# Patient Record
Sex: Male | Born: 1975 | Race: White | Hispanic: No | Marital: Married | Smoking: Former smoker
Health system: Southern US, Community
[De-identification: ages and names within clinical notes are randomized; demographics above are authoritative.]

## PROBLEM LIST (undated history)

## (undated) DIAGNOSIS — I10 Essential (primary) hypertension: Secondary | ICD-10-CM

## (undated) DIAGNOSIS — D649 Anemia, unspecified: Secondary | ICD-10-CM

## (undated) DIAGNOSIS — F411 Generalized anxiety disorder: Secondary | ICD-10-CM

## (undated) DIAGNOSIS — IMO0002 Reserved for concepts with insufficient information to code with codable children: Secondary | ICD-10-CM

## (undated) DIAGNOSIS — H3500 Unspecified background retinopathy: Secondary | ICD-10-CM

## (undated) DIAGNOSIS — E1165 Type 2 diabetes mellitus with hyperglycemia: Secondary | ICD-10-CM

## (undated) DIAGNOSIS — Z8489 Family history of other specified conditions: Secondary | ICD-10-CM

## (undated) DIAGNOSIS — E119 Type 2 diabetes mellitus without complications: Secondary | ICD-10-CM

## (undated) DIAGNOSIS — R519 Headache, unspecified: Secondary | ICD-10-CM

## (undated) DIAGNOSIS — Z992 Dependence on renal dialysis: Secondary | ICD-10-CM

## (undated) DIAGNOSIS — N179 Acute kidney failure, unspecified: Secondary | ICD-10-CM

## (undated) DIAGNOSIS — E785 Hyperlipidemia, unspecified: Secondary | ICD-10-CM

## (undated) DIAGNOSIS — H3530 Unspecified macular degeneration: Secondary | ICD-10-CM

## (undated) DIAGNOSIS — J189 Pneumonia, unspecified organism: Secondary | ICD-10-CM

## (undated) DIAGNOSIS — N186 End stage renal disease: Secondary | ICD-10-CM

## (undated) DIAGNOSIS — K3184 Gastroparesis: Secondary | ICD-10-CM

## (undated) HISTORY — DX: Type 2 diabetes mellitus without complications: E11.9

## (undated) HISTORY — DX: Type 2 diabetes mellitus with hyperglycemia: E11.65

## (undated) HISTORY — DX: Generalized anxiety disorder: F41.1

## (undated) HISTORY — PX: APPENDECTOMY: SHX54

## (undated) HISTORY — DX: Reserved for concepts with insufficient information to code with codable children: IMO0002

## (undated) HISTORY — PX: HERNIA REPAIR: SHX51

## (undated) HISTORY — DX: Hyperlipidemia, unspecified: E78.5

---

## 2018-04-18 ENCOUNTER — Encounter: Payer: Self-pay | Admitting: Physician Assistant

## 2018-04-18 ENCOUNTER — Ambulatory Visit (INDEPENDENT_AMBULATORY_CARE_PROVIDER_SITE_OTHER): Payer: 59 | Admitting: Physician Assistant

## 2018-04-18 ENCOUNTER — Other Ambulatory Visit: Payer: Self-pay

## 2018-04-18 VITALS — BP 138/82 | HR 101 | Temp 98.8°F | Resp 16 | Ht 70.08 in | Wt 180.0 lb

## 2018-04-18 DIAGNOSIS — J069 Acute upper respiratory infection, unspecified: Secondary | ICD-10-CM

## 2018-04-18 MED ORDER — BENZONATATE 100 MG PO CAPS: 100.0000 mg | ORAL_CAPSULE | Freq: Three times a day (TID) | ORAL | 0 refills | Status: DC | PRN

## 2018-04-18 MED ORDER — AZELASTINE HCL 0.1 % NA SOLN: 2.0000 | Freq: Two times a day (BID) | NASAL | 0 refills | Status: DC

## 2018-04-18 MED ORDER — DOXYCYCLINE HYCLATE 100 MG PO CAPS: 100.0000 mg | ORAL_CAPSULE | Freq: Two times a day (BID) | ORAL | 0 refills | Status: DC

## 2018-04-18 MED ORDER — HYDROCODONE-HOMATROPINE 5-1.5 MG/5ML PO SYRP: 5.0000 mL | ORAL_SOLUTION | Freq: Three times a day (TID) | ORAL | 0 refills | Status: DC | PRN

## 2018-04-18 NOTE — Patient Instructions (Addendum)
- We will treat this as a respiratory viral infection.  - I recommend you rest, drink plenty of fluids, eat light meals including soups.  - You may use cough syrup at night for your cough and sore throat, Tessalon pearls during the day. Be aware that cough syrup can definitely make you drowsy and sleepy so do not drive or operate any heavy machinery if it is affecting you during the day.  -You may use nasal spray for congestion, start daily nasal saline rinses (can get this over the counter), and may use sudafed 60mg  twice daily (can get this over the counter) - You may also use Tylenol or ibuprofen over-the-counter for your sore throat. Tea recipe for sore throat: boil water, add 2 inches shaved ginger root, steep 15 minutes, add juice from 2 full lemons, and 2 tbsp honey. -Due to duration, if you are not seeing improvement in 2-3 days, I have given you an antibiotic to start.  While taking Doxycycline:  -Do not drink milk or take iron supplements, multivitamins, calcium supplements, antacids, laxatives within 2 hours before or after taking doxycycline. -Avoid direct exposure to sunlight or tanning beds. Doxycycline can make you sunburn more easily. Wear protective clothing and use sunscreen (SPF 30 or higher) when you are outdoors. -Antibiotic medicines can cause diarrhea, which may be a sign of a new infection. If you have diarrhea that is watery or bloody, stop taking this medicine and seek medical care.  - Please let me know if you are not seeing any improvement or get worse.   Return in 2 weeks for complete physical exam, we will obtain fasting lab work at that visit.   Sinusitis, Adult Sinusitis is soreness and inflammation of your sinuses. Sinuses are hollow spaces in the bones around your face. They are located:  Around your eyes.  In the middle of your forehead.  Behind your nose.  In your cheekbones.  Your sinuses and nasal passages are lined with a stringy fluid (mucus). Mucus  normally drains out of your sinuses. When your nasal tissues get inflamed or swollen, the mucus can get trapped or blocked so air cannot flow through your sinuses. This lets bacteria, viruses, and funguses grow, and that leads to infection. Follow these instructions at home: Medicines  Take, use, or apply over-the-counter and prescription medicines only as told by your doctor. These may include nasal sprays.  If you were prescribed an antibiotic medicine, take it as told by your doctor. Do not stop taking the antibiotic even if you start to feel better. Hydrate and Humidify  Drink enough water to keep your pee (urine) clear or pale yellow.  Use a cool mist humidifier to keep the humidity level in your home above 50%.  Breathe in steam for 10-15 minutes, 3-4 times a day or as told by your doctor. You can do this in the bathroom while a hot shower is running.  Try not to spend time in cool or dry air. Rest  Rest as much as possible.  Sleep with your head raised (elevated).  Make sure to get enough sleep each night. General instructions  Put a warm, moist washcloth on your face 3-4 times a day or as told by your doctor. This will help with discomfort.  Wash your hands often with soap and water. If there is no soap and water, use hand sanitizer.  Do not smoke. Avoid being around people who are smoking (secondhand smoke).  Keep all follow-up visits as told  by your doctor. This is important. Contact a doctor if:  You have a fever.  Your symptoms get worse.  Your symptoms do not get better within 10 days. Get help right away if:  You have a very bad headache.  You cannot stop throwing up (vomiting).  You have pain or swelling around your face or eyes.  You have trouble seeing.  You feel confused.  Your neck is stiff.  You have trouble breathing. This information is not intended to replace advice given to you by your health care provider. Make sure you discuss any  questions you have with your health care provider. Document Released: 03/29/2008 Document Revised: 06/06/2016 Document Reviewed: 08/06/2015 Elsevier Interactive Patient Education  2018 [employer].  Bupropion tablets (Depression/Mood/Smoking Disorders) What is this medicine? BUPROPION (byoo PROE pee on) is used to treat depression. This medicine may be used for other purposes; ask your health care provider or pharmacist if you have questions. COMMON BRAND NAME(S): Wellbutrin What should I tell my health care provider before I take this medicine? They need to know if you have any of these conditions: -an eating disorder, such as anorexia or bulimia -bipolar disorder or psychosis -diabetes or high blood sugar, treated with medication -glaucoma -heart disease, previous heart attack, or irregular heart beat -head injury or brain tumor -high blood pressure -kidney or liver disease -seizures -suicidal thoughts or a previous suicide attempt -Tourette's syndrome -weight loss -an unusual or allergic reaction to bupropion, other medicines, foods, dyes, or preservatives -breast-feeding -pregnant or trying to become pregnant How should I use this medicine? Take this medicine by mouth with a glass of water. Follow the directions on the prescription label. You can take it with or without food. If it upsets your stomach, take it with food. Take your medicine at regular intervals. Do not take your medicine more often than directed. Do not stop taking this medicine suddenly except upon the advice of your doctor. Stopping this medicine too quickly may cause serious side effects or your condition may worsen. A special MedGuide will be given to you by the pharmacist with each prescription and refill. Be sure to read this information carefully each time. Talk to your pediatrician regarding the use of this medicine in children. Special care may be needed. Overdosage: If you think you have taken too much of  this medicine contact a poison control center or emergency room at once. NOTE: This medicine is only for you. Do not share this medicine with others. What if I miss a dose? If you miss a dose, take it as soon as you can. If it is less than four hours to your next dose, take only that dose and skip the missed dose. Do not take double or extra doses. What may interact with this medicine? Do not take this medicine with any of the following medications: -linezolid -MAOIs like Azilect, Carbex, Eldepryl, Marplan, Nardil, and Parnate -methylene blue (injected into a vein) -other medicines that contain bupropion like Zyban This medicine may also interact with the following medications: -alcohol -certain medicines for anxiety or sleep -certain medicines for blood pressure like metoprolol, propranolol -certain medicines for depression or psychotic disturbances -certain medicines for HIV or AIDS like efavirenz, lopinavir, nelfinavir, ritonavir -certain medicines for irregular heart beat like propafenone, flecainide -certain medicines for Parkinson's disease like amantadine, levodopa -certain medicines for seizures like carbamazepine, phenytoin, phenobarbital -cimetidine -clopidogrel -cyclophosphamide -digoxin -furazolidone -isoniazid -nicotine -orphenadrine -procarbazine -steroid medicines like prednisone or cortisone -stimulant medicines for attention  disorders, weight loss, or to stay awake -tamoxifen -theophylline -thiotepa -ticlopidine -tramadol -warfarin This list may not describe all possible interactions. Give your health care provider a list of all the medicines, herbs, non-prescription drugs, or dietary supplements you use. Also tell them if you smoke, drink alcohol, or use illegal drugs. Some items may interact with your medicine. What should I watch for while using this medicine? Tell your doctor if your symptoms do not get better or if they get worse. Visit your doctor or  health care professional for regular checks on your progress. Because it may take several weeks to see the full effects of this medicine, it is important to continue your treatment as prescribed by your doctor. Patients and their families should watch out for new or worsening thoughts of suicide or depression. Also watch out for sudden changes in feelings such as feeling anxious, agitated, panicky, irritable, hostile, aggressive, impulsive, severely restless, overly excited and hyperactive, or not being able to sleep. If this happens, especially at the beginning of treatment or after a change in dose, call your health care professional. Avoid alcoholic drinks while taking this medicine. Drinking excessive alcoholic beverages, using sleeping or anxiety medicines, or quickly stopping the use of these agents while taking this medicine may increase your risk for a seizure. Do not drive or use heavy machinery until you know how this medicine affects you. This medicine can impair your ability to perform these tasks. Do not take this medicine close to bedtime. It may prevent you from sleeping. Your mouth may get dry. Chewing sugarless gum or sucking hard candy, and drinking plenty of water may help. Contact your doctor if the problem does not go away or is severe. What side effects may I notice from receiving this medicine? Side effects that you should report to your doctor or health care professional as soon as possible: -allergic reactions like skin rash, itching or hives, swelling of the face, lips, or tongue -breathing problems -changes in vision -confusion -elevated mood, decreased need for sleep, racing thoughts, impulsive behavior -fast or irregular heartbeat -hallucinations, loss of contact with reality -increased blood pressure -redness, blistering, peeling or loosening of the skin, including inside the mouth -seizures -suicidal thoughts or other mood changes -unusually weak or  tired -vomiting Side effects that usually do not require medical attention (report to your doctor or health care professional if they continue or are bothersome): -constipation -headache -loss of appetite -nausea -tremors -weight loss This list may not describe all possible side effects. Call your doctor for medical advice about side effects. You may report side effects to FDA at 1-800-FDA-1088. Where should I keep my medicine? Keep out of the reach of children. Store at room temperature between 20 and 25 degrees C (68 and 77 degrees F), away from direct sunlight and moisture. Keep tightly closed. Throw away any unused medicine after the expiration date. NOTE: This sheet is a summary. It may not cover all possible information. If you have questions about this medicine, talk to your doctor, pharmacist, or health care provider.  2018 Elsevier/Gold Standard (2016-04-02 13:44:21)  IF you received an x-ray today, you will receive an invoice from [facility]. Please contact [facility] at [phone] with questions or concerns regarding your invoice.   IF you received labwork today, you will receive an invoice from [facility]. Please contact [facility] at [phone] with questions or concerns regarding your invoice.   Our billing staff will not be able to assist you with questions regarding  bills from these companies.  You will be contacted with the lab results as soon as they are available. The fastest way to get your results is to activate your My Chart account. Instructions are located on the last page of this paperwork. If you have not heard from [place] regarding the results in 2 weeks, please contact this office.

## 2018-04-18 NOTE — Progress Notes (Signed)
MRN: [id number] DOB: Jan 06, 1976  Subjective:   Jose [name] is a 42 y.o. male presenting for chief complaint of Establish Care and Sinus Problem (pt states he has been having some nasal and chest congestion x 1 week. ) .  Reports 7 day history of illness. Started out with head cold sx like nasal congestion, sinus congestion, ear fullness, scratchy throat. Then developed chest congestion with cough. Less drainage today compared to yesterday. Had fever, which resolved. Cough is keeping him up at night, cannot get any rest.  Has tried mucinex and dayquil with no full relief. Denies wheezing, shortness of breath, chest pain and myalgia, chills, nausea, vomiting, abdominal pain and diarrhea. Has had sick contact with children. No history of seasonal allergies, no history of asthma or COPD.  Has PMH of bronchitis and sinusitis. Smokes 1/3 ppd x 20 years.  Jose [name] currently has no medications in their medication list. Also has no allergies on file.  Jose [name]  has a past medical history of Diabetes mellitus without complication ([place]). Also  has a past surgical history that includes Appendectomy and Hernia repair.   Objective:   Vitals: BP 138/82   Pulse (!) 101   Temp 98.8 F (37.1 C) (Oral)   Resp 16   Ht 5' 10.08" (1.78 m)   Wt 180 lb (81.6 kg)   SpO2 96%   BMI 25.77 kg/m   Physical Exam  Constitutional: He is oriented to person, place, and time. He appears well-developed and well-nourished. No distress.  HENT:  Head: Normocephalic and atraumatic.  Right Ear: Tympanic membrane, external ear and ear canal normal.  Left Ear: Tympanic membrane, external ear and ear canal normal.  Nose: Mucosal edema (moderate b/l) present. Right sinus exhibits maxillary sinus tenderness (mild). Right sinus exhibits no frontal sinus tenderness. Left sinus exhibits maxillary sinus tenderness (mild). Left sinus exhibits no frontal sinus tenderness.  Mouth/Throat: Uvula is midline and mucous membranes are  normal. Posterior oropharyngeal erythema present. No posterior oropharyngeal edema or tonsillar abscesses. No tonsillar exudate.  Eyes: Conjunctivae are normal.  Neck: Normal range of motion.  Cardiovascular: Normal rate, regular rhythm, normal heart sounds and intact distal pulses.  Pulmonary/Chest: Effort normal and breath sounds normal. No accessory muscle usage. No respiratory distress. He has no decreased breath sounds. He has no wheezes. He has no rhonchi. He has no rales.  Lymphadenopathy:       Head (right side): No submental, no submandibular, no tonsillar, no preauricular, no posterior auricular and no occipital adenopathy present.       Head (left side): No submental, no submandibular, no tonsillar, no preauricular, no posterior auricular and no occipital adenopathy present.    He has no cervical adenopathy.       Right: No supraclavicular adenopathy present.       Left: No supraclavicular adenopathy present.  Neurological: He is alert and oriented to person, place, and time.  Skin: Skin is warm and dry.  Psychiatric: He has a normal mood and affect.  Vitals reviewed.   No results found for this or any previous visit (from the past 24 hour(s)).  Assessment and Plan :  1. Acute upper respiratory infection - Likely viral in etiology d/t reassuring physical exam findings and vitals. Lungs CTAB. Mild sinus pressure noted on exam.  - Advised supportive care, offered symptomatic relief. - due to hx and duration, given abx to cover for both sinus and lung etiology if pt does not have any relief in  sx or they worsen in 3-5 days. Return to clinic as needed. -Recommend f/u when better for CPE.  - benzonatate (TESSALON) 100 MG capsule; Take 1-2 capsules (100-200 mg total) by mouth 3 (three) times daily as needed for cough.  Dispense: 40 capsule; Refill: 0 - HYDROcodone-homatropine (HYCODAN) 5-1.5 MG/5ML syrup; Take 5 mLs by mouth every 8 (eight) hours as needed for cough.  Dispense: 75 mL;  Refill: 0 - azelastine (ASTELIN) 0.1 % nasal spray; Place 2 sprays into both nostrils 2 (two) times daily. Use in each nostril as directed  Dispense: 30 mL; Refill: 0 - doxycycline (VIBRAMYCIN) 100 MG capsule; Take 1 capsule (100 mg total) by mouth 2 (two) times daily.  Dispense: 20 capsule; Refill: 0  Tenna Delaine, PA-C  [facility] 04/18/2018 3:12 PM

## 2018-05-05 ENCOUNTER — Encounter: Payer: Self-pay | Admitting: Physician Assistant

## 2018-05-05 ENCOUNTER — Other Ambulatory Visit: Payer: Self-pay

## 2018-05-05 ENCOUNTER — Ambulatory Visit (INDEPENDENT_AMBULATORY_CARE_PROVIDER_SITE_OTHER): Payer: 59 | Admitting: Physician Assistant

## 2018-05-05 VITALS — BP 120/78 | HR 100 | Temp 98.0°F | Resp 20 | Ht 70.55 in | Wt 179.2 lb

## 2018-05-05 DIAGNOSIS — E119 Type 2 diabetes mellitus without complications: Secondary | ICD-10-CM | POA: Diagnosis not present

## 2018-05-05 DIAGNOSIS — F17210 Nicotine dependence, cigarettes, uncomplicated: Secondary | ICD-10-CM | POA: Diagnosis not present

## 2018-05-05 DIAGNOSIS — Z0001 Encounter for general adult medical examination with abnormal findings: Secondary | ICD-10-CM | POA: Diagnosis not present

## 2018-05-05 DIAGNOSIS — N5314 Retrograde ejaculation: Secondary | ICD-10-CM

## 2018-05-05 DIAGNOSIS — F17200 Nicotine dependence, unspecified, uncomplicated: Secondary | ICD-10-CM

## 2018-05-05 DIAGNOSIS — N529 Male erectile dysfunction, unspecified: Secondary | ICD-10-CM | POA: Diagnosis not present

## 2018-05-05 DIAGNOSIS — Z113 Encounter for screening for infections with a predominantly sexual mode of transmission: Secondary | ICD-10-CM

## 2018-05-05 DIAGNOSIS — Z0000 Encounter for general adult medical examination without abnormal findings: Secondary | ICD-10-CM

## 2018-05-05 NOTE — Patient Instructions (Addendum)
We will contact you with your lab results and discuss further treatment plans. Thank you for letting me participate in your health and well being.   Health Maintenance, Male A healthy lifestyle and preventive care is important for your health and wellness. Ask your health care provider about what schedule of regular examinations is right for you. What should I know about weight and diet? Eat a Healthy Diet  Eat plenty of vegetables, fruits, whole grains, low-fat dairy products, and lean protein.  Do not eat a lot of foods high in solid fats, added sugars, or salt.  Maintain a Healthy Weight Regular exercise can help you achieve or maintain a healthy weight. You should:  Do at least 150 minutes of exercise each week. The exercise should increase your heart rate and make you sweat (moderate-intensity exercise).  Do strength-training exercises at least twice a week.  Watch Your Levels of Cholesterol and Blood Lipids  Have your blood tested for lipids and cholesterol every 5 years starting at 44 years of age. If you are at high risk for heart disease, you should start having your blood tested when you are 44 years old. You may need to have your cholesterol levels checked more often if: ? Your lipid or cholesterol levels are high. ? You are older than 44 years of age. ? You are at high risk for heart disease.  What should I know about cancer screening? Many types of cancers can be detected early and may often be prevented. Lung Cancer  You should be screened every year for lung cancer if: ? You are a current smoker who has smoked for at least 30 years. ? You are a former smoker who has quit within the past 15 years.  Talk to your health care provider about your screening options, when you should start screening, and how often you should be screened.  Colorectal Cancer  Routine colorectal cancer screening usually begins at 44 years of age and should be repeated every 5-10 years until  you are 44 years old. You may need to be screened more often if early forms of precancerous polyps or small growths are found. Your health care provider may recommend screening at an earlier age if you have risk factors for colon cancer.  Your health care provider may recommend using home test kits to check for hidden blood in the stool.  A small camera at the end of a tube can be used to examine your colon (sigmoidoscopy or colonoscopy). This checks for the earliest forms of colorectal cancer.  Prostate and Testicular Cancer  Depending on your age and overall health, your health care provider may do certain tests to screen for prostate and testicular cancer.  Talk to your health care provider about any symptoms or concerns you have about testicular or prostate cancer.  Skin Cancer  Check your skin from head to toe regularly.  Tell your health care provider about any new moles or changes in moles, especially if: ? There is a change in a mole's size, shape, or color. ? You have a mole that is larger than a pencil eraser.  Always use sunscreen. Apply sunscreen liberally and repeat throughout the day.  Protect yourself by wearing long sleeves, pants, a wide-brimmed hat, and sunglasses when outside.  What should I know about heart disease, diabetes, and high blood pressure?  If you are 75-3 years of age, have your blood pressure checked every 3-5 years. If you are 62 years of age or  older, have your blood pressure checked every year. You should have your blood pressure measured twice-once when you are at a hospital or clinic, and once when you are not at a hospital or clinic. Record the average of the two measurements. To check your blood pressure when you are not at a hospital or clinic, you can use: ? An automated blood pressure machine at a pharmacy. ? A home blood pressure monitor.  Talk to your health care provider about your target blood pressure.  If you are between 46-79 years  old, ask your health care provider if you should take aspirin to prevent heart disease.  Have regular diabetes screenings by checking your fasting blood sugar level. ? If you are at a normal weight and have a low risk for diabetes, have this test once every three years after the age of 30. ? If you are overweight and have a high risk for diabetes, consider being tested at a younger age or more often.  A one-time screening for abdominal aortic aneurysm (AAA) by ultrasound is recommended for men aged 89-75 years who are current or former smokers. What should I know about preventing infection? Hepatitis B If you have a higher risk for hepatitis B, you should be screened for this virus. Talk with your health care provider to find out if you are at risk for hepatitis B infection. Hepatitis C Blood testing is recommended for:  Everyone born from [birth year] through 1967.  Anyone with known risk factors for hepatitis C.  Sexually Transmitted Diseases (STDs)  You should be screened each year for STDs including gonorrhea and chlamydia if: ? You are sexually active and are younger than 44 years of age. ? You are older than 44 years of age and your health care provider tells you that you are at risk for this type of infection. ? Your sexual activity has changed since you were last screened and you are at an increased risk for chlamydia or gonorrhea. Ask your health care provider if you are at risk.  Talk with your health care provider about whether you are at high risk of being infected with HIV. Your health care provider may recommend a prescription medicine to help prevent HIV infection.  What else can I do?  Schedule regular health, dental, and eye exams.  Stay current with your vaccines (immunizations).  Do not use any tobacco products, such as cigarettes, chewing tobacco, and e-cigarettes. If you need help quitting, ask your health care provider.  Limit alcohol intake to no more than 2 drinks  per day. One drink equals 12 ounces of beer, 5 ounces of wine, or 1 ounces of hard liquor.  Do not use street drugs.  Do not share needles.  Ask your health care provider for help if you need support or information about quitting drugs.  Tell your health care provider if you often feel depressed.  Tell your health care provider if you have ever been abused or do not feel safe at home. This information is not intended to replace advice given to you by your health care provider. Make sure you discuss any questions you have with your health care provider. Document Released: 04/08/2008 Document Revised: 06/09/2016 Document Reviewed: 07/15/2015 Elsevier Interactive Patient Education  [employer].       What are the benefits of quitting smoking? - Quitting smoking can lower your chances of getting or dying from heart disease, lung disease, kidney failure, infection, or cancer. It can also lower  your chances of getting osteoporosis, a condition that makes your bones weak. Plus, quitting smoking can help your skin look younger and reduce the chances that you will have problems with sex.  Quitting smoking will improve your health no matter how old you are, and no matter how long or how much you have smoked.  What should I do if I want to quit smoking? - The letters in the word "START" can help you remember the steps to take:  S = Set a quit date.  T = Tell family, friends, and the people around you that you plan to quit.  A = Anticipate or plan ahead for the tough times you'll face while quitting.  R = Remove cigarettes and other tobacco products from your home, car, and work.  T = Talk to your doctor about getting help to quit.  How can my doctor or nurse help? - Your doctor or nurse can give you advice on the best way to quit. He or she can also put you in touch with counselors or other people you can call for support. Plus, your doctor or nurse can give you medicines to:  -  Reduce your craving for cigarettes - Reduce the unpleasant symptoms that happen when you stop smoking (called "withdrawal symptoms").  You can also get help from a free phone line (1-800-QUIT-NOW) or go online to [employer].  What are the symptoms of withdrawal? - The symptoms include:  - Trouble sleeping - Being irritable, anxious or restless - Getting frustrated or angry - Having trouble thinking clearly Some people who stop smoking become temporarily depressed. Some of them need treatment for depression, such as counseling or antidepressant medicines. If you get depressed when you quit smoking, tell your doctor or nurse about it.  How do medicines help? - Different medicines work in different ways:  - Nicotine replacement therapy eases withdrawal and reduces your body's craving for nicotine, the main drug found in cigarettes. Non-prescription forms of nicotine replacement include skin patches, lozenges, and gum. Prescription forms include nasal sprays and "puffers" or inhalers. - Bupropion is a prescription medicine that reduces your desire to smoke. This medicine is sold under the brand names Zyban and Wellbutrin. It is also available in a generic version, which is cheaper than brand-name medicines.  - Varenicline (brand name: Chantix) is a prescription medicine that reduces withdrawal symptoms and cigarette cravings. If you think you'd like to take varenicline and you have a history of depression, anxiety, or heart disease, discuss this with your doctor or nurse before taking the medicine. Varenicline can also increase the effects of alcohol in some people. It's a good idea to limit drinking while you're taking it, at least until you know how it affects you. If you take bupropion or varenicline and you have any of the following symptoms, stop taking the medicine and call your doctor or nurse:  ? Become very nervous ? Become depressed ? Start to do strange things ? Think about  killing yourself  How does counseling work? - Counseling can happen during formal office visits or just over the phone. A counselor can help you:  ? Figure out what triggers your smoking and what to do instead ? Overcome cravings ? Figure out what went wrong when you tried to quit before  What works best? - Studies show that people have the best luck at quitting if they take medicines to help them quit and work with a Social worker. It might also be helpful  to combine nicotine replacement with one of the prescription medicines that help people quit. In some cases, it might even make sense to take bupropion and varenicline together.    Will I gain weight if I quit? - Yes, you might gain a few pounds. But quitting smoking will have a much more positive effect on your health than weighing a few pounds more. Plus, you can help prevent some weight gain by being more active and eating less. Taking the medicine bupropion might help control weight gain.  What else can I do to improve my chances of quitting? - You can:  ? Start exercising. ? Stay away from smokers and places that you associate with smoking. If people close to you smoke, ask them to quit with you. ? Keep gum, hard candy, or something to put in your mouth handy. If you get a craving for a cigarette, try one of these instead. ? Don't give up, even if you start smoking again. It takes most people a few tries before they succeed.   Bupropion tablets (Depression/Mood Disorders) What is this medicine? BUPROPION (byoo PROE pee on) is used to treat depression. This medicine may be used for other purposes; ask your health care provider or pharmacist if you have questions. COMMON BRAND NAME(S): Wellbutrin What should I tell my health care provider before I take this medicine? They need to know if you have any of these conditions: -an eating disorder, such as anorexia or bulimia -bipolar disorder or psychosis -diabetes or high blood sugar,  treated with medication -glaucoma -heart disease, previous heart attack, or irregular heart beat -head injury or brain tumor -high blood pressure -kidney or liver disease -seizures -suicidal thoughts or a previous suicide attempt -Tourette's syndrome -weight loss -an unusual or allergic reaction to bupropion, other medicines, foods, dyes, or preservatives -breast-feeding -pregnant or trying to become pregnant How should I use this medicine? Take this medicine by mouth with a glass of water. Follow the directions on the prescription label. You can take it with or without food. If it upsets your stomach, take it with food. Take your medicine at regular intervals. Do not take your medicine more often than directed. Do not stop taking this medicine suddenly except upon the advice of your doctor. Stopping this medicine too quickly may cause serious side effects or your condition may worsen. A special MedGuide will be given to you by the pharmacist with each prescription and refill. Be sure to read this information carefully each time. Talk to your pediatrician regarding the use of this medicine in children. Special care may be needed. Overdosage: If you think you have taken too much of this medicine contact a poison control center or emergency room at once. NOTE: This medicine is only for you. Do not share this medicine with others. What if I miss a dose? If you miss a dose, take it as soon as you can. If it is less than four hours to your next dose, take only that dose and skip the missed dose. Do not take double or extra doses. What may interact with this medicine? Do not take this medicine with any of the following medications: -linezolid -MAOIs like Azilect, Carbex, Eldepryl, Marplan, Nardil, and Parnate -methylene blue (injected into a vein) -other medicines that contain bupropion like Zyban This medicine may also interact with the following medications: -alcohol -certain medicines for  anxiety or sleep -certain medicines for blood pressure like metoprolol, propranolol -certain medicines for depression or psychotic disturbances -certain  medicines for HIV or AIDS like efavirenz, lopinavir, nelfinavir, ritonavir -certain medicines for irregular heart beat like propafenone, flecainide -certain medicines for Parkinson's disease like amantadine, levodopa -certain medicines for seizures like carbamazepine, phenytoin, phenobarbital -cimetidine -clopidogrel -cyclophosphamide -digoxin -furazolidone -isoniazid -nicotine -orphenadrine -procarbazine -steroid medicines like prednisone or cortisone -stimulant medicines for attention disorders, weight loss, or to stay awake -tamoxifen -theophylline -thiotepa -ticlopidine -tramadol -warfarin This list may not describe all possible interactions. Give your health care provider a list of all the medicines, herbs, non-prescription drugs, or dietary supplements you use. Also tell them if you smoke, drink alcohol, or use illegal drugs. Some items may interact with your medicine. What should I watch for while using this medicine? Tell your doctor if your symptoms do not get better or if they get worse. Visit your doctor or health care professional for regular checks on your progress. Because it may take several weeks to see the full effects of this medicine, it is important to continue your treatment as prescribed by your doctor. Patients and their families should watch out for new or worsening thoughts of suicide or depression. Also watch out for sudden changes in feelings such as feeling anxious, agitated, panicky, irritable, hostile, aggressive, impulsive, severely restless, overly excited and hyperactive, or not being able to sleep. If this happens, especially at the beginning of treatment or after a change in dose, call your health care professional. Avoid alcoholic drinks while taking this medicine. Drinking excessive alcoholic  beverages, using sleeping or anxiety medicines, or quickly stopping the use of these agents while taking this medicine may increase your risk for a seizure. Do not drive or use heavy machinery until you know how this medicine affects you. This medicine can impair your ability to perform these tasks. Do not take this medicine close to bedtime. It may prevent you from sleeping. Your mouth may get dry. Chewing sugarless gum or sucking hard candy, and drinking plenty of water may help. Contact your doctor if the problem does not go away or is severe. What side effects may I notice from receiving this medicine? Side effects that you should report to your doctor or health care professional as soon as possible: -allergic reactions like skin rash, itching or hives, swelling of the face, lips, or tongue -breathing problems -changes in vision -confusion -elevated mood, decreased need for sleep, racing thoughts, impulsive behavior -fast or irregular heartbeat -hallucinations, loss of contact with reality -increased blood pressure -redness, blistering, peeling or loosening of the skin, including inside the mouth -seizures -suicidal thoughts or other mood changes -unusually weak or tired -vomiting Side effects that usually do not require medical attention (report to your doctor or health care professional if they continue or are bothersome): -constipation -headache -loss of appetite -nausea -tremors -weight loss This list may not describe all possible side effects. Call your doctor for medical advice about side effects. You may report side effects to FDA at 1-800-FDA-1088. Where should I keep my medicine? Keep out of the reach of children. Store at room temperature between 20 and 25 degrees C (68 and 77 degrees F), away from direct sunlight and moisture. Keep tightly closed. Throw away any unused medicine after the expiration date. NOTE: This sheet is a summary. It may not cover all possible  information. If you have questions about this medicine, talk to your doctor, pharmacist, or health care provider.  2018 Elsevier/Gold Standard (2016-04-02 13:44:21)  Nicotine chewing gum What is this medicine? NICOTINE ([place] teen) helps people stop  smoking. This medicine replaces the nicotine found in cigarettes and helps to decrease withdrawal effects. It is most effective when used in combination with a stop-smoking program. This medicine may be used for other purposes; ask your health care provider or pharmacist if you have questions. COMMON BRAND NAME(S): NICOrelief, Nicorette What should I tell my health care provider before I take this medicine? They need to know if you have any of these conditions: -diabetes -heart disease, angina, irregular heartbeat or previous heart attack -high blood pressure -lung disease, including asthma -overactive thyroid -pheochromocytoma -seizures or history of seizures -stomach problems or ulcers -an unusual or allergic reaction to nicotine, other medicines, foods, dyes, or preservatives -pregnant or trying to get pregnant -breast-feeding How should I use this medicine? Chew but do not swallow the gum. Follow the directions that come with the chewing gum. Use exactly as directed. When you feel an urgent desire for a cigarette, chew one piece of gum slowly. Continue chewing until you taste the gum or feel a slight tingling in your mouth. Then, stop chewing and place the gum between your cheek and gum. Wait until the taste or tingling is almost gone then start chewing again. Continue chewing in this manner for about 30 minutes. Slow chewing helps reduce cravings and also helps reduce the chance for heartburn or other gastrointestinal side effects. Talk to your pediatrician regarding the use of this medicine in children. Special care may be needed. Overdosage: If you think you have taken too much of this medicine contact a poison control center or emergency  room at once. NOTE: This medicine is only for you. Do not share this medicine with others. What if I miss a dose? This does not apply. Only use the chewing gum when you have a strong desire to smoke. Do not use more than one piece of gum at a time. What may interact with this medicine? -medicines for asthma -medicines for blood pressure -medicines for mental depression This list may not describe all possible interactions. Give your health care provider a list of all the medicines, herbs, non-prescription drugs, or dietary supplements you use. Also tell them if you smoke, drink alcohol, or use illegal drugs. Some items may interact with your medicine. What should I watch for while using this medicine? Always carry the nicotine gum with you. Do not use more than 30 pieces of gum a day. Too much gum can increase the risk of an overdose. As the urge to smoke gets less, gradually reduce the number of pieces each day over a period of 2 to 3 months. When you are only using 1 or 2 pieces a day, stop using the nicotine gum. You should begin using the nicotine gum the day you stop smoking. It is okay if you do not succeed with the attempt to quit and have a cigarette. You can still continue your quit attempt and keep using the product as directed. Just throw away your cigarettes and get back to your quit plan. If your mouth gets sore from chewing the gum, suck hard sugarless candy between pieces of gum to help relieve the soreness. Brush your teeth regularly to reduce mouth irritation. If you wear dentures, contact your doctor or health care professional if the gum sticks to your dental work. If you are a diabetic and you quit smoking, the effects of insulin may be increased and you may need to reduce your insulin dose. Check with your doctor or health care professional about how you  should adjust your insulin dose. What side effects may I notice from receiving this medicine? Side effects that you should report  to your doctor or health care professional as soon as possible: -allergic reactions like skin rash, itching or hives, swelling of the face, lips, or tongue -blisters in mouth -breathing problems -changes in hearing -changes in vision -chest pain -cold sweats -confusion -fast, irregular heartbeat -feeling faint or lightheaded, falls -headache -increased saliva -nausea, vomiting -stomach pain -weakness Side effects that usually do not require medical attention (report to your doctor or health care professional if they continue or are bothersome): -diarrhea -dry mouth -hiccups -irritability -nervousness or restlessness -trouble sleeping or vivid dreams This list may not describe all possible side effects. Call your doctor for medical advice about side effects. You may report side effects to FDA at 1-800-FDA-1088. Where should I keep my medicine? Keep out of the reach of children. Store at room temperature between 15 and 30 degrees C (59 and 86 degrees F). Protect from heat and light. Throw away unused medicine after the expiration date. NOTE: This sheet is a summary. It may not cover all possible information. If you have questions about this medicine, talk to your doctor, pharmacist, or health care provider.  2018 Elsevier/Gold Standard (2015-04-07 19:37:14)  Nicotine lozenge What is this medicine? NICOTINE ([place] teen) helps people stop smoking. The lozenges replace the nicotine found in cigarettes and help to decrease withdrawal effects. It is most effective when used in combination with a stop-smoking program. This medicine may be used for other purposes; ask your health care provider or pharmacist if you have questions. COMMON BRAND NAME(S): Commit, NICOrelief, Nicorette What should I tell my health care provider before I take this medicine? They need to know if you have any of these conditions: -diabetes -heart disease, angina, irregular heartbeat or previous heart  attack -high blood pressure -lung disease, including asthma -overactive thyroid -pheochromocytoma -seizures or history of seizures -stomach problems or ulcers -an unusual or allergic reaction to nicotine, other medicines, foods, dyes, or preservatives -pregnant or trying to get pregnant -breast-feeding How should I use this medicine? Place the lozenge in the mouth. Suck on the lozenge until it is completely dissolved. Do not swallow the lozenge. Follow the directions carefully that come with the lozenge. Use exactly as directed. Do not use the lozenges more often than directed. Talk to your pediatrician regarding the use of this medicine in children. Special care may be needed. Overdosage: If you think you have taken too much of this medicine contact a poison control center or emergency room at once. NOTE: This medicine is only for you. Do not share this medicine with others. What if I miss a dose? This does not apply. What may interact with this medicine? -medicines for asthma -medicines for blood pressure -medicines for mental depression This list may not describe all possible interactions. Give your health care provider a list of all the medicines, herbs, non-prescription drugs, or dietary supplements you use. Also tell them if you smoke, drink alcohol, or use illegal drugs. Some items may interact with your medicine. What should I watch for while using this medicine? Always carry the nicotine lozenges with you. You should begin using the nicotine lozenges the day you stop smoking. It is okay if you do not succeed with your attempt to quit and have a cigarette. You can still continue your quit attempt and keep using the product as directed. Just throw away your cigarettes and get back  to your quit plan. If you are a diabetic and you quit smoking, the effects of insulin may be increased and you may need to reduce your insulin dose. Check with your doctor or health care professional about  how you should adjust your insulin dose. Brush your teeth regularly to reduce mouth irritation. What side effects may I notice from receiving this medicine? Side effects that you should report to your doctor or health care professional as soon as possible: -allergic reactions like skin rash, itching or hives, swelling of the face, lips, or tongue -breathing problems -changes in hearing -changes in vision -chest pain -cold sweats -confusion -fast, irregular heartbeat -feeling faint or lightheaded, falls -headache -increased saliva -nausea, vomiting -stomach pain -weakness Side effects that usually do not require medical attention (report to your doctor or health care professional if they continue or are bothersome): -diarrhea -dry mouth -hiccups -irritability -nervousness or restlessness -trouble sleeping or vivid dreams This list may not describe all possible side effects. Call your doctor for medical advice about side effects. You may report side effects to FDA at 1-800-FDA-1088. Where should I keep my medicine? Keep out of the reach of children. Store at room temperature between 15 and 30 degrees C (59 and 86 degrees F). Protect from heat and light. Throw away unused medicine after the expiration date. NOTE: This sheet is a summary. It may not cover all possible information. If you have questions about this medicine, talk to your doctor, pharmacist, or health care provider.  2018 Elsevier/Gold Standard (2015-04-07 19:40:56)  Nicotine inhaler What is this medicine? NICOTINE ([place]) helps people stop smoking. This medicine replaces the nicotine found in cigarettes and helps to decrease withdrawal effects. It is most effective when used in combination with a stop-smoking program. This medicine may be used for other purposes; ask your health care provider or pharmacist if you have questions. COMMON BRAND NAME(S): Nicotrol What should I tell my health care provider before I  take this medicine? They need to know if you have any of these conditions: -diabetes -heart disease, angina, irregular heartbeat or previous heart attack -high blood pressure -lung disease, including asthma -overactive thyroid -pheochromocytoma -seizures or history of seizures -stomach problems or ulcers -an unusual or allergic reaction to nicotine, other medicines, foods, dyes, or preservatives -pregnant or trying to get pregnant -breast-feeding How should I use this medicine? You should stop smoking completely before using the inhaler. Follow the directions carefully. Use exactly as directed. Do not use the inhaler more often than directed. Talk to your pediatrician regarding the use of this medicine in children. Special care may be needed. Overdosage: If you think you have taken too much of this medicine contact a poison control center or emergency room at once. NOTE: This medicine is only for you. Do not share this medicine with others. What if I miss a dose? If you miss a dose, use it as soon as you can. If it is almost time for your next dose, use only that dose. Do not use double or extra doses. What may interact with this medicine? -medicines for asthma -medicines for high blood pressure -medicines for mental depression This list may not describe all possible interactions. Give your health care provider a list of all the medicines, herbs, non-prescription drugs, or dietary supplements you use. Also tell them if you smoke, drink alcohol, or use illegal drugs. Some items may interact with your medicine. What should I watch for while using this medicine? Always carry  the inhaler with you. Do not smoke, chew nicotine gum, or use snuff while you are using this medicine. This reduces the chance of a nicotine overdose. If you are a diabetic and you quit smoking, the effects of insulin may be increased and you may need to reduce your insulin dose. Check with your doctor or health care  professional about how you should adjust your insulin dose. What side effects may I notice from receiving this medicine? Side effects that you should report to your doctor or health care professional as soon as possible: -allergic reactions like skin rash, itching or hives, swelling of the face, lips, or tongue -breathing problems -changes in hearing -changes in vision -chest pain -cold sweats -confusion -fast, irregular heartbeat -feeling faint or lightheaded, falls -headache -increased saliva -nausea, vomiting -stomach pain -weakness Side effects that usually do not require medical attention (report to your doctor or health care professional if they continue or are bothersome): -diarrhea -dry mouth -hiccups -irritability -nervousness or restlessness -trouble sleeping or vivid dreams This list may not describe all possible side effects. Call your doctor for medical advice about side effects. You may report side effects to FDA at 1-800-FDA-1088. Where should I keep my medicine? Keep out of the reach of children. Store at room temperature below 25 degrees C (77 degrees F). Protect from heat and light. Throw away unused medicine after the expiration date. NOTE: This sheet is a summary. It may not cover all possible information. If you have questions about this medicine, talk to your doctor, pharmacist, or health care provider.  2018 Elsevier/Gold Standard (2015-04-07 19:45:36)  IF you received an x-ray today, you will receive an invoice from [facility]. Please contact [facility] at [phone] with questions or concerns regarding your invoice.   IF you received labwork today, you will receive an invoice from [facility]. Please contact [facility] at [phone] with questions or concerns regarding your invoice.   Our billing staff will not be able to assist you with questions regarding bills from these companies.  You will be contacted with the lab results as soon  as they are available. The fastest way to get your results is to activate your My Chart account. Instructions are located on the last page of this paperwork. If you have not heard from [place] regarding the results in 2 weeks, please contact this office.

## 2018-05-05 NOTE — Progress Notes (Signed)
Jose [name]  MRN: [id number] DOB: August 25, 1976  Subjective:  Pt is a 42 y.o. male who presents for annual physical exam. Pt is fasting today.  Diet: power bar, banana, sandwich, burger, meat, vegetable, salads. Drinks mostly water and soda (1 liter of day).  Exercise: walks and some weights  Sleep: ~6 hours  BM: Daily  Last dental exam: 2014, brushes BID Last vision exam: 2017  Vaccinations      Tetanus: 2012      Pneumovax: never  T2DM: Dx at age 7. Diet controlled for most of the years. Started on metformin at age 11. A1C was 11. Got it down to 6ish. Stopped metformin ~4 years ago. Lost ~70lbs. Last had A1C checked in 2015. Denies polyuria, polyphagia, dry mouth, ab pain, blurred vision, N/V/D.   ED: difficulty maintaining and achieving erections since age 49. Has tried cialis in the past which has helped. Also tried androgel in the past with no difference. Since 2013, will ejaculate and no semen production, Cialis did not help with this. Was thought to be due to untreated diabetes.   There are no active problems to display for this patient.   Current Outpatient Medications on File Prior to Visit  Medication Sig Dispense Refill  . doxycycline (VIBRAMYCIN) 100 MG capsule Take 1 capsule (100 mg total) by mouth 2 (two) times daily. 20 capsule 0  . azelastine (ASTELIN) 0.1 % nasal spray Place 2 sprays into both nostrils 2 (two) times daily. Use in each nostril as directed (Patient not taking: Reported on 05/05/2018) 30 mL 0  . benzonatate (TESSALON) 100 MG capsule Take 1-2 capsules (100-200 mg total) by mouth 3 (three) times daily as needed for cough. (Patient not taking: Reported on 05/05/2018) 40 capsule 0  . HYDROcodone-homatropine (HYCODAN) 5-1.5 MG/5ML syrup Take 5 mLs by mouth every 8 (eight) hours as needed for cough. (Patient not taking: Reported on 05/05/2018) 75 mL 0   No current facility-administered medications on file prior to visit.     No Known  Allergies  Social History   Socioeconomic History  . Marital status: Single    Spouse name: Not on file  . Number of children: 2  . Years of education: Not on file  . Highest education level: Not on file  Occupational History  . Not on file  Social Needs  . Financial resource strain: Not on file  . Food insecurity:    Worry: Not on file    Inability: Not on file  . Transportation needs:    Medical: Not on file    Non-medical: Not on file  Tobacco Use  . Smoking status: Current Every Day Smoker    Packs/day: 1.00    Years: 20.00    Pack years: 20.00    Types: Cigarettes  . Smokeless tobacco: Never Used  Substance and Sexual Activity  . Alcohol use: Not Currently  . Drug use: Never  . Sexual activity: Yes  Lifestyle  . Physical activity:    Days per week: Not on file    Minutes per session: Not on file  . Stress: Not on file  Relationships  . Social connections:    Talks on phone: Not on file    Gets together: Not on file    Attends religious service: Not on file    Active member of club or organization: Not on file    Attends meetings of clubs or organizations: Not on file    Relationship status: Not on file  Other Topics Concern  . Not on file  Social History Narrative  . Not on file    Past Surgical History:  Procedure Laterality Date  . APPENDECTOMY    . HERNIA REPAIR      Family History  Problem Relation Age of Onset  . Diabetes Mother   . Hyperlipidemia Mother   . Stroke Mother   . Diabetes Father   . Hyperlipidemia Brother   . Stroke Brother     Review of Systems  Constitutional: Negative for activity change, appetite change, chills, diaphoresis, fatigue, fever and unexpected weight change.  HENT: Negative for congestion, dental problem, drooling, ear discharge, ear pain, facial swelling, hearing loss, mouth sores, nosebleeds, postnasal drip, rhinorrhea, sinus pressure, sinus pain, sneezing, sore throat, tinnitus, trouble swallowing and voice  change.   Eyes: Positive for visual disturbance (decreased vision for a few years). Negative for photophobia, pain, discharge, redness and itching.  Respiratory: Negative for apnea, cough, choking, chest tightness, shortness of breath, wheezing and stridor.   Cardiovascular: Negative for chest pain, palpitations and leg swelling.  Gastrointestinal: Negative for abdominal distention, abdominal pain, anal bleeding, blood in stool, constipation, diarrhea, nausea, rectal pain and vomiting.  Endocrine: Negative for cold intolerance, heat intolerance, polydipsia, polyphagia and polyuria.  Genitourinary: Negative for decreased urine volume, difficulty urinating, discharge, dysuria, enuresis, flank pain, frequency, genital sores, hematuria, penile pain, penile swelling, scrotal swelling, testicular pain and urgency.  Musculoskeletal: Negative for arthralgias, back pain, gait problem, joint swelling, myalgias, neck pain and neck stiffness.  Skin: Negative for color change, pallor, rash and wound.  Allergic/Immunologic: Negative for environmental allergies, food allergies and immunocompromised state.  Neurological: Negative for dizziness, tremors, seizures, syncope, facial asymmetry, speech difficulty, weakness, light-headedness, numbness and headaches.  Hematological: Negative for adenopathy. Does not bruise/bleed easily.  Psychiatric/Behavioral: Positive for decreased concentration. Negative for agitation, behavioral problems, confusion, dysphoric mood, hallucinations, self-injury, sleep disturbance and suicidal ideas. The patient is not nervous/anxious and is not hyperactive.     Objective:  BP 120/78 (BP Location: Left Arm, Patient Position: Sitting, Cuff Size: Normal)   Pulse 100   Temp 98 F (36.7 C) (Oral)   Resp 20   Ht 5' 10.55" (1.792 m)   Wt 179 lb 3.2 oz (81.3 kg)   SpO2 98%   BMI 25.31 kg/m   Physical Exam  Constitutional: He is oriented to person, place, and time. He appears  well-developed and well-nourished. No distress.  HENT:  Head: Normocephalic and atraumatic.  Right Ear: Hearing, tympanic membrane, external ear and ear canal normal.  Left Ear: Hearing, tympanic membrane, external ear and ear canal normal.  Nose: Nose normal.  Mouth/Throat: Uvula is midline, oropharynx is clear and moist and mucous membranes are normal. No oropharyngeal exudate.  Eyes: Pupils are equal, round, and reactive to light. Conjunctivae and EOM are normal.  Neck: Trachea normal and normal range of motion.  Cardiovascular: Normal rate, regular rhythm, normal heart sounds and intact distal pulses.  Pulmonary/Chest: Effort normal and breath sounds normal.  Abdominal: Soft. Normal appearance and bowel sounds are normal.  Musculoskeletal: Normal range of motion.  Lymphadenopathy:       Head (right side): No submental, no submandibular, no tonsillar, no preauricular, no posterior auricular and no occipital adenopathy present.       Head (left side): No submental, no submandibular, no tonsillar, no preauricular, no posterior auricular and no occipital adenopathy present.    He has no cervical adenopathy.       Right:  No supraclavicular adenopathy present.       Left: No supraclavicular adenopathy present.  Neurological: He is alert and oriented to person, place, and time. He has normal strength and normal reflexes.  Skin: Skin is warm and dry.  Vitals reviewed.   Visual Acuity Screening   Right eye Left eye Both eyes  Without correction:     With correction: 20/40 20/40 20/40    EKG shows  with rate of sinus rhythm with rate of 93 bpm. PR and QRS intervals within normal limits. Left atrial enlargement noted. No prior EKG for comparison. Findings presented and discussed with Dr. Tamala Julian.    Assessment and Plan :  Discussed healthy lifestyle, diet, exercise, preventative care, vaccinations, and addressed patient's concerns. Otherwise, plan for specific conditions below.  1. Annual  physical exam Await lab results.  2. Type 2 diabetes mellitus without complication, without long-term current use of insulin ([place]) Will await lab results to initiate medications as pt has not had lab work in years and used to be "diet controlled" diabetic. Labs pending. Will contact pt with lab results and discuss further plan. He is asx at this time.  - CBC with Differential/Platelet - CMP14+EGFR - Lipid panel - TSH - Hemoglobin A1c - Urinalysis, dipstick only - Microalbumin/Creatinine Ratio, Urine - HM Diabetes Foot Exam - Ambulatory referral to Ophthalmology - Microalbumin / creatinine urine ratio  3. Screen for STD (sexually transmitted disease) - GC/Chlamydia Probe Amp - Hepatitis panel, acute - HIV antibody - RPR - Trichomonas vaginalis, RNA  4. Erectile dysfunction, unspecified erectile dysfunction type Due to hx and sx, will refer to urology for further evaluation. Suspect untreated diabetes and smoking may be playing a role, however with retrograde ejaculation, pt warrants further evaluation.  - Testosterone - EKG 12-Lead - Ambulatory referral to Urology  5. Retrograde ejaculation - Ambulatory referral to Urology  6. Tobacco use disorder Discussed biochemical addiction to nicotine Also discussed that there is also a hand-to-mouth component Discussed ways to quit smoking including Zyban, Chantix, and Nicotine replacement options Discuss behavioral modifications and non-pharmacologic options  Discussed smoking cessation hotlines and support groups Pt is in contemplation stage of change Given educational material to read. Pt will let me know which option he thinks is best for him when he is ready.    Tenna Delaine, PA-C  [facility] 05/05/2018 12:14 PM

## 2018-05-06 ENCOUNTER — Encounter: Payer: Self-pay | Admitting: Physician Assistant

## 2018-05-06 LAB — CMP14+EGFR
ALBUMIN: 4.3 g/dL (ref 3.5–5.5)
ALK PHOS: 78 IU/L (ref 39–117)
ALT: 22 IU/L (ref 0–44)
AST: 17 IU/L (ref 0–40)
Albumin/Globulin Ratio: 1.8 (ref 1.2–2.2)
BUN / CREAT RATIO: 21 — AB (ref 9–20)
BUN: 18 mg/dL (ref 6–24)
Bilirubin Total: 0.7 mg/dL (ref 0.0–1.2)
CO2: 23 mmol/L (ref 20–29)
CREATININE: 0.86 mg/dL (ref 0.76–1.27)
Calcium: 9.5 mg/dL (ref 8.7–10.2)
Chloride: 97 mmol/L (ref 96–106)
GFR calc non Af Amer: 105 mL/min/{1.73_m2} (ref >59)
GFR, EST AFRICAN AMERICAN: 122 mL/min/{1.73_m2} (ref >59)
GLUCOSE: 314 mg/dL — AB (ref 65–99)
Globulin, Total: 2.4 g/dL (ref 1.5–4.5)
Potassium: 5.1 mmol/L (ref 3.5–5.2)
Sodium: 135 mmol/L (ref 134–144)
TOTAL PROTEIN: 6.7 g/dL (ref 6.0–8.5)

## 2018-05-06 LAB — CBC WITH DIFFERENTIAL/PLATELET
BASOS ABS: 0 10*3/uL (ref 0.0–0.2)
Basos: 0 %
EOS (ABSOLUTE): 0.2 10*3/uL (ref 0.0–0.4)
Eos: 2 %
HEMOGLOBIN: 13.3 g/dL (ref 13.0–17.7)
Hematocrit: 40.2 % (ref 37.5–51.0)
IMMATURE GRANS (ABS): 0 10*3/uL (ref 0.0–0.1)
Immature Granulocytes: 0 %
LYMPHS: 24 %
Lymphocytes Absolute: 2.4 10*3/uL (ref 0.7–3.1)
MCH: 29 pg (ref 26.6–33.0)
MCHC: 33.1 g/dL (ref 31.5–35.7)
MCV: 88 fL (ref 79–97)
MONOCYTES: 10 %
Monocytes Absolute: 0.9 10*3/uL (ref 0.1–0.9)
Neutrophils Absolute: 6.1 10*3/uL (ref 1.4–7.0)
Neutrophils: 64 %
Platelets: 272 10*3/uL (ref 150–450)
RBC: 4.59 x10E6/uL (ref 4.14–5.80)
RDW: 13.2 % (ref 12.3–15.4)
WBC: 9.7 10*3/uL (ref 3.4–10.8)

## 2018-05-06 LAB — URINALYSIS, DIPSTICK ONLY
Bilirubin, UA: NEGATIVE
KETONES UA: NEGATIVE
LEUKOCYTES UA: NEGATIVE
Nitrite, UA: NEGATIVE
RBC UA: NEGATIVE
SPEC GRAV UA: 1.025 (ref 1.005–1.030)
Urobilinogen, Ur: 0.2 mg/dL (ref 0.2–1.0)
pH, UA: 5.5 (ref 5.0–7.5)

## 2018-05-06 LAB — HEPATITIS PANEL, ACUTE
HEP B C IGM: NEGATIVE
Hep A IgM: NEGATIVE
Hep C Virus Ab: <0.1 s/co ratio (ref 0.0–0.9)
Hepatitis B Surface Ag: NEGATIVE

## 2018-05-06 LAB — LIPID PANEL
CHOLESTEROL TOTAL: 202 mg/dL — AB (ref 100–199)
Chol/HDL Ratio: 4.4 ratio (ref 0.0–5.0)
HDL: 46 mg/dL (ref >39)
LDL CALC: 137 mg/dL — AB (ref 0–99)
Triglycerides: 93 mg/dL (ref 0–149)
VLDL CHOLESTEROL CAL: 19 mg/dL (ref 5–40)

## 2018-05-06 LAB — MICROALBUMIN / CREATININE URINE RATIO
Creatinine, Urine: 97.6 mg/dL
Microalb/Creat Ratio: 501.3 mg/g creat — ABNORMAL HIGH (ref 0.0–30.0)
Microalbumin, Urine: 489.3 ug/mL

## 2018-05-06 LAB — HEMOGLOBIN A1C
Est. average glucose Bld gHb Est-mCnc: 361 mg/dL
HEMOGLOBIN A1C: 14.2 % — AB (ref 4.8–5.6)

## 2018-05-06 LAB — HIV ANTIBODY (ROUTINE TESTING W REFLEX): HIV SCREEN 4TH GENERATION: NONREACTIVE

## 2018-05-06 LAB — RPR: RPR: NONREACTIVE

## 2018-05-06 LAB — TESTOSTERONE: TESTOSTERONE: 402 ng/dL (ref 264–916)

## 2018-05-06 LAB — TSH: TSH: 1.21 u[IU]/mL (ref 0.450–4.500)

## 2018-05-07 LAB — TRICHOMONAS VAGINALIS, PROBE AMP: TRICH VAG BY NAA: NEGATIVE

## 2018-05-07 LAB — GC/CHLAMYDIA PROBE AMP
Chlamydia trachomatis, NAA: NEGATIVE
NEISSERIA GONORRHOEAE BY PCR: NEGATIVE

## 2018-05-10 ENCOUNTER — Telehealth: Payer: Self-pay | Admitting: Physician Assistant

## 2018-05-10 ENCOUNTER — Telehealth: Payer: Self-pay

## 2018-05-10 NOTE — Telephone Encounter (Signed)
Called pt. Per Ms. Wiseman's instructions. Left VM on pt.'s phone number per [facility]

## 2018-05-10 NOTE — Telephone Encounter (Addendum)
-----   Message from Leonie Douglas, PA-C sent at 05/05/2018 12:15 PM EDT ----- Regarding: ekg results Please call pt. No acute findings on EKG. He has mild enlargement of his atria, but we will just keep an eye on this. Repeat EKG in about 6 months. In terms of his erectile issues, we have collected labs today but I am going to go ahead and place a referral to urology for further evaluation. They should contact him in about 2 weeks. Please let me know if he has any other questions.   I called pt and left vm that there was no acute findings on EKG. That he has enlargement of his atria and  we will  keep an eye on this.We would to repeat  EKG in about 6 months. That a referral was placed to see urology for further evaluation. They should contact you in about 2 weeks. Call our office if you have any questions.

## 2018-05-10 NOTE — Telephone Encounter (Signed)
Patient returned call- patient notified of lab results and appointment made to discuss management.

## 2018-05-16 NOTE — Progress Notes (Signed)
MRN: [id number]  Subjective:   Jose [name] is a 42 y.o. male who presents for follow up of Type 2 diabetes mellitus. Dx at age 31. Diet controlled for most of the years. Started on metformin at age 76. A1C was 11. Got it down to 6ish. Stopped metformin ~4 years ago. Came for CPE on 05/05/18 and labs showed A1C of 14. Was encouraged to schedule f/u appointment to discuss medication initiation and diabetes education.  In past was on metformin and glucotrol and tolerated well.   Patient is not checking home blood sugars.Patient denies foot ulcerations, increased appetite, nausea, paresthesia of the feet, polydipsia, polyuria, visual disturbances, vomiting and weight loss. Patient is not checking their feet daily. No foot concerns. Last diabetic eye exam eye exam 2 years ago.    Diet: power bar, banana, sandwich, burger, meat, vegetable, salads. Drinks mostly water and soda (1 liter of day).Exercise: walks and some weights. Known diabetic complications: none  Immunizations: pneumovax: never  Other concerns: Smokes 1 ppd. We have discussed smoking cessation med in the past. He would like to try wellbutrin at this time.   Past Medical History:  Diagnosis Date  . Diabetes mellitus without complication [facility])    Social History   Socioeconomic History  . Marital status: Significant Other    Spouse name: Not on file  . Number of children: 2  . Years of education: Not on file  . Highest education level: Bachelor's degree (e.g., BA, AB, BS)  Occupational History  . Occupation: Artist  Social Needs  . Financial resource strain: Not hard at all  . Food insecurity:    Worry: Never true    Inability: Never true  . Transportation needs:    Medical: No    Non-medical: No  Tobacco Use  . Smoking status: Current Every Day Smoker    Packs/day: 1.00    Years: 20.00    Pack years: 20.00    Types: Cigarettes  . Smokeless tobacco: Never Used  Substance and Sexual Activity   . Alcohol use: Not Currently  . Drug use: Never  . Sexual activity: Yes    Partners: Female    Birth control/protection: None    Comment: with monogamous partner  Lifestyle  . Physical activity:    Days per week: 4 days    Minutes per session: 20 min  . Stress: Not on file  Relationships  . Social connections:    Talks on phone: Once a week    Gets together: Once a week    Attends religious service: Never    Active member of club or organization: No    Attends meetings of clubs or organizations: Never    Relationship status: Living with partner  . Intimate partner violence:    Fear of current or ex partner: No    Emotionally abused: No    Physically abused: No    Forced sexual activity: No  Other Topics Concern  . Not on file  Social History Narrative   Pt has lived majority of life in [place]. Lives at home with partner, 2 kids, 8 cats, and 1 dog.       Objective:   PHYSICAL EXAM BP 131/84 (BP Location: Right Arm, Patient Position: Sitting, Cuff Size: Normal)   Pulse 97   Temp 98.3 F (36.8 C) (Oral)   Resp 20   Ht 5' 10.16" (1.782 m)   Wt 179 lb 6.4 oz (81.4 kg)   SpO2 98%  BMI 25.63 kg/m   Physical Exam  Constitutional: He is oriented to person, place, and time. He appears well-developed and well-nourished. No distress.  HENT:  Head: Normocephalic and atraumatic.  Eyes: Conjunctivae are normal.  Neck: Normal range of motion.  Pulmonary/Chest: Effort normal.  Neurological: He is alert and oriented to person, place, and time.  Skin: Skin is warm and dry.  Psychiatric: He has a normal mood and affect.  Vitals reviewed.   Diabetic Foot Exam - Simple   Simple Foot Form Visual Inspection No deformities, no ulcerations, no other skin breakdown bilaterally:  Yes See comments:  Yes Sensation Testing Intact to touch and monofilament testing bilaterally:  Yes Pulse Check Posterior Tibialis and Dorsalis pulse intact bilaterally:  Yes Comments Right  foot bruise      No results found for this or any previous visit (from the past 24 hour(s)).  Assessment and Plan :  1. Type 2 diabetes mellitus without complication, without long-term current use of insulin ([place]) Discussed complications of uncontrolled T2DM. Rec start dual therapy at this time. Titrate up to 1000mg  BID of metformin and trulicity once weekly. Will also initiate ACE and statin.  Will likely need to titrate trulicity up at follow up visit. Pt encouraged to start checking fasting sugars. Goal is 70-110. He declines diabetic nutrition as he has done this in the past. He is scheduling eye exam. Will hold off on vaccine until f/u appointment in 4 weeks.  - metFORMIN (GLUCOPHAGE) 500 MG tablet; Take 2 tablets (1,000 mg total) by mouth 2 (two) times daily with a meal.  Dispense: 180 tablet; Refill: 3 - lisinopril (PRINIVIL,ZESTRIL) 5 MG tablet; Take 0.5 tablets (2.5 mg total) by mouth daily.  Dispense: 90 tablet; Refill: 1 - Dulaglutide (TRULICITY) 8.84 ZY/6.0YT SOPN; Inject 0.75 mLs into the skin every 7 (seven) days.  Dispense: 4 pen; Refill: 2 - HM Diabetes Foot Exam - Pneumococcal polysaccharide vaccine 23-valent greater than or equal to 2yo subcutaneous/IM; Future  2. Encounter for smoking cessation counseling Will attempt trial of Wellbutrin. Start 150mg  daily x 3 days, then increase to 150mg  BID daily. F/u in 4 weeks for reevaluation.  - buPROPion (WELLBUTRIN SR) 150 MG 12 hr tablet; Take 1 tablet (150 mg total) by mouth 2 (two) times daily.  Dispense: 60 tablet; Refill: 0  3. Tobacco use disorder - buPROPion (WELLBUTRIN SR) 150 MG 12 hr tablet; Take 1 tablet (150 mg total) by mouth 2 (two) times daily.  Dispense: 60 tablet; Refill: 0  4. Hyperlipidemia, unspecified hyperlipidemia type - atorvastatin (LIPITOR) 20 MG tablet; Take 1 tablet (20 mg total) by mouth daily.  Dispense: 90 tablet; Refill: 3  Side effects, risks, benefits, and alternatives of the medications and  treatment plan prescribed today were discussed, and patient expressed understanding of the instructions given. No barriers to understanding were identified. Red flags discussed in detail. Pt expressed understanding regarding what to do in case of emergency/urgent symptoms.  Tenna Delaine, PA-C  [facility] 05/17/2018 11:07 AM

## 2018-05-17 ENCOUNTER — Other Ambulatory Visit: Payer: Self-pay

## 2018-05-17 ENCOUNTER — Encounter: Payer: Self-pay | Admitting: Physician Assistant

## 2018-05-17 ENCOUNTER — Ambulatory Visit (INDEPENDENT_AMBULATORY_CARE_PROVIDER_SITE_OTHER): Payer: 59 | Admitting: Physician Assistant

## 2018-05-17 VITALS — BP 131/84 | HR 97 | Temp 98.3°F | Resp 20 | Ht 70.16 in | Wt 179.4 lb

## 2018-05-17 DIAGNOSIS — E1142 Type 2 diabetes mellitus with diabetic polyneuropathy: Secondary | ICD-10-CM | POA: Insufficient documentation

## 2018-05-17 DIAGNOSIS — E785 Hyperlipidemia, unspecified: Secondary | ICD-10-CM | POA: Diagnosis not present

## 2018-05-17 DIAGNOSIS — Z794 Long term (current) use of insulin: Secondary | ICD-10-CM | POA: Insufficient documentation

## 2018-05-17 DIAGNOSIS — F17200 Nicotine dependence, unspecified, uncomplicated: Secondary | ICD-10-CM | POA: Insufficient documentation

## 2018-05-17 DIAGNOSIS — E1165 Type 2 diabetes mellitus with hyperglycemia: Secondary | ICD-10-CM | POA: Insufficient documentation

## 2018-05-17 DIAGNOSIS — E119 Type 2 diabetes mellitus without complications: Secondary | ICD-10-CM

## 2018-05-17 DIAGNOSIS — Z716 Tobacco abuse counseling: Secondary | ICD-10-CM

## 2018-05-17 DIAGNOSIS — E1139 Type 2 diabetes mellitus with other diabetic ophthalmic complication: Secondary | ICD-10-CM

## 2018-05-17 MED ORDER — LISINOPRIL 5 MG PO TABS: 2.5000 mg | ORAL_TABLET | Freq: Every day | ORAL | 1 refills | Status: DC

## 2018-05-17 MED ORDER — BUPROPION HCL ER (SR) 150 MG PO TB12: 150.0000 mg | ORAL_TABLET | Freq: Two times a day (BID) | ORAL | 0 refills | Status: DC

## 2018-05-17 MED ORDER — METFORMIN HCL 500 MG PO TABS: 1000.0000 mg | ORAL_TABLET | Freq: Two times a day (BID) | ORAL | 3 refills | Status: DC

## 2018-05-17 MED ORDER — DULAGLUTIDE 0.75 MG/0.5ML ~~LOC~~ SOAJ: 0.7500 mL | SUBCUTANEOUS | 2 refills | Status: DC

## 2018-05-17 MED ORDER — ATORVASTATIN CALCIUM 20 MG PO TABS: 20.0000 mg | ORAL_TABLET | Freq: Every day | ORAL | 3 refills | Status: DC

## 2018-05-17 NOTE — Patient Instructions (Addendum)
For diabetes, start metformin daily and trulicity injection once weekly. During week one, you will take 500mg  in the morning with food. During week 2, you will take 500mg  in the morning and 500mg  in the evening with food. During week 3, you will take 1000mg  in the morning and 500mg  in the evening with food. During week 4, you will take 1000mg  in the morning and 1000mg  in the evening with food. At your next visit, we will prescribe 1000mg  tablets and you will take one in the morning and one in the evening with food. Common side effects of metformin include GI upset like nausea and diarrhea, which is why it is important to take with food. Common side effects of trulicty are nausea.   Start checking your blood sugars daily with the glucometer. Check your sugars when you are fasting in the morning before a meal. Your goal is 70-100. Signs of hypoglycemia are lightheadedness, blurred vision, headache, and confusion.   Start lisinopril for kidney protection and lipitor.  For smoking cessation and attention issues, start Wellbutrin 150 mg tablet x 3 days, after 3 days, can increase to 150mg  twice daily. Therapy should begin at least 1 week before target quit date. Target quit dates are generally in the second week of treatment.  Common side effects can include dry mouth, heart racing, sweating, and headache. Please follow up in 1 month for reevaluation.     Diabetes is a very complicated disease...lets simplify it.   An easy way to look at it to understand the complications is if you think of the extra sugar floating in your blood stream as glass shards floating through your blood stream.   Diabetes affects your small vessels first: 1) The glass shards (sugar) scrapes down the tiny blood vessels in your eyes and lead to diabetic retinopathy, the leading cause of blindness in the [place]. Diabetes is the leading cause of newly diagnosed adult (71 to 44 years of age) blindness in the [place].  2) The  glass shards scratches down the tiny vessels of your legs leading to nerve damage called neuropathy and can lead to amputations of your feet. More than 60% of all non-traumatic amputations of lower limbs occur in people with diabetes.  3) Over time the small vessels in your brain are shredded and closed off, individually this does not cause any problems but over a long period of time many of the small vessels being blocked can lead to Vascular Dementia.   4) Your kidney's are a filter system and have a "net" that keeps certain things in the body and lets bad things out. Sugar shreds this net and leads to kidney damage and eventually failure. Decreasing the sugar that is destroying the net and certain blood pressure medications can help stop or decrease progression of kidney disease. Diabetes was the primary cause of kidney failure in 44 percent of all new cases in 2011.  5) Diabetes also destroys the small vessels in your penis that lead to erectile dysfunction. Eventually the vessels are so damaged that you may not be responsive to cialis or viagra.   Diabetes and your large vessels: Your larger vessels consist of your coronary arteries in your heart and the carotid vessels to your brain. Diabetes or even increased sugars put you at 300% increased risk of heart attack and stroke and this is why.. The sugar scrapes down your large blood vessels and your body sees this as an internal injury and tries to repair itself.  Just like you get a scab on your skin, your platelets will stick to the blood vessel wall trying to heal it. This is why we have diabetics on low dose aspirin daily, this prevents the platelets from sticking and can prevent plaque formation. In addition, your body takes cholesterol and tries to shove it into the open wound. This is why we want your LDL, or bad cholesterol, below 70.   The combination of platelets and cholesterol over 5-10 years forms plaque that can break off and cause a  heart attack or stroke.   PLEASE REMEMBER:   Diabetes is preventable! Up to 64 percent of complications and morbidities among individuals with type 2 diabetes can be prevented, delayed, or effectively treated and minimized with regular visits to a health professional, appropriate monitoring and medication, and a healthy diet and lifestyle.  Here is some information to help you keep your heart healthy: Move it! - Aim for 30 mins of activity every day. Take it slowly at first. Talk to [place] before starting any new exercise program.   Lose it.  -Body Mass Index (BMI) can indicate if you need to lose weight. A healthy range is 18.5-24.9. For a BMI calculator, go to [employer]  Waist Management -Excess abdominal fat is a risk factor for heart disease, diabetes, asthma, stroke and more. Ideal waist circumference is less than 35" for women and less than 40" for men.   Eat Right -focus on fruits, vegetables, whole grains, and meals you make yourself. Avoid foods with trans fat and high sugar/sodium content.   Snooze or Snore? - Loud snoring can be a sign of sleep apnea, a significant risk factor for high blood pressure, heart attach, stroke, and heart arrhythmias.  Kick the habit -Quit Smoking! Avoid second hand smoke. A single cigarette raises your blood pressure for 20 mins and increases the risk of heart attack and stroke for the next 24 hours.   Are Aspirin and Supplements right for you? -Add ENTERIC COATED low dose 81 mg Aspirin daily OR can do every other day if you have easy bruising to protect your heart and head. As well as to reduce risk of Colon Cancer by 20 %, Skin Cancer by 26 % , Melanoma by 46% and Pancreatic cancer by 60%  Say "No to Stress -There may be little you can do about problems that cause stress. However, techniques such as long walks, meditation, and exercise can help you manage it.   Start Now! - Make changes one at a time and set reasonable goals to increase your  likelihood of success.       IF you received an x-ray today, you will receive an invoice from [facility]. Please contact [facility] at [phone] with questions or concerns regarding your invoice.   IF you received labwork today, you will receive an invoice from [place]. Please contact [facility] at [phone] with questions or concerns regarding your invoice.   Our billing staff will not be able to assist you with questions regarding bills from these companies.  You will be contacted with the lab results as soon as they are available. The fastest way to get your results is to activate your My Chart account. Instructions are located on the last page of this paperwork. If you have not heard from [place] regarding the results in 2 weeks, please contact this office.

## 2018-06-05 DIAGNOSIS — E113512 Type 2 diabetes mellitus with proliferative diabetic retinopathy with macular edema, left eye: Secondary | ICD-10-CM | POA: Diagnosis not present

## 2018-06-05 LAB — HM DIABETES EYE EXAM

## 2018-07-05 DIAGNOSIS — H3582 Retinal ischemia: Secondary | ICD-10-CM | POA: Diagnosis not present

## 2018-07-05 DIAGNOSIS — E113593 Type 2 diabetes mellitus with proliferative diabetic retinopathy without macular edema, bilateral: Secondary | ICD-10-CM | POA: Diagnosis not present

## 2018-07-05 DIAGNOSIS — H2513 Age-related nuclear cataract, bilateral: Secondary | ICD-10-CM | POA: Diagnosis not present

## 2018-07-05 LAB — HM DIABETES EYE EXAM

## 2018-07-07 ENCOUNTER — Other Ambulatory Visit: Payer: Self-pay | Admitting: Physician Assistant

## 2018-07-07 DIAGNOSIS — E119 Type 2 diabetes mellitus without complications: Secondary | ICD-10-CM

## 2018-07-11 ENCOUNTER — Ambulatory Visit (INDEPENDENT_AMBULATORY_CARE_PROVIDER_SITE_OTHER): Payer: 59 | Admitting: Physician Assistant

## 2018-07-11 ENCOUNTER — Encounter: Payer: Self-pay | Admitting: Physician Assistant

## 2018-07-11 ENCOUNTER — Other Ambulatory Visit: Payer: Self-pay

## 2018-07-11 VITALS — BP 134/70 | HR 111 | Temp 98.0°F | Resp 20 | Ht 70.55 in | Wt 187.0 lb

## 2018-07-11 DIAGNOSIS — E1165 Type 2 diabetes mellitus with hyperglycemia: Secondary | ICD-10-CM | POA: Diagnosis not present

## 2018-07-11 DIAGNOSIS — IMO0002 Reserved for concepts with insufficient information to code with codable children: Secondary | ICD-10-CM

## 2018-07-11 DIAGNOSIS — Z23 Encounter for immunization: Secondary | ICD-10-CM | POA: Diagnosis not present

## 2018-07-11 DIAGNOSIS — E1139 Type 2 diabetes mellitus with other diabetic ophthalmic complication: Secondary | ICD-10-CM | POA: Diagnosis not present

## 2018-07-11 DIAGNOSIS — F17200 Nicotine dependence, unspecified, uncomplicated: Secondary | ICD-10-CM

## 2018-07-11 DIAGNOSIS — E11319 Type 2 diabetes mellitus with unspecified diabetic retinopathy without macular edema: Secondary | ICD-10-CM | POA: Diagnosis not present

## 2018-07-11 LAB — POCT URINALYSIS DIP (MANUAL ENTRY)
BILIRUBIN UA: NEGATIVE
BILIRUBIN UA: NEGATIVE mg/dL
Leukocytes, UA: NEGATIVE
Nitrite, UA: NEGATIVE
Protein Ur, POC: 30 mg/dL — AB
RBC UA: NEGATIVE
SPEC GRAV UA: 1.02 (ref 1.010–1.025)
UROBILINOGEN UA: 0.2 U/dL
pH, UA: 5.5 (ref 5.0–8.0)

## 2018-07-11 MED ORDER — DULAGLUTIDE 1.5 MG/0.5ML ~~LOC~~ SOAJ: 1.5000 mg | SUBCUTANEOUS | 2 refills | Status: DC

## 2018-07-11 MED ORDER — LOSARTAN POTASSIUM 25 MG PO TABS: 25.0000 mg | ORAL_TABLET | Freq: Every day | ORAL | 0 refills | Status: DC

## 2018-07-11 NOTE — Progress Notes (Signed)
MRN: [id number]  Subjective:   Jose [name] is a 42 y.o. male who presents for follow up of Type 2 diabetes mellitus.  Restarted on diabetes medication at last visit on 05/17/2018.  Patient is currently managed with metformin 1000 mg twice daily and Trulicity once weekly.  Has noticed an occasional metallic taste with metformin use.  Otherwise tolerating medications well.  Denies diarrhea, nausea, vomiting.  Does have mild peripheral neuropathy.  Not checking sugars.  No foot concerns.  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  He is starting eye injections next week.Is trying to avoid excess carbs in diet.  Not currently exercising.  Continues to smoke.  Is not interested in quitting at this time due to stress from recent news of his eyes. 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  Denies chest pain, wheezing, shortness of breath, dyspnea on exertion, fever, chills, nausea, vomiting.  Tolerating Lipitor okay.  Social History   Socioeconomic History  . Marital status: Significant Other    Spouse name: Not on file  . Number of children: 2  . Years of education: Not on file  . Highest education level: Bachelor's degree (e.g., BA, AB, BS)  Occupational History  . Occupation: Artist  Social Needs  . Financial resource strain: Not hard at all  . Food insecurity:    Worry: Never true    Inability: Never true  . Transportation needs:    Medical: No    Non-medical: No  Tobacco Use  . Smoking status: Current Every Day Smoker    Packs/day: 1.00    Years: 20.00    Pack years: 20.00    Types: Cigarettes  .  Smokeless tobacco: Never Used  Substance and Sexual Activity  . Alcohol use: Not Currently  . Drug use: Never  . Sexual activity: Yes    Partners: Female    Birth control/protection: None    Comment: with monogamous partner  Lifestyle  . Physical activity:    Days per week: 4 days    Minutes per session: 20 min  . Stress: Not on file  Relationships  . Social connections:    Talks on phone: Once a week    Gets together: Once a week    Attends religious service: Never    Active member of club or organization: No    Attends meetings of clubs or organizations: Never    Relationship status: Living with partner  . Intimate partner violence:    Fear of current or ex partner: No    Emotionally abused: No    Physically abused: No    Forced sexual activity: No  Other Topics Concern  . Not on file  Social History Narrative   Pt has lived majority of life in [place]. Lives at home with partner, 2 kids, 8 cats, and 1 dog.       Objective:   PHYSICAL EXAM BP 134/70   Pulse (!) 111   Temp 98 F (36.7 C) (Oral)   Resp 20   Ht 5' 10.55" (1.792 m)   Wt 187 lb (84.8 kg)   SpO2 98%   BMI 26.41 kg/m   Physical Exam  Constitutional: He is oriented to person, place,  and time. He appears well-developed and well-nourished. No distress.  HENT:  Head: Normocephalic and atraumatic.  Mouth/Throat: Uvula is midline, oropharynx is clear and moist and mucous membranes are normal. No tonsillar exudate.  Eyes: Pupils are equal, round, and reactive to light. Conjunctivae, EOM and lids are normal.  Neck: Normal range of motion.  Cardiovascular: Normal rate, regular rhythm, normal heart sounds and intact distal pulses.  Pulmonary/Chest: Effort normal.  Abdominal: Soft. Normal appearance and bowel sounds are normal. There is no tenderness.  Musculoskeletal:       Right lower leg: He exhibits no edema.       Left lower leg: He exhibits no edema.  Neurological: He is alert and oriented to  person, place, and time.  Skin: Skin is warm and dry.  Psychiatric: He has a normal mood and affect.  Vitals reviewed.    Results for orders placed or performed in visit on 07/11/18 (from the past 24 hour(s))  POCT urinalysis dipstick     Status: Abnormal   Collection Time: 07/11/18  3:18 PM  Result Value Ref Range   Color, UA yellow yellow   Clarity, UA clear clear   Glucose, UA =250 (A) negative mg/dL   Bilirubin, UA negative negative   Ketones, POC UA negative negative mg/dL   Spec Grav, UA 1.020 1.010 - 1.025   Blood, UA negative negative   pH, UA 5.5 5.0 - 8.0   Protein Ur, POC =30 (A) negative mg/dL   Urobilinogen, UA 0.2 0.2 or 1.0 E.U./dL   Nitrite, UA Negative Negative   Leukocytes, UA Negative Negative   Wt Readings from Last 3 Encounters:  07/11/18 187 lb (84.8 kg)  05/17/18 179 lb 6.4 oz (81.4 kg)  05/05/18 179 lb 3.2 oz (81.3 kg)    Assessment and Plan :  1. 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  Recommend continuing with metformin 1000 mg twice daily and increasing Trulicity to 1.5 mg weekly.  Will stop lisinopril due to ACE inhibitor induced cough and start losartan.  Plan to follow-up in 1 month for reevaluation.  Will obtain A1c at this time. - CMP14+EGFR - POCT urinalysis dipstick - Pneumococcal polysaccharide vaccine 23-valent greater than or equal to 2yo subcutaneous/IM - Dulaglutide (TRULICITY) 1.5 HU/8.3FG SOPN; Inject 1.5 mg into the skin once a week.  Dispense: 4 pen; Refill: 2 - losartan (COZAAR) 25 MG tablet; Take 1 tablet (25 mg total) by mouth daily.  Dispense: 90 tablet; Refill: 0  2. Tobacco use disorder Is wanting elevation once daily.  Encouraged smoking cessation.  Patient is in contemplation stage.  Will increase Wellbutrin to twice daily when he is ready to quit.  3. Needs flu shot - Flu Vaccine QUAD 36+ mos IM  Tenna Delaine, PA-C  [facility] at [place] 07/11/2018 3:29 PM

## 2018-07-11 NOTE — Patient Instructions (Addendum)
Stop lisinopril.  Start losartan.  Continue metformin.  New Rx for trulicity is an increased dose.  Follow up in one month for labs.     If you have lab work done today you will be contacted with your lab results within the next 2 weeks.  If you have not heard from [place] then please contact us. The fastest way to get your results is to register for My Chart.   IF you received an x-ray today, you will receive an invoice from [facility]. Please contact [facility] at [phone] with questions or concerns regarding your invoice.   IF you received labwork today, you will receive an invoice from [place]. Please contact [facility] at [phone] with questions or concerns regarding your invoice.   Our billing staff will not be able to assist you with questions regarding bills from these companies.  You will be contacted with the lab results as soon as they are available. The fastest way to get your results is to activate your My Chart account. Instructions are located on the last page of this paperwork. If you have not heard from [place] regarding the results in 2 weeks, please contact this office.

## 2018-07-12 LAB — CMP14+EGFR
A/G RATIO: 1.7 (ref 1.2–2.2)
ALBUMIN: 4.2 g/dL (ref 3.5–5.5)
ALT: 25 IU/L (ref 0–44)
AST: 18 IU/L (ref 0–40)
Alkaline Phosphatase: 66 IU/L (ref 39–117)
BUN / CREAT RATIO: 20 (ref 9–20)
BUN: 22 mg/dL (ref 6–24)
Bilirubin Total: 0.4 mg/dL (ref 0.0–1.2)
CO2: 23 mmol/L (ref 20–29)
Calcium: 9.5 mg/dL (ref 8.7–10.2)
Chloride: 103 mmol/L (ref 96–106)
Creatinine, Ser: 1.1 mg/dL (ref 0.76–1.27)
GFR, EST AFRICAN AMERICAN: 94 mL/min/{1.73_m2} (ref >59)
GFR, EST NON AFRICAN AMERICAN: 81 mL/min/{1.73_m2} (ref >59)
GLOBULIN, TOTAL: 2.5 g/dL (ref 1.5–4.5)
Glucose: 225 mg/dL — ABNORMAL HIGH (ref 65–99)
POTASSIUM: 5.2 mmol/L (ref 3.5–5.2)
SODIUM: 139 mmol/L (ref 134–144)
TOTAL PROTEIN: 6.7 g/dL (ref 6.0–8.5)

## 2018-07-13 DIAGNOSIS — E11319 Type 2 diabetes mellitus with unspecified diabetic retinopathy without macular edema: Secondary | ICD-10-CM | POA: Insufficient documentation

## 2018-07-14 ENCOUNTER — Telehealth: Payer: Self-pay | Admitting: Physician Assistant

## 2018-07-14 NOTE — Telephone Encounter (Signed)
Started PA for the Trulicity - needs additional questions answered about what meds have been tried and failed.  Please advise.  Thank you!   KEY: [id number]

## 2018-07-16 ENCOUNTER — Other Ambulatory Visit: Payer: Self-pay | Admitting: Physician Assistant

## 2018-07-16 DIAGNOSIS — Z716 Tobacco abuse counseling: Secondary | ICD-10-CM

## 2018-07-16 DIAGNOSIS — F17200 Nicotine dependence, unspecified, uncomplicated: Secondary | ICD-10-CM

## 2018-07-17 ENCOUNTER — Encounter: Payer: Self-pay | Admitting: Physician Assistant

## 2018-07-17 MED ORDER — BUPROPION HCL ER (SR) 150 MG PO TB12: 150.0000 mg | ORAL_TABLET | Freq: Two times a day (BID) | ORAL | 0 refills | Status: DC

## 2018-07-18 NOTE — Telephone Encounter (Signed)
Started a new PA as the other had expired due to clinical questions not being answered.   New Key: [id number]  Needs additional questions answered. Meds tried and failed.   Routed to [place] and [employer]

## 2018-07-18 NOTE — Telephone Encounter (Signed)
Resent

## 2018-07-19 NOTE — Telephone Encounter (Signed)
PA is approved. I called pt to advise him to call pharmacy and have it filled.

## 2018-07-26 DIAGNOSIS — E113593 Type 2 diabetes mellitus with proliferative diabetic retinopathy without macular edema, bilateral: Secondary | ICD-10-CM | POA: Diagnosis not present

## 2018-08-03 ENCOUNTER — Telehealth: Payer: Self-pay | Admitting: Physician Assistant

## 2018-08-03 NOTE — Telephone Encounter (Signed)
Copied from [place] [phone]. Topic: General - Other >> Jul 27, 2018  1:12 PM Leward Quan A wrote: Reason for CRM: Patient called to request all Rx sent to [facility] [place], [place] AT [address] [place] [phone] (Phone) [phone] (Fax)  To be written for a 90 day supply due to pricing and insurance preference.

## 2018-08-07 NOTE — Telephone Encounter (Signed)
Pt checking status on all medications being sent to [facility] for a 90 day rx refill. Please advise.

## 2018-08-08 NOTE — Telephone Encounter (Signed)
Called pt and informed that medication has been sent to pharmacy.

## 2018-08-09 ENCOUNTER — Ambulatory Visit: Payer: 59 | Admitting: Physician Assistant

## 2018-08-11 ENCOUNTER — Other Ambulatory Visit: Payer: Self-pay | Admitting: *Deleted

## 2018-08-11 DIAGNOSIS — E119 Type 2 diabetes mellitus without complications: Secondary | ICD-10-CM

## 2018-08-11 MED ORDER — METFORMIN HCL 500 MG PO TABS: 1000.0000 mg | ORAL_TABLET | Freq: Two times a day (BID) | ORAL | 0 refills | Status: DC

## 2018-08-11 NOTE — Telephone Encounter (Signed)
Patient called back he went to the Pharmacy and they stated that they do not have the Rxes sent over also that he is out of metFORMIN (GLUCOPHAGE) 500 MG tablet need Rx today please. Ph# [phone]

## 2018-08-11 NOTE — Telephone Encounter (Signed)
Pt calling to check status. He will be out of medication on Sunday. [facility] is not able to transfer the [facility] from [facility] because it's written for a 45 day supply and pt needs a 90 day supply which is significantly cheaper for him (total of #360 doses for 90 day supply). Called the office and Theadora Rama stated she spoke with Alexander who will address this afternoon. Please call pt to notify.    [facility] [phone] - [place], [place].Marland KitchenMarland Kitchen

## 2018-08-13 NOTE — Telephone Encounter (Signed)
Medication called to [facility]

## 2018-08-23 DIAGNOSIS — E113593 Type 2 diabetes mellitus with proliferative diabetic retinopathy without macular edema, bilateral: Secondary | ICD-10-CM | POA: Diagnosis not present

## 2018-08-23 DIAGNOSIS — H3582 Retinal ischemia: Secondary | ICD-10-CM | POA: Diagnosis not present

## 2018-08-23 DIAGNOSIS — H2513 Age-related nuclear cataract, bilateral: Secondary | ICD-10-CM | POA: Diagnosis not present

## 2018-08-23 LAB — HM DIABETES EYE EXAM

## 2018-08-29 ENCOUNTER — Other Ambulatory Visit: Payer: Self-pay | Admitting: Physician Assistant

## 2018-08-29 DIAGNOSIS — F17200 Nicotine dependence, unspecified, uncomplicated: Secondary | ICD-10-CM

## 2018-08-29 DIAGNOSIS — Z716 Tobacco abuse counseling: Secondary | ICD-10-CM

## 2018-08-29 NOTE — Telephone Encounter (Signed)
Requested Prescriptions  Pending Prescriptions Disp Refills  . 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, without long-term current use of insulin ([place])   [facility] at [place], [place] D, PA-C   3 months ago Type 2 diabetes mellitus without complication, without long-term current use of insulin [facility])   Primary Care at [place], [place] D, PA-C   3 months ago Annual physical exam   Primary Care at [place], [place] D, PA-C   4 months ago Acute upper respiratory infection   Primary Care at [facility], Reather Laurence, PA-C      Future Appointments            In 2 days Leonie Douglas, PA-C Primary Care at [place], [facility]

## 2018-08-31 ENCOUNTER — Ambulatory Visit: Payer: 59 | Admitting: Physician Assistant

## 2018-09-07 ENCOUNTER — Other Ambulatory Visit: Payer: Self-pay

## 2018-09-07 DIAGNOSIS — E1139 Type 2 diabetes mellitus with other diabetic ophthalmic complication: Secondary | ICD-10-CM

## 2018-09-07 DIAGNOSIS — IMO0002 Reserved for concepts with insufficient information to code with codable children: Secondary | ICD-10-CM

## 2018-09-07 DIAGNOSIS — E1165 Type 2 diabetes mellitus with hyperglycemia: Principal | ICD-10-CM

## 2018-09-07 MED ORDER — DULAGLUTIDE 1.5 MG/0.5ML ~~LOC~~ SOAJ: 1.5000 mg | SUBCUTANEOUS | 0 refills | Status: DC

## 2018-09-12 ENCOUNTER — Ambulatory Visit (INDEPENDENT_AMBULATORY_CARE_PROVIDER_SITE_OTHER): Payer: 59 | Admitting: Physician Assistant

## 2018-09-12 ENCOUNTER — Other Ambulatory Visit: Payer: Self-pay

## 2018-09-12 ENCOUNTER — Encounter: Payer: Self-pay | Admitting: Physician Assistant

## 2018-09-12 VITALS — BP 142/82 | HR 109 | Temp 97.6°F | Resp 18 | Ht 70.55 in | Wt 186.0 lb

## 2018-09-12 DIAGNOSIS — E119 Type 2 diabetes mellitus without complications: Secondary | ICD-10-CM | POA: Diagnosis not present

## 2018-09-12 DIAGNOSIS — R030 Elevated blood-pressure reading, without diagnosis of hypertension: Secondary | ICD-10-CM

## 2018-09-12 DIAGNOSIS — R809 Proteinuria, unspecified: Secondary | ICD-10-CM | POA: Diagnosis not present

## 2018-09-12 DIAGNOSIS — E1139 Type 2 diabetes mellitus with other diabetic ophthalmic complication: Secondary | ICD-10-CM | POA: Diagnosis not present

## 2018-09-12 DIAGNOSIS — E1165 Type 2 diabetes mellitus with hyperglycemia: Secondary | ICD-10-CM

## 2018-09-12 DIAGNOSIS — J309 Allergic rhinitis, unspecified: Secondary | ICD-10-CM

## 2018-09-12 LAB — POCT GLYCOSYLATED HEMOGLOBIN (HGB A1C): Hemoglobin A1C: 7.1 % — AB (ref 4.0–5.6)

## 2018-09-12 LAB — POCT URINALYSIS DIP (MANUAL ENTRY)
Bilirubin, UA: NEGATIVE
Blood, UA: NEGATIVE
Glucose, UA: NEGATIVE mg/dL
Ketones, POC UA: NEGATIVE mg/dL
LEUKOCYTES UA: NEGATIVE
NITRITE UA: NEGATIVE
PH UA: 7 (ref 5.0–8.0)
Spec Grav, UA: 1.02 (ref 1.010–1.025)
Urobilinogen, UA: 1 E.U./dL

## 2018-09-12 MED ORDER — HYDROXYZINE HCL 25 MG PO TABS: 12.5000 mg | ORAL_TABLET | Freq: Three times a day (TID) | ORAL | 1 refills | Status: DC | PRN

## 2018-09-12 MED ORDER — LOSARTAN POTASSIUM 25 MG PO TABS: 25.0000 mg | ORAL_TABLET | Freq: Every day | ORAL | 0 refills | Status: DC

## 2018-09-12 MED ORDER — METFORMIN HCL ER 500 MG PO TB24: 2000.0000 mg | ORAL_TABLET | Freq: Every day | ORAL | 1 refills | Status: DC

## 2018-09-12 MED ORDER — DULAGLUTIDE 1.5 MG/0.5ML ~~LOC~~ SOAJ: 1.5000 mg | SUBCUTANEOUS | 0 refills | Status: DC

## 2018-09-12 NOTE — Progress Notes (Signed)
MRN: [id number]  Subjective:   Jose [name] is a 42 y.o. male who presents for follow up of Type 2 diabetes mellitus.  Restarted on diabetes medications in 04/2018.  A1c was 14.2.  Last seen on 06/2018.  Trulicity was increased.  Currently managed with metformin 1000 mg twice daily and Trulicity 1.5 mg injection once weekly.  Will occasionally notice heartburn with metformin.  Pepcid helps.  Has never tried extended release version.  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  Currently receiving eye injections monthly. Following up closely with them. Continues smoking, but decreasing amount daily.  Known diabetic complications: retinopathy and peripheral neuropathy  Patient is checking home blood sugars. Home blood sugar records: BGs range between 130 and 140. Current symptoms include paresthesia of the feet and visual disturbances. Patient denies lower leg swelling, foot ulcerations, increased appetite, nausea, polydipsia, polyuria, vomiting and weight loss. Patient is checking their feet daily. No foot concerns. Diet includes meat, vegetables, occasional fast food. Trying to avoid excess carbs.     Immunizations: Flu vaccine 06/2018, pneumococal vaccine 06/2018  Other concerns: Not checking blood pressure at home.  Continues to take losartan 25 mg daily.  Continues Lipitor 20 mg daily.  Would like Rx for hydroxyzine for nasal congestion.  Has been trying Benadryl with some relief but tried his wife's hydroxyzine worked really well.  No other questions or concerns today.  Patient Active Problem List   Diagnosis Date Noted  . Diabetic retinopathy ([place]) 07/13/2018  . 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  Tobacco use disorder 05/17/2018  . Hyperlipidemia 05/17/2018   Social History   Socioeconomic History  . Marital status: Significant Other    Spouse name: Not on file  . Number of children: 2  . Years of education: Not on file  . Highest education level: Bachelor's degree (e.g., BA, AB, BS)  Occupational History  . Occupation: Artist  Social Needs  . Financial resource strain: Not hard at all  . Food insecurity:    Worry: Never true    Inability: Never true  . Transportation needs:    Medical: No    Non-medical: No  Tobacco Use  . Smoking status: Current Every Day Smoker    Packs/day: 1.00    Years: 20.00    Pack years: 20.00    Types: Cigarettes  . Smokeless tobacco: Never Used  Substance and Sexual Activity  . Alcohol use: Not Currently  . Drug use: Never  . Sexual activity: Yes    Partners: Female    Birth control/protection: None    Comment: with monogamous partner  Lifestyle  . Physical activity:    Days per week: 4 days    Minutes per session: 20 min  . Stress: Not on file  Relationships  . Social connections:    Talks on phone: Once a week    Gets together: Once a week    Attends religious service: Never    Active member of club or organization: No    Attends meetings of clubs or organizations: Never    Relationship status: Living with partner  . Intimate partner violence:    Fear of current or ex partner: No    Emotionally abused: No    Physically abused: No  Forced sexual activity: No  Other Topics Concern  . Not on file  Social History Narrative   Pt has lived majority of life in [place]. Lives at home with partner, 2 kids, 8 cats, and 1 dog.       Objective:   PHYSICAL EXAM BP (!) 143/78   Pulse (!) 109   Temp 97.6 F (36.4 C) (Oral)   Resp 18   Ht 5' 10.55" (1.792 m)   Wt 186 lb (84.4 kg)   SpO2 95%   BMI 26.27 kg/m   Physical Exam  Constitutional: He is oriented to person, place, and time. He appears  well-developed and well-nourished. No distress.  HENT:  Head: Normocephalic and atraumatic.  Mouth/Throat: Uvula is midline, oropharynx is clear and moist and mucous membranes are normal. No tonsillar exudate.  Eyes: Pupils are equal, round, and reactive to light. Conjunctivae, EOM and lids are normal.  Neck: Normal range of motion.  Cardiovascular: Normal rate, regular rhythm, normal heart sounds and intact distal pulses.  Pulmonary/Chest: Effort normal.  Abdominal: Soft. Normal appearance and bowel sounds are normal. There is no tenderness.  Musculoskeletal:       Right lower leg: He exhibits no edema.       Left lower leg: He exhibits no edema.  Neurological: He is alert and oriented to person, place, and time.  Skin: Skin is warm and dry.  Psychiatric: He has a normal mood and affect.  Vitals reviewed.    Results for orders placed or performed in visit on 09/12/18 (from the past 24 hour(s))  POCT glycosylated hemoglobin (Hb A1C)     Status: Abnormal   Collection Time: 09/12/18  8:48 AM  Result Value Ref Range   Hemoglobin A1C 7.1 (A) 4.0 - 5.6 %   HbA1c POC (<> result, manual entry)     HbA1c, POC (prediabetic range)     HbA1c, POC (controlled diabetic range)    POCT urinalysis dipstick     Status: Abnormal   Collection Time: 09/12/18  9:19 AM  Result Value Ref Range   Color, UA yellow yellow   Clarity, UA clear clear   Glucose, UA negative negative mg/dL   Bilirubin, UA negative negative   Ketones, POC UA negative negative mg/dL   Spec Grav, UA 1.020 1.010 - 1.025   Blood, UA negative negative   pH, UA 7.0 5.0 - 8.0   Protein Ur, POC =100 (A) negative mg/dL   Urobilinogen, UA 1.0 0.2 or 1.0 E.U./dL   Nitrite, UA Negative Negative   Leukocytes, UA Negative Negative   BP Readings from Last 3 Encounters:  09/12/18 (!) 143/78  07/11/18 134/70  05/17/18 131/84    Assessment and Plan :  1. Type 2 diabetes mellitus without complication, without long-term current use of  insulin (HCC) Much improved.  A1c has decreased from 14.2 to 7.1 today.  Patient congratulated on lifestyle modifications.  Recommended continue with metformin and Trulicity at this time.  Will change IR to XR version to see if this helps with heartburn.  If not, patient can continue with Pepcid.  Follow-up in 3 months for reevaluation with Dr. Cassell Smiles. - POCT glycosylated hemoglobin (Hb A1C) - POCT urinalysis dipstick - metFORMIN (GLUCOPHAGE XR) 500 MG 24 hr tablet; Take 4 tablets (2,000 mg total) by mouth daily with breakfast.  Dispense: 360 tablet; Refill: 1 - Dulaglutide (TRULICITY) 1.5 EP/3.2RJ SOPN; Inject 1.5 mg into the skin once a week.  Dispense: 12 pen; Refill: 0 -  losartan (COZAAR) 25 MG tablet; Take 1 tablet (25 mg total) by mouth daily.  Dispense: 90 tablet; Refill: 0  2. Proteinuria, unspecified type UA positive for protein,microalbumin/creatinine ratio in July 2019 was 501.  Likely due to sig uncontrolled diabetes that time.  He currently remains asymptomatic.  Last creatinine was stable.  Will recheck today.  Plan to follow-up in 3 months we will repeat UA at that time.  3. Elevated blood-pressure reading without diagnosis of hypertension BP is typically controlled in office.  2 elevated readings in office today.  Recommend checking blood pressure outside office, if consistently elevated greater than 140/90, can increase losartan to 50 mg daily.  Did not increase beyond this dose without being evaluated in office.  Follow-up in 3 months for reevaluation.  4. Allergic rhinitis, unspecified seasonality, unspecified trigger - hydrOXYzine (ATARAX/VISTARIL) 25 MG tablet; Take 0.5-1 tablets (12.5-25 mg total) by mouth every 8 (eight) hours as needed.  Dispense: 30 tablet; Refill: [place], PA-C  Primary [facility] at [place] 09/12/2018 9:24 AM

## 2018-09-12 NOTE — Patient Instructions (Addendum)
  In terms of elevated blood pressure, I would like you to check your blood pressure at least a couple times over the next week outside of the office and document these values. It is best if you check the blood pressure at different times in the day. Your goal is <140/90.  If over this, take losartan 50mg  daily as opposed to 25mg  daily. Otherwise, if normal, continue current dose.    For diabetes, try metformin XR to see if this helps with heartburn. If not, can use pepcid. Continue trulicity.    Follow up in 3 months with Dr. Pamella Pert.   If you have lab work done today you will be contacted with your lab results within the next 2 weeks.  If you have not heard from [place] then please contact us. The fastest way to get your results is to register for My Chart.   IF you received an x-ray today, you will receive an invoice from [facility]. Please contact [facility] at [phone] with questions or concerns regarding your invoice.   IF you received labwork today, you will receive an invoice from [place]. Please contact [facility] at [phone] with questions or concerns regarding your invoice.   Our billing staff will not be able to assist you with questions regarding bills from these companies.  You will be contacted with the lab results as soon as they are available. The fastest way to get your results is to activate your My Chart account. Instructions are located on the last page of this paperwork. If you have not heard from [place] regarding the results in 2 weeks, please contact this office.

## 2018-10-10 ENCOUNTER — Other Ambulatory Visit: Payer: Self-pay | Admitting: Physician Assistant

## 2018-10-10 DIAGNOSIS — E119 Type 2 diabetes mellitus without complications: Secondary | ICD-10-CM

## 2018-10-10 NOTE — Telephone Encounter (Signed)
Called patient regarding the need for refill for his losartan 25 mg. Per pharmacy he never picked up his prescription from Nov 19 th. This was for 90 tabs.  Left message for him to call back and clarify what he needs.

## 2018-10-27 DIAGNOSIS — E113593 Type 2 diabetes mellitus with proliferative diabetic retinopathy without macular edema, bilateral: Secondary | ICD-10-CM | POA: Diagnosis not present

## 2018-10-27 DIAGNOSIS — H2513 Age-related nuclear cataract, bilateral: Secondary | ICD-10-CM | POA: Diagnosis not present

## 2018-10-27 DIAGNOSIS — H3582 Retinal ischemia: Secondary | ICD-10-CM | POA: Diagnosis not present

## 2018-10-27 LAB — HM DIABETES EYE EXAM

## 2018-11-24 DIAGNOSIS — E113591 Type 2 diabetes mellitus with proliferative diabetic retinopathy without macular edema, right eye: Secondary | ICD-10-CM | POA: Diagnosis not present

## 2018-11-27 ENCOUNTER — Other Ambulatory Visit: Payer: Self-pay | Admitting: Physician Assistant

## 2018-11-27 DIAGNOSIS — F17200 Nicotine dependence, unspecified, uncomplicated: Secondary | ICD-10-CM

## 2018-11-27 DIAGNOSIS — Z716 Tobacco abuse counseling: Secondary | ICD-10-CM

## 2018-11-27 NOTE — Telephone Encounter (Signed)
Requested Prescriptions  Pending Prescriptions Disp Refills  . 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, without long-term current use of insulin ([place])   Primary Care at [place], [place] D, PA-C   6 months ago Type 2 diabetes mellitus without complication, without long-term current use of insulin [facility])   Primary Care at [place], [place] D, PA-C   6 months ago Annual physical exam   Primary Care at [place], [place] D, PA-C   7 months ago Acute upper respiratory infection   Primary Care at [facility], Reather Laurence, PA-C      Future Appointments            In 2 weeks Rutherford Guys, MD Primary Care at [place], [facility]

## 2018-12-12 ENCOUNTER — Encounter: Payer: Self-pay | Admitting: Family Medicine

## 2018-12-12 ENCOUNTER — Ambulatory Visit (INDEPENDENT_AMBULATORY_CARE_PROVIDER_SITE_OTHER): Payer: 59 | Admitting: Family Medicine

## 2018-12-12 ENCOUNTER — Other Ambulatory Visit: Payer: Self-pay

## 2018-12-12 VITALS — BP 132/80 | HR 100 | Temp 97.8°F | Ht 70.55 in | Wt 191.8 lb

## 2018-12-12 DIAGNOSIS — G629 Polyneuropathy, unspecified: Secondary | ICD-10-CM

## 2018-12-12 DIAGNOSIS — E118 Type 2 diabetes mellitus with unspecified complications: Secondary | ICD-10-CM

## 2018-12-12 DIAGNOSIS — J309 Allergic rhinitis, unspecified: Secondary | ICD-10-CM | POA: Diagnosis not present

## 2018-12-12 DIAGNOSIS — E785 Hyperlipidemia, unspecified: Secondary | ICD-10-CM

## 2018-12-12 DIAGNOSIS — R030 Elevated blood-pressure reading, without diagnosis of hypertension: Secondary | ICD-10-CM

## 2018-12-12 DIAGNOSIS — E119 Type 2 diabetes mellitus without complications: Secondary | ICD-10-CM | POA: Diagnosis not present

## 2018-12-12 LAB — POCT GLYCOSYLATED HEMOGLOBIN (HGB A1C): Hemoglobin A1C: 7.4 % — AB (ref 4.0–5.6)

## 2018-12-12 MED ORDER — ATORVASTATIN CALCIUM 40 MG PO TABS: 40.0000 mg | ORAL_TABLET | Freq: Every day | ORAL | 3 refills | Status: DC

## 2018-12-12 MED ORDER — GABAPENTIN 300 MG PO CAPS: 300.0000 mg | ORAL_CAPSULE | Freq: Every day | ORAL | 1 refills | Status: DC

## 2018-12-12 MED ORDER — CANAGLIFLOZIN 300 MG PO TABS: 300.0000 mg | ORAL_TABLET | Freq: Every day | ORAL | 3 refills | Status: DC

## 2018-12-12 MED ORDER — HYDROXYZINE HCL 25 MG PO TABS: 12.5000 mg | ORAL_TABLET | Freq: Three times a day (TID) | ORAL | 1 refills | Status: DC | PRN

## 2018-12-12 MED ORDER — ATORVASTATIN CALCIUM 40 MG PO TABS: 20.0000 mg | ORAL_TABLET | Freq: Every day | ORAL | 3 refills | Status: DC

## 2018-12-12 NOTE — Patient Instructions (Signed)
° ° ° °  If you have lab work done today you will be contacted with your lab results within the next 2 weeks.  If you have not heard from us then please contact us. The fastest way to get your results is to register for My Chart. ° ° °IF you received an x-ray today, you will receive an invoice from [facility]. Please contact [facility] at [phone] with questions or concerns regarding your invoice.  ° °IF you received labwork today, you will receive an invoice from [facility]. Please contact [facility] at [phone] with questions or concerns regarding your invoice.  ° °Our billing staff will not be able to assist you with questions regarding bills from these companies. ° °You will be contacted with the lab results as soon as they are available. The fastest way to get your results is to activate your My Chart account. Instructions are located on the last page of this paperwork. If you have not heard from us regarding the results in 2 weeks, please contact this office. °  ° ° ° °
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
## 2018-12-13 LAB — CMP14+EGFR
ALT: 56 IU/L — ABNORMAL HIGH (ref 0–44)
AST: 34 IU/L (ref 0–40)
Albumin/Globulin Ratio: 2.2 (ref 1.2–2.2)
Albumin: 4.4 g/dL (ref 4.0–5.0)
Alkaline Phosphatase: 80 IU/L (ref 39–117)
BUN/Creatinine Ratio: 22 — ABNORMAL HIGH (ref 9–20)
BUN: 22 mg/dL (ref 6–24)
Bilirubin Total: 0.4 mg/dL (ref 0.0–1.2)
CO2: 22 mmol/L (ref 20–29)
Calcium: 9.2 mg/dL (ref 8.7–10.2)
Chloride: 103 mmol/L (ref 96–106)
Creatinine, Ser: 0.98 mg/dL (ref 0.76–1.27)
GFR calc Af Amer: 108 mL/min/{1.73_m2} (ref >59)
GFR calc non Af Amer: 93 mL/min/{1.73_m2} (ref >59)
Globulin, Total: 2 g/dL (ref 1.5–4.5)
Glucose: 148 mg/dL — ABNORMAL HIGH (ref 65–99)
Potassium: 5.4 mmol/L — ABNORMAL HIGH (ref 3.5–5.2)
Sodium: 136 mmol/L (ref 134–144)
Total Protein: 6.4 g/dL (ref 6.0–8.5)

## 2018-12-13 LAB — LIPID PANEL
Chol/HDL Ratio: 2.1 ratio (ref 0.0–5.0)
Cholesterol, Total: 101 mg/dL (ref 100–199)
HDL: 47 mg/dL (ref >39)
LDL Calculated: 40 mg/dL (ref 0–99)
Triglycerides: 72 mg/dL (ref 0–149)
VLDL Cholesterol Cal: 14 mg/dL (ref 5–40)

## 2018-12-13 LAB — TSH: TSH: 1.99 u[IU]/mL (ref 0.450–4.500)

## 2018-12-15 ENCOUNTER — Ambulatory Visit: Payer: Self-pay | Admitting: *Deleted

## 2018-12-15 DIAGNOSIS — E113592 Type 2 diabetes mellitus with proliferative diabetic retinopathy without macular edema, left eye: Secondary | ICD-10-CM | POA: Diagnosis not present

## 2018-12-15 NOTE — Telephone Encounter (Signed)
  Reason for Disposition . Caller has medication question only, adult not sick, and triager answers question  Answer Assessment - Initial Assessment Questions 1. SYMPTOMS: "Do you have any symptoms?"     No 2. SEVERITY: If symptoms are present, ask "Are they mild, moderate or severe?"     no  Protocols used: MEDICATION QUESTION CALL-A-AH

## 2018-12-15 NOTE — Telephone Encounter (Signed)
Contact pt; he would like to know with his new meds prescribed on 12/12/2018 is there some he was to d/c?; reviewed medications and instructions on AVS dated 12/12/2018:  Medication List  As of 12/12/2018 10:37 AM    Atorvastatin Calcium 40 mg Oral Daily    buPROPion HCl 150 MG TAKE 1 TABLET(150 MG) BY MOUTH TWICE DAILY    Canagliflozin 300 mg Oral Daily before breakfast    Dulaglutide 1.5 mg Subcutaneous Weekly    Gabapentin 300-600 mg Oral Daily at bedtime    hydrOXYzine HCl 12.5-25 mg Oral Every 8 hours PRN    Losartan Potassium 25 MG TAKE 1 TABLET(25 MG) BY MOUTH DAILY    metFORMIN HCl 2,000 mg Oral Daily with breakfast     Ranibizumab 0.3 MG/0.05ML Intravitreal     Tadalafil 20 MG TK 1 T PO PO QD PRN FOR ERECTILE DYSFUNCTION     Tobramycin 0.3 % INSTILL 1 DROP IN BOTH EYES 4 TIMES A DAY BEGIN 1 DAY PRIOR TO TREATMENT CONTINUE THE DAY OF TREATMENT AND ONE FULL DAY AFTER TREATMENT   The pt verbalized understanding; will route to office for notification of this encounter; pt seen by Dr Pamella Pert. [place] 12/12/2018

## 2018-12-19 NOTE — Telephone Encounter (Signed)
Looking at last visit I see where you stated  Adding Sglt2 inhibitor. Patient was informed this medication was in addition to but I wanted to touch base with you to be sure this is correct. Patient is at the max dose of metformin and trulicity

## 2018-12-19 NOTE — Telephone Encounter (Signed)
Yes this is true. thanks

## 2018-12-22 DIAGNOSIS — E113593 Type 2 diabetes mellitus with proliferative diabetic retinopathy without macular edema, bilateral: Secondary | ICD-10-CM | POA: Diagnosis not present

## 2018-12-22 DIAGNOSIS — H2513 Age-related nuclear cataract, bilateral: Secondary | ICD-10-CM | POA: Diagnosis not present

## 2018-12-22 DIAGNOSIS — H3582 Retinal ischemia: Secondary | ICD-10-CM | POA: Diagnosis not present

## 2019-02-16 ENCOUNTER — Other Ambulatory Visit: Payer: Self-pay | Admitting: Physician Assistant

## 2019-02-16 DIAGNOSIS — E119 Type 2 diabetes mellitus without complications: Secondary | ICD-10-CM

## 2019-02-16 NOTE — Telephone Encounter (Signed)
Requested Prescriptions  Pending Prescriptions Disp Refills  . TRULICITY 1.5 EC/9.5QH SOPN [Pharmacy Med Name: TRULICITY 1.5MG /0.5ML SDP 4X0.5ML] 12 pen 0    Sig: INJECT 1 DOSE(1.5 MG) INTO THE SKIN ONCE A WEEK     Endocrinology:  Diabetes - GLP-1 Receptor Agonists Passed - 02/16/2019 10:28 AM      Passed - HBA1C is between 0 and 7.9 and within 180 days    Hemoglobin A1C  Date Value Ref Range Status  12/12/2018 7.4 (A) 4.0 - 5.6 % Final   Hgb A1c MFr Bld  Date Value Ref Range Status  05/05/2018 14.2 (H) 4.8 - 5.6 % Final    Comment:             Prediabetes: 5.7 - 6.4          Diabetes: >6.4          Glycemic control for adults with diabetes: <7.0          Passed - Valid encounter within last 6 months    Recent Outpatient Visits          2 months ago DM (diabetes mellitus), type 2 with complications [facility])   Primary Care at [place], Lilia Argue, MD   5 months ago Type 2 diabetes mellitus without complication, without long-term current use of insulin [facility])   Primary Care at [place], [place] D, PA-C   7 months ago 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

## 2019-02-21 ENCOUNTER — Other Ambulatory Visit: Payer: Self-pay | Admitting: Family Medicine

## 2019-02-21 DIAGNOSIS — Z716 Tobacco abuse counseling: Secondary | ICD-10-CM

## 2019-02-21 DIAGNOSIS — E119 Type 2 diabetes mellitus without complications: Secondary | ICD-10-CM

## 2019-02-21 DIAGNOSIS — F17200 Nicotine dependence, unspecified, uncomplicated: Secondary | ICD-10-CM

## 2019-02-21 MED ORDER — LOSARTAN POTASSIUM 25 MG PO TABS: 25.0000 mg | ORAL_TABLET | Freq: Every day | ORAL | 0 refills | Status: DC

## 2019-02-22 ENCOUNTER — Other Ambulatory Visit: Payer: Self-pay | Admitting: Family Medicine

## 2019-02-22 ENCOUNTER — Other Ambulatory Visit: Payer: Self-pay | Admitting: Physician Assistant

## 2019-02-22 ENCOUNTER — Telehealth: Payer: Self-pay | Admitting: Family Medicine

## 2019-02-22 DIAGNOSIS — Z716 Tobacco abuse counseling: Secondary | ICD-10-CM

## 2019-02-22 DIAGNOSIS — E119 Type 2 diabetes mellitus without complications: Secondary | ICD-10-CM

## 2019-02-22 DIAGNOSIS — F17200 Nicotine dependence, unspecified, uncomplicated: Secondary | ICD-10-CM

## 2019-02-22 MED ORDER — BUPROPION HCL ER (SR) 150 MG PO TB12: 150.0000 mg | ORAL_TABLET | Freq: Two times a day (BID) | ORAL | 0 refills | Status: DC

## 2019-02-22 NOTE — Telephone Encounter (Signed)
Requested medication (s) are due for refill today -yes  Requested medication (s) are on the active medication list -yes  Future visit scheduled -yes  Last refill: 09/12/18  Notes to clinic: Patient is current with visit and had continued care with new provider- Rx needs to be updated with current PCP. Meets RF criteria.  Requested Prescriptions  Pending Prescriptions Disp Refills   metFORMIN (GLUCOPHAGE-XR) 500 MG 24 hr tablet [Pharmacy Med Name: METFORMIN ER 500MG 24HR TABS] 360 tablet 1    Sig: TAKE 4 TABLETS(2000 MG) BY MOUTH DAILY WITH BREAKFAST     Endocrinology:  Diabetes - Biguanides Passed - 02/22/2019 10:36 AM      Passed - Cr in normal range and within 360 days    Creatinine, Ser  Date Value Ref Range Status  12/12/2018 0.98 0.76 - 1.27 mg/dL Final         Passed - HBA1C is between 0 and 7.9 and within 180 days    Hemoglobin A1C  Date Value Ref Range Status  12/12/2018 7.4 (A) 4.0 - 5.6 % Final   Hgb A1c MFr Bld  Date Value Ref Range Status  05/05/2018 14.2 (H) 4.8 - 5.6 % Final    Comment:             Prediabetes: 5.7 - 6.4          Diabetes: >6.4          Glycemic control for adults with diabetes: <7.0          Passed - eGFR in normal range and within 360 days    GFR calc Af Amer  Date Value Ref Range Status  12/12/2018 108 >59 mL/min/1.73 Final   GFR calc non Af Amer  Date Value Ref Range Status  12/12/2018 93 >59 mL/min/1.73 Final         Passed - Valid encounter within last 6 months    Recent Outpatient Visits          2 months ago DM (diabetes mellitus), type 2 with complications [facility])   [facility] at [facility], Lilia Argue, MD   5 months ago Type 2 diabetes mellitus without complication, without long-term current use of insulin ([place])   Primary Care at [place], [place] D, PA-C   7 months ago 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, without long-term current use of insulin ([place])   Primary Care at [place], [place] D, PA-C   9 months ago Type 2 diabetes mellitus without complication, without long-term current use of insulin [facility])   Primary Care at [place], [place] D, PA-C   9 months ago Annual physical exam   Primary Care at [place], Reather Laurence, PA-C      Future Appointments            In 2 weeks Rutherford Guys, MD Primary Care at [place], [facility]            Requested Prescriptions  Pending Prescriptions Disp Refills   metFORMIN (GLUCOPHAGE-XR) 500 MG 24 hr tablet [Pharmacy Med Name: METFORMIN ER 500MG 24HR TABS] 360 tablet 1    Sig: TAKE 4 TABLETS(2000 MG) BY MOUTH DAILY WITH BREAKFAST     Endocrinology:  Diabetes - Biguanides Passed - 02/22/2019 10:36 AM      Passed - Cr in normal range and within 360 days    Creatinine, Ser  Date Value Ref Range Status  12/12/2018 0.98 0.76 - 1.27 mg/dL Final  Passed - HBA1C is between 0 and 7.9 and within 180 days    Hemoglobin A1C  Date Value Ref Range Status  12/12/2018 7.4 (A) 4.0 - 5.6 % Final   Hgb A1c MFr Bld  Date Value Ref Range Status  05/05/2018 14.2 (H) 4.8 - 5.6 % Final    Comment:             Prediabetes: 5.7 - 6.4          Diabetes: >6.4          Glycemic control for adults with diabetes: <7.0          Passed - eGFR in normal range and within 360 days    GFR calc Af Amer  Date Value Ref Range Status  12/12/2018 108 >59 mL/min/1.73 Final   GFR calc non Af Amer  Date Value Ref Range Status  12/12/2018 93 >59 mL/min/1.73 Final         Passed - Valid encounter within last 6 months    Recent Outpatient Visits          2 months ago DM (diabetes mellitus), type 2 with complications [facility])   Primary Care at [facility], Lilia Argue, MD   5 months ago Type 2 diabetes mellitus without complication, without long-term current use of insulin [facility])   Primary Care at [place], [place] D, PA-C   7 months ago 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, [facility]

## 2019-02-23 ENCOUNTER — Other Ambulatory Visit: Payer: Self-pay | Admitting: Physician Assistant

## 2019-02-23 DIAGNOSIS — E119 Type 2 diabetes mellitus without complications: Secondary | ICD-10-CM

## 2019-02-23 NOTE — Telephone Encounter (Signed)
Requested medication (s) are due for refill today: yes  Requested medication (s) are on the active medication list: yes  Last refill: 02/16/19 and ended same day?  Future visit scheduled: yes  Notes to clinic: . Medication is on med list. Pharmacy does not have most recent order .     Requested Prescriptions  Pending Prescriptions Disp Refills   TRULICITY 1.5 KU/5.7DY SOPN [Pharmacy Med Name: TRULICITY 1.5MG /0.5ML SDP 4X0.5ML] 6 mL     Sig: INJECT 1 DOSE(1.5 MG) INTO THE SKIN ONCE A WEEK     Endocrinology:  Diabetes - GLP-1 Receptor Agonists Passed - 02/23/2019 11:51 AM      Passed - HBA1C is between 0 and 7.9 and within 180 days    Hemoglobin A1C  Date Value Ref Range Status  12/12/2018 7.4 (A) 4.0 - 5.6 % Final   Hgb A1c MFr Bld  Date Value Ref Range Status  05/05/2018 14.2 (H) 4.8 - 5.6 % Final    Comment:             Prediabetes: 5.7 - 6.4          Diabetes: >6.4          Glycemic control for adults with diabetes: <7.0          Passed - Valid encounter within last 6 months    Recent Outpatient Visits          2 months ago DM (diabetes mellitus), type 2 with complications [facility])   Primary Care at [facility], Lilia Argue, MD   5 months ago Type 2 diabetes mellitus without complication, without long-term current use of insulin [facility])   Primary Care at [place], [place] D, PA-C   7 months ago 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)   Primary Care at [place], [place] D, PA-C   9 months ago Type 2 diabetes mellitus without complication, without long-term current use of insulin [facility])   Primary Care at [place], [place] D, PA-C   9 months ago Annual physical exam   Primary Care at [facility], Reather Laurence, PA-C      Future Appointments            In 2 weeks Rutherford Guys, MD Primary Care at [place], [facility]

## 2019-02-28 ENCOUNTER — Other Ambulatory Visit: Payer: Self-pay

## 2019-02-28 DIAGNOSIS — F17200 Nicotine dependence, unspecified, uncomplicated: Secondary | ICD-10-CM

## 2019-02-28 DIAGNOSIS — Z716 Tobacco abuse counseling: Secondary | ICD-10-CM

## 2019-02-28 DIAGNOSIS — E113593 Type 2 diabetes mellitus with proliferative diabetic retinopathy without macular edema, bilateral: Secondary | ICD-10-CM | POA: Diagnosis not present

## 2019-02-28 MED ORDER — BUPROPION HCL ER (SR) 150 MG PO TB12: 150.0000 mg | ORAL_TABLET | Freq: Two times a day (BID) | ORAL | 0 refills | Status: DC

## 2019-02-28 NOTE — Telephone Encounter (Signed)
60 tablets were sent to the pharmacy but the Pt needs a 90 day supply Rx sent to the pharmacy so that his insurance will cover the cost / please advise

## 2019-02-28 NOTE — Telephone Encounter (Signed)
Corrected Rx and resent.

## 2019-03-12 ENCOUNTER — Other Ambulatory Visit: Payer: Self-pay

## 2019-03-12 ENCOUNTER — Telehealth (INDEPENDENT_AMBULATORY_CARE_PROVIDER_SITE_OTHER): Payer: 59 | Admitting: Family Medicine

## 2019-03-12 DIAGNOSIS — E785 Hyperlipidemia, unspecified: Secondary | ICD-10-CM

## 2019-03-12 DIAGNOSIS — G629 Polyneuropathy, unspecified: Secondary | ICD-10-CM

## 2019-03-12 DIAGNOSIS — E11319 Type 2 diabetes mellitus with unspecified diabetic retinopathy without macular edema: Secondary | ICD-10-CM

## 2019-03-12 DIAGNOSIS — F411 Generalized anxiety disorder: Secondary | ICD-10-CM

## 2019-03-12 MED ORDER — DULOXETINE HCL 30 MG PO CPEP: 30.0000 mg | ORAL_CAPSULE | Freq: Every day | ORAL | 3 refills | Status: DC

## 2019-03-12 MED ORDER — ATORVASTATIN CALCIUM 40 MG PO TABS: 40.0000 mg | ORAL_TABLET | Freq: Every day | ORAL | 3 refills | Status: DC

## 2019-03-12 MED ORDER — GABAPENTIN 300 MG PO CAPS: 300.0000 mg | ORAL_CAPSULE | Freq: Every day | ORAL | 0 refills | Status: DC

## 2019-03-12 NOTE — Progress Notes (Signed)
Follow up on chronic conditions, has a concerns about the last medication that was given to him. Read that it has a high potential of amputation. He has not been taking that medication due to cost as well. He is not sure if he is taking the invokana, not sure if that is the medication that has that high rate Also, having anxiety, completed anxiety form scoring 17. Takes Wellbutrin daily.

## 2019-03-12 NOTE — Progress Notes (Signed)
Virtual Visit Note  I connected with patient on 03/12/19 at 851am by phone and verified that I am speaking with the correct person using two identifiers. Jose [name] is currently located at home and patient is currently with them during visit. The provider, Rutherford Guys, MD is located in their office at time of visit.  I discussed the limitations, risks, security and privacy concerns of performing an evaluation and management service by telephone and the availability of in person appointments. I also discussed with the patient that there may be a patient responsible charge related to this service. The patient expressed understanding and agreed to proceed.   CC: routine followup  HPI ? Patient is a 43 y.o. male with past medical history significant for DM2 with retinopathy, microalbuminuria and neuropathy, HLP, HTN, tob use who presents today for routine followup  Last OV Feb 2020 a1c 7.4, previous 7.1 Added invokana - unable to afford, worried about side effects, has not been taking it Increased atorvastatin - has been tolerating well Added gabapentin for neuropathy - tolerating well, much improvement in pain Checking cbgs, fasting and 2PP and bedtime, 100-170, avg 140 Highest cbg 248 (after lunch), in the 200s rarely He reports highest cbgs are between lunch and dinner Reports occ lows Most recent episode was related to increased spacing between meals Used to have recurrent lows on glipizide lucentis now q 10 weeks instead to q 4 weeks  Having increased anxiety given current covid 19 Reports baseline anxiety Takes wellbutrin for depression Would be interested in medication Feels that anxiety is leading to avoidance   No Known Allergies  Prior to Admission medications   Medication Sig Start Date End Date Taking? Authorizing Provider  atorvastatin (LIPITOR) 40 MG tablet Take 1 tablet (40 mg total) by mouth daily. 12/12/18   Rutherford Guys, MD  buPROPion [facility]  SR) 150 MG 12 hr tablet Take 1 tablet (150 mg total) by mouth 2 (two) times daily. 02/28/19   Rutherford Guys, MD  canagliflozin (INVOKANA) 300 MG TABS tablet Take 1 tablet (300 mg total) by mouth daily before breakfast. 12/12/18   Rutherford Guys, MD  gabapentin (NEURONTIN) 300 MG capsule Take 1-2 capsules (300-600 mg total) by mouth at bedtime. 12/12/18   Rutherford Guys, MD  hydrOXYzine (ATARAX/VISTARIL) 25 MG tablet Take 0.5-1 tablets (12.5-25 mg total) by mouth every 8 (eight) hours as needed. 12/12/18   Rutherford Guys, MD  losartan (COZAAR) 25 MG tablet Take 1 tablet (25 mg total) by mouth daily. 02/21/19   Rutherford Guys, MD  metFORMIN (GLUCOPHAGE-XR) 500 MG 24 hr tablet TAKE 4 TABLETS(2000 MG) BY MOUTH DAILY WITH BREAKFAST 02/22/19   Rutherford Guys, MD  Ranibizumab (LUCENTIS) 0.3 MG/0.05ML SOLN by Intravitreal route.    [provider]  tadalafil (ADCIRCA/CIALIS) 20 MG tablet TK 1 T PO  PO QD PRN FOR ERECTILE DYSFUNCTION 07/07/18   [provider]  tobramycin (TOBREX) 0.3 % ophthalmic solution INSTILL 1 DROP IN BOTH EYES 4 TIMES A DAY BEGIN 1 DAY  PRIOR TO TREATMENT CONTINUE THE DAY OF TREATMENT AND ONE FULL DAY AFTER TREATMENT 07/05/18   [provider]  TRULICITY 1.5 JO/8.4ZY SOPN INJECT 1 DOSE(1.5 MG) INTO THE SKIN ONCE A WEEK 02/23/19   Rutherford Guys, MD    Past Medical History:  Diagnosis Date  . Diabetes mellitus without complication [facility])     Past Surgical History:  Procedure Laterality Date  . APPENDECTOMY    .  HERNIA REPAIR      Social History   Tobacco Use  . Smoking status: Current Every Day Smoker    Packs/day: 1.00    Years: 20.00    Pack years: 20.00    Types: Cigarettes  . Smokeless tobacco: Never Used  Substance Use Topics  . Alcohol use: Not Currently    Family History  Problem Relation Age of Onset  . Diabetes Mother   . Hyperlipidemia Mother   . Stroke Mother   . Diabetes Father   . Hyperlipidemia Brother   . Stroke  Brother     ROS Per hpi  Objective  Vitals as reported by the patient: none   ASSESSMENT and PLAN  1. Type 2 diabetes mellitus with retinopathy, without long-term current use of insulin, macular edema presence unspecified, unspecified laterality, unspecified retinopathy severity (HCC) Last a1c above goal. Rechecking again. D/c invokana due cost and se concerns. Consider glimperide if need to add another medication. Continue with LFM.  - Hemoglobin A1c; Future - TSH; Future - CMP14+EGFR; Future  2. Hyperlipidemia, unspecified hyperlipidemia type Tolerating higher dose of atorvastatin. Rechecking labs.  - TSH; Future - CMP14+EGFR; Future - atorvastatin (LIPITOR) 40 MG tablet; Take 1 tablet (40 mg total) by mouth daily.  3. Neuropathy Doing well on gabapentin.  4. GAD (generalized anxiety disorder) New diagnosis. Discussed treatment options, discussed trial of duloxetine, might also allow to d/c gabapentin. Reviewed med r/se/b  Other orders - DULoxetine (CYMBALTA) 30 MG capsule; Take 1 capsule (30 mg total) by mouth daily. - gabapentin (NEURONTIN) 300 MG capsule; Take 1-2 capsules (300-600 mg total) by mouth at bedtime.  FOLLOW-UP: labs within 1 week, followup in 4 weeks for anxiety   The above assessment and management plan was discussed with the patient. The patient verbalized understanding of and has agreed to the management plan. Patient is aware to call the clinic if symptoms persist or worsen. Patient is aware when to return to the clinic for a follow-up visit. Patient educated on when it is appropriate to go to the emergency department.    I provided 24 minutes of non-face-to-face time during this encounter.  Rutherford Guys, MD Primary Care at [place], [place] [address] Ph.  [phone] Fax [phone]

## 2019-03-15 ENCOUNTER — Other Ambulatory Visit: Payer: Self-pay

## 2019-03-15 ENCOUNTER — Ambulatory Visit (INDEPENDENT_AMBULATORY_CARE_PROVIDER_SITE_OTHER): Payer: 59 | Admitting: Family Medicine

## 2019-03-15 DIAGNOSIS — E785 Hyperlipidemia, unspecified: Secondary | ICD-10-CM

## 2019-03-15 DIAGNOSIS — E11319 Type 2 diabetes mellitus with unspecified diabetic retinopathy without macular edema: Secondary | ICD-10-CM

## 2019-03-16 LAB — CMP14+EGFR
ALT: 40 IU/L (ref 0–44)
AST: 22 IU/L (ref 0–40)
Albumin/Globulin Ratio: 2.1 (ref 1.2–2.2)
Albumin: 4.2 g/dL (ref 4.0–5.0)
Alkaline Phosphatase: 89 IU/L (ref 39–117)
BUN/Creatinine Ratio: 19 (ref 9–20)
BUN: 22 mg/dL (ref 6–24)
Bilirubin Total: 0.3 mg/dL (ref 0.0–1.2)
CO2: 24 mmol/L (ref 20–29)
Calcium: 8.8 mg/dL (ref 8.7–10.2)
Chloride: 103 mmol/L (ref 96–106)
Creatinine, Ser: 1.13 mg/dL (ref 0.76–1.27)
GFR calc Af Amer: 90 mL/min/{1.73_m2} (ref >59)
GFR calc non Af Amer: 78 mL/min/{1.73_m2} (ref >59)
Globulin, Total: 2 g/dL (ref 1.5–4.5)
Glucose: 215 mg/dL — ABNORMAL HIGH (ref 65–99)
Potassium: 5 mmol/L (ref 3.5–5.2)
Sodium: 139 mmol/L (ref 134–144)
Total Protein: 6.2 g/dL (ref 6.0–8.5)

## 2019-03-16 LAB — HEMOGLOBIN A1C
Est. average glucose Bld gHb Est-mCnc: 209 mg/dL
Hgb A1c MFr Bld: 8.9 % — ABNORMAL HIGH (ref 4.8–5.6)

## 2019-03-16 LAB — TSH: TSH: 3.07 u[IU]/mL (ref 0.450–4.500)

## 2019-03-21 MED ORDER — GLIMEPIRIDE 4 MG PO TABS: 4.0000 mg | ORAL_TABLET | Freq: Every day | ORAL | 3 refills | Status: DC

## 2019-04-12 ENCOUNTER — Encounter: Payer: Self-pay | Admitting: Family Medicine

## 2019-04-12 ENCOUNTER — Ambulatory Visit (INDEPENDENT_AMBULATORY_CARE_PROVIDER_SITE_OTHER): Payer: 59 | Admitting: Family Medicine

## 2019-04-12 ENCOUNTER — Other Ambulatory Visit: Payer: Self-pay

## 2019-04-12 VITALS — BP 152/91 | HR 97 | Temp 97.8°F | Ht 70.55 in | Wt 198.8 lb

## 2019-04-12 DIAGNOSIS — E1165 Type 2 diabetes mellitus with hyperglycemia: Secondary | ICD-10-CM

## 2019-04-12 DIAGNOSIS — G629 Polyneuropathy, unspecified: Secondary | ICD-10-CM | POA: Diagnosis not present

## 2019-04-12 DIAGNOSIS — F411 Generalized anxiety disorder: Secondary | ICD-10-CM

## 2019-04-12 DIAGNOSIS — E1139 Type 2 diabetes mellitus with other diabetic ophthalmic complication: Secondary | ICD-10-CM | POA: Diagnosis not present

## 2019-04-12 DIAGNOSIS — IMO0002 Reserved for concepts with insufficient information to code with codable children: Secondary | ICD-10-CM

## 2019-04-12 MED ORDER — DULOXETINE HCL 60 MG PO CPEP: 60.0000 mg | ORAL_CAPSULE | Freq: Every day | ORAL | 1 refills | Status: DC

## 2019-04-12 MED ORDER — GABAPENTIN 300 MG PO CAPS: 300.0000 mg | ORAL_CAPSULE | Freq: Every day | ORAL | 0 refills | Status: DC

## 2019-04-12 NOTE — Progress Notes (Signed)
6/18/20209:01 AM  Jose [name] July 04, 1976, 43 y.o., male [id number]  Chief Complaint  Patient presents with  . Anxiety    not sure if he is be takinng wellbutrin, not taking the hydroxyzine, did not take     HPI:   Patient is a 43 y.o. male with past medical history significant for DM2 with retinopathy, microalbuminuria and neuropathy, HLP, HTN, tob use  who presents today for anxiety  Last OV May 2020   Added glimperide 4mg  once a day, tolerating well Checking cbgs randomly during the day, 130-200 Denies any lows Takes metformin XR 1000mg  AM and bedtime  Started cymbalta for anxiety and neuropathic pain - tolerating well Long standing, worse during past months, noticing more now that he has returned to work Starting on wellbutrin for smoking cessation and anxiety Patient has noticed that has had more difficulty with concentration but reflux has resolved  Has been taking gabapetin 300mg  at bedtime  PHQ9 and GAD7 noted  Fall Risk  04/12/2019 12/12/2018 09/12/2018 07/11/2018 05/17/2018  Falls in the past year? 0 0 0 No No  Number falls in past yr: 0 0 - - -  Injury with Fall? 0 0 - - -     Depression screen [facility] 2/9 04/12/2019 09/12/2018 07/11/2018  Decreased Interest 1 0 0  Down, Depressed, Hopeless 1 0 0  PHQ - 2 Score 2 0 0  Altered sleeping 2 - -  Tired, decreased energy 2 - -  Change in appetite 1 - -  Feeling bad or failure about yourself  1 - -  Trouble concentrating 2 - -  Moving slowly or fidgety/restless 1 - -  Suicidal thoughts 0 - -  PHQ-9 Score 11 - -  Difficult doing work/chores Somewhat difficult - -   GAD 7 : Generalized Anxiety Score 04/12/2019  Nervous, Anxious, on Edge 3  Control/stop worrying 2  Worry too much - different things 3  Trouble relaxing 3  Restless 2  Easily annoyed or irritable 3  Afraid - awful might happen 3  Total GAD 7 Score 19     No Known Allergies  Prior to Admission medications   Medication Sig Start Date End Date  Taking? Authorizing Provider  atorvastatin (LIPITOR) 40 MG tablet Take 1 tablet (40 mg total) by mouth daily. 03/12/19  Yes Rutherford Guys, MD  DULoxetine (CYMBALTA) 30 MG capsule Take 1 capsule (30 mg total) by mouth daily. 03/12/19  Yes Rutherford Guys, MD  gabapentin (NEURONTIN) 300 MG capsule Take 1-2 capsules (300-600 mg total) by mouth at bedtime. 03/12/19  Yes Rutherford Guys, MD  glimepiride (AMARYL) 4 MG tablet Take 1 tablet (4 mg total) by mouth daily before breakfast. 03/21/19  Yes Rutherford Guys, MD  losartan (COZAAR) 25 MG tablet Take 1 tablet (25 mg total) by mouth daily. 02/21/19  Yes Rutherford Guys, MD  metFORMIN (GLUCOPHAGE-XR) 500 MG 24 hr tablet TAKE 4 TABLETS(2000 MG) BY MOUTH DAILY WITH BREAKFAST 02/22/19  Yes Rutherford Guys, MD  Ranibizumab (LUCENTIS) 0.3 MG/0.05ML SOLN by Intravitreal route.   Yes [provider]  tadalafil (ADCIRCA/CIALIS) 20 MG tablet TK 1 T PO  PO QD PRN FOR ERECTILE DYSFUNCTION 07/07/18  Yes [provider]  tobramycin (TOBREX) 0.3 % ophthalmic solution INSTILL 1 DROP IN BOTH EYES 4 TIMES A DAY BEGIN 1 DAY  PRIOR TO TREATMENT CONTINUE THE DAY OF TREATMENT AND ONE FULL DAY AFTER TREATMENT 07/05/18  Yes [provider]  TRULICITY  1.5 MG/0.5ML SOPN INJECT 1 DOSE(1.5 MG) INTO THE SKIN ONCE A WEEK 02/23/19  Yes Rutherford Guys, MD  buPROPion [facility] SR) 150 MG 12 hr tablet Take 1 tablet (150 mg total) by mouth 2 (two) times daily. Patient not taking: Reported on 04/12/2019 02/28/19   Rutherford Guys, MD  hydrOXYzine (ATARAX/VISTARIL) 25 MG tablet Take 0.5-1 tablets (12.5-25 mg total) by mouth every 8 (eight) hours as needed. Patient not taking: Reported on 04/12/2019 12/12/18   Rutherford Guys, MD    Past Medical History:  Diagnosis Date  . Diabetes mellitus without complication [facility])     Past Surgical History:  Procedure Laterality Date  . APPENDECTOMY    . HERNIA REPAIR      Social History   Tobacco Use  . Smoking  status: Current Every Day Smoker    Packs/day: 1.00    Years: 20.00    Pack years: 20.00    Types: Cigarettes  . Smokeless tobacco: Never Used  Substance Use Topics  . Alcohol use: Not Currently    Family History  Problem Relation Age of Onset  . Diabetes Mother   . Hyperlipidemia Mother   . Stroke Mother   . Diabetes Father   . Hyperlipidemia Brother   . Stroke Brother     ROS Per hpi  OBJECTIVE:  TLXBW'I OMBTDH   04/12/19 0856  BP: (!) 152/91  Pulse: 97  Temp: 97.8 F (36.6 C)  TempSrc: Oral  SpO2: 100%  Weight: 198 lb 12.8 oz (90.2 kg)  Height: 5' 10.55" (1.792 m)   Body mass index is 28.08 kg/m.  BP Readings from Last 3 Encounters:  04/12/19 (!) 152/91  12/12/18 132/80  09/12/18 (!) 142/82   Has not taken BP meds yet this morning  Physical Exam Vitals signs and nursing note reviewed.  Constitutional:      Appearance: He is well-developed.  HENT:     Head: Normocephalic and atraumatic.  Eyes:     Conjunctiva/sclera: Conjunctivae normal.     Pupils: Pupils are equal, round, and reactive to light.  Neck:     Musculoskeletal: Neck supple.  Pulmonary:     Effort: Pulmonary effort is normal.  Skin:    General: Skin is warm and dry.  Neurological:     Mental Status: He is alert and oriented to person, place, and time.     ASSESSMENT and PLAN  1. GAD (generalized anxiety disorder) Not controlled. Increasing cymbalta.   2. Neuropathy Improved. Discussed trial off gabapentin once on new dose of cymbalta for a month.  3. 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 Tolerating glimperide. Discussed LFM. Consider increasing losartan if + urine micro - Microalbumin / creatinine urine ratio  Other orders - DULoxetine (CYMBALTA) 60 MG capsule; Take 1 capsule (60 mg total) by mouth daily. - gabapentin (NEURONTIN) 300 MG capsule; Take 1  capsule (300 mg total) by mouth at bedtime.  Return in about 4 weeks (around 05/10/2019).    Rutherford Guys, MD Primary Care at [place], [place] [address] Ph.  [phone] Fax [phone]

## 2019-04-12 NOTE — Patient Instructions (Addendum)
    Alternate blood glucose checking: one day fasting (before breakfast) and next day 2 hours after dinner  Take metformin before breakfast and dinner  Increasing cymbalta to 60mg , after a month, stop gabapentin and lets see how neuropatic pain, sleep and anxiety are doing  Lets hold off on wellbutrin for now: consider adding if having issues with concentration, depression or need of smoking cessation  If you have lab work done today you will be contacted with your lab results within the next 2 weeks.  If you have not heard from [place] then please contact us. The fastest way to get your results is to register for My Chart.   IF you received an x-ray today, you will receive an invoice from [facility]. Please contact [facility] at [phone] with questions or concerns regarding your invoice.   IF you received labwork today, you will receive an invoice from [place]. Please contact [facility] at [phone] with questions or concerns regarding your invoice.   Our billing staff will not be able to assist you with questions regarding bills from these companies.  You will be contacted with the lab results as soon as they are available. The fastest way to get your results is to activate your My Chart account. Instructions are located on the last page of this paperwork. If you have not heard from [place] regarding the results in 2 weeks, please contact this office.

## 2019-04-13 ENCOUNTER — Other Ambulatory Visit: Payer: Self-pay | Admitting: Family Medicine

## 2019-04-13 DIAGNOSIS — E119 Type 2 diabetes mellitus without complications: Secondary | ICD-10-CM

## 2019-04-13 LAB — MICROALBUMIN / CREATININE URINE RATIO
Creatinine, Urine: 98.7 mg/dL
Microalb/Creat Ratio: 471 mg/g creat — ABNORMAL HIGH (ref 0–29)
Microalbumin, Urine: 465.3 ug/mL

## 2019-04-13 MED ORDER — LOSARTAN POTASSIUM 50 MG PO TABS: 50.0000 mg | ORAL_TABLET | Freq: Every day | ORAL | 1 refills | Status: DC

## 2019-04-26 NOTE — Progress Notes (Signed)
Labs only visit

## 2019-05-10 ENCOUNTER — Other Ambulatory Visit: Payer: Self-pay

## 2019-05-10 ENCOUNTER — Ambulatory Visit (INDEPENDENT_AMBULATORY_CARE_PROVIDER_SITE_OTHER): Payer: 59 | Admitting: Family Medicine

## 2019-05-10 ENCOUNTER — Encounter: Payer: Self-pay | Admitting: Family Medicine

## 2019-05-10 VITALS — BP 135/83 | HR 97 | Temp 98.5°F | Ht 70.55 in | Wt 203.0 lb

## 2019-05-10 DIAGNOSIS — R41840 Attention and concentration deficit: Secondary | ICD-10-CM

## 2019-05-10 DIAGNOSIS — F411 Generalized anxiety disorder: Secondary | ICD-10-CM | POA: Diagnosis not present

## 2019-05-10 DIAGNOSIS — G629 Polyneuropathy, unspecified: Secondary | ICD-10-CM | POA: Diagnosis not present

## 2019-05-10 DIAGNOSIS — IMO0002 Reserved for concepts with insufficient information to code with codable children: Secondary | ICD-10-CM

## 2019-05-10 DIAGNOSIS — E1139 Type 2 diabetes mellitus with other diabetic ophthalmic complication: Secondary | ICD-10-CM

## 2019-05-10 DIAGNOSIS — E1165 Type 2 diabetes mellitus with hyperglycemia: Secondary | ICD-10-CM

## 2019-05-10 DIAGNOSIS — M65321 Trigger finger, right index finger: Secondary | ICD-10-CM

## 2019-05-10 DIAGNOSIS — Z716 Tobacco abuse counseling: Secondary | ICD-10-CM

## 2019-05-10 NOTE — Progress Notes (Signed)
7/16/20209:54 AM  Jose [name] 26-Oct-1975, 42 y.o., male [id number]  Chief Complaint  Patient presents with  . Hypertension    follow up for losartan increase  . Anxiety    follow up for increase of cymbalta    HPI:   Patient is a 43 y.o. male with past medical history significant for DM2 with neuropathy, retinopathy and microalbuminuria, HLP, anxiety, tobacco use who presents today for routine follouwp  Last OV June 2020 Increased cymbalta Increased losartan due to microalbuminuria  Tolerating higher dose of losartan wo issues Denies any fatigue, headaches, dizziness, cough  Anxiety is doing better on higher dose of cymbalta overall was doing better but this last week had increase in panic attacks, driven by continued issues with focus both at home and work, difficulty completing tasks, prioritizing Has never been diagnosed with ADD  Has not started wellbutrin yet Thinking about quit date  cymbalta has helped with neuropathic pain Still taking gabapentin  Having issues with trigger finger of right hand 2nd finger Right handed  Has gained some weight since he started glimperide needs new glucometer Checks 2hours after eating, avg 170 Denies any lows Just got a stationary bike last week  Wt Readings from Last 3 Encounters:  05/10/19 203 lb (92.1 kg)  04/12/19 198 lb 12.8 oz (90.2 kg)  12/12/18 191 lb 12.8 oz (87 kg)    GAD 7 : Generalized Anxiety Score 05/10/2019 04/12/2019  Nervous, Anxious, on Edge 2 3  Control/stop worrying 1 2  Worry too much - different things 2 3  Trouble relaxing 2 3  Restless 1 2  Easily annoyed or irritable 2 3  Afraid - awful might happen 1 3  Total GAD 7 Score 11 19  Anxiety Difficulty Somewhat difficult -     Depression screen [facility] 2/9 05/10/2019 04/12/2019 09/12/2018  Decreased Interest 0 1 0  Down, Depressed, Hopeless 0 1 0  PHQ - 2 Score 0 2 0  Altered sleeping - 2 -  Tired, decreased energy - 2 -  Change in appetite  - 1 -  Feeling bad or failure about yourself  - 1 -  Trouble concentrating - 2 -  Moving slowly or fidgety/restless - 1 -  Suicidal thoughts - 0 -  PHQ-9 Score - 11 -  Difficult doing work/chores - Somewhat difficult -    Fall Risk  05/10/2019 04/12/2019 12/12/2018 09/12/2018 07/11/2018  Falls in the past year? 0 0 0 0 No  Number falls in past yr: 0 0 0 - -  Injury with Fall? 0 0 0 - -     No Known Allergies  Prior to Admission medications   Medication Sig Start Date End Date Taking? Authorizing Provider  atorvastatin (LIPITOR) 40 MG tablet Take 1 tablet (40 mg total) by mouth daily. 03/12/19  Yes Rutherford Guys, MD  DULoxetine (CYMBALTA) 60 MG capsule Take 1 capsule (60 mg total) by mouth daily. 04/12/19  Yes Rutherford Guys, MD  gabapentin (NEURONTIN) 300 MG capsule Take 1 capsule (300 mg total) by mouth at bedtime. 04/12/19  Yes Rutherford Guys, MD  glimepiride (AMARYL) 4 MG tablet Take 1 tablet (4 mg total) by mouth daily before breakfast. 03/21/19  Yes Rutherford Guys, MD  losartan (COZAAR) 50 MG tablet Take 1 tablet (50 mg total) by mouth daily. 04/13/19  Yes Rutherford Guys, MD  metFORMIN (GLUCOPHAGE-XR) 500 MG 24 hr tablet TAKE 4 TABLETS(2000 MG) BY MOUTH DAILY WITH BREAKFAST 02/22/19  Yes Rutherford Guys, MD  Ranibizumab (LUCENTIS) 0.3 MG/0.05ML SOLN by Intravitreal route.   Yes [provider]  tadalafil (ADCIRCA/CIALIS) 20 MG tablet TK 1 T PO  PO QD PRN FOR ERECTILE DYSFUNCTION 07/07/18  Yes [provider]  tobramycin (TOBREX) 0.3 % ophthalmic solution INSTILL 1 DROP IN BOTH EYES 4 TIMES A DAY BEGIN 1 DAY  PRIOR TO TREATMENT CONTINUE THE DAY OF TREATMENT AND ONE FULL DAY AFTER TREATMENT 07/05/18  Yes [provider]  TRULICITY 1.5 WJ/1.9JY SOPN INJECT 1 DOSE(1.5 MG) INTO THE SKIN ONCE A WEEK 02/23/19  Yes Rutherford Guys, MD    Past Medical History:  Diagnosis Date  . GAD (generalized anxiety disorder)   . Hyperlipidemia   . Type II diabetes  mellitus with complication, uncontrolled (HCC)    retinopathy, neuropathy, microalbuminuria    Past Surgical History:  Procedure Laterality Date  . APPENDECTOMY    . HERNIA REPAIR      Social History   Tobacco Use  . Smoking status: Current Every Day Smoker    Packs/day: 1.00    Years: 20.00    Pack years: 20.00    Types: Cigarettes  . Smokeless tobacco: Never Used  Substance Use Topics  . Alcohol use: Not Currently    Family History  Problem Relation Age of Onset  . Diabetes Mother   . Hyperlipidemia Mother   . Stroke Mother   . Diabetes Father   . Hyperlipidemia Brother   . Stroke Brother     ROS Per hpi  OBJECTIVE:  NWGNF'A OZHYQM   05/10/19 0948  BP: 135/83  Pulse: 97  Temp: 98.5 F (36.9 C)  TempSrc: Oral  SpO2: 96%  Weight: 203 lb (92.1 kg)  Height: 5' 10.55" (1.792 m)   Body mass index is 28.68 kg/m.   Physical Exam Vitals signs and nursing note reviewed.  Constitutional:      Appearance: He is well-developed.  HENT:     Head: Normocephalic and atraumatic.  Eyes:     Conjunctiva/sclera: Conjunctivae normal.     Pupils: Pupils are equal, round, and reactive to light.  Neck:     Musculoskeletal: Neck supple.  Pulmonary:     Effort: Pulmonary effort is normal.  Skin:    General: Skin is warm and dry.  Neurological:     Mental Status: He is alert and oriented to person, place, and time.     ASSESSMENT and PLAN  1. GAD (generalized anxiety disorder) Improved. No changes to medication. Referring to psych as wondering if component of undiagnosed ADD.  - Ambulatory referral to Psychiatry  2. Neuropathy Improved. Cont cymbalta and gabapentin  3. Encounter for smoking cessation counseling Discussed quit date, start wellbutrin, discussed coping strategies.   4. Poor concentration Maybe from uncontrolled anxiety vs undiagnosed ADD. wellbutrin might help is ADD. Referring to psych.  - Ambulatory referral to Psychiatry  5. Trigger  index finger of right hand - Ambulatory referral to Hand Surgery  6. 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 Discussed weight gain and glimepiride, discussed LFM.  Return in about 2 months (around 07/11/2019).    Rutherford Guys, MD [facility], [place] [address] Ph.  [phone] Fax [phone]

## 2019-05-10 NOTE — Patient Instructions (Signed)
° ° ° °  If you have lab work done today you will be contacted with your lab results within the next 2 weeks.  If you have not heard from us then please contact us. The fastest way to get your results is to register for My Chart. ° ° °IF you received an x-ray today, you will receive an invoice from [facility]. Please contact [facility] at [phone] with questions or concerns regarding your invoice.  ° °IF you received labwork today, you will receive an invoice from [facility]. Please contact [facility] at [phone] with questions or concerns regarding your invoice.  ° °Our billing staff will not be able to assist you with questions regarding bills from these companies. ° °You will be contacted with the lab results as soon as they are available. The fastest way to get your results is to activate your My Chart account. Instructions are located on the last page of this paperwork. If you have not heard from us regarding the results in 2 weeks, please contact this office. °  ° ° ° °

## 2019-05-14 ENCOUNTER — Ambulatory Visit (INDEPENDENT_AMBULATORY_CARE_PROVIDER_SITE_OTHER): Payer: 59

## 2019-05-14 ENCOUNTER — Ambulatory Visit (INDEPENDENT_AMBULATORY_CARE_PROVIDER_SITE_OTHER): Payer: 59 | Admitting: Emergency Medicine

## 2019-05-14 ENCOUNTER — Encounter: Payer: Self-pay | Admitting: Emergency Medicine

## 2019-05-14 ENCOUNTER — Other Ambulatory Visit: Payer: Self-pay

## 2019-05-14 VITALS — BP 176/92 | HR 105 | Temp 98.5°F | Resp 18 | Ht 70.0 in | Wt 209.0 lb

## 2019-05-14 DIAGNOSIS — S300XXA Contusion of lower back and pelvis, initial encounter: Secondary | ICD-10-CM

## 2019-05-14 DIAGNOSIS — M62838 Other muscle spasm: Secondary | ICD-10-CM

## 2019-05-14 DIAGNOSIS — M545 Low back pain, unspecified: Secondary | ICD-10-CM

## 2019-05-14 IMAGING — DX LUMBAR SPINE - COMPLETE 4+ VIEW
5 series · 5 of 5 positions shown · non-contrast
Comparison: No prior.

CLINICAL DATA: Trauma.

EXAM:
LUMBAR SPINE - COMPLETE 4+ VIEW

[l-spine ap]
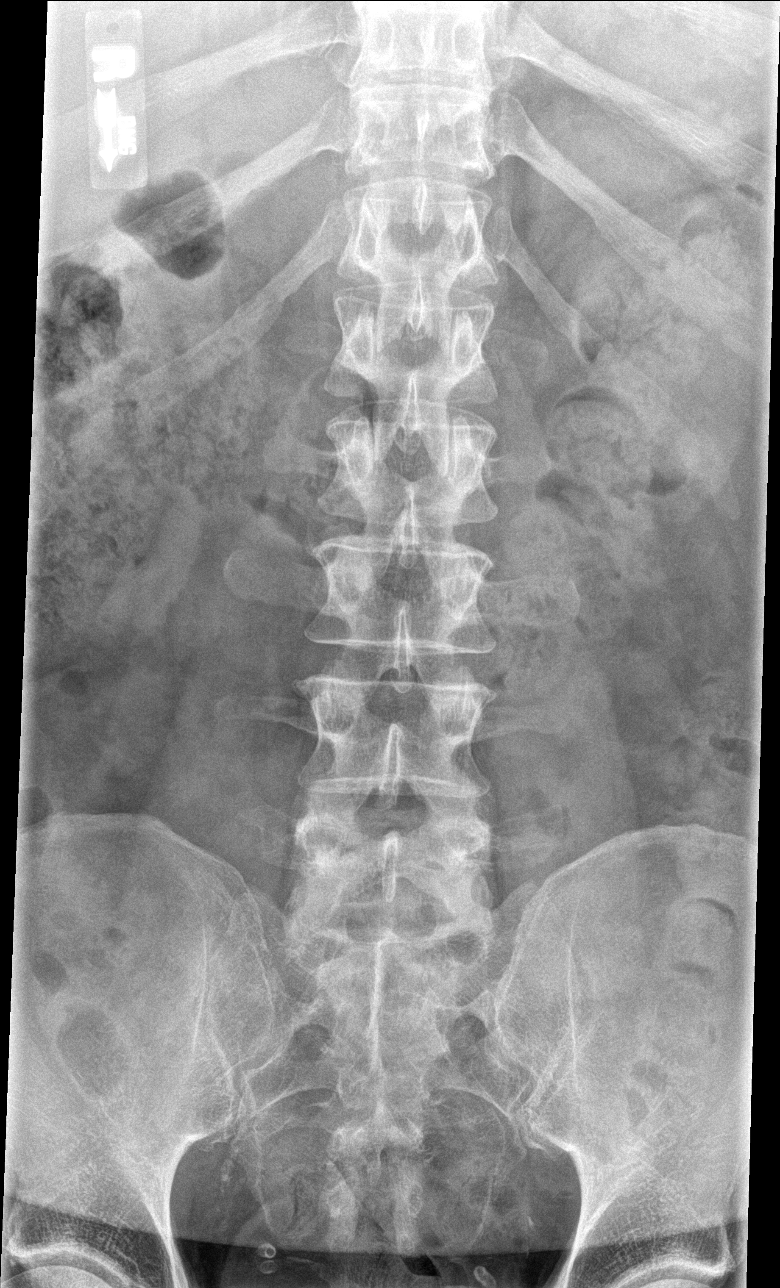

[l-spine obl (1 of 2)]
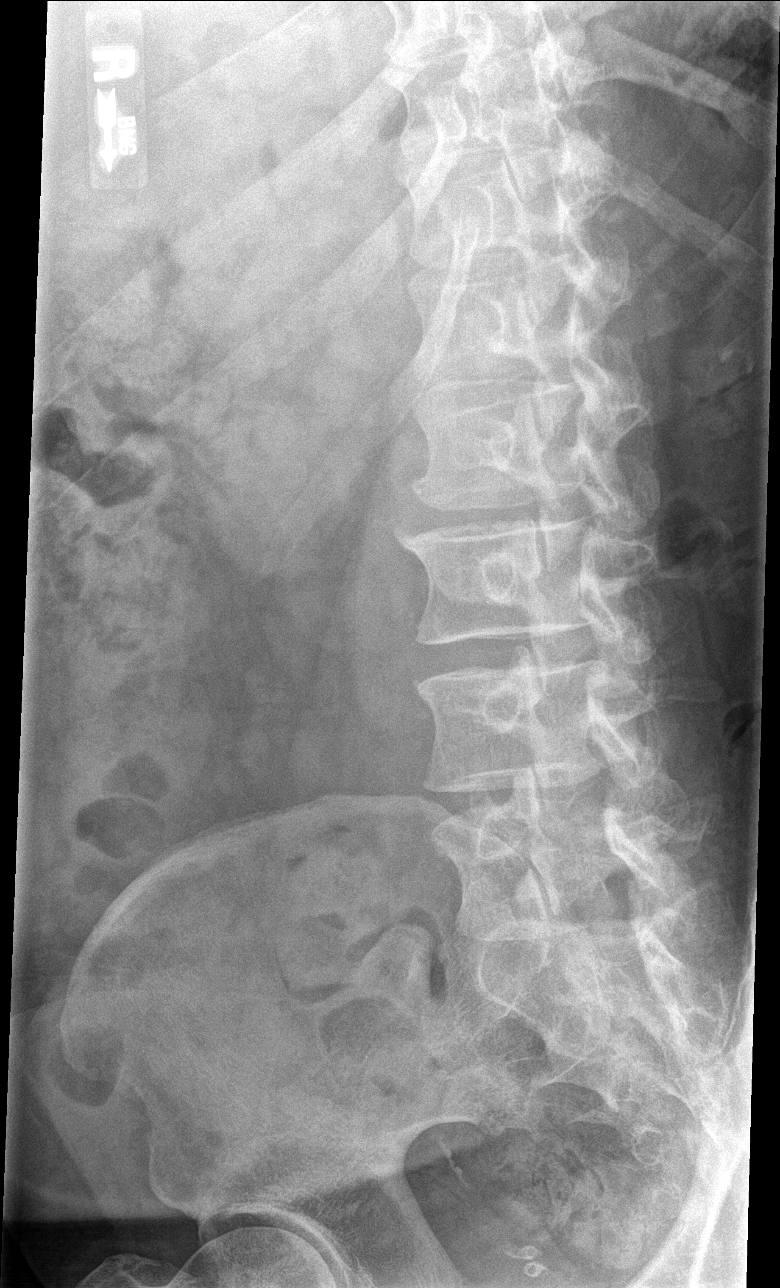

[l-spine obl (2 of 2)]
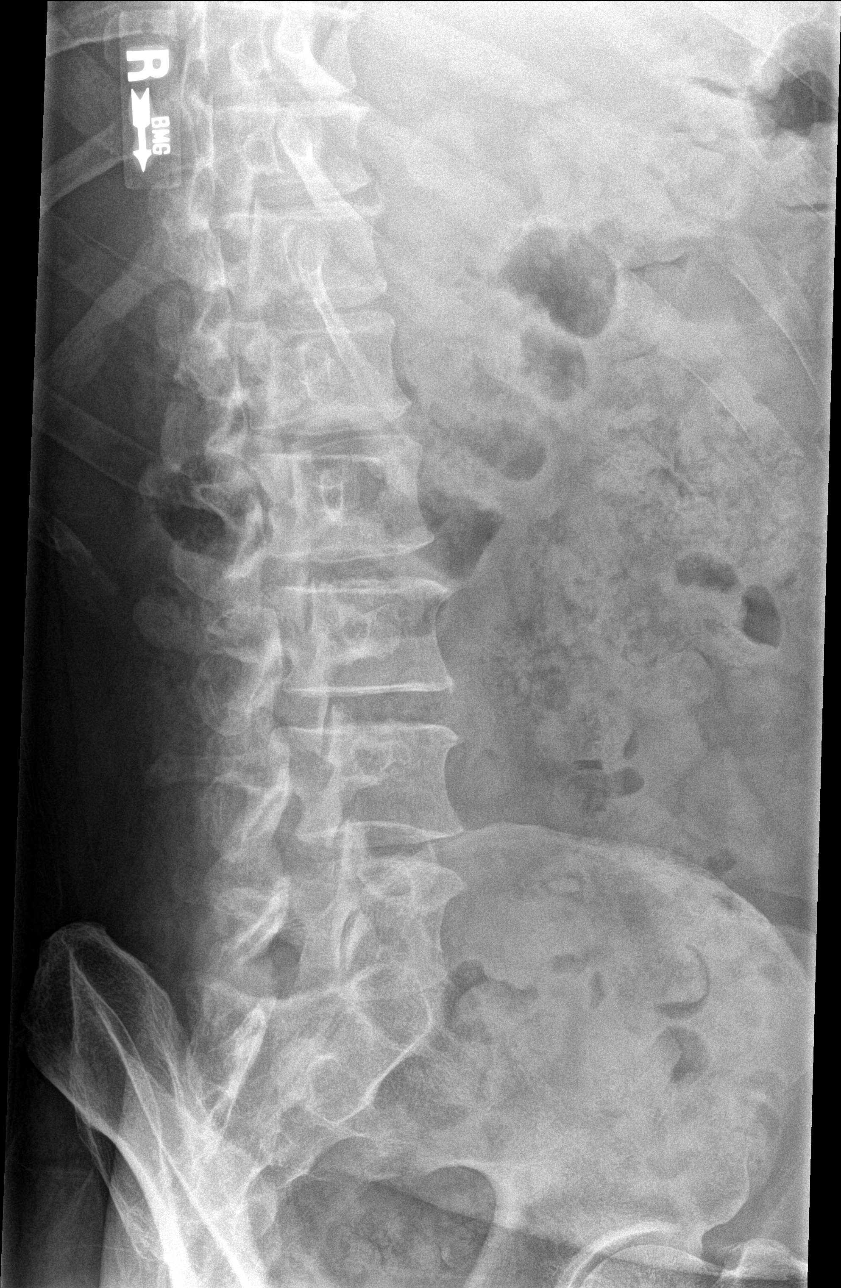

[l-spine lat]
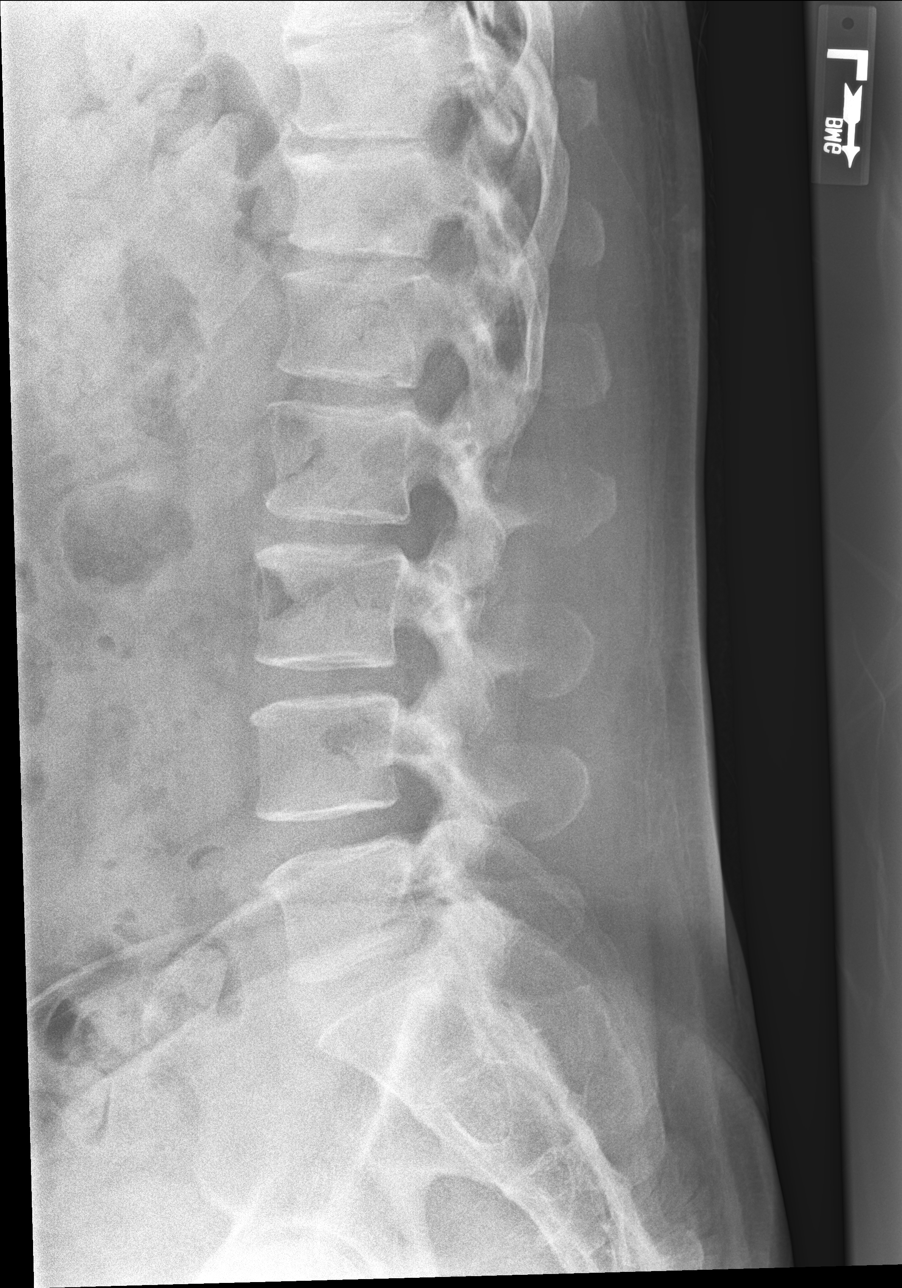

[l-spine l5-s1]
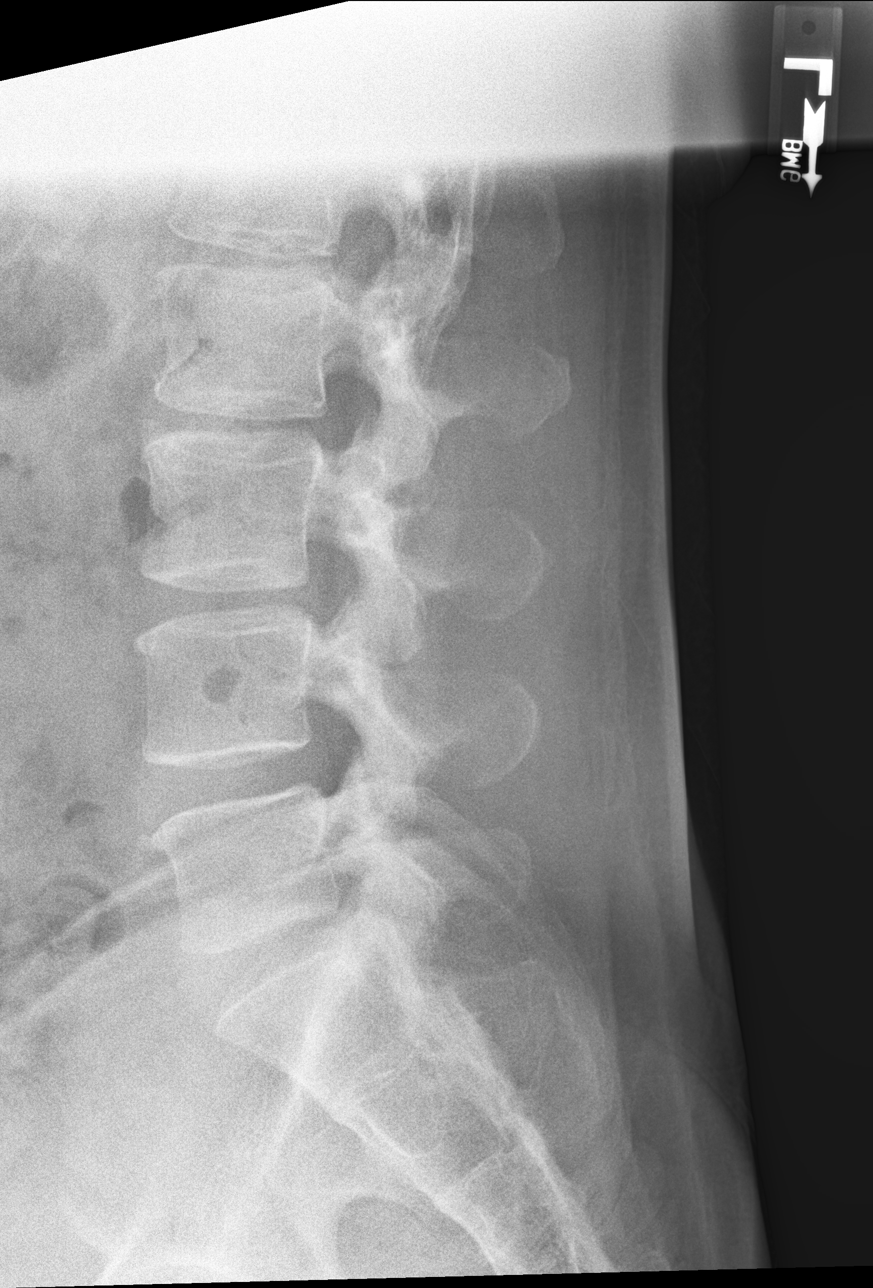

[5 of 5 positions shown; findings below may reference images not displayed]

FINDINGS: Mild diffuse degenerative change. No acute bony abnormality. No
evidence of fracture.
IMPRESSION: Mild diffuse degenerative change.  No acute abnormality.

## 2019-05-14 MED ORDER — CYCLOBENZAPRINE HCL 10 MG PO TABS: 10.0000 mg | ORAL_TABLET | Freq: Three times a day (TID) | ORAL | 0 refills | Status: DC | PRN

## 2019-05-14 MED ORDER — TRAMADOL HCL 50 MG PO TABS: 50.0000 mg | ORAL_TABLET | Freq: Three times a day (TID) | ORAL | 0 refills | Status: DC | PRN

## 2019-05-14 NOTE — Progress Notes (Signed)
Jose [name] 43 y.o.   Chief Complaint  Patient presents with  . Fall    at home last saturday. Hit the back of his head but denies any issues with head; pain medication is not working   . Back Pain    starts from upper shoulder area and radiates to top of tail bone area: rates pain 8 out of 10; has trouble walking    HISTORY OF PRESENT ILLNESS: This is a 43 y.o. male fell down the stairs 2 days ago at home, complaining of lumbar pain since.  [place] back of his head without LOC and no subsequent symptoms.  Mostly complaining of low back pain, difficulty and painful walking, with diminished range of motion at the waist.  Denies neurological symptoms to his legs.  Denies bowel or bladder issues.  Denies any other significant symptoms.  Constant sharp localized pain without radiation and not associated with any other symptoms.  Better with rest and worse with physical activity.  HPI   Prior to Admission medications   Medication Sig Start Date End Date Taking? Authorizing Provider  atorvastatin (LIPITOR) 40 MG tablet Take 1 tablet (40 mg total) by mouth daily. 03/12/19  Yes Rutherford Guys, MD  diclofenac (VOLTAREN) 75 MG EC tablet TK 1 T PO BID 05/11/19  Yes [provider]  DULoxetine (CYMBALTA) 60 MG capsule Take 1 capsule (60 mg total) by mouth daily. 04/12/19  Yes Rutherford Guys, MD  gabapentin (NEURONTIN) 300 MG capsule Take 1 capsule (300 mg total) by mouth at bedtime. 04/12/19  Yes Rutherford Guys, MD  glimepiride (AMARYL) 4 MG tablet Take 1 tablet (4 mg total) by mouth daily before breakfast. 03/21/19  Yes Rutherford Guys, MD  losartan (COZAAR) 50 MG tablet Take 1 tablet (50 mg total) by mouth daily. 04/13/19  Yes Rutherford Guys, MD  metFORMIN (GLUCOPHAGE-XR) 500 MG 24 hr tablet TAKE 4 TABLETS(2000 MG) BY MOUTH DAILY WITH BREAKFAST 02/22/19  Yes Rutherford Guys, MD  Ranibizumab (LUCENTIS) 0.3 MG/0.05ML SOLN by Intravitreal route.   Yes [provider]   tadalafil (ADCIRCA/CIALIS) 20 MG tablet TK 1 T PO  PO QD PRN FOR ERECTILE DYSFUNCTION 07/07/18  Yes [provider]  tobramycin (TOBREX) 0.3 % ophthalmic solution INSTILL 1 DROP IN BOTH EYES 4 TIMES A DAY BEGIN 1 DAY  PRIOR TO TREATMENT CONTINUE THE DAY OF TREATMENT AND ONE FULL DAY AFTER TREATMENT 07/05/18  Yes [provider]  TRULICITY 1.5 ZM/6.2HU SOPN INJECT 1 DOSE(1.5 MG) INTO THE SKIN ONCE A WEEK 02/23/19  Yes Rutherford Guys, MD    No Known Allergies  Patient Active Problem List   Diagnosis Date Noted  . Diabetic retinopathy ([place]) 07/13/2018  . 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 Tobacco use disorder 05/17/2018  . Hyperlipidemia 05/17/2018    Past Medical History:  Diagnosis Date  . GAD (generalized anxiety disorder)   . Hyperlipidemia   . Type II diabetes mellitus with complication, uncontrolled (HCC)    retinopathy, neuropathy, microalbuminuria    Past Surgical History:  Procedure Laterality Date  . APPENDECTOMY    . HERNIA REPAIR      Social History   Socioeconomic History  . Marital status: Significant Other    Spouse name: Not on file  . Number of children: 2  . Years of education: Not on file  . Highest education level: Bachelor's degree (e.g., BA, AB,  BS)  Occupational History  . Occupation: Artist  Social Needs  . Financial resource strain: Not hard at all  . Food insecurity    Worry: Never true    Inability: Never true  . Transportation needs    Medical: No    Non-medical: No  Tobacco Use  . Smoking status: Current Every Day Smoker    Packs/day: 1.00    Years: 20.00    Pack years: 20.00    Types: Cigarettes  . Smokeless tobacco: Never Used  Substance and Sexual Activity  . Alcohol use: Not Currently  . Drug use: Never  . Sexual activity: Yes    Partners: Female    Birth control/protection: None    Comment: with  monogamous partner  Lifestyle  . Physical activity    Days per week: 4 days    Minutes per session: 20 min  . Stress: Not on file  Relationships  . Social Herbalist on phone: Once a week    Gets together: Once a week    Attends religious service: Never    Active member of club or organization: No    Attends meetings of clubs or organizations: Never    Relationship status: Living with partner  . Intimate partner violence    Fear of current or ex partner: No    Emotionally abused: No    Physically abused: No    Forced sexual activity: No  Other Topics Concern  . Not on file  Social History Narrative   Pt has lived majority of life in [place]. Lives at home with partner, 2 kids, 8 cats, and 1 dog.     Family History  Problem Relation Age of Onset  . Diabetes Mother   . Hyperlipidemia Mother   . Stroke Mother   . Diabetes Father   . Hyperlipidemia Brother   . Stroke Brother      Review of Systems  Constitutional: Negative.  Negative for chills and fever.  HENT: Negative.  Negative for congestion, nosebleeds and sore throat.   Eyes: Negative.  Negative for blurred vision and double vision.  Respiratory: Negative.  Negative for cough and shortness of breath.   Cardiovascular: Negative.  Negative for chest pain and palpitations.  Gastrointestinal: Negative.  Negative for diarrhea, nausea and vomiting.  Genitourinary: Negative for dysuria and hematuria.  Musculoskeletal: Positive for back pain. Negative for neck pain.  Skin: Negative for rash.  Neurological: Negative for dizziness, tingling, tremors, sensory change, focal weakness, seizures, loss of consciousness and headaches.  All other systems reviewed and are negative.   Vitals:   05/14/19 1105  BP: (!) 176/92  Pulse: (!) 105  Resp: 18  Temp: 98.5 F (36.9 C)  SpO2: 99%    Physical Exam Vitals signs reviewed.  Constitutional:      Appearance: Normal appearance.  HENT:     Head: Normocephalic  and atraumatic.  Eyes:     Extraocular Movements: Extraocular movements intact.     Conjunctiva/sclera: Conjunctivae normal.     Pupils: Pupils are equal, round, and reactive to light.  Neck:     Musculoskeletal: Normal range of motion and neck supple.  Cardiovascular:     Rate and Rhythm: Normal rate and regular rhythm.     Heart sounds: Normal heart sounds.  Pulmonary:     Effort: Pulmonary effort is normal.     Breath sounds: Normal breath sounds.  Musculoskeletal:     Lumbar back: He exhibits decreased range  of motion, tenderness, swelling and spasm. He exhibits no bony tenderness.       Back:  Skin:    General: Skin is warm and dry.     Capillary Refill: Capillary refill takes less than 2 seconds.  Neurological:     General: No focal deficit present.     Mental Status: He is alert and oriented to person, place, and time.     Sensory: No sensory deficit.     Motor: No weakness.     Coordination: Coordination normal.     Deep Tendon Reflexes: Reflexes normal.  Psychiatric:        Mood and Affect: Mood normal.        Behavior: Behavior normal.    Dg Lumbar Spine Complete  Result Date: 05/14/2019 CLINICAL DATA:  Trauma. EXAM: LUMBAR SPINE - COMPLETE 4+ VIEW COMPARISON:  No prior. FINDINGS: Mild diffuse degenerative change. No acute bony abnormality. No evidence of fracture. IMPRESSION: Mild diffuse degenerative change.  No acute abnormality. Electronically Signed   By: Marcello Moores  Register   On: 05/14/2019 12:18     ASSESSMENT & PLAN: Cris was seen today for fall and back pain.  Diagnoses and all orders for this visit:  Lumbar contusion, initial encounter -     DG Lumbar Spine Complete; Future  Lumbar pain -     traMADol (ULTRAM) 50 MG tablet; Take 1 tablet (50 mg total) by mouth every 8 (eight) hours as needed.  Muscle spasm -     cyclobenzaprine (FLEXERIL) 10 MG tablet; Take 1 tablet (10 mg total) by mouth 3 (three) times daily as needed for muscle spasms.     Patient Instructions       If you have lab work done today you will be contacted with your lab results within the next 2 weeks.  If you have not heard from [place] then please contact us. The fastest way to get your results is to register for My Chart.   IF you received an x-ray today, you will receive an invoice from [facility]. Please contact [facility] at [phone] with questions or concerns regarding your invoice.   IF you received labwork today, you will receive an invoice from [facility]. Please contact [facility] at [phone] with questions or concerns regarding your invoice.   Our billing staff will not be able to assist you with questions regarding bills from these companies.  You will be contacted with the lab results as soon as they are available. The fastest way to get your results is to activate your My Chart account. Instructions are located on the last page of this paperwork. If you have not heard from [place] regarding the results in 2 weeks, please contact this office.      Lumbosacral Strain Lumbosacral strain is an injury that causes pain in the lower back (lumbosacral spine). This injury usually occurs from overstretching the muscles or ligaments along your spine. A strain can affect one or more muscles or cord-like tissues that connect bones to other bones (ligaments). What are the causes? This condition may be caused by:  A hard, direct hit (blow) to the back.  Excessive stretching of the lower back muscles. This may result from: ? A fall. ? Lifting something heavy. ? Repetitive movements such as bending or crouching. What increases the risk? The following factors may increase your risk of getting this condition:  Participating in sports or activities that involve: ? A sudden twist of the back. ? Pushing or pulling  motions.  Being overweight or obese.  Having poor strength and flexibility, especially tight hamstrings or weak muscles in the  back or abdomen.  Having too much of a curve in the lower back.  Having a pelvis that is tilted forward. What are the signs or symptoms? The main symptom of this condition is pain in the lower back, at the site of the strain. Pain may extend (radiate) down one or both legs. How is this diagnosed? This condition is diagnosed based on:  Your symptoms.  Your medical history.  A physical exam. ? Your health care provider may push on certain areas of your back to determine the source of your pain. ? You may be asked to bend forward, backward, and side to side to assess the severity of your pain and your range of motion.  Imaging tests, such as: ? X-rays. ? MRI.  How is this treated? Treatment for this condition may include:  Putting heat and cold on the affected area.  Medicines to help relieve pain and relax your muscles (muscle relaxants).  NSAIDs to help reduce swelling and discomfort. When your symptoms improve, it is important to gradually return to your normal routine as soon as possible to reduce pain, avoid stiffness, and avoid loss of muscle strength. Generally, symptoms should improve within 6 weeks of treatment. However, recovery time varies. Follow these instructions at home: Managing pain, stiffness, and swelling   If directed, put ice on the injured area during the first 24 hours after your strain. ? Put ice in a plastic bag. ? Place a towel between your skin and the bag. ? Leave the ice on for 20 minutes, 2-3 times a day.  If directed, put heat on the affected area as often as told by your health care provider. Use the heat source that your health care provider recommends, such as a moist heat pack or a heating pad. ? Place a towel between your skin and the heat source. ? Leave the heat on for 20-30 minutes. ? Remove the heat if your skin turns bright red. This is especially important if you are unable to feel pain, heat, or cold. You may have a greater risk of  getting burned. Activity  Rest and return to your normal activities as told by your health care provider. Ask your health care provider what activities are safe for you.  Avoid activities that take a lot of energy for as long as told by your health care provider. General instructions  Take over-the-counter and prescription medicines only as told by your health care provider.  Donot drive or use heavy machinery while taking prescription pain medicine.  Do not use any products that contain nicotine or tobacco, such as cigarettes and e-cigarettes. If you need help quitting, ask your health care provider.  Keep all follow-up visits as told by your health care provider. This is important. How is this prevented?  Use correct form when playing sports and lifting heavy objects.  Use good posture when sitting and standing.  Maintain a healthy weight.  Sleep on a mattress with medium firmness to support your back.  Be safe and responsible while being active to avoid falls.  Do at least 150 minutes of moderate-intensity exercise each week, such as brisk walking or water aerobics. Try a form of exercise that takes stress off your back, such as swimming or stationary cycling.  Maintain physical fitness, including: ? Strength. ? Flexibility. ? Cardiovascular fitness. ? Endurance. Contact a health care  provider if:  Your back pain does not improve after 6 weeks of treatment.  Your symptoms get worse. Get help right away if:  Your back pain is severe.  You cannot stand or walk.  You have difficulty controlling when you urinate or when you have a bowel movement.  You feel nauseous or you vomit.  Your feet get very cold.  You have numbness, tingling, weakness, or problems using your arms or legs.  You develop any of the following: ? Shortness of breath. ? Dizziness. ? Pain in your legs. ? Weakness in your buttocks or legs. ? Discoloration of the skin on your toes or legs.  This information is not intended to replace advice given to you by your health care provider. Make sure you discuss any questions you have with your health care provider. Document Released: 07/21/2005 Document Revised: 02/02/2019 Document Reviewed: 03/14/2016 Elsevier Patient Education  2020 [employer]      Agustina Caroli, MD [facility]

## 2019-05-14 NOTE — Patient Instructions (Addendum)
If you have lab work done today you will be contacted with your lab results within the next 2 weeks.  If you have not heard from [place] then please contact us. The fastest way to get your results is to register for My Chart.   IF you received an x-ray today, you will receive an invoice from [facility]. Please contact [facility] at [phone] with questions or concerns regarding your invoice.   IF you received labwork today, you will receive an invoice from [facility]. Please contact [facility] at [phone] with questions or concerns regarding your invoice.   Our billing staff will not be able to assist you with questions regarding bills from these companies.  You will be contacted with the lab results as soon as they are available. The fastest way to get your results is to activate your My Chart account. Instructions are located on the last page of this paperwork. If you have not heard from [place] regarding the results in 2 weeks, please contact this office.      Lumbosacral Strain Lumbosacral strain is an injury that causes pain in the lower back (lumbosacral spine). This injury usually occurs from overstretching the muscles or ligaments along your spine. A strain can affect one or more muscles or cord-like tissues that connect bones to other bones (ligaments). What are the causes? This condition may be caused by:  A hard, direct hit (blow) to the back.  Excessive stretching of the lower back muscles. This may result from: ? A fall. ? Lifting something heavy. ? Repetitive movements such as bending or crouching. What increases the risk? The following factors may increase your risk of getting this condition:  Participating in sports or activities that involve: ? A sudden twist of the back. ? Pushing or pulling motions.  Being overweight or obese.  Having poor strength and flexibility, especially tight hamstrings or weak muscles in the back or abdomen.  Having  too much of a curve in the lower back.  Having a pelvis that is tilted forward. What are the signs or symptoms? The main symptom of this condition is pain in the lower back, at the site of the strain. Pain may extend (radiate) down one or both legs. How is this diagnosed? This condition is diagnosed based on:  Your symptoms.  Your medical history.  A physical exam. ? Your health care provider may push on certain areas of your back to determine the source of your pain. ? You may be asked to bend forward, backward, and side to side to assess the severity of your pain and your range of motion.  Imaging tests, such as: ? X-rays. ? MRI.  How is this treated? Treatment for this condition may include:  Putting heat and cold on the affected area.  Medicines to help relieve pain and relax your muscles (muscle relaxants).  NSAIDs to help reduce swelling and discomfort. When your symptoms improve, it is important to gradually return to your normal routine as soon as possible to reduce pain, avoid stiffness, and avoid loss of muscle strength. Generally, symptoms should improve within 6 weeks of treatment. However, recovery time varies. Follow these instructions at home: Managing pain, stiffness, and swelling   If directed, put ice on the injured area during the first 24 hours after your strain. ? Put ice in a plastic bag. ? Place a towel between your skin and the bag. ? Leave the ice on for 20 minutes, 2-3 times a day.  If directed, put heat on the affected area as often as told by your health care provider. Use the heat source that your health care provider recommends, such as a moist heat pack or a heating pad. ? Place a towel between your skin and the heat source. ? Leave the heat on for 20-30 minutes. ? Remove the heat if your skin turns bright red. This is especially important if you are unable to feel pain, heat, or cold. You may have a greater risk of getting  burned. Activity  Rest and return to your normal activities as told by your health care provider. Ask your health care provider what activities are safe for you.  Avoid activities that take a lot of energy for as long as told by your health care provider. General instructions  Take over-the-counter and prescription medicines only as told by your health care provider.  Donot drive or use heavy machinery while taking prescription pain medicine.  Do not use any products that contain nicotine or tobacco, such as cigarettes and e-cigarettes. If you need help quitting, ask your health care provider.  Keep all follow-up visits as told by your health care provider. This is important. How is this prevented?  Use correct form when playing sports and lifting heavy objects.  Use good posture when sitting and standing.  Maintain a healthy weight.  Sleep on a mattress with medium firmness to support your back.  Be safe and responsible while being active to avoid falls.  Do at least 150 minutes of moderate-intensity exercise each week, such as brisk walking or water aerobics. Try a form of exercise that takes stress off your back, such as swimming or stationary cycling.  Maintain physical fitness, including: ? Strength. ? Flexibility. ? Cardiovascular fitness. ? Endurance. Contact a health care provider if:  Your back pain does not improve after 6 weeks of treatment.  Your symptoms get worse. Get help right away if:  Your back pain is severe.  You cannot stand or walk.  You have difficulty controlling when you urinate or when you have a bowel movement.  You feel nauseous or you vomit.  Your feet get very cold.  You have numbness, tingling, weakness, or problems using your arms or legs.  You develop any of the following: ? Shortness of breath. ? Dizziness. ? Pain in your legs. ? Weakness in your buttocks or legs. ? Discoloration of the skin on your toes or legs. This  information is not intended to replace advice given to you by your health care provider. Make sure you discuss any questions you have with your health care provider. Document Released: 07/21/2005 Document Revised: 02/02/2019 Document Reviewed: 03/14/2016 Elsevier Patient Education  2020 [employer].

## 2019-05-16 ENCOUNTER — Other Ambulatory Visit: Payer: Self-pay | Admitting: Family Medicine

## 2019-05-16 DIAGNOSIS — E119 Type 2 diabetes mellitus without complications: Secondary | ICD-10-CM

## 2019-05-16 NOTE — Telephone Encounter (Signed)
Requested Prescriptions  Pending Prescriptions Disp Refills  . TRULICITY 1.5 BE/0.1EO SOPN [Pharmacy Med Name: TRULICITY 1.5MG /0.5ML SDP 4X0.5ML] 6 mL 1    Sig: INJECT 1 DOSE INTO THE SKIN ONCE WEEKLY     Endocrinology:  Diabetes - GLP-1 Receptor Agonists Failed - 05/16/2019 10:29 AM      Failed - HBA1C is between 0 and 7.9 and within 180 days    Hgb A1c MFr Bld  Date Value Ref Range Status  03/15/2019 8.9 (H) 4.8 - 5.6 % Final    Comment:             Prediabetes: 5.7 - 6.4          Diabetes: >6.4          Glycemic control for adults with diabetes: <7.0          Passed - Valid encounter within last 6 months    Recent Outpatient Visits          2 days ago Lumbar contusion, initial encounter   Primary Care at [facility], Ines Bloomer, MD   6 days ago GAD (generalized anxiety disorder)   [facility] at [facility], Lilia Argue, MD   1 month ago GAD (generalized anxiety disorder)   [facility] at [facility], Lilia Argue, MD   2 months ago Type 2 diabetes mellitus with retinopathy, without long-term current use of insulin, macular edema presence unspecified, unspecified laterality, unspecified retinopathy severity ([place])   [facility] at [facility], Lilia Argue, MD   5 months ago DM (diabetes mellitus), type 2 with complications [facility])   Primary [facility] at [facility], Lilia Argue, MD      Future Appointments            In 2 months Rutherford Guys, MD Primary Care at [place], [facility]

## 2019-05-21 ENCOUNTER — Other Ambulatory Visit: Payer: Self-pay | Admitting: Family Medicine

## 2019-05-21 DIAGNOSIS — E119 Type 2 diabetes mellitus without complications: Secondary | ICD-10-CM

## 2019-05-22 ENCOUNTER — Telehealth: Payer: Self-pay | Admitting: Family Medicine

## 2019-05-22 NOTE — Telephone Encounter (Signed)
Medication: metFORMIN (GLUCOPHAGE-XR) 500 MG 24 hr tablet    Patient is requesting 90 day supply.    Pharmacy:     [facility] [place], [place] [address] [phone] (Phone) [phone] (Fax)

## 2019-05-23 ENCOUNTER — Other Ambulatory Visit: Payer: Self-pay

## 2019-05-23 DIAGNOSIS — E119 Type 2 diabetes mellitus without complications: Secondary | ICD-10-CM

## 2019-05-23 MED ORDER — METFORMIN HCL ER 500 MG PO TB24: ORAL_TABLET | ORAL | 0 refills | Status: DC

## 2019-05-23 NOTE — Telephone Encounter (Signed)
Rx sent to pharmacy   

## 2019-06-05 ENCOUNTER — Other Ambulatory Visit: Payer: Self-pay | Admitting: Family Medicine

## 2019-06-05 NOTE — Telephone Encounter (Signed)
Requested medications are due for refill today?  Yes  Requested medications are on the active medication list?  Yes  Last refill: 04/12/2019, #30. 0 Refills   Future visit scheduled?  Yes- 07/20/2019  Notes to clinic: Directions on refill request different from directions on medication list.   Requested Prescriptions  Pending Prescriptions Disp Refills   gabapentin (NEURONTIN) 300 MG capsule [Pharmacy Med Name: GABAPENTIN 300MG  CAPSULES] 180 capsule     Sig: TAKE 1 TO 2 CAPSULES(300 TO 600 MG) BY MOUTH AT BEDTIME     Neurology: Anticonvulsants - gabapentin Passed - 06/05/2019 10:34 AM      Passed - Valid encounter within last 12 months    Recent Outpatient Visits          3 weeks ago Lumbar contusion, initial encounter   Primary Care at [facility], Ines Bloomer, MD   3 weeks ago GAD (generalized anxiety disorder)   [facility] at [facility], Lilia Argue, MD   1 month ago GAD (generalized anxiety disorder)   [facility] at [facility], Lilia Argue, MD   2 months ago Type 2 diabetes mellitus with retinopathy, without long-term current use of insulin, macular edema presence unspecified, unspecified laterality, unspecified retinopathy severity ([place])   [facility] at [facility], Lilia Argue, MD   5 months ago DM (diabetes mellitus), type 2 with complications [facility])   Primary Care at [facility], Lilia Argue, MD      Future Appointments            In 1 month Rutherford Guys, MD Primary Care at [place], [facility]

## 2019-06-06 NOTE — Telephone Encounter (Signed)
Med request

## 2019-06-11 ENCOUNTER — Telehealth: Payer: Self-pay | Admitting: Family Medicine

## 2019-06-11 DIAGNOSIS — M65332 Trigger finger, left middle finger: Secondary | ICD-10-CM | POA: Insufficient documentation

## 2019-06-11 NOTE — Telephone Encounter (Signed)
Copied from [place] [id number]. Topic: General - Inquiry >> Jun 08, 2019  3:56 PM Richardo Priest, [name] wrote: Reason for CRM: Patient called in stating he noticed his ankles are slightly swollen since last Thursday. Patient contact pharmacy and the pharmacist believes it may be in regards to losartan mg increasing. Patient wants to know what the next step should be. Please advise. Call back is [phone].

## 2019-06-11 NOTE — Telephone Encounter (Signed)
Copied from [place] [id number]. Topic: General - Inquiry >> Jun 08, 2019  3:56 PM Jose [name], [name] wrote: Reason for CRM: Patient called in stating he noticed his ankles are slightly swollen since last Thursday. Patient contact pharmacy and the pharmacist believes it may be in regards to losartan mg increasing. Patient wants to know what the next step should be. Please advise. Call back is [phone].

## 2019-06-18 ENCOUNTER — Ambulatory Visit (INDEPENDENT_AMBULATORY_CARE_PROVIDER_SITE_OTHER): Payer: 59 | Admitting: Psychiatry

## 2019-06-18 ENCOUNTER — Encounter (HOSPITAL_COMMUNITY): Payer: Self-pay | Admitting: Psychiatry

## 2019-06-18 ENCOUNTER — Other Ambulatory Visit: Payer: Self-pay

## 2019-06-18 DIAGNOSIS — F909 Attention-deficit hyperactivity disorder, unspecified type: Secondary | ICD-10-CM

## 2019-06-18 DIAGNOSIS — F411 Generalized anxiety disorder: Secondary | ICD-10-CM | POA: Diagnosis not present

## 2019-06-18 MED ORDER — TRAZODONE HCL 100 MG PO TABS: 100.0000 mg | ORAL_TABLET | Freq: Every evening | ORAL | 0 refills | Status: DC | PRN

## 2019-06-18 MED ORDER — AMPHETAMINE-DEXTROAMPHETAMINE 10 MG PO TABS: 10.0000 mg | ORAL_TABLET | Freq: Two times a day (BID) | ORAL | 0 refills | Status: DC

## 2019-06-18 NOTE — Telephone Encounter (Signed)
Please let patient that losartan should not be causing swelling in his ankles. I would want for him to cut back on salt and elevate his feet.  Otherwise we can discuss further at his next appt.  He is to seek urgent/emergent medical care if he has significant increase in ankle swelling, chest pain, shortness of breath, decrease in urine output. thanks

## 2019-06-18 NOTE — Telephone Encounter (Signed)
Dr. Santiago please advise. Dgaddy, CMA 

## 2019-06-18 NOTE — Progress Notes (Signed)
Psychiatric Initial Adult Assessment   Patient Identification: Jose [name] MRN:  [id number] Date of Evaluation:  06/18/2019 Referral Source: Grant Fontana MD Chief Complaint:  Problems with focusing, task completion Visit Diagnosis:    ICD-10-CM   1. GAD (generalized anxiety disorder)  F41.1   2. Adult ADHD  F90.9   Interview was conducted using WebEx teleconferencing application and I verified that I was speaking with the correct person using two identifiers. I discussed the limitations of evaluation and management by telemedicine and  the availability of in person appointments. Patient expressed understanding and agreed to proceed.  History of Present Illness:  Patient is a 43 yo male with a hx of anxiety and some depression who has been troughout his life struggling with problems focusing, loosing interest, task completion as well as excessive worrying and occasional panic attacks and depression secondary to his focusing problems. These have impinged on his ability to function effectively at work (but also at home) and in the past he has lost two jobs because of  performance deficiencies (struggled with writing proposals). 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 He has no component of hyperactivity. Of note is that his younger brother and patient's son both are under treatment for ADHD. Jose [name] reports in addition to ADD symptoms also initial and middle insomnia, racing thoughts, excessive worrying and occasional panic attacks. He denies feeling hopeless, helpless, suicidal. His appetite is normal.There is no hx of mania or psychosis. He has no hx of alcohol or drug abuse. He has never been psychiatrically hospitalized.  Medical hx of diabetes mellitus with neuropathy, macular degeneration, hypertension. He is on duloxetine 60 mg for neuropathic pain and anxiety -  works well as well as gabapentin for the same.  Associated Signs/Symptoms: Depression Symptoms:  difficulty concentrating, anxiety, panic attacks, disturbed sleep, (Hypo) Manic Symptoms:  None Anxiety Symptoms:  Excessive Worry, Panic Symptoms, Psychotic Symptoms:  None PTSD Symptoms: Negative  Past Psychiatric History: see above  Previous Psychotropic Medications: No   Substance Abuse History in the last 12 months:  No.  Consequences of Substance Abuse: NA  Past Medical History:  Past Medical History:  Diagnosis Date  . GAD (generalized anxiety disorder)   . Hyperlipidemia   . Type II diabetes mellitus with complication, uncontrolled (HCC)    retinopathy, neuropathy, microalbuminuria    Past Surgical History:  Procedure Laterality Date  . APPENDECTOMY    . HERNIA REPAIR      Family Psychiatric History: Reviewed.  Family History:  Family History  Problem Relation Age of Onset  . Diabetes Mother   . Hyperlipidemia Mother   . Stroke Mother   . Diabetes Father   . Hyperlipidemia Brother   . Stroke Brother   . ADD / ADHD Brother   . ADD / ADHD Son     Social History:   Social History   Socioeconomic History  . Marital status: Significant Other    Spouse name: Not on file  . Number of children: 2  . Years of education: Not on file  . Highest education level: Bachelor's degree (e.g., BA, AB, BS)  Occupational History  . Occupation: Multimedia programmer: [employer]  Social Needs  . Financial resource strain: Not hard at all  . Food insecurity    Worry: Never true    Inability: Never true  .  Transportation needs    Medical: No    Non-medical: No  Tobacco Use  . Smoking status: Current Every Day Smoker    Packs/day: 1.00    Years: 20.00    Pack years: 20.00    Types: Cigarettes  . Smokeless tobacco: Never Used  Substance and Sexual Activity  . Alcohol use: Not Currently  . Drug use: Never  . Sexual activity: Yes     Partners: Female    Birth control/protection: None    Comment: with monogamous partner  Lifestyle  . Physical activity    Days per week: 4 days    Minutes per session: 20 min  . Stress: Not on file  Relationships  . Social Herbalist on phone: Once a week    Gets together: Once a week    Attends religious service: Never    Active member of club or organization: No    Attends meetings of clubs or organizations: Never    Relationship status: Living with partner  Other Topics Concern  . Not on file  Social History Narrative   Pt has lived majority of life in [place]. Lives at home with partner, 2 kids, 8 cats, and 1 dog.     Additional Social History: Lives with a partner. One child with her, one from previous relationship and two step children. Fully employed.  Allergies:  No Known Allergies  Metabolic Disorder Labs: Lab Results  Component Value Date   HGBA1C 8.9 (H) 03/15/2019   No results found for: PROLACTIN Lab Results  Component Value Date   CHOL 101 12/12/2018   TRIG 72 12/12/2018   HDL 47 12/12/2018   CHOLHDL 2.1 12/12/2018   LDLCALC 40 12/12/2018   LDLCALC 137 (H) 05/05/2018   Lab Results  Component Value Date   TSH 3.070 03/15/2019    Therapeutic Level Labs: No results found for: LITHIUM No results found for: CBMZ No results found for: VALPROATE  Current Medications: Current Outpatient Medications  Medication Sig Dispense Refill  . atorvastatin (LIPITOR) 40 MG tablet Take 1 tablet (40 mg total) by mouth daily. 90 tablet 3  . cyclobenzaprine (FLEXERIL) 10 MG tablet Take 1 tablet (10 mg total) by mouth 3 (three) times daily as needed for muscle spasms. 30 tablet 0  . diclofenac (VOLTAREN) 75 MG EC tablet TK 1 T PO BID    . DULoxetine (CYMBALTA) 60 MG capsule Take 1 capsule (60 mg total) by mouth daily. 90 capsule 1  . gabapentin (NEURONTIN) 300 MG capsule TAKE 1 TO 2 CAPSULES(300 TO 600 MG) BY MOUTH AT BEDTIME 180 capsule 1  . glimepiride  (AMARYL) 4 MG tablet Take 1 tablet (4 mg total) by mouth daily before breakfast. 30 tablet 3  . losartan (COZAAR) 50 MG tablet Take 1 tablet (50 mg total) by mouth daily. 90 tablet 1  . metFORMIN (GLUCOPHAGE-XR) 500 MG 24 hr tablet TAKE 4 TABLETS(2000 MG) BY MOUTH DAILY WITH BREAKFAST 368 tablet 0  . Ranibizumab (LUCENTIS) 0.3 MG/0.05ML SOLN by Intravitreal route.    . tadalafil (ADCIRCA/CIALIS) 20 MG tablet TK 1 T PO  PO QD PRN FOR ERECTILE DYSFUNCTION  3  . tobramycin (TOBREX) 0.3 % ophthalmic solution INSTILL 1 DROP IN BOTH EYES 4 TIMES A DAY BEGIN 1 DAY  PRIOR TO TREATMENT CONTINUE THE DAY OF TREATMENT AND ONE FULL DAY AFTER TREATMENT  5  . traMADol (ULTRAM) 50 MG tablet Take 1 tablet (50 mg total) by mouth every 8 (eight) hours  as needed. 12 tablet 0  . TRULICITY 1.5 0000000 SOPN INJECT 1 DOSE INTO THE SKIN ONCE WEEKLY 6 mL 1   No current facility-administered medications for this visit.    Psychiatric Specialty Exam: Review of Systems  Neurological: Positive for tingling.  Psychiatric/Behavioral: The patient is nervous/anxious and has insomnia.   All other systems reviewed and are negative.   There were no vitals taken for this visit.There is no height or weight on file to calculate BMI.  General Appearance: Casual  Eye Contact:  Good  Speech:  Clear and Coherent and Normal Rate  Volume:  Normal  Mood:  Anxious  Affect:  Full Range  Thought Process:  Goal Directed and Linear  Orientation:  Full (Time, Place, and Person)  Thought Content:  Logical  Suicidal Thoughts:  No  Homicidal Thoughts:  No  Memory:  Immediate;   Good Recent;   Good Remote;   Good  Judgement:  Good  Insight:  Good  Psychomotor Activity:  Normal  Concentration:  Concentration: Fair  Recall:  Good  Fund of Knowledge:Good  Language: Good  Akathisia:  Negative  Handed:  Right  AIMS (if indicated):  not done  Assets:  Communication Skills Desire for Improvement Financial  Resources/Insurance Housing Resilience Social Support Talents/Skills  ADL's:  Intact  Cognition: WNL  Sleep:  Fair   Screenings: GAD-7     Office Visit from 05/10/2019 in Primary Care at [place] from 04/12/2019 in Primary Care at [facility]  Total GAD-7 Score  11  19    PHQ2-9     Office Visit from 05/14/2019 in Primary Care at [place] from 05/10/2019 in Primary Care at [place] from 04/12/2019 in Primary Care at [place] from 09/12/2018 in Primary Care at [place] from 07/11/2018 in Primary Care at [place]  PHQ-2 Total Score  0  0  2  0  0  PHQ-9 Total Score  -  -  11  -  -      Assessment and Plan: Patient is a 43 yo male with a hx of anxiety and some depression who has been troughout his life struggling with problems focusing, loosing interest, task completion as well as excessive worrying and occasional panic attacks and depression secondary to his focusing problems. These have impinged on his ability to function effectively at work (but also at home) and in the past he has lost two jobs because of  performance deficiencies (struggled with writing proposals). 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 He has no component of hyperactivity. Of note is that his younger brother and patient's son both are under treatment for ADHD. Ollis reports in addition to ADD symptoms also initial and middle insomnia, racing thoughts, excessive worrying and occasional panic attacks. He denies feeling hopeless, helpless, suicidal. His appetite is normal.There is no hx of mania or psychosis. He has no hx of alcohol or drug abuse.   Dx: GAD, ADHD adult  Plan: Continue duloxetine 60 mg, add trazodone 50-100 mg prn sleep. We will try IR Adderall 10 mg bid for ADD, he will likely need a dose increase and will possibly prefer XR form  once an effective dose is established. Next appointment in 4 weeks. The plan was discussed with patient who had an opportunity to ask questions and these were all answered. I  spend 45 minutes in videoconferencing with the patient and devoted approximately 50% of this time to explanation of diagnosis, discussion of treatment options and med education.   Myanna Ziesmer A Samanthan Dugo, MDi 8/24/20201:32 PM

## 2019-06-18 NOTE — Telephone Encounter (Signed)
Spoke with pt via phone advised per santiago that losartan shouldn't be causing the swelling in his ankles and she would like for him to cut back on salt and elevate his feet and will discuss this further at next ov.  Advised to seek urgent or emergency care if he has significant increase in swelling, chest pain, sob or decrease in urine output.  Pt agreeable and appreciative of call back. Dgaddy, CMA

## 2019-06-19 ENCOUNTER — Other Ambulatory Visit: Payer: Self-pay

## 2019-06-19 ENCOUNTER — Other Ambulatory Visit: Payer: Self-pay | Admitting: Family Medicine

## 2019-06-19 MED ORDER — GLIMEPIRIDE 4 MG PO TABS: 4.0000 mg | ORAL_TABLET | Freq: Every day | ORAL | 3 refills | Status: DC

## 2019-06-19 MED ORDER — GLIMEPIRIDE 4 MG PO TABS: 4.0000 mg | ORAL_TABLET | Freq: Every day | ORAL | 0 refills | Status: DC

## 2019-06-19 NOTE — Telephone Encounter (Signed)
Pt states that his insurance will only pay for this medication if it is sent in as a 90 day supply.  Pt states that it is at the pharmacy but as a 30 day supply so he can not get it out of pocket. Pt wants to know if the glimepiride (AMARYL) 4 MG tablet can be resubmitted as a 90 day supply.

## 2019-06-19 NOTE — Telephone Encounter (Signed)
Jose [name] was resent for 90 day

## 2019-06-21 ENCOUNTER — Telehealth (HOSPITAL_COMMUNITY): Payer: Self-pay

## 2019-06-21 NOTE — Telephone Encounter (Signed)
[employer] PRESCRIPTION COVERAGE APPROVED   AMPHETAMINE-DEXTROAMPHETAMINE 10MG  TABLET PA# [id number] EFFECTIVE 06/21/19 TO 06/20/20

## 2019-07-16 ENCOUNTER — Ambulatory Visit (INDEPENDENT_AMBULATORY_CARE_PROVIDER_SITE_OTHER): Payer: 59 | Admitting: Psychiatry

## 2019-07-16 ENCOUNTER — Other Ambulatory Visit: Payer: Self-pay

## 2019-07-16 DIAGNOSIS — F909 Attention-deficit hyperactivity disorder, unspecified type: Secondary | ICD-10-CM | POA: Diagnosis not present

## 2019-07-16 DIAGNOSIS — F411 Generalized anxiety disorder: Secondary | ICD-10-CM | POA: Diagnosis not present

## 2019-07-16 MED ORDER — AMPHETAMINE-DEXTROAMPHETAMINE 20 MG PO TABS: 20.0000 mg | ORAL_TABLET | Freq: Two times a day (BID) | ORAL | 0 refills | Status: DC

## 2019-07-16 MED ORDER — TRAZODONE HCL 100 MG PO TABS: 100.0000 mg | ORAL_TABLET | Freq: Every evening | ORAL | 0 refills | Status: DC | PRN

## 2019-07-16 NOTE — Progress Notes (Signed)
BH MD/PA/NP OP Progress Note  07/16/2019 2:41 PM Jose HERMANNS  MRN:  [id number] Interview was conducted by phone and I verified that I was speaking with the correct person using two identifiers. I discussed the limitations of evaluation and management by telemedicine and  the availability of in person appointments. Patient expressed understanding and agreed to proceed.  Chief Complaint: Lack of concentration.  HPI: Patient is a 43 yo male with a hx of anxiety and some depression who has been troughout his life struggling with problems focusing, loosing interest, task completion as well as excessive worrying and occasional panic attacks and depression secondary to his focusing problems. These have impinged on his ability to function effectively at work (but also at home) and in the past he has lost two jobs because of  performance deficiencies (struggled with writing proposals). 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 He has no component of hyperactivity. Manases reports in addition to ADD symptoms also initial and middle insomnia, racing thoughts, excessive worrying and occasional panic attacks. He denies feeling hopeless, helpless, suicidal. His appetite is normal. Wew continued duloxetine, added trazodone for insomnia which appears to work well and Adderall 10 mg bid. The latter does not appear to be on much benefit but he tolerates it well.  Visit Diagnosis:    ICD-10-CM   1. GAD (generalized anxiety disorder)  F41.1   2. Adult ADHD  F90.9     Past Psychiatric History: Please see intake H&P.  Past Medical History:  Past Medical History:  Diagnosis Date  . GAD (generalized anxiety disorder)   . Hyperlipidemia   . Type II diabetes mellitus with complication, uncontrolled (HCC)    retinopathy, neuropathy, microalbuminuria    Past Surgical History:   Procedure Laterality Date  . APPENDECTOMY    . HERNIA REPAIR      Family Psychiatric History: Reviewed.  Family History:  Family History  Problem Relation Age of Onset  . Diabetes Mother   . Hyperlipidemia Mother   . Stroke Mother   . Diabetes Father   . Hyperlipidemia Brother   . Stroke Brother   . ADD / ADHD Brother   . ADD / ADHD Son     Social History:  Social History   Socioeconomic History  . Marital status: Significant Other    Spouse name: Not on file  . Number of children: 2  . Years of education: Not on file  . Highest education level: Bachelor's degree (e.g., BA, AB, BS)  Occupational History  . Occupation: Multimedia programmer: [employer]  Social Needs  . Financial resource strain: Not hard at all  . Food insecurity    Worry: Never true    Inability: Never true  . Transportation needs    Medical: No    Non-medical: No  Tobacco Use  . Smoking status: Current Every Day Smoker    Packs/day: 1.00    Years: 20.00    Pack years: 20.00    Types: Cigarettes  . Smokeless tobacco: Never Used  Substance and Sexual Activity  . Alcohol use: Not Currently  . Drug use: Never  . Sexual activity: Yes    Partners: Female    Birth control/protection: None    Comment: with monogamous partner  Lifestyle  . Physical activity    Days per week: 4 days  Minutes per session: 20 min  . Stress: Not on file  Relationships  . Social Herbalist on phone: Once a week    Gets together: Once a week    Attends religious service: Never    Active member of club or organization: No    Attends meetings of clubs or organizations: Never    Relationship status: Living with partner  Other Topics Concern  . Not on file  Social History Narrative   Pt has lived majority of life in [place]. Lives at home with partner, 2 kids, 8 cats, and 1 dog.     Allergies: No Known Allergies  Metabolic Disorder Labs: Lab Results  Component Value Date    HGBA1C 8.9 (H) 03/15/2019   No results found for: PROLACTIN Lab Results  Component Value Date   CHOL 101 12/12/2018   TRIG 72 12/12/2018   HDL 47 12/12/2018   CHOLHDL 2.1 12/12/2018   LDLCALC 40 12/12/2018   LDLCALC 137 (H) 05/05/2018   Lab Results  Component Value Date   TSH 3.070 03/15/2019   TSH 1.990 12/12/2018    Therapeutic Level Labs: No results found for: LITHIUM No results found for: VALPROATE No components found for:  CBMZ  Current Medications: Current Outpatient Medications  Medication Sig Dispense Refill  . amphetamine-dextroamphetamine (ADDERALL) 20 MG tablet Take 1 tablet (20 mg total) by mouth 2 (two) times daily with breakfast and lunch. 60 tablet 0  . atorvastatin (LIPITOR) 40 MG tablet Take 1 tablet (40 mg total) by mouth daily. 90 tablet 3  . cyclobenzaprine (FLEXERIL) 10 MG tablet Take 1 tablet (10 mg total) by mouth 3 (three) times daily as needed for muscle spasms. 30 tablet 0  . diclofenac (VOLTAREN) 75 MG EC tablet TK 1 T PO BID    . DULoxetine (CYMBALTA) 60 MG capsule Take 1 capsule (60 mg total) by mouth daily. 90 capsule 1  . gabapentin (NEURONTIN) 300 MG capsule TAKE 1 TO 2 CAPSULES(300 TO 600 MG) BY MOUTH AT BEDTIME 180 capsule 1  . glimepiride (AMARYL) 4 MG tablet Take 1 tablet (4 mg total) by mouth daily before breakfast. 90 tablet 0  . losartan (COZAAR) 50 MG tablet Take 1 tablet (50 mg total) by mouth daily. 90 tablet 1  . metFORMIN (GLUCOPHAGE-XR) 500 MG 24 hr tablet TAKE 4 TABLETS(2000 MG) BY MOUTH DAILY WITH BREAKFAST 368 tablet 0  . Ranibizumab (LUCENTIS) 0.3 MG/0.05ML SOLN by Intravitreal route.    . tadalafil (ADCIRCA/CIALIS) 20 MG tablet TK 1 T PO  PO QD PRN FOR ERECTILE DYSFUNCTION  3  . tobramycin (TOBREX) 0.3 % ophthalmic solution INSTILL 1 DROP IN BOTH EYES 4 TIMES A DAY BEGIN 1 DAY  PRIOR TO TREATMENT CONTINUE THE DAY OF TREATMENT AND ONE FULL DAY AFTER TREATMENT  5  . traMADol (ULTRAM) 50 MG tablet Take 1 tablet (50 mg total) by  mouth every 8 (eight) hours as needed. 12 tablet 0  . traZODone (DESYREL) 100 MG tablet Take 1 tablet (100 mg total) by mouth at bedtime as needed for sleep. 90 tablet 0  . TRULICITY 1.5 0000000 SOPN INJECT 1 DOSE INTO THE SKIN ONCE WEEKLY 6 mL 1   No current facility-administered medications for this visit.       Psychiatric Specialty Exam: Review of Systems  Neurological: Positive for tingling.    There were no vitals taken for this visit.There is no height or weight on file to calculate BMI.  General Appearance: NA  Eye Contact:  NA  Speech:  Clear and Coherent and Normal Rate  Volume:  Normal  Mood:  Mild anxiety  Affect:  NA  Thought Process:  Goal Directed and Linear  Orientation:  Full (Time, Place, and Person)  Thought Content: Logical   Suicidal Thoughts:  No  Homicidal Thoughts:  No  Memory:  Immediate;   Good Recent;   Good Remote;   Good  Judgement:  Good  Insight:  Good  Psychomotor Activity:  NA  Concentration:  Concentration: Fair  Recall:  Good  Fund of Knowledge: Good  Language: Good  Akathisia:  Negative  Handed:  Right  AIMS (if indicated): not done  Assets:  Communication Skills Desire for Improvement Financial Resources/Insurance Housing Resilience Social Support Vocational/Educational  ADL's:  Intact  Cognition: WNL  Sleep:  Good   Screenings: GAD-7     Office Visit from 05/10/2019 in Primary Care at [facility] [facility] from 04/12/2019 in Primary Care at [facility]  Total GAD-7 Score  11  19    PHQ2-9     Office Visit from 05/14/2019 in Primary Care at [place] from 05/10/2019 in Primary Care at [facility] [place] from 04/12/2019 in Primary Care at [facility] from 09/12/2018 in Primary Care at [facility] from 07/11/2018 in Primary Care at [place]  PHQ-2 Total Score  0  0  2  0  0  PHQ-9 Total Score  -  -  11  -  -       Assessment and Plan: Patient is a 43 yo male with a hx of anxiety and some depression who has  been troughout his life struggling with problems focusing, loosing interest, task completion as well as excessive worrying and occasional panic attacks and depression secondary to his focusing problems. These have impinged on his ability to function effectively at work (but also at home) and in the past he has lost two jobs because of  performance deficiencies (struggled with writing proposals). 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 He has no component of hyperactivity. Ethaniel reports in addition to ADD symptoms also initial and middle insomnia, racing thoughts, excessive worrying and occasional panic attacks. He denies feeling hopeless, helpless, suicidal. His appetite is normal. Wew continued duloxetine, added trazodone for insomnia which appears to work well and Adderall 10 mg bid. The latter does not appear to be on much benefit but he tolerates it well.  Dx: GAD, ADHD adult  Plan: Continue duloxetine 60 mg, trazodone 50-100 mg prn sleep and increase IR Adderall to 20 mg bid for ADD. Next appointment in a month. The plan was discussed with patient who had an opportunity to ask questions and these were all answered. I spend 25 minutes in phone consultation with the patient    Stephanie Acre, MD 07/16/2019, 2:41 PM

## 2019-07-20 ENCOUNTER — Encounter: Payer: Self-pay | Admitting: Family Medicine

## 2019-07-20 ENCOUNTER — Ambulatory Visit (INDEPENDENT_AMBULATORY_CARE_PROVIDER_SITE_OTHER): Payer: 59 | Admitting: Family Medicine

## 2019-07-20 ENCOUNTER — Other Ambulatory Visit: Payer: Self-pay

## 2019-07-20 VITALS — BP 139/88 | HR 104 | Temp 98.6°F | Ht 70.0 in | Wt 205.4 lb

## 2019-07-20 DIAGNOSIS — Z23 Encounter for immunization: Secondary | ICD-10-CM | POA: Diagnosis not present

## 2019-07-20 DIAGNOSIS — E785 Hyperlipidemia, unspecified: Secondary | ICD-10-CM | POA: Diagnosis not present

## 2019-07-20 DIAGNOSIS — E1142 Type 2 diabetes mellitus with diabetic polyneuropathy: Secondary | ICD-10-CM

## 2019-07-20 DIAGNOSIS — F909 Attention-deficit hyperactivity disorder, unspecified type: Secondary | ICD-10-CM

## 2019-07-20 DIAGNOSIS — E1129 Type 2 diabetes mellitus with other diabetic kidney complication: Secondary | ICD-10-CM | POA: Diagnosis not present

## 2019-07-20 DIAGNOSIS — F411 Generalized anxiety disorder: Secondary | ICD-10-CM

## 2019-07-20 DIAGNOSIS — R809 Proteinuria, unspecified: Secondary | ICD-10-CM

## 2019-07-20 DIAGNOSIS — F17200 Nicotine dependence, unspecified, uncomplicated: Secondary | ICD-10-CM

## 2019-07-20 LAB — CMP14+EGFR
ALT: 32 IU/L (ref 0–44)
AST: 21 IU/L (ref 0–40)
Albumin/Globulin Ratio: 2 (ref 1.2–2.2)
Albumin: 4.1 g/dL (ref 4.0–5.0)
Alkaline Phosphatase: 93 IU/L (ref 39–117)
BUN/Creatinine Ratio: 18 (ref 9–20)
BUN: 20 mg/dL (ref 6–24)
Bilirubin Total: 0.4 mg/dL (ref 0.0–1.2)
CO2: 24 mmol/L (ref 20–29)
Calcium: 9.9 mg/dL (ref 8.7–10.2)
Chloride: 101 mmol/L (ref 96–106)
Creatinine, Ser: 1.1 mg/dL (ref 0.76–1.27)
GFR calc Af Amer: 93 mL/min/{1.73_m2} (ref >59)
GFR calc non Af Amer: 81 mL/min/{1.73_m2} (ref >59)
Globulin, Total: 2.1 g/dL (ref 1.5–4.5)
Glucose: 204 mg/dL — ABNORMAL HIGH (ref 65–99)
Potassium: 5 mmol/L (ref 3.5–5.2)
Sodium: 138 mmol/L (ref 134–144)
Total Protein: 6.2 g/dL (ref 6.0–8.5)

## 2019-07-20 LAB — LIPID PANEL
Chol/HDL Ratio: 2.9 ratio (ref 0.0–5.0)
Cholesterol, Total: 110 mg/dL (ref 100–199)
HDL: 38 mg/dL — ABNORMAL LOW (ref >39)
LDL Chol Calc (NIH): 46 mg/dL (ref 0–99)
Triglycerides: 155 mg/dL — ABNORMAL HIGH (ref 0–149)
VLDL Cholesterol Cal: 26 mg/dL (ref 5–40)

## 2019-07-20 LAB — HEMOGLOBIN A1C
Est. average glucose Bld gHb Est-mCnc: 217 mg/dL
Hgb A1c MFr Bld: 9.2 % — ABNORMAL HIGH (ref 4.8–5.6)

## 2019-07-20 MED ORDER — TADALAFIL 20 MG PO TABS: ORAL_TABLET | ORAL | 11 refills | Status: DC

## 2019-07-20 NOTE — Patient Instructions (Signed)
° ° ° °  If you have lab work done today you will be contacted with your lab results within the next 2 weeks.  If you have not heard from us then please contact us. The fastest way to get your results is to register for My Chart. ° ° °IF you received an x-ray today, you will receive an invoice from [facility]. Please contact [facility] at [phone] with questions or concerns regarding your invoice.  ° °IF you received labwork today, you will receive an invoice from [facility]. Please contact [facility] at [phone] with questions or concerns regarding your invoice.  ° °Our billing staff will not be able to assist you with questions regarding bills from these companies. ° °You will be contacted with the lab results as soon as they are available. The fastest way to get your results is to activate your My Chart account. Instructions are located on the last page of this paperwork. If you have not heard from us regarding the results in 2 weeks, please contact this office. °  ° ° ° °

## 2019-07-20 NOTE — Progress Notes (Signed)
9/25/20208:20 AM  Jose [name] [birth year], 43 y.o., male [id number]  Chief Complaint  Patient presents with  . Follow-up    anxiety, htn, dm. Says the bp med was making his limbs swell. Lowered the dose to the original dose    HPI:   Patient is a 43 y.o. male with past medical history significant for DM2 with neuropathy, retinopathy and microalbuminuria, HLP, anxiety, ADD, tobacco use who presents today for routine follouwp  Last OV July 2020 - referred to psych due to concerns for ADD  Has seen Dr Montel Culver, psych Added trazadone for sleep and started him in adderrall, last OV 2 weeks ago, increased to 73m BID  He decreased losartan to 221mas he noticed some swelling in his ankles  cbgs fasting ~ 100 cbgs are in the 150-200s, 2PP lunch Trying to work on diet  Denies any lows Metformin [id number] 1.0.1BPglimperide 29m8maily  Requesting refill of cialis, uses prn for ED Denies any side effects  Not ready to quit smoking   Lab Results  Component Value Date   HGBA1C 8.9 (H) 03/15/2019   HGBA1C 7.4 (A) 12/12/2018   HGBA1C 7.1 (A) 09/12/2018   Lab Results  Component Value Date   LDLCALC 40 12/12/2018   CREATININE 1.13 03/15/2019   Wt Readings from Last 3 Encounters:  07/20/19 205 lb 6.4 oz (93.2 kg)  05/14/19 209 lb (94.8 kg)  05/10/19 203 lb (92.1 kg)   BP Readings from Last 3 Encounters:  07/20/19 139/88  05/14/19 (!) 176/92  05/10/19 135/83    Depression screen PHQ 2/9 07/20/2019 05/14/2019 05/10/2019  Decreased Interest 0 0 0  Down, Depressed, Hopeless 0 0 0  PHQ - 2 Score 0 0 0  Altered sleeping - - -  Tired, decreased energy - - -  Change in appetite - - -  Feeling bad or failure about yourself  - - -  Trouble concentrating - - -  Moving slowly or fidgety/restless - - -  Suicidal thoughts - - -  PHQ-9 Score - - -  Difficult doing work/chores - - -    Fall Risk  07/20/2019 05/14/2019 05/10/2019 04/12/2019 12/12/2018  Falls in the past  year? 0 1 0 0 0  Number falls in past yr: 0 0 0 0 0  Injury with Fall? 0 1 0 0 0     No Known Allergies  Prior to Admission medications   Medication Sig Start Date End Date Taking? Authorizing Provider  amphetamine-dextroamphetamine (ADDERALL) 20 MG tablet Take 1 tablet (20 mg total) by mouth 2 (two) times daily with breakfast and lunch. 07/16/19 08/15/19 Yes Pucilowski, Olgierd A, MD  atorvastatin (LIPITOR) 40 MG tablet Take 1 tablet (40 mg total) by mouth daily. 03/12/19  Yes SanRutherford GuysD  cyclobenzaprine (FLEXERIL) 10 MG tablet Take 1 tablet (10 mg total) by mouth 3 (three) times daily as needed for muscle spasms. 05/14/19  Yes Sagardia, MigInes BloomerD  diclofenac (VOLTAREN) 75 MG EC tablet TK 1 T PO BID 05/11/19  Yes [provider]  DULoxetine (CYMBALTA) 60 MG capsule Take 1 capsule (60 mg total) by mouth daily. 04/12/19  Yes SanRutherford GuysD  gabapentin (NEURONTIN) 300 MG capsule TAKE 1 TO 2 CAPSULES(300 TO 600 MG) BY MOUTH AT BEDTIME 06/06/19  Yes SanRutherford GuysD  glimepiride (AMARYL) 4 MG tablet Take 1 tablet (4 mg total) by mouth daily before breakfast. 06/19/19  Yes SanRutherford GuysD  losartan (COZAAR) 50 MG tablet Take 1 tablet (50 mg total) by mouth daily. 04/13/19  Yes Rutherford Guys, MD  metFORMIN (GLUCOPHAGE-XR) 500 MG 24 hr tablet TAKE 4 TABLETS(2000 MG) BY MOUTH DAILY WITH BREAKFAST 05/23/19  Yes Rutherford Guys, MD  Ranibizumab (LUCENTIS) 0.3 MG/0.05ML SOLN by Intravitreal route.   Yes [provider]  tadalafil (ADCIRCA/CIALIS) 20 MG tablet TK 1 T PO  PO QD PRN FOR ERECTILE DYSFUNCTION 07/07/18  Yes [provider]  tobramycin (TOBREX) 0.3 % ophthalmic solution INSTILL 1 DROP IN BOTH EYES 4 TIMES A DAY BEGIN 1 DAY  PRIOR TO TREATMENT CONTINUE THE DAY OF TREATMENT AND ONE FULL DAY AFTER TREATMENT 07/05/18  Yes [provider]  traMADol (ULTRAM) 50 MG tablet Take 1 tablet (50 mg total) by mouth every 8 (eight) hours as needed.  05/14/19  Yes Horald Pollen, MD  traZODone (DESYREL) 100 MG tablet Take 1 tablet (100 mg total) by mouth at bedtime as needed for sleep. 07/16/19 10/14/19 Yes Pucilowski, Marchia Bond, MD  TRULICITY 1.5 LK/4.4WN SOPN INJECT 1 DOSE INTO THE SKIN ONCE WEEKLY 05/16/19  Yes Rutherford Guys, MD    Past Medical History:  Diagnosis Date  . GAD (generalized anxiety disorder)   . Hyperlipidemia   . Type II diabetes mellitus with complication, uncontrolled (HCC)    retinopathy, neuropathy, microalbuminuria    Past Surgical History:  Procedure Laterality Date  . APPENDECTOMY    . HERNIA REPAIR      Social History   Tobacco Use  . Smoking status: Current Every Day Smoker    Packs/day: 1.00    Years: 20.00    Pack years: 20.00    Types: Cigarettes  . Smokeless tobacco: Never Used  Substance Use Topics  . Alcohol use: Not Currently    Family History  Problem Relation Age of Onset  . Diabetes Mother   . Hyperlipidemia Mother   . Stroke Mother   . Diabetes Father   . Hyperlipidemia Brother   . Stroke Brother   . ADD / ADHD Brother   . ADD / ADHD Son     Review of Systems  Constitutional: Negative for chills and fever.  Respiratory: Positive for cough (chronic, stable). Negative for shortness of breath.   Cardiovascular: Negative for chest pain, palpitations and leg swelling.  Gastrointestinal: Negative for abdominal pain, nausea and vomiting.   Per hpi  OBJECTIVE:  Today's Vitals   07/20/19 0816  BP: 139/88  Pulse: (!) 104  Temp: 98.6 F (37 C)  SpO2: 98%  Weight: 205 lb 6.4 oz (93.2 kg)  Height: _0  (1.778 m)   Body mass index is 29.47 kg/m.   Physical Exam Vitals signs and nursing note reviewed.  Constitutional:      Appearance: He is well-developed.  HENT:     Head: Normocephalic and atraumatic.  Eyes:     Conjunctiva/sclera: Conjunctivae normal.     Pupils: Pupils are equal, round, and reactive to light.  Neck:     Musculoskeletal: Neck supple.   Cardiovascular:     Rate and Rhythm: Normal rate and regular rhythm.     Heart sounds: No murmur. No friction rub. No gallop.   Pulmonary:     Effort: Pulmonary effort is normal.     Breath sounds: Normal breath sounds. No wheezing or rales.  Skin:    General: Skin is warm and dry.  Neurological:     Mental Status: He is alert and oriented  to person, place, and time.      Diabetic Foot Form - Detailed   Diabetic Foot Exam - detailed Can the patient see the bottom of their feet?: No Are the shoes appropriate in style and fit?: No Is there swelling or and abnormal foot shape?: Yes Is there a claw toe deformity?: No Is there elevated skin temparature?: No Is there foot or ankle muscle weakness?: No Normal Range of Motion: No Semmes-Weinstein Monofilament Test   Comments: Did not feel much with he filament on both feet     No results found for this or any previous visit (from the past 24 hour(s)).  No results found.   ASSESSMENT and PLAN  1. Type 2 diabetes mellitus with diabetic polyneuropathy, without long-term current use of insulin ([place]) Checking labs today, medications will be adjusted as needed. Increase glimperide, patient not interested in invokana which is what is covered by his insurance - Lipid panel - CMP14+EGFR - Hemoglobin A1c - Ambulatory referral to Podiatry  2. Hyperlipidemia, unspecified hyperlipidemia type Checking labs today, medications will be adjusted as needed.  - Lipid panel - CMP14+EGFR  3. Microalbuminuria due to type 2 diabetes mellitus ([place]) Discussed trial of increasing losartan to 25m again, as peripheral edema usually not associated with this medication  4. GAD (generalized anxiety disorder) 5. Adult ADHD Managed by psych  6. Tobacco use disorder Pre-contemplative  7. Need for prophylactic vaccination and inoculation against influenza - Flu Vaccine QUAD 36+ mos IM  Other orders - tadalafil (CIALIS) 20 MG tablet; TK 1 T PO  PO  QD PRN FOR ERECTILE DYSFUNCTION  Return in about 3 months (around 10/19/2019).    IRutherford Guys MD [facility] [id number].  [phone] [phone]

## 2019-07-31 MED ORDER — CANAGLIFLOZIN 300 MG PO TABS: 300.0000 mg | ORAL_TABLET | Freq: Every day | ORAL | 5 refills | Status: DC

## 2019-07-31 NOTE — Addendum Note (Signed)
Addended by: Rutherford Guys on: 07/31/2019 08:29 AM   Modules accepted: Orders

## 2019-08-09 ENCOUNTER — Telehealth: Payer: Self-pay | Admitting: Family Medicine

## 2019-08-09 NOTE — Telephone Encounter (Signed)
Pt was denied the medication tadalafil by his insurance company. I spoke with [facility] and there are no alternative formularies.

## 2019-08-10 NOTE — Telephone Encounter (Signed)
Nothing further to do. I advise use of good rx.

## 2019-08-16 ENCOUNTER — Other Ambulatory Visit: Payer: Self-pay | Admitting: Family Medicine

## 2019-08-16 DIAGNOSIS — E119 Type 2 diabetes mellitus without complications: Secondary | ICD-10-CM

## 2019-08-17 ENCOUNTER — Other Ambulatory Visit: Payer: Self-pay | Admitting: Family Medicine

## 2019-08-17 DIAGNOSIS — E119 Type 2 diabetes mellitus without complications: Secondary | ICD-10-CM

## 2019-08-20 ENCOUNTER — Telehealth (HOSPITAL_COMMUNITY): Payer: Self-pay

## 2019-08-20 NOTE — Telephone Encounter (Signed)
Patient called requesting a refill on his Adderall 20mg  to be sent to [facility] on the [address] of [address] in [place]. He has a scheduled appointment on 08/29/19. Thank you

## 2019-08-22 ENCOUNTER — Other Ambulatory Visit (HOSPITAL_COMMUNITY): Payer: Self-pay | Admitting: Psychiatry

## 2019-08-22 ENCOUNTER — Telehealth (HOSPITAL_COMMUNITY): Payer: Self-pay

## 2019-08-22 MED ORDER — AMPHETAMINE-DEXTROAMPHETAMINE 20 MG PO TABS: 20.0000 mg | ORAL_TABLET | Freq: Two times a day (BID) | ORAL | 0 refills | Status: DC

## 2019-08-22 NOTE — Telephone Encounter (Signed)
Done

## 2019-08-22 NOTE — Telephone Encounter (Signed)
Patient called requesting a refill on his Adderall 20mg  to be sent to [facility] on [address] in [place]. Thank you.

## 2019-08-22 NOTE — Telephone Encounter (Signed)
Done already

## 2019-08-24 NOTE — Telephone Encounter (Signed)
LVM to notify patient

## 2019-08-29 ENCOUNTER — Ambulatory Visit (INDEPENDENT_AMBULATORY_CARE_PROVIDER_SITE_OTHER): Payer: 59 | Admitting: Psychiatry

## 2019-08-29 ENCOUNTER — Other Ambulatory Visit: Payer: Self-pay

## 2019-08-29 DIAGNOSIS — F909 Attention-deficit hyperactivity disorder, unspecified type: Secondary | ICD-10-CM | POA: Diagnosis not present

## 2019-08-29 DIAGNOSIS — F411 Generalized anxiety disorder: Secondary | ICD-10-CM

## 2019-08-29 MED ORDER — DULOXETINE HCL 60 MG PO CPEP: 60.0000 mg | ORAL_CAPSULE | Freq: Every day | ORAL | 1 refills | Status: DC

## 2019-08-29 MED ORDER — AMPHETAMINE-DEXTROAMPHETAMINE 20 MG PO TABS: 20.0000 mg | ORAL_TABLET | Freq: Two times a day (BID) | ORAL | 0 refills | Status: DC

## 2019-08-29 MED ORDER — TRAZODONE HCL 100 MG PO TABS: 100.0000 mg | ORAL_TABLET | Freq: Every evening | ORAL | 0 refills | Status: DC | PRN

## 2019-08-29 NOTE — Progress Notes (Signed)
BH MD/PA/NP OP Progress Note  08/29/2019 3:46 PM AHMIRE SEEPERSAD  MRN:  [id number] Interview was conducted by phone and I verified that I was speaking with the correct person using two identifiers. I discussed the limitations of evaluation and management by telemedicine and  the availability of in person appointments. Patient expressed understanding and agreed to proceed.  Chief Complaint: Some anxiety.  HPI: 43 yo male with a hx of anxiety and some depression who has been troughout his life struggling with problems focusing, loosing interest, task completion as well as excessive worrying and occasional panic attacks and depression secondary to his focusing problems. These have impinged on his ability to function effectively at work (but also at home) and in the past he has lost two jobs because of performance deficiencies (struggled with writing proposals). 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 He has no component of hyperactivity. Joaquim reports in addition to ADD symptoms also initial and middle insomnia, racing thoughts, excessive worrying and occasional panic attacks. He denies feeling hopeless, helpless, suicidal. His appetite is normal. Wew continued duloxetine, added trazodone for insomnia which appears to work well and Adderall 20 mg bid. He admits to some anxiety driven by general (pandemia) and personal circumstances. He and his spouse are attending couples therapy which is going well. He is also in a process of buying a house.  Visit Diagnosis:    ICD-10-CM   1. GAD (generalized anxiety disorder)  F41.1   2. Adult ADHD  F90.9     Past Psychiatric History: Please see intake H&P.  Past Medical History:  Past Medical History:  Diagnosis Date  . GAD (generalized anxiety disorder)   . Hyperlipidemia   . Type II diabetes mellitus with complication,  uncontrolled (HCC)    retinopathy, neuropathy, microalbuminuria    Past Surgical History:  Procedure Laterality Date  . APPENDECTOMY    . HERNIA REPAIR      Family Psychiatric History: Reviewed.  Family History:  Family History  Problem Relation Age of Onset  . Diabetes Mother   . Hyperlipidemia Mother   . Stroke Mother   . Diabetes Father   . Hyperlipidemia Brother   . Stroke Brother   . ADD / ADHD Brother   . ADD / ADHD Son     Social History:  Social History   Socioeconomic History  . Marital status: Significant Other    Spouse name: Not on file  . Number of children: 2  . Years of education: Not on file  . Highest education level: Bachelor's degree (e.g., BA, AB, BS)  Occupational History  . Occupation: Multimedia programmer: [employer]  Social Needs  . Financial resource strain: Not hard at all  . Food insecurity    Worry: Never true    Inability: Never true  . Transportation needs    Medical: No    Non-medical: No  Tobacco Use  . Smoking status: Current Every Day Smoker    Packs/day: 1.00    Years: 20.00    Pack years: 20.00    Types: Cigarettes  . Smokeless tobacco: Never Used  Substance and Sexual Activity  . Alcohol use: Not Currently  . Drug use: Never  . Sexual activity: Yes    Partners: Female    Birth control/protection: None    Comment: with monogamous partner  Lifestyle  .  Physical activity    Days per week: 4 days    Minutes per session: 20 min  . Stress: Not on file  Relationships  . Social Herbalist on phone: Once a week    Gets together: Once a week    Attends religious service: Never    Active member of club or organization: No    Attends meetings of clubs or organizations: Never    Relationship status: Living with partner  Other Topics Concern  . Not on file  Social History Narrative   Pt has lived majority of life in [place]. Lives at home with partner, 2 kids, 8 cats, and 1 dog.      Allergies: No Known Allergies  Metabolic Disorder Labs: Lab Results  Component Value Date   HGBA1C 9.2 (H) 07/20/2019   No results found for: PROLACTIN Lab Results  Component Value Date   CHOL 110 07/20/2019   TRIG 155 (H) 07/20/2019   HDL 38 (L) 07/20/2019   CHOLHDL 2.9 07/20/2019   LDLCALC 46 07/20/2019   LDLCALC 40 12/12/2018   Lab Results  Component Value Date   TSH 3.070 03/15/2019   TSH 1.990 12/12/2018    Therapeutic Level Labs: No results found for: LITHIUM No results found for: VALPROATE No components found for:  CBMZ  Current Medications: Current Outpatient Medications  Medication Sig Dispense Refill  . [START ON 09/21/2019] amphetamine-dextroamphetamine (ADDERALL) 20 MG tablet Take 1 tablet (20 mg total) by mouth 2 (two) times daily with breakfast and lunch. 60 tablet 0  . atorvastatin (LIPITOR) 40 MG tablet Take 1 tablet (40 mg total) by mouth daily. 90 tablet 3  . canagliflozin (INVOKANA) 300 MG TABS tablet Take 1 tablet (300 mg total) by mouth daily before breakfast. 30 tablet 5  . cyclobenzaprine (FLEXERIL) 10 MG tablet Take 1 tablet (10 mg total) by mouth 3 (three) times daily as needed for muscle spasms. 30 tablet 0  . diclofenac (VOLTAREN) 75 MG EC tablet TK 1 T PO BID    . DULoxetine (CYMBALTA) 60 MG capsule Take 1 capsule (60 mg total) by mouth daily. 90 capsule 1  . gabapentin (NEURONTIN) 300 MG capsule TAKE 1 TO 2 CAPSULES(300 TO 600 MG) BY MOUTH AT BEDTIME 180 capsule 1  . glimepiride (AMARYL) 4 MG tablet Take 1 tablet (4 mg total) by mouth daily before breakfast. 90 tablet 0  . losartan (COZAAR) 50 MG tablet TAKE 1 TABLET(50 MG) BY MOUTH DAILY 90 tablet 1  . metFORMIN (GLUCOPHAGE-XR) 500 MG 24 hr tablet TAKE 4 TABLETS(2000 MG) BY MOUTH DAILY WITH BREAKFAST 368 tablet 0  . Ranibizumab (LUCENTIS) 0.3 MG/0.05ML SOLN by Intravitreal route.    . tadalafil (CIALIS) 20 MG tablet TK 1 T PO  PO QD PRN FOR ERECTILE DYSFUNCTION 10 tablet 11  . tobramycin  (TOBREX) 0.3 % ophthalmic solution INSTILL 1 DROP IN BOTH EYES 4 TIMES A DAY BEGIN 1 DAY  PRIOR TO TREATMENT CONTINUE THE DAY OF TREATMENT AND ONE FULL DAY AFTER TREATMENT  5  . traMADol (ULTRAM) 50 MG tablet Take 1 tablet (50 mg total) by mouth every 8 (eight) hours as needed. 12 tablet 0  . traZODone (DESYREL) 100 MG tablet Take 1 tablet (100 mg total) by mouth at bedtime as needed for sleep. 90 tablet 0  . TRULICITY 1.5 0000000 SOPN INJECT 1 DOSE INTO THE SKIN ONCE WEEKLY 6 mL 1   No current facility-administered medications for this visit.  Psychiatric Specialty Exam: Review of Systems  Psychiatric/Behavioral: The patient is nervous/anxious.   All other systems reviewed and are negative.   There were no vitals taken for this visit.There is no height or weight on file to calculate BMI.  General Appearance: NA  Eye Contact:  NA  Speech:  Clear and Coherent and Normal Rate  Volume:  Normal  Mood:  Anxious  Affect:  NA  Thought Process:  Goal Directed and Linear  Orientation:  Full (Time, Place, and Person)  Thought Content: Logical   Suicidal Thoughts:  No  Homicidal Thoughts:  No  Memory:  Immediate;   Good Recent;   Good Remote;   Good  Judgement:  Good  Insight:  Good  Psychomotor Activity:  NA  Concentration:  Concentration: Good  Recall:  Good  Fund of Knowledge: Good  Language: Good  Akathisia:  Negative  Handed:  Right  AIMS (if indicated): not done  Assets:  Communication Skills Desire for Improvement Financial Resources/Insurance Housing Social Support Vocational/Educational  ADL's:  Intact  Cognition: WNL  Sleep:  Fair   Screenings: GAD-7     Office Visit from 05/10/2019 in Primary Care at [facility] from 04/12/2019 in Primary Care at [facility]  Total GAD-7 Score  11  19    PHQ2-9     Office Visit from 07/20/2019 in Primary Care at [facility] from 05/14/2019 in Primary Care at [facility] from 05/10/2019 in Primary Care at  [facility] from 04/12/2019 in [place] at [facility] from 09/12/2018 in Primary Care at [facility]  PHQ-2 Total Score  0  0  0  2  0  PHQ-9 Total Score  -  -  -  11  -       Assessment and Plan: 43 yo male with a hx of anxiety and some depression who has been troughout his life struggling with problems focusing, loosing interest, task completion as well as excessive worrying and occasional panic attacks and depression secondary to his focusing problems. These have impinged on his ability to function effectively at work (but also at home) and in the past he has lost two jobs because of performance deficiencies (struggled with writing proposals). 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 He has no component of hyperactivity. Navneet reports in addition to ADD symptoms also initial and middle insomnia, racing thoughts, excessive worrying and occasional panic attacks. He denies feeling hopeless, helpless, suicidal. His appetite is normal. Wew continued duloxetine, added trazodone for insomnia which appears to work well and Adderall 20 mg bid. He admits to some anxiety driven by general (pandemia) and personal circumstances. He and his spouse are attending couples therapy which is going well. He is also in a process of buying a house.  Dx: GAD, ADHD adult  Plan: Continue duloxetine 60 mg, trazodone 50-100 mg prn sleep and Adderall 20 mg bid for ADD. Next appointment in mid January.The plan was discussed with patient who had an opportunity to ask questions and these were all answered. I spend 25 minutes inphone consultation with the patient    Stephanie Acre, MD 08/29/2019, 3:46 PM

## 2019-09-07 ENCOUNTER — Other Ambulatory Visit: Payer: Self-pay

## 2019-09-07 ENCOUNTER — Encounter: Payer: Self-pay | Admitting: Podiatry

## 2019-09-07 ENCOUNTER — Ambulatory Visit (INDEPENDENT_AMBULATORY_CARE_PROVIDER_SITE_OTHER): Payer: 59 | Admitting: Podiatry

## 2019-09-07 DIAGNOSIS — E1142 Type 2 diabetes mellitus with diabetic polyneuropathy: Secondary | ICD-10-CM | POA: Diagnosis not present

## 2019-09-07 DIAGNOSIS — M79674 Pain in right toe(s): Secondary | ICD-10-CM

## 2019-09-07 DIAGNOSIS — M79675 Pain in left toe(s): Secondary | ICD-10-CM

## 2019-09-07 DIAGNOSIS — B351 Tinea unguium: Secondary | ICD-10-CM

## 2019-09-07 DIAGNOSIS — E119 Type 2 diabetes mellitus without complications: Secondary | ICD-10-CM | POA: Diagnosis not present

## 2019-09-07 NOTE — Progress Notes (Signed)
Subjective:  Patient ID: Jose [name], male    DOB: [birth year],  MRN: [id number]  Chief Complaint  Patient presents with  . New Patient (Initial Visit)    diabetic foot care, nail trim with 2-3 minor spots on bilateral feet. no pain, no pains or questions.    43 y.o. male returns for the above complaint.  Patient is here for diabetic foot care.  Patient states that his toenails have been bothering him.  He is also here for diabetic foot exam.  He denies any other acute complaints.  He denies any neurologic symptoms.    Subjective: Jose [name] presents today referred by Rutherford Guys, MD for diabetic foot evaluation.  Patient relates 5 year history of diabetes.  Patient denies any history of foot wounds.  Patient denies any history of numbness, tingling, burning, pins/needles sensations.  Past Medical History:  Diagnosis Date  . GAD (generalized anxiety disorder)   . Hyperlipidemia   . Type II diabetes mellitus with complication, uncontrolled (HCC)    retinopathy, neuropathy, microalbuminuria    Patient Active Problem List   Diagnosis Date Noted  . Microalbuminuria due to type 2 diabetes mellitus ([place]) 07/20/2019  . GAD (generalized anxiety disorder) 06/18/2019  . Adult ADHD 06/18/2019  . Diabetic retinopathy ([place]) 07/13/2018  . Type 2 diabetes mellitus with diabetic polyneuropathy, without long-term current use of insulin ([place]) 05/17/2018  . Tobacco use disorder 05/17/2018  . Hyperlipidemia 05/17/2018    Past Surgical History:  Procedure Laterality Date  . APPENDECTOMY    . HERNIA REPAIR      Current Outpatient Medications on File Prior to Visit  Medication Sig Dispense Refill  . [START ON 09/21/2019] amphetamine-dextroamphetamine (ADDERALL) 20 MG tablet Take 1 tablet (20 mg total) by mouth 2 (two) times daily with breakfast and lunch. 60 tablet 0  . atorvastatin (LIPITOR) 40 MG tablet Take 1 tablet (40 mg total) by mouth daily. 90 tablet 3  .  canagliflozin (INVOKANA) 300 MG TABS tablet Take 1 tablet (300 mg total) by mouth daily before breakfast. 30 tablet 5  . cyclobenzaprine (FLEXERIL) 10 MG tablet Take 1 tablet (10 mg total) by mouth 3 (three) times daily as needed for muscle spasms. 30 tablet 0  . diclofenac (VOLTAREN) 75 MG EC tablet TK 1 T PO BID    . DULoxetine (CYMBALTA) 60 MG capsule Take 1 capsule (60 mg total) by mouth daily. 90 capsule 1  . gabapentin (NEURONTIN) 300 MG capsule TAKE 1 TO 2 CAPSULES(300 TO 600 MG) BY MOUTH AT BEDTIME 180 capsule 1  . glimepiride (AMARYL) 4 MG tablet Take 1 tablet (4 mg total) by mouth daily before breakfast. 90 tablet 0  . losartan (COZAAR) 50 MG tablet TAKE 1 TABLET(50 MG) BY MOUTH DAILY 90 tablet 1  . metFORMIN (GLUCOPHAGE-XR) 500 MG 24 hr tablet TAKE 4 TABLETS(2000 MG) BY MOUTH DAILY WITH BREAKFAST 368 tablet 0  . Ranibizumab (LUCENTIS) 0.3 MG/0.05ML SOLN by Intravitreal route.    . tadalafil (CIALIS) 20 MG tablet TK 1 T PO  PO QD PRN FOR ERECTILE DYSFUNCTION 10 tablet 11  . tobramycin (TOBREX) 0.3 % ophthalmic solution INSTILL 1 DROP IN BOTH EYES 4 TIMES A DAY BEGIN 1 DAY  PRIOR TO TREATMENT CONTINUE THE DAY OF TREATMENT AND ONE FULL DAY AFTER TREATMENT  5  . traMADol (ULTRAM) 50 MG tablet Take 1 tablet (50 mg total) by mouth every 8 (eight) hours as needed. 12 tablet 0  . traZODone (DESYREL) 100 MG  tablet Take 1 tablet (100 mg total) by mouth at bedtime as needed for sleep. 90 tablet 0  . TRULICITY 1.5 0000000 SOPN INJECT 1 DOSE INTO THE SKIN ONCE WEEKLY 6 mL 1   No current facility-administered medications on file prior to visit.      No Known Allergies  Social History   Occupational History  . Occupation: Multimedia programmer: [employer]  Tobacco Use  . Smoking status: Current Every Day Smoker    Packs/day: 1.00    Years: 20.00    Pack years: 20.00    Types: Cigarettes  . Smokeless tobacco: Never Used  Substance and Sexual Activity  . Alcohol use:  Not Currently  . Drug use: Never  . Sexual activity: Yes    Partners: Female    Birth control/protection: None    Comment: with monogamous partner    Family History  Problem Relation Age of Onset  . Diabetes Mother   . Hyperlipidemia Mother   . Stroke Mother   . Diabetes Father   . Hyperlipidemia Brother   . Stroke Brother   . ADD / ADHD Brother   . ADD / ADHD Son     Immunization History  Administered Date(s) Administered  . Influenza,inj,Quad PF,6+ Mos 07/11/2018, 07/20/2019  . Influenza-Unspecified 07/11/2018  . Pneumococcal Polysaccharide-23 07/11/2018    Review of systems: Positive Findings in bold print.  Constitutional:  chills, fatigue, fever, sweats, weight change Communication: Optometrist, sign Ecologist, hand writing, iPad/Android device Head: headaches, head injury Eyes: changes in vision, eye pain, glaucoma, cataracts, macular degeneration, diplopia, glare,  light sensitivity, eyeglasses or contacts, blindness Ears nose mouth throat: hearing impaired, hearing aids,  ringing in ears, deaf, sign language,  vertigo, nosebleeds,  rhinitis,  cold sores, snoring, swollen glands Cardiovascular: HTN, edema, arrhythmia, pacemaker in place, defibrillator in place, chest pain/tightness, chronic anticoagulation, blood clot, heart failure, MI Peripheral Vascular: leg cramps, varicose veins, blood clots, lymphedema, varicosities Respiratory:  asthma, difficulty breathing, denies congestion, SOB, wheezing, cough, emphysema Gastrointestinal: change in appetite or weight, abdominal pain, constipation, diarrhea, nausea, vomiting, vomiting blood, change in bowel habits, abdominal pain, jaundice, rectal bleeding, hemorrhoids, GERD Genitourinary:  nocturia,  pain on urination, polyuria,  blood in urine, Foley catheter, urinary urgency, ESRD on hemodialysis Musculoskeletal: amputation, cramping, stiff joints, painful joints, decreased joint motion, fractures, OA, gout,  hemiplegia, paraplegia, uses cane, wheelchair bound, uses walker, uses rollator Skin: +changes in toenails, color change, dryness, itching, mole changes,  rash, wound(s) Neurological: headaches, numbness in feet, paresthesias in feet, burning in feet, fainting,  seizures, change in speech, migraines, memory problems/poor historian, cerebral palsy, weakness, paralysis, CVA, TIA Endocrine: diabetes, hypothyroidism, hyperthyroidism,  goiter, dry mouth, flushing, heat intolerance, cold intolerance,  excessive thirst, denies polyuria,  nocturia Hematological:  easy bleeding, excessive bleeding, easy bruising, enlarged lymph nodes, on long term blood thinner, history of past transusions Allergy/immunological:  hives, eczema, frequent infections, multiple drug allergies, seasonal allergies, transplant recipient, multiple food allergies Psychiatric:  anxiety, depression, mood disorder, suicidal ideations, hallucinations, insomnia  Objective: There were no vitals filed for this visit. Vascular Examination: Capillary refill time < 3 secs x 10 digits.  Dorsalis pedis pulses palapble.  Posterior tibial pulses palapble.  Digital hair not present  x 10 digits.  Skin temperature gradient WNL b/l.  Dermatological Examination: Skin with normal turgor, texture and tone b/l  Toenails 1-5 b/l discolored, thick, dystrophic with subungual debris and pain with palpation to nailbeds due to thickness of  nails.  Musculoskeletal: Muscle strength 5/5 to all LE muscle groups.  Neurological: Sensation intact with 10 gram monofilament.  Vibratory sensation intact.  Assessment: 1. NIDDM 2. Encounter for diabetic foot examination  Plan: 1. Discussed diabetic foot care principles. Literature dispensed on today. 2. Patient to continue soft, supportive shoe gear daily. 3. Patient to report any pedal injuries to medical professional immediately. 4. Follow up one year. 5. Patient/POA to call should there be a  concern in the interim.   Patient was evaluated and treated and all questions answered.  Onychomycosis with pain  -Nails palliatively debrided as below. -Educated on self-care  Procedure: Nail Debridement Rationale: pain  Type of Debridement: manual, sharp debridement. Instrumentation: Nail nipper, rotary burr. Number of Nails: 10  Procedures and Treatment: Consent by patient was obtained for treatment procedures. The patient understood the discussion of treatment and procedures well. All questions were answered thoroughly reviewed. Debridement of mycotic and hypertrophic toenails, 1 through 5 bilateral and clearing of subungual debris. No ulceration, no infection noted.  Return Visit-Office Procedure: Patient instructed to return to the office for a follow up visit 3 months for continued evaluation and treatment.  Boneta Lucks, DPM   No follow-ups on file.

## 2019-10-03 ENCOUNTER — Other Ambulatory Visit: Payer: Self-pay | Admitting: Orthopedic Surgery

## 2019-10-10 ENCOUNTER — Other Ambulatory Visit: Payer: Self-pay | Admitting: Family Medicine

## 2019-10-10 MED ORDER — CANAGLIFLOZIN 300 MG PO TABS: 300.0000 mg | ORAL_TABLET | Freq: Every day | ORAL | 0 refills | Status: DC

## 2019-10-10 NOTE — Telephone Encounter (Signed)
Medication Refill - Medication: canagliflozin (INVOKANA) 300 MG TABS tablet [id number] day supply  Patient has no medication for tomorrow .   Preferred Pharmacy (with phone number or street name):  [facility] [phone] - [address], [place] - [address]  [address] [place] [address]  Phone: [phone] Fax: [phone]     Agent: Please be advised that RX refills may take up to 3 business days. We ask that you follow-up with your pharmacy.

## 2019-10-10 NOTE — Telephone Encounter (Signed)
Phone call to pt. To discuss request for refill on Invokana.  Advised pt. That this medication was reordered on 07/31/19; #30; refills x 5.  The pt. Stated due to insurance requirements, he needs a 90 day supply.  Advised since due for appt. 12/28, will authorize 90 day supply.  Pt. Verb. Understanding, and agrees with plan.

## 2019-10-16 ENCOUNTER — Encounter (HOSPITAL_BASED_OUTPATIENT_CLINIC_OR_DEPARTMENT_OTHER): Payer: Self-pay | Admitting: Orthopedic Surgery

## 2019-10-16 ENCOUNTER — Other Ambulatory Visit: Payer: Self-pay

## 2019-10-22 ENCOUNTER — Other Ambulatory Visit (HOSPITAL_COMMUNITY)
Admission: RE | Admit: 2019-10-22 | Discharge: 2019-10-22 | Disposition: A | Discharge status: Home / Self Care | Payer: 59 | Source: Ambulatory Visit | Attending: Orthopedic Surgery | Admitting: Orthopedic Surgery

## 2019-10-22 ENCOUNTER — Other Ambulatory Visit (HOSPITAL_COMMUNITY): Payer: Self-pay | Admitting: Psychiatry

## 2019-10-22 ENCOUNTER — Telehealth (HOSPITAL_COMMUNITY): Payer: Self-pay | Admitting: *Deleted

## 2019-10-22 ENCOUNTER — Ambulatory Visit: Payer: 59 | Admitting: Family Medicine

## 2019-10-22 DIAGNOSIS — Z20828 Contact with and (suspected) exposure to other viral communicable diseases: Secondary | ICD-10-CM | POA: Diagnosis not present

## 2019-10-22 DIAGNOSIS — Z01812 Encounter for preprocedural laboratory examination: Secondary | ICD-10-CM | POA: Insufficient documentation

## 2019-10-22 MED ORDER — AMPHETAMINE-DEXTROAMPHETAMINE 20 MG PO TABS: 20.0000 mg | ORAL_TABLET | Freq: Two times a day (BID) | ORAL | 0 refills | Status: DC

## 2019-10-22 NOTE — Telephone Encounter (Signed)
Done

## 2019-10-22 NOTE — Telephone Encounter (Signed)
Pt left message requesting refill of his Adderall 20mg .   amphetamine-dextroamphetamine (ADDERALL) 20 MG tablet(Expired) 20 mg, 2 times daily with breakfast and lunch 0 ordered        Summary: Take 1 tablet (20 mg total) by mouth 2 (two) times daily with breakfast and lunch., Starting Fri 09/21/2019, Until Sun 10/21/2019, Normal     Pt has an upcoming appointment on 11/14/19.

## 2019-10-23 ENCOUNTER — Encounter (HOSPITAL_BASED_OUTPATIENT_CLINIC_OR_DEPARTMENT_OTHER)
Admission: RE | Admit: 2019-10-23 | Discharge: 2019-10-23 | Disposition: A | Discharge status: Home / Self Care | Payer: 59 | Source: Ambulatory Visit | Attending: Orthopedic Surgery | Admitting: Orthopedic Surgery

## 2019-10-23 ENCOUNTER — Other Ambulatory Visit: Payer: Self-pay

## 2019-10-23 DIAGNOSIS — M65321 Trigger finger, right index finger: Secondary | ICD-10-CM | POA: Diagnosis present

## 2019-10-23 DIAGNOSIS — E785 Hyperlipidemia, unspecified: Secondary | ICD-10-CM | POA: Diagnosis not present

## 2019-10-23 DIAGNOSIS — F909 Attention-deficit hyperactivity disorder, unspecified type: Secondary | ICD-10-CM | POA: Diagnosis not present

## 2019-10-23 DIAGNOSIS — F411 Generalized anxiety disorder: Secondary | ICD-10-CM | POA: Diagnosis not present

## 2019-10-23 DIAGNOSIS — E1140 Type 2 diabetes mellitus with diabetic neuropathy, unspecified: Secondary | ICD-10-CM | POA: Diagnosis not present

## 2019-10-23 DIAGNOSIS — Z833 Family history of diabetes mellitus: Secondary | ICD-10-CM | POA: Diagnosis not present

## 2019-10-23 DIAGNOSIS — F17210 Nicotine dependence, cigarettes, uncomplicated: Secondary | ICD-10-CM | POA: Diagnosis not present

## 2019-10-23 DIAGNOSIS — I10 Essential (primary) hypertension: Secondary | ICD-10-CM | POA: Diagnosis not present

## 2019-10-23 DIAGNOSIS — E11319 Type 2 diabetes mellitus with unspecified diabetic retinopathy without macular edema: Secondary | ICD-10-CM | POA: Diagnosis not present

## 2019-10-23 LAB — BASIC METABOLIC PANEL
Anion gap: 8 (ref 5–15)
BUN: 20 mg/dL (ref 6–20)
CO2: 26 mmol/L (ref 22–32)
Calcium: 9.6 mg/dL (ref 8.9–10.3)
Chloride: 104 mmol/L (ref 98–111)
Creatinine, Ser: 1.11 mg/dL (ref 0.61–1.24)
GFR calc Af Amer: >60 mL/min (ref >60)
GFR calc non Af Amer: >60 mL/min (ref >60)
Glucose, Bld: 141 mg/dL — ABNORMAL HIGH (ref 70–99)
Potassium: 5.2 mmol/L — ABNORMAL HIGH (ref 3.5–5.1)
Sodium: 138 mmol/L (ref 135–145)

## 2019-10-23 LAB — NOVEL CORONAVIRUS, NAA (HOSP ORDER, SEND-OUT TO REF LAB; TAT 18-24 HRS): SARS-CoV-2, NAA: NOT DETECTED

## 2019-10-23 NOTE — Progress Notes (Signed)
      Enhanced Recovery after Surgery for Orthopedics Enhanced Recovery after Surgery is a protocol used to improve the stress on your body and your recovery after surgery.  Patient Instructions  . The night before surgery:  o No food after midnight. ONLY clear liquids after midnight  . The day of surgery (if you do NOT have diabetes):  o Drink ONE (1) Pre-Surgery Clear Ensure as directed.   o This drink was given to you during your hospital  pre-op appointment visit. o The pre-op nurse will instruct you on the time to drink the  Pre-Surgery Ensure depending on your surgery time. o Finish the drink at the designated time by the pre-op nurse.  o Nothing else to drink after completing the  Pre-Surgery Clear Ensure.  . The day of surgery (if you have diabetes): o Drink ONE (1) Gatorade 2 (G2) as directed. o This drink was given to you during your hospital  pre-op appointment visit.  o The pre-op nurse will instruct you on the time to drink the   Gatorade 2 (G2) depending on your surgery time. o Color of the Gatorade may vary. Red is not allowed. o Nothing else to drink after completing the  Gatorade 2 (G2).         If you have questions, please contact your surgeon's office.  Surgical soap given to patient, instructions given, patient verbalized understanding.   

## 2019-10-24 NOTE — Anesthesia Preprocedure Evaluation (Addendum)
Anesthesia Evaluation  Patient identified by MRN, date of birth, ID band Patient awake    Reviewed: Allergy & Precautions, H&P , NPO status , Patient's Chart, lab work & pertinent test results, reviewed documented beta blocker date and time   Airway Mallampati: I  TM Distance: >3 FB Neck ROM: full    Dental no notable dental hx. (+) Teeth Intact, Dental Advisory Given   Pulmonary neg pulmonary ROS, Current Smoker,    Pulmonary exam normal breath sounds clear to auscultation       Cardiovascular Exercise Tolerance: Good hypertension, Pt. on medications negative cardio ROS   Rhythm:regular Rate:Normal     Neuro/Psych PSYCHIATRIC DISORDERS Anxiety  Neuromuscular disease negative neurological ROS  negative psych ROS   GI/Hepatic negative GI ROS, Neg liver ROS,   Endo/Other  negative endocrine ROSdiabetes, Type 2  Renal/GU Renal diseasenegative Renal ROS  negative genitourinary   Musculoskeletal   Abdominal   Peds  Hematology negative hematology ROS (+)   Anesthesia Other Findings   Reproductive/Obstetrics negative OB ROS                            Anesthesia Physical Anesthesia Plan  ASA: III  Anesthesia Plan: MAC and Bier Block and Bier Block-LIDOCAINE ONLY   Post-op Pain Management:    Induction:   PONV Risk Score and Plan: 1  Airway Management Planned: Mask, Natural Airway and Nasal Cannula  Additional Equipment:   Intra-op Plan:   Post-operative Plan:   Informed Consent: I have reviewed the patients History and Physical, chart, labs and discussed the procedure including the risks, benefits and alternatives for the proposed anesthesia with the patient or authorized representative who has indicated his/her understanding and acceptance.     Dental Advisory Given  Plan Discussed with: Anesthesiologist and CRNA  Anesthesia Plan Comments:        Anesthesia Quick  Evaluation

## 2019-10-25 ENCOUNTER — Ambulatory Visit (HOSPITAL_BASED_OUTPATIENT_CLINIC_OR_DEPARTMENT_OTHER): Payer: 59 | Admitting: Anesthesiology

## 2019-10-25 ENCOUNTER — Other Ambulatory Visit: Payer: Self-pay

## 2019-10-25 ENCOUNTER — Encounter (HOSPITAL_BASED_OUTPATIENT_CLINIC_OR_DEPARTMENT_OTHER): Payer: Self-pay | Admitting: Orthopedic Surgery

## 2019-10-25 ENCOUNTER — Encounter (HOSPITAL_BASED_OUTPATIENT_CLINIC_OR_DEPARTMENT_OTHER)
Admission: RE | Disposition: A | Discharge status: Home / Self Care | Payer: Self-pay | Source: Home / Self Care | Attending: Orthopedic Surgery

## 2019-10-25 ENCOUNTER — Ambulatory Visit (HOSPITAL_BASED_OUTPATIENT_CLINIC_OR_DEPARTMENT_OTHER)
Admission: RE | Admit: 2019-10-25 | Discharge: 2019-10-25 | Disposition: A | Discharge status: Home / Self Care | Payer: 59 | Attending: Orthopedic Surgery | Admitting: Orthopedic Surgery

## 2019-10-25 DIAGNOSIS — E785 Hyperlipidemia, unspecified: Secondary | ICD-10-CM | POA: Insufficient documentation

## 2019-10-25 DIAGNOSIS — F17210 Nicotine dependence, cigarettes, uncomplicated: Secondary | ICD-10-CM | POA: Insufficient documentation

## 2019-10-25 DIAGNOSIS — E1140 Type 2 diabetes mellitus with diabetic neuropathy, unspecified: Secondary | ICD-10-CM | POA: Insufficient documentation

## 2019-10-25 DIAGNOSIS — F909 Attention-deficit hyperactivity disorder, unspecified type: Secondary | ICD-10-CM | POA: Insufficient documentation

## 2019-10-25 DIAGNOSIS — M65321 Trigger finger, right index finger: Secondary | ICD-10-CM | POA: Diagnosis not present

## 2019-10-25 DIAGNOSIS — Z833 Family history of diabetes mellitus: Secondary | ICD-10-CM | POA: Insufficient documentation

## 2019-10-25 DIAGNOSIS — E11319 Type 2 diabetes mellitus with unspecified diabetic retinopathy without macular edema: Secondary | ICD-10-CM | POA: Insufficient documentation

## 2019-10-25 DIAGNOSIS — I10 Essential (primary) hypertension: Secondary | ICD-10-CM | POA: Insufficient documentation

## 2019-10-25 DIAGNOSIS — F411 Generalized anxiety disorder: Secondary | ICD-10-CM | POA: Insufficient documentation

## 2019-10-25 HISTORY — DX: Unspecified macular degeneration: H35.30

## 2019-10-25 HISTORY — DX: Unspecified background retinopathy: H35.00

## 2019-10-25 HISTORY — PX: TRIGGER FINGER RELEASE: SHX641

## 2019-10-25 LAB — GLUCOSE, CAPILLARY
Glucose-Capillary: 160 mg/dL — ABNORMAL HIGH (ref 70–99)
Glucose-Capillary: 153 mg/dL — ABNORMAL HIGH (ref 70–99)

## 2019-10-25 SURGERY — RELEASE, A1 PULLEY, FOR TRIGGER FINGER
Anesthesia: Monitor Anesthesia Care | Site: Hand | Laterality: Right

## 2019-10-25 MED ORDER — ONDANSETRON HCL 4 MG/2ML IJ SOLN: 4.0000 mg | Freq: Once | INTRAMUSCULAR | Status: DC | PRN

## 2019-10-25 MED ORDER — TRAMADOL HCL 50 MG PO TABS: 50.0000 mg | ORAL_TABLET | Freq: Four times a day (QID) | ORAL | 0 refills | Status: DC | PRN

## 2019-10-25 MED ORDER — BUPIVACAINE HCL (PF) 0.25 % IJ SOLN
INTRAMUSCULAR | Status: AC
  Filled 2019-10-25: qty 30

## 2019-10-25 MED ORDER — MIDAZOLAM HCL 5 MG/5ML IJ SOLN
INTRAMUSCULAR | Status: DC | PRN
  Administered 2019-10-25: 2 mg via INTRAVENOUS

## 2019-10-25 MED ORDER — MIDAZOLAM HCL 2 MG/2ML IJ SOLN
INTRAMUSCULAR | Status: AC
  Filled 2019-10-25: qty 2

## 2019-10-25 MED ORDER — OXYCODONE HCL 5 MG/5ML PO SOLN: 5.0000 mg | Freq: Once | ORAL | Status: DC | PRN

## 2019-10-25 MED ORDER — FENTANYL CITRATE (PF) 100 MCG/2ML IJ SOLN: 25.0000 ug | INTRAMUSCULAR | Status: DC | PRN

## 2019-10-25 MED ORDER — OXYCODONE HCL 5 MG PO TABS: 5.0000 mg | ORAL_TABLET | Freq: Once | ORAL | Status: DC | PRN

## 2019-10-25 MED ORDER — ONDANSETRON HCL 4 MG/2ML IJ SOLN
INTRAMUSCULAR | Status: AC
  Filled 2019-10-25: qty 2

## 2019-10-25 MED ORDER — MEPERIDINE HCL 25 MG/ML IJ SOLN: 6.2500 mg | INTRAMUSCULAR | Status: DC | PRN

## 2019-10-25 MED ORDER — LACTATED RINGERS IV SOLN: INTRAVENOUS | Status: DC

## 2019-10-25 MED ORDER — ONDANSETRON HCL 4 MG/2ML IJ SOLN
INTRAMUSCULAR | Status: DC | PRN
  Administered 2019-10-25: 4 mg via INTRAVENOUS

## 2019-10-25 MED ORDER — BUPIVACAINE HCL (PF) 0.5 % IJ SOLN
INTRAMUSCULAR | Status: AC
  Filled 2019-10-25: qty 30

## 2019-10-25 MED ORDER — ACETAMINOPHEN 325 MG PO TABS: 325.0000 mg | ORAL_TABLET | ORAL | Status: DC | PRN

## 2019-10-25 MED ORDER — CEFAZOLIN SODIUM-DEXTROSE 2-4 GM/100ML-% IV SOLN: 2.0000 g | INTRAVENOUS | Status: DC

## 2019-10-25 MED ORDER — CHLORHEXIDINE GLUCONATE 4 % EX LIQD: 60.0000 mL | Freq: Once | CUTANEOUS | Status: DC

## 2019-10-25 MED ORDER — FENTANYL CITRATE (PF) 100 MCG/2ML IJ SOLN
INTRAMUSCULAR | Status: DC | PRN
  Administered 2019-10-25 (×2): 50 ug via INTRAVENOUS

## 2019-10-25 MED ORDER — LIDOCAINE HCL (PF) 0.5 % IJ SOLN
INTRAMUSCULAR | Status: DC | PRN
  Administered 2019-10-25: 40 mg via INTRAVENOUS

## 2019-10-25 MED ORDER — CEFAZOLIN SODIUM-DEXTROSE 2-4 GM/100ML-% IV SOLN
INTRAVENOUS | Status: AC
  Filled 2019-10-25: qty 100

## 2019-10-25 MED ORDER — FENTANYL CITRATE (PF) 100 MCG/2ML IJ SOLN
INTRAMUSCULAR | Status: AC
  Filled 2019-10-25: qty 2

## 2019-10-25 MED ORDER — ACETAMINOPHEN 160 MG/5ML PO SOLN: 325.0000 mg | ORAL | Status: DC | PRN

## 2019-10-25 MED ORDER — PROPOFOL 500 MG/50ML IV EMUL
INTRAVENOUS | Status: DC | PRN
  Administered 2019-10-25: 150 ug/kg/min via INTRAVENOUS

## 2019-10-25 SURGICAL SUPPLY — 33 items
APL PRP STRL LF DISP 70% ISPRP (MISCELLANEOUS) ×1
BLADE SURG 15 STRL LF DISP TIS (BLADE) ×1 IMPLANT
BLADE SURG 15 STRL SS (BLADE) ×2
BNDG CMPR 9X4 STRL LF SNTH (GAUZE/BANDAGES/DRESSINGS)
BNDG COHESIVE 2X5 TAN STRL LF (GAUZE/BANDAGES/DRESSINGS) ×2 IMPLANT
BNDG ESMARK 4X9 LF (GAUZE/BANDAGES/DRESSINGS) IMPLANT
CHLORAPREP W/TINT 26 (MISCELLANEOUS) ×2 IMPLANT
CORD BIPOLAR FORCEPS 12FT (ELECTRODE) IMPLANT
COVER BACK TABLE REUSABLE LG (DRAPES) ×2 IMPLANT
COVER MAYO STAND REUSABLE (DRAPES) ×2 IMPLANT
COVER WAND RF STERILE (DRAPES) IMPLANT
CUFF TOURN SGL QUICK 18X4 (TOURNIQUET CUFF) IMPLANT
DECANTER SPIKE VIAL GLASS SM (MISCELLANEOUS) IMPLANT
DRAPE EXTREMITY T 121X128X90 (DISPOSABLE) ×2 IMPLANT
DRAPE SURG 17X23 STRL (DRAPES) ×2 IMPLANT
GAUZE SPONGE 4X4 12PLY STRL (GAUZE/BANDAGES/DRESSINGS) ×2 IMPLANT
GAUZE XEROFORM 1X8 LF (GAUZE/BANDAGES/DRESSINGS) ×2 IMPLANT
GLOVE BIOGEL PI IND STRL 8.5 (GLOVE) ×1 IMPLANT
GLOVE BIOGEL PI INDICATOR 8.5 (GLOVE) ×1
GLOVE SURG ORTHO 8.0 STRL STRW (GLOVE) ×2 IMPLANT
GOWN STRL REUS W/ TWL LRG LVL3 (GOWN DISPOSABLE) ×1 IMPLANT
GOWN STRL REUS W/TWL LRG LVL3 (GOWN DISPOSABLE) ×2
GOWN STRL REUS W/TWL XL LVL3 (GOWN DISPOSABLE) ×2 IMPLANT
NDL PRECISIONGLIDE 27X1.5 (NEEDLE) ×1 IMPLANT
NEEDLE PRECISIONGLIDE 27X1.5 (NEEDLE) ×2 IMPLANT
NS IRRIG 1000ML POUR BTL (IV SOLUTION) ×2 IMPLANT
PACK BASIN DAY SURGERY FS (CUSTOM PROCEDURE TRAY) ×2 IMPLANT
STOCKINETTE 4X48 STRL (DRAPES) ×2 IMPLANT
SUT ETHILON 4 0 PS 2 18 (SUTURE) ×2 IMPLANT
SYR BULB 3OZ (MISCELLANEOUS) ×2 IMPLANT
SYR CONTROL 10ML LL (SYRINGE) ×2 IMPLANT
TOWEL GREEN STERILE FF (TOWEL DISPOSABLE) ×4 IMPLANT
UNDERPAD 30X36 HEAVY ABSORB (UNDERPADS AND DIAPERS) ×2 IMPLANT

## 2019-10-25 NOTE — Anesthesia Procedure Notes (Signed)
Anesthesia Regional Block: Bier block (IV Regional)   Pre-Anesthetic Checklist: ,, timeout performed, Correct Patient, Correct Site, Correct Laterality, Correct Procedure, Correct Position, site marked, Risks and benefits discussed, Surgical consent,  Pre-op evaluation,  At surgeon's request  Laterality: Right  Prep: alcohol swabs        Procedures:,,,,,,,, #20gu IV placed  Narrative:   Performed by: With CRNAs  CRNA: Verita Lamb, CRNA
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
## 2019-10-25 NOTE — Op Note (Signed)
NAME: Jose [name] MEDICAL RECORD NO: [id number] DATE OF BIRTH: 1975-11-20 FACILITY: [facility] LOCATION: [facility] PHYSICIAN: Wynonia Sours, MD   OPERATIVE REPORT   DATE OF PROCEDURE: 10/25/19    PREOPERATIVE DIAGNOSIS:   Stenosing tenosynovitis right index finger   POSTOPERATIVE DIAGNOSIS:   Same   PROCEDURE:  Release A1 pulley right index finger   SURGEON: Daryll Brod, M.D.   ASSISTANT: none   ANESTHESIA:  Bier block with sedation   INTRAVENOUS FLUIDS:  Per anesthesia flow sheet.   ESTIMATED BLOOD LOSS:  Minimal.   COMPLICATIONS:  None.   SPECIMENS:  none   TOURNIQUET TIME:    Total Tourniquet Time Documented: Forearm (Right) - 19 minutes Total: Forearm (Right) - 19 minutes    DISPOSITION:  Stable to PACU.   INDICATIONS: Patient is a 43 year old male with a history of triggering of his right index finger.  This has not responded to conservative treatment.  Preperi-and postoperative course been discussed along with risks and complications.  He is aware that there is no guarantee to the surgery the possibility of infection recurrence injury to arteries nerves tendons complete relief symptoms dystrophy.  Preoperative area the patient is seen the extremity marked by both patient and surgeon antibiotic given  OPERATIVE COURSE: Patient is brought to the operating room where he placed in the supine position prepped and draped with ChloraPrep after a forearm IV regional anesthetic was performed by the anesthesia department.  A 3-minute dry time was allowed and timeout taken to confirm patient procedure.  An oblique incision was made over the A1 pulley of the right index finger carried down through subcutaneous tissue.  Neurovascular structures were identified protected with retractors.  A markedly thickened A1 pulley was identified this was released on its radial aspect.  A small incision was made centrally and A2.  Tenosynovial tissue proximally was separated  with blunt dissection the 2 tendons were then separated breaking any adhesions between the 2 tendons.  The finger was placed through a full passive range of motion no further trigger was noted.  The wound was copiously irrigated with saline.  The skin was closed with interrupted 4-0 nylon sutures.  A sterile compressive dressing was applied.  Deflation of the tourniquet all fingers immediately pink.  He was taken to the recovery room for observation in satisfactory condition.  He will be discharged home to return [facility] in 1 week on Tylenol ibuprofen for pain with Ultram for breakthrough.   Daryll Brod, MD Electronically signed, 10/25/19

## 2019-10-25 NOTE — Anesthesia Postprocedure Evaluation (Signed)
Anesthesia Post Note  Patient: Jose [name]  Procedure(s) Performed: RIGHT INDEX FINGER RELEASE TRIGGER FINGER/A-1 PULLEY (Right Hand)     Patient location during evaluation: PACU Anesthesia Type: MAC Level of consciousness: awake and alert Pain management: pain level controlled Vital Signs Assessment: post-procedure vital signs reviewed and stable Respiratory status: spontaneous breathing, nonlabored ventilation, respiratory function stable and patient connected to nasal cannula oxygen Cardiovascular status: stable and blood pressure returned to baseline Postop Assessment: no apparent nausea or vomiting Anesthetic complications: no    Last Vitals:  Vitals:   10/25/19 0909 10/25/19 0910  BP: (!) 95/59 (!) 99/59  Pulse:    Resp: (!) 24 (!) 24  Temp:    SpO2:      Last Pain:  Vitals:   10/25/19 0806  TempSrc: Temporal  PainSc: 0-No pain                 Keldrick Pomplun

## 2019-10-25 NOTE — Discharge Instructions (Addendum)

## 2019-10-25 NOTE — Brief Op Note (Signed)
10/25/2019  9:10 AM  PATIENT:  [name]  43 y.o. male  PRE-OPERATIVE DIAGNOSIS:  trigger right index finger  POST-OPERATIVE DIAGNOSIS:  trigger right index finger  PROCEDURE:  Procedure(s) with comments: RIGHT INDEX FINGER RELEASE TRIGGER FINGER/A-1 PULLEY (Right) - IV REGIONAL FOREARM BLOCK  SURGEON:  Surgeon(s) and Role:    * Daryll Brod, MD - Primary  PHYSICIAN ASSISTANT:   ASSISTANTS: none   ANESTHESIA:   regional and IV sedation  EBL:  14ml   BLOOD ADMINISTERED:none  DRAINS: none   LOCAL MEDICATIONS USED:  NONE  SPECIMEN:  No Specimen  DISPOSITION OF SPECIMEN:  N/A  COUNTS:  YES  TOURNIQUET:   Total Tourniquet Time Documented: Forearm (Right) - 19 minutes Total: Forearm (Right) - 19 minutes   DICTATION: .Viviann Spare Dictation  PLAN OF CARE: Discharge to home after PACU  PATIENT DISPOSITION:  PACU - hemodynamically stable.

## 2019-10-25 NOTE — Transfer of Care (Signed)
Immediate Anesthesia Transfer of Care Note  Patient: Jose [name]  Procedure(s) Performed: RIGHT INDEX FINGER RELEASE TRIGGER FINGER/A-1 PULLEY (Right Hand)  Patient Location: PACU  Anesthesia Type: mac, bier block  Level of Consciousness: awake, alert  and oriented  Airway & Oxygen Therapy: Patient Spontanous Breathing and Patient connected to face mask oxygen  Post-op Assessment: Report given to RN and Post -op Vital signs reviewed and stable  Post vital signs: Reviewed and stable  Last Vitals:  Vitals Value Taken Time  BP    Temp    Pulse    Resp 30 10/25/19 0908  SpO2    Vitals shown include unvalidated device data.  Last Pain:  Vitals:   10/25/19 0806  TempSrc: Temporal  PainSc: 0-No pain         Complications: No apparent anesthesia complications

## 2019-10-30 ENCOUNTER — Encounter: Payer: Self-pay | Admitting: *Deleted

## 2019-11-14 ENCOUNTER — Ambulatory Visit (INDEPENDENT_AMBULATORY_CARE_PROVIDER_SITE_OTHER): Payer: 59 | Admitting: Psychiatry

## 2019-11-14 ENCOUNTER — Other Ambulatory Visit: Payer: Self-pay

## 2019-11-14 DIAGNOSIS — F909 Attention-deficit hyperactivity disorder, unspecified type: Secondary | ICD-10-CM | POA: Diagnosis not present

## 2019-11-14 DIAGNOSIS — F411 Generalized anxiety disorder: Secondary | ICD-10-CM

## 2019-11-14 MED ORDER — AMPHETAMINE-DEXTROAMPHETAMINE 20 MG PO TABS: 20.0000 mg | ORAL_TABLET | Freq: Two times a day (BID) | ORAL | 0 refills | Status: DC

## 2019-11-14 MED ORDER — TRAZODONE HCL 100 MG PO TABS: 100.0000 mg | ORAL_TABLET | Freq: Every evening | ORAL | 0 refills | Status: DC | PRN

## 2019-11-14 NOTE — Progress Notes (Signed)
BH MD/PA/NP OP Progress Note  11/14/2019 3:10 PM Jose [name]  MRN:  [id number] Interview was conducted by phone and I verified that I was speaking with the correct person using two identifiers. I discussed the limitations of evaluation and management by telemedicine and  the availability of in person appointments. Patient expressed understanding and agreed to proceed.  Chief Complaint: "All things are well".  HPI: 44 yo male with a hx of anxiety and some depression who has been troughout his life struggling with problems focusing, loosing interest, task completion as well as excessive worrying and occasional panic attacks and depression secondary to his focusing problems. These have impinged on his ability to function effectively at work (but also at home) and in the past he has lost two jobs because of performance deficiencies (struggled with writing proposals). 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 He has no component of hyperactivity. Jose [name] reports in addition to ADD symptoms also initial and middle insomnia, racing thoughts, excessive worrying and occasional panic attacks. He denies feeling hopeless, helpless, suicidal. His appetite is normal.We continued duloxetine, added trazodone for insomnia which appears to work well and Adderall 20 mg bid. He admits to some anxiety driven by general (pandemia) and personal circumstances. He and his significant other moved to a new house in early December and will get married on February 14.    Visit Diagnosis:    ICD-10-CM   1. GAD (generalized anxiety disorder)  F41.1   2. Adult ADHD  F90.9     Past Psychiatric History: Please see intake H&P.  Past Medical History:  Past Medical History:  Diagnosis Date  . GAD (generalized anxiety disorder)   . Hyperlipidemia   . Macular degeneration, bilateral   .  Retinopathy   . Type II diabetes mellitus with complication, uncontrolled (HCC)    retinopathy, neuropathy, microalbuminuria    Past Surgical History:  Procedure Laterality Date  . APPENDECTOMY    . HERNIA REPAIR    . TRIGGER FINGER RELEASE Right 10/25/2019   Procedure: RIGHT INDEX FINGER RELEASE TRIGGER FINGER/A-1 PULLEY;  Surgeon: Daryll Brod, MD;  Location: [facility];  Service: Orthopedics;  Laterality: Right;  IV REGIONAL FOREARM BLOCK    Family Psychiatric History: Reviewed.  Family History:  Family History  Problem Relation Age of Onset  . Diabetes Mother   . Hyperlipidemia Mother   . Stroke Mother   . Diabetes Father   . Hyperlipidemia Brother   . Stroke Brother   . ADD / ADHD Brother   . ADD / ADHD Son     Social History:  Social History   Socioeconomic History  . Marital status: Significant Other    Spouse name: Not on file  . Number of children: 2  . Years of education: Not on file  . Highest education level: Bachelor's degree (e.g., BA, AB, BS)  Occupational History  . Occupation: Multimedia programmer: [employer]  Tobacco Use  . Smoking status: Current Every Day Smoker    Packs/day: 1.00    Years: 20.00    Pack years: 20.00    Types: Cigarettes  . Smokeless tobacco: Never Used  Substance and Sexual Activity  . Alcohol use: Not Currently  . Drug use: Never  . Sexual activity: Yes    Partners: Female    Birth control/protection: None  Comment: with monogamous partner  Other Topics Concern  . Not on file  Social History Narrative   Pt has lived majority of life in [place]. Lives at home with partner, 2 kids, 8 cats, and 1 dog.    Social Determinants of Health   Financial Resource Strain:   . Difficulty of Paying Living Expenses: Not on file  Food Insecurity:   . Worried About Charity fundraiser in the Last Year: Not on file  . Ran Out of Food in the Last Year: Not on file  Transportation Needs:   .  Lack of Transportation (Medical): Not on file  . Lack of Transportation (Non-Medical): Not on file  Physical Activity:   . Days of Exercise per Week: Not on file  . Minutes of Exercise per Session: Not on file  Stress:   . Feeling of Stress : Not on file  Social Connections:   . Frequency of Communication with Friends and Family: Not on file  . Frequency of Social Gatherings with Friends and Family: Not on file  . Attends Religious Services: Not on file  . Active Member of Clubs or Organizations: Not on file  . Attends Archivist Meetings: Not on file  . Marital Status: Not on file    Allergies: No Known Allergies  Metabolic Disorder Labs: Lab Results  Component Value Date   HGBA1C 9.2 (H) 07/20/2019   No results found for: PROLACTIN Lab Results  Component Value Date   CHOL 110 07/20/2019   TRIG 155 (H) 07/20/2019   HDL 38 (L) 07/20/2019   CHOLHDL 2.9 07/20/2019   LDLCALC 46 07/20/2019   LDLCALC 40 12/12/2018   Lab Results  Component Value Date   TSH 3.070 03/15/2019   TSH 1.990 12/12/2018    Therapeutic Level Labs: No results found for: LITHIUM No results found for: VALPROATE No components found for:  CBMZ  Current Medications: Current Outpatient Medications  Medication Sig Dispense Refill  . [START ON 11/21/2019] amphetamine-dextroamphetamine (ADDERALL) 20 MG tablet Take 1 tablet (20 mg total) by mouth 2 (two) times daily with breakfast and lunch. 60 tablet 0  . atorvastatin (LIPITOR) 40 MG tablet Take 1 tablet (40 mg total) by mouth daily. 90 tablet 3  . canagliflozin (INVOKANA) 300 MG TABS tablet Take 1 tablet (300 mg total) by mouth daily before breakfast. 90 tablet 0  . DULoxetine (CYMBALTA) 60 MG capsule Take 1 capsule (60 mg total) by mouth daily. 90 capsule 1  . gabapentin (NEURONTIN) 300 MG capsule TAKE 1 TO 2 CAPSULES(300 TO 600 MG) BY MOUTH AT BEDTIME 180 capsule 1  . glimepiride (AMARYL) 4 MG tablet Take 1 tablet (4 mg total) by mouth daily  before breakfast. 90 tablet 0  . losartan (COZAAR) 50 MG tablet TAKE 1 TABLET(50 MG) BY MOUTH DAILY 90 tablet 1  . metFORMIN (GLUCOPHAGE-XR) 500 MG 24 hr tablet TAKE 4 TABLETS(2000 MG) BY MOUTH DAILY WITH BREAKFAST 368 tablet 0  . Ranibizumab (LUCENTIS) 0.3 MG/0.05ML SOLN by Intravitreal route.     . tadalafil (CIALIS) 20 MG tablet TK 1 T PO  PO QD PRN FOR ERECTILE DYSFUNCTION 10 tablet 11  . tobramycin (TOBREX) 0.3 % ophthalmic solution INSTILL 1 DROP IN BOTH EYES 4 TIMES A DAY BEGIN 1 DAY  PRIOR TO TREATMENT CONTINUE THE DAY OF TREATMENT AND ONE FULL DAY AFTER TREATMENT  5  . traMADol (ULTRAM) 50 MG tablet Take 1 tablet (50 mg total) by mouth every 6 (six) hours as  needed. 20 tablet 0  . traZODone (DESYREL) 100 MG tablet Take 1 tablet (100 mg total) by mouth at bedtime as needed for sleep. 90 tablet 0  . TRULICITY 1.5 0000000 SOPN INJECT 1 DOSE INTO THE SKIN ONCE WEEKLY 6 mL 1   No current facility-administered medications for this visit.     Psychiatric Specialty Exam: Review of Systems  All other systems reviewed and are negative.   There were no vitals taken for this visit.There is no height or weight on file to calculate BMI.  General Appearance: NA  Eye Contact:  NA  Speech:  Clear and Coherent and Normal Rate  Volume:  Normal  Mood:  Euthymic  Affect:  NA  Thought Process:  Goal Directed and Linear  Orientation:  Full (Time, Place, and Person)  Thought Content: Logical   Suicidal Thoughts:  No  Homicidal Thoughts:  No  Memory:  Immediate;   Good Recent;   Good Remote;   Good  Judgement:  Good  Insight:  Good  Psychomotor Activity:  NA  Concentration:  Concentration: Good  Recall:  Good  Fund of Knowledge: Good  Language: Good  Akathisia:  Negative  Handed:  Right  AIMS (if indicated): not done  Assets:  Communication Skills Desire for Improvement Financial Resources/Insurance Housing Intimacy Social Support Talents/Skills  ADL's:  Intact  Cognition: WNL   Sleep:  Fair   Screenings: GAD-7     Office Visit from 05/10/2019 in Primary Care at [facility] from 04/12/2019 in Primary Care at [facility]  Total GAD-7 Score  11  19    PHQ2-9     Office Visit from 07/20/2019 in Primary Care at [facility] from 05/14/2019 in Primary Care at [facility] from 05/10/2019 in Primary Care at [facility] from 04/12/2019 in [facility] from 09/12/2018 in Primary Care at [facility]  PHQ-2 Total Score  0  0  0  2  0  PHQ-9 Total Score  --  --  --  11  --       Assessment and Plan: 44 yo male with a hx of anxiety and some depression who has been troughout his life struggling with problems focusing, loosing interest, task completion as well as excessive worrying and occasional panic attacks and depression secondary to his focusing problems. These have impinged on his ability to function effectively at work (but also at home) and in the past he has lost two jobs because of performance deficiencies (struggled with writing proposals). 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 He has no component of hyperactivity. Jose [name] reports in addition to ADD symptoms also initial and middle insomnia, racing thoughts, excessive worrying and occasional panic attacks. He denies feeling hopeless, helpless, suicidal. His appetite is normal.We continued duloxetine, added trazodone for insomnia which appears to work well and Adderall 20 mg bid. He admits to some anxiety driven by general (pandemia) and personal circumstances. He and his significant other moved to a new house in early December and will get married on February 14.   Dx: GAD, ADHD adult  Plan: Continue duloxetine 60 mg,trazodone 100 mg prn sleepand Adderall20mg  bid for ADD.Next appointment in three months.The plan was discussed with patient who  had an opportunity to ask questions and these were all answered. I spend25 minutes inphone consultationwith the patient.  Stephanie Acre, MD 11/14/2019, 3:10 PM

## 2019-11-22 ENCOUNTER — Encounter: Payer: Self-pay | Admitting: Family Medicine

## 2019-11-22 ENCOUNTER — Other Ambulatory Visit: Payer: Self-pay

## 2019-11-22 ENCOUNTER — Ambulatory Visit (INDEPENDENT_AMBULATORY_CARE_PROVIDER_SITE_OTHER): Payer: 59 | Admitting: Family Medicine

## 2019-11-22 VITALS — BP 118/84 | HR 98 | Temp 98.2°F | Ht 70.0 in | Wt 194.0 lb

## 2019-11-22 DIAGNOSIS — F17200 Nicotine dependence, unspecified, uncomplicated: Secondary | ICD-10-CM | POA: Diagnosis not present

## 2019-11-22 DIAGNOSIS — E875 Hyperkalemia: Secondary | ICD-10-CM

## 2019-11-22 DIAGNOSIS — M5412 Radiculopathy, cervical region: Secondary | ICD-10-CM

## 2019-11-22 DIAGNOSIS — E1142 Type 2 diabetes mellitus with diabetic polyneuropathy: Secondary | ICD-10-CM

## 2019-11-22 DIAGNOSIS — E785 Hyperlipidemia, unspecified: Secondary | ICD-10-CM

## 2019-11-22 MED ORDER — TRULICITY 1.5 MG/0.5ML ~~LOC~~ SOAJ: SUBCUTANEOUS | 1 refills | Status: DC

## 2019-11-22 MED ORDER — VARENICLINE TARTRATE 0.5 MG X 11 & 1 MG X 42 PO MISC: ORAL | 0 refills | Status: DC

## 2019-11-22 MED ORDER — VARENICLINE TARTRATE 1 MG PO TABS: 1.0000 mg | ORAL_TABLET | Freq: Two times a day (BID) | ORAL | 2 refills | Status: DC

## 2019-11-22 NOTE — Patient Instructions (Signed)
° ° ° °  If you have lab work done today you will be contacted with your lab results within the next 2 weeks.  If you have not heard from us then please contact us. The fastest way to get your results is to register for My Chart. ° ° °IF you received an x-ray today, you will receive an invoice from [facility]. Please contact [facility] at [phone] with questions or concerns regarding your invoice.  ° °IF you received labwork today, you will receive an invoice from [facility]. Please contact [facility] at [phone] with questions or concerns regarding your invoice.  ° °Our billing staff will not be able to assist you with questions regarding bills from these companies. ° °You will be contacted with the lab results as soon as they are available. The fastest way to get your results is to activate your My Chart account. Instructions are located on the last page of this paperwork. If you have not heard from us regarding the results in 2 weeks, please contact this office. °  ° ° ° °
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
## 2019-11-23 ENCOUNTER — Telehealth: Payer: Self-pay | Admitting: Family Medicine

## 2019-11-23 LAB — CMP14+EGFR
ALT: 23 IU/L (ref 0–44)
AST: 19 IU/L (ref 0–40)
Albumin/Globulin Ratio: 2.1 (ref 1.2–2.2)
Albumin: 4.4 g/dL (ref 4.0–5.0)
Alkaline Phosphatase: 116 IU/L (ref 39–117)
BUN/Creatinine Ratio: 16 (ref 9–20)
BUN: 20 mg/dL (ref 6–24)
Bilirubin Total: 0.3 mg/dL (ref 0.0–1.2)
CO2: 20 mmol/L (ref 20–29)
Calcium: 9.5 mg/dL (ref 8.7–10.2)
Chloride: 106 mmol/L (ref 96–106)
Creatinine, Ser: 1.22 mg/dL (ref 0.76–1.27)
GFR calc Af Amer: 82 mL/min/{1.73_m2} (ref >59)
GFR calc non Af Amer: 71 mL/min/{1.73_m2} (ref >59)
Globulin, Total: 2.1 g/dL (ref 1.5–4.5)
Glucose: 97 mg/dL (ref 65–99)
Potassium: 5.5 mmol/L — ABNORMAL HIGH (ref 3.5–5.2)
Sodium: 141 mmol/L (ref 134–144)
Total Protein: 6.5 g/dL (ref 6.0–8.5)

## 2019-11-23 LAB — LIPID PANEL
Chol/HDL Ratio: 2.8 ratio (ref 0.0–5.0)
Cholesterol, Total: 116 mg/dL (ref 100–199)
HDL: 42 mg/dL (ref >39)
LDL Chol Calc (NIH): 57 mg/dL (ref 0–99)
Triglycerides: 85 mg/dL (ref 0–149)
VLDL Cholesterol Cal: 17 mg/dL (ref 5–40)

## 2019-11-23 LAB — HEMOGLOBIN A1C
Est. average glucose Bld gHb Est-mCnc: 160 mg/dL
Hgb A1c MFr Bld: 7.2 % — ABNORMAL HIGH (ref 4.8–5.6)

## 2019-11-23 NOTE — Telephone Encounter (Signed)
PA request for trulicity processed KEY [id number]

## 2019-11-23 NOTE — Telephone Encounter (Signed)
PA approved.

## 2019-11-30 NOTE — Telephone Encounter (Signed)
Pt calling to check on this.  States pharmacy is telling him this is going to be over $2,000 per month. Pt can be reached at [phone].

## 2019-12-01 ENCOUNTER — Other Ambulatory Visit: Payer: Self-pay | Admitting: Family Medicine

## 2019-12-03 NOTE — Addendum Note (Signed)
Addended by: [place] Guys on: 12/03/2019 09:31 AM   Modules accepted: Orders

## 2019-12-12 ENCOUNTER — Ambulatory Visit: Payer: 59 | Admitting: Podiatry

## 2019-12-12 DIAGNOSIS — R200 Anesthesia of skin: Secondary | ICD-10-CM | POA: Diagnosis present

## 2019-12-17 ENCOUNTER — Other Ambulatory Visit: Payer: Self-pay | Admitting: Family Medicine

## 2019-12-17 NOTE — Telephone Encounter (Signed)
Requested Prescriptions  Pending Prescriptions Disp Refills  . glimepiride (AMARYL) 4 MG tablet [Pharmacy Med Name: GLIMEPIRIDE 4MG  TABLETS] 90 tablet 0    Sig: TAKE 1 TABLET BY MOUTH DAILY BEFORE BREAKFAST.     Endocrinology:  Diabetes - Sulfonylureas Passed - 12/17/2019  2:55 PM      Passed - HBA1C is between 0 and 7.9 and within 180 days    Hgb A1c MFr Bld  Date Value Ref Range Status  11/22/2019 7.2 (H) 4.8 - 5.6 % Final    Comment:             Prediabetes: 5.7 - 6.4          Diabetes: >6.4          Glycemic control for adults with diabetes: <7.0          Passed - Valid encounter within last 6 months    Recent Outpatient Visits          3 weeks ago Hyperlipidemia, unspecified hyperlipidemia type   Primary Care at [facility], Lilia Argue, MD   5 months ago Type 2 diabetes mellitus with diabetic polyneuropathy, without long-term current use of insulin [facility])   [facility] at [facility], Lilia Argue, MD   7 months ago Lumbar contusion, initial encounter   Primary Care at [facility], Ines Bloomer, MD   7 months ago GAD (generalized anxiety disorder)   [facility] at [facility], Lilia Argue, MD   8 months ago GAD (generalized anxiety disorder)   [facility] at [facility], Lilia Argue, MD      Future Appointments            In 2 months [place] Guys, MD Primary Care at [place], [facility]

## 2019-12-24 ENCOUNTER — Telehealth (HOSPITAL_COMMUNITY): Payer: Self-pay | Admitting: *Deleted

## 2019-12-24 ENCOUNTER — Other Ambulatory Visit (HOSPITAL_COMMUNITY): Payer: Self-pay | Admitting: Psychiatry

## 2019-12-24 MED ORDER — AMPHETAMINE-DEXTROAMPHETAMINE 20 MG PO TABS: 20.0000 mg | ORAL_TABLET | Freq: Two times a day (BID) | ORAL | 0 refills | Status: DC

## 2019-12-24 NOTE — Telephone Encounter (Signed)
Pt called requesting a refill on adhd medication, Adderall 20mg . Pt has an upcoming appointment on 02/22/20. Please review.

## 2019-12-24 NOTE — Telephone Encounter (Signed)
Done

## 2019-12-28 ENCOUNTER — Ambulatory Visit: Payer: 59 | Admitting: Podiatry

## 2020-01-07 ENCOUNTER — Telehealth: Payer: Self-pay | Admitting: Family Medicine

## 2020-01-07 NOTE — Telephone Encounter (Signed)
Pt called regarding the medication for trulicity. Talked to Almyra Free she is talking to insurance about getting a different type the medication for this insurance to cover.Pt has been with out medication for 6 weeks. When it is resolved this is the correct pharmacy to send it too.   [place], [place] AT [place]

## 2020-01-07 NOTE — Telephone Encounter (Signed)
Pt cannot afford medication even after the PA approval. $2000 out of pocket.    Please advise on what the next step is

## 2020-01-08 MED ORDER — BYDUREON BCISE 2 MG/0.85ML ~~LOC~~ AUIJ: 2.0000 mg | AUTO-INJECTOR | SUBCUTANEOUS | 5 refills | Status: DC

## 2020-01-08 NOTE — Addendum Note (Signed)
Addended by: [place] Guys on: 01/08/2020 12:06 PM   Modules accepted: Orders

## 2020-01-08 NOTE — Telephone Encounter (Signed)
Jose [name] has you found out anything. Per formulary on epic bydureon b-cise should be covered. Sent rx.  Please let patient know that I sent rx for a med similar to trulicity. thanks

## 2020-01-10 ENCOUNTER — Encounter: Payer: Self-pay | Admitting: *Deleted

## 2020-01-10 ENCOUNTER — Telehealth: Payer: Self-pay | Admitting: *Deleted

## 2020-01-10 NOTE — Telephone Encounter (Signed)
[id number]  KEY INVOKANA

## 2020-01-18 ENCOUNTER — Other Ambulatory Visit: Payer: Self-pay

## 2020-01-18 DIAGNOSIS — E1142 Type 2 diabetes mellitus with diabetic polyneuropathy: Secondary | ICD-10-CM

## 2020-01-21 ENCOUNTER — Other Ambulatory Visit: Payer: Self-pay

## 2020-01-21 ENCOUNTER — Other Ambulatory Visit (HOSPITAL_COMMUNITY): Payer: Self-pay | Admitting: Psychiatry

## 2020-01-21 ENCOUNTER — Telehealth: Payer: Self-pay | Admitting: Family Medicine

## 2020-01-21 ENCOUNTER — Telehealth (HOSPITAL_COMMUNITY): Payer: Self-pay | Admitting: *Deleted

## 2020-01-21 DIAGNOSIS — E1142 Type 2 diabetes mellitus with diabetic polyneuropathy: Secondary | ICD-10-CM

## 2020-01-21 MED ORDER — FARXIGA 10 MG PO TABS: 10.0000 mg | ORAL_TABLET | Freq: Every day | ORAL | 1 refills | Status: DC

## 2020-01-21 MED ORDER — AMPHETAMINE-DEXTROAMPHETAMINE 20 MG PO TABS: 20.0000 mg | ORAL_TABLET | Freq: Two times a day (BID) | ORAL | 0 refills | Status: DC

## 2020-01-21 NOTE — Telephone Encounter (Signed)
Done

## 2020-01-21 NOTE — Telephone Encounter (Signed)
Pt called requesting refill on the Adderall 20mg  written 12/24/19 thru 01/23/20. Pt has an upcoming appointment on 02/22/20. Pt uses [facility] on [address]. Please review.

## 2020-01-21 NOTE — Telephone Encounter (Signed)
Pharmacy needs a 90 day supply Rx For canagliflozin (INVOKANA) 300 MG TABS tablet  Sent to [place], [place] AT [facility] [place] Phone:  [phone]  Fax:  [phone]      Pt states that the Rx for 30 days for Exenatide ER (BYDUREON BCISE) 2 MG/0.85ML AUIJ  Is $750 and 90 day supply was over $2100 / Pt would like something with better cost options / please advise

## 2020-01-23 DIAGNOSIS — G5603 Carpal tunnel syndrome, bilateral upper limbs: Secondary | ICD-10-CM | POA: Insufficient documentation

## 2020-01-24 ENCOUNTER — Other Ambulatory Visit: Payer: Self-pay | Admitting: Orthopedic Surgery

## 2020-01-30 ENCOUNTER — Other Ambulatory Visit: Payer: Self-pay | Admitting: Family Medicine

## 2020-01-30 DIAGNOSIS — E119 Type 2 diabetes mellitus without complications: Secondary | ICD-10-CM

## 2020-01-30 MED ORDER — METFORMIN HCL ER 500 MG PO TB24: ORAL_TABLET | ORAL | 0 refills | Status: DC

## 2020-01-30 NOTE — Telephone Encounter (Signed)
Copied from [place] [id number]. Topic: Quick Communication - Rx Refill/Question >> Jan 30, 2020 12:57 PM Leward Quan A wrote: Medication: metFORMIN (GLUCOPHAGE-XR) 500 MG 24 hr tablet  Requesting Rx for 90 day supply sent to pharmacy 4th request. Please advise   Has the patient contacted their pharmacy? Yes.   (Agent: If no, request that the patient contact the pharmacy for the refill.) (Agent: If yes, when and what did the pharmacy advise?)  Preferred Pharmacy (with phone number or street name): [facility] [place], [place] - [address]  Phone:  [phone] Fax:  [phone]     Agent: Please be advised that RX refills may take up to 3 business days. We ask that you follow-up with your pharmacy.

## 2020-02-20 ENCOUNTER — Other Ambulatory Visit (HOSPITAL_COMMUNITY): Payer: Self-pay | Admitting: Psychiatry

## 2020-02-20 ENCOUNTER — Telehealth (HOSPITAL_COMMUNITY): Payer: Self-pay | Admitting: *Deleted

## 2020-02-20 MED ORDER — AMPHETAMINE-DEXTROAMPHETAMINE 20 MG PO TABS: 20.0000 mg | ORAL_TABLET | Freq: Two times a day (BID) | ORAL | 0 refills | Status: DC

## 2020-02-20 NOTE — Telephone Encounter (Signed)
Pt called requesting refill of the Adderall 20mg  bid. Last ordered on 01/21/20. Pt has an upcoming appointment on 02/26/20. Please review.

## 2020-02-20 NOTE — Telephone Encounter (Signed)
Done

## 2020-02-21 ENCOUNTER — Ambulatory Visit: Payer: 59 | Admitting: Family Medicine

## 2020-02-22 ENCOUNTER — Ambulatory Visit (HOSPITAL_COMMUNITY): Payer: 59 | Admitting: Psychiatry

## 2020-02-26 ENCOUNTER — Telehealth (INDEPENDENT_AMBULATORY_CARE_PROVIDER_SITE_OTHER): Payer: 59 | Admitting: Psychiatry

## 2020-02-26 ENCOUNTER — Other Ambulatory Visit: Payer: Self-pay

## 2020-02-26 DIAGNOSIS — F909 Attention-deficit hyperactivity disorder, unspecified type: Secondary | ICD-10-CM

## 2020-02-26 DIAGNOSIS — F411 Generalized anxiety disorder: Secondary | ICD-10-CM | POA: Diagnosis not present

## 2020-02-26 MED ORDER — TRAZODONE HCL 100 MG PO TABS: 100.0000 mg | ORAL_TABLET | Freq: Every evening | ORAL | 0 refills | Status: DC | PRN

## 2020-02-26 MED ORDER — AMPHETAMINE-DEXTROAMPHETAMINE 20 MG PO TABS: 20.0000 mg | ORAL_TABLET | Freq: Every day | ORAL | 0 refills | Status: DC

## 2020-02-26 MED ORDER — DULOXETINE HCL 60 MG PO CPEP: 60.0000 mg | ORAL_CAPSULE | Freq: Every day | ORAL | 1 refills | Status: DC

## 2020-02-26 NOTE — Progress Notes (Signed)
[place] MD/PA/NP OP Progress Note  02/26/2020 1:43 PM Jose [name]  MRN:  [id number] Interview was conducted by phone and I verified that I was speaking with the correct person using two identifiers. I discussed the limitations of evaluation and management by telemedicine and  the availability of in person appointments. Patient expressed understanding and agreed to proceed.  Chief Complaint: "I have been doing well".  HPI: 44 yo married male with a hx of anxiety and some depression who has been troughout his life struggling with problems focusing, loosing interest, task completion as well as excessive worrying and occasional panic attacks and depression secondary to his focusing problems. These have impinged on his ability to function effectively at work (but also at home) and in the past he has lost two jobs because of performance deficiencies (struggled with writing proposals). 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 He has no component of hyperactivity. Labaron reports in addition to ADD symptoms also initial and middle insomnia, racing thoughts, excessive worrying and occasional panic attacks. He denies feeling hopeless, helpless, suicidal. His appetite is normal.We continued duloxetine, added trazodone for insomnia which appears to work well and Adderall20 mg bid. It works well and his concentration improved while anxiety decreased.Sleep is better as well and he does not need to use trazodone often. He moved to a new house in early December and got married (via [place]) on February 14.   Visit Diagnosis:    ICD-10-CM   1. GAD (generalized anxiety disorder)  F41.1   2. Adult ADHD  F90.9     Past Psychiatric History: Please see intake H&P.  Past Medical History:  Past Medical History:  Diagnosis Date  . GAD (generalized anxiety disorder)   . Hyperlipidemia   . Macular degeneration, bilateral   . Retinopathy   . Type  II diabetes mellitus with complication, uncontrolled (HCC)    retinopathy, neuropathy, microalbuminuria    Past Surgical History:  Procedure Laterality Date  . APPENDECTOMY    . HERNIA REPAIR    . TRIGGER FINGER RELEASE Right 10/25/2019   Procedure: RIGHT INDEX FINGER RELEASE TRIGGER FINGER/A-1 PULLEY;  Surgeon: Daryll Brod, MD;  Location: [facility];  Service: Orthopedics;  Laterality: Right;  IV REGIONAL FOREARM BLOCK    Family Psychiatric History: Reviewed.  Family History:  Family History  Problem Relation Age of Onset  . Diabetes Mother   . Hyperlipidemia Mother   . Stroke Mother   . Diabetes Father   . Hyperlipidemia Brother   . Stroke Brother   . ADD / ADHD Brother   . ADD / ADHD Son     Social History:  Social History   Socioeconomic History  . Marital status: Significant Other    Spouse name: Not on file  . Number of children: 2  . Years of education: Not on file  . Highest education level: Bachelor's degree (e.g., BA, AB, BS)  Occupational History  . Occupation: Multimedia programmer: [employer]  Tobacco Use  . Smoking status: Current Every Day Smoker    Packs/day: 1.00    Years: 20.00    Pack years: 20.00    Types: Cigarettes  . Smokeless tobacco: Never Used  Substance and Sexual Activity  . Alcohol use: Not Currently  . Drug use: Never  . Sexual activity: Yes    Partners: Female    Birth control/protection: None  Comment: with monogamous partner  Other Topics Concern  . Not on file  Social History Narrative   Pt has lived majority of life in [place]. Lives at home with partner, 2 kids, 8 cats, and 1 dog.    Social Determinants of Health   Financial Resource Strain:   . Difficulty of Paying Living Expenses:   Food Insecurity:   . Worried About Charity fundraiser in the Last Year:   . Arboriculturist in the Last Year:   Transportation Needs:   . Film/video editor (Medical):   Marland Kitchen Lack of  Transportation (Non-Medical):   Physical Activity:   . Days of Exercise per Week:   . Minutes of Exercise per Session:   Stress:   . Feeling of Stress :   Social Connections:   . Frequency of Communication with Friends and Family:   . Frequency of Social Gatherings with Friends and Family:   . Attends Religious Services:   . Active Member of Clubs or Organizations:   . Attends Archivist Meetings:   Marland Kitchen Marital Status:     Allergies: No Known Allergies  Metabolic Disorder Labs: Lab Results  Component Value Date   HGBA1C 7.2 (H) 11/22/2019   No results found for: PROLACTIN Lab Results  Component Value Date   CHOL 116 11/22/2019   TRIG 85 11/22/2019   HDL 42 11/22/2019   CHOLHDL 2.8 11/22/2019   LDLCALC 57 11/22/2019   LDLCALC 46 07/20/2019   Lab Results  Component Value Date   TSH 3.070 03/15/2019   TSH 1.990 12/12/2018    Therapeutic Level Labs: No results found for: LITHIUM No results found for: VALPROATE No components found for:  CBMZ  Current Medications: Current Outpatient Medications  Medication Sig Dispense Refill  . amphetamine-dextroamphetamine (ADDERALL) 20 MG tablet Take 1 tablet (20 mg total) by mouth 2 (two) times daily with breakfast and lunch. 60 tablet 0  . [START ON 03/21/2020] amphetamine-dextroamphetamine (ADDERALL) 20 MG tablet Take 1 tablet (20 mg total) by mouth daily before breakfast. 30 tablet 0  . [START ON 04/21/2020] amphetamine-dextroamphetamine (ADDERALL) 20 MG tablet Take 1 tablet (20 mg total) by mouth daily before breakfast. 30 tablet 0  . [START ON 05/21/2020] amphetamine-dextroamphetamine (ADDERALL) 20 MG tablet Take 1 tablet (20 mg total) by mouth daily before breakfast. 30 tablet 0  . atorvastatin (LIPITOR) 40 MG tablet Take 1 tablet (40 mg total) by mouth daily. 90 tablet 3  . dapagliflozin propanediol (FARXIGA) 10 MG TABS tablet Take 10 mg by mouth daily before breakfast. 30 tablet 1  . DULoxetine (CYMBALTA) 60 MG capsule  Take 1 capsule (60 mg total) by mouth daily. 90 capsule 1  . Exenatide ER (BYDUREON BCISE) 2 MG/0.85ML AUIJ Inject 2 mg into the skin once a week. 4 pen 5  . gabapentin (NEURONTIN) 300 MG capsule TAKE 1 TO 2 CAPSULES(300 TO 600 MG) BY MOUTH AT BEDTIME 180 capsule 1  . glimepiride (AMARYL) 4 MG tablet TAKE 1 TABLET BY MOUTH DAILY BEFORE BREAKFAST. 90 tablet 0  . losartan (COZAAR) 50 MG tablet TAKE 1 TABLET(50 MG) BY MOUTH DAILY 90 tablet 1  . metFORMIN (GLUCOPHAGE-XR) 500 MG 24 hr tablet Take 4 tablets daily before breakfast. 368 tablet 0  . Ranibizumab (LUCENTIS) 0.3 MG/0.05ML SOLN by Intravitreal route.     . tadalafil (CIALIS) 20 MG tablet TK 1 T PO  PO QD PRN FOR ERECTILE DYSFUNCTION 10 tablet 11  . tobramycin (TOBREX) 0.3 %  ophthalmic solution INSTILL 1 DROP IN BOTH EYES 4 TIMES A DAY BEGIN 1 DAY  PRIOR TO TREATMENT CONTINUE THE DAY OF TREATMENT AND ONE FULL DAY AFTER TREATMENT  5  . traMADol (ULTRAM) 50 MG tablet Take 1 tablet (50 mg total) by mouth every 6 (six) hours as needed. 20 tablet 0  . traZODone (DESYREL) 100 MG tablet Take 1 tablet (100 mg total) by mouth at bedtime as needed for sleep. 90 tablet 0  . varenicline (CHANTIX PAK) 0.5 MG X 11 & 1 MG X 42 tablet Take one 0.5 mg tablet by mouth once daily for 3 days, then increase to one 0.5 mg tablet twice daily for 4 days, then increase to one 1 mg tablet twice daily. 53 tablet 0  . varenicline (CHANTIX) 1 MG tablet Take 1 tablet (1 mg total) by mouth 2 (two) times daily. 60 tablet 2   No current facility-administered medications for this visit.     Psychiatric Specialty Exam: Review of Systems  All other systems reviewed and are negative.   There were no vitals taken for this visit.There is no height or weight on file to calculate BMI.  General Appearance: NA  Eye Contact:  NA  Speech:  Clear and Coherent and Normal Rate  Volume:  Normal  Mood:  Less anxious.  Affect:  NA  Thought Process:  Goal Directed and Linear   Orientation:  Full (Time, Place, and Person)  Thought Content: Logical   Suicidal Thoughts:  No  Homicidal Thoughts:  No  Memory:  Immediate;   Good Recent;   Good Remote;   Good  Judgement:  Good  Insight:  Good  Psychomotor Activity:  NA  Concentration:  Concentration: Good  Recall:  Good  Fund of Knowledge: Good  Language: Good  Akathisia:  Negative  Handed:  Right  AIMS (if indicated): not done  Assets:  Communication Skills Desire for Improvement Financial Resources/Insurance Housing Intimacy Social Support Talents/Skills  ADL's:  Intact  Cognition: WNL  Sleep:  Good   Screenings: GAD-7     Office Visit from 05/10/2019 in Primary Care at [place] from 04/12/2019 in Primary Care at [facility]  Total GAD-7 Score  11  19    PHQ2-9     Office Visit from 11/22/2019 in Primary Care at [place] from 07/20/2019 in Primary Care at [place] from 05/14/2019 in Primary Care at [place] from 05/10/2019 in [place] [facility] [place] from 04/12/2019 in Primary Care at [place]  PHQ-2 Total Score  0  0  0  0  2  PHQ-9 Total Score  --  --  --  --  11       Assessment and Plan: 44 yo married male with a hx of anxiety and some depression who has been troughout his life struggling with problems focusing, loosing interest, task completion as well as excessive worrying and occasional panic attacks and depression secondary to his focusing problems. These have impinged on his ability to function effectively at work (but also at home) and in the past he has lost two jobs because of performance deficiencies (struggled with writing proposals). 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 He has no component of hyperactivity. Corin reports in addition to ADD symptoms also initial and middle insomnia, racing thoughts, excessive worrying and occasional panic attacks. He denies feeling  hopeless, helpless, suicidal. His  appetite is normal.We continued duloxetine, added trazodone for insomnia which appears to work well and Adderall20 mg bid. It works well and his concentration improved while anxiety decreased.Sleep is better as well and he does not need to use trazodone often. He moved to a new house in early December and got married (via [place]) on February 14.   Dx: GAD, ADHD adult  Plan: Continue duloxetine 60 mg,trazodone 100 mg prn sleepand Adderall20mg  bid for ADD.Next appointment inthree months.The plan was discussed with patient who had an opportunity to ask questions and these were all answered. I spend20 minutes inphone consultationwith the patient.    Stephanie Acre, MD 02/26/2020, 1:43 PM

## 2020-03-21 ENCOUNTER — Telehealth (HOSPITAL_COMMUNITY): Payer: Self-pay | Admitting: *Deleted

## 2020-03-21 ENCOUNTER — Other Ambulatory Visit: Payer: Self-pay | Admitting: Family Medicine

## 2020-03-21 DIAGNOSIS — E785 Hyperlipidemia, unspecified: Secondary | ICD-10-CM

## 2020-03-21 NOTE — Telephone Encounter (Signed)
Requested medication (s) are due for refill today: Yes  Requested medication (s) are on the active medication list: Yes  Last refill:  03/12/19  Future visit scheduled: No  Notes to clinic:  Prescription has expired.    Requested Prescriptions  Pending Prescriptions Disp Refills   atorvastatin (LIPITOR) 40 MG tablet [Pharmacy Med Name: ATORVASTATIN 40MG  TABLETS] 90 tablet 3    Sig: TAKE 1 TABLET(40 MG) BY MOUTH DAILY      Cardiovascular:  Antilipid - Statins Failed - 03/21/2020  3:47 AM      Failed - LDL in normal range and within 360 days    LDL Chol Calc (NIH)  Date Value Ref Range Status  11/22/2019 57 0 - 99 mg/dL Final          Passed - Total Cholesterol in normal range and within 360 days    Cholesterol, Total  Date Value Ref Range Status  11/22/2019 116 100 - 199 mg/dL Final          Passed - HDL in normal range and within 360 days    HDL  Date Value Ref Range Status  11/22/2019 42 >39 mg/dL Final          Passed - Triglycerides in normal range and within 360 days    Triglycerides  Date Value Ref Range Status  11/22/2019 85 0 - 149 mg/dL Final          Passed - Patient is not pregnant      Passed - Valid encounter within last 12 months    Recent Outpatient Visits           4 months ago Hyperlipidemia, unspecified hyperlipidemia type   [facility], Lilia Argue, MD   8 months ago Type 2 diabetes mellitus with diabetic polyneuropathy, without long-term current use of insulin [facility])   [facility], Lilia Argue, MD   10 months ago Lumbar contusion, initial encounter   Primary Care at [facility], [place], MD   10 months ago GAD (generalized anxiety disorder)   [facility], Lilia Argue, MD   11 months ago GAD (generalized anxiety disorder)   [facility], Lilia Argue, MD               Signed Prescriptions Disp Refills   glimepiride (AMARYL) 4 MG tablet 90 tablet 0    Sig: TAKE 1  TABLET BY MOUTH DAILY BEFORE BREAKFAST.      Endocrinology:  Diabetes - Sulfonylureas Passed - 03/21/2020  3:47 AM      Passed - HBA1C is between 0 and 7.9 and within 180 days    Hgb A1c MFr Bld  Date Value Ref Range Status  11/22/2019 7.2 (H) 4.8 - 5.6 % Final    Comment:             Prediabetes: 5.7 - 6.4          Diabetes: >6.4          Glycemic control for adults with diabetes: <7.0           Passed - Valid encounter within last 6 months    Recent Outpatient Visits           4 months ago Hyperlipidemia, unspecified hyperlipidemia type   [facility], Lilia Argue, MD   8 months ago Type 2 diabetes mellitus with diabetic polyneuropathy, without long-term current use of insulin ([place])  [facility], Lilia Argue, MD   10 months ago Lumbar contusion, initial encounter   Primary Care at [facility], [place], MD   10 months ago GAD (generalized anxiety disorder)   [facility], Lilia Argue, MD   11 months ago GAD (generalized anxiety disorder)   [facility], Lilia Argue, MD

## 2020-03-21 NOTE — Telephone Encounter (Signed)
Pt called stating that the Adderall Rx's that Dr. Montel Culver sent in were not correct. Pt states that he has been taking 20mg  bid, which is reflected in Dr. Note but in orders, or med list. Please review and advise.

## 2020-03-25 ENCOUNTER — Ambulatory Visit (HOSPITAL_BASED_OUTPATIENT_CLINIC_OR_DEPARTMENT_OTHER): Admit: 2020-03-25 | Payer: 59 | Admitting: Orthopedic Surgery

## 2020-03-25 ENCOUNTER — Encounter (HOSPITAL_BASED_OUTPATIENT_CLINIC_OR_DEPARTMENT_OTHER): Payer: Self-pay

## 2020-03-25 SURGERY — CARPAL TUNNEL RELEASE
Anesthesia: Regional | Laterality: Left

## 2020-03-25 MED ORDER — AMPHETAMINE-DEXTROAMPHETAMINE 20 MG PO TABS: 20.0000 mg | ORAL_TABLET | Freq: Two times a day (BID) | ORAL | 0 refills | Status: DC

## 2020-03-25 NOTE — Telephone Encounter (Signed)
Done

## 2020-04-23 ENCOUNTER — Telehealth (HOSPITAL_COMMUNITY): Payer: Self-pay

## 2020-04-23 ENCOUNTER — Other Ambulatory Visit (HOSPITAL_COMMUNITY): Payer: Self-pay | Admitting: Psychiatry

## 2020-04-23 MED ORDER — AMPHETAMINE-DEXTROAMPHETAMINE 20 MG PO TABS: 20.0000 mg | ORAL_TABLET | Freq: Two times a day (BID) | ORAL | 0 refills | Status: DC

## 2020-04-23 NOTE — Telephone Encounter (Signed)
Patient called requesting a refill on his Adderall 20mg  1 tab po BID to be sent to [facility] on [address] in [place]. Followup scheduled for 05/28/20. Thank you

## 2020-04-23 NOTE — Telephone Encounter (Signed)
Done

## 2020-05-07 ENCOUNTER — Other Ambulatory Visit: Payer: Self-pay | Admitting: Family Medicine

## 2020-05-07 DIAGNOSIS — E119 Type 2 diabetes mellitus without complications: Secondary | ICD-10-CM

## 2020-05-07 NOTE — Telephone Encounter (Signed)
Requested Prescriptions  Pending Prescriptions Disp Refills   metFORMIN (GLUCOPHAGE-XR) 500 MG 24 hr tablet [Pharmacy Med Name: METFORMIN ER 500MG 24HR TABS] 368 tablet 0    Sig: TAKE 4 TABLETS BY MOUTH DAILY BEFORE BREAKFAST     Endocrinology:  Diabetes - Biguanides Passed - 05/07/2020  3:47 AM      Passed - Cr in normal range and within 360 days    Creatinine, Ser  Date Value Ref Range Status  11/22/2019 1.22 0.76 - 1.27 mg/dL Final         Passed - HBA1C is between 0 and 7.9 and within 180 days    Hgb A1c MFr Bld  Date Value Ref Range Status  11/22/2019 7.2 (H) 4.8 - 5.6 % Final    Comment:             Prediabetes: 5.7 - 6.4          Diabetes: >6.4          Glycemic control for adults with diabetes: <7.0          Passed - eGFR in normal range and within 360 days    GFR calc Af Amer  Date Value Ref Range Status  11/22/2019 82 >59 mL/min/1.73 Final   GFR calc non Af Amer  Date Value Ref Range Status  11/22/2019 71 >59 mL/min/1.73 Final         Passed - Valid encounter within last 6 months    Recent Outpatient Visits          5 months ago Hyperlipidemia, unspecified hyperlipidemia type   Primary Care at [facility], Lilia Argue, MD   9 months ago Type 2 diabetes mellitus with diabetic polyneuropathy, without long-term current use of insulin [facility])   [facility], Lilia Argue, MD   11 months ago Lumbar contusion, initial encounter   Primary Care at [facility], [place], MD   12 months ago GAD (generalized anxiety disorder)   [facility], Lilia Argue, MD   1 year ago GAD (generalized anxiety disorder)   [facility], Lilia Argue, MD

## 2020-05-28 ENCOUNTER — Telehealth (INDEPENDENT_AMBULATORY_CARE_PROVIDER_SITE_OTHER): Payer: 59 | Admitting: Psychiatry

## 2020-05-28 ENCOUNTER — Other Ambulatory Visit: Payer: Self-pay

## 2020-05-28 DIAGNOSIS — F909 Attention-deficit hyperactivity disorder, unspecified type: Secondary | ICD-10-CM | POA: Diagnosis not present

## 2020-05-28 DIAGNOSIS — F411 Generalized anxiety disorder: Secondary | ICD-10-CM | POA: Diagnosis not present

## 2020-05-28 MED ORDER — TRAZODONE HCL 100 MG PO TABS: 100.0000 mg | ORAL_TABLET | Freq: Every evening | ORAL | 0 refills | Status: DC | PRN

## 2020-05-28 MED ORDER — AMPHETAMINE-DEXTROAMPHETAMINE 20 MG PO TABS
20.0000 mg | ORAL_TABLET | Freq: Two times a day (BID) | ORAL | 0 refills | Status: DC
Start: 2020-05-28 — End: 2020-08-28

## 2020-05-28 MED ORDER — AMPHETAMINE-DEXTROAMPHETAMINE 20 MG PO TABS
20.0000 mg | ORAL_TABLET | Freq: Two times a day (BID) | ORAL | 0 refills | Status: DC
Start: 2020-06-27 — End: 2020-08-28

## 2020-05-28 MED ORDER — AMPHETAMINE-DEXTROAMPHETAMINE 20 MG PO TABS
20.0000 mg | ORAL_TABLET | Freq: Two times a day (BID) | ORAL | 0 refills | Status: DC
Start: 2020-07-27 — End: 2020-08-28

## 2020-05-28 NOTE — Progress Notes (Signed)
[facility] MD/PA/NP OP Progress Note  05/28/2020 1:41 PM Jose [name]  MRN:  [id number] Interview was conducted by phone and I verified that I was speaking with the correct person using two identifiers. I discussed the limitations of evaluation and management by telemedicine and  the availability of in person appointments. Patient expressed understanding and agreed to proceed. Patient location - home; physician - home office.  Chief Complaint: "I am doing well".  HPI: 44 yo married male with a hx of anxiety and some depression who has been troughout his life struggling with problems focusing, loosing interest, task completion as well as excessive worrying and occasional panic attacks and depression secondary to his focusing problems. These have impinged on his ability to function effectively at work (but also at home) and in the past he has lost two jobs because of performance deficiencies (struggled with writing proposals). 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 He has no component of hyperactivity. Kadarrius reports in addition to ADD symptoms also initial and middle insomnia, racing thoughts, excessive worrying and occasional panic attacks. He denies feeling hopeless, helpless, suicidal. His appetite is normal.We continued duloxetine, added trazodone for insomnia which appears to work well and Adderall20 mg bid. It works well and his concentration improved while anxiety decreased.Sleep is better as well and he does not need to use trazodone often.   Visit Diagnosis:    ICD-10-CM   1. Adult ADHD  F90.9   2. GAD (generalized anxiety disorder)  F41.1     Past Psychiatric History: Please see intake H&P.  Past Medical History:  Past Medical History:  Diagnosis Date  . GAD (generalized anxiety disorder)   . Hyperlipidemia   . Macular degeneration, bilateral   . Retinopathy   . Type II diabetes mellitus with complication,  uncontrolled (HCC)    retinopathy, neuropathy, microalbuminuria    Past Surgical History:  Procedure Laterality Date  . APPENDECTOMY    . HERNIA REPAIR    . TRIGGER FINGER RELEASE Right 10/25/2019   Procedure: RIGHT INDEX FINGER RELEASE TRIGGER FINGER/A-1 PULLEY;  Surgeon: Daryll Brod, MD;  Location: [facility];  Service: Orthopedics;  Laterality: Right;  IV REGIONAL FOREARM BLOCK    Family Psychiatric History: Reviewed.  Family History:  Family History  Problem Relation Age of Onset  . Diabetes Mother   . Hyperlipidemia Mother   . Stroke Mother   . Diabetes Father   . Hyperlipidemia Brother   . Stroke Brother   . ADD / ADHD Brother   . ADD / ADHD Son     Social History:  Social History   Socioeconomic History  . Marital status: Significant Other    Spouse name: Not on file  . Number of children: 2  . Years of education: Not on file  . Highest education level: Bachelor's degree (e.g., BA, AB, BS)  Occupational History  . Occupation: Multimedia programmer: [employer]  Tobacco Use  . Smoking status: Current Every Day Smoker    Packs/day: 1.00    Years: 20.00    Pack years: 20.00    Types: Cigarettes  . Smokeless tobacco: Never Used  Vaping Use  . Vaping Use: Never used  Substance and Sexual Activity  . Alcohol use: Not Currently  . Drug use: Never  . Sexual activity: Yes    Partners: Female    Birth control/protection: None    Comment: with  monogamous partner  Other Topics Concern  . Not on file  Social History Narrative   Pt has lived majority of life in [place]. Lives at home with partner, 2 kids, 8 cats, and 1 dog.    Social Determinants of Health   Financial Resource Strain:   . Difficulty of Paying Living Expenses:   Food Insecurity:   . Worried About Charity fundraiser in the Last Year:   . Arboriculturist in the Last Year:   Transportation Needs:   . Film/video editor (Medical):   Marland Kitchen Lack of  Transportation (Non-Medical):   Physical Activity:   . Days of Exercise per Week:   . Minutes of Exercise per Session:   Stress:   . Feeling of Stress :   Social Connections:   . Frequency of Communication with Friends and Family:   . Frequency of Social Gatherings with Friends and Family:   . Attends Religious Services:   . Active Member of Clubs or Organizations:   . Attends Archivist Meetings:   Marland Kitchen Marital Status:     Allergies: No Known Allergies  Metabolic Disorder Labs: Lab Results  Component Value Date   HGBA1C 7.2 (H) 11/22/2019   No results found for: PROLACTIN Lab Results  Component Value Date   CHOL 116 11/22/2019   TRIG 85 11/22/2019   HDL 42 11/22/2019   CHOLHDL 2.8 11/22/2019   LDLCALC 57 11/22/2019   LDLCALC 46 07/20/2019   Lab Results  Component Value Date   TSH 3.070 03/15/2019   TSH 1.990 12/12/2018    Therapeutic Level Labs: No results found for: LITHIUM No results found for: VALPROATE No components found for:  CBMZ  Current Medications: Current Outpatient Medications  Medication Sig Dispense Refill  . amphetamine-dextroamphetamine (ADDERALL) 20 MG tablet Take 1 tablet (20 mg total) by mouth 2 (two) times daily. 60 tablet 0  . [START ON 06/27/2020] amphetamine-dextroamphetamine (ADDERALL) 20 MG tablet Take 1 tablet (20 mg total) by mouth 2 (two) times daily. 60 tablet 0  . [START ON 07/27/2020] amphetamine-dextroamphetamine (ADDERALL) 20 MG tablet Take 1 tablet (20 mg total) by mouth 2 (two) times daily. 60 tablet 0  . atorvastatin (LIPITOR) 40 MG tablet Take 1 tablet (40 mg total) by mouth daily. 90 tablet 3  . dapagliflozin propanediol (FARXIGA) 10 MG TABS tablet Take 10 mg by mouth daily before breakfast. 30 tablet 1  . DULoxetine (CYMBALTA) 60 MG capsule Take 1 capsule (60 mg total) by mouth daily. 90 capsule 1  . Exenatide ER (BYDUREON BCISE) 2 MG/0.85ML AUIJ Inject 2 mg into the skin once a week. 4 pen 5  . gabapentin (NEURONTIN)  300 MG capsule TAKE 1 TO 2 CAPSULES(300 TO 600 MG) BY MOUTH AT BEDTIME 180 capsule 1  . glimepiride (AMARYL) 4 MG tablet TAKE 1 TABLET BY MOUTH DAILY BEFORE BREAKFAST. 90 tablet 0  . losartan (COZAAR) 50 MG tablet TAKE 1 TABLET(50 MG) BY MOUTH DAILY 90 tablet 1  . metFORMIN (GLUCOPHAGE-XR) 500 MG 24 hr tablet TAKE 4 TABLETS BY MOUTH DAILY BEFORE BREAKFAST 368 tablet 0  . Ranibizumab (LUCENTIS) 0.3 MG/0.05ML SOLN by Intravitreal route.     . tadalafil (CIALIS) 20 MG tablet TK 1 T PO  PO QD PRN FOR ERECTILE DYSFUNCTION 10 tablet 11  . tobramycin (TOBREX) 0.3 % ophthalmic solution INSTILL 1 DROP IN BOTH EYES 4 TIMES A DAY BEGIN 1 DAY  PRIOR TO TREATMENT CONTINUE THE DAY OF TREATMENT AND ONE  FULL DAY AFTER TREATMENT  5  . traMADol (ULTRAM) 50 MG tablet Take 1 tablet (50 mg total) by mouth every 6 (six) hours as needed. 20 tablet 0  . traZODone (DESYREL) 100 MG tablet Take 1 tablet (100 mg total) by mouth at bedtime as needed for sleep. 90 tablet 0  . varenicline (CHANTIX PAK) 0.5 MG X 11 & 1 MG X 42 tablet Take one 0.5 mg tablet by mouth once daily for 3 days, then increase to one 0.5 mg tablet twice daily for 4 days, then increase to one 1 mg tablet twice daily. 53 tablet 0  . varenicline (CHANTIX) 1 MG tablet Take 1 tablet (1 mg total) by mouth 2 (two) times daily. 60 tablet 2   No current facility-administered medications for this visit.      Psychiatric Specialty Exam: Review of Systems  All other systems reviewed and are negative.   There were no vitals taken for this visit.There is no height or weight on file to calculate BMI.  General Appearance: NA  Eye Contact:  NA  Speech:  Clear and Coherent and Normal Rate  Volume:  Normal  Mood:  Euthymic  Affect:  NA  Thought Process:  Goal Directed and Linear  Orientation:  Full (Time, Place, and Person)  Thought Content: Logical   Suicidal Thoughts:  No  Homicidal Thoughts:  No  Memory:  Immediate;   Good Recent;   Good Remote;   Good   Judgement:  Good  Insight:  Good  Psychomotor Activity:  NA  Concentration:  Concentration: Good  Recall:  Good  Fund of Knowledge: Good  Language: Good  Akathisia:  Negative  Handed:  Right  AIMS (if indicated): not done  Assets:  Communication Skills Desire for Improvement Financial Resources/Insurance Housing Intimacy Social Support Vocational/Educational  ADL's:  Intact  Cognition: WNL  Sleep:  Good   Screenings: GAD-7     Office Visit from 05/10/2019 in Primary Care at [place] from 04/12/2019 in Primary Care at [facility]  Total GAD-7 Score 11 19    PHQ2-9     Office Visit from 11/22/2019 in Primary Care at [place] from 07/20/2019 in Primary Care at [place] from 05/14/2019 in Primary Care at [facility] from 05/10/2019 in [place] at [place] from 04/12/2019 in Primary Care at [facility]  PHQ-2 Total Score 0 0 0 0 2  PHQ-9 Total Score - - - - 11       Assessment and Plan: 44 yo married male with a hx of anxiety and some depression who has been troughout his life struggling with problems focusing, loosing interest, task completion as well as excessive worrying and occasional panic attacks and depression secondary to his focusing problems. These have impinged on his ability to function effectively at work (but also at home) and in the past he has lost two jobs because of performance deficiencies (struggled with writing proposals). 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 He has no component of hyperactivity. Damon reports in addition to ADD symptoms also initial and middle insomnia, racing thoughts, excessive worrying and occasional panic attacks. He denies feeling hopeless, helpless, suicidal. His appetite is normal.We continued duloxetine, added trazodone for insomnia which appears to work well and Adderall20 mg bid. It works well and his concentration improved  while anxiety decreased.Sleep is better as well and he does not need to use trazodone  often.   Dx: GAD, ADHD adult  Plan: Continue duloxetine 60 mg,trazodone 100 mg prn sleepand Adderall20mg  bid for ADD.Next appointment inthree months.The plan was discussed with patient who had an opportunity to ask questions and these were all answered. I spend15 minutes inphone consultationwith the patient.    Stephanie Acre, MD 05/28/2020, 1:41 PM

## 2020-06-20 ENCOUNTER — Other Ambulatory Visit: Payer: Self-pay | Admitting: Family Medicine

## 2020-06-20 NOTE — Telephone Encounter (Signed)
Requested medication (s) are due for refill today:  yes  Requested medication (s) are on the active medication list: yes  Last refill:  03/21/2020  Future visit scheduled: no  Notes to clinic:  overdue for follow up and labs    Requested Prescriptions  Pending Prescriptions Disp Refills   glimepiride (AMARYL) 4 MG tablet [Pharmacy Med Name: GLIMEPIRIDE 4MG  TABLETS] 90 tablet 0    Sig: TAKE 1 TABLET BY MOUTH DAILY BEFORE BREAKFAST.      Endocrinology:  Diabetes - Sulfonylureas Failed - 06/20/2020  3:49 AM      Failed - HBA1C is between 0 and 7.9 and within 180 days    Hgb A1c MFr Bld  Date Value Ref Range Status  11/22/2019 7.2 (H) 4.8 - 5.6 % Final    Comment:             Prediabetes: 5.7 - 6.4          Diabetes: >6.4          Glycemic control for adults with diabetes: <7.0           Failed - Valid encounter within last 6 months    Recent Outpatient Visits           7 months ago Hyperlipidemia, unspecified hyperlipidemia type   Primary Care at [facility], Lilia Argue, MD   11 months ago Type 2 diabetes mellitus with diabetic polyneuropathy, without long-term current use of insulin [facility])   [facility], Lilia Argue, MD   1 year ago Lumbar contusion, initial encounter   Primary Care at [facility], Ines [place], MD   1 year ago GAD (generalized anxiety disorder)   [facility], Lilia Argue, MD   1 year ago GAD (generalized anxiety disorder)   [facility] at [facility], Lilia Argue, MD

## 2020-06-24 ENCOUNTER — Telehealth (HOSPITAL_COMMUNITY): Payer: Self-pay | Admitting: *Deleted

## 2020-06-24 NOTE — Telephone Encounter (Signed)
PA for Adderall tablet 20mg  has been approved via [employer]. Medication approved through 06/23/21. Case # [id number].

## 2020-06-27 ENCOUNTER — Other Ambulatory Visit: Payer: Self-pay | Admitting: Family Medicine

## 2020-07-20 ENCOUNTER — Other Ambulatory Visit: Payer: Self-pay | Admitting: Family Medicine

## 2020-07-20 DIAGNOSIS — E119 Type 2 diabetes mellitus without complications: Secondary | ICD-10-CM

## 2020-07-20 NOTE — Telephone Encounter (Signed)
Requested medications are due for refill today?  Yes  Requested medications are on active medication list?  Yes  Last Refill:  08/16/2019  # 90 with one refill.    Future visit scheduled?  No   Notes to Clinic:  Medication failed Rx refill protocol due to no valid encounter in the past 6 months and no labs within the past 180 days.  Last labs were performed in January 2021.

## 2020-08-10 ENCOUNTER — Other Ambulatory Visit (HOSPITAL_COMMUNITY): Payer: Self-pay | Admitting: Psychiatry

## 2020-08-25 ENCOUNTER — Other Ambulatory Visit (HOSPITAL_COMMUNITY): Payer: Self-pay | Admitting: Psychiatry

## 2020-08-28 ENCOUNTER — Other Ambulatory Visit: Payer: Self-pay

## 2020-08-28 ENCOUNTER — Telehealth (INDEPENDENT_AMBULATORY_CARE_PROVIDER_SITE_OTHER): Payer: 59 | Admitting: Psychiatry

## 2020-08-28 DIAGNOSIS — F909 Attention-deficit hyperactivity disorder, unspecified type: Secondary | ICD-10-CM

## 2020-08-28 DIAGNOSIS — F411 Generalized anxiety disorder: Secondary | ICD-10-CM | POA: Diagnosis not present

## 2020-08-28 MED ORDER — AMPHETAMINE-DEXTROAMPHETAMINE 20 MG PO TABS: 20.0000 mg | ORAL_TABLET | Freq: Two times a day (BID) | ORAL | 0 refills | Status: DC

## 2020-08-28 MED ORDER — ALPRAZOLAM 0.25 MG PO TABS
0.2500 mg | ORAL_TABLET | Freq: Two times a day (BID) | ORAL | 0 refills | Status: DC | PRN
Start: 2020-08-28 — End: 2020-11-13

## 2020-08-28 NOTE — Progress Notes (Signed)
[facility] MD/PA/NP OP Progress Note  08/28/2020 1:50 PM Jose [name]  MRN:  [id number] Interview was conducted by phone and I verified that I was speaking with the correct person using two identifiers. I discussed the limitations of evaluation and management by telemedicine and  the availability of in person appointments. Patient expressed understanding and agreed to proceed. Patient location - home; physician - home office.  Chief Complaint: More anxious.  HPI: 59 yomarriedmale with a hx of anxiety and some depression who has been troughout his life struggling with problems focusing, loosing interest, task completion as well as excessive worrying and occasional panic attacks and depression secondary to his focusing problems. These have impinged on his ability to function effectively at work (but also at home) and in the past he has lost two jobs because of performance deficiencies (struggled with writing proposals). 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 He has no component of hyperactivity. Marquavis reports in addition to ADD symptoms also initial and middle insomnia, racing thoughts, excessive worrying and occasional panic attacks. He denies feeling hopeless, helpless, suicidal. His appetite is normal.We continued duloxetine, added trazodone for insomnia and Adderall20 mg bid.It works well and his concentration improved.Sleep is better as well and he does not need to use trazodone often.Recent increase in work load/responsibility on the job breeds some anxiety. He was prescribed low dose of alprazolam by his PCP and found it beneficial - asking if he can continue taking it prn .   Visit Diagnosis:    ICD-10-CM   1. GAD (generalized anxiety disorder)  F41.1   2. Adult ADHD  F90.9     Past Psychiatric History: Please see intake H&P.  Past Medical History:  Past Medical History:  Diagnosis Date  . GAD (generalized anxiety  disorder)   . Hyperlipidemia   . Macular degeneration, bilateral   . Retinopathy   . Type II diabetes mellitus with complication, uncontrolled (HCC)    retinopathy, neuropathy, microalbuminuria    Past Surgical History:  Procedure Laterality Date  . APPENDECTOMY    . HERNIA REPAIR    . TRIGGER FINGER RELEASE Right 10/25/2019   Procedure: RIGHT INDEX FINGER RELEASE TRIGGER FINGER/A-1 PULLEY;  Surgeon: Daryll Brod, MD;  Location: [facility];  Service: Orthopedics;  Laterality: Right;  IV REGIONAL FOREARM BLOCK    Family Psychiatric History: Reviewed.  Family History:  Family History  Problem Relation Age of Onset  . Diabetes Mother   . Hyperlipidemia Mother   . Stroke Mother   . Diabetes Father   . Hyperlipidemia Brother   . Stroke Brother   . ADD / ADHD Brother   . ADD / ADHD Son     Social History:  Social History   Socioeconomic History  . Marital status: Significant Other    Spouse name: Not on file  . Number of children: 2  . Years of education: Not on file  . Highest education level: Bachelor's degree (e.g., BA, AB, BS)  Occupational History  . Occupation: Multimedia programmer: [employer]  Tobacco Use  . Smoking status: Current Every Day Smoker    Packs/day: 1.00    Years: 20.00    Pack years: 20.00    Types: Cigarettes  . Smokeless tobacco: Never Used  Vaping Use  . Vaping Use: Never used  Substance and Sexual Activity  . Alcohol use: Not Currently  . Drug use: Never  .  Sexual activity: Yes    Partners: Female    Birth control/protection: None    Comment: with monogamous partner  Other Topics Concern  . Not on file  Social History Narrative   Pt has lived majority of life in [place]. Lives at home with partner, 2 kids, 8 cats, and 1 dog.    Social Determinants of Health   Financial Resource Strain:   . Difficulty of Paying Living Expenses: Not on file  Food Insecurity:   . Worried About Charity fundraiser  in the Last Year: Not on file  . Ran Out of Food in the Last Year: Not on file  Transportation Needs:   . Lack of Transportation (Medical): Not on file  . Lack of Transportation (Non-Medical): Not on file  Physical Activity:   . Days of Exercise per Week: Not on file  . Minutes of Exercise per Session: Not on file  Stress:   . Feeling of Stress : Not on file  Social Connections:   . Frequency of Communication with Friends and Family: Not on file  . Frequency of Social Gatherings with Friends and Family: Not on file  . Attends Religious Services: Not on file  . Active Member of Clubs or Organizations: Not on file  . Attends Archivist Meetings: Not on file  . Marital Status: Not on file    Allergies: No Known Allergies  Metabolic Disorder Labs: Lab Results  Component Value Date   HGBA1C 7.2 (H) 11/22/2019   No results found for: PROLACTIN Lab Results  Component Value Date   CHOL 116 11/22/2019   TRIG 85 11/22/2019   HDL 42 11/22/2019   CHOLHDL 2.8 11/22/2019   LDLCALC 57 11/22/2019   LDLCALC 46 07/20/2019   Lab Results  Component Value Date   TSH 3.070 03/15/2019   TSH 1.990 12/12/2018    Therapeutic Level Labs: No results found for: LITHIUM No results found for: VALPROATE No components found for:  CBMZ  Current Medications: Current Outpatient Medications  Medication Sig Dispense Refill  . ALPRAZolam (XANAX) 0.25 MG tablet Take 1 tablet (0.25 mg total) by mouth 2 (two) times daily as needed for anxiety. 90 tablet 0  . [START ON 10/28/2020] amphetamine-dextroamphetamine (ADDERALL) 20 MG tablet Take 1 tablet (20 mg total) by mouth 2 (two) times daily. 60 tablet 0  . [START ON 09/27/2020] amphetamine-dextroamphetamine (ADDERALL) 20 MG tablet Take 1 tablet (20 mg total) by mouth 2 (two) times daily. 60 tablet 0  . amphetamine-dextroamphetamine (ADDERALL) 20 MG tablet Take 1 tablet (20 mg total) by mouth 2 (two) times daily. 60 tablet 0  . atorvastatin (LIPITOR)  40 MG tablet Take 1 tablet (40 mg total) by mouth daily. 90 tablet 3  . dapagliflozin propanediol (FARXIGA) 10 MG TABS tablet Take 10 mg by mouth daily before breakfast. 30 tablet 1  . DULoxetine (CYMBALTA) 60 MG capsule TAKE 1 CAPSULE(60 MG) BY MOUTH DAILY 90 capsule 1  . Exenatide ER (BYDUREON BCISE) 2 MG/0.85ML AUIJ Inject 2 mg into the skin once a week. 4 pen 5  . gabapentin (NEURONTIN) 300 MG capsule TAKE 1 TO 2 CAPSULES(300 TO 600 MG) BY MOUTH AT BEDTIME 180 capsule 1  . glimepiride (AMARYL) 4 MG tablet TAKE 1 TABLET BY MOUTH DAILY BEFORE BREAKFAST. 90 tablet 0  . losartan (COZAAR) 50 MG tablet TAKE 1 TABLET(50 MG) BY MOUTH DAILY 30 tablet 0  . metFORMIN (GLUCOPHAGE-XR) 500 MG 24 hr tablet TAKE 4 TABLETS BY MOUTH DAILY  BEFORE BREAKFAST 368 tablet 0  . Ranibizumab (LUCENTIS) 0.3 MG/0.05ML SOLN by Intravitreal route.     . tadalafil (CIALIS) 20 MG tablet TK 1 T PO  PO QD PRN FOR ERECTILE DYSFUNCTION 10 tablet 11  . tobramycin (TOBREX) 0.3 % ophthalmic solution INSTILL 1 DROP IN BOTH EYES 4 TIMES A DAY BEGIN 1 DAY  PRIOR TO TREATMENT CONTINUE THE DAY OF TREATMENT AND ONE FULL DAY AFTER TREATMENT  5  . traMADol (ULTRAM) 50 MG tablet Take 1 tablet (50 mg total) by mouth every 6 (six) hours as needed. 20 tablet 0  . traZODone (DESYREL) 100 MG tablet TAKE 1 TABLET(100 MG) BY MOUTH AT BEDTIME AS NEEDED FOR SLEEP 90 tablet 0   No current facility-administered medications for this visit.      Psychiatric Specialty Exam: Review of Systems  Psychiatric/Behavioral: The patient is nervous/anxious.   All other systems reviewed and are negative.   There were no vitals taken for this visit.There is no height or weight on file to calculate BMI.  General Appearance: NA  Eye Contact:  NA  Speech:  Clear and Coherent and Normal Rate  Volume:  Normal  Mood:  Anxious  Affect:  NA  Thought Process:  Goal Directed and Linear  Orientation:  Full (Time, Place, and Person)  Thought Content: Logical    Suicidal Thoughts:  No  Homicidal Thoughts:  No  Memory:  Immediate;   Good Recent;   Good Remote;   Good  Judgement:  Good  Insight:  Good  Psychomotor Activity:  NA  Concentration:  Concentration: Good  Recall:  Good  Fund of Knowledge: Good  Language: Good  Akathisia:  Negative  Handed:  Right  AIMS (if indicated): not done  Assets:  Communication Skills Desire for Improvement Financial Resources/Insurance Housing Intimacy Social Support Vocational/Educational  ADL's:  Intact  Cognition: WNL  Sleep:  Good   Screenings: GAD-7     Office Visit from 05/10/2019 in Primary Care at [facility] from 04/12/2019 in Primary Care at [facility]  Total GAD-7 Score 11 19    PHQ2-9     Office Visit from 11/22/2019 in Primary Care at [facility] from 07/20/2019 in Primary Care at [facility] from 05/14/2019 in Primary Care at [facility] from 05/10/2019 in [facility] from 04/12/2019 in Primary Care at [facility]  PHQ-2 Total Score 0 0 0 0 2  PHQ-9 Total Score -- -- -- -- 11       Assessment and Plan: 84 yomarriedmale with a hx of anxiety and some depression who has been troughout his life struggling with problems focusing, loosing interest, task completion as well as excessive worrying and occasional panic attacks and depression secondary to his focusing problems. These have impinged on his ability to function effectively at work (but also at home) and in the past he has lost two jobs because of performance deficiencies (struggled with writing proposals). 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 He has no component of hyperactivity. Cesare reports in addition to ADD symptoms also initial and middle insomnia, racing thoughts, excessive worrying and occasional panic attacks. He denies feeling hopeless, helpless, suicidal. His appetite is normal.We continued duloxetine, added  trazodone for insomnia and Adderall20 mg bid.It works well and his concentration improved.Sleep is better as well and he does not need to use trazodone often.Recent increase in work load/responsibility on the job breeds  some anxiety. He was prescribed low dose of alprazolam by his PCP and found it beneficial - asking if he can continue taking it prn .  Dx: GAD, ADHD adult  Plan: Continue duloxetine 60 mg,trazodone 100 mg prn sleep, Adderall20mg  bid for ADD and alprazolam 0.25 mg prn anxiety.Next appointment inthree months.The plan was discussed with patient who had an opportunity to ask questions and these were all answered. I spend73minutes inphone consultationwith the patient.   Stephanie Acre, MD 08/28/2020, 1:50 PM

## 2020-10-30 ENCOUNTER — Telehealth (HOSPITAL_COMMUNITY): Payer: Self-pay | Admitting: *Deleted

## 2020-10-30 ENCOUNTER — Other Ambulatory Visit (HOSPITAL_COMMUNITY): Payer: Self-pay | Admitting: Psychiatry

## 2020-10-30 MED ORDER — AMPHETAMINE-DEXTROAMPHETAMINE 20 MG PO TABS: 20.0000 mg | ORAL_TABLET | Freq: Two times a day (BID) | ORAL | 0 refills | Status: DC

## 2020-10-30 NOTE — Telephone Encounter (Signed)
Patients pharmacy called and stated that they could not fill Adderall script for 10/28/20 because they were having IT problems and their system would not let it go through.  They requested that it be resent.

## 2020-10-30 NOTE — Telephone Encounter (Signed)
Done

## 2020-11-13 ENCOUNTER — Telehealth (HOSPITAL_COMMUNITY): Payer: Self-pay | Admitting: *Deleted

## 2020-11-13 ENCOUNTER — Other Ambulatory Visit (HOSPITAL_COMMUNITY): Payer: Self-pay | Admitting: Psychiatry

## 2020-11-13 MED ORDER — ALPRAZOLAM 0.25 MG PO TABS
0.2500 mg | ORAL_TABLET | Freq: Two times a day (BID) | ORAL | 0 refills | Status: DC | PRN
Start: 2020-11-13 — End: 2020-11-27

## 2020-11-13 NOTE — Telephone Encounter (Signed)
Done

## 2020-11-13 NOTE — Telephone Encounter (Signed)
Patient is taking Xanax two times a day and requests a refill. Please review.

## 2020-11-27 ENCOUNTER — Other Ambulatory Visit: Payer: Self-pay

## 2020-11-27 ENCOUNTER — Telehealth (INDEPENDENT_AMBULATORY_CARE_PROVIDER_SITE_OTHER): Payer: 59 | Admitting: Psychiatry

## 2020-11-27 DIAGNOSIS — F411 Generalized anxiety disorder: Secondary | ICD-10-CM

## 2020-11-27 DIAGNOSIS — F909 Attention-deficit hyperactivity disorder, unspecified type: Secondary | ICD-10-CM

## 2020-11-27 MED ORDER — AMPHETAMINE-DEXTROAMPHETAMINE 20 MG PO TABS: 20.0000 mg | ORAL_TABLET | Freq: Two times a day (BID) | ORAL | 0 refills | Status: DC

## 2020-11-27 MED ORDER — ALPRAZOLAM 0.25 MG PO TABS: 0.2500 mg | ORAL_TABLET | Freq: Two times a day (BID) | ORAL | 2 refills | Status: DC | PRN

## 2020-11-27 MED ORDER — TRAZODONE HCL 100 MG PO TABS: ORAL_TABLET | ORAL | 0 refills | Status: DC

## 2020-11-27 NOTE — Progress Notes (Signed)
[facility] MD/PA/NP OP Progress Note  11/27/2020 1:44 PM Jose [name]  MRN:  [id number] Interview was conducted by phone and I verified that I was speaking with the correct person using two identifiers. I discussed the limitations of evaluation and management by telemedicine and  the availability of in person appointments. Patient expressed understanding and agreed to proceed. Participants in the visit: patient (location - home); physician (location - home office).  Chief Complaint: Some anxiety.  HPI: 44yomarriedmale with a hx of anxiety and some depression who has been troughout his life struggling with problems focusing, loosing interest, task completion as well as excessive worrying and occasional panic attacks and depression secondary to his focusing problems. These have impinged on his ability to function effectively at work (but also at home) and in the past he has lost two jobs because of performance deficiencies (struggled with writing proposals). 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 He has no component of hyperactivity. Jose [name] reports in addition to ADD symptoms also initial and middle insomnia, racing thoughts, excessive worrying and occasional panic attacks. He denies feeling hopeless, helpless, suicidal. His appetite is normal.We continued duloxetine, added trazodone for insomnia and Adderall20 mg bid.It works well and his concentration improved.Sleep is better as well and he does not need to use trazodone often.Recent increase in work load/responsibility on the job breeds some anxiety. He is prescribed low dose of alprazolam and finds it helpful for anxiety prn . He has macular degeneration and has just undergone surgery on one eye (next month on the other eye).   Visit Diagnosis:    ICD-10-CM   1. Adult ADHD  F90.9   2. GAD (generalized anxiety disorder)  F41.1     Past Psychiatric History: Please see intake  H&P.  Past Medical History:  Past Medical History:  Diagnosis Date  . GAD (generalized anxiety disorder)   . Hyperlipidemia   . Macular degeneration, bilateral   . Retinopathy   . Type II diabetes mellitus with complication, uncontrolled (HCC)    retinopathy, neuropathy, microalbuminuria    Past Surgical History:  Procedure Laterality Date  . APPENDECTOMY    . HERNIA REPAIR    . TRIGGER FINGER RELEASE Right 10/25/2019   Procedure: RIGHT INDEX FINGER RELEASE TRIGGER FINGER/A-1 PULLEY;  Surgeon: Daryll Brod, MD;  Location: [facility];  Service: Orthopedics;  Laterality: Right;  IV REGIONAL FOREARM BLOCK    Family Psychiatric History: Reviewed.  Family History:  Family History  Problem Relation Age of Onset  . Diabetes Mother   . Hyperlipidemia Mother   . Stroke Mother   . Diabetes Father   . Hyperlipidemia Brother   . Stroke Brother   . ADD / ADHD Brother   . ADD / ADHD Son     Social History:  Social History   Socioeconomic History  . Marital status: Significant Other    Spouse name: Not on file  . Number of children: 2  . Years of education: Not on file  . Highest education level: Bachelor's degree (e.g., BA, AB, BS)  Occupational History  . Occupation: Multimedia programmer: [employer]  Tobacco Use  . Smoking status: Current Every Day Smoker    Packs/day: 1.00    Years: 20.00    Pack years: 20.00    Types: Cigarettes  . Smokeless tobacco: Never Used  Vaping Use  . Vaping Use: Never used  Substance and Sexual Activity  .  Alcohol use: Not Currently  . Drug use: Never  . Sexual activity: Yes    Partners: Female    Birth control/protection: None    Comment: with monogamous partner  Other Topics Concern  . Not on file  Social History Narrative   Pt has lived majority of life in [place]. Lives at home with partner, 2 kids, 8 cats, and 1 dog.    Social Determinants of Health   Financial Resource Strain: Not on file   Food Insecurity: Not on file  Transportation Needs: Not on file  Physical Activity: Not on file  Stress: Not on file  Social Connections: Not on file    Allergies: No Known Allergies  Metabolic Disorder Labs: Lab Results  Component Value Date   HGBA1C 7.2 (H) 11/22/2019   No results found for: PROLACTIN Lab Results  Component Value Date   CHOL 116 11/22/2019   TRIG 85 11/22/2019   HDL 42 11/22/2019   CHOLHDL 2.8 11/22/2019   LDLCALC 57 11/22/2019   LDLCALC 46 07/20/2019   Lab Results  Component Value Date   TSH 3.070 03/15/2019   TSH 1.990 12/12/2018    Therapeutic Level Labs: No results found for: LITHIUM No results found for: VALPROATE No components found for:  CBMZ  Current Medications: Current Outpatient Medications  Medication Sig Dispense Refill  . [START ON 11/28/2020] amphetamine-dextroamphetamine (ADDERALL) 20 MG tablet Take 1 tablet (20 mg total) by mouth 2 (two) times daily. 60 tablet 0  . [START ON 12/28/2020] amphetamine-dextroamphetamine (ADDERALL) 20 MG tablet Take 1 tablet (20 mg total) by mouth 2 (two) times daily. 60 tablet 0  . [START ON 01/27/2021] amphetamine-dextroamphetamine (ADDERALL) 20 MG tablet Take 1 tablet (20 mg total) by mouth 2 (two) times daily. 60 tablet 0  . [START ON 12/14/2020] ALPRAZolam (XANAX) 0.25 MG tablet Take 1 tablet (0.25 mg total) by mouth 2 (two) times daily as needed for anxiety. 60 tablet 2  . atorvastatin (LIPITOR) 40 MG tablet Take 1 tablet (40 mg total) by mouth daily. 90 tablet 3  . dapagliflozin propanediol (FARXIGA) 10 MG TABS tablet Take 10 mg by mouth daily before breakfast. 30 tablet 1  . DULoxetine (CYMBALTA) 60 MG capsule TAKE 1 CAPSULE(60 MG) BY MOUTH DAILY 90 capsule 1  . Exenatide ER (BYDUREON BCISE) 2 MG/0.85ML AUIJ Inject 2 mg into the skin once a week. 4 pen 5  . gabapentin (NEURONTIN) 300 MG capsule TAKE 1 TO 2 CAPSULES(300 TO 600 MG) BY MOUTH AT BEDTIME 180 capsule 1  . glimepiride (AMARYL) 4 MG tablet TAKE  1 TABLET BY MOUTH DAILY BEFORE BREAKFAST. 90 tablet 0  . losartan (COZAAR) 50 MG tablet TAKE 1 TABLET(50 MG) BY MOUTH DAILY 30 tablet 0  . metFORMIN (GLUCOPHAGE-XR) 500 MG 24 hr tablet TAKE 4 TABLETS BY MOUTH DAILY BEFORE BREAKFAST 368 tablet 0  . Ranibizumab (LUCENTIS) 0.3 MG/0.05ML SOLN by Intravitreal route.     . tadalafil (CIALIS) 20 MG tablet TK 1 T PO  PO QD PRN FOR ERECTILE DYSFUNCTION 10 tablet 11  . tobramycin (TOBREX) 0.3 % ophthalmic solution INSTILL 1 DROP IN BOTH EYES 4 TIMES A DAY BEGIN 1 DAY  PRIOR TO TREATMENT CONTINUE THE DAY OF TREATMENT AND ONE FULL DAY AFTER TREATMENT  5  . traMADol (ULTRAM) 50 MG tablet Take 1 tablet (50 mg total) by mouth every 6 (six) hours as needed. 20 tablet 0  . [START ON 02/23/2021] traZODone (DESYREL) 100 MG tablet TAKE 1 TABLET(100 MG) BY MOUTH  AT BEDTIME AS NEEDED FOR SLEEP 90 tablet 0   No current facility-administered medications for this visit.      Psychiatric Specialty Exam: Review of Systems  Psychiatric/Behavioral: The patient is nervous/anxious.   All other systems reviewed and are negative.   There were no vitals taken for this visit.There is no height or weight on file to calculate BMI.  General Appearance: NA  Eye Contact:  NA  Speech:  Clear and Coherent and Normal Rate  Volume:  Normal  Mood:  Anxious  Affect:  NA  Thought Process:  Goal Directed and Linear  Orientation:  Full (Time, Place, and Person)  Thought Content: Logical   Suicidal Thoughts:  No  Homicidal Thoughts:  No  Memory:  Immediate;   Good Recent;   Good Remote;   Good  Judgement:  Good  Insight:  Good  Psychomotor Activity:  NA  Concentration:  Concentration: Good  Recall:  Good  Fund of Knowledge: Good  Language: Good  Akathisia:  Negative  Handed:  Right  AIMS (if indicated): not done  Assets:  Communication Skills Desire for Improvement Financial Resources/Insurance Housing Social Support Vocational/Educational  ADL's:  Intact  Cognition:  WNL  Sleep:  Fair   Screenings: GAD-7   [place] Office Visit from 05/10/2019 in Primary Care at [facility] from 04/12/2019 in Primary Care at [facility]  Total GAD-7 Score 11 19    PHQ2-9   [place] Office Visit from 11/22/2019 in Primary Care at [facility] from 07/20/2019 in Primary Care at [facility] from 05/14/2019 in Primary Care at [facility] from 05/10/2019 in [facility] at [facility] from 04/12/2019 in [facility] at [facility]  PHQ-2 Total Score 0 0 0 0 2  PHQ-9 Total Score - - - - 11       Assessment and Plan: 44yomarriedmale with a hx of anxiety and some depression who has been troughout his life struggling with problems focusing, loosing interest, task completion as well as excessive worrying and occasional panic attacks and depression secondary to his focusing problems. These have impinged on his ability to function effectively at work (but also at home) and in the past he has lost two jobs because of performance deficiencies (struggled with writing proposals). 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 He has no component of hyperactivity. Jose [name] reports in addition to ADD symptoms also initial and middle insomnia, racing thoughts, excessive worrying and occasional panic attacks. He denies feeling hopeless, helpless, suicidal. His appetite is normal.We continued duloxetine, added trazodone for insomnia and Adderall20 mg bid.It works well and his concentration improved.Sleep is better as well and he does not need to use trazodone often.Recent increase in work load/responsibility on the job breeds some anxiety. He is prescribed low dose of alprazolam and finds it helpful for anxiety prn . He has macular degeneration and has just undergone surgery on one eye (next month on the other eye).  Dx: GAD, ADHD adult  Plan: Continue duloxetine 60 mg,trazodone 100 mg prn sleep,  Adderall'20mg'$  bid for ADD and alprazolam 0.25 mg prn anxiety.Next appointment inthree months with a new provider.The plan was discussed with patient who had an opportunity to ask questions and these were all answered. I spend37mnutes inphone consultationwith the patient.   OStephanie Acre MD 11/27/2020, 1:44 PM

## 2021-01-05 ENCOUNTER — Other Ambulatory Visit: Payer: Self-pay | Admitting: Nephrology

## 2021-01-05 DIAGNOSIS — N1831 Chronic kidney disease, stage 3a: Secondary | ICD-10-CM

## 2021-01-06 ENCOUNTER — Other Ambulatory Visit: Payer: Self-pay

## 2021-01-08 ENCOUNTER — Ambulatory Visit (INDEPENDENT_AMBULATORY_CARE_PROVIDER_SITE_OTHER): Payer: 59 | Admitting: Endocrinology

## 2021-01-08 ENCOUNTER — Other Ambulatory Visit: Payer: Self-pay

## 2021-01-08 VITALS — BP 160/86 | HR 92 | Ht 71.0 in | Wt 209.2 lb

## 2021-01-08 DIAGNOSIS — E1142 Type 2 diabetes mellitus with diabetic polyneuropathy: Secondary | ICD-10-CM | POA: Diagnosis not present

## 2021-01-08 LAB — POCT GLYCOSYLATED HEMOGLOBIN (HGB A1C): Hemoglobin A1C: 7.3 % — AB (ref 4.0–5.6)

## 2021-01-08 MED ORDER — GLIMEPIRIDE 1 MG PO TABS: 1.0000 mg | ORAL_TABLET | Freq: Every day | ORAL | 3 refills | Status: DC

## 2021-01-08 MED ORDER — TRULICITY 3 MG/0.5ML ~~LOC~~ SOAJ
3.0000 mg | SUBCUTANEOUS | 3 refills | Status: DC
Start: 2021-01-08 — End: 2021-06-11

## 2021-01-08 NOTE — Progress Notes (Signed)
Subjective:    Patient ID: Jose [name], male    DOB: 08-14-1976, 45 y.o.   MRN: [id number]  HPI pt is referred by Dr Radene Ou, for diabetes.  Pt states DM was dx'ed in 0000000; it is complicated by DR, stage 3 CRI, and PN; he has never been on insulin; pt says his diet and exercise are fair; he has never had pancreatitis, pancreatic surgery, severe hypoglycemia or DKA.  He takes Trulicity and 2 oral meds.  Pt says he does not take [place].  He has hypoglycemia approx twice per month.   Past Medical History:  Diagnosis Date  . GAD (generalized anxiety disorder)   . Hyperlipidemia   . Macular degeneration, bilateral   . Retinopathy   . Type II diabetes mellitus with complication, uncontrolled (HCC)    retinopathy, neuropathy, microalbuminuria    Past Surgical History:  Procedure Laterality Date  . APPENDECTOMY    . HERNIA REPAIR    . TRIGGER FINGER RELEASE Right 10/25/2019   Procedure: RIGHT INDEX FINGER RELEASE TRIGGER FINGER/A-1 PULLEY;  Surgeon: Daryll Brod, MD;  Location: [facility];  Service: Orthopedics;  Laterality: Right;  IV REGIONAL FOREARM BLOCK    Social History   Socioeconomic History  . Marital status: Significant Other    Spouse name: Not on file  . Number of children: 2  . Years of education: Not on file  . Highest education level: Bachelor's degree (e.g., BA, AB, BS)  Occupational History  . Occupation: Multimedia programmer: [employer]  Tobacco Use  . Smoking status: Current Every Day Smoker    Packs/day: 1.00    Years: 20.00    Pack years: 20.00    Types: Cigarettes  . Smokeless tobacco: Never Used  Vaping Use  . Vaping Use: Never used  Substance and Sexual Activity  . Alcohol use: Not Currently  . Drug use: Never  . Sexual activity: Yes    Partners: Female    Birth control/protection: None    Comment: with monogamous partner  Other Topics Concern  . Not on file  Social History Narrative   Pt has lived  majority of life in [place]. Lives at home with partner, 2 kids, 8 cats, and 1 dog.    Social Determinants of Health   Financial Resource Strain: Not on file  Food Insecurity: Not on file  Transportation Needs: Not on file  Physical Activity: Not on file  Stress: Not on file  Social Connections: Not on file  Intimate Partner Violence: Not on file    Current Outpatient Medications on File Prior to Visit  Medication Sig Dispense Refill  . ALPRAZolam (XANAX) 0.25 MG tablet Take 1 tablet (0.25 mg total) by mouth 2 (two) times daily as needed for anxiety. 60 tablet 2  . amphetamine-dextroamphetamine (ADDERALL) 20 MG tablet Take 1 tablet (20 mg total) by mouth 2 (two) times daily. 60 tablet 0  . [START ON 01/27/2021] amphetamine-dextroamphetamine (ADDERALL) 20 MG tablet Take 1 tablet (20 mg total) by mouth 2 (two) times daily. 60 tablet 0  . atorvastatin (LIPITOR) 40 MG tablet Take 1 tablet (40 mg total) by mouth daily. 90 tablet 3  . DULoxetine (CYMBALTA) 60 MG capsule TAKE 1 CAPSULE(60 MG) BY MOUTH DAILY 90 capsule 1  . Exenatide ER (BYDUREON BCISE) 2 MG/0.85ML AUIJ Inject 2 mg into the skin once a week. 4 pen 5  . gabapentin (NEURONTIN) 300 MG capsule TAKE 1 TO 2 CAPSULES(300 TO 600  MG) BY MOUTH AT BEDTIME 180 capsule 1  . losartan (COZAAR) 50 MG tablet TAKE 1 TABLET(50 MG) BY MOUTH DAILY 30 tablet 0  . metFORMIN (GLUCOPHAGE-XR) 500 MG 24 hr tablet TAKE 4 TABLETS BY MOUTH DAILY BEFORE BREAKFAST 368 tablet 0  . Ranibizumab (LUCENTIS) 0.3 MG/0.05ML SOLN by Intravitreal route.     . tadalafil (CIALIS) 20 MG tablet TK 1 T PO  PO QD PRN FOR ERECTILE DYSFUNCTION 10 tablet 11  . tobramycin (TOBREX) 0.3 % ophthalmic solution INSTILL 1 DROP IN BOTH EYES 4 TIMES A DAY BEGIN 1 DAY  PRIOR TO TREATMENT CONTINUE THE DAY OF TREATMENT AND ONE FULL DAY AFTER TREATMENT  5  . traMADol (ULTRAM) 50 MG tablet Take 1 tablet (50 mg total) by mouth every 6 (six) hours as needed. 20 tablet 0  . [START ON 02/23/2021]  traZODone (DESYREL) 100 MG tablet TAKE 1 TABLET(100 MG) BY MOUTH AT BEDTIME AS NEEDED FOR SLEEP 90 tablet 0  . amphetamine-dextroamphetamine (ADDERALL) 20 MG tablet Take 1 tablet (20 mg total) by mouth 2 (two) times daily. 60 tablet 0   No current facility-administered medications on file prior to visit.    No Known Allergies  Family History  Problem Relation Age of Onset  . Diabetes Mother   . Hyperlipidemia Mother   . Stroke Mother   . Diabetes Father   . Hyperlipidemia Brother   . Stroke Brother   . ADD / ADHD Brother   . ADD / ADHD Son     BP (!) 160/86 (BP Location: Right Arm, Patient Position: Sitting, Cuff Size: Normal)   Pulse 92   Ht '5\' 11"'$  (1.803 m)   Wt 209 lb 3.2 oz (94.9 kg)   SpO2 97%   BMI 29.18 kg/m     Review of Systems denies weight loss, sob, n/v, and depression.     Objective:   Physical Exam VITAL SIGNS:  See vs page GENERAL: no distress Pulses: dorsalis pedis intact bilat.   MSK: no deformity of the feet CV: no leg edema Skin:  no ulcer on the feet.  normal color and temp on the feet. Neuro: sensation is intact to touch on the feet, but decreased from normal Ext: there is onychomycosis of the left great toenail    Lab Results  Component Value Date   CREATININE 1.22 11/22/2019   BUN 20 11/22/2019   NA 141 11/22/2019   K 5.5 (H) 11/22/2019   CL 106 11/22/2019   CO2 20 11/22/2019    Lab Results  Component Value Date   HGBA1C 7.3 (A) 01/08/2021   I have reviewed outside records, and summarized: Pt was noted to have elevated A1c, and referred here.  Stage 3 CRI was noted, and pt was advised to avoid NSAID      Assessment & Plan:  HTN: is noted today Type 2 DM, with PN Stage 3 CRI: he should phase out glimepiride and metformin, as glycemic control allows  Patient Instructions  Your blood pressure is high today.  Please see your primary care provider soon, to have it rechecked good diet and exercise significantly improve the  control of your diabetes.  please let me know if you wish to be referred to a dietician.  high blood sugar is very risky to your health.  you should see an eye doctor and dentist every year.  It is very important to get all recommended vaccinations.  Controlling your blood pressure and cholesterol drastically reduces the damage diabetes  does to your body.  Those who smoke should quit.  Please discuss these with your doctor.  check your blood sugar once a day.  vary the time of day when you check, between before the 3 meals, and at bedtime.  also check if you have symptoms of your blood sugar being too high or too low.  please keep a record of the readings and bring it to your next appointment here (or you can bring the meter itself).  You can write it on any piece of paper.  please call us sooner if your blood sugar goes below 70, or if most of your readings are over 200. We will need to take this complex situation in stages For now, please: Reduce the glimepiride to 1 mg each morning, and:  Please continue the same metformin, and: Increase the Trulicity to 3 mg weekly. Please come back for a follow-up appointment in 2 months.

## 2021-01-08 NOTE — Patient Instructions (Addendum)
Your blood pressure is high today.  Please see your primary care provider soon, to have it rechecked good diet and exercise significantly improve the control of your diabetes.  please let me know if you wish to be referred to a dietician.  high blood sugar is very risky to your health.  you should see an eye doctor and dentist every year.  It is very important to get all recommended vaccinations.  Controlling your blood pressure and cholesterol drastically reduces the damage diabetes does to your body.  Those who smoke should quit.  Please discuss these with your doctor.  check your blood sugar once a day.  vary the time of day when you check, between before the 3 meals, and at bedtime.  also check if you have symptoms of your blood sugar being too high or too low.  please keep a record of the readings and bring it to your next appointment here (or you can bring the meter itself).  You can write it on any piece of paper.  please call us sooner if your blood sugar goes below 70, or if most of your readings are over 200. We will need to take this complex situation in stages For now, please: Reduce the glimepiride to 1 mg each morning, and:  Please continue the same metformin, and: Increase the Trulicity to 3 mg weekly. Please come back for a follow-up appointment in 2 months.

## 2021-01-12 ENCOUNTER — Ambulatory Visit
Admission: RE | Admit: 2021-01-12 | Discharge: 2021-01-12 | Disposition: A | Discharge status: Home / Self Care | Payer: 59 | Source: Ambulatory Visit | Attending: Nephrology | Admitting: Nephrology

## 2021-01-12 DIAGNOSIS — N1831 Chronic kidney disease, stage 3a: Secondary | ICD-10-CM

## 2021-01-12 IMAGING — US US RENAL
1 series · 14 of 25 positions shown · non-contrast
Comparison: None.

CLINICAL DATA: CKD stage 3.

EXAM:
RENAL / URINARY TRACT ULTRASOUND COMPLETE

[Series 1: us renal · 0.25mm/px · 14 of 41 slices shown]
[im 1/41]
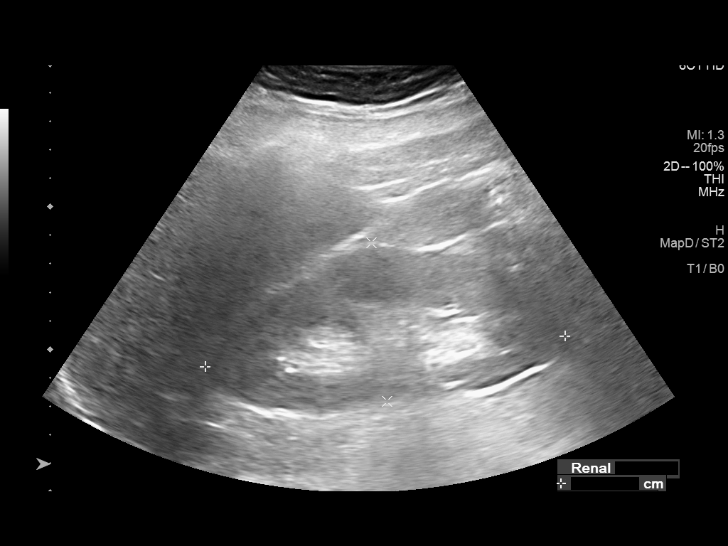
[im 4/41]
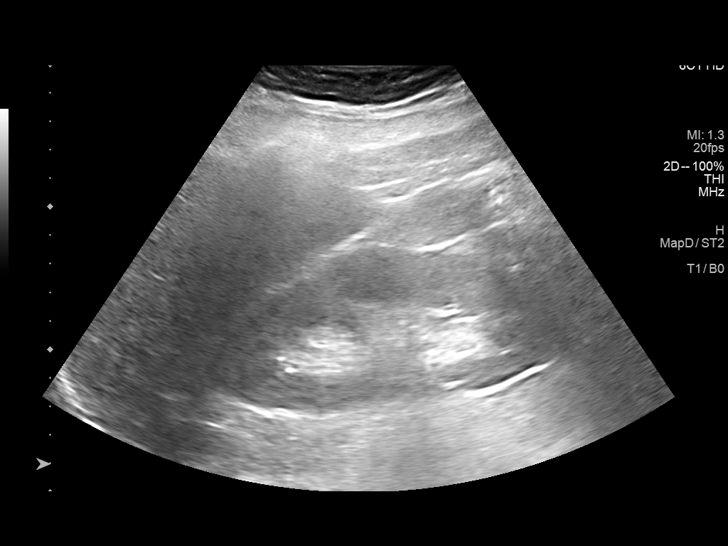
[im 7/41]
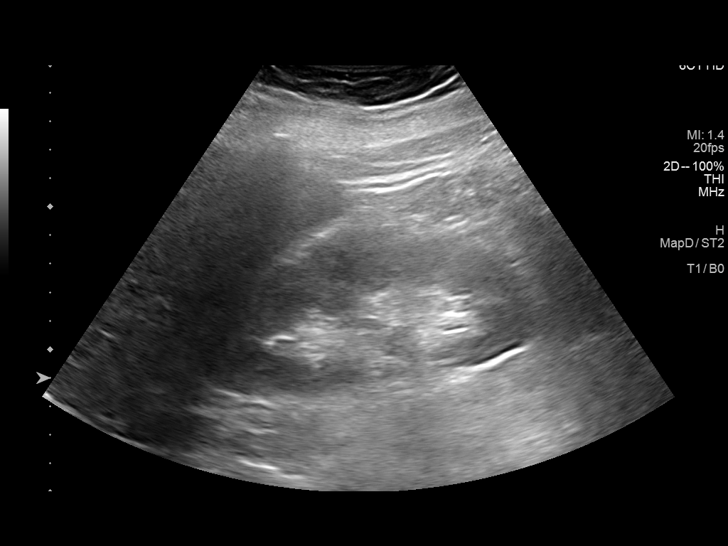
[im 11/41]
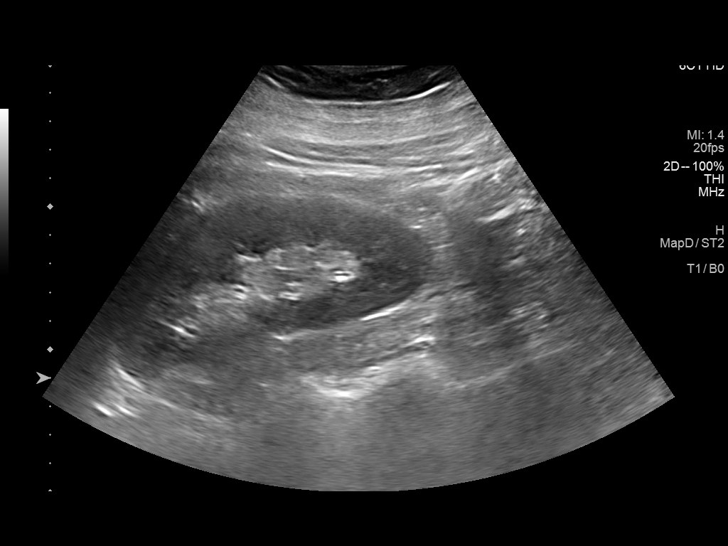
[im 14/41]
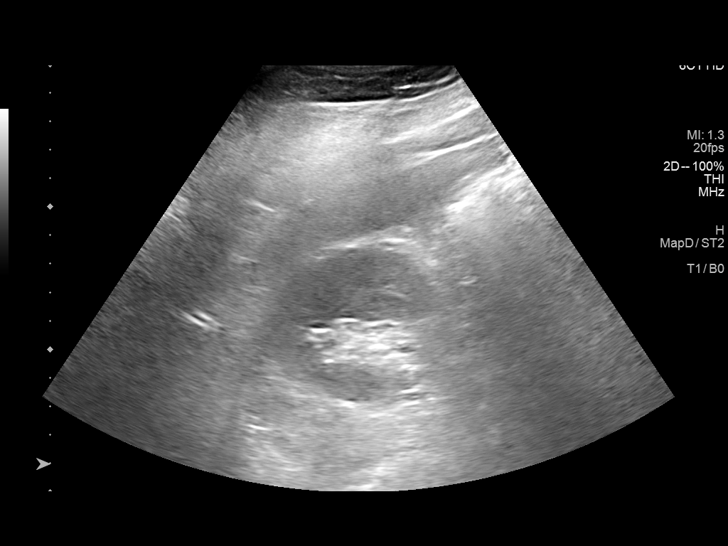
[im 16/41]
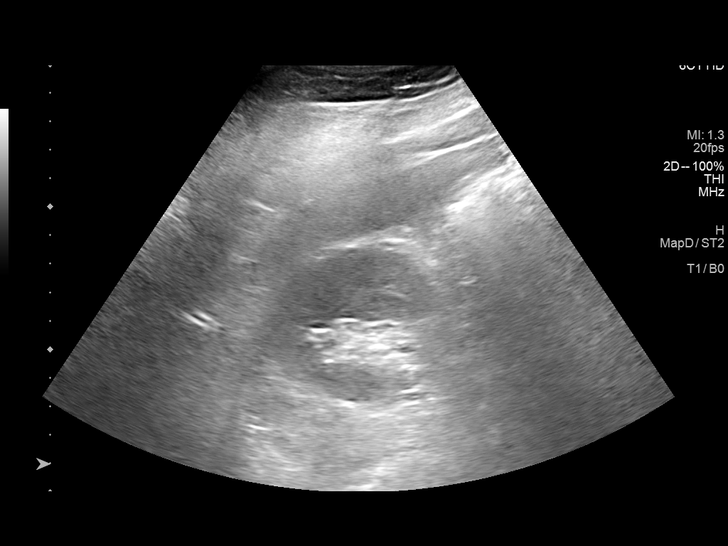
[im 19/41]
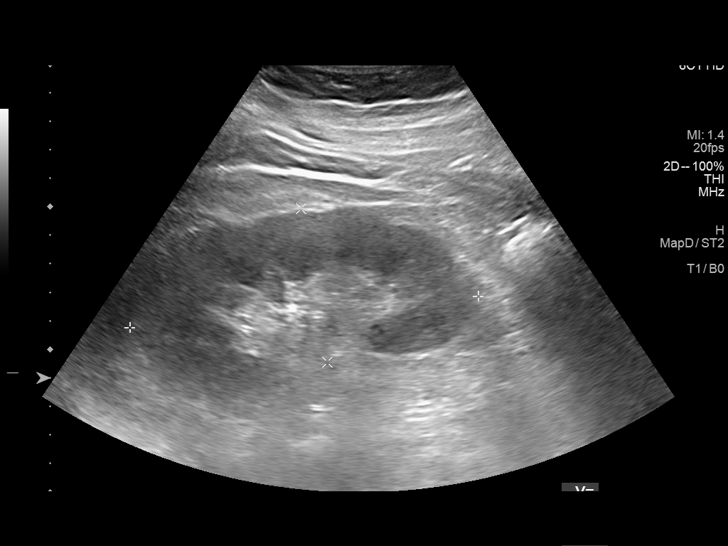
[im 22/41]
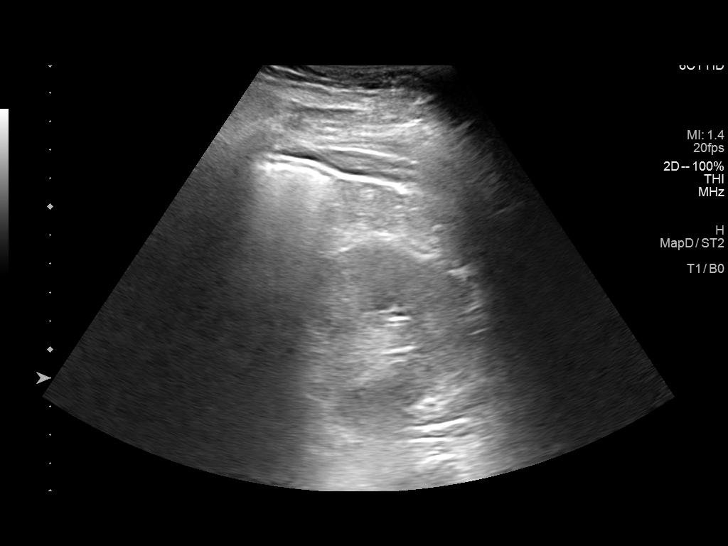
[im 26/41]
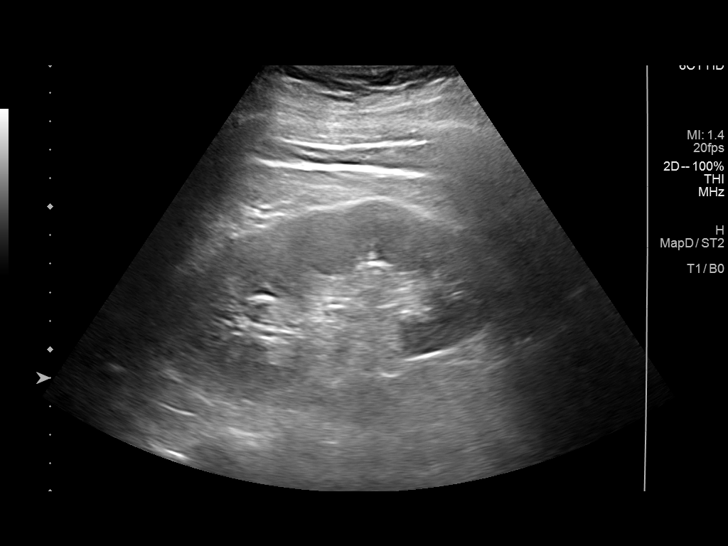
[im 27/41]
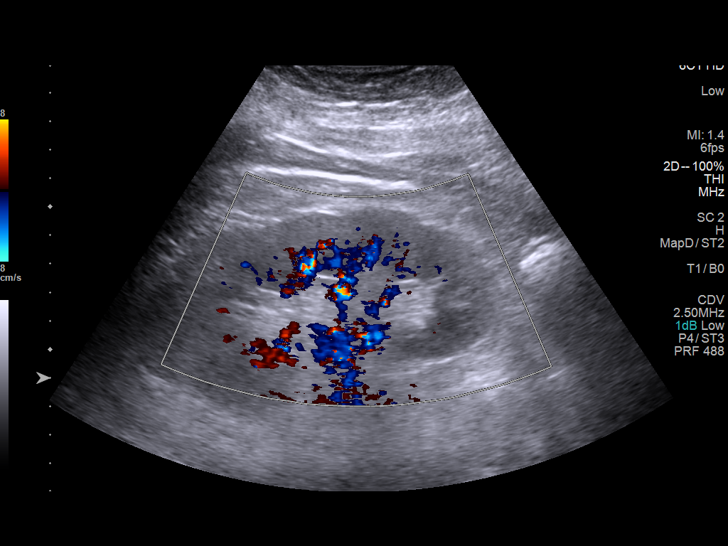
[im 31/41]
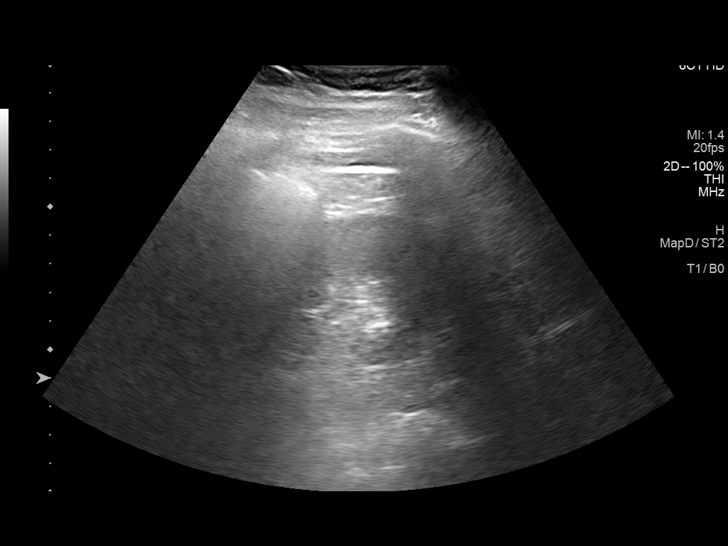
[im 34/41]
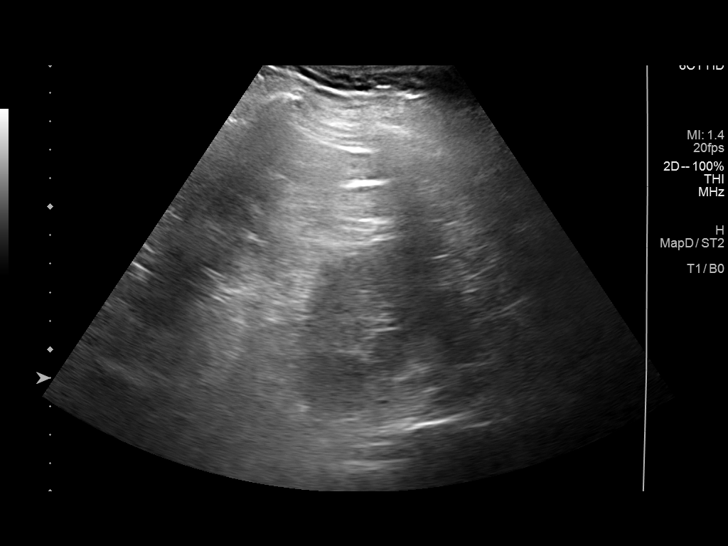
[im 37/41]
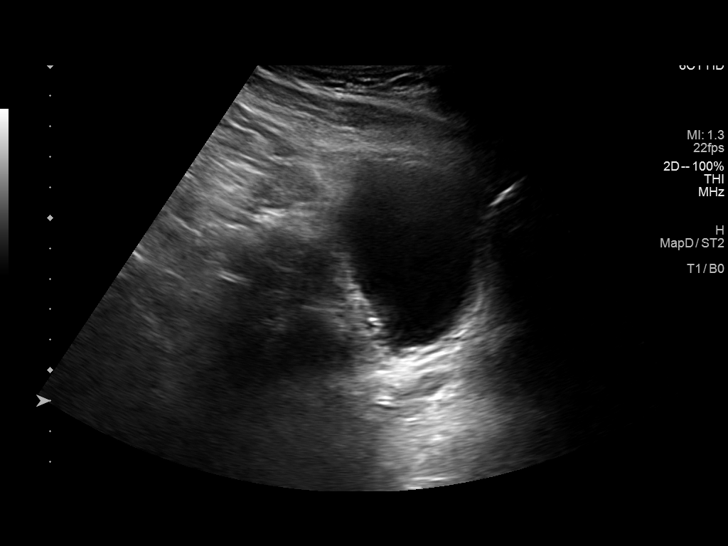
[im 41/41]
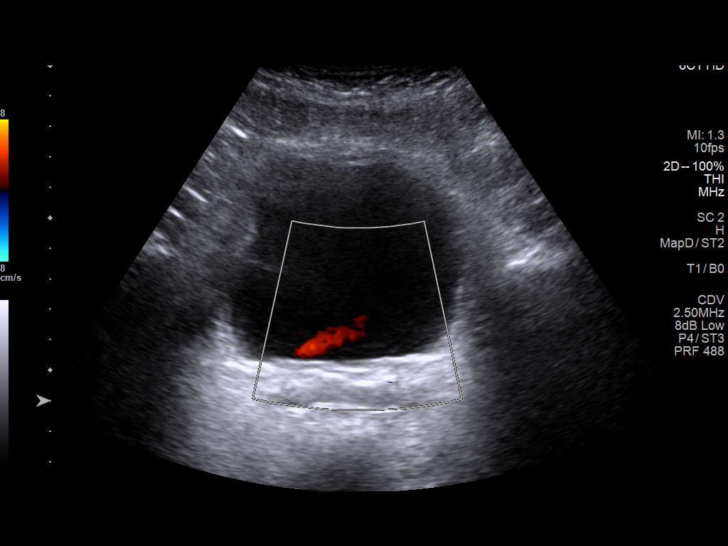

[14 of 25 positions shown; findings below may reference images not displayed]

FINDINGS: Right Kidney:

Renal measurements: 12.5 x 5.6 x 5.8 cm = volume: 209 mL.
Echogenicity within normal limits. No mass or hydronephrosis
visualized.

Left Kidney:

Renal measurements: 12.3 x 5.5 x 6.1 cm = volume: 212 mL.
Echogenicity within normal limits. No mass or hydronephrosis
visualized.

Bladder:

Appears normal for degree of bladder distention.

Other:

None.
IMPRESSION: No significant sonographic abnormality.

## 2021-01-21 ENCOUNTER — Ambulatory Visit: Payer: 59 | Admitting: Endocrinology

## 2021-01-29 ENCOUNTER — Telehealth: Payer: Self-pay

## 2021-01-29 NOTE — Telephone Encounter (Signed)
[facility] calling for OV notes from 01/08/2021 visit    Fax to [phone]

## 2021-01-31 NOTE — Telephone Encounter (Signed)
OV notes from 01/08/2021 Internal fax sent to [facility] @ [phone]

## 2021-03-05 ENCOUNTER — Telehealth (INDEPENDENT_AMBULATORY_CARE_PROVIDER_SITE_OTHER): Payer: 59 | Admitting: Psychiatry

## 2021-03-05 ENCOUNTER — Other Ambulatory Visit: Payer: Self-pay

## 2021-03-05 ENCOUNTER — Encounter (HOSPITAL_COMMUNITY): Payer: Self-pay | Admitting: Psychiatry

## 2021-03-05 DIAGNOSIS — F909 Attention-deficit hyperactivity disorder, unspecified type: Secondary | ICD-10-CM | POA: Diagnosis not present

## 2021-03-05 DIAGNOSIS — F411 Generalized anxiety disorder: Secondary | ICD-10-CM

## 2021-03-05 MED ORDER — DULOXETINE HCL 60 MG PO CPEP: ORAL_CAPSULE | ORAL | 2 refills | Status: DC

## 2021-03-05 MED ORDER — TRAZODONE HCL 100 MG PO TABS: ORAL_TABLET | ORAL | 0 refills | Status: DC

## 2021-03-05 MED ORDER — AMPHETAMINE-DEXTROAMPHETAMINE 20 MG PO TABS: 20.0000 mg | ORAL_TABLET | Freq: Two times a day (BID) | ORAL | 0 refills | Status: DC

## 2021-03-05 MED ORDER — ALPRAZOLAM 0.25 MG PO TABS: 0.2500 mg | ORAL_TABLET | Freq: Two times a day (BID) | ORAL | 2 refills | Status: AC | PRN

## 2021-03-05 NOTE — Progress Notes (Signed)
BH MD/PA/NP OP Progress Note  03/05/2021 2:13 PM Jose [name]  MRN:  [id number] Interview was conducted by phone and I verified that I was speaking with the correct person using two identifiers. I discussed the limitations of evaluation and management by telemedicine and  the availability of in person appointments. Patient expressed understanding and agreed to proceed. Participants in the visit: patient (location - home); physician (location - home office).  Chief Complaint:  anxiety around his health  HPI: 44yomarriedmale with a hx of anxiety, depression and adhd. He reports doing ok but was recently diagnosed with stage 3 kidney disease which has been stressful. 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 He reports taking the alprazolam twice daily in scheduled manner. Sleeping with help of trazodone and takes adderall to help focus. Denies suicidal thoughts.   Visit Diagnosis:    ICD-10-CM   1. GAD (generalized anxiety disorder)  F41.1   2. Adult ADHD  F90.9     Past Psychiatric History: Please see intake H&P.  Past Medical History:  Past Medical History:  Diagnosis Date  . GAD (generalized anxiety disorder)   . Hyperlipidemia   . Macular degeneration, bilateral   . Retinopathy   . Type II diabetes mellitus with complication, uncontrolled (HCC)    retinopathy, neuropathy, microalbuminuria    Past Surgical History:  Procedure Laterality Date  . APPENDECTOMY    . HERNIA REPAIR    . TRIGGER FINGER RELEASE Right 10/25/2019   Procedure: RIGHT INDEX FINGER RELEASE TRIGGER FINGER/A-1 PULLEY;  Surgeon: Daryll Brod, MD;  Location: [facility];  Service: Orthopedics;  Laterality: Right;  IV REGIONAL FOREARM BLOCK    Family Psychiatric History: Reviewed.  Family History:  Family History  Problem Relation Age of Onset  . Diabetes Mother   . Hyperlipidemia Mother   . Stroke Mother   . Diabetes Father   .  Hyperlipidemia Brother   . Stroke Brother   . ADD / ADHD Brother   . ADD / ADHD Son     Social History:  Social History   Socioeconomic History  . Marital status: Significant Other    Spouse name: Not on file  . Number of children: 2  . Years of education: Not on file  . Highest education level: Bachelor's degree (e.g., BA, AB, BS)  Occupational History  . 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 Smoking status: Current Every Day Smoker    Packs/day: 1.00    Years: 20.00    Pack years: 20.00    Types: Cigarettes  . Smokeless tobacco: Never Used  Vaping Use  . Vaping Use: Never used  Substance and Sexual Activity  . Alcohol use: Not Currently  . Drug use: Never  . Sexual activity: Yes    Partners: Female    Birth control/protection: None    Comment: with monogamous partner  Other Topics Concern  . Not on file  Social History Narrative   Pt has lived majority of life in [place]. Lives at home with partner, 2 kids, 8 cats, and 1 dog.    Social Determinants of Health   Financial Resource Strain: Not on file  Food Insecurity: Not on file  Transportation Needs: Not on file  Physical Activity: Not on file  Stress: Not on file  Social Connections: Not on file    Allergies: No Known Allergies  Metabolic Disorder Labs: Lab Results  Component Value Date   HGBA1C 7.3 (A) 01/08/2021   No results found for: PROLACTIN Lab Results  Component Value Date   CHOL 116 11/22/2019   TRIG 85 11/22/2019   HDL 42 11/22/2019   CHOLHDL 2.8 11/22/2019   LDLCALC 57 11/22/2019   LDLCALC 46 07/20/2019   Lab Results  Component Value Date   TSH 3.070 03/15/2019   TSH 1.990 12/12/2018    Therapeutic Level Labs: No results found for: LITHIUM No results found for: VALPROATE No components found for:  CBMZ  Current Medications: Current Outpatient Medications  Medication Sig Dispense Refill  . ALPRAZolam (XANAX) 0.25 MG tablet Take 1  tablet (0.25 mg total) by mouth 2 (two) times daily as needed for anxiety. 60 tablet 2  . amphetamine-dextroamphetamine (ADDERALL) 20 MG tablet Take 1 tablet (20 mg total) by mouth 2 (two) times daily. 60 tablet 0  . amphetamine-dextroamphetamine (ADDERALL) 20 MG tablet Take 1 tablet (20 mg total) by mouth 2 (two) times daily. 60 tablet 0  . amphetamine-dextroamphetamine (ADDERALL) 20 MG tablet Take 1 tablet (20 mg total) by mouth 2 (two) times daily. 60 tablet 0  . atorvastatin (LIPITOR) 40 MG tablet Take 1 tablet (40 mg total) by mouth daily. 90 tablet 3  . Dulaglutide (TRULICITY) 3 0000000 SOPN Inject 3 mg as directed once a week. 6 mL 3  . DULoxetine (CYMBALTA) 60 MG capsule TAKE 1 CAPSULE(60 MG) BY MOUTH DAILY 90 capsule 1  . Exenatide ER (BYDUREON BCISE) 2 MG/0.85ML AUIJ Inject 2 mg into the skin once a week. 4 pen 5  . gabapentin (NEURONTIN) 300 MG capsule TAKE 1 TO 2 CAPSULES(300 TO 600 MG) BY MOUTH AT BEDTIME 180 capsule 1  . glimepiride (AMARYL) 1 MG tablet Take 1 tablet (1 mg total) by mouth daily with breakfast. 90 tablet 3  . losartan (COZAAR) 50 MG tablet TAKE 1 TABLET(50 MG) BY MOUTH DAILY 30 tablet 0  . metFORMIN (GLUCOPHAGE-XR) 500 MG 24 hr tablet TAKE 4 TABLETS BY MOUTH DAILY BEFORE BREAKFAST 368 tablet 0  . Ranibizumab (LUCENTIS) 0.3 MG/0.05ML SOLN by Intravitreal route.     . tadalafil (CIALIS) 20 MG tablet TK 1 T PO  PO QD PRN FOR ERECTILE DYSFUNCTION 10 tablet 11  . tobramycin (TOBREX) 0.3 % ophthalmic solution INSTILL 1 DROP IN BOTH EYES 4 TIMES A DAY BEGIN 1 DAY  PRIOR TO TREATMENT CONTINUE THE DAY OF TREATMENT AND ONE FULL DAY AFTER TREATMENT  5  . traMADol (ULTRAM) 50 MG tablet Take 1 tablet (50 mg total) by mouth every 6 (six) hours as needed. 20 tablet 0  . traZODone (DESYREL) 100 MG tablet TAKE 1 TABLET(100 MG) BY MOUTH AT BEDTIME AS NEEDED FOR SLEEP 90 tablet 0   No current facility-administered medications for this visit.      Psychiatric Specialty Exam: Review  of Systems  Psychiatric/Behavioral: The patient is nervous/anxious.   All other systems reviewed and are negative.   There were no vitals taken for this visit.There is no height or weight on file to calculate BMI.  General Appearance: NA  Eye Contact:  NA  Speech:  Clear and Coherent and Normal Rate  Volume:  Normal  Mood:  Anxious  Affect:  NA  Thought Process:  Goal Directed and Linear  Orientation:  Full (Time, Place, and Person)  Thought Content: Logical   Suicidal Thoughts:  No  Homicidal Thoughts:  No  Memory:  Immediate;   Good Recent;   Good Remote;  Good  Judgement:  Good  Insight:  Good  Psychomotor Activity:  NA  Concentration:  Concentration: Good  Recall:  Good  Fund of Knowledge: Good  Language: Good  Akathisia:  Negative  Handed:  Right  AIMS (if indicated): not done  Assets:  Communication Skills Desire for Improvement Financial Resources/Insurance Housing Social Support Vocational/Educational  ADL's:  Intact  Cognition: WNL  Sleep:  Fair   Screenings: GAD-7   Flowsheet Row Office Visit from 05/10/2019 in Primary Care at [facility] from 04/12/2019 in Primary Care at [facility]  Total GAD-7 Score 11 19    PHQ2-9   [place] Visit from 11/22/2019 in Primary Care at [facility] from 07/20/2019 in [place] at [facility] from 05/14/2019 in [place] at [facility] from 05/10/2019 in [place] at [facility] from 04/12/2019 in [place] at [facility]  PHQ-2 Total Score 0 0 0 0 2  PHQ-9 Total Score -- -- -- -- 11       Assessment and Plan: 44yomarriedmale with a hx of anxiety and some depression who has been troughout his life struggling with problems focusing, loosing interest, task completion as well as excessive worrying and occasional panic attacks and depression secondary to his focusing problems. These have impinged on his ability to function effectively at work (but also at home) and in the past  he has lost two jobs because of performance deficiencies (struggled with writing proposals). 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 He has no component of hyperactivity. Stefanie reports in addition to ADD symptoms also initial and middle insomnia, racing thoughts, excessive worrying and occasional panic attacks. He denies feeling hopeless, helpless, suicidal. His appetite is normal.We continued duloxetine, added trazodone for insomnia and Adderall20 mg bid.It works well and his concentration improved.Sleep is better as well and he does not need to use trazodone often.Recent increase in work load/responsibility on the job breeds some anxiety. He is prescribed low dose of alprazolam and finds it helpful for anxiety prn . He has macular degeneration and has just undergone surgery on one eye (next month on the other eye).  Dx: GAD, ADHD adult  Plan: Continue duloxetine 60 mg,trazodone 100 mg prn sleep, Adderall'20mg'$  bid for ADD and alprazolam 0.25 mg prn anxiety.We discussed the addiction potential of benzodiazepines when taken daily for more than 3 weeks, patient not willing to taper at this time. Consider taper in future when his health is stable and start other evidence based treatment options. Next appointment inthree months.The plan was discussed with patient who had an opportunity to ask questions and these were all answered. I spend47mnutes inphone consultationwith the patient.   HElvin So MD 03/05/2021, 2:13 PM

## 2021-03-10 ENCOUNTER — Ambulatory Visit (INDEPENDENT_AMBULATORY_CARE_PROVIDER_SITE_OTHER): Payer: 59 | Admitting: Endocrinology

## 2021-03-10 ENCOUNTER — Other Ambulatory Visit: Payer: Self-pay

## 2021-03-10 VITALS — BP 172/94 | HR 92 | Ht 71.0 in | Wt 201.2 lb

## 2021-03-10 DIAGNOSIS — E1142 Type 2 diabetes mellitus with diabetic polyneuropathy: Secondary | ICD-10-CM | POA: Diagnosis not present

## 2021-03-10 DIAGNOSIS — E119 Type 2 diabetes mellitus without complications: Secondary | ICD-10-CM

## 2021-03-10 LAB — POCT GLYCOSYLATED HEMOGLOBIN (HGB A1C): Hemoglobin A1C: 7 % — AB (ref 4.0–5.6)

## 2021-03-10 MED ORDER — METFORMIN HCL ER 500 MG PO TB24: 1000.0000 mg | ORAL_TABLET | Freq: Every day | ORAL | 3 refills | Status: DC

## 2021-03-10 NOTE — Progress Notes (Signed)
Subjective:    Patient ID: Jose [name], male    DOB: 06/23/1976, 45 y.o.   MRN: [id number]  HPI Pt returns for f/u of diabetes mellitus: DM type: 2 Dx'ed: Trulicity and 2 oral meds Complications: stage 3 CRI and PN Therapy: insulin since DKA: never Severe hypoglycemia: never Pancreatitis: never Pancreatic imaging: never SDOH:  Other: he has never been on insulin Interval history: pt states he feels well in general.  no cbg record, but states cbg's vary from 80-175.   Past Medical History:  Diagnosis Date  . GAD (generalized anxiety disorder)   . Hyperlipidemia   . Macular degeneration, bilateral   . Retinopathy   . Type II diabetes mellitus with complication, uncontrolled (HCC)    retinopathy, neuropathy, microalbuminuria    Past Surgical History:  Procedure Laterality Date  . APPENDECTOMY    . HERNIA REPAIR    . TRIGGER FINGER RELEASE Right 10/25/2019   Procedure: RIGHT INDEX FINGER RELEASE TRIGGER FINGER/A-1 PULLEY;  Surgeon: Daryll Brod, MD;  Location: [facility];  Service: Orthopedics;  Laterality: Right;  IV REGIONAL FOREARM BLOCK    Social History   Socioeconomic History  . Marital status: Significant Other    Spouse name: Not on file  . Number of children: 2  . Years of education: Not on file  . Highest education level: Bachelor's degree (e.g., BA, AB, BS)  Occupational History  . Occupation: Multimedia programmer: [employer]  Tobacco Use  . Smoking status: Current Every Day Smoker    Packs/day: 1.00    Years: 20.00    Pack years: 20.00    Types: Cigarettes  . Smokeless tobacco: Never Used  Vaping Use  . Vaping Use: Never used  Substance and Sexual Activity  . Alcohol use: Not Currently  . Drug use: Never  . Sexual activity: Yes    Partners: Female    Birth control/protection: None    Comment: with monogamous partner  Other Topics Concern  . Not on file  Social History Narrative   Pt has lived majority of  life in [place]. Lives at home with partner, 2 kids, 8 cats, and 1 dog.    Social Determinants of Health   Financial Resource Strain: Not on file  Food Insecurity: Not on file  Transportation Needs: Not on file  Physical Activity: Not on file  Stress: Not on file  Social Connections: Not on file  Intimate Partner Violence: Not on file    Current Outpatient Medications on File Prior to Visit  Medication Sig Dispense Refill  . ALPRAZolam (XANAX) 0.25 MG tablet Take 1 tablet (0.25 mg total) by mouth 2 (two) times daily as needed for anxiety. 60 tablet 2  . amphetamine-dextroamphetamine (ADDERALL) 20 MG tablet Take 1 tablet (20 mg total) by mouth 2 (two) times daily. 60 tablet 0  . amphetamine-dextroamphetamine (ADDERALL) 20 MG tablet Take 1 tablet (20 mg total) by mouth 2 (two) times daily. 60 tablet 0  . amphetamine-dextroamphetamine (ADDERALL) 20 MG tablet Take 1 tablet (20 mg total) by mouth 2 (two) times daily. 60 tablet 0  . atorvastatin (LIPITOR) 40 MG tablet Take 1 tablet (40 mg total) by mouth daily. 90 tablet 3  . Dulaglutide (TRULICITY) 3 0000000 SOPN Inject 3 mg as directed once a week. 6 mL 3  . DULoxetine (CYMBALTA) 60 MG capsule TAKE 1 CAPSULE(60 MG) BY MOUTH DAILY 30 capsule 2  . gabapentin (NEURONTIN) 300 MG capsule TAKE 1 TO  2 CAPSULES(300 TO 600 MG) BY MOUTH AT BEDTIME 180 capsule 1  . glimepiride (AMARYL) 1 MG tablet Take 1 tablet (1 mg total) by mouth daily with breakfast. 90 tablet 3  . losartan (COZAAR) 50 MG tablet TAKE 1 TABLET(50 MG) BY MOUTH DAILY 30 tablet 0  . Ranibizumab (LUCENTIS) 0.3 MG/0.05ML SOLN by Intravitreal route.     . tadalafil (CIALIS) 20 MG tablet TK 1 T PO  PO QD PRN FOR ERECTILE DYSFUNCTION 10 tablet 11  . tobramycin (TOBREX) 0.3 % ophthalmic solution INSTILL 1 DROP IN BOTH EYES 4 TIMES A DAY BEGIN 1 DAY  PRIOR TO TREATMENT CONTINUE THE DAY OF TREATMENT AND ONE FULL DAY AFTER TREATMENT  5  . traMADol (ULTRAM) 50 MG tablet Take 1 tablet (50 mg  total) by mouth every 6 (six) hours as needed. 20 tablet 0  . traZODone (DESYREL) 100 MG tablet TAKE 1 TABLET(100 MG) BY MOUTH AT BEDTIME AS NEEDED FOR SLEEP 90 tablet 0   No current facility-administered medications on file prior to visit.    No Known Allergies  Family History  Problem Relation Age of Onset  . Diabetes Mother   . Hyperlipidemia Mother   . Stroke Mother   . Diabetes Father   . Hyperlipidemia Brother   . Stroke Brother   . ADD / ADHD Brother   . ADD / ADHD Son     BP (!) 172/94 (BP Location: Right Arm, Patient Position: Sitting, Cuff Size: Normal)   Pulse 92   Ht '5\' 11"'$  (1.803 m)   Wt 201 lb 3.2 oz (91.3 kg)   SpO2 97%   BMI 28.06 kg/m   Review of Systems He has intermitt nausea.      Objective:   Physical Exam VITAL SIGNS:  See vs page GENERAL: no distress Pulses: dorsalis pedis intact bilat.   MSK: no deformity of the feet CV: no leg edema Skin:  no ulcer on the feet.  normal color and temp on the feet.  Neuro: sensation is intact to touch on the feet.  Ext: left 1-2 toenails are absent (recent contusion).  Also abrasion on the plantar aspect of the left great toe.     Lab Results  Component Value Date   HGBA1C 7.0 (A) 03/10/2021   Lab Results  Component Value Date   CREATININE 1.22 11/22/2019   BUN 20 11/22/2019   NA 141 11/22/2019   K 5.5 (H) 11/22/2019   CL 106 11/22/2019   CO2 20 11/22/2019       Assessment & Plan:  Type 2 DM: well-controlled.   Foot abrasion, new. Nausea, due to medication.   Patient Instructions  Your blood pressure is high today.  Please see your primary care provider soon, to have it rechecked  check your blood sugar once a day.  vary the time of day when you check, between before the 3 meals, and at bedtime.  also check if you have symptoms of your blood sugar being too high or too low.  please keep a record of the readings and bring it to your next appointment here (or you can bring the meter itself).  You  can write it on any piece of paper.  please call us sooner if your blood sugar goes below 70, or if most of your readings are over 200. We will need to take this complex situation in stages.  Please reduce the metformin to 2 pills per day.  Keep the scrape covered with antibiotic  ointment and large bandaids, until it heals.  Please call next week if it is not much better by then. Please come back for a follow-up appointment in 2 months.

## 2021-03-10 NOTE — Patient Instructions (Addendum)
Your blood pressure is high today.  Please see your primary care provider soon, to have it rechecked  check your blood sugar once a day.  vary the time of day when you check, between before the 3 meals, and at bedtime.  also check if you have symptoms of your blood sugar being too high or too low.  please keep a record of the readings and bring it to your next appointment here (or you can bring the meter itself).  You can write it on any piece of paper.  please call us sooner if your blood sugar goes below 70, or if most of your readings are over 200. We will need to take this complex situation in stages.  Please reduce the metformin to 2 pills per day.  Keep the scrape covered with antibiotic ointment and large bandaids, until it heals.  Please call next week if it is not much better by then. Please come back for a follow-up appointment in 2 months.

## 2021-03-25 ENCOUNTER — Telehealth (HOSPITAL_COMMUNITY): Payer: Self-pay | Admitting: *Deleted

## 2021-03-25 NOTE — Telephone Encounter (Signed)
Placed call to patient to inform of cancelled appointment.  Discussed need for patient to secure another provider and gave information regarding the resource letter that has been mailed.  Patient does not need refills. Informed patient about [facility] in case of emergency.

## 2021-03-27 ENCOUNTER — Other Ambulatory Visit: Payer: Self-pay

## 2021-03-27 ENCOUNTER — Ambulatory Visit (INDEPENDENT_AMBULATORY_CARE_PROVIDER_SITE_OTHER): Payer: 59 | Admitting: Podiatry

## 2021-03-27 DIAGNOSIS — L97511 Non-pressure chronic ulcer of other part of right foot limited to breakdown of skin: Secondary | ICD-10-CM

## 2021-03-27 DIAGNOSIS — E11621 Type 2 diabetes mellitus with foot ulcer: Secondary | ICD-10-CM

## 2021-03-27 DIAGNOSIS — L97522 Non-pressure chronic ulcer of other part of left foot with fat layer exposed: Secondary | ICD-10-CM | POA: Diagnosis not present

## 2021-03-31 ENCOUNTER — Encounter: Payer: Self-pay | Admitting: Podiatry

## 2021-03-31 NOTE — Progress Notes (Signed)
Subjective:  Patient ID: Jose [name], male    DOB: Jun 22, 1976,  MRN: [id number]  Chief Complaint  Patient presents with  . Wound Check    Bilateral hallux wounds  Right hallux has bleeding     45 y.o. male presents for wound care.  Patient presents with complaint bilateral hallux wound.  Patient is a diabetic with last A1c of 7.0.  Patient states is painful somewhat.  He is mostly neuropathy to both lower extremity.  He states his wound opened up out of nowhere.  He states that one of them was bleeding.  He would like to get it evaluated.  He was sent here as an urgent referral from [facility].He denies any nausea fever chills vomiting.  He denies doing anything for the wound.  He denies any other acute complaints   Review of Systems: Negative except as noted in the HPI. Denies N/V/F/Ch.  Past Medical History:  Diagnosis Date  . GAD (generalized anxiety disorder)   . Hyperlipidemia   . Macular degeneration, bilateral   . Retinopathy   . Type II diabetes mellitus with complication, uncontrolled (HCC)    retinopathy, neuropathy, microalbuminuria    Current Outpatient Medications:  .  ALPRAZolam (XANAX) 0.25 MG tablet, Take 1 tablet (0.25 mg total) by mouth 2 (two) times daily as needed for anxiety., Disp: 60 tablet, Rfl: 2 .  amphetamine-dextroamphetamine (ADDERALL) 20 MG tablet, Take 1 tablet (20 mg total) by mouth 2 (two) times daily., Disp: 60 tablet, Rfl: 0 .  amphetamine-dextroamphetamine (ADDERALL) 20 MG tablet, Take 1 tablet (20 mg total) by mouth 2 (two) times daily., Disp: 60 tablet, Rfl: 0 .  amphetamine-dextroamphetamine (ADDERALL) 20 MG tablet, Take 1 tablet (20 mg total) by mouth 2 (two) times daily., Disp: 60 tablet, Rfl: 0 .  atorvastatin (LIPITOR) 40 MG tablet, Take 1 tablet (40 mg total) by mouth daily., Disp: 90 tablet, Rfl: 3 .  Dulaglutide (TRULICITY) 3 0000000 SOPN, Inject 3 mg as directed once a week., Disp: 6 mL, Rfl: 3 .  DULoxetine (CYMBALTA) 60 MG  capsule, TAKE 1 CAPSULE(60 MG) BY MOUTH DAILY, Disp: 30 capsule, Rfl: 2 .  gabapentin (NEURONTIN) 300 MG capsule, TAKE 1 TO 2 CAPSULES(300 TO 600 MG) BY MOUTH AT BEDTIME, Disp: 180 capsule, Rfl: 1 .  glimepiride (AMARYL) 1 MG tablet, Take 1 tablet (1 mg total) by mouth daily with breakfast., Disp: 90 tablet, Rfl: 3 .  losartan (COZAAR) 50 MG tablet, TAKE 1 TABLET(50 MG) BY MOUTH DAILY, Disp: 30 tablet, Rfl: 0 .  metFORMIN (GLUCOPHAGE-XR) 500 MG 24 hr tablet, Take 2 tablets (1,000 mg total) by mouth daily., Disp: 180 tablet, Rfl: 3 .  Ranibizumab (LUCENTIS) 0.3 MG/0.05ML SOLN, by Intravitreal route. , Disp: , Rfl:  .  tadalafil (CIALIS) 20 MG tablet, TK 1 T PO  PO QD PRN FOR ERECTILE DYSFUNCTION, Disp: 10 tablet, Rfl: 11 .  tobramycin (TOBREX) 0.3 % ophthalmic solution, INSTILL 1 DROP IN BOTH EYES 4 TIMES A DAY BEGIN 1 DAY  PRIOR TO TREATMENT CONTINUE THE DAY OF TREATMENT AND ONE FULL DAY AFTER TREATMENT, Disp: , Rfl: 5 .  traMADol (ULTRAM) 50 MG tablet, Take 1 tablet (50 mg total) by mouth every 6 (six) hours as needed., Disp: 20 tablet, Rfl: 0 .  traZODone (DESYREL) 100 MG tablet, TAKE 1 TABLET(100 MG) BY MOUTH AT BEDTIME AS NEEDED FOR SLEEP, Disp: 90 tablet, Rfl: 0  Social History   Tobacco Use  Smoking Status Current Every Day Smoker  .  Packs/day: 1.00  . Years: 20.00  . Pack years: 20.00  . Types: Cigarettes  Smokeless Tobacco Never Used    No Known Allergies Objective:  There were no vitals filed for this visit. There is no height or weight on file to calculate BMI. Constitutional Well developed. Well nourished.  Vascular Dorsalis pedis pulses failty palpable bilaterally. Posterior tibial pulses faintly palpable bilaterally. Capillary refill normal to all digits.  No cyanosis or clubbing noted. Pedal hair growth normal.  Neurologic Normal speech. Oriented to person, place, and time. Protective sensation absent  Dermatologic Wound Location:  bilateral hallux wound wit limited  to the breakdown of the skin.  No clinical signs of infection noted.  No malodor present.  No purulent drainage noted. Wound Base: Mixed Granular/Fibrotic Peri-wound: Calloused Exudate: Scant/small amount Serosanguinous exudate Wound Measurements: -See below  Orthopedic: No pain to palpation either foot.   Radiographs: None Assessment:   1. Diabetic ulcer of toe of right foot associated with type 2 diabetes mellitus, limited to breakdown of skin ([place])   2. Diabetic ulcer of toe of left foot associated with type 2 diabetes mellitus, with fat layer exposed ([place])    Plan:  Patient was evaluated and treated and all questions answered.  Ulcer bilateral hallux with limited to the breakdown of the skin -Debridement as below. -Dressed with Betadine wet-to-dry, DSD. -Continue off-loading with surgical shoe.  Bilateral -Given the chronic nature of this wound and since has been present for quite some time.  I believe patient will benefit from graft application.  I will work on obtaining graft approval.  Procedure: Excisional Debridement of Wound right hallux Tool: Sharp chisel blade/tissue nipper Rationale: Removal of non-viable soft tissue from the wound to promote healing.  Anesthesia: none Pre-Debridement Wound Measurements: 1 cm x 1.5 cm x 0.2 cm  Post-Debridement Wound Measurements: 1.1 cm x 1.7 cm x 0.2 cm  Type of Debridement: Sharp Excisional Tissue Removed: Non-viable soft tissue Blood loss: Minimal (<50cc) Depth of Debridement: subcutaneous tissue. Technique: Sharp excisional debridement to bleeding, viable wound base.  Wound Progress: This is my initial evaluation of continue monitor the progression of the wound Site healing conversation 7 Dressing: Dry, sterile, compression dressing. Disposition: Patient tolerated procedure well. Patient to return in 1 week for follow-up.  Procedure: Excisional Debridement of Wound left hallux Tool: Sharp chisel blade/tissue  nipper Rationale: Removal of non-viable soft tissue from the wound to promote healing.  Anesthesia: none Pre-Debridement Wound Measurements: 1 cm x 1.6 cm x 0.2 cm  Post-Debridement Wound Measurements: 1.1 cm x 1.8 cm x 0.2 cm  Type of Debridement: Sharp Excisional Tissue Removed: Non-viable soft tissue Blood loss: Minimal (<50cc) Depth of Debridement: subcutaneous tissue. Technique: Sharp excisional debridement to bleeding, viable wound base.  Wound Progress: This is my initial evaluation I will continue monitor the progression of the wound. Site healing conversation 7 Dressing: Dry, sterile, compression dressing. Disposition: Patient tolerated procedure well. Patient to return in 1 week for follow-up.  No follow-ups on file.  No follow-ups on file.

## 2021-04-10 ENCOUNTER — Ambulatory Visit (INDEPENDENT_AMBULATORY_CARE_PROVIDER_SITE_OTHER): Payer: 59 | Admitting: Podiatry

## 2021-04-10 ENCOUNTER — Other Ambulatory Visit: Payer: Self-pay

## 2021-04-10 DIAGNOSIS — E11621 Type 2 diabetes mellitus with foot ulcer: Secondary | ICD-10-CM

## 2021-04-10 DIAGNOSIS — L97512 Non-pressure chronic ulcer of other part of right foot with fat layer exposed: Secondary | ICD-10-CM | POA: Diagnosis not present

## 2021-04-10 DIAGNOSIS — L97522 Non-pressure chronic ulcer of other part of left foot with fat layer exposed: Secondary | ICD-10-CM

## 2021-04-15 ENCOUNTER — Ambulatory Visit (INDEPENDENT_AMBULATORY_CARE_PROVIDER_SITE_OTHER): Payer: 59 | Admitting: Podiatry

## 2021-04-15 ENCOUNTER — Other Ambulatory Visit: Payer: Self-pay

## 2021-04-15 ENCOUNTER — Encounter: Payer: Self-pay | Admitting: Podiatry

## 2021-04-15 DIAGNOSIS — E11621 Type 2 diabetes mellitus with foot ulcer: Secondary | ICD-10-CM | POA: Diagnosis not present

## 2021-04-15 DIAGNOSIS — L97512 Non-pressure chronic ulcer of other part of right foot with fat layer exposed: Secondary | ICD-10-CM

## 2021-04-15 DIAGNOSIS — L97522 Non-pressure chronic ulcer of other part of left foot with fat layer exposed: Secondary | ICD-10-CM | POA: Diagnosis not present

## 2021-04-15 NOTE — Progress Notes (Signed)
Subjective:  Patient ID: Jose [name], male    DOB: 1976-08-12,  MRN: [id number]  Chief Complaint  Patient presents with   Wound Check    Wound check     45 y.o. male presents for wound care.  Patient presents with follow-up of bilateral hallux wound.  Patient is here to be have graft application done to help close the wound as this has been greater than 4 weeks of dealing with the wound.  Patient is a diabetic.   Review of Systems: Negative except as noted in the HPI. Denies N/V/F/Ch.  Past Medical History:  Diagnosis Date   GAD (generalized anxiety disorder)    Hyperlipidemia    Macular degeneration, bilateral    Retinopathy    Type II diabetes mellitus with complication, uncontrolled (HCC)    retinopathy, neuropathy, microalbuminuria    Current Outpatient Medications:    ALPRAZolam (XANAX) 0.25 MG tablet, Take 1 tablet (0.25 mg total) by mouth 2 (two) times daily as needed for anxiety., Disp: 60 tablet, Rfl: 2   amphetamine-dextroamphetamine (ADDERALL) 20 MG tablet, Take 1 tablet (20 mg total) by mouth 2 (two) times daily., Disp: 60 tablet, Rfl: 0   amphetamine-dextroamphetamine (ADDERALL) 20 MG tablet, Take 1 tablet (20 mg total) by mouth 2 (two) times daily., Disp: 60 tablet, Rfl: 0   amphetamine-dextroamphetamine (ADDERALL) 20 MG tablet, Take 1 tablet (20 mg total) by mouth 2 (two) times daily., Disp: 60 tablet, Rfl: 0   atorvastatin (LIPITOR) 40 MG tablet, Take 1 tablet (40 mg total) by mouth daily., Disp: 90 tablet, Rfl: 3   Dulaglutide (TRULICITY) 3 0000000 SOPN, Inject 3 mg as directed once a week., Disp: 6 mL, Rfl: 3   DULoxetine (CYMBALTA) 60 MG capsule, TAKE 1 CAPSULE(60 MG) BY MOUTH DAILY, Disp: 30 capsule, Rfl: 2   gabapentin (NEURONTIN) 300 MG capsule, TAKE 1 TO 2 CAPSULES(300 TO 600 MG) BY MOUTH AT BEDTIME, Disp: 180 capsule, Rfl: 1   glimepiride (AMARYL) 1 MG tablet, Take 1 tablet (1 mg total) by mouth daily with breakfast., Disp: 90 tablet, Rfl: 3   losartan  (COZAAR) 50 MG tablet, TAKE 1 TABLET(50 MG) BY MOUTH DAILY, Disp: 30 tablet, Rfl: 0   metFORMIN (GLUCOPHAGE-XR) 500 MG 24 hr tablet, Take 2 tablets (1,000 mg total) by mouth daily., Disp: 180 tablet, Rfl: 3   Ranibizumab (LUCENTIS) 0.3 MG/0.05ML SOLN, by Intravitreal route. , Disp: , Rfl:    tadalafil (CIALIS) 20 MG tablet, TK 1 T PO  PO QD PRN FOR ERECTILE DYSFUNCTION, Disp: 10 tablet, Rfl: 11   tobramycin (TOBREX) 0.3 % ophthalmic solution, INSTILL 1 DROP IN BOTH EYES 4 TIMES A DAY BEGIN 1 DAY  PRIOR TO TREATMENT CONTINUE THE DAY OF TREATMENT AND ONE FULL DAY AFTER TREATMENT, Disp: , Rfl: 5   traMADol (ULTRAM) 50 MG tablet, Take 1 tablet (50 mg total) by mouth every 6 (six) hours as needed., Disp: 20 tablet, Rfl: 0   traZODone (DESYREL) 100 MG tablet, TAKE 1 TABLET(100 MG) BY MOUTH AT BEDTIME AS NEEDED FOR SLEEP, Disp: 90 tablet, Rfl: 0  Social History   Tobacco Use  Smoking Status Every Day   Packs/day: 1.00   Years: 20.00   Pack years: 20.00   Types: Cigarettes  Smokeless Tobacco Never    No Known Allergies Objective:  There were no vitals filed for this visit. There is no height or weight on file to calculate BMI. Constitutional Well developed. Well nourished.  Vascular Dorsalis pedis pulses failty palpable  bilaterally. Posterior tibial pulses faintly palpable bilaterally. Capillary refill normal to all digits.  No cyanosis or clubbing noted. Pedal hair growth normal.  Neurologic Normal speech. Oriented to person, place, and time. Protective sensation absent  Dermatologic Wound Location:  bilateral hallux wound wit limited to the breakdown of the skin.  No clinical signs of infection noted.  No malodor present.  No purulent drainage noted. Wound Base: Mixed Granular/Fibrotic Peri-wound: Calloused Exudate: Scant/small amount Serosanguinous exudate Wound Measurements: -See below  Orthopedic: No pain to palpation either foot.   Radiographs: None Assessment:   1. Diabetic  ulcer of toe of right foot associated with type 2 diabetes mellitus, with fat layer exposed ([place])   2. Diabetic ulcer of toe of left foot associated with type 2 diabetes mellitus, with fat layer exposed ([place])     Plan:  Patient was evaluated and treated and all questions answered.  Ulcer bilateral hallux with limited to the breakdown of the skin -Debridement as below. -Dressed with Betadine wet-to-dry, DSD. -Continue off-loading with surgical shoe.  Bilateral -Given the chronic nature of this wound and since has been present for quite some time.  We will continue graft application and to resolve meant of the wound.  Application of synthetic skin graft/substitute  Name: Organ[employer] Apligraf Lot #[id number] expiration date 2021-04-14 Usage: Apiligraft was applied directly to the listed above wound site and secured with Adaptic, kerlix, Ace bandage. Waste: No graft material was wasted and was applied in its entirety.   Procedure: Excisional Debridement of Wound right hallux Tool: Sharp chisel blade/tissue nipper Rationale: Removal of non-viable soft tissue from the wound to promote healing.  Anesthesia: none Pre-Debridement Wound Measurements: 1.5 cm x 0.5 cm x 0.3 cm Post-Debridement Wound Measurements: 1.5 cm x 0.5 cm x 0.3 cm Type of Debridement: Sharp Excisional Tissue Removed: Non-viable soft tissue Blood loss: Minimal (<50cc) Depth of Debridement: subcutaneous tissue. Technique: Sharp excisional debridement to bleeding, viable wound base.  Wound Progress: This is my initial evaluation of continue monitor the progression of the wound Site healing conversation 7 Dressing: Dry, sterile, compression dressing. Disposition: Patient tolerated procedure well. Patient to return in 1 week for follow-up.  Procedure: Excisional Debridement of Wound left hallux Tool: Sharp chisel blade/tissue nipper Rationale: Removal of non-viable soft tissue from the wound to promote healing.   Anesthesia: none Pre-Debridement Wound Measurements: 1 cm x 1.0 cm x 0.2 cm  Post-Debridement Wound Measurements: 1 cm x 1.0 cm x 0.2 cm  Type of Debridement: Sharp Excisional Tissue Removed: Non-viable soft tissue Blood loss: Minimal (<50cc) Depth of Debridement: subcutaneous tissue. Technique: Sharp excisional debridement to bleeding, viable wound base.  Wound Progress: This is my initial evaluation I will continue monitor the progression of the wound. Site healing conversation 7 Dressing: Dry, sterile, compression dressing. Disposition: Patient tolerated procedure well. Patient to return in 1 week for follow-up.  No follow-ups on file.  No follow-ups on file.

## 2021-04-21 ENCOUNTER — Encounter: Payer: Self-pay | Admitting: Podiatry

## 2021-04-21 NOTE — Progress Notes (Signed)
Subjective:  Patient ID: Jose [name], male    DOB: [birth year],  MRN: [id number]  Chief Complaint  Patient presents with   wound care     Bil wound care. Pt states he has not been taking over the counter meds especially when he is feeling discomfort     45 y.o. male presents for wound care.  Patient presents with follow-up of bilateral hallux wound.  Patient is here to be have graft application done to help close the wound as this has been greater than 4 weeks of dealing with the wound.  Patient is a diabetic.   Review of Systems: Negative except as noted in the HPI. Denies N/V/F/Ch.  Past Medical History:  Diagnosis Date   GAD (generalized anxiety disorder)    Hyperlipidemia    Macular degeneration, bilateral    Retinopathy    Type II diabetes mellitus with complication, uncontrolled (HCC)    retinopathy, neuropathy, microalbuminuria    Current Outpatient Medications:    ALPRAZolam (XANAX) 0.25 MG tablet, Take 1 tablet (0.25 mg total) by mouth 2 (two) times daily as needed for anxiety., Disp: 60 tablet, Rfl: 2   amphetamine-dextroamphetamine (ADDERALL) 20 MG tablet, Take 1 tablet (20 mg total) by mouth 2 (two) times daily., Disp: 60 tablet, Rfl: 0   amphetamine-dextroamphetamine (ADDERALL) 20 MG tablet, Take 1 tablet (20 mg total) by mouth 2 (two) times daily., Disp: 60 tablet, Rfl: 0   amphetamine-dextroamphetamine (ADDERALL) 20 MG tablet, Take 1 tablet (20 mg total) by mouth 2 (two) times daily., Disp: 60 tablet, Rfl: 0   atorvastatin (LIPITOR) 40 MG tablet, Take 1 tablet (40 mg total) by mouth daily., Disp: 90 tablet, Rfl: 3   Dulaglutide (TRULICITY) 3 0000000 SOPN, Inject 3 mg as directed once a week., Disp: 6 mL, Rfl: 3   DULoxetine (CYMBALTA) 60 MG capsule, TAKE 1 CAPSULE(60 MG) BY MOUTH DAILY, Disp: 30 capsule, Rfl: 2   gabapentin (NEURONTIN) 300 MG capsule, TAKE 1 TO 2 CAPSULES(300 TO 600 MG) BY MOUTH AT BEDTIME, Disp: 180 capsule, Rfl: 1   glimepiride (AMARYL) 1 MG  tablet, Take 1 tablet (1 mg total) by mouth daily with breakfast., Disp: 90 tablet, Rfl: 3   losartan (COZAAR) 50 MG tablet, TAKE 1 TABLET(50 MG) BY MOUTH DAILY, Disp: 30 tablet, Rfl: 0   metFORMIN (GLUCOPHAGE-XR) 500 MG 24 hr tablet, Take 2 tablets (1,000 mg total) by mouth daily., Disp: 180 tablet, Rfl: 3   Ranibizumab (LUCENTIS) 0.3 MG/0.05ML SOLN, by Intravitreal route. , Disp: , Rfl:    tadalafil (CIALIS) 20 MG tablet, TK 1 T PO  PO QD PRN FOR ERECTILE DYSFUNCTION, Disp: 10 tablet, Rfl: 11   tobramycin (TOBREX) 0.3 % ophthalmic solution, INSTILL 1 DROP IN BOTH EYES 4 TIMES A DAY BEGIN 1 DAY  PRIOR TO TREATMENT CONTINUE THE DAY OF TREATMENT AND ONE FULL DAY AFTER TREATMENT, Disp: , Rfl: 5   traMADol (ULTRAM) 50 MG tablet, Take 1 tablet (50 mg total) by mouth every 6 (six) hours as needed., Disp: 20 tablet, Rfl: 0   traZODone (DESYREL) 100 MG tablet, TAKE 1 TABLET(100 MG) BY MOUTH AT BEDTIME AS NEEDED FOR SLEEP, Disp: 90 tablet, Rfl: 0  Social History   Tobacco Use  Smoking Status Every Day   Packs/day: 1.00   Years: 20.00   Pack years: 20.00   Types: Cigarettes  Smokeless Tobacco Never    No Known Allergies Objective:  There were no vitals filed for this visit. There is no height  or weight on file to calculate BMI. Constitutional Well developed. Well nourished.  Vascular Dorsalis pedis pulses failty palpable bilaterally. Posterior tibial pulses faintly palpable bilaterally. Capillary refill normal to all digits.  No cyanosis or clubbing noted. Pedal hair growth normal.  Neurologic Normal speech. Oriented to person, place, and time. Protective sensation absent  Dermatologic Wound Location:  bilateral hallux wound wit limited to the breakdown of the skin.  No clinical signs of infection noted.  No malodor present.  No purulent drainage noted. Wound Base: Mixed Granular/Fibrotic Peri-wound: Calloused Exudate: Scant/small amount Serosanguinous exudate Wound Measurements: -See  below  Orthopedic: No pain to palpation either foot.   Radiographs: None Assessment:   1. Diabetic ulcer of toe of right foot associated with type 2 diabetes mellitus, with fat layer exposed ([place])   2. Diabetic ulcer of toe of left foot associated with type 2 diabetes mellitus, with fat layer exposed ([place])      Plan:  Patient was evaluated and treated and all questions answered.  Ulcer bilateral hallux with limited to the breakdown of the skin -Debridement as below. -Dressed with Betadine wet-to-dry, DSD. -Continue off-loading with surgical shoe.  Bilateral -Given the chronic nature of this wound and since has been present for quite some time.  We will continue graft application and to resolve meant of the wound.  Application of synthetic skin graft/substitute  Name: Organogenesis Apligraf Lot #[id number] expiration date 2021-04-24 Usage: Apiligraft was applied directly to the listed above wound site and secured with Adaptic, kerlix, Ace bandage. Waste: No graft material was wasted and was applied in its entirety.   Procedure: Excisional Debridement of Wound right hallux Tool: Sharp chisel blade/tissue nipper Rationale: Removal of non-viable soft tissue from the wound to promote healing.  Anesthesia: none Pre-Debridement Wound Measurements: 1.0 cm x 0.5 cm x 0.3 cm Post-Debridement Wound Measurements: 1.0 cm x 0.5 cm x 0.3 cm Type of Debridement: Sharp Excisional Tissue Removed: Non-viable soft tissue Blood loss: Minimal (<50cc) Depth of Debridement: subcutaneous tissue. Technique: Sharp excisional debridement to bleeding, viable wound base.  Wound Progress: The wound is decreasing with continued application of the graft Dressing: Dry, sterile, compression dressing. Disposition: Patient tolerated procedure well. Patient to return in 1 week for follow-up.  Procedure: Excisional Debridement of Wound left hallux Tool: Sharp chisel blade/tissue nipper Rationale: Removal of  non-viable soft tissue from the wound to promote healing.  Anesthesia: none Pre-Debridement Wound Measurements: 0.7 cm x 0.7 x 0.2 Post-Debridement Wound Measurements: 0.7 x 0.7 x 0.2 Type of Debridement: Sharp Excisional Tissue Removed: Non-viable soft tissue Blood loss: Minimal (<50cc) Depth of Debridement: subcutaneous tissue. Technique: Sharp excisional debridement to bleeding, viable wound base.  Wound Progress: The wound is decreasing with continued application of graft Dressing: Dry, sterile, compression dressing. Disposition: Patient tolerated procedure well. Patient to return in 1 week for follow-up.  No follow-ups on file.  No follow-ups on file.

## 2021-04-22 ENCOUNTER — Ambulatory Visit (INDEPENDENT_AMBULATORY_CARE_PROVIDER_SITE_OTHER): Payer: 59 | Admitting: Podiatry

## 2021-04-22 ENCOUNTER — Other Ambulatory Visit: Payer: Self-pay

## 2021-04-22 DIAGNOSIS — L97522 Non-pressure chronic ulcer of other part of left foot with fat layer exposed: Secondary | ICD-10-CM

## 2021-04-22 DIAGNOSIS — L97512 Non-pressure chronic ulcer of other part of right foot with fat layer exposed: Secondary | ICD-10-CM | POA: Diagnosis not present

## 2021-04-22 DIAGNOSIS — E11621 Type 2 diabetes mellitus with foot ulcer: Secondary | ICD-10-CM

## 2021-04-27 ENCOUNTER — Encounter: Payer: Self-pay | Admitting: Podiatry

## 2021-04-27 NOTE — Progress Notes (Signed)
Subjective:  Patient ID: Jose [name], male    DOB: July 14, 1976,  MRN: [id number]  Chief Complaint  Patient presents with   Wound Check    Bilateral wound care    45 y.o. male presents for wound care.  Patient presents with follow-up of bilateral hallux wound.  We will continue on graft holiday today.  Will resume application during next week.  The left side has clinically healed.   Review of Systems: Negative except as noted in the HPI. Denies N/V/F/Ch.  Past Medical History:  Diagnosis Date   GAD (generalized anxiety disorder)    Hyperlipidemia    Macular degeneration, bilateral    Retinopathy    Type II diabetes mellitus with complication, uncontrolled (HCC)    retinopathy, neuropathy, microalbuminuria    Current Outpatient Medications:    ALPRAZolam (XANAX) 0.25 MG tablet, Take 1 tablet (0.25 mg total) by mouth 2 (two) times daily as needed for anxiety., Disp: 60 tablet, Rfl: 2   amphetamine-dextroamphetamine (ADDERALL) 20 MG tablet, Take 1 tablet (20 mg total) by mouth 2 (two) times daily., Disp: 60 tablet, Rfl: 0   amphetamine-dextroamphetamine (ADDERALL) 20 MG tablet, Take 1 tablet (20 mg total) by mouth 2 (two) times daily., Disp: 60 tablet, Rfl: 0   amphetamine-dextroamphetamine (ADDERALL) 20 MG tablet, Take 1 tablet (20 mg total) by mouth 2 (two) times daily., Disp: 60 tablet, Rfl: 0   atorvastatin (LIPITOR) 40 MG tablet, Take 1 tablet (40 mg total) by mouth daily., Disp: 90 tablet, Rfl: 3   Dulaglutide (TRULICITY) 3 0000000 SOPN, Inject 3 mg as directed once a week., Disp: 6 mL, Rfl: 3   DULoxetine (CYMBALTA) 60 MG capsule, TAKE 1 CAPSULE(60 MG) BY MOUTH DAILY, Disp: 30 capsule, Rfl: 2   gabapentin (NEURONTIN) 300 MG capsule, TAKE 1 TO 2 CAPSULES(300 TO 600 MG) BY MOUTH AT BEDTIME, Disp: 180 capsule, Rfl: 1   glimepiride (AMARYL) 1 MG tablet, Take 1 tablet (1 mg total) by mouth daily with breakfast., Disp: 90 tablet, Rfl: 3   losartan (COZAAR) 50 MG tablet, TAKE 1  TABLET(50 MG) BY MOUTH DAILY, Disp: 30 tablet, Rfl: 0   metFORMIN (GLUCOPHAGE-XR) 500 MG 24 hr tablet, Take 2 tablets (1,000 mg total) by mouth daily., Disp: 180 tablet, Rfl: 3   Ranibizumab (LUCENTIS) 0.3 MG/0.05ML SOLN, by Intravitreal route. , Disp: , Rfl:    tadalafil (CIALIS) 20 MG tablet, TK 1 T PO  PO QD PRN FOR ERECTILE DYSFUNCTION, Disp: 10 tablet, Rfl: 11   tobramycin (TOBREX) 0.3 % ophthalmic solution, INSTILL 1 DROP IN BOTH EYES 4 TIMES A DAY BEGIN 1 DAY  PRIOR TO TREATMENT CONTINUE THE DAY OF TREATMENT AND ONE FULL DAY AFTER TREATMENT, Disp: , Rfl: 5   traMADol (ULTRAM) 50 MG tablet, Take 1 tablet (50 mg total) by mouth every 6 (six) hours as needed., Disp: 20 tablet, Rfl: 0   traZODone (DESYREL) 100 MG tablet, TAKE 1 TABLET(100 MG) BY MOUTH AT BEDTIME AS NEEDED FOR SLEEP, Disp: 90 tablet, Rfl: 0  Social History   Tobacco Use  Smoking Status Every Day   Packs/day: 1.00   Years: 20.00   Pack years: 20.00   Types: Cigarettes  Smokeless Tobacco Never    No Known Allergies Objective:  There were no vitals filed for this visit. There is no height or weight on file to calculate BMI. Constitutional Well developed. Well nourished.  Vascular Dorsalis pedis pulses failty palpable bilaterally. Posterior tibial pulses faintly palpable bilaterally. Capillary refill normal to  all digits.  No cyanosis or clubbing noted. Pedal hair growth normal.  Neurologic Normal speech. Oriented to person, place, and time. Protective sensation absent  Dermatologic Wound Location:  bilateral hallux wound wit limited to the breakdown of the skin.  No clinical signs of infection noted.  No malodor present.  No purulent drainage noted. Wound Base: Mixed Granular/Fibrotic Peri-wound: Calloused Exudate: Scant/small amount Serosanguinous exudate Wound Measurements: -See below  Orthopedic: No pain to palpation either foot.   Radiographs: None Assessment:   1. Diabetic ulcer of toe of right foot  associated with type 2 diabetes mellitus, with fat layer exposed ([place])   2. Diabetic ulcer of toe of left foot associated with type 2 diabetes mellitus, with fat layer exposed ([place])       Plan:  Patient was evaluated and treated and all questions answered.  Ulcer bilateral hallux with limited to the breakdown of the skin -Debridement as below. -Dressed with Betadine wet-to-dry, DSD. -Continue off-loading with surgical shoe.  Bilateral -Given the chronic nature of this wound and since has been present for quite some time.  We will continue graft application and to resolve meant of the wound.  Application of synthetic skin graft/substitute  -We will do a graft holiday for this week.  We will resume application during next week if the wounds have not resolved by themselves.   Procedure: Excisional Debridement of Wound right hallux Tool: Sharp chisel blade/tissue nipper Rationale: Removal of non-viable soft tissue from the wound to promote healing.  Anesthesia: none Pre-Debridement Wound Measurements: 0.5cm x 0.3 cm x 0. 2 cm Post-Debridement Wound Measurements: 0.5 cm x 0.3 cm x 0.2 cm Type of Debridement: Sharp Excisional Tissue Removed: Non-viable soft tissue Blood loss: Minimal (<50cc) Depth of Debridement: subcutaneous tissue. Technique: Sharp excisional debridement to bleeding, viable wound base.  Wound Progress: The wound is decreasing with continued application of the graft Dressing: Dry, sterile, compression dressing. Disposition: Patient tolerated procedure well. Patient to return in 1 week for follow-up.  Left hallux wound -Clinically healed with graft application.  No further grafts are warranted at this time.  No follow-ups on file.  No follow-ups on file.

## 2021-05-01 ENCOUNTER — Other Ambulatory Visit: Payer: Self-pay

## 2021-05-01 ENCOUNTER — Ambulatory Visit (INDEPENDENT_AMBULATORY_CARE_PROVIDER_SITE_OTHER): Payer: 59 | Admitting: Podiatry

## 2021-05-01 DIAGNOSIS — L97512 Non-pressure chronic ulcer of other part of right foot with fat layer exposed: Secondary | ICD-10-CM

## 2021-05-01 DIAGNOSIS — E11621 Type 2 diabetes mellitus with foot ulcer: Secondary | ICD-10-CM | POA: Diagnosis not present

## 2021-05-06 ENCOUNTER — Ambulatory Visit: Payer: 59 | Admitting: Podiatry

## 2021-05-06 ENCOUNTER — Encounter: Payer: Self-pay | Admitting: Podiatry

## 2021-05-06 NOTE — Progress Notes (Signed)
Subjective:  Patient ID: Jose [name], male    DOB: 10-21-1976,  MRN: [id number]  Chief Complaint  Patient presents with   Wound Check    Wound check bilateral     45 y.o. male presents for wound care.  Patient presents for follow-up of bilateral hallux wound.  The left side is still healed.  The right side is doing a little bit better.  He denies any other acute complaints.   Review of Systems: Negative except as noted in the HPI. Denies N/V/F/Ch.  Past Medical History:  Diagnosis Date   GAD (generalized anxiety disorder)    Hyperlipidemia    Macular degeneration, bilateral    Retinopathy    Type II diabetes mellitus with complication, uncontrolled (HCC)    retinopathy, neuropathy, microalbuminuria    Current Outpatient Medications:    ALPRAZolam (XANAX) 0.25 MG tablet, Take 1 tablet (0.25 mg total) by mouth 2 (two) times daily as needed for anxiety., Disp: 60 tablet, Rfl: 2   amphetamine-dextroamphetamine (ADDERALL) 20 MG tablet, Take 1 tablet (20 mg total) by mouth 2 (two) times daily., Disp: 60 tablet, Rfl: 0   amphetamine-dextroamphetamine (ADDERALL) 20 MG tablet, Take 1 tablet (20 mg total) by mouth 2 (two) times daily., Disp: 60 tablet, Rfl: 0   amphetamine-dextroamphetamine (ADDERALL) 20 MG tablet, Take 1 tablet (20 mg total) by mouth 2 (two) times daily., Disp: 60 tablet, Rfl: 0   atorvastatin (LIPITOR) 40 MG tablet, Take 1 tablet (40 mg total) by mouth daily., Disp: 90 tablet, Rfl: 3   Dulaglutide (TRULICITY) 3 0000000 SOPN, Inject 3 mg as directed once a week., Disp: 6 mL, Rfl: 3   DULoxetine (CYMBALTA) 60 MG capsule, TAKE 1 CAPSULE(60 MG) BY MOUTH DAILY, Disp: 30 capsule, Rfl: 2   gabapentin (NEURONTIN) 300 MG capsule, TAKE 1 TO 2 CAPSULES(300 TO 600 MG) BY MOUTH AT BEDTIME, Disp: 180 capsule, Rfl: 1   glimepiride (AMARYL) 1 MG tablet, Take 1 tablet (1 mg total) by mouth daily with breakfast., Disp: 90 tablet, Rfl: 3   losartan (COZAAR) 50 MG tablet, TAKE 1  TABLET(50 MG) BY MOUTH DAILY, Disp: 30 tablet, Rfl: 0   metFORMIN (GLUCOPHAGE-XR) 500 MG 24 hr tablet, Take 2 tablets (1,000 mg total) by mouth daily., Disp: 180 tablet, Rfl: 3   Ranibizumab (LUCENTIS) 0.3 MG/0.05ML SOLN, by Intravitreal route. , Disp: , Rfl:    tadalafil (CIALIS) 20 MG tablet, TK 1 T PO  PO QD PRN FOR ERECTILE DYSFUNCTION, Disp: 10 tablet, Rfl: 11   tobramycin (TOBREX) 0.3 % ophthalmic solution, INSTILL 1 DROP IN BOTH EYES 4 TIMES A DAY BEGIN 1 DAY  PRIOR TO TREATMENT CONTINUE THE DAY OF TREATMENT AND ONE FULL DAY AFTER TREATMENT, Disp: , Rfl: 5   traMADol (ULTRAM) 50 MG tablet, Take 1 tablet (50 mg total) by mouth every 6 (six) hours as needed., Disp: 20 tablet, Rfl: 0   traZODone (DESYREL) 100 MG tablet, TAKE 1 TABLET(100 MG) BY MOUTH AT BEDTIME AS NEEDED FOR SLEEP, Disp: 90 tablet, Rfl: 0  Social History   Tobacco Use  Smoking Status Every Day   Packs/day: 1.00   Years: 20.00   Pack years: 20.00   Types: Cigarettes  Smokeless Tobacco Never    No Known Allergies Objective:  There were no vitals filed for this visit. There is no height or weight on file to calculate BMI. Constitutional Well developed. Well nourished.  Vascular Dorsalis pedis pulses failty palpable bilaterally. Posterior tibial pulses faintly palpable bilaterally. Capillary  refill normal to all digits.  No cyanosis or clubbing noted. Pedal hair growth normal.  Neurologic Normal speech. Oriented to person, place, and time. Protective sensation absent  Dermatologic Wound Location: Right hallux wound wit limited to the breakdown of the skin.  No clinical signs of infection noted.  No malodor present.  No purulent drainage noted. Wound Base: Mixed Granular/Fibrotic Peri-wound: Calloused Exudate: Scant/small amount Serosanguinous exudate Wound Measurements: -See below  Left hallux wound completely epithelialized no further infection noted or ulceration  Orthopedic: No pain to palpation either  foot.   Radiographs: None Assessment:   1. Diabetic ulcer of toe of right foot associated with type 2 diabetes mellitus, with fat layer exposed ([place])       Plan:  Patient was evaluated and treated and all questions answered.  Left hallux wound -Completely epithelialized.  No further ulceration noted  Ulcer bilateral hallux with limited to the breakdown of the skin -Debridement as below. -Dressed with Betadine wet-to-dry, DSD. -Continue off-loading with surgical shoe.  Bilateral -Given the chronic nature of this wound and since has been present for quite some time.  We will continue graft application and to resolve meant of the wound.  Application of synthetic skin graft/substitute  Name: Organogenesis Apligraf Lot #[id number] expiration date 2021-05-08 Usage: Apiligraft was applied directly to the listed above wound site and secured with Adaptic, kerlix, Ace bandage. Waste: No graft material was wasted and was applied in its entirety.   Procedure: Excisional Debridement of Wound right hallux Tool: Sharp chisel blade/tissue nipper Rationale: Removal of non-viable soft tissue from the wound to promote healing.  Anesthesia: none Pre-Debridement Wound Measurements: 0.4cm x 0.3 cm x 0. 2 cm Post-Debridement Wound Measurements: 0.4cm x 0.3 cm x 0. 2 cm Type of Debridement: Sharp Excisional Tissue Removed: Non-viable soft tissue Blood loss: Minimal (<50cc) Depth of Debridement: subcutaneous tissue. Technique: Sharp excisional debridement to bleeding, viable wound base.  Wound Progress: The wound is decreasing with continued application of the graft Dressing: Dry, sterile, compression dressing. Disposition: Patient tolerated procedure well. Patient to return in 1 week for follow-up.   No follow-ups on file.  No follow-ups on file.

## 2021-05-18 ENCOUNTER — Inpatient Hospital Stay (HOSPITAL_BASED_OUTPATIENT_CLINIC_OR_DEPARTMENT_OTHER)
Admission: EM | Admit: 2021-05-18 | Discharge: 2021-05-21 | DRG: 178 | Disposition: A | Discharge status: Home / Self Care | Payer: 59 | Attending: Internal Medicine | Admitting: Internal Medicine

## 2021-05-18 ENCOUNTER — Emergency Department (HOSPITAL_BASED_OUTPATIENT_CLINIC_OR_DEPARTMENT_OTHER): Payer: 59

## 2021-05-18 ENCOUNTER — Encounter (HOSPITAL_BASED_OUTPATIENT_CLINIC_OR_DEPARTMENT_OTHER): Payer: Self-pay | Admitting: Obstetrics and Gynecology

## 2021-05-18 ENCOUNTER — Other Ambulatory Visit: Payer: Self-pay

## 2021-05-18 DIAGNOSIS — Z833 Family history of diabetes mellitus: Secondary | ICD-10-CM

## 2021-05-18 DIAGNOSIS — Z83438 Family history of other disorder of lipoprotein metabolism and other lipidemia: Secondary | ICD-10-CM

## 2021-05-18 DIAGNOSIS — E11319 Type 2 diabetes mellitus with unspecified diabetic retinopathy without macular edema: Secondary | ICD-10-CM | POA: Diagnosis present

## 2021-05-18 DIAGNOSIS — N179 Acute kidney failure, unspecified: Secondary | ICD-10-CM | POA: Diagnosis present

## 2021-05-18 DIAGNOSIS — F909 Attention-deficit hyperactivity disorder, unspecified type: Secondary | ICD-10-CM | POA: Diagnosis present

## 2021-05-18 DIAGNOSIS — Z7984 Long term (current) use of oral hypoglycemic drugs: Secondary | ICD-10-CM | POA: Diagnosis not present

## 2021-05-18 DIAGNOSIS — Z87891 Personal history of nicotine dependence: Secondary | ICD-10-CM

## 2021-05-18 DIAGNOSIS — U071 COVID-19: Principal | ICD-10-CM | POA: Diagnosis present

## 2021-05-18 DIAGNOSIS — N1831 Chronic kidney disease, stage 3a: Secondary | ICD-10-CM | POA: Diagnosis not present

## 2021-05-18 DIAGNOSIS — I129 Hypertensive chronic kidney disease with stage 1 through stage 4 chronic kidney disease, or unspecified chronic kidney disease: Secondary | ICD-10-CM | POA: Diagnosis present

## 2021-05-18 DIAGNOSIS — Z818 Family history of other mental and behavioral disorders: Secondary | ICD-10-CM

## 2021-05-18 DIAGNOSIS — R200 Anesthesia of skin: Secondary | ICD-10-CM | POA: Diagnosis present

## 2021-05-18 DIAGNOSIS — F17210 Nicotine dependence, cigarettes, uncomplicated: Secondary | ICD-10-CM | POA: Diagnosis not present

## 2021-05-18 DIAGNOSIS — E1122 Type 2 diabetes mellitus with diabetic chronic kidney disease: Secondary | ICD-10-CM | POA: Diagnosis present

## 2021-05-18 DIAGNOSIS — E1142 Type 2 diabetes mellitus with diabetic polyneuropathy: Secondary | ICD-10-CM | POA: Diagnosis not present

## 2021-05-18 DIAGNOSIS — R030 Elevated blood-pressure reading, without diagnosis of hypertension: Secondary | ICD-10-CM | POA: Diagnosis present

## 2021-05-18 DIAGNOSIS — R0602 Shortness of breath: Secondary | ICD-10-CM | POA: Diagnosis present

## 2021-05-18 DIAGNOSIS — D631 Anemia in chronic kidney disease: Secondary | ICD-10-CM | POA: Diagnosis present

## 2021-05-18 DIAGNOSIS — E785 Hyperlipidemia, unspecified: Secondary | ICD-10-CM | POA: Diagnosis not present

## 2021-05-18 DIAGNOSIS — E1165 Type 2 diabetes mellitus with hyperglycemia: Secondary | ICD-10-CM | POA: Diagnosis present

## 2021-05-18 DIAGNOSIS — E860 Dehydration: Secondary | ICD-10-CM | POA: Diagnosis not present

## 2021-05-18 DIAGNOSIS — Z823 Family history of stroke: Secondary | ICD-10-CM

## 2021-05-18 DIAGNOSIS — R112 Nausea with vomiting, unspecified: Secondary | ICD-10-CM | POA: Diagnosis not present

## 2021-05-18 DIAGNOSIS — N170 Acute kidney failure with tubular necrosis: Secondary | ICD-10-CM

## 2021-05-18 DIAGNOSIS — F411 Generalized anxiety disorder: Secondary | ICD-10-CM | POA: Diagnosis present

## 2021-05-18 DIAGNOSIS — Z794 Long term (current) use of insulin: Secondary | ICD-10-CM | POA: Diagnosis present

## 2021-05-18 DIAGNOSIS — H3530 Unspecified macular degeneration: Secondary | ICD-10-CM | POA: Diagnosis not present

## 2021-05-18 DIAGNOSIS — Z79899 Other long term (current) drug therapy: Secondary | ICD-10-CM | POA: Diagnosis not present

## 2021-05-18 DIAGNOSIS — D638 Anemia in other chronic diseases classified elsewhere: Secondary | ICD-10-CM | POA: Diagnosis present

## 2021-05-18 DIAGNOSIS — D649 Anemia, unspecified: Secondary | ICD-10-CM | POA: Diagnosis present

## 2021-05-18 LAB — CBC
HCT: 32.5 % — ABNORMAL LOW (ref 39.0–52.0)
Hemoglobin: 11.2 g/dL — ABNORMAL LOW (ref 13.0–17.0)
MCH: 29.2 pg (ref 26.0–34.0)
MCHC: 34.5 g/dL (ref 30.0–36.0)
MCV: 84.9 fL (ref 80.0–100.0)
Platelets: 214 10*3/uL (ref 150–400)
RBC: 3.83 MIL/uL — ABNORMAL LOW (ref 4.22–5.81)
RDW: 11.9 % (ref 11.5–15.5)
WBC: 6.9 10*3/uL (ref 4.0–10.5)
nRBC: 0 % (ref 0.0–0.2)

## 2021-05-18 LAB — COMPREHENSIVE METABOLIC PANEL
ALT: 26 U/L (ref 0–44)
AST: 24 U/L (ref 15–41)
Albumin: 4.4 g/dL (ref 3.5–5.0)
Alkaline Phosphatase: 90 U/L (ref 38–126)
Anion gap: 15 (ref 5–15)
BUN: 29 mg/dL — ABNORMAL HIGH (ref 6–20)
CO2: 22 mmol/L (ref 22–32)
Calcium: 8.8 mg/dL — ABNORMAL LOW (ref 8.9–10.3)
Chloride: 100 mmol/L (ref 98–111)
Creatinine, Ser: 2.35 mg/dL — ABNORMAL HIGH (ref 0.61–1.24)
GFR, Estimated: 34 mL/min — ABNORMAL LOW (ref >60)
Glucose, Bld: 190 mg/dL — ABNORMAL HIGH (ref 70–99)
Potassium: 4.3 mmol/L (ref 3.5–5.1)
Sodium: 137 mmol/L (ref 135–145)
Total Bilirubin: 0.7 mg/dL (ref 0.3–1.2)
Total Protein: 8.1 g/dL (ref 6.5–8.1)

## 2021-05-18 LAB — TROPONIN I (HIGH SENSITIVITY)
Troponin I (High Sensitivity): 7 ng/L (ref <18)
Troponin I (High Sensitivity): 7 ng/L (ref <18)

## 2021-05-18 LAB — TRIGLYCERIDES: Triglycerides: 140 mg/dL (ref <150)

## 2021-05-18 LAB — RESP PANEL BY RT-PCR (FLU A&B, COVID) ARPGX2
Influenza A by PCR: NEGATIVE
Influenza B by PCR: NEGATIVE
SARS Coronavirus 2 by RT PCR: POSITIVE — AB

## 2021-05-18 LAB — FERRITIN: Ferritin: 167 ng/mL (ref 24–336)

## 2021-05-18 LAB — LACTATE DEHYDROGENASE: LDH: 117 U/L (ref 98–192)

## 2021-05-18 LAB — CBG MONITORING, ED: Glucose-Capillary: 192 mg/dL — ABNORMAL HIGH (ref 70–99)

## 2021-05-18 LAB — D-DIMER, QUANTITATIVE: D-Dimer, Quant: <0.27 ug/mL-FEU (ref 0.00–0.50)

## 2021-05-18 LAB — FIBRINOGEN: Fibrinogen: 481 mg/dL — ABNORMAL HIGH (ref 210–475)

## 2021-05-18 LAB — LACTIC ACID, PLASMA: Lactic Acid, Venous: 1.5 mmol/L (ref 0.5–1.9)

## 2021-05-18 LAB — C-REACTIVE PROTEIN: CRP: 3 mg/dL — ABNORMAL HIGH (ref <1.0)

## 2021-05-18 LAB — PROCALCITONIN: Procalcitonin: <0.1 ng/mL

## 2021-05-18 IMAGING — DX DG CHEST 1V PORT
1 series · 1 of 1 positions shown · non-contrast
Comparison: None.

CLINICAL DATA: COVID positive

EXAM:
PORTABLE CHEST 1 VIEW

[chest]
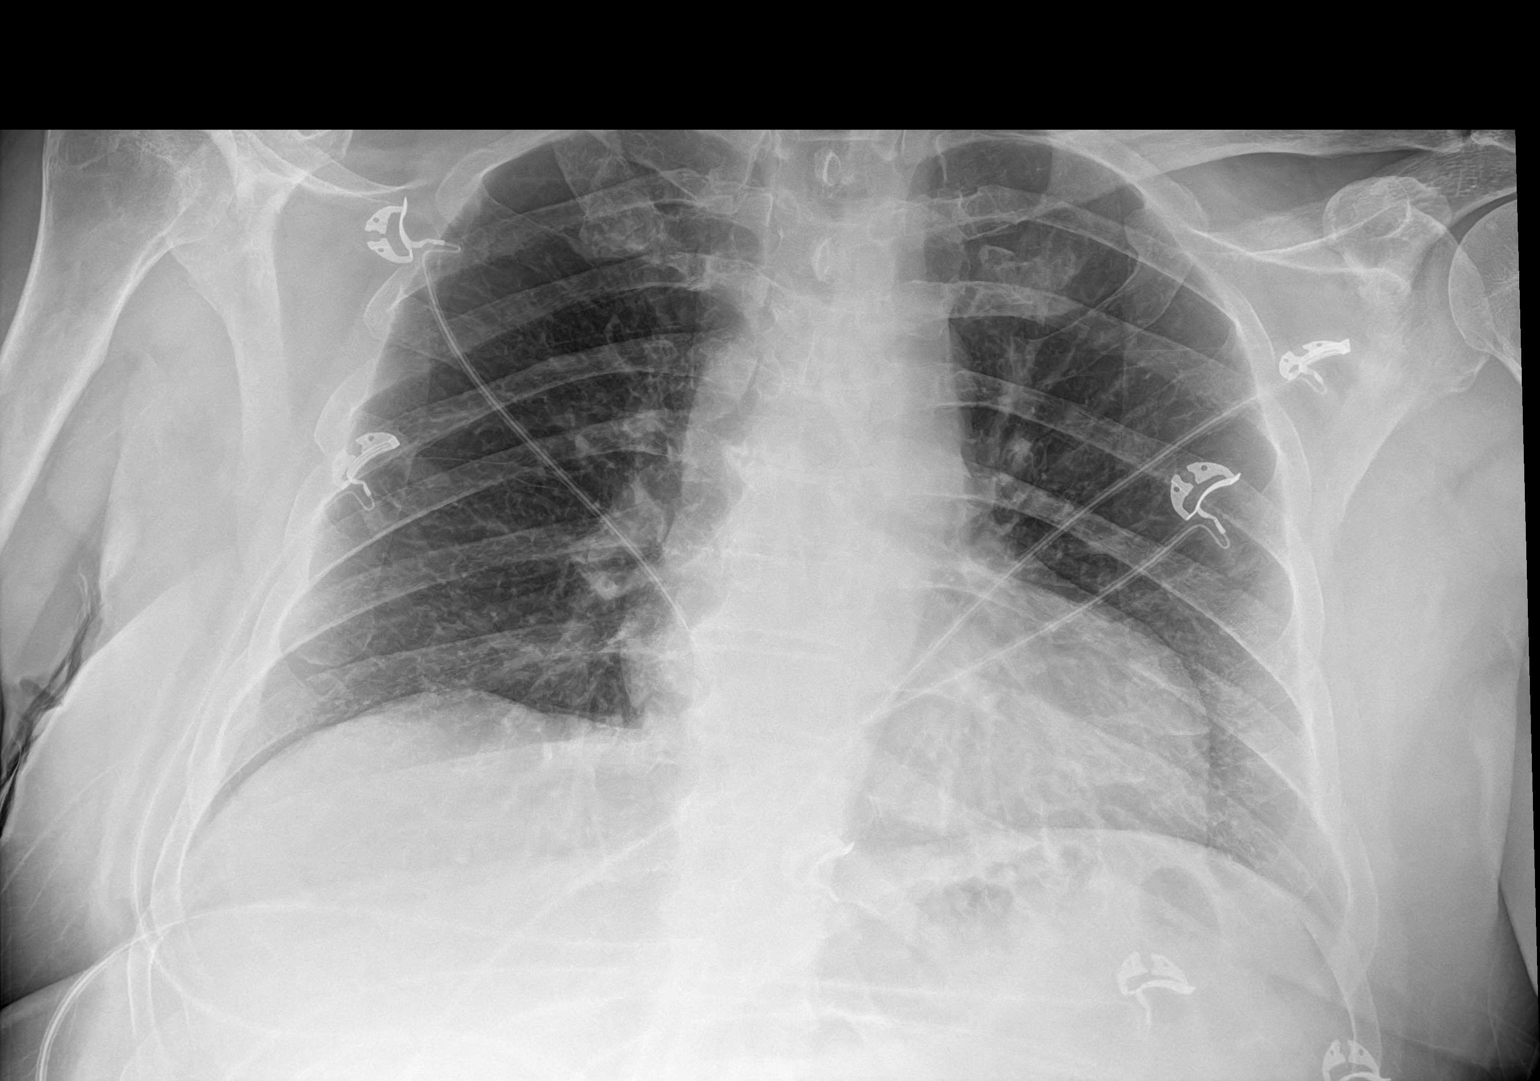

[1 of 1 positions shown; findings below may reference images not displayed]

FINDINGS: The heart size and mediastinal contours are within normal limits. No
focal airspace consolidation. The visualized skeletal structures are
unremarkable.
IMPRESSION: No evidence of acute cardiopulmonary disease.

## 2021-05-18 MED ORDER — LACTATED RINGERS IV BOLUS: 1000.0000 mL | Freq: Once | INTRAVENOUS | Status: DC

## 2021-05-18 MED ORDER — SODIUM CHLORIDE 0.9 % IV SOLN
100.0000 mg | Freq: Every day | INTRAVENOUS | Status: DC
  Administered 2021-05-19 – 2021-05-21 (×3): 100 mg via INTRAVENOUS
  Filled 2021-05-18 (×3): qty 20

## 2021-05-18 MED ORDER — LACTATED RINGERS IV SOLN: INTRAVENOUS | Status: DC

## 2021-05-18 MED ORDER — ONDANSETRON HCL 4 MG/2ML IJ SOLN
4.0000 mg | Freq: Once | INTRAMUSCULAR | Status: AC
  Administered 2021-05-18: 4 mg via INTRAVENOUS
  Filled 2021-05-18: qty 2

## 2021-05-18 MED ORDER — SODIUM CHLORIDE 0.9 % IV SOLN
100.0000 mg | Freq: Once | INTRAVENOUS | Status: AC
  Administered 2021-05-18: 100 mg via INTRAVENOUS

## 2021-05-18 MED ORDER — LACTATED RINGERS IV BOLUS
1000.0000 mL | Freq: Once | INTRAVENOUS | Status: AC
  Administered 2021-05-18: 1000 mL via INTRAVENOUS

## 2021-05-18 NOTE — ED Notes (Signed)
EDP at bedside  

## 2021-05-18 NOTE — ED Triage Notes (Signed)
Covid + this past Friday, coughing and  weakness.

## 2021-05-18 NOTE — ED Notes (Signed)
Pt has been transferred to  [facility] via [facility] - pt is stable

## 2021-05-18 NOTE — ED Notes (Signed)
When attempting to stand patient he becomes diaphoretic and dizzy. Patient has frequent coughing spells with emesis following hard coughing.

## 2021-05-18 NOTE — Plan of Care (Signed)
[facility] will assume care on arrival to accepting facility. Until arrival, care as per EDP. However, [facility] available 24/7 for questions and assistance.  

## 2021-05-18 NOTE — ED Notes (Signed)
RN updated patient's emergency contact at his request

## 2021-05-18 NOTE — ED Notes (Signed)
Per patient's wife:  Patient has not been taking his medication for the last few days. Patient reportedly has not had his Cymbalta either. Patient reports his wife and his daughter also have COVID.

## 2021-05-18 NOTE — ED Provider Notes (Signed)
[facility] Provider Note  CSN: [id number] Arrival date & time: 05/18/21 1612    History Chief Complaint  Patient presents with   Shortness of Breath     Jose [name] is a 45 y.o. male with DM, CKD and anxiety reports he began having fever, myalgias, general malaise and cough 5 days ago, tested positive for Covid 4 days ago. Now feeling more SOB, generally weak. Sugars have been elevated. He reports not being able to keep anything down at home. He has not had a fever in a couple of days.    Past Medical History:  Diagnosis Date   GAD (generalized anxiety disorder)    Hyperlipidemia    Macular degeneration, bilateral    Retinopathy    Type II diabetes mellitus with complication, uncontrolled ([place])    retinopathy, neuropathy, microalbuminuria    Past Surgical History:  Procedure Laterality Date   APPENDECTOMY     HERNIA REPAIR     TRIGGER FINGER RELEASE Right 10/25/2019   Procedure: RIGHT INDEX FINGER RELEASE TRIGGER FINGER/A-1 PULLEY;  Surgeon: Daryll Brod, MD;  Location: [facility];  Service: Orthopedics;  Laterality: Right;  IV REGIONAL FOREARM BLOCK    Family History  Problem Relation Age of Onset   Diabetes Mother    Hyperlipidemia Mother    Stroke Mother    Diabetes Father    Hyperlipidemia Brother    Stroke Brother    ADD / ADHD Brother    ADD / ADHD Son     Social History   Tobacco Use   Smoking status: Every Day    Packs/day: 1.00    Years: 20.00    Pack years: 20.00    Types: Cigarettes   Smokeless tobacco: Never  Vaping Use   Vaping Use: Never used  Substance Use Topics   Alcohol use: Not Currently   Drug use: Never     Home Medications Prior to Admission medications   Medication Sig Start Date End Date Taking? Authorizing Provider  ALPRAZolam (XANAX) 0.25 MG tablet Take 1 tablet (0.25 mg total) by mouth 2 (two) times daily as needed for anxiety. 03/05/21 06/03/21  Elvin So, MD   amphetamine-dextroamphetamine (ADDERALL) 20 MG tablet Take 1 tablet (20 mg total) by mouth 2 (two) times daily. 03/05/21 04/04/21  Elvin So, MD  amphetamine-dextroamphetamine (ADDERALL) 20 MG tablet Take 1 tablet (20 mg total) by mouth 2 (two) times daily. 03/05/21 04/04/21  Elvin So, MD  amphetamine-dextroamphetamine (ADDERALL) 20 MG tablet Take 1 tablet (20 mg total) by mouth 2 (two) times daily. 03/05/21 04/04/21  Elvin So, MD  atorvastatin (LIPITOR) 40 MG tablet Take 1 tablet (40 mg total) by mouth daily. 03/12/19   Jacelyn Pi, Lilia Argue, MD  Dulaglutide (TRULICITY) 3 0000000 SOPN Inject 3 mg as directed once a week. 01/08/21   Renato Shin, MD  DULoxetine (CYMBALTA) 60 MG capsule TAKE 1 CAPSULE(60 MG) BY MOUTH DAILY 03/05/21   Elvin So, MD  gabapentin (NEURONTIN) 300 MG capsule TAKE 1 TO 2 CAPSULES(300 TO 600 MG) BY MOUTH AT BEDTIME 12/01/19   Jacelyn Pi, Lilia Argue, MD  glimepiride (AMARYL) 1 MG tablet Take 1 tablet (1 mg total) by mouth daily with breakfast. 01/08/21   Renato Shin, MD  losartan (COZAAR) 50 MG tablet TAKE 1 TABLET(50 MG) BY MOUTH DAILY 07/21/20   Jacelyn Pi, Lilia Argue, MD  metFORMIN (GLUCOPHAGE-XR) 500 MG 24 hr tablet Take 2 tablets (1,000 mg total) by mouth daily. 03/10/21  Renato Shin, MD  Ranibizumab (LUCENTIS) 0.3 MG/0.05ML SOLN by Intravitreal route.     [provider]  tadalafil (CIALIS) 20 MG tablet TK 1 T PO  PO QD PRN FOR ERECTILE DYSFUNCTION 07/20/19   Jacelyn Pi, Lilia Argue, MD  tobramycin (TOBREX) 0.3 % ophthalmic solution INSTILL 1 DROP IN BOTH EYES 4 TIMES A DAY BEGIN 1 DAY  PRIOR TO TREATMENT CONTINUE THE DAY OF TREATMENT AND ONE FULL DAY AFTER TREATMENT 07/05/18   [provider]  traMADol (ULTRAM) 50 MG tablet Take 1 tablet (50 mg total) by mouth every 6 (six) hours as needed. 10/25/19   Daryll Brod, MD  traZODone (DESYREL) 100 MG tablet TAKE 1 TABLET(100 MG) BY MOUTH AT BEDTIME AS NEEDED FOR SLEEP 03/05/21   Elvin So,  MD     Allergies    Patient has no known allergies.   Review of Systems   Review of Systems A comprehensive review of systems was completed and negative except as noted in HPI.    Physical Exam BP (!) 168/90   Pulse 95   Temp 98.6 F (37 C)   Resp 19   SpO2 99%   Physical Exam Vitals and nursing note reviewed.  Constitutional:      Appearance: Normal appearance. He is ill-appearing and diaphoretic.  HENT:     Head: Normocephalic and atraumatic.     Nose: Nose normal.     Mouth/Throat:     Mouth: Mucous membranes are moist.  Eyes:     Extraocular Movements: Extraocular movements intact.     Conjunctiva/sclera: Conjunctivae normal.  Cardiovascular:     Rate and Rhythm: Tachycardia present.  Pulmonary:     Effort: Pulmonary effort is normal.     Breath sounds: Normal breath sounds. No wheezing or rales.     Comments: tachypneic Abdominal:     General: Abdomen is flat.     Palpations: Abdomen is soft.     Tenderness: There is no abdominal tenderness.  Musculoskeletal:        General: No swelling. Normal range of motion.     Cervical back: Neck supple.  Skin:    General: Skin is warm.  Neurological:     General: No focal deficit present.     Mental Status: He is alert.  Psychiatric:        Mood and Affect: Mood normal.     ED Results / Procedures / Treatments   Labs (all labs ordered are listed, but only abnormal results are displayed) Labs Reviewed  RESP PANEL BY RT-PCR (FLU A&B, COVID) ARPGX2 - Abnormal; Notable for the following components:      Result Value   SARS Coronavirus 2 by RT PCR POSITIVE (*)    All other components within normal limits  CBC - Abnormal; Notable for the following components:   RBC 3.83 (*)    Hemoglobin 11.2 (*)    HCT 32.5 (*)    All other components within normal limits  COMPREHENSIVE METABOLIC PANEL - Abnormal; Notable for the following components:   Glucose, Bld 190 (*)    BUN 29 (*)    Creatinine, Ser 2.35 (*)     Calcium 8.8 (*)    GFR, Estimated 34 (*)    All other components within normal limits  FIBRINOGEN - Abnormal; Notable for the following components:   Fibrinogen 481 (*)    All other components within normal limits  CBG MONITORING, ED - Abnormal; Notable for the following components:  Glucose-Capillary 192 (*)    All other components within normal limits  CULTURE, BLOOD (ROUTINE X 2)  CULTURE, BLOOD (ROUTINE X 2)  LACTIC ACID, PLASMA  D-DIMER, QUANTITATIVE  LACTATE DEHYDROGENASE  TRIGLYCERIDES  BASIC METABOLIC PANEL  LACTIC ACID, PLASMA  PROCALCITONIN  FERRITIN  C-REACTIVE PROTEIN  TROPONIN I (HIGH SENSITIVITY)  TROPONIN I (HIGH SENSITIVITY)    EKG EKG Interpretation  Date/Time:  Monday May 18 2021 17:04:50 EDT Ventricular Rate:  103 PR Interval:  153 QRS Duration: 92 QT Interval:  326 QTC Calculation: 427 R Axis:   64 Text Interpretation: Duplicate Confirmed by Calvert Cantor [phone]) on 05/18/2021 5:19:00 PM   Radiology DG Chest Port 1 View  Result Date: 05/18/2021 CLINICAL DATA:  COVID positive EXAM: PORTABLE CHEST 1 VIEW COMPARISON:  None. FINDINGS: The heart size and mediastinal contours are within normal limits. No focal airspace consolidation. The visualized skeletal structures are unremarkable. IMPRESSION: No evidence of acute cardiopulmonary disease. Electronically Signed   By: Maurine Simmering   On: 05/18/2021 18:01    Procedures Procedures  Medications Ordered in the ED Medications  lactated ringers bolus 1,000 mL ( Intravenous Canceled Entry 05/18/21 1935)  lactated ringers infusion ( Intravenous New Bag/Given 05/18/21 1934)  lactated ringers bolus 1,000 mL (0 mLs Intravenous Stopped 05/18/21 1917)  ondansetron (ZOFRAN) injection 4 mg (4 mg Intravenous Given 05/18/21 1753)     MDM Rules/Calculators/A&P MDM Patient with reported positive covid test 4 days ago with 5 days of symptoms. Appears weak, diaphoretic, and tachypneic, but not hypoxic on room air.  Will check labs, CXR, begin IVF for reported decreased PO intake.   ED Course  I have reviewed the triage vital signs and the nursing notes.  Pertinent labs & imaging results that were available during my care of the patient were reviewed by me and considered in my medical decision making (see chart for details).  Clinical Course as of 05/18/21 2051  Mon May 18, 2021  1750 CMP with worsening Cr from his baseline. He is now vomiting. Will give Zofran.  [CS]  1806 CXR is clear [CS]  [id number] Covid is confirmed positive. Dimer is neg.  [CS]  1922 Patient reports he is feeling a little bit better. Still weak and shaky but no longer diaphoretic. SpO2 still remains normal on RA. Will continue with LR and discuss admission with the hospitalist.  [CS]  1951 Spoke with Dr. Marlowe Sax, Hospitalist, who will accept for admission. [CS]    Clinical Course User Index [CS] Truddie Hidden, MD    Final Clinical Impression(s) / ED Diagnoses Final diagnoses:  COVID-19  AKI (acute kidney injury) [facility])    Rx / [facility] Orders ED Discharge Orders     None        Truddie Hidden, MD 05/18/21 2051

## 2021-05-19 ENCOUNTER — Observation Stay (HOSPITAL_COMMUNITY): Payer: 59

## 2021-05-19 ENCOUNTER — Encounter (HOSPITAL_COMMUNITY): Payer: Self-pay | Admitting: Internal Medicine

## 2021-05-19 ENCOUNTER — Ambulatory Visit: Payer: 59 | Admitting: Endocrinology

## 2021-05-19 DIAGNOSIS — Z83438 Family history of other disorder of lipoprotein metabolism and other lipidemia: Secondary | ICD-10-CM | POA: Diagnosis not present

## 2021-05-19 DIAGNOSIS — E1142 Type 2 diabetes mellitus with diabetic polyneuropathy: Secondary | ICD-10-CM | POA: Diagnosis present

## 2021-05-19 DIAGNOSIS — Z87891 Personal history of nicotine dependence: Secondary | ICD-10-CM | POA: Diagnosis not present

## 2021-05-19 DIAGNOSIS — E11319 Type 2 diabetes mellitus with unspecified diabetic retinopathy without macular edema: Secondary | ICD-10-CM | POA: Diagnosis present

## 2021-05-19 DIAGNOSIS — E1122 Type 2 diabetes mellitus with diabetic chronic kidney disease: Secondary | ICD-10-CM | POA: Diagnosis present

## 2021-05-19 DIAGNOSIS — Z818 Family history of other mental and behavioral disorders: Secondary | ICD-10-CM | POA: Diagnosis not present

## 2021-05-19 DIAGNOSIS — R112 Nausea with vomiting, unspecified: Secondary | ICD-10-CM | POA: Diagnosis present

## 2021-05-19 DIAGNOSIS — D631 Anemia in chronic kidney disease: Secondary | ICD-10-CM | POA: Diagnosis present

## 2021-05-19 DIAGNOSIS — I129 Hypertensive chronic kidney disease with stage 1 through stage 4 chronic kidney disease, or unspecified chronic kidney disease: Secondary | ICD-10-CM | POA: Diagnosis present

## 2021-05-19 DIAGNOSIS — U071 COVID-19: Secondary | ICD-10-CM | POA: Diagnosis present

## 2021-05-19 DIAGNOSIS — E860 Dehydration: Secondary | ICD-10-CM | POA: Diagnosis present

## 2021-05-19 DIAGNOSIS — Z79899 Other long term (current) drug therapy: Secondary | ICD-10-CM | POA: Diagnosis not present

## 2021-05-19 DIAGNOSIS — H3530 Unspecified macular degeneration: Secondary | ICD-10-CM | POA: Diagnosis present

## 2021-05-19 DIAGNOSIS — Z833 Family history of diabetes mellitus: Secondary | ICD-10-CM | POA: Diagnosis not present

## 2021-05-19 DIAGNOSIS — E785 Hyperlipidemia, unspecified: Secondary | ICD-10-CM | POA: Diagnosis present

## 2021-05-19 DIAGNOSIS — Z823 Family history of stroke: Secondary | ICD-10-CM | POA: Diagnosis not present

## 2021-05-19 DIAGNOSIS — R0602 Shortness of breath: Secondary | ICD-10-CM | POA: Diagnosis present

## 2021-05-19 DIAGNOSIS — D649 Anemia, unspecified: Secondary | ICD-10-CM

## 2021-05-19 DIAGNOSIS — R030 Elevated blood-pressure reading, without diagnosis of hypertension: Secondary | ICD-10-CM

## 2021-05-19 DIAGNOSIS — F411 Generalized anxiety disorder: Secondary | ICD-10-CM | POA: Diagnosis present

## 2021-05-19 DIAGNOSIS — D638 Anemia in other chronic diseases classified elsewhere: Secondary | ICD-10-CM | POA: Diagnosis present

## 2021-05-19 DIAGNOSIS — N179 Acute kidney failure, unspecified: Secondary | ICD-10-CM

## 2021-05-19 DIAGNOSIS — Z7984 Long term (current) use of oral hypoglycemic drugs: Secondary | ICD-10-CM | POA: Diagnosis not present

## 2021-05-19 DIAGNOSIS — F909 Attention-deficit hyperactivity disorder, unspecified type: Secondary | ICD-10-CM | POA: Diagnosis present

## 2021-05-19 DIAGNOSIS — N1831 Chronic kidney disease, stage 3a: Secondary | ICD-10-CM | POA: Diagnosis present

## 2021-05-19 DIAGNOSIS — F17210 Nicotine dependence, cigarettes, uncomplicated: Secondary | ICD-10-CM | POA: Diagnosis present

## 2021-05-19 LAB — CREATININE, URINE, RANDOM: Creatinine, Urine: 157.73 mg/dL

## 2021-05-19 LAB — RAPID URINE DRUG SCREEN, HOSP PERFORMED
Amphetamines: NOT DETECTED
Barbiturates: NOT DETECTED
Benzodiazepines: NOT DETECTED
Cocaine: NOT DETECTED
Opiates: POSITIVE — AB
Tetrahydrocannabinol: POSITIVE — AB

## 2021-05-19 LAB — BASIC METABOLIC PANEL
Anion gap: 12 (ref 5–15)
BUN: 37 mg/dL — ABNORMAL HIGH (ref 6–20)
CO2: 25 mmol/L (ref 22–32)
Calcium: 8.4 mg/dL — ABNORMAL LOW (ref 8.9–10.3)
Chloride: 104 mmol/L (ref 98–111)
Creatinine, Ser: 2.07 mg/dL — ABNORMAL HIGH (ref 0.61–1.24)
GFR, Estimated: 39 mL/min — ABNORMAL LOW (ref >60)
Glucose, Bld: 185 mg/dL — ABNORMAL HIGH (ref 70–99)
Potassium: 4.2 mmol/L (ref 3.5–5.1)
Sodium: 141 mmol/L (ref 135–145)

## 2021-05-19 LAB — LACTIC ACID, PLASMA: Lactic Acid, Venous: 1.1 mmol/L (ref 0.5–1.9)

## 2021-05-19 LAB — URINALYSIS, ROUTINE W REFLEX MICROSCOPIC
Bacteria, UA: NONE SEEN
Bilirubin Urine: NEGATIVE
Glucose, UA: 50 mg/dL — AB
Hgb urine dipstick: NEGATIVE
Ketones, ur: 5 mg/dL — AB
Leukocytes,Ua: NEGATIVE
Nitrite: NEGATIVE
Protein, ur: >=300 mg/dL — AB
Specific Gravity, Urine: 1.014 (ref 1.005–1.030)
pH: 5 (ref 5.0–8.0)

## 2021-05-19 LAB — HIV ANTIBODY (ROUTINE TESTING W REFLEX): HIV Screen 4th Generation wRfx: NONREACTIVE

## 2021-05-19 LAB — CBC
HCT: 28.1 % — ABNORMAL LOW (ref 39.0–52.0)
Hemoglobin: 9.5 g/dL — ABNORMAL LOW (ref 13.0–17.0)
MCH: 29.6 pg (ref 26.0–34.0)
MCHC: 33.8 g/dL (ref 30.0–36.0)
MCV: 87.5 fL (ref 80.0–100.0)
Platelets: 183 10*3/uL (ref 150–400)
RBC: 3.21 MIL/uL — ABNORMAL LOW (ref 4.22–5.81)
RDW: 11.9 % (ref 11.5–15.5)
WBC: 6.9 10*3/uL (ref 4.0–10.5)
nRBC: 0 % (ref 0.0–0.2)

## 2021-05-19 LAB — GLUCOSE, CAPILLARY
Glucose-Capillary: 185 mg/dL — ABNORMAL HIGH (ref 70–99)
Glucose-Capillary: 142 mg/dL — ABNORMAL HIGH (ref 70–99)
Glucose-Capillary: 142 mg/dL — ABNORMAL HIGH (ref 70–99)
Glucose-Capillary: 145 mg/dL — ABNORMAL HIGH (ref 70–99)
Glucose-Capillary: 246 mg/dL — ABNORMAL HIGH (ref 70–99)
Glucose-Capillary: 184 mg/dL — ABNORMAL HIGH (ref 70–99)

## 2021-05-19 LAB — SODIUM, URINE, RANDOM: Sodium, Ur: 34 mmol/L

## 2021-05-19 IMAGING — US US RENAL
1 series · 15 of 25 positions shown · non-contrast
Comparison: Ultrasound [DATE].

CLINICAL DATA: Acute renal injury.

EXAM:
RENAL / URINARY TRACT ULTRASOUND COMPLETE

[Series 1: us renal mc & wl · 15 of 32 slices shown]
[im 1/32]
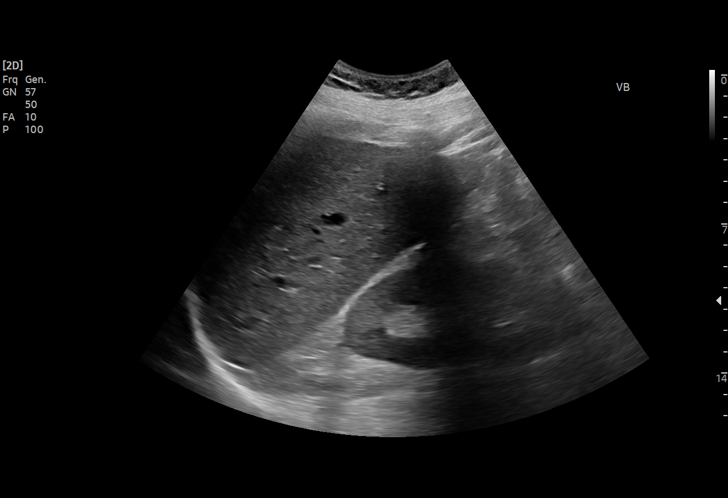
[im 3/32]
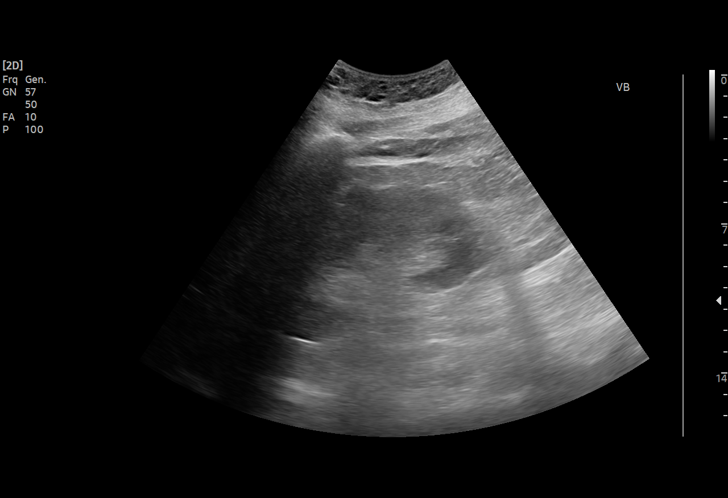
[im 6/32]
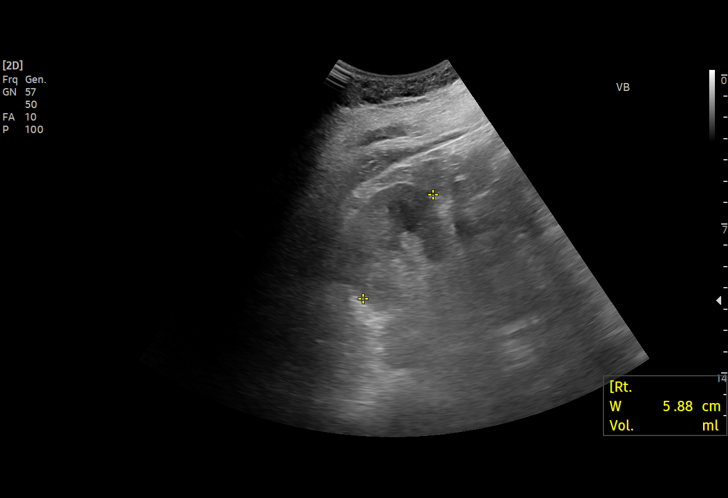
[im 7/32]
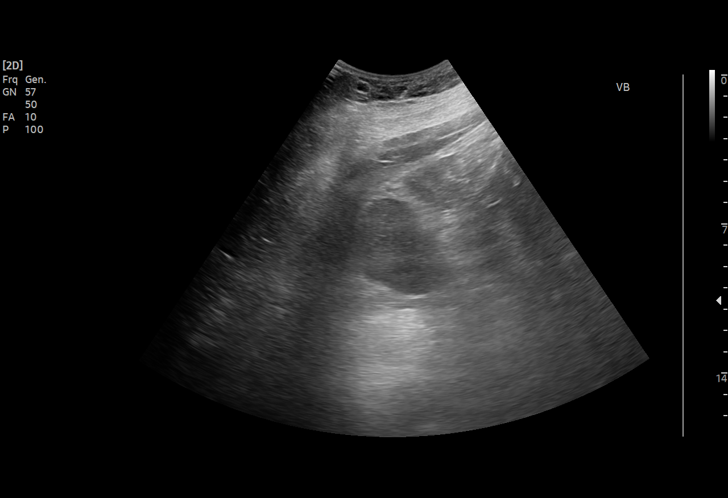
[im 10/32]
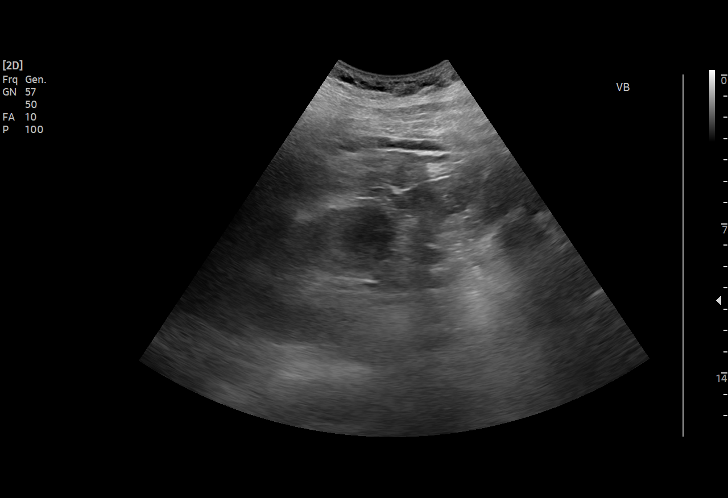
[im 12/32]
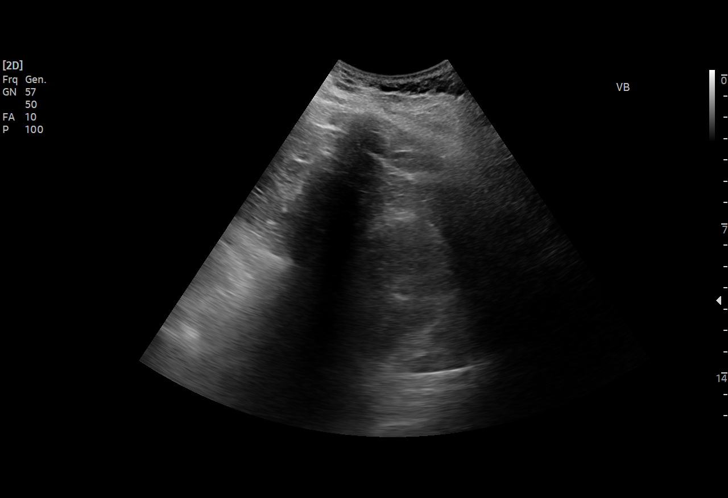
[im 13/32]
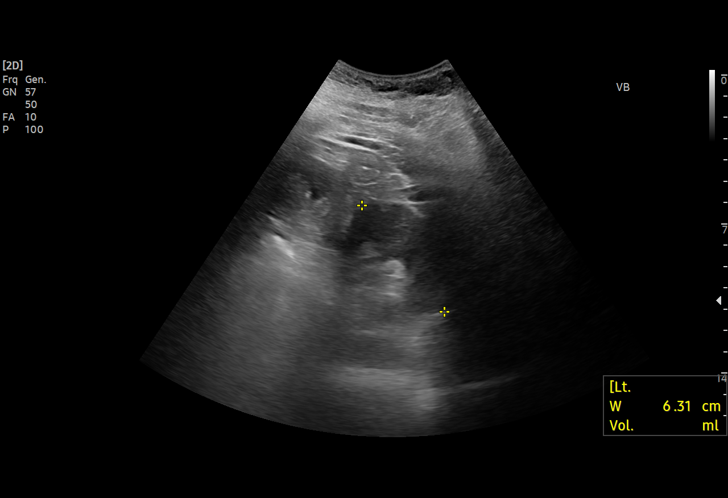
[im 16/32]
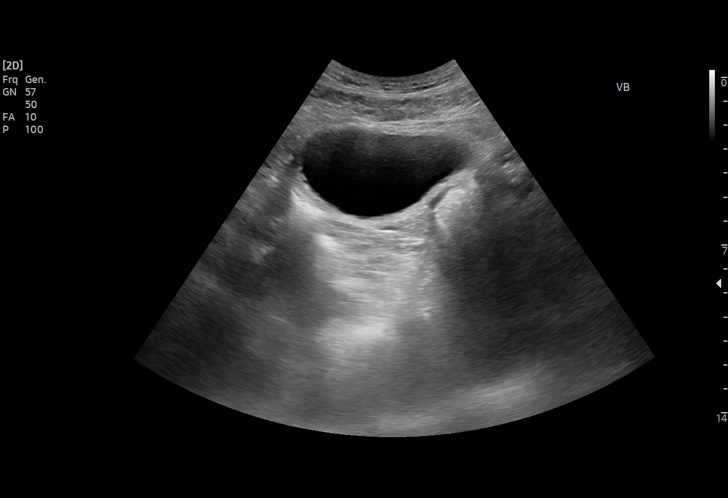
[im 19/32]
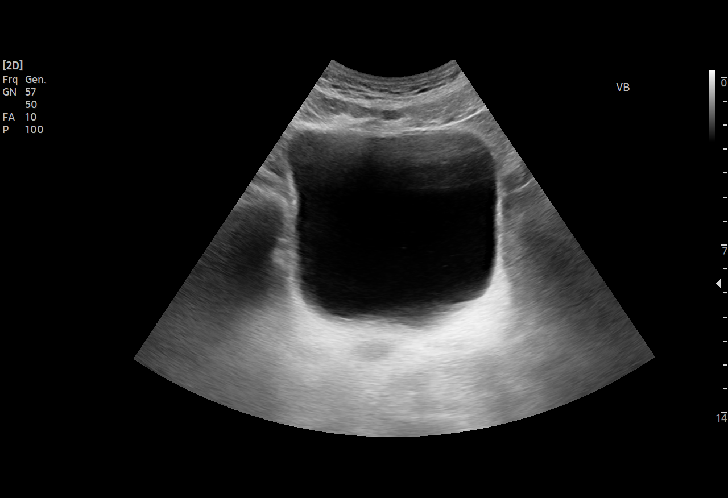
[im 20/32]
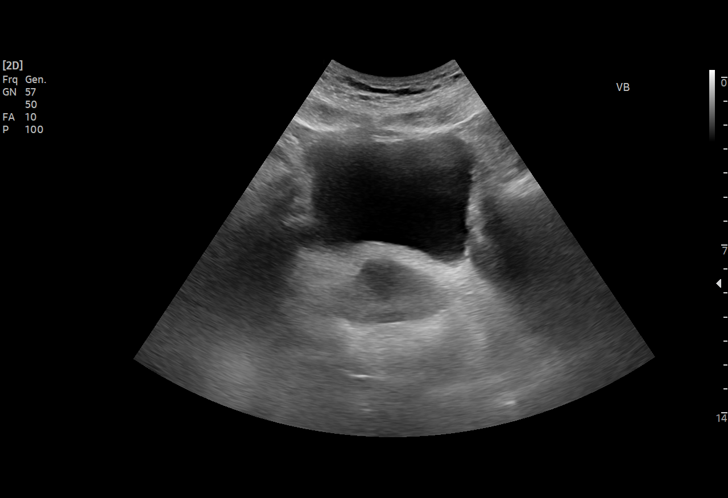
[im 22/32]
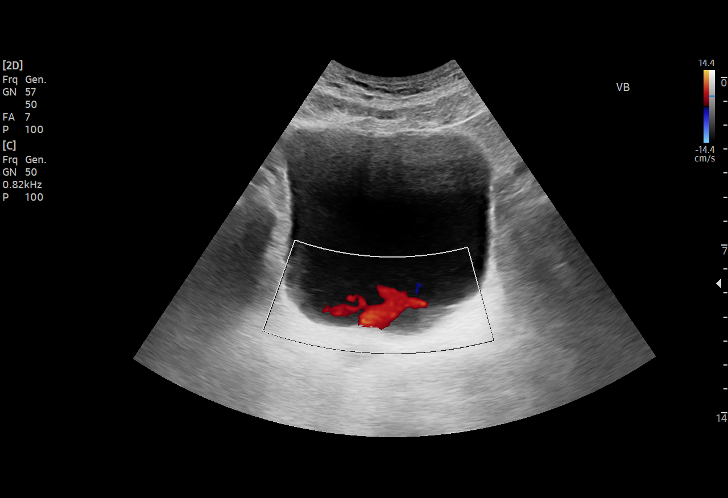
[im 25/32]
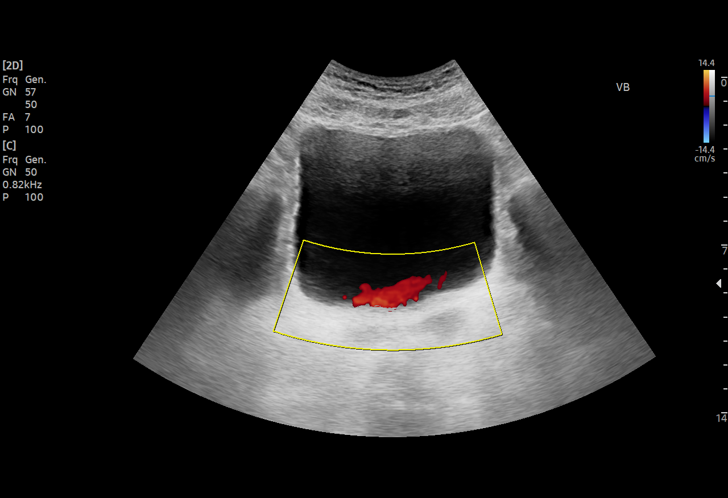
[im 26/32]
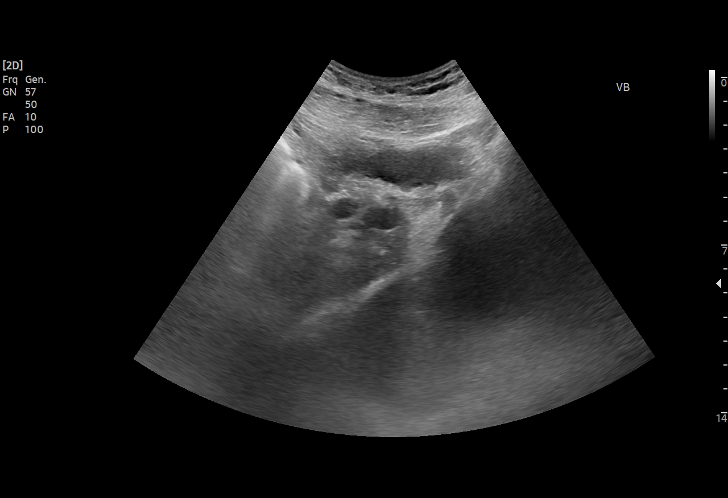
[im 29/32]
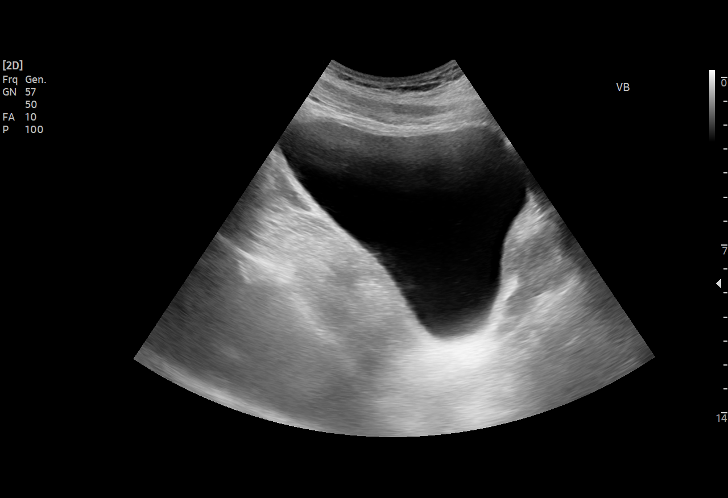
[im 32/32]
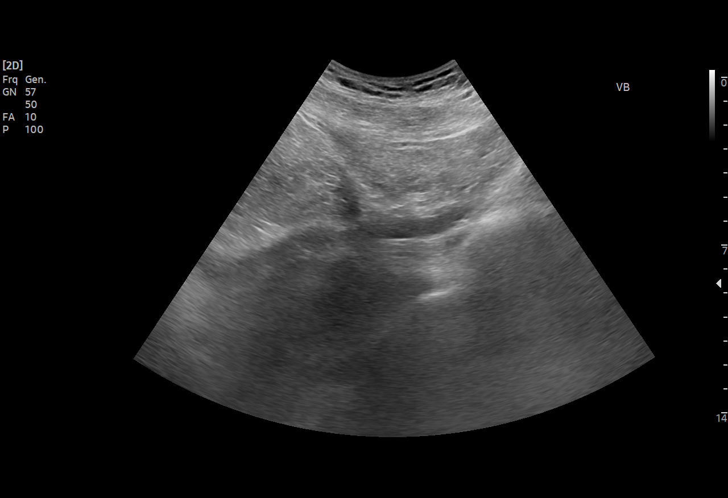

[15 of 25 positions shown; findings below may reference images not displayed]

FINDINGS: Right Kidney:

Renal measurements: 11.6 x 5.8 x 5.9 cm = volume: 2 in 6.8 mL.
Echogenicity within normal limits. No mass or hydronephrosis
visualized.

Left Kidney:

Renal measurements: 12.5 x 5.3 x 6.3 cm = volume: 220.0 mL.
Echogenicity within normal limits. No mass or hydronephrosis
visualized.

Bladder:

Appears normal for degree of bladder distention. Both ureteral jets
noted.

Other:

Exam limited by patient's mobility.
IMPRESSION: Negative exam. No acute abnormality. No hydronephrosis or bladder
distention.

## 2021-05-19 MED ORDER — LINAGLIPTIN 5 MG PO TABS
5.0000 mg | ORAL_TABLET | Freq: Every day | ORAL | Status: DC
  Administered 2021-05-19 – 2021-05-21 (×3): 5 mg via ORAL
  Filled 2021-05-19 (×3): qty 1

## 2021-05-19 MED ORDER — PROCHLORPERAZINE EDISYLATE 10 MG/2ML IJ SOLN
10.0000 mg | Freq: Once | INTRAMUSCULAR | Status: AC
  Administered 2021-05-19: 10 mg via INTRAVENOUS
  Filled 2021-05-19: qty 2

## 2021-05-19 MED ORDER — INSULIN ASPART 100 UNIT/ML IJ SOLN
0.0000 [IU] | Freq: Three times a day (TID) | INTRAMUSCULAR | Status: DC
  Administered 2021-05-19: 3 [IU] via SUBCUTANEOUS
  Administered 2021-05-19: 1 [IU] via SUBCUTANEOUS

## 2021-05-19 MED ORDER — PROSOURCE PLUS PO LIQD
30.0000 mL | Freq: Two times a day (BID) | ORAL | Status: DC
  Administered 2021-05-19 – 2021-05-20 (×3): 30 mL via ORAL
  Filled 2021-05-19 (×3): qty 30

## 2021-05-19 MED ORDER — METOPROLOL TARTRATE 5 MG/5ML IV SOLN
5.0000 mg | Freq: Three times a day (TID) | INTRAVENOUS | Status: DC
  Administered 2021-05-19 – 2021-05-21 (×7): 5 mg via INTRAVENOUS
  Filled 2021-05-19 (×7): qty 5

## 2021-05-19 MED ORDER — METHYLPREDNISOLONE SODIUM SUCC 125 MG IJ SOLR
60.0000 mg | Freq: Two times a day (BID) | INTRAMUSCULAR | Status: DC
  Administered 2021-05-19 – 2021-05-20 (×3): 60 mg via INTRAVENOUS
  Filled 2021-05-19 (×3): qty 2

## 2021-05-19 MED ORDER — ONDANSETRON HCL 4 MG/2ML IJ SOLN: 4.0000 mg | Freq: Four times a day (QID) | INTRAMUSCULAR | Status: DC | PRN

## 2021-05-19 MED ORDER — SENNOSIDES-DOCUSATE SODIUM 8.6-50 MG PO TABS: 1.0000 | ORAL_TABLET | Freq: Every evening | ORAL | Status: DC | PRN

## 2021-05-19 MED ORDER — BISACODYL 5 MG PO TBEC: 5.0000 mg | DELAYED_RELEASE_TABLET | Freq: Every day | ORAL | Status: DC | PRN

## 2021-05-19 MED ORDER — ASCORBIC ACID 500 MG PO TABS
500.0000 mg | ORAL_TABLET | Freq: Every day | ORAL | Status: DC
  Administered 2021-05-19 – 2021-05-21 (×3): 500 mg via ORAL
  Filled 2021-05-19 (×3): qty 1

## 2021-05-19 MED ORDER — MELATONIN 3 MG PO TABS
3.0000 mg | ORAL_TABLET | Freq: Every day | ORAL | Status: DC
  Administered 2021-05-19 – 2021-05-20 (×2): 3 mg via ORAL
  Filled 2021-05-19 (×3): qty 1

## 2021-05-19 MED ORDER — ALPRAZOLAM 0.25 MG PO TABS
0.2500 mg | ORAL_TABLET | Freq: Two times a day (BID) | ORAL | Status: DC | PRN
  Administered 2021-05-19 – 2021-05-20 (×2): 0.25 mg via ORAL
  Filled 2021-05-19 (×2): qty 1

## 2021-05-19 MED ORDER — FOLIC ACID 1 MG PO TABS
1.0000 mg | ORAL_TABLET | Freq: Every day | ORAL | Status: DC
  Administered 2021-05-19 – 2021-05-21 (×3): 1 mg via ORAL
  Filled 2021-05-19 (×3): qty 1

## 2021-05-19 MED ORDER — DULOXETINE HCL 60 MG PO CPEP
60.0000 mg | ORAL_CAPSULE | Freq: Every day | ORAL | Status: DC
  Administered 2021-05-19 – 2021-05-21 (×3): 60 mg via ORAL
  Filled 2021-05-19 (×4): qty 1

## 2021-05-19 MED ORDER — ENSURE ENLIVE PO LIQD
237.0000 mL | ORAL | Status: DC
  Administered 2021-05-20: 237 mL via ORAL

## 2021-05-19 MED ORDER — ONDANSETRON HCL 4 MG PO TABS: 4.0000 mg | ORAL_TABLET | Freq: Four times a day (QID) | ORAL | Status: DC | PRN

## 2021-05-19 MED ORDER — GUAIFENESIN-DM 100-10 MG/5ML PO SYRP: 10.0000 mL | ORAL_SOLUTION | ORAL | Status: DC | PRN

## 2021-05-19 MED ORDER — IPRATROPIUM-ALBUTEROL 20-100 MCG/ACT IN AERS
1.0000 | INHALATION_SPRAY | Freq: Four times a day (QID) | RESPIRATORY_TRACT | Status: DC
  Administered 2021-05-19 – 2021-05-20 (×4): 1 via RESPIRATORY_TRACT
  Filled 2021-05-19 (×2): qty 4

## 2021-05-19 MED ORDER — BOOST / RESOURCE BREEZE PO LIQD CUSTOM
1.0000 | Freq: Two times a day (BID) | ORAL | Status: DC
  Administered 2021-05-20 – 2021-05-21 (×2): 1 via ORAL

## 2021-05-19 MED ORDER — ENOXAPARIN SODIUM 40 MG/0.4ML IJ SOSY
40.0000 mg | PREFILLED_SYRINGE | INTRAMUSCULAR | Status: DC
  Administered 2021-05-19 – 2021-05-20 (×2): 40 mg via SUBCUTANEOUS
  Filled 2021-05-19 (×3): qty 0.4

## 2021-05-19 MED ORDER — ATORVASTATIN CALCIUM 40 MG PO TABS
40.0000 mg | ORAL_TABLET | Freq: Every day | ORAL | Status: DC
  Administered 2021-05-19 – 2021-05-21 (×3): 40 mg via ORAL
  Filled 2021-05-19 (×3): qty 1

## 2021-05-19 MED ORDER — ASCORBIC ACID 500 MG PO TABS: 500.0000 mg | ORAL_TABLET | Freq: Every day | ORAL | Status: DC

## 2021-05-19 MED ORDER — ALBUTEROL SULFATE HFA 108 (90 BASE) MCG/ACT IN AERS: 2.0000 | INHALATION_SPRAY | Freq: Four times a day (QID) | RESPIRATORY_TRACT | Status: DC

## 2021-05-19 MED ORDER — METOPROLOL TARTRATE 5 MG/5ML IV SOLN: 5.0000 mg | Freq: Four times a day (QID) | INTRAVENOUS | Status: DC | PRN

## 2021-05-19 MED ORDER — ONDANSETRON HCL 4 MG/2ML IJ SOLN
4.0000 mg | Freq: Once | INTRAMUSCULAR | Status: AC
  Administered 2021-05-19: 4 mg via INTRAVENOUS
  Filled 2021-05-19: qty 2

## 2021-05-19 MED ORDER — INSULIN ASPART 100 UNIT/ML IJ SOLN
0.0000 [IU] | Freq: Four times a day (QID) | INTRAMUSCULAR | Status: DC
  Administered 2021-05-19: 2 [IU] via SUBCUTANEOUS
  Administered 2021-05-20: 9 [IU] via SUBCUTANEOUS
  Administered 2021-05-20 (×2): 2 [IU] via SUBCUTANEOUS
  Administered 2021-05-21: 1 [IU] via SUBCUTANEOUS
  Administered 2021-05-21: 2 [IU] via SUBCUTANEOUS

## 2021-05-19 MED ORDER — TRAZODONE HCL 100 MG PO TABS
100.0000 mg | ORAL_TABLET | Freq: Every evening | ORAL | Status: DC | PRN
  Administered 2021-05-19 – 2021-05-20 (×2): 100 mg via ORAL
  Filled 2021-05-19 (×2): qty 1

## 2021-05-19 MED ORDER — HYDROCOD POLST-CPM POLST ER 10-8 MG/5ML PO SUER
5.0000 mL | Freq: Two times a day (BID) | ORAL | Status: DC | PRN
Start: 2021-05-19 — End: 2021-05-21

## 2021-05-19 MED ORDER — ZINC SULFATE 220 (50 ZN) MG PO CAPS
220.0000 mg | ORAL_CAPSULE | Freq: Every day | ORAL | Status: DC
  Administered 2021-05-19 – 2021-05-21 (×3): 220 mg via ORAL
  Filled 2021-05-19 (×4): qty 1

## 2021-05-19 MED ORDER — KETOROLAC TROMETHAMINE 30 MG/ML IJ SOLN
30.0000 mg | Freq: Once | INTRAMUSCULAR | Status: AC
  Administered 2021-05-19: 30 mg via INTRAVENOUS
  Filled 2021-05-19: qty 1

## 2021-05-19 MED ORDER — ADULT MULTIVITAMIN W/MINERALS CH
1.0000 | ORAL_TABLET | Freq: Every day | ORAL | Status: DC
  Administered 2021-05-19 – 2021-05-21 (×3): 1 via ORAL
  Filled 2021-05-19 (×3): qty 1

## 2021-05-19 MED ORDER — PANTOPRAZOLE SODIUM 40 MG PO TBEC
40.0000 mg | DELAYED_RELEASE_TABLET | Freq: Every day | ORAL | Status: DC
  Administered 2021-05-19 – 2021-05-21 (×3): 40 mg via ORAL
  Filled 2021-05-19 (×3): qty 1

## 2021-05-19 MED ORDER — GABAPENTIN 100 MG PO CAPS
100.0000 mg | ORAL_CAPSULE | Freq: Every day | ORAL | Status: DC
  Administered 2021-05-19 – 2021-05-20 (×3): 100 mg via ORAL
  Filled 2021-05-19 (×3): qty 1

## 2021-05-19 MED ORDER — ACETAMINOPHEN 650 MG RE SUPP: 650.0000 mg | Freq: Four times a day (QID) | RECTAL | Status: DC | PRN

## 2021-05-19 MED ORDER — ACETAMINOPHEN 325 MG PO TABS
650.0000 mg | ORAL_TABLET | Freq: Four times a day (QID) | ORAL | Status: DC | PRN
  Administered 2021-05-19: 650 mg via ORAL
  Filled 2021-05-19: qty 2

## 2021-05-19 MED ORDER — HYDROCODONE-ACETAMINOPHEN 5-325 MG PO TABS: 1.0000 | ORAL_TABLET | ORAL | Status: DC | PRN

## 2021-05-19 MED ORDER — THIAMINE HCL 100 MG PO TABS
100.0000 mg | ORAL_TABLET | Freq: Every day | ORAL | Status: DC
  Administered 2021-05-19 – 2021-05-21 (×3): 100 mg via ORAL
  Filled 2021-05-19 (×3): qty 1

## 2021-05-19 MED ORDER — HYDRALAZINE HCL 20 MG/ML IJ SOLN: 5.0000 mg | Freq: Four times a day (QID) | INTRAMUSCULAR | Status: DC | PRN

## 2021-05-19 MED ORDER — MORPHINE SULFATE (PF) 2 MG/ML IV SOLN
2.0000 mg | INTRAVENOUS | Status: DC | PRN
  Administered 2021-05-19: 2 mg via INTRAVENOUS
  Filled 2021-05-19: qty 1

## 2021-05-19 NOTE — Plan of Care (Signed)
The patient is admitted to 1435 with the diagnosis of Covid 93. A & O x 4. Denied any acute pain at this moment. Full assessment to epic completed. Will continue to monitor.

## 2021-05-19 NOTE — Progress Notes (Signed)
I have seen and assessed patient and I agree with Dr. Dena Billet assessment and plan.  Patient is a 45 year old gentleman history of diabetes presented to the ED on 05/18/2021 with shortness of breath, difficulty keeping anything down, shaking chills/rigors, fever, decreased oral intake, decreased urine output and noted to have a positive COVID-19 PCR test.  Patient noted to have had a positive COVID home test on 05/15/2021.  Patient had a televisit with PCP on 05/18/2021 and advised to present to the ED.  On presentation to the ED patient noted to be in acute renal failure with a creatinine of 2.35, tachycardic tachypneic but not hypoxic.  Check a urine sodium, urine creatinine.  Patient pancultured results pending.  Continue IV fluids, IV antiemetics, IV remdesivir.  Placed on IV Solu-Medrol 60 mg every 12 hours.  Resume home regimen Cymbalta.  Supportive care.  No charge.

## 2021-05-19 NOTE — H&P (Signed)
History and Physical    Jose [name] [id number] DOB: [birth year] DOA: 05/18/2021  PCP: Aretta Nip, MD  Patient coming from: Home  I have personally briefly reviewed patient's old medical records in [facility].  Chief Complaint: Covid positive, unable to keep anything down, and shortness of bfreath  HPI: Jose [name] is a 45 y.o. male with medical history significant for diabetes who presents to the emergency department on 05/19/2019 with positive home COVID test, shortness of breath, difficulty keeping anything down. Onset of patient's symptoms was 05/14/21 in the evening and duration is intermittent.He had a home covid test that was positive on 05/15/21. He had a televisit with his doctor on 05/18/21 and doctor advised that he go to the emergency department. He has had shaking chills/rigors and fever. Shortness of breath, with exertion and now even at rest. Vomiting 5 times per day, is not green or bloody.He had abdominal pain in the right upper and right lower quadrants, radiated to the back, was up to 4/10 at times and is characterized as crampy.  He did have an episode of chest heaviness that resolved. Symptoms are alleviated by nothing and exacerbated by nothing. Associated symptoms: Vomiting. Nausea. No diarrhea or bloody stool. No constipation. Coughing sometimes productive. If he starts to move then he starts coughing. Has had diaphotresis, fever, shaking chills that won't stop. Dercreased oral intake. Decreased urination, urinated only once in the last 24 hours. No hematuria. Headaches. Left leg numbness from knee to toes.  No focal weakness, slurred speech, or visual changes.  Muscle aches, joint pains.  Vaccination history: He has had 2 Covid vaccinatiions plus a booster.  ED Course: Patient was tachycardic and tachypneic but not hypoxic.  Creatinine is 2.35 with baseline 1.1-1.2.  Review of Systems: As per HPI otherwise all other systems reviewed and are  unremarable. INTEGUMENT: no rashes, itching, or new lesions.  GENITOURINARY: No dysuria or hematuria.    Past Medical History:  Diagnosis Date   GAD (generalized anxiety disorder)    Hyperlipidemia    Macular degeneration, bilateral    Retinopathy    Type II diabetes mellitus with complication, uncontrolled ([place])    retinopathy, neuropathy, microalbuminuria    Past Surgical History:  Procedure Laterality Date   APPENDECTOMY     HERNIA REPAIR     TRIGGER FINGER RELEASE Right 10/25/2019   Procedure: RIGHT INDEX FINGER RELEASE TRIGGER FINGER/A-1 PULLEY;  Surgeon: Daryll Brod, MD;  Location: [facility];  Service: Orthopedics;  Laterality: Right;  IV REGIONAL FOREARM BLOCK    Social History  reports that he quit smoking about 9 months ago. His smoking use included cigarettes. He has a 20.00 pack-year smoking history. He has never used smokeless tobacco. He reports previous alcohol use. He reports that he does not use drugs.  No Known Allergies  Family History  Problem Relation Age of Onset   Diabetes Mother    Hyperlipidemia Mother    Stroke Mother    Diabetes Father    Hyperlipidemia Brother    Stroke Brother    ADD / ADHD Brother    ADD / ADHD Son      Home Medications  Prior to Admission medications   Medication Sig Start Date End Date Taking? Authorizing Provider  ALPRAZolam (XANAX) 0.25 MG tablet Take 1 tablet (0.25 mg total) by mouth 2 (two) times daily as needed for anxiety. 03/05/21 06/03/21  Elvin So, MD  amphetamine-dextroamphetamine (ADDERALL) 20 MG tablet Take 1 tablet (  20 mg total) by mouth 2 (two) times daily. 03/05/21 04/04/21  Elvin So, MD  amphetamine-dextroamphetamine (ADDERALL) 20 MG tablet Take 1 tablet (20 mg total) by mouth 2 (two) times daily. 03/05/21 04/04/21  Elvin So, MD  amphetamine-dextroamphetamine (ADDERALL) 20 MG tablet Take 1 tablet (20 mg total) by mouth 2 (two) times daily. 03/05/21 04/04/21  Elvin So, MD   atorvastatin (LIPITOR) 40 MG tablet Take 1 tablet (40 mg total) by mouth daily. 03/12/19   Jacelyn Pi, Lilia Argue, MD  Dulaglutide (TRULICITY) 3 0000000 SOPN Inject 3 mg as directed once a week. 01/08/21   Renato Shin, MD  DULoxetine (CYMBALTA) 60 MG capsule TAKE 1 CAPSULE(60 MG) BY MOUTH DAILY 03/05/21   Elvin So, MD  gabapentin (NEURONTIN) 300 MG capsule TAKE 1 TO 2 CAPSULES(300 TO 600 MG) BY MOUTH AT BEDTIME 12/01/19   Jacelyn Pi, Lilia Argue, MD  glimepiride (AMARYL) 1 MG tablet Take 1 tablet (1 mg total) by mouth daily with breakfast. 01/08/21   Renato Shin, MD  losartan (COZAAR) 50 MG tablet TAKE 1 TABLET(50 MG) BY MOUTH DAILY 07/21/20   Jacelyn Pi, Lilia Argue, MD  metFORMIN (GLUCOPHAGE-XR) 500 MG 24 hr tablet Take 2 tablets (1,000 mg total) by mouth daily. 03/10/21   Renato Shin, MD  Ranibizumab (LUCENTIS) 0.3 MG/0.05ML SOLN by Intravitreal route.     [provider]  tadalafil (CIALIS) 20 MG tablet TK 1 T PO  PO QD PRN FOR ERECTILE DYSFUNCTION 07/20/19   Jacelyn Pi, Lilia Argue, MD  tobramycin (TOBREX) 0.3 % ophthalmic solution INSTILL 1 DROP IN BOTH EYES 4 TIMES A DAY BEGIN 1 DAY  PRIOR TO TREATMENT CONTINUE THE DAY OF TREATMENT AND ONE FULL DAY AFTER TREATMENT 07/05/18   [provider]  traMADol (ULTRAM) 50 MG tablet Take 1 tablet (50 mg total) by mouth every 6 (six) hours as needed. 10/25/19   Daryll Brod, MD  traZODone (DESYREL) 100 MG tablet TAKE 1 TABLET(100 MG) BY MOUTH AT BEDTIME AS NEEDED FOR SLEEP 03/05/21   Elvin So, MD    Physical Exam: Vitals:   05/18/21 1800 05/18/21 1900 05/18/21 2300 05/18/21 2350  BP: (!) 188/98 (!) 168/90 (!) 160/85 (!) 180/90  Pulse: 96 95 93 97  Resp: (!) '24 19  18  '$ Temp:    98.2 F (36.8 C)  TempSrc:    Oral  SpO2: 99% 99% 98% 99%    Constitutional: NAD, calm, ill-appearing, rigors noted. Vitals:   05/18/21 1800 05/18/21 1900 05/18/21 2300 05/18/21 2350  BP: (!) 188/98 (!) 168/90 (!) 160/85 (!) 180/90  Pulse: 96 95  93 97  Resp: (!) '24 19  18  '$ Temp:    98.2 F (36.8 C)  TempSrc:    Oral  SpO2: 99% 99% 98% 99%   Eyes: EOMI. pupils equal and round, lids and conjunctivae without icterus or erythema. ENMT: Mucous membranes are dry. Posterior pharynx clear of any exudate or lesions. Nares patent without discharge or bleeding.  Normocephalic, atraumatic.  Normal dentition.  Neck: normal, supple, no masses, trachea midline.  Thyroid nontender, no masses appreciated, no thyromegaly.  Full range of motion.  No nuchal rigidity.  Mild generalized tenderness bilaterally in the posterior neck. Respiratory: clear to auscultation bilaterally. Chest wall movements are symmetric. No wheezing, no crackles.  No rhonchi.  No accessory muscle use.  Intermittent tachypnea. Cardiovascular: Regular rhythm, no murmurs / rubs / gallops.  Mild tachycardia.  Pulses: DP pulses 2+ bilaterally. No carotid bruits.  Capillary refill less  than 3 seconds. Edema: None bilaterally. GI: soft, non-distended, normal active bowel sounds. No hepatosplenomegaly. No rigidity, rebound, or guarding. Non-tender. No masses palpated.  Mild right-sided tenderness.  Mild right CVA tenderness.  No left CVA tenderness. Musculoskeletal: no clubbing / cyanosis. No joint deformity upper and lower extremities. Good ROM, no contractures. Normal muscle tone.  No deformity in the back bilaterally.  Mild tenderness in the lower back right greater than left.  No tenderness in the lower extremities bilaterally. Integument: no rashes, lesions, ulcers. No induration. Clean, dry, intact. Neurologic: CN 2-12 grossly intact. Sensation grossly intact to light touch, except decreased in the right lower extremity from knee to toes. DTR 2+ bilaterally.  Babinski: Toes downgoing bilaterally.  Strength 5/5 in all 4.  Intact rapid alternating movements bilaterally.  No pronator drift.  Intact finger-to-nose and heel-to-shin bilaterally.  No slurred speech. Psychiatric: Normal judgment  and insight. Alert and oriented x 3. Normal mood.  Normal and appropriate affect. Lymphatic: No cervical lymphadenopathy. No supraclavicular lymphadenopathy.   Labs on Admission: I have personally reviewed the following labs and imaging studies.  CBC: Recent Labs  Lab 05/18/21 1703  WBC 6.9  HGB 11.2*  HCT 32.5*  MCV 84.9  PLT [id number]    Basic Metabolic Panel: Recent Labs  Lab 05/18/21 1703  NA 137  K 4.3  CL 100  CO2 22  GLUCOSE 190*  BUN 29*  CREATININE 2.35*  CALCIUM 8.8*    GFR: CrCl cannot be calculated (Unknown ideal weight.).  Liver Function Tests: Recent Labs  Lab 05/18/21 1703  AST 24  ALT 26  ALKPHOS 90  BILITOT 0.7  PROT 8.1  ALBUMIN 4.4    Urine analysis:    Component Value Date/Time   APPEARANCEUR Clear 05/05/2018 1211   GLUCOSEU 3+ (A) 05/05/2018 1211   BILIRUBINUR negative 09/12/2018 0919   BILIRUBINUR Negative 05/05/2018 1211   KETONESUR negative 09/12/2018 0919   PROTEINUR =100 (A) 09/12/2018 0919   PROTEINUR 2+ (A) 05/05/2018 1211   UROBILINOGEN 1.0 09/12/2018 0919   NITRITE Negative 09/12/2018 0919   NITRITE Negative 05/05/2018 1211   LEUKOCYTESUR Negative 09/12/2018 0919   LEUKOCYTESUR Negative 05/05/2018 1211    Radiological Exams on Admission: DG Chest Port 1 View  Result Date: 05/18/2021 CLINICAL DATA:  COVID positive EXAM: PORTABLE CHEST 1 VIEW COMPARISON:  None. FINDINGS: The heart size and mediastinal contours are within normal limits. No focal airspace consolidation. The visualized skeletal structures are unremarkable. IMPRESSION: No evidence of acute cardiopulmonary disease. Electronically Signed   By: Maurine Simmering   On: 05/18/2021 18:01    EKG: Independently reviewed.  107 bpm.  Sinus tachycardia.  Assessment/Plan Principal Problem:   COVID-19 Active Problems:   Type 2 diabetes mellitus with diabetic polyneuropathy, without long-term current use of insulin (HCC)   AKI (acute kidney injury) ([place])   Anemia   Nausea and  vomiting   GAD (generalized anxiety disorder)   Numbness   Elevated blood pressure reading    Principal Problem:    COVID-19 Plan: COVID order set. Remdesivir. If patient becomes hypoxic, then add oxygen by nasal cannula and consider steroids.   Active Problems:    AKI (acute kidney injury) [facility]) Plan: Check labs.  Avoid nephrotoxins.  Give IV fluids.    Type 2 diabetes mellitus with diabetic polyneuropathy, without long-term current use of insulin (HCC) Plan: Hold oral diabetes medications.  Check fingerstick blood sugars q ac and hs.  Sliding scale insulin.  Anemia Plan: Monitor for any bleeding.  Recheck in the a.m.    Nausea and vomiting Plan: As needed IV antiemetics.    Numbness Patient has baseline peripheral neuropathy from his diabetes but currently his right lower extremity numbness has increased to the point of his knee.  May be related to acute illness no other focal neurologic findings. Plan: Continue home gabapentin but reduce dose due to kidney function. Monitor.    Elevated blood pressure reading Plan: Hold home losartan due to acute kidney injury.  Give as needed IV metoprolol.    GAD (generalized anxiety disorder) Plan: Continue home medication.   DVT prophylaxis: Lovenox.  Code Status:   Full Code   Disposition Plan:   Patient is from:  Home  Anticipated DC to:  Home  Anticipated DC date:  05/19/2021  Anticipated DC barriers: Continued acute kidney injury  Consults called:  None  Admission status:  Observation   Severity of Illness: The appropriate patient status for this patient is OBSERVATION. Observation status is judged to be reasonable and necessary in order to provide the required intensity of service to ensure the patient's safety. The patient's presenting symptoms, physical exam findings, and initial radiographic and laboratory data in the context of their medical condition is felt to place them at decreased risk for further  clinical deterioration. Furthermore, it is anticipated that the patient will be medically stable for discharge from the hospital within 2 midnights of admission. The following factors support the patient status of observation.   " The patient's presenting symptoms include shortness of breath, shaking chills, vomiting, decreased oral intake. " The physical exam findings include tachypnea, tachycardia, rigors. " The initial radiographic and laboratory data are notable for elevated creatinine.    Tacey Ruiz MD [facility]  How to contact the [facility] Attending or Consulting provider [place] or covering provider during after hours [place], for this patient?   Check the care team in [facility] and look for a) attending/consulting [facility] provider listed and b) the [facility] team listed Log into [email or web address] and use [place]'s universal password to access. If you do not have the password, please contact the hospital operator. Locate the [facility] provider you are looking for under [facility] and page to a number that you can be directly reached. If you still have difficulty reaching the provider, please page the [facility] (Director on Call) for the Hospitalists listed on amion for assistance.  05/19/2021, 3:23 AM

## 2021-05-19 NOTE — Progress Notes (Signed)
Initial Nutrition Assessment  DOCUMENTATION CODES:   Not applicable  INTERVENTION:  - will order Boost Breeze BID, each supplement provides 250 kcal and 9 grams of protein. - will order 30 ml Prosource Plus BID, each supplement provides 100 kcal and 15 grams protein.  - will order Ensure Enlive once/day for once diet advanced to at least FLD, each supplement provides 350 kcal and 20 grams of protein. - weigh patient today. - complete NFPE when feasible.   NUTRITION DIAGNOSIS:   Increased nutrient needs related to acute illness, catabolic illness ([id number] infection) as evidenced by estimated needs.  GOAL:   Patient will meet greater than or equal to 90% of their needs  MONITOR:   PO intake, Supplement acceptance, Labs, Weight trends  REASON FOR ASSESSMENT:   Malnutrition Screening Tool  ASSESSMENT:   45 y.o. male with medical history of type 2 DM, HLD, generalized anxiety disorder, retinopathy, and bilateral macular degeneration. He presented to the ED on 7/25 after testing positive for COVID at home on 7/22. He was experiencing shortness of breath and difficulty keeping anything down since 7/21. He was also experiencing fevers, chills/rigors, shaking, and RUQ and RLQ abdominal pain.  Patient unavailable d/t out for ultrasound. He is noted to be Observation status. Diet advanced from NPO to Regular today at 0420 and then downgraded to CLD at 1011. No intakes documented.   He has not been weighed since 03/10/21 when he weighed 201 lb. Weight on 01/08/21 was 209 lb. This indicates 8 lb weight loss (3.8% body weight) which is not significant for that 2 month time frame.   No information documented in the edema section of flow sheet.  H&P note  outlines that patient reported vomiting up to 5 times/day from 7/21-7/25. He reported that R-sided abdominal pain radiated to his back and was crampy in nature. He indicated that nothing relieved or worsened his symptoms.  Per notes: -  COVID-19 infection - AKI - N/V PTA - numbness to RLE--thought to be related to peripheral neuropathy hx d/t DM   Labs reviewed; CBGs: 185, 142, 142, 145 mg/dl, BUN: 37 mg/dl, creatinine: 2.07 mg/dl, Ca: 8.4 mg/dl, GFR: 39 ml/min. Medications reviewed; 500 mg ascorbic acid/day, 1 mg folvite/day, sliding scale novolog, 3 mg melatonin/day, 60 mg solu-medrol BID, 1 tablet multivitamin with minerals/day, 40 mg protonix/day, 100 mg IV remdesivir x1 dose/day 7/26-7/29, 100 mg oral thiamine/day, 220 mg zinc sulfate/day.     NUTRITION - FOCUSED PHYSICAL EXAM:  Unable to complete at this time.  Diet Order:   Diet Order             Diet clear liquid Room service appropriate? Yes; Fluid consistency: Thin  Diet effective now                   EDUCATION NEEDS:   No education needs have been identified at this time  Skin:  Skin Assessment: Reviewed RN Assessment  Last BM:  7/25 (per patient, PTA)  Height:   Ht Readings from Last 1 Encounters:  03/10/21 '5\' 11"'$  (1.803 m)    Weight:   Wt Readings from Last 1 Encounters:  03/10/21 91.3 kg     Estimated Nutritional Needs:  Kcal:  2200-2400 kcal Protein:  110-130 grams Fluid:  >/= 2.7 L/day       Jarome Matin, MS, RD, LDN, CNSC Inpatient Clinical Dietitian RD pager # available in [facility]  After hours/weekend pager # available in [facility]

## 2021-05-20 ENCOUNTER — Ambulatory Visit: Payer: 59 | Admitting: Podiatry

## 2021-05-20 DIAGNOSIS — U071 COVID-19: Secondary | ICD-10-CM | POA: Diagnosis not present

## 2021-05-20 LAB — GLUCOSE, CAPILLARY
Glucose-Capillary: 195 mg/dL — ABNORMAL HIGH (ref 70–99)
Glucose-Capillary: 187 mg/dL — ABNORMAL HIGH (ref 70–99)
Glucose-Capillary: 192 mg/dL — ABNORMAL HIGH (ref 70–99)
Glucose-Capillary: 370 mg/dL — ABNORMAL HIGH (ref 70–99)

## 2021-05-20 LAB — IRON AND TIBC
Iron: 59 ug/dL (ref 45–182)
Saturation Ratios: 24 % (ref 17.9–39.5)
TIBC: 250 ug/dL (ref 250–450)
UIBC: 191 ug/dL

## 2021-05-20 LAB — HEMOGLOBIN A1C
Hgb A1c MFr Bld: 7.7 % — ABNORMAL HIGH (ref 4.8–5.6)
Mean Plasma Glucose: 174 mg/dL

## 2021-05-20 LAB — CBC WITH DIFFERENTIAL/PLATELET
Abs Immature Granulocytes: 0.03 10*3/uL (ref 0.00–0.07)
Basophils Absolute: 0 10*3/uL (ref 0.0–0.1)
Basophils Relative: 0 %
Eosinophils Absolute: 0 10*3/uL (ref 0.0–0.5)
Eosinophils Relative: 0 %
HCT: 26.7 % — ABNORMAL LOW (ref 39.0–52.0)
Hemoglobin: 9 g/dL — ABNORMAL LOW (ref 13.0–17.0)
Immature Granulocytes: 0 %
Lymphocytes Relative: 17 %
Lymphs Abs: 1.2 10*3/uL (ref 0.7–4.0)
MCH: 29.4 pg (ref 26.0–34.0)
MCHC: 33.7 g/dL (ref 30.0–36.0)
MCV: 87.3 fL (ref 80.0–100.0)
Monocytes Absolute: 0.3 10*3/uL (ref 0.1–1.0)
Monocytes Relative: 5 %
Neutro Abs: 5.5 10*3/uL (ref 1.7–7.7)
Neutrophils Relative %: 78 %
Platelets: 167 10*3/uL (ref 150–400)
RBC: 3.06 MIL/uL — ABNORMAL LOW (ref 4.22–5.81)
RDW: 11.6 % (ref 11.5–15.5)
WBC: 7.1 10*3/uL (ref 4.0–10.5)
nRBC: 0 % (ref 0.0–0.2)

## 2021-05-20 LAB — COMPREHENSIVE METABOLIC PANEL
ALT: 19 U/L (ref 0–44)
AST: 17 U/L (ref 15–41)
Albumin: 3.4 g/dL — ABNORMAL LOW (ref 3.5–5.0)
Alkaline Phosphatase: 69 U/L (ref 38–126)
Anion gap: 11 (ref 5–15)
BUN: 32 mg/dL — ABNORMAL HIGH (ref 6–20)
CO2: 23 mmol/L (ref 22–32)
Calcium: 8.4 mg/dL — ABNORMAL LOW (ref 8.9–10.3)
Chloride: 104 mmol/L (ref 98–111)
Creatinine, Ser: 1.34 mg/dL — ABNORMAL HIGH (ref 0.61–1.24)
GFR, Estimated: >60 mL/min (ref >60)
Glucose, Bld: 186 mg/dL — ABNORMAL HIGH (ref 70–99)
Potassium: 4.5 mmol/L (ref 3.5–5.1)
Sodium: 138 mmol/L (ref 135–145)
Total Bilirubin: 0.7 mg/dL (ref 0.3–1.2)
Total Protein: 6.6 g/dL (ref 6.5–8.1)

## 2021-05-20 LAB — D-DIMER, QUANTITATIVE: D-Dimer, Quant: 0.33 ug/mL-FEU (ref 0.00–0.50)

## 2021-05-20 LAB — FOLATE: Folate: 43.8 ng/mL (ref >5.9)

## 2021-05-20 LAB — PROTEIN / CREATININE RATIO, URINE
Creatinine, Urine: 161.47 mg/dL
Protein Creatinine Ratio: 0.97 mg/mg{Cre} — ABNORMAL HIGH (ref 0.00–0.15)
Total Protein, Urine: 107 mg/dL

## 2021-05-20 LAB — PHOSPHORUS: Phosphorus: 3.7 mg/dL (ref 2.5–4.6)

## 2021-05-20 LAB — C-REACTIVE PROTEIN: CRP: 0.9 mg/dL (ref <1.0)

## 2021-05-20 LAB — VITAMIN B12: Vitamin B-12: 1734 pg/mL — ABNORMAL HIGH (ref 180–914)

## 2021-05-20 LAB — FERRITIN: Ferritin: 126 ng/mL (ref 24–336)

## 2021-05-20 LAB — MAGNESIUM: Magnesium: 2.4 mg/dL (ref 1.7–2.4)

## 2021-05-20 MED ORDER — METHYLPREDNISOLONE SODIUM SUCC 40 MG IJ SOLR
40.0000 mg | Freq: Every day | INTRAMUSCULAR | Status: DC
  Administered 2021-05-21: 40 mg via INTRAVENOUS
  Filled 2021-05-20 (×2): qty 1

## 2021-05-20 MED ORDER — IPRATROPIUM-ALBUTEROL 20-100 MCG/ACT IN AERS: 1.0000 | INHALATION_SPRAY | Freq: Four times a day (QID) | RESPIRATORY_TRACT | Status: DC | PRN

## 2021-05-20 NOTE — TOC Progression Note (Signed)
Transition of Care [facility]) - Progression Note    Patient Details  Name: Jose [name] MRN: [id number] Date of Birth: 1975/12/12  [facility] of [facility]) CM/SW Contact  Purcell Mouton, RN Phone Number: 05/20/2021, 2:56 PM  Clinical Narrative:     Pt from home with husband and plan to return home with no needs at present time.  Expected Discharge Plan: Home/Self Care Barriers to Discharge: No Barriers Identified  Expected Discharge Plan and Services Expected Discharge Plan: Home/Self Care     Post Acute Care Choice: Nursing Home Living arrangements for the past 2 months: Single Family Home                                       Social Determinants of Health (SDOH) Interventions    Readmission Risk Interventions No flowsheet data found.

## 2021-05-20 NOTE — Progress Notes (Signed)
PROGRESS NOTE    Jose [name]  [id number] DOB: 1976/01/07 DOA: 05/18/2021 PCP: Aretta Nip, MD    Brief Narrative:  45 year old gentleman with history of type 2 diabetes on injectables, hypertension, generalized anxiety disorder, presents to the emergency room with shortness of breath, ongoing nausea and vomiting after diagnosed with COVID-19 at home on 7/22.  Also complains of shaking chills and rigors.  Multiple times vomiting. In the emergency room he was tachycardic and tachypneic but not hypoxic.  Creatinine was 2.35.  Chest x-ray was normal.  Assessment & Plan:   Principal Problem:   COVID-19 Active Problems:   Type 2 diabetes mellitus with diabetic polyneuropathy, without long-term current use of insulin (HCC)   GAD (generalized anxiety disorder)   AKI (acute kidney injury) (HCC)   Numbness   Anemia   Nausea and vomiting   Elevated blood pressure reading  COVID-19 viral infection with GI symptoms: Patient with no obvious pulmonary symptoms.  Normal inflammatory markers.  With obvious nausea vomiting and poor food tolerance causing renal failure. Treated with IV fluids with clinical and biochemical improvement.  Tolerating clears. Discontinue IV fluids today.  Challenge with regular diet and monitor intake. Patient was started on high-dose IV steroids, will taper off quickly. No evidence of bacterial infection, will discontinue antibiotics. Reasonable to continue remdesivir for 3 days given hospitalization with symptoms.  Acute kidney injury with history of chronic kidney disease stage IIIa: Baseline creatinine 1.1-1.2.  Probably due to combination of dehydration, poor oral intake, ongoing use of losartan and metformin.  Already improving.  Recheck tomorrow morning.  Encourage oral hydration.  Stop IV fluids and monitor.  Type 2 diabetes with neuropathy: On gabapentin.  Continue.  Essential hypertension: Losartan on hold due to acute kidney injury.  On as  needed medications that is stable.  Generalized anxiety disorder and ADHD: On Xanax and Adderall that he will continue.  Discontinue telemetry.  Challenge with regular diet.  Mobilize.  Incentive spirometry and decreased exercise.  DVT prophylaxis: enoxaparin (LOVENOX) injection 40 mg Start: 05/19/21 1000 SCDs Start: 05/19/21 0419   Code Status: Full code Family Communication: None at the bedside Disposition Plan: Status is: Inpatient  Remains inpatient appropriate because:Inpatient level of care appropriate due to severity of illness  Dispo: The patient is from: Home              Anticipated d/c is to: Home              Patient currently is not medically stable to d/c.   Difficult to place patient No         Consultants:  None  Procedures:  None  Antimicrobials:  Remdesivir 7/26---   Subjective: Patient seen and examined.  Denies any nausea vomiting.  He wants to try eating a meal.  Passive congestion cardiology shortness of breath.  Afebrile.  Objective: Vitals:   05/19/21 2330 05/20/21 0634 05/20/21 1120 05/20/21 1200  BP: (!) 144/79 (!) 159/89 (!) 170/85 (!) 166/87  Pulse: 75 81 88 87  Resp:  16 18   Temp:  97.7 F (36.5 C) 97.8 F (36.6 C)   TempSrc:  Oral Oral   SpO2:  96% 99%   Weight:  89.1 kg      Intake/Output Summary (Last 24 hours) at 05/20/2021 1431 Last data filed at 05/20/2021 1300 Gross per 24 hour  Intake 700 ml  Output 1975 ml  Net -1275 ml   Filed Weights   05/20/21 0634  Weight:  89.1 kg    Examination:  General exam: Appears calm and comfortable, mildly anxious. Respiratory system: Clear to auscultation. Respiratory effort normal. Cardiovascular system: S1 & S2 heard, RRR. No JVD, murmurs, rubs, gallops or clicks. No pedal edema. Gastrointestinal system: Soft.  Nontender. Central nervous system: Alert and oriented. No focal neurological deficits. Extremities: Symmetric 5 x 5 power. Skin: No rashes, lesions or  ulcers Psychiatry: Judgement and insight appear normal.  Mildly anxious.    Data Reviewed: I have personally reviewed following labs and imaging studies  CBC: Recent Labs  Lab 05/18/21 1703 05/19/21 0431 05/20/21 0359  WBC 6.9 6.9 7.1  NEUTROABS  --   --  5.5  HGB 11.2* 9.5* 9.0*  HCT 32.5* 28.1* 26.7*  MCV 84.9 87.5 87.3  PLT 214 183 [id number]   Basic Metabolic Panel: Recent Labs  Lab 05/18/21 1703 05/19/21 0431 05/20/21 0359  NA 137 141 138  K 4.3 4.2 4.5  CL 100 104 104  CO2 '22 25 23  '$ GLUCOSE 190* 185* 186*  BUN 29* 37* 32*  CREATININE 2.35* 2.07* 1.34*  CALCIUM 8.8* 8.4* 8.4*  MG  --   --  2.4  PHOS  --   --  3.7   GFR: Estimated Creatinine Clearance: 72.6 mL/min (A) (by C-G formula based on SCr of 1.34 mg/dL (H)). Liver Function Tests: Recent Labs  Lab 05/18/21 1703 05/20/21 0359  AST 24 17  ALT 26 19  ALKPHOS 90 69  BILITOT 0.7 0.7  PROT 8.1 6.6  ALBUMIN 4.4 3.4*   No results for input(s): LIPASE, AMYLASE in the last 168 hours. No results for input(s): AMMONIA in the last 168 hours. Coagulation Profile: No results for input(s): INR, PROTIME in the last 168 hours. Cardiac Enzymes: No results for input(s): CKTOTAL, CKMB, CKMBINDEX, TROPONINI in the last 168 hours. BNP (last 3 results) No results for input(s): PROBNP in the last 8760 hours. HbA1C: Recent Labs    05/19/21 0431  HGBA1C 7.7*   CBG: Recent Labs  Lab 05/19/21 1752 05/19/21 2329 05/20/21 0638 05/20/21 0748 05/20/21 1118  GLUCAP 246* 184* 195* 187* 192*   Lipid Profile: Recent Labs    05/18/21 1703  TRIG 140   Thyroid Function Tests: No results for input(s): TSH, T4TOTAL, FREET4, T3FREE, THYROIDAB in the last 72 hours. Anemia Panel: Recent Labs    05/18/21 1703 05/20/21 0359  VITAMINB12  --  1,734*  FOLATE  --  43.8  FERRITIN 167 126  TIBC  --  250  IRON  --  59   Sepsis Labs: Recent Labs  Lab 05/18/21 1703 05/19/21 0032  PROCALCITON <0.10  --   LATICACIDVEN  1.5 1.1    Recent Results (from the past 240 hour(s))  Resp Panel by RT-PCR (Flu A&B, Covid) Nasopharyngeal Swab     Status: Abnormal   Collection Time: 05/18/21  5:03 PM   Specimen: Nasopharyngeal Swab; Nasopharyngeal(NP) swabs in vial transport medium  Result Value Ref Range Status   SARS Coronavirus 2 by RT PCR POSITIVE (A) NEGATIVE Final    Comment: RESULT CALLED TO, READ BACK BY AND VERIFIED WITH: CALLED TO P DOWD,RN 1807 05/18/21 DBRADLEY (NOTE) SARS-CoV-2 target nucleic acids are DETECTED.  The SARS-CoV-2 RNA is generally detectable in upper respiratory specimens during the acute phase of infection. Positive results are indicative of the presence of the identified virus, but do not rule out bacterial infection or co-infection with other pathogens not detected by the test. Clinical correlation with patient history and  other diagnostic information is necessary to determine patient infection status. The expected result is Negative.  Fact Sheet for Patients: EntrepreneurPulse.com.au  Fact Sheet for Healthcare Providers: IncredibleEmployment.be  This test is not yet approved or cleared by the [place] FDA and  has been authorized for detection and/or diagnosis of SARS-CoV-2 by FDA under an Emergency Use Authorization (EUA).  This EUA will remain in effect (meaning this tes t can be used) for the duration of  the COVID-19 declaration under Section 564(b)(1) of the Act, 21 U.S.C. section 360bbb-3(b)(1), unless the authorization is terminated or revoked sooner.     Influenza A by PCR NEGATIVE NEGATIVE Final   Influenza B by PCR NEGATIVE NEGATIVE Final    Comment: (NOTE) The Xpert Xpress SARS-CoV-2/FLU/RSV plus assay is intended as an aid in the diagnosis of influenza from Nasopharyngeal swab specimens and should not be used as a sole basis for treatment. Nasal washings and aspirates are unacceptable for Xpert Xpress  SARS-CoV-2/FLU/RSV testing.  Fact Sheet for Patients: EntrepreneurPulse.com.au  Fact Sheet for Healthcare Providers: IncredibleEmployment.be  This test is not yet approved or cleared by the [place] FDA and has been authorized for detection and/or diagnosis of SARS-CoV-2 by FDA under an Emergency Use Authorization (EUA). This EUA will remain in effect (meaning this test can be used) for the duration of the COVID-19 declaration under Section 564(b)(1) of the Act, 21 U.S.C. section 360bbb-3(b)(1), unless the authorization is terminated or revoked.  Performed at [facility], [address], [place], [place] [address]   Blood Culture (routine x 2)     Status: None (Preliminary result)   Collection Time: 05/18/21  5:03 PM   Specimen: BLOOD  Result Value Ref Range Status   Specimen Description   Final    BLOOD RIGHT ANTECUBITAL Performed at [facility], [address] [place], [place] [address]    Special Requests   Final    Blood Culture adequate volume BOTTLES DRAWN AEROBIC AND ANAEROBIC Performed at [facility], [address], [place], [place] [address]    Culture   Final    NO GROWTH 2 DAYS Performed at [facility], [place] [address], [place], [place] [address]    Report Status PENDING  Incomplete  Blood Culture (routine x 2)     Status: None (Preliminary result)   Collection Time: 05/18/21  5:08 PM   Specimen: BLOOD  Result Value Ref Range Status   Specimen Description   Final    BLOOD BLOOD LEFT WRIST Performed at [facility], [address] [place], [place] [address]    Special Requests   Final    Blood Culture adequate volume BOTTLES DRAWN AEROBIC ONLY Performed at [facility], [address] [place] [address] [place], [place] [address]    Culture   Final    NO GROWTH 2 DAYS Performed at [facility], [place] [address]  [place],  [address]    Report Status PENDING  Incomplete         Radiology Studies: US RENAL  Result Date: 05/19/2021 CLINICAL DATA:  Acute renal injury. EXAM: RENAL / URINARY TRACT ULTRASOUND COMPLETE COMPARISON:  Ultrasound 01/12/2021. FINDINGS: Right Kidney: Renal measurements: 11.6 x 5.8 x 5.9 cm = volume: 2 in 6.8 mL. Echogenicity within normal limits. No mass or hydronephrosis visualized. Left Kidney: Renal measurements: 12.5 x 5.3 x 6.3 cm = volume: 220.0 mL. Echogenicity within normal limits. No mass or hydronephrosis visualized. Bladder: Appears normal for degree  of bladder distention. Both ureteral jets noted. Other: Exam limited by patient's mobility. IMPRESSION: Negative exam. No acute abnormality. No hydronephrosis or bladder distention. Electronically Signed   By: Marcello Moores  Register   On: 05/19/2021 09:24   DG Chest Port 1 View  Result Date: 05/18/2021 CLINICAL DATA:  COVID positive EXAM: PORTABLE CHEST 1 VIEW COMPARISON:  None. FINDINGS: The heart size and mediastinal contours are within normal limits. No focal airspace consolidation. The visualized skeletal structures are unremarkable. IMPRESSION: No evidence of acute cardiopulmonary disease. Electronically Signed   By: Maurine Simmering   On: 05/18/2021 18:01        Scheduled Meds:  (feeding supplement) PROSource Plus  30 mL Oral BID BM   vitamin C  500 mg Oral Daily   atorvastatin  40 mg Oral Daily   DULoxetine  60 mg Oral Daily   enoxaparin (LOVENOX) injection  40 mg Subcutaneous Q24H   feeding supplement  1 Container Oral BID BM   feeding supplement  237 mL Oral [id number]   folic acid  1 mg Oral Daily   gabapentin  100 mg Oral QHS   insulin aspart  0-9 Units Subcutaneous Q6H   Ipratropium-Albuterol  1 puff Inhalation Q6H   linagliptin  5 mg Oral Daily   melatonin  3 mg Oral QHS   [START ON 05/21/2021] methylPREDNISolone (SOLU-MEDROL) injection  40 mg Intravenous Daily   metoprolol tartrate  5 mg Intravenous Q8H    multivitamin with minerals  1 tablet Oral Daily   pantoprazole  40 mg Oral Daily   thiamine  100 mg Oral Daily   zinc sulfate  220 mg Oral Daily   Continuous Infusions:  lactated ringers     remdesivir 100 mg in NS 100 mL 100 mg (05/20/21 0919)     LOS: 1 day    Time spent: 25 minutes    Barb Merino, MD [facility] Pager [phone]

## 2021-05-20 NOTE — Plan of Care (Signed)
  Problem: Education: Goal: Knowledge of General Education information will improve Description: Including pain rating scale, medication(s)/side effects and non-pharmacologic comfort measures Outcome: Progressing   Problem: Health Behavior/Discharge Planning: Goal: Ability to manage health-related needs will improve Outcome: Progressing   Problem: Clinical Measurements: Goal: Ability to maintain clinical measurements within normal limits will improve Outcome: Progressing Goal: Will remain free from infection Outcome: Progressing Goal: Diagnostic test results will improve Outcome: Progressing Goal: Respiratory complications will improve Outcome: Progressing Goal: Cardiovascular complication will be avoided Outcome: Progressing   Problem: Activity: Goal: Risk for activity intolerance will decrease Outcome: Progressing   Problem: Nutrition: Goal: Adequate nutrition will be maintained Outcome: Progressing   Problem: Coping: Goal: Level of anxiety will decrease Outcome: Progressing   Problem: Pain Managment: Goal: General experience of comfort will improve Outcome: Progressing   Problem: Safety: Goal: Ability to remain free from injury will improve Outcome: Progressing   Problem: Skin Integrity: Goal: Risk for impaired skin integrity will decrease Outcome: Progressing   Problem: Education: Goal: Knowledge of risk factors and measures for prevention of condition will improve Outcome: Progressing   Problem: Coping: Goal: Psychosocial and spiritual needs will be supported Outcome: Progressing   Problem: Respiratory: Goal: Will maintain a patent airway Outcome: Progressing Goal: Complications related to the disease process, condition or treatment will be avoided or minimized Outcome: Progressing   

## 2021-05-21 DIAGNOSIS — U071 COVID-19: Secondary | ICD-10-CM | POA: Diagnosis not present

## 2021-05-21 LAB — URINE CULTURE: Culture: <10000 — AB

## 2021-05-21 LAB — GLUCOSE, CAPILLARY
Glucose-Capillary: 141 mg/dL — ABNORMAL HIGH (ref 70–99)
Glucose-Capillary: 182 mg/dL — ABNORMAL HIGH (ref 70–99)
Glucose-Capillary: 219 mg/dL — ABNORMAL HIGH (ref 70–99)

## 2021-05-21 LAB — BASIC METABOLIC PANEL
Anion gap: 5 (ref 5–15)
BUN: 35 mg/dL — ABNORMAL HIGH (ref 6–20)
CO2: 27 mmol/L (ref 22–32)
Calcium: 7.8 mg/dL — ABNORMAL LOW (ref 8.9–10.3)
Chloride: 104 mmol/L (ref 98–111)
Creatinine, Ser: 1.55 mg/dL — ABNORMAL HIGH (ref 0.61–1.24)
GFR, Estimated: 55 mL/min — ABNORMAL LOW (ref >60)
Glucose, Bld: 141 mg/dL — ABNORMAL HIGH (ref 70–99)
Potassium: 3.9 mmol/L (ref 3.5–5.1)
Sodium: 136 mmol/L (ref 135–145)

## 2021-05-21 MED ORDER — DEXAMETHASONE 4 MG PO TABS: 4.0000 mg | ORAL_TABLET | Freq: Every day | ORAL | 0 refills | Status: AC

## 2021-05-21 NOTE — Discharge Summary (Signed)
Physician Discharge Summary  AADAN ZEMPEL [id number] DOB: [birth year] DOA: 05/18/2021  PCP: Aretta Nip, MD  Admit date: 05/18/2021 Discharge date: 05/21/2021  Admitted From: Home Disposition: Home  Recommendations for Outpatient Follow-up:  Follow up with PCP in 1-2 weeks Please obtain BMP/CBC in one week You will hold off on taking Lasix, losartan and metformin for about a week.  Home Health: Not needed Equipment/Devices: None needed  Discharge Condition: Stable CODE STATUS: Full code Diet recommendation: Low-salt and low-carb diet.  Adequate hydration.  Discharge summary: 45 year old gentleman with history of type 2 diabetes on injectables and metformin, hypertension, generalized anxiety disorder, presented to the emergency room with shortness of breath, ongoing nausea and vomiting after diagnosed with COVID-19 at home on 7/22.  Also complains of shaking chills and rigors.  Multiple times vomiting. In the emergency room he was tachycardic and tachypneic but not hypoxic.  Creatinine was 2.35.  Chest x-ray was normal.  # COVID-19 viral infection with GI symptoms: Patient with no obvious pulmonary symptoms.  Normal inflammatory markers.  With obvious nausea vomiting and poor food tolerance causing renal failure. Treated with IV fluids with clinical and biochemical improvement.   Symptomatically treated.  Now with normal bowel function and eating regular diet and adequate liquids.  Initially on high-dose IV steroids, there is no indication to continue high-dose steroids, will gradually taper off and stop. Patient did receive remdesivir in the hospital, day 3 out of 3 today. Home with over-the-counter supportive treatment.  # Acute kidney injury with history of chronic kidney disease stage IIIa: Baseline creatinine 1.1-1.2.  Probably due to combination of dehydration, poor oral intake, ongoing use of losartan and metformin.  Already improving.   Encourage oral hydration.    Creatinine is 1.5 today.  Adequate oral intake.  Continue to hold losartan, Lasix and metformin for about a week. Please recheck in 1 week to ensure stabilization.  # Type 2 diabetes with neuropathy: On gabapentin.  Continue.Patient is on Trulicity and glipizide that he will continue.  He is on metformin, advised to hold for about a week given dehydration.  # Generalized anxiety disorder and ADHD: On Xanax and Adderall that he will continue.  Patient is medically stabilized.  Most of the symptoms improved today.  Able to go home with outpatient follow-up.  He has bilateral toes with skin grafts that he will follow-up with his podiatrist.   Discharge Diagnoses:  Principal Problem:   COVID-19 Active Problems:   Type 2 diabetes mellitus with diabetic polyneuropathy, without long-term current use of insulin (HCC)   GAD (generalized anxiety disorder)   AKI (acute kidney injury) (HCC)   Numbness   Anemia   Nausea and vomiting   Elevated blood pressure reading    Discharge Instructions  Discharge Instructions     Call MD for:  difficulty breathing, headache or visual disturbances   Complete by: As directed    Call MD for:  temperature >100.4   Complete by: As directed    Diet - low sodium heart healthy   Complete by: As directed    Diet Carb Modified   Complete by: As directed    Discharge instructions   Complete by: As directed    Hold taking Lasix, Losartan and metformin for one week.  Isolation precautions for total 10 days from your diagnosis.   Increase activity slowly   Complete by: As directed       Allergies as of 05/21/2021   No Known Allergies  Medication List     STOP taking these medications    Lagevrio 200 MG Caps Generic drug: Molnupiravir   torsemide 20 MG tablet Commonly known as: DEMADEX       TAKE these medications    acetaminophen 500 MG tablet Commonly known as: TYLENOL Take 1,000 mg by mouth every 6 (six) hours as needed for mild  pain, fever or headache.   ALPRAZolam 0.25 MG tablet Commonly known as: XANAX Take 1 tablet (0.25 mg total) by mouth 2 (two) times daily as needed for anxiety.   amLODipine 5 MG tablet Commonly known as: NORVASC Take 5 mg by mouth daily.   amphetamine-dextroamphetamine 20 MG tablet Commonly known as: Adderall Take 1 tablet (20 mg total) by mouth 2 (two) times daily.   atorvastatin 40 MG tablet Commonly known as: LIPITOR Take 1 tablet (40 mg total) by mouth daily.   benzonatate 100 MG capsule Commonly known as: TESSALON Take 100-200 mg by mouth 3 (three) times daily as needed for cough.   dexamethasone 4 MG tablet Commonly known as: Decadron Take 1 tablet (4 mg total) by mouth daily for 5 days.   DULoxetine 60 MG capsule Commonly known as: CYMBALTA TAKE 1 CAPSULE(60 MG) BY MOUTH DAILY What changed:  how much to take how to take this when to take this additional instructions   furosemide 20 MG tablet Commonly known as: LASIX Take 20 mg by mouth daily.   gabapentin 300 MG capsule Commonly known as: NEURONTIN TAKE 1 TO 2 CAPSULES(300 TO 600 MG) BY MOUTH AT BEDTIME What changed: See the new instructions.   glimepiride 1 MG tablet Commonly known as: AMARYL Take 1 tablet (1 mg total) by mouth daily with breakfast.   losartan 50 MG tablet Commonly known as: COZAAR TAKE 1 TABLET(50 MG) BY MOUTH DAILY What changed: See the new instructions.   Lucentis 0.3 MG/0.05ML Soln Generic drug: Ranibizumab by Intravitreal route.   metFORMIN 500 MG 24 hr tablet Commonly known as: GLUCOPHAGE-XR Take 2 tablets (1,000 mg total) by mouth daily.   MULTIVITAMIN ADULT PO Take 1 tablet by mouth daily.   Promethazine HCl 6.25 MG/5ML Soln Take 10 mLs by mouth every 6 (six) hours as needed (nausea).   tadalafil 20 MG tablet Commonly known as: CIALIS TK 1 T PO  PO QD PRN FOR ERECTILE DYSFUNCTION What changed:  how much to take how to take this when to take this reasons to take  this additional instructions   tobramycin 0.3 % ophthalmic solution Commonly known as: TOBREX Place 1 drop into both eyes in the morning, at noon, in the evening, and at bedtime. Begin 1 day prior to treatment and continue the day of treatment and for one full day after treatment.   traZODone 100 MG tablet Commonly known as: DESYREL TAKE 1 TABLET(100 MG) BY MOUTH AT BEDTIME AS NEEDED FOR SLEEP What changed:  how much to take how to take this when to take this additional instructions   Trulicity 3 0000000 Sopn Generic drug: Dulaglutide Inject 3 mg as directed once a week. What changed: additional instructions   verapamil 120 MG CR tablet Commonly known as: CALAN-SR Take 120 mg by mouth daily.   VITAMIN C PO Take 1 tablet by mouth daily.   VITAMIN D PO Take 1 tablet by mouth daily.        Follow-up Information     Rankins, Bill Salinas, MD Follow up.   Specialty: Family Medicine Contact information: [place]  [place] [address] [phone]                No Known Allergies  Consultations: None   Procedures/Studies: US RENAL  Result Date: 05/19/2021 CLINICAL DATA:  Acute renal injury. EXAM: RENAL / URINARY TRACT ULTRASOUND COMPLETE COMPARISON:  Ultrasound 01/12/2021. FINDINGS: Right Kidney: Renal measurements: 11.6 x 5.8 x 5.9 cm = volume: 2 in 6.8 mL. Echogenicity within normal limits. No mass or hydronephrosis visualized. Left Kidney: Renal measurements: 12.5 x 5.3 x 6.3 cm = volume: 220.0 mL. Echogenicity within normal limits. No mass or hydronephrosis visualized. Bladder: Appears normal for degree of bladder distention. Both ureteral jets noted. Other: Exam limited by patient's mobility. IMPRESSION: Negative exam. No acute abnormality. No hydronephrosis or bladder distention. Electronically Signed   By: Marcello Moores  Register   On: 05/19/2021 09:24   DG Chest Port 1 View  Result Date: 05/18/2021 CLINICAL DATA:  COVID positive EXAM: PORTABLE  CHEST 1 VIEW COMPARISON:  None. FINDINGS: The heart size and mediastinal contours are within normal limits. No focal airspace consolidation. The visualized skeletal structures are unremarkable. IMPRESSION: No evidence of acute cardiopulmonary disease. Electronically Signed   By: Maurine Simmering   On: 05/18/2021 18:01   (Echo, Carotid, EGD, Colonoscopy, ERCP)    Subjective: Patient seen and examined.  No overnight events.  Has some dry cough but afebrile.  Denies any nausea vomiting abdominal pain.  Able to eat and drink normally.   Discharge Exam: Vitals:   05/20/21 2202 05/21/21 0618  BP: (!) 166/82 (!) 149/80  Pulse: 90 78  Resp: 18 20  Temp: 98.2 F (36.8 C) 97.7 F (36.5 C)  SpO2: 99% 96%   Vitals:   05/20/21 1200 05/20/21 2202 05/21/21 0600 05/21/21 0618  BP: (!) 166/87 (!) 166/82  (!) 149/80  Pulse: 87 90  78  Resp:  18  20  Temp:  98.2 F (36.8 C)  97.7 F (36.5 C)  TempSrc:  Oral  Oral  SpO2:  99%  96%  Weight:   88.2 kg     General: Pt is alert, awake, not in acute distress On room air. Cardiovascular: RRR, S1/S2 +, no rubs, no gallops Respiratory: CTA bilaterally, no wheezing, no rhonchi Abdominal: Soft, NT, ND, bowel sounds + Extremities: no edema, no cyanosis Postop surgical dressing on both toes intact and dry.    The results of significant diagnostics from this hospitalization (including imaging, microbiology, ancillary and laboratory) are listed below for reference.     Microbiology: Recent Results (from the past 240 hour(s))  Resp Panel by RT-PCR (Flu A&B, Covid) Nasopharyngeal Swab     Status: Abnormal   Collection Time: 05/18/21  5:03 PM   Specimen: Nasopharyngeal Swab; Nasopharyngeal(NP) swabs in vial transport medium  Result Value Ref Range Status   SARS Coronavirus 2 by RT PCR POSITIVE (A) NEGATIVE Final    Comment: RESULT CALLED TO, READ BACK BY AND VERIFIED WITH: CALLED TO P DOWD,RN 1807 05/18/21 DBRADLEY (NOTE) SARS-CoV-2 target nucleic acids  are DETECTED.  The SARS-CoV-2 RNA is generally detectable in upper respiratory specimens during the acute phase of infection. Positive results are indicative of the presence of the identified virus, but do not rule out bacterial infection or co-infection with other pathogens not detected by the test. Clinical correlation with patient history and other diagnostic information is necessary to determine patient infection status. The expected result is Negative.  Fact Sheet for Patients: EntrepreneurPulse.com.au  Fact Sheet for Healthcare Providers: IncredibleEmployment.be  This test  is not yet approved or cleared by the [place] and  has been authorized for detection and/or diagnosis of SARS-CoV-2 by FDA under an Emergency Use Authorization (EUA).  This EUA will remain in effect (meaning this tes t can be used) for the duration of  the COVID-19 declaration under Section 564(b)(1) of the Act, 21 U.S.C. section 360bbb-3(b)(1), unless the authorization is terminated or revoked sooner.     Influenza A by PCR NEGATIVE NEGATIVE Final   Influenza B by PCR NEGATIVE NEGATIVE Final    Comment: (NOTE) The Xpert Xpress SARS-CoV-2/FLU/RSV plus assay is intended as an aid in the diagnosis of influenza from Nasopharyngeal swab specimens and should not be used as a sole basis for treatment. Nasal washings and aspirates are unacceptable for Xpert Xpress SARS-CoV-2/FLU/RSV testing.  Fact Sheet for Patients: EntrepreneurPulse.com.au  Fact Sheet for Healthcare Providers: IncredibleEmployment.be  This test is not yet approved or cleared by the [place] FDA and has been authorized for detection and/or diagnosis of SARS-CoV-2 by FDA under an Emergency Use Authorization (EUA). This EUA will remain in effect (meaning this test can be used) for the duration of the COVID-19 declaration under Section 564(b)(1) of the Act,  21 U.S.C. section 360bbb-3(b)(1), unless the authorization is terminated or revoked.  Performed at [facility], [address], [place], [place] [address]   Blood Culture (routine x 2)     Status: None (Preliminary result)   Collection Time: 05/18/21  5:03 PM   Specimen: BLOOD  Result Value Ref Range Status   Specimen Description   Final    BLOOD RIGHT ANTECUBITAL Performed at [facility], [address], [place], [place] [address]    Special Requests   Final    Blood Culture adequate volume BOTTLES DRAWN AEROBIC AND ANAEROBIC Performed at [facility], [address] [place], [place] [address]    Culture   Final    NO GROWTH 2 DAYS Performed at [facility], [place] [address] [place], [place] [address]    Report Status PENDING  Incomplete  Blood Culture (routine x 2)     Status: None (Preliminary result)   Collection Time: 05/18/21  5:08 PM   Specimen: BLOOD  Result Value Ref Range Status   Specimen Description   Final    BLOOD BLOOD LEFT WRIST Performed at [facility], [address], [place], [place] [address]    Special Requests   Final    Blood Culture adequate volume BOTTLES DRAWN AEROBIC ONLY Performed at [facility], [address] [place], [place] [address]    Culture   Final    NO GROWTH 2 DAYS Performed at [facility], [place] [address] [place], [place] [address]    Report Status PENDING  Incomplete  Urine Culture     Status: Abnormal   Collection Time: 05/19/21  2:47 PM   Specimen: Urine, Clean Catch  Result Value Ref Range Status   Specimen Description   Final    URINE, CLEAN CATCH Performed at [facility], [place] [address], [place], [place] [address]    Special Requests   Final    NONE Performed at [facility], [place] [address], [place], [place] [address]    Culture (A)  Final    <10,000 COLONIES/mL  INSIGNIFICANT GROWTH Performed at [facility] [address] [place],  [address]    Report Status 05/21/2021 FINAL  Final  Labs: BNP (last 3 results) No results for input(s): BNP in the last 8760 hours. Basic Metabolic Panel: Recent Labs  Lab 05/18/21 1703 05/19/21 0431 05/20/21 0359 05/21/21 0406  NA 137 141 138 136  K 4.3 4.2 4.5 3.9  CL 100 104 104 104  CO2 '22 25 23 27  '$ GLUCOSE 190* 185* 186* 141*  BUN 29* 37* 32* 35*  CREATININE 2.35* 2.07* 1.34* 1.55*  CALCIUM 8.8* 8.4* 8.4* 7.8*  MG  --   --  2.4  --   PHOS  --   --  3.7  --    Liver Function Tests: Recent Labs  Lab 05/18/21 1703 05/20/21 0359  AST 24 17  ALT 26 19  ALKPHOS 90 69  BILITOT 0.7 0.7  PROT 8.1 6.6  ALBUMIN 4.4 3.4*   No results for input(s): LIPASE, AMYLASE in the last 168 hours. No results for input(s): AMMONIA in the last 168 hours. CBC: Recent Labs  Lab 05/18/21 1703 05/19/21 0431 05/20/21 0359  WBC 6.9 6.9 7.1  NEUTROABS  --   --  5.5  HGB 11.2* 9.5* 9.0*  HCT 32.5* 28.1* 26.7*  MCV 84.9 87.5 87.3  PLT 214 183 167   Cardiac Enzymes: No results for input(s): CKTOTAL, CKMB, CKMBINDEX, TROPONINI in the last 168 hours. BNP: Invalid input(s): POCBNP CBG: Recent Labs  Lab 05/20/21 0748 05/20/21 1118 05/20/21 1616 05/21/21 0006 05/21/21 0616  GLUCAP 187* 192* 370* 182* 141*   D-Dimer Recent Labs    05/18/21 1703 05/20/21 0359  DDIMER <0.27 0.33   Hgb A1c Recent Labs    05/19/21 0431  HGBA1C 7.7*   Lipid Profile Recent Labs    05/18/21 1703  TRIG 140   Thyroid function studies No results for input(s): TSH, T4TOTAL, T3FREE, THYROIDAB in the last 72 hours.  Invalid input(s): FREET3 Anemia work up Recent Labs    05/18/21 1703 05/20/21 0359  VITAMINB12  --  1,734*  FOLATE  --  43.8  FERRITIN 167 126  TIBC  --  250  IRON  --  59   Urinalysis    Component Value Date/Time   COLORURINE YELLOW 05/19/2021 1447   APPEARANCEUR CLEAR  05/19/2021 1447   APPEARANCEUR Clear 05/05/2018 1211   LABSPEC 1.014 05/19/2021 1447   PHURINE 5.0 05/19/2021 1447   GLUCOSEU 50 (A) 05/19/2021 1447   HGBUR NEGATIVE 05/19/2021 1447   [place] 05/19/2021 1447   BILIRUBINUR negative 09/12/2018 0919   BILIRUBINUR Negative 05/05/2018 1211   KETONESUR 5 (A) 05/19/2021 1447   PROTEINUR >=300 (A) 05/19/2021 1447   UROBILINOGEN 1.0 09/12/2018 0919   NITRITE NEGATIVE 05/19/2021 1447   LEUKOCYTESUR NEGATIVE 05/19/2021 1447   Sepsis Labs Invalid input(s): PROCALCITONIN,  WBC,  LACTICIDVEN Microbiology Recent Results (from the past 240 hour(s))  Resp Panel by RT-PCR (Flu A&B, Covid) Nasopharyngeal Swab     Status: Abnormal   Collection Time: 05/18/21  5:03 PM   Specimen: Nasopharyngeal Swab; Nasopharyngeal(NP) swabs in vial transport medium  Result Value Ref Range Status   SARS Coronavirus 2 by RT PCR POSITIVE (A) NEGATIVE Final    Comment: RESULT CALLED TO, READ BACK BY AND VERIFIED WITH: CALLED TO P DOWD,RN 1807 05/18/21 DBRADLEY (NOTE) SARS-CoV-2 target nucleic acids are DETECTED.  The SARS-CoV-2 RNA is generally detectable in upper respiratory specimens during the acute phase of infection. Positive results are indicative of the presence of the identified virus, but do not rule out bacterial infection or co-infection with other pathogens not detected  by the test. Clinical correlation with patient history and other diagnostic information is necessary to determine patient infection status. The expected result is Negative.  Fact Sheet for Patients: EntrepreneurPulse.com.au  Fact Sheet for Healthcare Providers: IncredibleEmployment.be  This test is not yet approved or cleared by the [place] FDA and  has been authorized for detection and/or diagnosis of SARS-CoV-2 by FDA under an Emergency Use Authorization (EUA).  This EUA will remain in effect (meaning this tes t can be used) for  the duration of  the COVID-19 declaration under Section 564(b)(1) of the Act, 21 U.S.C. section 360bbb-3(b)(1), unless the authorization is terminated or revoked sooner.     Influenza A by PCR NEGATIVE NEGATIVE Final   Influenza B by PCR NEGATIVE NEGATIVE Final    Comment: (NOTE) The Xpert Xpress SARS-CoV-2/FLU/RSV plus assay is intended as an aid in the diagnosis of influenza from Nasopharyngeal swab specimens and should not be used as a sole basis for treatment. Nasal washings and aspirates are unacceptable for Xpert Xpress SARS-CoV-2/FLU/RSV testing.  Fact Sheet for Patients: EntrepreneurPulse.com.au  Fact Sheet for Healthcare Providers: IncredibleEmployment.be  This test is not yet approved or cleared by the [place] FDA and has been authorized for detection and/or diagnosis of SARS-CoV-2 by FDA under an Emergency Use Authorization (EUA). This EUA will remain in effect (meaning this test can be used) for the duration of the COVID-19 declaration under Section 564(b)(1) of the Act, 21 U.S.C. section 360bbb-3(b)(1), unless the authorization is terminated or revoked.  Performed at [facility], [address] [place], [place] [address]   Blood Culture (routine x 2)     Status: None (Preliminary result)   Collection Time: 05/18/21  5:03 PM   Specimen: BLOOD  Result Value Ref Range Status   Specimen Description   Final    BLOOD RIGHT ANTECUBITAL Performed at [facility], [address] [place], [place] [address]    Special Requests   Final    Blood Culture adequate volume BOTTLES DRAWN AEROBIC AND ANAEROBIC Performed at [facility], [address] [place], [place] [address]    Culture   Final    NO GROWTH 2 DAYS Performed at [facility], [place] [address] [place], [place] [address]    Report Status PENDING  Incomplete  Blood Culture (routine x 2)     Status:  None (Preliminary result)   Collection Time: 05/18/21  5:08 PM   Specimen: BLOOD  Result Value Ref Range Status   Specimen Description   Final    BLOOD BLOOD LEFT WRIST Performed at [facility], [address] [place], [place] [address]    Special Requests   Final    Blood Culture adequate volume BOTTLES DRAWN AEROBIC ONLY Performed at [facility], [address] [place], [place] [address]    Culture   Final    NO GROWTH 2 DAYS Performed at [facility], [place] [address], [place], [place] [address]    Report Status PENDING  Incomplete  Urine Culture     Status: Abnormal   Collection Time: 05/19/21  2:47 PM   Specimen: Urine, Clean Catch  Result Value Ref Range Status   Specimen Description   Final    URINE, CLEAN CATCH Performed at [facility], [place] [address] [place], [place] [address]    Special Requests   Final    NONE Performed at [facility], [address] [place],  [place] [address]    Culture (A)  Final    <10,000 COLONIES/mL INSIGNIFICANT GROWTH Performed at [facility], [place] [address], [place], [place] [address]    Report Status 05/21/2021 FINAL  Final     Time coordinating discharge:  35 minutes  SIGNED:   Barb Merino, MD  [facility] 05/21/2021, 11:23 AM

## 2021-05-21 NOTE — Plan of Care (Signed)
  Problem: Education: Goal: Knowledge of General Education information will improve Description: Including pain rating scale, medication(s)/side effects and non-pharmacologic comfort measures Outcome: Progressing   Problem: Health Behavior/Discharge Planning: Goal: Ability to manage health-related needs will improve Outcome: Progressing   Problem: Clinical Measurements: Goal: Ability to maintain clinical measurements within normal limits will improve Outcome: Progressing Goal: Will remain free from infection Outcome: Progressing Goal: Diagnostic test results will improve Outcome: Progressing Goal: Respiratory complications will improve Outcome: Progressing Goal: Cardiovascular complication will be avoided Outcome: Progressing   Problem: Activity: Goal: Risk for activity intolerance will decrease Outcome: Progressing   Problem: Nutrition: Goal: Adequate nutrition will be maintained Outcome: Progressing   Problem: Coping: Goal: Level of anxiety will decrease Outcome: Progressing   Problem: Pain Managment: Goal: General experience of comfort will improve Outcome: Progressing   Problem: Safety: Goal: Ability to remain free from injury will improve Outcome: Progressing   Problem: Skin Integrity: Goal: Risk for impaired skin integrity will decrease Outcome: Progressing   Problem: Education: Goal: Knowledge of risk factors and measures for prevention of condition will improve Outcome: Progressing   Problem: Coping: Goal: Psychosocial and spiritual needs will be supported Outcome: Progressing   Problem: Respiratory: Goal: Will maintain a patent airway Outcome: Progressing Goal: Complications related to the disease process, condition or treatment will be avoided or minimized Outcome: Progressing   

## 2021-05-23 LAB — CULTURE, BLOOD (ROUTINE X 2)
Culture: NO GROWTH
Culture: NO GROWTH
Special Requests: ADEQUATE
Special Requests: ADEQUATE

## 2021-05-29 ENCOUNTER — Encounter: Payer: Self-pay | Admitting: Podiatry

## 2021-05-29 ENCOUNTER — Other Ambulatory Visit: Payer: Self-pay

## 2021-05-29 ENCOUNTER — Ambulatory Visit (INDEPENDENT_AMBULATORY_CARE_PROVIDER_SITE_OTHER): Payer: 59 | Admitting: Podiatry

## 2021-05-29 DIAGNOSIS — L97522 Non-pressure chronic ulcer of other part of left foot with fat layer exposed: Secondary | ICD-10-CM

## 2021-05-29 DIAGNOSIS — L97512 Non-pressure chronic ulcer of other part of right foot with fat layer exposed: Secondary | ICD-10-CM

## 2021-05-29 DIAGNOSIS — E11621 Type 2 diabetes mellitus with foot ulcer: Secondary | ICD-10-CM

## 2021-05-29 NOTE — Progress Notes (Signed)
Subjective:  Patient ID: Jose [name], male    DOB: 24-Sep-1976,  MRN: [id number]  Chief Complaint  Patient presents with   Wound Check    Bilateral wound check     45 y.o. male presents for wound care.  Patient presents for follow-up of bilateral hallux wound.  Patient states that he was [facility] hospitalized for COVID and was unable to do dressing changes daily.  He is here to get it reevaluated.  The right side has now completely healed and the left side has an open partial-thickness ulcer.   Review of Systems: Negative except as noted in the HPI. Denies N/V/F/Ch.  Past Medical History:  Diagnosis Date   GAD (generalized anxiety disorder)    Hyperlipidemia    Macular degeneration, bilateral    Retinopathy    Type II diabetes mellitus with complication, uncontrolled (HCC)    retinopathy, neuropathy, microalbuminuria    Current Outpatient Medications:    acetaminophen (TYLENOL) 500 MG tablet, Take 1,000 mg by mouth every 6 (six) hours as needed for mild pain, fever or headache., Disp: , Rfl:    ALPRAZolam (XANAX) 0.25 MG tablet, Take 1 tablet (0.25 mg total) by mouth 2 (two) times daily as needed for anxiety., Disp: 60 tablet, Rfl: 2   amLODipine (NORVASC) 5 MG tablet, Take 5 mg by mouth daily., Disp: , Rfl:    amphetamine-dextroamphetamine (ADDERALL) 20 MG tablet, Take 1 tablet (20 mg total) by mouth 2 (two) times daily., Disp: 60 tablet, Rfl: 0   Ascorbic Acid (VITAMIN C PO), Take 1 tablet by mouth daily., Disp: , Rfl:    atorvastatin (LIPITOR) 40 MG tablet, Take 1 tablet (40 mg total) by mouth daily., Disp: 90 tablet, Rfl: 3   benzonatate (TESSALON) 100 MG capsule, Take 100-200 mg by mouth 3 (three) times daily as needed for cough., Disp: , Rfl:    Dulaglutide (TRULICITY) 3 0000000 SOPN, Inject 3 mg as directed once a week., Disp: 6 mL, Rfl: 3   DULoxetine (CYMBALTA) 60 MG capsule, TAKE 1 CAPSULE(60 MG) BY MOUTH DAILY, Disp: 30 capsule, Rfl: 2   furosemide (LASIX) 20 MG tablet,  Take 20 mg by mouth daily., Disp: , Rfl:    gabapentin (NEURONTIN) 300 MG capsule, TAKE 1 TO 2 CAPSULES(300 TO 600 MG) BY MOUTH AT BEDTIME, Disp: 180 capsule, Rfl: 1   glimepiride (AMARYL) 1 MG tablet, Take 1 tablet (1 mg total) by mouth daily with breakfast., Disp: 90 tablet, Rfl: 3   losartan (COZAAR) 50 MG tablet, TAKE 1 TABLET(50 MG) BY MOUTH DAILY, Disp: 30 tablet, Rfl: 0   metFORMIN (GLUCOPHAGE-XR) 500 MG 24 hr tablet, Take 2 tablets (1,000 mg total) by mouth daily., Disp: 180 tablet, Rfl: 3   Multiple Vitamin (MULTIVITAMIN ADULT PO), Take 1 tablet by mouth daily., Disp: , Rfl:    Promethazine HCl 6.25 MG/5ML SOLN, Take 10 mLs by mouth every 6 (six) hours as needed (nausea)., Disp: , Rfl:    Ranibizumab (LUCENTIS) 0.3 MG/0.05ML SOLN, by Intravitreal route. , Disp: , Rfl:    tadalafil (CIALIS) 20 MG tablet, TK 1 T PO  PO QD PRN FOR ERECTILE DYSFUNCTION, Disp: 10 tablet, Rfl: 11   tobramycin (TOBREX) 0.3 % ophthalmic solution, Place 1 drop into both eyes in the morning, at noon, in the evening, and at bedtime. Begin 1 day prior to treatment and continue the day of treatment and for one full day after treatment., Disp: , Rfl: 5   traZODone (DESYREL) 100 MG tablet, TAKE  1 TABLET(100 MG) BY MOUTH AT BEDTIME AS NEEDED FOR SLEEP, Disp: 90 tablet, Rfl: 0   verapamil (CALAN-SR) 120 MG CR tablet, Take 120 mg by mouth daily., Disp: , Rfl:    VITAMIN D PO, Take 1 tablet by mouth daily., Disp: , Rfl:   Social History   Tobacco Use  Smoking Status Former   Packs/day: 1.00   Years: 20.00   Pack years: 20.00   Types: Cigarettes   Quit date: 07/2020   Years since quitting: 0.8  Smokeless Tobacco Never    No Known Allergies Objective:  There were no vitals filed for this visit. There is no height or weight on file to calculate BMI. Constitutional Well developed. Well nourished.  Vascular Dorsalis pedis pulses failty palpable bilaterally. Posterior tibial pulses faintly palpable  bilaterally. Capillary refill normal to all digits.  No cyanosis or clubbing noted. Pedal hair growth normal.  Neurologic Normal speech. Oriented to person, place, and time. Protective sensation absent  Dermatologic Wound Location: Left hallux wound wit limited to the breakdown of the skin.  No clinical signs of infection noted.  No malodor present.  No purulent drainage noted. Wound Base: Mixed Granular/Fibrotic Peri-wound: Calloused Exudate: Scant/small amount Serosanguinous exudate Wound Measurements: -See below  Right hallux wound completely epithelialized no further infection noted or ulceration  Orthopedic: No pain to palpation either foot.   Radiographs: None Assessment:   1. 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 Diabetic ulcer of toe of right foot associated with type 2 diabetes mellitus, with fat layer exposed ([place])        Plan:  Patient was evaluated and treated and all questions answered. Right hallux wound -Completely epithelialized.  No further ulceration noted  Ulcer bilateral hallux with limited to the breakdown of the skin -Debridement as below. -Dressed with Betadine wet-to-dry, DSD. -Continue off-loading with surgical shoe.  Bilateral -I will hold off on any current graft treatment at this time.   Procedure: Excisional Debridement of Wound left hallux Tool: Sharp chisel blade/tissue nipper Rationale: Removal of non-viable soft tissue from the wound to promote healing.  Anesthesia: none Pre-Debridement Wound Measurements: 0.4cm x 0.3 cm x 0. 2 cm Post-Debridement Wound Measurements: 0.4cm x 0.3 cm x 0. 2 cm Type of Debridement: Sharp Excisional Tissue Removed: Non-viable soft tissue Blood loss: Minimal (<50cc) Depth of Debridement: subcutaneous tissue. Technique: Sharp excisional debridement to bleeding, viable wound base.  Wound Progress: This is my initial evaluation.  Given  the patient was hospitalized they reopen this wound. Dressing: Dry, sterile, compression dressing. Disposition: Patient tolerated procedure well. Patient to return in 1 week for follow-up.   No follow-ups on file.  No follow-ups on file.

## 2021-06-01 ENCOUNTER — Telehealth (HOSPITAL_COMMUNITY): Payer: Self-pay

## 2021-06-01 NOTE — Telephone Encounter (Signed)
THIS IS A FORMER PT OF DR. PUCILOWSKI'S. WE RECEIVED A FAX FROM [facility] REGARDING THIS PATIENT'S AMPHETAMINE-DEXTROAMPHETAMINE INFORMING us THAT THE EXISTING PRIOR AUTHORIZATION WAS EXPIRING SOON. WRITER CALLED THEM AND SPOKE TO JENNIFER. WRITER INFORMED HER THAT A CERTIFIED LETTER WITH LIST OF REFERRALS HAS BEEN SENT TO PATIENT AND THAT WE CAN NOT DO THE PA DUE TO PROVIDER RETIRING AND NO LONGER WITH OUR CLINIC. [place] INFORMED ME THAT WE'LL KEEP GETTING THE NOTICE FAXED TO [place] UNTIL THE PT TRIES TO FILL IT AND THEN THE NOTICES WILL GO TO THE NEW PROVIDER  REFERENCE # [id number]

## 2021-06-09 ENCOUNTER — Telehealth (HOSPITAL_COMMUNITY): Payer: 59 | Admitting: Psychiatry

## 2021-06-11 ENCOUNTER — Other Ambulatory Visit: Payer: Self-pay

## 2021-06-11 ENCOUNTER — Ambulatory Visit (INDEPENDENT_AMBULATORY_CARE_PROVIDER_SITE_OTHER): Payer: 59 | Admitting: Endocrinology

## 2021-06-11 VITALS — BP 128/84 | HR 96 | Ht 71.0 in | Wt 192.2 lb

## 2021-06-11 DIAGNOSIS — E1122 Type 2 diabetes mellitus with diabetic chronic kidney disease: Secondary | ICD-10-CM | POA: Diagnosis not present

## 2021-06-11 DIAGNOSIS — N183 Chronic kidney disease, stage 3 unspecified: Secondary | ICD-10-CM

## 2021-06-11 DIAGNOSIS — E1142 Type 2 diabetes mellitus with diabetic polyneuropathy: Secondary | ICD-10-CM

## 2021-06-11 DIAGNOSIS — E119 Type 2 diabetes mellitus without complications: Secondary | ICD-10-CM

## 2021-06-11 LAB — POCT GLYCOSYLATED HEMOGLOBIN (HGB A1C): Hemoglobin A1C: 7.5 % — AB (ref 4.0–5.6)

## 2021-06-11 MED ORDER — TRULICITY 3 MG/0.5ML ~~LOC~~ SOAJ: 3.0000 mg | SUBCUTANEOUS | 3 refills | Status: DC

## 2021-06-11 MED ORDER — METFORMIN HCL ER 500 MG PO TB24: 500.0000 mg | ORAL_TABLET | Freq: Every day | ORAL | 3 refills | Status: DC

## 2021-06-11 NOTE — Progress Notes (Signed)
Subjective:    Patient ID: Jose [name], male    DOB: 1976-01-06, 45 y.o.   MRN: [id number]  HPI Pt returns for f/u of diabetes mellitus: DM type: 2 Dx'ed: Trulicity and 2 oral meds Complications: stage 3 CRI and PN Therapy: Trulicity and 2 oral meds DKA: never Severe hypoglycemia: never Pancreatitis: never Pancreatic imaging: never SDOH: none Other: he has never been on insulin Interval history: He was given steroids in the hosp last month, for COVID infection.  no cbg record, but states cbg's vary from 115-194 Past Medical History:  Diagnosis Date   GAD (generalized anxiety disorder)    Hyperlipidemia    Macular degeneration, bilateral    Retinopathy    Type II diabetes mellitus with complication, uncontrolled ([place])    retinopathy, neuropathy, microalbuminuria    Past Surgical History:  Procedure Laterality Date   APPENDECTOMY     HERNIA REPAIR     TRIGGER FINGER RELEASE Right 10/25/2019   Procedure: RIGHT INDEX FINGER RELEASE TRIGGER FINGER/A-1 PULLEY;  Surgeon: Daryll Brod, MD;  Location: [facility];  Service: Orthopedics;  Laterality: Right;  IV REGIONAL FOREARM BLOCK    Social History   Socioeconomic History   Marital status: Significant Other    Spouse name: Not on file   Number of children: 2   Years of education: Not on file   Highest education level: Bachelor's degree (e.g., BA, AB, BS)  Occupational History   Occupation: Multimedia programmer: [employer]  Tobacco Use   Smoking status: Former    Packs/day: 1.00    Years: 20.00    Pack years: 20.00    Types: Cigarettes    Quit date: 07/2020    Years since quitting: 0.8   Smokeless tobacco: Never  Vaping Use   Vaping Use: Never used  Substance and Sexual Activity   Alcohol use: Not Currently   Drug use: Never   Sexual activity: Yes    Partners: Female    Birth control/protection: None    Comment: with monogamous partner  Other Topics Concern   Not on file   Social History Narrative   Pt has lived 8 of life in [place]. Lives at home with partner, 2 kids, 8 cats, and 1 dog.    Social Determinants of Health   Financial Resource Strain: Not on file  Food Insecurity: Not on file  Transportation Needs: Not on file  Physical Activity: Not on file  Stress: Not on file  Social Connections: Not on file  Intimate Partner Violence: Not on file    Current Outpatient Medications on File Prior to Visit  Medication Sig Dispense Refill   acetaminophen (TYLENOL) 500 MG tablet Take 1,000 mg by mouth every 6 (six) hours as needed for mild pain, fever or headache.     amLODipine (NORVASC) 5 MG tablet Take 5 mg by mouth daily.     amphetamine-dextroamphetamine (ADDERALL) 20 MG tablet Take 1 tablet (20 mg total) by mouth 2 (two) times daily. 60 tablet 0   Ascorbic Acid (VITAMIN C PO) Take 1 tablet by mouth daily.     atorvastatin (LIPITOR) 40 MG tablet Take 1 tablet (40 mg total) by mouth daily. 90 tablet 3   benzonatate (TESSALON) 100 MG capsule Take 100-200 mg by mouth 3 (three) times daily as needed for cough.     DULoxetine (CYMBALTA) 60 MG capsule TAKE 1 CAPSULE(60 MG) BY MOUTH DAILY 30 capsule 2   furosemide (LASIX) 20  MG tablet Take 20 mg by mouth daily.     gabapentin (NEURONTIN) 300 MG capsule TAKE 1 TO 2 CAPSULES(300 TO 600 MG) BY MOUTH AT BEDTIME 180 capsule 1   glimepiride (AMARYL) 1 MG tablet Take 1 tablet (1 mg total) by mouth daily with breakfast. 90 tablet 3   losartan (COZAAR) 50 MG tablet TAKE 1 TABLET(50 MG) BY MOUTH DAILY 30 tablet 0   Multiple Vitamin (MULTIVITAMIN ADULT PO) Take 1 tablet by mouth daily.     Promethazine HCl 6.25 MG/5ML SOLN Take 10 mLs by mouth every 6 (six) hours as needed (nausea).     Ranibizumab (LUCENTIS) 0.3 MG/0.05ML SOLN by Intravitreal route.      tadalafil (CIALIS) 20 MG tablet TK 1 T PO  PO QD PRN FOR ERECTILE DYSFUNCTION 10 tablet 11   tobramycin (TOBREX) 0.3 % ophthalmic solution Place 1 drop into  both eyes in the morning, at noon, in the evening, and at bedtime. Begin 1 day prior to treatment and continue the day of treatment and for one full day after treatment.  5   traZODone (DESYREL) 100 MG tablet TAKE 1 TABLET(100 MG) BY MOUTH AT BEDTIME AS NEEDED FOR SLEEP 90 tablet 0   verapamil (CALAN-SR) 120 MG CR tablet Take 120 mg by mouth daily.     VITAMIN D PO Take 1 tablet by mouth daily.     No current facility-administered medications on file prior to visit.    No Known Allergies  Family History  Problem Relation Age of Onset   Diabetes Mother    Hyperlipidemia Mother    Stroke Mother    Diabetes Father    Hyperlipidemia Brother    Stroke Brother    ADD / ADHD Brother    ADD / ADHD Son     BP 128/84 (BP Location: Right Arm, Patient Position: Sitting, Cuff Size: Normal)   Pulse 96   Ht '5\' 11"'$  (1.803 m)   Wt 192 lb 3.2 oz (87.2 kg)   SpO2 97%   BMI 26.81 kg/m    Review of Systems Heartburn is mild now.     Objective:   Physical Exam Pulses: dorsalis pedis intact bilat.   MSK: no deformity of the feet CV: no leg edema Skin:  no ulcer on the feet.  normal color and temp on the feet.  Neuro: sensation is intact to touch on the feet.  Ext: left great toe is bandaged (sees podiatry)  A1c=7.5%  Lab Results  Component Value Date   CREATININE 1.55 (H) 05/21/2021   BUN 35 (H) 05/21/2021   NA 136 05/21/2021   K 3.9 05/21/2021   CL 104 05/21/2021   CO2 27 05/21/2021      Assessment & Plan:  Type 2 DM: uncontrolled, poss due to steroids.  CRI: she should reduce metformin.    Patient Instructions  Please reduce the metformin to 1 pill per day.   Please continue the same other medications.  check your blood sugar once a day.  vary the time of day when you check, between before the 3 meals, and at bedtime.  also check if you have symptoms of your blood sugar being too high or too low.  please keep a record of the readings and bring it to your next appointment here  (or you can bring the meter itself).  You can write it on any piece of paper.  please call us sooner if your blood sugar goes below 70, or if most  of your readings are over 200.   Please come back for a follow-up appointment in 2 months.

## 2021-06-11 NOTE — Patient Instructions (Addendum)
Please reduce the metformin to 1 pill per day.   Please continue the same other medications.  check your blood sugar once a day.  vary the time of day when you check, between before the 3 meals, and at bedtime.  also check if you have symptoms of your blood sugar being too high or too low.  please keep a record of the readings and bring it to your next appointment here (or you can bring the meter itself).  You can write it on any piece of paper.  please call us sooner if your blood sugar goes below 70, or if most of your readings are over 200.   Please come back for a follow-up appointment in 2 months.

## 2021-06-12 ENCOUNTER — Ambulatory Visit (INDEPENDENT_AMBULATORY_CARE_PROVIDER_SITE_OTHER): Payer: 59 | Admitting: Podiatry

## 2021-06-12 DIAGNOSIS — E11621 Type 2 diabetes mellitus with foot ulcer: Secondary | ICD-10-CM

## 2021-06-12 DIAGNOSIS — L97522 Non-pressure chronic ulcer of other part of left foot with fat layer exposed: Secondary | ICD-10-CM | POA: Diagnosis not present

## 2021-06-17 NOTE — Progress Notes (Signed)
Subjective:  Patient ID: Jose [name], male    DOB: 21-Sep-1976,  MRN: [id number]  Chief Complaint  Patient presents with   Diabetic Ulcer    Left hallux medial aspect wound. Dressing clean dry and intact. Patient denies nausea, vomiting, fever and chills at this time.     45 y.o. male presents for wound care.  Patient presents for follow-up of bilateral hallux wound.  Patient states is doing well.  He has been keeping it covered.  He has been applying triple antibiotic and a Band-Aid   Review of Systems: Negative except as noted in the HPI. Denies N/V/F/Ch.  Past Medical History:  Diagnosis Date   GAD (generalized anxiety disorder)    Hyperlipidemia    Macular degeneration, bilateral    Retinopathy    Type II diabetes mellitus with complication, uncontrolled (HCC)    retinopathy, neuropathy, microalbuminuria    Current Outpatient Medications:    acetaminophen (TYLENOL) 500 MG tablet, Take 1,000 mg by mouth every 6 (six) hours as needed for mild pain, fever or headache., Disp: , Rfl:    amLODipine (NORVASC) 5 MG tablet, Take 5 mg by mouth daily., Disp: , Rfl:    amphetamine-dextroamphetamine (ADDERALL) 20 MG tablet, Take 1 tablet (20 mg total) by mouth 2 (two) times daily., Disp: 60 tablet, Rfl: 0   Ascorbic Acid (VITAMIN C PO), Take 1 tablet by mouth daily., Disp: , Rfl:    atorvastatin (LIPITOR) 40 MG tablet, Take 1 tablet (40 mg total) by mouth daily., Disp: 90 tablet, Rfl: 3   benzonatate (TESSALON) 100 MG capsule, Take 100-200 mg by mouth 3 (three) times daily as needed for cough., Disp: , Rfl:    Dulaglutide (TRULICITY) 3 0000000 SOPN, Inject 3 mg as directed once a week., Disp: 6 mL, Rfl: 3   DULoxetine (CYMBALTA) 60 MG capsule, TAKE 1 CAPSULE(60 MG) BY MOUTH DAILY, Disp: 30 capsule, Rfl: 2   furosemide (LASIX) 20 MG tablet, Take 20 mg by mouth daily., Disp: , Rfl:    gabapentin (NEURONTIN) 300 MG capsule, TAKE 1 TO 2 CAPSULES(300 TO 600 MG) BY MOUTH AT BEDTIME, Disp: 180  capsule, Rfl: 1   glimepiride (AMARYL) 1 MG tablet, Take 1 tablet (1 mg total) by mouth daily with breakfast., Disp: 90 tablet, Rfl: 3   losartan (COZAAR) 50 MG tablet, TAKE 1 TABLET(50 MG) BY MOUTH DAILY, Disp: 30 tablet, Rfl: 0   metFORMIN (GLUCOPHAGE-XR) 500 MG 24 hr tablet, Take 1 tablet (500 mg total) by mouth daily., Disp: 90 tablet, Rfl: 3   Multiple Vitamin (MULTIVITAMIN ADULT PO), Take 1 tablet by mouth daily., Disp: , Rfl:    Promethazine HCl 6.25 MG/5ML SOLN, Take 10 mLs by mouth every 6 (six) hours as needed (nausea)., Disp: , Rfl:    Ranibizumab (LUCENTIS) 0.3 MG/0.05ML SOLN, by Intravitreal route. , Disp: , Rfl:    tadalafil (CIALIS) 20 MG tablet, TK 1 T PO  PO QD PRN FOR ERECTILE DYSFUNCTION, Disp: 10 tablet, Rfl: 11   tobramycin (TOBREX) 0.3 % ophthalmic solution, Place 1 drop into both eyes in the morning, at noon, in the evening, and at bedtime. Begin 1 day prior to treatment and continue the day of treatment and for one full day after treatment., Disp: , Rfl: 5   traZODone (DESYREL) 100 MG tablet, TAKE 1 TABLET(100 MG) BY MOUTH AT BEDTIME AS NEEDED FOR SLEEP, Disp: 90 tablet, Rfl: 0   verapamil (CALAN-SR) 120 MG CR tablet, Take 120 mg by mouth daily., Disp: ,  Rfl:    VITAMIN D PO, Take 1 tablet by mouth daily., Disp: , Rfl:   Social History   Tobacco Use  Smoking Status Former   Packs/day: 1.00   Years: 20.00   Pack years: 20.00   Types: Cigarettes   Quit date: 07/2020   Years since quitting: 0.8  Smokeless Tobacco Never    No Known Allergies Objective:  There were no vitals filed for this visit. There is no height or weight on file to calculate BMI. Constitutional Well developed. Well nourished.  Vascular Dorsalis pedis pulses failty palpable bilaterally. Posterior tibial pulses faintly palpable bilaterally. Capillary refill normal to all digits.  No cyanosis or clubbing noted. Pedal hair growth normal.  Neurologic Normal speech. Oriented to person, place,  and time. Protective sensation absent  Dermatologic Wound Location: Left hallux wound wit limited to the breakdown of the skin.  No clinical signs of infection noted.  No malodor present.  No purulent drainage noted. Wound Base: Mixed Granular/Fibrotic Peri-wound: Calloused Exudate: Scant/small amount Serosanguinous exudate Wound Measurements: -See below  Right hallux wound completely epithelialized no further infection noted or ulceration  Orthopedic: No pain to palpation either foot.   Radiographs: None Assessment:   1. 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 Right hallux wound -Completely epithelialized.  No further ulceration noted  Ulcer bilateral hallux with limited to the breakdown of the skin -Debridement as below. -Dressed with Betadine wet-to-dry, DSD. -Continue off-loading with surgical shoe.  Bilateral -I will hold off on any current graft treatment at this time.   Procedure: Excisional Debridement of Wound left hallux Tool: Sharp chisel blade/tissue nipper Rationale: Removal of non-viable soft tissue from the wound to promote healing.  Anesthesia: none Pre-Debridement Wound Measurements: 0.3cm x 0.3 cm x 0. 2 cm Post-Debridement Wound Measurements: 0.3cm x 0.3 cm x 0. 2 cm Type of Debridement: Sharp Excisional Tissue Removed: Non-viable soft tissue Blood loss: Minimal (<50cc) Depth of Debridement: subcutaneous tissue. Technique: Sharp excisional debridement to bleeding, viable wound base.  Wound Progress: This is my initial evaluation.  Given the patient was hospitalized they reopen this wound. Dressing: Dry, sterile, compression dressing. Disposition: Patient tolerated procedure well. Patient to return in 1 week for follow-up.   No follow-ups on file.  No follow-ups on file.

## 2021-06-26 ENCOUNTER — Other Ambulatory Visit: Payer: Self-pay | Admitting: Family Medicine

## 2021-06-26 ENCOUNTER — Other Ambulatory Visit: Payer: Self-pay

## 2021-06-26 ENCOUNTER — Ambulatory Visit
Admission: RE | Admit: 2021-06-26 | Discharge: 2021-06-26 | Disposition: A | Discharge status: Home / Self Care | Payer: 59 | Source: Ambulatory Visit | Attending: Family Medicine | Admitting: Family Medicine

## 2021-06-26 DIAGNOSIS — R0789 Other chest pain: Secondary | ICD-10-CM

## 2021-06-26 IMAGING — CR DG CHEST 2V
2 series · 2 of 2 positions shown · non-contrast
Comparison: [DATE]

CLINICAL DATA: Right-sided chest pain and right arm pain.

EXAM:
CHEST - 2 VIEW

[w chest pa (1 of 2)]
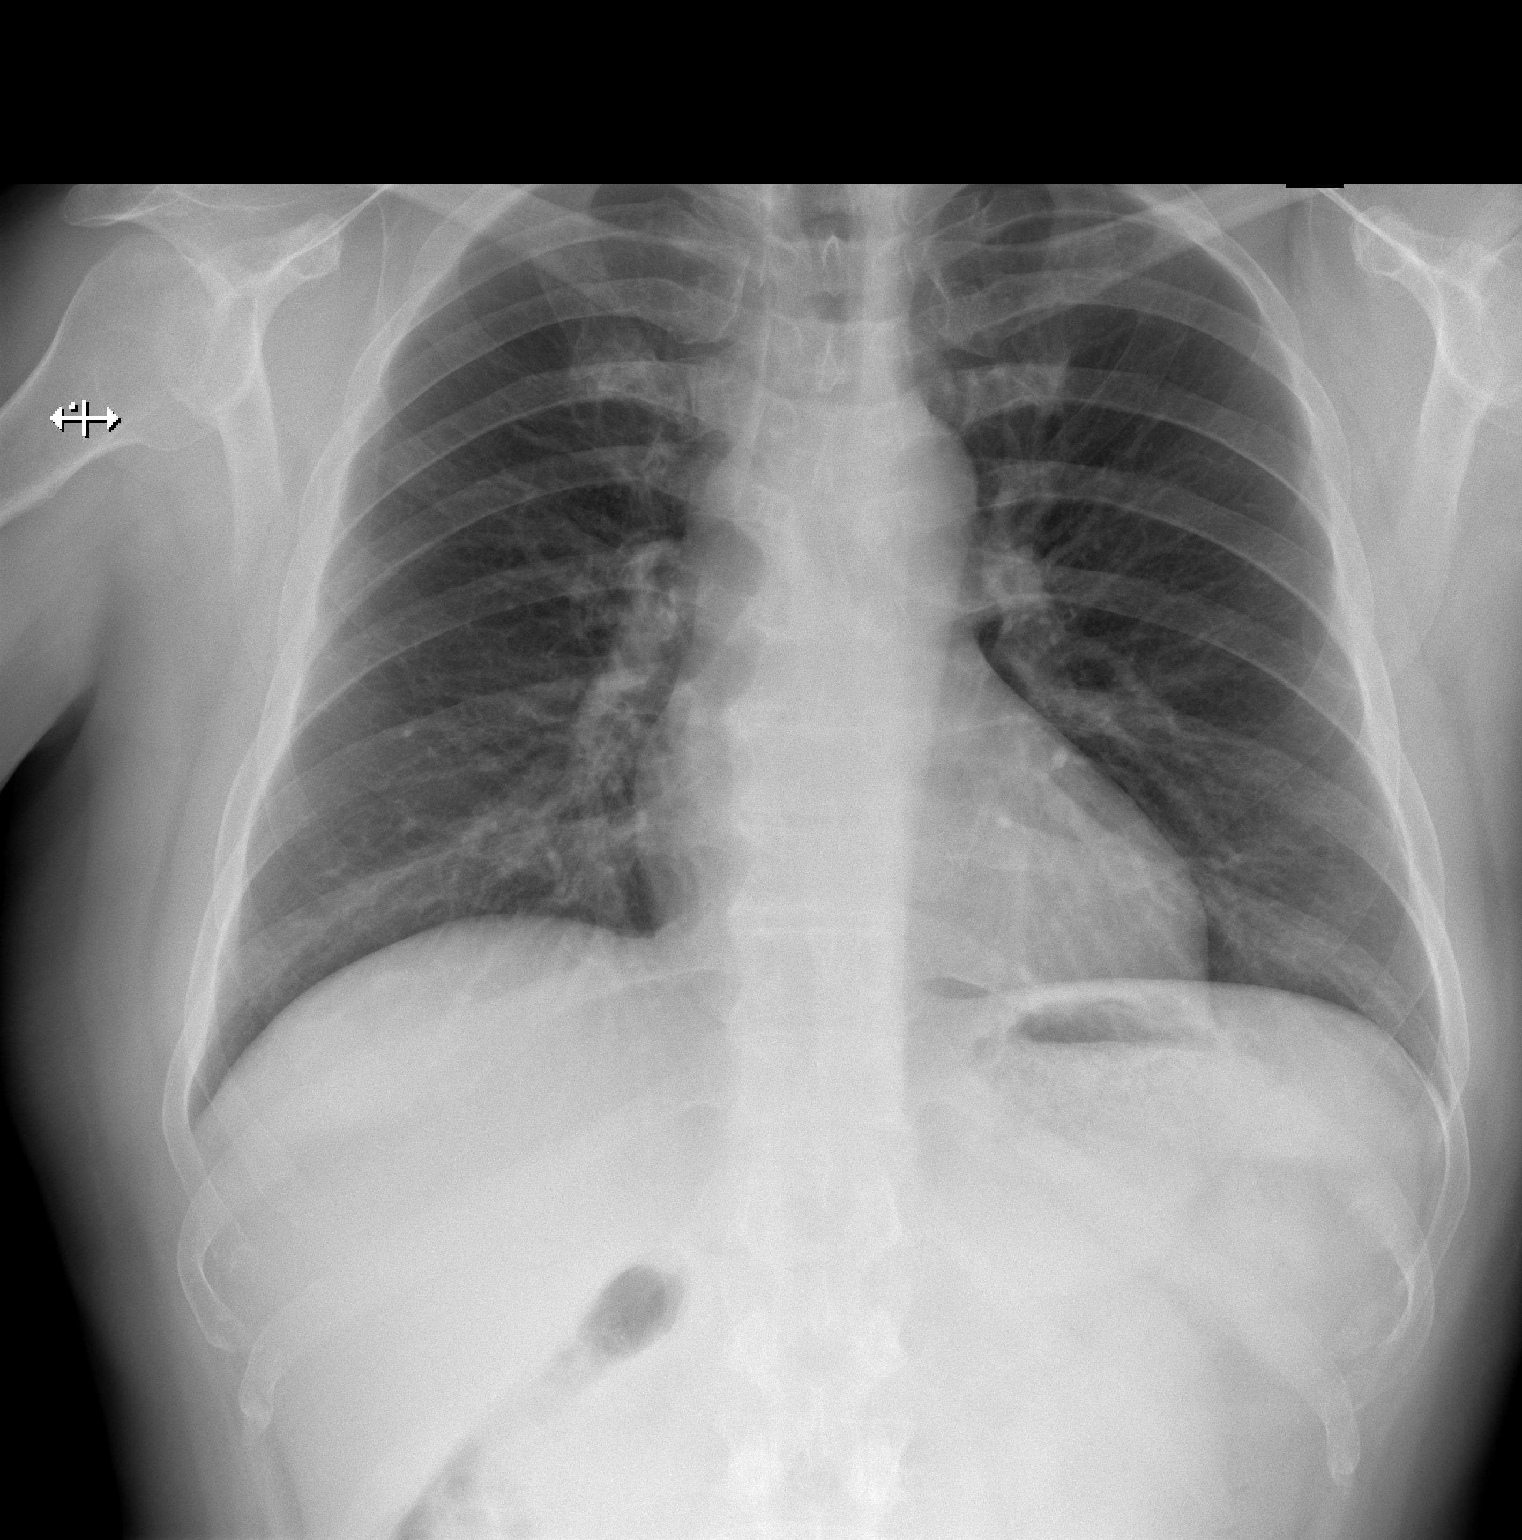

[w chest pa (2 of 2)]
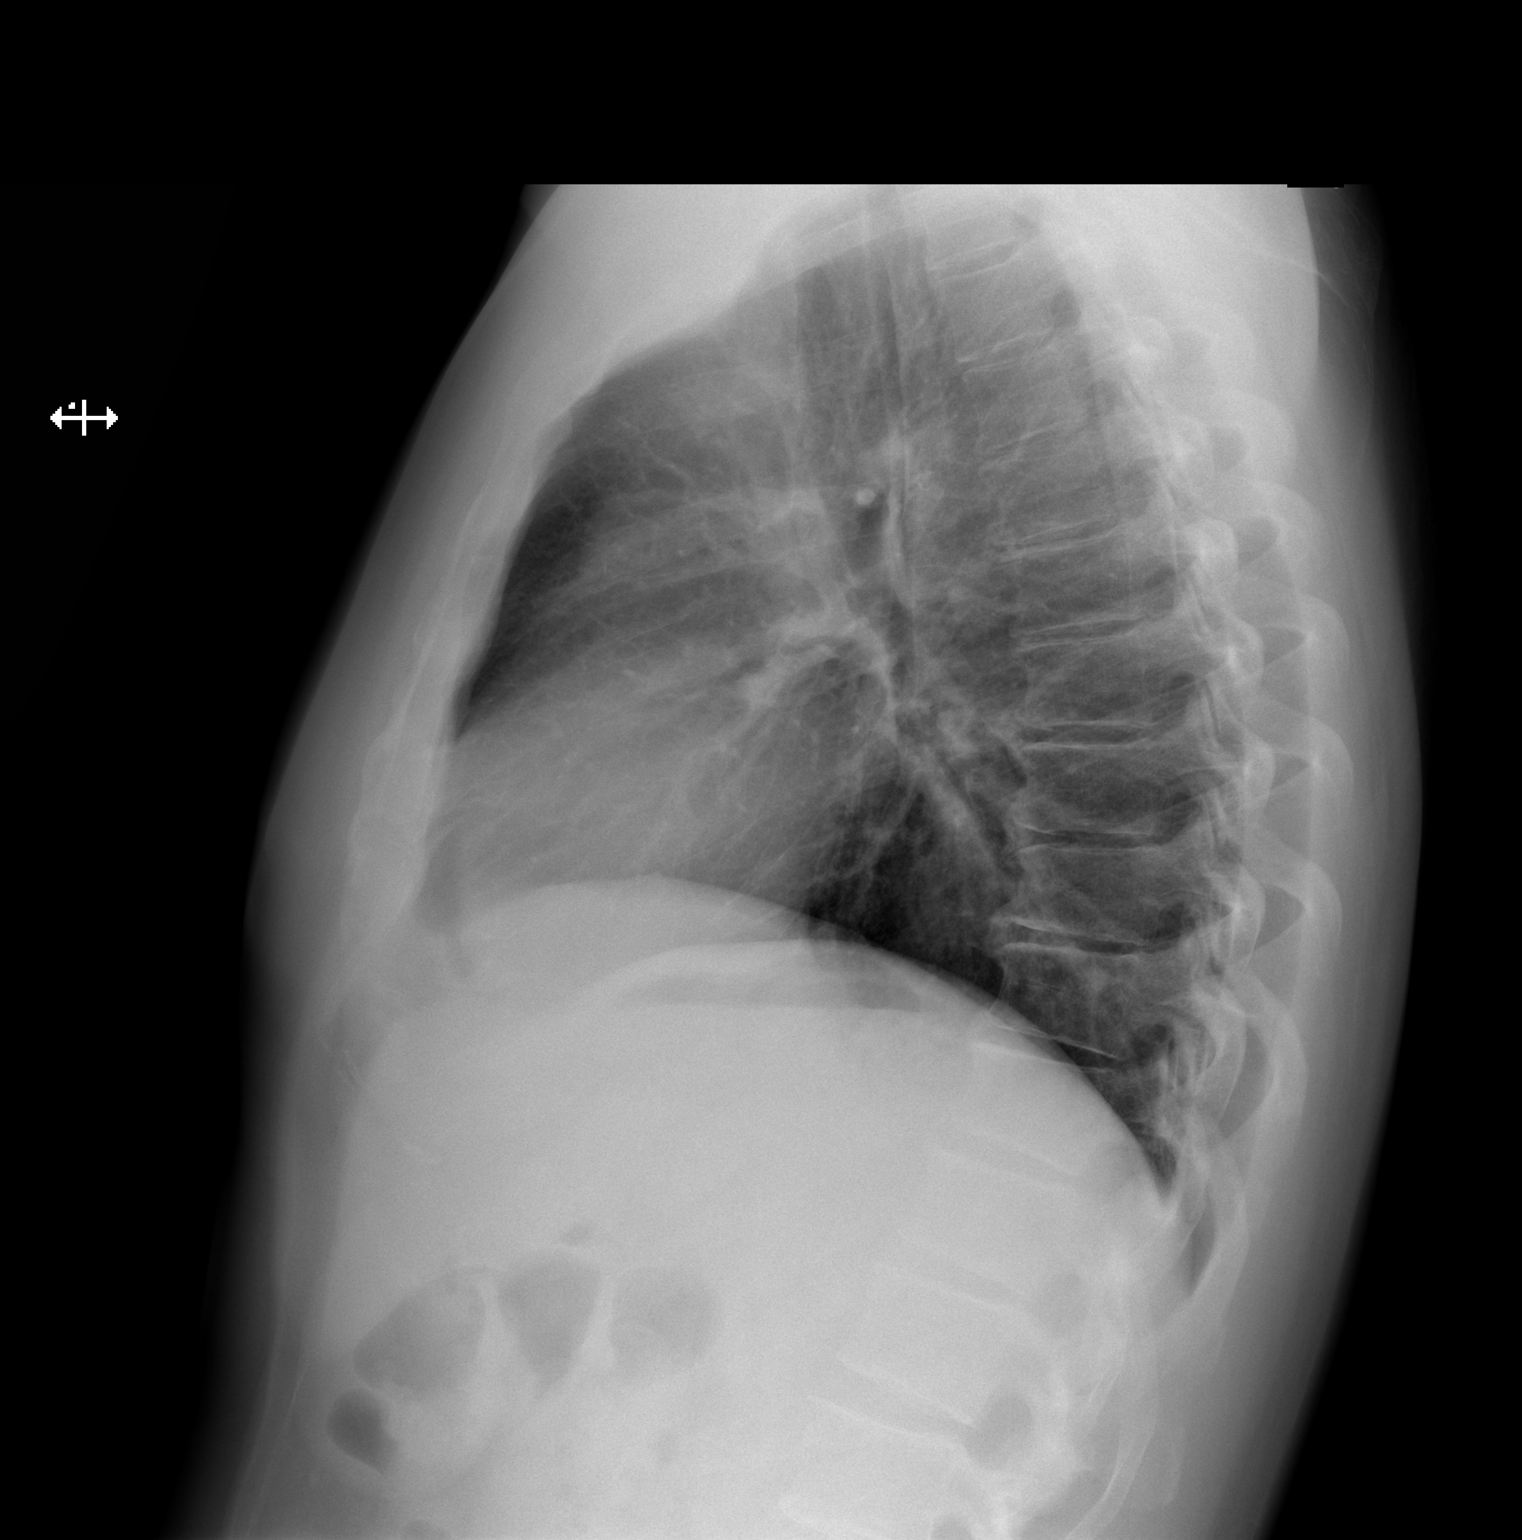

[2 of 2 positions shown; findings below may reference images not displayed]

FINDINGS: The heart size and mediastinal contours are within normal limits.
Both lungs are clear. The visualized skeletal structures are
unremarkable.
IMPRESSION: No active cardiopulmonary disease.

## 2021-07-03 ENCOUNTER — Other Ambulatory Visit: Payer: Self-pay

## 2021-07-03 ENCOUNTER — Ambulatory Visit (INDEPENDENT_AMBULATORY_CARE_PROVIDER_SITE_OTHER): Payer: 59 | Admitting: Podiatry

## 2021-07-03 DIAGNOSIS — E11621 Type 2 diabetes mellitus with foot ulcer: Secondary | ICD-10-CM | POA: Diagnosis not present

## 2021-07-03 DIAGNOSIS — L97522 Non-pressure chronic ulcer of other part of left foot with fat layer exposed: Secondary | ICD-10-CM | POA: Diagnosis not present

## 2021-07-08 NOTE — Progress Notes (Signed)
Subjective:  Patient ID: Jose [name], male    DOB: 05/22/1976,  MRN: [id number]  Chief Complaint  Patient presents with   Follow-up    Diabetic ulcer of toe of left foot associated with type 2 diabetes mellitus    45 y.o. male presents for wound care.  Patient presents with now follow-up of left hallux wound.  The right side still staying healed.  He has been doing Betadine wet-to-dry dressing changes.   Review of Systems: Negative except as noted in the HPI. Denies N/V/F/Ch.  Past Medical History:  Diagnosis Date   GAD (generalized anxiety disorder)    Hyperlipidemia    Macular degeneration, bilateral    Retinopathy    Type II diabetes mellitus with complication, uncontrolled (HCC)    retinopathy, neuropathy, microalbuminuria    Current Outpatient Medications:    acetaminophen (TYLENOL) 500 MG tablet, Take 1,000 mg by mouth every 6 (six) hours as needed for mild pain, fever or headache., Disp: , Rfl:    amLODipine (NORVASC) 5 MG tablet, Take 5 mg by mouth daily., Disp: , Rfl:    amphetamine-dextroamphetamine (ADDERALL) 20 MG tablet, Take 1 tablet (20 mg total) by mouth 2 (two) times daily., Disp: 60 tablet, Rfl: 0   Ascorbic Acid (VITAMIN C PO), Take 1 tablet by mouth daily., Disp: , Rfl:    atorvastatin (LIPITOR) 40 MG tablet, Take 1 tablet (40 mg total) by mouth daily., Disp: 90 tablet, Rfl: 3   benzonatate (TESSALON) 100 MG capsule, Take 100-200 mg by mouth 3 (three) times daily as needed for cough., Disp: , Rfl:    Dulaglutide (TRULICITY) 3 0000000 SOPN, Inject 3 mg as directed once a week., Disp: 6 mL, Rfl: 3   DULoxetine (CYMBALTA) 60 MG capsule, TAKE 1 CAPSULE(60 MG) BY MOUTH DAILY, Disp: 30 capsule, Rfl: 2   furosemide (LASIX) 20 MG tablet, Take 20 mg by mouth daily., Disp: , Rfl:    gabapentin (NEURONTIN) 300 MG capsule, TAKE 1 TO 2 CAPSULES(300 TO 600 MG) BY MOUTH AT BEDTIME, Disp: 180 capsule, Rfl: 1   glimepiride (AMARYL) 1 MG tablet, Take 1 tablet (1 mg total)  by mouth daily with breakfast., Disp: 90 tablet, Rfl: 3   losartan (COZAAR) 50 MG tablet, TAKE 1 TABLET(50 MG) BY MOUTH DAILY, Disp: 30 tablet, Rfl: 0   metFORMIN (GLUCOPHAGE-XR) 500 MG 24 hr tablet, Take 1 tablet (500 mg total) by mouth daily., Disp: 90 tablet, Rfl: 3   Multiple Vitamin (MULTIVITAMIN ADULT PO), Take 1 tablet by mouth daily., Disp: , Rfl:    Promethazine HCl 6.25 MG/5ML SOLN, Take 10 mLs by mouth every 6 (six) hours as needed (nausea)., Disp: , Rfl:    Ranibizumab (LUCENTIS) 0.3 MG/0.05ML SOLN, by Intravitreal route. , Disp: , Rfl:    tadalafil (CIALIS) 20 MG tablet, TK 1 T PO  PO QD PRN FOR ERECTILE DYSFUNCTION, Disp: 10 tablet, Rfl: 11   tobramycin (TOBREX) 0.3 % ophthalmic solution, Place 1 drop into both eyes in the morning, at noon, in the evening, and at bedtime. Begin 1 day prior to treatment and continue the day of treatment and for one full day after treatment., Disp: , Rfl: 5   traZODone (DESYREL) 100 MG tablet, TAKE 1 TABLET(100 MG) BY MOUTH AT BEDTIME AS NEEDED FOR SLEEP, Disp: 90 tablet, Rfl: 0   verapamil (CALAN-SR) 120 MG CR tablet, Take 120 mg by mouth daily., Disp: , Rfl:    VITAMIN D PO, Take 1 tablet by mouth daily., Disp: ,  Rfl:   Social History   Tobacco Use  Smoking Status Former   Packs/day: 1.00   Years: 20.00   Pack years: 20.00   Types: Cigarettes   Quit date: 07/2020   Years since quitting: 0.9  Smokeless Tobacco Never    No Known Allergies Objective:  There were no vitals filed for this visit. There is no height or weight on file to calculate BMI. Constitutional Well developed. Well nourished.  Vascular Dorsalis pedis pulses failty palpable bilaterally. Posterior tibial pulses faintly palpable bilaterally. Capillary refill normal to all digits.  No cyanosis or clubbing noted. Pedal hair growth normal.  Neurologic Normal speech. Oriented to person, place, and time. Protective sensation absent  Dermatologic Wound Location: Left hallux  wound wit limited to the breakdown of the skin.  No clinical signs of infection noted.  No malodor present.  No purulent drainage noted. Wound Base: Mixed Granular/Fibrotic Peri-wound: Calloused Exudate: Scant/small amount Serosanguinous exudate Wound Measurements: -See below  Right hallux wound completely epithelialized no further infection noted or ulceration  Orthopedic: No pain to palpation either foot.   Radiographs: None Assessment:   1. 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 Right hallux wound -Completely epithelialized.  No further ulceration noted  Ulcer left hallux with limited to the breakdown of the skin -Debridement as below. -Dressed with Betadine wet-to-dry, DSD. -Continue off-loading with surgical shoe.  Bilateral -I will hold off on any current graft treatment at this time.   Procedure: Excisional Debridement of Wound left hallux Tool: Sharp chisel blade/tissue nipper Rationale: Removal of non-viable soft tissue from the wound to promote healing.  Anesthesia: none Pre-Debridement Wound Measurements: 0.3cm x 0.3 cm x 0. 2 cm Post-Debridement Wound Measurements: 0.3cm x 0.3 cm x 0. 2 cm Type of Debridement: Sharp Excisional Tissue Removed: Non-viable soft tissue Blood loss: Minimal (<50cc) Depth of Debridement: subcutaneous tissue. Technique: Sharp excisional debridement to bleeding, viable wound base.  Wound Progress: The measurements are stagnant.  Improving very slowly Dressing: Dry, sterile, compression dressing. Disposition: Patient tolerated procedure well. Patient to return in 1 week for follow-up.   No follow-ups on file.  No follow-ups on file.

## 2021-07-15 ENCOUNTER — Ambulatory Visit (INDEPENDENT_AMBULATORY_CARE_PROVIDER_SITE_OTHER): Payer: 59 | Admitting: Podiatry

## 2021-07-15 ENCOUNTER — Other Ambulatory Visit: Payer: Self-pay

## 2021-07-15 DIAGNOSIS — E11621 Type 2 diabetes mellitus with foot ulcer: Secondary | ICD-10-CM

## 2021-07-15 DIAGNOSIS — L97522 Non-pressure chronic ulcer of other part of left foot with fat layer exposed: Secondary | ICD-10-CM | POA: Diagnosis not present

## 2021-07-16 ENCOUNTER — Encounter: Payer: Self-pay | Admitting: Podiatry

## 2021-07-16 NOTE — Progress Notes (Signed)
Subjective:  Patient ID: Jose [name], male    DOB: 1976/03/21,  MRN: [id number]  Chief Complaint  Patient presents with   Foot Ulcer    Left foot     45 y.o. male presents for wound care.  Patient presents with now follow-up of left hallux wound.  The right side still staying healed.  He has been doing Betadine wet-to-dry dressing changes.  He has been on his feet a lot more.   Review of Systems: Negative except as noted in the HPI. Denies N/V/F/Ch.  Past Medical History:  Diagnosis Date   GAD (generalized anxiety disorder)    Hyperlipidemia    Macular degeneration, bilateral    Retinopathy    Type II diabetes mellitus with complication, uncontrolled (HCC)    retinopathy, neuropathy, microalbuminuria    Current Outpatient Medications:    acetaminophen (TYLENOL) 500 MG tablet, Take 1,000 mg by mouth every 6 (six) hours as needed for mild pain, fever or headache., Disp: , Rfl:    amLODipine (NORVASC) 5 MG tablet, Take 5 mg by mouth daily., Disp: , Rfl:    amphetamine-dextroamphetamine (ADDERALL) 20 MG tablet, Take 1 tablet (20 mg total) by mouth 2 (two) times daily., Disp: 60 tablet, Rfl: 0   Ascorbic Acid (VITAMIN C PO), Take 1 tablet by mouth daily., Disp: , Rfl:    atorvastatin (LIPITOR) 40 MG tablet, Take 1 tablet (40 mg total) by mouth daily., Disp: 90 tablet, Rfl: 3   benzonatate (TESSALON) 100 MG capsule, Take 100-200 mg by mouth 3 (three) times daily as needed for cough., Disp: , Rfl:    Dulaglutide (TRULICITY) 3 0000000 SOPN, Inject 3 mg as directed once a week., Disp: 6 mL, Rfl: 3   DULoxetine (CYMBALTA) 60 MG capsule, TAKE 1 CAPSULE(60 MG) BY MOUTH DAILY, Disp: 30 capsule, Rfl: 2   furosemide (LASIX) 20 MG tablet, Take 20 mg by mouth daily., Disp: , Rfl:    gabapentin (NEURONTIN) 300 MG capsule, TAKE 1 TO 2 CAPSULES(300 TO 600 MG) BY MOUTH AT BEDTIME, Disp: 180 capsule, Rfl: 1   glimepiride (AMARYL) 1 MG tablet, Take 1 tablet (1 mg total) by mouth daily with  breakfast., Disp: 90 tablet, Rfl: 3   losartan (COZAAR) 50 MG tablet, TAKE 1 TABLET(50 MG) BY MOUTH DAILY, Disp: 30 tablet, Rfl: 0   metFORMIN (GLUCOPHAGE-XR) 500 MG 24 hr tablet, Take 1 tablet (500 mg total) by mouth daily., Disp: 90 tablet, Rfl: 3   Multiple Vitamin (MULTIVITAMIN ADULT PO), Take 1 tablet by mouth daily., Disp: , Rfl:    Promethazine HCl 6.25 MG/5ML SOLN, Take 10 mLs by mouth every 6 (six) hours as needed (nausea)., Disp: , Rfl:    Ranibizumab (LUCENTIS) 0.3 MG/0.05ML SOLN, by Intravitreal route. , Disp: , Rfl:    tadalafil (CIALIS) 20 MG tablet, TK 1 T PO  PO QD PRN FOR ERECTILE DYSFUNCTION, Disp: 10 tablet, Rfl: 11   tobramycin (TOBREX) 0.3 % ophthalmic solution, Place 1 drop into both eyes in the morning, at noon, in the evening, and at bedtime. Begin 1 day prior to treatment and continue the day of treatment and for one full day after treatment., Disp: , Rfl: 5   traZODone (DESYREL) 100 MG tablet, TAKE 1 TABLET(100 MG) BY MOUTH AT BEDTIME AS NEEDED FOR SLEEP, Disp: 90 tablet, Rfl: 0   verapamil (CALAN-SR) 120 MG CR tablet, Take 120 mg by mouth daily., Disp: , Rfl:    VITAMIN D PO, Take 1 tablet by mouth daily.,  Disp: , Rfl:   Social History   Tobacco Use  Smoking Status Former   Packs/day: 1.00   Years: 20.00   Pack years: 20.00   Types: Cigarettes   Quit date: 07/2020   Years since quitting: 0.9  Smokeless Tobacco Never    No Known Allergies Objective:  There were no vitals filed for this visit. There is no height or weight on file to calculate BMI. Constitutional Well developed. Well nourished.  Vascular Dorsalis pedis pulses failty palpable bilaterally. Posterior tibial pulses faintly palpable bilaterally. Capillary refill normal to all digits.  No cyanosis or clubbing noted. Pedal hair growth normal.  Neurologic Normal speech. Oriented to person, place, and time. Protective sensation absent  Dermatologic Wound Location: Left hallux wound wit limited to  the breakdown of the skin.  No clinical signs of infection noted.  No malodor present.  No purulent drainage noted. Wound Base: Mixed Granular/Fibrotic Peri-wound: Calloused Exudate: Scant/small amount Serosanguinous exudate Wound Measurements: -See below  Right hallux wound completely epithelialized no further infection noted or ulceration  Orthopedic: No pain to palpation either foot.   Radiographs: None Assessment:   1. Diabetic ulcer of toe of left foot associated with type 2 diabetes mellitus, with fat layer exposed ([place])           Plan:  Patient was evaluated and treated and all questions answered. Right hallux wound -Completely epithelialized.  No further ulceration noted  Ulcer left hallux with limited to the breakdown of the skin -Debridement as below. -Dressed with Betadine wet-to-dry, DSD. -Continue off-loading with surgical shoe.  Bilateral -I will hold off on any current graft treatment at this time.   Procedure: Excisional Debridement of Wound left hallux Tool: Sharp chisel blade/tissue nipper Rationale: Removal of non-viable soft tissue from the wound to promote healing.  Anesthesia: none Pre-Debridement Wound Measurements: 0.3cm x 0.3 cm x 0. 2 cm Post-Debridement Wound Measurements: 0.3cm x 0.3 cm x 0. 2 cm Type of Debridement: Sharp Excisional Tissue Removed: Non-viable soft tissue Blood loss: Minimal (<50cc) Depth of Debridement: subcutaneous tissue. Technique: Sharp excisional debridement to bleeding, viable wound base.  Wound Progress: The measurements are stagnant.   Dressing: Dry, sterile, compression dressing. Disposition: Patient tolerated procedure well. Patient to return in 1 week for follow-up.   No follow-ups on file.  No follow-ups on file.

## 2021-07-22 ENCOUNTER — Emergency Department (HOSPITAL_COMMUNITY): Payer: 59

## 2021-07-22 ENCOUNTER — Encounter (HOSPITAL_COMMUNITY): Payer: Self-pay | Admitting: *Deleted

## 2021-07-22 ENCOUNTER — Inpatient Hospital Stay (HOSPITAL_COMMUNITY)
Admission: EM | Admit: 2021-07-22 | Discharge: 2021-08-03 | DRG: 478 | Disposition: A | Discharge status: Home Health | Payer: 59 | Attending: Family Medicine | Admitting: Family Medicine

## 2021-07-22 ENCOUNTER — Other Ambulatory Visit: Payer: Self-pay

## 2021-07-22 DIAGNOSIS — L97529 Non-pressure chronic ulcer of other part of left foot with unspecified severity: Secondary | ICD-10-CM | POA: Diagnosis not present

## 2021-07-22 DIAGNOSIS — H3530 Unspecified macular degeneration: Secondary | ICD-10-CM | POA: Diagnosis present

## 2021-07-22 DIAGNOSIS — E11319 Type 2 diabetes mellitus with unspecified diabetic retinopathy without macular edema: Secondary | ICD-10-CM | POA: Diagnosis present

## 2021-07-22 DIAGNOSIS — M8448XA Pathological fracture, other site, initial encounter for fracture: Secondary | ICD-10-CM | POA: Diagnosis present

## 2021-07-22 DIAGNOSIS — Q2112 Patent foramen ovale: Secondary | ICD-10-CM

## 2021-07-22 DIAGNOSIS — R0602 Shortness of breath: Secondary | ICD-10-CM

## 2021-07-22 DIAGNOSIS — E1142 Type 2 diabetes mellitus with diabetic polyneuropathy: Secondary | ICD-10-CM | POA: Diagnosis present

## 2021-07-22 DIAGNOSIS — D638 Anemia in other chronic diseases classified elsewhere: Secondary | ICD-10-CM | POA: Diagnosis present

## 2021-07-22 DIAGNOSIS — E11621 Type 2 diabetes mellitus with foot ulcer: Secondary | ICD-10-CM | POA: Diagnosis not present

## 2021-07-22 DIAGNOSIS — E785 Hyperlipidemia, unspecified: Secondary | ICD-10-CM | POA: Diagnosis present

## 2021-07-22 DIAGNOSIS — Z515 Encounter for palliative care: Secondary | ICD-10-CM | POA: Diagnosis not present

## 2021-07-22 DIAGNOSIS — L02232 Carbuncle of back [any part, except buttock]: Secondary | ICD-10-CM | POA: Diagnosis present

## 2021-07-22 DIAGNOSIS — N1831 Chronic kidney disease, stage 3a: Secondary | ICD-10-CM | POA: Diagnosis present

## 2021-07-22 DIAGNOSIS — D649 Anemia, unspecified: Secondary | ICD-10-CM | POA: Diagnosis not present

## 2021-07-22 DIAGNOSIS — Z79899 Other long term (current) drug therapy: Secondary | ICD-10-CM

## 2021-07-22 DIAGNOSIS — M869 Osteomyelitis, unspecified: Principal | ICD-10-CM | POA: Diagnosis present

## 2021-07-22 DIAGNOSIS — K5900 Constipation, unspecified: Secondary | ICD-10-CM | POA: Diagnosis present

## 2021-07-22 DIAGNOSIS — M899 Disorder of bone, unspecified: Secondary | ICD-10-CM

## 2021-07-22 DIAGNOSIS — Z8616 Personal history of COVID-19: Secondary | ICD-10-CM

## 2021-07-22 DIAGNOSIS — J189 Pneumonia, unspecified organism: Secondary | ICD-10-CM | POA: Diagnosis present

## 2021-07-22 DIAGNOSIS — Z20822 Contact with and (suspected) exposure to covid-19: Secondary | ICD-10-CM | POA: Diagnosis present

## 2021-07-22 DIAGNOSIS — N179 Acute kidney failure, unspecified: Secondary | ICD-10-CM | POA: Diagnosis present

## 2021-07-22 DIAGNOSIS — L0591 Pilonidal cyst without abscess: Secondary | ICD-10-CM | POA: Diagnosis present

## 2021-07-22 DIAGNOSIS — F411 Generalized anxiety disorder: Secondary | ICD-10-CM | POA: Diagnosis not present

## 2021-07-22 DIAGNOSIS — E8770 Fluid overload, unspecified: Secondary | ICD-10-CM | POA: Diagnosis present

## 2021-07-22 DIAGNOSIS — R531 Weakness: Secondary | ICD-10-CM | POA: Diagnosis not present

## 2021-07-22 DIAGNOSIS — R52 Pain, unspecified: Secondary | ICD-10-CM | POA: Diagnosis not present

## 2021-07-22 DIAGNOSIS — E1122 Type 2 diabetes mellitus with diabetic chronic kidney disease: Secondary | ICD-10-CM | POA: Diagnosis present

## 2021-07-22 DIAGNOSIS — Z7189 Other specified counseling: Secondary | ICD-10-CM | POA: Diagnosis not present

## 2021-07-22 DIAGNOSIS — Z9049 Acquired absence of other specified parts of digestive tract: Secondary | ICD-10-CM

## 2021-07-22 DIAGNOSIS — I129 Hypertensive chronic kidney disease with stage 1 through stage 4 chronic kidney disease, or unspecified chronic kidney disease: Secondary | ICD-10-CM | POA: Diagnosis present

## 2021-07-22 DIAGNOSIS — Z818 Family history of other mental and behavioral disorders: Secondary | ICD-10-CM

## 2021-07-22 DIAGNOSIS — M546 Pain in thoracic spine: Secondary | ICD-10-CM | POA: Diagnosis not present

## 2021-07-22 DIAGNOSIS — Z87891 Personal history of nicotine dependence: Secondary | ICD-10-CM

## 2021-07-22 DIAGNOSIS — B9561 Methicillin susceptible Staphylococcus aureus infection as the cause of diseases classified elsewhere: Secondary | ICD-10-CM | POA: Diagnosis present

## 2021-07-22 DIAGNOSIS — Z83438 Family history of other disorder of lipoprotein metabolism and other lipidemia: Secondary | ICD-10-CM

## 2021-07-22 DIAGNOSIS — F909 Attention-deficit hyperactivity disorder, unspecified type: Secondary | ICD-10-CM | POA: Diagnosis present

## 2021-07-22 DIAGNOSIS — E1165 Type 2 diabetes mellitus with hyperglycemia: Secondary | ICD-10-CM | POA: Diagnosis present

## 2021-07-22 DIAGNOSIS — Z794 Long term (current) use of insulin: Secondary | ICD-10-CM | POA: Diagnosis present

## 2021-07-22 DIAGNOSIS — M62838 Other muscle spasm: Secondary | ICD-10-CM | POA: Diagnosis present

## 2021-07-22 DIAGNOSIS — M898X9 Other specified disorders of bone, unspecified site: Secondary | ICD-10-CM

## 2021-07-22 DIAGNOSIS — R7881 Bacteremia: Secondary | ICD-10-CM | POA: Diagnosis present

## 2021-07-22 DIAGNOSIS — E871 Hypo-osmolality and hyponatremia: Secondary | ICD-10-CM | POA: Diagnosis not present

## 2021-07-22 DIAGNOSIS — Z7984 Long term (current) use of oral hypoglycemic drugs: Secondary | ICD-10-CM

## 2021-07-22 DIAGNOSIS — Z833 Family history of diabetes mellitus: Secondary | ICD-10-CM

## 2021-07-22 LAB — ABO/RH: ABO/RH(D): A POS

## 2021-07-22 LAB — URINALYSIS, ROUTINE W REFLEX MICROSCOPIC
Bacteria, UA: NONE SEEN
Bilirubin Urine: NEGATIVE
Glucose, UA: NEGATIVE mg/dL
Ketones, ur: NEGATIVE mg/dL
Leukocytes,Ua: NEGATIVE
Nitrite: NEGATIVE
Protein, ur: 100 mg/dL — AB
Specific Gravity, Urine: 1.015 (ref 1.005–1.030)
pH: 6 (ref 5.0–8.0)

## 2021-07-22 LAB — BASIC METABOLIC PANEL
Anion gap: 9 (ref 5–15)
BUN: 25 mg/dL — ABNORMAL HIGH (ref 6–20)
CO2: 27 mmol/L (ref 22–32)
Calcium: 9.4 mg/dL (ref 8.9–10.3)
Chloride: 98 mmol/L (ref 98–111)
Creatinine, Ser: 1.99 mg/dL — ABNORMAL HIGH (ref 0.61–1.24)
GFR, Estimated: 41 mL/min — ABNORMAL LOW (ref >60)
Glucose, Bld: 184 mg/dL — ABNORMAL HIGH (ref 70–99)
Potassium: 4.8 mmol/L (ref 3.5–5.1)
Sodium: 134 mmol/L — ABNORMAL LOW (ref 135–145)

## 2021-07-22 LAB — CBC
HCT: 23 % — ABNORMAL LOW (ref 39.0–52.0)
Hemoglobin: 7.5 g/dL — ABNORMAL LOW (ref 13.0–17.0)
MCH: 28.1 pg (ref 26.0–34.0)
MCHC: 32.6 g/dL (ref 30.0–36.0)
MCV: 86.1 fL (ref 80.0–100.0)
Platelets: 405 10*3/uL — ABNORMAL HIGH (ref 150–400)
RBC: 2.67 MIL/uL — ABNORMAL LOW (ref 4.22–5.81)
RDW: 12.5 % (ref 11.5–15.5)
WBC: 14.3 10*3/uL — ABNORMAL HIGH (ref 4.0–10.5)
nRBC: 0 % (ref 0.0–0.2)

## 2021-07-22 LAB — TROPONIN I (HIGH SENSITIVITY)
Troponin I (High Sensitivity): 5 ng/L (ref <18)
Troponin I (High Sensitivity): 5 ng/L (ref <18)

## 2021-07-22 LAB — D-DIMER, QUANTITATIVE: D-Dimer, Quant: 1.1 ug/mL-FEU — ABNORMAL HIGH (ref 0.00–0.50)

## 2021-07-22 LAB — POC OCCULT BLOOD, ED: Fecal Occult Bld: NEGATIVE

## 2021-07-22 LAB — PREPARE RBC (CROSSMATCH)

## 2021-07-22 LAB — GLUCOSE, CAPILLARY: Glucose-Capillary: 155 mg/dL — ABNORMAL HIGH (ref 70–99)

## 2021-07-22 LAB — LACTIC ACID, PLASMA: Lactic Acid, Venous: 0.8 mmol/L (ref 0.5–1.9)

## 2021-07-22 LAB — CBG MONITORING, ED: Glucose-Capillary: 130 mg/dL — ABNORMAL HIGH (ref 70–99)

## 2021-07-22 IMAGING — CT CT CHEST W/O CM
2 of 4 series · 15 of 36 positions shown, 18 images · non-contrast
Comparison: None.

CLINICAL DATA: Chest pain

EXAM:
CT CHEST WITHOUT CONTRAST
TECHNIQUE: Multidetector CT imaging of the chest was performed following the
standard protocol without IV contrast.

[Series 2: thorax · axial · 0.78mm/px · z∈[-288,-20]mm · 12 of 160 slices shown, 15 images]
[im 13/160  mediastinal]
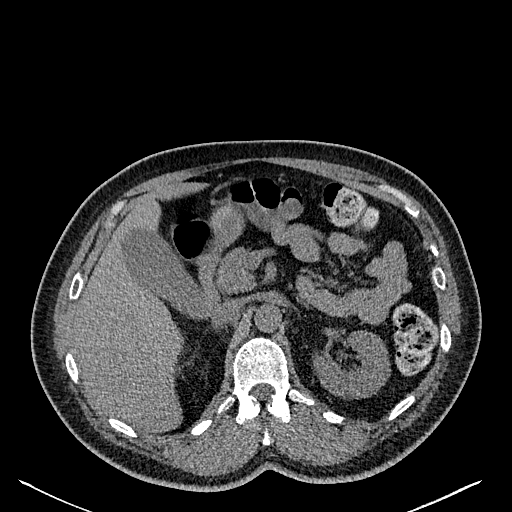
[im 13/160  lung]
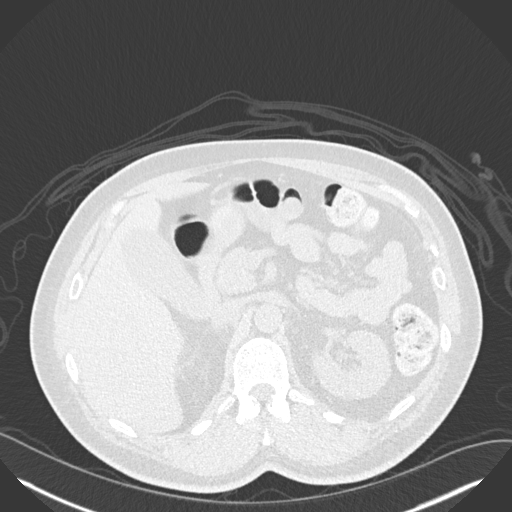
[im 25/160  lung]
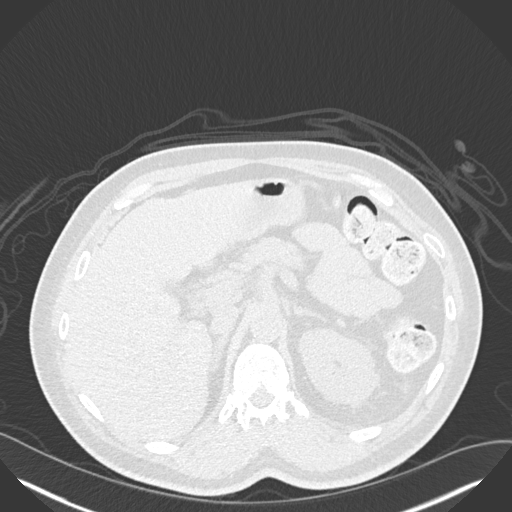
[im 37/160  lung]
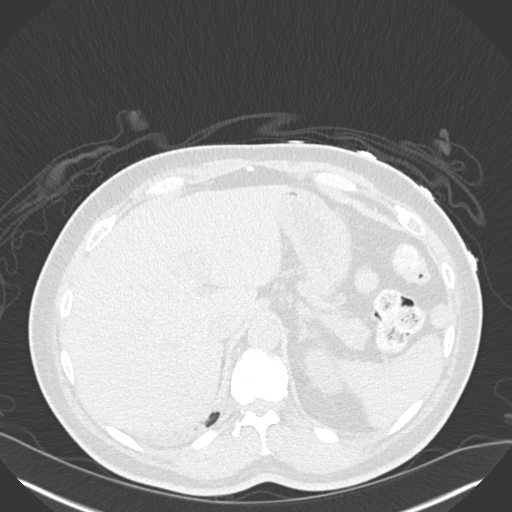
[im 49/160  lung]
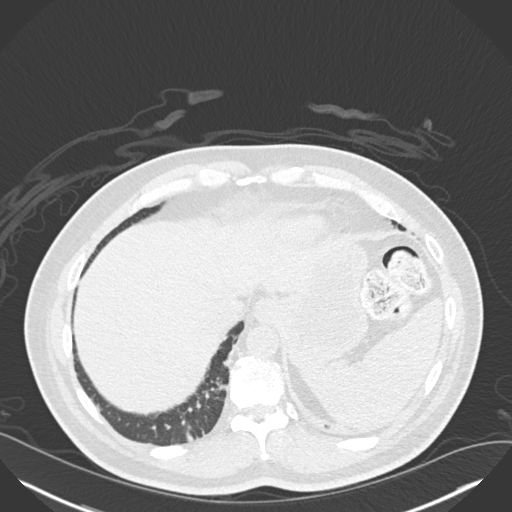
[im 62/160  mediastinal]
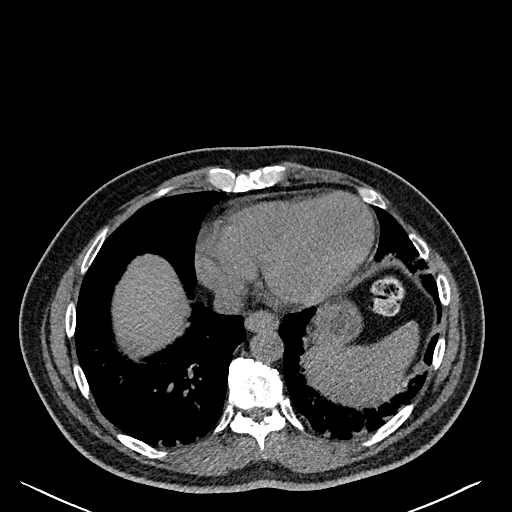
[im 62/160  lung]
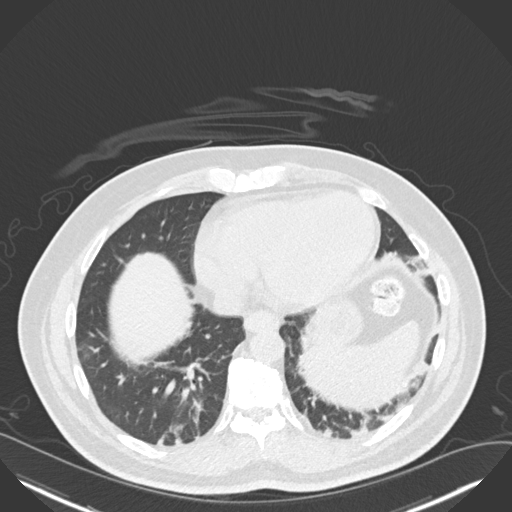
[im 74/160  lung]
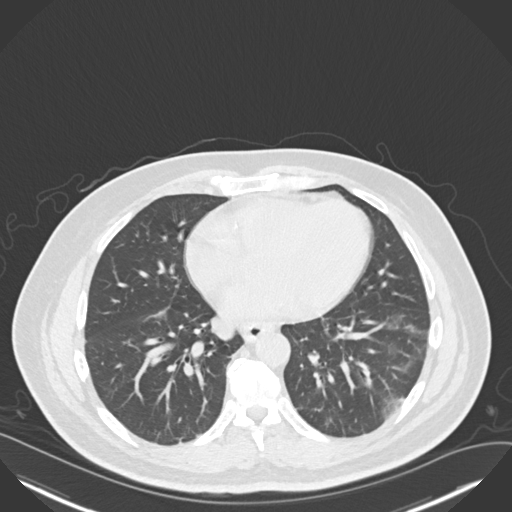
[im 86/160  lung]
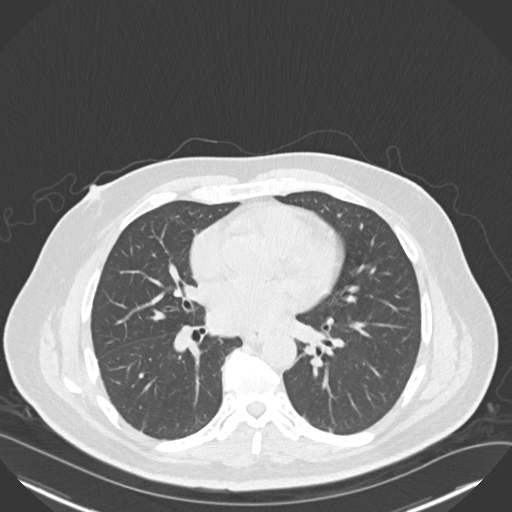
[im 98/160  lung]
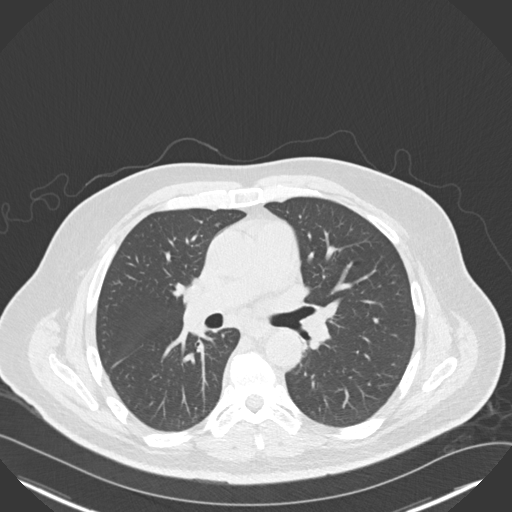
[im 111/160  mediastinal]
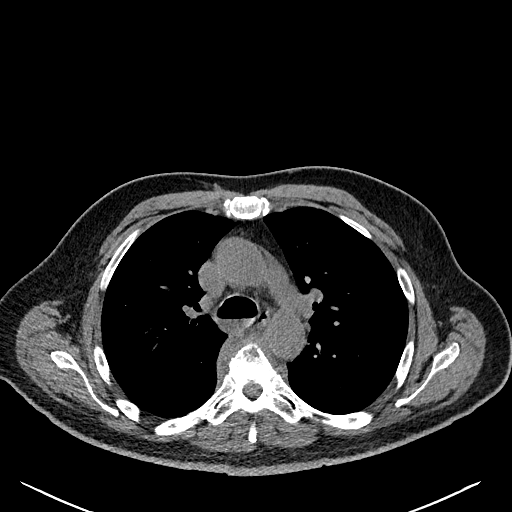
[im 111/160  lung]
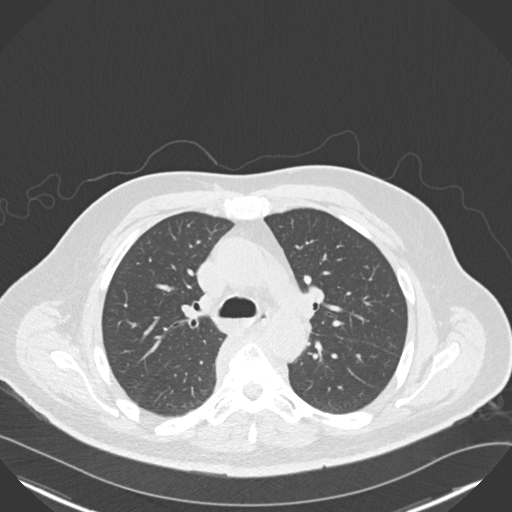
[im 123/160  lung]
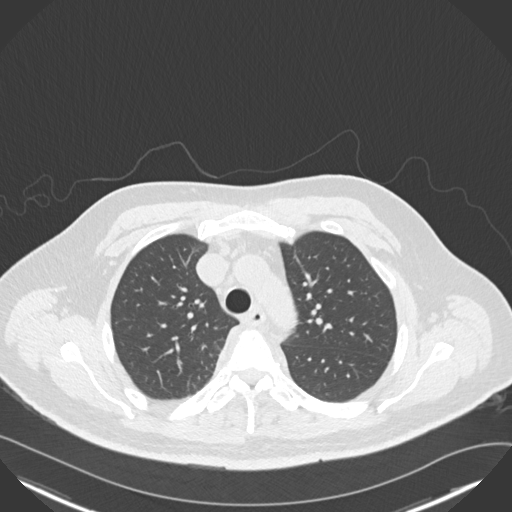
[im 135/160  lung]
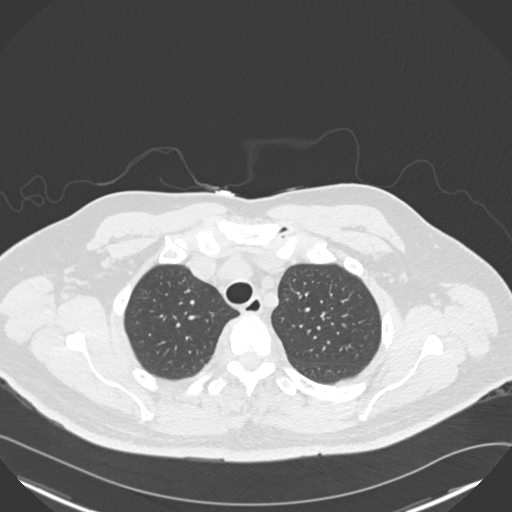
[im 147/160  lung]
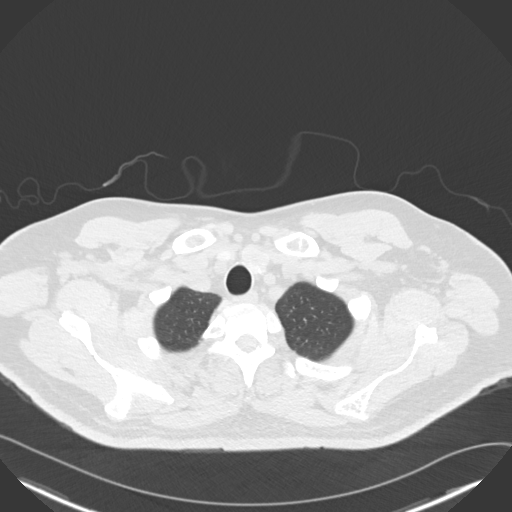

[Series 5: coronal · coronal · 0.67mm/px · 3 of 147 slices shown]
[im 30/147  lung]
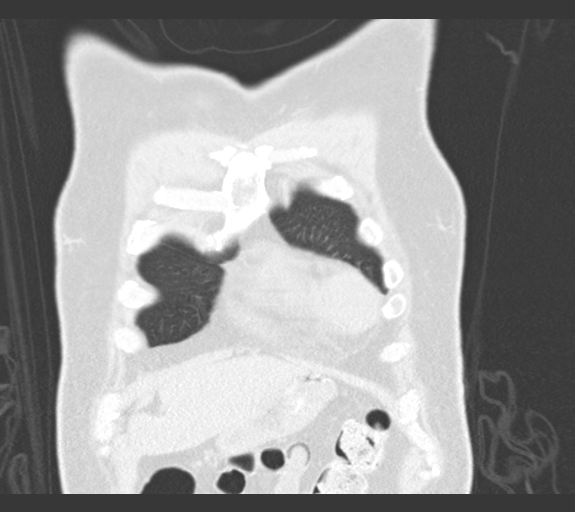
[im 59/147  lung]
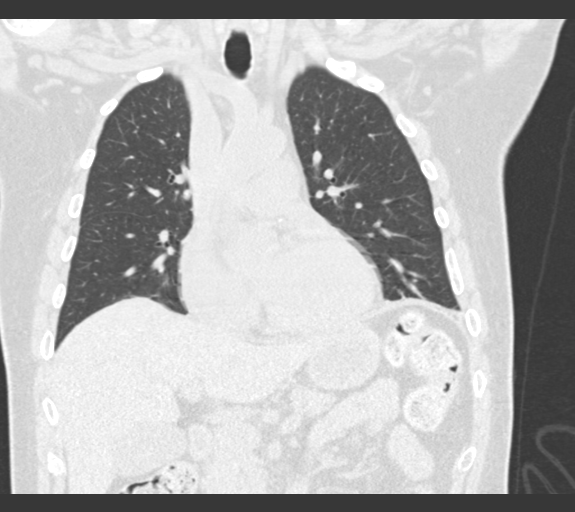
[im 88/147  lung]
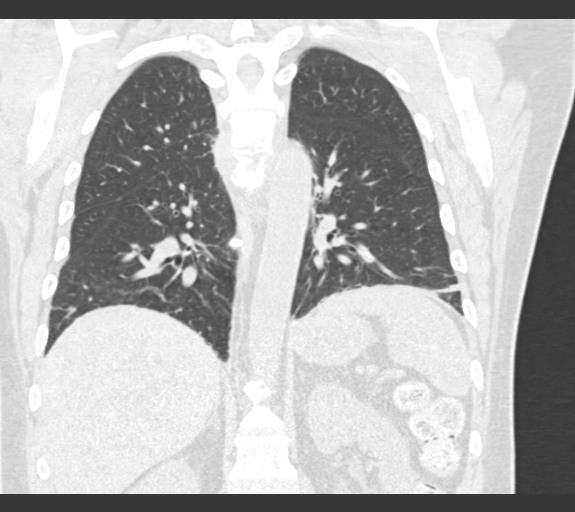

[15 of 36 positions shown; findings below may reference images not displayed]

FINDINGS: Cardiovascular: The heart is within normal limits in size. No
pericardial effusion the aorta is normal in caliber. Minimal
scattered atherosclerotic calcifications. Age advanced coronary
artery calcifications.

Mediastinum/Nodes: Small scattered mediastinal and hilar lymph nodes
but no mass or overt adenopathy. Borderline enlarged subcarinal node
measuring 12 mm could be related to the thoracic spine process. The
esophagus is grossly normal.

Lungs/Pleura: Areas of streaky bibasilar atelectasis. No focal
airspace consolidation/pneumonia. No worrisome pulmonary lesions. No
pleural effusions or pleural lesions.

Upper Abdomen: No significant upper abdominal findings.

Musculoskeletal: No chest wall mass, supraclavicular or axillary
adenopathy. There are several small bilateral thyroid nodules. None
of these measures greater than 12 mm. Not clinically significant; no
follow-up imaging recommended (ref: [HOSPITAL]. [DATE]): 143-50). There are lytic destructive bone lesions
involving the T5 and T6 vertebral bodies with a mild pathologic
compression fracture of T6. Paraspinal hematoma or tumor noted at
both these levels. No definite findings for spinal canal compromise.

Mild lucency involving the sternum could also reflect lytic bone
disease. I do not see any obvious rib lesions or pathologic rib
fractures.
IMPRESSION: 1. Lytic bone disease involving T5 and T6 and possibly the sternum.
There is a pathologic fracture of T6 with paraspinal hematoma or
tumor. This could represent metastatic bone disease or possibly
myeloma.
2. No obvious primary neoplasm involving the chest or upper abdomen.
PET-CT may be helpful for further evaluation and extent of the bone
disease and possibly detecting a primary neoplasm.
3. Thoracic spine MRI without and with contrast may be helpful for
further evaluation of the thoracic disease. I do not see any any
obvious spinal canal compromise.

Aortic Atherosclerosis ([PO]-[PO]).

## 2021-07-22 IMAGING — CR DG CHEST 2V
2 series · 2 of 2 positions shown · non-contrast
Comparison: None.

CLINICAL DATA: chest pain

EXAM:
CHEST - 2 VIEW

[w chest lat]
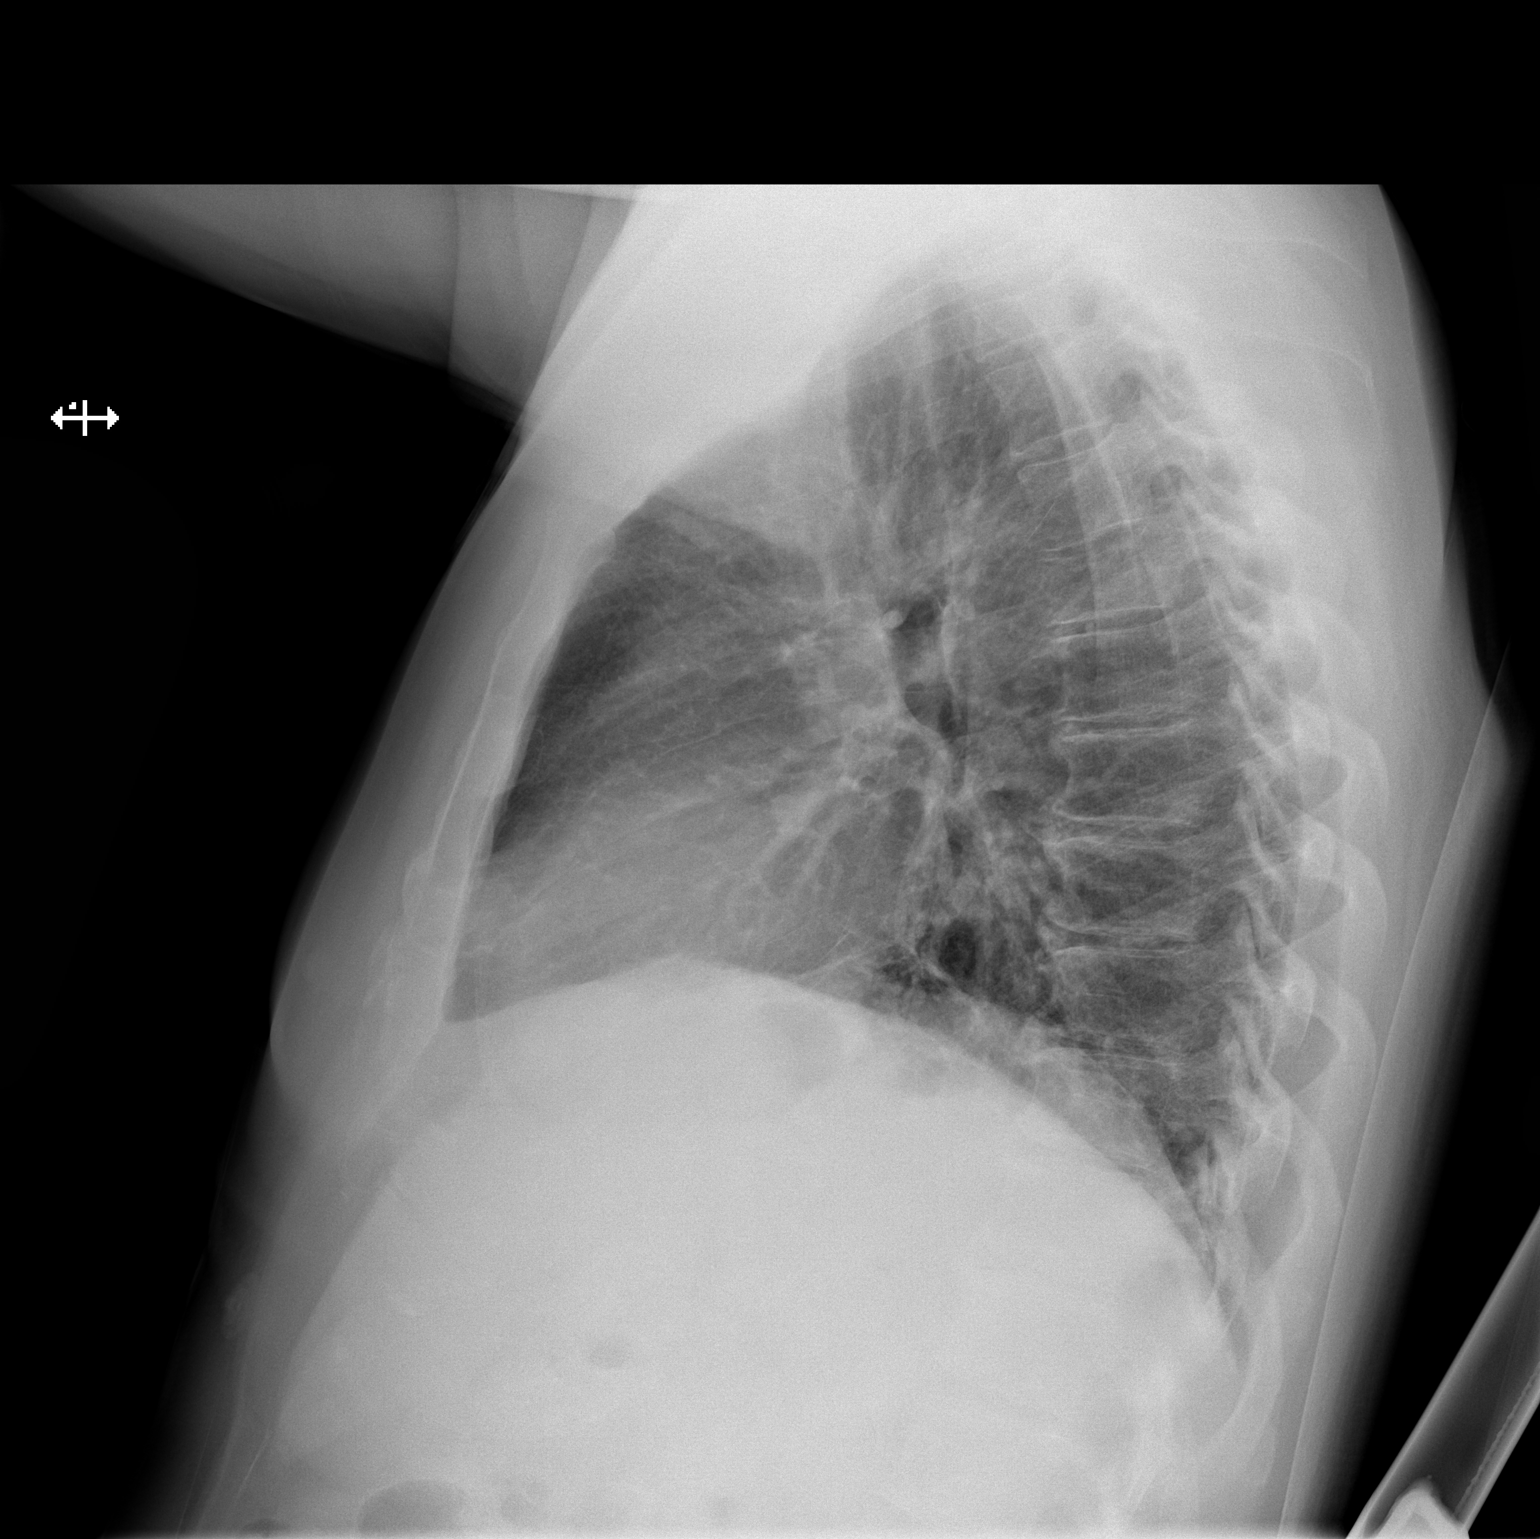

[x chest ap]
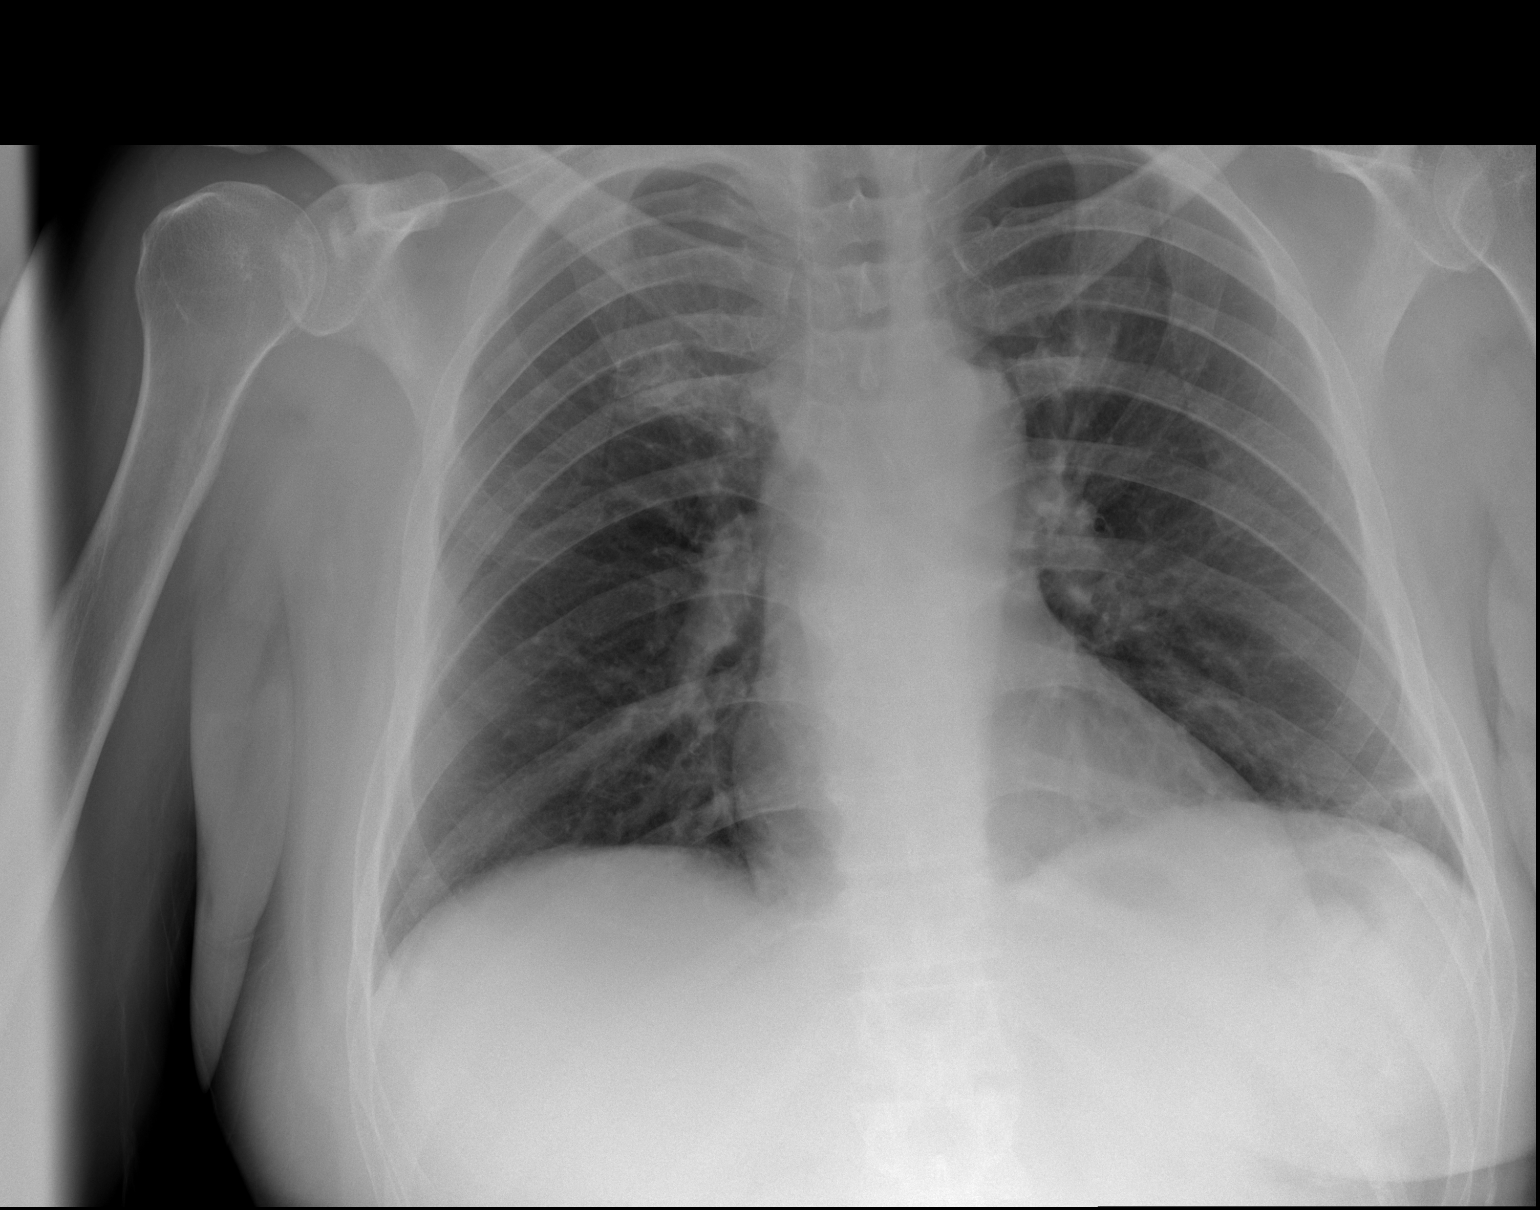

[2 of 2 positions shown; findings below may reference images not displayed]

FINDINGS: The cardiomediastinal silhouette is within normal limits. No pleural
effusion. No pneumothorax. No lobar consolidation. Minimal left base
subsegmental atelectasis. No acute osseous abnormality.
IMPRESSION: Minimal left lung base subsegmental atelectasis. No other acute
abnormality in the chest.

## 2021-07-22 MED ORDER — HYDROMORPHONE HCL 1 MG/ML IJ SOLN
0.5000 mg | INTRAMUSCULAR | Status: DC | PRN
Start: 2021-07-22 — End: 2021-07-23
  Administered 2021-07-22 – 2021-07-23 (×4): 0.5 mg via INTRAVENOUS
  Filled 2021-07-22 (×4): qty 0.5

## 2021-07-22 MED ORDER — HYDRALAZINE HCL 20 MG/ML IJ SOLN
10.0000 mg | Freq: Four times a day (QID) | INTRAMUSCULAR | Status: DC | PRN
  Administered 2021-07-22 – 2021-07-27 (×3): 10 mg via INTRAVENOUS
  Filled 2021-07-22 (×3): qty 1

## 2021-07-22 MED ORDER — INSULIN ASPART 100 UNIT/ML IJ SOLN: 0.0000 [IU] | Freq: Every day | INTRAMUSCULAR | Status: DC

## 2021-07-22 MED ORDER — ALPRAZOLAM 0.25 MG PO TABS
0.2500 mg | ORAL_TABLET | Freq: Two times a day (BID) | ORAL | Status: DC | PRN
  Administered 2021-07-23 – 2021-07-25 (×2): 0.25 mg via ORAL
  Filled 2021-07-22 (×2): qty 1

## 2021-07-22 MED ORDER — ATORVASTATIN CALCIUM 40 MG PO TABS
40.0000 mg | ORAL_TABLET | Freq: Every day | ORAL | Status: DC
  Administered 2021-07-22 – 2021-08-03 (×13): 40 mg via ORAL
  Filled 2021-07-22 (×13): qty 1

## 2021-07-22 MED ORDER — ALBUTEROL SULFATE (2.5 MG/3ML) 0.083% IN NEBU: 2.5000 mg | INHALATION_SOLUTION | Freq: Four times a day (QID) | RESPIRATORY_TRACT | Status: DC | PRN

## 2021-07-22 MED ORDER — ACETAMINOPHEN 325 MG PO TABS: 650.0000 mg | ORAL_TABLET | Freq: Four times a day (QID) | ORAL | Status: DC | PRN

## 2021-07-22 MED ORDER — ACETAMINOPHEN 650 MG RE SUPP: 650.0000 mg | Freq: Four times a day (QID) | RECTAL | Status: DC | PRN

## 2021-07-22 MED ORDER — INSULIN ASPART 100 UNIT/ML IJ SOLN
0.0000 [IU] | Freq: Three times a day (TID) | INTRAMUSCULAR | Status: DC
  Administered 2021-07-23 (×2): 1 [IU] via SUBCUTANEOUS
  Administered 2021-07-23: 5 [IU] via SUBCUTANEOUS
  Administered 2021-07-24: 1 [IU] via SUBCUTANEOUS
  Administered 2021-07-24: 3 [IU] via SUBCUTANEOUS
  Administered 2021-07-24: 1 [IU] via SUBCUTANEOUS
  Administered 2021-07-25: 2 [IU] via SUBCUTANEOUS
  Administered 2021-07-25: 1 [IU] via SUBCUTANEOUS
  Administered 2021-07-25 – 2021-07-26 (×4): 2 [IU] via SUBCUTANEOUS
  Administered 2021-07-27: 1 [IU] via SUBCUTANEOUS

## 2021-07-22 MED ORDER — OXYCODONE HCL 5 MG PO TABS
5.0000 mg | ORAL_TABLET | ORAL | Status: DC | PRN
  Administered 2021-07-23: 5 mg via ORAL
  Filled 2021-07-22: qty 1

## 2021-07-22 MED ORDER — TECHNETIUM TO 99M ALBUMIN AGGREGATED
4.4000 | Freq: Once | INTRAVENOUS | Status: AC
  Administered 2021-07-22: 4.4 via INTRAVENOUS

## 2021-07-22 MED ORDER — CYCLOBENZAPRINE HCL 10 MG PO TABS
10.0000 mg | ORAL_TABLET | Freq: Two times a day (BID) | ORAL | Status: DC
  Administered 2021-07-22 – 2021-08-03 (×24): 10 mg via ORAL
  Filled 2021-07-22 (×24): qty 1

## 2021-07-22 MED ORDER — GABAPENTIN 300 MG PO CAPS
300.0000 mg | ORAL_CAPSULE | Freq: Every day | ORAL | Status: DC
  Administered 2021-07-22: 300 mg via ORAL
  Filled 2021-07-22: qty 1

## 2021-07-22 MED ORDER — VERAPAMIL HCL ER 120 MG PO TBCR
120.0000 mg | EXTENDED_RELEASE_TABLET | Freq: Every day | ORAL | Status: DC
  Administered 2021-07-22 – 2021-08-03 (×13): 120 mg via ORAL
  Filled 2021-07-22 (×13): qty 1

## 2021-07-22 MED ORDER — SODIUM CHLORIDE 0.9 % IV SOLN
10.0000 mL/h | Freq: Once | INTRAVENOUS | Status: DC
Start: 2021-07-22 — End: 2021-07-23

## 2021-07-22 MED ORDER — SODIUM CHLORIDE 0.9 % IV BOLUS
1000.0000 mL | Freq: Once | INTRAVENOUS | Status: AC
  Administered 2021-07-22: 1000 mL via INTRAVENOUS

## 2021-07-22 MED ORDER — SODIUM CHLORIDE 0.9 % IV SOLN: INTRAVENOUS | Status: DC

## 2021-07-22 MED ORDER — LIDOCAINE 5 % EX PTCH: 1.0000 | MEDICATED_PATCH | CUTANEOUS | Status: DC

## 2021-07-22 MED ORDER — ALBUTEROL SULFATE (2.5 MG/3ML) 0.083% IN NEBU: 2.5000 mg | INHALATION_SOLUTION | Freq: Four times a day (QID) | RESPIRATORY_TRACT | Status: DC

## 2021-07-22 MED ORDER — HEPARIN SODIUM (PORCINE) 5000 UNIT/ML IJ SOLN
5000.0000 [IU] | Freq: Three times a day (TID) | INTRAMUSCULAR | Status: DC
  Administered 2021-07-22 – 2021-07-27 (×15): 5000 [IU] via SUBCUTANEOUS
  Filled 2021-07-22 (×15): qty 1

## 2021-07-22 NOTE — ED Notes (Signed)
Occult blood negative

## 2021-07-22 NOTE — H&P (Signed)
[facility] History and Physical  Jose [name] KKX:[id number] DOB: 1976-02-03 DOA: 07/22/2021 PCP: [name], NP  Admitted from: Home Chief Complaint: Chest pain, back pain  History of Present Illness: Jose [name] is a 45 y.o. male with PMH significant for DM2 with complications including retinopathy, neuropathy, microalbuminuria; HTN, HLD, CKD 3  (baseline creatinine 1.1-1.2), COVID-19 infection in July 2022. Patient presented to the ED today 9/28 with complaint of chest pain, radiating to both shoulder blades for 6 weeks.  He went to his PCP and chiropractor, had x-rays that showed abnormal curvature in the spine.  He was ordered for an MRI which is scheduled for October.  With worsening symptoms, he decided to come to ED today.  In the ED, patient was afebrile, heart rate in 90s and 100s, blood pressure elevated up to 160s, breathing on room air Labs with WBC count elevated to 14.3, hemoglobin low at 7.5 with MCV 86, platelets 405, sodium 134, BUN/creatinine 25/1.99, troponin normal x 2, lactic acid level normal at 0.8 D-dimer is elevated slightly to 1.1 Chest x-ray showed minimal left lung base atelectasis. EKG with normal sinus rhythm in 90s.  No significant ST-T wave changes CT chest without contrast showed significant findings as below: 1. 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 There is a pathologic fracture of T6 with paraspinal hematoma or tumor. This could represent metastatic bone disease or possibly myeloma. 2. No obvious primary neoplasm involving the chest or upper abdomen. PET-CT may be helpful for further evaluation and extent of the bone disease and possibly detecting a primary neoplasm. 3. Thoracic spine MRI without and with contrast may be helpful for further evaluation of the thoracic disease.  No obvious neurological sign of spinal canal compromise.  With the above findings, hospitalist service was consulted for further  evaluation management.  At the time of my evaluation, patient was lying on bed.  Pain was partially controlled.  He was anxious with the new overwhelming information he just received about the potential malignancy.  Family was not at bedside.   Review of Systems:  All systems were reviewed and were negative unless otherwise mentioned in the HPI   Past medical history: Past Medical History:  Diagnosis Date   GAD (generalized anxiety disorder)    Hyperlipidemia    Macular degeneration, bilateral    Retinopathy    Type II diabetes mellitus with complication, uncontrolled ([place])    retinopathy, neuropathy, microalbuminuria    Past surgical history: Past Surgical History:  Procedure Laterality Date   APPENDECTOMY     HERNIA REPAIR     TRIGGER FINGER RELEASE Right 10/25/2019   Procedure: RIGHT INDEX FINGER RELEASE TRIGGER FINGER/A-1 PULLEY;  Surgeon: Daryll Brod, MD;  Location: [place];  Service: Orthopedics;  Laterality: Right;  IV [facility] FOREARM BLOCK    Social History:  reports that he quit smoking about a year ago. His smoking use included cigarettes. He has a 20.00 pack-year smoking history. He has never used smokeless tobacco. He reports that he does not currently use alcohol. He reports that he does not use drugs.  Allergies:  No Known Allergies  Family history:  Family History  Problem Relation Age of Onset   Diabetes Mother    Hyperlipidemia Mother    Stroke Mother    Diabetes Father    Hyperlipidemia Brother    Stroke Brother    ADD / ADHD Brother    ADD / ADHD Son  Home Meds: Prior to Admission medications   Medication Sig Start Date End Date Taking? Authorizing Provider  acetaminophen (TYLENOL) 500 MG tablet Take 1,000 mg by mouth every 6 (six) hours as needed for mild pain, fever or headache.    [provider]  albuterol (VENTOLIN HFA) 108 (90 Base) MCG/ACT inhaler Inhale 1 puff into the lungs every 4 (four) hours as needed  for wheezing or shortness of breath. 05/27/21   [provider]  ALPRAZolam Duanne Moron) 0.25 MG tablet Take 0.25 mg by mouth 2 (two) times daily as needed for anxiety. 06/12/21   [provider]  amLODipine (NORVASC) 5 MG tablet Take 5 mg by mouth daily. 03/18/21   [provider]  amphetamine-dextroamphetamine (ADDERALL) 20 MG tablet Take 1 tablet (20 mg total) by mouth 2 (two) times daily. 03/05/21 07/04/21  Elvin So, MD  Ascorbic Acid (VITAMIN C PO) Take 1 tablet by mouth daily.    [provider]  atorvastatin (LIPITOR) 40 MG tablet Take 1 tablet (40 mg total) by mouth daily. 03/12/19   Daleen Squibb, MD  benzonatate (TESSALON) 100 MG capsule Take 100-200 mg by mouth 3 (three) times daily as needed for cough. 05/15/21   [provider]  cyclobenzaprine (FLEXERIL) 10 MG tablet Take 10 mg by mouth 2 (two) times daily. 07/09/21   [provider]  Dulaglutide (TRULICITY) 3 BW/3.8LH SOPN Inject 3 mg as directed once a week. 06/11/21   Renato Shin, MD  DULoxetine (CYMBALTA) 60 MG capsule TAKE 1 CAPSULE(60 MG) BY MOUTH DAILY 03/05/21   Elvin So, MD  furosemide (LASIX) 20 MG tablet Take 20 mg by mouth daily. 04/30/21   [provider]  gabapentin (NEURONTIN) 300 MG capsule TAKE 1 TO 2 CAPSULES(300 TO 600 MG) BY MOUTH AT BEDTIME 12/01/19   Jacelyn Pi, Lilia Argue, MD  glimepiride (AMARYL) 1 MG tablet Take 1 tablet (1 mg total) by mouth daily with breakfast. 01/08/21   Renato Shin, MD  HYDROcodone-acetaminophen (NORCO/VICODIN) 5-325 MG tablet Take 1 tablet by mouth 2 (two) times daily as needed for pain. 07/07/21   [provider]  losartan (COZAAR) 50 MG tablet TAKE 1 TABLET(50 MG) BY MOUTH DAILY 07/21/20   Jacelyn Pi, Lilia Argue, MD  metFORMIN (GLUCOPHAGE-XR) 500 MG 24 hr tablet Take 1 tablet (500 mg total) by mouth daily. 06/11/21   Renato Shin, MD  Multiple Vitamin (MULTIVITAMIN ADULT PO) Take 1 tablet by mouth daily.     [provider]  oxyCODONE (OXY IR/ROXICODONE) 5 MG immediate release tablet Take 5 mg by mouth 2 (two) times daily as needed for pain. 07/21/21   [provider]  Promethazine HCl 6.25 MG/5ML SOLN Take 10 mLs by mouth every 6 (six) hours as needed (nausea). 05/18/21   [provider]  Ranibizumab (LUCENTIS) 0.3 MG/0.05ML SOLN by Intravitreal route.     [provider]  tadalafil (CIALIS) 20 MG tablet TK 1 T PO  PO QD PRN FOR ERECTILE DYSFUNCTION 07/20/19   Jacelyn Pi, Lilia Argue, MD  tobramycin (TOBREX) 0.3 % ophthalmic solution Place 1 drop into both eyes in the morning, at noon, in the evening, and at bedtime. Begin 1 day prior to treatment and continue the day of treatment and for one full day after treatment. 07/05/18   [provider]  traZODone (DESYREL) 100 MG tablet TAKE 1 TABLET(100 MG) BY MOUTH AT BEDTIME AS NEEDED FOR SLEEP 03/05/21   Ravi, Himabindu, MD  verapamil (CALAN-SR) 120 MG CR  tablet Take 120 mg by mouth daily. 04/17/21   [provider]  VITAMIN D PO Take 1 tablet by mouth daily.    [provider]    Physical Exam: Vitals:   07/22/21 1430 07/22/21 1445 07/22/21 1500 07/22/21 1700  BP: (!) 168/94 (!) 162/97 (!) 163/94 (!) 195/100  Pulse: (!) 104 (!) 105 (!) 102 (!) 107  Resp:   18 20  Temp:      TempSrc:      SpO2: 100% 98% 99%    Wt Readings from Last 3 Encounters:  06/11/21 87.2 kg  05/21/21 88.2 kg  03/10/21 91.3 kg   There is no height or weight on file to calculate BMI.  General exam: Pleasant middle-aged Caucasian male.  In mild to moderate distress because of chest pain and back pain Skin: No rashes, lesions or ulcers. HEENT: Atraumatic, normocephalic, no obvious bleeding Lungs: Slow respiratory effort, clear to auscultation bilaterally Chest wall: Tenderness over the sternum. CVS: Mild tachycardia, no murmur GI/Abd soft, nontender, nondistended, bowel sound present CNS: Alert, awake, oriented  x3 Psychiatry: Anxious look Extremities: No pedal edema, no calf tenderness     Consult Orders  (From admission, onward)           Start     Ordered   07/22/21 1635  Consult to hospitalist  Once       Provider:  (Not yet assigned)  Question Answer Comment  Place call to: [facility]   Reason for Consult Admit      07/22/21 1634            Labs on Admission:   CBC: Recent Labs  Lab 07/22/21 1051  WBC 14.3*  HGB 7.5*  HCT 23.0*  MCV 86.1  PLT 405*    Basic Metabolic Panel: Recent Labs  Lab 07/22/21 1051  NA 134*  K 4.8  CL 98  CO2 27  GLUCOSE 184*  BUN 25*  CREATININE 1.99*  CALCIUM 9.4    Liver Function Tests: No results for input(s): AST, ALT, ALKPHOS, BILITOT, PROT, ALBUMIN in the last 168 hours. No results for input(s): LIPASE, AMYLASE in the last 168 hours. No results for input(s): AMMONIA in the last 168 hours.  Cardiac Enzymes: No results for input(s): CKTOTAL, CKMB, CKMBINDEX, TROPONINI in the last 168 hours.  BNP (last 3 results) No results for input(s): BNP in the last 8760 hours.  ProBNP (last 3 results) No results for input(s): PROBNP in the last 8760 hours.  CBG: Recent Labs  Lab 07/22/21 1407  GLUCAP 130*    Lipase  No results found for: LIPASE   Urinalysis    Component Value Date/Time   COLORURINE YELLOW 05/19/2021 1447   APPEARANCEUR CLEAR 05/19/2021 1447   APPEARANCEUR Clear 05/05/2018 1211   LABSPEC 1.014 05/19/2021 1447   PHURINE 5.0 05/19/2021 1447   GLUCOSEU 50 (A) 05/19/2021 1447   HGBUR NEGATIVE 05/19/2021 1447   BILIRUBINUR NEGATIVE 05/19/2021 1447   BILIRUBINUR negative 09/12/2018 0919   BILIRUBINUR Negative 05/05/2018 1211   KETONESUR 5 (A) 05/19/2021 1447   PROTEINUR >=300 (A) 05/19/2021 1447   UROBILINOGEN 1.0 09/12/2018 0919   NITRITE NEGATIVE 05/19/2021 1447   LEUKOCYTESUR NEGATIVE 05/19/2021 1447     Drugs of Abuse     Component Value Date/Time   LABOPIA POSITIVE (A) 05/19/2021  1447   COCAINSCRNUR NONE DETECTED 05/19/2021 1447   LABBENZ NONE DETECTED 05/19/2021 1447   AMPHETMU NONE DETECTED 05/19/2021 1447   THCU POSITIVE (A) 05/19/2021  [place] 05/19/2021 1447      Radiological Exams on Admission: DG Chest 2 View  Result Date: 07/22/2021 CLINICAL DATA:  chest pain EXAM: CHEST - 2 VIEW COMPARISON:  None. FINDINGS: The cardiomediastinal silhouette is within normal limits. No pleural effusion. No pneumothorax. No lobar consolidation. Minimal left base subsegmental atelectasis. No acute osseous abnormality. IMPRESSION: Minimal left lung base subsegmental atelectasis. No other acute abnormality in the chest. Electronically Signed   By: Albin Felling M.D.   On: 07/22/2021 11:05   CT Chest Wo Contrast  Result Date: 07/22/2021 CLINICAL DATA:  Chest pain EXAM: CT CHEST WITHOUT CONTRAST TECHNIQUE: Multidetector CT imaging of the chest was performed following the standard protocol without IV contrast. COMPARISON:  None. FINDINGS: Cardiovascular: The heart is within normal limits in size. No pericardial effusion the aorta is normal in caliber. Minimal scattered atherosclerotic calcifications. Age advanced coronary artery calcifications. Mediastinum/Nodes: Small scattered mediastinal and hilar lymph nodes but no mass or overt adenopathy. Borderline enlarged subcarinal node measuring 12 mm could be related to the thoracic spine process. The esophagus is grossly normal. Lungs/Pleura: Areas of streaky bibasilar atelectasis. No focal airspace consolidation/pneumonia. No worrisome pulmonary lesions. No pleural effusions or pleural lesions. Upper Abdomen: No significant upper abdominal findings. Musculoskeletal: No chest wall mass, supraclavicular or axillary adenopathy. There are several small bilateral thyroid nodules. None of these measures greater than 12 mm. Not clinically significant; no follow-up imaging recommended (ref: J Am Coll Radiol. 2015 Feb;12(2): 143-50).  There are lytic destructive bone lesions involving the T5 and T6 vertebral bodies with a mild pathologic compression fracture of T6. Paraspinal hematoma or tumor noted at both these levels. No definite findings for spinal canal compromise. Mild lucency involving the sternum could also reflect lytic bone disease. I do not see any obvious rib lesions or pathologic rib fractures. IMPRESSION: 1. 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 There is a pathologic fracture of T6 with paraspinal hematoma or tumor. This could represent metastatic bone disease or possibly myeloma. 2. No obvious primary neoplasm involving the chest or upper abdomen. PET-CT may be helpful for further evaluation and extent of the bone disease and possibly detecting a primary neoplasm. 3. Thoracic spine MRI without and with contrast may be helpful for further evaluation of the thoracic disease. I do not see any any obvious spinal canal compromise. Aortic Atherosclerosis (ICD10-I70.0). Electronically Signed   By: Marijo Sanes M.D.   On: 07/22/2021 16:51   NM Pulmonary Perfusion  Result Date: 07/22/2021 CLINICAL DATA:  PE suspected, low/intermediate probability positive D-dimer. EXAM: NUCLEAR MEDICINE PERFUSION LUNG SCAN TECHNIQUE: Perfusion images were obtained in multiple projections after intravenous injection of radiopharmaceutical. Ventilation scans intentionally deferred if perfusion scan and chest x-ray adequate for interpretation during COVID 19 epidemic. RADIOPHARMACEUTICALS:  4.4 mCi Tc-31mMAA IV COMPARISON:  None. FINDINGS: Normal perfusion.  No focal defects. IMPRESSION: No evidence for PE. Electronically Signed   By: CSan MorelleM.D.   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 -CT scan chest showed lytic bone disease involving T5 and T6, possibly  sternum.  There is pathological fracture of T6 with paraspinal hematoma or tumor.  Findings suggestive of metastatic bone disease or possible myeloma. -Radiology recommended thoracic spine MRI with and without contrast.  Unable to use contrast at this time because of AKI. -I discussed the case with oncologist Dr. Burr Medico.  Recommended multiple myeloma panel, urine light chains and LDH level.  I ordered those for a.m. labs. -We will hold off on noncontrast MRI at this time.  If creatinine improves to normal, can consider contrast MRI.  AKI on CKD 3a -Baseline creatinine 1.3 from July 2022.  Patient with a creatinine elevated 1.99. -Start on normal saline at 75 mill per hour. -Continue to monitor. Recent Labs    05/18/21 1703 05/19/21 0431 05/20/21 0359 05/21/21 0406 07/22/21 1051  BUN 29* 37* 32* 35* 25*  CREATININE 2.35* 2.07* 1.34* 1.55* 1.99*   Type 2 diabetes mellitus -A1c 7.5 on August 2022 -Has long-term complications including retinopathy, neuropathy, microalbuminuria. -Home med list not updated by pharmacy yet.  Seems to include glimepiride, metformin, Trulicity. -Keep home meds on hold. -Start on sliding scale insulin with Accu-Cheks. -Continue Neurontin for neuropathy. Recent Labs  Lab 07/22/21 1407  GLUCAP 130*   Essential hypertension -Home meds seems to include amlodipine, losartan, verapamil, Lasix. -Resume verapamil.  Not sure if patient is taking both verapamil and amlodipine.  Keep losartan and Lasix on hold. -Continue to monitor blood pressure.  IV hydralazine as needed.  Hyperlipidemia -Lipitor 40 mg daily  Acute on  chronic anemia -Hemoglobin at baseline was more than 9 and July.  Presents today with hemoglobin low at 7.5.  Probably related to CKD and underlying malignancy. -No active blood loss.  Continue to monitor.  Transfuse for hemoglobin less than 7. Recent Labs    05/18/21 1703 05/19/21 0431 05/20/21 0359 07/22/21 1051  HGB 11.2* 9.5* 9.0* 7.5*  MCV 84.9 87.5 87.3 86.1  VITAMINB12  --   --  1,734*  --   FOLATE  --   --  43.8  --   FERRITIN 167  --  126  --   TIBC  --   --  250  --   IRON  --   --  59  --    Mobility: Encourage ambulation Code Status:   Code Status: Prior full code DVT prophylaxis: Heparin subcu Antimicrobials: None Fluid: Normal saline 75 mill per hour  Diet: Carb controlled diet  Consultants: Oncology Dr. Burr Medico Family Communication: Patient wife was not at bedside at the time of my evaluation.  Dispo: The patient is from: Home              Anticipated d/c is to: Home, pending clinical course  ------------------------------------------------------------------------------------- Severity of Illness: The appropriate patient status for this patient is INPATIENT. Inpatient status is judged to be reasonable and necessary in order to provide the required intensity of service to ensure the patient's safety. The patient's presenting symptoms, physical exam findings, and initial radiographic and laboratory data in the context of their chronic comorbidities is felt to place them at high risk for further clinical deterioration. Furthermore, it is not anticipated that the patient will be medically stable for discharge from the hospital within 2 midnights of admission. The following factors support the patient status of inpatient.   " The patient's presenting symptoms include chest pain, thoracic pain. " The worrisome physical exam findings include chest wall tenderness. " The initial radiographic and laboratory data are worrisome because of suspicion of malignancy. "  The chronic  co-morbidities include CKD, DM2, HTN.   * I certify that at the point of admission it is my clinical judgment that the patient will require inpatient hospital care spanning beyond 2 midnights from the point of admission due to high intensity of service, high risk for further deterioration and high frequency of surveillance required.*  Signed, Terrilee Croak, MD [facility] 07/22/2021

## 2021-07-22 NOTE — ED Notes (Signed)
Pt being transported to CT. Will be back in 15-20 mins.

## 2021-07-22 NOTE — ED Triage Notes (Signed)
Pt complains of chest pain radiating to lats and bilateral shoulder blades x 6 weeks. He went to PCP and chiropractor, had x-rays that shoed abnormal curve in spine. He has MRI scheduled in October.

## 2021-07-22 NOTE — ED Notes (Signed)
Attempted to stick patient with no success in the right AC. Look for vein with ultrasound. Pt did not have many. Attempted IV in left AC with success with blood return. Flushed with no pain.

## 2021-07-22 NOTE — ED Provider Notes (Signed)
[facility] DEPT Provider Note   CSN: [id number] Arrival date & time: 07/22/21  6203     History Chief Complaint  Patient presents with   Chest Pain    Jose [name] is a 45 y.o. male.  Patient presents with mid chest pain radiating to his upper back and bilateral posterior sides.  Symptoms been ongoing for 1 or 2 months per patient.  Describes them as persistent daily, worse when he bends over or reaches a certain way.  Describes no fevers no cough no vomiting or diarrhea no shortness of breath.  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      Past Medical History:  Diagnosis Date   GAD (generalized anxiety disorder)    Hyperlipidemia    Macular degeneration, bilateral    Retinopathy    Type II diabetes mellitus with complication, uncontrolled    retinopathy, neuropathy, microalbuminuria    Patient Active Problem List   Diagnosis Date Noted   Diabetic ulcer of left great toe ([place])    MSSA bacteremia 07/23/2021   Constipation 07/23/2021   Lytic bone lesions on xray 07/22/2021   AKI (acute kidney injury) ([place]) 05/19/2021   Anemia 05/19/2021   Nausea and vomiting 05/19/2021   Elevated blood pressure reading 05/19/2021   COVID-19 05/18/2021   Bilateral carpal tunnel syndrome 01/23/2020   Numbness 12/12/2019   Microalbuminuria due to type 2 diabetes mellitus ([place]) 07/20/2019   GAD (generalized anxiety disorder) 06/18/2019   Adult ADHD 06/18/2019   Trigger middle finger of left hand 06/11/2019   Diabetic retinopathy ([place]) 07/13/2018   Type 2 diabetes mellitus with diabetic polyneuropathy, without long-term current use of insulin ([place]) 05/17/2018   Tobacco use disorder 05/17/2018   Hyperlipidemia 05/17/2018    Past Surgical History:  Procedure Laterality Date   APPENDECTOMY     HERNIA REPAIR     IR FLUORO GUIDED  [place] ASPIRATION/INJECTION LOC  07/28/2021   TRIGGER FINGER RELEASE Right 10/25/2019   Procedure: RIGHT INDEX FINGER RELEASE TRIGGER FINGER/A-1 PULLEY;  Surgeon: Daryll Brod, MD;  Location: [facility];  Service: Orthopedics;  Laterality: Right;  IV REGIONAL FOREARM BLOCK       Family History  Problem Relation Age of Onset   Diabetes Mother    Hyperlipidemia Mother    Stroke Mother    Diabetes Father    Hyperlipidemia Brother    Stroke Brother    ADD / ADHD Brother    ADD / ADHD Son     Social History   Tobacco Use   Smoking status: Former    Packs/day: 1.00    Years: 20.00    Pack years: 20.00    Types: Cigarettes    Quit date: 07/2020    Years since quitting: 1.0   Smokeless tobacco: Never  Vaping Use   Vaping Use: Never used  Substance Use Topics   Alcohol use: Not Currently   Drug use: Never    Home Medications Prior to Admission medications   Medication Sig Start Date End Date Taking? Authorizing Provider  acetaminophen (TYLENOL) 500 MG tablet Take 1,000 mg by mouth every 6 (six) hours as needed for mild pain, fever or headache.   Yes [provider]  albuterol (VENTOLIN HFA) 108 (90 Base) MCG/ACT inhaler Inhale 1 puff into the lungs every 4 (four) hours as needed  for wheezing or shortness of breath. 05/27/21  Yes [provider]  ALPRAZolam (XANAX) 0.25 MG tablet Take 0.25 mg by mouth 2 (two) times daily as needed for anxiety. 06/12/21  Yes [provider]  Ascorbic Acid (VITAMIN C PO) Take 1 tablet by mouth daily.   Yes [provider]  atorvastatin (LIPITOR) 40 MG tablet Take 1 tablet (40 mg total) by mouth daily. 03/12/19  Yes Jacelyn Pi, Lilia Argue, MD  cyclobenzaprine (FLEXERIL) 10 MG tablet Take 10 mg by mouth 2 (two) times daily. 07/09/21  Yes [provider]  Dulaglutide (TRULICITY) 3 XB/2.6OM SOPN Inject 3 mg as directed once a week. 06/11/21  Yes Renato Shin, MD  gabapentin (NEURONTIN) 300 MG  capsule TAKE 1 TO 2 CAPSULES(300 TO 600 MG) BY MOUTH AT BEDTIME Patient taking differently: Take 600 mg by mouth at bedtime. 12/01/19  Yes Jacelyn Pi, Lilia Argue, MD  glimepiride (AMARYL) 1 MG tablet Take 1 tablet (1 mg total) by mouth daily with breakfast. 01/08/21  Yes Renato Shin, MD  HYDROcodone-acetaminophen (NORCO/VICODIN) 5-325 MG tablet Take 1 tablet by mouth 2 (two) times daily as needed for severe pain. 07/07/21  Yes [provider]  losartan (COZAAR) 50 MG tablet TAKE 1 TABLET(50 MG) BY MOUTH DAILY Patient taking differently: Take 50 mg by mouth 2 (two) times daily. 07/21/20  Yes Jacelyn Pi, Lilia Argue, MD  metFORMIN (GLUCOPHAGE-XR) 500 MG 24 hr tablet Take 1 tablet (500 mg total) by mouth daily. 06/11/21  Yes Renato Shin, MD  Multiple Vitamin (MULTIVITAMIN ADULT PO) Take 1 tablet by mouth daily.   Yes [provider]  oxyCODONE (OXY IR/ROXICODONE) 5 MG immediate release tablet Take 5 mg by mouth 2 (two) times daily as needed for pain or severe pain. 07/21/21  Yes [provider]  Ranibizumab (LUCENTIS) 0.3 MG/0.05ML SOLN 1 Dose by Intravitreal route every 3 (three) months.   Yes [provider]  tadalafil (CIALIS) 20 MG tablet TK 1 T PO  PO QD PRN FOR ERECTILE DYSFUNCTION Patient taking differently: Take 20 mg by mouth daily as needed for erectile dysfunction. 07/20/19  Yes Jacelyn Pi, Irma M, MD  tobramycin (TOBREX) 0.3 % ophthalmic solution Place 1 drop into both eyes See admin instructions. Begin 1 day prior to treatment and continue the day of treatment and for one full day after treatment. 07/05/18  Yes [provider]  traZODone (DESYREL) 100 MG tablet TAKE 1 TABLET(100 MG) BY MOUTH AT BEDTIME AS NEEDED FOR SLEEP Patient taking differently: Take 100 mg by mouth at bedtime. 03/05/21  Yes Ravi, Himabindu, MD  VITAMIN D PO Take 1 tablet by mouth daily.   Yes [provider]  amLODipine (NORVASC) 5 MG tablet Take 5 mg by mouth daily.  03/18/21   [provider]  amphetamine-dextroamphetamine (ADDERALL) 20 MG tablet Take 1 tablet (20 mg total) by mouth 2 (two) times daily. 03/05/21 07/04/21  Elvin So, MD  DULoxetine (CYMBALTA) 60 MG capsule TAKE 1 CAPSULE(60 MG) BY MOUTH DAILY Patient not taking: Reported on 07/22/2021 03/05/21   Elvin So, MD  furosemide (LASIX) 20 MG tablet Take 20 mg by mouth daily. 04/30/21   [provider]  verapamil (CALAN-SR) 120 MG CR tablet Take 120 mg by mouth daily. 04/17/21   [provider]    Allergies    Patient has no known allergies.  Review of Systems   Review of Systems  Constitutional:  Negative for fever.  HENT:  Negative for ear pain  and sore throat.   Eyes:  Negative for pain.  Respiratory:  Negative for cough.   Cardiovascular:  Positive for chest pain.  Gastrointestinal:  Negative for abdominal pain.  Genitourinary:  Negative for flank pain.  Musculoskeletal:  Negative for back pain.  Skin:  Negative for color change and rash.  Neurological:  Negative for syncope.  All other systems reviewed and are negative.  Physical Exam Updated Vital Signs BP (!) 152/81 (BP Location: Right Arm)   Pulse (!) 110   Temp 98.2 F (36.8 C) (Oral)   Resp 16   Ht 5' 11"  (1.803 m)   Wt 86.2 kg   SpO2 93%   BMI 26.50 kg/m   Physical Exam Constitutional:      Appearance: He is well-developed.  HENT:     Head: Normocephalic.     Nose: Nose normal.  Eyes:     Extraocular Movements: Extraocular movements intact.  Cardiovascular:     Rate and Rhythm: Normal rate.  Pulmonary:     Effort: Pulmonary effort is normal.  Skin:    Coloration: Skin is not jaundiced.  Neurological:     General: No focal deficit present.     Mental Status: He is alert and oriented to person, place, and time. Mental status is at baseline.     Cranial Nerves: No cranial nerve deficit.     Motor: No weakness.    ED Results / Procedures / Treatments   Labs (all labs  ordered are listed, but only abnormal results are displayed) Labs Reviewed  CULTURE, BLOOD (ROUTINE X 2) - Abnormal; Notable for the following components:      Result Value   Culture STAPHYLOCOCCUS AUREUS (*)    All other components within normal limits  BLOOD CULTURE ID PANEL (REFLEXED) - BCID2 - Abnormal; Notable for the following components:   Staphylococcus species DETECTED (*)    Staphylococcus aureus (BCID) DETECTED (*)    All other components within normal limits  BASIC METABOLIC PANEL - Abnormal; Notable for the following components:   Sodium 134 (*)    Glucose, Bld 184 (*)    BUN 25 (*)    Creatinine, Ser 1.99 (*)    GFR, Estimated 41 (*)    All other components within normal limits  CBC - Abnormal; Notable for the following components:   WBC 14.3 (*)    RBC 2.67 (*)    Hemoglobin 7.5 (*)    HCT 23.0 (*)    Platelets 405 (*)    All other components within normal limits  D-DIMER, QUANTITATIVE - Abnormal; Notable for the following components:   D-Dimer, Quant 1.10 (*)    All other components within normal limits  URINALYSIS, ROUTINE W REFLEX MICROSCOPIC - Abnormal; Notable for the following components:   Color, Urine STRAW (*)    Hgb urine dipstick TRACE (*)    Protein, ur 100 (*)    All other components within normal limits  MULTIPLE MYELOMA PANEL, SERUM - Abnormal; Notable for the following components:   Albumin SerPl Elph-Mcnc 2.7 (*)    Alpha2 Glob SerPl Elph-Mcnc 1.1 (*)    All other components within normal limits  UPEP/UIFE/LIGHT CHAINS/TP, 24-HR UR - Abnormal; Notable for the following components:   Total Protein, Urine-Ur/day 2,286 (*)    Free Kappa Lt Chains,Ur 278.99 (*)    Free Lambda Lt Chains,Ur 59.16 (*)    All other components within normal limits  LACTATE DEHYDROGENASE - Abnormal; Notable for the following components:  LDH 77 (*)    All other components within normal limits  BASIC METABOLIC PANEL - Abnormal; Notable for the following components:    Glucose, Bld 144 (*)    BUN 22 (*)    Creatinine, Ser 1.38 (*)    All other components within normal limits  CBC - Abnormal; Notable for the following components:   WBC 14.4 (*)    RBC 3.20 (*)    Hemoglobin 8.6 (*)    HCT 26.2 (*)    All other components within normal limits  KAPPA/LAMBDA LIGHT CHAINS - Abnormal; Notable for the following components:   Kappa free light chain 71.2 (*)    Lambda free light chains 34.0 (*)    Kappa, lambda light chain ratio 2.09 (*)    All other components within normal limits  RETICULOCYTES - Abnormal; Notable for the following components:   RBC. 3.15 (*)    All other components within normal limits  GLUCOSE, CAPILLARY - Abnormal; Notable for the following components:   Glucose-Capillary 155 (*)    All other components within normal limits  GLUCOSE, CAPILLARY - Abnormal; Notable for the following components:   Glucose-Capillary 124 (*)    All other components within normal limits  IRON AND TIBC - Abnormal; Notable for the following components:   Iron 21 (*)    TIBC 223 (*)    Saturation Ratios 9 (*)    All other components within normal limits  GLUCOSE, CAPILLARY - Abnormal; Notable for the following components:   Glucose-Capillary 258 (*)    All other components within normal limits  GLUCOSE, CAPILLARY - Abnormal; Notable for the following components:   Glucose-Capillary 148 (*)    All other components within normal limits  BASIC METABOLIC PANEL - Abnormal; Notable for the following components:   Sodium 131 (*)    Glucose, Bld 133 (*)    BUN 26 (*)    Creatinine, Ser 2.15 (*)    Calcium 8.4 (*)    GFR, Estimated 37 (*)    All other components within normal limits  CBC WITH DIFFERENTIAL/PLATELET - Abnormal; Notable for the following components:   WBC 13.0 (*)    RBC 2.62 (*)    Hemoglobin 7.2 (*)    HCT 22.1 (*)    Neutro Abs 9.2 (*)    Monocytes Absolute 1.8 (*)    All other components within normal limits  GLUCOSE, CAPILLARY -  Abnormal; Notable for the following components:   Glucose-Capillary 149 (*)    All other components within normal limits  HEMOGLOBIN AND HEMATOCRIT, BLOOD - Abnormal; Notable for the following components:   Hemoglobin 7.7 (*)    HCT 23.0 (*)    All other components within normal limits  GLUCOSE, CAPILLARY - Abnormal; Notable for the following components:   Glucose-Capillary 133 (*)    All other components within normal limits  GLUCOSE, CAPILLARY - Abnormal; Notable for the following components:   Glucose-Capillary 123 (*)    All other components within normal limits  BASIC METABOLIC PANEL - Abnormal; Notable for the following components:   Glucose, Bld 165 (*)    BUN 26 (*)    Creatinine, Ser 2.21 (*)    GFR, Estimated 36 (*)    All other components within normal limits  CBC WITH DIFFERENTIAL/PLATELET - Abnormal; Notable for the following components:   WBC 12.9 (*)    RBC 2.53 (*)    Hemoglobin 7.1 (*)    HCT 21.5 (*)  Neutro Abs 9.4 (*)    Monocytes Absolute 1.8 (*)    All other components within normal limits  GLUCOSE, CAPILLARY - Abnormal; Notable for the following components:   Glucose-Capillary 208 (*)    All other components within normal limits  GLUCOSE, CAPILLARY - Abnormal; Notable for the following components:   Glucose-Capillary 189 (*)    All other components within normal limits  GLUCOSE, CAPILLARY - Abnormal; Notable for the following components:   Glucose-Capillary 147 (*)    All other components within normal limits  GLUCOSE, CAPILLARY - Abnormal; Notable for the following components:   Glucose-Capillary 168 (*)    All other components within normal limits  RENAL FUNCTION PANEL - Abnormal; Notable for the following components:   Glucose, Bld 222 (*)    BUN 25 (*)    Creatinine, Ser 2.02 (*)    Albumin 2.4 (*)    GFR, Estimated 40 (*)    All other components within normal limits  CBC WITH DIFFERENTIAL/PLATELET - Abnormal; Notable for the following  components:   WBC 13.7 (*)    RBC 2.84 (*)    Hemoglobin 8.1 (*)    HCT 24.1 (*)    Neutro Abs 10.4 (*)    Monocytes Absolute 2.0 (*)    Abs Immature Granulocytes 0.08 (*)    All other components within normal limits  GLUCOSE, CAPILLARY - Abnormal; Notable for the following components:   Glucose-Capillary 161 (*)    All other components within normal limits  HEMOGLOBIN AND HEMATOCRIT, BLOOD - Abnormal; Notable for the following components:   Hemoglobin 8.6 (*)    HCT 26.0 (*)    All other components within normal limits  GLUCOSE, CAPILLARY - Abnormal; Notable for the following components:   Glucose-Capillary 156 (*)    All other components within normal limits  GLUCOSE, CAPILLARY - Abnormal; Notable for the following components:   Glucose-Capillary 207 (*)    All other components within normal limits  GLUCOSE, CAPILLARY - Abnormal; Notable for the following components:   Glucose-Capillary 169 (*)    All other components within normal limits  CBC WITH DIFFERENTIAL/PLATELET - Abnormal; Notable for the following components:   WBC 12.8 (*)    RBC 2.77 (*)    Hemoglobin 7.8 (*)    HCT 23.5 (*)    Neutro Abs 9.6 (*)    Monocytes Absolute 1.5 (*)    Abs Immature Granulocytes 0.08 (*)    All other components within normal limits  BASIC METABOLIC PANEL - Abnormal; Notable for the following components:   CO2 20 (*)    Glucose, Bld 158 (*)    BUN 27 (*)    Creatinine, Ser 2.09 (*)    GFR, Estimated 39 (*)    All other components within normal limits  GLUCOSE, CAPILLARY - Abnormal; Notable for the following components:   Glucose-Capillary 136 (*)    All other components within normal limits  GLUCOSE, CAPILLARY - Abnormal; Notable for the following components:   Glucose-Capillary 137 (*)    All other components within normal limits  GLUCOSE, CAPILLARY - Abnormal; Notable for the following components:   Glucose-Capillary 154 (*)    All other components within normal limits   GLUCOSE, CAPILLARY - Abnormal; Notable for the following components:   Glucose-Capillary 200 (*)    All other components within normal limits  PROTIME-INR - Abnormal; Notable for the following components:   Prothrombin Time 15.6 (*)    All other components within normal limits  GLUCOSE, CAPILLARY - Abnormal; Notable for the following components:   Glucose-Capillary 157 (*)    All other components within normal limits  CBC WITH DIFFERENTIAL/PLATELET - Abnormal; Notable for the following components:   WBC 12.5 (*)    RBC 3.00 (*)    Hemoglobin 8.4 (*)    HCT 25.2 (*)    Neutro Abs 9.3 (*)    Monocytes Absolute 1.5 (*)    All other components within normal limits  BASIC METABOLIC PANEL - Abnormal; Notable for the following components:   Glucose, Bld 134 (*)    BUN 26 (*)    Creatinine, Ser 1.93 (*)    GFR, Estimated 42 (*)    All other components within normal limits  GLUCOSE, CAPILLARY - Abnormal; Notable for the following components:   Glucose-Capillary 135 (*)    All other components within normal limits  GLUCOSE, CAPILLARY - Abnormal; Notable for the following components:   Glucose-Capillary 165 (*)    All other components within normal limits  GLUCOSE, CAPILLARY - Abnormal; Notable for the following components:   Glucose-Capillary 156 (*)    All other components within normal limits  RENAL FUNCTION PANEL - Abnormal; Notable for the following components:   Glucose, Bld 138 (*)    BUN 28 (*)    Creatinine, Ser 1.92 (*)    Phosphorus 5.4 (*)    Albumin 2.3 (*)    GFR, Estimated 43 (*)    All other components within normal limits  GLUCOSE, CAPILLARY - Abnormal; Notable for the following components:   Glucose-Capillary 155 (*)    All other components within normal limits  GLUCOSE, CAPILLARY - Abnormal; Notable for the following components:   Glucose-Capillary 180 (*)    All other components within normal limits  GLUCOSE, CAPILLARY - Abnormal; Notable for the following  components:   Glucose-Capillary 166 (*)    All other components within normal limits  GLUCOSE, CAPILLARY - Abnormal; Notable for the following components:   Glucose-Capillary 130 (*)    All other components within normal limits  GLUCOSE, CAPILLARY - Abnormal; Notable for the following components:   Glucose-Capillary 122 (*)    All other components within normal limits  CBG MONITORING, ED - Abnormal; Notable for the following components:   Glucose-Capillary 130 (*)    All other components within normal limits  CULTURE, BLOOD (ROUTINE X 2)  SARS CORONAVIRUS 2 (TAT 6-24 HRS)  CULTURE, BLOOD (ROUTINE X 2)  CULTURE, BLOOD (ROUTINE X 2)  LACTIC ACID, PLASMA  PSA  VITAMIN B12  FOLATE  FERRITIN  MAGNESIUM  OCCULT BLOOD X 1 CARD TO LAB, STOOL  CBC WITH DIFFERENTIAL/PLATELET  POC OCCULT BLOOD, ED  TYPE AND SCREEN  ABO/RH  PREPARE RBC (CROSSMATCH)  PREPARE RBC (CROSSMATCH)  TROPONIN I (HIGH SENSITIVITY)  TROPONIN I (HIGH SENSITIVITY)    EKG EKG Interpretation  Date/Time:  Wednesday July 22 2021 10:17:30 EDT Ventricular Rate:  97 PR Interval:  163 QRS Duration: 97 QT Interval:  334 QTC Calculation: 425 R Axis:   67 Text Interpretation: Sinus rhythm Probable left atrial enlargement Confirmed by Thamas Jaegers ([id number]) on 07/22/2021 11:17:13 AM  Radiology CT HEAD WO CONTRAST (5MM)  Result Date: 07/27/2021 CLINICAL DATA:  Dizziness EXAM: CT HEAD WITHOUT CONTRAST TECHNIQUE: Contiguous axial images were obtained from the base of the skull through the vertex without intravenous contrast. COMPARISON:  None. FINDINGS: Brain: No evidence of acute infarction, hemorrhage, hydrocephalus, extra-axial collection or mass lesion/mass effect. Vascular: No hyperdense vessel or  unexpected calcification. Skull: Normal. Negative for fracture or focal lesion. Sinuses/Orbits: No acute finding. Other: None. IMPRESSION: No acute intracranial abnormality noted. Electronically Signed   By: Inez Catalina M.D.    On: 07/27/2021 20:55   IR Fluoro Guide Ndl Plmt / BX  Result Date: 07/28/2021 INDICATION: Jose [name] is a 45 y.o. male with PMH of DM2, retinopathy, neuropathy, microalbuminuria, HTN, HLD, CKD 3, COVID-19 infection July 2022. Patient presented to ED 07/22/2021 complaining of chest pain that radiated to both shoulder blades for approximately 6 weeks. Chest CT with contrast was significant for lytic bone disease involving T4 and T5 and possibly the sternum with a superior endplate fracture at T5 concerning for malignancy versus osteomyelitis in the setting of MSSA bacteremia. She comes to our service today for a T5 fluoroscopy guided or bone biopsy. EXAM: FLUOROSCOPY GUIDED T8 VERTEBRAL BODY CORE BONE BIOPSY MEDICATIONS: None. ANESTHESIA/SEDATION: Moderate (conscious) sedation was employed during this procedure. A total of Versed 1.5 mg and Fentanyl 100 mcg was administered intravenously. Moderate Sedation Time: 12 minutes. The patient's level of consciousness and vital signs were monitored continuously by radiology nursing throughout the procedure under my direct supervision. FLUOROSCOPY TIME:  Fluoroscopy Time: 7 minutes 18 seconds; DAP (430 mGy). COMPLICATIONS: None immediate. PROCEDURE: Informed written consent was obtained from the patient after a thorough discussion of the procedural risks, benefits and alternatives. All questions were addressed. Maximal Sterile Barrier Technique was utilized including caps, mask, sterile gowns, sterile gloves, sterile drape, hand hygiene and skin antiseptic. A timeout was performed prior to the initiation of the procedure. The patient was placed in prone position on the angiography table. The thoracic spine region was prepped and draped in a sterile fashion. Under fluoroscopy, the T5 vertebral body was delineated and the skin area was marked. The skin was infiltrated with a 1% Lidocaine approximately 3 cm lateral to the spinous process projection on the left. Using a  22-gauge spinal needle, the soft issue and the peripedicular space and periosteum were infiltrated with Bupivacaine 0.5%. A skin incision was made at the access site. Subsequently, an 11-gauge Kyphon trocar was inserted under fluoroscopic guidance until contact with the pedicle was obtained. The trocar was inserted under light hammer tapping into the pedicle until the posterior boundaries of the vertebral body was reached. The diamond mandrill was removed and 2 core biopsies wereobtained. The trocar was later removed. IMPRESSION: Successful fluoroscopy-guided left transpedicular approach for T5 vertebral body core bone biopsy . Bone samples obtained were sent for tissue exam. Electronically Signed   By: Pedro Earls M.D.   On: 07/28/2021 12:13   ECHO TEE  Result Date: 07/29/2021    TRANSESOPHOGEAL ECHO REPORT   Patient Name:   Jose [name] Date of Exam: 07/29/2021 Medical Rec #:  [id number]       Height:       71.0 in Accession #:    [id number]      Weight:       190.0 lb Date of Birth:  04/16/1976       BSA:          2.063 m Patient Age:    67 years        BP:           148/71 mmHg Patient Gender: M               HR:           103 bpm. Exam Location:  Inpatient  Procedure: Transesophageal Echo, Color Doppler and Saline Contrast Bubble Study Indications:    Bacteremia  History:        Patient has prior history of Echocardiogram examinations, most                 recent 07/24/2021.  Sonographer:    Philipp Deputy RDCS Referring Phys: [id number] Margie Billet PROCEDURE: After discussion of the risks and benefits of a TEE, an informed consent was obtained from the patient. The transesophogeal probe was passed without difficulty through the esophogus of the patient. Imaged were obtained with the patient in a left lateral decubitus position. Sedation performed by different physician. The patient was monitored while under deep sedation. Anesthestetic sedation was provided intravenously by Anesthesiology:  234m of Propofol. Image quality was adequate. The patient's vital signs; including heart rate, blood pressure, and oxygen saturation; remained stable throughout the procedure. The patient developed no complications during the procedure. IMPRESSIONS  1. Left ventricular ejection fraction, by estimation, is 60 to 65%. The left ventricle has normal function. The left ventricle has no regional wall motion abnormalities.  2. Right ventricular systolic function is normal. The right ventricular size is normal.  3. No left atrial/left atrial appendage thrombus was detected.  4. The mitral valve is normal in structure. Trivial mitral valve regurgitation. No evidence of mitral stenosis.  5. The aortic valve is normal in structure. Aortic valve regurgitation is not visualized. No aortic stenosis is present.  6. The inferior vena cava is normal in size with greater than 50% respiratory variability, suggesting right atrial pressure of 3 mmHg.  7. Evidence of atrial level shunting detected by color flow Doppler. Agitated saline contrast bubble study was positive with shunting observed within 3-6 cardiac cycles suggestive of interatrial shunt. There is a small patent foramen ovale with bidirectional shunting across atrial septum. 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 FINDINGS  Left Ventricle: Left ventricular ejection fraction, by estimation, is 60 to 65%. The left ventricle has normal function. The left ventricle has no regional wall motion abnormalities. The left ventricular internal cavity size was normal in size. There is  no left ventricular hypertrophy. Right Ventricle: The right ventricular size is normal. No increase in right ventricular wall thickness. Right ventricular systolic function is normal. Left Atrium: Left atrial size was normal in size. No left atrial/left atrial appendage thrombus was detected. Right Atrium: Right atrial size  was normal in size. Pericardium: There is no evidence of pericardial effusion. Mitral Valve: The mitral valve is normal in structure. Trivial mitral valve regurgitation. No evidence of mitral valve stenosis. Tricuspid Valve: The tricuspid valve is normal in structure. Tricuspid valve regurgitation is not demonstrated. No evidence of tricuspid stenosis. Aortic Valve: The aortic valve is normal in structure. Aortic valve regurgitation is not visualized. No aortic stenosis is present. Pulmonic Valve: The pulmonic valve was normal in structure. Pulmonic valve regurgitation is not visualized. No evidence of pulmonic stenosis. Aorta: The aortic root is normal in size and structure. Venous: The inferior vena cava is normal in size with greater than 50% respiratory variability, suggesting right atrial pressure of 3 mmHg. IAS/Shunts: Evidence of atrial level shunting detected by color flow Doppler. Agitated saline contrast was given intravenously to evaluate for intracardiac shunting. Agitated saline contrast bubble study was positive with shunting observed within 3-6 cardiac cycles suggestive of interatrial shunt. A small patent foramen ovale is detected with bidirectional shunting across atrial septum. MCandee FurbishMD Electronically signed by MCandee Furbish  MD Signature Date/Time: 07/29/2021/2:53:55 PM    Final     Procedures Procedures   Medications Ordered in ED Medications  atorvastatin (LIPITOR) tablet 40 mg ( Oral MAR [facility] 07/29/21 1355)  verapamil (CALAN-SR) CR tablet 120 mg ( Oral MAR [facility] 07/29/21 1355)  ALPRAZolam (XANAX) tablet 0.25 mg ( Oral MAR [facility] 07/29/21 1355)  cyclobenzaprine (FLEXERIL) tablet 10 mg ( Oral MAR [facility] 07/29/21 1355)  hydrALAZINE (APRESOLINE) injection 10 mg ( Intravenous MAR [facility] 07/29/21 1355)  acetaminophen (TYLENOL) tablet 1,000 mg ( Oral MAR [facility] 07/29/21 1355)  albuterol (PROVENTIL) (2.5 MG/3ML) 0.083% nebulizer solution 3 mL ( Inhalation MAR [facility] 07/29/21 1355)   traZODone (DESYREL) tablet 100 mg ( Oral MAR [facility] 07/29/21 1355)  senna-docusate (Senokot-S) tablet 1 tablet ( Oral MAR [facility]hold 07/29/21 1355)  oxyCODONE (Oxy IR/ROXICODONE) immediate release tablet 5 mg (5 mg Oral Given 07/29/21 1702)  feeding supplement (ENSURE ENLIVE / ENSURE PLUS) liquid 237 mL (237 mLs Oral Not Given 07/29/21 1702)  multivitamin with minerals tablet 1 tablet ( Oral MAR [facility] 07/29/21 1355)  ceFAZolin (ANCEF) IVPB 2g/100 mL premix (2 g Intravenous New Bag/Given 07/29/21 1512)  gabapentin (NEURONTIN) capsule 200 mg (200 mg Oral Given 07/29/21 1702)  bisacodyl (DULCOLAX) suppository 10 mg (10 mg Rectal Given 07/27/21 2302)  lactulose (CHRONULAC) 10 GM/15ML solution 20 g ( Oral MAR [facility] 07/29/21 1355)  mupirocin ointment (BACTROBAN) 2 % 1 application ( Topical MAR [facility] 07/29/21 1355)  hydrALAZINE (APRESOLINE) tablet 50 mg (50 mg Oral Given 07/29/21 1513)  chlorproMAZINE (THORAZINE) 25 mg in sodium chloride 0.9 % 25 mL IVPB ( Intravenous MAR [facility] 07/29/21 1355)  HYDROmorphone (DILAUDID) injection 1 mg (1 mg Intravenous Given 07/29/21 1505)  insulin aspart (novoLOG) injection 0-9 Units (1 Units Subcutaneous Given 07/29/21 1514)  collagenase (SANTYL) ointment ( Topical MAR [facility] 07/29/21 1355)  heparin injection 5,000 Units (5,000 Units Subcutaneous Given 07/29/21 1514)  amLODipine (NORVASC) tablet 10 mg ( Oral MAR [facility] 07/29/21 1355)  fentaNYL (DURAGESIC) 25 MCG/HR 1 patch ( Transdermal MAR [facility] 07/29/21 1355)  sodium chloride 0.9 % bolus 1,000 mL (0 mLs Intravenous Stopped 07/22/21 1730)  technetium albumin aggregated (MAA) injection solution 4.4 millicurie (4.4 millicuries Intravenous Contrast Given 07/22/21 1543)  cyanocobalamin ((VITAMIN B-12)) injection 1,000 mcg (1,000 mcg Subcutaneous Given 07/29/21 0939)  iohexol (OMNIPAQUE) 9 MG/ML oral solution 500 mL (500 mLs Oral Contrast Given 07/23/21 1917)  gadobutrol (GADAVIST) 1 MMOL/ML injection 10 mL (10 mLs Intravenous Contrast  Given 07/23/21 1819)  0.9 %  sodium chloride infusion (Manually program via Guardrails IV Fluids) (0 mLs Intravenous Stopped 07/25/21 1434)  furosemide (LASIX) injection 20 mg (20 mg Intravenous Given 07/25/21 1612)  acetaminophen (TYLENOL) tablet 650 mg (650 mg Oral Given 07/25/21 1153)  diphenhydrAMINE (BENADRYL) capsule 25 mg (25 mg Oral Given 07/25/21 1154)  docusate sodium (ENEMEEZ) enema 283 mg (283 mg Rectal Given 07/26/21 2229)  furosemide (LASIX) injection 40 mg (40 mg Intravenous Given 07/27/21 1155)  furosemide (LASIX) injection 40 mg (40 mg Intravenous Given 07/27/21 1815)  furosemide (LASIX) injection 40 mg (40 mg Intravenous Given 07/28/21 2148)    ED Course  I have reviewed the triage vital signs and the nursing notes.  Pertinent labs & imaging results that were available during my care of the patient were reviewed by me and considered in my medical decision making (see chart for details).    MDM Rules/Calculators/A&P  Abnormal bony lesion seen on imaging.  Lytic lesion seen on the thoracic spine on CT.  Concerning for malignancy.  Admitted to the hospitalist team.  Final Clinical Impression(s) / ED Diagnoses Final diagnoses:  Lytic bone lesions on xray  SOB (shortness of breath)    Rx / DC Orders ED Discharge Orders     None        Luna Fuse, MD 07/29/21 1710

## 2021-07-22 NOTE — ED Notes (Signed)
Pt is aware that urine sample is needed, urinal given.

## 2021-07-22 NOTE — ED Notes (Signed)
Returned from [employer] No signs of distress. Provider in to speak to patient.

## 2021-07-23 ENCOUNTER — Encounter (HOSPITAL_COMMUNITY): Payer: Self-pay | Admitting: Internal Medicine

## 2021-07-23 ENCOUNTER — Inpatient Hospital Stay (HOSPITAL_COMMUNITY): Payer: 59

## 2021-07-23 DIAGNOSIS — E785 Hyperlipidemia, unspecified: Secondary | ICD-10-CM

## 2021-07-23 DIAGNOSIS — R7881 Bacteremia: Secondary | ICD-10-CM | POA: Diagnosis present

## 2021-07-23 DIAGNOSIS — K5900 Constipation, unspecified: Secondary | ICD-10-CM | POA: Diagnosis not present

## 2021-07-23 DIAGNOSIS — B9561 Methicillin susceptible Staphylococcus aureus infection as the cause of diseases classified elsewhere: Secondary | ICD-10-CM | POA: Diagnosis present

## 2021-07-23 DIAGNOSIS — N179 Acute kidney failure, unspecified: Secondary | ICD-10-CM

## 2021-07-23 DIAGNOSIS — D649 Anemia, unspecified: Secondary | ICD-10-CM

## 2021-07-23 DIAGNOSIS — M899 Disorder of bone, unspecified: Secondary | ICD-10-CM | POA: Diagnosis not present

## 2021-07-23 LAB — BLOOD CULTURE ID PANEL (REFLEXED) - BCID2

## 2021-07-23 LAB — GLUCOSE, CAPILLARY
Glucose-Capillary: 124 mg/dL — ABNORMAL HIGH (ref 70–99)
Glucose-Capillary: 258 mg/dL — ABNORMAL HIGH (ref 70–99)
Glucose-Capillary: 148 mg/dL — ABNORMAL HIGH (ref 70–99)
Glucose-Capillary: 149 mg/dL — ABNORMAL HIGH (ref 70–99)

## 2021-07-23 LAB — CBC
HCT: 26.2 % — ABNORMAL LOW (ref 39.0–52.0)
Hemoglobin: 8.6 g/dL — ABNORMAL LOW (ref 13.0–17.0)
MCH: 26.9 pg (ref 26.0–34.0)
MCHC: 32.8 g/dL (ref 30.0–36.0)
MCV: 81.9 fL (ref 80.0–100.0)
Platelets: 371 10*3/uL (ref 150–400)
RBC: 3.2 MIL/uL — ABNORMAL LOW (ref 4.22–5.81)
RDW: 12.9 % (ref 11.5–15.5)
WBC: 14.4 10*3/uL — ABNORMAL HIGH (ref 4.0–10.5)
nRBC: 0 % (ref 0.0–0.2)

## 2021-07-23 LAB — IRON AND TIBC
Iron: 21 ug/dL — ABNORMAL LOW (ref 45–182)
Saturation Ratios: 9 % — ABNORMAL LOW (ref 17.9–39.5)
TIBC: 223 ug/dL — ABNORMAL LOW (ref 250–450)
UIBC: 202 ug/dL

## 2021-07-23 LAB — BASIC METABOLIC PANEL
Anion gap: 7 (ref 5–15)
BUN: 22 mg/dL — ABNORMAL HIGH (ref 6–20)
CO2: 25 mmol/L (ref 22–32)
Calcium: 9.2 mg/dL (ref 8.9–10.3)
Chloride: 103 mmol/L (ref 98–111)
Creatinine, Ser: 1.38 mg/dL — ABNORMAL HIGH (ref 0.61–1.24)
GFR, Estimated: >60 mL/min (ref >60)
Glucose, Bld: 144 mg/dL — ABNORMAL HIGH (ref 70–99)
Potassium: 4.3 mmol/L (ref 3.5–5.1)
Sodium: 135 mmol/L (ref 135–145)

## 2021-07-23 LAB — SARS CORONAVIRUS 2 (TAT 6-24 HRS): SARS Coronavirus 2: NEGATIVE

## 2021-07-23 LAB — FOLATE: Folate: 13.3 ng/mL (ref >5.9)

## 2021-07-23 LAB — LACTATE DEHYDROGENASE: LDH: 77 U/L — ABNORMAL LOW (ref 98–192)

## 2021-07-23 LAB — VITAMIN B12: Vitamin B-12: 252 pg/mL (ref 180–914)

## 2021-07-23 LAB — RETICULOCYTES
Immature Retic Fract: 10.5 % (ref 2.3–15.9)
RBC.: 3.15 MIL/uL — ABNORMAL LOW (ref 4.22–5.81)
Retic Count, Absolute: 35.3 10*3/uL (ref 19.0–186.0)
Retic Ct Pct: 1.1 % (ref 0.4–3.1)

## 2021-07-23 LAB — PSA: Prostatic Specific Antigen: 0.35 ng/mL (ref 0.00–4.00)

## 2021-07-23 LAB — FERRITIN: Ferritin: 193 ng/mL (ref 24–336)

## 2021-07-23 IMAGING — MR MR LUMBAR SPINE WO/W CM
6 of 7 series · 31 of 48 positions shown · IV contrast (GADAVIST)
Comparison: None.

CLINICAL DATA: Bone lesion, T-spine, malignancy suspected; Bone
lesion, L/S-spine, malignancy suspected

EXAM:
MRI THORACIC AND LUMBAR SPINE WITHOUT AND WITH CONTRAST
TECHNIQUE: Multiplanar and multiecho pulse sequences of the thoracic and lumbar
spine were obtained without and with intravenous contrast.
CONTRAST:  10mL GADAVIST GADOBUTROL 1 MMOL/ML IV SOLN

[Series 1: T1 · sagittal · 4.0mm · 0.81mm/px · 5 of 17 slices shown (1 of 2)]
[im 1/17]
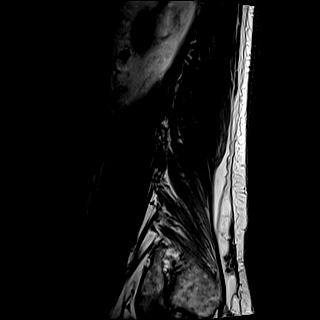
[im 5/17]
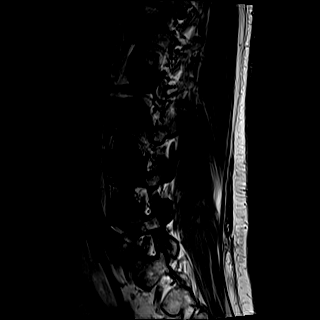
[im 9/17]
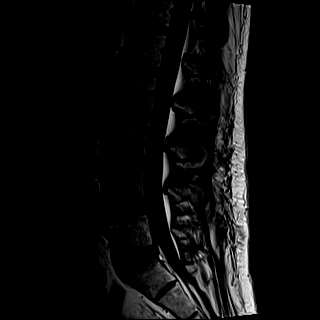
[im 13/17]
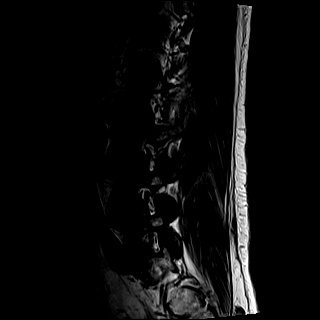
[im 17/17]
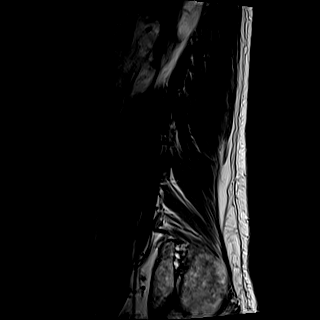

[Series 2: STIR · sagittal · 4.0mm · 0.51mm/px · 2 of 17 slices shown]
[im 1/17]
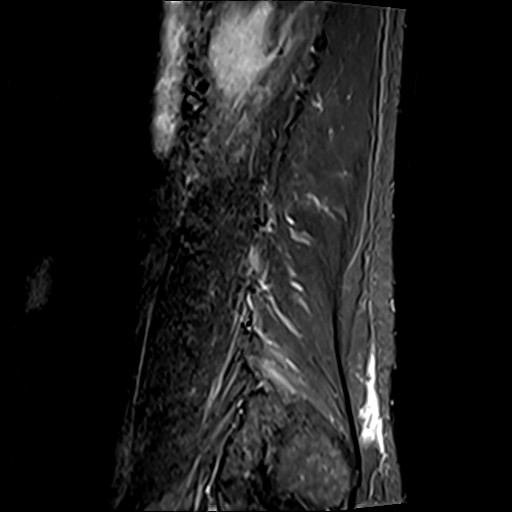
[im 5/17]
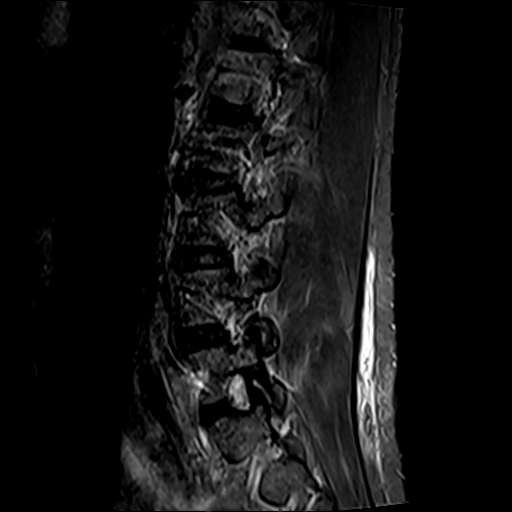

[Series 3: T2 · sagittal · 4.0mm · 0.81mm/px · 4 of 17 slices shown (1 of 2)]
[im 1/17]
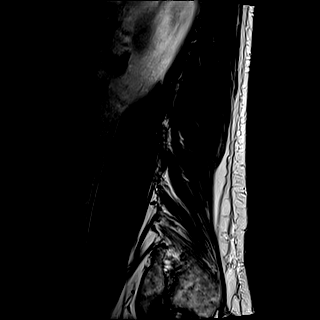
[im 6/17]
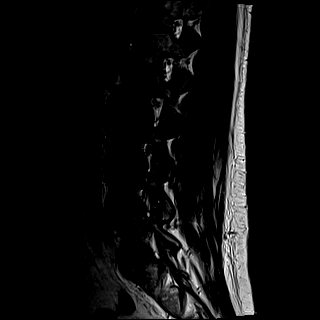
[im 11/17]
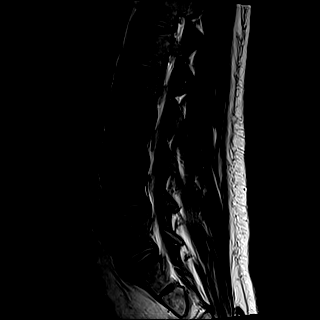
[im 17/17]
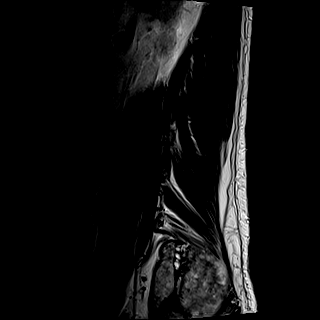

[Series 4: T2 · axial · 4.0mm · 0.62mm/px · z∈[-518,-285]mm · 8 of 43 slices shown (2 of 2)]
[im 1/43]
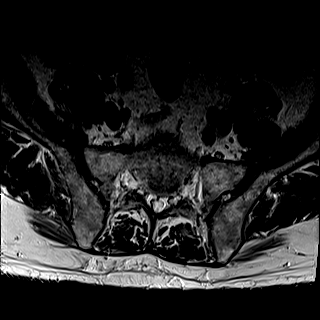
[im 5/43]
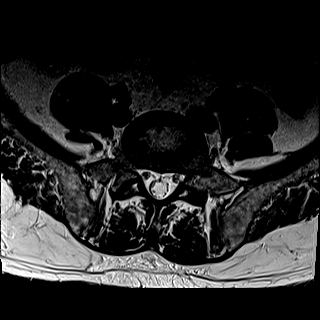
[im 15/43]
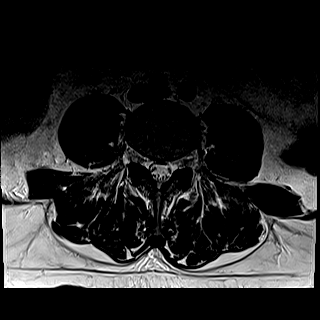
[im 19/43]
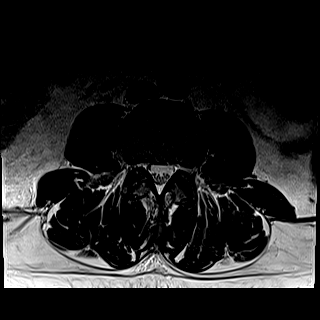
[im 24/43]
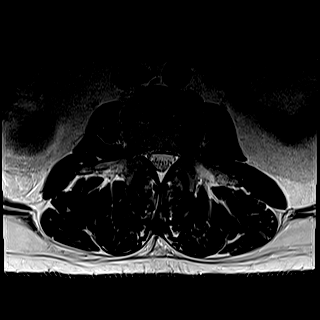
[im 29/43]
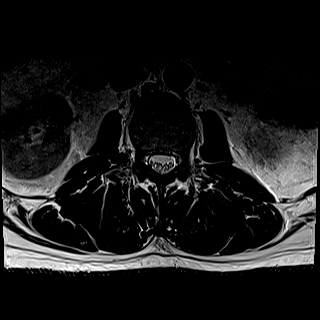
[im 38/43]
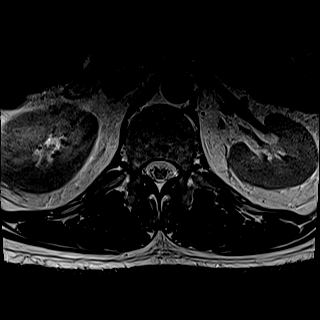
[im 43/43]
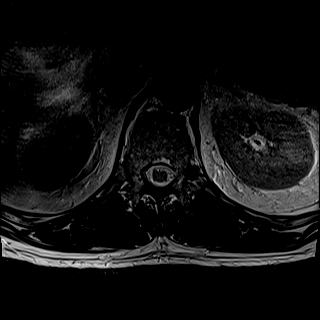

[Series 5: T1 · axial · 4.0mm · 0.39mm/px · z∈[-518,-285]mm · 8 of 43 slices shown (2 of 2)]
[im 1/43]
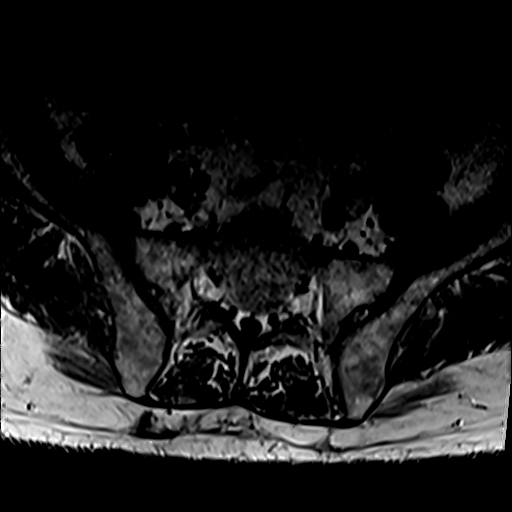
[im 5/43]
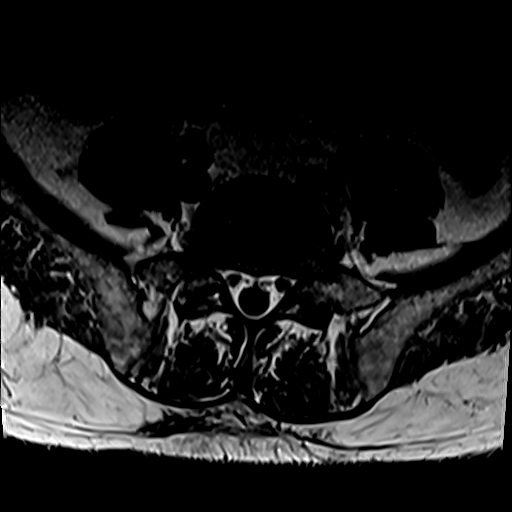
[im 15/43]
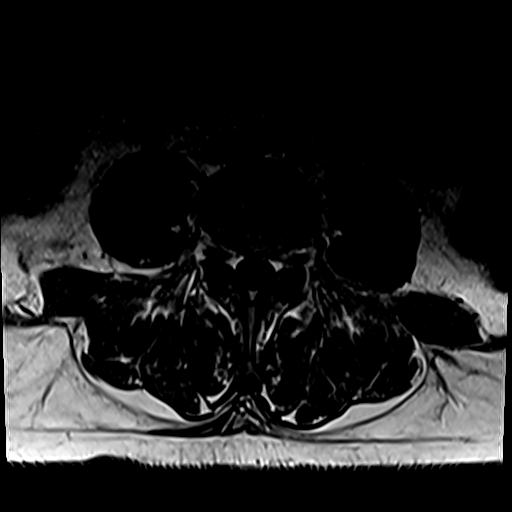
[im 19/43]
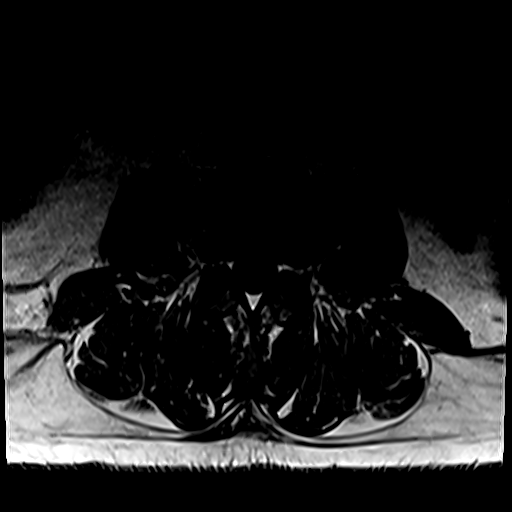
[im 24/43]
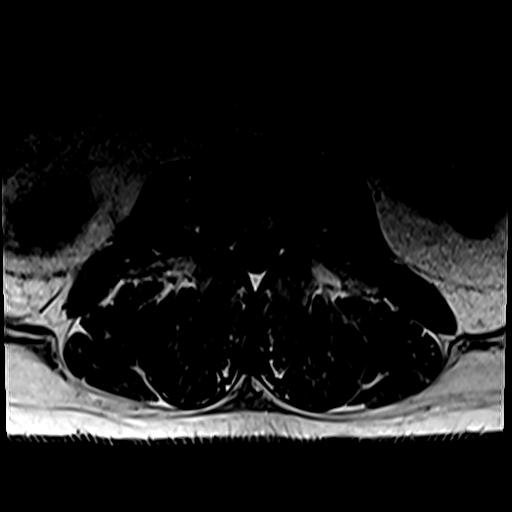
[im 29/43]
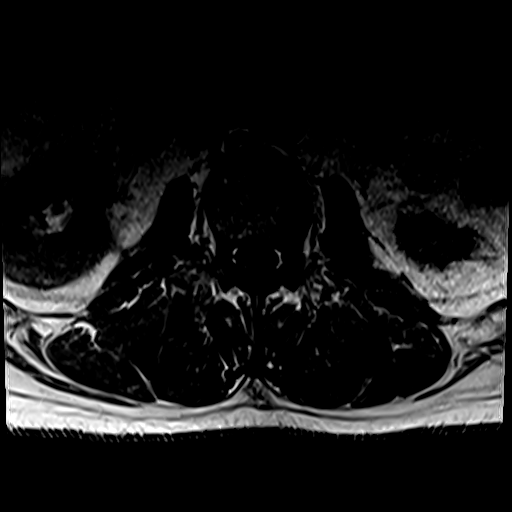
[im 38/43]
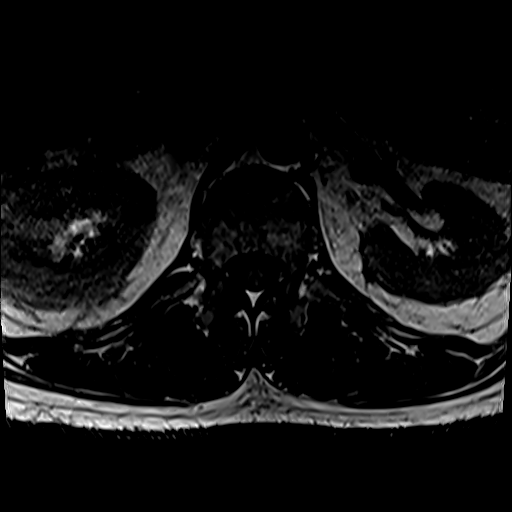
[im 43/43]
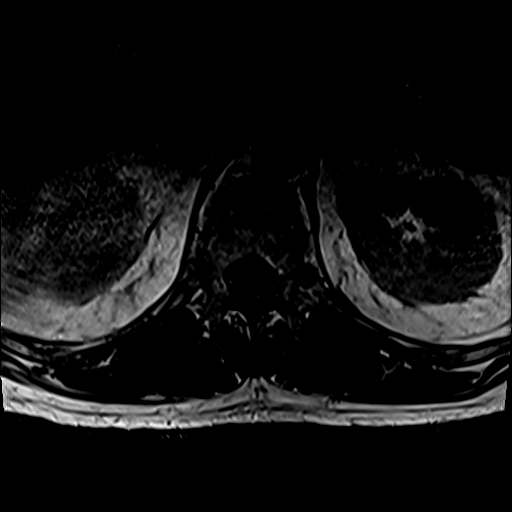

[Series 6: T1 fat-sat post-contrast · sagittal · 4.0mm · 0.81mm/px · 4 of 17 slices shown]
[im 1/17]
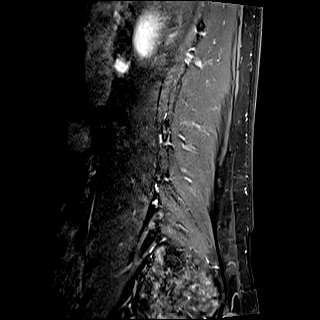
[im 6/17]
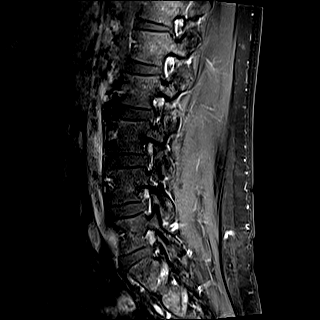
[im 11/17]
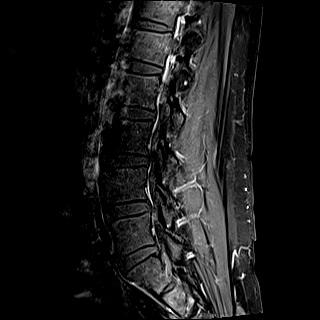
[im 17/17]
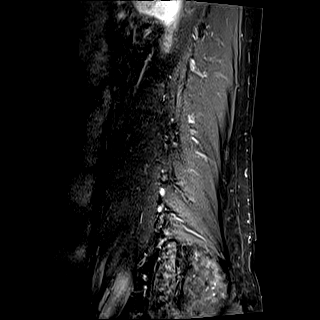

[31 of 48 positions shown; findings below may reference images not displayed]

FINDINGS: MRI THORACIC SPINE FINDINGS

Alignment:  Physiologic.

Vertebrae: There is abnormal bone marrow signal and contrast
enhancement at T4 and T5. There is mild edema of the disc space.
There is a minimally depressed fracture of the superior T5 endplate.

Cord:  Normal signal and morphology.

Paraspinal and other soft tissues: There is phlegmon like contrast
enhancement of the prevertebral soft tissues at the T4 and T5
levels. No epidural abscess.

Disc levels:

There is no spinal canal or neural foraminal stenosis.

MRI LUMBAR SPINE FINDINGS

Segmentation:  Standard.

Alignment:  Physiologic.

Vertebrae:  No fracture, evidence of discitis, or bone lesion.

Conus medullaris: Extends to the L1 level and appears normal.

Paraspinal and other soft tissues: Negative.

Disc levels:

No spinal canal or neural foraminal stenosis.
IMPRESSION: 1. Abnormal bone marrow signal and contrast enhancement at the T4
and T5 levels with mild edema of the disc space and prevertebral
phlegmon/hematoma. The findings could indicate osseous metastatic
disease, multiple myeloma or discitis-osteomyelitis.
2. Minimally depressed compression fracture of T5 without
retropulsion.
3. No epidural abscess.
4. No spinal canal or neural foraminal stenosis.

## 2021-07-23 IMAGING — MR MR THORACIC SPINE WO/W CM
11 of 15 series · 29 of 48 positions shown · IV contrast (GADAVIST)
Comparison: None.

CLINICAL DATA: Bone lesion, T-spine, malignancy suspected; Bone
lesion, L/S-spine, malignancy suspected

EXAM:
MRI THORACIC AND LUMBAR SPINE WITHOUT AND WITH CONTRAST
TECHNIQUE: Multiplanar and multiecho pulse sequences of the thoracic and lumbar
spine were obtained without and with intravenous contrast.
CONTRAST:  10mL GADAVIST GADOBUTROL 1 MMOL/ML IV SOLN

[Series 16: T1 · sagittal · 4.0mm · 1.72mm/px · 1 of 5 slices shown (1 of 3)]
[im 1/5]
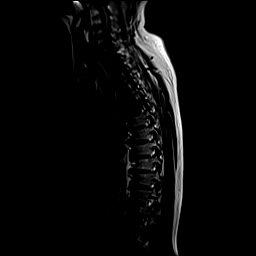

[Series 17: STIR · sagittal · 3.5mm · 1.03mm/px · 2 of 15 slices shown]
[im 1/15]
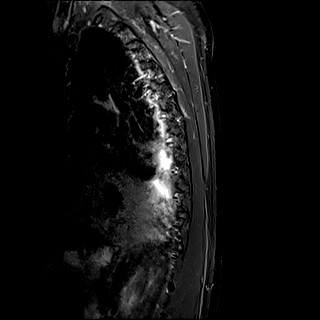
[im 15/15]
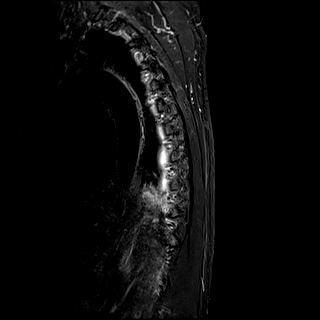

[Series 18: T1 · sagittal · 3.0mm · 1.03mm/px · 2 of 15 slices shown (2 of 3)]
[im 1/15]
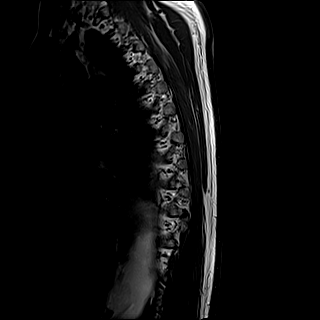
[im 15/15]
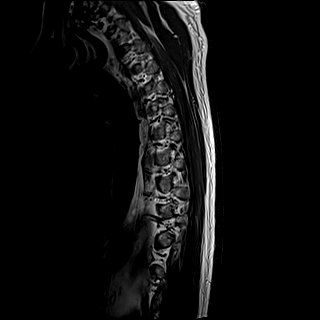

[Series 19: T2 · sagittal · 3.5mm · 0.86mm/px · 2 of 15 slices shown (1 of 2)]
[im 1/15]
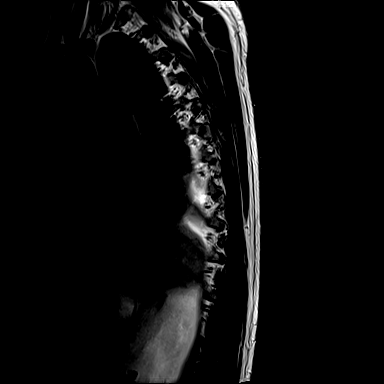
[im 15/15]
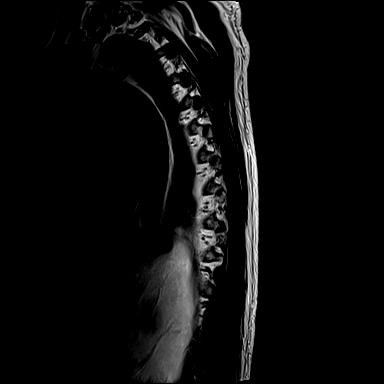

[Series 20: T2 · axial · 5.0mm · 0.78mm/px · z∈[-189,-51]mm · 3 of 26 slices shown (2 of 2)]
[im 1/26]
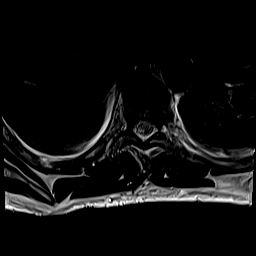
[im 13/26]
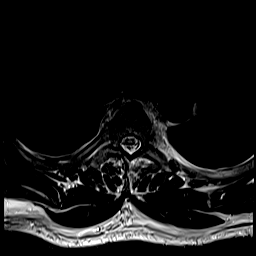
[im 26/26]
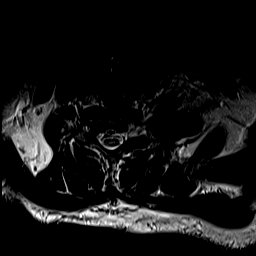

[Series 21: ax t2_lower · axial · 5.0mm · 0.78mm/px · z∈[-317,-161]mm · 3 of 26 slices shown]
[im 1/26]
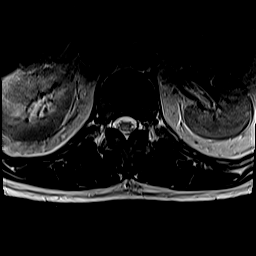
[im 13/26]
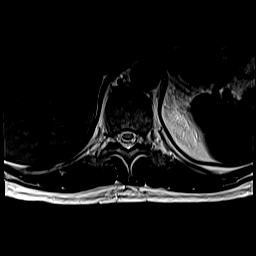
[im 26/26]
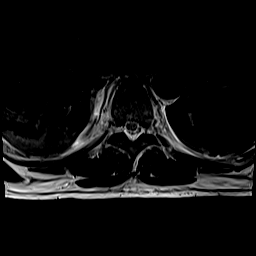

[Series 22: t2_me2d_tra · axial · 5.5mm · 0.39mm/px · z∈[-336,-35]mm · 6 of 46 slices shown]
[im 1/46]
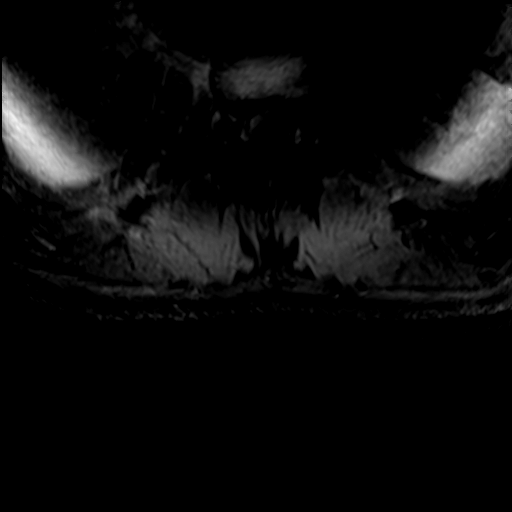
[im 10/46]
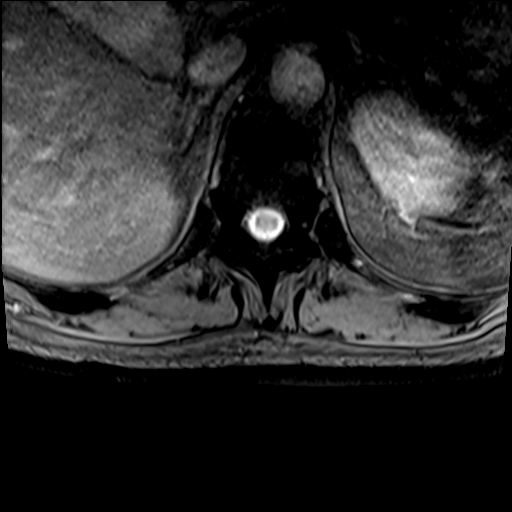
[im 19/46]
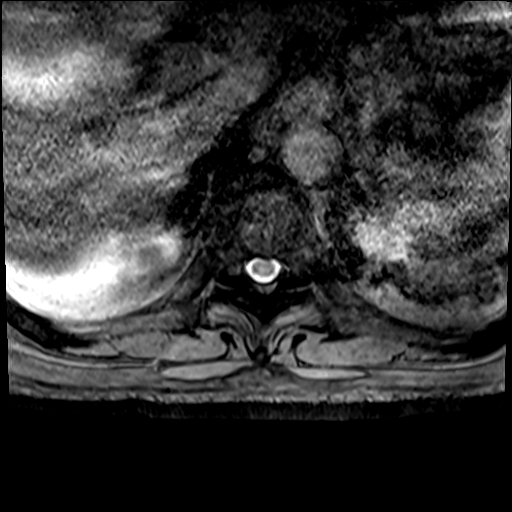
[im 28/46]
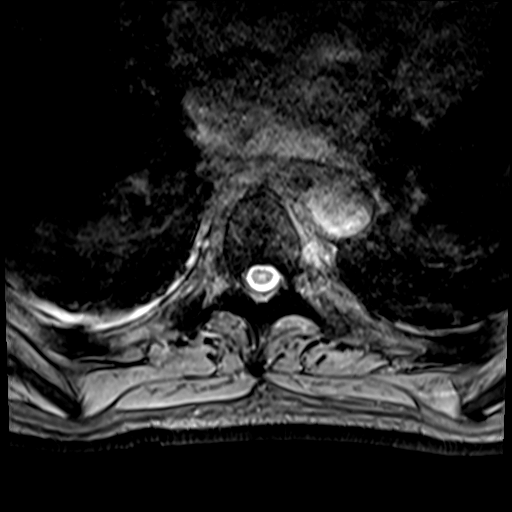
[im 37/46]
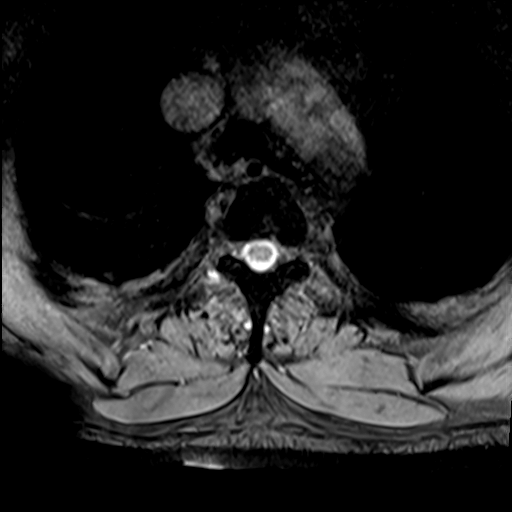
[im 46/46]
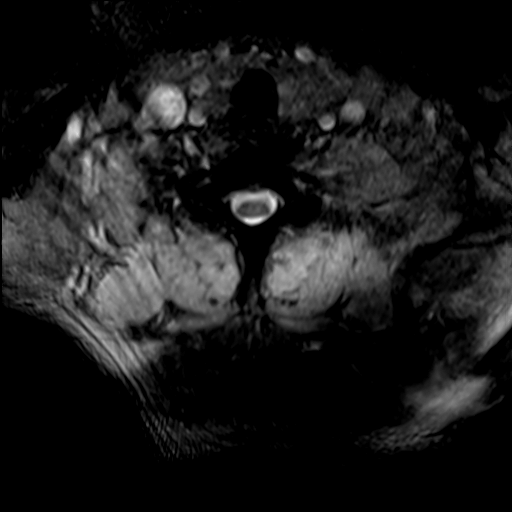

[Series 23: T1 · axial · 5.0mm · 0.39mm/px · z∈[-189,-51]mm · 3 of 26 slices shown (3 of 3)]
[im 1/26]
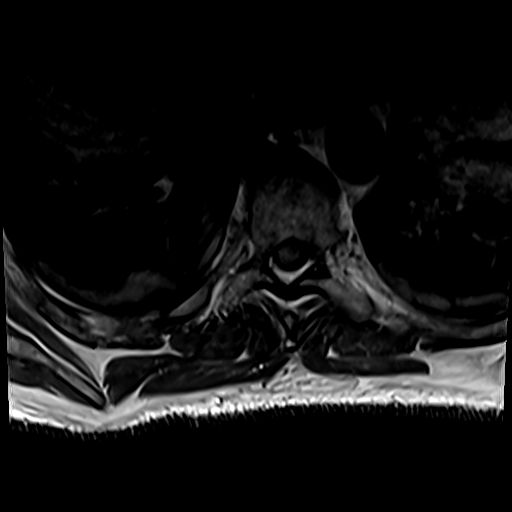
[im 13/26]
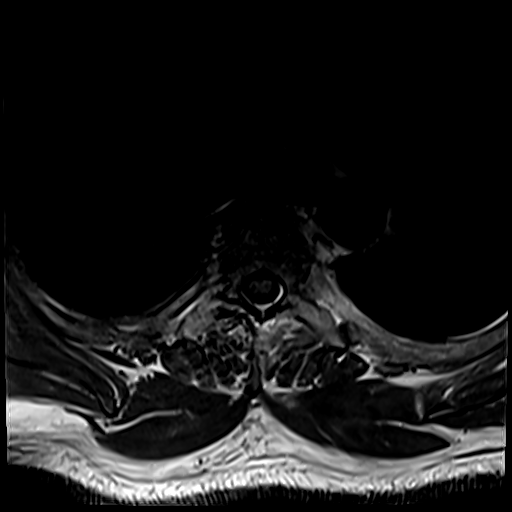
[im 26/26]
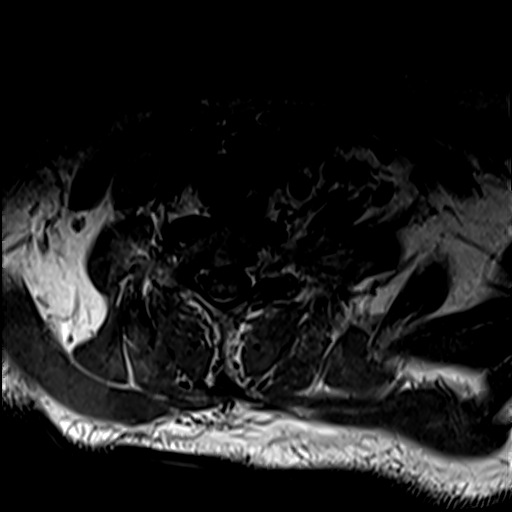

[Series 24: ax t1_lower · axial · 5.0mm · 0.39mm/px · z∈[-317,-161]mm · 3 of 26 slices shown]
[im 1/26]
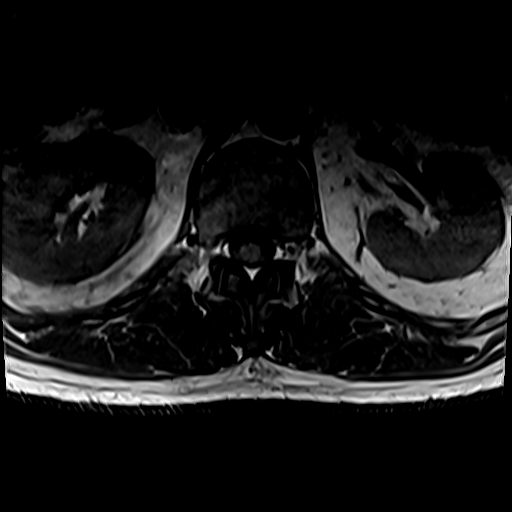
[im 13/26]
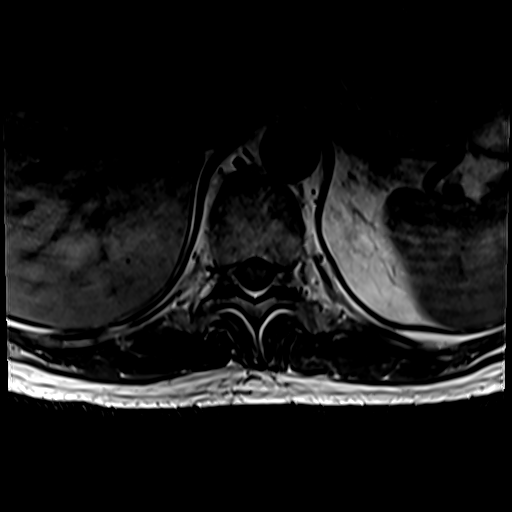
[im 26/26]
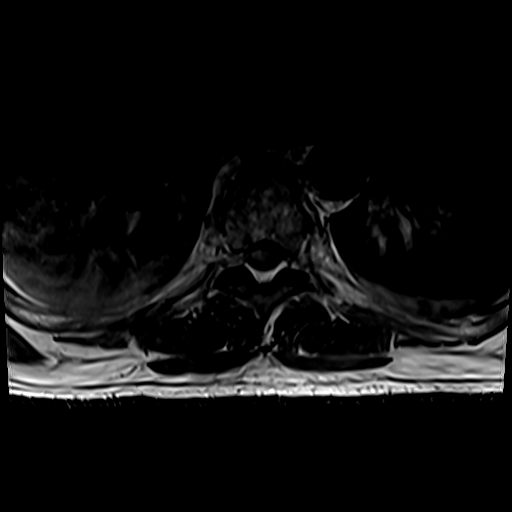

[Series 32: T1 fat-sat post-contrast · sagittal · 3.0mm · 1.03mm/px · 2 of 15 slices shown (1 of 2)]
[im 1/15]
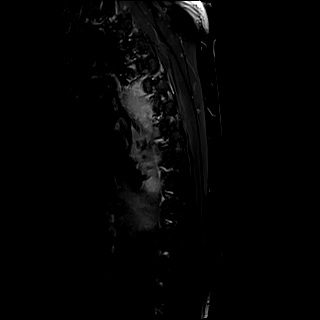
[im 15/15]
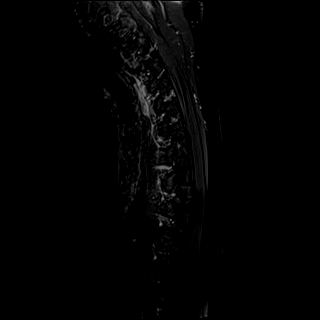

[Series 35: T1 fat-sat post-contrast · sagittal · 3.0mm · 1.03mm/px · 2 of 15 slices shown (2 of 2)]
[im 1/15]
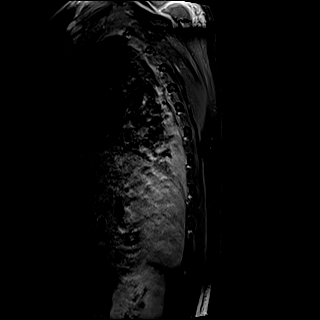
[im 15/15]
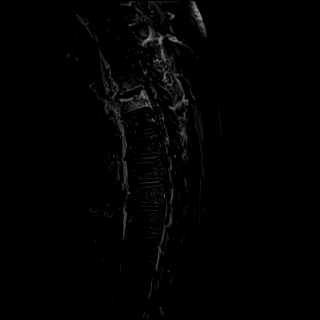

[29 of 48 positions shown; findings below may reference images not displayed]

FINDINGS: MRI THORACIC SPINE FINDINGS

Alignment:  Physiologic.

Vertebrae: There is abnormal bone marrow signal and contrast
enhancement at T4 and T5. There is mild edema of the disc space.
There is a minimally depressed fracture of the superior T5 endplate.

Cord:  Normal signal and morphology.

Paraspinal and other soft tissues: There is phlegmon like contrast
enhancement of the prevertebral soft tissues at the T4 and T5
levels. No epidural abscess.

Disc levels:

There is no spinal canal or neural foraminal stenosis.

MRI LUMBAR SPINE FINDINGS

Segmentation:  Standard.

Alignment:  Physiologic.

Vertebrae:  No fracture, evidence of discitis, or bone lesion.

Conus medullaris: Extends to the L1 level and appears normal.

Paraspinal and other soft tissues: Negative.

Disc levels:

No spinal canal or neural foraminal stenosis.
IMPRESSION: 1. Abnormal bone marrow signal and contrast enhancement at the T4
and T5 levels with mild edema of the disc space and prevertebral
phlegmon/hematoma. The findings could indicate osseous metastatic
disease, multiple myeloma or discitis-osteomyelitis.
2. Minimally depressed compression fracture of T5 without
retropulsion.
3. No epidural abscess.
4. No spinal canal or neural foraminal stenosis.

## 2021-07-23 IMAGING — CT CT ABD-PELV W/O CM
2 of 4 series · 16 of 46 positions shown, 18 images · non-contrast
Comparison: [DATE]

CLINICAL DATA: Cancer of unknown primary, lytic thoracic spine
lesions

EXAM:
CT ABDOMEN AND PELVIS WITHOUT CONTRAST
TECHNIQUE: Multidetector CT imaging of the abdomen and pelvis was performed
following the standard protocol without IV contrast. Unenhanced CT
was performed per clinician order. Lack of IV contrast limits
sensitivity and specificity, especially for evaluation of
abdominal/pelvic solid viscera.

[Series 2: axial st · axial · 0.85mm/px · z∈[-432,+13]mm · 13 of 101 slices shown, 15 images]
[im 6/101  soft-tissue]
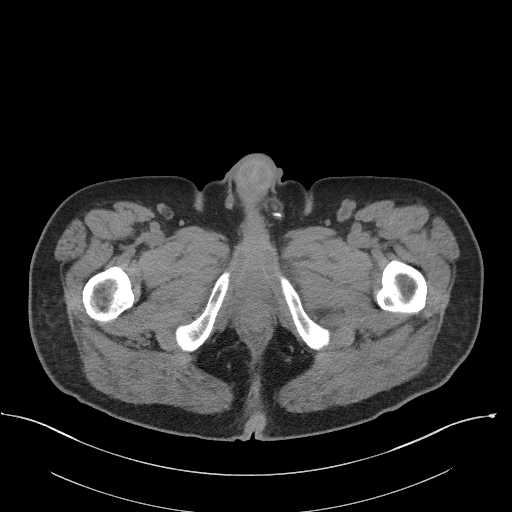
[im 6/101  bone]
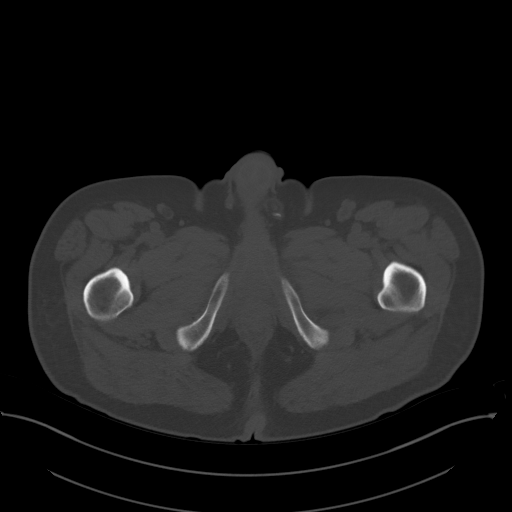
[im 12/101  soft-tissue]
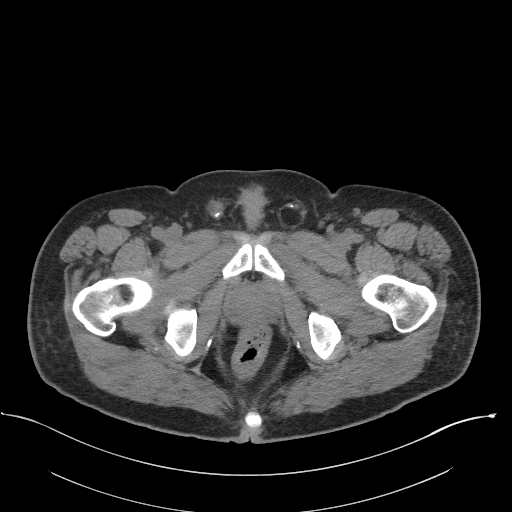
[im 23/101  soft-tissue]
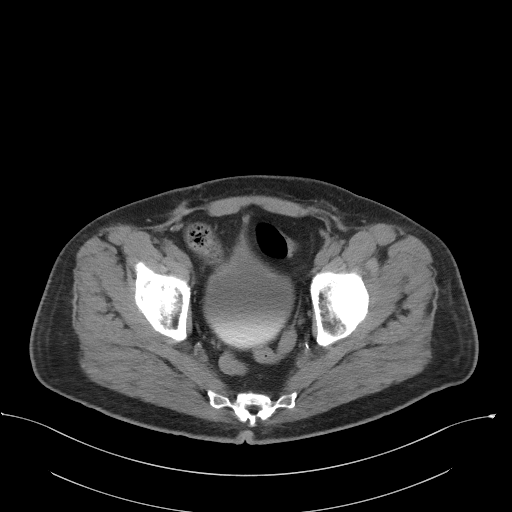
[im 28/101  soft-tissue]
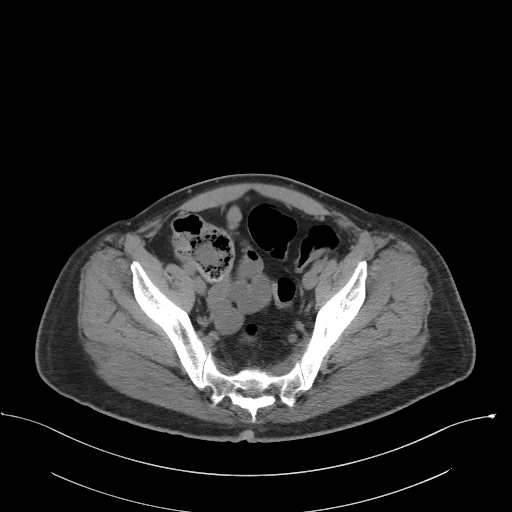
[im 34/101  soft-tissue]
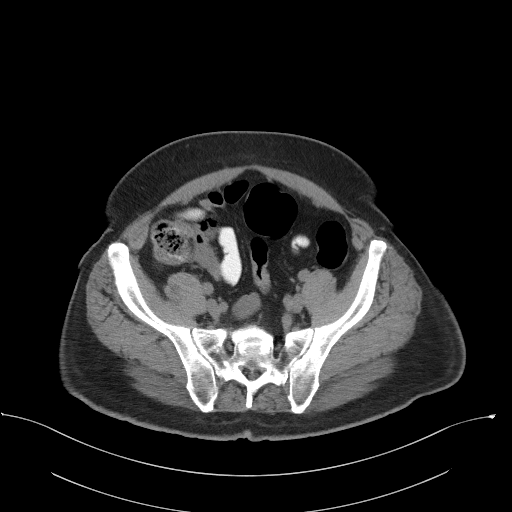
[im 45/101  soft-tissue]
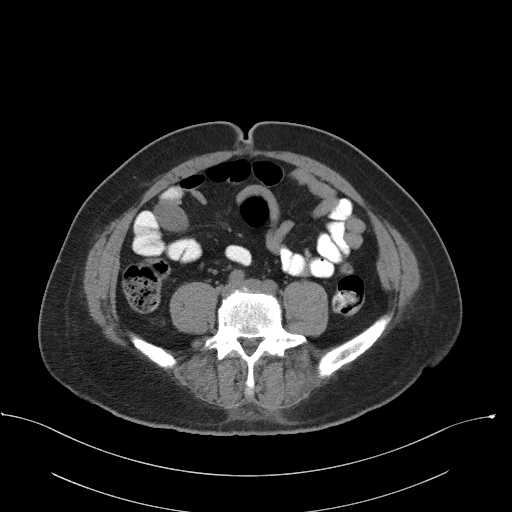
[im 51/101  soft-tissue]
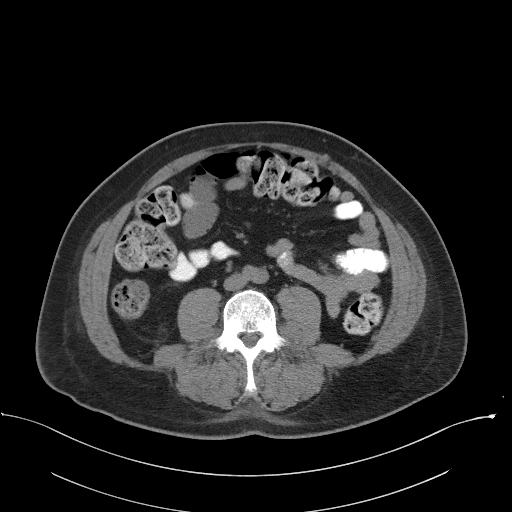
[im 56/101  soft-tissue]
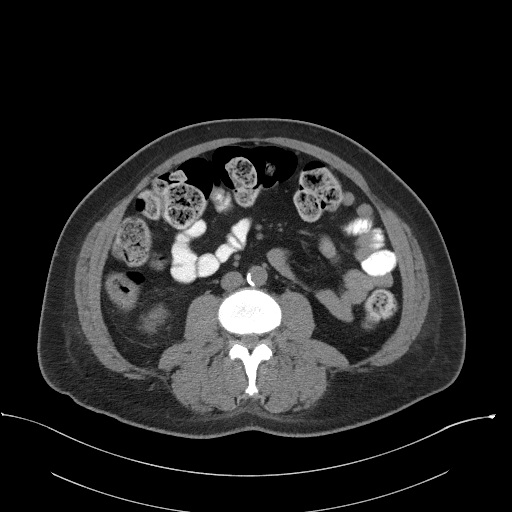
[im 67/101  soft-tissue]
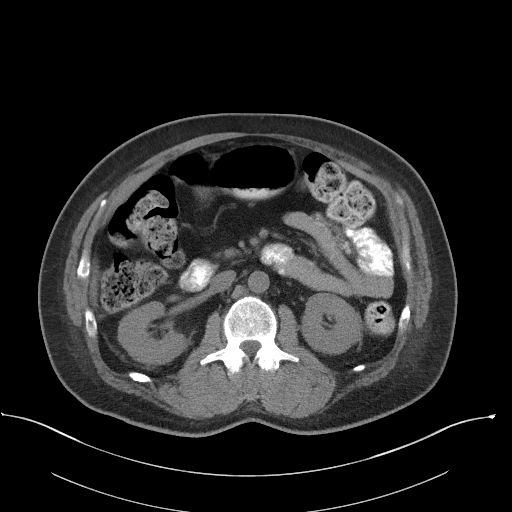
[im 67/101  bone]
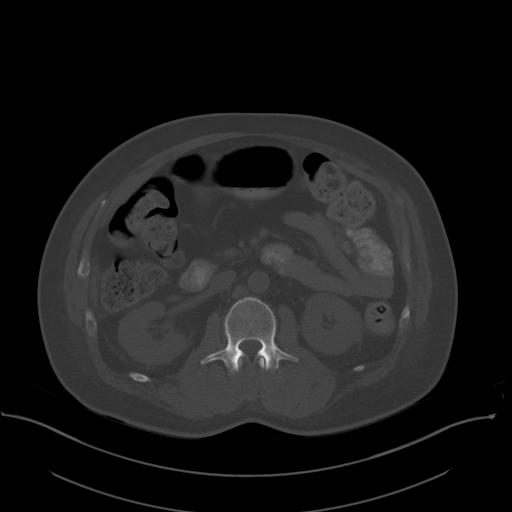
[im 73/101  soft-tissue]
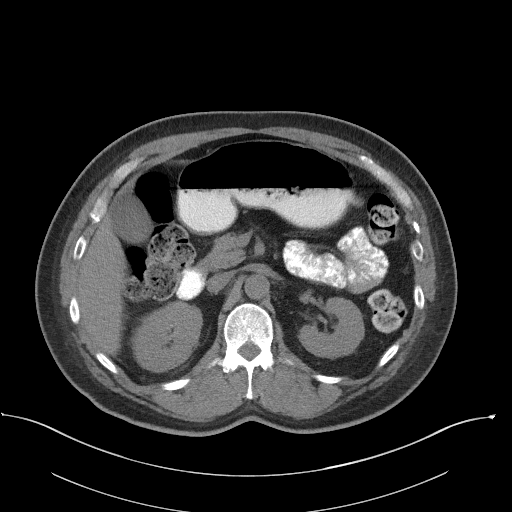
[im 78/101  soft-tissue]
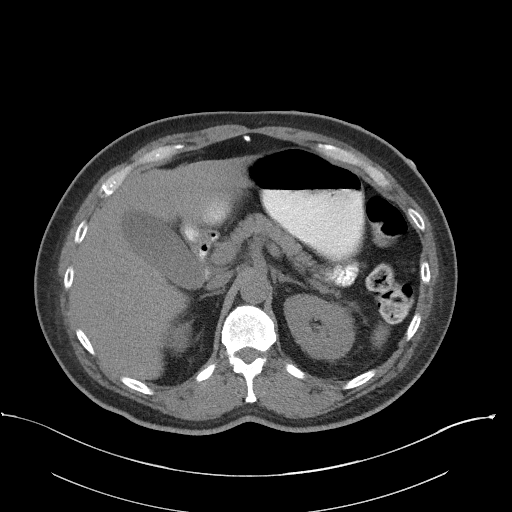
[im 89/101  soft-tissue]
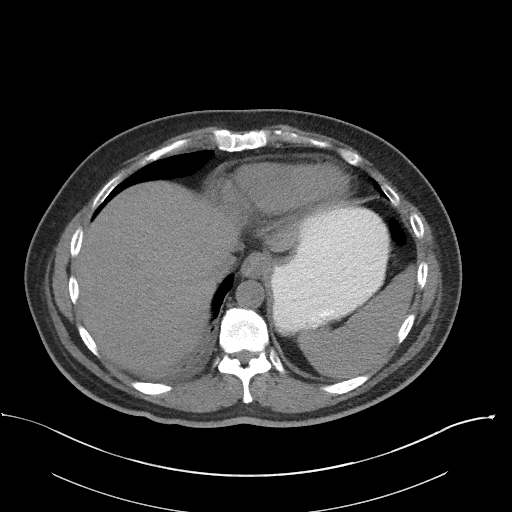
[im 95/101  soft-tissue]
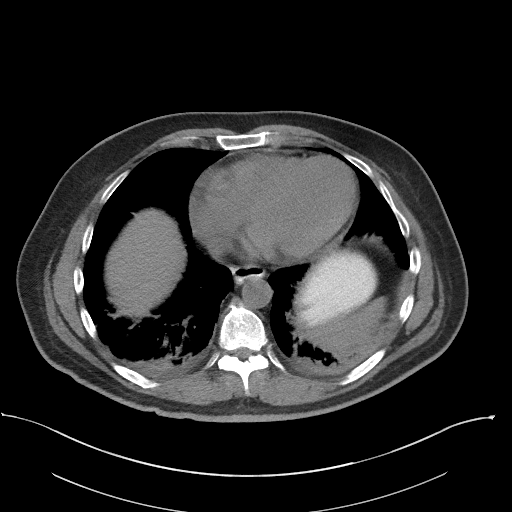

[Series 5: coronal st · coronal · 0.79mm/px · 3 of 101 slices shown]
[im 34/101  soft-tissue]
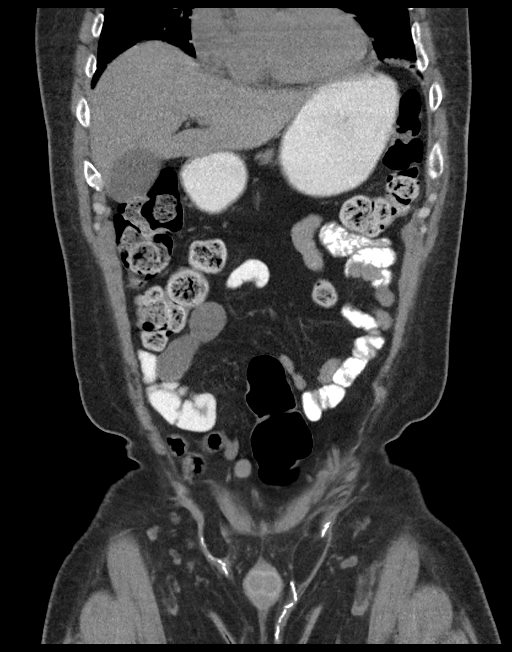
[im 45/101  soft-tissue]
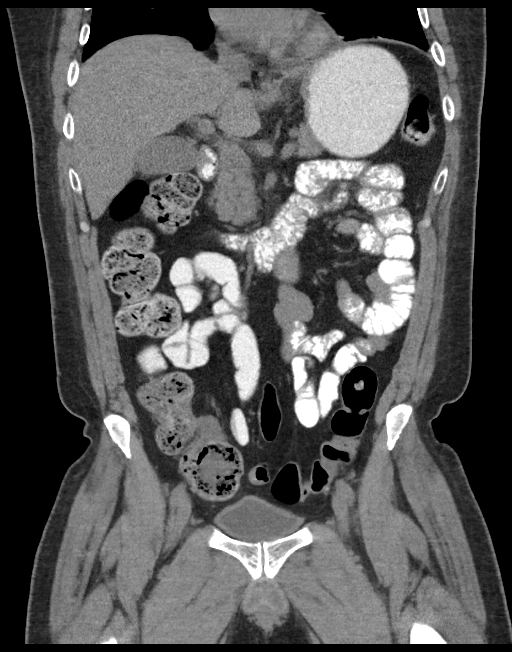
[im 56/101  soft-tissue]
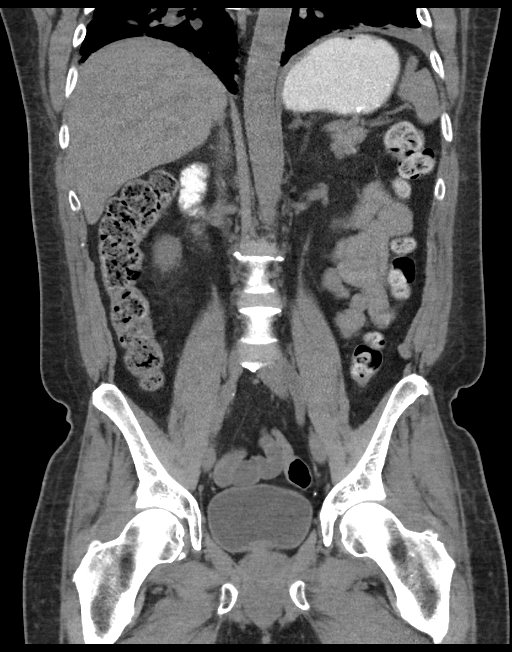

[16 of 46 positions shown; findings below may reference images not displayed]

FINDINGS: Lower chest: Hypoventilatory changes at the dependent lower lobes.
No acute pleural or parenchymal lung disease.

Hepatobiliary: No gross liver abnormalities on this unenhanced exam.
Gallbladder is moderately distended without cholelithiasis or
cholecystitis.

Pancreas: Unremarkable. No pancreatic ductal dilatation or
surrounding inflammatory changes.

Spleen: Normal in size without focal abnormality.

Adrenals/Urinary Tract: No urinary tract calculi or obstructive
uropathy. Excreted contrast within the bladder consistent with
preceding MRI. The adrenals are unremarkable.

Stomach/Bowel: No bowel obstruction or ileus. Moderate retained
stool throughout the colon. The appendix is surgically absent. No
bowel wall thickening or inflammatory change.

Vascular/Lymphatic: Aortic atherosclerosis. No enlarged abdominal or
pelvic lymph nodes.

Reproductive: Prostate is unremarkable.

Other: No free fluid or free gas. Small fat containing bilateral
inguinal hernias. No bowel herniation.

Musculoskeletal: There are no acute or destructive bony lesions.
Reconstructed images demonstrate no additional findings.
IMPRESSION: 1. No acute intra-abdominal or intrapelvic process.
2. Moderate fecal retention consistent with constipation.
3.  Aortic Atherosclerosis ([G5]-[G5]).

## 2021-07-23 MED ORDER — GABAPENTIN 100 MG PO CAPS
200.0000 mg | ORAL_CAPSULE | Freq: Three times a day (TID) | ORAL | Status: DC
  Administered 2021-07-23 – 2021-08-03 (×32): 200 mg via ORAL
  Filled 2021-07-23 (×31): qty 2

## 2021-07-23 MED ORDER — HYDRALAZINE HCL 25 MG PO TABS
25.0000 mg | ORAL_TABLET | Freq: Three times a day (TID) | ORAL | Status: DC
  Administered 2021-07-23 – 2021-07-25 (×7): 25 mg via ORAL
  Filled 2021-07-23 (×8): qty 1

## 2021-07-23 MED ORDER — GADOBUTROL 1 MMOL/ML IV SOLN
10.0000 mL | Freq: Once | INTRAVENOUS | Status: AC | PRN
  Administered 2021-07-23: 10 mL via INTRAVENOUS

## 2021-07-23 MED ORDER — IOHEXOL 9 MG/ML PO SOLN
ORAL | Status: AC
  Administered 2021-07-23: 500 mL
  Filled 2021-07-23: qty 1000

## 2021-07-23 MED ORDER — IOHEXOL 9 MG/ML PO SOLN
500.0000 mL | ORAL | Status: AC
  Administered 2021-07-23: 500 mL via ORAL

## 2021-07-23 MED ORDER — OXYCODONE HCL ER 15 MG PO T12A
15.0000 mg | EXTENDED_RELEASE_TABLET | Freq: Two times a day (BID) | ORAL | Status: DC
  Administered 2021-07-23 – 2021-07-25 (×5): 15 mg via ORAL
  Filled 2021-07-23 (×6): qty 1

## 2021-07-23 MED ORDER — ADULT MULTIVITAMIN W/MINERALS CH
1.0000 | ORAL_TABLET | Freq: Every day | ORAL | Status: DC
  Administered 2021-07-23 – 2021-08-03 (×12): 1 via ORAL
  Filled 2021-07-23 (×13): qty 1

## 2021-07-23 MED ORDER — TRAZODONE HCL 50 MG PO TABS
100.0000 mg | ORAL_TABLET | Freq: Every day | ORAL | Status: DC
  Administered 2021-07-23 – 2021-08-02 (×11): 100 mg via ORAL
  Filled 2021-07-23 (×11): qty 2

## 2021-07-23 MED ORDER — OXYCODONE HCL 5 MG PO TABS
5.0000 mg | ORAL_TABLET | ORAL | Status: DC | PRN
  Administered 2021-07-23 – 2021-07-30 (×13): 5 mg via ORAL
  Filled 2021-07-23 (×13): qty 1

## 2021-07-23 MED ORDER — ACETAMINOPHEN 500 MG PO TABS
1000.0000 mg | ORAL_TABLET | Freq: Four times a day (QID) | ORAL | Status: DC | PRN
  Administered 2021-07-23: 1000 mg via ORAL
  Filled 2021-07-23: qty 2

## 2021-07-23 MED ORDER — ALBUTEROL SULFATE (2.5 MG/3ML) 0.083% IN NEBU: 3.0000 mL | INHALATION_SOLUTION | RESPIRATORY_TRACT | Status: DC | PRN

## 2021-07-23 MED ORDER — POLYETHYLENE GLYCOL 3350 17 G PO PACK
17.0000 g | PACK | Freq: Two times a day (BID) | ORAL | Status: DC
  Administered 2021-07-23 – 2021-07-24 (×2): 17 g via ORAL
  Filled 2021-07-23 (×3): qty 1

## 2021-07-23 MED ORDER — CYANOCOBALAMIN 1000 MCG/ML IJ SOLN
1000.0000 ug | Freq: Every day | INTRAMUSCULAR | Status: AC
  Administered 2021-07-23 – 2021-07-29 (×7): 1000 ug via SUBCUTANEOUS
  Filled 2021-07-23 (×7): qty 1

## 2021-07-23 MED ORDER — SENNOSIDES-DOCUSATE SODIUM 8.6-50 MG PO TABS
1.0000 | ORAL_TABLET | Freq: Two times a day (BID) | ORAL | Status: DC
  Administered 2021-07-23 – 2021-08-03 (×21): 1 via ORAL
  Filled 2021-07-23 (×23): qty 1

## 2021-07-23 MED ORDER — CEFAZOLIN SODIUM-DEXTROSE 2-4 GM/100ML-% IV SOLN
2.0000 g | Freq: Three times a day (TID) | INTRAVENOUS | Status: DC
  Administered 2021-07-23 – 2021-08-03 (×33): 2 g via INTRAVENOUS
  Filled 2021-07-23 (×34): qty 100

## 2021-07-23 MED ORDER — HYDROMORPHONE HCL 1 MG/ML IJ SOLN
1.0000 mg | INTRAMUSCULAR | Status: DC | PRN
  Administered 2021-07-23 – 2021-07-25 (×7): 1 mg via INTRAVENOUS
  Filled 2021-07-23 (×7): qty 1

## 2021-07-23 MED ORDER — OXYCODONE HCL 5 MG PO TABS: 5.0000 mg | ORAL_TABLET | Freq: Two times a day (BID) | ORAL | Status: DC | PRN

## 2021-07-23 MED ORDER — ENSURE ENLIVE PO LIQD
237.0000 mL | Freq: Two times a day (BID) | ORAL | Status: DC
  Administered 2021-07-23 – 2021-07-27 (×4): 237 mL via ORAL

## 2021-07-23 NOTE — Progress Notes (Signed)
Initial Nutrition Assessment  INTERVENTION:   -Ensure Plus PO BID, each provides 350 kcals and 13g protein  -Multivitamin with minerals daily  NUTRITION DIAGNOSIS:   Increased nutrient needs related to chronic illness as evidenced by estimated needs.  GOAL:   Patient will meet greater than or equal to 90% of their needs  MONITOR:   PO intake, Supplement acceptance, Labs, Weight trends, I & O's  REASON FOR ASSESSMENT:   Malnutrition Screening Tool    ASSESSMENT:   45 y.o. male with PMH significant for DM2 with complications including retinopathy, neuropathy, microalbuminuria; HTN, HLD, CKD 3  (baseline creatinine 1.1-1.2), COVID-19 infection in July 2022.  Unable to speak with pt at bedside during visit to floor. Will attempt to gather history at a later time. Pt currently consuming 25-100% of meals at this time. Pt with history of COVID-19 infection in July/August 2022 and appetite has decreased since then. Now with new possible malignancy. Will order Ensure supplements and daily MVI.  Pt needs weight for admission, last recorded weight is 191 lbs on 06/11/21.  Medications: Vitamin B-12, Senokot  Labs reviewed:  CBGs: 124-258  NUTRITION - FOCUSED PHYSICAL EXAM:  Unable to complete at this time.  Diet Order:   Diet Order             Diet Carb Modified Fluid consistency: Thin; Room service appropriate? Yes  Diet effective now                   EDUCATION NEEDS:   Not appropriate for education at this time  Skin:  Skin Assessment: Reviewed RN Assessment  Last BM:  9/27  Height:   Ht Readings from Last 1 Encounters:  06/11/21 '5\' 11"'$  (1.803 m)    Weight:   Wt Readings from Last 1 Encounters:  06/11/21 87.2 kg    BMI:  There is no height or weight on file to calculate BMI.  Estimated Nutritional Needs:   Kcal:  2000-2200 -using IBW  Protein:  105-120g -using IBW  Fluid:  2L/day   Clayton Bibles, MS, RD, LDN Inpatient Clinical  Dietitian Contact information available via Amion

## 2021-07-23 NOTE — Progress Notes (Signed)
PROGRESS NOTE    Jose [name]  CMK:[id number] DOB: 25-Dec-1975 DOA: 07/22/2021 PCP: [name], NP    Chief Complaint  Patient presents with   Chest Pain    Brief Narrative:  Patient 45 year old gentleman history of type 2 diabetes with retinopathy, neuropathy, microalbuminemia, hypertension, hyperlipidemia, chronic kidney disease stage III (baseline 1.1-1.2), COVID-19 infection July 2022 presented to the ED with complaints of chest pain radiating to bilateral shoulder blades x6 weeks.  Seen by PCP x-ray done showed abnormal curvature of the spine MRI ordered which was scheduled for October however due to worsening symptoms presented to the ED.  Patient noted to be afebrile.  Systolic blood pressures in the 160s.  Patient with a leukocytosis, hemoglobin at 7.5, D-dimer slightly elevated with VQ scan negative for PE.  CT chest without contrast with lytic bone lesions involving T5 and T6 and the sternum, concern for pathologic fracture of T6 with paraspinal hematoma or tumor could represent metastatic bone disease or possibly myeloma.  Patient pancultured with blood cultures 3/4 bottles positive for MSSA.  Case discussed with oncology who will formally consulted and recommended a multiple myeloma panel and urine light chains and LDH level which were ordered.  Patient placed on gentle hydration.  Patient placed on pain management.  Patient admitted for further evaluation and work-up.   Assessment & Plan:   Principal Problem:   Lytic bone lesions on xray Active Problems:   Type 2 diabetes mellitus with diabetic polyneuropathy, without long-term current use of insulin (HCC)   Hyperlipidemia   GAD (generalized anxiety disorder)   AKI (acute kidney injury) ([place])   Anemia   MSSA bacteremia   Constipation  #1 lytic bone lesion/disease-highly suspicious for malignancy versus myeloma -Patient presented with 6-week history of chest pain.  Patient with recently diagnosed chronic kidney  disease in March 2022 per patient. -CT chest with lytic bone disease involving T5 and T6 and possibly the sternum, pathologic fracture of T6 with paraspinal hematoma or tumor, findings suggestive of metastatic bone disease or possibly myeloma. -Radiology recommended MRI of the T and L-spine with and without contrast which was ordered after discussions with Dr. Annamaria Boots, oncology. -CT abdomen and pelvis also ordered to follow-up on any further lesions. -Multiple myeloma panel, urine light chains and LDH level ordered. -Oncology has assessed patient, following and patient will likely need bone marrow biopsy to be done on Monday July 27, 2021. -IR consultation and evaluation for possible kyphoplasty. -Oncology following and appreciate input and recommendations. -Placed on OxyContin 15 mg twice daily, oxycodone 5 mg every 4 hours as needed breakthrough pain, IV Dilaudid for severe breakthrough pain. -Increase Neurontin to 200 mg 3 times daily. -Consulted palliative care for symptom management.  2.  MSSA bacteremia -Was notified by pharmacy that 3/4 bottles positive for MSSA bacteremia. -Patient started on IV Ancef. -Check a 2D echo to rule out endocarditis. -We will consult with ID tomorrow for further evaluation and management.  3.  AKI on chronic kidney disease stage IIIa -Baseline creatinine 1.3 from July 2022. -Creatinine elevated on admission at 1.99. -Continue to hold ARB, Lasix. -Gentle hydration with IV fluids. -Renal function trending down, follow.  4.  Diabetes mellitus type 2 -Hemoglobin A1c of 7.29 May 2021. -Patient with long-term complication of retinopathy, neuropathy, microalbuminemia. -Hold home oral hypoglycemic agents. -CBG 124 this morning. -Change Neurontin to 200 mg 3 times daily. -SSI.  5.  Hypertension Patient with significantly elevated blood pressure likely pain component. -Continue to hold ARB and  Lasix due to AKI. -Continue verapamil. -Start hydralazine 25  mg every 8 hours. -IV hydralazine as needed systolic blood pressure > 180  6.  Hyperlipidemia -Continue statin.  7.  Acute on chronic anemia/low vitamin B12 levels -Hemoglobin noted at baseline to be approximately 9 in July. -Patient presented she had a hemoglobin as low as 7.5 likely secondary to chronic kidney disease versus malignancy. -Patient with no overt bleeding. -Hemoglobin currently at 8.6. -May need IV iron during this hospitalization however will defer to oncology. -Vitamin B12 levels at 252 and as such we will place on vitamin B12 1000 MCG's subcutaneously daily. -Could likely transition to oral vitamin B12 on discharge.  8.  Constipation -Place on MiraLAX twice daily, Senokot-S twice daily.   DVT prophylaxis: Heparin Code Status: Full Family Communication: Updated patient and wife at bedside. Disposition:   Status is: Inpatient  Remains inpatient appropriate because:Inpatient level of care appropriate due to severity of illness  Dispo: The patient is from: Home              Anticipated d/c is to: Home              Patient currently is not medically stable to d/c.   Difficult to place patient No       Consultants:  Oncology: Dr. Burr Medico 07/23/2021  Procedures:  CT chest 07/22/2021 Chest x-ray 07/22/2021 VQ scan 07/22/2021   Antimicrobials: IV Ancef 07/23/2021>>>>>>   Subjective: Patient laying in bed trying to eat lunch.  Complain of significant chest and bilateral back pain.  Some pain with deep inspiration.  No nausea or vomiting.  No abdominal pain.  Wife at bedside and very tearful.  Objective: Vitals:   07/23/21 0802 07/23/21 1130 07/23/21 1358 07/23/21 1641  BP: (!) 202/98 (!) 165/88 91/62 99/66   Pulse: (!) 105 (!) 109 90   Resp:   18   Temp:   98.5 F (36.9 C)   TempSrc:   Oral   SpO2:   93%     Intake/Output Summary (Last 24 hours) at 07/23/2021 1802 Last data filed at 07/23/2021 1306 Gross per 24 hour  Intake 1257.85 ml  Output --  Net  1257.85 ml   There were no vitals filed for this visit.  Examination:  General exam: Appears calm and comfortable  Respiratory system: Clear to auscultation anterior lung fields.  No wheezes, no crackles, no rhonchi.Marland Kitchen Respiratory effort normal. Cardiovascular system: S1 & S2 heard, RRR. No JVD, murmurs, rubs, gallops or clicks. No pedal edema. Gastrointestinal system: Abdomen is nondistended, soft and nontender. No organomegaly or masses felt. Normal bowel sounds heard. Central nervous system: Alert and oriented. No focal neurological deficits. Extremities: Symmetric 5 x 5 power. Skin: No rashes, lesions or ulcers Psychiatry: Judgement and insight appear normal. Mood & affect appropriate.     Data Reviewed: I have personally reviewed following labs and imaging studies  CBC: Recent Labs  Lab 07/22/21 1051 07/23/21 0508  WBC 14.3* 14.4*  HGB 7.5* 8.6*  HCT 23.0* 26.2*  MCV 86.1 81.9  PLT 405* 425    Basic Metabolic Panel: Recent Labs  Lab 07/22/21 1051 07/23/21 0508  NA 134* 135  K 4.8 4.3  CL 98 103  CO2 27 25  GLUCOSE 184* 144*  BUN 25* 22*  CREATININE 1.99* 1.38*  CALCIUM 9.4 9.2    GFR: CrCl cannot be calculated (Unknown ideal weight.).  Liver Function Tests: No results for input(s): AST, ALT, ALKPHOS, BILITOT, PROT, ALBUMIN in the last  168 hours.  CBG: Recent Labs  Lab 07/22/21 1407 07/22/21 2259 07/23/21 0754 07/23/21 1246 07/23/21 1626  GLUCAP 130* 155* 124* 258* 148*     Recent Results (from the past 240 hour(s))  Culture, blood (routine x 2)     Status: None (Preliminary result)   Collection Time: 07/22/21  2:11 PM   Specimen: BLOOD  Result Value Ref Range Status   Specimen Description   Final    BLOOD RIGHT ANTECUBITAL Performed at [facility], [place] [address], [place], [place] [address]    Special Requests   Final    BOTTLES DRAWN AEROBIC AND ANAEROBIC Blood Culture results may not be optimal due to an excessive  volume of blood received in culture bottles Performed at [facility] [address], [place], [place] [address]    Culture  Setup Time   Final    GRAM POSITIVE COCCI IN CLUSTERS IN BOTH AEROBIC AND ANAEROBIC BOTTLES CRITICAL RESULT CALLED TO, READ BACK BY AND VERIFIED WITH: PHARMD M.SWAYME AT 6283 ON 07/23/2021 BY T.SAAD. Performed at [facility], [place] [address], [place], [place] [address]    Culture GRAM POSITIVE COCCI IN CLUSTERS  Final   Report Status PENDING  Incomplete  Blood Culture ID Panel (Reflexed)     Status: Abnormal   Collection Time: 07/22/21  2:11 PM  Result Value Ref Range Status   Enterococcus faecalis NOT DETECTED NOT DETECTED Final   Enterococcus Faecium NOT DETECTED NOT DETECTED Final   Listeria monocytogenes NOT DETECTED NOT DETECTED Final   Staphylococcus species DETECTED (A) NOT DETECTED Final    Comment: CRITICAL RESULT CALLED TO, READ BACK BY AND VERIFIED WITH: PHARMD M.SWAYME AT 1547 ON 07/23/2021 BY T.SAAD.    Staphylococcus aureus (BCID) DETECTED (A) NOT DETECTED Final    Comment: CRITICAL RESULT CALLED TO, READ BACK BY AND VERIFIED WITH: PHARMD M.SWAYME AT 1547 ON 07/23/2021 BY T.SAAD.    Staphylococcus epidermidis NOT DETECTED NOT DETECTED Final   Staphylococcus lugdunensis NOT DETECTED NOT DETECTED Final   Streptococcus species NOT DETECTED NOT DETECTED Final   Streptococcus agalactiae NOT DETECTED NOT DETECTED Final   Streptococcus pneumoniae NOT DETECTED NOT DETECTED Final   Streptococcus pyogenes NOT DETECTED NOT DETECTED Final   A.calcoaceticus-baumannii NOT DETECTED NOT DETECTED Final   Bacteroides fragilis NOT DETECTED NOT DETECTED Final   Enterobacterales NOT DETECTED NOT DETECTED Final   Enterobacter cloacae complex NOT DETECTED NOT DETECTED Final   Escherichia coli NOT DETECTED NOT DETECTED Final   Klebsiella aerogenes NOT DETECTED NOT DETECTED Final   Klebsiella oxytoca NOT DETECTED NOT DETECTED Final    Klebsiella pneumoniae NOT DETECTED NOT DETECTED Final   Proteus species NOT DETECTED NOT DETECTED Final   Salmonella species NOT DETECTED NOT DETECTED Final   Serratia marcescens NOT DETECTED NOT DETECTED Final   Haemophilus influenzae NOT DETECTED NOT DETECTED Final   Neisseria meningitidis NOT DETECTED NOT DETECTED Final   Pseudomonas aeruginosa NOT DETECTED NOT DETECTED Final   Stenotrophomonas maltophilia NOT DETECTED NOT DETECTED Final   Candida albicans NOT DETECTED NOT DETECTED Final   Candida auris NOT DETECTED NOT DETECTED Final   Candida glabrata NOT DETECTED NOT DETECTED Final   Candida krusei NOT DETECTED NOT DETECTED Final   Candida parapsilosis NOT DETECTED NOT DETECTED Final   Candida tropicalis NOT DETECTED NOT DETECTED Final   Cryptococcus neoformans/gattii NOT DETECTED NOT DETECTED Final   Meth resistant mecA/C and MREJ NOT DETECTED NOT DETECTED Final  Comment: Performed at [facility], [place] [address], [place], [place] [address]  Culture, blood (routine x 2)     Status: None (Preliminary result)   Collection Time: 07/22/21  3:13 PM   Specimen: BLOOD  Result Value Ref Range Status   Specimen Description   Final    BLOOD LEFT ANTECUBITAL Performed at [facility] [address], [place], [place] [address]    Special Requests   Final    BOTTLES DRAWN AEROBIC ONLY Blood Culture results may not be optimal due to an inadequate volume of blood received in culture bottles Performed at [facility] [address] [place], [place] [address]    Culture   Final    NO GROWTH < 24 HOURS Performed at [facility] [address] [place], [place] [address]    Report Status PENDING  Incomplete  SARS CORONAVIRUS 2 (TAT 6-24 HRS) Nasopharyngeal     Status: None   Collection Time: 07/22/21  6:07 PM   Specimen: Nasopharyngeal  Result Value Ref Range Status   SARS Coronavirus 2 NEGATIVE NEGATIVE Final    Comment:  (NOTE) SARS-CoV-2 target nucleic acids are NOT DETECTED.  The SARS-CoV-2 RNA is generally detectable in upper and lower respiratory specimens during the acute phase of infection. Negative results do not preclude SARS-CoV-2 infection, do not rule out co-infections with other pathogens, and should not be used as the sole basis for treatment or other patient management decisions. Negative results must be combined with clinical observations, patient history, and epidemiological information. The expected result is Negative.  Fact Sheet for Patients: SugarRoll.be  Fact Sheet for Healthcare Providers: https://www.woods-mathews.com/  This test is not yet approved or cleared by the [place] FDA and  has been authorized for detection and/or diagnosis of SARS-CoV-2 by FDA under an Emergency Use Authorization (EUA). This EUA will remain  in effect (meaning this test can be used) for the duration of the COVID-19 declaration under Se ction 564(b)(1) of the Act, 21 U.S.C. section 360bbb-3(b)(1), unless the authorization is terminated or revoked sooner.  Performed at [facility], [place] [address], [place], [place] [address]          Radiology Studies: DG Chest 2 View  Result Date: 07/22/2021 CLINICAL DATA:  chest pain EXAM: CHEST - 2 VIEW COMPARISON:  None. FINDINGS: The cardiomediastinal silhouette is within normal limits. No pleural effusion. No pneumothorax. No lobar consolidation. Minimal left base subsegmental atelectasis. No acute osseous abnormality. IMPRESSION: Minimal left lung base subsegmental atelectasis. No other acute abnormality in the chest. Electronically Signed   By: Albin Felling M.D.   On: 07/22/2021 11:05   CT Chest Wo Contrast  Result Date: 07/22/2021 CLINICAL DATA:  Chest pain EXAM: CT CHEST WITHOUT CONTRAST TECHNIQUE: Multidetector CT imaging of the chest was performed following the standard protocol without IV  contrast. COMPARISON:  None. FINDINGS: Cardiovascular: The heart is within normal limits in size. No pericardial effusion the aorta is normal in caliber. Minimal scattered atherosclerotic calcifications. Age advanced coronary artery calcifications. Mediastinum/Nodes: Small scattered mediastinal and hilar lymph nodes but no mass or overt adenopathy. Borderline enlarged subcarinal node measuring 12 mm could be related to the thoracic spine process. The esophagus is grossly normal. Lungs/Pleura: Areas of streaky bibasilar atelectasis. No focal airspace consolidation/pneumonia. No worrisome pulmonary lesions. No pleural effusions or pleural lesions. Upper Abdomen: No significant upper abdominal findings. Musculoskeletal: No chest wall mass, supraclavicular or axillary adenopathy. There are several  small bilateral thyroid nodules. None of these measures greater than 12 mm. Not clinically significant; no follow-up imaging recommended (ref: J Am Coll Radiol. 2015 Feb;12(2): 143-50). There are lytic destructive bone lesions involving the T5 and T6 vertebral bodies with a mild pathologic compression fracture of T6. Paraspinal hematoma or tumor noted at both these levels. No definite findings for spinal canal compromise. Mild lucency involving the sternum could also reflect lytic bone disease. I do not see any obvious rib lesions or pathologic rib fractures. IMPRESSION: 1. 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 There is a pathologic fracture of T6 with paraspinal hematoma or tumor. This could represent metastatic bone disease or possibly myeloma. 2. No obvious primary neoplasm involving the chest or upper abdomen. PET-CT may be helpful for further evaluation and extent of the bone disease and possibly detecting a primary neoplasm. 3. Thoracic spine MRI without and with contrast may be helpful for further evaluation of the thoracic disease. I do not see any any obvious spinal canal compromise. Aortic  Atherosclerosis (ICD10-I70.0). Electronically Signed   By: Marijo Sanes M.D.   On: 07/22/2021 16:51   NM Pulmonary Perfusion  Result Date: 07/22/2021 CLINICAL DATA:  PE suspected, low/intermediate probability positive D-dimer. EXAM: NUCLEAR MEDICINE PERFUSION LUNG SCAN TECHNIQUE: Perfusion images were obtained in multiple projections after intravenous injection of radiopharmaceutical. Ventilation scans intentionally deferred if perfusion scan and chest x-ray adequate for interpretation during COVID 19 epidemic. RADIOPHARMACEUTICALS:  4.4 mCi Tc-22mMAA IV COMPARISON:  None. FINDINGS: Normal perfusion.  No focal defects. IMPRESSION: No evidence for PE. Electronically Signed   By: CSan MorelleM.D.   On: 07/22/2021 16:26        Scheduled Meds:  atorvastatin  40 mg Oral Daily   cyanocobalamin  1,000 mcg Subcutaneous Daily   cyclobenzaprine  10 mg Oral BID   feeding supplement  237 mL Oral BID BM   gabapentin  200 mg Oral TID   heparin  5,000 Units Subcutaneous Q8H   hydrALAZINE  25 mg Oral Q8H   insulin aspart  0-5 Units Subcutaneous QHS   insulin aspart  0-9 Units Subcutaneous TID WC   multivitamin with minerals  1 tablet Oral Daily   oxyCODONE  15 mg Oral Q12H   polyethylene glycol  17 g Oral BID   senna-docusate  1 tablet Oral BID   traZODone  100 mg Oral QHS   verapamil  120 mg Oral Daily   Continuous Infusions:  sodium chloride     sodium chloride 75 mL/hr at 07/23/21 0316    ceFAZolin (ANCEF) IV 2 g (07/23/21 1633)     LOS: 1 day    Time spent: 40 minutes    DIrine Seal MD [facility]   To contact the attending provider between 7A-7P or the covering provider during after hours 7P-7A, please log into the web site [email or web address] and access using universal [place] password for that web site. If you do not have the password, please call the hospital operator.  07/23/2021, 6:02 PM

## 2021-07-23 NOTE — Progress Notes (Signed)
PHARMACY - PHYSICIAN COMMUNICATION CRITICAL VALUE ALERT - BLOOD CULTURE IDENTIFICATION (BCID)  Jose [name] is an 45 y.o. male who presented to [facility] on 07/22/2021 with a chief complaint of chest pain, back pain. PMH significant for T2DM.   Assessment:  BCID + 2/3 MSSA  Name of physician (or Provider) Contacted: Dr. Grandville Silos (text paged)  Current antibiotics: None  Changes to prescribed antibiotics recommended: Initiate cefazolin 2 g IV q8h per protocol  Results for orders placed or performed during the hospital encounter of 07/22/21  Blood Culture ID Panel (Reflexed) (Collected: 07/22/2021  2:11 PM)  Result Value Ref Range   Enterococcus faecalis NOT DETECTED NOT DETECTED   Enterococcus Faecium NOT DETECTED NOT DETECTED   Listeria monocytogenes NOT DETECTED NOT DETECTED   Staphylococcus species DETECTED (A) NOT DETECTED   Staphylococcus aureus (BCID) DETECTED (A) NOT DETECTED   Staphylococcus epidermidis NOT DETECTED NOT DETECTED   Staphylococcus lugdunensis NOT DETECTED NOT DETECTED   Streptococcus species NOT DETECTED NOT DETECTED   Streptococcus agalactiae NOT DETECTED NOT DETECTED   Streptococcus pneumoniae NOT DETECTED NOT DETECTED   Streptococcus pyogenes NOT DETECTED NOT DETECTED   A.calcoaceticus-baumannii NOT DETECTED NOT DETECTED   Bacteroides fragilis NOT DETECTED NOT DETECTED   Enterobacterales NOT DETECTED NOT DETECTED   Enterobacter cloacae complex NOT DETECTED NOT DETECTED   Escherichia coli NOT DETECTED NOT DETECTED   Klebsiella aerogenes NOT DETECTED NOT DETECTED   Klebsiella oxytoca NOT DETECTED NOT DETECTED   Klebsiella pneumoniae NOT DETECTED NOT DETECTED   Proteus species NOT DETECTED NOT DETECTED   Salmonella species NOT DETECTED NOT DETECTED   Serratia marcescens NOT DETECTED NOT DETECTED   Haemophilus influenzae NOT DETECTED NOT DETECTED   Neisseria meningitidis NOT DETECTED NOT DETECTED   Pseudomonas aeruginosa NOT DETECTED NOT DETECTED    Stenotrophomonas maltophilia NOT DETECTED NOT DETECTED   Candida albicans NOT DETECTED NOT DETECTED   Candida auris NOT DETECTED NOT DETECTED   Candida glabrata NOT DETECTED NOT DETECTED   Candida krusei NOT DETECTED NOT DETECTED   Candida parapsilosis NOT DETECTED NOT DETECTED   Candida tropicalis NOT DETECTED NOT DETECTED   Cryptococcus neoformans/gattii NOT DETECTED NOT DETECTED   Meth resistant mecA/C and MREJ NOT DETECTED NOT [place], PharmD 07/23/2021  3:51 PM

## 2021-07-23 NOTE — Consult Note (Signed)
[place]  [phone] [phone]   MEDICAL ONCOLOGY - INITIAL CONSULTATION  Referral MD: Dr. Irine Seal  Reason for Referral: Lytic bone lesion, anemia, renal insufficiency  HPI: Mr. Luu is a 45 year old male with a past medical history significant for diabetes mellitus with complications including retinopathy, neuropathy, and microalbuminuria, hypertension, hyperlipidemia, CKD, COVID-19 in July 2022.  The patient presented to the emergency department with complaints of chest pain which radiated to both shoulders x6 weeks.  He went to his PCP and chiropractor and had x-rays which showed abnormal curvature in the spine.  He was ordered an MRI which was scheduled for October.  Due to worsening of his symptoms, he came to the emergency department.  On admission, his WBC was 14.3, hemoglobin 7.5, platelets 405,000, sodium 134, BUN 25, creatinine 1.99.  D-dimer was elevated at 1.10.  Stool for occult blood was negative.  He received 1 unit PRBCs. VQ scan showed normal perfusion no evidence for PE.  CT chest without contrast showed lytic bone disease involving T5 and T6 and possibly the sternum, pathologic fracture of T6 with paraspinal hematoma or tumor which could represent metastatic bone disease or possibly myeloma.  There was no obvious primary neoplasm involving the chest or upper abdomen.  Additional work-up was ordered including a PSA which is normal at 0.35.  Serum light chains and myeloma panel are currently pending.  UPEP has been ordered and still needs to be sent to the lab.  The patient reports that he has had a lack of appetite since having COVID earlier this summer.  He thinks that he may have lost weight due to this lack of appetite.  He has not had any recent fevers or chills.  Other than chest pain that radiates to his shoulders, he denies any other pain.  He is not having any headaches, dizziness, shortness of breath, cough, abdominal pain,  nausea, vomiting.  No bleeding.  No lower extremity edema.  The patient is married and has 2 biologic children.  He also has 2 stepchildren.  He denies alcohol use.  He previously smoked a pack of cigarettes for 20 years but quit in October 2021.  Family history significant for a younger brother with a pituitary tumor which was surgically removed.  Medical oncology was asked to see the patient for recommendations regarding his lytic bone lesion.  Past Medical History:  Diagnosis Date   GAD (generalized anxiety disorder)    Hyperlipidemia    Macular degeneration, bilateral    Retinopathy    Type II diabetes mellitus with complication, uncontrolled (Waucoma)    retinopathy, neuropathy, microalbuminuria  :   Past Surgical History:  Procedure Laterality Date   APPENDECTOMY     HERNIA REPAIR     TRIGGER FINGER RELEASE Right 10/25/2019   Procedure: RIGHT INDEX FINGER RELEASE TRIGGER FINGER/A-1 PULLEY;  Surgeon: Daryll Brod, MD;  Location: [facility];  Service: Orthopedics;  Laterality: Right;  IV REGIONAL FOREARM BLOCK  :   Current Facility-Administered Medications  Medication Dose Route Frequency Provider Last Rate Last Admin   0.9 %  sodium chloride infusion  10 mL/hr Intravenous Once [place], Greggory Brandy, MD       0.9 %  sodium chloride infusion   Intravenous Continuous Dahal, Marlowe Aschoff, MD 75 mL/hr at 07/23/21 0316 Infusion Verify at 07/23/21 0316   acetaminophen (TYLENOL) tablet 1,000 mg  1,000 mg Oral Q6H PRN Eugenie Filler, MD   1,000 mg at 07/23/21  1120   albuterol (PROVENTIL) (2.5 MG/3ML) 0.083% nebulizer solution 3 mL  3 mL Inhalation Q4H PRN Eugenie Filler, MD       ALPRAZolam Duanne Moron) tablet 0.25 mg  0.25 mg Oral BID PRN Dahal, Marlowe Aschoff, MD       atorvastatin (LIPITOR) tablet 40 mg  40 mg Oral Daily Dahal, Binaya, MD   40 mg at 07/23/21 1002   ceFAZolin (ANCEF) IVPB 2g/100 mL premix  2 g Intravenous Q8H Lenis Noon, RPH 200 mL/hr at 07/23/21 1633 2 g at 07/23/21 1633    cyanocobalamin ((VITAMIN B-12)) injection 1,000 mcg  1,000 mcg Subcutaneous Daily Eugenie Filler, MD   1,000 mcg at 07/23/21 1428   cyclobenzaprine (FLEXERIL) tablet 10 mg  10 mg Oral BID Terrilee Croak, MD   10 mg at 07/23/21 1002   feeding supplement (ENSURE ENLIVE / ENSURE PLUS) liquid 237 mL  237 mL Oral BID BM Eugenie Filler, MD   237 mL at 07/23/21 1419   gabapentin (NEURONTIN) capsule 200 mg  200 mg Oral TID Eugenie Filler, MD       heparin injection 5,000 Units  5,000 Units Subcutaneous Q8H Terrilee Croak, MD   5,000 Units at 07/23/21 1428   hydrALAZINE (APRESOLINE) injection 10 mg  10 mg Intravenous Q6H PRN Terrilee Croak, MD   10 mg at 07/23/21 0786   hydrALAZINE (APRESOLINE) tablet 25 mg  25 mg Oral Q8H Eugenie Filler, MD   25 mg at 07/23/21 1006   HYDROmorphone (DILAUDID) injection 1 mg  1 mg Intravenous Q4H PRN Eugenie Filler, MD   1 mg at 07/23/21 1635   insulin aspart (novoLOG) injection 0-5 Units  0-5 Units Subcutaneous QHS Dahal, Binaya, MD       insulin aspart (novoLOG) injection 0-9 Units  0-9 Units Subcutaneous TID WC Dahal, Marlowe Aschoff, MD   1 Units at 07/23/21 1729   iohexol (OMNIPAQUE) 9 MG/ML oral solution 500 mL  500 mL Oral Q1H Eugenie Filler, MD       iohexol (OMNIPAQUE) 9 MG/ML oral solution            multivitamin with minerals tablet 1 tablet  1 tablet Oral Daily Eugenie Filler, MD   1 tablet at 07/23/21 1400   oxyCODONE (Oxy IR/ROXICODONE) immediate release tablet 5 mg  5 mg Oral Q4H PRN Eugenie Filler, MD       oxyCODONE (OXYCONTIN) 12 hr tablet 15 mg  15 mg Oral Q12H Eugenie Filler, MD   15 mg at 07/23/21 1428   polyethylene glycol (MIRALAX / GLYCOLAX) packet 17 g  17 g Oral BID Eugenie Filler, MD   17 g at 07/23/21 1729   senna-docusate (Senokot-S) tablet 1 tablet  1 tablet Oral BID Eugenie Filler, MD   1 tablet at 07/23/21 1003   traZODone (DESYREL) tablet 100 mg  100 mg Oral QHS Eugenie Filler, MD       verapamil (CALAN-SR)  CR tablet 120 mg  120 mg Oral Daily Dahal, Marlowe Aschoff, MD   120 mg at 07/23/21 1004     No Known Allergies:   Family History  Problem Relation Age of Onset   Diabetes Mother    Hyperlipidemia Mother    Stroke Mother    Diabetes Father    Hyperlipidemia Brother    Stroke Brother    ADD / ADHD Brother    ADD / ADHD Son   :   Social History  Socioeconomic History   Marital status: Significant Other    Spouse name: Not on file   Number of children: 2   Years of education: Not on file   Highest education level: Bachelor's degree (e.g., BA, AB, BS)  Occupational History   Occupation: Multimedia programmer: [employer]  Tobacco Use   Smoking status: Former    Packs/day: 1.00    Years: 20.00    Pack years: 20.00    Types: Cigarettes    Quit date: 07/2020    Years since quitting: 0.9   Smokeless tobacco: Never  Vaping Use   Vaping Use: Never used  Substance and Sexual Activity   Alcohol use: Not Currently   Drug use: Never   Sexual activity: Yes    Partners: Female    Birth control/protection: None    Comment: with monogamous partner  Other Topics Concern   Not on file  Social History Narrative   Pt has lived 23 of life in [place]. Lives at home with partner, 2 kids, 8 cats, and 1 dog.    Social Determinants of Health   Financial Resource Strain: Not on file  Food Insecurity: Not on file  Transportation Needs: Not on file  Physical Activity: Not on file  Stress: Not on file  Social Connections: Not on file  Intimate Partner Violence: Not on file  :  Review of Systems: A comprehensive 14 point review of systems was negative except as noted in the HPI.  Exam: Patient Vitals for the past 24 hrs:  BP Temp Temp src Pulse Resp SpO2  07/23/21 1641 99/66 -- -- -- -- --  07/23/21 1358 91/62 98.5 F (36.9 C) Oral 90 18 93 %  07/23/21 1130 (!) 165/88 -- -- (!) 109 -- --  07/23/21 0802 (!) 202/98 -- -- (!) 105 -- --  07/23/21 0626 (!)  159/86 -- -- 92 -- --  07/23/21 0512 (!) 185/97 98.4 F (36.9 C) Oral (!) 101 20 96 %  07/23/21 0235 (!) 163/90 98.5 F (36.9 C) Oral 98 18 97 %  07/23/21 0048 (!) 179/82 -- -- (!) 105 -- --  07/22/21 2340 (!) 167/90 98.5 F (36.9 C) Oral (!) 105 16 97 %  07/22/21 2311 (!) 171/95 98.4 F (36.9 C) Oral (!) 107 15 97 %  07/22/21 2258 (!) 171/95 98.4 F (36.9 C) Oral (!) 107 15 97 %  07/22/21 2004 (!) 167/100 -- -- -- -- --  07/22/21 1858 (!) 150/88 98.6 F (37 C) Oral (!) 101 18 98 %     General:  well-nourished in no acute distress.   Eyes:  no scleral icterus.   ENT:  There were no oropharyngeal lesions.   Lymphatics:  Negative cervical, supraclavicular or axillary adenopathy.   Respiratory: lungs were clear bilaterally without wheezing or crackles.   Cardiovascular:  Regular rate and rhythm, S1/S2, without murmur, rub or gallop.  There was no pedal edema.   GI:  abdomen was soft, flat, nontender, nondistended, without organomegaly.   Musculoskeletal:  no spinal tenderness of palpation of vertebral spine.   Skin exam was without echymosis, petichae.   Neuro exam was nonfocal. Patient was alert and oriented.  Attention was good.   Language was appropriate.  Mood was normal without depression.  Speech was not pressured.  Thought content was not tangential.     Lab Results  Component Value Date   WBC 14.4 (H) 07/23/2021   HGB 8.6 (L) 07/23/2021  HCT 26.2 (L) 07/23/2021   PLT 371 07/23/2021   GLUCOSE 144 (H) 07/23/2021   CHOL 116 11/22/2019   TRIG 140 05/18/2021   HDL 42 11/22/2019   LDLCALC 57 11/22/2019   ALT 19 05/20/2021   AST 17 05/20/2021   NA 135 07/23/2021   K 4.3 07/23/2021   CL 103 07/23/2021   CREATININE 1.38 (H) 07/23/2021   BUN 22 (H) 07/23/2021   CO2 25 07/23/2021    DG Chest 2 View  Result Date: 07/22/2021 CLINICAL DATA:  chest pain EXAM: CHEST - 2 VIEW COMPARISON:  None. FINDINGS: The cardiomediastinal silhouette is within normal limits. No pleural  effusion. No pneumothorax. No lobar consolidation. Minimal left base subsegmental atelectasis. No acute osseous abnormality. IMPRESSION: Minimal left lung base subsegmental atelectasis. No other acute abnormality in the chest. Electronically Signed   By: Albin Felling M.D.   On: 07/22/2021 11:05   DG Chest 2 View  Result Date: 06/28/2021 CLINICAL DATA:  Right-sided chest pain and right arm pain. EXAM: CHEST - 2 VIEW COMPARISON:  05/18/2021 FINDINGS: The heart size and mediastinal contours are within normal limits. Both lungs are clear. The visualized skeletal structures are unremarkable. IMPRESSION: No active cardiopulmonary disease. Electronically Signed   By: Franchot Gallo M.D.   On: 06/28/2021 09:41   CT Chest Wo Contrast  Result Date: 07/22/2021 CLINICAL DATA:  Chest pain EXAM: CT CHEST WITHOUT CONTRAST TECHNIQUE: Multidetector CT imaging of the chest was performed following the standard protocol without IV contrast. COMPARISON:  None. FINDINGS: Cardiovascular: The heart is within normal limits in size. No pericardial effusion the aorta is normal in caliber. Minimal scattered atherosclerotic calcifications. Age advanced coronary artery calcifications. Mediastinum/Nodes: Small scattered mediastinal and hilar lymph nodes but no mass or overt adenopathy. Borderline enlarged subcarinal node measuring 12 mm could be related to the thoracic spine process. The esophagus is grossly normal. Lungs/Pleura: Areas of streaky bibasilar atelectasis. No focal airspace consolidation/pneumonia. No worrisome pulmonary lesions. No pleural effusions or pleural lesions. Upper Abdomen: No significant upper abdominal findings. Musculoskeletal: No chest wall mass, supraclavicular or axillary adenopathy. There are several small bilateral thyroid nodules. None of these measures greater than 12 mm. Not clinically significant; no follow-up imaging recommended (ref: J Am Coll Radiol. 2015 Feb;12(2): 143-50). There are lytic  destructive bone lesions involving the T5 and T6 vertebral bodies with a mild pathologic compression fracture of T6. Paraspinal hematoma or tumor noted at both these levels. No definite findings for spinal canal compromise. Mild lucency involving the sternum could also reflect lytic bone disease. I do not see any obvious rib lesions or pathologic rib fractures. IMPRESSION: 1. 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 There is a pathologic fracture of T6 with paraspinal hematoma or tumor. This could represent metastatic bone disease or possibly myeloma. 2. No obvious primary neoplasm involving the chest or upper abdomen. PET-CT may be helpful for further evaluation and extent of the bone disease and possibly detecting a primary neoplasm. 3. Thoracic spine MRI without and with contrast may be helpful for further evaluation of the thoracic disease. I do not see any any obvious spinal canal compromise. Aortic Atherosclerosis (ICD10-I70.0). Electronically Signed   By: Marijo Sanes M.D.   On: 07/22/2021 16:51   NM Pulmonary Perfusion  Result Date: 07/22/2021 CLINICAL DATA:  PE suspected, low/intermediate probability positive D-dimer. EXAM: NUCLEAR MEDICINE PERFUSION LUNG SCAN TECHNIQUE: Perfusion images were obtained in multiple projections after intravenous injection of radiopharmaceutical. Ventilation scans intentionally  deferred if perfusion scan and chest x-ray adequate for interpretation during COVID 19 epidemic. RADIOPHARMACEUTICALS:  4.4 mCi Tc-39mMAA IV COMPARISON:  None. FINDINGS: Normal perfusion.  No focal defects. IMPRESSION: No evidence for PE. Electronically Signed   By: CSan MorelleM.D.   On: 07/22/2021 16:26     DG Chest 2 View  Result Date: 07/22/2021 CLINICAL DATA:  chest pain EXAM: CHEST - 2 VIEW COMPARISON:  None. FINDINGS: The cardiomediastinal silhouette is within normal limits. No pleural effusion. No pneumothorax. No lobar consolidation. Minimal left base  subsegmental atelectasis. No acute osseous abnormality. IMPRESSION: Minimal left lung base subsegmental atelectasis. No other acute abnormality in the chest. Electronically Signed   By: YAlbin FellingM.D.   On: 07/22/2021 11:05   DG Chest 2 View  Result Date: 06/28/2021 CLINICAL DATA:  Right-sided chest pain and right arm pain. EXAM: CHEST - 2 VIEW COMPARISON:  05/18/2021 FINDINGS: The heart size and mediastinal contours are within normal limits. Both lungs are clear. The visualized skeletal structures are unremarkable. IMPRESSION: No active cardiopulmonary disease. Electronically Signed   By: CFranchot GalloM.D.   On: 06/28/2021 09:41   CT Chest Wo Contrast  Result Date: 07/22/2021 CLINICAL DATA:  Chest pain EXAM: CT CHEST WITHOUT CONTRAST TECHNIQUE: Multidetector CT imaging of the chest was performed following the standard protocol without IV contrast. COMPARISON:  None. FINDINGS: Cardiovascular: The heart is within normal limits in size. No pericardial effusion the aorta is normal in caliber. Minimal scattered atherosclerotic calcifications. Age advanced coronary artery calcifications. Mediastinum/Nodes: Small scattered mediastinal and hilar lymph nodes but no mass or overt adenopathy. Borderline enlarged subcarinal node measuring 12 mm could be related to the thoracic spine process. The esophagus is grossly normal. Lungs/Pleura: Areas of streaky bibasilar atelectasis. No focal airspace consolidation/pneumonia. No worrisome pulmonary lesions. No pleural effusions or pleural lesions. Upper Abdomen: No significant upper abdominal findings. Musculoskeletal: No chest wall mass, supraclavicular or axillary adenopathy. There are several small bilateral thyroid nodules. None of these measures greater than 12 mm. Not clinically significant; no follow-up imaging recommended (ref: J Am Coll Radiol. 2015 Feb;12(2): 143-50). There are lytic destructive bone lesions involving the T5 and T6 vertebral bodies with a mild  pathologic compression fracture of T6. Paraspinal hematoma or tumor noted at both these levels. No definite findings for spinal canal compromise. Mild lucency involving the sternum could also reflect lytic bone disease. I do not see any obvious rib lesions or pathologic rib fractures. IMPRESSION: 1. 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 There is a pathologic fracture of T6 with paraspinal hematoma or tumor. This could represent metastatic bone disease or possibly myeloma. 2. No obvious primary neoplasm involving the chest or upper abdomen. PET-CT may be helpful for further evaluation and extent of the bone disease and possibly detecting a primary neoplasm. 3. Thoracic spine MRI without and with contrast may be helpful for further evaluation of the thoracic disease. I do not see any any obvious spinal canal compromise. Aortic Atherosclerosis (ICD10-I70.0). Electronically Signed   By: PMarijo SanesM.D.   On: 07/22/2021 16:51   NM Pulmonary Perfusion  Result Date: 07/22/2021 CLINICAL DATA:  PE suspected, low/intermediate probability positive D-dimer. EXAM: NUCLEAR MEDICINE PERFUSION LUNG SCAN TECHNIQUE: Perfusion images were obtained in multiple projections after intravenous injection of radiopharmaceutical. Ventilation scans intentionally deferred if perfusion scan and chest x-ray adequate for interpretation during COVID 19 epidemic. RADIOPHARMACEUTICALS:  4.4 mCi Tc-918mAA IV COMPARISON:  None. FINDINGS: Normal  perfusion.  No focal defects. IMPRESSION: No evidence for PE. Electronically Signed   By: San Morelle M.D.   On: 07/22/2021 16:26    Assessment and Plan:  1.  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  Pathologic fracture of T6 3.  Normocytic anemia 4.  Leukocytosis 5.  AKI on CKD, improved 6.  Diabetes mellitus with retinopathy, neuropathy, and microalbuminuria 7.  Hypertension 8.  Hyperlipidemia  -Discussed CT scan findings and lab results with  the patient today.  Findings concerning for underlying malignancy such as myeloma versus prostate cancer versus other.  His PSA was normal making prostate cancer less likely.  He has no other abnormal findings on CT scan of the chest.  Await light chains, UPEP, myeloma panel.  May also consider additional imaging such as CT of the abdomen/pelvis, MRI, and/or bone survey.  Final recommendations per Dr. Burr Medico. -The patient's hemoglobin improved following transfusion.  His ferritin, folate, and B12 levels are all normal.  Transfuse for hemoglobin less than 7.5.  Thank you for this referral.   Mikey Bussing, DNP, AGPCNP-BC, AOCNP   .Addendum  I have seen the patient, examined him. I agree with the assessment and and plan and have edited the notes.   45 year old gentleman with prior history of diabetes and diabetic complications, presented with upper back pain for 6 weeks, weakness and weight loss.  He had a COVID infection about 2 months ago.  I have reviewed his lab results and CT images and discussed the findings with pt. This is concerning for malignancy. 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  Multiple myeloma panel results is pending.  As solid tumor with bone metastasis is also possibility, CT chest was negative, I would recommend a CT abdomen pelvis to rule out other primary malignancy.  For his significant back pain, and pathological fracture at T6, I would recommend thoracic and lumbar spine MRI with and without contrast, and also IR consulted for kyphoplasty.  We will tentatively plan for bone marrow biopsy on Monday, anticipating a light chain will be back this weekend.  I will check with our nurse practitioner Mendel Ryder to see if she can do it. I will f/u closely. I have communicated with Dr. Grandville Silos about his work up and  management. Will f/u.   Truitt Merle  07/23/2021

## 2021-07-23 NOTE — Consult Note (Signed)
Jose [name]  [phone] [phone]   MEDICAL ONCOLOGY - INITIAL CONSULTATION  Referral MD: Dr. Irine Seal  Reason for Referral: Lytic bone lesion, anemia, renal insufficiency  HPI: Jose [name] is a 45 year old male with a past medical history significant for diabetes mellitus with complications including retinopathy, neuropathy, and microalbuminuria, hypertension, hyperlipidemia, CKD, COVID-19 in July 2022.  The patient presented to the emergency department with complaints of chest pain which radiated to both shoulders x6 weeks.  He went to his PCP and chiropractor and had x-rays which showed abnormal curvature in the spine.  He was ordered an MRI which was scheduled for October.  Due to worsening of his symptoms, he came to the emergency department.  On admission, his WBC was 14.3, hemoglobin 7.5, platelets 405,000, sodium 134, BUN 25, creatinine 1.99.  D-dimer was elevated at 1.10.  Stool for occult blood was negative.  He received 1 unit PRBCs. VQ scan showed normal perfusion no evidence for PE.  CT chest without contrast showed lytic bone disease involving T5 and T6 and possibly the sternum, pathologic fracture of T6 with paraspinal hematoma or tumor which could represent metastatic bone disease or possibly myeloma.  There was no obvious primary neoplasm involving the chest or upper abdomen.  Additional work-up was ordered including a PSA which is normal at 0.35.  Serum light chains and myeloma panel are currently pending.  UPEP has been ordered and still needs to be sent to the lab.  The patient reports that he has had a lack of appetite since having COVID earlier this summer.  He thinks that he may have lost weight due to this lack of appetite.  He has not had any recent fevers or chills.  Other than chest pain that radiates to his shoulders, he denies any other pain.  He is not having any headaches, dizziness, shortness of breath, cough, abdominal pain,  nausea, vomiting.  No bleeding.  No lower extremity edema.  The patient is married and has 2 biologic children.  He also has 2 stepchildren.  He denies alcohol use.  He previously smoked a pack of cigarettes for 20 years but quit in October 2021.  Family history significant for a younger brother with a pituitary tumor which was surgically removed.  Medical oncology was asked to see the patient for recommendations regarding his lytic bone lesion.  Past Medical History:  Diagnosis Date   GAD (generalized anxiety disorder)    Hyperlipidemia    Macular degeneration, bilateral    Retinopathy    Type II diabetes mellitus with complication, uncontrolled ([place])    retinopathy, neuropathy, microalbuminuria  :   Past Surgical History:  Procedure Laterality Date   APPENDECTOMY     HERNIA REPAIR     TRIGGER FINGER RELEASE Right 10/25/2019   Procedure: RIGHT INDEX FINGER RELEASE TRIGGER FINGER/A-1 PULLEY;  Surgeon: Daryll Brod, MD;  Location: [facility];  Service: Orthopedics;  Laterality: Right;  IV REGIONAL FOREARM BLOCK  :   Current Facility-Administered Medications  Medication Dose Route Frequency Provider Last Rate Last Admin   0.9 %  sodium chloride infusion  10 mL/hr Intravenous Once [place], Greggory Brandy, MD       0.9 %  sodium chloride infusion   Intravenous Continuous Dahal, Marlowe Aschoff, MD 75 mL/hr at 07/23/21 0316 Infusion Verify at 07/23/21 0316   acetaminophen (TYLENOL) tablet 650 mg  650 mg Oral Q6H PRN Terrilee Croak, MD       Or  acetaminophen (TYLENOL) suppository 650 mg  650 mg Rectal Q6H PRN Dahal, Binaya, MD       albuterol (PROVENTIL) (2.5 MG/3ML) 0.083% nebulizer solution 2.5 mg  2.5 mg Nebulization Q6H PRN Dahal, Binaya, MD       ALPRAZolam Duanne Moron) tablet 0.25 mg  0.25 mg Oral BID PRN Dahal, Marlowe Aschoff, MD       atorvastatin (LIPITOR) tablet 40 mg  40 mg Oral Daily Dahal, Binaya, MD   40 mg at 07/22/21 2009   cyclobenzaprine (FLEXERIL) tablet 10 mg  10 mg Oral BID Terrilee Croak, MD   10 mg at 07/22/21 2230   gabapentin (NEURONTIN) capsule 300 mg  300 mg Oral QHS Dahal, Marlowe Aschoff, MD   300 mg at 07/22/21 2230   heparin injection 5,000 Units  5,000 Units Subcutaneous Q8H Dahal, Marlowe Aschoff, MD   5,000 Units at 07/23/21 0644   hydrALAZINE (APRESOLINE) injection 10 mg  10 mg Intravenous Q6H PRN Terrilee Croak, MD   10 mg at 07/22/21 2358   HYDROmorphone (DILAUDID) injection 0.5 mg  0.5 mg Intravenous Q4H PRN Dahal, Marlowe Aschoff, MD   0.5 mg at 07/23/21 0554   insulin aspart (novoLOG) injection 0-5 Units  0-5 Units Subcutaneous QHS Dahal, Binaya, MD       insulin aspart (novoLOG) injection 0-9 Units  0-9 Units Subcutaneous TID WC Dahal, Binaya, MD       lidocaine (LIDODERM) 5 % 1 patch  1 patch Transdermal Q24H Dahal, Binaya, MD       oxyCODONE (Oxy IR/ROXICODONE) immediate release tablet 5 mg  5 mg Oral Q4H PRN Dahal, Marlowe Aschoff, MD   5 mg at 07/23/21 0730   verapamil (CALAN-SR) CR tablet 120 mg  120 mg Oral Daily Dahal, Marlowe Aschoff, MD   120 mg at 07/22/21 2004     No Known Allergies:   Family History  Problem Relation Age of Onset   Diabetes Mother    Hyperlipidemia Mother    Stroke Mother    Diabetes Father    Hyperlipidemia Brother    Stroke Brother    ADD / ADHD Brother    ADD / ADHD Son   :   Social History   Socioeconomic History   Marital status: Significant Other    Spouse name: Not on file   Number of children: 2   Years of education: Not on file   Highest education level: Bachelor's degree (e.g., BA, AB, BS)  Occupational History   Occupation: Multimedia programmer: [employer]  Tobacco Use   Smoking status: Former    Packs/day: 1.00    Years: 20.00    Pack years: 20.00    Types: Cigarettes    Quit date: 07/2020    Years since quitting: 0.9   Smokeless tobacco: Never  Vaping Use   Vaping Use: Never used  Substance and Sexual Activity   Alcohol use: Not Currently   Drug use: Never   Sexual activity: Yes    Partners: Female     Birth control/protection: None    Comment: with monogamous partner  Other Topics Concern   Not on file  Social History Narrative   Pt has lived 51 of life in [place]. Lives at home with partner, 2 kids, 8 cats, and 1 dog.    Social Determinants of Health   Financial Resource Strain: Not on file  Food Insecurity: Not on file  Transportation Needs: Not on file  Physical Activity: Not on file  Stress: Not on file  Social Connections: Not on file  Intimate Partner Violence: Not on file  :  Review of Systems: A comprehensive 14 point review of systems was negative except as noted in the HPI.  Exam: Patient Vitals for the past 24 hrs:  BP Temp Temp src Pulse Resp SpO2  07/23/21 0626 (!) 159/86 -- -- 92 -- --  07/23/21 0512 (!) 185/97 98.4 F (36.9 C) Oral (!) 101 20 96 %  07/23/21 0235 (!) 163/90 98.5 F (36.9 C) Oral 98 18 97 %  07/23/21 0048 (!) 179/82 -- -- (!) 105 -- --  07/22/21 2340 (!) 167/90 98.5 F (36.9 C) Oral (!) 105 16 97 %  07/22/21 2311 (!) 171/95 98.4 F (36.9 C) Oral (!) 107 15 97 %  07/22/21 2258 (!) 171/95 98.4 F (36.9 C) Oral (!) 107 15 97 %  07/22/21 2004 (!) 167/100 -- -- -- -- --  07/22/21 1858 (!) 150/88 98.6 F (37 C) Oral (!) 101 18 98 %  07/22/21 1700 (!) 195/100 -- -- (!) 107 20 --  07/22/21 1500 (!) 163/94 -- -- (!) 102 18 99 %  07/22/21 1445 (!) 162/97 -- -- (!) 105 -- 98 %  07/22/21 1430 (!) 168/94 -- -- (!) 104 -- 100 %  07/22/21 1415 (!) 164/98 -- -- (!) 103 -- 100 %  07/22/21 1400 (!) 152/80 -- -- 96 -- 99 %  07/22/21 1356 (!) 153/80 -- -- 98 18 100 %  07/22/21 1345 (!) 153/80 -- -- 94 18 99 %  07/22/21 1330 119/69 -- -- 90 -- 100 %  07/22/21 1315 117/67 -- -- 90 -- 99 %  07/22/21 1300 131/74 -- -- 87 -- 98 %  07/22/21 1245 134/73 -- -- 89 18 99 %  07/22/21 1230 123/74 -- -- 87 -- 100 %  07/22/21 1215 119/75 -- -- 89 -- 100 %  07/22/21 1200 119/70 -- -- 86 -- 98 %  07/22/21 1145 114/70 -- -- 87 -- 98 %  07/22/21 1141 109/68  -- -- 89 18 99 %  07/22/21 1015 124/76 -- -- 99 -- 100 %  07/22/21 1009 (!) 147/78 97.7 F (36.5 C) Oral (!) 103 18 100 %    General:  well-nourished in no acute distress.   Eyes:  no scleral icterus.   ENT:  There were no oropharyngeal lesions.   Lymphatics:  Negative cervical, supraclavicular or axillary adenopathy.   Respiratory: lungs were clear bilaterally without wheezing or crackles.   Cardiovascular:  Regular rate and rhythm, S1/S2, without murmur, rub or gallop.  There was no pedal edema.   GI:  abdomen was soft, flat, nontender, nondistended, without organomegaly.   Musculoskeletal:  no spinal tenderness of palpation of vertebral spine.   Skin exam was without echymosis, petichae.   Neuro exam was nonfocal. Patient was alert and oriented.  Attention was good.   Language was appropriate.  Mood was normal without depression.  Speech was not pressured.  Thought content was not tangential.     Lab Results  Component Value Date   WBC 14.4 (H) 07/23/2021   HGB 8.6 (L) 07/23/2021   HCT 26.2 (L) 07/23/2021   PLT 371 07/23/2021   GLUCOSE 144 (H) 07/23/2021   CHOL 116 11/22/2019   TRIG 140 05/18/2021   HDL 42 11/22/2019   LDLCALC 57 11/22/2019   ALT 19 05/20/2021   AST 17 05/20/2021   NA 135 07/23/2021   K 4.3 07/23/2021   CL 103 07/23/2021  CREATININE 1.38 (H) 07/23/2021   BUN 22 (H) 07/23/2021   CO2 25 07/23/2021    DG Chest 2 View  Result Date: 07/22/2021 CLINICAL DATA:  chest pain EXAM: CHEST - 2 VIEW COMPARISON:  None. FINDINGS: The cardiomediastinal silhouette is within normal limits. No pleural effusion. No pneumothorax. No lobar consolidation. Minimal left base subsegmental atelectasis. No acute osseous abnormality. IMPRESSION: Minimal left lung base subsegmental atelectasis. No other acute abnormality in the chest. Electronically Signed   By: Albin Felling M.D.   On: 07/22/2021 11:05   DG Chest 2 View  Result Date: 06/28/2021 CLINICAL DATA:  Right-sided chest  pain and right arm pain. EXAM: CHEST - 2 VIEW COMPARISON:  05/18/2021 FINDINGS: The heart size and mediastinal contours are within normal limits. Both lungs are clear. The visualized skeletal structures are unremarkable. IMPRESSION: No active cardiopulmonary disease. Electronically Signed   By: Franchot Gallo M.D.   On: 06/28/2021 09:41   CT Chest Wo Contrast  Result Date: 07/22/2021 CLINICAL DATA:  Chest pain EXAM: CT CHEST WITHOUT CONTRAST TECHNIQUE: Multidetector CT imaging of the chest was performed following the standard protocol without IV contrast. COMPARISON:  None. FINDINGS: Cardiovascular: The heart is within normal limits in size. No pericardial effusion the aorta is normal in caliber. Minimal scattered atherosclerotic calcifications. Age advanced coronary artery calcifications. Mediastinum/Nodes: Small scattered mediastinal and hilar lymph nodes but no mass or overt adenopathy. Borderline enlarged subcarinal node measuring 12 mm could be related to the thoracic spine process. The esophagus is grossly normal. Lungs/Pleura: Areas of streaky bibasilar atelectasis. No focal airspace consolidation/pneumonia. No worrisome pulmonary lesions. No pleural effusions or pleural lesions. Upper Abdomen: No significant upper abdominal findings. Musculoskeletal: No chest wall mass, supraclavicular or axillary adenopathy. There are several small bilateral thyroid nodules. None of these measures greater than 12 mm. Not clinically significant; no follow-up imaging recommended (ref: J Am Coll Radiol. 2015 Feb;12(2): 143-50). There are lytic destructive bone lesions involving the T5 and T6 vertebral bodies with a mild pathologic compression fracture of T6. Paraspinal hematoma or tumor noted at both these levels. No definite findings for spinal canal compromise. Mild lucency involving the sternum could also reflect lytic bone disease. I do not see any obvious rib lesions or pathologic rib fractures. IMPRESSION: 1. Lytic  bone disease involving T5 and T6 and possibly the sternum. There is a pathologic fracture of T6 with paraspinal hematoma or tumor. This could represent metastatic bone disease or possibly myeloma. 2. No obvious primary neoplasm involving the chest or upper abdomen. PET-CT may be helpful for further evaluation and extent of the bone disease and possibly detecting a primary neoplasm. 3. Thoracic spine MRI without and with contrast may be helpful for further evaluation of the thoracic disease. I do not see any any obvious spinal canal compromise. Aortic Atherosclerosis (ICD10-I70.0). Electronically Signed   By: Marijo Sanes M.D.   On: 07/22/2021 16:51   NM Pulmonary Perfusion  Result Date: 07/22/2021 CLINICAL DATA:  PE suspected, low/intermediate probability positive D-dimer. EXAM: NUCLEAR MEDICINE PERFUSION LUNG SCAN TECHNIQUE: Perfusion images were obtained in multiple projections after intravenous injection of radiopharmaceutical. Ventilation scans intentionally deferred if perfusion scan and chest x-ray adequate for interpretation during COVID 19 epidemic. RADIOPHARMACEUTICALS:  4.4 mCi Tc-56mMAA IV COMPARISON:  None. FINDINGS: Normal perfusion.  No focal defects. IMPRESSION: No evidence for PE. Electronically Signed   By: CSan MorelleM.D.   On: 07/22/2021 16:26     DG Chest 2 View  Result Date: 07/22/2021  CLINICAL DATA:  chest pain EXAM: CHEST - 2 VIEW COMPARISON:  None. FINDINGS: The cardiomediastinal silhouette is within normal limits. No pleural effusion. No pneumothorax. No lobar consolidation. Minimal left base subsegmental atelectasis. No acute osseous abnormality. IMPRESSION: Minimal left lung base subsegmental atelectasis. No other acute abnormality in the chest. Electronically Signed   By: Albin Felling M.D.   On: 07/22/2021 11:05   DG Chest 2 View  Result Date: 06/28/2021 CLINICAL DATA:  Right-sided chest pain and right arm pain. EXAM: CHEST - 2 VIEW COMPARISON:  05/18/2021  FINDINGS: The heart size and mediastinal contours are within normal limits. Both lungs are clear. The visualized skeletal structures are unremarkable. IMPRESSION: No active cardiopulmonary disease. Electronically Signed   By: Franchot Gallo M.D.   On: 06/28/2021 09:41   CT Chest Wo Contrast  Result Date: 07/22/2021 CLINICAL DATA:  Chest pain EXAM: CT CHEST WITHOUT CONTRAST TECHNIQUE: Multidetector CT imaging of the chest was performed following the standard protocol without IV contrast. COMPARISON:  None. FINDINGS: Cardiovascular: The heart is within normal limits in size. No pericardial effusion the aorta is normal in caliber. Minimal scattered atherosclerotic calcifications. Age advanced coronary artery calcifications. Mediastinum/Nodes: Small scattered mediastinal and hilar lymph nodes but no mass or overt adenopathy. Borderline enlarged subcarinal node measuring 12 mm could be related to the thoracic spine process. The esophagus is grossly normal. Lungs/Pleura: Areas of streaky bibasilar atelectasis. No focal airspace consolidation/pneumonia. No worrisome pulmonary lesions. No pleural effusions or pleural lesions. Upper Abdomen: No significant upper abdominal findings. Musculoskeletal: No chest wall mass, supraclavicular or axillary adenopathy. There are several small bilateral thyroid nodules. None of these measures greater than 12 mm. Not clinically significant; no follow-up imaging recommended (ref: J Am Coll Radiol. 2015 Feb;12(2): 143-50). There are lytic destructive bone lesions involving the T5 and T6 vertebral bodies with a mild pathologic compression fracture of T6. Paraspinal hematoma or tumor noted at both these levels. No definite findings for spinal canal compromise. Mild lucency involving the sternum could also reflect lytic bone disease. I do not see any obvious rib lesions or pathologic rib fractures. IMPRESSION: 1. 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 There is a  pathologic fracture of T6 with paraspinal hematoma or tumor. This could represent metastatic bone disease or possibly myeloma. 2. No obvious primary neoplasm involving the chest or upper abdomen. PET-CT may be helpful for further evaluation and extent of the bone disease and possibly detecting a primary neoplasm. 3. Thoracic spine MRI without and with contrast may be helpful for further evaluation of the thoracic disease. I do not see any any obvious spinal canal compromise. Aortic Atherosclerosis (ICD10-I70.0). Electronically Signed   By: Marijo Sanes M.D.   On: 07/22/2021 16:51   NM Pulmonary Perfusion  Result Date: 07/22/2021 CLINICAL DATA:  PE suspected, low/intermediate probability positive D-dimer. EXAM: NUCLEAR MEDICINE PERFUSION LUNG SCAN TECHNIQUE: Perfusion images were obtained in multiple projections after intravenous injection of radiopharmaceutical. Ventilation scans intentionally deferred if perfusion scan and chest x-ray adequate for interpretation during COVID 19 epidemic. RADIOPHARMACEUTICALS:  4.4 mCi Tc-42mMAA IV COMPARISON:  None. FINDINGS: Normal perfusion.  No focal defects. IMPRESSION: No evidence for PE. Electronically Signed   By: CSan MorelleM.D.   On: 07/22/2021 16:26    Assessment and Plan:  1.  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  Pathologic fracture of T6 3.  Normocytic anemia 4.  Leukocytosis 5.  AKI on CKD, improved 6.  Diabetes mellitus with retinopathy, neuropathy, and microalbuminuria 7.  Hypertension 8.  Hyperlipidemia  -Discussed CT scan findings and lab results with the patient today.  Findings concerning for underlying malignancy such as myeloma versus prostate cancer versus other.  His PSA was normal making prostate cancer less likely.  He has no other abnormal findings on CT scan of the chest.  Await light chains, UPEP, myeloma panel.  May also consider additional imaging such as CT of the abdomen/pelvis, MRI, and/or bone survey.   Final recommendations per Dr. Burr Medico. -The patient's hemoglobin improved following transfusion.  His ferritin, folate, and B12 levels are all normal.  Transfuse for hemoglobin less than 7.5.  Thank you for this referral.   Mikey Bussing, DNP, AGPCNP-BC, AOCNP

## 2021-07-23 NOTE — Progress Notes (Signed)
Spoke with patient's wife on the phone and updated her on his status.

## 2021-07-24 ENCOUNTER — Inpatient Hospital Stay (HOSPITAL_COMMUNITY): Payer: 59

## 2021-07-24 ENCOUNTER — Encounter (HOSPITAL_COMMUNITY): Payer: Self-pay | Admitting: Internal Medicine

## 2021-07-24 DIAGNOSIS — E1142 Type 2 diabetes mellitus with diabetic polyneuropathy: Secondary | ICD-10-CM

## 2021-07-24 DIAGNOSIS — M899 Disorder of bone, unspecified: Secondary | ICD-10-CM | POA: Diagnosis not present

## 2021-07-24 DIAGNOSIS — D649 Anemia, unspecified: Secondary | ICD-10-CM | POA: Diagnosis not present

## 2021-07-24 DIAGNOSIS — L97529 Non-pressure chronic ulcer of other part of left foot with unspecified severity: Secondary | ICD-10-CM | POA: Diagnosis not present

## 2021-07-24 DIAGNOSIS — E11621 Type 2 diabetes mellitus with foot ulcer: Secondary | ICD-10-CM

## 2021-07-24 DIAGNOSIS — K5900 Constipation, unspecified: Secondary | ICD-10-CM | POA: Diagnosis not present

## 2021-07-24 DIAGNOSIS — B9561 Methicillin susceptible Staphylococcus aureus infection as the cause of diseases classified elsewhere: Secondary | ICD-10-CM

## 2021-07-24 DIAGNOSIS — N179 Acute kidney failure, unspecified: Secondary | ICD-10-CM | POA: Diagnosis not present

## 2021-07-24 DIAGNOSIS — R7881 Bacteremia: Secondary | ICD-10-CM | POA: Diagnosis not present

## 2021-07-24 LAB — CBC WITH DIFFERENTIAL/PLATELET
Abs Immature Granulocytes: 0.05 10*3/uL (ref 0.00–0.07)
Basophils Absolute: 0 10*3/uL (ref 0.0–0.1)
Basophils Relative: 0 %
Eosinophils Absolute: 0.2 10*3/uL (ref 0.0–0.5)
Eosinophils Relative: 1 %
HCT: 22.1 % — ABNORMAL LOW (ref 39.0–52.0)
Hemoglobin: 7.2 g/dL — ABNORMAL LOW (ref 13.0–17.0)
Immature Granulocytes: 0 %
Lymphocytes Relative: 13 %
Lymphs Abs: 1.7 10*3/uL (ref 0.7–4.0)
MCH: 27.5 pg (ref 26.0–34.0)
MCHC: 32.6 g/dL (ref 30.0–36.0)
MCV: 84.4 fL (ref 80.0–100.0)
Monocytes Absolute: 1.8 10*3/uL — ABNORMAL HIGH (ref 0.1–1.0)
Monocytes Relative: 14 %
Neutro Abs: 9.2 10*3/uL — ABNORMAL HIGH (ref 1.7–7.7)
Neutrophils Relative %: 72 %
Platelets: 332 10*3/uL (ref 150–400)
RBC: 2.62 MIL/uL — ABNORMAL LOW (ref 4.22–5.81)
RDW: 13.3 % (ref 11.5–15.5)
WBC: 13 10*3/uL — ABNORMAL HIGH (ref 4.0–10.5)
nRBC: 0 % (ref 0.0–0.2)

## 2021-07-24 LAB — ECHOCARDIOGRAM COMPLETE
Height: 71 in
S' Lateral: 2.15 cm
Weight: 3100.8 oz

## 2021-07-24 LAB — HEMOGLOBIN AND HEMATOCRIT, BLOOD
HCT: 23 % — ABNORMAL LOW (ref 39.0–52.0)
Hemoglobin: 7.7 g/dL — ABNORMAL LOW (ref 13.0–17.0)

## 2021-07-24 LAB — BASIC METABOLIC PANEL
Anion gap: 6 (ref 5–15)
BUN: 26 mg/dL — ABNORMAL HIGH (ref 6–20)
CO2: 24 mmol/L (ref 22–32)
Calcium: 8.4 mg/dL — ABNORMAL LOW (ref 8.9–10.3)
Chloride: 101 mmol/L (ref 98–111)
Creatinine, Ser: 2.15 mg/dL — ABNORMAL HIGH (ref 0.61–1.24)
GFR, Estimated: 37 mL/min — ABNORMAL LOW (ref >60)
Glucose, Bld: 133 mg/dL — ABNORMAL HIGH (ref 70–99)
Potassium: 4.3 mmol/L (ref 3.5–5.1)
Sodium: 131 mmol/L — ABNORMAL LOW (ref 135–145)

## 2021-07-24 LAB — KAPPA/LAMBDA LIGHT CHAINS
Kappa free light chain: 71.2 mg/L — ABNORMAL HIGH (ref 3.3–19.4)
Kappa, lambda light chain ratio: 2.09 — ABNORMAL HIGH (ref 0.26–1.65)
Lambda free light chains: 34 mg/L — ABNORMAL HIGH (ref 5.7–26.3)

## 2021-07-24 LAB — GLUCOSE, CAPILLARY
Glucose-Capillary: 133 mg/dL — ABNORMAL HIGH (ref 70–99)
Glucose-Capillary: 123 mg/dL — ABNORMAL HIGH (ref 70–99)
Glucose-Capillary: 208 mg/dL — ABNORMAL HIGH (ref 70–99)
Glucose-Capillary: 189 mg/dL — ABNORMAL HIGH (ref 70–99)

## 2021-07-24 LAB — MAGNESIUM: Magnesium: 1.8 mg/dL (ref 1.7–2.4)

## 2021-07-24 MED ORDER — MUPIROCIN 2 % EX OINT
1.0000 "application " | TOPICAL_OINTMENT | Freq: Two times a day (BID) | CUTANEOUS | Status: DC
  Administered 2021-07-24 – 2021-08-02 (×17): 1 via TOPICAL
  Filled 2021-07-24 (×4): qty 22

## 2021-07-24 MED ORDER — BISACODYL 10 MG RE SUPP
10.0000 mg | Freq: Every day | RECTAL | Status: AC
  Administered 2021-07-25 – 2021-07-27 (×2): 10 mg via RECTAL
  Filled 2021-07-24 (×2): qty 1

## 2021-07-24 MED ORDER — LACTULOSE 10 GM/15ML PO SOLN
20.0000 g | Freq: Two times a day (BID) | ORAL | Status: DC
  Administered 2021-07-24 – 2021-08-03 (×18): 20 g via ORAL
  Filled 2021-07-24 (×20): qty 30

## 2021-07-24 NOTE — Progress Notes (Signed)
PROGRESS NOTE    Jose [name]  BJS:[id number] DOB: 1976/08/26 DOA: 07/22/2021 PCP: [name], NP    Chief Complaint  Patient presents with   Chest Pain    Brief Narrative:  Patient 45 year old gentleman history of type 2 diabetes with retinopathy, neuropathy, microalbuminemia, hypertension, hyperlipidemia, chronic kidney disease stage III (baseline 1.1-1.2), COVID-19 infection July 2022 presented to the ED with complaints of chest pain radiating to bilateral shoulder blades x6 weeks.  Seen by PCP x-ray done showed abnormal curvature of the spine MRI ordered which was scheduled for October however due to worsening symptoms presented to the ED.  Patient noted to be afebrile.  Systolic blood pressures in the 160s.  Patient with a leukocytosis, hemoglobin at 7.5, D-dimer slightly elevated with VQ scan negative for PE.  CT chest without contrast with lytic bone lesions involving T5 and T6 and the sternum, concern for pathologic fracture of T6 with paraspinal hematoma or tumor could represent metastatic bone disease or possibly myeloma.  Patient pancultured with blood cultures 3/4 bottles positive for MSSA.  Case discussed with oncology who will formally consulted and recommended a multiple myeloma panel and urine light chains and LDH level which were ordered.  Patient placed on gentle hydration.  Patient placed on pain management.  Patient admitted for further evaluation and work-up.   Assessment & Plan:   Principal Problem:   Lytic bone lesions on xray Active Problems:   Type 2 diabetes mellitus with diabetic polyneuropathy, without long-term current use of insulin (HCC)   Hyperlipidemia   GAD (generalized anxiety disorder)   AKI (acute kidney injury) ([place])   Anemia   MSSA bacteremia   Constipation  1 lytic bone lesion/disease-highly suspicious for malignancy versus myeloma -Patient presented with 6-week history of chest pain.  Patient with recently diagnosed chronic kidney disease  in March 2022 per patient. -CT chest with lytic bone disease involving T5 and T6 and possibly the sternum, pathologic fracture of T6 with paraspinal hematoma or tumor, findings suggestive of metastatic bone disease or possibly myeloma. -Radiology recommended MRI of the T and L-spine with and without contrast which was ordered after discussions with Dr. Annamaria Boots, oncology. -CT abdomen and pelvis also ordered to follow-up on any further lesions. -Multiple myeloma panel, urine light chains and LDH level ordered. -Oncology has assessed patient, following and patient will likely need bone marrow biopsy to be done on Monday July 27, 2021. -IR consultation and evaluation for possible kyphoplasty. -Will also need a bone marrow biopsy per hematology/oncology recommendations which likely will be done on Monday, 07/27/2021. -Continue current regimen of OxyContin 50 mg twice daily, oxycodone 5 mg every 4 hours as needed breakthrough pain, IV Dilaudid for severe breakthrough pain. -Continue Neurontin. -Oncology following appreciate input and recommendations. -Palliative care consultation pending for symptom management.  2.  MSSA bacteremia -Patient with MSSA bacteremia noted on blood cultures.   -Patient with a left great toe wound with pus noted and necrotic area likely source of bacteremia.   -Check a 2D echo.   -We will need repeat blood cultures.   -Continue IV Ancef.   -ID consultation pending.    3.  AKI on chronic kidney disease stage IIIa -Baseline creatinine 1.3 from July 2022. -Creatinine elevated on admission at 1.99. -Renal function fluctuating creatinine currently at 2.15 today. -Continue to hold diuretics and ARB. -Gentle hydration. -Follow.  4.  Diabetes mellitus type 2 -Hemoglobin A1c of 7.29 May 2021. -Patient with long-term complication of retinopathy, neuropathy, microalbuminemia. -Hold home  oral hypoglycemic agents. -CBG at 133 this morning.   -Continue Neurontin.   -SSI.    5.  Hypertension Patient with significantly elevated blood pressure likely pain component on admission. -Continue to hold ARB and Lasix due to AKI. -Continue verapamil, hydralazine 25 mg p.o. every 8 hours and uptitrate as needed for better blood pressure control. -IV hydralazine as needed systolic blood pressure > 180. -Pain management.  6.  Hyperlipidemia -Statin.  7.  Acute on chronic anemia/low vitamin B12 levels -Hemoglobin noted at baseline to be approximately 9 in July. -Patient presented she had a hemoglobin as low as 7.5 likely secondary to chronic kidney disease versus malignancy. -Patient with no overt bleeding. -Hemoglobin currently at 7.2 this morning -May need IV iron during this hospitalization however will defer to oncology. -Vitamin B12 levels at 252 and as such patient placed on vitamin B12 supplementation of subcutaneous during the hospitalization and transition to oral vitamin B12 on discharge.   8.  Constipation -Patient with no bowel movement. -Continue Senokot-S twice daily, daily Dulcolax suppositories. -Discontinue MiraLAX and placed on lactulose twice daily.  9.  Left foot ulcer/wound, poa -Patient seen by wound care RN who assessed wound and noted pain content pus that pulled out from 2 different areas of the toe with a necrotic area noted. -Likely etiology of patient's MSSA. -Consult with podiatry. -Continue current wound care and dressing changes as recommended per wound care team.    DVT prophylaxis: Continue Heparin Code Status: Full Family Communication: Updated patient and wife at bedside. Disposition:   Status is: Inpatient  Remains inpatient appropriate because:Inpatient level of care appropriate due to severity of illness  Dispo: The patient is from: Home              Anticipated d/c is to: Home              Patient currently is not medically stable to d/c.   Difficult to place patient No       Consultants:  Oncology: Dr. Burr Medico  07/23/2021 ID pending Podiatry pending  Procedures:  CT chest 07/22/2021 Chest x-ray 07/22/2021 VQ scan 07/22/2021   Antimicrobials: IV Ancef 07/23/2021>>>>>>   Subjective: Patient sitting up in bed.  Still with complaints of chest and back pain with some dyspnea.  Patient states overall some improvement with chest pain from admission however still in significant pain.  No abdominal pain.  No bowel movement yet.  Objective: Vitals:   07/23/21 1641 07/23/21 2234 07/24/21 0414 07/24/21 0500  BP: 99/66 (!) 156/94 139/81   Pulse:  (!) 110 (!) 108   Resp:  20 20   Temp:  99.2 F (37.3 C) 98.6 F (37 C)   TempSrc:  Oral Oral   SpO2:  97% 90%   Weight:    87.9 kg  Height:  $Remove'5\' 11"'tqgxUDW$  (1.803 m)      Intake/Output Summary (Last 24 hours) at 07/24/2021 1158 Last data filed at 07/24/2021 0940 Gross per 24 hour  Intake 2612.44 ml  Output --  Net 2612.44 ml    Filed Weights   07/24/21 0500  Weight: 87.9 kg    Examination:  General exam: : NAD Respiratory system: CTA B anterior lung fields.  No wheezes, no rhonchi.  Speaking in full sentences.  Normal respiratory effort. Cardiovascular system: Regular rate and rhythm no murmurs rubs or gallops.  No JVD.  No lower extremity edema.  Gastrointestinal system: Abdomen soft, nontender, nondistended, positive bowel sounds.  No rebound.  No guarding.  Central nervous system: Alert and oriented. No focal neurological deficits. Extremities: Left great toe bandaged.  Skin: No rashes, lesions or ulcers Psychiatry: Judgement and insight appear normal. Mood & affect appropriate.  Data Reviewed: I have personally reviewed following labs and imaging studies  CBC: Recent Labs  Lab 07/22/21 1051 07/23/21 0508 07/24/21 0550  WBC 14.3* 14.4* 13.0*  NEUTROABS  --   --  9.2*  HGB 7.5* 8.6* 7.2*  HCT 23.0* 26.2* 22.1*  MCV 86.1 81.9 84.4  PLT 405* 371 332     Basic Metabolic Panel: Recent Labs  Lab 07/22/21 1051 07/23/21 0508  07/24/21 0550  NA 134* 135 131*  K 4.8 4.3 4.3  CL 98 103 101  CO2 _0 GLUCOSE 184* 144* 133*  BUN 25* 22* 26*  CREATININE 1.99* 1.38* 2.15*  CALCIUM 9.4 9.2 8.4*  MG  --   --  1.8     GFR: Estimated Creatinine Clearance: 45.2 mL/min (A) (by C-G formula based on SCr of 2.15 mg/dL (H)).  Liver Function Tests: No results for input(s): AST, ALT, ALKPHOS, BILITOT, PROT, ALBUMIN in the last 168 hours.  CBG: Recent Labs  Lab 07/23/21 0754 07/23/21 1246 07/23/21 1626 07/23/21 2230 07/24/21 0824  GLUCAP 124* 258* 148* 149* 133*      Recent Results (from the past 240 hour(s))  Culture, blood (routine x 2)     Status: Abnormal (Preliminary result)   Collection Time: 07/22/21  2:11 PM   Specimen: BLOOD  Result Value Ref Range Status   Specimen Description   Final    BLOOD RIGHT ANTECUBITAL Performed at [facility], [place] [address], [place], [place] [address]    Special Requests   Final    BOTTLES DRAWN AEROBIC AND ANAEROBIC Blood Culture results may not be optimal due to an excessive volume of blood received in culture bottles Performed at [facility] [address] [place], [place] [address]    Culture  Setup Time   Final    GRAM POSITIVE COCCI IN CLUSTERS IN BOTH AEROBIC AND ANAEROBIC BOTTLES CRITICAL RESULT CALLED TO, READ BACK BY AND VERIFIED WITH: PHARMD M.SWAYME AT 8657 ON 07/23/2021 BY T.SAAD.    Culture (A)  Final    STAPHYLOCOCCUS AUREUS SUSCEPTIBILITIES TO FOLLOW Performed at [facility], [place] [address] [place], [place] [address]    Report Status PENDING  Incomplete  Blood Culture ID Panel (Reflexed)     Status: Abnormal   Collection Time: 07/22/21  2:11 PM  Result Value Ref Range Status   Enterococcus faecalis NOT DETECTED NOT DETECTED Final   Enterococcus Faecium NOT DETECTED NOT DETECTED Final   Listeria monocytogenes NOT DETECTED NOT DETECTED Final   Staphylococcus species DETECTED (A) NOT  DETECTED Final    Comment: CRITICAL RESULT CALLED TO, READ BACK BY AND VERIFIED WITH: PHARMD M.SWAYME AT 1547 ON 07/23/2021 BY T.SAAD.    Staphylococcus aureus (BCID) DETECTED (A) NOT DETECTED Final    Comment: CRITICAL RESULT CALLED TO, READ BACK BY AND VERIFIED WITH: PHARMD M.SWAYME AT 1547 ON 07/23/2021 BY T.SAAD.    Staphylococcus epidermidis NOT DETECTED NOT DETECTED Final   Staphylococcus lugdunensis NOT DETECTED NOT DETECTED Final   Streptococcus species NOT DETECTED NOT DETECTED Final   Streptococcus agalactiae NOT DETECTED NOT DETECTED Final   Streptococcus pneumoniae NOT DETECTED NOT DETECTED Final   Streptococcus pyogenes NOT DETECTED NOT DETECTED Final   A.calcoaceticus-baumannii NOT DETECTED NOT DETECTED Final   Bacteroides fragilis  NOT DETECTED NOT DETECTED Final   Enterobacterales NOT DETECTED NOT DETECTED Final   Enterobacter cloacae complex NOT DETECTED NOT DETECTED Final   Escherichia coli NOT DETECTED NOT DETECTED Final   Klebsiella aerogenes NOT DETECTED NOT DETECTED Final   Klebsiella oxytoca NOT DETECTED NOT DETECTED Final   Klebsiella pneumoniae NOT DETECTED NOT DETECTED Final   Proteus species NOT DETECTED NOT DETECTED Final   Salmonella species NOT DETECTED NOT DETECTED Final   Serratia marcescens NOT DETECTED NOT DETECTED Final   Haemophilus influenzae NOT DETECTED NOT DETECTED Final   Neisseria meningitidis NOT DETECTED NOT DETECTED Final   Pseudomonas aeruginosa NOT DETECTED NOT DETECTED Final   Stenotrophomonas maltophilia NOT DETECTED NOT DETECTED Final   Candida albicans NOT DETECTED NOT DETECTED Final   Candida auris NOT DETECTED NOT DETECTED Final   Candida glabrata NOT DETECTED NOT DETECTED Final   Candida krusei NOT DETECTED NOT DETECTED Final   Candida parapsilosis NOT DETECTED NOT DETECTED Final   Candida tropicalis NOT DETECTED NOT DETECTED Final   Cryptococcus neoformans/gattii NOT DETECTED NOT DETECTED Final   Meth resistant mecA/C and MREJ  NOT DETECTED NOT DETECTED Final    Comment: Performed at [facility], [address], [place], [place] [address]  Culture, blood (routine x 2)     Status: None (Preliminary result)   Collection Time: 07/22/21  3:13 PM   Specimen: BLOOD  Result Value Ref Range Status   Specimen Description   Final    BLOOD LEFT ANTECUBITAL Performed at [facility] [address] [place], [place] [address]    Special Requests   Final    BOTTLES DRAWN AEROBIC ONLY Blood Culture results may not be optimal due to an inadequate volume of blood received in culture bottles Performed at [facility] [address] [place], [place] [address]    Culture   Final    NO GROWTH 2 DAYS Performed at [facility] [address], [place], [place] [address]    Report Status PENDING  Incomplete  SARS CORONAVIRUS 2 (TAT 6-24 HRS) Nasopharyngeal     Status: None   Collection Time: 07/22/21  6:07 PM   Specimen: Nasopharyngeal  Result Value Ref Range Status   SARS Coronavirus 2 NEGATIVE NEGATIVE Final    Comment: (NOTE) SARS-CoV-2 target nucleic acids are NOT DETECTED.  The SARS-CoV-2 RNA is generally detectable in upper and lower respiratory specimens during the acute phase of infection. Negative results do not preclude SARS-CoV-2 infection, do not rule out co-infections with other pathogens, and should not be used as the sole basis for treatment or other patient management decisions. Negative results must be combined with clinical observations, patient history, and epidemiological information. The expected result is Negative.  Fact Sheet for Patients: SugarRoll.be  Fact Sheet for Healthcare Providers: https://www.woods-mathews.com/  This test is not yet approved or cleared by the [place] FDA and  has been authorized for detection and/or diagnosis of SARS-CoV-2 by FDA under an Emergency Use Authorization (EUA).  This EUA will remain  in effect (meaning this test can be used) for the duration of the COVID-19 declaration under Se ction 564(b)(1) of the Act, 21 U.S.C. section 360bbb-3(b)(1), unless the authorization is terminated or revoked sooner.  Performed at [facility], [place] [address], [place], [place] [address]           Radiology Studies: CT ABDOMEN PELVIS WO CONTRAST  Result Date: 07/23/2021 CLINICAL DATA:  Cancer of  unknown primary, lytic thoracic spine lesions EXAM: CT ABDOMEN AND PELVIS WITHOUT CONTRAST TECHNIQUE: Multidetector CT imaging of the abdomen and pelvis was performed following the standard protocol without IV contrast. Unenhanced CT was performed per clinician order. Lack of IV contrast limits sensitivity and specificity, especially for evaluation of abdominal/pelvic solid viscera. COMPARISON:  07/22/2021 FINDINGS: Lower chest: Hypoventilatory changes at the dependent lower lobes. No acute pleural or parenchymal lung disease. Hepatobiliary: No gross liver abnormalities on this unenhanced exam. Gallbladder is moderately distended without cholelithiasis or cholecystitis. Pancreas: Unremarkable. No pancreatic ductal dilatation or surrounding inflammatory changes. Spleen: Normal in size without focal abnormality. Adrenals/Urinary Tract: No urinary tract calculi or obstructive uropathy. Excreted contrast within the bladder consistent with preceding MRI. The adrenals are unremarkable. Stomach/Bowel: No bowel obstruction or ileus. Moderate retained stool throughout the Jose. The appendix is surgically absent. No bowel wall thickening or inflammatory change. Vascular/Lymphatic: Aortic atherosclerosis. No enlarged abdominal or pelvic lymph nodes. Reproductive: Prostate is unremarkable. Other: No free fluid or free gas. Small fat containing bilateral inguinal hernias. No bowel herniation. Musculoskeletal: There are no acute or destructive bony lesions. Reconstructed images demonstrate no  additional findings. IMPRESSION: 1. No acute intra-abdominal or intrapelvic process. 2. Moderate fecal retention consistent with constipation. 3.  Aortic Atherosclerosis (ICD10-I70.0). Electronically Signed   By: Randa Ngo M.D.   On: 07/23/2021 22:26   CT Chest Wo Contrast  Result Date: 07/22/2021 CLINICAL DATA:  Chest pain EXAM: CT CHEST WITHOUT CONTRAST TECHNIQUE: Multidetector CT imaging of the chest was performed following the standard protocol without IV contrast. COMPARISON:  None. FINDINGS: Cardiovascular: The heart is within normal limits in size. No pericardial effusion the aorta is normal in caliber. Minimal scattered atherosclerotic calcifications. Age advanced coronary artery calcifications. Mediastinum/Nodes: Small scattered mediastinal and hilar lymph nodes but no mass or overt adenopathy. Borderline enlarged subcarinal node measuring 12 mm could be related to the thoracic spine process. The esophagus is grossly normal. Lungs/Pleura: Areas of streaky bibasilar atelectasis. No focal airspace consolidation/pneumonia. No worrisome pulmonary lesions. No pleural effusions or pleural lesions. Upper Abdomen: No significant upper abdominal findings. Musculoskeletal: No chest wall mass, supraclavicular or axillary adenopathy. There are several small bilateral thyroid nodules. None of these measures greater than 12 mm. Not clinically significant; no follow-up imaging recommended (ref: J Am Coll Radiol. 2015 Feb;12(2): 143-50). There are lytic destructive bone lesions involving the T5 and T6 vertebral bodies with a mild pathologic compression fracture of T6. Paraspinal hematoma or tumor noted at both these levels. No definite findings for spinal canal compromise. Mild lucency involving the sternum could also reflect lytic bone disease. I do not see any obvious rib lesions or pathologic rib fractures. IMPRESSION: 1. 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 There is a pathologic  fracture of T6 with paraspinal hematoma or tumor. This could represent metastatic bone disease or possibly myeloma. 2. No obvious primary neoplasm involving the chest or upper abdomen. PET-CT may be helpful for further evaluation and extent of the bone disease and possibly detecting a primary neoplasm. 3. Thoracic spine MRI without and with contrast may be helpful for further evaluation of the thoracic disease. I do not see any any obvious spinal canal compromise. Aortic Atherosclerosis (ICD10-I70.0). Electronically Signed   By: Marijo Sanes M.D.   On: 07/22/2021 16:51   MR THORACIC SPINE W WO CONTRAST  Result Date: 07/23/2021 CLINICAL DATA:  Bone lesion, T-spine, malignancy suspected; Bone lesion, L/S-spine, malignancy suspected EXAM: MRI THORACIC AND LUMBAR  SPINE WITHOUT AND WITH CONTRAST TECHNIQUE: Multiplanar and multiecho pulse sequences of the thoracic and lumbar spine were obtained without and with intravenous contrast. CONTRAST:  74mL GADAVIST GADOBUTROL 1 MMOL/ML IV SOLN COMPARISON:  None. FINDINGS: MRI THORACIC SPINE FINDINGS Alignment:  Physiologic. Vertebrae: There is abnormal bone marrow signal and contrast enhancement at T4 and T5. There is mild edema of the disc space. There is a minimally depressed fracture of the superior T5 endplate. Cord:  Normal signal and morphology. Paraspinal and other soft tissues: There is phlegmon like contrast enhancement of the prevertebral soft tissues at the T4 and T5 levels. No epidural abscess. Disc levels: There is no spinal canal or neural foraminal stenosis. MRI LUMBAR SPINE FINDINGS Segmentation:  Standard. Alignment:  Physiologic. Vertebrae:  No fracture, evidence of discitis, or bone lesion. Conus medullaris: Extends to the L1 level and appears normal. Paraspinal and other soft tissues: Negative. Disc levels: No spinal canal or neural foraminal stenosis. IMPRESSION: 1. Abnormal bone marrow signal and contrast enhancement at the T4 and T5 levels with mild  edema of the disc space and prevertebral phlegmon/hematoma. The findings could indicate osseous metastatic disease, multiple myeloma or discitis-osteomyelitis. 2. Minimally depressed compression fracture of T5 without retropulsion. 3. No epidural abscess. 4. No spinal canal or neural foraminal stenosis. Electronically Signed   By: Ulyses Jarred M.D.   On: 07/23/2021 19:55   MR Lumbar Spine W Wo Contrast  Result Date: 07/23/2021 CLINICAL DATA:  Bone lesion, T-spine, malignancy suspected; Bone lesion, L/S-spine, malignancy suspected EXAM: MRI THORACIC AND LUMBAR SPINE WITHOUT AND WITH CONTRAST TECHNIQUE: Multiplanar and multiecho pulse sequences of the thoracic and lumbar spine were obtained without and with intravenous contrast. CONTRAST:  68mL GADAVIST GADOBUTROL 1 MMOL/ML IV SOLN COMPARISON:  None. FINDINGS: MRI THORACIC SPINE FINDINGS Alignment:  Physiologic. Vertebrae: There is abnormal bone marrow signal and contrast enhancement at T4 and T5. There is mild edema of the disc space. There is a minimally depressed fracture of the superior T5 endplate. Cord:  Normal signal and morphology. Paraspinal and other soft tissues: There is phlegmon like contrast enhancement of the prevertebral soft tissues at the T4 and T5 levels. No epidural abscess. Disc levels: There is no spinal canal or neural foraminal stenosis. MRI LUMBAR SPINE FINDINGS Segmentation:  Standard. Alignment:  Physiologic. Vertebrae:  No fracture, evidence of discitis, or bone lesion. Conus medullaris: Extends to the L1 level and appears normal. Paraspinal and other soft tissues: Negative. Disc levels: No spinal canal or neural foraminal stenosis. IMPRESSION: 1. Abnormal bone marrow signal and contrast enhancement at the T4 and T5 levels with mild edema of the disc space and prevertebral phlegmon/hematoma. The findings could indicate osseous metastatic disease, multiple myeloma or discitis-osteomyelitis. 2. Minimally depressed compression fracture of  T5 without retropulsion. 3. No epidural abscess. 4. No spinal canal or neural foraminal stenosis. Electronically Signed   By: Ulyses Jarred M.D.   On: 07/23/2021 19:55   NM Pulmonary Perfusion  Result Date: 07/22/2021 CLINICAL DATA:  PE suspected, low/intermediate probability positive D-dimer. EXAM: NUCLEAR MEDICINE PERFUSION LUNG SCAN TECHNIQUE: Perfusion images were obtained in multiple projections after intravenous injection of radiopharmaceutical. Ventilation scans intentionally deferred if perfusion scan and chest x-ray adequate for interpretation during COVID 19 epidemic. RADIOPHARMACEUTICALS:  4.4 mCi Tc-83m MAA IV COMPARISON:  None. FINDINGS: Normal perfusion.  No focal defects. IMPRESSION: No evidence for PE. Electronically Signed   By: San Morelle M.D.   On: 07/22/2021 16:26        Scheduled  Meds:  atorvastatin  40 mg Oral Daily   bisacodyl  10 mg Rectal Daily   cyanocobalamin  1,000 mcg Subcutaneous Daily   cyclobenzaprine  10 mg Oral BID   feeding supplement  237 mL Oral BID BM   gabapentin  200 mg Oral TID   heparin  5,000 Units Subcutaneous Q8H   hydrALAZINE  25 mg Oral Q8H   insulin aspart  0-5 Units Subcutaneous QHS   insulin aspart  0-9 Units Subcutaneous TID WC   lactulose  20 g Oral BID   multivitamin with minerals  1 tablet Oral Daily   oxyCODONE  15 mg Oral Q12H   senna-docusate  1 tablet Oral BID   traZODone  100 mg Oral QHS   verapamil  120 mg Oral Daily   Continuous Infusions:  sodium chloride 100 mL/hr at 07/24/21 0756    ceFAZolin (ANCEF) IV 200 mL/hr at 07/24/21 0600     LOS: 2 days    Time spent: 40 minutes    Irine Seal, MD [facility]   To contact the attending provider between 7A-7P or the covering provider during after hours 7P-7A, please log into the web site [email or web address] and access using universal [place] password for that web site. If you do not have the password, please call the hospital operator.  07/24/2021,  11:58 AM

## 2021-07-24 NOTE — Progress Notes (Signed)
Echocardiogram 2D Echocardiogram has been performed.  Oneal Deputy Justin Buechner RDCS 07/24/2021, 11:38 AM

## 2021-07-24 NOTE — Progress Notes (Addendum)
HEMATOLOGY-ONCOLOGY PROGRESS NOTE  SUBJECTIVE: Continues to have back and shoulder pain.  Afebrile.  Noted to have MSSA bacteremia.  24-hour urine collection in process.  REVIEW OF SYSTEMS:   Constitutional: Denies fevers, chills  Eyes: Denies blurriness of vision Ears, nose, mouth, throat, and face: Denies mucositis or sore throat Respiratory: Denies cough, dyspnea or wheezes Cardiovascular: Denies palpitation, chest discomfort Gastrointestinal:  Denies nausea, heartburn or change in bowel habits Skin: Denies abnormal skin rashes Lymphatics: Denies new lymphadenopathy or easy bruising Neurological: Has baseline peripheral neuropathy Behavioral/Psych: Mood is stable, no new changes  Extremities: No lower extremity edema All other systems were reviewed with the patient and are negative.  I have reviewed the past medical history, past surgical history, social history and family history with the patient and they are unchanged from previous note.   PHYSICAL EXAMINATION: ECOG PERFORMANCE STATUS: 1 - Symptomatic but completely ambulatory  Vitals:   07/23/21 2234 07/24/21 0414  BP: (!) 156/94 139/81  Pulse: (!) 110 (!) 108  Resp: 20 20  Temp: 99.2 F (37.3 C) 98.6 F (37 C)  SpO2: 97% 90%   Filed Weights   07/24/21 0500  Weight: 87.9 kg    Intake/Output from previous day: 09/29 0701 - 09/30 0700 In: 2612.4 [P.O.:720; I.V.:1689.7; IV Piggyback:202.8] Out: -   GENERAL:alert, no distress and comfortable SKIN: skin color, texture, turgor are normal, no rashes or significant lesions LUNGS: clear to auscultation and percussion with normal breathing effort HEART: regular rate & rhythm and no murmurs and no lower extremity edema ABDOMEN:abdomen soft, non-tender and normal bowel sounds NEURO: alert & oriented x 3 with fluent speech, no focal motor/sensory deficits  LABORATORY DATA:  I have reviewed the data as listed CMP Latest Ref Rng & Units 07/24/2021 07/23/2021 07/22/2021   Glucose 70 - 99 mg/dL 133(H) 144(H) 184(H)  BUN 6 - 20 mg/dL 26(H) 22(H) 25(H)  Creatinine 0.61 - 1.24 mg/dL 2.15(H) 1.38(H) 1.99(H)  Sodium 135 - 145 mmol/L 131(L) 135 134(L)  Potassium 3.5 - 5.1 mmol/L 4.3 4.3 4.8  Chloride 98 - 111 mmol/L 101 103 98  CO2 22 - 32 mmol/L 24 25 27   Calcium 8.9 - 10.3 mg/dL 8.4(L) 9.2 9.4  Total Protein 6.5 - 8.1 g/dL - - -  Total Bilirubin 0.3 - 1.2 mg/dL - - -  Alkaline Phos 38 - 126 U/L - - -  AST 15 - 41 U/L - - -  ALT 0 - 44 U/L - - -    Lab Results  Component Value Date   WBC 13.0 (H) 07/24/2021   HGB 7.2 (L) 07/24/2021   HCT 22.1 (L) 07/24/2021   MCV 84.4 07/24/2021   PLT 332 07/24/2021   NEUTROABS 9.2 (H) 07/24/2021    CT ABDOMEN PELVIS WO CONTRAST  Result Date: 07/23/2021 CLINICAL DATA:  Cancer of unknown primary, lytic thoracic spine lesions EXAM: CT ABDOMEN AND PELVIS WITHOUT CONTRAST TECHNIQUE: Multidetector CT imaging of the abdomen and pelvis was performed following the standard protocol without IV contrast. Unenhanced CT was performed per clinician order. Lack of IV contrast limits sensitivity and specificity, especially for evaluation of abdominal/pelvic solid viscera. COMPARISON:  07/22/2021 FINDINGS: Lower chest: Hypoventilatory changes at the dependent lower lobes. No acute pleural or parenchymal lung disease. Hepatobiliary: No gross liver abnormalities on this unenhanced exam. Gallbladder is moderately distended without cholelithiasis or cholecystitis. Pancreas: Unremarkable. No pancreatic ductal dilatation or surrounding inflammatory changes. Spleen: Normal in size without focal abnormality. Adrenals/Urinary Tract: No urinary tract calculi or  obstructive uropathy. Excreted contrast within the bladder consistent with preceding MRI. The adrenals are unremarkable. Stomach/Bowel: No bowel obstruction or ileus. Moderate retained stool throughout the colon. The appendix is surgically absent. No bowel wall thickening or inflammatory change.  Vascular/Lymphatic: Aortic atherosclerosis. No enlarged abdominal or pelvic lymph nodes. Reproductive: Prostate is unremarkable. Other: No free fluid or free gas. Small fat containing bilateral inguinal hernias. No bowel herniation. Musculoskeletal: There are no acute or destructive bony lesions. Reconstructed images demonstrate no additional findings. IMPRESSION: 1. No acute intra-abdominal or intrapelvic process. 2. Moderate fecal retention consistent with constipation. 3.  Aortic Atherosclerosis (ICD10-I70.0). Electronically Signed   By: Randa Ngo M.D.   On: 07/23/2021 22:26   DG Chest 2 View  Result Date: 07/22/2021 CLINICAL DATA:  chest pain EXAM: CHEST - 2 VIEW COMPARISON:  None. FINDINGS: The cardiomediastinal silhouette is within normal limits. No pleural effusion. No pneumothorax. No lobar consolidation. Minimal left base subsegmental atelectasis. No acute osseous abnormality. IMPRESSION: Minimal left lung base subsegmental atelectasis. No other acute abnormality in the chest. Electronically Signed   By: Albin Felling M.D.   On: 07/22/2021 11:05   DG Chest 2 View  Result Date: 06/28/2021 CLINICAL DATA:  Right-sided chest pain and right arm pain. EXAM: CHEST - 2 VIEW COMPARISON:  05/18/2021 FINDINGS: The heart size and mediastinal contours are within normal limits. Both lungs are clear. The visualized skeletal structures are unremarkable. IMPRESSION: No active cardiopulmonary disease. Electronically Signed   By: Franchot Gallo M.D.   On: 06/28/2021 09:41   CT Chest Wo Contrast  Result Date: 07/22/2021 CLINICAL DATA:  Chest pain EXAM: CT CHEST WITHOUT CONTRAST TECHNIQUE: Multidetector CT imaging of the chest was performed following the standard protocol without IV contrast. COMPARISON:  None. FINDINGS: Cardiovascular: The heart is within normal limits in size. No pericardial effusion the aorta is normal in caliber. Minimal scattered atherosclerotic calcifications. Age advanced coronary artery  calcifications. Mediastinum/Nodes: Small scattered mediastinal and hilar lymph nodes but no mass or overt adenopathy. Borderline enlarged subcarinal node measuring 12 mm could be related to the thoracic spine process. The esophagus is grossly normal. Lungs/Pleura: Areas of streaky bibasilar atelectasis. No focal airspace consolidation/pneumonia. No worrisome pulmonary lesions. No pleural effusions or pleural lesions. Upper Abdomen: No significant upper abdominal findings. Musculoskeletal: No chest wall mass, supraclavicular or axillary adenopathy. There are several small bilateral thyroid nodules. None of these measures greater than 12 mm. Not clinically significant; no follow-up imaging recommended (ref: J Am Coll Radiol. 2015 Feb;12(2): 143-50). There are lytic destructive bone lesions involving the T5 and T6 vertebral bodies with a mild pathologic compression fracture of T6. Paraspinal hematoma or tumor noted at both these levels. No definite findings for spinal canal compromise. Mild lucency involving the sternum could also reflect lytic bone disease. I do not see any obvious rib lesions or pathologic rib fractures. IMPRESSION: 1. 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 There is a pathologic fracture of T6 with paraspinal hematoma or tumor. This could represent metastatic bone disease or possibly myeloma. 2. No obvious primary neoplasm involving the chest or upper abdomen. PET-CT may be helpful for further evaluation and extent of the bone disease and possibly detecting a primary neoplasm. 3. Thoracic spine MRI without and with contrast may be helpful for further evaluation of the thoracic disease. I do not see any any obvious spinal canal compromise. Aortic Atherosclerosis (ICD10-I70.0). Electronically Signed   By: Marijo Sanes M.D.   On: 07/22/2021 16:51  MR THORACIC SPINE W WO CONTRAST  Result Date: 07/23/2021 CLINICAL DATA:  Bone lesion, T-spine, malignancy suspected; Bone  lesion, L/S-spine, malignancy suspected EXAM: MRI THORACIC AND LUMBAR SPINE WITHOUT AND WITH CONTRAST TECHNIQUE: Multiplanar and multiecho pulse sequences of the thoracic and lumbar spine were obtained without and with intravenous contrast. CONTRAST:  48m GADAVIST GADOBUTROL 1 MMOL/ML IV SOLN COMPARISON:  None. FINDINGS: MRI THORACIC SPINE FINDINGS Alignment:  Physiologic. Vertebrae: There is abnormal bone marrow signal and contrast enhancement at T4 and T5. There is mild edema of the disc space. There is a minimally depressed fracture of the superior T5 endplate. Cord:  Normal signal and morphology. Paraspinal and other soft tissues: There is phlegmon like contrast enhancement of the prevertebral soft tissues at the T4 and T5 levels. No epidural abscess. Disc levels: There is no spinal canal or neural foraminal stenosis. MRI LUMBAR SPINE FINDINGS Segmentation:  Standard. Alignment:  Physiologic. Vertebrae:  No fracture, evidence of discitis, or bone lesion. Conus medullaris: Extends to the L1 level and appears normal. Paraspinal and other soft tissues: Negative. Disc levels: No spinal canal or neural foraminal stenosis. IMPRESSION: 1. Abnormal bone marrow signal and contrast enhancement at the T4 and T5 levels with mild edema of the disc space and prevertebral phlegmon/hematoma. The findings could indicate osseous metastatic disease, multiple myeloma or discitis-osteomyelitis. 2. Minimally depressed compression fracture of T5 without retropulsion. 3. No epidural abscess. 4. No spinal canal or neural foraminal stenosis. Electronically Signed   By: KUlyses JarredM.D.   On: 07/23/2021 19:55   MR Lumbar Spine W Wo Contrast  Result Date: 07/23/2021 CLINICAL DATA:  Bone lesion, T-spine, malignancy suspected; Bone lesion, L/S-spine, malignancy suspected EXAM: MRI THORACIC AND LUMBAR SPINE WITHOUT AND WITH CONTRAST TECHNIQUE: Multiplanar and multiecho pulse sequences of the thoracic and lumbar spine were obtained  without and with intravenous contrast. CONTRAST:  174mGADAVIST GADOBUTROL 1 MMOL/ML IV SOLN COMPARISON:  None. FINDINGS: MRI THORACIC SPINE FINDINGS Alignment:  Physiologic. Vertebrae: There is abnormal bone marrow signal and contrast enhancement at T4 and T5. There is mild edema of the disc space. There is a minimally depressed fracture of the superior T5 endplate. Cord:  Normal signal and morphology. Paraspinal and other soft tissues: There is phlegmon like contrast enhancement of the prevertebral soft tissues at the T4 and T5 levels. No epidural abscess. Disc levels: There is no spinal canal or neural foraminal stenosis. MRI LUMBAR SPINE FINDINGS Segmentation:  Standard. Alignment:  Physiologic. Vertebrae:  No fracture, evidence of discitis, or bone lesion. Conus medullaris: Extends to the L1 level and appears normal. Paraspinal and other soft tissues: Negative. Disc levels: No spinal canal or neural foraminal stenosis. IMPRESSION: 1. Abnormal bone marrow signal and contrast enhancement at the T4 and T5 levels with mild edema of the disc space and prevertebral phlegmon/hematoma. The findings could indicate osseous metastatic disease, multiple myeloma or discitis-osteomyelitis. 2. Minimally depressed compression fracture of T5 without retropulsion. 3. No epidural abscess. 4. No spinal canal or neural foraminal stenosis. Electronically Signed   By: KeUlyses Jarred.D.   On: 07/23/2021 19:55   NM Pulmonary Perfusion  Result Date: 07/22/2021 CLINICAL DATA:  PE suspected, low/intermediate probability positive D-dimer. EXAM: NUCLEAR MEDICINE PERFUSION LUNG SCAN TECHNIQUE: Perfusion images were obtained in multiple projections after intravenous injection of radiopharmaceutical. Ventilation scans intentionally deferred if perfusion scan and chest x-ray adequate for interpretation during COVID 19 epidemic. RADIOPHARMACEUTICALS:  4.4 mCi Tc-996mA IV COMPARISON:  None. FINDINGS: Normal perfusion.  No focal  defects.  IMPRESSION: No evidence for PE. Electronically Signed   By: San Morelle M.D.   On: 07/22/2021 16:26    ASSESSMENT AND PLAN: 1.  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  Pathologic fracture of T6 3.  Normocytic anemia 4.  Leukocytosis 5.  AKI on CKD, improved 6.  Diabetes mellitus with retinopathy, neuropathy, and microalbuminuria 7.  Hypertension 8.  Hyperlipidemia  -MRI and CT of the abdomen/pelvis reviewed.  CT did not show obvious source of primary malignancy.  MRI of thoracic and lumbar spine with findings concerning for osseous metastatic disease, myeloma, or discitis-osteomyelitis. -Await additional work-up including light chains, myeloma panel, and UPEP. -Discussed proceeding with bone marrow biopsy.  He agrees to proceed.  I have reached out to my office to determine if they can perform this procedure on Monday 10/3.  I have not heard back.  Therefore, I have entered an order for IR to perform this procedure on 10/3. -We will continue to follow-up and discuss results as they become available.  Future Appointments  Date Time Provider [place]  07/24/2021 12:00 PM [facility]  08/07/2021  2:45 PM Felipa Furnace, DPM [facility]  08/11/2021  8:45 AM Renato Shin, MD [facility] None      LOS: 2 days   Mikey Bussing, DNP, AGPCNP-BC, AOCNP 07/24/21  Addendum  I have seen the patient, examined him. I agree with the assessment and and plan and have edited the notes.   I reviewed his CT abdomen pelvis, MRI of the thoracic and lumbar spine, and discussed the finding with patient and his wife.  Given the limited bone involvement in T4/T5, without other diffuse bone marrow signal on the scan, multiple myeloma is less likely.  Patient's blood culture came back positive for Staph aureus, patient also has a small wound in the left foot with pus draining, I suspect his thoracic spine bone lesion is likely osteomyelitis. ID is on  board, pt has been treated with iv antibiotics.   His serum light chain came back today slightly elevated in both kappa and lambda, which again does not support multiple myeloma.  I initially plan to do bone marrow biopsy on Monday, will cancel it.  I think his anemia is likely related to the infection, continue supportive care with blood transfusion as needed.   His SPEP and UPEP result are still pending, we will f/u next week.   Truitt Merle  07/24/2021

## 2021-07-24 NOTE — H&P (Signed)
Chief Complaint: Patient was seen in consultation today for bone marrow biopsy and aspiration at the request of Mikey Bussing, NP  Referring Physician(s): Mikey Bussing, NP  Supervising Physician: Mir, F  Patient Status: [facility] - In-pt  History of Present Illness: Jose [name] is a 45 y.o. male with PMH of DM2, retinopathy, neuropathy, microalbuminuria, HTN, HLD, CKD 3, COVID-19 infection July 2022.  Patient presented to ED 07/22/2021 complaining of chest pain that radiated to both shoulder blades for approximately 6 weeks.  Chest x-ray showed left lung base atelectasis.  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    Patient currently admitted with MSSA bacteremia.  ID to see patient for further evaluation and management. Patient has been referred for bone marrow biopsy and aspiration by Altamese Dilling, NP.   Chest CT 07/22/2021: IMPRESSION: 1. 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 There is a pathologic fracture of T6 with paraspinal hematoma or tumor. This could represent metastatic bone disease or possibly myeloma. 2. No obvious primary neoplasm involving the chest or upper abdomen. PET-CT may be helpful for further evaluation and extent of the bone disease and possibly detecting a primary neoplasm. 3. Thoracic spine MRI without and with contrast may be helpful for further evaluation of the thoracic disease. I do not see any any obvious spinal canal compromise.   Past Medical History:  Diagnosis Date   GAD (generalized anxiety disorder)    Hyperlipidemia    Macular degeneration, bilateral    Retinopathy    Type II diabetes mellitus with complication, uncontrolled ([place])    retinopathy, neuropathy, microalbuminuria    Past Surgical History:  Procedure Laterality Date    APPENDECTOMY     HERNIA REPAIR     TRIGGER FINGER RELEASE Right 10/25/2019   Procedure: RIGHT INDEX FINGER RELEASE TRIGGER FINGER/A-1 PULLEY;  Surgeon: Daryll Brod, MD;  Location: [place];  Service: Orthopedics;  Laterality: Right;  IV REGIONAL FOREARM BLOCK    Allergies: Patient has no known allergies.  Medications: Prior to Admission medications   Medication Sig Start Date End Date Taking? Authorizing Provider  acetaminophen (TYLENOL) 500 MG tablet Take 1,000 mg by mouth every 6 (six) hours as needed for mild pain, fever or headache.   Yes [provider]  albuterol (VENTOLIN HFA) 108 (90 Base) MCG/ACT inhaler Inhale 1 puff into the lungs every 4 (four) hours as needed for wheezing or shortness of breath. 05/27/21  Yes [provider]  ALPRAZolam (XANAX) 0.25 MG tablet Take 0.25 mg by mouth 2 (two) times daily as needed for anxiety. 06/12/21  Yes [provider]  Ascorbic Acid (VITAMIN C PO) Take 1 tablet by mouth daily.   Yes [provider]  atorvastatin (LIPITOR) 40 MG tablet Take 1 tablet (40 mg total) by mouth daily. 03/12/19  Yes Jacelyn Pi, Lilia Argue, MD  cyclobenzaprine (FLEXERIL) 10 MG tablet Take 10 mg by mouth 2 (two) times daily. 07/09/21  Yes [provider]  Dulaglutide (TRULICITY) 3 VV/6.1QA SOPN Inject 3 mg as directed once a week. 06/11/21  Yes Renato Shin, MD  gabapentin (NEURONTIN) 300 MG capsule TAKE 1 TO 2 CAPSULES(300 TO 600 MG) BY MOUTH AT BEDTIME Patient taking differently: Take 600 mg by mouth at bedtime. 12/01/19  Yes Jacelyn Pi, Lilia Argue, MD  glimepiride [facility]) 1  MG tablet Take 1 tablet (1 mg total) by mouth daily with breakfast. 01/08/21  Yes Renato Shin, MD  HYDROcodone-acetaminophen (NORCO/VICODIN) 5-325 MG tablet Take 1 tablet by mouth 2 (two) times daily as needed for severe pain. 07/07/21  Yes [provider]  losartan (COZAAR) 50 MG tablet TAKE 1 TABLET(50 MG) BY MOUTH DAILY Patient taking  differently: Take 50 mg by mouth 2 (two) times daily. 07/21/20  Yes Jacelyn Pi, Lilia Argue, MD  metFORMIN (GLUCOPHAGE-XR) 500 MG 24 hr tablet Take 1 tablet (500 mg total) by mouth daily. 06/11/21  Yes Renato Shin, MD  Multiple Vitamin (MULTIVITAMIN ADULT PO) Take 1 tablet by mouth daily.   Yes [provider]  oxyCODONE (OXY IR/ROXICODONE) 5 MG immediate release tablet Take 5 mg by mouth 2 (two) times daily as needed for pain or severe pain. 07/21/21  Yes [provider]  Ranibizumab (LUCENTIS) 0.3 MG/0.05ML SOLN 1 Dose by Intravitreal route every 3 (three) months.   Yes [provider]  tadalafil (CIALIS) 20 MG tablet TK 1 T PO  PO QD PRN FOR ERECTILE DYSFUNCTION Patient taking differently: Take 20 mg by mouth daily as needed for erectile dysfunction. 07/20/19  Yes Jacelyn Pi, Irma M, MD  tobramycin (TOBREX) 0.3 % ophthalmic solution Place 1 drop into both eyes See admin instructions. Begin 1 day prior to treatment and continue the day of treatment and for one full day after treatment. 07/05/18  Yes [provider]  traZODone (DESYREL) 100 MG tablet TAKE 1 TABLET(100 MG) BY MOUTH AT BEDTIME AS NEEDED FOR SLEEP Patient taking differently: Take 100 mg by mouth at bedtime. 03/05/21  Yes Ravi, Himabindu, MD  VITAMIN D PO Take 1 tablet by mouth daily.   Yes [provider]  amLODipine (NORVASC) 5 MG tablet Take 5 mg by mouth daily. 03/18/21   [provider]  amphetamine-dextroamphetamine (ADDERALL) 20 MG tablet Take 1 tablet (20 mg total) by mouth 2 (two) times daily. 03/05/21 07/04/21  Elvin So, MD  DULoxetine (CYMBALTA) 60 MG capsule TAKE 1 CAPSULE(60 MG) BY MOUTH DAILY Patient not taking: Reported on 07/22/2021 03/05/21   Elvin So, MD  furosemide (LASIX) 20 MG tablet Take 20 mg by mouth daily. 04/30/21   [provider]  verapamil (CALAN-SR) 120 MG CR tablet Take 120 mg by mouth daily. 04/17/21   [provider]     Family  History  Problem Relation Age of Onset   Diabetes Mother    Hyperlipidemia Mother    Stroke Mother    Diabetes Father    Hyperlipidemia Brother    Stroke Brother    ADD / ADHD Brother    ADD / ADHD Son     Social History   Socioeconomic History   Marital status: Significant Other    Spouse name: Not on file   Number of children: 2   Years of education: Not on file   Highest education level: Bachelor's degree (e.g., BA, AB, BS)  Occupational History   Occupation: Multimedia programmer: [employer]  Tobacco Use   Smoking status: Former    Packs/day: 1.00    Years: 20.00    Pack years: 20.00    Types: Cigarettes    Quit date: 07/2020    Years since quitting: 0.9   Smokeless tobacco: Never  Vaping Use   Vaping Use: Never used  Substance and Sexual Activity   Alcohol use: Not Currently   Drug use: Never  Sexual activity: Yes    Partners: Female    Birth control/protection: None    Comment: with monogamous partner  Other Topics Concern   Not on file  Social History Narrative   Pt has lived 55 of life in [place]. Lives at home with partner, 2 kids, 8 cats, and 1 dog.    Social Determinants of Health   Financial Resource Strain: Not on file  Food Insecurity: Not on file  Transportation Needs: Not on file  Physical Activity: Not on file  Stress: Not on file  Social Connections: Not on file      Review of Systems: A 12 point ROS discussed and pertinent positives are indicated in the HPI above.  All other systems are negative.  Review of Systems  Constitutional:  Positive for chills and fatigue. Negative for appetite change.  Respiratory:  Positive for shortness of breath. Negative for cough.   Cardiovascular:  Positive for chest pain. Negative for leg swelling.  Gastrointestinal:  Negative for abdominal pain, blood in stool, diarrhea, nausea and vomiting.  Musculoskeletal:  Positive for back pain.   Vital Signs: BP 139/81   Pulse  (!) 108   Temp 98.6 F (37 C) (Oral)   Resp 20   Ht _0  (1.803 m)   Wt 193 lb 12.8 oz (87.9 kg)   SpO2 90%   BMI 27.03 kg/m   Physical Exam Vitals reviewed.  Constitutional:      Appearance: He is ill-appearing.  HENT:     Head: Normocephalic and atraumatic.     Mouth/Throat:     Mouth: Mucous membranes are moist.     Pharynx: Oropharynx is clear.  Cardiovascular:     Rate and Rhythm: Regular rhythm. Tachycardia present.     Pulses: Normal pulses.     Heart sounds: Normal heart sounds. No murmur heard.   No gallop.  Pulmonary:     Effort: Pulmonary effort is normal. No respiratory distress.     Breath sounds: Normal breath sounds. No stridor. No wheezing, rhonchi or rales.  Abdominal:     General: Bowel sounds are normal. There is no distension.     Palpations: Abdomen is soft.     Tenderness: There is no abdominal tenderness. There is no guarding.  Musculoskeletal:     Right lower leg: No edema.     Left lower leg: No edema.  Skin:    General: Skin is warm and dry.     Comments: Patient has wound to left great toe with purulent, malodorous drainage.  Wound care nurse at bedside.  Neurological:     Mental Status: He is alert and oriented to person, place, and time. Mental status is at baseline.  Psychiatric:        Mood and Affect: Mood normal.        Behavior: Behavior normal.        Thought Content: Thought content normal.        Judgment: Judgment normal.    Imaging: CT ABDOMEN PELVIS WO CONTRAST  Result Date: 07/23/2021 CLINICAL DATA:  Cancer of unknown primary, lytic thoracic spine lesions EXAM: CT ABDOMEN AND PELVIS WITHOUT CONTRAST TECHNIQUE: Multidetector CT imaging of the abdomen and pelvis was performed following the standard protocol without IV contrast. Unenhanced CT was performed per clinician order. Lack of IV contrast limits sensitivity and specificity, especially for evaluation of abdominal/pelvic solid viscera. COMPARISON:  07/22/2021 FINDINGS:  Lower chest: Hypoventilatory changes at the dependent lower lobes. No acute pleural or  parenchymal lung disease. Hepatobiliary: No gross liver abnormalities on this unenhanced exam. Gallbladder is moderately distended without cholelithiasis or cholecystitis. Pancreas: Unremarkable. No pancreatic ductal dilatation or surrounding inflammatory changes. Spleen: Normal in size without focal abnormality. Adrenals/Urinary Tract: No urinary tract calculi or obstructive uropathy. Excreted contrast within the bladder consistent with preceding MRI. The adrenals are unremarkable. Stomach/Bowel: No bowel obstruction or ileus. Moderate retained stool throughout the colon. The appendix is surgically absent. No bowel wall thickening or inflammatory change. Vascular/Lymphatic: Aortic atherosclerosis. No enlarged abdominal or pelvic lymph nodes. Reproductive: Prostate is unremarkable. Other: No free fluid or free gas. Small fat containing bilateral inguinal hernias. No bowel herniation. Musculoskeletal: There are no acute or destructive bony lesions. Reconstructed images demonstrate no additional findings. IMPRESSION: 1. No acute intra-abdominal or intrapelvic process. 2. Moderate fecal retention consistent with constipation. 3.  Aortic Atherosclerosis (ICD10-I70.0). Electronically Signed   By: Randa Ngo M.D.   On: 07/23/2021 22:26   DG Chest 2 View  Result Date: 07/22/2021 CLINICAL DATA:  chest pain EXAM: CHEST - 2 VIEW COMPARISON:  None. FINDINGS: The cardiomediastinal silhouette is within normal limits. No pleural effusion. No pneumothorax. No lobar consolidation. Minimal left base subsegmental atelectasis. No acute osseous abnormality. IMPRESSION: Minimal left lung base subsegmental atelectasis. No other acute abnormality in the chest. Electronically Signed   By: Albin Felling M.D.   On: 07/22/2021 11:05   DG Chest 2 View  Result Date: 06/28/2021 CLINICAL DATA:  Right-sided chest pain and right arm pain. EXAM: CHEST  - 2 VIEW COMPARISON:  05/18/2021 FINDINGS: The heart size and mediastinal contours are within normal limits. Both lungs are clear. The visualized skeletal structures are unremarkable. IMPRESSION: No active cardiopulmonary disease. Electronically Signed   By: Franchot Gallo M.D.   On: 06/28/2021 09:41   CT Chest Wo Contrast  Result Date: 07/22/2021 CLINICAL DATA:  Chest pain EXAM: CT CHEST WITHOUT CONTRAST TECHNIQUE: Multidetector CT imaging of the chest was performed following the standard protocol without IV contrast. COMPARISON:  None. FINDINGS: Cardiovascular: The heart is within normal limits in size. No pericardial effusion the aorta is normal in caliber. Minimal scattered atherosclerotic calcifications. Age advanced coronary artery calcifications. Mediastinum/Nodes: Small scattered mediastinal and hilar lymph nodes but no mass or overt adenopathy. Borderline enlarged subcarinal node measuring 12 mm could be related to the thoracic spine process. The esophagus is grossly normal. Lungs/Pleura: Areas of streaky bibasilar atelectasis. No focal airspace consolidation/pneumonia. No worrisome pulmonary lesions. No pleural effusions or pleural lesions. Upper Abdomen: No significant upper abdominal findings. Musculoskeletal: No chest wall mass, supraclavicular or axillary adenopathy. There are several small bilateral thyroid nodules. None of these measures greater than 12 mm. Not clinically significant; no follow-up imaging recommended (ref: J Am Coll Radiol. 2015 Feb;12(2): 143-50). There are lytic destructive bone lesions involving the T5 and T6 vertebral bodies with a mild pathologic compression fracture of T6. Paraspinal hematoma or tumor noted at both these levels. No definite findings for spinal canal compromise. Mild lucency involving the sternum could also reflect lytic bone disease. I do not see any obvious rib lesions or pathologic rib fractures. IMPRESSION: 1. Lytic bone disease involving T5 and T6 and  possibly the sternum. There is a pathologic fracture of T6 with paraspinal hematoma or tumor. This could represent metastatic bone disease or possibly myeloma. 2. No obvious primary neoplasm involving the chest or upper abdomen. PET-CT may be helpful for further evaluation and extent of the bone disease and possibly detecting a primary neoplasm. 3.  Thoracic spine MRI without and with contrast may be helpful for further evaluation of the thoracic disease. I do not see any any obvious spinal canal compromise. Aortic Atherosclerosis (ICD10-I70.0). Electronically Signed   By: Marijo Sanes M.D.   On: 07/22/2021 16:51   MR THORACIC SPINE W WO CONTRAST  Result Date: 07/23/2021 CLINICAL DATA:  Bone lesion, T-spine, malignancy suspected; Bone lesion, L/S-spine, malignancy suspected EXAM: MRI THORACIC AND LUMBAR SPINE WITHOUT AND WITH CONTRAST TECHNIQUE: Multiplanar and multiecho pulse sequences of the thoracic and lumbar spine were obtained without and with intravenous contrast. CONTRAST:  5m GADAVIST GADOBUTROL 1 MMOL/ML IV SOLN COMPARISON:  None. FINDINGS: MRI THORACIC SPINE FINDINGS Alignment:  Physiologic. Vertebrae: There is abnormal bone marrow signal and contrast enhancement at T4 and T5. There is mild edema of the disc space. There is a minimally depressed fracture of the superior T5 endplate. Cord:  Normal signal and morphology. Paraspinal and other soft tissues: There is phlegmon like contrast enhancement of the prevertebral soft tissues at the T4 and T5 levels. No epidural abscess. Disc levels: There is no spinal canal or neural foraminal stenosis. MRI LUMBAR SPINE FINDINGS Segmentation:  Standard. Alignment:  Physiologic. Vertebrae:  No fracture, evidence of discitis, or bone lesion. Conus medullaris: Extends to the L1 level and appears normal. Paraspinal and other soft tissues: Negative. Disc levels: No spinal canal or neural foraminal stenosis. IMPRESSION: 1. Abnormal bone marrow signal and contrast  enhancement at the T4 and T5 levels with mild edema of the disc space and prevertebral phlegmon/hematoma. The findings could indicate osseous metastatic disease, multiple myeloma or discitis-osteomyelitis. 2. Minimally depressed compression fracture of T5 without retropulsion. 3. No epidural abscess. 4. No spinal canal or neural foraminal stenosis. Electronically Signed   By: KUlyses JarredM.D.   On: 07/23/2021 19:55   MR Lumbar Spine W Wo Contrast  Result Date: 07/23/2021 CLINICAL DATA:  Bone lesion, T-spine, malignancy suspected; Bone lesion, L/S-spine, malignancy suspected EXAM: MRI THORACIC AND LUMBAR SPINE WITHOUT AND WITH CONTRAST TECHNIQUE: Multiplanar and multiecho pulse sequences of the thoracic and lumbar spine were obtained without and with intravenous contrast. CONTRAST:  165mGADAVIST GADOBUTROL 1 MMOL/ML IV SOLN COMPARISON:  None. FINDINGS: MRI THORACIC SPINE FINDINGS Alignment:  Physiologic. Vertebrae: There is abnormal bone marrow signal and contrast enhancement at T4 and T5. There is mild edema of the disc space. There is a minimally depressed fracture of the superior T5 endplate. Cord:  Normal signal and morphology. Paraspinal and other soft tissues: There is phlegmon like contrast enhancement of the prevertebral soft tissues at the T4 and T5 levels. No epidural abscess. Disc levels: There is no spinal canal or neural foraminal stenosis. MRI LUMBAR SPINE FINDINGS Segmentation:  Standard. Alignment:  Physiologic. Vertebrae:  No fracture, evidence of discitis, or bone lesion. Conus medullaris: Extends to the L1 level and appears normal. Paraspinal and other soft tissues: Negative. Disc levels: No spinal canal or neural foraminal stenosis. IMPRESSION: 1. Abnormal bone marrow signal and contrast enhancement at the T4 and T5 levels with mild edema of the disc space and prevertebral phlegmon/hematoma. The findings could indicate osseous metastatic disease, multiple myeloma or discitis-osteomyelitis.  2. Minimally depressed compression fracture of T5 without retropulsion. 3. No epidural abscess. 4. No spinal canal or neural foraminal stenosis. Electronically Signed   By: KeUlyses Jarred.D.   On: 07/23/2021 19:55   NM Pulmonary Perfusion  Result Date: 07/22/2021 CLINICAL DATA:  PE suspected, low/intermediate probability positive D-dimer. EXAM: NUCLEAR MEDICINE PERFUSION LUNG SCAN  TECHNIQUE: Perfusion images were obtained in multiple projections after intravenous injection of radiopharmaceutical. Ventilation scans intentionally deferred if perfusion scan and chest x-ray adequate for interpretation during COVID 19 epidemic. RADIOPHARMACEUTICALS:  4.4 mCi Tc-59mMAA IV COMPARISON:  None. FINDINGS: Normal perfusion.  No focal defects. IMPRESSION: No evidence for PE. Electronically Signed   By: CSan MorelleM.D.   On: 07/22/2021 16:26    Labs:  CBC: Recent Labs    05/20/21 0359 07/22/21 1051 07/23/21 0508 07/24/21 0550  WBC 7.1 14.3* 14.4* 13.0*  HGB 9.0* 7.5* 8.6* 7.2*  HCT 26.7* 23.0* 26.2* 22.1*  PLT 167 405* 371 332    COAGS: No results for input(s): INR, APTT in the last 8760 hours.  BMP: Recent Labs    05/21/21 0406 07/22/21 1051 07/23/21 0508 07/24/21 0550  NA 136 134* 135 131*  K 3.9 4.8 4.3 4.3  CL 104 98 103 101  CO2 _0 GLUCOSE 141* 184* 144* 133*  BUN 35* 25* 22* 26*  CALCIUM 7.8* 9.4 9.2 8.4*  CREATININE 1.55* 1.99* 1.38* 2.15*  GFRNONAA 55* 41* >60 37*    LIVER FUNCTION TESTS: Recent Labs    05/18/21 1703 05/20/21 0359  BILITOT 0.7 0.7  AST 24 17  ALT 26 19  ALKPHOS 90 69  PROT 8.1 6.6  ALBUMIN 4.4 3.4*    TUMOR MARKERS: No results for input(s): AFPTM, CEA, CA199, CHROMGRNA in the last 8760 hours.  Assessment and Plan:  History of PMH of DM2, retinopathy, neuropathy, microalbuminuria, HTN, HLD, CKD 3, COVID-19 infection July 2022.  Patient presented to ED 07/22/2021 complaining of chest pain that radiated to both shoulder blades  for approximately 6 weeks.  Chest x-ray showed left lung base atelectasis.  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    Patient currently admitted with MSSA bacteremia.  ID to see patient for further evaluation and management. Patient has been referred for bone marrow biopsy and aspiration by KAltamese Dilling NP.   Patient found to be sitting upright in bed with wife at bedside.  He is ill-appearing and experiences pain upon any upper body movement.  He is alert and oriented, calm and pleasant. VSS Pt and wife verbalized understanding of nothing to eat/drink after midnight 07/26/21.  Labs 07/24/2021: WBC 13.0 Hgb 7.2 Creatinine 2.15  Risks and benefits of bone marrow biopsy and aspiration was discussed with the patient and/or patient's family including, but not limited to bleeding, infection, damage to adjacent structures or low yield requiring additional tests.  All of the questions were answered and there is agreement to proceed.  Consent signed and in chart.   Thank you for this interesting consult.  I greatly enjoyed meeting JKAL CHAITand look forward to participating in their care.  A copy of this report was sent to the requesting provider on this date.  Electronically Signed: STyson Alias NP 07/24/2021, 12:18 PM   I spent a total of 30 minutes in face to face in clinical consultation, greater than 50% of which was counseling/coordinating care for bone marrow biopsy and aspiration.

## 2021-07-24 NOTE — Consult Note (Signed)
Reason for Consult: MSSA bacteremia and ulcer of left foot Referring Physician: Dr. [name] is an 45 y.o. male.  HPI: He is known to my partner Dr. 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  Wound care nurse change the dressing on the left hallux today and noted a small amount of bloody and purulent drainage  Past Medical History:  Diagnosis Date   GAD (generalized anxiety disorder)    Hyperlipidemia    Macular degeneration, bilateral    Retinopathy    Type II diabetes mellitus with complication, uncontrolled (HCC)    retinopathy, neuropathy, microalbuminuria    Past Surgical History:  Procedure Laterality Date   APPENDECTOMY     HERNIA REPAIR     TRIGGER FINGER RELEASE Right 10/25/2019   Procedure: RIGHT INDEX FINGER RELEASE TRIGGER FINGER/A-1 PULLEY;  Surgeon: Daryll Brod, MD;  Location: [place];  Service: Orthopedics;  Laterality: Right;  IV REGIONAL FOREARM BLOCK    Family History  Problem Relation Age of Onset   Diabetes Mother    Hyperlipidemia Mother    Stroke Mother    Diabetes Father    Hyperlipidemia Brother    Stroke Brother    ADD / ADHD Brother    ADD / ADHD Son     Social History:  reports that he quit smoking about a year ago. His smoking use included cigarettes. He has a 20.00 pack-year smoking history. He has never used smokeless tobacco. He reports that he does not currently use alcohol. He reports that he does not use drugs.  Allergies: No Known Allergies  Medications: I have reviewed the patient's current medications.  Results for orders placed or performed during the hospital encounter of 07/22/21 (from the past 48 hour(s))  Culture, blood (routine x 2)     Status: None (Preliminary result)   Collection Time: 07/22/21  3:13 PM   Specimen: BLOOD  Result Value Ref Range   Specimen Description      BLOOD LEFT ANTECUBITAL Performed at [facility], [place] [address], [place], [place] [address]    Special Requests      BOTTLES DRAWN AEROBIC ONLY Blood Culture results may not be optimal due to an inadequate volume of blood received in culture bottles Performed at [facility] [address], [place], [place] [address]    Culture      NO GROWTH 2 DAYS Performed at [facility], [place] [address] [place], [place] [address]    Report Status PENDING   Urinalysis, Routine w reflex microscopic     Status: Abnormal   Collection Time: 07/22/21  6:07 PM  Result Value Ref Range   Color, Urine STRAW (A) YELLOW   APPearance CLEAR CLEAR   Specific Gravity, Urine 1.015 1.005 - 1.030   pH 6.0 5.0 - 8.0   Glucose, UA NEGATIVE NEGATIVE mg/dL   Hgb urine dipstick TRACE (A) NEGATIVE   Bilirubin Urine NEGATIVE NEGATIVE   Ketones, ur NEGATIVE NEGATIVE mg/dL   Protein, ur 100 (A) NEGATIVE mg/dL   Nitrite NEGATIVE NEGATIVE   Leukocytes,Ua NEGATIVE NEGATIVE   RBC / HPF 0-5 0 - 5 RBC/hpf   WBC, UA 0-5 0 - 5 WBC/hpf   Bacteria, UA NONE SEEN NONE SEEN   Mucus PRESENT    Hyaline Casts, UA PRESENT     Comment: Performed at [facility], [place] [address] [place],  [address]  SARS CORONAVIRUS 2 (TAT 6-24 HRS) Nasopharyngeal     Status: None   Collection Time: 07/22/21  6:07 PM   Specimen: Nasopharyngeal  Result Value Ref Range   SARS Coronavirus 2 NEGATIVE NEGATIVE    Comment: (NOTE) SARS-CoV-2 target nucleic acids are NOT DETECTED.  The SARS-CoV-2 RNA is generally detectable in upper and lower respiratory specimens during the acute phase of infection. Negative results do not preclude SARS-CoV-2 infection, do not rule out co-infections with other pathogens, and should not be used as the sole basis for treatment or other patient management decisions. Negative results must be combined with clinical observations, patient history, and epidemiological information. The expected result is  Negative.  Fact Sheet for Patients: SugarRoll.be  Fact Sheet for Healthcare Providers: https://www.woods-mathews.com/  This test is not yet approved or cleared by the [place] FDA and  has been authorized for detection and/or diagnosis of SARS-CoV-2 by FDA under an Emergency Use Authorization (EUA). This EUA will remain  in effect (meaning this test can be used) for the duration of the COVID-19 declaration under Se ction 564(b)(1) of the Act, 21 U.S.C. section 360bbb-3(b)(1), unless the authorization is terminated or revoked sooner.  Performed at [facility], [place] [address], [place], [place] [address]   Prepare RBC (crossmatch)     Status: None   Collection Time: 07/22/21  6:40 PM  Result Value Ref Range   Order Confirmation      ORDER PROCESSED BY BLOOD BANK Performed at [facility], [place] [address] [place], [place] [address]   Glucose, capillary     Status: Abnormal   Collection Time: 07/22/21 10:59 PM  Result Value Ref Range   Glucose-Capillary 155 (H) 70 - 99 mg/dL    Comment: Glucose reference range applies only to samples taken after fasting for at least 8 hours.  Lactate dehydrogenase     Status: Abnormal   Collection Time: 07/23/21  5:08 AM  Result Value Ref Range   LDH 77 (L) 98 - 192 U/L    Comment: Performed at [facility], [place] [address], [place], [place] [address]  Basic metabolic panel     Status: Abnormal   Collection Time: 07/23/21  5:08 AM  Result Value Ref Range   Sodium 135 135 - 145 mmol/L   Potassium 4.3 3.5 - 5.1 mmol/L   Chloride 103 98 - 111 mmol/L   CO2 25 22 - 32 mmol/L   Glucose, Bld 144 (H) 70 - 99 mg/dL    Comment: Glucose reference range applies only to samples taken after fasting for at least 8 hours.   BUN 22 (H) 6 - 20 mg/dL   Creatinine, Ser 1.38 (H) 0.61 - 1.24 mg/dL   Calcium 9.2 8.9 - 10.3 mg/dL   GFR, Estimated >60 >60 mL/min    Comment:  (NOTE) Calculated using the CKD-EPI Creatinine Equation (2021)    Anion gap 7 5 - 15    Comment: Performed at [facility], [place] [address] [place], [place] [address]  CBC     Status: Abnormal   Collection Time: 07/23/21  5:08 AM  Result Value Ref Range   WBC 14.4 (H) 4.0 - 10.5 K/uL   RBC 3.20 (L) 4.22 - 5.81 MIL/uL   Hemoglobin 8.6 (L) 13.0 - 17.0 g/dL   HCT 26.2 (L) 39.0 - 52.0 %   MCV 81.9 80.0 - 100.0 fL   MCH 26.9 26.0 - 34.0 pg   MCHC 32.8  30.0 - 36.0 g/dL   RDW 12.9 11.5 - 15.5 %   Platelets 371 150 - 400 K/uL   nRBC 0.0 0.0 - 0.2 %    Comment: Performed at [facility], [place] [address] [place], [place] [address]  Reticulocytes     Status: Abnormal   Collection Time: 07/23/21  5:08 AM  Result Value Ref Range   Retic Ct Pct 1.1 0.4 - 3.1 %   RBC. 3.15 (L) 4.22 - 5.81 MIL/uL   Retic Count, Absolute 35.3 19.0 - 186.0 K/uL   Immature Retic Fract 10.5 2.3 - 15.9 %    Comment: Performed at [facility], [place] [address], [place], [place] [address]  PSA     Status: None   Collection Time: 07/23/21  5:08 AM  Result Value Ref Range   Prostatic Specific Antigen 0.35 0.00 - 4.00 ng/mL    Comment: (NOTE) While PSA levels of <=4.0 ng/ml are reported as reference range, some men with levels below 4.0 ng/ml can have prostate cancer and many men with PSA above 4.0 ng/ml do not have prostate cancer.  Other tests such as free PSA, age specific reference ranges, PSA velocity and PSA doubling time may be helpful especially in men less than 78 years old. Performed at [facility], [place] [address], [place], [place] [address]   Vitamin B12     Status: None   Collection Time: 07/23/21  5:08 AM  Result Value Ref Range   Vitamin B-12 252 180 - 914 pg/mL    Comment: (NOTE) This assay is not validated for testing neonatal or myeloproliferative syndrome specimens for Vitamin B12 levels. Performed at [facility], [place] [address], [place], [place] [address]   Folate     Status: None   Collection Time: 07/23/21  5:08 AM  Result Value Ref Range   Folate 13.3 >5.9 ng/mL    Comment: Performed at [facility], [place] [address] [place], [place] [address]  Iron and TIBC     Status: Abnormal   Collection Time: 07/23/21  5:08 AM  Result Value Ref Range   Iron 21 (L) 45 - 182 ug/dL   TIBC 223 (L) 250 - 450 ug/dL   Saturation Ratios 9 (L) 17.9 - 39.5 %   UIBC 202 ug/dL    Comment: Performed at [facility], [place] [address] [place], [place] [address]  Ferritin     Status: None   Collection Time: 07/23/21  5:08 AM  Result Value Ref Range   Ferritin 193 24 - 336 ng/mL    Comment: Performed at [facility], [place] [address], [place], [place] [address]  Glucose, capillary     Status: Abnormal   Collection Time: 07/23/21  7:54 AM  Result Value Ref Range   Glucose-Capillary 124 (H) 70 - 99 mg/dL    Comment: Glucose reference range applies only to samples taken after fasting for at least 8 hours.  Glucose, capillary     Status: Abnormal   Collection Time: 07/23/21 12:46 PM  Result Value Ref Range   Glucose-Capillary 258 (H) 70 - 99 mg/dL    Comment: Glucose reference range applies only to samples taken after fasting for at least 8 hours.  Glucose, capillary     Status: Abnormal   Collection Time: 07/23/21  4:26 PM  Result Value Ref Range   Glucose-Capillary 148 (H) 70 - 99 mg/dL    Comment: Glucose  reference range applies only to samples taken after fasting for at least 8 hours.  Glucose, capillary     Status: Abnormal   Collection Time: 07/23/21 10:30 PM  Result Value Ref Range   Glucose-Capillary 149 (H) 70 - 99 mg/dL    Comment: Glucose reference range applies only to samples taken after fasting for at least 8 hours.  Basic metabolic panel     Status: Abnormal   Collection Time: 07/24/21  5:50 AM  Result Value Ref Range    Sodium 131 (L) 135 - 145 mmol/L   Potassium 4.3 3.5 - 5.1 mmol/L   Chloride 101 98 - 111 mmol/L   CO2 24 22 - 32 mmol/L   Glucose, Bld 133 (H) 70 - 99 mg/dL    Comment: Glucose reference range applies only to samples taken after fasting for at least 8 hours.   BUN 26 (H) 6 - 20 mg/dL   Creatinine, Ser 2.15 (H) 0.61 - 1.24 mg/dL   Calcium 8.4 (L) 8.9 - 10.3 mg/dL   GFR, Estimated 37 (L) >60 mL/min    Comment: (NOTE) Calculated using the CKD-EPI Creatinine Equation (2021)    Anion gap 6 5 - 15    Comment: Performed at [facility], [place] [address], [place], [place] [address]  CBC with Differential/Platelet     Status: Abnormal   Collection Time: 07/24/21  5:50 AM  Result Value Ref Range   WBC 13.0 (H) 4.0 - 10.5 K/uL   RBC 2.62 (L) 4.22 - 5.81 MIL/uL   Hemoglobin 7.2 (L) 13.0 - 17.0 g/dL   HCT 22.1 (L) 39.0 - 52.0 %   MCV 84.4 80.0 - 100.0 fL   MCH 27.5 26.0 - 34.0 pg   MCHC 32.6 30.0 - 36.0 g/dL   RDW 13.3 11.5 - 15.5 %   Platelets 332 150 - 400 K/uL   nRBC 0.0 0.0 - 0.2 %   Neutrophils Relative % 72 %   Neutro Abs 9.2 (H) 1.7 - 7.7 K/uL   Lymphocytes Relative 13 %   Lymphs Abs 1.7 0.7 - 4.0 K/uL   Monocytes Relative 14 %   Monocytes Absolute 1.8 (H) 0.1 - 1.0 K/uL   Eosinophils Relative 1 %   Eosinophils Absolute 0.2 0.0 - 0.5 K/uL   Basophils Relative 0 %   Basophils Absolute 0.0 0.0 - 0.1 K/uL   Immature Granulocytes 0 %   Abs Immature Granulocytes 0.05 0.00 - 0.07 K/uL    Comment: Performed at [facility], [place] [address], [place], [place] [address]  Magnesium     Status: None   Collection Time: 07/24/21  5:50 AM  Result Value Ref Range   Magnesium 1.8 1.7 - 2.4 mg/dL    Comment: Performed at [facility], [place] [address], [place], [place] [address]  Glucose, capillary     Status: Abnormal   Collection Time: 07/24/21  8:24 AM  Result Value Ref Range   Glucose-Capillary 133 (H) 70 - 99 mg/dL    Comment: Glucose  reference range applies only to samples taken after fasting for at least 8 hours.  Glucose, capillary     Status: Abnormal   Collection Time: 07/24/21 12:23 PM  Result Value Ref Range   Glucose-Capillary 123 (H) 70 - 99 mg/dL    Comment: Glucose reference range applies only to samples taken after fasting for at least 8 hours.    CT ABDOMEN PELVIS WO CONTRAST  Result Date: 07/23/2021 CLINICAL  DATA:  Cancer of unknown primary, lytic thoracic spine lesions EXAM: CT ABDOMEN AND PELVIS WITHOUT CONTRAST TECHNIQUE: Multidetector CT imaging of the abdomen and pelvis was performed following the standard protocol without IV contrast. Unenhanced CT was performed per clinician order. Lack of IV contrast limits sensitivity and specificity, especially for evaluation of abdominal/pelvic solid viscera. COMPARISON:  07/22/2021 FINDINGS: Lower chest: Hypoventilatory changes at the dependent lower lobes. No acute pleural or parenchymal lung disease. Hepatobiliary: No gross liver abnormalities on this unenhanced exam. Gallbladder is moderately distended without cholelithiasis or cholecystitis. Pancreas: Unremarkable. No pancreatic ductal dilatation or surrounding inflammatory changes. Spleen: Normal in size without focal abnormality. Adrenals/Urinary Tract: No urinary tract calculi or obstructive uropathy. Excreted contrast within the bladder consistent with preceding MRI. The adrenals are unremarkable. Stomach/Bowel: No bowel obstruction or ileus. Moderate retained stool throughout the colon. The appendix is surgically absent. No bowel wall thickening or inflammatory change. Vascular/Lymphatic: Aortic atherosclerosis. No enlarged abdominal or pelvic lymph nodes. Reproductive: Prostate is unremarkable. Other: No free fluid or free gas. Small fat containing bilateral inguinal hernias. No bowel herniation. Musculoskeletal: There are no acute or destructive bony lesions. Reconstructed images demonstrate no additional findings.  IMPRESSION: 1. No acute intra-abdominal or intrapelvic process. 2. Moderate fecal retention consistent with constipation. 3.  Aortic Atherosclerosis (ICD10-I70.0). Electronically Signed   By: Randa Ngo M.D.   On: 07/23/2021 22:26   CT Chest Wo Contrast  Result Date: 07/22/2021 CLINICAL DATA:  Chest pain EXAM: CT CHEST WITHOUT CONTRAST TECHNIQUE: Multidetector CT imaging of the chest was performed following the standard protocol without IV contrast. COMPARISON:  None. FINDINGS: Cardiovascular: The heart is within normal limits in size. No pericardial effusion the aorta is normal in caliber. Minimal scattered atherosclerotic calcifications. Age advanced coronary artery calcifications. Mediastinum/Nodes: Small scattered mediastinal and hilar lymph nodes but no mass or overt adenopathy. Borderline enlarged subcarinal node measuring 12 mm could be related to the thoracic spine process. The esophagus is grossly normal. Lungs/Pleura: Areas of streaky bibasilar atelectasis. No focal airspace consolidation/pneumonia. No worrisome pulmonary lesions. No pleural effusions or pleural lesions. Upper Abdomen: No significant upper abdominal findings. Musculoskeletal: No chest wall mass, supraclavicular or axillary adenopathy. There are several small bilateral thyroid nodules. None of these measures greater than 12 mm. Not clinically significant; no follow-up imaging recommended (ref: J Am Coll Radiol. 2015 Feb;12(2): 143-50). There are lytic destructive bone lesions involving the T5 and T6 vertebral bodies with a mild pathologic compression fracture of T6. Paraspinal hematoma or tumor noted at both these levels. No definite findings for spinal canal compromise. Mild lucency involving the sternum could also reflect lytic bone disease. I do not see any obvious rib lesions or pathologic rib fractures. IMPRESSION: 1. 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 There is a pathologic fracture of T6 with  paraspinal hematoma or tumor. This could represent metastatic bone disease or possibly myeloma. 2. No obvious primary neoplasm involving the chest or upper abdomen. PET-CT may be helpful for further evaluation and extent of the bone disease and possibly detecting a primary neoplasm. 3. Thoracic spine MRI without and with contrast may be helpful for further evaluation of the thoracic disease. I do not see any any obvious spinal canal compromise. Aortic Atherosclerosis (ICD10-I70.0). Electronically Signed   By: Marijo Sanes M.D.   On: 07/22/2021 16:51   MR THORACIC SPINE W WO CONTRAST  Result Date: 07/23/2021 CLINICAL DATA:  Bone lesion, T-spine, malignancy suspected; Bone lesion, L/S-spine, malignancy suspected EXAM:  MRI THORACIC AND LUMBAR SPINE WITHOUT AND WITH CONTRAST TECHNIQUE: Multiplanar and multiecho pulse sequences of the thoracic and lumbar spine were obtained without and with intravenous contrast. CONTRAST:  46m GADAVIST GADOBUTROL 1 MMOL/ML IV SOLN COMPARISON:  None. FINDINGS: MRI THORACIC SPINE FINDINGS Alignment:  Physiologic. Vertebrae: There is abnormal bone marrow signal and contrast enhancement at T4 and T5. There is mild edema of the disc space. There is a minimally depressed fracture of the superior T5 endplate. Cord:  Normal signal and morphology. Paraspinal and other soft tissues: There is phlegmon like contrast enhancement of the prevertebral soft tissues at the T4 and T5 levels. No epidural abscess. Disc levels: There is no spinal canal or neural foraminal stenosis. MRI LUMBAR SPINE FINDINGS Segmentation:  Standard. Alignment:  Physiologic. Vertebrae:  No fracture, evidence of discitis, or bone lesion. Conus medullaris: Extends to the L1 level and appears normal. Paraspinal and other soft tissues: Negative. Disc levels: No spinal canal or neural foraminal stenosis. IMPRESSION: 1. Abnormal bone marrow signal and contrast enhancement at the T4 and T5 levels with mild edema of the disc  space and prevertebral phlegmon/hematoma. The findings could indicate osseous metastatic disease, multiple myeloma or discitis-osteomyelitis. 2. Minimally depressed compression fracture of T5 without retropulsion. 3. No epidural abscess. 4. No spinal canal or neural foraminal stenosis. Electronically Signed   By: KUlyses JarredM.D.   On: 07/23/2021 19:55   MR Lumbar Spine W Wo Contrast  Result Date: 07/23/2021 CLINICAL DATA:  Bone lesion, T-spine, malignancy suspected; Bone lesion, L/S-spine, malignancy suspected EXAM: MRI THORACIC AND LUMBAR SPINE WITHOUT AND WITH CONTRAST TECHNIQUE: Multiplanar and multiecho pulse sequences of the thoracic and lumbar spine were obtained without and with intravenous contrast. CONTRAST:  147mGADAVIST GADOBUTROL 1 MMOL/ML IV SOLN COMPARISON:  None. FINDINGS: MRI THORACIC SPINE FINDINGS Alignment:  Physiologic. Vertebrae: There is abnormal bone marrow signal and contrast enhancement at T4 and T5. There is mild edema of the disc space. There is a minimally depressed fracture of the superior T5 endplate. Cord:  Normal signal and morphology. Paraspinal and other soft tissues: There is phlegmon like contrast enhancement of the prevertebral soft tissues at the T4 and T5 levels. No epidural abscess. Disc levels: There is no spinal canal or neural foraminal stenosis. MRI LUMBAR SPINE FINDINGS Segmentation:  Standard. Alignment:  Physiologic. Vertebrae:  No fracture, evidence of discitis, or bone lesion. Conus medullaris: Extends to the L1 level and appears normal. Paraspinal and other soft tissues: Negative. Disc levels: No spinal canal or neural foraminal stenosis. IMPRESSION: 1. Abnormal bone marrow signal and contrast enhancement at the T4 and T5 levels with mild edema of the disc space and prevertebral phlegmon/hematoma. The findings could indicate osseous metastatic disease, multiple myeloma or discitis-osteomyelitis. 2. Minimally depressed compression fracture of T5 without  retropulsion. 3. No epidural abscess. 4. No spinal canal or neural foraminal stenosis. Electronically Signed   By: KeUlyses Jarred.D.   On: 07/23/2021 19:55   NM Pulmonary Perfusion  Result Date: 07/22/2021 CLINICAL DATA:  PE suspected, low/intermediate probability positive D-dimer. EXAM: NUCLEAR MEDICINE PERFUSION LUNG SCAN TECHNIQUE: Perfusion images were obtained in multiple projections after intravenous injection of radiopharmaceutical. Ventilation scans intentionally deferred if perfusion scan and chest x-ray adequate for interpretation during COVID 19 epidemic. RADIOPHARMACEUTICALS:  4.4 mCi Tc-9963mA IV COMPARISON:  None. FINDINGS: Normal perfusion.  No focal defects. IMPRESSION: No evidence for PE. Electronically Signed   By: ChrSan MorelleD.   On: 07/22/2021 16:26   ECHOCARDIOGRAM COMPLETE  Result Date: 07/24/2021    ECHOCARDIOGRAM REPORT   Patient Name:   Jose [name] Date of Exam: 07/24/2021 Medical Rec #:  [id number]       Height:       71.0 in Accession #:    [id number]      Weight:       193.8 lb Date of Birth:  [birth year]       BSA:          2.081 m Patient Age:    41 years        BP:           139/81 mmHg Patient Gender: M               HR:           111 bpm. Exam Location:  Inpatient Procedure: 2D Echo, Color Doppler and Cardiac Doppler Indications:    Bacteremia  History:        Patient has no prior history of Echocardiogram examinations.                 Risk Factors:Diabetes and Dyslipidemia.  Sonographer:    Raquel Sarna Senior RDCS Referring Phys: [id number] Malachy Moan THOMPSON  Sonographer Comments: Chest wall very tender to the touch, exam performed with minimal probe pressure for patient comfort. Wall motion obtained from short axis. IMPRESSIONS  1. Left ventricular ejection fraction, by estimation, is >75%. The left ventricle has hyperdynamic function. The left ventricle has no regional wall motion abnormalities. Left ventricular diastolic parameters are indeterminate.  2. Right  ventricular systolic function is normal. The right ventricular size is normal. There is normal pulmonary artery systolic pressure. The estimated right ventricular systolic pressure is 33.5 mmHg.  3. The mitral valve is grossly normal. No evidence of mitral valve regurgitation.  4. The aortic valve is tricuspid. Aortic valve regurgitation is not visualized.  5. The inferior vena cava is normal in size with <50% respiratory variability, suggesting right atrial pressure of 8 mmHg. Comparison(s): No prior Echocardiogram. Conclusion(s)/Recommendation(s): No evidence of valvular vegetations on this transthoracic echocardiogram, however the right-sided valves are poorly visualized. Would recommend a transesophageal echocardiogram to exclude infective endocarditis if clinically indicated. FINDINGS  Left Ventricle: Left ventricular ejection fraction, by estimation, is >75%. The left ventricle has hyperdynamic function. The left ventricle has no regional wall motion abnormalities. The left ventricular internal cavity size was small. There is no left  ventricular hypertrophy. Left ventricular diastolic parameters are indeterminate. Right Ventricle: The right ventricular size is normal. No increase in right ventricular wall thickness. Right ventricular systolic function is normal. There is normal pulmonary artery systolic pressure. The tricuspid regurgitant velocity is 2.59 m/s, and  with an assumed right atrial pressure of 8 mmHg, the estimated right ventricular systolic pressure is 45.6 mmHg. Left Atrium: Left atrial size was normal in size. Right Atrium: Right atrial size was normal in size. Pericardium: There is no evidence of pericardial effusion. Mitral Valve: The mitral valve is grossly normal. No evidence of mitral valve regurgitation. Tricuspid Valve: The tricuspid valve is grossly normal. Tricuspid valve regurgitation is trivial. Aortic Valve: The aortic valve is tricuspid. Aortic valve regurgitation is not  visualized. Pulmonic Valve: The pulmonic valve was normal in structure. Pulmonic valve regurgitation is not visualized. Aorta: The aortic root and ascending aorta are structurally normal, with no evidence of dilitation. Venous: The inferior vena cava is normal in size with less than 50% respiratory variability, suggesting right atrial pressure of 8 mmHg.  IAS/Shunts: No atrial level shunt detected by color flow Doppler.  LEFT VENTRICLE PLAX 2D LVIDd:         3.70 cm LVIDs:         2.15 cm LV PW:         1.15 cm LV IVS:        0.90 cm LVOT diam:     1.90 cm LV SV:         56 LV SV Index:   27 LVOT Area:     2.84 cm  RIGHT VENTRICLE RV S prime:     27.80 cm/s TAPSE (M-mode): 3.6 cm LEFT ATRIUM           Index LA diam:      3.25 cm 1.56 cm/m LA Vol (A4C): 28.3 ml 13.60 ml/m  AORTIC VALVE LVOT Vmax:   123.00 cm/s LVOT Vmean:  95.500 cm/s LVOT VTI:    0.197 m  AORTA Ao Root diam: 3.70 cm TRICUSPID VALVE TR Peak grad:   26.8 mmHg TR Vmax:        259.00 cm/s  SHUNTS Systemic VTI:  0.20 m Systemic Diam: 1.90 cm Lyman Bishop MD Electronically signed by Lyman Bishop MD Signature Date/Time: 07/24/2021/1:53:34 PM    Final     Review of Systems  Constitutional:  Negative for chills and fever.  Musculoskeletal:  Positive for joint pain and myalgias.  Skin:        Ulcer left hallux  Blood pressure (!) 175/94, pulse (!) 119, temperature 98.5 F (36.9 C), temperature source Oral, resp. rate 18, height 5' 11"  (1.803 m), weight 87.9 kg, SpO2 91 %.  Vitals:   07/24/21 0414 07/24/21 1351  BP: 139/81 (!) 175/94  Pulse: (!) 108 (!) 119  Resp: 20 18  Temp: 98.6 F (37 C) 98.5 F (36.9 C)  SpO2: 90% 91%    General AA&O x3. Normal mood and affect.  Vascular Dorsalis pedis and posterior tibial pulses  present 2+ bilaterally  Capillary refill normal to all digits. Pedal hair growth diminished.  Neurologic Epicritic sensation grossly absent.  Dermatologic (Wound) Partial thickness ulcer medial hallux, appears to be  a deroofed blister, nothing probes to bone with no exposed subcutaneous tissue tendon or joint, mild erythema extending to the MTPJ  Orthopedic: Motor intact BLE.     Assessment/Plan:  Left hallux ulcer -Overall ulcer appears to be quite benign and partial-thickness in nature.  There is no exposed bone tendon or joint -Would favor other sources as source of MSSA bacteremia I am doubtful that this is from the wound.  If other sources ruled out would evaluate with an MRI -Daily wound care with mupirocin ointment and dry sterile dressing changes. -I will reevaluate him on Sunday -WBAT LLE -Currently on Ancef for MSSA bacteremia  Jose [name] 07/24/2021, 2:35 PM   Best available via secure chat for questions or concerns.

## 2021-07-24 NOTE — Consult Note (Signed)
[facility]    Date of Admission:  07/22/2021     Reason for Consult: mssa bacteremia     Referring Provider: Grandville Silos   Lines:  Peripheral iv's  Abx: 9/29-c cefazolin        Assessment: 45 yo male with dm2, peripheral neuropathy/retinopathy, ckd3 (bl cr 1.1-1.2), hx covid infection 04/2021, admitted 9/28 for chest pain radiating to his shoulders for the past 6 weeks found to have ct imaging lytic lesion t5-6/sternum concerning for MM, and also mssa bacteremia   9/28 bcx mssa Tte no obvious vegetation; normal lvef  Unclear how long patient has had mssa bacteremia. Suspect he might have MM and could be immunosuppressed enough to have it for a while without exhibiting sepsis sx/sign. The source is usually more of a semantic exercise to determine, but could be translocation from the toe or gi/oral source  We have to presume the spine t4-6 area is involved as staph aureus bsi likes to lodge in injured osseous area. No abscess visualized on mri scan  Will also need to r/o endocarditis. Tte negative but will need tee  Anticipate at least 6 weeks abx treatment     Plan: Continue cefazolin Repeat blood cultures sent today [place] Dates is desired but nothing likely to happen this weekend; will arrange next week Agree with podiatry no immediate w/u for left hallux warranted at this time Oncologic workup per med-onc team Discussed with primary team   Will follow up on Monday If any acute ID issue please contact ID team on call for this weekend (Dr Linus Salmons)   I spent 60 minute reviewing data/chart, and coordinating care and >50% direct face to face time providing counseling/discussing diagnostics/treatment plan with patient      ------------------------------------------------ Principal Problem:   Lytic bone lesions on xray Active Problems:   Type 2 diabetes mellitus with diabetic polyneuropathy, without long-term current use of insulin (HCC)    Hyperlipidemia   GAD (generalized anxiety disorder)   AKI (acute kidney injury) ([place])   Anemia   MSSA bacteremia   Constipation    HPI: Jose [name] is a 45 y.o. male hx 23 pyh, dm2 with neuropathy/retinopathy, chronic ulcer left hallux, admitted with 6 weeks of chest pain then involving the back as well, with imaging evidence t4-6 malignancy vs infection, and mssa bacteremia  Patient has progressive pain in the chest "like a tourniquet" 6 weeks ago. Start with pain at sternum then the whole chest then the back No fever/chill No other joint pain  No orthopedic device/vascular catheter presence  He also had bilateral hallux ulcer after wearing some new shoes since 02/2021. Has had skin graft outpatient by podiatry. The right foot had healed but the left foot has not. The ulcer doesn't appear any larger and no pus/swelling/foul odor discharge  On presentation  Afebrile; mild leukocytosis Imaging ct/mri showed sternal and t4-6 lytic lesion Also has elevated creatinine Bcx returns unsuspectingly with mssa Started on cefazolin when bcx resulted hd#1  Oncology team evaluating and plans bone marrow biopsy to r/o mm. Serologic testing for MM sent.  Podiatry also saw patient. There is no surgical plan to debride/investigate the left foot further     Family History  Problem Relation Age of Onset   Diabetes Mother    Hyperlipidemia Mother    Stroke Mother    Diabetes Father    Hyperlipidemia Brother    Stroke Brother    ADD / ADHD  Brother    ADD / ADHD Son     Social History   Tobacco Use   Smoking status: Former    Packs/day: 1.00    Years: 20.00    Pack years: 20.00    Types: Cigarettes    Quit date: 07/2020    Years since quitting: 0.9   Smokeless tobacco: Never  Vaping Use   Vaping Use: Never used  Substance Use Topics   Alcohol use: Not Currently   Drug use: Never    No Known Allergies  Review of Systems: ROS All Other ROS was negative, except mentioned  above   Past Medical History:  Diagnosis Date   GAD (generalized anxiety disorder)    Hyperlipidemia    Macular degeneration, bilateral    Retinopathy    Type II diabetes mellitus with complication, uncontrolled (HCC)    retinopathy, neuropathy, microalbuminuria       Scheduled Meds:  atorvastatin  40 mg Oral Daily   bisacodyl  10 mg Rectal Daily   cyanocobalamin  1,000 mcg Subcutaneous Daily   cyclobenzaprine  10 mg Oral BID   feeding supplement  237 mL Oral BID BM   gabapentin  200 mg Oral TID   heparin  5,000 Units Subcutaneous Q8H   hydrALAZINE  25 mg Oral Q8H   insulin aspart  0-5 Units Subcutaneous QHS   insulin aspart  0-9 Units Subcutaneous TID WC   multivitamin with minerals  1 tablet Oral Daily   oxyCODONE  15 mg Oral Q12H   polyethylene glycol  17 g Oral BID   senna-docusate  1 tablet Oral BID   traZODone  100 mg Oral QHS   verapamil  120 mg Oral Daily   Continuous Infusions:  sodium chloride 100 mL/hr at 07/24/21 0756    ceFAZolin (ANCEF) IV 200 mL/hr at 07/24/21 0600   PRN Meds:.acetaminophen, albuterol, ALPRAZolam, hydrALAZINE, HYDROmorphone (DILAUDID) injection, oxyCODONE   OBJECTIVE: Blood pressure 139/81, pulse (!) 108, temperature 98.6 F (37 C), temperature source Oral, resp. rate 20, height 5' 11"  (1.803 m), weight 87.9 kg, SpO2 90 %.  Physical Exam General/constitutional: no distress, pleasant HEENT: Normocephalic, PER, Conj Clear, EOMI, Oropharynx clear Neck supple CV: rrr no mrg Lungs: clear to auscultation, normal respiratory effort Abd: Soft, Nontender Ext: no edema Skin: No Rash-- multiple tatoos on upper ext Neuro: nonfocal MSK: no peripheral joint swelling/tenderness/warmth; back spines nontender -- see picture of the foot    Central line presence: no   Lab Results Lab Results  Component Value Date   WBC 13.0 (H) 07/24/2021   HGB 7.2 (L) 07/24/2021   HCT 22.1 (L) 07/24/2021   MCV 84.4 07/24/2021   PLT 332 07/24/2021     Lab Results  Component Value Date   CREATININE 2.15 (H) 07/24/2021   BUN 26 (H) 07/24/2021   NA 131 (L) 07/24/2021   K 4.3 07/24/2021   CL 101 07/24/2021   CO2 24 07/24/2021    Lab Results  Component Value Date   ALT 19 05/20/2021   AST 17 05/20/2021   ALKPHOS 69 05/20/2021   BILITOT 0.7 05/20/2021      Microbiology: Recent Results (from the past 240 hour(s))  Culture, blood (routine x 2)     Status: Abnormal (Preliminary result)   Collection Time: 07/22/21  2:11 PM   Specimen: BLOOD  Result Value Ref Range Status   Specimen Description   Final    BLOOD RIGHT ANTECUBITAL Performed at [facility], [place]  [address], [place], [place] [address]    Special Requests   Final    BOTTLES DRAWN AEROBIC AND ANAEROBIC Blood Culture results may not be optimal due to an excessive volume of blood received in culture bottles Performed at [facility] [address], [place], [place] [address]    Culture  Setup Time   Final    GRAM POSITIVE COCCI IN CLUSTERS IN BOTH AEROBIC AND ANAEROBIC BOTTLES CRITICAL RESULT CALLED TO, READ BACK BY AND VERIFIED WITH: PHARMD M.SWAYME AT 3976 ON 07/23/2021 BY T.SAAD.    Culture (A)  Final    STAPHYLOCOCCUS AUREUS SUSCEPTIBILITIES TO FOLLOW Performed at [facility], [place] [address] [place], [place] [address]    Report Status PENDING  Incomplete  Blood Culture ID Panel (Reflexed)     Status: Abnormal   Collection Time: 07/22/21  2:11 PM  Result Value Ref Range Status   Enterococcus faecalis NOT DETECTED NOT DETECTED Final   Enterococcus Faecium NOT DETECTED NOT DETECTED Final   Listeria monocytogenes NOT DETECTED NOT DETECTED Final   Staphylococcus species DETECTED (A) NOT DETECTED Final    Comment: CRITICAL RESULT CALLED TO, READ BACK BY AND VERIFIED WITH: PHARMD M.SWAYME AT 1547 ON 07/23/2021 BY T.SAAD.    Staphylococcus aureus (BCID) DETECTED (A) NOT DETECTED Final    Comment: CRITICAL RESULT  CALLED TO, READ BACK BY AND VERIFIED WITH: PHARMD M.SWAYME AT 1547 ON 07/23/2021 BY T.SAAD.    Staphylococcus epidermidis NOT DETECTED NOT DETECTED Final   Staphylococcus lugdunensis NOT DETECTED NOT DETECTED Final   Streptococcus species NOT DETECTED NOT DETECTED Final   Streptococcus agalactiae NOT DETECTED NOT DETECTED Final   Streptococcus pneumoniae NOT DETECTED NOT DETECTED Final   Streptococcus pyogenes NOT DETECTED NOT DETECTED Final   A.calcoaceticus-baumannii NOT DETECTED NOT DETECTED Final   Bacteroides fragilis NOT DETECTED NOT DETECTED Final   Enterobacterales NOT DETECTED NOT DETECTED Final   Enterobacter cloacae complex NOT DETECTED NOT DETECTED Final   Escherichia coli NOT DETECTED NOT DETECTED Final   Klebsiella aerogenes NOT DETECTED NOT DETECTED Final   Klebsiella oxytoca NOT DETECTED NOT DETECTED Final   Klebsiella pneumoniae NOT DETECTED NOT DETECTED Final   Proteus species NOT DETECTED NOT DETECTED Final   Salmonella species NOT DETECTED NOT DETECTED Final   Serratia marcescens NOT DETECTED NOT DETECTED Final   Haemophilus influenzae NOT DETECTED NOT DETECTED Final   Neisseria meningitidis NOT DETECTED NOT DETECTED Final   Pseudomonas aeruginosa NOT DETECTED NOT DETECTED Final   Stenotrophomonas maltophilia NOT DETECTED NOT DETECTED Final   Candida albicans NOT DETECTED NOT DETECTED Final   Candida auris NOT DETECTED NOT DETECTED Final   Candida glabrata NOT DETECTED NOT DETECTED Final   Candida krusei NOT DETECTED NOT DETECTED Final   Candida parapsilosis NOT DETECTED NOT DETECTED Final   Candida tropicalis NOT DETECTED NOT DETECTED Final   Cryptococcus neoformans/gattii NOT DETECTED NOT DETECTED Final   Meth resistant mecA/C and MREJ NOT DETECTED NOT DETECTED Final    Comment: Performed at [facility], [address] [place], [place] [address]  Culture, blood (routine x 2)     Status: None (Preliminary result)   Collection Time: 07/22/21  3:13 PM    Specimen: BLOOD  Result Value Ref Range Status   Specimen Description   Final    BLOOD LEFT ANTECUBITAL Performed at [facility] [address] [place],  [address]    Special Requests   Final    BOTTLES  DRAWN AEROBIC ONLY Blood Culture results may not be optimal due to an inadequate volume of blood received in culture bottles Performed at [facility], [place] [address], [place], [place] [address]    Culture   Final    NO GROWTH < 24 HOURS Performed at [facility] [address] [place], [place] [address]    Report Status PENDING  Incomplete  SARS CORONAVIRUS 2 (TAT 6-24 HRS) Nasopharyngeal     Status: None   Collection Time: 07/22/21  6:07 PM   Specimen: Nasopharyngeal  Result Value Ref Range Status   SARS Coronavirus 2 NEGATIVE NEGATIVE Final    Comment: (NOTE) SARS-CoV-2 target nucleic acids are NOT DETECTED.  The SARS-CoV-2 RNA is generally detectable in upper and lower respiratory specimens during the acute phase of infection. Negative results do not preclude SARS-CoV-2 infection, do not rule out co-infections with other pathogens, and should not be used as the sole basis for treatment or other patient management decisions. Negative results must be combined with clinical observations, patient history, and epidemiological information. The expected result is Negative.  Fact Sheet for Patients: SugarRoll.be  Fact Sheet for Healthcare Providers: https://www.woods-mathews.com/  This test is not yet approved or cleared by the [place] FDA and  has been authorized for detection and/or diagnosis of SARS-CoV-2 by FDA under an Emergency Use Authorization (EUA). This EUA will remain  in effect (meaning this test can be used) for the duration of the COVID-19 declaration under Se ction 564(b)(1) of the Act, 21 U.S.C. section 360bbb-3(b)(1), unless the authorization is terminated  or revoked sooner.  Performed at [facility], [place] [address] [place], [place] [address]      Serology:    Imaging: If present, new imagings (plain films, ct scans, and mri) have been personally visualized and interpreted; radiology reports have been reviewed. Decision making incorporated into the Impression / Recommendations.  9/30 tte 1. Left ventricular ejection fraction, by estimation, is >75%. The left  ventricle has hyperdynamic function. The left ventricle has no regional  wall motion abnormalities. Left ventricular diastolic parameters are  indeterminate.   2. Right ventricular systolic function is normal. The right ventricular  size is normal. There is normal pulmonary artery systolic pressure. The  estimated right ventricular systolic pressure is 91.6 mmHg.   3. The mitral valve is grossly normal. No evidence of mitral valve  regurgitation.   4. The aortic valve is tricuspid. Aortic valve regurgitation is not  visualized.   5. The inferior vena cava is normal in size with <50% respiratory  variability, suggesting right atrial pressure of 8 mmHg.   9/29 ct abd pelv 1. No acute intra-abdominal or intrapelvic process. 2. Moderate fecal retention consistent with constipation. 3.  Aortic Atherosclerosis   9/29 mri lumbar thoracic spine 1. Abnormal bone marrow signal and contrast enhancement at the T4 and T5 levels with mild edema of the disc space and prevertebral phlegmon/hematoma. The findings could indicate osseous metastatic disease, multiple myeloma or discitis-osteomyelitis. 2. Minimally depressed compression fracture of T5 without retropulsion. 3. No epidural abscess. 4. No spinal canal or neural foraminal stenosis  9/28 vq scan No evidence for PE  9/28 ct chest 1. 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 There is a pathologic fracture of T6 with paraspinal hematoma or tumor. This could represent metastatic bone disease or  possibly myeloma. 2. No obvious primary neoplasm involving the chest or upper abdomen. PET-CT may be helpful  for further evaluation and extent of the bone disease and possibly detecting a primary neoplasm. 3. Thoracic spine MRI without and with contrast may be helpful for further evaluation of the thoracic disease. I do not see any any obvious spinal canal compromise   Jabier Mutton, [facility] for [place] [phone] pager    07/24/2021, 9:11 AM

## 2021-07-24 NOTE — Consult Note (Signed)
[facility] Nurse Consult Note: Patient receiving care in [facility]. Reason for Consult: left great toe wound Wound type: unclear etiology for original wound that the patient states started around [facility] Day of this year. Pressure Injury POA: Yes/No/NA Measurement: The left great toe is without erythema.   When I began removing the dressing that had been on for 3 days, pink and tan pus poured out from two different areas of the toe.  The most pus came from the wound area closest to the head of the first metatarsal, medial aspect of the foot.  A smaller amount of the same type of drainage came from a slit in the thickened skin at the head of the proximal phalanx.  The patient has neuropathy and could not sense pain in the toe.  There is also a nearly blackened area between these two areas that is dry and basically equivalent to eschar. Wound bed: the patient and his spouse both state it "looks worse" than when they last dressed it. Drainage (amount, consistency, odor)  Periwound: Dressing procedure/placement/frequency: Apply iodine from the swabsticks or swab pads from clean utility to the left great toe.  Allow to dry. WRAP IN DRY GAUZE.  I directly communicated my findings to Dr. Irine Seal. He reached out via Secure Chat to the podiatry specialist for their involvement in the patient's care. Thank you for the consult.  Discussed plan of care with the patient and bedside nurse.  [facility] nurse will not follow at this time.  Please re-consult the [facility] team if needed.  Val Riles, RN, MSN, CWOCN, CNS-BC, pager [phone]

## 2021-07-25 DIAGNOSIS — M899 Disorder of bone, unspecified: Secondary | ICD-10-CM | POA: Diagnosis not present

## 2021-07-25 DIAGNOSIS — D649 Anemia, unspecified: Secondary | ICD-10-CM | POA: Diagnosis not present

## 2021-07-25 DIAGNOSIS — R52 Pain, unspecified: Secondary | ICD-10-CM | POA: Diagnosis not present

## 2021-07-25 DIAGNOSIS — Z515 Encounter for palliative care: Secondary | ICD-10-CM

## 2021-07-25 DIAGNOSIS — N179 Acute kidney failure, unspecified: Secondary | ICD-10-CM | POA: Diagnosis not present

## 2021-07-25 DIAGNOSIS — K5900 Constipation, unspecified: Secondary | ICD-10-CM | POA: Diagnosis not present

## 2021-07-25 LAB — CULTURE, BLOOD (ROUTINE X 2)

## 2021-07-25 LAB — CBC WITH DIFFERENTIAL/PLATELET
Abs Immature Granulocytes: 0.07 10*3/uL (ref 0.00–0.07)
Basophils Absolute: 0 10*3/uL (ref 0.0–0.1)
Basophils Relative: 0 %
Eosinophils Absolute: 0.2 10*3/uL (ref 0.0–0.5)
Eosinophils Relative: 1 %
HCT: 21.5 % — ABNORMAL LOW (ref 39.0–52.0)
Hemoglobin: 7.1 g/dL — ABNORMAL LOW (ref 13.0–17.0)
Immature Granulocytes: 1 %
Lymphocytes Relative: 11 %
Lymphs Abs: 1.4 10*3/uL (ref 0.7–4.0)
MCH: 28.1 pg (ref 26.0–34.0)
MCHC: 33 g/dL (ref 30.0–36.0)
MCV: 85 fL (ref 80.0–100.0)
Monocytes Absolute: 1.8 10*3/uL — ABNORMAL HIGH (ref 0.1–1.0)
Monocytes Relative: 14 %
Neutro Abs: 9.4 10*3/uL — ABNORMAL HIGH (ref 1.7–7.7)
Neutrophils Relative %: 73 %
Platelets: 321 10*3/uL (ref 150–400)
RBC: 2.53 MIL/uL — ABNORMAL LOW (ref 4.22–5.81)
RDW: 13.2 % (ref 11.5–15.5)
WBC: 12.9 10*3/uL — ABNORMAL HIGH (ref 4.0–10.5)
nRBC: 0 % (ref 0.0–0.2)

## 2021-07-25 LAB — BASIC METABOLIC PANEL
Anion gap: 10 (ref 5–15)
BUN: 26 mg/dL — ABNORMAL HIGH (ref 6–20)
CO2: 22 mmol/L (ref 22–32)
Calcium: 8.9 mg/dL (ref 8.9–10.3)
Chloride: 103 mmol/L (ref 98–111)
Creatinine, Ser: 2.21 mg/dL — ABNORMAL HIGH (ref 0.61–1.24)
GFR, Estimated: 36 mL/min — ABNORMAL LOW (ref >60)
Glucose, Bld: 165 mg/dL — ABNORMAL HIGH (ref 70–99)
Potassium: 4.5 mmol/L (ref 3.5–5.1)
Sodium: 135 mmol/L (ref 135–145)

## 2021-07-25 LAB — HEMOGLOBIN AND HEMATOCRIT, BLOOD
HCT: 26 % — ABNORMAL LOW (ref 39.0–52.0)
Hemoglobin: 8.6 g/dL — ABNORMAL LOW (ref 13.0–17.0)

## 2021-07-25 LAB — GLUCOSE, CAPILLARY
Glucose-Capillary: 147 mg/dL — ABNORMAL HIGH (ref 70–99)
Glucose-Capillary: 168 mg/dL — ABNORMAL HIGH (ref 70–99)
Glucose-Capillary: 161 mg/dL — ABNORMAL HIGH (ref 70–99)
Glucose-Capillary: 156 mg/dL — ABNORMAL HIGH (ref 70–99)

## 2021-07-25 LAB — PREPARE RBC (CROSSMATCH)

## 2021-07-25 MED ORDER — FUROSEMIDE 10 MG/ML IJ SOLN
20.0000 mg | Freq: Once | INTRAMUSCULAR | Status: AC
  Administered 2021-07-25: 20 mg via INTRAVENOUS
  Filled 2021-07-25: qty 2

## 2021-07-25 MED ORDER — SODIUM CHLORIDE 0.9 % IV SOLN
25.0000 mg | Freq: Four times a day (QID) | INTRAVENOUS | Status: DC | PRN
  Administered 2021-07-26 – 2021-07-31 (×2): 25 mg via INTRAVENOUS
  Filled 2021-07-25 (×2): qty 1

## 2021-07-25 MED ORDER — FENTANYL 12 MCG/HR TD PT72
1.0000 | MEDICATED_PATCH | TRANSDERMAL | Status: DC
  Administered 2021-07-25: 1 via TRANSDERMAL
  Filled 2021-07-25 (×2): qty 1

## 2021-07-25 MED ORDER — DIPHENHYDRAMINE HCL 25 MG PO CAPS
25.0000 mg | ORAL_CAPSULE | Freq: Once | ORAL | Status: AC
  Administered 2021-07-25: 25 mg via ORAL
  Filled 2021-07-25: qty 1

## 2021-07-25 MED ORDER — ACETAMINOPHEN 325 MG PO TABS
650.0000 mg | ORAL_TABLET | Freq: Once | ORAL | Status: AC
  Administered 2021-07-25: 650 mg via ORAL
  Filled 2021-07-25: qty 2

## 2021-07-25 MED ORDER — SODIUM CHLORIDE 0.9% IV SOLUTION: Freq: Once | INTRAVENOUS | Status: AC

## 2021-07-25 MED ORDER — SODIUM CHLORIDE 0.9 % IV SOLN
25.0000 mg | Freq: Once | INTRAVENOUS | Status: DC
  Filled 2021-07-25: qty 1

## 2021-07-25 MED ORDER — HYDRALAZINE HCL 50 MG PO TABS
50.0000 mg | ORAL_TABLET | Freq: Three times a day (TID) | ORAL | Status: DC
  Administered 2021-07-25 – 2021-08-03 (×26): 50 mg via ORAL
  Filled 2021-07-25 (×26): qty 1

## 2021-07-25 MED ORDER — HYDROMORPHONE HCL 1 MG/ML IJ SOLN
1.0000 mg | INTRAMUSCULAR | Status: DC | PRN
  Administered 2021-07-25 – 2021-07-26 (×4): 1 mg via INTRAVENOUS
  Filled 2021-07-25 (×4): qty 1

## 2021-07-25 NOTE — Consult Note (Signed)
Consultation Note Date: 07/25/2021   Patient Name: RHONDA VANGIESON  DOB: [birth year]  MRN: [id number]  Age / Sex: 45 y.o., male  PCP: [name], NP Referring Physician: Eugenie Filler, MD  Reason for Consultation: Non pain symptom management and Pain control  HPI/Patient Profile: 45 y.o. male  with past medical history of type 2 diabetes with retinopathy, neuropathy, microalbuminemia, history of hypertension dyslipidemia stage III chronic kidney disease, COVID-19 infection in July 2022 admitted on 07/22/2021 with chest pain radiating to bilateral shoulder blades for 6 weeks.   Clinical Assessment and Goals of Care: Patient presented to the emergency department and was admitted to hospital medicine service.  Patient was found to have CT chest with lytic bone lesions involving T5 and T6 as well as the sternum.  There was concern for pathologic fracture of T5.  Multiple myeloma is in the differential diagnosis.  Found to have MSSA bacteremia. Patient is being followed by medical oncology and infectious disease.  He was also seen by podiatry because of left hallux ulcer.  Patient was seen by interventional radiology and is to undergo bone marrow biopsy on 07-27-2021.  He continues on antibiotics. Palliative consultation for pain management has been requested. Patient is awake alert resting in bed.  He winces and grimaces episodically.  He has chest pain as well as back pain.  Introduced myself and palliative care as follows: Palliative medicine is specialized medical care for people living with serious illness. It focuses on providing relief from the symptoms and stress of a serious illness. The goal is to improve quality of life for both the patient and the family. Goals of care: Broad aims of medical therapy in relation to the patient's values and preferences. Our aim is to provide medical care aimed at enabling  patients to achieve the goals that matter most to them, given the circumstances of their particular medical situation and their constraints.  Chart reviewed, patient seen and examined.  Discussed extensively with the patient about his current hospitalization as well as options for pain and on pain symptom management. Brief life review performed.  Patient is married lives at home with his wife.  4 children.  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 Patient complains of pain in his sacral area as well, states that he has a history of pilonidal cyst has required to be drained in the past. For active listening and supportive care.  See below.  Thank you for the consult.  NEXT OF KIN Wife  SUMMARY OF RECOMMENDATIONS   Full code/full scope of care. Pain management: Chart reviewed.  Discussed with patient: Patient is on OxyContin 15 mg every 12 hours, oxycodone immediate release 5 mg every 4 hours on an as-needed basis.  Additionally also on IV Dilaudid 1 mg every 4 hours.  Patient states that that the IV Dilaudid lasts only for 2 hours or so and is at the best medication to take the edge off of acute pain that he feels in his chest and back.  Patient is on a good bowel regimen.  He has still not had a bowel movement.  CT scan also showed fecal retention.  Plan: Opioid rotate from OxyContin to transdermal fentanyl because of patient's stage III chronic kidney disease and slowy rising serum creatinine.  Start at 12 mcg, uptitrate as needed. Change IV Dilaudid to 1 mg every 3 hours on an as-needed basis and monitor.  Continue oxycodone immediate release at current dose and monitor. Low dose Xanax is sufficient for his anxiety management at present, continue to monitor.  Medication list reviewed patient is on adjuvants such as Tylenol Flexeril Neurontin. Agree completely with current bowel regimen, discussed with bedside RN, patient to get Dulcolax rectal suppository today. Thank you for the consult.   Code Status/Advance Care Planning: Full code   Symptom Management:    Palliative Prophylaxis:  Frequent Pain Assessment  Additional Recommendations (Limitations, Scope, Preferences): Full Scope Treatment  Psycho-social/Spiritual:  Desire for further Chaplaincy support:yes Additional Recommendations: Caregiving  Support/Resources  Prognosis:  Unable to determine  Discharge Planning: To Be Determined      Primary Diagnoses: Present on Admission:  Anemia  AKI (acute kidney injury) ([place])  Hyperlipidemia  Type 2 diabetes mellitus with diabetic polyneuropathy, without long-term current use of insulin (HCC)  GAD (generalized anxiety disorder)  MSSA bacteremia  Constipation   I have reviewed the medical record, interviewed the patient and family, and examined the patient. The following aspects are pertinent.  Past Medical History:  Diagnosis Date   GAD (generalized anxiety disorder)    Hyperlipidemia    Macular degeneration, bilateral    Retinopathy    Type II diabetes mellitus with complication, uncontrolled (HCC)    retinopathy, neuropathy, microalbuminuria   Social History   Socioeconomic History   Marital status: Significant Other    Spouse name: Not on file   Number of children: 2   Years of education: Not on file   Highest education level: Bachelor's degree (e.g., BA, AB, BS)  Occupational History   Occupation: Multimedia programmer: [employer]  Tobacco Use   Smoking status: Former    Packs/day: 1.00    Years: 20.00    Pack years: 20.00    Types: Cigarettes    Quit date: 07/2020    Years since quitting: 1.0   Smokeless tobacco: Never  Vaping Use   Vaping Use: Never used  Substance and Sexual Activity   Alcohol use: Not Currently   Drug use: Never   Sexual activity: Yes    Partners: Female    Birth control/protection: None    Comment: with monogamous partner  Other Topics Concern   Not on file  Social History Narrative    Pt has lived 58 of life in [place]. Lives at home with partner, 2 kids, 8 cats, and 1 dog.    Social Determinants of Health   Financial Resource Strain: Not on file  Food Insecurity: Not on file  Transportation Needs: Not on file  Physical Activity: Not on file  Stress: Not on file  Social Connections: Not on file   Family History  Problem Relation Age of Onset   Diabetes Mother    Hyperlipidemia Mother    Stroke Mother    Diabetes Father    Hyperlipidemia Brother    Stroke Brother    ADD / ADHD Brother    ADD / ADHD Son    Scheduled Meds:  sodium chloride   Intravenous Once   acetaminophen  650  mg Oral Once   atorvastatin  40 mg Oral Daily   bisacodyl  10 mg Rectal Daily   cyanocobalamin  1,000 mcg Subcutaneous Daily   cyclobenzaprine  10 mg Oral BID   diphenhydrAMINE  25 mg Oral Once   feeding supplement  237 mL Oral BID BM   fentaNYL  1 patch Transdermal Q72H   furosemide  20 mg Intravenous Once   gabapentin  200 mg Oral TID   heparin  5,000 Units Subcutaneous Q8H   hydrALAZINE  25 mg Oral Q8H   insulin aspart  0-5 Units Subcutaneous QHS   insulin aspart  0-9 Units Subcutaneous TID WC   lactulose  20 g Oral BID   multivitamin with minerals  1 tablet Oral Daily   mupirocin ointment  1 application Topical BID   senna-docusate  1 tablet Oral BID   traZODone  100 mg Oral QHS   verapamil  120 mg Oral Daily   Continuous Infusions:   ceFAZolin (ANCEF) IV 2 g (07/25/21 0526)   PRN Meds:.acetaminophen, albuterol, ALPRAZolam, hydrALAZINE, HYDROmorphone (DILAUDID) injection, oxyCODONE Medications Prior to Admission:  Prior to Admission medications   Medication Sig Start Date End Date Taking? Authorizing Provider  acetaminophen (TYLENOL) 500 MG tablet Take 1,000 mg by mouth every 6 (six) hours as needed for mild pain, fever or headache.   Yes [provider]  albuterol (VENTOLIN HFA) 108 (90 Base) MCG/ACT inhaler Inhale 1 puff into the lungs every 4  (four) hours as needed for wheezing or shortness of breath. 05/27/21  Yes [provider]  ALPRAZolam (XANAX) 0.25 MG tablet Take 0.25 mg by mouth 2 (two) times daily as needed for anxiety. 06/12/21  Yes [provider]  Ascorbic Acid (VITAMIN C PO) Take 1 tablet by mouth daily.   Yes [provider]  atorvastatin (LIPITOR) 40 MG tablet Take 1 tablet (40 mg total) by mouth daily. 03/12/19  Yes Jacelyn Pi, Lilia Argue, MD  cyclobenzaprine (FLEXERIL) 10 MG tablet Take 10 mg by mouth 2 (two) times daily. 07/09/21  Yes [provider]  Dulaglutide (TRULICITY) 3 MA/2.6JF SOPN Inject 3 mg as directed once a week. 06/11/21  Yes Renato Shin, MD  gabapentin (NEURONTIN) 300 MG capsule TAKE 1 TO 2 CAPSULES(300 TO 600 MG) BY MOUTH AT BEDTIME Patient taking differently: Take 600 mg by mouth at bedtime. 12/01/19  Yes Jacelyn Pi, Lilia Argue, MD  glimepiride (AMARYL) 1 MG tablet Take 1 tablet (1 mg total) by mouth daily with breakfast. 01/08/21  Yes Renato Shin, MD  HYDROcodone-acetaminophen (NORCO/VICODIN) 5-325 MG tablet Take 1 tablet by mouth 2 (two) times daily as needed for severe pain. 07/07/21  Yes [provider]  losartan (COZAAR) 50 MG tablet TAKE 1 TABLET(50 MG) BY MOUTH DAILY Patient taking differently: Take 50 mg by mouth 2 (two) times daily. 07/21/20  Yes Jacelyn Pi, Lilia Argue, MD  metFORMIN (GLUCOPHAGE-XR) 500 MG 24 hr tablet Take 1 tablet (500 mg total) by mouth daily. 06/11/21  Yes Renato Shin, MD  Multiple Vitamin (MULTIVITAMIN ADULT PO) Take 1 tablet by mouth daily.   Yes [provider]  oxyCODONE (OXY IR/ROXICODONE) 5 MG immediate release tablet Take 5 mg by mouth 2 (two) times daily as needed for pain or severe pain. 07/21/21  Yes [provider]  Ranibizumab (LUCENTIS) 0.3 MG/0.05ML SOLN 1 Dose by Intravitreal route every 3 (three) months.   Yes [provider]  tadalafil (CIALIS) 20 MG tablet TK 1 T PO  PO  QD PRN FOR ERECTILE  DYSFUNCTION Patient taking differently: Take 20 mg by mouth daily as needed for erectile dysfunction. 07/20/19  Yes Jacelyn Pi, Irma M, MD  tobramycin (TOBREX) 0.3 % ophthalmic solution Place 1 drop into both eyes See admin instructions. Begin 1 day prior to treatment and continue the day of treatment and for one full day after treatment. 07/05/18  Yes [provider]  traZODone (DESYREL) 100 MG tablet TAKE 1 TABLET(100 MG) BY MOUTH AT BEDTIME AS NEEDED FOR SLEEP Patient taking differently: Take 100 mg by mouth at bedtime. 03/05/21  Yes Ravi, Himabindu, MD  VITAMIN D PO Take 1 tablet by mouth daily.   Yes [provider]  amLODipine (NORVASC) 5 MG tablet Take 5 mg by mouth daily. 03/18/21   [provider]  amphetamine-dextroamphetamine (ADDERALL) 20 MG tablet Take 1 tablet (20 mg total) by mouth 2 (two) times daily. 03/05/21 07/04/21  Elvin So, MD  DULoxetine (CYMBALTA) 60 MG capsule TAKE 1 CAPSULE(60 MG) BY MOUTH DAILY Patient not taking: Reported on 07/22/2021 03/05/21   Elvin So, MD  furosemide (LASIX) 20 MG tablet Take 20 mg by mouth daily. 04/30/21   [provider]  verapamil (CALAN-SR) 120 MG CR tablet Take 120 mg by mouth daily. 04/17/21   [provider]   No Known Allergies Review of Systems + chest pain + back pain +hiccups  Physical Exam Young gentleman resting in bed Awake alert oriented Mood and affect within normal limits Regular work of breathing Left great toe is bandaged  Abdomen is not distended  Vital Signs: BP 135/90 (BP Location: Right Arm)   Pulse (!) 108   Temp 98.8 F (37.1 C) (Oral)   Resp 16   Ht 5' 11"  (1.803 m)   Wt 87.6 kg   SpO2 92%   BMI 26.94 kg/m  Pain Scale: 0-10   Pain Score: 8    SpO2: SpO2: 92 % O2 Device:SpO2: 92 % O2 Flow Rate: .O2 Flow Rate (L/min): 2 L/min  IO: Intake/output summary:  Intake/Output Summary (Last 24 hours) at 07/25/2021 1048 Last data filed at 07/25/2021  0316 Gross per 24 hour  Intake 1427.91 ml  Output --  Net 1427.91 ml    LBM: Last BM Date: 07/21/21 Baseline Weight: Weight: 87.9 kg Most recent weight: Weight: 87.6 kg     Palliative Assessment/Data:   PPS 60%  Time In: 9.30 Time Out: 10.40 Time Total: 70 Greater than 50%  of this time was spent counseling and coordinating care related to the above assessment and plan.  Signed by: Loistine Chance, MD   Please contact Palliative Medicine Team phone at [phone] for questions and concerns.  For individual provider: See Shea Evans

## 2021-07-25 NOTE — Progress Notes (Signed)
PROGRESS NOTE    [name]  PJK:[id number] DOB: 1976-08-26 DOA: 07/22/2021 PCP: [name], NP    Chief Complaint  Patient presents with   Chest Pain    Brief Narrative:  Patient 45 year old gentleman history of type 2 diabetes with retinopathy, neuropathy, microalbuminemia, hypertension, hyperlipidemia, chronic kidney disease stage III (baseline 1.1-1.2), COVID-19 infection July 2022 presented to the ED with complaints of chest pain radiating to bilateral shoulder blades x6 weeks.  Seen by PCP x-ray done showed abnormal curvature of the spine MRI ordered which was scheduled for October however due to worsening symptoms presented to the ED.  Patient noted to be afebrile.  Systolic blood pressures in the 160s.  Patient with a leukocytosis, hemoglobin at 7.5, D-dimer slightly elevated with VQ scan negative for PE.  CT chest without contrast with lytic bone lesions involving T5 and T6 and the sternum, concern for pathologic fracture of T6 with paraspinal hematoma or tumor could represent metastatic bone disease or possibly myeloma.  Patient pancultured with blood cultures 3/4 bottles positive for MSSA.  Case discussed with oncology who will formally consulted and recommended a multiple myeloma panel and urine light chains and LDH level which were ordered.  Patient placed on gentle hydration.  Patient placed on pain management.  Patient admitted for further evaluation and work-up.   Assessment & Plan:   Principal Problem:   Lytic bone lesions on xray Active Problems:   Type 2 diabetes mellitus with diabetic polyneuropathy, without long-term current use of insulin (HCC)   Hyperlipidemia   GAD (generalized anxiety disorder)   AKI (acute kidney injury) ([place])   Anemia   MSSA bacteremia   Constipation   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)  1 lytic bone lesion/disease-highly suspicious for malignancy versus myeloma versus osteomyelitis -Patient presented with 6-week history of  chest pain.  Patient with recently diagnosed chronic kidney disease in March 2022 per patient. -CT chest with lytic bone disease involving T5 and T6 and possibly the sternum, pathologic fracture of T6 with paraspinal hematoma or tumor, findings suggestive of metastatic bone disease or possibly myeloma. -Radiology recommended MRI of the T and L-spine with and without contrast which was ordered after discussions with Dr. Annamaria Boots, oncology. -CT abdomen and pelvis also ordered to follow-up on any further lesions. -Multiple myeloma panel, urine light chains and LDH level ordered. -Oncology has assessed patient, following and patient possibly may need bone marrow biopsy which could possibly be done on Monday, July 27, 2021.   -IR consultation and evaluation for possible kyphoplasty. -Patient being followed by oncology and per their note states given limited bone involvement and T4/5 without other tissue bone marrow signal on the scan multiple myeloma less likely.  Oncology suspecting thoracic spine bone lesion could likely be to osteomyelitis. -Per oncology slight elevation in both kappa and lambda chains and not supporting diagnosis of multiple myeloma at this time. -SPEP UPEP ordered per hematology/oncology. -Continue empiric IV Ancef as recommended per ID. -Patient seen in consultation per palliative care and patient's long-acting OxyContin has been discontinued and patient placed on transdermal fentanyl patch due to patient's stage III chronic kidney disease and slowly rising serum creatinine.  IV Dilaudid changed to 1 mg every 3 hours as needed. -Palliative care to continue low-dose Xanax, Flexeril, Tylenol, Neurontin. -Oncology and palliative care following and appreciate input and recommendations.  2.  MSSA bacteremia -Patient with MSSA bacteremia noted on blood cultures.   -Patient with a left great toe wound with pus  noted and necrotic area likely source of bacteremia however podiatry doubts this  is the source of bacteremia..   -2D echo done negative for vegetations.   -Patient seen in consultation by ID who are recommending repeat blood cultures, need for TEE (cardiology notified for need for TEE however due to scheduling likely will not be done until Wednesday ).  -Continue antibiotics of IV Ancef.   -ID following and appreciate input and recommendations.   3.  AKI on chronic kidney disease stage IIIa -Baseline creatinine 1.3 from July 2022. -Creatinine elevated on admission at 1.99. -Renal function fluctuating creatinine currently at 2.21 today with no significant change over the past 24 hours.. -Continue to hold diuretics and ARB. -Saline lock IV fluids -Follow.  4.  Diabetes mellitus type 2 -Hemoglobin A1c of 7.29 May 2021. -Patient with long-term complication of retinopathy, neuropathy, microalbuminemia. -Continue to hold home oral hypoglycemic agents.   -SSI.  - -Neurontin.     5.  Hypertension Patient with significantly elevated blood pressure likely pain component on admission. -Continue to hold ARB and Lasix due to AKI. -Continue verapamil, and increase hydralazine to 50 mg every 8 hours. -IV hydralazine as needed systolic blood pressure > 180. -Pain management.  6.  Hyperlipidemia -Statin.  7.  Acute on chronic anemia/low vitamin B12 levels -Hemoglobin noted at baseline to be approximately 9 in July. -Patient presented she had a hemoglobin as low as 7.5 likely secondary to chronic kidney disease versus malignancy. -Patient with no overt bleeding. -Hemoglobin currently at 7.1 this morning -May need IV iron during this hospitalization however will defer to oncology. -Vitamin B12 levels at 252 and as such patient placed on vitamin B12 supplementation of subcutaneous during the hospitalization and transition to oral vitamin B12 on discharge.  -Transfuse 2 units packed red blood cells. - follow H&H.  8.  Constipation -Patient with no bowel  movement. -Continue lactulose twice daily, Senokot-S twice daily, Dulcolax suppositories daily.    9.  Left foot ulcer/wound, poa -Patient seen by wound care RN who assessed wound and noted pain content pus that pulled out from 2 different areas of the toe with a necrotic area noted. -Concern this might be source for patient's MSSA however podiatry doubts if this is the source and podiatry no further immediate work-up needed for the left hallux warranted at this time which ID is in agreement with. -Continue current wound care as recommended by podiatry..     DVT prophylaxis: Continue Heparin Code Status: Full Family Communication: Updated patient and wife at bedside. Disposition:   Status is: Inpatient  Remains inpatient appropriate because:Inpatient level of care appropriate due to severity of illness  Dispo: The patient is from: Home              Anticipated d/c is to: Home              Patient currently is not medically stable to d/c.   Difficult to place patient No       Consultants:  Oncology: Dr. Burr Medico 07/23/2021 ID: Dr. Gale Journey 07/24/2021 Podiatry : Dr. Sherryle Lis 07/24/2021 Palliative care: Dr. Rowe Pavy 07/25/2021  Procedures:  CT chest 07/22/2021 Chest x-ray 07/22/2021 VQ scan 07/22/2021 2D echo 07/24/2021 Transfusion 2 units packed red blood cells   Antimicrobials: IV Ancef 07/23/2021>>>>>>   Subjective: Patient sitting up in bed.  Still with complaints of chest pain.  Some improvement with shortness of breath.  No abdominal pain.  Complaining of hiccups.  Wife and daughter at bedside.  Objective: Vitals:   07/24/21 2238 07/25/21 0004 07/25/21 0528 07/25/21 0636  BP: (!) 156/90 140/81 (!) 173/92 135/90  Pulse: (!) 110 (!) 102 (!) 112 (!) 108  Resp:  18  16  Temp:  99.9 F (37.7 C) 98.8 F (37.1 C)   TempSrc:  Oral Oral   SpO2:  95% 91% 92%  Weight:   87.6 kg   Height:        Intake/Output Summary (Last 24 hours) at 07/25/2021 1124 Last data filed at 07/25/2021  0316 Gross per 24 hour  Intake 1427.91 ml  Output --  Net 1427.91 ml    Filed Weights   07/24/21 0500 07/25/21 0528  Weight: 87.9 kg 87.6 kg    Examination:  General exam: NAD Respiratory system: Lungs clear to auscultation anterior lung fields.  No wheezes, no crackles, no rhonchi.  Normal respiratory effort.   Cardiovascular system: RRR no murmurs rubs or gallops.  No JVD.  No lower extremity edema.  Gastrointestinal system: Abdomen is soft, nontender, nondistended, positive bowel sounds.  No rebound.  No guarding.  Central nervous system: Alert and oriented.  Moving extremities spontaneously.  No focal neurological deficits.  Extremities: Left great toe wound dressing noted.   Skin: No rashes, lesions or ulcers Psychiatry: Judgement and insight appear normal. Mood & affect appropriate.  Data Reviewed: I have personally reviewed following labs and imaging studies  CBC: Recent Labs  Lab 07/22/21 1051 07/23/21 0508 07/24/21 0550 07/24/21 1423 07/25/21 0611  WBC 14.3* 14.4* 13.0*  --  12.9*  NEUTROABS  --   --  9.2*  --  9.4*  HGB 7.5* 8.6* 7.2* 7.7* 7.1*  HCT 23.0* 26.2* 22.1* 23.0* 21.5*  MCV 86.1 81.9 84.4  --  85.0  PLT 405* 371 332  --  321     Basic Metabolic Panel: Recent Labs  Lab 07/22/21 1051 07/23/21 0508 07/24/21 0550 07/25/21 0611  NA 134* 135 131* 135  K 4.8 4.3 4.3 4.5  CL 98 103 101 103  CO2 _0 GLUCOSE 184* 144* 133* 165*  BUN 25* 22* 26* 26*  CREATININE 1.99* 1.38* 2.15* 2.21*  CALCIUM 9.4 9.2 8.4* 8.9  MG  --   --  1.8  --      GFR: Estimated Creatinine Clearance: 44 mL/min (A) (by C-G formula based on SCr of 2.21 mg/dL (H)).  Liver Function Tests: No results for input(s): AST, ALT, ALKPHOS, BILITOT, PROT, ALBUMIN in the last 168 hours.  CBG: Recent Labs  Lab 07/24/21 0824 07/24/21 1223 07/24/21 1712 07/24/21 2101 07/25/21 0740  GLUCAP 133* 123* 208* 189* 147*      Recent Results (from the past 240 hour(s))   Culture, blood (routine x 2)     Status: Abnormal   Collection Time: 07/22/21  2:11 PM   Specimen: BLOOD  Result Value Ref Range Status   Specimen Description   Final    BLOOD RIGHT ANTECUBITAL Performed at [facility] [address] [place], [place] [address]    Special Requests   Final    BOTTLES DRAWN AEROBIC AND ANAEROBIC Blood Culture results may not be optimal due to an excessive volume of blood received in culture bottles Performed at [facility] [address] [place], [place] [address]    Culture  Setup Time   Final    GRAM POSITIVE COCCI IN CLUSTERS IN BOTH AEROBIC AND ANAEROBIC BOTTLES CRITICAL RESULT CALLED TO, READ BACK BY AND  VERIFIED WITH: PHARMD M.SWAYME AT 1547 ON 07/23/2021 BY T.SAAD. Performed at [facility], [place] [address], [place], [place] [address]    Culture STAPHYLOCOCCUS AUREUS (A)  Final   Report Status 07/25/2021 FINAL  Final   Organism ID, Bacteria STAPHYLOCOCCUS AUREUS  Final      Susceptibility   Staphylococcus aureus - MIC*    CIPROFLOXACIN <=0.5 SENSITIVE Sensitive     ERYTHROMYCIN RESISTANT Resistant     GENTAMICIN <=0.5 SENSITIVE Sensitive     OXACILLIN <=0.25 SENSITIVE Sensitive     TETRACYCLINE <=1 SENSITIVE Sensitive     VANCOMYCIN 1 SENSITIVE Sensitive     TRIMETH/SULFA <=10 SENSITIVE Sensitive     CLINDAMYCIN RESISTANT Resistant     RIFAMPIN <=0.5 SENSITIVE Sensitive     Inducible Clindamycin POSITIVE Resistant     * STAPHYLOCOCCUS AUREUS  Blood Culture ID Panel (Reflexed)     Status: Abnormal   Collection Time: 07/22/21  2:11 PM  Result Value Ref Range Status   Enterococcus faecalis NOT DETECTED NOT DETECTED Final   Enterococcus Faecium NOT DETECTED NOT DETECTED Final   Listeria monocytogenes NOT DETECTED NOT DETECTED Final   Staphylococcus species DETECTED (A) NOT DETECTED Final    Comment: CRITICAL RESULT CALLED TO, READ BACK BY AND VERIFIED WITH: PHARMD M.SWAYME AT 1547 ON  07/23/2021 BY T.SAAD.    Staphylococcus aureus (BCID) DETECTED (A) NOT DETECTED Final    Comment: CRITICAL RESULT CALLED TO, READ BACK BY AND VERIFIED WITH: PHARMD M.SWAYME AT 1547 ON 07/23/2021 BY T.SAAD.    Staphylococcus epidermidis NOT DETECTED NOT DETECTED Final   Staphylococcus lugdunensis NOT DETECTED NOT DETECTED Final   Streptococcus species NOT DETECTED NOT DETECTED Final   Streptococcus agalactiae NOT DETECTED NOT DETECTED Final   Streptococcus pneumoniae NOT DETECTED NOT DETECTED Final   Streptococcus pyogenes NOT DETECTED NOT DETECTED Final   A.calcoaceticus-baumannii NOT DETECTED NOT DETECTED Final   Bacteroides fragilis NOT DETECTED NOT DETECTED Final   Enterobacterales NOT DETECTED NOT DETECTED Final   Enterobacter cloacae complex NOT DETECTED NOT DETECTED Final   Escherichia coli NOT DETECTED NOT DETECTED Final   Klebsiella aerogenes NOT DETECTED NOT DETECTED Final   Klebsiella oxytoca NOT DETECTED NOT DETECTED Final   Klebsiella pneumoniae NOT DETECTED NOT DETECTED Final   Proteus species NOT DETECTED NOT DETECTED Final   Salmonella species NOT DETECTED NOT DETECTED Final   Serratia marcescens NOT DETECTED NOT DETECTED Final   Haemophilus influenzae NOT DETECTED NOT DETECTED Final   Neisseria meningitidis NOT DETECTED NOT DETECTED Final   Pseudomonas aeruginosa NOT DETECTED NOT DETECTED Final   Stenotrophomonas maltophilia NOT DETECTED NOT DETECTED Final   Candida albicans NOT DETECTED NOT DETECTED Final   Candida auris NOT DETECTED NOT DETECTED Final   Candida glabrata NOT DETECTED NOT DETECTED Final   Candida krusei NOT DETECTED NOT DETECTED Final   Candida parapsilosis NOT DETECTED NOT DETECTED Final   Candida tropicalis NOT DETECTED NOT DETECTED Final   Cryptococcus neoformans/gattii NOT DETECTED NOT DETECTED Final   Meth resistant mecA/C and MREJ NOT DETECTED NOT DETECTED Final    Comment: Performed at [facility], [address] [place], [place]  [address]  Culture, blood (routine x 2)     Status: None (Preliminary result)   Collection Time: 07/22/21  3:13 PM   Specimen: BLOOD  Result Value Ref Range Status   Specimen Description   Final    BLOOD LEFT ANTECUBITAL Performed at [facility] [address]  [place], [place] [address]    Special Requests   Final    BOTTLES DRAWN AEROBIC ONLY Blood Culture results may not be optimal due to an inadequate volume of blood received in culture bottles Performed at [facility] [address], [place], [place] [address]    Culture   Final    NO GROWTH 3 DAYS Performed at [facility], [place] [address], [place], [place] [address]    Report Status PENDING  Incomplete  SARS CORONAVIRUS 2 (TAT 6-24 HRS) Nasopharyngeal     Status: None   Collection Time: 07/22/21  6:07 PM   Specimen: Nasopharyngeal  Result Value Ref Range Status   SARS Coronavirus 2 NEGATIVE NEGATIVE Final    Comment: (NOTE) SARS-CoV-2 target nucleic acids are NOT DETECTED.  The SARS-CoV-2 RNA is generally detectable in upper and lower respiratory specimens during the acute phase of infection. Negative results do not preclude SARS-CoV-2 infection, do not rule out co-infections with other pathogens, and should not be used as the sole basis for treatment or other patient management decisions. Negative results must be combined with clinical observations, patient history, and epidemiological information. The expected result is Negative.  Fact Sheet for Patients: SugarRoll.be  Fact Sheet for Healthcare Providers: https://www.woods-mathews.com/  This test is not yet approved or cleared by the [place] FDA and  has been authorized for detection and/or diagnosis of SARS-CoV-2 by FDA under an Emergency Use Authorization (EUA). This EUA will remain  in effect (meaning this test can be used) for the duration of the COVID-19 declaration  under Se ction 564(b)(1) of the Act, 21 U.S.C. section 360bbb-3(b)(1), unless the authorization is terminated or revoked sooner.  Performed at [facility], [place] [address] [place], [place] [address]   Culture, blood (routine x 2)     Status: None (Preliminary result)   Collection Time: 07/24/21  5:04 PM   Specimen: BLOOD  Result Value Ref Range Status   Specimen Description   Final    BLOOD RIGHT ANTECUBITAL Performed at [facility] [address] [place], [place] [address]    Special Requests   Final    BOTTLES DRAWN AEROBIC AND ANAEROBIC Blood Culture adequate volume Performed at [facility] [address], [place], [place] [address]    Culture   Final    NO GROWTH < 12 HOURS Performed at [facility] [address], [place], [place] [address]    Report Status PENDING  Incomplete  Culture, blood (routine x 2)     Status: None (Preliminary result)   Collection Time: 07/24/21  5:04 PM   Specimen: BLOOD  Result Value Ref Range Status   Specimen Description   Final    BLOOD LEFT ANTECUBITAL Performed at [facility] [address], [place], [place] [address]    Special Requests   Final    BOTTLES DRAWN AEROBIC AND ANAEROBIC Blood Culture adequate volume Performed at [facility] [address], [place], [place] [address]    Culture   Final    NO GROWTH < 12 HOURS Performed at [facility] [address], [name], [place] [address]    Report Status PENDING  Incomplete          Radiology Studies: CT ABDOMEN PELVIS WO CONTRAST  Result Date: 07/23/2021 CLINICAL DATA:  Cancer of unknown primary, lytic thoracic spine lesions EXAM: CT ABDOMEN AND PELVIS WITHOUT CONTRAST TECHNIQUE: Multidetector CT  imaging of the abdomen and pelvis was performed following the standard protocol without IV contrast. Unenhanced CT was performed per clinician order. Lack of IV contrast limits  sensitivity and specificity, especially for evaluation of abdominal/pelvic solid viscera. COMPARISON:  07/22/2021 FINDINGS: Lower chest: Hypoventilatory changes at the dependent lower lobes. No acute pleural or parenchymal lung disease. Hepatobiliary: No gross liver abnormalities on this unenhanced exam. Gallbladder is moderately distended without cholelithiasis or cholecystitis. Pancreas: Unremarkable. No pancreatic ductal dilatation or surrounding inflammatory changes. Spleen: Normal in size without focal abnormality. Adrenals/Urinary Tract: No urinary tract calculi or obstructive uropathy. Excreted contrast within the bladder consistent with preceding MRI. The adrenals are unremarkable. Stomach/Bowel: No bowel obstruction or ileus. Moderate retained stool throughout the colon. The appendix is surgically absent. No bowel wall thickening or inflammatory change. Vascular/Lymphatic: Aortic atherosclerosis. No enlarged abdominal or pelvic lymph nodes. Reproductive: Prostate is unremarkable. Other: No free fluid or free gas. Small fat containing bilateral inguinal hernias. No bowel herniation. Musculoskeletal: There are no acute or destructive bony lesions. Reconstructed images demonstrate no additional findings. IMPRESSION: 1. No acute intra-abdominal or intrapelvic process. 2. Moderate fecal retention consistent with constipation. 3.  Aortic Atherosclerosis (ICD10-I70.0). Electronically Signed   By: Randa Ngo M.D.   On: 07/23/2021 22:26   MR THORACIC SPINE W WO CONTRAST  Result Date: 07/23/2021 CLINICAL DATA:  Bone lesion, T-spine, malignancy suspected; Bone lesion, L/S-spine, malignancy suspected EXAM: MRI THORACIC AND LUMBAR SPINE WITHOUT AND WITH CONTRAST TECHNIQUE: Multiplanar and multiecho pulse sequences of the thoracic and lumbar spine were obtained without and with intravenous contrast. CONTRAST:  71m GADAVIST GADOBUTROL 1 MMOL/ML IV SOLN COMPARISON:  None. FINDINGS: MRI THORACIC SPINE FINDINGS  Alignment:  Physiologic. Vertebrae: There is abnormal bone marrow signal and contrast enhancement at T4 and T5. There is mild edema of the disc space. There is a minimally depressed fracture of the superior T5 endplate. Cord:  Normal signal and morphology. Paraspinal and other soft tissues: There is phlegmon like contrast enhancement of the prevertebral soft tissues at the T4 and T5 levels. No epidural abscess. Disc levels: There is no spinal canal or neural foraminal stenosis. MRI LUMBAR SPINE FINDINGS Segmentation:  Standard. Alignment:  Physiologic. Vertebrae:  No fracture, evidence of discitis, or bone lesion. Conus medullaris: Extends to the L1 level and appears normal. Paraspinal and other soft tissues: Negative. Disc levels: No spinal canal or neural foraminal stenosis. IMPRESSION: 1. Abnormal bone marrow signal and contrast enhancement at the T4 and T5 levels with mild edema of the disc space and prevertebral phlegmon/hematoma. The findings could indicate osseous metastatic disease, multiple myeloma or discitis-osteomyelitis. 2. Minimally depressed compression fracture of T5 without retropulsion. 3. No epidural abscess. 4. No spinal canal or neural foraminal stenosis. Electronically Signed   By: KUlyses JarredM.D.   On: 07/23/2021 19:55   MR Lumbar Spine W Wo Contrast  Result Date: 07/23/2021 CLINICAL DATA:  Bone lesion, T-spine, malignancy suspected; Bone lesion, L/S-spine, malignancy suspected EXAM: MRI THORACIC AND LUMBAR SPINE WITHOUT AND WITH CONTRAST TECHNIQUE: Multiplanar and multiecho pulse sequences of the thoracic and lumbar spine were obtained without and with intravenous contrast. CONTRAST:  153mGADAVIST GADOBUTROL 1 MMOL/ML IV SOLN COMPARISON:  None. FINDINGS: MRI THORACIC SPINE FINDINGS Alignment:  Physiologic. Vertebrae: There is abnormal bone marrow signal and contrast enhancement at T4 and T5. There is mild edema of the disc space. There is a minimally depressed fracture of the superior  T5 endplate. Cord:  Normal signal and morphology. Paraspinal and other  soft tissues: There is phlegmon like contrast enhancement of the prevertebral soft tissues at the T4 and T5 levels. No epidural abscess. Disc levels: There is no spinal canal or neural foraminal stenosis. MRI LUMBAR SPINE FINDINGS Segmentation:  Standard. Alignment:  Physiologic. Vertebrae:  No fracture, evidence of discitis, or bone lesion. Conus medullaris: Extends to the L1 level and appears normal. Paraspinal and other soft tissues: Negative. Disc levels: No spinal canal or neural foraminal stenosis. IMPRESSION: 1. Abnormal bone marrow signal and contrast enhancement at the T4 and T5 levels with mild edema of the disc space and prevertebral phlegmon/hematoma. The findings could indicate osseous metastatic disease, multiple myeloma or discitis-osteomyelitis. 2. Minimally depressed compression fracture of T5 without retropulsion. 3. No epidural abscess. 4. No spinal canal or neural foraminal stenosis. Electronically Signed   By: Ulyses Jarred M.D.   On: 07/23/2021 19:55   ECHOCARDIOGRAM COMPLETE  Result Date: 07/24/2021    ECHOCARDIOGRAM REPORT   Patient Name:   Jose [name] Date of Exam: 07/24/2021 Medical Rec #:  [id number]       Height:       71.0 in Accession #:    [id number]      Weight:       193.8 lb Date of Birth:  [birth year]       BSA:          2.081 m Patient Age:    53 years        BP:           139/81 mmHg Patient Gender: M               HR:           111 bpm. Exam Location:  Inpatient Procedure: 2D Echo, Color Doppler and Cardiac Doppler Indications:    Bacteremia  History:        Patient has no prior history of Echocardiogram examinations.                 Risk Factors:Diabetes and Dyslipidemia.  Sonographer:    Raquel Sarna Senior RDCS Referring Phys: [id number] Malachy Moan   Sonographer Comments: Chest wall very tender to the touch, exam performed with minimal probe pressure for patient comfort. Wall motion obtained from short axis.  IMPRESSIONS  1. Left ventricular ejection fraction, by estimation, is >75%. The left ventricle has hyperdynamic function. The left ventricle has no regional wall motion abnormalities. Left ventricular diastolic parameters are indeterminate.  2. Right ventricular systolic function is normal. The right ventricular size is normal. There is normal pulmonary artery systolic pressure. The estimated right ventricular systolic pressure is 37.8 mmHg.  3. The mitral valve is grossly normal. No evidence of mitral valve regurgitation.  4. The aortic valve is tricuspid. Aortic valve regurgitation is not visualized.  5. The inferior vena cava is normal in size with <50% respiratory variability, suggesting right atrial pressure of 8 mmHg. Comparison(s): No prior Echocardiogram. Conclusion(s)/Recommendation(s): No evidence of valvular vegetations on this transthoracic echocardiogram, however the right-sided valves are poorly visualized. Would recommend a transesophageal echocardiogram to exclude infective endocarditis if clinically indicated. FINDINGS  Left Ventricle: Left ventricular ejection fraction, by estimation, is >75%. The left ventricle has hyperdynamic function. The left ventricle has no regional wall motion abnormalities. The left ventricular internal cavity size was small. There is no left  ventricular hypertrophy. Left ventricular diastolic parameters are indeterminate. Right Ventricle: The right ventricular size is normal. No increase in right ventricular wall thickness. Right ventricular systolic function is  normal. There is normal pulmonary artery systolic pressure. The tricuspid regurgitant velocity is 2.59 m/s, and  with an assumed right atrial pressure of 8 mmHg, the estimated right ventricular systolic pressure is 97.0 mmHg. Left Atrium: Left atrial size was normal in size. Right Atrium: Right atrial size was normal in size. Pericardium: There is no evidence of pericardial effusion. Mitral Valve: The mitral  valve is grossly normal. No evidence of mitral valve regurgitation. Tricuspid Valve: The tricuspid valve is grossly normal. Tricuspid valve regurgitation is trivial. Aortic Valve: The aortic valve is tricuspid. Aortic valve regurgitation is not visualized. Pulmonic Valve: The pulmonic valve was normal in structure. Pulmonic valve regurgitation is not visualized. Aorta: The aortic root and ascending aorta are structurally normal, with no evidence of dilitation. Venous: The inferior vena cava is normal in size with less than 50% respiratory variability, suggesting right atrial pressure of 8 mmHg. IAS/Shunts: No atrial level shunt detected by color flow Doppler.  LEFT VENTRICLE PLAX 2D LVIDd:         3.70 cm LVIDs:         2.15 cm LV PW:         1.15 cm LV IVS:        0.90 cm LVOT diam:     1.90 cm LV SV:         56 LV SV Index:   27 LVOT Area:     2.84 cm  RIGHT VENTRICLE RV S prime:     27.80 cm/s TAPSE (M-mode): 3.6 cm LEFT ATRIUM           Index LA diam:      3.25 cm 1.56 cm/m LA Vol (A4C): 28.3 ml 13.60 ml/m  AORTIC VALVE LVOT Vmax:   123.00 cm/s LVOT Vmean:  95.500 cm/s LVOT VTI:    0.197 m  AORTA Ao Root diam: 3.70 cm TRICUSPID VALVE TR Peak grad:   26.8 mmHg TR Vmax:        259.00 cm/s  SHUNTS Systemic VTI:  0.20 m Systemic Diam: 1.90 cm Lyman Bishop MD Electronically signed by Lyman Bishop MD Signature Date/Time: 07/24/2021/1:53:34 PM    Final         Scheduled Meds:  acetaminophen  650 mg Oral Once   atorvastatin  40 mg Oral Daily   bisacodyl  10 mg Rectal Daily   cyanocobalamin  1,000 mcg Subcutaneous Daily   cyclobenzaprine  10 mg Oral BID   diphenhydrAMINE  25 mg Oral Once   feeding supplement  237 mL Oral BID BM   fentaNYL  1 patch Transdermal Q72H   furosemide  20 mg Intravenous Once   gabapentin  200 mg Oral TID   heparin  5,000 Units Subcutaneous Q8H   hydrALAZINE  25 mg Oral Q8H   insulin aspart  0-5 Units Subcutaneous QHS   insulin aspart  0-9 Units Subcutaneous TID WC    lactulose  20 g Oral BID   multivitamin with minerals  1 tablet Oral Daily   mupirocin ointment  1 application Topical BID   senna-docusate  1 tablet Oral BID   traZODone  100 mg Oral QHS   verapamil  120 mg Oral Daily   Continuous Infusions:   ceFAZolin (ANCEF) IV 2 g (07/25/21 0526)     LOS: 3 days    Time spent: 40 minutes    Irine Seal, MD [facility]   To contact the attending provider between 7A-7P or the covering provider during after hours 7P-7A,  please log into the web site [email or web address] and access using universal [place] password for that web site. If you do not have the password, please call the hospital operator.  07/25/2021, 11:24 AM

## 2021-07-26 DIAGNOSIS — E11621 Type 2 diabetes mellitus with foot ulcer: Secondary | ICD-10-CM | POA: Diagnosis not present

## 2021-07-26 DIAGNOSIS — K5900 Constipation, unspecified: Secondary | ICD-10-CM | POA: Diagnosis not present

## 2021-07-26 DIAGNOSIS — D649 Anemia, unspecified: Secondary | ICD-10-CM | POA: Diagnosis not present

## 2021-07-26 DIAGNOSIS — M899 Disorder of bone, unspecified: Secondary | ICD-10-CM | POA: Diagnosis not present

## 2021-07-26 DIAGNOSIS — R52 Pain, unspecified: Secondary | ICD-10-CM | POA: Diagnosis not present

## 2021-07-26 DIAGNOSIS — F411 Generalized anxiety disorder: Secondary | ICD-10-CM

## 2021-07-26 DIAGNOSIS — N179 Acute kidney failure, unspecified: Secondary | ICD-10-CM | POA: Diagnosis not present

## 2021-07-26 DIAGNOSIS — L97529 Non-pressure chronic ulcer of other part of left foot with unspecified severity: Secondary | ICD-10-CM | POA: Diagnosis not present

## 2021-07-26 LAB — CBC WITH DIFFERENTIAL/PLATELET
Abs Immature Granulocytes: 0.08 10*3/uL — ABNORMAL HIGH (ref 0.00–0.07)
Basophils Absolute: 0.1 10*3/uL (ref 0.0–0.1)
Basophils Relative: 0 %
Eosinophils Absolute: 0.3 10*3/uL (ref 0.0–0.5)
Eosinophils Relative: 2 %
HCT: 24.1 % — ABNORMAL LOW (ref 39.0–52.0)
Hemoglobin: 8.1 g/dL — ABNORMAL LOW (ref 13.0–17.0)
Immature Granulocytes: 1 %
Lymphocytes Relative: 7 %
Lymphs Abs: 0.9 10*3/uL (ref 0.7–4.0)
MCH: 28.5 pg (ref 26.0–34.0)
MCHC: 33.6 g/dL (ref 30.0–36.0)
MCV: 84.9 fL (ref 80.0–100.0)
Monocytes Absolute: 2 10*3/uL — ABNORMAL HIGH (ref 0.1–1.0)
Monocytes Relative: 15 %
Neutro Abs: 10.4 10*3/uL — ABNORMAL HIGH (ref 1.7–7.7)
Neutrophils Relative %: 75 %
Platelets: 310 10*3/uL (ref 150–400)
RBC: 2.84 MIL/uL — ABNORMAL LOW (ref 4.22–5.81)
RDW: 13 % (ref 11.5–15.5)
WBC: 13.7 10*3/uL — ABNORMAL HIGH (ref 4.0–10.5)
nRBC: 0 % (ref 0.0–0.2)

## 2021-07-26 LAB — RENAL FUNCTION PANEL
Albumin: 2.4 g/dL — ABNORMAL LOW (ref 3.5–5.0)
Anion gap: 10 (ref 5–15)
BUN: 25 mg/dL — ABNORMAL HIGH (ref 6–20)
CO2: 24 mmol/L (ref 22–32)
Calcium: 9.3 mg/dL (ref 8.9–10.3)
Chloride: 103 mmol/L (ref 98–111)
Creatinine, Ser: 2.02 mg/dL — ABNORMAL HIGH (ref 0.61–1.24)
GFR, Estimated: 40 mL/min — ABNORMAL LOW (ref >60)
Glucose, Bld: 222 mg/dL — ABNORMAL HIGH (ref 70–99)
Phosphorus: 4 mg/dL (ref 2.5–4.6)
Potassium: 4.4 mmol/L (ref 3.5–5.1)
Sodium: 137 mmol/L (ref 135–145)

## 2021-07-26 LAB — GLUCOSE, CAPILLARY
Glucose-Capillary: 207 mg/dL — ABNORMAL HIGH (ref 70–99)
Glucose-Capillary: 169 mg/dL — ABNORMAL HIGH (ref 70–99)

## 2021-07-26 MED ORDER — DOCUSATE SODIUM 283 MG RE ENEM
1.0000 | ENEMA | Freq: Once | RECTAL | Status: AC
  Administered 2021-07-26: 283 mg via RECTAL
  Filled 2021-07-26: qty 1

## 2021-07-26 MED ORDER — AMLODIPINE BESYLATE 5 MG PO TABS
5.0000 mg | ORAL_TABLET | Freq: Every day | ORAL | Status: DC
  Administered 2021-07-26 – 2021-07-27 (×2): 5 mg via ORAL
  Filled 2021-07-26 (×2): qty 1

## 2021-07-26 MED ORDER — HYDROMORPHONE HCL 1 MG/ML IJ SOLN
1.0000 mg | INTRAMUSCULAR | Status: DC | PRN
Start: 2021-07-26 — End: 2021-07-31
  Administered 2021-07-27 – 2021-07-31 (×15): 1 mg via INTRAVENOUS
  Filled 2021-07-26 (×15): qty 1

## 2021-07-26 NOTE — Progress Notes (Signed)
Daily Progress Note   Patient Name: Jose [name]       Date: 07/26/2021 DOB: November 12, 1975  Age: 45 y.o. MRN#: [id number] Attending Physician: Eugenie Filler, MD Primary Care Physician: [name], NP Admit Date: 07/22/2021  Reason for Consultation/Follow-up: Non pain symptom management and Pain control  Subjective:  Patient states he did not sleep well last night. He awakens easily and answers questions appropriately. He is not sure when the TEE and bone marrow biopsy are going to be done.  Chart reviewed.  Medication history noted.    Length of Stay: 4  Current Medications: Scheduled Meds:  . amLODipine  5 mg Oral Daily  . atorvastatin  40 mg Oral Daily  . bisacodyl  10 mg Rectal Daily  . cyanocobalamin  1,000 mcg Subcutaneous Daily  . cyclobenzaprine  10 mg Oral BID  . feeding supplement  237 mL Oral BID BM  . fentaNYL  1 patch Transdermal Q72H  . gabapentin  200 mg Oral TID  . heparin  5,000 Units Subcutaneous Q8H  . hydrALAZINE  50 mg Oral Q8H  . insulin aspart  0-5 Units Subcutaneous QHS  . insulin aspart  0-9 Units Subcutaneous TID WC  . lactulose  20 g Oral BID  . multivitamin with minerals  1 tablet Oral Daily  . mupirocin ointment  1 application Topical BID  . senna-docusate  1 tablet Oral BID  . traZODone  100 mg Oral QHS  . verapamil  120 mg Oral Daily    Continuous Infusions: .  ceFAZolin (ANCEF) IV 2 g (07/26/21 0528)  . chlorproMAZINE (THORAZINE) IV 25 mg (07/26/21 0854)    PRN Meds: acetaminophen, albuterol, ALPRAZolam, chlorproMAZINE (THORAZINE) IV, hydrALAZINE, HYDROmorphone (DILAUDID) injection, oxyCODONE  Physical Exam         Resting in bed Regular work of breathing No distress Asleep but is able to awaken easily Answers questions  appropriately Abdomen not distended No edema  Vital Signs: BP (!) 162/96 (BP Location: Right Arm)   Pulse (!) 103   Temp 98.6 F (37 C) (Oral)   Resp 17   Ht 5' 11"  (1.803 m)   Wt 84.8 kg   SpO2 94%   BMI 26.08 kg/m  SpO2: SpO2: 94 % O2 Device: O2 Device: Nasal Cannula O2 Flow Rate: O2 Flow Rate (L/min):  2 L/min  Intake/output summary:  Intake/Output Summary (Last 24 hours) at 07/26/2021 1233 Last data filed at 07/26/2021 3383 Gross per 24 hour  Intake 836 ml  Output --  Net 836 ml   LBM: Last BM Date: 07/21/21 Baseline Weight: Weight: 87.9 kg Most recent weight: Weight: 84.8 kg       Palliative Assessment/Data:      Patient Active Problem List   Diagnosis Date Noted  . Diabetic ulcer of left great toe ([place])   . MSSA bacteremia 07/23/2021  . Constipation 07/23/2021  . Lytic bone lesions on xray 07/22/2021  . AKI (acute kidney injury) ([place]) 05/19/2021  . Anemia 05/19/2021  . Nausea and vomiting 05/19/2021  . Elevated blood pressure reading 05/19/2021  . COVID-19 05/18/2021  . Bilateral carpal tunnel syndrome 01/23/2020  . Numbness 12/12/2019  . Microalbuminuria due to type 2 diabetes mellitus ([place]) 07/20/2019  . GAD (generalized anxiety disorder) 06/18/2019  . Adult ADHD 06/18/2019  . Trigger middle finger of left hand 06/11/2019  . Diabetic retinopathy ([place]) 07/13/2018  . Type 2 diabetes mellitus with diabetic polyneuropathy, without long-term current use of insulin ([place]) 05/17/2018  . Tobacco use disorder 05/17/2018  . Hyperlipidemia 05/17/2018    Palliative Care Assessment & Plan   Patient Profile:    Assessment:  45 y.o. male  with past medical history of type 2 diabetes with retinopathy, neuropathy, microalbuminemia, history of hypertension dyslipidemia stage III chronic kidney disease, COVID-19 infection in July 2022 admitted on 07/22/2021 with chest pain radiating to bilateral shoulder blades for 6 weeks.    Patient presented to the emergency  department and was admitted to hospital medicine service.  Patient was found to have CT chest with lytic bone lesions involving T5 and T6 as well as the sternum.  There was concern for pathologic fracture of T5.  Multiple myeloma is in the differential diagnosis.  Found to have MSSA bacteremia. Patient is being followed by medical oncology and infectious disease.  He was also seen by podiatry because of left hallux ulcer.  Patient was seen by interventional radiology and is to undergo bone marrow biopsy on 07-27-2021.  He continues on antibiotics. Palliative consultation for pain management has been requested.  Recommendations/Plan: Continue fentanyl patch and IV Dilaudid PRN.  Monitor overall condition, hospital course and disease trajectory of illness.  Patient had a bowel movement, continue current bowel regimen.    Goals of Care and Additional Recommendations: Limitations on Scope of Treatment: Full Scope Treatment  Code Status:    Code Status Orders  (From admission, onward)           Start     Ordered   07/22/21 1845  Full code  Continuous        07/22/21 1844           Code Status History     Date Active Date Inactive Code Status Order ID Comments User Context   05/19/2021 0418 05/21/2021 1847 Full Code [id number]  Tacey Ruiz, MD Inpatient       Prognosis:  Unable to determine  Discharge Planning: To Be Determined  Care plan was discussed with patient.   Thank you for allowing the Palliative Medicine Team to assist in the care of this patient.   Time In: 12 Time Out: 12.25 Total Time 25 Prolonged Time Billed  no       Greater than 50%  of this time was spent counseling and coordinating care related to the above assessment and plan.  Loistine Chance, MD  Please contact Palliative Medicine Team phone at [phone] for questions and concerns.

## 2021-07-26 NOTE — Progress Notes (Signed)
  Subjective:  Patient ID: Jose [name], male    DOB: [birth year],  MRN: [id number]  Feeling tired and sleepy today, still having pain, foot feels well  Negative for chest pain and shortness of breath Fever: no Night sweats: no Constitutional signs: Feels tired fatigued Objective:   Vitals:   07/26/21 0518 07/26/21 1413  BP: (!) 162/96 (!) 143/90  Pulse: (!) 103 (!) 103  Resp: 17 16  Temp: 98.6 F (37 C) 98.1 F (36.7 C)  SpO2: 94% 96%   General AA&O x3. Normal mood and affect.  Vascular Dorsalis pedis and posterior tibial pulses 2/4 bilat. Brisk capillary refill to all digits. Pedal hair present.  Neurologic Epicritic sensation grossly absent.  Dermatologic Wound left medial hallux still stable with overlying hyperkeratosis, partial-thickness in nature no exposed subcutaneous tissue bone tendon or joint, no purulence or cellulitis  Orthopedic: MMT 5/5 in dorsiflexion, plantarflexion, inversion, and eversion. Normal joint ROM without pain or crepitus.       Assessment & Plan:  Patient was evaluated and treated and all questions answered.  Left hallux neuropathic ulcer -Wound continues to improve and be quite stable.  It does not penetrate the deep layers of the subcutaneous tissue.  I seriously doubt this is the source of his bacteremia, however if other sources explored in the foot remains a question MRI would be reasonable to rule out osteomyelitis or abscess, he does not have any clinical signs of this currently -Continue daily dressing changes with mupirocin ointment by nursing -We will continue to follow  Criselda Peaches, DPM  Accessible via secure chat for questions or concerns.

## 2021-07-26 NOTE — Progress Notes (Addendum)
TEE requested by Dr. Grandville Silos yesterday. Originally it was felt schedule may be full Mon/Tues but there is slight chance of cancellation opening up a spot. Order placed to keep NPO after midnight in case this can be scheduled tomorrow. Message sent to [employer] inbox to review scheduling plans in AM.

## 2021-07-26 NOTE — Evaluation (Signed)
Physical Therapy Evaluation Patient Details Name: Jose [name] MRN: [id number] DOB: 07/05/1976 Today's Date: 07/26/2021  History of Present Illness  Patient 45 year old gentleman history of type 2 diabetes with retinopathy, neuropathy, microalbuminemia, hypertension, hyperlipidemia, chronic kidney disease stage III (baseline 1.1-1.2), COVID-19 infection July 2022 presented to the ED with complaints of chest pain radiating to bilateral shoulder blades x6 weeks. CT chest without contrast with lytic bone lesions involving T5 and T6 and the sternum, concern for pathologic fracture of T6 with paraspinal hematoma or tumor could represent metastatic bone disease or possibly myeloma.  Clinical Impression  Pt admitted with above diagnosis. Pt currently with functional limitations due to the deficits listed below (see PT Problem List). Pt will benefit from skilled PT to increase their independence and safety with mobility to allow discharge to the venue listed below. Pt with 8/10 pain in thoracic region throughout session.  Will continued to assess his mobility and medical issues are managed.  At this point recommend outpatient PT, but this may change.      Recommendations for follow up therapy are one component of a multi-disciplinary discharge planning process, led by the attending physician.  Recommendations may be updated based on patient status, additional functional criteria and insurance authorization.  Follow Up Recommendations Outpatient PT    Equipment Recommendations  Other (comment) (to be determined)    Recommendations for Other Services       Precautions / Restrictions Precautions Precautions: Other (comment) Precaution Comments: no formal sternal precautions, but would encourage them based on CT Restrictions Weight Bearing Restrictions: No      Mobility  Bed Mobility Overal bed mobility: Needs Assistance Bed Mobility: Rolling;Sidelying to Sit Rolling: Min guard Sidelying to  sit: Min assist       General bed mobility comments: given a pillow for bracing and needed some A to get trunk upright so he could hold the pillow    Transfers Overall transfer level: Needs assistance Equipment used: 2 person hand held assist Transfers: Sit to/from [facility] Sit to Stand: Min guard Stand pivot transfers: Min assist;+2 physical assistance       General transfer comment: HHA of 2 for SPT with MIN A.  Pain continued throughout transfer.  Set up with lunch once sitting.  Ambulation/Gait             General Gait Details: Too fatigued and in pain for gait, although nurse stated he has walked to the bathroom before.  Stairs            Wheelchair Mobility    Modified Rankin (Stroke Patients Only)       Balance Overall balance assessment: Needs assistance   Sitting balance-Leahy Scale: Fair     Standing balance support: Bilateral upper extremity supported Standing balance-Leahy Scale: Fair                               Pertinent Vitals/Pain Pain Assessment: 0-10 Pain Score: 8  Pain Location: chest and shoulder blades Pain Descriptors / Indicators: Grimacing;Guarding;Constant Pain Intervention(s): Limited activity within patient's tolerance;Repositioned;Premedicated before session;Monitored during session    [place] expects to be discharged to:: Private residence Living Arrangements: Spouse/significant other;Children Available Help at Discharge: Family Type of Home: House Home Access: Stairs to enter   Technical brewer of Steps: 2 Home Layout: One level Home Equipment: Other (comment) (walking stick)      Prior Function Level of Independence: Independent  Comments: Works for [employer]        Extremity/Trunk Assessment   Upper Extremity Assessment Upper Extremity Assessment: Defer to OT evaluation    Lower Extremity Assessment Lower  Extremity Assessment: LLE deficits/detail;RLE deficits/detail RLE Deficits / Details: pain in thoracic region with heel slide RLE: Unable to fully assess due to pain LLE Deficits / Details: wound L hallux, pain in thoracic region with heel slide LLE: Unable to fully assess due to pain       Communication   Communication: No difficulties  Cognition Arousal/Alertness: Awake/alert Behavior During Therapy: WFL for tasks assessed/performed Overall Cognitive Status: Within Functional Limits for tasks assessed                                        General Comments General comments (skin integrity, edema, etc.): noted a spot on R upper back with some weeping, nursing informed.  moves very guarded    Exercises     Assessment/Plan    PT Assessment Patient needs continued PT services  PT Problem List Decreased strength;Decreased activity tolerance;Decreased balance;Decreased mobility;Pain       PT Treatment Interventions DME instruction;Gait training;Functional mobility training;Therapeutic activities;Therapeutic exercise;Balance training;Stair training    PT Goals (Current goals can be found in the Care Plan section)  Acute Rehab PT Goals Patient Stated Goal: decrease pain PT Goal Formulation: With patient Time For Goal Achievement: 08/09/21 Potential to Achieve Goals: Good    Frequency Min 3X/week   Barriers to discharge        Co-evaluation PT/OT/SLP Co-Evaluation/Treatment: Yes Reason for Co-Treatment: For patient/therapist safety PT goals addressed during session: Mobility/safety with mobility;Balance         AM-PAC PT "6 Clicks" Mobility  Outcome Measure Help needed turning from your back to your side while in a flat bed without using bedrails?: A Little Help needed moving from lying on your back to sitting on the side of a flat bed without using bedrails?: A Little Help needed moving to and from a bed to a chair (including a wheelchair)?: A  Little Help needed standing up from a chair using your arms (e.g., wheelchair or bedside chair)?: A Little Help needed to walk in hospital room?: A Lot Help needed climbing 3-5 steps with a railing? : A Lot 6 Click Score: 16    End of Session   Activity Tolerance: Patient limited by fatigue;Patient limited by pain Patient left: in chair;with call bell/phone within reach Nurse Communication: Mobility status PT Visit Diagnosis: Pain;Other abnormalities of gait and mobility (R26.89)    Time: [id number] PT Time Calculation (min) (ACUTE ONLY): 20 min   Charges:   PT Evaluation $PT Eval Moderate Complexity: 1 Mod          Adleigh Mcmasters L. Tamala Julian, [place]  07/26/2021   Galen Manila 07/26/2021, 4:14 PM

## 2021-07-26 NOTE — Evaluation (Signed)
Occupational Therapy Evaluation Patient Details Name: Jose [name] MRN: [id number] DOB: [birth year] Today's Date: 07/26/2021   History of Present Illness Patient 45 year old gentleman history of type 2 diabetes with retinopathy, neuropathy, microalbuminemia, hypertension, hyperlipidemia, chronic kidney disease stage III (baseline 1.1-1.2), COVID-19 infection July 2022 presented to the ED with complaints of chest pain radiating to bilateral shoulder blades x6 weeks. CT chest without contrast with lytic bone lesions involving T5 and T6 and the sternum, concern for pathologic fracture of T6 with paraspinal hematoma or tumor could represent metastatic bone disease or possibly myeloma.   Clinical Impression   Patient is a 45 year old male who was admitted for above. Patient was living at home with wife and children and working full time prior level. Currently, patient has increased pain with all movement with need for min A x2 for support for transfers. Patient would continue to benefit from skilled OT services at this time while admitted and after d/c to address noted deficits in order to improve overall safety and independence in ADLs.        Recommendations for follow up therapy are one component of a multi-disciplinary discharge planning process, led by the attending physician.  Recommendations may be updated based on patient status, additional functional criteria and insurance authorization.   Follow Up Recommendations  Home health OT;Supervision/Assistance - 24 hour (pending progress during acute stay)    Equipment Recommendations  Other (comment) (to be determined)    Recommendations for Other Services       Precautions / Restrictions Precautions Precautions: Other (comment) Precaution Comments: no formal sternal precautions, but would encourage them based on CT Restrictions Weight Bearing Restrictions: No      Mobility Bed Mobility Overal bed mobility: Needs Assistance Bed  Mobility: Rolling;Sidelying to Sit Rolling: Min guard Sidelying to sit: Min assist       General bed mobility comments: given a pillow for bracing and needed some A to get trunk upright so he could hold the pillow. patient reported decreased pain with use of pillow.    Transfers Overall transfer level: Needs assistance Equipment used: 2 person hand held assist Transfers: Sit to/from [facility] Sit to Stand: Min guard Stand pivot transfers: Min assist;+2 physical assistance       General transfer comment: patient was noted to have increased pain with transfer.    Balance Overall balance assessment: Needs assistance   Sitting balance-Leahy Scale: Fair     Standing balance support: Bilateral upper extremity supported Standing balance-Leahy Scale: Poor                             ADL either performed or assessed with clinical judgement   ADL Overall ADL's : Needs assistance/impaired Eating/Feeding: Set up;Sitting Eating/Feeding Details (indicate cue type and reason): patient reported increased difficulty with movement of BUE but declined for therapist to assist with meal at this time. patient declined for nursing to assist as well. patietn declined for assist with set up of meal to save energy as well. Grooming: Wash/dry face;Oral care;Moderate assistance;Sitting   Upper Body Bathing: Bed level;Minimal assistance   Lower Body Bathing: Maximal assistance;Bed level   Upper Body Dressing : Sitting;Moderate assistance   Lower Body Dressing: Bed level;Moderate assistance   Toilet Transfer: Minimal assistance;+2 for safety/equipment;+2 for physical assistance Toilet Transfer Details (indicate cue type and reason): with hand hedl assist to transfer to recliner in room Toileting- Clothing Manipulation and Hygiene: Maximal assistance;Sit  to/from stand Toileting - Water quality scientist Details (indicate cue type and reason): patient's pain limiting  standing tolerance and participation in ADL tasks.     Functional mobility during ADLs: Minimal assistance;+2 for safety/equipment;+2 for physical assistance       Vision Baseline Vision/History: 1 Wears glasses Ability to See in Adequate Light: 1 Impaired       Perception     Praxis      Pertinent Vitals/Pain Pain Assessment: 0-10 Pain Score: 8  Pain Location: chest and shoulder blades Pain Descriptors / Indicators: Grimacing;Guarding;Constant Pain Intervention(s): Limited activity within patient's tolerance;Monitored during session;Premedicated before session;Repositioned     Hand Dominance Right   Extremity/Trunk Assessment Upper Extremity Assessment Upper Extremity Assessment: Generalized weakness (ROM not fully tested secondary to increased pain in sternum with movement and encouragemnt of sternal precautions)   Lower Extremity Assessment Lower Extremity Assessment: LLE deficits/detail;RLE deficits/detail RLE Deficits / Details: pain in thoracic region with heel slide RLE: Unable to fully assess due to pain LLE Deficits / Details: wound L hallux, pain in thoracic region with heel slide LLE: Unable to fully assess due to pain   Cervical / Trunk Assessment Cervical / Trunk Assessment: Normal   Communication Communication Communication: No difficulties   Cognition Arousal/Alertness: Awake/alert Behavior During Therapy: WFL for tasks assessed/performed Overall Cognitive Status: Within Functional Limits for tasks assessed                                     General Comments  noted a spot on R upper back with some weeping, nursing informed.  moves very guarded    Exercises     Shoulder Instructions      Home Living Family/patient expects to be discharged to:: Private residence Living Arrangements: Spouse/significant other;Children Available Help at Discharge: Family Type of Home: House Home Access: Stairs to enter Technical brewer of  Steps: 2   Home Layout: One level     Bathroom Shower/Tub: Teacher, early years/pre: Standard     Home Equipment: Other (comment)          Prior Functioning/Environment Level of Independence: Independent        Comments: Works for [employer]        OT Problem List:        OT Treatment/Interventions: Self-care/ADL training;DME and/or AE instruction;Therapeutic activities;Balance training;Patient/family education    OT Goals(Current goals can be found in the care plan section) Acute Rehab OT Goals Patient Stated Goal: decrease pain OT Goal Formulation: With patient Time For Goal Achievement: 08/09/21 Potential to Achieve Goals: Good  OT Frequency: Min 2X/week   Barriers to D/C: Decreased caregiver support          Co-evaluation PT/OT/SLP Co-Evaluation/Treatment: Yes Reason for Co-Treatment: For patient/therapist safety PT goals addressed during session: Mobility/safety with mobility OT goals addressed during session: ADL's and self-care      AM-PAC OT "6 Clicks" Daily Activity     Outcome Measure Help from another person eating meals?: A Little Help from another person taking care of personal grooming?: A Lot Help from another person toileting, which includes using toliet, bedpan, or urinal?: A Lot Help from another person bathing (including washing, rinsing, drying)?: A Lot Help from another person to put on and taking off regular upper body clothing?: A Little Help from another person to put on and taking off regular lower body clothing?: A Lot 6 Click Score:  14   End of Session Nurse Communication: Mobility status  Activity Tolerance: Patient limited by fatigue;Patient limited by pain Patient left: in chair;with call bell/phone within reach  OT Visit Diagnosis: Unsteadiness on feet (R26.81);Pain Pain - part of body:  (sternum)                Time: [id number] OT Time Calculation (min): 20 min Charges:  OT General Charges $OT Visit: 1  Visit OT Evaluation $OT Eval Moderate Complexity: 1 Mod  Jackelyn Poling OTR/L, MS Acute Rehabilitation Department Office# [phone] Pager# [phone]   [place] 07/26/2021, 4:44 PM

## 2021-07-26 NOTE — Progress Notes (Signed)
PROGRESS NOTE    Jose [name]  KPT:[id number] DOB: [birth year] DOA: 07/22/2021 PCP: [name], NP    Chief Complaint  Patient presents with   Chest Pain    Brief Narrative:  Patient 45 year old gentleman history of type 2 diabetes with retinopathy, neuropathy, microalbuminemia, hypertension, hyperlipidemia, chronic kidney disease stage III (baseline 1.1-1.2), COVID-19 infection July 2022 presented to the ED with complaints of chest pain radiating to bilateral shoulder blades x6 weeks.  Seen by PCP x-ray done showed abnormal curvature of the spine MRI ordered which was scheduled for October however due to worsening symptoms presented to the ED.  Patient noted to be afebrile.  Systolic blood pressures in the 160s.  Patient with a leukocytosis, hemoglobin at 7.5, D-dimer slightly elevated with VQ scan negative for PE.  CT chest without contrast with lytic bone lesions involving T5 and T6 and the sternum, concern for pathologic fracture of T6 with paraspinal hematoma or tumor could represent metastatic bone disease or possibly myeloma.  Patient pancultured with blood cultures 3/4 bottles positive for MSSA.  Case discussed with oncology who will formally consulted and recommended a multiple myeloma panel and urine light chains and LDH level which were ordered.  Patient placed on gentle hydration.  Patient placed on pain management.  Patient admitted for further evaluation and work-up.   Assessment & Plan:   Principal Problem:   Lytic bone lesions on xray Active Problems:   Type 2 diabetes mellitus with diabetic polyneuropathy, without long-term current use of insulin (HCC)   Hyperlipidemia   GAD (generalized anxiety disorder)   AKI (acute kidney injury) ([facility])   Anemia   MSSA bacteremia   Constipation   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)  1 lytic bone lesion/disease-highly suspicious for malignancy versus myeloma versus osteomyelitis -Patient presented with 6-week history of  chest pain.  Patient with recently diagnosed chronic kidney disease in March 2022 per patient. -CT chest with lytic bone disease involving T5 and T6 and possibly the sternum, pathologic fracture of T6 with paraspinal hematoma or tumor, findings suggestive of metastatic bone disease or possibly myeloma. -Radiology recommended MRI of the T and L-spine with and without contrast which was ordered after discussions with Dr. Burr Medico, oncology. -CT abdomen and pelvis also ordered to follow-up on any further lesions. -Multiple myeloma panel, urine light chains and LDH level ordered. -Oncology has assessed patient, following and patient possibly may need bone marrow biopsy which could possibly be done on Monday, July 27, 2021.   -IR consultation and evaluation for possible kyphoplasty. -Patient being followed by oncology and per their note states given limited bone involvement and T4/5 without other tissue bone marrow signal on the scan multiple myeloma less likely.  Oncology suspecting thoracic spine bone lesion could likely be to osteomyelitis. -Per oncology slight elevation in both kappa and lambda chains and not supporting diagnosis of multiple myeloma at this time. -SPEP UPEP ordered per hematology/oncology. -Continue empiric IV Ancef as recommended per ID. -Patient seen in consultation per palliative care and patient's long-acting OxyContin has been discontinued and patient placed on transdermal fentanyl patch due to patient's stage III chronic kidney disease and slowly rising serum creatinine.  IV Dilaudid changed to 1 mg every 3 hours as needed. -Palliative care to continue low-dose Xanax, Flexeril, Tylenol, Neurontin. -Oncology and palliative care following and appreciate input and recommendations.  2.  MSSA bacteremia -Patient with MSSA bacteremia noted on blood cultures.   -Patient with a left great toe wound with pus  noted and necrotic area likely source of bacteremia however podiatry doubts this  is the source of bacteremia..   -2D echo done negative for vegetations.   -Patient seen in consultation by ID who are recommending repeat blood cultures, need for TEE (cardiology notified for need for TEE initially felt will not be done on Wednesday however possibility of it being done tomorrow is high and as such patient will be made n.p.o. after midnight in anticipation of possible TEE tomorrow 07/27/2021).  -Continue antibiotics of IV Ancef.   -ID following and appreciate input and recommendations.   3.  AKI on chronic kidney disease stage IIIa -Baseline creatinine 1.3 from July 2022. -Creatinine elevated on admission at 1.99. -Renal function fluctuating creatinine currently at 2.02 today with no significant change over the past 24 hours.. -Continue to hold diuretics and ARB. -Saline lock IV fluids -Follow.  4.  Diabetes mellitus type 2 -Hemoglobin A1c of 7.29 May 2021. -Patient with long-term complication of retinopathy, neuropathy, microalbuminemia. -Continue to hold home oral hypoglycemic agents.   -SSI.  - -Neurontin.     5.  Hypertension Patient with significantly elevated blood pressure likely pain component on admission. -Continue to hold ARB and Lasix due to AKI. -Continue verapamil, and increase hydralazine to 50 mg every 8 hours. -IV hydralazine as needed systolic blood pressure > 180. -Pain management.  6.  Hyperlipidemia -Statin.  7.  Acute on chronic anemia/low vitamin B12 levels -Hemoglobin noted at baseline to be approximately 9 in July. -Patient presented he had a hemoglobin as low as 7.5 likely secondary to chronic kidney disease versus malignancy. -Patient with no overt bleeding. -Hemoglobin went as low as 7.1 on 07/25/2021 patient transfused 1 unit packed red blood cells.  Hemoglobin today at 8.1.  -May need IV iron during this hospitalization however will defer to oncology. -Vitamin B12 levels at 252 and as such patient placed on vitamin B12 supplementation  of subcutaneous during the hospitalization and transition to oral vitamin B12 on discharge.  - follow H&H.  8.  Constipation -Patient with hard stools last night which was small per patient.  -Continue lactulose twice daily, Senokot-S twice daily, Dulcolax suppositories daily.  -We will give an enema today.  9.  Left foot ulcer/wound, poa -Patient seen by wound care RN who assessed wound and noted pain content pus that pulled out from 2 different areas of the toe with a necrotic area noted. -Concern this might be source for patient's MSSA however podiatry doubts if this is the source and podiatry no further immediate work-up needed for the left hallux warranted at this time which ID is in agreement with. -Continue current wound care as recommended by podiatry..     DVT prophylaxis: Continue Heparin Code Status: Full Family Communication: Updated patient and wife at bedside. Disposition:   Status is: Inpatient  Remains inpatient appropriate because:Inpatient level of care appropriate due to severity of illness  Dispo: The patient is from: Home              Anticipated d/c is to: Home              Patient currently is not medically stable to d/c.   Difficult to place patient No       Consultants:  Oncology: Dr. Burr Medico 07/23/2021 ID: Dr. Gale Journey 07/24/2021 Podiatry : Dr. Sherryle Lis 07/24/2021 Palliative care: Dr. Rowe Pavy 07/25/2021  Procedures:  CT chest 07/22/2021 Chest x-ray 07/22/2021 VQ scan 07/22/2021 2D echo 07/24/2021 Transfusion 1 units packed red blood cells  07/25/2021   Antimicrobials: IV Ancef 07/23/2021>>>>>>   Subjective: Sitting up in bed.  Feels pain is a little bit better controlled today.  No significant shortness of breath.  Some intermittent hiccups.  No abdominal pain.   Objective: Vitals:   07/25/21 1434 07/25/21 2136 07/26/21 0518 07/26/21 0526  BP: (!) 164/90 (!) 149/87 (!) 162/96   Pulse: (!) 107 91 (!) 103   Resp: 18 18 17    Temp: 98.6 F (37 C) 98.2 F  (36.8 C) 98.6 F (37 C)   TempSrc: Oral Oral Oral   SpO2: 92% 97% 94%   Weight:    84.8 kg  Height:        Intake/Output Summary (Last 24 hours) at 07/26/2021 1130 Last data filed at 07/26/2021 0528 Gross per 24 hour  Intake 836 ml  Output --  Net 836 ml    Filed Weights   07/24/21 0500 07/25/21 0528 07/26/21 0526  Weight: 87.9 kg 87.6 kg 84.8 kg    Examination:  General exam: : NAD Respiratory system: CTA B.  No wheezes, no rhonchi.  Speaking in full sentences.  Normal respiratory effort. Cardiovascular system: Regular rate and rhythm no murmurs rubs or gallops.  No JVD.  No lower extremity edema.  Gastrointestinal system: Abdomen soft, nontender, nondistended, positive bowel sounds.  No rebound.  No guarding. Central nervous system: Alert and oriented. No focal neurological deficits. Extremities: Symmetric 5 x 5 power. Skin: Left great toe with dressing on.  Psychiatry: Judgement and insight appear normal. Mood & affect appropriate.  Data Reviewed: I have personally reviewed following labs and imaging studies  CBC: Recent Labs  Lab 07/22/21 1051 07/23/21 0508 07/24/21 0550 07/24/21 1423 07/25/21 0611 07/25/21 1824 07/26/21 0735  WBC 14.3* 14.4* 13.0*  --  12.9*  --  13.7*  NEUTROABS  --   --  9.2*  --  9.4*  --  10.4*  HGB 7.5* 8.6* 7.2* 7.7* 7.1* 8.6* 8.1*  HCT 23.0* 26.2* 22.1* 23.0* 21.5* 26.0* 24.1*  MCV 86.1 81.9 84.4  --  85.0  --  84.9  PLT 405* 371 332  --  321  --  310     Basic Metabolic Panel: Recent Labs  Lab 07/22/21 1051 07/23/21 0508 07/24/21 0550 07/25/21 0611 07/26/21 0735  NA 134* 135 131* 135 137  K 4.8 4.3 4.3 4.5 4.4  CL 98 103 101 103 103  CO2 27 25 24 22 24   GLUCOSE 184* 144* 133* 165* 222*  BUN 25* 22* 26* 26* 25*  CREATININE 1.99* 1.38* 2.15* 2.21* 2.02*  CALCIUM 9.4 9.2 8.4* 8.9 9.3  MG  --   --  1.8  --   --   PHOS  --   --   --   --  4.0     GFR: Estimated Creatinine Clearance: 48.1 mL/min (A) (by C-G formula  based on SCr of 2.02 mg/dL (H)).  Liver Function Tests: Recent Labs  Lab 07/26/21 0735  ALBUMIN 2.4*    CBG: Recent Labs  Lab 07/25/21 0740 07/25/21 1139 07/25/21 1720 07/25/21 2137 07/26/21 0740  GLUCAP 147* 168* 161* 156* 207*      Recent Results (from the past 240 hour(s))  Culture, blood (routine x 2)     Status: Abnormal   Collection Time: 07/22/21  2:11 PM   Specimen: BLOOD  Result Value Ref Range Status   Specimen Description   Final    BLOOD RIGHT ANTECUBITAL Performed at [facility], [address] [place], [place] [address]    Special Requests   Final    BOTTLES DRAWN AEROBIC AND ANAEROBIC Blood Culture results may not be optimal due to an excessive volume of blood received in culture bottles Performed at [facility] [address] [place], [place] [address]    Culture  Setup Time   Final    GRAM POSITIVE COCCI IN CLUSTERS IN BOTH AEROBIC AND ANAEROBIC BOTTLES CRITICAL RESULT CALLED TO, READ BACK BY AND VERIFIED WITH: PHARMD M.SWAYME AT 1751 ON 07/23/2021 BY T.SAAD. Performed at [facility], [place] [address] [place], [place] [address]    Culture STAPHYLOCOCCUS AUREUS (A)  Final   Report Status 07/25/2021 FINAL  Final   Organism ID, Bacteria STAPHYLOCOCCUS AUREUS  Final      Susceptibility   Staphylococcus aureus - MIC*    CIPROFLOXACIN <=0.5 SENSITIVE Sensitive     ERYTHROMYCIN RESISTANT Resistant     GENTAMICIN <=0.5 SENSITIVE Sensitive     OXACILLIN <=0.25 SENSITIVE Sensitive     TETRACYCLINE <=1 SENSITIVE Sensitive     VANCOMYCIN 1 SENSITIVE Sensitive     TRIMETH/SULFA <=10 SENSITIVE Sensitive     CLINDAMYCIN RESISTANT Resistant     RIFAMPIN <=0.5 SENSITIVE Sensitive     Inducible Clindamycin POSITIVE Resistant     * STAPHYLOCOCCUS AUREUS  Blood Culture ID Panel (Reflexed)     Status: Abnormal   Collection Time: 07/22/21  2:11 PM  Result Value Ref Range Status   Enterococcus faecalis NOT DETECTED  NOT DETECTED Final   Enterococcus Faecium NOT DETECTED NOT DETECTED Final   Listeria monocytogenes NOT DETECTED NOT DETECTED Final   Staphylococcus species DETECTED (A) NOT DETECTED Final    Comment: CRITICAL RESULT CALLED TO, READ BACK BY AND VERIFIED WITH: PHARMD M.SWAYME AT 1547 ON 07/23/2021 BY T.SAAD.    Staphylococcus aureus (BCID) DETECTED (A) NOT DETECTED Final    Comment: CRITICAL RESULT CALLED TO, READ BACK BY AND VERIFIED WITH: PHARMD M.SWAYME AT 1547 ON 07/23/2021 BY T.SAAD.    Staphylococcus epidermidis NOT DETECTED NOT DETECTED Final   Staphylococcus lugdunensis NOT DETECTED NOT DETECTED Final   Streptococcus species NOT DETECTED NOT DETECTED Final   Streptococcus agalactiae NOT DETECTED NOT DETECTED Final   Streptococcus pneumoniae NOT DETECTED NOT DETECTED Final   Streptococcus pyogenes NOT DETECTED NOT DETECTED Final   A.calcoaceticus-baumannii NOT DETECTED NOT DETECTED Final   Bacteroides fragilis NOT DETECTED NOT DETECTED Final   Enterobacterales NOT DETECTED NOT DETECTED Final   Enterobacter cloacae complex NOT DETECTED NOT DETECTED Final   Escherichia coli NOT DETECTED NOT DETECTED Final   Klebsiella aerogenes NOT DETECTED NOT DETECTED Final   Klebsiella oxytoca NOT DETECTED NOT DETECTED Final   Klebsiella pneumoniae NOT DETECTED NOT DETECTED Final   Proteus species NOT DETECTED NOT DETECTED Final   Salmonella species NOT DETECTED NOT DETECTED Final   Serratia marcescens NOT DETECTED NOT DETECTED Final   Haemophilus influenzae NOT DETECTED NOT DETECTED Final   Neisseria meningitidis NOT DETECTED NOT DETECTED Final   Pseudomonas aeruginosa NOT DETECTED NOT DETECTED Final   Stenotrophomonas maltophilia NOT DETECTED NOT DETECTED Final   Candida albicans NOT DETECTED NOT DETECTED Final   Candida auris NOT DETECTED NOT DETECTED Final   Candida glabrata NOT DETECTED NOT DETECTED Final   Candida krusei NOT DETECTED NOT DETECTED Final   Candida parapsilosis NOT  DETECTED NOT DETECTED Final   Candida tropicalis NOT DETECTED NOT DETECTED Final   Cryptococcus neoformans/gattii NOT DETECTED NOT DETECTED  Final   Meth resistant mecA/C and MREJ NOT DETECTED NOT DETECTED Final    Comment: Performed at [facility], [place] [address], [place], [place] [address]  Culture, blood (routine x 2)     Status: None (Preliminary result)   Collection Time: 07/22/21  3:13 PM   Specimen: BLOOD  Result Value Ref Range Status   Specimen Description   Final    BLOOD LEFT ANTECUBITAL Performed at [facility] [address], [place], [place] [address]    Special Requests   Final    BOTTLES DRAWN AEROBIC ONLY Blood Culture results may not be optimal due to an inadequate volume of blood received in culture bottles Performed at [facility] [address] [place], [place] [address]    Culture   Final    NO GROWTH 4 DAYS Performed at [facility], [place] [address] [place], [place] [address]    Report Status PENDING  Incomplete  SARS CORONAVIRUS 2 (TAT 6-24 HRS) Nasopharyngeal     Status: None   Collection Time: 07/22/21  6:07 PM   Specimen: Nasopharyngeal  Result Value Ref Range Status   SARS Coronavirus 2 NEGATIVE NEGATIVE Final    Comment: (NOTE) SARS-CoV-2 target nucleic acids are NOT DETECTED.  The SARS-CoV-2 RNA is generally detectable in upper and lower respiratory specimens during the acute phase of infection. Negative results do not preclude SARS-CoV-2 infection, do not rule out co-infections with other pathogens, and should not be used as the sole basis for treatment or other patient management decisions. Negative results must be combined with clinical observations, patient history, and epidemiological information. The expected result is Negative.  Fact Sheet for Patients: SugarRoll.be  Fact Sheet for Healthcare Providers: https://www.woods-mathews.com/  This  test is not yet approved or cleared by the [place] FDA and  has been authorized for detection and/or diagnosis of SARS-CoV-2 by FDA under an Emergency Use Authorization (EUA). This EUA will remain  in effect (meaning this test can be used) for the duration of the COVID-19 declaration under Se ction 564(b)(1) of the Act, 21 U.S.C. section 360bbb-3(b)(1), unless the authorization is terminated or revoked sooner.  Performed at [facility], [place] [address], [place], [place] [address]   Culture, blood (routine x 2)     Status: None (Preliminary result)   Collection Time: 07/24/21  5:04 PM   Specimen: BLOOD  Result Value Ref Range Status   Specimen Description   Final    BLOOD RIGHT ANTECUBITAL Performed at [facility] [address] [place], [place] [address]    Special Requests   Final    BOTTLES DRAWN AEROBIC AND ANAEROBIC Blood Culture adequate volume Performed at [facility] [address] [place], [place] [address]    Culture   Final    NO GROWTH 2 DAYS Performed at [facility] [address] [place], [place] [address]    Report Status PENDING  Incomplete  Culture, blood (routine x 2)     Status: None (Preliminary result)   Collection Time: 07/24/21  5:04 PM   Specimen: BLOOD  Result Value Ref Range Status   Specimen Description   Final    BLOOD LEFT ANTECUBITAL Performed at [facility] [address] [place], [place] [address]    Special Requests   Final    BOTTLES DRAWN AEROBIC AND ANAEROBIC Blood Culture adequate volume Performed at [facility],  [place] [address], [place], [place] [address]    Culture   Final    NO GROWTH 2 DAYS Performed at [facility] [address], [place], [place] [address]    Report Status PENDING  Incomplete          Radiology Studies: ECHOCARDIOGRAM COMPLETE  Result Date: 07/24/2021    ECHOCARDIOGRAM REPORT   Patient Name:    DIMETRIUS MONTFORT Date of Exam: 07/24/2021 Medical Rec #:  [id number]       Height:       71.0 in Accession #:    [id number]      Weight:       193.8 lb Date of Birth:  Oct 25, 1976       BSA:          2.081 m Patient Age:    6 years        BP:           139/81 mmHg Patient Gender: M               HR:           111 bpm. Exam Location:  Inpatient Procedure: 2D Echo, Color Doppler and Cardiac Doppler Indications:    Bacteremia  History:        Patient has no prior history of Echocardiogram examinations.                 Risk Factors:Diabetes and Dyslipidemia.  Sonographer:    Raquel Sarna Senior RDCS Referring Phys: [id number] Malachy Moan Jaiya Mooradian  Sonographer Comments: Chest wall very tender to the touch, exam performed with minimal probe pressure for patient comfort. Wall motion obtained from short axis. IMPRESSIONS  1. Left ventricular ejection fraction, by estimation, is >75%. The left ventricle has hyperdynamic function. The left ventricle has no regional wall motion abnormalities. Left ventricular diastolic parameters are indeterminate.  2. Right ventricular systolic function is normal. The right ventricular size is normal. There is normal pulmonary artery systolic pressure. The estimated right ventricular systolic pressure is 37.9 mmHg.  3. The mitral valve is grossly normal. No evidence of mitral valve regurgitation.  4. The aortic valve is tricuspid. Aortic valve regurgitation is not visualized.  5. The inferior vena cava is normal in size with <50% respiratory variability, suggesting right atrial pressure of 8 mmHg. Comparison(s): No prior Echocardiogram. Conclusion(s)/Recommendation(s): No evidence of valvular vegetations on this transthoracic echocardiogram, however the right-sided valves are poorly visualized. Would recommend a transesophageal echocardiogram to exclude infective endocarditis if clinically indicated. FINDINGS  Left Ventricle: Left ventricular ejection fraction, by estimation, is >75%. The left ventricle has  hyperdynamic function. The left ventricle has no regional wall motion abnormalities. The left ventricular internal cavity size was small. There is no left  ventricular hypertrophy. Left ventricular diastolic parameters are indeterminate. Right Ventricle: The right ventricular size is normal. No increase in right ventricular wall thickness. Right ventricular systolic function is normal. There is normal pulmonary artery systolic pressure. The tricuspid regurgitant velocity is 2.59 m/s, and  with an assumed right atrial pressure of 8 mmHg, the estimated right ventricular systolic pressure is 02.4 mmHg. Left Atrium: Left atrial size was normal in size. Right Atrium: Right atrial size was normal in size. Pericardium: There is no evidence of pericardial effusion. Mitral Valve: The mitral valve is grossly normal. No evidence of mitral valve regurgitation. Tricuspid Valve: The tricuspid valve is grossly normal. Tricuspid valve regurgitation is trivial. Aortic Valve: The aortic valve is tricuspid. Aortic valve  regurgitation is not visualized. Pulmonic Valve: The pulmonic valve was normal in structure. Pulmonic valve regurgitation is not visualized. Aorta: The aortic root and ascending aorta are structurally normal, with no evidence of dilitation. Venous: The inferior vena cava is normal in size with less than 50% respiratory variability, suggesting right atrial pressure of 8 mmHg. IAS/Shunts: No atrial level shunt detected by color flow Doppler.  LEFT VENTRICLE PLAX 2D LVIDd:         3.70 cm LVIDs:         2.15 cm LV PW:         1.15 cm LV IVS:        0.90 cm LVOT diam:     1.90 cm LV SV:         56 LV SV Index:   27 LVOT Area:     2.84 cm  RIGHT VENTRICLE RV S prime:     27.80 cm/s TAPSE (M-mode): 3.6 cm LEFT ATRIUM           Index LA diam:      3.25 cm 1.56 cm/m LA Vol (A4C): 28.3 ml 13.60 ml/m  AORTIC VALVE LVOT Vmax:   123.00 cm/s LVOT Vmean:  95.500 cm/s LVOT VTI:    0.197 m  AORTA Ao Root diam: 3.70 cm TRICUSPID  VALVE TR Peak grad:   26.8 mmHg TR Vmax:        259.00 cm/s  SHUNTS Systemic VTI:  0.20 m Systemic Diam: 1.90 cm Lyman Bishop MD Electronically signed by Lyman Bishop MD Signature Date/Time: 07/24/2021/1:53:34 PM    Final         Scheduled Meds:  amLODipine  5 mg Oral Daily   atorvastatin  40 mg Oral Daily   bisacodyl  10 mg Rectal Daily   cyanocobalamin  1,000 mcg Subcutaneous Daily   cyclobenzaprine  10 mg Oral BID   feeding supplement  237 mL Oral BID BM   fentaNYL  1 patch Transdermal Q72H   gabapentin  200 mg Oral TID   heparin  5,000 Units Subcutaneous Q8H   hydrALAZINE  50 mg Oral Q8H   insulin aspart  0-5 Units Subcutaneous QHS   insulin aspart  0-9 Units Subcutaneous TID WC   lactulose  20 g Oral BID   multivitamin with minerals  1 tablet Oral Daily   mupirocin ointment  1 application Topical BID   senna-docusate  1 tablet Oral BID   traZODone  100 mg Oral QHS   verapamil  120 mg Oral Daily   Continuous Infusions:   ceFAZolin (ANCEF) IV 2 g (07/26/21 0528)   chlorproMAZINE (THORAZINE) IV 25 mg (07/26/21 0854)     LOS: 4 days    Time spent: 40 minutes    Irine Seal, MD [facility]   To contact the attending provider between 7A-7P or the covering provider during after hours 7P-7A, please log into the web site [email or web address] and access using universal [place] password for that web site. If you do not have the password, please call the hospital operator.  07/26/2021, 11:30 AM

## 2021-07-27 ENCOUNTER — Inpatient Hospital Stay (HOSPITAL_COMMUNITY): Payer: 59

## 2021-07-27 ENCOUNTER — Encounter (HOSPITAL_COMMUNITY): Payer: Self-pay | Admitting: Internal Medicine

## 2021-07-27 DIAGNOSIS — Z7189 Other specified counseling: Secondary | ICD-10-CM

## 2021-07-27 DIAGNOSIS — M899 Disorder of bone, unspecified: Secondary | ICD-10-CM | POA: Diagnosis not present

## 2021-07-27 DIAGNOSIS — R7881 Bacteremia: Secondary | ICD-10-CM | POA: Diagnosis not present

## 2021-07-27 DIAGNOSIS — E11621 Type 2 diabetes mellitus with foot ulcer: Secondary | ICD-10-CM | POA: Diagnosis not present

## 2021-07-27 DIAGNOSIS — L97529 Non-pressure chronic ulcer of other part of left foot with unspecified severity: Secondary | ICD-10-CM | POA: Diagnosis not present

## 2021-07-27 DIAGNOSIS — D649 Anemia, unspecified: Secondary | ICD-10-CM | POA: Diagnosis not present

## 2021-07-27 DIAGNOSIS — R531 Weakness: Secondary | ICD-10-CM | POA: Diagnosis not present

## 2021-07-27 DIAGNOSIS — N179 Acute kidney failure, unspecified: Secondary | ICD-10-CM | POA: Diagnosis not present

## 2021-07-27 DIAGNOSIS — Z515 Encounter for palliative care: Secondary | ICD-10-CM | POA: Diagnosis not present

## 2021-07-27 DIAGNOSIS — K5900 Constipation, unspecified: Secondary | ICD-10-CM | POA: Diagnosis not present

## 2021-07-27 LAB — CULTURE, BLOOD (ROUTINE X 2): Culture: NO GROWTH

## 2021-07-27 LAB — TYPE AND SCREEN
ABO/RH(D): A POS
Antibody Screen: NEGATIVE
Unit division: 0
Unit division: 0

## 2021-07-27 LAB — BASIC METABOLIC PANEL
Anion gap: 13 (ref 5–15)
BUN: 27 mg/dL — ABNORMAL HIGH (ref 6–20)
CO2: 20 mmol/L — ABNORMAL LOW (ref 22–32)
Calcium: 9.1 mg/dL (ref 8.9–10.3)
Chloride: 104 mmol/L (ref 98–111)
Creatinine, Ser: 2.09 mg/dL — ABNORMAL HIGH (ref 0.61–1.24)
GFR, Estimated: 39 mL/min — ABNORMAL LOW (ref >60)
Glucose, Bld: 158 mg/dL — ABNORMAL HIGH (ref 70–99)
Potassium: 4.7 mmol/L (ref 3.5–5.1)
Sodium: 137 mmol/L (ref 135–145)

## 2021-07-27 LAB — CBC WITH DIFFERENTIAL/PLATELET
Abs Immature Granulocytes: 0.08 10*3/uL — ABNORMAL HIGH (ref 0.00–0.07)
Basophils Absolute: 0 10*3/uL (ref 0.0–0.1)
Basophils Relative: 0 %
Eosinophils Absolute: 0.4 10*3/uL (ref 0.0–0.5)
Eosinophils Relative: 3 %
HCT: 23.5 % — ABNORMAL LOW (ref 39.0–52.0)
Hemoglobin: 7.8 g/dL — ABNORMAL LOW (ref 13.0–17.0)
Immature Granulocytes: 1 %
Lymphocytes Relative: 9 %
Lymphs Abs: 1.2 10*3/uL (ref 0.7–4.0)
MCH: 28.2 pg (ref 26.0–34.0)
MCHC: 33.2 g/dL (ref 30.0–36.0)
MCV: 84.8 fL (ref 80.0–100.0)
Monocytes Absolute: 1.5 10*3/uL — ABNORMAL HIGH (ref 0.1–1.0)
Monocytes Relative: 12 %
Neutro Abs: 9.6 10*3/uL — ABNORMAL HIGH (ref 1.7–7.7)
Neutrophils Relative %: 75 %
Platelets: 291 10*3/uL (ref 150–400)
RBC: 2.77 MIL/uL — ABNORMAL LOW (ref 4.22–5.81)
RDW: 13.2 % (ref 11.5–15.5)
WBC: 12.8 10*3/uL — ABNORMAL HIGH (ref 4.0–10.5)
nRBC: 0 % (ref 0.0–0.2)

## 2021-07-27 LAB — BPAM RBC
Blood Product Expiration Date: 202210152359
Blood Product Expiration Date: 202210162359
ISSUE DATE / TIME: 202209282325
ISSUE DATE / TIME: 202210011200
Unit Type and Rh: 6200
Unit Type and Rh: 6200

## 2021-07-27 LAB — MULTIPLE MYELOMA PANEL, SERUM
Albumin SerPl Elph-Mcnc: 2.7 g/dL — ABNORMAL LOW (ref 2.9–4.4)
Albumin/Glob SerPl: 0.8 (ref 0.7–1.7)
Alpha 1: 0.4 g/dL (ref 0.0–0.4)
Alpha2 Glob SerPl Elph-Mcnc: 1.1 g/dL — ABNORMAL HIGH (ref 0.4–1.0)
B-Globulin SerPl Elph-Mcnc: 1.1 g/dL (ref 0.7–1.3)
Gamma Glob SerPl Elph-Mcnc: 1.2 g/dL (ref 0.4–1.8)
Globulin, Total: 3.7 g/dL (ref 2.2–3.9)
IgA: 270 mg/dL (ref 90–386)
IgG (Immunoglobin G), Serum: 1212 mg/dL (ref 603–1613)
IgM (Immunoglobulin M), Srm: 70 mg/dL (ref 20–172)
Total Protein ELP: 6.4 g/dL (ref 6.0–8.5)

## 2021-07-27 LAB — GLUCOSE, CAPILLARY
Glucose-Capillary: 200 mg/dL — ABNORMAL HIGH (ref 70–99)
Glucose-Capillary: 154 mg/dL — ABNORMAL HIGH (ref 70–99)
Glucose-Capillary: 137 mg/dL — ABNORMAL HIGH (ref 70–99)
Glucose-Capillary: 136 mg/dL — ABNORMAL HIGH (ref 70–99)
Glucose-Capillary: 157 mg/dL — ABNORMAL HIGH (ref 70–99)

## 2021-07-27 LAB — PROTIME-INR
INR: 1.2 (ref 0.8–1.2)
Prothrombin Time: 15.6 seconds — ABNORMAL HIGH (ref 11.4–15.2)

## 2021-07-27 IMAGING — DX DG CHEST 1V PORT
1 series · 1 of 1 positions shown · non-contrast
Comparison: [DATE]

CLINICAL DATA: Chest pain over the last 6 weeks.

EXAM:
PORTABLE CHEST 1 VIEW

[chest ap]
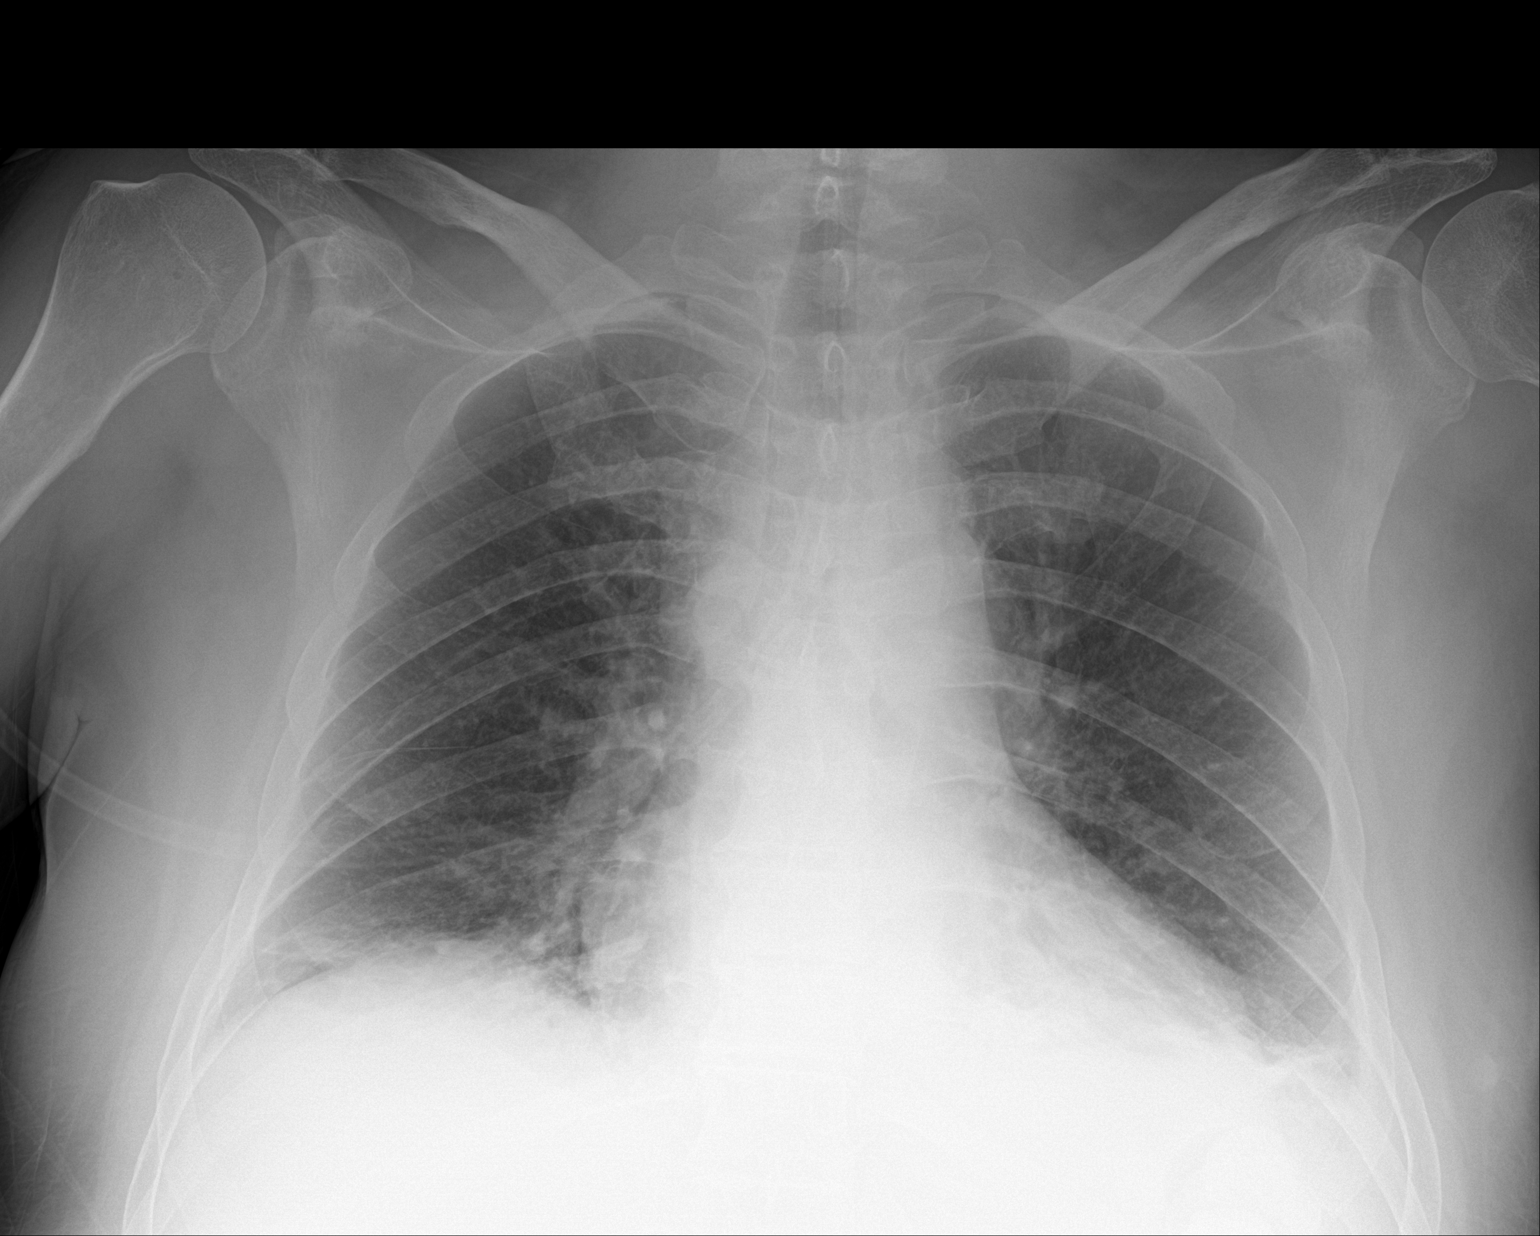

[1 of 1 positions shown; findings below may reference images not displayed]

FINDINGS: Heart size is normal. The patient has developed patchy infiltrates
at both lung bases consistent with bibasilar pneumonia. The upper
lungs are clear. No visible effusion. No abnormal bone finding.
IMPRESSION: Bilateral lower lobe pneumonia.

## 2021-07-27 IMAGING — CT CT HEAD W/O CM
4 series · 17 of 47 positions shown, 19 images · non-contrast
Comparison: None.

CLINICAL DATA: Dizziness

EXAM:
CT HEAD WITHOUT CONTRAST
TECHNIQUE: Contiguous axial images were obtained from the base of the skull
through the vertex without intravenous contrast.

[Series 2: head wo · axial · 0.47mm/px · z∈[+160,+280]mm · 7 of 34 slices shown, 9 images]
[im 5/34  brain]
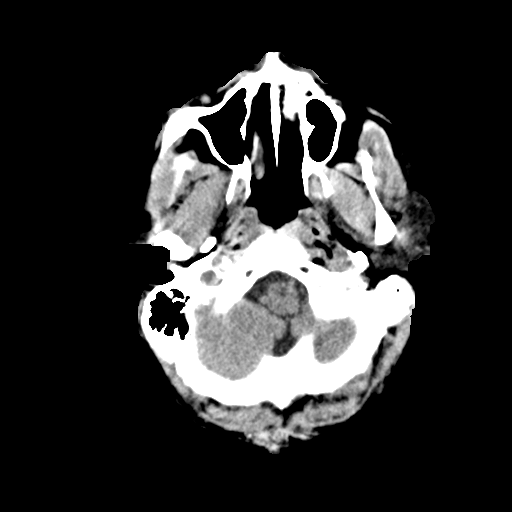
[im 5/34  bone]
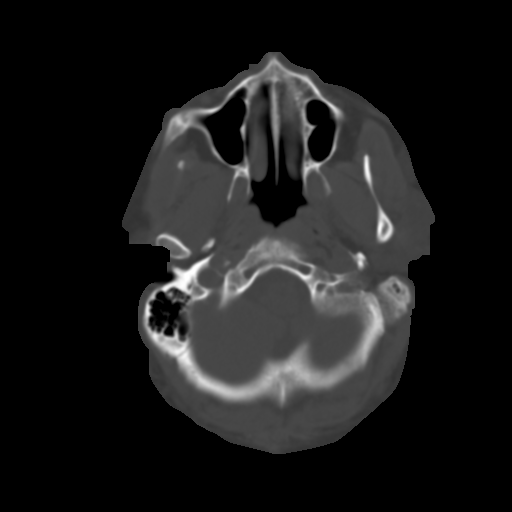
[im 9/34  brain]
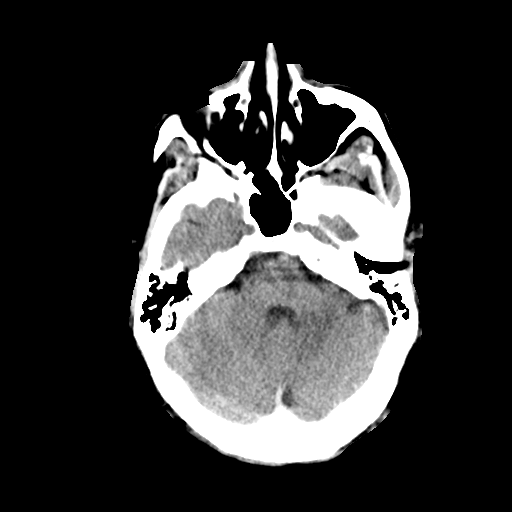
[im 13/34  brain]
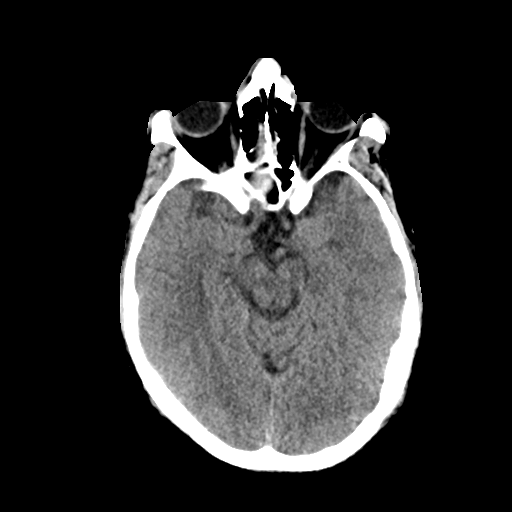
[im 17/34  brain]
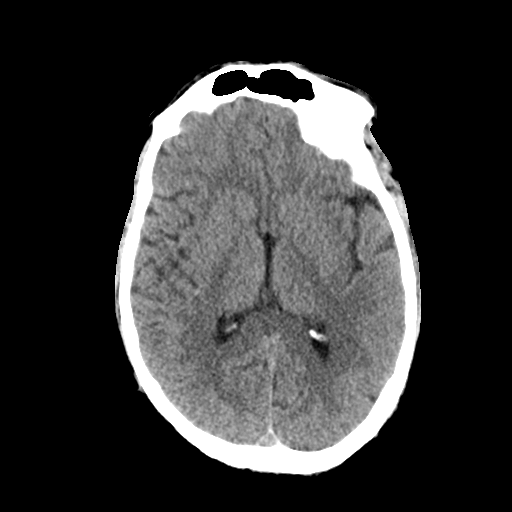
[im 21/34  brain]
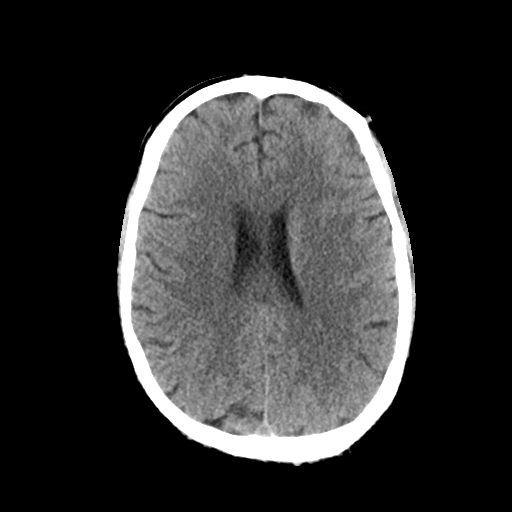
[im 21/34  bone]
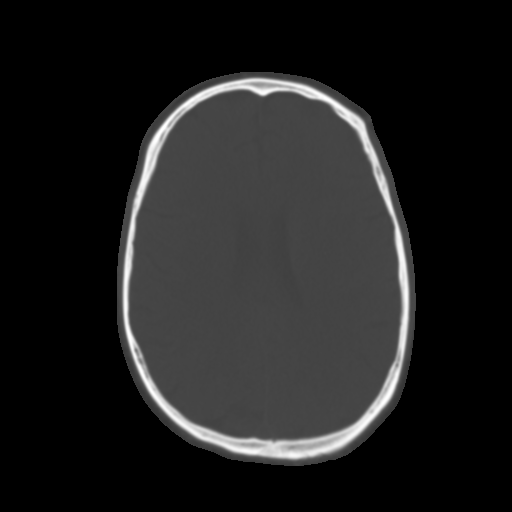
[im 25/34  brain]
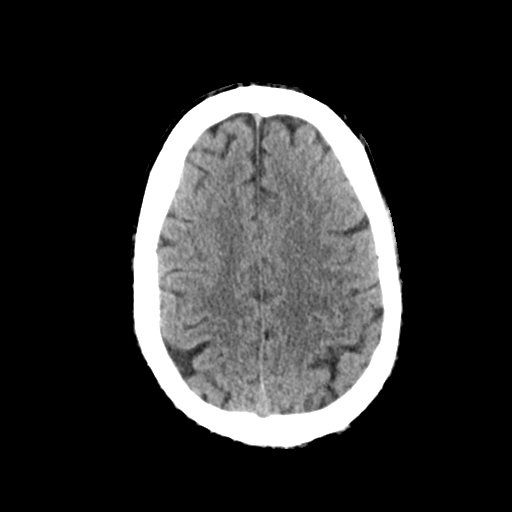
[im 29/34  brain]
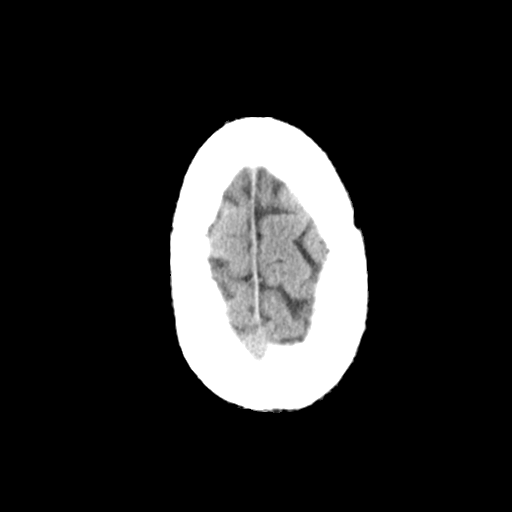

[Series 3: head bone · axial · 0.47mm/px · z∈[+156,+212]mm · 4 of 83 slices shown]
[im 9/83  bone]
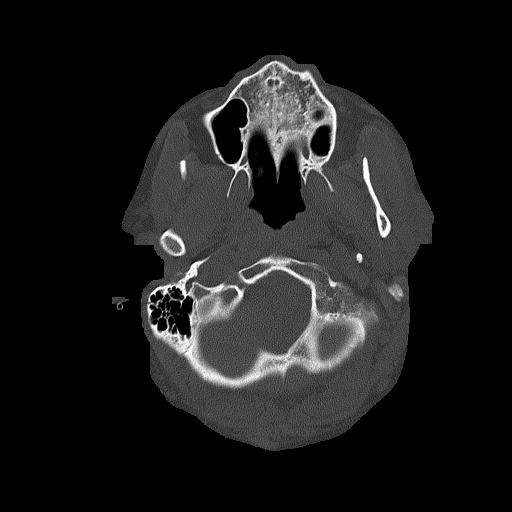
[im 17/83  bone]
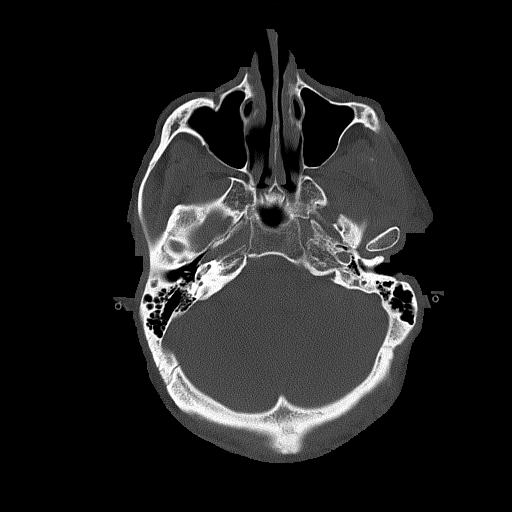
[im 25/83  bone]
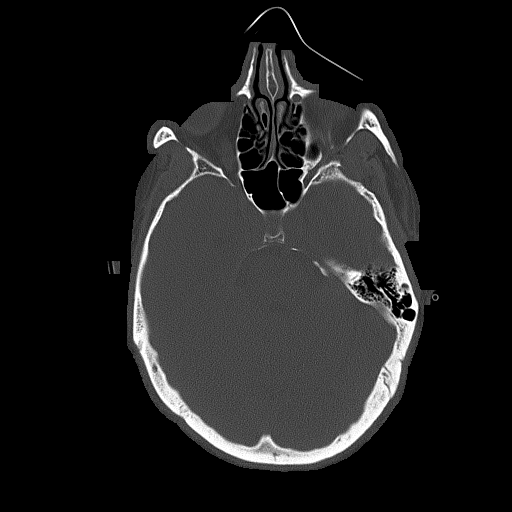
[im 37/83  bone]
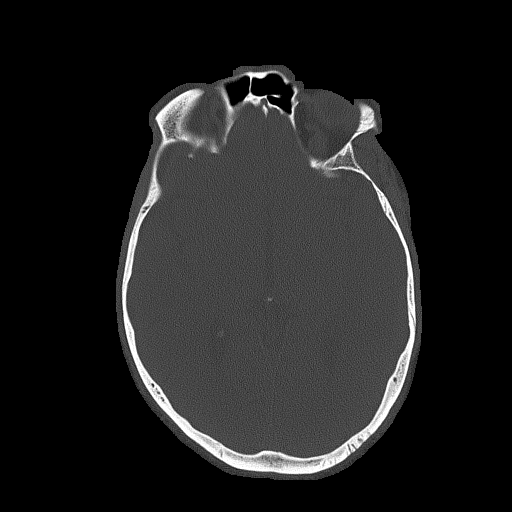

[Series 4: coronal soft tissue · coronal · 0.41mm/px · 3 of 72 slices shown]
[im 24/72  brain]
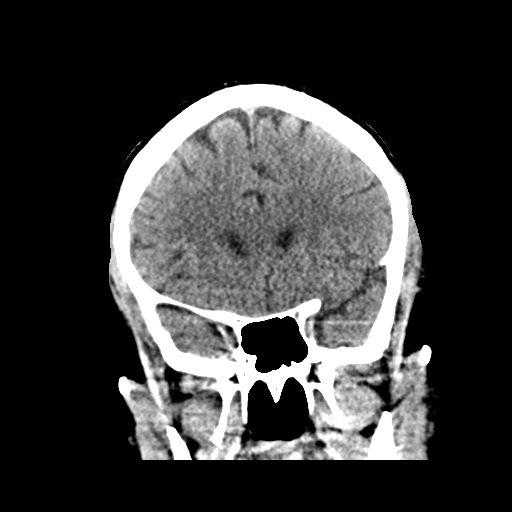
[im 32/72  brain]
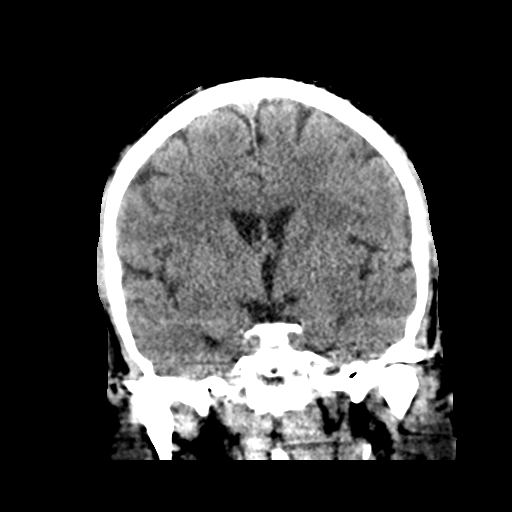
[im 40/72  brain]
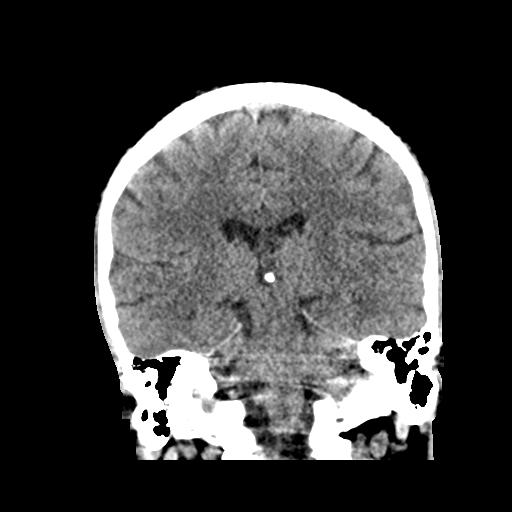

[Series 5: sagittal soft tissue · sagittal · 0.38mm/px · 3 of 52 slices shown]
[im 18/52  brain]
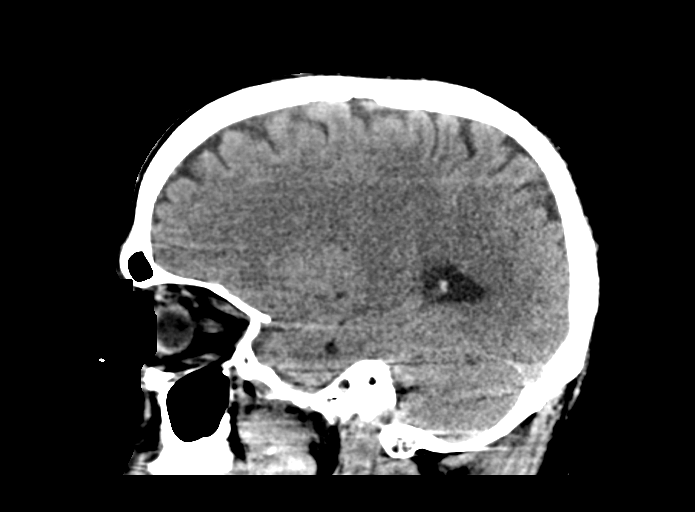
[im 26/52  brain]
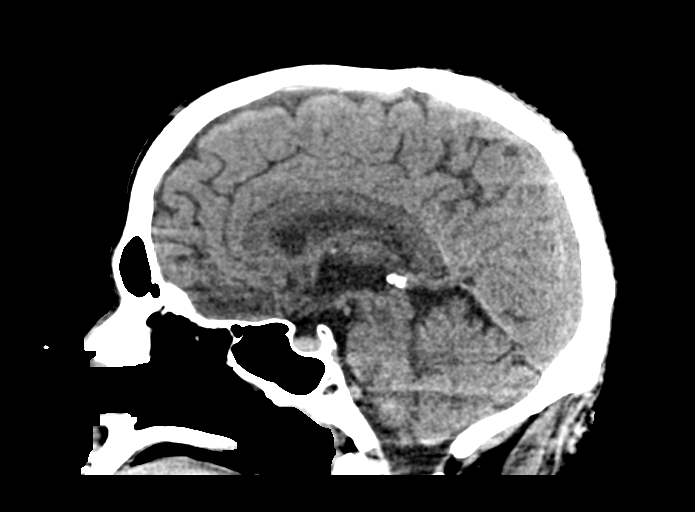
[im 35/52  brain]
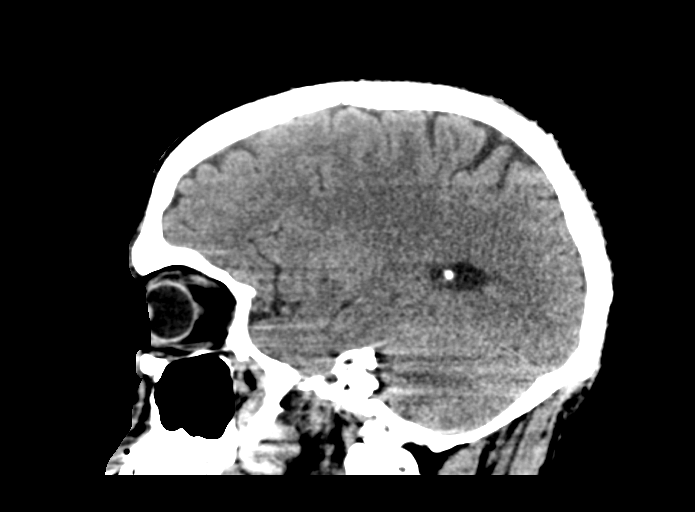

[17 of 47 positions shown; findings below may reference images not displayed]

FINDINGS: Brain: No evidence of acute infarction, hemorrhage, hydrocephalus,
extra-axial collection or mass lesion/mass effect.

Vascular: No hyperdense vessel or unexpected calcification.

Skull: Normal. Negative for fracture or focal lesion.

Sinuses/Orbits: No acute finding.

Other: None.
IMPRESSION: No acute intracranial abnormality noted.

## 2021-07-27 MED ORDER — FUROSEMIDE 10 MG/ML IJ SOLN
40.0000 mg | Freq: Once | INTRAMUSCULAR | Status: AC
  Administered 2021-07-27: 40 mg via INTRAVENOUS
  Filled 2021-07-27: qty 4

## 2021-07-27 MED ORDER — INSULIN ASPART 100 UNIT/ML IJ SOLN
0.0000 [IU] | Freq: Four times a day (QID) | INTRAMUSCULAR | Status: DC
  Administered 2021-07-27: 1 [IU] via SUBCUTANEOUS
  Administered 2021-07-27 – 2021-07-28 (×2): 2 [IU] via SUBCUTANEOUS
  Administered 2021-07-28: 1 [IU] via SUBCUTANEOUS
  Administered 2021-07-28: 2 [IU] via SUBCUTANEOUS
  Administered 2021-07-29: 1 [IU] via SUBCUTANEOUS
  Administered 2021-07-29: 2 [IU] via SUBCUTANEOUS
  Administered 2021-07-29: 1 [IU] via SUBCUTANEOUS
  Administered 2021-07-29: 2 [IU] via SUBCUTANEOUS
  Administered 2021-07-30: 1 [IU] via SUBCUTANEOUS
  Administered 2021-07-30: 3 [IU] via SUBCUTANEOUS
  Administered 2021-07-30: 1 [IU] via SUBCUTANEOUS
  Administered 2021-07-30 – 2021-07-31 (×2): 5 [IU] via SUBCUTANEOUS
  Administered 2021-07-31 (×2): 3 [IU] via SUBCUTANEOUS
  Administered 2021-07-31 – 2021-08-01 (×2): 5 [IU] via SUBCUTANEOUS
  Administered 2021-08-01: 2 [IU] via SUBCUTANEOUS
  Administered 2021-08-01: 5 [IU] via SUBCUTANEOUS
  Administered 2021-08-01: 3 [IU] via SUBCUTANEOUS
  Administered 2021-08-02: 5 [IU] via SUBCUTANEOUS
  Administered 2021-08-02: 3 [IU] via SUBCUTANEOUS
  Administered 2021-08-02: 2 [IU] via SUBCUTANEOUS
  Administered 2021-08-02: 5 [IU] via SUBCUTANEOUS
  Administered 2021-08-03: 2 [IU] via SUBCUTANEOUS

## 2021-07-27 MED ORDER — COLLAGENASE 250 UNIT/GM EX OINT
TOPICAL_OINTMENT | Freq: Every day | CUTANEOUS | Status: DC
  Administered 2021-07-29: 1 via TOPICAL
  Filled 2021-07-27: qty 30

## 2021-07-27 MED ORDER — HEPARIN SODIUM (PORCINE) 5000 UNIT/ML IJ SOLN
5000.0000 [IU] | Freq: Three times a day (TID) | INTRAMUSCULAR | Status: DC
  Administered 2021-07-28 – 2021-08-03 (×17): 5000 [IU] via SUBCUTANEOUS
  Filled 2021-07-27 (×17): qty 1

## 2021-07-27 NOTE — Progress Notes (Addendum)
Patient ID: Jose [name], male   DOB: 1975-12-15, 45 y.o.   MRN: [id number]    Referring Physician(s):Vu,T/Thompson,D   Supervising Physician: Pedro Earls  Patient Status:  [facility] - In-pt  Chief Complaint:  Upper back pain  Subjective: Patient known to IR service from recent work-up for possible bone marrow biopsy on 07/24/2021 which was subsequently canceled by oncology.He is a 45 y.o. male with PMH of DM2, retinopathy, neuropathy, microalbuminuria, HTN, HLD, CKD 3, COVID-19 infection July 2022.  He presented to ED 07/22/2021 complaining of chest pain that radiated to both shoulder blades for approximately 6 weeks.  Chest x-ray showed left lung base atelectasis.  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  MRI of the thoracic/lumbar spine on 9/29 revealed:  1. Abnormal bone marrow signal and contrast enhancement at the T4 and T5 levels with mild edema of the disc space and prevertebral phlegmon/hematoma. The findings could indicate osseous metastatic disease, multiple myeloma or discitis-osteomyelitis. 2. Minimally depressed compression fracture of T5 without retropulsion. 3. No epidural abscess. 4. No spinal canal or neural foraminal stenosis  Patient currently admitted with MSSA bacteremia.  Per oncology lab findings are not suspicious for multiple myeloma.  Patient has a left upper back lesion which appears to be staph associated carbuncle/ruptured/packed.  Also has a chronic left foot ulcer with mild erythema. TEE planned for 10/6.  Request now received from ID team for thoracic vertebral body biopsy(T5) for further evaluation.  Labs include WBC 12.8, hemoglobin 7.8, platelets normal, creatinine 2.09, PT/INR pending.  Patient currently denies fever, nausea, vomiting or bleeding.  Currently has a  headache, chest pain that goes from front to upper back/between shoulder blades, some dyspnea, constipation.  Past Medical History:  Diagnosis Date   GAD (generalized anxiety disorder)    Hyperlipidemia    Macular degeneration, bilateral    Retinopathy    Type II diabetes mellitus with complication, uncontrolled ([place])    retinopathy, neuropathy, microalbuminuria   Past Surgical History:  Procedure Laterality Date   APPENDECTOMY     HERNIA REPAIR     TRIGGER FINGER RELEASE Right 10/25/2019   Procedure: RIGHT INDEX FINGER RELEASE TRIGGER FINGER/A-1 PULLEY;  Surgeon: Daryll Brod, MD;  Location: [place];  Service: Orthopedics;  Laterality: Right;  IV REGIONAL FOREARM BLOCK      Allergies: Patient has no known allergies.  Medications: Prior to Admission medications   Medication Sig Start Date End Date Taking? Authorizing Provider  acetaminophen (TYLENOL) 500 MG tablet Take 1,000 mg by mouth every 6 (six) hours as needed for mild pain, fever or headache.   Yes [provider]  albuterol (VENTOLIN HFA) 108 (90 Base) MCG/ACT inhaler Inhale 1 puff into the lungs every 4 (four) hours as needed for wheezing or shortness of breath. 05/27/21  Yes [provider]  ALPRAZolam (XANAX) 0.25 MG tablet Take 0.25 mg by mouth 2 (two) times daily as needed for anxiety. 06/12/21  Yes [provider]  Ascorbic Acid (VITAMIN C PO) Take 1 tablet by mouth daily.   Yes [provider]  atorvastatin (LIPITOR) 40 MG tablet Take 1 tablet (40 mg total) by mouth daily. 03/12/19  Yes Jacelyn Pi, Lilia Argue, MD  cyclobenzaprine (FLEXERIL) 10 MG tablet Take 10 mg by mouth 2 (two) times daily. 07/09/21  Yes [provider]  Dulaglutide (TRULICITY) 3 CL/2.7NT SOPN Inject 3 mg as directed once a week. 06/11/21  Yes Renato Shin, MD  gabapentin (NEURONTIN) 300 MG capsule TAKE 1 TO 2 CAPSULES(300 TO 600 MG) BY MOUTH AT BEDTIME Patient taking differently: Take 600 mg  by mouth at bedtime. 12/01/19  Yes Jacelyn Pi, Lilia Argue, MD  glimepiride (AMARYL) 1 MG tablet Take 1 tablet (1 mg total) by mouth daily with breakfast. 01/08/21  Yes Renato Shin, MD  HYDROcodone-acetaminophen (NORCO/VICODIN) 5-325 MG tablet Take 1 tablet by mouth 2 (two) times daily as needed for severe pain. 07/07/21  Yes [provider]  losartan (COZAAR) 50 MG tablet TAKE 1 TABLET(50 MG) BY MOUTH DAILY Patient taking differently: Take 50 mg by mouth 2 (two) times daily. 07/21/20  Yes Jacelyn Pi, Lilia Argue, MD  metFORMIN (GLUCOPHAGE-XR) 500 MG 24 hr tablet Take 1 tablet (500 mg total) by mouth daily. 06/11/21  Yes Renato Shin, MD  Multiple Vitamin (MULTIVITAMIN ADULT PO) Take 1 tablet by mouth daily.   Yes [provider]  oxyCODONE (OXY IR/ROXICODONE) 5 MG immediate release tablet Take 5 mg by mouth 2 (two) times daily as needed for pain or severe pain. 07/21/21  Yes [provider]  Ranibizumab (LUCENTIS) 0.3 MG/0.05ML SOLN 1 Dose by Intravitreal route every 3 (three) months.   Yes [provider]  tadalafil (CIALIS) 20 MG tablet TK 1 T PO  PO QD PRN FOR ERECTILE DYSFUNCTION Patient taking differently: Take 20 mg by mouth daily as needed for erectile dysfunction. 07/20/19  Yes Jacelyn Pi, Irma M, MD  tobramycin (TOBREX) 0.3 % ophthalmic solution Place 1 drop into both eyes See admin instructions. Begin 1 day prior to treatment and continue the day of treatment and for one full day after treatment. 07/05/18  Yes [provider]  traZODone (DESYREL) 100 MG tablet TAKE 1 TABLET(100 MG) BY MOUTH AT BEDTIME AS NEEDED FOR SLEEP Patient taking differently: Take 100 mg by mouth at bedtime. 03/05/21  Yes Ravi, Himabindu, MD  VITAMIN D PO Take 1 tablet by mouth daily.   Yes [provider]  amLODipine (NORVASC) 5 MG tablet Take 5 mg by mouth daily. 03/18/21   [provider]  amphetamine-dextroamphetamine (ADDERALL) 20 MG tablet Take 1 tablet (20  mg total) by mouth 2 (two) times daily. 03/05/21 07/04/21  Elvin So, MD  DULoxetine (CYMBALTA) 60 MG capsule TAKE 1 CAPSULE(60 MG) BY MOUTH DAILY Patient not taking: Reported on 07/22/2021 03/05/21   Elvin So, MD  furosemide (LASIX) 20 MG tablet Take 20 mg by mouth daily. 04/30/21   [provider]  verapamil (CALAN-SR) 120 MG CR tablet Take 120 mg by mouth daily. 04/17/21   [provider]     Vital Signs: BP (!) 145/82 (BP Location: Right Arm)   Pulse (!) 106   Temp 97.7 F (36.5 C) (Oral)   Resp 18   Ht 5' 11"  (1.803 m)   Wt 189 lb (85.7 kg)   SpO2 94%   BMI 26.36 kg/m   Physical Exam patient awake, answers questions okay.  A little drowsy.  Complaining of headache. Chest- dim BS bases; heart- sl tachy but reg rhythm, ? soft murmur; abd soft, few BS,NT; no LE edema; small carbuncle upper chest that has ruptured /packing in place.  Also with previous pilonidal cyst sacral region.  Left toe/hallux ulcer  Imaging: CT ABDOMEN PELVIS WO CONTRAST  Result Date: 07/23/2021 CLINICAL DATA:  Cancer of unknown primary, lytic thoracic spine lesions  EXAM: CT ABDOMEN AND PELVIS WITHOUT CONTRAST TECHNIQUE: Multidetector CT imaging of the abdomen and pelvis was performed following the standard protocol without IV contrast. Unenhanced CT was performed per clinician order. Lack of IV contrast limits sensitivity and specificity, especially for evaluation of abdominal/pelvic solid viscera. COMPARISON:  07/22/2021 FINDINGS: Lower chest: Hypoventilatory changes at the dependent lower lobes. No acute pleural or parenchymal lung disease. Hepatobiliary: No gross liver abnormalities on this unenhanced exam. Gallbladder is moderately distended without cholelithiasis or cholecystitis. Pancreas: Unremarkable. No pancreatic ductal dilatation or surrounding inflammatory changes. Spleen: Normal in size without focal abnormality. Adrenals/Urinary Tract: No urinary tract calculi or obstructive  uropathy. Excreted contrast within the bladder consistent with preceding MRI. The adrenals are unremarkable. Stomach/Bowel: No bowel obstruction or ileus. Moderate retained stool throughout the colon. The appendix is surgically absent. No bowel wall thickening or inflammatory change. Vascular/Lymphatic: Aortic atherosclerosis. No enlarged abdominal or pelvic lymph nodes. Reproductive: Prostate is unremarkable. Other: No free fluid or free gas. Small fat containing bilateral inguinal hernias. No bowel herniation. Musculoskeletal: There are no acute or destructive bony lesions. Reconstructed images demonstrate no additional findings. IMPRESSION: 1. No acute intra-abdominal or intrapelvic process. 2. Moderate fecal retention consistent with constipation. 3.  Aortic Atherosclerosis (ICD10-I70.0). Electronically Signed   By: Randa Ngo M.D.   On: 07/23/2021 22:26   MR THORACIC SPINE W WO CONTRAST  Result Date: 07/23/2021 CLINICAL DATA:  Bone lesion, T-spine, malignancy suspected; Bone lesion, L/S-spine, malignancy suspected EXAM: MRI THORACIC AND LUMBAR SPINE WITHOUT AND WITH CONTRAST TECHNIQUE: Multiplanar and multiecho pulse sequences of the thoracic and lumbar spine were obtained without and with intravenous contrast. CONTRAST:  65m GADAVIST GADOBUTROL 1 MMOL/ML IV SOLN COMPARISON:  None. FINDINGS: MRI THORACIC SPINE FINDINGS Alignment:  Physiologic. Vertebrae: There is abnormal bone marrow signal and contrast enhancement at T4 and T5. There is mild edema of the disc space. There is a minimally depressed fracture of the superior T5 endplate. Cord:  Normal signal and morphology. Paraspinal and other soft tissues: There is phlegmon like contrast enhancement of the prevertebral soft tissues at the T4 and T5 levels. No epidural abscess. Disc levels: There is no spinal canal or neural foraminal stenosis. MRI LUMBAR SPINE FINDINGS Segmentation:  Standard. Alignment:  Physiologic. Vertebrae:  No fracture, evidence  of discitis, or bone lesion. Conus medullaris: Extends to the L1 level and appears normal. Paraspinal and other soft tissues: Negative. Disc levels: No spinal canal or neural foraminal stenosis. IMPRESSION: 1. Abnormal bone marrow signal and contrast enhancement at the T4 and T5 levels with mild edema of the disc space and prevertebral phlegmon/hematoma. The findings could indicate osseous metastatic disease, multiple myeloma or discitis-osteomyelitis. 2. Minimally depressed compression fracture of T5 without retropulsion. 3. No epidural abscess. 4. No spinal canal or neural foraminal stenosis. Electronically Signed   By: KUlyses JarredM.D.   On: 07/23/2021 19:55   MR Lumbar Spine W Wo Contrast  Result Date: 07/23/2021 CLINICAL DATA:  Bone lesion, T-spine, malignancy suspected; Bone lesion, L/S-spine, malignancy suspected EXAM: MRI THORACIC AND LUMBAR SPINE WITHOUT AND WITH CONTRAST TECHNIQUE: Multiplanar and multiecho pulse sequences of the thoracic and lumbar spine were obtained without and with intravenous contrast. CONTRAST:  15mGADAVIST GADOBUTROL 1 MMOL/ML IV SOLN COMPARISON:  None. FINDINGS: MRI THORACIC SPINE FINDINGS Alignment:  Physiologic. Vertebrae: There is abnormal bone marrow signal and contrast enhancement at T4 and T5. There is mild edema of the disc space. There is a minimally depressed fracture of the superior T5 endplate.  Cord:  Normal signal and morphology. Paraspinal and other soft tissues: There is phlegmon like contrast enhancement of the prevertebral soft tissues at the T4 and T5 levels. No epidural abscess. Disc levels: There is no spinal canal or neural foraminal stenosis. MRI LUMBAR SPINE FINDINGS Segmentation:  Standard. Alignment:  Physiologic. Vertebrae:  No fracture, evidence of discitis, or bone lesion. Conus medullaris: Extends to the L1 level and appears normal. Paraspinal and other soft tissues: Negative. Disc levels: No spinal canal or neural foraminal stenosis. IMPRESSION:  1. Abnormal bone marrow signal and contrast enhancement at the T4 and T5 levels with mild edema of the disc space and prevertebral phlegmon/hematoma. The findings could indicate osseous metastatic disease, multiple myeloma or discitis-osteomyelitis. 2. Minimally depressed compression fracture of T5 without retropulsion. 3. No epidural abscess. 4. No spinal canal or neural foraminal stenosis. Electronically Signed   By: Ulyses Jarred M.D.   On: 07/23/2021 19:55   DG CHEST PORT 1 VIEW  Result Date: 07/27/2021 CLINICAL DATA:  Chest pain over the last 6 weeks. EXAM: PORTABLE CHEST 1 VIEW COMPARISON:  07/22/2021 FINDINGS: Heart size is normal. The patient has developed patchy infiltrates at both lung bases consistent with bibasilar pneumonia. The upper lungs are clear. No visible effusion. No abnormal bone finding. IMPRESSION: Bilateral lower lobe pneumonia. Electronically Signed   By: Nelson Chimes M.D.   On: 07/27/2021 12:27   ECHOCARDIOGRAM COMPLETE  Result Date: 07/24/2021    ECHOCARDIOGRAM REPORT   Patient Name:   RAIDER VALBUENA Date of Exam: 07/24/2021 Medical Rec #:  [id number]       Height:       71.0 in Accession #:    [id number]      Weight:       193.8 lb Date of Birth:  [birth year]       BSA:          2.081 m Patient Age:    41 years        BP:           139/81 mmHg Patient Gender: M               HR:           111 bpm. Exam Location:  Inpatient Procedure: 2D Echo, Color Doppler and Cardiac Doppler Indications:    Bacteremia  History:        Patient has no prior history of Echocardiogram examinations.                 Risk Factors:Diabetes and Dyslipidemia.  Sonographer:    Raquel Sarna Senior RDCS Referring Phys: [id number] Malachy Moan THOMPSON  Sonographer Comments: Chest wall very tender to the touch, exam performed with minimal probe pressure for patient comfort. Wall motion obtained from short axis. IMPRESSIONS  1. Left ventricular ejection fraction, by estimation, is >75%. The left ventricle has hyperdynamic  function. The left ventricle has no regional wall motion abnormalities. Left ventricular diastolic parameters are indeterminate.  2. Right ventricular systolic function is normal. The right ventricular size is normal. There is normal pulmonary artery systolic pressure. The estimated right ventricular systolic pressure is 62.1 mmHg.  3. The mitral valve is grossly normal. No evidence of mitral valve regurgitation.  4. The aortic valve is tricuspid. Aortic valve regurgitation is not visualized.  5. The inferior vena cava is normal in size with <50% respiratory variability, suggesting right atrial pressure of 8 mmHg. Comparison(s): No prior Echocardiogram. Conclusion(s)/Recommendation(s): No evidence of valvular vegetations on this  transthoracic echocardiogram, however the right-sided valves are poorly visualized. Would recommend a transesophageal echocardiogram to exclude infective endocarditis if clinically indicated. FINDINGS  Left Ventricle: Left ventricular ejection fraction, by estimation, is >75%. The left ventricle has hyperdynamic function. The left ventricle has no regional wall motion abnormalities. The left ventricular internal cavity size was small. There is no left  ventricular hypertrophy. Left ventricular diastolic parameters are indeterminate. Right Ventricle: The right ventricular size is normal. No increase in right ventricular wall thickness. Right ventricular systolic function is normal. There is normal pulmonary artery systolic pressure. The tricuspid regurgitant velocity is 2.59 m/s, and  with an assumed right atrial pressure of 8 mmHg, the estimated right ventricular systolic pressure is 94.5 mmHg. Left Atrium: Left atrial size was normal in size. Right Atrium: Right atrial size was normal in size. Pericardium: There is no evidence of pericardial effusion. Mitral Valve: The mitral valve is grossly normal. No evidence of mitral valve regurgitation. Tricuspid Valve: The tricuspid valve is grossly  normal. Tricuspid valve regurgitation is trivial. Aortic Valve: The aortic valve is tricuspid. Aortic valve regurgitation is not visualized. Pulmonic Valve: The pulmonic valve was normal in structure. Pulmonic valve regurgitation is not visualized. Aorta: The aortic root and ascending aorta are structurally normal, with no evidence of dilitation. Venous: The inferior vena cava is normal in size with less than 50% respiratory variability, suggesting right atrial pressure of 8 mmHg. IAS/Shunts: No atrial level shunt detected by color flow Doppler.  LEFT VENTRICLE PLAX 2D LVIDd:         3.70 cm LVIDs:         2.15 cm LV PW:         1.15 cm LV IVS:        0.90 cm LVOT diam:     1.90 cm LV SV:         56 LV SV Index:   27 LVOT Area:     2.84 cm  RIGHT VENTRICLE RV S prime:     27.80 cm/s TAPSE (M-mode): 3.6 cm LEFT ATRIUM           Index LA diam:      3.25 cm 1.56 cm/m LA Vol (A4C): 28.3 ml 13.60 ml/m  AORTIC VALVE LVOT Vmax:   123.00 cm/s LVOT Vmean:  95.500 cm/s LVOT VTI:    0.197 m  AORTA Ao Root diam: 3.70 cm TRICUSPID VALVE TR Peak grad:   26.8 mmHg TR Vmax:        259.00 cm/s  SHUNTS Systemic VTI:  0.20 m Systemic Diam: 1.90 cm Lyman Bishop MD Electronically signed by Lyman Bishop MD Signature Date/Time: 07/24/2021/1:53:34 PM    Final     Labs:  CBC: Recent Labs    07/24/21 0550 07/24/21 1423 07/25/21 8592 07/25/21 1824 07/26/21 0735 07/27/21 0502  WBC 13.0*  --  12.9*  --  13.7* 12.8*  HGB 7.2*   < > 7.1* 8.6* 8.1* 7.8*  HCT 22.1*   < > 21.5* 26.0* 24.1* 23.5*  PLT 332  --  321  --  310 291   < > = values in this interval not displayed.    COAGS: No results for input(s): INR, APTT in the last 8760 hours.  BMP: Recent Labs    07/24/21 0550 07/25/21 0611 07/26/21 0735 07/27/21 0502  NA 131* 135 137 137  K 4.3 4.5 4.4 4.7  CL 101 103 103 104  CO2 24 22 24  20*  GLUCOSE 133* 165* 222* 158*  BUN 26* 26* 25* 27*  CALCIUM 8.4* 8.9 9.3 9.1  CREATININE 2.15* 2.21* 2.02* 2.09*   GFRNONAA 37* 36* 40* 39*    LIVER FUNCTION TESTS: Recent Labs    05/18/21 1703 05/20/21 0359 07/26/21 0735  BILITOT 0.7 0.7  --   AST 24 17  --   ALT 26 19  --   ALKPHOS 90 69  --   PROT 8.1 6.6  --   ALBUMIN 4.4 3.4* 2.4*    Assessment and Plan: Patient known to IR service from recent work-up for possible bone marrow biopsy on 07/24/2021 which was subsequently canceled by oncology.He is a 45 y.o. male with PMH of DM2, retinopathy, neuropathy, microalbuminuria, HTN, HLD, CKD 3, COVID-19 infection July 2022.  He presented to ED 07/22/2021 complaining of chest pain that radiated to both shoulder blades for approximately 6 weeks.  Chest x-ray showed left lung base atelectasis.  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  MRI of the thoracic/lumbar spine on 9/29 revealed:  1. Abnormal bone marrow signal and contrast enhancement at the T4 and T5 levels with mild edema of the disc space and prevertebral phlegmon/hematoma. The findings could indicate osseous metastatic disease, multiple myeloma or discitis-osteomyelitis. 2. Minimally depressed compression fracture of T5 without retropulsion. 3. No epidural abscess. 4. No spinal canal or neural foraminal stenosis  Patient currently admitted with MSSA bacteremia.  Per oncology lab findings are not suspicious for multiple myeloma.  Patient has a left upper back lesion which appears to be staph associated carbuncle/ruptured/packed.  Also has a chronic left foot ulcer with mild erythema. TEE planned for 10/6.  Request now received from ID team for thoracic vertebral body biopsy (T5) for further evaluation.  Labs include WBC 12.8, hemoglobin 7.8, platelets normal, creatinine 2.09, PT/INR pending.  Imaging studies have been reviewed by Dr. Debbrah Alar. Risks and benefits of procedure  was discussed with the patient including, but not limited to bleeding, infection, damage to adjacent structures or low yield requiring additional tests.  All of the questions were answered and there is agreement to proceed.  Consent signed and in chart.  Procedure to be done at [facility] on 10/4 AM.  [facility] transport to be arranged by nursing.  Patient to return to [facility] after procedure.  Kyphoplasty/RFA will not be done until patient has fully recovered from bacteremia with negative follow-up cultures.    Electronically Signed: D. Rowe Robert, PA-C 07/27/2021, 3:32 PM   I spent a total of 25 minutes at the the patient's bedside AND on the patient's hospital floor or unit, greater than 50% of which was counseling/coordinating care for image guided thoracic vertebral body biopsy(T5)

## 2021-07-27 NOTE — Progress Notes (Signed)
PROGRESS NOTE    Jose [name]  UPJ:[id number] DOB: 10-05-1976 DOA: 07/22/2021 PCP: [name], NP    Chief Complaint  Patient presents with   Chest Pain    Brief Narrative:  Patient 45 year old gentleman history of type 2 diabetes with retinopathy, neuropathy, microalbuminemia, hypertension, hyperlipidemia, chronic kidney disease stage III (baseline 1.1-1.2), COVID-19 infection July 2022 presented to the ED with complaints of chest pain radiating to bilateral shoulder blades x6 weeks.  Seen by PCP x-ray done showed abnormal curvature of the spine MRI ordered which was scheduled for October however due to worsening symptoms presented to the ED.  Patient noted to be afebrile.  Systolic blood pressures in the 160s.  Patient with a leukocytosis, hemoglobin at 7.5, D-dimer slightly elevated with VQ scan negative for PE.  CT chest without contrast with lytic bone lesions involving T5 and T6 and the sternum, concern for pathologic fracture of T6 with paraspinal hematoma or tumor could represent metastatic bone disease or possibly myeloma.  Patient pancultured with blood cultures 3/4 bottles positive for MSSA.  Case discussed with oncology who will formally consulted and recommended a multiple myeloma panel and urine light chains and LDH level which were ordered.  Patient placed on gentle hydration.  Patient placed on pain management.  Patient admitted for further evaluation and work-up.   Assessment & Plan:   Principal Problem:   Lytic bone lesions on xray Active Problems:   Type 2 diabetes mellitus with diabetic polyneuropathy, without long-term current use of insulin (HCC)   Hyperlipidemia   GAD (generalized anxiety disorder)   AKI (acute kidney injury) ([place])   Anemia   MSSA bacteremia   Constipation   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)  1 lytic bone lesion/disease-highly suspicious for malignancy versus myeloma versus osteomyelitis -Patient presented with 6-week history of  chest pain.  Patient with recently diagnosed chronic kidney disease in March 2022 per patient. -CT chest with lytic bone disease involving T5 and T6 and possibly the sternum, pathologic fracture of T6 with paraspinal hematoma or tumor, findings suggestive of metastatic bone disease or possibly myeloma. -Radiology recommended MRI of the T and L-spine with and without contrast which was ordered after discussions with Dr. Burr Medico, oncology. -CT abdomen and pelvis also ordered to follow-up on any further lesions. -Multiple myeloma panel, urine light chains and LDH level ordered. -Oncology has assessed patient, following and patient possibly may need bone marrow biopsy which could possibly be done on Monday, July 27, 2021.   -IR consultation and evaluation for possible kyphoplasty which at this time likely on hold until bacteremia is treated.. -Patient being followed by oncology and per their note states given limited bone involvement and T4/5 without other tissue bone marrow signal on the scan multiple myeloma less likely.  Oncology suspecting thoracic spine bone lesion could likely be to osteomyelitis. -Per oncology slight elevation in both kappa and lambda chains and not supporting diagnosis of multiple myeloma at this time. -SPEP UPEP ordered per hematology/oncology. -Continue empiric IV Ancef as recommended per ID. -Patient seen in consultation per palliative care and patient's long-acting OxyContin has been discontinued and patient placed on transdermal fentanyl patch due to patient's stage III chronic kidney disease and slowly rising serum creatinine.  IV Dilaudid changed to 1 mg every 3 hours as needed. -Palliative care to continue low-dose Xanax, Flexeril, Tylenol, Neurontin. -IR consult placed to ID for vertebral bone biopsy/pathology for further evaluation of lytic bone lesions. -Oncology and palliative care following and appreciate  input and recommendations.  2.  MSSA bacteremia -Patient with  MSSA bacteremia noted on blood cultures.   -Patient with a left great toe wound with pus noted and necrotic area likely source of bacteremia however podiatry doubts this is the source of bacteremia..   -2D echo done negative for vegetations.   -Patient seen in consultation by ID who are recommending repeat blood cultures, need for TEE (cardiology notified for need for TEE initially felt will not be done on Wednesday however possibility of it being done tomorrow is high and as such patient will be made n.p.o. after midnight in anticipation of possible TEE which was to be done today however unable to be done per cardiology and likely will be done on 07/30/2021 and as such patient placed back on a diet.  -Continue antibiotics of IV Ancef.   -ID following and appreciate input and recommendations.   3.  AKI on chronic kidney disease stage IIIa -Baseline creatinine 1.3 from July 2022. -Creatinine elevated on admission at 1.99. -Renal function fluctuating creatinine currently at 2.09.   -Diuretics and ARB on hold.   -IV fluids saline lock.   -Due to concern for volume overload we will give Lasix 40 mg IV x1 and monitor urine output.   -Repeat labs in the morning.    4.  Diabetes mellitus type 2 -Hemoglobin A1c of 7.29 May 2021. -Patient with long-term complication of retinopathy, neuropathy, microalbuminemia. -Continue to hold home oral hypoglycemic agents.   -SSI.  - -Neurontin.     5.  Hypertension Patient with significantly elevated blood pressure likely pain component on admission. -Continue to hold ARB and Lasix due to AKI. -Continue verapamil, and increase hydralazine to 50 mg every 8 hours. -IV hydralazine as needed systolic blood pressure > 180. -We will give a dose of IV Lasix due to concerns for volume overload. -Pain management.  6.  Hyperlipidemia -Statin.  7.  Acute on chronic anemia/low vitamin B12 levels -Hemoglobin noted at baseline to be approximately 9 in  July. -Patient presented he had a hemoglobin as low as 7.5 likely secondary to chronic kidney disease versus malignancy. -Patient with no overt bleeding. -Hemoglobin went as low as 7.1 on 07/25/2021 patient transfused 1 unit packed red blood cells.  Hemoglobin today at 7.8. -May need IV iron during this hospitalization however will defer to oncology. -Vitamin B12 levels at 252 and as such patient placed on vitamin B12 supplementation of subcutaneous during the hospitalization and transition to oral vitamin B12 on discharge.  - follow H&H.  8.  Constipation -Patient with hard stools noted to be small over the past 2 days.   -Continue lactulose twice daily, Senokot-S twice daily, Dulcolax suppositories daily.   -Enema ordered yesterday.   -Continue current bowel regimen.    9.  Left foot ulcer/wound, poa -Patient seen by wound care RN who assessed wound and noted pain content pus that pulled out from 2 different areas of the toe with a necrotic area noted. -Concern this might be source for patient's MSSA however podiatry doubts if this is the source and podiatry no further immediate work-up needed for the left hallux warranted at this time which ID is in agreement with. -Continue current wound care as recommended by podiatry.  10 probable volume overload -Patient noted to have some complaints of dizziness, some shortness of breath. -Bibasilar crackles noted on examination. -Check a chest x-ray. -Lasix 40 mg IV x1. -Strict I's and O's.  Daily weights.    DVT prophylaxis:  Continue Heparin Code Status: Full Family Communication: Updated patient and wife at bedside. Disposition:   Status is: Inpatient  Remains inpatient appropriate because:Inpatient level of care appropriate due to severity of illness  Dispo: The patient is from: Home              Anticipated d/c is to: Home              Patient currently is not medically stable to d/c.   Difficult to place patient No        Consultants:  Oncology: Dr. Burr Medico 07/23/2021 ID: Dr. Gale Journey 07/24/2021 Podiatry : Dr. Sherryle Lis 07/24/2021 Palliative care: Dr. Rowe Pavy 07/25/2021  Procedures:  CT chest 07/22/2021 Chest x-ray 07/22/2021 VQ scan 07/22/2021 2D echo 07/24/2021 Transfusion 1 units packed red blood cells 07/25/2021   Antimicrobials: IV Ancef 07/23/2021>>>>>>   Subjective: Patient laying in bed on his phone.  Feels pain is better controlled on current regimen per palliative care.  Some improvement with shortness of breath.  No abdominal pain.  Still having small hard stools.  Patient noted to be n.p.o. early on today in anticipation of possible TEE however TEE cannot be done until Thursday and patient placed back on a diet. Was called by RN later on in the morning that patient with some complaints of dizziness went and assessed the patient.  Objective: Vitals:   07/26/21 2045 07/27/21 0615 07/27/21 0619 07/27/21 0622  BP: 118/73 (!) 178/99 (!) 178/99   Pulse: 94  (!) 110   Resp: 14  17   Temp: 99 F (37.2 C)  98.2 F (36.8 C)   TempSrc: Oral  Oral   SpO2: 90%  92%   Weight:    85.7 kg  Height:        Intake/Output Summary (Last 24 hours) at 07/27/2021 0932 Last data filed at 07/27/2021 0853 Gross per 24 hour  Intake 565 ml  Output 400 ml  Net 165 ml    Filed Weights   07/25/21 0528 07/26/21 0526 07/27/21 0622  Weight: 87.6 kg 84.8 kg 85.7 kg    Examination:  General exam: : NAD Respiratory system: Some bibasilar crackles.  No wheezing.  Fair air movement.  Speaking in full sentences.  Cardiovascular system: Tachycardia.  No murmurs rubs or gallops.  No JVD.  No lower extremity edema .  Gastrointestinal system: Abdomen is soft, nontender, nondistended, positive bowel sounds.  No rebound.  No guarding. Central nervous system: Alert and oriented. No focal neurological deficits. Extremities: Symmetric 5 x 5 power. Skin: No rashes, lesions or ulcers Psychiatry: Judgement and insight appear normal. Mood  & affect appropriate.  Data Reviewed: I have personally reviewed following labs and imaging studies  CBC: Recent Labs  Lab 07/23/21 0508 07/24/21 0550 07/24/21 1423 07/25/21 0611 07/25/21 1824 07/26/21 0735 07/27/21 0502  WBC 14.4* 13.0*  --  12.9*  --  13.7* 12.8*  NEUTROABS  --  9.2*  --  9.4*  --  10.4* 9.6*  HGB 8.6* 7.2* 7.7* 7.1* 8.6* 8.1* 7.8*  HCT 26.2* 22.1* 23.0* 21.5* 26.0* 24.1* 23.5*  MCV 81.9 84.4  --  85.0  --  84.9 84.8  PLT 371 332  --  321  --  310 291     Basic Metabolic Panel: Recent Labs  Lab 07/23/21 0508 07/24/21 0550 07/25/21 0611 07/26/21 0735 07/27/21 0502  NA 135 131* 135 137 137  K 4.3 4.3 4.5 4.4 4.7  CL 103 101 103 103 104  CO2 25 24  22 24 20*  GLUCOSE 144* 133* 165* 222* 158*  BUN 22* 26* 26* 25* 27*  CREATININE 1.38* 2.15* 2.21* 2.02* 2.09*  CALCIUM 9.2 8.4* 8.9 9.3 9.1  MG  --  1.8  --   --   --   PHOS  --   --   --  4.0  --      GFR: Estimated Creatinine Clearance: 46.5 mL/min (A) (by C-G formula based on SCr of 2.09 mg/dL (H)).  Liver Function Tests: Recent Labs  Lab 07/26/21 0735  ALBUMIN 2.4*     CBG: Recent Labs  Lab 07/25/21 1139 07/25/21 1720 07/25/21 2137 07/26/21 0740 07/26/21 1145  GLUCAP 168* 161* 156* 207* 169*      Recent Results (from the past 240 hour(s))  Culture, blood (routine x 2)     Status: Abnormal   Collection Time: 07/22/21  2:11 PM   Specimen: BLOOD  Result Value Ref Range Status   Specimen Description   Final    BLOOD RIGHT ANTECUBITAL Performed at [facility], [place] [address], [place], [place] [address]    Special Requests   Final    BOTTLES DRAWN AEROBIC AND ANAEROBIC Blood Culture results may not be optimal due to an excessive volume of blood received in culture bottles Performed at [facility] [address], [place], [place] [address]    Culture  Setup Time   Final    GRAM POSITIVE COCCI IN CLUSTERS IN BOTH AEROBIC AND ANAEROBIC  BOTTLES CRITICAL RESULT CALLED TO, READ BACK BY AND VERIFIED WITH: PHARMD M.SWAYME AT 8295 ON 07/23/2021 BY T.SAAD. Performed at [facility], [place] [address], [place], [place] [address]    Culture STAPHYLOCOCCUS AUREUS (A)  Final   Report Status 07/25/2021 FINAL  Final   Organism ID, Bacteria STAPHYLOCOCCUS AUREUS  Final      Susceptibility   Staphylococcus aureus - MIC*    CIPROFLOXACIN <=0.5 SENSITIVE Sensitive     ERYTHROMYCIN RESISTANT Resistant     GENTAMICIN <=0.5 SENSITIVE Sensitive     OXACILLIN <=0.25 SENSITIVE Sensitive     TETRACYCLINE <=1 SENSITIVE Sensitive     VANCOMYCIN 1 SENSITIVE Sensitive     TRIMETH/SULFA <=10 SENSITIVE Sensitive     CLINDAMYCIN RESISTANT Resistant     RIFAMPIN <=0.5 SENSITIVE Sensitive     Inducible Clindamycin POSITIVE Resistant     * STAPHYLOCOCCUS AUREUS  Blood Culture ID Panel (Reflexed)     Status: Abnormal   Collection Time: 07/22/21  2:11 PM  Result Value Ref Range Status   Enterococcus faecalis NOT DETECTED NOT DETECTED Final   Enterococcus Faecium NOT DETECTED NOT DETECTED Final   Listeria monocytogenes NOT DETECTED NOT DETECTED Final   Staphylococcus species DETECTED (A) NOT DETECTED Final    Comment: CRITICAL RESULT CALLED TO, READ BACK BY AND VERIFIED WITH: PHARMD M.SWAYME AT 1547 ON 07/23/2021 BY T.SAAD.    Staphylococcus aureus (BCID) DETECTED (A) NOT DETECTED Final    Comment: CRITICAL RESULT CALLED TO, READ BACK BY AND VERIFIED WITH: PHARMD M.SWAYME AT 1547 ON 07/23/2021 BY T.SAAD.    Staphylococcus epidermidis NOT DETECTED NOT DETECTED Final   Staphylococcus lugdunensis NOT DETECTED NOT DETECTED Final   Streptococcus species NOT DETECTED NOT DETECTED Final   Streptococcus agalactiae NOT DETECTED NOT DETECTED Final   Streptococcus pneumoniae NOT DETECTED NOT DETECTED Final   Streptococcus pyogenes NOT DETECTED NOT DETECTED Final   A.calcoaceticus-baumannii NOT DETECTED NOT DETECTED Final   Bacteroides fragilis NOT  DETECTED NOT DETECTED Final   Enterobacterales NOT DETECTED NOT DETECTED Final   Enterobacter cloacae complex NOT DETECTED NOT DETECTED Final   Escherichia coli NOT DETECTED NOT DETECTED Final   Klebsiella aerogenes NOT DETECTED NOT DETECTED Final   Klebsiella oxytoca NOT DETECTED NOT DETECTED Final   Klebsiella pneumoniae NOT DETECTED NOT DETECTED Final   Proteus species NOT DETECTED NOT DETECTED Final   Salmonella species NOT DETECTED NOT DETECTED Final   Serratia marcescens NOT DETECTED NOT DETECTED Final   Haemophilus influenzae NOT DETECTED NOT DETECTED Final   Neisseria meningitidis NOT DETECTED NOT DETECTED Final   Pseudomonas aeruginosa NOT DETECTED NOT DETECTED Final   Stenotrophomonas maltophilia NOT DETECTED NOT DETECTED Final   Candida albicans NOT DETECTED NOT DETECTED Final   Candida auris NOT DETECTED NOT DETECTED Final   Candida glabrata NOT DETECTED NOT DETECTED Final   Candida krusei NOT DETECTED NOT DETECTED Final   Candida parapsilosis NOT DETECTED NOT DETECTED Final   Candida tropicalis NOT DETECTED NOT DETECTED Final   Cryptococcus neoformans/gattii NOT DETECTED NOT DETECTED Final   Meth resistant mecA/C and MREJ NOT DETECTED NOT DETECTED Final    Comment: Performed at [facility], [address], [place], [place] [address]  Culture, blood (routine x 2)     Status: None   Collection Time: 07/22/21  3:13 PM   Specimen: BLOOD  Result Value Ref Range Status   Specimen Description   Final    BLOOD LEFT ANTECUBITAL Performed at [facility] [address], [place], [place] [address]    Special Requests   Final    BOTTLES DRAWN AEROBIC ONLY Blood Culture results may not be optimal due to an inadequate volume of blood received in culture bottles Performed at [facility] [address] [place], [place] [address]    Culture   Final    NO GROWTH 5 DAYS Performed at [facility], [place] [address], [place],  [place] [address]    Report Status 07/27/2021 FINAL  Final  SARS CORONAVIRUS 2 (TAT 6-24 HRS) Nasopharyngeal     Status: None   Collection Time: 07/22/21  6:07 PM   Specimen: Nasopharyngeal  Result Value Ref Range Status   SARS Coronavirus 2 NEGATIVE NEGATIVE Final    Comment: (NOTE) SARS-CoV-2 target nucleic acids are NOT DETECTED.  The SARS-CoV-2 RNA is generally detectable in upper and lower respiratory specimens during the acute phase of infection. Negative results do not preclude SARS-CoV-2 infection, do not rule out co-infections with other pathogens, and should not be used as the sole basis for treatment or other patient management decisions. Negative results must be combined with clinical observations, patient history, and epidemiological information. The expected result is Negative.  Fact Sheet for Patients: SugarRoll.be  Fact Sheet for Healthcare Providers: https://www.woods-mathews.com/  This test is not yet approved or cleared by the [place] FDA and  has been authorized for detection and/or diagnosis of SARS-CoV-2 by FDA under an Emergency Use Authorization (EUA). This EUA will remain  in effect (meaning this test can be used) for the duration of the COVID-19 declaration under Se ction 564(b)(1) of the Act, 21 U.S.C. section 360bbb-3(b)(1), unless the authorization is terminated or revoked sooner.  Performed at [facility], [place] [address], [place], [place] [address]   Culture, blood (routine x 2)     Status: None (Preliminary result)   Collection Time: 07/24/21  5:04 PM   Specimen: BLOOD  Result  Value Ref Range Status   Specimen Description   Final    BLOOD RIGHT ANTECUBITAL Performed at [place] [address], [place], [place] [address]    Special Requests   Final    BOTTLES DRAWN AEROBIC AND ANAEROBIC Blood Culture adequate volume Performed at [facility]  [address] [place], [place] [address]    Culture   Final    NO GROWTH 3 DAYS Performed at [facility], [place] [address], [place], [place] [address]    Report Status PENDING  Incomplete  Culture, blood (routine x 2)     Status: None (Preliminary result)   Collection Time: 07/24/21  5:04 PM   Specimen: BLOOD  Result Value Ref Range Status   Specimen Description   Final    BLOOD LEFT ANTECUBITAL Performed at [facility] [address], [place], [place] [address]    Special Requests   Final    BOTTLES DRAWN AEROBIC AND ANAEROBIC Blood Culture adequate volume Performed at [facility] [address] [place], [place] [address]    Culture   Final    NO GROWTH 3 DAYS Performed at [facility], [place] [address], [place], [place] [address]    Report Status PENDING  Incomplete          Radiology Studies: No results found.      Scheduled Meds:  amLODipine  5 mg Oral Daily   atorvastatin  40 mg Oral Daily   bisacodyl  10 mg Rectal Daily   cyanocobalamin  1,000 mcg Subcutaneous Daily   cyclobenzaprine  10 mg Oral BID   feeding supplement  237 mL Oral BID BM   fentaNYL  1 patch Transdermal Q72H   gabapentin  200 mg Oral TID   heparin  5,000 Units Subcutaneous Q8H   hydrALAZINE  50 mg Oral Q8H   insulin aspart  0-9 Units Subcutaneous Q6H   lactulose  20 g Oral BID   multivitamin with minerals  1 tablet Oral Daily   mupirocin ointment  1 application Topical BID   senna-docusate  1 tablet Oral BID   traZODone  100 mg Oral QHS   verapamil  120 mg Oral Daily   Continuous Infusions:   ceFAZolin (ANCEF) IV 2 g (07/27/21 0606)   chlorproMAZINE (THORAZINE) IV 25 mg (07/26/21 0854)     LOS: 5 days    Time spent: 40 minutes    Irine Seal, MD [facility]   To contact the attending provider between 7A-7P or the covering provider during after hours 7P-7A, please log into the web site [email or web address] and access  using universal [place] password for that web site. If you do not have the password, please call the hospital operator.  07/27/2021, 9:32 AM

## 2021-07-27 NOTE — Progress Notes (Signed)
PT demonstrated hands on understanding of Flutter device. 

## 2021-07-27 NOTE — Progress Notes (Signed)
Daily Progress Note   Patient Name: Jose [name]       Date: 07/27/2021 DOB: [birth year]  Age: 45 y.o. MRN#: [id number] Attending Physician: Eugenie Filler, MD Primary Care Physician: [name], NP Admit Date: 07/22/2021  Reason for Consultation/Follow-up: Non pain symptom management and Pain control  Subjective:  Patient is awake alert resting in bed, wife at bedside, patient has ongoing pain in mid upper back, wife showed picture of purulent draining lesion on his mid upper back, RN put dressing on the area.  Patient NPO for TEE, however, this will likely be done another day due to scheduling and diet to be resumed.  Pain medication history noted.  Patient's wife is asking for further information from all team members regarding next steps, work up and treatment options for the patient.  I attempted to answer her questions to the best of my ability, offered active listening and supportive presence, acknowledged the serious nature of this patient's hospitalization. Offered her support, various different disciplines are involved, she asks that I touch base with them today, on  her behalf. I have discussed with TRH MD and will try to get in touch with ID and med onc colleagues. She states that she appreciates palliative care being involved,but doesn't want the focus to only be for him to get pain medication and for it to "mask" the real issue. Her real focus is on diagnosis and management of the patient's condition, I stated that I acknowledged her concerns and offered support to her.    Length of Stay: 5  Current Medications: Scheduled Meds:  . amLODipine  5 mg Oral Daily  . atorvastatin  40 mg Oral Daily  . cyanocobalamin  1,000 mcg Subcutaneous Daily  . cyclobenzaprine  10 mg Oral  BID  . feeding supplement  237 mL Oral BID BM  . fentaNYL  1 patch Transdermal Q72H  . gabapentin  200 mg Oral TID  . heparin  5,000 Units Subcutaneous Q8H  . hydrALAZINE  50 mg Oral Q8H  . insulin aspart  0-9 Units Subcutaneous Q6H  . lactulose  20 g Oral BID  . multivitamin with minerals  1 tablet Oral Daily  . mupirocin ointment  1 application Topical BID  . senna-docusate  1 tablet Oral BID  . traZODone  100 mg Oral  QHS  . verapamil  120 mg Oral Daily    Continuous Infusions: .  ceFAZolin (ANCEF) IV 2 g (07/27/21 0606)  . chlorproMAZINE (THORAZINE) IV 25 mg (07/26/21 0854)    PRN Meds: acetaminophen, albuterol, ALPRAZolam, chlorproMAZINE (THORAZINE) IV, hydrALAZINE, HYDROmorphone (DILAUDID) injection, oxyCODONE  Physical Exam         Resting in bed Regular work of breathing No distress Awake alert In pain.   Abdomen not distended No edema Has draining lesion on back, which is currently dressed,  Has pain and swelling, some warmth to R posterior flank area.   Vital Signs: BP (!) 178/99 (BP Location: Right Arm) Comment: Patient is getting oral hydralazine now. If not lowered in one hour day RN can give IV hydralazine.  Pulse (!) 110   Temp 98.2 F (36.8 C) (Oral)   Resp 17   Ht 5' 11"  (1.803 m)   Wt 85.7 kg   SpO2 92%   BMI 26.36 kg/m  SpO2: SpO2: 92 % O2 Device: O2 Device: Nasal Cannula O2 Flow Rate: O2 Flow Rate (L/min): 2 L/min  Intake/output summary:  Intake/Output Summary (Last 24 hours) at 07/27/2021 1102 Last data filed at 07/27/2021 0853 Gross per 24 hour  Intake 565 ml  Output 400 ml  Net 165 ml    LBM: Last BM Date: 07/27/21 Baseline Weight: Weight: 87.9 kg Most recent weight: Weight: 85.7 kg       Palliative Assessment/Data:      Patient Active Problem List   Diagnosis Date Noted  . Diabetic ulcer of left great toe ([place])   . MSSA bacteremia 07/23/2021  . Constipation 07/23/2021  . Lytic bone lesions on xray 07/22/2021  . AKI (acute  kidney injury) ([place]) 05/19/2021  . Anemia 05/19/2021  . Nausea and vomiting 05/19/2021  . Elevated blood pressure reading 05/19/2021  . COVID-19 05/18/2021  . Bilateral carpal tunnel syndrome 01/23/2020  . Numbness 12/12/2019  . Microalbuminuria due to type 2 diabetes mellitus ([place]) 07/20/2019  . GAD (generalized anxiety disorder) 06/18/2019  . Adult ADHD 06/18/2019  . Trigger middle finger of left hand 06/11/2019  . Diabetic retinopathy ([place]) 07/13/2018  . Type 2 diabetes mellitus with diabetic polyneuropathy, without long-term current use of insulin ([place]) 05/17/2018  . Tobacco use disorder 05/17/2018  . Hyperlipidemia 05/17/2018    Palliative Care Assessment & Plan   Patient Profile:    Assessment:  45 y.o. male  with past medical history of type 2 diabetes with retinopathy, neuropathy, microalbuminemia, history of hypertension dyslipidemia stage III chronic kidney disease, COVID-19 infection in July 2022 admitted on 07/22/2021 with chest pain radiating to bilateral shoulder blades for 6 weeks.    Patient presented to the emergency department and was admitted to hospital medicine service.  Patient was found to have CT chest with lytic bone lesions involving T5 and T6 as well as the sternum.  There was concern for pathologic fracture of T5.  Multiple myeloma is in the differential diagnosis.  Found to have MSSA bacteremia. Patient is being followed by medical oncology and infectious disease.  He was also seen by podiatry because of left hallux ulcer.  Patient was seen by interventional radiology and is to undergo bone marrow biopsy on 07-27-2021.  He continues on antibiotics. Palliative consultation for pain management has been requested.  Recommendations/Plan: Continue fentanyl patch and IV Dilaudid PRN. Monitor pain control.  Monitor overall condition, hospital course and disease trajectory of illness.  Appreciate multi disciplinary team approach and support.  Goals of Care and  Additional Recommendations: Limitations on Scope of Treatment: Full Scope Treatment  Code Status:    Code Status Orders  (From admission, onward)           Start     Ordered   07/22/21 1845  Full code  Continuous        07/22/21 1844           Code Status History     Date Active Date Inactive Code Status Order ID Comments User Context   05/19/2021 0418 05/21/2021 1847 Full Code [id number]  Tacey Ruiz, MD Inpatient       Prognosis:  Unable to determine  Discharge Planning: To Be Determined  Care plan was discussed with patient and wife. Also discussing with other team members. Seen with RN in the room.   Thank you for allowing the Palliative Medicine Team to assist in the care of this patient.   Time In: 10 Time Out: 10.35 Total Time 35 Prolonged Time Billed  no       Greater than 50%  of this time was spent counseling and coordinating care related to the above assessment and plan.  Loistine Chance, MD  Please contact Palliative Medicine Team phone at [phone] for questions and concerns.

## 2021-07-27 NOTE — Consult Note (Signed)
[facility] Nurse Consult Note: Reason for Consult: back wound and ? Area of previous pilonidal cyst  Wound type: MARSI; medical adhesive related skin injury: upper back; thoracic area; reported skin injury from pain management patch that was removed; thick yellow slough hanging from wound bed with reepithelializing noted under this.  Partial thickness skin loss; deep into gluteal cleft; fissure appearance. No opening to suggest actively draining pilonidal cyst.  He has not had any surgical intervention for these in the past, reports only spontaneous drainage.   Pressure Injury POA: NA Measurement: Back wound: 0.5cm x 0.5cm x 0 Gluteal cleft: 1cm x 0.2cm x 0.1cm Wound bed:see description of back wound Gluteal cleft; pink, clean, moist  Drainage (amount, consistency, odor) minimal; non purulent Periwound:intact; no surrounding induration or fluctuation noted in the the gluteal cleft  Dressing procedure/placement/frequency: Enzymatic debridement for the upper back wound daily until wound bed clean Silicone foam for the gluteal cleft.  Staff to notified MD if any spontaneous opening or drainage at this site.  Would need surgical consultation to address a pilonidal cyst.   Discussed POC with patient and bedside nurse.  Re consult if needed, will not follow at this time. Thanks  Marlaya Turck [employer], RN,CWOCN, CNS, [place] [phone])

## 2021-07-27 NOTE — Progress Notes (Signed)
[facility] for Infectious Disease  Date of Admission:  07/22/2021     Lines:  Peripheral iv's   Abx: 9/29-c cefazolin                                                                Assessment: 45 yo male with dm2, peripheral neuropathy/retinopathy, ckd3 (bl cr 1.1-1.2), hx covid infection 04/2021, admitted 9/28 for chest pain radiating to his shoulders for the past 6 weeks found to have ct imaging lytic lesion t5-6/sternum concerning for MM, and also mssa bacteremia   9/28 bcx mssa 9/30 bcx negative  Tte no obvious vegetation; normal lvef  Left upper back lesion appears to be staph associated carbuncle -- ruptured and packed  He has chronic ulcer left foot with mild erythema around it could be source for mssa bsi but agreed with podiatry that no further investigation needed at this time or surgical debridement  -------- 10/03 assessment Discussed with oncology/primary team that his presentation is rather atypical (in terms of lack of sepsis and late presentation for such a healthy male with only diabetes that appears well controlled). I am concern still another primary osseous process that is superinfected by mssa  I placed IR evaluation consult for vertebral body (not disc) biopsy today 10/03  I also discussed with his wife ID treatment/diagnostic plan including tee         Plan: Continue cefazolin Await tee IR consult placed for vertebral bone biopsy/pathology Further oncologic w/u per oncology   I spent 60 minute reviewing data/chart, and coordinating care and >50% direct face to face time providing counseling/discussing diagnostics/treatment plan with patient    Principal Problem:   Lytic bone lesions on xray Active Problems:   Type 2 diabetes mellitus with diabetic polyneuropathy, without long-term current use of insulin ([facility])   Hyperlipidemia   GAD (generalized anxiety disorder)   AKI (acute kidney injury) ([place])   Anemia   MSSA bacteremia    Constipation   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)   No Known Allergies  Scheduled Meds:  amLODipine  5 mg Oral Daily   atorvastatin  40 mg Oral Daily   collagenase   Topical Daily   cyanocobalamin  1,000 mcg Subcutaneous Daily   cyclobenzaprine  10 mg Oral BID   feeding supplement  237 mL Oral BID BM   fentaNYL  1 patch Transdermal Q72H   gabapentin  200 mg Oral TID   heparin  5,000 Units Subcutaneous Q8H   hydrALAZINE  50 mg Oral Q8H   insulin aspart  0-9 Units Subcutaneous Q6H   lactulose  20 g Oral BID   multivitamin with minerals  1 tablet Oral Daily   mupirocin ointment  1 application Topical BID   senna-docusate  1 tablet Oral BID   traZODone  100 mg Oral QHS   verapamil  120 mg Oral Daily   Continuous Infusions:   ceFAZolin (ANCEF) IV 2 g (07/27/21 1358)   chlorproMAZINE (THORAZINE) IV 25 mg (07/26/21 0854)   PRN Meds:.acetaminophen, albuterol, ALPRAZolam, chlorproMAZINE (THORAZINE) IV, hydrALAZINE, HYDROmorphone (DILAUDID) injection, oxyCODONE   SUBJECTIVE: Overall hasn't felt much different since admission -- asked more he reports the pain in back/chest  Left upper back carbuncle ruptured  Pilonidal cyst history with some pain locally  No f/c Wbc improving  Tee pending later this week  Bone marrow biopsy no longer planned  No n/v/diarrhea  Review of Systems: ROS All other ROS was negative, except mentioned above     OBJECTIVE: Vitals:   07/27/21 1132 07/27/21 1133 07/27/21 1310 07/27/21 1422  BP: (!) 192/97 (!) 192/97 (!) 148/81 (!) 145/82  Pulse:  (!) 113 (!) 105 (!) 106  Resp:  (!) 28 18 18   Temp:  98.4 F (36.9 C) 98.2 F (36.8 C) 97.7 F (36.5 C)  TempSrc:  Oral Oral Oral  SpO2:  96% 96% 94%  Weight:      Height:       Body mass index is 26.36 kg/m.  Physical Exam  General/constitutional: mild distress trying to turn in bed due to chest/back pain; conversant; cooperative HEENT: Normocephalic, PER, Conj Clear, EOMI,  Oropharynx clear Neck supple CV: rrr no mrg Lungs: clear to auscultation, normal respiratory effort Abd: Soft, Nontender Ext: no edema Skin: small carbuncle upper chest that had ruptured -- packing in place; no induration; midline slight swelling/serous dishcarge at sacral area in setting known previous pilonidal cyst; left toe stable well demarcated erythema and eschar over hallux ulcer Neuro: nonfocal MSK: no peripheral joint swelling/tenderness/warmth; mid thoracic midline and mid sternal tenderness    Lab Results Lab Results  Component Value Date   WBC 12.8 (H) 07/27/2021   HGB 7.8 (L) 07/27/2021   HCT 23.5 (L) 07/27/2021   MCV 84.8 07/27/2021   PLT 291 07/27/2021    Lab Results  Component Value Date   CREATININE 2.09 (H) 07/27/2021   BUN 27 (H) 07/27/2021   NA 137 07/27/2021   K 4.7 07/27/2021   CL 104 07/27/2021   CO2 20 (L) 07/27/2021    Lab Results  Component Value Date   ALT 19 05/20/2021   AST 17 05/20/2021   ALKPHOS 69 05/20/2021   BILITOT 0.7 05/20/2021      Microbiology: Recent Results (from the past 240 hour(s))  Culture, blood (routine x 2)     Status: Abnormal   Collection Time: 07/22/21  2:11 PM   Specimen: BLOOD  Result Value Ref Range Status   Specimen Description   Final    BLOOD RIGHT ANTECUBITAL Performed at [facility], [address], [place], [place] [address]    Special Requests   Final    BOTTLES DRAWN AEROBIC AND ANAEROBIC Blood Culture results may not be optimal due to an excessive volume of blood received in culture bottles Performed at [facility] [address], [place], [place] [address]    Culture  Setup Time   Final    GRAM POSITIVE COCCI IN CLUSTERS IN BOTH AEROBIC AND ANAEROBIC BOTTLES CRITICAL RESULT CALLED TO, READ BACK BY AND VERIFIED WITH: PHARMD M.SWAYME AT 8676 ON 07/23/2021 BY T.SAAD. Performed at [facility], [place] [address] [place], [place] [address]    Culture  STAPHYLOCOCCUS AUREUS (A)  Final   Report Status 07/25/2021 FINAL  Final   Organism ID, Bacteria STAPHYLOCOCCUS AUREUS  Final      Susceptibility   Staphylococcus aureus - MIC*    CIPROFLOXACIN <=0.5 SENSITIVE Sensitive     ERYTHROMYCIN RESISTANT Resistant     GENTAMICIN <=0.5 SENSITIVE Sensitive     OXACILLIN <=0.25 SENSITIVE Sensitive     TETRACYCLINE <=1 SENSITIVE Sensitive     VANCOMYCIN 1 SENSITIVE Sensitive     TRIMETH/SULFA <=10  SENSITIVE Sensitive     CLINDAMYCIN RESISTANT Resistant     RIFAMPIN <=0.5 SENSITIVE Sensitive     Inducible Clindamycin POSITIVE Resistant     * STAPHYLOCOCCUS AUREUS  Blood Culture ID Panel (Reflexed)     Status: Abnormal   Collection Time: 07/22/21  2:11 PM  Result Value Ref Range Status   Enterococcus faecalis NOT DETECTED NOT DETECTED Final   Enterococcus Faecium NOT DETECTED NOT DETECTED Final   Listeria monocytogenes NOT DETECTED NOT DETECTED Final   Staphylococcus species DETECTED (A) NOT DETECTED Final    Comment: CRITICAL RESULT CALLED TO, READ BACK BY AND VERIFIED WITH: PHARMD M.SWAYME AT 1547 ON 07/23/2021 BY T.SAAD.    Staphylococcus aureus (BCID) DETECTED (A) NOT DETECTED Final    Comment: CRITICAL RESULT CALLED TO, READ BACK BY AND VERIFIED WITH: PHARMD M.SWAYME AT 1547 ON 07/23/2021 BY T.SAAD.    Staphylococcus epidermidis NOT DETECTED NOT DETECTED Final   Staphylococcus lugdunensis NOT DETECTED NOT DETECTED Final   Streptococcus species NOT DETECTED NOT DETECTED Final   Streptococcus agalactiae NOT DETECTED NOT DETECTED Final   Streptococcus pneumoniae NOT DETECTED NOT DETECTED Final   Streptococcus pyogenes NOT DETECTED NOT DETECTED Final   A.calcoaceticus-baumannii NOT DETECTED NOT DETECTED Final   Bacteroides fragilis NOT DETECTED NOT DETECTED Final   Enterobacterales NOT DETECTED NOT DETECTED Final   Enterobacter cloacae complex NOT DETECTED NOT DETECTED Final   Escherichia coli NOT DETECTED NOT DETECTED Final   Klebsiella  aerogenes NOT DETECTED NOT DETECTED Final   Klebsiella oxytoca NOT DETECTED NOT DETECTED Final   Klebsiella pneumoniae NOT DETECTED NOT DETECTED Final   Proteus species NOT DETECTED NOT DETECTED Final   Salmonella species NOT DETECTED NOT DETECTED Final   Serratia marcescens NOT DETECTED NOT DETECTED Final   Haemophilus influenzae NOT DETECTED NOT DETECTED Final   Neisseria meningitidis NOT DETECTED NOT DETECTED Final   Pseudomonas aeruginosa NOT DETECTED NOT DETECTED Final   Stenotrophomonas maltophilia NOT DETECTED NOT DETECTED Final   Candida albicans NOT DETECTED NOT DETECTED Final   Candida auris NOT DETECTED NOT DETECTED Final   Candida glabrata NOT DETECTED NOT DETECTED Final   Candida krusei NOT DETECTED NOT DETECTED Final   Candida parapsilosis NOT DETECTED NOT DETECTED Final   Candida tropicalis NOT DETECTED NOT DETECTED Final   Cryptococcus neoformans/gattii NOT DETECTED NOT DETECTED Final   Meth resistant mecA/C and MREJ NOT DETECTED NOT DETECTED Final    Comment: Performed at [facility], [address], [place], [place] [address]  Culture, blood (routine x 2)     Status: None   Collection Time: 07/22/21  3:13 PM   Specimen: BLOOD  Result Value Ref Range Status   Specimen Description   Final    BLOOD LEFT ANTECUBITAL Performed at [facility] [address], [place], [place] [address]    Special Requests   Final    BOTTLES DRAWN AEROBIC ONLY Blood Culture results may not be optimal due to an inadequate volume of blood received in culture bottles Performed at [facility] [address] [place], [place] [address]    Culture   Final    NO GROWTH 5 DAYS Performed at [facility], [place] [address], [place], [place] [address]    Report Status 07/27/2021 FINAL  Final  SARS CORONAVIRUS 2 (TAT 6-24 HRS) Nasopharyngeal     Status: None   Collection Time: 07/22/21  6:07 PM   Specimen: Nasopharyngeal  Result Value Ref Range  Status   SARS Coronavirus 2 NEGATIVE NEGATIVE Final    Comment: (NOTE) SARS-CoV-2 target nucleic acids are NOT DETECTED.  The SARS-CoV-2 RNA is generally detectable in upper and lower respiratory specimens during the acute phase of infection. Negative results do not preclude SARS-CoV-2 infection, do not rule out co-infections with other pathogens, and should not be used as the sole basis for treatment or other patient management decisions. Negative results must be combined with clinical observations, patient history, and epidemiological information. The expected result is Negative.  Fact Sheet for Patients: SugarRoll.be  Fact Sheet for Healthcare Providers: https://www.woods-mathews.com/  This test is not yet approved or cleared by the [place] FDA and  has been authorized for detection and/or diagnosis of SARS-CoV-2 by FDA under an Emergency Use Authorization (EUA). This EUA will remain  in effect (meaning this test can be used) for the duration of the COVID-19 declaration under Se ction 564(b)(1) of the Act, 21 U.S.C. section 360bbb-3(b)(1), unless the authorization is terminated or revoked sooner.  Performed at [facility], [place] [address] [place], [place] [address]   Culture, blood (routine x 2)     Status: None (Preliminary result)   Collection Time: 07/24/21  5:04 PM   Specimen: BLOOD  Result Value Ref Range Status   Specimen Description   Final    BLOOD RIGHT ANTECUBITAL Performed at [facility] [address] [place], [place] [address]    Special Requests   Final    BOTTLES DRAWN AEROBIC AND ANAEROBIC Blood Culture adequate volume Performed at [facility] [address], [place], [place] [address]    Culture   Final    NO GROWTH 3 DAYS Performed at [facility], [place] [address], [place], [place] [address]    Report Status PENDING  Incomplete  Culture, blood  (routine x 2)     Status: None (Preliminary result)   Collection Time: 07/24/21  5:04 PM   Specimen: BLOOD  Result Value Ref Range Status   Specimen Description   Final    BLOOD LEFT ANTECUBITAL Performed at [facility] [address] [place], [place] [address]    Special Requests   Final    BOTTLES DRAWN AEROBIC AND ANAEROBIC Blood Culture adequate volume Performed at [facility] [address], [place], [place] [address]    Culture   Final    NO GROWTH 3 DAYS Performed at [facility], [place] [address] [place], [place] [address]    Report Status PENDING  Incomplete     Serology:   Imaging: If present, new imagings (plain films, ct scans, and mri) have been personally visualized and interpreted; radiology reports have been reviewed. Decision making incorporated into the Impression / Recommendations.  9/30 tte 1. Left ventricular ejection fraction, by estimation, is >75%. The left  ventricle has hyperdynamic function. The left ventricle has no regional  wall motion abnormalities. Left ventricular diastolic parameters are  indeterminate.   2. Right ventricular systolic function is normal. The right ventricular  size is normal. There is normal pulmonary artery systolic pressure. The  estimated right ventricular systolic pressure is 99.8 mmHg.   3. The mitral valve is grossly normal. No evidence of mitral valve  regurgitation.   4. The aortic valve is tricuspid. Aortic valve regurgitation is not  visualized.   5. The inferior vena cava is normal in size with <50% respiratory  variability, suggesting right atrial pressure of  8 mmHg.    9/29 ct abd pelv 1. No acute intra-abdominal or intrapelvic process. 2. Moderate fecal retention consistent with constipation. 3.  Aortic Atherosclerosis    9/29 mri lumbar thoracic spine 1. Abnormal bone marrow signal and contrast enhancement at the T4 and T5 levels with mild edema of the disc  space and prevertebral phlegmon/hematoma. The findings could indicate osseous metastatic disease, multiple myeloma or discitis-osteomyelitis. 2. Minimally depressed compression fracture of T5 without retropulsion. 3. No epidural abscess. 4. No spinal canal or neural foraminal stenosis   9/28 vq scan No evidence for PE   9/28 ct chest 1. 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 There is a pathologic fracture of T6 with paraspinal hematoma or tumor. This could represent metastatic bone disease or possibly myeloma. 2. No obvious primary neoplasm involving the chest or upper abdomen. PET-CT may be helpful for further evaluation and extent of the bone disease and possibly detecting a primary neoplasm. 3. Thoracic spine MRI without and with contrast may be helpful for further evaluation of the thoracic disease. I do not see any any obvious spinal canal compromise   Jabier Mutton, [place] for [place] [phone] pager    07/27/2021, 2:38 PM

## 2021-07-28 ENCOUNTER — Ambulatory Visit (HOSPITAL_COMMUNITY)
Admission: RE | Admit: 2021-07-28 | Discharge: 2021-07-28 | Disposition: A | Discharge status: Home / Self Care | Payer: 59 | Source: Ambulatory Visit | Attending: Internal Medicine | Admitting: Internal Medicine

## 2021-07-28 ENCOUNTER — Inpatient Hospital Stay (HOSPITAL_COMMUNITY): Payer: 59

## 2021-07-28 ENCOUNTER — Ambulatory Visit (HOSPITAL_COMMUNITY): Payer: 59

## 2021-07-28 DIAGNOSIS — M869 Osteomyelitis, unspecified: Secondary | ICD-10-CM | POA: Insufficient documentation

## 2021-07-28 DIAGNOSIS — D649 Anemia, unspecified: Secondary | ICD-10-CM | POA: Diagnosis not present

## 2021-07-28 DIAGNOSIS — K5900 Constipation, unspecified: Secondary | ICD-10-CM | POA: Diagnosis not present

## 2021-07-28 DIAGNOSIS — M899 Disorder of bone, unspecified: Secondary | ICD-10-CM | POA: Diagnosis not present

## 2021-07-28 DIAGNOSIS — N179 Acute kidney failure, unspecified: Secondary | ICD-10-CM | POA: Diagnosis not present

## 2021-07-28 DIAGNOSIS — Z515 Encounter for palliative care: Secondary | ICD-10-CM | POA: Diagnosis not present

## 2021-07-28 DIAGNOSIS — M898X9 Other specified disorders of bone, unspecified site: Secondary | ICD-10-CM

## 2021-07-28 HISTORY — PX: IR FLUORO GUIDED NEEDLE PLC ASPIRATION/INJECTION LOC: IMG2395

## 2021-07-28 LAB — CBC WITH DIFFERENTIAL/PLATELET
Abs Immature Granulocytes: 0.05 10*3/uL (ref 0.00–0.07)
Basophils Absolute: 0 10*3/uL (ref 0.0–0.1)
Basophils Relative: 0 %
Eosinophils Absolute: 0.4 10*3/uL (ref 0.0–0.5)
Eosinophils Relative: 3 %
HCT: 25.2 % — ABNORMAL LOW (ref 39.0–52.0)
Hemoglobin: 8.4 g/dL — ABNORMAL LOW (ref 13.0–17.0)
Immature Granulocytes: 0 %
Lymphocytes Relative: 10 %
Lymphs Abs: 1.2 10*3/uL (ref 0.7–4.0)
MCH: 28 pg (ref 26.0–34.0)
MCHC: 33.3 g/dL (ref 30.0–36.0)
MCV: 84 fL (ref 80.0–100.0)
Monocytes Absolute: 1.5 10*3/uL — ABNORMAL HIGH (ref 0.1–1.0)
Monocytes Relative: 12 %
Neutro Abs: 9.3 10*3/uL — ABNORMAL HIGH (ref 1.7–7.7)
Neutrophils Relative %: 75 %
Platelets: 358 10*3/uL (ref 150–400)
RBC: 3 MIL/uL — ABNORMAL LOW (ref 4.22–5.81)
RDW: 13.2 % (ref 11.5–15.5)
WBC: 12.5 10*3/uL — ABNORMAL HIGH (ref 4.0–10.5)
nRBC: 0 % (ref 0.0–0.2)

## 2021-07-28 LAB — BASIC METABOLIC PANEL
Anion gap: 9 (ref 5–15)
BUN: 26 mg/dL — ABNORMAL HIGH (ref 6–20)
CO2: 23 mmol/L (ref 22–32)
Calcium: 9 mg/dL (ref 8.9–10.3)
Chloride: 103 mmol/L (ref 98–111)
Creatinine, Ser: 1.93 mg/dL — ABNORMAL HIGH (ref 0.61–1.24)
GFR, Estimated: 42 mL/min — ABNORMAL LOW (ref >60)
Glucose, Bld: 134 mg/dL — ABNORMAL HIGH (ref 70–99)
Potassium: 4 mmol/L (ref 3.5–5.1)
Sodium: 135 mmol/L (ref 135–145)

## 2021-07-28 LAB — UPEP/UIFE/LIGHT CHAINS/TP, 24-HR UR
% BETA, Urine: 12 %
ALPHA 1 URINE: 2.2 %
Albumin, U: 51.9 %
Alpha 2, Urine: 16.3 %
Free Kappa Lt Chains,Ur: 278.99 mg/L — ABNORMAL HIGH (ref 1.17–86.46)
Free Kappa/Lambda Ratio: 4.72 (ref 1.83–14.26)
Free Lambda Lt Chains,Ur: 59.16 mg/L — ABNORMAL HIGH (ref 0.27–15.21)
GAMMA GLOBULIN URINE: 17.6 %
Total Protein, Urine-Ur/day: 2286 mg/24 hr — ABNORMAL HIGH (ref 30–150)
Total Protein, Urine: 190.5 mg/dL
Total Volume: 1200

## 2021-07-28 LAB — GLUCOSE, CAPILLARY
Glucose-Capillary: 165 mg/dL — ABNORMAL HIGH (ref 70–99)
Glucose-Capillary: 135 mg/dL — ABNORMAL HIGH (ref 70–99)
Glucose-Capillary: 156 mg/dL — ABNORMAL HIGH (ref 70–99)
Glucose-Capillary: 155 mg/dL — ABNORMAL HIGH (ref 70–99)
Glucose-Capillary: 180 mg/dL — ABNORMAL HIGH (ref 70–99)

## 2021-07-28 MED ORDER — FENTANYL 25 MCG/HR TD PT72
1.0000 | MEDICATED_PATCH | TRANSDERMAL | Status: DC
  Administered 2021-07-28: 1 via TRANSDERMAL
  Filled 2021-07-28: qty 1

## 2021-07-28 MED ORDER — ONDANSETRON HCL 4 MG/2ML IJ SOLN
INTRAMUSCULAR | Status: DC | PRN
  Administered 2021-07-28: 4 mg via INTRAVENOUS

## 2021-07-28 MED ORDER — AMLODIPINE BESYLATE 10 MG PO TABS
10.0000 mg | ORAL_TABLET | Freq: Every day | ORAL | Status: DC
  Administered 2021-07-28 – 2021-08-03 (×7): 10 mg via ORAL
  Filled 2021-07-28 (×7): qty 1

## 2021-07-28 MED ORDER — MIDAZOLAM HCL 2 MG/2ML IJ SOLN
INTRAMUSCULAR | Status: DC | PRN
  Administered 2021-07-28: 1 mg via INTRAVENOUS
  Administered 2021-07-28: .5 mg via INTRAVENOUS

## 2021-07-28 MED ORDER — FUROSEMIDE 10 MG/ML IJ SOLN
40.0000 mg | Freq: Three times a day (TID) | INTRAMUSCULAR | Status: AC
  Administered 2021-07-28 (×2): 40 mg via INTRAVENOUS
  Filled 2021-07-28 (×2): qty 4

## 2021-07-28 MED ORDER — FENTANYL CITRATE (PF) 100 MCG/2ML IJ SOLN
INTRAMUSCULAR | Status: DC | PRN
  Administered 2021-07-28: 50 ug via INTRAVENOUS
  Administered 2021-07-28 (×2): 25 ug via INTRAVENOUS
  Administered 2021-07-29: 50 ug via INTRAVENOUS

## 2021-07-28 NOTE — Sedation Documentation (Signed)
Pt reports pain 10/10

## 2021-07-28 NOTE — Progress Notes (Signed)
Pt scheduled for a TEE tomorrow 07/29/2021 and [facility] hospital. Pt to be transported to [facility] via [facility] for the procedure. Pt's RN, Lenna Sciara, made aware. Jobe Igo, RN

## 2021-07-28 NOTE — Progress Notes (Addendum)
   Scheduled today for Thoracic 5 bone lesion biopsy Risks and benefits of T5 bone lesion biopsy was discussed with the patient and/or patient's family including, but not limited to bleeding, infection, damage to adjacent structures or low yield requiring additional tests.  All of the questions were answered and there is agreement to proceed.  Consent signed and in chart.

## 2021-07-28 NOTE — Progress Notes (Signed)
  Shared Decision Making/Informed Consent  The risks [esophageal damage, perforation (1:10,000 risk), bleeding, pharyngeal hematoma as well as other potential complications associated with conscious sedation including aspiration, arrhythmia, respiratory failure and death, benefits (treatment guidance and diagnostic support) and alternatives of a transesophageal echocardiogram were discussed in detail with Jose [name] and he is willing to proceed.    Phone consent obtained, witnessed by Ms Mancel Bale NP-BC. NPO after midnight, TEE scheduled tomorrow at 130 PM, patient will be transferred from [facility] to [facility].

## 2021-07-28 NOTE — Progress Notes (Signed)
HEMATOLOGY-ONCOLOGY PROGRESS NOTE  SUBJECTIVE: Pt underwent T5 bone biopsy today, he felt the procedure was hard on him. He was little drowsy when I saw him probably due to pain medication.  REVIEW OF SYSTEMS:   Constitutional: Denies fevers, chills  Eyes: Denies blurriness of vision Ears, nose, mouth, throat, and face: Denies mucositis or sore throat Respiratory: Denies cough, dyspnea or wheezes Cardiovascular: Denies palpitation, chest discomfort Gastrointestinal:  Denies nausea, heartburn or change in bowel habits Skin: Denies abnormal skin rashes Lymphatics: Denies new lymphadenopathy or easy bruising Neurological: Has baseline peripheral neuropathy Behavioral/Psych: Mood is stable, no new changes  Extremities: No lower extremity edema, (+) mid back pain  All other systems were reviewed with the patient and are negative.  I have reviewed the past medical history, past surgical history, social history and family history with the patient and they are unchanged from previous note.   PHYSICAL EXAMINATION: ECOG PERFORMANCE STATUS: 1 - Symptomatic but completely ambulatory  Vitals:   07/28/21 1419 07/28/21 2052  BP: (!) 166/92 135/80  Pulse: (!) 112 (!) 105  Resp: 19 19  Temp: 98.2 F (36.8 C) 98.1 F (36.7 C)  SpO2: (!) 88% 93%   Filed Weights   07/25/21 0528 07/26/21 0526 07/27/21 0622  Weight: 193 lb 2 oz (87.6 kg) 187 lb (84.8 kg) 189 lb (85.7 kg)    Intake/Output from previous day: 10/03 0701 - 10/04 0700 In: 660 [P.O.:360; IV Piggyback:300] Out: 750 [Urine:750]  GENERAL:alert, no distress and comfortable SKIN: skin color, texture, turgor are normal, no rashes or significant lesions LUNGS: clear to auscultation and percussion with normal breathing effort HEART: regular rate & rhythm and no murmurs and no lower extremity edema ABDOMEN:abdomen soft, non-tender and normal bowel sounds NEURO: alert & oriented x 3 with fluent speech, no focal motor/sensory  deficits  LABORATORY DATA:  I have reviewed the data as listed CMP Latest Ref Rng & Units 07/28/2021 07/27/2021 07/26/2021  Glucose 70 - 99 mg/dL 134(H) 158(H) 222(H)  BUN 6 - 20 mg/dL 26(H) 27(H) 25(H)  Creatinine 0.61 - 1.24 mg/dL 1.93(H) 2.09(H) 2.02(H)  Sodium 135 - 145 mmol/L 135 137 137  Potassium 3.5 - 5.1 mmol/L 4.0 4.7 4.4  Chloride 98 - 111 mmol/L 103 104 103  CO2 22 - 32 mmol/L 23 20(L) 24  Calcium 8.9 - 10.3 mg/dL 9.0 9.1 9.3  Total Protein 6.5 - 8.1 g/dL - - -  Total Bilirubin 0.3 - 1.2 mg/dL - - -  Alkaline Phos 38 - 126 U/L - - -  AST 15 - 41 U/L - - -  ALT 0 - 44 U/L - - -    Lab Results  Component Value Date   WBC 12.5 (H) 07/28/2021   HGB 8.4 (L) 07/28/2021   HCT 25.2 (L) 07/28/2021   MCV 84.0 07/28/2021   PLT 358 07/28/2021   NEUTROABS 9.3 (H) 07/28/2021    CT ABDOMEN PELVIS WO CONTRAST  Result Date: 07/23/2021 CLINICAL DATA:  Cancer of unknown primary, lytic thoracic spine lesions EXAM: CT ABDOMEN AND PELVIS WITHOUT CONTRAST TECHNIQUE: Multidetector CT imaging of the abdomen and pelvis was performed following the standard protocol without IV contrast. Unenhanced CT was performed per clinician order. Lack of IV contrast limits sensitivity and specificity, especially for evaluation of abdominal/pelvic solid viscera. COMPARISON:  07/22/2021 FINDINGS: Lower chest: Hypoventilatory changes at the dependent lower lobes. No acute pleural or parenchymal lung disease. Hepatobiliary: No gross liver abnormalities on this unenhanced exam. Gallbladder is moderately distended without  cholelithiasis or cholecystitis. Pancreas: Unremarkable. No pancreatic ductal dilatation or surrounding inflammatory changes. Spleen: Normal in size without focal abnormality. Adrenals/Urinary Tract: No urinary tract calculi or obstructive uropathy. Excreted contrast within the bladder consistent with preceding MRI. The adrenals are unremarkable. Stomach/Bowel: No bowel obstruction or ileus. Moderate  retained stool throughout the colon. The appendix is surgically absent. No bowel wall thickening or inflammatory change. Vascular/Lymphatic: Aortic atherosclerosis. No enlarged abdominal or pelvic lymph nodes. Reproductive: Prostate is unremarkable. Other: No free fluid or free gas. Small fat containing bilateral inguinal hernias. No bowel herniation. Musculoskeletal: There are no acute or destructive bony lesions. Reconstructed images demonstrate no additional findings. IMPRESSION: 1. No acute intra-abdominal or intrapelvic process. 2. Moderate fecal retention consistent with constipation. 3.  Aortic Atherosclerosis (ICD10-I70.0). Electronically Signed   By: Randa Ngo M.D.   On: 07/23/2021 22:26   DG Chest 2 View  Result Date: 07/22/2021 CLINICAL DATA:  chest pain EXAM: CHEST - 2 VIEW COMPARISON:  None. FINDINGS: The cardiomediastinal silhouette is within normal limits. No pleural effusion. No pneumothorax. No lobar consolidation. Minimal left base subsegmental atelectasis. No acute osseous abnormality. IMPRESSION: Minimal left lung base subsegmental atelectasis. No other acute abnormality in the chest. Electronically Signed   By: Albin Felling M.D.   On: 07/22/2021 11:05   CT HEAD WO CONTRAST (5MM)  Result Date: 07/27/2021 CLINICAL DATA:  Dizziness EXAM: CT HEAD WITHOUT CONTRAST TECHNIQUE: Contiguous axial images were obtained from the base of the skull through the vertex without intravenous contrast. COMPARISON:  None. FINDINGS: Brain: No evidence of acute infarction, hemorrhage, hydrocephalus, extra-axial collection or mass lesion/mass effect. Vascular: No hyperdense vessel or unexpected calcification. Skull: Normal. Negative for fracture or focal lesion. Sinuses/Orbits: No acute finding. Other: None. IMPRESSION: No acute intracranial abnormality noted. Electronically Signed   By: Inez Catalina M.D.   On: 07/27/2021 20:55   CT Chest Wo Contrast  Result Date: 07/22/2021 CLINICAL DATA:  Chest pain  EXAM: CT CHEST WITHOUT CONTRAST TECHNIQUE: Multidetector CT imaging of the chest was performed following the standard protocol without IV contrast. COMPARISON:  None. FINDINGS: Cardiovascular: The heart is within normal limits in size. No pericardial effusion the aorta is normal in caliber. Minimal scattered atherosclerotic calcifications. Age advanced coronary artery calcifications. Mediastinum/Nodes: Small scattered mediastinal and hilar lymph nodes but no mass or overt adenopathy. Borderline enlarged subcarinal node measuring 12 mm could be related to the thoracic spine process. The esophagus is grossly normal. Lungs/Pleura: Areas of streaky bibasilar atelectasis. No focal airspace consolidation/pneumonia. No worrisome pulmonary lesions. No pleural effusions or pleural lesions. Upper Abdomen: No significant upper abdominal findings. Musculoskeletal: No chest wall mass, supraclavicular or axillary adenopathy. There are several small bilateral thyroid nodules. None of these measures greater than 12 mm. Not clinically significant; no follow-up imaging recommended (ref: J Am Coll Radiol. 2015 Feb;12(2): 143-50). There are lytic destructive bone lesions involving the T5 and T6 vertebral bodies with a mild pathologic compression fracture of T6. Paraspinal hematoma or tumor noted at both these levels. No definite findings for spinal canal compromise. Mild lucency involving the sternum could also reflect lytic bone disease. I do not see any obvious rib lesions or pathologic rib fractures. IMPRESSION: 1. 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 There is a pathologic fracture of T6 with paraspinal hematoma or tumor. This could represent metastatic bone disease or possibly myeloma. 2. No obvious primary neoplasm involving the chest or upper abdomen. PET-CT may be helpful for further evaluation and  extent of the bone disease and possibly detecting a primary neoplasm. 3. Thoracic spine MRI without and  with contrast may be helpful for further evaluation of the thoracic disease. I do not see any any obvious spinal canal compromise. Aortic Atherosclerosis (ICD10-I70.0). Electronically Signed   By: Marijo Sanes M.D.   On: 07/22/2021 16:51   MR THORACIC SPINE W WO CONTRAST  Result Date: 07/23/2021 CLINICAL DATA:  Bone lesion, T-spine, malignancy suspected; Bone lesion, L/S-spine, malignancy suspected EXAM: MRI THORACIC AND LUMBAR SPINE WITHOUT AND WITH CONTRAST TECHNIQUE: Multiplanar and multiecho pulse sequences of the thoracic and lumbar spine were obtained without and with intravenous contrast. CONTRAST:  48m GADAVIST GADOBUTROL 1 MMOL/ML IV SOLN COMPARISON:  None. FINDINGS: MRI THORACIC SPINE FINDINGS Alignment:  Physiologic. Vertebrae: There is abnormal bone marrow signal and contrast enhancement at T4 and T5. There is mild edema of the disc space. There is a minimally depressed fracture of the superior T5 endplate. Cord:  Normal signal and morphology. Paraspinal and other soft tissues: There is phlegmon like contrast enhancement of the prevertebral soft tissues at the T4 and T5 levels. No epidural abscess. Disc levels: There is no spinal canal or neural foraminal stenosis. MRI LUMBAR SPINE FINDINGS Segmentation:  Standard. Alignment:  Physiologic. Vertebrae:  No fracture, evidence of discitis, or bone lesion. Conus medullaris: Extends to the L1 level and appears normal. Paraspinal and other soft tissues: Negative. Disc levels: No spinal canal or neural foraminal stenosis. IMPRESSION: 1. Abnormal bone marrow signal and contrast enhancement at the T4 and T5 levels with mild edema of the disc space and prevertebral phlegmon/hematoma. The findings could indicate osseous metastatic disease, multiple myeloma or discitis-osteomyelitis. 2. Minimally depressed compression fracture of T5 without retropulsion. 3. No epidural abscess. 4. No spinal canal or neural foraminal stenosis. Electronically Signed   By: KUlyses JarredM.D.   On: 07/23/2021 19:55   MR Lumbar Spine W Wo Contrast  Result Date: 07/23/2021 CLINICAL DATA:  Bone lesion, T-spine, malignancy suspected; Bone lesion, L/S-spine, malignancy suspected EXAM: MRI THORACIC AND LUMBAR SPINE WITHOUT AND WITH CONTRAST TECHNIQUE: Multiplanar and multiecho pulse sequences of the thoracic and lumbar spine were obtained without and with intravenous contrast. CONTRAST:  190mGADAVIST GADOBUTROL 1 MMOL/ML IV SOLN COMPARISON:  None. FINDINGS: MRI THORACIC SPINE FINDINGS Alignment:  Physiologic. Vertebrae: There is abnormal bone marrow signal and contrast enhancement at T4 and T5. There is mild edema of the disc space. There is a minimally depressed fracture of the superior T5 endplate. Cord:  Normal signal and morphology. Paraspinal and other soft tissues: There is phlegmon like contrast enhancement of the prevertebral soft tissues at the T4 and T5 levels. No epidural abscess. Disc levels: There is no spinal canal or neural foraminal stenosis. MRI LUMBAR SPINE FINDINGS Segmentation:  Standard. Alignment:  Physiologic. Vertebrae:  No fracture, evidence of discitis, or bone lesion. Conus medullaris: Extends to the L1 level and appears normal. Paraspinal and other soft tissues: Negative. Disc levels: No spinal canal or neural foraminal stenosis. IMPRESSION: 1. Abnormal bone marrow signal and contrast enhancement at the T4 and T5 levels with mild edema of the disc space and prevertebral phlegmon/hematoma. The findings could indicate osseous metastatic disease, multiple myeloma or discitis-osteomyelitis. 2. Minimally depressed compression fracture of T5 without retropulsion. 3. No epidural abscess. 4. No spinal canal or neural foraminal stenosis. Electronically Signed   By: KeUlyses Jarred.D.   On: 07/23/2021 19:55   NM Pulmonary Perfusion  Result Date: 07/22/2021 CLINICAL DATA:  PE suspected, low/intermediate probability positive D-dimer. EXAM: NUCLEAR MEDICINE PERFUSION LUNG  SCAN TECHNIQUE: Perfusion images were obtained in multiple projections after intravenous injection of radiopharmaceutical. Ventilation scans intentionally deferred if perfusion scan and chest x-ray adequate for interpretation during COVID 19 epidemic. RADIOPHARMACEUTICALS:  4.4 mCi Tc-56mMAA IV COMPARISON:  None. FINDINGS: Normal perfusion.  No focal defects. IMPRESSION: No evidence for PE. Electronically Signed   By: CSan MorelleM.D.   On: 07/22/2021 16:26   IR Fluoro Guide Ndl Plmt / BX  Result Date: 07/28/2021 INDICATION: Jose GREULICHis a 45y.o. male with PMH of DM2, retinopathy, neuropathy, microalbuminuria, HTN, HLD, CKD 3, COVID-19 infection July 2022. Patient presented to ED 07/22/2021 complaining of chest pain that radiated to both shoulder blades for approximately 6 weeks. Chest CT with contrast was significant for lytic bone disease involving T4 and T5 and possibly the sternum with a superior endplate fracture at T5 concerning for malignancy versus osteomyelitis in the setting of MSSA bacteremia. She comes to our service today for a T5 fluoroscopy guided or bone biopsy. EXAM: FLUOROSCOPY GUIDED T8 VERTEBRAL BODY CORE BONE BIOPSY MEDICATIONS: None. ANESTHESIA/SEDATION: Moderate (conscious) sedation was employed during this procedure. A total of Versed 1.5 mg and Fentanyl 100 mcg was administered intravenously. Moderate Sedation Time: 12 minutes. The patient's level of consciousness and vital signs were monitored continuously by radiology nursing throughout the procedure under my direct supervision. FLUOROSCOPY TIME:  Fluoroscopy Time: 7 minutes 18 seconds; DAP (430 mGy). COMPLICATIONS: None immediate. PROCEDURE: Informed written consent was obtained from the patient after a thorough discussion of the procedural risks, benefits and alternatives. All questions were addressed. Maximal Sterile Barrier Technique was utilized including caps, mask, sterile gowns, sterile gloves, sterile drape,  hand hygiene and skin antiseptic. A timeout was performed prior to the initiation of the procedure. The patient was placed in prone position on the angiography table. The thoracic spine region was prepped and draped in a sterile fashion. Under fluoroscopy, the T5 vertebral body was delineated and the skin area was marked. The skin was infiltrated with a 1% Lidocaine approximately 3 cm lateral to the spinous process projection on the left. Using a 22-gauge spinal needle, the soft issue and the peripedicular space and periosteum were infiltrated with Bupivacaine 0.5%. A skin incision was made at the access site. Subsequently, an 11-gauge Kyphon trocar was inserted under fluoroscopic guidance until contact with the pedicle was obtained. The trocar was inserted under light hammer tapping into the pedicle until the posterior boundaries of the vertebral body was reached. The diamond mandrill was removed and 2 core biopsies wereobtained. The trocar was later removed. IMPRESSION: Successful fluoroscopy-guided left transpedicular approach for T5 vertebral body core bone biopsy . Bone samples obtained were sent for tissue exam. Electronically Signed   By: KPedro EarlsM.D.   On: 07/28/2021 12:13   DG CHEST PORT 1 VIEW  Result Date: 07/27/2021 CLINICAL DATA:  Chest pain over the last 6 weeks. EXAM: PORTABLE CHEST 1 VIEW COMPARISON:  07/22/2021 FINDINGS: Heart size is normal. The patient has developed patchy infiltrates at both lung bases consistent with bibasilar pneumonia. The upper lungs are clear. No visible effusion. No abnormal bone finding. IMPRESSION: Bilateral lower lobe pneumonia. Electronically Signed   By: MNelson ChimesM.D.   On: 07/27/2021 12:27   ECHOCARDIOGRAM COMPLETE  Result Date: 07/24/2021    ECHOCARDIOGRAM REPORT   Patient Name:   Jose HERMIZDate of Exam: 07/24/2021 Medical Rec #:  [id number]       Height:       71.0 in Accession #:    [id number]      Weight:       193.8 lb Date  of Birth:  23-Dec-1975       BSA:          2.081 m Patient Age:    45 years        BP:           139/81 mmHg Patient Gender: M               HR:           111 bpm. Exam Location:  Inpatient Procedure: 2D Echo, Color Doppler and Cardiac Doppler Indications:    Bacteremia  History:        Patient has no prior history of Echocardiogram examinations.                 Risk Factors:Diabetes and Dyslipidemia.  Sonographer:    Raquel Sarna Senior RDCS Referring Phys: [id number] Malachy Moan THOMPSON  Sonographer Comments: Chest wall very tender to the touch, exam performed with minimal probe pressure for patient comfort. Wall motion obtained from short axis. IMPRESSIONS  1. Left ventricular ejection fraction, by estimation, is >75%. The left ventricle has hyperdynamic function. The left ventricle has no regional wall motion abnormalities. Left ventricular diastolic parameters are indeterminate.  2. Right ventricular systolic function is normal. The right ventricular size is normal. There is normal pulmonary artery systolic pressure. The estimated right ventricular systolic pressure is 88.8 mmHg.  3. The mitral valve is grossly normal. No evidence of mitral valve regurgitation.  4. The aortic valve is tricuspid. Aortic valve regurgitation is not visualized.  5. The inferior vena cava is normal in size with <50% respiratory variability, suggesting right atrial pressure of 8 mmHg. Comparison(s): No prior Echocardiogram. Conclusion(s)/Recommendation(s): No evidence of valvular vegetations on this transthoracic echocardiogram, however the right-sided valves are poorly visualized. Would recommend a transesophageal echocardiogram to exclude infective endocarditis if clinically indicated. FINDINGS  Left Ventricle: Left ventricular ejection fraction, by estimation, is >75%. The left ventricle has hyperdynamic function. The left ventricle has no regional wall motion abnormalities. The left ventricular internal cavity size was small. There is no left   ventricular hypertrophy. Left ventricular diastolic parameters are indeterminate. Right Ventricle: The right ventricular size is normal. No increase in right ventricular wall thickness. Right ventricular systolic function is normal. There is normal pulmonary artery systolic pressure. The tricuspid regurgitant velocity is 2.59 m/s, and  with an assumed right atrial pressure of 8 mmHg, the estimated right ventricular systolic pressure is 91.6 mmHg. Left Atrium: Left atrial size was normal in size. Right Atrium: Right atrial size was normal in size. Pericardium: There is no evidence of pericardial effusion. Mitral Valve: The mitral valve is grossly normal. No evidence of mitral valve regurgitation. Tricuspid Valve: The tricuspid valve is grossly normal. Tricuspid valve regurgitation is trivial. Aortic Valve: The aortic valve is tricuspid. Aortic valve regurgitation is not visualized. Pulmonic Valve: The pulmonic valve was normal in structure. Pulmonic valve regurgitation is not visualized. Aorta: The aortic root and ascending aorta are structurally normal, with no evidence of dilitation. Venous: The inferior vena cava is normal in size with less than 50% respiratory variability, suggesting right atrial pressure of 8 mmHg. IAS/Shunts: No atrial level shunt detected by color flow Doppler.  LEFT VENTRICLE PLAX 2D LVIDd:         3.70  cm LVIDs:         2.15 cm LV PW:         1.15 cm LV IVS:        0.90 cm LVOT diam:     1.90 cm LV SV:         56 LV SV Index:   27 LVOT Area:     2.84 cm  RIGHT VENTRICLE RV S prime:     27.80 cm/s TAPSE (M-mode): 3.6 cm LEFT ATRIUM           Index LA diam:      3.25 cm 1.56 cm/m LA Vol (A4C): 28.3 ml 13.60 ml/m  AORTIC VALVE LVOT Vmax:   123.00 cm/s LVOT Vmean:  95.500 cm/s LVOT VTI:    0.197 m  AORTA Ao Root diam: 3.70 cm TRICUSPID VALVE TR Peak grad:   26.8 mmHg TR Vmax:        259.00 cm/s  SHUNTS Systemic VTI:  0.20 m Systemic Diam: 1.90 cm Lyman Bishop MD Electronically signed by  Lyman Bishop MD Signature Date/Time: 07/24/2021/1:53:34 PM    Final     ASSESSMENT AND PLAN: 1.  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  MSSA bacteremia 3.  Normocytic anemia 4.  Leukocytosis 5.  AKI on CKD, improved 6.  Diabetes mellitus with retinopathy, neuropathy, and microalbuminuria 7.  Hypertension 8.  Hyperlipidemia  -his SPEP came back negative for M protein, immunofixation was unremarkable.  UPEP showed significant proteinuria, elevated kappa and lambda light chain, with normal ratio.  The immunofixation was unremarkable.  The above results do not support multiple myeloma, also nonsecretory plasmacytoma is not completely ruled out. -I discussed with Dr. Grandville Silos and ID Dr. Gale Journey yesterday, and will agree with T5 bone biopsy to determine infection vs malignancy  -I will f/u with the biopsy results. Other management per primary team.   Future Appointments  Date Time Provider [place]  08/07/2021  2:45 PM Felipa Furnace, DPM [facility]  08/11/2021  8:45 AM Renato Shin, MD [facility] None      LOS: 6 days     [place] Merle  07/28/2021

## 2021-07-28 NOTE — H&P (View-Only) (Signed)
  Shared Decision Making/Informed Consent  The risks [esophageal damage, perforation (1:10,000 risk), bleeding, pharyngeal hematoma as well as other potential complications associated with conscious sedation including aspiration, arrhythmia, respiratory failure and death, benefits (treatment guidance and diagnostic support) and alternatives of a transesophageal echocardiogram were discussed in detail with Jose [name] and he is willing to proceed.    Phone consent obtained, witnessed by Ms Mancel Bale NP-BC. NPO after midnight, TEE scheduled tomorrow at 130 PM, patient will be transferred from [facility] to [facility].

## 2021-07-28 NOTE — Procedures (Signed)
INTERVENTIONAL NEURORADIOLOGY BRIEF POSTPROCEDURE NOTE  FLUOROSCOPY GUIDED T5 CORE BIOPSY  Attending: Dr. Pedro Earls  Assistant: None.  Diagnosis: Abnormal signal at the T5 vertebral body, malignancy vs osteomyelitis.  Access site: Percutaneous, left transpedicular.  Anesthesia: Moderate sedation.  Medication used: 2 mg Versed IV; 100 mcg Fentanyl IV.  Complications: None.  Estimated blood loss: Negligible.  Specimen: 2 core biopsy samples sent for tissue exam.  Findings: Mild T5 superior endplate fracture. Core biopsy performed x2.  The patient tolerated the procedure well without incident or complication and is in stable condition.

## 2021-07-28 NOTE — Sedation Documentation (Signed)
[facility] at bedside to transport pt to [facility]. Pt awake alert and oriented. Vital signs stable. No s/s of distress at this time. Handoff completed

## 2021-07-28 NOTE — Progress Notes (Signed)
PT Cancellation Note  Patient Details Name: Jose [name] MRN: [id number] DOB: 11/27/1975   Cancelled Treatment:    Reason Eval/Treat Not Completed: Pain limiting ability to participate. Will check back another day-likely Thursday since pt is schedule to have a TEE on tomorrow at [facility].    [facility]  Office: [phone] Pager: [phone]

## 2021-07-28 NOTE — Progress Notes (Signed)
Daily Progress Note   Patient Name: Jose [name]       Date: 07/28/2021 DOB: 19-May-1976  Age: 45 y.o. MRN#: [id number] Attending Physician: Eugenie Filler, MD Primary Care Physician: [name], NP Admit Date: 07/22/2021  Reason for Consultation/Follow-up: Non pain symptom management and Pain control  Subjective: I saw and examined Jose [name] today after he had gotten back from [facility] where he had thoracic biopsy performed today.  He was lying in bed and sleeping.  He was arousable but seemed a little groggy.  Likely from medications given during procedure or pain medication administered afterward.  He tells me that he is still having constant pain and we discussed increasing his long-acting medication.  Length of Stay: 6  Current Medications: Scheduled Meds:  . amLODipine  10 mg Oral Daily  . atorvastatin  40 mg Oral Daily  . collagenase   Topical Daily  . cyanocobalamin  1,000 mcg Subcutaneous Daily  . cyclobenzaprine  10 mg Oral BID  . feeding supplement  237 mL Oral BID BM  . fentaNYL  1 patch Transdermal Q72H  . furosemide  40 mg Intravenous Q8H  . gabapentin  200 mg Oral TID  . heparin  5,000 Units Subcutaneous Q8H  . hydrALAZINE  50 mg Oral Q8H  . insulin aspart  0-9 Units Subcutaneous Q6H  . lactulose  20 g Oral BID  . multivitamin with minerals  1 tablet Oral Daily  . mupirocin ointment  1 application Topical BID  . senna-docusate  1 tablet Oral BID  . traZODone  100 mg Oral QHS  . verapamil  120 mg Oral Daily    Continuous Infusions: .  ceFAZolin (ANCEF) IV 2 g (07/28/21 1317)  . chlorproMAZINE (THORAZINE) IV 25 mg (07/26/21 0854)    PRN Meds: acetaminophen, albuterol, ALPRAZolam, chlorproMAZINE (THORAZINE) IV, fentaNYL, hydrALAZINE, HYDROmorphone  (DILAUDID) injection, midazolam, ondansetron, oxyCODONE  Physical Exam         Resting in bed Regular work of breathing No distress Sleepy  Abdomen not distended No edema Has pain and swelling, some warmth to R posterior flank area.   Vital Signs: BP (!) 166/92   Pulse (!) 112   Temp 98.2 F (36.8 C) (Oral)   Resp 19   Ht _0  (1.803 m)   Wt 85.7 kg  SpO2 (!) 88%   BMI 26.36 kg/m  SpO2: SpO2: (!) 88 % O2 Device: O2 Device: Nasal Cannula O2 Flow Rate: O2 Flow Rate (L/min): 2 L/min  Intake/output summary:  Intake/Output Summary (Last 24 hours) at 07/28/2021 1832 Last data filed at 07/28/2021 1421 Gross per 24 hour  Intake 660 ml  Output --  Net 660 ml    LBM: Last BM Date: 07/27/21 Baseline Weight: Weight: 87.9 kg Most recent weight: Weight: 85.7 kg       Palliative Assessment/Data:      Patient Active Problem List   Diagnosis Date Noted  . Diabetic ulcer of left great toe ([place])   . MSSA bacteremia 07/23/2021  . Constipation 07/23/2021  . Lytic bone lesions on xray 07/22/2021  . AKI (acute kidney injury) ([place]) 05/19/2021  . Anemia 05/19/2021  . Nausea and vomiting 05/19/2021  . Elevated blood pressure reading 05/19/2021  . COVID-19 05/18/2021  . Bilateral carpal tunnel syndrome 01/23/2020  . Numbness 12/12/2019  . Microalbuminuria due to type 2 diabetes mellitus ([place]) 07/20/2019  . GAD (generalized anxiety disorder) 06/18/2019  . Adult ADHD 06/18/2019  . Trigger middle finger of left hand 06/11/2019  . Diabetic retinopathy ([place]) 07/13/2018  . Type 2 diabetes mellitus with diabetic polyneuropathy, without long-term current use of insulin ([place]) 05/17/2018  . Tobacco use disorder 05/17/2018  . Hyperlipidemia 05/17/2018    Palliative Care Assessment & Plan   Patient Profile:    Assessment:  45 y.o. male  with past medical history of type 2 diabetes with retinopathy, neuropathy, microalbuminemia, history of hypertension dyslipidemia stage III  chronic kidney disease, COVID-19 infection in July 2022 admitted on 07/22/2021 with chest pain radiating to bilateral shoulder blades for 6 weeks.    Patient presented to the emergency department and was admitted to hospital medicine service.  Patient was found to have CT chest with lytic bone lesions involving T5 and T6 as well as the sternum.  There was concern for pathologic fracture of T5.  Multiple myeloma is in the differential diagnosis.  Found to have MSSA bacteremia. Patient is being followed by medical oncology and infectious disease.  He was also seen by podiatry because of left hallux ulcer.  Patient was seen by interventional radiology and is to underwent bone marrow biopsy on 07-28-2021.  He continues on antibiotics. Palliative consultation for pain management has been requested.  Recommendations/Plan: Pain:I reviewed his usage over the last 24 hours and he has had a total of 4 mg of IV Dilaudid as well as 5 mg of oxycodone for a total of 87.5 mg of oral morphine equivalent.  He additionally did have some fentanyl associated with procedure, but I did not include this in calculation.  He is wearing a 12 mcg fentanyl patch.  While I think we may need to increase it to a higher dose, will only plan to double at this point to 25 mcg as he is sleepy at time of my encounter and is my first time meeting him.  Monitor overall condition, hospital course and disease trajectory of illness.  Appreciate multi disciplinary team approach and support.     Goals of Care and Additional Recommendations: Limitations on Scope of Treatment: Full Scope Treatment  Code Status:    Code Status Orders  (From admission, onward)           Start     Ordered   07/22/21 1845  Full code  Continuous        07/22/21 1844  Code Status History     Date Active Date Inactive Code Status Order ID Comments User Context   05/19/2021 0418 05/21/2021 1847 Full Code [id number]  Tacey Ruiz, MD  Inpatient       Prognosis:  Unable to determine  Discharge Planning: To Be Determined  Care plan was discussed with patient and wife. Also discussing with other team members. Seen with RN in the room.   Thank you for allowing the Palliative Medicine Team to assist in the care of this patient.   Total Time 35 Prolonged Time Billed  no   Greater than 50%  of this time was spent counseling and coordinating care related to the above assessment and plan.   Micheline Rough, MD  Please contact Palliative Medicine Team phone at [phone] for questions and concerns.

## 2021-07-28 NOTE — Progress Notes (Signed)
PROGRESS NOTE    Jose [name]  WPV:[id number] DOB: 17-Sep-1976 DOA: 07/22/2021 PCP: [name], NP    Chief Complaint  Patient presents with   Chest Pain    Brief Narrative:  Patient 45 year old gentleman history of type 2 diabetes with retinopathy, neuropathy, microalbuminemia, hypertension, hyperlipidemia, chronic kidney disease stage III (baseline 1.1-1.2), COVID-19 infection July 2022 presented to the ED with complaints of chest pain radiating to bilateral shoulder blades x6 weeks.  Seen by PCP x-ray done showed abnormal curvature of the spine MRI ordered which was scheduled for October however due to worsening symptoms presented to the ED.  Patient noted to be afebrile.  Systolic blood pressures in the 160s.  Patient with a leukocytosis, hemoglobin at 7.5, D-dimer slightly elevated with VQ scan negative for PE.  CT chest without contrast with lytic bone lesions involving T5 and T6 and the sternum, concern for pathologic fracture of T6 with paraspinal hematoma or tumor could represent metastatic bone disease or possibly myeloma.  Patient pancultured with blood cultures 3/4 bottles positive for MSSA.  Case discussed with oncology who will formally consulted and recommended a multiple myeloma panel and urine light chains and LDH level which were ordered.  Patient placed on gentle hydration.  Patient placed on pain management.  Patient admitted for further evaluation and work-up.  Patient being followed by ID due to MSSA bacteremia and hematology/oncology.  Patient for lytic lesion biopsy/pathology 07/28/2021 per IR.   Assessment & Plan:   Principal Problem:   Lytic bone lesions on xray Active Problems:   Type 2 diabetes mellitus with diabetic polyneuropathy, without long-term current use of insulin (HCC)   Hyperlipidemia   GAD (generalized anxiety disorder)   AKI (acute kidney injury) ([place])   Anemia   MSSA bacteremia   Constipation   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)  1  lytic bone lesion/disease-highly suspicious for malignancy versus myeloma versus osteomyelitis -Patient presented with 6-week history of chest pain.  Patient with recently diagnosed chronic kidney disease in March 2022 per patient. -CT chest with lytic bone disease involving T5 and T6 and possibly the sternum, pathologic fracture of T6 with paraspinal hematoma or tumor, findings suggestive of metastatic bone disease or possibly myeloma. -Radiology recommended MRI of the T and L-spine with and without contrast which was ordered after discussions with Dr. Burr Medico, oncology. -CT abdomen and pelvis also ordered to follow-up on any further lesions. -Multiple myeloma panel, urine light chains and LDH level ordered. -Oncology has assessed patient, following and initially felt patient may need bone marrow biopsy however that has been canceled as they feel lesions may be infectious related.  Patient with no hypercalcemia.  -IR consultation and evaluation for possible kyphoplasty which at this time likely on hold until bacteremia is treated.. -Patient being followed by oncology and per their note states given limited bone involvement and T4/5 without other tissue bone marrow signal on the scan multiple myeloma less likely.  Oncology suspecting thoracic spine bone lesion could likely be to osteomyelitis. -Per oncology slight elevation in both kappa and lambda chains and not supporting diagnosis of multiple myeloma at this time. -SPEP UPEP ordered per hematology/oncology. -Continue empiric IV Ancef as recommended per ID. -Patient seen in consultation per palliative care and patient's long-acting OxyContin has been discontinued and patient placed on transdermal fentanyl patch due to patient's stage III chronic kidney disease and slowly rising serum creatinine.  IV Dilaudid changed to 1 mg every 3 hours as needed. -Palliative care to  continue low-dose Xanax, Flexeril, Tylenol, Neurontin. -IR consult placed per ID for  vertebral bone biopsy/pathology for further evaluation of lytic bone lesions which will be done today at [facility] 07/28/2021. -Oncology and palliative care following and appreciate input and recommendations.  2.  MSSA bacteremia -Patient with MSSA bacteremia noted on blood cultures.   -Patient with a left great toe wound with pus noted and necrotic area likely source of bacteremia however podiatry doubts this is the source of bacteremia..   -2D echo done negative for vegetations.   -Patient seen in consultation by ID who are recommending repeat blood cultures, need for TEE (cardiology notified for need for TEE initially felt will not be done on Wednesday however possibility of it being done tomorrow is high and as such patient will be made n.p.o. after midnight in anticipation of possible TEE which was to be done today however unable to be done per cardiology and likely will be done on 07/30/2021 and as such patient placed back on a diet.  -Continue antibiotics of IV Ancef.   -ID following and appreciate input and recommendations.   3.  AKI on chronic kidney disease stage IIIa -Baseline creatinine 1.3 from July 2022. -Creatinine elevated on admission at 1.99. -Renal function fluctuating currently at 1.93 this morning from 2.09 yesterday after receiving IV Lasix.   -Continue to hold ARB.  -Patient to receive a dose of IV Lasix today due to concern for volume overload. -Follow labs in the a.m.  4.  Diabetes mellitus type 2 -Hemoglobin A1c of 7.29 May 2021. -Patient with long-term complication of retinopathy, neuropathy, microalbuminemia. -CBG 137 this morning. -Continue to hold home oral hypoglycemic agents.   -SSI.  - -Neurontin.     5.  Hypertension Patient with significantly elevated blood pressure likely pain component on admission. -Continue to hold ARB.  Lasix initially held on admission due to AKI.  -Continue verapamil, hydralazine to 50 mg every 8 hours. -Increase Norvasc to 10  mg daily. -IV hydralazine as needed systolic blood pressure > 180. -We will give Lasix 40 mg IV every 8 hours x2 doses today.   -Continue pain management.   6.  Hyperlipidemia -Statin.  7.  Acute on chronic anemia/low vitamin B12 levels -Hemoglobin noted at baseline to be approximately 9 in July. -Patient presented he had a hemoglobin as low as 7.5 likely secondary to chronic kidney disease versus malignancy. -Patient with no overt bleeding. -Hemoglobin went as low as 7.1 on 07/25/2021 patient transfused 1 unit packed red blood cells.   -Hemoglobin currently at 8.4 today.  -May need IV iron during this hospitalization however will defer to oncology. -Vitamin B12 levels at 252 and as such patient placed on vitamin B12 supplementation of subcutaneous during the hospitalization and transition to oral vitamin B12 on discharge.  - follow H&H.  8.  Constipation -Patient with hard stools noted to be small over the past 2 days.   -Continue lactulose twice daily, Senokot-S twice daily, Dulcolax suppositories daily.   -Enema ordered on 07/26/2021..   -Continue current bowel regimen.    9.  Left foot ulcer/wound, poa -Patient seen by wound care RN who assessed wound and noted pain content pus that pulled out from 2 different areas of the toe with a necrotic area noted. -Concern this might be source for patient's MSSA however podiatry doubts if this is the source and podiatry no further immediate work-up needed for the left hallux warranted at this time which ID is in agreement with. -  Continue current wound care as recommended by podiatry.  10 probable volume overload -Patient noted to have some complaints of dizziness, some shortness of breath on 07/27/2021.. -Bibasilar crackles noted on examination. -Chest x-ray done with concerns for volume overload.   -Patient received Lasix 40 mg IV x2 doses yesterday with urine output that was recorded of 750 cc.   -Labs pending for this morning.  -We will  give Lasix 40 mg IV every 8 hours x2 doses. -Strict I's and O's.  Daily weights.    DVT prophylaxis: Continue Heparin Code Status: Full Family Communication: Updated patient.  No family at bedside.   Disposition:   Status is: Inpatient  Remains inpatient appropriate because:Inpatient level of care appropriate due to severity of illness  Dispo: The patient is from: Home              Anticipated d/c is to: Home              Patient currently is not medically stable to d/c.   Difficult to place patient No       Consultants:  Oncology: Dr. Burr Medico 07/23/2021 ID: Dr. Gale Journey 07/24/2021 Podiatry : Dr. Sherryle Lis 07/24/2021 Palliative care: Dr. Rowe Pavy 07/25/2021 Interventional radiology  Procedures:  CT chest 07/22/2021 Chest x-ray 07/22/2021 VQ scan 07/22/2021 2D echo 07/24/2021 Transfusion 1 units packed red blood cells 07/25/2021 Thoracic bone lesion biopsy per IR pending 07/28/2021   Antimicrobials: IV Ancef 07/23/2021>>>>>>   Subjective: Laying in bed with some complaints of chest and back pain.  Just received some IV pain medication per patient recently.  No significant shortness of breath.  No abdominal pain.     Objective: Vitals:   07/27/21 1804 07/27/21 2203 07/28/21 0213 07/28/21 0652  BP: (!) 145/87 (!) 145/94 (!) 142/83 (!) 172/90  Pulse: (!) 109 (!) 105 (!) 103 (!) 106  Resp: 19 18 16 16   Temp: 97.7 F (36.5 C) 98.5 F (36.9 C) 98.7 F (37.1 C) 98.6 F (37 C)  TempSrc: Oral Oral Oral Oral  SpO2: 90% 95% 94% 92%  Weight:      Height:        Intake/Output Summary (Last 24 hours) at 07/28/2021 0750 Last data filed at 07/28/2021 0346 Gross per 24 hour  Intake 660 ml  Output 750 ml  Net -90 ml    Filed Weights   07/25/21 0528 07/26/21 0526 07/27/21 0622  Weight: 87.6 kg 84.8 kg 85.7 kg    Examination:  General exam: : NAD. Respiratory system: Bibasilar crackles noted.  No wheezing.  Fair air movement.  Speaking in full sentences. Cardiovascular system:  Tachycardia.  No murmurs rubs or gallops.  No JVD.  No lower extremity edema.   Gastrointestinal system: Abdomen is soft, nontender, nondistended, positive bowel sounds.  No rebound.  No guarding.  Central nervous system: Alert and oriented. No focal neurological deficits. Extremities: Symmetric 5 x 5 power. Skin: No rashes, lesions or ulcers Psychiatry: Judgement and insight appear normal. Mood & affect appropriate.  Data Reviewed: I have personally reviewed following labs and imaging studies  CBC: Recent Labs  Lab 07/24/21 0550 07/24/21 1423 07/25/21 0611 07/25/21 1824 07/26/21 0735 07/27/21 0502 07/28/21 0520  WBC 13.0*  --  12.9*  --  13.7* 12.8* 12.5*  NEUTROABS 9.2*  --  9.4*  --  10.4* 9.6* 9.3*  HGB 7.2*   < > 7.1* 8.6* 8.1* 7.8* 8.4*  HCT 22.1*   < > 21.5* 26.0* 24.1* 23.5* 25.2*  MCV 84.4  --  85.0  --  84.9 84.8 84.0  PLT 332  --  321  --  310 291 358   < > = values in this interval not displayed.     Basic Metabolic Panel: Recent Labs  Lab 07/23/21 0508 07/24/21 0550 07/25/21 0611 07/26/21 0735 07/27/21 0502  NA 135 131* 135 137 137  K 4.3 4.3 4.5 4.4 4.7  CL 103 101 103 103 104  CO2 25 24 22 24  20*  GLUCOSE 144* 133* 165* 222* 158*  BUN 22* 26* 26* 25* 27*  CREATININE 1.38* 2.15* 2.21* 2.02* 2.09*  CALCIUM 9.2 8.4* 8.9 9.3 9.1  MG  --  1.8  --   --   --   PHOS  --   --   --  4.0  --      GFR: Estimated Creatinine Clearance: 46.5 mL/min (A) (by C-G formula based on SCr of 2.09 mg/dL (H)).  Liver Function Tests: Recent Labs  Lab 07/26/21 0735  ALBUMIN 2.4*     CBG: Recent Labs  Lab 07/26/21 1732 07/26/21 2204 07/27/21 0818 07/27/21 1125 07/27/21 1626  GLUCAP 200* 154* 137* 136* 157*      Recent Results (from the past 240 hour(s))  Culture, blood (routine x 2)     Status: Abnormal   Collection Time: 07/22/21  2:11 PM   Specimen: BLOOD  Result Value Ref Range Status   Specimen Description   Final    BLOOD RIGHT  ANTECUBITAL Performed at [facility], [place] [address], [place], [place] [address]    Special Requests   Final    BOTTLES DRAWN AEROBIC AND ANAEROBIC Blood Culture results may not be optimal due to an excessive volume of blood received in culture bottles Performed at [place] [address], [place], [place] [address]    Culture  Setup Time   Final    GRAM POSITIVE COCCI IN CLUSTERS IN BOTH AEROBIC AND ANAEROBIC BOTTLES CRITICAL RESULT CALLED TO, READ BACK BY AND VERIFIED WITH: PHARMD M.SWAYME AT 9179 ON 07/23/2021 BY T.SAAD. Performed at [facility], [place] [address] [place], [place] [address]    Culture STAPHYLOCOCCUS AUREUS (A)  Final   Report Status 07/25/2021 FINAL  Final   Organism ID, Bacteria STAPHYLOCOCCUS AUREUS  Final      Susceptibility   Staphylococcus aureus - MIC*    CIPROFLOXACIN <=0.5 SENSITIVE Sensitive     ERYTHROMYCIN RESISTANT Resistant     GENTAMICIN <=0.5 SENSITIVE Sensitive     OXACILLIN <=0.25 SENSITIVE Sensitive     TETRACYCLINE <=1 SENSITIVE Sensitive     VANCOMYCIN 1 SENSITIVE Sensitive     TRIMETH/SULFA <=10 SENSITIVE Sensitive     CLINDAMYCIN RESISTANT Resistant     RIFAMPIN <=0.5 SENSITIVE Sensitive     Inducible Clindamycin POSITIVE Resistant     * STAPHYLOCOCCUS AUREUS  Blood Culture ID Panel (Reflexed)     Status: Abnormal   Collection Time: 07/22/21  2:11 PM  Result Value Ref Range Status   Enterococcus faecalis NOT DETECTED NOT DETECTED Final   Enterococcus Faecium NOT DETECTED NOT DETECTED Final   Listeria monocytogenes NOT DETECTED NOT DETECTED Final   Staphylococcus species DETECTED (A) NOT DETECTED Final    Comment: CRITICAL RESULT CALLED TO, READ BACK BY AND VERIFIED WITH: PHARMD M.SWAYME AT 1547 ON 07/23/2021 BY T.SAAD.    Staphylococcus aureus (BCID) DETECTED (A) NOT DETECTED Final    Comment: CRITICAL RESULT CALLED TO, READ BACK BY AND VERIFIED WITH:  PHARMD M.SWAYME AT 5681 ON  07/23/2021 BY T.SAAD.    Staphylococcus epidermidis NOT DETECTED NOT DETECTED Final   Staphylococcus lugdunensis NOT DETECTED NOT DETECTED Final   Streptococcus species NOT DETECTED NOT DETECTED Final   Streptococcus agalactiae NOT DETECTED NOT DETECTED Final   Streptococcus pneumoniae NOT DETECTED NOT DETECTED Final   Streptococcus pyogenes NOT DETECTED NOT DETECTED Final   A.calcoaceticus-baumannii NOT DETECTED NOT DETECTED Final   Bacteroides fragilis NOT DETECTED NOT DETECTED Final   Enterobacterales NOT DETECTED NOT DETECTED Final   Enterobacter cloacae complex NOT DETECTED NOT DETECTED Final   Escherichia coli NOT DETECTED NOT DETECTED Final   Klebsiella aerogenes NOT DETECTED NOT DETECTED Final   Klebsiella oxytoca NOT DETECTED NOT DETECTED Final   Klebsiella pneumoniae NOT DETECTED NOT DETECTED Final   Proteus species NOT DETECTED NOT DETECTED Final   Salmonella species NOT DETECTED NOT DETECTED Final   Serratia marcescens NOT DETECTED NOT DETECTED Final   Haemophilus influenzae NOT DETECTED NOT DETECTED Final   Neisseria meningitidis NOT DETECTED NOT DETECTED Final   Pseudomonas aeruginosa NOT DETECTED NOT DETECTED Final   Stenotrophomonas maltophilia NOT DETECTED NOT DETECTED Final   Candida albicans NOT DETECTED NOT DETECTED Final   Candida auris NOT DETECTED NOT DETECTED Final   Candida glabrata NOT DETECTED NOT DETECTED Final   Candida krusei NOT DETECTED NOT DETECTED Final   Candida parapsilosis NOT DETECTED NOT DETECTED Final   Candida tropicalis NOT DETECTED NOT DETECTED Final   Cryptococcus neoformans/gattii NOT DETECTED NOT DETECTED Final   Meth resistant mecA/C and MREJ NOT DETECTED NOT DETECTED Final    Comment: Performed at [facility], [address], [place], [place] [address]  Culture, blood (routine x 2)     Status: None   Collection Time: 07/22/21  3:13 PM   Specimen: BLOOD  Result Value Ref Range Status   Specimen Description   Final    BLOOD  LEFT ANTECUBITAL Performed at [place] [address] [place], [place] [address]    Special Requests   Final    BOTTLES DRAWN AEROBIC ONLY Blood Culture results may not be optimal due to an inadequate volume of blood received in culture bottles Performed at [place] [address], [place], [place] [address]    Culture   Final    NO GROWTH 5 DAYS Performed at [facility], [place] [address] [place], [place] [address]    Report Status 07/27/2021 FINAL  Final  SARS CORONAVIRUS 2 (TAT 6-24 HRS) Nasopharyngeal     Status: None   Collection Time: 07/22/21  6:07 PM   Specimen: Nasopharyngeal  Result Value Ref Range Status   SARS Coronavirus 2 NEGATIVE NEGATIVE Final    Comment: (NOTE) SARS-CoV-2 target nucleic acids are NOT DETECTED.  The SARS-CoV-2 RNA is generally detectable in upper and lower respiratory specimens during the acute phase of infection. Negative results do not preclude SARS-CoV-2 infection, do not rule out co-infections with other pathogens, and should not be used as the sole basis for treatment or other patient management decisions. Negative results must be combined with clinical observations, patient history, and epidemiological information. The expected result is Negative.  Fact Sheet for Patients: SugarRoll.be  Fact Sheet for Healthcare Providers: https://www.woods-mathews.com/  This test is not yet approved or cleared by the [place] FDA and  has been authorized for detection and/or diagnosis of SARS-CoV-2 by FDA under an Emergency Use Authorization (EUA). This EUA will remain  in effect (meaning this test can be used) for the duration of the COVID-19 declaration under Se ction 564(b)(1) of the Act, 21 U.S.C. section 360bbb-3(b)(1), unless the authorization is terminated or revoked sooner.  Performed at [facility], [place] [address] [place],  [place] [address]   Culture, blood (routine x 2)     Status: None (Preliminary result)   Collection Time: 07/24/21  5:04 PM   Specimen: BLOOD  Result Value Ref Range Status   Specimen Description   Final    BLOOD RIGHT ANTECUBITAL Performed at [facility] [place] [address] [place], [place] [address]    Special Requests   Final    BOTTLES DRAWN AEROBIC AND ANAEROBIC Blood Culture adequate volume Performed at [place] [address] [place], [place] [address]    Culture   Final    NO GROWTH 4 DAYS Performed at [facility], [place] [address], [place], [place] [address]    Report Status PENDING  Incomplete  Culture, blood (routine x 2)     Status: None (Preliminary result)   Collection Time: 07/24/21  5:04 PM   Specimen: BLOOD  Result Value Ref Range Status   Specimen Description   Final    BLOOD LEFT ANTECUBITAL Performed at [place] [address], [place], [place] [address]    Special Requests   Final    BOTTLES DRAWN AEROBIC AND ANAEROBIC Blood Culture adequate volume Performed at [place] [address] [place], [place] [address]    Culture   Final    NO GROWTH 4 DAYS Performed at [facility], [place] [address] [place], [place] [address]    Report Status PENDING  Incomplete          Radiology Studies: CT HEAD WO CONTRAST (5MM)  Result Date: 07/27/2021 CLINICAL DATA:  Dizziness EXAM: CT HEAD WITHOUT CONTRAST TECHNIQUE: Contiguous axial images were obtained from the base of the skull through the vertex without intravenous contrast. COMPARISON:  None. FINDINGS: Brain: No evidence of acute infarction, hemorrhage, hydrocephalus, extra-axial collection or mass lesion/mass effect. Vascular: No hyperdense vessel or unexpected calcification. Skull: Normal. Negative for fracture or focal lesion. Sinuses/Orbits: No acute finding. Other: None. IMPRESSION: No acute intracranial abnormality  noted. Electronically Signed   By: Inez Catalina M.D.   On: 07/27/2021 20:55   DG CHEST PORT 1 VIEW  Result Date: 07/27/2021 CLINICAL DATA:  Chest pain over the last 6 weeks. EXAM: PORTABLE CHEST 1 VIEW COMPARISON:  07/22/2021 FINDINGS: Heart size is normal. The patient has developed patchy infiltrates at both lung bases consistent with bibasilar pneumonia. The upper lungs are clear. No visible effusion. No abnormal bone finding. IMPRESSION: Bilateral lower lobe pneumonia. Electronically Signed   By: Nelson Chimes M.D.   On: 07/27/2021 12:27        Scheduled Meds:  amLODipine  5 mg Oral Daily   atorvastatin  40 mg Oral Daily   collagenase   Topical Daily   cyanocobalamin  1,000 mcg Subcutaneous Daily   cyclobenzaprine  10 mg Oral BID   feeding supplement  237 mL Oral BID BM   fentaNYL  1 patch Transdermal Q72H   gabapentin  200 mg Oral TID   heparin  5,000 Units Subcutaneous Q8H   hydrALAZINE  50 mg Oral Q8H   insulin aspart  0-9 Units Subcutaneous Q6H   lactulose  20 g Oral BID   multivitamin with minerals  1 tablet Oral Daily   mupirocin ointment  1 application Topical BID   senna-docusate  1 tablet Oral BID   traZODone  100 mg Oral QHS   verapamil  120 mg Oral Daily   Continuous Infusions:   ceFAZolin (ANCEF) IV 2 g (07/28/21 5830)   chlorproMAZINE (THORAZINE) IV 25 mg (07/26/21 0854)     LOS: 6 days    Time spent: 35 minutes    Irine Seal, MD [facility]   To contact the attending provider between 7A-7P or the covering provider during after hours 7P-7A, please log into the web site [email or web address] and access using universal [place] password for that web site. If you do not have the password, please call the hospital operator.  07/28/2021, 7:50 AM

## 2021-07-29 ENCOUNTER — Encounter (HOSPITAL_COMMUNITY)
Admission: EM | Disposition: A | Discharge status: Home Health | Payer: Self-pay | Source: Home / Self Care | Attending: Internal Medicine

## 2021-07-29 ENCOUNTER — Inpatient Hospital Stay (HOSPITAL_COMMUNITY): Payer: 59

## 2021-07-29 ENCOUNTER — Inpatient Hospital Stay (HOSPITAL_COMMUNITY): Payer: 59 | Admitting: Certified Registered Nurse Anesthetist

## 2021-07-29 ENCOUNTER — Encounter (HOSPITAL_COMMUNITY): Payer: Self-pay | Admitting: Internal Medicine

## 2021-07-29 DIAGNOSIS — K5900 Constipation, unspecified: Secondary | ICD-10-CM | POA: Diagnosis not present

## 2021-07-29 DIAGNOSIS — Q2112 Patent foramen ovale: Secondary | ICD-10-CM

## 2021-07-29 DIAGNOSIS — R7881 Bacteremia: Secondary | ICD-10-CM

## 2021-07-29 DIAGNOSIS — M899 Disorder of bone, unspecified: Secondary | ICD-10-CM | POA: Diagnosis not present

## 2021-07-29 DIAGNOSIS — N179 Acute kidney failure, unspecified: Secondary | ICD-10-CM | POA: Diagnosis not present

## 2021-07-29 DIAGNOSIS — B9561 Methicillin susceptible Staphylococcus aureus infection as the cause of diseases classified elsewhere: Secondary | ICD-10-CM | POA: Diagnosis not present

## 2021-07-29 HISTORY — PX: TEE WITHOUT CARDIOVERSION: SHX5443

## 2021-07-29 HISTORY — PX: BUBBLE STUDY: SHX6837

## 2021-07-29 LAB — CULTURE, BLOOD (ROUTINE X 2)
Culture: NO GROWTH
Culture: NO GROWTH
Special Requests: ADEQUATE
Special Requests: ADEQUATE

## 2021-07-29 LAB — RENAL FUNCTION PANEL
Albumin: 2.3 g/dL — ABNORMAL LOW (ref 3.5–5.0)
Anion gap: 12 (ref 5–15)
BUN: 28 mg/dL — ABNORMAL HIGH (ref 6–20)
CO2: 23 mmol/L (ref 22–32)
Calcium: 9.3 mg/dL (ref 8.9–10.3)
Chloride: 103 mmol/L (ref 98–111)
Creatinine, Ser: 1.92 mg/dL — ABNORMAL HIGH (ref 0.61–1.24)
GFR, Estimated: 43 mL/min — ABNORMAL LOW (ref >60)
Glucose, Bld: 138 mg/dL — ABNORMAL HIGH (ref 70–99)
Phosphorus: 5.4 mg/dL — ABNORMAL HIGH (ref 2.5–4.6)
Potassium: 4.2 mmol/L (ref 3.5–5.1)
Sodium: 138 mmol/L (ref 135–145)

## 2021-07-29 LAB — GLUCOSE, CAPILLARY
Glucose-Capillary: 166 mg/dL — ABNORMAL HIGH (ref 70–99)
Glucose-Capillary: 130 mg/dL — ABNORMAL HIGH (ref 70–99)
Glucose-Capillary: 122 mg/dL — ABNORMAL HIGH (ref 70–99)
Glucose-Capillary: 153 mg/dL — ABNORMAL HIGH (ref 70–99)

## 2021-07-29 SURGERY — ECHOCARDIOGRAM, TRANSESOPHAGEAL
Anesthesia: Monitor Anesthesia Care

## 2021-07-29 MED ORDER — FENTANYL CITRATE (PF) 100 MCG/2ML IJ SOLN
INTRAMUSCULAR | Status: AC
  Filled 2021-07-29: qty 2

## 2021-07-29 MED ORDER — SODIUM CHLORIDE 0.9 % IV SOLN: INTRAVENOUS | Status: DC | PRN

## 2021-07-29 MED ORDER — PROPOFOL 10 MG/ML IV BOLUS
INTRAVENOUS | Status: DC | PRN
Start: 2021-07-29 — End: 2021-07-29
  Administered 2021-07-29: 50 mg via INTRAVENOUS

## 2021-07-29 MED ORDER — LIDOCAINE 2% (20 MG/ML) 5 ML SYRINGE
INTRAMUSCULAR | Status: DC | PRN
  Administered 2021-07-29: 100 mg via INTRAVENOUS

## 2021-07-29 MED ORDER — PROPOFOL 500 MG/50ML IV EMUL
INTRAVENOUS | Status: DC | PRN
  Administered 2021-07-29: 100 ug/kg/min via INTRAVENOUS

## 2021-07-29 NOTE — Progress Notes (Signed)
[facility]  Date of Admission:  07/22/2021     Lines:  Peripheral iv's   Abx: 9/29-c cefazolin                                                                Assessment: 45 yo male with dm2, peripheral neuropathy/retinopathy, ckd3 (bl cr 1.1-1.2), hx covid infection 04/2021, admitted 9/28 for chest pain radiating to his shoulders for the past 6 weeks found to have ct imaging lytic lesion t5-6/sternum concerning for MM, and also mssa bacteremia   9/28 bcx mssa 9/30 bcx negative  Tte no obvious vegetation; normal lvef  Left upper back lesion appears to be staph associated carbuncle -- ruptured and packed -- improving  He has chronic ulcer left foot with mild erythema around it could be source for mssa bsi but agreed with podiatry that no further investigation needed at this time or surgical debridement  -------- 10/05 assessment Patient chronically is on multiple psychotropic medication including opiods. He is on it more here. And has been lethargic appearing. Suspect appetite/malaise at home affected by these meds as well  Serologic MM workup so far negative Biopsy of the vertebral body pathology in process  Awaiting tee today  Anticipate final recs tomorrow    Plan: Continue cefazolin F/u tee result Will place final abx recs/id f/u tomorrow and arrange outpatient id f/u Discussed with primary team  I spent more than 35 minute reviewing data/chart, and coordinating care and >50% direct face to face time providing counseling/discussing diagnostics/treatment plan with patient   I spent 60 minute reviewing data/chart, and coordinating care and >50% direct face to face time providing counseling/discussing diagnostics/treatment plan with patient    Principal Problem:   Lytic bone lesions on xray Active Problems:   Type 2 diabetes mellitus with diabetic polyneuropathy, without long-term current use of insulin (HCC)   Hyperlipidemia    GAD (generalized anxiety disorder)   AKI (acute kidney injury) ([facility])   Anemia   MSSA bacteremia   Constipation   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)   No Known Allergies  Scheduled Meds:  [MAR Hold] amLODipine  10 mg Oral Daily   [MAR Hold] atorvastatin  40 mg Oral Daily   [MAR Hold] collagenase   Topical Daily   [MAR Hold] cyclobenzaprine  10 mg Oral BID   [MAR Hold] feeding supplement  237 mL Oral BID BM   [MAR Hold] fentaNYL  1 patch Transdermal Q72H   [MAR Hold] gabapentin  200 mg Oral TID   [MAR Hold] heparin  5,000 Units Subcutaneous Q8H   [MAR Hold] hydrALAZINE  50 mg Oral Q8H   [MAR Hold] insulin aspart  0-9 Units Subcutaneous Q6H   [MAR Hold] lactulose  20 g Oral BID   [MAR Hold] multivitamin with minerals  1 tablet Oral Daily   [MAR Hold] mupirocin ointment  1 application Topical BID   [MAR Hold] senna-docusate  1 tablet Oral BID   [MAR Hold] traZODone  100 mg Oral QHS   [MAR Hold] verapamil  120 mg Oral Daily   Continuous Infusions:  [MAR Hold]  ceFAZolin (ANCEF) IV 2 g (07/29/21 0551)   [MAR Hold] chlorproMAZINE (THORAZINE) IV 25 mg (07/26/21 0854)  PRN Meds:.[MAR Hold] acetaminophen, [MAR Hold] albuterol, [MAR Hold] ALPRAZolam, [MAR Hold] chlorproMAZINE (THORAZINE) IV, fentaNYL, [MAR Hold] hydrALAZINE, [MAR Hold]  HYDROmorphone (DILAUDID) injection, midazolam, ondansetron, [MAR Hold] oxyCODONE   SUBJECTIVE: Sleepy/lethargic Multiple iv/po opioids and sedative meds No n/v/diarrhea/rash/le strength deficit Pain sternum/mid upper back moderate  No f/c Tee cooking for today  Review of Systems: ROS All other ROS was negative, except mentioned above     OBJECTIVE: Vitals:   07/29/21 1136 07/29/21 1303 07/29/21 1310 07/29/21 1320  BP: (!) 170/91 128/64 (!) 148/68 (!) 148/71  Pulse: (!) 108 97 (!) 101 (!) 103  Resp: _0 Temp: 98.1 F (36.7 C) 97.7 F (36.5 C)    TempSrc: Temporal Temporal    SpO2: 96% 95% 95% 96%  Weight: 86.2 kg      Height: _1  (1.803 m)      Body mass index is 26.5 kg/m.  Physical Exam  General/constitutional: no distress, pleasant HEENT: Normocephalic, PER, Conj Clear, EOMI, Oropharynx clear Neck supple CV: rrr no mrg Lungs: clear to auscultation, normal respiratory effort Abd: Soft, Nontender Ext: no edema Skin: small carbuncle upper back that had ruptured improving.  GU: didn't look at his pilonidal cyst - no complaint and no issue from chart Neuro: nonfocal MSK: no peripheral joint swelling/tenderness/warmth; mid thoracic midline and mid sternal tenderness    Lab Results Lab Results  Component Value Date   WBC 12.5 (H) 07/28/2021   HGB 8.4 (L) 07/28/2021   HCT 25.2 (L) 07/28/2021   MCV 84.0 07/28/2021   PLT 358 07/28/2021    Lab Results  Component Value Date   CREATININE 1.92 (H) 07/29/2021   BUN 28 (H) 07/29/2021   NA 138 07/29/2021   K 4.2 07/29/2021   CL 103 07/29/2021   CO2 23 07/29/2021    Lab Results  Component Value Date   ALT 19 05/20/2021   AST 17 05/20/2021   ALKPHOS 69 05/20/2021   BILITOT 0.7 05/20/2021      Microbiology: Recent Results (from the past 240 hour(s))  Culture, blood (routine x 2)     Status: Abnormal   Collection Time: 07/22/21  2:11 PM   Specimen: BLOOD  Result Value Ref Range Status   Specimen Description   Final    BLOOD RIGHT ANTECUBITAL Performed at [facility], [place] [address], [place], [place] [address]    Special Requests   Final    BOTTLES DRAWN AEROBIC AND ANAEROBIC Blood Culture results may not be optimal due to an excessive volume of blood received in culture bottles Performed at [facility] [address] [place], [place] [address]    Culture  Setup Time   Final    GRAM POSITIVE COCCI IN CLUSTERS IN BOTH AEROBIC AND ANAEROBIC BOTTLES CRITICAL RESULT CALLED TO, READ BACK BY AND VERIFIED WITH: PHARMD M.SWAYME AT 0071 ON 07/23/2021 BY T.SAAD. Performed at [facility], [place] [address], [place], [place] [address]    Culture STAPHYLOCOCCUS AUREUS (A)  Final   Report Status 07/25/2021 FINAL  Final   Organism ID, Bacteria STAPHYLOCOCCUS AUREUS  Final      Susceptibility   Staphylococcus aureus - MIC*    CIPROFLOXACIN <=0.5 SENSITIVE Sensitive     ERYTHROMYCIN RESISTANT Resistant     GENTAMICIN <=0.5 SENSITIVE Sensitive     OXACILLIN <=0.25 SENSITIVE Sensitive     TETRACYCLINE <=1 SENSITIVE Sensitive     VANCOMYCIN 1 SENSITIVE Sensitive  TRIMETH/SULFA <=10 SENSITIVE Sensitive     CLINDAMYCIN RESISTANT Resistant     RIFAMPIN <=0.5 SENSITIVE Sensitive     Inducible Clindamycin POSITIVE Resistant     * STAPHYLOCOCCUS AUREUS  Blood Culture ID Panel (Reflexed)     Status: Abnormal   Collection Time: 07/22/21  2:11 PM  Result Value Ref Range Status   Enterococcus faecalis NOT DETECTED NOT DETECTED Final   Enterococcus Faecium NOT DETECTED NOT DETECTED Final   Listeria monocytogenes NOT DETECTED NOT DETECTED Final   Staphylococcus species DETECTED (A) NOT DETECTED Final    Comment: CRITICAL RESULT CALLED TO, READ BACK BY AND VERIFIED WITH: PHARMD M.SWAYME AT 1547 ON 07/23/2021 BY T.SAAD.    Staphylococcus aureus (BCID) DETECTED (A) NOT DETECTED Final    Comment: CRITICAL RESULT CALLED TO, READ BACK BY AND VERIFIED WITH: PHARMD M.SWAYME AT 1547 ON 07/23/2021 BY T.SAAD.    Staphylococcus epidermidis NOT DETECTED NOT DETECTED Final   Staphylococcus lugdunensis NOT DETECTED NOT DETECTED Final   Streptococcus species NOT DETECTED NOT DETECTED Final   Streptococcus agalactiae NOT DETECTED NOT DETECTED Final   Streptococcus pneumoniae NOT DETECTED NOT DETECTED Final   Streptococcus pyogenes NOT DETECTED NOT DETECTED Final   A.calcoaceticus-baumannii NOT DETECTED NOT DETECTED Final   Bacteroides fragilis NOT DETECTED NOT DETECTED Final   Enterobacterales NOT DETECTED NOT DETECTED Final   Enterobacter cloacae complex NOT DETECTED NOT DETECTED Final    Escherichia coli NOT DETECTED NOT DETECTED Final   Klebsiella aerogenes NOT DETECTED NOT DETECTED Final   Klebsiella oxytoca NOT DETECTED NOT DETECTED Final   Klebsiella pneumoniae NOT DETECTED NOT DETECTED Final   Proteus species NOT DETECTED NOT DETECTED Final   Salmonella species NOT DETECTED NOT DETECTED Final   Serratia marcescens NOT DETECTED NOT DETECTED Final   Haemophilus influenzae NOT DETECTED NOT DETECTED Final   Neisseria meningitidis NOT DETECTED NOT DETECTED Final   Pseudomonas aeruginosa NOT DETECTED NOT DETECTED Final   Stenotrophomonas maltophilia NOT DETECTED NOT DETECTED Final   Candida albicans NOT DETECTED NOT DETECTED Final   Candida auris NOT DETECTED NOT DETECTED Final   Candida glabrata NOT DETECTED NOT DETECTED Final   Candida krusei NOT DETECTED NOT DETECTED Final   Candida parapsilosis NOT DETECTED NOT DETECTED Final   Candida tropicalis NOT DETECTED NOT DETECTED Final   Cryptococcus neoformans/gattii NOT DETECTED NOT DETECTED Final   Meth resistant mecA/C and MREJ NOT DETECTED NOT DETECTED Final    Comment: Performed at [facility], [address] [place], [place] [address]  Culture, blood (routine x 2)     Status: None   Collection Time: 07/22/21  3:13 PM   Specimen: BLOOD  Result Value Ref Range Status   Specimen Description   Final    BLOOD LEFT ANTECUBITAL Performed at [facility] [address], [place], [place] [address]    Special Requests   Final    BOTTLES DRAWN AEROBIC ONLY Blood Culture results may not be optimal due to an inadequate volume of blood received in culture bottles Performed at [facility] [address] [place], [place] [address]    Culture   Final    NO GROWTH 5 DAYS Performed at [facility], [place] [address], [place],  [address]    Report Status 07/27/2021 FINAL  Final  SARS CORONAVIRUS 2 (TAT 6-24 HRS) Nasopharyngeal     Status: None   Collection Time: 07/22/21   6:07 PM   Specimen: Nasopharyngeal  Result  Value Ref Range Status   SARS Coronavirus 2 NEGATIVE NEGATIVE Final    Comment: (NOTE) SARS-CoV-2 target nucleic acids are NOT DETECTED.  The SARS-CoV-2 RNA is generally detectable in upper and lower respiratory specimens during the acute phase of infection. Negative results do not preclude SARS-CoV-2 infection, do not rule out co-infections with other pathogens, and should not be used as the sole basis for treatment or other patient management decisions. Negative results must be combined with clinical observations, patient history, and epidemiological information. The expected result is Negative.  Fact Sheet for Patients: SugarRoll.be  Fact Sheet for Healthcare Providers: https://www.woods-mathews.com/  This test is not yet approved or cleared by the [place] FDA and  has been authorized for detection and/or diagnosis of SARS-CoV-2 by FDA under an Emergency Use Authorization (EUA). This EUA will remain  in effect (meaning this test can be used) for the duration of the COVID-19 declaration under Se ction 564(b)(1) of the Act, 21 U.S.C. section 360bbb-3(b)(1), unless the authorization is terminated or revoked sooner.  Performed at [facility], [place] [address] [place], [place] [address]   Culture, blood (routine x 2)     Status: None   Collection Time: 07/24/21  5:04 PM   Specimen: BLOOD  Result Value Ref Range Status   Specimen Description   Final    BLOOD RIGHT ANTECUBITAL Performed at [facility] [address] [place], [place] [address]    Special Requests   Final    BOTTLES DRAWN AEROBIC AND ANAEROBIC Blood Culture adequate volume Performed at [facility] [address], [place], [place] [address]    Culture   Final    NO GROWTH 5 DAYS Performed at [facility], [place] [address], [place], [place] [address]    Report Status  07/29/2021 FINAL  Final  Culture, blood (routine x 2)     Status: None   Collection Time: 07/24/21  5:04 PM   Specimen: BLOOD  Result Value Ref Range Status   Specimen Description   Final    BLOOD LEFT ANTECUBITAL Performed at [facility] [address], [place], [place] [address]    Special Requests   Final    BOTTLES DRAWN AEROBIC AND ANAEROBIC Blood Culture adequate volume Performed at [facility] [address], [place], [place] [address]    Culture   Final    NO GROWTH 5 DAYS Performed at [facility], [place] [address], [place], [place] [address]    Report Status 07/29/2021 FINAL  Final     Serology:   Imaging: If present, new imagings (plain films, ct scans, and mri) have been personally visualized and interpreted; radiology reports have been reviewed. Decision making incorporated into the Impression / Recommendations.  9/30 tte 1. Left ventricular ejection fraction, by estimation, is >75%. The left  ventricle has hyperdynamic function. The left ventricle has no regional  wall motion abnormalities. Left ventricular diastolic parameters are  indeterminate.   2. Right ventricular systolic function is normal. The right ventricular  size is normal. There is normal pulmonary artery systolic pressure. The  estimated right ventricular systolic pressure is 38.7 mmHg.   3. The mitral valve is grossly normal. No evidence of mitral valve  regurgitation.   4. The aortic valve is tricuspid. Aortic valve regurgitation is not  visualized.   5. The inferior vena cava is normal in size with <50% respiratory  variability, suggesting right atrial pressure  of 8 mmHg.    9/29 ct abd pelv 1. No acute intra-abdominal or intrapelvic process. 2. Moderate fecal retention consistent with constipation. 3.  Aortic Atherosclerosis    9/29 mri lumbar thoracic spine 1. Abnormal bone marrow signal and contrast enhancement at the T4 and T5 levels  with mild edema of the disc space and prevertebral phlegmon/hematoma. The findings could indicate osseous metastatic disease, multiple myeloma or discitis-osteomyelitis. 2. Minimally depressed compression fracture of T5 without retropulsion. 3. No epidural abscess. 4. No spinal canal or neural foraminal stenosis   9/28 vq scan No evidence for PE   9/28 ct chest 1. 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 There is a pathologic fracture of T6 with paraspinal hematoma or tumor. This could represent metastatic bone disease or possibly myeloma. 2. No obvious primary neoplasm involving the chest or upper abdomen. PET-CT may be helpful for further evaluation and extent of the bone disease and possibly detecting a primary neoplasm. 3. Thoracic spine MRI without and with contrast may be helpful for further evaluation of the thoracic disease. I do not see any any obvious spinal canal compromise   Jabier Mutton, [facility] [phone] pager    07/29/2021, 1:25 PM

## 2021-07-29 NOTE — CV Procedure (Signed)
   Transesophageal Echocardiogram  Indications:Bacteremia  Time out performed  During this procedure the patient was administered propofol under anesthesiology supervision to achieve and maintain moderate sedation.  The patient's heart rate, blood pressure, and oxygen saturation are monitored continuously during the procedure.   Findings:  Left Ventricle: Normal EF 55%  Mitral Valve: Mild MR, No veg  Aortic Valve: Normal, no veg  Tricuspid Valve: Normal no veg  Left Atrium: Normal, no left atrial appendage thrombus  Right Atrium: Normal  Intraatrial septum: Positive PFO  Bubble Contrast Study: Positive early bubble cross over, PFO  IMPRESSION: No vegetation. + PFO as incidental finding.   Candee Furbish, MD

## 2021-07-29 NOTE — Anesthesia Postprocedure Evaluation (Signed)
Anesthesia Post Note  Patient: Jose [name]  Procedure(s) Performed: TRANSESOPHAGEAL ECHOCARDIOGRAM (TEE) BUBBLE STUDY     Patient location during evaluation: Endoscopy Anesthesia Type: MAC Level of consciousness: awake and alert Pain management: pain level controlled Vital Signs Assessment: post-procedure vital signs reviewed and stable Respiratory status: spontaneous breathing, nonlabored ventilation, respiratory function stable and patient connected to nasal cannula oxygen Cardiovascular status: stable and blood pressure returned to baseline Postop Assessment: no apparent nausea or vomiting Anesthetic complications: no   No notable events documented.  Last Vitals:  Vitals:   07/29/21 1330 07/29/21 1340  BP: (!) 151/72 (!) 157/75  Pulse: (!) 104 (!) 105  Resp: 16 14  Temp:    SpO2: 94% 94%    Last Pain:  Vitals:   07/29/21 1340  TempSrc:   PainSc: Asleep                 Catalina Gravel

## 2021-07-29 NOTE — Transfer of Care (Signed)
Immediate Anesthesia Transfer of Care Note  Patient: GLENWOOD REVOIR  Procedure(s) Performed: TRANSESOPHAGEAL ECHOCARDIOGRAM (TEE) BUBBLE STUDY  Patient Location: Endoscopy Unit  Anesthesia Type:MAC  Level of Consciousness: drowsy  Airway & Oxygen Therapy: Patient Spontanous Breathing and Patient connected to nasal cannula oxygen  Post-op Assessment: Report given to RN and Post -op Vital signs reviewed and stable  Post vital signs: Reviewed and stable  Last Vitals:  Vitals Value Taken Time  BP 128/64 07/29/21 1303  Temp 36.5 C 07/29/21 1303  Pulse 101 07/29/21 1309  Resp 16 07/29/21 1309  SpO2 95 % 07/29/21 1309  Vitals shown include unvalidated device data.  Last Pain:  Vitals:   07/29/21 1303  TempSrc: Temporal  PainSc: Asleep      Patients Stated Pain Goal: 2 (36/14/43 1540)  Complications: No notable events documented.

## 2021-07-29 NOTE — Interval H&P Note (Signed)
History and Physical Interval Note:  07/29/2021 11:44 AM  Jose [name]  has presented today for surgery, with the diagnosis of BACTEREMIA.  The various methods of treatment have been discussed with the patient and family. After consideration of risks, benefits and other options for treatment, the patient has consented to  Procedure(s): TRANSESOPHAGEAL ECHOCARDIOGRAM (TEE) (N/A) as a surgical intervention.  The patient's history has been reviewed, patient examined, no change in status, stable for surgery.  I have reviewed the patient's chart and labs.  Questions were answered to the patient's satisfaction.     [employer]

## 2021-07-29 NOTE — Progress Notes (Signed)
Palliative care brief note  Mr. Jose [name] was off the floor to [facility] for TEE when I attempted to see him today.  Plan for f/u tomorrow.  Please call if palliative specific needs in the interim.  Micheline Rough, MD [facility] Team [phone]  NO CHARGE NOTE

## 2021-07-29 NOTE — Progress Notes (Signed)
PROGRESS NOTE  Jose [name] VXB:[id number] DOB: July 29, 1976 DOA: 07/22/2021 PCP: [name], NP  HPI/Recap of past 19 hours: 45 year old male with past medical history of poorly controlled type 2 diabetes mellitus with secondary complications including stage IIIa chronic kidney disease, retinopathy and neuropathy as well as recent COVID-19 infection in July who presented to the emergency room on 9/28 with complaints of chest pain radiating to bilateral shoulder blades for the previous 6 weeks.  Patient noted to have leukocytosis but afebrile and hemoglobin at 7.5.  D-dimer slightly elevated but VQ scan negative for PE.  CT of chest done without contrast noted lytic bone lesions concerning for pathologic fracture of T6 possibly representing metastatic bone disease versus myeloma.    Oncology consulted and given limited bone involvement and T4/5 without other bone marrow signals on scan, multiple myeloma felt to be less likely.  In addition, patient's blood cultures were 3 out of 4 positive for MSSA.  It was felt that patient's bone findings may not be related to malignancy and possibly due to osteomyelitis.  Patient underwent T5 bone biopsy on 10/4, results of which are still pending.  Patient seen by cardiology and underwent TEE on 10/5 which noted a positive PFO as an incidental finding, but no evidence of endocarditis.  Post TEE patient complains of some soreness  Assessment/Plan: Principal Problem:   Lytic bone lesions on xray: Concerning for possible malignancy although work-up to date has been unrevealing.  Multiple myeloma ruled out.  Appreciate oncology help.  Awaiting for bone biopsy results.  Possibly may be osteomyelitis.  TEE reassuring no evidence of endocarditis.  Appreciate infectious disease and cardiology help.  Being followed by palliative care for pain control recommendations Active Problems:   Type 2 diabetes mellitus with diabetic polyneuropathy, without long-term current  use of insulin (HCC)   Hyperlipidemia: Continue statin.    GAD (generalized anxiety disorder)    AKI (acute kidney injury) ([place]) in the setting of stage IIIa chronic kidney disease: Holding ARB.  Give 1 dose of IV Lasix due to concern for volume overload.  Creatinine at 1.9.  Continue to follow.    Anemia: Likely of chronic disease.    MSSA bacteremia: As above.  Currently on IV Ancef.  Waiting for follow-up recommendations from infectious disease   Constipation: On large number of pain medications.  On bowel regimen.  Diabetes mellitus type 2 with poor control and multiple complications including retinopathy, nephropathy, neuropathy and diabetic ulcer of left great toe [facility]): Holding oral agents.  Continue sliding scale and Neurontin.  Podiatry recommended wound care but does not feel that this is source of MSSA.  Hypertension: Slightly elevated blood pressure on admission due to pain.  Holding ARB continue other antihypertensives.   Code Status: Full code  Family Communication: Left message for wife  Disposition Plan: Discharge once fully diuresed, formal diagnosis made for lytic lesion because an antibiotic and pain regimen set.   Consultants: Infectious disease Oncology Interventional radiology Cardiology Podiatry Palliative care  Procedures: Status post TEE done 10/5 Status post T5 bone biopsy done 10/4 2D echocardiogram 9/30 Transfusion 1 unit packed red blood cells 10/1  Antimicrobials: IV Ancef 9/29-present  DVT prophylaxis: Heparin drip  Level of care: Med-Surg   Objective: Vitals:   07/29/21 1340 07/29/21 1526  BP: (!) 157/75 (!) 152/81  Pulse: (!) 105 (!) 110  Resp: 14 16  Temp:  98.2 F (36.8 C)  SpO2: 94% 93%    Intake/Output Summary (Last 24  hours) at 07/29/2021 1758 Last data filed at 07/29/2021 1300 Gross per 24 hour  Intake 420 ml  Output --  Net 420 ml   Filed Weights   07/27/21 0622 07/29/21 0500 07/29/21 1136  Weight: 85.7 kg 85.9 kg  86.2 kg   Body mass index is 26.5 kg/m.  Exam:  General: Alert and oriented x3, no acute distress HEENT: Normocephalic and atraumatic, mucous membranes are moist Cardiovascular: Regular rate and rhythm, S1-S2 Respiratory: Good auscultation bilaterally Abdomen: Soft, nontender, nondistended, positive bowel sounds Musculoskeletal: No clubbing or cyanosis or edema Skin: Right great toe ulceration otherwise, no skin breaks, tears or lesions Psychiatry: Appropriate, no evidence of psychoses Neurology: Chronic peripheral neuropathy from diabetes, otherwise no focal deficits   Data Reviewed: CBC: Recent Labs  Lab 07/24/21 0550 07/24/21 1423 07/25/21 0611 07/25/21 1824 07/26/21 0735 07/27/21 0502 07/28/21 0520  WBC 13.0*  --  12.9*  --  13.7* 12.8* 12.5*  NEUTROABS 9.2*  --  9.4*  --  10.4* 9.6* 9.3*  HGB 7.2*   < > 7.1* 8.6* 8.1* 7.8* 8.4*  HCT 22.1*   < > 21.5* 26.0* 24.1* 23.5* 25.2*  MCV 84.4  --  85.0  --  84.9 84.8 84.0  PLT 332  --  321  --  310 291 358   < > = values in this interval not displayed.   Basic Metabolic Panel: Recent Labs  Lab 07/24/21 0550 07/25/21 0611 07/26/21 0735 07/27/21 0502 07/28/21 0520 07/29/21 0517  NA 131* 135 137 137 135 138  K 4.3 4.5 4.4 4.7 4.0 4.2  CL 101 103 103 104 103 103  CO2 _0 20* 23 23  GLUCOSE 133* 165* 222* 158* 134* 138*  BUN 26* 26* 25* 27* 26* 28*  CREATININE 2.15* 2.21* 2.02* 2.09* 1.93* 1.92*  CALCIUM 8.4* 8.9 9.3 9.1 9.0 9.3  MG 1.8  --   --   --   --   --   PHOS  --   --  4.0  --   --  5.4*   GFR: Estimated Creatinine Clearance: 50.7 mL/min (A) (by C-G formula based on SCr of 1.92 mg/dL (H)). Liver Function Tests: Recent Labs  Lab 07/26/21 0735 07/29/21 0517  ALBUMIN 2.4* 2.3*   No results for input(s): LIPASE, AMYLASE in the last 168 hours. No results for input(s): AMMONIA in the last 168 hours. Coagulation Profile: Recent Labs  Lab 07/27/21 1749  INR 1.2   Cardiac Enzymes: No results for  input(s): CKTOTAL, CKMB, CKMBINDEX, TROPONINI in the last 168 hours. BNP (last 3 results) No results for input(s): PROBNP in the last 8760 hours. HbA1C: No results for input(s): HGBA1C in the last 72 hours. CBG: Recent Labs  Lab 07/28/21 1730 07/28/21 2054 07/29/21 0109 07/29/21 0941 07/29/21 1440  GLUCAP 155* 180* 166* 130* 122*   Lipid Profile: No results for input(s): CHOL, HDL, LDLCALC, TRIG, CHOLHDL, LDLDIRECT in the last 72 hours. Thyroid Function Tests: No results for input(s): TSH, T4TOTAL, FREET4, T3FREE, THYROIDAB in the last 72 hours. Anemia Panel: No results for input(s): VITAMINB12, FOLATE, FERRITIN, TIBC, IRON, RETICCTPCT in the last 72 hours. Urine analysis:    Component Value Date/Time   COLORURINE STRAW (A) 07/22/2021 1807   APPEARANCEUR CLEAR 07/22/2021 1807   APPEARANCEUR Clear 05/05/2018 1211   LABSPEC 1.015 07/22/2021 1807   PHURINE 6.0 07/22/2021 1807   GLUCOSEU NEGATIVE 07/22/2021 1807   HGBUR TRACE (A) 07/22/2021 1807   BILIRUBINUR NEGATIVE 07/22/2021 1807  BILIRUBINUR negative 09/12/2018 0919   BILIRUBINUR Negative 05/05/2018 1211   KETONESUR NEGATIVE 07/22/2021 1807   PROTEINUR 100 (A) 07/22/2021 1807   UROBILINOGEN 1.0 09/12/2018 0919   NITRITE NEGATIVE 07/22/2021 1807   LEUKOCYTESUR NEGATIVE 07/22/2021 1807   Sepsis Labs: _0 (procalcitonin:4,lacticidven:4)  ) Recent Results (from the past 240 hour(s))  Culture, blood (routine x 2)     Status: Abnormal   Collection Time: 07/22/21  2:11 PM   Specimen: BLOOD  Result Value Ref Range Status   Specimen Description   Final    BLOOD RIGHT ANTECUBITAL Performed at [facility], [place] [address] [place], [place] [address]    Special Requests   Final    BOTTLES DRAWN AEROBIC AND ANAEROBIC Blood Culture results may not be optimal due to an excessive volume of blood received in culture bottles Performed at [place] [address] [place],  [place] [address]    Culture  Setup Time   Final    GRAM POSITIVE COCCI IN CLUSTERS IN BOTH AEROBIC AND ANAEROBIC BOTTLES CRITICAL RESULT CALLED TO, READ BACK BY AND VERIFIED WITH: PHARMD M.SWAYME AT 5465 ON 07/23/2021 BY T.SAAD. Performed at [facility], [place] [address] [place], [place] [address]    Culture STAPHYLOCOCCUS AUREUS (A)  Final   Report Status 07/25/2021 FINAL  Final   Organism ID, Bacteria STAPHYLOCOCCUS AUREUS  Final      Susceptibility   Staphylococcus aureus - MIC*    CIPROFLOXACIN <=0.5 SENSITIVE Sensitive     ERYTHROMYCIN RESISTANT Resistant     GENTAMICIN <=0.5 SENSITIVE Sensitive     OXACILLIN <=0.25 SENSITIVE Sensitive     TETRACYCLINE <=1 SENSITIVE Sensitive     VANCOMYCIN 1 SENSITIVE Sensitive     TRIMETH/SULFA <=10 SENSITIVE Sensitive     CLINDAMYCIN RESISTANT Resistant     RIFAMPIN <=0.5 SENSITIVE Sensitive     Inducible Clindamycin POSITIVE Resistant     * STAPHYLOCOCCUS AUREUS  Blood Culture ID Panel (Reflexed)     Status: Abnormal   Collection Time: 07/22/21  2:11 PM  Result Value Ref Range Status   Enterococcus faecalis NOT DETECTED NOT DETECTED Final   Enterococcus Faecium NOT DETECTED NOT DETECTED Final   Listeria monocytogenes NOT DETECTED NOT DETECTED Final   Staphylococcus species DETECTED (A) NOT DETECTED Final    Comment: CRITICAL RESULT CALLED TO, READ BACK BY AND VERIFIED WITH: PHARMD M.SWAYME AT 1547 ON 07/23/2021 BY T.SAAD.    Staphylococcus aureus (BCID) DETECTED (A) NOT DETECTED Final    Comment: CRITICAL RESULT CALLED TO, READ BACK BY AND VERIFIED WITH: PHARMD M.SWAYME AT 1547 ON 07/23/2021 BY T.SAAD.    Staphylococcus epidermidis NOT DETECTED NOT DETECTED Final   Staphylococcus lugdunensis NOT DETECTED NOT DETECTED Final   Streptococcus species NOT DETECTED NOT DETECTED Final   Streptococcus agalactiae NOT DETECTED NOT DETECTED Final   Streptococcus pneumoniae NOT DETECTED NOT DETECTED Final   Streptococcus pyogenes NOT DETECTED  NOT DETECTED Final   A.calcoaceticus-baumannii NOT DETECTED NOT DETECTED Final   Bacteroides fragilis NOT DETECTED NOT DETECTED Final   Enterobacterales NOT DETECTED NOT DETECTED Final   Enterobacter cloacae complex NOT DETECTED NOT DETECTED Final   Escherichia coli NOT DETECTED NOT DETECTED Final   Klebsiella aerogenes NOT DETECTED NOT DETECTED Final   Klebsiella oxytoca NOT DETECTED NOT DETECTED Final   Klebsiella pneumoniae NOT DETECTED NOT DETECTED Final   Proteus species NOT DETECTED NOT DETECTED Final   Salmonella species NOT DETECTED NOT DETECTED Final  Serratia marcescens NOT DETECTED NOT DETECTED Final   Haemophilus influenzae NOT DETECTED NOT DETECTED Final   Neisseria meningitidis NOT DETECTED NOT DETECTED Final   Pseudomonas aeruginosa NOT DETECTED NOT DETECTED Final   Stenotrophomonas maltophilia NOT DETECTED NOT DETECTED Final   Candida albicans NOT DETECTED NOT DETECTED Final   Candida auris NOT DETECTED NOT DETECTED Final   Candida glabrata NOT DETECTED NOT DETECTED Final   Candida krusei NOT DETECTED NOT DETECTED Final   Candida parapsilosis NOT DETECTED NOT DETECTED Final   Candida tropicalis NOT DETECTED NOT DETECTED Final   Cryptococcus neoformans/gattii NOT DETECTED NOT DETECTED Final   Meth resistant mecA/C and MREJ NOT DETECTED NOT DETECTED Final    Comment: Performed at [facility], [place] [address] [place], [place] [address]  Culture, blood (routine x 2)     Status: None   Collection Time: 07/22/21  3:13 PM   Specimen: BLOOD  Result Value Ref Range Status   Specimen Description   Final    BLOOD LEFT ANTECUBITAL Performed at [facility] [address], [place], [place] [address]    Special Requests   Final    BOTTLES DRAWN AEROBIC ONLY Blood Culture results may not be optimal due to an inadequate volume of blood received in culture bottles Performed at [place] [address] [place], [place] [address]     Culture   Final    NO GROWTH 5 DAYS Performed at [facility], [place] [address], [place], [place] [address]    Report Status 07/27/2021 FINAL  Final  SARS CORONAVIRUS 2 (TAT 6-24 HRS) Nasopharyngeal     Status: None   Collection Time: 07/22/21  6:07 PM   Specimen: Nasopharyngeal  Result Value Ref Range Status   SARS Coronavirus 2 NEGATIVE NEGATIVE Final    Comment: (NOTE) SARS-CoV-2 target nucleic acids are NOT DETECTED.  The SARS-CoV-2 RNA is generally detectable in upper and lower respiratory specimens during the acute phase of infection. Negative results do not preclude SARS-CoV-2 infection, do not rule out co-infections with other pathogens, and should not be used as the sole basis for treatment or other patient management decisions. Negative results must be combined with clinical observations, patient history, and epidemiological information. The expected result is Negative.  Fact Sheet for Patients: SugarRoll.be  Fact Sheet for Healthcare Providers: https://www.woods-mathews.com/  This test is not yet approved or cleared by the [place] FDA and  has been authorized for detection and/or diagnosis of SARS-CoV-2 by FDA under an Emergency Use Authorization (EUA). This EUA will remain  in effect (meaning this test can be used) for the duration of the COVID-19 declaration under Se ction 564(b)(1) of the Act, 21 U.S.C. section 360bbb-3(b)(1), unless the authorization is terminated or revoked sooner.  Performed at [facility], [place] [address] [place], [place] [address]   Culture, blood (routine x 2)     Status: None   Collection Time: 07/24/21  5:04 PM   Specimen: BLOOD  Result Value Ref Range Status   Specimen Description   Final    BLOOD RIGHT ANTECUBITAL Performed at [facility] [address] [place], [place] [address]    Special Requests   Final    BOTTLES DRAWN AEROBIC AND ANAEROBIC  Blood Culture adequate volume Performed at [place] [address], [place], [place] [address]    Culture   Final    NO GROWTH 5 DAYS Performed at  [facility], [place] [address] [place], [place] [address]    Report Status 07/29/2021 FINAL  Final  Culture, blood (routine x 2)     Status: None   Collection Time: 07/24/21  5:04 PM   Specimen: BLOOD  Result Value Ref Range Status   Specimen Description   Final    BLOOD LEFT ANTECUBITAL Performed at [place] [address] [place], [place] [address]    Special Requests   Final    BOTTLES DRAWN AEROBIC AND ANAEROBIC Blood Culture adequate volume Performed at [facility] [address] [place], [place] [address]    Culture   Final    NO GROWTH 5 DAYS Performed at [facility], [place] [address] [place],  [address]    Report Status 07/29/2021 FINAL  Final      Studies: ECHO TEE  Result Date: 07/29/2021    TRANSESOPHOGEAL ECHO REPORT   Patient Name:   KAIREN HALLINAN Date of Exam: 07/29/2021 Medical Rec #:  [id number]       Height:       71.0 in Accession #:    [id number]      Weight:       190.0 lb Date of Birth:  [birth year]       BSA:          2.063 m Patient Age:    67 years        BP:           148/71 mmHg Patient Gender: M               HR:           103 bpm. Exam Location:  Inpatient Procedure: Transesophageal Echo, Color Doppler and Saline Contrast Bubble Study Indications:    Bacteremia  History:        Patient has prior history of Echocardiogram examinations, most                 recent 07/24/2021.  Sonographer:    Philipp Deputy RDCS Referring Phys: [id number] Margie Billet PROCEDURE: After discussion of the risks and benefits of a TEE, an informed consent was obtained from the patient. The transesophogeal probe was passed without difficulty through the esophogus of the patient. Imaged were obtained with the patient in a left lateral decubitus position.  Sedation performed by different physician. The patient was monitored while under deep sedation. Anesthestetic sedation was provided intravenously by Anesthesiology: 245m of Propofol. Image quality was adequate. The patient's vital signs; including heart rate, blood pressure, and oxygen saturation; remained stable throughout the procedure. The patient developed no complications during the procedure. IMPRESSIONS  1. Left ventricular ejection fraction, by estimation, is 60 to 65%. The left ventricle has normal function. The left ventricle has no regional wall motion abnormalities.  2. Right ventricular systolic function is normal. The right ventricular size is normal.  3. No left atrial/left atrial appendage thrombus was detected.  4. The mitral valve is normal in structure. Trivial mitral valve regurgitation. No evidence of mitral stenosis.  5. The aortic valve is normal in structure. Aortic valve regurgitation is not visualized. No aortic stenosis is present.  6. The inferior vena cava is normal in size with greater than 50% respiratory variability, suggesting right atrial pressure of 3 mmHg.  7. Evidence of atrial level shunting detected by color flow Doppler. Agitated saline contrast bubble study was positive with shunting observed within 3-6 cardiac cycles suggestive  of interatrial shunt. There is a small patent foramen ovale with bidirectional shunting across atrial septum. 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 FINDINGS  Left Ventricle: Left ventricular ejection fraction, by estimation, is 60 to 65%. The left ventricle has normal function. The left ventricle has no regional wall motion abnormalities. The left ventricular internal cavity size was normal in size. There is  no left ventricular hypertrophy. Right Ventricle: The right ventricular size is normal. No increase in right ventricular wall thickness. Right ventricular  systolic function is normal. Left Atrium: Left atrial size was normal in size. No left atrial/left atrial appendage thrombus was detected. Right Atrium: Right atrial size was normal in size. Pericardium: There is no evidence of pericardial effusion. Mitral Valve: The mitral valve is normal in structure. Trivial mitral valve regurgitation. No evidence of mitral valve stenosis. Tricuspid Valve: The tricuspid valve is normal in structure. Tricuspid valve regurgitation is not demonstrated. No evidence of tricuspid stenosis. Aortic Valve: The aortic valve is normal in structure. Aortic valve regurgitation is not visualized. No aortic stenosis is present. Pulmonic Valve: The pulmonic valve was normal in structure. Pulmonic valve regurgitation is not visualized. No evidence of pulmonic stenosis. Aorta: The aortic root is normal in size and structure. Venous: The inferior vena cava is normal in size with greater than 50% respiratory variability, suggesting right atrial pressure of 3 mmHg. IAS/Shunts: Evidence of atrial level shunting detected by color flow Doppler. Agitated saline contrast was given intravenously to evaluate for intracardiac shunting. Agitated saline contrast bubble study was positive with shunting observed within 3-6 cardiac cycles suggestive of interatrial shunt. A small patent foramen ovale is detected with bidirectional shunting across atrial septum. Candee Furbish MD Electronically signed by Candee Furbish MD Signature Date/Time: 07/29/2021/2:53:55 PM    Final     Scheduled Meds:  amLODipine  10 mg Oral Daily   atorvastatin  40 mg Oral Daily   collagenase   Topical Daily   cyclobenzaprine  10 mg Oral BID   feeding supplement  237 mL Oral BID BM   fentaNYL  1 patch Transdermal Q72H   gabapentin  200 mg Oral TID   heparin  5,000 Units Subcutaneous Q8H   hydrALAZINE  50 mg Oral Q8H   insulin aspart  0-9 Units Subcutaneous Q6H   lactulose  20 g Oral BID   multivitamin with minerals  1 tablet Oral  Daily   mupirocin ointment  1 application Topical BID   senna-docusate  1 tablet Oral BID   traZODone  100 mg Oral QHS   verapamil  120 mg Oral Daily    Continuous Infusions:   ceFAZolin (ANCEF) IV 2 g (07/29/21 1512)   chlorproMAZINE (THORAZINE) IV 25 mg (07/26/21 0854)     LOS: 7 days     Annita Brod, MD [facility]   07/29/2021, 5:58 PM

## 2021-07-29 NOTE — Anesthesia Preprocedure Evaluation (Signed)
Anesthesia Evaluation  Patient identified by MRN, date of birth, ID band Patient awake    Reviewed: Allergy & Precautions, NPO status , Patient's Chart, lab work & pertinent test results  Airway Mallampati: II  TM Distance: >3 FB Neck ROM: Full    Dental  (+) Teeth Intact, Dental Advisory Given   Pulmonary neg pulmonary ROS, former smoker,    Pulmonary exam normal breath sounds clear to auscultation       Cardiovascular hypertension, Pt. on medications  Rhythm:Regular Rate:Tachycardia     Neuro/Psych PSYCHIATRIC DISORDERS Anxiety  Neuromuscular disease    GI/Hepatic negative GI ROS, Neg liver ROS,   Endo/Other  diabetes, Type 2, Oral Hypoglycemic Agents  Renal/GU negative Renal ROS     Musculoskeletal negative musculoskeletal ROS (+)   Abdominal   Peds  Hematology  (+) Blood dyscrasia, anemia , Bacteremia   Anesthesia Other Findings Day of surgery medications reviewed with the patient.  Reproductive/Obstetrics                             Anesthesia Physical Anesthesia Plan  ASA: 3  Anesthesia Plan: MAC   Post-op Pain Management:    Induction: Intravenous  PONV Risk Score and Plan: 1 and Propofol infusion and Treatment may vary due to age or medical condition  Airway Management Planned: Nasal Cannula  Additional Equipment:   Intra-op Plan:   Post-operative Plan:   Informed Consent: I have reviewed the patients History and Physical, chart, labs and discussed the procedure including the risks, benefits and alternatives for the proposed anesthesia with the patient or authorized representative who has indicated his/her understanding and acceptance.     Dental advisory given  Plan Discussed with: CRNA and Anesthesiologist  Anesthesia Plan Comments:         Anesthesia Quick Evaluation

## 2021-07-30 ENCOUNTER — Inpatient Hospital Stay: Payer: Self-pay

## 2021-07-30 ENCOUNTER — Encounter (HOSPITAL_COMMUNITY): Payer: Self-pay | Admitting: Cardiology

## 2021-07-30 DIAGNOSIS — Z515 Encounter for palliative care: Secondary | ICD-10-CM | POA: Diagnosis not present

## 2021-07-30 DIAGNOSIS — M899 Disorder of bone, unspecified: Secondary | ICD-10-CM | POA: Diagnosis not present

## 2021-07-30 DIAGNOSIS — M898X9 Other specified disorders of bone, unspecified site: Secondary | ICD-10-CM | POA: Diagnosis not present

## 2021-07-30 LAB — GLUCOSE, CAPILLARY
Glucose-Capillary: 133 mg/dL — ABNORMAL HIGH (ref 70–99)
Glucose-Capillary: 129 mg/dL — ABNORMAL HIGH (ref 70–99)

## 2021-07-30 LAB — SURGICAL PATHOLOGY

## 2021-07-30 MED ORDER — FENTANYL 50 MCG/HR TD PT72
1.0000 | MEDICATED_PATCH | TRANSDERMAL | Status: DC
  Administered 2021-07-30 – 2021-08-02 (×2): 1 via TRANSDERMAL
  Filled 2021-07-30 (×3): qty 1

## 2021-07-30 MED ORDER — OXYCODONE HCL 5 MG PO TABS
10.0000 mg | ORAL_TABLET | ORAL | Status: DC | PRN
  Administered 2021-07-31 – 2021-08-03 (×16): 10 mg via ORAL
  Filled 2021-07-30 (×16): qty 2

## 2021-07-30 MED ORDER — BOOST / RESOURCE BREEZE PO LIQD CUSTOM
1.0000 | Freq: Three times a day (TID) | ORAL | Status: DC
  Administered 2021-07-30 – 2021-07-31 (×3): 1 via ORAL

## 2021-07-30 NOTE — Progress Notes (Signed)
   07/30/21 2015  Assess: MEWS Score  Temp 99.3 F (37.4 C)  BP (!) 164/92  Pulse Rate (!) 111  SpO2 92 %  O2 Device Nasal Cannula  O2 Flow Rate (L/min) 2 L/min  Assess: MEWS Score  MEWS Temp 0  MEWS Systolic 0  MEWS Pulse 2  MEWS RR 0  MEWS LOC 0  MEWS Score 2  MEWS Score Color Yellow  Assess: if the MEWS score is Yellow or Red  Were vital signs taken at a resting state? Yes  Focused Assessment No change from prior assessment  Does the patient meet 2 or more of the SIRS criteria? No  Does the patient have a confirmed or suspected source of infection? Yes  Provider and Rapid Response Notified? No  Early Detection of Sepsis Score *See Row Information* Low  MEWS guidelines implemented *See Row Information* No, previously yellow, continue vital signs every 4 hours (Pt has been in yellow d/t pulse has been on cont q4h VS)  Assess: SIRS CRITERIA  SIRS Temperature  0  SIRS Pulse 1  SIRS Respirations  0  SIRS WBC 0  SIRS Score Sum  1

## 2021-07-30 NOTE — Progress Notes (Signed)
PROGRESS NOTE    Jose [name]  FYT:[id number]  DOB: 1976/06/04  DOA: 07/22/2021 PCP: [name], NP Outpatient Specialists:   Hospital course:  Patient 45 year old gentleman history of type 2 diabetes with retinopathy, neuropathy, microalbuminemia, hypertension, hyperlipidemia, chronic kidney disease stage III (baseline 1.1-1.2), COVID-19 infection July 2022 presented to the ED with complaints of chest pain radiating to bilateral shoulder blades x6 weeks.  Seen by PCP x-ray done showed abnormal curvature of the spine MRI ordered which was scheduled for October however due to worsening symptoms presented to the ED.  Patient noted to be afebrile.  Systolic blood pressures in the 160s.  Patient with a leukocytosis, hemoglobin at 7.5, D-dimer slightly elevated with VQ scan negative for PE.  CT chest without contrast with lytic bone lesions involving T5 and T6 and the sternum, concern for pathologic fracture of T6 with paraspinal hematoma or tumor could represent metastatic bone disease or possibly myeloma.  Patient pancultured with blood cultures 3/4 bottles positive for MSSA.  Case discussed with oncology who will formally consulted and recommended a multiple myeloma panel and urine light chains and LDH level which were ordered.  Patient placed on gentle hydration.  Patient placed on pain management.  Patient admitted for further evaluation and work-up.  Patient being followed by ID due to MSSA bacteremia and hematology/oncology.  Patient for lytic lesion biopsy/pathology 07/28/2021 per IR.   Subjective:  Patient himself complains of pain.  "I feel like I am in a hurt locker".  Patient's wife is very frustrated and unhappy with what she perceives to be lack of communication with her.  She does not understand why there cannot be a definitive diagnosis given.  She feels maybe he needs to be transferred to another hospital where they can give her a definitive diagnosis.  She is also worried  that his pain is not well controlled and notes that she has children at home, she is trying to complete a degree as well as having a full-time job and and inform parent that she is also try to take care of so does not feel like she can manage him at home in the condition that he presently is in.   Objective: Vitals:   07/30/21 0534 07/30/21 0608 07/30/21 0916 07/30/21 1345  BP: (!) 164/92 (!) 163/94 (!) 179/96 (!) 153/88  Pulse: (!) 109 (!) 111 (!) 112 (!) 114  Resp:  14 18 18   Temp: 98.3 F (36.8 C) 98.3 F (36.8 C) 98.8 F (37.1 C) 98.4 F (36.9 C)  TempSrc: Oral Oral Oral Oral  SpO2: 96% 97% 95% 96%  Weight:      Height:        Intake/Output Summary (Last 24 hours) at 07/30/2021 1639 Last data filed at 07/30/2021 1346 Gross per 24 hour  Intake 240 ml  Output 2100 ml  Net -1860 ml   Filed Weights   07/27/21 0622 07/29/21 0500 07/29/21 1136  Weight: 85.7 kg 85.9 kg 86.2 kg     Exam:  General: Patient lying in bed with eyes closed appearing sleepy. Eyes: sclera anicteric, conjuctiva mild injection bilaterally CVS: S1-S2, regular  Respiratory:  decreased air entry bilaterally secondary to decreased inspiratory effort, rales at bases  GI: NABS, soft, NT  LE: No edema.    Assessment & Plan:   Lytic bone lesions in setting of MSSA bacteremia Thought to be likely secondary to osteomyelitis as a diagnosis of exclusion. Patient was ruled out for malignancy and multiple myeloma, bone  biopsy negative Plan is to continue outpatient therapy with Kefzol x6 weeks per ID PICC line will need to be placed and patient's wife will need to be educated on infusion Patient is ready for discharge when pain is controlled  The following history is copied from previous note: -Patient presented with 6-week history of chest pain.  Patient with recently diagnosed chronic kidney disease in March 2022 per patient. -CT chest with lytic bone disease involving T5 and T6 and possibly the sternum,  pathologic fracture of T6 with paraspinal hematoma or tumor, findings suggestive of metastatic bone disease or possibly myeloma. -Radiology recommended MRI of the T and L-spine with and without contrast which was ordered after discussions with Dr. Burr Medico, oncology. -CT abdomen and pelvis also ordered to follow-up on any further lesions. -Multiple myeloma panel, urine light chains and LDH level ordered. -Oncology has assessed patient, following and initially felt patient may need bone marrow biopsy however that has been canceled as they feel lesions may be infectious related.  Patient with no hypercalcemia.  -IR consultation and evaluation for possible kyphoplasty which at this time likely on hold until bacteremia is treated.. -Patient being followed by oncology and per their note states given limited bone involvement and T4/5 without other tissue bone marrow signal on the scan multiple myeloma less likely.  Oncology suspecting thoracic spine bone lesion could likely be to osteomyelitis. -Per oncology slight elevation in both kappa and lambda chains and not supporting diagnosis of multiple myeloma at this time. -Patient with a left great toe wound with pus noted and necrotic area likely source of bacteremia however podiatry doubts this is the source of bacteremia..   -TTE and TEE are both negative for vegetations.    Back pain Appreciate palliative care involvement for pain management. They have recommended initiation of fentanyl patch in anticipation of discharge when pain is controlled Continue Xanax, Flexeril, Tylenol and Neurontin  AKI on CKD 3A Creatinine has been essentially stable at around 2 since admission Will need to follow as we treat infection Continue to hold ARB  Volume overload noted on 10/3 Treated with Lasix with resolution  DM2 Continue present management  HTN BP elevated despite increase of amlodipine to 10 mg Of note patient is also on verapamil, may need to address the  need for two CCBs. Lasix and ARB being held due to worsening of CKD Can consider addition of beta-blocker if ARB cannot be restarted on at discharge  Disposition Patient can be discharged once pain is better controlled Patient's wife is very concerned about taking him home Case management is aware and working with family Charge nurse alerted to patient's wife's deep frustration with communication issues   Copied from previous note Acute on chronic anemia/low vitamin B12 levels -Hemoglobin noted at baseline to be approximately 9 in July. -Patient presented he had a hemoglobin as low as 7.5 likely secondary to chronic kidney disease versus malignancy. -Patient with no overt bleeding. -Hemoglobin went as low as 7.1 on 07/25/2021 patient transfused 1 unit packed red blood cells.   -Hemoglobin currently at 8.4 today.  -May need IV iron during this hospitalization however will defer to oncology. -Vitamin B12 levels at 252 and as such patient placed on vitamin B12 supplementation of subcutaneous during the hospitalization and transition to oral vitamin B12 on discharge.  - follow H&H.  Constipation -Patient with hard stools noted to be small over the past 2 days.   -Continue lactulose twice daily, Senokot-S twice daily, Dulcolax suppositories daily.   -  Enema ordered on 07/26/2021..   -Continue current bowel regimen.    Left foot ulcer/wound, poa -Patient seen by wound care RN who assessed wound and noted pain content pus that pulled out from 2 different areas of the toe with a necrotic area noted. -Concern this might be source for patient's MSSA however podiatry doubts if this is the source and podiatry no further immediate work-up needed for the left hallux warranted at this time which ID is in agreement with. -Continue current wound care as recommended by podiatry.   DVT prophylaxis: Subcu heparin Code Status: Full Family Communication: Spoke at length with patient's wife  Erin Disposition Plan:   Patient is from: Home  Anticipated Discharge Location: Home  Barriers to Discharge: Patient's wife is unhappy with patient's level of functionality and pain control  Is patient medically stable for Discharge: Yes   Scheduled Meds:  amLODipine  10 mg Oral Daily   atorvastatin  40 mg Oral Daily   collagenase   Topical Daily   cyclobenzaprine  10 mg Oral BID   feeding supplement  1 Container Oral TID BM   fentaNYL  1 patch Transdermal Q72H   gabapentin  200 mg Oral TID   heparin  5,000 Units Subcutaneous Q8H   hydrALAZINE  50 mg Oral Q8H   insulin aspart  0-9 Units Subcutaneous Q6H   lactulose  20 g Oral BID   multivitamin with minerals  1 tablet Oral Daily   mupirocin ointment  1 application Topical BID   senna-docusate  1 tablet Oral BID   traZODone  100 mg Oral QHS   verapamil  120 mg Oral Daily   Continuous Infusions:   ceFAZolin (ANCEF) IV 2 g (07/30/21 1417)   chlorproMAZINE (THORAZINE) IV 25 mg (07/26/21 0854)    Data Reviewed:  Basic Metabolic Panel: Recent Labs  Lab 07/24/21 0550 07/25/21 8657 07/26/21 0735 07/27/21 0502 07/28/21 0520 Aug 12, 2021 0517  NA 131* 135 137 137 135 138  K 4.3 4.5 4.4 4.7 4.0 4.2  CL 101 103 103 104 103 103  CO2 24 22 24  20* 23 23  GLUCOSE 133* 165* 222* 158* 134* 138*  BUN 26* 26* 25* 27* 26* 28*  CREATININE 2.15* 2.21* 2.02* 2.09* 1.93* 1.92*  CALCIUM 8.4* 8.9 9.3 9.1 9.0 9.3  MG 1.8  --   --   --   --   --   PHOS  --   --  4.0  --   --  5.4*    CBC: Recent Labs  Lab 07/24/21 0550 07/24/21 1423 07/25/21 0611 07/25/21 1824 07/26/21 0735 07/27/21 0502 07/28/21 0520  WBC 13.0*  --  12.9*  --  13.7* 12.8* 12.5*  NEUTROABS 9.2*  --  9.4*  --  10.4* 9.6* 9.3*  HGB 7.2*   < > 7.1* 8.6* 8.1* 7.8* 8.4*  HCT 22.1*   < > 21.5* 26.0* 24.1* 23.5* 25.2*  MCV 84.4  --  85.0  --  84.9 84.8 84.0  PLT 332  --  321  --  310 291 358   < > = values in this interval not displayed.    Studies: ECHO  TEE  Result Date: 08-12-21    TRANSESOPHOGEAL ECHO REPORT   Patient Name:   Jose [name] Date of Exam: 08/12/2021 Medical Rec #:  [id number]       Height:       71.0 in Accession #:    [id number]      Weight:  190.0 lb Date of Birth:  Mar 28, 1976       BSA:          2.063 m Patient Age:    60 years        BP:           148/71 mmHg Patient Gender: M               HR:           103 bpm. Exam Location:  Inpatient Procedure: Transesophageal Echo, Color Doppler and Saline Contrast Bubble Study Indications:    Bacteremia  History:        Patient has prior history of Echocardiogram examinations, most                 recent 07/24/2021.  Sonographer:    Philipp Deputy RDCS Referring Phys: [id number] Margie Billet PROCEDURE: After discussion of the risks and benefits of a TEE, an informed consent was obtained from the patient. The transesophogeal probe was passed without difficulty through the esophogus of the patient. Imaged were obtained with the patient in a left lateral decubitus position. Sedation performed by different physician. The patient was monitored while under deep sedation. Anesthestetic sedation was provided intravenously by Anesthesiology: 276m of Propofol. Image quality was adequate. The patient's vital signs; including heart rate, blood pressure, and oxygen saturation; remained stable throughout the procedure. The patient developed no complications during the procedure. IMPRESSIONS  1. Left ventricular ejection fraction, by estimation, is 60 to 65%. The left ventricle has normal function. The left ventricle has no regional wall motion abnormalities.  2. Right ventricular systolic function is normal. The right ventricular size is normal.  3. No left atrial/left atrial appendage thrombus was detected.  4. The mitral valve is normal in structure. Trivial mitral valve regurgitation. No evidence of mitral stenosis.  5. The aortic valve is normal in structure. Aortic valve regurgitation is not visualized. No  aortic stenosis is present.  6. The inferior vena cava is normal in size with greater than 50% respiratory variability, suggesting right atrial pressure of 3 mmHg.  7. Evidence of atrial level shunting detected by color flow Doppler. Agitated saline contrast bubble study was positive with shunting observed within 3-6 cardiac cycles suggestive of interatrial shunt. There is a small patent foramen ovale with bidirectional shunting across atrial septum. 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 FINDINGS  Left Ventricle: Left ventricular ejection fraction, by estimation, is 60 to 65%. The left ventricle has normal function. The left ventricle has no regional wall motion abnormalities. The left ventricular internal cavity size was normal in size. There is  no left ventricular hypertrophy. Right Ventricle: The right ventricular size is normal. No increase in right ventricular wall thickness. Right ventricular systolic function is normal. Left Atrium: Left atrial size was normal in size. No left atrial/left atrial appendage thrombus was detected. Right Atrium: Right atrial size was normal in size. Pericardium: There is no evidence of pericardial effusion. Mitral Valve: The mitral valve is normal in structure. Trivial mitral valve regurgitation. No evidence of mitral valve stenosis. Tricuspid Valve: The tricuspid valve is normal in structure. Tricuspid valve regurgitation is not demonstrated. No evidence of tricuspid stenosis. Aortic Valve: The aortic valve is normal in structure. Aortic valve regurgitation is not visualized. No aortic stenosis is present. Pulmonic Valve: The pulmonic valve was normal in structure. Pulmonic valve regurgitation is not visualized. No evidence of pulmonic stenosis. Aorta: The aortic root is normal in size  and structure. Venous: The inferior vena cava is normal in size with greater than 50% respiratory variability,  suggesting right atrial pressure of 3 mmHg. IAS/Shunts: Evidence of atrial level shunting detected by color flow Doppler. Agitated saline contrast was given intravenously to evaluate for intracardiac shunting. Agitated saline contrast bubble study was positive with shunting observed within 3-6 cardiac cycles suggestive of interatrial shunt. A small patent foramen ovale is detected with bidirectional shunting across atrial septum. Candee Furbish MD Electronically signed by Candee Furbish MD Signature Date/Time: 07/29/2021/2:53:55 PM    Final    [place] EKG SITE RITE  Result Date: 07/30/2021 If Site Rite image not attached, placement could not be confirmed due to current cardiac rhythm.   Principal Problem:   Lytic bone lesions on xray Active Problems:   Type 2 diabetes mellitus with diabetic polyneuropathy, without long-term current use of insulin (HCC)   Hyperlipidemia   GAD (generalized anxiety disorder)   AKI (acute kidney injury) ([place])   Anemia   MSSA bacteremia   Constipation   Diabetic ulcer of left great toe ([place])     Letecia Arps Derek Jack, [facility]  If 7PM-7AM, please contact night-coverage [email or web address]   LOS: 8 days

## 2021-07-30 NOTE — Progress Notes (Cosign Needed Addendum)
       Durable Medical Equipment  (From admission, onward)           Start     Ordered   07/30/21 1345  For home use only DME 3 n 1  Once        07/30/21 1344   07/30/21 1345  For home use only DME Walker rolling  Once       Question Answer Comment  Walker: With 5 Inch Wheels   Patient needs a walker to treat with the following condition Weakness      07/30/21 1344

## 2021-07-30 NOTE — Progress Notes (Addendum)
Diagnosis: Mssa bacteremia T5-6 vertebral lytic lesion and sternal lytic lesion presumed OM Polypharmacy   Negative tee 9/28 bcx positive 9/30 bcx negative  Pending t5-6 vertebral body biopsy to r/o malignancy. Negative MM serologic workup  No Known Allergies  OPAT Orders Discharge antibiotics to be given via PICC line Discharge antibiotics: cefazolin  Duration: 6 weeks End Date: 11/11 (corrected date, not 10/11)  PIC Care Per Protocol:  Home health RN for IV administration and teaching; PICC line care and labs.    Labs weekly while on IV antibiotics: _x_ CBC with differential __ BMP _x_ CMP _x_ CRP __ ESR __ Vancomycin trough __ CK  _x_ Please pull PIC at completion of IV antibiotics __ Please leave PIC in place until doctor has seen patient or been notified  Fax weekly labs to [phone]  Clinic Follow Up Appt: 10/24 @ 345 with Dr Gale Journey  @  [facility] [address] [place], [place], [place] [address] Phone: [phone]

## 2021-07-30 NOTE — Progress Notes (Addendum)
PHARMACY CONSULT NOTE FOR:  OUTPATIENT  PARENTERAL ANTIBIOTIC THERAPY (OPAT)  Indication: Osteomyelitis  Regimen: Cefazolin 2g IV Q8h  End date: 09/04/2021   IV antibiotic discharge orders are pended. To discharging provider:  please sign these orders via discharge navigator,  Select New Orders & click on the button choice - Manage This Unsigned Work.     Thank you for allowing pharmacy to be a part of this patient's care.  Adria Dill, PharmD PGY-1 Acute Care Resident  07/30/2021 9:58 AM

## 2021-07-30 NOTE — TOC Initial Note (Signed)
Transition of Care [facility]) - Initial/Assessment Note    Patient Details  Name: Jose [name] MRN: [id number] Date of Birth: 08-04-1976  [facility] CM/SW Contact:    Taite Schoeppner, Marjie Skiff, RN Phone Number: 07/30/2021, 2:47 PM  Clinical Narrative:                 Spoke with [place] liaison who received notification that pt would need to dc on IV abx. Spoke with wife at length for dc planning. Wife requesting hospital bed, RW and 3in1. Unsure if pt has a qualifying diagnosis for the hospital bed at this time. [facility] contacted for RN and 3in1. [place] liaison to come do teaching with pt wife Junie Panning tomorrow in the pt room. TOC will continue to follow and assist as needed.  Expected Discharge Plan: [place] Barriers to Discharge: Continued Medical Work up Expected Discharge Plan and Services Expected Discharge Plan: [place]   Discharge Planning Services: CM Consult   Living arrangements for the past 2 months: Single Family Home                 DME Arranged: Walker rolling, 3-N-1 DME Agency: [facility] Date DME Agency Contacted: 07/30/21 Time DME Agency Contacted: 96 Representative spoke with at DME Agency: Adela Lank HH Arranged: RN, IV Antibiotics HH Agency: [facility] Date HH Agency Contacted: 07/30/21   Representative spoke with at [place]: [place] Arrangements/Services Living arrangements for the past 2 months: [place] with:: Spouse          Need for Family Participation in Patient Care: Yes (Comment) Care giver support system in place?: Yes (comment)   Criminal Activity/Legal Involvement Pertinent to Current Situation/Hospitalization: No - Comment as needed  Activities of Daily Living Home Assistive Devices/Equipment: Eyeglasses, Cane (specify quad or straight) (reading glasses) ADL Screening (condition at time of admission) Patient's cognitive ability adequate to safely complete daily  activities?: Yes Is the patient deaf or have difficulty hearing?: No Does the patient have difficulty seeing, even when wearing glasses/contacts?: No (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) Does the patient have difficulty concentrating, remembering, or making decisions?: No Patient able to express need for assistance with ADLs?: Yes Does the patient have difficulty dressing or bathing?: No Independently performs ADLs?: Yes (appropriate for developmental age) Does the patient have difficulty walking or climbing stairs?: No Weakness of Legs: Both Weakness of Arms/Hands: Both  Permission Sought/Granted                  Emotional Assessment Appearance:: Appears stated age            Admission diagnosis:  Anemia [D64.9] Patient Active Problem List   Diagnosis Date Noted   Diabetic ulcer of left great toe ([place])    MSSA bacteremia 07/23/2021   Constipation 07/23/2021   Lytic bone lesions on xray 07/22/2021   AKI (acute kidney injury) ([place]) 05/19/2021   Anemia 05/19/2021   Nausea and vomiting 05/19/2021   Elevated blood pressure reading 05/19/2021   COVID-19 05/18/2021   Bilateral carpal tunnel syndrome 01/23/2020   Numbness 12/12/2019   Microalbuminuria due to type 2 diabetes mellitus ([place]) 07/20/2019   GAD (generalized anxiety disorder) 06/18/2019   Adult ADHD 06/18/2019   Trigger middle finger of left hand 06/11/2019   Diabetic retinopathy ([place]) 07/13/2018   Type 2 diabetes mellitus with diabetic polyneuropathy, without long-term current use of insulin ([place]) 05/17/2018  Tobacco use disorder 05/17/2018   Hyperlipidemia 05/17/2018   PCP:  [name], NP Pharmacy:   [facility] [place], [place] - [address] AT [facility] [address] [place] [address] Phone: [phone] Fax: [phone]     Social Determinants of Health (SDOH) Interventions    Readmission Risk  Interventions Readmission Risk Prevention Plan 07/30/2021  Transportation Screening Complete  PCP or Specialist Appt within 3-5 Days Complete  HRI or [place] Complete  Social Work Consult for [place] Planning/Counseling Complete  Palliative Care Screening Complete  Medication Review [employer]) Complete  Some recent data might be hidden

## 2021-07-30 NOTE — Progress Notes (Signed)
Nutrition Follow-up  DOCUMENTATION CODES:   Not applicable  INTERVENTION:   Boost Breeze po TID, each supplement provides 250 kcal and 9 grams of protein  [employer] Breakfast mixed with Lactaid milk with meals  Yogurt with meals  Discussed importance of nutrition with wife, encouraged offering ONS and high protein foods first  If PO intake remains poor due to pain and poor appetite recommend short term enteral nutrition support    NUTRITION DIAGNOSIS:   Increased nutrient needs related to chronic illness as evidenced by estimated needs. Ongoing.   GOAL:   Patient will meet greater than or equal to 90% of their needs Not met.   MONITOR:   PO intake, Supplement acceptance, Labs, Weight trends, I & O's  REASON FOR ASSESSMENT:   Malnutrition Screening Tool    ASSESSMENT:   45 y.o. male with PMH significant for DM2 with complications including retinopathy, neuropathy, microalbuminuria; HTN, HLD, CKD 3  (baseline creatinine 1.1-1.2), COVID-19 infection in July 2022.  Pt is very high risk for malnutrition, will monitor.  Per MD pt with lytic bone lesions on xray concerning for malignancy vs osteomyelitis. Awaiting bone biopsy results.   Weight on admission 193 lb, last weight 189 lb Pt awakens to voice but does not answer many questions. He is currently in pain and has recently received pain medication per wife. Wife provide hx and reports that pt got COVID-19 for the first time in 04/2021 and was hospitalized. Pre-COVID she believes pt weighed 180 lb. She feels that he has lost weight. Per chart review pt was 208 lb back in March of 2022. Unable to confirm weight trends.  Pt does have evidence of muscle depletion of BLE due to inactivity and poor intake.   After d/c from hospital she reports that his intake was back to normal. Not long after pt started having progressive pain in his side and increasing pressure in his chest which started ~ 06/09/21. Pt has had to  sleep on the sofa for 3 weeks PTA due to pain and discomfort. During exam pt winces to even light touch and small movements.  Per wife during his 8 day hospitalization pt has had poor intake for at least 5 days. Meal completion records do indicate that intake has been poor during admission. Recorded as 0-25% At time of visit wife is offering pt a [employer] (he has ensure ordered but does not like these). He is unable to sit upright to eat and remains mostly in a reclined position.  He has only managed to take a few sips. Pt is lactose intolerant but is willing to try carnation instant breakfast, yogurt, and Boost Breeze. We discussed that if intake does not improve with current treatments may need to consider alternate means of nutrition to prevent malnutrition.  Wife understanding and agreeable to PO interventions.  Per wife pt suffers from longstanding heart burn and takes multiple Tums per day. She also reports that when pain started his heartburn went away.    Medications reviewed and include: SSI, MVI with minerals, senokot-s Labs reviewed:  CBG's: 122-153   Flowsheet Row Most Recent Value  Orbital Region No depletion  Upper Arm Region No depletion  Thoracic and Lumbar Region No depletion  Buccal Region No depletion  Temple Region No depletion  Clavicle Bone Region No depletion  Clavicle and Acromion Bone Region No depletion  Scapular Bone Region Unable to assess  Dorsal Hand No depletion  Patellar Region Mild depletion  Anterior Thigh Region  Mild depletion  Posterior Calf Region Mild depletion  Edema (RD Assessment) None  Hair Reviewed  Eyes Unable to assess  Mouth Unable to assess  Skin Reviewed  Nails Reviewed        Diet Order:   Diet Order             Diet regular Room service appropriate? Yes with Assist; Fluid consistency: Thin  Diet effective now                   EDUCATION NEEDS:   Not appropriate for education at this time  Skin:  Skin  Assessment: Skin Integrity Issues: Skin Integrity Issues:: Diabetic Ulcer Diabetic Ulcer: L big toe  Last BM:  10/5  Height:   Ht Readings from Last 1 Encounters:  07/29/21 _0  (1.803 m)    Weight:   Wt Readings from Last 1 Encounters:  07/29/21 86.2 kg    BMI:  Body mass index is 26.5 kg/m.  Estimated Nutritional Needs:   Kcal:  2100-2300  Protein:  110-130 grams  Fluid:  >2 L/day  Lockie Pares., RD, LDN, CNSC See AMiON for contact information

## 2021-07-30 NOTE — Progress Notes (Signed)
Primary RN aware PICC will be placed in the AM of 07/31/21 .

## 2021-07-30 NOTE — Progress Notes (Signed)
Daily Progress Note   Patient Name: Jose [name]       Date: 07/30/2021 DOB: Jan 10, 1976  Age: 45 y.o. MRN#: [id number] Attending Physician: Oren Binet* Primary Care Physician: [name], NP Admit Date: 07/22/2021  Reason for Consultation/Follow-up: Non pain symptom management and Pain control  Subjective: Chart reviewed.  MAR reveals that he has had a total roughly equivalent to 152 mg of oral morphine through a combination of 25 mcg/h fentanyl patch, 4 doses of 1 mg of IV Dilaudid, and 3 doses of 15 mg of oxycodone.  I saw and examined Jose [name] today.  He was lying in bed in no acute distress at time of my encounter.  Reports his current pain is 4 out of 10.  He states this is fairly well controlled for him.  We discussed his pain regimen including long and short acting medications with fentanyl patch, IV Dilaudid, and oral oxycodone.  Discussed working toward a regimen that he can utilize when he discharges home.  My recommendation was to work to increase fentanyl patch and also increased dose of short acting oral medication.  He was agreeable to this.  We also discussed bowel regimen.  Last bowel movement was 2 days ago.  Continue same for now, but we discussed likely need to increase bowel regimen if no bowel movement by tomorrow.  We also discussed his understanding of results of biopsy and that this is likely osteomyelitis.  He understands need for IV antibiotics at time of discharge.   Length of Stay: 8  Current Medications: Scheduled Meds:  . amLODipine  10 mg Oral Daily  . atorvastatin  40 mg Oral Daily  . collagenase   Topical Daily  . cyclobenzaprine  10 mg Oral BID  . feeding supplement  1 Container Oral TID BM  . fentaNYL  1 patch Transdermal Q72H   . gabapentin  200 mg Oral TID  . heparin  5,000 Units Subcutaneous Q8H  . hydrALAZINE  50 mg Oral Q8H  . insulin aspart  0-9 Units Subcutaneous Q6H  . lactulose  20 g Oral BID  . multivitamin with minerals  1 tablet Oral Daily  . mupirocin ointment  1 application Topical BID  . senna-docusate  1 tablet Oral BID  . traZODone  100 mg Oral QHS  . verapamil  120 mg Oral Daily    Continuous Infusions: .  ceFAZolin (ANCEF) IV 2 g (07/30/21 1417)  . chlorproMAZINE (THORAZINE) IV 25 mg (07/26/21 0854)    PRN Meds: acetaminophen, albuterol, ALPRAZolam, chlorproMAZINE (THORAZINE) IV, hydrALAZINE, HYDROmorphone (DILAUDID) injection, oxyCODONE  Physical Exam         Resting in bed Regular work of breathing No distress Sleepy  Abdomen not distended No edema Has pain and swelling, some warmth to R posterior flank area.   Vital Signs: BP (!) 153/88 (BP Location: Right Arm)   Pulse (!) 114   Temp 98.4 F (36.9 C) (Oral)   Resp 18   Ht 5' 11"  (1.803 m)   Wt 86.2 kg   SpO2 96%   BMI 26.50 kg/m  SpO2: SpO2: 96 % O2 Device: O2 Device: Nasal Cannula O2 Flow Rate: O2 Flow Rate (L/min): 2 L/min  Intake/output summary:  Intake/Output Summary (Last 24 hours) at 07/30/2021 1555 Last data filed at 07/30/2021 1346 Gross per 24 hour  Intake 240 ml  Output 2100 ml  Net -1860 ml    LBM: Last BM Date: 07/28/21 Baseline Weight: Weight: 87.9 kg Most recent weight: Weight: 86.2 kg       Palliative Assessment/Data:      Patient Active Problem List   Diagnosis Date Noted  . Diabetic ulcer of left great toe ([place])   . MSSA bacteremia 07/23/2021  . Constipation 07/23/2021  . Lytic bone lesions on xray 07/22/2021  . AKI (acute kidney injury) ([place]) 05/19/2021  . Anemia 05/19/2021  . Nausea and vomiting 05/19/2021  . Elevated blood pressure reading 05/19/2021  . COVID-19 05/18/2021  . Bilateral carpal tunnel syndrome 01/23/2020  . Numbness 12/12/2019  . Microalbuminuria due to type 2  diabetes mellitus ([place]) 07/20/2019  . GAD (generalized anxiety disorder) 06/18/2019  . Adult ADHD 06/18/2019  . Trigger middle finger of left hand 06/11/2019  . Diabetic retinopathy ([place]) 07/13/2018  . Type 2 diabetes mellitus with diabetic polyneuropathy, without long-term current use of insulin ([place]) 05/17/2018  . Tobacco use disorder 05/17/2018  . Hyperlipidemia 05/17/2018    Palliative Care Assessment & Plan   Patient Profile:    Assessment:  45 y.o. male  with past medical history of type 2 diabetes with retinopathy, neuropathy, microalbuminemia, history of hypertension dyslipidemia stage III chronic kidney disease, COVID-19 infection in July 2022 admitted on 07/22/2021 with chest pain radiating to bilateral shoulder blades for 6 weeks.    Patient presented to the emergency department and was admitted to hospital medicine service.  Patient was found to have CT chest with lytic bone lesions involving T5 and T6 as well as the sternum.  There was concern for pathologic fracture of T5.  Multiple myeloma is in the differential diagnosis.  Found to have MSSA bacteremia. Patient is being followed by medical oncology and infectious disease.  He was also seen by podiatry because of left hallux ulcer.  Patient was seen by interventional radiology and is to underwent bone marrow biopsy on 07-28-2021.  He continues on antibiotics. Palliative consultation for pain management has been requested.  Recommendations/Plan: Pain: I reviewed his usage over the last 24 hours and he has had a total of 4 mg of IV Dilaudid as well as 15 mg of oxycodone while also utilizing a 25 mcg/h fentanyl patch for a total of 152.5 mg oral morphine equivalent.  Plan to increase fentanyl patch to 50 mcg/h and will also increase oral rescue medication to 10 mg of oxycodone every  3 hours as needed.  This may be a good discharge regimen for him.  We will plan to reassess this again tomorrow morning as he is approaching readiness  for discharge from ID standpoint. Appreciate multi disciplinary team approach and support.     Goals of Care and Additional Recommendations: Limitations on Scope of Treatment: Full Scope Treatment  Code Status:    Code Status Orders  (From admission, onward)           Start     Ordered   07/22/21 1845  Full code  Continuous        07/22/21 1844           Code Status History     Date Active Date Inactive Code Status Order ID Comments User Context   05/19/2021 0418 05/21/2021 1847 Full Code [id number]  Tacey Ruiz, MD Inpatient       Prognosis:  Unable to determine  Discharge Planning: To Be Determined  Care plan was discussed with patient, RN, and pharmacy.  Thank you for allowing the Palliative Medicine Team to assist in the care of this patient.   Total Time 40 Prolonged Time Billed  no   Greater than 50%  of this time was spent counseling and coordinating care related to the above assessment and plan.   Micheline Rough, MD  Please contact Palliative Medicine Team phone at [phone] for questions and concerns.

## 2021-07-30 NOTE — Progress Notes (Signed)
Occupational Therapy Treatment Patient Details Name: Jose [name] MRN: [id number] DOB: 1976-06-16 Today's Date: 07/30/2021   History of present illness Patient 45 year old gentleman presented to the ED with complaints of chest pain radiating to bilateral shoulder blades x6 weeks. CT chest without contrast with lytic bone lesions involving T5 and T6 and the sternum, concern for pathologic fracture of T6 with paraspinal hematoma or tumor could represent metastatic bone disease or possibly myeloma. PMH: type 2 diabetes with retinopathy, neuropathy, microalbuminemia, hypertension, hyperlipidemia, chronic kidney disease stage III, COVID-19 infection July 2022   OT comments  Patient having significant pain in chest and bilateral scapula area. Has lidocaine patches on scapulas and has received other pain medication with little improvement. Patient is very pleasant and agreeable to try sitting up to see if it improves pressure on chest. Patient able to tolerate sitting upright for ~8 minutes while performing oral care and incentive spirometer exercises with min cues for technique before returning back to bed. Patient reports best position for alleviating pain right now is semi-supine in bed.    Recommendations for follow up therapy are one component of a multi-disciplinary discharge planning process, led by the attending physician.  Recommendations may be updated based on patient status, additional functional criteria and insurance authorization.    Follow Up Recommendations  Home health OT;Supervision/Assistance - 24 hour (pending progress)    Equipment Recommendations  Other (comment) (TBD)       Precautions / Restrictions Precautions Precautions: Other (comment) Precaution Comments: no formal sternal precautions, but would encourage them based on CT       Mobility Bed Mobility Overal bed mobility: Needs Assistance Bed Mobility: Rolling;Sidelying to Sit;Sit to Sidelying Rolling:  Supervision Sidelying to sit: Min guard     Sit to sidelying: Supervision General bed mobility comments: Supervision to min G assist for safety    Transfers                 General transfer comment: deferred, patient declined trying to get to chair at this time due to pain and feels resting in bed in semi-supine position provides most comfort    Balance Overall balance assessment: Needs assistance Sitting-balance support: Feet supported Sitting balance-Leahy Scale: Good                                     ADL either performed or assessed with clinical judgement   ADL Overall ADL's : Needs assistance/impaired     Grooming: Oral care;Set up;Sitting                                 General ADL Comments: limited out of bed activity tolerance due to significant pain. Tolerates sitting up at edge of bed for ~8 minutes total      Cognition Arousal/Alertness: Awake/alert Behavior During Therapy: WFL for tasks assessed/performed Overall Cognitive Status: Within Functional Limits for tasks assessed                                 General Comments: patient very pleasant and willing to attempt OT despite significant chest and scapular pain        Exercises Exercises: Other exercises Other Exercises Other Exercises: IS x5 with min cues for technique  Pertinent Vitals/ Pain       Pain Assessment: Faces Faces Pain Scale: Hurts whole lot Pain Location: chest and shoulder blades Pain Descriptors / Indicators: Grimacing;Guarding;Constant Pain Intervention(s): Limited activity within patient's tolerance;Premedicated before session         Frequency  Min 2X/week        Progress Toward Goals  OT Goals(current goals can now be found in the care plan section)  Progress towards OT goals: Not progressing toward goals - comment (significant pain)  Acute Rehab OT Goals Patient Stated Goal: decrease pain OT Goal  Formulation: With patient Time For Goal Achievement: 08/09/21 Potential to Achieve Goals: Fair ADL Goals Pt Will Perform Lower Body Dressing: with modified independence;sit to/from stand Pt Will Transfer to Toilet: with modified independence;ambulating;regular height toilet Pt Will Perform Toileting - Clothing Manipulation and hygiene: with modified independence;sit to/from stand Additional ADL Goal #1: Patient will stand at sink to perform grooming task as evidence of improving activity tolerance  Plan Discharge plan remains appropriate       AM-PAC OT "6 Clicks" Daily Activity     Outcome Measure   Help from another person eating meals?: A Little Help from another person taking care of personal grooming?: A Little Help from another person toileting, which includes using toliet, bedpan, or urinal?: A Lot Help from another person bathing (including washing, rinsing, drying)?: A Lot Help from another person to put on and taking off regular upper body clothing?: A Little Help from another person to put on and taking off regular lower body clothing?: A Lot 6 Click Score: 15    End of Session    OT Visit Diagnosis: Unsteadiness on feet (R26.81);Pain Pain - part of body:  (chest, B scapula)   Activity Tolerance Patient limited by pain   Patient Left in bed;with call bell/phone within reach   Nurse Communication Mobility status        Time: [id number] OT Time Calculation (min): 14 min  Charges: OT General Charges $OT Visit: 1 Visit OT Treatments $Self Care/Home Management : 8-22 mins  Delbert Phenix OT OT pager: [phone]   Rosemary Holms 07/30/2021, 11:56 AM

## 2021-07-31 DIAGNOSIS — M898X9 Other specified disorders of bone, unspecified site: Secondary | ICD-10-CM | POA: Diagnosis not present

## 2021-07-31 DIAGNOSIS — Z515 Encounter for palliative care: Secondary | ICD-10-CM | POA: Diagnosis not present

## 2021-07-31 DIAGNOSIS — M899 Disorder of bone, unspecified: Secondary | ICD-10-CM | POA: Diagnosis not present

## 2021-07-31 LAB — BASIC METABOLIC PANEL
Anion gap: 9 (ref 5–15)
BUN: 32 mg/dL — ABNORMAL HIGH (ref 6–20)
CO2: 24 mmol/L (ref 22–32)
Calcium: 9.2 mg/dL (ref 8.9–10.3)
Chloride: 102 mmol/L (ref 98–111)
Creatinine, Ser: 1.73 mg/dL — ABNORMAL HIGH (ref 0.61–1.24)
GFR, Estimated: 48 mL/min — ABNORMAL LOW (ref >60)
Glucose, Bld: 253 mg/dL — ABNORMAL HIGH (ref 70–99)
Potassium: 4.3 mmol/L (ref 3.5–5.1)
Sodium: 135 mmol/L (ref 135–145)

## 2021-07-31 LAB — GLUCOSE, CAPILLARY
Glucose-Capillary: 221 mg/dL — ABNORMAL HIGH (ref 70–99)
Glucose-Capillary: 267 mg/dL — ABNORMAL HIGH (ref 70–99)
Glucose-Capillary: 295 mg/dL — ABNORMAL HIGH (ref 70–99)
Glucose-Capillary: 229 mg/dL — ABNORMAL HIGH (ref 70–99)
Glucose-Capillary: 244 mg/dL — ABNORMAL HIGH (ref 70–99)

## 2021-07-31 LAB — CBC WITH DIFFERENTIAL/PLATELET
Abs Immature Granulocytes: 0.07 10*3/uL (ref 0.00–0.07)
Basophils Absolute: 0 10*3/uL (ref 0.0–0.1)
Basophils Relative: 0 %
Eosinophils Absolute: 0.4 10*3/uL (ref 0.0–0.5)
Eosinophils Relative: 3 %
HCT: 24.7 % — ABNORMAL LOW (ref 39.0–52.0)
Hemoglobin: 8.1 g/dL — ABNORMAL LOW (ref 13.0–17.0)
Immature Granulocytes: 1 %
Lymphocytes Relative: 11 %
Lymphs Abs: 1.4 10*3/uL (ref 0.7–4.0)
MCH: 27.7 pg (ref 26.0–34.0)
MCHC: 32.8 g/dL (ref 30.0–36.0)
MCV: 84.6 fL (ref 80.0–100.0)
Monocytes Absolute: 2 10*3/uL — ABNORMAL HIGH (ref 0.1–1.0)
Monocytes Relative: 15 %
Neutro Abs: 9.3 10*3/uL — ABNORMAL HIGH (ref 1.7–7.7)
Neutrophils Relative %: 70 %
Platelets: 352 10*3/uL (ref 150–400)
RBC: 2.92 MIL/uL — ABNORMAL LOW (ref 4.22–5.81)
RDW: 13.2 % (ref 11.5–15.5)
WBC: 13.3 10*3/uL — ABNORMAL HIGH (ref 4.0–10.5)
nRBC: 0 % (ref 0.0–0.2)

## 2021-07-31 MED ORDER — GLUCERNA SHAKE PO LIQD
237.0000 mL | Freq: Three times a day (TID) | ORAL | Status: DC
  Administered 2021-07-31 – 2021-08-03 (×8): 237 mL via ORAL
  Filled 2021-07-31 (×10): qty 237

## 2021-07-31 MED ORDER — SODIUM CHLORIDE 0.9% FLUSH: 10.0000 mL | INTRAVENOUS | Status: DC | PRN

## 2021-07-31 MED ORDER — CHLORHEXIDINE GLUCONATE CLOTH 2 % EX PADS
6.0000 | MEDICATED_PAD | Freq: Every day | CUTANEOUS | Status: DC
  Administered 2021-07-31 – 2021-08-03 (×4): 6 via TOPICAL

## 2021-07-31 MED ORDER — SODIUM CHLORIDE 0.9% FLUSH
10.0000 mL | Freq: Two times a day (BID) | INTRAVENOUS | Status: DC
Start: 2021-07-31 — End: 2021-08-03
  Administered 2021-07-31 – 2021-08-02 (×5): 10 mL
  Administered 2021-08-02: 20 mL
  Administered 2021-08-03: 10 mL

## 2021-07-31 MED ORDER — PHENOL 1.4 % MT LIQD
1.0000 | OROMUCOSAL | Status: DC | PRN
  Filled 2021-07-31: qty 177

## 2021-07-31 MED ORDER — HYDROMORPHONE HCL 1 MG/ML IJ SOLN
1.0000 mg | INTRAMUSCULAR | Status: DC | PRN
  Administered 2021-07-31 – 2021-08-03 (×2): 1 mg via INTRAVENOUS
  Filled 2021-07-31 (×2): qty 1

## 2021-07-31 MED ORDER — CEFAZOLIN IV (FOR PTA / DISCHARGE USE ONLY): 2.0000 g | Freq: Three times a day (TID) | INTRAVENOUS | 0 refills | Status: DC

## 2021-07-31 MED ORDER — ACETAMINOPHEN 500 MG PO TABS
1000.0000 mg | ORAL_TABLET | Freq: Three times a day (TID) | ORAL | Status: DC
  Administered 2021-07-31 – 2021-08-03 (×9): 1000 mg via ORAL
  Filled 2021-07-31 (×9): qty 2

## 2021-07-31 NOTE — Progress Notes (Addendum)
HEMATOLOGY-ONCOLOGY PROGRESS NOTE  SUBJECTIVE: Biopsy of T5 returned and is consistent with osteomyelitis.  Additionally, SPEP came back negative for M protein, immunofixation was unremarkable.  UPEP showed significant proteinuria, elevated kappa and lambda light chain, with normal ratio.  Results do not support a diagnosis of multiple myeloma.  Biopsy results were discussed with the patient and his wife this morning.  He continues to have pain.  No fevers.  Patient is still having some shortness of breath and remains on oxygen.  REVIEW OF SYSTEMS:   Constitutional: Denies fevers, chills  Eyes: Denies blurriness of vision Ears, nose, mouth, throat, and face: Denies mucositis or sore throat Respiratory: Denies cough, dyspnea or wheezes Cardiovascular: Denies palpitation, chest discomfort Gastrointestinal:  Denies nausea, heartburn or change in bowel habits Skin: Denies abnormal skin rashes Lymphatics: Denies new lymphadenopathy or easy bruising Neurological: Has baseline peripheral neuropathy Behavioral/Psych: Mood is stable, no new changes  Extremities: No lower extremity edema, (+) mid back pain  All other systems were reviewed with the patient and are negative.  I have reviewed the past medical history, past surgical history, social history and family history with the patient and they are unchanged from previous note.   PHYSICAL EXAMINATION: ECOG PERFORMANCE STATUS: 1 - Symptomatic but completely ambulatory  Vitals:   07/31/21 0151 07/31/21 0409  BP: (!) 155/81 (!) 149/82  Pulse: 94 95  Resp: 17 17  Temp: 98.7 F (37.1 C) 98.6 F (37 C)  SpO2: 94% 95%   Filed Weights   07/27/21 0622 07/29/21 0500 07/29/21 1136  Weight: 85.7 kg 85.9 kg 86.2 kg    Intake/Output from previous day: 10/06 0701 - 10/07 0700 In: 720 [P.O.:720] Out: 800 [Urine:800]  GENERAL:alert, no distress and comfortable NEURO: alert & oriented x 3 with fluent speech, no focal motor/sensory  deficits  LABORATORY DATA:  I have reviewed the data as listed CMP Latest Ref Rng & Units 07/31/2021 07/29/2021 07/28/2021  Glucose 70 - 99 mg/dL 253(H) 138(H) 134(H)  BUN 6 - 20 mg/dL 32(H) 28(H) 26(H)  Creatinine 0.61 - 1.24 mg/dL 1.73(H) 1.92(H) 1.93(H)  Sodium 135 - 145 mmol/L 135 138 135  Potassium 3.5 - 5.1 mmol/L 4.3 4.2 4.0  Chloride 98 - 111 mmol/L 102 103 103  CO2 22 - 32 mmol/L 24 23 23   Calcium 8.9 - 10.3 mg/dL 9.2 9.3 9.0  Total Protein 6.5 - 8.1 g/dL - - -  Total Bilirubin 0.3 - 1.2 mg/dL - - -  Alkaline Phos 38 - 126 U/L - - -  AST 15 - 41 U/L - - -  ALT 0 - 44 U/L - - -    Lab Results  Component Value Date   WBC 13.3 (H) 07/31/2021   HGB 8.1 (L) 07/31/2021   HCT 24.7 (L) 07/31/2021   MCV 84.6 07/31/2021   PLT 352 07/31/2021   NEUTROABS 9.3 (H) 07/31/2021    CT ABDOMEN PELVIS WO CONTRAST  Result Date: 07/23/2021 CLINICAL DATA:  Cancer of unknown primary, lytic thoracic spine lesions EXAM: CT ABDOMEN AND PELVIS WITHOUT CONTRAST TECHNIQUE: Multidetector CT imaging of the abdomen and pelvis was performed following the standard protocol without IV contrast. Unenhanced CT was performed per clinician order. Lack of IV contrast limits sensitivity and specificity, especially for evaluation of abdominal/pelvic solid viscera. COMPARISON:  07/22/2021 FINDINGS: Lower chest: Hypoventilatory changes at the dependent lower lobes. No acute pleural or parenchymal lung disease. Hepatobiliary: No gross liver abnormalities on this unenhanced exam. Gallbladder is moderately distended without cholelithiasis  or cholecystitis. Pancreas: Unremarkable. No pancreatic ductal dilatation or surrounding inflammatory changes. Spleen: Normal in size without focal abnormality. Adrenals/Urinary Tract: No urinary tract calculi or obstructive uropathy. Excreted contrast within the bladder consistent with preceding MRI. The adrenals are unremarkable. Stomach/Bowel: No bowel obstruction or ileus. Moderate  retained stool throughout the colon. The appendix is surgically absent. No bowel wall thickening or inflammatory change. Vascular/Lymphatic: Aortic atherosclerosis. No enlarged abdominal or pelvic lymph nodes. Reproductive: Prostate is unremarkable. Other: No free fluid or free gas. Small fat containing bilateral inguinal hernias. No bowel herniation. Musculoskeletal: There are no acute or destructive bony lesions. Reconstructed images demonstrate no additional findings. IMPRESSION: 1. No acute intra-abdominal or intrapelvic process. 2. Moderate fecal retention consistent with constipation. 3.  Aortic Atherosclerosis (ICD10-I70.0). Electronically Signed   By: Randa Ngo M.D.   On: 07/23/2021 22:26   DG Chest 2 View  Result Date: 07/22/2021 CLINICAL DATA:  chest pain EXAM: CHEST - 2 VIEW COMPARISON:  None. FINDINGS: The cardiomediastinal silhouette is within normal limits. No pleural effusion. No pneumothorax. No lobar consolidation. Minimal left base subsegmental atelectasis. No acute osseous abnormality. IMPRESSION: Minimal left lung base subsegmental atelectasis. No other acute abnormality in the chest. Electronically Signed   By: Albin Felling M.D.   On: 07/22/2021 11:05   CT HEAD WO CONTRAST (5MM)  Result Date: 07/27/2021 CLINICAL DATA:  Dizziness EXAM: CT HEAD WITHOUT CONTRAST TECHNIQUE: Contiguous axial images were obtained from the base of the skull through the vertex without intravenous contrast. COMPARISON:  None. FINDINGS: Brain: No evidence of acute infarction, hemorrhage, hydrocephalus, extra-axial collection or mass lesion/mass effect. Vascular: No hyperdense vessel or unexpected calcification. Skull: Normal. Negative for fracture or focal lesion. Sinuses/Orbits: No acute finding. Other: None. IMPRESSION: No acute intracranial abnormality noted. Electronically Signed   By: Inez Catalina M.D.   On: 07/27/2021 20:55   CT Chest Wo Contrast  Result Date: 07/22/2021 CLINICAL DATA:  Chest pain  EXAM: CT CHEST WITHOUT CONTRAST TECHNIQUE: Multidetector CT imaging of the chest was performed following the standard protocol without IV contrast. COMPARISON:  None. FINDINGS: Cardiovascular: The heart is within normal limits in size. No pericardial effusion the aorta is normal in caliber. Minimal scattered atherosclerotic calcifications. Age advanced coronary artery calcifications. Mediastinum/Nodes: Small scattered mediastinal and hilar lymph nodes but no mass or overt adenopathy. Borderline enlarged subcarinal node measuring 12 mm could be related to the thoracic spine process. The esophagus is grossly normal. Lungs/Pleura: Areas of streaky bibasilar atelectasis. No focal airspace consolidation/pneumonia. No worrisome pulmonary lesions. No pleural effusions or pleural lesions. Upper Abdomen: No significant upper abdominal findings. Musculoskeletal: No chest wall mass, supraclavicular or axillary adenopathy. There are several small bilateral thyroid nodules. None of these measures greater than 12 mm. Not clinically significant; no follow-up imaging recommended (ref: J Am Coll Radiol. 2015 Feb;12(2): 143-50). There are lytic destructive bone lesions involving the T5 and T6 vertebral bodies with a mild pathologic compression fracture of T6. Paraspinal hematoma or tumor noted at both these levels. No definite findings for spinal canal compromise. Mild lucency involving the sternum could also reflect lytic bone disease. I do not see any obvious rib lesions or pathologic rib fractures. IMPRESSION: 1. 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 There is a pathologic fracture of T6 with paraspinal hematoma or tumor. This could represent metastatic bone disease or possibly myeloma. 2. No obvious primary neoplasm involving the chest or upper abdomen. PET-CT may be helpful for further evaluation and extent  of the bone disease and possibly detecting a primary neoplasm. 3. Thoracic spine MRI without and  with contrast may be helpful for further evaluation of the thoracic disease. I do not see any any obvious spinal canal compromise. Aortic Atherosclerosis (ICD10-I70.0). Electronically Signed   By: Marijo Sanes M.D.   On: 07/22/2021 16:51   MR THORACIC SPINE W WO CONTRAST  Result Date: 07/23/2021 CLINICAL DATA:  Bone lesion, T-spine, malignancy suspected; Bone lesion, L/S-spine, malignancy suspected EXAM: MRI THORACIC AND LUMBAR SPINE WITHOUT AND WITH CONTRAST TECHNIQUE: Multiplanar and multiecho pulse sequences of the thoracic and lumbar spine were obtained without and with intravenous contrast. CONTRAST:  53m GADAVIST GADOBUTROL 1 MMOL/ML IV SOLN COMPARISON:  None. FINDINGS: MRI THORACIC SPINE FINDINGS Alignment:  Physiologic. Vertebrae: There is abnormal bone marrow signal and contrast enhancement at T4 and T5. There is mild edema of the disc space. There is a minimally depressed fracture of the superior T5 endplate. Cord:  Normal signal and morphology. Paraspinal and other soft tissues: There is phlegmon like contrast enhancement of the prevertebral soft tissues at the T4 and T5 levels. No epidural abscess. Disc levels: There is no spinal canal or neural foraminal stenosis. MRI LUMBAR SPINE FINDINGS Segmentation:  Standard. Alignment:  Physiologic. Vertebrae:  No fracture, evidence of discitis, or bone lesion. Conus medullaris: Extends to the L1 level and appears normal. Paraspinal and other soft tissues: Negative. Disc levels: No spinal canal or neural foraminal stenosis. IMPRESSION: 1. Abnormal bone marrow signal and contrast enhancement at the T4 and T5 levels with mild edema of the disc space and prevertebral phlegmon/hematoma. The findings could indicate osseous metastatic disease, multiple myeloma or discitis-osteomyelitis. 2. Minimally depressed compression fracture of T5 without retropulsion. 3. No epidural abscess. 4. No spinal canal or neural foraminal stenosis. Electronically Signed   By: KUlyses JarredM.D.   On: 07/23/2021 19:55   MR Lumbar Spine W Wo Contrast  Result Date: 07/23/2021 CLINICAL DATA:  Bone lesion, T-spine, malignancy suspected; Bone lesion, L/S-spine, malignancy suspected EXAM: MRI THORACIC AND LUMBAR SPINE WITHOUT AND WITH CONTRAST TECHNIQUE: Multiplanar and multiecho pulse sequences of the thoracic and lumbar spine were obtained without and with intravenous contrast. CONTRAST:  123mGADAVIST GADOBUTROL 1 MMOL/ML IV SOLN COMPARISON:  None. FINDINGS: MRI THORACIC SPINE FINDINGS Alignment:  Physiologic. Vertebrae: There is abnormal bone marrow signal and contrast enhancement at T4 and T5. There is mild edema of the disc space. There is a minimally depressed fracture of the superior T5 endplate. Cord:  Normal signal and morphology. Paraspinal and other soft tissues: There is phlegmon like contrast enhancement of the prevertebral soft tissues at the T4 and T5 levels. No epidural abscess. Disc levels: There is no spinal canal or neural foraminal stenosis. MRI LUMBAR SPINE FINDINGS Segmentation:  Standard. Alignment:  Physiologic. Vertebrae:  No fracture, evidence of discitis, or bone lesion. Conus medullaris: Extends to the L1 level and appears normal. Paraspinal and other soft tissues: Negative. Disc levels: No spinal canal or neural foraminal stenosis. IMPRESSION: 1. Abnormal bone marrow signal and contrast enhancement at the T4 and T5 levels with mild edema of the disc space and prevertebral phlegmon/hematoma. The findings could indicate osseous metastatic disease, multiple myeloma or discitis-osteomyelitis. 2. Minimally depressed compression fracture of T5 without retropulsion. 3. No epidural abscess. 4. No spinal canal or neural foraminal stenosis. Electronically Signed   By: KeUlyses Jarred.D.   On: 07/23/2021 19:55   NM Pulmonary Perfusion  Result Date: 07/22/2021 CLINICAL DATA:  PE  suspected, low/intermediate probability positive D-dimer. EXAM: NUCLEAR MEDICINE PERFUSION LUNG  SCAN TECHNIQUE: Perfusion images were obtained in multiple projections after intravenous injection of radiopharmaceutical. Ventilation scans intentionally deferred if perfusion scan and chest x-ray adequate for interpretation during COVID 19 epidemic. RADIOPHARMACEUTICALS:  4.4 mCi Tc-59mMAA IV COMPARISON:  None. FINDINGS: Normal perfusion.  No focal defects. IMPRESSION: No evidence for PE. Electronically Signed   By: CSan MorelleM.D.   On: 07/22/2021 16:26   IR Fluoro Guide Ndl Plmt / BX  Result Date: 07/28/2021 INDICATION: JTHOMA PAULSENis a 45y.o. male with PMH of DM2, retinopathy, neuropathy, microalbuminuria, HTN, HLD, CKD 3, COVID-19 infection July 2022. Patient presented to ED 07/22/2021 complaining of chest pain that radiated to both shoulder blades for approximately 6 weeks. Chest CT with contrast was significant for lytic bone disease involving T4 and T5 and possibly the sternum with a superior endplate fracture at T5 concerning for malignancy versus osteomyelitis in the setting of MSSA bacteremia. She comes to our service today for a T5 fluoroscopy guided or bone biopsy. EXAM: FLUOROSCOPY GUIDED T8 VERTEBRAL BODY CORE BONE BIOPSY MEDICATIONS: None. ANESTHESIA/SEDATION: Moderate (conscious) sedation was employed during this procedure. A total of Versed 1.5 mg and Fentanyl 100 mcg was administered intravenously. Moderate Sedation Time: 12 minutes. The patient's level of consciousness and vital signs were monitored continuously by radiology nursing throughout the procedure under my direct supervision. FLUOROSCOPY TIME:  Fluoroscopy Time: 7 minutes 18 seconds; DAP (430 mGy). COMPLICATIONS: None immediate. PROCEDURE: Informed written consent was obtained from the patient after a thorough discussion of the procedural risks, benefits and alternatives. All questions were addressed. Maximal Sterile Barrier Technique was utilized including caps, mask, sterile gowns, sterile gloves, sterile drape,  hand hygiene and skin antiseptic. A timeout was performed prior to the initiation of the procedure. The patient was placed in prone position on the angiography table. The thoracic spine region was prepped and draped in a sterile fashion. Under fluoroscopy, the T5 vertebral body was delineated and the skin area was marked. The skin was infiltrated with a 1% Lidocaine approximately 3 cm lateral to the spinous process projection on the left. Using a 22-gauge spinal needle, the soft issue and the peripedicular space and periosteum were infiltrated with Bupivacaine 0.5%. A skin incision was made at the access site. Subsequently, an 11-gauge Kyphon trocar was inserted under fluoroscopic guidance until contact with the pedicle was obtained. The trocar was inserted under light hammer tapping into the pedicle until the posterior boundaries of the vertebral body was reached. The diamond mandrill was removed and 2 core biopsies wereobtained. The trocar was later removed. IMPRESSION: Successful fluoroscopy-guided left transpedicular approach for T5 vertebral body core bone biopsy . Bone samples obtained were sent for tissue exam. Electronically Signed   By: KPedro EarlsM.D.   On: 07/28/2021 12:13   DG CHEST PORT 1 VIEW  Result Date: 07/27/2021 CLINICAL DATA:  Chest pain over the last 6 weeks. EXAM: PORTABLE CHEST 1 VIEW COMPARISON:  07/22/2021 FINDINGS: Heart size is normal. The patient has developed patchy infiltrates at both lung bases consistent with bibasilar pneumonia. The upper lungs are clear. No visible effusion. No abnormal bone finding. IMPRESSION: Bilateral lower lobe pneumonia. Electronically Signed   By: MNelson ChimesM.D.   On: 07/27/2021 12:27   ECHOCARDIOGRAM COMPLETE  Result Date: 07/24/2021    ECHOCARDIOGRAM REPORT   Patient Name:   JNAVEEN CLARDYDate of Exam: 07/24/2021 Medical Rec #:  [id number]  Height:       71.0 in Accession #:    [id number]      Weight:       193.8 lb Date  of Birth:  03/22/1976       BSA:          2.081 m Patient Age:    45 years        BP:           139/81 mmHg Patient Gender: M               HR:           111 bpm. Exam Location:  Inpatient Procedure: 2D Echo, Color Doppler and Cardiac Doppler Indications:    Bacteremia  History:        Patient has no prior history of Echocardiogram examinations.                 Risk Factors:Diabetes and Dyslipidemia.  Sonographer:    Raquel Sarna Senior RDCS Referring Phys: [id number] Malachy Moan THOMPSON  Sonographer Comments: Chest wall very tender to the touch, exam performed with minimal probe pressure for patient comfort. Wall motion obtained from short axis. IMPRESSIONS  1. Left ventricular ejection fraction, by estimation, is >75%. The left ventricle has hyperdynamic function. The left ventricle has no regional wall motion abnormalities. Left ventricular diastolic parameters are indeterminate.  2. Right ventricular systolic function is normal. The right ventricular size is normal. There is normal pulmonary artery systolic pressure. The estimated right ventricular systolic pressure is 46.9 mmHg.  3. The mitral valve is grossly normal. No evidence of mitral valve regurgitation.  4. The aortic valve is tricuspid. Aortic valve regurgitation is not visualized.  5. The inferior vena cava is normal in size with <50% respiratory variability, suggesting right atrial pressure of 8 mmHg. Comparison(s): No prior Echocardiogram. Conclusion(s)/Recommendation(s): No evidence of valvular vegetations on this transthoracic echocardiogram, however the right-sided valves are poorly visualized. Would recommend a transesophageal echocardiogram to exclude infective endocarditis if clinically indicated. FINDINGS  Left Ventricle: Left ventricular ejection fraction, by estimation, is >75%. The left ventricle has hyperdynamic function. The left ventricle has no regional wall motion abnormalities. The left ventricular internal cavity size was small. There is no left   ventricular hypertrophy. Left ventricular diastolic parameters are indeterminate. Right Ventricle: The right ventricular size is normal. No increase in right ventricular wall thickness. Right ventricular systolic function is normal. There is normal pulmonary artery systolic pressure. The tricuspid regurgitant velocity is 2.59 m/s, and  with an assumed right atrial pressure of 8 mmHg, the estimated right ventricular systolic pressure is 62.9 mmHg. Left Atrium: Left atrial size was normal in size. Right Atrium: Right atrial size was normal in size. Pericardium: There is no evidence of pericardial effusion. Mitral Valve: The mitral valve is grossly normal. No evidence of mitral valve regurgitation. Tricuspid Valve: The tricuspid valve is grossly normal. Tricuspid valve regurgitation is trivial. Aortic Valve: The aortic valve is tricuspid. Aortic valve regurgitation is not visualized. Pulmonic Valve: The pulmonic valve was normal in structure. Pulmonic valve regurgitation is not visualized. Aorta: The aortic root and ascending aorta are structurally normal, with no evidence of dilitation. Venous: The inferior vena cava is normal in size with less than 50% respiratory variability, suggesting right atrial pressure of 8 mmHg. IAS/Shunts: No atrial level shunt detected by color flow Doppler.  LEFT VENTRICLE PLAX 2D LVIDd:         3.70 cm LVIDs:  2.15 cm LV PW:         1.15 cm LV IVS:        0.90 cm LVOT diam:     1.90 cm LV SV:         56 LV SV Index:   27 LVOT Area:     2.84 cm  RIGHT VENTRICLE RV S prime:     27.80 cm/s TAPSE (M-mode): 3.6 cm LEFT ATRIUM           Index LA diam:      3.25 cm 1.56 cm/m LA Vol (A4C): 28.3 ml 13.60 ml/m  AORTIC VALVE LVOT Vmax:   123.00 cm/s LVOT Vmean:  95.500 cm/s LVOT VTI:    0.197 m  AORTA Ao Root diam: 3.70 cm TRICUSPID VALVE TR Peak grad:   26.8 mmHg TR Vmax:        259.00 cm/s  SHUNTS Systemic VTI:  0.20 m Systemic Diam: 1.90 cm Lyman Bishop MD Electronically signed by  Lyman Bishop MD Signature Date/Time: 07/24/2021/1:53:34 PM    Final    ECHO TEE  Result Date: 07/29/2021    TRANSESOPHOGEAL ECHO REPORT   Patient Name:   DARRIEN BELTER Date of Exam: 07/29/2021 Medical Rec #:  [id number]       Height:       71.0 in Accession #:    [id number]      Weight:       190.0 lb Date of Birth:  Feb 14, 1976       BSA:          2.063 m Patient Age:    81 years        BP:           148/71 mmHg Patient Gender: M               HR:           103 bpm. Exam Location:  Inpatient Procedure: Transesophageal Echo, Color Doppler and Saline Contrast Bubble Study Indications:    Bacteremia  History:        Patient has prior history of Echocardiogram examinations, most                 recent 07/24/2021.  Sonographer:    Philipp Deputy RDCS Referring Phys: [id number] Margie Billet PROCEDURE: After discussion of the risks and benefits of a TEE, an informed consent was obtained from the patient. The transesophogeal probe was passed without difficulty through the esophogus of the patient. Imaged were obtained with the patient in a left lateral decubitus position. Sedation performed by different physician. The patient was monitored while under deep sedation. Anesthestetic sedation was provided intravenously by Anesthesiology: 2104m of Propofol. Image quality was adequate. The patient's vital signs; including heart rate, blood pressure, and oxygen saturation; remained stable throughout the procedure. The patient developed no complications during the procedure. IMPRESSIONS  1. Left ventricular ejection fraction, by estimation, is 60 to 65%. The left ventricle has normal function. The left ventricle has no regional wall motion abnormalities.  2. Right ventricular systolic function is normal. The right ventricular size is normal.  3. No left atrial/left atrial appendage thrombus was detected.  4. The mitral valve is normal in structure. Trivial mitral valve regurgitation. No evidence of mitral stenosis.  5. The aortic  valve is normal in structure. Aortic valve regurgitation is not visualized. No aortic stenosis is present.  6. The inferior vena cava is normal in size with greater than 50% respiratory variability,  suggesting right atrial pressure of 3 mmHg.  7. Evidence of atrial level shunting detected by color flow Doppler. Agitated saline contrast bubble study was positive with shunting observed within 3-6 cardiac cycles suggestive of interatrial shunt. There is a small patent foramen ovale with bidirectional shunting across atrial septum. 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 FINDINGS  Left Ventricle: Left ventricular ejection fraction, by estimation, is 60 to 65%. The left ventricle has normal function. The left ventricle has no regional wall motion abnormalities. The left ventricular internal cavity size was normal in size. There is  no left ventricular hypertrophy. Right Ventricle: The right ventricular size is normal. No increase in right ventricular wall thickness. Right ventricular systolic function is normal. Left Atrium: Left atrial size was normal in size. No left atrial/left atrial appendage thrombus was detected. Right Atrium: Right atrial size was normal in size. Pericardium: There is no evidence of pericardial effusion. Mitral Valve: The mitral valve is normal in structure. Trivial mitral valve regurgitation. No evidence of mitral valve stenosis. Tricuspid Valve: The tricuspid valve is normal in structure. Tricuspid valve regurgitation is not demonstrated. No evidence of tricuspid stenosis. Aortic Valve: The aortic valve is normal in structure. Aortic valve regurgitation is not visualized. No aortic stenosis is present. Pulmonic Valve: The pulmonic valve was normal in structure. Pulmonic valve regurgitation is not visualized. No evidence of pulmonic stenosis. Aorta: The aortic root is normal in size and structure. Venous: The  inferior vena cava is normal in size with greater than 50% respiratory variability, suggesting right atrial pressure of 3 mmHg. IAS/Shunts: Evidence of atrial level shunting detected by color flow Doppler. Agitated saline contrast was given intravenously to evaluate for intracardiac shunting. Agitated saline contrast bubble study was positive with shunting observed within 3-6 cardiac cycles suggestive of interatrial shunt. A small patent foramen ovale is detected with bidirectional shunting across atrial septum. Candee Furbish MD Electronically signed by Candee Furbish MD Signature Date/Time: 07/29/2021/2:53:55 PM    Final    [place] EKG SITE RITE  Result Date: 07/30/2021 If Site Rite image not attached, placement could not be confirmed due to current cardiac rhythm.   PATHOLOGY: SURGICAL PATHOLOGY  CASE: [id number]  PATIENT: Jose [name]  Surgical Pathology Report   Clinical History: Abnormal signal at the T5 vertebral body, malignancy  vs osteomyelitis.   FINAL MICROSCOPIC DIAGNOSIS:   A. BONE, T5, BIOPSY:  -  Fragments of bone with acute and chronic inflammation consistent with  osteomyelitis   GROSS DESCRIPTION:   Received in formalin is a 1 x 0.2 cm core of tan bone and fragments of  dark red clotted blood measuring 0.8 x 0.8 x 0.3 cm in aggregate.  The  specimen is submitted in toto following decalcification (Immunocal).  [facility] 07/29/2021)    Final Diagnosis performed by Thressa Sheller, MD.   Electronically signed  07/30/2021   ASSESSMENT AND PLAN: 1.  T5, T6 lytic bone lesions, biopsy consistent with osteomyelitis -SPEP negative for M spike -Immunofixation unremarkable -UPEP showed significant proteinuria -Elevated kappa and lambda light chain with normal ratio -Findings not consistent with myeloma 2.  MSSA bacteremia 3.  Normocytic anemia 4.  Leukocytosis 5.  AKI on CKD, improved 6.  Diabetes mellitus with retinopathy, neuropathy, and microalbuminuria 7.  Hypertension 8.   Hyperlipidemia  -Discussed biopsy results with the patient and his wife.  Biopsy consistent with osteomyelitis.  He is being followed by ID and on IV antibiotics.  Further management of osteomyelitis per ID  and primary team. -his SPEP came back negative for M protein, immunofixation was unremarkable.  UPEP showed significant proteinuria, elevated kappa and lambda light chain, with normal ratio.  The above results do not support multiple myeloma, also nonsecretory plasmacytoma is not completely ruled out. -Continues to have anemia and will defer management of anemia to primary team. -At this time, oncology will sign off.  Please reconsult for questions.  Future Appointments  Date Time Provider [place]  08/07/2021  2:45 PM Felipa Furnace, [place] [facility]  08/11/2021  8:45 AM Renato Shin, MD [facility] None  08/17/2021  3:45 PM Vu, Rockey Situ, MD [facility]      LOS: 9 days   Mikey Bussing  07/31/2021   Addendum  I have seen the patient, examined him. I agree with the assessment and and plan and have edited the notes.   I reviewed his recent lab and biopsy results, and discussed with patient.  Fortunately, biopsy was negative for malignancy.  It is consistent with osteomyelitis.  We also discussed that his anemia is likely related to his infection, this will be managed by supportive care, blood transfusion as needed.  I anticipate this will much improved after his infection clears.  He will follow-up with his PCP for this.  I do not plan to see him back in the office, he knows to call me if he needs to see me in future.   Truitt Merle  07/31/2021

## 2021-07-31 NOTE — Progress Notes (Addendum)
Physical Therapy Treatment Patient Details Name: DELRICO SHENKMAN MRN: [id number] DOB: December 05, 1975 Today's Date: 07/31/2021   History of Present Illness Patient 45 year old gentleman presented to the ED with complaints of chest pain radiating to bilateral shoulder blades x6 weeks. CT chest without contrast with lytic bone lesions involving T5 and T6 and the sternum, concern for pathologic fracture of T6 with paraspinal hematoma or tumor could represent metastatic bone disease or possibly myeloma. PMH: type 2 diabetes with retinopathy, neuropathy, microalbuminemia, hypertension, hyperlipidemia, chronic kidney disease stage III, COVID-19 infection July 2022    PT Comments    Pt in bed on 2 lts sats 97%.  Assisted OOB to amb pt did well.General bed mobility comments: self able with increased time and use of rail.  General transfer comment: self able with increased time and good use of hands to steady self.  General Gait Details: tolerated distance well despite pain "between shoulder blades".  Pt also up and to bathroom tice earlier today (reported RN)  Amb on RA per spouse request to see if he needed to cont wearing oxygen.  Lowest RA sat was 90%.  No dyspnea. SATURATION QUALIFICATIONS: (This note is used to comply with regulatory documentation for home oxygen)  Patient Saturations on Room Air at Rest = 94%  Patient Saturations on Room Air while Ambulating  180 feet = 90% with HR 96   Please briefly explain why patient needs home oxygen:  Pt does NOT require supplemental oxygen during activity.    Pt plans to D/C to home.  Pt would benefit from a hospital bed due to  [place].    Recommendations for follow up therapy are one component of a multi-disciplinary discharge planning process, led by the attending physician.  Recommendations may be updated based on patient status, additional functional criteria and insurance authorization.  Follow Up Recommendations  Outpatient PT (may no longer  need (pending pt decision))     Equipment Recommendations  Rolling walker with 5" wheels;3in1 (PT);Hospital bed    Recommendations for Other Services       Precautions / Restrictions Precautions Precautions: Fall Precaution Comments: no formal sternal precautions, but would encourage them based on CT     Mobility  Bed Mobility Overal bed mobility: Modified Independent             General bed mobility comments: self able with increased time and use of rail    Transfers Overall transfer level: Needs assistance Equipment used: Rolling walker (2 wheeled) Transfers: Sit to/from [facility] Sit to Stand: Modified independent (Device/Increase time) Stand pivot transfers: Modified independent (Device/Increase time)       General transfer comment: self able with increased time and good use of hands to steady self  Ambulation/Gait Ambulation/Gait assistance: Supervision Gait Distance (Feet): 180 Feet Assistive device: Rolling walker (2 wheeled) Gait Pattern/deviations: Step-through pattern Gait velocity: decreased   General Gait Details: tolerated distance well despite pain "between shoulder blades".  Pt also up and to bathroom tice earlier today (reported RN)  Amb on RA per spouse request to see if he needed to cont wearing oxygen.  Lowest RA sat was 90%.  No dyspnea.   Stairs             Wheelchair Mobility    Modified Rankin (Stroke Patients Only)       Balance  Cognition Arousal/Alertness: Awake/alert Behavior During Therapy: WFL for tasks assessed/performed Overall Cognitive Status: Within Functional Limits for tasks assessed                                 General Comments: patient very pleasant and willing to attempt despite significant chest and scapular pain      Exercises      General Comments        Pertinent Vitals/Pain Pain Assessment:  Faces Faces Pain Scale: Hurts even more Pain Location: chest and shoulder blades (lesions) Pain Descriptors / Indicators: Grimacing;Guarding;Constant Pain Intervention(s): Monitored during session;Repositioned;Premedicated before session    Home Living                      Prior Function            PT Goals (current goals can now be found in the care plan section) Progress towards PT goals: Progressing toward goals    Frequency    Min 3X/week      PT Plan Current plan remains appropriate    Co-evaluation              AM-PAC PT "6 Clicks" Mobility   Outcome Measure  Help needed turning from your back to your side while in a flat bed without using bedrails?: None Help needed moving from lying on your back to sitting on the side of a flat bed without using bedrails?: None Help needed moving to and from a bed to a chair (including a wheelchair)?: None Help needed standing up from a chair using your arms (e.g., wheelchair or bedside chair)?: A Little Help needed to walk in hospital room?: A Little Help needed climbing 3-5 steps with a railing? : A Little 6 Click Score: 21    End of Session Equipment Utilized During Treatment: Gait belt Activity Tolerance: Patient tolerated treatment well Patient left: in chair;with call bell/phone within reach;with family/visitor present Nurse Communication: Mobility status PT Visit Diagnosis: Pain;Other abnormalities of gait and mobility (R26.89)     Time: [id number] PT Time Calculation (min) (ACUTE ONLY): 23 min  Charges:  $Gait Training: 23-37 mins                     Rica Koyanagi  PTA Acute  Rehabilitation Services Pager      [phone] Office      [phone]

## 2021-07-31 NOTE — Progress Notes (Signed)
Inpatient Diabetes Program Recommendations  AACE/ADA: New Consensus Statement on Inpatient Glycemic Control (2015)  Target Ranges:  Prepandial:   less than 140 mg/dL      Peak postprandial:   less than 180 mg/dL (1-2 hours)      Critically ill patients:  140 - 180 mg/dL   Lab Results  Component Value Date   GLUCAP 229 (H) 07/31/2021   HGBA1C 7.5 (A) 06/11/2021    Review of Glycemic Control Results for Jose [name], Jose [name] (MRN [id number]) as of 07/31/2021 10:12  Ref. Range 07/30/2021 07:38 07/30/2021 13:42 07/30/2021 20:12 07/31/2021 01:53 07/31/2021 07:27  Glucose-Capillary Latest Ref Range: 70 - 99 mg/dL 129 (H) 221 (H) 267 (H) 295 (H) 229 (H)   Diabetes history: DM 2 Outpatient Diabetes medications: Trulicity 3 mg weekly, Amaryl 1 mg Daily, Metformin 500 mg Daily Current orders for Inpatient glycemic control:  Novolog 0-9 units Q6 hours  A1c 7.5% on 8/18 Renal function improving  PO intake inconsistent however pt on Boost supplementation leading to a jump in glucose trends  Inpatient Diabetes Program Recommendations:    - Consult dietitian for lower carb option of supplements - may need Meal/supplement coverage with Novolog tid  Thanks,  Tama Headings RN, MSN, BC-ADM Inpatient Diabetes Coordinator Team Pager [phone] (8a-5p)

## 2021-07-31 NOTE — Progress Notes (Signed)
Daily Progress Note   Patient Name: Jose [name]       Date: 07/31/2021 DOB: [birth year]  Age: 45 y.o. MRN#: [id number] Attending Physician: Cristal Deer, MD Primary Care Physician: [name], NP Admit Date: 07/22/2021  Reason for Consultation/Follow-up: Non pain symptom management and Pain control  Subjective: Chart reviewed.  MAR reveals that in the last 24 hours he has used 3 doses of 1 mg of IV Dilaudid, and 1 dose of 10 mg of oxycodone.  We did bump his fentanyl patch to 50 mcg/h yesterday.  He last had rescue medication of oxycodone 10 mg a little over an hour ago. Reports his current pain is 4 out of 10 which is good for him.    His wife is also at the bedside today and we discussed continuing to work toward a regimen with which she can discharge home with adequate pain control.  We briefly reviewed his current regimen and discussed addition of scheduled Tylenol.  We also discussed continuing with current regimen today but changing indication of Dilaudid to a second line pain medication to be used 45 minutes after oral medication if oral medication is not sufficient to relieve his pain.  He is going to focus on using only oral rescue medication today to see if he can maintain on this regimen in anticipation of discharge home.  We also discussed bowel regimen.  Last bowel movement was yesterday.  We will continue current regimen but monitor closely and adjust bowel regimen as needed.  Reviewed clinical course and concerns of his wife.  She has multiple issues that she wants to ensure addressed prior to discharging home.  I outlined concerns that she noted below.  Length of Stay: 9  Current Medications: Scheduled Meds:  . amLODipine  10 mg Oral Daily  . atorvastatin  40 mg  Oral Daily  . collagenase   Topical Daily  . cyclobenzaprine  10 mg Oral BID  . feeding supplement  1 Container Oral TID BM  . fentaNYL  1 patch Transdermal Q72H  . gabapentin  200 mg Oral TID  . heparin  5,000 Units Subcutaneous Q8H  . hydrALAZINE  50 mg Oral Q8H  . insulin aspart  0-9 Units Subcutaneous Q6H  . lactulose  20 g Oral BID  . multivitamin with minerals  1  tablet Oral Daily  . mupirocin ointment  1 application Topical BID  . senna-docusate  1 tablet Oral BID  . traZODone  100 mg Oral QHS  . verapamil  120 mg Oral Daily    Continuous Infusions: .  ceFAZolin (ANCEF) IV 2 g (07/31/21 0518)  . chlorproMAZINE (THORAZINE) IV 25 mg (07/31/21 0819)    PRN Meds: acetaminophen, albuterol, ALPRAZolam, chlorproMAZINE (THORAZINE) IV, hydrALAZINE, HYDROmorphone (DILAUDID) injection, oxyCODONE, phenol  Physical Exam         Resting in bed Regular work of breathing No distress More awake and interactive  Abdomen not distended No edema Has pain and swelling, some warmth to R posterior flank area.   Vital Signs: BP (!) 149/82 (BP Location: Left Arm)   Pulse 95   Temp 98.6 F (37 C) (Oral)   Resp 17   Ht _0  (1.803 m)   Wt 86.2 kg   SpO2 95%   BMI 26.50 kg/m  SpO2: SpO2: 95 % O2 Device: O2 Device: Nasal Cannula O2 Flow Rate: O2 Flow Rate (L/min): 2 L/min  Intake/output summary:  Intake/Output Summary (Last 24 hours) at 07/31/2021 0957 Last data filed at 07/30/2021 1919 Gross per 24 hour  Intake 720 ml  Output 800 ml  Net -80 ml    LBM: Last BM Date: 07/28/21 Baseline Weight: Weight: 87.9 kg Most recent weight: Weight: 86.2 kg       Palliative Assessment/Data:      Patient Active Problem List   Diagnosis Date Noted  . Diabetic ulcer of left great toe ([place])   . MSSA bacteremia 07/23/2021  . Constipation 07/23/2021  . Lytic bone lesions on xray 07/22/2021  . AKI (acute kidney injury) ([place]) 05/19/2021  . Anemia 05/19/2021  . Nausea and vomiting  05/19/2021  . Elevated blood pressure reading 05/19/2021  . COVID-19 05/18/2021  . Bilateral carpal tunnel syndrome 01/23/2020  . Numbness 12/12/2019  . Microalbuminuria due to type 2 diabetes mellitus ([place]) 07/20/2019  . GAD (generalized anxiety disorder) 06/18/2019  . Adult ADHD 06/18/2019  . Trigger middle finger of left hand 06/11/2019  . Diabetic retinopathy ([place]) 07/13/2018  . Type 2 diabetes mellitus with diabetic polyneuropathy, without long-term current use of insulin ([place]) 05/17/2018  . Tobacco use disorder 05/17/2018  . Hyperlipidemia 05/17/2018    Palliative Care Assessment & Plan   Patient Profile:    Assessment:  45 y.o. male  with past medical history of type 2 diabetes with retinopathy, neuropathy, microalbuminemia, history of hypertension dyslipidemia stage III chronic kidney disease, COVID-19 infection in July 2022 admitted on 07/22/2021 with chest pain radiating to bilateral shoulder blades for 6 weeks.    Patient presented to the emergency department and was admitted to hospital medicine service.  Patient was found to have CT chest with lytic bone lesions involving T5 and T6 as well as the sternum.  There was concern for pathologic fracture of T5.  Multiple myeloma is in the differential diagnosis.  Found to have MSSA bacteremia. Patient is being followed by medical oncology and infectious disease.  He was also seen by podiatry because of left hallux ulcer.  Patient was seen by interventional radiology and is to underwent bone marrow biopsy on 07-28-2021.  He continues on antibiotics. Palliative consultation for pain management has been requested.  Recommendations/Plan: Pain: Jose [name] is more interactive and appears to be more relaxed and comfortable today.  Continue fentanyl patch at 50 mcg/h.  He had oral oxycodone a little over an hour ago  and reports good relief of breakthrough pain.  We discussed plan to continue utilizing oral medications today to ensure that  this is a regimen that will adequately manage his pain at home.  I did leave a backup dose of IV Dilaudid to be used 45 minutes after oral medication if oral medication is not sufficient to relieve his pain.  We also made addition of scheduled Tylenol today. Constipation, opioid related: Bowel movement yesterday.  Continue bowel regimen. Appreciate multi disciplinary team approach and support.   His wife had multiple concerns that she wants to ensure addressed adequately prior to discharge.  I told her I would outline these in my note to ensure they are shared with the rest of the care team.  These include: Ensuring adequate pain regimen (palliative team will continue working to manage this) Having O2 study completed to see if he requires home oxygen at time of discharge Ensuring that all needed equipment, including bed, is delivered prior to consideration for discharge Ensuring that his buttock area/area of pilonidal cyst is evaluated and treated appropriately Ensuring follow-up is in place for care of foot wound.  He has follow with podiatry as an outpatient. Understanding what plan is regarding long-term care for T5/T6 lesion.  I discussed that my understanding was plan for continued antibiotics and if there remains area of fracture with pain, there may be consideration for kyphoplasty at some point in the future, but this will be several weeks/months from now once infection is cleared.  Goals of Care and Additional Recommendations: Limitations on Scope of Treatment: Full Scope Treatment  Code Status:    Code Status Orders  (From admission, onward)           Start     Ordered   07/22/21 1845  Full code  Continuous        07/22/21 1844           Code Status History     Date Active Date Inactive Code Status Order ID Comments User Context   05/19/2021 0418 05/21/2021 1847 Full Code [id number]  Tacey Ruiz, MD Inpatient       Prognosis:  Unable to determine  Discharge  Planning: To Be Determined  Care plan was discussed with patient, RN, case management and pharmacy.  Thank you for allowing the Palliative Medicine Team to assist in the care of this patient.   Total Time 35 Prolonged Time Billed No   Greater than 50%  of this time was spent counseling and coordinating care related to the above assessment and plan.  Micheline Rough, MD  Please contact Palliative Medicine Team phone at [phone] for questions and concerns.

## 2021-07-31 NOTE — Progress Notes (Addendum)
PROGRESS NOTE  Jose [name] XKG:[id number] DOB: 27-Jun-1976 DOA: 07/22/2021 PCP: [name], NP  HPI/Recap of past 24 hours:  This is a 45 year old gentleman with history of diabetes, with retinopathy, with neuropathy, microalbuminuria, hyper tension, hyperlipidemia, chronic kidney disease stage III, COVID-19 infection in July 2022, patient presented to the emergency department for evaluation of abnormal curvature of the spine with elevated blood pressure he was also found to be anemic with hemoglobin of 7.5 D-dimer was elevated but VQ scan was negative for PE and he was found to be having leukocytosis.  CT scan of the chest without contrast revealed lytic bone lesion involving T5 and T6 On there was concern for pathologic fracture of T6 with paraspinal hematoma or tumor or metastatic bone disease oncology was consulted and he was recommended to work-up for multiple myeloma patient was having lots of pain and pain management was started He was also found to have MSSA bacteremia and infectious disease was consulted.  He underwent bone marrow biopsy of T5 which returned to be consistent with osteomyelitis so the results of his work-up did not support diagnosis of multiple myeloma.  Oncology has signed off.  Subjective: July 31, 2021: Patient seen and examined at bedside.  He stated that he is doing much better  Assessment/Plan: Principal Problem:   Lytic bone lesions on xray Active Problems:   Type 2 diabetes mellitus with diabetic polyneuropathy, without long-term current use of insulin (HCC)   Hyperlipidemia   GAD (generalized anxiety disorder)   AKI (acute kidney injury) ([place])   Anemia   MSSA bacteremia   Constipation   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)  1.-Lytic bone lesion in the setting of MSSA bacteremia CT chest with lytic bone disease involving T5 and T6 and possibly the sternum, pathologic fracture of T6 with paraspinal hematoma or tumor, findings suggestive of  metastatic bone disease or possibly myeloma. 2.  Back pain palliative care involvement for pain management appreciated Continue Xanax Flexeril Tylenol and Neurontin  3.  Acute on chronic kidney disease stage III A Holding ARB, Holding Lasix Creatinine has been stable  4.  Volume overload continue Lasix  5.  Type 2 diabetes mellitus continue current management  6.  Hypertension Uncontrolled amlodipine was increased to 10 mg  Code Status: Full  Severity of Illness: The appropriate patient status for this patient is INPATIENT. Inpatient status is judged to be reasonable and necessary in order to provide the required intensity of service to ensure the patient's safety. The patient's presenting symptoms, physical exam findings, and initial radiographic and laboratory data in the context of their chronic comorbidities is felt to place them at high risk for further clinical deterioration. Furthermore, it is not anticipated that the patient will be medically stable for discharge from the hospital within 2 midnights of admission. The following factors support the patient status of inpatient.   " Not ready for discharge  * I certify that at the point of admission it is my clinical judgment that the patient will require inpatient hospital care spanning beyond 2 midnights from the point of admission due to high intensity of service, high risk for further deterioration and high frequency of surveillance required.*   Family Communication: None at bedside  Disposition Plan: To be determined Status is: Inpatient   Dispo: The patient is from: Home              Anticipated d/c is to:  Anticipated d/c date is:               Patient currently not medically stable for discharge  Consultants: Oncology Palliative care  Procedures: TEE TTE Antimicrobials: Cefazolin  DVT prophylaxis: Subcu heparin   Objective: Vitals:   07/30/21 1737 07/30/21 2015 07/31/21 0151 07/31/21 0409   BP: (!) 158/93 (!) 164/92 (!) 155/81 (!) 149/82  Pulse: (!) 104 (!) 111 94 95  Resp:   17 17  Temp: 98.6 F (37 C) 99.3 F (37.4 C) 98.7 F (37.1 C) 98.6 F (37 C)  TempSrc: Oral Oral Oral Oral  SpO2: 94% 92% 94% 95%  Weight:      Height:        Intake/Output Summary (Last 24 hours) at 07/31/2021 0848 Last data filed at 07/30/2021 1919 Gross per 24 hour  Intake 720 ml  Output 800 ml  Net -80 ml   Filed Weights   07/27/21 0622 07/29/21 0500 07/29/21 1136  Weight: 85.7 kg 85.9 kg 86.2 kg   Body mass index is 26.5 kg/m.  Exam:  General: 45 y.o. year-old male well developed well nourished in no acute distress.  Alert and oriented x3. Cardiovascular: Regular rate and rhythm with no rubs or gallops.  No thyromegaly or JVD noted.   Respiratory: Clear to auscultation with no wheezes or rales. Good inspiratory effort. Abdomen: Soft nontender nondistended with normal bowel sounds x4 quadrants. Musculoskeletal: No lower extremity edema. 2/4 pulses in all 4 extremities. Skin: No ulcerative lesions noted or rashes, Psychiatry: Mood is appropriate for condition and setting Neurology:    Data Reviewed: CBC: Recent Labs  Lab 07/25/21 0611 07/25/21 1824 07/26/21 0735 07/27/21 0502 07/28/21 0520 07/31/21 0531  WBC 12.9*  --  13.7* 12.8* 12.5* 13.3*  NEUTROABS 9.4*  --  10.4* 9.6* 9.3* 9.3*  HGB 7.1* 8.6* 8.1* 7.8* 8.4* 8.1*  HCT 21.5* 26.0* 24.1* 23.5* 25.2* 24.7*  MCV 85.0  --  84.9 84.8 84.0 84.6  PLT 321  --  310 291 358 712   Basic Metabolic Panel: Recent Labs  Lab 07/26/21 0735 07/27/21 0502 07/28/21 0520 07/29/21 0517 07/31/21 0531  NA 137 137 135 138 135  K 4.4 4.7 4.0 4.2 4.3  CL 103 104 103 103 102  CO2 24 20* _0 GLUCOSE 222* 158* 134* 138* 253*  BUN 25* 27* 26* 28* 32*  CREATININE 2.02* 2.09* 1.93* 1.92* 1.73*  CALCIUM 9.3 9.1 9.0 9.3 9.2  PHOS 4.0  --   --  5.4*  --    GFR: Estimated Creatinine Clearance: 56.2 mL/min (A) (by C-G formula  based on SCr of 1.73 mg/dL (H)). Liver Function Tests: Recent Labs  Lab 07/26/21 0735 07/29/21 0517  ALBUMIN 2.4* 2.3*   No results for input(s): LIPASE, AMYLASE in the last 168 hours. No results for input(s): AMMONIA in the last 168 hours. Coagulation Profile: Recent Labs  Lab 07/27/21 1749  INR 1.2   Cardiac Enzymes: No results for input(s): CKTOTAL, CKMB, CKMBINDEX, TROPONINI in the last 168 hours. BNP (last 3 results) No results for input(s): PROBNP in the last 8760 hours. HbA1C: No results for input(s): HGBA1C in the last 72 hours. CBG: Recent Labs  Lab 07/29/21 0941 07/29/21 1440 07/29/21 2046 07/30/21 0159 07/30/21 0738  GLUCAP 130* 122* 153* 133* 129*   Lipid Profile: No results for input(s): CHOL, HDL, LDLCALC, TRIG, CHOLHDL, LDLDIRECT in the last 72 hours. Thyroid Function Tests: No results for input(s): TSH, T4TOTAL, FREET4, T3FREE,  THYROIDAB in the last 72 hours. Anemia Panel: No results for input(s): VITAMINB12, FOLATE, FERRITIN, TIBC, IRON, RETICCTPCT in the last 72 hours. Urine analysis:    Component Value Date/Time   COLORURINE STRAW (A) 07/22/2021 1807   APPEARANCEUR CLEAR 07/22/2021 1807   APPEARANCEUR Clear 05/05/2018 1211   LABSPEC 1.015 07/22/2021 1807   PHURINE 6.0 07/22/2021 1807   GLUCOSEU NEGATIVE 07/22/2021 1807   HGBUR TRACE (A) 07/22/2021 1807   BILIRUBINUR NEGATIVE 07/22/2021 1807   BILIRUBINUR negative 09/12/2018 0919   BILIRUBINUR Negative 05/05/2018 [place] 07/22/2021 1807   PROTEINUR 100 (A) 07/22/2021 1807   UROBILINOGEN 1.0 09/12/2018 0919   NITRITE NEGATIVE 07/22/2021 1807   LEUKOCYTESUR NEGATIVE 07/22/2021 1807   Sepsis Labs: _0 (procalcitonin:4,lacticidven:4)  ) Recent Results (from the past 240 hour(s))  Culture, blood (routine x 2)     Status: Abnormal   Collection Time: 07/22/21  2:11 PM   Specimen: BLOOD  Result Value Ref Range Status   Specimen Description   Final    BLOOD RIGHT  ANTECUBITAL Performed at [facility], [place] [address], [place], [place] [address]    Special Requests   Final    BOTTLES DRAWN AEROBIC AND ANAEROBIC Blood Culture results may not be optimal due to an excessive volume of blood received in culture bottles Performed at [facility] [address] [place], [place] [address]    Culture  Setup Time   Final    GRAM POSITIVE COCCI IN CLUSTERS IN BOTH AEROBIC AND ANAEROBIC BOTTLES CRITICAL RESULT CALLED TO, READ BACK BY AND VERIFIED WITH: PHARMD M.SWAYME AT 4562 ON 07/23/2021 BY T.SAAD. Performed at [facility], [place] [address], [place], [place] [address]    Culture STAPHYLOCOCCUS AUREUS (A)  Final   Report Status 07/25/2021 FINAL  Final   Organism ID, Bacteria STAPHYLOCOCCUS AUREUS  Final      Susceptibility   Staphylococcus aureus - MIC*    CIPROFLOXACIN <=0.5 SENSITIVE Sensitive     ERYTHROMYCIN RESISTANT Resistant     GENTAMICIN <=0.5 SENSITIVE Sensitive     OXACILLIN <=0.25 SENSITIVE Sensitive     TETRACYCLINE <=1 SENSITIVE Sensitive     VANCOMYCIN 1 SENSITIVE Sensitive     TRIMETH/SULFA <=10 SENSITIVE Sensitive     CLINDAMYCIN RESISTANT Resistant     RIFAMPIN <=0.5 SENSITIVE Sensitive     Inducible Clindamycin POSITIVE Resistant     * STAPHYLOCOCCUS AUREUS  Blood Culture ID Panel (Reflexed)     Status: Abnormal   Collection Time: 07/22/21  2:11 PM  Result Value Ref Range Status   Enterococcus faecalis NOT DETECTED NOT DETECTED Final   Enterococcus Faecium NOT DETECTED NOT DETECTED Final   Listeria monocytogenes NOT DETECTED NOT DETECTED Final   Staphylococcus species DETECTED (A) NOT DETECTED Final    Comment: CRITICAL RESULT CALLED TO, READ BACK BY AND VERIFIED WITH: PHARMD M.SWAYME AT 1547 ON 07/23/2021 BY T.SAAD.    Staphylococcus aureus (BCID) DETECTED (A) NOT DETECTED Final    Comment: CRITICAL RESULT CALLED TO, READ BACK BY AND VERIFIED WITH: PHARMD M.SWAYME AT 1547 ON  07/23/2021 BY T.SAAD.    Staphylococcus epidermidis NOT DETECTED NOT DETECTED Final   Staphylococcus lugdunensis NOT DETECTED NOT DETECTED Final   Streptococcus species NOT DETECTED NOT DETECTED Final   Streptococcus agalactiae NOT DETECTED NOT DETECTED Final   Streptococcus pneumoniae NOT DETECTED NOT DETECTED Final   Streptococcus pyogenes NOT DETECTED NOT DETECTED Final   A.calcoaceticus-baumannii NOT DETECTED NOT DETECTED Final  Bacteroides fragilis NOT DETECTED NOT DETECTED Final   Enterobacterales NOT DETECTED NOT DETECTED Final   Enterobacter cloacae complex NOT DETECTED NOT DETECTED Final   Escherichia coli NOT DETECTED NOT DETECTED Final   Klebsiella aerogenes NOT DETECTED NOT DETECTED Final   Klebsiella oxytoca NOT DETECTED NOT DETECTED Final   Klebsiella pneumoniae NOT DETECTED NOT DETECTED Final   Proteus species NOT DETECTED NOT DETECTED Final   Salmonella species NOT DETECTED NOT DETECTED Final   Serratia marcescens NOT DETECTED NOT DETECTED Final   Haemophilus influenzae NOT DETECTED NOT DETECTED Final   Neisseria meningitidis NOT DETECTED NOT DETECTED Final   Pseudomonas aeruginosa NOT DETECTED NOT DETECTED Final   Stenotrophomonas maltophilia NOT DETECTED NOT DETECTED Final   Candida albicans NOT DETECTED NOT DETECTED Final   Candida auris NOT DETECTED NOT DETECTED Final   Candida glabrata NOT DETECTED NOT DETECTED Final   Candida krusei NOT DETECTED NOT DETECTED Final   Candida parapsilosis NOT DETECTED NOT DETECTED Final   Candida tropicalis NOT DETECTED NOT DETECTED Final   Cryptococcus neoformans/gattii NOT DETECTED NOT DETECTED Final   Meth resistant mecA/C and MREJ NOT DETECTED NOT DETECTED Final    Comment: Performed at [facility], [address] [place], [place] [address]  Culture, blood (routine x 2)     Status: None   Collection Time: 07/22/21  3:13 PM   Specimen: BLOOD  Result Value Ref Range Status   Specimen Description   Final    BLOOD  LEFT ANTECUBITAL Performed at [facility] [address], [place], [place] [address]    Special Requests   Final    BOTTLES DRAWN AEROBIC ONLY Blood Culture results may not be optimal due to an inadequate volume of blood received in culture bottles Performed at [facility] [address], [place], [place] [address]    Culture   Final    NO GROWTH 5 DAYS Performed at [facility], [place] [address], [place], [place] [address]    Report Status 07/27/2021 FINAL  Final  SARS CORONAVIRUS 2 (TAT 6-24 HRS) Nasopharyngeal     Status: None   Collection Time: 07/22/21  6:07 PM   Specimen: Nasopharyngeal  Result Value Ref Range Status   SARS Coronavirus 2 NEGATIVE NEGATIVE Final    Comment: (NOTE) SARS-CoV-2 target nucleic acids are NOT DETECTED.  The SARS-CoV-2 RNA is generally detectable in upper and lower respiratory specimens during the acute phase of infection. Negative results do not preclude SARS-CoV-2 infection, do not rule out co-infections with other pathogens, and should not be used as the sole basis for treatment or other patient management decisions. Negative results must be combined with clinical observations, patient history, and epidemiological information. The expected result is Negative.  Fact Sheet for Patients: SugarRoll.be  Fact Sheet for Healthcare Providers: https://www.woods-mathews.com/  This test is not yet approved or cleared by the [place] FDA and  has been authorized for detection and/or diagnosis of SARS-CoV-2 by FDA under an Emergency Use Authorization (EUA). This EUA will remain  in effect (meaning this test can be used) for the duration of the COVID-19 declaration under Se ction 564(b)(1) of the Act, 21 U.S.C. section 360bbb-3(b)(1), unless the authorization is terminated or revoked sooner.  Performed at [facility], [place] [address], [place],  [place] [address]   Culture, blood (routine x 2)     Status: None   Collection Time: 07/24/21  5:04 PM   Specimen: BLOOD  Result Value Ref Range Status   Specimen Description   Final    BLOOD RIGHT ANTECUBITAL Performed at [facility] [address], [place], [place] [address]    Special Requests   Final    BOTTLES DRAWN AEROBIC AND ANAEROBIC Blood Culture adequate volume Performed at [facility] [address], [place], [place] [address]    Culture   Final    NO GROWTH 5 DAYS Performed at [facility], [place] [address], [place], [place] [address]    Report Status 07/29/2021 FINAL  Final  Culture, blood (routine x 2)     Status: None   Collection Time: 07/24/21  5:04 PM   Specimen: BLOOD  Result Value Ref Range Status   Specimen Description   Final    BLOOD LEFT ANTECUBITAL Performed at [facility] [address], [place], [place] [address]    Special Requests   Final    BOTTLES DRAWN AEROBIC AND ANAEROBIC Blood Culture adequate volume Performed at [facility] [address], [place], [place] [address]    Culture   Final    NO GROWTH 5 DAYS Performed at [facility], [place] [address], [place], [place] [address]    Report Status 07/29/2021 FINAL  Final      Studies: [place] EKG SITE RITE  Result Date: 07/30/2021 If Site Rite image not attached, placement could not be confirmed due to current cardiac rhythm.   Scheduled Meds:  amLODipine  10 mg Oral Daily   atorvastatin  40 mg Oral Daily   collagenase   Topical Daily   cyclobenzaprine  10 mg Oral BID   feeding supplement  1 Container Oral TID BM   fentaNYL  1 patch Transdermal Q72H   gabapentin  200 mg Oral TID   heparin  5,000 Units Subcutaneous Q8H   hydrALAZINE  50 mg Oral Q8H   insulin aspart  0-9 Units Subcutaneous Q6H   lactulose  20 g Oral BID   multivitamin with minerals  1 tablet Oral Daily   mupirocin ointment  1 application  Topical BID   senna-docusate  1 tablet Oral BID   traZODone  100 mg Oral QHS   verapamil  120 mg Oral Daily    Continuous Infusions:   ceFAZolin (ANCEF) IV 2 g (07/31/21 0518)   chlorproMAZINE (THORAZINE) IV 25 mg (07/31/21 0819)     LOS: 9 days     Cristal Deer, MD [facility]  To reach me or the doctor on call, go to: [email or web address] Password [facility]  07/31/2021, 8:48 AM

## 2021-07-31 NOTE — Progress Notes (Addendum)
0745 bedside report done with nightshift RN pt reporting throughout the night he would wake up with L sided chest pain, stating "it will come and go"  MD Kyung Bacca made aware via secure chat.  Stated due to disease pt could have this pain- EKG ordered for cardiac workup.  Pt given Thorazine for hiccups and oxycodone for the pain.    1100 spouse Junie Panning spoke with RN over the phone requesting palliative care to call her back for more questions she had. Messaged Dr Domingo Cocking via secure chat to make aware.  Dr Domingo Cocking responded stating he was in meetings and would give pt's wife a call in the afternoon. Wife aware.

## 2021-07-31 NOTE — Progress Notes (Signed)
Brief Nutrition Note  RD consulted for Boost Breeze supplements causing hyperglycemia in patient.  Another RD saw patient yesterday. Patient not eating too well. Pt interested in taking Boost Breeze supplements at that time.  This RD to discontinue Boost Breeze and trial Glucerna shakes TID, a more carb-friendly option.  Continue other interventions per previous RD note from yesterday.  Unit RD will continue to monitor.  Derrel Nip, RD, LDN (she/her/hers) Registered Dietitian I After-Hours/Weekend Pager # in [place]

## 2021-07-31 NOTE — Progress Notes (Signed)
Peripherally Inserted Central Catheter Placement  The IV Nurse has discussed with the patient and/or persons authorized to consent for the patient, the purpose of this procedure and the potential benefits and risks involved with this procedure.  The benefits include less needle sticks, lab draws from the catheter, and the patient may be discharged home with the catheter. Risks include, but not limited to, infection, bleeding, blood clot (thrombus formation), and puncture of an artery; nerve damage and irregular heartbeat and possibility to perform a PICC exchange if needed/ordered by physician.  Alternatives to this procedure were also discussed.  Bard Power PICC patient education guide, fact sheet on infection prevention and patient information card has been provided to patient /or left at bedside.    PICC Placement Documentation  PICC Single Lumen 07/31/21 Right Brachial 40 cm 0 cm (Active)  Indication for Insertion or Continuance of Line Home intravenous therapies (PICC only) 07/31/21 1035  Exposed Catheter (cm) 0 cm 07/31/21 1035  Site Assessment Clean;Dry;Intact 07/31/21 1035  Line Status Flushed;Saline locked;Blood return noted 07/31/21 1035  Dressing Type Transparent;Securing device 07/31/21 1035  Dressing Status Clean;Dry;Intact 07/31/21 1035  Antimicrobial disc in place? Yes 07/31/21 1035  Safety Lock Not Applicable [id number]  Line Care Connections checked and tightened 07/31/21 1035  Dressing Change Due 08/07/21 07/31/21 [place], [place] 07/31/2021, 10:37 AM

## 2021-08-01 DIAGNOSIS — M898X9 Other specified disorders of bone, unspecified site: Secondary | ICD-10-CM | POA: Diagnosis not present

## 2021-08-01 DIAGNOSIS — Z515 Encounter for palliative care: Secondary | ICD-10-CM | POA: Diagnosis not present

## 2021-08-01 DIAGNOSIS — M899 Disorder of bone, unspecified: Secondary | ICD-10-CM | POA: Diagnosis not present

## 2021-08-01 LAB — BASIC METABOLIC PANEL
Anion gap: 10 (ref 5–15)
BUN: 35 mg/dL — ABNORMAL HIGH (ref 6–20)
CO2: 23 mmol/L (ref 22–32)
Calcium: 9 mg/dL (ref 8.9–10.3)
Chloride: 95 mmol/L — ABNORMAL LOW (ref 98–111)
Creatinine, Ser: 2.08 mg/dL — ABNORMAL HIGH (ref 0.61–1.24)
GFR, Estimated: 39 mL/min — ABNORMAL LOW (ref >60)
Glucose, Bld: 218 mg/dL — ABNORMAL HIGH (ref 70–99)
Potassium: 4.3 mmol/L (ref 3.5–5.1)
Sodium: 128 mmol/L — ABNORMAL LOW (ref 135–145)

## 2021-08-01 LAB — CBC
HCT: 26.3 % — ABNORMAL LOW (ref 39.0–52.0)
Hemoglobin: 8.6 g/dL — ABNORMAL LOW (ref 13.0–17.0)
MCH: 27.8 pg (ref 26.0–34.0)
MCHC: 32.7 g/dL (ref 30.0–36.0)
MCV: 85.1 fL (ref 80.0–100.0)
Platelets: 395 10*3/uL (ref 150–400)
RBC: 3.09 MIL/uL — ABNORMAL LOW (ref 4.22–5.81)
RDW: 13.2 % (ref 11.5–15.5)
WBC: 19.5 10*3/uL — ABNORMAL HIGH (ref 4.0–10.5)
nRBC: 0 % (ref 0.0–0.2)

## 2021-08-01 LAB — GLUCOSE, CAPILLARY
Glucose-Capillary: 223 mg/dL — ABNORMAL HIGH (ref 70–99)
Glucose-Capillary: 235 mg/dL — ABNORMAL HIGH (ref 70–99)
Glucose-Capillary: 282 mg/dL — ABNORMAL HIGH (ref 70–99)

## 2021-08-01 MED ORDER — LIP MEDEX EX OINT
TOPICAL_OINTMENT | CUTANEOUS | Status: AC
  Filled 2021-08-01: qty 7

## 2021-08-01 NOTE — Progress Notes (Signed)
Daily Progress Note   Patient Name: Jose [name]       Date: 08/01/2021 DOB: [birth year]  Age: 45 y.o. MRN#: [id number] Attending Physician: Cristal Deer, MD Primary Care Physician: [name], NP Admit Date: 07/22/2021  Reason for Consultation/Follow-up: Non pain symptom management and Pain control  Subjective: Chart reviewed.  MAR reveals that in the last 24 hours he has used 1 dose of 1 mg of IV Dilaudid, and 5 doses of 10 mg of oxycodone in addition to long acting fentanyl 51mg/hr patch.    He reports that overall, pain regimen has been much better.  He is still using rescue medication around the clock, but, as I would anticipate his pain to be improving over the next couple of weeks, we discussed continuing with current regimen of more frequent as needed dosing rather than increasing his long acting agent as I think his need for frequency of breakthrough dosing will naturally decrease as his clinical condition improves.  Much of his pain remains positional and his major concern today is being able to manage ADLs at home.    At time I was seeing him, he was sitting around 30 degrees in bed and finishing breakfast.  He reports that this is position where pain is best controlled and he is best able to function.  He and wife are both concerned about how he will manage at home.  Discussed that I agree with PT assessment that he will benefit from short term use of hospital bed for the next few weeks while pain is still acutely worse as this will allow him the most comfortable position to minimize need for narcotics and allow him to be able to eat and more easily perform his ADLs.  We also discussed use of temporary disability placard.  Length of Stay: 10  Current Medications: Scheduled  Meds:  . acetaminophen  1,000 mg Oral TID  . amLODipine  10 mg Oral Daily  . atorvastatin  40 mg Oral Daily  . Chlorhexidine Gluconate Cloth  6 each Topical Daily  . collagenase   Topical Daily  . cyclobenzaprine  10 mg Oral BID  . feeding supplement (GLUCERNA SHAKE)  237 mL Oral TID BM  . fentaNYL  1 patch Transdermal Q72H  . gabapentin  200 mg Oral TID  . heparin  5,000 Units Subcutaneous Q8H  . hydrALAZINE  50 mg Oral Q8H  . insulin aspart  0-9 Units Subcutaneous Q6H  . lactulose  20 g Oral BID  . multivitamin with minerals  1 tablet Oral Daily  . mupirocin ointment  1 application Topical BID  . senna-docusate  1 tablet Oral BID  . sodium chloride flush  10-40 mL Intracatheter Q12H  . traZODone  100 mg Oral QHS  . verapamil  120 mg Oral Daily    Continuous Infusions: .  ceFAZolin (ANCEF) IV 2 g (08/01/21 0509)  . chlorproMAZINE (THORAZINE) IV 25 mg (07/31/21 0819)    PRN Meds: albuterol, ALPRAZolam, chlorproMAZINE (THORAZINE) IV, hydrALAZINE, HYDROmorphone (DILAUDID) injection, oxyCODONE, phenol, sodium chloride flush  Physical Exam         Resting in bed Regular work of breathing No distress More awake and interactive  Abdomen not distended No edema Has pain and swelling, some warmth to R posterior flank area.   Vital Signs: BP (!) 158/85   Pulse (!) 105   Temp 98.1 F (36.7 C) (Oral)   Resp 19   Ht _0  (1.803 m)   Wt 86.2 kg   SpO2 90%   BMI 26.50 kg/m  SpO2: SpO2: 90 % O2 Device: O2 Device: Nasal Cannula O2 Flow Rate: O2 Flow Rate (L/min): 2 L/min  Intake/output summary:  Intake/Output Summary (Last 24 hours) at 08/01/2021 0951 Last data filed at 07/31/2021 1214 Gross per 24 hour  Intake 250 ml  Output 400 ml  Net -150 ml    LBM: Last BM Date: 08/01/21 Baseline Weight: Weight: 87.9 kg Most recent weight: Weight: 86.2 kg       Palliative Assessment/Data:      Patient Active Problem List   Diagnosis Date Noted  . Diabetic ulcer of left  great toe ([place])   . MSSA bacteremia 07/23/2021  . Constipation 07/23/2021  . Lytic bone lesions on xray 07/22/2021  . AKI (acute kidney injury) ([place]) 05/19/2021  . Anemia 05/19/2021  . Nausea and vomiting 05/19/2021  . Elevated blood pressure reading 05/19/2021  . COVID-19 05/18/2021  . Bilateral carpal tunnel syndrome 01/23/2020  . Numbness 12/12/2019  . Microalbuminuria due to type 2 diabetes mellitus ([place]) 07/20/2019  . GAD (generalized anxiety disorder) 06/18/2019  . Adult ADHD 06/18/2019  . Trigger middle finger of left hand 06/11/2019  . Diabetic retinopathy ([place]) 07/13/2018  . Type 2 diabetes mellitus with diabetic polyneuropathy, without long-term current use of insulin ([place]) 05/17/2018  . Tobacco use disorder 05/17/2018  . Hyperlipidemia 05/17/2018    Palliative Care Assessment & Plan   Patient Profile:    Assessment:  45 y.o. male  with past medical history of type 2 diabetes with retinopathy, neuropathy, microalbuminemia, history of hypertension dyslipidemia stage III chronic kidney disease, COVID-19 infection in July 2022 admitted on 07/22/2021 with chest pain radiating to bilateral shoulder blades for 6 weeks.    Patient presented to the emergency department and was admitted to hospital medicine service.  Patient was found to have CT chest with lytic bone lesions involving T5 and T6 as well as the sternum.  There was concern for pathologic fracture of T5.  Multiple myeloma is in the differential diagnosis.  Found to have MSSA bacteremia. Patient is being followed by medical oncology and infectious disease.  He was also seen by podiatry because of left hallux ulcer.  Patient was seen by interventional radiology and is to underwent bone marrow biopsy on 07-28-2021.  He continues  on antibiotics. Palliative consultation for pain management has been requested.  Recommendations/Plan: Pain: [name] remains more interactive, relaxed, and comfortable today.  Continue fentanyl  patch at 50 mcg/h.  We discussed plan to continue utilizing oral medications today to ensure that this is a regimen that will adequately manage his pain at home.  He does have backup dose of IV Dilaudid to be used 45 minutes after oral medication if oral medication is not sufficient to relieve his pain.  We also made addition of scheduled Tylenol yesterday.  Recommend continuing with current regimen at time of discharge (being more liberal with as needed dosing rather than increasing his long acting agent as I think his need for frequency of breakthrough dosing will naturally decrease as his clinical condition improves).  We also discussed non-pharmacologic factors to control pain, such as positioning as he has positional and incident related pain.  I agree with PT assessment that he will benefit from hospital bed at discharge as he is better able to complete ADLs, such as feeding, with bed at 30 degree position. Constipation, opioid related:  Continue bowel regimen. Appreciate multi disciplinary team approach and support.   Care concerns voiced by Mr. Kozak and his wife include: Ensuring adequate pain regimen (palliative team working to manage this) Obtaining disability placard (I completed medical necessity form today) Ensuring that all needed equipment, including bed, is set up prior to consideration for discharge (Junie Panning is working to secure bed privately and will then seek reimbursement from insurance.  Bed is not yet in the home.) Understanding regimen for diabetes management at discharge as he is currently on insulin in the hospital but does not use it at home.  They questioned if input was needed from his endocrinologist (I told them that this would be determined by primary attending).  Goals of Care and Additional Recommendations: Limitations on Scope of Treatment: Full Scope Treatment  Code Status:    Code Status Orders  (From admission, onward)           Start     Ordered   07/22/21  1845  Full code  Continuous        07/22/21 1844           Code Status History     Date Active Date Inactive Code Status Order ID Comments User Context   05/19/2021 0418 05/21/2021 1847 Full Code [id number]  Tacey Ruiz, MD Inpatient       Discharge Planning: Home with home health  Care plan was discussed with patient and wife.  Thank you for allowing the Palliative Medicine Team to assist in the care of this patient.   Total Time 50 Prolonged Time Billed No   Greater than 50%  of this time was spent counseling and coordinating care related to the above assessment and plan.  Micheline Rough, MD  Please contact Palliative Medicine Team phone at [phone] for questions and concerns.

## 2021-08-01 NOTE — Progress Notes (Addendum)
PROGRESS NOTE  Jose [name] JTT:[id number] DOB: [birth year] DOA: 07/22/2021 PCP: [name], NP  HPI/Recap of past 24 hours:  This is a 45 year old gentleman with history of diabetes, with retinopathy, with neuropathy, microalbuminuria, hyper tension, hyperlipidemia, chronic kidney disease stage III, COVID-19 infection in July 2022, patient presented to the emergency department for evaluation of abnormal curvature of the spine with elevated blood pressure he was also found to be anemic with hemoglobin of 7.5 D-dimer was elevated but VQ scan was negative for PE and he was found to be having leukocytosis.  CT scan of the chest without contrast revealed lytic bone lesion involving T5 and T6 On there was concern for pathologic fracture of T6 with paraspinal hematoma or tumor or metastatic bone disease oncology was consulted and he was recommended to work-up for multiple myeloma patient was having lots of pain and pain management was started He was also found to have MSSA bacteremia and infectious disease was consulted.  He underwent bone marrow biopsy of T5 which returned to be consistent with osteomyelitis so the results of his work-up did not support diagnosis of multiple myeloma.  Oncology has signed off.  Subjective: July 31, 2021: Patient seen and examined at bedside.  He stated that he is doing much better  August 01, 2021: Patient seen and examined at bedside he stated he did not sleep very much.  He was having some pain in the left chest wall which improved by putting a pillow under the left shoulder. He stated that they are waiting for hospital bed and other things to be set up at home and is supposed to deliver the hospital bed tomorrow to his home He was able to get up and walk with walker  Assessment/Plan: Principal Problem:   Lytic bone lesions on xray Active Problems:   Type 2 diabetes mellitus with diabetic polyneuropathy, without long-term current use of insulin (H[facility])    Hyperlipidemia   GAD (generalized anxiety disorder)   AKI (acute kidney injury) ([place])   Anemia   MSSA bacteremia   Constipation   Diabetic ulcer of left great toe ([place])  1.-Lytic bone lesion in the setting of MSSA bacteremia CT chest with lytic bone disease involving T5 and T6 is consistent with osteomyelitis not myeloma. ID is involved and he is getting IV antibiotics Plan is been made for him to continue IV antibiotics at home, did outpatient antibiotic therapy order has been signed.  2.  Back pain palliative care involvement for pain management appreciated Continue Xanax Flexeril Tylenol and Neurontin  3.  Acute on chronic kidney disease stage III A Holding ARB, Holding Lasix Creatinine has been stable  4.  Volume overload  No distress seems to be euvolemic now Still holding Lasix due to AKI  5.  Type 2 diabetes mellitus  continue current management with sliding scale insulin Holding his metformin due to AKI  6.  Hypertension Uncontrolled amlodipine was increased to 10 mg  7.  Hyponatremia.  Is sodium today is 128 Holding IV fluids due to history of volume overload  we will monitor  8.  Decubitus ulcer  continue Santyl dressing  Code Status: Full  Severity of Illness: The appropriate patient status for this patient is INPATIENT. Inpatient status is judged to be reasonable and necessary in order to provide the required intensity of service to ensure the patient's safety. The patient's presenting symptoms, physical exam findings, and initial radiographic and laboratory data in the context of their chronic comorbidities is  felt to place them at high risk for further clinical deterioration. Furthermore, it is not anticipated that the patient will be medically stable for discharge from the hospital within 2 midnights of admission. The following factors support the patient status of inpatient.   " Not ready for discharge  * I certify that at the point of admission it is my  clinical judgment that the patient will require inpatient hospital care spanning beyond 2 midnights from the point of admission due to high intensity of service, high risk for further deterioration and high frequency of surveillance required.*   Family Communication: None at bedside  Disposition Plan: Home Status is: Inpatient   Dispo: The patient is from: Home              Anticipated d/c is to: Home              Anticipated d/c date is: 1 to 2 days              Patient currently not medically stable for discharge  Consultants: Oncology Palliative care  Procedures: TEE TTE Antimicrobials: Cefazolin  DVT prophylaxis: Subcu heparin   Objective: Vitals:   07/31/21 1435 07/31/21 2007 08/01/21 0458 08/01/21 1316  BP: (!) 158/82 136/74 (!) 158/85 (!) 145/87  Pulse: (!) 108 (!) 106 (!) 105 100  Resp: 18 20 19 16   Temp: 98.2 F (36.8 C) 98.4 F (36.9 C) 98.1 F (36.7 C) 98.9 F (37.2 C)  TempSrc: Oral Oral Oral Oral  SpO2: 95% 92% 90% 94%  Weight:      Height:       No intake or output data in the 24 hours ending 08/01/21 1725  Filed Weights   07/27/21 0622 07/29/21 0500 07/29/21 1136  Weight: 85.7 kg 85.9 kg 86.2 kg   Body mass index is 26.5 kg/m.  Exam:  General: 45 y.o. year-old male well developed well nourished in no acute distress.  Alert and oriented x3. Cardiovascular: Regular rate and rhythm with no rubs or gallops.  No thyromegaly or JVD noted.   Respiratory: Clear to auscultation with no wheezes or rales. Good inspiratory effort. Abdomen: Soft nontender nondistended with normal bowel sounds x4 quadrants. Musculoskeletal: No lower extremity edema. 2/4 pulses in all 4 extremities. Skin: Decubitus ulcer on back Psychiatry: Mood is appropriate for condition and setting Neurology:    Data Reviewed: CBC: Recent Labs  Lab 07/26/21 0735 07/27/21 0502 07/28/21 0520 07/31/21 0531 08/01/21 1049  WBC 13.7* 12.8* 12.5* 13.3* 19.5*  NEUTROABS 10.4* 9.6*  9.3* 9.3*  --   HGB 8.1* 7.8* 8.4* 8.1* 8.6*  HCT 24.1* 23.5* 25.2* 24.7* 26.3*  MCV 84.9 84.8 84.0 84.6 85.1  PLT 310 291 358 352 638    Basic Metabolic Panel: Recent Labs  Lab 07/26/21 0735 07/27/21 0502 07/28/21 0520 07/29/21 0517 07/31/21 0531 08/01/21 1049  NA 137 137 135 138 135 128*  K 4.4 4.7 4.0 4.2 4.3 4.3  CL 103 104 103 103 102 95*  CO2 24 20* 23 23 24 23   GLUCOSE 222* 158* 134* 138* 253* 218*  BUN 25* 27* 26* 28* 32* 35*  CREATININE 2.02* 2.09* 1.93* 1.92* 1.73* 2.08*  CALCIUM 9.3 9.1 9.0 9.3 9.2 9.0  PHOS 4.0  --   --  5.4*  --   --     GFR: Estimated Creatinine Clearance: 46.8 mL/min (A) (by C-G formula based on SCr of 2.08 mg/dL (H)). Liver Function Tests: Recent Labs  Lab 07/26/21 0735 07/29/21  4827  ALBUMIN 2.4* 2.3*    No results for input(s): LIPASE, AMYLASE in the last 168 hours. No results for input(s): AMMONIA in the last 168 hours. Coagulation Profile: Recent Labs  Lab 07/27/21 1749  INR 1.2    Cardiac Enzymes: No results for input(s): CKTOTAL, CKMB, CKMBINDEX, TROPONINI in the last 168 hours. BNP (last 3 results) No results for input(s): PROBNP in the last 8760 hours. HbA1C: No results for input(s): HGBA1C in the last 72 hours. CBG: Recent Labs  Lab 07/31/21 0153 07/31/21 0727 07/31/21 1358 08/01/21 0730 08/01/21 1452  GLUCAP 295* 229* 244* 223* 235*    Lipid Profile: No results for input(s): CHOL, HDL, LDLCALC, TRIG, CHOLHDL, LDLDIRECT in the last 72 hours. Thyroid Function Tests: No results for input(s): TSH, T4TOTAL, FREET4, T3FREE, THYROIDAB in the last 72 hours. Anemia Panel: No results for input(s): VITAMINB12, FOLATE, FERRITIN, TIBC, IRON, RETICCTPCT in the last 72 hours. Urine analysis:    Component Value Date/Time   COLORURINE STRAW (A) 07/22/2021 1807   APPEARANCEUR CLEAR 07/22/2021 1807   APPEARANCEUR Clear 05/05/2018 1211   LABSPEC 1.015 07/22/2021 1807   PHURINE 6.0 07/22/2021 1807   GLUCOSEU NEGATIVE  07/22/2021 1807   HGBUR TRACE (A) 07/22/2021 1807   BILIRUBINUR NEGATIVE 07/22/2021 1807   BILIRUBINUR negative 09/12/2018 0919   BILIRUBINUR Negative 05/05/2018 1211   KETONESUR NEGATIVE 07/22/2021 1807   PROTEINUR 100 (A) 07/22/2021 1807   UROBILINOGEN 1.0 09/12/2018 0919   NITRITE NEGATIVE 07/22/2021 1807   LEUKOCYTESUR NEGATIVE 07/22/2021 1807   Sepsis Labs: @LABRCNTIP (procalcitonin:4,lacticidven:4)  ) Recent Results (from the past 240 hour(s))  SARS CORONAVIRUS 2 (TAT 6-24 HRS) Nasopharyngeal     Status: None   Collection Time: 07/22/21  6:07 PM   Specimen: Nasopharyngeal  Result Value Ref Range Status   SARS Coronavirus 2 NEGATIVE NEGATIVE Final    Comment: (NOTE) SARS-CoV-2 target nucleic acids are NOT DETECTED.  The SARS-CoV-2 RNA is generally detectable in upper and lower respiratory specimens during the acute phase of infection. Negative results do not preclude SARS-CoV-2 infection, do not rule out co-infections with other pathogens, and should not be used as the sole basis for treatment or other patient management decisions. Negative results must be combined with clinical observations, patient history, and epidemiological information. The expected result is Negative.  Fact Sheet for Patients: SugarRoll.be  Fact Sheet for Healthcare Providers: https://www.woods-mathews.com/  This test is not yet approved or cleared by the [place] FDA and  has been authorized for detection and/or diagnosis of SARS-CoV-2 by FDA under an Emergency Use Authorization (EUA). This EUA will remain  in effect (meaning this test can be used) for the duration of the COVID-19 declaration under Se ction 564(b)(1) of the Act, 21 U.S.C. section 360bbb-3(b)(1), unless the authorization is terminated or revoked sooner.  Performed at [facility], [place] [address], [place], [place] [address]   Culture, blood (routine x 2)     Status: None    Collection Time: 07/24/21  5:04 PM   Specimen: BLOOD  Result Value Ref Range Status   Specimen Description   Final    BLOOD RIGHT ANTECUBITAL Performed at [facility] [address] [place], [place] [address]    Special Requests   Final    BOTTLES DRAWN AEROBIC AND ANAEROBIC Blood Culture adequate volume Performed at [facility] [address], [place], [place] [address]    Culture   Final    NO GROWTH 5 DAYS Performed  at [facility], [place] [address], [place], [place] [address]    Report Status 07/29/2021 FINAL  Final  Culture, blood (routine x 2)     Status: None   Collection Time: 07/24/21  5:04 PM   Specimen: BLOOD  Result Value Ref Range Status   Specimen Description   Final    BLOOD LEFT ANTECUBITAL Performed at [facility] [address] [place], [place] [address]    Special Requests   Final    BOTTLES DRAWN AEROBIC AND ANAEROBIC Blood Culture adequate volume Performed at [facility] [address], [place], [place] [address]    Culture   Final    NO GROWTH 5 DAYS Performed at [facility], [place] [address], [place], [place] [address]    Report Status 07/29/2021 FINAL  Final      Studies: No results found.  Scheduled Meds:  acetaminophen  1,000 mg Oral TID   amLODipine  10 mg Oral Daily   atorvastatin  40 mg Oral Daily   Chlorhexidine Gluconate Cloth  6 each Topical Daily   collagenase   Topical Daily   cyclobenzaprine  10 mg Oral BID   feeding supplement (GLUCERNA SHAKE)  237 mL Oral TID BM   fentaNYL  1 patch Transdermal Q72H   gabapentin  200 mg Oral TID   heparin  5,000 Units Subcutaneous Q8H   hydrALAZINE  50 mg Oral Q8H   insulin aspart  0-9 Units Subcutaneous Q6H   lactulose  20 g Oral BID   multivitamin with minerals  1 tablet Oral Daily   mupirocin ointment  1 application Topical BID   senna-docusate  1 tablet Oral BID   sodium chloride flush  10-40 mL  Intracatheter Q12H   traZODone  100 mg Oral QHS   verapamil  120 mg Oral Daily    Continuous Infusions:   ceFAZolin (ANCEF) IV 2 g (08/01/21 1508)   chlorproMAZINE (THORAZINE) IV 25 mg (07/31/21 0819)     LOS: 10 days     Cristal Deer, MD [facility]  To reach me or the doctor on call, go to: [email or web address] Password [email or web address]  08/01/2021, 5:25 PM

## 2021-08-02 DIAGNOSIS — M899 Disorder of bone, unspecified: Secondary | ICD-10-CM | POA: Diagnosis not present

## 2021-08-02 DIAGNOSIS — M546 Pain in thoracic spine: Secondary | ICD-10-CM | POA: Diagnosis not present

## 2021-08-02 DIAGNOSIS — R7881 Bacteremia: Secondary | ICD-10-CM | POA: Diagnosis not present

## 2021-08-02 DIAGNOSIS — Z515 Encounter for palliative care: Secondary | ICD-10-CM | POA: Diagnosis not present

## 2021-08-02 LAB — CBC
HCT: 24.1 % — ABNORMAL LOW (ref 39.0–52.0)
Hemoglobin: 7.8 g/dL — ABNORMAL LOW (ref 13.0–17.0)
MCH: 28 pg (ref 26.0–34.0)
MCHC: 32.4 g/dL (ref 30.0–36.0)
MCV: 86.4 fL (ref 80.0–100.0)
Platelets: 385 10*3/uL (ref 150–400)
RBC: 2.79 MIL/uL — ABNORMAL LOW (ref 4.22–5.81)
RDW: 13.2 % (ref 11.5–15.5)
WBC: 16.9 10*3/uL — ABNORMAL HIGH (ref 4.0–10.5)
nRBC: 0 % (ref 0.0–0.2)

## 2021-08-02 LAB — BASIC METABOLIC PANEL
Anion gap: 9 (ref 5–15)
BUN: 39 mg/dL — ABNORMAL HIGH (ref 6–20)
CO2: 25 mmol/L (ref 22–32)
Calcium: 9.2 mg/dL (ref 8.9–10.3)
Chloride: 101 mmol/L (ref 98–111)
Creatinine, Ser: 2.32 mg/dL — ABNORMAL HIGH (ref 0.61–1.24)
GFR, Estimated: 34 mL/min — ABNORMAL LOW (ref >60)
Glucose, Bld: 241 mg/dL — ABNORMAL HIGH (ref 70–99)
Potassium: 4.9 mmol/L (ref 3.5–5.1)
Sodium: 135 mmol/L (ref 135–145)

## 2021-08-02 LAB — GLUCOSE, CAPILLARY: Glucose-Capillary: 248 mg/dL — ABNORMAL HIGH (ref 70–99)

## 2021-08-02 MED ORDER — SODIUM CHLORIDE 0.9 % IV SOLN: INTRAVENOUS | Status: DC

## 2021-08-02 NOTE — TOC Transition Note (Addendum)
Transition of Care [facility]) - CM/SW Discharge Note   Patient Details  Name: Jose [name] MRN: [id number] Date of Birth: 30-Apr-1976  Transition of [facility]) CM/SW Contact:  Illene Regulus, LCSW Phone Number: 08/02/2021, 10:24 AM   Clinical Narrative:    CSW confirmed pt received DME and is set up with [facility] for IV antibiotics.   11:40am CSW spoke with pt's spouse Junie Panning , she reported ordering hospital bed out of pocket and expecting to pick up bed around 1pm today. CSW to follow up with pt's wife to confirmed receipt of hospital bed.  1:30pm CSW received call from pt's wife , she stated hospital bed is set up in the home. CSW informed MD.  3:07pm CSW spoke with Pam with [facility], she stated pt's will not be able to d/c today due to antibiotic being delivered on Monday. Pam stated she will contact pt's wife and answer any questions she has about the infusion.       Final next level of care: [place] Barriers to Discharge: Barriers Resolved   Patient Goals and CMS Choice        Discharge Placement                       Discharge Plan and Services   Discharge Planning Services: CM Consult            DME Arranged: Gilford Rile rolling, 3-N-1 DME Agency: [employer] Date DME Agency Contacted: 07/30/21 Time DME Agency Contacted: 37 Representative spoke with at DME Agency: [place] Arranged: RN, IV Antibiotics HH Agency: [facility] Date [place]: 07/30/21   Representative spoke with at [place]: [place]) Interventions     Readmission Risk Interventions Readmission Risk Prevention Plan 07/30/2021  Transportation Screening Complete  PCP or Specialist Appt within 3-5 Days Complete  HRI or [place] Complete  Social Work Consult for [place] Planning/Counseling Complete  Palliative Care Screening Complete  Medication Review Press photographer) Complete   Some recent data might be hidden

## 2021-08-02 NOTE — Progress Notes (Signed)
Occupational Therapy Treatment Patient Details Name: Jose [name] MRN: [id number] DOB: 05/23/1976 Today's Date: 08/02/2021   History of present illness Patient 45 year old gentleman presented to the ED with complaints of chest pain radiating to bilateral shoulder blades x6 weeks. CT chest without contrast with lytic bone lesions involving T5 and T6 and the sternum, concern for pathologic fracture of T6 with paraspinal hematoma or tumor could represent metastatic bone disease or possibly myeloma. PMH: type 2 diabetes with retinopathy, neuropathy, microalbuminemia, hypertension, hyperlipidemia, chronic kidney disease stage III, COVID-19 infection July 2022   OT comments  Patient reports pain at rest. Reports he has been going to the bathroom independently. Today's treatment focused on recommendations for DME, AE and other miscellaneous information in preparation for patient's return home. Patient verbalized understanding of all education. Will continue to follow acutely.     Recommendations for follow up therapy are one component of a multi-disciplinary discharge planning process, led by the attending physician.  Recommendations may be updated based on patient status, additional functional criteria and insurance authorization.    Follow Up Recommendations  Home health OT;Supervision - Intermittent    Equipment Recommendations  Other (comment) (See note)    Recommendations for Other Services      Precautions / Restrictions Precautions Precautions: Fall Precaution Comments: no formal sternal precautions, but would encourage them based on CT Restrictions Weight Bearing Restrictions: No        ADL either performed or assessed with clinical judgement   ADL Overall ADL's : Needs assistance/impaired                                       General ADL Comments: Treatment focused on education and needed DME for return home soon. Patient getting a hospital bed. Patient  reports concerns for tub transfers. Patient already received 3 in 1. Therapist recommended use of tub bench if patient continues to be fearful. Recommended attempt use of 3 in one in tub and postion elbows on knees for positioning comfort. Reports he has a hand held shower head but reaching causes pain and discomfort. Informed patient of informal use of sternal precautions and limiting specific movements as well as pushing and pulling. Patient reports doing LB dressing and toileting well from knee level - therapist recommended use of LHR and he reports he already has one. Therapist recommended he keep it close. Also recommended LH sponge and slip on shoes. Patient asked if he should wear a brace around hsi sternum - he had been doing so. Therapist responsed he should position himself and wear a brace "For comfort." Patient verbalized understand and grateful for therapist visit.     Vision       Perception     Praxis      Cognition Arousal/Alertness: Awake/alert Behavior During Therapy: WFL for tasks assessed/performed Overall Cognitive Status: Within Functional Limits for tasks assessed                                          Exercises     Shoulder Instructions       General Comments      Pertinent Vitals/ Pain       Pain Assessment: Faces Faces Pain Scale: Hurts whole lot Pain Location: chest and shoulder blades (lesions) Pain Descriptors / Indicators: Grimacing;Restless Pain  Intervention(s): Monitored during session  Home Living                                          Prior Functioning/Environment              Frequency  Min 2X/week        Progress Toward Goals  OT Goals(current goals can now be found in the care plan section)  Progress towards OT goals: Progressing toward goals  Acute Rehab OT Goals Patient Stated Goal: decrease pain OT Goal Formulation: With patient Time For Goal Achievement: 08/09/21 Potential to  Achieve Goals: [place] Discharge plan remains appropriate    Co-evaluation          OT goals addressed during session: ADL's and self-care      AM-PAC OT "6 Clicks" Daily Activity     Outcome Measure   Help from another person eating meals?: A Little Help from another person taking care of personal grooming?: A Little Help from another person toileting, which includes using toliet, bedpan, or urinal?: A Little Help from another person bathing (including washing, rinsing, drying)?: A Lot Help from another person to put on and taking off regular upper body clothing?: A Little Help from another person to put on and taking off regular lower body clothing?: A Lot 6 Click Score: 16    End of Session    OT Visit Diagnosis: Unsteadiness on feet (R26.81);Pain   Activity Tolerance Patient limited by pain   Patient Left in bed;with call bell/phone within reach   Nurse Communication          Time: [phone] OT Time Calculation (min): 22 min  Charges: OT General Charges $OT Visit: 1 Visit OT Treatments $Self Care/Home Management : 8-22 mins  Derl Barrow, OTR/L [place]  Office [phone] Pager: [place] 08/02/2021, 11:59 AM

## 2021-08-02 NOTE — Progress Notes (Signed)
Daily Progress Note   Patient Name: Jose [name]       Date: 08/02/2021 DOB: 11-Jul-1976  Age: 45 y.o. MRN#: [id number] Attending Physician: Cristal Deer, MD Primary Care Physician: [name], NP Admit Date: 07/22/2021  Reason for Consultation/Follow-up: Non pain symptom management and Pain control  Subjective: Chart reviewed.  MAR reveals that in the last 24 hours he has used 6 doses of 10 mg of oxycodone in addition to long acting fentanyl 80mg/hr patch but he has not required IV rescue medication.    Is a little more worn out today than he was yesterday, but he reports pain has been doing okay.  States he does have some spasm in his back this evening and he is just trying to get some rest.  EJunie Panningis home getting things ready as he reports being told plan is for discharge home tomorrow.  Length of Stay: 11  Current Medications: Scheduled Meds:  . acetaminophen  1,000 mg Oral TID  . amLODipine  10 mg Oral Daily  . atorvastatin  40 mg Oral Daily  . Chlorhexidine Gluconate Cloth  6 each Topical Daily  . collagenase   Topical Daily  . cyclobenzaprine  10 mg Oral BID  . feeding supplement (GLUCERNA SHAKE)  237 mL Oral TID BM  . fentaNYL  1 patch Transdermal Q72H  . gabapentin  200 mg Oral TID  . heparin  5,000 Units Subcutaneous Q8H  . hydrALAZINE  50 mg Oral Q8H  . insulin aspart  0-9 Units Subcutaneous Q6H  . lactulose  20 g Oral BID  . multivitamin with minerals  1 tablet Oral Daily  . mupirocin ointment  1 application Topical BID  . senna-docusate  1 tablet Oral BID  . sodium chloride flush  10-40 mL Intracatheter Q12H  . traZODone  100 mg Oral QHS  . verapamil  120 mg Oral Daily    Continuous Infusions: . sodium chloride 50 mL/hr at 08/02/21 1715  .  ceFAZolin  (ANCEF) IV 2 g (08/02/21 1645)  . chlorproMAZINE (THORAZINE) IV 25 mg (07/31/21 0819)    PRN Meds: albuterol, ALPRAZolam, chlorproMAZINE (THORAZINE) IV, hydrALAZINE, HYDROmorphone (DILAUDID) injection, oxyCODONE, phenol, sodium chloride flush  Physical Exam         Resting in bed Regular work of breathing No distress More awake and  interactive  Abdomen not distended No edema Has pain and swelling, some warmth to R posterior flank area.   Vital Signs: BP 136/69 (BP Location: Left Arm)   Pulse (!) 108   Temp 97.8 F (36.6 C) (Oral)   Resp 18   Ht $R'5\' 11"'Wv$  (1.803 m)   Wt 86.2 kg   SpO2 93%   BMI 26.50 kg/m  SpO2: SpO2: 93 % O2 Device: O2 Device: Room Air O2 Flow Rate: O2 Flow Rate (L/min): 2 L/min  Intake/output summary:  Intake/Output Summary (Last 24 hours) at 08/02/2021 1847 Last data filed at 08/02/2021 0175 Gross per 24 hour  Intake --  Output 900 ml  Net -900 ml    LBM: Last BM Date: 07/31/21 Baseline Weight: Weight: 87.9 kg Most recent weight: Weight: 86.2 kg       Palliative Assessment/Data:      Patient Active Problem List   Diagnosis Date Noted  . Diabetic ulcer of left great toe ([place])   . MSSA bacteremia 07/23/2021  . Constipation 07/23/2021  . Lytic bone lesions on xray 07/22/2021  . AKI (acute kidney injury) ([place]) 05/19/2021  . Anemia 05/19/2021  . Nausea and vomiting 05/19/2021  . Elevated blood pressure reading 05/19/2021  . COVID-19 05/18/2021  . Bilateral carpal tunnel syndrome 01/23/2020  . Numbness 12/12/2019  . Microalbuminuria due to type 2 diabetes mellitus ([place]) 07/20/2019  . GAD (generalized anxiety disorder) 06/18/2019  . Adult ADHD 06/18/2019  . Trigger middle finger of left hand 06/11/2019  . Diabetic retinopathy ([place]) 07/13/2018  . Type 2 diabetes mellitus with diabetic polyneuropathy, without long-term current use of insulin ([place]) 05/17/2018  . Tobacco use disorder 05/17/2018  . Hyperlipidemia 05/17/2018    Palliative Care  Assessment & Plan   Patient Profile:    Assessment:  45 y.o. male  with past medical history of type 2 diabetes with retinopathy, neuropathy, microalbuminemia, history of hypertension dyslipidemia stage III chronic kidney disease, COVID-19 infection in July 2022 admitted on 07/22/2021 with chest pain radiating to bilateral shoulder blades for 6 weeks.    Patient presented to the emergency department and was admitted to hospital medicine service.  Patient was found to have CT chest with lytic bone lesions involving T5 and T6 as well as the sternum.  There was concern for pathologic fracture of T5.  Multiple myeloma is in the differential diagnosis.  Found to have MSSA bacteremia. Patient is being followed by medical oncology and infectious disease.  He was also seen by podiatry because of left hallux ulcer.  Patient was seen by interventional radiology and is to underwent bone marrow biopsy on 07-28-2021.  He continues on antibiotics. Palliative consultation for pain management has been requested.  Recommendations/Plan: Pain: While he still has pain, Mr. Boardley reports pain is tolerable on current regimen.  Discussed likely plan for discharge home tomorrow.  For discharge, would recommend continuing current regimen of fentanyl patch 50 mcg/h with breakthrough oxycodone 10 mg every 3 hours as needed.  We discussed pretreating with medication 30 minutes prior to activities that cause him pain.  We also discussed non-pharmacologic factors to control pain, such as positioning as he has positional and incident related pain.  I agree with PT assessment that he will benefit from hospital bed at discharge as he is better able to complete ADLs, such as feeding, with bed at 30 degree position. Constipation, opioid related:  Continue bowel regimen. Appreciate multi disciplinary team approach and support.   Goals of Care and  Additional Recommendations: Limitations on Scope of Treatment: Full Scope  Treatment  Code Status:    Code Status Orders  (From admission, onward)           Start     Ordered   07/22/21 1845  Full code  Continuous        07/22/21 1844           Code Status History     Date Active Date Inactive Code Status Order ID Comments User Context   05/19/2021 0418 05/21/2021 1847 Full Code [id number]  Tacey Ruiz, MD Inpatient       Discharge Planning: Home with home health  Care plan was discussed with patient and bedside RN.  Thank you for allowing the Palliative Medicine Team to assist in the care of this patient.   Total Time 20 Prolonged Time Billed No  Greater than 50%  of this time was spent counseling and coordinating care related to the above assessment and plan.   Micheline Rough, MD  Please contact Palliative Medicine Team phone at [phone] for questions and concerns.

## 2021-08-02 NOTE — Progress Notes (Signed)
PROGRESS NOTE  MONTERIO BOB NID:[id number] DOB: [birth year] DOA: 07/22/2021 PCP: [name], NP  HPI/Recap of past 24 hours:  This is a 45 year old gentleman with history of diabetes, with retinopathy, with neuropathy, microalbuminuria, hyper tension, hyperlipidemia, chronic kidney disease stage III, COVID-19 infection in July 2022, patient presented to the emergency department for evaluation of abnormal curvature of the spine with elevated blood pressure he was also found to be anemic with hemoglobin of 7.5 D-dimer was elevated but VQ scan was negative for PE and he was found to be having leukocytosis.  CT scan of the chest without contrast revealed lytic bone lesion involving T5 and T6 On there was concern for pathologic fracture of T6 with paraspinal hematoma or tumor or metastatic bone disease oncology was consulted and he was recommended to work-up for multiple myeloma patient was having lots of pain and pain management was started He was also found to have MSSA bacteremia and infectious disease was consulted.  He underwent bone marrow biopsy of T5 which returned to be consistent with osteomyelitis so the results of his work-up did not support diagnosis of multiple myeloma.  Oncology has signed off.  Subjective: July 31, 2021: Patient seen and examined at bedside.  He stated that he is doing much better  August 01, 2021: Patient seen and examined at bedside he stated he did not sleep very much.  He was having some pain in the left chest wall which improved by putting a pillow under the left shoulder. He stated that they are waiting for hospital bed and other things to be set up at home and is supposed to deliver the hospital bed tomorrow to his home He was able to get up and walk with walker  August 02, 2021: Patient seen and examined at bedside.  He denies no other problems today He is waiting for his bed to be delivered about 1:00 PM today. Still complaining of some soreness  in his back.  He stated he uses back brace at home but he places it in his thoracic area that gives him some bracing and it helps he wanted to know if he could do that.  Advice if that helps with his pain he could continue to use the back brace higher up his trunk He continues to receive physical and occupational therapy  Assessment/Plan: Principal Problem:   Lytic bone lesions on xray Active Problems:   Type 2 diabetes mellitus with diabetic polyneuropathy, without long-term current use of insulin (H[facility])   Hyperlipidemia   GAD (generalized anxiety disorder)   AKI (acute kidney injury) ([place])   Anemia   MSSA bacteremia   Constipation   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)  1.-Lytic bone lesion in the setting of MSSA bacteremia CT chest with lytic bone disease involving T5 and T6 is consistent with osteomyelitis not myeloma. ID is involved and he is getting IV antibiotics Plan is been made for him to continue IV antibiotics at home, did outpatient antibiotic therapy order has been signed.  2.  Back pain palliative care involvement for pain management appreciated Continue Xanax Flexeril Tylenol and Neurontin  3.  Acute on chronic kidney disease stage III A Holding ARB, Holding Lasix Creatinine has been stable  4.  Volume overload  No distress seems to be euvolemic now Still holding Lasix due to AKI  5.  Type 2 diabetes mellitus  continue current management with sliding scale insulin Holding his metformin due to AKI  6.  Hypertension  Uncontrolled amlodipine was increased to 10 mg  7.  Hyponatremia.  Is sodium today is 128 Holding IV fluids due to history of volume overload  we will monitor  8.  Decubitus ulcer  continue Santyl dressing  Code Status: Full  Severity of Illness: The appropriate patient status for this patient is INPATIENT. Inpatient status is judged to be reasonable and necessary in order to provide the required intensity of service to ensure the patient's  safety. The patient's presenting symptoms, physical exam findings, and initial radiographic and laboratory data in the context of their chronic comorbidities is felt to place them at high risk for further clinical deterioration. Furthermore, it is not anticipated that the patient will be medically stable for discharge from the hospital within 2 midnights of admission. The following factors support the patient status of inpatient.   " Not ready for discharge  * I certify that at the point of admission it is my clinical judgment that the patient will require inpatient hospital care spanning beyond 2 midnights from the point of admission due to high intensity of service, high risk for further deterioration and high frequency of surveillance required.*   Family Communication: Spoke to his wife Junie Panning discuss about discharge planning  Disposition Plan: Home Status is: Inpatient   Dispo: The patient is from: Home              Anticipated d/c is to: Home              Anticipated d/c date is: Sports coach to discharge: His antibiotic for home infusion will not be available until tomorrow              Patient currently not medically stable for discharge  Consultants: Oncology Palliative care  Procedures: TEE TTE Antimicrobials: Cefazolin  DVT prophylaxis: Subcu heparin   Objective: Vitals:   08/01/21 1316 08/01/21 2143 08/02/21 0616 08/02/21 1330  BP: (!) 145/87 (!) 175/85 (!) 146/91 136/69  Pulse: 100 (!) 105 (!) 101 (!) 108  Resp: 16 18 16 18   Temp: 98.9 F (37.2 C) 98.5 F (36.9 C) 98.2 F (36.8 C) 97.8 F (36.6 C)  TempSrc: Oral Oral Oral Oral  SpO2: 94% 93% 92% 93%  Weight:      Height:        Intake/Output Summary (Last 24 hours) at 08/02/2021 1533 Last data filed at 08/02/2021 0649 Gross per 24 hour  Intake --  Output 900 ml  Net -900 ml    Filed Weights   07/27/21 0622 07/29/21 0500 07/29/21 1136  Weight: 85.7 kg 85.9 kg 86.2 kg   Body mass index is 26.5  kg/m.  Exam:  General: 45 y.o. year-old male well developed well nourished in no acute distress.  Alert and oriented x3. Cardiovascular: Regular rate and rhythm with no rubs or gallops.  No thyromegaly or JVD noted.   Respiratory: Clear to auscultation with no wheezes or rales. Good inspiratory effort. Abdomen: Soft nontender nondistended with normal bowel sounds x4 quadrants. Musculoskeletal: No lower extremity edema. 2/4 pulses in all 4 extremities. Skin: Decubitus ulcer on back Psychiatry: Mood is appropriate for condition and setting Neurology: Patient is able to ambulate with walker    Data Reviewed: CBC: Recent Labs  Lab 07/27/21 0502 07/28/21 0520 07/31/21 0531 08/01/21 1049  WBC 12.8* 12.5* 13.3* 19.5*  NEUTROABS 9.6* 9.3* 9.3*  --   HGB 7.8* 8.4* 8.1* 8.6*  HCT 23.5* 25.2* 24.7* 26.3*  MCV 84.8 84.0 84.6 85.1  PLT  291 358 352 650    Basic Metabolic Panel: Recent Labs  Lab 07/27/21 0502 07/28/21 0520 07/29/21 0517 07/31/21 0531 08/01/21 1049  NA 137 135 138 135 128*  K 4.7 4.0 4.2 4.3 4.3  CL 104 103 103 102 95*  CO2 20* 23 23 24 23   GLUCOSE 158* 134* 138* 253* 218*  BUN 27* 26* 28* 32* 35*  CREATININE 2.09* 1.93* 1.92* 1.73* 2.08*  CALCIUM 9.1 9.0 9.3 9.2 9.0  PHOS  --   --  5.4*  --   --     GFR: Estimated Creatinine Clearance: 46.8 mL/min (A) (by C-G formula based on SCr of 2.08 mg/dL (H)). Liver Function Tests: Recent Labs  Lab 07/29/21 0517  ALBUMIN 2.3*    No results for input(s): LIPASE, AMYLASE in the last 168 hours. No results for input(s): AMMONIA in the last 168 hours. Coagulation Profile: Recent Labs  Lab 07/27/21 1749  INR 1.2    Cardiac Enzymes: No results for input(s): CKTOTAL, CKMB, CKMBINDEX, TROPONINI in the last 168 hours. BNP (last 3 results) No results for input(s): PROBNP in the last 8760 hours. HbA1C: No results for input(s): HGBA1C in the last 72 hours. CBG: Recent Labs  Lab 07/31/21 0727 07/31/21 1358  08/01/21 0730 08/01/21 1452 08/01/21 2018  GLUCAP 229* 244* 223* 235* 282*    Lipid Profile: No results for input(s): CHOL, HDL, LDLCALC, TRIG, CHOLHDL, LDLDIRECT in the last 72 hours. Thyroid Function Tests: No results for input(s): TSH, T4TOTAL, FREET4, T3FREE, THYROIDAB in the last 72 hours. Anemia Panel: No results for input(s): VITAMINB12, FOLATE, FERRITIN, TIBC, IRON, RETICCTPCT in the last 72 hours. Urine analysis:    Component Value Date/Time   COLORURINE STRAW (A) 07/22/2021 1807   APPEARANCEUR CLEAR 07/22/2021 1807   APPEARANCEUR Clear 05/05/2018 1211   LABSPEC 1.015 07/22/2021 1807   PHURINE 6.0 07/22/2021 1807   GLUCOSEU NEGATIVE 07/22/2021 1807   HGBUR TRACE (A) 07/22/2021 1807   BILIRUBINUR NEGATIVE 07/22/2021 1807   BILIRUBINUR negative 09/12/2018 0919   BILIRUBINUR Negative 05/05/2018 1211   KETONESUR NEGATIVE 07/22/2021 1807   PROTEINUR 100 (A) 07/22/2021 1807   UROBILINOGEN 1.0 09/12/2018 0919   NITRITE NEGATIVE 07/22/2021 1807   LEUKOCYTESUR NEGATIVE 07/22/2021 1807   Sepsis Labs: @LABRCNTIP (procalcitonin:4,lacticidven:4)  ) Recent Results (from the past 240 hour(s))  Culture, blood (routine x 2)     Status: None   Collection Time: 07/24/21  5:04 PM   Specimen: BLOOD  Result Value Ref Range Status   Specimen Description   Final    BLOOD RIGHT ANTECUBITAL Performed at [facility], [place] [address] [place], [place] [address]    Special Requests   Final    BOTTLES DRAWN AEROBIC AND ANAEROBIC Blood Culture adequate volume Performed at [place] [address] [place], [place] [address]    Culture   Final    NO GROWTH 5 DAYS Performed at [facility], [place] [address], [place], [place] [address]    Report Status 07/29/2021 FINAL  Final  Culture, blood (routine x 2)     Status: None   Collection Time: 07/24/21  5:04 PM   Specimen: BLOOD  Result Value Ref Range Status   Specimen Description   Final     BLOOD LEFT ANTECUBITAL Performed at [facility] [address] [place], [place] [address]    Special Requests   Final    BOTTLES DRAWN AEROBIC AND ANAEROBIC Blood Culture adequate volume Performed  at [facility], [place] [address] [place], [place] [address]    Culture   Final    NO GROWTH 5 DAYS Performed at [facility], [place] [address], [place], [place] [address]    Report Status 07/29/2021 FINAL  Final      Studies: No results found.  Scheduled Meds:  acetaminophen  1,000 mg Oral TID   amLODipine  10 mg Oral Daily   atorvastatin  40 mg Oral Daily   Chlorhexidine Gluconate Cloth  6 each Topical Daily   collagenase   Topical Daily   cyclobenzaprine  10 mg Oral BID   feeding supplement (GLUCERNA SHAKE)  237 mL Oral TID BM   fentaNYL  1 patch Transdermal Q72H   gabapentin  200 mg Oral TID   heparin  5,000 Units Subcutaneous Q8H   hydrALAZINE  50 mg Oral Q8H   insulin aspart  0-9 Units Subcutaneous Q6H   lactulose  20 g Oral BID   multivitamin with minerals  1 tablet Oral Daily   mupirocin ointment  1 application Topical BID   senna-docusate  1 tablet Oral BID   sodium chloride flush  10-40 mL Intracatheter Q12H   traZODone  100 mg Oral QHS   verapamil  120 mg Oral Daily    Continuous Infusions:  sodium chloride      ceFAZolin (ANCEF) IV 2 g (08/02/21 0617)   chlorproMAZINE (THORAZINE) IV 25 mg (07/31/21 0819)     LOS: 11 days     Cristal Deer, MD [facility]  To reach me or the doctor on call, go to: [email or web address] Password [facility]  08/02/2021, 3:33 PM

## 2021-08-02 NOTE — Progress Notes (Signed)
Patient refusing to walk for oxygen saturation check, He states he has muscle spasms and cannot walk , Dr Kyung Bacca notified. Will try again another time.

## 2021-08-03 ENCOUNTER — Other Ambulatory Visit: Payer: Self-pay | Admitting: Hospice and Palliative Medicine

## 2021-08-03 DIAGNOSIS — Z515 Encounter for palliative care: Secondary | ICD-10-CM | POA: Diagnosis not present

## 2021-08-03 DIAGNOSIS — M899 Disorder of bone, unspecified: Secondary | ICD-10-CM | POA: Diagnosis not present

## 2021-08-03 DIAGNOSIS — M546 Pain in thoracic spine: Secondary | ICD-10-CM | POA: Diagnosis not present

## 2021-08-03 DIAGNOSIS — R7881 Bacteremia: Secondary | ICD-10-CM | POA: Diagnosis not present

## 2021-08-03 LAB — GLUCOSE, CAPILLARY
Glucose-Capillary: 276 mg/dL — ABNORMAL HIGH (ref 70–99)
Glucose-Capillary: 275 mg/dL — ABNORMAL HIGH (ref 70–99)
Glucose-Capillary: 261 mg/dL — ABNORMAL HIGH (ref 70–99)
Glucose-Capillary: 201 mg/dL — ABNORMAL HIGH (ref 70–99)
Glucose-Capillary: 172 mg/dL — ABNORMAL HIGH (ref 70–99)
Glucose-Capillary: 196 mg/dL — ABNORMAL HIGH (ref 70–99)
Glucose-Capillary: 197 mg/dL — ABNORMAL HIGH (ref 70–99)

## 2021-08-03 LAB — BASIC METABOLIC PANEL
Anion gap: 6 (ref 5–15)
BUN: 40 mg/dL — ABNORMAL HIGH (ref 6–20)
CO2: 25 mmol/L (ref 22–32)
Calcium: 9.2 mg/dL (ref 8.9–10.3)
Chloride: 103 mmol/L (ref 98–111)
Creatinine, Ser: 2.2 mg/dL — ABNORMAL HIGH (ref 0.61–1.24)
GFR, Estimated: 36 mL/min — ABNORMAL LOW (ref >60)
Glucose, Bld: 212 mg/dL — ABNORMAL HIGH (ref 70–99)
Potassium: 4.8 mmol/L (ref 3.5–5.1)
Sodium: 134 mmol/L — ABNORMAL LOW (ref 135–145)

## 2021-08-03 MED ORDER — GLUCERNA SHAKE PO LIQD: 237.0000 mL | Freq: Three times a day (TID) | ORAL | 0 refills | Status: DC

## 2021-08-03 MED ORDER — LACTULOSE 10 GM/15ML PO SOLN
20.0000 g | Freq: Two times a day (BID) | ORAL | 0 refills | Status: DC
Start: 2021-08-03 — End: 2021-08-30

## 2021-08-03 MED ORDER — HYDRALAZINE HCL 50 MG PO TABS: 50.0000 mg | ORAL_TABLET | Freq: Three times a day (TID) | ORAL | 0 refills | Status: DC

## 2021-08-03 MED ORDER — CYCLOBENZAPRINE HCL 10 MG PO TABS: 10.0000 mg | ORAL_TABLET | Freq: Three times a day (TID) | ORAL | Status: DC

## 2021-08-03 MED ORDER — CYCLOBENZAPRINE HCL 10 MG PO TABS
10.0000 mg | ORAL_TABLET | Freq: Three times a day (TID) | ORAL | 0 refills | Status: DC
Start: 2021-08-03 — End: 2021-08-29

## 2021-08-03 MED ORDER — SENNOSIDES-DOCUSATE SODIUM 8.6-50 MG PO TABS: 1.0000 | ORAL_TABLET | Freq: Two times a day (BID) | ORAL | 0 refills | Status: DC

## 2021-08-03 MED ORDER — AMLODIPINE BESYLATE 10 MG PO TABS: 10.0000 mg | ORAL_TABLET | Freq: Every day | ORAL | 0 refills | Status: DC

## 2021-08-03 MED ORDER — COLLAGENASE 250 UNIT/GM EX OINT: TOPICAL_OINTMENT | Freq: Every day | CUTANEOUS | 0 refills | Status: DC

## 2021-08-03 MED ORDER — CHLORPROMAZINE HCL 25 MG PO TABS: 25.0000 mg | ORAL_TABLET | Freq: Three times a day (TID) | ORAL | 0 refills | Status: DC | PRN

## 2021-08-03 MED ORDER — MUPIROCIN 2 % EX OINT: 1.0000 "application " | TOPICAL_OINTMENT | Freq: Two times a day (BID) | CUTANEOUS | 0 refills | Status: DC

## 2021-08-03 NOTE — Progress Notes (Signed)
Daily Progress Note   Patient Name: Jose [name]       Date: 08/03/2021 DOB: June 04, 1976  Age: 45 y.o. MRN#: [id number] Attending Physician: Cristal Deer, MD Primary Care Physician: [name], NP Admit Date: 07/22/2021  Reason for Consultation/Follow-up: Non pain symptom management and Pain control  Subjective: I met today with Mr. Jose [name].  His wife was at the bedside.  She reports he is still having a lot of muscle spasm that triggers his other pain.  We discussed increasing Flexeril.  We also discussed that while I agree this is likely related to muscle spasm, there is need to keep a close eye on if he is having any worsening of symptoms as jerking could also be sign of medication side effect.  Overall, plan is to discharge home today.  She has arranged for hospital bed to be in the home.  Length of Stay: 12  Current Medications: Scheduled Meds:  . acetaminophen  1,000 mg Oral TID  . amLODipine  10 mg Oral Daily  . atorvastatin  40 mg Oral Daily  . Chlorhexidine Gluconate Cloth  6 each Topical Daily  . collagenase   Topical Daily  . cyclobenzaprine  10 mg Oral TID  . feeding supplement (GLUCERNA SHAKE)  237 mL Oral TID BM  . fentaNYL  1 patch Transdermal Q72H  . gabapentin  200 mg Oral TID  . heparin  5,000 Units Subcutaneous Q8H  . hydrALAZINE  50 mg Oral Q8H  . insulin aspart  0-9 Units Subcutaneous Q6H  . lactulose  20 g Oral BID  . multivitamin with minerals  1 tablet Oral Daily  . mupirocin ointment  1 application Topical BID  . senna-docusate  1 tablet Oral BID  . sodium chloride flush  10-40 mL Intracatheter Q12H  . traZODone  100 mg Oral QHS  . verapamil  120 mg Oral Daily    Continuous Infusions: . sodium chloride 50 mL/hr at 08/02/21 1715  .  ceFAZolin  (ANCEF) IV 2 g (08/03/21 0546)  . chlorproMAZINE (THORAZINE) IV 25 mg (07/31/21 0819)    PRN Meds: albuterol, ALPRAZolam, chlorproMAZINE (THORAZINE) IV, hydrALAZINE, HYDROmorphone (DILAUDID) injection, oxyCODONE, phenol, sodium chloride flush  Physical Exam         Resting in bed Regular work of breathing No distress More awake and interactive  Abdomen not distended No edema Has pain and swelling, some warmth to R posterior flank area.   Vital Signs: BP (!) 180/92 (BP Location: Left Arm)   Pulse (!) 108   Temp 98.3 F (36.8 C) (Oral)   Resp 18   Ht _0  (1.803 m)   Wt 86.2 kg   SpO2 93%   BMI 26.50 kg/m  SpO2: SpO2: 93 % O2 Device: O2 Device: Room Air O2 Flow Rate: O2 Flow Rate (L/min): 2 L/min  Intake/output summary: No intake or output data in the 24 hours ending 08/03/21 1043  LBM: Last BM Date: 08/01/21 Baseline Weight: Weight: 87.9 kg Most recent weight: Weight: 86.2 kg       Palliative Assessment/Data:      Patient Active Problem List   Diagnosis Date Noted  . Diabetic ulcer of left great toe ([place])   . MSSA bacteremia 07/23/2021  . Constipation 07/23/2021  . Lytic bone lesions on xray 07/22/2021  . AKI (acute kidney injury) ([place]) 05/19/2021  . Anemia 05/19/2021  . Nausea and vomiting 05/19/2021  . Elevated blood pressure reading 05/19/2021  . COVID-19 05/18/2021  . Bilateral carpal tunnel syndrome 01/23/2020  . Numbness 12/12/2019  . Microalbuminuria due to type 2 diabetes mellitus ([place]) 07/20/2019  . GAD (generalized anxiety disorder) 06/18/2019  . Adult ADHD 06/18/2019  . Trigger middle finger of left hand 06/11/2019  . Diabetic retinopathy ([place]) 07/13/2018  . Type 2 diabetes mellitus with diabetic polyneuropathy, without long-term current use of insulin ([place]) 05/17/2018  . Tobacco use disorder 05/17/2018  . Hyperlipidemia 05/17/2018    Palliative Care Assessment & Plan   Patient Profile:    Assessment:  45 y.o. male  with past medical  history of type 2 diabetes with retinopathy, neuropathy, microalbuminemia, history of hypertension dyslipidemia stage III chronic kidney disease, COVID-19 infection in July 2022 admitted on 07/22/2021 with chest pain radiating to bilateral shoulder blades for 6 weeks.    Patient presented to the emergency department and was admitted to hospital medicine service.  Patient was found to have CT chest with lytic bone lesions involving T5 and T6 as well as the sternum.  There was concern for pathologic fracture of T5.  Multiple myeloma is in the differential diagnosis.  Found to have MSSA bacteremia. Patient is being followed by medical oncology and infectious disease.  He was also seen by podiatry because of left hallux ulcer.  Patient was seen by interventional radiology and is to underwent bone marrow biopsy on 07-28-2021.  He continues on antibiotics. Palliative consultation for pain management has been requested.  Recommendations/Plan: Pain: While he still has pain, Jose [name] reports pain is tolerable on current regimen.  For discharge, would recommend continuing current regimen of fentanyl patch 50 mcg/h with breakthrough oxycodone 10 mg every 3 hours as needed.  We have also discussed pretreating with medication 30 minutes prior to activities that cause him pain.  We also discussed non-pharmacologic factors to control pain, such as positioning as he has positional and incident related pain.  I agree with PT assessment that he will benefit from hospital bed at discharge as he is better able to complete ADLs, such as feeding, with bed at 30 degree position. Muscle spasm: Plan to increase Flexeril to 3 times daily.  Advised to keep close eye on symptoms as there is also concern if he is having myoclonic jerks versus spasm that this could potentially be side effect of other medications. Constipation, opioid related:  Continue bowel  regimen. Appreciate multi disciplinary team approach and support.   Goals of  Care and Additional Recommendations: Limitations on Scope of Treatment: Full Scope Treatment  Code Status:    Code Status Orders  (From admission, onward)           Start     Ordered   07/22/21 1845  Full code  Continuous        07/22/21 1844           Code Status History     Date Active Date Inactive Code Status Order ID Comments User Context   05/19/2021 0418 05/21/2021 1847 Full Code [id number]  Tacey Ruiz, MD Inpatient       Discharge Planning: Home with home health  Care plan was discussed with patient and bedside RN.  Thank you for allowing the Palliative Medicine Team to assist in the care of this patient.   Total Time 30 Prolonged Time Billed No   Greater than 50%  of this time was spent counseling and coordinating care related to the above assessment and plan.  Micheline Rough, MD  Please contact Palliative Medicine Team phone at [phone] for questions and concerns.

## 2021-08-03 NOTE — Discharge Summary (Signed)
Discharge Summary  Jose [name] ZRA:[id number] DOB: Oct 30, 1975  PCP: [name], NP  Admit date: 07/22/2021 Discharge date: 08/03/2021  Time spent: 39 minutes  Recommendations for Outpatient Follow-up:  Home with  home health  Discharge Diagnoses:  Active Hospital Problems   Diagnosis Date Noted   Lytic bone lesions on xray 07/22/2021   Diabetic ulcer of left great toe ([place])    MSSA bacteremia 07/23/2021   Constipation 07/23/2021   Anemia 05/19/2021   AKI (acute kidney injury) ([place]) 05/19/2021   GAD (generalized anxiety disorder) 06/18/2019   Hyperlipidemia 05/17/2018   Type 2 diabetes mellitus with diabetic polyneuropathy, without long-term current use of insulin ([place]) 05/17/2018    Resolved Hospital Problems  No resolved problems to display.    Discharge Condition: Improved  Diet recommendation: Regular  Vitals:   08/02/21 2315 08/03/21 0619  BP: (!) 155/84 (!) 180/92  Pulse: 99 (!) 108  Resp:    Temp: 99.1 F (37.3 C) 98.3 F (36.8 C)  SpO2: 92% 93%    History of present illness: This is a 45 year old gentleman with history of diabetes, with retinopathy, with neuropathy, microalbuminuria, hyper tension, hyperlipidemia, chronic kidney disease stage III, COVID-19 infection in July 2022, patient presented to the emergency department for evaluation of abnormal curvature of the spine with elevated blood pressure he was also found to be anemic with hemoglobin of 7.5 D-dimer was elevated but VQ scan was negative for PE and he was found to be having leukocytosis.  CT scan of the chest without contrast revealed lytic bone lesion involving T5 and T6 On there was concern for pathologic fracture of T6 with paraspinal hematoma or tumor or metastatic bone disease oncology was consulted and he was recommended to work-up for multiple myeloma patient was having lots of pain and pain management was started He was also found to have MSSA bacteremia and infectious disease was  consulted.  He underwent bone marrow biopsy of T5 which returned to be consistent with osteomyelitis so the results of his work-up did not support diagnosis of multiple myeloma.  Oncology has signed off.  He was discharged home with outpatient antibiotics with end date of September 04, 2021  Hospital Course:  Principal Problem:   Lytic bone lesions on xray Active Problems:   Type 2 diabetes mellitus with diabetic polyneuropathy, without long-term current use of insulin ([facility])   Hyperlipidemia   GAD (generalized anxiety disorder)   AKI (acute kidney injury) ([place])   Anemia   MSSA bacteremia   Constipation   Diabetic ulcer of left great toe [facility])  This is a 45 year old gentleman with history of diabetes, with retinopathy, with neuropathy, microalbuminuria, hyper tension, hyperlipidemia, chronic kidney disease stage III, COVID-19 infection in July 2022, patient presented to the emergency department for evaluation of abnormal curvature of the spine with elevated blood pressure he was also found to be anemic with hemoglobin of 7.5 D-dimer was elevated but VQ scan was negative for PE and he was found to be having leukocytosis.  CT scan of the chest without contrast revealed lytic bone lesion involving T5 and T6 On there was concern for pathologic fracture of T6 with paraspinal hematoma or tumor or metastatic bone disease oncology was consulted and he was recommended to work-up for multiple myeloma patient was having lots of pain and pain management was started He was also found to have MSSA bacteremia and infectious disease was consulted.  He underwent bone marrow biopsy of T5 which returned to be consistent with  osteomyelitis so the results of his work-up did not support diagnosis of multiple myeloma.  Oncology has signed off. His blood pressure was uncontrolled so he is amlodipine was increased to 10 mg Due to AKI his metformin was on hold as well as Lasix and Cozaar which is the ARB. His sodium was as  low as 128 but he received gentle IV hydration with improvement of his sodium he had decubitus ulcer which was being dressed with Santyl   Procedures: TEE TTE  Consultations: Oncology Palliative care  Discharge Exam: BP (!) 180/92 (BP Location: Left Arm)   Pulse (!) 108   Temp 98.3 F (36.8 C) (Oral)   Resp 18   Ht 5' 11"  (1.803 m)   Wt 86.2 kg   SpO2 93%   BMI 26.50 kg/m   General: Alert oriented x3 no distress Cardiovascular: Regular rate and rhythm Respiratory: Clear to to auscultation bilaterally  Discharge Instructions You were cared for by a hospitalist during your hospital stay. If you have any questions about your discharge medications or the care you received while you were in the hospital after you are discharged, you can call the unit and asked to speak with the hospitalist on call if the hospitalist that took care of you is not available. Once you are discharged, your primary care physician will handle any further medical issues. Please note that NO REFILLS for any discharge medications will be authorized once you are discharged, as it is imperative that you return to your primary care physician (or establish a relationship with a primary care physician if you do not have one) for your aftercare needs so that they can reassess your need for medications and monitor your lab values.  Discharge Instructions     [facility] pharmacist to adjust dose for Vancomycin, Aminoglycosides and other anti-infective therapies as requested by physician.   Complete by: As directed    [facility] to provide Cath Flo 60m   Complete by: As directed    Administer for PICC line occlusion and as ordered by physician for other access device issues.   Anaphylaxis Kit: Provided to treat any anaphylactic reaction to the medication being provided to the patient if First Dose or when requested by physician   Complete by: As directed    Epinephrine 172mml vial / amp:  Administer 0.103m37m0.103ml24mubcutaneously once for moderate to severe anaphylaxis, nurse to call physician and pharmacy when reaction occurs and call 911 if needed for immediate care   Diphenhydramine 50mg48mIV vial: Administer 25-50mg 47mM PRN for first dose reaction, rash, itching, mild reaction, nurse to call physician and pharmacy when reaction occurs   Sodium Chloride 0.9% NS 500ml I56mdminister if needed for hypovolemic blood pressure drop or as ordered by physician after call to physician with anaphylactic reaction   Call MD for:  difficulty breathing, headache or visual disturbances   Complete by: As directed    Call MD for:  persistant dizziness or light-headedness   Complete by: As directed    Call MD for:  severe uncontrolled pain   Complete by: As directed    Call MD for:  temperature >100.4   Complete by: As directed    Change dressing on IV access line weekly and PRN   Complete by: As directed    Diet - low sodium heart healthy   Complete by: As directed    Discharge instructions   Complete by: As directed    Follow-up with primary  care doctor in 1 to 2 weeks to adjust his blood pressure medications His Lasix and Cozaar on hold as well as his metformin on hold because of his kidneys so he needs to follow-up with his doctor to recheck his kidney function and know when to restart these medicines if needed   Discharge wound care:   Complete by: As directed    As per hospital order please see wound care orders with Santyl Please follow-up with primary care for further wound care orders   Flush IV access with Sodium Chloride 0.9% and Heparin 10 units/ml or 100 units/ml   Complete by: As directed    Home infusion instructions - [facility]   Complete by: As directed    Instructions: Flush IV access with Sodium Chloride 0.9% and Heparin 10units/ml or 100units/ml   Change dressing on IV access line: Weekly and PRN   Instructions Cath Flo 41m: Administer for PICC Line  occlusion and as ordered by physician for other access device   [facility] pharmacist to adjust dose for: Vancomycin, Aminoglycosides and other anti-infective therapies as requested by physician   Increase activity slowly   Complete by: As directed    Method of administration may be changed at the discretion of home infusion pharmacist based upon assessment of the patient and/or caregiver's ability to self-administer the medication ordered   Complete by: As directed    Outpatient Parenteral Antibiotic Therapy Information Antibiotic: Cefazolin (Ancef) IVPB; Indications for use: +MSSA.OSTEOMYLITIS; End Date: 09/04/2021   Complete by: As directed    Antibiotic: Cefazolin (Ancef) IVPB   Indications for use: +MSSA.OSTEOMYLITIS   End Date: 09/04/2021      Allergies as of 08/03/2021   No Known Allergies      Medication List     STOP taking these medications    amphetamine-dextroamphetamine 20 MG tablet Commonly known as: Adderall   furosemide 20 MG tablet Commonly known as: LASIX   losartan 50 MG tablet Commonly known as: COZAAR   metFORMIN 500 MG 24 hr tablet Commonly known as: GLUCOPHAGE-XR   verapamil 120 MG CR tablet Commonly known as: CALAN-SR       TAKE these medications    acetaminophen 500 MG tablet Commonly known as: TYLENOL Take 1,000 mg by mouth every 6 (six) hours as needed for mild pain, fever or headache.   albuterol 108 (90 Base) MCG/ACT inhaler Commonly known as: VENTOLIN HFA Inhale 1 puff into the lungs every 4 (four) hours as needed for wheezing or shortness of breath.   ALPRAZolam 0.25 MG tablet Commonly known as: XANAX Take 0.25 mg by mouth 2 (two) times daily as needed for anxiety.   amLODipine 10 MG tablet Commonly known as: NORVASC Take 1 tablet (10 mg total) by mouth daily. What changed:  medication strength how much to take   atorvastatin 40 MG tablet Commonly known as: LIPITOR Take 1 tablet (40 mg total) by mouth daily.    ceFAZolin  IVPB Commonly known as: ANCEF Inject 2 g into the vein every 8 (eight) hours. Indication:  Osteomyelitis  First Dose: No Last Day of Therapy:  09/04/2022  Labs - Once weekly:  CBC/D and BMP, Labs - Every other week:  ESR and CRP Method of administration: IV Push Method of administration may be changed at the discretion of home infusion pharmacist based upon assessment of the patient and/or caregiver's ability to self-administer the medication ordered.   chlorproMAZINE 25 MG tablet Commonly known as: THORAZINE Take 1 tablet (  25 mg total) by mouth 3 (three) times daily as needed for hiccoughs.   collagenase ointment Commonly known as: SANTYL Apply topically daily.   cyclobenzaprine 10 MG tablet Commonly known as: FLEXERIL Take 10 mg by mouth 2 (two) times daily.   DULoxetine 60 MG capsule Commonly known as: CYMBALTA TAKE 1 CAPSULE(60 MG) BY MOUTH DAILY   feeding supplement (GLUCERNA SHAKE) Liqd Take 237 mLs by mouth 3 (three) times daily between meals.   gabapentin 300 MG capsule Commonly known as: NEURONTIN TAKE 1 TO 2 CAPSULES(300 TO 600 MG) BY MOUTH AT BEDTIME What changed: See the new instructions.   glimepiride 1 MG tablet Commonly known as: AMARYL Take 1 tablet (1 mg total) by mouth daily with breakfast.   hydrALAZINE 50 MG tablet Commonly known as: APRESOLINE Take 1 tablet (50 mg total) by mouth 3 (three) times daily.   HYDROcodone-acetaminophen 5-325 MG tablet Commonly known as: NORCO/VICODIN Take 1 tablet by mouth 2 (two) times daily as needed for severe pain.   lactulose 10 GM/15ML solution Commonly known as: CHRONULAC Take 30 mLs (20 g total) by mouth 2 (two) times daily.   Lucentis 0.3 MG/0.05ML Soln Generic drug: Ranibizumab 1 Dose by Intravitreal route every 3 (three) months.   MULTIVITAMIN ADULT PO Take 1 tablet by mouth daily.   mupirocin ointment 2 % Commonly known as: BACTROBAN Apply 1 application topically 2 (two) times daily.    oxyCODONE 5 MG immediate release tablet Commonly known as: Oxy IR/ROXICODONE Take 5 mg by mouth 2 (two) times daily as needed for pain or severe pain.   senna-docusate 8.6-50 MG tablet Commonly known as: Senokot-S Take 1 tablet by mouth 2 (two) times daily.   tadalafil 20 MG tablet Commonly known as: CIALIS TK 1 T PO  PO QD PRN FOR ERECTILE DYSFUNCTION What changed:  how much to take how to take this when to take this reasons to take this additional instructions   tobramycin 0.3 % ophthalmic solution Commonly known as: TOBREX Place 1 drop into both eyes See admin instructions. Begin 1 day prior to treatment and continue the day of treatment and for one full day after treatment.   traZODone 100 MG tablet Commonly known as: DESYREL TAKE 1 TABLET(100 MG) BY MOUTH AT BEDTIME AS NEEDED FOR SLEEP What changed:  how much to take how to take this when to take this additional instructions   Trulicity 3 BT/5.9RC Sopn Generic drug: Dulaglutide Inject 3 mg as directed once a week.   VITAMIN C PO Take 1 tablet by mouth daily.   VITAMIN D PO Take 1 tablet by mouth daily.               Durable Medical Equipment  (From admission, onward)           Start     Ordered   07/30/21 1345  For home use only DME 3 n 1  Once        07/30/21 1344   07/30/21 1345  For home use only DME Walker rolling  Once       Question Answer Comment  Walker: With 5 Inch Wheels   Patient needs a walker to treat with the following condition Weakness      07/30/21 1344              Discharge Care Instructions  (From admission, onward)           Start     Ordered   08/03/21 0000  Discharge wound care:       Comments: As per hospital order please see wound care orders with Santyl Please follow-up with primary care for further wound care orders   08/03/21 0942   07/31/21 0000  Change dressing on IV access line weekly and PRN  (Home infusion instructions - [facility]  )        07/31/21 1312           No Known Allergies    The results of significant diagnostics from this hospitalization (including imaging, microbiology, ancillary and laboratory) are listed below for reference.    Significant Diagnostic Studies: CT ABDOMEN PELVIS WO CONTRAST  Result Date: 07/23/2021 CLINICAL DATA:  Cancer of unknown primary, lytic thoracic spine lesions EXAM: CT ABDOMEN AND PELVIS WITHOUT CONTRAST TECHNIQUE: Multidetector CT imaging of the abdomen and pelvis was performed following the standard protocol without IV contrast. Unenhanced CT was performed per clinician order. Lack of IV contrast limits sensitivity and specificity, especially for evaluation of abdominal/pelvic solid viscera. COMPARISON:  07/22/2021 FINDINGS: Lower chest: Hypoventilatory changes at the dependent lower lobes. No acute pleural or parenchymal lung disease. Hepatobiliary: No gross liver abnormalities on this unenhanced exam. Gallbladder is moderately distended without cholelithiasis or cholecystitis. Pancreas: Unremarkable. No pancreatic ductal dilatation or surrounding inflammatory changes. Spleen: Normal in size without focal abnormality. Adrenals/Urinary Tract: No urinary tract calculi or obstructive uropathy. Excreted contrast within the bladder consistent with preceding MRI. The adrenals are unremarkable. Stomach/Bowel: No bowel obstruction or ileus. Moderate retained stool throughout the colon. The appendix is surgically absent. No bowel wall thickening or inflammatory change. Vascular/Lymphatic: Aortic atherosclerosis. No enlarged abdominal or pelvic lymph nodes. Reproductive: Prostate is unremarkable. Other: No free fluid or free gas. Small fat containing bilateral inguinal hernias. No bowel herniation. Musculoskeletal: There are no acute or destructive bony lesions. Reconstructed images demonstrate no additional findings. IMPRESSION: 1. No acute intra-abdominal or intrapelvic process. 2. Moderate  fecal retention consistent with constipation. 3.  Aortic Atherosclerosis (ICD10-I70.0). Electronically Signed   By: Randa Ngo M.D.   On: 07/23/2021 22:26   DG Chest 2 View  Result Date: 07/22/2021 CLINICAL DATA:  chest pain EXAM: CHEST - 2 VIEW COMPARISON:  None. FINDINGS: The cardiomediastinal silhouette is within normal limits. No pleural effusion. No pneumothorax. No lobar consolidation. Minimal left base subsegmental atelectasis. No acute osseous abnormality. IMPRESSION: Minimal left lung base subsegmental atelectasis. No other acute abnormality in the chest. Electronically Signed   By: Albin Felling M.D.   On: 07/22/2021 11:05   CT HEAD WO CONTRAST (5MM)  Result Date: 07/27/2021 CLINICAL DATA:  Dizziness EXAM: CT HEAD WITHOUT CONTRAST TECHNIQUE: Contiguous axial images were obtained from the base of the skull through the vertex without intravenous contrast. COMPARISON:  None. FINDINGS: Brain: No evidence of acute infarction, hemorrhage, hydrocephalus, extra-axial collection or mass lesion/mass effect. Vascular: No hyperdense vessel or unexpected calcification. Skull: Normal. Negative for fracture or focal lesion. Sinuses/Orbits: No acute finding. Other: None. IMPRESSION: No acute intracranial abnormality noted. Electronically Signed   By: Inez Catalina M.D.   On: 07/27/2021 20:55   CT Chest Wo Contrast  Result Date: 07/22/2021 CLINICAL DATA:  Chest pain EXAM: CT CHEST WITHOUT CONTRAST TECHNIQUE: Multidetector CT imaging of the chest was performed following the standard protocol without IV contrast. COMPARISON:  None. FINDINGS: Cardiovascular: The heart is within normal limits in size. No pericardial effusion the aorta is normal in caliber. Minimal scattered atherosclerotic calcifications. Age advanced coronary artery calcifications. Mediastinum/Nodes: Small scattered mediastinal  and hilar lymph nodes but no mass or overt adenopathy. Borderline enlarged subcarinal node measuring 12 mm could be  related to the thoracic spine process. The esophagus is grossly normal. Lungs/Pleura: Areas of streaky bibasilar atelectasis. No focal airspace consolidation/pneumonia. No worrisome pulmonary lesions. No pleural effusions or pleural lesions. Upper Abdomen: No significant upper abdominal findings. Musculoskeletal: No chest wall mass, supraclavicular or axillary adenopathy. There are several small bilateral thyroid nodules. None of these measures greater than 12 mm. Not clinically significant; no follow-up imaging recommended (ref: J Am Coll Radiol. 2015 Feb;12(2): 143-50). There are lytic destructive bone lesions involving the T5 and T6 vertebral bodies with a mild pathologic compression fracture of T6. Paraspinal hematoma or tumor noted at both these levels. No definite findings for spinal canal compromise. Mild lucency involving the sternum could also reflect lytic bone disease. I do not see any obvious rib lesions or pathologic rib fractures. IMPRESSION: 1. 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 There is a pathologic fracture of T6 with paraspinal hematoma or tumor. This could represent metastatic bone disease or possibly myeloma. 2. No obvious primary neoplasm involving the chest or upper abdomen. PET-CT may be helpful for further evaluation and extent of the bone disease and possibly detecting a primary neoplasm. 3. Thoracic spine MRI without and with contrast may be helpful for further evaluation of the thoracic disease. I do not see any any obvious spinal canal compromise. Aortic Atherosclerosis (ICD10-I70.0). Electronically Signed   By: Marijo Sanes M.D.   On: 07/22/2021 16:51   MR THORACIC SPINE W WO CONTRAST  Result Date: 07/23/2021 CLINICAL DATA:  Bone lesion, T-spine, malignancy suspected; Bone lesion, L/S-spine, malignancy suspected EXAM: MRI THORACIC AND LUMBAR SPINE WITHOUT AND WITH CONTRAST TECHNIQUE: Multiplanar and multiecho pulse sequences of the thoracic and lumbar  spine were obtained without and with intravenous contrast. CONTRAST:  33m GADAVIST GADOBUTROL 1 MMOL/ML IV SOLN COMPARISON:  None. FINDINGS: MRI THORACIC SPINE FINDINGS Alignment:  Physiologic. Vertebrae: There is abnormal bone marrow signal and contrast enhancement at T4 and T5. There is mild edema of the disc space. There is a minimally depressed fracture of the superior T5 endplate. Cord:  Normal signal and morphology. Paraspinal and other soft tissues: There is phlegmon like contrast enhancement of the prevertebral soft tissues at the T4 and T5 levels. No epidural abscess. Disc levels: There is no spinal canal or neural foraminal stenosis. MRI LUMBAR SPINE FINDINGS Segmentation:  Standard. Alignment:  Physiologic. Vertebrae:  No fracture, evidence of discitis, or bone lesion. Conus medullaris: Extends to the L1 level and appears normal. Paraspinal and other soft tissues: Negative. Disc levels: No spinal canal or neural foraminal stenosis. IMPRESSION: 1. Abnormal bone marrow signal and contrast enhancement at the T4 and T5 levels with mild edema of the disc space and prevertebral phlegmon/hematoma. The findings could indicate osseous metastatic disease, multiple myeloma or discitis-osteomyelitis. 2. Minimally depressed compression fracture of T5 without retropulsion. 3. No epidural abscess. 4. No spinal canal or neural foraminal stenosis. Electronically Signed   By: KUlyses JarredM.D.   On: 07/23/2021 19:55   MR Lumbar Spine W Wo Contrast  Result Date: 07/23/2021 CLINICAL DATA:  Bone lesion, T-spine, malignancy suspected; Bone lesion, L/S-spine, malignancy suspected EXAM: MRI THORACIC AND LUMBAR SPINE WITHOUT AND WITH CONTRAST TECHNIQUE: Multiplanar and multiecho pulse sequences of the thoracic and lumbar spine were obtained without and with intravenous contrast. CONTRAST:  174mGADAVIST GADOBUTROL 1 MMOL/ML IV SOLN COMPARISON:  None. FINDINGS: MRI  THORACIC SPINE FINDINGS Alignment:  Physiologic. Vertebrae:  There is abnormal bone marrow signal and contrast enhancement at T4 and T5. There is mild edema of the disc space. There is a minimally depressed fracture of the superior T5 endplate. Cord:  Normal signal and morphology. Paraspinal and other soft tissues: There is phlegmon like contrast enhancement of the prevertebral soft tissues at the T4 and T5 levels. No epidural abscess. Disc levels: There is no spinal canal or neural foraminal stenosis. MRI LUMBAR SPINE FINDINGS Segmentation:  Standard. Alignment:  Physiologic. Vertebrae:  No fracture, evidence of discitis, or bone lesion. Conus medullaris: Extends to the L1 level and appears normal. Paraspinal and other soft tissues: Negative. Disc levels: No spinal canal or neural foraminal stenosis. IMPRESSION: 1. Abnormal bone marrow signal and contrast enhancement at the T4 and T5 levels with mild edema of the disc space and prevertebral phlegmon/hematoma. The findings could indicate osseous metastatic disease, multiple myeloma or discitis-osteomyelitis. 2. Minimally depressed compression fracture of T5 without retropulsion. 3. No epidural abscess. 4. No spinal canal or neural foraminal stenosis. Electronically Signed   By: Ulyses Jarred M.D.   On: 07/23/2021 19:55   NM Pulmonary Perfusion  Result Date: 07/22/2021 CLINICAL DATA:  PE suspected, low/intermediate probability positive D-dimer. EXAM: NUCLEAR MEDICINE PERFUSION LUNG SCAN TECHNIQUE: Perfusion images were obtained in multiple projections after intravenous injection of radiopharmaceutical. Ventilation scans intentionally deferred if perfusion scan and chest x-ray adequate for interpretation during COVID 19 epidemic. RADIOPHARMACEUTICALS:  4.4 mCi Tc-35mMAA IV COMPARISON:  None. FINDINGS: Normal perfusion.  No focal defects. IMPRESSION: No evidence for PE. Electronically Signed   By: CSan MorelleM.D.   On: 07/22/2021 16:26   IR Fluoro Guide Ndl Plmt / BX  Result Date: 07/28/2021 INDICATION: JALEXXANDER KURTis a 45y.o. male with PMH of DM2, retinopathy, neuropathy, microalbuminuria, HTN, HLD, CKD 3, COVID-19 infection July 2022. Patient presented to ED 07/22/2021 complaining of chest pain that radiated to both shoulder blades for approximately 6 weeks. Chest CT with contrast was significant for lytic bone disease involving T4 and T5 and possibly the sternum with a superior endplate fracture at T5 concerning for malignancy versus osteomyelitis in the setting of MSSA bacteremia. She comes to our service today for a T5 fluoroscopy guided or bone biopsy. EXAM: FLUOROSCOPY GUIDED T8 VERTEBRAL BODY CORE BONE BIOPSY MEDICATIONS: None. ANESTHESIA/SEDATION: Moderate (conscious) sedation was employed during this procedure. A total of Versed 1.5 mg and Fentanyl 100 mcg was administered intravenously. Moderate Sedation Time: 12 minutes. The patient's level of consciousness and vital signs were monitored continuously by radiology nursing throughout the procedure under my direct supervision. FLUOROSCOPY TIME:  Fluoroscopy Time: 7 minutes 18 seconds; DAP (430 mGy). COMPLICATIONS: None immediate. PROCEDURE: Informed written consent was obtained from the patient after a thorough discussion of the procedural risks, benefits and alternatives. All questions were addressed. Maximal Sterile Barrier Technique was utilized including caps, mask, sterile gowns, sterile gloves, sterile drape, hand hygiene and skin antiseptic. A timeout was performed prior to the initiation of the procedure. The patient was placed in prone position on the angiography table. The thoracic spine region was prepped and draped in a sterile fashion. Under fluoroscopy, the T5 vertebral body was delineated and the skin area was marked. The skin was infiltrated with a 1% Lidocaine approximately 3 cm lateral to the spinous process projection on the left. Using a 22-gauge spinal needle, the soft issue and the peripedicular space and periosteum were infiltrated with  Bupivacaine  0.5%. A skin incision was made at the access site. Subsequently, an 11-gauge Kyphon trocar was inserted under fluoroscopic guidance until contact with the pedicle was obtained. The trocar was inserted under light hammer tapping into the pedicle until the posterior boundaries of the vertebral body was reached. The diamond mandrill was removed and 2 core biopsies wereobtained. The trocar was later removed. IMPRESSION: Successful fluoroscopy-guided left transpedicular approach for T5 vertebral body core bone biopsy . Bone samples obtained were sent for tissue exam. Electronically Signed   By: Pedro Earls M.D.   On: 07/28/2021 12:13   DG CHEST PORT 1 VIEW  Result Date: 07/27/2021 CLINICAL DATA:  Chest pain over the last 6 weeks. EXAM: PORTABLE CHEST 1 VIEW COMPARISON:  07/22/2021 FINDINGS: Heart size is normal. The patient has developed patchy infiltrates at both lung bases consistent with bibasilar pneumonia. The upper lungs are clear. No visible effusion. No abnormal bone finding. IMPRESSION: Bilateral lower lobe pneumonia. Electronically Signed   By: Nelson Chimes M.D.   On: 07/27/2021 12:27   ECHOCARDIOGRAM COMPLETE  Result Date: 07/24/2021    ECHOCARDIOGRAM REPORT   Patient Name:   Jose [name] Date of Exam: 07/24/2021 Medical Rec #:  [id number]       Height:       71.0 in Accession #:    [id number]      Weight:       193.8 lb Date of Birth:  06-Jul-1976       BSA:          2.081 m Patient Age:    1 years        BP:           139/81 mmHg Patient Gender: M               HR:           111 bpm. Exam Location:  Inpatient Procedure: 2D Echo, Color Doppler and Cardiac Doppler Indications:    Bacteremia  History:        Patient has no prior history of Echocardiogram examinations.                 Risk Factors:Diabetes and Dyslipidemia.  Sonographer:    Raquel Sarna Senior RDCS Referring Phys: [id number] Malachy Moan THOMPSON  Sonographer Comments: Chest wall very tender to the touch, exam performed  with minimal probe pressure for patient comfort. Wall motion obtained from short axis. IMPRESSIONS  1. Left ventricular ejection fraction, by estimation, is >75%. The left ventricle has hyperdynamic function. The left ventricle has no regional wall motion abnormalities. Left ventricular diastolic parameters are indeterminate.  2. Right ventricular systolic function is normal. The right ventricular size is normal. There is normal pulmonary artery systolic pressure. The estimated right ventricular systolic pressure is 88.9 mmHg.  3. The mitral valve is grossly normal. No evidence of mitral valve regurgitation.  4. The aortic valve is tricuspid. Aortic valve regurgitation is not visualized.  5. The inferior vena cava is normal in size with <50% respiratory variability, suggesting right atrial pressure of 8 mmHg. Comparison(s): No prior Echocardiogram. Conclusion(s)/Recommendation(s): No evidence of valvular vegetations on this transthoracic echocardiogram, however the right-sided valves are poorly visualized. Would recommend a transesophageal echocardiogram to exclude infective endocarditis if clinically indicated. FINDINGS  Left Ventricle: Left ventricular ejection fraction, by estimation, is >75%. The left ventricle has hyperdynamic function. The left ventricle has no regional wall motion abnormalities. The left ventricular internal cavity size was small. There is  no left  ventricular hypertrophy. Left ventricular diastolic parameters are indeterminate. Right Ventricle: The right ventricular size is normal. No increase in right ventricular wall thickness. Right ventricular systolic function is normal. There is normal pulmonary artery systolic pressure. The tricuspid regurgitant velocity is 2.59 m/s, and  with an assumed right atrial pressure of 8 mmHg, the estimated right ventricular systolic pressure is 46.2 mmHg. Left Atrium: Left atrial size was normal in size. Right Atrium: Right atrial size was normal in size.  Pericardium: There is no evidence of pericardial effusion. Mitral Valve: The mitral valve is grossly normal. No evidence of mitral valve regurgitation. Tricuspid Valve: The tricuspid valve is grossly normal. Tricuspid valve regurgitation is trivial. Aortic Valve: The aortic valve is tricuspid. Aortic valve regurgitation is not visualized. Pulmonic Valve: The pulmonic valve was normal in structure. Pulmonic valve regurgitation is not visualized. Aorta: The aortic root and ascending aorta are structurally normal, with no evidence of dilitation. Venous: The inferior vena cava is normal in size with less than 50% respiratory variability, suggesting right atrial pressure of 8 mmHg. IAS/Shunts: No atrial level shunt detected by color flow Doppler.  LEFT VENTRICLE PLAX 2D LVIDd:         3.70 cm LVIDs:         2.15 cm LV PW:         1.15 cm LV IVS:        0.90 cm LVOT diam:     1.90 cm LV SV:         56 LV SV Index:   27 LVOT Area:     2.84 cm  RIGHT VENTRICLE RV S prime:     27.80 cm/s TAPSE (M-mode): 3.6 cm LEFT ATRIUM           Index LA diam:      3.25 cm 1.56 cm/m LA Vol (A4C): 28.3 ml 13.60 ml/m  AORTIC VALVE LVOT Vmax:   123.00 cm/s LVOT Vmean:  95.500 cm/s LVOT VTI:    0.197 m  AORTA Ao Root diam: 3.70 cm TRICUSPID VALVE TR Peak grad:   26.8 mmHg TR Vmax:        259.00 cm/s  SHUNTS Systemic VTI:  0.20 m Systemic Diam: 1.90 cm Lyman Bishop MD Electronically signed by Lyman Bishop MD Signature Date/Time: 07/24/2021/1:53:34 PM    Final    ECHO TEE  Result Date: 07/29/2021    TRANSESOPHOGEAL ECHO REPORT   Patient Name:   Jose [name] Date of Exam: 07/29/2021 Medical Rec #:  [id number]       Height:       71.0 in Accession #:    [id number]      Weight:       190.0 lb Date of Birth:  1976-07-14       BSA:          2.063 m Patient Age:    26 years        BP:           148/71 mmHg Patient Gender: M               HR:           103 bpm. Exam Location:  Inpatient Procedure: Transesophageal Echo, Color Doppler and  Saline Contrast Bubble Study Indications:    Bacteremia  History:        Patient has prior history of Echocardiogram examinations, most  recent 07/24/2021.  Sonographer:    Philipp Deputy RDCS Referring Phys: [id number] Margie Billet PROCEDURE: After discussion of the risks and benefits of a TEE, an informed consent was obtained from the patient. The transesophogeal probe was passed without difficulty through the esophogus of the patient. Imaged were obtained with the patient in a left lateral decubitus position. Sedation performed by different physician. The patient was monitored while under deep sedation. Anesthestetic sedation was provided intravenously by Anesthesiology: 252m of Propofol. Image quality was adequate. The patient's vital signs; including heart rate, blood pressure, and oxygen saturation; remained stable throughout the procedure. The patient developed no complications during the procedure. IMPRESSIONS  1. Left ventricular ejection fraction, by estimation, is 60 to 65%. The left ventricle has normal function. The left ventricle has no regional wall motion abnormalities.  2. Right ventricular systolic function is normal. The right ventricular size is normal.  3. No left atrial/left atrial appendage thrombus was detected.  4. The mitral valve is normal in structure. Trivial mitral valve regurgitation. No evidence of mitral stenosis.  5. The aortic valve is normal in structure. Aortic valve regurgitation is not visualized. No aortic stenosis is present.  6. The inferior vena cava is normal in size with greater than 50% respiratory variability, suggesting right atrial pressure of 3 mmHg.  7. Evidence of atrial level shunting detected by color flow Doppler. Agitated saline contrast bubble study was positive with shunting observed within 3-6 cardiac cycles suggestive of interatrial shunt. There is a small patent foramen ovale with bidirectional shunting across atrial septum.  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 FINDINGS  Left Ventricle: Left ventricular ejection fraction, by estimation, is 60 to 65%. The left ventricle has normal function. The left ventricle has no regional wall motion abnormalities. The left ventricular internal cavity size was normal in size. There is  no left ventricular hypertrophy. Right Ventricle: The right ventricular size is normal. No increase in right ventricular wall thickness. Right ventricular systolic function is normal. Left Atrium: Left atrial size was normal in size. No left atrial/left atrial appendage thrombus was detected. Right Atrium: Right atrial size was normal in size. Pericardium: There is no evidence of pericardial effusion. Mitral Valve: The mitral valve is normal in structure. Trivial mitral valve regurgitation. No evidence of mitral valve stenosis. Tricuspid Valve: The tricuspid valve is normal in structure. Tricuspid valve regurgitation is not demonstrated. No evidence of tricuspid stenosis. Aortic Valve: The aortic valve is normal in structure. Aortic valve regurgitation is not visualized. No aortic stenosis is present. Pulmonic Valve: The pulmonic valve was normal in structure. Pulmonic valve regurgitation is not visualized. No evidence of pulmonic stenosis. Aorta: The aortic root is normal in size and structure. Venous: The inferior vena cava is normal in size with greater than 50% respiratory variability, suggesting right atrial pressure of 3 mmHg. IAS/Shunts: Evidence of atrial level shunting detected by color flow Doppler. Agitated saline contrast was given intravenously to evaluate for intracardiac shunting. Agitated saline contrast bubble study was positive with shunting observed within 3-6 cardiac cycles suggestive of interatrial shunt. A small patent foramen ovale is detected with bidirectional shunting across atrial septum. MCandee FurbishMD  Electronically signed by MCandee FurbishMD Signature Date/Time: 07/29/2021/2:53:55 PM    Final    [place]KG SITE RITE  Result Date: 07/30/2021 If Site Rite image not attached, placement could not be confirmed due to current cardiac rhythm.   Microbiology: Recent Results (from the past 240 hour(s))  Culture,  blood (routine x 2)     Status: None   Collection Time: 07/24/21  5:04 PM   Specimen: BLOOD  Result Value Ref Range Status   Specimen Description   Final    BLOOD RIGHT ANTECUBITAL Performed at [facility] [address], [place], [place] [address]    Special Requests   Final    BOTTLES DRAWN AEROBIC AND ANAEROBIC Blood Culture adequate volume Performed at [facility] [address] [place], [place] [address]    Culture   Final    NO GROWTH 5 DAYS Performed at [facility], [place] [address], [place], [place] [address]    Report Status 07/29/2021 FINAL  Final  Culture, blood (routine x 2)     Status: None   Collection Time: 07/24/21  5:04 PM   Specimen: BLOOD  Result Value Ref Range Status   Specimen Description   Final    BLOOD LEFT ANTECUBITAL Performed at [facility] [address] [place], [place] [address]    Special Requests   Final    BOTTLES DRAWN AEROBIC AND ANAEROBIC Blood Culture adequate volume Performed at [facility] [address] [place], [place] [address]    Culture   Final    NO GROWTH 5 DAYS Performed at [facility], [place] [address], [place], [place] [address]    Report Status 07/29/2021 FINAL  Final     Labs: Basic Metabolic Panel: Recent Labs  Lab 07/29/21 0517 07/31/21 0531 08/01/21 1049 08/02/21 1421 08/03/21 0541  NA 138 135 128* 135 134*  K 4.2 4.3 4.3 4.9 4.8  CL 103 102 95* 101 103  CO2 23 24 23 25 25   GLUCOSE 138* 253* 218* 241* 212*  BUN 28* 32* 35* 39* 40*  CREATININE 1.92* 1.73* 2.08* 2.32* 2.20*  CALCIUM 9.3 9.2 9.0 9.2 9.2  PHOS 5.4*   --   --   --   --    Liver Function Tests: Recent Labs  Lab 07/29/21 0517  ALBUMIN 2.3*   No results for input(s): LIPASE, AMYLASE in the last 168 hours. No results for input(s): AMMONIA in the last 168 hours. CBC: Recent Labs  Lab 07/28/21 0520 07/31/21 0531 08/01/21 1049 08/02/21 1421  WBC 12.5* 13.3* 19.5* 16.9*  NEUTROABS 9.3* 9.3*  --   --   HGB 8.4* 8.1* 8.6* 7.8*  HCT 25.2* 24.7* 26.3* 24.1*  MCV 84.0 84.6 85.1 86.4  PLT 358 352 395 385   Cardiac Enzymes: No results for input(s): CKTOTAL, CKMB, CKMBINDEX, TROPONINI in the last 168 hours. BNP: BNP (last 3 results) No results for input(s): BNP in the last 8760 hours.  ProBNP (last 3 results) No results for input(s): PROBNP in the last 8760 hours.  CBG: Recent Labs  Lab 08/01/21 1452 08/01/21 2018 08/02/21 2312 08/03/21 0634 08/03/21 0725  GLUCAP 235* 282* 248* 197* 196*       Signed:  Cristal Deer, MD [facility] 08/03/2021, 9:43 AM

## 2021-08-06 ENCOUNTER — Emergency Department (HOSPITAL_COMMUNITY): Payer: 59

## 2021-08-06 ENCOUNTER — Inpatient Hospital Stay (HOSPITAL_COMMUNITY)
Admission: EM | Admit: 2021-08-06 | Discharge: 2021-08-30 | DRG: 853 | Disposition: A | Discharge status: Rehabilitation Facility | Payer: 59 | Attending: Internal Medicine | Admitting: Internal Medicine

## 2021-08-06 ENCOUNTER — Encounter (HOSPITAL_COMMUNITY): Payer: Self-pay

## 2021-08-06 ENCOUNTER — Other Ambulatory Visit: Payer: Self-pay

## 2021-08-06 DIAGNOSIS — E1142 Type 2 diabetes mellitus with diabetic polyneuropathy: Secondary | ICD-10-CM | POA: Diagnosis present

## 2021-08-06 DIAGNOSIS — Z7189 Other specified counseling: Secondary | ICD-10-CM | POA: Diagnosis not present

## 2021-08-06 DIAGNOSIS — K5900 Constipation, unspecified: Secondary | ICD-10-CM | POA: Diagnosis present

## 2021-08-06 DIAGNOSIS — D509 Iron deficiency anemia, unspecified: Secondary | ICD-10-CM | POA: Diagnosis present

## 2021-08-06 DIAGNOSIS — E1122 Type 2 diabetes mellitus with diabetic chronic kidney disease: Secondary | ICD-10-CM | POA: Diagnosis present

## 2021-08-06 DIAGNOSIS — L97529 Non-pressure chronic ulcer of other part of left foot with unspecified severity: Secondary | ICD-10-CM | POA: Diagnosis present

## 2021-08-06 DIAGNOSIS — I1310 Hypertensive heart and chronic kidney disease without heart failure, with stage 1 through stage 4 chronic kidney disease, or unspecified chronic kidney disease: Secondary | ICD-10-CM | POA: Diagnosis present

## 2021-08-06 DIAGNOSIS — M869 Osteomyelitis, unspecified: Secondary | ICD-10-CM

## 2021-08-06 DIAGNOSIS — J9811 Atelectasis: Secondary | ICD-10-CM | POA: Diagnosis not present

## 2021-08-06 DIAGNOSIS — K2211 Ulcer of esophagus with bleeding: Secondary | ICD-10-CM | POA: Diagnosis not present

## 2021-08-06 DIAGNOSIS — K921 Melena: Secondary | ICD-10-CM | POA: Diagnosis not present

## 2021-08-06 DIAGNOSIS — D631 Anemia in chronic kidney disease: Secondary | ICD-10-CM | POA: Diagnosis present

## 2021-08-06 DIAGNOSIS — Z794 Long term (current) use of insulin: Secondary | ICD-10-CM | POA: Diagnosis present

## 2021-08-06 DIAGNOSIS — A4101 Sepsis due to Methicillin susceptible Staphylococcus aureus: Secondary | ICD-10-CM | POA: Diagnosis present

## 2021-08-06 DIAGNOSIS — G061 Intraspinal abscess and granuloma: Secondary | ICD-10-CM | POA: Diagnosis present

## 2021-08-06 DIAGNOSIS — E669 Obesity, unspecified: Secondary | ICD-10-CM | POA: Diagnosis present

## 2021-08-06 DIAGNOSIS — Z01818 Encounter for other preprocedural examination: Secondary | ICD-10-CM

## 2021-08-06 DIAGNOSIS — B3781 Candidal esophagitis: Secondary | ICD-10-CM | POA: Diagnosis present

## 2021-08-06 DIAGNOSIS — E11621 Type 2 diabetes mellitus with foot ulcer: Secondary | ICD-10-CM | POA: Diagnosis present

## 2021-08-06 DIAGNOSIS — R0600 Dyspnea, unspecified: Secondary | ICD-10-CM

## 2021-08-06 DIAGNOSIS — N1832 Chronic kidney disease, stage 3b: Secondary | ICD-10-CM | POA: Diagnosis present

## 2021-08-06 DIAGNOSIS — K7460 Unspecified cirrhosis of liver: Secondary | ICD-10-CM | POA: Diagnosis present

## 2021-08-06 DIAGNOSIS — J90 Pleural effusion, not elsewhere classified: Secondary | ICD-10-CM | POA: Diagnosis present

## 2021-08-06 DIAGNOSIS — R0902 Hypoxemia: Secondary | ICD-10-CM

## 2021-08-06 DIAGNOSIS — L89152 Pressure ulcer of sacral region, stage 2: Secondary | ICD-10-CM | POA: Diagnosis present

## 2021-08-06 DIAGNOSIS — J9621 Acute and chronic respiratory failure with hypoxia: Secondary | ICD-10-CM | POA: Diagnosis not present

## 2021-08-06 DIAGNOSIS — R7309 Other abnormal glucose: Secondary | ICD-10-CM | POA: Diagnosis not present

## 2021-08-06 DIAGNOSIS — E875 Hyperkalemia: Secondary | ICD-10-CM | POA: Diagnosis not present

## 2021-08-06 DIAGNOSIS — I2510 Atherosclerotic heart disease of native coronary artery without angina pectoris: Secondary | ICD-10-CM | POA: Diagnosis present

## 2021-08-06 DIAGNOSIS — E785 Hyperlipidemia, unspecified: Secondary | ICD-10-CM | POA: Diagnosis present

## 2021-08-06 DIAGNOSIS — Z823 Family history of stroke: Secondary | ICD-10-CM

## 2021-08-06 DIAGNOSIS — R609 Edema, unspecified: Secondary | ICD-10-CM | POA: Diagnosis not present

## 2021-08-06 DIAGNOSIS — K7200 Acute and subacute hepatic failure without coma: Secondary | ICD-10-CM | POA: Diagnosis not present

## 2021-08-06 DIAGNOSIS — N529 Male erectile dysfunction, unspecified: Secondary | ICD-10-CM | POA: Diagnosis present

## 2021-08-06 DIAGNOSIS — E871 Hypo-osmolality and hyponatremia: Secondary | ICD-10-CM | POA: Diagnosis not present

## 2021-08-06 DIAGNOSIS — Z83438 Family history of other disorder of lipoprotein metabolism and other lipidemia: Secondary | ICD-10-CM

## 2021-08-06 DIAGNOSIS — N179 Acute kidney failure, unspecified: Secondary | ICD-10-CM | POA: Diagnosis not present

## 2021-08-06 DIAGNOSIS — R0602 Shortness of breath: Secondary | ICD-10-CM

## 2021-08-06 DIAGNOSIS — Z9109 Other allergy status, other than to drugs and biological substances: Secondary | ICD-10-CM

## 2021-08-06 DIAGNOSIS — M65321 Trigger finger, right index finger: Secondary | ICD-10-CM | POA: Diagnosis present

## 2021-08-06 DIAGNOSIS — M4624 Osteomyelitis of vertebra, thoracic region: Secondary | ICD-10-CM | POA: Diagnosis present

## 2021-08-06 DIAGNOSIS — L89326 Pressure-induced deep tissue damage of left buttock: Secondary | ICD-10-CM | POA: Diagnosis present

## 2021-08-06 DIAGNOSIS — Z8616 Personal history of COVID-19: Secondary | ICD-10-CM | POA: Diagnosis not present

## 2021-08-06 DIAGNOSIS — R6520 Severe sepsis without septic shock: Secondary | ICD-10-CM | POA: Diagnosis not present

## 2021-08-06 DIAGNOSIS — E1169 Type 2 diabetes mellitus with other specified complication: Secondary | ICD-10-CM | POA: Diagnosis present

## 2021-08-06 DIAGNOSIS — R5381 Other malaise: Secondary | ICD-10-CM | POA: Diagnosis not present

## 2021-08-06 DIAGNOSIS — Z9289 Personal history of other medical treatment: Secondary | ICD-10-CM

## 2021-08-06 DIAGNOSIS — R339 Retention of urine, unspecified: Secondary | ICD-10-CM | POA: Diagnosis present

## 2021-08-06 DIAGNOSIS — Z7984 Long term (current) use of oral hypoglycemic drugs: Secondary | ICD-10-CM

## 2021-08-06 DIAGNOSIS — R066 Hiccough: Secondary | ICD-10-CM | POA: Diagnosis not present

## 2021-08-06 DIAGNOSIS — D62 Acute posthemorrhagic anemia: Secondary | ICD-10-CM | POA: Diagnosis not present

## 2021-08-06 DIAGNOSIS — A419 Sepsis, unspecified organism: Secondary | ICD-10-CM | POA: Diagnosis not present

## 2021-08-06 DIAGNOSIS — Z4659 Encounter for fitting and adjustment of other gastrointestinal appliance and device: Secondary | ICD-10-CM

## 2021-08-06 DIAGNOSIS — N3289 Other specified disorders of bladder: Secondary | ICD-10-CM | POA: Diagnosis present

## 2021-08-06 DIAGNOSIS — Z79899 Other long term (current) drug therapy: Secondary | ICD-10-CM

## 2021-08-06 DIAGNOSIS — Z9911 Dependence on respirator [ventilator] status: Secondary | ICD-10-CM

## 2021-08-06 DIAGNOSIS — G9520 Unspecified cord compression: Secondary | ICD-10-CM | POA: Diagnosis not present

## 2021-08-06 DIAGNOSIS — K920 Hematemesis: Secondary | ICD-10-CM | POA: Diagnosis not present

## 2021-08-06 DIAGNOSIS — M8628 Subacute osteomyelitis, other site: Secondary | ICD-10-CM | POA: Diagnosis not present

## 2021-08-06 DIAGNOSIS — Z419 Encounter for procedure for purposes other than remedying health state, unspecified: Secondary | ICD-10-CM

## 2021-08-06 DIAGNOSIS — B9561 Methicillin susceptible Staphylococcus aureus infection as the cause of diseases classified elsewhere: Secondary | ICD-10-CM

## 2021-08-06 DIAGNOSIS — G9349 Other encephalopathy: Secondary | ICD-10-CM | POA: Diagnosis not present

## 2021-08-06 DIAGNOSIS — I10 Essential (primary) hypertension: Secondary | ICD-10-CM | POA: Diagnosis present

## 2021-08-06 DIAGNOSIS — J189 Pneumonia, unspecified organism: Secondary | ICD-10-CM

## 2021-08-06 DIAGNOSIS — N170 Acute kidney failure with tubular necrosis: Secondary | ICD-10-CM | POA: Diagnosis not present

## 2021-08-06 DIAGNOSIS — M4804 Spinal stenosis, thoracic region: Secondary | ICD-10-CM | POA: Diagnosis present

## 2021-08-06 DIAGNOSIS — E870 Hyperosmolality and hypernatremia: Secondary | ICD-10-CM | POA: Diagnosis not present

## 2021-08-06 DIAGNOSIS — K264 Chronic or unspecified duodenal ulcer with hemorrhage: Secondary | ICD-10-CM | POA: Diagnosis not present

## 2021-08-06 DIAGNOSIS — J690 Pneumonitis due to inhalation of food and vomit: Secondary | ICD-10-CM | POA: Diagnosis not present

## 2021-08-06 DIAGNOSIS — M4644 Discitis, unspecified, thoracic region: Secondary | ICD-10-CM | POA: Diagnosis present

## 2021-08-06 DIAGNOSIS — Z515 Encounter for palliative care: Secondary | ICD-10-CM | POA: Diagnosis not present

## 2021-08-06 DIAGNOSIS — Z87891 Personal history of nicotine dependence: Secondary | ICD-10-CM

## 2021-08-06 DIAGNOSIS — Z833 Family history of diabetes mellitus: Secondary | ICD-10-CM

## 2021-08-06 DIAGNOSIS — L0591 Pilonidal cyst without abscess: Secondary | ICD-10-CM | POA: Diagnosis present

## 2021-08-06 DIAGNOSIS — J9691 Respiratory failure, unspecified with hypoxia: Secondary | ICD-10-CM | POA: Diagnosis not present

## 2021-08-06 DIAGNOSIS — J9601 Acute respiratory failure with hypoxia: Secondary | ICD-10-CM | POA: Diagnosis not present

## 2021-08-06 DIAGNOSIS — E8770 Fluid overload, unspecified: Secondary | ICD-10-CM | POA: Diagnosis present

## 2021-08-06 DIAGNOSIS — E11649 Type 2 diabetes mellitus with hypoglycemia without coma: Secondary | ICD-10-CM | POA: Diagnosis not present

## 2021-08-06 DIAGNOSIS — F411 Generalized anxiety disorder: Secondary | ICD-10-CM | POA: Diagnosis present

## 2021-08-06 DIAGNOSIS — Z978 Presence of other specified devices: Secondary | ICD-10-CM

## 2021-08-06 DIAGNOSIS — F05 Delirium due to known physiological condition: Secondary | ICD-10-CM | POA: Diagnosis not present

## 2021-08-06 DIAGNOSIS — E11319 Type 2 diabetes mellitus with unspecified diabetic retinopathy without macular edema: Secondary | ICD-10-CM | POA: Diagnosis present

## 2021-08-06 DIAGNOSIS — M8448XA Pathological fracture, other site, initial encounter for fracture: Secondary | ICD-10-CM | POA: Diagnosis present

## 2021-08-06 DIAGNOSIS — Z818 Family history of other mental and behavioral disorders: Secondary | ICD-10-CM

## 2021-08-06 DIAGNOSIS — N319 Neuromuscular dysfunction of bladder, unspecified: Secondary | ICD-10-CM | POA: Diagnosis not present

## 2021-08-06 DIAGNOSIS — M4714 Other spondylosis with myelopathy, thoracic region: Secondary | ICD-10-CM | POA: Diagnosis not present

## 2021-08-06 DIAGNOSIS — M7989 Other specified soft tissue disorders: Secondary | ICD-10-CM | POA: Diagnosis not present

## 2021-08-06 DIAGNOSIS — R140 Abdominal distension (gaseous): Secondary | ICD-10-CM | POA: Diagnosis not present

## 2021-08-06 DIAGNOSIS — R7881 Bacteremia: Secondary | ICD-10-CM

## 2021-08-06 DIAGNOSIS — E1165 Type 2 diabetes mellitus with hyperglycemia: Secondary | ICD-10-CM | POA: Diagnosis present

## 2021-08-06 DIAGNOSIS — R1084 Generalized abdominal pain: Secondary | ICD-10-CM

## 2021-08-06 DIAGNOSIS — N183 Chronic kidney disease, stage 3 unspecified: Secondary | ICD-10-CM | POA: Diagnosis present

## 2021-08-06 DIAGNOSIS — K668 Other specified disorders of peritoneum: Secondary | ICD-10-CM

## 2021-08-06 DIAGNOSIS — R000 Tachycardia, unspecified: Secondary | ICD-10-CM | POA: Diagnosis present

## 2021-08-06 DIAGNOSIS — H3530 Unspecified macular degeneration: Secondary | ICD-10-CM | POA: Diagnosis present

## 2021-08-06 DIAGNOSIS — J9690 Respiratory failure, unspecified, unspecified whether with hypoxia or hypercapnia: Secondary | ICD-10-CM

## 2021-08-06 DIAGNOSIS — L89316 Pressure-induced deep tissue damage of right buttock: Secondary | ICD-10-CM | POA: Diagnosis present

## 2021-08-06 LAB — COMPREHENSIVE METABOLIC PANEL
ALT: <5 U/L (ref 0–44)
AST: 17 U/L (ref 15–41)
Albumin: 2 g/dL — ABNORMAL LOW (ref 3.5–5.0)
Alkaline Phosphatase: 163 U/L — ABNORMAL HIGH (ref 38–126)
Anion gap: 13 (ref 5–15)
BUN: 21 mg/dL — ABNORMAL HIGH (ref 6–20)
CO2: 20 mmol/L — ABNORMAL LOW (ref 22–32)
Calcium: 9.4 mg/dL (ref 8.9–10.3)
Chloride: 101 mmol/L (ref 98–111)
Creatinine, Ser: 1.5 mg/dL — ABNORMAL HIGH (ref 0.61–1.24)
GFR, Estimated: 57 mL/min — ABNORMAL LOW (ref >60)
Glucose, Bld: 167 mg/dL — ABNORMAL HIGH (ref 70–99)
Potassium: 4.6 mmol/L (ref 3.5–5.1)
Sodium: 134 mmol/L — ABNORMAL LOW (ref 135–145)
Total Bilirubin: 0.5 mg/dL (ref 0.3–1.2)
Total Protein: 7 g/dL (ref 6.5–8.1)

## 2021-08-06 LAB — LIPASE, BLOOD: Lipase: 21 U/L (ref 11–51)

## 2021-08-06 LAB — CBC WITH DIFFERENTIAL/PLATELET
Abs Immature Granulocytes: 0.07 10*3/uL (ref 0.00–0.07)
Basophils Absolute: 0.1 10*3/uL (ref 0.0–0.1)
Basophils Relative: 1 %
Eosinophils Absolute: 0.4 10*3/uL (ref 0.0–0.5)
Eosinophils Relative: 3 %
HCT: 26.1 % — ABNORMAL LOW (ref 39.0–52.0)
Hemoglobin: 8.4 g/dL — ABNORMAL LOW (ref 13.0–17.0)
Immature Granulocytes: 1 %
Lymphocytes Relative: 9 %
Lymphs Abs: 1.2 10*3/uL (ref 0.7–4.0)
MCH: 27.6 pg (ref 26.0–34.0)
MCHC: 32.2 g/dL (ref 30.0–36.0)
MCV: 85.9 fL (ref 80.0–100.0)
Monocytes Absolute: 1.4 10*3/uL — ABNORMAL HIGH (ref 0.1–1.0)
Monocytes Relative: 11 %
Neutro Abs: 10 10*3/uL — ABNORMAL HIGH (ref 1.7–7.7)
Neutrophils Relative %: 75 %
Platelets: 518 10*3/uL — ABNORMAL HIGH (ref 150–400)
RBC: 3.04 MIL/uL — ABNORMAL LOW (ref 4.22–5.81)
RDW: 13.2 % (ref 11.5–15.5)
WBC: 13.1 10*3/uL — ABNORMAL HIGH (ref 4.0–10.5)
nRBC: 0 % (ref 0.0–0.2)

## 2021-08-06 LAB — GLUCOSE, CAPILLARY
Glucose-Capillary: 145 mg/dL — ABNORMAL HIGH (ref 70–99)
Glucose-Capillary: 160 mg/dL — ABNORMAL HIGH (ref 70–99)

## 2021-08-06 LAB — TROPONIN I (HIGH SENSITIVITY)
Troponin I (High Sensitivity): 6 ng/L (ref <18)
Troponin I (High Sensitivity): 7 ng/L (ref <18)

## 2021-08-06 LAB — URINALYSIS, ROUTINE W REFLEX MICROSCOPIC
Bacteria, UA: NONE SEEN
Bilirubin Urine: NEGATIVE
Glucose, UA: 50 mg/dL — AB
Ketones, ur: 5 mg/dL — AB
Leukocytes,Ua: NEGATIVE
Nitrite: NEGATIVE
Protein, ur: 100 mg/dL — AB
Specific Gravity, Urine: 1.016 (ref 1.005–1.030)
pH: 5 (ref 5.0–8.0)

## 2021-08-06 LAB — CBG MONITORING, ED: Glucose-Capillary: 138 mg/dL — ABNORMAL HIGH (ref 70–99)

## 2021-08-06 LAB — RESP PANEL BY RT-PCR (FLU A&B, COVID) ARPGX2
Influenza A by PCR: NEGATIVE
Influenza B by PCR: NEGATIVE
SARS Coronavirus 2 by RT PCR: POSITIVE — AB

## 2021-08-06 LAB — LACTIC ACID, PLASMA
Lactic Acid, Venous: 0.6 mmol/L (ref 0.5–1.9)
Lactic Acid, Venous: 0.5 mmol/L (ref 0.5–1.9)

## 2021-08-06 LAB — SURGICAL PCR SCREEN
MRSA, PCR: NEGATIVE
Staphylococcus aureus: NEGATIVE

## 2021-08-06 IMAGING — CT CT ABD-PELV W/ CM
2 of 4 series · 16 of 46 positions shown, 18 images · IV contrast (APPLIED)
Comparison: [DATE]

CLINICAL DATA: Abdominal distension, chills ongoing antibiotic
therapy for osteomyelitis

EXAM:
CT ABDOMEN AND PELVIS WITH CONTRAST
TECHNIQUE: Multidetector CT imaging of the abdomen and pelvis was performed
using the standard protocol following bolus administration of
intravenous contrast.
CONTRAST:  80mL OMNIPAQUE IOHEXOL 350 MG/ML SOLN, additional oral
enteric contrast

[Series 4: abdomen 5.0 · axial · 0.74mm/px · z∈[-404,+56]mm · 13 of 104 slices shown, 15 images]
[im 6/104  soft-tissue]
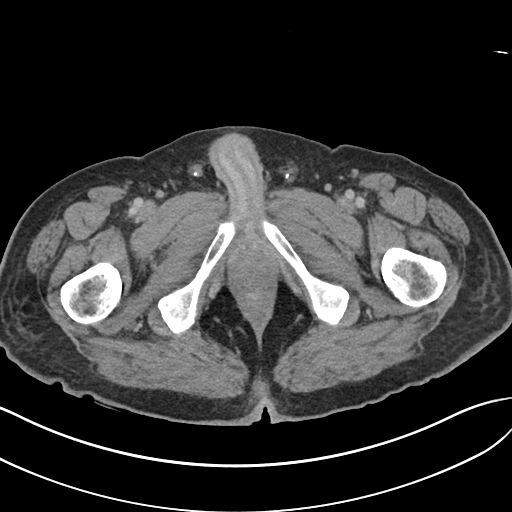
[im 6/104  bone]
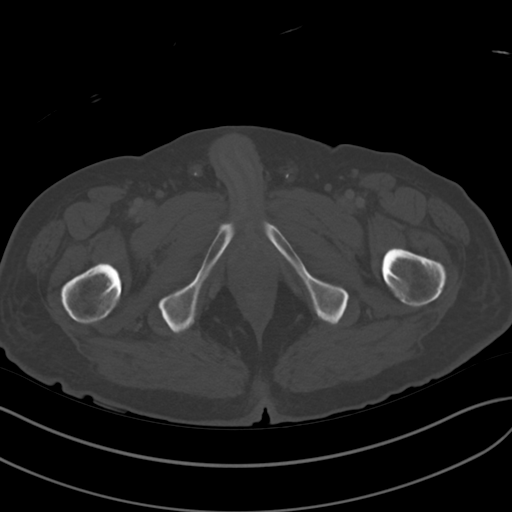
[im 16/104  soft-tissue]
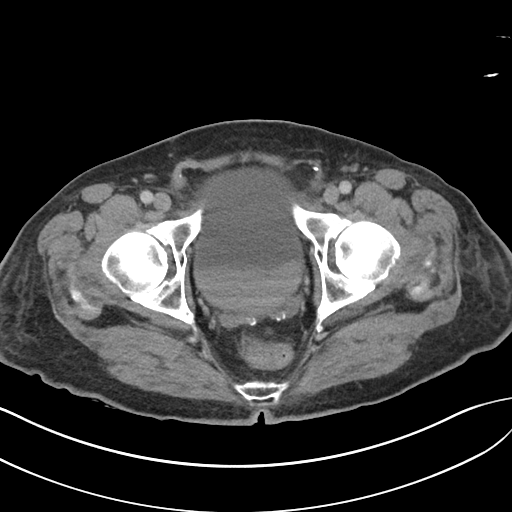
[im 21/104  soft-tissue]
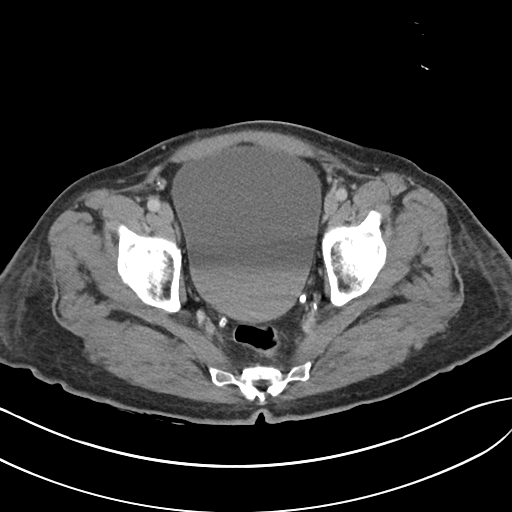
[im 31/104  soft-tissue]
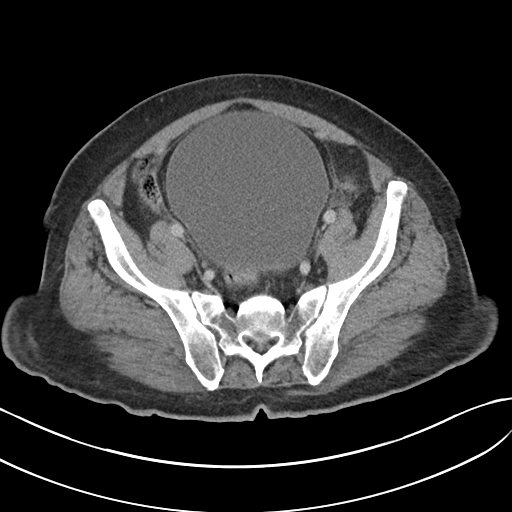
[im 37/104  soft-tissue]
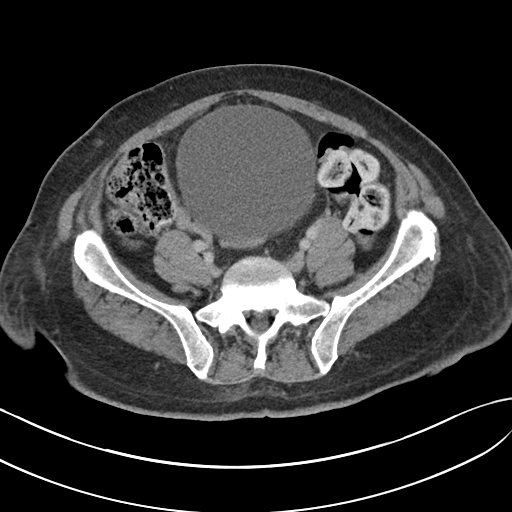
[im 47/104  soft-tissue]
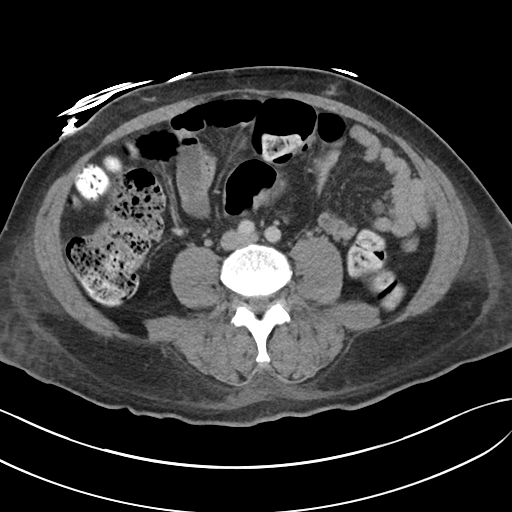
[im 52/104  soft-tissue]
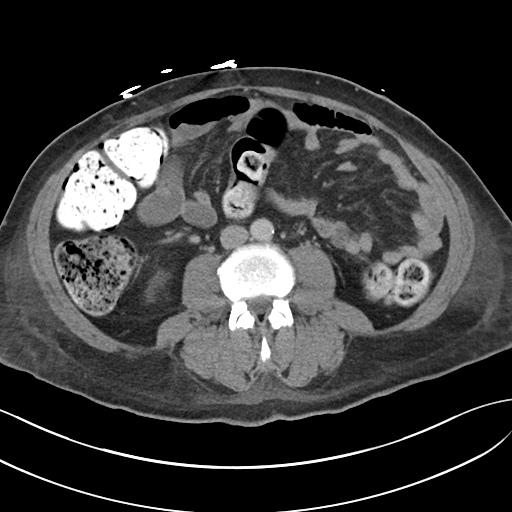
[im 57/104  soft-tissue]
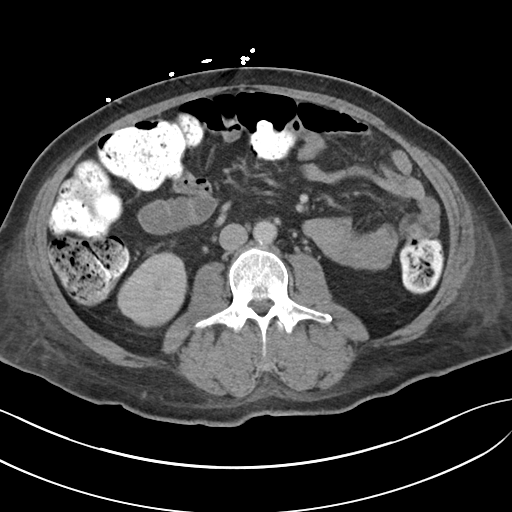
[im 67/104  soft-tissue]
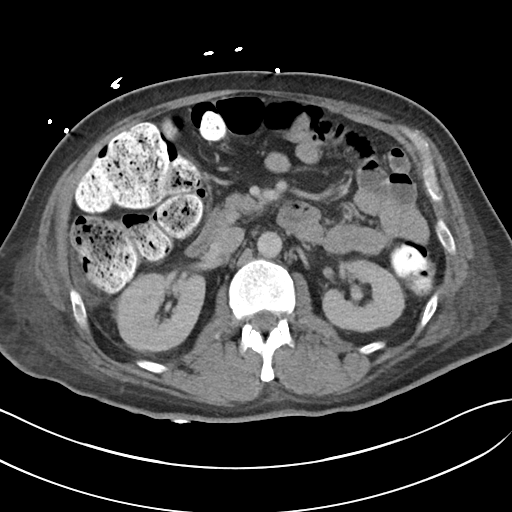
[im 67/104  bone]
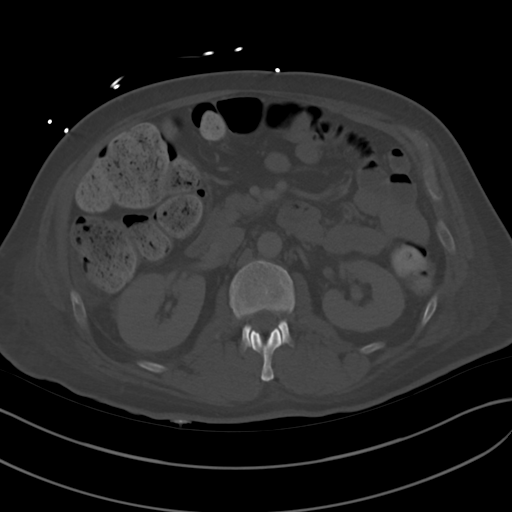
[im 73/104  soft-tissue]
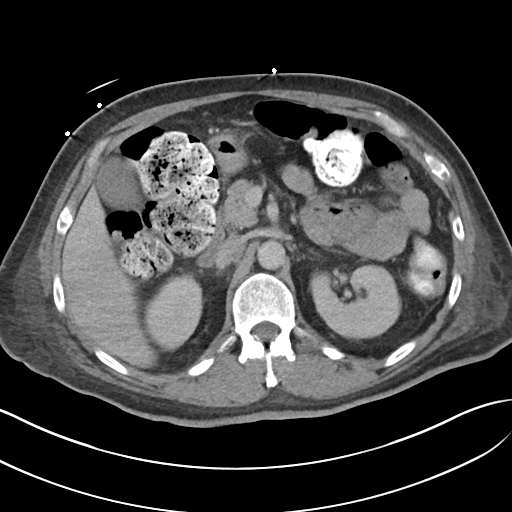
[im 83/104  soft-tissue]
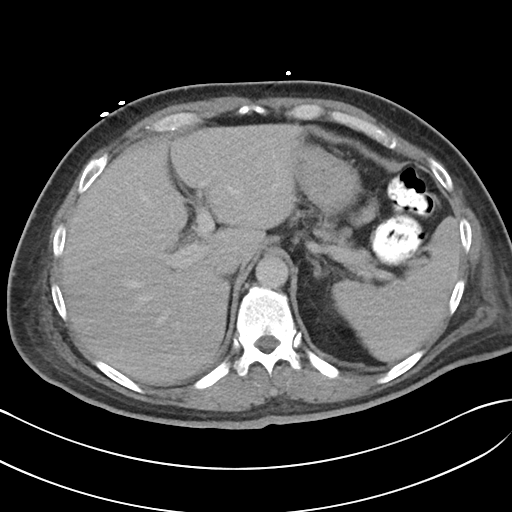
[im 88/104  soft-tissue]
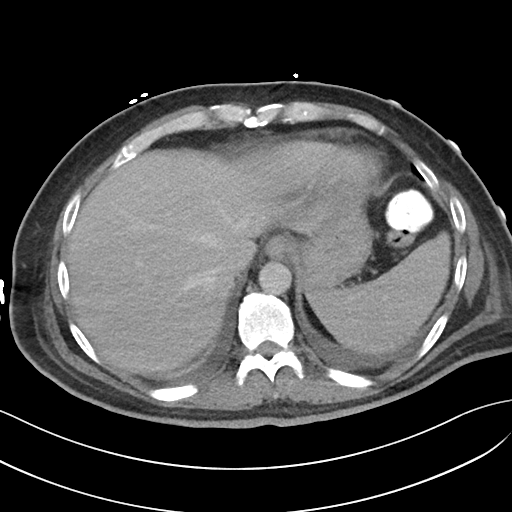
[im 98/104  soft-tissue]
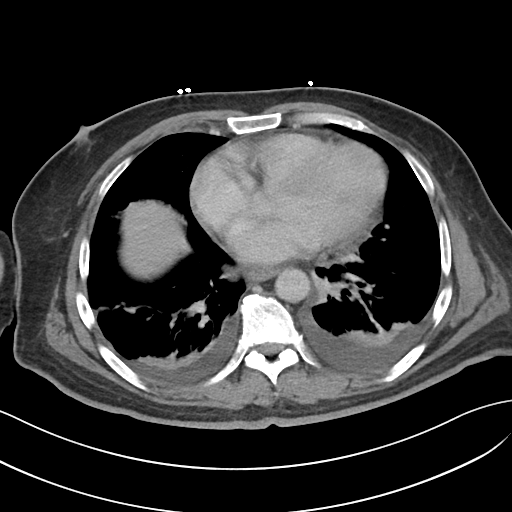

[Series 7: abdomen 3.0 mpr cor · coronal · 0.78mm/px · 3 of 93 slices shown]
[im 31/93  soft-tissue]
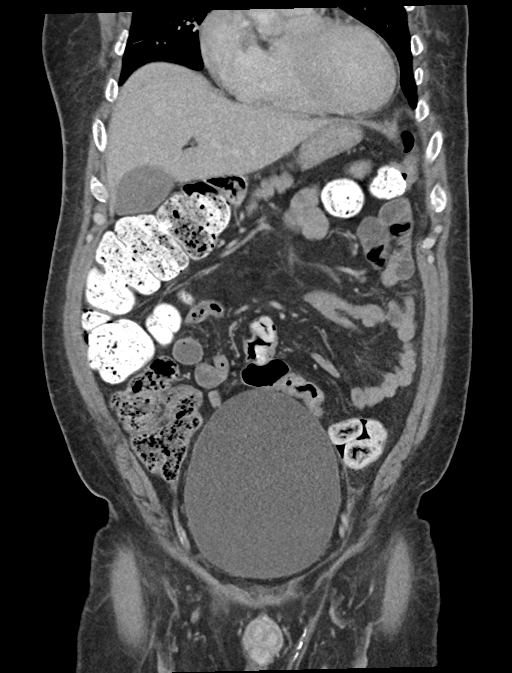
[im 41/93  soft-tissue]
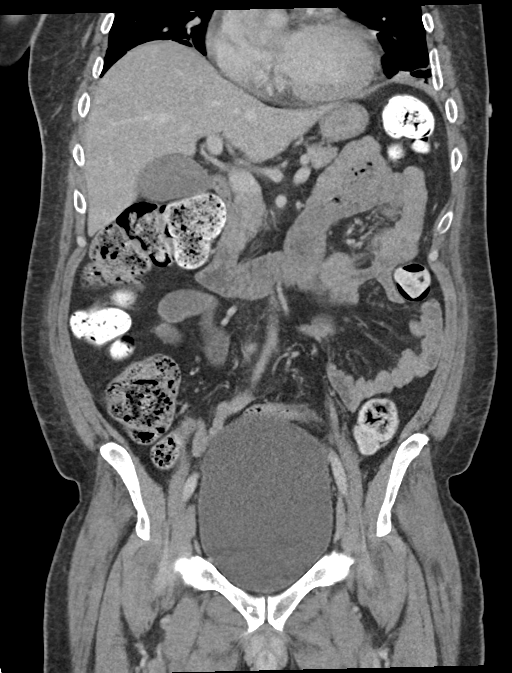
[im 52/93  soft-tissue]
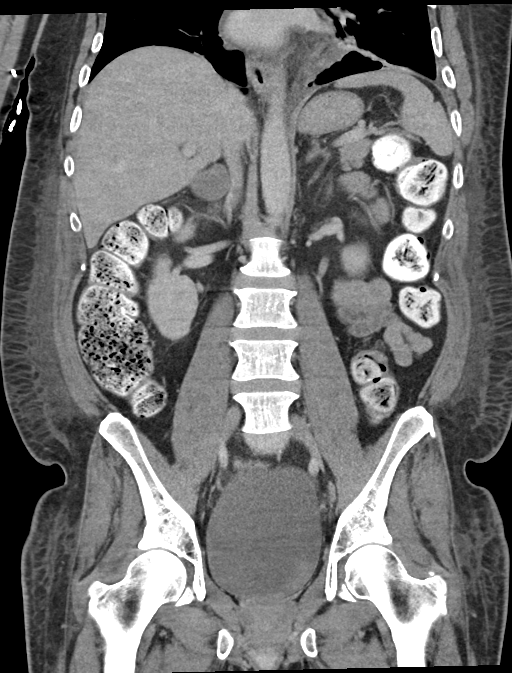

[16 of 46 positions shown; findings below may reference images not displayed]

FINDINGS: Lower chest: Please see separately reported examination of the
chest.

Hepatobiliary: No solid liver abnormality is seen. No gallstones,
gallbladder wall thickening, or biliary dilatation.

Pancreas: Unremarkable. No pancreatic ductal dilatation or
surrounding inflammatory changes.

Spleen: Normal in size without significant abnormality.

Adrenals/Urinary Tract: Adrenal glands are unremarkable. Kidneys are
normal, without renal calculi, solid lesion, or hydronephrosis.
Distended urinary bladder, measuring up to 19.0 cm.

Stomach/Bowel: Stomach is within normal limits. Appendix is not
clearly visualized. No evidence of bowel wall thickening,
distention, or inflammatory changes. Generally large burden of stool
and stool balls throughout the colon.

Vascular/Lymphatic: Aortic atherosclerosis. No enlarged abdominal or
pelvic lymph nodes.

Reproductive: No mass or other significant abnormality.

Other: Anasarca.  No abdominopelvic ascites.

Musculoskeletal: No acute or significant osseous findings.
IMPRESSION: 1. Generally large burden of stool and stool balls throughout the
colon.
2. Distended urinary bladder, measuring up to 19.0 cm. Correlate for
urinary retention.
3. Anasarca.

Aortic Atherosclerosis ([TK]-[TK]).

## 2021-08-06 IMAGING — DX DG CHEST 1V PORT
1 series · 1 of 1 positions shown · non-contrast
Comparison: Ten days ago

CLINICAL DATA: Chest pain

EXAM:
PORTABLE CHEST 1 VIEW

[chest]
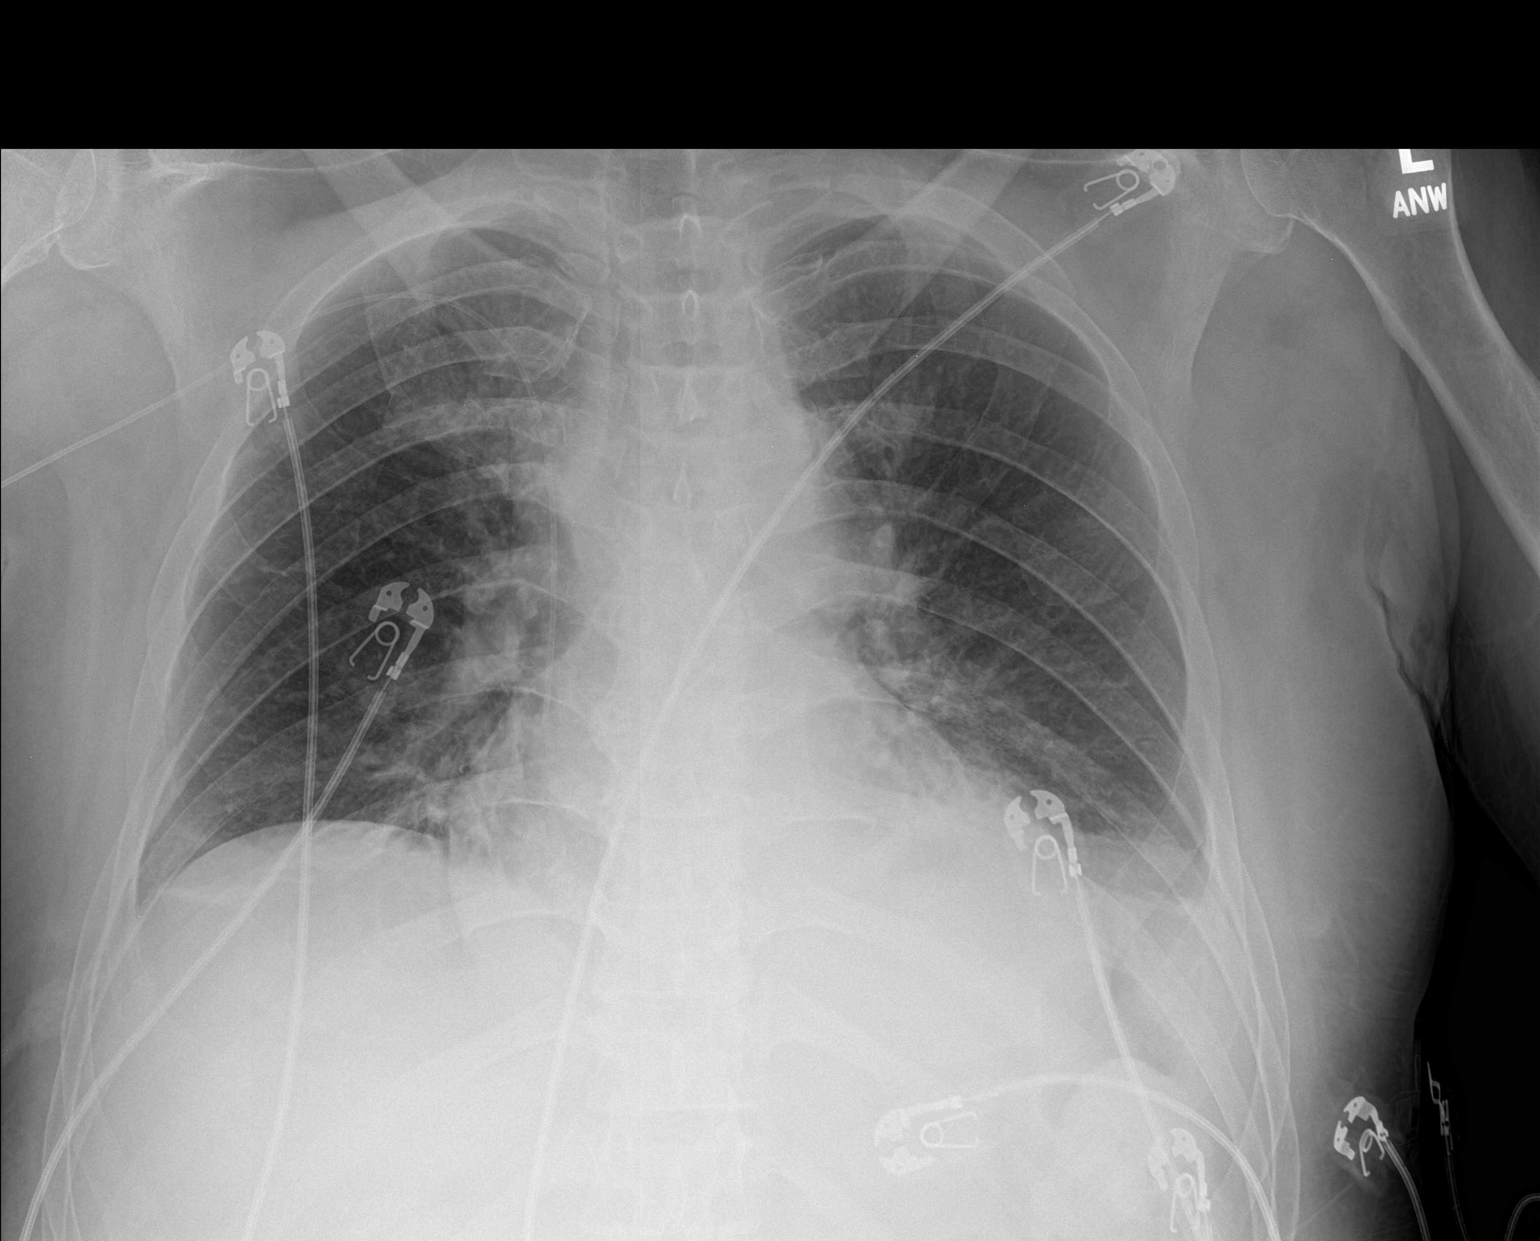

[1 of 1 positions shown; findings below may reference images not displayed]

FINDINGS: Haziness in the bilateral lower chest with indistinct/streaky
density, also seen previously. Normal range heart size for low
volume portable technique. Right PICC with tip at the upper right
atrium. No edema, effusion, or pneumothorax.
IMPRESSION: Stable low volume chest with atelectasis or infiltrates at the
bases.

## 2021-08-06 IMAGING — CT CT ANGIO CHEST
2 of 8 series · 17 of 46 positions shown · IV contrast (APPLIED)
Comparison: CT chest, [DATE] MR thoracic spine, [DATE]

CLINICAL DATA: Chest pain, shortness of breath, rule out PE

EXAM:
CT ANGIOGRAPHY CHEST WITH CONTRAST
TECHNIQUE: Multidetector CT imaging of the chest was performed using the
standard protocol during bolus administration of intravenous
contrast. Multiplanar CT image reconstructions and MIPs were
obtained to evaluate the vascular anatomy.
CONTRAST:  80mL OMNIPAQUE IOHEXOL 350 MG/ML SOLN

[Series 6: thins · axial · 0.71mm/px · z∈[-9,+214]mm · 14 of 363 slices shown]
[im 22/363  lung]
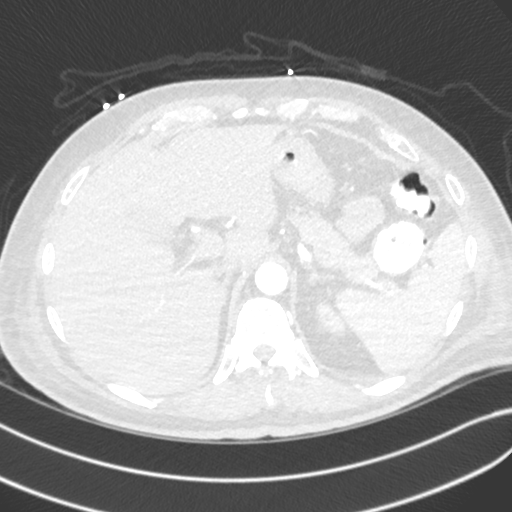
[im 43/363  soft-tissue]
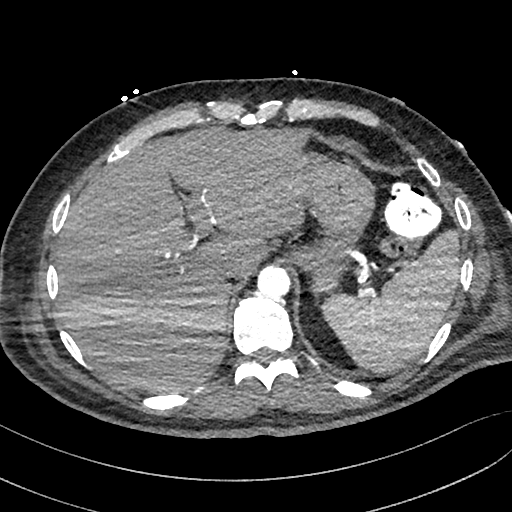
[im 64/363  lung]
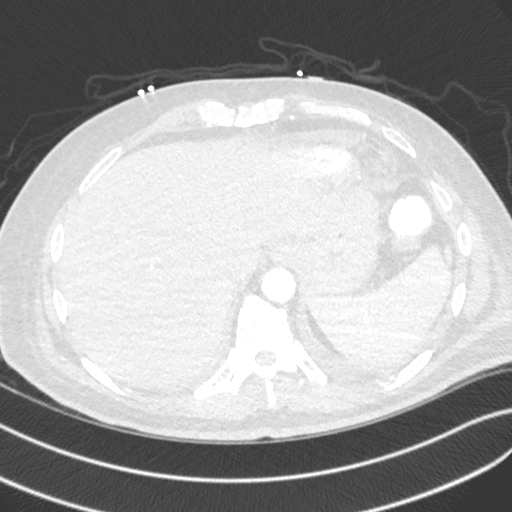
[im 107/363  soft-tissue]
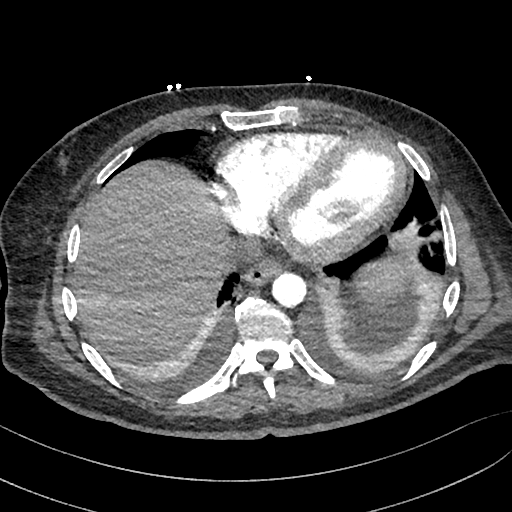
[im 128/363  lung]
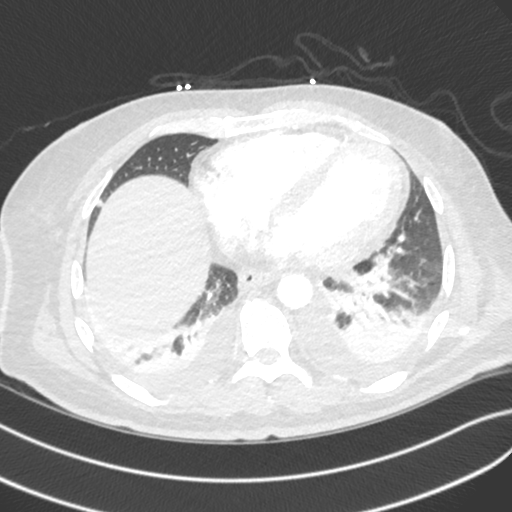
[im 150/363  soft-tissue]
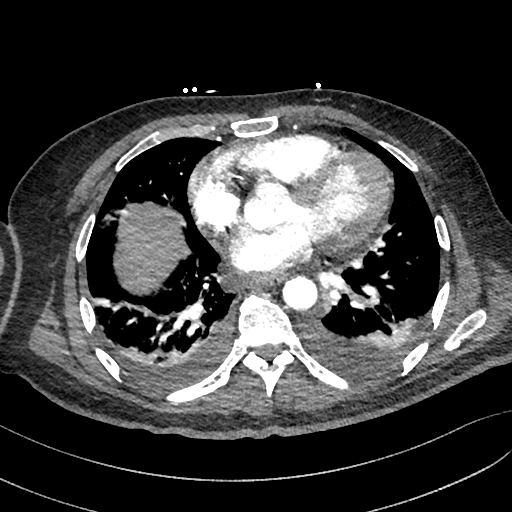
[im 171/363  lung]
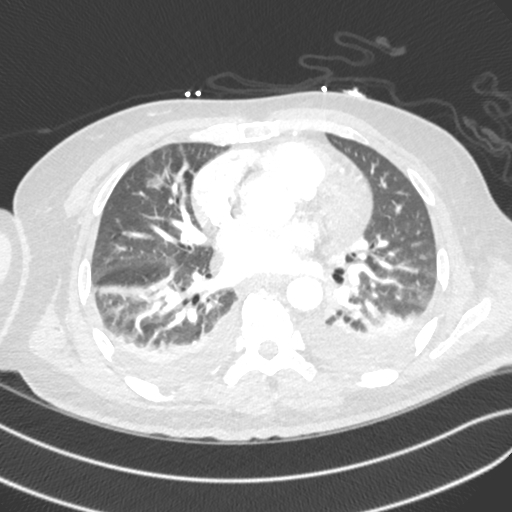
[im 192/363  soft-tissue]
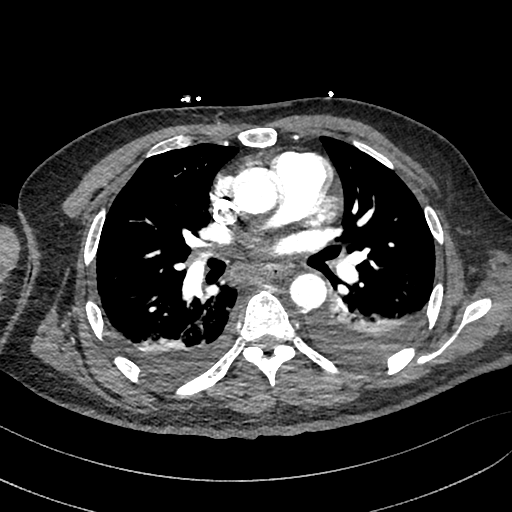
[im 213/363  lung]
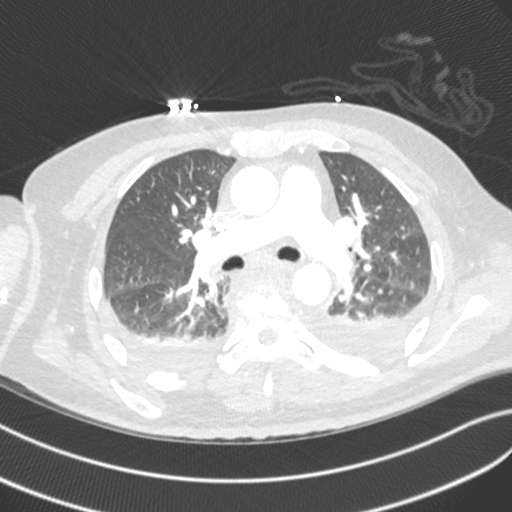
[im 235/363  soft-tissue]
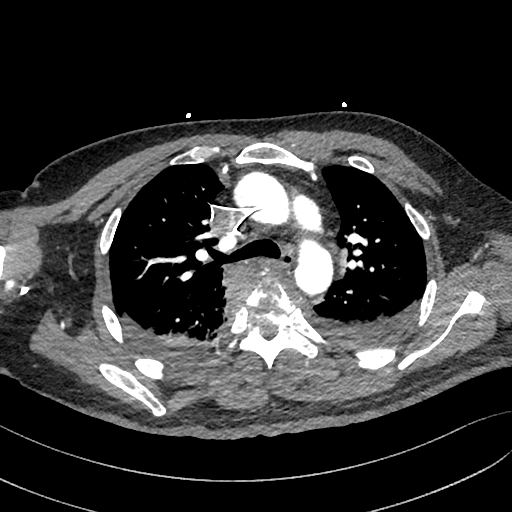
[im 277/363  lung]
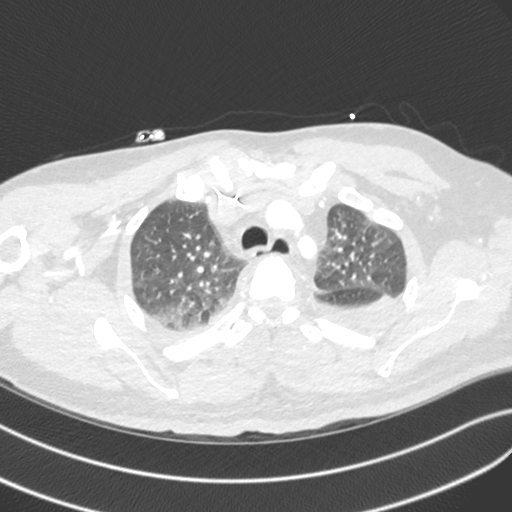
[im 299/363  soft-tissue]
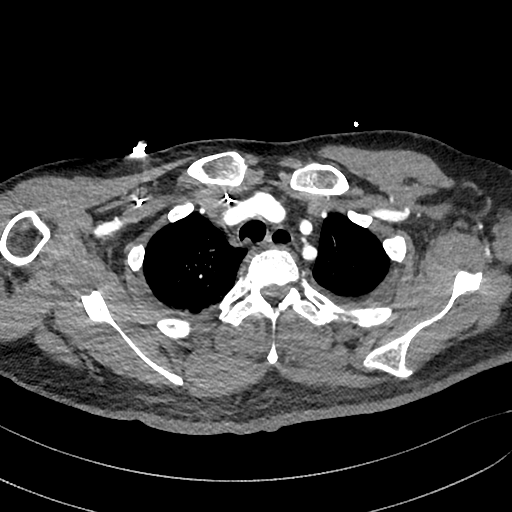
[im 320/363  lung]
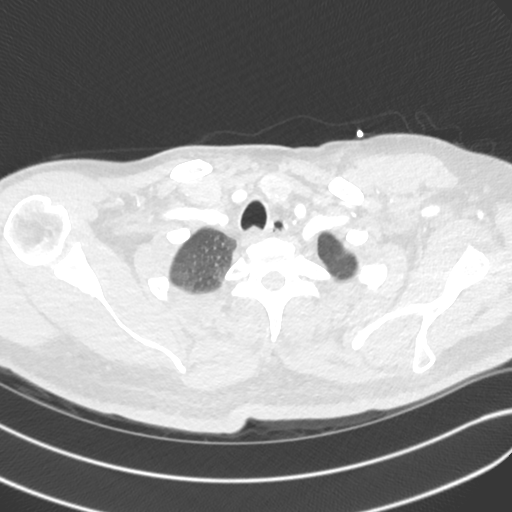
[im 341/363  soft-tissue]
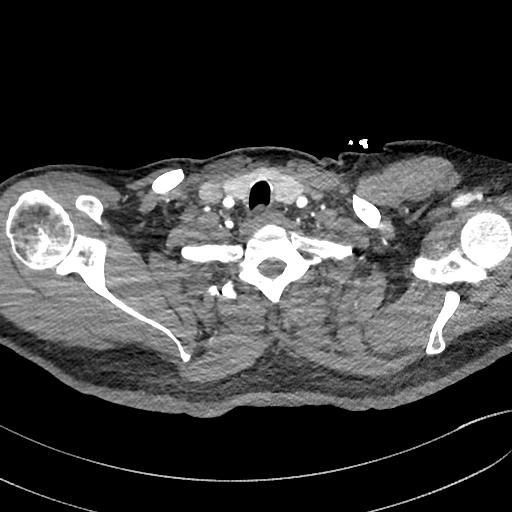

[Series 7: cor · coronal · 0.53mm/px · 3 of 121 slices shown]
[im 31/121  soft-tissue]
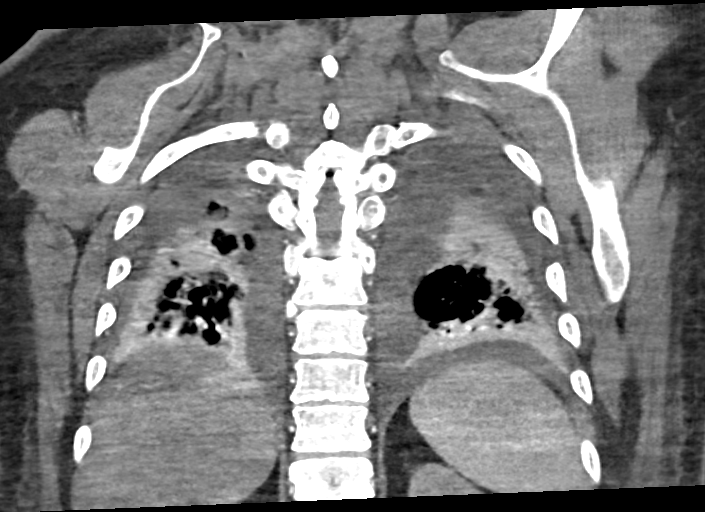
[im 61/121  soft-tissue]
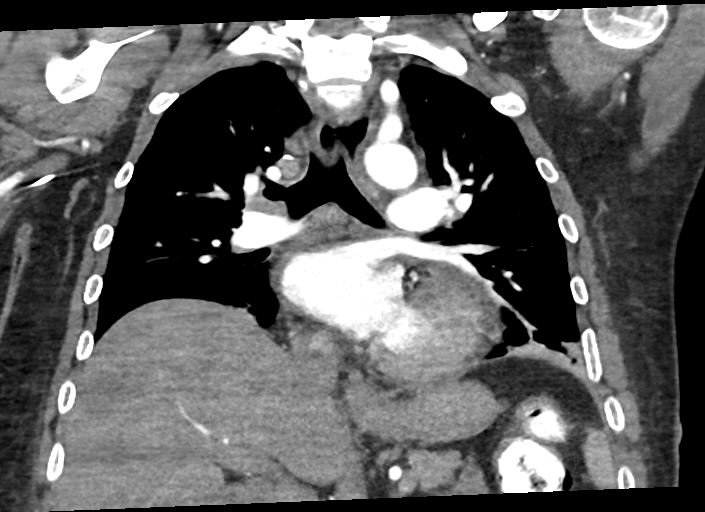
[im 91/121  soft-tissue]
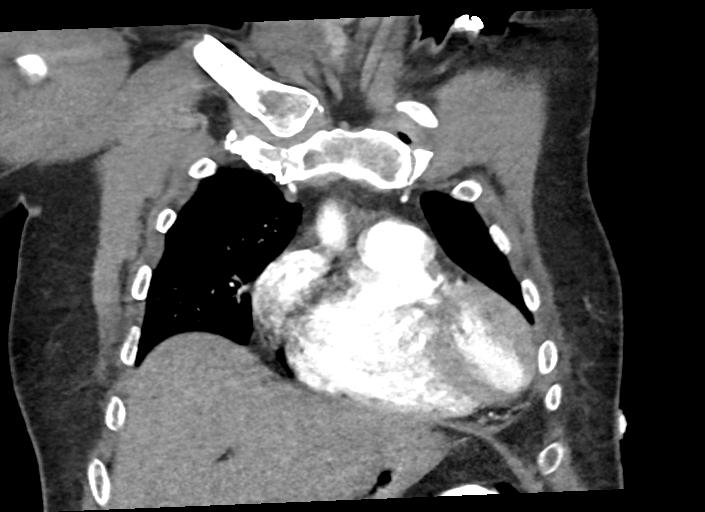

[17 of 46 positions shown; findings below may reference images not displayed]

FINDINGS: Cardiovascular: Satisfactory opacification of the pulmonary arteries
to the segmental level. No evidence of pulmonary embolism.
Cardiomegaly. Left coronary artery calcifications. Trace pericardial
effusion. Right upper extremity PICC, tip position near the superior
cavoatrial junction.

Mediastinum/Nodes: Unchanged enlarged right hilar and subcarinal
lymph nodes, largest right hilar nodes measuring up to 2.3 x 2.1 cm
(series 4, image 50). Thyroid gland, trachea, and esophagus
demonstrate no significant findings.

Lungs/Pleura: Moderate bilateral pleural effusions associated
atelectasis or consolidation, new compared to prior examination.

Upper Abdomen: No acute abnormality.

Musculoskeletal: No chest wall abnormality. There is substantial
interval worsening of destructive lytic change about the T4-T5 disc
space (series 10, image 94). Overlying paravertebral soft tissue
thickening (series 4, image 50).

Review of the MIP images confirms the above findings.
IMPRESSION: 1. Negative examination for pulmonary embolism.
2. Moderate bilateral pleural effusions and associated atelectasis
or consolidation, new compared to prior examination.
3. There is substantial interval worsening of destructive lytic
change about the T4-T5 disc space. Overlying paravertebral soft
tissue thickening. Findings are consistent with worsened discitis
osteomyelitis but in general better assessed by same-day MR.
4. Unchanged enlarged right hilar and subcarinal lymph nodes, likely
reactive.
5. Cardiomegaly and coronary artery disease.

## 2021-08-06 MED ORDER — CYCLOBENZAPRINE HCL 10 MG PO TABS
10.0000 mg | ORAL_TABLET | Freq: Three times a day (TID) | ORAL | Status: DC
  Administered 2021-08-06 – 2021-08-09 (×5): 10 mg via ORAL
  Filled 2021-08-06 (×5): qty 1

## 2021-08-06 MED ORDER — METOPROLOL TARTRATE 5 MG/5ML IV SOLN: 5.0000 mg | Freq: Four times a day (QID) | INTRAVENOUS | Status: DC | PRN

## 2021-08-06 MED ORDER — HYDROMORPHONE HCL 1 MG/ML IJ SOLN
1.0000 mg | Freq: Once | INTRAMUSCULAR | Status: AC | PRN
  Administered 2021-08-06: 1 mg via INTRAVENOUS
  Filled 2021-08-06: qty 1

## 2021-08-06 MED ORDER — ACETAMINOPHEN 650 MG RE SUPP
650.0000 mg | Freq: Four times a day (QID) | RECTAL | Status: DC | PRN
  Filled 2021-08-06: qty 1

## 2021-08-06 MED ORDER — ADULT MULTIVITAMIN W/MINERALS CH
1.0000 | ORAL_TABLET | Freq: Every day | ORAL | Status: DC
Start: 2021-08-06 — End: 2021-08-12
  Administered 2021-08-08: 1 via ORAL
  Filled 2021-08-06 (×3): qty 1

## 2021-08-06 MED ORDER — MUPIROCIN 2 % EX OINT
1.0000 "application " | TOPICAL_OINTMENT | Freq: Two times a day (BID) | CUTANEOUS | Status: DC
  Administered 2021-08-06 – 2021-08-30 (×44): 1 via TOPICAL
  Filled 2021-08-06 (×4): qty 22

## 2021-08-06 MED ORDER — SODIUM CHLORIDE 0.9% FLUSH: 3.0000 mL | INTRAVENOUS | Status: DC | PRN

## 2021-08-06 MED ORDER — CEFAZOLIN SODIUM-DEXTROSE 2-4 GM/100ML-% IV SOLN
2.0000 g | Freq: Three times a day (TID) | INTRAVENOUS | Status: DC
  Administered 2021-08-06 – 2021-08-10 (×11): 2 g via INTRAVENOUS
  Filled 2021-08-06 (×14): qty 100

## 2021-08-06 MED ORDER — ALPRAZOLAM 0.25 MG PO TABS
0.2500 mg | ORAL_TABLET | Freq: Two times a day (BID) | ORAL | Status: DC | PRN
  Administered 2021-08-06 – 2021-08-07 (×2): 0.25 mg via ORAL
  Filled 2021-08-06 (×2): qty 1

## 2021-08-06 MED ORDER — SODIUM CHLORIDE 0.9 % IV SOLN
2.0000 g | Freq: Three times a day (TID) | INTRAVENOUS | Status: DC
  Administered 2021-08-06: 2 g via INTRAVENOUS
  Filled 2021-08-06: qty 2

## 2021-08-06 MED ORDER — TRAZODONE HCL 50 MG PO TABS
100.0000 mg | ORAL_TABLET | Freq: Every day | ORAL | Status: DC
  Administered 2021-08-06 – 2021-08-07 (×2): 100 mg via ORAL
  Filled 2021-08-06 (×2): qty 1

## 2021-08-06 MED ORDER — OXYCODONE HCL 5 MG PO TABS
10.0000 mg | ORAL_TABLET | ORAL | Status: DC | PRN
  Administered 2021-08-06 – 2021-08-09 (×5): 10 mg via ORAL
  Filled 2021-08-06 (×6): qty 2

## 2021-08-06 MED ORDER — ONDANSETRON HCL 4 MG/2ML IJ SOLN
4.0000 mg | Freq: Four times a day (QID) | INTRAMUSCULAR | Status: DC | PRN
  Administered 2021-08-08 (×3): 4 mg via INTRAVENOUS
  Filled 2021-08-06 (×3): qty 2

## 2021-08-06 MED ORDER — GADOBUTROL 1 MMOL/ML IV SOLN
9.0000 mL | Freq: Once | INTRAVENOUS | Status: AC | PRN
  Administered 2021-08-06: 9 mL via INTRAVENOUS

## 2021-08-06 MED ORDER — LACTULOSE 10 GM/15ML PO SOLN
20.0000 g | Freq: Two times a day (BID) | ORAL | Status: DC
  Administered 2021-08-06 – 2021-08-11 (×4): 20 g via ORAL
  Filled 2021-08-06 (×5): qty 30

## 2021-08-06 MED ORDER — INSULIN ASPART 100 UNIT/ML IJ SOLN
0.0000 [IU] | Freq: Three times a day (TID) | INTRAMUSCULAR | Status: DC
  Administered 2021-08-06 – 2021-08-07 (×2): 1 [IU] via SUBCUTANEOUS
  Administered 2021-08-07: 3 [IU] via SUBCUTANEOUS
  Administered 2021-08-08: 2 [IU] via SUBCUTANEOUS
  Administered 2021-08-08: 3 [IU] via SUBCUTANEOUS
  Administered 2021-08-08: 2 [IU] via SUBCUTANEOUS
  Administered 2021-08-09 (×2): 5 [IU] via SUBCUTANEOUS
  Administered 2021-08-09: 3 [IU] via SUBCUTANEOUS
  Administered 2021-08-10: 9 [IU] via SUBCUTANEOUS
  Administered 2021-08-11: 2 [IU] via SUBCUTANEOUS

## 2021-08-06 MED ORDER — VANCOMYCIN HCL 1750 MG/350ML IV SOLN
1750.0000 mg | INTRAVENOUS | Status: DC
  Filled 2021-08-06 (×2): qty 350

## 2021-08-06 MED ORDER — ACETAMINOPHEN 325 MG PO TABS
650.0000 mg | ORAL_TABLET | Freq: Four times a day (QID) | ORAL | Status: DC | PRN
  Administered 2021-08-12: 650 mg via ORAL
  Filled 2021-08-06: qty 2

## 2021-08-06 MED ORDER — HYDROMORPHONE HCL 1 MG/ML IJ SOLN
0.5000 mg | INTRAMUSCULAR | Status: DC | PRN
  Administered 2021-08-06 (×3): 0.5 mg via INTRAVENOUS
  Filled 2021-08-06 (×4): qty 1

## 2021-08-06 MED ORDER — CHLORHEXIDINE GLUCONATE CLOTH 2 % EX PADS
6.0000 | MEDICATED_PAD | Freq: Every day | CUTANEOUS | Status: DC
  Administered 2021-08-08 – 2021-08-30 (×24): 6 via TOPICAL

## 2021-08-06 MED ORDER — SODIUM CHLORIDE 0.9% FLUSH
3.0000 mL | Freq: Two times a day (BID) | INTRAVENOUS | Status: DC
  Administered 2021-08-06 – 2021-08-22 (×18): 3 mL via INTRAVENOUS

## 2021-08-06 MED ORDER — CHLORPROMAZINE HCL 25 MG PO TABS
25.0000 mg | ORAL_TABLET | Freq: Three times a day (TID) | ORAL | Status: DC | PRN
  Administered 2021-08-06 – 2021-08-09 (×2): 25 mg via ORAL
  Filled 2021-08-06 (×3): qty 1

## 2021-08-06 MED ORDER — ALBUTEROL SULFATE HFA 108 (90 BASE) MCG/ACT IN AERS
1.0000 | INHALATION_SPRAY | RESPIRATORY_TRACT | Status: DC | PRN
  Administered 2021-08-25: 1 via RESPIRATORY_TRACT
  Filled 2021-08-06: qty 6.7

## 2021-08-06 MED ORDER — GLUCERNA SHAKE PO LIQD
237.0000 mL | Freq: Three times a day (TID) | ORAL | Status: DC
  Administered 2021-08-06: 237 mL via ORAL

## 2021-08-06 MED ORDER — IOHEXOL 350 MG/ML SOLN
80.0000 mL | Freq: Once | INTRAVENOUS | Status: AC | PRN
  Administered 2021-08-06: 80 mL via INTRAVENOUS

## 2021-08-06 MED ORDER — COLLAGENASE 250 UNIT/GM EX OINT
1.0000 "application " | TOPICAL_OINTMENT | Freq: Every day | CUTANEOUS | Status: DC
  Administered 2021-08-06: 1 via TOPICAL
  Filled 2021-08-06: qty 30

## 2021-08-06 MED ORDER — AMLODIPINE BESYLATE 10 MG PO TABS
10.0000 mg | ORAL_TABLET | Freq: Every day | ORAL | Status: DC
  Administered 2021-08-06 – 2021-08-11 (×5): 10 mg via ORAL
  Filled 2021-08-06 (×3): qty 1
  Filled 2021-08-06: qty 2
  Filled 2021-08-06: qty 1

## 2021-08-06 MED ORDER — SENNOSIDES-DOCUSATE SODIUM 8.6-50 MG PO TABS
1.0000 | ORAL_TABLET | Freq: Two times a day (BID) | ORAL | Status: DC
  Administered 2021-08-06 – 2021-08-11 (×4): 1 via ORAL
  Filled 2021-08-06 (×4): qty 1

## 2021-08-06 MED ORDER — OXYCODONE HCL 5 MG PO TABS: 5.0000 mg | ORAL_TABLET | ORAL | Status: DC | PRN

## 2021-08-06 MED ORDER — ONDANSETRON HCL 4 MG PO TABS: 4.0000 mg | ORAL_TABLET | Freq: Four times a day (QID) | ORAL | Status: DC | PRN

## 2021-08-06 MED ORDER — SODIUM CHLORIDE 0.9 % IV SOLN
250.0000 mL | INTRAVENOUS | Status: DC | PRN
  Administered 2021-08-12 – 2021-08-22 (×4): 250 mL via INTRAVENOUS

## 2021-08-06 MED ORDER — HYDRALAZINE HCL 50 MG PO TABS
50.0000 mg | ORAL_TABLET | Freq: Three times a day (TID) | ORAL | Status: DC
  Administered 2021-08-06 – 2021-08-11 (×11): 50 mg via ORAL
  Filled 2021-08-06 (×12): qty 1

## 2021-08-06 MED ORDER — GABAPENTIN 300 MG PO CAPS
300.0000 mg | ORAL_CAPSULE | Freq: Two times a day (BID) | ORAL | Status: DC
  Administered 2021-08-06 – 2021-08-10 (×6): 300 mg via ORAL
  Filled 2021-08-06 (×8): qty 1

## 2021-08-06 NOTE — Consult Note (Signed)
Chief Complaint  Back pain  History of Present Illness  Jose [name] is a 45 y.o. male seen in consultation in the emergency department after presenting with severe uncontrolled back pain.  Patient has a history of chronic kidney disease, diabetes, and a left great toe infection since April.  He was admitted to the hospital at [facility] for about 2 weeks, discharged a few days ago for the same complaint of back pain.  He was found to have MSSA bacteremia with T4-T5 osteomyelitis discitis.  There was some concern for multiple myeloma at that time and he did undergo serologic testing as well as IR guided biopsy which was negative for malignancy.  Patient was ultimately discharged home with home antibiotics and a PICC line however had continued worsening midthoracic back pain.  This morning he was barely able to get up out of bed with his walker and he was brought back to the emergency department by his wife.  Patient does complain of mid thoracic low back pain with radiation around into the ribs.  He does not complain of any paresthesias in the lower extremities.  No changes in bladder function.  He does not complain of any real weakness in the legs, however his movement is severely limited by pain.  Past Medical History   Past Medical History:  Diagnosis Date   GAD (generalized anxiety disorder)    Hyperlipidemia    Macular degeneration, bilateral    Retinopathy    Type II diabetes mellitus with complication, uncontrolled    retinopathy, neuropathy, microalbuminuria    Past Surgical History   Past Surgical History:  Procedure Laterality Date   APPENDECTOMY     BUBBLE STUDY  07/29/2021   Procedure: BUBBLE STUDY;  Surgeon: Jerline Pain, MD;  Location: [place];  Service: Cardiovascular;;   HERNIA REPAIR     IR FLUORO GUIDED NEEDLE PLC ASPIRATION/INJECTION LOC  07/28/2021   TEE WITHOUT CARDIOVERSION N/A 07/29/2021   Procedure: TRANSESOPHAGEAL ECHOCARDIOGRAM (TEE);  Surgeon:  Jerline Pain, MD;  Location: [facility];  Service: Cardiovascular;  Laterality: N/A;   TRIGGER FINGER RELEASE Right 10/25/2019   Procedure: RIGHT INDEX FINGER RELEASE TRIGGER FINGER/A-1 PULLEY;  Surgeon: Daryll Brod, MD;  [place];  Service: Orthopedics;  Laterality: Right;  IV REGIONAL FOREARM BLOCK    Social History   Social History   Tobacco Use   Smoking status: Former    Packs/day: 1.00    Years: 20.00    Pack years: 20.00    Types: Cigarettes    Quit date: 07/2020    Years since quitting: 1.0   Smokeless tobacco: Never  Vaping Use   Vaping Use: Never used  Substance Use Topics   Alcohol use: Not Currently   Drug use: Never    Medications   Prior to Admission medications   Medication Sig Start Date End Date Taking? Authorizing Provider  acetaminophen (TYLENOL) 500 MG tablet Take 500 mg by mouth every 6 (six) hours as needed for mild pain, fever or headache.   Yes [provider]  albuterol (VENTOLIN HFA) 108 (90 Base) MCG/ACT inhaler Inhale 1 puff into the lungs every 4 (four) hours as needed for wheezing or shortness of breath. 05/27/21  Yes [provider]  ALPRAZolam (XANAX) 0.25 MG tablet Take 0.25 mg by mouth 2 (two) times daily as needed for anxiety. 06/12/21  Yes [provider]  amLODipine (NORVASC) 10 MG tablet Take 1 tablet (10 mg total) by mouth  daily. 08/03/21  Yes Cristal Deer, MD  Ascorbic Acid (VITAMIN C PO) Take 1 tablet by mouth daily.   Yes [provider]  atorvastatin (LIPITOR) 40 MG tablet Take 1 tablet (40 mg total) by mouth daily. 03/12/19  Yes Jacelyn Pi, Lilia Argue, MD  ceFAZolin (ANCEF) IVPB Inject 2 g into the vein every 8 (eight) hours. Indication:  Osteomyelitis  First Dose: No Last Day of Therapy:  09/04/2022  Labs - Once weekly:  CBC/D and BMP, Labs - Every other week:  ESR and CRP Method of administration: IV Push Method of administration may be changed at the discretion of  home infusion pharmacist based upon assessment of the patient and/or caregiver's ability to self-administer the medication ordered. 07/31/21  Yes Cristal Deer, MD  chlorproMAZINE (THORAZINE) 25 MG tablet Take 1 tablet (25 mg total) by mouth 3 (three) times daily as needed for hiccoughs. 08/03/21  Yes Cristal Deer, MD  collagenase (SANTYL) ointment Apply topically daily. 08/03/21  Yes Cristal Deer, MD  cyclobenzaprine (FLEXERIL) 10 MG tablet Take 1 tablet (10 mg total) by mouth 3 (three) times daily. 08/03/21  Yes Cristal Deer, MD  Dulaglutide (TRULICITY) 3 SL/3.7DS SOPN Inject 3 mg as directed once a week. Patient taking differently: Inject 3 mg as directed once a week. thursday 06/11/21  Yes Renato Shin, MD  feeding supplement, GLUCERNA SHAKE, (GLUCERNA SHAKE) LIQD Take 237 mLs by mouth 3 (three) times daily between meals. 08/03/21  Yes Cristal Deer, MD  gabapentin (NEURONTIN) 300 MG capsule TAKE 1 TO 2 CAPSULES(300 TO 600 MG) BY MOUTH AT BEDTIME Patient taking differently: Take 300 mg by mouth 2 (two) times daily. 12/01/19  Yes Jacelyn Pi, Lilia Argue, MD  glimepiride (AMARYL) 1 MG tablet Take 1 tablet (1 mg total) by mouth daily with breakfast. 01/08/21  Yes Renato Shin, MD  hydrALAZINE (APRESOLINE) 50 MG tablet Take 1 tablet (50 mg total) by mouth 3 (three) times daily. 08/03/21  Yes Cristal Deer, MD  lactulose (CHRONULAC) 10 GM/15ML solution Take 30 mLs (20 g total) by mouth 2 (two) times daily. 08/03/21  Yes Cristal Deer, MD  Multiple Vitamin (MULTIVITAMIN ADULT PO) Take 1 tablet by mouth daily.   Yes [provider]  mupirocin ointment (BACTROBAN) 2 % Apply 1 application topically 2 (two) times daily. 08/03/21  Yes Cristal Deer, MD  Oxycodone HCl 10 MG TABS Take 10 mg by mouth every 3 (three) hours as needed for pain. 08/03/21  Yes [provider]  senna-docusate (SENOKOT-S) 8.6-50 MG tablet Take 1 tablet by mouth 2 (two) times daily. 08/03/21  Yes  Cristal Deer, MD  tadalafil (CIALIS) 20 MG tablet TK 1 T PO  PO QD PRN FOR ERECTILE DYSFUNCTION Patient taking differently: Take 20 mg by mouth daily as needed for erectile dysfunction. 07/20/19  Yes Jacelyn Pi, Irma M, MD  tobramycin (TOBREX) 0.3 % ophthalmic solution Place 1 drop into both eyes See admin instructions. Begin 1 day prior to treatment and continue the day of treatment and for one full day after treatment. 07/05/18  Yes [provider]  traZODone (DESYREL) 100 MG tablet TAKE 1 TABLET(100 MG) BY MOUTH AT BEDTIME AS NEEDED FOR SLEEP Patient taking differently: Take 100 mg by mouth at bedtime. 03/05/21  Yes Ravi, Himabindu, MD  VITAMIN D PO Take 1 tablet by mouth daily.   Yes [provider]  DULoxetine (CYMBALTA) 60 MG capsule TAKE 1 CAPSULE(60 MG) BY MOUTH DAILY Patient not taking: No sig reported 03/05/21  Ravi, Himabindu, MD  Ranibizumab (LUCENTIS) 0.3 MG/0.05ML SOLN 1 Dose by Intravitreal route every 3 (three) months.    [provider]    Allergies   Allergies  Allergen Reactions   Cranberry Itching    Review of Systems  ROS  Neurologic Exam  Awake, alert, oriented Memory and concentration grossly intact Speech fluent, appropriate CN grossly intact Motor exam: Upper Extremities Deltoid Bicep Tricep Grip  Right 5/5 5/5 5/5 5/5  Left 5/5 5/5 5/5 5/5   Lower Extremities IP Quad PF DF EHL  Right 5/5 5/5 5/5 5/5 5/5  Left 5/5 5/5 5/5 5/5 5/5   Sensation grossly intact to LT  Imaging  MRI of the thoracic spine with today's date was personally reviewed.  This demonstrates osteomyelitis involving T4 and T5 with discitis involving the intervening disc space.  There is also fracture of the T5 superior endplate with compression and retropulsion of a portion of the T5 body into the canal eccentric to the left with resultant severe stenosis and compression of the left hemicord.  There is also enhancement of the ventral epidural space  suggestive of possible phlegmon in this region as well.  I do not see any clear signal change within the cord.  Impression  - 45 y.o. male with recently diagnosed T4-5 osteomyelitis discitis presenting back with worsening back pain and MRI demonstrating worsening T5 fracture now with significant stenosis and spinal cord compression.  He does remain neurologically intact with pain limited mobility.  Plan  -We will plan on proceeding with T4-5 laminectomy possible transpedicular approach for decompression of spinal cord and debridement of abscess. Surgery is scheduled for Friday, 08/07/21 at 2:30p. - Admit to medical service - Pain control - Cont IV abx - NPO after midnight. Ok for diet today  I have reviewed the imaging findings with the patient and his wife at the bedside.  We have discussed treatment options from a surgical standpoint given the new fracture and spinal stenosis.  I did offer the possibility of continued expectant observation given the fact that he is neurologically intact as well as the option for surgical decompression.  Patient and his wife are both in agreement that they prefer to proceed with surgery.  I did review with him the details of the surgery as well as the expected postoperative course and recovery.  I was very clear in stating that surgery is to relieve pressure on the spinal cord and decrease the risk of neurologic compromise from worsening fracture.  I did tell them that surgery is not intended to improve pain related to the osteomyelitis discitis.  We did also discuss risks of surgery including injury to the nerves or spinal cord, bleeding, and progression of infection.  Patient and his wife appear to understand our discussion and the patient provided consent. All their questions today were answered.   Consuella Lose, MD [facility]

## 2021-08-06 NOTE — ED Triage Notes (Signed)
D/c last Saturday from hospital; diagnosed with osteomyelitis and pneumonia, c/o back and shoulder pain;  pain worsening  since Saturday; intermittent chills; getting ancef thru [facility] at home; 250 fentanyl given pta; 22 ga rh  162/95 94 ra Hr 112

## 2021-08-06 NOTE — ED Notes (Signed)
Per infectious disease it is ok to use PICC unless blood cultures come back positive.

## 2021-08-06 NOTE — ED Notes (Signed)
Rectal temp, 98.4

## 2021-08-06 NOTE — H&P (Signed)
History and Physical    Jose [name] YJE:[id number] DOB: December 02, 1975 DOA: 08/06/2021  PCP: [name], NP Consultants:  endocrinology: Dr. Loanne Drilling, nephrology: Dr. Hollie Salk Patient coming from:  Home - lives with wife and daughter  Chief Complaint: worsening back pain.   HPI: Jose [name] is a 45 y.o. 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 He was discharged home with antibiotics until September 04, 2021. He states since his discharge home on Monday he has continued to get worse. He states the pain in his back has just gotten progressively worse and he was unable to walk or sit up this AM. He states the pain is mainly between his shoulder blades, worse on the right. Movement makes it worse. Pain is rated as an 8/10 and described as sharp and piercing pain. He was given oral pain pills, but these have not touched the pain recently.   He has had no fever, but has had chills at home. He has not had a BM since Saturday and is taking stool softners and lactulose. He has had no problems urinating. He has no loss of sensation around his buttocks and no urinary incontinence.   He has a headache. Denies any chest pain, palpitations, stomach pain, N/V/D, leg swelling. He has no weakness in his legs or arms. He has shortness of breath when pain is severe, but no cough.     ED Course: vitals: afebrile, bp: 185/92, HR: 114, RR: 18, oxygen: 97% on room air.  Pertinent labs: WBC 13.1, hemoglobin 8.4, platelets 518, creatinine 1.50, alkaline phosphatase 163, lactic acid 0.6, CTA negative for PE.  Moderate bilateral pleural effusions and associated atelectasis or consolidation, new compared to prior exam. CT abdo/pelvis: distended bladder up to 19cm, anasarca, large burden of stool  MRI thoracic spine: Progressive  discitis/osteomyelitis.  New moderate to severe left eccentric canal stenosis at T5 with cord deformity.  Multiple small fluid collections in the left posterior paraspinal phlegmon could represent tiny abscesses.  Cultures obtained.  Bladder scan pending Neurosurgery consulted and patient started on cefepime and Vanco per pharmacy consult.  TRH was asked to admit.   Review of Systems: As per HPI; otherwise review of systems reviewed and negative.   Ambulatory Status:  Ambulates with cane     Past Medical History:  Diagnosis Date   GAD (generalized anxiety disorder)    Hyperlipidemia    Macular degeneration, bilateral    Retinopathy    Type II diabetes mellitus with complication, uncontrolled    retinopathy, neuropathy, microalbuminuria    Past Surgical History:  Procedure Laterality Date   APPENDECTOMY     BUBBLE STUDY  07/29/2021   Procedure: BUBBLE STUDY;  Surgeon: Jerline Pain, MD;  Location: [place];  Service: Cardiovascular;;   HERNIA REPAIR     IR FLUORO GUIDED NEEDLE PLC ASPIRATION/INJECTION LOC  07/28/2021   TEE WITHOUT CARDIOVERSION N/A 07/29/2021   Procedure: TRANSESOPHAGEAL ECHOCARDIOGRAM (TEE);  Surgeon: Jerline Pain, MD;  Location[facility] ENDOSCOPY;  Service: Cardiovascular;  Laterality: N/A;   TRIGGER FINGER RELEASE Right 10/25/2019   Procedure: RIGHT INDEX FINGER RELEASE TRIGGER FINGER/A-1 PULLEY;  Surgeon: Daryll Brod, MD;  Location: [place];  Service: Orthopedics;  Laterality: Right;  IV REGIONAL FOREARM BLOCK    Social History   Socioeconomic History   Marital status: Significant  Other    Spouse name: Not on file   Number of children: 2   Years of education: Not on file   Highest education level: Bachelor's degree (e.g., BA, AB, BS)  Occupational History   Occupation: Multimedia programmer: [employer]  Tobacco Use   Smoking status: Former    Packs/day: 1.00    Years: 20.00    Pack years: 20.00    Types:  Cigarettes    Quit date: 07/2020    Years since quitting: 1.0   Smokeless tobacco: Never  Vaping Use   Vaping Use: Never used  Substance and Sexual Activity   Alcohol use: Not Currently   Drug use: Never   Sexual activity: Yes    Partners: Female    Birth control/protection: None    Comment: with monogamous partner  Other Topics Concern   Not on file  Social History Narrative   Pt has lived [name] in [place]. Lives at home with partner, 2 kids, 8 cats, and 1 dog.    Social Determinants of Health   Financial Resource Strain: Not on file  Food Insecurity: Not on file  Transportation Needs: Not on file  Physical Activity: Not on file  Stress: Not on file  Social Connections: Not on file  Intimate Partner Violence: Not on file    Allergies  Allergen Reactions   Cranberry Itching    Family History  Problem Relation Age of Onset   Diabetes Mother    Hyperlipidemia Mother    Stroke Mother    Diabetes Father    Hyperlipidemia Brother    Stroke Brother    ADD / ADHD Brother    ADD / ADHD Son     Prior to Admission medications   Medication Sig Start Date End Date Taking? Authorizing Provider  acetaminophen (TYLENOL) 500 MG tablet Take 500 mg by mouth every 6 (six) hours as needed for mild pain, fever or headache.   Yes [provider]  albuterol (VENTOLIN HFA) 108 (90 Base) MCG/ACT inhaler Inhale 1 puff into the lungs every 4 (four) hours as needed for wheezing or shortness of breath. 05/27/21  Yes [provider]  ALPRAZolam (XANAX) 0.25 MG tablet Take 0.25 mg by mouth 2 (two) times daily as needed for anxiety. 06/12/21  Yes [provider]  amLODipine (NORVASC) 10 MG tablet Take 1 tablet (10 mg total) by mouth daily. 08/03/21  Yes Cristal Deer, MD  Ascorbic Acid (VITAMIN C PO) Take 1 tablet by mouth daily.   Yes [provider]  atorvastatin (LIPITOR) 40 MG tablet Take 1 tablet (40 mg total) by mouth daily. 03/12/19  Yes  Jacelyn Pi, Lilia Argue, MD  ceFAZolin (ANCEF) IVPB Inject 2 g into the vein every 8 (eight) hours. Indication:  Osteomyelitis  First Dose: No Last Day of Therapy:  09/04/2022  Labs - Once weekly:  CBC/D and BMP, Labs - Every other week:  ESR and CRP Method of administration: IV Push Method of administration may be changed at the discretion of home infusion pharmacist based upon assessment of the patient and/or caregiver's ability to self-administer the medication ordered. 07/31/21  Yes Cristal Deer, MD  chlorproMAZINE (THORAZINE) 25 MG tablet Take 1 tablet (25 mg total) by mouth 3 (three) times daily as needed for hiccoughs. 08/03/21  Yes Cristal Deer, MD  collagenase (SANTYL) ointment Apply topically daily. 08/03/21  Yes Cristal Deer, MD  cyclobenzaprine (FLEXERIL) 10 MG tablet Take 1 tablet (10 mg  total) by mouth 3 (three) times daily. 08/03/21  Yes Cristal Deer, MD  Dulaglutide (TRULICITY) 3 FX/9.0WI SOPN Inject 3 mg as directed once a week. Patient taking differently: Inject 3 mg as directed once a week. thursday 06/11/21  Yes Renato Shin, MD  feeding supplement, GLUCERNA SHAKE, (GLUCERNA SHAKE) LIQD Take 237 mLs by mouth 3 (three) times daily between meals. 08/03/21  Yes Cristal Deer, MD  gabapentin (NEURONTIN) 300 MG capsule TAKE 1 TO 2 CAPSULES(300 TO 600 MG) BY MOUTH AT BEDTIME Patient taking differently: Take 300 mg by mouth 2 (two) times daily. 12/01/19  Yes Jacelyn Pi, Lilia Argue, MD  glimepiride (AMARYL) 1 MG tablet Take 1 tablet (1 mg total) by mouth daily with breakfast. 01/08/21  Yes Renato Shin, MD  hydrALAZINE (APRESOLINE) 50 MG tablet Take 1 tablet (50 mg total) by mouth 3 (three) times daily. 08/03/21  Yes Cristal Deer, MD  lactulose (CHRONULAC) 10 GM/15ML solution Take 30 mLs (20 g total) by mouth 2 (two) times daily. 08/03/21  Yes Cristal Deer, MD  Multiple Vitamin (MULTIVITAMIN ADULT PO) Take 1 tablet by mouth daily.   Yes [provider]   mupirocin ointment (BACTROBAN) 2 % Apply 1 application topically 2 (two) times daily. 08/03/21  Yes Cristal Deer, MD  Oxycodone HCl 10 MG TABS Take 10 mg by mouth every 3 (three) hours as needed for pain. 08/03/21  Yes [provider]  senna-docusate (SENOKOT-S) 8.6-50 MG tablet Take 1 tablet by mouth 2 (two) times daily. 08/03/21  Yes Cristal Deer, MD  tadalafil (CIALIS) 20 MG tablet TK 1 T PO  PO QD PRN FOR ERECTILE DYSFUNCTION Patient taking differently: Take 20 mg by mouth daily as needed for erectile dysfunction. 07/20/19  Yes Jacelyn Pi, Irma M, MD  tobramycin (TOBREX) 0.3 % ophthalmic solution Place 1 drop into both eyes See admin instructions. Begin 1 day prior to treatment and continue the day of treatment and for one full day after treatment. 07/05/18  Yes [provider]  traZODone (DESYREL) 100 MG tablet TAKE 1 TABLET(100 MG) BY MOUTH AT BEDTIME AS NEEDED FOR SLEEP Patient taking differently: Take 100 mg by mouth at bedtime. 03/05/21  Yes Ravi, Himabindu, MD  VITAMIN D PO Take 1 tablet by mouth daily.   Yes [provider]  DULoxetine (CYMBALTA) 60 MG capsule TAKE 1 CAPSULE(60 MG) BY MOUTH DAILY Patient not taking: No sig reported 03/05/21   Einar Grad, Himabindu, MD  Ranibizumab (LUCENTIS) 0.3 MG/0.05ML SOLN 1 Dose by Intravitreal route every 3 (three) months.    [provider]    Physical Exam: Vitals:   08/06/21 1300 08/06/21 1315 08/06/21 1340 08/06/21 1400  BP: (!) 178/97 (!) 184/95 (!) 181/95   Pulse: (!) 113 (!) 114 (!) 113 (!) 112  Resp: _0 Temp:      TempSrc:      SpO2: 94% 93% 92% 93%  Weight:      Height:         General:  Appears calm, but in pain. Eyes:  PERRL, EOMI, normal lids, iris ENT:  grossly normal hearing, lips & tongue, mmm; appropriate dentition Neck:  no LAD, masses or thyromegaly; no carotid bruits Cardiovascular:  RRR, no m/r/g. No LE edema.  Respiratory:   CTA bilaterally with no  wheezes/rales/rhonchi.  Normal respiratory effort. Abdomen:  soft, NT, ND, NABS Back:   unable to examine  Skin:  no rash or induration seen on limited exam. Diabetic ulcer on left big  toe. Has pilonidal cyst (reported)  Musculoskeletal:  grossly normal tone BUE/BLE, limited rom due to pain, no bony abnormality Lower extremity:  No LE edema.  Left foot ulcer on big toe no drainge, dry.  2+ distal pulses. Psychiatric:  grossly normal mood and affect, speech fluent and appropriate, AOx3 Neurologic:  CN 2-12 grossly intact, moves all extremities in coordinated fashion, sensation intact    Radiological Exams on Admission: Independently reviewed - see discussion in A/P where applicable  CT Angio Chest PE W and/or Wo Contrast  Result Date: 08/06/2021 CLINICAL DATA:  Chest pain, shortness of breath, rule out PE EXAM: CT ANGIOGRAPHY CHEST WITH CONTRAST TECHNIQUE: Multidetector CT imaging of the chest was performed using the standard protocol during bolus administration of intravenous contrast. Multiplanar CT image reconstructions and MIPs were obtained to evaluate the vascular anatomy. CONTRAST:  40mL OMNIPAQUE IOHEXOL 350 MG/ML SOLN COMPARISON:  CT chest, 07/22/2021 MR thoracic spine, 08/06/2021 FINDINGS: Cardiovascular: Satisfactory opacification of the pulmonary arteries to the segmental level. No evidence of pulmonary embolism. Cardiomegaly. Left coronary artery calcifications. Trace pericardial effusion. Right upper extremity PICC, tip position near the superior cavoatrial junction. Mediastinum/Nodes: Unchanged enlarged right hilar and subcarinal lymph nodes, largest right hilar nodes measuring up to 2.3 x 2.1 cm (series 4, image 50). Thyroid gland, trachea, and esophagus demonstrate no significant findings. Lungs/Pleura: Moderate bilateral pleural effusions associated atelectasis or consolidation, new compared to prior examination. Upper Abdomen: No acute abnormality. Musculoskeletal: No chest wall  abnormality. There is substantial interval worsening of destructive lytic change about the T4-T5 disc space (series 10, image 94). Overlying paravertebral soft tissue thickening (series 4, image 50). Review of the MIP images confirms the above findings. IMPRESSION: 1. Negative examination for pulmonary embolism. 2. Moderate bilateral pleural effusions and associated atelectasis or consolidation, new compared to prior examination. 3. There is substantial interval worsening of destructive lytic change about the T4-T5 disc space. Overlying paravertebral soft tissue thickening. Findings are consistent with worsened discitis osteomyelitis but in general better assessed by same-day MR. 4. Unchanged enlarged right hilar and subcarinal lymph nodes, likely reactive. 5. Cardiomegaly and coronary artery disease. Electronically Signed   By: Delanna Ahmadi M.D.   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 Findings in the report and this addendum discussed with Dr. Sherry Ruffing via telephone at 11:55 AM. Electronically Signed   By: Margaretha Sheffield M.D.   On: 08/06/2021 11:59   Result Date: 08/06/2021 CLINICAL DATA:  Mid-back pain Known osteomyelitis of the T-spine, recent biopsy completed EXAM: MRI THORACIC WITHOUT AND WITH CONTRAST TECHNIQUE: Multiplanar and multiecho pulse sequences of the thoracic spine were obtained without and with intravenous contrast. CONTRAST:  21mL GADAVIST GADOBUTROL 1 MMOL/ML IV SOLN COMPARISON:  MRI thoracic spine 07/23/2021. FINDINGS: Alignment: T5 bony retropulsion is detailed below. Otherwise, normal alignment. Vertebrae: Redemonstrated abnormal bone marrow edema and enhancement involving the entirety of the T4 and T5 vertebral bodies as well as the T5 posterior elements. Progressive T5 height loss (50-60%),  compatible with pathologic fracture. New 4 mm of bony retropulsion along the superior T5 endplate. Cord:  No definite cord signal abnormality. Paraspinal and other soft tissues: There is increased paraspinal enhancement and edema surrounding the T4 and T5 levels. There is new enhancement in the posterior left paraspinal soft tissues with tiny (subcentimeter) fluid collections in  this region (for example series 15, image 13). Disc levels: At T4-T5, there is new/increased epidural enhancement which measures up to 6 mm in thickness ventrally. Abnormal enhancement involves bilateral T4-T5 foramina. Additionally, there is new 4 mm of bony retropulsion along the superior T5 endplate. Resulting new moderate to severe left eccentric canal stenosis at T4 with cord deformity (series 11, image 15). IMPRESSION: 1. Redemonstrated abnormal bone marrow signal and enhancement involving the T4 and T5 levels with increased surrounding paraspinal phlegmon and new epidural phlegmon (6 mm thick ventrally). Interval T5 pathologic fracture (50-60% height loss) with new 4 mm of bony retropulsion. Constellation of findings results in new moderate to severe left eccentric canal stenosis at T4 with cord deformity. 2. Overall, findings are most likely secondary to progressive discitis/osteomyelitis. Superimposed osseous metastatic disease or multiple myeloma is not excluded by imaging. Recommend correlation with the results of reported recent biopsy. 3. Multiple small (subcentimeter) fluid collections in the left posterior paraspinal phlegmon could represent tiny abscesses. Electronically Signed: By: Margaretha Sheffield M.D. On: 08/06/2021 11:49   CT ABDOMEN PELVIS W CONTRAST  Result Date: 08/06/2021 CLINICAL DATA:  Abdominal distension, chills ongoing antibiotic therapy for osteomyelitis EXAM: CT ABDOMEN AND PELVIS WITH CONTRAST TECHNIQUE: Multidetector CT imaging of the abdomen and pelvis was performed using the standard protocol  following bolus administration of intravenous contrast. CONTRAST:  5m OMNIPAQUE IOHEXOL 350 MG/ML SOLN, additional oral enteric contrast COMPARISON:  07/23/2021 FINDINGS: Lower chest: Please see separately reported examination of the chest. Hepatobiliary: No solid liver abnormality is seen. No gallstones, gallbladder wall thickening, or biliary dilatation. Pancreas: Unremarkable. No pancreatic ductal dilatation or surrounding inflammatory changes. Spleen: Normal in size without significant abnormality. Adrenals/Urinary Tract: Adrenal glands are unremarkable. Kidneys are normal, without renal calculi, solid lesion, or hydronephrosis. Distended urinary bladder, measuring up to 19.0 cm. Stomach/Bowel: Stomach is within normal limits. Appendix is not clearly visualized. No evidence of bowel wall thickening, distention, or inflammatory changes. Generally large burden of stool and stool balls throughout the colon. Vascular/Lymphatic: Aortic atherosclerosis. No enlarged abdominal or pelvic lymph nodes. Reproductive: No mass or other significant abnormality. Other: Anasarca.  No abdominopelvic ascites. Musculoskeletal: No acute or significant osseous findings. IMPRESSION: 1. Generally large burden of stool and stool balls throughout the colon. 2. Distended urinary bladder, measuring up to 19.0 cm. Correlate for urinary retention. 3. Anasarca. Aortic Atherosclerosis (ICD10-I70.0). Electronically Signed   By: ADelanna AhmadiM.D.   On: 08/06/2021 13:37   DG Chest Portable 1 View  Result Date: 08/06/2021 CLINICAL DATA:  Chest pain EXAM: PORTABLE CHEST 1 VIEW COMPARISON:  Ten days ago FINDINGS: Haziness in the bilateral lower chest with indistinct/streaky density, also seen previously. Normal range heart size for low volume portable technique. Right PICC with tip at the upper right atrium. No edema, effusion, or pneumothorax. IMPRESSION: Stable low volume chest with atelectasis or infiltrates at the bases. Electronically  Signed   By: JJorje GuildM.D.   On: 08/06/2021 10:39    EKG: Independently reviewed.  Sinus tachycardia with rate 111; nonspecific ST changes with no evidence of acute ischemia   Labs on Admission: I have personally reviewed the available labs and imaging studies at the time of the admission.  Pertinent labs:  WBC 13.1,  hemoglobin 8.4,  platelets 518,  creatinine 1.50, (1.3 in 04/2021, but up to 1.9-2.3 during last hospital stay)  alkaline phosphatase 163,  lactic acid 0.6,    Assessment/Plan Principal Problem:   Osteomyelitis of thoracic spine [place] [age][place]  with history of MSSA bacteremia (07/22/21 admit) and known osteomyelitis (biopsy performed) of his thoracic spine presenting with worsening pain found to have substantial interval worsening of his osteomyelitis as well as T5 pathologic fracture and new moderate to severe left eccentric canal stenosis at T5 with cord deformity. -Admit to MedSurg -Neurosurgery consulted with plans for surgery tomorrow afternoon  -N.p.o. after midnight -Pain control with oxycodone and IV Dilaudid may need to increase if pain not controlled.   -IV antibiotics with Vanco and cefepime.  ID has been consulted follow-up on recommendations -Blood cultures pending.  -TEE performed on 07/29/2021 at previous hospitalization.   Active Problems: Bilateral pleural effusions vs. Consolidation -New finding and moderate in size. Associated atelectasis or consolidation.   Favor atelectasis as he has been in the bed for so long and has no cough, shortness of breath, dyspnea.  -received vanc/cefepime and continued this unless will be changed by ID. Will cover for HCAP.  -tailor antibiotics per day team ? Need for thoracentesis.  -IS if can tolerate   Constipation Has not had BM since Saturday, likely opioid induced.  -possible surgery today vs. Tomorrow  -continue lactulose, senna, escalate as needed     Type 2 diabetes mellitus with diabetic  polyneuropathy, without long-term current use of insulin (HCC) -a1c of 7.5 in 04/2021 -hold oral meds/trulicity  -sensitive SSI and accuchecks per protocol  -will monitor accuchecks more frequently while NPO     Essential hypertension -elevated readings. Has not had home medication -restart home medication with PRN iv parameters -control pain     CKD (chronic kidney disease) stage 3, GFR 30-59 ml/min (HCC) Appears stable and improved from last hospitalization.  Continue to monitor  Intake/output     Hyperlipidemia -continue statin    GAD (generalized anxiety disorder) -xanax BID prn.     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)  Stable, no signs of infection.  Seen By podiatry at hospitalization on 07/24/21.  Recommended daily wound care with mupirocin ointment and dry sterile dressing changes. -Continue Bactroban and dry sterile dressing changes -wound Care consult   Pilonidal cyst Can not examine at this time.   Covid 19 Positive in July, < 8 weeks ago, no longer needs airborne precautions.   anemia Iron studies done in 9/22 Percent saturation of 9%.  Mixed picture, likely IDA with ACD.  Continue to monitor   Body mass index is 26.5 kg/m.    Level of care: Med-Surg DVT prophylaxis:   SCD for possible surgery  Code Status:  full - confirmed with patient Family Communication: None present; I spoke with the patient's wife by telephone at the time of admission. Courtney Paris: [phone] Disposition Plan:  The patient is from: home  Anticipated d/c is to: per day team Requires inpatient hospitalization and is at significant risk of worsening, requires constant monitoring, assessment, IV treatment and intervention and MDM with specialists.  Patient is currently: acutely ill Consults called: neurosurgery and ID  Admission status:  inpatient    Orma Flaming MD [facility]   How to contact the [facility] Attending or Consulting provider [place] or covering provider  during after hours [place], for this patient?  Check the care team in [facility] and look for a) attending/consulting [facility] provider listed and b) the [facility] team listed Log into [email or web address] and use [place]'s universal password to access. If you do not have the password, please contact the hospital operator. Locate the [facility] provider you are looking for  under [facility] and page to a number that you can be directly reached. If you still have difficulty reaching the provider, please page the [facility] (Director on Call) for the Hospitalists listed on amion for assistance.   08/06/2021, 2:54 PM

## 2021-08-06 NOTE — Progress Notes (Addendum)
Pharmacy Antibiotic Note  Jose [name] is a 45 y.o. male admitted on 08/06/2021 with  osteomyelitis .  Patient discharged last Saturday after being admitted for osteomyelitis and MSSA bacteremia. Returns to ED today with worsening back and shoulder pain and chills. PTA Ancef via PICC line.   Pharmacy consulted to stop vancomycin and cefepime and start cefazolin. Neurosurgery plans for T4-5 laminectomy for decompression of spinal cord and debridement of abscess on 10/14.   Plan: Stop vancomycin and cefepime  Start cefazolin 2g q8h  Monitor renal function, cultures, and clinical progression  Height: '5\' 11"'$  (180.3 cm) Weight: 86.2 kg (190 lb) IBW/kg (Calculated) : 75.3  Temp (24hrs), Avg:98.4 F (36.9 C), Min:98.2 F (36.8 C), Max:98.5 F (36.9 C)  Recent Labs  Lab 07/31/21 0531 08/01/21 1049 08/02/21 1421 08/03/21 0541 08/06/21 0937  WBC 13.3* 19.5* 16.9*  --  13.1*  CREATININE 1.73* 2.08* 2.32* 2.20* 1.50*  LATICACIDVEN  --   --   --   --  0.6     Estimated Creatinine Clearance: 64.8 mL/min (A) (by C-G formula based on SCr of 1.5 mg/dL (H)).    Allergies  Allergen Reactions   Cranberry Itching    Antimicrobials this admission: Cefepime 10/13>>10/13 Vancomycin 10/13>>10/13 Cefazolin 10/13 >>    Microbiology results: 10/13 BCx: 10/13 UCx:    Thank you for allowing pharmacy to be a part of this patient's care.  Cristela Felt, PharmD, BCPS Clinical Pharmacist 08/06/2021 6:26 PM

## 2021-08-06 NOTE — Progress Notes (Signed)
NEW ADMISSION NOTE New Admission Note:   Arrival Method: stretcher Mental Orientation: A&OX4 Telemetry: 5M05 Assessment: Completed Skin:intact, scratches on arms and legs, puncture site upper mid back, diabetic ulcer Left great toe IV: R hand, PICC Right upper arm Pain:10/10 Tubes:none Safety Measures: Safety Fall Prevention Plan has been given, discussed and signed Admission: Completed [facility] [place] Orientation: Patient has been orientated to the room, unit and staff.  Family:none at bedside  Orders have been reviewed and implemented. Will continue to monitor the patient. Call light has been placed within reach and bed alarm has been activated.   Jose Merwin S Kenidy Crossland, RN

## 2021-08-06 NOTE — ED Provider Notes (Addendum)
[facility] EMERGENCY DEPARTMENT Provider Note   CSN: [id number] Arrival date & time: 08/06/21  4709     History No chief complaint on file.   Jose [name] is a 45 y.o. male that was recently hospitalized with osteomyelitis and MSSA bacteremia  presents with back and shoulder pain that has been worsening since hospital discharge on 10/10. PMH significant for T2DM with retinopathy/neuropathy/microalbuminuria, CAD, HTN, HLD< CKD stage 3, COVID infection in July 2022.  Patient was recently hospitalized from 9/28-10/10 for concern for pathologic fracture of T6 consistent with osteomyelitis as well as MSSA bacteremia. He was treated with Vancomycin and Cefazolin and was discharged with home IV therapy.   Since his hospitalization he has been having worsening of his back pain as well as right chest pain.  He says the right chest pain has been on and off for the last 6 weeks and so the infection started but has worsened since he left the hospital.  Is also associated with significant shortness of breath that is worsened gradually.  He is having diaphoresis and palpitations.  Denies any headache, vision changes, N/V/D, fever or chills.  He states that he has not had good pain management for his back pain, which has limited his mobilization significantly.  He has significant pain with trying to sit/stand/walk.  He is also not had a bowel movement since his discharge on 10/10, and reports that while in the hospital he required multiple bowel medications as well as an enema.  Patient states that he has a pilonidal cyst that is currently draining.  HPI     Past Medical History:  Diagnosis Date   GAD (generalized anxiety disorder)    Hyperlipidemia    Macular degeneration, bilateral    Retinopathy    Type II diabetes mellitus with complication, uncontrolled    retinopathy, neuropathy, microalbuminuria    Patient Active Problem List   Diagnosis Date Noted   Diabetic ulcer  of left great toe ([place])    MSSA bacteremia 07/23/2021   Constipation 07/23/2021   Lytic bone lesions on xray 07/22/2021   AKI (acute kidney injury) ([place]) 05/19/2021   Anemia 05/19/2021   Nausea and vomiting 05/19/2021   Elevated blood pressure reading 05/19/2021   COVID-19 05/18/2021   Bilateral carpal tunnel syndrome 01/23/2020   Numbness 12/12/2019   Microalbuminuria due to type 2 diabetes mellitus ([place]) 07/20/2019   GAD (generalized anxiety disorder) 06/18/2019   Adult ADHD 06/18/2019   Trigger middle finger of left hand 06/11/2019   Diabetic retinopathy ([place]) 07/13/2018   Type 2 diabetes mellitus with diabetic polyneuropathy, without long-term current use of insulin ([place]) 05/17/2018   Tobacco use disorder 05/17/2018   Hyperlipidemia 05/17/2018    Past Surgical History:  Procedure Laterality Date   APPENDECTOMY     BUBBLE STUDY  07/29/2021   Procedure: BUBBLE STUDY;  Surgeon: Jerline Pain, MD;  Location: [facility];  Service: Cardiovascular;;   HERNIA REPAIR     IR FLUORO GUIDED NEEDLE PLC ASPIRATION/INJECTION LOC  07/28/2021   TEE WITHOUT CARDIOVERSION N/A 07/29/2021   Procedure: TRANSESOPHAGEAL ECHOCARDIOGRAM (TEE);  Surgeon: Jerline Pain, MD;  Location[facility];  Service: Cardiovascular;  Laterality: N/A;   TRIGGER FINGER RELEASE Right 10/25/2019   Procedure: RIGHT INDEX FINGER RELEASE TRIGGER FINGER/A-1 PULLEY;  Surgeon: Daryll Brod, MD;  Location: [facility];  Service: Orthopedics;  Laterality: Right;  IV REGIONAL FOREARM BLOCK       Family History  Problem Relation Age  of Onset   Diabetes Mother    Hyperlipidemia Mother    Stroke Mother    Diabetes Father    Hyperlipidemia Brother    Stroke Brother    ADD / ADHD Brother    ADD / ADHD Son     Social History   Tobacco Use   Smoking status: Former    Packs/day: 1.00    Years: 20.00    Pack years: 20.00    Types: Cigarettes    Quit date: 07/2020    Years since quitting: 1.0    Smokeless tobacco: Never  Vaping Use   Vaping Use: Never used  Substance Use Topics   Alcohol use: Not Currently   Drug use: Never    Home Medications Prior to Admission medications   Medication Sig Start Date End Date Taking? Authorizing Provider  acetaminophen (TYLENOL) 500 MG tablet Take 500 mg by mouth every 6 (six) hours as needed for mild pain, fever or headache.   Yes [provider]  albuterol (VENTOLIN HFA) 108 (90 Base) MCG/ACT inhaler Inhale 1 puff into the lungs every 4 (four) hours as needed for wheezing or shortness of breath. 05/27/21  Yes [provider]  ALPRAZolam (XANAX) 0.25 MG tablet Take 0.25 mg by mouth 2 (two) times daily as needed for anxiety. 06/12/21  Yes [provider]  amLODipine (NORVASC) 10 MG tablet Take 1 tablet (10 mg total) by mouth daily. 08/03/21  Yes Cristal Deer, MD  Ascorbic Acid (VITAMIN C PO) Take 1 tablet by mouth daily.   Yes [provider]  atorvastatin (LIPITOR) 40 MG tablet Take 1 tablet (40 mg total) by mouth daily. 03/12/19  Yes Jacelyn Pi, Lilia Argue, MD  ceFAZolin (ANCEF) IVPB Inject 2 g into the vein every 8 (eight) hours. Indication:  Osteomyelitis  First Dose: No Last Day of Therapy:  09/04/2022  Labs - Once weekly:  CBC/D and BMP, Labs - Every other week:  ESR and CRP Method of administration: IV Push Method of administration may be changed at the discretion of home infusion pharmacist based upon assessment of the patient and/or caregiver's ability to self-administer the medication ordered. 07/31/21  Yes Cristal Deer, MD  chlorproMAZINE (THORAZINE) 25 MG tablet Take 1 tablet (25 mg total) by mouth 3 (three) times daily as needed for hiccoughs. 08/03/21  Yes Cristal Deer, MD  collagenase (SANTYL) ointment Apply topically daily. 08/03/21  Yes Cristal Deer, MD  cyclobenzaprine (FLEXERIL) 10 MG tablet Take 1 tablet (10 mg total) by mouth 3 (three) times daily. 08/03/21  Yes Cristal Deer,  MD  Dulaglutide (TRULICITY) 3 TJ/0.3ES SOPN Inject 3 mg as directed once a week. Patient taking differently: Inject 3 mg as directed once a week. thursday 06/11/21  Yes Renato Shin, MD  feeding supplement, GLUCERNA SHAKE, (GLUCERNA SHAKE) LIQD Take 237 mLs by mouth 3 (three) times daily between meals. 08/03/21  Yes Cristal Deer, MD  gabapentin (NEURONTIN) 300 MG capsule TAKE 1 TO 2 CAPSULES(300 TO 600 MG) BY MOUTH AT BEDTIME Patient taking differently: Take 300 mg by mouth 2 (two) times daily. 12/01/19  Yes Jacelyn Pi, Lilia Argue, MD  glimepiride (AMARYL) 1 MG tablet Take 1 tablet (1 mg total) by mouth daily with breakfast. 01/08/21  Yes Renato Shin, MD  hydrALAZINE (APRESOLINE) 50 MG tablet Take 1 tablet (50 mg total) by mouth 3 (three) times daily. 08/03/21  Yes Cristal Deer, MD  lactulose (CHRONULAC) 10 GM/15ML solution Take 30 mLs (20 g total) by mouth 2 (two)  times daily. 08/03/21  Yes Cristal Deer, MD  Multiple Vitamin (MULTIVITAMIN ADULT PO) Take 1 tablet by mouth daily.   Yes [provider]  mupirocin ointment (BACTROBAN) 2 % Apply 1 application topically 2 (two) times daily. 08/03/21  Yes Cristal Deer, MD  Oxycodone HCl 10 MG TABS Take 10 mg by mouth every 3 (three) hours as needed for pain. 08/03/21  Yes [provider]  senna-docusate (SENOKOT-S) 8.6-50 MG tablet Take 1 tablet by mouth 2 (two) times daily. 08/03/21  Yes Cristal Deer, MD  tadalafil (CIALIS) 20 MG tablet TK 1 T PO  PO QD PRN FOR ERECTILE DYSFUNCTION Patient taking differently: Take 20 mg by mouth daily as needed for erectile dysfunction. 07/20/19  Yes Jacelyn Pi, Irma M, MD  tobramycin (TOBREX) 0.3 % ophthalmic solution Place 1 drop into both eyes See admin instructions. Begin 1 day prior to treatment and continue the day of treatment and for one full day after treatment. 07/05/18  Yes [provider]  traZODone (DESYREL) 100 MG tablet TAKE 1 TABLET(100 MG) BY MOUTH AT BEDTIME  AS NEEDED FOR SLEEP Patient taking differently: Take 100 mg by mouth at bedtime. 03/05/21  Yes Ravi, Himabindu, MD  VITAMIN D PO Take 1 tablet by mouth daily.   Yes [provider]  DULoxetine (CYMBALTA) 60 MG capsule TAKE 1 CAPSULE(60 MG) BY MOUTH DAILY Patient not taking: No sig reported 03/05/21   Einar Grad, Himabindu, MD  Ranibizumab (LUCENTIS) 0.3 MG/0.05ML SOLN 1 Dose by Intravitreal route every 3 (three) months.    [provider]    Allergies    Cranberry  Review of Systems   Review of Systems  Constitutional:  Positive for activity change, chills, diaphoresis and fatigue. Negative for fever.  HENT:  Negative for congestion, sore throat and trouble swallowing.   Eyes:  Negative for pain and visual disturbance.  Respiratory:  Positive for shortness of breath. Negative for choking and wheezing.   Cardiovascular:  Positive for chest pain and palpitations. Negative for leg swelling.  Gastrointestinal:  Positive for abdominal distention, abdominal pain and constipation. Negative for blood in stool, nausea and vomiting.  Genitourinary:  Negative for decreased urine volume and difficulty urinating.  Musculoskeletal:  Positive for back pain. Negative for neck pain.  Skin:  Positive for wound (biopsy of T-5 previously, pilodonal cyst, left big toe ulcer).  Neurological:  Positive for weakness (secondary to pain). Negative for dizziness, light-headedness and headaches.   Physical Exam Updated Vital Signs BP (!) 181/95   Pulse (!) 112   Temp 98.2 F (36.8 C) (Oral)   Resp 16   Ht _0  (1.803 m)   Wt 86.2 kg   SpO2 93%   BMI 26.50 kg/m   Physical Exam Constitutional:      Appearance: He is ill-appearing.  HENT:     Head: Normocephalic and atraumatic.     Nose: Nose normal.     Mouth/Throat:     Mouth: Mucous membranes are moist.     Pharynx: Oropharynx is clear.  Eyes:     Extraocular Movements: Extraocular movements intact.     Conjunctiva/sclera:  Conjunctivae normal.  Cardiovascular:     Rate and Rhythm: Regular rhythm. Tachycardia present.     Pulses: Normal pulses.     Heart sounds: Normal heart sounds.  Pulmonary:     Effort: Pulmonary effort is normal.     Breath sounds: Normal breath sounds.  Abdominal:     General: There is distension.  Palpations: There is no mass.     Tenderness: There is abdominal tenderness. There is no rebound.  Musculoskeletal:        General: Tenderness present.     Cervical back: Normal range of motion and neck supple.     Right lower leg: No edema.     Left lower leg: No edema.     Comments: Decreased ROM of back in setting of osteomyelitis and pain  Skin:    General: Skin is warm and dry.     Capillary Refill: Capillary refill takes less than 2 seconds.     Comments: Diabetic ulcer at the base of the left big toe, does not appear infected at this time. Biopsy site of thoracic spine without obvious signs of infection.  Neurological:     General: No focal deficit present.     Mental Status: He is alert.   Patient declined further examination of popliteal cyst at this time secondary to pain with rolling over.  He states that it is draining well.  ED Results / Procedures / Treatments   Labs (all labs ordered are listed, but only abnormal results are displayed) Labs Reviewed  RESP PANEL BY RT-PCR (FLU A&B, COVID) ARPGX2 - Abnormal; Notable for the following components:      Result Value   SARS Coronavirus 2 by RT PCR POSITIVE (*)    All other components within normal limits  CBC WITH DIFFERENTIAL/PLATELET - Abnormal; Notable for the following components:   WBC 13.1 (*)    RBC 3.04 (*)    Hemoglobin 8.4 (*)    HCT 26.1 (*)    Platelets 518 (*)    Neutro Abs 10.0 (*)    Monocytes Absolute 1.4 (*)    All other components within normal limits  COMPREHENSIVE METABOLIC PANEL - Abnormal; Notable for the following components:   Sodium 134 (*)    CO2 20 (*)    Glucose, Bld 167 (*)    BUN  21 (*)    Creatinine, Ser 1.50 (*)    Albumin 2.0 (*)    Alkaline Phosphatase 163 (*)    GFR, Estimated 57 (*)    All other components within normal limits  CULTURE, BLOOD (ROUTINE X 2)  CULTURE, BLOOD (ROUTINE X 2)  URINE CULTURE  LACTIC ACID, PLASMA  LIPASE, BLOOD  LACTIC ACID, PLASMA  URINALYSIS, ROUTINE W REFLEX MICROSCOPIC  TROPONIN I (HIGH SENSITIVITY)  TROPONIN I (HIGH SENSITIVITY)    EKG EKG Interpretation  Date/Time:  Thursday August 06 2021 09:32:19 EDT Ventricular Rate:  111 PR Interval:  168 QRS Duration: 96 QT Interval:  309 QTC Calculation: 420 R Axis:   76 Text Interpretation: Sinus tachycardia Nonspecific repol abnormality, diffuse leads Baseline wander in lead(s) V2 When compared to prior ECG, similar appearance. No STEMI Confirmed by Antony Blackbird [phone]) on 08/06/2021 9:34:53 AM  Radiology CT Angio Chest PE W and/or Wo Contrast  Result Date: 08/06/2021 CLINICAL DATA:  Chest pain, shortness of breath, rule out PE EXAM: CT ANGIOGRAPHY CHEST WITH CONTRAST TECHNIQUE: Multidetector CT imaging of the chest was performed using the standard protocol during bolus administration of intravenous contrast. Multiplanar CT image reconstructions and MIPs were obtained to evaluate the vascular anatomy. CONTRAST:  48m OMNIPAQUE IOHEXOL 350 MG/ML SOLN COMPARISON:  CT chest, 07/22/2021 MR thoracic spine, 08/06/2021 FINDINGS: Cardiovascular: Satisfactory opacification of the pulmonary arteries to the segmental level. No evidence of pulmonary embolism. Cardiomegaly. Left coronary artery calcifications. Trace pericardial effusion. Right upper extremity PICC, tip  position near the superior cavoatrial junction. Mediastinum/Nodes: Unchanged enlarged right hilar and subcarinal lymph nodes, largest right hilar nodes measuring up to 2.3 x 2.1 cm (series 4, image 50). Thyroid gland, trachea, and esophagus demonstrate no significant findings. Lungs/Pleura: Moderate bilateral pleural effusions  associated atelectasis or consolidation, new compared to prior examination. Upper Abdomen: No acute abnormality. Musculoskeletal: No chest wall abnormality. There is substantial interval worsening of destructive lytic change about the T4-T5 disc space (series 10, image 94). Overlying paravertebral soft tissue thickening (series 4, image 50). Review of the MIP images confirms the above findings. IMPRESSION: 1. Negative examination for pulmonary embolism. 2. Moderate bilateral pleural effusions and associated atelectasis or consolidation, new compared to prior examination. 3. There is substantial interval worsening of destructive lytic change about the T4-T5 disc space. Overlying paravertebral soft tissue thickening. Findings are consistent with worsened discitis osteomyelitis but in general better assessed by same-day MR. 4. Unchanged enlarged right hilar and subcarinal lymph nodes, likely reactive. 5. Cardiomegaly and coronary artery disease. Electronically Signed   By: Delanna Ahmadi M.D.   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 Findings in the report and this addendum discussed with Dr. Sherry Ruffing via telephone at 11:55 AM. Electronically Signed   By: Margaretha Sheffield M.D.   On: 08/06/2021 11:59   Result Date: 08/06/2021 CLINICAL DATA:  Mid-back pain Known osteomyelitis of the T-spine, recent biopsy completed EXAM: MRI THORACIC WITHOUT AND WITH CONTRAST TECHNIQUE: Multiplanar and multiecho pulse sequences of the thoracic spine were obtained without and with intravenous contrast. CONTRAST:  41m GADAVIST GADOBUTROL 1 MMOL/ML IV SOLN COMPARISON:  MRI thoracic spine 07/23/2021. FINDINGS: Alignment: T5 bony retropulsion is detailed below. Otherwise, normal alignment. Vertebrae: Redemonstrated abnormal bone marrow edema and  enhancement involving the entirety of the T4 and T5 vertebral bodies as well as the T5 posterior elements. Progressive T5 height loss (50-60%), compatible with pathologic fracture. New 4 mm of bony retropulsion along the superior T5 endplate. Cord:  No definite cord signal abnormality. Paraspinal and other soft tissues: There is increased paraspinal enhancement and edema surrounding the T4 and T5 levels. There is new enhancement in the posterior left paraspinal soft tissues with tiny (subcentimeter) fluid collections in this region (for example series 15, image 13). Disc levels: At T4-T5, there is new/increased epidural enhancement which measures up to 6 mm in thickness ventrally. Abnormal enhancement involves bilateral T4-T5 foramina. Additionally, there is new 4 mm of bony retropulsion along the superior T5 endplate. Resulting new moderate to severe left eccentric canal stenosis at T4 with cord deformity (series 11, image 15). IMPRESSION: 1. Redemonstrated abnormal bone marrow signal and enhancement involving the T4 and T5 levels with increased surrounding paraspinal phlegmon and new epidural phlegmon (6 mm thick ventrally). Interval T5 pathologic fracture (50-60% height loss) with new 4 mm of bony retropulsion. Constellation of findings results in new moderate to severe left eccentric canal stenosis at T4 with cord deformity. 2. Overall, findings are most likely secondary to progressive discitis/osteomyelitis. Superimposed osseous metastatic disease or multiple myeloma is not excluded by imaging. Recommend correlation with the results of reported recent biopsy. 3. Multiple small (subcentimeter) fluid collections in the left posterior paraspinal phlegmon could represent tiny abscesses. Electronically Signed: By: FMargaretha SheffieldM.D. On: 08/06/2021 11:49   CT ABDOMEN PELVIS W CONTRAST  Result Date:  08/06/2021 CLINICAL DATA:  Abdominal distension, chills ongoing antibiotic therapy for osteomyelitis EXAM: CT  ABDOMEN AND PELVIS WITH CONTRAST TECHNIQUE: Multidetector CT imaging of the abdomen and pelvis was performed using the standard protocol following bolus administration of intravenous contrast. CONTRAST:  38m OMNIPAQUE IOHEXOL 350 MG/ML SOLN, additional oral enteric contrast COMPARISON:  07/23/2021 FINDINGS: Lower chest: Please see separately reported examination of the chest. Hepatobiliary: No solid liver abnormality is seen. No gallstones, gallbladder wall thickening, or biliary dilatation. Pancreas: Unremarkable. No pancreatic ductal dilatation or surrounding inflammatory changes. Spleen: Normal in size without significant abnormality. Adrenals/Urinary Tract: Adrenal glands are unremarkable. Kidneys are normal, without renal calculi, solid lesion, or hydronephrosis. Distended urinary bladder, measuring up to 19.0 cm. Stomach/Bowel: Stomach is within normal limits. Appendix is not clearly visualized. No evidence of bowel wall thickening, distention, or inflammatory changes. Generally large burden of stool and stool balls throughout the colon. Vascular/Lymphatic: Aortic atherosclerosis. No enlarged abdominal or pelvic lymph nodes. Reproductive: No mass or other significant abnormality. Other: Anasarca.  No abdominopelvic ascites. Musculoskeletal: No acute or significant osseous findings. IMPRESSION: 1. Generally large burden of stool and stool balls throughout the colon. 2. Distended urinary bladder, measuring up to 19.0 cm. Correlate for urinary retention. 3. Anasarca. Aortic Atherosclerosis (ICD10-I70.0). Electronically Signed   By: ADelanna AhmadiM.D.   On: 08/06/2021 13:37   DG Chest Portable 1 View  Result Date: 08/06/2021 CLINICAL DATA:  Chest pain EXAM: PORTABLE CHEST 1 VIEW COMPARISON:  Ten days ago FINDINGS: Haziness in the bilateral lower chest with indistinct/streaky density, also seen previously. Normal range heart size for low volume portable technique. Right PICC with tip at the upper right  atrium. No edema, effusion, or pneumothorax. IMPRESSION: Stable low volume chest with atelectasis or infiltrates at the bases. Electronically Signed   By: JJorje GuildM.D.   On: 08/06/2021 10:39    Procedures Procedures   Medications Ordered in ED Medications  vancomycin (VANCOREADY) IVPB 1750 mg/350 mL (has no administration in time range)  ceFEPIme (MAXIPIME) 2 g in sodium chloride 0.9 % 100 mL IVPB (has no administration in time range)  HYDROmorphone (DILAUDID) injection 1 mg (1 mg Intravenous Given 08/06/21 0934)  gadobutrol (GADAVIST) 1 MMOL/ML injection 9 mL (9 mLs Intravenous Contrast Given 08/06/21 1114)  iohexol (OMNIPAQUE) 350 MG/ML injection 80 mL (80 mLs Intravenous Contrast Given 08/06/21 1253)  HYDROmorphone (DILAUDID) injection 1 mg (1 mg Intravenous Given 08/06/21 1326)    ED Course  I have reviewed the triage vital signs and the nursing notes.  Pertinent labs & imaging results that were available during my care of the patient were reviewed by me and considered in my medical decision making (see chart for details).    MDM Rules/Calculators/A&P                         Jose DENNIEis a 45y.o. male that was recently hospitalized with osteomyelitis and MSSA bacteremia  presents with back and shoulder pain that has been worsening since hospital discharge. PMH significant for T2DM with retinopathy/neuropathy/microalbuminuria, CAD, HTN, HLD< CKD stage 3, COVID infection in July 2022.  Patient presents after hospitalization for osteomyelitis and MSSA bacteremia, currently being treated with IV Ancef and vancomycin.  Patient has had to continued and increasing right chest pain with associated shortness of breath that has been progressively worsening since all of his issues began 6 weeks ago.  Back pain is still also not well controlled and patient  has not been able to sit/walk/stand very much since his discharge.  On arrival to the ED, he was tachycardic with elevated blood  pressures of 185/92.  EKG showed sinus tachycardia. Given recent admission and infections with his current symptoms there are many diagnoses that he be ruled out including PE, pneumonia, seeding of osteomyelitis in other areas (with recent biopsy of T5 done), bowel obstruction, pancreatitis, cardiac origin.  Labs and imaging to be collected include CBC, CMP, lactic acid, blood culture x2, troponin, lipase, COVID panel, UA, urine culture, CT PE, CT abdomen/pelvis, MRI thoracic spine.  Patient has a diabetic ulcer of the left great toe that is not significantly painful and was noted on prior hospitalization. Patient has known osteomyelitis of his thoracic spine (biopsy of T4), considered getting X-ray of the foot but did not feel that it would change management given known infection and bacteremia currently on antibiotics.   MRI showed new T5 pathologic fracture and new epidural phlegmon; new moderate to severe left eccentric canal stenosis at T4 with cord deformity. Patient has not had a bowel movement in several days, cannot rule out that this an associated symptom. Exam was reassuring that the patient was able to move all extremities and had sensation intact. Consulted with Neurosurgery Dr. Kathyrn Sheriff who felt that the patient needs admission for antibiotics and may need surgery for his back. He will come by to see the patient later today, patient was made NPO in case OR is needed. While in the ED, patient received 254mg Fentanyl, Dilaudid 112mx2 for pain.  CT PE showed no PE, moderate bilateral pleural effusions and atelectasis vs consolidation. Worsening destructive lytic change about T4/T5. CT abdomen/pelvis will a large burden of stool and stool balls; distended urinary bladder; anasarca. Bladder scan ordered and concern for urinary retention given the spinal stenosis and constipation issues.   Consulted with pharmacy regarding antibiotics. Patient was receiving Ancef via PICC at home. Will broaden to  vancomycin and cefepime and will likely need ID consult.   Patient to be admitted for further antibiotic management, pain control, and possible surgical intervention with neurosurgery. Wife requests to be contact if patient is going to surgery and he also has living will and POA paperwork.   Final Clinical Impression(s) / ED Diagnoses Final diagnoses:  Osteomyelitis of other site, unspecified type ([place]    LiRobie CreekAlHolloman AFBDO 08/06/21 1412    LiYarrowsburgAlCameronDO 08/06/21 1413    Tegeler, ChGwenyth AllegraMD 08/08/21 1355

## 2021-08-06 NOTE — Consult Note (Signed)
[facility]                                                                                        Patient Identification: Patient Name: Jose [name] MRN: [id number] [place] Date: 08/06/2021  8:37 AM Today's Date: 08/06/2021 Reason for consult: discitis and osteomyelitis  Requesting provider: Gar Gibbon   Active Problems:   Osteomyelitis of thoracic spine ([place])   Antibiotics: cefazolin 9/18 until admit   Lines/Tubes: RT arm PICC   Assessment # Moderate to severe left eccentric canal stenosis at superior T5 2/2 Epidural phlegmon /T5 pathologic fracture with new 4 mm bony retropulsion  Evaluated by Oncology last admission for concerns for malignancy vs MM  with unremarkable work up and presumed to be osteomyelitis with t5 biopsy consistent with osteomyelitis  # Bowel/Bladder Distension   # Bilateral Pleural Effusion with associated atelectasis/consolidation  # MSSA bacteremia with progressing t4-t5 discitis and osteomyelitis on cefazolin ( t4 bone marrow biopsy consistent with osteomyelitis ) TTE and TEE negative for endocarditis. Blood cultures cleared on 9/30  # Chronic Ulcer left hallux: Evaluated by Podiatry last admission and did not recommend surgical intervention   # H/o COVID in July 2022  Recommendations  DC Vancomycin and cefepime and start cefazolin ( pharmacy consulted) Fu repeat blood cultures  Monitor CBC, BMP Monitor respiratory status - has pleuritic chest pain in the setting of pleural effusion  Follow Neurosurgical plans - would recommend to send tissue for gram stain/aerobic/anaerobic cx and pathology   Rest of the management as per the primary team. Please call with questions or concerns.  Thank you for the consult  Rosiland Oz, MD Infectious Disease Physician [facility] [address] [place], [place] [address] Phone: [phone]  Fax: [phone]  __________________________________________________________________________________________________________ HPI and Hospital Course: 45 year old male with PMH of DM with retinopathy/neuropathy, hypertension, hyperlipidemia, CKD, COVID-19 in July 2022 with recent hospital admission for MSSA bacteremia complicated with V5-6 vertebral lytic lesion and sternal lytic lesion with possible osteomyelitis who came into the ED on 10/13 with complaints of worsening back pain and right chest pain with worsening shortness of breath. Patient was recently admitted 9/28-10/10 where work up was remarkable for MSSA bacteremia, T5-T6 vertebral lytic lesion and sternal lytic lesion concerning for Osteomyelitis. Initially there was concern for MM and malignancy and patient was evaluated by Oncology with negative work up. Patient had a  T5 bone biopsy on 10/4 with findings s/o Osteomyelitis. TTE and TEE negative for endocarditis. Seen by ID and planned for 6 weeks of IV cefazolin.  At ED, he is afebrile, WBC 13.1, LA 0.6 Imagings as below Neurosx has been consulted, plans to go to OR possibly later today   Denies any pain/tenderness at the PICC line site. Last BM on Saturday and urinating OK. Denies any loss of sensation in the buttocks.   ROS: General- Denies fever, chills, loss of appetite and loss of weight HEENT - Denies headache, blurry vision, neck pain, sinus pain Chest - Denies any chest pain, minimal cough and pleuritic rt  chest pain  CVS-  Denies any dizziness/lightheadedness, syncopal attacks, palpitations Abdomen- Denies any nausea, vomiting, abdominal pain, hematochezia and diarrhea Neuro - Denies any weakness, numbness, tingling sensation Psych - Denies any changes in mood irritability or depressive symptoms GU- Denies any burning, dysuria, hematuria or increased frequency of urination Skin - denies any rashes/lesions MSK - severe back  pain   Past Medical History:  Diagnosis Date   GAD (generalized anxiety disorder)    Hyperlipidemia    Macular degeneration, bilateral    Retinopathy    Type II diabetes mellitus with complication, uncontrolled    retinopathy, neuropathy, microalbuminuria   Past Surgical History:  Procedure Laterality Date   APPENDECTOMY     BUBBLE STUDY  07/29/2021   Procedure: BUBBLE STUDY;  Surgeon: Jerline Pain, MD;  Location: [facility];  Service: Cardiovascular;;   HERNIA REPAIR     IR FLUORO GUIDED NEEDLE PLC ASPIRATION/INJECTION LOC  07/28/2021   TEE WITHOUT CARDIOVERSION N/A 07/29/2021   Procedure: TRANSESOPHAGEAL ECHOCARDIOGRAM (TEE);  Surgeon: Jerline Pain, MD;  Location: [facility];  Service: Cardiovascular;  Laterality: N/A;   TRIGGER FINGER RELEASE Right 10/25/2019   Procedure: RIGHT INDEX FINGER RELEASE TRIGGER FINGER/A-1 PULLEY;  Surgeon: Daryll Brod, MD;  Location: [facility];  Service: Orthopedics;  Laterality: Right;  IV REGIONAL FOREARM BLOCK    Scheduled Meds: Continuous Infusions:  ceFEPime (MAXIPIME) IV     vancomycin     PRN Meds:.  Allergies  Allergen Reactions   Cranberry Itching   Social History   Socioeconomic History   Marital status: Significant Other    Spouse name: Not on file   Number of children: 2   Years of education: Not on file   Highest education level: Bachelor's degree (e.g., BA, AB, BS)  Occupational History   Occupation: Multimedia programmer: [employer]  Tobacco Use   Smoking status: Former    Packs/day: 1.00    Years: 20.00    Pack years: 20.00    Types: Cigarettes    Quit date: 07/2020    Years since quitting: 1.0   Smokeless tobacco: Never  Vaping Use   Vaping Use: Never used  Substance and Sexual Activity   Alcohol use: Not Currently   Drug use: Never   Sexual activity: Yes    Partners: Female    Birth control/protection: None    Comment: with monogamous partner  Other Topics Concern    Not on file  Social History Narrative   Pt has lived 32 of life in [place]. Lives at home with partner, 2 kids, 8 cats, and 1 dog.    Social Determinants of Health   Financial Resource Strain: Not on file  Food Insecurity: Not on file  Transportation Needs: Not on file  Physical Activity: Not on file  Stress: Not on file  Social Connections: Not on file  Intimate Partner Violence: Not on file   Family History  Problem Relation Age of Onset   Diabetes Mother    Hyperlipidemia Mother    Stroke Mother    Diabetes Father    Hyperlipidemia Brother    Stroke Brother    ADD / ADHD Brother    ADD / ADHD Son   '    Vitals BP (!) 181/95   Pulse (!) 112   Temp 98.2 F (36.8 C) (Oral)   Resp 16   Ht 5' 11"  (1.803 m)   Wt 86.2 kg   SpO2 93%   BMI 26.50 kg/m  Physical Exam Constitutional:  Lying in bed, not in acute distress    Comments: mucosa moist   Cardiovascular:     Rate and Rhythm: Normal rate and regular rhythm.     Heart sounds: tachycardic   Pulmonary:     Effort: Pulmonary effort is normal.     Comments: decreased air entry in the bases and bilateral basilar crackles   Abdominal:     Palpations: Abdomen is soft.     Tenderness: Non tender and non distended   Musculoskeletal:        General: No swelling or tenderness. Back unable to examine due to excessive pain with movement   Skin:    Comments: No obvious lesions or rashes   Neurological:     General: Power 4/5 in bilateral lower extremities, limited due to pain   Psychiatric:        Mood and Affect: Mood normal. Calm and cooperative    Pertinent Microbiology Results for orders placed or performed during the hospital encounter of 08/06/21  Resp Panel by RT-PCR (Flu A&B, Covid) Nasopharyngeal Swab     Status: Abnormal   Collection Time: 08/06/21  9:36 AM   Specimen: Nasopharyngeal Swab; Nasopharyngeal(NP) swabs in vial transport medium  Result Value Ref Range Status   SARS  Coronavirus 2 by RT PCR POSITIVE (A) NEGATIVE Final    Comment: rn sarah b. [id number] fcp (NOTE) SARS-CoV-2 target nucleic acids are DETECTED.  The SARS-CoV-2 RNA is generally detectable in upper respiratory specimens during the acute phase of infection. Positive results are indicative of the presence of the identified virus, but do not rule out bacterial infection or co-infection with other pathogens not detected by the test. Clinical correlation with patient history and other diagnostic information is necessary to determine patient infection status. The expected result is Negative.  Fact Sheet for Patients: EntrepreneurPulse.com.au  Fact Sheet for Healthcare Providers: IncredibleEmployment.be  This test is not yet approved or cleared by the [place] FDA and  has been authorized for detection and/or diagnosis of SARS-CoV-2 by FDA under an Emergency Use Authorization (EUA).  This EUA will remain in effect (meaning this test can be used) for the duration of  the COVID-19 declaration und er Section 564(b)(1) of the Act, 21 U.S.C. section 360bbb-3(b)(1), unless the authorization is terminated or revoked sooner.     Influenza A by PCR NEGATIVE NEGATIVE Final   Influenza B by PCR NEGATIVE NEGATIVE Final    Comment: (NOTE) The Xpert Xpress SARS-CoV-2/FLU/RSV plus assay is intended as an aid in the diagnosis of influenza from Nasopharyngeal swab specimens and should not be used as a sole basis for treatment. Nasal washings and aspirates are unacceptable for Xpert Xpress SARS-CoV-2/FLU/RSV testing.  Fact Sheet for Patients: EntrepreneurPulse.com.au  Fact Sheet for Healthcare Providers: IncredibleEmployment.be  This test is not yet approved or cleared by the [place] FDA and has been authorized for detection and/or diagnosis of SARS-CoV-2 by FDA under an Emergency Use Authorization (EUA). This EUA  will remain in effect (meaning this test can be used) for the duration of the COVID-19 declaration under Section 564(b)(1) of the Act, 21 U.S.C. section 360bbb-3(b)(1), unless the authorization is terminated or revoked.  Performed at [facility], [place] [address] [place], [place] [address]       Pertinent Lab seen by me: CBC Latest Ref Rng & Units 08/06/2021 08/02/2021 08/01/2021  WBC 4.0 - 10.5 K/uL 13.1(H) 16.9(H) 19.5(H)  Hemoglobin 13.0 - 17.0 g/dL 8.4(L) 7.8(L)  8.6(L)  Hematocrit 39.0 - 52.0 % 26.1(L) 24.1(L) 26.3(L)  Platelets 150 - 400 K/uL 518(H) 385 395   CMP Latest Ref Rng & Units 08/06/2021 08/03/2021 08/02/2021  Glucose 70 - 99 mg/dL 167(H) 212(H) 241(H)  BUN 6 - 20 mg/dL 21(H) 40(H) 39(H)  Creatinine 0.61 - 1.24 mg/dL 1.50(H) 2.20(H) 2.32(H)  Sodium 135 - 145 mmol/L 134(L) 134(L) 135  Potassium 3.5 - 5.1 mmol/L 4.6 4.8 4.9  Chloride 98 - 111 mmol/L 101 103 101  CO2 22 - 32 mmol/L 20(L) 25 25  Calcium 8.9 - 10.3 mg/dL 9.4 9.2 9.2  Total Protein 6.5 - 8.1 g/dL 7.0 - -  Total Bilirubin 0.3 - 1.2 mg/dL 0.5 - -  Alkaline Phos 38 - 126 U/L 163(H) - -  AST 15 - 41 U/L 17 - -  ALT 0 - 44 U/L <5 - -     Pertinent Imagings/Other Imagings Plain films and CT images have been personally visualized and interpreted; radiology reports have been reviewed. Decision making incorporated into the Impression / Recommendations.  CTAPE 08/06/21 IMPRESSION: 1. Negative examination for pulmonary embolism. 2. Moderate bilateral pleural effusions and associated atelectasis or consolidation, new compared to prior examination. 3. There is substantial interval worsening of destructive lytic change about the T4-T5 disc space. Overlying paravertebral soft tissue thickening. Findings are consistent with worsened discitis osteomyelitis but in general better assessed by same-day MR. 4. Unchanged enlarged right hilar and subcarinal lymph nodes, likely reactive. 5. Cardiomegaly and  coronary artery disease.   CT abdomen pelvis 08/06/21 IMPRESSION: 1. Generally large burden of stool and stool balls throughout the colon. 2. Distended urinary bladder, measuring up to 19.0 cm. Correlate for urinary retention. 3. Anasarca.   MRI T spine 08/06/21 IMPRESSION: 1. Redemonstrated abnormal bone marrow signal and enhancement involving the T4 and T5 levels with increased surrounding paraspinal phlegmon and new epidural phlegmon (6 mm thick ventrally). Interval T5 pathologic fracture (50-60% height loss) with new 4 mm of bony retropulsion. Constellation of findings results in new moderate to severe left eccentric canal stenosis at T4 with cord deformity. 2. Overall, findings are most likely secondary to progressive discitis/osteomyelitis. Superimposed osseous metastatic disease or multiple myeloma is not excluded by imaging. Recommend correlation with the results of reported recent biopsy. 3. Multiple small (subcentimeter) fluid collections in the left posterior paraspinal phlegmon could represent tiny abscesses.  I spent more than 70  minutes for this patient encounter including review of prior medical records/discussing diagnostics and treatment plan with the patient/family/coordinate care with primary/other specialits with greater than 50% of time in face to face encounter.   Electronically signed by:   Rosiland Oz, MD Infectious Disease Physician [facility] for Infectious Disease Pager: [phone]

## 2021-08-06 NOTE — Progress Notes (Signed)
Pharmacy Antibiotic Note  Jose [name] is a 45 y.o. male admitted on 08/06/2021 with  osteomyelitis .  Pharmacy has been consulted for vancomycin and cefepime dosing.  Patient discharged last Saturday after being admitted for osteomyelitis and pneumonia. Returns to ED today with worsening back and shoulder pain and chills. PTA Ancef via PICC line.   Afebrile, tachycardic (HR 114), WBC 13.1, and SCr 1.50 (~ at BL).   Plan: Vancomycin 1750 mg IV once as loading dose Followed by vancomycin 1750 mg q24 hours      eAUC 484.3, Cmin 10.4 Cefepime 2 g IV q8h Monitor labs, drug levels, cultures, and clinical improvement  Height: '5\' 11"'$  (180.3 cm) Weight: 86.2 kg (190 lb) IBW/kg (Calculated) : 75.3  Temp (24hrs), Avg:98.2 F (36.8 C), Min:98.2 F (36.8 C), Max:98.2 F (36.8 C)  Recent Labs  Lab 07/31/21 0531 08/01/21 1049 08/02/21 1421 08/03/21 0541 08/06/21 0937  WBC 13.3* 19.5* 16.9*  --  13.1*  CREATININE 1.73* 2.08* 2.32* 2.20* 1.50*  LATICACIDVEN  --   --   --   --  0.6    Estimated Creatinine Clearance: 64.8 mL/min (A) (by C-G formula based on SCr of 1.5 mg/dL (H)).    Allergies  Allergen Reactions   Cranberry Itching    Antimicrobials this admission: Cefepime 10/13>> Vancomycin 10/13>>   Microbiology results: 10/13 BCx: in process 10/13 UCx: in process    Thank you for allowing pharmacy to be a part of this patient's care.  Donald Pore 08/06/2021 1:25 PM

## 2021-08-07 ENCOUNTER — Inpatient Hospital Stay (HOSPITAL_COMMUNITY): Payer: 59 | Admitting: Anesthesiology

## 2021-08-07 ENCOUNTER — Inpatient Hospital Stay (HOSPITAL_COMMUNITY): Payer: 59

## 2021-08-07 ENCOUNTER — Encounter (HOSPITAL_COMMUNITY)
Admission: EM | Disposition: A | Discharge status: Rehabilitation Facility | Payer: Self-pay | Source: Home / Self Care | Attending: Family Medicine

## 2021-08-07 ENCOUNTER — Encounter (HOSPITAL_COMMUNITY): Payer: Self-pay | Admitting: Family Medicine

## 2021-08-07 ENCOUNTER — Ambulatory Visit: Payer: 59 | Admitting: Podiatry

## 2021-08-07 DIAGNOSIS — M4624 Osteomyelitis of vertebra, thoracic region: Secondary | ICD-10-CM | POA: Diagnosis not present

## 2021-08-07 HISTORY — PX: LUMBAR LAMINECTOMY/DECOMPRESSION MICRODISCECTOMY: SHX5026

## 2021-08-07 LAB — URINE CULTURE: Culture: NO GROWTH

## 2021-08-07 LAB — COMPREHENSIVE METABOLIC PANEL
ALT: <5 U/L (ref 0–44)
AST: 13 U/L — ABNORMAL LOW (ref 15–41)
Albumin: 1.7 g/dL — ABNORMAL LOW (ref 3.5–5.0)
Alkaline Phosphatase: 145 U/L — ABNORMAL HIGH (ref 38–126)
Anion gap: 7 (ref 5–15)
BUN: 19 mg/dL (ref 6–20)
CO2: 24 mmol/L (ref 22–32)
Calcium: 9 mg/dL (ref 8.9–10.3)
Chloride: 102 mmol/L (ref 98–111)
Creatinine, Ser: 1.47 mg/dL — ABNORMAL HIGH (ref 0.61–1.24)
GFR, Estimated: 59 mL/min — ABNORMAL LOW (ref >60)
Glucose, Bld: 275 mg/dL — ABNORMAL HIGH (ref 70–99)
Potassium: 4.2 mmol/L (ref 3.5–5.1)
Sodium: 133 mmol/L — ABNORMAL LOW (ref 135–145)
Total Bilirubin: 0.6 mg/dL (ref 0.3–1.2)
Total Protein: 6.1 g/dL — ABNORMAL LOW (ref 6.5–8.1)

## 2021-08-07 LAB — POCT I-STAT EG7
Acid-Base Excess: 0 mmol/L (ref 0.0–2.0)
Bicarbonate: 26.6 mmol/L (ref 20.0–28.0)
Calcium, Ion: 1.32 mmol/L (ref 1.15–1.40)
HCT: 23 % — ABNORMAL LOW (ref 39.0–52.0)
Hemoglobin: 7.8 g/dL — ABNORMAL LOW (ref 13.0–17.0)
O2 Saturation: 98 %
Potassium: 4.8 mmol/L (ref 3.5–5.1)
Sodium: 137 mmol/L (ref 135–145)
TCO2: 28 mmol/L (ref 22–32)
pCO2, Ven: 54.2 mmHg (ref 44.0–60.0)
pH, Ven: 7.298 (ref 7.250–7.430)
pO2, Ven: 116 mmHg — ABNORMAL HIGH (ref 32.0–45.0)

## 2021-08-07 LAB — CBC
HCT: 23.9 % — ABNORMAL LOW (ref 39.0–52.0)
Hemoglobin: 7.8 g/dL — ABNORMAL LOW (ref 13.0–17.0)
MCH: 27.5 pg (ref 26.0–34.0)
MCHC: 32.6 g/dL (ref 30.0–36.0)
MCV: 84.2 fL (ref 80.0–100.0)
Platelets: 500 10*3/uL — ABNORMAL HIGH (ref 150–400)
RBC: 2.84 MIL/uL — ABNORMAL LOW (ref 4.22–5.81)
RDW: 13.1 % (ref 11.5–15.5)
WBC: 10 10*3/uL (ref 4.0–10.5)
nRBC: 0 % (ref 0.0–0.2)

## 2021-08-07 LAB — GLUCOSE, CAPILLARY
Glucose-Capillary: 246 mg/dL — ABNORMAL HIGH (ref 70–99)
Glucose-Capillary: 148 mg/dL — ABNORMAL HIGH (ref 70–99)
Glucose-Capillary: 122 mg/dL — ABNORMAL HIGH (ref 70–99)
Glucose-Capillary: 157 mg/dL — ABNORMAL HIGH (ref 70–99)
Glucose-Capillary: 173 mg/dL — ABNORMAL HIGH (ref 70–99)

## 2021-08-07 LAB — PREPARE RBC (CROSSMATCH)

## 2021-08-07 IMAGING — RF DG THORACIC SPINE 2V
1 series · 1 of 1 positions shown · non-contrast
Comparison: CTA chest dated [DATE]

FLUOROSCOPY TIME:  19 seconds

9.8 mGy

CLINICAL DATA: T4-5 laminectomy/decompression with micro discectomy

EXAM:
THORACIC SPINE 2 VIEWS; DG C-ARM 1-60 MIN-NO REPORT

[Series 1: run · 1 of 1 slices shown]
[im 1/1]
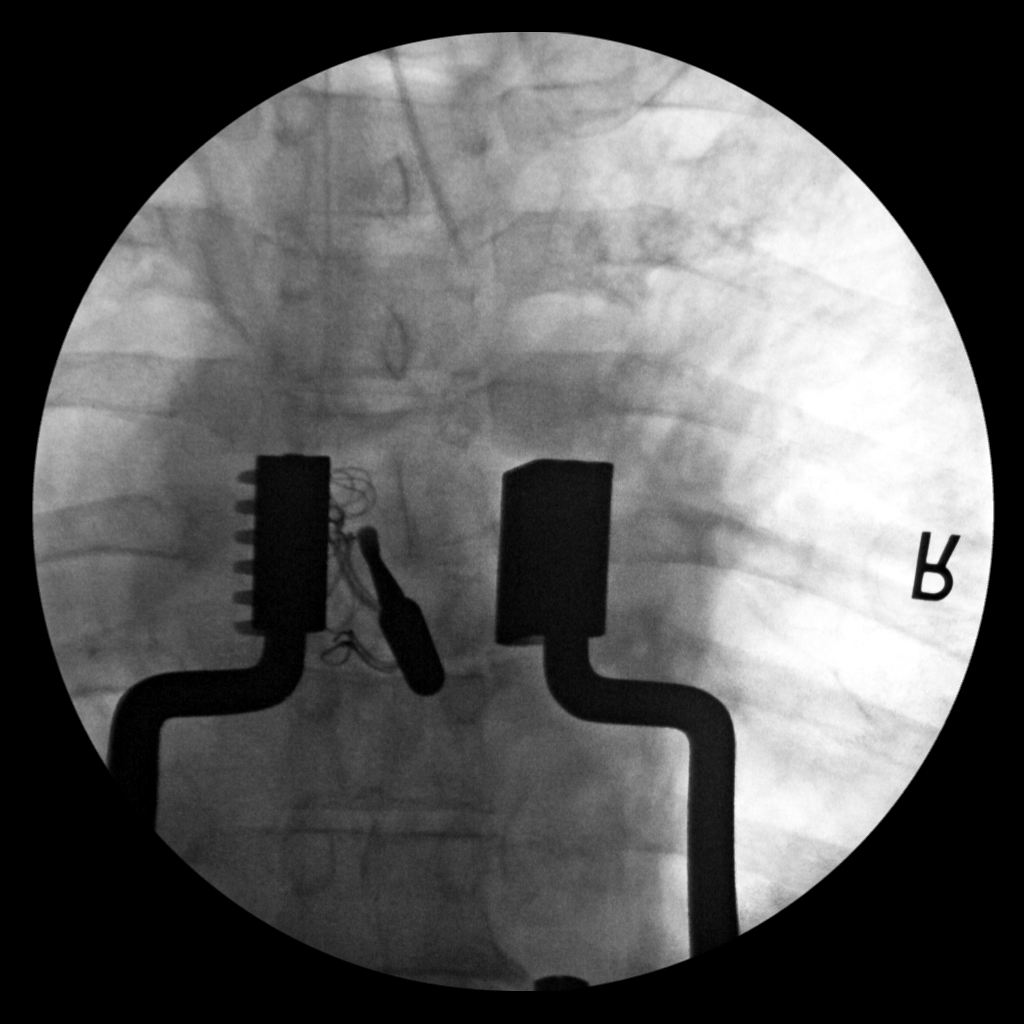

[1 of 1 positions shown; findings below may reference images not displayed]

FINDINGS: Intraoperative fluoroscopic spot radiograph of the thoracic spine
during T4-5 laminectomy/decompression.
IMPRESSION: Intraoperative fluoroscopic images during T4-5
laminectomy/decompression.

## 2021-08-07 SURGERY — LUMBAR LAMINECTOMY/DECOMPRESSION MICRODISCECTOMY 1 LEVEL
Anesthesia: General

## 2021-08-07 MED ORDER — PROMETHAZINE HCL 25 MG/ML IJ SOLN: 6.2500 mg | INTRAMUSCULAR | Status: DC | PRN

## 2021-08-07 MED ORDER — DEXAMETHASONE SODIUM PHOSPHATE 10 MG/ML IJ SOLN
INTRAMUSCULAR | Status: AC
  Filled 2021-08-07: qty 1

## 2021-08-07 MED ORDER — METHYLPREDNISOLONE ACETATE 80 MG/ML IJ SUSP
INTRAMUSCULAR | Status: AC
  Filled 2021-08-07: qty 1

## 2021-08-07 MED ORDER — THROMBIN 5000 UNITS EX SOLR
OROMUCOSAL | Status: DC | PRN
  Administered 2021-08-07: 5 mL via TOPICAL

## 2021-08-07 MED ORDER — HYDROMORPHONE HCL 1 MG/ML IJ SOLN
1.0000 mg | INTRAMUSCULAR | Status: DC | PRN
  Administered 2021-08-07 (×7): 1 mg via INTRAVENOUS
  Filled 2021-08-07 (×7): qty 1

## 2021-08-07 MED ORDER — SODIUM CHLORIDE 0.9 % IV SOLN
10.0000 mL/h | Freq: Once | INTRAVENOUS | Status: AC
  Administered 2021-08-07: 10 mL/h via INTRAVENOUS

## 2021-08-07 MED ORDER — LIDOCAINE 2% (20 MG/ML) 5 ML SYRINGE
INTRAMUSCULAR | Status: AC
  Filled 2021-08-07: qty 5

## 2021-08-07 MED ORDER — OXYCODONE HCL 5 MG/5ML PO SOLN: 5.0000 mg | Freq: Once | ORAL | Status: DC | PRN

## 2021-08-07 MED ORDER — ONDANSETRON HCL 4 MG/2ML IJ SOLN
INTRAMUSCULAR | Status: AC
  Filled 2021-08-07: qty 2

## 2021-08-07 MED ORDER — ROCURONIUM BROMIDE 10 MG/ML (PF) SYRINGE
PREFILLED_SYRINGE | INTRAVENOUS | Status: DC | PRN
  Administered 2021-08-07: 20 mg via INTRAVENOUS
  Administered 2021-08-07: 60 mg via INTRAVENOUS

## 2021-08-07 MED ORDER — PROPOFOL 10 MG/ML IV BOLUS
INTRAVENOUS | Status: AC
  Filled 2021-08-07: qty 20

## 2021-08-07 MED ORDER — LIDOCAINE-EPINEPHRINE 1 %-1:100000 IJ SOLN
INTRAMUSCULAR | Status: DC | PRN
  Administered 2021-08-07: 3 mL

## 2021-08-07 MED ORDER — BUPIVACAINE HCL (PF) 0.5 % IJ SOLN
INTRAMUSCULAR | Status: AC
  Filled 2021-08-07: qty 30

## 2021-08-07 MED ORDER — SUCCINYLCHOLINE CHLORIDE 200 MG/10ML IV SOSY
PREFILLED_SYRINGE | INTRAVENOUS | Status: DC | PRN
  Administered 2021-08-07: 120 mg via INTRAVENOUS

## 2021-08-07 MED ORDER — PROPOFOL 10 MG/ML IV BOLUS
INTRAVENOUS | Status: DC | PRN
  Administered 2021-08-07: 100 mg via INTRAVENOUS

## 2021-08-07 MED ORDER — SODIUM CHLORIDE 0.9 % IV SOLN
INTRAVENOUS | Status: DC | PRN
  Administered 2021-08-07: 25 ug/min via INTRAVENOUS

## 2021-08-07 MED ORDER — FENTANYL CITRATE (PF) 250 MCG/5ML IJ SOLN
INTRAMUSCULAR | Status: DC | PRN
  Administered 2021-08-07 (×5): 50 ug via INTRAVENOUS

## 2021-08-07 MED ORDER — LACTATED RINGERS IV SOLN: INTRAVENOUS | Status: DC | PRN

## 2021-08-07 MED ORDER — ONDANSETRON HCL 4 MG/2ML IJ SOLN
INTRAMUSCULAR | Status: DC | PRN
  Administered 2021-08-07: 4 mg via INTRAVENOUS

## 2021-08-07 MED ORDER — LIDOCAINE-EPINEPHRINE 1 %-1:100000 IJ SOLN
INTRAMUSCULAR | Status: AC
  Filled 2021-08-07: qty 1

## 2021-08-07 MED ORDER — BUPIVACAINE HCL (PF) 0.5 % IJ SOLN
INTRAMUSCULAR | Status: DC | PRN
  Administered 2021-08-07: 3 mL

## 2021-08-07 MED ORDER — LIDOCAINE 2% (20 MG/ML) 5 ML SYRINGE
INTRAMUSCULAR | Status: DC | PRN
  Administered 2021-08-07: 60 mg via INTRAVENOUS

## 2021-08-07 MED ORDER — FENTANYL CITRATE (PF) 250 MCG/5ML IJ SOLN
INTRAMUSCULAR | Status: AC
  Filled 2021-08-07: qty 5

## 2021-08-07 MED ORDER — THROMBIN 5000 UNITS EX SOLR
CUTANEOUS | Status: AC
  Filled 2021-08-07: qty 5000

## 2021-08-07 MED ORDER — MIDAZOLAM HCL 2 MG/2ML IJ SOLN
INTRAMUSCULAR | Status: AC
  Filled 2021-08-07: qty 2

## 2021-08-07 MED ORDER — 0.9 % SODIUM CHLORIDE (POUR BTL) OPTIME
TOPICAL | Status: DC | PRN
  Administered 2021-08-07: 1000 mL

## 2021-08-07 MED ORDER — ROCURONIUM BROMIDE 10 MG/ML (PF) SYRINGE
PREFILLED_SYRINGE | INTRAVENOUS | Status: AC
  Filled 2021-08-07: qty 10

## 2021-08-07 MED ORDER — OXYCODONE HCL 5 MG PO TABS
5.0000 mg | ORAL_TABLET | Freq: Once | ORAL | Status: DC | PRN
Start: 2021-08-07 — End: 2021-08-07

## 2021-08-07 MED ORDER — HYDROMORPHONE HCL 1 MG/ML IJ SOLN: 0.2500 mg | INTRAMUSCULAR | Status: DC | PRN

## 2021-08-07 MED ORDER — MIDAZOLAM HCL 2 MG/2ML IJ SOLN
INTRAMUSCULAR | Status: DC | PRN
  Administered 2021-08-07: 2 mg via INTRAVENOUS

## 2021-08-07 SURGICAL SUPPLY — 64 items
ADH SKN CLS APL DERMABOND .7 (GAUZE/BANDAGES/DRESSINGS) ×1
APL SKNCLS STERI-STRIP NONHPOA (GAUZE/BANDAGES/DRESSINGS)
BAG COUNTER SPONGE SURGICOUNT (BAG) ×3 IMPLANT
BAG SPNG CNTER NS LX DISP (BAG) ×2
BAND INSRT 18 STRL LF DISP RB (MISCELLANEOUS) ×2
BAND RUBBER #18 3X1/16 STRL (MISCELLANEOUS) ×4 IMPLANT
BENZOIN TINCTURE PRP APPL 2/3 (GAUZE/BANDAGES/DRESSINGS) IMPLANT
BLADE CLIPPER SURG (BLADE) IMPLANT
BLADE SURG 11 STRL SS (BLADE) ×2 IMPLANT
BUR MATCHSTICK NEURO 3.0 LAGG (BURR) ×1 IMPLANT
BUR PRECISION FLUTE 5.0 (BURR) ×1 IMPLANT
CANISTER SUCT 3000ML PPV (MISCELLANEOUS) ×2 IMPLANT
CARTRIDGE OIL MAESTRO DRILL (MISCELLANEOUS) ×1 IMPLANT
CNTNR URN SCR LID CUP LEK RST (MISCELLANEOUS) IMPLANT
CONT SPEC 4OZ STRL OR WHT (MISCELLANEOUS) ×2
DECANTER SPIKE VIAL GLASS SM (MISCELLANEOUS) ×2 IMPLANT
DERMABOND ADVANCED (GAUZE/BANDAGES/DRESSINGS) ×1
DERMABOND ADVANCED .7 DNX12 (GAUZE/BANDAGES/DRESSINGS) ×1 IMPLANT
DIFFUSER DRILL AIR PNEUMATIC (MISCELLANEOUS) ×2 IMPLANT
DRAPE C-ARM 42X72 X-RAY (DRAPES) ×2 IMPLANT
DRAPE LAPAROTOMY 100X72X124 (DRAPES) ×2 IMPLANT
DRAPE MICROSCOPE LEICA (MISCELLANEOUS) ×2 IMPLANT
DRAPE SURG 17X23 STRL (DRAPES) ×2 IMPLANT
DRSG OPSITE POSTOP 3X4 (GAUZE/BANDAGES/DRESSINGS) ×2 IMPLANT
DRSG OPSITE POSTOP 4X6 (GAUZE/BANDAGES/DRESSINGS) ×1 IMPLANT
DURAPREP 26ML APPLICATOR (WOUND CARE) ×2 IMPLANT
ELECT REM PT RETURN 9FT ADLT (ELECTROSURGICAL) ×2
ELECTRODE REM PT RTRN 9FT ADLT (ELECTROSURGICAL) ×1 IMPLANT
GAUZE 4X4 16PLY ~~LOC~~+RFID DBL (SPONGE) ×1 IMPLANT
GAUZE SPONGE 4X4 12PLY STRL (GAUZE/BANDAGES/DRESSINGS) IMPLANT
GLOVE EXAM NITRILE XL STR (GLOVE) IMPLANT
GLOVE SURG ENC MOIS LTX SZ7.5 (GLOVE) IMPLANT
GLOVE SURG LTX SZ7 (GLOVE) ×2 IMPLANT
GLOVE SURG UNDER POLY LF SZ7.5 (GLOVE) ×4 IMPLANT
GOWN STRL REUS W/ TWL LRG LVL3 (GOWN DISPOSABLE) ×2 IMPLANT
GOWN STRL REUS W/ TWL XL LVL3 (GOWN DISPOSABLE) IMPLANT
GOWN STRL REUS W/TWL 2XL LVL3 (GOWN DISPOSABLE) IMPLANT
GOWN STRL REUS W/TWL LRG LVL3 (GOWN DISPOSABLE) ×4
GOWN STRL REUS W/TWL XL LVL3 (GOWN DISPOSABLE)
HEMOSTAT POWDER KIT SURGIFOAM (HEMOSTASIS) ×2 IMPLANT
KIT BASIN OR (CUSTOM PROCEDURE TRAY) ×2 IMPLANT
KIT TURNOVER KIT B (KITS) ×2 IMPLANT
NDL HYPO 18GX1.5 BLUNT FILL (NEEDLE) IMPLANT
NDL SPNL 18GX3.5 QUINCKE PK (NEEDLE) IMPLANT
NEEDLE HYPO 18GX1.5 BLUNT FILL (NEEDLE) IMPLANT
NEEDLE HYPO 22GX1.5 SAFETY (NEEDLE) ×2 IMPLANT
NEEDLE SPNL 18GX3.5 QUINCKE PK (NEEDLE) ×6 IMPLANT
NS IRRIG 1000ML POUR BTL (IV SOLUTION) ×2 IMPLANT
OIL CARTRIDGE MAESTRO DRILL (MISCELLANEOUS) ×2
PACK LAMINECTOMY NEURO (CUSTOM PROCEDURE TRAY) ×2 IMPLANT
PAD ARMBOARD 7.5X6 YLW CONV (MISCELLANEOUS) ×6 IMPLANT
SPONGE SURGIFOAM ABS GEL SZ50 (HEMOSTASIS) ×2 IMPLANT
SPONGE T-LAP 4X18 ~~LOC~~+RFID (SPONGE) ×1 IMPLANT
STRIP CLOSURE SKIN 1/2X4 (GAUZE/BANDAGES/DRESSINGS) IMPLANT
SUT VIC AB 0 CT1 18XCR BRD8 (SUTURE) ×1 IMPLANT
SUT VIC AB 0 CT1 8-18 (SUTURE) ×4
SUT VIC AB 2-0 CT1 18 (SUTURE) IMPLANT
SUT VICRYL 3-0 RB1 18 ABS (SUTURE) ×4 IMPLANT
SWAB COLLECTION DEVICE MRSA (MISCELLANEOUS) ×1 IMPLANT
SWAB CULTURE ESWAB REG 1ML (MISCELLANEOUS) ×1 IMPLANT
SYR 3ML LL SCALE MARK (SYRINGE) IMPLANT
TOWEL GREEN STERILE (TOWEL DISPOSABLE) ×2 IMPLANT
TOWEL GREEN STERILE FF (TOWEL DISPOSABLE) ×2 IMPLANT
WATER STERILE IRR 1000ML POUR (IV SOLUTION) ×2 IMPLANT

## 2021-08-07 NOTE — TOC Initial Note (Signed)
Transition of Care [facility]) - Initial/Assessment Note    Patient Details  Name: Jose [name] MRN: [id number] Date of Birth: [birth year]  [facility]) CM/SW Contact:    Tom-Johnson, Renea Ee, RN Phone Number: 08/07/2021, 1:45 PM  Clinical Narrative:                 CM spoke with patient at bedside. Recently hospitalized for MSSA bacteremia and osteomyelitis of thoracic spine at T5-T6. Discharged home 08/03/21 with antibiotics to last until September 04, 2021. Patient reports since his discharge he has continued to have significant amount of pain and he is unable to walk or sit up. Patient is started on IV antibiotics, infectious disease and neurosurgery consulted.  States he is from home with wife and daughter. States he has four children. Both parent are living and are supportive with care. 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 Independent with care prior to hospitalization. Has a Cane, walker and bedside commode from previous hospitalization. Has a PCP and Medically Insured by [place] healthcare. Laminectomy and debridement of the abscess scheduled today 08/07/21. Medical workup continues. CM will continue to follow with needs.    Barriers to Discharge: Continued Medical Work up   Patient Goals and CMS Choice Patient states their goals for this hospitalization and ongoing recovery are:: To get rid of the pain and go home. CMS Medicare.gov Compare Post Acute Care list provided to:: Patient    Expected Discharge Plan and Services     Discharge Planning Services: CM Consult   Living arrangements for the past 2 months: Single Family Home                                      Prior Living Arrangements/Services Living arrangements for the past 2 months: Single Family Home Lives with:: Spouse, Minor Children (One child.) Patient language and need for interpreter reviewed:: Yes Do you feel safe going back to the place where you live?: Yes       Need for Family Participation in Patient Care: Yes (Comment) Care giver support system in place?: Yes (comment) Current home services: DME (Cane, walker,bedside commode.) Criminal Activity/Legal Involvement Pertinent to Current Situation/Hospitalization: No - Comment as needed  Activities of Daily Living Home Assistive Devices/Equipment: Cane (specify quad or straight) ADL Screening (condition at time of admission) Patient's cognitive ability adequate to safely complete daily activities?: Yes Is the patient deaf or have difficulty hearing?: No Does the patient have difficulty seeing, even when wearing glasses/contacts?: No Does the patient have difficulty concentrating, remembering, or making decisions?: No Patient able to express need for assistance with ADLs?: Yes Does the patient have difficulty dressing or bathing?: Yes Independently performs ADLs?: No Communication: Independent Dressing (OT): Needs assistance Is this a change from baseline?: Change from baseline, expected to last >3 days Grooming: Needs assistance Is this a change from baseline?: Change from baseline, expected to last >3 days Feeding: Independent Bathing: Needs assistance Is this a change from baseline?: Change from baseline, expected to last >3 days Toileting: Needs assistance Is this a change from baseline?: Change from baseline, expected to last >3days In/Out Bed: Needs assistance Is this a change from baseline?: Change from baseline, expected to last >3 days Walks in Home: Independent with device (comment) Does the patient have difficulty walking or climbing stairs?: Yes Weakness of Legs: Both Weakness of Arms/Hands: Both  Permission Sought/Granted Permission sought to  share information with : Case Manager, Family Supports Permission granted to share information with : Yes, Verbal Permission Granted              Emotional Assessment Appearance:: Appears stated age Attitude/Demeanor/Rapport:  Engaged Affect (typically observed): Accepting, Appropriate, Hopeful Orientation: : Oriented to Self, Oriented to Place, Oriented to  Time, Oriented to Situation Alcohol / Substance Use: Not Applicable Psych Involvement: No (comment)  Admission diagnosis:  Osteomyelitis of thoracic spine ([place]) [M46.24] Osteomyelitis of other site, unspecified type [facility]) [M86.9] Patient Active Problem List   Diagnosis Date Noted   Osteomyelitis of thoracic spine ([place]) 08/06/2021   Essential hypertension 08/06/2021   CKD (chronic kidney disease) stage 3, GFR 30-59 ml/min (HCC) 08/06/2021   Pleural effusion, bilateral 08/06/2021   Diabetic ulcer of left great toe ([place])    MSSA bacteremia 07/23/2021   Constipation 07/23/2021   Lytic bone lesions on xray 07/22/2021   Anemia 05/19/2021   Nausea and vomiting 05/19/2021   Elevated blood pressure reading 05/19/2021   Bilateral carpal tunnel syndrome 01/23/2020   Numbness 12/12/2019   Microalbuminuria due to type 2 diabetes mellitus ([place]) 07/20/2019   GAD (generalized anxiety disorder) 06/18/2019   Adult ADHD 06/18/2019   Trigger middle finger of left hand 06/11/2019   Diabetic retinopathy ([place]) 07/13/2018   Type 2 diabetes mellitus with diabetic polyneuropathy, without long-term current use of insulin ([place]) 05/17/2018   Tobacco use disorder 05/17/2018   Hyperlipidemia 05/17/2018   PCP:  [name], NP Pharmacy:   [facility] [place], [place] [facility] [place] [address] Phone: [phone] Fax: [phone]     Social Determinants of Health (SDOH) Interventions    Readmission Risk Interventions Readmission Risk Prevention Plan 07/30/2021  Transportation Screening Complete  PCP or Specialist Appt within 3-5 Days Complete  HRI or Home Care Consult Complete  Social Work Consult for [place] Planning/Counseling Complete  Palliative Care Screening Complete   Medication Review Press photographer) Complete  Some recent data might be hidden

## 2021-08-07 NOTE — Progress Notes (Signed)
  NEUROSURGERY PROGRESS NOTE   No issues overnight. Cont to c/o back pain. No new N/T/W  EXAM:  BP (!) 160/86 (BP Location: Right Arm)   Pulse (!) 103   Temp 98.7 F (37.1 C) (Oral)   Resp 18   Ht '5\' 11"'$  (1.803 m)   Wt 89.6 kg   SpO2 93%   BMI 27.55 kg/m   Awake, alert, oriented  Speech fluent, appropriate  CN grossly intact  MAE well  IMPRESSION:  45 y.o. male with T4-5 osteodiscitis with worsening T5 fracture and associated stenosis/compression. Remains neurologically intact.  PLAN: - Will proceed with T4-5 laminectomy, likely transpedicular decompression/debridement of abscess  I have again reviewed the situation with the patient. All his questions were answered and he provided consent to proceed.   Consuella Lose, MD [facility]

## 2021-08-07 NOTE — Progress Notes (Signed)
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  Patient was discharged home with antibiotics until September 04, 2021.  Patient reports since his discharge he continued to have significant amount of pain and he is unable to walk or sit up.  He described pain as piercing not controlled with oral pain medications.  Patient has been taking his stool softeners and lactulose without any success. CTA chest negative for PE, moderate bilateral pleural effusions and associated atelectasis or consolidation.  CT abdomen and pelvis shows large stool burden, anasarca.  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 Multiple small fluid collections in the left posterior paraspinal phlegmon could represent tiny abscesses. Patient is started on IV antibiotics, infectious disease and neurosurgery consulted.  Neurosurgery evaluation completed recommended laminectomy and debridement of the abscess scheduled today.   Assessment & Plan:   Principal Problem:   Osteomyelitis of thoracic spine (HCC) Active Problems:   Type 2 diabetes mellitus with diabetic polyneuropathy, without long-term current use of insulin (HCC)   Hyperlipidemia   GAD (generalized anxiety disorder)   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)   Essential hypertension   CKD (chronic kidney disease) stage 3, GFR 30-59 ml/min (HCC)   Pleural effusion, bilateral  MSSA bacteremia with osteomyelitis of thoracic spine: Patient diagnosed with MSSA bacteremia and known osteomyelitis of his thoracic spine (biopsy confirmed) He presented  with worsening pain, found to have worsening of his osteomyelitis, T5 pathological fracture, new moderate to severe left eccentric canal stenosis at T5 with cord deformity. Neurosurgery consulted, scheduled for laminectomy and abscess debridement today. Continue pain control with oxycodone and IV Dilaudid. Continue vancomycin and cefepime. Infectious disease consulted recommended Ancef for MSSA. Follow blood cultures TEE performed on 07/29/2021 at previous hospitalization.  Bilateral pleural effusions: CT chest negative for PE but shows bilateral pleural effusions with associated atelectasis or consolidation. Patient denies any cough, shortness of breath or fever, chills. No need for thoracocentesis at this time.  Constipation: Patient does not have a bowel movement since being on pain medication after discharge Continue lactulose, senna, escalate as needed  Type 2 diabetes with neuropathy: Continue regular insulin sliding scale. Last hemoglobin A1c 7.5  Essential hypertension: Blood pressure not controlled because of pain. Resume home blood pressure medications  CKD stage IIIb: Serum creatinine appears stable and improved from last hospitalization Avoid nephrotoxic medications, continue to monitor serum creatinine  Hyperlipidemia: Continue statins  Generalized anxiety disorder: Continue Xanax  Diabetic ulcer of the left great toe: Seems stable, no signs of infection. Patient was seen by podiatry at last hospitalization Recommended daily wound care with mupirocin and dry sterile dressing Wound care consult  Pilonidal cyst: Unable to examine because of back pain  COVID-19: Patient had positive test in July,  more than 8 weeks ago, no longer needs airborne precautions.  Normochromic normocytic anemia. Likely anemia of chronic disease.   DVT prophylaxis: SCDs Code Status: Full code. Family Communication: No family at bed side. Disposition Plan:   Status is:  Inpatient  Remains inpatient appropriate because:  requiring neurosurgery and debridement of abscess. Anticipated discharge: Home  Consultants:  Neurosurgery  Infectious diseases  Procedures: Scheduled laminectomy, debridement of abscess Antimicrobials:   Anti-infectives (From admission, onward)    Start     Dose/Rate Route Frequency Ordered Stop   08/06/21 2200  ceFAZolin (ANCEF) IVPB 2g/100 mL premix        2 g 200 mL/hr over 30 Minutes Intravenous Every 8 hours 08/06/21 1829     08/06/21 1415  vancomycin (VANCOREADY) IVPB 1750 mg/350 mL  Status:  Discontinued        1,750 mg 175 mL/hr over 120 Minutes Intravenous Every 24 hours 08/06/21 1400 08/06/21 1829   08/06/21 1415  ceFEPIme (MAXIPIME) 2 g in sodium chloride 0.9 % 100 mL IVPB  Status:  Discontinued        2 g 200 mL/hr over 30 Minutes Intravenous Every 8 hours 08/06/21 1400 08/06/21 1829       Subjective: Patient was seen and examined at bedside.  Overnight events noted.   Patient reports having significant amount of pain in his back. Patient reports partial improvement in pain after he is getting Dilaudid.   Patient is anticipating back surgery today.  Objective: Vitals:   08/06/21 2145 08/07/21 0200 08/07/21 0628 08/07/21 1059  BP: (!) 184/89 (!) 177/86 139/81 (!) 160/86  Pulse: (!) 114 (!) 105 (!) 102 (!) 103  Resp: _0 Temp: 98.4 F (36.9 C) 98.8 F (37.1 C) 98.6 F (37 C) 98.7 F (37.1 C)  TempSrc: Oral Oral Oral Oral  SpO2: 94% 91% 93% 93%  Weight:   89.6 kg   Height:        Intake/Output Summary (Last 24 hours) at 08/07/2021 1204 Last data filed at 08/07/2021 0600 Gross per 24 hour  Intake 895 ml  Output 1300 ml  Net -405 ml   Filed Weights   08/06/21 0847 08/07/21 0628  Weight: 86.2 kg 89.6 kg    Examination:  General exam: Appears not comfortable because of pain. Flat on his back. Respiratory system: Clear to auscultation bilaterally, RR 15 Cardiovascular system: S1-S2 heard,  regular rate and rhythm, no murmur. Gastrointestinal system: Abdomen is soft, nontender, nondistended, BS +. Central nervous system: Alert and oriented x 3. No focal neurological deficits. Extremities: No edema, partial erythematous wound in the left great big toe. Skin: No rashes, lesions or ulcers Psychiatry: Judgement and insight appear normal. Mood & affect appropriate.     Data Reviewed: I have personally reviewed following labs and imaging studies  CBC: Recent Labs  Lab 08/01/21 1049 08/02/21 1421 08/06/21 0937 08/07/21 0447  WBC 19.5* 16.9* 13.1* 10.0  NEUTROABS  --   --  10.0*  --   HGB 8.6* 7.8* 8.4* 7.8*  HCT 26.3* 24.1* 26.1* 23.9*  MCV 85.1 86.4 85.9 84.2  PLT 395 385 518* 591*   Basic Metabolic Panel: Recent Labs  Lab 08/01/21 1049 08/02/21 1421 08/03/21 0541 08/06/21 0937 08/07/21 0447  NA 128* 135 134* 134* 133*  K 4.3 4.9 4.8 4.6 4.2  CL 95* 101 103 101 102  CO2 _1 20* 24  GLUCOSE 218* 241* 212* 167* 275*  BUN 35* 39* 40* 21* 19  CREATININE 2.08* 2.32* 2.20* 1.50* 1.47*  CALCIUM 9.0 9.2 9.2 9.4 9.0   GFR: Estimated Creatinine Clearance: 66.2 mL/min (A) (by C-G formula based on SCr of 1.47 mg/dL (H)). Liver Function Tests: Recent Labs  Lab 08/06/21 0937 08/07/21 0447  AST 17 13*  ALT <5 <5  ALKPHOS 163* 145*  BILITOT 0.5 0.6  PROT  7.0 6.1*  ALBUMIN 2.0* 1.7*   Recent Labs  Lab 08/06/21 0937  LIPASE 21   No results for input(s): AMMONIA in the last 168 hours. Coagulation Profile: No results for input(s): INR, PROTIME in the last 168 hours. Cardiac Enzymes: No results for input(s): CKTOTAL, CKMB, CKMBINDEX, TROPONINI in the last 168 hours. BNP (last 3 results) No results for input(s): PROBNP in the last 8760 hours. HbA1C: No results for input(s): HGBA1C in the last 72 hours. CBG: Recent Labs  Lab 08/06/21 1623 08/06/21 1726 08/06/21 2053 08/07/21 0622 08/07/21 1137  GLUCAP 138* 145* 160* 246* 148*   Lipid Profile: No  results for input(s): CHOL, HDL, LDLCALC, TRIG, CHOLHDL, LDLDIRECT in the last 72 hours. Thyroid Function Tests: No results for input(s): TSH, T4TOTAL, FREET4, T3FREE, THYROIDAB in the last 72 hours. Anemia Panel: No results for input(s): VITAMINB12, FOLATE, FERRITIN, TIBC, IRON, RETICCTPCT in the last 72 hours. Sepsis Labs: Recent Labs  Lab 08/06/21 0814 08/06/21 1828  LATICACIDVEN 0.6 0.5    Recent Results (from the past 240 hour(s))  Culture, blood (routine x 2)     Status: None (Preliminary result)   Collection Time: 08/06/21  9:10 AM   Specimen: BLOOD  Result Value Ref Range Status   Specimen Description BLOOD SITE NOT SPECIFIED  Final   Special Requests   Final    BOTTLES DRAWN AEROBIC AND ANAEROBIC Blood Culture results may not be optimal due to an inadequate volume of blood received in culture bottles   Culture   Final    NO GROWTH < 24 HOURS Performed at [facility], [place] [address] [place], [place] [address]    Report Status PENDING  Incomplete  Resp Panel by RT-PCR (Flu A&B, Covid) Nasopharyngeal Swab     Status: Abnormal   Collection Time: 08/06/21  9:36 AM   Specimen: Nasopharyngeal Swab; Nasopharyngeal(NP) swabs in vial transport medium  Result Value Ref Range Status   SARS Coronavirus 2 by RT PCR POSITIVE (A) NEGATIVE Final    Comment: rn sarah b. [id number] fcp (NOTE) SARS-CoV-2 target nucleic acids are DETECTED.  The SARS-CoV-2 RNA is generally detectable in upper respiratory specimens during the acute phase of infection. Positive results are indicative of the presence of the identified virus, but do not rule out bacterial infection or co-infection with other pathogens not detected by the test. Clinical correlation with patient history and other diagnostic information is necessary to determine patient infection status. The expected result is Negative.  Fact Sheet for Patients: EntrepreneurPulse.com.au  Fact Sheet for Healthcare  Providers: IncredibleEmployment.be  This test is not yet approved or cleared by the [place] FDA and  has been authorized for detection and/or diagnosis of SARS-CoV-2 by FDA under an Emergency Use Authorization (EUA).  This EUA will remain in effect (meaning this test can be used) for the duration of  the COVID-19 declaration und er Section 564(b)(1) of the Act, 21 U.S.C. section 360bbb-3(b)(1), unless the authorization is terminated or revoked sooner.     Influenza A by PCR NEGATIVE NEGATIVE Final   Influenza B by PCR NEGATIVE NEGATIVE Final    Comment: (NOTE) The Xpert Xpress SARS-CoV-2/FLU/RSV plus assay is intended as an aid in the diagnosis of influenza from Nasopharyngeal swab specimens and should not be used as a sole basis for treatment. Nasal washings and aspirates are unacceptable for Xpert Xpress SARS-CoV-2/FLU/RSV testing.  Fact Sheet for Patients: EntrepreneurPulse.com.au  Fact Sheet for Healthcare Providers: IncredibleEmployment.be  This test is not  yet approved or cleared by the [place] and has been authorized for detection and/or diagnosis of SARS-CoV-2 by FDA under an Emergency Use Authorization (EUA). This EUA will remain in effect (meaning this test can be used) for the duration of the COVID-19 declaration under Section 564(b)(1) of the Act, 21 U.S.C. section 360bbb-3(b)(1), unless the authorization is terminated or revoked.  Performed at [facility], [place] [address] [place], [place] [address]   Culture, blood (routine x 2)     Status: None (Preliminary result)   Collection Time: 08/06/21  9:37 AM   Specimen: BLOOD RIGHT HAND  Result Value Ref Range Status   Specimen Description BLOOD RIGHT HAND  Final   Special Requests   Final    BOTTLES DRAWN AEROBIC AND ANAEROBIC Blood Culture adequate volume   Culture   Final    NO GROWTH < 24 HOURS Performed at [facility],  [place] [address], [place], [place] [address]    Report Status PENDING  Incomplete  Urine Culture     Status: None   Collection Time: 08/06/21  1:50 PM   Specimen: Urine, Clean Catch  Result Value Ref Range Status   Specimen Description URINE, CLEAN CATCH  Final   Special Requests NONE  Final   Culture   Final    NO GROWTH Performed at [facility], [place] [address] [place], [place] [address]    Report Status 08/07/2021 FINAL  Final  Surgical pcr screen     Status: None   Collection Time: 08/06/21  7:45 PM   Specimen: Nasal Mucosa; Nasal Swab  Result Value Ref Range Status   MRSA, PCR NEGATIVE NEGATIVE Final   Staphylococcus aureus NEGATIVE NEGATIVE Final    Comment: (NOTE) The Xpert SA Assay (FDA approved for NASAL specimens in patients 4 years of age and older), is one component of a comprehensive surveillance program. It is not intended to diagnose infection nor to guide or monitor treatment. Performed at [facility], [place] [address] [place], [place] [address]     Radiology Studies: CT Angio Chest PE W and/or Wo Contrast  Result Date: 08/06/2021 CLINICAL DATA:  Chest pain, shortness of breath, rule out PE EXAM: CT ANGIOGRAPHY CHEST WITH CONTRAST TECHNIQUE: Multidetector CT imaging of the chest was performed using the standard protocol during bolus administration of intravenous contrast. Multiplanar CT image reconstructions and MIPs were obtained to evaluate the vascular anatomy. CONTRAST:  51m OMNIPAQUE IOHEXOL 350 MG/ML SOLN COMPARISON:  CT chest, 07/22/2021 MR thoracic spine, 08/06/2021 FINDINGS: Cardiovascular: Satisfactory opacification of the pulmonary arteries to the segmental level. No evidence of pulmonary embolism. Cardiomegaly. Left coronary artery calcifications. Trace pericardial effusion. Right upper extremity PICC, tip position near the superior cavoatrial junction. Mediastinum/Nodes: Unchanged enlarged right hilar and subcarinal lymph nodes, largest right  hilar nodes measuring up to 2.3 x 2.1 cm (series 4, image 50). Thyroid gland, trachea, and esophagus demonstrate no significant findings. Lungs/Pleura: Moderate bilateral pleural effusions associated atelectasis or consolidation, new compared to prior examination. Upper Abdomen: No acute abnormality. Musculoskeletal: No chest wall abnormality. There is substantial interval worsening of destructive lytic change about the T4-T5 disc space (series 10, image 94). Overlying paravertebral soft tissue thickening (series 4, image 50). Review of the MIP images confirms the above findings. IMPRESSION: 1. Negative examination for pulmonary embolism. 2. Moderate bilateral pleural effusions and associated atelectasis or consolidation, new compared to prior examination. 3. There is substantial interval worsening of destructive lytic  change about the T4-T5 disc space. Overlying paravertebral soft tissue thickening. Findings are consistent with worsened discitis osteomyelitis but in general better assessed by same-day MR. 4. Unchanged enlarged right hilar and subcarinal lymph nodes, likely reactive. 5. Cardiomegaly and coronary artery disease. Electronically Signed   By: Delanna Ahmadi M.D.   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 Findings in the report and this addendum discussed with Dr. Sherry Ruffing via telephone at 11:55 AM. Electronically Signed   By: Margaretha Sheffield M.D.   On: 08/06/2021 11:59   Result Date: 08/06/2021 CLINICAL DATA:  Mid-back pain Known osteomyelitis of the T-spine, recent biopsy completed EXAM: MRI THORACIC WITHOUT AND WITH CONTRAST TECHNIQUE: Multiplanar and multiecho pulse sequences of the thoracic spine were obtained without and with intravenous contrast. CONTRAST:  24m GADAVIST GADOBUTROL 1 MMOL/ML IV SOLN  COMPARISON:  MRI thoracic spine 07/23/2021. FINDINGS: Alignment: T5 bony retropulsion is detailed below. Otherwise, normal alignment. Vertebrae: Redemonstrated abnormal bone marrow edema and enhancement involving the entirety of the T4 and T5 vertebral bodies as well as the T5 posterior elements. Progressive T5 height loss (50-60%), compatible with pathologic fracture. New 4 mm of bony retropulsion along the superior T5 endplate. Cord:  No definite cord signal abnormality. Paraspinal and other soft tissues: There is increased paraspinal enhancement and edema surrounding the T4 and T5 levels. There is new enhancement in the posterior left paraspinal soft tissues with tiny (subcentimeter) fluid collections in this region (for example series 15, image 13). Disc levels: At T4-T5, there is new/increased epidural enhancement which measures up to 6 mm in thickness ventrally. Abnormal enhancement involves bilateral T4-T5 foramina. Additionally, there is new 4 mm of bony retropulsion along the superior T5 endplate. Resulting new moderate to severe left eccentric canal stenosis at T4 with cord deformity (series 11, image 15). IMPRESSION: 1. Redemonstrated abnormal bone marrow signal and enhancement involving the T4 and T5 levels with increased surrounding paraspinal phlegmon and new epidural phlegmon (6 mm thick ventrally). Interval T5 pathologic fracture (50-60% height loss) with new 4 mm of bony retropulsion. Constellation of findings results in new moderate to severe left eccentric canal stenosis at T4 with cord deformity. 2. Overall, findings are most likely secondary to progressive discitis/osteomyelitis. Superimposed osseous metastatic disease or multiple myeloma is not excluded by imaging. Recommend correlation with the results of reported recent biopsy. 3. Multiple small (subcentimeter) fluid collections in the left posterior paraspinal phlegmon could represent tiny abscesses. Electronically Signed: By: FMargaretha SheffieldM.D. On: 08/06/2021 11:49   CT ABDOMEN PELVIS W CONTRAST  Result Date: 08/06/2021 CLINICAL DATA:  Abdominal distension, chills ongoing antibiotic therapy for osteomyelitis EXAM: CT ABDOMEN AND PELVIS WITH CONTRAST TECHNIQUE: Multidetector CT imaging of the abdomen and pelvis was performed using the standard protocol following bolus administration of intravenous contrast. CONTRAST:  820mOMNIPAQUE IOHEXOL 350 MG/ML SOLN, additional oral enteric contrast COMPARISON:  07/23/2021 FINDINGS: Lower chest: Please see separately reported examination of the chest. Hepatobiliary: No solid liver abnormality is seen. No gallstones, gallbladder wall thickening, or biliary dilatation. Pancreas: Unremarkable. No pancreatic ductal dilatation or surrounding inflammatory changes. Spleen: Normal in size without significant abnormality. Adrenals/Urinary Tract: Adrenal glands are unremarkable. Kidneys are normal, without renal calculi, solid lesion, or hydronephrosis. Distended urinary bladder, measuring up to 19.0 cm. Stomach/Bowel: Stomach is within normal limits. Appendix is  not clearly visualized. No evidence of bowel wall thickening, distention, or inflammatory changes. Generally large burden of stool and stool balls throughout the colon. Vascular/Lymphatic: Aortic atherosclerosis. No enlarged abdominal or pelvic lymph nodes. Reproductive: No mass or other significant abnormality. Other: Anasarca.  No abdominopelvic ascites. Musculoskeletal: No acute or significant osseous findings. IMPRESSION: 1. Generally large burden of stool and stool balls throughout the colon. 2. Distended urinary bladder, measuring up to 19.0 cm. Correlate for urinary retention. 3. Anasarca. Aortic Atherosclerosis (ICD10-I70.0). Electronically Signed   By: Delanna Ahmadi M.D.   On: 08/06/2021 13:37   DG Chest Portable 1 View  Result Date: 08/06/2021 CLINICAL DATA:  Chest pain EXAM: PORTABLE CHEST 1 VIEW COMPARISON:  Ten days ago FINDINGS:  Haziness in the bilateral lower chest with indistinct/streaky density, also seen previously. Normal range heart size for low volume portable technique. Right PICC with tip at the upper right atrium. No edema, effusion, or pneumothorax. IMPRESSION: Stable low volume chest with atelectasis or infiltrates at the bases. Electronically Signed   By: Jorje Guild M.D.   On: 08/06/2021 10:39    Scheduled Meds:  amLODipine  10 mg Oral Daily   Chlorhexidine Gluconate Cloth  6 each Topical Daily   cyclobenzaprine  10 mg Oral TID   feeding supplement (GLUCERNA SHAKE)  237 mL Oral TID BM   gabapentin  300 mg Oral BID   hydrALAZINE  50 mg Oral TID   insulin aspart  0-9 Units Subcutaneous TID WC   lactulose  20 g Oral BID   multivitamin with minerals  1 tablet Oral Daily   mupirocin ointment  1 application Topical BID   senna-docusate  1 tablet Oral BID   sodium chloride flush  3 mL Intravenous Q12H   traZODone  100 mg Oral QHS   Continuous Infusions:  sodium chloride      ceFAZolin (ANCEF) IV 2 g (08/07/21 0520)     LOS: 1 day    Time spent: 35 mins    Mychaela Lennartz, MD [facility]   If 7PM-7AM, please contact night-coverage

## 2021-08-07 NOTE — Progress Notes (Signed)
Pt off the unit to or

## 2021-08-07 NOTE — Anesthesia Preprocedure Evaluation (Addendum)
Anesthesia Evaluation  Patient identified by MRN, date of birth, ID band Patient awake    Reviewed: Allergy & Precautions, NPO status , Patient's Chart, lab work & pertinent test results  Airway Mallampati: III  TM Distance: >3 FB Neck ROM: Full    Dental  (+) Teeth Intact, Dental Advisory Given   Pulmonary former smoker,  Quit smoking 2021, 20 pack year history  COVID + asymptomatic. Was positive in July 2022, but then had a negative test between July and yesterday's test   Pulmonary exam normal breath sounds clear to auscultation       Cardiovascular hypertension, Pt. on medications Normal cardiovascular exam+ Valvular Problems/Murmurs (PFO)  Rhythm:Regular Rate:Normal  Echo 07/29/21: 1. Left ventricular ejection fraction, by estimation, is 60 to 65%. The  left ventricle has normal function. The left ventricle has no regional  wall motion abnormalities.  2. Right ventricular systolic function is normal. The right ventricular  size is normal.  3. No left atrial/left atrial appendage thrombus was detected.  4. The mitral valve is normal in structure. Trivial mitral valve  regurgitation. No evidence of mitral stenosis.  5. The aortic valve is normal in structure. Aortic valve regurgitation is  not visualized. No aortic stenosis is present.  6. The inferior vena cava is normal in size with greater than 50%  respiratory variability, suggesting right atrial pressure of 3 mmHg.  7. Evidence of atrial level shunting detected by color flow Doppler.  Agitated saline contrast bubble study was positive with shunting observed  within 3-6 cardiac cycles suggestive of interatrial shunt. There is a  small patent foramen ovale with  bidirectional shunting across atrial septum.    Neuro/Psych PSYCHIATRIC DISORDERS Anxiety negative neurological ROS     GI/Hepatic negative GI ROS, Neg liver ROS,   Endo/Other  diabetes, Poorly  Controlled, Type 2a1c 7.5   Renal/GU Renal InsufficiencyRenal diseaseCr 1.47  negative genitourinary   Musculoskeletal negative musculoskeletal ROS (+)   Abdominal (+) + obese,   Peds  Hematology  (+) Blood dyscrasia, anemia , H/H 7.8/23.9, plt 500   Anesthesia Other Findings MSSA bacteremia with T4-T5 osteomyelitis discitis- recently hospitalized at [name], now w/ T4-5 spinal cord compression  Reproductive/Obstetrics negative OB ROS                            Anesthesia Physical Anesthesia Plan  ASA: 3  Anesthesia Plan: General   Post-op Pain Management:    Induction: Intravenous  PONV Risk Score and Plan: 2 and Ondansetron, Dexamethasone and Treatment may vary due to age or medical condition  Airway Management Planned: Oral ETT  Additional Equipment: None  Intra-op Plan:   Post-operative Plan: Extubation in OR  Informed Consent: I have reviewed the patients History and Physical, chart, labs and discussed the procedure including the risks, benefits and alternatives for the proposed anesthesia with the patient or authorized representative who has indicated his/her understanding and acceptance.     Dental advisory given  Plan Discussed with: CRNA  Anesthesia Plan Comments:        Anesthesia Quick Evaluation

## 2021-08-07 NOTE — Transfer of Care (Signed)
Immediate Anesthesia Transfer of Care Note  Patient: Jose [name]  Procedure(s) Performed: THORACIC FOUR - THORACIC FIVE LAMINECTOMY/DECOMPRESSION OF SPINAL CORD, DEBRIDEMENT OF ABSCESS, MICRODISCECTOMY, INTRAOPERATIVE ULTRASOUND  Patient Location: PACU  Anesthesia Type:General  Level of Consciousness: awake and alert   Airway & Oxygen Therapy: Patient Spontanous Breathing, Patient connected to nasal cannula oxygen and Patient connected to face mask oxygen  Post-op Assessment: Report given to RN, Post -op Vital signs reviewed and stable and Patient moving all extremities X 4  Post vital signs: Reviewed and stable  Last Vitals:  Vitals Value Taken Time  BP 149/81 08/07/21 1813  Temp    Pulse 99 08/07/21 1814  Resp 15 08/07/21 1814  SpO2 91 % 08/07/21 1814  Vitals shown include unvalidated device data.  Last Pain:  Vitals:   08/07/21 1448  TempSrc:   PainSc: 10-Worst pain ever      Patients Stated Pain Goal: 1 ([id number])  Complications: No notable events documented.

## 2021-08-07 NOTE — Progress Notes (Signed)
Initial Nutrition Assessment  DOCUMENTATION CODES:   Not applicable  INTERVENTION:  -Boost Breeze po TID, each supplement provides 250 kcal and 9 grams of protein -PROSource PLUS PO 57ms BID, each supplement provides 100 kcals and 15 grams of protein -Continue MVI with minerals daily  When diet is advanced to at least full liquids, may discontinue Boost Breeze and resume Glucerna Shake po TID, each supplement provides 220 kcal and 10 grams of protein  NUTRITION DIAGNOSIS:   Increased nutrient needs related to post-op healing as evidenced by estimated needs.  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 Pt was discharged home with abx but reports significant pain and inability to walk or sit-up since being discharged.  CTA chest negative for PE, moderate bilateral pleural effusions and associated atelectasis or consolidation.  CT abdomen and pelvis shows large stool burden, anasarca. MRI thoracic spine shows progressive discitis/osteomyelitis,  moderate to severe L spinal canal stenosis at T5 with cord deformity. Multiple small fluid collection possibly indicating abscesses. Neurosurgery was consulted and pt underwent the following procedures yesterday: T4-T5 laminectomy, left T5 pediculotomy for decompression of spinal cord, intraoperative ultrasound, intraoperative microscope  Pt known to clinical nutrition team from previous admissions. Pt describes pain as piercing and not controlled with oral pain medications. Pt reports that he has been taking his stool softeners and lactulose without any  success. Pt's wife thinks that pt has lost weight, though RD unable to confirm weight loss with available weight readings (see below). Pt does not like Ensure products. Pt with orders for Glucerna TID but should not have these until diet is advanced beyond clear liquids. Will order appropriate clear liquid supplementation in the meantime.   PO Intake: 0% x 2 recorded meals   UOP: 14519mx24 hours I/O: -25330mince admit  Edema: non-pitting edema to BLE per RN assessment  Medications: SSI TID w/ meals, chronulac, mvi with minerals, senokot-s, IV abx Labs: Na 133 (L), Cr 1.47 (H), Alkaline Phosphatase 145 (H) CBGs: 122-191 x24 hours    NUTRITION - FOCUSED PHYSICAL EXAM:  Flowsheet Row Most Recent Value  Orbital Region No depletion  Upper Arm Region No depletion  Thoracic and Lumbar Region No depletion  Buccal Region No depletion  Temple Region No depletion  Clavicle Bone Region No depletion  Clavicle and Acromion Bone Region No depletion  Scapular Bone Region No depletion  Dorsal Hand No depletion  Patellar Region Mild depletion  Anterior Thigh Region Mild depletion  Posterior Calf Region Mild depletion  Edema (RD Assessment) Mild  Hair Reviewed  Eyes Reviewed  Mouth Reviewed  Skin Reviewed  Nails Reviewed       Diet Order:   Diet Order             Diet clear liquid Room service appropriate? Yes; Fluid consistency: Thin  Diet effective now                   EDUCATION NEEDS:   No education needs have been identified at this time  Skin:  Skin Assessment: Skin Integrity Issues: Skin Integrity Issues:: Diabetic  Ulcer Diabetic Ulcer: L big toe  Last BM:  10/15  Height:   Ht Readings from Last 1 Encounters:  08/06/21 '5\' 11"'$  (1.803 m)    Weight:   Wt Readings from Last 10 Encounters:  08/07/21 89.6 kg  07/29/21 86.2 kg  06/11/21 87.2 kg  05/21/21 88.2 kg  03/10/21 91.3 kg  01/08/21 94.9 kg  11/22/19 88 kg  10/25/19 86.7 kg  07/20/19 93.2 kg   05/14/19 94.8 kg    BMI:  Body mass index is 27.55 kg/m.  Estimated Nutritional Needs:   Kcal:  2100-2300  Protein:  110-130 grams`  Fluid:  >2L/d    Larkin Ina, MS, RD, LDN (she/her/hers) RD pager number and weekend/on-call pager number located in [place].

## 2021-08-07 NOTE — Progress Notes (Addendum)
Pacu RN Report to floor given  Gave report to [employer], rm [phone]. Discussed surgery, meds given in OR and Pacu, VS, IV fluids given, EBL, urine output, pain and other pertinent information. Also discussed if pt had any family or friends here or belongings with them. Discussed back dressing. Pt cardiac monitoring.   Discussed that pt had been Covid + in past over 30 days prior. They cared for him as not being +. OR/Anes stated that Rechecked and positive this visit, OR wearing protective wear/masks. Treated as +Covid in Pacu.   Pt exits my care.

## 2021-08-07 NOTE — Anesthesia Procedure Notes (Signed)
Procedure Name: Intubation Date/Time: 08/07/2021 3:41 PM Performed by: Annamary Carolin, CRNA Pre-anesthesia Checklist: Patient identified, Emergency Drugs available, Suction available and Patient being monitored Patient Re-evaluated:Patient Re-evaluated prior to induction Oxygen Delivery Method: Circle System Utilized Preoxygenation: Pre-oxygenation with 100% oxygen Induction Type: IV induction Ventilation: Mask ventilation without difficulty Laryngoscope Size: Glidescope and 3 Grade View: Grade I Tube type: Oral Tube size: 7.5 mm Number of attempts: 1 Airway Equipment and Method: Stylet Placement Confirmation: ETT inserted through vocal cords under direct vision, positive ETCO2 and breath sounds checked- equal and bilateral Secured at: 22 cm Tube secured with: Tape Dental Injury: Teeth and Oropharynx as per pre-operative assessment  Comments: With ease

## 2021-08-07 NOTE — Op Note (Signed)
NEUROSURGERY OPERATIVE NOTE   PREOP DIAGNOSIS:  T4 T5 osteomyelitis T4-5 discitis T5 fracture Spinal cord compression   POSTOP DIAGNOSIS: Same  PROCEDURE: T4 T5 laminectomy, left T5 pediculotomy for decompression of spinal cord Intraoperative ultrasound  Intraoperative microscope  SURGEON: Dr. Consuella Lose, MD  ASSISTANT: Dr. Newman Pies, MD  ANESTHESIA: General Endotracheal  EBL: 250cc  SPECIMENS: Epidural phlegmon for aerobic/anaerobic culture  DRAINS: None  COMPLICATIONS: None immediate  CONDITION: Hemodynamically stable to PACU  HISTORY: Jose [name] is a 45 y.o. male presenting to the emergency department with worsening back pain.  He was recently discharged from [facility] after being diagnosed with T4-T5 discitis osteomyelitis.  His work-up in the emergency department included MRI of the thoracic spine which revealed worsening discitis with fracture of the T5 superior endplate and bony retropulsion causing significant stenosis with spinal cord compression primarily of the left hemicord.  We discussed treatment options and the patient and his wife elected to proceed with surgical decompression.  The risks, benefits, and alternatives to surgery as well as the expected postoperative course and recovery were all reviewed in detail with the patient.  After all his questions were answered informed consent was obtained and witnessed.  PROCEDURE IN DETAIL: The patient was brought to the operating room. After induction of general anesthesia, the patient was positioned on the operative table in the prone position. All pressure points were meticulously padded.  Midline skin incision was then marked out and prepped and draped in the usual sterile fashion.  After timeout was conducted, spinal needles were introduced at the presumed T4-5 interspace.  2 other needles were placed more inferiorly in order to use AP fluoroscopy to count from the 12th rib up to T4-5  space.  Preoperative CT scan was reviewed to confirm 12 ribs.  Once the T4-5 interspace was identified, the midline incision was infiltrated with local anesthetic with epinephrine.  Incision was then made sharply and carried down through the fascia.  Bovie electrocautery was used to open up the thoracodorsal fascia and subperiosteal dissection was carried out along the lamina of T4 and T5.  A dissector was placed in the T4-5 interspace and our position was again confirmed with AP fluoroscopy.  It was relatively clear on AP images the destruction of the superior T5 endplate and [id number] disc space.  At this point self-retaining retractors were placed and the microscope was brought in and the remainder of the decompression was done under the microscope using microdissection technique.  The left T5 transverse process and T4-5 facet complex was identified.  High-speed drill was used to complete a T4 and T5 laminectomy.  There was significant fibrotic tissue in the dorsal epidural space.  I was able to palpate the left T5 pedicle.  High-speed drill was then used to remove the T5 pedicle as well as the inferior articulating process of T4 and the superior articulating process of T5.  The T4 nerve root as well as the top of the T5 nerve root were both identified.  Culture swabs were then obtained from the ventral epidural space.  There was a significant amount of phlegmon within the ventral epidural space which was removed with rongeurs.  A portion was sent for aerobic and anaerobic culture.  Intraoperative ultrasound was then brought in and I was able to visualize the phlegmon and likely fragments of the T5 endplate causing some ventral compression.  A combination of curettes was used in the ventral epidural space in order to remove the  phlegmon and fragments of the T4-5 disc space as well as the T5 vertebral body.  Once this was done, the intraoperative ultrasound was again brought into the field and it did appear that  there was no further significant ventral compression.  At this point hemostasis was secured using a combination of bipolar electrocautery and morselized Gelfoam with thrombin.  Wound was then irrigated with normal saline.  The self-retaining retractors were removed.  Hemostasis on the muscle edges was secured with the bipolar.  Muscle was reapproximated with interrupted 0 Vicryl stitches.  Fascia was closed with interrupted 0 Vicryl stitches.  Deep subcutaneous layer was closed with interrupted 0 Vicryl stitches and the skin was closed with interrupted 3-0 subcuticular stitches.  A layer of Dermabond was applied.  After it dried a sterile dressing was placed.  At the end of the case all sponge, needle, instrument, and cottonoid counts were correct.  Patient was then transferred to the stretcher, extubated, and taken to the postanesthesia care unit in stable hemodynamic condition.   Consuella Lose, MD [facility]

## 2021-08-07 NOTE — Consult Note (Signed)
WOC Nurse Consult Note: Patient receiving care in [facility] 5M05 Known to the [facility] team from previous admissions. Being followed by Podiatry at [facility].  Reason for Consult: Left big toe Wound type: Left great toe wound that has been debrieded and skin grafts by Podiatry. Currently appears to be healing. Pressure Injury POA: NA Wound bed: Dry with no open wounds at this point and no drainage.  Dressing procedure/placement/frequency: Will continue Mupirocin ointment as previously ordered, cover with non-adherent guaze and wrap with small Conform Kerlix Kellie Simmering # [phone])  Monitor the wound area(s) for worsening of condition such as: Signs/symptoms of infection, increase in size, development of or worsening of odor, development of pain, or increased pain at the affected locations.   Notify the medical team if any of these develop.  Thank you for the consult. [place] nurse will not follow at this time.   Please re-consult the [place] team if needed.  Cathlean Marseilles Tamala Julian, MSN, RN, [place], Lysle Pearl, [facility] Associate Pager [phone]

## 2021-08-07 NOTE — Anesthesia Postprocedure Evaluation (Signed)
Anesthesia Post Note  Patient: Jose [name]  Procedure(s) Performed: THORACIC FOUR - THORACIC FIVE LAMINECTOMY/DECOMPRESSION OF SPINAL CORD, DEBRIDEMENT OF ABSCESS, MICRODISCECTOMY, INTRAOPERATIVE ULTRASOUND     Patient location during evaluation: PACU Anesthesia Type: General Level of consciousness: awake and alert Pain management: pain level controlled Vital Signs Assessment: post-procedure vital signs reviewed and stable Respiratory status: spontaneous breathing, nonlabored ventilation, respiratory function stable and patient connected to nasal cannula oxygen Cardiovascular status: blood pressure returned to baseline and stable Postop Assessment: no apparent nausea or vomiting Anesthetic complications: no   No notable events documented.  Last Vitals:  Vitals:   08/07/21 1915 08/07/21 2016  BP: (!) 143/85 (!) 149/83  Pulse: (!) 101 (!) 102  Resp:  17  Temp:  36.7 C  SpO2: 92% 92%    Last Pain:  Vitals:   08/07/21 2016  TempSrc: Oral  PainSc: 10-Worst pain ever                 March Rummage Loren Sawaya

## 2021-08-07 NOTE — Plan of Care (Signed)
  Problem: Clinical Measurements: Goal: Ability to maintain clinical measurements within normal limits will improve Outcome: Progressing   Problem: Coping: Goal: Level of anxiety will decrease Outcome: Progressing   

## 2021-08-08 ENCOUNTER — Encounter (HOSPITAL_COMMUNITY): Payer: Self-pay | Admitting: Neurosurgery

## 2021-08-08 DIAGNOSIS — M4624 Osteomyelitis of vertebra, thoracic region: Secondary | ICD-10-CM | POA: Diagnosis not present

## 2021-08-08 LAB — GLUCOSE, CAPILLARY
Glucose-Capillary: 191 mg/dL — ABNORMAL HIGH (ref 70–99)
Glucose-Capillary: 217 mg/dL — ABNORMAL HIGH (ref 70–99)
Glucose-Capillary: 204 mg/dL — ABNORMAL HIGH (ref 70–99)

## 2021-08-08 LAB — SEDIMENTATION RATE: Sed Rate: 132 mm/hr — ABNORMAL HIGH (ref 0–16)

## 2021-08-08 LAB — TROPONIN I (HIGH SENSITIVITY): Troponin I (High Sensitivity): 6 ng/L (ref <18)

## 2021-08-08 LAB — C-REACTIVE PROTEIN: CRP: 17.3 mg/dL — ABNORMAL HIGH (ref <1.0)

## 2021-08-08 MED ORDER — HYDROMORPHONE HCL 1 MG/ML IJ SOLN: 2.0000 mg | INTRAMUSCULAR | Status: DC | PRN

## 2021-08-08 MED ORDER — SODIUM CHLORIDE 0.9% FLUSH
10.0000 mL | Freq: Two times a day (BID) | INTRAVENOUS | Status: DC
  Administered 2021-08-08 – 2021-08-24 (×26): 10 mL

## 2021-08-08 MED ORDER — METOPROLOL TARTRATE 5 MG/5ML IV SOLN
5.0000 mg | Freq: Four times a day (QID) | INTRAVENOUS | Status: DC | PRN
  Administered 2021-08-08 – 2021-08-12 (×2): 5 mg via INTRAVENOUS
  Filled 2021-08-08 (×2): qty 5

## 2021-08-08 MED ORDER — GUAIFENESIN ER 600 MG PO TB12
600.0000 mg | ORAL_TABLET | Freq: Two times a day (BID) | ORAL | Status: DC
  Administered 2021-08-08 – 2021-08-10 (×2): 600 mg via ORAL
  Filled 2021-08-08 (×3): qty 1

## 2021-08-08 MED ORDER — BOOST / RESOURCE BREEZE PO LIQD CUSTOM
1.0000 | Freq: Three times a day (TID) | ORAL | Status: DC
  Administered 2021-08-11: 1 via ORAL

## 2021-08-08 MED ORDER — HYDROMORPHONE HCL 1 MG/ML IJ SOLN
2.0000 mg | INTRAMUSCULAR | Status: DC | PRN
  Administered 2021-08-08 (×4): 2 mg via INTRAVENOUS
  Filled 2021-08-08 (×4): qty 2

## 2021-08-08 MED ORDER — HYDROMORPHONE HCL 1 MG/ML IJ SOLN
1.0000 mg | Freq: Once | INTRAMUSCULAR | Status: AC
  Administered 2021-08-08: 1 mg via INTRAVENOUS
  Filled 2021-08-08: qty 1

## 2021-08-08 MED ORDER — SODIUM CHLORIDE 0.9 % IV SOLN
25.0000 mg | Freq: Four times a day (QID) | INTRAVENOUS | Status: DC | PRN
  Administered 2021-08-08 – 2021-08-25 (×6): 25 mg via INTRAVENOUS
  Filled 2021-08-08 (×2): qty 1
  Filled 2021-08-08: qty 25
  Filled 2021-08-08 (×5): qty 1

## 2021-08-08 MED ORDER — PROSOURCE PLUS PO LIQD
30.0000 mL | Freq: Two times a day (BID) | ORAL | Status: DC
  Filled 2021-08-08 (×4): qty 30

## 2021-08-08 MED ORDER — HYDROMORPHONE HCL 1 MG/ML IJ SOLN
2.0000 mg | INTRAMUSCULAR | Status: DC | PRN
  Administered 2021-08-09 (×2): 2 mg via INTRAVENOUS
  Filled 2021-08-08 (×2): qty 2

## 2021-08-08 MED ORDER — SODIUM CHLORIDE 0.9% FLUSH: 10.0000 mL | INTRAVENOUS | Status: DC | PRN

## 2021-08-08 NOTE — Progress Notes (Signed)
   08/08/21 0125  Provider Notification  Provider Name/Title Dr. Marlowe Sax  Date Provider Notified 08/08/21  Time Provider Notified [id number]  Notification Type Page  Notification Reason Change in status  Provider response See new orders  Date of Provider Response 08/08/21   Patient came back from OR at shift change.  He was able to log roll and the honeycomb dressing was CDI.  He was c/o severe left anterior rib cage pain - rating as 10/10.  The pain is worse with inspiration.  He is very restless and moaning.  At 2153, he became [place] 2nd to HR - 113 and RR - 24.  Rapid Response RN notified and came to assess patient - at that time, no additional interventions recommended.  Pain continued to be an issue (alternating IV Dilaudid and PO oxycodone).  Also, B/P became elevated, HR elevated, and RR elevated.  Dr. Marlowe Sax paged and made aware of the situation.   Stat EKG obtained and Troponins ordered.  IV Dilaudid changed from 1 mg to 2 mg.  Also, IV Metoprolol was given at 0153.  Pain continued to be an issue.  Patient became nauseated and IV Zofran given.  Patient had no complaints of back pain.  Sharp pain continues to be in the anterior left rib cage.  No complaints of numbness or tingling in the extremities.  Able to move all extremities.  Will continue to monitor patient.  Earleen Reaper RN

## 2021-08-08 NOTE — Progress Notes (Signed)
Subjective: Patient is alert and pleasant.  His back is appropriately sore.  He has some nausea.  Objective: Vital signs in last 24 hours: Temp:  [97.7 F (36.5 C)-99 F (37.2 C)] 99 F (37.2 C) (10/15 0517) Pulse Rate:  [99-124] 124 (10/15 0517) Resp:  [13-24] 19 (10/15 0517) BP: (137-171)/(72-96) 166/82 (10/15 0517) SpO2:  [89 %-94 %] 89 % (10/15 0517) Estimated body mass index is 27.55 kg/m as calculated from the following:   Height as of this encounter: 5' 11"  (1.803 m).   Weight as of this encounter: 89.6 kg.   Intake/Output from previous day: 10/14 0701 - 10/15 0700 In: 1902 [P.O.:240; I.V.:1080; Blood:282; IV Piggyback:300] Out: 0354 [Urine:1450; Blood:300] Intake/Output this shift: No intake/output data recorded.  Physical exam the patient is alert and pleasant.  He slightly weak in his left lower extremity.  Lab Results: Recent Labs    08/06/21 0937 08/07/21 0447 08/07/21 1553  WBC 13.1* 10.0  --   HGB 8.4* 7.8* 7.8*  HCT 26.1* 23.9* 23.0*  PLT 518* 500*  --    BMET Recent Labs    08/06/21 0937 08/07/21 0447 08/07/21 1553  NA 134* 133* 137  K 4.6 4.2 4.8  CL 101 102  --   CO2 20* 24  --   GLUCOSE 167* 275*  --   BUN 21* 19  --   CREATININE 1.50* 1.47*  --   CALCIUM 9.4 9.0  --     Studies/Results: DG Thoracic Spine 2 View  Result Date: 08/07/2021 CLINICAL DATA:  T4-5 laminectomy/decompression with micro discectomy EXAM: THORACIC SPINE 2 VIEWS; DG C-ARM 1-60 MIN-NO REPORT COMPARISON:  CTA chest dated 08/06/2021 FLUOROSCOPY TIME:  19 seconds 9.8 mGy FINDINGS: Intraoperative fluoroscopic spot radiograph of the thoracic spine during T4-5 laminectomy/decompression. IMPRESSION: Intraoperative fluoroscopic images during T4-5 laminectomy/decompression. Electronically Signed   By: Julian Hy M.D.   On: 08/07/2021 20:22   CT Angio Chest PE W and/or Wo Contrast  Result Date: 08/06/2021 CLINICAL DATA:  Chest pain, shortness of breath, rule out PE  EXAM: CT ANGIOGRAPHY CHEST WITH CONTRAST TECHNIQUE: Multidetector CT imaging of the chest was performed using the standard protocol during bolus administration of intravenous contrast. Multiplanar CT image reconstructions and MIPs were obtained to evaluate the vascular anatomy. CONTRAST:  56m OMNIPAQUE IOHEXOL 350 MG/ML SOLN COMPARISON:  CT chest, 07/22/2021 MR thoracic spine, 08/06/2021 FINDINGS: Cardiovascular: Satisfactory opacification of the pulmonary arteries to the segmental level. No evidence of pulmonary embolism. Cardiomegaly. Left coronary artery calcifications. Trace pericardial effusion. Right upper extremity PICC, tip position near the superior cavoatrial junction. Mediastinum/Nodes: Unchanged enlarged right hilar and subcarinal lymph nodes, largest right hilar nodes measuring up to 2.3 x 2.1 cm (series 4, image 50). Thyroid gland, trachea, and esophagus demonstrate no significant findings. Lungs/Pleura: Moderate bilateral pleural effusions associated atelectasis or consolidation, new compared to prior examination. Upper Abdomen: No acute abnormality. Musculoskeletal: No chest wall abnormality. There is substantial interval worsening of destructive lytic change about the T4-T5 disc space (series 10, image 94). Overlying paravertebral soft tissue thickening (series 4, image 50). Review of the MIP images confirms the above findings. IMPRESSION: 1. Negative examination for pulmonary embolism. 2. Moderate bilateral pleural effusions and associated atelectasis or consolidation, new compared to prior examination. 3. There is substantial interval worsening of destructive lytic change about the T4-T5 disc space. Overlying paravertebral soft tissue thickening. Findings are consistent with worsened discitis osteomyelitis but in general better assessed by same-day MR. 4. Unchanged enlarged right hilar and  subcarinal lymph nodes, likely reactive. 5. Cardiomegaly and coronary artery disease. Electronically Signed    By: Delanna Ahmadi M.D.   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 Findings in the report and this addendum discussed with Dr. Sherry Ruffing via telephone at 11:55 AM. Electronically Signed   By: Margaretha Sheffield M.D.   On: 08/06/2021 11:59   Result Date: 08/06/2021 CLINICAL DATA:  Mid-back pain Known osteomyelitis of the T-spine, recent biopsy completed EXAM: MRI THORACIC WITHOUT AND WITH CONTRAST TECHNIQUE: Multiplanar and multiecho pulse sequences of the thoracic spine were obtained without and with intravenous contrast. CONTRAST:  57m GADAVIST GADOBUTROL 1 MMOL/ML IV SOLN COMPARISON:  MRI thoracic spine 07/23/2021. FINDINGS: Alignment: T5 bony retropulsion is detailed below. Otherwise, normal alignment. Vertebrae: Redemonstrated abnormal bone marrow edema and enhancement involving the entirety of the T4 and T5 vertebral bodies as well as the T5 posterior elements. Progressive T5 height loss (50-60%), compatible with pathologic fracture. New 4 mm of bony retropulsion along the superior T5 endplate. Cord:  No definite cord signal abnormality. Paraspinal and other soft tissues: There is increased paraspinal enhancement and edema surrounding the T4 and T5 levels. There is new enhancement in the posterior left paraspinal soft tissues with tiny (subcentimeter) fluid collections in this region (for example series 15, image 13). Disc levels: At T4-T5, there is new/increased epidural enhancement which measures up to 6 mm in thickness ventrally. Abnormal enhancement involves bilateral T4-T5 foramina. Additionally, there is new 4 mm of bony retropulsion along the superior T5 endplate. Resulting new moderate to severe left eccentric canal stenosis at T4 with cord deformity (series 11, image 15). IMPRESSION: 1. Redemonstrated abnormal bone  marrow signal and enhancement involving the T4 and T5 levels with increased surrounding paraspinal phlegmon and new epidural phlegmon (6 mm thick ventrally). Interval T5 pathologic fracture (50-60% height loss) with new 4 mm of bony retropulsion. Constellation of findings results in new moderate to severe left eccentric canal stenosis at T4 with cord deformity. 2. Overall, findings are most likely secondary to progressive discitis/osteomyelitis. Superimposed osseous metastatic disease or multiple myeloma is not excluded by imaging. Recommend correlation with the results of reported recent biopsy. 3. Multiple small (subcentimeter) fluid collections in the left posterior paraspinal phlegmon could represent tiny abscesses. Electronically Signed: By: FMargaretha SheffieldM.D. On: 08/06/2021 11:49   [place]ra[place]  Result Date: 08/07/2021 CLINICAL DATA:  Ultrasound was provided for use by the ordering physician.  No provider Interpretation or professional fees incurred.    CT ABDOMEN PELVIS W CONTRAST  Result Date: 08/06/2021 CLINICAL DATA:  Abdominal distension, chills ongoing antibiotic therapy for osteomyelitis EXAM: CT ABDOMEN AND PELVIS WITH CONTRAST TECHNIQUE: Multidetector CT imaging of the abdomen and pelvis was performed using the standard protocol following bolus administration of intravenous contrast. CONTRAST:  865mOMNIPAQUE IOHEXOL 350 MG/ML SOLN, additional oral enteric contrast COMPARISON:  07/23/2021 FINDINGS: Lower chest: Please see separately reported examination of the chest. Hepatobiliary: No solid liver abnormality is seen. No gallstones, gallbladder wall thickening, or biliary dilatation. Pancreas: Unremarkable. No pancreatic ductal dilatation or surrounding inflammatory changes. Spleen: Normal in size without significant abnormality. Adrenals/Urinary Tract: Adrenal glands are unremarkable. Kidneys are normal, without renal calculi, solid lesion, or hydronephrosis. Distended urinary  bladder, measuring up to 19.0 cm. Stomach/Bowel: Stomach is within normal limits. Appendix is not clearly  visualized. No evidence of bowel wall thickening, distention, or inflammatory changes. Generally large burden of stool and stool balls throughout the colon. Vascular/Lymphatic: Aortic atherosclerosis. No enlarged abdominal or pelvic lymph nodes. Reproductive: No mass or other significant abnormality. Other: Anasarca.  No abdominopelvic ascites. Musculoskeletal: No acute or significant osseous findings. IMPRESSION: 1. Generally large burden of stool and stool balls throughout the colon. 2. Distended urinary bladder, measuring up to 19.0 cm. Correlate for urinary retention. 3. Anasarca. Aortic Atherosclerosis (ICD10-I70.0). Electronically Signed   By: Delanna Ahmadi M.D.   On: 08/06/2021 13:37   DG Chest Portable 1 View  Result Date: 08/06/2021 CLINICAL DATA:  Chest pain EXAM: PORTABLE CHEST 1 VIEW COMPARISON:  Ten days ago FINDINGS: Haziness in the bilateral lower chest with indistinct/streaky density, also seen previously. Normal range heart size for low volume portable technique. Right PICC with tip at the upper right atrium. No edema, effusion, or pneumothorax. IMPRESSION: Stable low volume chest with atelectasis or infiltrates at the bases. Electronically Signed   By: Jorje Guild M.D.   On: 08/06/2021 10:39   DG C-Arm 1-60 Min-No Report  Result Date: 08/07/2021 Fluoroscopy was utilized by the requesting physician.  No radiographic interpretation.    Assessment/Plan: Postop day 1: We will await the cultures.  LOS: 2 days     Ophelia Charter 08/08/2021, 10:14 AM     Patient ID: Jose [name], male   DOB: [birth year], 45 y.o.   MRN: [id number]

## 2021-08-08 NOTE — Progress Notes (Signed)
ID Brief Note   Remains afebrile Patient is s/p T4 T5 laminectomy, left T5 pediculotomy for decompression of spinal cord with NeuroSX. Appreciate their help. OR notes reviewed. OR cultures taken   No updates on blood cultures   Plan  Continue cefazolin as is  Fu OR cultures and blood cultures Monitor CBC and BMP  Baseline ESR and CRP ( ordered )  Rosiland Oz, MD Infectious Disease Physician [facility] [address] [place], [place] [address] Phone: [phone]  Fax: [phone]

## 2021-08-08 NOTE — Plan of Care (Signed)

## 2021-08-08 NOTE — Progress Notes (Signed)
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  Patient was discharged home with antibiotics until September 04, 2021.  Patient reports since his discharge he continued to have significant amount of pain and he is unable to walk or sit up.  He described pain as piercing, not controlled with oral pain medications.  Patient has been taking his stool softeners and lactulose without any success. CTA chest negative for PE, moderate bilateral pleural effusions and associated atelectasis or consolidation.  CT abdomen and pelvis shows large stool burden, anasarca.  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 Multiple small fluid collections in the left posterior paraspinal phlegmon could represent tiny abscesses. Patient is started on IV antibiotics, infectious disease and neurosurgery consulted.  Neurosurgery consulted, Patient underwent T4 T5 laminectomy, left T5 pediculotomy for decompression of  spinal cord.   Assessment & Plan:   Principal Problem:   Osteomyelitis of thoracic spine (HCC) Active Problems:   Type 2 diabetes mellitus with diabetic polyneuropathy, without long-term current use of insulin (HCC)   Hyperlipidemia   GAD (generalized anxiety disorder)   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)   Essential hypertension   CKD (chronic kidney disease) stage 3, GFR 30-59 ml/min (HCC)   Pleural effusion, bilateral  MSSA bacteremia with osteomyelitis of thoracic spine: Patient diagnosed with MSSA bacteremia and with known osteomyelitis of his thoracic spine (biopsy  confirmed) He presented with worsening pain, found to have worsening of his osteomyelitis, T5 pathological fracture, new moderate to severe left eccentric canal stenosis at T5 with cord deformity. Neurosurgery consulted,  underwent T4 T5 laminectomy, left T5 pediculotomy for decompression of  spinal cord on 10/14. Continue pain control with oxycodone and IV Dilaudid as needed. Continue cefazolin as per infectious disease for MSSA. Follow-up OR cultures and blood cultures. TEE performed on 07/29/2021 at previous hospitalization.  Bilateral pleural effusions: CT chest negative for PE but shows bilateral pleural effusions with associated atelectasis or consolidation. Patient denies any cough, shortness of breath or fever, chills. No need for thoracocentesis at this time.  Constipation: Patient does not have a bowel movement since being on pain medication after discharge Continue lactulose, senna, escalate as needed  Type 2 diabetes with neuropathy: Continue regular insulin sliding scale. Last hemoglobin A1c 7.5  Essential hypertension: Blood pressure fairly controlled. Continue amlodipine, hydralazine and metoprolol.  CKD stage IIIb: Serum creatinine appears stable and improved from last hospitalization Avoid nephrotoxic medications, continue to monitor serum creatinine  Hyperlipidemia: Continue statin  Generalized anxiety disorder: Continue Xanax  Diabetic ulcer of the left great toe: Seems stable, no signs of infection. Patient was seen by podiatry at last hospitalization Recommended daily wound care with mupirocin and dry sterile dressing Wound care consult  Pilonidal cyst: Unable to examine because of back pain  COVID-19: Patient had positive test in July,  more than 8 weeks ago, no longer needs airborne precautions.  Normochromic normocytic anemia. Likely anemia of chronic disease.   DVT prophylaxis: SCDs Code Status: Full code. Family Communication: No family at  bed side. Disposition Plan:   Status is: Inpatient  Remains inpatient appropriate because:  requiring neurosurgery and debridement of abscess. Anticipated discharge: Home  Consultants:  Neurosurgery Infectious diseases  Procedures: Scheduled laminectomy, debridement of abscess Antimicrobials:   Anti-infectives (From admission, onward)    Start     Dose/Rate Route Frequency Ordered Stop   08/06/21 2200  ceFAZolin (ANCEF) IVPB 2g/100 mL premix        2 g 200 mL/hr over 30 Minutes Intravenous Every 8 hours 08/06/21 1829     08/06/21 1415  vancomycin (VANCOREADY) IVPB 1750 mg/350 mL  Status:  Discontinued        1,750 mg 175 mL/hr over 120 Minutes Intravenous Every 24 hours 08/06/21 1400 08/06/21 1829   08/06/21 1415  ceFEPIme (MAXIPIME) 2 g in sodium chloride 0.9 % 100 mL IVPB  Status:  Discontinued        2 g 200 mL/hr over 30 Minutes Intravenous Every 8 hours 08/06/21 1400 08/06/21 1829       Subjective: Patient was seen and examined at bedside.  Overnight events noted.   Patient is s/p T4-T5 laminectomy and debridement of the abscess. Patient reports being in a lot of pain, reports pain medications help with pain for few hours. Patient reports nausea but denies any vomiting.  Objective: Vitals:   08/07/21 2300 08/08/21 0120 08/08/21 0517 08/08/21 1046  BP: (!) 159/91 (!) 171/89 (!) 166/82 140/84  Pulse: (!) 117 (!) 122 (!) 124 (!) 105  Resp: 20 (!) 23 19 18   Temp: 98 F (36.7 C) 98.5 F (36.9 C) 99 F (37.2 C) 98.7 F (37.1 C)  TempSrc: Oral Oral Oral Oral  SpO2: 94% 92% (!) 89% 90%  Weight:      Height:        Intake/Output Summary (Last 24 hours) at 08/08/2021 1114 Last data filed at 08/08/2021 0600 Gross per 24 hour  Intake 1902 ml  Output 1750 ml  Net 152 ml   Filed Weights   08/06/21 0847 08/07/21 0628  Weight: 86.2 kg 89.6 kg    Examination:  General exam: Appears uncomfortable because of pain, lying on the back  respiratory system: Clear to  auscultation bilaterally, RR 15. Cardiovascular system: S1-S2 heard, regular rate and rhythm, no murmur. Gastrointestinal system: Abdomen is soft, nontender, nondistended, BS + Central nervous system: Alert and oriented x 3. No focal neurological deficits. Extremities: No edema, partial erythematous wound in the left great big toe. Skin: No rashes, lesions or ulcers Psychiatry: Judgement and insight appear normal. Mood & affect appropriate.     Data Reviewed: I have personally reviewed following labs and imaging studies  CBC: Recent Labs  Lab 08/02/21 1421 08/06/21 0937 08/07/21 0447 08/07/21 1553  WBC 16.9* 13.1* 10.0  --   NEUTROABS  --  10.0*  --   --   HGB 7.8* 8.4* 7.8* 7.8*  HCT 24.1* 26.1* 23.9* 23.0*  MCV 86.4 85.9 84.2  --   PLT 385 518* 500*  --    Basic Metabolic Panel: Recent Labs  Lab 08/02/21 1421 08/03/21 0541 08/06/21 0937 08/07/21 0447 08/07/21 1553  NA 135 134* 134* 133* 137  K 4.9 4.8 4.6 4.2 4.8  CL 101 103 101 102  --   CO2 25 25 20* 24  --   GLUCOSE 241* 212* 167* 275*  --   BUN 39* 40* 21* 19  --   CREATININE 2.32* 2.20* 1.50* 1.47*  --   CALCIUM 9.2 9.2 9.4 9.0  --    GFR: Estimated Creatinine Clearance: 66.2 mL/min (A) (by C-G  formula based on SCr of 1.47 mg/dL (H)). Liver Function Tests: Recent Labs  Lab 08/06/21 0937 08/07/21 0447  AST 17 13*  ALT <5 <5  ALKPHOS 163* 145*  BILITOT 0.5 0.6  PROT 7.0 6.1*  ALBUMIN 2.0* 1.7*   Recent Labs  Lab 08/06/21 0937  LIPASE 21   No results for input(s): AMMONIA in the last 168 hours. Coagulation Profile: No results for input(s): INR, PROTIME in the last 168 hours. Cardiac Enzymes: No results for input(s): CKTOTAL, CKMB, CKMBINDEX, TROPONINI in the last 168 hours. BNP (last 3 results) No results for input(s): PROBNP in the last 8760 hours. HbA1C: No results for input(s): HGBA1C in the last 72 hours. CBG: Recent Labs  Lab 08/07/21 1137 08/07/21 1511 08/07/21 1815 08/07/21 2039  08/08/21 0743  GLUCAP 148* 122* 157* 173* 191*   Lipid Profile: No results for input(s): CHOL, HDL, LDLCALC, TRIG, CHOLHDL, LDLDIRECT in the last 72 hours. Thyroid Function Tests: No results for input(s): TSH, T4TOTAL, FREET4, T3FREE, THYROIDAB in the last 72 hours. Anemia Panel: No results for input(s): VITAMINB12, FOLATE, FERRITIN, TIBC, IRON, RETICCTPCT in the last 72 hours. Sepsis Labs: Recent Labs  Lab 08/06/21 3646 08/06/21 1828  LATICACIDVEN 0.6 0.5    Recent Results (from the past 240 hour(s))  Culture, blood (routine x 2)     Status: None (Preliminary result)   Collection Time: 08/06/21  9:10 AM   Specimen: BLOOD  Result Value Ref Range Status   Specimen Description BLOOD SITE NOT SPECIFIED  Final   Special Requests   Final    BOTTLES DRAWN AEROBIC AND ANAEROBIC Blood Culture results may not be optimal due to an inadequate volume of blood received in culture bottles   Culture   Final    NO GROWTH 2 DAYS Performed at [facility], [place] [address], [place], [place] [address]    Report Status PENDING  Incomplete  Resp Panel by RT-PCR (Flu A&B, Covid) Nasopharyngeal Swab     Status: Abnormal   Collection Time: 08/06/21  9:36 AM   Specimen: Nasopharyngeal Swab; Nasopharyngeal(NP) swabs in vial transport medium  Result Value Ref Range Status   SARS Coronavirus 2 by RT PCR POSITIVE (A) NEGATIVE Final    Comment: rn sarah b. [phone] fcp (NOTE) SARS-CoV-2 target nucleic acids are DETECTED.  The SARS-CoV-2 RNA is generally detectable in upper respiratory specimens during the acute phase of infection. Positive results are indicative of the presence of the identified virus, but do not rule out bacterial infection or co-infection with other pathogens not detected by the test. Clinical correlation with patient history and other diagnostic information is necessary to determine patient infection status. The expected result is Negative.  Fact Sheet for  Patients: EntrepreneurPulse.com.au  Fact Sheet for Healthcare Providers: IncredibleEmployment.be  This test is not yet approved or cleared by the [place] FDA and  has been authorized for detection and/or diagnosis of SARS-CoV-2 by FDA under an Emergency Use Authorization (EUA).  This EUA will remain in effect (meaning this test can be used) for the duration of  the COVID-19 declaration und er Section 564(b)(1) of the Act, 21 U.S.C. section 360bbb-3(b)(1), unless the authorization is terminated or revoked sooner.     Influenza A by PCR NEGATIVE NEGATIVE Final   Influenza B by PCR NEGATIVE NEGATIVE Final    Comment: (NOTE) The Xpert Xpress SARS-CoV-2/FLU/RSV plus assay is intended as an aid in the diagnosis of influenza from Nasopharyngeal swab specimens and should not be  used as a sole basis for treatment. Nasal washings and aspirates are unacceptable for Xpert Xpress SARS-CoV-2/FLU/RSV testing.  Fact Sheet for Patients: EntrepreneurPulse.com.au  Fact Sheet for Healthcare Providers: IncredibleEmployment.be  This test is not yet approved or cleared by the [place] FDA and has been authorized for detection and/or diagnosis of SARS-CoV-2 by FDA under an Emergency Use Authorization (EUA). This EUA will remain in effect (meaning this test can be used) for the duration of the COVID-19 declaration under Section 564(b)(1) of the Act, 21 U.S.C. section 360bbb-3(b)(1), unless the authorization is terminated or revoked.  Performed at [facility], [place] [address], [place], [place] [address]   Culture, blood (routine x 2)     Status: None (Preliminary result)   Collection Time: 08/06/21  9:37 AM   Specimen: BLOOD RIGHT HAND  Result Value Ref Range Status   Specimen Description BLOOD RIGHT HAND  Final   Special Requests   Final    BOTTLES DRAWN AEROBIC AND ANAEROBIC Blood Culture adequate volume    Culture   Final    NO GROWTH 2 DAYS Performed at [facility], [place] [address], [place], [place] [address]    Report Status PENDING  Incomplete  Urine Culture     Status: None   Collection Time: 08/06/21  1:50 PM   Specimen: Urine, Clean Catch  Result Value Ref Range Status   Specimen Description URINE, CLEAN CATCH  Final   Special Requests NONE  Final   Culture   Final    NO GROWTH Performed at [facility], [place] [address] [place], [place] [address]    Report Status 08/07/2021 FINAL  Final  Surgical pcr screen     Status: None   Collection Time: 08/06/21  7:45 PM   Specimen: Nasal Mucosa; Nasal Swab  Result Value Ref Range Status   MRSA, PCR NEGATIVE NEGATIVE Final   Staphylococcus aureus NEGATIVE NEGATIVE Final    Comment: (NOTE) The Xpert SA Assay (FDA approved for NASAL specimens in patients 69 years of age and older), is one component of a comprehensive surveillance program. It is not intended to diagnose infection nor to guide or monitor treatment. Performed at [facility], [place] [address], [place], [place] [address]   Aerobic/Anaerobic Culture w Gram Stain (surgical/deep wound)     Status: None (Preliminary result)   Collection Time: 08/07/21  5:04 PM   Specimen: PATH Other; Tissue  Result Value Ref Range Status   Specimen Description ABSCESS  Final   Special Requests EPIDURAL SWABS PT ON ANCEF VANC MAXIPIME  Final   Gram Stain   Final    NO WBC SEEN NO ORGANISMS SEEN Performed at [facility], [place] [address] [place], [place] [address]    Culture PENDING  Incomplete   Report Status PENDING  Incomplete  Aerobic/Anaerobic Culture w Gram Stain (surgical/deep wound)     Status: None (Preliminary result)   Collection Time: 08/07/21  5:05 PM   Specimen: PATH Other; Tissue  Result Value Ref Range Status   Specimen Description TISSUE  Final   Special Requests EPIDURAL PT ON VANC MAXIPIME ANCEF  Final   Gram Stain   Final    NO WBC SEEN NO  ORGANISMS SEEN Performed at [facility], [place] [address] [place], [place] [address]    Culture PENDING  Incomplete   Report Status PENDING  Incomplete    Radiology Studies: DG Thoracic Spine 2 View  Result Date: 08/07/2021 CLINICAL DATA:  T4-5 laminectomy/decompression with micro discectomy EXAM: THORACIC SPINE 2 VIEWS; DG C-ARM 1-60 MIN-NO REPORT COMPARISON:  CTA chest dated 08/06/2021 FLUOROSCOPY TIME:  19 seconds 9.8 mGy FINDINGS: Intraoperative fluoroscopic spot radiograph of the thoracic spine during T4-5 laminectomy/decompression. IMPRESSION: Intraoperative fluoroscopic images during T4-5 laminectomy/decompression. Electronically Signed   By: Julian Hy M.D.   On: 08/07/2021 20:22   CT Angio Chest PE W and/or Wo Contrast  Result Date: 08/06/2021 CLINICAL DATA:  Chest pain, shortness of breath, rule out PE EXAM: CT ANGIOGRAPHY CHEST WITH CONTRAST TECHNIQUE: Multidetector CT imaging of the chest was performed using the standard protocol during bolus administration of intravenous contrast. Multiplanar CT image reconstructions and MIPs were obtained to evaluate the vascular anatomy. CONTRAST:  98m OMNIPAQUE IOHEXOL 350 MG/ML SOLN COMPARISON:  CT chest, 07/22/2021 MR thoracic spine, 08/06/2021 FINDINGS: Cardiovascular: Satisfactory opacification of the pulmonary arteries to the segmental level. No evidence of pulmonary embolism. Cardiomegaly. Left coronary artery calcifications. Trace pericardial effusion. Right upper extremity PICC, tip position near the superior cavoatrial junction. Mediastinum/Nodes: Unchanged enlarged right hilar and subcarinal lymph nodes, largest right hilar nodes measuring up to 2.3 x 2.1 cm (series 4, image 50). Thyroid gland, trachea, and esophagus demonstrate no significant findings. Lungs/Pleura: Moderate bilateral pleural effusions associated atelectasis or consolidation, new compared to prior examination. Upper Abdomen: No acute abnormality.  Musculoskeletal: No chest wall abnormality. There is substantial interval worsening of destructive lytic change about the T4-T5 disc space (series 10, image 94). Overlying paravertebral soft tissue thickening (series 4, image 50). Review of the MIP images confirms the above findings. IMPRESSION: 1. Negative examination for pulmonary embolism. 2. Moderate bilateral pleural effusions and associated atelectasis or consolidation, new compared to prior examination. 3. There is substantial interval worsening of destructive lytic change about the T4-T5 disc space. Overlying paravertebral soft tissue thickening. Findings are consistent with worsened discitis osteomyelitis but in general better assessed by same-day MR. 4. Unchanged enlarged right hilar and subcarinal lymph nodes, likely reactive. 5. Cardiomegaly and coronary artery disease. Electronically Signed   By: ADelanna AhmadiM.D.   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 Findings in the report and this addendum discussed with Dr. TSherry Ruffingvia telephone at 11:55 AM. Electronically Signed   By: FMargaretha SheffieldM.D.   On: 08/06/2021 11:59   Result Date: 08/06/2021 CLINICAL DATA:  Mid-back pain Known osteomyelitis of the T-spine, recent biopsy completed EXAM: MRI THORACIC WITHOUT AND WITH CONTRAST TECHNIQUE: Multiplanar and multiecho pulse sequences of the thoracic spine were obtained without and with intravenous contrast. CONTRAST:  920mGADAVIST GADOBUTROL 1 MMOL/ML IV SOLN COMPARISON:  MRI thoracic spine 07/23/2021. FINDINGS: Alignment: T5 bony retropulsion is detailed below. Otherwise, normal alignment. Vertebrae: Redemonstrated abnormal bone marrow edema and enhancement involving the entirety of the T4 and T5 vertebral bodies as well as the T5 posterior elements. Progressive  T5 height loss (50-60%), compatible with pathologic fracture. New 4 mm of bony retropulsion along the superior T5 endplate. Cord:  No definite cord signal abnormality. Paraspinal and other soft tissues: There is increased paraspinal enhancement and edema surrounding the T4 and T5 levels. There is new enhancement in the posterior left paraspinal soft tissues with tiny (subcentimeter) fluid collections in this region (for example series 15, image 13). Disc levels: At T4-T5, there is  new/increased epidural enhancement which measures up to 6 mm in thickness ventrally. Abnormal enhancement involves bilateral T4-T5 foramina. Additionally, there is new 4 mm of bony retropulsion along the superior T5 endplate. Resulting new moderate to severe left eccentric canal stenosis at T4 with cord deformity (series 11, image 15). IMPRESSION: 1. Redemonstrated abnormal bone marrow signal and enhancement involving the T4 and T5 levels with increased surrounding paraspinal phlegmon and new epidural phlegmon (6 mm thick ventrally). Interval T5 pathologic fracture (50-60% height loss) with new 4 mm of bony retropulsion. Constellation of findings results in new moderate to severe left eccentric canal stenosis at T4 with cord deformity. 2. Overall, findings are most likely secondary to progressive discitis/osteomyelitis. Superimposed osseous metastatic disease or multiple myeloma is not excluded by imaging. Recommend correlation with the results of reported recent biopsy. 3. Multiple small (subcentimeter) fluid collections in the left posterior paraspinal phlegmon could represent tiny abscesses. Electronically Signed: By: Margaretha Sheffield M.D. On: 08/06/2021 11:49   [place] Intraoperative  Result Date: 08/07/2021 CLINICAL DATA:  Ultrasound was provided for use by the ordering physician.  No provider Interpretation or professional fees incurred.    CT ABDOMEN PELVIS W CONTRAST  Result Date: 08/06/2021 CLINICAL DATA:  Abdominal  distension, chills ongoing antibiotic therapy for osteomyelitis EXAM: CT ABDOMEN AND PELVIS WITH CONTRAST TECHNIQUE: Multidetector CT imaging of the abdomen and pelvis was performed using the standard protocol following bolus administration of intravenous contrast. CONTRAST:  58m OMNIPAQUE IOHEXOL 350 MG/ML SOLN, additional oral enteric contrast COMPARISON:  07/23/2021 FINDINGS: Lower chest: Please see separately reported examination of the chest. Hepatobiliary: No solid liver abnormality is seen. No gallstones, gallbladder wall thickening, or biliary dilatation. Pancreas: Unremarkable. No pancreatic ductal dilatation or surrounding inflammatory changes. Spleen: Normal in size without significant abnormality. Adrenals/Urinary Tract: Adrenal glands are unremarkable. Kidneys are normal, without renal calculi, solid lesion, or hydronephrosis. Distended urinary bladder, measuring up to 19.0 cm. Stomach/Bowel: Stomach is within normal limits. Appendix is not clearly visualized. No evidence of bowel wall thickening, distention, or inflammatory changes. Generally large burden of stool and stool balls throughout the colon. Vascular/Lymphatic: Aortic atherosclerosis. No enlarged abdominal or pelvic lymph nodes. Reproductive: No mass or other significant abnormality. Other: Anasarca.  No abdominopelvic ascites. Musculoskeletal: No acute or significant osseous findings. IMPRESSION: 1. Generally large burden of stool and stool balls throughout the colon. 2. Distended urinary bladder, measuring up to 19.0 cm. Correlate for urinary retention. 3. Anasarca. Aortic Atherosclerosis (ICD10-I70.0). Electronically Signed   By: ADelanna AhmadiM.D.   On: 08/06/2021 13:37   DG C-Arm 1-60 Min-No Report  Result Date: 08/07/2021 Fluoroscopy was utilized by the requesting physician.  No radiographic interpretation.    Scheduled Meds:  (feeding supplement) PROSource Plus  30 mL Oral BID BM   amLODipine  10 mg Oral Daily    Chlorhexidine Gluconate Cloth  6 each Topical Daily   cyclobenzaprine  10 mg Oral TID   feeding supplement  1 Container Oral TID BM   feeding supplement (GLUCERNA SHAKE)  237 mL Oral TID BM   gabapentin  300 mg Oral BID   guaiFENesin  600 mg Oral BID   hydrALAZINE  50 mg Oral TID   insulin aspart  0-9 Units Subcutaneous TID WC   lactulose  20 g Oral BID   multivitamin with minerals  1 tablet Oral Daily   mupirocin ointment  1 application Topical BID   senna-docusate  1 tablet Oral BID   sodium chloride flush  10-40 mL  Intracatheter Q12H   sodium chloride flush  3 mL Intravenous Q12H   traZODone  100 mg Oral QHS   Continuous Infusions:  sodium chloride      ceFAZolin (ANCEF) IV 2 g (08/08/21 0520)     LOS: 2 days    Time spent: 25 mins    Carliss Porcaro, MD [facility]   If 7PM-7AM, please contact night-coverage

## 2021-08-08 NOTE — Progress Notes (Signed)
Offered to change patient dressing to foot ulcers, patient did not want done at this time. Educated patient on importance of wound care.   Ashlynn Gunnels, Tivis Ringer, RN

## 2021-08-09 DIAGNOSIS — M4624 Osteomyelitis of vertebra, thoracic region: Secondary | ICD-10-CM | POA: Diagnosis not present

## 2021-08-09 LAB — CBC
HCT: 24.8 % — ABNORMAL LOW (ref 39.0–52.0)
Hemoglobin: 8.1 g/dL — ABNORMAL LOW (ref 13.0–17.0)
MCH: 28.3 pg (ref 26.0–34.0)
MCHC: 32.7 g/dL (ref 30.0–36.0)
MCV: 86.7 fL (ref 80.0–100.0)
Platelets: 492 10*3/uL — ABNORMAL HIGH (ref 150–400)
RBC: 2.86 MIL/uL — ABNORMAL LOW (ref 4.22–5.81)
RDW: 13.6 % (ref 11.5–15.5)
WBC: 24.8 10*3/uL — ABNORMAL HIGH (ref 4.0–10.5)
nRBC: 0 % (ref 0.0–0.2)

## 2021-08-09 LAB — COMPREHENSIVE METABOLIC PANEL
ALT: 6 U/L (ref 0–44)
AST: 13 U/L — ABNORMAL LOW (ref 15–41)
Albumin: 1.7 g/dL — ABNORMAL LOW (ref 3.5–5.0)
Alkaline Phosphatase: 122 U/L (ref 38–126)
Anion gap: 14 (ref 5–15)
BUN: 35 mg/dL — ABNORMAL HIGH (ref 6–20)
CO2: 20 mmol/L — ABNORMAL LOW (ref 22–32)
Calcium: 8.5 mg/dL — ABNORMAL LOW (ref 8.9–10.3)
Chloride: 100 mmol/L (ref 98–111)
Creatinine, Ser: 2.48 mg/dL — ABNORMAL HIGH (ref 0.61–1.24)
GFR, Estimated: 31 mL/min — ABNORMAL LOW (ref >60)
Glucose, Bld: 247 mg/dL — ABNORMAL HIGH (ref 70–99)
Potassium: 4.7 mmol/L (ref 3.5–5.1)
Sodium: 134 mmol/L — ABNORMAL LOW (ref 135–145)
Total Bilirubin: 0.9 mg/dL (ref 0.3–1.2)
Total Protein: 6.1 g/dL — ABNORMAL LOW (ref 6.5–8.1)

## 2021-08-09 LAB — MAGNESIUM: Magnesium: 2 mg/dL (ref 1.7–2.4)

## 2021-08-09 LAB — PHOSPHORUS: Phosphorus: 4.6 mg/dL (ref 2.5–4.6)

## 2021-08-09 LAB — GLUCOSE, CAPILLARY
Glucose-Capillary: 244 mg/dL — ABNORMAL HIGH (ref 70–99)
Glucose-Capillary: 264 mg/dL — ABNORMAL HIGH (ref 70–99)
Glucose-Capillary: 258 mg/dL — ABNORMAL HIGH (ref 70–99)
Glucose-Capillary: 251 mg/dL — ABNORMAL HIGH (ref 70–99)

## 2021-08-09 LAB — HEMOGLOBIN AND HEMATOCRIT, BLOOD
HCT: 24.6 % — ABNORMAL LOW (ref 39.0–52.0)
Hemoglobin: 7.9 g/dL — ABNORMAL LOW (ref 13.0–17.0)

## 2021-08-09 MED ORDER — BACLOFEN 10 MG PO TABS
10.0000 mg | ORAL_TABLET | Freq: Three times a day (TID) | ORAL | Status: DC
  Administered 2021-08-09 – 2021-08-11 (×6): 10 mg via ORAL
  Filled 2021-08-09 (×7): qty 1

## 2021-08-09 MED ORDER — FENTANYL CITRATE PF 50 MCG/ML IJ SOSY
50.0000 ug | PREFILLED_SYRINGE | INTRAMUSCULAR | Status: DC | PRN
  Administered 2021-08-09 – 2021-08-10 (×11): 50 ug via INTRAVENOUS
  Filled 2021-08-09 (×12): qty 1

## 2021-08-09 MED ORDER — PANTOPRAZOLE SODIUM 40 MG IV SOLR
40.0000 mg | Freq: Two times a day (BID) | INTRAVENOUS | Status: DC
  Administered 2021-08-09 – 2021-08-12 (×6): 40 mg via INTRAVENOUS
  Filled 2021-08-09 (×6): qty 40

## 2021-08-09 MED ORDER — OXYCODONE HCL 5 MG PO TABS
5.0000 mg | ORAL_TABLET | ORAL | Status: DC | PRN
  Administered 2021-08-10: 5 mg via ORAL
  Filled 2021-08-09: qty 1

## 2021-08-09 MED ORDER — FLEET ENEMA 7-19 GM/118ML RE ENEM
1.0000 | ENEMA | Freq: Every day | RECTAL | Status: DC | PRN
  Filled 2021-08-09: qty 1

## 2021-08-09 MED ORDER — OXYCODONE HCL ER 15 MG PO T12A
15.0000 mg | EXTENDED_RELEASE_TABLET | Freq: Two times a day (BID) | ORAL | Status: DC
  Administered 2021-08-09 – 2021-08-11 (×4): 15 mg via ORAL
  Filled 2021-08-09 (×3): qty 1

## 2021-08-09 MED ORDER — SODIUM CHLORIDE 0.9 % IV SOLN: INTRAVENOUS | Status: AC

## 2021-08-09 NOTE — Progress Notes (Signed)
   Providing Compassionate, Quality Care - Together   Subjective: Patient reports intractable hiccups not responding to chlorpromazine. Patient's reports pain is a 10/10 and persistent constipation.  Objective: Vital signs in last 24 hours: Temp:  [98.1 F (36.7 C)-99.6 F (37.6 C)] 98.1 F (36.7 C) (10/16 0946) Pulse Rate:  [78-123] 121 (10/16 0946) Resp:  [17-24] 19 (10/16 0946) BP: (138-161)/(71-87) 161/81 (10/16 0946) SpO2:  [88 %-99 %] 92 % (10/16 0946) Weight:  [89.3 kg] 89.3 kg (10/16 0502)  Intake/Output from previous day: 10/15 0701 - 10/16 0700 In: 450 [P.O.:120; I.V.:80; IV Piggyback:250] Out: 0  Intake/Output this shift: No intake/output data recorded.  Alert and oriented x 4 PERRLA CN II-XII grossly intact MAE, Sensation intact. Strength difficult to assess due to patient's present pain level. Thoracic incision is covered with Honeycomb dressing; Dressing is clean, dry, and intact   Lab Results: Recent Labs    08/07/21 0447 08/07/21 1553 08/09/21 0311  WBC 10.0  --  24.8*  HGB 7.8* 7.8* 8.1*  HCT 23.9* 23.0* 24.8*  PLT 500*  --  492*   BMET Recent Labs    08/07/21 0447 08/07/21 1553 08/09/21 0311  NA 133* 137 134*  K 4.2 4.8 4.7  CL 102  --  100  CO2 24  --  20*  GLUCOSE 275*  --  247*  BUN 19  --  35*  CREATININE 1.47*  --  2.48*  CALCIUM 9.0  --  8.5*    Studies/Results: DG Thoracic Spine 2 View  Result Date: 08/07/2021 CLINICAL DATA:  T4-5 laminectomy/decompression with micro discectomy EXAM: THORACIC SPINE 2 VIEWS; DG C-ARM 1-60 MIN-NO REPORT COMPARISON:  CTA chest dated 08/06/2021 FLUOROSCOPY TIME:  19 seconds 9.8 mGy FINDINGS: Intraoperative fluoroscopic spot radiograph of the thoracic spine during T4-5 laminectomy/decompression. IMPRESSION: Intraoperative fluoroscopic images during T4-5 laminectomy/decompression. Electronically Signed   By: Julian Hy M.D.   On: 08/07/2021 20:22   [place] Intraoperative  Result Date:  08/07/2021 CLINICAL DATA:  Ultrasound was provided for use by the ordering physician.  No provider Interpretation or professional fees incurred.    DG C-Arm 1-60 Min-No Report  Result Date: 08/07/2021 Fluoroscopy was utilized by the requesting physician.  No radiographic interpretation.    Assessment/Plan: Patient underwent T4-5 laminectomy and left T5 pediculotomy by Dr. Kathyrn Sheriff for decompression of spinal cord due to T4-5 discitis/osteomyelitis.   LOS: 3 days   -Awaiting wound cultures -Added Oxycontin 15 mg BID and changed oxycodone IR to 5 mg every 4 hours to try to get more even control of patient's pain. Patient requested his IV pain medication to be switched from Dilaudid as he feels the Dilaudid is contributing to his nausea. -Will trial Baclofen for muscle spasms/intractable hiccups.   Viona Gilmore, DNP, AGNP-C Nurse Practitioner  [facility] [place] [address], [place], [place] [address] P: [phone]    F: [phone]  08/09/2021, 10:27 AM

## 2021-08-09 NOTE — Progress Notes (Signed)
PROGRESS NOTE    Jose [name]  [id number] DOB: 09-06-1976 DOA: 08/06/2021 PCP: [name], NP    Brief Narrative:  This 45 years old male with PMH significant for type 2 diabetes with diabetic retinopathy, neuropathy, hypertension, CKD stage IIIb, hyperlipidemia, GAD, anemia, recent hospitalization for MSSA bacteremia and osteomyelitis of  thoracic spine at T5-T6.  Patient was discharged home with antibiotics until September 04, 2021.  Patient reports since his discharge he continued to have significant amount of pain and he is unable to walk or sit up.  He described pain as piercing, not controlled with oral pain medications.  Patient has been taking his stool softeners and lactulose without any success. CTA chest negative for PE, moderate bilateral pleural effusions and associated atelectasis or consolidation.  CT abdomen and pelvis shows large stool burden, anasarca.  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 Multiple small fluid collections in the left posterior paraspinal phlegmon could represent tiny abscesses. Patient is started on IV antibiotics, infectious disease and neurosurgery consulted.  Neurosurgery consulted, Patient underwent T4 T5 laminectomy, left T5 pediculotomy for decompression of  spinal cord.   Assessment & Plan:   Principal Problem:   Osteomyelitis of thoracic spine (HCC) Active Problems:   Type 2 diabetes mellitus with diabetic polyneuropathy, without long-term current use of insulin (HCC)   Hyperlipidemia   GAD (generalized anxiety disorder)   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)   Essential hypertension   CKD (chronic kidney disease) stage 3, GFR 30-59 ml/min (HCC)   Pleural effusion, bilateral  MSSA bacteremia with osteomyelitis of thoracic spine: Patient diagnosed with MSSA bacteremia and with known osteomyelitis of his thoracic spine (biopsy  confirmed) He presented with worsening pain, found to have worsening of his osteomyelitis, T5 pathological fracture, new moderate to severe left eccentric canal stenosis at T5 with cord deformity. Neurosurgery consulted,  underwent T4 T5 laminectomy, left T5 pediculotomy for decompression of  spinal cord on 10/14. Adequate pain control with OxyContin 15 mg twice daily, and oxycodone 5 mg every 4 hours as needed for pain. Continue cefazolin as per infectious disease for MSSA. Follow-up OR cultures and blood cultures. TEE performed on 07/29/2021 at previous hospitalization.  Hiccups: Patient has developed hiccups postprocedure.  Could be due to acid reflux. Start baclofen, Protonix.  Monitor symptoms  Bilateral pleural effusions: CT chest negative for PE but shows bilateral pleural effusions with associated atelectasis or consolidation. Patient denies any cough, shortness of breath or fever, chills. No need for thoracocentesis at this time.  Constipation: Patient does not have a bowel movement since being on pain medication after discharge. Continue lactulose, senna, escalate as needed  Type 2 diabetes with neuropathy: Continue regular insulin sliding scale. Last hemoglobin A1c 7.5  Essential hypertension: Blood pressure fairly controlled. Continue amlodipine, hydralazine and metoprolol.  AKI on CKD stage IIIb: Serum creatinine was stable before procedure. Serum creatinine up from 1.47-> 2.48, could be due to inadequate hydration during surgery. Start gentle IV hydration NS at 75 cc/h Avoid nephrotoxic medications, continue to monitor serum creatinine  Hyperlipidemia: Continue statin  Generalized anxiety disorder: Continue Xanax  Diabetic ulcer of the left great toe: Seems stable, no signs of infection. Patient was seen by podiatry at last hospitalization Recommended daily wound care with mupirocin and dry sterile dressing Wound care consult  Pilonidal cyst: Unable to  examine because of back pain  COVID-19: Patient had positive test  in July,  more than 8 weeks ago, no longer needs airborne precautions.  Normochromic normocytic anemia. Likely anemia of chronic disease. Hemoglobin remained stable.   DVT prophylaxis: SCDs Code Status: Full code. Family Communication: No family at bed side. Disposition Plan:   Status is: Inpatient  Remains inpatient appropriate because:  requiring neurosurgery and debridement of abscess. Anticipated discharge: Home  Consultants:  Neurosurgery Infectious diseases  Procedures: Scheduled laminectomy, debridement of abscess Antimicrobials:   Anti-infectives (From admission, onward)    Start     Dose/Rate Route Frequency Ordered Stop   08/06/21 2200  ceFAZolin (ANCEF) IVPB 2g/100 mL premix        2 g 200 mL/hr over 30 Minutes Intravenous Every 8 hours 08/06/21 1829     08/06/21 1415  vancomycin (VANCOREADY) IVPB 1750 mg/350 mL  Status:  Discontinued        1,750 mg 175 mL/hr over 120 Minutes Intravenous Every 24 hours 08/06/21 1400 08/06/21 1829   08/06/21 1415  ceFEPIme (MAXIPIME) 2 g in sodium chloride 0.9 % 100 mL IVPB  Status:  Discontinued        2 g 200 mL/hr over 30 Minutes Intravenous Every 8 hours 08/06/21 1400 08/06/21 1829       Subjective: Patient was seen and examined at bedside.  Overnight events noted.   S/p T4-T5 laminectomy and debridement of abscess on 10/14 Patient reports being in a lot of pain, reports Dilaudid is causing nausea.   Patient want to have a stronger pain medication.  Objective: Vitals:   08/08/21 2048 08/08/21 2052 08/09/21 0502 08/09/21 0946  BP: 138/79  (!) 158/80 (!) 161/81  Pulse: (!) 120  (!) 123 (!) 121  Resp: (!) '24  18 19  '$ Temp: 98.2 F (36.8 C)  99 F (37.2 C) 98.1 F (36.7 C)  TempSrc: Oral  Oral Axillary  SpO2: 90% 92% 91% 92%  Weight:   89.3 kg   Height:        Intake/Output Summary (Last 24 hours) at 08/09/2021 1048 Last data filed at  08/09/2021 0600 Gross per 24 hour  Intake 450 ml  Output 0 ml  Net 450 ml   Filed Weights   08/06/21 0847 08/07/21 0628 08/09/21 0502  Weight: 86.2 kg 89.6 kg 89.3 kg    Examination:  General exam: Appears comfortable, not in any acute distress,  reports being in a lot of pain. respiratory system: Clear to auscultation bilaterally, RR 18. Cardiovascular system: S1-S2 heard, regular rate and rhythm, no murmur. Gastrointestinal system: Abdomen is soft, nontender, nondistended, BS + Central nervous system: Alert and oriented x 3. No focal neurological deficits. Extremities: No edema, partial erythematous wound in the left great big toe. Skin: No rashes, lesions or ulcers Psychiatry: Judgement and insight appear normal. Mood & affect appropriate.     Data Reviewed: I have personally reviewed following labs and imaging studies  CBC: Recent Labs  Lab 08/02/21 1421 08/06/21 0937 08/07/21 0447 08/07/21 1553 08/09/21 0311  WBC 16.9* 13.1* 10.0  --  24.8*  NEUTROABS  --  10.0*  --   --   --   HGB 7.8* 8.4* 7.8* 7.8* 8.1*  HCT 24.1* 26.1* 23.9* 23.0* 24.8*  MCV 86.4 85.9 84.2  --  86.7  PLT 385 518* 500*  --  [id number]*   Basic Metabolic Panel: Recent Labs  Lab 08/02/21 1421 08/03/21 0541 08/06/21 0937 08/07/21 0447 08/07/21 1553 08/09/21 0311  NA 135 134* 134* 133* 137 134*  K 4.9  4.8 4.6 4.2 4.8 4.7  CL 101 103 101 102  --  100  CO2 25 25 20* 24  --  20*  GLUCOSE 241* 212* 167* 275*  --  247*  BUN 39* 40* 21* 19  --  35*  CREATININE 2.32* 2.20* 1.50* 1.47*  --  2.48*  CALCIUM 9.2 9.2 9.4 9.0  --  8.5*  MG  --   --   --   --   --  2.0  PHOS  --   --   --   --   --  4.6   GFR: Estimated Creatinine Clearance: 39.2 mL/min (A) (by C-G formula based on SCr of 2.48 mg/dL (H)). Liver Function Tests: Recent Labs  Lab 08/06/21 0937 08/07/21 0447 08/09/21 0311  AST 17 13* 13*  ALT <5 <5 6  ALKPHOS 163* 145* 122  BILITOT 0.5 0.6 0.9  PROT 7.0 6.1* 6.1*  ALBUMIN 2.0*  1.7* 1.7*   Recent Labs  Lab 08/06/21 0937  LIPASE 21   No results for input(s): AMMONIA in the last 168 hours. Coagulation Profile: No results for input(s): INR, PROTIME in the last 168 hours. Cardiac Enzymes: No results for input(s): CKTOTAL, CKMB, CKMBINDEX, TROPONINI in the last 168 hours. BNP (last 3 results) No results for input(s): PROBNP in the last 8760 hours. HbA1C: No results for input(s): HGBA1C in the last 72 hours. CBG: Recent Labs  Lab 08/07/21 2039 08/08/21 0743 08/08/21 1118 08/08/21 1703 08/09/21 0635  GLUCAP 173* 191* 217* 204* 244*   Lipid Profile: No results for input(s): CHOL, HDL, LDLCALC, TRIG, CHOLHDL, LDLDIRECT in the last 72 hours. Thyroid Function Tests: No results for input(s): TSH, T4TOTAL, FREET4, T3FREE, THYROIDAB in the last 72 hours. Anemia Panel: No results for input(s): VITAMINB12, FOLATE, FERRITIN, TIBC, IRON, RETICCTPCT in the last 72 hours. Sepsis Labs: Recent Labs  Lab 08/06/21 [id number] 08/06/21 1828  LATICACIDVEN 0.6 0.5    Recent Results (from the past 240 hour(s))  Culture, blood (routine x 2)     Status: None (Preliminary result)   Collection Time: 08/06/21  9:10 AM   Specimen: BLOOD  Result Value Ref Range Status   Specimen Description BLOOD SITE NOT SPECIFIED  Final   Special Requests   Final    BOTTLES DRAWN AEROBIC AND ANAEROBIC Blood Culture results may not be optimal due to an inadequate volume of blood received in culture bottles   Culture   Final    NO GROWTH 3 DAYS Performed at [facility], [place] [address] [place], [place] [address]    Report Status PENDING  Incomplete  Resp Panel by RT-PCR (Flu A&B, Covid) Nasopharyngeal Swab     Status: Abnormal   Collection Time: 08/06/21  9:36 AM   Specimen: Nasopharyngeal Swab; Nasopharyngeal(NP) swabs in vial transport medium  Result Value Ref Range Status   SARS Coronavirus 2 by RT PCR POSITIVE (A) NEGATIVE Final    Comment: rn sarah b. [id number]  fcp (NOTE) SARS-CoV-2 target nucleic acids are DETECTED.  The SARS-CoV-2 RNA is generally detectable in upper respiratory specimens during the acute phase of infection. Positive results are indicative of the presence of the identified virus, but do not rule out bacterial infection or co-infection with other pathogens not detected by the test. Clinical correlation with patient history and other diagnostic information is necessary to determine patient infection status. The expected result is Negative.  Fact Sheet for Patients: EntrepreneurPulse.com.au  Fact Sheet for Healthcare Providers: IncredibleEmployment.be  This  test is not yet approved or cleared by the [place] and  has been authorized for detection and/or diagnosis of SARS-CoV-2 by FDA under an Emergency Use Authorization (EUA).  This EUA will remain in effect (meaning this test can be used) for the duration of  the COVID-19 declaration und er Section 564(b)(1) of the Act, 21 U.S.C. section 360bbb-3(b)(1), unless the authorization is terminated or revoked sooner.     Influenza A by PCR NEGATIVE NEGATIVE Final   Influenza B by PCR NEGATIVE NEGATIVE Final    Comment: (NOTE) The Xpert Xpress SARS-CoV-2/FLU/RSV plus assay is intended as an aid in the diagnosis of influenza from Nasopharyngeal swab specimens and should not be used as a sole basis for treatment. Nasal washings and aspirates are unacceptable for Xpert Xpress SARS-CoV-2/FLU/RSV testing.  Fact Sheet for Patients: EntrepreneurPulse.com.au  Fact Sheet for Healthcare Providers: IncredibleEmployment.be  This test is not yet approved or cleared by the [place] FDA and has been authorized for detection and/or diagnosis of SARS-CoV-2 by FDA under an Emergency Use Authorization (EUA). This EUA will remain in effect (meaning this test can be used) for the duration of the COVID-19  declaration under Section 564(b)(1) of the Act, 21 U.S.C. section 360bbb-3(b)(1), unless the authorization is terminated or revoked.  Performed at [facility], [place] [address], [place], [place] [address]   Culture, blood (routine x 2)     Status: None (Preliminary result)   Collection Time: 08/06/21  9:37 AM   Specimen: BLOOD RIGHT HAND  Result Value Ref Range Status   Specimen Description BLOOD RIGHT HAND  Final   Special Requests   Final    BOTTLES DRAWN AEROBIC AND ANAEROBIC Blood Culture adequate volume   Culture   Final    NO GROWTH 3 DAYS Performed at [facility], [place] [address] [place], [place] [address]    Report Status PENDING  Incomplete  Urine Culture     Status: None   Collection Time: 08/06/21  1:50 PM   Specimen: Urine, Clean Catch  Result Value Ref Range Status   Specimen Description URINE, CLEAN CATCH  Final   Special Requests NONE  Final   Culture   Final    NO GROWTH Performed at [facility], [place] [address], [place], [place] [address]    Report Status 08/07/2021 FINAL  Final  Surgical pcr screen     Status: None   Collection Time: 08/06/21  7:45 PM   Specimen: Nasal Mucosa; Nasal Swab  Result Value Ref Range Status   MRSA, PCR NEGATIVE NEGATIVE Final   Staphylococcus aureus NEGATIVE NEGATIVE Final    Comment: (NOTE) The Xpert SA Assay (FDA approved for NASAL specimens in patients 59 years of age and older), is one component of a comprehensive surveillance program. It is not intended to diagnose infection nor to guide or monitor treatment. Performed at [facility], [place] [address], [place],  [address]   Aerobic/Anaerobic Culture w Gram Stain (surgical/deep wound)     Status: None (Preliminary result)   Collection Time: 08/07/21  5:04 PM   Specimen: PATH Other; Tissue  Result Value Ref Range Status   Specimen Description ABSCESS  Final   Special Requests EPIDURAL SWABS PT ON ANCEF VANC MAXIPIME  Final   Gram Stain NO  WBC SEEN NO ORGANISMS SEEN   Final   Culture   Final    TOO YOUNG TO READ Performed at [facility],  [address] [place], [place] [address]    Report Status PENDING  Incomplete  Aerobic/Anaerobic Culture w Gram Stain (surgical/deep wound)     Status: None (Preliminary result)   Collection Time: 08/07/21  5:05 PM   Specimen: PATH Other; Tissue  Result Value Ref Range Status   Specimen Description TISSUE  Final   Special Requests EPIDURAL PT ON VANC MAXIPIME ANCEF  Final   Gram Stain NO WBC SEEN NO ORGANISMS SEEN   Final   Culture   Final    TOO YOUNG TO READ Performed at [facility], [address], [place], [place] [address]    Report Status PENDING  Incomplete    Radiology Studies: DG Thoracic Spine 2 View  Result Date: 08/07/2021 CLINICAL DATA:  T4-5 laminectomy/decompression with micro discectomy EXAM: THORACIC SPINE 2 VIEWS; DG C-ARM 1-60 MIN-NO REPORT COMPARISON:  CTA chest dated 08/06/2021 FLUOROSCOPY TIME:  19 seconds 9.8 mGy FINDINGS: Intraoperative fluoroscopic spot radiograph of the thoracic spine during T4-5 laminectomy/decompression. IMPRESSION: Intraoperative fluoroscopic images during T4-5 laminectomy/decompression. Electronically Signed   By: Julian Hy M.D.   On: 08/07/2021 20:22   [place] Intraoperative  Result Date: 08/07/2021 CLINICAL DATA:  Ultrasound was provided for use by the ordering physician.  No provider Interpretation or professional fees incurred.    DG C-Arm 1-60 Min-No Report  Result Date: 08/07/2021 Fluoroscopy was utilized by the requesting physician.  No radiographic interpretation.    Scheduled Meds:  (feeding supplement) PROSource Plus  30 mL Oral BID BM   amLODipine  10 mg Oral Daily   baclofen  10 mg Oral TID   Chlorhexidine Gluconate Cloth  6 each Topical Daily   feeding supplement  1 Container Oral TID BM   feeding supplement (GLUCERNA SHAKE)  237 mL Oral TID BM   gabapentin  300 mg Oral BID   guaiFENesin  600  mg Oral BID   hydrALAZINE  50 mg Oral TID   insulin aspart  0-9 Units Subcutaneous TID WC   lactulose  20 g Oral BID   multivitamin with minerals  1 tablet Oral Daily   mupirocin ointment  1 application Topical BID   oxyCODONE  15 mg Oral Q12H   pantoprazole (PROTONIX) IV  40 mg Intravenous Q12H   senna-docusate  1 tablet Oral BID   sodium chloride flush  10-40 mL Intracatheter Q12H   sodium chloride flush  3 mL Intravenous Q12H   traZODone  100 mg Oral QHS   Continuous Infusions:  sodium chloride     sodium chloride 75 mL/hr at 08/09/21 0612    ceFAZolin (ANCEF) IV 2 g (08/09/21 0804)   promethazine (PHENERGAN) injection (IM or IVPB) 25 mg (08/08/21 2255)     LOS: 3 days    Time spent: 25 mins    Quanta Robertshaw, MD [facility]   If 7PM-7AM, please contact night-coverage

## 2021-08-09 NOTE — Progress Notes (Signed)
Patient had 2 bouts of emesis. Appearance was brown/dark red. MD Dwyane Dee notified. New order for H&H and GI consult. Will continue to monitor.

## 2021-08-09 NOTE — Progress Notes (Signed)
   08/08/21 2115  Provider Notification  Provider Name/Title Dr. Marlowe Sax  Date Provider Notified 08/08/21  Time Provider Notified 2115  Notification Type Page  Notification Reason Change in status  Provider response See new orders   Upon assessment of patient, he continues to complain of uncontrolled left rib cage pain - sharp in nature.  He is also complaining of severe nausea.  Zofran and IV Dilaudid was given at 1822 without relief.  He does not feel that he can take his HS PO medications.  His HR remains in the 120s and respiratory rate is shallow and ranging from 22-28.  On 2 LPM [place], his O2 sat was 87-89%.  Increased O2 to 3 LPM [place] and O2 came up to 91-94%.  Dr. Marlowe Sax made aware of patient's status.  Order received to give 1 mg IV Dilaudid x 1.  Also, IV Phenergan given at 2255.  Pain and nausea still remains greater than 8/10.  Patient is still unable to take HS PO medications.  Dr. Marlowe Sax made aware.  No new orders received.  Will continue to monitor patient.  Earleen Reaper RN

## 2021-08-10 ENCOUNTER — Inpatient Hospital Stay (HOSPITAL_COMMUNITY): Payer: 59 | Admitting: Certified Registered"

## 2021-08-10 ENCOUNTER — Encounter (HOSPITAL_COMMUNITY)
Admission: EM | Disposition: A | Discharge status: Rehabilitation Facility | Payer: Self-pay | Source: Home / Self Care | Attending: Family Medicine

## 2021-08-10 ENCOUNTER — Inpatient Hospital Stay (HOSPITAL_COMMUNITY): Payer: 59

## 2021-08-10 ENCOUNTER — Encounter (HOSPITAL_COMMUNITY): Payer: Self-pay | Admitting: Family Medicine

## 2021-08-10 DIAGNOSIS — R6520 Severe sepsis without septic shock: Secondary | ICD-10-CM

## 2021-08-10 DIAGNOSIS — A419 Sepsis, unspecified organism: Secondary | ICD-10-CM

## 2021-08-10 DIAGNOSIS — N1832 Chronic kidney disease, stage 3b: Secondary | ICD-10-CM

## 2021-08-10 DIAGNOSIS — M4624 Osteomyelitis of vertebra, thoracic region: Secondary | ICD-10-CM | POA: Diagnosis not present

## 2021-08-10 DIAGNOSIS — J9601 Acute respiratory failure with hypoxia: Secondary | ICD-10-CM

## 2021-08-10 HISTORY — PX: ESOPHAGOGASTRODUODENOSCOPY (EGD) WITH PROPOFOL: SHX5813

## 2021-08-10 HISTORY — PX: BIOPSY: SHX5522

## 2021-08-10 LAB — GLUCOSE, CAPILLARY
Glucose-Capillary: 346 mg/dL — ABNORMAL HIGH (ref 70–99)
Glucose-Capillary: 350 mg/dL — ABNORMAL HIGH (ref 70–99)
Glucose-Capillary: 250 mg/dL — ABNORMAL HIGH (ref 70–99)
Glucose-Capillary: 245 mg/dL — ABNORMAL HIGH (ref 70–99)

## 2021-08-10 LAB — BLOOD GAS, ARTERIAL
Acid-base deficit: 4.1 mmol/L — ABNORMAL HIGH (ref 0.0–2.0)
Bicarbonate: 19.9 mmol/L — ABNORMAL LOW (ref 20.0–28.0)
Drawn by: 519031
FIO2: 100
O2 Saturation: 93.7 %
Patient temperature: 36.8
pCO2 arterial: 32.6 mmHg (ref 32.0–48.0)
pH, Arterial: 7.401 (ref 7.350–7.450)
pO2, Arterial: 69.8 mmHg — ABNORMAL LOW (ref 83.0–108.0)

## 2021-08-10 LAB — BASIC METABOLIC PANEL
Anion gap: 11 (ref 5–15)
BUN: 58 mg/dL — ABNORMAL HIGH (ref 6–20)
CO2: 21 mmol/L — ABNORMAL LOW (ref 22–32)
Calcium: 8.3 mg/dL — ABNORMAL LOW (ref 8.9–10.3)
Chloride: 103 mmol/L (ref 98–111)
Creatinine, Ser: 2.34 mg/dL — ABNORMAL HIGH (ref 0.61–1.24)
GFR, Estimated: 34 mL/min — ABNORMAL LOW (ref >60)
Glucose, Bld: 286 mg/dL — ABNORMAL HIGH (ref 70–99)
Potassium: 4.1 mmol/L (ref 3.5–5.1)
Sodium: 135 mmol/L (ref 135–145)

## 2021-08-10 LAB — PREPARE RBC (CROSSMATCH)

## 2021-08-10 LAB — CBC
HCT: 19.6 % — ABNORMAL LOW (ref 39.0–52.0)
Hemoglobin: 6.2 g/dL — CL (ref 13.0–17.0)
MCH: 27.8 pg (ref 26.0–34.0)
MCHC: 31.6 g/dL (ref 30.0–36.0)
MCV: 87.9 fL (ref 80.0–100.0)
Platelets: 522 10*3/uL — ABNORMAL HIGH (ref 150–400)
RBC: 2.23 MIL/uL — ABNORMAL LOW (ref 4.22–5.81)
RDW: 13.7 % (ref 11.5–15.5)
WBC: 23.2 10*3/uL — ABNORMAL HIGH (ref 4.0–10.5)
nRBC: 0 % (ref 0.0–0.2)

## 2021-08-10 LAB — PHOSPHORUS: Phosphorus: 3.2 mg/dL (ref 2.5–4.6)

## 2021-08-10 LAB — HEMOGLOBIN AND HEMATOCRIT, BLOOD
HCT: 21.7 % — ABNORMAL LOW (ref 39.0–52.0)
Hemoglobin: 7.1 g/dL — ABNORMAL LOW (ref 13.0–17.0)

## 2021-08-10 LAB — MAGNESIUM: Magnesium: 2 mg/dL (ref 1.7–2.4)

## 2021-08-10 SURGERY — ESOPHAGOGASTRODUODENOSCOPY (EGD) WITH PROPOFOL
Anesthesia: Monitor Anesthesia Care

## 2021-08-10 MED ORDER — VANCOMYCIN HCL 1250 MG/250ML IV SOLN: 1250.0000 mg | INTRAVENOUS | Status: DC

## 2021-08-10 MED ORDER — PROPOFOL 500 MG/50ML IV EMUL
INTRAVENOUS | Status: DC | PRN
  Administered 2021-08-10: 100 ug/kg/min via INTRAVENOUS

## 2021-08-10 MED ORDER — FENTANYL CITRATE (PF) 100 MCG/2ML IJ SOLN
25.0000 ug | INTRAMUSCULAR | Status: DC | PRN
  Administered 2021-08-10 (×2): 50 ug via INTRAVENOUS

## 2021-08-10 MED ORDER — FENTANYL CITRATE (PF) 100 MCG/2ML IJ SOLN
50.0000 ug | INTRAMUSCULAR | Status: DC | PRN
  Administered 2021-08-10 – 2021-08-11 (×4): 50 ug via INTRAVENOUS
  Filled 2021-08-10 (×4): qty 2

## 2021-08-10 MED ORDER — LIDOCAINE 2% (20 MG/ML) 5 ML SYRINGE
INTRAMUSCULAR | Status: DC | PRN
  Administered 2021-08-10: 100 mg via INTRAVENOUS

## 2021-08-10 MED ORDER — SODIUM CHLORIDE 0.9 % IV SOLN: INTRAVENOUS | Status: DC

## 2021-08-10 MED ORDER — FUROSEMIDE 10 MG/ML IJ SOLN
INTRAMUSCULAR | Status: AC
  Filled 2021-08-10: qty 4

## 2021-08-10 MED ORDER — SODIUM CHLORIDE 0.9% IV SOLUTION: Freq: Once | INTRAVENOUS | Status: DC

## 2021-08-10 MED ORDER — PIPERACILLIN-TAZOBACTAM 3.375 G IVPB
3.3750 g | Freq: Three times a day (TID) | INTRAVENOUS | Status: DC
  Administered 2021-08-10 – 2021-08-11 (×2): 3.375 g via INTRAVENOUS
  Filled 2021-08-10 (×3): qty 50

## 2021-08-10 MED ORDER — POLYETHYLENE GLYCOL 3350 17 G PO PACK
17.0000 g | PACK | Freq: Three times a day (TID) | ORAL | Status: DC
  Administered 2021-08-10: 17 g via ORAL
  Filled 2021-08-10 (×2): qty 1

## 2021-08-10 MED ORDER — AMISULPRIDE (ANTIEMETIC) 5 MG/2ML IV SOLN: 10.0000 mg | Freq: Once | INTRAVENOUS | Status: DC | PRN

## 2021-08-10 MED ORDER — INSULIN DETEMIR 100 UNIT/ML ~~LOC~~ SOLN
10.0000 [IU] | Freq: Every day | SUBCUTANEOUS | Status: DC
  Filled 2021-08-10: qty 0.1

## 2021-08-10 MED ORDER — FENTANYL CITRATE (PF) 100 MCG/2ML IJ SOLN
INTRAMUSCULAR | Status: AC
  Filled 2021-08-10: qty 2

## 2021-08-10 MED ORDER — VANCOMYCIN HCL 1750 MG/350ML IV SOLN
1750.0000 mg | Freq: Once | INTRAVENOUS | Status: AC
  Administered 2021-08-10: 1750 mg via INTRAVENOUS
  Filled 2021-08-10: qty 350

## 2021-08-10 MED ORDER — INSULIN DETEMIR 100 UNIT/ML ~~LOC~~ SOLN
15.0000 [IU] | Freq: Two times a day (BID) | SUBCUTANEOUS | Status: DC
  Administered 2021-08-10 – 2021-08-11 (×2): 15 [IU] via SUBCUTANEOUS
  Filled 2021-08-10 (×3): qty 0.15

## 2021-08-10 MED ORDER — PROPOFOL 10 MG/ML IV BOLUS
INTRAVENOUS | Status: DC | PRN
  Administered 2021-08-10: 20 mg via INTRAVENOUS
  Administered 2021-08-10: 30 mg via INTRAVENOUS
  Administered 2021-08-10: 20 mg via INTRAVENOUS
  Administered 2021-08-10 (×2): 30 mg via INTRAVENOUS

## 2021-08-10 MED ORDER — LIVING WELL WITH DIABETES BOOK
Freq: Once | Status: DC
  Filled 2021-08-10: qty 1

## 2021-08-10 MED ORDER — FUROSEMIDE 10 MG/ML IJ SOLN
40.0000 mg | Freq: Once | INTRAMUSCULAR | Status: AC
  Administered 2021-08-10: 40 mg via INTRAVENOUS

## 2021-08-10 SURGICAL SUPPLY — 15 items

## 2021-08-10 NOTE — Progress Notes (Signed)
Pharmacy Antibiotic Note  Jose [name] is a 45 y.o. male admitted on 08/06/2021 with  osteomyelitis .  Patient discharged last Saturday after being admitted for osteomyelitis and MSSA bacteremia. Returns to ED today with worsening back and shoulder pain and chills. PTA Ancef via PICC line.   Pt is s/p decompression of infection. OR culture is growing staph aureus again. Currently on cefazolin with ID on board.   Scr trended up to 2.34>>Crcl around 49 ml/min CBGs 200s - ok to add some levemir per Dr. Dwyane Dee  Plan: Cont cefazolin 2g q8h  Monitor renal function, cultures, and clinical progression Start Levemir 10units qday  Height: '5\' 11"'$  (180.3 cm) Weight: 89.3 kg (196 lb 13.9 oz) IBW/kg (Calculated) : 75.3  Temp (24hrs), Avg:98.9 F (37.2 C), Min:98.1 F (36.7 C), Max:99.9 F (37.7 C)  Recent Labs  Lab 08/06/21 0937 08/06/21 1828 08/07/21 0447 08/09/21 0311 08/10/21 0341  WBC 13.1*  --  10.0 24.8* 23.2*  CREATININE 1.50*  --  1.47* 2.48* 2.34*  LATICACIDVEN 0.6 0.5  --   --   --      Estimated Creatinine Clearance: 41.6 mL/min (A) (by C-G formula based on SCr of 2.34 mg/dL (H)).    Allergies  Allergen Reactions   Cranberry Itching    Antimicrobials this admission: Cefepime 10/13>>10/13 Vancomycin 10/13>>10/13 Cefazolin 10/13 >>    Microbiology results: 10/14 tissue cx: SA sens pending 10/13 blood: ngtd  10/13 urine: negative 10/13 COVID: positive, flu: negative 10/13: MRSA PCR: negative 9/28 BCx: 2/3 Staph aureus - no mecA > MSSA 9/30 rpt BCx: neg  - hx 7/25 COVID: positive  Onnie Boer, PharmD, BCIDP, AAHIVP, CPP Infectious Disease Pharmacist 08/10/2021 10:45 AM

## 2021-08-10 NOTE — Progress Notes (Signed)
  NEUROSURGERY PROGRESS NOTE   No issues overnight. Cont to c/o severe left-sided sternal pain. Appropriate back "soreness."  EXAM:  BP (!) 141/83 (BP Location: Left Arm)   Pulse (!) 128   Temp 98.1 F (36.7 C) (Oral)   Resp (!) 25   Ht '5\' 11"'$  (1.803 m)   Wt 89.3 kg   SpO2 92%   BMI 27.46 kg/m   Awake, alert, oriented  Speech fluent, appropriate  CN grossly intact  Moving BLE well Back wound c/d/i  IMPRESSION:  45 y.o. male s/p thoracic decompression for T4-5 osteodiscitis/SEA. Neurologically at baseline. - Anemia  PLAN: - Cont with efforts at pain mgmt - majority of pain seems to be from sternal lesion - OOB with PT/OT - Cont IV abx   Consuella Lose, MD [facility]

## 2021-08-10 NOTE — Consult Note (Signed)
Referring Provider:  [facility] Primary Care Physician:  [name], NP Primary Gastroenterologist:   Sadie Haber PCP   Reason for Consultation: GI bleed  HPI: Jose [name] is a 45 y.o. male with past medical history of diabetes complicated by retinopathy, neuropathy, chronic kidney disease presented to the hospital with severe back pain.  He was admitted to the hospital few weeks ago with a similar complaint and was found to have T4-T5 osteomyelitis/ Discitis and MSSA bacteremia.  He underwent IR guided biopsy during last admission which was negative for malignancy.  He was discharged home on antibiotics but came back to the hospital with worsening low back pain.  He underwent T4-T5 laminectomy as well as decompression of the spinal cord on August 07, 2021.   Patient was also complaining of constipation and abdominal distention during this admission.  Underwent CT abdomen pelvis with contrast on August 06, 2021 which showed normal-appearing liver, anasarca and constipation.  Patient developed coffee-ground emesis yesterday.  GI is consulted for further evaluation.  Patient's hemoglobin was in the range of 7-8 during this admission.  HgB dropped to 6.2 this morning.  Blood work yesterday showed normal LFTs.  Significantly low albumin at 1.7.  Patient seen and examined at bedside.  He reported 2 episodes of vomiting yesterday but he was not aware of coffee-ground substance in the vomiting.  No bowel movement last 2 days.  Complaining of abdominal distention and abdominal pain.  Denies NSAID use.  Denies alcohol use.  No previous EGD or colonoscopy.  Past Medical History:  Diagnosis Date   GAD (generalized anxiety disorder)    Hyperlipidemia    Macular degeneration, bilateral    Retinopathy    Type II diabetes mellitus with complication, uncontrolled    retinopathy, neuropathy, microalbuminuria    Past Surgical History:  Procedure Laterality Date   APPENDECTOMY     BUBBLE STUDY  07/29/2021    Procedure: BUBBLE STUDY;  Surgeon: Jerline Pain, MD;  Location: [facility];  Service: Cardiovascular;;   HERNIA REPAIR     IR FLUORO GUIDED NEEDLE PLC ASPIRATION/INJECTION LOC  07/28/2021   LUMBAR LAMINECTOMY/DECOMPRESSION MICRODISCECTOMY N/A 08/07/2021   Procedure: THORACIC FOUR - THORACIC FIVE LAMINECTOMY/DECOMPRESSION OF SPINAL CORD, DEBRIDEMENT OF ABSCESS, MICRODISCECTOMY, INTRAOPERATIVE ULTRASOUND;  Surgeon: Consuella Lose, MD;  Location: [facility];  Service: Neurosurgery;  Laterality: N/A;   TEE WITHOUT CARDIOVERSION N/A 07/29/2021   Procedure: TRANSESOPHAGEAL ECHOCARDIOGRAM (TEE);  Surgeon: Jerline Pain, MD;  Location: [facility] ENDOSCOPY;  Service: Cardiovascular;  Laterality: N/A;   TRIGGER FINGER RELEASE Right 10/25/2019   Procedure: RIGHT INDEX FINGER RELEASE TRIGGER FINGER/A-1 PULLEY;  Surgeon: Daryll Brod, MD;  Location: [place];  Service: Orthopedics;  Laterality: Right;  IV REGIONAL FOREARM BLOCK    Prior to Admission medications   Medication Sig Start Date End Date Taking? Authorizing Provider  acetaminophen (TYLENOL) 500 MG tablet Take 500 mg by mouth every 6 (six) hours as needed for mild pain, fever or headache.   Yes [provider]  albuterol (VENTOLIN HFA) 108 (90 Base) MCG/ACT inhaler Inhale 1 puff into the lungs every 4 (four) hours as needed for wheezing or shortness of breath. 05/27/21  Yes [provider]  ALPRAZolam (XANAX) 0.25 MG tablet Take 0.25 mg by mouth 2 (two) times daily as needed for anxiety. 06/12/21  Yes [provider]  amLODipine (NORVASC) 10 MG tablet Take 1 tablet (10 mg total) by mouth daily. 08/03/21  Yes Cristal Deer, MD  Ascorbic Acid (VITAMIN C  PO) Take 1 tablet by mouth daily.   Yes [provider]  atorvastatin (LIPITOR) 40 MG tablet Take 1 tablet (40 mg total) by mouth daily. 03/12/19  Yes Jacelyn Pi, Lilia Argue, MD  ceFAZolin (ANCEF) IVPB Inject 2 g into the vein every 8 (eight) hours.  Indication:  Osteomyelitis  First Dose: No Last Day of Therapy:  09/04/2022  Labs - Once weekly:  CBC/D and BMP, Labs - Every other week:  ESR and CRP Method of administration: IV Push Method of administration may be changed at the discretion of home infusion pharmacist based upon assessment of the patient and/or caregiver's ability to self-administer the medication ordered. 07/31/21  Yes Cristal Deer, MD  chlorproMAZINE (THORAZINE) 25 MG tablet Take 1 tablet (25 mg total) by mouth 3 (three) times daily as needed for hiccoughs. 08/03/21  Yes Cristal Deer, MD  collagenase (SANTYL) ointment Apply topically daily. 08/03/21  Yes Cristal Deer, MD  cyclobenzaprine (FLEXERIL) 10 MG tablet Take 1 tablet (10 mg total) by mouth 3 (three) times daily. 08/03/21  Yes Cristal Deer, MD  Dulaglutide (TRULICITY) 3 NT/7.0YF SOPN Inject 3 mg as directed once a week. Patient taking differently: Inject 3 mg as directed once a week. thursday 06/11/21  Yes Renato Shin, MD  feeding supplement, GLUCERNA SHAKE, (GLUCERNA SHAKE) LIQD Take 237 mLs by mouth 3 (three) times daily between meals. 08/03/21  Yes Cristal Deer, MD  gabapentin (NEURONTIN) 300 MG capsule TAKE 1 TO 2 CAPSULES(300 TO 600 MG) BY MOUTH AT BEDTIME Patient taking differently: Take 300 mg by mouth 2 (two) times daily. 12/01/19  Yes Jacelyn Pi, Lilia Argue, MD  glimepiride (AMARYL) 1 MG tablet Take 1 tablet (1 mg total) by mouth daily with breakfast. 01/08/21  Yes Renato Shin, MD  hydrALAZINE (APRESOLINE) 50 MG tablet Take 1 tablet (50 mg total) by mouth 3 (three) times daily. 08/03/21  Yes Cristal Deer, MD  lactulose (CHRONULAC) 10 GM/15ML solution Take 30 mLs (20 g total) by mouth 2 (two) times daily. 08/03/21  Yes Cristal Deer, MD  Multiple Vitamin (MULTIVITAMIN ADULT PO) Take 1 tablet by mouth daily.   Yes [provider]  mupirocin ointment (BACTROBAN) 2 % Apply 1 application topically 2 (two) times daily. 08/03/21  Yes  Cristal Deer, MD  Oxycodone HCl 10 MG TABS Take 10 mg by mouth every 3 (three) hours as needed for pain. 08/03/21  Yes [provider]  senna-docusate (SENOKOT-S) 8.6-50 MG tablet Take 1 tablet by mouth 2 (two) times daily. 08/03/21  Yes Cristal Deer, MD  tadalafil (CIALIS) 20 MG tablet TK 1 T PO  PO QD PRN FOR ERECTILE DYSFUNCTION Patient taking differently: Take 20 mg by mouth daily as needed for erectile dysfunction. 07/20/19  Yes Jacelyn Pi, Irma M, MD  tobramycin (TOBREX) 0.3 % ophthalmic solution Place 1 drop into both eyes See admin instructions. Begin 1 day prior to treatment and continue the day of treatment and for one full day after treatment. 07/05/18  Yes [provider]  traZODone (DESYREL) 100 MG tablet TAKE 1 TABLET(100 MG) BY MOUTH AT BEDTIME AS NEEDED FOR SLEEP Patient taking differently: Take 100 mg by mouth at bedtime. 03/05/21  Yes Ravi, Himabindu, MD  VITAMIN D PO Take 1 tablet by mouth daily.   Yes [provider]  DULoxetine (CYMBALTA) 60 MG capsule TAKE 1 CAPSULE(60 MG) BY MOUTH DAILY Patient not taking: No sig reported 03/05/21   Ravi, Himabindu, MD  Ranibizumab (LUCENTIS) 0.3 MG/0.05ML SOLN 1 Dose by  Intravitreal route every 3 (three) months.    [provider]    Scheduled Meds:  (feeding supplement) PROSource Plus  30 mL Oral BID BM   sodium chloride   Intravenous Once   amLODipine  10 mg Oral Daily   baclofen  10 mg Oral TID   Chlorhexidine Gluconate Cloth  6 each Topical Daily   feeding supplement  1 Container Oral TID BM   feeding supplement (GLUCERNA SHAKE)  237 mL Oral TID BM   gabapentin  300 mg Oral BID   guaiFENesin  600 mg Oral BID   hydrALAZINE  50 mg Oral TID   insulin aspart  0-9 Units Subcutaneous TID WC   lactulose  20 g Oral BID   multivitamin with minerals  1 tablet Oral Daily   mupirocin ointment  1 application Topical BID   oxyCODONE  15 mg Oral Q12H   pantoprazole (PROTONIX) IV  40 mg Intravenous  Q12H   senna-docusate  1 tablet Oral BID   sodium chloride flush  10-40 mL Intracatheter Q12H   sodium chloride flush  3 mL Intravenous Q12H   traZODone  100 mg Oral QHS   Continuous Infusions:  sodium chloride      ceFAZolin (ANCEF) IV 2 g (08/09/21 2244)   promethazine (PHENERGAN) injection (IM or IVPB) 25 mg (08/10/21 0506)   PRN Meds:.sodium chloride, acetaminophen **OR** acetaminophen, albuterol, ALPRAZolam, fentaNYL (SUBLIMAZE) injection, metoprolol tartrate, oxyCODONE, promethazine (PHENERGAN) injection (IM or IVPB), sodium chloride flush, sodium chloride flush, sodium phosphate  Allergies as of 08/06/2021 - Review Complete 08/06/2021  Allergen Reaction Noted   Cranberry Itching 08/06/2021    Family History  Problem Relation Age of Onset   Diabetes Mother    Hyperlipidemia Mother    Stroke Mother    Diabetes Father    Hyperlipidemia Brother    Stroke Brother    ADD / ADHD Brother    ADD / ADHD Son     Social History   Socioeconomic History   Marital status: Significant Other    Spouse name: Not on file   Number of children: 2   Years of education: Not on file   Highest education level: Bachelor's degree (e.g., BA, AB, BS)  Occupational History   Occupation: Multimedia programmer: [employer]  Tobacco Use   Smoking status: Former    Packs/day: 1.00    Years: 20.00    Pack years: 20.00    Types: Cigarettes    Quit date: 07/2020    Years since quitting: 1.0   Smokeless tobacco: Never  Vaping Use   Vaping Use: Never used  Substance and Sexual Activity   Alcohol use: Not Currently   Drug use: Never   Sexual activity: Yes    Partners: Female    Birth control/protection: None    Comment: with monogamous partner  Other Topics Concern   Not on file  Social History Narrative   Pt has lived 20 of life in [place]. Lives at home with partner, 2 kids, 8 cats, and 1 dog.    Social Determinants of Health   Financial Resource Strain:  Not on file  Food Insecurity: Not on file  Transportation Needs: Not on file  Physical Activity: Not on file  Stress: Not on file  Social Connections: Not on file  Intimate Partner Violence: Not on file    Review of Systems: 12 point review of system is done which is negative except as mentioned in HPI  Physical Exam: Vital signs: Vitals:   08/10/21 0731 08/10/21 0741  BP: (!) 141/83   Pulse: (!) 128   Resp:    Temp: 98.1 F (36.7 C)   SpO2: 90% 92%   Last BM Date: 08/08/21 Physical Exam Constitutional:      General: He is in acute distress.     Appearance: Normal appearance.  HENT:     Head: Normocephalic and atraumatic.     Nose: Nose normal.  Eyes:     Extraocular Movements: Extraocular movements intact.  Cardiovascular:     Rate and Rhythm: Normal rate and regular rhythm.     Heart sounds: Murmur heard.  Pulmonary:     Effort: Pulmonary effort is normal. No respiratory distress.  Abdominal:     General: Bowel sounds are normal. There is distension.     Tenderness: There is abdominal tenderness. There is no guarding.  Musculoskeletal:        General: No swelling or tenderness.     Cervical back: Normal range of motion.  Skin:    General: Skin is warm.     Coloration: Skin is not jaundiced.  Neurological:     Mental Status: He is alert and oriented to person, place, and time.  Psychiatric:        Mood and Affect: Mood normal.        Judgment: Judgment normal.     GI:  Lab Results: Recent Labs    08/09/21 0311 08/09/21 1438 08/10/21 0341  WBC 24.8*  --  23.2*  HGB 8.1* 7.9* 6.2*  HCT 24.8* 24.6* 19.6*  PLT 492*  --  522*   BMET Recent Labs    08/07/21 1553 08/09/21 0311 08/10/21 0341  NA 137 134* 135  K 4.8 4.7 4.1  CL  --  100 103  CO2  --  20* 21*  GLUCOSE  --  247* 286*  BUN  --  35* 58*  CREATININE  --  2.48* 2.34*  CALCIUM  --  8.5* 8.3*   LFT Recent Labs    08/09/21 0311  PROT 6.1*  ALBUMIN 1.7*  AST 13*  ALT 6  ALKPHOS  122  BILITOT 0.9   PT/INR No results for input(s): LABPROT, INR in the last 72 hours.   Studies/Results: No results found.  Impression/Plan: - Coffee-ground emesis - Acute Blood loss anemia -status post 1 unit of blood transfusion this morning. -T4-T5 osteomyelitis.S/P laminectomy as well as decompression of the spinal cord on August 07, 2021. -Constipation -most likely from narcotic use  Recommendations ------------------------- -Plan for EGD today. -Continue IV twice daily PPI for now -Increase MiraLAX to 3 times a day. -Discussed with hospitalist  Risks (bleeding, infection, bowel perforation that could require surgery, sedation-related changes in cardiopulmonary systems), benefits (identification and possible treatment of source of symptoms, exclusion of certain causes of symptoms), and alternatives (watchful waiting, radiographic imaging studies, empiric medical treatment)  were explained to patient/family in detail and patient wishes to proceed.      LOS: 4 days   Otis Brace  MD, FACP 08/10/2021, 8:12 AM  Contact #  [phone]

## 2021-08-10 NOTE — Progress Notes (Addendum)
   08/10/21 1520  Assess: MEWS Score  BP (!) 163/80  Pulse Rate (!) 124  ECG Heart Rate (!) 129  Resp (!) 31  Level of Consciousness Alert  SpO2 (!) 86 %  O2 Device Nasal Cannula  Patient Activity (if Appropriate) In bed  O2 Flow Rate (L/min) 11 L/min  Assess: MEWS Score  MEWS Temp 0  MEWS Systolic 0  MEWS Pulse 2  MEWS RR 2  MEWS LOC 0  MEWS Score 4  MEWS Score Color Red  Assess: if the MEWS score is Yellow or Red  Were vital signs taken at a resting state? Yes  Provider and Rapid Response Notified? Yes  Early Detection of Sepsis Score *See Row Information* High  MEWS guidelines implemented *See Row Information* Yes  Treat  Pain Scale 0-10  Pain Score 9  Notify: Charge Nurse/RN  Name of Charge Nurse/RN Notified April  Date Charge Nurse/RN Notified 08/10/21  Time Charge Nurse/RN Notified 1510  Notify: Provider  Provider Name/Title Dr Dwyane Dee  Date Provider Notified 08/10/21  Time Provider Notified 1513  Notification Type Page  Notification Reason Other (Comment) (increasing oxygen demands)  Provider response Other (Comment) (consult to CCM)  Date of Provider Response 08/10/21  Time of Provider Response 1515  Notify: Rapid Response  Name of Rapid Response RN Notified Wilburn Cornelia RN  Date Rapid Response Notified 08/10/21  Time Rapid Response Notified [id number]  Patient with uncontrolled pain despite ordered pain meds, RN increasing supplemental oxygen with little change in o2 saturations, placed on NRB. Rapid Response and CCM to bedside, new orders placed, pt to be transferred to ICU. Waiting on bed assignment.

## 2021-08-10 NOTE — Consult Note (Signed)
NAME:  Jose [name], MRN:  [id number], DOB:  1976/01/13, LOS: 4 ADMISSION DATE:  08/06/2021, CONSULTATION DATE: 08/10/2021 REFERRING MD: [facility], CHIEF COMPLAINT: Acute hypoxia  History of Present Illness:  45 year old male with a past medical history significant for anxiety disorder, COVID approximately 1 month prior to admission, type 2 diabetes who underwent a T4-5 laminectomy, left T5 pediculotomy, and decompression of spinal cord.  He developed bloody stools and had a EGD who performed which demonstrated bleeding was found entire esophagus.  Biopsies were taken for histology.  He is been placed on twice daily proton pump inhibitors and GI has signed off.  On 1017 he developed increasing respiratory distress, tachycardia increasing oxygen demands and generalized distress.  Pulmonary critical care was called to the bedside we will transfer him to the intensive care unit get a stat portable chest x-ray along with arterial blood gas.  We will attempt to control his pain issues but he may require intubation due to his worsening hypoxia.  Pertinent  Medical History   Past Medical History:  Diagnosis Date   GAD (generalized anxiety disorder)    Hyperlipidemia    Macular degeneration, bilateral    Retinopathy    Type II diabetes mellitus with complication, uncontrolled    retinopathy, neuropathy, microalbuminuria     Significant Hospital Events: Including procedures, antibiotic start and stop dates in addition to other pertinent events   08/07/2021 245 osteomyelitis, T4-5 discitis, T5 fracture, spinal cord compression status postlaminectomy compression and spinal cord 08/10/2021 EGD for GI bleed 08/10/2021 transfusion hemoglobin 7.1  Interim History / Subjective:  Worsening hypoxia over the last 24 hours  Objective   Blood pressure (!) 163/80, pulse (!) 124, temperature 98.3 F (36.8 C), resp. rate (!) 31, height 5' 11"  (1.803 m), weight 89.3 kg, SpO2 (!) 86 %.        Intake/Output  Summary (Last 24 hours) at 08/10/2021 1554 Last data filed at 08/10/2021 1526 Gross per 24 hour  Intake 2490.07 ml  Output 1850 ml  Net 640.07 ml   Filed Weights   08/06/21 0847 08/07/21 0628 08/09/21 0502  Weight: 86.2 kg 89.6 kg 89.3 kg    Examination: General: In obvious distress with increased work of breathing and desaturations HENT: No JVD or lymphadenopathy is appreciated Lungs: Left basilar crackles Cardiovascular: Sinus tach 130 Abdomen: Bowel sounds present Extremities: Warm and dry Neuro: Fully intact without focal defect GU: Amber urine Thoracic wound dressing    Resolved Hospital Problem list     Assessment & Plan:  Acute acute hypoxic respiratory failure in the setting of a recent GI bleed when EGD performed, recent spinal surgery performed by neurosurgery with a decompression of T4-5 osteodiscitis with extensive pain that is impeding his ability to take deep breath.  Note he had COVID approximately 1 month ago Attempt to achieve pain control with Dilaudid with the realization this could lead to respiratory distress or worsening Transfer to intensive care unit where he can be monitored close O2 to keep sats greater than 92% currently on a nonrebreather Pulmonary toilet as able He may require intubation provide adequate ventilation and oxygenation X-ray stat of chest Arterial blood gas stat  Status post thoracic laminectomy Per neurosurgery   Osteomyelitis/MSSA bacteremia Per ID   Diabetes mellitus CBG (last 3)  Recent Labs    08/09/21 1627 08/09/21 2109 08/10/21 1035  GLUCAP 258* 251* 346*   On sliding scale insulin protocol   Anxiety disorder this may be exacerbating his pain Anxiolytics  Best Practice (right click and "Reselect all SmartList Selections" daily)   Diet/type: Regular consistency (see orders) DVT prophylaxis: not indicated GI prophylaxis: PPI Lines: Central line Foley:  Yes, and it is still needed Code Status:  full  code Last date of multidisciplinary goals of care discussion [TBD]  Labs   CBC: Recent Labs  Lab 08/06/21 0937 08/07/21 0447 08/07/21 1553 08/09/21 0311 08/09/21 1438 08/10/21 0341 08/10/21 1510  WBC 13.1* 10.0  --  24.8*  --  23.2*  --   NEUTROABS 10.0*  --   --   --   --   --   --   HGB 8.4* 7.8* 7.8* 8.1* 7.9* 6.2* 7.1*  HCT 26.1* 23.9* 23.0* 24.8* 24.6* 19.6* 21.7*  MCV 85.9 84.2  --  86.7  --  87.9  --   PLT 518* 500*  --  492*  --  522*  --     Basic Metabolic Panel: Recent Labs  Lab 08/06/21 0937 08/07/21 0447 08/07/21 1553 08/09/21 0311 08/10/21 0341  NA 134* 133* 137 134* 135  K 4.6 4.2 4.8 4.7 4.1  CL 101 102  --  100 103  CO2 20* 24  --  20* 21*  GLUCOSE 167* 275*  --  247* 286*  BUN 21* 19  --  35* 58*  CREATININE 1.50* 1.47*  --  2.48* 2.34*  CALCIUM 9.4 9.0  --  8.5* 8.3*  MG  --   --   --  2.0 2.0  PHOS  --   --   --  4.6 3.2   GFR: Estimated Creatinine Clearance: 41.6 mL/min (A) (by C-G formula based on SCr of 2.34 mg/dL (H)). Recent Labs  Lab 08/06/21 0937 08/06/21 1828 08/07/21 0447 08/09/21 0311 08/10/21 0341  WBC 13.1*  --  10.0 24.8* 23.2*  LATICACIDVEN 0.6 0.5  --   --   --     Liver Function Tests: Recent Labs  Lab 08/06/21 0937 08/07/21 0447 08/09/21 0311  AST 17 13* 13*  ALT <5 <5 6  ALKPHOS 163* 145* 122  BILITOT 0.5 0.6 0.9  PROT 7.0 6.1* 6.1*  ALBUMIN 2.0* 1.7* 1.7*   Recent Labs  Lab 08/06/21 0937  LIPASE 21   No results for input(s): AMMONIA in the last 168 hours.  ABG    Component Value Date/Time   HCO3 26.6 08/07/2021 1553   TCO2 28 08/07/2021 1553   O2SAT 98.0 08/07/2021 1553     Coagulation Profile: No results for input(s): INR, PROTIME in the last 168 hours.  Cardiac Enzymes: No results for input(s): CKTOTAL, CKMB, CKMBINDEX, TROPONINI in the last 168 hours.  HbA1C: Hemoglobin A1C  Date/Time Value Ref Range Status  06/11/2021 07:49 AM 7.5 (A) 4.0 - 5.6 % Final  03/10/2021 09:39 AM 7.0 (A)  4.0 - 5.6 % Final   Hgb A1c MFr Bld  Date/Time Value Ref Range Status  05/19/2021 04:31 AM 7.7 (H) 4.8 - 5.6 % Final    Comment:    (NOTE)         Prediabetes: 5.7 - 6.4         Diabetes: >6.4         Glycemic control for adults with diabetes: <7.0   11/22/2019 10:12 AM 7.2 (H) 4.8 - 5.6 % Final    Comment:             Prediabetes: 5.7 - 6.4          Diabetes: >6.4  Glycemic control for adults with diabetes: <7.0     CBG: Recent Labs  Lab 08/09/21 0635 08/09/21 1100 08/09/21 1627 08/09/21 2109 08/10/21 1035  GLUCAP 244* 264* 258* 251* 346*    Review of Systems:   12 point review of system taken, please see HPI for positives and negatives.   Past Medical History:  He,  has a past medical history of GAD (generalized anxiety disorder), Hyperlipidemia, Macular degeneration, bilateral, Retinopathy, and Type II diabetes mellitus with complication, uncontrolled.   Surgical History:   Past Surgical History:  Procedure Laterality Date   APPENDECTOMY     BUBBLE STUDY  07/29/2021   Procedure: BUBBLE STUDY;  Surgeon: Jerline Pain, MD;  Location: [place];  Service: Cardiovascular;;   HERNIA REPAIR     IR FLUORO GUIDED NEEDLE PLC ASPIRATION/INJECTION LOC  07/28/2021   LUMBAR LAMINECTOMY/DECOMPRESSION MICRODISCECTOMY N/A 08/07/2021   Procedure: THORACIC FOUR - THORACIC FIVE LAMINECTOMY/DECOMPRESSION OF SPINAL CORD, DEBRIDEMENT OF ABSCESS, MICRODISCECTOMY, INTRAOPERATIVE ULTRASOUND;  Surgeon: Consuella Lose, MD;  Location: [place];  Service: Neurosurgery;  Laterality: N/A;   TEE WITHOUT CARDIOVERSION N/A 07/29/2021   Procedure: TRANSESOPHAGEAL ECHOCARDIOGRAM (TEE);  Surgeon: Jerline Pain, MD;  Location: [facility] ENDOSCOPY;  Service: Cardiovascular;  Laterality: N/A;   TRIGGER FINGER RELEASE Right 10/25/2019   Procedure: RIGHT INDEX FINGER RELEASE TRIGGER FINGER/A-1 PULLEY;  Surgeon: Daryll Brod, MD;  Location: [place];  Service: Orthopedics;  Laterality:  Right;  IV REGIONAL FOREARM BLOCK     Social History:   reports that he quit smoking about 12 months ago. His smoking use included cigarettes. He has a 20.00 pack-year smoking history. He has never used smokeless tobacco. He reports that he does not currently use alcohol. He reports that he does not use drugs.   Family History:  His family history includes ADD / ADHD in his brother and son; Diabetes in his father and mother; Hyperlipidemia in his brother and mother; Stroke in his brother and mother.   Allergies Allergies  Allergen Reactions   Cranberry Itching     Home Medications  Prior to Admission medications   Medication Sig Start Date End Date Taking? Authorizing Provider  acetaminophen (TYLENOL) 500 MG tablet Take 500 mg by mouth every 6 (six) hours as needed for mild pain, fever or headache.   Yes [provider]  albuterol (VENTOLIN HFA) 108 (90 Base) MCG/ACT inhaler Inhale 1 puff into the lungs every 4 (four) hours as needed for wheezing or shortness of breath. 05/27/21  Yes [provider]  ALPRAZolam (XANAX) 0.25 MG tablet Take 0.25 mg by mouth 2 (two) times daily as needed for anxiety. 06/12/21  Yes [provider]  amLODipine (NORVASC) 10 MG tablet Take 1 tablet (10 mg total) by mouth daily. 08/03/21  Yes Cristal Deer, MD  Ascorbic Acid (VITAMIN C PO) Take 1 tablet by mouth daily.   Yes [provider]  atorvastatin (LIPITOR) 40 MG tablet Take 1 tablet (40 mg total) by mouth daily. 03/12/19  Yes Jacelyn Pi, Lilia Argue, MD  ceFAZolin (ANCEF) IVPB Inject 2 g into the vein every 8 (eight) hours. Indication:  Osteomyelitis  First Dose: No Last Day of Therapy:  09/04/2022  Labs - Once weekly:  CBC/D and BMP, Labs - Every other week:  ESR and CRP Method of administration: IV Push Method of administration may be changed at the discretion of home infusion pharmacist based upon assessment of the patient and/or caregiver's ability to self-administer  the medication ordered.  07/31/21  Yes Cristal Deer, MD  chlorproMAZINE (THORAZINE) 25 MG tablet Take 1 tablet (25 mg total) by mouth 3 (three) times daily as needed for hiccoughs. 08/03/21  Yes Cristal Deer, MD  collagenase (SANTYL) ointment Apply topically daily. 08/03/21  Yes Cristal Deer, MD  cyclobenzaprine (FLEXERIL) 10 MG tablet Take 1 tablet (10 mg total) by mouth 3 (three) times daily. 08/03/21  Yes Cristal Deer, MD  Dulaglutide (TRULICITY) 3 LN/7.9JK SOPN Inject 3 mg as directed once a week. Patient taking differently: Inject 3 mg as directed once a week. thursday 06/11/21  Yes Renato Shin, MD  feeding supplement, GLUCERNA SHAKE, (GLUCERNA SHAKE) LIQD Take 237 mLs by mouth 3 (three) times daily between meals. 08/03/21  Yes Cristal Deer, MD  gabapentin (NEURONTIN) 300 MG capsule TAKE 1 TO 2 CAPSULES(300 TO 600 MG) BY MOUTH AT BEDTIME Patient taking differently: Take 300 mg by mouth 2 (two) times daily. 12/01/19  Yes Jacelyn Pi, Lilia Argue, MD  glimepiride (AMARYL) 1 MG tablet Take 1 tablet (1 mg total) by mouth daily with breakfast. 01/08/21  Yes Renato Shin, MD  hydrALAZINE (APRESOLINE) 50 MG tablet Take 1 tablet (50 mg total) by mouth 3 (three) times daily. 08/03/21  Yes Cristal Deer, MD  lactulose (CHRONULAC) 10 GM/15ML solution Take 30 mLs (20 g total) by mouth 2 (two) times daily. 08/03/21  Yes Cristal Deer, MD  Multiple Vitamin (MULTIVITAMIN ADULT PO) Take 1 tablet by mouth daily.   Yes [provider]  mupirocin ointment (BACTROBAN) 2 % Apply 1 application topically 2 (two) times daily. 08/03/21  Yes Cristal Deer, MD  Oxycodone HCl 10 MG TABS Take 10 mg by mouth every 3 (three) hours as needed for pain. 08/03/21  Yes [provider]  senna-docusate (SENOKOT-S) 8.6-50 MG tablet Take 1 tablet by mouth 2 (two) times daily. 08/03/21  Yes Cristal Deer, MD  tadalafil (CIALIS) 20 MG tablet TK 1 T PO  PO QD PRN FOR ERECTILE  DYSFUNCTION Patient taking differently: Take 20 mg by mouth daily as needed for erectile dysfunction. 07/20/19  Yes Jacelyn Pi, Irma M, MD  tobramycin (TOBREX) 0.3 % ophthalmic solution Place 1 drop into both eyes See admin instructions. Begin 1 day prior to treatment and continue the day of treatment and for one full day after treatment. 07/05/18  Yes [provider]  traZODone (DESYREL) 100 MG tablet TAKE 1 TABLET(100 MG) BY MOUTH AT BEDTIME AS NEEDED FOR SLEEP Patient taking differently: Take 100 mg by mouth at bedtime. 03/05/21  Yes Ravi, Himabindu, MD  VITAMIN D PO Take 1 tablet by mouth daily.   Yes [provider]  DULoxetine (CYMBALTA) 60 MG capsule TAKE 1 CAPSULE(60 MG) BY MOUTH DAILY Patient not taking: No sig reported 03/05/21   Einar Grad, Himabindu, MD  Ranibizumab (LUCENTIS) 0.3 MG/0.05ML SOLN 1 Dose by Intravitreal route every 3 (three) months.    [provider]     Critical care time: 45 mins    Richardson Landry Tawonda Legaspi ACNP Acute Care Nurse Practitioner [place] Please consult Amion 08/10/2021, 3:54 PM

## 2021-08-10 NOTE — Op Note (Signed)
[facility] Patient Name: Jose [name] Procedure Date : 08/10/2021 MRN: [id number] Attending MD: Otis Brace , MD Date of Birth: [birth year] CSN: [id number] Age: 45 Admit Type: Inpatient Procedure:                Upper GI endoscopy Indications:              Coffee-ground emesis Providers:                Otis Brace, MD, Doristine Johns, RN,                            Fransico Setters Mbumina, Technician Referring MD:              Medicines:                Sedation Administered by an Anesthesia Professional Complications:            No immediate complications. Estimated Blood Loss:     Estimated blood loss was minimal. Procedure:                Pre-Anesthesia Assessment:                           - Prior to the procedure, a History and Physical                            was performed, and patient medications and                            allergies were reviewed. The patient's tolerance of                            previous anesthesia was also reviewed. The risks                            and benefits of the procedure and the sedation                            options and risks were discussed with the patient.                            All questions were answered, and informed consent                            was obtained. Prior Anticoagulants: The patient has                            taken no previous anticoagulant or antiplatelet                            agents. ASA Grade Assessment: III - A patient with                            severe systemic disease. After reviewing the risks  and benefits, the patient was deemed in                            satisfactory condition to undergo the procedure.                           After obtaining informed consent, the endoscope was                            passed under direct vision. Throughout the                            procedure, the patient's blood pressure, pulse, and                             oxygen saturations were monitored continuously. The                            [id number] Olympus endoscope was introduced                            through the mouth, and advanced to the second part                            of duodenum. The upper GI endoscopy was performed                            with difficulty due to presence of food. The                            patient tolerated the procedure well. Scope In: Scope Out: Findings:      LA Grade D (one or more mucosal breaks involving at least 75% of       esophageal circumference) esophagitis with no bleeding was found in the       entire esophagus. Biopsies were taken with a cold forceps for histology.      Hematin (altered blood/coffee-ground-like material) was found in the       gastric body.      No gross lesions were noted in the entire examined stomach. Biopsies       were taken with a cold forceps for histology.      Few non-bleeding superficial duodenal ulcers with no stigmata of       bleeding were found in the duodenal bulb. The largest lesion was 15 mm       in largest dimension. Biopsies were taken with a cold forceps for       histology.      The second portion of the duodenum was normal. Impression:               - LA Grade D esophagitis with no bleeding. Biopsied.                           - Hematin (altered blood/coffee-ground-like  material) in the gastric body.                           - No gross lesions in the stomach. Biopsied.                           - Non-bleeding duodenal ulcers with no stigmata of                            bleeding. Biopsied.                           - Normal second portion of the duodenum. Recommendation:           - Return patient to hospital ward for ongoing care.                           - Soft diet.                           - Continue present medications.                           - Await pathology results.                            - Repeat upper endoscopy in 3 months to check                            healing.                           - Use Protonix (pantoprazole) 40 mg PO BID. Procedure Code(s):        --- Professional ---                           [phone], Esophagogastroduodenoscopy, flexible,                            transoral; with biopsy, single or multiple Diagnosis Code(s):        --- Professional ---                           K20.90, Esophagitis, unspecified without bleeding                           K92.2, Gastrointestinal hemorrhage, unspecified                           K26.9, Duodenal ulcer, unspecified as acute or                            chronic, without hemorrhage or perforation                           K92.0, Hematemesis CPT copyright 2019 American Medical Association. All rights reserved. The codes documented in this report are preliminary and  upon coder review may  be revised to meet current compliance requirements. Otis Brace, MD Otis Brace, MD 08/10/2021 11:50:52 AM Number of Addenda: 0

## 2021-08-10 NOTE — Progress Notes (Signed)
Pharmacy Antibiotic Note  Jose [name] is a 45 y.o. male admitted on 08/06/2021 with worsening back pain, S/P T4-5 laminectomy, LT5 pediculotomy, and decompression of spinal cord on 10/14.  Pt now with acute hypoxic respiratory failure in setting of recent GI bleed and recent spinal surgery with extensive pain that is impeding his ability to take deep breath. Pt also had COVID ~1 month ago. Pt also with MSSA bacteremia, osteomyelitis (followed by ID; on cefazolin from 10/13-10/17). Pharmacy has been consulted for vancomycin and Zosyn dosing for pneumonia.  WBC 23.2, Tmax 99.9 F; Scr 2.34, CrCl 41.6 ml/min  Plan: Zosyn 3.375 gm IV Q 8 hrs (extended infusion) Vancomycin 1750 mg IV X 1, followed by vancomycin 1250 mg IV Q 24  hrs  (estimated vancomycin AUC, using Scr 2.34, is 500; goal AUC is 400-55) Monitor WBC, temp, clinical course, cultures, renal function, vancomycin levels   Height: '5\' 11"'$  (180.3 cm) Weight: 89.3 kg (196 lb 13.9 oz) IBW/kg (Calculated) : 75.3  Temp (24hrs), Avg:98.7 F (37.1 C), Min:97.8 F (36.6 C), Max:99.9 F (37.7 C)  Recent Labs  Lab 08/06/21 0937 08/06/21 1828 08/07/21 0447 08/09/21 0311 08/10/21 0341  WBC 13.1*  --  10.0 24.8* 23.2*  CREATININE 1.50*  --  1.47* 2.48* 2.34*  LATICACIDVEN 0.6 0.5  --   --   --     Estimated Creatinine Clearance: 41.6 mL/min (A) (by C-G formula based on SCr of 2.34 mg/dL (H)).    Allergies  Allergen Reactions   Cranberry Itching    Antimicrobials this admission: Cefazolin 10/13-10/17 Vancomycin 10/17 >> Zosyn 10/17 >>  Microbiology results: 10/13 COVID: positive 10/13 Flu A, flu B: negative 10/13 Bld cx X 2: NG at 4 days 10/13 Urine cx: NG/final 10/13 MRSA PCR: negative 10/13 Staph aureus nasal swab: negative 10/14 Epidural abscess cx:  MSSA 10/14 Epidural cx: MSSA  Thank you for allowing pharmacy to be a part of this patient's care.  Gillermina Hu, PharmD, BCPS, [facility] Clinical Pharmacist 08/10/2021  4:35 PM

## 2021-08-10 NOTE — Progress Notes (Addendum)
[facility] Progress Note Patient Name: MARION SOLLECITO DOB: 17-Aug-1976 MRN: [id number]   Date of Service  08/10/2021  HPI/Events of Note  Notified of increased confusion.   Pt s/p T4-5 laminectomy, left T5 pediculotomy, and decompression of spinal cord. He has MSSA bacteremia and had GI bleed s/p endoscopy earlier today. He got propofol and fentanyl for the procedure.   Physical exam is non-focal but speech is repetitive and patient seems more confused than baseline.   eICU Interventions  Get CT head.      Intervention Category Intermediate Interventions: Change in mental status - evaluation and management  Elsie Lincoln 08/10/2021, 8:49 PM  10:36 PM Received query from bedside regarding oral medications given EGD findings of esophagitis and retained coffee grounds in the stomach.   Plan> Please continue lactulose and miralax.   2:58 AM CT head with no acute intracranial process.  RN reporting increasing O2 requirements.   Plan> Get CXR.  3:51 AM CXR unchanged.  Hgb now at 6.6 <-- 7.1.   Plan> Transfuse 1 unit pRBC.

## 2021-08-10 NOTE — Progress Notes (Signed)
RT NOTES: ABG obtained and sent to lab. Lab tech notified.  

## 2021-08-10 NOTE — Transfer of Care (Signed)
Immediate Anesthesia Transfer of Care Note  Patient: Jose [name]  Procedure(s) Performed: ESOPHAGOGASTRODUODENOSCOPY (EGD) WITH PROPOFOL BIOPSY  Patient Location: PACU  Anesthesia Type:MAC  Level of Consciousness: drowsy and patient cooperative  Airway & Oxygen Therapy: Patient Spontanous Breathing and Patient connected to nasal cannula oxygen  Post-op Assessment: Report given to RN, Post -op Vital signs reviewed and stable and Patient moving all extremities  Post vital signs: Reviewed and stable  Last Vitals:  Vitals Value Taken Time  BP    Temp    Pulse 120 08/10/21 1156  Resp 22 08/10/21 1156  SpO2 90 % 08/10/21 1156  Vitals shown include unvalidated device data.  Last Pain:  Vitals:   08/10/21 1110  TempSrc: Temporal  PainSc: 8       Patients Stated Pain Goal: 0 (81/82/99 3716)  Complications: No notable events documented.

## 2021-08-10 NOTE — Progress Notes (Signed)
Inpatient Diabetes Program Recommendations  AACE/ADA: New Consensus Statement on Inpatient Glycemic Control (2015)  Target Ranges:  Prepandial:   less than 140 mg/dL      Peak postprandial:   less than 180 mg/dL (1-2 hours)      Critically ill patients:  140 - 180 mg/dL   Lab Results  Component Value Date   GLUCAP 346 (H) 08/10/2021   HGBA1C 7.5 (A) 06/11/2021    Review of Glycemic Control Results for Jose [name], Jose [name] (MRN [id number]) as of 08/10/2021 12:37  Ref. Range 08/09/2021 06:35 08/09/2021 11:00 08/09/2021 16:27 08/09/2021 21:09 08/10/2021 10:35  Glucose-Capillary Latest Ref Range: 70 - 99 mg/dL 244 (H) 264 (H) 258 (H) 251 (H) 346 (H)   Diabetes history: DM2 Outpatient Diabetes medications: Amaryl 1 mg qd, Trulicity 3 mg q week Current orders for Inpatient glycemic control: Levemir 10 units, Novolog 0-9 units tid  Inpatient Diabetes Program Recommendations:   Received consult regarding diabetes management. Agree with adding Levemir 10 units qd Patient currently having upper GI. May need Novolog meal coverage when taking po.  Thank you, Nani Gasser. Eugean Arnott, RN, MSN, CDE  Diabetes Coordinator Inpatient Glycemic Control Team Team Pager [phone] (8am-5pm) 08/10/2021 12:47 PM

## 2021-08-10 NOTE — Anesthesia Preprocedure Evaluation (Signed)
Anesthesia Evaluation  Patient identified by MRN, date of birth, ID band Patient awake    Reviewed: Allergy & Precautions, NPO status , Patient's Chart, lab work & pertinent test results  Airway Mallampati: II  TM Distance: >3 FB Neck ROM: Full    Dental   Pulmonary former smoker,  Bilateral pleural effusions. On 6L [place]   breath sounds clear to auscultation       Cardiovascular hypertension,  Rhythm:Regular Rate:Tachycardia     Neuro/Psych  Neuromuscular disease    GI/Hepatic negative GI ROS, (+) Cirrhosis       ,   Endo/Other  diabetes  Renal/GU Renal disease     Musculoskeletal   Abdominal   Peds  Hematology   Anesthesia Other Findings   Reproductive/Obstetrics                             Anesthesia Physical Anesthesia Plan  ASA: 4  Anesthesia Plan: MAC   Post-op Pain Management:    Induction:   PONV Risk Score and Plan: 1 and Propofol infusion and Treatment may vary due to age or medical condition  Airway Management Planned: Natural Airway, Nasal Cannula and Simple Face Mask  Additional Equipment:   Intra-op Plan:   Post-operative Plan:   Informed Consent: I have reviewed the patients History and Physical, chart, labs and discussed the procedure including the risks, benefits and alternatives for the proposed anesthesia with the patient or authorized representative who has indicated his/her understanding and acceptance.       Plan Discussed with:   Anesthesia Plan Comments:         Anesthesia Quick Evaluation

## 2021-08-10 NOTE — Progress Notes (Signed)
   08/10/21 0419  Provider Notification  Provider Name/Title Dr. Marlowe Sax  Date Provider Notified 08/10/21  Time Provider Notified [phone]  Notification Type Page  Notification Reason Change in status (Hgb/Hct - 6.2/19.6)  Provider response See new orders   During the night, the patient remains in the Yellow MEWS d/t elevated HR (upper 120s) and RR (22-26).  He also continues to complain of nausea but no vomiting and is refusing all PO medications due to this.  His pain remains in the rib cage and rates as 7/10 to 10/10.  The Fentanyl 50 mcg is not controlling it.  Lab called and advised that hgb at 0341 was 6.2.  Patient assessed.  Surgical site to the back has no signs of bleeding.  During report from Day RN, patient did have 2 instances of emesis that was dark in color but no emesis during the night.  Dr. Marlowe Sax made aware and 1 Unit of Blood ordered and started.  Patient's temperature prior to the start of the transfusion was 99.5.  Also, his O2 level on 4 LPM was 89%.  His HR during the night has been 125-129.  It was increasing at times into the mid 130s.  Dr. Marlowe Sax made aware and ok given to proceed with the transfusion and to increase O2 to 5 LPM.  His O2 level is holding 90%-91%.  Order received to transfer patient to Progressive Care Unit.  Will continue to monitor patient.  Earleen Reaper RN

## 2021-08-10 NOTE — Progress Notes (Signed)
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  Patient was discharged home on PO antibiotics until September 04, 2021.  Patient reports since his discharge he continued to have significant amount of pain and he is unable to walk or sit up.  He described pain as piercing, not controlled with oral pain medications.  Patient has been taking his stool softeners and lactulose without any success. CTA chest negative for PE, moderate bilateral pleural effusions and associated atelectasis or consolidation.  CT abdomen and pelvis shows large stool burden, anasarca.  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 Multiple small fluid collections in the left posterior paraspinal phlegmon could represent tiny abscesses. Patient is started on IV antibiotics, infectious disease and neurosurgery consulted.  Neurosurgery consulted, Patient underwent T4 T5 laminectomy, left T5 pediculotomy for decompression of  spinal cord. Hospital course complicated by coffee-ground emesis.  GI consulted,  Patient is undergoing EGD today.   Assessment & Plan:   Principal Problem:   Osteomyelitis of thoracic spine (HCC) Active Problems:   Type 2 diabetes mellitus with diabetic polyneuropathy, without long-term current use of insulin (HCC)   Hyperlipidemia   GAD (generalized anxiety disorder)   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)   Essential hypertension   CKD (chronic kidney disease) stage 3, GFR 30-59 ml/min (HCC)   Pleural effusion, bilateral  MSSA bacteremia with osteomyelitis of thoracic  spine: Patient diagnosed with MSSA bacteremia and with known osteomyelitis of his thoracic spine (biopsy confirmed) He presented with worsening pain, found to have worsening of his osteomyelitis, T5 pathological fracture, new moderate to severe left eccentric canal stenosis at T5 with cord deformity. Neurosurgery consulted, He  underwent T4 T5 laminectomy, left T5 pediculotomy for decompression of  spinal cord on 10/14. Adequate pain control with OxyContin 15 mg twice daily, and oxycodone 5 mg every 4 hours as needed for pain. Continue cefazolin as per infectious disease for MSSA. Follow-up OR cultures and blood cultures. TEE performed on 07/29/2021 at previous hospitalization.  Hiccups: Patient has developed hiccups postprocedure.  Could be due to acid reflux. Continue baclofen, Protonix.  Hiccups improving.  Coffee-ground emesis /acute blood loss anemia. Patient continues to have vomiting followed by coffee-ground emesis. Hemoglobin has dropped from 8.1> 6.2. Monitor H&H, s/p 1 PRBC follow posttransfusion CBC GI is consulted, continue Protonix 40 IV twice daily Patient is scheduled to have EGD today.  Bilateral pleural effusions: CT chest negative for PE but shows bilateral pleural effusions with associated atelectasis or consolidation. Patient denies any cough, shortness of breath or fever, chills. No need for thoracocentesis at this time.  Constipation: Patient does not have a bowel movement since being on pain medication after discharge. Continue lactulose, senna,  Miralax TID, escalate as needed  Type 2 diabetes with neuropathy: Continue regular insulin sliding scale. Last hemoglobin A1c 7.5. Start Levemir 10 units daily.  Essential hypertension: Blood pressure fairly controlled. Continue amlodipine, hydralazine and metoprolol.  AKI on CKD stage IIIb: Serum creatinine was stable before procedure. Serum creatinine up from 1.47-> 2.48, could be due to inadequate hydration  during surgery. Continue IV hydration NS at 75  cc/h Avoid nephrotoxic medications, continue to monitor serum creatinine  Hyperlipidemia: Continue statin  Generalized anxiety disorder: Continue Xanax  Diabetic ulcer of the left great toe: Seems stable, no signs of infection. Patient was seen by podiatry at last hospitalization Recommended daily wound care with mupirocin and dry sterile dressing Wound care consult  Pilonidal cyst: Unable to examine because of back pain.  COVID-19: Patient had positive test in July,  more than 8 weeks ago, no longer needs airborne precautions.  Normochromic normocytic anemia. Likely anemia of chronic disease. Patient had coffee-ground emesis, going for EGD today.   DVT prophylaxis: SCDs Code Status: Full code. Family Communication: No family at bed side. Disposition Plan:   Status is: Inpatient  Remains inpatient appropriate because: Requiring IV antibiotics /work-up for hematemesis Anticipated discharge: Home  Consultants:  Neurosurgery Infectious diseases  Procedures: Scheduled laminectomy, debridement of abscess Antimicrobials:   Anti-infectives (From admission, onward)    Start     Dose/Rate Route Frequency Ordered Stop   08/06/21 2200  [MAR Hold]  ceFAZolin (ANCEF) IVPB 2g/100 mL premix        (MAR Hold since Mon 08/10/2021 at 1105.Hold Reason: Transfer to a Procedural area)   2 g 200 mL/hr over 30 Minutes Intravenous Every 8 hours 08/06/21 1829     08/06/21 1415  vancomycin (VANCOREADY) IVPB 1750 mg/350 mL  Status:  Discontinued        1,750 mg 175 mL/hr over 120 Minutes Intravenous Every 24 hours 08/06/21 1400 08/06/21 1829   08/06/21 1415  ceFEPIme (MAXIPIME) 2 g in sodium chloride 0.9 % 100 mL IVPB  Status:  Discontinued        2 g 200 mL/hr over 30 Minutes Intravenous Every 8 hours 08/06/21 1400 08/06/21 1829       Subjective: Patient was seen and examined at bedside.  Overnight events noted.   Patient had  coffee-ground emesis, Hb dropped from 8- > 6.2 requiring 1 PRBC transfusion.   Patient is scheduled for EGD today. S/p T4-T5 laminectomy and debridement of abscess on 10/14. Patient reports hiccups has improved, pain is better controlled.  Objective: Vitals:   08/10/21 0731 08/10/21 0741 08/10/21 1033 08/10/21 1110  BP: (!) 141/83  133/87 (!) 165/72  Pulse: (!) 128  (!) 127 (!) 125  Resp:   17 (!) 25  Temp: 98.1 F (36.7 C)  98.4 F (36.9 C) 97.8 F (36.6 C)  TempSrc: Oral  Oral Temporal  SpO2: 90% 92% 97% 98%  Weight:      Height:        Intake/Output Summary (Last 24 hours) at 08/10/2021 1146 Last data filed at 08/10/2021 0800 Gross per 24 hour  Intake 2609.77 ml  Output 2225 ml  Net 384.77 ml   Filed Weights   08/06/21 0847 08/07/21 0628 08/09/21 0502  Weight: 86.2 kg 89.6 kg 89.3 kg    Examination:  General exam: Appears comfortable, lying on the bed, reports being in a lot of pain. respiratory system: Clear to auscultation bilaterally, RR 18. Cardiovascular system: S1-S2 heard, regular rate and rhythm, no murmur. Gastrointestinal system: Abdomen is soft, nontender, mildly distended, BS+ Central nervous system: Alert and oriented x 3. No focal neurological deficits. Extremities: No edema, partial erythematous wound in the left great big toe. Skin: No rashes, lesions or ulcers Psychiatry: Judgement and insight appear normal. Mood & affect appropriate.     Data Reviewed: I have personally reviewed following labs and imaging studies  CBC: Recent Labs  Lab 08/06/21  [id number] 08/07/21 0447 08/07/21 1553 08/09/21 0311 08/09/21 1438 08/10/21 0341  WBC 13.1* 10.0  --  24.8*  --  23.2*  NEUTROABS 10.0*  --   --   --   --   --   HGB 8.4* 7.8* 7.8* 8.1* 7.9* 6.2*  HCT 26.1* 23.9* 23.0* 24.8* 24.6* 19.6*  MCV 85.9 84.2  --  86.7  --  87.9  PLT 518* 500*  --  492*  --  [id number]   Basic Metabolic Panel: Recent Labs  Lab 08/06/21 0937 08/07/21 0447 08/07/21 1553  08/09/21 0311 08/10/21 0341  NA 134* 133* 137 134* 135  K 4.6 4.2 4.8 4.7 4.1  CL 101 102  --  100 103  CO2 20* 24  --  20* 21*  GLUCOSE 167* 275*  --  247* 286*  BUN 21* 19  --  35* 58*  CREATININE 1.50* 1.47*  --  2.48* 2.34*  CALCIUM 9.4 9.0  --  8.5* 8.3*  MG  --   --   --  2.0 2.0  PHOS  --   --   --  4.6 3.2   GFR: Estimated Creatinine Clearance: 41.6 mL/min (A) (by C-G formula based on SCr of 2.34 mg/dL (H)). Liver Function Tests: Recent Labs  Lab 08/06/21 0937 08/07/21 0447 08/09/21 0311  AST 17 13* 13*  ALT <5 <5 6  ALKPHOS 163* 145* 122  BILITOT 0.5 0.6 0.9  PROT 7.0 6.1* 6.1*  ALBUMIN 2.0* 1.7* 1.7*   Recent Labs  Lab 08/06/21 0937  LIPASE 21   No results for input(s): AMMONIA in the last 168 hours. Coagulation Profile: No results for input(s): INR, PROTIME in the last 168 hours. Cardiac Enzymes: No results for input(s): CKTOTAL, CKMB, CKMBINDEX, TROPONINI in the last 168 hours. BNP (last 3 results) No results for input(s): PROBNP in the last 8760 hours. HbA1C: No results for input(s): HGBA1C in the last 72 hours. CBG: Recent Labs  Lab 08/09/21 0635 08/09/21 1100 08/09/21 1627 08/09/21 2109 08/10/21 1035  GLUCAP 244* 264* 258* 251* 346*   Lipid Profile: No results for input(s): CHOL, HDL, LDLCALC, TRIG, CHOLHDL, LDLDIRECT in the last 72 hours. Thyroid Function Tests: No results for input(s): TSH, T4TOTAL, FREET4, T3FREE, THYROIDAB in the last 72 hours. Anemia Panel: No results for input(s): VITAMINB12, FOLATE, FERRITIN, TIBC, IRON, RETICCTPCT in the last 72 hours. Sepsis Labs: Recent Labs  Lab 08/06/21 [id number] 08/06/21 1828  LATICACIDVEN 0.6 0.5    Recent Results (from the past 240 hour(s))  Culture, blood (routine x 2)     Status: None (Preliminary result)   Collection Time: 08/06/21  9:10 AM   Specimen: BLOOD  Result Value Ref Range Status   Specimen Description BLOOD SITE NOT SPECIFIED  Final   Special Requests   Final    BOTTLES  DRAWN AEROBIC AND ANAEROBIC Blood Culture results may not be optimal due to an inadequate volume of blood received in culture bottles   Culture   Final    NO GROWTH 4 DAYS Performed at [facility], [place] [address] [place], [place] [address]    Report Status PENDING  Incomplete  Resp Panel by RT-PCR (Flu A&B, Covid) Nasopharyngeal Swab     Status: Abnormal   Collection Time: 08/06/21  9:36 AM   Specimen: Nasopharyngeal Swab; Nasopharyngeal(NP) swabs in vial transport medium  Result Value Ref Range Status   SARS Coronavirus 2 by RT PCR POSITIVE (A) NEGATIVE Final    Comment: rn  sarah b. [id number] fcp (NOTE) SARS-CoV-2 target nucleic acids are DETECTED.  The SARS-CoV-2 RNA is generally detectable in upper respiratory specimens during the acute phase of infection. Positive results are indicative of the presence of the identified virus, but do not rule out bacterial infection or co-infection with other pathogens not detected by the test. Clinical correlation with patient history and other diagnostic information is necessary to determine patient infection status. The expected result is Negative.  Fact Sheet for Patients: EntrepreneurPulse.com.au  Fact Sheet for Healthcare Providers: IncredibleEmployment.be  This test is not yet approved or cleared by the [place] FDA and  has been authorized for detection and/or diagnosis of SARS-CoV-2 by FDA under an Emergency Use Authorization (EUA).  This EUA will remain in effect (meaning this test can be used) for the duration of  the COVID-19 declaration und er Section 564(b)(1) of the Act, 21 U.S.C. section 360bbb-3(b)(1), unless the authorization is terminated or revoked sooner.     Influenza A by PCR NEGATIVE NEGATIVE Final   Influenza B by PCR NEGATIVE NEGATIVE Final    Comment: (NOTE) The Xpert Xpress SARS-CoV-2/FLU/RSV plus assay is intended as an aid in the diagnosis of influenza from  Nasopharyngeal swab specimens and should not be used as a sole basis for treatment. Nasal washings and aspirates are unacceptable for Xpert Xpress SARS-CoV-2/FLU/RSV testing.  Fact Sheet for Patients: EntrepreneurPulse.com.au  Fact Sheet for Healthcare Providers: IncredibleEmployment.be  This test is not yet approved or cleared by the [place] FDA and has been authorized for detection and/or diagnosis of SARS-CoV-2 by FDA under an Emergency Use Authorization (EUA). This EUA will remain in effect (meaning this test can be used) for the duration of the COVID-19 declaration under Section 564(b)(1) of the Act, 21 U.S.C. section 360bbb-3(b)(1), unless the authorization is terminated or revoked.  Performed at [facility], [place] [address] [place], [place] [address]   Culture, blood (routine x 2)     Status: None (Preliminary result)   Collection Time: 08/06/21  9:37 AM   Specimen: BLOOD RIGHT HAND  Result Value Ref Range Status   Specimen Description BLOOD RIGHT HAND  Final   Special Requests   Final    BOTTLES DRAWN AEROBIC AND ANAEROBIC Blood Culture adequate volume   Culture   Final    NO GROWTH 4 DAYS Performed at [facility], [place] [address] [place], [place] [address]    Report Status PENDING  Incomplete  Urine Culture     Status: None   Collection Time: 08/06/21  1:50 PM   Specimen: Urine, Clean Catch  Result Value Ref Range Status   Specimen Description URINE, CLEAN CATCH  Final   Special Requests NONE  Final   Culture   Final    NO GROWTH Performed at [facility], [place] [address], [place], [place] [address]    Report Status 08/07/2021 FINAL  Final  Surgical pcr screen     Status: None   Collection Time: 08/06/21  7:45 PM   Specimen: Nasal Mucosa; Nasal Swab  Result Value Ref Range Status   MRSA, PCR NEGATIVE NEGATIVE Final   Staphylococcus aureus NEGATIVE NEGATIVE Final    Comment: (NOTE) The Xpert SA  Assay (FDA approved for NASAL specimens in patients 80 years of age and older), is one component of a comprehensive surveillance program. It is not intended to diagnose infection nor to guide or monitor treatment. Performed at [facility], [place]  [address], [place], [place] [address]   Aerobic/Anaerobic Culture w Gram Stain (surgical/deep wound)     Status: None (Preliminary result)   Collection Time: 08/07/21  5:04 PM   Specimen: PATH Other; Tissue  Result Value Ref Range Status   Specimen Description ABSCESS  Final   Special Requests EPIDURAL SWABS PT ON ANCEF VANC MAXIPIME  Final   Gram Stain   Final    NO WBC SEEN NO ORGANISMS SEEN Performed at [facility], [address], [place], [place] [address]    Culture   Final    RARE STAPHYLOCOCCUS AUREUS SUSCEPTIBILITIES TO FOLLOW NO ANAEROBES ISOLATED; CULTURE IN PROGRESS FOR 5 DAYS    Report Status PENDING  Incomplete  Aerobic/Anaerobic Culture w Gram Stain (surgical/deep wound)     Status: None (Preliminary result)   Collection Time: 08/07/21  5:05 PM   Specimen: PATH Other; Tissue  Result Value Ref Range Status   Specimen Description TISSUE  Final   Special Requests EPIDURAL PT ON VANC MAXIPIME ANCEF  Final   Gram Stain   Final    NO WBC SEEN NO ORGANISMS SEEN Performed at [facility], [address], [place], [place] [address]    Culture   Final    RARE STAPHYLOCOCCUS AUREUS CULTURE REINCUBATED FOR BETTER GROWTH RESULT CALLED TO, READ BACK BY AND VERIFIED WITH: RN L.ILLERBRUN ON AS:[id number] AT 74 BY E.PARRISH NO ANAEROBES ISOLATED; CULTURE IN PROGRESS FOR 5 DAYS    Report Status PENDING  Incomplete    Radiology Studies: No results found.  Scheduled Meds:  [MAR Hold] (feeding supplement) PROSource Plus  30 mL Oral BID BM   [MAR Hold] sodium chloride   Intravenous Once   [MAR Hold] amLODipine  10 mg Oral Daily   [MAR Hold] baclofen  10 mg Oral TID   [MAR Hold] Chlorhexidine Gluconate Cloth  6 each  Topical Daily   [MAR Hold] feeding supplement  1 Container Oral TID BM   [MAR Hold] feeding supplement (GLUCERNA SHAKE)  237 mL Oral TID BM   [MAR Hold] gabapentin  300 mg Oral BID   [MAR Hold] guaiFENesin  600 mg Oral BID   [MAR Hold] hydrALAZINE  50 mg Oral TID   [MAR Hold] insulin aspart  0-9 Units Subcutaneous TID WC   [MAR Hold] insulin detemir  10 Units Subcutaneous Daily   [MAR Hold] lactulose  20 g Oral BID   [MAR Hold] multivitamin with minerals  1 tablet Oral Daily   [MAR Hold] mupirocin ointment  1 application Topical BID   [MAR Hold] oxyCODONE  15 mg Oral Q12H   [MAR Hold] pantoprazole (PROTONIX) IV  40 mg Intravenous Q12H   polyethylene glycol  17 g Oral TID   [MAR Hold] senna-docusate  1 tablet Oral BID   [MAR Hold] sodium chloride flush  10-40 mL Intracatheter Q12H   [MAR Hold] sodium chloride flush  3 mL Intravenous Q12H   [MAR Hold] traZODone  100 mg Oral QHS   Continuous Infusions:  [MAR Hold] sodium chloride     sodium chloride     [MAR Hold]  ceFAZolin (ANCEF) IV 2 g (08/10/21 0950)   [MAR Hold] promethazine (PHENERGAN) injection (IM or IVPB) 25 mg (08/10/21 0506)     LOS: 4 days    Time spent: 25 mins    Pharaoh Pio, MD [facility]   If 7PM-7AM, please contact night-coverage

## 2021-08-10 NOTE — Significant Event (Signed)
Rapid Response Event Note   Reason for Call :  Increased oxygen requirments Tachypnea Pain  Initial Focused Assessment:  Pt AO, lying in bed. He appears to be uncomfortable and endorses right rib pain 10/10- "unbearable". Oxygen saturation 86% despite increasing supplemental oxygen from 3LNC to 15L HFNC, pt eventually transitioned to 100% NRB. Pt is breathing through his nose, however he is taking short, fast breaths. Pt encouraged to slow breathing down by asking him to breath in his nose and out his mouth, SpO2 reading remains 86-88%. Pt is tachycardic with HR sustaining 130 bpm.   VS: T 98.15F, BP 179/75, HR 130, RR 30, SpO2 92% on 100% NRB  Interventions:  -67mg IV Fent -'40mg'$  IV Lasix -CXR -ABG  Plan of Care:  Transfer to ICU  Event Summary:  MD Notified: PCCM consulted, S. Minor, NP and Dr. CTacy Learnat bedside Call Time: 1[place]Time: 1[place]: 1[place] RN

## 2021-08-10 NOTE — Progress Notes (Addendum)
I have seen and examined the patient. I have personally reviewed the clinical findings, laboratory findings, microbiological data and imaging studies. The assessment and treatment plan was discussed with the Nurse Practitioner Mauricio Po. I agree with her/his recommendations except following additions/corrections.  Patient had EGD today for coffee ground emesis. Findings " severe LA grade D esophagitis with black discoloration of mucosa. /large amount of retained coffee-ground fluid in the stomach which was suction.  Large superficial ,duodenal bulb ulcer no evidence of active bleeding. Biopsy taken "  Remains afebrile WBC 23 Increase in oxygen requirement from 4L to 5L. 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 [place], [place] [address] Phone: [phone]  Fax: [facility]  Date of Admission:  08/06/2021     Total days of antibiotics 18         ASSESSMENT:  Jose [name] developed coffee-ground emesis and is having EGD today. Surgical specimens are growing MSSA which is concordance with previous bacteremia. Discussed the need to continue with antibiotics for 6 weeks of treatment starting from the date of surgery. Pain control remains a challenge and awaiting Palliative Care where he was last seen by Dr. Domingo Cocking whom he wishes to consult with again. Continue current dose of Cefazolin. Continue current pain management per  Primary Team and await results of EGD.   PLAN:  Continue current dose of Cefazolin Wound care per Dr. Kathyrn Sheriff Awaiting Palliative Care consult.  EGD today with GI for coffee-ground emesis Pain management and remaining care per Primary Team ID will continue to follow.   Principal Problem:   Osteomyelitis of thoracic spine (HCC) Active Problems:   Type 2 diabetes mellitus with diabetic polyneuropathy, without long-term current use of insulin (HCC)   Hyperlipidemia   GAD (generalized anxiety disorder)   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)   Essential hypertension   CKD (chronic kidney disease) stage 3, GFR 30-59 ml/min (HCC)   Pleural effusion, bilateral    [MAR Hold] (feeding supplement) PROSource Plus  30 mL Oral BID BM   [MAR Hold] sodium chloride   Intravenous Once   [MAR Hold] amLODipine  10 mg Oral Daily   [MAR Hold] baclofen  10 mg Oral TID   [MAR Hold] Chlorhexidine Gluconate Cloth  6 each Topical Daily   [MAR Hold] feeding supplement  1 Container Oral TID BM   [MAR Hold] feeding supplement (GLUCERNA SHAKE)  237 mL Oral TID BM   fentaNYL       [MAR Hold] gabapentin  300 mg Oral BID   [MAR Hold] guaiFENesin  600 mg Oral BID   [MAR Hold] hydrALAZINE  50 mg Oral TID   [MAR Hold] insulin aspart  0-9 Units Subcutaneous TID WC   [MAR Hold] insulin detemir  10 Units Subcutaneous Daily   [MAR Hold] lactulose  20 g Oral BID   [MAR Hold] multivitamin with minerals  1 tablet Oral Daily   [MAR Hold] mupirocin ointment  1 application Topical  BID   [MAR Hold] oxyCODONE  15 mg Oral Q12H   [MAR Hold] pantoprazole (PROTONIX) IV  40 mg Intravenous Q12H   polyethylene glycol  17 g Oral TID   [MAR Hold] senna-docusate  1 tablet Oral BID   [MAR Hold] sodium chloride flush  10-40 mL Intracatheter Q12H   [MAR Hold] sodium chloride flush  3 mL Intravenous Q12H   [MAR Hold] traZODone  100 mg Oral QHS    SUBJECTIVE:  Afebrile overnight. Developed coffee-ground emesis yesterday with  GI performed EGD today. Continues to have significant back pain that is poorly controlled with the current regimen. Wife is at bedside and provides portions of the history. Awaiting Palliative Care consult to assist with pain control. Has concern infection may be stemming from his toe.   Allergies  Allergen Reactions   Cranberry Itching     Review of Systems: Review of Systems  Constitutional:  Negative for chills, fever and weight loss.  Respiratory:  Negative for cough, shortness of breath and wheezing.   Cardiovascular:  Negative for chest pain and leg swelling.  Gastrointestinal:  Negative for abdominal pain, constipation, diarrhea, nausea and vomiting.  Musculoskeletal:  Positive for back pain.  Skin:  Negative for rash.     OBJECTIVE: Vitals:   08/10/21 0731 08/10/21 0741 08/10/21 1033 08/10/21 1110  BP: (!) 141/83  133/87 (!) 165/72  Pulse: (!) 128  (!) 127 (!) 125  Resp:   17 (!) 25  Temp: 98.1 F (36.7 C)  98.4 F (36.9 C) 97.8 F (36.6 C)  TempSrc: Oral  Oral Temporal  SpO2: 90% 92% 97% 98%  Weight:      Height:       Body mass index is 27.46 kg/m.  Physical Exam Constitutional:      General: He is not in acute distress.    Appearance: He is well-developed. He is ill-appearing.  Cardiovascular:     Rate and Rhythm: Regular rhythm. Tachycardia present.     Heart sounds: Normal heart sounds.  Pulmonary:     Effort: Pulmonary effort is normal.     Breath sounds: Normal breath sounds.  Skin:    General: Skin is warm and dry.     Coloration: Skin is pale.  Neurological:     Mental Status: He is alert and oriented to person, place, and time.  Psychiatric:        Mood and Affect: Mood normal.    Lab Results Lab Results  Component Value Date   WBC 23.2 (H) 08/10/2021   HGB 6.2 (LL) 08/10/2021   HCT 19.6 (L) 08/10/2021   MCV 87.9 08/10/2021   PLT 522 (H) 08/10/2021    Lab Results  Component Value Date   CREATININE 2.34 (H) 08/10/2021   BUN 58 (H)  08/10/2021   NA 135 08/10/2021   K 4.1 08/10/2021   CL 103 08/10/2021   CO2 21 (L) 08/10/2021    Lab Results  Component Value Date   ALT 6 08/09/2021   AST 13 (L) 08/09/2021   ALKPHOS 122 08/09/2021   BILITOT 0.9 08/09/2021     Microbiology: Recent Results (from the past 240 hour(s))  Culture, blood (routine x 2)     Status: None (Preliminary result)   Collection Time: 08/06/21  9:10 AM   Specimen: BLOOD  Result Value Ref Range Status   Specimen Description BLOOD SITE NOT SPECIFIED  Final   Special Requests   Final    BOTTLES DRAWN AEROBIC AND ANAEROBIC  Blood Culture results may not be optimal due to an inadequate volume of blood received in culture bottles   Culture   Final    NO GROWTH 4 DAYS Performed at [facility] [address], [place], [place] [address]    Report Status PENDING  Incomplete  Resp Panel by RT-PCR (Flu A&B, Covid) Nasopharyngeal Swab     Status: Abnormal   Collection Time: 08/06/21  9:36 AM   Specimen: Nasopharyngeal Swab; Nasopharyngeal(NP) swabs in vial transport medium  Result Value Ref Range Status   SARS Coronavirus 2 by RT PCR POSITIVE (A) NEGATIVE Final    Comment: rn sarah b. [id number] fcp (NOTE) SARS-CoV-2 target nucleic acids are DETECTED.  The SARS-CoV-2 RNA is generally detectable in upper respiratory specimens during the acute phase of infection. Positive results are indicative of the presence of the identified virus, but do not rule out bacterial infection or co-infection with other pathogens not detected by the test. Clinical correlation with patient history and other diagnostic information is necessary to determine patient infection status. The expected result is Negative.  Fact Sheet for Patients: EntrepreneurPulse.com.au  Fact Sheet for Healthcare Providers: IncredibleEmployment.be  This test is not yet approved or cleared by the [place] FDA and  has been authorized for  detection and/or diagnosis of SARS-CoV-2 by FDA under an Emergency Use Authorization (EUA).  This EUA will remain in effect (meaning this test can be used) for the duration of  the COVID-19 declaration und er Section 564(b)(1) of the Act, 21 U.S.C. section 360bbb-3(b)(1), unless the authorization is terminated or revoked sooner.     Influenza A by PCR NEGATIVE NEGATIVE Final   Influenza B by PCR NEGATIVE NEGATIVE Final    Comment: (NOTE) The Xpert Xpress SARS-CoV-2/FLU/RSV plus assay is intended as an aid in the diagnosis of influenza from Nasopharyngeal swab specimens and should not be used as a sole basis for treatment. Nasal washings and aspirates are unacceptable for Xpert Xpress SARS-CoV-2/FLU/RSV testing.  Fact Sheet for Patients: EntrepreneurPulse.com.au  Fact Sheet for Healthcare Providers: IncredibleEmployment.be  This test is not yet approved or cleared by the [place] FDA and has been authorized for detection and/or diagnosis of SARS-CoV-2 by FDA under an Emergency Use Authorization (EUA). This EUA will remain in effect (meaning this test can be used) for the duration of the COVID-19 declaration under Section 564(b)(1) of the Act, 21 U.S.C. section 360bbb-3(b)(1), unless the authorization is terminated or revoked.  Performed at [facility], [place] [address], [place], [place] [address]   Culture, blood (routine x 2)     Status: None (Preliminary result)   Collection Time: 08/06/21  9:37 AM   Specimen: BLOOD RIGHT HAND  Result Value Ref Range Status   Specimen Description BLOOD RIGHT HAND  Final   Special Requests   Final    BOTTLES DRAWN AEROBIC AND ANAEROBIC Blood Culture adequate volume   Culture   Final    NO GROWTH 4 DAYS Performed at [facility], [place] [address], [place], [place] [address]    Report Status PENDING  Incomplete  Urine Culture     Status: None   Collection Time: 08/06/21  1:50 PM    Specimen: Urine, Clean Catch  Result Value Ref Range Status   Specimen Description URINE, CLEAN CATCH  Final   Special Requests NONE  Final   Culture   Final    NO GROWTH Performed at [facility], [place]  [address] [place], [place] [address]    Report Status 08/07/2021 FINAL  Final  Surgical pcr screen     Status: None   Collection Time: 08/06/21  7:45 PM   Specimen: Nasal Mucosa; Nasal Swab  Result Value Ref Range Status   MRSA, PCR NEGATIVE NEGATIVE Final   Staphylococcus aureus NEGATIVE NEGATIVE Final    Comment: (NOTE) The Xpert SA Assay (FDA approved for NASAL specimens in patients 38 years of age and older), is one component of a comprehensive surveillance program. It is not intended to diagnose infection nor to guide or monitor treatment. Performed at [facility], [place] [address], [place], [place] [address]   Aerobic/Anaerobic Culture w Gram Stain (surgical/deep wound)     Status: None (Preliminary result)   Collection Time: 08/07/21  5:04 PM   Specimen: PATH Other; Tissue  Result Value Ref Range Status   Specimen Description ABSCESS  Final   Special Requests EPIDURAL SWABS PT ON ANCEF VANC MAXIPIME  Final   Gram Stain   Final    NO WBC SEEN NO ORGANISMS SEEN Performed at [facility], [place] [address], [place], [place] [address]    Culture   Final    RARE STAPHYLOCOCCUS AUREUS SUSCEPTIBILITIES TO FOLLOW NO ANAEROBES ISOLATED; CULTURE IN PROGRESS FOR 5 DAYS    Report Status PENDING  Incomplete  Aerobic/Anaerobic Culture w Gram Stain (surgical/deep wound)     Status: None (Preliminary result)   Collection Time: 08/07/21  5:05 PM   Specimen: PATH Other; Tissue  Result Value Ref Range Status   Specimen Description TISSUE  Final   Special Requests EPIDURAL PT ON VANC MAXIPIME ANCEF  Final   Gram Stain   Final    NO WBC SEEN NO ORGANISMS SEEN Performed at [facility], [address], [place], [place] [address]    Culture   Final    RARE  STAPHYLOCOCCUS AUREUS CULTURE REINCUBATED FOR BETTER GROWTH RESULT CALLED TO, READ BACK BY AND VERIFIED WITH: RN L.ILLERBRUN ON AS:[id number] AT 58 BY E.PARRISH NO ANAEROBES ISOLATED; CULTURE IN PROGRESS FOR 5 DAYS    Report Status PENDING  Incomplete     Terri Piedra, [place]  08/10/2021  12:04 PM

## 2021-08-10 NOTE — Brief Op Note (Signed)
08/06/2021 - 08/10/2021  12:38 PM  PATIENT:  [name]  45 y.o. male  PRE-OPERATIVE DIAGNOSIS:  Upper GI bleed  POST-OPERATIVE DIAGNOSIS:  severe esophagitis, duodenal ulcer, bx duodenum, stomach and esophagus   PROCEDURE:  Procedure(s): ESOPHAGOGASTRODUODENOSCOPY (EGD) WITH PROPOFOL (N/A) BIOPSY  SURGEON:  Surgeon(s) and Role:    * Alijah Akram, MD - Primary   Findings ----------- -EGD showed severe LA grade D esophagitis with black discoloration of mucosa.  Also showed large amount of retained coffee-ground fluid in the stomach which was suction.  Large superficial ,duodenal bulb ulcer no evidence of active bleeding.  Biopsies taken.   Recommendations ------------------------- -Recommend IV twice daily PPI for at least for next 5 to 7 days if patient remains hospitalized -Commend twice daily PPI for at least 2 months, switch to once a day 40 mg PPI -Commend repeat EGD in 3 months to document healing of esophagitis -Okay to take soft diet -No further inpatient GI work-up planned.  GI will sign off.  Call us back if needed  Otis Brace MD, [place] 08/10/2021, 12:41 PM  Contact #  [phone]

## 2021-08-11 ENCOUNTER — Inpatient Hospital Stay (HOSPITAL_COMMUNITY): Payer: 59

## 2021-08-11 ENCOUNTER — Ambulatory Visit: Payer: 59 | Admitting: Endocrinology

## 2021-08-11 DIAGNOSIS — G9520 Unspecified cord compression: Secondary | ICD-10-CM | POA: Diagnosis not present

## 2021-08-11 DIAGNOSIS — N1832 Chronic kidney disease, stage 3b: Secondary | ICD-10-CM | POA: Diagnosis not present

## 2021-08-11 DIAGNOSIS — M869 Osteomyelitis, unspecified: Secondary | ICD-10-CM | POA: Diagnosis not present

## 2021-08-11 DIAGNOSIS — M4624 Osteomyelitis of vertebra, thoracic region: Secondary | ICD-10-CM | POA: Diagnosis not present

## 2021-08-11 DIAGNOSIS — J9601 Acute respiratory failure with hypoxia: Secondary | ICD-10-CM | POA: Diagnosis not present

## 2021-08-11 LAB — CBC
HCT: 20.3 % — ABNORMAL LOW (ref 39.0–52.0)
HCT: 22.6 % — ABNORMAL LOW (ref 39.0–52.0)
Hemoglobin: 6.6 g/dL — CL (ref 13.0–17.0)
Hemoglobin: 7.3 g/dL — ABNORMAL LOW (ref 13.0–17.0)
MCH: 28.3 pg (ref 26.0–34.0)
MCH: 28.1 pg (ref 26.0–34.0)
MCHC: 32.5 g/dL (ref 30.0–36.0)
MCHC: 32.3 g/dL (ref 30.0–36.0)
MCV: 87.1 fL (ref 80.0–100.0)
MCV: 86.9 fL (ref 80.0–100.0)
Platelets: 464 10*3/uL — ABNORMAL HIGH (ref 150–400)
Platelets: 439 10*3/uL — ABNORMAL HIGH (ref 150–400)
RBC: 2.33 MIL/uL — ABNORMAL LOW (ref 4.22–5.81)
RBC: 2.6 MIL/uL — ABNORMAL LOW (ref 4.22–5.81)
RDW: 14 % (ref 11.5–15.5)
RDW: 13.9 % (ref 11.5–15.5)
WBC: 24.9 10*3/uL — ABNORMAL HIGH (ref 4.0–10.5)
WBC: 30.5 10*3/uL — ABNORMAL HIGH (ref 4.0–10.5)
nRBC: 0 % (ref 0.0–0.2)
nRBC: 0 % (ref 0.0–0.2)

## 2021-08-11 LAB — BLOOD GAS, ARTERIAL
Acid-base deficit: 0.2 mmol/L (ref 0.0–2.0)
Bicarbonate: 23.3 mmol/L (ref 20.0–28.0)
Drawn by: 38235
FIO2: 100
O2 Saturation: 93.7 %
Patient temperature: 37
pCO2 arterial: 33.8 mmHg (ref 32.0–48.0)
pH, Arterial: 7.453 — ABNORMAL HIGH (ref 7.350–7.450)
pO2, Arterial: 66.6 mmHg — ABNORMAL LOW (ref 83.0–108.0)

## 2021-08-11 LAB — TYPE AND SCREEN
ABO/RH(D): A POS
Antibody Screen: NEGATIVE
Unit division: 0
Unit division: 0

## 2021-08-11 LAB — BPAM RBC
Blood Product Expiration Date: 202210292359
Blood Product Expiration Date: 202210302359
ISSUE DATE / TIME: 202210141556
ISSUE DATE / TIME: 202210170516
Unit Type and Rh: 6200
Unit Type and Rh: 6200

## 2021-08-11 LAB — CULTURE, BLOOD (ROUTINE X 2)
Culture: NO GROWTH
Culture: NO GROWTH
Special Requests: ADEQUATE

## 2021-08-11 LAB — PHOSPHORUS: Phosphorus: 3.2 mg/dL (ref 2.5–4.6)

## 2021-08-11 LAB — BASIC METABOLIC PANEL
Anion gap: 8 (ref 5–15)
BUN: 60 mg/dL — ABNORMAL HIGH (ref 6–20)
CO2: 22 mmol/L (ref 22–32)
Calcium: 8.3 mg/dL — ABNORMAL LOW (ref 8.9–10.3)
Chloride: 103 mmol/L (ref 98–111)
Creatinine, Ser: 1.92 mg/dL — ABNORMAL HIGH (ref 0.61–1.24)
GFR, Estimated: 43 mL/min — ABNORMAL LOW (ref >60)
Glucose, Bld: 231 mg/dL — ABNORMAL HIGH (ref 70–99)
Potassium: 3.6 mmol/L (ref 3.5–5.1)
Sodium: 133 mmol/L — ABNORMAL LOW (ref 135–145)

## 2021-08-11 LAB — MAGNESIUM: Magnesium: 1.9 mg/dL (ref 1.7–2.4)

## 2021-08-11 LAB — GLUCOSE, CAPILLARY
Glucose-Capillary: 106 mg/dL — ABNORMAL HIGH (ref 70–99)
Glucose-Capillary: 165 mg/dL — ABNORMAL HIGH (ref 70–99)
Glucose-Capillary: 195 mg/dL — ABNORMAL HIGH (ref 70–99)
Glucose-Capillary: 64 mg/dL — ABNORMAL LOW (ref 70–99)
Glucose-Capillary: 68 mg/dL — ABNORMAL LOW (ref 70–99)
Glucose-Capillary: 99 mg/dL (ref 70–99)

## 2021-08-11 LAB — PROCALCITONIN: Procalcitonin: 1.48 ng/mL

## 2021-08-11 LAB — SURGICAL PATHOLOGY

## 2021-08-11 LAB — PREPARE RBC (CROSSMATCH)

## 2021-08-11 IMAGING — CT CT HEAD W/O CM
3 of 4 series · 14 of 47 positions shown, 16 images · non-contrast
Comparison: [DATE]

CLINICAL DATA: Mental status change

EXAM:
CT HEAD WITHOUT CONTRAST
TECHNIQUE: Contiguous axial images were obtained from the base of the skull
through the vertex without intravenous contrast.

[Series 3: head 2.0 h70h · axial · 0.48mm/px · z∈[-463,-327]mm · 8 of 84 slices shown, 10 images]
[im 8/84  brain]
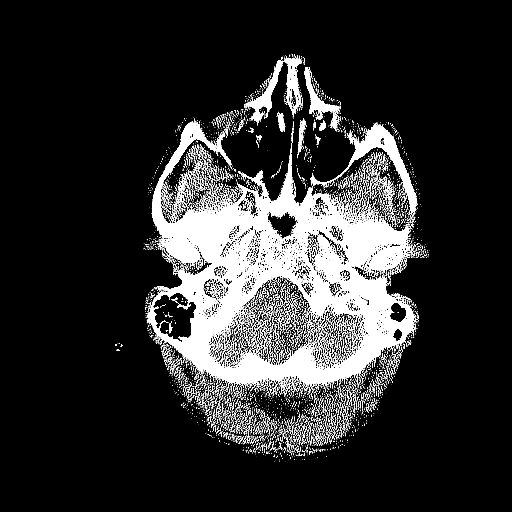
[im 8/84  bone]
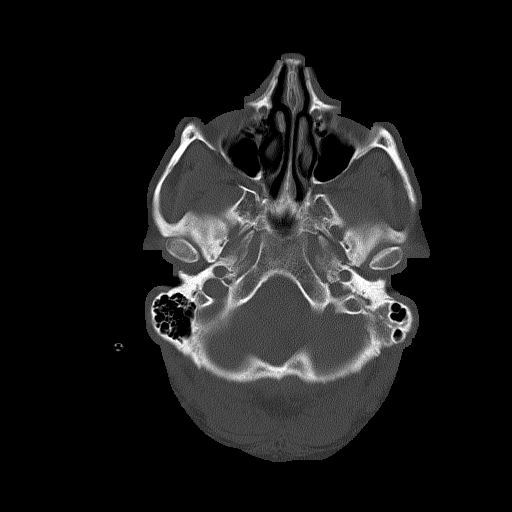
[im 16/84  brain]
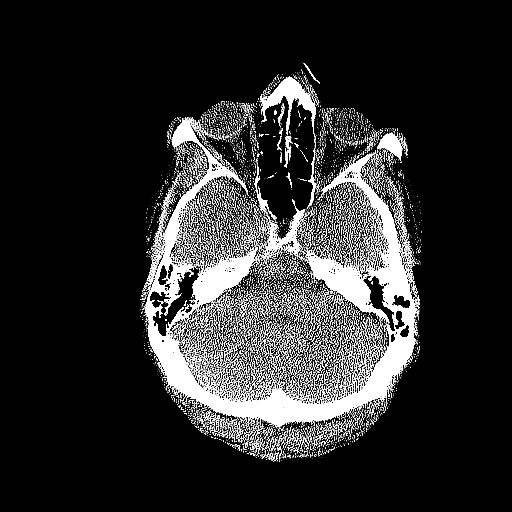
[im 28/84  brain]
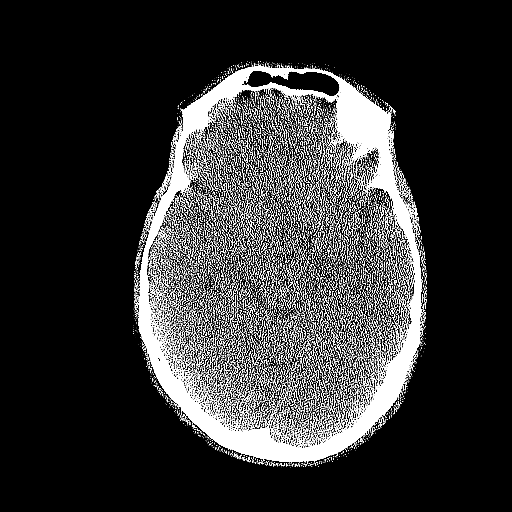
[im 36/84  brain]
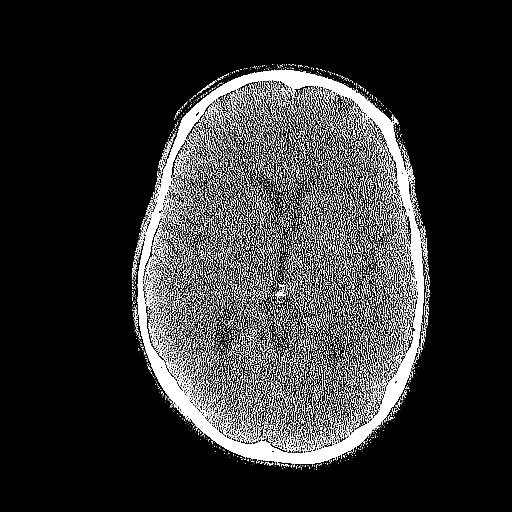
[im 48/84  brain]
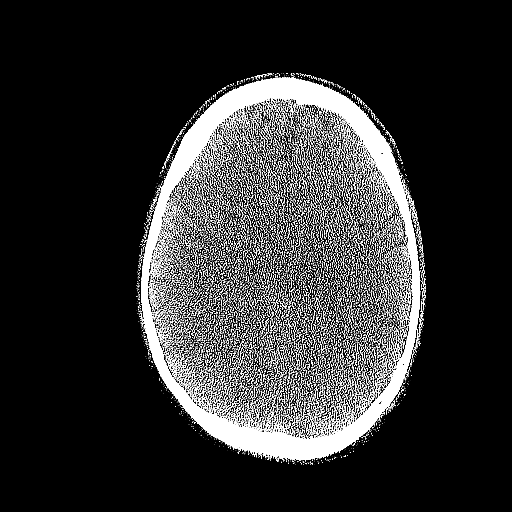
[im 48/84  bone]
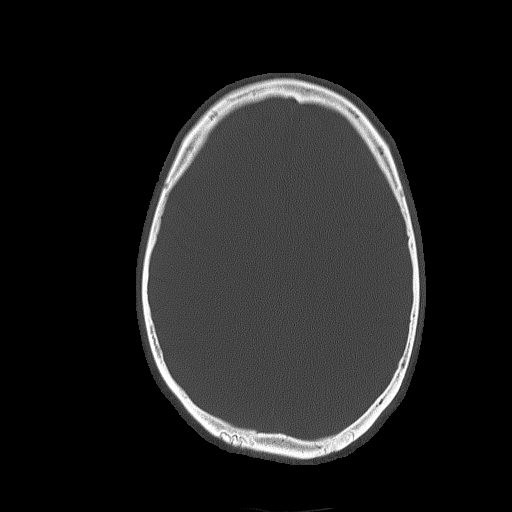
[im 56/84  brain]
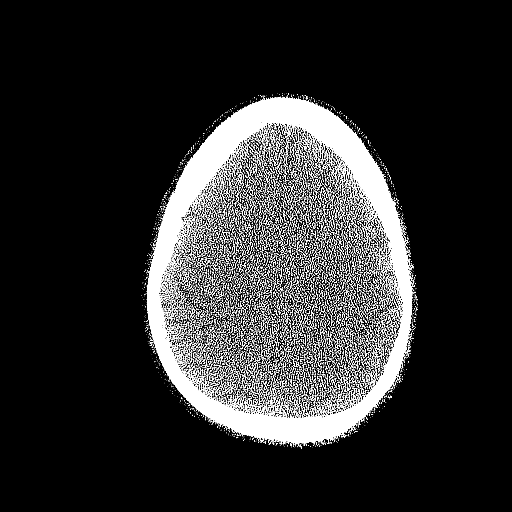
[im 68/84  brain]
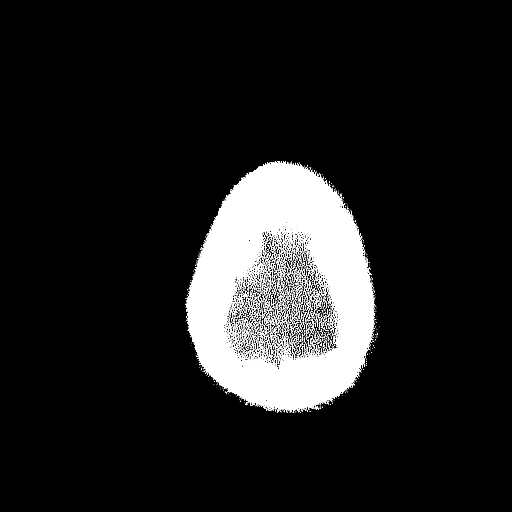
[im 76/84  brain]
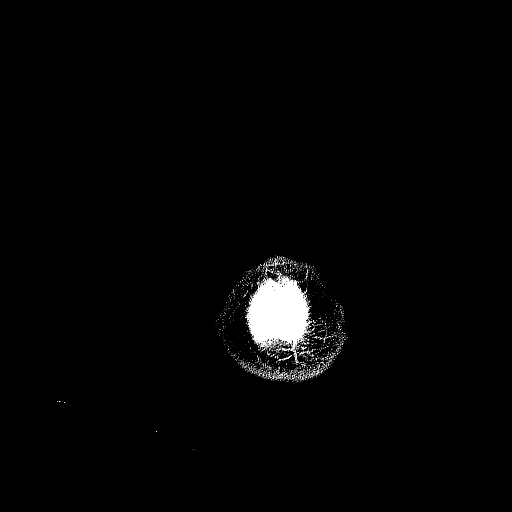

[Series 5: head 3.0 mpr cor · coronal · 0.32mm/px · 3 of 71 slices shown]
[im 24/71  brain]
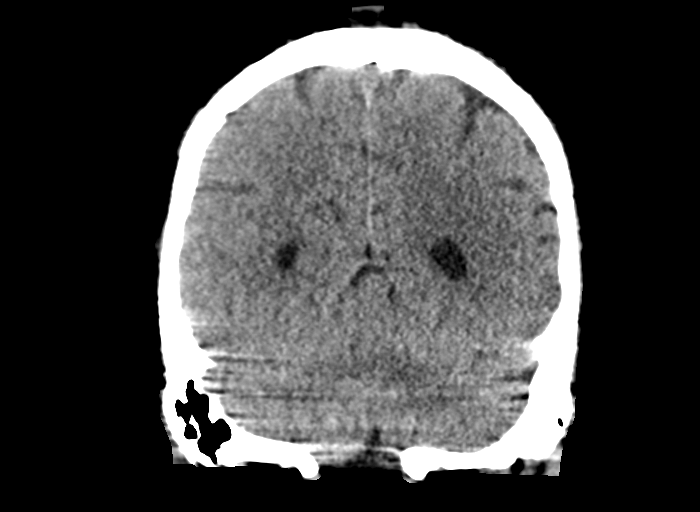
[im 32/71  brain]
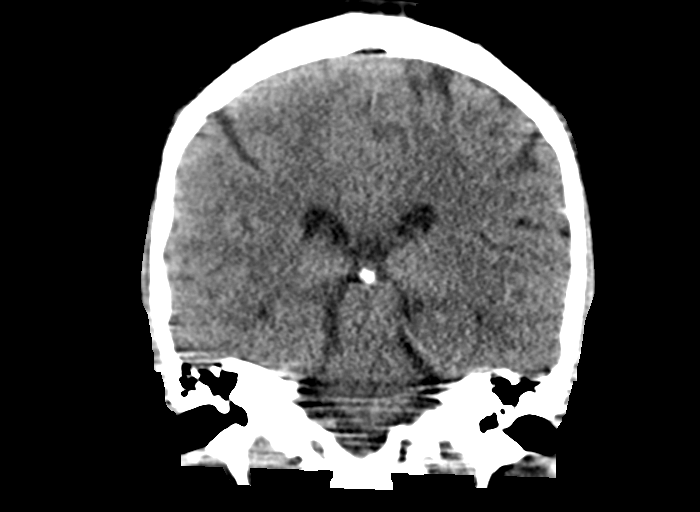
[im 39/71  brain]
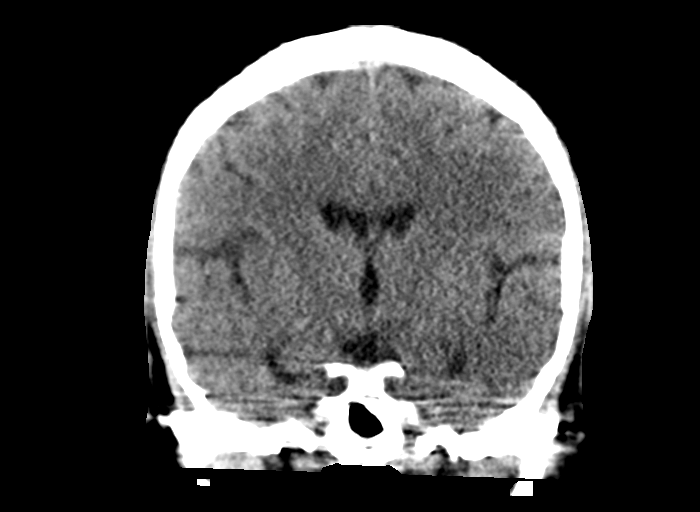

[Series 6: head 3.0 mpr sag · sagittal · 0.32mm/px · 3 of 55 slices shown]
[im 19/55  brain]
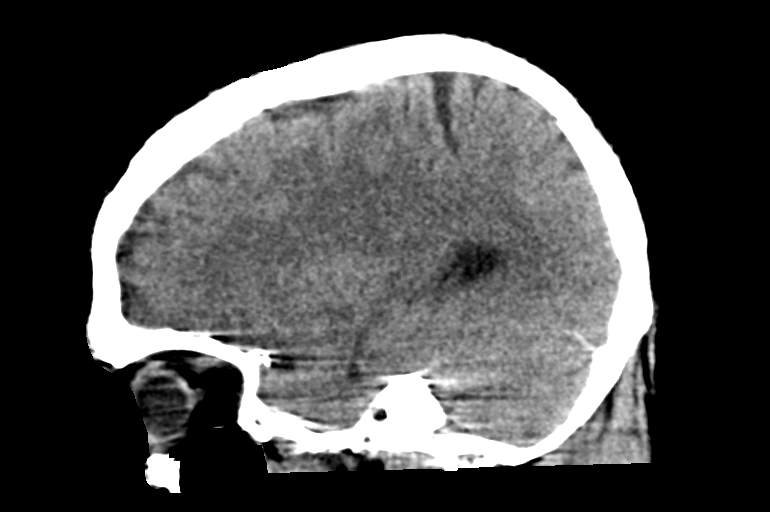
[im 28/55  brain]
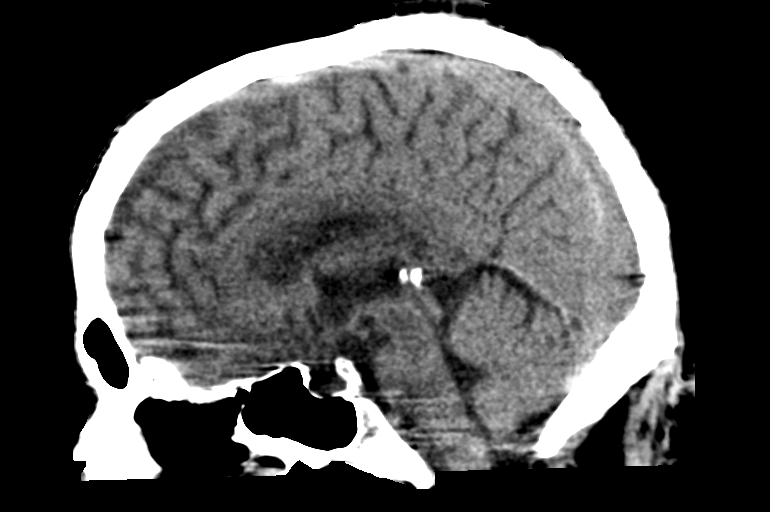
[im 37/55  brain]
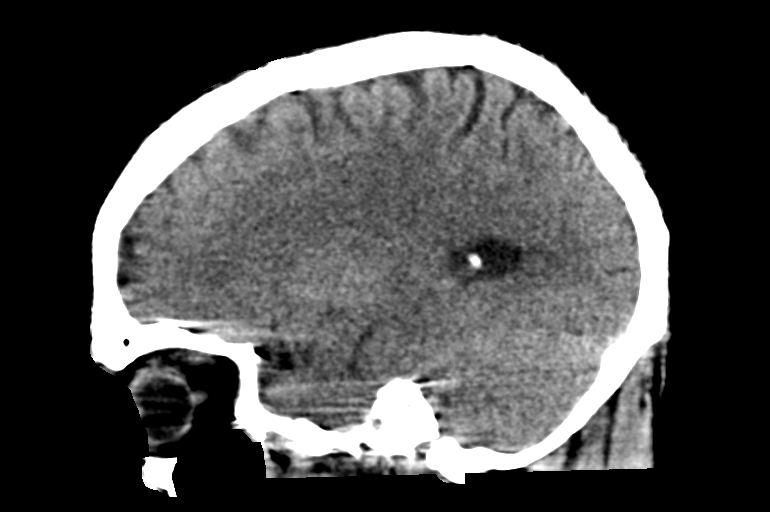

[14 of 47 positions shown; findings below may reference images not displayed]

FINDINGS: Brain: No evidence of acute infarction, hemorrhage, cerebral edema,
mass, mass effect, or midline shift. Ventricles and sulci are within
normal limits for age. No extra-axial fluid collection.

Vascular: No hyperdense vessel or unexpected calcification.

Skull: Normal. Negative for fracture or focal lesion.

Sinuses/Orbits: No acute finding.

Other: The mastoid air cells are well aerated.
IMPRESSION: No acute intracranial process.

## 2021-08-11 IMAGING — DX DG CHEST 1V PORT
1 series · 1 of 1 positions shown · non-contrast
Comparison: Film from the previous day.

CLINICAL DATA: Respiratory failure

EXAM:
PORTABLE CHEST 1 VIEW

[chest ap]
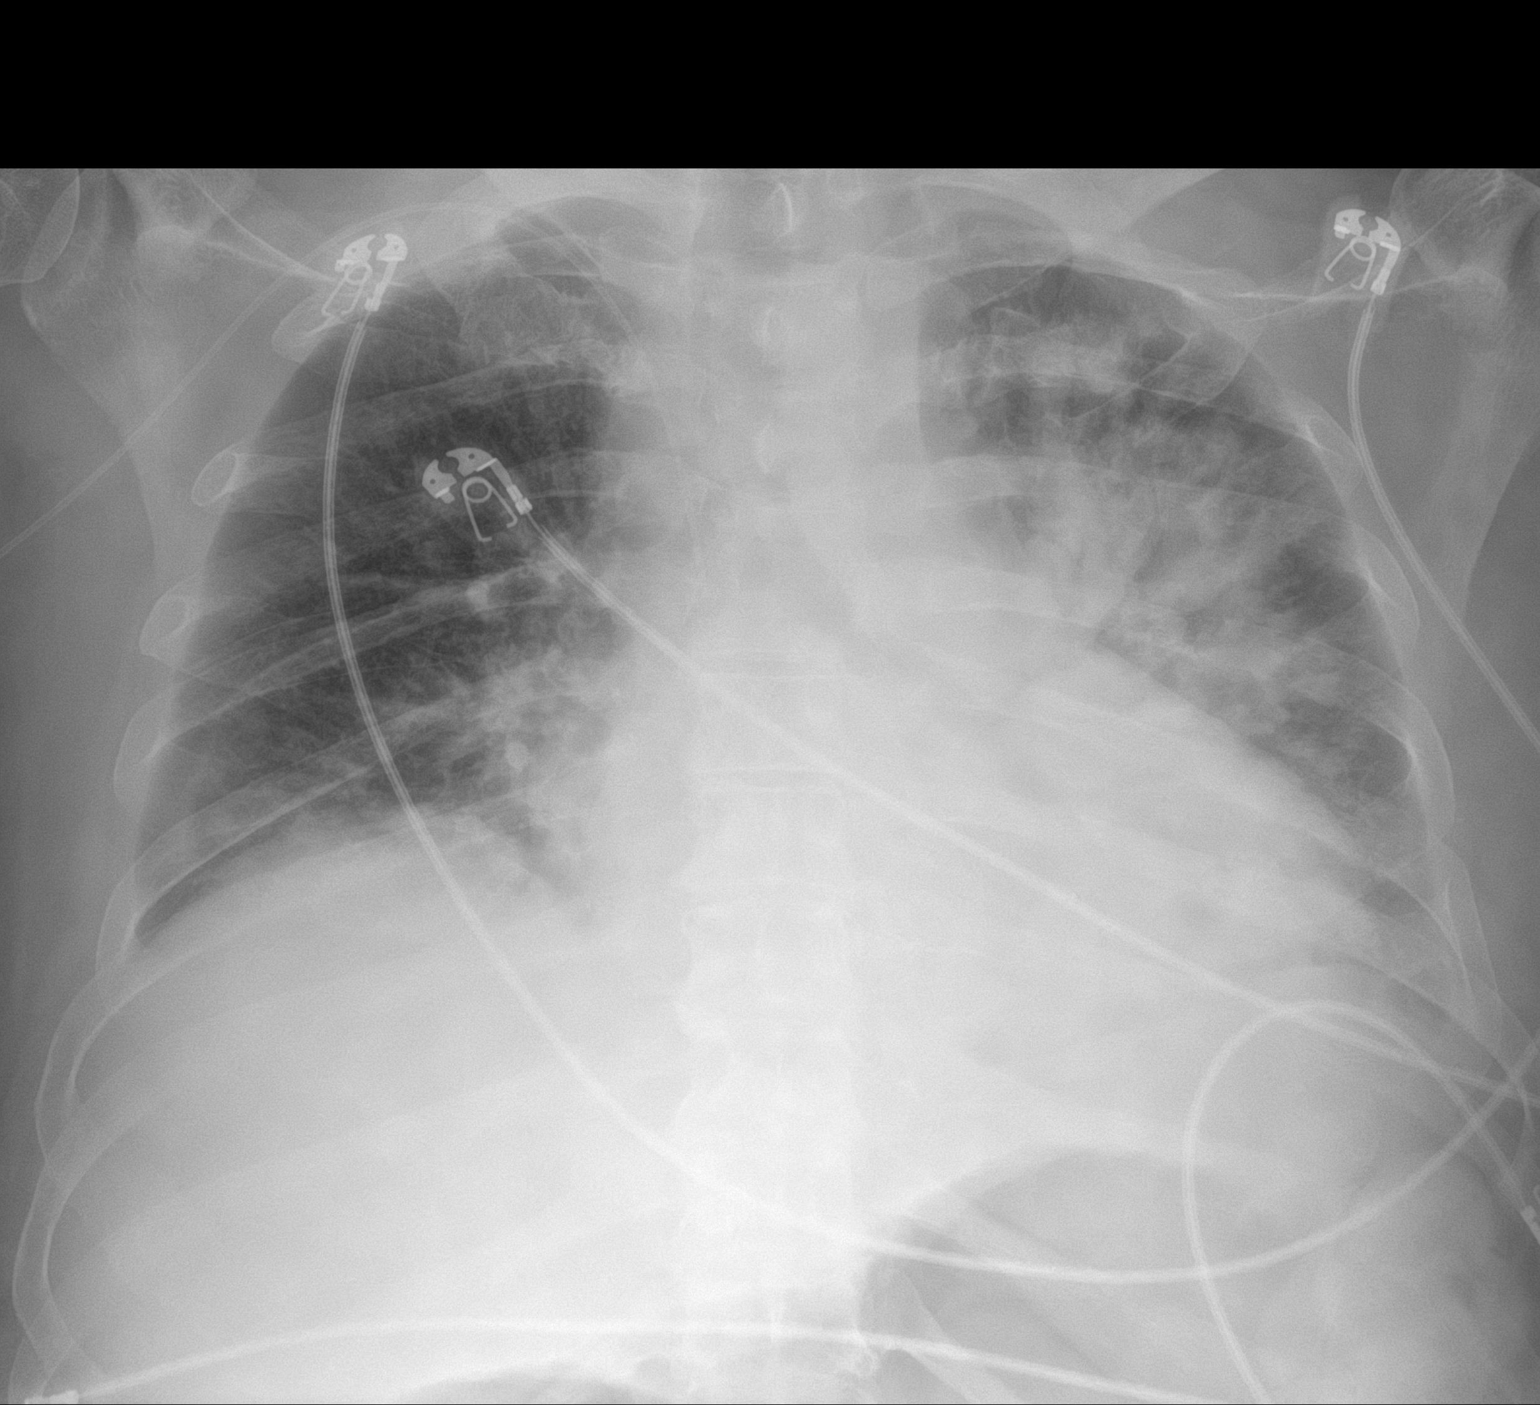

[1 of 1 positions shown; findings below may reference images not displayed]

FINDINGS: Cardiac shadow is enlarged but accentuated by the portable
technique. Right PICC line is again seen and stable. Bilateral
airspace opacities are noted left considerably greater than right
similar to that seen on the prior exam. No bony abnormality is
noted.
IMPRESSION: No change from the prior study.

## 2021-08-11 MED ORDER — DEXTROSE 50 % IV SOLN
12.5000 g | INTRAVENOUS | Status: AC
  Administered 2021-08-11: 12.5 g via INTRAVENOUS

## 2021-08-11 MED ORDER — SODIUM CHLORIDE 0.9 % IV SOLN
3.0000 g | Freq: Four times a day (QID) | INTRAVENOUS | Status: DC
  Administered 2021-08-11 – 2021-08-15 (×17): 3 g via INTRAVENOUS
  Filled 2021-08-11 (×17): qty 8

## 2021-08-11 MED ORDER — CHLORHEXIDINE GLUCONATE 0.12 % MT SOLN
15.0000 mL | Freq: Two times a day (BID) | OROMUCOSAL | Status: DC
  Administered 2021-08-12 (×3): 15 mL via OROMUCOSAL

## 2021-08-11 MED ORDER — DIAZEPAM 5 MG/ML IJ SOLN
2.5000 mg | Freq: Four times a day (QID) | INTRAMUSCULAR | Status: DC | PRN
  Administered 2021-08-11: 2.5 mg via INTRAVENOUS
  Filled 2021-08-11: qty 2

## 2021-08-11 MED ORDER — MAGNESIUM SULFATE 2 GM/50ML IV SOLN
2.0000 g | Freq: Once | INTRAVENOUS | Status: AC
  Administered 2021-08-11: 2 g via INTRAVENOUS
  Filled 2021-08-11: qty 50

## 2021-08-11 MED ORDER — HYDRALAZINE HCL 20 MG/ML IJ SOLN
10.0000 mg | Freq: Four times a day (QID) | INTRAMUSCULAR | Status: DC | PRN
  Administered 2021-08-11 – 2021-08-17 (×2): 10 mg via INTRAVENOUS
  Filled 2021-08-11 (×2): qty 1

## 2021-08-11 MED ORDER — ORAL CARE MOUTH RINSE
15.0000 mL | Freq: Two times a day (BID) | OROMUCOSAL | Status: DC
  Administered 2021-08-12 – 2021-08-14 (×5): 15 mL via OROMUCOSAL

## 2021-08-11 MED ORDER — INSULIN DETEMIR 100 UNIT/ML ~~LOC~~ SOLN
10.0000 [IU] | Freq: Two times a day (BID) | SUBCUTANEOUS | Status: DC
  Filled 2021-08-11 (×3): qty 0.1

## 2021-08-11 MED ORDER — NALOXEGOL OXALATE 25 MG PO TABS
25.0000 mg | ORAL_TABLET | Freq: Every day | ORAL | Status: DC
  Administered 2021-08-11: 25 mg via ORAL
  Filled 2021-08-11 (×2): qty 1

## 2021-08-11 MED ORDER — FUROSEMIDE 10 MG/ML IJ SOLN
40.0000 mg | Freq: Once | INTRAMUSCULAR | Status: AC
  Administered 2021-08-11: 40 mg via INTRAVENOUS
  Filled 2021-08-11: qty 4

## 2021-08-11 MED ORDER — POTASSIUM CHLORIDE CRYS ER 20 MEQ PO TBCR
40.0000 meq | EXTENDED_RELEASE_TABLET | Freq: Two times a day (BID) | ORAL | Status: DC
  Administered 2021-08-11: 40 meq via ORAL
  Filled 2021-08-11 (×2): qty 2

## 2021-08-11 MED ORDER — FENTANYL CITRATE (PF) 100 MCG/2ML IJ SOLN
50.0000 ug | INTRAMUSCULAR | Status: DC | PRN
  Administered 2021-08-11 – 2021-08-12 (×3): 50 ug via INTRAVENOUS
  Filled 2021-08-11 (×3): qty 2

## 2021-08-11 MED ORDER — DEXTROSE 50 % IV SOLN: 12.5000 g | INTRAVENOUS | Status: AC

## 2021-08-11 MED ORDER — INSULIN ASPART 100 UNIT/ML IJ SOLN
0.0000 [IU] | INTRAMUSCULAR | Status: DC
  Administered 2021-08-11: 4 [IU] via SUBCUTANEOUS
  Administered 2021-08-12: 3 [IU] via SUBCUTANEOUS
  Administered 2021-08-12: 7 [IU] via SUBCUTANEOUS
  Administered 2021-08-12: 4 [IU] via SUBCUTANEOUS
  Administered 2021-08-12 – 2021-08-13 (×3): 7 [IU] via SUBCUTANEOUS
  Administered 2021-08-13 (×3): 4 [IU] via SUBCUTANEOUS
  Administered 2021-08-14: 7 [IU] via SUBCUTANEOUS
  Administered 2021-08-14 (×3): 15 [IU] via SUBCUTANEOUS
  Administered 2021-08-14: 11 [IU] via SUBCUTANEOUS
  Administered 2021-08-14: 15 [IU] via SUBCUTANEOUS
  Administered 2021-08-14: 11 [IU] via SUBCUTANEOUS
  Administered 2021-08-15 (×2): 3 [IU] via SUBCUTANEOUS
  Administered 2021-08-15: 4 [IU] via SUBCUTANEOUS
  Administered 2021-08-15: 3 [IU] via SUBCUTANEOUS
  Administered 2021-08-15: 7 [IU] via SUBCUTANEOUS
  Administered 2021-08-15 – 2021-08-16 (×2): 4 [IU] via SUBCUTANEOUS
  Administered 2021-08-16 (×2): 3 [IU] via SUBCUTANEOUS
  Administered 2021-08-17: 7 [IU] via SUBCUTANEOUS
  Administered 2021-08-17: 11 [IU] via SUBCUTANEOUS
  Administered 2021-08-17 (×2): 7 [IU] via SUBCUTANEOUS
  Administered 2021-08-17: 3 [IU] via SUBCUTANEOUS
  Administered 2021-08-18 (×2): 7 [IU] via SUBCUTANEOUS
  Administered 2021-08-18: 4 [IU] via SUBCUTANEOUS
  Administered 2021-08-18 (×3): 7 [IU] via SUBCUTANEOUS
  Administered 2021-08-19: 3 [IU] via SUBCUTANEOUS
  Administered 2021-08-19: 4 [IU] via SUBCUTANEOUS
  Administered 2021-08-19: 11 [IU] via SUBCUTANEOUS
  Administered 2021-08-19 – 2021-08-20 (×2): 7 [IU] via SUBCUTANEOUS
  Administered 2021-08-20: 3 [IU] via SUBCUTANEOUS
  Administered 2021-08-20: 11 [IU] via SUBCUTANEOUS
  Administered 2021-08-20: 12 [IU] via SUBCUTANEOUS
  Administered 2021-08-20 (×2): 4 [IU] via SUBCUTANEOUS
  Administered 2021-08-20 – 2021-08-21 (×2): 3 [IU] via SUBCUTANEOUS
  Administered 2021-08-21 (×2): 4 [IU] via SUBCUTANEOUS
  Administered 2021-08-22 (×2): 7 [IU] via SUBCUTANEOUS
  Administered 2021-08-22 (×2): 4 [IU] via SUBCUTANEOUS
  Administered 2021-08-23: 3 [IU] via SUBCUTANEOUS
  Administered 2021-08-23: 7 [IU] via SUBCUTANEOUS
  Administered 2021-08-24: 3 [IU] via SUBCUTANEOUS
  Administered 2021-08-24: 7 [IU] via SUBCUTANEOUS
  Administered 2021-08-24: 3 [IU] via SUBCUTANEOUS
  Administered 2021-08-24 – 2021-08-25 (×3): 4 [IU] via SUBCUTANEOUS
  Administered 2021-08-25: 7 [IU] via SUBCUTANEOUS
  Administered 2021-08-25: 3 [IU] via SUBCUTANEOUS
  Administered 2021-08-25 (×2): 4 [IU] via SUBCUTANEOUS
  Administered 2021-08-26 (×2): 3 [IU] via SUBCUTANEOUS
  Administered 2021-08-26: 4 [IU] via SUBCUTANEOUS
  Administered 2021-08-26 (×2): 7 [IU] via SUBCUTANEOUS
  Administered 2021-08-26: 4 [IU] via SUBCUTANEOUS
  Administered 2021-08-27: 7 [IU] via SUBCUTANEOUS
  Administered 2021-08-27 (×3): 4 [IU] via SUBCUTANEOUS
  Administered 2021-08-28: 7 [IU] via SUBCUTANEOUS
  Administered 2021-08-28: 15 [IU] via SUBCUTANEOUS
  Administered 2021-08-28 (×2): 11 [IU] via SUBCUTANEOUS
  Administered 2021-08-28: 4 [IU] via SUBCUTANEOUS
  Administered 2021-08-28 – 2021-08-29 (×2): 3 [IU] via SUBCUTANEOUS
  Administered 2021-08-29: 11 [IU] via SUBCUTANEOUS
  Administered 2021-08-29: 3 [IU] via SUBCUTANEOUS
  Administered 2021-08-29: 7 [IU] via SUBCUTANEOUS
  Administered 2021-08-30 (×2): 4 [IU] via SUBCUTANEOUS

## 2021-08-11 MED ORDER — SODIUM CHLORIDE 0.9 % IV SOLN: 2.0000 g | Freq: Two times a day (BID) | INTRAVENOUS | Status: DC

## 2021-08-11 MED ORDER — SODIUM CHLORIDE 0.9% IV SOLUTION: Freq: Once | INTRAVENOUS | Status: AC

## 2021-08-11 MED ORDER — DEXTROSE 50 % IV SOLN
INTRAVENOUS | Status: AC
  Administered 2021-08-11: 12.5 g via INTRAVENOUS
  Filled 2021-08-11: qty 50

## 2021-08-11 MED ORDER — LIDOCAINE 5 % EX PTCH
1.0000 | MEDICATED_PATCH | CUTANEOUS | Status: DC
  Administered 2021-08-11 – 2021-08-12 (×2): 1 via TRANSDERMAL
  Filled 2021-08-11 (×2): qty 1

## 2021-08-11 MED ORDER — DEXTROSE 50 % IV SOLN
INTRAVENOUS | Status: AC
  Filled 2021-08-11: qty 50

## 2021-08-11 MED ORDER — BISACODYL 10 MG RE SUPP
10.0000 mg | Freq: Once | RECTAL | Status: AC
  Administered 2021-08-11: 10 mg via RECTAL
  Filled 2021-08-11: qty 1

## 2021-08-11 NOTE — Care Plan (Addendum)
Patient was upgraded to ICU yesterday for worsening respiratory failure.  Patient has developed significant respiratory distress post EGD requiring high flow nasal cannula.  Patient remains under ICU care.  We will sign off.

## 2021-08-11 NOTE — Progress Notes (Addendum)
I have seen and examined the patient. I have personally reviewed the clinical findings, laboratory findings, microbiological data and imaging studies. The assessment and treatment plan was discussed with the Nurse Practitioner Mauricio Po. I agree with her/his recommendations except following additions/corrections.  Patient moved to ICU later in the day in the setting of worsening resp distress On 15 L [place] and NRB mask today Awake, alert and oriented this morning  Complains of rt sided chest pain WBC 24.9, HB 6.6   10/14 OR cultures with MSSA ( same sensi as blood cx on 9/28) Chest xray with Extensive airspace opacity throughout the left lung field. CT head done for confusion unremarkable for acute changes  Recommend to de-escalate vancomycin and zosyn to Unasyn ( MRSA PCR negative and possibly related to MSSA) Sputum cultures if able May need to consider CT chest for need of thoracentesis - defer to PCCM Monitor CBC, BMP  Transfusion/respiratory status/supportive management per primary Following   Jose Oz, MD Infectious Disease Physician [facility] [address] [place], [place] [address] Phone: [phone]  Fax: [facility]  Date of Admission:  08/06/2021     Total days of antibiotics 19         ASSESSMENT:  Jose [name] surgical specimens are growing MSSA. Course now complicated by development of worsening respiratory distress requiring ICU level care and Bipap. Given concern for developing pneumonia will change antibiotics to Unasyn. MRSA PCR negative making MRSA infection less likely and he does not require coverage of Pseudomonas. EGD revealed severe esophagitis and duodenal ulcer which is being managed with PPIs. Remains critically ill. Plan discussed with wife at bedside. Continue emergent and supportive care per CCM. ID will continue to follow.    PLAN:  Change antibiotics to Unasyn. Emergent and supportive care per CCM.  Wound care per Dr. Kathyrn Sheriff. ID will continue to follow.   Principal Problem:   Osteomyelitis of thoracic spine (HCC) Active Problems:   Type 2 diabetes mellitus with diabetic polyneuropathy, without long-term current use of insulin (HCC)   Hyperlipidemia   GAD (generalized anxiety disorder)   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)   Essential hypertension   CKD (chronic kidney disease) stage 3, GFR 30-59 ml/min (HCC)   Pleural effusion, bilateral    (feeding supplement) PROSource Plus  30 mL Oral BID BM   sodium chloride   Intravenous Once   amLODipine  10 mg Oral Daily   baclofen  10 mg Oral TID   bisacodyl  10 mg Rectal Once   Chlorhexidine Gluconate Cloth  6 each Topical Daily   feeding supplement  1 Container Oral TID BM   feeding supplement (GLUCERNA SHAKE)  237 mL Oral TID BM   gabapentin  300 mg Oral BID   guaiFENesin  600 mg Oral BID   hydrALAZINE  50 mg Oral TID   insulin aspart  0-20 Units Subcutaneous Q4H   insulin detemir  10 Units Subcutaneous BID   lactulose  20 g Oral BID   living well with diabetes book   Does not apply Once   multivitamin with minerals  1 tablet Oral Daily   mupirocin ointment  1 application Topical BID   oxyCODONE  15 mg Oral Q12H   pantoprazole (PROTONIX) IV  40 mg Intravenous Q12H   polyethylene glycol  17 g Oral TID   potassium chloride  40 mEq Oral BID  senna-docusate  1 tablet Oral BID   sodium chloride flush  10-40 mL Intracatheter Q12H   sodium chloride flush  3 mL Intravenous Q12H   traZODone  100 mg Oral QHS    SUBJECTIVE:  Afebrile overnight. Moved to the ICU for increasing respiratory distress, tachycardia and generalized distress. CT head with no acute findings. Continues to have right sided chest pain that is worse after going for CT scan. Placed on Bipap for respiratory support.   Allergies  Allergen Reactions   Cranberry Itching      Review of Systems: Review of Systems  Constitutional:  Negative for chills, fever and weight loss.  Respiratory:  Positive for shortness of breath. Negative for cough and wheezing.   Cardiovascular:  Positive for chest pain. Negative for leg swelling.  Gastrointestinal:  Negative for abdominal pain, constipation, diarrhea, nausea and vomiting.  Skin:  Negative for rash.  Psychiatric/Behavioral:  The patient is nervous/anxious.      OBJECTIVE: Vitals:   08/11/21 0900 08/11/21 1000 08/11/21 1100 08/11/21 1124  BP: (!) 170/86  136/74   Pulse: (!) 120 (!) 128 (!) 122   Resp: (!) 27 (!) 32 (!) 24   Temp:  98.5 F (36.9 C)  99.9 F (37.7 C)  TempSrc:  Oral  Axillary  SpO2: 93% 98% 98%   Weight:      Height:       Body mass index is 27.46 kg/m.  Physical Exam Constitutional:      General: He is not in acute distress.    Appearance: He is well-developed.     Comments: Lying in bed with head of bed elevated; non-rebreather in place  Cardiovascular:     Rate and Rhythm: Regular rhythm. Tachycardia present.     Heart sounds: Normal heart sounds.  Pulmonary:     Effort: Respiratory distress present.     Breath sounds: Normal breath sounds.  Skin:    General: Skin is warm and dry.  Neurological:     Mental Status: He is alert and oriented to person, place, and time.    Lab Results Lab Results  Component Value Date   WBC 24.9 (H) 08/11/2021   HGB 6.6 (LL) 08/11/2021   HCT 20.3 (L) 08/11/2021   MCV 87.1 08/11/2021   PLT 464 (H) 08/11/2021    Lab Results  Component Value Date   CREATININE 1.92 (H) 08/11/2021   BUN 60 (H) 08/11/2021   NA 133 (L) 08/11/2021   K 3.6 08/11/2021   CL 103 08/11/2021   CO2 22 08/11/2021    Lab Results  Component Value Date   ALT 6 08/09/2021   AST 13 (L) 08/09/2021   ALKPHOS 122 08/09/2021   BILITOT 0.9 08/09/2021     Microbiology: Recent Results (from the past 240 hour(s))  Culture, blood (routine x 2)     Status: None    Collection Time: 08/06/21  9:10 AM   Specimen: BLOOD  Result Value Ref Range Status   Specimen Description BLOOD SITE NOT SPECIFIED  Final   Special Requests   Final    BOTTLES DRAWN AEROBIC AND ANAEROBIC Blood Culture results may not be optimal due to an inadequate volume of blood received in culture bottles   Culture   Final    NO GROWTH 5 DAYS Performed at [facility], [place] [address] [place], [place] [address]    Report Status 08/11/2021 FINAL  Final  Resp Panel by RT-PCR (Flu A&B, Covid) Nasopharyngeal Swab  Status: Abnormal   Collection Time: 08/06/21  9:36 AM   Specimen: Nasopharyngeal Swab; Nasopharyngeal(NP) swabs in vial transport medium  Result Value Ref Range Status   SARS Coronavirus 2 by RT PCR POSITIVE (A) NEGATIVE Final    Comment: rn sarah b. [id number] [id number] (NOTE) SARS-CoV-2 target nucleic acids are DETECTED.  The SARS-CoV-2 RNA is generally detectable in upper respiratory specimens during the acute phase of infection. Positive results are indicative of the presence of the identified virus, but do not rule out bacterial infection or co-infection with other pathogens not detected by the test. Clinical correlation with patient history and other diagnostic information is necessary to determine patient infection status. The expected result is Negative.  Fact Sheet for Patients: EntrepreneurPulse.com.au  Fact Sheet for Healthcare Providers: IncredibleEmployment.be  This test is not yet approved or cleared by the [place] FDA and  has been authorized for detection and/or diagnosis of SARS-CoV-2 by FDA under an Emergency Use Authorization (EUA).  This EUA will remain in effect (meaning this test can be used) for the duration of  the COVID-19 declaration und er Section 564(b)(1) of the Act, 21 U.S.C. section 360bbb-3(b)(1), unless the authorization is terminated or revoked sooner.     Influenza A by PCR NEGATIVE  NEGATIVE Final   Influenza B by PCR NEGATIVE NEGATIVE Final    Comment: (NOTE) The Xpert Xpress SARS-CoV-2/FLU/RSV plus assay is intended as an aid in the diagnosis of influenza from Nasopharyngeal swab specimens and should not be used as a sole basis for treatment. Nasal washings and aspirates are unacceptable for Xpert Xpress SARS-CoV-2/FLU/RSV testing.  Fact Sheet for Patients: EntrepreneurPulse.com.au  Fact Sheet for Healthcare Providers: IncredibleEmployment.be  This test is not yet approved or cleared by the [place] FDA and has been authorized for detection and/or diagnosis of SARS-CoV-2 by FDA under an Emergency Use Authorization (EUA). This EUA will remain in effect (meaning this test can be used) for the duration of the COVID-19 declaration under Section 564(b)(1) of the Act, 21 U.S.C. section 360bbb-3(b)(1), unless the authorization is terminated or revoked.  Performed at [facility], [place] [address], [place], [place] [address]   Culture, blood (routine x 2)     Status: None   Collection Time: 08/06/21  9:37 AM   Specimen: BLOOD RIGHT HAND  Result Value Ref Range Status   Specimen Description BLOOD RIGHT HAND  Final   Special Requests   Final    BOTTLES DRAWN AEROBIC AND ANAEROBIC Blood Culture adequate volume   Culture   Final    NO GROWTH 5 DAYS Performed at [facility], [place] [address], [place], [place] [address]    Report Status 08/11/2021 FINAL  Final  Urine Culture     Status: None   Collection Time: 08/06/21  1:50 PM   Specimen: Urine, Clean Catch  Result Value Ref Range Status   Specimen Description URINE, CLEAN CATCH  Final   Special Requests NONE  Final   Culture   Final    NO GROWTH Performed at [facility], [place] [address] [place], [place] [address]    Report Status 08/07/2021 FINAL  Final  Surgical pcr screen     Status: None   Collection Time: 08/06/21  7:45 PM   Specimen: Nasal  Mucosa; Nasal Swab  Result Value Ref Range Status   MRSA, PCR NEGATIVE NEGATIVE Final   Staphylococcus aureus NEGATIVE NEGATIVE Final    Comment: (NOTE) The Xpert SA Assay (  FDA approved for NASAL specimens in patients 72 years of age and older), is one component of a comprehensive surveillance program. It is not intended to diagnose infection nor to guide or monitor treatment. Performed at [facility], [place] [address], [place], [place] [address]   Aerobic/Anaerobic Culture w Gram Stain (surgical/deep wound)     Status: None (Preliminary result)   Collection Time: 08/07/21  5:04 PM   Specimen: PATH Other; Tissue  Result Value Ref Range Status   Specimen Description ABSCESS  Final   Special Requests EPIDURAL SWABS PT ON ANCEF VANC MAXIPIME  Final   Gram Stain   Final    NO WBC SEEN NO ORGANISMS SEEN Performed at [facility], [place] [address], [place], [place] [address]    Culture   Final    RARE STAPHYLOCOCCUS AUREUS NO ANAEROBES ISOLATED; CULTURE IN PROGRESS FOR 5 DAYS    Report Status PENDING  Incomplete   Organism ID, Bacteria STAPHYLOCOCCUS AUREUS  Final      Susceptibility   Staphylococcus aureus - MIC*    CIPROFLOXACIN <=0.5 SENSITIVE Sensitive     ERYTHROMYCIN RESISTANT Resistant     GENTAMICIN <=0.5 SENSITIVE Sensitive     OXACILLIN <=0.25 SENSITIVE Sensitive     TETRACYCLINE <=1 SENSITIVE Sensitive     VANCOMYCIN 1 SENSITIVE Sensitive     TRIMETH/SULFA <=10 SENSITIVE Sensitive     CLINDAMYCIN RESISTANT Resistant     RIFAMPIN <=0.5 SENSITIVE Sensitive     Inducible Clindamycin POSITIVE Resistant     * RARE STAPHYLOCOCCUS AUREUS  Aerobic/Anaerobic Culture w Gram Stain (surgical/deep wound)     Status: None (Preliminary result)   Collection Time: 08/07/21  5:05 PM   Specimen: PATH Other; Tissue  Result Value Ref Range Status   Specimen Description TISSUE  Final   Special Requests EPIDURAL PT ON VANC MAXIPIME ANCEF  Final   Gram Stain   Final    NO WBC  SEEN NO ORGANISMS SEEN Performed at [facility], [address], [place],  [address]    Culture   Final    RARE STAPHYLOCOCCUS AUREUS SUSCEPTIBILITIES PERFORMED ON PREVIOUS CULTURE WITHIN THE LAST 5 DAYS. RESULT CALLED TO, READ BACK BY AND VERIFIED WITH: RN L.ILLERBRUN ON AS:[id number] AT 42 BY E.PARRISH NO ANAEROBES ISOLATED; CULTURE IN PROGRESS FOR 5 DAYS    Report Status PENDING  Incomplete     Terri Piedra, NP [facility]  08/11/2021  11:29 AM

## 2021-08-11 NOTE — Progress Notes (Addendum)
Inpatient Diabetes Program Recommendations  AACE/ADA: New Consensus Statement on Inpatient Glycemic Control (2015)  Target Ranges:  Prepandial:   less than 140 mg/dL      Peak postprandial:   less than 180 mg/dL (1-2 hours)      Critically ill patients:  140 - 180 mg/dL   Lab Results  Component Value Date   GLUCAP 165 (H) 08/11/2021   HGBA1C 7.5 (A) 06/11/2021    Review of Glycemic Control Results for Jose [name], Jose [name] (MRN [id number]) as of 08/11/2021 10:46  Ref. Range 08/10/2021 16:19 08/10/2021 19:53 08/10/2021 21:29 08/11/2021 07:34  Glucose-Capillary Latest Ref Range: 70 - 99 mg/dL 350 (H) 250 (H) 245 (H) 165 (H)   Diabetes history: Type 2 DM Outpatient Diabetes medications: Trulicity 3 mg qwk, Amaryl 1 mg QD Current orders for Inpatient glycemic control: Novolog 0-20 units Q4H, Levemir 15 units BID  Inpatient Diabetes Program Recommendations:    Consider changing Levemir to QD. Concern for possible lows with BID dosing and resistant correction. Secure chat sent.  Thanks, Bronson Curb, MSN, RNC-OB Diabetes Coordinator [phone] (8a-5p)

## 2021-08-11 NOTE — Progress Notes (Signed)
[facility] Gastroenterology Progress Note  JAYLUN BLANCARTE 45 y.o. 1976-07-08  CC: Upper GI bleed   Subjective: Patient seen and examined at bedside.  Yesterday's events noted.  Developed respiratory failure probably from multifocal pneumonia.  Currently on BiPAP.  No overt bleeding noted.    Objective: Vital signs in last 24 hours: Vitals:   08/11/21 1359 08/11/21 1400  BP:  (!) 145/90  Pulse:  (!) 120  Resp:  (!) 23  Temp:    SpO2: 91% 95%    Physical Exam:  GEN : Resting but has mild respiratory distress Abdomen : Mild distended, bowel sounds present.  No peritoneal signs.  Abdomen is nontender   Lab Results: Recent Labs    08/10/21 0341 08/11/21 0308  NA 135 133*  K 4.1 3.6  CL 103 103  CO2 21* 22  GLUCOSE 286* 231*  BUN 58* 60*  CREATININE 2.34* 1.92*  CALCIUM 8.3* 8.3*  MG 2.0 1.9  PHOS 3.2 3.2   Recent Labs    08/09/21 0311  AST 13*  ALT 6  ALKPHOS 122  BILITOT 0.9  PROT 6.1*  ALBUMIN 1.7*   Recent Labs    08/11/21 0308 08/11/21 1350  WBC 24.9* 30.5*  HGB 6.6* 7.3*  HCT 20.3* 22.6*  MCV 87.1 86.9  PLT 464* 439*   No results for input(s): LABPROT, INR in the last 72 hours.    Assessment/Plan: - Coffee-ground emesis. EGD yesterday showed LA grade D esophagitis with black discoloration of mucosa.  Biopsies pending. -Hypoxic respiratory failure - Acute Blood loss anemia --T4-T5 osteomyelitis.S/P laminectomy as well as decompression of the spinal cord on August 07, 2021. -Constipation -most likely from narcotic use  Recommendations ------------------------ -Monitor H&H.  Transfuse if hemoglobin less than 7 -Continue IV twice daily PPI while in the hospital.  If ongoing drop in hemoglobin, consider adding Carafate -On discharge, recommend  twice daily PPI for at least 2 months, switch to once a day 40 mg PPI -Commend repeat EGD in 3 months to document healing of esophagitis -Okay to advance diet from GI standpoint -GI will sign off.  Call  us back if needed   Otis Brace MD, [place] 08/11/2021, 2:47 PM  Contact #  [phone]

## 2021-08-11 NOTE — Progress Notes (Signed)
Patient having improved mentation A&O and following all commands. Pt requiring progressively increasing O2 requirements, from 9L HFNC to 15L HFNC & NRB. When NRB mask removed with only 15L HFNC, Paitent's O2 Sat's between 87-90%. Breathing is regular, and unlabored. Patient complaining of chest pain and bilateral rib pain. EKG was done, and prn given with some relief. E-link notified, will continue to monitor closely.

## 2021-08-11 NOTE — Progress Notes (Signed)
OT Cancellation Note  Patient Details Name: JOSEY SCHOFF MRN: [id number] DOB: 14-Sep-1976   Cancelled Treatment:    Reason Eval/Treat Not Completed: Medical issues which prohibited therapy (pt currently with sats 93% on 15L HFNC and NRB with RN stating pt unable to tolerate any exertion with desaturation and chest pain).  Jolaine Artist, OT Acute Rehabilitation Services Pager [phone] Office [phone]   Delight Stare 08/11/2021, 8:10 AM

## 2021-08-11 NOTE — Progress Notes (Signed)
PT Cancellation Note  Patient Details Name: Jose [name] MRN: [id number] DOB: May 26, 1976   Cancelled Treatment:    Reason Eval/Treat Not Completed: Medical issues which prohibited therapy (pt currently with sats 93% on 15L HFNC and NRB with RN stating pt unable to tolerate any exertion with desaturation and chest pain)   Jose [name] 08/11/2021, 7:48 AM Bayard Males, PT Acute Rehabilitation Services Pager: [phone] Office: [phone]

## 2021-08-11 NOTE — Anesthesia Postprocedure Evaluation (Signed)
Anesthesia Post Note  Patient: KAYL STOGDILL  Procedure(s) Performed: ESOPHAGOGASTRODUODENOSCOPY (EGD) WITH PROPOFOL BIOPSY     Patient location during evaluation: PACU Anesthesia Type: MAC Level of consciousness: awake and alert Pain management: pain level controlled Vital Signs Assessment: post-procedure vital signs reviewed and stable Respiratory status: spontaneous breathing, nonlabored ventilation, respiratory function stable and patient connected to nasal cannula oxygen Cardiovascular status: stable and blood pressure returned to baseline Postop Assessment: no apparent nausea or vomiting Anesthetic complications: no   No notable events documented.  Last Vitals:  Vitals:   08/11/21 1946 08/11/21 2103  BP:  (!) 150/83  Pulse:  (!) 125  Resp:  (!) 31  Temp: 36.6 C   SpO2:  94%    Last Pain:  Vitals:   08/11/21 1946  TempSrc: Oral  PainSc:                  Tiajuana Amass

## 2021-08-11 NOTE — Progress Notes (Signed)
NAME:  Jose [name], MRN:  [id number], DOB:  July 09, 1976, LOS: 5 ADMISSION DATE:  08/06/2021, CONSULTATION DATE:  08/10/2021 REFERRING MD:  TRH, CHIEF COMPLAINT:  Hypoxia   Brief History   Jose [name] is a 45 y.o. 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 On 10/17 patient developed coffee-ground emesis, now s/p EGD showing circumferential esophagitis with hemorrhage and non-bleeding duodenal ulcers now on BID PPI. On 10/17, pccm was consulted for acute hypoxia.   Past Medical History   has a past medical history of GAD (generalized anxiety disorder), Hyperlipidemia, Macular degeneration, bilateral, Retinopathy, and Type II diabetes mellitus with complication, uncontrolled. Significant Hospital Events   10/13 -admitted with worsening thoracic osteomyelitis, constipation/abd distention, neurosurgery consulted  10/14 - T4-T5 laminectomy, left T5 pediculotomy, and spinal cord decompression 10/17 - Hgb drop to 6.2, coffee ground emesis s/p EGD with esophagitis and peptic ulcer 10/17 - hypoxia and pccm consult   Consults:  Neurosurgery Infection disease PCCM  Procedures:  10/14 - T4-T5 laminectomy, left T5 pediculotomy, and spinal cord decompression 10/17 - EGD  Significant Diagnostic Tests:  CT Abd/Pelvis Result Date: 08/06/2021 IMPRESSION: 1. Generally large burden of stool and stool balls throughout the colon. 2. Distended urinary bladder, measuring up to 19.0 cm. Correlate for urinary retention. 3. Anasarca.  CTA: Result Date: 08/06/2021 1. Negative examination for pulmonary embolism. 2.  Moderate bilateral pleural effusions and associated atelectasis or consolidation, new compared to prior examination. 3. There is substantial interval worsening of destructive lytic change about the T4-T5 disc space. Overlying paravertebral soft tissue thickening. Findings are consistent with worsened discitis osteomyelitis but in general better assessed by same-day MR. 4. Unchanged enlarged right hilar and subcarinal lymph nodes, likely reactive. 5. Cardiomegaly and coronary artery disease.  MR Thoracic Spine: Result Date: 08/06/2021 1. Redemonstrated abnormal bone marrow signal and enhancement involving the T4 and T5 levels with increased surrounding paraspinal phlegmon and new epidural phlegmon (6 mm thick ventrally). Interval T5 pathologic fracture (50-60% height loss) with new 4 mm of bony retropulsion. Constellation of findings results in new moderate to severe left eccentric canal stenosis at T4 with cord deformity. 2. Overall, findings are most likely secondary to progressive discitis/osteomyelitis. Superimposed osseous metastatic disease or multiple myeloma is not excluded by imaging. Recommend correlation with the results of reported recent biopsy. 3. Multiple small (subcentimeter) fluid collections in the left posterior paraspinal phlegmon could represent tiny abscesses  DG CHEST PORT 1 VIEW Result Date: 08/10/2021 IMPRESSION: Extensive airspace opacity throughout the left lung field. Comparatively mild opacity within the right lung base. Given the asymmetry, these findings likely reflect multifocal pneumonia. Small left pleural effusion.   EGD: Result Date: 08/10/2021 LA Grade D (one or more mucosal breaks involving at least 75% of esophageal circumference) esophagitis with no bleeding was found in the entire esophagus. Biopsies were taken with a cold forceps for histology. Findings: Hematin (altered blood/coffee-ground-like material) was found in the gastric body. No gross lesions  were noted in the entire examined stomach. Biopsies were taken with a cold forceps for histology. Few non-bleeding superficial duodenal ulcers with no stigmata of bleeding were found in the duodenal bulb. The largest lesion was 15 mm in  largest dimension. Biopsies were taken with a cold forceps for histology. The second portion of the duodenum was normal.  CT HEAD WO CONTRAST (5MM) Result Date: 08/11/2021 IMPRESSION: No acute intracranial process.   DG CHEST PORT 1 VIEW Result Date: 08/11/2021 IMPRESSION: No change from the prior study.      Micro Data:  10/13 - Bcx no growth x4d 10/13 - Ucx no growth   Antimicrobials:  10/13-10/13 Cefepime   10/13 Cefazolin> 10/17-10/17 Zosyn 10/17 Vancomycin>  Interim History/Subjective:  Patient resting in bed. He states that he is feeling a little better than yesterday, but continues to have significant chest pain and tachypnea. Otherwise, he denies any significant symptoms. Denies any further episodes of hematemesis.   Objective:  Blood pressure (!) 142/86, pulse (!) 116, temperature 99.3 F (37.4 C), temperature source Oral, resp. rate (!) 21, height _0  (1.803 m), weight 89.3 kg, SpO2 91 %.        Intake/Output Summary (Last 24 hours) at 08/11/2021 0545 Last data filed at 08/11/2021 0258 Gross per 24 hour  Intake 1849.76 ml  Output 2525 ml  Net -675.24 ml   Filed Weights   08/06/21 0847 08/07/21 0628 08/09/21 0502  Weight: 86.2 kg 89.6 kg 89.3 kg    Examination: General: alert and orients and mild to moderate distress HEENT: PERRLA, EOM intact Lungs: Diffuse rales throughout and shallow breath sounds Cardiovascular: Tachycardic with regular rhythm, no murmurs rubs or gallops  Abdomen: distended with decreased bowel sounds, but nontender Extremities: warm and dry, normal pulse, and trace edema Neuro: no focal deficits  GU: n/a  Patient Lines/Drains/Airways Status     Active Line/Drains/Airways     Name Placement  date Placement time Site Days   Peripheral IV 08/07/21 18 G Left Hand 08/07/21  1534  Hand  4   PICC Single Lumen 07/31/21 Right Brachial 40 cm 0 cm 07/31/21  1034  Brachial  11   Incision (Closed) 08/07/21 Back 08/07/21  1803  -- 4   Pressure Injury 08/10/21 Coccyx Medial Stage 2 -  Partial thickness loss of dermis presenting as a shallow open injury with a red, pink wound bed without slough. 08/10/21  1700  -- 1   Wound / Incision (Open or Dehisced) 07/22/21 Diabetic ulcer Toe (Comment  which one) Left Big toe ulcer 07/22/21  --  Toe (Comment  which one)  20            Resolved Hospital Problem list     Assessment & Plan:   Acute acute hypoxic respiratory failure  S/p decompression of T4-5 osteodiscitis with extensive pain Hx of COVID-19 infection, 1 month ago Moderate Bilateral Pleural Effusions  Patient has been on HHFN at 12-15L/min with spO2 of 95-99% overnight. He has remained afebrile overnight on broad spectrum AB. He has a leukocytosis of 23.2 CXR performed yesterday shows bilateral airspace opacities with left greater than right and persistent pleural effusions.  - Supplemental O2 to keep SpO2 >92% - Pain and anxiety management seen below  - Encourage incentive sprio - Consider thoracentesis for persistent respiratory failure.  Esophagitis with hematemesis Duodenal ulcer:  - Continue to have chest pain from esophagitis, consider sucralfate   -On SCDS - On PPI 40 mg BID - Hgb this morning of 6.2 and reeving a unit of PRBC - Post transfusion CBC pending - SCDs for DVT ppx.  Status post thoracic laminectomy Patient is status post T4-T5 laminectomy. Neurosurgery is consulted. He continues to have pain.  -  Pain management with scheduled oxy and prn fentanyl for severe pain  - Baclofen for spasms   Osteomyelitis/MSSA bacteremia Patient is on day 5 of antibiotic therapy with cefazolin and recently broadened to vancomycin and Zosyn.  Infectious disease is following. -   Per ID  Diabetes mellitus with hyperglycemia  Patient continues to have elevated CBGs in the mid to high 200's on SSI and levemir 15 units BID  - Will increase correction factor of SSI  - Consider increasing long acting  - CBG monitoring    Anxiety disorder this may be exacerbating his pain - Continue Alprazolam 0.25 mg bid prn  - Continue trazodone 100 mg qhs    Best Practice:  Diet: Regular consistency (see orders) Pain/Anxiety/Delirium protocol (if indicated): yes, fentanyl and oxy VAP protocol (if indicated): no DVT prophylaxis: SCD GI prophylaxis: PPI Glucose control: SSI Yes and Basal insulin Yes Lines: N/A Foley:  N/A Mobility: bed rest, Code Status: full code Family Communication: pending Disposition: ICU  Labs   CBC: Recent Labs  Lab 08/06/21 0937 08/07/21 0447 08/07/21 1553 08/09/21 0311 08/09/21 1438 08/10/21 0341 08/10/21 1510 08/11/21 0308  WBC 13.1* 10.0  --  24.8*  --  23.2*  --  24.9*  NEUTROABS 10.0*  --   --   --   --   --   --   --   HGB 8.4* 7.8*   < > 8.1* 7.9* 6.2* 7.1* 6.6*  HCT 26.1* 23.9*   < > 24.8* 24.6* 19.6* 21.7* 20.3*  MCV 85.9 84.2  --  86.7  --  87.9  --  87.1  PLT 518* 500*  --  492*  --  522*  --  464*   < > = values in this interval not displayed.    Basic Metabolic Panel: Recent Labs  Lab 08/06/21 0937 08/07/21 0447 08/07/21 1553 08/09/21 0311 08/10/21 0341 08/11/21 0308  NA 134* 133* 137 134* 135 133*  K 4.6 4.2 4.8 4.7 4.1 3.6  CL 101 102  --  100 103 103  CO2 20* 24  --  20* 21* 22  GLUCOSE 167* 275*  --  247* 286* 231*  BUN 21* 19  --  35* 58* 60*  CREATININE 1.50* 1.47*  --  2.48* 2.34* 1.92*  CALCIUM 9.4 9.0  --  8.5* 8.3* 8.3*  MG  --   --   --  2.0 2.0 1.9  PHOS  --   --   --  4.6 3.2 3.2   GFR: Estimated Creatinine Clearance: 50.7 mL/min (A) (by C-G formula based on SCr of 1.92 mg/dL (H)). Recent Labs  Lab 08/06/21 0937 08/06/21 1828 08/07/21 0447 08/09/21 0311 08/10/21 0341 08/11/21 0308  WBC  13.1*  --  10.0 24.8* 23.2* 24.9*  LATICACIDVEN 0.6 0.5  --   --   --   --     Liver Function Tests: Recent Labs  Lab 08/06/21 0937 08/07/21 0447 08/09/21 0311  AST 17 13* 13*  ALT <5 <5 6  ALKPHOS 163* 145* 122  BILITOT 0.5 0.6 0.9  PROT 7.0 6.1* 6.1*  ALBUMIN 2.0* 1.7* 1.7*   Recent Labs  Lab 08/06/21 0937  LIPASE 21   No results for input(s): AMMONIA in the last 168 hours.  ABG    Component Value Date/Time   PHART 7.401 08/10/2021 1611   PCO2ART 32.6 08/10/2021 1611   PO2ART 69.8 (L) 08/10/2021 1611   HCO3 19.9 (L) 08/10/2021 1611   TCO2 28 08/07/2021  1553   ACIDBASEDEF 4.1 (H) 08/10/2021 1611   O2SAT 93.7 08/10/2021 1611     Coagulation Profile: No results for input(s): INR, PROTIME in the last 168 hours.  Cardiac Enzymes: No results for input(s): CKTOTAL, CKMB, CKMBINDEX, TROPONINI in the last 168 hours.  HbA1C: Hemoglobin A1C  Date/Time Value Ref Range Status  06/11/2021 07:49 AM 7.5 (A) 4.0 - 5.6 % Final  03/10/2021 09:39 AM 7.0 (A) 4.0 - 5.6 % Final   Hgb A1c MFr Bld  Date/Time Value Ref Range Status  05/19/2021 04:31 AM 7.7 (H) 4.8 - 5.6 % Final    Comment:    (NOTE)         Prediabetes: 5.7 - 6.4         Diabetes: >6.4         Glycemic control for adults with diabetes: <7.0   11/22/2019 10:12 AM 7.2 (H) 4.8 - 5.6 % Final    Comment:             Prediabetes: 5.7 - 6.4          Diabetes: >6.4          Glycemic control for adults with diabetes: <7.0     CBG: Recent Labs  Lab 08/09/21 2109 08/10/21 1035 08/10/21 1619 08/10/21 1953 08/10/21 2129  GLUCAP 251* 346* 350* 250* 245*    Review of Systems:   See above  Past Medical History  He,  has a past medical history of GAD (generalized anxiety disorder), Hyperlipidemia, Macular degeneration, bilateral, Retinopathy, and Type II diabetes mellitus with complication, uncontrolled.   Surgical History    Past Surgical History:  Procedure Laterality Date   APPENDECTOMY     BUBBLE  STUDY  07/29/2021   Procedure: BUBBLE STUDY;  Surgeon: Jerline Pain, MD;  Location: [facility];  Service: Cardiovascular;;   HERNIA REPAIR     IR FLUORO GUIDED NEEDLE PLC ASPIRATION/INJECTION LOC  07/28/2021   LUMBAR LAMINECTOMY/DECOMPRESSION MICRODISCECTOMY N/A 08/07/2021   Procedure: THORACIC FOUR - THORACIC FIVE LAMINECTOMY/DECOMPRESSION OF SPINAL CORD, DEBRIDEMENT OF ABSCESS, MICRODISCECTOMY, INTRAOPERATIVE ULTRASOUND;  Surgeon: Consuella Lose, MD;  Location: [facility];  Service: Neurosurgery;  Laterality: N/A;   TEE WITHOUT CARDIOVERSION N/A 07/29/2021   Procedure: TRANSESOPHAGEAL ECHOCARDIOGRAM (TEE);  Surgeon: Jerline Pain, MD;  Location[facility];  Service: Cardiovascular;  Laterality: N/A;   TRIGGER FINGER RELEASE Right 10/25/2019   Procedure: RIGHT INDEX FINGER RELEASE TRIGGER FINGER/A-1 PULLEY;  Surgeon: Daryll Brod, MD;  Location: [facility];  Service: Orthopedics;  Laterality: Right;  IV REGIONAL FOREARM BLOCK     Social History   reports that he quit smoking about 12 months ago. His smoking use included cigarettes. He has a 20.00 pack-year smoking history. He has never used smokeless tobacco. He reports that he does not currently use alcohol. He reports that he does not use drugs.   Family History   His family history includes ADD / ADHD in his brother and son; Diabetes in his father and mother; Hyperlipidemia in his brother and mother; Stroke in his brother and mother.   Allergies Allergies  Allergen Reactions   Cranberry Itching     Home Medications  Prior to Admission medications   Medication Sig Start Date End Date Taking? Authorizing Provider  acetaminophen (TYLENOL) 500 MG tablet Take 500 mg by mouth every 6 (six) hours as needed for mild pain, fever or headache.   Yes [provider]  albuterol (VENTOLIN HFA) 108 (90 Base) MCG/ACT inhaler  Inhale 1 puff into the lungs every 4 (four) hours as needed for wheezing or shortness of breath.  05/27/21  Yes [provider]  ALPRAZolam (XANAX) 0.25 MG tablet Take 0.25 mg by mouth 2 (two) times daily as needed for anxiety. 06/12/21  Yes [provider]  amLODipine (NORVASC) 10 MG tablet Take 1 tablet (10 mg total) by mouth daily. 08/03/21  Yes Cristal Deer, MD  Ascorbic Acid (VITAMIN C PO) Take 1 tablet by mouth daily.   Yes [provider]  atorvastatin (LIPITOR) 40 MG tablet Take 1 tablet (40 mg total) by mouth daily. 03/12/19  Yes Jacelyn Pi, Lilia Argue, MD  ceFAZolin (ANCEF) IVPB Inject 2 g into the vein every 8 (eight) hours. Indication:  Osteomyelitis  First Dose: No Last Day of Therapy:  09/04/2022  Labs - Once weekly:  CBC/D and BMP, Labs - Every other week:  ESR and CRP Method of administration: IV Push Method of administration may be changed at the discretion of home infusion pharmacist based upon assessment of the patient and/or caregiver's ability to self-administer the medication ordered. 07/31/21  Yes Cristal Deer, MD  chlorproMAZINE (THORAZINE) 25 MG tablet Take 1 tablet (25 mg total) by mouth 3 (three) times daily as needed for hiccoughs. 08/03/21  Yes Cristal Deer, MD  collagenase (SANTYL) ointment Apply topically daily. 08/03/21  Yes Cristal Deer, MD  cyclobenzaprine (FLEXERIL) 10 MG tablet Take 1 tablet (10 mg total) by mouth 3 (three) times daily. 08/03/21  Yes Cristal Deer, MD  Dulaglutide (TRULICITY) 3 SW/1.0XN SOPN Inject 3 mg as directed once a week. Patient taking differently: Inject 3 mg as directed once a week. thursday 06/11/21  Yes Renato Shin, MD  feeding supplement, GLUCERNA SHAKE, (GLUCERNA SHAKE) LIQD Take 237 mLs by mouth 3 (three) times daily between meals. 08/03/21  Yes Cristal Deer, MD  gabapentin (NEURONTIN) 300 MG capsule TAKE 1 TO 2 CAPSULES(300 TO 600 MG) BY MOUTH AT BEDTIME Patient taking differently: Take 300 mg by mouth 2 (two) times daily. 12/01/19  Yes Jacelyn Pi, Lilia Argue, MD  glimepiride (AMARYL)  1 MG tablet Take 1 tablet (1 mg total) by mouth daily with breakfast. 01/08/21  Yes Renato Shin, MD  hydrALAZINE (APRESOLINE) 50 MG tablet Take 1 tablet (50 mg total) by mouth 3 (three) times daily. 08/03/21  Yes Cristal Deer, MD  lactulose (CHRONULAC) 10 GM/15ML solution Take 30 mLs (20 g total) by mouth 2 (two) times daily. 08/03/21  Yes Cristal Deer, MD  Multiple Vitamin (MULTIVITAMIN ADULT PO) Take 1 tablet by mouth daily.   Yes [provider]  mupirocin ointment (BACTROBAN) 2 % Apply 1 application topically 2 (two) times daily. 08/03/21  Yes Cristal Deer, MD  Oxycodone HCl 10 MG TABS Take 10 mg by mouth every 3 (three) hours as needed for pain. 08/03/21  Yes [provider]  senna-docusate (SENOKOT-S) 8.6-50 MG tablet Take 1 tablet by mouth 2 (two) times daily. 08/03/21  Yes Cristal Deer, MD  tadalafil (CIALIS) 20 MG tablet TK 1 T PO  PO QD PRN FOR ERECTILE DYSFUNCTION Patient taking differently: Take 20 mg by mouth daily as needed for erectile dysfunction. 07/20/19  Yes Jacelyn Pi, Irma M, MD  tobramycin (TOBREX) 0.3 % ophthalmic solution Place 1 drop into both eyes See admin instructions. Begin 1 day prior to treatment and continue the day of treatment and for one full day after treatment. 07/05/18  Yes [provider]  traZODone (DESYREL) 100 MG tablet TAKE 1 TABLET(100  MG) BY MOUTH AT BEDTIME AS NEEDED FOR SLEEP Patient taking differently: Take 100 mg by mouth at bedtime. 03/05/21  Yes Ravi, Himabindu, MD  VITAMIN D PO Take 1 tablet by mouth daily.   Yes [provider]  DULoxetine (CYMBALTA) 60 MG capsule TAKE 1 CAPSULE(60 MG) BY MOUTH DAILY Patient not taking: No sig reported 03/05/21   Einar Grad, Himabindu, MD  Ranibizumab (LUCENTIS) 0.3 MG/0.05ML SOLN 1 Dose by Intravitreal route every 3 (three) months.    [provider]     Critical care time: N/A    [name], D.O.  Internal Medicine Resident, PGY-3 [facility] Res[facility]  Pager: [phone] 5:45 AM, 08/11/2021

## 2021-08-12 ENCOUNTER — Inpatient Hospital Stay (HOSPITAL_COMMUNITY): Payer: 59

## 2021-08-12 ENCOUNTER — Encounter (HOSPITAL_COMMUNITY): Payer: Self-pay | Admitting: Gastroenterology

## 2021-08-12 DIAGNOSIS — M4624 Osteomyelitis of vertebra, thoracic region: Secondary | ICD-10-CM | POA: Diagnosis not present

## 2021-08-12 LAB — AEROBIC/ANAEROBIC CULTURE W GRAM STAIN (SURGICAL/DEEP WOUND)
Gram Stain: NONE SEEN
Gram Stain: NONE SEEN

## 2021-08-12 LAB — COMPREHENSIVE METABOLIC PANEL
ALT: 12 U/L (ref 0–44)
AST: 60 U/L — ABNORMAL HIGH (ref 15–41)
Albumin: <1.5 g/dL — ABNORMAL LOW (ref 3.5–5.0)
Alkaline Phosphatase: 275 U/L — ABNORMAL HIGH (ref 38–126)
Anion gap: 9 (ref 5–15)
BUN: 46 mg/dL — ABNORMAL HIGH (ref 6–20)
CO2: 23 mmol/L (ref 22–32)
Calcium: 8.5 mg/dL — ABNORMAL LOW (ref 8.9–10.3)
Chloride: 104 mmol/L (ref 98–111)
Creatinine, Ser: 1.85 mg/dL — ABNORMAL HIGH (ref 0.61–1.24)
GFR, Estimated: 45 mL/min — ABNORMAL LOW (ref >60)
Glucose, Bld: 80 mg/dL (ref 70–99)
Potassium: 3.4 mmol/L — ABNORMAL LOW (ref 3.5–5.1)
Sodium: 136 mmol/L (ref 135–145)
Total Bilirubin: 0.9 mg/dL (ref 0.3–1.2)
Total Protein: 5.9 g/dL — ABNORMAL LOW (ref 6.5–8.1)

## 2021-08-12 LAB — GLUCOSE, CAPILLARY
Glucose-Capillary: 112 mg/dL — ABNORMAL HIGH (ref 70–99)
Glucose-Capillary: 116 mg/dL — ABNORMAL HIGH (ref 70–99)
Glucose-Capillary: 145 mg/dL — ABNORMAL HIGH (ref 70–99)
Glucose-Capillary: 189 mg/dL — ABNORMAL HIGH (ref 70–99)
Glucose-Capillary: 217 mg/dL — ABNORMAL HIGH (ref 70–99)
Glucose-Capillary: 235 mg/dL — ABNORMAL HIGH (ref 70–99)
Glucose-Capillary: 92 mg/dL (ref 70–99)

## 2021-08-12 LAB — PROCALCITONIN: Procalcitonin: 1.85 ng/mL

## 2021-08-12 LAB — CBC WITH DIFFERENTIAL/PLATELET
Abs Immature Granulocytes: 0 10*3/uL (ref 0.00–0.07)
Basophils Absolute: 0 10*3/uL (ref 0.0–0.1)
Basophils Relative: 0 %
Eosinophils Absolute: 0 10*3/uL (ref 0.0–0.5)
Eosinophils Relative: 0 %
HCT: 23.2 % — ABNORMAL LOW (ref 39.0–52.0)
Hemoglobin: 7.5 g/dL — ABNORMAL LOW (ref 13.0–17.0)
Lymphocytes Relative: 0 %
Lymphs Abs: 0 10*3/uL — ABNORMAL LOW (ref 0.7–4.0)
MCH: 28.4 pg (ref 26.0–34.0)
MCHC: 32.3 g/dL (ref 30.0–36.0)
MCV: 87.9 fL (ref 80.0–100.0)
Monocytes Absolute: 2.2 10*3/uL — ABNORMAL HIGH (ref 0.1–1.0)
Monocytes Relative: 6 %
Neutro Abs: 35 10*3/uL — ABNORMAL HIGH (ref 1.7–7.7)
Neutrophils Relative %: 94 %
Platelets: 452 10*3/uL — ABNORMAL HIGH (ref 150–400)
RBC: 2.64 MIL/uL — ABNORMAL LOW (ref 4.22–5.81)
RDW: 14.1 % (ref 11.5–15.5)
WBC: 37.2 10*3/uL — ABNORMAL HIGH (ref 4.0–10.5)
nRBC: 0 % (ref 0.0–0.2)
nRBC: 7 /100 WBC — ABNORMAL HIGH

## 2021-08-12 LAB — POCT I-STAT 7, (LYTES, BLD GAS, ICA,H+H)
Acid-base deficit: 2 mmol/L (ref 0.0–2.0)
Bicarbonate: 22 mmol/L (ref 20.0–28.0)
Calcium, Ion: 1.22 mmol/L (ref 1.15–1.40)
HCT: 43 % (ref 39.0–52.0)
Hemoglobin: 14.6 g/dL (ref 13.0–17.0)
O2 Saturation: 92 %
Patient temperature: 98.5
Potassium: 3.7 mmol/L (ref 3.5–5.1)
Sodium: 139 mmol/L (ref 135–145)
TCO2: 23 mmol/L (ref 22–32)
pCO2 arterial: 33.1 mmHg (ref 32.0–48.0)
pH, Arterial: 7.43 (ref 7.350–7.450)
pO2, Arterial: 62 mmHg — ABNORMAL LOW (ref 83.0–108.0)

## 2021-08-12 LAB — MAGNESIUM
Magnesium: 2.2 mg/dL (ref 1.7–2.4)
Magnesium: 2.3 mg/dL (ref 1.7–2.4)

## 2021-08-12 LAB — PHOSPHORUS: Phosphorus: 5 mg/dL — ABNORMAL HIGH (ref 2.5–4.6)

## 2021-08-12 IMAGING — DX DG CHEST 1V
1 series · 1 of 1 positions shown · non-contrast
Comparison: Chest x-ray [DATE].

CLINICAL DATA: Endotracheal and feeding tube placement.

EXAM:
CHEST  1 VIEW

[chest ap]
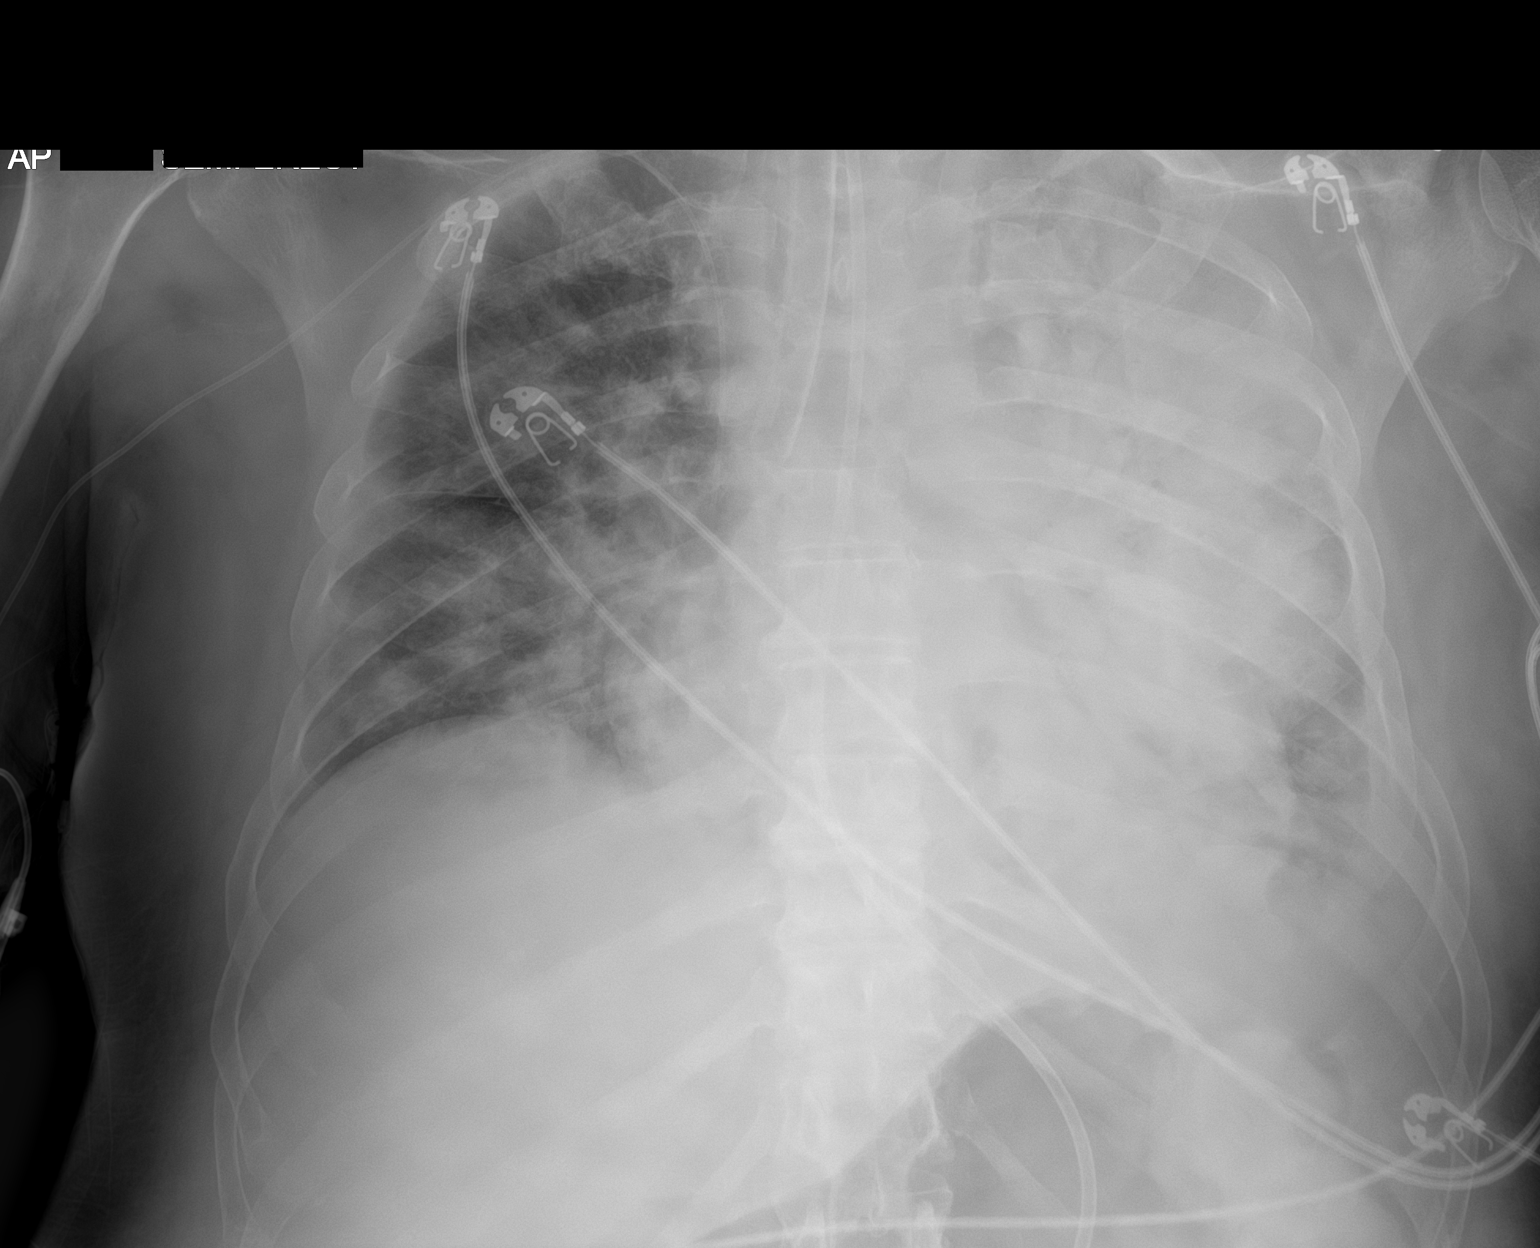

[1 of 1 positions shown; findings below may reference images not displayed]

FINDINGS: There is a new endotracheal tube in place. There is right mainstem
intubation. There is a new enteric tube extending below the
diaphragm.

Right upper extremity PICC terminates over the mid SVC, unchanged.
There is bilateral multifocal airspace disease, left greater than
right, with dense consolidation in the left lung. Overall, airspace
disease has increased. There is a new small left pleural effusion.
There is no pneumothorax. The cardiomediastinal silhouette is
stable, the heart is mildly enlarged.
IMPRESSION: 1. Right mainstem intubation.  Recommend retraction 3.5 cm.
2. Enteric tube extends below the diaphragm.
3. Worsening bilateral airspace disease, left greater than right.
These results were called by telephone at the time of interpretation
on [DATE] at [DATE] to provider [place] , who verbally
acknowledged these results.

## 2021-08-12 IMAGING — DX DG CHEST 1V PORT
1 series · 1 of 1 positions shown · non-contrast
Comparison: the previous day's study

CLINICAL DATA: Encounter for hypoxia

EXAM:
PORTABLE CHEST - 1 VIEW

[chest]
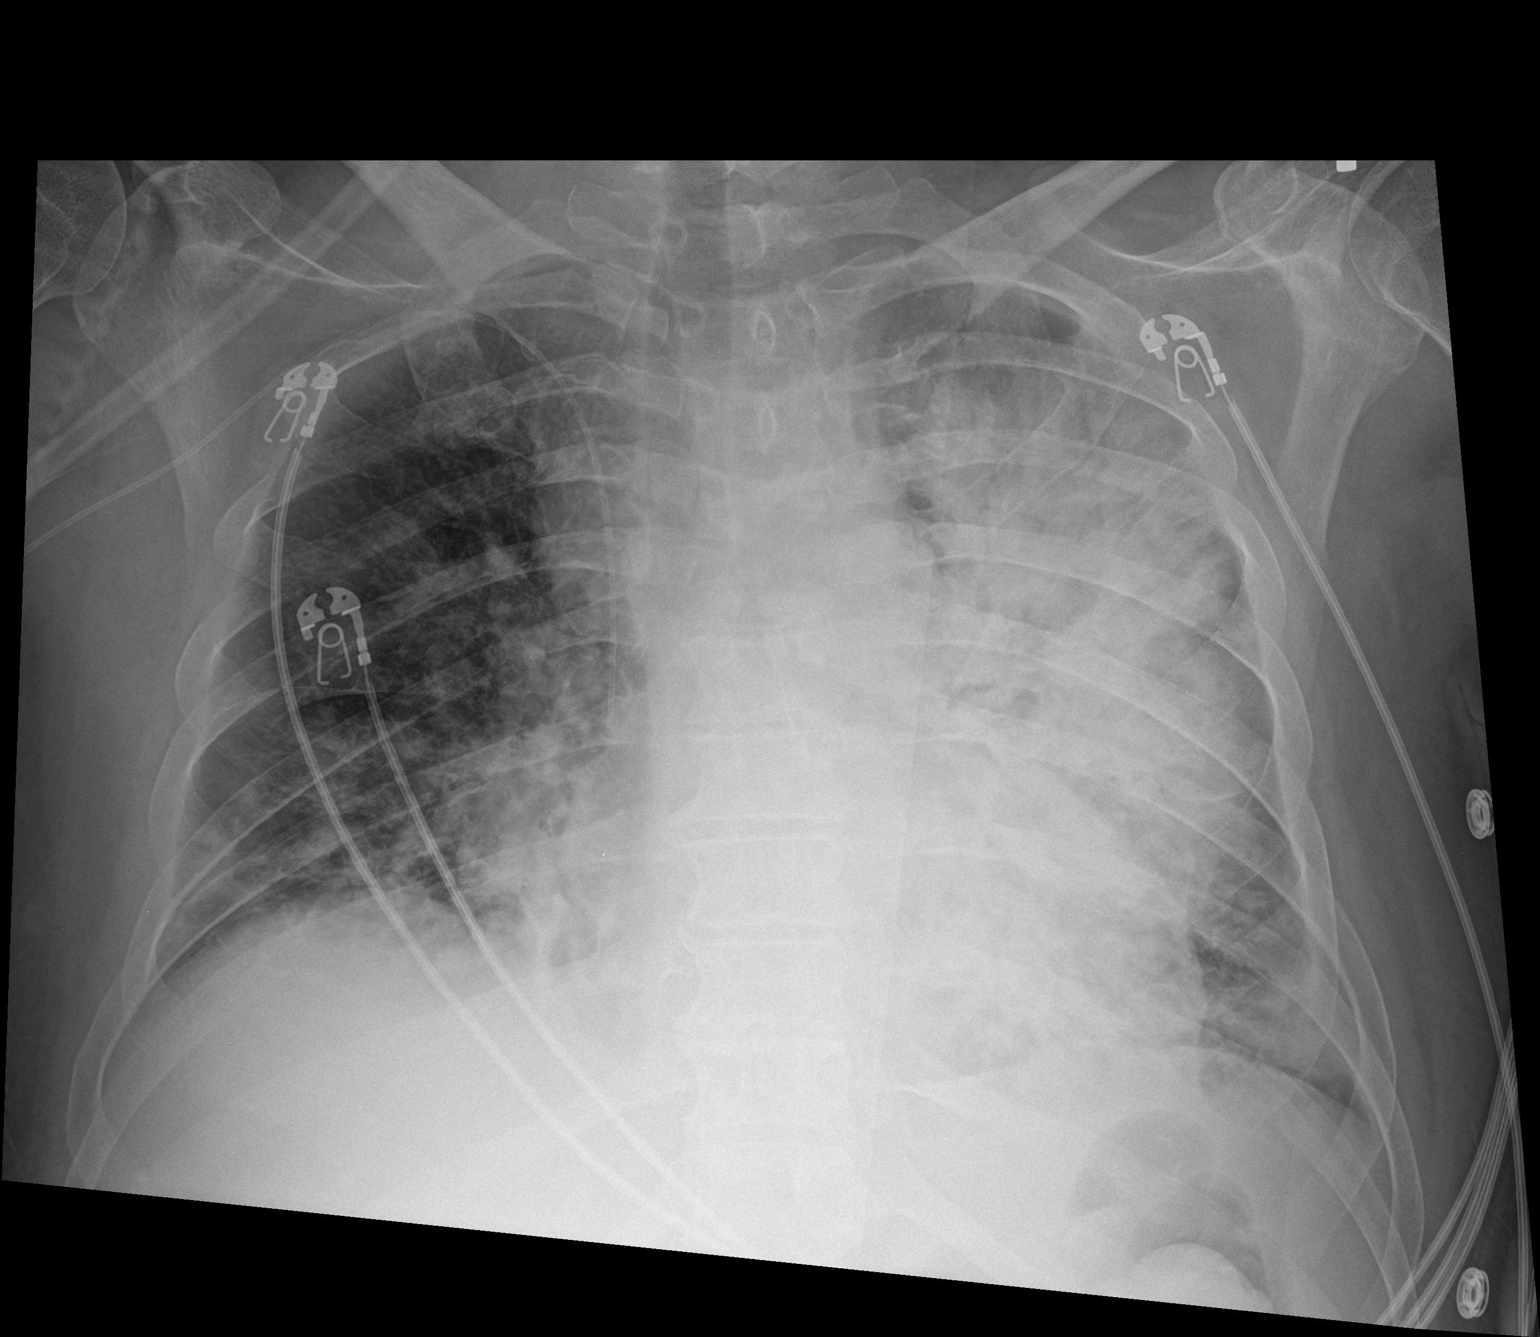

[1 of 1 positions shown; findings below may reference images not displayed]

FINDINGS: Worsening fairly dense airspace opacities throughout the left lung
with mild peripheral sparing. Slight worsening of patchy airspace
opacities on the right in a predominantly perihilar and basilar
distribution. Stable right arm PICC line to the SVC.

Heart size and mediastinal contours are within normal limits.

No effusion.  No pneumothorax.

Visualized bones unremarkable.
IMPRESSION: 1. Worsening asymmetric airspace disease, left greater than right

## 2021-08-12 IMAGING — DX DG ABD PORTABLE 1V
1 series · 1 of 1 positions shown · non-contrast
Comparison: None.

CLINICAL DATA: Feeding tube placement.

EXAM:
PORTABLE ABDOMEN - 1 VIEW

[abdomen supine]
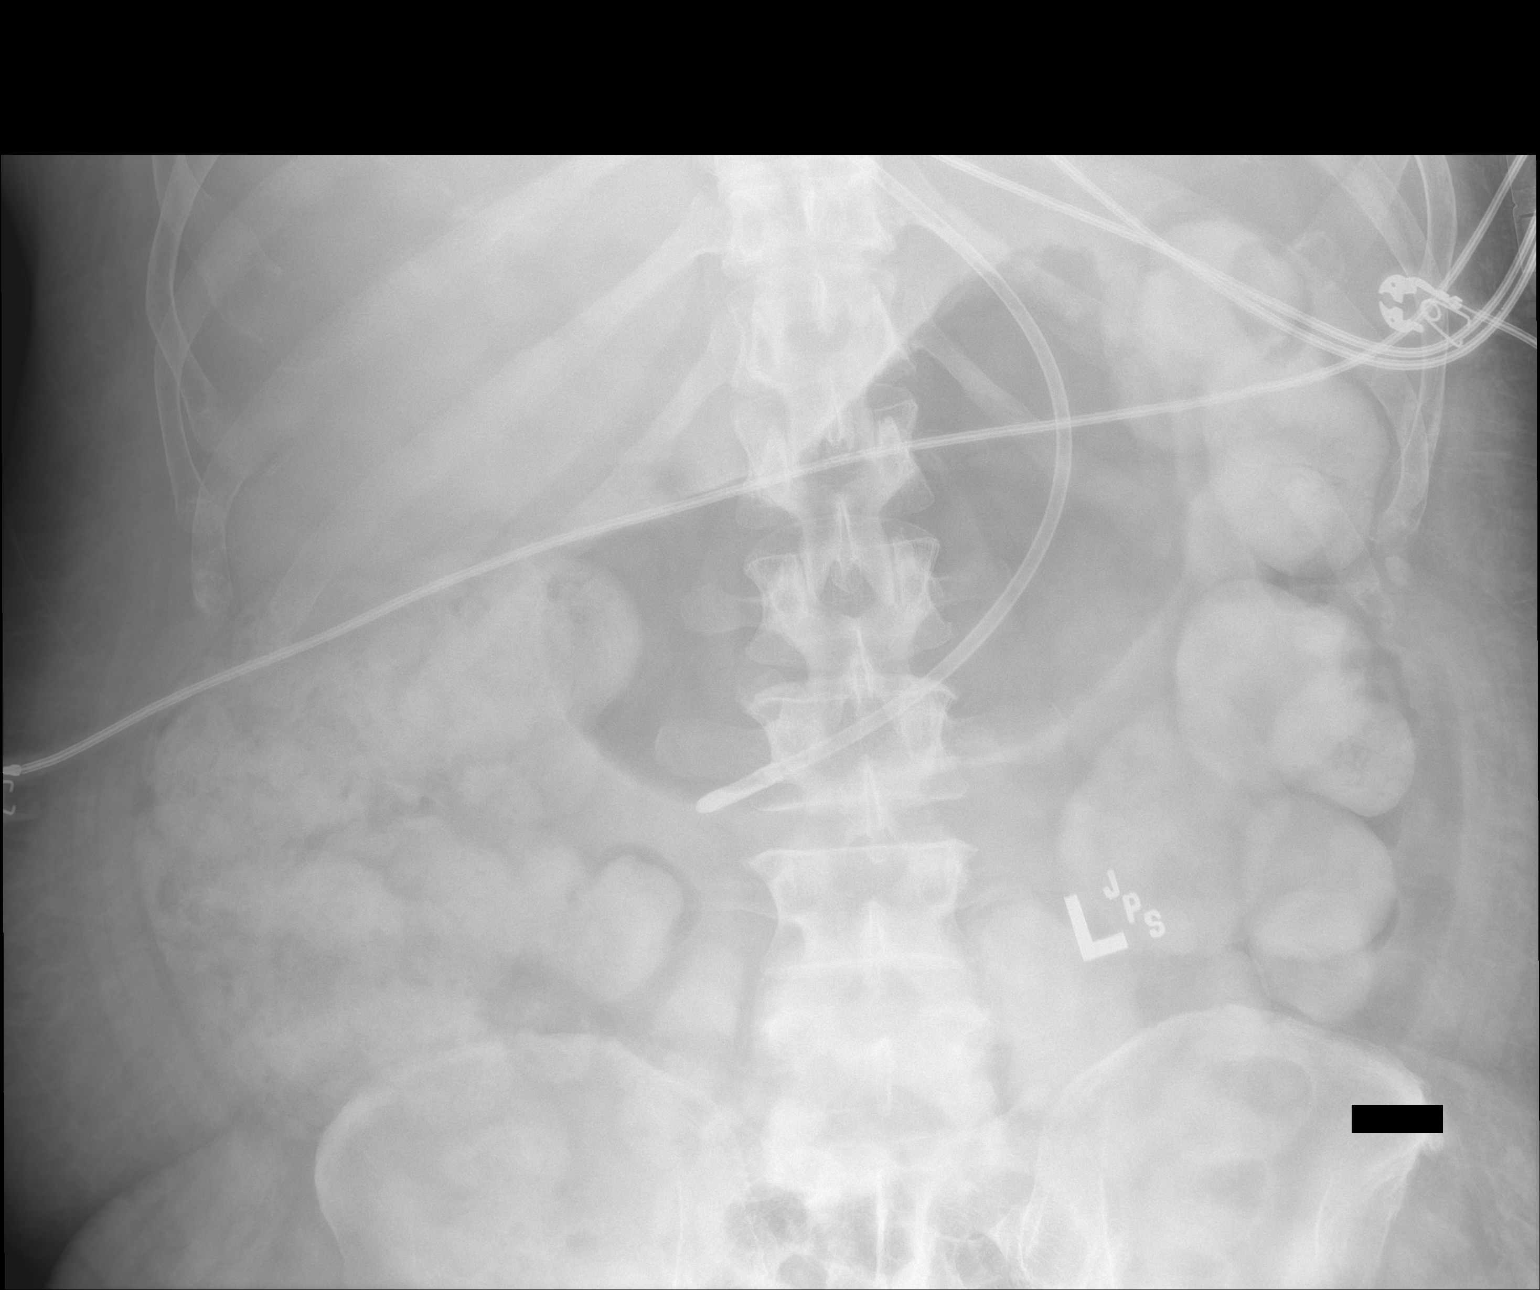

[1 of 1 positions shown; findings below may reference images not displayed]

FINDINGS: Feeding tube terminates in the distal gastric antrum. There is
retained oral contrast in the colon.
IMPRESSION: Feeding tube terminates in the distal gastric antrum.

## 2021-08-12 MED ORDER — ETOMIDATE 2 MG/ML IV SOLN: 10.0000 mg | Freq: Once | INTRAVENOUS | Status: DC

## 2021-08-12 MED ORDER — INSULIN DETEMIR 100 UNIT/ML ~~LOC~~ SOLN
8.0000 [IU] | Freq: Two times a day (BID) | SUBCUTANEOUS | Status: DC
  Administered 2021-08-12 – 2021-08-13 (×2): 8 [IU] via SUBCUTANEOUS
  Filled 2021-08-12 (×4): qty 0.08

## 2021-08-12 MED ORDER — PANTOPRAZOLE 2 MG/ML SUSPENSION: 40.0000 mg | Freq: Every day | ORAL | Status: DC

## 2021-08-12 MED ORDER — KETAMINE HCL 50 MG/5ML IJ SOSY
PREFILLED_SYRINGE | INTRAMUSCULAR | Status: AC
  Filled 2021-08-12: qty 5

## 2021-08-12 MED ORDER — NOREPINEPHRINE 4 MG/250ML-% IV SOLN
INTRAVENOUS | Status: AC
  Filled 2021-08-12: qty 250

## 2021-08-12 MED ORDER — ACETAMINOPHEN 325 MG PO TABS
650.0000 mg | ORAL_TABLET | Freq: Four times a day (QID) | ORAL | Status: DC | PRN
  Administered 2021-08-13 – 2021-08-21 (×2): 650 mg
  Filled 2021-08-12 (×2): qty 2

## 2021-08-12 MED ORDER — MIDAZOLAM HCL 2 MG/2ML IJ SOLN
INTRAMUSCULAR | Status: AC
  Filled 2021-08-12: qty 2

## 2021-08-12 MED ORDER — BACLOFEN 10 MG PO TABS
10.0000 mg | ORAL_TABLET | Freq: Three times a day (TID) | ORAL | Status: DC
  Administered 2021-08-12 – 2021-08-15 (×9): 10 mg
  Filled 2021-08-12 (×9): qty 1

## 2021-08-12 MED ORDER — PHENYLEPHRINE 40 MCG/ML (10ML) SYRINGE FOR IV PUSH (FOR BLOOD PRESSURE SUPPORT): 80.0000 ug | PREFILLED_SYRINGE | Freq: Once | INTRAVENOUS | Status: DC | PRN

## 2021-08-12 MED ORDER — FENTANYL 2500MCG IN NS 250ML (10MCG/ML) PREMIX INFUSION
0.0000 ug/h | INTRAVENOUS | Status: DC
Start: 2021-08-12 — End: 2021-08-18
  Administered 2021-08-12: 300 ug/h via INTRAVENOUS
  Administered 2021-08-12: 50 ug/h via INTRAVENOUS
  Administered 2021-08-13: 275 ug/h via INTRAVENOUS
  Administered 2021-08-13: 300 ug/h via INTRAVENOUS
  Administered 2021-08-14: 275 ug/h via INTRAVENOUS
  Administered 2021-08-14: 200 ug/h via INTRAVENOUS
  Administered 2021-08-15: 250 ug/h via INTRAVENOUS
  Administered 2021-08-15: 275 ug/h via INTRAVENOUS
  Administered 2021-08-15: 27.5 ug/h via INTRAVENOUS
  Administered 2021-08-16: 225 ug/h via INTRAVENOUS
  Filled 2021-08-12 (×10): qty 250

## 2021-08-12 MED ORDER — FLUCONAZOLE 200 MG PO TABS
400.0000 mg | ORAL_TABLET | Freq: Every day | ORAL | Status: DC
  Administered 2021-08-13 – 2021-08-15 (×3): 400 mg
  Filled 2021-08-12 (×3): qty 2

## 2021-08-12 MED ORDER — ROCURONIUM BROMIDE 10 MG/ML (PF) SYRINGE: 100.0000 mg | PREFILLED_SYRINGE | Freq: Once | INTRAVENOUS | Status: AC

## 2021-08-12 MED ORDER — ACETAMINOPHEN 650 MG RE SUPP: 650.0000 mg | Freq: Four times a day (QID) | RECTAL | Status: DC | PRN

## 2021-08-12 MED ORDER — ROCURONIUM BROMIDE 10 MG/ML (PF) SYRINGE
PREFILLED_SYRINGE | INTRAVENOUS | Status: AC
  Administered 2021-08-12: 100 mg via INTRAVENOUS
  Filled 2021-08-12: qty 10

## 2021-08-12 MED ORDER — MIDAZOLAM HCL 2 MG/2ML IJ SOLN: 1.0000 mg | Freq: Once | INTRAMUSCULAR | Status: DC

## 2021-08-12 MED ORDER — KETAMINE HCL 50 MG/5ML IJ SOSY
1.0000 mg/kg | PREFILLED_SYRINGE | Freq: Once | INTRAMUSCULAR | Status: DC
  Filled 2021-08-12: qty 10

## 2021-08-12 MED ORDER — DEXTROSE IN LACTATED RINGERS 5 % IV SOLN: INTRAVENOUS | Status: DC

## 2021-08-12 MED ORDER — KETAMINE HCL 50 MG/5ML IJ SOSY
100.0000 mg | PREFILLED_SYRINGE | Freq: Once | INTRAMUSCULAR | Status: AC
  Administered 2021-08-12: 100 mg via INTRAVENOUS

## 2021-08-12 MED ORDER — FUROSEMIDE 10 MG/ML IJ SOLN
40.0000 mg | Freq: Once | INTRAMUSCULAR | Status: AC
  Administered 2021-08-12: 40 mg via INTRAVENOUS
  Filled 2021-08-12: qty 4

## 2021-08-12 MED ORDER — AMLODIPINE BESYLATE 10 MG PO TABS
10.0000 mg | ORAL_TABLET | Freq: Every day | ORAL | Status: DC
  Administered 2021-08-15 – 2021-08-18 (×4): 10 mg
  Filled 2021-08-12 (×6): qty 1

## 2021-08-12 MED ORDER — GABAPENTIN 300 MG PO CAPS: 300.0000 mg | ORAL_CAPSULE | Freq: Two times a day (BID) | ORAL | Status: DC

## 2021-08-12 MED ORDER — FENTANYL BOLUS VIA INFUSION
50.0000 ug | INTRAVENOUS | Status: DC | PRN
  Administered 2021-08-12: 100 ug via INTRAVENOUS
  Administered 2021-08-13: 50 ug via INTRAVENOUS
  Administered 2021-08-13 – 2021-08-14 (×2): 100 ug via INTRAVENOUS
  Filled 2021-08-12: qty 100

## 2021-08-12 MED ORDER — PROSOURCE TF PO LIQD
45.0000 mL | Freq: Three times a day (TID) | ORAL | Status: DC
  Administered 2021-08-12 – 2021-08-18 (×18): 45 mL
  Filled 2021-08-12 (×18): qty 45

## 2021-08-12 MED ORDER — FLUCONAZOLE 200 MG PO TABS
400.0000 mg | ORAL_TABLET | Freq: Every day | ORAL | Status: DC
  Filled 2021-08-12: qty 2

## 2021-08-12 MED ORDER — SENNOSIDES-DOCUSATE SODIUM 8.6-50 MG PO TABS
1.0000 | ORAL_TABLET | Freq: Two times a day (BID) | ORAL | Status: DC
  Administered 2021-08-12 – 2021-08-16 (×8): 1
  Filled 2021-08-12 (×8): qty 1

## 2021-08-12 MED ORDER — POLYETHYLENE GLYCOL 3350 17 G PO PACK
17.0000 g | PACK | Freq: Three times a day (TID) | ORAL | Status: DC
  Administered 2021-08-12 – 2021-08-16 (×13): 17 g
  Filled 2021-08-12 (×13): qty 1

## 2021-08-12 MED ORDER — ADULT MULTIVITAMIN W/MINERALS CH
1.0000 | ORAL_TABLET | Freq: Every day | ORAL | Status: DC
Start: 2021-08-13 — End: 2021-08-19
  Administered 2021-08-13 – 2021-08-18 (×6): 1
  Filled 2021-08-12 (×6): qty 1

## 2021-08-12 MED ORDER — POTASSIUM CHLORIDE 10 MEQ/100ML IV SOLN
10.0000 meq | INTRAVENOUS | Status: AC
  Administered 2021-08-12 (×4): 10 meq via INTRAVENOUS
  Filled 2021-08-12 (×5): qty 100

## 2021-08-12 MED ORDER — HYDROMORPHONE BOLUS VIA INFUSION
1.0000 mg | INTRAVENOUS | Status: DC | PRN
Start: 2021-08-12 — End: 2021-08-12
  Filled 2021-08-12: qty 1

## 2021-08-12 MED ORDER — PANTOPRAZOLE 2 MG/ML SUSPENSION
40.0000 mg | Freq: Every day | ORAL | Status: DC
  Administered 2021-08-13 – 2021-08-18 (×6): 40 mg
  Filled 2021-08-12 (×6): qty 20

## 2021-08-12 MED ORDER — POTASSIUM CHLORIDE 10 MEQ/100ML IV SOLN
10.0000 meq | INTRAVENOUS | Status: AC
  Administered 2021-08-12 (×4): 10 meq via INTRAVENOUS
  Filled 2021-08-12 (×4): qty 100

## 2021-08-12 MED ORDER — HYDROMORPHONE HCL PF 10 MG/ML IJ SOLN
0.0000 mg/h | INTRAMUSCULAR | Status: DC
Start: 2021-08-12 — End: 2021-08-12
  Filled 2021-08-12: qty 5

## 2021-08-12 MED ORDER — FENTANYL CITRATE (PF) 100 MCG/2ML IJ SOLN
INTRAMUSCULAR | Status: AC
  Administered 2021-08-12: 100 ug
  Filled 2021-08-12: qty 2

## 2021-08-12 MED ORDER — NALOXEGOL OXALATE 25 MG PO TABS: 25.0000 mg | ORAL_TABLET | Freq: Every day | ORAL | Status: DC

## 2021-08-12 MED ORDER — GABAPENTIN 250 MG/5ML PO SOLN
300.0000 mg | Freq: Two times a day (BID) | ORAL | Status: DC
  Administered 2021-08-12 – 2021-08-17 (×10): 300 mg
  Filled 2021-08-12 (×11): qty 6

## 2021-08-12 MED ORDER — TRAZODONE HCL 50 MG PO TABS
100.0000 mg | ORAL_TABLET | Freq: Every day | ORAL | Status: DC
  Administered 2021-08-13 – 2021-08-18 (×6): 100 mg
  Filled 2021-08-12 (×6): qty 2

## 2021-08-12 MED ORDER — PHENYLEPHRINE 40 MCG/ML (10ML) SYRINGE FOR IV PUSH (FOR BLOOD PRESSURE SUPPORT)
PREFILLED_SYRINGE | INTRAVENOUS | Status: AC
  Filled 2021-08-12: qty 10

## 2021-08-12 MED ORDER — MIDAZOLAM HCL 2 MG/2ML IJ SOLN
2.0000 mg | INTRAMUSCULAR | Status: DC | PRN
  Administered 2021-08-12 – 2021-08-13 (×4): 2 mg via INTRAVENOUS
  Filled 2021-08-12 (×6): qty 2

## 2021-08-12 MED ORDER — CHLORHEXIDINE GLUCONATE 0.12% ORAL RINSE (MEDLINE KIT)
15.0000 mL | Freq: Two times a day (BID) | OROMUCOSAL | Status: DC
Start: 2021-08-12 — End: 2021-08-17
  Administered 2021-08-12 – 2021-08-17 (×10): 15 mL via OROMUCOSAL

## 2021-08-12 MED ORDER — ORAL CARE MOUTH RINSE
15.0000 mL | OROMUCOSAL | Status: DC
  Administered 2021-08-12 – 2021-08-17 (×46): 15 mL via OROMUCOSAL

## 2021-08-12 MED ORDER — LACTULOSE 10 GM/15ML PO SOLN
20.0000 g | Freq: Two times a day (BID) | ORAL | Status: DC
  Administered 2021-08-12 – 2021-08-13 (×3): 20 g
  Filled 2021-08-12 (×3): qty 30

## 2021-08-12 MED ORDER — VITAL 1.5 CAL PO LIQD
1000.0000 mL | ORAL | Status: DC
  Administered 2021-08-12 – 2021-08-17 (×6): 1000 mL
  Filled 2021-08-12 (×2): qty 1000

## 2021-08-12 MED ORDER — MIDAZOLAM HCL 2 MG/2ML IJ SOLN
2.0000 mg | Freq: Once | INTRAMUSCULAR | Status: AC
  Administered 2021-08-12: 2 mg via INTRAVENOUS

## 2021-08-12 MED ORDER — ETOMIDATE 2 MG/ML IV SOLN
INTRAVENOUS | Status: AC
  Filled 2021-08-12: qty 20

## 2021-08-12 NOTE — Progress Notes (Addendum)
NAME:  Jose [name], MRN:  [id number], DOB:  Jun 23, 1976, LOS: 6 ADMISSION DATE:  08/06/2021, CONSULTATION DATE:  08/10/2021 REFERRING MD:  TRH, CHIEF COMPLAINT:  Hypoxia   Brief History   Jose [name] is a 45 y.o. 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 On 10/17 patient developed coffee-ground emesis, now s/p EGD showing circumferential esophagitis with hemorrhage and non-bleeding duodenal ulcers now on BID PPI. On 10/17, pccm was consulted for acute hypoxia.   Past Medical History   has a past medical history of GAD (generalized anxiety disorder), Hyperlipidemia, Macular degeneration, bilateral, Retinopathy, and Type II diabetes mellitus with complication, uncontrolled. Significant Hospital Events   10/13 -admitted with worsening thoracic osteomyelitis, constipation/abd distention, neurosurgery consulted  10/14 - T4-T5 laminectomy, left T5 pediculotomy, and spinal cord decompression 10/17 - Hgb drop to 6.2, coffee ground emesis s/p EGD with esophagitis and peptic ulcer 10/17 - hypoxia and pccm consult   Consults:  Neurosurgery Infection disease PCCM  Procedures:  10/14 - T4-T5 laminectomy, left T5 pediculotomy, and spinal cord decompression 10/17 - EGD  Significant Diagnostic Tests:  CT Abd/Pelvis Result Date: 08/06/2021 IMPRESSION: 1. Generally large burden of stool and stool balls throughout the colon. 2. Distended urinary bladder, measuring up to 19.0 cm. Correlate for urinary retention. 3. Anasarca.  CTA: Result Date: 08/06/2021 1. Negative examination for pulmonary embolism. 2.  Moderate bilateral pleural effusions and associated atelectasis or consolidation, new compared to prior examination. 3. There is substantial interval worsening of destructive lytic change about the T4-T5 disc space. Overlying paravertebral soft tissue thickening. Findings are consistent with worsened discitis osteomyelitis but in general better assessed by same-day MR. 4. Unchanged enlarged right hilar and subcarinal lymph nodes, likely reactive. 5. Cardiomegaly and coronary artery disease.  MR Thoracic Spine: Result Date: 08/06/2021 1. Redemonstrated abnormal bone marrow signal and enhancement involving the T4 and T5 levels with increased surrounding paraspinal phlegmon and new epidural phlegmon (6 mm thick ventrally). Interval T5 pathologic fracture (50-60% height loss) with new 4 mm of bony retropulsion. Constellation of findings results in new moderate to severe left eccentric canal stenosis at T4 with cord deformity. 2. Overall, findings are most likely secondary to progressive discitis/osteomyelitis. Superimposed osseous metastatic disease or multiple myeloma is not excluded by imaging. Recommend correlation with the results of reported recent biopsy. 3. Multiple small (subcentimeter) fluid collections in the left posterior paraspinal phlegmon could represent tiny abscesses  DG CHEST PORT 1 VIEW Result Date: 08/10/2021 IMPRESSION: Extensive airspace opacity throughout the left lung field. Comparatively mild opacity within the right lung base. Given the asymmetry, these findings likely reflect multifocal pneumonia. Small left pleural effusion.   EGD: Result Date: 08/10/2021 LA Grade D (one or more mucosal breaks involving at least 75% of esophageal circumference) esophagitis with no bleeding was found in the entire esophagus. Biopsies were taken with a cold forceps for histology. Findings: Hematin (altered blood/coffee-ground-like material) was found in the gastric body. No gross lesions  were noted in the entire examined stomach. Biopsies were taken with a cold forceps for histology. Few non-bleeding superficial duodenal ulcers with no stigmata of bleeding were found in the duodenal bulb. The largest lesion was 15 mm in  largest dimension. Biopsies were taken with a cold forceps for histology. The second portion of the duodenum was normal.  CT HEAD WO CONTRAST (5MM) Result Date: 08/11/2021 IMPRESSION: No acute intracranial process.   DG CHEST PORT 1 VIEW Result Date: 08/11/2021 IMPRESSION: No change from the prior study.      Micro Data:  10/13 - Bcx no growth x5d 10/13 - Ucx no growth 10/18 - resp culture pending   Antimicrobials:  Cefepime 10/13-10/13  Cefazolin 10/13 > Zosyn 10/17-10/17  Vancomycin 10/17-10  Interim History/Subjective:  Patient resting in bed. He states that he is feeling better compared to yesterday. Still having chest/MSK pain. He has been on BIPAP all night at 100% FiO2 and saturating at 932%.    Objective:  Blood pressure 138/76, pulse (!) 114, temperature 99.2 F (37.3 C), temperature source Axillary, resp. rate (!) 22, height _0  (1.803 m), weight 89.3 kg, SpO2 95 %.    FiO2 (%):  [80 %-100 %] 100 %   Intake/Output Summary (Last 24 hours) at 08/12/2021 0700 Last data filed at 08/12/2021 0602 Gross per 24 hour  Intake 1726.36 ml  Output 2575 ml  Net -848.64 ml   Filed Weights   08/06/21 0847 08/07/21 0628 08/09/21 0502  Weight: 86.2 kg 89.6 kg 89.3 kg    Examination: General: alert and orients and mild to moderate distress with bipap on HEENT: PERRLA, EOM intact Lungs: Diffuse rales throughout and shallow breath sounds Cardiovascular: Tachycardic with regular rhythm, no murmurs rubs or gallops  Abdomen: distended with decreased bowel sounds, but nontender Extremities: warm and dry, normal pulse, and trace edema Neuro: no focal deficits  GU: n/a  Patient Lines/Drains/Airways Status     Active Line/Drains/Airways      Name Placement date Placement time Site Days   Peripheral IV 08/07/21 18 G Left Hand 08/07/21  1534  Hand  4   PICC Single Lumen 07/31/21 Right Brachial 40 cm 0 cm 07/31/21  1034  Brachial  11   Incision (Closed) 08/07/21 Back 08/07/21  1803  -- 4   Pressure Injury 08/10/21 Coccyx Medial Stage 2 -  Partial thickness loss of dermis presenting as a shallow open injury with a red, pink wound bed without slough. 08/10/21  1700  -- 1   Wound / Incision (Open or Dehisced) 07/22/21 Diabetic ulcer Toe (Comment  which one) Left Big toe ulcer 07/22/21  --  Toe (Comment  which one)  20            Resolved Hospital Problem list     Assessment & Plan:   Acute hypoxic respiratory failure, Multifactorial CAP Acute MSK Pain S/p decompression of T4-5 osteodiscitis with extensive pain Hx of COVID-19 infection, 1 month ago Moderate Bilateral Pleural Effusions and Atelectasis Patient remained on BIPAP at 100% FiO2 overnight. He had a fever of 101.76F overnight and has a significant leukocytosis of 37.2 and procal  1.48>1.85. Cxr performed showing worsening left airspace opacity concerning for pneumonia. He was transitioned to [facility] yesterday. Has a history of MSSA and MRSA PCR negative. Combination of pain limiting lung expansion, recent COVID infection and post op-pneumonia likely contributing to his acute resp failure.  - Supplemental O2 to keep SpO2 >92% - Pain and anxiety management seen below  - Resp culture pending - Continue Unasyn per ID - Consider thoracentesis for persistent respiratory failure. - Repeat ABG and titrate O2 - Avoid oral meds if possible.  - If unablt to wean of BIPAP then will get  cortrak and start tube feeds  Esophagitis with hematemesis Duodenal ulcer:  Patient s/p 1 unit PRBC yesterday with post transfusion hgb 7.5. -On SCDS - Trend CBC - On PPI 40 mg BID - trend Hgb - May still need iron transfusion.  Status post thoracic laminectomy Patient is status post  T4-T5 laminectomy. Neurosurgery is consulted. He continues to have pain.  - Pain management with q1 hour fentanyl and lidocaine patch - Baclofen and valium for spasms   Osteomyelitis/MSSA bacteremia Patient is on day 5 of antibiotic therapy with cefazolin and recently broadened to vancomycin and Zosyn.  Infectious disease is following. -  Unasyn Per ID  Diabetes mellitus with hyperglycemia  Patient continues to have elevated CBGs in the mid to high 200's on SSI and levemir 15 units BID  - Will increase correction factor of SSI  - Basal  - Goal CBG of 140-180 - CBG monitoring    Anxiety disorder this may be exacerbating his pain  - Continue trazodone 100 mg qhs    Best Practice:  Diet: Regular consistency (see orders) Pain/Anxiety/Delirium protocol (if indicated): yes, fentanyl and oxy VAP protocol (if indicated): no DVT prophylaxis: SCD GI prophylaxis: PPI Glucose control: SSI Yes and Basal insulin Yes Lines: N/A Foley:  N/A Mobility: bed rest, Code Status: full code Family Communication: pending Disposition: ICU  Labs   CBC: Recent Labs  Lab 08/06/21 0937 08/07/21 0447 08/09/21 0311 08/09/21 1438 08/10/21 0341 08/10/21 1510 08/11/21 0308 08/11/21 1350 08/12/21 0233  WBC 13.1*   < > 24.8*  --  23.2*  --  24.9* 30.5* 37.2*  NEUTROABS 10.0*  --   --   --   --   --   --   --  35.0*  HGB 8.4*   < > 8.1*   < > 6.2* 7.1* 6.6* 7.3* 7.5*  HCT 26.1*   < > 24.8*   < > 19.6* 21.7* 20.3* 22.6* 23.2*  MCV 85.9   < > 86.7  --  87.9  --  87.1 86.9 87.9  PLT 518*   < > 492*  --  522*  --  464* 439* 452*   < > = values in this interval not displayed.    Basic Metabolic Panel: Recent Labs  Lab 08/07/21 0447 08/07/21 1553 08/09/21 0311 08/10/21 0341 08/11/21 0308 08/12/21 0233  NA 133* 137 134* 135 133* 136  K 4.2 4.8 4.7 4.1 3.6 3.4*  CL 102  --  100 103 103 104  CO2 24  --  20* 21* 22 23  GLUCOSE 275*  --  247* 286* 231* 80  BUN 19  --  35* 58* 60* 46*  CREATININE  1.47*  --  2.48* 2.34* 1.92* 1.85*  CALCIUM 9.0  --  8.5* 8.3* 8.3* 8.5*  MG  --   --  2.0 2.0 1.9 2.2  PHOS  --   --  4.6 3.2 3.2  --    GFR: Estimated Creatinine Clearance: 52.6 mL/min (A) (by C-G formula based on SCr of 1.85 mg/dL (H)). Recent Labs  Lab 08/06/21 0937 08/06/21 1828 08/07/21 0447 08/10/21 0341 08/11/21 0308 08/11/21 1350 08/12/21 0233  PROCALCITON  --   --   --   --  1.48  --  1.85  WBC 13.1*  --    < > 23.2* 24.9* 30.5* 37.2*  LATICACIDVEN 0.6 0.5  --   --   --   --   --    < > = values  in this interval not displayed.    Liver Function Tests: Recent Labs  Lab 08/06/21 0937 08/07/21 0447 08/09/21 0311 08/12/21 0233  AST 17 13* 13* 60*  ALT <5 <_0 ALKPHOS 163* 145* 122 275*  BILITOT 0.5 0.6 0.9 0.9  PROT 7.0 6.1* 6.1* 5.9*  ALBUMIN 2.0* 1.7* 1.7* <1.5*   Recent Labs  Lab 08/06/21 0937  LIPASE 21   No results for input(s): AMMONIA in the last 168 hours.  ABG    Component Value Date/Time   PHART 7.453 (H) 08/11/2021 2242   PCO2ART 33.8 08/11/2021 2242   PO2ART 66.6 (L) 08/11/2021 2242   HCO3 23.3 08/11/2021 2242   TCO2 28 08/07/2021 1553   ACIDBASEDEF 0.2 08/11/2021 2242   O2SAT 93.7 08/11/2021 2242     Coagulation Profile: No results for input(s): INR, PROTIME in the last 168 hours.  Cardiac Enzymes: No results for input(s): CKTOTAL, CKMB, CKMBINDEX, TROPONINI in the last 168 hours.  HbA1C: Hemoglobin A1C  Date/Time Value Ref Range Status  06/11/2021 07:49 AM 7.5 (A) 4.0 - 5.6 % Final  03/10/2021 09:39 AM 7.0 (A) 4.0 - 5.6 % Final   Hgb A1c MFr Bld  Date/Time Value Ref Range Status  05/19/2021 04:31 AM 7.7 (H) 4.8 - 5.6 % Final    Comment:    (NOTE)         Prediabetes: 5.7 - 6.4         Diabetes: >6.4         Glycemic control for adults with diabetes: <7.0   11/22/2019 10:12 AM 7.2 (H) 4.8 - 5.6 % Final    Comment:             Prediabetes: 5.7 - 6.4          Diabetes: >6.4          Glycemic control for adults with  diabetes: <7.0     CBG: Recent Labs  Lab 08/11/21 1936 08/11/21 2032 08/11/21 2339 08/12/21 0019 08/12/21 0327  GLUCAP 68* 99 64* 112* 92    Review of Systems:   See above  Past Medical History  He,  has a past medical history of GAD (generalized anxiety disorder), Hyperlipidemia, Macular degeneration, bilateral, Retinopathy, and Type II diabetes mellitus with complication, uncontrolled.   Surgical History    Past Surgical History:  Procedure Laterality Date   APPENDECTOMY     BUBBLE STUDY  07/29/2021   Procedure: BUBBLE STUDY;  Surgeon: Jerline Pain, MD;  Location: [place];  Service: Cardiovascular;;   HERNIA REPAIR     IR FLUORO GUIDED NEEDLE PLC ASPIRATION/INJECTION LOC  07/28/2021   LUMBAR LAMINECTOMY/DECOMPRESSION MICRODISCECTOMY N/A 08/07/2021   Procedure: THORACIC FOUR - THORACIC FIVE LAMINECTOMY/DECOMPRESSION OF SPINAL CORD, DEBRIDEMENT OF ABSCESS, MICRODISCECTOMY, INTRAOPERATIVE ULTRASOUND;  Surgeon: Consuella Lose, MD;  Location: [place];  Service: Neurosurgery;  Laterality: N/A;   TEE WITHOUT CARDIOVERSION N/A 07/29/2021   Procedure: TRANSESOPHAGEAL ECHOCARDIOGRAM (TEE);  Surgeon: Jerline Pain, MD;  Location[facility];  Service: Cardiovascular;  Laterality: N/A;   TRIGGER FINGER RELEASE Right 10/25/2019   Procedure: RIGHT INDEX FINGER RELEASE TRIGGER FINGER/A-1 PULLEY;  Surgeon: Daryll Brod, MD;  Location: [place];  Service: Orthopedics;  Laterality: Right;  IV REGIONAL FOREARM BLOCK     Social History   reports that he quit smoking about 12 months ago. His smoking use included cigarettes. He has a 20.00 pack-year smoking history. He has never used smokeless tobacco. He reports that he  does not currently use alcohol. He reports that he does not use drugs.   Family History   His family history includes ADD / ADHD in his brother and son; Diabetes in his father and mother; Hyperlipidemia in his brother and mother; Stroke in his brother  and mother.   Allergies Allergies  Allergen Reactions   Cranberry Itching     Home Medications  Prior to Admission medications   Medication Sig Start Date End Date Taking? Authorizing Provider  acetaminophen (TYLENOL) 500 MG tablet Take 500 mg by mouth every 6 (six) hours as needed for mild pain, fever or headache.   Yes [provider]  albuterol (VENTOLIN HFA) 108 (90 Base) MCG/ACT inhaler Inhale 1 puff into the lungs every 4 (four) hours as needed for wheezing or shortness of breath. 05/27/21  Yes [provider]  ALPRAZolam (XANAX) 0.25 MG tablet Take 0.25 mg by mouth 2 (two) times daily as needed for anxiety. 06/12/21  Yes [provider]  amLODipine (NORVASC) 10 MG tablet Take 1 tablet (10 mg total) by mouth daily. 08/03/21  Yes Cristal Deer, MD  Ascorbic Acid (VITAMIN C PO) Take 1 tablet by mouth daily.   Yes [provider]  atorvastatin (LIPITOR) 40 MG tablet Take 1 tablet (40 mg total) by mouth daily. 03/12/19  Yes Jacelyn Pi, Lilia Argue, MD  ceFAZolin (ANCEF) IVPB Inject 2 g into the vein every 8 (eight) hours. Indication:  Osteomyelitis  First Dose: No Last Day of Therapy:  09/04/2022  Labs - Once weekly:  CBC/D and BMP, Labs - Every other week:  ESR and CRP Method of administration: IV Push Method of administration may be changed at the discretion of home infusion pharmacist based upon assessment of the patient and/or caregiver's ability to self-administer the medication ordered. 07/31/21  Yes Cristal Deer, MD  chlorproMAZINE (THORAZINE) 25 MG tablet Take 1 tablet (25 mg total) by mouth 3 (three) times daily as needed for hiccoughs. 08/03/21  Yes Cristal Deer, MD  collagenase (SANTYL) ointment Apply topically daily. 08/03/21  Yes Cristal Deer, MD  cyclobenzaprine (FLEXERIL) 10 MG tablet Take 1 tablet (10 mg total) by mouth 3 (three) times daily. 08/03/21  Yes Cristal Deer, MD  Dulaglutide (TRULICITY) 3 TM/5.4YT SOPN Inject 3 mg  as directed once a week. Patient taking differently: Inject 3 mg as directed once a week. thursday 06/11/21  Yes Renato Shin, MD  feeding supplement, GLUCERNA SHAKE, (GLUCERNA SHAKE) LIQD Take 237 mLs by mouth 3 (three) times daily between meals. 08/03/21  Yes Cristal Deer, MD  gabapentin (NEURONTIN) 300 MG capsule TAKE 1 TO 2 CAPSULES(300 TO 600 MG) BY MOUTH AT BEDTIME Patient taking differently: Take 300 mg by mouth 2 (two) times daily. 12/01/19  Yes Jacelyn Pi, Lilia Argue, MD  glimepiride (AMARYL) 1 MG tablet Take 1 tablet (1 mg total) by mouth daily with breakfast. 01/08/21  Yes Renato Shin, MD  hydrALAZINE (APRESOLINE) 50 MG tablet Take 1 tablet (50 mg total) by mouth 3 (three) times daily. 08/03/21  Yes Cristal Deer, MD  lactulose (CHRONULAC) 10 GM/15ML solution Take 30 mLs (20 g total) by mouth 2 (two) times daily. 08/03/21  Yes Cristal Deer, MD  Multiple Vitamin (MULTIVITAMIN ADULT PO) Take 1 tablet by mouth daily.   Yes [provider]  mupirocin ointment (BACTROBAN) 2 % Apply 1 application topically 2 (two) times daily. 08/03/21  Yes Cristal Deer, MD  Oxycodone HCl 10 MG TABS Take 10 mg by mouth every 3 (three) hours  as needed for pain. 08/03/21  Yes [provider]  senna-docusate (SENOKOT-S) 8.6-50 MG tablet Take 1 tablet by mouth 2 (two) times daily. 08/03/21  Yes Cristal Deer, MD  tadalafil (CIALIS) 20 MG tablet TK 1 T PO  PO QD PRN FOR ERECTILE DYSFUNCTION Patient taking differently: Take 20 mg by mouth daily as needed for erectile dysfunction. 07/20/19  Yes Jacelyn Pi, Irma M, MD  tobramycin (TOBREX) 0.3 % ophthalmic solution Place 1 drop into both eyes See admin instructions. Begin 1 day prior to treatment and continue the day of treatment and for one full day after treatment. 07/05/18  Yes [provider]  traZODone (DESYREL) 100 MG tablet TAKE 1 TABLET(100 MG) BY MOUTH AT BEDTIME AS NEEDED FOR SLEEP Patient taking differently: Take 100  mg by mouth at bedtime. 03/05/21  Yes Ravi, Himabindu, MD  VITAMIN D PO Take 1 tablet by mouth daily.   Yes [provider]  DULoxetine (CYMBALTA) 60 MG capsule TAKE 1 CAPSULE(60 MG) BY MOUTH DAILY Patient not taking: No sig reported 03/05/21   Einar Grad, Himabindu, MD  Ranibizumab (LUCENTIS) 0.3 MG/0.05ML SOLN 1 Dose by Intravitreal route every 3 (three) months.    [provider]     Critical care time: N/A    [name], D.O.  Internal Medicine Resident, PGY-3 [facility]  Pager: [phone] 7:00 AM, 08/12/2021

## 2021-08-12 NOTE — Evaluation (Signed)
Occupational Therapy Evaluation Patient Details Name: Jose [name] MRN: [id number] DOB: [birth year] Today's Date: 08/12/2021   History of Present Illness 45 y.o. male admitted on 08/06/2021 with worsening back pain, S/P T4-5 laminectomy, LT5 pediculotomy, and decompression of spinal cord on 10/14.  Pt now with acute hypoxic respiratory failure in setting of recent GI bleed and recent spinal surgery with extensive pain that is impeding his ability to take deep breath. Endoscopy 10/17. PMhx: T2DM, retinopathy, neuropathy, HTN, HLD, CKD, covid July 2022   Clinical Impression   PTA patient reports independent and working. Admitted for above and presenting with problem list below, including pain, decreased activity tolerance and decreased cardiopulmomary status. Patient cleared for OT/PT by RN.  Patient session limited to bed level, repositioned with total assist +2. Requires up to total assist for ADLs (LB).  Limited by RR and increasing anxiety with pain, RN present and aware to provide pain medication.  Pt on BiPAP during session with SpO2 dipping to 85% with cueing for PLB and RR up to 44; and recovering to 93% SpO2 and RR in 20s at end of session. HR at rest 120s, up to 135 with minimal activity. Will follow acutely.      Recommendations for follow up therapy are one component of a multi-disciplinary discharge planning process, led by the attending physician.  Recommendations may be updated based on patient status, additional functional criteria and insurance authorization.   Follow Up Recommendations  CIR (pending progress)    Equipment Recommendations  Other (comment) (TBD)    Recommendations for Other Services Rehab consult     Precautions / Restrictions Precautions Precautions: Fall;Back Precaution Comments: cortrak, bipap, watch vitals Restrictions Weight Bearing Restrictions: No      Mobility Bed Mobility Overal bed mobility: Needs Assistance             General bed  mobility comments: total assist +2 to reposition in bed with pt unable to tolerate further mobility at this time    Transfers                 General transfer comment: deferred    Balance                                           ADL either performed or assessed with clinical judgement   ADL Overall ADL's : Needs assistance/impaired     Grooming: Minimal assistance;Bed level   Upper Body Bathing: Moderate assistance;Bed level   Lower Body Bathing: Total assistance;+2 for physical assistance;+2 for safety/equipment;Bed level             Toilet Transfer Details (indicate cue type and reason): deferred         Functional mobility during ADLs: Total assistance;+2 for physical assistance;+2 for safety/equipment General ADL Comments: limited by pain and anxiety, respositioned in bed only     Vision         Perception     Praxis      Pertinent Vitals/Pain Pain Assessment: 0-10 Faces Pain Scale: Hurts worst Pain Location: back Pain Descriptors / Indicators: Grimacing;Restless;Discomfort Pain Intervention(s): Monitored during session;Repositioned;Patient requesting pain meds-RN notified     Hand Dominance Right   Extremity/Trunk Assessment Upper Extremity Assessment Upper Extremity Assessment: Generalized weakness (able to move UEs but not fomally tested)   Lower Extremity Assessment Lower Extremity Assessment: Defer to PT evaluation   Cervical /  Trunk Assessment Cervical / Trunk Assessment: Other exceptions Cervical / Trunk Exceptions: s/p back surgery   Communication Communication Communication: Other (comment) (on BiPAP)   Cognition Arousal/Alertness: Awake/alert Behavior During Therapy: Anxious;Restless Overall Cognitive Status: Within Functional Limits for tasks assessed                                 General Comments: appears [facility] for basic tasks, increased pain and anxiety with limited moblization in bed    General Comments  pt on bipap during session, HR 120 baseline up to 133; SpO2 down to 85% up to 93% at end of session and RR max 44    Exercises     Shoulder Instructions      Home Living Family/patient expects to be discharged to:: Private residence Living Arrangements: Spouse/significant other Available Help at Discharge: Family;Available 24 hours/day Type of Home: House Home Access: Stairs to enter [employer] of Steps: 2 Entrance Stairs-Rails: None Home Layout: One level     Bathroom Shower/Tub: Teacher, early years/pre: Standard     Home Equipment: Environmental consultant - 2 wheels;Cane - single point;Bedside commode   Additional Comments: interested in bench for tub      Prior Functioning/Environment Level of Independence: Independent        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        OT Problem List: Decreased strength;Decreased activity tolerance;Decreased safety awareness;Decreased knowledge of use of DME or AE;Decreased knowledge of precautions;Cardiopulmonary status limiting activity;Pain      OT Treatment/Interventions: Self-care/ADL training;Therapeutic exercise;DME and/or AE instruction;Therapeutic activities;Cognitive remediation/compensation;Patient/family education;Balance training    OT Goals(Current goals can be found in the care plan section) Acute Rehab OT Goals Patient Stated Goal: less pain OT Goal Formulation: With patient Time For Goal Achievement: 08/26/21 Potential to Achieve Goals: Fair  OT Frequency: Min 2X/week   Barriers to D/C:            Co-evaluation PT/OT/SLP Co-Evaluation/Treatment: Yes Reason for Co-Treatment: For patient/therapist safety;Complexity of the patient's impairments (multi-system involvement);To address functional/ADL transfers;Necessary to address cognition/behavior during functional activity   OT goals addressed during session: ADL's and self-care      AM-PAC OT "6 Clicks"  Daily Activity     Outcome Measure Help from another person eating meals?: Total (on bipap) Help from another person taking care of personal grooming?: A Little Help from another person toileting, which includes using toliet, bedpan, or urinal?: Total Help from another person bathing (including washing, rinsing, drying)?: A Lot Help from another person to put on and taking off regular upper body clothing?: A Lot Help from another person to put on and taking off regular lower body clothing?: Total 6 Click Score: 10   End of Session Equipment Utilized During Treatment: Oxygen (via bipap) Nurse Communication: Mobility status;Patient requests pain meds  Activity Tolerance: Patient limited by pain;Other (comment) (anxiety) Patient left: in bed;with call bell/phone within reach;with nursing/sitter in room;with family/visitor present  OT Visit Diagnosis: Pain;Other abnormalities of gait and mobility (R26.89) Pain - part of body:  (back)                Time: [id number] OT Time Calculation (min): 20 min Charges:  OT General Charges $OT Visit: 1 Visit OT Evaluation $OT Eval Moderate Complexity: 1 Mod  Jolaine Artist, OT Acute Rehabilitation Services Pager [phone] Office [phone]   Delight Stare 08/12/2021, 1:57 PM

## 2021-08-12 NOTE — Progress Notes (Signed)
Palliative care brief note (late entry)  Palliative care consult placed per patient's wife's request as I followed him during recent admission at [facility] for symptom management as well as psychosocial support.   Unfortunately, Jose [name]'s symptoms worsened following discharge home and he returned to the hospital with progression of osteomyelitis despite antibiotics and underwent neurosurgical intervention.  His condition continued to deteriorate in regard to respiratory status and he is currently on continuous BiPAP and with high risk of needing intubation.  I saw and examined him today.  He was lying in bed in some distress related to pain.  Lidocaine patch and been ordered and applied and he reports that he does think it feels a little bit better since this occurred.  He appeared very tired.  I called and spoke with Jose [name]'s wife, Jose [name], today.  Jose [name] reports that communication regarding [name]'s care has fortunately been much better this admission.  She has a good understanding about how ill he is and expressed fear that he may need to be intubated soon.  She was appropriately tearful as she expressed being worried he may not survive this hospitalization.  She remains hopeful for improvement and is appreciative of all the care he has received.  She said she had no specific needs at this time but is appreciative of continued support.  I am out of the hospital tomorrow, but I will plan to check in with Erin on Thursday.  At this point, goal of visit is for support for Jose [name].  We may need to have further conversations regarding goals of care depending on his clinical course over the next several days.  Will defer this to PCCM as they are primary service, but I am happy to participate in any way that may be beneficial.  Micheline Rough, MD [place] Team [phone]  NO CHARGE NOTE

## 2021-08-12 NOTE — Progress Notes (Signed)
Inpatient Rehabilitation Admissions Coordinator  Noted patient intubated.   Danne Baxter, RN, MSN Rehab Admissions Coordinator [phone] 08/12/2021 3:41 PM

## 2021-08-12 NOTE — Evaluation (Signed)
Physical Therapy Evaluation Patient Details Name: Jose [name] MRN: [id number] DOB: [birth year] Today's Date: 08/12/2021  History of Present Illness  45 y.o. male admitted on 08/06/2021 with worsening back pain, S/P T4-5 laminectomy, LT5 pediculotomy, and decompression of spinal cord on 10/14.  Pt now with acute hypoxic respiratory failure in setting of recent GI bleed and recent spinal surgery with extensive pain that is impeding his ability to take deep breath. Endoscopy 10/17. PMhx: T2DM, retinopathy, neuropathy, HTN, HLD, CKD, covid July 2022   Clinical Impression  Pt admitted with above diagnosis. At baseline pt is independent and working.  Today, evaluation was very limited due to pain and respiratory status.  At arrival, pt with increased pain, RR, and HR.  Obtained hx and noted pt to be slid down in bed.  Assisted to reposition up in bed to improve pain and breathing position with total A of 2 (using trendlenberg function and pt cannot tolerate laying flat).   Pt became anxious, increased pain, decreased vitals, and required several mins and RN into assist to stabilize.  Further therapy limited. Due to high level of function prior and age - expect pt to progress as pain and resp status improve.  Currently limited goals and treatment plan due to current status - will need updating with progress. Pt currently with functional limitations due to the deficits listed below (see PT Problem List). Pt will benefit from skilled PT to increase their independence and safety with mobility to allow discharge to the venue listed below.           Recommendations for follow up therapy are one component of a multi-disciplinary discharge planning process, led by the attending physician.  Recommendations may be updated based on patient status, additional functional criteria and insurance authorization.  Follow Up Recommendations CIR (will need further assessment with progress)    Equipment Recommendations   Other (comment) (needs further assessment)    Recommendations for Other Services Rehab consult     Precautions / Restrictions Precautions Precautions: Fall;Back Precaution Comments: cortrak, bipap, watch vitals Restrictions Weight Bearing Restrictions: No      Mobility  Bed Mobility Overal bed mobility: Needs Assistance             General bed mobility comments: Pt was slid down in bed.  Attempted to lift in bed for better positioning for pain control and breathing.  Pt was total assist +2 to reposition in bed with pt unable to tolerate further mobility at this time    Transfers                 General transfer comment: deferred  Ambulation/Gait                Stairs            Wheelchair Mobility    Modified Rankin (Stroke Patients Only)       Balance       Sitting balance - Comments: unable                                     Pertinent Vitals/Pain Pain Assessment: 0-10 Pain Score: 10-Worst pain ever Faces Pain Scale: Hurts worst Pain Location: back Pain Descriptors / Indicators: Grimacing;Restless;Discomfort (increased RR) Pain Intervention(s): Limited activity within patient's tolerance;Monitored during session;Repositioned;Patient requesting pain meds-RN notified;Utilized relaxation techniques    Home Living Family/patient expects to be discharged to:: Private residence Living Arrangements:  Spouse/significant other Available Help at Discharge: Family;Available 24 hours/day Type of Home: House Home Access: Stairs to enter Entrance Stairs-Rails: None Entrance Stairs-Number of Steps: 2 Home Layout: One level Home Equipment: Walker - 2 wheels;Cane - single point;Bedside commode Additional Comments: interested in bench for tub    Prior Function Level of Independence: Independent         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 Dominance   Dominant Hand: Right     Extremity/Trunk Assessment   Upper Extremity Assessment Upper Extremity Assessment: Defer to OT evaluation    Lower Extremity Assessment Lower Extremity Assessment:  (Pt moving both legs, appears functional but weak, limited testing due to resp status and pain)    Cervical / Trunk Assessment Cervical / Trunk Assessment: Other exceptions Cervical / Trunk Exceptions: s/p back surgery  Communication   Communication: Other (comment) (on bipap)  Cognition Arousal/Alertness: Awake/alert Behavior During Therapy: Anxious;Restless Overall Cognitive Status: Within Functional Limits for tasks assessed                                 General Comments: appears [facility] for basic tasks, increased pain and anxiety with limited moblization in bed      General Comments General comments (skin integrity, edema, etc.): Pt on Bipap during session with HR 120's, RR 38-40 rest, O2 sats93%.  Attempted repositioning in bed for pain control and better postioning for breathing but pt with increased pain during transfers, anxiety, and RR max 44, sats min 85%, HR max 133 bpm.  Encouraged relaxation, slowing breathing, provided comforting measures (pt hot, got fan and cold wash cloth).  Pt with little improvement.  RN and resp therapist into assist.  Pt returned to HR 120's, RR 38-40, sats 93% after several mins.    Exercises     Assessment/Plan    PT Assessment Patient needs continued PT services  PT Problem List Decreased strength;Decreased activity tolerance;Decreased balance;Decreased mobility;Pain;Decreased range of motion;Cardiopulmonary status limiting activity;Decreased knowledge of use of DME       PT Treatment Interventions DME instruction;Functional mobility training;Therapeutic activities;Therapeutic exercise;Balance training;Patient/family education    PT Goals (Current goals can be found in the Care Plan section)  Acute Rehab PT Goals Patient Stated Goal: less pain PT Goal  Formulation: With patient Time For Goal Achievement: 08/26/21 Potential to Achieve Goals: Good    Frequency Min 3X/week   Barriers to discharge        Co-evaluation PT/OT/SLP Co-Evaluation/Treatment: Yes Reason for Co-Treatment: Complexity of the patient's impairments (multi-system involvement);For patient/therapist safety;Necessary to address cognition/behavior during functional activity;To address functional/ADL transfers PT goals addressed during session: Mobility/safety with mobility OT goals addressed during session: ADL's and self-care       AM-PAC PT "6 Clicks" Mobility  Outcome Measure Help needed turning from your back to your side while in a flat bed without using bedrails?: Total Help needed moving from lying on your back to sitting on the side of a flat bed without using bedrails?: Total Help needed moving to and from a bed to a chair (including a wheelchair)?: Total Help needed standing up from a chair using your arms (e.g., wheelchair or bedside chair)?: Total Help needed to walk in hospital room?: Total Help needed climbing 3-5 steps with a railing? : Total 6 Click Score: 6    End of Session Equipment Utilized During Treatment: Oxygen Activity Tolerance: Treatment limited secondary to  medical complications (Comment) Patient left: in bed;with call bell/phone within reach;with nursing/sitter in room Nurse Communication: Mobility status PT Visit Diagnosis: Pain;Other abnormalities of gait and mobility (R26.89);Muscle weakness (generalized) (M62.81)    Time: [id number] PT Time Calculation (min) (ACUTE ONLY): 23 min   Charges:   PT Evaluation $PT Eval Moderate Complexity: 1 Melina Schools, PT Acute Rehab Services Pager [phone] [facility] [phone]   Karlton Lemon 08/12/2021, 2:22 PM

## 2021-08-12 NOTE — Progress Notes (Addendum)
Hypoglycemic Event  CBG: 64  Treatment: D50 25 mL (12.5 gm)  Symptoms: None  Follow-up CBG: Time: 0017 CBG Result: 112  Possible Reasons for Event: Inadequate meal intake  Notified E-link about Patients subsequent low blood sugars. Patient has been unable to take PO due too respiratory status and need for continuous BiPAP. Awaiting new orders, will continue to monitor patient closely.    Berna Spare

## 2021-08-12 NOTE — Progress Notes (Signed)
[facility] ADULT ICU REPLACEMENT PROTOCOL   The patient does apply for the [facility] Adult ICU Electrolyte Replacment Protocol based on the criteria listed below:   1.Exclusion criteria: TCTS patients, ECMO patients, and Dialysis patients 2. Is GFR >/= 30 ml/min? Yes.    Patient's GFR today is 45 3. Is SCr </= 2? No. Patient's SCr is 1.85 mg/dL 4. Did SCr increase >/= 0.5 in 24 hours? No. 5.Pt's weight >40kg  Yes.   6. Abnormal electrolyte(s): K+ 3.4  7. Electrolytes replaced per protocol 8.  Call MD STAT for K+ </= 2.5, Phos </= 1, or Mag </= 1 Physician:  n/a  Jose [name] 08/12/2021 4:18 AM

## 2021-08-12 NOTE — Progress Notes (Signed)
Briefly took patient off BiPAP and placed on 15L HFNC to do mouth care. Patiet O2 sat 83-85%. Unable to tolerate taking PO meds due to respiratory status. Notified E-Link. MD switched patients PO scheduled Hydralazine to IV route, and instructed to hold scheduled Potassium, to wait for morning lab results. Patient placed back on BiPAP, O2 sat 90-93% will continue to monitor closely.

## 2021-08-12 NOTE — Progress Notes (Signed)
Hypoglycemic Event  CBG: 68    Treatment: D50 25 mL (12.5 gm)  Symptoms: None  Follow-up CBG: Time: 2030 CBG Result: 99  Possible Reasons for Event: Inadequate meal intake    Berna Spare

## 2021-08-12 NOTE — Procedures (Signed)
Intubation Procedure Note  Jose [name]  [id number]  1976-08-26  Date:08/12/21  Time:12:37 PM   Provider Performing:Jeryn Bertoni [place] Payment    Procedure: Intubation (31500)  Indication(s) Respiratory Failure  Consent Risks of the procedure as well as the alternatives and risks of each were explained to the patient and/or caregiver.  Consent for the procedure was obtained and is signed in the bedside chart   Anesthesia Fentanyl and Ketamine   Time Out Verified patient identification, verified procedure, site/side was marked, verified correct patient position, special equipment/implants available, medications/allergies/relevant history reviewed, required imaging and test results available.   Sterile Technique Usual hand hygeine, masks, and gloves were used   Procedure Description Patient positioned in bed supine.  Sedation given as noted above.  Patient was intubated with endotracheal tube using Glidescope.  View was Grade 1 full glottis .  Number of attempts was 1.  Colorimetric CO2 detector was consistent with tracheal placement.   Complications/Tolerance None; patient tolerated the procedure well. Chest X-ray is ordered to verify placement.   EBL Minimal   Specimen(s) None   [name], D.O.  Internal Medicine Resident, PGY-3 [facility]  Pager: [phone] 12:38 PM, 08/12/2021

## 2021-08-12 NOTE — Progress Notes (Signed)
[place] Progress Note Patient Name: Jose [name] DOB: [birth year] MRN: [id number]   Date of Service  08/12/2021  HPI/Events of Note  CBG low  eICU Interventions  Added D5LR 30/hr     Intervention Category Intermediate Interventions: Other:  Tilden Dome 08/12/2021, 2:21 AM

## 2021-08-12 NOTE — Progress Notes (Addendum)
Met wife in hall crying. Inquired of her her concerns, she said her husband was being intubated and she is afraid he is going to die.  I visited with pt. And spoke with pt. Nurse who said in a few days they expect pt . to recover.   Provided emotional support to wife and prayed at bedside for patient.  Chaplain available as needed.  Jaclynn Major, [place], [facility], Pager [phone]

## 2021-08-12 NOTE — Progress Notes (Signed)
Inpatient Rehab Admissions Coordinator:   Per therapy recommendations, patient was screened for CIR candidacy by Clemens Catholic, MS, CCC-SLP. At this time, Pt. is total+2 for bed mobility and not attempting OOB. I do not think Pt. Is able to tolerate intensity of CIR at this time. However,  Pt. may have potential to progress to becoming a potential CIR candidate, so CIR admissions team will follow and monitor for progress and participation with therapies and place consult order if Pt. appears to be an appropriate candidate. Please contact me with any questions.    Also note, current CIR guidelines for COVID: Patients are eligible to be considered for admit to the [place] when cleared from airborne precautions by Acute MD, or Infectious Disease MD.  Otherwise, they will need to be >20 days from their positive test with recovery/improvement in symptoms or 2 negative tests.     Clemens Catholic, [place], [place] Admissions Coordinator  [phone] ([place]) [phone] (office)

## 2021-08-12 NOTE — Progress Notes (Signed)
Nutrition Follow-up  DOCUMENTATION CODES:   Not applicable  INTERVENTION:   Initiate tube feeds via Cortrak: - Start Vital 1.5 @ 20 ml/hr and advance by 10 ml q 8 hours to goal rate of 50 ml/hr (1200 ml/day) - ProSource TF 45 ml TID  Tube feeding regimen at goal provides 1920 kcal, 114 grams of protein, and 917 ml of H2O.   Monitor magnesium, potassium, and phosphorus twice daily for at least 3 days, MD to replete as needed, as pt is at risk for refeeding syndrome given inadequate nutrition since admission (x 6 days).  NUTRITION DIAGNOSIS:   Increased nutrient needs related to post-op healing as evidenced by estimated needs.  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 Pt was discharged home with abx but reports significant pain and inability to walk or sit-up since being discharged.  10/14 - s/p T4-T5 laminectomy, left T5 pediculotomy for decompression of spinal cord, intraoperative ultrasound, intraoperative microscope 10/16 - dark brown/red emesis 10/17 - s/p EGD showing esophagitis, hypoxia and PCCM consult, transferred to ICU 10/19 - intubated, Cortrak placed (tip gastric)  Discussed pt with RN and during ICU rounds. Pt intubated today. Consult received for tube feeding initiation and management. Cortrak placed today, tip gastric.  Pt has been NPO/clear liquids since 10/14. Pt with brief period of time on Heart Healthy/Carb modified diet on 10/13. Meal completions have all been 0%. Pt at risk for  refeeding. Will start tube feeds at low rate and slowly advance. RN aware.  Admit weight: 86.2 kg Current weight: 89.3 kg  Patient is currently intubated on ventilator support MV: 8.2 L/min Temp (24hrs), Avg:99.1 F (37.3 C), Min:97.8 F (36.6 C), Max:101.5 F (38.6 C)  Drips: Fentanyl D5 in LR: 30 ml/hr  Medications reviewed and include: diflucan, SSI q 4 hours, levemir 8 units BID, lactulose, MVI with minerals daily, movantik, protonix, miralax, senna, IV abx  Labs reviewed: BUN 46, creatinine 1.85 CBG's: 64-145 x 24 hours  UOP: 2575 ml x 24 hours I/O's: -1.1 L since admit  Diet Order:   Diet Order             Diet clear liquid Room service appropriate? Yes; Fluid consistency: Thin  Diet effective now                   EDUCATION NEEDS:   No education needs have been identified at this time  Skin:  Skin Assessment: Skin Integrity Issues Stage II: coccyx Diabetic Ulcer: L big toe Incisions: back  Last BM:  08/12/21 small type 7  Height:   Ht Readings from Last 1 Encounters:  08/06/21 5' 11"  (1.803 m)    Weight:   Wt Readings from Last 1 Encounters:  08/09/21 89.3 kg    BMI:  Body mass index is 27.46 kg/m.  Estimated Nutritional Needs:   Kcal:  1900-2100  Protein:  110-130 grams  Fluid:  >2L/d    Gustavus Bryant, MS, RD, LDN Inpatient Clinical Dietitian Please see AMiON for  contact information.

## 2021-08-12 NOTE — Progress Notes (Addendum)
I have seen and examined the patient. I have personally reviewed the clinical findings, laboratory findings, microbiological data and imaging studies. The assessment and treatment plan was discussed with the Nurse Practitioner Mauricio Po. I agree with her/his recommendations except following additions/corrections.  Febrile 101. 5 with WBC up to 37.2 He is on BIPAP , Fio2 100% Mental status intact  Chest pain better than yesterday  EGD 10/17 with candidal esophagitis   Blood cx *2 and respiratory cultures '( ordered) ? Needs CT chest/thoracentesis for possible enlarging pleural effusion/abscess Continue Unasyn  Start Fluconazole for candida esophagitis ( qtc 419) Monitor CBC and BMP  Respiratory status management per PCCM  Rosiland Oz, MD Infectious Disease Physician [facility] [address] [place], [place] [address] Phone: [phone]  Fax: [place] for Infectious Disease  Date of Admission:  08/06/2021     Total days of antibiotics 20         ASSESSMENT:  Jose [name] remains on Bipap with chest x-ray showing worsening airspace disease. Fever and increased leukocytosis likely multifactorial with worsening pneumonia, GI bleed/severe esphogitis/ulcer s/p EGD, and thoracic osteomyelitis. Consider respiratory culture. Pneumonia should be adequately covered with Unasyn with no previous evidence of MRSA (MRSA PCR negative). Continue respiratory support and medical care per CCM.   PLAN:  Continue Unasyn. Respiratory and supportive care per CCM.  ID will continue to follow.   Principal Problem:   Osteomyelitis of thoracic spine (HCC) Active Problems:   Type 2 diabetes mellitus with diabetic polyneuropathy, without long-term current use of insulin (HCC)   Hyperlipidemia   GAD (generalized anxiety disorder)   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)   Essential hypertension   CKD (chronic kidney  disease) stage 3, GFR 30-59 ml/min (HCC)   Pleural effusion, bilateral    (feeding supplement) PROSource Plus  30 mL Oral BID BM   sodium chloride   Intravenous Once   amLODipine  10 mg Oral Daily   baclofen  10 mg Oral TID   chlorhexidine  15 mL Mouth Rinse BID   Chlorhexidine Gluconate Cloth  6 each Topical Daily   feeding supplement  1 Container Oral TID BM   feeding supplement (GLUCERNA SHAKE)  237 mL Oral TID BM   gabapentin  300 mg Oral BID   guaiFENesin  600 mg Oral BID   insulin aspart  0-20 Units Subcutaneous Q4H   insulin detemir  10 Units Subcutaneous BID   lactulose  20 g Oral BID   lidocaine  1 patch Transdermal Q24H   living well with diabetes book   Does not apply Once   mouth rinse  15 mL Mouth Rinse q12n4p   multivitamin with minerals  1 tablet Oral Daily   mupirocin ointment  1 application Topical BID   naloxegol oxalate  25 mg Oral Daily   oxyCODONE  15 mg Oral Q12H   pantoprazole (PROTONIX) IV  40 mg Intravenous Q12H   polyethylene glycol  17 g Oral TID   senna-docusate  1 tablet Oral BID   sodium chloride flush  10-40 mL Intracatheter Q12H   sodium chloride flush  3 mL Intravenous Q12H   traZODone  100 mg Oral QHS    SUBJECTIVE:  Febrile overnight with increasing leukocytosis. Remains on Bipap and was unable to tolerate high flow [place].   Allergies  Allergen Reactions   Cranberry Itching     Review of Systems: Review of Systems  Constitutional:  Positive for malaise/fatigue. Negative for chills, fever and weight loss.  Respiratory:  Positive for shortness of breath. Negative for cough and wheezing.   Cardiovascular:  Positive for chest pain. Negative for leg swelling.  Gastrointestinal:  Negative for abdominal pain, constipation, diarrhea, nausea and vomiting.  Skin:  Negative for rash.     OBJECTIVE: Vitals:   08/12/21 0600 08/12/21 0630 08/12/21 0700 08/12/21 0747  BP: (!) 125/112 138/76 140/84   Pulse: (!) 115 (!) 114 (!) 117   Resp: (!)  24 (!) 22 (!) 30   Temp:    98.6 F (37 C)  TempSrc:    Axillary  SpO2: 93% 95% 94%   Weight:      Height:       Body mass index is 27.46 kg/m.  Physical Exam Constitutional:      General: He is not in acute distress.    Appearance: He is well-developed. He is ill-appearing.  Cardiovascular:     Rate and Rhythm: Regular rhythm. Tachycardia present.     Heart sounds: Normal heart sounds.  Pulmonary:     Effort: Pulmonary effort is normal. Tachypnea present.     Breath sounds: Normal breath sounds.  Skin:    General: Skin is warm and dry.  Neurological:     Mental Status: He is alert and oriented to person, place, and time.  Psychiatric:        Behavior: Behavior normal.        Thought Content: Thought content normal.        Judgment: Judgment normal.    Lab Results Lab Results  Component Value Date   WBC 37.2 (H) 08/12/2021   HGB 7.5 (L) 08/12/2021   HCT 23.2 (L) 08/12/2021   MCV 87.9 08/12/2021   PLT 452 (H) 08/12/2021    Lab Results  Component Value Date   CREATININE 1.85 (H) 08/12/2021   BUN 46 (H) 08/12/2021   NA 136 08/12/2021   K 3.4 (L) 08/12/2021   CL 104 08/12/2021   CO2 23 08/12/2021    Lab Results  Component Value Date   ALT 12 08/12/2021   AST 60 (H) 08/12/2021   ALKPHOS 275 (H) 08/12/2021   BILITOT 0.9 08/12/2021     Microbiology: Recent Results (from the past 240 hour(s))  Culture, blood (routine x 2)     Status: None   Collection Time: 08/06/21  9:10 AM   Specimen: BLOOD  Result Value Ref Range Status   Specimen Description BLOOD SITE NOT SPECIFIED  Final   Special Requests   Final    BOTTLES DRAWN AEROBIC AND ANAEROBIC Blood Culture results may not be optimal due to an inadequate volume of blood received in culture bottles   Culture   Final    NO GROWTH 5 DAYS Performed at [facility], [place] [address] [place], [place] [address]    Report Status 08/11/2021 FINAL  Final  Resp Panel by RT-PCR (Flu A&B, Covid) Nasopharyngeal  Swab     Status: Abnormal   Collection Time: 08/06/21  9:36 AM   Specimen: Nasopharyngeal Swab; Nasopharyngeal(NP) swabs in vial transport medium  Result Value Ref Range Status   SARS Coronavirus 2 by RT PCR POSITIVE (A) NEGATIVE Final    Comment: rn sarah b. [id number] fcp (NOTE) SARS-CoV-2 target nucleic acids are DETECTED.  The SARS-CoV-2 RNA is generally detectable in upper respiratory specimens during the acute phase of infection. Positive results are indicative of the presence of  the identified virus, but do not rule out bacterial infection or co-infection with other pathogens not detected by the test. Clinical correlation with patient history and other diagnostic information is necessary to determine patient infection status. The expected result is Negative.  Fact Sheet for Patients: EntrepreneurPulse.com.au  Fact Sheet for Healthcare Providers: IncredibleEmployment.be  This test is not yet approved or cleared by the [place] FDA and  has been authorized for detection and/or diagnosis of SARS-CoV-2 by FDA under an Emergency Use Authorization (EUA).  This EUA will remain in effect (meaning this test can be used) for the duration of  the COVID-19 declaration und er Section 564(b)(1) of the Act, 21 U.S.C. section 360bbb-3(b)(1), unless the authorization is terminated or revoked sooner.     Influenza A by PCR NEGATIVE NEGATIVE Final   Influenza B by PCR NEGATIVE NEGATIVE Final    Comment: (NOTE) The Xpert Xpress SARS-CoV-2/FLU/RSV plus assay is intended as an aid in the diagnosis of influenza from Nasopharyngeal swab specimens and should not be used as a sole basis for treatment. Nasal washings and aspirates are unacceptable for Xpert Xpress SARS-CoV-2/FLU/RSV testing.  Fact Sheet for Patients: EntrepreneurPulse.com.au  Fact Sheet for Healthcare Providers: IncredibleEmployment.be  This test  is not yet approved or cleared by the [place] FDA and has been authorized for detection and/or diagnosis of SARS-CoV-2 by FDA under an Emergency Use Authorization (EUA). This EUA will remain in effect (meaning this test can be used) for the duration of the COVID-19 declaration under Section 564(b)(1) of the Act, 21 U.S.C. section 360bbb-3(b)(1), unless the authorization is terminated or revoked.  Performed at [facility], [place] [address], [place], [place] [address]   Culture, blood (routine x 2)     Status: None   Collection Time: 08/06/21  9:37 AM   Specimen: BLOOD RIGHT HAND  Result Value Ref Range Status   Specimen Description BLOOD RIGHT HAND  Final   Special Requests   Final    BOTTLES DRAWN AEROBIC AND ANAEROBIC Blood Culture adequate volume   Culture   Final    NO GROWTH 5 DAYS Performed at [facility], [place] [address], [place], [place] [address]    Report Status 08/11/2021 FINAL  Final  Urine Culture     Status: None   Collection Time: 08/06/21  1:50 PM   Specimen: Urine, Clean Catch  Result Value Ref Range Status   Specimen Description URINE, CLEAN CATCH  Final   Special Requests NONE  Final   Culture   Final    NO GROWTH Performed at [facility], [place] [address], [place], [place] [address]    Report Status 08/07/2021 FINAL  Final  Surgical pcr screen     Status: None   Collection Time: 08/06/21  7:45 PM   Specimen: Nasal Mucosa; Nasal Swab  Result Value Ref Range Status   MRSA, PCR NEGATIVE NEGATIVE Final   Staphylococcus aureus NEGATIVE NEGATIVE Final    Comment: (NOTE) The Xpert SA Assay (FDA approved for NASAL specimens in patients 27 years of age and older), is one component of a comprehensive surveillance program. It is not intended to diagnose infection nor to guide or monitor treatment. Performed at [facility], [place] [address] [place], [place] [address]   Aerobic/Anaerobic Culture w Gram Stain (surgical/deep wound)      Status: None (Preliminary result)   Collection Time: 08/07/21  5:04 PM   Specimen: PATH Other; Tissue  Result Value  Ref Range Status   Specimen Description ABSCESS  Final   Special Requests EPIDURAL SWABS PT ON ANCEF VANC MAXIPIME  Final   Gram Stain   Final    NO WBC SEEN NO ORGANISMS SEEN Performed at [facility], [address] [place], [place] [address]    Culture   Final    RARE STAPHYLOCOCCUS AUREUS NO ANAEROBES ISOLATED; CULTURE IN PROGRESS FOR 5 DAYS    Report Status PENDING  Incomplete   Organism ID, Bacteria STAPHYLOCOCCUS AUREUS  Final      Susceptibility   Staphylococcus aureus - MIC*    CIPROFLOXACIN <=0.5 SENSITIVE Sensitive     ERYTHROMYCIN RESISTANT Resistant     GENTAMICIN <=0.5 SENSITIVE Sensitive     OXACILLIN <=0.25 SENSITIVE Sensitive     TETRACYCLINE <=1 SENSITIVE Sensitive     VANCOMYCIN 1 SENSITIVE Sensitive     TRIMETH/SULFA <=10 SENSITIVE Sensitive     CLINDAMYCIN RESISTANT Resistant     RIFAMPIN <=0.5 SENSITIVE Sensitive     Inducible Clindamycin POSITIVE Resistant     * RARE STAPHYLOCOCCUS AUREUS  Aerobic/Anaerobic Culture w Gram Stain (surgical/deep wound)     Status: None (Preliminary result)   Collection Time: 08/07/21  5:05 PM   Specimen: PATH Other; Tissue  Result Value Ref Range Status   Specimen Description TISSUE  Final   Special Requests EPIDURAL PT ON VANC MAXIPIME ANCEF  Final   Gram Stain   Final    NO WBC SEEN NO ORGANISMS SEEN Performed at [facility], [address] [place], [place] [address]    Culture   Final    RARE STAPHYLOCOCCUS AUREUS SUSCEPTIBILITIES PERFORMED ON PREVIOUS CULTURE WITHIN THE LAST 5 DAYS. RESULT CALLED TO, READ BACK BY AND VERIFIED WITH: RN L.ILLERBRUN ON AS:[id number] AT 49 BY E.PARRISH NO ANAEROBES ISOLATED; CULTURE IN PROGRESS FOR 5 DAYS    Report Status PENDING  Incomplete     Terri Piedra, NP [place] [facility] for Infectious Disease [facility]  08/12/2021  8:32  AM

## 2021-08-12 NOTE — Procedures (Signed)
Cortrak  Person Inserting Tube:  Linwood Gullikson, Creola Corn, RD Tube Type:  Cortrak - 43 inches Tube Size:  10 Tube Location:  Left nare Initial Placement:  Stomach Secured by: Bridle Technique Used to Measure Tube Placement:  Marking at nare/corner of mouth Cortrak Secured At:  69 cm  Cortrak Tube Team Note:  Consult received to place a Cortrak feeding tube.   X-ray is required, abdominal x-ray has been ordered by the Cortrak team. Please confirm tube placement before using the Cortrak tube.   If the tube becomes dislodged please keep the tube and contact the Cortrak team at [email or web address] (password [email or web address]) for replacement.  If after hours and replacement cannot be delayed, place a NG tube and confirm placement with an abdominal x-ray.    Larkin Ina, MS, RD, LDN (she/her/hers) RD pager number and weekend/on-call pager number located in [place].

## 2021-08-13 ENCOUNTER — Inpatient Hospital Stay (HOSPITAL_COMMUNITY): Payer: 59

## 2021-08-13 DIAGNOSIS — Z7189 Other specified counseling: Secondary | ICD-10-CM

## 2021-08-13 DIAGNOSIS — J9691 Respiratory failure, unspecified with hypoxia: Secondary | ICD-10-CM | POA: Diagnosis not present

## 2021-08-13 DIAGNOSIS — M869 Osteomyelitis, unspecified: Secondary | ICD-10-CM | POA: Diagnosis not present

## 2021-08-13 DIAGNOSIS — Z515 Encounter for palliative care: Secondary | ICD-10-CM | POA: Diagnosis not present

## 2021-08-13 DIAGNOSIS — M4624 Osteomyelitis of vertebra, thoracic region: Secondary | ICD-10-CM | POA: Diagnosis not present

## 2021-08-13 LAB — BASIC METABOLIC PANEL
Anion gap: 9 (ref 5–15)
BUN: 55 mg/dL — ABNORMAL HIGH (ref 6–20)
CO2: 22 mmol/L (ref 22–32)
Calcium: 8.4 mg/dL — ABNORMAL LOW (ref 8.9–10.3)
Chloride: 109 mmol/L (ref 98–111)
Creatinine, Ser: 1.89 mg/dL — ABNORMAL HIGH (ref 0.61–1.24)
GFR, Estimated: 44 mL/min — ABNORMAL LOW (ref >60)
Glucose, Bld: 208 mg/dL — ABNORMAL HIGH (ref 70–99)
Potassium: 3.9 mmol/L (ref 3.5–5.1)
Sodium: 140 mmol/L (ref 135–145)

## 2021-08-13 LAB — POCT I-STAT 7, (LYTES, BLD GAS, ICA,H+H)
Acid-base deficit: 2 mmol/L (ref 0.0–2.0)
Bicarbonate: 23 mmol/L (ref 20.0–28.0)
Calcium, Ion: 1.22 mmol/L (ref 1.15–1.40)
HCT: 20 % — ABNORMAL LOW (ref 39.0–52.0)
Hemoglobin: 6.8 g/dL — CL (ref 13.0–17.0)
O2 Saturation: 97 %
Patient temperature: 100.4
Potassium: 4 mmol/L (ref 3.5–5.1)
Sodium: 142 mmol/L (ref 135–145)
TCO2: 24 mmol/L (ref 22–32)
pCO2 arterial: 38.6 mmHg (ref 32.0–48.0)
pH, Arterial: 7.388 (ref 7.350–7.450)
pO2, Arterial: 94 mmHg (ref 83.0–108.0)

## 2021-08-13 LAB — CBC WITH DIFFERENTIAL/PLATELET
Abs Immature Granulocytes: 1.27 10*3/uL — ABNORMAL HIGH (ref 0.00–0.07)
Basophils Absolute: 0.1 10*3/uL (ref 0.0–0.1)
Basophils Relative: 0 %
Eosinophils Absolute: 0.6 10*3/uL — ABNORMAL HIGH (ref 0.0–0.5)
Eosinophils Relative: 2 %
HCT: 21.8 % — ABNORMAL LOW (ref 39.0–52.0)
Hemoglobin: 7.1 g/dL — ABNORMAL LOW (ref 13.0–17.0)
Immature Granulocytes: 4 %
Lymphocytes Relative: 2 %
Lymphs Abs: 0.8 10*3/uL (ref 0.7–4.0)
MCH: 29.2 pg (ref 26.0–34.0)
MCHC: 32.6 g/dL (ref 30.0–36.0)
MCV: 89.7 fL (ref 80.0–100.0)
Monocytes Absolute: 2.4 10*3/uL — ABNORMAL HIGH (ref 0.1–1.0)
Monocytes Relative: 7 %
Neutro Abs: 30.2 10*3/uL — ABNORMAL HIGH (ref 1.7–7.7)
Neutrophils Relative %: 85 %
Platelets: 412 10*3/uL — ABNORMAL HIGH (ref 150–400)
RBC: 2.43 MIL/uL — ABNORMAL LOW (ref 4.22–5.81)
RDW: 14.4 % (ref 11.5–15.5)
WBC: 35.2 10*3/uL — ABNORMAL HIGH (ref 4.0–10.5)
nRBC: 0 % (ref 0.0–0.2)

## 2021-08-13 LAB — GLUCOSE, CAPILLARY
Glucose-Capillary: 162 mg/dL — ABNORMAL HIGH (ref 70–99)
Glucose-Capillary: 172 mg/dL — ABNORMAL HIGH (ref 70–99)
Glucose-Capillary: 176 mg/dL — ABNORMAL HIGH (ref 70–99)
Glucose-Capillary: 201 mg/dL — ABNORMAL HIGH (ref 70–99)
Glucose-Capillary: 218 mg/dL — ABNORMAL HIGH (ref 70–99)
Glucose-Capillary: 228 mg/dL — ABNORMAL HIGH (ref 70–99)
Glucose-Capillary: 245 mg/dL — ABNORMAL HIGH (ref 70–99)

## 2021-08-13 LAB — MAGNESIUM
Magnesium: 2.2 mg/dL (ref 1.7–2.4)
Magnesium: 2.4 mg/dL (ref 1.7–2.4)

## 2021-08-13 LAB — PREPARE RBC (CROSSMATCH)

## 2021-08-13 LAB — PHOSPHORUS
Phosphorus: 3.8 mg/dL (ref 2.5–4.6)
Phosphorus: 3.5 mg/dL (ref 2.5–4.6)

## 2021-08-13 LAB — PROCALCITONIN: Procalcitonin: 3.29 ng/mL

## 2021-08-13 IMAGING — DX DG CHEST 1V PORT
1 series · 1 of 1 positions shown · non-contrast
Comparison: [DATE]

CLINICAL DATA: Ventilator dependent

EXAM:
PORTABLE CHEST 1 VIEW

[chest ap]
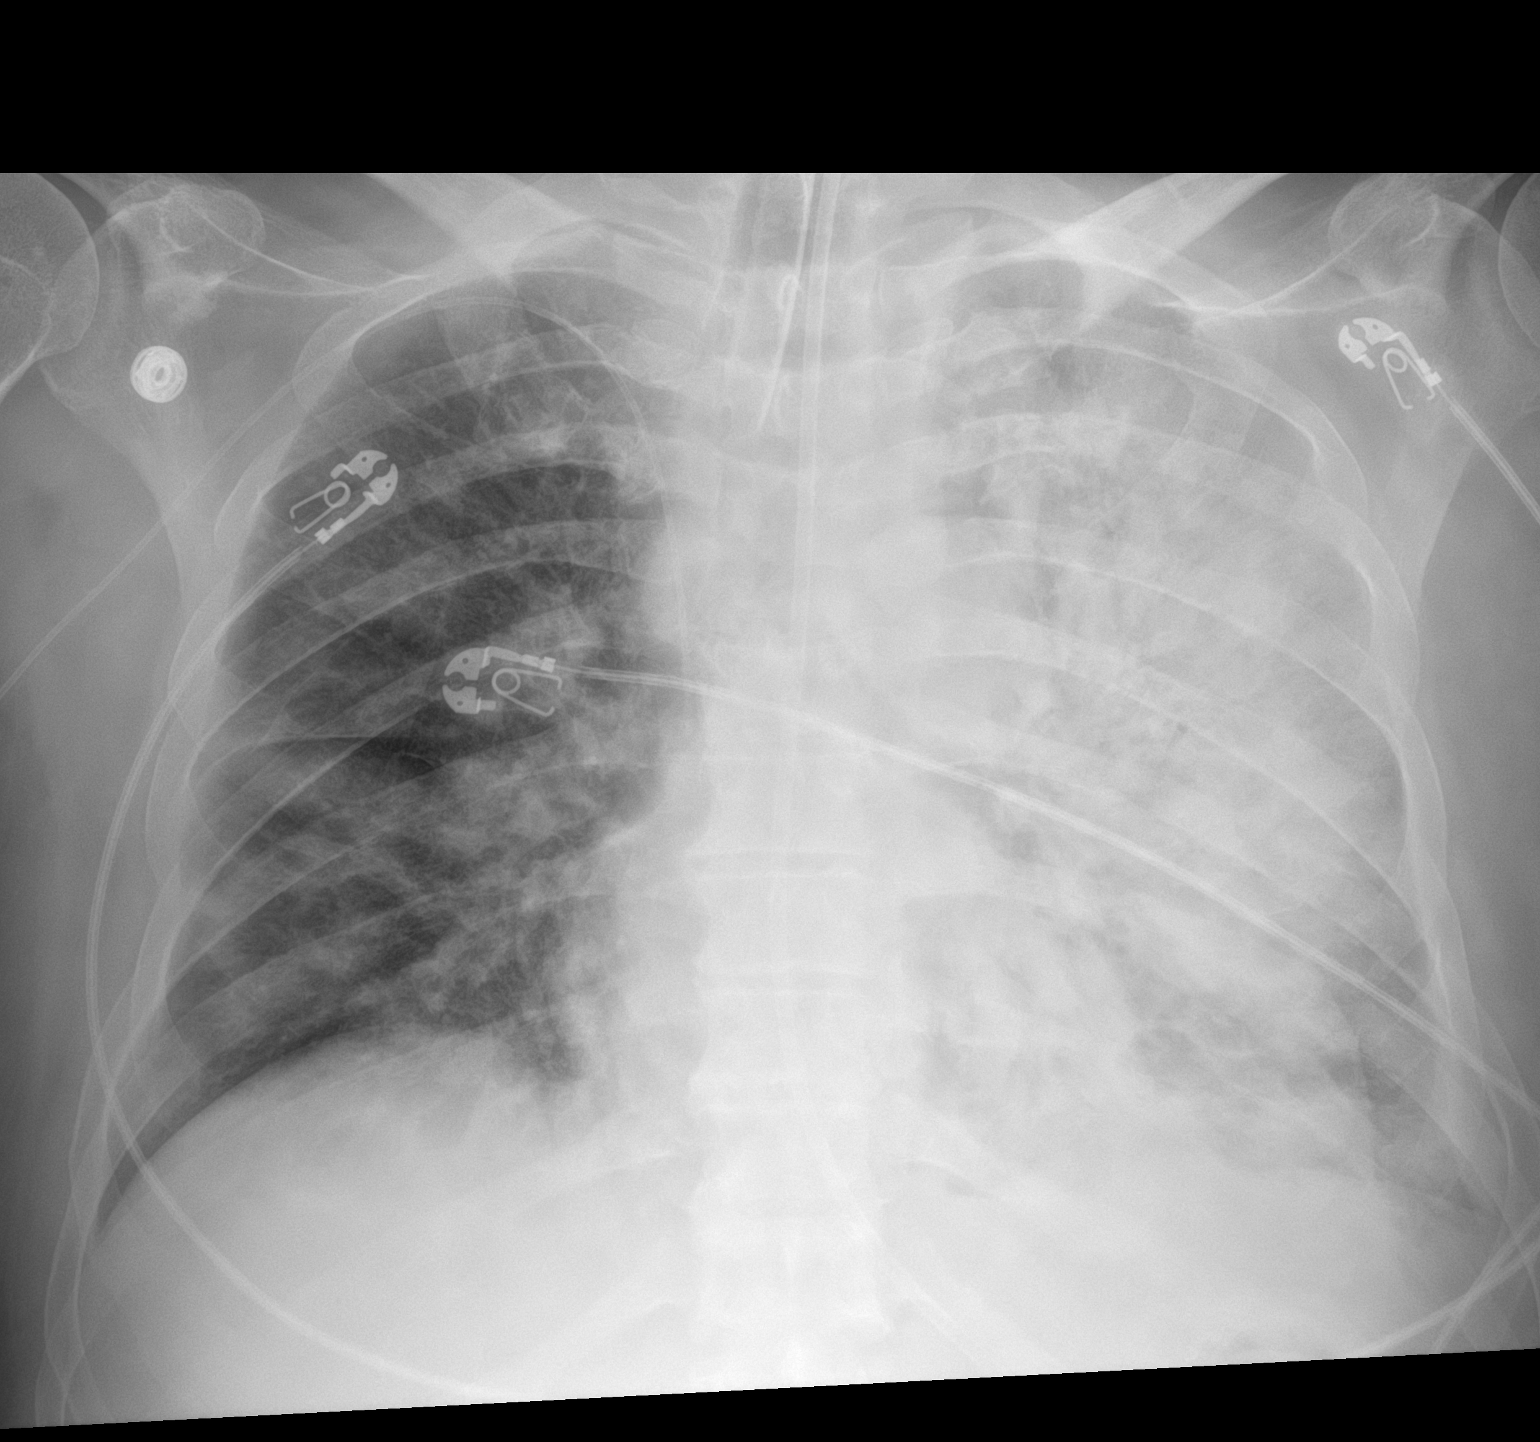

[1 of 1 positions shown; findings below may reference images not displayed]

FINDINGS: Endotracheal tube terminates 5 cm above the carina.

Enteric tube courses to the stomach.

Multifocal patchy opacities throughout the left lung.

Mild patchy opacities in the right lung.

No pneumothorax.

The heart is normal in size.

Right arm PICC terminates at the cavoatrial junction.
IMPRESSION: Endotracheal tube terminates 5 cm above the carina. Additional
support apparatus as above.

Multifocal pneumonia, left lung predominant.

## 2021-08-13 MED ORDER — DEXAMETHASONE SODIUM PHOSPHATE 10 MG/ML IJ SOLN
6.0000 mg | INTRAMUSCULAR | Status: DC
Start: 2021-08-13 — End: 2021-08-13
  Administered 2021-08-13: 6 mg via INTRAVENOUS
  Filled 2021-08-13: qty 1

## 2021-08-13 MED ORDER — SODIUM CHLORIDE 0.9% IV SOLUTION: Freq: Once | INTRAVENOUS | Status: AC

## 2021-08-13 MED ORDER — OXYCODONE HCL 5 MG PO TABS
10.0000 mg | ORAL_TABLET | Freq: Four times a day (QID) | ORAL | Status: DC
  Administered 2021-08-13 – 2021-08-19 (×22): 10 mg
  Filled 2021-08-13 (×22): qty 2

## 2021-08-13 MED ORDER — PROPOFOL 1000 MG/100ML IV EMUL
0.0000 ug/kg/min | INTRAVENOUS | Status: DC
  Administered 2021-08-13: 20 ug/kg/min via INTRAVENOUS
  Administered 2021-08-13: 10 ug/kg/min via INTRAVENOUS
  Administered 2021-08-13: 5 ug/kg/min via INTRAVENOUS
  Administered 2021-08-14: 10 ug/kg/min via INTRAVENOUS
  Administered 2021-08-14 – 2021-08-15 (×3): 30 ug/kg/min via INTRAVENOUS
  Administered 2021-08-15: 20 ug/kg/min via INTRAVENOUS
  Filled 2021-08-13: qty 100
  Filled 2021-08-13: qty 200
  Filled 2021-08-13 (×8): qty 100

## 2021-08-13 MED ORDER — MAGNESIUM HYDROXIDE 400 MG/5ML PO SUSP
30.0000 mL | Freq: Once | ORAL | Status: AC
  Administered 2021-08-14: 30 mL via ORAL
  Filled 2021-08-13: qty 30

## 2021-08-13 MED ORDER — LIDOCAINE 5 % EX PTCH
2.0000 | MEDICATED_PATCH | CUTANEOUS | Status: DC
  Administered 2021-08-15 – 2021-08-21 (×7): 2 via TRANSDERMAL
  Administered 2021-08-22 – 2021-08-23 (×2): 1 via TRANSDERMAL
  Filled 2021-08-13 (×8): qty 2

## 2021-08-13 MED ORDER — BISACODYL 10 MG RE SUPP
10.0000 mg | Freq: Every day | RECTAL | Status: DC | PRN
  Administered 2021-08-14: 10 mg via RECTAL
  Filled 2021-08-13: qty 1

## 2021-08-13 MED ORDER — METHYLNALTREXONE BROMIDE 12 MG/0.6ML ~~LOC~~ SOLN
12.0000 mg | Freq: Once | SUBCUTANEOUS | Status: AC
  Administered 2021-08-14: 12 mg via SUBCUTANEOUS
  Filled 2021-08-13: qty 0.6

## 2021-08-13 MED ORDER — OXYCODONE HCL 5 MG PO TABS
10.0000 mg | ORAL_TABLET | Freq: Four times a day (QID) | ORAL | Status: DC
  Administered 2021-08-13: 10 mg via ORAL
  Filled 2021-08-13: qty 2

## 2021-08-13 MED ORDER — INSULIN DETEMIR 100 UNIT/ML ~~LOC~~ SOLN
15.0000 [IU] | Freq: Two times a day (BID) | SUBCUTANEOUS | Status: DC
  Administered 2021-08-13 – 2021-08-14 (×3): 15 [IU] via SUBCUTANEOUS
  Filled 2021-08-13 (×5): qty 0.15

## 2021-08-13 NOTE — Progress Notes (Addendum)
NAME:  Jose [name], MRN:  [id number], DOB:  03-Jul-1976, LOS: 7 ADMISSION DATE:  08/06/2021, CONSULTATION DATE:  08/10/2021 REFERRING MD:  TRH, CHIEF COMPLAINT:  Hypoxia   Brief History   Jose [name] is a 45 y.o. 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 On 10/17 patient developed coffee-ground emesis, now s/p EGD showing circumferential esophagitis with hemorrhage and non-bleeding duodenal ulcers now on BID PPI. On 10/17, pccm was consulted for acute hypoxia.   Past Medical History   has a past medical history of GAD (generalized anxiety disorder), Hyperlipidemia, Macular degeneration, bilateral, Retinopathy, and Type II diabetes mellitus with complication, uncontrolled. Significant Hospital Events   10/13 -admitted with worsening thoracic osteomyelitis, constipation/abd distention, neurosurgery consulted  10/14 - T4-T5 laminectomy, left T5 pediculotomy, and spinal cord decompression 10/17 - Hgb drop to 6.2, coffee ground emesis s/p EGD with esophagitis and peptic ulcer 10/17 - hypoxia and pccm consult   Consults:  Neurosurgery Infection disease PCCM  Procedures:  10/14 - T4-T5 laminectomy, left T5 pediculotomy, and spinal cord decompression 10/17 - EGD  Significant Diagnostic Tests:  CT Abd/Pelvis Result Date: 08/06/2021 IMPRESSION: 1. Generally large burden of stool and stool balls throughout the colon. 2. Distended urinary bladder, measuring up to 19.0 cm. Correlate for urinary retention. 3. Anasarca.  CTA: Result Date: 08/06/2021 1. Negative examination for pulmonary embolism. 2.  Moderate bilateral pleural effusions and associated atelectasis or consolidation, new compared to prior examination. 3. There is substantial interval worsening of destructive lytic change about the T4-T5 disc space. Overlying paravertebral soft tissue thickening. Findings are consistent with worsened discitis osteomyelitis but in general better assessed by same-day MR. 4. Unchanged enlarged right hilar and subcarinal lymph nodes, likely reactive. 5. Cardiomegaly and coronary artery disease.  MR Thoracic Spine: Result Date: 08/06/2021 1. Redemonstrated abnormal bone marrow signal and enhancement involving the T4 and T5 levels with increased surrounding paraspinal phlegmon and new epidural phlegmon (6 mm thick ventrally). Interval T5 pathologic fracture (50-60% height loss) with new 4 mm of bony retropulsion. Constellation of findings results in new moderate to severe left eccentric canal stenosis at T4 with cord deformity. 2. Overall, findings are most likely secondary to progressive discitis/osteomyelitis. Superimposed osseous metastatic disease or multiple myeloma is not excluded by imaging. Recommend correlation with the results of reported recent biopsy. 3. Multiple small (subcentimeter) fluid collections in the left posterior paraspinal phlegmon could represent tiny abscesses  DG CHEST PORT 1 VIEW Result Date: 08/10/2021 IMPRESSION: Extensive airspace opacity throughout the left lung field. Comparatively mild opacity within the right lung base. Given the asymmetry, these findings likely reflect multifocal pneumonia. Small left pleural effusion.   EGD: Result Date: 08/10/2021 LA Grade D (one or more mucosal breaks involving at least 75% of esophageal circumference) esophagitis with no bleeding was found in the entire esophagus. Biopsies were taken with a cold forceps for histology. Findings: Hematin (altered blood/coffee-ground-like material) was found in the gastric body. No gross lesions  were noted in the entire examined stomach. Biopsies were taken with a cold forceps for histology. Few non-bleeding superficial duodenal ulcers with no stigmata of bleeding were found in the duodenal bulb. The largest lesion was 15 mm in  largest dimension. Biopsies were taken with a cold forceps for histology. The second portion of the duodenum was normal.  CT HEAD WO CONTRAST (5MM) Result Date: 08/11/2021 IMPRESSION: No acute intracranial process.   DG CHEST PORT 1 VIEW Result Date: 08/11/2021 IMPRESSION: No change from the prior study.      Micro Data:  10/13 - Bcx no growth x5d 10/13 - Ucx no growth 10/18 - resp culture pending   Antimicrobials:  Cefepime 10/13-10/13  Cefazolin 10/13 > Zosyn 10/17-10/17  Vancomycin 10/17-10  Interim History/Subjective:  Patient intubated yesterday.   Resting in bed. He is sedated with ETT in place.   Objective:  Blood pressure 120/67, pulse (!) 102, temperature 99.4 F (37.4 C), temperature source Oral, resp. rate 18, height _0  (1.803 m), weight 87.2 kg, SpO2 96 %.    Vent Mode: PRVC FiO2 (%):  [100 %] 100 % Set Rate:  [18 bmp] 18 bmp Vt Set:  [600 mL] 600 mL PEEP:  [5 cmH20] 5 cmH20 Plateau Pressure:  [12 cmH20-26 cmH20] 16 cmH20   Intake/Output Summary (Last 24 hours) at 08/13/2021 0723 Last data filed at 08/13/2021 0700 Gross per 24 hour  Intake 2443.41 ml  Output 1990 ml  Net 453.41 ml   Filed Weights   08/07/21 0628 08/09/21 0502 08/13/21 0500  Weight: 89.6 kg 89.3 kg 87.2 kg    Examination: General: alert and orients and mild to moderate distress with bipap on HEENT: PERRLA, EOM intact Lungs: Diffuse rales throughout and shallow breath sounds Cardiovascular: Tachycardic with regular rhythm, no murmurs rubs or gallops  Abdomen: distended with decreased bowel sounds, but nontender Extremities: warm and dry, normal pulse, and trace edema Neuro: no focal deficits  GU: n/a  Patient Lines/Drains/Airways Status      Active Line/Drains/Airways     Name Placement date Placement time Site Days   Peripheral IV 08/07/21 18 G Left Hand 08/07/21  1534  Hand  4   PICC Single Lumen 07/31/21 Right Brachial 40 cm 0 cm 07/31/21  1034  Brachial  11   Incision (Closed) 08/07/21 Back 08/07/21  1803  -- 4   Pressure Injury 08/10/21 Coccyx Medial Stage 2 -  Partial thickness loss of dermis presenting as a shallow open injury with a red, pink wound bed without slough. 08/10/21  1700  -- 1   Wound / Incision (Open or Dehisced) 07/22/21 Diabetic ulcer Toe (Comment  which one) Left Big toe ulcer 07/22/21  --  Toe (Comment  which one)  20            Resolved Hospital Problem list     Assessment & Plan:   Acute hypoxic respiratory failure, Multifactorial Likely aspiration pneumonia versus pneumonitis  Acute MSK Pain S/p decompression of T4-5 osteodiscitis with extensive pain Hx of COVID-19 infection, 1 month ago Moderate Bilateral Pleural Effusions and Atelectasis - Intubated, VAP/PADs - Titrate FiO2/Peep to keep SpO2 >92% - Will start scheduled oral oxycodone today - Resp culture/BCx pending - Continue Unasyn per ID - Consider thoracentesis for persistent respiratory failure. - Repeat ABG and titrate O2  Esophagitis with hematemesis Duodenal ulcer:  Patient s/p 1 unit PRBC yesterday with post transfusion hgb 7.5. -On SCDS - Trend CBC - On PPI 40 mg BID - trend Hgb - May still need iron transfusion.  Status post thoracic laminectomy Patient is status post T4-T5 laminectomy. Neurosurgery is consulted. He continues to have pain.  - Pain management with q1 hour fentanyl and lidocaine patch - Baclofen  and valium for spasms   Osteomyelitis/MSSA bacteremia Patient is on day 5 of antibiotic therapy with cefazolin and recently broadened to vancomycin and Zosyn.  Infectious disease is following. -  Unasyn Per ID  Diabetes mellitus with hyperglycemia  Patient continues to have elevated CBGs in the mid to  high 200's on SSI and levemir 15 units BID  - Will increase correction factor of SSI  - Basal  - Goal CBG of 140-180 - CBG monitoring    Anxiety disorder this may be exacerbating his pain  - Continue trazodone 100 mg qhs    Best Practice:  Diet: tubefeeds Pain/Anxiety/Delirium protocol (if indicated): yes, fentanyl and oxy VAP protocol (if indicated): yes DVT prophylaxis: SCD GI prophylaxis: PPI Glucose control: SSI Yes and Basal insulin Yes Lines: N/A Foley:  N/A Mobility: bed rest, Code Status: full code Family Communication: pending Disposition: ICU  Labs   CBC: Recent Labs  Lab 08/06/21 0937 08/07/21 0447 08/10/21 0341 08/10/21 1510 08/11/21 0308 08/11/21 1350 08/12/21 0233 08/12/21 1009 08/13/21 0415  WBC 13.1*   < > 23.2*  --  24.9* 30.5* 37.2*  --  35.2*  NEUTROABS 10.0*  --   --   --   --   --  35.0*  --  30.2*  HGB 8.4*   < > 6.2*   < > 6.6* 7.3* 7.5* 14.6 7.1*  HCT 26.1*   < > 19.6*   < > 20.3* 22.6* 23.2* 43.0 21.8*  MCV 85.9   < > 87.9  --  87.1 86.9 87.9  --  89.7  PLT 518*   < > 522*  --  464* 439* 452*  --  412*   < > = values in this interval not displayed.    Basic Metabolic Panel: Recent Labs  Lab 08/09/21 0311 08/10/21 0341 08/11/21 0308 08/12/21 0233 08/12/21 1009 08/12/21 1735 08/13/21 0415  NA 134* 135 133* 136 139  --  140  K 4.7 4.1 3.6 3.4* 3.7  --  3.9  CL 100 103 103 104  --   --  109  CO2 20* 21* 22 23  --   --  22  GLUCOSE 247* 286* 231* 80  --   --  208*  BUN 35* 58* 60* 46*  --   --  55*  CREATININE 2.48* 2.34* 1.92* 1.85*  --   --  1.89*  CALCIUM 8.5* 8.3* 8.3* 8.5*  --   --  8.4*  MG 2.0 2.0 1.9 2.2  --  2.3 2.2  PHOS 4.6 3.2 3.2  --   --  5.0* 3.8   GFR: Estimated Creatinine Clearance: 51.5 mL/min (A) (by C-G formula based on SCr of 1.89 mg/dL (H)). Recent Labs  Lab 08/06/21 0937 08/06/21 1828 08/07/21 0447 08/11/21 0308 08/11/21 1350 08/12/21 0233 08/13/21 0415  PROCALCITON  --   --   --  1.48  --  1.85  3.29  WBC 13.1*  --    < > 24.9* 30.5* 37.2* 35.2*  LATICACIDVEN 0.6 0.5  --   --   --   --   --    < > = values in this interval not displayed.    Liver Function Tests: Recent Labs  Lab 08/06/21 0937 08/07/21 0447 08/09/21 0311 08/12/21 0233  AST 17 13* 13* 60*  ALT <5 <_0 ALKPHOS 163* 145* 122 275*  BILITOT 0.5 0.6 0.9 0.9  PROT 7.0 6.1* 6.1* 5.9*  ALBUMIN 2.0* 1.7* 1.7* <  1.5*   Recent Labs  Lab 08/06/21 0937  LIPASE 21   No results for input(s): AMMONIA in the last 168 hours.  ABG    Component Value Date/Time   PHART 7.430 08/12/2021 1009   PCO2ART 33.1 08/12/2021 1009   PO2ART 62 (L) 08/12/2021 1009   HCO3 22.0 08/12/2021 1009   TCO2 23 08/12/2021 1009   ACIDBASEDEF 2.0 08/12/2021 1009   O2SAT 92.0 08/12/2021 1009     Coagulation Profile: No results for input(s): INR, PROTIME in the last 168 hours.  Cardiac Enzymes: No results for input(s): CKTOTAL, CKMB, CKMBINDEX, TROPONINI in the last 168 hours.  HbA1C: Hemoglobin A1C  Date/Time Value Ref Range Status  06/11/2021 07:49 AM 7.5 (A) 4.0 - 5.6 % Final  03/10/2021 09:39 AM 7.0 (A) 4.0 - 5.6 % Final   Hgb A1c MFr Bld  Date/Time Value Ref Range Status  05/19/2021 04:31 AM 7.7 (H) 4.8 - 5.6 % Final    Comment:    (NOTE)         Prediabetes: 5.7 - 6.4         Diabetes: >6.4         Glycemic control for adults with diabetes: <7.0   11/22/2019 10:12 AM 7.2 (H) 4.8 - 5.6 % Final    Comment:             Prediabetes: 5.7 - 6.4          Diabetes: >6.4          Glycemic control for adults with diabetes: <7.0     CBG: Recent Labs  Lab 08/12/21 1238 08/12/21 1553 08/12/21 1942 08/12/21 2332 08/13/21 0336  GLUCAP 145* 189* 235* 217* 218*    Review of Systems:   See above  Past Medical History  He,  has a past medical history of GAD (generalized anxiety disorder), Hyperlipidemia, Macular degeneration, bilateral, Retinopathy, and Type II diabetes mellitus with complication, uncontrolled.    Surgical History    Past Surgical History:  Procedure Laterality Date   APPENDECTOMY     BIOPSY  08/10/2021   Procedure: BIOPSY;  Surgeon: Otis Brace, MD;  Location: [facility];  Service: Gastroenterology;;   BUBBLE STUDY  07/29/2021   Procedure: BUBBLE STUDY;  Surgeon: Jerline Pain, MD;  Location: [facility];  Service: Cardiovascular;;   ESOPHAGOGASTRODUODENOSCOPY (EGD) WITH PROPOFOL N/A 08/10/2021   Procedure: ESOPHAGOGASTRODUODENOSCOPY (EGD) WITH PROPOFOL;  Surgeon: Otis Brace, MD;  Location: [facility];  Service: Gastroenterology;  Laterality: N/A;   HERNIA REPAIR     IR FLUORO GUIDED NEEDLE PLC ASPIRATION/INJECTION LOC  07/28/2021   LUMBAR LAMINECTOMY/DECOMPRESSION MICRODISCECTOMY N/A 08/07/2021   Procedure: THORACIC FOUR - THORACIC FIVE LAMINECTOMY/DECOMPRESSION OF SPINAL CORD, DEBRIDEMENT OF ABSCESS, MICRODISCECTOMY, INTRAOPERATIVE ULTRASOUND;  Surgeon: Consuella Lose, MD;  Location: [facility];  Service: Neurosurgery;  Laterality: N/A;   TEE WITHOUT CARDIOVERSION N/A 07/29/2021   Procedure: TRANSESOPHAGEAL ECHOCARDIOGRAM (TEE);  Surgeon: Jerline Pain, MD;  Location: [facility];  Service: Cardiovascular;  Laterality: N/A;   TRIGGER FINGER RELEASE Right 10/25/2019   Procedure: RIGHT INDEX FINGER RELEASE TRIGGER FINGER/A-1 PULLEY;  Surgeon: Daryll Brod, MD;  Location: [facility];  Service: Orthopedics;  Laterality: Right;  IV REGIONAL FOREARM BLOCK     Social History   reports that he quit smoking about 12 months ago. His smoking use included cigarettes. He has a 20.00 pack-year smoking history. He has never used smokeless tobacco. He reports that he does not currently use alcohol. He reports that  he does not use drugs.   Family History   His family history includes ADD / ADHD in his brother and son; Diabetes in his father and mother; Hyperlipidemia in his brother and mother; Stroke in his brother and mother.   Allergies Allergies  Allergen  Reactions   Cranberry Itching     Home Medications  Prior to Admission medications   Medication Sig Start Date End Date Taking? Authorizing Provider  acetaminophen (TYLENOL) 500 MG tablet Take 500 mg by mouth every 6 (six) hours as needed for mild pain, fever or headache.   Yes [provider]  albuterol (VENTOLIN HFA) 108 (90 Base) MCG/ACT inhaler Inhale 1 puff into the lungs every 4 (four) hours as needed for wheezing or shortness of breath. 05/27/21  Yes [provider]  ALPRAZolam (XANAX) 0.25 MG tablet Take 0.25 mg by mouth 2 (two) times daily as needed for anxiety. 06/12/21  Yes [provider]  amLODipine (NORVASC) 10 MG tablet Take 1 tablet (10 mg total) by mouth daily. 08/03/21  Yes Cristal Deer, MD  Ascorbic Acid (VITAMIN C PO) Take 1 tablet by mouth daily.   Yes [provider]  atorvastatin (LIPITOR) 40 MG tablet Take 1 tablet (40 mg total) by mouth daily. 03/12/19  Yes Jacelyn Pi, Lilia Argue, MD  ceFAZolin (ANCEF) IVPB Inject 2 g into the vein every 8 (eight) hours. Indication:  Osteomyelitis  First Dose: No Last Day of Therapy:  09/04/2022  Labs - Once weekly:  CBC/D and BMP, Labs - Every other week:  ESR and CRP Method of administration: IV Push Method of administration may be changed at the discretion of home infusion pharmacist based upon assessment of the patient and/or caregiver's ability to self-administer the medication ordered. 07/31/21  Yes Cristal Deer, MD  chlorproMAZINE (THORAZINE) 25 MG tablet Take 1 tablet (25 mg total) by mouth 3 (three) times daily as needed for hiccoughs. 08/03/21  Yes Cristal Deer, MD  collagenase (SANTYL) ointment Apply topically daily. 08/03/21  Yes Cristal Deer, MD  cyclobenzaprine (FLEXERIL) 10 MG tablet Take 1 tablet (10 mg total) by mouth 3 (three) times daily. 08/03/21  Yes Cristal Deer, MD  Dulaglutide (TRULICITY) 3 UR/4.2HC SOPN Inject 3 mg as directed once a week. Patient taking  differently: Inject 3 mg as directed once a week. thursday 06/11/21  Yes Renato Shin, MD  feeding supplement, GLUCERNA SHAKE, (GLUCERNA SHAKE) LIQD Take 237 mLs by mouth 3 (three) times daily between meals. 08/03/21  Yes Cristal Deer, MD  gabapentin (NEURONTIN) 300 MG capsule TAKE 1 TO 2 CAPSULES(300 TO 600 MG) BY MOUTH AT BEDTIME Patient taking differently: Take 300 mg by mouth 2 (two) times daily. 12/01/19  Yes Jacelyn Pi, Lilia Argue, MD  glimepiride (AMARYL) 1 MG tablet Take 1 tablet (1 mg total) by mouth daily with breakfast. 01/08/21  Yes Renato Shin, MD  hydrALAZINE (APRESOLINE) 50 MG tablet Take 1 tablet (50 mg total) by mouth 3 (three) times daily. 08/03/21  Yes Cristal Deer, MD  lactulose (CHRONULAC) 10 GM/15ML solution Take 30 mLs (20 g total) by mouth 2 (two) times daily. 08/03/21  Yes Cristal Deer, MD  Multiple Vitamin (MULTIVITAMIN ADULT PO) Take 1 tablet by mouth daily.   Yes [provider]  mupirocin ointment (BACTROBAN) 2 % Apply 1 application topically 2 (two) times daily. 08/03/21  Yes Cristal Deer, MD  Oxycodone HCl 10 MG TABS Take 10 mg by mouth every 3 (three) hours as needed for pain. 08/03/21  Yes [provider]  senna-docusate (SENOKOT-S) 8.6-50 MG tablet Take 1 tablet by mouth 2 (two) times daily. 08/03/21  Yes Cristal Deer, MD  tadalafil (CIALIS) 20 MG tablet TK 1 T PO  PO QD PRN FOR ERECTILE DYSFUNCTION Patient taking differently: Take 20 mg by mouth daily as needed for erectile dysfunction. 07/20/19  Yes Jacelyn Pi, Irma M, MD  tobramycin (TOBREX) 0.3 % ophthalmic solution Place 1 drop into both eyes See admin instructions. Begin 1 day prior to treatment and continue the day of treatment and for one full day after treatment. 07/05/18  Yes [provider]  traZODone (DESYREL) 100 MG tablet TAKE 1 TABLET(100 MG) BY MOUTH AT BEDTIME AS NEEDED FOR SLEEP Patient taking differently: Take 100 mg by mouth at bedtime. 03/05/21  Yes  Ravi, Himabindu, MD  VITAMIN D PO Take 1 tablet by mouth daily.   Yes [provider]  DULoxetine (CYMBALTA) 60 MG capsule TAKE 1 CAPSULE(60 MG) BY MOUTH DAILY Patient not taking: No sig reported 03/05/21   Einar Grad, Himabindu, MD  Ranibizumab (LUCENTIS) 0.3 MG/0.05ML SOLN 1 Dose by Intravitreal route every 3 (three) months.    [provider]     Critical care time: N/A    [name], D.O.  Internal Medicine Resident, PGY-3 [facility]  Pager: [phone] 7:23 AM, 08/13/2021

## 2021-08-13 NOTE — Progress Notes (Signed)
1940: patient is completely unresponsive, no gag reflex, pupils are equal and round, but unresponsive, sedation decreased by 50%, eLink notified.  2015: patient is still unresponsive, sedation turned completely off, eLink notified.  2047: patient is still unresponsive to any stimulation, eLink notified.  2110: patient is opening eyes intermittently, following commands, sedation restarted, eLink notified

## 2021-08-13 NOTE — Progress Notes (Signed)
Inpatient Diabetes Program Recommendations  AACE/ADA: New Consensus Statement on Inpatient Glycemic Control (2015)  Target Ranges:  Prepandial:   less than 140 mg/dL      Peak postprandial:   less than 180 mg/dL (1-2 hours)      Critically ill patients:  140 - 180 mg/dL   Lab Results  Component Value Date   GLUCAP 176 (H) 08/13/2021   HGBA1C 7.5 (A) 06/11/2021    Review of Glycemic Control Results for DRACE, GATER (MRN [id number]) as of 08/13/2021 09:55  Ref. Range 08/12/2021 19:42 08/12/2021 23:32 08/13/2021 03:36 08/13/2021 07:41  Glucose-Capillary Latest Ref Range: 70 - 99 mg/dL 235 (H) 217 (H) 218 (H) 176 (H)   Diabetes history: Type 2 DM Outpatient Diabetes medications: Trulicity 3 mg qwk, Amaryl 1 mg QD Current orders for Inpatient glycemic control: Novolog 0-20 units Q4H, Levemir 8 units BID Vital @ 50 ml/hr   Inpatient Diabetes Program Recommendations:     Consider adding Novolog 3 units Q4H (for tube feed coverage, to be stopped of held in the events tube feeds are stopped).  Thanks, Bronson Curb, MSN, RNC-OB Diabetes Coordinator [phone] (8a-5p)

## 2021-08-13 NOTE — Progress Notes (Addendum)
NAME:  Jose [name], MRN:  [id number], DOB:  1976-01-20, LOS: 7 ADMISSION DATE:  08/06/2021, CONSULTATION DATE:  08/10/2021 REFERRING MD:  TRH, CHIEF COMPLAINT:  Hypoxia   Brief History   Jose [name] is a 45 y.o. 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 On 10/17 patient developed coffee-ground emesis, now s/p EGD showing circumferential esophagitis with hemorrhage and non-bleeding duodenal ulcers now on BID PPI. On 10/17, pccm was consulted for acute hypoxia.   Past Medical History   has a past medical history of GAD (generalized anxiety disorder), Hyperlipidemia, Macular degeneration, bilateral, Retinopathy, and Type II diabetes mellitus with complication, uncontrolled. Significant Hospital Events   10/13 -admitted with worsening thoracic osteomyelitis, constipation/abd distention, neurosurgery consulted  10/14 - T4-T5 laminectomy, left T5 pediculotomy, and spinal cord decompression 10/17 - Hgb drop to 6.2, coffee ground emesis s/p EGD with esophagitis and peptic ulcer 10/17 - hypoxia and pccm consult  10/19 intubated  Consults:  Neurosurgery Infection disease PCCM  Procedures:  10/14 - T4-T5 laminectomy, left T5 pediculotomy, and spinal cord decompression 10/17 - EGD 10/19 intubation  Significant Diagnostic Tests:  CT Abd/Pelvis Result Date: 08/06/2021 IMPRESSION: 1. Generally large burden of stool and stool balls throughout the colon. 2. Distended urinary bladder, measuring up to 19.0 cm. Correlate for urinary retention. 3. Anasarca.  CTA: Result Date: 08/06/2021 1. Negative  examination for pulmonary embolism. 2. Moderate bilateral pleural effusions and associated atelectasis or consolidation, new compared to prior examination. 3. There is substantial interval worsening of destructive lytic change about the T4-T5 disc space. Overlying paravertebral soft tissue thickening. Findings are consistent with worsened discitis osteomyelitis but in general better assessed by same-day MR. 4. Unchanged enlarged right hilar and subcarinal lymph nodes, likely reactive. 5. Cardiomegaly and coronary artery disease.  MR Thoracic Spine: Result Date: 08/06/2021 1. Redemonstrated abnormal bone marrow signal and enhancement involving the T4 and T5 levels with increased surrounding paraspinal phlegmon and new epidural phlegmon (6 mm thick ventrally). Interval T5 pathologic fracture (50-60% height loss) with new 4 mm of bony retropulsion. Constellation of findings results in new moderate to severe left eccentric canal stenosis at T4 with cord deformity. 2. Overall, findings are most likely secondary to progressive discitis/osteomyelitis. Superimposed osseous metastatic disease or multiple myeloma is not excluded by imaging. Recommend correlation with the results of reported recent biopsy. 3. Multiple small (subcentimeter) fluid collections in the left posterior paraspinal phlegmon could represent tiny abscesses  DG CHEST PORT 1 VIEW Result Date: 08/10/2021 IMPRESSION: Extensive airspace opacity throughout the left lung field. Comparatively mild opacity within the right lung base. Given the asymmetry, these findings likely reflect multifocal pneumonia. Small left pleural effusion.   EGD: Result Date: 08/10/2021 LA Grade D (one or more mucosal breaks involving at least 75% of esophageal circumference) esophagitis with no bleeding was found in the entire esophagus. Biopsies were taken with a cold forceps for histology. Findings: Hematin (altered blood/coffee-ground-like material) was found  in the gastric body. No gross lesions were noted in the entire examined stomach. Biopsies were taken with a cold forceps for histology. Few non-bleeding superficial duodenal ulcers with no stigmata of bleeding were found in the duodenal bulb. The largest lesion  was 15 mm in largest dimension. Biopsies were taken with a cold forceps for histology. The second portion of the duodenum was normal.  CT HEAD WO CONTRAST (5MM) Result Date: 08/11/2021 IMPRESSION: No acute intracranial process.   DG CHEST PORT 1 VIEW Result Date: 08/11/2021 IMPRESSION: No change from the prior study.      Micro Data:  10/13 - Bcx no growth x5d 10/13 - Ucx no growth 10/18 - resp culture pending   Antimicrobials:  Cefepime 10/13-10/13  Cefazolin 10/13 > Zosyn 10/17-10/17  Vancomycin 10/17-10  Interim History/Subjective:  No events. Resting in bed, heavily sedated.  Objective:  Blood pressure 117/68, pulse 78, temperature 98.3 F (36.8 C), temperature source Oral, resp. rate (!) 22, height 5' 11"  (1.803 m), weight 87.2 kg, SpO2 98 %.    Vent Mode: PRVC FiO2 (%):  [50 %-100 %] 50 % Set Rate:  [18 bmp-22 bmp] 22 bmp Vt Set:  [450 mL-600 mL] 450 mL PEEP:  [5 cmH20-14 cmH20] 14 cmH20 Plateau Pressure:  [16 cmH20-27 cmH20] 17 cmH20   Intake/Output Summary (Last 24 hours) at 08/13/2021 2327 Last data filed at 08/13/2021 2200 Gross per 24 hour  Intake 2328.61 ml  Output 1590 ml  Net 738.61 ml    Filed Weights   08/07/21 0628 08/09/21 0502 08/13/21 0500  Weight: 89.6 kg 89.3 kg 87.2 kg    Examination: Sedated on vent Lung sounds worse on L with rhonci, passive on vent Was moving all 4 ext with sedation wean overnight Driving pressure 16 (25 plat, 10 PEEP), fio2 0.6 Abdomen soft, hypoactive BS Ext without edema Small L effusion on [place]   AM labs pending  Resolved Hospital Problem list     Assessment & Plan:   Acute hypoxic respiratory failure due to splinting, likely aspiration with  severe disease in left lung; intubated 10/19 MSSA osteomyelitis of T4/5- with spinal cord bompression s/p T4/5 laminectomy, left T5 pediculotomy to decompress spinal cord; postop course complicated by severe pain COVID positive with infection over a month ago ABLA from candidal esophagitis with hematemesis, duodenal ulcer DM2 with hyperglycemia- DC dexamethasone Opiate and immobility induced severe constipation  - Vent support, VAP prevention bundle - Unasyn with duration TBD - Fluconazole with duration TBD - Sedation titrated to vent synchrony - 1 unit pRBC given 10/20, f/u AM CBC - Strengthen bowel regimen: relistor given 10/20 overnight - Diagnostic L thora, send for usual labs - May benefit from PCA on extubation  Best Practice:  Diet: tubefeeds Pain/Anxiety/Delirium protocol (if indicated): see orders VAP protocol (if indicated): yes DVT prophylaxis: SCD given ongoing blood loss, will need to rechallenge at some point GI prophylaxis: PPI Glucose control: SSI Yes and Basal insulin Yes Lines: N/A Foley:  N/A Mobility: bed rest, Code Status: full code Family Communication: pending Disposition: ICU  34 min cc time Erskine Emery MD PCCM

## 2021-08-13 NOTE — Progress Notes (Addendum)
I have seen and examined the patient. I have personally reviewed the clinical findings, laboratory findings, microbiological data and imaging studies. The assessment and treatment plan was discussed with the Nurse Practitioner Mauricio Po. I agree with her/his recommendations except following additions/corrections.  Febrile 101. 5 , intubated yesterday afternoon  Currently on fio2 90%, on fentanyl  Hb dropped to 6.8 Cr stable at 1.89  Chest Xray noted with findings suggestive of multifocal PNA( left lung predominantly) No respiratory cultures available   Continue Unasyn as is for now Continue fluconazole for candida esophagitis  Fu blood cultures  Monitor CBC and BMP ? Needs thoracentesis for pleural effusion  Vent management Lelon Perla per PCCM Following    Rosiland Oz, MD Infectious Disease Physician [facility] [address] [place], [place] [address] Phone: [phone]  Fax: [place] [facility]  Date of Admission:  08/06/2021     Total days of antibiotics 21         ASSESSMENT:  Jose [name] is now intubated secondary to respiratory failure. Has been febrile with increased leukocytosis which is likely from pneumonia. Appears to be tolerating Unasyn with no problems. Blood cultures drawn on 08/12/21 remain pending. Continue ventilator management and supportive care per CCM. Will continue current dose of Unasyn.  PLAN:  Continue current dose of Unasyn. Monitor blood cultures for presence of bacteremia.  Ventilatory and supportive care per CCM.  ID will continue to follow.   Principal Problem:   Osteomyelitis of thoracic spine (HCC) Active Problems:   Type 2 diabetes mellitus with diabetic polyneuropathy, without long-term current use of insulin (HCC)   Hyperlipidemia   GAD (generalized anxiety disorder)   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)   Essential  hypertension   CKD (chronic kidney disease) stage 3, GFR 30-59 ml/min (HCC)   Pleural effusion, bilateral    sodium chloride   Intravenous Once   amLODipine  10 mg Per Tube Daily   baclofen  10 mg Per Tube TID   chlorhexidine gluconate (MEDLINE KIT)  15 mL Mouth Rinse BID   Chlorhexidine Gluconate Cloth  6 each Topical Daily   etomidate  10 mg Intravenous Once   feeding supplement (PROSource TF)  45 mL Per Tube TID   fluconazole  400 mg Per Tube Daily   gabapentin  300 mg Per Tube Q12H   insulin aspart  0-20 Units Subcutaneous Q4H   insulin detemir  8 Units Subcutaneous BID   ketamine (KETALAR) injection 62m/mL (IV use)  1 mg/kg Intravenous Once   lactulose  20 g Per Tube BID   lidocaine  2 patch Transdermal Q24H   living well with diabetes book   Does not apply Once   mouth rinse  15 mL Mouth Rinse q12n4p   mouth rinse  15 mL Mouth Rinse 10 times per day   multivitamin with minerals  1 tablet Per Tube Daily   mupirocin ointment  1 application Topical BID   pantoprazole sodium  40 mg Per Tube Daily   polyethylene glycol  17 g Per Tube TID   senna-docusate  1 tablet Per Tube BID   sodium chloride flush  10-40 mL Intracatheter Q12H   sodium chloride flush  3 mL Intravenous Q12H   traZODone  100 mg Per Tube QHS    SUBJECTIVE:  Febrile with temperature of 100.9. Now intubated secondary to to respiratory failure. Wife at beside.   Allergies  Allergen Reactions   Cranberry Itching     Review of Systems: Review of Systems  Unable to perform ROS: Intubated     OBJECTIVE: Vitals:   08/13/21 0900 08/13/21 1000 08/13/21 1103 08/13/21 1122  BP: 116/67 129/72  (!) 97/56  Pulse: 99 (!) 104  97  Resp: 18 17  (!) 22  Temp:   (!) 101.5 F (38.6 C)   TempSrc:   Axillary   SpO2: 96% (!) 88%  96%  Weight:      Height:       Body mass index is 26.81 kg/m.  Physical Exam Constitutional:      General: He is not in acute distress.    Appearance: He is well-developed. He is  ill-appearing.     Interventions: He is sedated and intubated.  Cardiovascular:     Rate and Rhythm: Normal rate and regular rhythm.     Heart sounds: Normal heart sounds.  Pulmonary:     Effort: Pulmonary effort is normal. He is intubated.     Breath sounds: Normal breath sounds.  Skin:    General: Skin is warm and dry.  Neurological:     Mental Status: He is alert and oriented to person, place, and time.  Psychiatric:        Behavior: Behavior normal.        Thought Content: Thought content normal.        Judgment: Judgment normal.    Lab Results Lab Results  Component Value Date   WBC 35.2 (H) 08/13/2021   HGB 6.8 (LL) 08/13/2021   HCT 20.0 (L) 08/13/2021   MCV 89.7 08/13/2021   PLT 412 (H) 08/13/2021    Lab Results  Component Value Date   CREATININE 1.89 (H) 08/13/2021   BUN 55 (H) 08/13/2021   NA 142 08/13/2021   K 4.0 08/13/2021   CL 109 08/13/2021   CO2 22 08/13/2021    Lab Results  Component Value Date   ALT 12 08/12/2021   AST 60 (H) 08/12/2021   ALKPHOS 275 (H) 08/12/2021   BILITOT 0.9 08/12/2021     Microbiology: Recent Results (from the past 240 hour(s))  Culture, blood (routine x 2)     Status: None   Collection Time: 08/06/21  9:10 AM   Specimen: BLOOD  Result Value Ref Range Status   Specimen Description BLOOD SITE NOT SPECIFIED  Final   Special Requests   Final    BOTTLES DRAWN AEROBIC AND ANAEROBIC Blood Culture results may not be optimal due to an inadequate volume of blood received in culture bottles   Culture   Final    NO GROWTH 5 DAYS Performed at [facility], [place] [address], [place], [place] [address]    Report Status 08/11/2021 FINAL  Final  Resp Panel by RT-PCR (Flu A&B, Covid) Nasopharyngeal Swab     Status: Abnormal   Collection Time: 08/06/21  9:36 AM   Specimen: Nasopharyngeal Swab; Nasopharyngeal(NP) swabs in vial transport medium  Result Value Ref Range Status   SARS Coronavirus 2 by RT PCR POSITIVE (A) NEGATIVE Final     Comment: rn sarah b. [id number] fcp (NOTE) SARS-CoV-2 target nucleic acids are DETECTED.  The SARS-CoV-2 RNA is generally detectable in upper respiratory specimens during the acute phase of infection. Positive results are indicative of the presence of the identified virus, but do not rule out bacterial infection or co-infection with other pathogens not detected by the test. Clinical correlation with patient  history and other diagnostic information is necessary to determine patient infection status. The expected result is Negative.  Fact Sheet for Patients: EntrepreneurPulse.com.au  Fact Sheet for Healthcare Providers: IncredibleEmployment.be  This test is not yet approved or cleared by the [place] FDA and  has been authorized for detection and/or diagnosis of SARS-CoV-2 by FDA under an Emergency Use Authorization (EUA).  This EUA will remain in effect (meaning this test can be used) for the duration of  the COVID-19 declaration und er Section 564(b)(1) of the Act, 21 U.S.C. section 360bbb-3(b)(1), unless the authorization is terminated or revoked sooner.     Influenza A by PCR NEGATIVE NEGATIVE Final   Influenza B by PCR NEGATIVE NEGATIVE Final    Comment: (NOTE) The Xpert Xpress SARS-CoV-2/FLU/RSV plus assay is intended as an aid in the diagnosis of influenza from Nasopharyngeal swab specimens and should not be used as a sole basis for treatment. Nasal washings and aspirates are unacceptable for Xpert Xpress SARS-CoV-2/FLU/RSV testing.  Fact Sheet for Patients: EntrepreneurPulse.com.au  Fact Sheet for Healthcare Providers: IncredibleEmployment.be  This test is not yet approved or cleared by the [place] FDA and has been authorized for detection and/or diagnosis of SARS-CoV-2 by FDA under an Emergency Use Authorization (EUA). This EUA will remain in effect (meaning this test can be used)  for the duration of the COVID-19 declaration under Section 564(b)(1) of the Act, 21 U.S.C. section 360bbb-3(b)(1), unless the authorization is terminated or revoked.  Performed at [facility], [place] [address] [place], [place] [address]   Culture, blood (routine x 2)     Status: None   Collection Time: 08/06/21  9:37 AM   Specimen: BLOOD RIGHT HAND  Result Value Ref Range Status   Specimen Description BLOOD RIGHT HAND  Final   Special Requests   Final    BOTTLES DRAWN AEROBIC AND ANAEROBIC Blood Culture adequate volume   Culture   Final    NO GROWTH 5 DAYS Performed at [facility], [place] [address] [place], [place] [address]    Report Status 08/11/2021 FINAL  Final  Urine Culture     Status: None   Collection Time: 08/06/21  1:50 PM   Specimen: Urine, Clean Catch  Result Value Ref Range Status   Specimen Description URINE, CLEAN CATCH  Final   Special Requests NONE  Final   Culture   Final    NO GROWTH Performed at [facility], [place] [address], [place], [place] [address]    Report Status 08/07/2021 FINAL  Final  Surgical pcr screen     Status: None   Collection Time: 08/06/21  7:45 PM   Specimen: Nasal Mucosa; Nasal Swab  Result Value Ref Range Status   MRSA, PCR NEGATIVE NEGATIVE Final   Staphylococcus aureus NEGATIVE NEGATIVE Final    Comment: (NOTE) The Xpert SA Assay (FDA approved for NASAL specimens in patients 40 years of age and older), is one component of a comprehensive surveillance program. It is not intended to diagnose infection nor to guide or monitor treatment. Performed at [facility], [place] [address], [place], [place] [address]   Aerobic/Anaerobic Culture w Gram Stain (surgical/deep wound)     Status: None   Collection Time: 08/07/21  5:04 PM   Specimen: PATH Other; Tissue  Result Value Ref Range Status   Specimen Description ABSCESS  Final   Special Requests EPIDURAL SWABS PT ON ANCEF VANC MAXIPIME  Final   Gram Stain  NO WBC  SEEN NO ORGANISMS SEEN   Final   Culture   Final    RARE STAPHYLOCOCCUS AUREUS NO ANAEROBES ISOLATED Performed at [facility], [place] [address] [place], [place] [address]    Report Status 08/12/2021 FINAL  Final   Organism ID, Bacteria STAPHYLOCOCCUS AUREUS  Final      Susceptibility   Staphylococcus aureus - MIC*    CIPROFLOXACIN <=0.5 SENSITIVE Sensitive     ERYTHROMYCIN RESISTANT Resistant     GENTAMICIN <=0.5 SENSITIVE Sensitive     OXACILLIN <=0.25 SENSITIVE Sensitive     TETRACYCLINE <=1 SENSITIVE Sensitive     VANCOMYCIN 1 SENSITIVE Sensitive     TRIMETH/SULFA <=10 SENSITIVE Sensitive     CLINDAMYCIN RESISTANT Resistant     RIFAMPIN <=0.5 SENSITIVE Sensitive     Inducible Clindamycin POSITIVE Resistant     * RARE STAPHYLOCOCCUS AUREUS  Aerobic/Anaerobic Culture w Gram Stain (surgical/deep wound)     Status: None   Collection Time: 08/07/21  5:05 PM   Specimen: PATH Other; Tissue  Result Value Ref Range Status   Specimen Description TISSUE  Final   Special Requests EPIDURAL PT ON VANC MAXIPIME ANCEF  Final   Gram Stain NO WBC SEEN NO ORGANISMS SEEN   Final   Culture   Final    RARE STAPHYLOCOCCUS AUREUS SUSCEPTIBILITIES PERFORMED ON PREVIOUS CULTURE WITHIN THE LAST 5 DAYS. RESULT CALLED TO, READ BACK BY AND VERIFIED WITH: RN L.ILLERBRUN ON [id number] AT 1300 BY E.PARRISH NO ANAEROBES ISOLATED Performed at [facility], [place] [address], [place], [place] [address]    Report Status 08/12/2021 FINAL  Final     Terri Piedra, NP [facility]  08/13/2021  11:24 AM

## 2021-08-13 NOTE — Progress Notes (Signed)
Son's father at bedside, updated on plan of care and patient status.  [place]

## 2021-08-13 NOTE — Progress Notes (Addendum)
Notified E-link about Chest X-Ray results, awaiting orders. Will continue to monitor closely.   '@0400'$ : orders to advance ETT tube 2 cm. Respiratory therapy at bedside, and advanced ETT tube.

## 2021-08-14 ENCOUNTER — Inpatient Hospital Stay (HOSPITAL_COMMUNITY): Payer: 59

## 2021-08-14 DIAGNOSIS — R0902 Hypoxemia: Secondary | ICD-10-CM

## 2021-08-14 DIAGNOSIS — M4624 Osteomyelitis of vertebra, thoracic region: Secondary | ICD-10-CM | POA: Diagnosis not present

## 2021-08-14 LAB — URINALYSIS, ROUTINE W REFLEX MICROSCOPIC
Bilirubin Urine: NEGATIVE
Glucose, UA: 50 mg/dL — AB
Hgb urine dipstick: NEGATIVE
Ketones, ur: 5 mg/dL — AB
Leukocytes,Ua: NEGATIVE
Nitrite: NEGATIVE
Protein, ur: 100 mg/dL — AB
Specific Gravity, Urine: 1.027 (ref 1.005–1.030)
pH: 5 (ref 5.0–8.0)

## 2021-08-14 LAB — COMPREHENSIVE METABOLIC PANEL
ALT: 137 U/L — ABNORMAL HIGH (ref 0–44)
ALT: 169 U/L — ABNORMAL HIGH (ref 0–44)
AST: 500 U/L — ABNORMAL HIGH (ref 15–41)
AST: 624 U/L — ABNORMAL HIGH (ref 15–41)
Albumin: <1.5 g/dL — ABNORMAL LOW (ref 3.5–5.0)
Albumin: 1.6 g/dL — ABNORMAL LOW (ref 3.5–5.0)
Alkaline Phosphatase: 739 U/L — ABNORMAL HIGH (ref 38–126)
Alkaline Phosphatase: 770 U/L — ABNORMAL HIGH (ref 38–126)
Anion gap: 9 (ref 5–15)
Anion gap: 11 (ref 5–15)
BUN: 79 mg/dL — ABNORMAL HIGH (ref 6–20)
BUN: 85 mg/dL — ABNORMAL HIGH (ref 6–20)
CO2: 22 mmol/L (ref 22–32)
CO2: 21 mmol/L — ABNORMAL LOW (ref 22–32)
Calcium: 8.1 mg/dL — ABNORMAL LOW (ref 8.9–10.3)
Calcium: 8.2 mg/dL — ABNORMAL LOW (ref 8.9–10.3)
Chloride: 108 mmol/L (ref 98–111)
Chloride: 105 mmol/L (ref 98–111)
Creatinine, Ser: 3.52 mg/dL — ABNORMAL HIGH (ref 0.61–1.24)
Creatinine, Ser: 3.78 mg/dL — ABNORMAL HIGH (ref 0.61–1.24)
GFR, Estimated: 21 mL/min — ABNORMAL LOW (ref >60)
GFR, Estimated: 19 mL/min — ABNORMAL LOW (ref >60)
Glucose, Bld: 319 mg/dL — ABNORMAL HIGH (ref 70–99)
Glucose, Bld: 365 mg/dL — ABNORMAL HIGH (ref 70–99)
Potassium: 4.7 mmol/L (ref 3.5–5.1)
Potassium: 4.8 mmol/L (ref 3.5–5.1)
Sodium: 139 mmol/L (ref 135–145)
Sodium: 137 mmol/L (ref 135–145)
Total Bilirubin: 1.8 mg/dL — ABNORMAL HIGH (ref 0.3–1.2)
Total Bilirubin: 1.7 mg/dL — ABNORMAL HIGH (ref 0.3–1.2)
Total Protein: 5.8 g/dL — ABNORMAL LOW (ref 6.5–8.1)
Total Protein: 5.9 g/dL — ABNORMAL LOW (ref 6.5–8.1)

## 2021-08-14 LAB — PHOSPHORUS
Phosphorus: 4.2 mg/dL (ref 2.5–4.6)
Phosphorus: 3.7 mg/dL (ref 2.5–4.6)

## 2021-08-14 LAB — CBC WITH DIFFERENTIAL/PLATELET
Abs Immature Granulocytes: 1.49 10*3/uL — ABNORMAL HIGH (ref 0.00–0.07)
Basophils Absolute: 0.1 10*3/uL (ref 0.0–0.1)
Basophils Relative: 0 %
Eosinophils Absolute: 0.1 10*3/uL (ref 0.0–0.5)
Eosinophils Relative: 0 %
HCT: 25.8 % — ABNORMAL LOW (ref 39.0–52.0)
Hemoglobin: 8.1 g/dL — ABNORMAL LOW (ref 13.0–17.0)
Immature Granulocytes: 6 %
Lymphocytes Relative: 3 %
Lymphs Abs: 0.9 10*3/uL (ref 0.7–4.0)
MCH: 28.4 pg (ref 26.0–34.0)
MCHC: 31.4 g/dL (ref 30.0–36.0)
MCV: 90.5 fL (ref 80.0–100.0)
Monocytes Absolute: 2.5 10*3/uL — ABNORMAL HIGH (ref 0.1–1.0)
Monocytes Relative: 10 %
Neutro Abs: 21.3 10*3/uL — ABNORMAL HIGH (ref 1.7–7.7)
Neutrophils Relative %: 81 %
Platelets: 381 10*3/uL (ref 150–400)
RBC: 2.85 MIL/uL — ABNORMAL LOW (ref 4.22–5.81)
RDW: 14.9 % (ref 11.5–15.5)
WBC: 26.3 10*3/uL — ABNORMAL HIGH (ref 4.0–10.5)
nRBC: 0.1 % (ref 0.0–0.2)

## 2021-08-14 LAB — BODY FLUID CELL COUNT WITH DIFFERENTIAL
Eos, Fluid: 0 %
Lymphs, Fluid: 1 %
Monocyte-Macrophage-Serous Fluid: 3 % — ABNORMAL LOW (ref 50–90)
Neutrophil Count, Fluid: 96 % — ABNORMAL HIGH (ref 0–25)
Total Nucleated Cell Count, Fluid: 3300 cu mm — ABNORMAL HIGH (ref 0–1000)

## 2021-08-14 LAB — GLUCOSE, CAPILLARY
Glucose-Capillary: 344 mg/dL — ABNORMAL HIGH (ref 70–99)
Glucose-Capillary: 274 mg/dL — ABNORMAL HIGH (ref 70–99)
Glucose-Capillary: 299 mg/dL — ABNORMAL HIGH (ref 70–99)
Glucose-Capillary: 305 mg/dL — ABNORMAL HIGH (ref 70–99)
Glucose-Capillary: 334 mg/dL — ABNORMAL HIGH (ref 70–99)
Glucose-Capillary: 301 mg/dL — ABNORMAL HIGH (ref 70–99)

## 2021-08-14 LAB — SODIUM, URINE, RANDOM: Sodium, Ur: 18 mmol/L

## 2021-08-14 LAB — CREATININE, URINE, RANDOM: Creatinine, Urine: 128.91 mg/dL

## 2021-08-14 LAB — TRIGLYCERIDES: Triglycerides: 181 mg/dL — ABNORMAL HIGH (ref <150)

## 2021-08-14 LAB — MAGNESIUM
Magnesium: 2.5 mg/dL — ABNORMAL HIGH (ref 1.7–2.4)
Magnesium: 2.6 mg/dL — ABNORMAL HIGH (ref 1.7–2.4)

## 2021-08-14 LAB — CK: Total CK: 25 U/L — ABNORMAL LOW (ref 49–397)

## 2021-08-14 LAB — LACTATE DEHYDROGENASE, PLEURAL OR PERITONEAL FLUID: LD, Fluid: 202 U/L — ABNORMAL HIGH (ref 3–23)

## 2021-08-14 LAB — PROTEIN, PLEURAL OR PERITONEAL FLUID: Total protein, fluid: <3 g/dL

## 2021-08-14 IMAGING — US US RENAL
1 series · 14 of 25 positions shown · non-contrast
Comparison: CT [DATE]

CLINICAL DATA: Acute kidney injury

EXAM:
RENAL / URINARY TRACT ULTRASOUND COMPLETE

[Series 1: us renal · 14 of 38 slices shown]
[im 1/38]
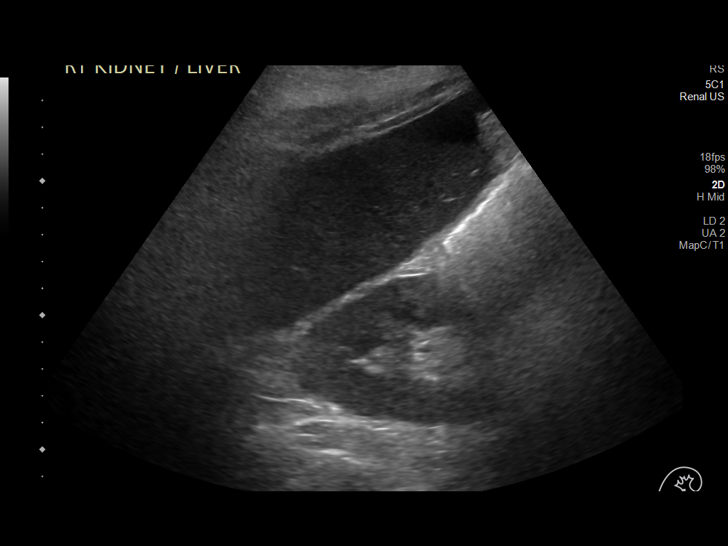
[im 4/38]
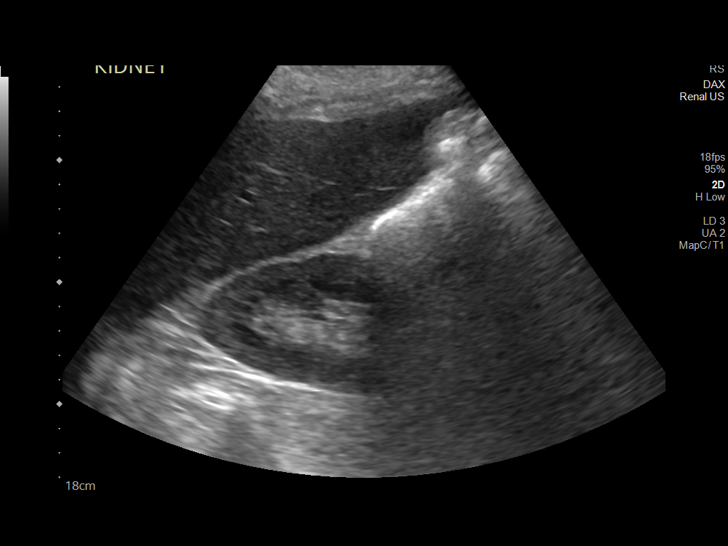
[im 7/38]
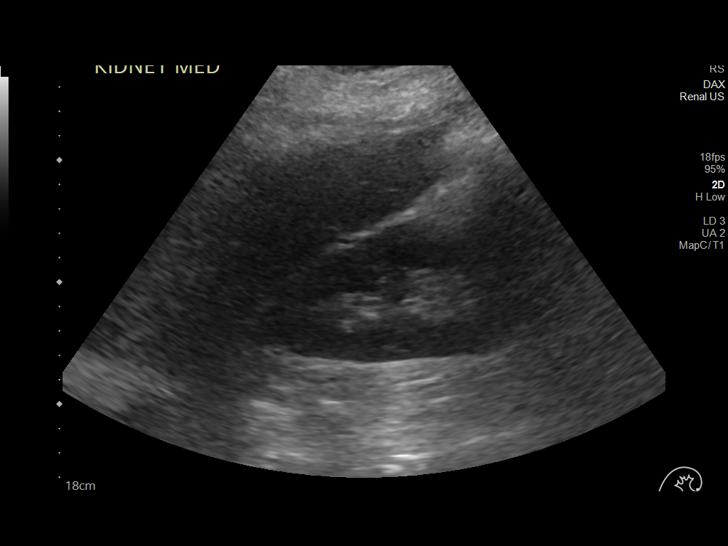
[im 10/38]
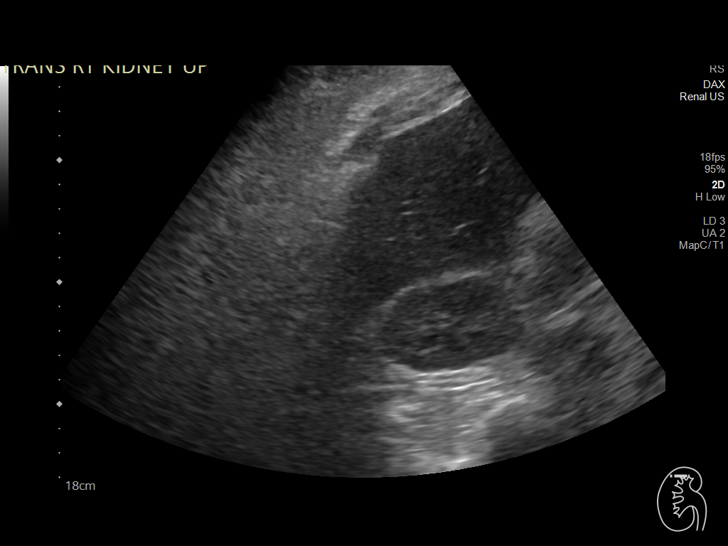
[im 13/38]
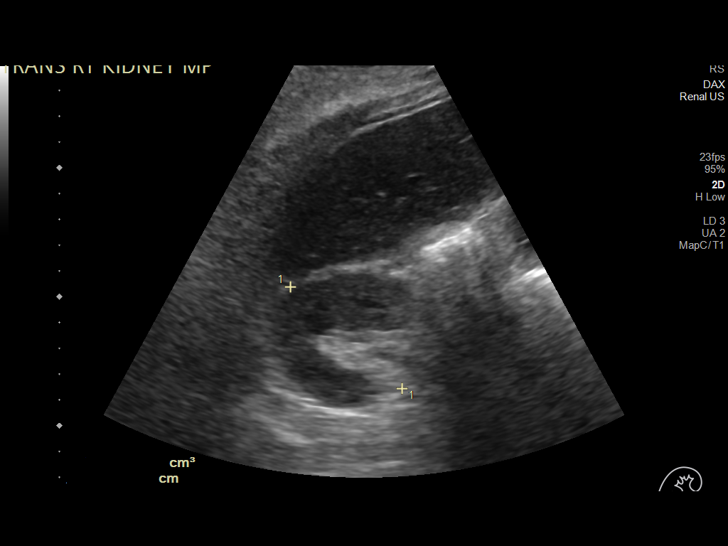
[im 14/38]
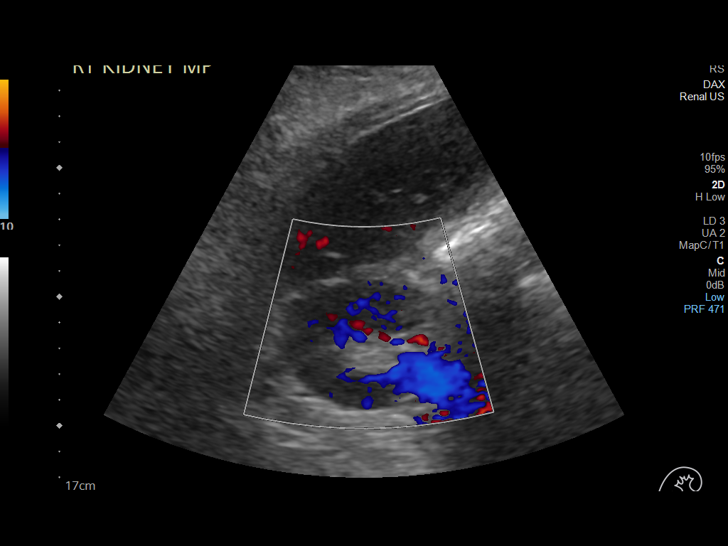
[im 17/38]
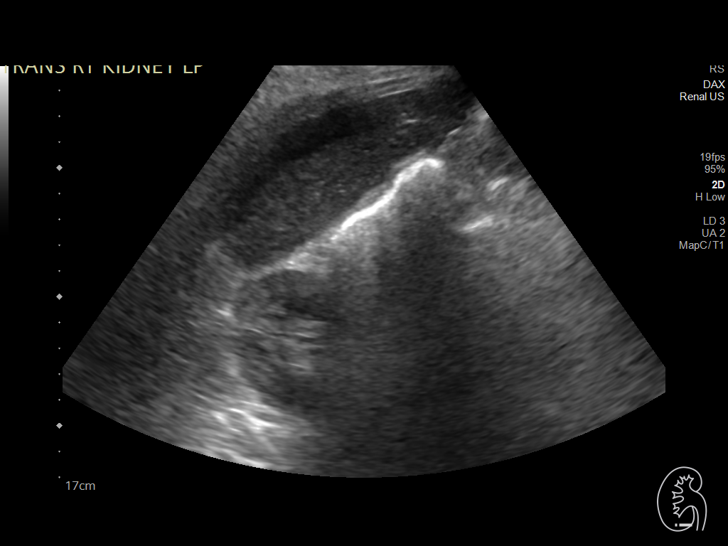
[im 21/38]
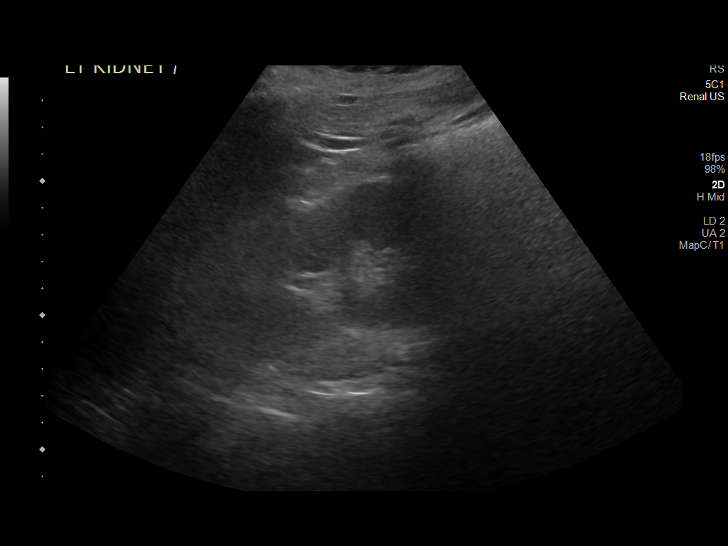
[im 24/38]
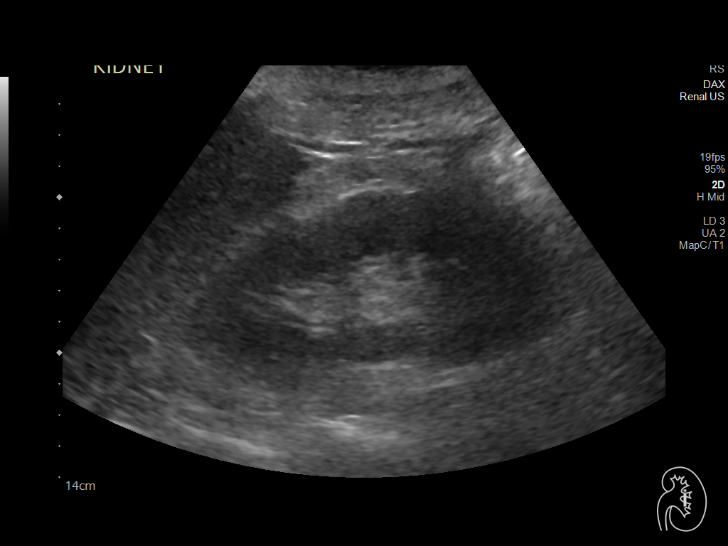
[im 25/38]
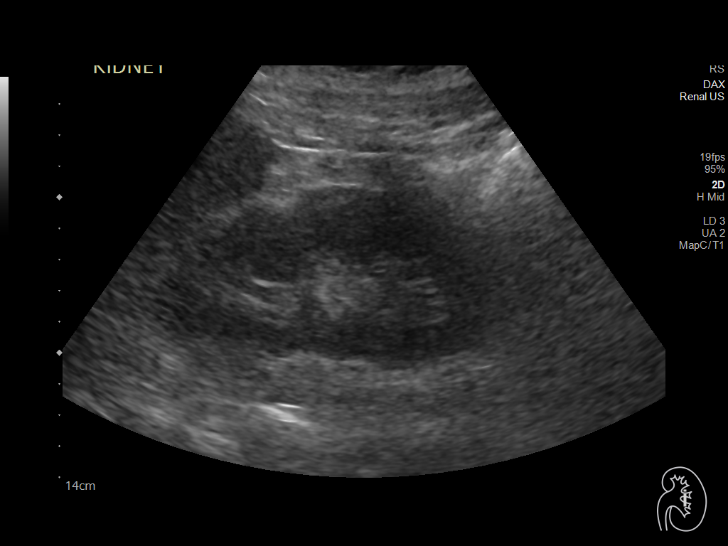
[im 28/38]
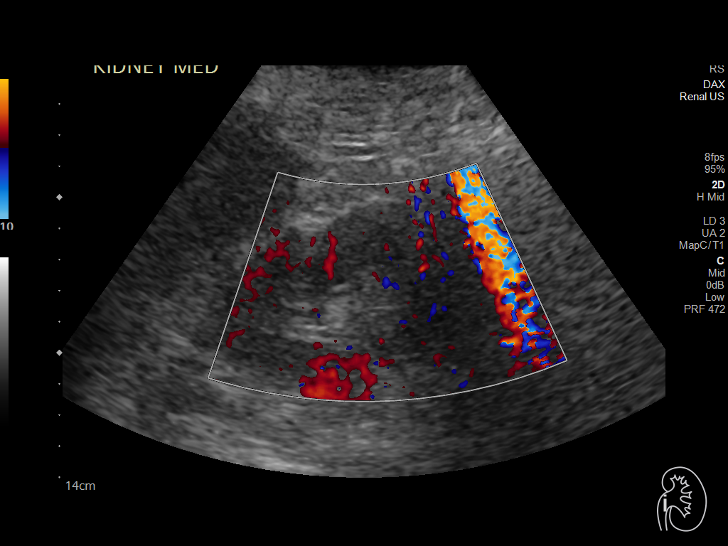
[im 31/38]
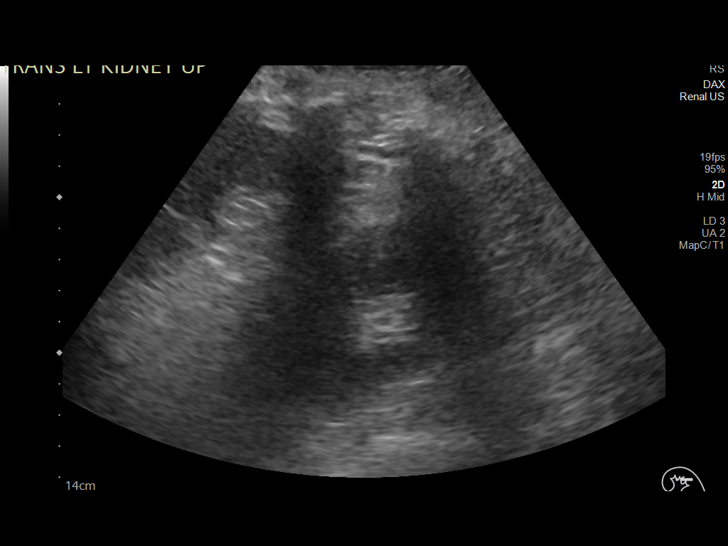
[im 34/38]
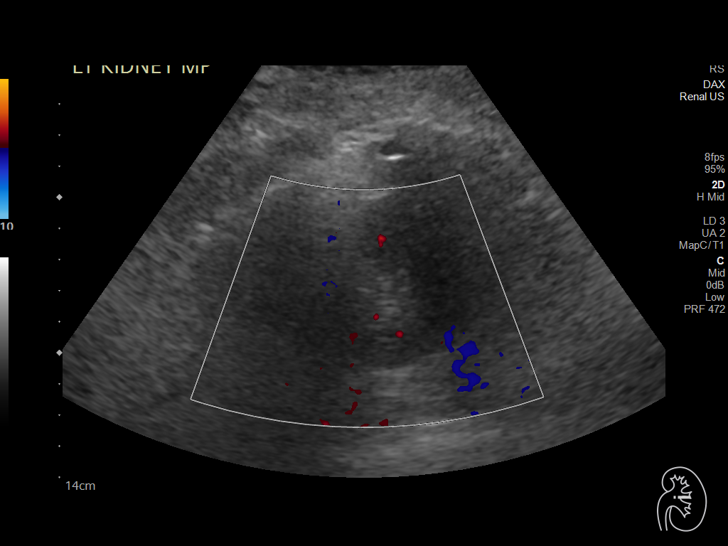
[im 38/38]
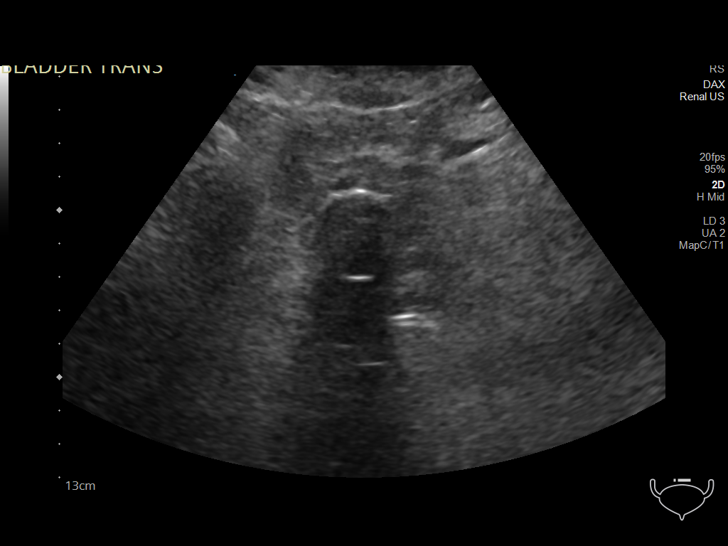

[14 of 25 positions shown; findings below may reference images not displayed]

FINDINGS: Right Kidney:

Renal measurements: 10.7 x 5.8 by 5.9 cm = volume: 189.6 mL.
Echogenicity within normal limits. No mass or hydronephrosis
visualized.

Left Kidney:

Renal measurements: 11.5 by 5.7 x 4.5 cm = volume: 155.8 mL.
Echogenicity within normal limits. No mass or hydronephrosis
visualized.

Bladder:

Decompressed by Foley catheter.

Other:

Incidental note made of trace right pleural effusion and small
amount of right upper quadrant ascites
IMPRESSION: 1. Normal ultrasound appearance of the kidneys
2. Trace right pleural effusion and small amount of right upper
quadrant ascites

## 2021-08-14 IMAGING — DX DG CHEST 1V PORT
1 series · 1 of 1 positions shown · non-contrast
Comparison: [DATE].

CLINICAL DATA: CAP (community acquired pneumonia) [TL] ([TL]-CM)

EXAM:
PORTABLE CHEST 1 VIEW

[chest]
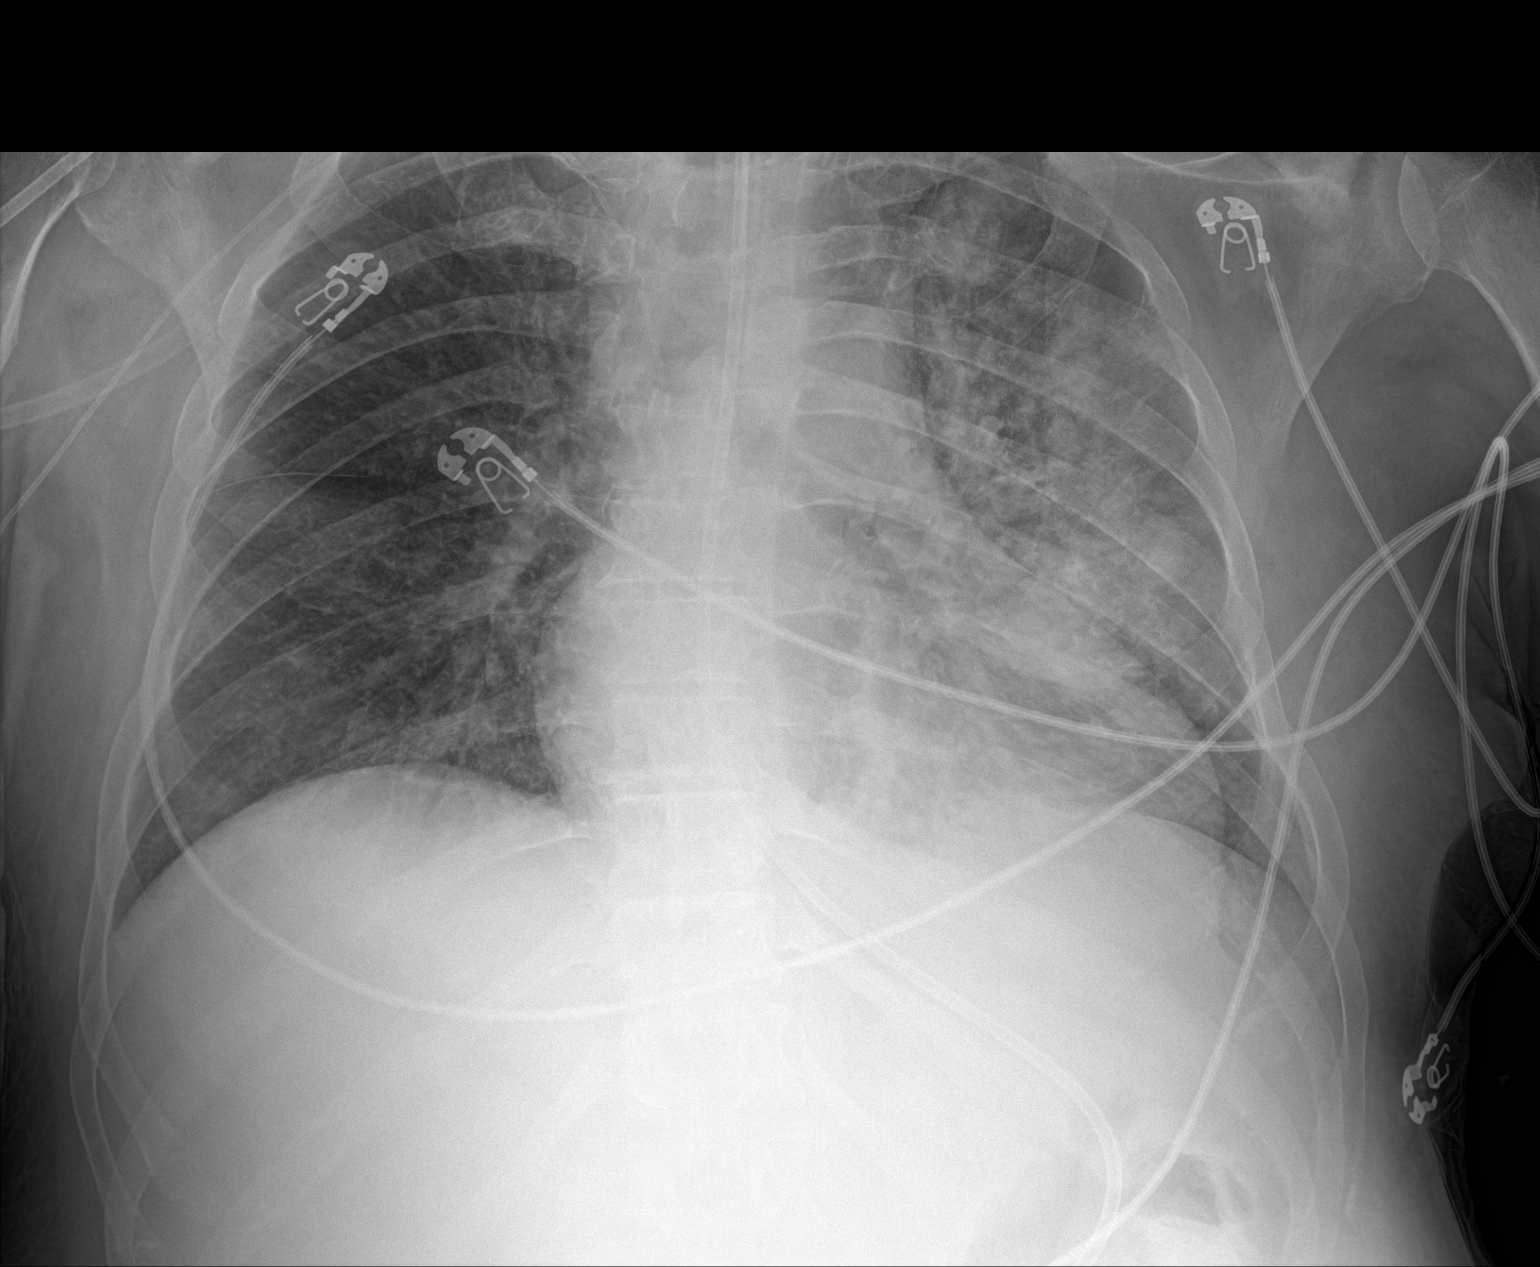

[1 of 1 positions shown; findings below may reference images not displayed]

FINDINGS: Slightly improved extensive patchy airspace opacities in the left
lung. Right lung remains clear. Endotracheal tube tip is immediately
inferior to the level of the clavicular heads, unchanged. Enteric
tube courses below the diaphragm with the tip outside the field of
view. No visible pleural effusions or pneumothorax on this semi
erect AP radiograph. No acute osseous abnormality.
IMPRESSION: Slightly improved extensive patchy airspace opacities in the left
lung, compatible with pneumonia.

## 2021-08-14 IMAGING — DX DG ABD PORTABLE 1V
1 series · 3 of 3 positions shown · non-contrast
Comparison: [DATE]

CLINICAL DATA: Acute kidney injury

EXAM:
PORTABLE ABDOMEN - 1 VIEW

[Series 1: abdomen · 0.14mm/px · 3 of 3 slices shown]
[im 1/3]
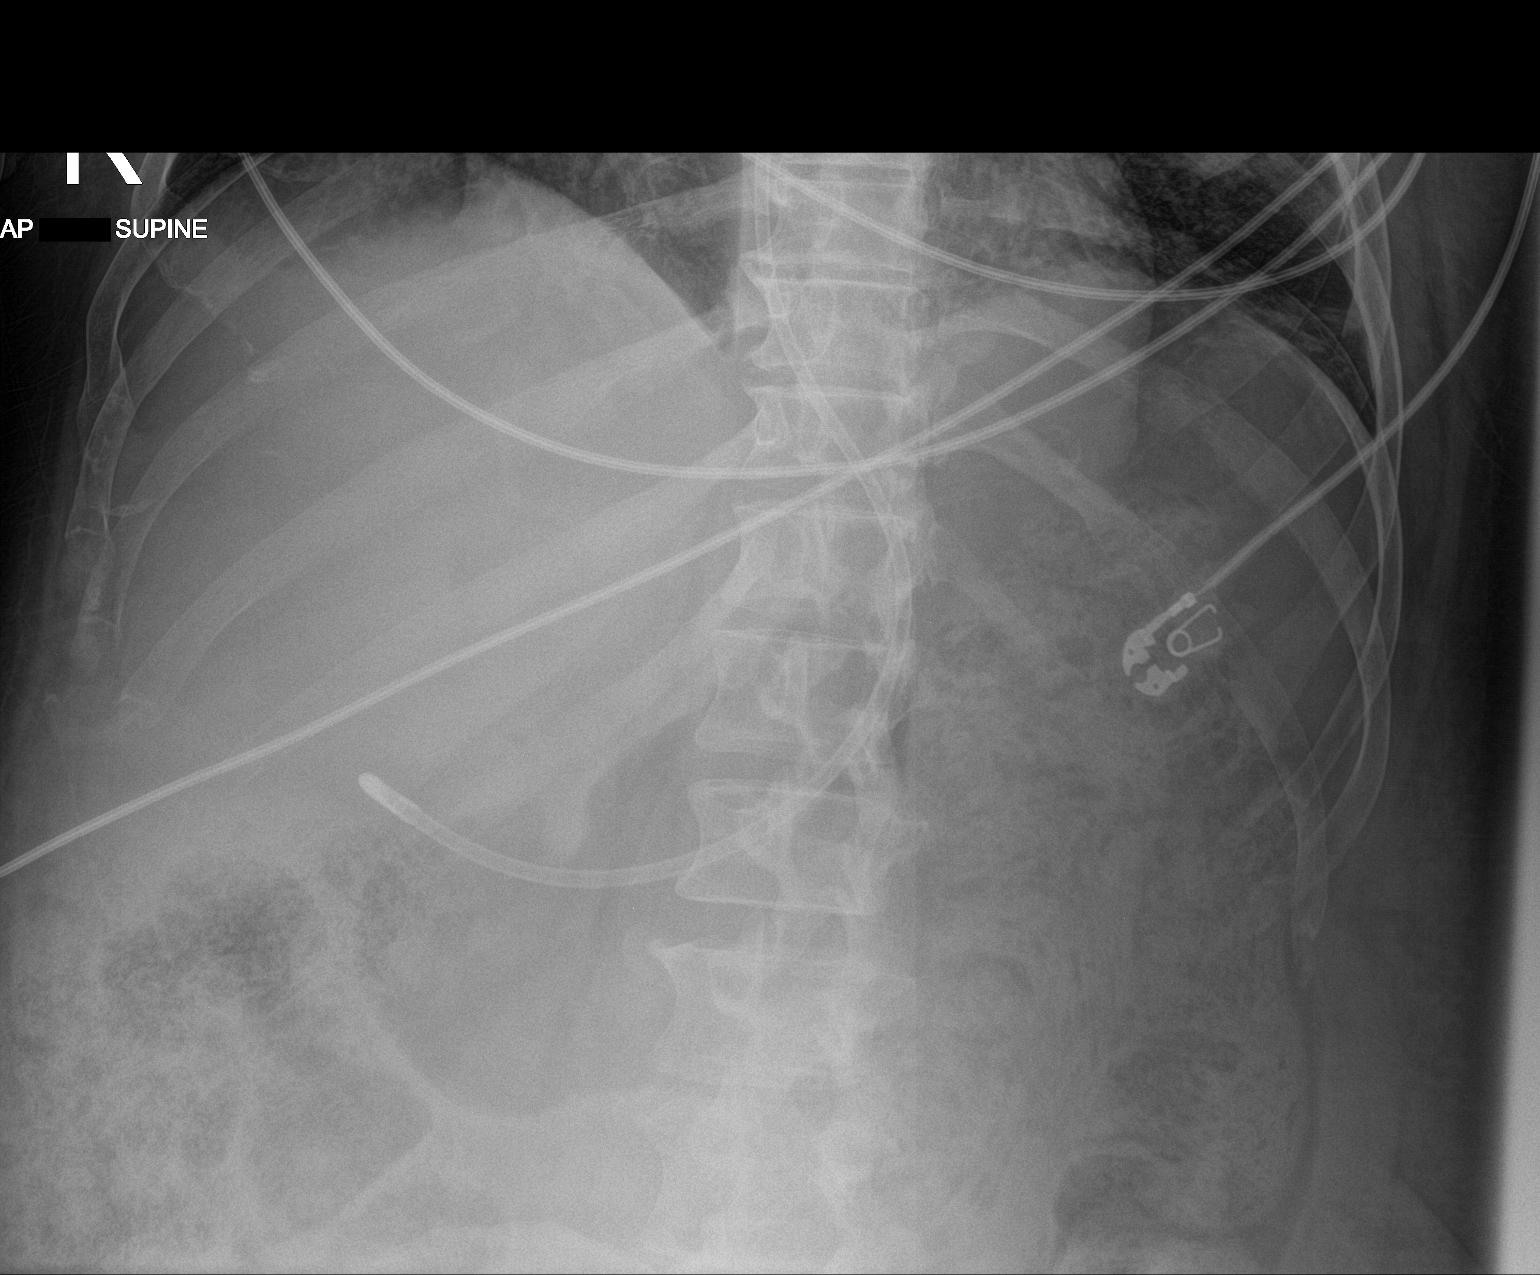
[im 2/3]
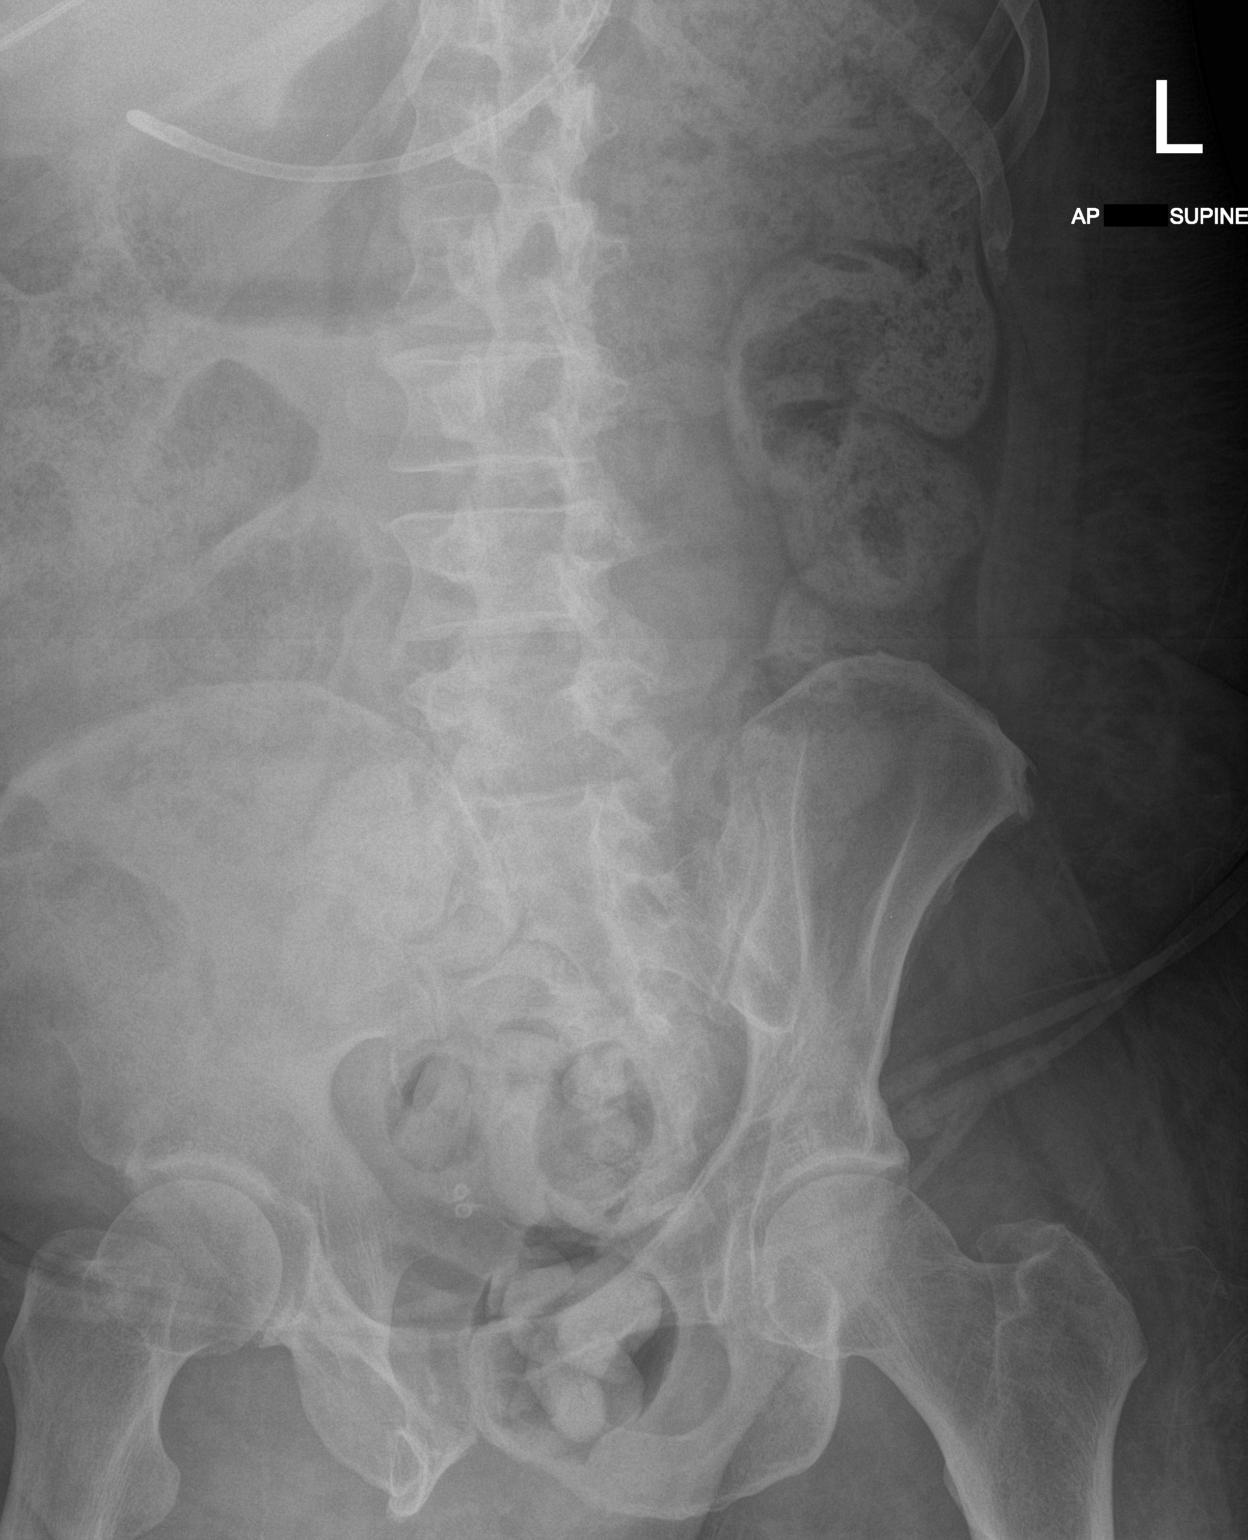
[im 3/3]
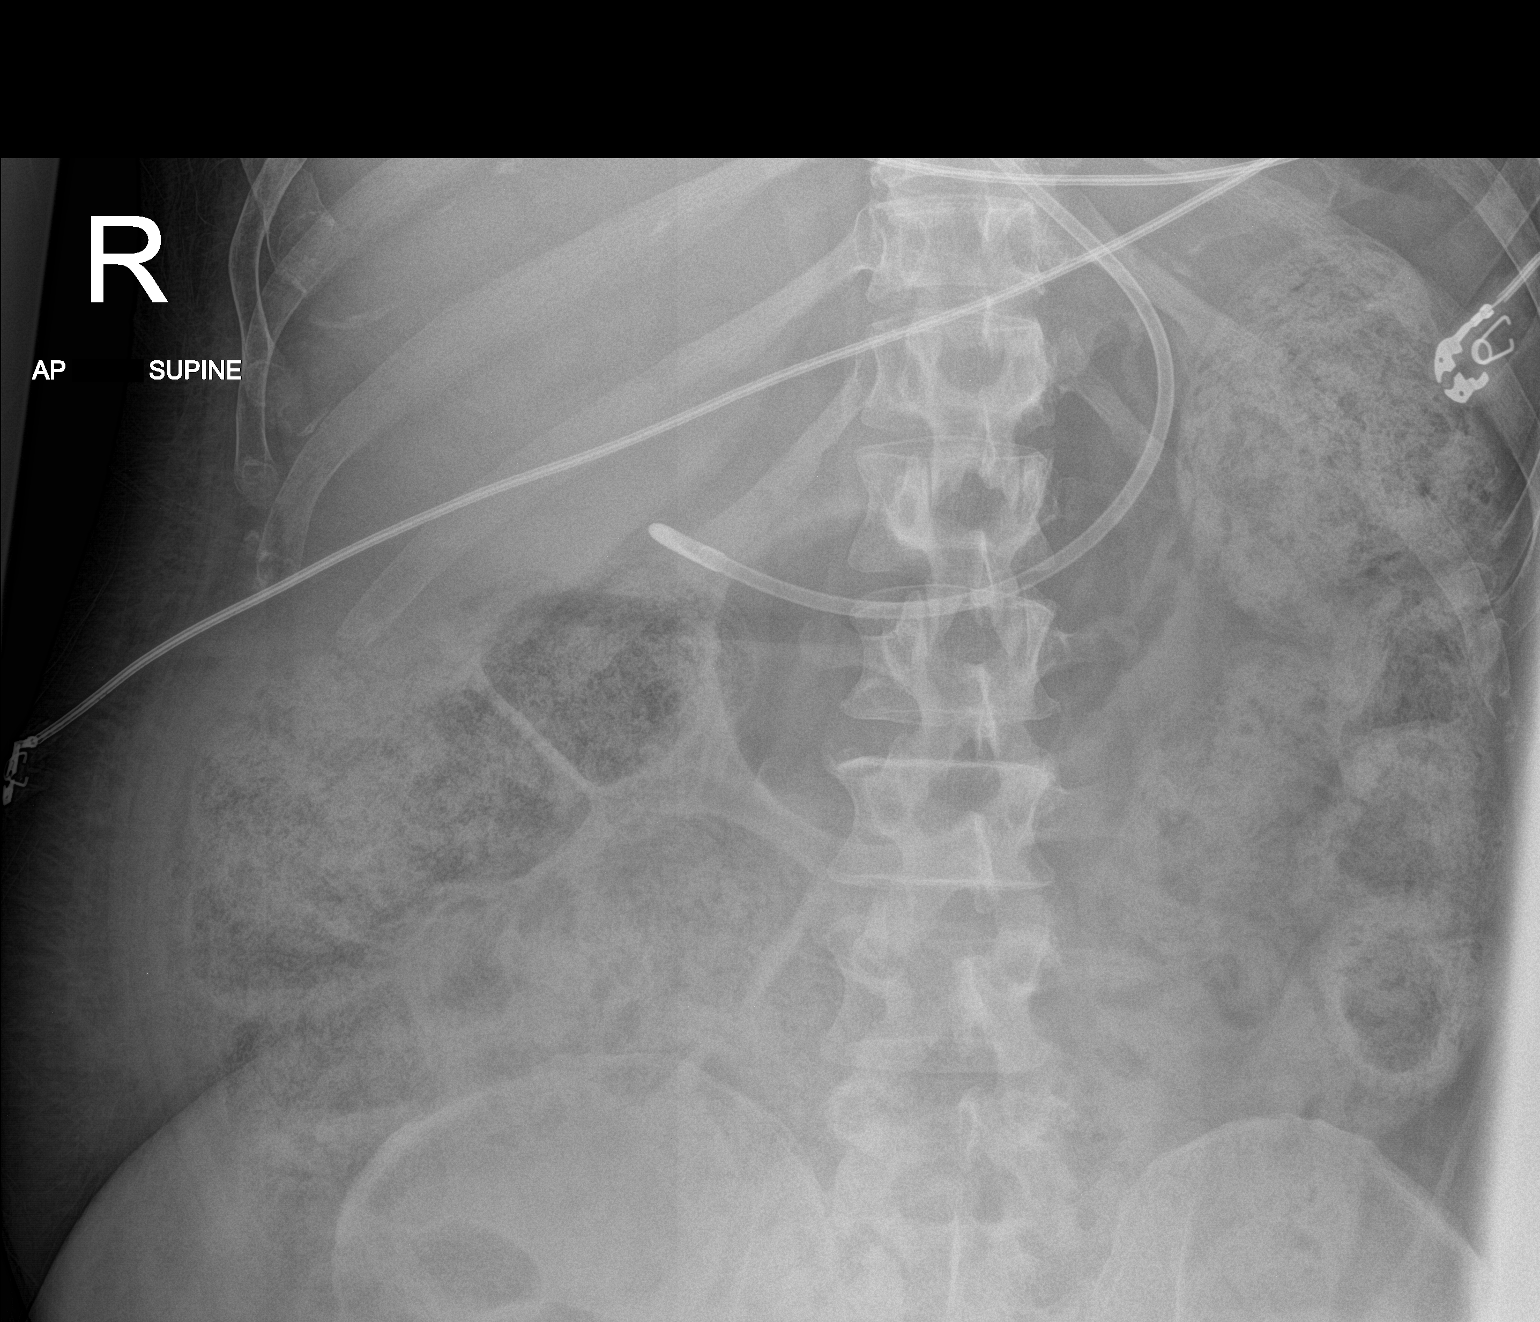

[3 of 3 positions shown; findings below may reference images not displayed]

FINDINGS: Esophageal tube tip overlies the distal stomach. Nonobstructed gas
pattern with large quantity of stool throughout the colon.
IMPRESSION: 1. Esophageal tube tip overlies distal stomach
2. Nonobstructed gas pattern. Large quantity of stool in the colon,
possible constipation

## 2021-08-14 MED ORDER — FUROSEMIDE 10 MG/ML IJ SOLN
40.0000 mg | Freq: Once | INTRAMUSCULAR | Status: AC
  Administered 2021-08-14: 40 mg via INTRAVENOUS
  Filled 2021-08-14: qty 4

## 2021-08-14 MED ORDER — INSULIN ASPART 100 UNIT/ML IJ SOLN
4.0000 [IU] | INTRAMUSCULAR | Status: DC
  Administered 2021-08-14 – 2021-08-15 (×7): 4 [IU] via SUBCUTANEOUS

## 2021-08-14 MED ORDER — LACTATED RINGERS IV BOLUS
1000.0000 mL | Freq: Once | INTRAVENOUS | Status: AC
  Administered 2021-08-14: 1000 mL via INTRAVENOUS

## 2021-08-14 MED ORDER — LACTULOSE 10 GM/15ML PO SOLN
20.0000 g | Freq: Three times a day (TID) | ORAL | Status: DC
  Administered 2021-08-14 – 2021-08-16 (×8): 20 g
  Filled 2021-08-14 (×8): qty 30

## 2021-08-14 MED ORDER — ALBUMIN HUMAN 25 % IV SOLN
25.0000 g | Freq: Once | INTRAVENOUS | Status: AC
  Administered 2021-08-14: 25 g via INTRAVENOUS
  Filled 2021-08-14: qty 100

## 2021-08-14 MED ORDER — SUCRALFATE 1 G PO TABS
1.0000 g | ORAL_TABLET | Freq: Three times a day (TID) | ORAL | Status: DC
  Administered 2021-08-14 – 2021-08-15 (×5): 1 g
  Filled 2021-08-14 (×5): qty 1

## 2021-08-14 NOTE — Progress Notes (Signed)
OT Cancellation Note  Patient Details Name: Jose [name] MRN: [id number] DOB: 01-02-1976   Cancelled Treatment:    Reason Eval/Treat Not Completed: Patient not medically ready. Patient remains intubated and sedated.  Continues efforts as appropriate.    Clevester Helzer D Maesyn Frisinger 08/14/2021, 1:11 PM

## 2021-08-14 NOTE — Progress Notes (Signed)
Daily Progress Note   Patient Name: Jose [name]       Date: 08/14/2021 DOB: Feb 07, 1976  Age: 45 y.o. MRN#: [id number] Attending Physician: Candee Furbish, MD Primary Care Physician: [name], NP Admit Date: 08/06/2021  Reason for Consultation/Follow-up: Goals/psychosocial support  Subjective: I saw and examined Jose [name] today and spoke with his wife, Jose [name], at the bedside.  He also had a friend from work, Financial trader, visiting as well.  We discussed his clinical course overnight and the fact that he is now intubated.  Also reviewed conversation she had with critical care team that while he remains critically ill, these are still reversible conditions and we will need to see how he responds to current treatment.  She again expressed appreciation of care Jose [name] has received this admission and she is much more comfortable with a level of communication that has been occurring with this hospitalization.  Length of Stay: 8  Current Medications: Scheduled Meds:  . sodium chloride   Intravenous Once  . amLODipine  10 mg Per Tube Daily  . baclofen  10 mg Per Tube TID  . chlorhexidine gluconate (MEDLINE KIT)  15 mL Mouth Rinse BID  . Chlorhexidine Gluconate Cloth  6 each Topical Daily  . feeding supplement (PROSource TF)  45 mL Per Tube TID  . fluconazole  400 mg Per Tube Daily  . gabapentin  300 mg Per Tube Q12H  . insulin aspart  0-20 Units Subcutaneous Q4H  . insulin aspart  4 Units Subcutaneous Q4H  . insulin detemir  15 Units Subcutaneous BID  . lactulose  20 g Per Tube TID  . lidocaine  2 patch Transdermal Q24H  . living well with diabetes book   Does not apply Once  . mouth rinse  15 mL Mouth Rinse q12n4p  . mouth rinse  15 mL Mouth Rinse 10 times per day  . multivitamin with  minerals  1 tablet Per Tube Daily  . mupirocin ointment  1 application Topical BID  . oxyCODONE  10 mg Per Tube Q6H  . pantoprazole sodium  40 mg Per Tube Daily  . polyethylene glycol  17 g Per Tube TID  . senna-docusate  1 tablet Per Tube BID  . sodium chloride flush  10-40 mL Intracatheter Q12H  . sodium chloride flush  3 mL  Intravenous Q12H  . sucralfate  1 g Per Tube TID WC & HS  . traZODone  100 mg Per Tube QHS    Continuous Infusions: . sodium chloride Stopped (08/13/21 1602)  . ampicillin-sulbactam (UNASYN) IV 3 g (08/14/21 0910)  . feeding supplement (VITAL 1.5 CAL) 1,000 mL (08/14/21 0529)  . fentaNYL infusion INTRAVENOUS 200 mcg/hr (08/14/21 0900)  . promethazine (PHENERGAN) injection (IM or IVPB) 25 mg (08/10/21 0506)  . propofol (DIPRIVAN) infusion 10 mcg/kg/min (08/14/21 0900)    PRN Meds: sodium chloride, acetaminophen **OR** acetaminophen, albuterol, bisacodyl, fentaNYL, hydrALAZINE, metoprolol tartrate, midazolam, promethazine (PHENERGAN) injection (IM or IVPB), sodium chloride flush, sodium chloride flush, sodium phosphate  Physical Exam    General: Intubated and sedated  HEENT: No JVD, ET tube in place Heart: Regular rate and rhythm. No murmur appreciated. Lungs: Rales Abdomen: Soft, nontender, nondistended, positive bowel sounds.   Ext: No significant edema Skin: Warm and dry Neuro: Sedated        Vital Signs: BP 137/78   Pulse 67   Temp 98.8 F (37.1 C) (Axillary)   Resp (!) 22   Ht 5' 11"  (1.803 m)   Wt 89.9 kg   SpO2 98%   BMI 27.64 kg/m  SpO2: SpO2: 98 % O2 Device: O2 Device: Ventilator O2 Flow Rate: O2 Flow Rate (L/min): 15 L/min  Intake/output summary:  Intake/Output Summary (Last 24 hours) at 08/14/2021 1026 Last data filed at 08/14/2021 0900 Gross per 24 hour  Intake 3218.63 ml  Output 550 ml  Net 2668.63 ml   LBM: Last BM Date: 08/10/21 Baseline Weight: Weight: 86.2 kg Most recent weight: Weight: 89.9 kg       Palliative  Assessment/Data:      Patient Active Problem List   Diagnosis Date Noted  . Osteomyelitis of thoracic spine ([place]) 08/06/2021  . Essential hypertension 08/06/2021  . CKD (chronic kidney disease) stage 3, GFR 30-59 ml/min (H[facility]) 08/06/2021  . Pleural effusion, bilateral 08/06/2021  . Diabetic ulcer of left great toe ([place])   . MSSA bacteremia 07/23/2021  . Constipation 07/23/2021  . Lytic bone lesions on xray 07/22/2021  . Anemia 05/19/2021  . Nausea and vomiting 05/19/2021  . Elevated blood pressure reading 05/19/2021  . Bilateral carpal tunnel syndrome 01/23/2020  . Numbness 12/12/2019  . Microalbuminuria due to type 2 diabetes mellitus ([place]) 07/20/2019  . GAD (generalized anxiety disorder) 06/18/2019  . Adult ADHD 06/18/2019  . Trigger middle finger of left hand 06/11/2019  . Diabetic retinopathy ([place]) 07/13/2018  . Type 2 diabetes mellitus with diabetic polyneuropathy, without long-term current use of insulin ([place]) 05/17/2018  . Tobacco use disorder 05/17/2018  . Hyperlipidemia 05/17/2018    Palliative Care Assessment & Plan   Patient Profile: 46 year old male with recent admission for MSSA bacteremia/osteomyelitis now status post T4/T5 laminectomy, T5 pediculectomy, and spinal cord decompression on 1014 who developed GI bleed and hypoxia.  Intubated overnight.  Recommendations/Plan: Full code/full scope Discussed clinical course with his wife Jose [name] at the bedside today.  She reports feeling up-to-date and confident in information she has received.  Discussed plan to continue with current clinical care to see how he responds over the next several days.  Palliative will continue to follow peripherally and check in intermittently for support for Tadarius and his wife.  Please call if there are other palliative specific needs with which our team can be of assistance.  Goals of Care and Additional Recommendations: Limitations on Scope of Treatment: Full Scope Treatment  Code  Status:    Code Status Orders  (From admission, onward)           Start     Ordered   08/06/21 1536  Full code  Continuous        08/06/21 1537           Code Status History     Date Active Date Inactive Code Status Order ID Comments User Context   07/22/2021 1844 08/03/2021 1712 Full Code [id number]  Terrilee Croak, MD Inpatient   05/19/2021 0418 05/21/2021 1847 Full Code [id number]  Tacey Ruiz, MD Inpatient      Advance Directive Documentation    Flowsheet Row Most Recent Value  Type of Advance Directive Healthcare Power of Attorney  Pre-existing out of facility DNR order (yellow form or pink MOST form) --  "MOST" Form in Place? --       Prognosis:  Unable to determine  Discharge Planning: To Be Determined  Care plan was discussed with Dr. Marianna Payment, wife, and bedside RN  Thank you for allowing the Palliative Medicine Team to assist in the care of this patient.   Total Time 20 Prolonged Time Billed No      Greater than 50%  of this time was spent counseling and coordinating care related to the above assessment and plan.  Micheline Rough, MD  Please contact Palliative Medicine Team phone at [phone] for questions and concerns.

## 2021-08-14 NOTE — Progress Notes (Signed)
PT Cancellation Note  Patient Details Name: LUVERN MCISAAC MRN: [id number] DOB: 11-16-1975   Cancelled Treatment:    Reason Eval/Treat Not Completed: Patient not medically ready. Pt remains intubated and sedated. PT to follow up Monday.   Lorriane Shire 08/14/2021, 9:39 AM  Lorrin Goodell, PT  Office # [phone] Pager [phone]

## 2021-08-14 NOTE — Consult Note (Signed)
Jose [name] [id number] Admit Date: 08/06/2021 08/14/2021 Jose [name] Requesting Physician:  Jose Julian MD  Reason for Consult:  AoCKD3 HPI:  64M with recent hospital stay due to MSSA bacteremia and thoracic spine osteomyelitis discharged on long-term cefazolin who we presented on 10/13 with worsened back [name] and found to have pathological fracture of T5, progression of osteomyelitis, and concern for cord compression.  The next day he underwent thoracic decompression with neurosurgery.  Course has been complicated by COVID-positive testing, ventilator dependent acute hypoxic respiratory failure, aspiration pneumonia requiring transition of antibiotics to Unasyn currently, ABLA with EGD findings of candidal esophagitis and duodenal ulcer.   Patient has baseline CKD 3, follows with Dr. Hollie [name] our office.  Likely diagnosis is diabetic kidney disease.  He has microalbuminuria.  Baseline creatinine is 1.5-2.0 which he has been not until this morning when his creatinine increased briskly to 3.5.  He has a Foley catheter recently placed but did not have a large amount of urine at time of insertion.  No identified exposure to IV contrast.  No use of NSAIDs.  Urine analysis today with some pyuria and hematuria, trace proteinuria.  Urine sodium of 18.  Renal ultrasound is pending.  Pyuria was not present on 10/13, nor was hematuria.  Urine output of 0.6 L yesterday, 0.2 L thus far today.  It has been declining since 10/18.   Weights are up 3 kg from admission.  Net +2 L since admission.   Creatinine, Ser (mg/dL)  Date Value  08/14/2021 3.52 (H)  08/13/2021 1.89 (H)  08/12/2021 1.85 (H)  08/11/2021 1.92 (H)  08/10/2021 2.34 (H)  08/09/2021 2.48 (H)  08/07/2021 1.47 (H)  08/06/2021 1.50 (H)  08/03/2021 2.20 (H)  08/02/2021 2.32 (H)  ] I/Os: I/O last 3 completed shifts: In: 4480.9 [I.V.:1370.8; Blood:815.3; NG/GT:1511.7; IV Piggyback:783.1] Out: 1640 [Urine:1640]   ROS Balance of 12 systems is negative w/  exceptions as above  PMH  Past Medical History:  Diagnosis Date   GAD (generalized anxiety disorder)    Hyperlipidemia    Macular degeneration, bilateral    Retinopathy    Type II diabetes mellitus with complication, uncontrolled    retinopathy, neuropathy, microalbuminuria   PSH  Past Surgical History:  Procedure Laterality Date   APPENDECTOMY     BIOPSY  08/10/2021   Procedure: BIOPSY;  Surgeon: Jose Brace, MD;  Location: [facility];  Service: Gastroenterology;;   BUBBLE STUDY  07/29/2021   Procedure: BUBBLE STUDY;  Surgeon: Jose Pain, MD;  Location: [facility];  Service: Cardiovascular;;   ESOPHAGOGASTRODUODENOSCOPY (EGD) WITH PROPOFOL N/A 08/10/2021   Procedure: ESOPHAGOGASTRODUODENOSCOPY (EGD) WITH PROPOFOL;  Surgeon: Jose Brace, MD;  Location: [facility];  Service: Gastroenterology;  Laterality: N/A;   HERNIA REPAIR     IR FLUORO GUIDED NEEDLE PLC ASPIRATION/INJECTION LOC  07/28/2021   LUMBAR LAMINECTOMY/DECOMPRESSION MICRODISCECTOMY N/A 08/07/2021   Procedure: THORACIC FOUR - THORACIC FIVE LAMINECTOMY/DECOMPRESSION OF SPINAL CORD, DEBRIDEMENT OF ABSCESS, MICRODISCECTOMY, INTRAOPERATIVE ULTRASOUND;  Surgeon: Consuella Lose, MD;  Location: [facility];  Service: Neurosurgery;  Laterality: N/A;   TEE WITHOUT CARDIOVERSION N/A 07/29/2021   Procedure: TRANSESOPHAGEAL ECHOCARDIOGRAM (TEE);  Surgeon: Jose Pain, MD;  Location: [facility];  Service: Cardiovascular;  Laterality: N/A;   TRIGGER FINGER RELEASE Right 10/25/2019   Procedure: RIGHT INDEX FINGER RELEASE TRIGGER FINGER/A-1 PULLEY;  Surgeon: Daryll Brod, MD;  Location: [facility];  Service: Orthopedics;  Laterality: Right;  IV REGIONAL FOREARM BLOCK   FH  Family History  Problem Relation  Age of Onset   Diabetes Mother    Hyperlipidemia Mother    Stroke Mother    Diabetes Father    Hyperlipidemia Brother    Stroke Brother    ADD / ADHD Brother    ADD / ADHD Son    Jose [name]  reports  that he quit smoking about 12 months ago. His smoking use included cigarettes. He has a 20.00 pack-year smoking history. He has never used smokeless tobacco. He reports that he does not currently use alcohol. He reports that he does not use drugs. Allergies  Allergies  Allergen Reactions   Cranberry Itching   Home medications Prior to Admission medications   Medication Sig Start Date End Date Taking? Authorizing Provider  acetaminophen (TYLENOL) 500 MG tablet Take 500 mg by mouth every 6 (six) hours as needed for mild [name], fever or headache.   Yes [provider]  albuterol (VENTOLIN HFA) 108 (90 Base) MCG/ACT inhaler Inhale 1 puff into the lungs every 4 (four) hours as needed for wheezing or shortness of breath. 05/27/21  Yes [provider]  ALPRAZolam (XANAX) 0.25 MG tablet Take 0.25 mg by mouth 2 (two) times daily as needed for anxiety. 06/12/21  Yes [provider]  amLODipine (NORVASC) 10 MG tablet Take 1 tablet (10 mg total) by mouth daily. 08/03/21  Yes Cristal Deer, MD  Ascorbic Acid (VITAMIN C PO) Take 1 tablet by mouth daily.   Yes [provider]  atorvastatin (LIPITOR) 40 MG tablet Take 1 tablet (40 mg total) by mouth daily. 03/12/19  Yes Jacelyn Pi, Lilia Argue, MD  ceFAZolin (ANCEF) IVPB Inject 2 g into the vein every 8 (eight) hours. Indication:  Osteomyelitis  First Dose: No Last Day of Therapy:  09/04/2022  Labs - Once weekly:  CBC/D and BMP, Labs - Every other week:  ESR and CRP Method of administration: IV Push Method of administration may be changed at the discretion of home infusion pharmacist based upon assessment of the patient and/or caregiver's ability to self-administer the medication ordered. 07/31/21  Yes Cristal Deer, MD  chlorproMAZINE (THORAZINE) 25 MG tablet Take 1 tablet (25 mg total) by mouth 3 (three) times daily as needed for hiccoughs. 08/03/21  Yes Cristal Deer, MD  collagenase (SANTYL) ointment Apply topically  daily. 08/03/21  Yes Cristal Deer, MD  cyclobenzaprine (FLEXERIL) 10 MG tablet Take 1 tablet (10 mg total) by mouth 3 (three) times daily. 08/03/21  Yes Cristal Deer, MD  Dulaglutide (TRULICITY) 3 IN/8.6VE SOPN Inject 3 mg as directed once a week. Patient taking differently: Inject 3 mg as directed once a week. thursday 06/11/21  Yes Renato Shin, MD  feeding supplement, GLUCERNA SHAKE, (GLUCERNA SHAKE) LIQD Take 237 mLs by mouth 3 (three) times daily between meals. 08/03/21  Yes Cristal Deer, MD  gabapentin (NEURONTIN) 300 MG capsule TAKE 1 TO 2 CAPSULES(300 TO 600 MG) BY MOUTH AT BEDTIME Patient taking differently: Take 300 mg by mouth 2 (two) times daily. 12/01/19  Yes Jacelyn Pi, Lilia Argue, MD  glimepiride (AMARYL) 1 MG tablet Take 1 tablet (1 mg total) by mouth daily with breakfast. 01/08/21  Yes Renato Shin, MD  hydrALAZINE (APRESOLINE) 50 MG tablet Take 1 tablet (50 mg total) by mouth 3 (three) times daily. 08/03/21  Yes Cristal Deer, MD  lactulose (CHRONULAC) 10 GM/15ML solution Take 30 mLs (20 g total) by mouth 2 (two) times daily. 08/03/21  Yes Cristal Deer, MD  Multiple Vitamin (MULTIVITAMIN ADULT PO) Take 1 tablet by mouth  daily.   Yes [provider]  mupirocin ointment (BACTROBAN) 2 % Apply 1 application topically 2 (two) times daily. 08/03/21  Yes Cristal Deer, MD  Oxycodone HCl 10 MG TABS Take 10 mg by mouth every 3 (three) hours as needed for [name]. 08/03/21  Yes [provider]  senna-docusate (SENOKOT-S) 8.6-50 MG tablet Take 1 tablet by mouth 2 (two) times daily. 08/03/21  Yes Cristal Deer, MD  tadalafil (CIALIS) 20 MG tablet TK 1 T PO  PO QD PRN FOR ERECTILE DYSFUNCTION Patient taking differently: Take 20 mg by mouth daily as needed for erectile dysfunction. 07/20/19  Yes Jacelyn Pi, Irma M, MD  tobramycin (TOBREX) 0.3 % ophthalmic solution Place 1 drop into both eyes See admin instructions. Begin 1 day prior to treatment and continue  the day of treatment and for one full day after treatment. 07/05/18  Yes [provider]  traZODone (DESYREL) 100 MG tablet TAKE 1 TABLET(100 MG) BY MOUTH AT BEDTIME AS NEEDED FOR SLEEP Patient taking differently: Take 100 mg by mouth at bedtime. 03/05/21  Yes Ravi, Himabindu, MD  VITAMIN D PO Take 1 tablet by mouth daily.   Yes [provider]  DULoxetine (CYMBALTA) 60 MG capsule TAKE 1 CAPSULE(60 MG) BY MOUTH DAILY Patient not taking: No sig reported 03/05/21   Einar Grad, Himabindu, MD  Ranibizumab (LUCENTIS) 0.3 MG/0.05ML SOLN 1 Dose by Intravitreal route every 3 (three) months.    [provider]    Current Medications Scheduled Meds:  sodium chloride   Intravenous Once   amLODipine  10 mg Per Tube Daily   baclofen  10 mg Per Tube TID   chlorhexidine gluconate (MEDLINE KIT)  15 mL Mouth Rinse BID   Chlorhexidine Gluconate Cloth  6 each Topical Daily   feeding supplement (PROSource TF)  45 mL Per Tube TID   fluconazole  400 mg Per Tube Daily   gabapentin  300 mg Per Tube Q12H   insulin aspart  0-20 Units Subcutaneous Q4H   insulin aspart  4 Units Subcutaneous Q4H   insulin detemir  15 Units Subcutaneous BID   lactulose  20 g Per Tube TID   lidocaine  2 patch Transdermal Q24H   living well with diabetes book   Does not apply Once   mouth rinse  15 mL Mouth Rinse q12n4p   mouth rinse  15 mL Mouth Rinse 10 times per day   multivitamin with minerals  1 tablet Per Tube Daily   mupirocin ointment  1 application Topical BID   oxyCODONE  10 mg Per Tube Q6H   pantoprazole sodium  40 mg Per Tube Daily   polyethylene glycol  17 g Per Tube TID   senna-docusate  1 tablet Per Tube BID   sodium chloride flush  10-40 mL Intracatheter Q12H   sodium chloride flush  3 mL Intravenous Q12H   sucralfate  1 g Per Tube TID WC & HS   traZODone  100 mg Per Tube QHS   Continuous Infusions:  sodium chloride Stopped (08/13/21 1602)   ampicillin-sulbactam (UNASYN) IV 3 g (08/14/21  0910)   feeding supplement (VITAL 1.5 CAL) 1,000 mL (08/14/21 0529)   fentaNYL infusion INTRAVENOUS 200 mcg/hr (08/14/21 0900)   promethazine (PHENERGAN) injection (IM or IVPB) 25 mg (08/10/21 0506)   propofol (DIPRIVAN) infusion 10 mcg/kg/min (08/14/21 0900)   PRN Meds:.sodium chloride, acetaminophen **OR** acetaminophen, albuterol, bisacodyl, fentaNYL, hydrALAZINE, metoprolol tartrate, midazolam, promethazine (PHENERGAN) injection (IM or IVPB), sodium chloride flush, sodium chloride  flush, sodium phosphate  CBC Recent Labs  Lab 08/12/21 0233 08/12/21 1009 08/13/21 0415 08/13/21 1041 08/14/21 0801  WBC 37.2*  --  35.2*  --  26.3*  NEUTROABS 35.0*  --  30.2*  --  21.3*  HGB 7.5*   < > 7.1* 6.8* 8.1*  HCT 23.2*   < > 21.8* 20.0* 25.8*  MCV 87.9  --  89.7  --  90.5  PLT 452*  --  412*  --  381   < > = values in this interval not displayed.   Basic Metabolic Panel Recent Labs  Lab 08/09/21 0311 08/10/21 0341 08/11/21 0308 08/12/21 0233 08/12/21 1009 08/12/21 1735 08/13/21 0415 08/13/21 1041 08/13/21 1700 08/14/21 0415 08/14/21 0801  NA 134* 135 133* 136 139  --  140 142  --   --  139  K 4.7 4.1 3.6 3.4* 3.7  --  3.9 4.0  --   --  4.7  CL 100 103 103 104  --   --  109  --   --   --  108  CO2 20* 21* 22 23  --   --  22  --   --   --  22  GLUCOSE 247* 286* 231* 80  --   --  208*  --   --   --  319*  BUN 35* 58* 60* 46*  --   --  55*  --   --   --  79*  CREATININE 2.48* 2.34* 1.92* 1.85*  --   --  1.89*  --   --   --  3.52*  CALCIUM 8.5* 8.3* 8.3* 8.5*  --   --  8.4*  --   --   --  8.1*  PHOS 4.6 3.2 3.2  --   --  5.0* 3.8  --  3.5 4.2  --     Physical Exam   Blood pressure 126/68, pulse 78, temperature 98.2 F (36.8 C), temperature source Axillary, resp. rate (!) 22, height _0  (1.803 m), weight 89.9 kg, SpO2 98 %. GEN: Intubated, sedated ENT: ET tube in place EYES: Eyes closed CV: Regular, normal rate, no rub PULM: Coarse breath sounds bilaterally ABD: Soft, no  abdominal fullness SKIN: No rashes or lesions EXT: Trace to 1+ lower extremity edema GU: Normal-appearing genitalia, Foley catheter in place  Assessment 58M AoCKD setting of worsened MSSA OM of thoracic spine requiring surgical decompression, AHRF, Aspiration PNA, long-term exposure to cephalosporins/beta-lactam, ABLA nadir Hb 6.2 on 10/17 with duodenal ulcer and candidal esophagitis, ventilator pendant AHRF  AoCKD3: BL SCr 1.5 to 2.0. Sees [facility].  Most likely ATN.  Rapid increase in creatinine suggest obstruction or rhabdo possibult but clinically not supported.  AIN also possible with long term ABX exposure but such a brisk inc in SCr would be unexpected. MSSA Bacteremia / OM of thoracic spine s/p thoracic decompression 10/15, on Unasyn currently 2/2 #5, RCID following AHRF, ventilatory dependent ABLA, had duodenal ulcer and candidal esophagitis on EGD 10/18 Aspiration pneumonia Longstanding DM2 with microvascular disease COVID positive   Plan Await Renal US CK ordered Caution with use of baclofen and gabapentin with reduced GFR Will follow along No RRT needs at this tme No clear reason for ongoing diuretics at this time Daily weights, Daily Renal Panel, Strict I/Os, Avoid nephrotoxins (NSAIDs, judicious IV Contrast)   Jose [name]  08/14/2021, 3:34 PM

## 2021-08-14 NOTE — Procedures (Signed)
Thoracentesis  Procedure Note  Jose [name]  [id number]  09/14/1976  Date:08/14/21  Time:8:25 AM   Provider Performing:Bryleigh Ottaway   Procedure: Thoracentesis with imaging guidance ([id number])  Indication(s) Pleural Effusion  Consent Risks of the procedure as well as the alternatives and risks of each were explained to the patient and/or caregiver.  Consent for the procedure was obtained and is signed in the bedside chart  Anesthesia Topical only with 1% lidocaine    Time Out Verified patient identification, verified procedure, site/side was marked, verified correct patient position, special equipment/implants available, medications/allergies/relevant history reviewed, required imaging and test results available.   Sterile Technique Maximal sterile technique including full sterile barrier drape, hand hygiene, sterile gown, sterile gloves, mask, hair covering, sterile ultrasound probe cover (if used).  Procedure Description Ultrasound was used to identify appropriate pleural anatomy for placement and overlying skin marked.  Area of drainage cleaned and draped in sterile fashion. Lidocaine was used to anesthetize the skin and subcutaneous tissue.  250 cc's of yellow/clear appearing fluid was drained from the left pleural space. Catheter then removed and bandaid applied to site.   Complications/Tolerance None; patient tolerated the procedure well. Chest X-ray is ordered to confirm no post-procedural complication.   EBL Minimal   Specimen(s) Pleural fluid  [name], D.O.  Internal Medicine Resident, PGY-3 [facility]  Pager: [phone] 8:26 AM, 08/14/2021

## 2021-08-14 NOTE — Progress Notes (Addendum)
I have seen and examined the patient. I have personally reviewed the clinical findings, laboratory findings, microbiological data and imaging studies. The assessment and treatment plan was discussed with the Nurse Practitioner Mauricio Po. I agree with her/his recommendations except following additions/corrections.  SARScov2 PCR positive on 10/13 ( CT value 42 per micro, not suggestive of recent infection), previously tested positive on 05/18/21 then negative ob 07/22/21  Low grade fever+ WBC 35 >26, Hb improved to 8 post transfusion  Intubated but Fio2 down from 90 to 40, sedated  Had thoracentesis in the left side in the am and approx 200-250 cc of fluid aspirated.  Chest xray with improved aeration in the rt but diffuse infiltrate in the left   Continue Unasyn for aspiration pna coverage as well as MSSA, anticipate he will need a longer than 7 days course of Unasyn for aspiration PNA before switching to cefazolin for MSSA bacteremia/vertebral infection  Continue Fluconazole for candida esophagitis (14-21 days) Fu trach cultures and pleural fluid cultures  Monitor CBC and BMP Vent management and supportive care per PCCM  Dr Juleen China is on call this weekend and available with questions. Otherwise new ID team will follow on Monday.  Rosiland Oz, MD Infectious Disease Physician [facility] Disease [address] [place], [place] [address] Phone: [phone]  Fax: [facility] Infectious [facility]  Date of Admission:  08/06/2021     Total days of antibiotics 22         ASSESSMENT:  Mr. Segreto blood cultures from 08/12/21 have been without growth to date. Pleuracentesis performed with 250 cc of fluid and culture pending. Respiratory culture with no growth in <24 hours. Appears to improving with decreased FiO2 and stable chest x-ray. Continue current dose of Unasyn. Will monitor cultures for any organisms. Suspect  will need several more days of Unasyn with eventual return to Cefazolin. Continue respiratory support and medical care per CCM.   Dr. Juleen China is available over the weekend for any ID related questions or concerns. Will follow up on Monday.   PLAN:  Continue Unasyn. Monitor cultures for presence of any organisms. Respiratory and supportive care per primary team.  ID will continue to follow.   Principal Problem:   Osteomyelitis of thoracic spine (HCC) Active Problems:   Type 2 diabetes mellitus with diabetic polyneuropathy, without long-term current use of insulin (HCC)   Hyperlipidemia   GAD (generalized anxiety disorder)   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)   Essential hypertension   CKD (chronic kidney disease) stage 3, GFR 30-59 ml/min (HCC)   Pleural effusion, bilateral    sodium chloride   Intravenous Once   amLODipine  10 mg Per Tube Daily   baclofen  10 mg Per Tube TID   chlorhexidine gluconate (MEDLINE KIT)  15 mL Mouth Rinse BID   Chlorhexidine Gluconate Cloth  6 each Topical Daily   feeding supplement (PROSource TF)  45 mL Per Tube TID   fluconazole  400 mg Per Tube Daily   gabapentin  300 mg Per Tube Q12H   insulin aspart  0-20 Units Subcutaneous Q4H   insulin aspart  4 Units Subcutaneous Q4H   insulin detemir  15 Units Subcutaneous BID   lactulose  20 g Per Tube TID   lidocaine  2 patch Transdermal Q24H   living well with diabetes book   Does not apply Once   mouth rinse  15 mL Mouth Rinse q12n4p  mouth rinse  15 mL Mouth Rinse 10 times per day   multivitamin with minerals  1 tablet Per Tube Daily   mupirocin ointment  1 application Topical BID   oxyCODONE  10 mg Per Tube Q6H   pantoprazole sodium  40 mg Per Tube Daily   polyethylene glycol  17 g Per Tube TID   senna-docusate  1 tablet Per Tube BID   sodium chloride flush  10-40 mL Intracatheter Q12H   sodium chloride flush  3 mL Intravenous Q12H   sucralfate  1 g Per Tube TID WC & HS   traZODone  100  mg Per Tube QHS    SUBJECTIVE:  Afebrile overnight with no acute events. Paracentesis performed returning 250cc of fluid. Remains sedated and intubated. Appears comfortable. Wife at bedside.   Allergies  Allergen Reactions   Cranberry Itching     Review of Systems: Review of Systems  Unable to perform ROS: Intubated     OBJECTIVE: Vitals:   08/14/21 0915 08/14/21 0930 08/14/21 0931 08/14/21 1138  BP: 134/80 137/78    Pulse: 67 73 67   Resp: (!) 22 (!) 22 (!) 22   Temp:    98.2 F (36.8 C)  TempSrc:    Axillary  SpO2: 98% 98% 98%   Weight:      Height:       Body mass index is 27.64 kg/m.  Physical Exam Constitutional:      General: He is not in acute distress.    Appearance: He is well-developed. He is ill-appearing.     Comments: Lying in bed with head of bed elevated; intubated and sedated.   Cardiovascular:     Rate and Rhythm: Normal rate and regular rhythm.     Heart sounds: Normal heart sounds.  Pulmonary:     Effort: Pulmonary effort is normal.     Breath sounds: Normal breath sounds.  Skin:    General: Skin is warm and dry.    Lab Results Lab Results  Component Value Date   WBC 26.3 (H) 08/14/2021   HGB 8.1 (L) 08/14/2021   HCT 25.8 (L) 08/14/2021   MCV 90.5 08/14/2021   PLT 381 08/14/2021    Lab Results  Component Value Date   CREATININE 3.52 (H) 08/14/2021   BUN 79 (H) 08/14/2021   NA 139 08/14/2021   K 4.7 08/14/2021   CL 108 08/14/2021   CO2 22 08/14/2021    Lab Results  Component Value Date   ALT 137 (H) 08/14/2021   AST 500 (H) 08/14/2021   ALKPHOS 739 (H) 08/14/2021   BILITOT 1.8 (H) 08/14/2021     Microbiology: Recent Results (from the past 240 hour(s))  Culture, blood (routine x 2)     Status: None   Collection Time: 08/06/21  9:10 AM   Specimen: BLOOD  Result Value Ref Range Status   Specimen Description BLOOD SITE NOT SPECIFIED  Final   Special Requests   Final    BOTTLES DRAWN AEROBIC AND ANAEROBIC Blood Culture  results may not be optimal due to an inadequate volume of blood received in culture bottles   Culture   Final    NO GROWTH 5 DAYS Performed at [facility], [place] [address], [place], [place] [address]    Report Status 08/11/2021 FINAL  Final  Resp Panel by RT-PCR (Flu A&B, Covid) Nasopharyngeal Swab     Status: Abnormal   Collection Time: 08/06/21  9:36 AM   Specimen: Nasopharyngeal  Swab; Nasopharyngeal(NP) swabs in vial transport medium  Result Value Ref Range Status   SARS Coronavirus 2 by RT PCR POSITIVE (A) NEGATIVE Final    Comment: rn sarah b. [phone] fcp (NOTE) SARS-CoV-2 target nucleic acids are DETECTED.  The SARS-CoV-2 RNA is generally detectable in upper respiratory specimens during the acute phase of infection. Positive results are indicative of the presence of the identified virus, but do not rule out bacterial infection or co-infection with other pathogens not detected by the test. Clinical correlation with patient history and other diagnostic information is necessary to determine patient infection status. The expected result is Negative.  Fact Sheet for Patients: EntrepreneurPulse.com.au  Fact Sheet for Healthcare Providers: IncredibleEmployment.be  This test is not yet approved or cleared by the [place] FDA and  has been authorized for detection and/or diagnosis of SARS-CoV-2 by FDA under an Emergency Use Authorization (EUA).  This EUA will remain in effect (meaning this test can be used) for the duration of  the COVID-19 declaration und er Section 564(b)(1) of the Act, 21 U.S.C. section 360bbb-3(b)(1), unless the authorization is terminated or revoked sooner.     Influenza A by PCR NEGATIVE NEGATIVE Final   Influenza B by PCR NEGATIVE NEGATIVE Final    Comment: (NOTE) The Xpert Xpress SARS-CoV-2/FLU/RSV plus assay is intended as an aid in the diagnosis of influenza from Nasopharyngeal swab specimens  and should not be used as a sole basis for treatment. Nasal washings and aspirates are unacceptable for Xpert Xpress SARS-CoV-2/FLU/RSV testing.  Fact Sheet for Patients: EntrepreneurPulse.com.au  Fact Sheet for Healthcare Providers: IncredibleEmployment.be  This test is not yet approved or cleared by the [place] FDA and has been authorized for detection and/or diagnosis of SARS-CoV-2 by FDA under an Emergency Use Authorization (EUA). This EUA will remain in effect (meaning this test can be used) for the duration of the COVID-19 declaration under Section 564(b)(1) of the Act, 21 U.S.C. section 360bbb-3(b)(1), unless the authorization is terminated or revoked.  Performed at [facility], [place] [address] [place], [place] [address]   Culture, blood (routine x 2)     Status: None   Collection Time: 08/06/21  9:37 AM   Specimen: BLOOD RIGHT HAND  Result Value Ref Range Status   Specimen Description BLOOD RIGHT HAND  Final   Special Requests   Final    BOTTLES DRAWN AEROBIC AND ANAEROBIC Blood Culture adequate volume   Culture   Final    NO GROWTH 5 DAYS Performed at [facility], [place] [address] [place], [place] [address]    Report Status 08/11/2021 FINAL  Final  Urine Culture     Status: None   Collection Time: 08/06/21  1:50 PM   Specimen: Urine, Clean Catch  Result Value Ref Range Status   Specimen Description URINE, CLEAN CATCH  Final   Special Requests NONE  Final   Culture   Final    NO GROWTH Performed at [facility], [place] [address] [place], [place] [address]    Report Status 08/07/2021 FINAL  Final  Surgical pcr screen     Status: None   Collection Time: 08/06/21  7:45 PM   Specimen: Nasal Mucosa; Nasal Swab  Result Value Ref Range Status   MRSA, PCR NEGATIVE NEGATIVE Final   Staphylococcus aureus NEGATIVE NEGATIVE Final    Comment: (NOTE) The Xpert SA Assay (FDA approved for NASAL specimens in  patients 53 years of age and older), is  one component of a comprehensive surveillance program. It is not intended to diagnose infection nor to guide or monitor treatment. Performed at [facility], [place] [address] [place], [place] [address]   Aerobic/Anaerobic Culture w Gram Stain (surgical/deep wound)     Status: None   Collection Time: 08/07/21  5:04 PM   Specimen: PATH Other; Tissue  Result Value Ref Range Status   Specimen Description ABSCESS  Final   Special Requests EPIDURAL SWABS PT ON ANCEF VANC MAXIPIME  Final   Gram Stain NO WBC SEEN NO ORGANISMS SEEN   Final   Culture   Final    RARE STAPHYLOCOCCUS AUREUS NO ANAEROBES ISOLATED Performed at [facility], [place] [address], [place], [place] [address]    Report Status 08/12/2021 FINAL  Final   Organism ID, Bacteria STAPHYLOCOCCUS AUREUS  Final      Susceptibility   Staphylococcus aureus - MIC*    CIPROFLOXACIN <=0.5 SENSITIVE Sensitive     ERYTHROMYCIN RESISTANT Resistant     GENTAMICIN <=0.5 SENSITIVE Sensitive     OXACILLIN <=0.25 SENSITIVE Sensitive     TETRACYCLINE <=1 SENSITIVE Sensitive     VANCOMYCIN 1 SENSITIVE Sensitive     TRIMETH/SULFA <=10 SENSITIVE Sensitive     CLINDAMYCIN RESISTANT Resistant     RIFAMPIN <=0.5 SENSITIVE Sensitive     Inducible Clindamycin POSITIVE Resistant     * RARE STAPHYLOCOCCUS AUREUS  Aerobic/Anaerobic Culture w Gram Stain (surgical/deep wound)     Status: None   Collection Time: 08/07/21  5:05 PM   Specimen: PATH Other; Tissue  Result Value Ref Range Status   Specimen Description TISSUE  Final   Special Requests EPIDURAL PT ON VANC MAXIPIME ANCEF  Final   Gram Stain NO WBC SEEN NO ORGANISMS SEEN   Final   Culture   Final    RARE STAPHYLOCOCCUS AUREUS SUSCEPTIBILITIES PERFORMED ON PREVIOUS CULTURE WITHIN THE LAST 5 DAYS. RESULT CALLED TO, READ BACK BY AND VERIFIED WITH: RN L.ILLERBRUN ON [id number] AT 1300 BY E.PARRISH NO ANAEROBES ISOLATED Performed at [facility], [place] [address], [place], [place] [address]    Report Status 08/12/2021 FINAL  Final  Culture, blood (routine x 2)     Status: None (Preliminary result)   Collection Time: 08/12/21  8:19 AM   Specimen: BLOOD  Result Value Ref Range Status   Specimen Description BLOOD LEFT ANTECUBITAL  Final   Special Requests   Final    BOTTLES DRAWN AEROBIC AND ANAEROBIC Blood Culture adequate volume   Culture   Final    NO GROWTH 1 DAY Performed at [facility], [place] [address] [place], [place] [address]    Report Status PENDING  Incomplete  Culture, blood (routine x 2)     Status: None (Preliminary result)   Collection Time: 08/12/21  8:20 AM   Specimen: BLOOD LEFT HAND  Result Value Ref Range Status   Specimen Description BLOOD LEFT HAND  Final   Special Requests   Final    BOTTLES DRAWN AEROBIC ONLY Blood Culture results may not be optimal due to an inadequate volume of blood received in culture bottles   Culture   Final    NO GROWTH 1 DAY Performed at [facility], [place] [address] [place], [place] [address]    Report Status PENDING  Incomplete  Culture, Respiratory w Gram Stain     Status: None (Preliminary result)   Collection Time: 08/13/21  4:30 PM  Specimen: Tracheal Aspirate; Respiratory  Result Value Ref Range Status   Specimen Description TRACHEAL ASPIRATE  Final   Special Requests Normal  Final   Gram Stain   Final    NO SQUAMOUS EPITHELIAL CELLS SEEN FEW WBC SEEN NO ORGANISMS SEEN    Culture   Final    NO GROWTH < 12 HOURS Performed at [facility], [address], [place], [place] [address]    Report Status PENDING  Incomplete   Pertinent Imagings DG CHEST PORT 1 VIEW  Result Date: 08/14/2021 CLINICAL DATA:  CAP (community acquired pneumonia) J18.9 (ICD-10-CM) EXAM: PORTABLE CHEST 1 VIEW COMPARISON:  August 14, 2021. FINDINGS: Slightly improved extensive patchy airspace opacities in the left lung. Right lung remains clear. Endotracheal tube  tip is immediately inferior to the level of the clavicular heads, unchanged. Enteric tube courses below the diaphragm with the tip outside the field of view. No visible pleural effusions or pneumothorax on this semi erect AP radiograph. No acute osseous abnormality. IMPRESSION: Slightly improved extensive patchy airspace opacities in the left lung, compatible with pneumonia. Electronically Signed   By: Margaretha Sheffield M.D.   On: 08/14/2021 12:36   DG CHEST PORT 1 VIEW  Result Date: 08/14/2021 CLINICAL DATA:  Respiratory failure EXAM: PORTABLE CHEST 1 VIEW COMPARISON:  08/13/2021 FINDINGS: Cardiac shadow remains enlarged. Right-sided PICC line is noted in the distal superior vena cava. Endotracheal tube and weighted feeding catheter are in satisfactory position. Right lung is now clear. Diffuse increased airspace opacity is again noted on the left stable from the previous day. IMPRESSION: Improved aeration on the right. Tubes and lines as described above. Diffuse infiltrate throughout the left lung. Electronically Signed   By: Inez Catalina M.D.   On: 08/14/2021 03:41   DG CHEST PORT 1 VIEW  Result Date: 08/13/2021 CLINICAL DATA:  Ventilator dependent EXAM: PORTABLE CHEST 1 VIEW COMPARISON:  08/12/2021 FINDINGS: Endotracheal tube terminates 5 cm above the carina. Enteric tube courses to the stomach. Multifocal patchy opacities throughout the left lung. Mild patchy opacities in the right lung. No pneumothorax. The heart is normal in size. Right arm PICC terminates at the cavoatrial junction. IMPRESSION: Endotracheal tube terminates 5 cm above the carina. Additional support apparatus as above. Multifocal pneumonia, left lung predominant. Electronically Signed   By: Julian Hy M.D.   On: 08/13/2021 01:48   DG CHEST PORT 1 VIEW  Result Date: 08/12/2021 CLINICAL DATA:  Intubation, endotracheal tube retracted EXAM: PORTABLE CHEST 1 VIEW COMPARISON:  08/12/2021 FINDINGS: Endotracheal tube is 2.4 cm  above the carina. Diffuse airspace disease throughout the left lung. Patchy airspace disease in the right. These findings are unchanged. No visible effusion or pneumothorax. Right PICC line tip is in the SVC, unchanged. IMPRESSION: Endotracheal tube retracted, now 2.4 cm above the carina. Otherwise no change. Electronically Signed   By: Rolm Baptise M.D.   On: 08/12/2021 16:46      Terri Piedra, NP [place] Infectious [facility]  08/14/2021  12:08 PM

## 2021-08-15 LAB — TYPE AND SCREEN
ABO/RH(D): A POS
Antibody Screen: NEGATIVE
Unit division: 0
Unit division: 0
Unit division: 0

## 2021-08-15 LAB — CBC
HCT: 25.4 % — ABNORMAL LOW (ref 39.0–52.0)
Hemoglobin: 8 g/dL — ABNORMAL LOW (ref 13.0–17.0)
MCH: 28.2 pg (ref 26.0–34.0)
MCHC: 31.5 g/dL (ref 30.0–36.0)
MCV: 89.4 fL (ref 80.0–100.0)
Platelets: 377 10*3/uL (ref 150–400)
RBC: 2.84 MIL/uL — ABNORMAL LOW (ref 4.22–5.81)
RDW: 15.4 % (ref 11.5–15.5)
WBC: 22 10*3/uL — ABNORMAL HIGH (ref 4.0–10.5)
nRBC: 0.1 % (ref 0.0–0.2)

## 2021-08-15 LAB — BPAM RBC
Blood Product Expiration Date: 202210212359
Blood Product Expiration Date: 202211032359
Blood Product Expiration Date: 202211042359
ISSUE DATE / TIME: 202210180652
ISSUE DATE / TIME: 202210201524
ISSUE DATE / TIME: 202210210009
Unit Type and Rh: 600
Unit Type and Rh: 6200
Unit Type and Rh: 6200

## 2021-08-15 LAB — MAGNESIUM: Magnesium: 2.6 mg/dL — ABNORMAL HIGH (ref 1.7–2.4)

## 2021-08-15 LAB — COMPREHENSIVE METABOLIC PANEL
ALT: 211 U/L — ABNORMAL HIGH (ref 0–44)
AST: 580 U/L — ABNORMAL HIGH (ref 15–41)
Albumin: <1.5 g/dL — ABNORMAL LOW (ref 3.5–5.0)
Alkaline Phosphatase: 804 U/L — ABNORMAL HIGH (ref 38–126)
Anion gap: 11 (ref 5–15)
BUN: 97 mg/dL — ABNORMAL HIGH (ref 6–20)
CO2: 22 mmol/L (ref 22–32)
Calcium: 8.3 mg/dL — ABNORMAL LOW (ref 8.9–10.3)
Chloride: 107 mmol/L (ref 98–111)
Creatinine, Ser: 4.23 mg/dL — ABNORMAL HIGH (ref 0.61–1.24)
GFR, Estimated: 17 mL/min — ABNORMAL LOW (ref >60)
Glucose, Bld: 225 mg/dL — ABNORMAL HIGH (ref 70–99)
Potassium: 4.6 mmol/L (ref 3.5–5.1)
Sodium: 140 mmol/L (ref 135–145)
Total Bilirubin: 1.7 mg/dL — ABNORMAL HIGH (ref 0.3–1.2)
Total Protein: 5.7 g/dL — ABNORMAL LOW (ref 6.5–8.1)

## 2021-08-15 LAB — CULTURE, RESPIRATORY W GRAM STAIN
Culture: NO GROWTH
Gram Stain: NONE SEEN
Special Requests: NORMAL

## 2021-08-15 LAB — GLUCOSE, CAPILLARY
Glucose-Capillary: 247 mg/dL — ABNORMAL HIGH (ref 70–99)
Glucose-Capillary: 195 mg/dL — ABNORMAL HIGH (ref 70–99)
Glucose-Capillary: 168 mg/dL — ABNORMAL HIGH (ref 70–99)
Glucose-Capillary: 160 mg/dL — ABNORMAL HIGH (ref 70–99)
Glucose-Capillary: 129 mg/dL — ABNORMAL HIGH (ref 70–99)
Glucose-Capillary: 122 mg/dL — ABNORMAL HIGH (ref 70–99)

## 2021-08-15 LAB — PHOSPHORUS: Phosphorus: 3.2 mg/dL (ref 2.5–4.6)

## 2021-08-15 MED ORDER — METHYLNALTREXONE BROMIDE 12 MG/0.6ML ~~LOC~~ SOLN
12.0000 mg | Freq: Once | SUBCUTANEOUS | Status: AC
  Administered 2021-08-15: 12 mg via SUBCUTANEOUS
  Filled 2021-08-15: qty 0.6

## 2021-08-15 MED ORDER — SUCRALFATE 1 GM/10ML PO SUSP
1.0000 g | Freq: Three times a day (TID) | ORAL | Status: DC
  Administered 2021-08-15 – 2021-08-19 (×14): 1 g
  Filled 2021-08-15 (×16): qty 10

## 2021-08-15 MED ORDER — INSULIN ASPART 100 UNIT/ML IJ SOLN
8.0000 [IU] | INTRAMUSCULAR | Status: DC
  Administered 2021-08-15 – 2021-08-16 (×6): 8 [IU] via SUBCUTANEOUS

## 2021-08-15 MED ORDER — FLUCONAZOLE 200 MG PO TABS
200.0000 mg | ORAL_TABLET | Freq: Every day | ORAL | Status: DC
  Administered 2021-08-16 – 2021-08-18 (×3): 200 mg
  Filled 2021-08-15 (×4): qty 1

## 2021-08-15 MED ORDER — BACLOFEN 10 MG PO TABS
5.0000 mg | ORAL_TABLET | Freq: Three times a day (TID) | ORAL | Status: DC
  Administered 2021-08-15 – 2021-08-16 (×5): 5 mg
  Filled 2021-08-15 (×5): qty 1

## 2021-08-15 MED ORDER — INSULIN DETEMIR 100 UNIT/ML ~~LOC~~ SOLN
25.0000 [IU] | Freq: Two times a day (BID) | SUBCUTANEOUS | Status: DC
  Administered 2021-08-15 – 2021-08-16 (×4): 25 [IU] via SUBCUTANEOUS
  Filled 2021-08-15 (×6): qty 0.25

## 2021-08-15 MED ORDER — SODIUM CHLORIDE 0.9 % IV SOLN
3.0000 g | Freq: Two times a day (BID) | INTRAVENOUS | Status: DC
  Administered 2021-08-15 – 2021-08-17 (×4): 3 g via INTRAVENOUS
  Filled 2021-08-15 (×4): qty 8

## 2021-08-15 NOTE — Progress Notes (Signed)
NAME:  Jose [name], MRN:  [id number], DOB:  12/25/1975, LOS: 9 ADMISSION DATE:  08/06/2021, CONSULTATION DATE:  08/10/2021 REFERRING MD:  TRH, CHIEF COMPLAINT:  Hypoxia   Brief History   Jose [name] is a 45 y.o. 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 On 10/17 patient developed coffee-ground emesis, now s/p EGD showing circumferential esophagitis with hemorrhage and non-bleeding duodenal ulcers now on BID PPI. On 10/17, pccm was consulted for acute hypoxia.   Past Medical History   has a past medical history of GAD (generalized anxiety disorder), Hyperlipidemia, Macular degeneration, bilateral, Retinopathy, and Type II diabetes mellitus with complication, uncontrolled.  Significant Hospital Events   10/13 -admitted with worsening thoracic osteomyelitis, constipation/abd distention, neurosurgery consulted  10/14 - T4-T5 laminectomy, left T5 pediculotomy, and spinal cord decompression 10/17 - Hgb drop to 6.2, coffee ground emesis s/p EGD with esophagitis and peptic ulcer 10/17 - hypoxia and pccm consult  10/19 intubated  Consults:  Neurosurgery Infection disease PCCM  Procedures:  10/14 - T4-T5 laminectomy, left T5 pediculotomy, and spinal cord decompression 10/17 - EGD 10/19 intubation  Significant Diagnostic Tests:  CT Abd/Pelvis Result Date: 08/06/2021 IMPRESSION: 1. Generally large burden of stool and stool balls throughout the colon. 2. Distended urinary bladder, measuring up to 19.0 cm. Correlate for urinary retention. 3. Anasarca.  CTA: Result Date: 08/06/2021 1. Negative  examination for pulmonary embolism. 2. Moderate bilateral pleural effusions and associated atelectasis or consolidation, new compared to prior examination. 3. There is substantial interval worsening of destructive lytic change about the T4-T5 disc space. Overlying paravertebral soft tissue thickening. Findings are consistent with worsened discitis osteomyelitis but in general better assessed by same-day MR. 4. Unchanged enlarged right hilar and subcarinal lymph nodes, likely reactive. 5. Cardiomegaly and coronary artery disease.  MR Thoracic Spine: Result Date: 08/06/2021 1. Redemonstrated abnormal bone marrow signal and enhancement involving the T4 and T5 levels with increased surrounding paraspinal phlegmon and new epidural phlegmon (6 mm thick ventrally). Interval T5 pathologic fracture (50-60% height loss) with new 4 mm of bony retropulsion. Constellation of findings results in new moderate to severe left eccentric canal stenosis at T4 with cord deformity. 2. Overall, findings are most likely secondary to progressive discitis/osteomyelitis. Superimposed osseous metastatic disease or multiple myeloma is not excluded by imaging. Recommend correlation with the results of reported recent biopsy. 3. Multiple small (subcentimeter) fluid collections in the left posterior paraspinal phlegmon could represent tiny abscesses  DG CHEST PORT 1 VIEW Result Date: 08/10/2021 IMPRESSION: Extensive airspace opacity throughout the left lung field. Comparatively mild opacity within the right lung base. Given the asymmetry, these findings likely reflect multifocal pneumonia. Small left pleural effusion.   EGD: Result Date: 08/10/2021 LA Grade D (one or more mucosal breaks involving at least 75% of esophageal circumference) esophagitis with no bleeding was found in the entire esophagus. Biopsies were taken with a cold forceps for histology. Findings: Hematin (altered blood/coffee-ground-like material) was found  in the gastric body. No gross lesions were noted in the entire examined stomach. Biopsies were taken with a cold forceps for histology. Few non-bleeding superficial duodenal ulcers with no stigmata of bleeding were found in the duodenal bulb. The largest  lesion was 15 mm in largest dimension. Biopsies were taken with a cold forceps for histology. The second portion of the duodenum was normal.  CT HEAD WO CONTRAST (5MM) Result Date: 08/11/2021 IMPRESSION: No acute intracranial process.   DG CHEST PORT 1 VIEW Result Date: 08/11/2021 IMPRESSION: No change from the prior study.      Micro Data:  10/13 - Bcx no growth x5d 10/13 - Ucx no growth 10/18 - resp culture pending   Antimicrobials:  Cefepime 10/13-10/13  Cefazolin 10/13 > Zosyn 10/17-10/17  Vancomycin 10/17-10  Interim History/Subjective:  No events.  Remains intubated and sedated. Renal function unfortunately worsening.  Objective:  Blood pressure 112/66, pulse 71, temperature 98.4 F (36.9 C), temperature source Axillary, resp. rate (!) 22, height 5' 11" (1.803 m), weight 93.4 kg, SpO2 97 %. CVP:  [18 mmHg-21 mmHg] 20 mmHg  Vent Mode: PRVC FiO2 (%):  [40 %] 40 % Set Rate:  [22 bmp] 22 bmp Vt Set:  [450 mL] 450 mL PEEP:  [8 [id number] cmH20] 8 cmH20 Plateau Pressure:  [18 cmH20-22 cmH20] 22 cmH20   Intake/Output Summary (Last 24 hours) at 08/15/2021 7342 Last data filed at 08/15/2021 8768 Gross per 24 hour  Intake 533.19 ml  Output 436 ml  Net 97.19 ml    Filed Weights   08/13/21 0500 08/14/21 0322 08/15/21 0552  Weight: 87.2 kg 89.9 kg 93.4 kg    Examination: Sedated on vent Lungs actually sound a little better today Not withdrawing to pain at current sedation Ext without edema   Acute liver injury stable Alk phos up a bit more WBC improved BUN Cr up Oliguric Pleural fluid c/w parapneumonic, gram stain neg CXR looks better  Resolved Hospital Problem list     Assessment & Plan:   Acute  hypoxic respiratory failure due to splinting, likely aspiration with severe disease in left lung; intubated 10/19 MSSA osteomyelitis of T4/5- with spinal cord bompression s/p T4/5 laminectomy, left T5 pediculotomy to decompress spinal cord; postop course complicated by severe pain COVID positive with infection over a month ago ABLA from candidal esophagitis with hematemesis, duodenal ulcer- stable H/H after transfusion 10/21 DM2 with hyperglycemia Opiate and immobility induced severe constipation Worsening AKI/Septic ATN on top of CKD3a Shock liver- stable, nothing on CT A/P a few days ago  - Vent support, VAP prevention bundle - Unasyn with duration TBD - Carafate and PPI as ordered - Fluconazole with duration TBD, need to keep eye on liver indices - Sedation titrated to vent synchrony - Increase levemir - Strengthen bowel regimen: enema and repeat relistor today - Will need multimodal pain regimen including PCA on extubation - Discussed with wife: his O2 needs are down and X-ray looks better; would like to get bowel working and determine destination with kidney (recovery vs. need for HD initiation) prior to extubation; she agrees  [employer]:  Diet: tubefeeds Pain/Anxiety/Delirium protocol (if indicated): see orders VAP protocol (if indicated): yes DVT prophylaxis: SCD given ongoing blood loss, will need to rechallenge at some point GI prophylaxis: PPI Glucose control: SSI Yes and Basal insulin Yes Lines: N/A Foley: yes needed for retention Mobility: bed rest, Code Status: full code Family Communication: updated today by phone Disposition: ICU  35 min cc time Erskine Emery MD PCCM

## 2021-08-15 NOTE — Progress Notes (Addendum)
PHARMACY NOTE:  ANTIMICROBIAL RENAL DOSAGE ADJUSTMENT  Current antimicrobial regimen includes a mismatch between antimicrobial dosage and estimated renal function.  As per policy approved by the Pharmacy & Therapeutics and Medical Executive Committees, the antimicrobial dosage will be adjusted accordingly.  Current antimicrobial dosage:  Unasyn 3g q6h; fluconazole 400mg  q24h  Indication: PNA + MSSA bacteremia/vertebral infection; candida esophagitis  Renal Function:  Estimated Creatinine Clearance: 25.2 mL/min (A) (by C-G formula based on SCr of 4.23 mg/dL (H)).    Antimicrobial dosage has been changed to:  Unasyn 3g q12h; fluconazole 200mg  q24h  Additional comments:   Thank you for allowing pharmacy to be a part of this patient's care.  Cristela Felt, PharmD, BCPS Clinical Pharmacist 08/15/2021 2:11 PM

## 2021-08-15 NOTE — Progress Notes (Signed)
Admit: 08/06/2021 LOS: 9  38M AoCKD setting of worsened MSSA OM of thoracic spine requiring surgical decompression, AHRF, Aspiration PNA, long-term exposure to cephalosporins/beta-lactam, ABLA nadir Hb 6.2 on 10/17 with duodenal ulcer and candidal esophagitis, ventilator pendant AHRF  Subjective:  SCr worsened to 4.2, K 4.6, HCo3 22  UOP 0.4L BPs stable Renal US w/o acute structural issues C4 WNL  10/21 0701 - 10/22 0700 In: 700.8 [I.V.:400.8; NG/GT:100; IV Piggyback:200] Out: 436 [Urine:436]  Filed Weights   08/13/21 0500 08/14/21 0322 08/15/21 0552  Weight: 87.2 kg 89.9 kg 93.4 kg    Scheduled Meds:  sodium chloride   Intravenous Once   amLODipine  10 mg Per Tube Daily   baclofen  10 mg Per Tube TID   chlorhexidine gluconate (MEDLINE KIT)  15 mL Mouth Rinse BID   Chlorhexidine Gluconate Cloth  6 each Topical Daily   feeding supplement (PROSource TF)  45 mL Per Tube TID   fluconazole  400 mg Per Tube Daily   gabapentin  300 mg Per Tube Q12H   insulin aspart  0-20 Units Subcutaneous Q4H   insulin aspart  4 Units Subcutaneous Q4H   insulin detemir  15 Units Subcutaneous BID   lactulose  20 g Per Tube TID   lidocaine  2 patch Transdermal Q24H   living well with diabetes book   Does not apply Once   mouth rinse  15 mL Mouth Rinse q12n4p   mouth rinse  15 mL Mouth Rinse 10 times per day   multivitamin with minerals  1 tablet Per Tube Daily   mupirocin ointment  1 application Topical BID   oxyCODONE  10 mg Per Tube Q6H   pantoprazole sodium  40 mg Per Tube Daily   polyethylene glycol  17 g Per Tube TID   senna-docusate  1 tablet Per Tube BID   sodium chloride flush  10-40 mL Intracatheter Q12H   sodium chloride flush  3 mL Intravenous Q12H   sucralfate  1 g Per Tube TID WC & HS   traZODone  100 mg Per Tube QHS   Continuous Infusions:  sodium chloride Stopped (08/13/21 1602)   ampicillin-sulbactam (UNASYN) IV 3 g (08/15/21 0400)   feeding supplement (VITAL 1.5 CAL) 1,000  mL (08/15/21 5997)   fentaNYL infusion INTRAVENOUS 275 mcg/hr (08/15/21 0157)   promethazine (PHENERGAN) injection (IM or IVPB) 25 mg (08/10/21 0506)   propofol (DIPRIVAN) infusion 30 mcg/kg/min (08/14/21 2259)   PRN Meds:.sodium chloride, acetaminophen **OR** acetaminophen, albuterol, bisacodyl, fentaNYL, hydrALAZINE, metoprolol tartrate, midazolam, promethazine (PHENERGAN) injection (IM or IVPB), sodium chloride flush, sodium chloride flush, sodium phosphate  Current Labs: reviewed    Physical Exam:  Blood pressure 109/67, pulse 70, temperature 98.4 F (36.9 C), temperature source Axillary, resp. rate (!) 22, height 5' 11"  (1.803 m), weight 93.4 kg, SpO2 97 %. GEN: Intubated, sedated ENT: ET tube in place EYES: Eyes closed CV: Regular, normal rate, no rub PULM: Coarse breath sounds bilaterally ABD: Soft, no abdominal fullness SKIN: No rashes or lesions EXT: Trace to 1+ lower extremity edema GU: Normal-appearing genitalia, Foley catheter in place  A AoCKD3: BL SCr 1.5 to 2.0. Sees [facility].  Most likely ATN.  UA with some hematuria/pyuria, could have infection related GN.   Renal US 10/21 negative MSSA Bacteremia / OM of thoracic spine s/p thoracic decompression 10/15, on Unasyn currently 2/2 #5, RCID following AHRF, ventilator dependent ABLA, had duodenal ulcer and candidal esophagitis on EGD 10/18 Aspiration pneumonia Longstanding DM2 with  microvascular disease COVID positive   P Check C3 and C4 Will follow long Daily weights, Daily Renal Panel, Strict I/Os, Avoid nephrotoxins (NSAIDs, judicious IV Contrast)  No RRT indications Cont supportive care  Pearson Grippe MD 08/15/2021, 8:45 AM  Recent Labs  Lab 08/14/21 0415 08/14/21 0801 08/14/21 1700 08/15/21 0545  NA  --  139 137 140  K  --  4.7 4.8 4.6  CL  --  108 105 107  CO2  --  22 21* 22  GLUCOSE  --  319* 365* 225*  BUN  --  79* 85* 97*  CREATININE  --  3.52* 3.78* 4.23*  CALCIUM  --  8.1* 8.2* 8.3*  PHOS  4.2  --  3.7 3.2   Recent Labs  Lab 08/12/21 0233 08/12/21 1009 08/13/21 0415 08/13/21 1041 08/14/21 0801 08/15/21 0545  WBC 37.2*  --  35.2*  --  26.3* 22.0*  NEUTROABS 35.0*  --  30.2*  --  21.3*  --   HGB 7.5*   < > 7.1* 6.8* 8.1* 8.0*  HCT 23.2*   < > 21.8* 20.0* 25.8* 25.4*  MCV 87.9  --  89.7  --  90.5 89.4  PLT 452*  --  412*  --  381 377   < > = values in this interval not displayed.

## 2021-08-15 NOTE — Progress Notes (Signed)
RT NOTE: attempted SBT on patient this AM on CPAP/PSV of 12/8 however patient went apneic and backup ventilator alarmed.  Placed patient back on full support settings and is tolerating well.  Will continue to monitor.

## 2021-08-16 DIAGNOSIS — Z515 Encounter for palliative care: Secondary | ICD-10-CM | POA: Diagnosis not present

## 2021-08-16 DIAGNOSIS — M4624 Osteomyelitis of vertebra, thoracic region: Secondary | ICD-10-CM | POA: Diagnosis not present

## 2021-08-16 DIAGNOSIS — J9691 Respiratory failure, unspecified with hypoxia: Secondary | ICD-10-CM | POA: Diagnosis not present

## 2021-08-16 DIAGNOSIS — M869 Osteomyelitis, unspecified: Secondary | ICD-10-CM | POA: Diagnosis not present

## 2021-08-16 LAB — COMPREHENSIVE METABOLIC PANEL
ALT: 157 U/L — ABNORMAL HIGH (ref 0–44)
AST: 253 U/L — ABNORMAL HIGH (ref 15–41)
Albumin: <1.5 g/dL — ABNORMAL LOW (ref 3.5–5.0)
Alkaline Phosphatase: 836 U/L — ABNORMAL HIGH (ref 38–126)
Anion gap: 10 (ref 5–15)
BUN: 107 mg/dL — ABNORMAL HIGH (ref 6–20)
CO2: 21 mmol/L — ABNORMAL LOW (ref 22–32)
Calcium: 8.1 mg/dL — ABNORMAL LOW (ref 8.9–10.3)
Chloride: 107 mmol/L (ref 98–111)
Creatinine, Ser: 4.43 mg/dL — ABNORMAL HIGH (ref 0.61–1.24)
GFR, Estimated: 16 mL/min — ABNORMAL LOW (ref >60)
Glucose, Bld: 139 mg/dL — ABNORMAL HIGH (ref 70–99)
Potassium: 4.7 mmol/L (ref 3.5–5.1)
Sodium: 138 mmol/L (ref 135–145)
Total Bilirubin: 2.3 mg/dL — ABNORMAL HIGH (ref 0.3–1.2)
Total Protein: 5.5 g/dL — ABNORMAL LOW (ref 6.5–8.1)

## 2021-08-16 LAB — CBC
HCT: 27.2 % — ABNORMAL LOW (ref 39.0–52.0)
Hemoglobin: 8.6 g/dL — ABNORMAL LOW (ref 13.0–17.0)
MCH: 28.8 pg (ref 26.0–34.0)
MCHC: 31.6 g/dL (ref 30.0–36.0)
MCV: 91 fL (ref 80.0–100.0)
Platelets: 381 10*3/uL (ref 150–400)
RBC: 2.99 MIL/uL — ABNORMAL LOW (ref 4.22–5.81)
RDW: 15.6 % — ABNORMAL HIGH (ref 11.5–15.5)
WBC: 22 10*3/uL — ABNORMAL HIGH (ref 4.0–10.5)
nRBC: 0 % (ref 0.0–0.2)

## 2021-08-16 LAB — GLUCOSE, CAPILLARY
Glucose-Capillary: 118 mg/dL — ABNORMAL HIGH (ref 70–99)
Glucose-Capillary: 126 mg/dL — ABNORMAL HIGH (ref 70–99)
Glucose-Capillary: 87 mg/dL (ref 70–99)
Glucose-Capillary: 89 mg/dL (ref 70–99)
Glucose-Capillary: 124 mg/dL — ABNORMAL HIGH (ref 70–99)
Glucose-Capillary: 169 mg/dL — ABNORMAL HIGH (ref 70–99)

## 2021-08-16 LAB — C3 COMPLEMENT: C3 Complement: 155 mg/dL (ref 82–167)

## 2021-08-16 LAB — C4 COMPLEMENT: Complement C4, Body Fluid: 19 mg/dL (ref 12–38)

## 2021-08-16 MED ORDER — HEPARIN SODIUM (PORCINE) 5000 UNIT/ML IJ SOLN
5000.0000 [IU] | Freq: Three times a day (TID) | INTRAMUSCULAR | Status: DC
  Administered 2021-08-16 – 2021-08-18 (×7): 5000 [IU] via SUBCUTANEOUS
  Filled 2021-08-16 (×7): qty 1

## 2021-08-16 MED ORDER — DEXMEDETOMIDINE HCL IN NACL 400 MCG/100ML IV SOLN
0.0000 ug/kg/h | INTRAVENOUS | Status: DC
Start: 2021-08-16 — End: 2021-08-16
  Administered 2021-08-16: 0.4 ug/kg/h via INTRAVENOUS
  Filled 2021-08-16: qty 100

## 2021-08-16 MED ORDER — DEXMEDETOMIDINE HCL IN NACL 400 MCG/100ML IV SOLN
0.4000 ug/kg/h | INTRAVENOUS | Status: DC
Start: 2021-08-16 — End: 2021-08-18
  Administered 2021-08-16: 0.4 ug/kg/h via INTRAVENOUS
  Administered 2021-08-17: 1.2 ug/kg/h via INTRAVENOUS
  Administered 2021-08-17: 0.5 ug/kg/h via INTRAVENOUS
  Filled 2021-08-16 (×3): qty 100

## 2021-08-16 NOTE — Progress Notes (Addendum)
NAME:  Jose [name], MRN:  [id number], DOB:  17-Sep-1976, LOS: 53 ADMISSION DATE:  08/06/2021, CONSULTATION DATE:  08/10/2021 REFERRING MD:  TRH, CHIEF COMPLAINT:  Hypoxia   Brief History   [name] is a 45 y.o. 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 On 10/17 patient developed coffee-ground emesis, now s/p EGD showing circumferential esophagitis with hemorrhage and non-bleeding duodenal ulcers now on BID PPI. On 10/17, pccm was consulted for acute hypoxia.   Past Medical History   has a past medical history of GAD (generalized anxiety disorder), Hyperlipidemia, Macular degeneration, bilateral, Retinopathy, and Type II diabetes mellitus with complication, uncontrolled.  Significant Hospital Events   10/13 -admitted with worsening thoracic osteomyelitis, constipation/abd distention, neurosurgery consulted  10/14 - T4-T5 laminectomy, left T5 pediculotomy, and spinal cord decompression 10/17 - Hgb drop to 6.2, coffee ground emesis s/p EGD with esophagitis and peptic ulcer 10/17 - hypoxia and pccm consult  10/19 intubated  Consults:  Neurosurgery Infection disease PCCM  Procedures:  10/14 - T4-T5 laminectomy, left T5 pediculotomy, and spinal cord decompression 10/17 - EGD 10/19 intubation  Significant Diagnostic Tests:  CT Abd/Pelvis Result Date: 08/06/2021 IMPRESSION: 1. Generally large burden of stool and stool balls throughout the colon. 2. Distended urinary bladder, measuring up to 19.0 cm. Correlate for urinary retention. 3. Anasarca.  CTA: Result Date: 08/06/2021 1. Negative  examination for pulmonary embolism. 2. Moderate bilateral pleural effusions and associated atelectasis or consolidation, new compared to prior examination. 3. There is substantial interval worsening of destructive lytic change about the T4-T5 disc space. Overlying paravertebral soft tissue thickening. Findings are consistent with worsened discitis osteomyelitis but in general better assessed by same-day MR. 4. Unchanged enlarged right hilar and subcarinal lymph nodes, likely reactive. 5. Cardiomegaly and coronary artery disease.  MR Thoracic Spine: Result Date: 08/06/2021 1. Redemonstrated abnormal bone marrow signal and enhancement involving the T4 and T5 levels with increased surrounding paraspinal phlegmon and new epidural phlegmon (6 mm thick ventrally). Interval T5 pathologic fracture (50-60% height loss) with new 4 mm of bony retropulsion. Constellation of findings results in new moderate to severe left eccentric canal stenosis at T4 with cord deformity. 2. Overall, findings are most likely secondary to progressive discitis/osteomyelitis. Superimposed osseous metastatic disease or multiple myeloma is not excluded by imaging. Recommend correlation with the results of reported recent biopsy. 3. Multiple small (subcentimeter) fluid collections in the left posterior paraspinal phlegmon could represent tiny abscesses  DG CHEST PORT 1 VIEW Result Date: 08/10/2021 IMPRESSION: Extensive airspace opacity throughout the left lung field. Comparatively mild opacity within the right lung base. Given the asymmetry, these findings likely reflect multifocal pneumonia. Small left pleural effusion.   EGD: Result Date: 08/10/2021 LA Grade D (one or more mucosal breaks involving at least 75% of esophageal circumference) esophagitis with no bleeding was found in the entire esophagus. Biopsies were taken with a cold forceps for histology. Findings: Hematin (altered blood/coffee-ground-like material) was found  in the gastric body. No gross lesions were noted in the entire examined stomach. Biopsies were taken with a cold forceps for histology. Few non-bleeding superficial duodenal ulcers with no stigmata of bleeding were found in the duodenal bulb. The largest  lesion was 15 mm in largest dimension. Biopsies were taken with a cold forceps for histology. The second portion of the duodenum was normal.  CT HEAD WO CONTRAST (5MM) Result Date: 08/11/2021 IMPRESSION: No acute intracranial process.   DG CHEST PORT 1 VIEW Result Date: 08/11/2021 IMPRESSION: No change from the prior study.      Micro Data:  10/13 - Bcx no growth x5d 10/13 - Ucx no growth 10/18 - resp culture pending   Antimicrobials:  Cefepime 10/13-10/13  Cefazolin 10/13 > Zosyn 10/17-10/17  Vancomycin 10/17-10  Interim History/Subjective:  No events. Remains intubated/sedated on vent.  Objective:  Blood pressure 114/62, pulse 87, temperature 98.4 F (36.9 C), temperature source Oral, resp. rate (!) 22, height 5' 11"  (1.803 m), weight 95.7 kg, SpO2 96 %.    Vent Mode: PRVC FiO2 (%):  [40 %] 40 % Set Rate:  [22 bmp] 22 bmp Vt Set:  [450 mL] 450 mL PEEP:  [8 cmH20] 8 cmH20 Plateau Pressure:  [20 cmH20-24 cmH20] 20 cmH20   Intake/Output Summary (Last 24 hours) at 08/16/2021 0806 Last data filed at 08/16/2021 8841 Gross per 24 hour  Intake 3130.92 ml  Output 583 ml  Net 2547.92 ml    Filed Weights   08/14/21 0322 08/15/21 0552 08/16/21 0500  Weight: 89.9 kg 93.4 kg 95.7 kg    Examination: Sedated on vent Lungs clear to auscultation Flickers eyes to voice but not following commands yet Ext with trace edema L toe ulcer stable  CBC stable Transaminitis a bit better BUN/Cr slowly worsening K okay CBC stable anemia and leukocytosis  Patient Lines/Drains/Airways Status     Active Line/Drains/Airways     Name Placement date Placement time Site Days   Peripheral IV 08/07/21 18 G Left Hand 08/07/21   1534  Hand  9   Peripheral IV 08/12/21 20 G 1" Left;Anterior;Lateral;Proximal Forearm 08/12/21  2102  Forearm  4   PICC Single Lumen 07/31/21 Right Brachial 40 cm 0 cm 07/31/21  1034  Brachial  16   Urethral Catheter Meghan, RN Straight-tip 16 Fr. 08/14/21  1428  Straight-tip  2   Airway 8 mm 08/12/21  1253  -- 4   Incision (Closed) 08/07/21 Back 08/07/21  1803  -- 9   Small Bore Feeding Tube 10 Fr. Left nare Marking at nare/corner of mouth 69 cm 08/12/21  1133  Left nare  4   Pressure Injury 08/10/21 Coccyx Medial Stage 2 -  Partial thickness loss of dermis presenting as a shallow open injury with a red, pink wound bed without slough. 08/10/21  1700  -- 6   Pressure Injury 08/14/21 Buttocks Bilateral Deep Tissue Pressure Injury - Purple or maroon localized area of discolored intact skin or blood-filled blister due to damage of underlying soft tissue from pressure and/or shear. DTI 08/14/21  0800  -- 2   Wound / Incision (Open or Dehisced) 07/22/21 Diabetic ulcer Toe (Comment  which one) Left Big toe ulcer 07/22/21  --  Toe (Comment  which one)  25             Resolved Hospital Problem list     Assessment & Plan:   Acute hypoxic respiratory failure due to splinting, likely aspiration with severe disease in left lung; intubated 10/19; improving MSSA osteomyelitis of T4/5- with spinal cord bompression s/p T4/5 laminectomy, left T5 pediculotomy to decompress spinal cord; postop course complicated by severe pain COVID positive with infection over a month ago ABLA from candidal esophagitis  with hematemesis, duodenal ulcer- stable H/H after transfusion 10/21 DM2 with hyperglycemia Opiate and immobility induced severe constipation Worsening AKI/Septic ATN on top of CKD3a Shock liver- stable, nothing on CT A/P a few days ago  - Vent support, VAP prevention bundle - Unasyn with duration TBD - Carafate and PPI as ordered - Fluconazole with duration TBD, need to keep eye on liver indices -  Sedation titrated to vent synchrony - Continue basal/bolus insulin - Continue bowel regimen - Will need multimodal pain regimen including PCA on extubation - SAT/SBT today - No immediate need for HD but may be heading there, would continue supportive care; if not waking up in AM we will probably need to start - Wife updated by phone and emotional support provided  Best Practice:  Diet: tubefeeds Pain/Anxiety/Delirium protocol (if indicated): see orders VAP protocol (if indicated): yes DVT prophylaxis: challenge with heparin ppx today GI prophylaxis: PPI Glucose control: SSI Yes and Basal insulin Yes Lines: N/A Foley: yes needed for retention Mobility: bed rest, Code Status: full code Family Communication: will update Disposition: ICU  34 min cc time Erskine Emery MD PCCM

## 2021-08-16 NOTE — Progress Notes (Signed)
Daily Progress Note   Patient Name: Jose [name]       Date: 08/16/2021 DOB: [birth year]  Age: 45 y.o. MRN#: [id number] Attending Physician: Candee Furbish, MD Primary Care Physician: [name], NP Admit Date: 08/06/2021  Reason for Consultation/Follow-up: Goals/psychosocial support  Subjective: I saw and examined Jose [name] today.  He did not arouse to verbal or tactile stimulation.  I called and spoke with his wife, Jose [name].  She reports discussing with Dr. Tamala Julian and tells me that Dr. Tamala Julian has been doing a good job making sure that she is understanding and comfortable with plan.  She is appreciative of staff ensuring that she is up-to-date with plan and answering her questions.  We discussed concerns regarding renal function and plan to continue to monitor how he does clinically on a daily basis.  Discussed that sedation was turned off today to see how well he wakes up in hopes he can be extubated in the next few days.  She expressed understanding how critically ill he is but it is hopeful he will have some stabilization and improvement.  Length of Stay: 10  Current Medications: Scheduled Meds:  . sodium chloride   Intravenous Once  . amLODipine  10 mg Per Tube Daily  . baclofen  5 mg Per Tube TID  . chlorhexidine gluconate (MEDLINE KIT)  15 mL Mouth Rinse BID  . Chlorhexidine Gluconate Cloth  6 each Topical Daily  . feeding supplement (PROSource TF)  45 mL Per Tube TID  . fluconazole  200 mg Per Tube Daily  . gabapentin  300 mg Per Tube Q12H  . heparin injection (subcutaneous)  5,000 Units Subcutaneous Q8H  . insulin aspart  0-20 Units Subcutaneous Q4H  . insulin aspart  8 Units Subcutaneous Q4H  . insulin detemir  25 Units Subcutaneous BID  . lactulose  20 g Per Tube TID  .  lidocaine  2 patch Transdermal Q24H  . living well with diabetes book   Does not apply Once  . mouth rinse  15 mL Mouth Rinse 10 times per day  . multivitamin with minerals  1 tablet Per Tube Daily  . mupirocin ointment  1 application Topical BID  . oxyCODONE  10 mg Per Tube Q6H  . pantoprazole sodium  40 mg Per Tube Daily  . polyethylene glycol  17 g Per Tube TID  . senna-docusate  1 tablet Per Tube BID  . sodium chloride flush  10-40 mL Intracatheter Q12H  . sodium chloride flush  3 mL Intravenous Q12H  . sucralfate  1 g Per Tube TID WC & HS  . traZODone  100 mg Per Tube QHS    Continuous Infusions: . sodium chloride 10 mL/hr at 08/16/21 1400  . ampicillin-sulbactam (UNASYN) IV Stopped (08/16/21 1121)  . dexmedetomidine (PRECEDEX) IV infusion 0.4 mcg/kg/hr (08/16/21 1635)  . feeding supplement (VITAL 1.5 CAL) 1,000 mL (08/16/21 0356)  . fentaNYL infusion INTRAVENOUS Stopped (08/16/21 0752)  . promethazine (PHENERGAN) injection (IM or IVPB) 25 mg (08/10/21 0506)  . propofol (DIPRIVAN) infusion Stopped (08/16/21 0752)    PRN Meds: sodium chloride, acetaminophen **OR** acetaminophen, albuterol, bisacodyl, fentaNYL, hydrALAZINE, metoprolol tartrate, midazolam, promethazine (PHENERGAN) injection (IM or IVPB), sodium chloride flush, sodium chloride flush, sodium phosphate  Physical Exam    General: Intubated and sedated  HEENT: No JVD, ET tube in place Heart: Regular rate and rhythm. No murmur appreciated. Lungs: Rales Abdomen: Soft, nontender, nondistended, positive bowel sounds.   Ext: No significant edema Skin: Warm and dry Neuro: Sedated        Vital Signs: BP (!) 147/75   Pulse (!) 109   Temp 98.6 F (37 C) (Oral)   Resp 15   Ht 5' 11"  (1.803 m)   Wt 95.7 kg   SpO2 95%   BMI 29.43 kg/m  SpO2: SpO2: 95 % O2 Device: O2 Device: Ventilator O2 Flow Rate: O2 Flow Rate (L/min): 15 L/min  Intake/output summary:  Intake/Output Summary (Last 24 hours) at 08/16/2021  1831 Last data filed at 08/16/2021 1600 Gross per 24 hour  Intake 1796.62 ml  Output 483 ml  Net 1313.62 ml    LBM: Last BM Date: 08/16/21 Baseline Weight: Weight: 86.2 kg Most recent weight: Weight: 95.7 kg       Palliative Assessment/Data:      Patient Active Problem List   Diagnosis Date Noted  . Osteomyelitis of thoracic spine ([place]) 08/06/2021  . Essential hypertension 08/06/2021  . CKD (chronic kidney disease) stage 3, GFR 30-59 ml/min ([facility]) 08/06/2021  . Pleural effusion, bilateral 08/06/2021  . Diabetic ulcer of left great toe ([place])   . MSSA bacteremia 07/23/2021  . Constipation 07/23/2021  . Lytic bone lesions on xray 07/22/2021  . Anemia 05/19/2021  . Nausea and vomiting 05/19/2021  . Elevated blood pressure reading 05/19/2021  . Bilateral carpal tunnel syndrome 01/23/2020  . Numbness 12/12/2019  . Microalbuminuria due to type 2 diabetes mellitus ([place]) 07/20/2019  . GAD (generalized anxiety disorder) 06/18/2019  . Adult ADHD 06/18/2019  . Trigger middle finger of left hand 06/11/2019  . Diabetic retinopathy ([place]) 07/13/2018  . Type 2 diabetes mellitus with diabetic polyneuropathy, without long-term current use of insulin ([place]) 05/17/2018  . Tobacco use disorder 05/17/2018  . Hyperlipidemia 05/17/2018    Palliative Care Assessment & Plan   Patient Profile: 45 year old male with recent admission for MSSA bacteremia/osteomyelitis now status post T4/T5 laminectomy, T5 pediculectomy, and spinal cord decompression on 1014 who developed GI bleed and hypoxia.  Intubated overnight.  Recommendations/Plan: Full code/full scope Discussed with patient's wife, Jose [name], via phone today.  Reviewed clinical course and ensured her questions were answered regarding care plan moving forward.  Plan at this time remains for aggressive care with hope that he will be able to be extubated in the next few days.  Palliative will continue to follow  peripherally and check in  intermittently for support for Kairi and his wife.  Please call if there are other palliative specific needs with which our team can be of assistance.  Goals of Care and Additional Recommendations: Limitations on Scope of Treatment: Full Scope Treatment  Code Status:    Code Status Orders  (From admission, onward)           Start     Ordered   08/06/21 1536  Full code  Continuous        08/06/21 1537           Code Status History     Date Active Date Inactive Code Status Order ID Comments User Context   07/22/2021 1844 08/03/2021 1712 Full Code [id number]  Terrilee Croak, MD Inpatient   05/19/2021 0418 05/21/2021 1847 Full Code [id number]  Tacey Ruiz, MD Inpatient      Advance Directive Documentation    Flowsheet Row Most Recent Value  Type of Advance Directive Healthcare Power of Attorney  Pre-existing out of facility DNR order (yellow form or pink MOST form) --  "MOST" Form in Place? --       Prognosis:  Unable to determine  Discharge Planning: To Be Determined  Care plan was discussed with wife, Jose [name]  Thank you for allowing the Palliative Medicine Team to assist in the care of this patient.   Total Time 20 Prolonged Time Billed No   Greater than 50%  of this time was spent counseling and coordinating care related to the above assessment and plan.   Micheline Rough, MD  Please contact Palliative Medicine Team phone at [phone] for questions and concerns.

## 2021-08-16 NOTE — Progress Notes (Signed)
Admit: 08/06/2021 LOS: 10  62M AoCKD setting of worsened MSSA OM of thoracic spine requiring surgical decompression, AHRF, Aspiration PNA, long-term exposure to cephalosporins/beta-lactam, ABLA nadir Hb 6.2 on 10/17 with duodenal ulcer and candidal esophagitis, ventilator pendant AHRF  Subjective:  SCr slightly worsened to 4.4, K 4.7, HCo3 21  UOP 0.5L BPs stable Renal US w/o acute structural issues  10/22 0701 - 10/23 0700 In: 2978.3 [I.V.:978.3; NG/GT:1800; IV Piggyback:200] Out: 523 [Urine:523]  Filed Weights   08/14/21 0322 08/15/21 0552 08/16/21 0500  Weight: 89.9 kg 93.4 kg 95.7 kg    Scheduled Meds:  sodium chloride   Intravenous Once   amLODipine  10 mg Per Tube Daily   baclofen  5 mg Per Tube TID   chlorhexidine gluconate (MEDLINE KIT)  15 mL Mouth Rinse BID   Chlorhexidine Gluconate Cloth  6 each Topical Daily   feeding supplement (PROSource TF)  45 mL Per Tube TID   fluconazole  200 mg Per Tube Daily   gabapentin  300 mg Per Tube Q12H   heparin injection (subcutaneous)  5,000 Units Subcutaneous Q8H   insulin aspart  0-20 Units Subcutaneous Q4H   insulin aspart  8 Units Subcutaneous Q4H   insulin detemir  25 Units Subcutaneous BID   lactulose  20 g Per Tube TID   lidocaine  2 patch Transdermal Q24H   living well with diabetes book   Does not apply Once   mouth rinse  15 mL Mouth Rinse 10 times per day   multivitamin with minerals  1 tablet Per Tube Daily   mupirocin ointment  1 application Topical BID   oxyCODONE  10 mg Per Tube Q6H   pantoprazole sodium  40 mg Per Tube Daily   polyethylene glycol  17 g Per Tube TID   senna-docusate  1 tablet Per Tube BID   sodium chloride flush  10-40 mL Intracatheter Q12H   sodium chloride flush  3 mL Intravenous Q12H   sucralfate  1 g Per Tube TID WC & HS   traZODone  100 mg Per Tube QHS   Continuous Infusions:  sodium chloride Stopped (08/13/21 1602)   ampicillin-sulbactam (UNASYN) IV 3 g (08/15/21 2251)    dexmedetomidine (PRECEDEX) IV infusion     feeding supplement (VITAL 1.5 CAL) 1,000 mL (08/16/21 0356)   fentaNYL infusion INTRAVENOUS Stopped (08/16/21 0752)   promethazine (PHENERGAN) injection (IM or IVPB) 25 mg (08/10/21 0506)   propofol (DIPRIVAN) infusion Stopped (08/16/21 0752)   PRN Meds:.sodium chloride, acetaminophen **OR** acetaminophen, albuterol, bisacodyl, fentaNYL, hydrALAZINE, metoprolol tartrate, midazolam, promethazine (PHENERGAN) injection (IM or IVPB), sodium chloride flush, sodium chloride flush, sodium phosphate  Current Labs: reviewed    Physical Exam:  Blood pressure (!) 144/80, pulse 96, temperature 98.3 F (36.8 C), temperature source Oral, resp. rate 14, height _0  (1.803 m), weight 95.7 kg, SpO2 95 %. GEN: Intubated, sedated ENT: ET tube in place EYES: Eyes closed CV: Regular, normal rate, no rub PULM: Coarse breath sounds bilaterally ABD: Soft, no abdominal fullness SKIN: No rashes or lesions EXT: Trace to 1+ lower extremity edema GU: Normal-appearing genitalia, Foley catheter in place  A AoCKD3: BL SCr 1.5 to 2.0. Sees [facility].  Most likely ATN.  UA with some hematuria/pyuria, could have infection related GN.   Renal US 10/21 negative C3 and C4 pending Maybe GFR stabilizing in past 24h?  No RRT indications MSSA Bacteremia / OM of thoracic spine s/p thoracic decompression 10/15, on Unasyn currently 2/2 #5, RCID  following AHRF, ventilator dependent; weening today ABLA, had duodenal ulcer and candidal esophagitis on EGD 10/18; carafate + PPI Aspiration pneumonia, ABX as above Longstanding DM2 with microvascular disease COVID positive off precautions  P No RRT needs, hopefully is leveling off  Will follow long Daily weights, Daily Renal Panel, Strict I/Os, Avoid nephrotoxins (NSAIDs, judicious IV Contrast)  Cont supportive care  Pearson Grippe MD 08/16/2021, 10:31 AM  Recent Labs  Lab 08/14/21 0415 08/14/21 0801 08/14/21 1700  08/15/21 0545 08/16/21 0454  NA  --    < > 137 140 138  K  --    < > 4.8 4.6 4.7  CL  --    < > 105 107 107  CO2  --    < > 21* 22 21*  GLUCOSE  --    < > 365* 225* 139*  BUN  --    < > 85* 97* 107*  CREATININE  --    < > 3.78* 4.23* 4.43*  CALCIUM  --    < > 8.2* 8.3* 8.1*  PHOS 4.2  --  3.7 3.2  --    < > = values in this interval not displayed.    Recent Labs  Lab 08/12/21 0233 08/12/21 1009 08/13/21 0415 08/13/21 1041 08/14/21 0801 08/15/21 0545 08/16/21 0454  WBC 37.2*  --  35.2*  --  26.3* 22.0* 22.0*  NEUTROABS 35.0*  --  30.2*  --  21.3*  --   --   HGB 7.5*   < > 7.1*   < > 8.1* 8.0* 8.6*  HCT 23.2*   < > 21.8*   < > 25.8* 25.4* 27.2*  MCV 87.9  --  89.7  --  90.5 89.4 91.0  PLT 452*  --  412*  --  381 377 381   < > = values in this interval not displayed.

## 2021-08-17 ENCOUNTER — Inpatient Hospital Stay: Payer: 59 | Admitting: Internal Medicine

## 2021-08-17 LAB — CULTURE, BLOOD (ROUTINE X 2)
Culture: NO GROWTH
Culture: NO GROWTH
Special Requests: ADEQUATE

## 2021-08-17 LAB — CBC
HCT: 26.5 % — ABNORMAL LOW (ref 39.0–52.0)
Hemoglobin: 8.3 g/dL — ABNORMAL LOW (ref 13.0–17.0)
MCH: 28.4 pg (ref 26.0–34.0)
MCHC: 31.3 g/dL (ref 30.0–36.0)
MCV: 90.8 fL (ref 80.0–100.0)
Platelets: 352 10*3/uL (ref 150–400)
RBC: 2.92 MIL/uL — ABNORMAL LOW (ref 4.22–5.81)
RDW: 15.6 % — ABNORMAL HIGH (ref 11.5–15.5)
WBC: 21.4 10*3/uL — ABNORMAL HIGH (ref 4.0–10.5)
nRBC: 0 % (ref 0.0–0.2)

## 2021-08-17 LAB — GLUCOSE, CAPILLARY
Glucose-Capillary: 108 mg/dL — ABNORMAL HIGH (ref 70–99)
Glucose-Capillary: 124 mg/dL — ABNORMAL HIGH (ref 70–99)
Glucose-Capillary: 215 mg/dL — ABNORMAL HIGH (ref 70–99)
Glucose-Capillary: 225 mg/dL — ABNORMAL HIGH (ref 70–99)
Glucose-Capillary: 257 mg/dL — ABNORMAL HIGH (ref 70–99)
Glucose-Capillary: 262 mg/dL — ABNORMAL HIGH (ref 70–99)

## 2021-08-17 LAB — COMPREHENSIVE METABOLIC PANEL
ALT: 114 U/L — ABNORMAL HIGH (ref 0–44)
AST: 155 U/L — ABNORMAL HIGH (ref 15–41)
Albumin: <1.5 g/dL — ABNORMAL LOW (ref 3.5–5.0)
Alkaline Phosphatase: 715 U/L — ABNORMAL HIGH (ref 38–126)
Anion gap: 13 (ref 5–15)
BUN: 113 mg/dL — ABNORMAL HIGH (ref 6–20)
CO2: 21 mmol/L — ABNORMAL LOW (ref 22–32)
Calcium: 8.4 mg/dL — ABNORMAL LOW (ref 8.9–10.3)
Chloride: 107 mmol/L (ref 98–111)
Creatinine, Ser: 4.53 mg/dL — ABNORMAL HIGH (ref 0.61–1.24)
GFR, Estimated: 15 mL/min — ABNORMAL LOW (ref >60)
Glucose, Bld: 125 mg/dL — ABNORMAL HIGH (ref 70–99)
Potassium: 5 mmol/L (ref 3.5–5.1)
Sodium: 141 mmol/L (ref 135–145)
Total Bilirubin: 2.8 mg/dL — ABNORMAL HIGH (ref 0.3–1.2)
Total Protein: 5.9 g/dL — ABNORMAL LOW (ref 6.5–8.1)

## 2021-08-17 LAB — BODY FLUID CULTURE W GRAM STAIN: Culture: NO GROWTH

## 2021-08-17 LAB — TRIGLYCERIDES: Triglycerides: 174 mg/dL — ABNORMAL HIGH (ref <150)

## 2021-08-17 MED ORDER — LORAZEPAM 0.5 MG PO TABS
0.2500 mg | ORAL_TABLET | Freq: Two times a day (BID) | ORAL | Status: DC | PRN
  Administered 2021-08-17: 0.25 mg via ORAL
  Filled 2021-08-17: qty 1

## 2021-08-17 MED ORDER — INSULIN ASPART 100 UNIT/ML IJ SOLN
8.0000 [IU] | INTRAMUSCULAR | Status: DC
  Administered 2021-08-17 – 2021-08-27 (×46): 8 [IU] via SUBCUTANEOUS

## 2021-08-17 MED ORDER — SENNOSIDES-DOCUSATE SODIUM 8.6-50 MG PO TABS: 1.0000 | ORAL_TABLET | Freq: Every day | ORAL | Status: DC

## 2021-08-17 MED ORDER — SENNOSIDES-DOCUSATE SODIUM 8.6-50 MG PO TABS: 2.0000 | ORAL_TABLET | Freq: Every evening | ORAL | Status: DC | PRN

## 2021-08-17 MED ORDER — METHOCARBAMOL 500 MG PO TABS
1000.0000 mg | ORAL_TABLET | Freq: Three times a day (TID) | ORAL | Status: DC
  Administered 2021-08-17 (×2): 1000 mg via ORAL
  Filled 2021-08-17 (×2): qty 2

## 2021-08-17 MED ORDER — LORAZEPAM 0.5 MG PO TABS: 0.5000 mg | ORAL_TABLET | Freq: Four times a day (QID) | ORAL | Status: DC | PRN

## 2021-08-17 MED ORDER — LIP MEDEX EX OINT
TOPICAL_OINTMENT | CUTANEOUS | Status: DC | PRN
  Administered 2021-08-17: 75 via TOPICAL
  Filled 2021-08-17: qty 7

## 2021-08-17 MED ORDER — ALBUMIN HUMAN 25 % IV SOLN
25.0000 g | Freq: Four times a day (QID) | INTRAVENOUS | Status: AC
Start: 2021-08-17 — End: 2021-08-18
  Administered 2021-08-17 – 2021-08-18 (×4): 25 g via INTRAVENOUS
  Filled 2021-08-17 (×4): qty 100

## 2021-08-17 MED ORDER — LACTULOSE 10 GM/15ML PO SOLN
20.0000 g | Freq: Every day | ORAL | Status: DC
  Administered 2021-08-17 – 2021-08-18 (×2): 20 g
  Filled 2021-08-17 (×2): qty 30

## 2021-08-17 MED ORDER — INSULIN ASPART 100 UNIT/ML IJ SOLN
4.0000 [IU] | INTRAMUSCULAR | Status: DC
  Administered 2021-08-17 (×2): 4 [IU] via SUBCUTANEOUS

## 2021-08-17 MED ORDER — DARBEPOETIN ALFA 150 MCG/0.3ML IJ SOSY
150.0000 ug | PREFILLED_SYRINGE | INTRAMUSCULAR | Status: DC
  Administered 2021-08-17 – 2021-08-24 (×2): 150 ug via SUBCUTANEOUS
  Filled 2021-08-17 (×2): qty 0.3

## 2021-08-17 MED ORDER — ONDANSETRON HCL 4 MG/2ML IJ SOLN
4.0000 mg | Freq: Four times a day (QID) | INTRAMUSCULAR | Status: DC | PRN
  Administered 2021-08-17 – 2021-08-28 (×7): 4 mg via INTRAVENOUS
  Filled 2021-08-17 (×8): qty 2

## 2021-08-17 MED ORDER — DULOXETINE HCL 30 MG PO CPEP: 30.0000 mg | ORAL_CAPSULE | Freq: Every day | ORAL | Status: DC

## 2021-08-17 MED ORDER — CEFAZOLIN SODIUM-DEXTROSE 2-4 GM/100ML-% IV SOLN
2.0000 g | Freq: Two times a day (BID) | INTRAVENOUS | Status: DC
  Administered 2021-08-17 – 2021-08-18 (×3): 2 g via INTRAVENOUS
  Filled 2021-08-17 (×5): qty 100

## 2021-08-17 MED ORDER — INSULIN GLARGINE-YFGN 100 UNIT/ML ~~LOC~~ SOLN
10.0000 [IU] | Freq: Every day | SUBCUTANEOUS | Status: DC
  Administered 2021-08-17: 10 [IU] via SUBCUTANEOUS
  Filled 2021-08-17 (×2): qty 0.1

## 2021-08-17 MED ORDER — VENLAFAXINE HCL 37.5 MG PO TABS
37.5000 mg | ORAL_TABLET | Freq: Every day | ORAL | Status: DC
  Administered 2021-08-18: 37.5 mg
  Filled 2021-08-17 (×2): qty 1

## 2021-08-17 MED ORDER — POLYETHYLENE GLYCOL 3350 17 G PO PACK: 17.0000 g | PACK | Freq: Every day | ORAL | Status: DC | PRN

## 2021-08-17 MED ORDER — HYDROXYZINE HCL 25 MG PO TABS
12.5000 mg | ORAL_TABLET | Freq: Three times a day (TID) | ORAL | Status: DC | PRN
  Administered 2021-08-20: 12.5 mg
  Filled 2021-08-17 (×2): qty 1

## 2021-08-17 MED ORDER — GABAPENTIN 250 MG/5ML PO SOLN
200.0000 mg | Freq: Two times a day (BID) | ORAL | Status: DC
  Administered 2021-08-17 – 2021-08-18 (×3): 200 mg
  Filled 2021-08-17 (×4): qty 4

## 2021-08-17 MED ORDER — POLYETHYLENE GLYCOL 3350 17 G PO PACK: 17.0000 g | PACK | Freq: Every day | ORAL | Status: DC

## 2021-08-17 MED ORDER — ORAL CARE MOUTH RINSE
15.0000 mL | Freq: Two times a day (BID) | OROMUCOSAL | Status: DC
  Administered 2021-08-17 – 2021-08-30 (×27): 15 mL via OROMUCOSAL

## 2021-08-17 MED ORDER — METHOCARBAMOL 500 MG PO TABS
1000.0000 mg | ORAL_TABLET | Freq: Three times a day (TID) | ORAL | Status: DC
  Administered 2021-08-17 – 2021-08-18 (×4): 1000 mg
  Filled 2021-08-17 (×4): qty 2

## 2021-08-17 MED ORDER — LORAZEPAM 0.5 MG PO TABS
0.5000 mg | ORAL_TABLET | Freq: Four times a day (QID) | ORAL | Status: DC | PRN
  Administered 2021-08-17 – 2021-08-19 (×4): 0.5 mg
  Filled 2021-08-17 (×4): qty 1

## 2021-08-17 MED ORDER — FENTANYL CITRATE (PF) 100 MCG/2ML IJ SOLN
25.0000 ug | INTRAMUSCULAR | Status: DC | PRN
  Administered 2021-08-20 (×2): 50 ug via INTRAVENOUS
  Administered 2021-08-22: 25 ug via INTRAVENOUS
  Administered 2021-08-23 – 2021-08-24 (×6): 50 ug via INTRAVENOUS
  Filled 2021-08-17 (×9): qty 2

## 2021-08-17 NOTE — Progress Notes (Signed)
Occupational Therapy Treatment Patient Details Name: Jose [name] MRN: [id number] DOB: 12/29/1975 Today's Date: 08/17/2021   History of present illness 45 y.o. male admitted on 08/06/2021 with worsening back pain, S/P T4-5 laminectomy, LT5 pediculotomy, and decompression of spinal cord on 10/14.  Pt with acute hypoxic respiratory failure in setting of recent GI bleed and recent spinal surgery with extensive pain that is impeding his ability to take deep breaths. Endoscopy 10/17. Intubated 10/19-10/24. PMhx: T2DM, retinopathy, neuropathy, HTN, HLD, CKD, covid July 2022   OT comments  Patient supine in bed, engaged in OT/PT session.  Completed bed level suction oral care with mod assist due to weakness and cognition (problem solving and sustained attention). Requires total assist for bed mobility and max assist to sit EOB, unable to engage functionally in ADL at EOB due to impaired balance.  He fatigues easily, but VSS on 6L throughout session.  Will follow acutely.    Recommendations for follow up therapy are one component of a multi-disciplinary discharge planning process, led by the attending physician.  Recommendations may be updated based on patient status, additional functional criteria and insurance authorization.    Follow Up Recommendations  Acute inpatient rehab (3hours/day)    Assistance Recommended at Discharge Frequent or constant Supervision/Assistance  Equipment Recommendations  Other (comment) (TBD)    Recommendations for Other Services Rehab consult    Precautions / Restrictions Precautions Precautions: Fall;Back Precaution Comments: cortrak Restrictions Weight Bearing Restrictions: No       Mobility Bed Mobility Overal bed mobility: Needs Assistance Bed Mobility: Rolling;Sidelying to Sit;Sit to Sidelying Rolling: Max assist Sidelying to sit: Total assist;+2 for physical assistance     Sit to sidelying: Total assist;+2 for physical assistance General bed  mobility comments: physical assist with pad to roll to left to rise to sitting with physical assist to move legs off of bed and elevate trunk as well as return to sidelying. Pt with physical assist for propping on bil forearms EOB. Total +2 to slide toward [facility]    Transfers                   General transfer comment: deferred     Balance Overall balance assessment: Needs assistance Sitting-balance support: Feet supported;No upper extremity supported;Bilateral upper extremity supported Sitting balance-Leahy Scale: Zero Sitting balance - Comments: pt with max assist to control balance with LOB in all directions with multimodal cues                                   ADL either performed or assessed with clinical judgement   ADL Overall ADL's : Needs assistance/impaired     Grooming: Moderate assistance;Bed level   Upper Body Bathing: Maximal assistance;Sitting   Lower Body Bathing: Total assistance;Bed level   Upper Body Dressing : Maximal assistance;Sitting   Lower Body Dressing: Total assistance;Bed level               Functional mobility during ADLs: Total assistance;+2 for physical assistance;+2 for safety/equipment       Vision       Perception     Praxis      Cognition Arousal/Alertness: Awake/alert Behavior During Therapy: Flat affect Overall Cognitive Status: Impaired/Different from baseline Area of Impairment: Attention;Memory;Following commands;Safety/judgement;Problem solving;Orientation;Awareness                 Orientation Level: Disoriented to;Time Current Attention Level: Focused Memory: Decreased short-term memory  Following Commands: Follows one step commands with increased time;Follows one step commands inconsistently Safety/Judgement: Decreased awareness of safety;Decreased awareness of deficits Awareness: Emergent Problem Solving: Slow processing;Decreased initiation;Difficulty sequencing;Requires verbal  cues;Requires tactile cues General Comments: patient with flat affect and lethargic from medication, but follows most commands with increased time and mulitmodal cueing, decreased attention and awareness.          Exercises General Exercises - Lower Extremity Short Arc Quad: AROM;Both;Seated;10 reps Heel Slides: AROM;Both;Supine;10 reps   Shoulder Instructions       General Comments on 6L [place], VSS    Pertinent Vitals/ Pain       Pain Assessment: No/denies pain  Home Living                                          Prior Functioning/Environment              Frequency  Min 2X/week        Progress Toward Goals  OT Goals(current goals can now be found in the care plan section)  Progress towards OT goals: Progressing toward goals  Acute Rehab OT Goals OT Goal Formulation: With patient  Plan Discharge plan remains appropriate;Frequency remains appropriate    Co-evaluation    PT/OT/SLP Co-Evaluation/Treatment: Yes Reason for Co-Treatment: Complexity of the patient's impairments (multi-system involvement);For patient/therapist safety;To address functional/ADL transfers;Necessary to address cognition/behavior during functional activity PT goals addressed during session: Mobility/safety with mobility;Balance;Strengthening/ROM OT goals addressed during session: ADL's and self-care      AM-PAC OT "6 Clicks" Daily Activity     Outcome Measure   Help from another person eating meals?: Total Help from another person taking care of personal grooming?: A Lot Help from another person toileting, which includes using toliet, bedpan, or urinal?: Total Help from another person bathing (including washing, rinsing, drying)?: A Lot Help from another person to put on and taking off regular upper body clothing?: A Lot Help from another person to put on and taking off regular lower body clothing?: Total 6 Click Score: 9    End of Session Equipment Utilized During  Treatment: Oxygen  OT Visit Diagnosis: Other abnormalities of gait and mobility (R26.89);Muscle weakness (generalized) (M62.81);Pain   Activity Tolerance Patient tolerated treatment well   Patient Left in bed;with call bell/phone within reach;with bed alarm set   Nurse Communication Mobility status        Time: 1200-1229 OT Time Calculation (min): 29 min  Charges: OT General Charges $OT Visit: 1 Visit OT Treatments $Self Care/Home Management : 8-22 mins  Jolaine Artist, OT [place] Pager [phone] Office [place] 08/17/2021, 1:22 PM

## 2021-08-17 NOTE — Progress Notes (Addendum)
08/17/2021 I saw and evaluated the patient. Discussed with resident and agree with resident's findings and plan as documented in the resident's note.  S:  Finally waking up. Still oliguric. On PS. Wife at bedside.  O: Blood pressure (!) 166/73, pulse (!) 103, temperature 98.4 F (36.9 C), temperature source Oral, resp. rate 18, height 5' 11"  (1.803 m), weight 97.7 kg, SpO2 99 %.  No distress Moves all 4 ext to command Lungs with scattered rhonci Ext warm, mildly tachycardic Dark urine in foley bag  Cr maybe plateau'd? K 5 WBC stable H/H stable  Assessment and plan as below. Extubation trial; continue abx, hopefully we can avoid HD Would do PCA + nonopiate ancillary meds PRN Wife updated  Patient critically ill due to respiratory failure Interventions to address this today vent wean Risk of deterioration without these interventions is high  I personally spent 33 minutes providing critical care not including any separately billable procedures  Jose [name]  [facility] epic messenger for cross cover needs If after hours, please call E-link     NAME:  Jose [name], MRN:  [id number], DOB:  1976/05/05, LOS: 20 ADMISSION DATE:  08/06/2021, CONSULTATION DATE:  08/10/2021 REFERRING [name]:  TRH, CHIEF COMPLAINT:  Hypoxia   Brief History   Jose [name] is a 45 y.o. 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 On 10/17 patient developed coffee-ground emesis, now s/p EGD showing circumferential esophagitis with hemorrhage and non-bleeding duodenal ulcers now on BID PPI.  On 10/17, pccm was consulted for acute hypoxia.   Past Medical History   has a past medical history of GAD (generalized anxiety disorder), Hyperlipidemia, Macular degeneration, bilateral, Retinopathy, and Type II diabetes mellitus with complication, uncontrolled.  Significant Hospital Events   10/13 -admitted with worsening thoracic osteomyelitis, constipation/abd distention, neurosurgery consulted  10/14 - T4-T5 laminectomy, left T5 pediculotomy, and spinal cord decompression 10/17 - Hgb drop to 6.2, coffee ground emesis s/p EGD with esophagitis and peptic ulcer 10/17 - hypoxia and pccm consult  10/19 intubated 10/21 developed worsening kidney function, nephro consulted.  10/24 extubated  Consults:  Neurosurgery Infection disease PCCM  Procedures:  10/14 - T4-T5 laminectomy, left T5 pediculotomy, and spinal cord decompression 10/17 - EGD 10/19 intubation  Significant Diagnostic Tests:  CT Abd/Pelvis Result Date: 08/06/2021 IMPRESSION: 1. Generally large burden of stool and stool balls throughout the colon. 2. Distended urinary bladder, measuring up to 19.0 cm. Correlate for urinary retention. 3. Anasarca.  CTA: Result Date: 08/06/2021 1. Negative examination for pulmonary embolism. 2. Moderate bilateral pleural effusions and associated atelectasis or consolidation, new compared to prior examination. 3. There is substantial interval worsening of destructive lytic change about the T4-T5 disc space. Overlying paravertebral soft tissue thickening. Findings are consistent with worsened discitis osteomyelitis but in general better assessed by same-day MR. 4. Unchanged enlarged right hilar and subcarinal lymph nodes, likely reactive. 5. Cardiomegaly and coronary artery disease.  MR Thoracic Spine: Result Date: 08/06/2021 1. Redemonstrated abnormal bone marrow signal and enhancement involving the T4 and T5 levels with increased surrounding paraspinal phlegmon and new epidural  phlegmon (6 mm thick ventrally). Interval T5 pathologic fracture (50-60% height loss) with new 4  mm of bony retropulsion. Constellation of findings results in new moderate to severe left eccentric canal stenosis at T4 with cord deformity. 2. Overall, findings are most likely secondary to progressive discitis/osteomyelitis. Superimposed osseous metastatic disease or multiple myeloma is not excluded by imaging. Recommend correlation with the results of reported recent biopsy. 3. Multiple small (subcentimeter) fluid collections in the left posterior paraspinal phlegmon could represent tiny abscesses  DG CHEST PORT 1 VIEW Result Date: 08/10/2021 IMPRESSION: Extensive airspace opacity throughout the left lung field. Comparatively mild opacity within the right lung base. Given the asymmetry, these findings likely reflect multifocal pneumonia. Small left pleural effusion.   EGD: Result Date: 08/10/2021 LA Grade D (one or more mucosal breaks involving at least 75% of esophageal circumference) esophagitis with no bleeding was found in the entire esophagus. Biopsies were taken with a cold forceps for histology. Findings: Hematin (altered blood/coffee-ground-like material) was found in the gastric body. No gross lesions were noted in the entire examined stomach. Biopsies were taken with a cold forceps for histology. Few non-bleeding superficial duodenal ulcers with no stigmata of bleeding were found in the duodenal bulb. The largest lesion was 15 mm in largest dimension. Biopsies were taken with a cold forceps for histology. The second portion of the duodenum was normal.  CT HEAD WO CONTRAST (5MM) Result Date: 08/11/2021 IMPRESSION: No acute intracranial process.   DG CHEST PORT 1 VIEW Result Date: 08/11/2021 IMPRESSION: No change from the prior study.      Micro Data:  10/13 - Bcx no growth x5d 10/13 - Ucx no growth 10/18 - resp culture negative 10/21 - pleural fluid no growth at  2d   Antimicrobials:  Cefepime 10/13-10/13  Cefazolin 10/13 > Zosyn 10/17-10/17  Vancomycin 10/17-10  Interim History/Subjective:  No events. Remains intubated/sedated on vent.  Objective:  Blood pressure 116/67, pulse 70, temperature 98.2 F (36.8 C), temperature source Oral, resp. rate 16, height 5' 11"  (1.803 m), weight 97.7 kg, SpO2 99 %.    Vent Mode: PRVC FiO2 (%):  [40 %] 40 % Set Rate:  [15 bmp-16 bmp] 16 bmp Vt Set:  [600 mL] 600 mL PEEP:  [8 cmH20] 8 cmH20 Pressure Support:  [5 cmH20] 5 cmH20 Plateau Pressure:  [20 cmH20-21 cmH20] 21 cmH20   Intake/Output Summary (Last 24 hours) at 08/17/2021 0612 Last data filed at 08/17/2021 0400 Gross per 24 hour  Intake 1340.11 ml  Output 645 ml  Net 695.11 ml   Filed Weights   08/15/21 0552 08/16/21 0500 08/17/21 0500  Weight: 93.4 kg 95.7 kg 97.7 kg    Examination: Physical Exam Constitutional:      Appearance: He is ill-appearing.  HENT:     Head: Normocephalic and atraumatic.     Mouth/Throat:     Mouth: Mucous membranes are moist.  Eyes:     Extraocular Movements: Extraocular movements intact.     Pupils: Pupils are equal, round, and reactive to light.  Cardiovascular:     Rate and Rhythm: Normal rate.     Pulses: Normal pulses.     Heart sounds: No murmur heard. Pulmonary:     Effort: Pulmonary effort is normal.     Breath sounds: Normal breath sounds.  Abdominal:     General: Bowel sounds are normal. There is distension.     Tenderness: There is no abdominal tenderness.  Musculoskeletal:        General: No swelling. Normal range of motion.     Cervical back: No rigidity.  Lymphadenopathy:  Cervical: No cervical adenopathy.  Skin:    General: Skin is warm and dry.  Neurological:     General: No focal deficit present.     Mental Status: He is alert.      Patient Lines/Drains/Airways Status     Active Line/Drains/Airways     Name Placement date Placement time Site Days   Peripheral IV  08/07/21 18 G Left Hand 08/07/21  1534  Hand  9   Peripheral IV 08/12/21 20 G 1" Left;Anterior;Lateral;Proximal Forearm 08/12/21  2102  Forearm  4   PICC Single Lumen 07/31/21 Right Brachial 40 cm 0 cm 07/31/21  1034  Brachial  16   Urethral Catheter Meghan, Jose [name] Straight-tip 16 Fr. 08/14/21  1428  Straight-tip  2   Airway 8 mm 08/12/21  1253  -- 4   Incision (Closed) 08/07/21 Back 08/07/21  1803  -- 9   Small Bore Feeding Tube 10 Fr. Left nare Marking at nare/corner of mouth 69 cm 08/12/21  1133  Left nare  4   Pressure Injury 08/10/21 Coccyx Medial Stage 2 -  Partial thickness loss of dermis presenting as a shallow open injury with a red, pink wound bed without slough. 08/10/21  1700  -- 6   Pressure Injury 08/14/21 Buttocks Bilateral Deep Tissue Pressure Injury - Purple or maroon localized area of discolored intact skin or blood-filled blister due to damage of underlying soft tissue from pressure and/or shear. 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 Shock liver- stable, nothing on CT A/P a few days ago  - Vent support, VAP  prevention bundle - Unasyn with duration TBD - Carafate and PPI as ordered - Fluconazole for candidal esophagitis with duration TBD, need to keep eye on liver indices - Continue basal/bolus insulin, decreased today - Continue bowel regimen> good BM will decrease today - Will need multimodal pain regimen including PCA on extubation - SAT/SBT today> Extubated today, sup O2 as needed - No immediate need for HD but may be heading there, would continue supportive care - Wife updated by phone and emotional support provided  Best Practice:  Diet: tubefeeds Pain/Anxiety/Delirium protocol (if indicated): see orders VAP protocol (if indicated): yes DVT prophylaxis: challenge with heparin ppx today GI prophylaxis: PPI Glucose control: SSI Yes and Basal insulin Yes Lines: N/A Foley: yes needed for retention Mobility: bed rest, Code Status: full code Family Communication: will update Disposition: ICU  Jose [name], D.O.  Internal Medicine Resident, PGY-3 Jose [name] Internal [facility] Res[facility]  Pager: [phone] 8:51 AM, 08/17/2021

## 2021-08-17 NOTE — Progress Notes (Signed)
  Inpatient Rehab Admissions Coordinator :  Per therapy recommendations patient was screened for CIR candidacy by Danne Baxter RN MSN. Patient is not yet at a level to tolerate the intensity required to pursue a CIR admit due to lethargy and cognition. Patient may have the potential to progress to become a candidate. The CIR admissions team will follow and monitor for progress and place a Rehab Consult order if felt to be appropriate. Please contact me with any questions.  Danne Baxter RN MSN Admissions Coordinator [phone]

## 2021-08-17 NOTE — Progress Notes (Signed)
[facility]ious Disease    Date of Admission:  08/06/2021   Total days of antibiotics 12/day 7 unasyn           ID: Jose [name] is a 45 y.o. male with MSSA thoracic OM s/p T4-5 laminectomy on 10/14. Complicated by respiratory distress requiring ventilator s/p extubation, now some delirium. Also AKI.Treated also for aspiration pneumonia Principal Problem:   Osteomyelitis of thoracic spine (HCC) Active Problems:   Type 2 diabetes mellitus with diabetic polyneuropathy, without long-term current use of insulin (HCC)   Hyperlipidemia   GAD (generalized anxiety disorder)   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)   Essential hypertension   CKD (chronic kidney disease) stage 3, GFR 30-59 ml/min (HCC)   Pleural effusion, bilateral    Subjective: On nasal cannula. Slow to answer question, sedated. Increased urine output  Medications:   sodium chloride   Intravenous Once   amLODipine  10 mg Per Tube Daily   Chlorhexidine Gluconate Cloth  6 each Topical Daily   darbepoetin (ARANESP) injection - NON-DIALYSIS  150 mcg Subcutaneous Q Mon-1800   DULoxetine  30 mg Oral Daily   feeding supplement (PROSource TF)  45 mL Per Tube TID   fluconazole  200 mg Per Tube Daily   gabapentin  200 mg Per Tube Q12H   heparin injection (subcutaneous)  5,000 Units Subcutaneous Q8H   insulin aspart  0-20 Units Subcutaneous Q4H   insulin aspart  4 Units Subcutaneous Q4H   lactulose  20 g Per Tube Daily   lidocaine  2 patch Transdermal Q24H   living well with diabetes book   Does not apply Once   mouth rinse  15 mL Mouth Rinse BID   methocarbamol  1,000 mg Oral TID   multivitamin with minerals  1 tablet Per Tube Daily   mupirocin ointment  1 application Topical BID   oxyCODONE  10 mg Per Tube Q6H   pantoprazole sodium  40 mg Per Tube Daily   sodium chloride flush  10-40 mL Intracatheter Q12H   sodium chloride flush  3 mL Intravenous Q12H   sucralfate  1 g Per Tube TID WC & HS    traZODone  100 mg Per Tube QHS    Objective: Vital signs in last 24 hours: Temp:  [98.2 F (36.8 C)-98.6 F (37 C)] 98.5 F (36.9 C) (10/24 1122) Pulse Rate:  [70-115] 112 (10/24 1200) Resp:  [11-26] 23 (10/24 1200) BP: (90-170)/(57-79) 146/71 (10/24 1200) SpO2:  [90 %-100 %] 94 % (10/24 1232) FiO2 (%):  [40 %] 40 % (10/24 0806) Weight:  [97.7 kg] 97.7 kg (10/24 0500) Physical Exam  Constitutional: He is oriented to person, place, and time. He appears well-developed and well-nourished. No distress.  HENT:  Mouth/Throat: Oropharynx is clear and moist. No oropharyngeal exudate.  Cardiovascular: Normal rate, regular rhythm and normal heart sounds. Exam reveals no gallop and no friction rub.  No murmur heard.  Pulmonary/Chest: Effort normal and breath sounds normal. No respiratory distress. He has no wheezes.  Abdominal: Soft. Bowel sounds are normal. He exhibits no distension. There is no tenderness.  Lymphadenopathy:  He has no cervical adenopathy.  Neurological: He is alert and oriented to person, place, and time.  Skin: Skin is warm and dry. No rash noted. No erythema.  Psychiatric: He has a normal mood and affect. His behavior is normal.    Lab Results Recent Labs    08/16/21 0454 08/17/21 0400  WBC  22.0* 21.4*  HGB 8.6* 8.3*  HCT 27.2* 26.5*  NA 138 141  K 4.7 5.0  CL 107 107  CO2 21* 21*  BUN 107* 113*  CREATININE 4.43* 4.53*   Liver Panel Recent Labs    08/16/21 0454 08/17/21 0400  PROT 5.5* 5.9*  ALBUMIN <1.5* <1.5*  AST 253* 155*  ALT 157* 114*  ALKPHOS 836* 715*  BILITOT 2.3* 2.8*   Sedimentation Rate No results for input(s): ESRSEDRATE in the last 72 hours. C-Reactive Protein No results for input(s): CRP in the last 72 hours.  Microbiology: reviewed Studies/Results: No results found.   Assessment/Plan: MSSA thoracic spine osteomyelitis = continue with IV abtx. Recommend to change from amp/sub to cefazolin. Change of abtx will also decrease  volume intake  Aspiration pneumonia = finishes course of treatment today with unasyn.  Volume overload= continues to improve urine output/diuresis.   [facility] Pager: [phone]  08/17/2021, 1:27 PM

## 2021-08-17 NOTE — Progress Notes (Signed)
Subjective:  Extubated this AM-  645 of UOP but BUN and crt rising slightly from yest-  BUN up to 113 Objective Vital signs in last 24 hours: Vitals:   08/17/21 0806 08/17/21 0828 08/17/21 0830 08/17/21 0915  BP: (!) 162/79 (!) 170/75 (!) 166/73   Pulse: 100  (!) 103 (!) 115  Resp: (!) $RemoveB'21  18 20  'LMYyhaim$ Temp:      TempSrc:      SpO2: 100%  99% 95%  Weight:      Height:       Weight change: 2 kg  Intake/Output Summary (Last 24 hours) at 08/17/2021 1610 Last data filed at 08/17/2021 0900 Gross per 24 hour  Intake 1357.88 ml  Output 1000 ml  Net 357.88 ml    Assessment/ Plan: Pt is a 45 y.o. yo male CKD ( crt 1.5-2) , DM, HTN who was admitted on 08/06/2021 with MSSA bacteremia/osteomyelitis- s/p T4/5 laminectomy, pediculetomy and spinal cord decompression-  complicating A on CRF Assessment/Plan: 1. Renal-  baseline crt 1.5-2.  Now with A on CRF in the setting of above-  non oliguric and UOP increasing but also having rising BUN and crt daily although rate of rise seems to be slowing.  No absolute need for HD but are getting close -- bun would be only indication and since clinically improving will not do anything drastic today -  wife aware of issues 2. HTN/vol-  overloaded but UOP improving, not hypoxic-  will not do diuretics yet  3. Anemia-  hgb low but stable-  will add on ESA-  no iron due to osteo  4. Hyperkalemia-  supportive treatment for now- will consider bicarb tomorrow if not better-  dont want to give more volume if can help it - also could use lasix as mechanism to treat high K if needed   [facility]: Basic Metabolic Panel: Recent Labs  Lab 08/14/21 0415 08/14/21 0801 08/14/21 1700 08/15/21 0545 08/16/21 0454 08/17/21 0400  NA  --    < > 137 140 138 141  K  --    < > 4.8 4.6 4.7 5.0  CL  --    < > 105 107 107 107  CO2  --    < > 21* 22 21* 21*  GLUCOSE  --    < > 365* 225* 139* 125*  BUN  --    < > 85* 97* 107* 113*  CREATININE  --    < >  3.78* 4.23* 4.43* 4.53*  CALCIUM  --    < > 8.2* 8.3* 8.1* 8.4*  PHOS 4.2  --  3.7 3.2  --   --    < > = values in this interval not displayed.   Liver Function Tests: Recent Labs  Lab 08/15/21 0545 08/16/21 0454 08/17/21 0400  AST 580* 253* 155*  ALT 211* 157* 114*  ALKPHOS 804* 836* 715*  BILITOT 1.7* 2.3* 2.8*  PROT 5.7* 5.5* 5.9*  ALBUMIN <1.5* <1.5* <1.5*   No results for input(s): LIPASE, AMYLASE in the last 168 hours. No results for input(s): AMMONIA in the last 168 hours. CBC: Recent Labs  Lab 08/12/21 0233 08/12/21 1009 08/13/21 0415 08/13/21 1041 08/14/21 0801 08/15/21 0545 08/16/21 0454 08/17/21 0400  WBC 37.2*  --  35.2*  --  26.3* 22.0* 22.0* 21.4*  NEUTROABS 35.0*  --  30.2*  --  21.3*  --   --   --   HGB 7.5*   < >  7.1*   < > 8.1* 8.0* 8.6* 8.3*  HCT 23.2*   < > 21.8*   < > 25.8* 25.4* 27.2* 26.5*  MCV 87.9  --  89.7  --  90.5 89.4 91.0 90.8  PLT 452*  --  412*  --  381 377 381 352   < > = values in this interval not displayed.   Cardiac Enzymes: Recent Labs  Lab 08/14/21 1700  CKTOTAL 25*   CBG: Recent Labs  Lab 08/16/21 1531 08/16/21 1949 08/16/21 2325 08/17/21 0355 08/17/21 0729  GLUCAP 89 124* 169* 108* 124*    Iron Studies: No results for input(s): IRON, TIBC, TRANSFERRIN, FERRITIN in the last 72 hours. Studies/Results: No results found. Medications: Infusions:  sodium chloride 10 mL/hr at 08/17/21 0900   albumin human     ampicillin-sulbactam (UNASYN) IV Stopped (08/16/21 2351)   dexmedetomidine (PRECEDEX) IV infusion 0.5 mcg/kg/hr (08/17/21 0900)   feeding supplement (VITAL 1.5 CAL) 1,000 mL (08/17/21 0023)   fentaNYL infusion INTRAVENOUS Stopped (08/16/21 0752)   promethazine (PHENERGAN) injection (IM or IVPB) 25 mg (08/10/21 0506)   propofol (DIPRIVAN) infusion Stopped (08/16/21 0752)    Scheduled Medications:  sodium chloride   Intravenous Once   amLODipine  10 mg Per Tube Daily   chlorhexidine gluconate (MEDLINE KIT)   15 mL Mouth Rinse BID   Chlorhexidine Gluconate Cloth  6 each Topical Daily   feeding supplement (PROSource TF)  45 mL Per Tube TID   fluconazole  200 mg Per Tube Daily   gabapentin  300 mg Per Tube Q12H   heparin injection (subcutaneous)  5,000 Units Subcutaneous Q8H   insulin aspart  0-20 Units Subcutaneous Q4H   insulin aspart  4 Units Subcutaneous Q4H   lactulose  20 g Per Tube Daily   lidocaine  2 patch Transdermal Q24H   living well with diabetes book   Does not apply Once   mouth rinse  15 mL Mouth Rinse 10 times per day   methocarbamol  1,000 mg Oral TID   multivitamin with minerals  1 tablet Per Tube Daily   mupirocin ointment  1 application Topical BID   oxyCODONE  10 mg Per Tube Q6H   pantoprazole sodium  40 mg Per Tube Daily   sodium chloride flush  10-40 mL Intracatheter Q12H   sodium chloride flush  3 mL Intravenous Q12H   sucralfate  1 g Per Tube TID WC & HS   traZODone  100 mg Per Tube QHS    have reviewed scheduled and prn medications.  Physical Exam: General:  eyes open-  responding to wife at bedside Heart: RRR Lungs: poor effort, CBS Abdomen: soft, non tender Extremities: pitting edema     08/17/2021,9:25 AM  LOS: 11 days

## 2021-08-17 NOTE — Procedures (Signed)
Extubation Procedure Note  Patient Details:   Name: Jose [name] DOB: 07-Jun-1976 MRN: [id number]   Airway Documentation:    Vent end date: 08/17/21 Vent end time: 0910   Evaluation  O2 sats: stable throughout Complications: No apparent complications Patient did tolerate procedure well. Bilateral Breath Sounds: Rhonchi   Yes  Patient extubated to 4L [place] per MD order.  Positive cuff leak noted.  No evidence of stridor.  Patient able to speak post extubation.  Sats currently at 95%.  Vitals are stable.  No complications noted at this time.    Judith Part 08/17/2021, 9:16 AM

## 2021-08-17 NOTE — Progress Notes (Signed)
Physical Therapy Treatment Patient Details Name: Jose [name] MRN: [id number] DOB: 06/17/1976 Today's Date: 08/17/2021   History of Present Illness 45 y.o. male admitted on 08/06/2021 with worsening back pain, S/P T4-5 laminectomy, LT5 pediculotomy, and decompression of spinal cord on 10/14.  Pt with acute hypoxic respiratory failure in setting of recent GI bleed and recent spinal surgery with extensive pain that is impeding his ability to take deep breaths. Endoscopy 10/17. Intubated 10/19-10/24. PMhx: T2DM, retinopathy, neuropathy, HTN, HLD, CKD, covid July 2022    PT Comments    Pt post extubation today with limited cognition with lethargy presumed due to pain meds and kidney function. Pt with improved activity tolerance without report of pain and able to transition to sitting EOB grossly 7 min with max assist for balance but able to actively perform bil LE HEP and work on propping on forearms and rising to sitting. Pt with decreased STM and awareness with SPO2 >92% on 6L throughout with HR 115. D/C plan remains appropriate and will continue to follow.    Recommendations for follow up therapy are one component of a multi-disciplinary discharge planning process, led by the attending physician.  Recommendations may be updated based on patient status, additional functional criteria and insurance authorization.  Follow Up Recommendations  Acute inpatient rehab (3hours/day)     Assistance Recommended at Discharge Frequent or constant Supervision/Assistance  Equipment Recommendations  Other (comment) (TBD with progression)    Recommendations for Other Services       Precautions / Restrictions Precautions Precautions: Fall;Back Precaution Comments: cortrak     Mobility  Bed Mobility Overal bed mobility: Needs Assistance Bed Mobility: Rolling;Sidelying to Sit;Sit to Sidelying Rolling: Max assist Sidelying to sit: Total assist;+2 for physical assistance     Sit to sidelying:  Total assist;+2 for physical assistance General bed mobility comments: physical assist with pad to roll to left to rise to sitting with physical assist to move legs off of bed and elevate trunk as well as return to sidelying. Pt with physical assist for propping on bil forearms EOB. Total +2 to slide toward [facility]    Transfers                   General transfer comment: not yet able    Ambulation/Gait                 Stairs             Wheelchair Mobility    Modified Rankin (Stroke Patients Only)       Balance Overall balance assessment: Needs assistance Sitting-balance support: Feet supported;No upper extremity supported;Bilateral upper extremity supported Sitting balance-Leahy Scale: Zero Sitting balance - Comments: pt with max assist to control balance with LOB in all directions with multimodal cues                                    Cognition Arousal/Alertness: Awake/alert Behavior During Therapy: Flat affect Overall Cognitive Status: Impaired/Different from baseline Area of Impairment: Orientation;Attention;Memory;Following commands;Safety/judgement;Problem solving                 Orientation Level: Disoriented to;Time Current Attention Level: Focused Memory: Decreased short-term memory Following Commands: Follows one step commands with increased time;Follows one step commands inconsistently Safety/Judgement: Decreased awareness of safety   Problem Solving: Slow processing;Requires verbal cues;Requires tactile cues General Comments: pt oriented to place and month not day. Pt with  flat affect with apparent lethargy from medication and slow response to all questions and commands with lack of awareness of deficits        Exercises General Exercises - Lower Extremity Short Arc Quad: AROM;Both;Seated;10 reps Heel Slides: AROM;Both;Supine;10 reps    General Comments        Pertinent Vitals/Pain Pain Assessment: No/denies  pain    Home Living                          Prior Function            PT Goals (current goals can now be found in the care plan section) Progress towards PT goals: Progressing toward goals    Frequency    Min 3X/week      PT Plan Current plan remains appropriate    Co-evaluation PT/OT/SLP Co-Evaluation/Treatment: Yes   PT goals addressed during session: Mobility/safety with mobility;Balance;Strengthening/ROM        AM-PAC PT "6 Clicks" Mobility   Outcome Measure  Help needed turning from your back to your side while in a flat bed without using bedrails?: Total Help needed moving from lying on your back to sitting on the side of a flat bed without using bedrails?: Total Help needed moving to and from a bed to a chair (including a wheelchair)?: Total Help needed standing up from a chair using your arms (e.g., wheelchair or bedside chair)?: Total Help needed to walk in hospital room?: Total Help needed climbing 3-5 steps with a railing? : Total 6 Click Score: 6    End of Session Equipment Utilized During Treatment: Oxygen Activity Tolerance: Patient tolerated treatment well Patient left: in bed;with call bell/phone within reach;with bed alarm set Nurse Communication: Mobility status PT Visit Diagnosis: Other abnormalities of gait and mobility (R26.89);Muscle weakness (generalized) (M62.81)     Time: 1224-8250 PT Time Calculation (min) (ACUTE ONLY): 28 min  Charges:  $Therapeutic Activity: 8-22 mins                     Clorissa Gruenberg P, PT Acute Rehabilitation Services Pager: [phone] Office: [phone]    Sandy Salaam Iya Hamed 08/17/2021, 12:38 PM

## 2021-08-17 NOTE — Progress Notes (Signed)
Palliative care brief progress note  I checked in on [place] today following extubation.  He was slow to answer questions, but he was awake and alert and did respond appropriately.  Junie Panning was at the bedside as well and we discussed continued hope that he will improve with continued supportive care.  Discussed renal failure being next big challenge to see how he does with continued care.  While I am going off service, another member of the palliative medicine team would be happy to engage as needed.  Please call if there are palliative specific needs with which we can be of assistance.  Micheline Rough, MD [place] Team [phone]  No charge note

## 2021-08-18 ENCOUNTER — Inpatient Hospital Stay (HOSPITAL_COMMUNITY): Payer: 59

## 2021-08-18 DIAGNOSIS — E1142 Type 2 diabetes mellitus with diabetic polyneuropathy: Secondary | ICD-10-CM

## 2021-08-18 DIAGNOSIS — Z01818 Encounter for other preprocedural examination: Secondary | ICD-10-CM

## 2021-08-18 DIAGNOSIS — M869 Osteomyelitis, unspecified: Secondary | ICD-10-CM | POA: Diagnosis not present

## 2021-08-18 DIAGNOSIS — N179 Acute kidney failure, unspecified: Secondary | ICD-10-CM | POA: Diagnosis not present

## 2021-08-18 DIAGNOSIS — M4624 Osteomyelitis of vertebra, thoracic region: Secondary | ICD-10-CM | POA: Diagnosis not present

## 2021-08-18 DIAGNOSIS — J90 Pleural effusion, not elsewhere classified: Secondary | ICD-10-CM

## 2021-08-18 DIAGNOSIS — I10 Essential (primary) hypertension: Secondary | ICD-10-CM

## 2021-08-18 LAB — CBC
HCT: 26.1 % — ABNORMAL LOW (ref 39.0–52.0)
Hemoglobin: 8.1 g/dL — ABNORMAL LOW (ref 13.0–17.0)
MCH: 28.7 pg (ref 26.0–34.0)
MCHC: 31 g/dL (ref 30.0–36.0)
MCV: 92.6 fL (ref 80.0–100.0)
Platelets: 360 10*3/uL (ref 150–400)
RBC: 2.82 MIL/uL — ABNORMAL LOW (ref 4.22–5.81)
RDW: 15.9 % — ABNORMAL HIGH (ref 11.5–15.5)
WBC: 21.7 10*3/uL — ABNORMAL HIGH (ref 4.0–10.5)
nRBC: 0 % (ref 0.0–0.2)

## 2021-08-18 LAB — COMPREHENSIVE METABOLIC PANEL
ALT: 77 U/L — ABNORMAL HIGH (ref 0–44)
AST: 74 U/L — ABNORMAL HIGH (ref 15–41)
Albumin: 2.1 g/dL — ABNORMAL LOW (ref 3.5–5.0)
Alkaline Phosphatase: 636 U/L — ABNORMAL HIGH (ref 38–126)
Anion gap: 10 (ref 5–15)
BUN: 106 mg/dL — ABNORMAL HIGH (ref 6–20)
CO2: 21 mmol/L — ABNORMAL LOW (ref 22–32)
Calcium: 8.9 mg/dL (ref 8.9–10.3)
Chloride: 111 mmol/L (ref 98–111)
Creatinine, Ser: 3.73 mg/dL — ABNORMAL HIGH (ref 0.61–1.24)
GFR, Estimated: 19 mL/min — ABNORMAL LOW (ref >60)
Glucose, Bld: 244 mg/dL — ABNORMAL HIGH (ref 70–99)
Potassium: 5 mmol/L (ref 3.5–5.1)
Sodium: 142 mmol/L (ref 135–145)
Total Bilirubin: 3 mg/dL — ABNORMAL HIGH (ref 0.3–1.2)
Total Protein: 6.3 g/dL — ABNORMAL LOW (ref 6.5–8.1)

## 2021-08-18 LAB — GLUCOSE, CAPILLARY
Glucose-Capillary: 222 mg/dL — ABNORMAL HIGH (ref 70–99)
Glucose-Capillary: 232 mg/dL — ABNORMAL HIGH (ref 70–99)
Glucose-Capillary: 229 mg/dL — ABNORMAL HIGH (ref 70–99)
Glucose-Capillary: 184 mg/dL — ABNORMAL HIGH (ref 70–99)
Glucose-Capillary: 210 mg/dL — ABNORMAL HIGH (ref 70–99)
Glucose-Capillary: 207 mg/dL — ABNORMAL HIGH (ref 70–99)

## 2021-08-18 LAB — PATHOLOGIST SMEAR REVIEW

## 2021-08-18 MED ORDER — VITAL 1.5 CAL PO LIQD
1000.0000 mL | ORAL | Status: DC
  Administered 2021-08-18 – 2021-08-26 (×9): 1000 mL
  Filled 2021-08-18 (×8): qty 1000

## 2021-08-18 MED ORDER — PROSOURCE TF PO LIQD
45.0000 mL | Freq: Two times a day (BID) | ORAL | Status: DC
  Administered 2021-08-18 – 2021-08-27 (×17): 45 mL
  Filled 2021-08-18 (×17): qty 45

## 2021-08-18 MED ORDER — METOPROLOL TARTRATE 25 MG/10 ML ORAL SUSPENSION
25.0000 mg | Freq: Two times a day (BID) | ORAL | Status: DC
  Administered 2021-08-18 (×2): 25 mg
  Filled 2021-08-18 (×3): qty 10

## 2021-08-18 MED ORDER — MELATONIN 3 MG PO TABS
3.0000 mg | ORAL_TABLET | Freq: Every day | ORAL | Status: DC
  Administered 2021-08-18 – 2021-08-25 (×5): 3 mg via ORAL
  Filled 2021-08-18 (×5): qty 1

## 2021-08-18 MED ORDER — INSULIN GLARGINE-YFGN 100 UNIT/ML ~~LOC~~ SOLN
10.0000 [IU] | Freq: Two times a day (BID) | SUBCUTANEOUS | Status: DC
  Administered 2021-08-18 (×2): 10 [IU] via SUBCUTANEOUS
  Filled 2021-08-18 (×4): qty 0.1

## 2021-08-18 MED ORDER — ALBUMIN HUMAN 25 % IV SOLN
25.0000 g | Freq: Four times a day (QID) | INTRAVENOUS | Status: AC
  Administered 2021-08-18 (×2): 25 g via INTRAVENOUS
  Filled 2021-08-18 (×2): qty 100

## 2021-08-18 MED ORDER — HEPARIN SODIUM (PORCINE) 5000 UNIT/ML IJ SOLN
5000.0000 [IU] | Freq: Three times a day (TID) | INTRAMUSCULAR | Status: DC
  Filled 2021-08-18: qty 1

## 2021-08-18 NOTE — Progress Notes (Addendum)
[name] for Infectious Disease    Date of Admission:  08/06/2021   Total days of antibiotics 12/day 5 amp/sub           ID: Jose [name] is a 45 y.o. male with MSSA thoracic spine epidural abscess and osteomyelitis s/p decompression/laminectomy on 10/14.  Principal Problem:   Osteomyelitis of thoracic spine (HCC) Active Problems:   Type 2 diabetes mellitus with diabetic polyneuropathy, without long-term current use of insulin (HCC)   Hyperlipidemia   GAD (generalized anxiety disorder)   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)   Essential hypertension   CKD (chronic kidney disease) stage 3, GFR 30-59 ml/min (HCC)   Pleural effusion, bilateral    Subjective: Afebrile, but remains still having slow mentation/confusion while in ICU  Medications:   sodium chloride   Intravenous Once   amLODipine  10 mg Per Tube Daily   Chlorhexidine Gluconate Cloth  6 each Topical Daily   darbepoetin (ARANESP) injection - NON-DIALYSIS  150 mcg Subcutaneous Q Mon-1800   feeding supplement (PROSource TF)  45 mL Per Tube BID   fluconazole  200 mg Per Tube Daily   gabapentin  200 mg Per Tube Q12H   [START ON 08/19/2021] heparin injection (subcutaneous)  5,000 Units Subcutaneous Q8H   insulin aspart  0-20 Units Subcutaneous Q4H   insulin aspart  8 Units Subcutaneous Q4H   insulin glargine-yfgn  10 Units Subcutaneous BID   lactulose  20 g Per Tube Daily   lidocaine  2 patch Transdermal Q24H   living well with diabetes book   Does not apply Once   mouth rinse  15 mL Mouth Rinse BID   melatonin  3 mg Oral QHS   methocarbamol  1,000 mg Per Tube TID   metoprolol tartrate  25 mg Per Tube BID   multivitamin with minerals  1 tablet Per Tube Daily   mupirocin ointment  1 application Topical BID   oxyCODONE  10 mg Per Tube Q6H   pantoprazole sodium  40 mg Per Tube Daily   sodium chloride flush  10-40 mL Intracatheter Q12H   sodium chloride flush  3 mL Intravenous Q12H   sucralfate  1 g Per  Tube TID WC & HS   traZODone  100 mg Per Tube QHS   venlafaxine  37.5 mg Per Tube Daily    Objective: Vital signs in last 24 hours: Temp:  [98.3 F (36.8 C)-100.9 F (38.3 C)] 99.2 F (37.3 C) (10/25 1939) Pulse Rate:  [92-115] 106 (10/25 1800) Resp:  [14-34] 21 (10/25 1800) BP: (110-172)/(56-88) 137/72 (10/25 1800) SpO2:  [86 %-100 %] 100 % (10/25 1800) Weight:  [100.1 kg] 100.1 kg (10/25 0238) Physical Exam  Constitutional: He is oriented to person,. He appears well-developed and well-nourished. No distress.  HENT:  Mouth/Throat: Oropharynx is clear and moist. No oropharyngeal exudate.  Cardiovascular: Normal rate, regular rhythm and normal heart sounds. Exam reveals no gallop and no friction rub.  No murmur heard.  Pulmonary/Chest: Effort normal and breath sounds normal. No respiratory distress. He has no wheezes.  Abdominal: Soft. Bowel sounds are normal. He exhibits no distension. There is no tenderness.  Lymphadenopathy:  He has no cervical adenopathy.  Neurological: He is alert and oriented to person, place, and time.  Skin: Skin is warm and dry. No rash noted. No erythema.  Psychiatric: He has a normal mood and affect. His behavior is normal.    Lab Results Recent Labs  08/17/21 0400 08/18/21 0334  WBC 21.4* 21.7*  HGB 8.3* 8.1*  HCT 26.5* 26.1*  NA 141 142  K 5.0 5.0  CL 107 111  CO2 21* 21*  BUN 113* 106*  CREATININE 4.53* 3.73*   Liver Panel Recent Labs    08/17/21 0400 08/18/21 0334  PROT 5.9* 6.3*  ALBUMIN <1.5* 2.1*  AST 155* 74*  ALT 114* 77*  ALKPHOS 715* 636*  BILITOT 2.8* 3.0*   Sedimentation Rate No results for input(s): ESRSEDRATE in the last 72 hours. C-Reactive Protein No results for input(s): CRP in the last 72 hours.  Microbiology: 10/19 blood cx - NGTD Studies/Results: DG CHEST PORT 1 VIEW  Result Date: 08/18/2021 CLINICAL DATA:  Dyspnea EXAM: PORTABLE CHEST 1 VIEW COMPARISON:  08/14/2021 FINDINGS: Low lung volumes.  Persistent left lung opacities with greater opacification at the lung base. New patchy density at the right lung base. Possible left pleural effusion. Normal heart size. Enteric tube is again identified. IMPRESSION: Low lung volumes with persistent left lung pneumonia. Worsening of atelectasis/consolidation at the lung bases. Possible small left pleural effusion. Electronically Signed   By: Macy Mis M.D.   On: 08/18/2021 15:51     Assessment/Plan: MSSA thoracic osteomyelitis/epidural abscess s/p evacuation/decompression = continue with amp/sub for now until finished with treatment for aspiration pneumonia, then switch back to cefazolin. Plan to treat for 6 wk since decompression. With end date of 09/24/21. Opat orders in place.  Aspiration pneumonia = currently on amp/sub  Hypoxia = unclear if transient. Recommend starting with CXR +/- eval for PE.   Esophageal candidiasis = plan to treat for 14 days with fluconazole 400mg  daily  Will sign off and set up follow up appt in 4 wk.  [facility] for Infectious Diseases Pager: [phone]  08/18/2021, 8:19 PM

## 2021-08-18 NOTE — Progress Notes (Signed)
[facility] PROGRESS NOTE    Progress Note  DYMIR NEESON  GXQ:[id number] DOB: October 29, 1975 DOA: 08/06/2021 PCP: [name], NP     Brief Narrative:   Jose [name] is an 45 y.o. male past medical history of essential hypertension, with a recent COVID-19 infection about a month ago macular degeneration, diabetes mellitus type 2, chronic kidney disease stage III recent admission for MSSA bacteremia/osteomyelitis status post T4-T5 laminectomy and spinal cord decompression on 08/07/2021 who presents back to the hospital with worsening back pain constipation abdominal distention which started on broad-spectrum antibiotics for progression of osteomyelitis.  On 08/10/2021 developed coffee-ground emesis GI was consulted to status post EGD that showed esophagitis with no bleeding and a nonbleeding duodenal ulcer on Protonix.  Unfortunately he started developing acute respiratory failure critical care was consulted and transferred to their service on 08/11/2019    Significant Events: 10/13 -admitted with worsening thoracic osteomyelitis, constipation/abd distention, neurosurgery consulted  10/14 - T4-T5 laminectomy, left T5 pediculotomy, and spinal cord decompression 10/17 - Hgb drop to 6.2, coffee ground emesis s/p EGD with esophagitis and peptic ulcer 10/17 - hypoxia and pccm consult  10/19 intubated 10/21 developed worsening kidney function, nephro consulted.  10/24 extubated  Significant studies: 08/06/2021 CT abdomen pelvis showed large stool burden throughout the colon 10/13/ 2022 CTA negative for PE moderate bilateral effusion worsening destructive lesion of 04/27/2009 5 with soft tissue thickening consistent with osteomyelitis and discitis. 08/06/2021 MRI of the spine showed enhancement of T4 and T5 along with a phlegmon, pathologic fracture of T5 with 50% height loss with 4 mm retropulsion. 08/10/2021 EGD showed nonbleeding duodenal ulcer 08/11/2021 CT of the head showed no  acute intracranial processes.  Antibiotics: Cefepime 10/13-10/13  Augmentin 08/11/2021 to 08/17/2021 Cefazolin 10/13 > Zosyn 10/17 one Vancomycin 10/17-10 Fluconazole 08/13/2021  Microbiology data: 10/13 - Bcx no growth x5d 10/13 - Ucx no growth 10/18 - resp culture negative 10/21 - pleural fluid no growth at 2d  Procedures: 10/14 - T4-T5 laminectomy, left T5 pediculotomy, and spinal cord decompression 10/17 - EGD 10/19 intubation  Assessment/Plan:   Acute respiratory failure with hypoxia: Likely due to splinting plus or minus aspiration pneumonia in the setting of severe disease of the left lung. Completed course for aspiration pneumonia on 08/18/2019. Intubated on 08/12/2021 extubated on 08/17/2021. Still requiring 6 L of oxygen to keep saturations greater than 90%, try to wean to room air out of bed to chair incentive spirometry continue to work with physical therapy.  MSSA osteomyelitis T4 and T5 with spinal compression status post laminectomy with pediculectomy and decompression of the spinal cord: Postop procedure complicated by severe pain. ID is on board recommended to change Augmentin to cefazolin.  Acute blood loss anemia possibly due from Candida esophagitis: Status post EGD that showed esophagitis and nonbleeding duodenal ulcers. Last transfusion on 08/14/2021, hemoglobin has remained relatively stable.  ATN on top of chronic kidney disease stage IIIa: Nephrology has been consulted with a baseline creatinine of 1.5-2.0. With brisk diuresis, nephrology on board appreciate assistance. Creatinine is slightly improving  Hyperkalemia: Nephrology recommended supportive treatment, potassium persistently elevated at 5.0. Nephrology will probably need bicarbonate or Lasix.  Diabetes mellitus type 2 with hyperglycemia: On Insulin plus sliding scale blood glucose has remained persistently elevated, go ahead and double long-acting insulin. Core track and placed since  08/12/2021.  Shock liver: Continue conservative management LFTs are improving.  Sinus tachycardia: Had a transesophageal echo on 04/28/2021 that showed an EF of 60% no wall motion  abnormality.  Before admission was on verapamil. He was previously on verapamil now if not we will start him on low-dose metoprolol. His blood pressure can tolerate it.  Diabetic ulcer of left great toe ([place])  Essential hypertension Blood pressure is trending up we will allow permissive hypertension for renal protection. Start on low-dose metoprolol.  Pleural effusion, bilateral Due to third spacing, he was giving albumin with good diuresis.  Sacral decubitus ulcer stage II present on admission: RN Pressure Injury Documentation: Pressure Injury 08/10/21 Coccyx Medial Stage 2 -  Partial thickness loss of dermis presenting as a shallow open injury with a red, pink wound bed without slough. (Active)  08/10/21 1700  Location: Coccyx  Location Orientation: Medial  Staging: Stage 2 -  Partial thickness loss of dermis presenting as a shallow open injury with a red, pink wound bed without slough.  Wound Description (Comments):   Present on Admission: Yes     Pressure Injury 08/14/21 Buttocks Bilateral Deep Tissue Pressure Injury - Purple or maroon localized area of discolored intact skin or blood-filled blister due to damage of underlying soft tissue from pressure and/or shear. DTI (Active)  08/14/21 0800  Location: Buttocks  Location Orientation: Bilateral  Staging: Deep Tissue Pressure Injury - Purple or maroon localized area of discolored intact skin or blood-filled blister due to damage of underlying soft tissue from pressure and/or shear.  Wound Description (Comments): DTI  Present on Admission:      DVT prophylaxis: scd Family Communication:none Status is: Inpatient  Remains inpatient appropriate because: Due to acute severity of illness sepsis in the setting of osteomyelitis aspiration pneumonia,  with new acute kidney injury leading to ATN        Code Status:     Code Status Orders  (From admission, onward)           Start     Ordered   08/06/21 1536  Full code  Continuous        08/06/21 1537           Code Status History     Date Active Date Inactive Code Status Order ID Comments User Context   07/22/2021 1844 08/03/2021 1712 Full Code [id number]  Terrilee Croak, MD Inpatient   05/19/2021 0418 05/21/2021 1847 Full Code [id number]  Tacey Ruiz, MD Inpatient      Advance Directive Documentation    Flowsheet Row Most Recent Value  Type of Advance Directive Healthcare Power of Attorney  Pre-existing out of facility DNR order (yellow form or pink MOST form) --  "MOST" Form in Place? --         IV Access:   Peripheral IV   Procedures and diagnostic studies:   No results found.   Medical Consultants:   None.   Subjective:    LAVONE WEISEL relates he is very tired he does not want to die no complaints of pain.  Objective:    Vitals:   08/18/21 0310 08/18/21 0400 08/18/21 0500 08/18/21 0600  BP:  (!) 165/77 (!) 157/81 (!) 161/80  Pulse:  (!) 111 (!) 115 (!) 110  Resp:  (!) 25 (!) 24 (!) 23  Temp: 98.5 F (36.9 C)     TempSrc: Oral     SpO2:  95% (!) 88% 94%  Weight:      Height:       SpO2: 94 % O2 Flow Rate (L/min): 6 L/min FiO2 (%): 40 %   Intake/Output Summary (Last 24 hours) at 08/18/2021 0741  Last data filed at 08/18/2021 0600 Gross per 24 hour  Intake 1754.59 ml  Output 2235 ml  Net -480.41 ml   Filed Weights   08/16/21 0500 08/17/21 0500 08/18/21 0238  Weight: 95.7 kg 97.7 kg 100.1 kg    Exam: General exam: In no acute distress. Respiratory system: Good air movement and clear to auscultation. Cardiovascular system: S1 & S2 heard, RRR. No JVD. Gastrointestinal system: Abdomen is nondistended, soft and nontender.  Extremities: No pedal edema. Skin: No rashes, lesions or ulcers Psychiatry: Judgement and  insight appear normal. Mood & affect appropriate.   Data Reviewed:    Labs: Basic Metabolic Panel: Recent Labs  Lab 08/13/21 0415 08/13/21 1041 08/13/21 1700 08/14/21 0415 08/14/21 0801 08/14/21 1700 08/15/21 0545 08/16/21 0454 08/17/21 0400 08/18/21 0334  NA 140   < >  --   --    < > 137 140 138 141 142  K 3.9   < >  --   --    < > 4.8 4.6 4.7 5.0 5.0  CL 109  --   --   --    < > 105 107 107 107 111  CO2 22  --   --   --    < > 21* 22 21* 21* 21*  GLUCOSE 208*  --   --   --    < > 365* 225* 139* 125* 244*  BUN 55*  --   --   --    < > 85* 97* 107* 113* 106*  CREATININE 1.89*  --   --   --    < > 3.78* 4.23* 4.43* 4.53* 3.73*  CALCIUM 8.4*  --   --   --    < > 8.2* 8.3* 8.1* 8.4* 8.9  MG 2.2  --  2.4 2.5*  --  2.6* 2.6*  --   --   --   PHOS 3.8  --  3.5 4.2  --  3.7 3.2  --   --   --    < > = values in this interval not displayed.   GFR Estimated Creatinine Clearance: 29.5 mL/min (A) (by C-G formula based on SCr of 3.73 mg/dL (H)). Liver Function Tests: Recent Labs  Lab 08/14/21 1700 08/15/21 0545 08/16/21 0454 08/17/21 0400 08/18/21 0334  AST 624* 580* 253* 155* 74*  ALT 169* 211* 157* 114* 77*  ALKPHOS 770* 804* 836* 715* 636*  BILITOT 1.7* 1.7* 2.3* 2.8* 3.0*  PROT 5.9* 5.7* 5.5* 5.9* 6.3*  ALBUMIN 1.6* <1.5* <1.5* <1.5* 2.1*   No results for input(s): LIPASE, AMYLASE in the last 168 hours. No results for input(s): AMMONIA in the last 168 hours. Coagulation profile No results for input(s): INR, PROTIME in the last 168 hours. COVID-19 Labs  No results for input(s): DDIMER, FERRITIN, LDH, CRP in the last 72 hours.  Lab Results  Component Value Date   SARSCOV2NAA POSITIVE (A) 08/06/2021   SARSCOV2NAA NEGATIVE 07/22/2021   SARSCOV2NAA POSITIVE (A) 05/18/2021   SARSCOV2NAA NOT DETECTED 10/22/2019    CBC: Recent Labs  Lab 08/12/21 0233 08/12/21 1009 08/13/21 0415 08/13/21 1041 08/14/21 0801 08/15/21 0545 08/16/21 0454 08/17/21 0400 08/18/21 0334   WBC 37.2*  --  35.2*  --  26.3* 22.0* 22.0* 21.4* 21.7*  NEUTROABS 35.0*  --  30.2*  --  21.3*  --   --   --   --   HGB 7.5*   < > 7.1*   < > 8.1* 8.0* 8.6*  8.3* 8.1*  HCT 23.2*   < > 21.8*   < > 25.8* 25.4* 27.2* 26.5* 26.1*  MCV 87.9  --  89.7  --  90.5 89.4 91.0 90.8 92.6  PLT 452*  --  412*  --  381 377 381 352 360   < > = values in this interval not displayed.   Cardiac Enzymes: Recent Labs  Lab 08/14/21 1700  CKTOTAL 25*   BNP (last 3 results) No results for input(s): PROBNP in the last 8760 hours. CBG: Recent Labs  Lab 08/17/21 1122 08/17/21 1528 08/17/21 1935 08/17/21 2342 08/18/21 0337  GLUCAP 215* 257* 225* 262* 222*   D-Dimer: No results for input(s): DDIMER in the last 72 hours. Hgb A1c: No results for input(s): HGBA1C in the last 72 hours. Lipid Profile: Recent Labs    08/17/21 0400  TRIG 174*   Thyroid function studies: No results for input(s): TSH, T4TOTAL, T3FREE, THYROIDAB in the last 72 hours.  Invalid input(s): FREET3 Anemia work up: No results for input(s): VITAMINB12, FOLATE, FERRITIN, TIBC, IRON, RETICCTPCT in the last 72 hours. Sepsis Labs: Recent Labs  Lab 08/12/21 0233 08/13/21 0415 08/14/21 0801 08/15/21 0545 08/16/21 0454 08/17/21 0400 08/18/21 0334  PROCALCITON 1.85 3.29  --   --   --   --   --   WBC 37.2* 35.2*   < > 22.0* 22.0* 21.4* 21.7*   < > = values in this interval not displayed.   Microbiology Recent Results (from the past 240 hour(s))  Culture, blood (routine x 2)     Status: None   Collection Time: 08/12/21  8:19 AM   Specimen: BLOOD  Result Value Ref Range Status   Specimen Description BLOOD LEFT ANTECUBITAL  Final   Special Requests   Final    BOTTLES DRAWN AEROBIC AND ANAEROBIC Blood Culture adequate volume   Culture   Final    NO GROWTH 5 DAYS Performed at [facility], [address], [place], [place] [address]    Report Status 08/17/2021 FINAL  Final  Culture, blood (routine x 2)     Status:  None   Collection Time: 08/12/21  8:20 AM   Specimen: BLOOD LEFT HAND  Result Value Ref Range Status   Specimen Description BLOOD LEFT HAND  Final   Special Requests   Final    BOTTLES DRAWN AEROBIC ONLY Blood Culture results may not be optimal due to an inadequate volume of blood received in culture bottles   Culture   Final    NO GROWTH 5 DAYS Performed at [facility], [place] [address] [place], [place] [address]    Report Status 08/17/2021 FINAL  Final  Culture, Respiratory w Gram Stain     Status: None   Collection Time: 08/13/21  4:30 PM   Specimen: Tracheal Aspirate; Respiratory  Result Value Ref Range Status   Specimen Description TRACHEAL ASPIRATE  Final   Special Requests Normal  Final   Gram Stain   Final    NO SQUAMOUS EPITHELIAL CELLS SEEN FEW WBC SEEN NO ORGANISMS SEEN    Culture   Final    NO GROWTH 2 DAYS Performed at [facility], [address] [place], [place] [address]    Report Status 08/15/2021 FINAL  Final  Body fluid culture w Gram Stain     Status: None   Collection Time: 08/14/21  8:15 AM   Specimen: Pleural Fluid  Result Value Ref Range Status  Specimen Description FLUID  Final   Special Requests  PLEURAL FLUID  Final   Gram Stain   Final    MODERATE WBC PRESENT, PREDOMINANTLY MONONUCLEAR NO ORGANISMS SEEN    Culture   Final    NO GROWTH 3 DAYS Performed at [facility], [address] [place], [place] [address]    Report Status 08/17/2021 FINAL  Final     Medications:    sodium chloride   Intravenous Once   amLODipine  10 mg Per Tube Daily   Chlorhexidine Gluconate Cloth  6 each Topical Daily   darbepoetin (ARANESP) injection - NON-DIALYSIS  150 mcg Subcutaneous Q Mon-1800   feeding supplement (PROSource TF)  45 mL Per Tube TID   fluconazole  200 mg Per Tube Daily   gabapentin  200 mg Per Tube Q12H   heparin injection (subcutaneous)  5,000 Units Subcutaneous Q8H   insulin aspart  0-20 Units Subcutaneous Q4H    insulin aspart  8 Units Subcutaneous Q4H   insulin glargine-yfgn  10 Units Subcutaneous QHS   lactulose  20 g Per Tube Daily   lidocaine  2 patch Transdermal Q24H   living well with diabetes book   Does not apply Once   mouth rinse  15 mL Mouth Rinse BID   methocarbamol  1,000 mg Per Tube TID   multivitamin with minerals  1 tablet Per Tube Daily   mupirocin ointment  1 application Topical BID   oxyCODONE  10 mg Per Tube Q6H   pantoprazole sodium  40 mg Per Tube Daily   sodium chloride flush  10-40 mL Intracatheter Q12H   sodium chloride flush  3 mL Intravenous Q12H   sucralfate  1 g Per Tube TID WC & HS   traZODone  100 mg Per Tube QHS   venlafaxine  37.5 mg Per Tube Daily   Continuous Infusions:  sodium chloride 10 mL/hr at 08/18/21 0600    ceFAZolin (ANCEF) IV Stopped (08/17/21 2335)   dexmedetomidine (PRECEDEX) IV infusion Stopped (08/17/21 1425)   feeding supplement (VITAL 1.5 CAL) 1,000 mL (08/17/21 2043)   fentaNYL infusion INTRAVENOUS Stopped (08/16/21 0752)   promethazine (PHENERGAN) injection (IM or IVPB) 25 mg (08/10/21 0506)      LOS: 12 days   Charlynne Cousins  [facility]  08/18/2021, 7:41 AM

## 2021-08-18 NOTE — Progress Notes (Signed)
Subjective:   2235 of UOP !  BUN and crt both down !  He is more alert but confused-  wife is having a tough time Objective Vital signs in last 24 hours: Vitals:   08/18/21 0500 08/18/21 0600 08/18/21 0700 08/18/21 0824  BP: (!) 157/81 (!) 161/80 (!) 150/82   Pulse: (!) 115 (!) 110 (!) 106   Resp: (!) 24 (!) 23 (!) 24   Temp:    99.1 F (37.3 C)  TempSrc:    Oral  SpO2: (!) 88% 94% 90%   Weight:      Height:       Weight change: 2.4 kg  Intake/Output Summary (Last 24 hours) at 08/18/2021 0836 Last data filed at 08/18/2021 0754 Gross per 24 hour  Intake 1743.24 ml  Output 1935 ml  Net -191.76 ml    Assessment/ Plan: Pt is a 45 y.o. yo male CKD ( crt 1.5-2) , DM, HTN who was admitted on 08/06/2021 with MSSA bacteremia/osteomyelitis- s/p T4/5 laminectomy, pediculetomy and spinal cord decompression-  complicating A on CRF Assessment/Plan: 1. Renal-  baseline crt 1.5-2.  Now with A on CRF in the setting of above-  non oliguric and UOP increasing.  Today is first day crt has trended down !! No absolute need for HD , clinically improving so hope that we will continue to see improvement -   2. HTN/vol-  overloaded but UOP improving on own,   will not do diuretics  3. Anemia-  hgb low but stable-  added on ESA-  no iron due to osteo  4. Hyperkalemia-  supportive treatment for now- with increased UOP I feel like it will normalize on own   [place]: Basic Metabolic Panel: Recent Labs  Lab 08/14/21 0415 08/14/21 0801 08/14/21 1700 08/15/21 0545 08/16/21 0454 08/17/21 0400 08/18/21 0334  NA  --    < > 137 140 138 141 142  K  --    < > 4.8 4.6 4.7 5.0 5.0  CL  --    < > 105 107 107 107 111  CO2  --    < > 21* 22 21* 21* 21*  GLUCOSE  --    < > 365* 225* 139* 125* 244*  BUN  --    < > 85* 97* 107* 113* 106*  CREATININE  --    < > 3.78* 4.23* 4.43* 4.53* 3.73*  CALCIUM  --    < > 8.2* 8.3* 8.1* 8.4* 8.9  PHOS 4.2  --  3.7 3.2  --   --   --    < > = values  in this interval not displayed.   Liver Function Tests: Recent Labs  Lab 08/16/21 0454 08/17/21 0400 08/18/21 0334  AST 253* 155* 74*  ALT 157* 114* 77*  ALKPHOS 836* 715* 636*  BILITOT 2.3* 2.8* 3.0*  PROT 5.5* 5.9* 6.3*  ALBUMIN <1.5* <1.5* 2.1*   No results for input(s): LIPASE, AMYLASE in the last 168 hours. No results for input(s): AMMONIA in the last 168 hours. CBC: Recent Labs  Lab 08/12/21 0233 08/12/21 1009 08/13/21 0415 08/13/21 1041 08/14/21 0801 08/15/21 0545 08/16/21 0454 08/17/21 0400 08/18/21 0334  WBC 37.2*  --  35.2*  --  26.3* 22.0* 22.0* 21.4* 21.7*  NEUTROABS 35.0*  --  30.2*  --  21.3*  --   --   --   --   HGB 7.5*   < > 7.1*   < >  8.1* 8.0* 8.6* 8.3* 8.1*  HCT 23.2*   < > 21.8*   < > 25.8* 25.4* 27.2* 26.5* 26.1*  MCV 87.9  --  89.7  --  90.5 89.4 91.0 90.8 92.6  PLT 452*  --  412*  --  381 377 381 352 360   < > = values in this interval not displayed.   Cardiac Enzymes: Recent Labs  Lab 08/14/21 1700  CKTOTAL 25*   CBG: Recent Labs  Lab 08/17/21 1528 08/17/21 1935 08/17/21 2342 08/18/21 0337 08/18/21 0754  GLUCAP 257* 225* 262* 222* 232*    Iron Studies: No results for input(s): IRON, TIBC, TRANSFERRIN, FERRITIN in the last 72 hours. Studies/Results: No results found. Medications: Infusions:  sodium chloride 10 mL/hr at 08/18/21 0700   albumin human      ceFAZolin (ANCEF) IV Stopped (08/17/21 2335)   dexmedetomidine (PRECEDEX) IV infusion Stopped (08/17/21 1425)   feeding supplement (VITAL 1.5 CAL) 1,000 mL (08/17/21 2043)   fentaNYL infusion INTRAVENOUS Stopped (08/16/21 0752)   promethazine (PHENERGAN) injection (IM or IVPB) 25 mg (08/10/21 0506)    Scheduled Medications:  sodium chloride   Intravenous Once   amLODipine  10 mg Per Tube Daily   Chlorhexidine Gluconate Cloth  6 each Topical Daily   darbepoetin (ARANESP) injection - NON-DIALYSIS  150 mcg Subcutaneous Q Mon-1800   feeding supplement (PROSource TF)  45 mL  Per Tube TID   fluconazole  200 mg Per Tube Daily   gabapentin  200 mg Per Tube Q12H   heparin injection (subcutaneous)  5,000 Units Subcutaneous Q8H   insulin aspart  0-20 Units Subcutaneous Q4H   insulin aspart  8 Units Subcutaneous Q4H   insulin glargine-yfgn  10 Units Subcutaneous BID   lactulose  20 g Per Tube Daily   lidocaine  2 patch Transdermal Q24H   living well with diabetes book   Does not apply Once   mouth rinse  15 mL Mouth Rinse BID   methocarbamol  1,000 mg Per Tube TID   metoprolol tartrate  25 mg Per Tube BID   multivitamin with minerals  1 tablet Per Tube Daily   mupirocin ointment  1 application Topical BID   oxyCODONE  10 mg Per Tube Q6H   pantoprazole sodium  40 mg Per Tube Daily   sodium chloride flush  10-40 mL Intracatheter Q12H   sodium chloride flush  3 mL Intravenous Q12H   sucralfate  1 g Per Tube TID WC & HS   traZODone  100 mg Per Tube QHS   venlafaxine  37.5 mg Per Tube Daily    have reviewed scheduled and prn medications.  Physical Exam: General:  eyes open-  restless-  requiring redirect in use of suction device Heart: RRR Lungs: poor effort, CBS Abdomen: soft, non tender Extremities: pitting edema     08/18/2021,8:36 AM  LOS: 12 days

## 2021-08-18 NOTE — Evaluation (Signed)
Clinical/Bedside Swallow Evaluation Patient Details  Name: Jose [name] MRN: [id number] Date of Birth: 02-01-1976  Today's Date: 08/18/2021 Time: SLP Start Time (ACUTE ONLY): 1950 SLP Stop Time (ACUTE ONLY): 0955 SLP Time Calculation (min) (ACUTE ONLY): 29 min  Past Medical History:  Past Medical History:  Diagnosis Date   GAD (generalized anxiety disorder)    Hyperlipidemia    Macular degeneration, bilateral    Retinopathy    Type II diabetes mellitus with complication, uncontrolled    retinopathy, neuropathy, microalbuminuria   Past Surgical History:  Past Surgical History:  Procedure Laterality Date   APPENDECTOMY     BIOPSY  08/10/2021   Procedure: BIOPSY;  Surgeon: Otis Brace, MD;  Location: [place];  Service: Gastroenterology;;   BUBBLE STUDY  07/29/2021   Procedure: BUBBLE STUDY;  Surgeon: Jerline Pain, MD;  Location: [place];  Service: Cardiovascular;;   ESOPHAGOGASTRODUODENOSCOPY (EGD) WITH PROPOFOL N/A 08/10/2021   Procedure: ESOPHAGOGASTRODUODENOSCOPY (EGD) WITH PROPOFOL;  Surgeon: Otis Brace, MD;  Location: [place];  Service: Gastroenterology;  Laterality: N/A;   HERNIA REPAIR     IR FLUORO GUIDED NEEDLE PLC ASPIRATION/INJECTION LOC  07/28/2021   LUMBAR LAMINECTOMY/DECOMPRESSION MICRODISCECTOMY N/A 08/07/2021   Procedure: THORACIC FOUR - THORACIC FIVE LAMINECTOMY/DECOMPRESSION OF SPINAL CORD, DEBRIDEMENT OF ABSCESS, MICRODISCECTOMY, INTRAOPERATIVE ULTRASOUND;  Surgeon: Consuella Lose, MD;  Location: [place];  Service: Neurosurgery;  Laterality: N/A;   TEE WITHOUT CARDIOVERSION N/A 07/29/2021   Procedure: TRANSESOPHAGEAL ECHOCARDIOGRAM (TEE);  Surgeon: Jerline Pain, MD;  Location: [facility] ENDOSCOPY;  Service: Cardiovascular;  Laterality: N/A;   TRIGGER FINGER RELEASE Right 10/25/2019   Procedure: RIGHT INDEX FINGER RELEASE TRIGGER FINGER/A-1 PULLEY;  Surgeon: Daryll Brod, MD;  Location: [place];  Service: Orthopedics;   Laterality: Right;  IV REGIONAL FOREARM BLOCK   HPI:  45 y.o. male admitted on 08/06/2021 with worsening back pain, constipation, abdominal distention . 10/17 incidence of coffee ground emesis with EGD revealing "esophagitis with no bleeding and a nonbleeding duodenal ulcer on Protonix". Pt developed acute hypoxic respiratory failure ETT 10/19-10/24. CXR (10/21) significant for "slightly improved extensive patchy airspace opacities in the left lung, compatible with pneumonia". PMhx: HTN, with a recent COVID-19 infection, macular degeneration, diabetes mellitus type 2,CKD stage III, recent admission for MSSA bacteremia/osteomyelitis status post T4-T5 laminectomy and spinal cord decompression on 08/07/2021.    Assessment / Plan / Recommendation  Clinical Impression  Pt seen for swallow eval this am with wife present, who reports no hx of dysphagia at baseline. Oral mechanism examination limited due to reduced mentation, although facial/lingual symmetry appeared [facility], vocal quality clear. Pt demonstrated adequate oral manipulation across all bolus trials, although oral transit often disrupted by reduced attention/lethargy, resulting in suspected delay in swallow initiation and overt coughing with POs. Delayed cough x1 observed with ice chips of varying sizes with increased coughing noted with liquids via cup/straw and bites of puree, with x1 instance of regurgitation/emesis occuring post puree intake. Pt's fluctating alertness/attention appears to impact swallow function and safety significantly at this time. Given reduced mentation and recent prolonged intubation increasing pt risk for aspiration, recommend NPO with ice chips after thorough oral care. SLP to f/u for PO readiness and if instrumental assessment is warranted. Educated wife and notified RN who all expressed understanding and agreement of recommendations.  SLP Visit Diagnosis: Dysphagia, unspecified (R13.10)    Aspiration Risk  Severe aspiration  risk    Diet Recommendation Ice chips PRN after oral care;NPO   Liquid Administration via: [employer]  Medication Administration: Via alternative means Supervision: Full supervision/cueing for compensatory strategies Compensations: Minimize environmental distractions;Slow rate;Small sips/bites Postural Changes: Seated upright at 90 degrees;Remain upright for at least 30 minutes after po intake    Other  Recommendations Oral Care Recommendations: Oral care QID;Staff/trained caregiver to provide oral care;Oral care prior to ice chip/H20    Recommendations for follow up therapy are one component of a multi-disciplinary discharge planning process, led by the attending physician.  Recommendations may be updated based on patient status, additional functional criteria and insurance authorization.  Follow up Recommendations Other (comment) (TBD)      Frequency and Duration min 2x/week  2 weeks       Prognosis Prognosis for Safe Diet Advancement: Good Barriers to Reach Goals: Cognitive deficits      Swallow Study   General Date of Onset: 08/17/21 HPI: 45 y.o. male admitted on 08/06/2021 with worsening back pain, constipation, abdominal distention . 10/17 incidence of coffee ground emesis with EGD revealing "esophagitis with no bleeding and a nonbleeding duodenal ulcer on Protonix". Pt developed acute hypoxic respiratory failure ETT 10/19-10/24. CXR (10/21) significant for "slightly improved extensive patchy airspace opacities in the left lung, compatible with pneumonia". PMhx: HTN, with a recent COVID-19 infection, macular degeneration, diabetes mellitus type 2,CKD stage III, recent admission for MSSA bacteremia/osteomyelitis status post T4-T5 laminectomy and spinal cord decompression on 08/07/2021. Type of Study: Bedside Swallow Evaluation Previous Swallow Assessment: none per EMR Diet Prior to this Study: NPO Temperature Spikes Noted: No Respiratory Status: Nasal cannula History of Recent  Intubation: Yes Length of Intubations (days): 6 days Date extubated: 08/17/21 Behavior/Cognition: Lethargic/Drowsy;Confused;Distractible;Requires cueing;Doesn't follow directions;Cooperative;Pleasant mood Oral Cavity Assessment: Within Functional Limits Oral Care Completed by SLP: Yes Oral Cavity - Dentition: Adequate natural dentition Vision:  (difficult to assess) Self-Feeding Abilities: Needs assist Patient Positioning: Upright in bed;Postural control adequate for testing Baseline Vocal Quality: Normal Volitional Cough: Cognitively unable to elicit Volitional Swallow: Unable to elicit    Oral/Motor/Sensory Function Overall Oral Motor/Sensory Function: Generalized oral weakness   Ice Chips Ice chips: Impaired Presentation: Spoon Oral Phase Impairments: Poor awareness of bolus Oral Phase Functional Implications: Prolonged oral transit Pharyngeal Phase Impairments: Cough - Delayed;Suspected delayed Swallow   Thin Liquid Thin Liquid: Impaired Presentation: Cup;Straw Pharyngeal  Phase Impairments: Cough - Delayed;Suspected delayed Swallow    Nectar Thick Nectar Thick Liquid: Not tested   Honey Thick Honey Thick Liquid: Not tested   Puree Puree: Impaired Presentation: Spoon Oral Phase Impairments: Poor awareness of bolus Oral Phase Functional Implications: Prolonged oral transit;Oral holding Pharyngeal Phase Impairments: Cough - Immediate   Solid     Solid: Not tested     Ellwood Dense, [place], [place] Office Number: [phone]  Acie Fredrickson 08/18/2021,10:31 AM

## 2021-08-18 NOTE — Progress Notes (Signed)
PHARMACY CONSULT NOTE FOR:  OUTPATIENT  PARENTERAL ANTIBIOTIC THERAPY (OPAT)  Indication: Vertebral Osteomyelitis   Regimen:  - Cefeazolin 2g IV Q8H (dosed base on improving renal function; reassess at discharge)   End date: 09/24/2021 (6 weeks from 08/13/21)  IV antibiotic discharge orders are pended. To discharging provider:  please sign these orders via discharge navigator,  Select New Orders & click on the button choice - Manage This Unsigned Work.     Thank you for allowing pharmacy to be a part of this patient's care.  Adria Dill, PharmD PGY-1 Acute Care Resident  08/18/2021 12:06 PM

## 2021-08-18 NOTE — Progress Notes (Signed)
Nutrition Follow-up  DOCUMENTATION CODES:   Not applicable  INTERVENTION:   Continue tube feeds via Cortrak: - Increase Vital 1.5 to 60 ml/hr (1440 ml/day) - ProSource TF 45 ml BID  Tube feeding regimen provides 2320 kcal, 119 grams of protein, and 1100 ml of H2O.   - MVI with minerals daily per tube  NUTRITION DIAGNOSIS:   Increased nutrient needs related to post-op healing as evidenced by estimated needs.  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 Pt was discharged home with abx but reports significant pain and inability to walk or sit-up since being discharged.  10/14 - s/p T4-T5 laminectomy, left T5 pediculotomy for decompression of spinal cord, intraoperative ultrasound, intraoperative microscope 10/16 - dark brown/red emesis 10/17 - s/p EGD showing esophagitis, hypoxia and PCCM consult, transferred to ICU 10/19 - intubated, Cortrak placed (tip gastric) 10/21 - s/p thoracentesis 10/24 - extubated  Discussed pt with RN and during ICU rounds. SLP evaluated pt this morning and recommended NPO. Pt tolerating current tube feeds via gastric Cortrak. Nephrology following; creatinine trending down. RD to adjust TF regimen to better meet pt's needs post-extubation.  Admit weight: 86.2 kg Current weight: 100.2 kg  Medications reviewed and include: aranesp weekly, diflucan, SSI q 4 hours, semglee 10 units BID, lactulose, melatonin, MVI with minerals, protonix, sucralfate, IV albumin, IV abx  Labs reviewed: BUN 106,  creatinine 3.73, elevated LFTs, hemoglobin 8.1 CBG's: 222-262 x 24 hours  UOP: 2235 ml x 24 hours I/O's: +6.7 L since admit  Diet Order:   Diet Order     None       EDUCATION NEEDS:   No education needs have been identified at this time  Skin:  Skin Assessment: Skin Integrity Issues: DTI: bilateral buttocks Stage II: coccyx Diabetic Ulcer: L big toe Incisions: back  Last BM:  08/17/21  Height:   Ht Readings from Last 1 Encounters:  08/06/21 _0  (1.803 m)    Weight:   Wt Readings from Last 1 Encounters:  08/18/21 100.1 kg    BMI:  Body mass index is 30.78 kg/m.  Estimated Nutritional Needs:   Kcal:  2150-2350  Protein:  110-130 grams  Fluid:  >2L/d    Gustavus Bryant, MS, RD, LDN Inpatient Clinical Dietitian Please see AMiON for contact information.

## 2021-08-18 NOTE — Progress Notes (Addendum)
Inpatient Diabetes Program Recommendations  AACE/ADA: New Consensus Statement on Inpatient Glycemic Control (2015)  Target Ranges:  Prepandial:   less than 140 mg/dL      Peak postprandial:   less than 180 mg/dL (1-2 hours)      Critically ill patients:  140 - 180 mg/dL   Lab Results  Component Value Date   GLUCAP 232 (H) 08/18/2021   HGBA1C 7.5 (A) 06/11/2021    Review of Glycemic Control Results for Jose [name], Jose [name] (MRN [id number]) as of 08/18/2021 09:30  Ref. Range 08/17/2021 19:35 08/17/2021 23:42 08/18/2021 03:37 08/18/2021 07:54  Glucose-Capillary Latest Ref Range: 70 - 99 mg/dL 225 (H) 262 (H) 222 (H) 232 (H)   Diabetes history: Type 2 DM Outpatient Diabetes medications: Trulicity 3 mg qwk, Amaryl 1 mg QD Current orders for Inpatient glycemic control: Novolog 0-20 units Q4H, Semglee 10 units BID, Novolog 8 units Q4H Vital @ 50 ml/hr   Inpatient Diabetes Program Recommendations:    If to remain on tube feeds, consider further increasing Semglee to 16 units BID.   Thanks, Bronson Curb, MSN, RNC-OB Diabetes Coordinator [phone] (8a-5p)

## 2021-08-19 DIAGNOSIS — M4624 Osteomyelitis of vertebra, thoracic region: Secondary | ICD-10-CM | POA: Diagnosis not present

## 2021-08-19 LAB — CBC
HCT: 19.1 % — ABNORMAL LOW (ref 39.0–52.0)
Hemoglobin: 5.9 g/dL — CL (ref 13.0–17.0)
MCH: 28.6 pg (ref 26.0–34.0)
MCHC: 30.9 g/dL (ref 30.0–36.0)
MCV: 92.7 fL (ref 80.0–100.0)
Platelets: 298 10*3/uL (ref 150–400)
RBC: 2.06 MIL/uL — ABNORMAL LOW (ref 4.22–5.81)
RDW: 16.2 % — ABNORMAL HIGH (ref 11.5–15.5)
WBC: 20.1 10*3/uL — ABNORMAL HIGH (ref 4.0–10.5)
nRBC: 0 % (ref 0.0–0.2)

## 2021-08-19 LAB — HEMOGLOBIN AND HEMATOCRIT, BLOOD
HCT: 25.2 % — ABNORMAL LOW (ref 39.0–52.0)
Hemoglobin: 7.9 g/dL — ABNORMAL LOW (ref 13.0–17.0)

## 2021-08-19 LAB — GLUCOSE, CAPILLARY
Glucose-Capillary: 133 mg/dL — ABNORMAL HIGH (ref 70–99)
Glucose-Capillary: 186 mg/dL — ABNORMAL HIGH (ref 70–99)
Glucose-Capillary: 202 mg/dL — ABNORMAL HIGH (ref 70–99)
Glucose-Capillary: 254 mg/dL — ABNORMAL HIGH (ref 70–99)
Glucose-Capillary: 264 mg/dL — ABNORMAL HIGH (ref 70–99)
Glucose-Capillary: 85 mg/dL (ref 70–99)

## 2021-08-19 LAB — BASIC METABOLIC PANEL
Anion gap: 12 (ref 5–15)
BUN: 123 mg/dL — ABNORMAL HIGH (ref 6–20)
CO2: 21 mmol/L — ABNORMAL LOW (ref 22–32)
Calcium: 9 mg/dL (ref 8.9–10.3)
Chloride: 117 mmol/L — ABNORMAL HIGH (ref 98–111)
Creatinine, Ser: 3.27 mg/dL — ABNORMAL HIGH (ref 0.61–1.24)
GFR, Estimated: 23 mL/min — ABNORMAL LOW (ref >60)
Glucose, Bld: 139 mg/dL — ABNORMAL HIGH (ref 70–99)
Potassium: 5 mmol/L (ref 3.5–5.1)
Sodium: 150 mmol/L — ABNORMAL HIGH (ref 135–145)

## 2021-08-19 LAB — COMPREHENSIVE METABOLIC PANEL
ALT: 46 U/L — ABNORMAL HIGH (ref 0–44)
AST: 107 U/L — ABNORMAL HIGH (ref 15–41)
Albumin: 2.3 g/dL — ABNORMAL LOW (ref 3.5–5.0)
Alkaline Phosphatase: 596 U/L — ABNORMAL HIGH (ref 38–126)
Anion gap: 7 (ref 5–15)
BUN: 130 mg/dL — ABNORMAL HIGH (ref 6–20)
CO2: 23 mmol/L (ref 22–32)
Calcium: 8.8 mg/dL — ABNORMAL LOW (ref 8.9–10.3)
Chloride: 116 mmol/L — ABNORMAL HIGH (ref 98–111)
Creatinine, Ser: 3.53 mg/dL — ABNORMAL HIGH (ref 0.61–1.24)
GFR, Estimated: 21 mL/min — ABNORMAL LOW (ref >60)
Glucose, Bld: 281 mg/dL — ABNORMAL HIGH (ref 70–99)
Potassium: 6.1 mmol/L — ABNORMAL HIGH (ref 3.5–5.1)
Sodium: 146 mmol/L — ABNORMAL HIGH (ref 135–145)
Total Bilirubin: 2.4 mg/dL — ABNORMAL HIGH (ref 0.3–1.2)
Total Protein: 6.1 g/dL — ABNORMAL LOW (ref 6.5–8.1)

## 2021-08-19 LAB — PREPARE RBC (CROSSMATCH)

## 2021-08-19 LAB — PROCALCITONIN: Procalcitonin: 0.51 ng/mL

## 2021-08-19 MED ORDER — SODIUM CHLORIDE 0.9 % IV SOLN
20.0000 ug | Freq: Once | INTRAVENOUS | Status: AC
  Administered 2021-08-19: 20 ug via INTRAVENOUS
  Filled 2021-08-19: qty 5

## 2021-08-19 MED ORDER — HYDRALAZINE HCL 20 MG/ML IJ SOLN: 10.0000 mg | Freq: Four times a day (QID) | INTRAMUSCULAR | Status: DC

## 2021-08-19 MED ORDER — HYDRALAZINE HCL 20 MG/ML IJ SOLN
10.0000 mg | Freq: Four times a day (QID) | INTRAMUSCULAR | Status: DC | PRN
  Administered 2021-08-20 – 2021-08-21 (×3): 10 mg via INTRAVENOUS
  Filled 2021-08-19 (×3): qty 1

## 2021-08-19 MED ORDER — INSULIN GLARGINE-YFGN 100 UNIT/ML ~~LOC~~ SOLN
15.0000 [IU] | Freq: Two times a day (BID) | SUBCUTANEOUS | Status: DC
  Filled 2021-08-19 (×2): qty 0.15

## 2021-08-19 MED ORDER — DEXTROSE 5 % IV SOLN: INTRAVENOUS | Status: DC

## 2021-08-19 MED ORDER — SODIUM CHLORIDE 0.9 % IV SOLN
3.0000 g | Freq: Three times a day (TID) | INTRAVENOUS | Status: DC
  Administered 2021-08-19 – 2021-08-21 (×5): 3 g via INTRAVENOUS
  Filled 2021-08-19 (×5): qty 8

## 2021-08-19 MED ORDER — SODIUM CHLORIDE 0.9% IV SOLUTION: Freq: Once | INTRAVENOUS | Status: AC

## 2021-08-19 MED ORDER — METOPROLOL TARTRATE 25 MG PO TABS
25.0000 mg | ORAL_TABLET | Freq: Two times a day (BID) | ORAL | Status: DC
  Administered 2021-08-19 – 2021-08-24 (×10): 25 mg via NASOGASTRIC
  Filled 2021-08-19 (×10): qty 1

## 2021-08-19 MED ORDER — SODIUM ZIRCONIUM CYCLOSILICATE 10 G PO PACK
10.0000 g | PACK | Freq: Once | ORAL | Status: AC
  Administered 2021-08-19: 10 g
  Filled 2021-08-19: qty 1

## 2021-08-19 MED ORDER — SODIUM ZIRCONIUM CYCLOSILICATE 10 G PO PACK
10.0000 g | PACK | Freq: Three times a day (TID) | ORAL | Status: DC
  Administered 2021-08-19 (×3): 10 g
  Filled 2021-08-19 (×3): qty 1

## 2021-08-19 MED ORDER — PANTOPRAZOLE SODIUM 40 MG IV SOLR
40.0000 mg | Freq: Two times a day (BID) | INTRAVENOUS | Status: DC
  Administered 2021-08-19 – 2021-08-20 (×4): 40 mg via INTRAVENOUS
  Filled 2021-08-19 (×4): qty 40

## 2021-08-19 MED ORDER — SODIUM CHLORIDE 0.9 % IV SOLN: 3.0000 g | Freq: Two times a day (BID) | INTRAVENOUS | Status: DC

## 2021-08-19 MED ORDER — METOPROLOL TARTRATE 5 MG/5ML IV SOLN
5.0000 mg | Freq: Four times a day (QID) | INTRAVENOUS | Status: DC
  Administered 2021-08-20 (×2): 5 mg via INTRAVENOUS
  Filled 2021-08-19 (×3): qty 5

## 2021-08-19 MED ORDER — FLUCONAZOLE IN SODIUM CHLORIDE 200-0.9 MG/100ML-% IV SOLN
200.0000 mg | INTRAVENOUS | Status: DC
  Administered 2021-08-19: 200 mg via INTRAVENOUS
  Filled 2021-08-19 (×2): qty 100

## 2021-08-19 MED ORDER — INSULIN GLARGINE-YFGN 100 UNIT/ML ~~LOC~~ SOLN
10.0000 [IU] | Freq: Two times a day (BID) | SUBCUTANEOUS | Status: DC
  Administered 2021-08-19 (×2): 10 [IU] via SUBCUTANEOUS
  Filled 2021-08-19 (×5): qty 0.1

## 2021-08-19 MED ORDER — SODIUM CHLORIDE 0.9 % IV SOLN
3.0000 g | Freq: Three times a day (TID) | INTRAVENOUS | Status: DC
  Administered 2021-08-19: 3 g via INTRAVENOUS
  Filled 2021-08-19: qty 8

## 2021-08-19 MED ORDER — LORAZEPAM 2 MG/ML IJ SOLN
0.5000 mg | Freq: Four times a day (QID) | INTRAMUSCULAR | Status: DC
  Administered 2021-08-19 – 2021-08-23 (×15): 0.5 mg via INTRAVENOUS
  Filled 2021-08-19 (×16): qty 1

## 2021-08-19 NOTE — Progress Notes (Signed)
Physical Therapy Treatment Patient Details Name: Jose [name] MRN: [id number] DOB: 22-Jan-1976 Today's Date: 08/19/2021   History of Present Illness 45 y.o. male admitted on 08/06/2021 with worsening back pain, S/P T4-5 laminectomy, LT5 pediculotomy, and decompression of spinal cord on 10/14.  Pt with acute hypoxic respiratory failure in setting of recent GI bleed and recent spinal surgery. Endoscopy 10/17. Intubated 10/19-10/24. PMhx: T2DM, retinopathy, neuropathy, HTN, HLD, CKD, covid July 2022.    PT Comments    Patient progressing slowly.  Sitting longer at EOB today and able to engage with oral care and ice chips.  Patient perseverating on ice chips throughout session and with multiple attempts at communication, but mostly incoherent.  Able to respond at times spontaneously, but not accurately (stating date as Oct 1922).  Patient with significant postural weakness and weakness in extremities needing max A for sitting EOB and +2 total A for all other mobility.  Feel he will benefit from inpatient rehab prior to home.  PT to continue to follow acutely.   Recommendations for follow up therapy are one component of a multi-disciplinary discharge planning process, led by the attending physician.  Recommendations may be updated based on patient status, additional functional criteria and insurance authorization.  Follow Up Recommendations  Acute inpatient rehab (3hours/day)     Assistance Recommended at Discharge Frequent or constant Supervision/Assistance  Equipment Recommendations  Other (comment) (TBA)    Recommendations for Other Services       Precautions / Restrictions Precautions Precautions: Fall;Back Precaution Comments: cortrak Restrictions Weight Bearing Restrictions: No     Mobility  Bed Mobility Overal bed mobility: Needs Assistance Bed Mobility: Rolling;Sidelying to Sit;Sit to Sidelying Rolling: +2 for physical assistance;+2 for safety/equipment;Total  assist Sidelying to sit: +2 for physical assistance;Total assist;+2 for safety/equipment     Sit to sidelying: Total assist;+2 for physical assistance;+2 for safety/equipment General bed mobility comments: total assist to roll and transition to EOB, poor initation and sequencing    Transfers Overall transfer level: Needs assistance Equipment used: None Transfers: Lateral/Scoot Transfers            Lateral/Scoot Transfers: +2 physical assistance;Total assist General transfer comment: scooting toward HOB using pad under hips with assist for anterior weight shift for using legs for scooting pt helping slightly with weight bearing second scoot    Ambulation/Gait                 Stairs             Wheelchair Mobility    Modified Rankin (Stroke Patients Only)       Balance Overall balance assessment: Needs assistance Sitting-balance support: Feet supported;No upper extremity supported Sitting balance-Leahy Scale: Zero Sitting balance - Comments: max A for sitting balance; no attempt for self support with UE's and pt with kyphotic posture, with flexed head and neck and weak interscapular musculature attempted stretching up and holding momentarily with "chest out shoulders back"                                    Cognition Arousal/Alertness: Lethargic Behavior During Therapy: Flat affect Overall Cognitive Status: Impaired/Different from baseline Area of Impairment: Attention;Following commands;Memory;Safety/judgement;Awareness;Problem solving;Orientation                 Orientation Level: Disoriented to;Time Current Attention Level: Focused Memory: Decreased short-term memory;Decreased recall of precautions Following Commands: Follows one step commands inconsistently;Follows one step commands  with increased time Safety/Judgement: Decreased awareness of safety;Decreased awareness of deficits Awareness: Emergent Problem Solving: Slow  processing;Decreased initiation;Difficulty sequencing;Requires verbal cues;Requires tactile cues General Comments: following simple one step commands with increased time and cues, perseverating on ice chips at EOB and has mumbled speech very difficult to understand at times        Exercises Other Exercises Other Exercises: seated scap retraction x 2-3 Other Exercises: seated LAQ several sometimes spontaneous    General Comments General comments (skin integrity, edema, etc.): on 6L [place], VSS throughout BP 165/80 in sitting; while at EOB OT working for pt to assist with oral care and taking ice chips      Pertinent Vitals/Pain Pain Assessment: Faces Faces Pain Scale: Hurts little more Pain Location: generalized with mobility Pain Descriptors / Indicators: Grimacing;Guarding Pain Intervention(s): Monitored during session;Repositioned    Home Living                          Prior Function            PT Goals (current goals can now be found in the care plan section) Progress towards PT goals: Progressing toward goals    Frequency    Min 3X/week      PT Plan Current plan remains appropriate    Co-evaluation PT/OT/SLP Co-Evaluation/Treatment: Yes Reason for Co-Treatment: Complexity of the patient's impairments (multi-system involvement);For patient/therapist safety;To address functional/ADL transfers PT goals addressed during session: Balance;Mobility/safety with mobility;Strengthening/ROM OT goals addressed during session: ADL's and self-care      AM-PAC PT "6 Clicks" Mobility   Outcome Measure  Help needed turning from your back to your side while in a flat bed without using bedrails?: Total Help needed moving from lying on your back to sitting on the side of a flat bed without using bedrails?: Total Help needed moving to and from a bed to a chair (including a wheelchair)?: Total Help needed standing up from a chair using your arms (e.g., wheelchair or  bedside chair)?: Total Help needed to walk in hospital room?: Total Help needed climbing 3-5 steps with a railing? : Total 6 Click Score: 6    End of Session Equipment Utilized During Treatment: Oxygen Activity Tolerance: Patient limited by fatigue Patient left: in bed;with call bell/phone within reach;with bed alarm set Nurse Communication: Mobility status PT Visit Diagnosis: Other abnormalities of gait and mobility (R26.89);Muscle weakness (generalized) (M62.81)     Time: 2620-3559 PT Time Calculation (min) (ACUTE ONLY): 39 min  Charges:  $Therapeutic Activity: 8-22 mins                     Jose [name], PT Acute Rehabilitation Services RCBUL:[phone] OZYYQM:[phone] 08/19/2021    Reginia Naas 08/19/2021, 4:23 PM

## 2021-08-19 NOTE — Consult Note (Signed)
NAME:  Jose [name], MRN:  [id number], DOB:  1976/10/11, LOS: 44 ADMISSION DATE:  08/06/2021, CONSULTATION DATE:  08/10/2021 REFERRING MD:  TRH, CHIEF COMPLAINT:  /aspiration/anemia    BRIEF HISTORY:  Jose [name] is a 45 y.o. 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 On 10/17 patient developed coffee-ground emesis, now s/p EGD showing circumferential esophagitis with hemorrhage and non-bleeding duodenal ulcers now on BID PPI. On 10/17, pccm was consulted for acute hypoxia.   PCCM reconsulted due to respiratory distress and acute drop in Hgb to 5.8. PAST MEDICAL HISTORY:   has a past medical history of GAD (generalized anxiety disorder), Hyperlipidemia, Macular degeneration, bilateral, Retinopathy, and Type II diabetes mellitus with complication, uncontrolled. SIGNIFICANT HOSPITAL EVENTS:  10/13 -admitted with worsening thoracic osteomyelitis, constipation/abd distention, neurosurgery consulted  10/14 - T4-T5 laminectomy, left T5 pediculotomy, and spinal cord decompression 10/17 - Hgb drop to 6.2, coffee ground emesis s/p EGD with esophagitis and peptic ulcer 10/17 - hypoxia and pccm consult  10/19 - Intubated 10/21 - S/p Thoracentesis   10/24 - Extubated 10/26 - PCCM re-consulted for acute drop in Hgb, no blood in stools  CONSULTS:  Neurosurgery Infection disease PCCM PROCEDURES:  10/14 - T4-T5 laminectomy, left T5 pediculotomy, and spinal cord decompression 10/17 - EGD 10/19 - Intubation 10/21 - thoracentesis  SIGNIFICANT DIAGNOSTIC TESTS:   CT Abd/Pelvis: Result Date:  08/06/2021 IMPRESSION: 1. Generally large burden of stool and stool balls throughout the colon. 2. Distended urinary bladder, measuring up to 19.0 cm. Correlate for urinary retention. 3. Anasarca.   CTA: Result Date: 08/06/2021 1. Negative examination for pulmonary embolism. 2. Moderate bilateral pleural effusions and associated atelectasis or consolidation, new compared to prior examination. 3. There is substantial interval worsening of destructive lytic change about the T4-T5 disc space. Overlying paravertebral soft tissue thickening. Findings are consistent with worsened discitis osteomyelitis but in general better assessed by same-day MR. 4. Unchanged enlarged right hilar and subcarinal lymph nodes, likely reactive. 5. Cardiomegaly and coronary artery disease.   MR Thoracic Spine: Result Date: 08/06/2021 1. Redemonstrated abnormal bone marrow signal and enhancement involving the T4 and T5 levels with increased surrounding paraspinal phlegmon and new epidural phlegmon (6 mm thick ventrally). Interval T5 pathologic fracture (50-60% height loss) with new 4 mm of bony retropulsion. Constellation of findings results in new moderate to severe left eccentric canal stenosis at T4 with cord deformity. 2. Overall, findings are most likely secondary to progressive discitis/osteomyelitis. Superimposed osseous metastatic disease or multiple myeloma is not excluded by imaging. Recommend correlation with the results of reported recent biopsy. 3. Multiple small (subcentimeter) fluid collections in the left posterior paraspinal phlegmon could represent tiny abscesses   DG CHEST PORT 1 VIEW Result Date: 08/10/2021 IMPRESSION: Extensive airspace opacity throughout the left lung field. Comparatively mild opacity within the right lung base. Given the asymmetry, these findings likely reflect multifocal pneumonia. Small left pleural effusion.    EGD: Result Date: 08/10/2021 LA Grade D (one or more mucosal  breaks involving at least 75% of esophageal circumference) esophagitis with no bleeding was found in the entire esophagus. Biopsies were taken with a cold forceps for histology. Findings: Hematin (altered blood/coffee-ground-like material) was  found in the gastric body. No gross lesions were noted in the entire examined stomach. Biopsies were taken with a cold forceps for histology. Few non-bleeding superficial duodenal ulcers with no stigmata of bleeding were found in the duodenal bulb. The largest lesion was 15 mm in largest dimension. Biopsies were taken with a cold forceps for histology. The second portion of the duodenum was normal.   CT HEAD WO CONTRAST (5MM) Result Date: 08/11/2021 IMPRESSION: No acute intracranial process.    DG CHEST PORT 1 VIEW Result Date: 08/11/2021 IMPRESSION: No change from the prior study.   DG CHEST PORT 1 VIEW  Result Date: 08/18/2021 Low lung volumes with persistent left lung pneumonia. Worsening of atelectasis/consolidation at the lung bases. Possible small left pleural effusion.   MICRO DATA:  10/13 - Bcx no growth x5d 10/13 - Ucx no growth 10/18 - resp culture pending 10/21 - pleural cx negative   ANTIMICROBIALS:  Cefazolin 10/13> Fluconazole 10/20> Unasyn 10/18-10/24 Cefepime 10/13-10/13  Zosyn 10/17-10/17  Vancomycin 10/17-10  INTERIM HISTORY/SUBJECTIVE:  Overnight events: Patient had an acute drop in hgb.   Patient resting in bed. He is alert and orient, but appears anxious stating that he is concerned about his need for supplemental oxygen. He is having some mild diffuse abdominal discomfort but otherwise, denies and significant abdominal pain.  OBJECTIVE:  Blood pressure (!) 144/64, pulse (!) 102, temperature 99.9 F (37.7 C), temperature source Axillary, resp. rate (!) 22, height 5' 11"  (1.803 m), weight 92 kg, SpO2 93 %.        Intake/Output Summary (Last 24 hours) at 08/19/2021 5462 Last data filed at 08/19/2021  0700 Gross per 24 hour  Intake 1807.61 ml  Output 1675 ml  Net 132.61 ml   Filed Weights   08/17/21 0500 08/18/21 0238 08/19/21 0400  Weight: 97.7 kg 100.1 kg 92 kg    Examination: Physical Exam Constitutional:      Appearance: He is ill-appearing and diaphoretic.  HENT:     Head: Normocephalic and atraumatic.     Mouth/Throat:     Mouth: Mucous membranes are dry.     Pharynx: Oropharynx is clear.  Eyes:     Extraocular Movements: Extraocular movements intact.     Pupils: Pupils are equal, round, and reactive to light.  Cardiovascular:     Rate and Rhythm: Regular rhythm. Tachycardia present.     Pulses: Normal pulses.     Heart sounds: Normal heart sounds.  Pulmonary:     Effort: Pulmonary effort is normal. Tachypnea present.  Abdominal:     General: Bowel sounds are normal. There is distension.     Palpations: Abdomen is soft. There is no mass.     Tenderness: There is no abdominal tenderness. There is no guarding.  Musculoskeletal:        General: No swelling or tenderness. Normal range of motion.     Cervical back: Rigidity (without neck pain) present.  Skin:    General: Skin is warm.     Capillary Refill: Capillary refill takes more than 3 seconds.     Coloration: Skin is pale.  Neurological:     General: No focal deficit present.     Mental Status: He is alert.     Motor: Weakness (generalized weakness) present.   Patient Lines/Drains/Airways Status     Active Line/Drains/Airways     Name Placement date Placement time Site Days   PICC Single Lumen 07/31/21 Right Brachial 40 cm 0 cm 07/31/21  1034  Brachial  19   Urethral Catheter Meghan, RN Straight-tip 16 Fr. 08/14/21  1428  Straight-tip  5   Incision (Closed) 08/07/21 Back 08/07/21  1803  -- 12   Small Bore Feeding Tube 10 Fr. Left nare Marking at nare/corner of mouth 69 cm 08/12/21  1133  Left nare  7   Pressure Injury 08/10/21 Coccyx Medial Stage 2 -  Partial thickness loss of dermis presenting as a  shallow open injury with a red, pink wound bed without slough. 08/10/21  1700  -- 9   Pressure Injury 08/14/21 Buttocks Bilateral Deep Tissue Pressure Injury - Purple or maroon localized area of discolored intact skin or blood-filled blister due to damage of underlying soft tissue from pressure and/or shear. DTI 08/14/21  0800  -- 5   Wound / Incision (Open or Dehisced) 07/22/21 Diabetic ulcer Toe (Comment  which one) Left Big toe ulcer 07/22/21  --  Toe (Comment  which one)  [place]:    ASSESSMENT & PLAN:  Persistent Respiratory distress, multifactorial Decreased lung volumes and atelectasis 2/2 to pain  Possible persistent pneumonia/aspiration pneumonitis, worse left-sided consolidation  Patient having some mild respiratory distress, but is saturating well on 6L . Denies any thick secretions or cough, but did have an episode of emesis yesterday concerning for aspiration. He does have distant lung sounds worse on the left, which is consistent with is consolidation/atelectasis.  Persistent leukocytosis but trending downward slowly with fever as high as 100.9 F yesterday. Does not appear to be fluid overloaded, but may benefit from NIPPV qHS.  - Cont supplemental O2 as needed - ABG if requires more O2 - Cont IV AB per ID, consider broadening  - Will follow along in case of worsening respiratory distress.  - hold oral medications and tube feeds  Iron deficiency anemia: Candidal esophagitis and PUD s/p EGD Patient had an acute drop in hgb from 8.1 to 5.9. receiving 1 unit PRBC. Seems likely secondary to his underlying esophagitis and PUD. No evidence of melana/hematochezia. No significant abdominal pain. May need additional abdominal imaging if symptoms persist.  - transfuse 1 unit PRBC - Hold DVT ppx, continue SCDs - Recheck CBC post transfusion, if not appropriate consider abdominal imaging.  - If he starts to develop melana or hematochezia and persistent  hgb< 7 then will  need repeat EGD. 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  - Transition to IV pain and antispasmodic medications for now.   T2DM: - On SSI prn - Management per primary team  ATN on CKD3a: Management per nephrology  HTN: - Transition oral HTN medications ot IV when possible   BEST PRACTICE:  Diet: NPO Pain/Anxiety/Delirium protocol (if indicated): yes,  VAP protocol (if indicated): yes DVT prophylaxis: SCD GI prophylaxis: PPI Glucose control: SSI Yes Lines: N/A Foley:  Yes, and it is still needed Mobility: bed rest Code Status: full code Family Communication: partner at bedside Disposition: Stepdown  Labs   CBC: Recent Labs  Lab 08/13/21 0415 08/13/21 1041 08/14/21 0801 08/15/21 0545 08/16/21 0454 08/17/21 0400 08/18/21 0334 08/19/21 0430 08/19/21 0520  WBC 35.2*  --  26.3* 22.0* 22.0* 21.4* 21.7* 20.1*  --   NEUTROABS 30.2*  --  21.3*  --   --   --   --   --   --   HGB 7.1*   < > 8.1*  8.0* 8.6* 8.3* 8.1* 5.9* 5.8*  HCT 21.8*   < > 25.8* 25.4* 27.2* 26.5* 26.1* 19.1* 18.1*  MCV 89.7  --  90.5 89.4 91.0 90.8 92.6 92.7  --   PLT 412*  --  381 377 381 352 360 298  --    < > = values in this interval not displayed.    Basic Metabolic Panel: Recent Labs  Lab 08/13/21 0415 08/13/21 1041 08/13/21 1700 08/14/21 0415 08/14/21 0801 08/14/21 1700 08/15/21 0545 08/16/21 0454 08/17/21 0400 08/18/21 0334 08/19/21 0430  NA 140   < >  --   --    < > 137 140 138 141 142 146*  K 3.9   < >  --   --    < > 4.8 4.6 4.7 5.0 5.0 6.1*  CL 109  --   --   --    < > 105 107 107 107 111 116*  CO2 22  --   --   --    < > 21* 22 21* 21* 21* 23  GLUCOSE 208*  --   --   --    < > 365* 225* 139* 125* 244* 281*  BUN 55*  --   --   --    < > 85* 97* 107* 113* 106* 130*  CREATININE 1.89*  --   --   --    < > 3.78* 4.23* 4.43* 4.53* 3.73* 3.53*  CALCIUM 8.4*  --   --   --    < > 8.2* 8.3* 8.1* 8.4* 8.9 8.8*   MG 2.2  --  2.4 2.5*  --  2.6* 2.6*  --   --   --   --   PHOS 3.8  --  3.5 4.2  --  3.7 3.2  --   --   --   --    < > = values in this interval not displayed.   GFR: Estimated Creatinine Clearance: 30 mL/min (A) (by C-G formula based on SCr of 3.53 mg/dL (H)). Recent Labs  Lab 08/13/21 0415 08/14/21 0801 08/16/21 0454 08/17/21 0400 08/18/21 0334 08/19/21 0430  PROCALCITON 3.29  --   --   --   --   --   WBC 35.2*   < > 22.0* 21.4* 21.7* 20.1*   < > = values in this interval not displayed.    Liver Function Tests: Recent Labs  Lab 08/15/21 0545 08/16/21 0454 08/17/21 0400 08/18/21 0334 08/19/21 0430  AST 580* 253* 155* 74* 107*  ALT 211* 157* 114* 77* 46*  ALKPHOS 804* 836* 715* 636* 596*  BILITOT 1.7* 2.3* 2.8* 3.0* 2.4*  PROT 5.7* 5.5* 5.9* 6.3* 6.1*  ALBUMIN <1.5* <1.5* <1.5* 2.1* 2.3*   No results for input(s): LIPASE, AMYLASE in the last 168 hours. No results for input(s): AMMONIA in the last 168 hours.  ABG    Component Value Date/Time   PHART 7.388 08/13/2021 1041   PCO2ART 38.6 08/13/2021 1041   PO2ART 94 08/13/2021 1041   HCO3 23.0 08/13/2021 1041   TCO2 24 08/13/2021 1041   ACIDBASEDEF 2.0 08/13/2021 1041   O2SAT 97.0 08/13/2021 1041     Coagulation Profile: No results for input(s): INR, PROTIME in the last 168 hours.  Cardiac Enzymes: Recent Labs  Lab 08/14/21 1700  CKTOTAL 25*    HbA1C: Hemoglobin A1C  Date/Time Value Ref Range Status  06/11/2021 07:49 AM 7.5 (A) 4.0 - 5.6 % Final  03/10/2021 09:39 AM 7.0 (  A) 4.0 - 5.6 % Final   Hgb A1c MFr Bld  Date/Time Value Ref Range Status  05/19/2021 04:31 AM 7.7 (H) 4.8 - 5.6 % Final    Comment:    (NOTE)         Prediabetes: 5.7 - 6.4         Diabetes: >6.4         Glycemic control for adults with diabetes: <7.0   11/22/2019 10:12 AM 7.2 (H) 4.8 - 5.6 % Final    Comment:             Prediabetes: 5.7 - 6.4          Diabetes: >6.4          Glycemic control for adults with diabetes: <7.0      CBG: Recent Labs  Lab 08/18/21 1522 08/18/21 1929 08/18/21 2314 08/19/21 0332 08/19/21 0744  GLUCAP 184* 210* 207* 264* 202*    Review of Systems:   See above  Past Medical History  He,  has a past medical history of GAD (generalized anxiety disorder), Hyperlipidemia, Macular degeneration, bilateral, Retinopathy, and Type II diabetes mellitus with complication, uncontrolled.   Surgical History    Past Surgical History:  Procedure Laterality Date   APPENDECTOMY     BIOPSY  08/10/2021   Procedure: BIOPSY;  Surgeon: Otis Brace, MD;  Location: [place] [facility];  Service: Gastroenterology;;   BUBBLE STUDY  07/29/2021   Procedure: BUBBLE STUDY;  Surgeon: Jerline Pain, MD;  Location: [place] [facility];  Service: Cardiovascular;;   ESOPHAGOGASTRODUODENOSCOPY (EGD) WITH PROPOFOL N/A 08/10/2021   Procedure: ESOPHAGOGASTRODUODENOSCOPY (EGD) WITH PROPOFOL;  Surgeon: Otis Brace, MD;  Location: [place];  Service: Gastroenterology;  Laterality: N/A;   HERNIA REPAIR     IR FLUORO GUIDED NEEDLE PLC ASPIRATION/INJECTION LOC  07/28/2021   LUMBAR LAMINECTOMY/DECOMPRESSION MICRODISCECTOMY N/A 08/07/2021   Procedure: THORACIC FOUR - THORACIC FIVE LAMINECTOMY/DECOMPRESSION OF SPINAL CORD, DEBRIDEMENT OF ABSCESS, MICRODISCECTOMY, INTRAOPERATIVE ULTRASOUND;  Surgeon: Consuella Lose, MD;  Location: [place];  Service: Neurosurgery;  Laterality: N/A;   TEE WITHOUT CARDIOVERSION N/A 07/29/2021   Procedure: TRANSESOPHAGEAL ECHOCARDIOGRAM (TEE);  Surgeon: Jerline Pain, MD;  Location: [facility];  Service: Cardiovascular;  Laterality: N/A;   TRIGGER FINGER RELEASE Right 10/25/2019   Procedure: RIGHT INDEX FINGER RELEASE TRIGGER FINGER/A-1 PULLEY;  Surgeon: Daryll Brod, MD;  Location: [place];  Service: Orthopedics;  Laterality: Right;  IV REGIONAL FOREARM BLOCK     Social History   reports that he quit smoking about 12 months ago. His smoking use included cigarettes. He  has a 20.00 pack-year smoking history. He has never used smokeless tobacco. He reports that he does not currently use alcohol. He reports that he does not use drugs.   Family History   His family history includes ADD / ADHD in his brother and son; Diabetes in his father and mother; Hyperlipidemia in his brother and mother; Stroke in his brother and mother.   Allergies Allergies  Allergen Reactions   Cranberry Itching     Home Medications  Prior to Admission medications   Medication Sig Start Date End Date Taking? Authorizing Provider  acetaminophen (TYLENOL) 500 MG tablet Take 500 mg by mouth every 6 (six) hours as needed for mild pain, fever or headache.   Yes [provider]  albuterol (VENTOLIN HFA) 108 (90 Base) MCG/ACT inhaler Inhale 1 puff into the lungs every 4 (four) hours as needed for wheezing or shortness of breath. 05/27/21  Yes  [provider]  ALPRAZolam Duanne Moron) 0.25 MG tablet Take 0.25 mg by mouth 2 (two) times daily as needed for anxiety. 06/12/21  Yes [provider]  amLODipine (NORVASC) 10 MG tablet Take 1 tablet (10 mg total) by mouth daily. 08/03/21  Yes Cristal Deer, MD  Ascorbic Acid (VITAMIN C PO) Take 1 tablet by mouth daily.   Yes [provider]  atorvastatin (LIPITOR) 40 MG tablet Take 1 tablet (40 mg total) by mouth daily. 03/12/19  Yes Jacelyn Pi, Lilia Argue, MD  ceFAZolin (ANCEF) IVPB Inject 2 g into the vein every 8 (eight) hours. Indication:  Osteomyelitis  First Dose: No Last Day of Therapy:  09/04/2022  Labs - Once weekly:  CBC/D and BMP, Labs - Every other week:  ESR and CRP Method of administration: IV Push Method of administration may be changed at the discretion of home infusion pharmacist based upon assessment of the patient and/or caregiver's ability to self-administer the medication ordered. 07/31/21  Yes Cristal Deer, MD  chlorproMAZINE (THORAZINE) 25 MG tablet Take 1 tablet (25 mg total) by mouth 3 (three)  times daily as needed for hiccoughs. 08/03/21  Yes Cristal Deer, MD  collagenase (SANTYL) ointment Apply topically daily. 08/03/21  Yes Cristal Deer, MD  cyclobenzaprine (FLEXERIL) 10 MG tablet Take 1 tablet (10 mg total) by mouth 3 (three) times daily. 08/03/21  Yes Cristal Deer, MD  Dulaglutide (TRULICITY) 3 AS/5.0NL SOPN Inject 3 mg as directed once a week. Patient taking differently: Inject 3 mg as directed once a week. thursday 06/11/21  Yes Renato Shin, MD  feeding supplement, GLUCERNA SHAKE, (GLUCERNA SHAKE) LIQD Take 237 mLs by mouth 3 (three) times daily between meals. 08/03/21  Yes Cristal Deer, MD  gabapentin (NEURONTIN) 300 MG capsule TAKE 1 TO 2 CAPSULES(300 TO 600 MG) BY MOUTH AT BEDTIME Patient taking differently: Take 300 mg by mouth 2 (two) times daily. 12/01/19  Yes Jacelyn Pi, Lilia Argue, MD  glimepiride (AMARYL) 1 MG tablet Take 1 tablet (1 mg total) by mouth daily with breakfast. 01/08/21  Yes Renato Shin, MD  hydrALAZINE (APRESOLINE) 50 MG tablet Take 1 tablet (50 mg total) by mouth 3 (three) times daily. 08/03/21  Yes Cristal Deer, MD  lactulose (CHRONULAC) 10 GM/15ML solution Take 30 mLs (20 g total) by mouth 2 (two) times daily. 08/03/21  Yes Cristal Deer, MD  Multiple Vitamin (MULTIVITAMIN ADULT PO) Take 1 tablet by mouth daily.   Yes [provider]  mupirocin ointment (BACTROBAN) 2 % Apply 1 application topically 2 (two) times daily. 08/03/21  Yes Cristal Deer, MD  Oxycodone HCl 10 MG TABS Take 10 mg by mouth every 3 (three) hours as needed for pain. 08/03/21  Yes [provider]  senna-docusate (SENOKOT-S) 8.6-50 MG tablet Take 1 tablet by mouth 2 (two) times daily. 08/03/21  Yes Cristal Deer, MD  tadalafil (CIALIS) 20 MG tablet TK 1 T PO  PO QD PRN FOR ERECTILE DYSFUNCTION Patient taking differently: Take 20 mg by mouth daily as needed for erectile dysfunction. 07/20/19  Yes Jacelyn Pi, Irma M, MD  tobramycin (TOBREX)  0.3 % ophthalmic solution Place 1 drop into both eyes See admin instructions. Begin 1 day prior to treatment and continue the day of treatment and for one full day after treatment. 07/05/18  Yes [provider]  traZODone (DESYREL) 100 MG tablet TAKE 1 TABLET(100 MG) BY MOUTH AT BEDTIME AS NEEDED FOR SLEEP Patient taking differently: Take 100 mg by mouth at bedtime. 03/05/21  Yes Ravi, Himabindu, MD  VITAMIN D PO Take 1 tablet by mouth daily.   Yes [provider]  DULoxetine (CYMBALTA) 60 MG capsule TAKE 1 CAPSULE(60 MG) BY MOUTH DAILY Patient not taking: No sig reported 03/05/21   Einar Grad, Himabindu, MD  Ranibizumab (LUCENTIS) 0.3 MG/0.05ML SOLN 1 Dose by Intravitreal route every 3 (three) months.    [provider]     Critical care time: N/A    [name], D.O.  Internal Medicine Resident, PGY-3 [facility]  Pager: [phone] 8:22 AM, 08/19/2021

## 2021-08-19 NOTE — Progress Notes (Signed)
Speech Language Pathology Treatment: Dysphagia  Patient Details Name: Jose [name] MRN: [id number] DOB: 06-11-1976 Today's Date: 08/19/2021 Time: 2878-6767 SLP Time Calculation (min) (ACUTE ONLY): 16 min  Assessment / Plan / Recommendation Clinical Impression  Pt was seen for dysphagia treatment. Pt was alert and cooperative during the session. He communicated verbally, but meaning was reduced and speech intelligibility was impacted by articulatory imprecision. Pt demonstrated a right lateral head tilt and lean; positioning was improved with use of pillows, but remained suboptimal. Pt tolerated puree solids, regular texture solids, and thin liquids via cup and straw without overt s/sx of aspiration. Bolus awareness was reduced with more advanced solids and he exhibited prolonged and inconsistent mastication with moderate oral residue was noted. Oral residue was improved with multiple liquid washes. A dysphagia 1 diet with thin liquids will be initiated at this time. SLP will follow to ensure diet tolerance and for diet advancement as clinically indicated. '  HPI HPI: 45 y.o. male admitted on 08/06/2021 with worsening back pain, constipation, abdominal distention . 10/17 incidence of coffee ground emesis with EGD revealing "esophagitis with no bleeding and a nonbleeding duodenal ulcer on Protonix". Pt developed acute hypoxic respiratory failure ETT 10/19-10/24. CXR (10/21) significant for "slightly improved extensive patchy airspace opacities in the left lung, compatible with pneumonia". PMhx: HTN, with a recent COVID-19 infection, macular degeneration, diabetes mellitus type 2,CKD stage III, recent admission for MSSA bacteremia/osteomyelitis status post T4-T5 laminectomy and spinal cord decompression on 08/07/2021.      SLP Plan  Continue with current plan of care      Recommendations for follow up therapy are one component of a multi-disciplinary discharge planning process, led by the  attending physician.  Recommendations may be updated based on patient status, additional functional criteria and insurance authorization.    Recommendations  Diet recommendations: Dysphagia 1 (puree);Thin liquid Liquids provided via: Cup;Straw Medication Administration: Via alternative means (or crushed with puree) Supervision: Staff to assist with self feeding;Full supervision/cueing for compensatory strategies Compensations: Minimize environmental distractions;Slow rate;Small sips/bites Postural Changes and/or Swallow Maneuvers: Seated upright 90 degrees                Oral Care Recommendations: Staff/trained caregiver to provide oral care;Oral care BID Follow up Recommendations: Other (comment) (TBD) SLP Visit Diagnosis: Dysphagia, unspecified (R13.10) Plan: Continue with current plan of care       Jose [name] I. Hardin Negus, [place], [place] Office number [phone] Pager [place]  08/19/2021, 9:26 AM

## 2021-08-19 NOTE — Progress Notes (Addendum)
Occupational Therapy Treatment Patient Details Name: Jose [name] MRN: [id number] DOB: 1976/02/04 Today's Date: 08/19/2021   History of present illness 45 y.o. male admitted on 08/06/2021 with worsening back pain, S/P T4-5 laminectomy, LT5 pediculotomy, and decompression of spinal cord on 10/14.  Pt with acute hypoxic respiratory failure in setting of recent GI bleed and recent spinal surgery with extensive pain that is impeding his ability to take deep breaths. Endoscopy 10/17. Intubated 10/19-10/24. PMhx: T2DM, retinopathy, neuropathy, HTN, HLD, CKD, covid July 2022   OT comments  Patient supine in bed, seen for OT/PT session.  Patient completing bed mobility with total assist +2, sitting EOB with max assist +1 from PT as pt engaged with OT in suction oral care with max hand over hand assist and attempting self feeding of ice chips but overall requires total assist (utilized suction after ice chips due to drooling with RN notified). He is limited by L UE edema as well as generalized weakness and decreased coordination, keeping hands in flexed position.  He demonstrates difficulty attending, following commands without multimodal cueing and increased time, and problem solving. Disoriented to time, very difficult to understand his speech.  Returned to supine and repositioned with total assist.  VSS during session on 6L [place]. Will follow acutely, continue to recommend CIR.    Recommendations for follow up therapy are one component of a multi-disciplinary discharge planning process, led by the attending physician.  Recommendations may be updated based on patient status, additional functional criteria and insurance authorization.    Follow Up Recommendations  Acute inpatient rehab (3hours/day)    Assistance Recommended at Discharge Frequent or constant Supervision/Assistance  Equipment Recommendations  Other (comment) (defer to next venue)    Recommendations for Other Services Rehab consult     Precautions / Restrictions Precautions Precautions: Fall;Back Precaution Comments: cortrak Restrictions Weight Bearing Restrictions: No       Mobility Bed Mobility Overal bed mobility: Needs Assistance Bed Mobility: Rolling;Sidelying to Sit;Sit to Sidelying Rolling: +2 for physical assistance;+2 for safety/equipment;Total assist Sidelying to sit: +2 for physical assistance;Total assist;+2 for safety/equipment     Sit to sidelying: Total assist;+2 for physical assistance;+2 for safety/equipment General bed mobility comments: total assist to roll and transition to EOB, poor initation and sequencing    Transfers                   General transfer comment: deferred     Balance Overall balance assessment: Needs assistance Sitting-balance support: Feet supported;No upper extremity supported;Bilateral upper extremity supported Sitting balance-Leahy Scale: Zero Sitting balance - Comments: max assist to maintain balance at EOB                                   ADL either performed or assessed with clinical judgement   ADL Overall ADL's : Needs assistance/impaired Eating/Feeding: Total assistance;Sitting Eating/Feeding Details (indicate cue type and reason): assisted with feeding ice chips, hand over hand for spoon without success; used as ice chips as finger food but overall requires total assist to coordinate task Grooming: Maximal assistance;Sitting;Oral care Grooming Details (indicate cue type and reason): suction oral care at EOB with hand over hand assist to complete, coordinate task                             Functional mobility during ADLs: Total assistance;+2 for physical assistance;+2 for safety/equipment  General ADL Comments: pt remains limited by cognition, weakness, decreased activity tolerance     Vision       Perception     Praxis      Cognition Arousal/Alertness: Lethargic Behavior During Therapy: Flat affect Overall  Cognitive Status: Impaired/Different from baseline Area of Impairment: Attention;Following commands;Memory;Safety/judgement;Awareness;Problem solving;Orientation                 Orientation Level: Disoriented to;Time Current Attention Level: Focused Memory: Decreased recall of precautions;Decreased short-term memory Following Commands: Follows one step commands inconsistently;Follows one step commands with increased time Safety/Judgement: Decreased awareness of safety;Decreased awareness of deficits Awareness: Emergent Problem Solving: Slow processing;Decreased initiation;Requires tactile cues;Requires verbal cues;Difficulty sequencing General Comments: pt following simple commands with increased time but continues to require mulitmodal cueing, he has no recall of back precautions.  perseverates on ice chips once sitting EOB.  mumbled speech and very difficult to understand. REports it is [place].          Exercises     Shoulder Instructions       General Comments on 6L [place], VSS    Pertinent Vitals/ Pain       Pain Assessment: Faces Faces Pain Scale: No hurt  Home Living                                          Prior Functioning/Environment              Frequency  Min 2X/week        Progress Toward Goals  OT Goals(current goals can now be found in the care plan section)  Progress towards OT goals: Progressing toward goals  Acute Rehab OT Goals Time For Goal Achievement: 08/26/21 Potential to Achieve Goals: [place] Discharge plan remains appropriate;Frequency remains appropriate    Co-evaluation    PT/OT/SLP Co-Evaluation/Treatment: Yes Reason for Co-Treatment: Complexity of the patient's impairments (multi-system involvement);Necessary to address cognition/behavior during functional activity;For patient/therapist safety;To address functional/ADL transfers   OT goals addressed during session: ADL's and self-care      AM-PAC OT  "6 Clicks" Daily Activity     Outcome Measure   Help from another person eating meals?: Total Help from another person taking care of personal grooming?: A Lot Help from another person toileting, which includes using toliet, bedpan, or urinal?: Total Help from another person bathing (including washing, rinsing, drying)?: A Lot Help from another person to put on and taking off regular upper body clothing?: A Lot Help from another person to put on and taking off regular lower body clothing?: Total 6 Click Score: 9    End of Session Equipment Utilized During Treatment: Oxygen (6L)  OT Visit Diagnosis: Other abnormalities of gait and mobility (R26.89);Muscle weakness (generalized) (M62.81);Pain   Activity Tolerance Patient tolerated treatment well   Patient Left in bed;with call bell/phone within reach;with bed alarm set;with SCD's reapplied   Nurse Communication Mobility status        Time: 8850-2774 OT Time Calculation (min): 38 min  Charges: OT General Charges $OT Visit: 1 Visit OT Treatments $Self Care/Home Management : 8-22 mins $Therapeutic Activity: 8-22 mins  Jolaine Artist, OT Acute Rehabilitation Services Pager [phone] Office [phone]   Delight Stare 08/19/2021, 1:43 PM

## 2021-08-19 NOTE — Progress Notes (Signed)
Subjective:   1825 of UOP   crt down but BUN up -  also of note hgb dropped 2 grams -  may be reason why bun inc and K is up as well.   He is more alert but confused-  wife is having a tough time Objective Vital signs in last 24 hours: Vitals:   08/19/21 0815 08/19/21 0830 08/19/21 0845 08/19/21 0900  BP: (!) 147/60 (!) 145/61 (!) 144/70 (!) 150/69  Pulse: (!) 103 (!) 102 (!) 102 (!) 101  Resp: (!) 27 (!) 27 (!) 22 (!) 24  Temp:      TempSrc:      SpO2: 91% 93% 95% 95%  Weight:      Height:       Weight change: -8.1 kg  Intake/Output Summary (Last 24 hours) at 08/19/2021 0910 Last data filed at 08/19/2021 0700 Gross per 24 hour  Intake 1747.62 ml  Output 1675 ml  Net 72.62 ml    Assessment/ Plan: Pt is a 45 y.o. yo male CKD ( crt 1.5-2) , DM, HTN who was admitted on 08/06/2021 with MSSA bacteremia/osteomyelitis- s/p T4/5 laminectomy, pediculetomy and spinal cord decompression-  complicating A on CRF Assessment/Plan: 1. Renal-  baseline crt 1.5-2.  Now with A on CRF in the setting of above-  non oliguric and UOP increasing.  Yesterday was first day crt trended down !! No absolute need for HD , clinically improving so hope that we will continue to see improvement -  BUN and K are up today -  seemingly due to GIB -  still no indications for HD-  cont to watch 2. HTN/vol-  overloaded but UOP improving on own,   do not need to do diuretics necessarily 3. Anemia-  hgb low but stable-  added on ESA-  no iron due to osteo-  hgb dramatic drop last 24 hours-  getting blood today -  2 units-  now on protonix  4. Hyperkalemia-  supportive treatment for now- with increased UOP I felt like it would normalize on own-  now up in the setting of presumed GIB-  got dose of lokelma today -  for recheck at 2 PM-  can use lokelma or lasix to assist with this   [place]: Basic Metabolic Panel: Recent Labs  Lab 08/14/21 0415 08/14/21 0801 08/14/21 1700 08/15/21 0545  08/16/21 0454 08/17/21 0400 08/18/21 0334 08/19/21 0430  NA  --    < > 137 140   < > 141 142 146*  K  --    < > 4.8 4.6   < > 5.0 5.0 6.1*  CL  --    < > 105 107   < > 107 111 116*  CO2  --    < > 21* 22   < > 21* 21* 23  GLUCOSE  --    < > 365* 225*   < > 125* 244* 281*  BUN  --    < > 85* 97*   < > 113* 106* 130*  CREATININE  --    < > 3.78* 4.23*   < > 4.53* 3.73* 3.53*  CALCIUM  --    < > 8.2* 8.3*   < > 8.4* 8.9 8.8*  PHOS 4.2  --  3.7 3.2  --   --   --   --    < > = values in this interval not displayed.   Liver Function Tests: Recent  Labs  Lab 08/17/21 0400 08/18/21 0334 08/19/21 0430  AST 155* 74* 107*  ALT 114* 77* 46*  ALKPHOS 715* 636* 596*  BILITOT 2.8* 3.0* 2.4*  PROT 5.9* 6.3* 6.1*  ALBUMIN <1.5* 2.1* 2.3*   No results for input(s): LIPASE, AMYLASE in the last 168 hours. No results for input(s): AMMONIA in the last 168 hours. CBC: Recent Labs  Lab 08/13/21 0415 08/13/21 1041 08/14/21 0801 08/15/21 0545 08/16/21 0454 08/17/21 0400 08/18/21 0334 08/19/21 0430 08/19/21 0520  WBC 35.2*  --  26.3* 22.0* 22.0* 21.4* 21.7* 20.1*  --   NEUTROABS 30.2*  --  21.3*  --   --   --   --   --   --   HGB 7.1*   < > 8.1* 8.0* 8.6* 8.3* 8.1* 5.9* 5.8*  HCT 21.8*   < > 25.8* 25.4* 27.2* 26.5* 26.1* 19.1* 18.1*  MCV 89.7  --  90.5 89.4 91.0 90.8 92.6 92.7  --   PLT 412*  --  381 377 381 352 360 298  --    < > = values in this interval not displayed.   Cardiac Enzymes: Recent Labs  Lab 08/14/21 1700  CKTOTAL 25*   CBG: Recent Labs  Lab 08/18/21 1522 08/18/21 1929 08/18/21 2314 08/19/21 0332 08/19/21 0744  GLUCAP 184* 210* 207* 264* 202*    Iron Studies: No results for input(s): IRON, TIBC, TRANSFERRIN, FERRITIN in the last 72 hours. Studies/Results: DG CHEST PORT 1 VIEW  Result Date: 08/18/2021 CLINICAL DATA:  Dyspnea EXAM: PORTABLE CHEST 1 VIEW COMPARISON:  08/14/2021 FINDINGS: Low lung volumes. Persistent left lung opacities with greater  opacification at the lung base. New patchy density at the right lung base. Possible left pleural effusion. Normal heart size. Enteric tube is again identified. IMPRESSION: Low lung volumes with persistent left lung pneumonia. Worsening of atelectasis/consolidation at the lung bases. Possible small left pleural effusion. Electronically Signed   By: Macy Mis M.D.   On: 08/18/2021 15:51   Medications: Infusions:  sodium chloride 10 mL/hr at 08/19/21 0700    ceFAZolin (ANCEF) IV Stopped (08/18/21 2326)   feeding supplement (VITAL 1.5 CAL) 1,000 mL (08/18/21 1721)   promethazine (PHENERGAN) injection (IM or IVPB) 25 mg (08/10/21 0506)    Scheduled Medications:  sodium chloride   Intravenous Once   sodium chloride   Intravenous Once   amLODipine  10 mg Per Tube Daily   Chlorhexidine Gluconate Cloth  6 each Topical Daily   darbepoetin (ARANESP) injection - NON-DIALYSIS  150 mcg Subcutaneous Q Mon-1800   feeding supplement (PROSource TF)  45 mL Per Tube BID   fluconazole  200 mg Per Tube Daily   gabapentin  200 mg Per Tube Q12H   heparin injection (subcutaneous)  5,000 Units Subcutaneous Q8H   insulin aspart  0-20 Units Subcutaneous Q4H   insulin aspart  8 Units Subcutaneous Q4H   insulin glargine-yfgn  15 Units Subcutaneous BID   lactulose  20 g Per Tube Daily   lidocaine  2 patch Transdermal Q24H   living well with diabetes book   Does not apply Once   mouth rinse  15 mL Mouth Rinse BID   melatonin  3 mg Oral QHS   methocarbamol  1,000 mg Per Tube TID   metoprolol tartrate  25 mg Per Tube BID   multivitamin with minerals  1 tablet Per Tube Daily   mupirocin ointment  1 application Topical BID   oxyCODONE  10 mg Per Tube  Q6H   pantoprazole (PROTONIX) IV  40 mg Intravenous Q12H   sodium chloride flush  10-40 mL Intracatheter Q12H   sodium chloride flush  3 mL Intravenous Q12H   sucralfate  1 g Per Tube TID WC & HS   traZODone  100 mg Per Tube QHS   venlafaxine  37.5 mg Per Tube  Daily    have reviewed scheduled and prn medications.  Physical Exam: General:  eyes open-  restless-  verbalizing more-  answering appropriately to some questions Heart: RRR Lungs: poor effort, CBS Abdomen: soft, slightly tender Extremities: pitting edema     08/19/2021,9:10 AM  LOS: 13 days

## 2021-08-19 NOTE — Progress Notes (Addendum)
Cross-coverage note:   Notified of Hgb 5.9 this am, down from 8.1 yesterday. He has had 2 BMs with no sign of bleeding per RN.   Plan to repeat H&H and type & screen now.    ADDENDUM: chem panel back with increased BUN despite lower SCr and increased potassium suspicious for possible UGIB. Plan to give [facility], resume twice-daily IV PPI, follow-up pending H&H and transfuse if indicated.

## 2021-08-19 NOTE — Progress Notes (Signed)
PROGRESS NOTE    ATREUS HASZ  GUY:[id number] DOB: 1976-09-24 DOA: 08/06/2021 PCP: [name], NP   Brief Narrative:  RUBY DILONE is a 45 y.o. with a pertinent PMH of GAD, HTN, HLD, macular degeneration, T2DM, CKD (Stage 3), tobacco use disorder, hx of pleural effusion, recent admission for MSSA bacteremia/osteomyelitis who was discharged home with IV antibiotics on 08/03/2021, returned to the ED on 08/07/2021 with worsening back pain and admitted under TRH, now s/p T4-T5 laminectomy, left T5 pediculotomy, and spinal cord decompression. On 10/17 patient developed coffee-ground emesis, s/p EGD showing circumferential esophagitis with hemorrhage and non-bleeding duodenal ulcers.On 10/17, pccm was consulted for acute hypoxia.    Significant Events: 10/13 -admitted with worsening thoracic osteomyelitis, constipation/abd distention, neurosurgery consulted  10/14 - T4-T5 laminectomy, left T5 pediculotomy, and spinal cord decompression 10/17 - Hgb drop to 6.2, coffee ground emesis s/p EGD with esophagitis and peptic ulcer 10/17 - hypoxia and pccm consult  10/19 intubated 10/21 developed worsening kidney function, nephro consulted.  10/24 extubated  Assessment & Plan:   Principal Problem:   Osteomyelitis of thoracic spine ([place]) Active Problems:   Type 2 diabetes mellitus with diabetic polyneuropathy, without long-term current use of insulin (HCC)   Hyperlipidemia   GAD (generalized anxiety disorder)   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)   Essential hypertension   CKD (chronic kidney disease) stage 3, GFR 30-59 ml/min (HCC)   Pleural effusion, bilateral  Acute hypoxic respiratory failure/aspiration pneumonia, pneumonitis: Patient required intubation on 08/12/2021, extubated on 08/17/2021 patient is still requiring 6 L of oxygen.  Some concern of possible aspiration pneumonia.  Recently finished Unasyn.  Currently on cefazolin.  Chest x-ray 08/19/2019 shows persistent left lung  pneumonia and worsening of atelectasis and consolidation at lung bases.  Procalcitonin 3.296 days ago.  We will repeat procalcitonin, if elevated, may need some anaerobes coverage.  PCCM on board as well.  Continue to wean oxygen.  Acute blood loss anemia/upper GI bleed/candidal esophagitis and PUD s/p EGD: Patient had acute drop in hemoglobin from 8.1-5.9 in last 24 hours.  Reportedly he had 1-2 black stools as well.  No hematemesis reported.  2 units of PRBC transfusion has been ordered.  DVT prophylaxis Lovenox is on hold, continue SCD.  Repeat H&H after transfusion.  Prior to this, last transfusion was on 08/14/2021.  Protonix 40 mg IV twice daily resumed this morning.   ATN on top of acute on chronic kidney disease stage IIIa: baseline creatinine of 1.5-2.0.  Creatinine peaked at 4.5 on 08/17/2021, now improving.  Good diuresis.  Nephrology on board and management per them.  Avoid nephrotoxic agents.   Hyperkalemia: 6.1 today.  Defer to nephrology.   Diabetes mellitus type 2 with hyperglycemia: Blood sugar was elevated yesterday, this morning within normal range.  Currently on Semglee 10 units, NovoLog 8 units every 4 hours and SSI.  We will continue this. Core track and placed since 08/12/2021.   Shock liver: Continue conservative management LFTs are improving.   Sinus tachycardia: Had a transesophageal echo on 04/28/2021 that showed an EF of 60% no wall motion abnormality.  Before admission was on verapamil.  Will start on low-dose of Lopressor 25 mg twice daily.   Diabetic ulcer of left great toe ([place]): Noted.   Essential hypertension Blood pressure is trending up we will allow permissive hypertension for renal protection. Start on low-dose metoprolol.   Pleural effusion, bilateral Due to third spacing, he was giving albumin with good diuresis.  Sacral decubitus ulcer stage II present on admission: Wound care on board.  DVT prophylaxis: SCDs Start: 08/06/21 1536   Code Status:  Full Code  Family Communication:  None present at bedside.  Plan of care discussed with patient in length and he verbalized understanding and agreed with it.  Status is: Inpatient  Remains inpatient appropriate because: Still very sick  Estimated body mass index is 28.29 kg/m as calculated from the following:   Height as of this encounter: 5\' 11"  (1.803 m).   Weight as of this encounter: 92 kg.  Pressure Injury 08/10/21 Coccyx Medial Stage 2 -  Partial thickness loss of dermis presenting as a shallow open injury with a red, pink wound bed without slough. (Active)  08/10/21 1700  Location: Coccyx (gluteal crease)  Location Orientation: Medial  Staging: Stage 2 -  Partial thickness loss of dermis presenting as a shallow open injury with a red, pink wound bed without slough.  Wound Description (Comments):   Present on Admission: Yes     Pressure Injury 08/14/21 Buttocks Bilateral Deep Tissue Pressure Injury - Purple or maroon localized area of discolored intact skin or blood-filled blister due to damage of underlying soft tissue from pressure and/or shear. DTI (Active)  08/14/21 0800  Location: Buttocks  Location Orientation: Bilateral  Staging: Deep Tissue Pressure Injury - Purple or maroon localized area of discolored intact skin or blood-filled blister due to damage of underlying soft tissue from pressure and/or shear.  Wound Description (Comments): DTI  Present on Admission:     Nutritional Assessment: Body mass index is 28.29 kg/m.[place] Kitchen Seen by dietician.  I agree with the assessment and plan as outlined below: Nutrition Status: Nutrition Problem: Increased nutrient needs Etiology: post-op healing Signs/Symptoms: estimated needs Interventions: Tube feeding, Prostat  .  Skin Assessment: I have examined the patient's skin and I agree with the wound assessment as performed by the wound care RN as outlined below: Pressure Injury 08/10/21 Coccyx Medial Stage 2 -  Partial thickness  loss of dermis presenting as a shallow open injury with a red, pink wound bed without slough. (Active)  08/10/21 1700  Location: Coccyx (gluteal crease)  Location Orientation: Medial  Staging: Stage 2 -  Partial thickness loss of dermis presenting as a shallow open injury with a red, pink wound bed without slough.  Wound Description (Comments):   Present on Admission: Yes     Pressure Injury 08/14/21 Buttocks Bilateral Deep Tissue Pressure Injury - Purple or maroon localized area of discolored intact skin or blood-filled blister due to damage of underlying soft tissue from pressure and/or shear. DTI (Active)  08/14/21 0800  Location: Buttocks  Location Orientation: Bilateral  Staging: Deep Tissue Pressure Injury - Purple or maroon localized area of discolored intact skin or blood-filled blister due to damage of underlying soft tissue from pressure and/or shear.  Wound Description (Comments): DTI  Present on Admission:     Consultants:  PCCM and ID  Procedures:  As above  Antimicrobials:  Anti-infectives (From admission, onward)    Start     Dose/Rate Route Frequency Ordered Stop   08/19/21 1200  Ampicillin-Sulbactam (UNASYN) 3 g in sodium chloride 0.9 % 100 mL IVPB        3 g 200 mL/hr over 30 Minutes Intravenous Every 8 hours 08/19/21 1055     08/19/21 1015  fluconazole (DIFLUCAN) IVPB 200 mg        200 mg 100 mL/hr over 60 Minutes Intravenous Every 24 hours 08/19/21 0916  08/17/21 2300  ceFAZolin (ANCEF) IVPB 2g/100 mL premix  Status:  Discontinued        2 g 200 mL/hr over 30 Minutes Intravenous Every 12 hours 08/17/21 1054 08/19/21 1055   08/16/21 1000  fluconazole (DIFLUCAN) tablet 200 mg  Status:  Discontinued        200 mg Per Tube Daily 08/15/21 1414 08/19/21 0916   08/15/21 2314  Ampicillin-Sulbactam (UNASYN) 3 g in sodium chloride 0.9 % 100 mL IVPB  Status:  Discontinued        3 g 200 mL/hr over 30 Minutes Intravenous Every 12 hours 08/15/21 1410 08/17/21 1054    08/13/21 1000  fluconazole (DIFLUCAN) tablet 400 mg  Status:  Discontinued        400 mg Per Tube Daily 08/12/21 1148 08/15/21 1414   08/12/21 1000  fluconazole (DIFLUCAN) tablet 400 mg  Status:  Discontinued        400 mg Oral Daily 08/12/21 0913 08/12/21 1148   08/11/21 1800  vancomycin (VANCOREADY) IVPB 1250 mg/250 mL  Status:  Discontinued        1,250 mg 166.7 mL/hr over 90 Minutes Intravenous Every 24 hours 08/10/21 1651 08/11/21 0939   08/11/21 1030  ceFEPIme (MAXIPIME) 2 g in sodium chloride 0.9 % 100 mL IVPB  Status:  Discontinued        2 g 200 mL/hr over 30 Minutes Intravenous Every 12 hours 08/11/21 0936 08/11/21 0939   08/11/21 1030  Ampicillin-Sulbactam (UNASYN) 3 g in sodium chloride 0.9 % 100 mL IVPB  Status:  Discontinued        3 g 200 mL/hr over 30 Minutes Intravenous Every 6 hours 08/11/21 0940 08/15/21 1410   08/10/21 1730  vancomycin (VANCOREADY) IVPB 1750 mg/350 mL        1,750 mg 175 mL/hr over 120 Minutes Intravenous  Once 08/10/21 1651 08/10/21 1956   08/10/21 1715  piperacillin-tazobactam (ZOSYN) IVPB 3.375 g  Status:  Discontinued        3.375 g 12.5 mL/hr over 240 Minutes Intravenous Every 8 hours 08/10/21 1651 08/11/21 0936   08/06/21 2200  ceFAZolin (ANCEF) IVPB 2g/100 mL premix  Status:  Discontinued        2 g 200 mL/hr over 30 Minutes Intravenous Every 8 hours 08/06/21 1829 08/10/21 1631   08/06/21 1415  vancomycin (VANCOREADY) IVPB 1750 mg/350 mL  Status:  Discontinued        1,750 mg 175 mL/hr over 120 Minutes Intravenous Every 24 hours 08/06/21 1400 08/06/21 1829   08/06/21 1415  ceFEPIme (MAXIPIME) 2 g in sodium chloride 0.9 % 100 mL IVPB  Status:  Discontinued        2 g 200 mL/hr over 30 Minutes Intravenous Every 8 hours 08/06/21 1400 08/06/21 1829          Subjective: Patient seen and examined.  Patient had slightly slurred speech, likely due to significant weakness.  He was oriented for most part but did seem to have some confusion as  well.  Objective: Vitals:   08/19/21 1030 08/19/21 1035 08/19/21 1139 08/19/21 1245  BP: (!) 167/69   (!) 155/83  Pulse: (!) 102 (!) 102  (!) 105  Resp: (!) 24 (!) 22  20  Temp:  99.5 F (37.5 C) 98.6 F (37 C)   TempSrc:   Oral   SpO2: 94% 95%  96%  Weight:      Height:        Intake/Output Summary (Last 24 hours)  at 08/19/2021 1342 Last data filed at 08/19/2021 1245 Gross per 24 hour  Intake 1970.21 ml  Output 1675 ml  Net 295.21 ml   Filed Weights   08/17/21 0500 08/18/21 0238 08/19/21 0400  Weight: 97.7 kg 100.1 kg 92 kg    Examination:  General exam: Appears calm and comfortable cachectic and sick looking. Respiratory system: Diminished breath sounds with scattered rhonchi. Respiratory effort normal. Cardiovascular system: S1 & S2 heard, RRR. No JVD, murmurs, rubs, gallops or clicks. No pedal edema. Gastrointestinal system: Abdomen is nondistended, soft and nontender. No organomegaly or masses felt. Normal bowel sounds heard. Central nervous system: Alert and oriented x3. No focal neurological deficits. Extremities: Symmetric 5 x 5 power. Skin: No rashes, lesions or ulcers Psychiatry: Judgement and insight appear poor   Data Reviewed: I have personally reviewed following labs and imaging studies  CBC: Recent Labs  Lab 08/13/21 0415 08/13/21 1041 08/14/21 0801 08/15/21 0545 08/16/21 0454 08/17/21 0400 08/18/21 0334 08/19/21 0430 08/19/21 0520  WBC 35.2*  --  26.3* 22.0* 22.0* 21.4* 21.7* 20.1*  --   NEUTROABS 30.2*  --  21.3*  --   --   --   --   --   --   HGB 7.1*   < > 8.1* 8.0* 8.6* 8.3* 8.1* 5.9* 5.8*  HCT 21.8*   < > 25.8* 25.4* 27.2* 26.5* 26.1* 19.1* 18.1*  MCV 89.7  --  90.5 89.4 91.0 90.8 92.6 92.7  --   PLT 412*  --  381 377 381 352 360 298  --    < > = values in this interval not displayed.   Basic Metabolic Panel: Recent Labs  Lab 08/13/21 0415 08/13/21 1041 08/13/21 1700 08/14/21 0415 08/14/21 0801 08/14/21 1700 08/15/21 0545  08/16/21 0454 08/17/21 0400 08/18/21 0334 08/19/21 0430  NA 140   < >  --   --    < > 137 140 138 141 142 146*  K 3.9   < >  --   --    < > 4.8 4.6 4.7 5.0 5.0 6.1*  CL 109  --   --   --    < > 105 107 107 107 111 116*  CO2 22  --   --   --    < > 21* 22 21* 21* 21* 23  GLUCOSE 208*  --   --   --    < > 365* 225* 139* 125* 244* 281*  BUN 55*  --   --   --    < > 85* 97* 107* 113* 106* 130*  CREATININE 1.89*  --   --   --    < > 3.78* 4.23* 4.43* 4.53* 3.73* 3.53*  CALCIUM 8.4*  --   --   --    < > 8.2* 8.3* 8.1* 8.4* 8.9 8.8*  MG 2.2  --  2.4 2.5*  --  2.6* 2.6*  --   --   --   --   PHOS 3.8  --  3.5 4.2  --  3.7 3.2  --   --   --   --    < > = values in this interval not displayed.   GFR: Estimated Creatinine Clearance: 30 mL/min (A) (by C-G formula based on SCr of 3.53 mg/dL (H)). Liver Function Tests: Recent Labs  Lab 08/15/21 0545 08/16/21 0454 08/17/21 0400 08/18/21 0334 08/19/21 0430  AST 580* 253* 155* 74* 107*  ALT 211* 157* 114* 77*  46*  ALKPHOS 804* 836* 715* 636* 596*  BILITOT 1.7* 2.3* 2.8* 3.0* 2.4*  PROT 5.7* 5.5* 5.9* 6.3* 6.1*  ALBUMIN <1.5* <1.5* <1.5* 2.1* 2.3*   No results for input(s): LIPASE, AMYLASE in the last 168 hours. No results for input(s): AMMONIA in the last 168 hours. Coagulation Profile: No results for input(s): INR, PROTIME in the last 168 hours. Cardiac Enzymes: Recent Labs  Lab 08/14/21 1700  CKTOTAL 25*   BNP (last 3 results) No results for input(s): PROBNP in the last 8760 hours. HbA1C: No results for input(s): HGBA1C in the last 72 hours. CBG: Recent Labs  Lab 08/18/21 1929 08/18/21 2314 08/19/21 0332 08/19/21 0744 08/19/21 1132  GLUCAP 210* 207* 264* 202* 85   Lipid Profile: Recent Labs    08/17/21 0400  TRIG 174*   Thyroid Function Tests: No results for input(s): TSH, T4TOTAL, FREET4, T3FREE, THYROIDAB in the last 72 hours. Anemia Panel: No results for input(s): VITAMINB12, FOLATE, FERRITIN, TIBC, IRON,  RETICCTPCT in the last 72 hours. Sepsis Labs: Recent Labs  Lab 08/13/21 0415  PROCALCITON 3.29    Recent Results (from the past 240 hour(s))  Culture, blood (routine x 2)     Status: None   Collection Time: 08/12/21  8:19 AM   Specimen: BLOOD  Result Value Ref Range Status   Specimen Description BLOOD LEFT ANTECUBITAL  Final   Special Requests   Final    BOTTLES DRAWN AEROBIC AND ANAEROBIC Blood Culture adequate volume   Culture   Final    NO GROWTH 5 DAYS Performed at [facility], [address] [place], [place] [address]    Report Status 08/17/2021 FINAL  Final  Culture, blood (routine x 2)     Status: None   Collection Time: 08/12/21  8:20 AM   Specimen: BLOOD LEFT HAND  Result Value Ref Range Status   Specimen Description BLOOD LEFT HAND  Final   Special Requests   Final    BOTTLES DRAWN AEROBIC ONLY Blood Culture results may not be optimal due to an inadequate volume of blood received in culture bottles   Culture   Final    NO GROWTH 5 DAYS Performed at [facility], [place] [address] [place], [place] [address]    Report Status 08/17/2021 FINAL  Final  Culture, Respiratory w Gram Stain     Status: None   Collection Time: 08/13/21  4:30 PM   Specimen: Tracheal Aspirate; Respiratory  Result Value Ref Range Status   Specimen Description TRACHEAL ASPIRATE  Final   Special Requests Normal  Final   Gram Stain   Final    NO SQUAMOUS EPITHELIAL CELLS SEEN FEW WBC SEEN NO ORGANISMS SEEN    Culture   Final    NO GROWTH 2 DAYS Performed at [facility], [address], [place], [place] [address]    Report Status 08/15/2021 FINAL  Final  Body fluid culture w Gram Stain     Status: None   Collection Time: 08/14/21  8:15 AM   Specimen: Pleural Fluid  Result Value Ref Range Status   Specimen Description FLUID  Final   Special Requests  PLEURAL FLUID  Final   Gram Stain   Final    MODERATE WBC PRESENT, PREDOMINANTLY MONONUCLEAR NO ORGANISMS SEEN     Culture   Final    NO GROWTH 3 DAYS Performed at [facility], [place] [address], [place], [place] [address]    Report  Status 08/17/2021 FINAL  Final      Radiology Studies: DG CHEST PORT 1 VIEW  Result Date: 08/18/2021 CLINICAL DATA:  Dyspnea EXAM: PORTABLE CHEST 1 VIEW COMPARISON:  08/14/2021 FINDINGS: Low lung volumes. Persistent left lung opacities with greater opacification at the lung base. New patchy density at the right lung base. Possible left pleural effusion. Normal heart size. Enteric tube is again identified. IMPRESSION: Low lung volumes with persistent left lung pneumonia. Worsening of atelectasis/consolidation at the lung bases. Possible small left pleural effusion. Electronically Signed   By: Macy Mis M.D.   On: 08/18/2021 15:51    Scheduled Meds:  sodium chloride   Intravenous Once   Chlorhexidine Gluconate Cloth  6 each Topical Daily   darbepoetin (ARANESP) injection - NON-DIALYSIS  150 mcg Subcutaneous Q Mon-1800   feeding supplement (PROSource TF)  45 mL Per Tube BID   insulin aspart  0-20 Units Subcutaneous Q4H   insulin aspart  8 Units Subcutaneous Q4H   insulin glargine-yfgn  10 Units Subcutaneous BID   lidocaine  2 patch Transdermal Q24H   living well with diabetes book   Does not apply Once   LORazepam  0.5 mg Intravenous Q6H   mouth rinse  15 mL Mouth Rinse BID   melatonin  3 mg Oral QHS   metoprolol tartrate  5 mg Intravenous Q6H   mupirocin ointment  1 application Topical BID   pantoprazole (PROTONIX) IV  40 mg Intravenous Q12H   sodium chloride flush  10-40 mL Intracatheter Q12H   sodium chloride flush  3 mL Intravenous Q12H   sodium zirconium cyclosilicate  10 g Per Tube TID   Continuous Infusions:  sodium chloride 10 mL/hr at 08/19/21 0700   ampicillin-sulbactam (UNASYN) IV 3 g (08/19/21 1247)   desmopressin (DDAVP) IV for Bleeding     feeding supplement (VITAL 1.5 CAL) 1,000 mL (08/18/21 1721)   fluconazole (DIFLUCAN) IV      promethazine (PHENERGAN) injection (IM or IVPB) 25 mg (08/10/21 0506)     LOS: 13 days   Time spent: 40 minutes   Darliss Cheney, MD [facility]  08/19/2021, 1:42 PM  Please page via [place] and do not message via secure chat for anything urgent. Secure chat can be used for anything non urgent.  How to contact the [facility] Attending or Consulting provider [place] or covering provider during after hours [place], for this patient?  Check the care team in [facility] and look for a) attending/consulting [facility] provider listed and b) the [facility] team listed. Page or secure chat [phone]. Log into [email or web address] and use [place]'s universal password to access. If you do not have the password, please contact the hospital operator. Locate the [facility] provider you are looking for under [facility] and page to a number that you can be directly reached. If you still have difficulty reaching the provider, please page the [facility] (Director on Call) for the Hospitalists listed on amion for assistance.

## 2021-08-20 ENCOUNTER — Inpatient Hospital Stay (HOSPITAL_COMMUNITY): Payer: 59

## 2021-08-20 DIAGNOSIS — M4624 Osteomyelitis of vertebra, thoracic region: Secondary | ICD-10-CM | POA: Diagnosis not present

## 2021-08-20 LAB — HEMOGLOBIN AND HEMATOCRIT, BLOOD
HCT: 23.1 % — ABNORMAL LOW (ref 39.0–52.0)
HCT: 22.1 % — ABNORMAL LOW (ref 39.0–52.0)
Hemoglobin: 7.4 g/dL — ABNORMAL LOW (ref 13.0–17.0)
Hemoglobin: 7.1 g/dL — ABNORMAL LOW (ref 13.0–17.0)

## 2021-08-20 LAB — CBC WITH DIFFERENTIAL/PLATELET
Abs Immature Granulocytes: 0.75 10*3/uL — ABNORMAL HIGH (ref 0.00–0.07)
Basophils Absolute: 0.1 10*3/uL (ref 0.0–0.1)
Basophils Relative: 0 %
Eosinophils Absolute: 0.6 10*3/uL — ABNORMAL HIGH (ref 0.0–0.5)
Eosinophils Relative: 3 %
HCT: 23.4 % — ABNORMAL LOW (ref 39.0–52.0)
Hemoglobin: 7.7 g/dL — ABNORMAL LOW (ref 13.0–17.0)
Immature Granulocytes: 4 %
Lymphocytes Relative: 10 %
Lymphs Abs: 2 10*3/uL (ref 0.7–4.0)
MCH: 28.4 pg (ref 26.0–34.0)
MCHC: 32.9 g/dL (ref 30.0–36.0)
MCV: 86.3 fL (ref 80.0–100.0)
Monocytes Absolute: 1.6 10*3/uL — ABNORMAL HIGH (ref 0.1–1.0)
Monocytes Relative: 8 %
Neutro Abs: 16.1 10*3/uL — ABNORMAL HIGH (ref 1.7–7.7)
Neutrophils Relative %: 75 %
Platelets: 338 10*3/uL (ref 150–400)
RBC: 2.71 MIL/uL — ABNORMAL LOW (ref 4.22–5.81)
RDW: 18 % — ABNORMAL HIGH (ref 11.5–15.5)
WBC: 21.1 10*3/uL — ABNORMAL HIGH (ref 4.0–10.5)
nRBC: 0 % (ref 0.0–0.2)

## 2021-08-20 LAB — GLUCOSE, CAPILLARY
Glucose-Capillary: 125 mg/dL — ABNORMAL HIGH (ref 70–99)
Glucose-Capillary: 142 mg/dL — ABNORMAL HIGH (ref 70–99)
Glucose-Capillary: 162 mg/dL — ABNORMAL HIGH (ref 70–99)
Glucose-Capillary: 190 mg/dL — ABNORMAL HIGH (ref 70–99)
Glucose-Capillary: 194 mg/dL — ABNORMAL HIGH (ref 70–99)
Glucose-Capillary: 236 mg/dL — ABNORMAL HIGH (ref 70–99)

## 2021-08-20 LAB — RENAL FUNCTION PANEL
Albumin: 2.2 g/dL — ABNORMAL LOW (ref 3.5–5.0)
Anion gap: 8 (ref 5–15)
BUN: 114 mg/dL — ABNORMAL HIGH (ref 6–20)
CO2: 22 mmol/L (ref 22–32)
Calcium: 9.1 mg/dL (ref 8.9–10.3)
Chloride: 118 mmol/L — ABNORMAL HIGH (ref 98–111)
Creatinine, Ser: 2.94 mg/dL — ABNORMAL HIGH (ref 0.61–1.24)
GFR, Estimated: 26 mL/min — ABNORMAL LOW (ref >60)
Glucose, Bld: 258 mg/dL — ABNORMAL HIGH (ref 70–99)
Phosphorus: 4.3 mg/dL (ref 2.5–4.6)
Potassium: 4.6 mmol/L (ref 3.5–5.1)
Sodium: 148 mmol/L — ABNORMAL HIGH (ref 135–145)

## 2021-08-20 LAB — BASIC METABOLIC PANEL
Anion gap: 12 (ref 5–15)
BUN: 119 mg/dL — ABNORMAL HIGH (ref 6–20)
CO2: 21 mmol/L — ABNORMAL LOW (ref 22–32)
Calcium: 9.1 mg/dL (ref 8.9–10.3)
Chloride: 116 mmol/L — ABNORMAL HIGH (ref 98–111)
Creatinine, Ser: 3 mg/dL — ABNORMAL HIGH (ref 0.61–1.24)
GFR, Estimated: 25 mL/min — ABNORMAL LOW (ref >60)
Glucose, Bld: 195 mg/dL — ABNORMAL HIGH (ref 70–99)
Potassium: 4.5 mmol/L (ref 3.5–5.1)
Sodium: 149 mmol/L — ABNORMAL HIGH (ref 135–145)

## 2021-08-20 IMAGING — NM NM PULMONARY PERF PARTICULATE
8 series · 8 of 8 positions shown · non-contrast
Comparison: None.

CLINICAL DATA: Recent [J6] infection.  Short of breath

EXAM:
NUCLEAR MEDICINE PERFUSION LUNG SCAN
TECHNIQUE: Perfusion images were obtained in multiple projections after
intravenous injection of radiopharmaceutical.
RADIOPHARMACEUTICALS:  Chest radiograph [DATE] mCi [J6] MAA

[Series 1: ant/post perf · 4.14mm/px · 1 of 1 slices shown (1 of 2)]
[im 1/1]
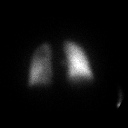

[Series 1: ant/post perf · 4.14mm/px · 1 of 1 slices shown (2 of 2)]
[im 1/1]
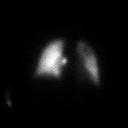

[Series 2: lao/rpo perf · 4.14mm/px · 1 of 1 slices shown (1 of 2)]
[im 1/1]
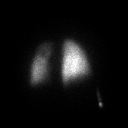

[Series 2: lao/rpo perf · 4.14mm/px · 1 of 1 slices shown (2 of 2)]
[im 1/1]
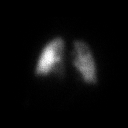

[Series 3: lpo/rao perf · 4.14mm/px · 1 of 1 slices shown (1 of 2)]
[im 1/1]
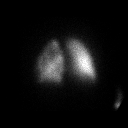

[Series 3: lpo/rao perf · 4.14mm/px · 1 of 1 slices shown (2 of 2)]
[im 1/1]
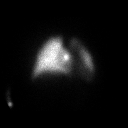

[Series 4: lt lat/rt lat perf · 4.14mm/px · 1 of 1 slices shown (1 of 2)]
[im 1/1]
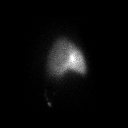

[Series 4: lt lat/rt lat perf · 4.14mm/px · 1 of 1 slices shown (2 of 2)]
[im 1/1]
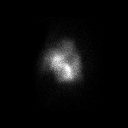

[8 of 8 positions shown; findings below may reference images not displayed]

FINDINGS: Decreased regional perfusion to the LEFT upper lobe which has a more
central photopenia than a peripheral photopenia. Pattern not typical
of pulmonary embolism. No perfusion defects in the RIGHT lung.

Comparison chest radiograph demonstrates diffuse bilateral airspace
disease.
IMPRESSION: Large perfusion abnormality in the LEFT upper lobe which has a more
regional distribution than segmental. Pattern not typical of
pulmonary embolism however cannot exclude pulmonary embolism.
Differential would include pulmonary embolism versus pneumonia.
Intermediate probability.

## 2021-08-20 IMAGING — DX DG CHEST 1V PORT
1 series · 1 of 1 positions shown · non-contrast
Comparison: Chest x-ray dated [DATE].

CLINICAL DATA: Nausea and vomiting.

EXAM:
PORTABLE CHEST 1 VIEW

[chest ap]
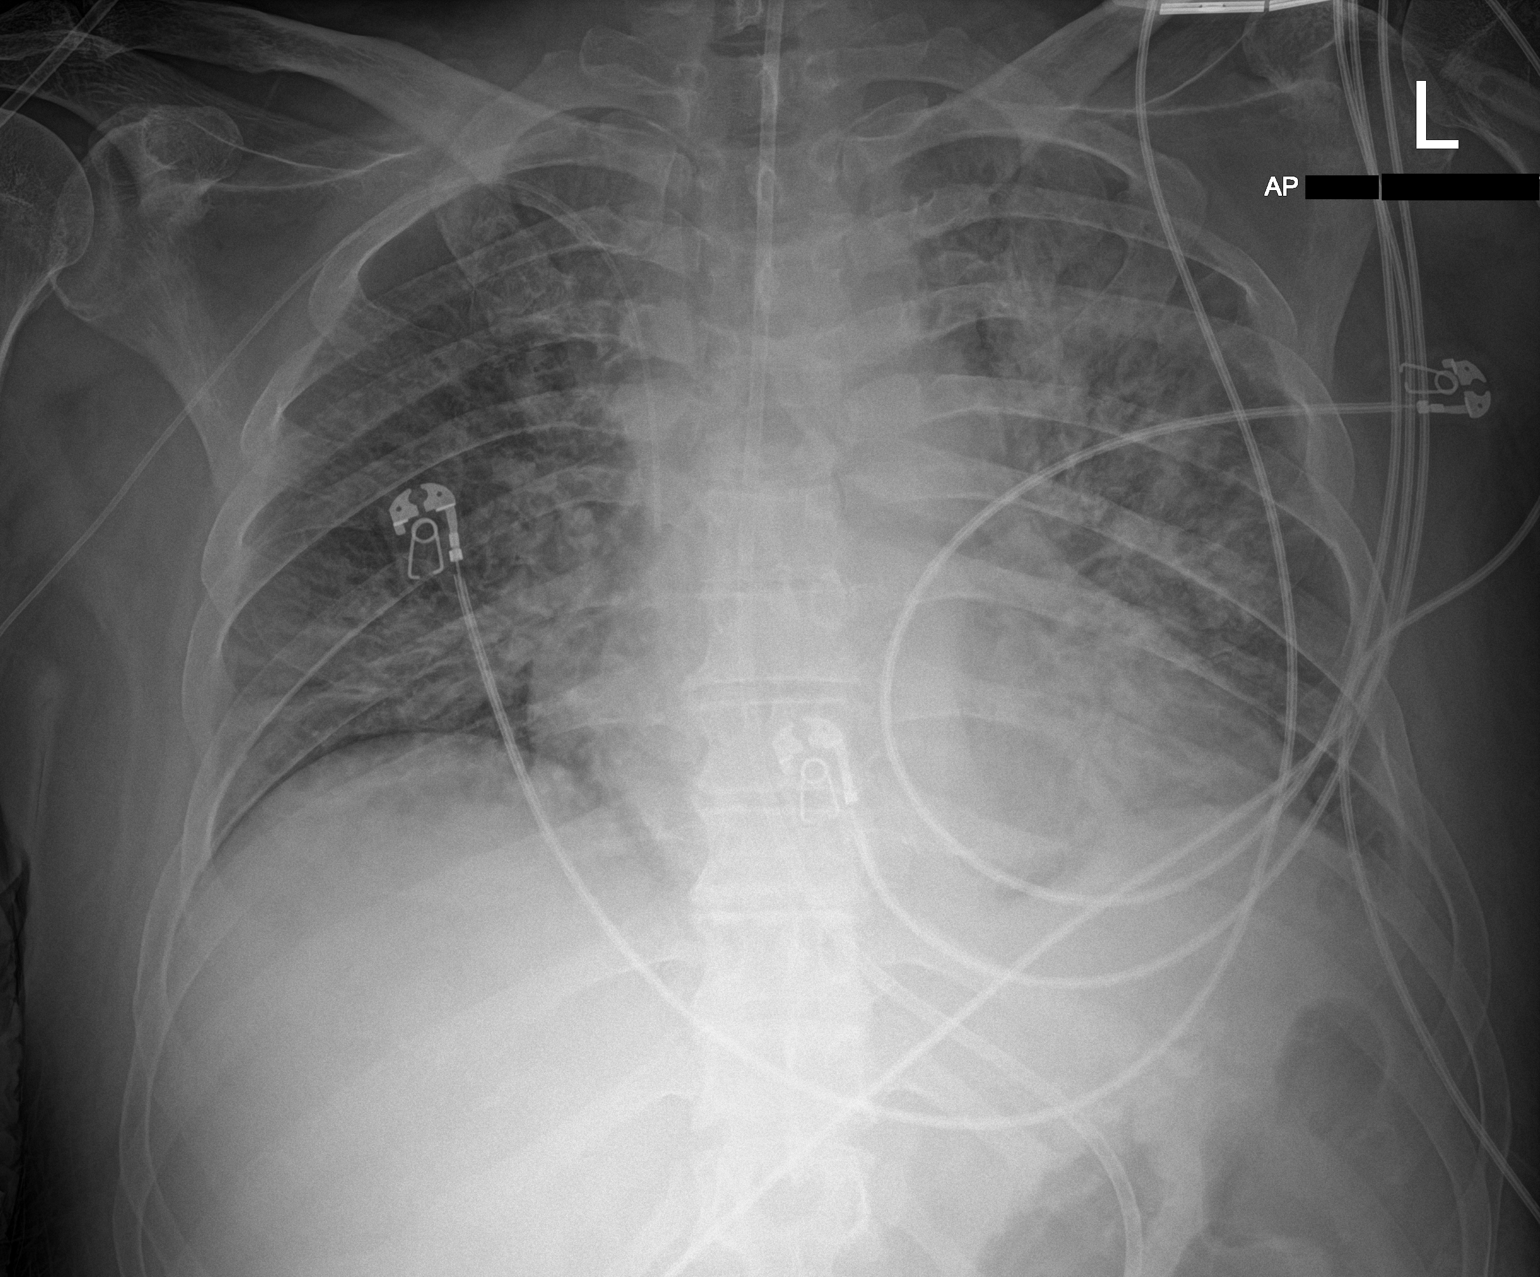

[1 of 1 positions shown; findings below may reference images not displayed]

FINDINGS: Unchanged feeding tube and right upper extremity PICC line. Stable
cardiomediastinal silhouette. Similar perihilar opacities throughout
the left lung. Unchanged mild right basilar opacity. No pleural
effusion or pneumothorax. No acute osseous abnormality.
IMPRESSION: 1. Similar left lung pneumonia.
2. Unchanged mild right basilar atelectasis versus infiltrate.

## 2021-08-20 MED ORDER — FLUCONAZOLE 200 MG PO TABS
200.0000 mg | ORAL_TABLET | Freq: Once | ORAL | Status: AC
  Administered 2021-08-20: 200 mg
  Filled 2021-08-20: qty 1

## 2021-08-20 MED ORDER — VENLAFAXINE HCL 37.5 MG PO TABS
37.5000 mg | ORAL_TABLET | Freq: Every day | ORAL | Status: DC
  Administered 2021-08-20 – 2021-08-24 (×5): 37.5 mg
  Filled 2021-08-20 (×5): qty 1

## 2021-08-20 MED ORDER — ENSURE ENLIVE PO LIQD
237.0000 mL | Freq: Three times a day (TID) | ORAL | Status: DC
  Administered 2021-08-20 – 2021-08-26 (×11): 237 mL via ORAL

## 2021-08-20 MED ORDER — FLUCONAZOLE 200 MG PO TABS
400.0000 mg | ORAL_TABLET | Freq: Every day | ORAL | Status: DC
  Filled 2021-08-20: qty 2

## 2021-08-20 MED ORDER — FLUCONAZOLE 200 MG PO TABS
200.0000 mg | ORAL_TABLET | Freq: Every day | ORAL | Status: DC
  Administered 2021-08-20: 200 mg
  Filled 2021-08-20: qty 1

## 2021-08-20 MED ORDER — LACTULOSE 10 GM/15ML PO SOLN
20.0000 g | Freq: Every day | ORAL | Status: DC
  Administered 2021-08-20: 20 g
  Filled 2021-08-20: qty 30

## 2021-08-20 MED ORDER — ALPRAZOLAM 0.25 MG PO TABS
0.2500 mg | ORAL_TABLET | Freq: Once | ORAL | Status: AC
  Administered 2021-08-20: 0.25 mg via ORAL
  Filled 2021-08-20: qty 1

## 2021-08-20 MED ORDER — INSULIN GLARGINE-YFGN 100 UNIT/ML ~~LOC~~ SOLN
15.0000 [IU] | Freq: Two times a day (BID) | SUBCUTANEOUS | Status: DC
  Administered 2021-08-20 (×2): 15 [IU] via SUBCUTANEOUS
  Filled 2021-08-20 (×4): qty 0.15

## 2021-08-20 MED ORDER — AMLODIPINE BESYLATE 5 MG PO TABS
5.0000 mg | ORAL_TABLET | Freq: Every day | ORAL | Status: DC
  Administered 2021-08-20 – 2021-08-24 (×3): 5 mg via NASOGASTRIC
  Filled 2021-08-20 (×4): qty 1

## 2021-08-20 MED ORDER — FREE WATER
200.0000 mL | Freq: Four times a day (QID) | Status: DC
  Administered 2021-08-20 – 2021-08-21 (×4): 200 mL

## 2021-08-20 MED ORDER — ALPRAZOLAM 0.5 MG PO TABS: 1.0000 mg | ORAL_TABLET | Freq: Once | ORAL | Status: DC

## 2021-08-20 MED ORDER — GABAPENTIN 250 MG/5ML PO SOLN
200.0000 mg | Freq: Two times a day (BID) | ORAL | Status: DC
  Administered 2021-08-20 – 2021-08-24 (×9): 200 mg
  Filled 2021-08-20 (×9): qty 4

## 2021-08-20 MED ORDER — TECHNETIUM TO 99M ALBUMIN AGGREGATED
4.4000 | Freq: Once | INTRAVENOUS | Status: AC | PRN
  Administered 2021-08-20: 4.4 via INTRAVENOUS

## 2021-08-20 MED ORDER — TRAZODONE HCL 50 MG PO TABS
100.0000 mg | ORAL_TABLET | Freq: Every day | ORAL | Status: DC
  Administered 2021-08-20 – 2021-08-23 (×4): 100 mg
  Filled 2021-08-20 (×4): qty 2

## 2021-08-20 MED ORDER — SUCRALFATE 1 GM/10ML PO SUSP
1.0000 g | Freq: Three times a day (TID) | ORAL | Status: DC
  Administered 2021-08-20 – 2021-08-23 (×11): 1 g
  Filled 2021-08-20 (×18): qty 10

## 2021-08-20 NOTE — Progress Notes (Signed)
Nutrition Follow-up  DOCUMENTATION CODES:   Not applicable  INTERVENTION:   Continue tube feeds via Cortrak: - Vital 1.5 @ 60 ml/hr (1440 ml/day) - ProSource TF 45 ml BID  Tube feeding regimen provides 2240 kcal, 119 grams of protein, and 1100 ml of H2O.   - Ensure Enlive po TID, each supplement provides 350 kcal and 20 grams of protein   RD will monitor for improvements in PO intake and transition pt to nocturnal tube feeding regimen as appropriate: - Vital 1.5 @ 85 ml/hr x 12 hours from 2000 to 0800 (total of 1020 ml) - ProSource TF 45 ml TID  Nocturnal tube feeding regimen would provide 1650 kcal, 102 grams of protein, and 779 ml of H2O (meets 77% of kcal needs and 93% of protein needs).  NUTRITION DIAGNOSIS:   Increased nutrient needs related to post-op healing as evidenced by estimated needs.  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 Pt was discharged home with abx but reports significant pain and inability to walk or sit-up since being discharged.  10/14 - s/p T4-T5 laminectomy, left T5 pediculotomy for decompression of spinal cord, intraoperative ultrasound, intraoperative microscope 10/16 - dark brown/red emesis 10/17 - s/p EGD showing esophagitis, hypoxia and PCCM consult, transferred to ICU 10/19 - intubated, Cortrak placed (tip gastric) 10/21 - s/p thoracentesis 10/24 - extubated 10/26 - diet advanced to dysphagia 1 with thin liquids  Discussed pt with RN and  during ICU rounds. Pt with acute drop in hemoglobin that improved with transfusion. Per notes, likely 2/2 esophagitis and duodenal ulcer. Pt remains encephalopathy 2/2 uremia. Nephrology following with no plans for HD at this time as BUN and creatinine are trending down. Nephrology has ordered free water via tube due to hypernatremia.  Per RN, pt barely eating but did drink some Boost Breeze this morning. Will continue with continuous tube feeds until PO intake improves. RD to order Ensure Enlive supplements to aid pt in meeting kcal and protein needs via PO route.  Current TF: Vital 1.5 @ 60 ml/hr, ProSource TF 45 ml BID, free water flushes of 200 ml q 6 hours  Admit weight: 86.2 kg Current weight: 91.9 kg  Pt continues to have mild to moderate pitting edema. Suspect dry weight is closer to admit weight of 86.2 kg.  Medications reviewed and include: aranesp weekly, diflucan, SSI q 4 hours, novolog 8 units q 4 hours, semglee 15 units BID, lactulose, melatonin, IV protonix, sucralfate, IV abx  Labs reviewed: sodium 148, BUN 114, creatinine 2.94, hemoglobin 7.7 CBG's: 162-254 x 24 hours  UOP: 3075 ml x 24 hours I/O's: +6.3 L since admit  Diet Order:   Diet Order             DIET - DYS 1 Room service appropriate? Yes with Assist; Fluid consistency: Thin  Diet effective now                   EDUCATION NEEDS:   No education needs have been identified  at this time  Skin:  Skin Assessment: Skin Integrity Issues: DTI: bilateral buttocks Stage II: coccyx Diabetic Ulcer: L big toe Incisions: back  Last BM:  08/19/21 large type 6  Height:   Ht Readings from Last 1 Encounters:  08/06/21 5' 11"  (1.803 m)    Weight:   Wt Readings from Last 1 Encounters:  08/20/21 91.9 kg    BMI:  Body mass index is 28.26 kg/m.  Estimated Nutritional Needs:   Kcal:  2150-2350  Protein:  110-130 grams  Fluid:  >2L/d    Gustavus Bryant, MS, RD, LDN Inpatient Clinical  Dietitian Please see AMi[facility] for contact information.

## 2021-08-20 NOTE — Progress Notes (Signed)
PROGRESS NOTE    [name]  TKW:[id number] DOB: 01-31-1976 DOA: 08/06/2021 PCP: [name], NP   Brief Narrative:  Jose [name] is a 45 y.o. with a pertinent PMH of GAD, HTN, HLD, macular degeneration, T2DM, CKD (Stage 3), tobacco use disorder, hx of pleural effusion, recent admission for MSSA bacteremia/osteomyelitis who was discharged home with IV antibiotics on 08/03/2021, returned to the ED on 08/07/2021 with worsening back pain and admitted under [facility], now s/p T4-T5 laminectomy, left T5 pediculotomy, and spinal cord decompression. On 10/17 patient developed coffee-ground emesis, s/p EGD showing circumferential esophagitis with hemorrhage and non-bleeding duodenal ulcers.On 10/17, pccm was consulted for acute hypoxia.    Significant Events: 10/13 -admitted with worsening thoracic osteomyelitis, constipation/abd distention, neurosurgery consulted  10/14 - T4-T5 laminectomy, left T5 pediculotomy, and spinal cord decompression 10/17 - Hgb drop to 6.2, coffee ground emesis s/p EGD with esophagitis and peptic ulcer 10/17 - hypoxia and pccm consult  10/19 intubated 10/21 developed worsening kidney function, nephro consulted.  10/24 extubated Transferred under [facility] on 08/18/2021.  Assessment & Plan:   Principal Problem:   Osteomyelitis of thoracic spine (HCC) Active Problems:   Type 2 diabetes mellitus with diabetic polyneuropathy, without long-term current use of insulin (HCC)   Hyperlipidemia   GAD (generalized anxiety disorder)   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)   Essential hypertension   CKD (chronic kidney disease) stage 3, GFR 30-59 ml/min (HCC)   Pleural effusion, bilateral  Acute hypoxic respiratory failure/aspiration pneumonia, pneumonitis: Patient required intubation on 08/12/2021, extubated on 08/17/2021, oxygenation improved, currently on 4 L instead of 6 L yesterday.  He recently completed course of Unasyn.  Chest x-ray 08/19/2019 shows persistent left  lung pneumonia and worsening of atelectasis and consolidation at lung bases.  Procalcitonin improved though.  Some concern of aspiration pneumonia, antibiotics switched from cefazolin to Unasyn again on 08/19/2021.    Acute blood loss anemia/upper GI bleed/candidal esophagitis and PUD s/p EGD: Patient had acute drop in hemoglobin from 8.1-5.9 within 24 hours on the morning of 08/19/2021, received 2 units of PRBC transfusion.  Hemoglobin improved to 7.7.  There were some reports of possibly having black stools before his hemoglobin dropped.  No further reports of that as such.  Continue Protonix 40 mg IV twice daily.  Patient had an episode of possible hemoptysis or hematemesis.  Will monitor H&H every 8 hours.  Patient has not been on any chemical prophylaxis for last 4 to 5 days due to acute blood loss anemia/upper GI bleed, this puts him at high risk of PE, due to high creatinine, cannot do CT angiogram, will proceed with VQ scan stat.  If negative and further drop in hemoglobin or further episodes of hemoptysis/hematemesis, will consult GI for consideration of possible repeat EGD.   ATN on top of acute on chronic kidney disease stage IIIa: baseline creatinine of 1.5-2.0.  Creatinine peaked at 4.5 on 08/17/2021, now improving, down to 3.0 today.  Good diuresis.  Nephrology on board and management per them.  Avoid nephrotoxic agents.   Hyperkalemia: Resolved after he received a dose of Lokelma yesterday.   Diabetes mellitus type 2 with hyperglycemia: Blood sugar improving..  Currently on Semglee 10 units, NovoLog 8 units every 4 hours and SSI.  We will continue this. Core track and placed since 08/12/2021.   Shock liver: Continue conservative management LFTs are improving.   Sinus tachycardia: Had a transesophageal echo on 04/28/2021 that showed an EF of 60% no wall  motion abnormality.  Before admission was on verapamil.  Continue Lopressor 25 mg p.o. twice daily.   Diabetic ulcer of left great toe  ([place]): Noted.   Essential hypertension Blood pressure is trending up we will allow permissive hypertension for renal protection.  Continue Lopressor as mentioned above.  We will also add low-dose amlodipine 5 mg daily.   Pleural effusion, bilateral Due to third spacing, he was giving albumin with good diuresis.   Sacral decubitus ulcer stage II present on admission: Wound care on board.  DVT prophylaxis: SCDs Start: 08/06/21 1536   Code Status: Full Code  Family Communication: Wife present at bedside.  Plan of care discussed in length with her..  Status is: Inpatient  Remains inpatient appropriate because: Still very sick  Estimated body mass index is 28.26 kg/m as calculated from the following:   Height as of this encounter: 5\' 11"  (1.803 m).   Weight as of this encounter: 91.9 kg.  Pressure Injury 08/10/21 Coccyx Medial Stage 2 -  Partial thickness loss of dermis presenting as a shallow open injury with a red, pink wound bed without slough. (Active)  08/10/21 1700  Location: Coccyx (gluteal crease)  Location Orientation: Medial  Staging: Stage 2 -  Partial thickness loss of dermis presenting as a shallow open injury with a red, pink wound bed without slough.  Wound Description (Comments):   Present on Admission: Yes     Pressure Injury 08/14/21 Buttocks Bilateral Deep Tissue Pressure Injury - Purple or maroon localized area of discolored intact skin or blood-filled blister due to damage of underlying soft tissue from pressure and/or shear. DTI (Active)  08/14/21 0800  Location: Buttocks  Location Orientation: Bilateral  Staging: Deep Tissue Pressure Injury - Purple or maroon localized area of discolored intact skin or blood-filled blister due to damage of underlying soft tissue from pressure and/or shear.  Wound Description (Comments): DTI  Present on Admission:     Nutritional Assessment: Body mass index is 28.26 kg/m.Marland Kitchen Seen by dietician.  I agree with the assessment and  plan as outlined below: Nutrition Status: Nutrition Problem: Increased nutrient needs Etiology: post-op healing Signs/Symptoms: estimated needs Interventions: Tube feeding, Prostat  .  Skin Assessment: I have examined the patient's skin and I agree with the wound assessment as performed by the wound care RN as outlined below: Pressure Injury 08/10/21 Coccyx Medial Stage 2 -  Partial thickness loss of dermis presenting as a shallow open injury with a red, pink wound bed without slough. (Active)  08/10/21 1700  Location: Coccyx (gluteal crease)  Location Orientation: Medial  Staging: Stage 2 -  Partial thickness loss of dermis presenting as a shallow open injury with a red, pink wound bed without slough.  Wound Description (Comments):   Present on Admission: Yes     Pressure Injury 08/14/21 Buttocks Bilateral Deep Tissue Pressure Injury - Purple or maroon localized area of discolored intact skin or blood-filled blister due to damage of underlying soft tissue from pressure and/or shear. DTI (Active)  08/14/21 0800  Location: Buttocks  Location Orientation: Bilateral  Staging: Deep Tissue Pressure Injury - Purple or maroon localized area of discolored intact skin or blood-filled blister due to damage of underlying soft tissue from pressure and/or shear.  Wound Description (Comments): DTI  Present on Admission:     Consultants:  PCCM and ID  Procedures:  As above  Antimicrobials:  Anti-infectives (From admission, onward)    Start     Dose/Rate Route Frequency Ordered Stop  08/21/21 1000  fluconazole (DIFLUCAN) tablet 400 mg        400 mg Per Tube Daily 08/20/21 1104 08/31/21 0959   08/20/21 1200  fluconazole (DIFLUCAN) tablet 200 mg        200 mg Per Tube  Once 08/20/21 1104 08/20/21 1207   08/20/21 1000  fluconazole (DIFLUCAN) tablet 200 mg  Status:  Discontinued        200 mg Per Tube Daily 08/20/21 0825 08/20/21 1104   08/20/21 0047  Ampicillin-Sulbactam (UNASYN) 3 g in  sodium chloride 0.9 % 100 mL IVPB  Status:  Discontinued        3 g 200 mL/hr over 30 Minutes Intravenous Every 12 hours 08/19/21 1619 08/19/21 1621   08/19/21 2100  Ampicillin-Sulbactam (UNASYN) 3 g in sodium chloride 0.9 % 100 mL IVPB        3 g 200 mL/hr over 30 Minutes Intravenous Every 8 hours 08/19/21 1621     08/19/21 1200  Ampicillin-Sulbactam (UNASYN) 3 g in sodium chloride 0.9 % 100 mL IVPB  Status:  Discontinued        3 g 200 mL/hr over 30 Minutes Intravenous Every 8 hours 08/19/21 1055 08/19/21 1619   08/19/21 1015  fluconazole (DIFLUCAN) IVPB 200 mg  Status:  Discontinued        200 mg 100 mL/hr over 60 Minutes Intravenous Every 24 hours 08/19/21 0916 08/20/21 0825   08/17/21 2300  ceFAZolin (ANCEF) IVPB 2g/100 mL premix  Status:  Discontinued        2 g 200 mL/hr over 30 Minutes Intravenous Every 12 hours 08/17/21 1054 08/19/21 1055   08/16/21 1000  fluconazole (DIFLUCAN) tablet 200 mg  Status:  Discontinued        200 mg Per Tube Daily 08/15/21 1414 08/19/21 0916   08/15/21 2314  Ampicillin-Sulbactam (UNASYN) 3 g in sodium chloride 0.9 % 100 mL IVPB  Status:  Discontinued        3 g 200 mL/hr over 30 Minutes Intravenous Every 12 hours 08/15/21 1410 08/17/21 1054   08/13/21 1000  fluconazole (DIFLUCAN) tablet 400 mg  Status:  Discontinued        400 mg Per Tube Daily 08/12/21 1148 08/15/21 1414   08/12/21 1000  fluconazole (DIFLUCAN) tablet 400 mg  Status:  Discontinued        400 mg Oral Daily 08/12/21 0913 08/12/21 1148   08/11/21 1800  vancomycin (VANCOREADY) IVPB 1250 mg/250 mL  Status:  Discontinued        1,250 mg 166.7 mL/hr over 90 Minutes Intravenous Every 24 hours 08/10/21 1651 08/11/21 0939   08/11/21 1030  ceFEPIme (MAXIPIME) 2 g in sodium chloride 0.9 % 100 mL IVPB  Status:  Discontinued        2 g 200 mL/hr over 30 Minutes Intravenous Every 12 hours 08/11/21 0936 08/11/21 0939   08/11/21 1030  Ampicillin-Sulbactam (UNASYN) 3 g in sodium chloride 0.9 % 100 mL  IVPB  Status:  Discontinued        3 g 200 mL/hr over 30 Minutes Intravenous Every 6 hours 08/11/21 0940 08/15/21 1410   08/10/21 1730  vancomycin (VANCOREADY) IVPB 1750 mg/350 mL        1,750 mg 175 mL/hr over 120 Minutes Intravenous  Once 08/10/21 1651 08/10/21 1956   08/10/21 1715  piperacillin-tazobactam (ZOSYN) IVPB 3.375 g  Status:  Discontinued        3.375 g 12.5 mL/hr over 240 Minutes Intravenous Every 8 hours  08/10/21 1651 08/11/21 0936   08/06/21 2200  ceFAZolin (ANCEF) IVPB 2g/100 mL premix  Status:  Discontinued        2 g 200 mL/hr over 30 Minutes Intravenous Every 8 hours 08/06/21 1829 08/10/21 1631   08/06/21 1415  vancomycin (VANCOREADY) IVPB 1750 mg/350 mL  Status:  Discontinued        1,750 mg 175 mL/hr over 120 Minutes Intravenous Every 24 hours 08/06/21 1400 08/06/21 1829   08/06/21 1415  ceFEPIme (MAXIPIME) 2 g in sodium chloride 0.9 % 100 mL IVPB  Status:  Discontinued        2 g 200 mL/hr over 30 Minutes Intravenous Every 8 hours 08/06/21 1400 08/06/21 1829          Subjective: Patient seen and examined earlier.  At that time, he was much more awake and alert and sitting in the chair.  Wife was at the bedside.  Wife also agreed that he was looking much better.  Patient was able to communicate better than yesterday.  He had no complaints.  Later on around 12:30 PM, I was informed that patient had an episode of hemoptysis.  Wife was requesting for me to evaluate patient at bedside again.  I saw patient at bedside.  Wife was about to leave at that time, she was already overwhelmed and crying.  I was able to take a look at the hemoptysis.  It was mixed blood and sputum.  Unable to tell whether this was real hemoptysis or hematemesis.  We will monitor H&H every 8 hours.  Further plans as mentioned above.  Objective: Vitals:   08/20/21 0740 08/20/21 0935 08/20/21 1100 08/20/21 1140  BP:  (!) 169/81 (!) 156/78   Pulse:  (!) 113 96   Resp:   (!) 29   Temp: 98.4 F  (36.9 C)   98.2 F (36.8 C)  TempSrc: Oral   Oral  SpO2:   95%   Weight:      Height:        Intake/Output Summary (Last 24 hours) at 08/20/2021 1313 Last data filed at 08/20/2021 0916 Gross per 24 hour  Intake 2268.03 ml  Output 3075 ml  Net -806.97 ml    Filed Weights   08/18/21 0238 08/19/21 0400 08/20/21 0442  Weight: 100.1 kg 92 kg 91.9 kg    Examination: General exam: Appears appeared calm and comfortable in the morning,.  Tachypneic in the afternoon Respiratory system: Questionable rhonchi in the left middle and upper lobe.  Slightly tachypneic. Cardiovascular system: S1 & S2 heard, sinus tachycardia. No JVD, murmurs, rubs, gallops or clicks. No pedal edema. Gastrointestinal system: Abdomen is nondistended, soft and nontender. No organomegaly or masses felt. Normal bowel sounds heard. Central nervous system: Alert and oriented. No focal neurological deficits.   Data Reviewed: I have personally reviewed following labs and imaging studies  CBC: Recent Labs  Lab 08/14/21 0801 08/15/21 0545 08/16/21 0454 08/17/21 0400 08/18/21 0334 08/19/21 0430 08/19/21 0520 08/19/21 1520 08/20/21 0327  WBC 26.3*   < > 22.0* 21.4* 21.7* 20.1*  --   --  21.1*  NEUTROABS 21.3*  --   --   --   --   --   --   --  16.1*  HGB 8.1*   < > 8.6* 8.3* 8.1* 5.9* 5.8* 7.9* 7.7*  HCT 25.8*   < > 27.2* 26.5* 26.1* 19.1* 18.1* 25.2* 23.4*  MCV 90.5   < > 91.0 90.8 92.6 92.7  --   --  86.3  PLT 381   < > 381 352 360 298  --   --  338   < > = values in this interval not displayed.    Basic Metabolic Panel: Recent Labs  Lab 08/13/21 1700 08/14/21 0415 08/14/21 0801 08/14/21 1700 08/15/21 0545 08/16/21 0454 08/18/21 0334 08/19/21 0430 08/19/21 1520 08/19/21 2155 08/20/21 0327  NA  --   --    < > 137 140   < > 142 146* 150* 149* 148*  K  --   --    < > 4.8 4.6   < > 5.0 6.1* 5.0 4.5 4.6  CL  --   --    < > 105 107   < > 111 116* 117* 116* 118*  CO2  --   --    < > 21* 22   < > 21*  23 21* 21* 22  GLUCOSE  --   --    < > 365* 225*   < > 244* 281* 139* 195* 258*  BUN  --   --    < > 85* 97*   < > 106* 130* 123* 119* 114*  CREATININE  --   --    < > 3.78* 4.23*   < > 3.73* 3.53* 3.27* 3.00* 2.94*  CALCIUM  --   --    < > 8.2* 8.3*   < > 8.9 8.8* 9.0 9.1 9.1  MG 2.4 2.5*  --  2.6* 2.6*  --   --   --   --   --   --   PHOS 3.5 4.2  --  3.7 3.2  --   --   --   --   --  4.3   < > = values in this interval not displayed.    GFR: Estimated Creatinine Clearance: 36 mL/min (A) (by C-G formula based on SCr of 2.94 mg/dL (H)). Liver Function Tests: Recent Labs  Lab 08/15/21 0545 08/16/21 0454 08/17/21 0400 08/18/21 0334 08/19/21 0430 08/20/21 0327  AST 580* 253* 155* 74* 107*  --   ALT 211* 157* 114* 77* 46*  --   ALKPHOS 804* 836* 715* 636* 596*  --   BILITOT 1.7* 2.3* 2.8* 3.0* 2.4*  --   PROT 5.7* 5.5* 5.9* 6.3* 6.1*  --   ALBUMIN <1.5* <1.5* <1.5* 2.1* 2.3* 2.2*    No results for input(s): LIPASE, AMYLASE in the last 168 hours. No results for input(s): AMMONIA in the last 168 hours. Coagulation Profile: No results for input(s): INR, PROTIME in the last 168 hours. Cardiac Enzymes: Recent Labs  Lab 08/14/21 1700  CKTOTAL 25*    BNP (last 3 results) No results for input(s): PROBNP in the last 8760 hours. HbA1C: No results for input(s): HGBA1C in the last 72 hours. CBG: Recent Labs  Lab 08/19/21 1941 08/19/21 2338 08/20/21 0356 08/20/21 0736 08/20/21 1137  GLUCAP 186* 254* 236* 162* 194*    Lipid Profile: No results for input(s): CHOL, HDL, LDLCALC, TRIG, CHOLHDL, LDLDIRECT in the last 72 hours.  Thyroid Function Tests: No results for input(s): TSH, T4TOTAL, FREET4, T3FREE, THYROIDAB in the last 72 hours. Anemia Panel: No results for input(s): VITAMINB12, FOLATE, FERRITIN, TIBC, IRON, RETICCTPCT in the last 72 hours. Sepsis Labs: Recent Labs  Lab 08/19/21 1412  PROCALCITON 0.51     Recent Results (from the past 240 hour(s))  Culture,  blood (routine x 2)     Status: None   Collection Time: 08/12/21  8:19 AM   Specimen: BLOOD  Result Value Ref Range Status   Specimen Description BLOOD LEFT ANTECUBITAL  Final   Special Requests   Final    BOTTLES DRAWN AEROBIC AND ANAEROBIC Blood Culture adequate volume   Culture   Final    NO GROWTH 5 DAYS Performed at [facility], [address], [place], [place] [address]    Report Status 08/17/2021 FINAL  Final  Culture, blood (routine x 2)     Status: None   Collection Time: 08/12/21  8:20 AM   Specimen: BLOOD LEFT HAND  Result Value Ref Range Status   Specimen Description BLOOD LEFT HAND  Final   Special Requests   Final    BOTTLES DRAWN AEROBIC ONLY Blood Culture results may not be optimal due to an inadequate volume of blood received in culture bottles   Culture   Final    NO GROWTH 5 DAYS Performed at [facility], [place] [address], [place], [place] [address]    Report Status 08/17/2021 FINAL  Final  Culture, Respiratory w Gram Stain     Status: None   Collection Time: 08/13/21  4:30 PM   Specimen: Tracheal Aspirate; Respiratory  Result Value Ref Range Status   Specimen Description TRACHEAL ASPIRATE  Final   Special Requests Normal  Final   Gram Stain   Final    NO SQUAMOUS EPITHELIAL CELLS SEEN FEW WBC SEEN NO ORGANISMS SEEN    Culture   Final    NO GROWTH 2 DAYS Performed at [facility], [address], [place], [place] [address]    Report Status 08/15/2021 FINAL  Final  Body fluid culture w Gram Stain     Status: None   Collection Time: 08/14/21  8:15 AM   Specimen: Pleural Fluid  Result Value Ref Range Status   Specimen Description FLUID  Final   Special Requests  PLEURAL FLUID  Final   Gram Stain   Final    MODERATE WBC PRESENT, PREDOMINANTLY MONONUCLEAR NO ORGANISMS SEEN    Culture   Final    NO GROWTH 3 DAYS Performed at [facility], [place] [address], [place], [place] [address]    Report Status 08/17/2021 FINAL  Final        Radiology Studies: DG CHEST PORT 1 VIEW  Result Date: 08/18/2021 CLINICAL DATA:  Dyspnea EXAM: PORTABLE CHEST 1 VIEW COMPARISON:  08/14/2021 FINDINGS: Low lung volumes. Persistent left lung opacities with greater opacification at the lung base. New patchy density at the right lung base. Possible left pleural effusion. Normal heart size. Enteric tube is again identified. IMPRESSION: Low lung volumes with persistent left lung pneumonia. Worsening of atelectasis/consolidation at the lung bases. Possible small left pleural effusion. Electronically Signed   By: Macy Mis M.D.   On: 08/18/2021 15:51    Scheduled Meds:  sodium chloride   Intravenous Once   Chlorhexidine Gluconate Cloth  6 each Topical Daily   darbepoetin (ARANESP) injection - NON-DIALYSIS  150 mcg Subcutaneous Q Mon-1800   feeding supplement (PROSource TF)  45 mL Per Tube BID   [START ON 08/21/2021] fluconazole  400 mg Per Tube Daily   free water  200 mL Per Tube Q6H   gabapentin  200 mg Per Tube Q12H   insulin aspart  0-20 Units Subcutaneous Q4H   insulin aspart  8 Units Subcutaneous Q4H   insulin glargine-yfgn  15 Units Subcutaneous BID   lactulose  20 g  Per Tube Daily   lidocaine  2 patch Transdermal Q24H   living well with diabetes book   Does not apply Once   LORazepam  0.5 mg Intravenous Q6H   mouth rinse  15 mL Mouth Rinse BID   melatonin  3 mg Oral QHS   metoprolol tartrate  25 mg Per NG tube BID   mupirocin ointment  1 application Topical BID   pantoprazole (PROTONIX) IV  40 mg Intravenous Q12H   sodium chloride flush  10-40 mL Intracatheter Q12H   sodium chloride flush  3 mL Intravenous Q12H   sucralfate  1 g Per Tube TID WC & HS   traZODone  100 mg Per Tube QHS   venlafaxine  37.5 mg Per Tube Daily   Continuous Infusions:  sodium chloride Stopped (08/19/21 1830)   ampicillin-sulbactam (UNASYN) IV 3 g (08/20/21 1234)   feeding supplement (VITAL 1.5 CAL) 1,000 mL (08/20/21 0600)   promethazine  (PHENERGAN) injection (IM or IVPB) 25 mg (08/10/21 0506)     LOS: 14 days   Time spent: 36 minutes   Darliss Cheney, MD [facility]  08/20/2021, 1:13 PM  Please page via [place] and do not message via secure chat for anything urgent. Secure chat can be used for anything non urgent.  How to contact the [facility] Attending or Consulting provider [place] or covering provider during after hours [place], for this patient?  Check the care team in [facility] and look for a) attending/consulting [facility] provider listed and b) the [facility] team listed. Page or secure chat [phone]. Log into [email or web address] and use [place]'s universal password to access. If you do not have the password, please contact the hospital operator. Locate the [facility] provider you are looking for under [facility] and page to a number that you can be directly reached. If you still have difficulty reaching the provider, please page the [facility] (Director on Call) for the Hospitalists listed on amion for assistance.

## 2021-08-20 NOTE — Progress Notes (Signed)
Subjective:   3000 of UOP   crt trending down now BUN also -   hgb stable after transfusion -    his sodium had gone up so I was giving him free water in the form of d5-  now if taking tube feeds I can give free water  He is more alert - sitting up in chair this AM-- thirsty   Objective Vital signs in last 24 hours: Vitals:   08/20/21 0600 08/20/21 0700 08/20/21 0740 08/20/21 0935  BP: 137/76 (!) 146/77  (!) 169/81  Pulse: 100 95  (!) 113  Resp: (!) 22 (!) 23    Temp:   98.4 F (36.9 C)   TempSrc:   Oral   SpO2: 96% 95%    Weight:      Height:       Weight change: -0.1 kg  Intake/Output Summary (Last 24 hours) at 08/20/2021 1005 Last data filed at 08/20/2021 7341 Gross per 24 hour  Intake 2583.03 ml  Output 3075 ml  Net -491.97 ml    Assessment/ Plan: Pt is a 45 y.o. yo male CKD ( crt 1.5-2) , DM, HTN who was admitted on 08/06/2021 with MSSA bacteremia/osteomyelitis- s/p T4/5 laminectomy, pediculetomy and spinal cord decompression-  complicating A on CRF Assessment/Plan: 1. Renal-  baseline crt 1.5-2.  Now with A on CRF in the setting of above-  non oliguric and UOP increasing.  crt solidly trending down - BUN as well. No  need for HD , moving further away from that possibility. clinically improving so hope that we will continue to see improvement - cont to watch 2. HTN/vol-  overloaded but UOP improving on own,   do not need to do diuretics necessarily 3. Anemia-  hgb low but stable-  added on ESA-  no iron due to osteo-  hgb dramatic drop 2 days ago-  2 units given on 10/26-  now on protonix, hgb stable since-  given DDAVP for uremic platelets  4. Hyperkalemia-  supportive treatment for now- with increased UOP I felt like it would normalize on own-  then up in the setting of presumed GIB-  improved again 5. Hypernatremia-  give free water per tube  Jose [name]    Labs: Basic Metabolic Panel: Recent Labs  Lab 08/14/21 1700 08/15/21 0545 08/16/21 0454  08/19/21 1520 08/19/21 2155 08/20/21 0327  NA 137 140   < > 150* 149* 148*  K 4.8 4.6   < > 5.0 4.5 4.6  CL 105 107   < > 117* 116* 118*  CO2 21* 22   < > 21* 21* 22  GLUCOSE 365* 225*   < > 139* 195* 258*  BUN 85* 97*   < > 123* 119* 114*  CREATININE 3.78* 4.23*   < > 3.27* 3.00* 2.94*  CALCIUM 8.2* 8.3*   < > 9.0 9.1 9.1  PHOS 3.7 3.2  --   --   --  4.3   < > = values in this interval not displayed.   Liver Function Tests: Recent Labs  Lab 08/17/21 0400 08/18/21 0334 08/19/21 0430 08/20/21 0327  AST 155* 74* 107*  --   ALT 114* 77* 46*  --   ALKPHOS 715* 636* 596*  --   BILITOT 2.8* 3.0* 2.4*  --   PROT 5.9* 6.3* 6.1*  --   ALBUMIN <1.5* 2.1* 2.3* 2.2*   No results for input(s): LIPASE, AMYLASE in the last 168 hours. No results for input(s):  AMMONIA in the last 168 hours. CBC: Recent Labs  Lab 08/14/21 0801 08/15/21 0545 08/16/21 0454 08/17/21 0400 08/18/21 0334 08/19/21 0430 08/19/21 0520 08/19/21 1520 08/20/21 0327  WBC 26.3*   < > 22.0* 21.4* 21.7* 20.1*  --   --  21.1*  NEUTROABS 21.3*  --   --   --   --   --   --   --  16.1*  HGB 8.1*   < > 8.6* 8.3* 8.1* 5.9* 5.8* 7.9* 7.7*  HCT 25.8*   < > 27.2* 26.5* 26.1* 19.1* 18.1* 25.2* 23.4*  MCV 90.5   < > 91.0 90.8 92.6 92.7  --   --  86.3  PLT 381   < > 381 352 360 298  --   --  338   < > = values in this interval not displayed.   Cardiac Enzymes: Recent Labs  Lab 08/14/21 1700  CKTOTAL 25*   CBG: Recent Labs  Lab 08/19/21 1610 08/19/21 1941 08/19/21 2338 08/20/21 0356 08/20/21 0736  GLUCAP 133* 186* 254* 236* 162*    Iron Studies: No results for input(s): IRON, TIBC, TRANSFERRIN, FERRITIN in the last 72 hours. Studies/Results: DG CHEST PORT 1 VIEW  Result Date: 08/18/2021 CLINICAL DATA:  Dyspnea EXAM: PORTABLE CHEST 1 VIEW COMPARISON:  08/14/2021 FINDINGS: Low lung volumes. Persistent left lung opacities with greater opacification at the lung base. New patchy density at the right lung base.  Possible left pleural effusion. Normal heart size. Enteric tube is again identified. IMPRESSION: Low lung volumes with persistent left lung pneumonia. Worsening of atelectasis/consolidation at the lung bases. Possible small left pleural effusion. Electronically Signed   By: Macy Mis M.D.   On: 08/18/2021 15:51   Medications: Infusions:  sodium chloride Stopped (08/19/21 1830)   ampicillin-sulbactam (UNASYN) IV Stopped (08/20/21 0528)   feeding supplement (VITAL 1.5 CAL) 1,000 mL (08/20/21 0600)   promethazine (PHENERGAN) injection (IM or IVPB) 25 mg (08/10/21 0506)    Scheduled Medications:  sodium chloride   Intravenous Once   Chlorhexidine Gluconate Cloth  6 each Topical Daily   darbepoetin (ARANESP) injection - NON-DIALYSIS  150 mcg Subcutaneous Q Mon-1800   feeding supplement (PROSource TF)  45 mL Per Tube BID   fluconazole  200 mg Per Tube Daily   gabapentin  200 mg Per Tube Q12H   insulin aspart  0-20 Units Subcutaneous Q4H   insulin aspart  8 Units Subcutaneous Q4H   insulin glargine-yfgn  15 Units Subcutaneous BID   lactulose  20 g Per Tube Daily   lidocaine  2 patch Transdermal Q24H   living well with diabetes book   Does not apply Once   LORazepam  0.5 mg Intravenous Q6H   mouth rinse  15 mL Mouth Rinse BID   melatonin  3 mg Oral QHS   metoprolol tartrate  25 mg Per NG tube BID   mupirocin ointment  1 application Topical BID   pantoprazole (PROTONIX) IV  40 mg Intravenous Q12H   sodium chloride flush  10-40 mL Intracatheter Q12H   sodium chloride flush  3 mL Intravenous Q12H   sucralfate  1 g Per Tube TID WC & HS   traZODone  100 mg Per Tube QHS   venlafaxine  37.5 mg Per Tube Daily    have reviewed scheduled and prn medications.  Physical Exam: General:  eyes open-  restless-  verbalizing more-   Heart: RRR Lungs: poor effort, CBS Abdomen: soft, slightly tender Extremities: pitting edema  but better      08/20/2021,10:05 AM  LOS: 14 days

## 2021-08-20 NOTE — Progress Notes (Signed)
PHARMACY NOTE:  ANTIMICROBIAL RENAL DOSAGE ADJUSTMENT  Current antimicrobial regimen includes a mismatch between antimicrobial dosage and estimated renal function.  As per policy approved by the Pharmacy & Therapeutics and Medical Executive Committees, the antimicrobial dosage will be adjusted accordingly.  Current antimicrobial dosage:  fluconazole 200mg  po daily  Indication: esophageal candidiasis   Renal Function:  Estimated Creatinine Clearance: 36 mL/min (A) (by C-G formula based on SCr of 2.94 mg/dL (H)). []      On intermittent HD, scheduled: []      On CRRT    Antimicrobial dosage has been changed to:  fluconazole 400mg  po daily   Thank you for allowing pharmacy to be a part of this patient's care.  Levonne Spiller, [facility] 08/20/2021 11:05 AM

## 2021-08-20 NOTE — Progress Notes (Signed)
NAME:  Jose [name], MRN:  [id number], DOB:  [birth year], LOS: 33 ADMISSION DATE:  08/06/2021, CONSULTATION DATE:  08/10/2021 REFERRING MD:  TRH, CHIEF COMPLAINT:  Aspiration/Anemia   BRIEF HISTORY:  Jose [name] is a 45 y.o. 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 On 10/17 patient developed coffee-ground emesis, now s/p EGD showing circumferential esophagitis with hemorrhage and non-bleeding duodenal ulcers now on BID PPI. On 10/17, pccm was consulted for acute hypoxia.    PCCM reconsulted due to respiratory distress and acute drop in Hgb to 5.8.  PAST MEDICAL HISTORY:   has a past medical history of GAD (generalized anxiety disorder), Hyperlipidemia, Macular degeneration, bilateral, Retinopathy, and Type II diabetes mellitus with complication, uncontrolled. SIGNIFICANT HOSPITAL EVENTS:  10/13 -admitted with worsening thoracic osteomyelitis, constipation/abd distention, neurosurgery consulted  10/14 - T4-T5 laminectomy, left T5 pediculotomy, and spinal cord decompression 10/17 - Hgb drop to 6.2, coffee ground emesis s/p EGD with esophagitis and peptic ulcer 10/17 - hypoxia and pccm consult  10/19 - Intubated 10/21 - S/p Thoracentesis   10/24 - Extubated 10/26 - PCCM re-consulted for acute drop in Hgb, no blood in stools  CONSULTS:  Internal Medicine, Neurosurgeon, Infectious disease, nephrology  PROCEDURES:  10/14 - T4-T5 laminectomy, left T5 pediculotomy, and spinal cord decompression 10/17 - EGD 10/19 - Intubation 10/21 - thoracentesis   SIGNIFICANT DIAGNOSTIC TESTS:   CT  Abd/Pelvis: Result Date: 08/06/2021 IMPRESSION: 1. Generally large burden of stool and stool balls throughout the colon. 2. Distended urinary bladder, measuring up to 19.0 cm. Correlate for urinary retention. 3. Anasarca.   CTA: Result Date: 08/06/2021 1. Negative examination for pulmonary embolism. 2. Moderate bilateral pleural effusions and associated atelectasis or consolidation, new compared to prior examination. 3. There is substantial interval worsening of destructive lytic change about the T4-T5 disc space. Overlying paravertebral soft tissue thickening. Findings are consistent with worsened discitis osteomyelitis but in general better assessed by same-day MR. 4. Unchanged enlarged right hilar and subcarinal lymph nodes, likely reactive. 5. Cardiomegaly and coronary artery disease.   MR Thoracic Spine: Result Date: 08/06/2021 1. Redemonstrated abnormal bone marrow signal and enhancement involving the T4 and T5 levels with increased surrounding paraspinal phlegmon and new epidural phlegmon (6 mm thick ventrally). Interval T5 pathologic fracture (50-60% height loss) with new 4 mm of bony retropulsion. Constellation of findings results in new moderate to severe left eccentric canal stenosis at T4 with cord deformity. 2. Overall, findings are most likely secondary to progressive discitis/osteomyelitis. Superimposed osseous metastatic disease or multiple myeloma is not excluded by imaging. Recommend correlation with the results of reported recent biopsy. 3. Multiple small (subcentimeter) fluid collections in the left posterior paraspinal phlegmon could represent tiny abscesses   DG CHEST PORT 1 VIEW Result Date: 08/10/2021 IMPRESSION: Extensive airspace opacity throughout the left lung field. Comparatively mild opacity within the right lung base. Given the asymmetry, these findings likely reflect multifocal pneumonia. Small left pleural effusion.    EGD: Result Date: 08/10/2021 LA Grade  D (one or more mucosal breaks involving at least 75% of esophageal circumference) esophagitis with no bleeding was found in the entire esophagus. Biopsies were taken with a cold forceps for histology. Findings:  Hematin (altered blood/coffee-ground-like material) was found in the gastric body. No gross lesions were noted in the entire examined stomach. Biopsies were taken with a cold forceps for histology. Few non-bleeding superficial duodenal ulcers with no stigmata of bleeding were found in the duodenal bulb. The largest lesion was 15 mm in largest dimension. Biopsies were taken with a cold forceps for histology. The second portion of the duodenum was normal.   CT HEAD WO CONTRAST (5MM) Result Date: 08/11/2021 IMPRESSION: No acute intracranial process.    DG CHEST PORT 1 VIEW Result Date: 08/11/2021 IMPRESSION: No change from the prior study.    DG CHEST PORT 1 VIEW   Result Date: 08/18/2021 Low lung volumes with persistent left lung pneumonia. Worsening of atelectasis/consolidation at the lung bases. Possible small left pleural effusion.   MICRO DATA:  10/13 - Bcx no growth x5d 10/13 - Ucx no growth 10/18 - resp culture pending 10/21 - pleural cx negative   ANTIMICROBIALS:  Cefazolin 10/13> Fluconazole 10/20> Unasyn 10/18-10/24 Cefepime 10/13-10/13  Zosyn 10/17-10/17  Vancomycin 10/17-10  INTERIM HISTORY/SUBJECTIVE:  Overnight events: had improvement of his hgb with PRBC transfusion.  Remains encephalopathic.  Patient resting in bed.  He is alert to himself but still encephalopathic.  He is tremulous and diaphoretic. OBJECTIVE:  Blood pressure 137/76, pulse 100, temperature 98.5 F (36.9 C), temperature source Oral, resp. rate (!) 22, height 5' 11"  (1.803 m), weight 91.9 kg, SpO2 96 %.        Intake/Output Summary (Last 24 hours) at 08/20/2021 0711 Last data filed at 08/20/2021 0600 Gross per 24 hour  Intake 2777.72 ml  Output 3075 ml  Net -297.28 ml   Filed  Weights   08/18/21 0238 08/19/21 0400 08/20/21 0442  Weight: 100.1 kg 92 kg 91.9 kg    Examination: Physical Exam Constitutional:      Appearance: He is ill-appearing and diaphoretic.  HENT:     Head: Normocephalic and atraumatic.     Mouth/Throat:     Pharynx: Oropharynx is clear.  Eyes:     Extraocular Movements: Extraocular movements intact.     Pupils: Pupils are equal, round, and reactive to light.  Cardiovascular:     Rate and Rhythm: Regular rhythm. Tachycardia present.     Pulses: Normal pulses.     Heart sounds: No murmur heard. Pulmonary:     Effort: Pulmonary effort is normal.     Breath sounds: Rales (left>right) present.  Abdominal:     General: There is distension.     Palpations: Abdomen is soft.     Tenderness: There is no abdominal tenderness.  Musculoskeletal:        General: Normal range of motion.     Cervical back: Rigidity present.  Skin:    General: Skin is warm.  Neurological:     General: No focal deficit present.     Mental Status: He is alert. He is disoriented.     Motor: Weakness present.   Patient Lines/Drains/Airways Status     Active Line/Drains/Airways     Name Placement date Placement time Site Days   PICC Single Lumen 07/31/21 Right Brachial 40 cm 0 cm 07/31/21  1034  Brachial  20   Urethral Catheter Meghan, RN Straight-tip 16 Fr. 08/14/21  1428  Straight-tip  6   Incision (Closed) 08/07/21 Back 08/07/21  1803  -- 13   Small Bore Feeding Tube 10 Fr. Left nare Marking at nare/corner of mouth 69 cm 08/12/21  1133  Left nare  8   Pressure Injury 08/10/21 Coccyx Medial Stage 2 -  Partial thickness loss of dermis presenting as a shallow open injury with a red, pink wound bed without slough. 08/10/21  1700  -- 10   Pressure Injury 08/14/21 Buttocks Bilateral Deep Tissue Pressure Injury - Purple or maroon localized area of discolored intact skin or blood-filled blister due to damage of underlying soft tissue from pressure and/or shear. DTI  08/14/21  0800  -- 6   Wound / Incision (Open or Dehisced) 07/22/21 Diabetic ulcer Toe (Comment  which one) Left Big toe ulcer 07/22/21  --  Toe (Comment  which one)  [place]:    ASSESSMENT & PLAN:  Persistent Respiratory distress, multifactorial Decreased lung volumes and atelectasis 2/2 to pain  Possible persistent pneumonia/aspiration pneumonitis, worse left-sided consolidation  Patient having some mild respiratory distress, but is saturating well on 6L [place]. Denies any thick secretions or cough, but did have an episode of emesis yesterday concerning for aspiration. He does have distant lung sounds and crackles consolidation/atelectasis.  Persistent leukocytosis but trending downward slowly and intermittent fevering. - Cont supplemental O2 as needed - ABG if requires more O2 - Cont IV AB per ID, consider broadening  - Will follow along in case of worsening respiratory distress.   Iron deficiency anemia: Candidal esophagitis and PUD s/p EGD Patient had an acute drop in hgb from 8.1 to 5.9. that improved with transfusion. Likely 2/2 to esophagitis and duodenal ulcer.  - If he starts to develop melana or hematochezia and persistent hgb< 7 then will  need repeat EGD. - Transfuse <7   Uremic encephalopathy  Patient remains encephalopathic 2/2 to uremia.  MSSA osteomyelitis on T4/T5: - continue AB aand management per ID/ primary team.  - Transition to IV pain and antispasmodic medications for now.    T2DM: - On SSI prn - Management per primary team   ATN on CKD3a: Uremia Hyperkalemia: Patient encephalopathic likely secondary to uremia.  His BUN remains markedly elevated which is likely compounded by his upper GI bleed.  Had some hyperkalemia yesterday requiring 1 dose of Lokelma with improvement this morning at 4.6 .  Continues to have slow improvement of his kidney function with good urine output overnight of 2.8 L.  May still require renal  replacement therapy in the near future, will defer to nephrology. -Kidney function electrolytes daily -Monitor urine output -Avoid nephrotoxic medications   HTN: - Transition oral HTN medications ot IV when possible   BEST PRACTICE:  Diet: tubefeeds and NPO Pain/Anxiety/Delirium protocol (if indicated): yes VAP protocol (if indicated): no DVT prophylaxis: SCD GI prophylaxis: PPI Glucose control: SSI Yes Lines: N/A Foley:  Yes, and it is still needed Mobility: bed rest, Code Status: full code Family Communication: yesterday at bedside Disposition: Stepdown  Labs   CBC: Recent Labs  Lab 08/14/21 0801 08/15/21 0545 08/16/21 0454 08/17/21 0400 08/18/21 0334 08/19/21 0430 08/19/21 0520 08/19/21 1520 08/20/21 0327  WBC 26.3*   < > 22.0* 21.4* 21.7* 20.1*  --   --  21.1*  NEUTROABS 21.3*  --   --   --   --   --   --   --  16.1*  HGB 8.1*   < > 8.6* 8.3* 8.1* 5.9* 5.8* 7.9* 7.7*  HCT 25.8*   < > 27.2* 26.5* 26.1* 19.1* 18.1* 25.2* 23.4*  MCV 90.5   < > 91.0 90.8 92.6 92.7  --   --  86.3  PLT 381   < > 381 352 360 298  --   --  338   < > = values in this interval not displayed.    Basic Metabolic Panel: Recent Labs  Lab 08/13/21 1700 08/14/21 0415 08/14/21 0801 08/14/21 1700 08/15/21 0545 08/16/21 0454 08/18/21 0334 08/19/21 0430 08/19/21 1520 08/19/21 2155 08/20/21 0327  NA  --   --    < > 137 140   < > 142 146* 150* 149* 148*  K  --   --    < > 4.8 4.6   < > 5.0 6.1* 5.0 4.5 4.6  CL  --   --    < > 105 107   < > 111 116* 117* 116* 118*  CO2  --   --    < > 21* 22   < > 21* 23 21* 21* 22  GLUCOSE  --   --    < > 365* 225*   < > 244* 281* 139* 195* 258*  BUN  --   --    < > 85* 97*   < > 106* 130* 123* 119* 114*  CREATININE  --   --    < > 3.78* 4.23*   < > 3.73* 3.53* 3.27* 3.00* 2.94*  CALCIUM  --   --    < > 8.2* 8.3*   < > 8.9 8.8* 9.0 9.1 9.1  MG 2.4 2.5*  --  2.6* 2.6*  --   --   --   --   --   --   PHOS 3.5 4.2  --  3.7 3.2  --   --   --   --   --   4.3   < > = values in this interval not displayed.   GFR: Estimated Creatinine Clearance: 36 mL/min (A) (by C-G formula based on SCr of 2.94 mg/dL (H)). Recent Labs  Lab 08/17/21 0400 08/18/21 0334 08/19/21 0430 08/19/21 1412 08/20/21 0327  PROCALCITON  --   --   --  0.51  --   WBC 21.4* 21.7* 20.1*  --  21.1*    Liver Function Tests: Recent Labs  Lab 08/15/21 0545 08/16/21 0454 08/17/21 0400 08/18/21 0334 08/19/21 0430 08/20/21 0327  AST 580* 253* 155* 74* 107*  --   ALT 211* 157* 114* 77* 46*  --   ALKPHOS 804* 836* 715* 636* 596*  --   BILITOT 1.7* 2.3* 2.8* 3.0* 2.4*  --   PROT 5.7* 5.5* 5.9* 6.3* 6.1*  --   ALBUMIN <1.5* <1.5* <1.5* 2.1* 2.3* 2.2*   No results for input(s): LIPASE, AMYLASE in the last 168 hours. No results for input(s): AMMONIA in the last 168 hours.  ABG    Component Value Date/Time   PHART 7.388 08/13/2021 1041   PCO2ART 38.6 08/13/2021 1041   PO2ART 94 08/13/2021 1041   HCO3 23.0 08/13/2021 1041   TCO2 24 08/13/2021 1041   ACIDBASEDEF 2.0 08/13/2021 1041   O2SAT 97.0 08/13/2021 1041     Coagulation Profile: No results for input(s): INR, PROTIME in the last 168 hours.  Cardiac Enzymes: Recent Labs  Lab 08/14/21 1700  CKTOTAL 25*    HbA1C: Hemoglobin A1C  Date/Time Value Ref Range Status  06/11/2021 07:49 AM 7.5 (A) 4.0 - 5.6 % Final  03/10/2021 09:39 AM 7.0 (A) 4.0 - 5.6 % Final   Hgb A1c MFr Bld  Date/Time Value Ref Range Status  05/19/2021 04:31 AM 7.7 (  H) 4.8 - 5.6 % Final    Comment:    (NOTE)         Prediabetes: 5.7 - 6.4         Diabetes: >6.4         Glycemic control for adults with diabetes: <7.0   11/22/2019 10:12 AM 7.2 (H) 4.8 - 5.6 % Final    Comment:             Prediabetes: 5.7 - 6.4          Diabetes: >6.4          Glycemic control for adults with diabetes: <7.0     CBG: Recent Labs  Lab 08/19/21 1132 08/19/21 1610 08/19/21 1941 08/19/21 2338 08/20/21 0356  GLUCAP 85 133* 186* 254* 236*     Review of Systems:   See above  Past Medical History  He,  has a past medical history of GAD (generalized anxiety disorder), Hyperlipidemia, Macular degeneration, bilateral, Retinopathy, and Type II diabetes mellitus with complication, uncontrolled.   Surgical History    Past Surgical History:  Procedure Laterality Date   APPENDECTOMY     BIOPSY  08/10/2021   Procedure: BIOPSY;  Surgeon: Otis Brace, MD;  Location: [facility];  Service: Gastroenterology;;   BUBBLE STUDY  07/29/2021   Procedure: BUBBLE STUDY;  Surgeon: Jerline Pain, MD;  Location: [facility];  Service: Cardiovascular;;   ESOPHAGOGASTRODUODENOSCOPY (EGD) WITH PROPOFOL N/A 08/10/2021   Procedure: ESOPHAGOGASTRODUODENOSCOPY (EGD) WITH PROPOFOL;  Surgeon: Otis Brace, MD;  Location: [facility];  Service: Gastroenterology;  Laterality: N/A;   HERNIA REPAIR     IR FLUORO GUIDED NEEDLE PLC ASPIRATION/INJECTION LOC  07/28/2021   LUMBAR LAMINECTOMY/DECOMPRESSION MICRODISCECTOMY N/A 08/07/2021   Procedure: THORACIC FOUR - THORACIC FIVE LAMINECTOMY/DECOMPRESSION OF SPINAL CORD, DEBRIDEMENT OF ABSCESS, MICRODISCECTOMY, INTRAOPERATIVE ULTRASOUND;  Surgeon: Consuella Lose, MD;  Location: [facility];  Service: Neurosurgery;  Laterality: N/A;   TEE WITHOUT CARDIOVERSION N/A 07/29/2021   Procedure: TRANSESOPHAGEAL ECHOCARDIOGRAM (TEE);  Surgeon: Jerline Pain, MD;  Location[facility];  Service: Cardiovascular;  Laterality: N/A;   TRIGGER FINGER RELEASE Right 10/25/2019   Procedure: RIGHT INDEX FINGER RELEASE TRIGGER FINGER/A-1 PULLEY;  Surgeon: Daryll Brod, MD;  Location: [facility];  Service: Orthopedics;  Laterality: Right;  IV REGIONAL FOREARM BLOCK     Social History   reports that he quit smoking about 12 months ago. His smoking use included cigarettes. He has a 20.00 pack-year smoking history. He has never used smokeless tobacco. He reports that he does not currently use alcohol. He reports  that he does not use drugs.   Family History   His family history includes ADD / ADHD in his brother and son; Diabetes in his father and mother; Hyperlipidemia in his brother and mother; Stroke in his brother and mother.   Allergies Allergies  Allergen Reactions   Cranberry Itching     Home Medications  Prior to Admission medications   Medication Sig Start Date End Date Taking? Authorizing Provider  acetaminophen (TYLENOL) 500 MG tablet Take 500 mg by mouth every 6 (six) hours as needed for mild pain, fever or headache.   Yes [provider]  albuterol (VENTOLIN HFA) 108 (90 Base) MCG/ACT inhaler Inhale 1 puff into the lungs every 4 (four) hours as needed for wheezing or shortness of breath. 05/27/21  Yes [provider]  ALPRAZolam (XANAX) 0.25 MG tablet Take 0.25 mg by mouth 2 (two) times daily as needed for anxiety. 06/12/21  Yes [provider]  amLODipine (NORVASC) 10 MG tablet Take 1 tablet (10 mg total) by mouth daily. 08/03/21  Yes Cristal Deer, MD  Ascorbic Acid (VITAMIN C PO) Take 1 tablet by mouth daily.   Yes [provider]  atorvastatin (LIPITOR) 40 MG tablet Take 1 tablet (40 mg total) by mouth daily. 03/12/19  Yes Jacelyn Pi, Lilia Argue, MD  ceFAZolin (ANCEF) IVPB Inject 2 g into the vein every 8 (eight) hours. Indication:  Osteomyelitis  First Dose: No Last Day of Therapy:  09/04/2022  Labs - Once weekly:  CBC/D and BMP, Labs - Every other week:  ESR and CRP Method of administration: IV Push Method of administration may be changed at the discretion of home infusion pharmacist based upon assessment of the patient and/or caregiver's ability to self-administer the medication ordered. 07/31/21  Yes Cristal Deer, MD  chlorproMAZINE (THORAZINE) 25 MG tablet Take 1 tablet (25 mg total) by mouth 3 (three) times daily as needed for hiccoughs. 08/03/21  Yes Cristal Deer, MD  collagenase (SANTYL) ointment Apply topically daily. 08/03/21  Yes  Cristal Deer, MD  cyclobenzaprine (FLEXERIL) 10 MG tablet Take 1 tablet (10 mg total) by mouth 3 (three) times daily. 08/03/21  Yes Cristal Deer, MD  Dulaglutide (TRULICITY) 3 LK/4.4WN SOPN Inject 3 mg as directed once a week. Patient taking differently: Inject 3 mg as directed once a week. thursday 06/11/21  Yes Renato Shin, MD  feeding supplement, GLUCERNA SHAKE, (GLUCERNA SHAKE) LIQD Take 237 mLs by mouth 3 (three) times daily between meals. 08/03/21  Yes Cristal Deer, MD  gabapentin (NEURONTIN) 300 MG capsule TAKE 1 TO 2 CAPSULES(300 TO 600 MG) BY MOUTH AT BEDTIME Patient taking differently: Take 300 mg by mouth 2 (two) times daily. 12/01/19  Yes Jacelyn Pi, Lilia Argue, MD  glimepiride (AMARYL) 1 MG tablet Take 1 tablet (1 mg total) by mouth daily with breakfast. 01/08/21  Yes Renato Shin, MD  hydrALAZINE (APRESOLINE) 50 MG tablet Take 1 tablet (50 mg total) by mouth 3 (three) times daily. 08/03/21  Yes Cristal Deer, MD  lactulose (CHRONULAC) 10 GM/15ML solution Take 30 mLs (20 g total) by mouth 2 (two) times daily. 08/03/21  Yes Cristal Deer, MD  Multiple Vitamin (MULTIVITAMIN ADULT PO) Take 1 tablet by mouth daily.   Yes [provider]  mupirocin ointment (BACTROBAN) 2 % Apply 1 application topically 2 (two) times daily. 08/03/21  Yes Cristal Deer, MD  Oxycodone HCl 10 MG TABS Take 10 mg by mouth every 3 (three) hours as needed for pain. 08/03/21  Yes [provider]  senna-docusate (SENOKOT-S) 8.6-50 MG tablet Take 1 tablet by mouth 2 (two) times daily. 08/03/21  Yes Cristal Deer, MD  tadalafil (CIALIS) 20 MG tablet TK 1 T PO  PO QD PRN FOR ERECTILE DYSFUNCTION Patient taking differently: Take 20 mg by mouth daily as needed for erectile dysfunction. 07/20/19  Yes Jacelyn Pi, Irma M, MD  tobramycin (TOBREX) 0.3 % ophthalmic solution Place 1 drop into both eyes See admin instructions. Begin 1 day prior to treatment and continue the day of treatment  and for one full day after treatment. 07/05/18  Yes [provider]  traZODone (DESYREL) 100 MG tablet TAKE 1 TABLET(100 MG) BY MOUTH AT BEDTIME AS NEEDED FOR SLEEP Patient taking differently: Take 100 mg by mouth at bedtime. 03/05/21  Yes Ravi, Himabindu, MD  VITAMIN D PO Take 1 tablet by mouth daily.   Yes [provider]  DULoxetine (CYMBALTA) 60  MG capsule TAKE 1 CAPSULE(60 MG) BY MOUTH DAILY Patient not taking: No sig reported 03/05/21   Einar Grad, Himabindu, MD  Ranibizumab (LUCENTIS) 0.3 MG/0.05ML SOLN 1 Dose by Intravitreal route every 3 (three) months.    [provider]     Critical care time: N/A    [name], D.O.  Internal Medicine Resident, PGY-3 [facility]  Pager: [phone] 7:11 AM, 08/20/2021

## 2021-08-20 NOTE — Progress Notes (Signed)
Speech Language Pathology Treatment: Dysphagia  Patient Details Name: BRINTON BRANDEL MRN: [id number] DOB: [birth year] Today's Date: 08/20/2021 Time: 1210-1228 SLP Time Calculation (min) (ACUTE ONLY): 18 min  Assessment / Plan / Recommendation Clinical Impression  Pt was seen for dysphagia treatment with his wife present for the session. Pt's wife initially requested that treatment be deferred until later since the pt would be having his beard cut within ~15 minutes and she did not want there to be any food in the pt's facial hair. However, pt subsequently requested ice chips and water. These were given by the pt's wife and pt was reclined for the initial bolus of water. Pt and his wife were educated on optimal positioning to reduce aspiration risk and both parties verbalized understanding. Pt demonstrated eructation and coughing after intake of the second bolus of thin liquids via straw. Blood and blood-tinged secretions were suctioned by SLP and Solmon Ice, RN during coughing; oral mucosa and tongue were devoid of contributing lesions. During this episode, pt demonstrated moaning, leaning to the left, desaturation to 87% and RR increased to up to 38; however, he was responsive to encouragement and soothing from his wife. Pt's RN left to contact MD, and SLP ultimately left the pt in his wife's care when he was more calm and SpO2 and RR had returned to 92% and <30, respectively. Pt's RN was advised to make the pt NPO if he is symptomatic of aspiration with p.o. intake and SLP will plan to complete MBS on 10/28 pending pt's ability to tolerate this.    HPI HPI: 45 y.o. male admitted on 08/06/2021 with worsening back pain, constipation, abdominal distention . 10/17 incidence of coffee ground emesis with EGD revealing "esophagitis with no bleeding and a nonbleeding duodenal ulcer on Protonix". Pt developed acute hypoxic respiratory failure ETT 10/19-10/24. CXR (10/21) significant for "slightly improved extensive  patchy airspace opacities in the left lung, compatible with pneumonia". PMhx: HTN, with a recent COVID-19 infection, macular degeneration, diabetes mellitus type 2,CKD stage III, recent admission for MSSA bacteremia/osteomyelitis status post T4-T5 laminectomy and spinal cord decompression on 08/07/2021.      SLP Plan  MBS      Recommendations for follow up therapy are one component of a multi-disciplinary discharge planning process, led by the attending physician.  Recommendations may be updated based on patient status, additional functional criteria and insurance authorization.    Recommendations  Diet recommendations: Dysphagia 1 (puree);Thin liquid Liquids provided via: Cup;Straw Medication Administration: Via alternative means (or crushed with puree) Supervision: Staff to assist with self feeding;Full supervision/cueing for compensatory strategies Compensations: Minimize environmental distractions;Slow rate;Small sips/bites Postural Changes and/or Swallow Maneuvers: Seated upright 90 degrees                Oral Care Recommendations: Staff/trained caregiver to provide oral care;Oral care BID Follow up Recommendations: Other (comment) (TBD) SLP Visit Diagnosis: Dysphagia, unspecified (R13.10) Plan: MBS       Jalene Demo I. Hardin Negus, [place], [place] Office number [phone] Pager [place]  08/20/2021, 2:02 PM

## 2021-08-21 ENCOUNTER — Inpatient Hospital Stay (HOSPITAL_COMMUNITY): Payer: 59

## 2021-08-21 ENCOUNTER — Encounter (HOSPITAL_COMMUNITY): Payer: Self-pay | Admitting: Radiology

## 2021-08-21 DIAGNOSIS — R609 Edema, unspecified: Secondary | ICD-10-CM | POA: Diagnosis not present

## 2021-08-21 DIAGNOSIS — M7989 Other specified soft tissue disorders: Secondary | ICD-10-CM

## 2021-08-21 DIAGNOSIS — M4624 Osteomyelitis of vertebra, thoracic region: Secondary | ICD-10-CM | POA: Diagnosis not present

## 2021-08-21 LAB — RENAL FUNCTION PANEL
Albumin: 2.1 g/dL — ABNORMAL LOW (ref 3.5–5.0)
Anion gap: 9 (ref 5–15)
BUN: 96 mg/dL — ABNORMAL HIGH (ref 6–20)
CO2: 23 mmol/L (ref 22–32)
Calcium: 9 mg/dL (ref 8.9–10.3)
Chloride: 120 mmol/L — ABNORMAL HIGH (ref 98–111)
Creatinine, Ser: 2.26 mg/dL — ABNORMAL HIGH (ref 0.61–1.24)
GFR, Estimated: 35 mL/min — ABNORMAL LOW (ref >60)
Glucose, Bld: 179 mg/dL — ABNORMAL HIGH (ref 70–99)
Phosphorus: 4.6 mg/dL (ref 2.5–4.6)
Potassium: 4.1 mmol/L (ref 3.5–5.1)
Sodium: 152 mmol/L — ABNORMAL HIGH (ref 135–145)

## 2021-08-21 LAB — GLUCOSE, CAPILLARY
Glucose-Capillary: 168 mg/dL — ABNORMAL HIGH (ref 70–99)
Glucose-Capillary: 143 mg/dL — ABNORMAL HIGH (ref 70–99)
Glucose-Capillary: 175 mg/dL — ABNORMAL HIGH (ref 70–99)
Glucose-Capillary: 83 mg/dL (ref 70–99)
Glucose-Capillary: 86 mg/dL (ref 70–99)
Glucose-Capillary: 206 mg/dL — ABNORMAL HIGH (ref 70–99)

## 2021-08-21 LAB — POCT I-STAT 7, (LYTES, BLD GAS, ICA,H+H)
Acid-Base Excess: 0 mmol/L (ref 0.0–2.0)
Bicarbonate: 23.2 mmol/L (ref 20.0–28.0)
Calcium, Ion: 1.26 mmol/L (ref 1.15–1.40)
HCT: 23 % — ABNORMAL LOW (ref 39.0–52.0)
Hemoglobin: 7.8 g/dL — ABNORMAL LOW (ref 13.0–17.0)
O2 Saturation: 96 %
Patient temperature: 98
Potassium: 4.2 mmol/L (ref 3.5–5.1)
Sodium: 156 mmol/L — ABNORMAL HIGH (ref 135–145)
TCO2: 24 mmol/L (ref 22–32)
pCO2 arterial: 28.7 mmHg — ABNORMAL LOW (ref 32.0–48.0)
pH, Arterial: 7.514 — ABNORMAL HIGH (ref 7.350–7.450)
pO2, Arterial: 71 mmHg — ABNORMAL LOW (ref 83.0–108.0)

## 2021-08-21 LAB — CBC WITH DIFFERENTIAL/PLATELET
Abs Immature Granulocytes: 0.33 10*3/uL — ABNORMAL HIGH (ref 0.00–0.07)
Basophils Absolute: 0.1 10*3/uL (ref 0.0–0.1)
Basophils Relative: 0 %
Eosinophils Absolute: 0.7 10*3/uL — ABNORMAL HIGH (ref 0.0–0.5)
Eosinophils Relative: 3 %
HCT: 24.4 % — ABNORMAL LOW (ref 39.0–52.0)
Hemoglobin: 8 g/dL — ABNORMAL LOW (ref 13.0–17.0)
Immature Granulocytes: 1 %
Lymphocytes Relative: 8 %
Lymphs Abs: 1.8 10*3/uL (ref 0.7–4.0)
MCH: 28.9 pg (ref 26.0–34.0)
MCHC: 32.8 g/dL (ref 30.0–36.0)
MCV: 88.1 fL (ref 80.0–100.0)
Monocytes Absolute: 1.9 10*3/uL — ABNORMAL HIGH (ref 0.1–1.0)
Monocytes Relative: 8 %
Neutro Abs: 18.9 10*3/uL — ABNORMAL HIGH (ref 1.7–7.7)
Neutrophils Relative %: 80 %
Platelets: 337 10*3/uL (ref 150–400)
RBC: 2.77 MIL/uL — ABNORMAL LOW (ref 4.22–5.81)
RDW: 17.4 % — ABNORMAL HIGH (ref 11.5–15.5)
WBC: 23.7 10*3/uL — ABNORMAL HIGH (ref 4.0–10.5)
nRBC: 0.1 % (ref 0.0–0.2)

## 2021-08-21 LAB — OCCULT BLOOD X 1 CARD TO LAB, STOOL: Fecal Occult Bld: POSITIVE — AB

## 2021-08-21 LAB — HEMOGLOBIN AND HEMATOCRIT, BLOOD
HCT: 18.1 % — ABNORMAL LOW (ref 39.0–52.0)
Hemoglobin: 5.8 g/dL — CL (ref 13.0–17.0)

## 2021-08-21 LAB — PREPARE RBC (CROSSMATCH)

## 2021-08-21 IMAGING — DX DG ABDOMEN 1V
1 series · 1 of 1 positions shown · non-contrast
Comparison: Abdominal x-ray [DATE]

CLINICAL DATA: Back pain, free air

EXAM:
ABDOMEN - 1 VIEW

[abdomen kub]
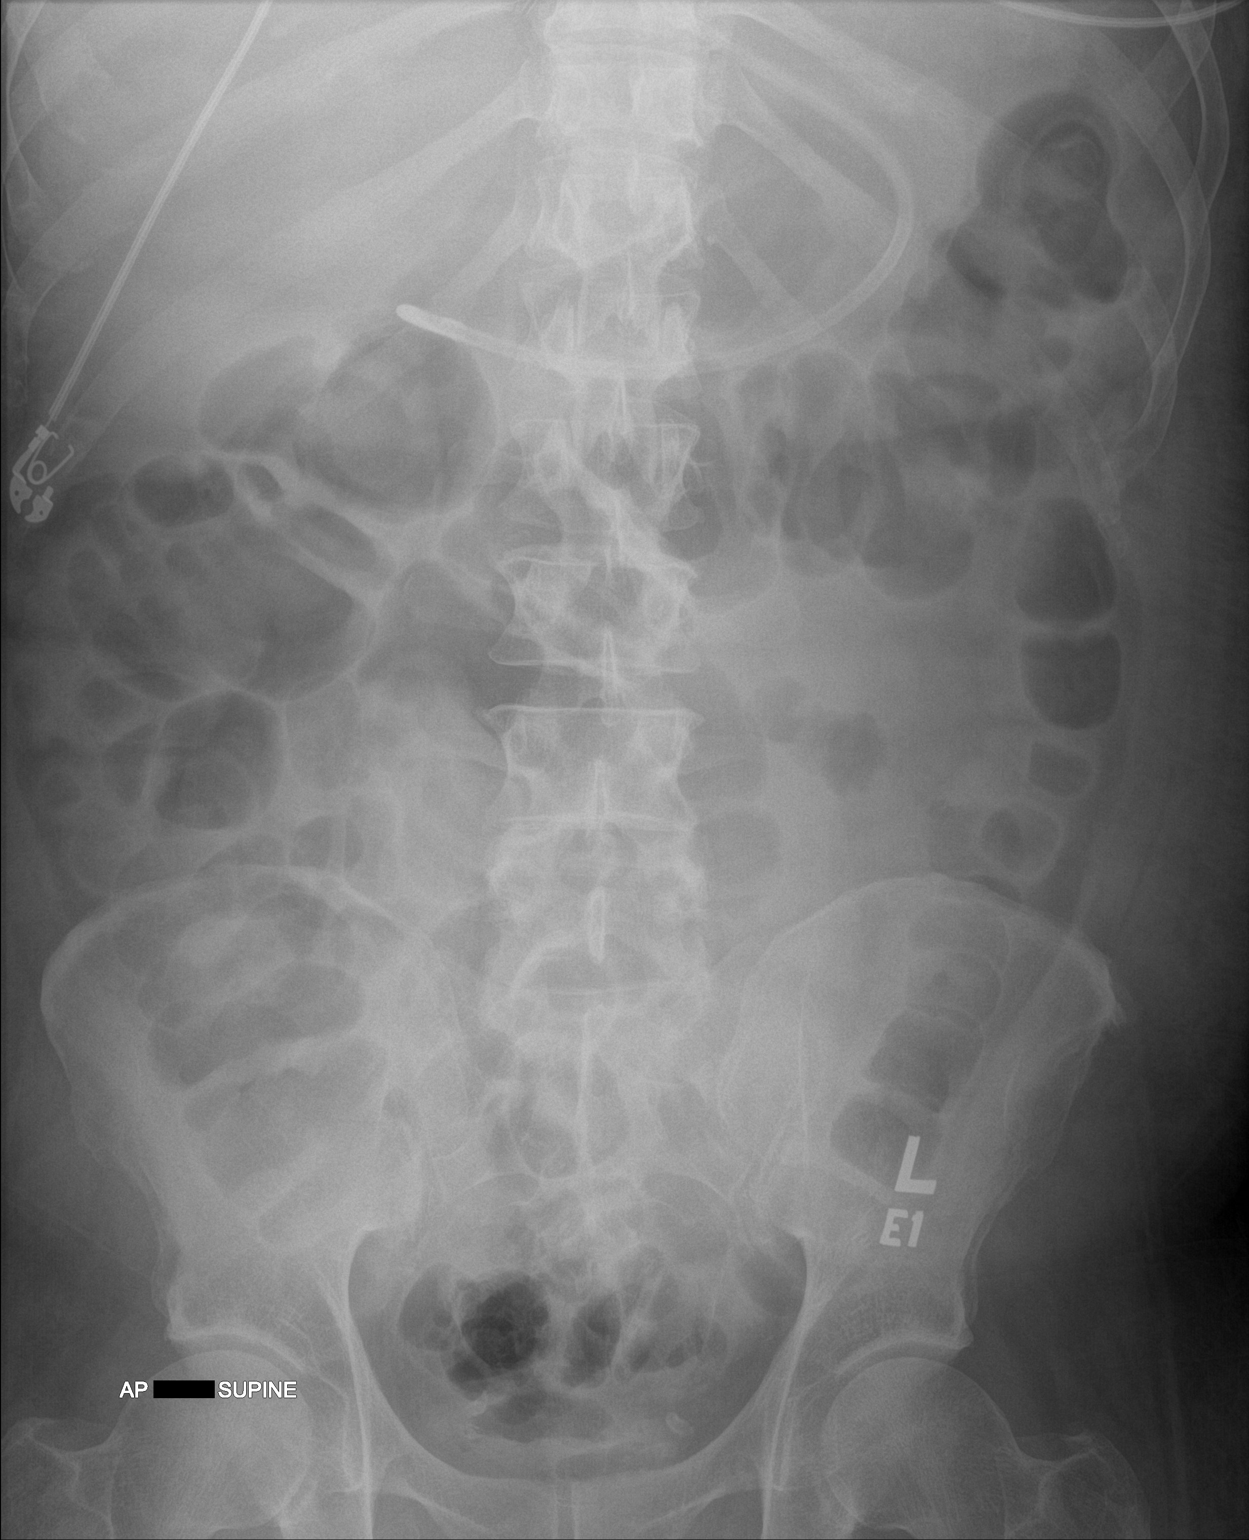

[1 of 1 positions shown; findings below may reference images not displayed]

FINDINGS: Enteric tube tip is in the region of the distal stomach. Bowel gas
pattern appears within normal limits. No suspicious calcifications.
IMPRESSION: No acute process identified.

## 2021-08-21 MED ORDER — PANTOPRAZOLE SODIUM 40 MG IV SOLR: 40.0000 mg | Freq: Two times a day (BID) | INTRAVENOUS | Status: DC

## 2021-08-21 MED ORDER — INSULIN GLARGINE-YFGN 100 UNIT/ML ~~LOC~~ SOLN
10.0000 [IU] | Freq: Two times a day (BID) | SUBCUTANEOUS | Status: DC
  Administered 2021-08-21 – 2021-08-22 (×4): 10 [IU] via SUBCUTANEOUS
  Filled 2021-08-21 (×6): qty 0.1

## 2021-08-21 MED ORDER — FLUCONAZOLE IN SODIUM CHLORIDE 400-0.9 MG/200ML-% IV SOLN
400.0000 mg | INTRAVENOUS | Status: AC
Start: 2021-08-21 — End: 2021-08-26
  Administered 2021-08-21 – 2021-08-26 (×6): 400 mg via INTRAVENOUS
  Filled 2021-08-21 (×6): qty 200

## 2021-08-21 MED ORDER — SODIUM CHLORIDE 0.9% IV SOLUTION: Freq: Once | INTRAVENOUS | Status: DC

## 2021-08-21 MED ORDER — SODIUM CHLORIDE 0.9% FLUSH: 10.0000 mL | INTRAVENOUS | Status: DC | PRN

## 2021-08-21 MED ORDER — HYDROMORPHONE HCL 1 MG/ML IJ SOLN
0.5000 mg | INTRAMUSCULAR | Status: DC | PRN
  Administered 2021-08-21: 0.5 mg via INTRAVENOUS
  Filled 2021-08-21: qty 0.5

## 2021-08-21 MED ORDER — DEXTROSE 5 % IV SOLN: INTRAVENOUS | Status: DC

## 2021-08-21 MED ORDER — PANTOPRAZOLE INFUSION (NEW) - SIMPLE MED
8.0000 mg/h | INTRAVENOUS | Status: AC
  Administered 2021-08-21 – 2021-08-23 (×7): 8 mg/h via INTRAVENOUS
  Filled 2021-08-21 (×3): qty 80
  Filled 2021-08-21: qty 100
  Filled 2021-08-21 (×4): qty 80

## 2021-08-21 MED ORDER — FREE WATER
300.0000 mL | Status: DC
  Administered 2021-08-21: 300 mL

## 2021-08-21 MED ORDER — SODIUM CHLORIDE 0.9% FLUSH
10.0000 mL | Freq: Two times a day (BID) | INTRAVENOUS | Status: DC
  Administered 2021-08-21 – 2021-08-24 (×7): 10 mL
  Administered 2021-08-24: 30 mL
  Administered 2021-08-25: 20 mL
  Administered 2021-08-26 – 2021-08-30 (×5): 10 mL

## 2021-08-21 MED ORDER — SODIUM CHLORIDE 0.9 % IV SOLN
20.0000 ug | Freq: Once | INTRAVENOUS | Status: AC
  Administered 2021-08-21: 20 ug via INTRAVENOUS
  Filled 2021-08-21: qty 5

## 2021-08-21 MED ORDER — CEFAZOLIN SODIUM-DEXTROSE 2-4 GM/100ML-% IV SOLN
2.0000 g | Freq: Three times a day (TID) | INTRAVENOUS | Status: DC
  Administered 2021-08-21 – 2021-08-30 (×27): 2 g via INTRAVENOUS
  Filled 2021-08-21 (×29): qty 100

## 2021-08-21 MED ORDER — COLLAGENASE 250 UNIT/GM EX OINT
TOPICAL_OINTMENT | Freq: Every day | CUTANEOUS | Status: DC
  Filled 2021-08-21: qty 30

## 2021-08-21 NOTE — Significant Event (Addendum)
Called by RN: pt had large, bloody appearing BM (melanotic).  Heparin already on hold due to GIB earlier in admit (sounds like PUD most likely) with "some reports of black stools before hemoglobin drop".  Hadnt been having further black stools until tonight.  1) hemoccult ordered stat and being sent 2) most recent HGB was 7.1 down from 7.4 (this before large bloody BM tonight) 3) will order 1u PRBC transfusion 4) H/H post transfusion 5) already on protonix Q12, will upgrade this to continuous infusion 6) likely needs GI re consult in AM (as described in Dr. Jacob Moores PN from yesterday).

## 2021-08-21 NOTE — Progress Notes (Signed)
Occupational Therapy Treatment Patient Details Name: Jose [name] MRN: [id number] DOB: Apr 27, 1976 Today's Date: 08/21/2021   History of present illness 45 y.o. male admitted on 08/06/2021 with worsening back pain, S/P T4-5 laminectomy, LT5 pediculotomy, and decompression of spinal cord on 10/14.  Pt with acute hypoxic respiratory failure in setting of recent GI bleed and recent spinal surgery. Endoscopy 10/17. Intubated 10/19-10/24. PMhx: T2DM, retinopathy, neuropathy, HTN, HLD, CKD, covid July 2022.   OT comments  Patient in bed upon entry, agreeable to OT/PT session. Pt completing bed mobility with max assist +2, sitting EOB engaged in ADLs and functional reaching for increasing activity tolerance with at best min assist for balance.  Pt able to utilize [facility] to assist in supporting self today, but requires constant cueing to maintain upright posture. Requires total assist for LB dressing.  He fatigues easily.  Pt arousal improved with activity, much clearer speech when upright at EOB today versus supine.  Requires +2 max assist to stand.  Will follow acutely, continue to recommend CIR.    Recommendations for follow up therapy are one component of a multi-disciplinary discharge planning process, led by the attending physician.  Recommendations may be updated based on patient status, additional functional criteria and insurance authorization.    Follow Up Recommendations  Acute inpatient rehab (3hours/day)    Assistance Recommended at Discharge Frequent or constant Supervision/Assistance  Equipment Recommendations  Other (comment) (defer to next venue)    Recommendations for Other Services Rehab consult    Precautions / Restrictions Precautions Precautions: Fall;Back Precaution Comments: cortrak Restrictions Weight Bearing Restrictions: No       Mobility Bed Mobility Overal bed mobility: Needs Assistance Bed Mobility: Rolling;Sidelying to Sit;Sit to Sidelying Rolling: Max  assist;+2 for physical assistance Sidelying to sit: Max assist;+2 for physical assistance     Sit to sidelying: Max assist;+2 for physical assistance General bed mobility comments: cues for technique based on precautions, truncal and LE assist both in/out of bed.    Transfers Overall transfer level: Needs assistance Equipment used:  (chair back) Transfers: Sit to/from Stand;Lateral/Scoot Transfers Sit to Stand: Max assist;+2 physical assistance          Lateral/Scoot Transfers: Max assist;+2 physical assistance;+2 safety/equipment (more stand/scoot transfers up toward [facility]) General transfer comment: cues for hand placement/direction.  Assist forward and with boost using padding as able.     Balance Overall balance assessment: Needs assistance Sitting-balance support: Feet supported;Single extremity supported;Bilateral upper extremity supported Sitting balance-Leahy Scale: Poor Sitting balance - Comments: worked on upright sitting in midline with UE assist, pt needing mod to max as he fatigued and became more painful.  completed forward reaching task with additional intention to keep pt in an upright posture.   Standing balance support: Bilateral upper extremity supported Standing balance-Leahy Scale: Poor Standing balance comment: pt unable to come fully upright without significant truncal/pelvic support and boost                           ADL either performed or assessed with clinical judgement   ADL Overall ADL's : Needs assistance/impaired     Grooming: Maximal assistance;Sitting               Lower Body Dressing: Total assistance;+2 for physical assistance;+2 for safety/equipment;Sit to/from Health and safety inspector Details (indicate cue type and reason): deferred         Functional mobility during ADLs: +2 for physical assistance;+2 for safety/equipment;Rolling  walker (2 wheels);Maximal assistance       Vision       Perception     Praxis       Cognition Arousal/Alertness: Lethargic;Awake/alert Behavior During Therapy: Flat affect Overall Cognitive Status: Impaired/Different from baseline                   Orientation Level: Disoriented to;Time Current Attention Level: Sustained Memory: Decreased short-term memory;Decreased recall of precautions Following Commands: Follows one step commands inconsistently;Follows one step commands with increased time Safety/Judgement: Decreased awareness of safety;Decreased awareness of deficits Awareness: Emergent Problem Solving: Slow processing;Decreased initiation;Difficulty sequencing;Requires verbal cues;Requires tactile cues General Comments: pt following simple commands today, decreased problem solving and awareness. remains mildly confused.          Exercises Exercises: Other exercises Other Exercises Other Exercises: B hand pumps x 10 reps   Shoulder Instructions       General Comments VSS, cueing for PLB throughout session on 4L [place]    Pertinent Vitals/ Pain       Pain Assessment: Faces Faces Pain Scale: Hurts even more Pain Location: incision site between shoulder blades Pain Descriptors / Indicators: Guarding;Grimacing Pain Intervention(s): Monitored during session;Repositioned;Limited activity within patient's tolerance  Home Living                                          Prior Functioning/Environment              Frequency  Min 2X/week        Progress Toward Goals  OT Goals(current goals can now be found in the care plan section)  Progress towards OT goals: Progressing toward goals  Acute Rehab OT Goals OT Goal Formulation: With patient Time For Goal Achievement: 08/26/21  Plan Discharge plan remains appropriate;Frequency remains appropriate    Co-evaluation    PT/OT/SLP Co-Evaluation/Treatment: Yes Reason for Co-Treatment: Complexity of the patient's impairments (multi-system involvement) PT goals addressed  during session: Mobility/safety with mobility OT goals addressed during session: ADL's and self-care      AM-PAC OT "6 Clicks" Daily Activity     Outcome Measure   Help from another person eating meals?: A Lot Help from another person taking care of personal grooming?: A Lot Help from another person toileting, which includes using toliet, bedpan, or urinal?: Total Help from another person bathing (including washing, rinsing, drying)?: A Lot Help from another person to put on and taking off regular upper body clothing?: A Lot Help from another person to put on and taking off regular lower body clothing?: Total 6 Click Score: 10    End of Session Equipment Utilized During Treatment: Oxygen (4L)  OT Visit Diagnosis: Other abnormalities of gait and mobility (R26.89);Muscle weakness (generalized) (M62.81);Pain Pain - part of body:  (back)   Activity Tolerance Patient tolerated treatment well   Patient Left in bed;with call bell/phone within reach;with bed alarm set;with nursing/sitter in room   Nurse Communication Mobility status        Time: 8832-5498 OT Time Calculation (min): 23 min  Charges: OT General Charges $OT Visit: 1 Visit OT Treatments $Self Care/Home Management : 8-22 mins  Jolaine Artist, OT Acute Rehabilitation Services Pager [phone] Office [place] 08/21/2021, 2:10 PM

## 2021-08-21 NOTE — Progress Notes (Signed)
Upper extremity venous has been completed.   Preliminary results in CV Proc.   Jose [name] 08/21/2021 10:51 AM

## 2021-08-21 NOTE — Progress Notes (Addendum)
PROGRESS NOTE    Jose [name]  MWU:[id number] DOB: 02-28-1976 DOA: 08/06/2021 PCP: [name], NP   Brief Narrative:  Jose [name] is a 45 y.o. with a pertinent PMH of GAD, HTN, HLD, macular degeneration, T2DM, CKD (Stage 3), tobacco use disorder, hx of pleural effusion, recent admission for MSSA bacteremia/osteomyelitis who was discharged home with IV antibiotics on 08/03/2021, returned to the ED on 08/07/2021 with worsening back pain and admitted under [facility], now s/p T4-T5 laminectomy, left T5 pediculotomy, and spinal cord decompression. On 10/17 patient developed coffee-ground emesis, s/p EGD showing circumferential esophagitis with hemorrhage and non-bleeding duodenal ulcers.On 10/17, pccm was consulted for acute hypoxia.    Significant Events: 10/13 -admitted with worsening thoracic osteomyelitis, constipation/abd distention, neurosurgery consulted  10/14 - T4-T5 laminectomy, left T5 pediculotomy, and spinal cord decompression 10/17 - Hgb drop to 6.2, coffee ground emesis s/p EGD with esophagitis and peptic ulcer 10/17 - hypoxia and pccm consult  10/19 intubated 10/21 developed worsening kidney function, nephro consulted.  10/24 extubated Transferred under [facility] on 08/18/2021.  Assessment & Plan:   Principal Problem:   Osteomyelitis of thoracic spine (HCC) Active Problems:   Type 2 diabetes mellitus with diabetic polyneuropathy, without long-term current use of insulin (HCC)   Hyperlipidemia   GAD (generalized anxiety disorder)   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)   Essential hypertension   CKD (chronic kidney disease) stage 3, GFR 30-59 ml/min (HCC)   Pleural effusion, bilateral  Acute hypoxic respiratory failure/aspiration pneumonia, pneumonitis: Patient required intubation on 08/12/2021, extubated on 08/17/2021, oxygenation improved, currently on 4 L and maintaining oxygen saturation at 93%.  He recently completed course of Unasyn.  Chest x-ray 08/19/2019 shows  persistent left lung pneumonia and worsening of atelectasis and consolidation at lung bases.  Procalcitonin improved though.  Antibiotics were switched to Unasyn but I am now less concerned with aspiration pneumonia and per ID, cefazolin will be better choice for his MSSA bacteremia so we will transition him back to cefazolin.  Acute blood loss anemia/upper GI bleed/candidal esophagitis and PUD s/p EGD/possible PE: Patient had acute drop in hemoglobin from 8.1-5.9 within 24 hours on the morning of 08/19/2021, received 2 units of PRBC transfusion.  Hemoglobin improved to 7.7 but then dropped to 7.1 again early morning and he had a large melanotic stool so 1 unit of PRBC transfusion was ordered by night hospitalist.  He might need another EGD.  I consulted GI again.  They have stopped his tube feedings and they are preparing him for possible EGD tomorrow morning.  We will transition to Protonix drip from twice daily per GI recommendations.  Also his VQ scan cannot rule out PE on the left side, intermediate probability.  Doppler lower extremity and left upper extremity negative for any DVT.  PE cannot be ruled out completely but unfortunately due to recurrent acute blood loss anemia, we cannot initiate him on any anticoagulation.  Hopefully once his renal function improves, we will do CT angiogram of chest for definitive study but with negative DVT, chances of PE are much less now.   ATN on top of acute on chronic kidney disease stage IIIa: baseline creatinine of 1.5-2.0.  Creatinine peaked at 4.5 on 08/17/2021, now improving, down to 2.26.  Today.  Good diuresis.  Nephrology on board and management per them.  Avoid nephrotoxic agents.   Hyperkalemia: Resolved.  Hyponatremia: Sodium rising, nephrology on board and managing.  Has a started on dextrose.   Diabetes mellitus type  2 with hyperglycemia: Blood sugar improving..  Currently on Semglee 10 units, NovoLog 8 units every 4 hours and SSI.  We will continue  this. Core track and placed since 08/12/2021.   Shock liver: Continue conservative management LFTs are improving.   Sinus tachycardia: Had a transesophageal echo on 04/28/2021 that showed an EF of 60% no wall motion abnormality.  Before admission was on verapamil.  Continue Lopressor 25 mg p.o. twice daily.   Diabetic ulcer of left great toe ([place]): Noted.   Essential hypertension blood pressure was elevated earlier but now low, very labile, continue current dose of Lopressor and amlodipine.   Pleural effusion, bilateral Due to third spacing, he was giving albumin with good diuresis.   Sacral decubitus ulcer stage II present on admission: Wound care on board.  DVT prophylaxis: SCDs Start: 08/06/21 1536   Code Status: Full Code  Family Communication: None at bedside.  Will discuss with the wife later.  Status is: Inpatient  Remains inpatient appropriate because: Still very sick  Estimated body mass index is 30.07 kg/m as calculated from the following:   Height as of this encounter: 5\' 11"  (1.803 m).   Weight as of this encounter: 97.8 kg.  Pressure Injury 08/10/21 Coccyx Medial Stage 2 -  Partial thickness loss of dermis presenting as a shallow open injury with a red, pink wound bed without slough. (Active)  08/10/21 1700  Location: Coccyx (gluteal crease)  Location Orientation: Medial  Staging: Stage 2 -  Partial thickness loss of dermis presenting as a shallow open injury with a red, pink wound bed without slough.  Wound Description (Comments):   Present on Admission: Yes     Pressure Injury 08/14/21 Buttocks Bilateral Deep Tissue Pressure Injury - Purple or maroon localized area of discolored intact skin or blood-filled blister due to damage of underlying soft tissue from pressure and/or shear. DTI (Active)  08/14/21 0800  Location: Buttocks  Location Orientation: Bilateral  Staging: Deep Tissue Pressure Injury - Purple or maroon localized area of discolored intact skin or  blood-filled blister due to damage of underlying soft tissue from pressure and/or shear.  Wound Description (Comments): DTI  Present on Admission:     Nutritional Assessment: Body mass index is 30.07 kg/m.Marland Kitchen Seen by dietician.  I agree with the assessment and plan as outlined below: Nutrition Status: Nutrition Problem: Increased nutrient needs Etiology: post-op healing Signs/Symptoms: estimated needs Interventions: Ensure Enlive (each supplement provides 350kcal and 20 grams of protein), Tube feeding, Refer to RD note for recommendations  .  Skin Assessment: I have examined the patient's skin and I agree with the wound assessment as performed by the wound care RN as outlined below: Pressure Injury 08/10/21 Coccyx Medial Stage 2 -  Partial thickness loss of dermis presenting as a shallow open injury with a red, pink wound bed without slough. (Active)  08/10/21 1700  Location: Coccyx (gluteal crease)  Location Orientation: Medial  Staging: Stage 2 -  Partial thickness loss of dermis presenting as a shallow open injury with a red, pink wound bed without slough.  Wound Description (Comments):   Present on Admission: Yes     Pressure Injury 08/14/21 Buttocks Bilateral Deep Tissue Pressure Injury - Purple or maroon localized area of discolored intact skin or blood-filled blister due to damage of underlying soft tissue from pressure and/or shear. DTI (Active)  08/14/21 0800  Location: Buttocks  Location Orientation: Bilateral  Staging: Deep Tissue Pressure Injury - Purple or maroon localized area of discolored  intact skin or blood-filled blister due to damage of underlying soft tissue from pressure and/or shear.  Wound Description (Comments): DTI  Present on Admission:     Consultants:  PCCM and ID  Procedures:  As above  Antimicrobials:  Anti-infectives (From admission, onward)    Start     Dose/Rate Route Frequency Ordered Stop   08/21/21 1300  ceFAZolin (ANCEF) IVPB 2g/100 mL  premix        2 g 200 mL/hr over 30 Minutes Intravenous Every 8 hours 08/21/21 0918     08/21/21 1000  fluconazole (DIFLUCAN) tablet 400 mg  Status:  Discontinued        400 mg Per Tube Daily 08/20/21 1104 08/21/21 0925   08/21/21 1000  fluconazole (DIFLUCAN) IVPB 400 mg        400 mg 100 mL/hr over 120 Minutes Intravenous Every 24 hours 08/21/21 0925 08/27/21 0959   08/20/21 1200  fluconazole (DIFLUCAN) tablet 200 mg        200 mg Per Tube  Once 08/20/21 1104 08/20/21 1207   08/20/21 1000  fluconazole (DIFLUCAN) tablet 200 mg  Status:  Discontinued        200 mg Per Tube Daily 08/20/21 0825 08/20/21 1104   08/20/21 0047  Ampicillin-Sulbactam (UNASYN) 3 g in sodium chloride 0.9 % 100 mL IVPB  Status:  Discontinued        3 g 200 mL/hr over 30 Minutes Intravenous Every 12 hours 08/19/21 1619 08/19/21 1621   08/19/21 2100  Ampicillin-Sulbactam (UNASYN) 3 g in sodium chloride 0.9 % 100 mL IVPB  Status:  Discontinued        3 g 200 mL/hr over 30 Minutes Intravenous Every 8 hours 08/19/21 1621 08/21/21 0918   08/19/21 1200  Ampicillin-Sulbactam (UNASYN) 3 g in sodium chloride 0.9 % 100 mL IVPB  Status:  Discontinued        3 g 200 mL/hr over 30 Minutes Intravenous Every 8 hours 08/19/21 1055 08/19/21 1619   08/19/21 1015  fluconazole (DIFLUCAN) IVPB 200 mg  Status:  Discontinued        200 mg 100 mL/hr over 60 Minutes Intravenous Every 24 hours 08/19/21 0916 08/20/21 0825   08/17/21 2300  ceFAZolin (ANCEF) IVPB 2g/100 mL premix  Status:  Discontinued        2 g 200 mL/hr over 30 Minutes Intravenous Every 12 hours 08/17/21 1054 08/19/21 1055   08/16/21 1000  fluconazole (DIFLUCAN) tablet 200 mg  Status:  Discontinued        200 mg Per Tube Daily 08/15/21 1414 08/19/21 0916   08/15/21 2314  Ampicillin-Sulbactam (UNASYN) 3 g in sodium chloride 0.9 % 100 mL IVPB  Status:  Discontinued        3 g 200 mL/hr over 30 Minutes Intravenous Every 12 hours 08/15/21 1410 08/17/21 1054   08/13/21 1000   fluconazole (DIFLUCAN) tablet 400 mg  Status:  Discontinued        400 mg Per Tube Daily 08/12/21 1148 08/15/21 1414   08/12/21 1000  fluconazole (DIFLUCAN) tablet 400 mg  Status:  Discontinued        400 mg Oral Daily 08/12/21 0913 08/12/21 1148   08/11/21 1800  vancomycin (VANCOREADY) IVPB 1250 mg/250 mL  Status:  Discontinued        1,250 mg 166.7 mL/hr over 90 Minutes Intravenous Every 24 hours 08/10/21 1651 08/11/21 0939   08/11/21 1030  ceFEPIme (MAXIPIME) 2 g in sodium chloride 0.9 % 100 mL  IVPB  Status:  Discontinued        2 g 200 mL/hr over 30 Minutes Intravenous Every 12 hours 08/11/21 0936 08/11/21 0939   08/11/21 1030  Ampicillin-Sulbactam (UNASYN) 3 g in sodium chloride 0.9 % 100 mL IVPB  Status:  Discontinued        3 g 200 mL/hr over 30 Minutes Intravenous Every 6 hours 08/11/21 0940 08/15/21 1410   08/10/21 1730  vancomycin (VANCOREADY) IVPB 1750 mg/350 mL        1,750 mg 175 mL/hr over 120 Minutes Intravenous  Once 08/10/21 1651 08/10/21 1956   08/10/21 1715  piperacillin-tazobactam (ZOSYN) IVPB 3.375 g  Status:  Discontinued        3.375 g 12.5 mL/hr over 240 Minutes Intravenous Every 8 hours 08/10/21 1651 08/11/21 0936   08/06/21 2200  ceFAZolin (ANCEF) IVPB 2g/100 mL premix  Status:  Discontinued        2 g 200 mL/hr over 30 Minutes Intravenous Every 8 hours 08/06/21 1829 08/10/21 1631   08/06/21 1415  vancomycin (VANCOREADY) IVPB 1750 mg/350 mL  Status:  Discontinued        1,750 mg 175 mL/hr over 120 Minutes Intravenous Every 24 hours 08/06/21 1400 08/06/21 1829   08/06/21 1415  ceFEPIme (MAXIPIME) 2 g in sodium chloride 0.9 % 100 mL IVPB  Status:  Discontinued        2 g 200 mL/hr over 30 Minutes Intravenous Every 8 hours 08/06/21 1400 08/06/21 1829          Subjective:  Patient seen and examined, he is looking calmer compared to yesterday, denied any shortness of breath.  He had mild tachycardia and tachypnea, much improved compared to yesterday.  Had some  pain in the back. Objective: Vitals:   08/21/21 1109 08/21/21 1130 08/21/21 1139 08/21/21 1218  BP:  119/61    Pulse: 95  90   Resp: 17  (!) 25   Temp:    98.9 F (37.2 C)  TempSrc:    Oral  SpO2: 94%  97%   Weight:      Height:        Intake/Output Summary (Last 24 hours) at 08/21/2021 1343 Last data filed at 08/21/2021 1300 Gross per 24 hour  Intake 2436.26 ml  Output 2225 ml  Net 211.26 ml    Filed Weights   08/19/21 0400 08/20/21 0442 08/21/21 0500  Weight: 92 kg 91.9 kg 97.8 kg    Examination:  General exam: Appears anxious and slightly tachypneic. Respiratory system: Diminished breath sounds with shallow breathing. Cardiovascular system: S1 & S2 heard, sinus tachycardia no JVD, murmurs, rubs, gallops or clicks. No pedal edema. Gastrointestinal system: Abdomen is nondistended, soft and nontender. No organomegaly or masses felt. Normal bowel sounds heard. Central nervous system: Alert and oriented. No focal neurological deficits. Extremities: Symmetric 5 x 5 power.    Data Reviewed: I have personally reviewed following labs and imaging studies  CBC: Recent Labs  Lab 08/17/21 0400 08/18/21 0334 08/19/21 0430 08/19/21 0520 08/20/21 0327 08/20/21 1321 08/20/21 2318 08/21/21 0515 08/21/21 0625  WBC 21.4* 21.7* 20.1*  --  21.1*  --   --  23.7*  --   NEUTROABS  --   --   --   --  16.1*  --   --  18.9*  --   HGB 8.3* 8.1* 5.9*   < > 7.7* 7.4* 7.1* 8.0* 7.8*  HCT 26.5* 26.1* 19.1*   < > 23.4* 23.1* 22.1* 24.4*  23.0*  MCV 90.8 92.6 92.7  --  86.3  --   --  88.1  --   PLT 352 360 298  --  338  --   --  337  --    < > = values in this interval not displayed.    Basic Metabolic Panel: Recent Labs  Lab 08/14/21 1700 08/15/21 0545 08/16/21 0454 08/19/21 0430 08/19/21 1520 08/19/21 2155 08/20/21 0327 08/21/21 0515 08/21/21 0625  NA 137 140   < > 146* 150* 149* 148* 152* 156*  K 4.8 4.6   < > 6.1* 5.0 4.5 4.6 4.1 4.2  CL 105 107   < > 116* 117* 116*  118* 120*  --   CO2 21* 22   < > 23 21* 21* 22 23  --   GLUCOSE 365* 225*   < > 281* 139* 195* 258* 179*  --   BUN 85* 97*   < > 130* 123* 119* 114* 96*  --   CREATININE 3.78* 4.23*   < > 3.53* 3.27* 3.00* 2.94* 2.26*  --   CALCIUM 8.2* 8.3*   < > 8.8* 9.0 9.1 9.1 9.0  --   MG 2.6* 2.6*  --   --   --   --   --   --   --   PHOS 3.7 3.2  --   --   --   --  4.3 4.6  --    < > = values in this interval not displayed.    GFR: Estimated Creatinine Clearance: 48.2 mL/min (A) (by C-G formula based on SCr of 2.26 mg/dL (H)). Liver Function Tests: Recent Labs  Lab 08/15/21 0545 08/16/21 0454 08/17/21 0400 08/18/21 0334 08/19/21 0430 08/20/21 0327 08/21/21 0515  AST 580* 253* 155* 74* 107*  --   --   ALT 211* 157* 114* 77* 46*  --   --   ALKPHOS 804* 836* 715* 636* 596*  --   --   BILITOT 1.7* 2.3* 2.8* 3.0* 2.4*  --   --   PROT 5.7* 5.5* 5.9* 6.3* 6.1*  --   --   ALBUMIN <1.5* <1.5* <1.5* 2.1* 2.3* 2.2* 2.1*    No results for input(s): LIPASE, AMYLASE in the last 168 hours. No results for input(s): AMMONIA in the last 168 hours. Coagulation Profile: No results for input(s): INR, PROTIME in the last 168 hours. Cardiac Enzymes: Recent Labs  Lab 08/14/21 1700  CKTOTAL 25*    BNP (last 3 results) No results for input(s): PROBNP in the last 8760 hours. HbA1C: No results for input(s): HGBA1C in the last 72 hours. CBG: Recent Labs  Lab 08/20/21 2012 08/20/21 2346 08/21/21 0433 08/21/21 0747 08/21/21 1215  GLUCAP 125* 190* 168* 143* 175*    Lipid Profile: No results for input(s): CHOL, HDL, LDLCALC, TRIG, CHOLHDL, LDLDIRECT in the last 72 hours.  Thyroid Function Tests: No results for input(s): TSH, T4TOTAL, FREET4, T3FREE, THYROIDAB in the last 72 hours. Anemia Panel: No results for input(s): VITAMINB12, FOLATE, FERRITIN, TIBC, IRON, RETICCTPCT in the last 72 hours. Sepsis Labs: Recent Labs  Lab 08/19/21 1412  PROCALCITON 0.51     Recent Results (from the past  240 hour(s))  Culture, blood (routine x 2)     Status: None   Collection Time: 08/12/21  8:19 AM   Specimen: BLOOD  Result Value Ref Range Status   Specimen Description BLOOD LEFT ANTECUBITAL  Final   Special Requests   Final  BOTTLES DRAWN AEROBIC AND ANAEROBIC Blood Culture adequate volume   Culture   Final    NO GROWTH 5 DAYS Performed at [facility], [place] [address] [place], [place] [address]    Report Status 08/17/2021 FINAL  Final  Culture, blood (routine x 2)     Status: None   Collection Time: 08/12/21  8:20 AM   Specimen: BLOOD LEFT HAND  Result Value Ref Range Status   Specimen Description BLOOD LEFT HAND  Final   Special Requests   Final    BOTTLES DRAWN AEROBIC ONLY Blood Culture results may not be optimal due to an inadequate volume of blood received in culture bottles   Culture   Final    NO GROWTH 5 DAYS Performed at [facility], [place] [address], [place], [place] [address]    Report Status 08/17/2021 FINAL  Final  Culture, Respiratory w Gram Stain     Status: None   Collection Time: 08/13/21  4:30 PM   Specimen: Tracheal Aspirate; Respiratory  Result Value Ref Range Status   Specimen Description TRACHEAL ASPIRATE  Final   Special Requests Normal  Final   Gram Stain   Final    NO SQUAMOUS EPITHELIAL CELLS SEEN FEW WBC SEEN NO ORGANISMS SEEN    Culture   Final    NO GROWTH 2 DAYS Performed at [facility], [address] [place], [place] [address]    Report Status 08/15/2021 FINAL  Final  Body fluid culture w Gram Stain     Status: None   Collection Time: 08/14/21  8:15 AM   Specimen: Pleural Fluid  Result Value Ref Range Status   Specimen Description FLUID  Final   Special Requests  PLEURAL FLUID  Final   Gram Stain   Final    MODERATE WBC PRESENT, PREDOMINANTLY MONONUCLEAR NO ORGANISMS SEEN    Culture   Final    NO GROWTH 3 DAYS Performed at [facility], [place] [address], [place], [place] [address]    Report Status  08/17/2021 FINAL  Final       Radiology Studies: DG Abd 1 View  Result Date: 08/21/2021 CLINICAL DATA:  Back pain, free air EXAM: ABDOMEN - 1 VIEW COMPARISON:  Abdominal x-ray 08/14/2021 FINDINGS: Enteric tube tip is in the region of the distal stomach. Bowel gas pattern appears within normal limits. No suspicious calcifications. IMPRESSION: No acute process identified. Electronically Signed   By: Ofilia Neas M.D.   On: 08/21/2021 10:40   NM Pulmonary Perfusion  Result Date: 08/20/2021 CLINICAL DATA:  Recent COVID-19 infection.  Short of breath EXAM: NUCLEAR MEDICINE PERFUSION LUNG SCAN TECHNIQUE: Perfusion images were obtained in multiple projections after intravenous injection of radiopharmaceutical. RADIOPHARMACEUTICALS:  Chest radiograph 08/20/2021 mCi Tc-3m MAA COMPARISON:  None. FINDINGS: Decreased regional perfusion to the LEFT upper lobe which has a more central photopenia than a peripheral photopenia. Pattern not typical of pulmonary embolism. No perfusion defects in the RIGHT lung. Comparison chest radiograph demonstrates diffuse bilateral airspace disease. IMPRESSION: Large perfusion abnormality in the LEFT upper lobe which has a more regional distribution than segmental. Pattern not typical of pulmonary embolism however cannot exclude pulmonary embolism. Differential would include pulmonary embolism versus pneumonia. Intermediate probability. Electronically Signed   By: Suzy Bouchard M.D.   On: 08/20/2021 16:20   DG CHEST PORT 1 VIEW  Result Date: 08/21/2021 CLINICAL DATA:  Back pain, shortness of breath EXAM: PORTABLE CHEST 1 VIEW COMPARISON:  Chest  x-ray 08/20/2021 FINDINGS: Right PICC tip in the SVC. Enteric tube tip below the diaphragm. Cardiomediastinal silhouette appears unchanged. Diffuse prominent interstitial and airspace opacities bilaterally left worse than right with improved aeration of the right lung since previous study. Possible trace left pleural effusion. No  pneumothorax visualized. IMPRESSION: 1. Mildly improved aeration of the right lung since previous study. 2. Otherwise no significant change. Electronically Signed   By: Ofilia Neas M.D.   On: 08/21/2021 10:41   DG CHEST PORT 1 VIEW  Result Date: 08/20/2021 CLINICAL DATA:  Nausea and vomiting. EXAM: PORTABLE CHEST 1 VIEW COMPARISON:  Chest x-ray dated August 18, 2021. FINDINGS: Unchanged feeding tube and right upper extremity PICC line. Stable cardiomediastinal silhouette. Similar perihilar opacities throughout the left lung. Unchanged mild right basilar opacity. No pleural effusion or pneumothorax. No acute osseous abnormality. IMPRESSION: 1. Similar left lung pneumonia. 2. Unchanged mild right basilar atelectasis versus infiltrate. Electronically Signed   By: Titus Dubin M.D.   On: 08/20/2021 14:37   VAS [place] LOWER EXTREMITY VENOUS (DVT)  Result Date: 08/21/2021  Lower Venous DVT Study Patient Name:  KHALFANI WEIDEMAN  Date of Exam:   08/21/2021 Medical Rec #: [id number]        Accession #:    [id number] Date of Birth: 12/18/1975        Patient Gender: M Patient Age:   79 years Exam Location:  [facility] Procedure:      VAS [place] LOWER EXTREMITY VENOUS (DVT) Referring Phys: Ainhoa Rallo --------------------------------------------------------------------------------  Indications: Swelling, and Edema.  Comparison Study: no prior Performing Technologist: Archie Patten RVS  Examination Guidelines: A complete evaluation includes B-mode imaging, spectral Doppler, color Doppler, and power Doppler as needed of all accessible portions of each vessel. Bilateral testing is considered an integral part of a complete examination. Limited examinations for reoccurring indications may be performed as noted. The reflux portion of the exam is performed with the patient in reverse Trendelenburg.  +---------+---------------+---------+-----------+----------+--------------+ RIGHT     CompressibilityPhasicitySpontaneityPropertiesThrombus Aging +---------+---------------+---------+-----------+----------+--------------+ CFV      Full           Yes      Yes                                 +---------+---------------+---------+-----------+----------+--------------+ SFJ      Full                                                        +---------+---------------+---------+-----------+----------+--------------+ FV Prox  Full                                                        +---------+---------------+---------+-----------+----------+--------------+ FV Mid   Full                                                        +---------+---------------+---------+-----------+----------+--------------+ FV DistalFull                                                        +---------+---------------+---------+-----------+----------+--------------+  PFV      Full                                                        +---------+---------------+---------+-----------+----------+--------------+ POP      Full           Yes      Yes                                 +---------+---------------+---------+-----------+----------+--------------+ PTV      Full                                                        +---------+---------------+---------+-----------+----------+--------------+ PERO     Full                                                        +---------+---------------+---------+-----------+----------+--------------+   +---------+---------------+---------+-----------+----------+--------------+ LEFT     CompressibilityPhasicitySpontaneityPropertiesThrombus Aging +---------+---------------+---------+-----------+----------+--------------+ CFV      Full           Yes      Yes                                 +---------+---------------+---------+-----------+----------+--------------+ SFJ      Full                                                         +---------+---------------+---------+-----------+----------+--------------+ FV Prox  Full                                                        +---------+---------------+---------+-----------+----------+--------------+ FV Mid   Full                                                        +---------+---------------+---------+-----------+----------+--------------+ FV DistalFull                                                        +---------+---------------+---------+-----------+----------+--------------+ PFV      Full                                                        +---------+---------------+---------+-----------+----------+--------------+  POP      Full           Yes      Yes                                 +---------+---------------+---------+-----------+----------+--------------+ PTV      Full                                                        +---------+---------------+---------+-----------+----------+--------------+ PERO     Full                                                        +---------+---------------+---------+-----------+----------+--------------+     Summary: BILATERAL: - No evidence of deep vein thrombosis seen in the lower extremities, bilaterally. -No evidence of popliteal cyst, bilaterally.   *See table(s) above for measurements and observations.    Preliminary    VAS [place] UPPER EXTREMITY VENOUS DUPLEX  Result Date: 08/21/2021 UPPER VENOUS STUDY  Patient Name:  RODY KEADLE  Date of Exam:   08/21/2021 Medical Rec #: [id number]        Accession #:    [id number] Date of Birth: 07/09/1976        Patient Gender: M Patient Age:   81 years Exam Location:  [facility] Procedure:      VAS [place] UPPER EXTREMITY VENOUS DUPLEX Referring Phys: Shauntel Prest --------------------------------------------------------------------------------  Indications: Edema Comparison Study: no prior Performing Technologist: Archie Patten  RVS  Examination Guidelines: A complete evaluation includes B-mode imaging, spectral Doppler, color Doppler, and power Doppler as needed of all accessible portions of each vessel. Bilateral testing is considered an integral part of a complete examination. Limited examinations for reoccurring indications may be performed as noted.  Right Findings: +----------+------------+---------+-----------+----------+-------+ RIGHT     CompressiblePhasicitySpontaneousPropertiesSummary +----------+------------+---------+-----------+----------+-------+ Subclavian    Full       Yes       Yes                      +----------+------------+---------+-----------+----------+-------+  Left Findings: +----------+------------+---------+-----------+----------+-------+ LEFT      CompressiblePhasicitySpontaneousPropertiesSummary +----------+------------+---------+-----------+----------+-------+ IJV           Full       Yes       Yes                      +----------+------------+---------+-----------+----------+-------+ Subclavian    Full       Yes       Yes                      +----------+------------+---------+-----------+----------+-------+ Axillary      Full       Yes       Yes                      +----------+------------+---------+-----------+----------+-------+ Brachial      Full       Yes       Yes                      +----------+------------+---------+-----------+----------+-------+  Radial        Full                                          +----------+------------+---------+-----------+----------+-------+ Ulnar         Full                                          +----------+------------+---------+-----------+----------+-------+ Cephalic      Full                                          +----------+------------+---------+-----------+----------+-------+ Basilic       Full                                           +----------+------------+---------+-----------+----------+-------+  Summary:  Right: No evidence of thrombosis in the subclavian.  Left: No evidence of deep vein thrombosis in the upper extremity. No evidence of superficial vein thrombosis in the upper extremity.  *See table(s) above for measurements and observations.    Preliminary     Scheduled Meds:  sodium chloride   Intravenous Once   sodium chloride   Intravenous Once   amLODipine  5 mg Per NG tube Daily   Chlorhexidine Gluconate Cloth  6 each Topical Daily   collagenase   Topical Daily   darbepoetin (ARANESP) injection - NON-DIALYSIS  150 mcg Subcutaneous Q Mon-1800   feeding supplement  237 mL Oral TID BM   feeding supplement (PROSource TF)  45 mL Per Tube BID   gabapentin  200 mg Per Tube Q12H   insulin aspart  0-20 Units Subcutaneous Q4H   insulin aspart  8 Units Subcutaneous Q4H   insulin glargine-yfgn  10 Units Subcutaneous BID   lidocaine  2 patch Transdermal Q24H   living well with diabetes book   Does not apply Once   LORazepam  0.5 mg Intravenous Q6H   mouth rinse  15 mL Mouth Rinse BID   melatonin  3 mg Oral QHS   metoprolol tartrate  25 mg Per NG tube BID   mupirocin ointment  1 application Topical BID   [START ON 08/24/2021] pantoprazole  40 mg Intravenous Q12H   sodium chloride flush  10-40 mL Intracatheter Q12H   sodium chloride flush  3 mL Intravenous Q12H   sucralfate  1 g Per Tube TID WC & HS   traZODone  100 mg Per Tube QHS   venlafaxine  37.5 mg Per Tube Daily   Continuous Infusions:  sodium chloride 10 mL/hr at 08/21/21 0700    ceFAZolin (ANCEF) IV     dextrose     feeding supplement (VITAL 1.5 CAL) Stopped (08/21/21 0837)   fluconazole (DIFLUCAN) IV 400 mg (08/21/21 1105)   pantoprazole 8 mg/hr (08/21/21 0700)   promethazine (PHENERGAN) injection (IM or IVPB) 25 mg (08/10/21 0506)     LOS: 15 days   Time spent: 35 minutes   Darliss Cheney, MD [facility]  08/21/2021, 1:43 PM  Please  page via [place] and do not message via secure chat for anything urgent. Secure chat  can be used for anything non urgent.  How to contact the [facility] Attending or Consulting provider [place] or covering provider during after hours [place], for this patient?  Check the care team in [facility] and look for a) attending/consulting TRH provider listed and b) the [facility] team listed. Page or secure chat [phone]. Log into [email or web address] and use [place]'s universal password to access. If you do not have the password, please contact the hospital operator. Locate the [facility] provider you are looking for under [facility] and page to a number that you can be directly reached. If you still have difficulty reaching the provider, please page the [facility] (Director on Call) for the Hospitalists listed on amion for assistance.

## 2021-08-21 NOTE — Progress Notes (Signed)
Physical Therapy Treatment Patient Details Name: Jose [name] MRN: [id number] DOB: Jul 17, 1976 Today's Date: 08/21/2021   History of Present Illness 45 y.o. male admitted on 08/06/2021 with worsening back pain, S/P T4-5 laminectomy, LT5 pediculotomy, and decompression of spinal cord on 10/14.  Pt with acute hypoxic respiratory failure in setting of recent GI bleed and recent spinal surgery. Endoscopy 10/17. Intubated 10/19-10/24. PMhx: T2DM, retinopathy, neuropathy, HTN, HLD, CKD, covid July 2022.    PT Comments    Pt initially lethargic, but aroused to a participative level.  Pt's precautions reinforced.  Worked on rolling and transition to sitting.  Worked on asymmetrical scooting to EOB, sitting balance with emphasis on upright posture and forward reaching.  Progressed to standing x2 at EOB with chair back for support and padding for assist, then stand/scooted x3 up to [facility] before return to sidelying.    Recommendations for follow up therapy are one component of a multi-disciplinary discharge planning process, led by the attending physician.  Recommendations may be updated based on patient status, additional functional criteria and insurance authorization.  Follow Up Recommendations  Acute inpatient rehab (3hours/day)     Assistance Recommended at Discharge Frequent or constant Supervision/Assistance  Equipment Recommendations  Other (comment) (TBD)    Recommendations for Other Services Rehab consult     Precautions / Restrictions Precautions Precautions: Fall;Back Precaution Comments: cortrak     Mobility  Bed Mobility Overal bed mobility: Needs Assistance Bed Mobility: Rolling;Sidelying to Sit;Sit to Sidelying Rolling: Max assist;+2 for physical assistance Sidelying to sit: Max assist;+2 for physical assistance     Sit to sidelying: Max assist;+2 for physical assistance General bed mobility comments: cues for technique based on precautions, truncal and LE assist both  in/out of bed.    Transfers Overall transfer level: Needs assistance Equipment used:  (chair back) Transfers: Sit to/from Stand;Lateral/Scoot Transfers Sit to Stand: Max assist;+2 physical assistance          Lateral/Scoot Transfers: Max assist;+2 physical assistance;+2 safety/equipment (more stand/scoot transfers up toward [facility]) General transfer comment: cues for hand placement/direction.  Assist forward and with boost using padding as able.    Ambulation/Gait                 Stairs             Wheelchair Mobility    Modified Rankin (Stroke Patients Only)       Balance Overall balance assessment: Needs assistance Sitting-balance support: Feet supported;Single extremity supported;Bilateral upper extremity supported Sitting balance-Leahy Scale: Poor Sitting balance - Comments: worked on upright sitting in midline with UE assist, pt needing mod to max as he fatigued and became more painful.  completed forward reaching task with additional intention to keep pt in an upright posture.   Standing balance support: Bilateral upper extremity supported Standing balance-Leahy Scale: Poor Standing balance comment: pt unable to come fully upright without significant truncal/pelvic support and boost                            Cognition Arousal/Alertness: Lethargic;Awake/alert Behavior During Therapy: Flat affect Overall Cognitive Status: Impaired/Different from baseline                   Orientation Level: Disoriented to;Time Current Attention Level: Sustained Memory: Decreased short-term memory;Decreased recall of precautions Following Commands: Follows one step commands inconsistently;Follows one step commands with increased time Safety/Judgement: Decreased awareness of safety;Decreased awareness of deficits Awareness: Emergent Problem Solving: Slow  processing;Decreased initiation;Difficulty sequencing;Requires verbal cues;Requires tactile cues           Exercises      General Comments General comments (skin integrity, edema, etc.): reinforced back precautions, initially relating to bed mobility.   vss overall      Pertinent Vitals/Pain Pain Assessment: Faces Faces Pain Scale: Hurts even more Pain Location: incision site between shoulder blades Pain Descriptors / Indicators: Guarding;Grimacing Pain Intervention(s): Monitored during session;Limited activity within patient's tolerance    Home Living                          Prior Function            PT Goals (current goals can now be found in the care plan section) Acute Rehab PT Goals Patient Stated Goal: less pain PT Goal Formulation: With patient Time For Goal Achievement: 08/26/21 Potential to Achieve Goals: Good Progress towards PT goals: Progressing toward goals    Frequency    Min 3X/week      PT Plan Current plan remains appropriate    Co-evaluation   Reason for Co-Treatment: Complexity of the patient's impairments (multi-system involvement) PT goals addressed during session: Mobility/safety with mobility OT goals addressed during session: ADL's and self-care;Strengthening/ROM      AM-PAC PT "6 Clicks" Mobility   Outcome Measure  Help needed turning from your back to your side while in a flat bed without using bedrails?: Total Help needed moving from lying on your back to sitting on the side of a flat bed without using bedrails?: Total Help needed moving to and from a bed to a chair (including a wheelchair)?: Total Help needed standing up from a chair using your arms (e.g., wheelchair or bedside chair)?: Total Help needed to walk in hospital room?: Total Help needed climbing 3-5 steps with a railing? : Total 6 Click Score: 6    End of Session Equipment Utilized During Treatment: Oxygen Activity Tolerance: Patient limited by fatigue;Patient limited by pain Patient left: in bed;with call bell/phone within reach;with bed alarm  set Nurse Communication: Mobility status PT Visit Diagnosis: Other abnormalities of gait and mobility (R26.89);Muscle weakness (generalized) (M62.81)     Time: 7915-0569 PT Time Calculation (min) (ACUTE ONLY): 23 min  Charges:  $Therapeutic Activity: 8-22 mins                     08/21/2021  Ginger Carne., PT Acute Rehabilitation Services [phone]  (pager) [phone]  (office)   Tessie Fass Jose [name] 08/21/2021, 1:50 PM

## 2021-08-21 NOTE — Consult Note (Signed)
[place] Nurse wound follow up Patient receiving care in [facility] 3M09 Wound type: Evolving DTPI of the intergluteal cleft. Now unstageable with black base and painful to the patient.  Back wound is 99% healed. Left big toe wound is 95% healed. Drainage (amount, consistency, odor) Brown sanguinous drainage on the dressing.  Periwound: intact Dressing procedure/placement/frequency: Clean the buttock area with no rinse cleanser. Pat dry and apply a nickel thick layer of Santyl over the blackened area, cover with a moistened saline gauze and secure with foam dressing. Carefully peel back foam dressing to apply santyl daily. May use sacral foam dressing up to 3 days unless soiled.  Monitor the wound area(s) for worsening of condition such as: Signs/symptoms of infection, increase in size, development of or worsening of odor, development of pain, or increased pain at the affected locations.   Notify the medical team if any of these develop.  Pressure Injury Prevention Bundle May use any that apply to this patient. Support surfaces (air mattress) chair cushion Kellie Simmering # [phone]) Heel offloading boots Kellie Simmering # [phone]) Turning and Positioning  Measures to reduce shear (draw sheet, knees up) Skin protection Products (Foam dressing) Moisture management products (Critic-Aid Barrier Cream (Purple top) Sween moisturizing lotion (Pink top in clean supply) Nutrition Management Protection for Medical Devices Routine Skin Assessment   Thank you for the consult. [place] nurse will not follow at this time.   Please re-consult the [place] team if needed.  Cathlean Marseilles Tamala Julian, MSN, RN, [place], Lysle Pearl, [facility] Wound Treatment Associate Pager [phone]

## 2021-08-21 NOTE — Progress Notes (Signed)
Patient ID: Jose [name], male   DOB: May 01, 1976, 45 y.o.   MRN: [id number] [facility] Gastroenterology Progress Note  Jose [name] 45 y.o. 11/28/1975   Subjective: Large melenic stool last night. Coughed or vomited up small amount of red blood. Patient denies abdominal pain. Tube feeds infusing at 60 cc/hr.  Objective: Vital signs: Vitals:   08/21/21 0830 08/21/21 0835  BP: 127/67   Pulse:  (!) 113  Resp:  (!) 25  Temp:    SpO2:  93%    Physical Exam: Gen: lethargic, mild acute distress, chronically ill-appearing HEENT: anicteric sclera CV: RRR Chest: CTA B Abd: diffuse tenderness with guarding, +BS, nondistended Ext: no edema  Lab Results: Recent Labs    08/20/21 0327 08/21/21 0515 08/21/21 0625  NA 148* 152* 156*  K 4.6 4.1 4.2  CL 118* 120*  --   CO2 22 23  --   GLUCOSE 258* 179*  --   BUN 114* 96*  --   CREATININE 2.94* 2.26*  --   CALCIUM 9.1 9.0  --   PHOS 4.3 4.6  --    Recent Labs    08/19/21 0430 08/20/21 0327 08/21/21 0515  AST 107*  --   --   ALT 46*  --   --   ALKPHOS 596*  --   --   BILITOT 2.4*  --   --   PROT 6.1*  --   --   ALBUMIN 2.3* 2.2* 2.1*   Recent Labs    08/20/21 0327 08/20/21 1321 08/21/21 0515 08/21/21 0625  WBC 21.1*  --  23.7*  --   NEUTROABS 16.1*  --  18.9*  --   HGB 7.7*   < > 8.0* 7.8*  HCT 23.4*   < > 24.4* 23.0*  MCV 86.3  --  88.1  --   PLT 338  --  337  --    < > = values in this interval not displayed.      Assessment/Plan: GI bleed with EGD 08/10/21 showing severe erosive esophagitis and duodenal ulcers and black fluid in stomach. Changed to Protonix drip. Tube feeds d/c. Elevated WBC. Will do abdominal series to evaluate for free air. If signs of active bleeding, then will need a repeat EGD. Dr. Watt Climes on call this weekend and will re-assess.    Lear Ng 08/21/2021, 9:15 AM  Questions please call [phone]

## 2021-08-21 NOTE — Progress Notes (Signed)
Lower extremity venous has been completed.   Preliminary results in CV Proc.   Jose [name] 08/21/2021 9:32 AM

## 2021-08-21 NOTE — Progress Notes (Signed)
SLP Cancellation Note  Patient Details Name: Jose [name] MRN: [id number] DOB: 1976/08/30   Cancelled treatment:        Spoke with RN and GI.  Pt is not appropriate for MBSS at this time 2/2 possible GIB.  ST will monitor for medical readiness.    Celedonio Savage, [place], [place] Office: [phone] 08/21/2021, 9:14 AM

## 2021-08-21 NOTE — Progress Notes (Addendum)
1000 - MD paged for concerns.  Patient tachycardic, increased RR >25, desaturations to the 80s, SBP >180 . Patient stating that he is in 10/10 back pain. No PRN pain medications available, see new orders.  - Patient RUE noted to have 2+ edema, MD notified, see new orders  1200- wound consult called for reevaluation for worsening wounds on sacrum, see new orders - MD and IV team notified of IV access issues, see new orders.

## 2021-08-21 NOTE — Progress Notes (Signed)
Subjective:   Bloody BM overnight - 1u pRBC ordered, plans for repeat GI consult.  Hb from low 8s to 7.8 this AM.  BUN/Cr 96 / 2.3 from 114/ 2.9.  Sodium trending up to 152 > 156.  I/Os yesterday 2.8 / 2.5 all UOP. TF off currently   Objective Vital signs in last 24 hours: Vitals:   08/21/21 0408 08/21/21 0453 08/21/21 0500 08/21/21 0751  BP: (!) 174/87 (!) 182/85 (!) 171/81   Pulse: (!) 110  (!) 103   Resp: (!) 27  (!) 33   Temp: 98.1 F (36.7 C)   98.2 F (36.8 C)  TempSrc: Oral   Oral  SpO2: 95%  94%   Weight:   97.8 kg   Height:       Weight change: 5.9 kg  Intake/Output Summary (Last 24 hours) at 08/21/2021 0827 Last data filed at 08/21/2021 0408 Gross per 24 hour  Intake 1306.01 ml  Output 1475 ml  Net -168.99 ml     Assessment/ Plan: Pt is a 45 y.o. yo male CKD ( crt 1.5-2) , DM, HTN who was admitted on 08/06/2021 with MSSA bacteremia/osteomyelitis- s/p T4/5 laminectomy, pediculetomy and spinal cord decompression-  complicating A on CRF Assessment/Plan: 1. Renal-  baseline crt 1.5-2.  Now with A on CRF in the setting of above-  non oliguric and UOP increasing.  crt solidly trending down - BUN as well. No  need for HD , moving further away from that possibility. clinically improving so hope that we will continue to see improvement - cont to watch closely.  Ok to d/c foley, but maintain I/Os.  2. HTN/vol-  overloaded but UOP improving on own, no indication for diuresis today 3. Anemia-  despite report of melena overnight Hb actually did go up with 1u pRBC overnight-  on ESA-  no iron due to osteo-  hgb dramatic drop 2 days ago-  2 units given on 10/26 and another 10/28-  now on protonix, hgb stable since-  given DDAVP for uremic platelets a few days ago, will repeat dose given BUN 96.  4. Hyperkalemia-  currently K ok - cont to follow closely.  5. Hypernatremia-  start D5W in light of NPO and can't give enteral water today  Justin Mend    Labs: Basic Metabolic  Panel: Recent Labs  Lab 08/15/21 0545 08/16/21 0454 08/19/21 2155 08/20/21 0327 08/21/21 0515 08/21/21 0625  NA 140   < > 149* 148* 152* 156*  K 4.6   < > 4.5 4.6 4.1 4.2  CL 107   < > 116* 118* 120*  --   CO2 22   < > 21* 22 23  --   GLUCOSE 225*   < > 195* 258* 179*  --   BUN 97*   < > 119* 114* 96*  --   CREATININE 4.23*   < > 3.00* 2.94* 2.26*  --   CALCIUM 8.3*   < > 9.1 9.1 9.0  --   PHOS 3.2  --   --  4.3 4.6  --    < > = values in this interval not displayed.    Liver Function Tests: Recent Labs  Lab 08/17/21 0400 08/18/21 0334 08/19/21 0430 08/20/21 0327 08/21/21 0515  AST 155* 74* 107*  --   --   ALT 114* 77* 46*  --   --   ALKPHOS 715* 636* 596*  --   --   BILITOT 2.8* 3.0* 2.4*  --   --  PROT 5.9* 6.3* 6.1*  --   --   ALBUMIN <1.5* 2.1* 2.3* 2.2* 2.1*    No results for input(s): LIPASE, AMYLASE in the last 168 hours. No results for input(s): AMMONIA in the last 168 hours. CBC: Recent Labs  Lab 08/17/21 0400 08/18/21 0334 08/19/21 0430 08/19/21 0520 08/20/21 0327 08/20/21 1321 08/20/21 2318 08/21/21 0515 08/21/21 0625  WBC 21.4* 21.7* 20.1*  --  21.1*  --   --  23.7*  --   NEUTROABS  --   --   --   --  16.1*  --   --  18.9*  --   HGB 8.3* 8.1* 5.9*   < > 7.7*   < > 7.1* 8.0* 7.8*  HCT 26.5* 26.1* 19.1*   < > 23.4*   < > 22.1* 24.4* 23.0*  MCV 90.8 92.6 92.7  --  86.3  --   --  88.1  --   PLT 352 360 298  --  338  --   --  337  --    < > = values in this interval not displayed.    Cardiac Enzymes: Recent Labs  Lab 08/14/21 1700  CKTOTAL 25*    CBG: Recent Labs  Lab 08/20/21 1610 08/20/21 2012 08/20/21 2346 08/21/21 0433 08/21/21 0747  GLUCAP 142* 125* 190* 168* 143*     Iron Studies: No results for input(s): IRON, TIBC, TRANSFERRIN, FERRITIN in the last 72 hours. Studies/Results: NM Pulmonary Perfusion  Result Date: 08/20/2021 CLINICAL DATA:  Recent COVID-19 infection.  Short of breath EXAM: NUCLEAR MEDICINE PERFUSION LUNG  SCAN TECHNIQUE: Perfusion images were obtained in multiple projections after intravenous injection of radiopharmaceutical. RADIOPHARMACEUTICALS:  Chest radiograph 08/20/2021 mCi Tc-50m MAA COMPARISON:  None. FINDINGS: Decreased regional perfusion to the LEFT upper lobe which has a more central photopenia than a peripheral photopenia. Pattern not typical of pulmonary embolism. No perfusion defects in the RIGHT lung. Comparison chest radiograph demonstrates diffuse bilateral airspace disease. IMPRESSION: Large perfusion abnormality in the LEFT upper lobe which has a more regional distribution than segmental. Pattern not typical of pulmonary embolism however cannot exclude pulmonary embolism. Differential would include pulmonary embolism versus pneumonia. Intermediate probability. Electronically Signed   By: Suzy Bouchard M.D.   On: 08/20/2021 16:20   DG CHEST PORT 1 VIEW  Result Date: 08/20/2021 CLINICAL DATA:  Nausea and vomiting. EXAM: PORTABLE CHEST 1 VIEW COMPARISON:  Chest x-ray dated August 18, 2021. FINDINGS: Unchanged feeding tube and right upper extremity PICC line. Stable cardiomediastinal silhouette. Similar perihilar opacities throughout the left lung. Unchanged mild right basilar opacity. No pleural effusion or pneumothorax. No acute osseous abnormality. IMPRESSION: 1. Similar left lung pneumonia. 2. Unchanged mild right basilar atelectasis versus infiltrate. Electronically Signed   By: Titus Dubin M.D.   On: 08/20/2021 14:37   Medications: Infusions:  sodium chloride 250 mL (08/20/21 2045)   ampicillin-sulbactam (UNASYN) IV 3 g (08/21/21 0506)   feeding supplement (VITAL 1.5 CAL) 1,000 mL (08/21/21 0438)   pantoprazole 8 mg/hr (08/21/21 0428)   promethazine (PHENERGAN) injection (IM or IVPB) 25 mg (08/10/21 0506)    Scheduled Medications:  sodium chloride   Intravenous Once   sodium chloride   Intravenous Once   amLODipine  5 mg Per NG tube Daily   Chlorhexidine Gluconate Cloth   6 each Topical Daily   darbepoetin (ARANESP) injection - NON-DIALYSIS  150 mcg Subcutaneous Q Mon-1800   feeding supplement  237 mL Oral TID BM   feeding supplement (PROSource  TF)  45 mL Per Tube BID   fluconazole  400 mg Per Tube Daily   free water  200 mL Per Tube Q6H   gabapentin  200 mg Per Tube Q12H   insulin aspart  0-20 Units Subcutaneous Q4H   insulin aspart  8 Units Subcutaneous Q4H   insulin glargine-yfgn  15 Units Subcutaneous BID   lidocaine  2 patch Transdermal Q24H   living well with diabetes book   Does not apply Once   LORazepam  0.5 mg Intravenous Q6H   mouth rinse  15 mL Mouth Rinse BID   melatonin  3 mg Oral QHS   metoprolol tartrate  25 mg Per NG tube BID   mupirocin ointment  1 application Topical BID   [START ON 08/24/2021] pantoprazole  40 mg Intravenous Q12H   sodium chloride flush  10-40 mL Intracatheter Q12H   sodium chloride flush  3 mL Intravenous Q12H   sucralfate  1 g Per Tube TID WC & HS   traZODone  100 mg Per Tube QHS   venlafaxine  37.5 mg Per Tube Daily    have reviewed scheduled and prn medications.  Physical Exam: General:  lying on side in no distress but seems somewhat lethargic after pain meds Heart: RRR Lungs: Deep sighing respirations but clear Abdomen: soft, no TTP  Extremities: 1+ pitting edema    08/21/2021,8:27 AM  LOS: 15 days

## 2021-08-21 NOTE — Progress Notes (Signed)
Peripherally Inserted Central Catheter Placement  The IV Nurse has discussed with the patient and/or persons authorized to consent for the patient, the purpose of this procedure and the potential benefits and risks involved with this procedure.  The benefits include less needle sticks, lab draws from the catheter, and the patient may be discharged home with the catheter. Risks include, but not limited to, infection, bleeding, blood clot (thrombus formation), and puncture of an artery; nerve damage and irregular heartbeat and possibility to perform a PICC exchange if needed/ordered by physician.  Alternatives to this procedure were also discussed.  Bard Power PICC patient education guide, fact sheet on infection prevention and patient information card has been provided to patient /or left at bedside.    PICC Placement Documentation  PICC Triple Lumen 08/21/21 PICC Right Brachial 40 cm 0 cm (Active)  Indication for Insertion or Continuance of Line Prolonged intravenous therapies 08/21/21 1500  Exposed Catheter (cm) 0 cm 08/21/21 1500  Site Assessment Clean;Dry;Intact 08/21/21 1500  Lumen #1 Status Flushed;Blood return noted 08/21/21 1500  Lumen #2 Status Flushed;Blood return noted 08/21/21 1500  Lumen #3 Status Flushed;Blood return noted 08/21/21 1500  Dressing Type Transparent 08/21/21 1500  Dressing Status Clean;Dry;Intact 08/21/21 1500  Antimicrobial disc in place? Yes 08/21/21 1500  Dressing Change Due 08/28/21 08/21/21 1500       Jule Economy Horton 08/21/2021, 3:56 PM

## 2021-08-22 DIAGNOSIS — M4624 Osteomyelitis of vertebra, thoracic region: Secondary | ICD-10-CM | POA: Diagnosis not present

## 2021-08-22 LAB — RENAL FUNCTION PANEL
Albumin: 1.8 g/dL — ABNORMAL LOW (ref 3.5–5.0)
Anion gap: 7 (ref 5–15)
BUN: 76 mg/dL — ABNORMAL HIGH (ref 6–20)
CO2: 21 mmol/L — ABNORMAL LOW (ref 22–32)
Calcium: 8 mg/dL — ABNORMAL LOW (ref 8.9–10.3)
Chloride: 111 mmol/L (ref 98–111)
Creatinine, Ser: 1.99 mg/dL — ABNORMAL HIGH (ref 0.61–1.24)
GFR, Estimated: 41 mL/min — ABNORMAL LOW (ref >60)
Glucose, Bld: 436 mg/dL — ABNORMAL HIGH (ref 70–99)
Phosphorus: 5 mg/dL — ABNORMAL HIGH (ref 2.5–4.6)
Potassium: 3.5 mmol/L (ref 3.5–5.1)
Sodium: 139 mmol/L (ref 135–145)

## 2021-08-22 LAB — CBC
HCT: 20.9 % — ABNORMAL LOW (ref 39.0–52.0)
HCT: 23.7 % — ABNORMAL LOW (ref 39.0–52.0)
Hemoglobin: 6.4 g/dL — CL (ref 13.0–17.0)
Hemoglobin: 7.3 g/dL — ABNORMAL LOW (ref 13.0–17.0)
MCH: 29.2 pg (ref 26.0–34.0)
MCH: 29.3 pg (ref 26.0–34.0)
MCHC: 30.6 g/dL (ref 30.0–36.0)
MCHC: 30.8 g/dL (ref 30.0–36.0)
MCV: 95.4 fL (ref 80.0–100.0)
MCV: 95.2 fL (ref 80.0–100.0)
Platelets: 298 10*3/uL (ref 150–400)
Platelets: 307 10*3/uL (ref 150–400)
RBC: 2.19 MIL/uL — ABNORMAL LOW (ref 4.22–5.81)
RBC: 2.49 MIL/uL — ABNORMAL LOW (ref 4.22–5.81)
RDW: 18.3 % — ABNORMAL HIGH (ref 11.5–15.5)
RDW: 17.3 % — ABNORMAL HIGH (ref 11.5–15.5)
WBC: 22.5 10*3/uL — ABNORMAL HIGH (ref 4.0–10.5)
WBC: 27.1 10*3/uL — ABNORMAL HIGH (ref 4.0–10.5)
nRBC: 0 % (ref 0.0–0.2)
nRBC: 0 % (ref 0.0–0.2)

## 2021-08-22 LAB — GLUCOSE, CAPILLARY
Glucose-Capillary: 104 mg/dL — ABNORMAL HIGH (ref 70–99)
Glucose-Capillary: 113 mg/dL — ABNORMAL HIGH (ref 70–99)
Glucose-Capillary: 152 mg/dL — ABNORMAL HIGH (ref 70–99)
Glucose-Capillary: 185 mg/dL — ABNORMAL HIGH (ref 70–99)
Glucose-Capillary: 211 mg/dL — ABNORMAL HIGH (ref 70–99)
Glucose-Capillary: 220 mg/dL — ABNORMAL HIGH (ref 70–99)

## 2021-08-22 LAB — PREPARE RBC (CROSSMATCH)

## 2021-08-22 MED ORDER — SODIUM CHLORIDE 0.9% IV SOLUTION: Freq: Once | INTRAVENOUS | Status: AC

## 2021-08-22 NOTE — Progress Notes (Signed)
Hoyt Koch Ayuso 12:16 PM  Subjective: Patient seen and examined in hospital computer chart reviewed including his previous endoscopy and case discussed with his nurse and he has not had any signs of bleeding today and mentally he is actually doing a little better according to her and he has no GI complaints and is able to swallow but is just not eating very much and has no new specific complaints  Objective: Vital signs stable afebrile no acute distress abdomen is soft nontender BUN and creatinine continue to decrease nicely unfortunately hemoglobin decreased with that as well  Assessment: Periodic GI blood loss in patient with significant medical issues  Plan: Will check on tomorrow and will consider repeat endoscopy probably early next week unless signs of active bleeding and please call me sooner if any specific GI question or problem  [facility]  office [phone] After 5PM or if no answer call [phone]

## 2021-08-22 NOTE — Progress Notes (Signed)
PROGRESS NOTE    Jose [name]  EQA:[id number] DOB: Oct 22, 1976 DOA: 08/06/2021 PCP: [name], NP   Brief Narrative:  Jose [name] is a 45 y.o. with a pertinent PMH of GAD, HTN, HLD, macular degeneration, T2DM, CKD (Stage 3), tobacco use disorder, hx of pleural effusion, recent admission for MSSA bacteremia/osteomyelitis who was discharged home with IV antibiotics on 08/03/2021, returned to the ED on 08/07/2021 with worsening back pain and admitted under [facility], now s/p T4-T5 laminectomy, left T5 pediculotomy, and spinal cord decompression. On 10/17 patient developed coffee-ground emesis, s/p EGD showing circumferential esophagitis with hemorrhage and non-bleeding duodenal ulcers.On 10/17, pccm was consulted for acute hypoxia.    Significant Events: 10/13 -admitted with worsening thoracic osteomyelitis, constipation/abd distention, neurosurgery consulted  10/14 - T4-T5 laminectomy, left T5 pediculotomy, and spinal cord decompression 10/17 - Hgb drop to 6.2, coffee ground emesis s/p EGD with esophagitis and peptic ulcer 10/17 - hypoxia and pccm consult  10/19 intubated 10/21 developed worsening kidney function, nephro consulted.  10/24 extubated Transferred under [facility] on 08/18/2021.  Assessment & Plan:   Principal Problem:   Osteomyelitis of thoracic spine (HCC) Active Problems:   Type 2 diabetes mellitus with diabetic polyneuropathy, without long-term current use of insulin (HCC)   Hyperlipidemia   GAD (generalized anxiety disorder)   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)   Essential hypertension   CKD (chronic kidney disease) stage 3, GFR 30-59 ml/min (HCC)   Pleural effusion, bilateral  Acute hypoxic respiratory failure/aspiration pneumonia, pneumonitis/MSSA bacteremia: Patient required intubation on 08/12/2021, extubated on 08/17/2021, oxygenation improved, currently on 5 L and maintaining oxygen saturation at 96%.  He recently completed course of Unasyn.  Chest x-ray  08/19/2019 shows persistent left lung pneumonia and worsening of atelectasis and consolidation at lung bases.  Procalcitonin improved though.  Antibiotics were switched to Unasyn but with improved procalcitonin, concern for aspiration pneumonia was very low and after discussing with pharmacy, we switched back to cefazolin since that has better coverage for bacteremia.   Acute blood loss anemia/upper GI bleed/candidal esophagitis and PUD s/p EGD/possible PE: Patient had acute drop in hemoglobin from 8.1-5.9 within 24 hours on the morning of 08/19/2021, received 2 units of PRBC transfusion.  Hemoglobin improved to 7.7 but then dropped to 7.1 again early morning and he had a large melanotic stool so 1 unit of PRBC transfusion was ordered by night hospitalist.  He might need another EGD.  I consulted GI again on 08/21/2021.  They stopped his tube feedings for possible EGD today.  Patient has not had any further melanotic stools since yesterday however his hemoglobin once again dropped to 6.4.  I will transfuse 1 unit PRBC.  GI is aware of drop in hemoglobin.  They are not planning on any EGD and they have asked the nurse to resume his tube feedings.  We will continue him on Protonix drip.    Also his VQ scan cannot rule out PE on the left side, intermediate probability.  Doppler lower extremity and left upper extremity negative for any DVT.  PE cannot be ruled out completely but unfortunately due to recurrent acute blood loss anemia, we cannot initiate him on any anticoagulation.  Hopefully once his renal function improves, we will do CT angiogram of chest for definitive study but with negative DVT, chances of PE are much less now.   ATN on top of acute on chronic kidney disease stage IIIa: baseline creatinine of 1.5-2.0.  Creatinine peaked at 4.5 on 08/17/2021,  now improving, down to 1.99  Today.  Good diuresis.  Nephrology on board and management per them.  Avoid nephrotoxic agents.   Hyperkalemia:  Resolved.  Hyponatremia: Resolved.  Nephrology managing.   Diabetes mellitus type 2 with hyperglycemia: Blood sugar improving but very labile.  Currently on Semglee 10 units, NovoLog 8 units every 4 hours and SSI.  We will continue this. Core track and placed since 08/12/2021.   Shock liver: Continue conservative management LFTs are improving.   Sinus tachycardia: Had a transesophageal echo on 04/28/2021 that showed an EF of 60% no wall motion abnormality.  Before admission was on verapamil.  Continue Lopressor 25 mg p.o. twice daily.   Diabetic ulcer of left great toe ([place]): Noted.   Essential hypertension blood pressure labile, was low yesterday and now elevated.  I am afraid if we increase medications, he may drop further.  Thus we will continue current dose of Lopressor and amlodipine.   Pleural effusion, bilateral Due to third spacing, he was giving albumin with good diuresis.   Sacral decubitus ulcer stage II present on admission: Wound care on board.  DVT prophylaxis: SCDs Start: 08/06/21 1536   Code Status: Full Code  Family Communication: None at bedside.  Discussed with his wife over the phone while I was in the room.  Status is: Inpatient  Remains inpatient appropriate because: Still very sick  Estimated body mass index is 27.86 kg/m as calculated from the following:   Height as of this encounter: 5\' 11"  (1.803 m).   Weight as of this encounter: 90.6 kg.  Pressure Injury 08/10/21 Coccyx Medial Stage 2 -  Partial thickness loss of dermis presenting as a shallow open injury with a red, pink wound bed without slough. (Active)  08/10/21 1700  Location: Coccyx (gluteal crease)  Location Orientation: Medial  Staging: Stage 2 -  Partial thickness loss of dermis presenting as a shallow open injury with a red, pink wound bed without slough.  Wound Description (Comments):   Present on Admission: Yes     Pressure Injury 08/14/21 Buttocks Bilateral Deep Tissue Pressure Injury  - Purple or maroon localized area of discolored intact skin or blood-filled blister due to damage of underlying soft tissue from pressure and/or shear. DTI (Active)  08/14/21 0800  Location: Buttocks  Location Orientation: Bilateral  Staging: Deep Tissue Pressure Injury - Purple or maroon localized area of discolored intact skin or blood-filled blister due to damage of underlying soft tissue from pressure and/or shear.  Wound Description (Comments): DTI  Present on Admission:     Nutritional Assessment: Body mass index is 27.86 kg/m.Marland Kitchen Seen by dietician.  I agree with the assessment and plan as outlined below: Nutrition Status: Nutrition Problem: Increased nutrient needs Etiology: post-op healing Signs/Symptoms: estimated needs Interventions: Ensure Enlive (each supplement provides 350kcal and 20 grams of protein), Tube feeding, Refer to RD note for recommendations  .  Skin Assessment: I have examined the patient's skin and I agree with the wound assessment as performed by the wound care RN as outlined below: Pressure Injury 08/10/21 Coccyx Medial Stage 2 -  Partial thickness loss of dermis presenting as a shallow open injury with a red, pink wound bed without slough. (Active)  08/10/21 1700  Location: Coccyx (gluteal crease)  Location Orientation: Medial  Staging: Stage 2 -  Partial thickness loss of dermis presenting as a shallow open injury with a red, pink wound bed without slough.  Wound Description (Comments):   Present on Admission: Yes  Pressure Injury 08/14/21 Buttocks Bilateral Deep Tissue Pressure Injury - Purple or maroon localized area of discolored intact skin or blood-filled blister due to damage of underlying soft tissue from pressure and/or shear. DTI (Active)  08/14/21 0800  Location: Buttocks  Location Orientation: Bilateral  Staging: Deep Tissue Pressure Injury - Purple or maroon localized area of discolored intact skin or blood-filled blister due to damage of  underlying soft tissue from pressure and/or shear.  Wound Description (Comments): DTI  Present on Admission:     Consultants:  PCCM and ID  Procedures:  As above  Antimicrobials:  Anti-infectives (From admission, onward)    Start     Dose/Rate Route Frequency Ordered Stop   08/21/21 1300  ceFAZolin (ANCEF) IVPB 2g/100 mL premix        2 g 200 mL/hr over 30 Minutes Intravenous Every 8 hours 08/21/21 0918     08/21/21 1000  fluconazole (DIFLUCAN) tablet 400 mg  Status:  Discontinued        400 mg Per Tube Daily 08/20/21 1104 08/21/21 0925   08/21/21 1000  fluconazole (DIFLUCAN) IVPB 400 mg        400 mg 100 mL/hr over 120 Minutes Intravenous Every 24 hours 08/21/21 0925 08/27/21 0959   08/20/21 1200  fluconazole (DIFLUCAN) tablet 200 mg        200 mg Per Tube  Once 08/20/21 1104 08/20/21 1207   08/20/21 1000  fluconazole (DIFLUCAN) tablet 200 mg  Status:  Discontinued        200 mg Per Tube Daily 08/20/21 0825 08/20/21 1104   08/20/21 0047  Ampicillin-Sulbactam (UNASYN) 3 g in sodium chloride 0.9 % 100 mL IVPB  Status:  Discontinued        3 g 200 mL/hr over 30 Minutes Intravenous Every 12 hours 08/19/21 1619 08/19/21 1621   08/19/21 2100  Ampicillin-Sulbactam (UNASYN) 3 g in sodium chloride 0.9 % 100 mL IVPB  Status:  Discontinued        3 g 200 mL/hr over 30 Minutes Intravenous Every 8 hours 08/19/21 1621 08/21/21 0918   08/19/21 1200  Ampicillin-Sulbactam (UNASYN) 3 g in sodium chloride 0.9 % 100 mL IVPB  Status:  Discontinued        3 g 200 mL/hr over 30 Minutes Intravenous Every 8 hours 08/19/21 1055 08/19/21 1619   08/19/21 1015  fluconazole (DIFLUCAN) IVPB 200 mg  Status:  Discontinued        200 mg 100 mL/hr over 60 Minutes Intravenous Every 24 hours 08/19/21 0916 08/20/21 0825   08/17/21 2300  ceFAZolin (ANCEF) IVPB 2g/100 mL premix  Status:  Discontinued        2 g 200 mL/hr over 30 Minutes Intravenous Every 12 hours 08/17/21 1054 08/19/21 1055   08/16/21 1000   fluconazole (DIFLUCAN) tablet 200 mg  Status:  Discontinued        200 mg Per Tube Daily 08/15/21 1414 08/19/21 0916   08/15/21 2314  Ampicillin-Sulbactam (UNASYN) 3 g in sodium chloride 0.9 % 100 mL IVPB  Status:  Discontinued        3 g 200 mL/hr over 30 Minutes Intravenous Every 12 hours 08/15/21 1410 08/17/21 1054   08/13/21 1000  fluconazole (DIFLUCAN) tablet 400 mg  Status:  Discontinued        400 mg Per Tube Daily 08/12/21 1148 08/15/21 1414   08/12/21 1000  fluconazole (DIFLUCAN) tablet 400 mg  Status:  Discontinued        400 mg  Oral Daily 08/12/21 0913 08/12/21 1148   08/11/21 1800  vancomycin (VANCOREADY) IVPB 1250 mg/250 mL  Status:  Discontinued        1,250 mg 166.7 mL/hr over 90 Minutes Intravenous Every 24 hours 08/10/21 1651 08/11/21 0939   08/11/21 1030  ceFEPIme (MAXIPIME) 2 g in sodium chloride 0.9 % 100 mL IVPB  Status:  Discontinued        2 g 200 mL/hr over 30 Minutes Intravenous Every 12 hours 08/11/21 0936 08/11/21 0939   08/11/21 1030  Ampicillin-Sulbactam (UNASYN) 3 g in sodium chloride 0.9 % 100 mL IVPB  Status:  Discontinued        3 g 200 mL/hr over 30 Minutes Intravenous Every 6 hours 08/11/21 0940 08/15/21 1410   08/10/21 1730  vancomycin (VANCOREADY) IVPB 1750 mg/350 mL        1,750 mg 175 mL/hr over 120 Minutes Intravenous  Once 08/10/21 1651 08/10/21 1956   08/10/21 1715  piperacillin-tazobactam (ZOSYN) IVPB 3.375 g  Status:  Discontinued        3.375 g 12.5 mL/hr over 240 Minutes Intravenous Every 8 hours 08/10/21 1651 08/11/21 0936   08/06/21 2200  ceFAZolin (ANCEF) IVPB 2g/100 mL premix  Status:  Discontinued        2 g 200 mL/hr over 30 Minutes Intravenous Every 8 hours 08/06/21 1829 08/10/21 1631   08/06/21 1415  vancomycin (VANCOREADY) IVPB 1750 mg/350 mL  Status:  Discontinued        1,750 mg 175 mL/hr over 120 Minutes Intravenous Every 24 hours 08/06/21 1400 08/06/21 1829   08/06/21 1415  ceFEPIme (MAXIPIME) 2 g in sodium chloride 0.9 % 100 mL  IVPB  Status:  Discontinued        2 g 200 mL/hr over 30 Minutes Intravenous Every 8 hours 08/06/21 1400 08/06/21 1829          Subjective:  Patient seen and examined.  He is looking better and he is feeling better as well.  Complaining of back pain only.  Objective: Vitals:   08/22/21 0930 08/22/21 1000 08/22/21 1002 08/22/21 1030  BP:   (!) 160/80   Pulse: (!) 111 (!) 113  (!) 112  Resp: (!) 30 (!) 31  (!) 22  Temp:      TempSrc:      SpO2: 96% 97%  97%  Weight:      Height:        Intake/Output Summary (Last 24 hours) at 08/22/2021 1030 Last data filed at 08/22/2021 1000 Gross per 24 hour  Intake 2689.73 ml  Output 1775 ml  Net 914.73 ml    Filed Weights   08/20/21 0442 08/21/21 0500 08/22/21 0500  Weight: 91.9 kg 97.8 kg 90.6 kg    Examination:  General exam: Appears calm and comfortable but slightly tachypneic Respiratory system: Clear to auscultation.  Slightly tachypneic. Cardiovascular system: S1 & S2 heard, sinus tachycardia no JVD, murmurs, rubs, gallops or clicks. No pedal edema. Gastrointestinal system: Abdomen is nondistended, soft and nontender. No organomegaly or masses felt. Normal bowel sounds heard. Central nervous system: Alert and oriented. No focal neurological deficits. Extremities: Symmetric 5 x 5 power.    Data Reviewed: I have personally reviewed following labs and imaging studies  CBC: Recent Labs  Lab 08/18/21 0334 08/19/21 0430 08/19/21 0520 08/20/21 0327 08/20/21 1321 08/20/21 2318 08/21/21 0515 08/21/21 0625 08/22/21 0814  WBC 21.7* 20.1*  --  21.1*  --   --  23.7*  --  22.5*  NEUTROABS  --   --   --  16.1*  --   --  18.9*  --   --   HGB 8.1* 5.9*   < > 7.7* 7.4* 7.1* 8.0* 7.8* 6.4*  HCT 26.1* 19.1*   < > 23.4* 23.1* 22.1* 24.4* 23.0* 20.9*  MCV 92.6 92.7  --  86.3  --   --  88.1  --  95.4  PLT 360 298  --  338  --   --  337  --  298   < > = values in this interval not displayed.    Basic Metabolic Panel: Recent  Labs  Lab 08/19/21 1520 08/19/21 2155 08/20/21 0327 08/21/21 0515 08/21/21 0625 08/22/21 0530  NA 150* 149* 148* 152* 156* 139  K 5.0 4.5 4.6 4.1 4.2 3.5  CL 117* 116* 118* 120*  --  111  CO2 21* 21* 22 23  --  21*  GLUCOSE 139* 195* 258* 179*  --  436*  BUN 123* 119* 114* 96*  --  76*  CREATININE 3.27* 3.00* 2.94* 2.26*  --  1.99*  CALCIUM 9.0 9.1 9.1 9.0  --  8.0*  PHOS  --   --  4.3 4.6  --  5.0*    GFR: Estimated Creatinine Clearance: 52.8 mL/min (A) (by C-G formula based on SCr of 1.99 mg/dL (H)). Liver Function Tests: Recent Labs  Lab 08/16/21 0454 08/17/21 0400 08/18/21 0334 08/19/21 0430 08/20/21 0327 08/21/21 0515 08/22/21 0530  AST 253* 155* 74* 107*  --   --   --   ALT 157* 114* 77* 46*  --   --   --   ALKPHOS 836* 715* 636* 596*  --   --   --   BILITOT 2.3* 2.8* 3.0* 2.4*  --   --   --   PROT 5.5* 5.9* 6.3* 6.1*  --   --   --   ALBUMIN <1.5* <1.5* 2.1* 2.3* 2.2* 2.1* 1.8*    No results for input(s): LIPASE, AMYLASE in the last 168 hours. No results for input(s): AMMONIA in the last 168 hours. Coagulation Profile: No results for input(s): INR, PROTIME in the last 168 hours. Cardiac Enzymes: No results for input(s): CKTOTAL, CKMB, CKMBINDEX, TROPONINI in the last 168 hours.  BNP (last 3 results) No results for input(s): PROBNP in the last 8760 hours. HbA1C: No results for input(s): HGBA1C in the last 72 hours. CBG: Recent Labs  Lab 08/21/21 1518 08/21/21 1919 08/21/21 2325 08/22/21 0312 08/22/21 0735  GLUCAP 83 86 206* 152* 104*    Lipid Profile: No results for input(s): CHOL, HDL, LDLCALC, TRIG, CHOLHDL, LDLDIRECT in the last 72 hours.  Thyroid Function Tests: No results for input(s): TSH, T4TOTAL, FREET4, T3FREE, THYROIDAB in the last 72 hours. Anemia Panel: No results for input(s): VITAMINB12, FOLATE, FERRITIN, TIBC, IRON, RETICCTPCT in the last 72 hours. Sepsis Labs: Recent Labs  Lab 08/19/21 1412  PROCALCITON 0.51     Recent  Results (from the past 240 hour(s))  Culture, Respiratory w Gram Stain     Status: None   Collection Time: 08/13/21  4:30 PM   Specimen: Tracheal Aspirate; Respiratory  Result Value Ref Range Status   Specimen Description TRACHEAL ASPIRATE  Final   Special Requests Normal  Final   Gram Stain   Final    NO SQUAMOUS EPITHELIAL CELLS SEEN FEW WBC SEEN NO ORGANISMS SEEN    Culture   Final    NO GROWTH 2 DAYS Performed  at [facility], [place] [address] [place], [place] [address]    Report Status 08/15/2021 FINAL  Final  Body fluid culture w Gram Stain     Status: None   Collection Time: 08/14/21  8:15 AM   Specimen: Pleural Fluid  Result Value Ref Range Status   Specimen Description FLUID  Final   Special Requests  PLEURAL FLUID  Final   Gram Stain   Final    MODERATE WBC PRESENT, PREDOMINANTLY MONONUCLEAR NO ORGANISMS SEEN    Culture   Final    NO GROWTH 3 DAYS Performed at [facility], [place] [address], [place],  [address]    Report Status 08/17/2021 FINAL  Final       Radiology Studies: DG Abd 1 View  Result Date: 08/21/2021 CLINICAL DATA:  Back pain, free air EXAM: ABDOMEN - 1 VIEW COMPARISON:  Abdominal x-ray 08/14/2021 FINDINGS: Enteric tube tip is in the region of the distal stomach. Bowel gas pattern appears within normal limits. No suspicious calcifications. IMPRESSION: No acute process identified. Electronically Signed   By: Ofilia Neas M.D.   On: 08/21/2021 10:40   NM Pulmonary Perfusion  Result Date: 08/20/2021 CLINICAL DATA:  Recent COVID-19 infection.  Short of breath EXAM: NUCLEAR MEDICINE PERFUSION LUNG SCAN TECHNIQUE: Perfusion images were obtained in multiple projections after intravenous injection of radiopharmaceutical. RADIOPHARMACEUTICALS:  Chest radiograph 08/20/2021 mCi Tc-24m MAA COMPARISON:  None. FINDINGS: Decreased regional perfusion to the LEFT upper lobe which has a more central photopenia than a peripheral photopenia.  Pattern not typical of pulmonary embolism. No perfusion defects in the RIGHT lung. Comparison chest radiograph demonstrates diffuse bilateral airspace disease. IMPRESSION: Large perfusion abnormality in the LEFT upper lobe which has a more regional distribution than segmental. Pattern not typical of pulmonary embolism however cannot exclude pulmonary embolism. Differential would include pulmonary embolism versus pneumonia. Intermediate probability. Electronically Signed   By: Suzy Bouchard M.D.   On: 08/20/2021 16:20   DG CHEST PORT 1 VIEW  Result Date: 08/21/2021 CLINICAL DATA:  Back pain, shortness of breath EXAM: PORTABLE CHEST 1 VIEW COMPARISON:  Chest x-ray 08/20/2021 FINDINGS: Right PICC tip in the SVC. Enteric tube tip below the diaphragm. Cardiomediastinal silhouette appears unchanged. Diffuse prominent interstitial and airspace opacities bilaterally left worse than right with improved aeration of the right lung since previous study. Possible trace left pleural effusion. No pneumothorax visualized. IMPRESSION: 1. Mildly improved aeration of the right lung since previous study. 2. Otherwise no significant change. Electronically Signed   By: Ofilia Neas M.D.   On: 08/21/2021 10:41   DG CHEST PORT 1 VIEW  Result Date: 08/20/2021 CLINICAL DATA:  Nausea and vomiting. EXAM: PORTABLE CHEST 1 VIEW COMPARISON:  Chest x-ray dated August 18, 2021. FINDINGS: Unchanged feeding tube and right upper extremity PICC line. Stable cardiomediastinal silhouette. Similar perihilar opacities throughout the left lung. Unchanged mild right basilar opacity. No pleural effusion or pneumothorax. No acute osseous abnormality. IMPRESSION: 1. Similar left lung pneumonia. 2. Unchanged mild right basilar atelectasis versus infiltrate. Electronically Signed   By: Titus Dubin M.D.   On: 08/20/2021 14:37   VAS [place] LOWER EXTREMITY VENOUS (DVT)  Result Date: 08/21/2021  Lower Venous DVT Study Patient Name:  JUVENCIO VERDI  Date of Exam:   08/21/2021 Medical Rec #: [id number]        Accession #:    [id number] Date of Birth: 10/17/1976        Patient Gender: M Patient Age:  45 years Exam Location:  [facility] Procedure:      VAS [place] LOWER EXTREMITY VENOUS (DVT) Referring Phys: Kendall Justo --------------------------------------------------------------------------------  Indications: Swelling, and Edema.  Comparison Study: no prior Performing Technologist: Archie Patten RVS  Examination Guidelines: A complete evaluation includes B-mode imaging, spectral Doppler, color Doppler, and power Doppler as needed of all accessible portions of each vessel. Bilateral testing is considered an integral part of a complete examination. Limited examinations for reoccurring indications may be performed as noted. The reflux portion of the exam is performed with the patient in reverse Trendelenburg.  +---------+---------------+---------+-----------+----------+--------------+ RIGHT    CompressibilityPhasicitySpontaneityPropertiesThrombus Aging +---------+---------------+---------+-----------+----------+--------------+ CFV      Full           Yes      Yes                                 +---------+---------------+---------+-----------+----------+--------------+ SFJ      Full                                                        +---------+---------------+---------+-----------+----------+--------------+ FV Prox  Full                                                        +---------+---------------+---------+-----------+----------+--------------+ FV Mid   Full                                                        +---------+---------------+---------+-----------+----------+--------------+ FV DistalFull                                                        +---------+---------------+---------+-----------+----------+--------------+ PFV      Full                                                         +---------+---------------+---------+-----------+----------+--------------+ POP      Full           Yes      Yes                                 +---------+---------------+---------+-----------+----------+--------------+ PTV      Full                                                        +---------+---------------+---------+-----------+----------+--------------+ PERO     Full                                                        +---------+---------------+---------+-----------+----------+--------------+   +---------+---------------+---------+-----------+----------+--------------+  LEFT     CompressibilityPhasicitySpontaneityPropertiesThrombus Aging +---------+---------------+---------+-----------+----------+--------------+ CFV      Full           Yes      Yes                                 +---------+---------------+---------+-----------+----------+--------------+ SFJ      Full                                                        +---------+---------------+---------+-----------+----------+--------------+ FV Prox  Full                                                        +---------+---------------+---------+-----------+----------+--------------+ FV Mid   Full                                                        +---------+---------------+---------+-----------+----------+--------------+ FV DistalFull                                                        +---------+---------------+---------+-----------+----------+--------------+ PFV      Full                                                        +---------+---------------+---------+-----------+----------+--------------+ POP      Full           Yes      Yes                                 +---------+---------------+---------+-----------+----------+--------------+ PTV      Full                                                         +---------+---------------+---------+-----------+----------+--------------+ PERO     Full                                                        +---------+---------------+---------+-----------+----------+--------------+     Summary: BILATERAL: - No evidence of deep vein thrombosis seen in the lower extremities, bilaterally. -No evidence of popliteal cyst, bilaterally.   *See table(s) above for measurements and observations. Electronically signed by Jamelle Haring on 08/21/2021 at 4:59:21 PM.  Final    VAS [place] UPPER EXTREMITY VENOUS DUPLEX  Result Date: 08/21/2021 UPPER VENOUS STUDY  Patient Name:  AZARIA BARTELL  Date of Exam:   08/21/2021 Medical Rec #: [id number]        Accession #:    [id number] Date of Birth: [birth year]        Patient Gender: M Patient Age:   69 years Exam Location:  [facility] Procedure:      VAS [place] UPPER EXTREMITY VENOUS DUPLEX Referring Phys: Iisha Soyars --------------------------------------------------------------------------------  Indications: Edema Comparison Study: no prior Performing Technologist: Archie Patten RVS  Examination Guidelines: A complete evaluation includes B-mode imaging, spectral Doppler, color Doppler, and power Doppler as needed of all accessible portions of each vessel. Bilateral testing is considered an integral part of a complete examination. Limited examinations for reoccurring indications may be performed as noted.  Right Findings: +----------+------------+---------+-----------+----------+-------+ RIGHT     CompressiblePhasicitySpontaneousPropertiesSummary +----------+------------+---------+-----------+----------+-------+ Subclavian    Full       Yes       Yes                      +----------+------------+---------+-----------+----------+-------+  Left Findings: +----------+------------+---------+-----------+----------+-------+ LEFT      CompressiblePhasicitySpontaneousPropertiesSummary  +----------+------------+---------+-----------+----------+-------+ IJV           Full       Yes       Yes                      +----------+------------+---------+-----------+----------+-------+ Subclavian    Full       Yes       Yes                      +----------+------------+---------+-----------+----------+-------+ Axillary      Full       Yes       Yes                      +----------+------------+---------+-----------+----------+-------+ Brachial      Full       Yes       Yes                      +----------+------------+---------+-----------+----------+-------+ Radial        Full                                          +----------+------------+---------+-----------+----------+-------+ Ulnar         Full                                          +----------+------------+---------+-----------+----------+-------+ Cephalic      Full                                          +----------+------------+---------+-----------+----------+-------+ Basilic       Full                                          +----------+------------+---------+-----------+----------+-------+  Summary:  Right: No evidence of thrombosis in  the subclavian.  Left: No evidence of deep vein thrombosis in the upper extremity. No evidence of superficial vein thrombosis in the upper extremity.  *See table(s) above for measurements and observations.  Diagnosing physician: Jamelle Haring Electronically signed by Jamelle Haring on 08/21/2021 at 5:05:00 PM.    Final     Scheduled Meds:  sodium chloride   Intravenous Once   sodium chloride   Intravenous Once   sodium chloride   Intravenous Once   amLODipine  5 mg Per NG tube Daily   Chlorhexidine Gluconate Cloth  6 each Topical Daily   collagenase   Topical Daily   darbepoetin (ARANESP) injection - NON-DIALYSIS  150 mcg Subcutaneous Q Mon-1800   feeding supplement  237 mL Oral TID BM   feeding supplement (PROSource TF)  45 mL Per Tube BID    gabapentin  200 mg Per Tube Q12H   insulin aspart  0-20 Units Subcutaneous Q4H   insulin aspart  8 Units Subcutaneous Q4H   insulin glargine-yfgn  10 Units Subcutaneous BID   lidocaine  2 patch Transdermal Q24H   living well with diabetes book   Does not apply Once   LORazepam  0.5 mg Intravenous Q6H   mouth rinse  15 mL Mouth Rinse BID   melatonin  3 mg Oral QHS   metoprolol tartrate  25 mg Per NG tube BID   mupirocin ointment  1 application Topical BID   [START ON 08/24/2021] pantoprazole  40 mg Intravenous Q12H   sodium chloride flush  10-40 mL Intracatheter Q12H   sodium chloride flush  10-40 mL Intracatheter Q12H   sodium chloride flush  3 mL Intravenous Q12H   sucralfate  1 g Per Tube TID WC & HS   traZODone  100 mg Per Tube QHS   venlafaxine  37.5 mg Per Tube Daily   Continuous Infusions:  sodium chloride 10 mL/hr at 08/22/21 0700    ceFAZolin (ANCEF) IV Stopped (08/22/21 0553)   feeding supplement (VITAL 1.5 CAL) Stopped (08/21/21 0837)   fluconazole (DIFLUCAN) IV Stopped (08/21/21 1305)   pantoprazole 8 mg/hr (08/22/21 0700)   promethazine (PHENERGAN) injection (IM or IVPB) 25 mg (08/10/21 0506)     LOS: 16 days   Time spent: 32 minutes   Darliss Cheney, MD [facility]  08/22/2021, 10:30 AM  Please page via Amion and do not message via secure chat for anything urgent. Secure chat can be used for anything non urgent.  How to contact the [facility] Attending or Consulting provider [place] or covering provider during after hours [place], for this patient?  Check the care team in [facility] and look for a) attending/consulting [facility] provider listed and b) the [facility] team listed. Page or secure chat [phone]. Log into [email or web address] and use [place]'s universal password to access. If you do not have the password, please contact the hospital operator. Locate the [facility] provider you are looking for under [facility] and page to a number that you can be directly reached. If you still  have difficulty reaching the provider, please page the [facility] (Director on Call) for the Hospitalists listed on amion for assistance.

## 2021-08-22 NOTE — Progress Notes (Signed)
Subjective:   No further blood BMs or bleeding noted.  He says he feels a bit better this AM but is still having back pain.  UOP 1.2L yesterday.  BUN/Cr improved from 96/2.26 to 77/1.99.    Objective Vital signs in last 24 hours: Vitals:   08/22/21 0400 08/22/21 0500 08/22/21 0600 08/22/21 0736  BP: (!) 156/82 (!) 147/77 (!) 148/78   Pulse: (!) 103 98 (!) 104   Resp: (!) 26 19 (!) 21   Temp:    99.3 F (37.4 C)  TempSrc:    Oral  SpO2: 95% 97% 96%   Weight:  90.6 kg    Height:       Weight change: -7.2 kg  Intake/Output Summary (Last 24 hours) at 08/22/2021 1937 Last data filed at 08/22/2021 0700 Gross per 24 hour  Intake 2709.73 ml  Output 1225 ml  Net 1484.73 ml     Assessment/ Plan: Pt is a 45 y.o. yo male CKD ( crt 1.5-2) , DM, HTN who was admitted on 08/06/2021 with MSSA bacteremia/osteomyelitis- s/p T4/5 laminectomy, pediculetomy and spinal cord decompression-  complicating A on CRF Assessment/Plan: 1. 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   Now with A on CRF in the setting of above-  non oliguric.   crt solidly trending down and he's essentially back to baseline. Hasn't required RRT.  Now has condom cath and reports no voiding issues.  Cont to follow I/Os, dose meds for CrCl.  2. HTN/vol-  overloaded but UOP improving on own, no indication for diuresis today 3. Anemia-  Hb 7.8 last check - no report of further GIB.  Required last transfusion 10/28.  Per primary.   4. Hyperkalemia-  currently K ok - cont to follow closely.  5. Hypernatremia-  improved, d/c D5W today.  D/w RN - not getting FWF currently; would hold today even if TF resumed.   Will continue to follow.   Jose [name]    Labs: Basic Metabolic Panel: Recent Labs  Lab 08/20/21 0327 08/21/21 0515 08/21/21 0625 08/22/21 0530  NA 148* 152* 156* 139  K 4.6 4.1 4.2 3.5  CL 118* 120*  --  111  CO2 22 23  --  21*  GLUCOSE 258* 179*  --  436*  BUN 114* 96*  --  76*  CREATININE  2.94* 2.26*  --  1.99*  CALCIUM 9.1 9.0  --  8.0*  PHOS 4.3 4.6  --  5.0*    Liver Function Tests: Recent Labs  Lab 08/17/21 0400 08/18/21 0334 08/19/21 0430 08/20/21 0327 08/21/21 0515 08/22/21 0530  AST 155* 74* 107*  --   --   --   ALT 114* 77* 46*  --   --   --   ALKPHOS 715* 636* 596*  --   --   --   BILITOT 2.8* 3.0* 2.4*  --   --   --   PROT 5.9* 6.3* 6.1*  --   --   --   ALBUMIN <1.5* 2.1* 2.3* 2.2* 2.1* 1.8*    No results for input(s): LIPASE, AMYLASE in the last 168 hours. No results for input(s): AMMONIA in the last 168 hours. CBC: Recent Labs  Lab 08/17/21 0400 08/18/21 0334 08/19/21 0430 08/19/21 0520 08/20/21 0327 08/20/21 1321 08/20/21 2318 08/21/21 0515 08/21/21 0625  WBC 21.4* 21.7* 20.1*  --  21.1*  --   --  23.7*  --   NEUTROABS  --   --   --   --  16.1*  --   --  18.9*  --   HGB 8.3* 8.1* 5.9*   < > 7.7*   < > 7.1* 8.0* 7.8*  HCT 26.5* 26.1* 19.1*   < > 23.4*   < > 22.1* 24.4* 23.0*  MCV 90.8 92.6 92.7  --  86.3  --   --  88.1  --   PLT 352 360 298  --  338  --   --  337  --    < > = values in this interval not displayed.    Cardiac Enzymes: No results for input(s): CKTOTAL, CKMB, CKMBINDEX, TROPONINI in the last 168 hours.  CBG: Recent Labs  Lab 08/21/21 1518 08/21/21 1919 08/21/21 2325 08/22/21 0312 08/22/21 0735  GLUCAP 83 86 206* 152* 104*     Iron Studies: No results for input(s): IRON, TIBC, TRANSFERRIN, FERRITIN in the last 72 hours. Studies/Results: DG Abd 1 View  Result Date: 08/21/2021 CLINICAL DATA:  Back pain, free air EXAM: ABDOMEN - 1 VIEW COMPARISON:  Abdominal x-ray 08/14/2021 FINDINGS: Enteric tube tip is in the region of the distal stomach. Bowel gas pattern appears within normal limits. No suspicious calcifications. IMPRESSION: No acute process identified. Electronically Signed   By: Ofilia Neas M.D.   On: 08/21/2021 10:40   NM Pulmonary Perfusion  Result Date: 08/20/2021 CLINICAL DATA:  Recent COVID-19  infection.  Short of breath EXAM: NUCLEAR MEDICINE PERFUSION LUNG SCAN TECHNIQUE: Perfusion images were obtained in multiple projections after intravenous injection of radiopharmaceutical. RADIOPHARMACEUTICALS:  Chest radiograph 08/20/2021 mCi Tc-41m MAA COMPARISON:  None. FINDINGS: Decreased regional perfusion to the LEFT upper lobe which has a more central photopenia than a peripheral photopenia. Pattern not typical of pulmonary embolism. No perfusion defects in the RIGHT lung. Comparison chest radiograph demonstrates diffuse bilateral airspace disease. IMPRESSION: Large perfusion abnormality in the LEFT upper lobe which has a more regional distribution than segmental. Pattern not typical of pulmonary embolism however cannot exclude pulmonary embolism. Differential would include pulmonary embolism versus pneumonia. Intermediate probability. Electronically Signed   By: Suzy Bouchard M.D.   On: 08/20/2021 16:20   DG CHEST PORT 1 VIEW  Result Date: 08/21/2021 CLINICAL DATA:  Back pain, shortness of breath EXAM: PORTABLE CHEST 1 VIEW COMPARISON:  Chest x-ray 08/20/2021 FINDINGS: Right PICC tip in the SVC. Enteric tube tip below the diaphragm. Cardiomediastinal silhouette appears unchanged. Diffuse prominent interstitial and airspace opacities bilaterally left worse than right with improved aeration of the right lung since previous study. Possible trace left pleural effusion. No pneumothorax visualized. IMPRESSION: 1. Mildly improved aeration of the right lung since previous study. 2. Otherwise no significant change. Electronically Signed   By: Ofilia Neas M.D.   On: 08/21/2021 10:41   DG CHEST PORT 1 VIEW  Result Date: 08/20/2021 CLINICAL DATA:  Nausea and vomiting. EXAM: PORTABLE CHEST 1 VIEW COMPARISON:  Chest x-ray dated August 18, 2021. FINDINGS: Unchanged feeding tube and right upper extremity PICC line. Stable cardiomediastinal silhouette. Similar perihilar opacities throughout the left lung.  Unchanged mild right basilar opacity. No pleural effusion or pneumothorax. No acute osseous abnormality. IMPRESSION: 1. Similar left lung pneumonia. 2. Unchanged mild right basilar atelectasis versus infiltrate. Electronically Signed   By: Titus Dubin M.D.   On: 08/20/2021 14:37   VAS [place] LOWER EXTREMITY VENOUS (DVT)  Result Date: 08/21/2021  Lower Venous DVT Study Patient Name:  Jose [name]  Date of Exam:   08/21/2021 Medical Rec #: [id number]  Accession #:    [id number] Date of Birth: 10/03/1976        Patient Gender: M Patient Age:   33 years Exam Location:  [facility] Procedure:      VAS [place] LOWER EXTREMITY VENOUS (DVT) Referring Phys: RAVI PAHWANI --------------------------------------------------------------------------------  Indications: Swelling, and Edema.  Comparison Study: no prior Performing Technologist: Archie Patten RVS  Examination Guidelines: A complete evaluation includes B-mode imaging, spectral Doppler, color Doppler, and power Doppler as needed of all accessible portions of each vessel. Bilateral testing is considered an integral part of a complete examination. Limited examinations for reoccurring indications may be performed as noted. The reflux portion of the exam is performed with the patient in reverse Trendelenburg.  +---------+---------------+---------+-----------+----------+--------------+ RIGHT    CompressibilityPhasicitySpontaneityPropertiesThrombus Aging +---------+---------------+---------+-----------+----------+--------------+ CFV      Full           Yes      Yes                                 +---------+---------------+---------+-----------+----------+--------------+ SFJ      Full                                                        +---------+---------------+---------+-----------+----------+--------------+ FV Prox  Full                                                         +---------+---------------+---------+-----------+----------+--------------+ FV Mid   Full                                                        +---------+---------------+---------+-----------+----------+--------------+ FV DistalFull                                                        +---------+---------------+---------+-----------+----------+--------------+ PFV      Full                                                        +---------+---------------+---------+-----------+----------+--------------+ POP      Full           Yes      Yes                                 +---------+---------------+---------+-----------+----------+--------------+ PTV      Full                                                        +---------+---------------+---------+-----------+----------+--------------+  PERO     Full                                                        +---------+---------------+---------+-----------+----------+--------------+   +---------+---------------+---------+-----------+----------+--------------+ LEFT     CompressibilityPhasicitySpontaneityPropertiesThrombus Aging +---------+---------------+---------+-----------+----------+--------------+ CFV      Full           Yes      Yes                                 +---------+---------------+---------+-----------+----------+--------------+ SFJ      Full                                                        +---------+---------------+---------+-----------+----------+--------------+ FV Prox  Full                                                        +---------+---------------+---------+-----------+----------+--------------+ FV Mid   Full                                                        +---------+---------------+---------+-----------+----------+--------------+ FV DistalFull                                                         +---------+---------------+---------+-----------+----------+--------------+ PFV      Full                                                        +---------+---------------+---------+-----------+----------+--------------+ POP      Full           Yes      Yes                                 +---------+---------------+---------+-----------+----------+--------------+ PTV      Full                                                        +---------+---------------+---------+-----------+----------+--------------+ PERO     Full                                                        +---------+---------------+---------+-----------+----------+--------------+  Summary: BILATERAL: - No evidence of deep vein thrombosis seen in the lower extremities, bilaterally. -No evidence of popliteal cyst, bilaterally.   *See table(s) above for measurements and observations. Electronically signed by Jamelle Haring on 08/21/2021 at 4:59:21 PM.    Final    VAS [place] UPPER EXTREMITY VENOUS DUPLEX  Result Date: 08/21/2021 UPPER VENOUS STUDY  Patient Name:  Jose [name]  Date of Exam:   08/21/2021 Medical Rec #: [id number]        Accession #:    [id number] Date of Birth: 1976-09-22        Patient Gender: M Patient Age:   57 years Exam [facility]  [facility] Procedure:      VAS [place] UPPER EXTREMITY VENOUS DUPLEX Referring Phys: RAVI PAHWANI --------------------------------------------------------------------------------  Indications: Edema Comparison Study: no prior Performing Technologist: Archie Patten RVS  Examination Guidelines: A complete evaluation includes B-mode imaging, spectral Doppler, color Doppler, and power Doppler as needed of all accessible portions of each vessel. Bilateral testing is considered an integral part of a complete examination. Limited examinations for reoccurring indications may be performed as noted.  Right Findings:  +----------+------------+---------+-----------+----------+-------+ RIGHT     CompressiblePhasicitySpontaneousPropertiesSummary +----------+------------+---------+-----------+----------+-------+ Subclavian    Full       Yes       Yes                      +----------+------------+---------+-----------+----------+-------+  Left Findings: +----------+------------+---------+-----------+----------+-------+ LEFT      CompressiblePhasicitySpontaneousPropertiesSummary +----------+------------+---------+-----------+----------+-------+ IJV           Full       Yes       Yes                      +----------+------------+---------+-----------+----------+-------+ Subclavian    Full       Yes       Yes                      +----------+------------+---------+-----------+----------+-------+ Axillary      Full       Yes       Yes                      +----------+------------+---------+-----------+----------+-------+ Brachial      Full       Yes       Yes                      +----------+------------+---------+-----------+----------+-------+ Radial        Full                                          +----------+------------+---------+-----------+----------+-------+ Ulnar         Full                                          +----------+------------+---------+-----------+----------+-------+ Cephalic      Full                                          +----------+------------+---------+-----------+----------+-------+ Basilic       Full                                          +----------+------------+---------+-----------+----------+-------+  Summary:  Right: No evidence of thrombosis in the subclavian.  Left: No evidence of deep vein thrombosis in the upper extremity. No evidence of superficial vein thrombosis in the upper extremity.  *See table(s) above for measurements and observations.  Diagnosing physician: Jamelle Haring Electronically signed by Jamelle Haring on 08/21/2021 at 5:05:00 PM.    Final    Medications: Infusions:  sodium chloride 10 mL/hr at 08/22/21 0700    ceFAZolin (ANCEF) IV Stopped (08/22/21 0553)   feeding supplement (VITAL 1.5 CAL) Stopped (08/21/21 8546)   fluconazole (DIFLUCAN) IV Stopped (08/21/21 1305)   pantoprazole 8 mg/hr (08/22/21 0700)   promethazine (PHENERGAN) injection (IM or IVPB) 25 mg (08/10/21 0506)    Scheduled Medications:  sodium chloride   Intravenous Once   sodium chloride   Intravenous Once   amLODipine  5 mg Per NG tube Daily   Chlorhexidine Gluconate Cloth  6 each Topical Daily   collagenase   Topical Daily   darbepoetin (ARANESP) injection - NON-DIALYSIS  150 mcg Subcutaneous Q Mon-1800   feeding supplement  237 mL Oral TID BM   feeding supplement (PROSource TF)  45 mL Per Tube BID   gabapentin  200 mg Per Tube Q12H   insulin aspart  0-20 Units Subcutaneous Q4H   insulin aspart  8 Units Subcutaneous Q4H   insulin glargine-yfgn  10 Units Subcutaneous BID   lidocaine  2 patch Transdermal Q24H   living well with diabetes book   Does not apply Once   LORazepam  0.5 mg Intravenous Q6H   mouth rinse  15 mL Mouth Rinse BID   melatonin  3 mg Oral QHS   metoprolol tartrate  25 mg Per NG tube BID   mupirocin ointment  1 application Topical BID   [START ON 08/24/2021] pantoprazole  40 mg Intravenous Q12H   sodium chloride flush  10-40 mL Intracatheter Q12H   sodium chloride flush  10-40 mL Intracatheter Q12H   sodium chloride flush  3 mL Intravenous Q12H   sucralfate  1 g Per Tube TID WC & HS   traZODone  100 mg Per Tube QHS   venlafaxine  37.5 mg Per Tube Daily    have reviewed scheduled and prn medications.  Physical Exam: General:  lying on side in no distress but seems somewhat lethargic after pain meds Heart: RRR Lungs: Deep sighing respirations but clear Abdomen: soft, no TTP  Extremities: 1+ pitting edema    08/22/2021,9:27 AM  LOS: 16 days

## 2021-08-22 NOTE — Progress Notes (Addendum)
MD notified of new patient nausea and restlessness.  MD notified of drop in hgb to 6.4 - see new orders  GI paged and notified. Discussed yesterdays potential plans for a scope today, per GI restart tube feeds at goal and continue PO meds   1530- GI MD and TRH notified of large, dark tarry Bmx2 , per GI MD hold TF at midnight for potential procedure tomorrow.   1530- repeat hgb drawn 7.3 * mistimed in chart for 0837- Lab called to correct this

## 2021-08-23 ENCOUNTER — Inpatient Hospital Stay (HOSPITAL_COMMUNITY): Payer: 59

## 2021-08-23 DIAGNOSIS — M4624 Osteomyelitis of vertebra, thoracic region: Secondary | ICD-10-CM | POA: Diagnosis not present

## 2021-08-23 LAB — GLUCOSE, CAPILLARY
Glucose-Capillary: 99 mg/dL (ref 70–99)
Glucose-Capillary: 54 mg/dL — ABNORMAL LOW (ref 70–99)
Glucose-Capillary: 94 mg/dL (ref 70–99)
Glucose-Capillary: 86 mg/dL (ref 70–99)
Glucose-Capillary: 100 mg/dL — ABNORMAL HIGH (ref 70–99)
Glucose-Capillary: 143 mg/dL — ABNORMAL HIGH (ref 70–99)
Glucose-Capillary: 222 mg/dL — ABNORMAL HIGH (ref 70–99)

## 2021-08-23 LAB — BPAM RBC
Blood Product Expiration Date: 202211052359
Blood Product Expiration Date: 202211052359
Blood Product Expiration Date: 202211132359
Blood Product Expiration Date: 202211172359
Blood Product Expiration Date: 202211182359
Blood Product Expiration Date: 202211182359
ISSUE DATE / TIME: 202210260728
ISSUE DATE / TIME: 202210261001
ISSUE DATE / TIME: 202210280134
ISSUE DATE / TIME: 202210281823
ISSUE DATE / TIME: 202210281823
ISSUE DATE / TIME: 202210291057
Unit Type and Rh: 6200
Unit Type and Rh: 6200
Unit Type and Rh: 6200
Unit Type and Rh: 6200
Unit Type and Rh: 6200
Unit Type and Rh: 6200

## 2021-08-23 LAB — TYPE AND SCREEN
ABO/RH(D): A POS
Antibody Screen: NEGATIVE
Unit division: 0
Unit division: 0
Unit division: 0
Unit division: 0
Unit division: 0
Unit division: 0

## 2021-08-23 LAB — PREPARE RBC (CROSSMATCH)

## 2021-08-23 LAB — OCCULT BLOOD X 1 CARD TO LAB, STOOL: Fecal Occult Bld: POSITIVE — AB

## 2021-08-23 LAB — CBC
HCT: 21.1 % — ABNORMAL LOW (ref 39.0–52.0)
HCT: 26.9 % — ABNORMAL LOW (ref 39.0–52.0)
Hemoglobin: 6.6 g/dL — CL (ref 13.0–17.0)
Hemoglobin: 8.5 g/dL — ABNORMAL LOW (ref 13.0–17.0)
MCH: 29.7 pg (ref 26.0–34.0)
MCH: 28.6 pg (ref 26.0–34.0)
MCHC: 31.3 g/dL (ref 30.0–36.0)
MCHC: 31.6 g/dL (ref 30.0–36.0)
MCV: 95 fL (ref 80.0–100.0)
MCV: 90.6 fL (ref 80.0–100.0)
Platelets: 275 10*3/uL (ref 150–400)
Platelets: 306 10*3/uL (ref 150–400)
RBC: 2.22 MIL/uL — ABNORMAL LOW (ref 4.22–5.81)
RBC: 2.97 MIL/uL — ABNORMAL LOW (ref 4.22–5.81)
RDW: 17.7 % — ABNORMAL HIGH (ref 11.5–15.5)
RDW: 18.2 % — ABNORMAL HIGH (ref 11.5–15.5)
WBC: 18.8 10*3/uL — ABNORMAL HIGH (ref 4.0–10.5)
WBC: 22.5 10*3/uL — ABNORMAL HIGH (ref 4.0–10.5)
nRBC: 0 % (ref 0.0–0.2)
nRBC: 0 % (ref 0.0–0.2)

## 2021-08-23 LAB — RENAL FUNCTION PANEL
Albumin: 1.8 g/dL — ABNORMAL LOW (ref 3.5–5.0)
Anion gap: 7 (ref 5–15)
BUN: 81 mg/dL — ABNORMAL HIGH (ref 6–20)
CO2: 22 mmol/L (ref 22–32)
Calcium: 8.2 mg/dL — ABNORMAL LOW (ref 8.9–10.3)
Chloride: 120 mmol/L — ABNORMAL HIGH (ref 98–111)
Creatinine, Ser: 2.21 mg/dL — ABNORMAL HIGH (ref 0.61–1.24)
GFR, Estimated: 36 mL/min — ABNORMAL LOW (ref >60)
Glucose, Bld: 78 mg/dL (ref 70–99)
Phosphorus: 5.7 mg/dL — ABNORMAL HIGH (ref 2.5–4.6)
Potassium: 4.3 mmol/L (ref 3.5–5.1)
Sodium: 149 mmol/L — ABNORMAL HIGH (ref 135–145)

## 2021-08-23 IMAGING — DX DG CHEST 1V PORT
1 series · 1 of 1 positions shown · non-contrast
Comparison: Chest x-ray [DATE]

CLINICAL DATA: Chest pain. History of discitis and osteomyelitis in
the thoracic spine.

EXAM:
PORTABLE CHEST 1 VIEW

[chest]
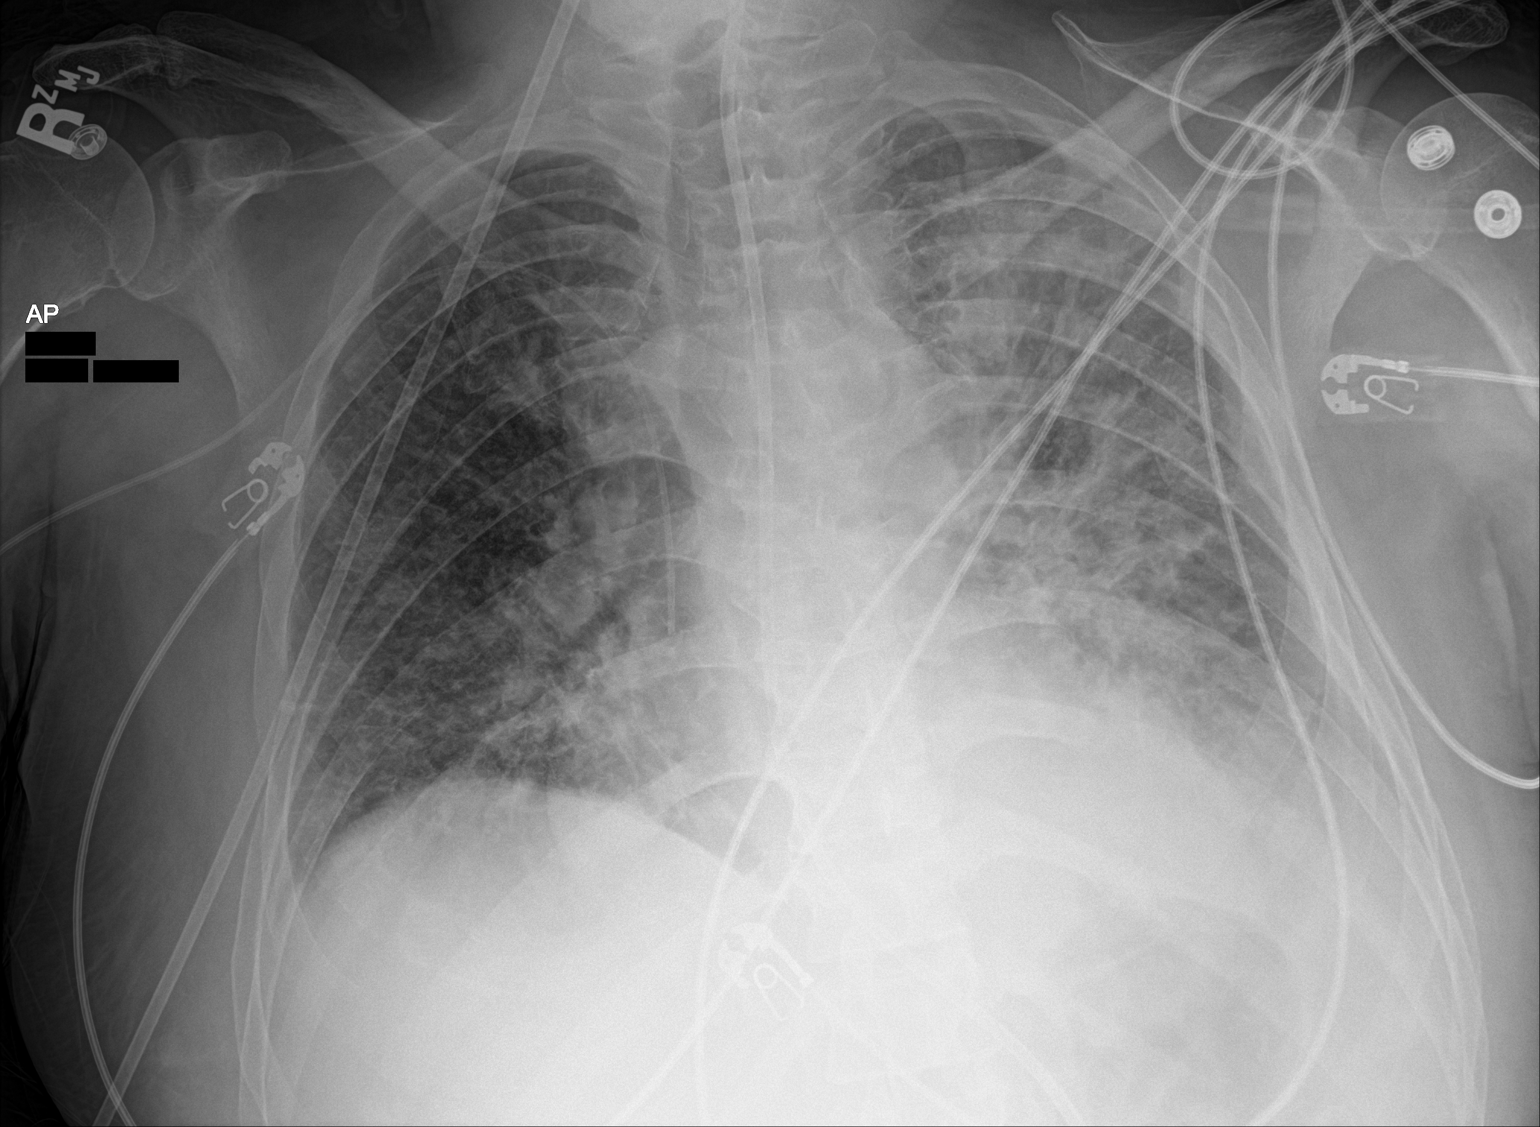

[1 of 1 positions shown; findings below may reference images not displayed]

FINDINGS: The right-sided PICC line is stable.  The feeding tube is stable.

Persistent diffuse but asymmetric interstitial and airspace process
in the lungs. No pleural effusions or pneumothorax.
IMPRESSION: Persistent diffuse but asymmetric interstitial and airspace process.

## 2021-08-23 MED ORDER — SODIUM CHLORIDE 0.9% IV SOLUTION: Freq: Once | INTRAVENOUS | Status: AC

## 2021-08-23 MED ORDER — SODIUM CHLORIDE 0.9% IV SOLUTION: Freq: Once | INTRAVENOUS | Status: DC

## 2021-08-23 MED ORDER — DEXTROSE 50 % IV SOLN
25.0000 mL | Freq: Once | INTRAVENOUS | Status: AC
  Administered 2021-08-23: 25 mL via INTRAVENOUS

## 2021-08-23 MED ORDER — DEXTROSE 50 % IV SOLN
INTRAVENOUS | Status: AC
  Filled 2021-08-23: qty 50

## 2021-08-23 MED ORDER — DEXTROSE 50 % IV SOLN: 1.0000 | Freq: Once | INTRAVENOUS | Status: DC

## 2021-08-23 NOTE — Progress Notes (Addendum)
[facility] Progress Note Patient Name: Jose [name] DOB: 07/10/1976 MRN: [id number]   Date of Service  08/23/2021  HPI/Events of Note  Patient with dark, tarry stools per rectum, he also dropped his hemoglobin to 6.6 gm / dl.  eICU Interventions  Two units PRBC will be ordered transfused, bedside RN requested to notify GI of the drop in hemoglobin to 6.6 gm / dl and dark tarry stools. PT, INR ordered as there is no record of recent coagulation parameters in the chart.        Kerry Kass Kirt Chew 08/23/2021, 5:27 AM

## 2021-08-23 NOTE — Progress Notes (Signed)
Jose [name] 10:33 AM  Subjective: Patient seen and examined and discussed with nurse and he is doing even better today than yesterday and we discussed his endoscopy and he has not had a bowel movement today and has no GI complaints Stools yesterday not as dark as they have been Objective: Vital signs stable afebrile no acute distress abdomen is soft nontender BUN and creatinine slight increase hemoglobin decreased white count decreased as well  Assessment: Multiple medical problems including some melena  Plan: Will proceed with repeat endoscopy tomorrow and was discussed with the patient and the nurse and please call me sooner if GI question or problem today  [facility]  office [phone] After 5PM or if no answer call [phone]

## 2021-08-23 NOTE — Progress Notes (Signed)
0800-  -TRH MD paged for low blood sugar - see new orders. Per MD hold AM insulin   - GI MD, Magod, paged for unstable hgb and recurrent dark tarry stools. Discussed plan of care, no EGD planned for today- possibly tomorrow. Per MD hold BP PO medications this morning- will discuss restarted TF on rounds.

## 2021-08-23 NOTE — Progress Notes (Signed)
eLink Physician-Brief Progress Note Patient Name: Jose [name] DOB: 10-Jan-1976 MRN: [id number]   Date of Service  08/23/2021  HPI/Events of Note  E-Link RN notified me that PCCM signed off patient previously.  eICU Interventions  Blood transfusion orders discontinued.        Kerry Kass Siriyah Ambrosius 08/23/2021, 5:40 AM

## 2021-08-23 NOTE — H&P (View-Only) (Signed)
Jose [name] 10:33 AM  Subjective: Patient seen and examined and discussed with nurse and he is doing even better today than yesterday and we discussed his endoscopy and he has not had a bowel movement today and has no GI complaints Stools yesterday not as dark as they have been Objective: Vital signs stable afebrile no acute distress abdomen is soft nontender BUN and creatinine slight increase hemoglobin decreased white count decreased as well  Assessment: Multiple medical problems including some melena  Plan: Will proceed with repeat endoscopy tomorrow and was discussed with the patient and the nurse and please call me sooner if GI question or problem today  [facility]  office [phone] After 5PM or if no answer call [phone]

## 2021-08-23 NOTE — Significant Event (Signed)
Ordered 2u PRBC Transfusion.

## 2021-08-23 NOTE — Progress Notes (Signed)
PROGRESS NOTE    Jose [name]  KZS:[id number] DOB: [birth year] DOA: 08/06/2021 PCP: [name], NP   Brief Narrative:  Jose [name] is a 45 y.o. with a pertinent PMH of GAD, HTN, HLD, macular degeneration, T2DM, CKD (Stage 3), tobacco use disorder, hx of pleural effusion, recent admission for MSSA bacteremia/osteomyelitis who was discharged home with IV antibiotics on 08/03/2021, returned to the ED on 08/07/2021 with worsening back pain and admitted under [facility], now s/p T4-T5 laminectomy, left T5 pediculotomy, and spinal cord decompression. On 10/17 patient developed coffee-ground emesis, s/p EGD showing circumferential esophagitis with hemorrhage and non-bleeding duodenal ulcers.On 10/17, pccm was consulted for acute hypoxia.    Significant Events: 10/13 -admitted with worsening thoracic osteomyelitis, constipation/abd distention, neurosurgery consulted  10/14 - T4-T5 laminectomy, left T5 pediculotomy, and spinal cord decompression 10/17 - Hgb drop to 6.2, coffee ground emesis s/p EGD with esophagitis and peptic ulcer 10/17 - hypoxia and pccm consult  10/19 intubated 10/21 developed worsening kidney function, nephro consulted.  10/24 extubated Transferred under [place] on 08/18/2021.  Assessment & Plan:   Principal Problem:   Osteomyelitis of thoracic spine (HCC) Active Problems:   Type 2 diabetes mellitus with diabetic polyneuropathy, without long-term current use of insulin (HCC)   Hyperlipidemia   GAD (generalized anxiety disorder)   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)   Essential hypertension   CKD (chronic kidney disease) stage 3, GFR 30-59 ml/min (HCC)   Pleural effusion, bilateral  Acute hypoxic respiratory failure/aspiration pneumonia, pneumonitis/MSSA bacteremia: Patient required intubation on 08/12/2021, extubated on 08/17/2021, oxygenation improved, currently on 2 L and maintaining oxygen saturation at 96%.  He recently completed course of Unasyn.  Chest x-ray  08/19/2019 shows persistent left lung pneumonia and worsening of atelectasis and consolidation at lung bases.  Procalcitonin improved though.  Antibiotics were switched to Unasyn but with improved procalcitonin, concern for aspiration pneumonia was very low and after discussing with pharmacy, we switched back to cefazolin since that has better coverage for bacteremia.  Complains of some shortness of breath and has some rhonchi today.  We will repeat chest x-ray today.  Acute blood loss anemia/upper GI bleed/candidal esophagitis and PUD s/p EGD/possible PE: Patient had acute drop in hemoglobin from 8.1-5.9 within 24 hours on the morning of 08/19/2021, received 2 units of PRBC transfusion.  Hemoglobin improved to 7.7 but then dropped to 7.1 again early morning and he had a large melanotic stool so 1 unit of PRBC transfusion was ordered by night hospitalist. I consulted GI again on 08/21/2021.  They stopped his tube feedings for possible plan for EGD on 08/22/2021 and this plan was canceled despite of patient's hemoglobin dropping to 6.4.  Patient was given 1 unit of PRBC transfusion.  Patient's hemoglobin improved to 7.3 but dropped again to 6.6.  Nursing reports that that patient has had multiple melanotic BMs last night, at least 4 and they are all documented in the chart.  His hemoglobin once again dropped to 6.6.  GI was notified by nurses yet once again, per GI note today, endoscopy is deferred until tomorrow.  We will continue patient on Protonix drip and I defer complete management of GI bleed to gastroenterology consultant.  Also his VQ scan cannot rule out PE on the left side, intermediate probability.  Doppler lower extremity and left upper extremity negative for any DVT.  PE cannot be ruled out completely but unfortunately due to recurrent acute blood loss anemia, we cannot initiate him on any anticoagulation.  Hopefully once his renal function improves, we will do CT angiogram of chest for definitive  study but with negative DVT, chances of PE are much less now.   ATN on top of acute on chronic kidney disease stage IIIa: baseline creatinine of 1.5-2.0.  Creatinine peaked at 4.5 on 08/17/2021, creatinine improved to 1.99 yesterday and jumped to 2.2 today.  Today.  Good diuresis.  Nephrology on board and management per them.  Avoid nephrotoxic agents.   Hyperkalemia: Resolved.  Hyponatremia: 149 again today, nephrology following and managing.   Diabetes mellitus type 2 with hyperglycemia: Had hypoglycemic episodes due to his tube feedings on hold.  We will discontinue cynically. Core track and placed since 08/12/2021.   Shock liver: Continue conservative management LFTs are improving.   Sinus tachycardia: Had a transesophageal echo on 04/28/2021 that showed an EF of 60% no wall motion abnormality.  Before admission was on verapamil.  Continue Lopressor 25 mg p.o. twice daily.   Diabetic ulcer of left great toe ([place]): Noted.   Essential hypertension blood pressure labile but much better and within normal range now.  Will continue current dose of Lopressor and amlodipine.   Pleural effusion, bilateral Due to third spacing, he was giving albumin with good diuresis.   Sacral decubitus ulcer stage II present on admission: Wound care on board.  DVT prophylaxis: SCDs Start: 08/06/21 1536   Code Status: Full Code  Family Communication: Patient's friend at the bedside, discussed with him.  Status is: Inpatient  Remains inpatient appropriate because: Still very sick  Estimated body mass index is 28.44 kg/m as calculated from the following:   Height as of this encounter: 5\' 11"  (1.803 m).   Weight as of this encounter: 92.5 kg.  Pressure Injury 08/10/21 Coccyx Medial Stage 2 -  Partial thickness loss of dermis presenting as a shallow open injury with a red, pink wound bed without slough. (Active)  08/10/21 1700  Location: Coccyx (gluteal crease)  Location Orientation: Medial  Staging:  Stage 2 -  Partial thickness loss of dermis presenting as a shallow open injury with a red, pink wound bed without slough.  Wound Description (Comments):   Present on Admission: Yes     Pressure Injury 08/14/21 Buttocks Bilateral Deep Tissue Pressure Injury - Purple or maroon localized area of discolored intact skin or blood-filled blister due to damage of underlying soft tissue from pressure and/or shear. DTI (Active)  08/14/21 0800  Location: Buttocks  Location Orientation: Bilateral  Staging: Deep Tissue Pressure Injury - Purple or maroon localized area of discolored intact skin or blood-filled blister due to damage of underlying soft tissue from pressure and/or shear.  Wound Description (Comments): DTI  Present on Admission:     Nutritional Assessment: Body mass index is 28.44 kg/m.Marland Kitchen Seen by dietician.  I agree with the assessment and plan as outlined below: Nutrition Status: Nutrition Problem: Increased nutrient needs Etiology: post-op healing Signs/Symptoms: estimated needs Interventions: Ensure Enlive (each supplement provides 350kcal and 20 grams of protein), Tube feeding, Refer to RD note for recommendations  .  Skin Assessment: I have examined the patient's skin and I agree with the wound assessment as performed by the wound care RN as outlined below: Pressure Injury 08/10/21 Coccyx Medial Stage 2 -  Partial thickness loss of dermis presenting as a shallow open injury with a red, pink wound bed without slough. (Active)  08/10/21 1700  Location: Coccyx (gluteal crease)  Location Orientation: Medial  Staging: Stage 2 -  Partial thickness  loss of dermis presenting as a shallow open injury with a red, pink wound bed without slough.  Wound Description (Comments):   Present on Admission: Yes     Pressure Injury 08/14/21 Buttocks Bilateral Deep Tissue Pressure Injury - Purple or maroon localized area of discolored intact skin or blood-filled blister due to damage of underlying  soft tissue from pressure and/or shear. DTI (Active)  08/14/21 0800  Location: Buttocks  Location Orientation: Bilateral  Staging: Deep Tissue Pressure Injury - Purple or maroon localized area of discolored intact skin or blood-filled blister due to damage of underlying soft tissue from pressure and/or shear.  Wound Description (Comments): DTI  Present on Admission:     Consultants:  PCCM and ID  Procedures:  As above  Antimicrobials:  Anti-infectives (From admission, onward)    Start     Dose/Rate Route Frequency Ordered Stop   08/21/21 1300  ceFAZolin (ANCEF) IVPB 2g/100 mL premix        2 g 200 mL/hr over 30 Minutes Intravenous Every 8 hours 08/21/21 0918     08/21/21 1000  fluconazole (DIFLUCAN) tablet 400 mg  Status:  Discontinued        400 mg Per Tube Daily 08/20/21 1104 08/21/21 0925   08/21/21 1000  fluconazole (DIFLUCAN) IVPB 400 mg        400 mg 100 mL/hr over 120 Minutes Intravenous Every 24 hours 08/21/21 0925 08/27/21 0959   08/20/21 1200  fluconazole (DIFLUCAN) tablet 200 mg        200 mg Per Tube  Once 08/20/21 1104 08/20/21 1207   08/20/21 1000  fluconazole (DIFLUCAN) tablet 200 mg  Status:  Discontinued        200 mg Per Tube Daily 08/20/21 0825 08/20/21 1104   08/20/21 0047  Ampicillin-Sulbactam (UNASYN) 3 g in sodium chloride 0.9 % 100 mL IVPB  Status:  Discontinued        3 g 200 mL/hr over 30 Minutes Intravenous Every 12 hours 08/19/21 1619 08/19/21 1621   08/19/21 2100  Ampicillin-Sulbactam (UNASYN) 3 g in sodium chloride 0.9 % 100 mL IVPB  Status:  Discontinued        3 g 200 mL/hr over 30 Minutes Intravenous Every 8 hours 08/19/21 1621 08/21/21 0918   08/19/21 1200  Ampicillin-Sulbactam (UNASYN) 3 g in sodium chloride 0.9 % 100 mL IVPB  Status:  Discontinued        3 g 200 mL/hr over 30 Minutes Intravenous Every 8 hours 08/19/21 1055 08/19/21 1619   08/19/21 1015  fluconazole (DIFLUCAN) IVPB 200 mg  Status:  Discontinued        200 mg 100 mL/hr over  60 Minutes Intravenous Every 24 hours 08/19/21 0916 08/20/21 0825   08/17/21 2300  ceFAZolin (ANCEF) IVPB 2g/100 mL premix  Status:  Discontinued        2 g 200 mL/hr over 30 Minutes Intravenous Every 12 hours 08/17/21 1054 08/19/21 1055   08/16/21 1000  fluconazole (DIFLUCAN) tablet 200 mg  Status:  Discontinued        200 mg Per Tube Daily 08/15/21 1414 08/19/21 0916   08/15/21 2314  Ampicillin-Sulbactam (UNASYN) 3 g in sodium chloride 0.9 % 100 mL IVPB  Status:  Discontinued        3 g 200 mL/hr over 30 Minutes Intravenous Every 12 hours 08/15/21 1410 08/17/21 1054   08/13/21 1000  fluconazole (DIFLUCAN) tablet 400 mg  Status:  Discontinued        400  mg Per Tube Daily 08/12/21 1148 08/15/21 1414   08/12/21 1000  fluconazole (DIFLUCAN) tablet 400 mg  Status:  Discontinued        400 mg Oral Daily 08/12/21 0913 08/12/21 1148   08/11/21 1800  vancomycin (VANCOREADY) IVPB 1250 mg/250 mL  Status:  Discontinued        1,250 mg 166.7 mL/hr over 90 Minutes Intravenous Every 24 hours 08/10/21 1651 08/11/21 0939   08/11/21 1030  ceFEPIme (MAXIPIME) 2 g in sodium chloride 0.9 % 100 mL IVPB  Status:  Discontinued        2 g 200 mL/hr over 30 Minutes Intravenous Every 12 hours 08/11/21 0936 08/11/21 0939   08/11/21 1030  Ampicillin-Sulbactam (UNASYN) 3 g in sodium chloride 0.9 % 100 mL IVPB  Status:  Discontinued        3 g 200 mL/hr over 30 Minutes Intravenous Every 6 hours 08/11/21 0940 08/15/21 1410   08/10/21 1730  vancomycin (VANCOREADY) IVPB 1750 mg/350 mL        1,750 mg 175 mL/hr over 120 Minutes Intravenous  Once 08/10/21 1651 08/10/21 1956   08/10/21 1715  piperacillin-tazobactam (ZOSYN) IVPB 3.375 g  Status:  Discontinued        3.375 g 12.5 mL/hr over 240 Minutes Intravenous Every 8 hours 08/10/21 1651 08/11/21 0936   08/06/21 2200  ceFAZolin (ANCEF) IVPB 2g/100 mL premix  Status:  Discontinued        2 g 200 mL/hr over 30 Minutes Intravenous Every 8 hours 08/06/21 1829 08/10/21 1631    08/06/21 1415  vancomycin (VANCOREADY) IVPB 1750 mg/350 mL  Status:  Discontinued        1,750 mg 175 mL/hr over 120 Minutes Intravenous Every 24 hours 08/06/21 1400 08/06/21 1829   08/06/21 1415  ceFEPIme (MAXIPIME) 2 g in sodium chloride 0.9 % 100 mL IVPB  Status:  Discontinued        2 g 200 mL/hr over 30 Minutes Intravenous Every 8 hours 08/06/21 1400 08/06/21 1829          Subjective:  Patient seen and examined.  He is complaining of some shortness of breath today.  Looks a little weaker compared to yesterday.  Despite of shortness of breath, his oxygen demand is lower, currently on 2 L as opposed to 5 L that he was yesterday and saturation 96%.    Objective: Vitals:   08/23/21 0939 08/23/21 0955 08/23/21 1045 08/23/21 1056  BP: (!) 165/86  134/68   Pulse: (!) 106   (!) 105  Resp: (!) 21   (!) 23  Temp: 97.8 F (36.6 C)     TempSrc: Axillary     SpO2:  95%  93%  Weight:      Height:        Intake/Output Summary (Last 24 hours) at 08/23/2021 1058 Last data filed at 08/23/2021 0700 Gross per 24 hour  Intake 2753.76 ml  Output 550 ml  Net 2203.76 ml    Filed Weights   08/21/21 0500 08/22/21 0500 08/23/21 0500  Weight: 97.8 kg 90.6 kg 92.5 kg    Examination:  General exam: Appears very weak and slightly tremulous. Respiratory system: Rhonchi left middle and lower lobe. Respiratory effort normal. Cardiovascular system: S1 & S2 heard, RRR. No JVD, murmurs, rubs, gallops or clicks. No pedal edema. Gastrointestinal system: Abdomen is nondistended, soft and nontender. No organomegaly or masses felt. Normal bowel sounds heard. Central nervous system: Alert and oriented. No focal neurological deficits. Extremities:  Symmetric 5 x 5 power.   Data Reviewed: I have personally reviewed following labs and imaging studies  CBC: Recent Labs  Lab 08/20/21 0327 08/20/21 1321 08/21/21 0515 08/21/21 0625 08/22/21 0814 08/22/21 0837 08/23/21 0450  WBC 21.1*  --  23.7*   --  22.5* 27.1* 18.8*  NEUTROABS 16.1*  --  18.9*  --   --   --   --   HGB 7.7*   < > 8.0* 7.8* 6.4* 7.3* 6.6*  HCT 23.4*   < > 24.4* 23.0* 20.9* 23.7* 21.1*  MCV 86.3  --  88.1  --  95.4 95.2 95.0  PLT 338  --  337  --  298 307 275   < > = values in this interval not displayed.    Basic Metabolic Panel: Recent Labs  Lab 08/19/21 2155 08/20/21 0327 08/21/21 0515 08/21/21 0625 08/22/21 0530 08/23/21 0450  NA 149* 148* 152* 156* 139 149*  K 4.5 4.6 4.1 4.2 3.5 4.3  CL 116* 118* 120*  --  111 120*  CO2 21* 22 23  --  21* 22  GLUCOSE 195* 258* 179*  --  436* 78  BUN 119* 114* 96*  --  76* 81*  CREATININE 3.00* 2.94* 2.26*  --  1.99* 2.21*  CALCIUM 9.1 9.1 9.0  --  8.0* 8.2*  PHOS  --  4.3 4.6  --  5.0* 5.7*    GFR: Estimated Creatinine Clearance: 48 mL/min (A) (by C-G formula based on SCr of 2.21 mg/dL (H)). Liver Function Tests: Recent Labs  Lab 08/17/21 0400 08/18/21 0334 08/19/21 0430 08/20/21 0327 08/21/21 0515 08/22/21 0530 08/23/21 0450  AST 155* 74* 107*  --   --   --   --   ALT 114* 77* 46*  --   --   --   --   ALKPHOS 715* 636* 596*  --   --   --   --   BILITOT 2.8* 3.0* 2.4*  --   --   --   --   PROT 5.9* 6.3* 6.1*  --   --   --   --   ALBUMIN <1.5* 2.1* 2.3* 2.2* 2.1* 1.8* 1.8*    No results for input(s): LIPASE, AMYLASE in the last 168 hours. No results for input(s): AMMONIA in the last 168 hours. Coagulation Profile: No results for input(s): INR, PROTIME in the last 168 hours. Cardiac Enzymes: No results for input(s): CKTOTAL, CKMB, CKMBINDEX, TROPONINI in the last 168 hours.  BNP (last 3 results) No results for input(s): PROBNP in the last 8760 hours. HbA1C: No results for input(s): HGBA1C in the last 72 hours. CBG: Recent Labs  Lab 08/22/21 1951 08/22/21 2340 08/23/21 0348 08/23/21 0727 08/23/21 0811  GLUCAP 211* 220* 99 54* 94    Lipid Profile: No results for input(s): CHOL, HDL, LDLCALC, TRIG, CHOLHDL, LDLDIRECT in the last 72  hours.  Thyroid Function Tests: No results for input(s): TSH, T4TOTAL, FREET4, T3FREE, THYROIDAB in the last 72 hours. Anemia Panel: No results for input(s): VITAMINB12, FOLATE, FERRITIN, TIBC, IRON, RETICCTPCT in the last 72 hours. Sepsis Labs: Recent Labs  Lab 08/19/21 1412  PROCALCITON 0.51     Recent Results (from the past 240 hour(s))  Culture, Respiratory w Gram Stain     Status: None   Collection Time: 08/13/21  4:30 PM   Specimen: Tracheal Aspirate; Respiratory  Result Value Ref Range Status   Specimen Description TRACHEAL ASPIRATE  Final   Special Requests Normal  Final   Gram Stain   Final    NO SQUAMOUS EPITHELIAL CELLS SEEN FEW WBC SEEN NO ORGANISMS SEEN    Culture   Final    NO GROWTH 2 DAYS Performed at [facility], [address], [place], [place] [address]    Report Status 08/15/2021 FINAL  Final  Body fluid culture w Gram Stain     Status: None   Collection Time: 08/14/21  8:15 AM   Specimen: Pleural Fluid  Result Value Ref Range Status   Specimen Description FLUID  Final   Special Requests  PLEURAL FLUID  Final   Gram Stain   Final    MODERATE WBC PRESENT, PREDOMINANTLY MONONUCLEAR NO ORGANISMS SEEN    Culture   Final    NO GROWTH 3 DAYS Performed at [facility], [place] [address], [place], [place] [address]    Report Status 08/17/2021 FINAL  Final       Radiology Studies: No results found.  Scheduled Meds:  sodium chloride   Intravenous Once   amLODipine  5 mg Per NG tube Daily   Chlorhexidine Gluconate Cloth  6 each Topical Daily   collagenase   Topical Daily   darbepoetin (ARANESP) injection - NON-DIALYSIS  150 mcg Subcutaneous Q Mon-1800   feeding supplement  237 mL Oral TID BM   feeding supplement (PROSource TF)  45 mL Per Tube BID   gabapentin  200 mg Per Tube Q12H   insulin aspart  0-20 Units Subcutaneous Q4H   insulin aspart  8 Units Subcutaneous Q4H   lidocaine  2 patch Transdermal Q24H   living well with diabetes  book   Does not apply Once   LORazepam  0.5 mg Intravenous Q6H   mouth rinse  15 mL Mouth Rinse BID   melatonin  3 mg Oral QHS   metoprolol tartrate  25 mg Per NG tube BID   mupirocin ointment  1 application Topical BID   [START ON 08/24/2021] pantoprazole  40 mg Intravenous Q12H   sodium chloride flush  10-40 mL Intracatheter Q12H   sodium chloride flush  10-40 mL Intracatheter Q12H   sucralfate  1 g Per Tube TID WC & HS   traZODone  100 mg Per Tube QHS   venlafaxine  37.5 mg Per Tube Daily   Continuous Infusions:   ceFAZolin (ANCEF) IV Stopped (08/23/21 0556)   feeding supplement (VITAL 1.5 CAL) Stopped (08/23/21 0004)   fluconazole (DIFLUCAN) IV Stopped (08/22/21 1335)   pantoprazole 8 mg/hr (08/23/21 0922)   promethazine (PHENERGAN) injection (IM or IVPB) 25 mg (08/10/21 0506)     LOS: 17 days   Time spent: 35 minutes   Darliss Cheney, MD [facility]  08/23/2021, 10:58 AM  Please page via [facility] and do not message via secure chat for anything urgent. Secure chat can be used for anything non urgent.  How to contact the [facility] Attending or Consulting provider [place] or covering provider during after hours [place], for this patient?  Check the care team in [facility] and look for a) attending/consulting [facility] provider listed and b) the [facility] team listed. Page or secure chat [phone]. Log into [email or web address] and use [place] universal password to access. If you do not have the password, please contact the hospital operator. Locate the [facility] provider you are looking for under [facility] and page to a number that you can be directly reached. If you still have difficulty reaching the provider, please  page the [facility] (Director on Call) for the Hospitalists listed on amion for assistance.

## 2021-08-23 NOTE — Progress Notes (Signed)
Subjective:   More tarry stools overnight. Hb 6.6 again this AM, 2u pRBC ordered.  GI following and planning endo tomorrow.  UOP 1.5L yesterday.  BUN/Cr improved from 96/2.26 to 77/1.99 to 81/2.2    Objective Vital signs in last 24 hours: Vitals:   08/23/21 0730 08/23/21 0752 08/23/21 0830 08/23/21 0834  BP: (!) 141/83  134/77   Pulse:  99  98  Resp:  17  17  Temp:      TempSrc:      SpO2:  96%  95%  Weight:      Height:       Weight change: 1.9 kg  Intake/Output Summary (Last 24 hours) at 08/23/2021 4782 Last data filed at 08/23/2021 0700 Gross per 24 hour  Intake 2773.75 ml  Output 1450 ml  Net 1323.75 ml     Assessment/ Plan: Pt is a 45 y.o. yo male CKD ( crt 1.5-2) , DM, HTN who was admitted on 08/06/2021 with MSSA bacteremia/osteomyelitis- s/p T4/5 laminectomy, pediculetomy and spinal cord decompression-  complicating A on CRF Assessment/Plan: 1. 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   Developed AKI on CKD in setting of severe infection c/w ATN.  Has been non oliguric.   crt solidly trending down and he's essentially back to baseline. Did not require RRT.  Now has condom cath and reports no voiding issues.  Cont to follow I/Os, dose meds for CrCl.  2. HTN/vol-  Good UOP but net + yesterday and for admission; will give lasix 40 IV today.  Can continue to dose PRN to maintain euvolemia.   3. Anemia-  Further tarry stools and drop in Hb overnight.  Being transfused.  Gi plans to scope.  Per primary.   4. Hyperkalemia-  currently K ok - cont to follow closely.  5. Hypernatremia-  Na 149 today, he is no longer NPO and taking good po - will watch for now  Given back to baseline will sign off.   Will arrange hospital f/u in 4 weeks in clinic (f/b Dr. Hollie Salk)  [place]: Basic Metabolic Panel: Recent Labs  Lab 08/21/21 0515 08/21/21 0625 08/22/21 0530 08/23/21 0450  NA 152* 156* 139 149*  K 4.1 4.2 3.5 4.3  CL 120*  --  111 120*   CO2 23  --  21* 22  GLUCOSE 179*  --  436* 78  BUN 96*  --  76* 81*  CREATININE 2.26*  --  1.99* 2.21*  CALCIUM 9.0  --  8.0* 8.2*  PHOS 4.6  --  5.0* 5.7*    Liver Function Tests: Recent Labs  Lab 08/17/21 0400 08/18/21 0334 08/19/21 0430 08/20/21 0327 08/21/21 0515 08/22/21 0530 08/23/21 0450  AST 155* 74* 107*  --   --   --   --   ALT 114* 77* 46*  --   --   --   --   ALKPHOS 715* 636* 596*  --   --   --   --   BILITOT 2.8* 3.0* 2.4*  --   --   --   --   PROT 5.9* 6.3* 6.1*  --   --   --   --   ALBUMIN <1.5* 2.1* 2.3*   < > 2.1* 1.8* 1.8*   < > = values in this interval not displayed.    No results for input(s): LIPASE, AMYLASE in the last 168 hours. No results for input(s): AMMONIA in the  last 168 hours. CBC: Recent Labs  Lab 08/20/21 0327 08/20/21 1321 08/21/21 0515 08/21/21 0625 08/22/21 0814 08/22/21 0837 08/23/21 0450  WBC 21.1*  --  23.7*  --  22.5* 27.1* 18.8*  NEUTROABS 16.1*  --  18.9*  --   --   --   --   HGB 7.7*   < > 8.0*   < > 6.4* 7.3* 6.6*  HCT 23.4*   < > 24.4*   < > 20.9* 23.7* 21.1*  MCV 86.3  --  88.1  --  95.4 95.2 95.0  PLT 338  --  337  --  298 307 275   < > = values in this interval not displayed.    Cardiac Enzymes: No results for input(s): CKTOTAL, CKMB, CKMBINDEX, TROPONINI in the last 168 hours.  CBG: Recent Labs  Lab 08/22/21 1951 08/22/21 2340 08/23/21 0348 08/23/21 0727 08/23/21 0811  GLUCAP 211* 220* 99 54* 94     Iron Studies: No results for input(s): IRON, TIBC, TRANSFERRIN, FERRITIN in the last 72 hours. Studies/Results: DG Abd 1 View  Result Date: 08/21/2021 CLINICAL DATA:  Back pain, free air EXAM: ABDOMEN - 1 VIEW COMPARISON:  Abdominal x-ray 08/14/2021 FINDINGS: Enteric tube tip is in the region of the distal stomach. Bowel gas pattern appears within normal limits. No suspicious calcifications. IMPRESSION: No acute process identified. Electronically Signed   By: Ofilia Neas M.D.   On: 08/21/2021  10:40   DG CHEST PORT 1 VIEW  Result Date: 08/21/2021 CLINICAL DATA:  Back pain, shortness of breath EXAM: PORTABLE CHEST 1 VIEW COMPARISON:  Chest x-ray 08/20/2021 FINDINGS: Right PICC tip in the SVC. Enteric tube tip below the diaphragm. Cardiomediastinal silhouette appears unchanged. Diffuse prominent interstitial and airspace opacities bilaterally left worse than right with improved aeration of the right lung since previous study. Possible trace left pleural effusion. No pneumothorax visualized. IMPRESSION: 1. Mildly improved aeration of the right lung since previous study. 2. Otherwise no significant change. Electronically Signed   By: Ofilia Neas M.D.   On: 08/21/2021 10:41   VAS [place] LOWER EXTREMITY VENOUS (DVT)  Result Date: 08/21/2021  Lower Venous DVT Study Patient Name:  Jose [name]  Date of Exam:   08/21/2021 Medical Rec #: [id number]        Accession #:    [id number] Date of Birth: 11/02/1975        Patient Gender: M Patient Age:   45 years Exam Location:  [facility] Procedure:      VAS [place] LOWER EXTREMITY VENOUS (DVT) Referring Phys: RAVI PAHWANI --------------------------------------------------------------------------------  Indications: Swelling, and Edema.  Comparison Study: no prior Performing Technologist: Archie Patten RVS  Examination Guidelines: A complete evaluation includes B-mode imaging, spectral Doppler, color Doppler, and power Doppler as needed of all accessible portions of each vessel. Bilateral testing is considered an integral part of a complete examination. Limited examinations for reoccurring indications may be performed as noted. The reflux portion of the exam is performed with the patient in reverse Trendelenburg.  +---------+---------------+---------+-----------+----------+--------------+ RIGHT    CompressibilityPhasicitySpontaneityPropertiesThrombus Aging +---------+---------------+---------+-----------+----------+--------------+ CFV      Full            Yes      Yes                                 +---------+---------------+---------+-----------+----------+--------------+ SFJ      Full                                                        +---------+---------------+---------+-----------+----------+--------------+  FV Prox  Full                                                        +---------+---------------+---------+-----------+----------+--------------+ FV Mid   Full                                                        +---------+---------------+---------+-----------+----------+--------------+ FV DistalFull                                                        +---------+---------------+---------+-----------+----------+--------------+ PFV      Full                                                        +---------+---------------+---------+-----------+----------+--------------+ POP      Full           Yes      Yes                                 +---------+---------------+---------+-----------+----------+--------------+ PTV      Full                                                        +---------+---------------+---------+-----------+----------+--------------+ PERO     Full                                                        +---------+---------------+---------+-----------+----------+--------------+   +---------+---------------+---------+-----------+----------+--------------+ LEFT     CompressibilityPhasicitySpontaneityPropertiesThrombus Aging +---------+---------------+---------+-----------+----------+--------------+ CFV      Full           Yes      Yes                                 +---------+---------------+---------+-----------+----------+--------------+ SFJ      Full                                                        +---------+---------------+---------+-----------+----------+--------------+ FV Prox  Full                                                         +---------+---------------+---------+-----------+----------+--------------+  FV Mid   Full                                                        +---------+---------------+---------+-----------+----------+--------------+ FV DistalFull                                                        +---------+---------------+---------+-----------+----------+--------------+ PFV      Full                                                        +---------+---------------+---------+-----------+----------+--------------+ POP      Full           Yes      Yes                                 +---------+---------------+---------+-----------+----------+--------------+ PTV      Full                                                        +---------+---------------+---------+-----------+----------+--------------+ PERO     Full                                                        +---------+---------------+---------+-----------+----------+--------------+     Summary: BILATERAL: - No evidence of deep vein thrombosis seen in the lower extremities, bilaterally. -No evidence of popliteal cyst, bilaterally.   *See table(s) above for measurements and observations. Electronically signed by Jamelle Haring on 08/21/2021 at 4:59:21 PM.    Final    VAS [place] UPPER EXTREMITY VENOUS DUPLEX  Result Date: 08/21/2021 UPPER VENOUS STUDY  Patient Name:  Jose [name]  Date of Exam:   08/21/2021 Medical Rec #: [id number]        Accession #:    [id number] Date of Birth: 1976/10/02        Patient Gender: M Patient Age:   53 years Exam [facility]  [facility] Procedure:      VAS [place] UPPER EXTREMITY VENOUS DUPLEX Referring Phys: RAVI PAHWANI --------------------------------------------------------------------------------  Indications: Edema Comparison Study: no prior Performing Technologist: Archie Patten RVS  Examination Guidelines: A complete evaluation includes B-mode imaging, spectral Doppler,  color Doppler, and power Doppler as needed of all accessible portions of each vessel. Bilateral testing is considered an integral part of a complete examination. Limited examinations for reoccurring indications may be performed as noted.  Right Findings: +----------+------------+---------+-----------+----------+-------+ RIGHT     CompressiblePhasicitySpontaneousPropertiesSummary +----------+------------+---------+-----------+----------+-------+ Subclavian    Full       Yes       Yes                      +----------+------------+---------+-----------+----------+-------+  Left Findings: +----------+------------+---------+-----------+----------+-------+ LEFT      CompressiblePhasicitySpontaneousPropertiesSummary +----------+------------+---------+-----------+----------+-------+ IJV           Full       Yes       Yes                      +----------+------------+---------+-----------+----------+-------+ Subclavian    Full       Yes       Yes                      +----------+------------+---------+-----------+----------+-------+ Axillary      Full       Yes       Yes                      +----------+------------+---------+-----------+----------+-------+ Brachial      Full       Yes       Yes                      +----------+------------+---------+-----------+----------+-------+ Radial        Full                                          +----------+------------+---------+-----------+----------+-------+ Ulnar         Full                                          +----------+------------+---------+-----------+----------+-------+ Cephalic      Full                                          +----------+------------+---------+-----------+----------+-------+ Basilic       Full                                          +----------+------------+---------+-----------+----------+-------+  Summary:  Right: No evidence of thrombosis in the subclavian.  Left:  No evidence of deep vein thrombosis in the upper extremity. No evidence of superficial vein thrombosis in the upper extremity.  *See table(s) above for measurements and observations.  Diagnosing physician: Jamelle Haring Electronically signed by Jamelle Haring on 08/21/2021 at 5:05:00 PM.    Final    Medications: Infusions:   ceFAZolin (ANCEF) IV Stopped (08/23/21 0556)   feeding supplement (VITAL 1.5 CAL) Stopped (08/23/21 0004)   fluconazole (DIFLUCAN) IV Stopped (08/22/21 1335)   pantoprazole 8 mg/hr (08/23/21 0922)   promethazine (PHENERGAN) injection (IM or IVPB) 25 mg (08/10/21 0506)    Scheduled Medications:  sodium chloride   Intravenous Once   amLODipine  5 mg Per NG tube Daily   Chlorhexidine Gluconate Cloth  6 each Topical Daily   collagenase   Topical Daily   darbepoetin (ARANESP) injection - NON-DIALYSIS  150 mcg Subcutaneous Q Mon-1800   feeding supplement  237 mL Oral TID BM   feeding supplement (PROSource TF)  45 mL Per Tube BID   gabapentin  200 mg Per Tube Q12H   insulin aspart  0-20 Units Subcutaneous Q4H   insulin aspart  8 Units Subcutaneous Q4H  lidocaine  2 patch Transdermal Q24H   living well with diabetes book   Does not apply Once   LORazepam  0.5 mg Intravenous Q6H   mouth rinse  15 mL Mouth Rinse BID   melatonin  3 mg Oral QHS   metoprolol tartrate  25 mg Per NG tube BID   mupirocin ointment  1 application Topical BID   [START ON 08/24/2021] pantoprazole  40 mg Intravenous Q12H   sodium chloride flush  10-40 mL Intracatheter Q12H   sodium chloride flush  10-40 mL Intracatheter Q12H   sucralfate  1 g Per Tube TID WC & HS   traZODone  100 mg Per Tube QHS   venlafaxine  37.5 mg Per Tube Daily    have reviewed scheduled and prn medications.  Physical Exam: General:  appears more comfortable Heart: RRR Lungs: clear Abdomen: soft, no TTP  Extremities: 1+ pitting edema    08/23/2021,9:26 AM  LOS: 17 days

## 2021-08-24 ENCOUNTER — Inpatient Hospital Stay (HOSPITAL_COMMUNITY): Payer: 59 | Admitting: Certified Registered Nurse Anesthetist

## 2021-08-24 ENCOUNTER — Encounter (HOSPITAL_COMMUNITY)
Admission: EM | Disposition: A | Discharge status: Rehabilitation Facility | Payer: Self-pay | Source: Home / Self Care | Attending: Family Medicine

## 2021-08-24 ENCOUNTER — Encounter (HOSPITAL_COMMUNITY): Payer: Self-pay | Admitting: Family Medicine

## 2021-08-24 DIAGNOSIS — M4624 Osteomyelitis of vertebra, thoracic region: Secondary | ICD-10-CM | POA: Diagnosis not present

## 2021-08-24 HISTORY — PX: ESOPHAGOGASTRODUODENOSCOPY (EGD) WITH PROPOFOL: SHX5813

## 2021-08-24 LAB — RENAL FUNCTION PANEL
Albumin: 1.8 g/dL — ABNORMAL LOW (ref 3.5–5.0)
Anion gap: 8 (ref 5–15)
BUN: 70 mg/dL — ABNORMAL HIGH (ref 6–20)
CO2: 21 mmol/L — ABNORMAL LOW (ref 22–32)
Calcium: 8.1 mg/dL — ABNORMAL LOW (ref 8.9–10.3)
Chloride: 115 mmol/L — ABNORMAL HIGH (ref 98–111)
Creatinine, Ser: 2.18 mg/dL — ABNORMAL HIGH (ref 0.61–1.24)
GFR, Estimated: 37 mL/min — ABNORMAL LOW (ref >60)
Glucose, Bld: 112 mg/dL — ABNORMAL HIGH (ref 70–99)
Phosphorus: 5.5 mg/dL — ABNORMAL HIGH (ref 2.5–4.6)
Potassium: 4.2 mmol/L (ref 3.5–5.1)
Sodium: 144 mmol/L (ref 135–145)

## 2021-08-24 LAB — TYPE AND SCREEN
ABO/RH(D): A POS
Antibody Screen: NEGATIVE
Unit division: 0
Unit division: 0

## 2021-08-24 LAB — BPAM RBC
Blood Product Expiration Date: 202211052359
Blood Product Expiration Date: 202211052359
ISSUE DATE / TIME: 202210300635
ISSUE DATE / TIME: 202210300934
Unit Type and Rh: 6200
Unit Type and Rh: 6200

## 2021-08-24 LAB — GLUCOSE, CAPILLARY
Glucose-Capillary: 124 mg/dL — ABNORMAL HIGH (ref 70–99)
Glucose-Capillary: 124 mg/dL — ABNORMAL HIGH (ref 70–99)
Glucose-Capillary: 126 mg/dL — ABNORMAL HIGH (ref 70–99)
Glucose-Capillary: 131 mg/dL — ABNORMAL HIGH (ref 70–99)
Glucose-Capillary: 158 mg/dL — ABNORMAL HIGH (ref 70–99)
Glucose-Capillary: 161 mg/dL — ABNORMAL HIGH (ref 70–99)
Glucose-Capillary: 179 mg/dL — ABNORMAL HIGH (ref 70–99)

## 2021-08-24 LAB — CBC
HCT: 27.4 % — ABNORMAL LOW (ref 39.0–52.0)
Hemoglobin: 8.5 g/dL — ABNORMAL LOW (ref 13.0–17.0)
MCH: 28.6 pg (ref 26.0–34.0)
MCHC: 31 g/dL (ref 30.0–36.0)
MCV: 92.3 fL (ref 80.0–100.0)
Platelets: 321 10*3/uL (ref 150–400)
RBC: 2.97 MIL/uL — ABNORMAL LOW (ref 4.22–5.81)
RDW: 18.9 % — ABNORMAL HIGH (ref 11.5–15.5)
WBC: 16.6 10*3/uL — ABNORMAL HIGH (ref 4.0–10.5)
nRBC: 0 % (ref 0.0–0.2)

## 2021-08-24 SURGERY — ESOPHAGOGASTRODUODENOSCOPY (EGD) WITH PROPOFOL
Anesthesia: Monitor Anesthesia Care

## 2021-08-24 MED ORDER — TRAZODONE HCL 100 MG PO TABS
100.0000 mg | ORAL_TABLET | Freq: Every day | ORAL | Status: DC
  Administered 2021-08-24 – 2021-08-27 (×4): 100 mg via ORAL
  Filled 2021-08-24 (×4): qty 1

## 2021-08-24 MED ORDER — SODIUM CHLORIDE 0.9 % IV SOLN: INTRAVENOUS | Status: DC | PRN

## 2021-08-24 MED ORDER — HYDROXYZINE HCL 25 MG PO TABS
12.5000 mg | ORAL_TABLET | Freq: Three times a day (TID) | ORAL | Status: DC | PRN
  Administered 2021-08-26: 12.5 mg via ORAL
  Filled 2021-08-24: qty 1

## 2021-08-24 MED ORDER — ACETAMINOPHEN 650 MG RE SUPP: 650.0000 mg | Freq: Four times a day (QID) | RECTAL | Status: DC | PRN

## 2021-08-24 MED ORDER — METOPROLOL TARTRATE 25 MG PO TABS
25.0000 mg | ORAL_TABLET | Freq: Two times a day (BID) | ORAL | Status: DC
  Administered 2021-08-24 – 2021-08-25 (×2): 25 mg via ORAL
  Filled 2021-08-24 (×2): qty 1

## 2021-08-24 MED ORDER — SODIUM CHLORIDE 0.9 % IV SOLN: INTRAVENOUS | Status: DC

## 2021-08-24 MED ORDER — LORAZEPAM 0.5 MG PO TABS
0.5000 mg | ORAL_TABLET | Freq: Four times a day (QID) | ORAL | Status: DC | PRN
  Administered 2021-08-24 – 2021-08-28 (×7): 0.5 mg via ORAL
  Filled 2021-08-24 (×7): qty 1

## 2021-08-24 MED ORDER — PROPOFOL 500 MG/50ML IV EMUL
INTRAVENOUS | Status: DC | PRN
  Administered 2021-08-24: 100 ug/kg/min via INTRAVENOUS

## 2021-08-24 MED ORDER — GABAPENTIN 100 MG PO CAPS
200.0000 mg | ORAL_CAPSULE | Freq: Two times a day (BID) | ORAL | Status: DC
  Administered 2021-08-24 – 2021-08-30 (×12): 200 mg via ORAL
  Filled 2021-08-24 (×12): qty 2

## 2021-08-24 MED ORDER — PANTOPRAZOLE SODIUM 40 MG PO TBEC
40.0000 mg | DELAYED_RELEASE_TABLET | Freq: Two times a day (BID) | ORAL | Status: DC
  Administered 2021-08-25 – 2021-08-30 (×11): 40 mg via ORAL
  Filled 2021-08-24 (×11): qty 1

## 2021-08-24 MED ORDER — FENTANYL CITRATE PF 50 MCG/ML IJ SOSY
25.0000 ug | PREFILLED_SYRINGE | INTRAMUSCULAR | Status: DC | PRN
  Administered 2021-08-24 – 2021-08-27 (×11): 50 ug via INTRAVENOUS
  Filled 2021-08-24 (×11): qty 1

## 2021-08-24 MED ORDER — SENNOSIDES-DOCUSATE SODIUM 8.6-50 MG PO TABS: 2.0000 | ORAL_TABLET | Freq: Every evening | ORAL | Status: DC | PRN

## 2021-08-24 MED ORDER — SUCRALFATE 1 GM/10ML PO SUSP
1.0000 g | Freq: Three times a day (TID) | ORAL | Status: DC
  Administered 2021-08-25 – 2021-08-30 (×21): 1 g via ORAL
  Filled 2021-08-24 (×28): qty 10

## 2021-08-24 MED ORDER — ACETAMINOPHEN 325 MG PO TABS
650.0000 mg | ORAL_TABLET | Freq: Four times a day (QID) | ORAL | Status: DC | PRN
  Administered 2021-08-28: 650 mg via ORAL
  Filled 2021-08-24: qty 2

## 2021-08-24 MED ORDER — VENLAFAXINE HCL 37.5 MG PO TABS
37.5000 mg | ORAL_TABLET | Freq: Two times a day (BID) | ORAL | Status: DC
  Administered 2021-08-24 – 2021-08-27 (×6): 37.5 mg via ORAL
  Filled 2021-08-24 (×7): qty 1

## 2021-08-24 MED ORDER — POLYETHYLENE GLYCOL 3350 17 G PO PACK: 17.0000 g | PACK | Freq: Every day | ORAL | Status: DC | PRN

## 2021-08-24 MED ORDER — AMLODIPINE BESYLATE 5 MG PO TABS
5.0000 mg | ORAL_TABLET | Freq: Every day | ORAL | Status: DC
  Administered 2021-08-25 – 2021-08-26 (×2): 5 mg via ORAL
  Filled 2021-08-24 (×2): qty 1

## 2021-08-24 SURGICAL SUPPLY — 15 items

## 2021-08-24 NOTE — Progress Notes (Signed)
OT Cancellation Note  Patient Details Name: Jose [name] MRN: [id number] DOB: [birth year]   Cancelled Treatment:    Reason Eval/Treat Not Completed: Patient at procedure or test/ unavailable- pt off unit at procedure. Will follow and see as able.   Jolaine Artist, OT Acute Rehabilitation Services Pager [phone] Office [phone]   Delight Stare 08/24/2021, 8:24 AM

## 2021-08-24 NOTE — Progress Notes (Signed)
PROGRESS NOTE    Jose [name]  ZOX:[id number] DOB: 01/18/1976 DOA: 08/06/2021 PCP: [name], NP   Brief Narrative:  Jose [name] is a 45 y.o. with a pertinent PMH of GAD, HTN, HLD, macular degeneration, T2DM, CKD (Stage 3), tobacco use disorder, hx of pleural effusion, recent admission for MSSA bacteremia/osteomyelitis who was discharged home with IV antibiotics on 08/03/2021, returned to the ED on 08/07/2021 with worsening back pain and admitted under [facility], now s/p T4-T5 laminectomy, left T5 pediculotomy, and spinal cord decompression. On 10/17 patient developed coffee-ground emesis, s/p EGD showing circumferential esophagitis with hemorrhage and non-bleeding duodenal ulcers.On 10/17, pccm was consulted for acute hypoxia.    Significant Events: 10/13 -admitted with worsening thoracic osteomyelitis, constipation/abd distention, neurosurgery consulted  10/14 - T4-T5 laminectomy, left T5 pediculotomy, and spinal cord decompression 10/17 - Hgb drop to 6.2, coffee ground emesis s/p EGD with esophagitis and peptic ulcer 10/17 - hypoxia and pccm consult  10/19 intubated 10/21 developed worsening kidney function, nephro consulted.  10/24 extubated Transferred under [facility] on 08/18/2021.  Assessment & Plan:   Principal Problem:   Osteomyelitis of thoracic spine (HCC) Active Problems:   Type 2 diabetes mellitus with diabetic polyneuropathy, without long-term current use of insulin (HCC)   Hyperlipidemia   GAD (generalized anxiety disorder)   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)   Essential hypertension   CKD (chronic kidney disease) stage 3, GFR 30-59 ml/min (HCC)   Pleural effusion, bilateral  Acute hypoxic respiratory failure/aspiration pneumonia, pneumonitis/MSSA bacteremia: Patient required intubation on 08/12/2021, extubated on 08/17/2021, oxygenation improved, currently on 2 L and maintaining oxygen saturation at 96%.  He recently completed course of Unasyn.  Chest x-ray  08/19/2019 shows persistent left lung pneumonia and worsening of atelectasis and consolidation at lung bases.  Procalcitonin improved though.  Antibiotics were switched to Unasyn but with improved procalcitonin, concern for aspiration pneumonia was very low and after discussing with pharmacy, we switched back to cefazolin since that has better coverage for bacteremia.  Breathing is improved.  He is down to 3 L of oxygen saturating 94%.  Acute blood loss anemia/upper GI bleed/candidal esophagitis and PUD s/p EGD/possible PE: Patient had acute drop in hemoglobin from 8.1-5.9 within 24 hours on the morning of 08/19/2021, received 2 units of PRBC transfusion.  Hemoglobin improved to 7.7 but then dropped to 7.1 again early morning and he had a large melanotic stool so 1 unit of PRBC transfusion was ordered by night hospitalist. I consulted GI again on 08/21/2021.  They stopped his tube feedings for possible plan for EGD on 08/22/2021 and this plan was canceled despite of patient's hemoglobin dropping to 6.4.  Patient was given 1 unit of PRBC transfusion.  Patient's hemoglobin improved to 7.3 but dropped again to 6.6.  Patient had multiple melanotic BMs on the early morning of 08/23/2021.  His hemoglobin once again dropped to 6.6.  GI was notified.  GI recommended continuing Protonix.  Patient finally underwent EGD today which shows no active signs of bleeding and healing esophagitis.  Recommendation to continue Protonix and resume tube feedings.  Also his VQ scan cannot rule out PE on the left side, intermediate probability.  Doppler lower extremity and left upper extremity negative for any DVT.  PE cannot be ruled out completely but unfortunately due to recurrent acute blood loss anemia, we cannot initiate him on any anticoagulation.  Hopefully once his renal function improves, we will do CT angiogram of chest for definitive study but with  negative DVT, chances of PE are much less now.   ATN on top of acute on  chronic kidney disease stage IIIa: baseline creatinine of 1.5-2.0.  Creatinine peaked at 4.5 on 08/17/2021, creatinine improved to 1.99 But then jumped to 2.2 which is a stable today. Good diuresis.  Nephrology on board and management per them.  Avoid nephrotoxic agents.   Hyperkalemia: Resolved.  Hyponatremia: Resolved.   Diabetes mellitus type 2 with hyperglycemia: Had hypoglycemic episodes due to his tube feedings on hold.  Semglee was discontinued due to hypoglycemic episodes.  We will continue SSI for now. Core track and placed since 08/12/2021.   Shock liver: Continue conservative management LFTs are improving.   Sinus tachycardia: Had a transesophageal echo on 04/28/2021 that showed an EF of 60% no wall motion abnormality.  Before admission was on verapamil.  Continue Lopressor 25 mg p.o. twice daily.   Diabetic ulcer of left great toe ([place]): Noted.   Essential hypertension blood pressure labile but much better and within normal range now.  Will continue current dose of Lopressor and amlodipine.   Pleural effusion, bilateral Due to third spacing, he was giving albumin with good diuresis.   Sacral decubitus ulcer stage II present on admission: Wound care on board.  DVT prophylaxis: SCDs Start: 08/06/21 1536   Code Status: Full Code  Family Communication: None at bedside.  Status is: Inpatient  Remains inpatient appropriate because: Still very sick  Estimated body mass index is 28.44 kg/m as calculated from the following:   Height as of this encounter: 5\' 11"  (1.803 m).   Weight as of this encounter: 92.5 kg.  Pressure Injury 08/10/21 Coccyx Medial Stage 2 -  Partial thickness loss of dermis presenting as a shallow open injury with a red, pink wound bed without slough. (Active)  08/10/21 1700  Location: Coccyx (gluteal crease)  Location Orientation: Medial  Staging: Stage 2 -  Partial thickness loss of dermis presenting as a shallow open injury with a red, pink wound bed  without slough.  Wound Description (Comments):   Present on Admission: Yes     Pressure Injury 08/14/21 Buttocks Bilateral Deep Tissue Pressure Injury - Purple or maroon localized area of discolored intact skin or blood-filled blister due to damage of underlying soft tissue from pressure and/or shear. DTI (Active)  08/14/21 0800  Location: Buttocks  Location Orientation: Bilateral  Staging: Deep Tissue Pressure Injury - Purple or maroon localized area of discolored intact skin or blood-filled blister due to damage of underlying soft tissue from pressure and/or shear.  Wound Description (Comments): DTI  Present on Admission:     Nutritional Assessment: Body mass index is 28.44 kg/m.Marland Kitchen Seen by dietician.  I agree with the assessment and plan as outlined below: Nutrition Status: Nutrition Problem: Increased nutrient needs Etiology: post-op healing Signs/Symptoms: estimated needs Interventions: Ensure Enlive (each supplement provides 350kcal and 20 grams of protein), Tube feeding, Refer to RD note for recommendations  .  Skin Assessment: I have examined the patient's skin and I agree with the wound assessment as performed by the wound care RN as outlined below: Pressure Injury 08/10/21 Coccyx Medial Stage 2 -  Partial thickness loss of dermis presenting as a shallow open injury with a red, pink wound bed without slough. (Active)  08/10/21 1700  Location: Coccyx (gluteal crease)  Location Orientation: Medial  Staging: Stage 2 -  Partial thickness loss of dermis presenting as a shallow open injury with a red, pink wound bed without slough.  Wound Description (Comments):   Present on Admission: Yes     Pressure Injury 08/14/21 Buttocks Bilateral Deep Tissue Pressure Injury - Purple or maroon localized area of discolored intact skin or blood-filled blister due to damage of underlying soft tissue from pressure and/or shear. DTI (Active)  08/14/21 0800  Location: Buttocks  Location  Orientation: Bilateral  Staging: Deep Tissue Pressure Injury - Purple or maroon localized area of discolored intact skin or blood-filled blister due to damage of underlying soft tissue from pressure and/or shear.  Wound Description (Comments): DTI  Present on Admission:     Consultants:  PCCM and ID  Procedures:  As above  Antimicrobials:  Anti-infectives (From admission, onward)    Start     Dose/Rate Route Frequency Ordered Stop   08/21/21 1300  ceFAZolin (ANCEF) IVPB 2g/100 mL premix        2 g 200 mL/hr over 30 Minutes Intravenous Every 8 hours 08/21/21 0918     08/21/21 1000  fluconazole (DIFLUCAN) tablet 400 mg  Status:  Discontinued        400 mg Per Tube Daily 08/20/21 1104 08/21/21 0925   08/21/21 1000  fluconazole (DIFLUCAN) IVPB 400 mg        400 mg 100 mL/hr over 120 Minutes Intravenous Every 24 hours 08/21/21 0925 08/27/21 0959   08/20/21 1200  fluconazole (DIFLUCAN) tablet 200 mg        200 mg Per Tube  Once 08/20/21 1104 08/20/21 1207   08/20/21 1000  fluconazole (DIFLUCAN) tablet 200 mg  Status:  Discontinued        200 mg Per Tube Daily 08/20/21 0825 08/20/21 1104   08/20/21 0047  Ampicillin-Sulbactam (UNASYN) 3 g in sodium chloride 0.9 % 100 mL IVPB  Status:  Discontinued        3 g 200 mL/hr over 30 Minutes Intravenous Every 12 hours 08/19/21 1619 08/19/21 1621   08/19/21 2100  Ampicillin-Sulbactam (UNASYN) 3 g in sodium chloride 0.9 % 100 mL IVPB  Status:  Discontinued        3 g 200 mL/hr over 30 Minutes Intravenous Every 8 hours 08/19/21 1621 08/21/21 0918   08/19/21 1200  Ampicillin-Sulbactam (UNASYN) 3 g in sodium chloride 0.9 % 100 mL IVPB  Status:  Discontinued        3 g 200 mL/hr over 30 Minutes Intravenous Every 8 hours 08/19/21 1055 08/19/21 1619   08/19/21 1015  fluconazole (DIFLUCAN) IVPB 200 mg  Status:  Discontinued        200 mg 100 mL/hr over 60 Minutes Intravenous Every 24 hours 08/19/21 0916 08/20/21 0825   08/17/21 2300  ceFAZolin (ANCEF)  IVPB 2g/100 mL premix  Status:  Discontinued        2 g 200 mL/hr over 30 Minutes Intravenous Every 12 hours 08/17/21 1054 08/19/21 1055   08/16/21 1000  fluconazole (DIFLUCAN) tablet 200 mg  Status:  Discontinued        200 mg Per Tube Daily 08/15/21 1414 08/19/21 0916   08/15/21 2314  Ampicillin-Sulbactam (UNASYN) 3 g in sodium chloride 0.9 % 100 mL IVPB  Status:  Discontinued        3 g 200 mL/hr over 30 Minutes Intravenous Every 12 hours 08/15/21 1410 08/17/21 1054   08/13/21 1000  fluconazole (DIFLUCAN) tablet 400 mg  Status:  Discontinued        400 mg Per Tube Daily 08/12/21 1148 08/15/21 1414   08/12/21 1000  fluconazole (DIFLUCAN) tablet 400 mg  Status:  Discontinued        400 mg Oral Daily 08/12/21 0913 08/12/21 1148   08/11/21 1800  vancomycin (VANCOREADY) IVPB 1250 mg/250 mL  Status:  Discontinued        1,250 mg 166.7 mL/hr over 90 Minutes Intravenous Every 24 hours 08/10/21 1651 08/11/21 0939   08/11/21 1030  ceFEPIme (MAXIPIME) 2 g in sodium chloride 0.9 % 100 mL IVPB  Status:  Discontinued        2 g 200 mL/hr over 30 Minutes Intravenous Every 12 hours 08/11/21 0936 08/11/21 0939   08/11/21 1030  Ampicillin-Sulbactam (UNASYN) 3 g in sodium chloride 0.9 % 100 mL IVPB  Status:  Discontinued        3 g 200 mL/hr over 30 Minutes Intravenous Every 6 hours 08/11/21 0940 08/15/21 1410   08/10/21 1730  vancomycin (VANCOREADY) IVPB 1750 mg/350 mL        1,750 mg 175 mL/hr over 120 Minutes Intravenous  Once 08/10/21 1651 08/10/21 1956   08/10/21 1715  piperacillin-tazobactam (ZOSYN) IVPB 3.375 g  Status:  Discontinued        3.375 g 12.5 mL/hr over 240 Minutes Intravenous Every 8 hours 08/10/21 1651 08/11/21 0936   08/06/21 2200  ceFAZolin (ANCEF) IVPB 2g/100 mL premix  Status:  Discontinued        2 g 200 mL/hr over 30 Minutes Intravenous Every 8 hours 08/06/21 1829 08/10/21 1631   08/06/21 1415  vancomycin (VANCOREADY) IVPB 1750 mg/350 mL  Status:  Discontinued        1,750  mg 175 mL/hr over 120 Minutes Intravenous Every 24 hours 08/06/21 1400 08/06/21 1829   08/06/21 1415  ceFEPIme (MAXIPIME) 2 g in sodium chloride 0.9 % 100 mL IVPB  Status:  Discontinued        2 g 200 mL/hr over 30 Minutes Intravenous Every 8 hours 08/06/21 1400 08/06/21 1829          Subjective:  Patient seen and examined.  He is saying that he is feeling a lot better.  His speech is also strong and fluent today.  He is not tremulous as he was yesterday.  Denies any shortness of breath or other complaint.   Objective: Vitals:   08/24/21 0802 08/24/21 0855 08/24/21 0910 08/24/21 1123  BP: (!) 178/85 (!) 151/78 (!) 165/87   Pulse: (!) 105 (!) 106 (!) 110   Resp: 18 20 (!) 22   Temp: 98.5 F (36.9 C) 98 F (36.7 C) 98 F (36.7 C) 98.2 F (36.8 C)  TempSrc: Temporal   Oral  SpO2: 97% 96% 94%   Weight: 92.5 kg     Height: 5\' 11"  (1.803 m)       Intake/Output Summary (Last 24 hours) at 08/24/2021 1308 Last data filed at 08/24/2021 1045 Gross per 24 hour  Intake 1584.88 ml  Output 1600 ml  Net -15.12 ml    Filed Weights   08/22/21 0500 08/23/21 0500 08/24/21 0802  Weight: 90.6 kg 92.5 kg 92.5 kg    Examination:  General exam: Appears calm and comfortable  Respiratory system: Clear to auscultation. Respiratory effort normal. Cardiovascular system: S1 & S2 heard, RRR. No JVD, murmurs, rubs, gallops or clicks. No pedal edema. Gastrointestinal system: Abdomen is nondistended, soft and nontender. No organomegaly or masses felt. Normal bowel sounds heard. Central nervous system: Alert and oriented. No focal neurological deficits. Extremities: Symmetric 5 x 5 power.   Data Reviewed: I have personally reviewed following labs and  imaging studies  CBC: Recent Labs  Lab 08/20/21 0327 08/20/21 1321 08/21/21 0515 08/21/21 0625 08/22/21 0814 08/22/21 0837 08/23/21 0450 08/23/21 1415 08/24/21 0435  WBC 21.1*  --  23.7*  --  22.5* 27.1* 18.8* 22.5* 16.6*  NEUTROABS  16.1*  --  18.9*  --   --   --   --   --   --   HGB 7.7*   < > 8.0*   < > 6.4* 7.3* 6.6* 8.5* 8.5*  HCT 23.4*   < > 24.4*   < > 20.9* 23.7* 21.1* 26.9* 27.4*  MCV 86.3  --  88.1  --  95.4 95.2 95.0 90.6 92.3  PLT 338  --  337  --  298 307 275 306 321   < > = values in this interval not displayed.    Basic Metabolic Panel: Recent Labs  Lab 08/20/21 0327 08/21/21 0515 08/21/21 0625 08/22/21 0530 08/23/21 0450 08/24/21 0435  NA 148* 152* 156* 139 149* 144  K 4.6 4.1 4.2 3.5 4.3 4.2  CL 118* 120*  --  111 120* 115*  CO2 22 23  --  21* 22 21*  GLUCOSE 258* 179*  --  436* 78 112*  BUN 114* 96*  --  76* 81* 70*  CREATININE 2.94* 2.26*  --  1.99* 2.21* 2.18*  CALCIUM 9.1 9.0  --  8.0* 8.2* 8.1*  PHOS 4.3 4.6  --  5.0* 5.7* 5.5*    GFR: Estimated Creatinine Clearance: 48.7 mL/min (A) (by C-G formula based on SCr of 2.18 mg/dL (H)). Liver Function Tests: Recent Labs  Lab 08/18/21 0334 08/19/21 0430 08/20/21 0327 08/21/21 0515 08/22/21 0530 08/23/21 0450 08/24/21 0435  AST 74* 107*  --   --   --   --   --   ALT 77* 46*  --   --   --   --   --   ALKPHOS 636* 596*  --   --   --   --   --   BILITOT 3.0* 2.4*  --   --   --   --   --   PROT 6.3* 6.1*  --   --   --   --   --   ALBUMIN 2.1* 2.3* 2.2* 2.1* 1.8* 1.8* 1.8*    No results for input(s): LIPASE, AMYLASE in the last 168 hours. No results for input(s): AMMONIA in the last 168 hours. Coagulation Profile: No results for input(s): INR, PROTIME in the last 168 hours. Cardiac Enzymes: No results for input(s): CKTOTAL, CKMB, CKMBINDEX, TROPONINI in the last 168 hours.  BNP (last 3 results) No results for input(s): PROBNP in the last 8760 hours. HbA1C: No results for input(s): HGBA1C in the last 72 hours. CBG: Recent Labs  Lab 08/23/21 2329 08/24/21 0335 08/24/21 0727 08/24/21 0855 08/24/21 1123  GLUCAP 222* 124* 124* 126* 131*    Lipid Profile: No results for input(s): CHOL, HDL, LDLCALC, TRIG, CHOLHDL,  LDLDIRECT in the last 72 hours.  Thyroid Function Tests: No results for input(s): TSH, T4TOTAL, FREET4, T3FREE, THYROIDAB in the last 72 hours. Anemia Panel: No results for input(s): VITAMINB12, FOLATE, FERRITIN, TIBC, IRON, RETICCTPCT in the last 72 hours. Sepsis Labs: Recent Labs  Lab 08/19/21 1412  PROCALCITON 0.51     No results found for this or any previous visit (from the past 240 hour(s)).      Radiology Studies: DG CHEST PORT 1 VIEW  Result Date: 08/23/2021 CLINICAL DATA:  Chest  pain. History of discitis and osteomyelitis in the thoracic spine. EXAM: PORTABLE CHEST 1 VIEW COMPARISON:  Chest x-ray 08/21/2021 FINDINGS: The right-sided PICC line is stable.  The feeding tube is stable. Persistent diffuse but asymmetric interstitial and airspace process in the lungs. No pleural effusions or pneumothorax. IMPRESSION: Persistent diffuse but asymmetric interstitial and airspace process. Electronically Signed   By: Marijo Sanes M.D.   On: 08/23/2021 15:09    Scheduled Meds:  [START ON 08/25/2021] amLODipine  5 mg Oral Daily   Chlorhexidine Gluconate Cloth  6 each Topical Daily   collagenase   Topical Daily   darbepoetin (ARANESP) injection - NON-DIALYSIS  150 mcg Subcutaneous Q Mon-1800   feeding supplement  237 mL Oral TID BM   feeding supplement (PROSource TF)  45 mL Per Tube BID   gabapentin  200 mg Oral BID   insulin aspart  0-20 Units Subcutaneous Q4H   insulin aspart  8 Units Subcutaneous Q4H   lidocaine  2 patch Transdermal Q24H   living well with diabetes book   Does not apply Once   mouth rinse  15 mL Mouth Rinse BID   melatonin  3 mg Oral QHS   metoprolol tartrate  25 mg Oral BID   mupirocin ointment  1 application Topical BID   pantoprazole  40 mg Oral BID   sodium chloride flush  10-40 mL Intracatheter Q12H   sucralfate  1 g Oral TID WC & HS   traZODone  100 mg Oral QHS   venlafaxine  37.5 mg Oral BID WC   Continuous Infusions:  sodium chloride 10 mL/hr at  08/24/21 0700    ceFAZolin (ANCEF) IV Stopped (08/24/21 2549)   feeding supplement (VITAL 1.5 CAL) 1,000 mL (08/24/21 1046)   fluconazole (DIFLUCAN) IV 400 mg (08/24/21 1053)   promethazine (PHENERGAN) injection (IM or IVPB) Stopped (08/24/21 0500)     LOS: 18 days   Time spent: 30 minutes   Darliss Cheney, MD [facility]  08/24/2021, 1:08 PM  Please page via [place] and do not message via secure chat for anything urgent. Secure chat can be used for anything non urgent.  How to contact the [facility] Attending or Consulting provider [place] or covering provider during after hours [place], for this patient?  Check the care team in [facility] and look for a) attending/consulting [facility] provider listed and b) the [facility] team listed. Page or secure chat [phone]. Log into [email or web address] and use [place]'s universal password to access. If you do not have the password, please contact the hospital operator. Locate the [facility] provider you are looking for under [facility] and page to a number that you can be directly reached. If you still have difficulty reaching the provider, please page the [facility] (Director on Call) for the Hospitalists listed on amion for assistance.

## 2021-08-24 NOTE — Progress Notes (Signed)
Physical Therapy Treatment Patient Details Name: Jose [name] MRN: [id number] DOB: Jan 03, 1976 Today's Date: 08/24/2021   History of Present Illness 45 y.o. male admitted on 08/06/2021 with worsening back pain, S/P T4-5 laminectomy, LT5 pediculotomy, and decompression of spinal cord on 10/14.  Pt with acute hypoxic respiratory failure in setting of recent GI bleed and recent spinal surgery. Endoscopy 10/17. Intubated 10/19-10/24. PMhx: T2DM, retinopathy, neuropathy, HTN, HLD, CKD, covid July 2022.    PT Comments    Pt agreeable to try sitting in the chair today though painful at 8/10 before getting fentanyl.  Emphasis on rolling, transitions, scooting, sitting balance, sit to stands with/without RW, transfer to chair.  Pt too painful to remain more than 10 minutes in the chair and was transferred back to the bed and positioned in supine.    Recommendations for follow up therapy are one component of a multi-disciplinary discharge planning process, led by the attending physician.  Recommendations may be updated based on patient status, additional functional criteria and insurance authorization.  Follow Up Recommendations  Acute inpatient rehab (3hours/day)     Assistance Recommended at Discharge Frequent or constant Supervision/Assistance  Equipment Recommendations  Other (comment)    Recommendations for Other Services       Precautions / Restrictions Precautions Precautions: Fall;Back Precaution Comments: cortrak     Mobility  Bed Mobility Overal bed mobility: Needs Assistance Bed Mobility: Rolling;Sidelying to Sit;Sit to Sidelying Rolling: Min assist Sidelying to sit: Mod assist     Sit to sidelying: Mod assist General bed mobility comments: cues for technique based on precautions, truncal and LE assist both in/out of bed.    Transfers Overall transfer level: Needs assistance Equipment used: None;Rolling walker (2 wheels) (IV pole) Transfers: Sit to/from [employer] Sit to Stand: Mod assist;+2 physical assistance Stand pivot transfers: Mod assist;+2 physical assistance         General transfer comment: cues for hand placement, assist for stability, forward and boost assist    Ambulation/Gait             General Gait Details: unable   Stairs             Wheelchair Mobility    Modified Rankin (Stroke Patients Only)       Balance Overall balance assessment: Needs assistance Sitting-balance support: Single extremity supported;Feet supported;Bilateral upper extremity supported Sitting balance-Leahy Scale: Poor Sitting balance - Comments: uses UE's for stability and to decrease pain.   Standing balance support: Bilateral upper extremity supported Standing balance-Leahy Scale: Poor Standing balance comment: pt unable to come fully upright without significant truncal/pelvic support and boost and use of the RW                            Cognition Arousal/Alertness: Awake/alert Behavior During Therapy: Flat affect Overall Cognitive Status: Within Functional Limits for tasks assessed                                          Exercises      General Comments General comments (skin integrity, edema, etc.): pt attempted to stay in the recliner, but only able to attain 8-10 min before asked to return to bed.      Pertinent Vitals/Pain Pain Assessment: 0-10 Pain Score: 8  (in chair--6/10 in bed) Pain Location: incision site between shoulder blades Pain  Descriptors / Indicators: Other (Comment);Sore;Guarding;Grimacing (Writhing) Pain Intervention(s): Limited activity within patient's tolerance;Monitored during session    Home Living                          Prior Function            PT Goals (current goals can now be found in the care plan section) Acute Rehab PT Goals Patient Stated Goal: less pain PT Goal Formulation: With patient Time For Goal Achievement:  08/26/21 Potential to Achieve Goals: Good Progress towards PT goals: Progressing toward goals    Frequency    Min 3X/week      PT Plan Current plan remains appropriate    Co-evaluation              AM-PAC PT "6 Clicks" Mobility   Outcome Measure  Help needed turning from your back to your side while in a flat bed without using bedrails?: A Lot Help needed moving from lying on your back to sitting on the side of a flat bed without using bedrails?: A Lot Help needed moving to and from a bed to a chair (including a wheelchair)?: Total Help needed standing up from a chair using your arms (e.g., wheelchair or bedside chair)?: Total Help needed to walk in hospital room?: Total Help needed climbing 3-5 steps with a railing? : Total 6 Click Score: 8    End of Session   Activity Tolerance: Patient limited by pain Patient left: in bed;with call bell/phone within reach;with bed alarm set Nurse Communication: Mobility status PT Visit Diagnosis: Other abnormalities of gait and mobility (R26.89);Muscle weakness (generalized) (M62.81);Pain Pain - part of body:  (back)     Time: 7591-6384 PT Time Calculation (min) (ACUTE ONLY): 26 min  Charges:  $Therapeutic Activity: 23-37 mins                     08/24/2021  Ginger Carne., PT Acute Rehabilitation Services [phone]  (pager) [phone]  (office)   Tessie Fass Israella Hubert 08/24/2021, 4:01 PM

## 2021-08-24 NOTE — Anesthesia Postprocedure Evaluation (Signed)
Anesthesia Post Note  Patient: Jose [name]  Procedure(s) Performed: ESOPHAGOGASTRODUODENOSCOPY (EGD) WITH PROPOFOL     Patient location during evaluation: Endoscopy Anesthesia Type: MAC Level of consciousness: awake Pain management: pain level controlled Vital Signs Assessment: post-procedure vital signs reviewed and stable Respiratory status: spontaneous breathing, nonlabored ventilation, respiratory function stable and patient connected to nasal cannula oxygen Cardiovascular status: stable and blood pressure returned to baseline Postop Assessment: no apparent nausea or vomiting Anesthetic complications: no   No notable events documented.  Last Vitals:  Vitals:   08/24/21 0910 08/24/21 1123  BP: (!) 165/87   Pulse: (!) 110   Resp: (!) 22   Temp: 36.7 C 36.8 C  SpO2: 94%     Last Pain:  Vitals:   08/24/21 1506  TempSrc:   PainSc: 6                  [name] P Sugey Trevathan

## 2021-08-24 NOTE — Transfer of Care (Signed)
Immediate Anesthesia Transfer of Care Note  Patient: TYCEN DOCKTER  Procedure(s) Performed: ESOPHAGOGASTRODUODENOSCOPY (EGD) WITH PROPOFOL  Patient Location: PACU  Anesthesia Type:MAC  Level of Consciousness: drowsy, patient cooperative and responds to stimulation  Airway & Oxygen Therapy: Patient Spontanous Breathing and Patient connected to nasal cannula oxygen  Post-op Assessment: Report given to RN and Post -op Vital signs reviewed and stable  Post vital signs: Reviewed and stable  Last Vitals:  Vitals Value Taken Time  BP 151/78 08/24/21 0855  Temp    Pulse 109 08/24/21 0857  Resp 21 08/24/21 0857  SpO2 95 % 08/24/21 0857  Vitals shown include unvalidated device data.  Last Pain:  Vitals:   08/24/21 0802  TempSrc: Temporal  PainSc: 7       Patients Stated Pain Goal: 2 (51/88/41 6606)  Complications: No notable events documented.

## 2021-08-24 NOTE — Anesthesia Preprocedure Evaluation (Signed)
Anesthesia Evaluation  Patient identified by MRN, date of birth, ID band Patient awake    Reviewed: Allergy & Precautions, NPO status , Patient's Chart, lab work & pertinent test results  Airway Mallampati: II  TM Distance: >3 FB Neck ROM: Full    Dental   Pulmonary former smoker,  Bilateral pleural effusions. On 6L [place]   breath sounds clear to auscultation       Cardiovascular hypertension,  Rhythm:Regular Rate:Tachycardia     Neuro/Psych  Neuromuscular disease    GI/Hepatic negative GI ROS, (+) Cirrhosis       ,   Endo/Other  diabetes  Renal/GU Renal disease     Musculoskeletal   Abdominal   Peds  Hematology  (+) anemia ,   Anesthesia Other Findings   Reproductive/Obstetrics                             Anesthesia Physical  Anesthesia Plan  ASA: 4  Anesthesia Plan: MAC   Post-op Pain Management:    Induction:   PONV Risk Score and Plan: 1 and Propofol infusion and Treatment may vary due to age or medical condition  Airway Management Planned: Natural Airway, Nasal Cannula and Simple Face Mask  Additional Equipment:   Intra-op Plan:   Post-operative Plan:   Informed Consent: I have reviewed the patients History and Physical, chart, labs and discussed the procedure including the risks, benefits and alternatives for the proposed anesthesia with the patient or authorized representative who has indicated his/her understanding and acceptance.       Plan Discussed with:   Anesthesia Plan Comments: ( Planning for MAC, however patient counseled on the possibility of requiring intubation and possibly even postop ventilation given his recent clinical course.)        Anesthesia Quick Evaluation

## 2021-08-24 NOTE — Brief Op Note (Signed)
08/06/2021 - 08/24/2021  8:55 AM  PATIENT:  Jose [name]  45 y.o. male  PRE-OPERATIVE DIAGNOSIS:  GI bleed  POST-OPERATIVE DIAGNOSIS:  esophagitis, no bleeding  PROCEDURE:  Procedure(s): ESOPHAGOGASTRODUODENOSCOPY (EGD) WITH PROPOFOL (N/A)  SURGEON:  Surgeon(s) and Role:    * Kayona Foor, MD - Primary  Findings ------------ -EGD showed improvement of esophagitis.  No evidence of gastric or duodenal ulcers.  Bilious fluid in the stomach.  No evidence of active bleeding.  Recommendations --------------------------- -Resume feeding at previous rate -Continue IV PPI twice daily while in hospital -No further inpatient GI work-up planned.  GI will sign off.  Call us back if needed  Otis Brace MD, FACP 08/24/2021, 8:56 AM  Contact #  [phone]

## 2021-08-24 NOTE — Op Note (Signed)
[facility] Patient Name: Jose [name] Procedure Date : 08/24/2021 MRN: [id number] Attending MD: Otis Brace , MD Date of Birth: 16-Oct-1976 CSN: [id number] Age: 45 Admit Type: Inpatient Procedure:                Upper GI endoscopy Indications:              Melena, Follow-up of esophagitis Providers:                Otis Brace, MD, Mariana Arn, Frazier Richards,                            Technician Referring MD:              Medicines:                Sedation Administered by an Anesthesia Professional Complications:            No immediate complications. Estimated Blood Loss:     Estimated blood loss was minimal. Procedure:                Pre-Anesthesia Assessment:                           - Prior to the procedure, a History and Physical                            was performed, and patient medications and                            allergies were reviewed. The patient's tolerance of                            previous anesthesia was also reviewed. The risks                            and benefits of the procedure and the sedation                            options and risks were discussed with the patient.                            All questions were answered, and informed consent                            was obtained. Prior Anticoagulants: The patient has                            taken no previous anticoagulant or antiplatelet                            agents. ASA Grade Assessment: IV - A patient with                            severe systemic disease that is a constant threat  to life. After reviewing the risks and benefits,                            the patient was deemed in satisfactory condition to                            undergo the procedure.                           After obtaining informed consent, the endoscope was                            passed under direct vision. Throughout the                             procedure, the patient's blood pressure, pulse, and                            oxygen saturations were monitored continuously. The                            [id number]-[id number] ([id number]) Olympus endoscope was introduced                            through the mouth, and advanced to the second part                            of duodenum. The upper GI endoscopy was                            accomplished without difficulty. The patient                            tolerated the procedure well. Scope In: Scope Out: Findings:      LA Grade D (one or more mucosal breaks involving at least 75% of       esophageal circumference) esophagitis with no bleeding was found in the       Mid and distal esophagus. Improved compared to last EGD.      A gastric tube was found in the entire esophagus.      Bilious fluid was found in the gastric body.      A gastric tube was found in the gastric body.      The cardia and gastric fundus were normal on retroflexion.      The duodenal bulb, first portion of the duodenum and second portion of       the duodenum were normal. Impression:               - LA Grade D erosive esophagitis with no bleeding.                           - A gastric tube was found in the esophagus.                           - Bilious gastric fluid.                           -  A gastric tube was found in the stomach.                           - Normal duodenal bulb, first portion of the                            duodenum and second portion of the duodenum.                           - No specimens collected. Recommendation:           - Return patient to hospital ward for ongoing care.                           - Resume previous diet.                           - Continue present medications. GI will sign off. Procedure Code(s):        --- Professional ---                           [phone], Esophagogastroduodenoscopy, flexible,                            transoral; diagnostic, including collection of                             specimen(s) by brushing or washing, when performed                            (separate procedure) Diagnosis Code(s):        --- Professional ---                           K20.80, Other esophagitis without bleeding                           T18.198A, Other foreign object in esophagus causing                            other injury, initial encounter                           Z93.1, Gastrostomy status                           K92.1, Melena (includes Hematochezia) CPT copyright 2019 [employer]. All rights reserved. The codes documented in this report are preliminary and upon coder review may  be revised to meet current compliance requirements. Otis Brace, MD Otis Brace, MD 08/24/2021 8:55:26 AM Number of Addenda: 0

## 2021-08-24 NOTE — Interval H&P Note (Signed)
History and Physical Interval Note:  08/24/2021 8:21 AM  Jose [name]  has presented today for surgery, with the diagnosis of GI bleed.  The various methods of treatment have been discussed with the patient and family. After consideration of risks, benefits and other options for treatment, the patient has consented to  Procedure(s): ESOPHAGOGASTRODUODENOSCOPY (EGD) WITH PROPOFOL (N/A) as a surgical intervention.  The patient's history has been reviewed, patient examined, no change in status, stable for surgery.  I have reviewed the patient's chart and labs.  Questions were answered to the patient's satisfaction.     Rosemarie Galvis

## 2021-08-25 DIAGNOSIS — M4624 Osteomyelitis of vertebra, thoracic region: Secondary | ICD-10-CM | POA: Diagnosis not present

## 2021-08-25 LAB — GLUCOSE, CAPILLARY
Glucose-Capillary: 99 mg/dL (ref 70–99)
Glucose-Capillary: 198 mg/dL — ABNORMAL HIGH (ref 70–99)
Glucose-Capillary: 237 mg/dL — ABNORMAL HIGH (ref 70–99)
Glucose-Capillary: 173 mg/dL — ABNORMAL HIGH (ref 70–99)
Glucose-Capillary: 128 mg/dL — ABNORMAL HIGH (ref 70–99)
Glucose-Capillary: 123 mg/dL — ABNORMAL HIGH (ref 70–99)

## 2021-08-25 LAB — RENAL FUNCTION PANEL
Albumin: 1.7 g/dL — ABNORMAL LOW (ref 3.5–5.0)
Anion gap: 7 (ref 5–15)
BUN: 60 mg/dL — ABNORMAL HIGH (ref 6–20)
CO2: 20 mmol/L — ABNORMAL LOW (ref 22–32)
Calcium: 8.3 mg/dL — ABNORMAL LOW (ref 8.9–10.3)
Chloride: 116 mmol/L — ABNORMAL HIGH (ref 98–111)
Creatinine, Ser: 2.02 mg/dL — ABNORMAL HIGH (ref 0.61–1.24)
GFR, Estimated: 40 mL/min — ABNORMAL LOW (ref >60)
Glucose, Bld: 128 mg/dL — ABNORMAL HIGH (ref 70–99)
Phosphorus: 3.9 mg/dL (ref 2.5–4.6)
Potassium: 4.3 mmol/L (ref 3.5–5.1)
Sodium: 143 mmol/L (ref 135–145)

## 2021-08-25 LAB — CBC WITH DIFFERENTIAL/PLATELET
Abs Immature Granulocytes: 0.05 10*3/uL (ref 0.00–0.07)
Basophils Absolute: 0.1 10*3/uL (ref 0.0–0.1)
Basophils Relative: 1 %
Eosinophils Absolute: 0.6 10*3/uL — ABNORMAL HIGH (ref 0.0–0.5)
Eosinophils Relative: 5 %
HCT: 27.1 % — ABNORMAL LOW (ref 39.0–52.0)
Hemoglobin: 8.5 g/dL — ABNORMAL LOW (ref 13.0–17.0)
Immature Granulocytes: 1 %
Lymphocytes Relative: 12 %
Lymphs Abs: 1.3 10*3/uL (ref 0.7–4.0)
MCH: 29.1 pg (ref 26.0–34.0)
MCHC: 31.4 g/dL (ref 30.0–36.0)
MCV: 92.8 fL (ref 80.0–100.0)
Monocytes Absolute: 1.1 10*3/uL — ABNORMAL HIGH (ref 0.1–1.0)
Monocytes Relative: 10 %
Neutro Abs: 7.8 10*3/uL — ABNORMAL HIGH (ref 1.7–7.7)
Neutrophils Relative %: 71 %
Platelets: 315 10*3/uL (ref 150–400)
RBC: 2.92 MIL/uL — ABNORMAL LOW (ref 4.22–5.81)
RDW: 18.7 % — ABNORMAL HIGH (ref 11.5–15.5)
WBC: 10.8 10*3/uL — ABNORMAL HIGH (ref 4.0–10.5)
nRBC: 0 % (ref 0.0–0.2)

## 2021-08-25 MED ORDER — METOPROLOL TARTRATE 50 MG PO TABS
50.0000 mg | ORAL_TABLET | Freq: Two times a day (BID) | ORAL | Status: DC
  Administered 2021-08-25 – 2021-08-30 (×10): 50 mg via ORAL
  Filled 2021-08-25 (×10): qty 1

## 2021-08-25 NOTE — Progress Notes (Signed)
Occupational Therapy Treatment Patient Details Name: Jose [name] MRN: [id number] DOB: Aug 21, 1976 Today's Date: 08/25/2021   History of present illness 45 y.o. male admitted on 08/06/2021 with worsening back pain, S/P T4-5 laminectomy, LT5 pediculotomy, and decompression of spinal cord on 10/14.  Pt with acute hypoxic respiratory failure in setting of recent GI bleed and recent spinal surgery. Endoscopy 10/17. Intubated 10/19-10/24. PMhx: T2DM, retinopathy, neuropathy, HTN, HLD, CKD, covid July 2022.   OT comments  Pt reports nausea finishing SLP session and starting OT session. Pt progressed to oob standing and side stepping toward HOB with RW. Pt reports fatigue and nausea and returning to supine instead of oob. Pt noted to have nausea medications throughout the day. Pt with blue emesis bag near by. Recommendation CIR at this time   Recommendations for follow up therapy are one component of a multi-disciplinary discharge planning process, led by the attending physician.  Recommendations may be updated based on patient status, additional functional criteria and insurance authorization.    Follow Up Recommendations  Acute inpatient rehab (3hours/day)    Assistance Recommended at Discharge Frequent or constant Supervision/Assistance  Equipment Recommendations  Other (comment)    Recommendations for Other Services Rehab consult    Precautions / Restrictions Precautions Precautions: Fall;Back Precaution Comments: reviewed back precautions for bed mobility cortrak       Mobility Bed Mobility Overal bed mobility: Needs Assistance Bed Mobility: Rolling;Sit to Supine;Supine to Sit Rolling: Min assist Sidelying to sit: Mod assist Supine to sit: Mod assist Sit to supine: Mod assist   General bed mobility comments: pt needs (A) to progress to side and mod cues for back precautions. pt static sitting min to min guard (A)> pt report increased back pain with sitting. pt progressed to  standing and then returning to supine with (A) for positioning and bil LE onto bed surface    Transfers Overall transfer level: Needs assistance Equipment used: Rolling walker (2 wheels) Transfers: Sit to/from Stand Sit to Stand: Min assist;From elevated surface           General transfer comment: bed surface was elevated to help with patient to stand from the bed surface. pt powered up and sustained standing for ~1 minute. pt returnned to sitting and then completed task again. pt on third attempt side stepping to the right toward [facility]     Balance Overall balance assessment: Needs assistance Sitting-balance support: Bilateral upper extremity supported;Feet supported Sitting balance-Leahy Scale: Fair     Standing balance support: Bilateral upper extremity supported;During functional activity Standing balance-Leahy Scale: Poor                             ADL either performed or assessed with clinical judgement   ADL Overall ADL's : Needs assistance/impaired   Eating/Feeding Details (indicate cue type and reason): SLP recommending diet     Upper Body Bathing: Moderate assistance   Lower Body Bathing: Maximal assistance   Upper Body Dressing : Moderate assistance   Lower Body Dressing: Maximal assistance                       Vision       Perception     Praxis      Cognition Arousal/Alertness: Awake/alert Behavior During Therapy: Flat affect Overall Cognitive Status: Within Functional Limits for tasks assessed  Exercises     Shoulder Instructions       General Comments      Pertinent Vitals/ Pain       Pain Assessment: Faces Faces Pain Scale: Hurts whole lot Pain Location: back Pain Descriptors / Indicators: Grimacing Pain Intervention(s): Premedicated before session;Repositioned  Home Living                                          Prior  Functioning/Environment              Frequency  Min 2X/week        Progress Toward Goals  OT Goals(current goals can now be found in the care plan section)  Progress towards OT goals: Progressing toward goals  Acute Rehab OT Goals OT Goal Formulation: With patient Time For Goal Achievement: 08/26/21 Potential to Achieve Goals: Fair ADL Goals Pt Will Perform Grooming: with supervision;sitting;bed level Pt Will Perform Lower Body Dressing: with modified independence;sit to/from stand Pt Will Transfer to Toilet: with mod assist;with +2 assist;bedside commode Pt Will Perform Toileting - Clothing Manipulation and hygiene: with modified independence;sit to/from stand Additional ADL Goal #1: Pt will complete bed mobility with min asisst +2 as precursor to ADLs.  Plan Frequency remains appropriate;Discharge plan remains appropriate    Co-evaluation                 AM-PAC OT "6 Clicks" Daily Activity     Outcome Measure   Help from another person eating meals?: A Little Help from another person taking care of personal grooming?: A Lot Help from another person toileting, which includes using toliet, bedpan, or urinal?: A Lot Help from another person bathing (including washing, rinsing, drying)?: A Lot Help from another person to put on and taking off regular upper body clothing?: A Lot Help from another person to put on and taking off regular lower body clothing?: A Lot 6 Click Score: 13    End of Session Equipment Utilized During Treatment: Oxygen  OT Visit Diagnosis: Other abnormalities of gait and mobility (R26.89);Muscle weakness (generalized) (M62.81);Pain   Activity Tolerance Patient tolerated treatment well   Patient Left in bed;with call bell/phone within reach;with bed alarm set;with nursing/sitter in room   Nurse Communication Mobility status        Time: 6712-4580 OT Time Calculation (min): 12 min  Charges: OT General Charges $OT Visit: 1  Visit OT Treatments $Self Care/Home Management : 8-22 mins   Brynn, OTR/L  Acute Rehabilitation Services Pager: [phone] Office: [phone] .   Jeri Modena 08/25/2021, 5:18 PM

## 2021-08-25 NOTE — Progress Notes (Signed)
Patient noted to be nauseated frequently throughout shift.  Having increased tremors of BUE as well, which appear to dissipate with combination of Ativan po and prescribed pain medication.

## 2021-08-25 NOTE — Progress Notes (Signed)
PROGRESS NOTE    Jose [name]  FTD:[id number] DOB: 1976/09/12 DOA: 08/06/2021 PCP: [name], NP   Brief Narrative:  Jose [name] is a 45 y.o. with a pertinent PMH of GAD, HTN, HLD, macular degeneration, T2DM, CKD (Stage 3), tobacco use disorder, hx of pleural effusion, recent admission for MSSA bacteremia/osteomyelitis who was discharged home with IV antibiotics on 08/03/2021, returned to the ED on 08/07/2021 with worsening back pain and admitted under [facility], now s/p T4-T5 laminectomy, left T5 pediculotomy, and spinal cord decompression. On 10/17 patient developed coffee-ground emesis, s/p EGD showing circumferential esophagitis with hemorrhage and non-bleeding duodenal ulcers.On 10/17, pccm was consulted for acute hypoxia.    Significant Events: 10/13 -admitted with worsening thoracic osteomyelitis, constipation/abd distention, neurosurgery consulted  10/14 - T4-T5 laminectomy, left T5 pediculotomy, and spinal cord decompression 10/17 - Hgb drop to 6.2, coffee ground emesis s/p EGD with esophagitis and peptic ulcer 10/17 - hypoxia and pccm consult  10/19 intubated 10/21 developed worsening kidney function, nephro consulted.  10/24 extubated Transferred under [facility] on 08/18/2021.  Assessment & Plan:   Principal Problem:   Osteomyelitis of thoracic spine (HCC) Active Problems:   Type 2 diabetes mellitus with diabetic polyneuropathy, without long-term current use of insulin (HCC)   Hyperlipidemia   GAD (generalized anxiety disorder)   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)   Essential hypertension   CKD (chronic kidney disease) stage 3, GFR 30-59 ml/min (HCC)   Pleural effusion, bilateral  Acute hypoxic respiratory failure/aspiration pneumonia, pneumonitis/MSSA bacteremia: Patient required intubation on 08/12/2021, extubated on 08/17/2021, oxygenation improved, currently on 2 L and maintaining oxygen saturation at 94%.  He recently completed course of Unasyn.  Chest x-ray  08/19/2019 shows persistent left lung pneumonia and worsening of atelectasis and consolidation at lung bases.  Procalcitonin improved though.  Antibiotics were switched to Unasyn but with improved procalcitonin, concern for aspiration pneumonia was very low and after discussing with pharmacy, we switched back to cefazolin since that has better coverage for bacteremia.  Breathing is improved.   Acute blood loss anemia/upper GI bleed/candidal esophagitis and PUD s/p EGD/possible PE: Patient had acute drop in hemoglobin from 8.1-5.9 within 24 hours on the morning of 08/19/2021, received 2 units of PRBC transfusion.  Hemoglobin improved to 7.7 but then dropped to 7.1 again early morning and he had a large melanotic stool so 1 unit of PRBC transfusion was ordered by night hospitalist. I consulted GI again on 08/21/2021.  They stopped his tube feedings for possible plan for EGD on 08/22/2021 and this plan was canceled despite of patient's hemoglobin dropping to 6.4.  Patient was given 1 unit of PRBC transfusion.  Patient's hemoglobin improved to 7.3 but dropped again to 6.6.  Patient had multiple melanotic BMs on the early morning of 08/23/2021. GI was notified.  GI recommended continuing Protonix.  Patient finally underwent repeat EGD 08/24/2021 which shows no active signs of bleeding and healing esophagitis.  Recommendation to continue Protonix and resume tube feedings.  Also his VQ scan cannot rule out PE on the left side, intermediate probability.  Doppler lower extremity and left upper extremity negative for any DVT.  PE cannot be ruled out completely but unfortunately due to recurrent acute blood loss anemia, we cannot initiate him on any anticoagulation.  Hopefully once his renal function improves, we will do CT angiogram of chest for definitive study but with negative DVT, patient with improved respiratory status despite of anticoagulation, chances of PE are much less now.  ATN on top of acute on chronic  kidney disease stage IIIa: baseline creatinine of 1.5-2.0.  Creatinine peaked at 4.5 on 08/17/2021, creatinine improved t and currently around 2.0.  Nephrology calls it at his baseline and signed off.     Hyperkalemia: Resolved.  Hyponatremia: Resolved.   Diabetes mellitus type 2 with hyperglycemia: Had hypoglycemic episodes due to his tube feedings on hold.  Semglee was discontinued due to hypoglycemic episodes.  Still very labile, within normal range this morning but now hyperglycemic.  Currently on NovoLog 8 units every 4 hours, we will continue that as well as SSI.   Shock liver: Continue conservative management LFTs are improving.   Sinus tachycardia: Had a transesophageal echo on 04/28/2021 that showed an EF of 60% no wall motion abnormality.  Before admission was on verapamil.  Has been started on Lopressor 25 mg p.o. twice daily, was tachycardic at 110 this morning, likely due to underlying anxiety.  Will increase Lopressor to 50 mg twice daily, blood pressure also elevated, Lopressor will help with that.   Essential hypertension: Blood pressure labile but fairly controlled.  Continue current dose of amlodipine, increase Lopressor.   Pleural effusion, bilateral Due to third spacing, he was giving albumin with good diuresis.   Sacral decubitus ulcer stage II present on admission: Wound care on board.  DVT prophylaxis: SCDs Start: 08/06/21 1536   Code Status: Full Code  Family Communication: Wife at bedside.  Status is: Inpatient  Remains inpatient appropriate because: Still very sick  Estimated body mass index is 28.66 kg/m as calculated from the following:   Height as of this encounter: 5\' 11"  (1.803 m).   Weight as of this encounter: 93.2 kg.  Pressure Injury 08/10/21 Coccyx Medial Stage 2 -  Partial thickness loss of dermis presenting as a shallow open injury with a red, pink wound bed without slough. (Active)  08/10/21 1700  Location: Coccyx (gluteal crease)  Location  Orientation: Medial  Staging: Stage 2 -  Partial thickness loss of dermis presenting as a shallow open injury with a red, pink wound bed without slough.  Wound Description (Comments):   Present on Admission: Yes     Pressure Injury 08/14/21 Buttocks Bilateral Deep Tissue Pressure Injury - Purple or maroon localized area of discolored intact skin or blood-filled blister due to damage of underlying soft tissue from pressure and/or shear. DTI (Active)  08/14/21 0800  Location: Buttocks  Location Orientation: Bilateral  Staging: Deep Tissue Pressure Injury - Purple or maroon localized area of discolored intact skin or blood-filled blister due to damage of underlying soft tissue from pressure and/or shear.  Wound Description (Comments): DTI  Present on Admission:     Nutritional Assessment: Body mass index is 28.66 kg/m.Marland Kitchen Seen by dietician.  I agree with the assessment and plan as outlined below: Nutrition Status: Nutrition Problem: Increased nutrient needs Etiology: post-op healing Signs/Symptoms: estimated needs Interventions: Ensure Enlive (each supplement provides 350kcal and 20 grams of protein), Tube feeding, Refer to RD note for recommendations  .  Skin Assessment: I have examined the patient's skin and I agree with the wound assessment as performed by the wound care RN as outlined below: Pressure Injury 08/10/21 Coccyx Medial Stage 2 -  Partial thickness loss of dermis presenting as a shallow open injury with a red, pink wound bed without slough. (Active)  08/10/21 1700  Location: Coccyx (gluteal crease)  Location Orientation: Medial  Staging: Stage 2 -  Partial thickness loss of dermis presenting as a  shallow open injury with a red, pink wound bed without slough.  Wound Description (Comments):   Present on Admission: Yes     Pressure Injury 08/14/21 Buttocks Bilateral Deep Tissue Pressure Injury - Purple or maroon localized area of discolored intact skin or blood-filled  blister due to damage of underlying soft tissue from pressure and/or shear. DTI (Active)  08/14/21 0800  Location: Buttocks  Location Orientation: Bilateral  Staging: Deep Tissue Pressure Injury - Purple or maroon localized area of discolored intact skin or blood-filled blister due to damage of underlying soft tissue from pressure and/or shear.  Wound Description (Comments): DTI  Present on Admission:     Consultants:  PCCM and ID  Procedures:  As above  Antimicrobials:  Anti-infectives (From admission, onward)    Start     Dose/Rate Route Frequency Ordered Stop   08/21/21 1300  ceFAZolin (ANCEF) IVPB 2g/100 mL premix        2 g 200 mL/hr over 30 Minutes Intravenous Every 8 hours 08/21/21 0918     08/21/21 1000  fluconazole (DIFLUCAN) tablet 400 mg  Status:  Discontinued        400 mg Per Tube Daily 08/20/21 1104 08/21/21 0925   08/21/21 1000  fluconazole (DIFLUCAN) IVPB 400 mg        400 mg 100 mL/hr over 120 Minutes Intravenous Every 24 hours 08/21/21 0925 08/27/21 0959   08/20/21 1200  fluconazole (DIFLUCAN) tablet 200 mg        200 mg Per Tube  Once 08/20/21 1104 08/20/21 1207   08/20/21 1000  fluconazole (DIFLUCAN) tablet 200 mg  Status:  Discontinued        200 mg Per Tube Daily 08/20/21 0825 08/20/21 1104   08/20/21 0047  Ampicillin-Sulbactam (UNASYN) 3 g in sodium chloride 0.9 % 100 mL IVPB  Status:  Discontinued        3 g 200 mL/hr over 30 Minutes Intravenous Every 12 hours 08/19/21 1619 08/19/21 1621   08/19/21 2100  Ampicillin-Sulbactam (UNASYN) 3 g in sodium chloride 0.9 % 100 mL IVPB  Status:  Discontinued        3 g 200 mL/hr over 30 Minutes Intravenous Every 8 hours 08/19/21 1621 08/21/21 0918   08/19/21 1200  Ampicillin-Sulbactam (UNASYN) 3 g in sodium chloride 0.9 % 100 mL IVPB  Status:  Discontinued        3 g 200 mL/hr over 30 Minutes Intravenous Every 8 hours 08/19/21 1055 08/19/21 1619   08/19/21 1015  fluconazole (DIFLUCAN) IVPB 200 mg  Status:   Discontinued        200 mg 100 mL/hr over 60 Minutes Intravenous Every 24 hours 08/19/21 0916 08/20/21 0825   08/17/21 2300  ceFAZolin (ANCEF) IVPB 2g/100 mL premix  Status:  Discontinued        2 g 200 mL/hr over 30 Minutes Intravenous Every 12 hours 08/17/21 1054 08/19/21 1055   08/16/21 1000  fluconazole (DIFLUCAN) tablet 200 mg  Status:  Discontinued        200 mg Per Tube Daily 08/15/21 1414 08/19/21 0916   08/15/21 2314  Ampicillin-Sulbactam (UNASYN) 3 g in sodium chloride 0.9 % 100 mL IVPB  Status:  Discontinued        3 g 200 mL/hr over 30 Minutes Intravenous Every 12 hours 08/15/21 1410 08/17/21 1054   08/13/21 1000  fluconazole (DIFLUCAN) tablet 400 mg  Status:  Discontinued        400 mg Per Tube Daily 08/12/21 1148  08/15/21 1414   08/12/21 1000  fluconazole (DIFLUCAN) tablet 400 mg  Status:  Discontinued        400 mg Oral Daily 08/12/21 0913 08/12/21 1148   08/11/21 1800  vancomycin (VANCOREADY) IVPB 1250 mg/250 mL  Status:  Discontinued        1,250 mg 166.7 mL/hr over 90 Minutes Intravenous Every 24 hours 08/10/21 1651 08/11/21 0939   08/11/21 1030  ceFEPIme (MAXIPIME) 2 g in sodium chloride 0.9 % 100 mL IVPB  Status:  Discontinued        2 g 200 mL/hr over 30 Minutes Intravenous Every 12 hours 08/11/21 0936 08/11/21 0939   08/11/21 1030  Ampicillin-Sulbactam (UNASYN) 3 g in sodium chloride 0.9 % 100 mL IVPB  Status:  Discontinued        3 g 200 mL/hr over 30 Minutes Intravenous Every 6 hours 08/11/21 0940 08/15/21 1410   08/10/21 1730  vancomycin (VANCOREADY) IVPB 1750 mg/350 mL        1,750 mg 175 mL/hr over 120 Minutes Intravenous  Once 08/10/21 1651 08/10/21 1956   08/10/21 1715  piperacillin-tazobactam (ZOSYN) IVPB 3.375 g  Status:  Discontinued        3.375 g 12.5 mL/hr over 240 Minutes Intravenous Every 8 hours 08/10/21 1651 08/11/21 0936   08/06/21 2200  ceFAZolin (ANCEF) IVPB 2g/100 mL premix  Status:  Discontinued        2 g 200 mL/hr over 30 Minutes  Intravenous Every 8 hours 08/06/21 1829 08/10/21 1631   08/06/21 1415  vancomycin (VANCOREADY) IVPB 1750 mg/350 mL  Status:  Discontinued        1,750 mg 175 mL/hr over 120 Minutes Intravenous Every 24 hours 08/06/21 1400 08/06/21 1829   08/06/21 1415  ceFEPIme (MAXIPIME) 2 g in sodium chloride 0.9 % 100 mL IVPB  Status:  Discontinued        2 g 200 mL/hr over 30 Minutes Intravenous Every 8 hours 08/06/21 1400 08/06/21 1829          Subjective:  Patient seen and examined.  Wife at the bedside.  Patient has no complaints.  Has some tachycardia but denies any chest pain or shortness of breath or in the complaint.  Objective: Vitals:   08/25/21 0314 08/25/21 0334 08/25/21 0741 08/25/21 1111  BP: 136/77  (!) 168/85 (!) 143/76  Pulse: 83  90 95  Resp: 16  20 18   Temp: 97.6 F (36.4 C)  98 F (36.7 C) 98.6 F (37 C)  TempSrc: Oral  Oral Oral  SpO2: 93%  94% 95%  Weight:  93.2 kg    Height:        Intake/Output Summary (Last 24 hours) at 08/25/2021 1131 Last data filed at 08/25/2021 0915 Gross per 24 hour  Intake 2648.31 ml  Output 1775 ml  Net 873.31 ml    Filed Weights   08/23/21 0500 08/24/21 0802 08/25/21 0334  Weight: 92.5 kg 92.5 kg 93.2 kg    Examination:  General exam: Appears calm and comfortable, looks very deconditioned Respiratory system: Rhonchi at the left base, respiratory effort normal. Cardiovascular system: S1 & S2 heard, sinus tachycardia. No JVD, murmurs, rubs, gallops or clicks. No pedal edema. Gastrointestinal system: Abdomen is nondistended, soft and nontender. No organomegaly or masses felt. Normal bowel sounds heard. Central nervous system: Alert and oriented. No focal neurological deficits. Extremities: Symmetric 5 x 5 power with global weakness all 4 extremities. Skin: Sacral ulcer Psychiatry: Judgement and insight appear normal.  Data Reviewed: I have personally reviewed following labs and imaging studies  CBC: Recent Labs  Lab  08/20/21 0327 08/20/21 1321 08/21/21 0515 08/21/21 0625 08/22/21 0837 08/23/21 0450 08/23/21 1415 08/24/21 0435 08/25/21 0525  WBC 21.1*  --  23.7*   < > 27.1* 18.8* 22.5* 16.6* 10.8*  NEUTROABS 16.1*  --  18.9*  --   --   --   --   --  7.8*  HGB 7.7*   < > 8.0*   < > 7.3* 6.6* 8.5* 8.5* 8.5*  HCT 23.4*   < > 24.4*   < > 23.7* 21.1* 26.9* 27.4* 27.1*  MCV 86.3  --  88.1   < > 95.2 95.0 90.6 92.3 92.8  PLT 338  --  337   < > 307 275 306 321 315   < > = values in this interval not displayed.    Basic Metabolic Panel: Recent Labs  Lab 08/21/21 0515 08/21/21 0625 08/22/21 0530 08/23/21 0450 08/24/21 0435 08/25/21 0525  NA 152* 156* 139 149* 144 143  K 4.1 4.2 3.5 4.3 4.2 4.3  CL 120*  --  111 120* 115* 116*  CO2 23  --  21* 22 21* 20*  GLUCOSE 179*  --  436* 78 112* 128*  BUN 96*  --  76* 81* 70* 60*  CREATININE 2.26*  --  1.99* 2.21* 2.18* 2.02*  CALCIUM 9.0  --  8.0* 8.2* 8.1* 8.3*  PHOS 4.6  --  5.0* 5.7* 5.5* 3.9    GFR: Estimated Creatinine Clearance: 52.8 mL/min (A) (by C-G formula based on SCr of 2.02 mg/dL (H)). Liver Function Tests: Recent Labs  Lab 08/19/21 0430 08/20/21 0327 08/21/21 0515 08/22/21 0530 08/23/21 0450 08/24/21 0435 08/25/21 0525  AST 107*  --   --   --   --   --   --   ALT 46*  --   --   --   --   --   --   ALKPHOS 596*  --   --   --   --   --   --   BILITOT 2.4*  --   --   --   --   --   --   PROT 6.1*  --   --   --   --   --   --   ALBUMIN 2.3*   < > 2.1* 1.8* 1.8* 1.8* 1.7*   < > = values in this interval not displayed.    No results for input(s): LIPASE, AMYLASE in the last 168 hours. No results for input(s): AMMONIA in the last 168 hours. Coagulation Profile: No results for input(s): INR, PROTIME in the last 168 hours. Cardiac Enzymes: No results for input(s): CKTOTAL, CKMB, CKMBINDEX, TROPONINI in the last 168 hours.  BNP (last 3 results) No results for input(s): PROBNP in the last 8760 hours. HbA1C: No results for  input(s): HGBA1C in the last 72 hours. CBG: Recent Labs  Lab 08/24/21 2017 08/24/21 2356 08/25/21 0316 08/25/21 0738 08/25/21 1109  GLUCAP 179* 158* 99 198* 237*    Lipid Profile: No results for input(s): CHOL, HDL, LDLCALC, TRIG, CHOLHDL, LDLDIRECT in the last 72 hours.  Thyroid Function Tests: No results for input(s): TSH, T4TOTAL, FREET4, T3FREE, THYROIDAB in the last 72 hours. Anemia Panel: No results for input(s): VITAMINB12, FOLATE, FERRITIN, TIBC, IRON, RETICCTPCT in the last 72 hours. Sepsis Labs: Recent Labs  Lab 08/19/21 1412  PROCALCITON 0.51  No results found for this or any previous visit (from the past 240 hour(s)).      Radiology Studies: DG CHEST PORT 1 VIEW  Result Date: 08/23/2021 CLINICAL DATA:  Chest pain. History of discitis and osteomyelitis in the thoracic spine. EXAM: PORTABLE CHEST 1 VIEW COMPARISON:  Chest x-ray 08/21/2021 FINDINGS: The right-sided PICC line is stable.  The feeding tube is stable. Persistent diffuse but asymmetric interstitial and airspace process in the lungs. No pleural effusions or pneumothorax. IMPRESSION: Persistent diffuse but asymmetric interstitial and airspace process. Electronically Signed   By: Marijo Sanes M.D.   On: 08/23/2021 15:09    Scheduled Meds:  amLODipine  5 mg Oral Daily   Chlorhexidine Gluconate Cloth  6 each Topical Daily   collagenase   Topical Daily   darbepoetin (ARANESP) injection - NON-DIALYSIS  150 mcg Subcutaneous Q Mon-1800   feeding supplement  237 mL Oral TID BM   feeding supplement (PROSource TF)  45 mL Per Tube BID   gabapentin  200 mg Oral BID   insulin aspart  0-20 Units Subcutaneous Q4H   insulin aspart  8 Units Subcutaneous Q4H   lidocaine  2 patch Transdermal Q24H   living well with diabetes book   Does not apply Once   mouth rinse  15 mL Mouth Rinse BID   melatonin  3 mg Oral QHS   metoprolol tartrate  25 mg Oral BID   mupirocin ointment  1 application Topical BID    pantoprazole  40 mg Oral BID   sodium chloride flush  10-40 mL Intracatheter Q12H   sucralfate  1 g Oral TID WC & HS   traZODone  100 mg Oral QHS   venlafaxine  37.5 mg Oral BID WC   Continuous Infusions:  sodium chloride Stopped (08/24/21 0749)    ceFAZolin (ANCEF) IV 2 g (08/25/21 0524)   feeding supplement (VITAL 1.5 CAL) 60 mL/hr at 08/25/21 0600   fluconazole (DIFLUCAN) IV Stopped (08/24/21 1253)   promethazine (PHENERGAN) injection (IM or IVPB) 25 mg (08/25/21 0622)     LOS: 19 days   Time spent: 29 minutes   Darliss Cheney, MD [facility]  08/25/2021, 11:31 AM  Please page via [facility] and do not message via secure chat for anything urgent. Secure chat can be used for anything non urgent.  How to contact the [facility] Attending or Consulting provider [place] or covering provider during after hours [place], for this patient?  Check the care team in [facility] and look for a) attending/consulting [facility] provider listed and b) the [facility] team listed. Page or secure chat [phone]. Log into [email or web address] and use [place]'s universal password to access. If you do not have the password, please contact the hospital operator. Locate the [facility] provider you are looking for under [facility] and page to a number that you can be directly reached. If you still have difficulty reaching the provider, please page the [facility] (Director on Call) for the Hospitalists listed on amion for assistance.

## 2021-08-25 NOTE — Progress Notes (Signed)
Inpatient Rehab Admissions Coordinator:  ? ?Per therapy recommendations,  patient was screened for CIR candidacy by Skyelar Swigart, MS, CCC-SLP. At this time, Pt. Appears to be a a potential candidate for CIR. I will place   order for rehab consult per protocol for full assessment. Please contact me any with questions. ? ?Quintyn Dombek, MS, CCC-SLP ?Rehab Admissions Coordinator  ?[phone] (celll) ?[phone] (office) ? ?

## 2021-08-25 NOTE — Progress Notes (Signed)
Speech Language Pathology Treatment: Dysphagia  Patient Details Name: LENFORD BEDDOW MRN: [id number] DOB: 03-Mar-1976 Today's Date: 08/25/2021 Time: 9379-0240 SLP Time Calculation (min) (ACUTE ONLY): 24 min  Assessment / Plan / Recommendation Clinical Impression  Followed up for diet tolerance and advancement. Pt eager for upgraded PO trials, notes displeasure to puree consistencies. Pt and RN report ongoing nausea, with prn meds utilized. He states mild episode of emesis this morning. Pt with xerostomia, improving post oral care. With upright positioning pt was without overt s/sx of aspiration with any PO this date (ice chips, thin liquids, softer solids). He does continue to appear weaker and deconditioned with increased risk for episodic swallow difficulty if not implementing safe swallow precautions. Recommend diet advancement to dysphagia 3 (mechanical soft) and thin liquids with meds in puree. Per MD and RD discretion consider calorie count and or nocturnal tube feeds to assist with PO intake. Anticipate diet upgrade this date will also improve PO intake, provided pt with dietary menu and encouraged to order preference PO items. SLP to continue to follow.    HPI HPI: 45 y.o. male admitted on 08/06/2021 with worsening back pain, constipation, abdominal distention . 10/17 incidence of coffee ground emesis with EGD revealing "esophagitis with no bleeding and a nonbleeding duodenal ulcer on Protonix". Pt developed acute hypoxic respiratory failure ETT 10/19-10/24. CXR (10/21) significant for "slightly improved extensive patchy airspace opacities in the left lung, compatible with pneumonia". PMhx: HTN, with a recent COVID-19 infection, macular degeneration, diabetes mellitus type 2,CKD stage III, recent admission for MSSA bacteremia/osteomyelitis status post T4-T5 laminectomy and spinal cord decompression on 08/07/2021.      SLP Plan  Continue with current plan of care      Recommendations for  follow up therapy are one component of a multi-disciplinary discharge planning process, led by the attending physician.  Recommendations may be updated based on patient status, additional functional criteria and insurance authorization.    Recommendations  Diet recommendations: Dysphagia 3 (mechanical soft);Thin liquid Liquids provided via: Cup;Straw Medication Administration: Whole meds with puree Supervision: Patient able to self feed;Staff to assist with self feeding;Intermittent supervision to cue for compensatory strategies Compensations: Minimize environmental distractions;Slow rate;Small sips/bites Postural Changes and/or Swallow Maneuvers: Seated upright 90 degrees;Upright 30-60 min after meal                Oral Care Recommendations: Oral care BID Follow up Recommendations: Other (comment) (TBD) Plan: Continue with current plan of care       [place], CCC-SLP Acute Rehabilitation Services    08/25/2021, 12:57 PM

## 2021-08-26 ENCOUNTER — Encounter (HOSPITAL_COMMUNITY): Payer: Self-pay | Admitting: Gastroenterology

## 2021-08-26 DIAGNOSIS — M4624 Osteomyelitis of vertebra, thoracic region: Secondary | ICD-10-CM | POA: Diagnosis not present

## 2021-08-26 LAB — RENAL FUNCTION PANEL
Albumin: 1.8 g/dL — ABNORMAL LOW (ref 3.5–5.0)
Anion gap: 8 (ref 5–15)
BUN: 52 mg/dL — ABNORMAL HIGH (ref 6–20)
CO2: 19 mmol/L — ABNORMAL LOW (ref 22–32)
Calcium: 8.3 mg/dL — ABNORMAL LOW (ref 8.9–10.3)
Chloride: 115 mmol/L — ABNORMAL HIGH (ref 98–111)
Creatinine, Ser: 1.83 mg/dL — ABNORMAL HIGH (ref 0.61–1.24)
GFR, Estimated: 45 mL/min — ABNORMAL LOW (ref >60)
Glucose, Bld: 207 mg/dL — ABNORMAL HIGH (ref 70–99)
Phosphorus: 3.5 mg/dL (ref 2.5–4.6)
Potassium: 4.3 mmol/L (ref 3.5–5.1)
Sodium: 142 mmol/L (ref 135–145)

## 2021-08-26 LAB — CBC
HCT: 28.4 % — ABNORMAL LOW (ref 39.0–52.0)
Hemoglobin: 8.7 g/dL — ABNORMAL LOW (ref 13.0–17.0)
MCH: 28.3 pg (ref 26.0–34.0)
MCHC: 30.6 g/dL (ref 30.0–36.0)
MCV: 92.5 fL (ref 80.0–100.0)
Platelets: 325 10*3/uL (ref 150–400)
RBC: 3.07 MIL/uL — ABNORMAL LOW (ref 4.22–5.81)
RDW: 18.3 % — ABNORMAL HIGH (ref 11.5–15.5)
WBC: 9.7 10*3/uL (ref 4.0–10.5)
nRBC: 0 % (ref 0.0–0.2)

## 2021-08-26 LAB — GLUCOSE, CAPILLARY
Glucose-Capillary: 188 mg/dL — ABNORMAL HIGH (ref 70–99)
Glucose-Capillary: 207 mg/dL — ABNORMAL HIGH (ref 70–99)
Glucose-Capillary: 207 mg/dL — ABNORMAL HIGH (ref 70–99)
Glucose-Capillary: 189 mg/dL — ABNORMAL HIGH (ref 70–99)
Glucose-Capillary: 125 mg/dL — ABNORMAL HIGH (ref 70–99)
Glucose-Capillary: 105 mg/dL — ABNORMAL HIGH (ref 70–99)

## 2021-08-26 MED ORDER — GLIMEPIRIDE 2 MG PO TABS
1.0000 mg | ORAL_TABLET | Freq: Every day | ORAL | Status: DC
Start: 2021-08-26 — End: 2021-08-27
  Administered 2021-08-26 – 2021-08-27 (×2): 1 mg via ORAL
  Filled 2021-08-26 (×2): qty 1

## 2021-08-26 NOTE — Progress Notes (Signed)
PROGRESS NOTE  Jose [name]  DOB: 07-Jun-1976  PCP: [name], NP TMH:[id number]  DOA: 08/06/2021  LOS: 20 days  Hospital Day: 21  Chief complaint: Worsening back pain  Brief narrative: Jose [name] is a 45 y.o. male with PMH significant for DM2 with complications including retinopathy, neuropathy, microalbuminuria; HTN, HLD, CKD 3 (baseline creatinine 1.1-1.2) who was last hospitalized from 9/28-10/10 for MSSA bacteremia/T4-T5 osteomyelitis and discharged home on IV antibiotics.  However he continued to have worsening back pain and was barely able to get up out of his bed and hence brought back to ED on 10/14.   MRI thoracic spine confirmed the finding and also showed a possible paraspinal phlegmon, interval T5 pathological fracture and new 4 mm of bony retropulsion.  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  There are also multiple small subcentimeter fluid collection in the left posterior paraspinal area suggestive of tiny abscesses.  Admitted to hospitalist service. Neurosurgery consultation was obtained. On 10/14, patient underwent a 45 laminectomy, left T5 pedicle ectomy and spinal cord decompression. His hospital course was also complicated by acute anemia requiring EGD, acute hypoxic respiratory failure requiring intubation, successfully extubated  10/17 - Hgb drop to 6.2, coffee ground emesis s/p EGD showed circumferential esophagitis, nonbleeding duodenal ulcers and old blood in the stomach 10/17 - hypoxia and pccm consult  10/19-10/24 -mechanical ventilation with successful extubation 10/25 - Transferred to [facility]  See below for details.   Subjective: Patient was seen and examined this morning.  Pleasant middle-aged Caucasian male.  Looks older for his age. Has core track in place.  Foley catheter in place. Physical therapy was about to work with him.  Assessment/Plan: Acute T4-T5  osteomyelitis MSSA bacteremia -Imaging and procedures as mentioned above. -Intra-Op culture grew MSSA -Per ID recommendation, patient is currently on IV Ancef.  Acute hypoxic respiratory failure Aspiration pneumonia -On 10/17, patient was noted to be hypoxic which worsened in he required intubation on 10/19, subsequently extubated on 10/24.  This was suspected to be due to aspiration pneumonia.  He completed a course of Unasyn. -Repeat chest x-ray on 10/30 showed persistent diffuse but asymmetric interstitial and airspace process. -Clinically patient has no fever, WBC count improved to normal but he continues to have crackles on left lung base. -Recent echo from 10/5 showed EF of 60 to 65%. -PE was also considered.  VQ scan showed intermediate probability.  DVT scan of lower extremities were negative.  Patient could not be anticoagulated empirically because of active GI bleeding. -Repeat chest x-ray tomorrow. Recent Labs  Lab 08/23/21 0450 08/23/21 1415 08/24/21 0435 08/25/21 0525 08/26/21 0345  WBC 18.8* 22.5* 16.6* 10.8* 9.7   Dysphagia Malnutrition -Probably related to esophagitis and generalized deconditioning.  Albumin level significantly low. -speech therapy following.  Currently has a core track in place.  Also on dysphagia 3 diet but has poor appetite. -Plan is to encourage oral appetite and remove core track in next 1 to 2 days. Recent Labs  Lab 08/22/21 0530 08/23/21 0450 08/24/21 0435 08/25/21 0525 08/26/21 0345  ALBUMIN 1.8* 1.8* 1.8* 1.7* 1.8*   Acute blood loss anemia Acute GI bleeding Candida esophagitis -EGD 10/17 showed showed circumferential esophagitis, nonbleeding duodenal ulcers and old blood in the stomach.  Esophageal biopsy confirmed Candida esophagitis.  Patient completed a course of Diflucan today 11/2. -Patient continued to have intermittent GI bleeding with drop in hemoglobin and hence underwent repeat EGD on  10/31.  It showed healing esophagitis  did not show any active signs of bleeding. -Currently on Protonix and tube feedings. -Received a total of 11 units of PRBC this hospitalization.  For the last 3 days, hemoglobin is stable between 8 and 9. -Continue to monitor Recent Labs    05/18/21 1703 05/19/21 0431 05/20/21 0359 07/22/21 1051 07/23/21 0508 07/24/21 0550 08/23/21 0450 08/23/21 1415 08/24/21 0435 08/25/21 0525 08/26/21 0345  HGB 11.2*   < > 9.0*   < > 8.6*   < > 6.6* 8.5* 8.5* 8.5* 8.7*  MCV 84.9   < > 87.3   < > 81.9   < > 95.0 90.6 92.3 92.8 92.5  VITAMINB12  --   --  1,734*  --  252  --   --   --   --   --   --   FOLATE  --   --  43.8  --  13.3  --   --   --   --   --   --   FERRITIN 167  --  126  --  193  --   --   --   --   --   --   TIBC  --   --  250  --  223*  --   --   --   --   --   --   IRON  --   --  59  --  21*  --   --   --   --   --   --   RETICCTPCT  --   --   --   --  1.1  --   --   --   --   --   --    < > = values in this interval not displayed.   AKI on CKD 3a -Baseline creatinine 1.1-1.2. -Creatinine worsened to peak at 4.53, probably because of ATN. Creatinine is gradually improving, 1.83 today.  Nephrology signed off. Recent Labs    08/19/21 0430 08/19/21 1520 08/19/21 2155 08/20/21 0327 08/21/21 0515 08/22/21 0530 08/23/21 0450 08/24/21 0435 08/25/21 0525 08/26/21 0345  BUN 130* 123* 119* 114* 96* 76* 81* 70* 60* 52*  CREATININE 3.53* 3.27* 3.00* 2.94* 2.26* 1.99* 2.21* 2.18* 2.02* 1.83*   Hypernatremia -Sodium level was at a peak of 156 on 10/28.  Resolved.  Currently in normal range. Recent Labs  Lab 08/19/21 1520 08/19/21 2155 08/20/21 0327 08/21/21 0515 08/21/21 0625 08/22/21 0530 08/23/21 0450 08/24/21 0435 08/25/21 0525 08/26/21 0345  NA 150* 149* 148* 152* 156* 139 149* 144 143 142   Type 2 diabetes mellitus Hypoglycemic episodes -A1c 7.5 on 8/18.  Patient had hypoglycemic episodes while tube feed was on hold. -Currently on insulin aspart 8 units every 4  hours, sliding scale insulin as well as glimepiride 1 mg daily. Recent Labs  Lab 08/25/21 1917 08/25/21 2327 08/26/21 0325 08/26/21 0734 08/26/21 1144  GLUCAP 128* 123* 188* 207* 207*   Essential hypertension -Blood pressure running mostly above 140s in last 24 hours. -Prior to admission, patient was on verapamil.  Currently on Lopressor 50 mg twice daily and amlodipine 5 mg daily.  Continue to monitor next 24 hours.  May need dose increment or additional agent.   Sacral decubitus ulcer stage II - POA -wound care on board.  Constipation -10/13, CT abdomen showed large burden of stool and stool balls throughout the colon  Acute urinary retention -CT abdomen on admission showed  distended urinary bladder with urinary retention, probably related to constipation.  Foley catheter was inserted.  Mobility: PT following.  Moderate assistance required for bed mobility.  CIR pending Living condition: Was living at home with his wife Goals of care:   Code Status: Full Code  Nutritional status: Body mass index is 28.17 kg/m. Nutrition Problem: Increased nutrient needs Etiology: post-op healing Signs/Symptoms: estimated needs Diet:  Diet Order             DIET DYS 3 Room service appropriate? Yes with Assist; Fluid consistency: Thin  Diet effective now                  DVT prophylaxis:  SCDs Start: 08/06/21 1536   Antimicrobials: IV Ancef Fluid: None Consultants: Neurosurgery, critical care, ID, nephrology signed off Family Communication: None at bedside  Status is: Inpatient  Remains inpatient appropriate because: As core track, Foley catheter, oxygen requirement, severe deconditioning  Dispo: The patient is from: Home              Anticipated d/c is to: CIR              Patient currently is not medically stable to d/c.   Difficult to place patient No     Infusions:   sodium chloride 10 mL/hr at 08/26/21 1010    ceFAZolin (ANCEF) IV 2 g (08/26/21 1337)   feeding  supplement (VITAL 1.5 CAL) 1,000 mL (08/26/21 0512)   promethazine (PHENERGAN) injection (IM or IVPB) 25 mg (08/25/21 1649)    Scheduled Meds:  amLODipine  5 mg Oral Daily   Chlorhexidine Gluconate Cloth  6 each Topical Daily   collagenase   Topical Daily   darbepoetin (ARANESP) injection - NON-DIALYSIS  150 mcg Subcutaneous Q Mon-1800   feeding supplement  237 mL Oral TID BM   feeding supplement (PROSource TF)  45 mL Per Tube BID   gabapentin  200 mg Oral BID   glimepiride  1 mg Oral Q breakfast   insulin aspart  0-20 Units Subcutaneous Q4H   insulin aspart  8 Units Subcutaneous Q4H   lidocaine  2 patch Transdermal Q24H   living well with diabetes book   Does not apply Once   mouth rinse  15 mL Mouth Rinse BID   metoprolol tartrate  50 mg Oral BID   mupirocin ointment  1 application Topical BID   pantoprazole  40 mg Oral BID   sodium chloride flush  10-40 mL Intracatheter Q12H   sucralfate  1 g Oral TID WC & HS   traZODone  100 mg Oral QHS   venlafaxine  37.5 mg Oral BID WC    PRN meds: sodium chloride, acetaminophen **OR** acetaminophen, albuterol, bisacodyl, fentaNYL (SUBLIMAZE) injection, hydrALAZINE, hydrOXYzine, lip balm, LORazepam, ondansetron (ZOFRAN) IV, polyethylene glycol, promethazine (PHENERGAN) injection (IM or IVPB), senna-docusate, sodium chloride flush, sodium phosphate   Antimicrobials: Anti-infectives (From admission, onward)    Start     Dose/Rate Route Frequency Ordered Stop   08/21/21 1300  ceFAZolin (ANCEF) IVPB 2g/100 mL premix        2 g 200 mL/hr over 30 Minutes Intravenous Every 8 hours 08/21/21 0918     08/21/21 1000  fluconazole (DIFLUCAN) tablet 400 mg  Status:  Discontinued        400 mg Per Tube Daily 08/20/21 1104 08/21/21 0925   08/21/21 1000  fluconazole (DIFLUCAN) IVPB 400 mg        400 mg 100 mL/hr over 120 Minutes  Intravenous Every 24 hours 08/21/21 0925 08/26/21 1059   08/20/21 1200  fluconazole (DIFLUCAN) tablet 200 mg        200 mg  Per Tube  Once 08/20/21 1104 08/20/21 1207   08/20/21 1000  fluconazole (DIFLUCAN) tablet 200 mg  Status:  Discontinued        200 mg Per Tube Daily 08/20/21 0825 08/20/21 1104   08/20/21 0047  Ampicillin-Sulbactam (UNASYN) 3 g in sodium chloride 0.9 % 100 mL IVPB  Status:  Discontinued        3 g 200 mL/hr over 30 Minutes Intravenous Every 12 hours 08/19/21 1619 08/19/21 1621   08/19/21 2100  Ampicillin-Sulbactam (UNASYN) 3 g in sodium chloride 0.9 % 100 mL IVPB  Status:  Discontinued        3 g 200 mL/hr over 30 Minutes Intravenous Every 8 hours 08/19/21 1621 08/21/21 0918   08/19/21 1200  Ampicillin-Sulbactam (UNASYN) 3 g in sodium chloride 0.9 % 100 mL IVPB  Status:  Discontinued        3 g 200 mL/hr over 30 Minutes Intravenous Every 8 hours 08/19/21 1055 08/19/21 1619   08/19/21 1015  fluconazole (DIFLUCAN) IVPB 200 mg  Status:  Discontinued        200 mg 100 mL/hr over 60 Minutes Intravenous Every 24 hours 08/19/21 0916 08/20/21 0825   08/17/21 2300  ceFAZolin (ANCEF) IVPB 2g/100 mL premix  Status:  Discontinued        2 g 200 mL/hr over 30 Minutes Intravenous Every 12 hours 08/17/21 1054 08/19/21 1055   08/16/21 1000  fluconazole (DIFLUCAN) tablet 200 mg  Status:  Discontinued        200 mg Per Tube Daily 08/15/21 1414 08/19/21 0916   08/15/21 2314  Ampicillin-Sulbactam (UNASYN) 3 g in sodium chloride 0.9 % 100 mL IVPB  Status:  Discontinued        3 g 200 mL/hr over 30 Minutes Intravenous Every 12 hours 08/15/21 1410 08/17/21 1054   08/13/21 1000  fluconazole (DIFLUCAN) tablet 400 mg  Status:  Discontinued        400 mg Per Tube Daily 08/12/21 1148 08/15/21 1414   08/12/21 1000  fluconazole (DIFLUCAN) tablet 400 mg  Status:  Discontinued        400 mg Oral Daily 08/12/21 0913 08/12/21 1148   08/11/21 1800  vancomycin (VANCOREADY) IVPB 1250 mg/250 mL  Status:  Discontinued        1,250 mg 166.7 mL/hr over 90 Minutes Intravenous Every 24 hours 08/10/21 1651 08/11/21 0939    08/11/21 1030  ceFEPIme (MAXIPIME) 2 g in sodium chloride 0.9 % 100 mL IVPB  Status:  Discontinued        2 g 200 mL/hr over 30 Minutes Intravenous Every 12 hours 08/11/21 0936 08/11/21 0939   08/11/21 1030  Ampicillin-Sulbactam (UNASYN) 3 g in sodium chloride 0.9 % 100 mL IVPB  Status:  Discontinued        3 g 200 mL/hr over 30 Minutes Intravenous Every 6 hours 08/11/21 0940 08/15/21 1410   08/10/21 1730  vancomycin (VANCOREADY) IVPB 1750 mg/350 mL        1,750 mg 175 mL/hr over 120 Minutes Intravenous  Once 08/10/21 1651 08/10/21 1956   08/10/21 1715  piperacillin-tazobactam (ZOSYN) IVPB 3.375 g  Status:  Discontinued        3.375 g 12.5 mL/hr over 240 Minutes Intravenous Every 8 hours 08/10/21 1651 08/11/21 0936   08/06/21 2200  ceFAZolin (ANCEF) IVPB  2g/100 mL premix  Status:  Discontinued        2 g 200 mL/hr over 30 Minutes Intravenous Every 8 hours 08/06/21 1829 08/10/21 1631   08/06/21 1415  vancomycin (VANCOREADY) IVPB 1750 mg/350 mL  Status:  Discontinued        1,750 mg 175 mL/hr over 120 Minutes Intravenous Every 24 hours 08/06/21 1400 08/06/21 1829   08/06/21 1415  ceFEPIme (MAXIPIME) 2 g in sodium chloride 0.9 % 100 mL IVPB  Status:  Discontinued        2 g 200 mL/hr over 30 Minutes Intravenous Every 8 hours 08/06/21 1400 08/06/21 1829       Objective: Vitals:   08/26/21 0736 08/26/21 1146  BP: (!) 164/94 (!) 148/85  Pulse: 96 86  Resp: 20 20  Temp: 97.6 F (36.4 C) 97.7 F (36.5 C)  SpO2: 93% 92%    Intake/Output Summary (Last 24 hours) at 08/26/2021 1416 Last data filed at 08/26/2021 1022 Gross per 24 hour  Intake 1740.97 ml  Output 2400 ml  Net -659.03 ml   Filed Weights   08/24/21 0802 08/25/21 0334 08/26/21 0419  Weight: 92.5 kg 93.2 kg 91.6 kg   Weight change: -0.873 kg Body mass index is 28.17 kg/m.   Physical Exam: General exam: Pleasant middle-aged Caucasian male.  Looks older for his age. Skin: No rashes, lesions or ulcers. HEENT:  Atraumatic, normocephalic, no obvious bleeding Lungs: Has coarse crackles on left lower base CVS: Regular rate and rhythm, no murmur GI/Abd soft, nontender, nondistended, bowels are present CNS: Alert, awake, oriented x3 Psychiatry: Mood appropriate Extremities: No pedal edema, no calf tenderness  Data Review: I have personally reviewed the laboratory data and studies available.  F/u labs ordered Unresulted Labs (From admission, onward)     Start     Ordered   08/27/21 0500  CBC with Differential/Platelet  Daily,   R     Question:  Specimen collection method  Answer:  Unit=Unit collect   08/26/21 1415   08/27/21 9030  Basic metabolic panel  Daily,   R     Question:  Specimen collection method  Answer:  Unit=Unit collect   08/26/21 1415   08/27/21 0500  Magnesium  Tomorrow morning,   STAT       Question:  Specimen collection method  Answer:  Unit=Unit collect   08/26/21 1415   08/27/21 0500  Phosphorus  Tomorrow morning,   R       Question:  Specimen collection method  Answer:  Unit=Unit collect   08/26/21 1415            Signed, Terrilee Croak, MD [facility] 08/26/2021

## 2021-08-26 NOTE — Plan of Care (Signed)

## 2021-08-26 NOTE — Progress Notes (Signed)
Speech Language Pathology Treatment: Dysphagia  Patient Details Name: Jose [name] MRN: [id number] DOB: September 20, 1976 Today's Date: 08/26/2021 Time: 7127-8718 SLP Time Calculation (min) (ACUTE ONLY): 24 min  Assessment / Plan / Recommendation Clinical Impression  Pt on therapist's list to see today in addition to MD requesting guidance re: pt's swallow status/continued need for alternate means in light of emesis and decreased intake. Pt reported no difficulty with lunch however indicates intake limited d/t nausea and changes in taste. SLP observed pt with current diet Dys3/thin liquid diet. Oropharyngeal swallow abilities appear adequate and there were no impairments and no overt s/s of penetration/aspiration across consistencies. Main issue in re: to po's appears to be n/v. Recommend upgrade to regular/thin diet administering medications whole with thin liquids and pt ordering foods he could tolerate. Reviewed menu and ordering food. Told pt wife could bring food from outside if MD agrees after pt asked if this would be allowed. Would a calorie count be beneficial? Pt requires no further acute ST services.    HPI HPI: 45 y.o. male admitted on 08/06/2021 with worsening back pain, constipation, abdominal distention . 10/17 incidence of coffee ground emesis with EGD revealing "esophagitis with no bleeding and a nonbleeding duodenal ulcer on Protonix". Pt developed acute hypoxic respiratory failure ETT 10/19-10/24. CXR (10/21) significant for "slightly improved extensive patchy airspace opacities in the left lung, compatible with pneumonia". PMhx: HTN, with a recent COVID-19 infection, macular degeneration, diabetes mellitus type 2,CKD stage III, recent admission for MSSA bacteremia/osteomyelitis status post T4-T5 laminectomy and spinal cord decompression on 08/07/2021.      SLP Plan  Continue with current plan of care;All goals met      Recommendations for follow up therapy are one component of a  multi-disciplinary discharge planning process, led by the attending physician.  Recommendations may be updated based on patient status, additional functional criteria and insurance authorization.    Recommendations  Diet recommendations: Regular;Thin liquid Liquids provided via: Cup;Straw Medication Administration: Whole meds with liquid Supervision: Patient able to self feed Compensations: Slow rate Postural Changes and/or Swallow Maneuvers: Seated upright 90 degrees;Upright 30-60 min after meal                Oral Care Recommendations: Oral care BID Follow up Recommendations: None SLP Visit Diagnosis: Dysphagia, unspecified (R13.10) Plan: Continue with current plan of care;All goals met       GO               Dewitt Rota, SLP-Student  Dewitt Rota  08/26/2021, 3:58 PM

## 2021-08-26 NOTE — Progress Notes (Signed)
Inpatient Rehabilitation Admissions Coordinator   Inpatient rehab consult received I met with patient at bedside but he is very sleepy after receiving meds for nausea. I will return for full rehab assessment.  Danne Baxter, RN, MSN Rehab Admissions Coordinator [phone] 08/26/2021 1:16 PM

## 2021-08-26 NOTE — Progress Notes (Signed)
Physical Therapy Treatment Patient Details Name: Jose [name] MRN: [id number] DOB: Dec 25, 1975 Today's Date: 08/26/2021   History of Present Illness 45 y.o. male admitted on 08/06/2021 with worsening back pain, S/P T4-5 laminectomy, LT5 pediculotomy, and decompression of spinal cord on 10/14.  Pt with acute hypoxic respiratory failure in setting of recent GI bleed and recent spinal surgery. Endoscopy 10/17. Intubated 10/19-10/24. PMhx: T2DM, retinopathy, neuropathy, HTN, HLD, CKD, covid July 2022.    PT Comments    Patient limited by back pain and nausea throughout session. Patient on 4L O2 [place] during session with spO2 93-94%. Patient requires min-modA+2 for bed mobility. Able to transfer to bed<>chair with modA+2 and RW. Unable to tolerate sitting in recliner >5 minutes. Educated patient on keeping Sarben >30 degrees due to cortrak and on HEP to promote strengthening in bilateral LE, patient verbalized understanding. Continue to recommend comprehensive inpatient rehab (CIR) for post-acute therapy needs.    Recommendations for follow up therapy are one component of a multi-disciplinary discharge planning process, led by the attending physician.  Recommendations may be updated based on patient status, additional functional criteria and insurance authorization.  Follow Up Recommendations  Acute inpatient rehab (3hours/day)     Assistance Recommended at Discharge Frequent or constant Supervision/Assistance  Equipment Recommendations  Rolling Tenecia Ignasiak (2 wheels)    Recommendations for Other Services       Precautions / Restrictions Precautions Precautions: Fall;Back Precaution Comments: reviewed back precautions for bed mobility, cortrak Restrictions Weight Bearing Restrictions: No     Mobility  Bed Mobility Overal bed mobility: Needs Assistance Bed Mobility: Rolling;Sidelying to Sit;Sit to Supine Rolling: Min assist Sidelying to sit: Min assist;+2 for physical assistance;+2 for  safety/equipment   Sit to supine: Mod assist;+2 for physical assistance;+2 for safety/equipment   General bed mobility comments: requires cues for log roll technique and assist for hand placement. Assist for trunk elevation to sitting EOB and return to supine. Statically sitting with min guard with intermittent minA    Transfers Overall transfer level: Needs assistance Equipment used: Rolling Lillis Nuttle (2 wheels) Transfers: Sit to/from Stand Sit to Stand: Min assist;+2 physical assistance;+2 safety/equipment;From elevated surface Stand pivot transfers: Mod assist;+2 physical assistance         General transfer comment: EOB partially elevated with patient able to stand with minA+2. ModA+2 for transfer to chair<>bed. Patient reports back pain in sitting and unable to stay in chair >5 minutes.    Ambulation/Gait                 Stairs             Wheelchair Mobility    Modified Rankin (Stroke Patients Only)       Balance Overall balance assessment: Needs assistance Sitting-balance support: Bilateral upper extremity supported;Feet supported Sitting balance-Leahy Scale: Fair     Standing balance support: Bilateral upper extremity supported;During functional activity Standing balance-Leahy Scale: Poor                              Cognition Arousal/Alertness: Awake/alert Behavior During Therapy: Flat affect Overall Cognitive Status: Within Functional Limits for tasks assessed                                          Exercises General Exercises - Lower Extremity Ankle Circles/Pumps: Both;10 reps;Supine Heel Slides: Both;5 reps;Supine Hip  ABduction/ADduction: Both;5 reps;Supine Straight Leg Raises: Both;5 reps;Supine    General Comments General comments (skin integrity, edema, etc.): on 4L O2 with spO2 maintaining 93-94% during session      Pertinent Vitals/Pain Pain Assessment: Faces Faces Pain Scale: Hurts whole lot Pain  Location: back Pain Descriptors / Indicators: Grimacing;Restless Pain Intervention(s): Monitored during session;Repositioned    Home Living                          Prior Function            PT Goals (current goals can now be found in the care plan section) Acute Rehab PT Goals Patient Stated Goal: less pain PT Goal Formulation: With patient Time For Goal Achievement: 09/09/21 Potential to Achieve Goals: Good Progress towards PT goals: Progressing toward goals    Frequency    Min 3X/week      PT Plan Current plan remains appropriate    Co-evaluation PT/OT/SLP Co-Evaluation/Treatment: Yes Reason for Co-Treatment: For patient/therapist safety;To address functional/ADL transfers PT goals addressed during session: Mobility/safety with mobility;Balance;Strengthening/ROM        AM-PAC PT "6 Clicks" Mobility   Outcome Measure  Help needed turning from your back to your side while in a flat bed without using bedrails?: A Lot Help needed moving from lying on your back to sitting on the side of a flat bed without using bedrails?: A Lot Help needed moving to and from a bed to a chair (including a wheelchair)?: Total Help needed standing up from a chair using your arms (e.g., wheelchair or bedside chair)?: Total Help needed to walk in hospital room?: Total Help needed climbing 3-5 steps with a railing? : Total 6 Click Score: 8    End of Session Equipment Utilized During Treatment: Oxygen Activity Tolerance: Patient limited by pain Patient left: in bed;with call bell/phone within reach Nurse Communication: Mobility status PT Visit Diagnosis: Other abnormalities of gait and mobility (R26.89);Muscle weakness (generalized) (M62.81);Pain Pain - part of body:  (back)     Time: 8588-5027 PT Time Calculation (min) (ACUTE ONLY): 32 min  Charges:  $Therapeutic Activity: 8-22 mins                     Adreana Coull A. Gilford Rile PT, DPT Acute Rehabilitation Services Pager  [phone] Office [phone]    Linna Hoff 08/26/2021, 1:11 PM

## 2021-08-26 NOTE — Progress Notes (Signed)
Occupational Therapy Treatment Patient Details Name: Jose [name] MRN: [id number] DOB: [birth year] Today's Date: 08/26/2021   History of present illness 45 y.o. male admitted on 08/06/2021 with worsening back pain, S/P T4-5 laminectomy, LT5 pediculotomy, and decompression of spinal cord on 10/14.  Pt with acute hypoxic respiratory failure in setting of recent GI bleed and recent spinal surgery. Endoscopy 10/17. Intubated 10/19-10/24. PMhx: T2DM, retinopathy, neuropathy, HTN, HLD, CKD, covid July 2022.   OT comments  Pt progressed from bed to chair this session with two person (A) and RW. Pt with pain increased to 8 out 10. Pt reports vomiting prior to OT session. Pt holding blue bag entire session for comfort. Pt burping a lot after sitting up and reports some minimal relief. Recommendation CIR at this time.     Recommendations for follow up therapy are one component of a multi-disciplinary discharge planning process, led by the attending physician.  Recommendations may be updated based on patient status, additional functional criteria and insurance authorization.    Follow Up Recommendations  Acute inpatient rehab (3hours/day)    Assistance Recommended at Discharge Frequent or constant Supervision/Assistance  Equipment Recommendations  Other (comment)    Recommendations for Other Services Rehab consult    Precautions / Restrictions Precautions Precautions: Fall;Back Precaution Comments: reviewed back precautions for bed mobility, cortrak       Mobility Bed Mobility Overal bed mobility: Needs Assistance Bed Mobility: Rolling;Sidelying to Sit;Sit to Supine Rolling: Min assist Sidelying to sit: Min assist;+2 for physical assistance;+2 for safety/equipment   Sit to supine: Mod assist;+2 for physical assistance;+2 for safety/equipment   General bed mobility comments: requires cues for log roll technique and assist for hand placement. Assist for trunk elevation to sitting EOB and  return to supine. Statically sitting with min guard with intermittent minA    Transfers Overall transfer level: Needs assistance Equipment used: Rolling walker (2 wheels) Transfers: Sit to/from Stand Sit to Stand: Min assist;+2 physical assistance;+2 safety/equipment;From elevated surface Stand pivot transfers: Mod assist;+2 physical assistance         General transfer comment: EOB partially elevated with patient able to stand with minA+2. ModA+2 for transfer to chair<>bed. Patient reports back pain in sitting and unable to stay in chair >5 minutes.     Balance Overall balance assessment: Needs assistance Sitting-balance support: Bilateral upper extremity supported;Feet supported Sitting balance-Leahy Scale: Fair     Standing balance support: Bilateral upper extremity supported;During functional activity Standing balance-Leahy Scale: Poor                             ADL either performed or assessed with clinical judgement   ADL Overall ADL's : Needs assistance/impaired                                             Vision       Perception     Praxis      Cognition Arousal/Alertness: Awake/alert Behavior During Therapy: Flat affect Overall Cognitive Status: No family/caregiver present to determine baseline cognitive functioning                               Problem Solving: Slow processing            Exercises Exercises: General Lower Extremity Other  Exercises Other Exercises: LB exercises reviewed. Other Exercises: educated on Shoulder flexion, scapula movement in all planes   Shoulder Instructions       General Comments [place] 4L O2    Pertinent Vitals/ Pain       Pain Assessment: Faces Faces Pain Scale: Hurts whole lot Pain Location: back Pain Descriptors / Indicators: Grimacing;Restless Pain Intervention(s): Limited activity within patient's tolerance;Premedicated before session;Repositioned  Home Living                                           Prior Functioning/Environment              Frequency  Min 2X/week        Progress Toward Goals  OT Goals(current goals can now be found in the care plan section)  Progress towards OT goals: Progressing toward goals  Acute Rehab OT Goals OT Goal Formulation: With patient Time For Goal Achievement: 09/09/21 Potential to Achieve Goals: Fair ADL Goals Pt Will Perform Grooming: with supervision;sitting;bed level Pt Will Perform Lower Body Dressing: with modified independence;sit to/from stand Pt Will Transfer to Toilet: with mod assist;bedside commode Pt Will Perform Toileting - Clothing Manipulation and hygiene: with modified independence;sit to/from stand Additional ADL Goal #1: Pt will complete bed mobility with min asisst +2 as precursor to ADLs.  Plan Frequency remains appropriate;Discharge plan remains appropriate    Co-evaluation    PT/OT/SLP Co-Evaluation/Treatment: Yes Reason for Co-Treatment: Complexity of the patient's impairments (multi-system involvement);Necessary to address cognition/behavior during functional activity;For patient/therapist safety;To address functional/ADL transfers   OT goals addressed during session: ADL's and self-care;Proper use of Adaptive equipment and DME;Strengthening/ROM      AM-PAC OT "6 Clicks" Daily Activity     Outcome Measure   Help from another person eating meals?: A Little Help from another person taking care of personal grooming?: A Lot Help from another person toileting, which includes using toliet, bedpan, or urinal?: A Lot Help from another person bathing (including washing, rinsing, drying)?: A Lot Help from another person to put on and taking off regular upper body clothing?: A Lot Help from another person to put on and taking off regular lower body clothing?: A Lot 6 Click Score: 13    End of Session Equipment Utilized During Treatment: Oxygen  OT Visit  Diagnosis: Other abnormalities of gait and mobility (R26.89);Muscle weakness (generalized) (M62.81);Pain   Activity Tolerance Patient tolerated treatment well   Patient Left in bed;with call bell/phone within reach;with bed alarm set;with nursing/sitter in room   Nurse Communication Mobility status        Time: 1829-9371 OT Time Calculation (min): 30 min  Charges: OT General Charges $OT Visit: 1 Visit OT Treatments $Self Care/Home Management : 8-22 mins   Brynn, OTR/L  Acute Rehabilitation Services Pager: [phone] Office: [phone] .   Jeri Modena 08/26/2021, 4:56 PM

## 2021-08-27 ENCOUNTER — Inpatient Hospital Stay (HOSPITAL_COMMUNITY): Payer: 59

## 2021-08-27 DIAGNOSIS — M4624 Osteomyelitis of vertebra, thoracic region: Secondary | ICD-10-CM | POA: Diagnosis not present

## 2021-08-27 LAB — GLUCOSE, CAPILLARY
Glucose-Capillary: 180 mg/dL — ABNORMAL HIGH (ref 70–99)
Glucose-Capillary: 211 mg/dL — ABNORMAL HIGH (ref 70–99)
Glucose-Capillary: 183 mg/dL — ABNORMAL HIGH (ref 70–99)
Glucose-Capillary: 39 mg/dL — CL (ref 70–99)
Glucose-Capillary: 47 mg/dL — ABNORMAL LOW (ref 70–99)
Glucose-Capillary: 59 mg/dL — ABNORMAL LOW (ref 70–99)
Glucose-Capillary: 95 mg/dL (ref 70–99)
Glucose-Capillary: 166 mg/dL — ABNORMAL HIGH (ref 70–99)
Glucose-Capillary: 286 mg/dL — ABNORMAL HIGH (ref 70–99)

## 2021-08-27 LAB — BASIC METABOLIC PANEL
Anion gap: 8 (ref 5–15)
BUN: 49 mg/dL — ABNORMAL HIGH (ref 6–20)
CO2: 20 mmol/L — ABNORMAL LOW (ref 22–32)
Calcium: 8.4 mg/dL — ABNORMAL LOW (ref 8.9–10.3)
Chloride: 116 mmol/L — ABNORMAL HIGH (ref 98–111)
Creatinine, Ser: 1.64 mg/dL — ABNORMAL HIGH (ref 0.61–1.24)
GFR, Estimated: 52 mL/min — ABNORMAL LOW (ref >60)
Glucose, Bld: 173 mg/dL — ABNORMAL HIGH (ref 70–99)
Potassium: 4.7 mmol/L (ref 3.5–5.1)
Sodium: 144 mmol/L (ref 135–145)

## 2021-08-27 LAB — CBC WITH DIFFERENTIAL/PLATELET
Abs Immature Granulocytes: 0.05 10*3/uL (ref 0.00–0.07)
Basophils Absolute: 0 10*3/uL (ref 0.0–0.1)
Basophils Relative: 0 %
Eosinophils Absolute: 0.7 10*3/uL — ABNORMAL HIGH (ref 0.0–0.5)
Eosinophils Relative: 6 %
HCT: 29 % — ABNORMAL LOW (ref 39.0–52.0)
Hemoglobin: 8.9 g/dL — ABNORMAL LOW (ref 13.0–17.0)
Immature Granulocytes: 1 %
Lymphocytes Relative: 11 %
Lymphs Abs: 1.1 10*3/uL (ref 0.7–4.0)
MCH: 28.7 pg (ref 26.0–34.0)
MCHC: 30.7 g/dL (ref 30.0–36.0)
MCV: 93.5 fL (ref 80.0–100.0)
Monocytes Absolute: 1.2 10*3/uL — ABNORMAL HIGH (ref 0.1–1.0)
Monocytes Relative: 11 %
Neutro Abs: 7.7 10*3/uL (ref 1.7–7.7)
Neutrophils Relative %: 71 %
Platelets: 342 10*3/uL (ref 150–400)
RBC: 3.1 MIL/uL — ABNORMAL LOW (ref 4.22–5.81)
RDW: 18.3 % — ABNORMAL HIGH (ref 11.5–15.5)
WBC: 10.9 10*3/uL — ABNORMAL HIGH (ref 4.0–10.5)
nRBC: 0 % (ref 0.0–0.2)

## 2021-08-27 LAB — MAGNESIUM: Magnesium: 2.1 mg/dL (ref 1.7–2.4)

## 2021-08-27 LAB — PHOSPHORUS: Phosphorus: 3.3 mg/dL (ref 2.5–4.6)

## 2021-08-27 IMAGING — CR DG CHEST 2V
2 series · 2 of 2 positions shown · non-contrast
Comparison: [DATE]

CLINICAL DATA: Short of breath

EXAM:
CHEST - 2 VIEW

[chest lat]
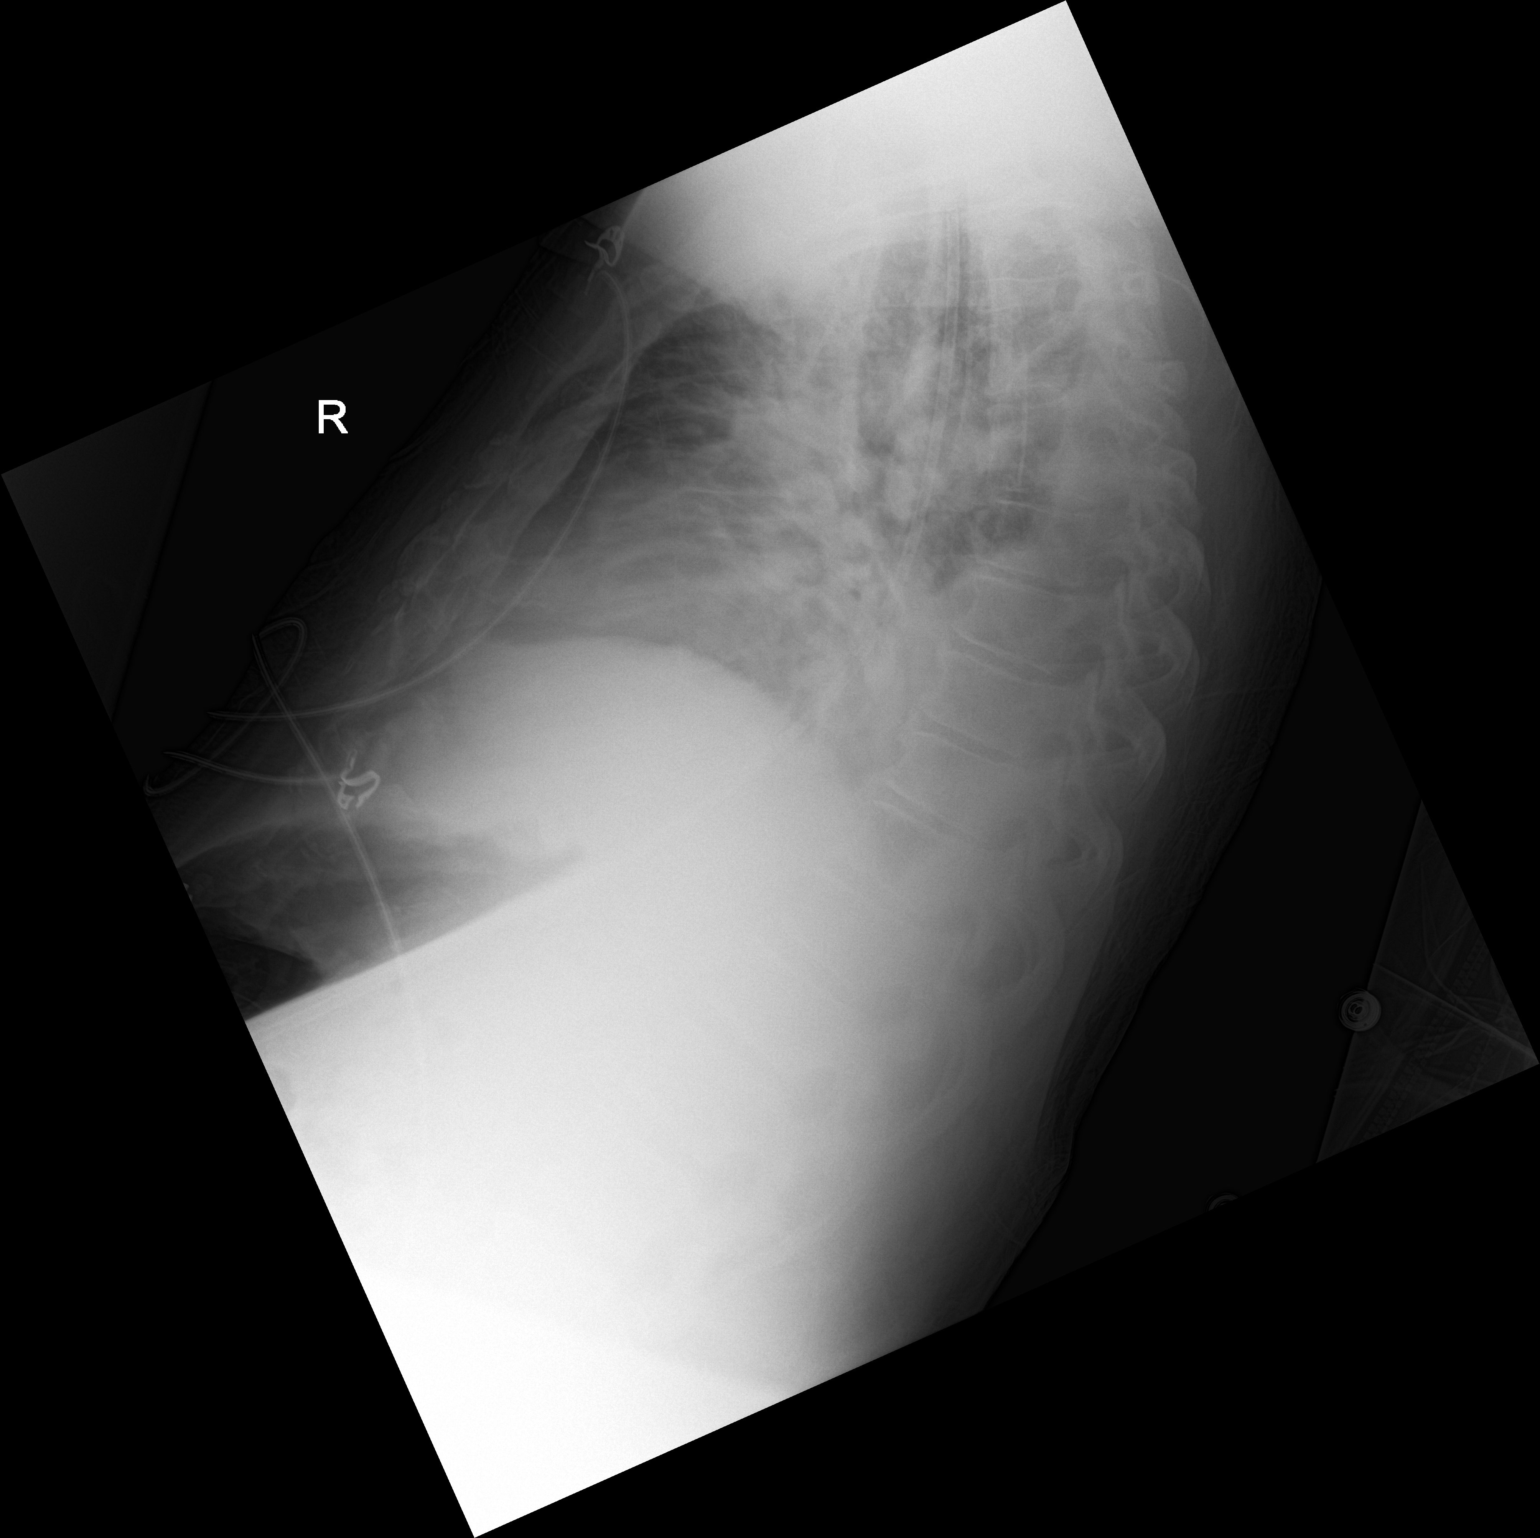

[chest ap]
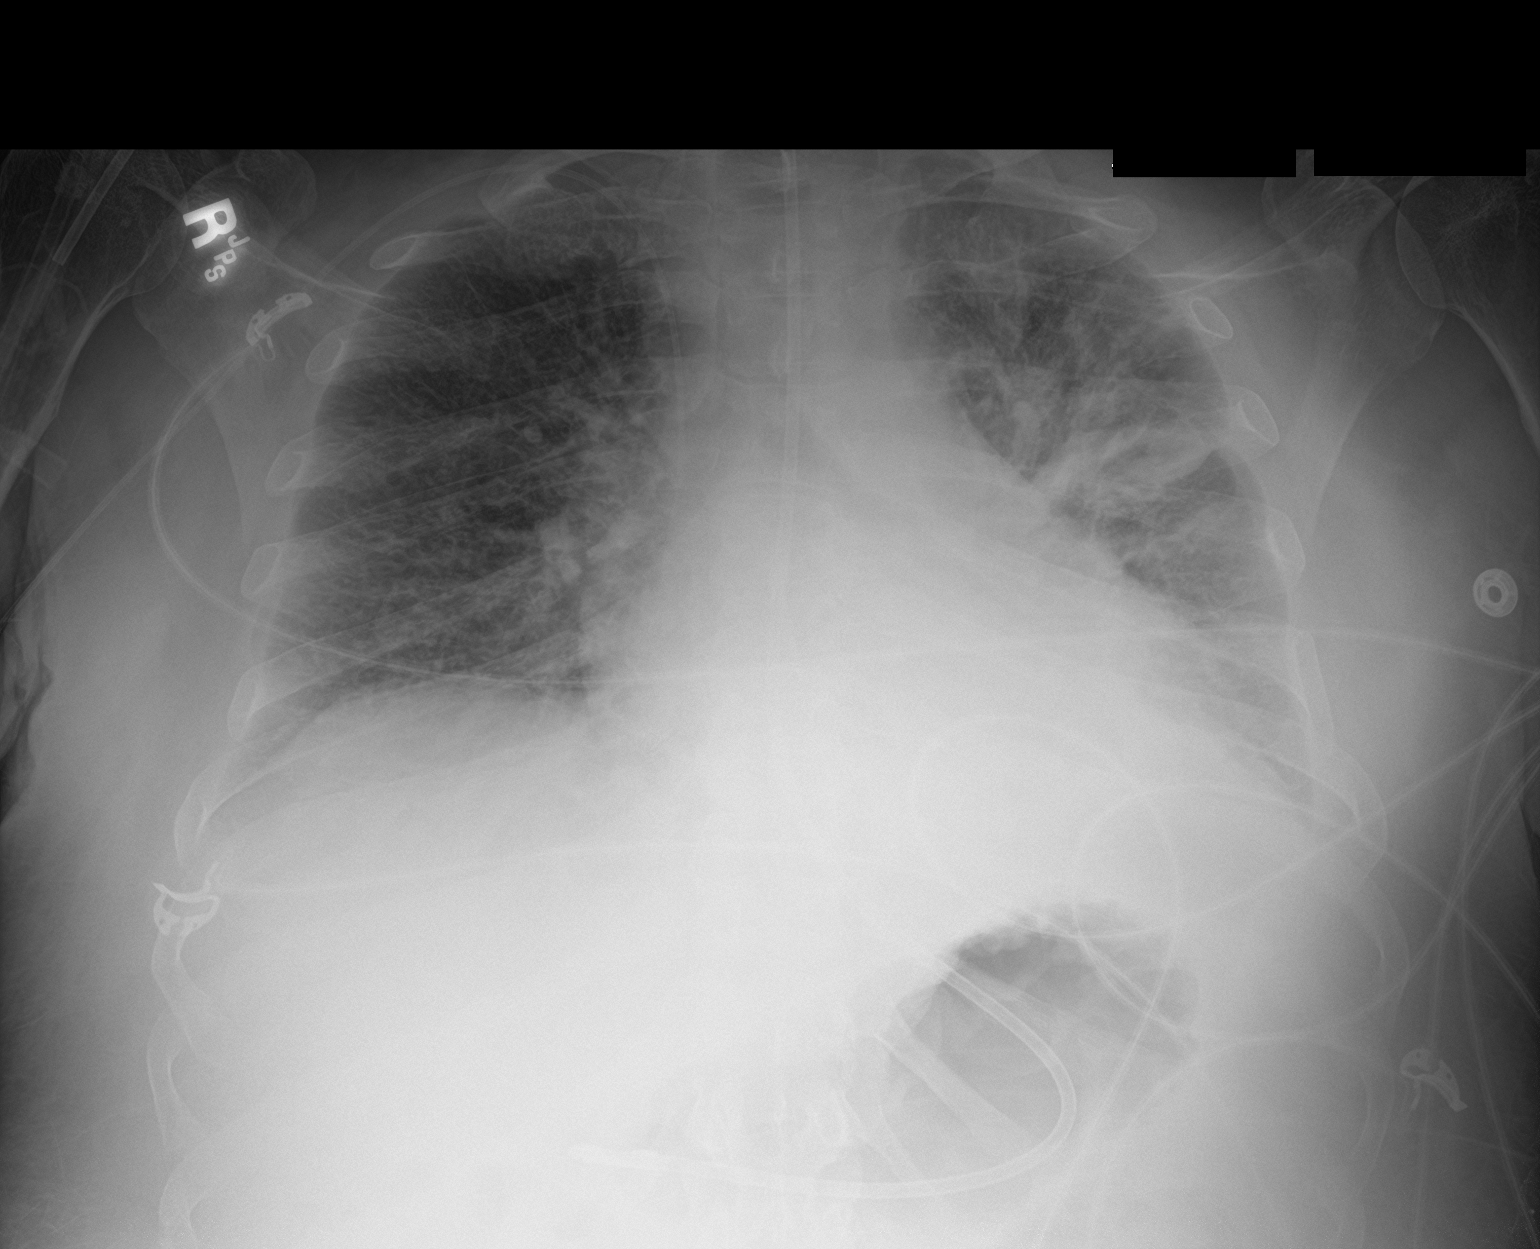

[2 of 2 positions shown; findings below may reference images not displayed]

FINDINGS: Right arm PICC tip in the SVC unchanged. Feeding tube in the gastric
antrum.

Hypoventilation with decreased lung volumes. Small left pleural
effusion. No pneumothorax

Mild right lower lobe atelectasis unchanged.
IMPRESSION: Hypoventilation. Progression of airspace disease on the left likely
due to atelectasis and/or infiltrate.

## 2021-08-27 MED ORDER — OXYCODONE-ACETAMINOPHEN 5-325 MG PO TABS
1.0000 | ORAL_TABLET | Freq: Four times a day (QID) | ORAL | Status: DC | PRN
  Administered 2021-08-27 – 2021-08-30 (×6): 1 via ORAL
  Filled 2021-08-27 (×6): qty 1

## 2021-08-27 MED ORDER — BOOST / RESOURCE BREEZE PO LIQD CUSTOM
1.0000 | Freq: Three times a day (TID) | ORAL | Status: DC
  Administered 2021-08-27 – 2021-08-30 (×7): 1 via ORAL

## 2021-08-27 MED ORDER — DEXTROSE 50 % IV SOLN
INTRAVENOUS | Status: AC
  Filled 2021-08-27: qty 50

## 2021-08-27 MED ORDER — INSULIN GLARGINE-YFGN 100 UNIT/ML ~~LOC~~ SOLN
10.0000 [IU] | Freq: Every day | SUBCUTANEOUS | Status: DC
  Administered 2021-08-27: 10 [IU] via SUBCUTANEOUS
  Filled 2021-08-27 (×2): qty 0.1

## 2021-08-27 MED ORDER — AMLODIPINE BESYLATE 10 MG PO TABS
10.0000 mg | ORAL_TABLET | Freq: Every day | ORAL | Status: DC
  Administered 2021-08-27 – 2021-08-30 (×4): 10 mg via ORAL
  Filled 2021-08-27 (×4): qty 1

## 2021-08-27 MED ORDER — VITAL 1.5 CAL PO LIQD
1000.0000 mL | ORAL | Status: DC
  Administered 2021-08-27 – 2021-08-28 (×2): 1000 mL
  Filled 2021-08-27 (×4): qty 1000

## 2021-08-27 MED ORDER — PROSOURCE TF PO LIQD
45.0000 mL | Freq: Three times a day (TID) | ORAL | Status: DC
  Administered 2021-08-27 – 2021-08-30 (×9): 45 mL
  Filled 2021-08-27 (×9): qty 45

## 2021-08-27 MED ORDER — DEXTROSE 5 % IV SOLN: INTRAVENOUS | Status: DC

## 2021-08-27 NOTE — Progress Notes (Signed)
Occupational Therapy Treatment Patient Details Name: Jose [name] MRN: [id number] DOB: 30-Jan-1976 Today's Date: 08/27/2021   History of present illness 45 y.o. male admitted on 08/06/2021 with worsening back pain, S/P T4-5 laminectomy, LT5 pediculotomy, and decompression of spinal cord on 10/14.  Pt with acute hypoxic respiratory failure in setting of recent GI bleed and recent spinal surgery. Endoscopy 10/17. Intubated 10/19-10/24. PMhx: T2DM, retinopathy, neuropathy, HTN, HLD, CKD, covid July 2022.   OT comments  Pt progressing towards acute OT goals. Focus of session was building tolerance for OOB activity. Pt able to stand 2x from EOB with min A +2 and use of rw. Pt also able to side step towards his left side. Cues needed for safety at times, can be restless/impulsive during movement with corresponding report of increased pain. Full session details below. D/c plan remains appropriate.    Recommendations for follow up therapy are one component of a multi-disciplinary discharge planning process, led by the attending physician.  Recommendations may be updated based on patient status, additional functional criteria and insurance authorization.    Follow Up Recommendations  Acute inpatient rehab (3hours/day)    Assistance Recommended at Discharge Frequent or constant Supervision/Assistance  Equipment Recommendations  Other (comment) (defert o next venue)    Recommendations for Other Services      Precautions / Restrictions Precautions Precautions: Fall;Back Precaution Comments: reviewed back precautions for bed mobility, cortrak Restrictions Weight Bearing Restrictions: No       Mobility Bed Mobility Overal bed mobility: Needs Assistance Bed Mobility: Rolling;Sidelying to Sit;Sit to Supine Rolling: Min assist Sidelying to sit: Mod assist   Sit to supine: Mod assist;+2 for physical assistance;+2 for safety/equipment   General bed mobility comments: assist to power over to  sidelying and for trunk elevation. Pt quickly returning to supine at end of session, OT assisting for safety and to control descent. Educated on logroll technique its benefit.    Transfers Overall transfer level: Needs assistance Equipment used: Rolling walker (2 wheels) Transfers: Sit to/from Stand Sit to Stand: Min assist;+2 physical assistance;+2 safety/equipment;From elevated surface           General transfer comment: EOB partially elevated. Cues for technique with rw. Assist to steady and powerup. Utilized rw and +2 physical assist.     Balance Overall balance assessment: Needs assistance Sitting-balance support: Bilateral upper extremity supported;Feet supported Sitting balance-Leahy Scale: Fair Sitting balance - Comments: uses UE's for stability and to decrease pain.   Standing balance support: Bilateral upper extremity supported;During functional activity Standing balance-Leahy Scale: Poor                             ADL either performed or assessed with clinical judgement   ADL Overall ADL's : Needs assistance/impaired                                       General ADL Comments: Pt completed bed mobility, stood 2x from EOB, rest break incorporated, min A +2. Able to side step to left on first stand. Cues second to decreased attention span and a bit impulsive at times.     Vision       Perception     Praxis      Cognition Arousal/Alertness: Awake/alert Behavior During Therapy: Restless Overall Cognitive Status: No family/caregiver present to determine baseline cognitive functioning Area of Impairment: Attention;Following  commands;Memory;Safety/judgement;Awareness;Problem solving;Orientation                   Current Attention Level: Sustained Memory: Decreased short-term memory;Decreased recall of precautions Following Commands: Follows one step commands inconsistently Safety/Judgement: Decreased awareness of  safety;Decreased awareness of deficits Awareness: Emergent Problem Solving: Slow processing General Comments: Can be impulsive during transfers. Internally distracted. Pleasant demeanor.          Exercises     Shoulder Instructions       General Comments      Pertinent Vitals/ Pain       Pain Assessment: Faces Faces Pain Scale: Hurts whole lot Pain Location: back Pain Descriptors / Indicators: Grimacing;Restless Pain Intervention(s): Limited activity within patient's tolerance;Premedicated before session;Monitored during session;Repositioned  Home Living                                          Prior Functioning/Environment              Frequency  Min 2X/week        Progress Toward Goals  OT Goals(current goals can now be found in the care plan section)  Progress towards OT goals: Progressing toward goals  Acute Rehab OT Goals OT Goal Formulation: With patient Time For Goal Achievement: 09/09/21 Potential to Achieve Goals: Fair ADL Goals Pt Will Perform Grooming: with supervision;sitting;bed level Pt Will Perform Lower Body Dressing: with modified independence;sit to/from stand Pt Will Transfer to Toilet: with mod assist;bedside commode Pt Will Perform Toileting - Clothing Manipulation and hygiene: with modified independence;sit to/from stand Additional ADL Goal #1: Pt will complete bed mobility with min asisst +2 as precursor to ADLs.  Plan Frequency remains appropriate;Discharge plan remains appropriate    Co-evaluation                 AM-PAC OT "6 Clicks" Daily Activity     Outcome Measure   Help from another person eating meals?: A Little Help from another person taking care of personal grooming?: A Lot Help from another person toileting, which includes using toliet, bedpan, or urinal?: A Lot Help from another person bathing (including washing, rinsing, drying)?: A Lot Help from another person to put on and taking off  regular upper body clothing?: A Lot Help from another person to put on and taking off regular lower body clothing?: A Lot 6 Click Score: 13    End of Session Equipment Utilized During Treatment: Oxygen;Rolling walker (2 wheels)  OT Visit Diagnosis: Other abnormalities of gait and mobility (R26.89);Muscle weakness (generalized) (M62.81);Pain   Activity Tolerance Patient tolerated treatment well;Patient limited by fatigue;Patient limited by pain   Patient Left in bed;with call bell/phone within reach;with nursing/sitter in room   Nurse Communication          Time: 4081-4481 OT Time Calculation (min): 23 min  Charges: OT General Charges $OT Visit: 1 Visit OT Treatments $Self Care/Home Management : 23-37 mins  Tyrone Schimke, OT Acute Rehabilitation Services Pager: [phone] Office: [phone]   Hortencia Pilar 08/27/2021, 1:21 PM

## 2021-08-27 NOTE — Progress Notes (Signed)
Hypoglycemic Event  CBG: 39 at 1545  Treatment: 8 oz juice/soda  Symptoms:  Drowsy, but otherwise no symptoms.  Follow-up CBG: Time: 1608 CBG Result: 47  Follow-up CBG: Time: 1651 CBG Result: 59 - Another 8 ounce boost and a peanut butter cup given, then when orders received, D5 started at 73mL/hr  Follow-up CBG: Time: 1725 CBG Result: 95   Possible Reasons for Event: Inadequate meal intake and Other: continuous tube feeding during the day was discontinued  Comments/MD notified: Dr. Pietro Cassis notified, insulin orders changed, D5 continuous infusion ordered.    Carolynn Serve

## 2021-08-27 NOTE — Plan of Care (Signed)

## 2021-08-27 NOTE — Progress Notes (Signed)
Inpatient Rehabilitation Admissions Coordinator   I met at bedside with patient and a friend. I discussed goals and expectations a possible [facility] admit. He would like me to contact his wife, Junie Panning, to further discuss. I have left her a voicemail and await her call back to proceed. I will begin Auth with [facility] for possible admit.  Danne Baxter, RN, MSN Rehab Admissions Coordinator [phone] 08/27/2021 3:16 PM

## 2021-08-27 NOTE — Progress Notes (Signed)
[place]  DOB: [birth year]  PCP: [name], NP ZCH:[id number]  DOA: 08/06/2021  LOS: 21 days  Hospital Day: 43  Chief complaint: Worsening back pain  Brief narrative: Jose [name] is a 45 y.o. male with PMH significant for DM2 with complications including retinopathy, neuropathy, microalbuminuria; HTN, HLD, CKD 3 (baseline creatinine 1.1-1.2) who was last hospitalized from 9/28-10/10 for MSSA bacteremia/T4-T5 osteomyelitis and discharged home on IV antibiotics.  However he continued to have worsening back pain and was barely able to get up out of his bed and hence brought back to ED on 10/14.   MRI thoracic spine confirmed the finding and also showed a possible paraspinal phlegmon, interval T5 pathological fracture and new 4 mm of bony retropulsion.  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  There are also multiple small subcentimeter fluid collection in the left posterior paraspinal area suggestive of tiny abscesses.  Admitted to hospitalist service. Neurosurgery consultation was obtained. On 10/14, patient underwent a 45 laminectomy, left T5 pedicle ectomy and spinal cord decompression. His hospital course was also complicated by acute anemia requiring EGD, acute hypoxic respiratory failure requiring intubation, successfully extubated  10/17 - Hgb drop to 6.2, coffee ground emesis s/p EGD showed circumferential esophagitis, nonbleeding duodenal ulcers and old blood in the stomach 10/17 - hypoxia and pccm consult  10/19-10/24 -mechanical ventilation with successful extubation 10/25 - Transferred to [facility]  See below for details.   Subjective: Patient was seen and examined this morning.  Pleasant middle-aged Caucasian male.  Looks older for his age. Wife at bedside. X-ray this morning showed hypoventilation and progression of airspace disease in the left likely due to atelectasis  or infiltrate.  Assessment/Plan: Acute T4-T5 osteomyelitis MSSA bacteremia -Imaging and procedures as mentioned above. -Intra-Op culture grew MSSA -Per ID recommendation, patient is currently on IV Ancef.  Acute hypoxic respiratory failure Aspiration pneumonia -On 10/17, patient was noted to be hypoxic which worsened in he required intubation on 10/19, subsequently extubated on 10/24.  This was suspected to be due to aspiration pneumonia.  He completed a course of Unasyn. -Repeat chest x-ray on 10/30 showed persistent diffuse but asymmetric interstitial and airspace process. -Clinically patient has no fever, WBC count improved to normal but he continues to have crackles on left lung base. -Recent echo from 10/5 showed EF of 60 to 65%. -PE was also considered.  VQ scan showed intermediate probability.  DVT scan of lower extremities were negative.  Patient could not be anticoagulated empirically because of active GI bleeding. -Repeat chest x-ray this morning 11/3 showed hypoventilation and progression of airspace disease in the left likely due to atelectasis or infiltrate. -Aspiration precautions. Recent Labs  Lab 08/23/21 1415 08/24/21 0435 08/25/21 0525 08/26/21 0345 08/27/21 0320  WBC 22.5* 16.6* 10.8* 9.7 10.9*   Dysphagia Malnutrition -Probably related to esophagitis and generalized deconditioning.  Albumin level significantly low. -speech therapy following.  Currently has a core track in place.   -Discussed with dietitian this morning.  Regular diet allowed.  However patient has poor oral intake.  We will continue nightly core track feeding tube improve nutrition.  Once oral intake improves, will pull out the coretrak tube. Recent Labs  Lab 08/22/21 0530 08/23/21 0450 08/24/21 0435 08/25/21 0525 08/26/21 0345  ALBUMIN 1.8* 1.8* 1.8* 1.7* 1.8*   Acute blood loss anemia Acute GI bleeding Candida esophagitis -EGD 10/17 showed showed circumferential esophagitis,  nonbleeding  duodenal ulcers and old blood in the stomach.  Esophageal biopsy confirmed Candida esophagitis.  Patient completed a course of Diflucan today 11/2. -Patient continued to have intermittent GI bleeding with drop in hemoglobin and hence underwent repeat EGD on 10/31.  It showed healing esophagitis did not show any active signs of bleeding. -Currently on Protonix and tube feedings. -Received a total of 11 units of PRBC this hospitalization.  For the last 4 days, hemoglobin is stable between 8 and 9. -Continue to monitor Recent Labs    05/18/21 1703 05/19/21 0431 05/20/21 0359 07/22/21 1051 07/23/21 0508 07/24/21 0550 08/23/21 1415 08/24/21 0435 08/25/21 0525 08/26/21 0345 08/27/21 0320  HGB 11.2*   < > 9.0*   < > 8.6*   < > 8.5* 8.5* 8.5* 8.7* 8.9*  MCV 84.9   < > 87.3   < > 81.9   < > 90.6 92.3 92.8 92.5 93.5  VITAMINB12  --   --  1,734*  --  252  --   --   --   --   --   --   FOLATE  --   --  43.8  --  13.3  --   --   --   --   --   --   FERRITIN 167  --  126  --  193  --   --   --   --   --   --   TIBC  --   --  250  --  223*  --   --   --   --   --   --   IRON  --   --  59  --  21*  --   --   --   --   --   --   RETICCTPCT  --   --   --   --  1.1  --   --   --   --   --   --    < > = values in this interval not displayed.   AKI on CKD 3a -Baseline creatinine 1.1-1.2. -Creatinine worsened to peak at 4.53, probably because of ATN. Creatinine is gradually improving, 1.64 today.  Nephrology signed off. Recent Labs    08/19/21 1520 08/19/21 2155 08/20/21 0327 08/21/21 0515 08/22/21 0530 08/23/21 0450 08/24/21 0435 08/25/21 0525 08/26/21 0345 08/27/21 0320  BUN 123* 119* 114* 96* 76* 81* 70* 60* 52* 49*  CREATININE 3.27* 3.00* 2.94* 2.26* 1.99* 2.21* 2.18* 2.02* 1.83* 1.64*   Hypernatremia -Sodium level was at a peak of 156 on 10/28.  Resolved.  Currently in normal range. Recent Labs  Lab 08/21/21 0515 08/21/21 0625 08/22/21 0530 08/23/21 0450 08/24/21 0435  08/25/21 0525 08/26/21 0345 08/27/21 0320  NA 152* 156* 139 149* 144 143 142 144   Type 2 diabetes mellitus Hypoglycemic episodes -A1c 7.5 on 8/18.  Patient had hypoglycemic episodes while tube feed was on hold. -Currently on insulin aspart 8 units every 4 hours, sliding scale insulin as well as glimepiride 1 mg daily. -I will start him on Lantus 15 units from tonight.  Continue sliding scale insulin. Recent Labs  Lab 08/26/21 1941 08/26/21 2307 08/27/21 0340 08/27/21 0802 08/27/21 1130  GLUCAP 125* 105* 180* 211* 183*   Essential hypertension -Blood pressure running mostly above 140s in last 24 hours. -Prior to admission, patient was on verapamil.  Currently on Lopressor 50 mg twice daily and amlodipine 5 mg daily.  Blood pressure running elevated.  Increased amlodipine dose to 10 mg this morning.    Sacral decubitus ulcer stage II - POA -wound care on board.  Constipation -10/13, CT abdomen showed large burden of stool and stool balls throughout the colon  Acute urinary retention -CT abdomen on admission showed distended urinary bladder with urinary retention, probably related to constipation.  Foley catheter was inserted.  Remove Foley catheter, voiding trial today.  Mobility: PT following.  Moderate assistance required for bed mobility.  CIR pending Living condition: Was living at home with his wife Goals of care:   Code Status: Full Code  Nutritional status: Body mass index is 28.59 kg/m. Nutrition Problem: Increased nutrient needs Etiology: post-op healing Signs/Symptoms: estimated needs Diet:  Diet Order             DIET DYS 3 Room service appropriate? Yes with Assist; Fluid consistency: Thin  Diet effective now                  DVT prophylaxis:  SCDs Start: 08/06/21 1536   Antimicrobials: IV Ancef Fluid: None Consultants: Neurosurgery, critical care, ID, nephrology signed off Family Communication: Wife at bedside  Status is: Inpatient  Remains  inpatient appropriate because: As core track, Foley catheter, oxygen requirement, severe deconditioning  Dispo: The patient is from: Home              Anticipated d/c is to: CIR              Patient currently is not medically stable to d/c.   Difficult to place patient No     Infusions:   sodium chloride 10 mL/hr at 08/26/21 1010    ceFAZolin (ANCEF) IV 2 g (08/27/21 0516)   promethazine (PHENERGAN) injection (IM or IVPB) 25 mg (08/25/21 1649)    Scheduled Meds:  amLODipine  10 mg Oral Daily   Chlorhexidine Gluconate Cloth  6 each Topical Daily   collagenase   Topical Daily   darbepoetin (ARANESP) injection - NON-DIALYSIS  150 mcg Subcutaneous Q Mon-1800   feeding supplement  1 Container Oral TID BM   feeding supplement (PROSource TF)  45 mL Per Tube TID   feeding supplement (VITAL 1.5 CAL)  1,000 mL Per Tube Q24H   gabapentin  200 mg Oral BID   glimepiride  1 mg Oral Q breakfast   insulin aspart  0-20 Units Subcutaneous Q4H   insulin aspart  8 Units Subcutaneous Q4H   insulin glargine-yfgn  10 Units Subcutaneous QHS   living well with diabetes book   Does not apply Once   mouth rinse  15 mL Mouth Rinse BID   metoprolol tartrate  50 mg Oral BID   mupirocin ointment  1 application Topical BID   pantoprazole  40 mg Oral BID   sodium chloride flush  10-40 mL Intracatheter Q12H   sucralfate  1 g Oral TID WC & HS   traZODone  100 mg Oral QHS   venlafaxine  37.5 mg Oral BID WC    PRN meds: sodium chloride, acetaminophen **OR** acetaminophen, albuterol, bisacodyl, hydrALAZINE, hydrOXYzine, lip balm, LORazepam, ondansetron (ZOFRAN) IV, polyethylene glycol, promethazine (PHENERGAN) injection (IM or IVPB), senna-docusate, sodium chloride flush, sodium phosphate   Antimicrobials: Anti-infectives (From admission, onward)    Start     Dose/Rate Route Frequency Ordered Stop   08/21/21 1300  ceFAZolin (ANCEF) IVPB 2g/100 mL premix        2 g 200 mL/hr over 30 Minutes Intravenous  Every 8 hours 08/21/21 0918  08/21/21 1000  fluconazole (DIFLUCAN) tablet 400 mg  Status:  Discontinued        400 mg Per Tube Daily 08/20/21 1104 08/21/21 0925   08/21/21 1000  fluconazole (DIFLUCAN) IVPB 400 mg        400 mg 100 mL/hr over 120 Minutes Intravenous Every 24 hours 08/21/21 0925 08/26/21 1059   08/20/21 1200  fluconazole (DIFLUCAN) tablet 200 mg        200 mg Per Tube  Once 08/20/21 1104 08/20/21 1207   08/20/21 1000  fluconazole (DIFLUCAN) tablet 200 mg  Status:  Discontinued        200 mg Per Tube Daily 08/20/21 0825 08/20/21 1104   08/20/21 0047  Ampicillin-Sulbactam (UNASYN) 3 g in sodium chloride 0.9 % 100 mL IVPB  Status:  Discontinued        3 g 200 mL/hr over 30 Minutes Intravenous Every 12 hours 08/19/21 1619 08/19/21 1621   08/19/21 2100  Ampicillin-Sulbactam (UNASYN) 3 g in sodium chloride 0.9 % 100 mL IVPB  Status:  Discontinued        3 g 200 mL/hr over 30 Minutes Intravenous Every 8 hours 08/19/21 1621 08/21/21 0918   08/19/21 1200  Ampicillin-Sulbactam (UNASYN) 3 g in sodium chloride 0.9 % 100 mL IVPB  Status:  Discontinued        3 g 200 mL/hr over 30 Minutes Intravenous Every 8 hours 08/19/21 1055 08/19/21 1619   08/19/21 1015  fluconazole (DIFLUCAN) IVPB 200 mg  Status:  Discontinued        200 mg 100 mL/hr over 60 Minutes Intravenous Every 24 hours 08/19/21 0916 08/20/21 0825   08/17/21 2300  ceFAZolin (ANCEF) IVPB 2g/100 mL premix  Status:  Discontinued        2 g 200 mL/hr over 30 Minutes Intravenous Every 12 hours 08/17/21 1054 08/19/21 1055   08/16/21 1000  fluconazole (DIFLUCAN) tablet 200 mg  Status:  Discontinued        200 mg Per Tube Daily 08/15/21 1414 08/19/21 0916   08/15/21 2314  Ampicillin-Sulbactam (UNASYN) 3 g in sodium chloride 0.9 % 100 mL IVPB  Status:  Discontinued        3 g 200 mL/hr over 30 Minutes Intravenous Every 12 hours 08/15/21 1410 08/17/21 1054   08/13/21 1000  fluconazole (DIFLUCAN) tablet 400 mg  Status:  Discontinued         400 mg Per Tube Daily 08/12/21 1148 08/15/21 1414   08/12/21 1000  fluconazole (DIFLUCAN) tablet 400 mg  Status:  Discontinued        400 mg Oral Daily 08/12/21 0913 08/12/21 1148   08/11/21 1800  vancomycin (VANCOREADY) IVPB 1250 mg/250 mL  Status:  Discontinued        1,250 mg 166.7 mL/hr over 90 Minutes Intravenous Every 24 hours 08/10/21 1651 08/11/21 0939   08/11/21 1030  ceFEPIme (MAXIPIME) 2 g in sodium chloride 0.9 % 100 mL IVPB  Status:  Discontinued        2 g 200 mL/hr over 30 Minutes Intravenous Every 12 hours 08/11/21 0936 08/11/21 0939   08/11/21 1030  Ampicillin-Sulbactam (UNASYN) 3 g in sodium chloride 0.9 % 100 mL IVPB  Status:  Discontinued        3 g 200 mL/hr over 30 Minutes Intravenous Every 6 hours 08/11/21 0940 08/15/21 1410   08/10/21 1730  vancomycin (VANCOREADY) IVPB 1750 mg/350 mL        1,750 mg 175 mL/hr over 120 Minutes  Intravenous  Once 08/10/21 1651 08/10/21 1956   08/10/21 1715  piperacillin-tazobactam (ZOSYN) IVPB 3.375 g  Status:  Discontinued        3.375 g 12.5 mL/hr over 240 Minutes Intravenous Every 8 hours 08/10/21 1651 08/11/21 0936   08/06/21 2200  ceFAZolin (ANCEF) IVPB 2g/100 mL premix  Status:  Discontinued        2 g 200 mL/hr over 30 Minutes Intravenous Every 8 hours 08/06/21 1829 08/10/21 1631   08/06/21 1415  vancomycin (VANCOREADY) IVPB 1750 mg/350 mL  Status:  Discontinued        1,750 mg 175 mL/hr over 120 Minutes Intravenous Every 24 hours 08/06/21 1400 08/06/21 1829   08/06/21 1415  ceFEPIme (MAXIPIME) 2 g in sodium chloride 0.9 % 100 mL IVPB  Status:  Discontinued        2 g 200 mL/hr over 30 Minutes Intravenous Every 8 hours 08/06/21 1400 08/06/21 1829       Objective: Vitals:   08/27/21 0806 08/27/21 1100  BP: (!) 168/87 123/84  Pulse: (!) 101 85  Resp: 20 20  Temp: 97.7 F (36.5 C) 98 F (36.7 C)  SpO2: 91% 91%    Intake/Output Summary (Last 24 hours) at 08/27/2021 1325 Last data filed at 08/27/2021  1136 Gross per 24 hour  Intake 2051 ml  Output 2250 ml  Net -199 ml   Filed Weights   08/25/21 0334 08/26/21 0419 08/27/21 0431  Weight: 93.2 kg 91.6 kg 93 kg   Weight change: 1.361 kg Body mass index is 28.59 kg/m.   Physical Exam: General exam: Pleasant middle-aged Caucasian male.  Looks older for his age. Skin: No rashes, lesions or ulcers. HEENT: Atraumatic, normocephalic, no obvious bleeding Lungs: Has coarse crackles on left lower base.  Otherwise clear to auscultation CVS: Regular rate and rhythm, no murmur GI/Abd soft, nontender, nondistended, bowels are present CNS: Alert, awake, oriented x3 Psychiatry: Mood appropriate Extremities: No pedal edema, no calf tenderness  Data Review: I have personally reviewed the laboratory data and studies available.  F/u labs ordered Unresulted Labs (From admission, onward)     Start     Ordered   08/27/21 0500  CBC with Differential/Platelet  Daily,   R     Question:  Specimen collection method  Answer:  Unit=Unit collect   08/26/21 1415   08/27/21 5027  Basic metabolic panel  Daily,   R     Question:  Specimen collection method  Answer:  Unit=Unit collect   08/26/21 1415            Signed, Terrilee Croak, MD [facility] 08/27/2021

## 2021-08-27 NOTE — Progress Notes (Signed)
Nutrition Follow-up  DOCUMENTATION CODES:   Not applicable  INTERVENTION:   - Boost Breeze po TID, each supplement provides 250 kcal and 9 grams of protein  - d/c Ensure Enlive  Transition to nocturnal tube feeding via Cortrak: - Vital 1.5 @ 75 ml/hr x 12 hours from 2000 to 0800 (total of 900 ml) - ProSource TF 45 ml TID  Nocturnal tube feeding regimen provides 1470 kcal, 94 grams of protein, and 688 ml of H2O (meets 68% of kcal needs and 85% of protein needs).  NUTRITION DIAGNOSIS:   Increased nutrient needs related to post-op healing as evidenced by estimated needs.  Ongoing, being addressed via TF and oral nutrition supplements  GOAL:   Patient will meet greater than or equal to 90% of their needs  Progressing, being addressed via TF  MONITOR:   PO intake, Supplement acceptance, Diet advancement, Labs, Weight trends, TF tolerance, Skin  REASON FOR ASSESSMENT:   Consult Enteral/tube feeding initiation and management  ASSESSMENT:   Pt with PMH significant for type 2 DM w/ diabetic retinopathy, neuropathy, HTN, CKD stg IIIb, HLD, GAD, anemia, and recent hospitalization for MSSA bacteremia and osteomyelitis of thoracic spine at T5-T6. Pt was discharged home with abx but reports significant pain and inability to walk or sit-up since being discharged.  10/14 - s/p T4-T5 laminectomy, left T5 pediculotomy for decompression of spinal cord, intraoperative ultrasound, intraoperative microscope 10/16 - dark brown/red emesis 10/17 - s/p EGD showing esophagitis, hypoxia and PCCM consult, transferred to ICU 10/19 - intubated, Cortrak placed (tip gastric) 10/21 - s/p thoracentesis 10/24 - extubated 10/26 - diet advanced to dysphagia 1 with thin liquids 10/28 - TF held due to GI bleed 11/29 - TF resumed, held at midnight 10/31 - s/p EGD showing improvement of esophagitis, no evidence of ulcers, no evidence of active bleed, TF resumed 11/01 - diet advanced to dysphagia 3 with  thin liquids  Discussed pt with RN and MD. Will transition to nocturnal tube feeds to stimulate appetite and promote PO intake during the day.  Spoke with pt at bedside and explained plan. Pt in agreement. Pt states that he is not drinking the Ensure because they upset his stomach. Pt would like to switch back to Boost Breeze supplements. Pt reports that he is eating "okay" due to nausea. Nausea now improved. Pt states that his wife is going to bring him home-cooked food for lunch which he is looking forward to.  Admit weight: 86.2 kg Current weight: 93 kg  Current TF: Vital 1.5 @ 60 ml/hr, ProSource TF 45 ml BID  Meal Completion: 0-20%  Medications reviewed and include: aranesp weekly, Ensure Enlive TID, amaryl, SSI q 4 hours, protonix, sucralfate, IV abx  Labs reviewed: BUN 49, creatinine 1.64, hemoglobin 8.9 CBG's: 105-211 x 24 hours  UOP: 2350 ml x 24 hours I/O's: +10.7 L since admit  Diet Order:   Diet Order             DIET DYS 3 Room service appropriate? Yes with Assist; Fluid consistency: Thin  Diet effective now                   EDUCATION NEEDS:   No education needs have been identified at this time  Skin:  Skin Assessment: Skin Integrity Issues: DTI: bilateral buttocks Stage II: coccyx Diabetic Ulcer: L big toe Incisions: back  Last BM:  08/26/21 small type 6  Height:   Ht Readings from Last 1 Encounters:  08/24/21 5\' 11"  (  1.803 m)    Weight:   Wt Readings from Last 1 Encounters:  08/27/21 93 kg    BMI:  Body mass index is 28.59 kg/m.  Estimated Nutritional Needs:   Kcal:  2150-2350  Protein:  110-130 grams  Fluid:  >2L/d    Gustavus Bryant, MS, RD, LDN Inpatient Clinical Dietitian Please see AMiON for contact information.

## 2021-08-28 DIAGNOSIS — M4624 Osteomyelitis of vertebra, thoracic region: Secondary | ICD-10-CM | POA: Diagnosis not present

## 2021-08-28 LAB — GLUCOSE, CAPILLARY
Glucose-Capillary: 337 mg/dL — ABNORMAL HIGH (ref 70–99)
Glucose-Capillary: 247 mg/dL — ABNORMAL HIGH (ref 70–99)
Glucose-Capillary: 255 mg/dL — ABNORMAL HIGH (ref 70–99)
Glucose-Capillary: 123 mg/dL — ABNORMAL HIGH (ref 70–99)
Glucose-Capillary: 64 mg/dL — ABNORMAL LOW (ref 70–99)
Glucose-Capillary: 71 mg/dL (ref 70–99)
Glucose-Capillary: 176 mg/dL — ABNORMAL HIGH (ref 70–99)

## 2021-08-28 LAB — BASIC METABOLIC PANEL
Anion gap: 5 (ref 5–15)
BUN: 47 mg/dL — ABNORMAL HIGH (ref 6–20)
CO2: 22 mmol/L (ref 22–32)
Calcium: 8.4 mg/dL — ABNORMAL LOW (ref 8.9–10.3)
Chloride: 114 mmol/L — ABNORMAL HIGH (ref 98–111)
Creatinine, Ser: 1.52 mg/dL — ABNORMAL HIGH (ref 0.61–1.24)
GFR, Estimated: 57 mL/min — ABNORMAL LOW (ref >60)
Glucose, Bld: 327 mg/dL — ABNORMAL HIGH (ref 70–99)
Potassium: 4.3 mmol/L (ref 3.5–5.1)
Sodium: 141 mmol/L (ref 135–145)

## 2021-08-28 LAB — CBC WITH DIFFERENTIAL/PLATELET
Abs Immature Granulocytes: 0.07 10*3/uL (ref 0.00–0.07)
Basophils Absolute: 0.1 10*3/uL (ref 0.0–0.1)
Basophils Relative: 1 %
Eosinophils Absolute: 0.5 10*3/uL (ref 0.0–0.5)
Eosinophils Relative: 6 %
HCT: 29.4 % — ABNORMAL LOW (ref 39.0–52.0)
Hemoglobin: 8.8 g/dL — ABNORMAL LOW (ref 13.0–17.0)
Immature Granulocytes: 1 %
Lymphocytes Relative: 14 %
Lymphs Abs: 1.2 10*3/uL (ref 0.7–4.0)
MCH: 28.4 pg (ref 26.0–34.0)
MCHC: 29.9 g/dL — ABNORMAL LOW (ref 30.0–36.0)
MCV: 94.8 fL (ref 80.0–100.0)
Monocytes Absolute: 1 10*3/uL (ref 0.1–1.0)
Monocytes Relative: 12 %
Neutro Abs: 5.6 10*3/uL (ref 1.7–7.7)
Neutrophils Relative %: 66 %
Platelets: 323 10*3/uL (ref 150–400)
RBC: 3.1 MIL/uL — ABNORMAL LOW (ref 4.22–5.81)
RDW: 18.6 % — ABNORMAL HIGH (ref 11.5–15.5)
WBC: 8.4 10*3/uL (ref 4.0–10.5)
nRBC: 0 % (ref 0.0–0.2)

## 2021-08-28 MED ORDER — INSULIN GLARGINE-YFGN 100 UNIT/ML ~~LOC~~ SOLN
12.0000 [IU] | Freq: Every day | SUBCUTANEOUS | Status: DC
  Administered 2021-08-28 – 2021-08-29 (×2): 12 [IU] via SUBCUTANEOUS
  Filled 2021-08-28 (×3): qty 0.12

## 2021-08-28 MED ORDER — TRAZODONE HCL 50 MG PO TABS: 50.0000 mg | ORAL_TABLET | Freq: Every evening | ORAL | Status: DC | PRN

## 2021-08-28 MED ORDER — HYDROXYZINE HCL 10 MG PO TABS
10.0000 mg | ORAL_TABLET | Freq: Three times a day (TID) | ORAL | Status: DC | PRN
  Filled 2021-08-28: qty 1

## 2021-08-28 NOTE — PMR Pre-admission (Signed)
PMR Admission Coordinator Pre-Admission Assessment  Patient: Jose [name] is an 45 y.o., male MRN: [id number] DOB: [birth year] Height: _0  (180.3 cm) Weight: 97.1 kg  Insurance Information HMO:     PPO:      PCP:      IPA:      80/20:      OTHER:  PRIMARY: [place]      Policy#: [id number]      Subscriber: pt CM Name: Blanch Media      Phone#: [phone]     Fax#: [phone] Pre-Cert#: [id number] approved for 7 days with f/u [facility] phone [phone] ext [phone] fax [phone]      Employer:  Benefits:  Phone #: [phone]     Name: 11/3 Eff. Date: 10/25/2020     Deduct: none      Out of Pocket Max: $4200      Life Max: none CIR: 80%      SNF: 80% 60 days Outpatient: 80%     Co-Pay: 60 visits combined Home Health: 80%      Co-Pay: 100 visits per year DME: 80%     Co-Pay: 20% Providers: in network  SECONDARY: none    Financial Counselor:       Phone#:   [employer]" for patients in Inpatient Rehabilitation Facilities with attached "Privacy Act [place] Records" was provided and verbally reviewed with: N/A  Emergency Contact Information Contact Information     Name Relation Home Work Mobile   Naturita Spouse   [phone]       Current Medical History  Patient Admitting Diagnosis: Debility  History of Present Illness: 45 year old male with PMH significant for DM 2 with complications including retinopathy, neuropathy, microalbuminuria, HTN, HLD, CKD 3 who was last hospitalized for MSSA bacteremia/T 4-5 osteo and discharged home on IV antibiotics. He continued to have worsening back pain and was immobile and presented to ER on 08/06/21.   MRI confirmed the finding and showed a possible paraspinal phlegmon , interval T 5 pathological fx and new 4 mm bony retropulsion. 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 There were also multiple  small sub centimeter fluid collections in the left posterior paraspinal area suggestive of tiny abscesses.   On 10/14 he underwent a 4 to 5 laminectomy, left T 5 pedicle ectomy and spinal cord decompression. Postoperative course complicated by acute anemia requiring EGD, and acute respiratory failure requiring intubation with eventual extubation. Thought due to aspiration PNA. He completed a course of Unasyn.  Dysphagia probably related to esophagitis and generalized deconditioning. SLP consulted. Regular diet with poor intake . Currently on nightly feedings with Cortrak.   EGD 10/17 showed circumferential esophagitis, non bleeding duodenal ulcers and old blood in the stomach.  Esophageal biopsy confirmed Candida esophagitis.  Patient completed a course of Diflucan on 11/2.-Patient continued to have intermittent GI bleeding with drop in hemoglobin and hence underwent repeat EGD on 10/31.  It showed healing esophagitis did not show any active signs of bleeding.-Currently on Protonix and tube feedings.-Received a total of 11 units of PRBC this hospitalization.  For the last 5 days, hemoglobin is stable between 8 and 9.-Continue to monitor  Per ID recommendation, patient on IV Ancef. AKI on CKD 3 a with creatinine peaked at 4.53 felt due to ATN. Nephrology signed off. Type 2 DM with medication adjusted due to hypoglycemia and tube feeds changes. HTN  meds currently on Lopressor and amlodipine. Was on verapamil pta.     Patient's medical record from [facility] has been reviewed by the rehabilitation admission coordinator and physician.  Past Medical History  Past Medical History:  Diagnosis Date   GAD (generalized anxiety disorder)    Hyperlipidemia    Macular degeneration, bilateral    Retinopathy    Type II diabetes mellitus with complication, uncontrolled    retinopathy, neuropathy, microalbuminuria    Has the patient had major surgery during 100 days prior to admission? Yes  Family  History   family history includes ADD / ADHD in his brother and son; Diabetes in his father and mother; Hyperlipidemia in his brother and mother; Stroke in his brother and mother.  Current Medications  Current Facility-Administered Medications:    0.9 %  sodium chloride infusion, , Intravenous, PRN, Darliss Cheney, MD, Last Rate: 10 mL/hr at 08/26/21 1010, New Bag at 08/26/21 1010   acetaminophen (TYLENOL) tablet 650 mg, 650 mg, Oral, Q6H PRN, 650 mg at 08/28/21 1540 **OR** acetaminophen (TYLENOL) suppository 650 mg, 650 mg, Rectal, Q6H PRN, Pahwani, Ravi, MD   albuterol (VENTOLIN HFA) 108 (90 Base) MCG/ACT inhaler 1 puff, 1 puff, Inhalation, Q4H PRN, Orma Flaming, MD, 1 puff at 08/25/21 0948   amLODipine (NORVASC) tablet 10 mg, 10 mg, Oral, Daily, Dahal, Binaya, MD, 10 mg at 08/28/21 0825   bisacodyl (DULCOLAX) suppository 10 mg, 10 mg, Rectal, Daily PRN, Agarwala, Ravi, MD, 10 mg at 08/14/21 1646   ceFAZolin (ANCEF) IVPB 2g/100 mL premix, 2 g, Intravenous, Q8H, Pahwani, Ravi, MD, Last Rate: 200 mL/hr at 08/28/21 1534, 2 g at 08/28/21 1534   Chlorhexidine Gluconate Cloth 2 % PADS 6 each, 6 each, Topical, Daily, Orma Flaming, MD, 6 each at 08/28/21 1057   collagenase (SANTYL) ointment, , Topical, Daily, Darliss Cheney, MD, Given at 08/28/21 1000   Darbepoetin Alfa (ARANESP) injection 150 mcg, 150 mcg, Subcutaneous, Q Mon-1800, Corliss Parish, MD, 150 mcg at 08/24/21 1719   feeding supplement (BOOST / RESOURCE BREEZE) liquid 1 Container, 1 Container, Oral, TID BM, Dahal, Marlowe Aschoff, MD, 1 Container at 08/28/21 1540   feeding supplement (PROSource TF) liquid 45 mL, 45 mL, Per Tube, TID, Dahal, Binaya, MD, 45 mL at 08/28/21 1535   feeding supplement (VITAL 1.5 CAL) liquid 1,000 mL, 1,000 mL, Per Tube, Q24H, Dahal, Binaya, MD, Last Rate: 75 mL/hr at 08/27/21 2008, 1,000 mL at 08/27/21 2008   gabapentin (NEURONTIN) capsule 200 mg, 200 mg, Oral, BID, Pahwani, Ravi, MD, 200 mg at 08/28/21 0825    hydrALAZINE (APRESOLINE) injection 10 mg, 10 mg, Intravenous, Q6H PRN, Marianna Payment, MD, 10 mg at 08/21/21 0453   hydrOXYzine (ATARAX/VISTARIL) tablet 10 mg, 10 mg, Oral, TID PRN, Dahal, Binaya, MD   insulin aspart (novoLOG) injection 0-20 Units, 0-20 Units, Subcutaneous, Q4H, Marianna Payment, MD, 3 Units at 08/28/21 1541   insulin glargine-yfgn (SEMGLEE) injection 12 Units, 12 Units, Subcutaneous, QHS, Dahal, Marlowe Aschoff, MD   lip balm (CARMEX) ointment, , Topical, PRN, Candee Furbish, MD, 75 application at 16/38/46 1616   living well with diabetes book MISC, , Does not apply, Once, Shawna Clamp, MD   MEDLINE mouth rinse, 15 mL, Mouth Rinse, BID, Candee Furbish, MD, 15 mL at 08/28/21 1355   metoprolol tartrate (LOPRESSOR) tablet 50 mg, 50 mg, Oral, BID, Pahwani, Ravi, MD, 50 mg at 08/28/21 0825   mupirocin ointment (BACTROBAN) 2 % 1 application, 1 application, Topical, BID, Shawna Clamp, MD, 1 application  at 08/28/21 1000   ondansetron (ZOFRAN) injection 4 mg, 4 mg, Intravenous, Q6H PRN, Marianna Payment, MD, 4 mg at 08/28/21 1962   oxyCODONE-acetaminophen (PERCOCET/ROXICET) 5-325 MG per tablet 1 tablet, 1 tablet, Oral, Q6H PRN, Terrilee Croak, MD, 1 tablet at 08/28/21 1438   pantoprazole (PROTONIX) EC tablet 40 mg, 40 mg, Oral, BID, Pahwani, Einar Grad, MD, 40 mg at 08/28/21 0825   polyethylene glycol (MIRALAX / GLYCOLAX) packet 17 g, 17 g, Oral, Daily PRN, Darliss Cheney, MD   senna-docusate (Senokot-S) tablet 2 tablet, 2 tablet, Oral, QHS PRN, Pahwani, Ravi, MD   sodium chloride flush (NS) 0.9 % injection 10-40 mL, 10-40 mL, Intracatheter, Q12H, Pahwani, Ravi, MD, 10 mL at 08/28/21 1055   sodium chloride flush (NS) 0.9 % injection 10-40 mL, 10-40 mL, Intracatheter, PRN, Darliss Cheney, MD   sodium phosphate (FLEET) 7-19 GM/118ML enema 1 enema, 1 enema, Rectal, Daily PRN, Bergman, Meghan D, NP   sucralfate (CARAFATE) 1 GM/10ML suspension 1 g, 1 g, Oral, TID WC & HS, Pahwani, Ravi, MD, 1 g at 08/28/21 1354    traZODone (DESYREL) tablet 50 mg, 50 mg, Oral, QHS PRN, Terrilee Croak, MD  Patients Current Diet:  Diet Order             DIET DYS 3 Room service appropriate? Yes with Assist; Fluid consistency: Thin  Diet effective now                  Precautions / Restrictions Precautions Precautions: Fall, Back Precaution Comments: reviewed back precautions for bed mobility, cortrak Restrictions Weight Bearing Restrictions: No   Has the patient had 2 or more falls or a fall with injury in the past year? 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 Independent  Indoor Mobility: Did the patient need assistance with walking from room to room (with or without device)? Independent  Stairs: Did the patient need assistance with internal or external stairs (with or without device)? Independent  Functional Cognition: Did the patient need help planning regular tasks such as shopping or remembering to take medications? Independent  Patient Information Are you of Hispanic, Latino/a,or Spanish origin?: A. No, not of Hispanic, Latino/a, or Spanish origin What is your race?: A. White Do you need or want an interpreter to communicate with a doctor or health care staff?: 0. No  Patient's Response To:  Health Literacy and Transportation Is the patient able to respond to health literacy and transportation needs?: Yes Health Literacy - How often do you need to have someone help you when you read instructions, pamphlets, or other written material from your doctor or pharmacy?: Never In the past 12 months, has lack of transportation kept you from medical appointments or from getting medications?: No In the past 12 months, has lack of transportation kept you from meetings, work, or from getting things needed for daily  living?: No  Home Assistive Devices / Bay Devices/Equipment: Radio producer (specify quad or straight) Home Equipment: Walker - 2 wheels, Cane - single point, [facility]  Prior Device Use: Indicate devices/aids used by the patient prior to current illness, exacerbation or injury? None of the above  Current Functional Level Cognition  Overall Cognitive Status: No family/caregiver present to determine baseline cognitive functioning Current Attention Level: Sustained Orientation Level: Oriented X4 Following Commands: Follows one step commands inconsistently Safety/Judgement: Decreased awareness  of safety, Decreased awareness of deficits General Comments: impulsive at times due to pain    Extremity Assessment (includes Sensation/Coordination)  Upper Extremity Assessment: Generalized weakness RUE Deficits / Details: gen weakness, decreased FM and GMC; assist to stretch hand into extension with preference to keep digits flexed LUE Deficits / Details: edema and gen weakness, decreased coordiantion (RN reports IV inflitrated)  Lower Extremity Assessment: Defer to PT evaluation    ADLs  Overall ADL's : Needs assistance/impaired Eating/Feeding: Total assistance, Sitting Eating/Feeding Details (indicate cue type and reason): SLP recommending diet Grooming: Maximal assistance, Sitting Grooming Details (indicate cue type and reason): suction oral care at EOB with hand over hand assist to complete, coordinate task Upper Body Bathing: Moderate assistance Lower Body Bathing: Maximal assistance Upper Body Dressing : Moderate assistance Lower Body Dressing: Maximal assistance Toilet Transfer Details (indicate cue type and reason): deferred Functional mobility during ADLs: +2 for physical assistance, +2 for safety/equipment, Rolling walker (2 wheels), Maximal assistance General ADL Comments: Pt completed bed mobility, stood 2x from EOB, rest break incorporated, min A +2. Able to side step to  left on first stand. Cues second to decreased attention span and a bit impulsive at times.    Mobility  Overal bed mobility: Needs Assistance Bed Mobility: Rolling, Sidelying to Sit Rolling: Min assist Sidelying to sit: Min assist Supine to sit: Mod assist Sit to supine: Mod assist, +2 for physical assistance, +2 for safety/equipment Sit to sidelying: Mod assist General bed mobility comments: assist to roll towars L and trunk elevation. Reinforced log roll technique    Transfers  Overall transfer level: Needs assistance Equipment used: Rolling walker (2 wheels) Transfers: Sit to/from Stand Sit to Stand: Min assist, +2 physical assistance, +2 safety/equipment, From elevated surface Stand pivot transfers: Mod assist, +2 physical assistance  Lateral/Scoot Transfers: Max assist, +2 physical assistance, +2 safety/equipment (more stand/scoot transfers up toward [facility]) General transfer comment: minA+2 to rise and steady. Bed partially elevated. Cues for hand placement to push up    Ambulation / Gait / Stairs / Wheelchair Mobility  Ambulation/Gait Ambulation/Gait assistance: Min assist, +2 physical assistance, +2 safety/equipment Gait Distance (Feet): 4 Feet Assistive device: Rolling walker (2 wheels) Gait Pattern/deviations: Step-through pattern, Decreased stride length, Scissoring, Trunk flexed, Narrow base of support General Gait Details: minA+2 for steadying and RW management for short ambulation towards recliner. Cues for close proximity to RW. Gait velocity: decreased    Posture / Balance Dynamic Sitting Balance Sitting balance - Comments: uses UE's for stability and to decrease pain. Balance Overall balance assessment: Needs assistance Sitting-balance support: Bilateral upper extremity supported, Feet supported Sitting balance-Leahy Scale: Fair Sitting balance - Comments: uses UE's for stability and to decrease pain. Standing balance support: Bilateral upper extremity supported,  During functional activity Standing balance-Leahy Scale: Poor Standing balance comment: pt unable to come fully upright without significant truncal/pelvic support and boost and use of the RW    Special needs/care consideration SIZEWISE speciality bed 10 fr cortrak left nare 10/19      Signed      Show:Clear all _0 Written_1 Templated_2 Copied  Added by: _3 Salley Slaughter, RN  _4 Hover for details                                  [place] Nurse wound follow up Patient receiving care in [facility] 3M09 Wound type: Evolving DTPI of the intergluteal cleft. Now unstageable with black base and painful to the  patient.  Back wound is 99% healed. Left big toe wound is 95% healed. Drainage (amount, consistency, odor) Brown sanguinous drainage on the dressing.  Periwound: intact Dressing procedure/placement/frequency: Clean the buttock area with no rinse cleanser. Pat dry and apply a nickel thick layer of Santyl over the blackened area, cover with a moistened saline gauze and secure with foam dressing. Carefully peel back foam dressing to apply santyl daily. May use sacral foam dressing up to 3 days unless soiled.   Monitor the wound area(s) for worsening of condition such as: Signs/symptoms of infection, increase in size, development of or worsening of odor, development of pain, or increased pain at the affected locations.   Notify the medical team if any of these develop.   Pressure Injury Prevention Bundle May use any that apply to this patient. Support surfaces (air mattress) chair cushion Kellie Simmering # [phone]) Heel offloading boots Kellie Simmering # [phone]) Turning and Positioning  Measures to reduce shear (draw sheet, knees up) Skin protection Products (Foam dressing) Moisture management products (Critic-Aid Barrier Cream (Purple top) Sween moisturizing lotion (Pink top in clean supply) Nutrition Management Protection for Medical Devices Routine Skin Assessment    Thank you for the consult. [place]  nurse will not follow at this time.   Please re-consult the [place] team if needed.   Cathlean Marseilles Tamala Julian, MSN, RN, CMSRN, AGCNS, [facility] Wound Treatment Associate Pager [phone]                 Previous Home Environment  Living Arrangements: Spouse/significant other  Lives With: Spouse Available Help at Discharge: Family, Available 24 hours/day Type of Home: House Home Layout: One level Home Access: Stairs to enter Entrance Stairs-Rails: None Entrance Stairs-Number of Steps: 2 Bathroom Shower/Tub: Optometrist: Yes How Accessible: Accessible via walker [place]: Yes Type of Home Care Services: Homehealth aide Additional Comments: interested in bench for tub  Discharge Living Setting Plans for Discharge Living Setting: Patient's home, Lives with (comment) (wife) Type of Home at Discharge: House Discharge Home Layout: One level Discharge Home Access: Stairs to enter Entrance Stairs-Rails: None Entrance Stairs-Number of Steps: 2 Discharge Bathroom Shower/Tub: Tub/shower unit Discharge Bathroom Toilet: Standard Discharge Bathroom Accessibility: Yes How Accessible: Accessible via walker Does the patient have any problems obtaining your medications?: No  Social/Family/Support Systems Patient Roles: Spouse, Parent Contact Information: Junie Panning, wife Anticipated Caregiver: wife Anticipated Ambulance person Information: see contacts Ability/Limitations of Caregiver: none Caregiver Availability: 24/7 Discharge Plan Discussed with Primary Caregiver: Yes Is Caregiver In Agreement with Plan?: Yes Does Caregiver/Family have Issues with Lodging/Transportation while Pt is in Rehab?: No  Goals Patient/Family Goal for Rehab: supevision to min with PT and OT, supervision with SLP Expected length of stay: ELOS 10 to 12 days Pt/Family Agrees to Admission and willing to participate: Yes Program Orientation Provided & Reviewed  with Pt/Caregiver Including Roles  & Responsibilities: Yes  Decrease burden of Care through IP rehab admission: n/a  Possible need for SNF placement upon discharge: not anticipated  Patient Condition: I have reviewed medical records from [facility], spoken with CM, and patient and spouse. I met with patient at the bedside for inpatient rehabilitation assessment.  Patient will benefit from ongoing PT, OT, and SLP, can actively participate in 3 hours of therapy a day 5 days of the week, and can make measurable gains during the admission.  Patient will also benefit from the coordinated team approach during an Inpatient Acute Rehabilitation admission.  The patient  will receive intensive therapy as well as Rehabilitation physician, nursing, social worker, and care management interventions.  Due to bladder management, bowel management, safety, skin/wound care, disease management, medication administration, pain management, and patient education the patient requires 24 hour a day rehabilitation nursing.  The patient is currently min to mod assist overall with mobility and basic ADLs.  Discharge setting and therapy post discharge at home with home health is anticipated.  Patient has agreed to participate in the Acute Inpatient Rehabilitation Program and will admit Sunday 11/6 when bed is available..  Preadmission Screen Completed By:  Cleatrice Burke, 08/28/2021 4:13 PM ______________________________________________________________________   Discussed status with Dr. Letta Pate on 08/28/2021 at 1600 and received approval for admission 11/6 when bed is available.  Admission Coordinator:  Cleatrice Burke, RN, time 1600 Date 08/28/2021   Assessment/Plan: Diagnosis:THoracic myelopathy Does the need for close, 24 hr/day Medical supervision in concert with the patient's rehab needs make it unreasonable for this patient to be served in a less intensive setting? Yes Co-Morbidities requiring  supervision/potential complications: Diabetes with neuropathy, retinopathy,s/p PNA and VDRF Due to bladder management, bowel management, safety, skin/wound care, disease management, medication administration, pain management, and patient education, does the patient require 24 hr/day rehab nursing? Yes Does the patient require coordinated care of a physician, rehab nurse, PT, OT, and SLP to address physical and functional deficits in the context of the above medical diagnosis(es)? Yes Addressing deficits in the following areas: balance, endurance, locomotion, strength, transferring, bowel/bladder control, bathing, dressing, feeding, grooming, toileting, and psychosocial support Can the patient actively participate in an intensive therapy program of at least 3 hrs of therapy 5 days a week? Yes The potential for patient to make measurable gains while on inpatient rehab is good Anticipated functional outcomes upon discharge from inpatient rehab: supervision and min assist PT, supervision and min assist OT, n/a SLP Estimated rehab length of stay to reach the above functional goals is: 10-12d Anticipated discharge destination: Home 10. Overall Rehab/Functional Prognosis: good   MD Signature: Charlett Blake M.D. [employer] Fellow Am Acad of Phys Med and Rehab Diplomate Am Board of Electrodiagnostic Med Fellow Am Board of Interventional Pain

## 2021-08-28 NOTE — Progress Notes (Signed)
Inpatient Rehabilitation Admissions Coordinator   I have received insurance approval for Cir admit and bed is available on Sunday 08/30/2021. I have met with patient and called his wife and they are in agreement. Acute team and TOC made aware. On Sunday, Dr Letta Pate will round on patient . 4NP can call rehab nurse at [phone] at 12 noon on Sunday to give report and clarify room number and when bed will be available. I will make the arrangements.  Danne Baxter, RN, MSN Rehab Admissions Coordinator [phone] 08/28/2021 4:16 PM

## 2021-08-28 NOTE — Progress Notes (Signed)
Physical Therapy Treatment Patient Details Name: Jose [name] MRN: [id number] DOB: [birth year] Today's Date: 08/28/2021   History of Present Illness 45 y.o. male admitted on 08/06/2021 with worsening back pain, S/P T4-5 laminectomy, LT5 pediculotomy, and decompression of spinal cord on 10/14.  Pt with acute hypoxic respiratory failure in setting of recent GI bleed and recent spinal surgery. Endoscopy 10/17. Intubated 10/19-10/24. PMhx: T2DM, retinopathy, neuropathy, HTN, HLD, CKD, covid July 2022.    PT Comments    Patient progressing OOB this date with minA+2 and use of RW. Patient impulsive at times due to back pain requiring cues for slowed pace and close proximity. Patient on 2L O2 [place] during session with spO2 >90% throughout. Educated patient on importance of OOB mobility and benefits of exercise. Continue to recommend CIR level therapies to assist with maximizing functional mobility and safety.    Recommendations for follow up therapy are one component of a multi-disciplinary discharge planning process, led by the attending physician.  Recommendations may be updated based on patient status, additional functional criteria and insurance authorization.  Follow Up Recommendations  Acute inpatient rehab (3hours/day)     Assistance Recommended at Discharge Frequent or constant Supervision/Assistance  Equipment Recommendations  Jose Jose [name] (2 wheels)    Recommendations for Other Services       Precautions / Restrictions Precautions Precautions: Fall;Back Precaution Comments: reviewed back precautions for bed mobility, cortrak Restrictions Weight Bearing Restrictions: No     Mobility  Bed Mobility Overal bed mobility: Needs Assistance Bed Mobility: Jose;Sidelying to Sit Jose: Min assist Sidelying to sit: Min assist       General bed mobility comments: assist to roll towars L and trunk elevation. Reinforced log roll technique    Transfers Overall transfer level:  Needs assistance Equipment used: Jose [name] (2 wheels) Transfers: Sit to/from Stand Sit to Stand: Min assist;+2 physical assistance;+2 safety/equipment;From elevated surface           General transfer comment: minA+2 to rise and steady. Bed partially elevated. Cues for hand placement to push up    Ambulation/Gait Ambulation/Gait assistance: Min assist;+2 physical assistance;+2 safety/equipment Gait Distance (Feet): 4 Feet Assistive device: Jose [name] (2 wheels) Gait Pattern/deviations: Step-through pattern;Decreased stride length;Scissoring;Trunk flexed;Narrow base of support Gait velocity: decreased   General Gait Details: minA+2 for steadying and RW management for short ambulation towards recliner. Cues for close proximity to RW.   Stairs             Wheelchair Mobility    Modified Rankin (Stroke Patients Only)       Balance Overall balance assessment: Needs assistance Sitting-balance support: Bilateral upper extremity supported;Feet supported Sitting balance-Leahy Scale: Fair     Standing balance support: Bilateral upper extremity supported;During functional activity Standing balance-Leahy Scale: Poor                              Cognition Arousal/Alertness: Awake/alert Behavior During Therapy: Restless Overall Cognitive Status: No family/caregiver present to determine baseline cognitive functioning Area of Impairment: Attention;Following commands;Memory;Safety/judgement;Awareness;Problem solving                   Current Attention Level: Sustained Memory: Decreased short-term memory;Decreased recall of precautions Following Commands: Follows one step commands inconsistently Safety/Judgement: Decreased awareness of safety;Decreased awareness of deficits Awareness: Emergent Problem Solving: Slow processing General Comments: impulsive at times due to pain        Exercises      General Comments  Pertinent  Vitals/Pain Pain Assessment: Faces Faces Pain Scale: Hurts whole lot Pain Location: back Pain Descriptors / Indicators: Grimacing;Restless Pain Intervention(s): Monitored during session;Repositioned;Patient requesting pain meds-RN notified    Home Living                          Prior Function            PT Goals (current goals can now be found in the care plan section) Acute Rehab PT Goals Patient Stated Goal: less pain PT Goal Formulation: With patient Time For Goal Achievement: 09/09/21 Potential to Achieve Goals: Good Progress towards PT goals: Progressing toward goals    Frequency    Min 3X/week      PT Plan Current plan remains appropriate    Co-evaluation              AM-PAC PT "6 Clicks" Mobility   Outcome Measure  Help needed turning from your back to your side while in a flat bed without using bedrails?: A Little Help needed moving from lying on your back to sitting on the side of a flat bed without using bedrails?: A Little Help needed moving to and from a bed to a chair (including a wheelchair)?: Total Help needed standing up from a chair using your arms (e.g., wheelchair or bedside chair)?: Total Help needed to walk in hospital room?: Total Help needed climbing 3-5 steps with a railing? : Total 6 Click Score: 10    End of Session Equipment Utilized During Treatment: Oxygen;Gait belt Activity Tolerance: Patient limited by pain Patient left: in chair;with call bell/phone within reach;with nursing/sitter in room Nurse Communication: Mobility status PT Visit Diagnosis: Other abnormalities of gait and mobility (R26.89);Muscle weakness (generalized) (M62.81);Pain     Time: 1425-1449 PT Time Calculation (min) (ACUTE ONLY): 24 min  Charges:  $Gait Training: 8-22 mins $Therapeutic Activity: 8-22 mins                     Kaylen Motl A. Gilford Rile PT, DPT Acute Rehabilitation Services Pager [phone] Office [phone]   Linna Hoff 08/28/2021, 3:44 PM

## 2021-08-28 NOTE — Progress Notes (Signed)
Jose [name]  DOB: [birth year]  PCP: [name], NP RUE:[id number]  DOA: 08/06/2021  LOS: 53 days  Hospital Day: 23  Chief complaint: Worsening back pain  Brief narrative: Jose [name] is a 45 y.o. male with PMH significant for DM2 with complications including retinopathy, neuropathy, microalbuminuria; HTN, HLD, CKD 3 (baseline creatinine 1.1-1.2) who was last hospitalized from 9/28-10/10 for MSSA bacteremia/T4-T5 osteomyelitis and discharged home on IV antibiotics.  However he continued to have worsening back pain and was barely able to get up out of his bed and hence brought back to ED on 10/14.   MRI thoracic spine confirmed the finding and also showed a possible paraspinal phlegmon, interval T5 pathological fracture and new 4 mm of bony retropulsion.  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  There are also multiple small subcentimeter fluid collection in the left posterior paraspinal area suggestive of tiny abscesses.  Admitted to hospitalist service. Neurosurgery consultation was obtained. On 10/14, patient underwent a 45 laminectomy, left T5 pedicle ectomy and spinal cord decompression. His hospital course was also complicated by acute anemia requiring EGD, acute hypoxic respiratory failure requiring intubation, successfully extubated  10/17 - Hgb drop to 6.2, coffee ground emesis s/p EGD showed circumferential esophagitis, nonbleeding duodenal ulcers and old blood in the stomach 10/17 - hypoxia and pccm consult  10/19-10/24 -mechanical ventilation with successful extubation 10/25 - Transferred to [facility]  See below for details.   Subjective: Patient was seen and examined this morning.  Lying on bed.  Looks sleepy.  Per nursing staff, he is not so motivated to eat or move.  Yesterday he had hypoglycemic episode after which I stopped scheduled short-acting insulin and started him  on dextrose drip.  Overnight blood sugar level was high.  Dextrose drip was stopped today.  Assessment/Plan: Acute T4-T5 osteomyelitis MSSA bacteremia -Imaging and procedures as mentioned above. -Intra-Op culture grew MSSA -Per ID recommendation, patient is currently on IV Ancef.  Acute hypoxic respiratory failure Aspiration pneumonia -On 10/17, patient was noted to be hypoxic which worsened in he required intubation on 10/19, subsequently extubated on 10/24.  This was suspected to be due to aspiration pneumonia.  He completed a course of Unasyn. -Repeat chest x-ray on 10/30 showed persistent diffuse but asymmetric interstitial and airspace process. -Clinically patient has no fever, WBC count improved to normal but he continues to have crackles on left lung base. -Recent echo from 10/5 showed EF of 60 to 65%. -PE was also considered.  VQ scan showed intermediate probability.  DVT scan of lower extremities were negative.  Patient could not be anticoagulated empirically because of active GI bleeding. -Repeat chest x-ray this morning 11/3 showed hypoventilation and progression of airspace disease in the left likely due to atelectasis or infiltrate. -Aspiration precautions. Recent Labs  Lab 08/24/21 0435 08/25/21 0525 08/26/21 0345 08/27/21 0320 08/28/21 0346  WBC 16.6* 10.8* 9.7 10.9* 8.4   Dysphagia Malnutrition -Probably related to esophagitis and generalized deconditioning.  Albumin level significantly low. -speech therapy following.  Regular diet allowed but poor oral intake.  Encourage oral intake and hydration. -Currently also on nightly tube feeding with core track.   Recent Labs  Lab 08/22/21 0530 08/23/21 0450 08/24/21 0435 08/25/21 0525 08/26/21 0345  ALBUMIN 1.8* 1.8* 1.8* 1.7* 1.8*   Acute blood loss anemia Acute GI bleeding Candida esophagitis -EGD 10/17 showed showed circumferential esophagitis, nonbleeding duodenal ulcers and old blood  in the stomach.   Esophageal biopsy confirmed Candida esophagitis.  Patient completed a course of Diflucan on 11/2. -Patient continued to have intermittent GI bleeding with drop in hemoglobin and hence underwent repeat EGD on 10/31.  It showed healing esophagitis did not show any active signs of bleeding. -Currently on Protonix and tube feedings. -Received a total of 11 units of PRBC this hospitalization.  For the last 5 days, hemoglobin is stable between 8 and 9. -Continue to monitor Recent Labs    05/18/21 1703 05/19/21 0431 05/20/21 0359 07/22/21 1051 07/23/21 0508 07/24/21 0550 08/24/21 0435 08/25/21 0525 08/26/21 0345 08/27/21 0320 08/28/21 0346  HGB 11.2*   < > 9.0*   < > 8.6*   < > 8.5* 8.5* 8.7* 8.9* 8.8*  MCV 84.9   < > 87.3   < > 81.9   < > 92.3 92.8 92.5 93.5 94.8  VITAMINB12  --   --  1,734*  --  252  --   --   --   --   --   --   FOLATE  --   --  43.8  --  13.3  --   --   --   --   --   --   FERRITIN 167  --  126  --  193  --   --   --   --   --   --   TIBC  --   --  250  --  223*  --   --   --   --   --   --   IRON  --   --  59  --  21*  --   --   --   --   --   --   RETICCTPCT  --   --   --   --  1.1  --   --   --   --   --   --    < > = values in this interval not displayed.   AKI on CKD 3a -Baseline creatinine 1.1-1.2. -Creatinine worsened to peak at 4.53, probably because of ATN. Creatinine is gradually improving, 1.52 today.  Nephrology signed off. Recent Labs    08/19/21 2155 08/20/21 0327 08/21/21 0515 08/22/21 0530 08/23/21 0450 08/24/21 0435 08/25/21 0525 08/26/21 0345 08/27/21 0320 08/28/21 0346  BUN 119* 114* 96* 76* 81* 70* 60* 52* 49* 47*  CREATININE 3.00* 2.94* 2.26* 1.99* 2.21* 2.18* 2.02* 1.83* 1.64* 1.52*   Hypernatremia -Sodium level was at a peak of 156 on 10/28.  Resolved.  Currently in normal range. Recent Labs  Lab 08/22/21 0530 08/23/21 0450 08/24/21 0435 08/25/21 0525 08/26/21 0345 08/27/21 0320 08/28/21 0346  NA 139 149* 144 143 142 144  141   Type 2 diabetes mellitus Hypoglycemic episodes -A1c 7.5 on 8/18.  Patient had hypoglycemic episode yesterday after continuous tube feeding was discontinued and switched to nightly only.  I stopped scheduled glimepiride and short-acting insulin.  Overnight I kept him on dextrose drip with improvement in blood sugar level to over 300 this morning. -I would continue Lantus at 12 units daily with sliding scale insulin.  Continue to monitor Recent Labs  Lab 08/27/21 1936 08/27/21 2327 08/28/21 0332 08/28/21 0751 08/28/21 1117  GLUCAP 166* 286* 337* 247* 255*   Essential hypertension -Blood pressure running mostly above 140s in last 24 hours. -Prior to admission, patient was on verapamil.  Currently on Lopressor 50 mg twice daily and amlodipine  5 mg daily.  Blood pressure running elevated.  Increased amlodipine dose to 10 mg this morning.    Sacral decubitus ulcer stage II - POA -wound care on board.  Constipation -10/13, CT abdomen showed large burden of stool and stool balls throughout the colon  Acute urinary retention -CT abdomen on admission showed distended urinary bladder with urinary retention, probably related to constipation.  Foley catheter was inserted.  Remove Foley catheter, voiding trial today.  Mobility: PT following.  Moderate assistance required for bed mobility.  CIR pending Living condition: Was living at home with his wife Goals of care:   Code Status: Full Code  Nutritional status: Body mass index is 29.85 kg/m. Nutrition Problem: Increased nutrient needs Etiology: post-op healing Signs/Symptoms: estimated needs Diet:  Diet Order             DIET DYS 3 Room service appropriate? Yes with Assist; Fluid consistency: Thin  Diet effective now                  DVT prophylaxis:  SCDs Start: 08/06/21 1536   Antimicrobials: IV Ancef Fluid: None Consultants: Neurosurgery, critical care, ID, nephrology signed off Family Communication: Wife at  bedside  Remains inpatient appropriate because: Patient continues to have core track, Foley catheter, oxygen requirement, severe deconditioning  Dispo: The patient is from: Home              Anticipated d/c is to: CIR              Patient currently is not medically stable to d/c.   Difficult to place patient No     Infusions:   sodium chloride 10 mL/hr at 08/26/21 1010    ceFAZolin (ANCEF) IV 2 g (08/28/21 0502)    Scheduled Meds:  amLODipine  10 mg Oral Daily   Chlorhexidine Gluconate Cloth  6 each Topical Daily   collagenase   Topical Daily   darbepoetin (ARANESP) injection - NON-DIALYSIS  150 mcg Subcutaneous Q Mon-1800   feeding supplement  1 Container Oral TID BM   feeding supplement (PROSource TF)  45 mL Per Tube TID   feeding supplement (VITAL 1.5 CAL)  1,000 mL Per Tube Q24H   gabapentin  200 mg Oral BID   insulin aspart  0-20 Units Subcutaneous Q4H   insulin glargine-yfgn  12 Units Subcutaneous QHS   living well with diabetes book   Does not apply Once   mouth rinse  15 mL Mouth Rinse BID   metoprolol tartrate  50 mg Oral BID   mupirocin ointment  1 application Topical BID   pantoprazole  40 mg Oral BID   sodium chloride flush  10-40 mL Intracatheter Q12H   sucralfate  1 g Oral TID WC & HS    PRN meds: sodium chloride, acetaminophen **OR** acetaminophen, albuterol, bisacodyl, hydrALAZINE, hydrOXYzine, lip balm, ondansetron (ZOFRAN) IV, oxyCODONE-acetaminophen, polyethylene glycol, senna-docusate, sodium chloride flush, sodium phosphate, traZODone   Antimicrobials: Anti-infectives (From admission, onward)    Start     Dose/Rate Route Frequency Ordered Stop   08/21/21 1300  ceFAZolin (ANCEF) IVPB 2g/100 mL premix        2 g 200 mL/hr over 30 Minutes Intravenous Every 8 hours 08/21/21 0918     08/21/21 1000  fluconazole (DIFLUCAN) tablet 400 mg  Status:  Discontinued        400 mg Per Tube Daily 08/20/21 1104 08/21/21 0925   08/21/21 1000  fluconazole (DIFLUCAN)  IVPB 400 mg  400 mg 100 mL/hr over 120 Minutes Intravenous Every 24 hours 08/21/21 0925 08/26/21 1059   08/20/21 1200  fluconazole (DIFLUCAN) tablet 200 mg        200 mg Per Tube  Once 08/20/21 1104 08/20/21 1207   08/20/21 1000  fluconazole (DIFLUCAN) tablet 200 mg  Status:  Discontinued        200 mg Per Tube Daily 08/20/21 0825 08/20/21 1104   08/20/21 0047  Ampicillin-Sulbactam (UNASYN) 3 g in sodium chloride 0.9 % 100 mL IVPB  Status:  Discontinued        3 g 200 mL/hr over 30 Minutes Intravenous Every 12 hours 08/19/21 1619 08/19/21 1621   08/19/21 2100  Ampicillin-Sulbactam (UNASYN) 3 g in sodium chloride 0.9 % 100 mL IVPB  Status:  Discontinued        3 g 200 mL/hr over 30 Minutes Intravenous Every 8 hours 08/19/21 1621 08/21/21 0918   08/19/21 1200  Ampicillin-Sulbactam (UNASYN) 3 g in sodium chloride 0.9 % 100 mL IVPB  Status:  Discontinued        3 g 200 mL/hr over 30 Minutes Intravenous Every 8 hours 08/19/21 1055 08/19/21 1619   08/19/21 1015  fluconazole (DIFLUCAN) IVPB 200 mg  Status:  Discontinued        200 mg 100 mL/hr over 60 Minutes Intravenous Every 24 hours 08/19/21 0916 08/20/21 0825   08/17/21 2300  ceFAZolin (ANCEF) IVPB 2g/100 mL premix  Status:  Discontinued        2 g 200 mL/hr over 30 Minutes Intravenous Every 12 hours 08/17/21 1054 08/19/21 1055   08/16/21 1000  fluconazole (DIFLUCAN) tablet 200 mg  Status:  Discontinued        200 mg Per Tube Daily 08/15/21 1414 08/19/21 0916   08/15/21 2314  Ampicillin-Sulbactam (UNASYN) 3 g in sodium chloride 0.9 % 100 mL IVPB  Status:  Discontinued        3 g 200 mL/hr over 30 Minutes Intravenous Every 12 hours 08/15/21 1410 08/17/21 1054   08/13/21 1000  fluconazole (DIFLUCAN) tablet 400 mg  Status:  Discontinued        400 mg Per Tube Daily 08/12/21 1148 08/15/21 1414   08/12/21 1000  fluconazole (DIFLUCAN) tablet 400 mg  Status:  Discontinued        400 mg Oral Daily 08/12/21 0913 08/12/21 1148   08/11/21 1800   vancomycin (VANCOREADY) IVPB 1250 mg/250 mL  Status:  Discontinued        1,250 mg 166.7 mL/hr over 90 Minutes Intravenous Every 24 hours 08/10/21 1651 08/11/21 0939   08/11/21 1030  ceFEPIme (MAXIPIME) 2 g in sodium chloride 0.9 % 100 mL IVPB  Status:  Discontinued        2 g 200 mL/hr over 30 Minutes Intravenous Every 12 hours 08/11/21 0936 08/11/21 0939   08/11/21 1030  Ampicillin-Sulbactam (UNASYN) 3 g in sodium chloride 0.9 % 100 mL IVPB  Status:  Discontinued        3 g 200 mL/hr over 30 Minutes Intravenous Every 6 hours 08/11/21 0940 08/15/21 1410   08/10/21 1730  vancomycin (VANCOREADY) IVPB 1750 mg/350 mL        1,750 mg 175 mL/hr over 120 Minutes Intravenous  Once 08/10/21 1651 08/10/21 1956   08/10/21 1715  piperacillin-tazobactam (ZOSYN) IVPB 3.375 g  Status:  Discontinued        3.375 g 12.5 mL/hr over 240 Minutes Intravenous Every 8 hours 08/10/21 1651 08/11/21 0936  08/06/21 2200  ceFAZolin (ANCEF) IVPB 2g/100 mL premix  Status:  Discontinued        2 g 200 mL/hr over 30 Minutes Intravenous Every 8 hours 08/06/21 1829 08/10/21 1631   08/06/21 1415  vancomycin (VANCOREADY) IVPB 1750 mg/350 mL  Status:  Discontinued        1,750 mg 175 mL/hr over 120 Minutes Intravenous Every 24 hours 08/06/21 1400 08/06/21 1829   08/06/21 1415  ceFEPIme (MAXIPIME) 2 g in sodium chloride 0.9 % 100 mL IVPB  Status:  Discontinued        2 g 200 mL/hr over 30 Minutes Intravenous Every 8 hours 08/06/21 1400 08/06/21 1829       Objective: Vitals:   08/28/21 0753 08/28/21 1119  BP: (!) 175/88 140/75  Pulse: 99 73  Resp: 19 14  Temp: 98.1 F (36.7 C) 97.6 F (36.4 C)  SpO2: 95% 96%    Intake/Output Summary (Last 24 hours) at 08/28/2021 1335 Last data filed at 08/28/2021 1121 Gross per 24 hour  Intake 1636.49 ml  Output 1800 ml  Net -163.51 ml   Filed Weights   08/26/21 0419 08/27/21 0431 08/28/21 0500  Weight: 91.6 kg 93 kg 97.1 kg   Weight change: 4.082 kg Body mass index is  29.85 kg/m.   Physical Exam: General exam: Pleasant middle-aged Caucasian male.  Looks older for his age. Skin: No rashes, lesions or ulcers. HEENT: Atraumatic, normocephalic, no obvious bleeding Lungs: Continues to have coarse crackles on the left side CVS: Regular rate and rhythm, no murmur GI/Abd soft, nontender, nondistended, bowels are present CNS: Looks sleepy. Psychiatry: Mood appropriate Extremities: No pedal edema, no calf tenderness  Data Review: I have personally reviewed the laboratory data and studies available.  F/u labs ordered Unresulted Labs (From admission, onward)     Start     Ordered   08/27/21 0500  CBC with Differential/Platelet  Daily,   R     Question:  Specimen collection method  Answer:  Unit=Unit collect   08/26/21 1415   08/27/21 1517  Basic metabolic panel  Daily,   R     Question:  Specimen collection method  Answer:  Unit=Unit collect   08/26/21 1415            Signed, Terrilee Croak, MD [facility] 08/28/2021

## 2021-08-28 NOTE — Progress Notes (Signed)
Inpatient Rehabilitation Admissions Coordinator   I spoke with pt's wife, Junie Panning, by phone. She is in agreement for need for Cir admit. I await Insurance approval to admit when medically ready.  Danne Baxter, RN, MSN Rehab Admissions Coordinator [phone] 08/28/2021 11:35 AM

## 2021-08-29 DIAGNOSIS — M4624 Osteomyelitis of vertebra, thoracic region: Secondary | ICD-10-CM | POA: Diagnosis not present

## 2021-08-29 LAB — CBC WITH DIFFERENTIAL/PLATELET
Abs Immature Granulocytes: 0.06 10*3/uL (ref 0.00–0.07)
Basophils Absolute: 0.1 10*3/uL (ref 0.0–0.1)
Basophils Relative: 1 %
Eosinophils Absolute: 0.8 10*3/uL — ABNORMAL HIGH (ref 0.0–0.5)
Eosinophils Relative: 9 %
HCT: 28.8 % — ABNORMAL LOW (ref 39.0–52.0)
Hemoglobin: 8.5 g/dL — ABNORMAL LOW (ref 13.0–17.0)
Immature Granulocytes: 1 %
Lymphocytes Relative: 15 %
Lymphs Abs: 1.4 10*3/uL (ref 0.7–4.0)
MCH: 28.3 pg (ref 26.0–34.0)
MCHC: 29.5 g/dL — ABNORMAL LOW (ref 30.0–36.0)
MCV: 96 fL (ref 80.0–100.0)
Monocytes Absolute: 1.1 10*3/uL — ABNORMAL HIGH (ref 0.1–1.0)
Monocytes Relative: 12 %
Neutro Abs: 5.5 10*3/uL (ref 1.7–7.7)
Neutrophils Relative %: 62 %
Platelets: 318 10*3/uL (ref 150–400)
RBC: 3 MIL/uL — ABNORMAL LOW (ref 4.22–5.81)
RDW: 18.6 % — ABNORMAL HIGH (ref 11.5–15.5)
WBC: 8.9 10*3/uL (ref 4.0–10.5)
nRBC: 0 % (ref 0.0–0.2)

## 2021-08-29 LAB — GLUCOSE, CAPILLARY
Glucose-Capillary: 256 mg/dL — ABNORMAL HIGH (ref 70–99)
Glucose-Capillary: 230 mg/dL — ABNORMAL HIGH (ref 70–99)
Glucose-Capillary: 191 mg/dL — ABNORMAL HIGH (ref 70–99)
Glucose-Capillary: 136 mg/dL — ABNORMAL HIGH (ref 70–99)
Glucose-Capillary: 107 mg/dL — ABNORMAL HIGH (ref 70–99)
Glucose-Capillary: 144 mg/dL — ABNORMAL HIGH (ref 70–99)

## 2021-08-29 LAB — BASIC METABOLIC PANEL
Anion gap: 5 (ref 5–15)
BUN: 48 mg/dL — ABNORMAL HIGH (ref 6–20)
CO2: 21 mmol/L — ABNORMAL LOW (ref 22–32)
Calcium: 8.2 mg/dL — ABNORMAL LOW (ref 8.9–10.3)
Chloride: 112 mmol/L — ABNORMAL HIGH (ref 98–111)
Creatinine, Ser: 1.56 mg/dL — ABNORMAL HIGH (ref 0.61–1.24)
GFR, Estimated: 55 mL/min — ABNORMAL LOW (ref >60)
Glucose, Bld: 277 mg/dL — ABNORMAL HIGH (ref 70–99)
Potassium: 4.2 mmol/L (ref 3.5–5.1)
Sodium: 138 mmol/L (ref 135–145)

## 2021-08-29 MED ORDER — PROSOURCE TF PO LIQD: 45.0000 mL | Freq: Three times a day (TID) | ORAL | Status: DC

## 2021-08-29 MED ORDER — CEFAZOLIN IV (FOR PTA / DISCHARGE USE ONLY): 2.0000 g | Freq: Three times a day (TID) | INTRAVENOUS | 0 refills | Status: DC

## 2021-08-29 MED ORDER — SUCRALFATE 1 GM/10ML PO SUSP: 1.0000 g | Freq: Three times a day (TID) | ORAL | 0 refills | Status: DC

## 2021-08-29 MED ORDER — VITAL 1.5 CAL PO LIQD: 1000.0000 mL | ORAL | Status: DC

## 2021-08-29 MED ORDER — POLYETHYLENE GLYCOL 3350 17 G PO PACK: 17.0000 g | PACK | Freq: Every day | ORAL | 0 refills | Status: AC | PRN

## 2021-08-29 MED ORDER — PANTOPRAZOLE SODIUM 40 MG PO TBEC: 40.0000 mg | DELAYED_RELEASE_TABLET | Freq: Two times a day (BID) | ORAL | Status: DC

## 2021-08-29 MED ORDER — INSULIN ASPART 100 UNIT/ML IJ SOLN: 0.0000 [IU] | INTRAMUSCULAR | 11 refills | Status: DC

## 2021-08-29 MED ORDER — BISACODYL 10 MG RE SUPP: 10.0000 mg | Freq: Every day | RECTAL | 0 refills | Status: DC | PRN

## 2021-08-29 MED ORDER — OXYCODONE-ACETAMINOPHEN 5-325 MG PO TABS: 1.0000 | ORAL_TABLET | Freq: Four times a day (QID) | ORAL | 0 refills | Status: DC | PRN

## 2021-08-29 MED ORDER — GABAPENTIN 100 MG PO CAPS: 200.0000 mg | ORAL_CAPSULE | Freq: Two times a day (BID) | ORAL | Status: DC

## 2021-08-29 MED ORDER — METOPROLOL TARTRATE 50 MG PO TABS: 50.0000 mg | ORAL_TABLET | Freq: Two times a day (BID) | ORAL | Status: DC

## 2021-08-29 MED ORDER — INSULIN GLARGINE-YFGN 100 UNIT/ML ~~LOC~~ SOLN: 12.0000 [IU] | Freq: Every day | SUBCUTANEOUS | 11 refills | Status: DC

## 2021-08-29 MED ORDER — CYCLOBENZAPRINE HCL 10 MG PO TABS
10.0000 mg | ORAL_TABLET | Freq: Three times a day (TID) | ORAL | 0 refills | Status: DC | PRN
Start: 2021-08-29 — End: 2021-09-21

## 2021-08-29 MED ORDER — DARBEPOETIN ALFA 150 MCG/0.3ML IJ SOSY: 150.0000 ug | PREFILLED_SYRINGE | INTRAMUSCULAR | Status: DC

## 2021-08-29 NOTE — Discharge Summary (Signed)
Physician Discharge Summary  Jose [name] SMO:[id number] DOB: 10/06/1976 DOA: 08/06/2021  PCP: [name], NP  Admit date: 08/06/2021 Discharge date: 08/29/2021  Admitted From: Home Discharge disposition: CIR   Code Status: Full Code   Discharge Diagnosis:   Principal Problem:   Osteomyelitis of thoracic spine ([place]) Active Problems:   Type 2 diabetes mellitus with diabetic polyneuropathy, without long-term current use of insulin (HCC)   Hyperlipidemia   GAD (generalized anxiety disorder)   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)   Essential hypertension   CKD (chronic kidney disease) stage 3, GFR 30-59 ml/min (HCC)   Pleural effusion, bilateral    Chief complaint: Worsening back pain  Brief narrative: Jose [name] is a 45 y.o. male with PMH significant for DM2 with complications including retinopathy, neuropathy, microalbuminuria; HTN, HLD, CKD 3 (baseline creatinine 1.1-1.2) who was last hospitalized from 9/28-10/10 for MSSA bacteremia/T4-T5 osteomyelitis and discharged home on IV antibiotics.  However he continued to have worsening back pain and was barely able to get up out of his bed and hence brought back to ED on 10/14.   MRI thoracic spine confirmed the finding and also showed a possible paraspinal phlegmon, interval T5 pathological fracture and new 4 mm of bony retropulsion.  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  There are also multiple small subcentimeter fluid collection in the left posterior paraspinal area suggestive of tiny abscesses.  Admitted to hospitalist service. Neurosurgery consultation was obtained. On 10/14, patient underwent a 45 laminectomy, left T5 pedicle ectomy and spinal cord decompression. His hospital course was also complicated by acute anemia requiring EGD, acute hypoxic respiratory failure requiring intubation, successfully extubated  10/17 - Hgb drop to  6.2, coffee ground emesis s/p EGD showed circumferential esophagitis, nonbleeding duodenal ulcers and old blood in the stomach 10/17 - hypoxia and pccm consult  10/19-10/24 -mechanical ventilation with successful extubation 10/25 - Transferred to [facility]  See below for details.   Subjective: Patient was seen and examined this morning.  Sitting up in chair.  He was sweating.  Stat blood sugar level was checked and it was 191.  Patient denies any chest pain, shortness of breath. In last 24 hours, his blood sugar level has been wildly fluctuant.  Hospital course: Acute T4-T5 osteomyelitis MSSA bacteremia -Imaging and procedures as mentioned above. -Intra-Op culture grew MSSA -Per ID recommendation, patient is currently on IV Ancef, planned for a total of 6 weeks until 09/24/2021.  Acute hypoxic respiratory failure Aspiration pneumonia -On 10/17, patient was noted to be hypoxic which worsened in he required intubation on 10/19, subsequently extubated on 10/24.  This was suspected to be due to aspiration pneumonia.  He completed a course of Unasyn. -Repeat chest x-ray on 10/30 showed persistent diffuse but asymmetric interstitial and airspace process. -Clinically patient has no fever, WBC count improved to normal but he continues to have crackles on left lung base. -Recent echo from 10/5 showed EF of 60 to 65%. -PE was also considered.  VQ scan showed intermediate probability.  DVT scan of lower extremities were negative. Patient could not be anticoagulated empirically because of active GI bleeding. -Repeat chest x-ray this morning 11/3 showed hypoventilation and progression of airspace disease in the left likely due to atelectasis or infiltrate. -Follow aspiration precautions. Recent Labs  Lab 08/25/21 0525 08/26/21 0345 08/27/21 0320 08/28/21 0346 08/29/21 0353  WBC 10.8* 9.7 10.9* 8.4 8.9   Dysphagia Malnutrition -  Probably related to esophagitis and generalized deconditioning.  Albumin  level significantly low. -speech therapy following.  Regular diet allowed but poor oral intake.  Encourage oral intake and hydration. -Currently also on nightly tube feeding with core track.  We will discharge him to CIR with feeding tube in place.  Once his oral intake picks up, feeding tube can be removed. Recent Labs  Lab 08/23/21 0450 08/24/21 0435 08/25/21 0525 08/26/21 0345  ALBUMIN 1.8* 1.8* 1.7* 1.8*   Acute blood loss anemia Acute GI bleeding Candida esophagitis -EGD 10/17 showed showed circumferential esophagitis, nonbleeding duodenal ulcers and old blood in the stomach.  Esophageal biopsy confirmed Candida esophagitis.  Patient completed a course of Diflucan on 11/2. -Patient continued to have intermittent GI bleeding with drop in hemoglobin and hence underwent repeat EGD on 10/31.  It showed healing esophagitis did not show any active signs of bleeding. -Currently on Protonix and tube feedings. -Received a total of 11 units of PRBC this hospitalization.  For the last 5 days, hemoglobin is stable between 8 and 9. -Continue to monitor Recent Labs    05/18/21 1703 05/19/21 0431 05/20/21 0359 07/22/21 1051 07/23/21 0508 07/24/21 0550 08/25/21 0525 08/26/21 0345 08/27/21 0320 08/28/21 0346 08/29/21 0353  HGB 11.2*   < > 9.0*   < > 8.6*   < > 8.5* 8.7* 8.9* 8.8* 8.5*  MCV 84.9   < > 87.3   < > 81.9   < > 92.8 92.5 93.5 94.8 96.0  VITAMINB12  --   --  1,734*  --  252  --   --   --   --   --   --   FOLATE  --   --  43.8  --  13.3  --   --   --   --   --   --   FERRITIN 167  --  126  --  193  --   --   --   --   --   --   TIBC  --   --  250  --  223*  --   --   --   --   --   --   IRON  --   --  59  --  21*  --   --   --   --   --   --   RETICCTPCT  --   --   --   --  1.1  --   --   --   --   --   --    < > = values in this interval not displayed.   AKI on CKD 3a -Baseline creatinine 1.1-1.2. -Creatinine worsened to peak at 4.53, probably because of ATN. Creatinine is  gradually improving, 1.56 today.  Recent Labs    08/20/21 0327 08/21/21 0515 08/22/21 0530 08/23/21 0450 08/24/21 0435 08/25/21 0525 08/26/21 0345 08/27/21 0320 08/28/21 0346 08/29/21 0353  BUN 114* 96* 76* 81* 70* 60* 52* 49* 47* 48*  CREATININE 2.94* 2.26* 1.99* 2.21* 2.18* 2.02* 1.83* 1.64* 1.52* 1.56*   Type 2 diabetes mellitus Hypoglycemic episodes -A1c 7.5 on 8/18.  Patient has poor oral intake and is inconsistent.  He is getting nightly tube feedings.  His blood sugar level is hard to control.  In last 24 hours, it has ranged from 64 up to 277.   -Currently on Semglee 12 units daily along with sliding scale insulin.  At this time, I would continue him  on the same.  Further adjustment can be made at the rehab. Recent Labs  Lab 08/28/21 2004 08/28/21 2315 08/29/21 0336 08/29/21 0826 08/29/21 1019  GLUCAP 71 176* 256* 230* 191*   Essential hypertension -Blood pressure running mostly above 140s in last 24 hours. -Currently on Lopressor 50 mg twice daily and amlodipine 10 mg daily.  Continue to monitor.   Sacral decubitus ulcer stage II - POA -wound care on board.  Constipation -10/13, CT abdomen showed large burden of stool and stool balls throughout the colon  Acute urinary retention -CT abdomen on admission showed distended urinary bladder with urinary retention, probably related to constipation.  Foley catheter was inserted.  Foley removed and voiding trial today.   Allergies as of 08/29/2021       Reactions   Cranberry Itching   Hm Lidocaine Patch [lidocaine] Dermatitis   Blisters skin    Melatonin Other (See Comments)   nightmares        Medication List     STOP taking these medications    chlorproMAZINE 25 MG tablet Commonly known as: THORAZINE   DULoxetine 60 MG capsule Commonly known as: CYMBALTA   glimepiride 1 MG tablet Commonly known as: AMARYL   hydrALAZINE 50 MG tablet Commonly known as: APRESOLINE   lactulose 10 GM/15ML  solution Commonly known as: CHRONULAC   Oxycodone HCl 10 MG Tabs   tadalafil 20 MG tablet Commonly known as: CIALIS   Trulicity 3 HW/8.0SU Sopn Generic drug: Dulaglutide   VITAMIN C PO       TAKE these medications    acetaminophen 500 MG tablet Commonly known as: TYLENOL Take 500 mg by mouth every 6 (six) hours as needed for mild pain, fever or headache.   albuterol 108 (90 Base) MCG/ACT inhaler Commonly known as: VENTOLIN HFA Inhale 1 puff into the lungs every 4 (four) hours as needed for wheezing or shortness of breath.   ALPRAZolam 0.25 MG tablet Commonly known as: XANAX Take 0.25 mg by mouth 2 (two) times daily as needed for anxiety.   amLODipine 10 MG tablet Commonly known as: NORVASC Take 1 tablet (10 mg total) by mouth daily.   atorvastatin 40 MG tablet Commonly known as: LIPITOR Take 1 tablet (40 mg total) by mouth daily.   bisacodyl 10 MG suppository Commonly known as: DULCOLAX Place 1 suppository (10 mg total) rectally daily as needed for moderate constipation.   ceFAZolin  IVPB Commonly known as: ANCEF Inject 2 g into the vein every 8 (eight) hours. Indication:  Osteomyelitis  First Dose: No Last Day of Therapy:  09/04/2022  Labs - Once weekly:  CBC/D and BMP, Labs - Every other week:  ESR and CRP Method of administration: IV Push Method of administration may be changed at the discretion of home infusion pharmacist based upon assessment of the patient and/or caregiver's ability to self-administer the medication ordered. What changed: Another medication with the same name was added. Make sure you understand how and when to take each.   ceFAZolin  IVPB Commonly known as: ANCEF Inject 2 g into the vein every 8 (eight) hours. Indication:  vertebral osteomyelitis First Dose: No Last Day of Therapy:  09/24/21 Labs - Once weekly:  CBC/D and BMP, Labs - Every other week:  ESR and CRP Method of administration: IV Push Method of administration may be  changed at the discretion of home infusion pharmacist based upon assessment of the patient and/or caregiver's ability to self-administer the medication ordered. What changed: You  were already taking a medication with the same name, and this prescription was added. Make sure you understand how and when to take each.   collagenase ointment Commonly known as: SANTYL Apply topically daily.   cyclobenzaprine 10 MG tablet Commonly known as: FLEXERIL Take 1 tablet (10 mg total) by mouth 3 (three) times daily as needed for muscle spasms. What changed:  when to take this reasons to take this   Darbepoetin Alfa 150 MCG/0.3ML Sosy injection Commonly known as: ARANESP Inject 0.3 mLs (150 mcg total) into the skin every Monday at 6 PM. Start taking on: August 31, 2021   feeding supplement (VITAL 1.5 CAL) Liqd Place 1,000 mLs into feeding tube daily. What changed:  how much to take how to take this when to take this   feeding supplement (PROSource TF) liquid Place 45 mLs into feeding tube 3 (three) times daily. What changed: You were already taking a medication with the same name, and this prescription was added. Make sure you understand how and when to take each.   gabapentin 100 MG capsule Commonly known as: NEURONTIN Take 2 capsules (200 mg total) by mouth 2 (two) times daily. What changed:  medication strength See the new instructions.   insulin aspart 100 UNIT/ML injection Commonly known as: novoLOG Inject 0-20 Units into the skin every 4 (four) hours.   insulin glargine-yfgn 100 UNIT/ML injection Commonly known as: SEMGLEE Inject 0.12 mLs (12 Units total) into the skin at bedtime.   Lucentis 0.3 MG/0.05ML Soln Generic drug: Ranibizumab 1 Dose by Intravitreal route every 3 (three) months.   metoprolol tartrate 50 MG tablet Commonly known as: LOPRESSOR Take 1 tablet (50 mg total) by mouth 2 (two) times daily.   MULTIVITAMIN ADULT PO Take 1 tablet by mouth daily.    mupirocin ointment 2 % Commonly known as: BACTROBAN Apply 1 application topically 2 (two) times daily.   oxyCODONE-acetaminophen 5-325 MG tablet Commonly known as: PERCOCET/ROXICET Take 1 tablet by mouth every 6 (six) hours as needed for moderate pain.   pantoprazole 40 MG tablet Commonly known as: PROTONIX Take 1 tablet (40 mg total) by mouth 2 (two) times daily.   polyethylene glycol 17 g packet Commonly known as: MIRALAX / GLYCOLAX Take 17 g by mouth daily as needed for moderate constipation, mild constipation or severe constipation.   senna-docusate 8.6-50 MG tablet Commonly known as: Senokot-S Take 1 tablet by mouth 2 (two) times daily.   sucralfate 1 GM/10ML suspension Commonly known as: CARAFATE Take 10 mLs (1 g total) by mouth 4 (four) times daily -  with meals and at bedtime.   tobramycin 0.3 % ophthalmic solution Commonly known as: TOBREX Place 1 drop into both eyes See admin instructions. Begin 1 day prior to treatment and continue the day of treatment and for one full day after treatment.   traZODone 100 MG tablet Commonly known as: DESYREL TAKE 1 TABLET(100 MG) BY MOUTH AT BEDTIME AS NEEDED FOR SLEEP What changed:  how much to take how to take this when to take this additional instructions   VITAMIN D PO Take 1 tablet by mouth daily.               Durable Medical Equipment  (From admission, onward)           Start     Ordered   08/25/21 0929  For home use only DME Air overlay mattress  Once        08/25/21 [phone]  Discharge Care Instructions  (From admission, onward)           Start     Ordered   08/29/21 0000  Change dressing on IV access line weekly and PRN  (Home infusion instructions - [facility] )        08/29/21 1053   08/29/21 0000  Discharge wound care:       Comments: Clean the buttock area with no rinse cleanser. Pat dry and apply a nickel thick layer of Santyl over the blackened area, cover  with a moistened saline gauze and secure with foam dressing. Carefully peel back foam dressing to apply santyl daily. May use sacral foam dressing up to 3 days unless soiled.  Apply Mupirocin ointment to the left great toe, cover with non-adherent gauze and wrap with Conform Mancel Parsons Kellie Simmering # [phone]) Apply twice daily   08/29/21 1054            Discharge Instructions:  Diet Recommendation:  Nightly tube feeding. Carb controlled diet the day   @BRDDSCINSTRUCTIONS @  Follow ups:    Follow-up Information     [name], NP Follow up.   Specialty: Family Medicine Contact information: [place] [address] [phone]                 Wound care:   Wound / Incision (Open or Dehisced) 07/22/21 Diabetic ulcer Toe (Comment  which one) Left Big toe ulcer (Active)  Date First Assessed/Time First Assessed: 07/22/21 (c)    Wound Type: Diabetic ulcer  Location: Toe (Comment  which one)  Location Orientation: Left  Wound Description (Comments): Big toe ulcer  Present on Admission: Yes    Assessments 07/26/2021 10:45 PM 08/25/2021  7:36 PM  Wound Image       Dressing Type Gauze (Comment);Other (Comment) --  Dressing Changed Changed --  Dressing Status Clean;Dry;Intact --  Dressing Change Frequency Daily --  Site / Wound Assessment Clean;Dry --  Peri-wound Assessment Intact Intact  Drainage Amount None --  Treatment Cleansed --     No Linked orders to display     Incision (Closed) 08/07/21 Back (Active)  Date First Assessed/Time First Assessed: 08/07/21 1803   Location: Back    Assessments 08/07/2021  6:15 PM 08/28/2021  8:05 PM  Dressing Type Liquid skin adhesive;Honeycomb --  Dressing Clean;Dry;Intact Clean;Dry;Intact  Site / Wound Assessment Clean;Dry --  Closure Skin glue --  Drainage Amount None --     No Linked orders to display     Pressure Injury 08/10/21 Coccyx Medial Stage 2 -  Partial thickness loss of dermis presenting as a shallow  open injury with a red, pink wound bed without slough. (Active)  Date First Assessed/Time First Assessed: 08/10/21 1700   Location: (c) Coccyx  Location Orientation: Medial  Staging: Stage 2 -  Partial thickness loss of dermis presenting as a shallow open injury with a red, pink wound bed without slough.  Present o...    Assessments 08/10/2021  5:00 PM 08/28/2021  8:05 PM  Dressing Type Foam - Lift dressing to assess site every shift --  Dressing Clean;Dry;Intact Clean;Dry;Intact  Dressing Change Frequency -- Twice a day  Site / Wound Assessment -- Dressing in place / Unable to assess  Peri-wound Assessment -- Intact  Wound Length (cm) 1 cm --  Wound Width (cm) 0.5 cm --  Wound Surface Area (cm^2) 0.5 cm^2 --     No Linked orders to display  Pressure Injury 08/14/21 Buttocks Bilateral Deep Tissue Pressure Injury - Purple or maroon localized area of discolored intact skin or blood-filled blister due to damage of underlying soft tissue from pressure and/or shear. DTI (Active)  Date First Assessed/Time First Assessed: 08/14/21 0800   Location: Buttocks  Location Orientation: Bilateral  Staging: Deep Tissue Pressure Injury - Purple or maroon localized area of discolored intact skin or blood-filled blister due to damage of und...    Assessments 08/14/2021  7:22 PM 08/28/2021  8:05 PM  Dressing Type Foam - Lift dressing to assess site every shift --  Dressing Clean;Dry;Intact Clean;Dry;Intact  Site / Wound Assessment -- Dressing in place / Unable to assess  Drainage Amount -- None     No Linked orders to display    Discharge Exam:   Vitals:   08/28/21 2317 08/29/21 0333 08/29/21 0500 08/29/21 0721  BP: 136/74 (!) 159/80  (!) 178/87  Pulse: 74 86  97  Resp: 17 18  20   Temp: 98.3 F (36.8 C) 98.1 F (36.7 C)  97.7 F (36.5 C)  TempSrc: Oral Oral  Oral  SpO2: 98% 95%  96%  Weight:   96.6 kg   Height:        Body mass index is 29.71 kg/m.  General exam: Pleasant, middle-aged  Caucasian male.  Looks older for his age. Skin: No rashes, lesions or ulcers. HEENT: Atraumatic, normocephalic, no obvious bleeding Lungs: left lung base with mild crackles. CVS: Regular rate and rhythm, no murmur GI/Abd soft, nontender, nondistended, bowel sound present CNS: Alert, awake, oriented x3 Psychiatry: Anxious look Extremities: No pedal edema, no calf tenderness  Time coordinating discharge: 35 minutes   The results of significant diagnostics from this hospitalization (including imaging, microbiology, ancillary and laboratory) are listed below for reference.    Procedures and Diagnostic Studies:   DG Thoracic Spine 2 View  Result Date: 08/07/2021 CLINICAL DATA:  T4-5 laminectomy/decompression with micro discectomy EXAM: THORACIC SPINE 2 VIEWS; DG C-ARM 1-60 MIN-NO REPORT COMPARISON:  CTA chest dated 08/06/2021 FLUOROSCOPY TIME:  19 seconds 9.8 mGy FINDINGS: Intraoperative fluoroscopic spot radiograph of the thoracic spine during T4-5 laminectomy/decompression. IMPRESSION: Intraoperative fluoroscopic images during T4-5 laminectomy/decompression. Electronically Signed   By: Julian Hy M.D.   On: 08/07/2021 20:22   CT Angio Chest PE W and/or Wo Contrast  Result Date: 08/06/2021 CLINICAL DATA:  Chest pain, shortness of breath, rule out PE EXAM: CT ANGIOGRAPHY CHEST WITH CONTRAST TECHNIQUE: Multidetector CT imaging of the chest was performed using the standard protocol during bolus administration of intravenous contrast. Multiplanar CT image reconstructions and MIPs were obtained to evaluate the vascular anatomy. CONTRAST:  34m OMNIPAQUE IOHEXOL 350 MG/ML SOLN COMPARISON:  CT chest, 07/22/2021 MR thoracic spine, 08/06/2021 FINDINGS: Cardiovascular: Satisfactory opacification of the pulmonary arteries to the segmental level. No evidence of pulmonary embolism. Cardiomegaly. Left coronary artery calcifications. Trace pericardial effusion. Right upper extremity PICC, tip position  near the superior cavoatrial junction. Mediastinum/Nodes: Unchanged enlarged right hilar and subcarinal lymph nodes, largest right hilar nodes measuring up to 2.3 x 2.1 cm (series 4, image 50). Thyroid gland, trachea, and esophagus demonstrate no significant findings. Lungs/Pleura: Moderate bilateral pleural effusions associated atelectasis or consolidation, new compared to prior examination. Upper Abdomen: No acute abnormality. Musculoskeletal: No chest wall abnormality. There is substantial interval worsening of destructive lytic change about the T4-T5 disc space (series 10, image 94). Overlying paravertebral soft tissue thickening (series 4, image 50). Review of the MIP images confirms the above findings. IMPRESSION:  1. Negative examination for pulmonary embolism. 2. Moderate bilateral pleural effusions and associated atelectasis or consolidation, new compared to prior examination. 3. There is substantial interval worsening of destructive lytic change about the T4-T5 disc space. Overlying paravertebral soft tissue thickening. Findings are consistent with worsened discitis osteomyelitis but in general better assessed by same-day MR. 4. Unchanged enlarged right hilar and subcarinal lymph nodes, likely reactive. 5. Cardiomegaly and coronary artery disease. Electronically Signed   By: Delanna Ahmadi M.D.   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 Findings in the report and this addendum discussed with Dr. Sherry Ruffing via telephone at 11:55 AM. Electronically Signed   By: Margaretha Sheffield M.D.   On: 08/06/2021 11:59   Result Date: 08/06/2021 CLINICAL DATA:  Mid-back pain Known osteomyelitis of the T-spine, recent biopsy completed EXAM: MRI THORACIC WITHOUT AND WITH CONTRAST TECHNIQUE: Multiplanar and multiecho pulse sequences of the  thoracic spine were obtained without and with intravenous contrast. CONTRAST:  44m GADAVIST GADOBUTROL 1 MMOL/ML IV SOLN COMPARISON:  MRI thoracic spine 07/23/2021. FINDINGS: Alignment: T5 bony retropulsion is detailed below. Otherwise, normal alignment. Vertebrae: Redemonstrated abnormal bone marrow edema and enhancement involving the entirety of the T4 and T5 vertebral bodies as well as the T5 posterior elements. Progressive T5 height loss (50-60%), compatible with pathologic fracture. New 4 mm of bony retropulsion along the superior T5 endplate. Cord:  No definite cord signal abnormality. Paraspinal and other soft tissues: There is increased paraspinal enhancement and edema surrounding the T4 and T5 levels. There is new enhancement in the posterior left paraspinal soft tissues with tiny (subcentimeter) fluid collections in this region (for example series 15, image 13). Disc levels: At T4-T5, there is new/increased epidural enhancement which measures up to 6 mm in thickness ventrally. Abnormal enhancement involves bilateral T4-T5 foramina. Additionally, there is new 4 mm of bony retropulsion along the superior T5 endplate. Resulting new moderate to severe left eccentric canal stenosis at T4 with cord deformity (series 11, image 15). IMPRESSION: 1. Redemonstrated abnormal bone marrow signal and enhancement involving the T4 and T5 levels with increased surrounding paraspinal phlegmon and new epidural phlegmon (6 mm thick ventrally). Interval T5 pathologic fracture (50-60% height loss) with new 4 mm of bony retropulsion. Constellation of findings results in new moderate to severe left eccentric canal stenosis at T4 with cord deformity. 2. Overall, findings are most likely secondary to progressive discitis/osteomyelitis. Superimposed osseous metastatic disease or multiple myeloma is not excluded by imaging. Recommend correlation with the results of reported recent biopsy. 3. Multiple small (subcentimeter) fluid  collections in the left posterior paraspinal phlegmon could represent tiny abscesses. Electronically Signed: By: FMargaretha SheffieldM.D. On: 08/06/2021 11:49   [place]raoperative  Result Date: 08/07/2021 CLINICAL DATA:  Ultrasound was provided for use by the ordering physician.  No provider Interpretation or professional fees incurred.    CT ABDOMEN PELVIS W CONTRAST  Result Date: 08/06/2021 CLINICAL DATA:  Abdominal distension, chills ongoing antibiotic therapy for osteomyelitis EXAM: CT ABDOMEN AND PELVIS WITH CONTRAST TECHNIQUE: Multidetector CT imaging of the abdomen and pelvis was performed using the standard protocol following bolus administration of intravenous contrast. CONTRAST:  828mOMNIPAQUE IOHEXOL 350 MG/ML SOLN, additional oral enteric contrast COMPARISON:  07/23/2021 FINDINGS: Lower chest: Please see separately reported examination of the chest. Hepatobiliary: No solid liver abnormality  is seen. No gallstones, gallbladder wall thickening, or biliary dilatation. Pancreas: Unremarkable. No pancreatic ductal dilatation or surrounding inflammatory changes. Spleen: Normal in size without significant abnormality. Adrenals/Urinary Tract: Adrenal glands are unremarkable. Kidneys are normal, without renal calculi, solid lesion, or hydronephrosis. Distended urinary bladder, measuring up to 19.0 cm. Stomach/Bowel: Stomach is within normal limits. Appendix is not clearly visualized. No evidence of bowel wall thickening, distention, or inflammatory changes. Generally large burden of stool and stool balls throughout the colon. Vascular/Lymphatic: Aortic atherosclerosis. No enlarged abdominal or pelvic lymph nodes. Reproductive: No mass or other significant abnormality. Other: Anasarca.  No abdominopelvic ascites. Musculoskeletal: No acute or significant osseous findings. IMPRESSION: 1. Generally large burden of stool and stool balls throughout the colon. 2. Distended urinary bladder, measuring up to 19.0  cm. Correlate for urinary retention. 3. Anasarca. Aortic Atherosclerosis (ICD10-I70.0). Electronically Signed   By: Delanna Ahmadi M.D.   On: 08/06/2021 13:37   DG Chest Portable 1 View  Result Date: 08/06/2021 CLINICAL DATA:  Chest pain EXAM: PORTABLE CHEST 1 VIEW COMPARISON:  Ten days ago FINDINGS: Haziness in the bilateral lower chest with indistinct/streaky density, also seen previously. Normal range heart size for low volume portable technique. Right PICC with tip at the upper right atrium. No edema, effusion, or pneumothorax. IMPRESSION: Stable low volume chest with atelectasis or infiltrates at the bases. Electronically Signed   By: Jorje Guild M.D.   On: 08/06/2021 10:39   DG C-Arm 1-60 Min-No Report  Result Date: 08/07/2021 CLINICAL DATA:  T4-5 laminectomy/decompression with micro discectomy EXAM: THORACIC SPINE 2 VIEWS; DG C-ARM 1-60 MIN-NO REPORT COMPARISON:  CTA chest dated 08/06/2021 FLUOROSCOPY TIME:  19 seconds 9.8 mGy FINDINGS: Intraoperative fluoroscopic spot radiograph of the thoracic spine during T4-5 laminectomy/decompression. IMPRESSION: Intraoperative fluoroscopic images during T4-5 laminectomy/decompression. Electronically Signed   By: Julian Hy M.D.   On: 08/07/2021 20:22     Labs:   Basic Metabolic Panel: Recent Labs  Lab 08/23/21 0450 08/24/21 0435 08/25/21 0525 08/26/21 0345 08/27/21 0320 08/28/21 0346 08/29/21 0353  NA 149* 144 143 142 144 141 138  K 4.3 4.2 4.3 4.3 4.7 4.3 4.2  CL 120* 115* 116* 115* 116* 114* 112*  CO2 22 21* 20* 19* 20* 22 21*  GLUCOSE 78 112* 128* 207* 173* 327* 277*  BUN 81* 70* 60* 52* 49* 47* 48*  CREATININE 2.21* 2.18* 2.02* 1.83* 1.64* 1.52* 1.56*  CALCIUM 8.2* 8.1* 8.3* 8.3* 8.4* 8.4* 8.2*  MG  --   --   --   --  2.1  --   --   PHOS 5.7* 5.5* 3.9 3.5 3.3  --   --    GFR Estimated Creatinine Clearance: 69.4 mL/min (A) (by C-G formula based on SCr of 1.56 mg/dL (H)). Liver Function Tests: Recent Labs  Lab  08/23/21 0450 08/24/21 0435 08/25/21 0525 08/26/21 0345  ALBUMIN 1.8* 1.8* 1.7* 1.8*   No results for input(s): LIPASE, AMYLASE in the last 168 hours. No results for input(s): AMMONIA in the last 168 hours. Coagulation profile No results for input(s): INR, PROTIME in the last 168 hours.  CBC: Recent Labs  Lab 08/25/21 0525 08/26/21 0345 08/27/21 0320 08/28/21 0346 08/29/21 0353  WBC 10.8* 9.7 10.9* 8.4 8.9  NEUTROABS 7.8*  --  7.7 5.6 5.5  HGB 8.5* 8.7* 8.9* 8.8* 8.5*  HCT 27.1* 28.4* 29.0* 29.4* 28.8*  MCV 92.8 92.5 93.5 94.8 96.0  PLT 315 325 342 323 318   Cardiac Enzymes: No results for  input(s): CKTOTAL, CKMB, CKMBINDEX, TROPONINI in the last 168 hours. BNP: Invalid input(s): POCBNP CBG: Recent Labs  Lab 08/28/21 2004 08/28/21 2315 08/29/21 0336 08/29/21 0826 08/29/21 1019  GLUCAP 71 176* 256* 230* 191*   D-Dimer No results for input(s): DDIMER in the last 72 hours. Hgb A1c No results for input(s): HGBA1C in the last 72 hours. Lipid Profile No results for input(s): CHOL, HDL, LDLCALC, TRIG, CHOLHDL, LDLDIRECT in the last 72 hours. Thyroid function studies No results for input(s): TSH, T4TOTAL, T3FREE, THYROIDAB in the last 72 hours.  Invalid input(s): FREET3 Anemia work up No results for input(s): VITAMINB12, FOLATE, FERRITIN, TIBC, IRON, RETICCTPCT in the last 72 hours. Microbiology No results found for this or any previous visit (from the past 240 hour(s)).   Signed: Terrilee Croak  [facility] 08/29/2021, 10:54 AM

## 2021-08-29 NOTE — Progress Notes (Signed)
Patient has foley placed at 1517 due to urinary rentention. Patient has not voided since this RN has pulled his foley catheter yesterday and after 2 in and out caths over night shift and many trials getting up to use the urinal patient has asked/agreed to have foley catheter placed instead of a 3rd in and out catheter trial. This RN assessed bladder for distention and could feel that the patient was positive for a full bladder. Patient had 750 Ccs released after foley was placed and color of urine was a tea color. Smell was unremarkable. MD notified. WCTM.   Shelbie Proctor, RN

## 2021-08-30 ENCOUNTER — Inpatient Hospital Stay (HOSPITAL_COMMUNITY)
Admission: RE | Admit: 2021-08-30 | Discharge: 2021-09-21 | DRG: 945 | Disposition: A | Discharge status: Home Health | Payer: 59 | Source: Intra-hospital | Attending: Physical Medicine and Rehabilitation | Admitting: Physical Medicine and Rehabilitation

## 2021-08-30 ENCOUNTER — Other Ambulatory Visit: Payer: Self-pay

## 2021-08-30 ENCOUNTER — Encounter (HOSPITAL_COMMUNITY): Payer: Self-pay | Admitting: Physical Medicine and Rehabilitation

## 2021-08-30 DIAGNOSIS — K5900 Constipation, unspecified: Secondary | ICD-10-CM

## 2021-08-30 DIAGNOSIS — N183 Chronic kidney disease, stage 3 unspecified: Secondary | ICD-10-CM | POA: Diagnosis present

## 2021-08-30 DIAGNOSIS — E1140 Type 2 diabetes mellitus with diabetic neuropathy, unspecified: Secondary | ICD-10-CM | POA: Diagnosis present

## 2021-08-30 DIAGNOSIS — N189 Chronic kidney disease, unspecified: Secondary | ICD-10-CM

## 2021-08-30 DIAGNOSIS — R609 Edema, unspecified: Secondary | ICD-10-CM | POA: Diagnosis not present

## 2021-08-30 DIAGNOSIS — I959 Hypotension, unspecified: Secondary | ICD-10-CM | POA: Diagnosis not present

## 2021-08-30 DIAGNOSIS — Z8616 Personal history of COVID-19: Secondary | ICD-10-CM

## 2021-08-30 DIAGNOSIS — Z87891 Personal history of nicotine dependence: Secondary | ICD-10-CM

## 2021-08-30 DIAGNOSIS — M4644 Discitis, unspecified, thoracic region: Secondary | ICD-10-CM | POA: Diagnosis not present

## 2021-08-30 DIAGNOSIS — Z09 Encounter for follow-up examination after completed treatment for conditions other than malignant neoplasm: Secondary | ICD-10-CM

## 2021-08-30 DIAGNOSIS — Z7985 Long-term (current) use of injectable non-insulin antidiabetic drugs: Secondary | ICD-10-CM

## 2021-08-30 DIAGNOSIS — M4624 Osteomyelitis of vertebra, thoracic region: Secondary | ICD-10-CM | POA: Diagnosis present

## 2021-08-30 DIAGNOSIS — F909 Attention-deficit hyperactivity disorder, unspecified type: Secondary | ICD-10-CM | POA: Diagnosis present

## 2021-08-30 DIAGNOSIS — N179 Acute kidney failure, unspecified: Secondary | ICD-10-CM | POA: Diagnosis present

## 2021-08-30 DIAGNOSIS — I129 Hypertensive chronic kidney disease with stage 1 through stage 4 chronic kidney disease, or unspecified chronic kidney disease: Secondary | ICD-10-CM | POA: Diagnosis present

## 2021-08-30 DIAGNOSIS — B9561 Methicillin susceptible Staphylococcus aureus infection as the cause of diseases classified elsewhere: Secondary | ICD-10-CM | POA: Diagnosis present

## 2021-08-30 DIAGNOSIS — M4714 Other spondylosis with myelopathy, thoracic region: Secondary | ICD-10-CM

## 2021-08-30 DIAGNOSIS — M8628 Subacute osteomyelitis, other site: Secondary | ICD-10-CM | POA: Diagnosis not present

## 2021-08-30 DIAGNOSIS — L0591 Pilonidal cyst without abscess: Secondary | ICD-10-CM | POA: Diagnosis present

## 2021-08-30 DIAGNOSIS — K922 Gastrointestinal hemorrhage, unspecified: Secondary | ICD-10-CM

## 2021-08-30 DIAGNOSIS — R159 Full incontinence of feces: Secondary | ICD-10-CM | POA: Diagnosis not present

## 2021-08-30 DIAGNOSIS — M8448XD Pathological fracture, other site, subsequent encounter for fracture with routine healing: Secondary | ICD-10-CM | POA: Diagnosis present

## 2021-08-30 DIAGNOSIS — E1169 Type 2 diabetes mellitus with other specified complication: Secondary | ICD-10-CM | POA: Diagnosis present

## 2021-08-30 DIAGNOSIS — E11319 Type 2 diabetes mellitus with unspecified diabetic retinopathy without macular edema: Secondary | ICD-10-CM | POA: Diagnosis present

## 2021-08-30 DIAGNOSIS — G9589 Other specified diseases of spinal cord: Secondary | ICD-10-CM | POA: Diagnosis present

## 2021-08-30 DIAGNOSIS — E1142 Type 2 diabetes mellitus with diabetic polyneuropathy: Secondary | ICD-10-CM | POA: Diagnosis present

## 2021-08-30 DIAGNOSIS — B3781 Candidal esophagitis: Secondary | ICD-10-CM | POA: Diagnosis present

## 2021-08-30 DIAGNOSIS — K269 Duodenal ulcer, unspecified as acute or chronic, without hemorrhage or perforation: Secondary | ICD-10-CM | POA: Diagnosis present

## 2021-08-30 DIAGNOSIS — Z23 Encounter for immunization: Secondary | ICD-10-CM

## 2021-08-30 DIAGNOSIS — N319 Neuromuscular dysfunction of bladder, unspecified: Secondary | ICD-10-CM | POA: Diagnosis not present

## 2021-08-30 DIAGNOSIS — L89150 Pressure ulcer of sacral region, unstageable: Secondary | ICD-10-CM | POA: Diagnosis present

## 2021-08-30 DIAGNOSIS — Z792 Long term (current) use of antibiotics: Secondary | ICD-10-CM

## 2021-08-30 DIAGNOSIS — L8992 Pressure ulcer of unspecified site, stage 2: Secondary | ICD-10-CM | POA: Diagnosis present

## 2021-08-30 DIAGNOSIS — R5381 Other malaise: Secondary | ICD-10-CM | POA: Diagnosis present

## 2021-08-30 DIAGNOSIS — Z91018 Allergy to other foods: Secondary | ICD-10-CM

## 2021-08-30 DIAGNOSIS — Z794 Long term (current) use of insulin: Secondary | ICD-10-CM

## 2021-08-30 DIAGNOSIS — Z79899 Other long term (current) drug therapy: Secondary | ICD-10-CM

## 2021-08-30 DIAGNOSIS — Z888 Allergy status to other drugs, medicaments and biological substances status: Secondary | ICD-10-CM

## 2021-08-30 DIAGNOSIS — E11649 Type 2 diabetes mellitus with hypoglycemia without coma: Secondary | ICD-10-CM | POA: Diagnosis not present

## 2021-08-30 DIAGNOSIS — K209 Esophagitis, unspecified without bleeding: Secondary | ICD-10-CM

## 2021-08-30 DIAGNOSIS — R7309 Other abnormal glucose: Secondary | ICD-10-CM

## 2021-08-30 DIAGNOSIS — R7881 Bacteremia: Secondary | ICD-10-CM | POA: Diagnosis present

## 2021-08-30 DIAGNOSIS — Z9981 Dependence on supplemental oxygen: Secondary | ICD-10-CM

## 2021-08-30 DIAGNOSIS — D62 Acute posthemorrhagic anemia: Secondary | ICD-10-CM | POA: Diagnosis present

## 2021-08-30 DIAGNOSIS — E1165 Type 2 diabetes mellitus with hyperglycemia: Secondary | ICD-10-CM | POA: Diagnosis present

## 2021-08-30 DIAGNOSIS — Z818 Family history of other mental and behavioral disorders: Secondary | ICD-10-CM

## 2021-08-30 DIAGNOSIS — Z7984 Long term (current) use of oral hypoglycemic drugs: Secondary | ICD-10-CM

## 2021-08-30 DIAGNOSIS — H3530 Unspecified macular degeneration: Secondary | ICD-10-CM | POA: Diagnosis present

## 2021-08-30 DIAGNOSIS — J90 Pleural effusion, not elsewhere classified: Secondary | ICD-10-CM | POA: Diagnosis not present

## 2021-08-30 DIAGNOSIS — E871 Hypo-osmolality and hyponatremia: Secondary | ICD-10-CM | POA: Diagnosis present

## 2021-08-30 DIAGNOSIS — J9601 Acute respiratory failure with hypoxia: Secondary | ICD-10-CM | POA: Diagnosis not present

## 2021-08-30 DIAGNOSIS — M4628 Osteomyelitis of vertebra, sacral and sacrococcygeal region: Secondary | ICD-10-CM | POA: Diagnosis present

## 2021-08-30 DIAGNOSIS — E785 Hyperlipidemia, unspecified: Secondary | ICD-10-CM | POA: Diagnosis present

## 2021-08-30 DIAGNOSIS — F411 Generalized anxiety disorder: Secondary | ICD-10-CM | POA: Diagnosis present

## 2021-08-30 DIAGNOSIS — J189 Pneumonia, unspecified organism: Secondary | ICD-10-CM

## 2021-08-30 DIAGNOSIS — Z79891 Long term (current) use of opiate analgesic: Secondary | ICD-10-CM

## 2021-08-30 DIAGNOSIS — J9621 Acute and chronic respiratory failure with hypoxia: Secondary | ICD-10-CM | POA: Diagnosis not present

## 2021-08-30 DIAGNOSIS — Z833 Family history of diabetes mellitus: Secondary | ICD-10-CM

## 2021-08-30 DIAGNOSIS — L89326 Pressure-induced deep tissue damage of left buttock: Secondary | ICD-10-CM | POA: Diagnosis present

## 2021-08-30 DIAGNOSIS — R339 Retention of urine, unspecified: Secondary | ICD-10-CM | POA: Diagnosis not present

## 2021-08-30 DIAGNOSIS — L89316 Pressure-induced deep tissue damage of right buttock: Secondary | ICD-10-CM | POA: Diagnosis present

## 2021-08-30 HISTORY — DX: Acute kidney failure, unspecified: N17.9

## 2021-08-30 LAB — GLUCOSE, CAPILLARY
Glucose-Capillary: 103 mg/dL — ABNORMAL HIGH (ref 70–99)
Glucose-Capillary: 104 mg/dL — ABNORMAL HIGH (ref 70–99)
Glucose-Capillary: 136 mg/dL — ABNORMAL HIGH (ref 70–99)
Glucose-Capillary: 181 mg/dL — ABNORMAL HIGH (ref 70–99)
Glucose-Capillary: 182 mg/dL — ABNORMAL HIGH (ref 70–99)
Glucose-Capillary: 81 mg/dL (ref 70–99)

## 2021-08-30 MED ORDER — FLEET ENEMA 7-19 GM/118ML RE ENEM: 1.0000 | ENEMA | Freq: Once | RECTAL | Status: DC | PRN

## 2021-08-30 MED ORDER — HYDROXYZINE HCL 10 MG PO TABS
10.0000 mg | ORAL_TABLET | Freq: Three times a day (TID) | ORAL | Status: DC | PRN
  Administered 2021-08-30 – 2021-09-01 (×4): 10 mg via ORAL
  Filled 2021-08-30 (×6): qty 1

## 2021-08-30 MED ORDER — SUCRALFATE 1 GM/10ML PO SUSP
1.0000 g | Freq: Three times a day (TID) | ORAL | Status: DC
  Administered 2021-08-30: 1 g via ORAL
  Filled 2021-08-30 (×3): qty 10

## 2021-08-30 MED ORDER — PROSOURCE TF PO LIQD
45.0000 mL | Freq: Three times a day (TID) | ORAL | Status: DC
  Administered 2021-08-30 – 2021-08-31 (×2): 45 mL
  Filled 2021-08-30 (×2): qty 45

## 2021-08-30 MED ORDER — COLLAGENASE 250 UNIT/GM EX OINT
1.0000 "application " | TOPICAL_OINTMENT | Freq: Every day | CUTANEOUS | Status: DC
  Administered 2021-08-30 – 2021-09-07 (×9): 1 via TOPICAL
  Filled 2021-08-30: qty 30

## 2021-08-30 MED ORDER — DARBEPOETIN ALFA 150 MCG/0.3ML IJ SOSY
150.0000 ug | PREFILLED_SYRINGE | INTRAMUSCULAR | Status: DC
  Administered 2021-09-07 – 2021-09-14 (×2): 150 ug via SUBCUTANEOUS
  Filled 2021-08-30 (×4): qty 0.3

## 2021-08-30 MED ORDER — PROCHLORPERAZINE EDISYLATE 10 MG/2ML IJ SOLN: 5.0000 mg | Freq: Four times a day (QID) | INTRAMUSCULAR | Status: DC | PRN

## 2021-08-30 MED ORDER — CEFAZOLIN SODIUM-DEXTROSE 2-4 GM/100ML-% IV SOLN
2.0000 g | Freq: Three times a day (TID) | INTRAVENOUS | Status: DC
  Administered 2021-08-30 – 2021-09-21 (×66): 2 g via INTRAVENOUS
  Filled 2021-08-30 (×69): qty 100

## 2021-08-30 MED ORDER — INSULIN ASPART 100 UNIT/ML IJ SOLN
0.0000 [IU] | INTRAMUSCULAR | Status: DC
  Administered 2021-08-30: 3 [IU] via SUBCUTANEOUS
  Administered 2021-08-31: 4 [IU] via SUBCUTANEOUS

## 2021-08-30 MED ORDER — TRAZODONE HCL 50 MG PO TABS: 25.0000 mg | ORAL_TABLET | Freq: Every evening | ORAL | Status: DC | PRN

## 2021-08-30 MED ORDER — LIP MEDEX EX OINT
1.0000 "application " | TOPICAL_OINTMENT | CUTANEOUS | Status: DC | PRN
  Filled 2021-08-30: qty 7

## 2021-08-30 MED ORDER — ALBUTEROL SULFATE HFA 108 (90 BASE) MCG/ACT IN AERS
1.0000 | INHALATION_SPRAY | RESPIRATORY_TRACT | Status: DC | PRN
  Filled 2021-08-30: qty 6.7

## 2021-08-30 MED ORDER — TAMSULOSIN HCL 0.4 MG PO CAPS: 0.4000 mg | ORAL_CAPSULE | Freq: Every day | ORAL | Status: DC

## 2021-08-30 MED ORDER — POLYETHYLENE GLYCOL 3350 17 G PO PACK
17.0000 g | PACK | Freq: Every day | ORAL | Status: DC | PRN
  Administered 2021-09-01 – 2021-09-06 (×3): 17 g via ORAL
  Filled 2021-08-30 (×3): qty 1

## 2021-08-30 MED ORDER — OXYCODONE-ACETAMINOPHEN 5-325 MG PO TABS
1.0000 | ORAL_TABLET | Freq: Four times a day (QID) | ORAL | Status: DC | PRN
  Administered 2021-08-31 – 2021-09-01 (×4): 1 via ORAL
  Filled 2021-08-30 (×5): qty 1

## 2021-08-30 MED ORDER — SODIUM CHLORIDE 0.9% FLUSH
10.0000 mL | Freq: Two times a day (BID) | INTRAVENOUS | Status: DC
  Administered 2021-08-31 – 2021-09-14 (×25): 10 mL
  Administered 2021-09-16: 20 mL
  Administered 2021-09-17 – 2021-09-18 (×3): 10 mL

## 2021-08-30 MED ORDER — ORAL CARE MOUTH RINSE
15.0000 mL | Freq: Two times a day (BID) | OROMUCOSAL | Status: DC
  Administered 2021-08-31 – 2021-09-21 (×29): 15 mL via OROMUCOSAL

## 2021-08-30 MED ORDER — BOOST / RESOURCE BREEZE PO LIQD CUSTOM
1.0000 | Freq: Three times a day (TID) | ORAL | Status: DC
  Administered 2021-08-31: 1 via ORAL

## 2021-08-30 MED ORDER — AMLODIPINE BESYLATE 10 MG PO TABS
10.0000 mg | ORAL_TABLET | Freq: Every day | ORAL | Status: DC
  Administered 2021-08-31 – 2021-09-20 (×21): 10 mg via ORAL
  Filled 2021-08-30 (×21): qty 1

## 2021-08-30 MED ORDER — METOPROLOL TARTRATE 50 MG PO TABS
50.0000 mg | ORAL_TABLET | Freq: Two times a day (BID) | ORAL | Status: DC
  Administered 2021-08-30 – 2021-09-21 (×44): 50 mg via ORAL
  Filled 2021-08-30 (×44): qty 1

## 2021-08-30 MED ORDER — DIPHENHYDRAMINE HCL 12.5 MG/5ML PO ELIX: 12.5000 mg | ORAL_SOLUTION | Freq: Four times a day (QID) | ORAL | Status: DC | PRN

## 2021-08-30 MED ORDER — ACETAMINOPHEN 325 MG PO TABS
325.0000 mg | ORAL_TABLET | ORAL | Status: DC | PRN
  Administered 2021-08-31 – 2021-09-01 (×2): 650 mg via ORAL
  Filled 2021-08-30 (×3): qty 2

## 2021-08-30 MED ORDER — PANTOPRAZOLE SODIUM 40 MG PO TBEC
40.0000 mg | DELAYED_RELEASE_TABLET | Freq: Two times a day (BID) | ORAL | Status: DC
  Administered 2021-08-30 – 2021-09-21 (×44): 40 mg via ORAL
  Filled 2021-08-30 (×44): qty 1

## 2021-08-30 MED ORDER — MUPIROCIN 2 % EX OINT
1.0000 "application " | TOPICAL_OINTMENT | Freq: Two times a day (BID) | CUTANEOUS | Status: DC
  Administered 2021-08-31 – 2021-09-21 (×43): 1 via TOPICAL
  Filled 2021-08-30: qty 22

## 2021-08-30 MED ORDER — PROCHLORPERAZINE 25 MG RE SUPP: 12.5000 mg | Freq: Four times a day (QID) | RECTAL | Status: DC | PRN

## 2021-08-30 MED ORDER — INSULIN GLARGINE-YFGN 100 UNIT/ML ~~LOC~~ SOLN
12.0000 [IU] | Freq: Every day | SUBCUTANEOUS | Status: DC
  Administered 2021-08-30 – 2021-09-04 (×5): 12 [IU] via SUBCUTANEOUS
  Filled 2021-08-30 (×8): qty 0.12

## 2021-08-30 MED ORDER — BISACODYL 10 MG RE SUPP: 10.0000 mg | Freq: Every day | RECTAL | Status: DC | PRN

## 2021-08-30 MED ORDER — GABAPENTIN 100 MG PO CAPS
200.0000 mg | ORAL_CAPSULE | Freq: Two times a day (BID) | ORAL | Status: DC
  Administered 2021-08-30 – 2021-09-21 (×44): 200 mg via ORAL
  Filled 2021-08-30 (×44): qty 2

## 2021-08-30 MED ORDER — SUCRALFATE 1 G PO TABS
1.0000 g | ORAL_TABLET | Freq: Three times a day (TID) | ORAL | Status: DC
  Administered 2021-08-30 – 2021-09-21 (×84): 1 g via ORAL
  Filled 2021-08-30 (×89): qty 1

## 2021-08-30 MED ORDER — SENNOSIDES-DOCUSATE SODIUM 8.6-50 MG PO TABS
2.0000 | ORAL_TABLET | Freq: Every evening | ORAL | Status: DC | PRN
  Filled 2021-08-30: qty 2

## 2021-08-30 MED ORDER — ALUM & MAG HYDROXIDE-SIMETH 200-200-20 MG/5ML PO SUSP: 30.0000 mL | ORAL | Status: DC | PRN

## 2021-08-30 MED ORDER — PROCHLORPERAZINE MALEATE 5 MG PO TABS
5.0000 mg | ORAL_TABLET | Freq: Four times a day (QID) | ORAL | Status: DC | PRN
  Filled 2021-08-30: qty 1

## 2021-08-30 MED ORDER — CHLORHEXIDINE GLUCONATE CLOTH 2 % EX PADS
6.0000 | MEDICATED_PAD | Freq: Every day | CUTANEOUS | Status: DC
  Administered 2021-08-30 – 2021-09-01 (×3): 6 via TOPICAL

## 2021-08-30 MED ORDER — SODIUM CHLORIDE 0.9% FLUSH
10.0000 mL | INTRAVENOUS | Status: DC | PRN
  Administered 2021-09-01: 10 mL
  Administered 2021-09-12: 20 mL
  Administered 2021-09-13: 30 mL
  Administered 2021-09-15 – 2021-09-16 (×2): 10 mL

## 2021-08-30 MED ORDER — VITAL 1.5 CAL PO LIQD
1000.0000 mL | ORAL | Status: DC
  Filled 2021-08-30 (×2): qty 1000

## 2021-08-30 MED ORDER — GUAIFENESIN-DM 100-10 MG/5ML PO SYRP: 5.0000 mL | ORAL_SOLUTION | Freq: Four times a day (QID) | ORAL | Status: DC | PRN

## 2021-08-30 NOTE — Evaluation (Signed)
Occupational Therapy Assessment and Plan  Patient Details  Name: Jose [name] MRN: [id number] Date of Birth: [birth year]  OT Diagnosis: abnormal posture, acute pain, lumbago (low back pain), muscle weakness (generalized), and swelling of limb Rehab Potential: Rehab Potential (ACUTE ONLY): Good ELOS: 21-23 days   Today's Date: 08/31/2021 OT Individual Time: 0865-7846 OT Individual Time Calculation (min): 61 min     Hospital Problem: Active Problems:   Debility   Past Medical History:  Past Medical History:  Diagnosis Date   GAD (generalized anxiety disorder)    Hyperlipidemia    Macular degeneration, bilateral    Retinopathy    Type II diabetes mellitus with complication, uncontrolled    retinopathy, neuropathy, microalbuminuria   Past Surgical History:  Past Surgical History:  Procedure Laterality Date   APPENDECTOMY     BIOPSY  08/10/2021   Procedure: BIOPSY;  Surgeon: Otis Brace, MD;  Location: [place];  Service: Gastroenterology;;   BUBBLE STUDY  07/29/2021   Procedure: BUBBLE STUDY;  Surgeon: Jerline Pain, MD;  Location: [place] [facility];  Service: Cardiovascular;;   ESOPHAGOGASTRODUODENOSCOPY (EGD) WITH PROPOFOL N/A 08/10/2021   Procedure: ESOPHAGOGASTRODUODENOSCOPY (EGD) WITH PROPOFOL;  Surgeon: Otis Brace, MD;  Location: [place];  Service: Gastroenterology;  Laterality: N/A;   ESOPHAGOGASTRODUODENOSCOPY (EGD) WITH PROPOFOL N/A 08/24/2021   Procedure: ESOPHAGOGASTRODUODENOSCOPY (EGD) WITH PROPOFOL;  Surgeon: Otis Brace, MD;  Location: [place];  Service: Gastroenterology;  Laterality: N/A;   HERNIA REPAIR     IR FLUORO GUIDED NEEDLE PLC ASPIRATION/INJECTION LOC  07/28/2021   LUMBAR LAMINECTOMY/DECOMPRESSION MICRODISCECTOMY N/A 08/07/2021   Procedure: THORACIC FOUR - THORACIC FIVE LAMINECTOMY/DECOMPRESSION OF SPINAL CORD, DEBRIDEMENT OF ABSCESS, MICRODISCECTOMY, INTRAOPERATIVE ULTRASOUND;  Surgeon: Consuella Lose, MD;  Location: [place];  Service: Neurosurgery;  Laterality: N/A;   TEE WITHOUT CARDIOVERSION N/A 07/29/2021   Procedure: TRANSESOPHAGEAL ECHOCARDIOGRAM (TEE);  Surgeon: Jerline Pain, MD;  Location: [facility];  Service: Cardiovascular;  Laterality: N/A;   TRIGGER FINGER RELEASE Right 10/25/2019   Procedure: RIGHT INDEX FINGER RELEASE TRIGGER FINGER/A-1 PULLEY;  Surgeon: Daryll Brod, MD;  Location: [place];  Service: Orthopedics;  Laterality: Right;  IV REGIONAL FOREARM BLOCK    Assessment & Plan Clinical Impression: Jose [name] is a 45 year old male with history of T2DM with neuropathy and retinopathy, CKD, Macular degeneration, GAD with 6 week history of chest pain radiating to bilateral shoulders who admitted 9/28- 10/10 for work up. He was found to have lytic lesions T5,T6 and sternum with pathologic fracture T6 as well as MSSA bacteremia and was discharged to home on IV antibiotics. He was readmitted on 10/14 with worsening of pain and found to have increased in paraspinal phelegmon with new epidural phelgmon and interval T5 pathologic fracture with severe left eccentric canal stenosis and overall finding likely secondary to progressive diskitis/osteomyelitis.   He was taken to OR for T4 and T5 laminectomy with left T5 pediculotomy for decompression of spinal cord by Dr. Kathyrn Sheriff. Work up for MM and bone biopsy negative for malignancy.   Hospital course significant for abdominal pain with distension with coffee ground emesis 10/16 followed by drop in Hgb 6.2 with hypoxia and confusion. He was found to have multifocal PNA due to aspiration and PCCM consulted. Patient required BIPAP as well as 2 units PRBC.  He was evaluated by GI and underwent UGI by Dr. Alessandra Bevels on 10/17 revealing severe esophagitis with black discoloration of mucosa, large amount of retained coffee ground fluid in stomach which was suctioned  as well as few non bleeding superficial duodenal ulcers without stigmata of  bleeding.  He was started on IV protonix and transitioned to po BID with recommendations to continue for 2 months followed by daily dose.     CT head done negative for acute changes. He was started on fluconazole for candida esophagitis and CT chest showed possible enlargening effusions and abscesses. He was treated with stress dose steroids for ARDS and underwent thoracentesis of 250 cc of yellow clear fluid from left pleural space on 10/21 and tolerated extubation by 10/24. Nephrology following for acute on chronic renal failure with supportive care. He continued to have issues with confusion as well as recurrent respiratory distress with drop in Hgb to 5.8 on 10/26 and was treated with 2 units PRBC and monitored with serial H/H. He had large melanotic stools on 10/27 with small amount of bloody emesis. He was treated with protonix drip and supportive care.    V/Q scan done 10/27 showing large perfusion defect in LUL not typical of PE but unable to exclude PE and CT chest not done due to renal issues. BLE dopplers negative for DVT.  He developed recurrent tarry stools and underwent repeat EGD showing improvement in esophagitis with bilious fluid in stomach and no gastric or duodenal ulcers-->no further GI work up planned and GI signed off.  Diet resumed but he continues on tube feeds due to poor po intake with episodic hypoglycemia. CBC showed H/H to be stable, bloody stools have resolved and SCr is steadily improving. He has completed course of diflucan and transitioned to ancef with end date of 12/01. He continues to be limited by back pain and weakness affecting ADLs and mobility. CIR recommended due to functional decline.    Patient currently requires max with basic self-care skills secondary to muscle weakness and pain, decreased cardiorespiratoy endurance and decreased oxygen support, and decreased sitting balance.  Prior to hospitalization, patient could complete BADLs with modified independent  -independent.  Patient will benefit from skilled intervention to increase independence with basic self-care skills prior to discharge home with care partner.  Anticipate patient will require 24 hour supervision and follow up home health.  OT - End of Session Endurance Deficit: Yes Endurance Deficit Description: pain > endurance deficit but both contribute to ability to participate in therapy OT Assessment Rehab Potential (ACUTE ONLY): Good OT Barriers to Discharge: Weight;New oxygen OT Barriers to Discharge Comments: pain OT Patient demonstrates impairments in the following area(s): Balance;Skin Integrity;Edema;Endurance;Motor;Pain;Safety;Sensory OT Basic ADL's Functional Problem(s): Grooming;Bathing;Dressing;Toileting;Eating OT Advanced ADL's Functional Problem(s): Simple Meal Preparation OT Transfers Functional Problem(s): Toilet;Tub/Shower OT Additional Impairment(s): Other (comment) (not thoroughly assessed) OT Plan OT Intensity: Minimum of 1-2 x/day, 45 to 90 minutes OT Frequency: 5 out of 7 days OT Duration/Estimated Length of Stay: 21-23 days OT Treatment/Interventions: Balance/vestibular training;Disease mangement/prevention;Self Care/advanced ADL retraining;Therapeutic Exercise;Wheelchair propulsion/positioning;UE/LE Strength taining/ROM;Skin care/wound managment;Pain management;DME/adaptive equipment instruction;Community reintegration;Patient/family education;UE/LE Coordination activities;Therapeutic Activities;Psychosocial support;Functional mobility training;Discharge planning OT Self Feeding Anticipated Outcome(s): Setup OT Basic Self-Care Anticipated Outcome(s): Setup-supervision OT Toileting Anticipated Outcome(s): Supervision OT Bathroom Transfers Anticipated Outcome(s): Supervision OT Recommendation Recommendations for Other Services: Neuropsych consult Patient destination: Home Follow Up Recommendations: 24 hour supervision/assistance;Home health OT Equipment  Recommended: To be determined   OT Evaluation Pain Pain Assessment Pain Scale: 0-10 Pain Score: 6  Pain Type: Acute pain;Surgical pain Pain Location: Scapula Pain Orientation: Right Pain Descriptors / Indicators: Aching Pain Intervention(s): RN made aware;Repositioned;Ambulation/increased activity Home Living/Prior Functioning Home Living Family/patient expects to be discharged  to:: Private residence Living Arrangements: Spouse/significant other, Children Available Help at Discharge: Family, Available 24 hours/day Type of Home: House Home Access: Stairs to enter [employer] of Steps: 3 steps without rails Entrance Stairs-Rails: None Home Layout: One level Bathroom Shower/Tub: Optometrist: Yes  Lives With: Spouse, Family, Other (Comment) (25 y/o child) IADL History Homemaking Responsibilities: Yes (pt assisting wife with IADLs such as vacuuming and yard work) Occupation: Full time employment 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, retail and Hobbies: spending time with family Prior Function Level of Independence: Independent with gait, Independent with transfers, Independent with homemaking with ambulation, Independent with basic ADLs, Requires assistive device for independence (pt has been using the walking stick for mobility after wound formed on the Lt toe)  Able to Take Stairs?: Yes Driving: Yes Vision Baseline Vision/History: 1 Wears glasses;6 Macular Degeneration;5 Retinopathy (wears for reading) Ability to See in Adequate Light: 0 Adequate Patient Visual Report: No change from baseline Vision Assessment?: No apparent visual deficits Perception  Perception: Within Functional Limits Praxis Praxis: Intact Cognition Overall Cognitive Status: Within Functional Limits for tasks assessed Arousal/Alertness: Awake/alert Orientation Level: Person;Place;Situation Person:  Oriented Place: Oriented Situation: Oriented Year: 2022 Month: November Day of Week: Correct Memory: Appears intact Immediate Memory Recall: Sock;Blue;Bed Memory Recall Sock: Without Cue Memory Recall Blue: Without Cue Memory Recall Bed: Without Cue Awareness: Appears intact Problem Solving: Appears intact Behaviors: Other (comment) (poor pain tolerance) Safety/Judgment: Impaired Sensation Sensation Light Touch: Impaired by gross assessment (PN in hands and feet at baseline) Hot/Cold: Appears Intact Proprioception: Impaired by gross assessment Stereognosis: Appears Intact Additional Comments: bilateral neuropathy. Decreaed sensation distally > proximally and worse on the L > R Coordination Gross Motor Movements are Fluid and Coordinated: No Fine Motor Movements are Fluid and Coordinated: No Coordination and Movement Description: Guarded functional movements due to pain, swelling in torso Finger Nose Finger Test: Shakiness which pt attributes to feelings of anxiety Heel Shin Test: Limited by some hip flexor weakness but overall WFL Motor  Motor Motor: Abnormal postural alignment and control Motor - Skilled Clinical Observations: Global weakness and deconditioning 2/2 prolonged and complicated hospital course. Significant pain limiting functional mobility.  Trunk/Postural Assessment  Cervical Assessment Cervical Assessment: Exceptions to [facility] (forward head) Thoracic Assessment Thoracic Assessment: Exceptions to [facility] (rounded shoulders, swelling Rt>Lt trunk) Lumbar Assessment Lumbar Assessment: Exceptions to [facility] (posterior pelvic tilt) Postural Control Postural Control: Deficits on evaluation (limited by pain) Trunk Control: pain limiting  Balance Balance Balance Assessed: Yes Static Sitting Balance Static Sitting - Balance Support: Feet supported Static Sitting - Level of Assistance: 5: Stand by assistance Dynamic Sitting Balance Sitting balance - Comments: Min A- donning  overhead shirt Extremity/Trunk Assessment RUE Assessment RUE Assessment: Not tested (due to pain and very limited tolerance to sitting EOB) LUE Assessment LUE Assessment: Not tested (due to pain and very limited tolerance to activity/sitting EOB)  Care Tool Care Tool Self Care Eating    Total A    Oral Care  Oral care, brush teeth, clean dentures activity did not occur: Refused (due to pain)      Bathing   Body parts bathed by patient: Right arm;Left arm;Chest;Abdomen;Front perineal area;Buttocks;Face Body parts bathed by helper: Right upper leg;Left upper leg;Right lower leg;Left lower leg   Assist Level: Maximal Assistance - Patient 24 - 49%    Upper Body Dressing(including orthotics)   What is the patient wearing?: Pull over shirt   Assist Level: Maximal  Assistance - Patient 25 - 49%    Lower Body Dressing (excluding footwear)   What is the patient wearing?: Pants;Underwear/pull up Assist for lower body dressing: Maximal Assistance - Patient 25 - 49%    Putting on/Taking off footwear   What is the patient wearing?: Non-skid slipper socks;Ted hose Assist for footwear: Total Assistance - Patient < 25%       Care Tool Toileting Toileting activity Toileting Activity did not occur (Clothing management and hygiene only): N/A (no void or bm)       Care Tool Bed Mobility Roll left and right activity   Roll left and right assist level: Minimal Assistance - Patient > 75%    Sit to lying activity   Sit to lying assist level: Moderate Assistance - Patient 50 - 74%    Lying to sitting on side of bed activity   Lying to sitting on side of bed assist level: the ability to move from lying on the back to sitting on the side of the bed with no back support.: Moderate Assistance - Patient 50 - 74%     Care Tool Transfers Sit to stand transfer Sit to stand activity did not occur: Safety/medical concerns (pain)      Chair/bed transfer Chair/bed transfer activity did not occur:  Safety/medical concerns       Toilet transfer Toilet transfer activity did not occur: N/A (not assessed due to profound pain)       Care Tool Cognition  Expression of Ideas and Wants Expression of Ideas and Wants: 3. Some difficulty - exhibits some difficulty with expressing needs and ideas (e.g, some words or finishing thoughts) or speech is not clear  Understanding Verbal and Non-Verbal Content Understanding Verbal and Non-Verbal Content: 3. Usually understands - understands most conversations, but misses some part/intent of message. Requires cues at times to understand   Memory/Recall Ability Memory/Recall Ability : Current season;Location of own room;That he or she is in a hospital/hospital unit   Refer to Care Plan for [place] 1 OT Short Term Goal 1 (Week 1): Pt will tolerate sitting EOB ~10 minutes during an ADL session to demonstrate improvement in pain mgt OT Short Term Goal 2 (Week 1): Pt will complete 1/3 components of donning pants with AE as needed OT Short Term Goal 3 (Week 1): Pt will complete LB dressing at sit<stand level using LRAD  Recommendations for other services: Neuropsych   Skilled Therapeutic Intervention Skilled OT session completed with focus on initial evaluation, education on OT role/POC, and establishment of patient-centered goals.   Pt greeted in bed, c/o 7/10 back pain but opting to push through without pain medicine. Provided him some lavender aromatherapy to address feelings of anxiety, pt quite shaky which he attributes to anxiety. He was also very diaphoretic, RN informed. Pt denied lightheadedness. Due to lack of back brace and no orders for back precautions, called MD who provided verbal order for back precautions at this time. Educated pt on back precautions and completed supine<sit with Max A of 1. Pt barely able to tolerate sitting EOB during UB self care due to pain, required Max A overall. Called RN x3 for pain  medicine and she arrived at end of session. LB self care was completed bedlevel with Max-Total A due to pain. Noted very swollen area below Rt underarm. Medical team informed. Pt remained in bed at close of session, in care of RN to receive his pain medicine.  02 sats on 3L 96% when EOB and when in bed  ADL ADL Eating: Dependent (per most recent staff documentation) Grooming: Maximal assistance Where Assessed-Grooming: Edge of bed Upper Body Bathing: Supervision/safety Where Assessed-Upper Body Bathing: Edge of bed Lower Body Bathing: Maximal assistance Where Assessed-Lower Body Bathing: Edge of bed;Bed level Upper Body Dressing: Maximal assistance Where Assessed-Upper Body Dressing: Edge of bed Lower Body Dressing: Maximal assistance;Dependent Where Assessed-Lower Body Dressing: Bed level Toileting: Not assessed Toilet Transfer: Not assessed Tub/Shower Transfer: Not assessed ADL Comments: unable to assess transfer abilities due to heightened pain, very limited tolerance sitting EOB due to pain   Discharge Criteria: Patient will be discharged from OT if patient refuses treatment 3 consecutive times without medical reason, if treatment goals not met, if there is a change in medical status, if patient makes no progress towards goals or if patient is discharged from hospital.  The above assessment, treatment plan, treatment alternatives and goals were discussed and mutually agreed upon: by patient  Skeet Simmer 08/31/2021, 12:47 PM

## 2021-08-30 NOTE — Progress Notes (Signed)
Patient is A&O x 4 and able to make his needs known. Patient has o2 continuous at 2 L., SP02 96% on 2L. Bilateral upper lungs wheezing noted, bilateral lower lobes diminished. Bilateral arms +2 edema. Triple lumen right Brachial, dressing CDI. ABD soft non distended, patient reports no BM since 11/1. Patient states his PO intake has not been good, no appetite. Foley in place draining clear yellow urine. Orders are to have catheter  remain at this time. CHG bath completed as ordered.  Bilateral lower legs edema +2, skin warm to touch and positive pedal pulses. Cap refill less than 3 seconds. Bilateral lower leg weakness. Left great toe lateral foot ulcer, no drainage, scabbed. Patient has sacrum wound, below sacrum excoriation and mad noted.  Dressing removed, sacrum washed with soap and water dried, santyl  applied and dressing applied per order. Call light and personal items within reach. Safety maintained.

## 2021-08-30 NOTE — Discharge Summary (Signed)
Physician Discharge Summary  Jose [name] VZC:[id number] DOB: May 09, 1976 DOA: 08/06/2021  PCP: [name], NP  Admit date: 08/06/2021 Discharge date: 08/30/2021  Admitted From: Home Discharge disposition: CIR   Code Status: Full Code   Discharge Diagnosis:   Principal Problem:   Osteomyelitis of thoracic spine ([place]) Active Problems:   Type 2 diabetes mellitus with diabetic polyneuropathy, without long-term current use of insulin (HCC)   Hyperlipidemia   GAD (generalized anxiety disorder)   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)   Essential hypertension   CKD (chronic kidney disease) stage 3, GFR 30-59 ml/min (HCC)   Pleural effusion, bilateral    Chief complaint: Worsening back pain  Brief narrative: Jose [name] is a 45 y.o. male with PMH significant for DM2 with complications including retinopathy, neuropathy, microalbuminuria; HTN, HLD, CKD 3 (baseline creatinine 1.1-1.2) who was last hospitalized from 9/28-10/10 for MSSA bacteremia/T4-T5 osteomyelitis and discharged home on IV antibiotics.  However he continued to have worsening back pain and was barely able to get up out of his bed and hence brought back to ED on 10/14.   MRI thoracic spine confirmed the finding and also showed a possible paraspinal phlegmon, interval T5 pathological fracture and new 4 mm of bony retropulsion.  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  There are also multiple small subcentimeter fluid collection in the left posterior paraspinal area suggestive of tiny abscesses.  Admitted to hospitalist service. Neurosurgery consultation was obtained. On 10/14, patient underwent a 45 laminectomy, left T5 pedicle ectomy and spinal cord decompression. His hospital course was also complicated by acute anemia requiring EGD, acute hypoxic respiratory failure requiring intubation, successfully extubated  10/17 - Hgb drop to  6.2, coffee ground emesis s/p EGD showed circumferential esophagitis, nonbleeding duodenal ulcers and old blood in the stomach 10/17 - hypoxia and pccm consult  10/19-10/24 -mechanical ventilation with successful extubation 10/25 - Transferred to [facility]  See below for details.   Subjective: Patient was seen and examined this morning.  Sitting up in chair.  Not in distress.  Feeding tube was removed yesterday at patient's request.   He failed voiding trial and had Foley catheter reinserted.  Hospital course: Acute T4-T5 osteomyelitis MSSA bacteremia -Imaging and procedures as mentioned above. -Intra-Op culture grew MSSA -Per ID recommendation, patient is currently on IV Ancef, planned for a total of 6 weeks until 09/24/2021. -On 11/6, patient reported an area of tenderness in the right upper back with no local signs of cellulitis or underlying collection.  WBC count remain normal.  No fever.  If tenderness does not improve, may need a repeat scan to rule out localized collection related to osteomyelitis and bacteremia.  Acute hypoxic respiratory failure Aspiration pneumonia -On 10/17, patient was noted to be hypoxic which worsened in he required intubation on 10/19, subsequently extubated on 10/24.  This was suspected to be due to aspiration pneumonia.  He completed a course of Unasyn. -Repeat chest x-ray 11/3 showed hypoventilation and progression of airspace disease in the left likely due to atelectasis or infiltrate. -Clinically patient has no fever, WBC count improved to normal but he continues to have crackles on left lung base. -Continue to follow aspiration precautions. -Recent echo from 10/5 showed EF of 60 to 65%. -PE was also considered.  VQ scan showed intermediate probability.  DVT scan of lower extremities were negative. Patient could not be anticoagulated empirically because of active GI bleeding.  Dysphagia Malnutrition -Probably related to esophagitis and generalized  deconditioning.  Albumin level has remained less than 2 constantly. -speech therapy consultation was obtained.  Patient was given tube feeding.  Once dysphagia started improving, oral intake was encouraged but patient continued to have slow oral intake.  Feeding tube was maintained for nightly feeding.  However on 11/5, feeding tube was removed at patient and family's request.  Encourage oral intake and hydration.  Allowed for regular diet  Recent Labs  Lab 08/24/21 0435 08/25/21 0525 08/26/21 0345  ALBUMIN 1.8* 1.7* 1.8*   Acute blood loss anemia Acute GI bleeding Candida esophagitis -EGD 10/17 showed showed circumferential esophagitis, nonbleeding duodenal ulcers and old blood in the stomach.  Esophageal biopsy confirmed Candida esophagitis.  Patient completed a course of Diflucan on 11/2. -Patient continued to have intermittent GI bleeding with drop in hemoglobin and hence underwent repeat EGD on 10/31.  It showed healing esophagitis did not show any active signs of bleeding. -Currently on Protonix and tube feedings. -Received a total of 11 units of PRBC this hospitalization.  For the last several days, hemoglobin is stable between 8 and 9. -Continue to monitor Recent Labs    05/18/21 1703 05/19/21 0431 05/20/21 0359 07/22/21 1051 07/23/21 0508 07/24/21 0550 08/25/21 0525 08/26/21 0345 08/27/21 0320 08/28/21 0346 08/29/21 0353  HGB 11.2*   < > 9.0*   < > 8.6*   < > 8.5* 8.7* 8.9* 8.8* 8.5*  MCV 84.9   < > 87.3   < > 81.9   < > 92.8 92.5 93.5 94.8 96.0  VITAMINB12  --   --  1,734*  --  252  --   --   --   --   --   --   FOLATE  --   --  43.8  --  13.3  --   --   --   --   --   --   FERRITIN 167  --  126  --  193  --   --   --   --   --   --   TIBC  --   --  250  --  223*  --   --   --   --   --   --   IRON  --   --  59  --  21*  --   --   --   --   --   --   RETICCTPCT  --   --   --   --  1.1  --   --   --   --   --   --    < > = values in this interval not displayed.   AKI  on CKD 3a -Baseline creatinine 1.1-1.2. -Creatinine worsened to peak at 4.53, probably because of ATN. Creatinine is gradually improving, 1.56 on last check on 11/5 Recent Labs    08/20/21 0327 08/21/21 0515 08/22/21 0530 08/23/21 0450 08/24/21 0435 08/25/21 0525 08/26/21 0345 08/27/21 0320 08/28/21 0346 08/29/21 0353  BUN 114* 96* 76* 81* 70* 60* 52* 49* 47* 48*  CREATININE 2.94* 2.26* 1.99* 2.21* 2.18* 2.02* 1.83* 1.64* 1.52* 1.56*   Type 2 diabetes mellitus Hypoglycemic episodes -A1c 7.5 on 8/18.  Because of inconsistent oral intake and requirement of tube feeding, his sugar control has been tough ranging from hypoglycemia to hyperglycemia.   -Last 24 hours, blood sugar level has been more controlled.  He no longer has tube feeding.   -  Currently on Semglee 12 units daily along with sliding scale insulin.  At this time, I would continue him on the same.  Further adjustment to be made at the rehab. Recent Labs  Lab 08/29/21 1630 08/29/21 1931 08/30/21 0000 08/30/21 0409 08/30/21 0849  GLUCAP 107* 144* 181* 103* 81   Essential hypertension -Blood pressure controlled currently on Lopressor 50 mg twice daily and amlodipine 10 mg daily.  Continue to monitor.   Sacral decubitus ulcer stage II - POA -wound care on board.  Constipation -10/13, CT abdomen showed large burden of stool and stool balls throughout the colon.  Constipation improved with bowel regimen.  Continue bowel regimen.  Acute urinary retention -CT abdomen on admission showed distended urinary bladder with urinary retention, probably related to constipation.  -Failed voiding trial on 11/5.  Foley catheter reinserted.  I will start the patient on Flomax at discharge.   Allergies as of 08/30/2021       Reactions   Cranberry Itching   Hm Lidocaine Patch [lidocaine] Dermatitis   Blisters skin    Melatonin Other (See Comments)   nightmares        Medication List     STOP taking these medications     chlorproMAZINE 25 MG tablet Commonly known as: THORAZINE   DULoxetine 60 MG capsule Commonly known as: CYMBALTA   glimepiride 1 MG tablet Commonly known as: AMARYL   hydrALAZINE 50 MG tablet Commonly known as: APRESOLINE   lactulose 10 GM/15ML solution Commonly known as: CHRONULAC   Oxycodone HCl 10 MG Tabs   tadalafil 20 MG tablet Commonly known as: CIALIS   Trulicity 3 DQ/2.2WL Sopn Generic drug: Dulaglutide   VITAMIN C PO       TAKE these medications    acetaminophen 500 MG tablet Commonly known as: TYLENOL Take 500 mg by mouth every 6 (six) hours as needed for mild pain, fever or headache.   albuterol 108 (90 Base) MCG/ACT inhaler Commonly known as: VENTOLIN HFA Inhale 1 puff into the lungs every 4 (four) hours as needed for wheezing or shortness of breath.   ALPRAZolam 0.25 MG tablet Commonly known as: XANAX Take 0.25 mg by mouth 2 (two) times daily as needed for anxiety.   amLODipine 10 MG tablet Commonly known as: NORVASC Take 1 tablet (10 mg total) by mouth daily.   atorvastatin 40 MG tablet Commonly known as: LIPITOR Take 1 tablet (40 mg total) by mouth daily.   bisacodyl 10 MG suppository Commonly known as: DULCOLAX Place 1 suppository (10 mg total) rectally daily as needed for moderate constipation.   ceFAZolin  IVPB Commonly known as: ANCEF Inject 2 g into the vein every 8 (eight) hours. Indication:  Osteomyelitis  First Dose: No Last Day of Therapy:  09/04/2022  Labs - Once weekly:  CBC/D and BMP, Labs - Every other week:  ESR and CRP Method of administration: IV Push Method of administration may be changed at the discretion of home infusion pharmacist based upon assessment of the patient and/or caregiver's ability to self-administer the medication ordered. What changed: Another medication with the same name was added. Make sure you understand how and when to take each.   ceFAZolin  IVPB Commonly known as: ANCEF Inject 2 g into the  vein every 8 (eight) hours. Indication:  vertebral osteomyelitis First Dose: No Last Day of Therapy:  09/24/21 Labs - Once weekly:  CBC/D and BMP, Labs - Every other week:  ESR and CRP Method of administration: IV Push  Method of administration may be changed at the discretion of home infusion pharmacist based upon assessment of the patient and/or caregiver's ability to self-administer the medication ordered. What changed: You were already taking a medication with the same name, and this prescription was added. Make sure you understand how and when to take each.   collagenase ointment Commonly known as: SANTYL Apply topically daily.   cyclobenzaprine 10 MG tablet Commonly known as: FLEXERIL Take 1 tablet (10 mg total) by mouth 3 (three) times daily as needed for muscle spasms. What changed:  when to take this reasons to take this   Darbepoetin Alfa 150 MCG/0.3ML Sosy injection Commonly known as: ARANESP Inject 0.3 mLs (150 mcg total) into the skin every Monday at 6 PM. Start taking on: August 31, 2021   feeding supplement (VITAL 1.5 CAL) Liqd Place 1,000 mLs into feeding tube daily. What changed:  how much to take how to take this when to take this   feeding supplement (PROSource TF) liquid Place 45 mLs into feeding tube 3 (three) times daily. What changed: You were already taking a medication with the same name, and this prescription was added. Make sure you understand how and when to take each.   gabapentin 100 MG capsule Commonly known as: NEURONTIN Take 2 capsules (200 mg total) by mouth 2 (two) times daily. What changed:  medication strength See the new instructions.   insulin aspart 100 UNIT/ML injection Commonly known as: novoLOG Inject 0-20 Units into the skin every 4 (four) hours.   insulin glargine-yfgn 100 UNIT/ML injection Commonly known as: SEMGLEE Inject 0.12 mLs (12 Units total) into the skin at bedtime.   Lucentis 0.3 MG/0.05ML Soln Generic drug:  Ranibizumab 1 Dose by Intravitreal route every 3 (three) months.   metoprolol tartrate 50 MG tablet Commonly known as: LOPRESSOR Take 1 tablet (50 mg total) by mouth 2 (two) times daily.   MULTIVITAMIN ADULT PO Take 1 tablet by mouth daily.   mupirocin ointment 2 % Commonly known as: BACTROBAN Apply 1 application topically 2 (two) times daily.   oxyCODONE-acetaminophen 5-325 MG tablet Commonly known as: PERCOCET/ROXICET Take 1 tablet by mouth every 6 (six) hours as needed for moderate pain.   pantoprazole 40 MG tablet Commonly known as: PROTONIX Take 1 tablet (40 mg total) by mouth 2 (two) times daily.   polyethylene glycol 17 g packet Commonly known as: MIRALAX / GLYCOLAX Take 17 g by mouth daily as needed for moderate constipation, mild constipation or severe constipation.   senna-docusate 8.6-50 MG tablet Commonly known as: Senokot-S Take 1 tablet by mouth 2 (two) times daily.   sucralfate 1 GM/10ML suspension Commonly known as: CARAFATE Take 10 mLs (1 g total) by mouth 4 (four) times daily -  with meals and at bedtime.   tamsulosin 0.4 MG Caps capsule Commonly known as: FLOMAX Take 1 capsule (0.4 mg total) by mouth daily after breakfast. Start taking on: August 31, 2021   tobramycin 0.3 % ophthalmic solution Commonly known as: TOBREX Place 1 drop into both eyes See admin instructions. Begin 1 day prior to treatment and continue the day of treatment and for one full day after treatment.   traZODone 100 MG tablet Commonly known as: DESYREL TAKE 1 TABLET(100 MG) BY MOUTH AT BEDTIME AS NEEDED FOR SLEEP What changed:  how much to take how to take this when to take this additional instructions   VITAMIN D PO Take 1 tablet by mouth daily.  Durable Medical Equipment  (From admission, onward)           Start     Ordered   08/25/21 0929  For home use only DME Air overlay mattress  Once        08/25/21 0928              Discharge  Care Instructions  (From admission, onward)           Start     Ordered   08/29/21 0000  Change dressing on IV access line weekly and PRN  (Home infusion instructions - [facility] )        08/29/21 1053   08/29/21 0000  Discharge wound care:       Comments: Clean the buttock area with no rinse cleanser. Pat dry and apply a nickel thick layer of Santyl over the blackened area, cover with a moistened saline gauze and secure with foam dressing. Carefully peel back foam dressing to apply santyl daily. May use sacral foam dressing up to 3 days unless soiled.  Apply Mupirocin ointment to the left great toe, cover with non-adherent gauze and wrap with Conform Mancel Parsons Kellie Simmering # [phone]) Apply twice daily   08/29/21 1054            Discharge Instructions:  Diet Recommendation:  Nightly tube feeding. Carb controlled diet the day   _0 @  Follow ups:    Follow-up Information     [name], NP Follow up.   Specialty: Family Medicine Contact information: [place] [address] [phone]                 Wound care:   Wound / Incision (Open or Dehisced) 07/22/21 Diabetic ulcer Toe (Comment  which one) Left Big toe ulcer (Active)  Date First Assessed/Time First Assessed: 07/22/21 (c)    Wound Type: Diabetic ulcer  Location: Toe (Comment  which one)  Location Orientation: Left  Wound Description (Comments): Big toe ulcer  Present on Admission: Yes    Assessments 07/26/2021 10:45 PM 08/29/2021  8:00 AM  Wound Image       Dressing Type Gauze (Comment);Other (Comment) --  Dressing Changed Changed --  Dressing Status Clean;Dry;Intact --  Dressing Change Frequency Daily --  Site / Wound Assessment Clean;Dry --  Peri-wound Assessment Intact --  Drainage Amount None None  Treatment Cleansed --     No Linked orders to display     Incision (Closed) 08/07/21 Back (Active)  Date First Assessed/Time First Assessed: 08/07/21 1803    Location: Back    Assessments 08/07/2021  6:15 PM 08/29/2021  8:00 AM  Dressing Type Liquid skin adhesive;Honeycomb Liquid skin adhesive  Dressing Clean;Dry;Intact --  Site / Wound Assessment Clean;Dry --  Closure Skin glue --  Drainage Amount None None     No Linked orders to display     Pressure Injury 08/10/21 Coccyx Medial Stage 2 -  Partial thickness loss of dermis presenting as a shallow open injury with a red, pink wound bed without slough. (Active)  Date First Assessed/Time First Assessed: 08/10/21 1700   Location: (c) Coccyx  Location Orientation: Medial  Staging: Stage 2 -  Partial thickness loss of dermis presenting as a shallow open injury with a red, pink wound bed without slough.  Present o...    Assessments 08/10/2021  5:00 PM 08/28/2021  8:05 PM  Dressing Type Foam - Lift dressing to assess site every shift --  Dressing Clean;Dry;Intact  Clean;Dry;Intact  Dressing Change Frequency -- Twice a day  Site / Wound Assessment -- Dressing in place / Unable to assess  Peri-wound Assessment -- Intact  Wound Length (cm) 1 cm --  Wound Width (cm) 0.5 cm --  Wound Surface Area (cm^2) 0.5 cm^2 --     No Linked orders to display     Pressure Injury 08/14/21 Buttocks Bilateral Deep Tissue Pressure Injury - Purple or maroon localized area of discolored intact skin or blood-filled blister due to damage of underlying soft tissue from pressure and/or shear. DTI (Active)  Date First Assessed/Time First Assessed: 08/14/21 0800   Location: Buttocks  Location Orientation: Bilateral  Staging: Deep Tissue Pressure Injury - Purple or maroon localized area of discolored intact skin or blood-filled blister due to damage of und...    Assessments 08/14/2021  7:22 PM 08/29/2021  8:16 PM  Dressing Type Foam - Lift dressing to assess site every shift --  Dressing Clean;Dry;Intact Clean;Intact     No Linked orders to display    Discharge Exam:   Vitals:   08/29/21 2339 08/30/21 0340 08/30/21 0500  08/30/21 0706  BP: 133/77 (!) 151/78  (!) 164/81  Pulse: 68 77  87  Resp: _0 Temp:  97.8 F (36.6 C)  97.8 F (36.6 C)  TempSrc:  Oral  Oral  SpO2: 98% 97%  96%  Weight:   99.8 kg   Height:        Body mass index is 30.68 kg/m.  General exam: Pleasant, middle-aged Caucasian male.  Looks older for his age. Skin: No rashes, lesions or ulcers. HEENT: Atraumatic, normocephalic, no obvious bleeding Lungs: left lung base with mild crackles. CVS: Regular rate and rhythm, no murmur GI/Abd soft, nontender, nondistended, bowel sound present CNS: Alert, awake, oriented x3 Psychiatry: Anxious look Extremities: No pedal edema, no calf tenderness Musculoskeletal: Has area of tenderness in the right upper back with no local signs of cellulitis or underlying fluctuation.  Time coordinating discharge: 35 minutes   The results of significant diagnostics from this hospitalization (including imaging, microbiology, ancillary and laboratory) are listed below for reference.    Procedures and Diagnostic Studies:   DG Thoracic Spine 2 View  Result Date: 08/07/2021 CLINICAL DATA:  T4-5 laminectomy/decompression with micro discectomy EXAM: THORACIC SPINE 2 VIEWS; DG C-ARM 1-60 MIN-NO REPORT COMPARISON:  CTA chest dated 08/06/2021 FLUOROSCOPY TIME:  19 seconds 9.8 mGy FINDINGS: Intraoperative fluoroscopic spot radiograph of the thoracic spine during T4-5 laminectomy/decompression. IMPRESSION: Intraoperative fluoroscopic images during T4-5 laminectomy/decompression. Electronically Signed   By: Julian Hy M.D.   On: 08/07/2021 20:22   CT Angio Chest PE W and/or Wo Contrast  Result Date: 08/06/2021 CLINICAL DATA:  Chest pain, shortness of breath, rule out PE EXAM: CT ANGIOGRAPHY CHEST WITH CONTRAST TECHNIQUE: Multidetector CT imaging of the chest was performed using the standard protocol during bolus administration of intravenous contrast. Multiplanar CT image reconstructions and MIPs were  obtained to evaluate the vascular anatomy. CONTRAST:  75m OMNIPAQUE IOHEXOL 350 MG/ML SOLN COMPARISON:  CT chest, 07/22/2021 MR thoracic spine, 08/06/2021 FINDINGS: Cardiovascular: Satisfactory opacification of the pulmonary arteries to the segmental level. No evidence of pulmonary embolism. Cardiomegaly. Left coronary artery calcifications. Trace pericardial effusion. Right upper extremity PICC, tip position near the superior cavoatrial junction. Mediastinum/Nodes: Unchanged enlarged right hilar and subcarinal lymph nodes, largest right hilar nodes measuring up to 2.3 x 2.1 cm (series 4, image 50). Thyroid gland, trachea, and esophagus demonstrate no significant findings. Lungs/Pleura:  Moderate bilateral pleural effusions associated atelectasis or consolidation, new compared to prior examination. Upper Abdomen: No acute abnormality. Musculoskeletal: No chest wall abnormality. There is substantial interval worsening of destructive lytic change about the T4-T5 disc space (series 10, image 94). Overlying paravertebral soft tissue thickening (series 4, image 50). Review of the MIP images confirms the above findings. IMPRESSION: 1. Negative examination for pulmonary embolism. 2. Moderate bilateral pleural effusions and associated atelectasis or consolidation, new compared to prior examination. 3. There is substantial interval worsening of destructive lytic change about the T4-T5 disc space. Overlying paravertebral soft tissue thickening. Findings are consistent with worsened discitis osteomyelitis but in general better assessed by same-day MR. 4. Unchanged enlarged right hilar and subcarinal lymph nodes, likely reactive. 5. Cardiomegaly and coronary artery disease. Electronically Signed   By: Delanna Ahmadi M.D.   On: 08/06/2021 13:42   MR THORACIC SPINE W WO CONTRAST  Addendum Date: 08/06/2021   ADDENDUM REPORT: 08/06/2021 11:59 ADDENDUM: CORRECTION: Moderate to severe left eccentric canal stenosis is at the  superior T5 level, not T4. Findings in the report and this addendum discussed with Dr. Sherry Ruffing via telephone at 11:55 AM. Electronically Signed   By: Margaretha Sheffield M.D.   On: 08/06/2021 11:59   Result Date: 08/06/2021 CLINICAL DATA:  Mid-back pain Known osteomyelitis of the T-spine, recent biopsy completed EXAM: MRI THORACIC WITHOUT AND WITH CONTRAST TECHNIQUE: Multiplanar and multiecho pulse sequences of the thoracic spine were obtained without and with intravenous contrast. CONTRAST:  38m GADAVIST GADOBUTROL 1 MMOL/ML IV SOLN COMPARISON:  MRI thoracic spine 07/23/2021. FINDINGS: Alignment: T5 bony retropulsion is detailed below. Otherwise, normal alignment. Vertebrae: Redemonstrated abnormal bone marrow edema and enhancement involving the entirety of the T4 and T5 vertebral bodies as well as the T5 posterior elements. Progressive T5 height loss (50-60%), compatible with pathologic fracture. New 4 mm of bony retropulsion along the superior T5 endplate. Cord:  No definite cord signal abnormality. Paraspinal and other soft tissues: There is increased paraspinal enhancement and edema surrounding the T4 and T5 levels. There is new enhancement in the posterior left paraspinal soft tissues with tiny (subcentimeter) fluid collections in this region (for example series 15, image 13). Disc levels: At T4-T5, there is new/increased epidural enhancement which measures up to 6 mm in thickness ventrally. Abnormal enhancement involves bilateral T4-T5 foramina. Additionally, there is new 4 mm of bony retropulsion along the superior T5 endplate. Resulting new moderate to severe left eccentric canal stenosis at T4 with cord deformity (series 11, image 15). IMPRESSION: 1. Redemonstrated abnormal bone marrow signal and enhancement involving the T4 and T5 levels with increased surrounding paraspinal phlegmon and new epidural phlegmon (6 mm thick ventrally). Interval T5 pathologic fracture (50-60% height loss) with new 4 mm of  bony retropulsion. Constellation of findings results in new moderate to severe left eccentric canal stenosis at T4 with cord deformity. 2. Overall, findings are most likely secondary to progressive discitis/osteomyelitis. Superimposed osseous metastatic disease or multiple myeloma is not excluded by imaging. Recommend correlation with the results of reported recent biopsy. 3. Multiple small (subcentimeter) fluid collections in the left posterior paraspinal phlegmon could represent tiny abscesses. Electronically Signed: By: FMargaretha SheffieldM.D. On: 08/06/2021 11:49   [place]raoperative  Result Date: 08/07/2021 CLINICAL DATA:  Ultrasound was provided for use by the ordering physician.  No provider Interpretation or professional fees incurred.    CT ABDOMEN PELVIS W CONTRAST  Result Date: 08/06/2021 CLINICAL DATA:  Abdominal distension, chills ongoing antibiotic therapy for  osteomyelitis EXAM: CT ABDOMEN AND PELVIS WITH CONTRAST TECHNIQUE: Multidetector CT imaging of the abdomen and pelvis was performed using the standard protocol following bolus administration of intravenous contrast. CONTRAST:  47m OMNIPAQUE IOHEXOL 350 MG/ML SOLN, additional oral enteric contrast COMPARISON:  07/23/2021 FINDINGS: Lower chest: Please see separately reported examination of the chest. Hepatobiliary: No solid liver abnormality is seen. No gallstones, gallbladder wall thickening, or biliary dilatation. Pancreas: Unremarkable. No pancreatic ductal dilatation or surrounding inflammatory changes. Spleen: Normal in size without significant abnormality. Adrenals/Urinary Tract: Adrenal glands are unremarkable. Kidneys are normal, without renal calculi, solid lesion, or hydronephrosis. Distended urinary bladder, measuring up to 19.0 cm. Stomach/Bowel: Stomach is within normal limits. Appendix is not clearly visualized. No evidence of bowel wall thickening, distention, or inflammatory changes. Generally large burden of stool and  stool balls throughout the colon. Vascular/Lymphatic: Aortic atherosclerosis. No enlarged abdominal or pelvic lymph nodes. Reproductive: No mass or other significant abnormality. Other: Anasarca.  No abdominopelvic ascites. Musculoskeletal: No acute or significant osseous findings. IMPRESSION: 1. Generally large burden of stool and stool balls throughout the colon. 2. Distended urinary bladder, measuring up to 19.0 cm. Correlate for urinary retention. 3. Anasarca. Aortic Atherosclerosis (ICD10-I70.0). Electronically Signed   By: ADelanna AhmadiM.D.   On: 08/06/2021 13:37   DG Chest Portable 1 View  Result Date: 08/06/2021 CLINICAL DATA:  Chest pain EXAM: PORTABLE CHEST 1 VIEW COMPARISON:  Ten days ago FINDINGS: Haziness in the bilateral lower chest with indistinct/streaky density, also seen previously. Normal range heart size for low volume portable technique. Right PICC with tip at the upper right atrium. No edema, effusion, or pneumothorax. IMPRESSION: Stable low volume chest with atelectasis or infiltrates at the bases. Electronically Signed   By: JJorje GuildM.D.   On: 08/06/2021 10:39   DG C-Arm 1-60 Min-No Report  Result Date: 08/07/2021 CLINICAL DATA:  T4-5 laminectomy/decompression with micro discectomy EXAM: THORACIC SPINE 2 VIEWS; DG C-ARM 1-60 MIN-NO REPORT COMPARISON:  CTA chest dated 08/06/2021 FLUOROSCOPY TIME:  19 seconds 9.8 mGy FINDINGS: Intraoperative fluoroscopic spot radiograph of the thoracic spine during T4-5 laminectomy/decompression. IMPRESSION: Intraoperative fluoroscopic images during T4-5 laminectomy/decompression. Electronically Signed   By: SJulian HyM.D.   On: 08/07/2021 20:22     Labs:   Basic Metabolic Panel: Recent Labs  Lab 08/24/21 0435 08/25/21 0525 08/26/21 0345 08/27/21 0320 08/28/21 0346 08/29/21 0353  NA 144 143 142 144 141 138  K 4.2 4.3 4.3 4.7 4.3 4.2  CL 115* 116* 115* 116* 114* 112*  CO2 21* 20* 19* 20* 22 21*  GLUCOSE 112* 128*  207* 173* 327* 277*  BUN 70* 60* 52* 49* 47* 48*  CREATININE 2.18* 2.02* 1.83* 1.64* 1.52* 1.56*  CALCIUM 8.1* 8.3* 8.3* 8.4* 8.4* 8.2*  MG  --   --   --  2.1  --   --   PHOS 5.5* 3.9 3.5 3.3  --   --    GFR Estimated Creatinine Clearance: 70.5 mL/min (A) (by C-G formula based on SCr of 1.56 mg/dL (H)). Liver Function Tests: Recent Labs  Lab 08/24/21 0435 08/25/21 0525 08/26/21 0345  ALBUMIN 1.8* 1.7* 1.8*   No results for input(s): LIPASE, AMYLASE in the last 168 hours. No results for input(s): AMMONIA in the last 168 hours. Coagulation profile No results for input(s): INR, PROTIME in the last 168 hours.  CBC: Recent Labs  Lab 08/25/21 0525 08/26/21 0345 08/27/21 0320 08/28/21 0346 08/29/21 0353  WBC 10.8* 9.7 10.9* 8.4 8.9  NEUTROABS 7.8*  --  7.7 5.6 5.5  HGB 8.5* 8.7* 8.9* 8.8* 8.5*  HCT 27.1* 28.4* 29.0* 29.4* 28.8*  MCV 92.8 92.5 93.5 94.8 96.0  PLT 315 325 342 323 318   Cardiac Enzymes: No results for input(s): CKTOTAL, CKMB, CKMBINDEX, TROPONINI in the last 168 hours. BNP: Invalid input(s): POCBNP CBG: Recent Labs  Lab 08/29/21 1630 08/29/21 1931 08/30/21 0000 08/30/21 0409 08/30/21 0849  GLUCAP 107* 144* 181* 103* 81   D-Dimer No results for input(s): DDIMER in the last 72 hours. Hgb A1c No results for input(s): HGBA1C in the last 72 hours. Lipid Profile No results for input(s): CHOL, HDL, LDLCALC, TRIG, CHOLHDL, LDLDIRECT in the last 72 hours. Thyroid function studies No results for input(s): TSH, T4TOTAL, T3FREE, THYROIDAB in the last 72 hours.  Invalid input(s): FREET3 Anemia work up No results for input(s): VITAMINB12, FOLATE, FERRITIN, TIBC, IRON, RETICCTPCT in the last 72 hours. Microbiology No results found for this or any previous visit (from the past 240 hour(s)).   Signed: Terrilee Croak  [facility] 08/30/2021, 10:44 AM

## 2021-08-30 NOTE — Progress Notes (Signed)
Charlett Blake, MD   Physician  Physical Medicine and Rehabilitation  PMR Pre-admission     Signed  Date of Service:  08/28/2021  3:35 PM       Related encounter: ED to Hosp-Admission (Discharged) from 08/06/2021 in [place]      Show:Clear all [x] Written[x] Templated[x] Copied  Added by: [x] Cristina Gong, RN[x] Kirsteins, Luanna Salk, MD  [] Hover for details                                                                                                                                                                                                                                                                                                                                                                                                                                              PMR Admission Coordinator Pre-Admission Assessment   Patient: Jose [name] is an 45 y.o., male MRN: [id number] DOB: Jul 13, 1976 Height: 5' 11"  (180.3 cm) Weight: 97.1 kg   Insurance Information HMO:     PPO:      PCP:      IPA:      80/20:      OTHER:  PRIMARY: [place]      Policy#: [id number]      Subscriber: pt CM Name: Blanch Media      Phone#: [phone]  Fax#: [phone] Pre-Cert#: [id number] approved for 7 days with f/u [facility] phone [phone] ext [phone] fax [phone]      Employer:  Benefits:  Phone #: [phone]     Name: 11/3 Eff. Date: 10/25/2020     Deduct: none      Out of Pocket Max: $4200      Life Max: none CIR: 80%      SNF: 80% 60 days Outpatient: 80%     Co-Pay: 60 visits combined Home Health: 80%      Co-Pay: 100 visits per year DME: 80%     Co-Pay: 20% Providers: in network  SECONDARY: none     Financial Counselor:       Phone#:    The Physiological scientist" for patients in Inpatient Rehabilitation Facilities with attached "Privacy Act [place] Records" was provided and verbally reviewed with: N/A   Emergency Contact Information Contact Information       Name Relation Home Work Mobile    Yancey Spouse     [phone]           Current Medical History  Patient Admitting Diagnosis: Debility   History of Present Illness: 45 year old male with PMH significant for DM 2 with complications including retinopathy, neuropathy, microalbuminuria, HTN, HLD, CKD 3 who was last hospitalized for MSSA bacteremia/T 4-5 osteo and discharged home on IV antibiotics. He continued to have worsening back pain and was immobile and presented to ER on 08/06/21.    MRI confirmed the finding and showed a possible paraspinal phlegmon , interval T 5 pathological fx and new 4 mm bony retropulsion. 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 There were also multiple small sub centimeter fluid collections in the left posterior paraspinal area suggestive of tiny abscesses.    On 10/14 he underwent a 4 to 5 laminectomy, left T 5 pedicle ectomy and spinal cord decompression. Postoperative course complicated by acute anemia requiring EGD, and acute respiratory failure requiring intubation with eventual extubation. Thought due to aspiration PNA. He completed a course of Unasyn.   Dysphagia probably related to esophagitis and generalized deconditioning. SLP consulted. Regular diet with poor intake . Currently on nightly feedings with Cortrak.    EGD 10/17 showed circumferential esophagitis, non bleeding duodenal ulcers and old blood in the stomach.  Esophageal biopsy confirmed Candida esophagitis.  Patient completed a course of Diflucan on 11/2.-Patient continued to have intermittent GI bleeding with drop in hemoglobin and hence underwent repeat EGD on 10/31.  It showed healing  esophagitis did not show any active signs of bleeding.-Currently on Protonix and tube feedings.-Received a total of 11 units of PRBC this hospitalization.  For the last 5 days, hemoglobin is stable between 8 and 9.-Continue to monitor   Per ID recommendation, patient on IV Ancef. AKI on CKD 3 a with creatinine peaked at 4.53 felt due to ATN. Nephrology signed off. Type 2 DM with medication adjusted due to hypoglycemia and tube feeds changes. HTN meds currently on Lopressor and amlodipine. Was on verapamil pta.    Patient's medical record from [facility] has been reviewed by the rehabilitation admission coordinator and physician.   Past Medical History      Past Medical History:  Diagnosis Date   GAD (generalized anxiety disorder)     Hyperlipidemia     Macular degeneration, bilateral     Retinopathy     Type II  diabetes mellitus with complication, uncontrolled      retinopathy, neuropathy, microalbuminuria      Has the patient had major surgery during 100 days prior to admission? Yes   Family History   family history includes ADD / ADHD in his brother and son; Diabetes in his father and mother; Hyperlipidemia in his brother and mother; Stroke in his brother and mother.   Current Medications   Current Facility-Administered Medications:    0.9 %  sodium chloride infusion, , Intravenous, PRN, Darliss Cheney, MD, Last Rate: 10 mL/hr at 08/26/21 1010, New Bag at 08/26/21 1010   acetaminophen (TYLENOL) tablet 650 mg, 650 mg, Oral, Q6H PRN, 650 mg at 08/28/21 1540 **OR** acetaminophen (TYLENOL) suppository 650 mg, 650 mg, Rectal, Q6H PRN, Pahwani, Ravi, MD   albuterol (VENTOLIN HFA) 108 (90 Base) MCG/ACT inhaler 1 puff, 1 puff, Inhalation, Q4H PRN, Orma Flaming, MD, 1 puff at 08/25/21 0948   amLODipine (NORVASC) tablet 10 mg, 10 mg, Oral, Daily, Dahal, Binaya, MD, 10 mg at 08/28/21 0825   bisacodyl (DULCOLAX) suppository 10 mg, 10 mg, Rectal, Daily PRN, Agarwala, Ravi, MD, 10 mg at  08/14/21 1646   ceFAZolin (ANCEF) IVPB 2g/100 mL premix, 2 g, Intravenous, Q8H, Pahwani, Ravi, MD, Last Rate: 200 mL/hr at 08/28/21 1534, 2 g at 08/28/21 1534   Chlorhexidine Gluconate Cloth 2 % PADS 6 each, 6 each, Topical, Daily, Orma Flaming, MD, 6 each at 08/28/21 1057   collagenase (SANTYL) ointment, , Topical, Daily, Darliss Cheney, MD, Given at 08/28/21 1000   Darbepoetin Alfa (ARANESP) injection 150 mcg, 150 mcg, Subcutaneous, Q Mon-1800, Corliss Parish, MD, 150 mcg at 08/24/21 1719   feeding supplement (BOOST / RESOURCE BREEZE) liquid 1 Container, 1 Container, Oral, TID BM, Dahal, Marlowe Aschoff, MD, 1 Container at 08/28/21 1540   feeding supplement (PROSource TF) liquid 45 mL, 45 mL, Per Tube, TID, Dahal, Binaya, MD, 45 mL at 08/28/21 1535   feeding supplement (VITAL 1.5 CAL) liquid 1,000 mL, 1,000 mL, Per Tube, Q24H, Dahal, Binaya, MD, Last Rate: 75 mL/hr at 08/27/21 2008, 1,000 mL at 08/27/21 2008   gabapentin (NEURONTIN) capsule 200 mg, 200 mg, Oral, BID, Pahwani, Ravi, MD, 200 mg at 08/28/21 0825   hydrALAZINE (APRESOLINE) injection 10 mg, 10 mg, Intravenous, Q6H PRN, Marianna Payment, MD, 10 mg at 08/21/21 0453   hydrOXYzine (ATARAX/VISTARIL) tablet 10 mg, 10 mg, Oral, TID PRN, Dahal, Binaya, MD   insulin aspart (novoLOG) injection 0-20 Units, 0-20 Units, Subcutaneous, Q4H, Marianna Payment, MD, 3 Units at 08/28/21 1541   insulin glargine-yfgn (SEMGLEE) injection 12 Units, 12 Units, Subcutaneous, QHS, Dahal, Marlowe Aschoff, MD   lip balm (CARMEX) ointment, , Topical, PRN, Candee Furbish, MD, 75 application at 40/10/27 1616   living well with diabetes book MISC, , Does not apply, Once, Shawna Clamp, MD   MEDLINE mouth rinse, 15 mL, Mouth Rinse, BID, Candee Furbish, MD, 15 mL at 08/28/21 1355   metoprolol tartrate (LOPRESSOR) tablet 50 mg, 50 mg, Oral, BID, Pahwani, Ravi, MD, 50 mg at 08/28/21 0825   mupirocin ointment (BACTROBAN) 2 % 1 application, 1 application, Topical, BID, Shawna Clamp, MD,  1 application at 25/36/64 1000   ondansetron (ZOFRAN) injection 4 mg, 4 mg, Intravenous, Q6H PRN, Marianna Payment, MD, 4 mg at 08/28/21 0826   oxyCODONE-acetaminophen (PERCOCET/ROXICET) 5-325 MG per tablet 1 tablet, 1 tablet, Oral, Q6H PRN, Dahal, Marlowe Aschoff, MD, 1 tablet at 08/28/21 1438   pantoprazole (PROTONIX) EC tablet 40 mg, 40 mg, Oral, BID, Pahwani, Ravi,  MD, 40 mg at 08/28/21 0825   polyethylene glycol (MIRALAX / GLYCOLAX) packet 17 g, 17 g, Oral, Daily PRN, Darliss Cheney, MD   senna-docusate (Senokot-S) tablet 2 tablet, 2 tablet, Oral, QHS PRN, Pahwani, Ravi, MD   sodium chloride flush (NS) 0.9 % injection 10-40 mL, 10-40 mL, Intracatheter, Q12H, Pahwani, Ravi, MD, 10 mL at 08/28/21 1055   sodium chloride flush (NS) 0.9 % injection 10-40 mL, 10-40 mL, Intracatheter, PRN, Darliss Cheney, MD   sodium phosphate (FLEET) 7-19 GM/118ML enema 1 enema, 1 enema, Rectal, Daily PRN, Bergman, Meghan D, NP   sucralfate (CARAFATE) 1 GM/10ML suspension 1 g, 1 g, Oral, TID WC & HS, Pahwani, Ravi, MD, 1 g at 08/28/21 1354   traZODone (DESYREL) tablet 50 mg, 50 mg, Oral, QHS PRN, Terrilee Croak, MD   Patients Current Diet:  Diet Order                  DIET DYS 3 Room service appropriate? Yes with Assist; Fluid consistency: Thin  Diet effective now                       Precautions / Restrictions Precautions Precautions: Fall, Back Precaution Comments: reviewed back precautions for bed mobility, cortrak Restrictions Weight Bearing Restrictions: No    Has the patient had 2 or more falls or a fall with injury in the past year? 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 Independent   Indoor Mobility: Did the patient need assistance with walking from room to room (with or without device)? Independent   Stairs:  Did the patient need assistance with internal or external stairs (with or without device)? Independent   Functional Cognition: Did the patient need help planning regular tasks such as shopping or remembering to take medications? Independent   Patient Information Are you of Hispanic, Latino/a,or Spanish origin?: A. No, not of Hispanic, Latino/a, or Spanish origin What is your race?: A. White Do you need or want an interpreter to communicate with a doctor or health care staff?: 0. No   Patient's Response To:  Health Literacy and Transportation Is the patient able to respond to health literacy and transportation needs?: Yes Health Literacy - How often do you need to have someone help you when you read instructions, pamphlets, or other written material from your doctor or pharmacy?: Never In the past 12 months, has lack of transportation kept you from medical appointments or from getting medications?: No In the past 12 months, has lack of transportation kept you from meetings, work, or from getting things needed for daily living?: No   Home Assistive Devices / [place] Devices/Equipment: Radio producer (specify quad or straight) Home Equipment: Walker - 2 wheels, Cane - single point, [facility]   Prior Device Use: Indicate devices/aids used by the patient prior to current illness, exacerbation or injury? None of the above   Current Functional Level Cognition   Overall Cognitive Status: No family/caregiver present to determine baseline cognitive functioning Current Attention Level: Sustained Orientation Level: Oriented X4 Following Commands: Follows one step commands inconsistently Safety/Judgement: Decreased awareness of safety, Decreased awareness of deficits General Comments: impulsive at times due to pain    Extremity Assessment (includes Sensation/Coordination)   Upper Extremity Assessment: Generalized weakness RUE Deficits / Details: gen weakness, decreased FM and GMC;  assist to  stretch hand into extension with preference to keep digits flexed LUE Deficits / Details: edema and gen weakness, decreased coordiantion (RN reports IV inflitrated)  Lower Extremity Assessment: Defer to PT evaluation     ADLs   Overall ADL's : Needs assistance/impaired Eating/Feeding: Total assistance, Sitting Eating/Feeding Details (indicate cue type and reason): SLP recommending diet Grooming: Maximal assistance, Sitting Grooming Details (indicate cue type and reason): suction oral care at EOB with hand over hand assist to complete, coordinate task Upper Body Bathing: Moderate assistance Lower Body Bathing: Maximal assistance Upper Body Dressing : Moderate assistance Lower Body Dressing: Maximal assistance Toilet Transfer Details (indicate cue type and reason): deferred Functional mobility during ADLs: +2 for physical assistance, +2 for safety/equipment, Rolling walker (2 wheels), Maximal assistance General ADL Comments: Pt completed bed mobility, stood 2x from EOB, rest break incorporated, min A +2. Able to side step to left on first stand. Cues second to decreased attention span and a bit impulsive at times.     Mobility   Overal bed mobility: Needs Assistance Bed Mobility: Rolling, Sidelying to Sit Rolling: Min assist Sidelying to sit: Min assist Supine to sit: Mod assist Sit to supine: Mod assist, +2 for physical assistance, +2 for safety/equipment Sit to sidelying: Mod assist General bed mobility comments: assist to roll towars L and trunk elevation. Reinforced log roll technique     Transfers   Overall transfer level: Needs assistance Equipment used: Rolling walker (2 wheels) Transfers: Sit to/from Stand Sit to Stand: Min assist, +2 physical assistance, +2 safety/equipment, From elevated surface Stand pivot transfers: Mod assist, +2 physical assistance  Lateral/Scoot Transfers: Max assist, +2 physical assistance, +2 safety/equipment (more stand/scoot transfers up toward  [place]) General transfer comment: minA+2 to rise and steady. Bed partially elevated. Cues for hand placement to push up     Ambulation / Gait / Stairs / Wheelchair Mobility   Ambulation/Gait Ambulation/Gait assistance: Min assist, +2 physical assistance, +2 safety/equipment Gait Distance (Feet): 4 Feet Assistive device: Rolling walker (2 wheels) Gait Pattern/deviations: Step-through pattern, Decreased stride length, Scissoring, Trunk flexed, Narrow base of support General Gait Details: minA+2 for steadying and RW management for short ambulation towards recliner. Cues for close proximity to RW. Gait velocity: decreased     Posture / Balance Dynamic Sitting Balance Sitting balance - Comments: uses UE's for stability and to decrease pain. Balance Overall balance assessment: Needs assistance Sitting-balance support: Bilateral upper extremity supported, Feet supported Sitting balance-Leahy Scale: Fair Sitting balance - Comments: uses UE's for stability and to decrease pain. Standing balance support: Bilateral upper extremity supported, During functional activity Standing balance-Leahy Scale: Poor Standing balance comment: pt unable to come fully upright without significant truncal/pelvic support and boost and use of the RW     Special needs/care consideration SIZEWISE speciality bed 10 fr cortrak left nare 10/19                Signed        Show:Clear all [x] Written[x] Templated[] Copied   Added by: [x] Salley Slaughter, RN   [] Hover for details                                                                [place] Nurse wound follow up Patient receiving care in [facility] 3M09  Wound type: Evolving DTPI of the intergluteal cleft. Now unstageable with black base and painful to the patient.  Back wound is 99% healed. Left big toe wound is 95% healed. Drainage (amount, consistency, odor) Brown sanguinous drainage on the dressing.  Periwound: intact Dressing  procedure/placement/frequency: Clean the buttock area with no rinse cleanser. Pat dry and apply a nickel thick layer of Santyl over the blackened area, cover with a moistened saline gauze and secure with foam dressing. Carefully peel back foam dressing to apply santyl daily. May use sacral foam dressing up to 3 days unless soiled.   Monitor the wound area(s) for worsening of condition such as: Signs/symptoms of infection, increase in size, development of or worsening of odor, development of pain, or increased pain at the affected locations.   Notify the medical team if any of these develop.   Pressure Injury Prevention Bundle May use any that apply to this patient. Support surfaces (air mattress) chair cushion Kellie Simmering # [phone]) Heel offloading boots Kellie Simmering # [phone]) Turning and Positioning  Measures to reduce shear (draw sheet, knees up) Skin protection Products (Foam dressing) Moisture management products (Critic-Aid Barrier Cream (Purple top) Sween moisturizing lotion (Pink top in clean supply) Nutrition Management Protection for Medical Devices Routine Skin Assessment    Thank you for the consult. [place] nurse will not follow at this time.   Please re-consult the [facility] team if needed.   Cathlean Marseilles Tamala Julian, MSN, RN, CMSRN, AGCNS, [facility] Wound Treatment Associate Pager [phone]                  Previous Home Environment  Living Arrangements: Spouse/significant other  Lives With: Spouse Available Help at Discharge: Family, Available 24 hours/day Type of Home: House Home Layout: One level Home Access: Stairs to enter Entrance Stairs-Rails: None Entrance Stairs-Number of Steps: 2 Bathroom Shower/Tub: Optometrist: Yes How Accessible: Accessible via walker [place]: Yes Type of Home Care Services: Homehealth aide Additional Comments: interested in bench for tub   Discharge Living Setting Plans for Discharge Living  Setting: Patient's home, Lives with (comment) (wife) Type of Home at Discharge: House Discharge Home Layout: One level Discharge Home Access: Stairs to enter Entrance Stairs-Rails: None Entrance Stairs-Number of Steps: 2 Discharge Bathroom Shower/Tub: Tub/shower unit Discharge Bathroom Toilet: Standard Discharge Bathroom Accessibility: Yes How Accessible: Accessible via walker Does the patient have any problems obtaining your medications?: No   Social/Family/Support Systems Patient Roles: Spouse, Parent Contact Information: Junie Panning, wife Anticipated Caregiver: wife Anticipated Ambulance person Information: see contacts Ability/Limitations of Caregiver: none Caregiver Availability: 24/7 Discharge Plan Discussed with Primary Caregiver: Yes Is Caregiver In Agreement with Plan?: Yes Does Caregiver/Family have Issues with Lodging/Transportation while Pt is in Rehab?: No   Goals Patient/Family Goal for Rehab: supevision to min with PT and OT, supervision with SLP Expected length of stay: ELOS 10 to 12 days Pt/Family Agrees to Admission and willing to participate: Yes Program Orientation Provided & Reviewed with Pt/Caregiver Including Roles  & Responsibilities: Yes   Decrease burden of Care through IP rehab admission: n/a   Possible need for SNF placement upon discharge: not anticipated   Patient Condition: I have reviewed medical records from [facility], spoken with CM, and patient and spouse. I met with patient at the bedside for inpatient rehabilitation assessment.  Patient will benefit from ongoing PT, OT, and SLP, can actively participate in 3 hours of therapy a day 5 days of the week, and can make  measurable gains during the admission.  Patient will also benefit from the coordinated team approach during an Inpatient Acute Rehabilitation admission.  The patient will receive intensive therapy as well as Rehabilitation physician, nursing, social worker, and care management  interventions.  Due to bladder management, bowel management, safety, skin/wound care, disease management, medication administration, pain management, and patient education the patient requires 24 hour a day rehabilitation nursing.  The patient is currently min to mod assist overall with mobility and basic ADLs.  Discharge setting and therapy post discharge at home with home health is anticipated.  Patient has agreed to participate in the Acute Inpatient Rehabilitation Program and will admit Sunday 11/6 when bed is available..   Preadmission Screen Completed By:  Cleatrice Burke, 08/28/2021 4:13 PM ______________________________________________________________________   Discussed status with Dr. Letta Pate on 08/28/2021 at 1600 and received approval for admission 11/6 when bed is available.   Admission Coordinator:  Cleatrice Burke, RN, time 1600 Date 08/28/2021    Assessment/Plan: Diagnosis:THoracic myelopathy Does the need for close, 24 hr/day Medical supervision in concert with the patient's rehab needs make it unreasonable for this patient to be served in a less intensive setting? Yes Co-Morbidities requiring supervision/potential complications: Diabetes with neuropathy, retinopathy,s/p PNA and VDRF Due to bladder management, bowel management, safety, skin/wound care, disease management, medication administration, pain management, and patient education, does the patient require 24 hr/day rehab nursing? Yes Does the patient require coordinated care of a physician, rehab nurse, PT, OT, and SLP to address physical and functional deficits in the context of the above medical diagnosis(es)? Yes Addressing deficits in the following areas: balance, endurance, locomotion, strength, transferring, bowel/bladder control, bathing, dressing, feeding, grooming, toileting, and psychosocial support Can the patient actively participate in an intensive therapy program of at least 3 hrs of therapy 5 days a  week? Yes The potential for patient to make measurable gains while on inpatient rehab is good Anticipated functional outcomes upon discharge from inpatient rehab: supervision and min assist PT, supervision and min assist OT, n/a SLP Estimated rehab length of stay to reach the above functional goals is: 10-12d Anticipated discharge destination: Home 10. Overall Rehab/Functional Prognosis: good     MD Signature: Charlett Blake M.D. [facility] Fellow Am Acad of Phys Med and Rehab Diplomate Am Board of Electrodiagnostic Med Fellow Am Board of Interventional Pain            Revision History                     Note Details  Author Charlett Blake, MD File Time 08/28/2021  4:50 PM  Author Type Physician Status Signed  Last Editor Charlett Blake, MD Service Physical Medicine and [facility] # [id number] Admit Date 08/30/2021

## 2021-08-30 NOTE — H&P (Signed)
Physical Medicine and Rehabilitation Admission H&P     CC: Functional deficits   HPI: Jose [name] is a 45 year old male with history of T2DM with neuropathy and retinopathy, CKD, Macular degeneration, GAD with 6 week history of chest pain radiating to bilateral shoulders who admitted 9/28- 10/10 for work up. He was found to have lytic lesions T5,T6 and sternum with pathologic fracture T6 as well as MSSA bacteremia and was discharged to home on IV antibiotics. He was readmitted on 10/14 with worsening of pain and found to have increased in paraspinal phelegmon with new epidural phelgmon and interval T5 pathologic fracture with severe left eccentric canal stenosis and overall finding likely secondary to progressive diskitis/osteomyelitis.   He was taken to OR for T4 and T5 laminectomy with left T5 pediculotomy for decompression of spinal cord by Dr. Kathyrn Sheriff. Work up for MM and bone biopsy negative for malignancy.   Hospital course significant for abdominal pain with distension with coffee ground emesis 10/16 followed by drop in Hgb 6.2 with hypoxia and confusion. He was found to have multifocal PNA due to aspiration and PCCM consulted. Patient required BIPAP as well as 2 units PRBC.  He was evaluated by GI and underwent UGI by Dr. Alessandra Bevels on 10/17 revealing severe esophagitis with black discoloration of mucosa, large amount of retained coffee ground fluid in stomach which was suctioned as well as few non bleeding superficial duodenal ulcers without stigmata of bleeding.  He was started on IV protonix and transitioned to po BID with recommendations to continue for 2 months followed by daily dose.     CT head done negative for acute changes. He was started on fluconazole for candida esophagitis and CT chest showed possible enlargening effusions and abscesses. He was treated with stress dose steroids for ARDS and underwent thoracentesis of 250 cc of yellow clear fluid from left pleural space on 10/21  and tolerated extubation by 10/24. Nephrology following for acute on chronic renal failure with supportive care. He continued to have issues with confusion as well as recurrent respiratory distress with drop in Hgb to 5.8 on 10/26 and was treated with 2 units PRBC and monitored with serial H/H. He had large melanotic stools on 10/27 with small amount of bloody emesis. He was treated with protonix drip and supportive care.    V/Q scan done 10/27 showing large perfusion defect in LUL not typical of PE but unable to exclude PE and CT chest not done due to renal issues. BLE dopplers negative for DVT.  He developed recurrent tarry stools and underwent repeat EGD showing improvement in esophagitis with bilious fluid in stomach and no gastric or duodenal ulcers-->no further GI work up planned and GI signed off.  Diet resumed but he continues on tube feeds due to poor po intake with episodic hypoglycemia. CBC showed H/H to be stable, bloody stools have resolved and SCr is steadily improving. He has completed course of diflucan and transitioned to ancef with end date of 12/01. He continues to be limited by back pain and weakness affecting ADLs and mobility. CIR recommended due to functional decline.      Poor sitting tolerance ~55mn due to pain.  Review of Systems  Constitutional:  Negative for chills and fever.  HENT:  Negative for hearing loss and tinnitus.   Eyes:  Negative for blurred vision and double vision.  Respiratory:  Negative for cough and shortness of breath.   Cardiovascular:  Negative for chest pain and palpitations.  Gastrointestinal:  Positive for nausea and vomiting.  Genitourinary:  Positive for frequency. Negative for dysuria and urgency.  Skin:  Negative for rash.  Neurological:  Positive for dizziness, sensory change (bilateral hands and feet) and weakness. Negative for headaches.  Psychiatric/Behavioral:  The patient has insomnia.           Past Medical History:  Diagnosis Date    GAD (generalized anxiety disorder)     Hyperlipidemia     Macular degeneration, bilateral     Retinopathy     Type II diabetes mellitus with complication, uncontrolled      retinopathy, neuropathy, microalbuminuria           Past Surgical History:  Procedure Laterality Date   APPENDECTOMY       BIOPSY   08/10/2021    Procedure: BIOPSY;  Surgeon: Otis Brace, MD;  Location: [facility];  Service: Gastroenterology;;   BUBBLE STUDY   07/29/2021    Procedure: BUBBLE STUDY;  Surgeon: Jerline Pain, MD;  Location: [facility];  Service: Cardiovascular;;   ESOPHAGOGASTRODUODENOSCOPY (EGD) WITH PROPOFOL N/A 08/10/2021    Procedure: ESOPHAGOGASTRODUODENOSCOPY (EGD) WITH PROPOFOL;  Surgeon: Otis Brace, MD;  Location: [facility];  Service: Gastroenterology;  Laterality: N/A;   ESOPHAGOGASTRODUODENOSCOPY (EGD) WITH PROPOFOL N/A 08/24/2021    Procedure: ESOPHAGOGASTRODUODENOSCOPY (EGD) WITH PROPOFOL;  Surgeon: Otis Brace, MD;  Location: [facility];  Service: Gastroenterology;  Laterality: N/A;   HERNIA REPAIR       IR FLUORO GUIDED NEEDLE PLC ASPIRATION/INJECTION LOC   07/28/2021   LUMBAR LAMINECTOMY/DECOMPRESSION MICRODISCECTOMY N/A 08/07/2021    Procedure: THORACIC FOUR - THORACIC FIVE LAMINECTOMY/DECOMPRESSION OF SPINAL CORD, DEBRIDEMENT OF ABSCESS, MICRODISCECTOMY, INTRAOPERATIVE ULTRASOUND;  Surgeon: Consuella Lose, MD;  Location: [facility];  Service: Neurosurgery;  Laterality: N/A;   TEE WITHOUT CARDIOVERSION N/A 07/29/2021    Procedure: TRANSESOPHAGEAL ECHOCARDIOGRAM (TEE);  Surgeon: Jerline Pain, MD;  Location: [facility];  Service: Cardiovascular;  Laterality: N/A;   TRIGGER FINGER RELEASE Right 10/25/2019    Procedure: RIGHT INDEX FINGER RELEASE TRIGGER FINGER/A-1 PULLEY;  Surgeon: Daryll Brod, MD;  Location: [facility];  Service: Orthopedics;  Laterality: Right;  IV REGIONAL FOREARM BLOCK           Family History  Problem Relation Age of Onset    Diabetes Mother     Hyperlipidemia Mother     Stroke Mother     Diabetes Father     Hyperlipidemia Brother     Stroke Brother     ADD / ADHD Brother     ADD / ADHD Son      Social History:  reports that he quit smoking about 13 months ago. His smoking use included cigarettes. He has a 20.00 pack-year smoking history. He has never used smokeless tobacco. He reports that he does not currently use alcohol. He reports that he does not use drugs. Allergies:       Allergies  Allergen Reactions   Cranberry Itching   Hm Lidocaine Patch [Lidocaine] Dermatitis      Blisters skin    Melatonin Other (See Comments)      nightmares          Medications Prior to Admission  Medication Sig Dispense Refill   acetaminophen (TYLENOL) 500 MG tablet Take 500 mg by mouth every 6 (six) hours as needed for mild pain, fever or headache.       albuterol (VENTOLIN HFA) 108 (90 Base) MCG/ACT inhaler Inhale 1 puff into the lungs every 4 (  four) hours as needed for wheezing or shortness of breath.       ALPRAZolam (XANAX) 0.25 MG tablet Take 0.25 mg by mouth 2 (two) times daily as needed for anxiety.       amLODipine (NORVASC) 10 MG tablet Take 1 tablet (10 mg total) by mouth daily. 30 tablet 0   Ascorbic Acid (VITAMIN C PO) Take 1 tablet by mouth daily.       atorvastatin (LIPITOR) 40 MG tablet Take 1 tablet (40 mg total) by mouth daily. 90 tablet 3   ceFAZolin (ANCEF) IVPB Inject 2 g into the vein every 8 (eight) hours. Indication:  Osteomyelitis  First Dose: No Last Day of Therapy:  09/04/2022  Labs - Once weekly:  CBC/D and BMP, Labs - Every other week:  ESR and CRP Method of administration: IV Push Method of administration may be changed at the discretion of home infusion pharmacist based upon assessment of the patient and/or caregiver's ability to self-administer the medication ordered. 105 Units 0   chlorproMAZINE (THORAZINE) 25 MG tablet Take 1 tablet (25 mg total) by mouth 3 (three) times daily as  needed for hiccoughs. 60 tablet 0   collagenase (SANTYL) ointment Apply topically daily. 15 g 0   cyclobenzaprine (FLEXERIL) 10 MG tablet Take 1 tablet (10 mg total) by mouth 3 (three) times daily. 30 tablet 0   Dulaglutide (TRULICITY) 3 PQ/9.8YM SOPN Inject 3 mg as directed once a week. (Patient taking differently: Inject 3 mg as directed once a week. thursday) 6 mL 3   feeding supplement, GLUCERNA SHAKE, (GLUCERNA SHAKE) LIQD Take 237 mLs by mouth 3 (three) times daily between meals. 90 mL 0   gabapentin (NEURONTIN) 300 MG capsule TAKE 1 TO 2 CAPSULES(300 TO 600 MG) BY MOUTH AT BEDTIME (Patient taking differently: Take 300 mg by mouth 2 (two) times daily.) 180 capsule 1   glimepiride (AMARYL) 1 MG tablet Take 1 tablet (1 mg total) by mouth daily with breakfast. 90 tablet 3   hydrALAZINE (APRESOLINE) 50 MG tablet Take 1 tablet (50 mg total) by mouth 3 (three) times daily. 90 tablet 0   lactulose (CHRONULAC) 10 GM/15ML solution Take 30 mLs (20 g total) by mouth 2 (two) times daily. 236 mL 0   Multiple Vitamin (MULTIVITAMIN ADULT PO) Take 1 tablet by mouth daily.       mupirocin ointment (BACTROBAN) 2 % Apply 1 application topically 2 (two) times daily. 22 g 0   Oxycodone HCl 10 MG TABS Take 10 mg by mouth every 3 (three) hours as needed for pain.       senna-docusate (SENOKOT-S) 8.6-50 MG tablet Take 1 tablet by mouth 2 (two) times daily. 30 tablet 0   tadalafil (CIALIS) 20 MG tablet TK 1 T PO  PO QD PRN FOR ERECTILE DYSFUNCTION (Patient taking differently: Take 20 mg by mouth daily as needed for erectile dysfunction.) 10 tablet 11   tobramycin (TOBREX) 0.3 % ophthalmic solution Place 1 drop into both eyes See admin instructions. Begin 1 day prior to treatment and continue the day of treatment and for one full day after treatment.   5   traZODone (DESYREL) 100 MG tablet TAKE 1 TABLET(100 MG) BY MOUTH AT BEDTIME AS NEEDED FOR SLEEP (Patient taking differently: Take 100 mg by mouth at bedtime.) 90  tablet 0   VITAMIN D PO Take 1 tablet by mouth daily.       DULoxetine (CYMBALTA) 60 MG capsule TAKE 1 CAPSULE(60 MG) BY MOUTH DAILY (  Patient not taking: No sig reported) 30 capsule 2   Ranibizumab (LUCENTIS) 0.3 MG/0.05ML SOLN 1 Dose by Intravitreal route every 3 (three) months.          Drug Regimen Review  Drug regimen was reviewed and remains appropriate with no significant issues identified   Home: Home Living Family/patient expects to be discharged to:: Private residence Living Arrangements: Spouse/significant other Available Help at Discharge: Family, Available 24 hours/day Type of Home: House Home Access: Stairs to enter [employer] of Steps: 2 Entrance Stairs-Rails: None Home Layout: One level Bathroom Shower/Tub: Chiropodist: Standard Home Equipment: Environmental consultant - 2 wheels, Cane - single point, [facility] Additional Comments: interested in bench for tub   Functional History: Prior Function (Read Only) Level of Independence: Independent (Read Only) Comments: 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 Pt quickly returning to supine at end of session, OT assisting for safety and to control descent. Educated on logroll technique its benefit. Transfers Overall transfer level: Needs assistance Equipment used: Rolling walker (2 wheels) Transfers: Sit to/from Stand Sit to Stand: Min assist, +2 physical assistance, +2 safety/equipment, From elevated surface Stand pivot transfers: Mod assist, +2 physical assistance  Lateral/Scoot Transfers:  Max assist, +2 physical assistance, +2 safety/equipment (more stand/scoot transfers up toward [facility]) General transfer comment: EOB partially elevated. Cues for technique with rw. Assist to steady and powerup. Utilized rw and +2 physical assist. Ambulation/Gait General Gait Details: unable   ADL: ADL Overall ADL's : Needs assistance/impaired Eating/Feeding: Total assistance, Sitting Eating/Feeding Details (indicate cue type and reason): SLP recommending diet Grooming: Maximal assistance, Sitting Grooming Details (indicate cue type and reason): suction oral care at EOB with hand over hand assist to complete, coordinate task Upper Body Bathing: Moderate assistance Lower Body Bathing: Maximal assistance Upper Body Dressing : Moderate assistance Lower Body Dressing: Maximal assistance Toilet Transfer Details (indicate cue type and reason): deferred Functional mobility during ADLs: +2 for physical assistance, +2 for safety/equipment, Rolling walker (2 wheels), Maximal assistance General ADL Comments: Pt completed bed mobility, stood 2x from EOB, rest break incorporated, min A +2. Able to side step to left on first stand. Cues second to decreased attention span and a bit impulsive at times.   Cognition: Cognition Overall Cognitive Status: No family/caregiver present to determine baseline cognitive functioning Orientation Level: Oriented X4 Cognition Arousal/Alertness: Awake/alert Behavior During Therapy: Restless Overall Cognitive Status: No family/caregiver present to determine baseline cognitive functioning Area of Impairment: Attention, Following commands, Memory, Safety/judgement, Awareness, Problem solving, Orientation Orientation Level: Disoriented to, Time Current Attention Level: Sustained Memory: Decreased short-term memory, Decreased recall of precautions Following Commands: Follows one step commands inconsistently Safety/Judgement: Decreased awareness of safety, Decreased awareness  of deficits Awareness: Emergent Problem Solving: Slow processing General Comments: Can be impulsive during transfers. Internally distracted. Pleasant demeanor.     Blood pressure 140/75, pulse 73, temperature 97.6 F (36.4 C), temperature source Axillary, resp. rate 14, height _0  (1.803 m), weight 97.1 kg, SpO2 96 %. Physical Exam Vitals and nursing note reviewed.  Constitutional:      Appearance: Normal appearance.  Neurological:  Mental Status: He is alert and oriented to person, place, and time.     Comments: Oriented and appropriate but occasional inappropriate comments noted--question disorientation.     General: No acute distress Mood and affect are appropriate Heart: Regular rate and rhythm no rubs murmurs or extra sounds Lungs: Clear to auscultation, breathing unlabored, no rales or wheezes Abdomen: Positive bowel sounds, soft nontender to palpation, nondistended Extremities: No clubbing, cyanosis, or edema Skin: No evidence of breakdown,incision thoracic CDI Neurologic: Cranial nerves II through XII intact, motor strength is 5/5 in bilateral deltoid, bicep, tricep, grip, 4-/5 hip flexor, knee extensors, 3/5 ankle dorsiflexor and plantar flexor Sensory exam reduced LT and Proprioception bilateral feet  Musculoskeletal: no pain with  range of motion in all 4 extremities. No joint swelling    Lab Results Last 48 Hours        Results for orders placed or performed during the hospital encounter of 08/06/21 (from the past 48 hour(s))  Glucose, capillary     Status: Abnormal    Collection Time: 08/26/21  3:33 PM  Result Value Ref Range    Glucose-Capillary 189 (H) 70 - 99 mg/dL      Comment: Glucose reference range applies only to samples taken after fasting for at least 8 hours.  Glucose, capillary     Status: Abnormal    Collection Time: 08/26/21  7:41 PM  Result Value Ref Range    Glucose-Capillary 125 (H) 70 - 99 mg/dL      Comment: Glucose reference range applies  only to samples taken after fasting for at least 8 hours.  Glucose, capillary     Status: Abnormal    Collection Time: 08/26/21 11:07 PM  Result Value Ref Range    Glucose-Capillary 105 (H) 70 - 99 mg/dL      Comment: Glucose reference range applies only to samples taken after fasting for at least 8 hours.  CBC with Differential/Platelet     Status: Abnormal    Collection Time: 08/27/21  3:20 AM  Result Value Ref Range    WBC 10.9 (H) 4.0 - 10.5 K/uL    RBC 3.10 (L) 4.22 - 5.81 MIL/uL    Hemoglobin 8.9 (L) 13.0 - 17.0 g/dL    HCT 29.0 (L) 39.0 - 52.0 %    MCV 93.5 80.0 - 100.0 fL    MCH 28.7 26.0 - 34.0 pg    MCHC 30.7 30.0 - 36.0 g/dL    RDW 18.3 (H) 11.5 - 15.5 %    Platelets 342 150 - 400 K/uL    nRBC 0.0 0.0 - 0.2 %    Neutrophils Relative % 71 %    Neutro Abs 7.7 1.7 - 7.7 K/uL    Lymphocytes Relative 11 %    Lymphs Abs 1.1 0.7 - 4.0 K/uL    Monocytes Relative 11 %    Monocytes Absolute 1.2 (H) 0.1 - 1.0 K/uL    Eosinophils Relative 6 %    Eosinophils Absolute 0.7 (H) 0.0 - 0.5 K/uL    Basophils Relative 0 %    Basophils Absolute 0.0 0.0 - 0.1 K/uL    Immature Granulocytes 1 %    Abs Immature Granulocytes 0.05 0.00 - 0.07 K/uL      Comment: Performed at [facility], [address] [place], [place] [address]  Basic metabolic panel     Status: Abnormal    Collection Time: 08/27/21  3:20 AM  Result Value Ref Range    Sodium  144 135 - 145 mmol/L    Potassium 4.7 3.5 - 5.1 mmol/L    Chloride 116 (H) 98 - 111 mmol/L    CO2 20 (L) 22 - 32 mmol/L    Glucose, Bld 173 (H) 70 - 99 mg/dL      Comment: Glucose reference range applies only to samples taken after fasting for at least 8 hours.    BUN 49 (H) 6 - 20 mg/dL    Creatinine, Ser 1.64 (H) 0.61 - 1.24 mg/dL    Calcium 8.4 (L) 8.9 - 10.3 mg/dL    GFR, Estimated 52 (L) >60 mL/min      Comment: (NOTE) Calculated using the CKD-EPI Creatinine Equation (2021)      Anion gap 8 5 - 15      Comment: Performed at [facility] [address], [place], [place] [address]  Magnesium     Status: None    Collection Time: 08/27/21  3:20 AM  Result Value Ref Range    Magnesium 2.1 1.7 - 2.4 mg/dL      Comment: Performed at [facility] [address], [place], [place] [address]  Phosphorus     Status: None    Collection Time: 08/27/21  3:20 AM  Result Value Ref Range    Phosphorus 3.3 2.5 - 4.6 mg/dL      Comment: Performed at [facility] [address], [place], [place] [address]  Glucose, capillary     Status: Abnormal    Collection Time: 08/27/21  3:40 AM  Result Value Ref Range    Glucose-Capillary 180 (H) 70 - 99 mg/dL      Comment: Glucose reference range applies only to samples taken after fasting for at least 8 hours.  Glucose, capillary     Status: Abnormal    Collection Time: 08/27/21  8:02 AM  Result Value Ref Range    Glucose-Capillary 211 (H) 70 - 99 mg/dL      Comment: Glucose reference range applies only to samples taken after fasting for at least 8 hours.  Glucose, capillary     Status: Abnormal    Collection Time: 08/27/21 11:30 AM  Result Value Ref Range    Glucose-Capillary 183 (H) 70 - 99 mg/dL      Comment: Glucose reference range applies only to samples taken after fasting for at least 8 hours.  Glucose, capillary     Status: Abnormal    Collection Time: 08/27/21  3:45 PM  Result Value Ref Range    Glucose-Capillary 39 (LL) 70 - 99 mg/dL      Comment: Glucose reference range applies only to samples taken after fasting for at least 8 hours.    Comment 1 Notify RN    Glucose, capillary     Status: Abnormal    Collection Time: 08/27/21  4:08 PM  Result Value Ref Range    Glucose-Capillary 47 (L) 70 - 99 mg/dL      Comment: Glucose reference range applies only to samples taken after fasting for at least 8 hours.  Glucose, capillary     Status: Abnormal    Collection Time: 08/27/21  4:51 PM  Result Value Ref Range    Glucose-Capillary 59 (L) 70 - 99 mg/dL       Comment: Glucose reference range applies only to samples taken after fasting for at least 8 hours.  Glucose, capillary     Status: None    Collection Time: 08/27/21  5:25 PM  Result Value Ref Range    Glucose-Capillary 95 70 - 99 mg/dL      Comment: Glucose reference range applies only to samples taken after fasting for at least 8 hours.  Glucose, capillary     Status: Abnormal    Collection Time: 08/27/21  7:36 PM  Result Value Ref Range    Glucose-Capillary 166 (H) 70 - 99 mg/dL      Comment: Glucose reference range applies only to samples taken after fasting for at least 8 hours.  Glucose, capillary     Status: Abnormal    Collection Time: 08/27/21 11:27 PM  Result Value Ref Range    Glucose-Capillary 286 (H) 70 - 99 mg/dL      Comment: Glucose reference range applies only to samples taken after fasting for at least 8 hours.  Glucose, capillary     Status: Abnormal    Collection Time: 08/28/21  3:32 AM  Result Value Ref Range    Glucose-Capillary 337 (H) 70 - 99 mg/dL      Comment: Glucose reference range applies only to samples taken after fasting for at least 8 hours.    Comment 1 Notify RN    CBC with Differential/Platelet     Status: Abnormal    Collection Time: 08/28/21  3:46 AM  Result Value Ref Range    WBC 8.4 4.0 - 10.5 K/uL    RBC 3.10 (L) 4.22 - 5.81 MIL/uL    Hemoglobin 8.8 (L) 13.0 - 17.0 g/dL    HCT 29.4 (L) 39.0 - 52.0 %    MCV 94.8 80.0 - 100.0 fL    MCH 28.4 26.0 - 34.0 pg    MCHC 29.9 (L) 30.0 - 36.0 g/dL    RDW 18.6 (H) 11.5 - 15.5 %    Platelets 323 150 - 400 K/uL    nRBC 0.0 0.0 - 0.2 %    Neutrophils Relative % 66 %    Neutro Abs 5.6 1.7 - 7.7 K/uL    Lymphocytes Relative 14 %    Lymphs Abs 1.2 0.7 - 4.0 K/uL    Monocytes Relative 12 %    Monocytes Absolute 1.0 0.1 - 1.0 K/uL    Eosinophils Relative 6 %    Eosinophils Absolute 0.5 0.0 - 0.5 K/uL    Basophils Relative 1 %    Basophils Absolute 0.1 0.0 - 0.1 K/uL    Immature Granulocytes 1 %    Abs  Immature Granulocytes 0.07 0.00 - 0.07 K/uL      Comment: Performed at [facility], [address] [place], [place] [address]  Basic metabolic panel     Status: Abnormal    Collection Time: 08/28/21  3:46 AM  Result Value Ref Range    Sodium 141 135 - 145 mmol/L    Potassium 4.3 3.5 - 5.1 mmol/L    Chloride 114 (H) 98 - 111 mmol/L    CO2 22 22 - 32 mmol/L    Glucose, Bld 327 (H) 70 - 99 mg/dL      Comment: Glucose reference range applies only to samples taken after fasting for at least 8 hours.    BUN 47 (H) 6 - 20 mg/dL    Creatinine, Ser 1.52 (H) 0.61 - 1.24 mg/dL    Calcium 8.4 (L) 8.9 - 10.3 mg/dL    GFR, Estimated 57 (L) >60 mL/min      Comment: (NOTE) Calculated using the CKD-EPI Creatinine Equation (2021)  Anion gap 5 5 - 15      Comment: Performed at [facility] [address] [place], [place] [address]  Glucose, capillary     Status: Abnormal    Collection Time: 08/28/21  7:51 AM  Result Value Ref Range    Glucose-Capillary 247 (H) 70 - 99 mg/dL      Comment: Glucose reference range applies only to samples taken after fasting for at least 8 hours.  Glucose, capillary     Status: Abnormal    Collection Time: 08/28/21 11:17 AM  Result Value Ref Range    Glucose-Capillary 255 (H) 70 - 99 mg/dL      Comment: Glucose reference range applies only to samples taken after fasting for at least 8 hours.       Imaging Results (Last 48 hours)  DG Chest 2 View   Result Date: 08/27/2021 CLINICAL DATA:  Short of breath EXAM: CHEST - 2 VIEW COMPARISON:  08/23/2021 FINDINGS: Right arm PICC tip in the SVC unchanged. Feeding tube in the gastric antrum. Hypoventilation with decreased lung volumes. Small left pleural effusion. No pneumothorax Mild right lower lobe atelectasis unchanged. IMPRESSION: Hypoventilation. Progression of airspace disease on the left likely due to atelectasis and/or infiltrate. Electronically Signed   By: Franchot Gallo M.D.   On: 08/27/2021 09:41              Medical Problem List and Plan: 1.  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  Antithrombotics: -DVT/anticoagulation:  N/A             -antiplatelet therapy: none 3. Pain Management: Oxycodone prn.  4. Mood: LCSW to follow for evaluation and support.              -antipsychotic agents: N/A 5. Neuropsych: This patient is intermittently capable of making decisions on his own behalf. 6. Skin/Wound Care: Air mattress for pressure relief measures.  7. Fluids/Electrolytes/Nutrition: Monitor I/O. Check lytes in am.  8. MSSA bacteremia: On ancef with end date 12/01 9. UGIB/melena: Has received 11 units PRBC during admission --Continue protonix BID w/ carafate ac/hs.  --Monitor for signs of bleeding. 10. T2DM: Po intake poor --continue TF at nights. Continue insulin gargline 12 units at nights --monitor BS every 4 hours with SSI 11. HTN: Monitor BP TID--continue Norvasc and metoprolol for control.  12. ABLA: On aranesp weekly for supplement.        Bary Leriche, PA-C "I have personally performed a face to face diagnostic evaluation of this patient.  Additionally, I have reviewed and concur with the physician assistant's documentation above." Charlett Blake M.D. [facility] Fellow Am [employer] of [employer] Re[employer] Diplomate Am Board of Electrodiagnostic Med Fellow Am Board of Interventional Pain 08/30/2021

## 2021-08-31 ENCOUNTER — Inpatient Hospital Stay (HOSPITAL_COMMUNITY): Payer: 59

## 2021-08-31 DIAGNOSIS — R5381 Other malaise: Secondary | ICD-10-CM | POA: Diagnosis not present

## 2021-08-31 DIAGNOSIS — M8628 Subacute osteomyelitis, other site: Secondary | ICD-10-CM

## 2021-08-31 DIAGNOSIS — R609 Edema, unspecified: Secondary | ICD-10-CM

## 2021-08-31 LAB — CBC WITH DIFFERENTIAL/PLATELET
Abs Immature Granulocytes: 0.07 10*3/uL (ref 0.00–0.07)
Abs Immature Granulocytes: 0.05 10*3/uL (ref 0.00–0.07)
Basophils Absolute: 0.1 10*3/uL (ref 0.0–0.1)
Basophils Absolute: 0.1 10*3/uL (ref 0.0–0.1)
Basophils Relative: 1 %
Basophils Relative: 1 %
Eosinophils Absolute: 0.6 10*3/uL — ABNORMAL HIGH (ref 0.0–0.5)
Eosinophils Absolute: 0.6 10*3/uL — ABNORMAL HIGH (ref 0.0–0.5)
Eosinophils Relative: 8 %
Eosinophils Relative: 6 %
HCT: 27.3 % — ABNORMAL LOW (ref 39.0–52.0)
HCT: 28.1 % — ABNORMAL LOW (ref 39.0–52.0)
Hemoglobin: 8.4 g/dL — ABNORMAL LOW (ref 13.0–17.0)
Hemoglobin: 8.6 g/dL — ABNORMAL LOW (ref 13.0–17.0)
Immature Granulocytes: 1 %
Immature Granulocytes: 1 %
Lymphocytes Relative: 18 %
Lymphocytes Relative: 13 %
Lymphs Abs: 1.4 10*3/uL (ref 0.7–4.0)
Lymphs Abs: 1.2 10*3/uL (ref 0.7–4.0)
MCH: 28.3 pg (ref 26.0–34.0)
MCH: 28 pg (ref 26.0–34.0)
MCHC: 30.8 g/dL (ref 30.0–36.0)
MCHC: 30.6 g/dL (ref 30.0–36.0)
MCV: 91.9 fL (ref 80.0–100.0)
MCV: 91.5 fL (ref 80.0–100.0)
Monocytes Absolute: 1 10*3/uL (ref 0.1–1.0)
Monocytes Absolute: 1.1 10*3/uL — ABNORMAL HIGH (ref 0.1–1.0)
Monocytes Relative: 13 %
Monocytes Relative: 12 %
Neutro Abs: 4.6 10*3/uL (ref 1.7–7.7)
Neutro Abs: 6.2 10*3/uL (ref 1.7–7.7)
Neutrophils Relative %: 59 %
Neutrophils Relative %: 67 %
Platelets: 339 10*3/uL (ref 150–400)
Platelets: 357 10*3/uL (ref 150–400)
RBC: 2.97 MIL/uL — ABNORMAL LOW (ref 4.22–5.81)
RBC: 3.07 MIL/uL — ABNORMAL LOW (ref 4.22–5.81)
RDW: 18.9 % — ABNORMAL HIGH (ref 11.5–15.5)
RDW: 18.9 % — ABNORMAL HIGH (ref 11.5–15.5)
WBC: 7.7 10*3/uL (ref 4.0–10.5)
WBC: 9.2 10*3/uL (ref 4.0–10.5)
nRBC: 0 % (ref 0.0–0.2)
nRBC: 0 % (ref 0.0–0.2)

## 2021-08-31 LAB — GLUCOSE, CAPILLARY
Glucose-Capillary: 111 mg/dL — ABNORMAL HIGH (ref 70–99)
Glucose-Capillary: 90 mg/dL (ref 70–99)
Glucose-Capillary: 187 mg/dL — ABNORMAL HIGH (ref 70–99)
Glucose-Capillary: 171 mg/dL — ABNORMAL HIGH (ref 70–99)
Glucose-Capillary: 193 mg/dL — ABNORMAL HIGH (ref 70–99)

## 2021-08-31 LAB — BASIC METABOLIC PANEL
Anion gap: 8 (ref 5–15)
BUN: 37 mg/dL — ABNORMAL HIGH (ref 6–20)
CO2: 20 mmol/L — ABNORMAL LOW (ref 22–32)
Calcium: 8.4 mg/dL — ABNORMAL LOW (ref 8.9–10.3)
Chloride: 108 mmol/L (ref 98–111)
Creatinine, Ser: 1.34 mg/dL — ABNORMAL HIGH (ref 0.61–1.24)
GFR, Estimated: >60 mL/min (ref >60)
Glucose, Bld: 108 mg/dL — ABNORMAL HIGH (ref 70–99)
Potassium: 3.5 mmol/L (ref 3.5–5.1)
Sodium: 136 mmol/L (ref 135–145)

## 2021-08-31 LAB — COMPREHENSIVE METABOLIC PANEL
ALT: 13 U/L (ref 0–44)
AST: 30 U/L (ref 15–41)
Albumin: 1.7 g/dL — ABNORMAL LOW (ref 3.5–5.0)
Alkaline Phosphatase: 958 U/L — ABNORMAL HIGH (ref 38–126)
Anion gap: 8 (ref 5–15)
BUN: 35 mg/dL — ABNORMAL HIGH (ref 6–20)
CO2: 20 mmol/L — ABNORMAL LOW (ref 22–32)
Calcium: 8.2 mg/dL — ABNORMAL LOW (ref 8.9–10.3)
Chloride: 108 mmol/L (ref 98–111)
Creatinine, Ser: 1.35 mg/dL — ABNORMAL HIGH (ref 0.61–1.24)
GFR, Estimated: >60 mL/min (ref >60)
Glucose, Bld: 214 mg/dL — ABNORMAL HIGH (ref 70–99)
Potassium: 3.6 mmol/L (ref 3.5–5.1)
Sodium: 136 mmol/L (ref 135–145)
Total Bilirubin: 2 mg/dL — ABNORMAL HIGH (ref 0.3–1.2)
Total Protein: 6.1 g/dL — ABNORMAL LOW (ref 6.5–8.1)

## 2021-08-31 IMAGING — DX DG CHEST 2V
2 series · 2 of 2 positions shown · non-contrast
Comparison: CHEST X-RAY:
COMPARISON: CHEST X-RAY
[DATE], CT chest [DATE]

CLINICAL DATA: Constipation, follow-up exam Hx diabetes Patient
reports not having a bowel movement in the past 2 days

EXAM:
ABDOMEN - 1 VIEW; CHEST - 2 VIEW

[chest lat]
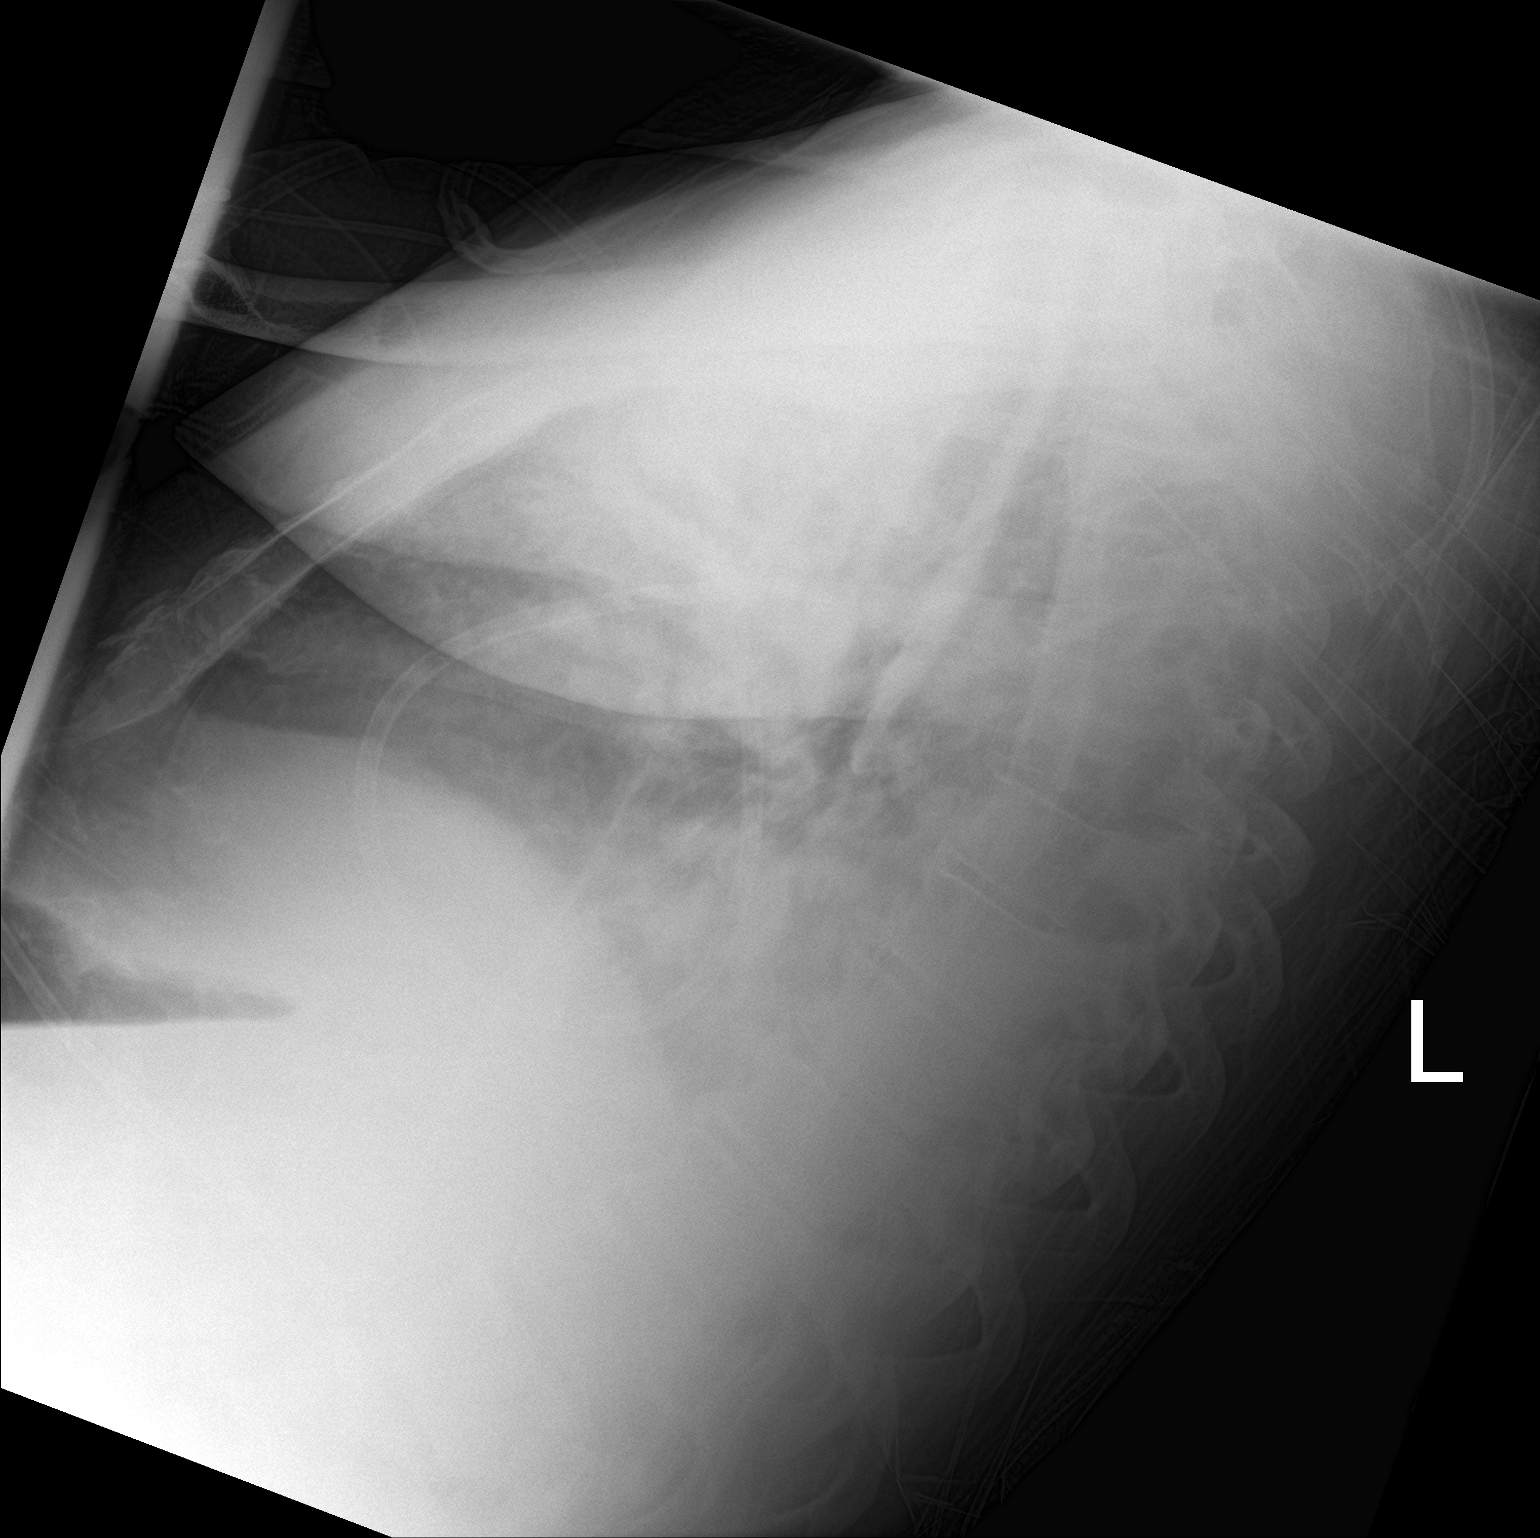

[chest ap]
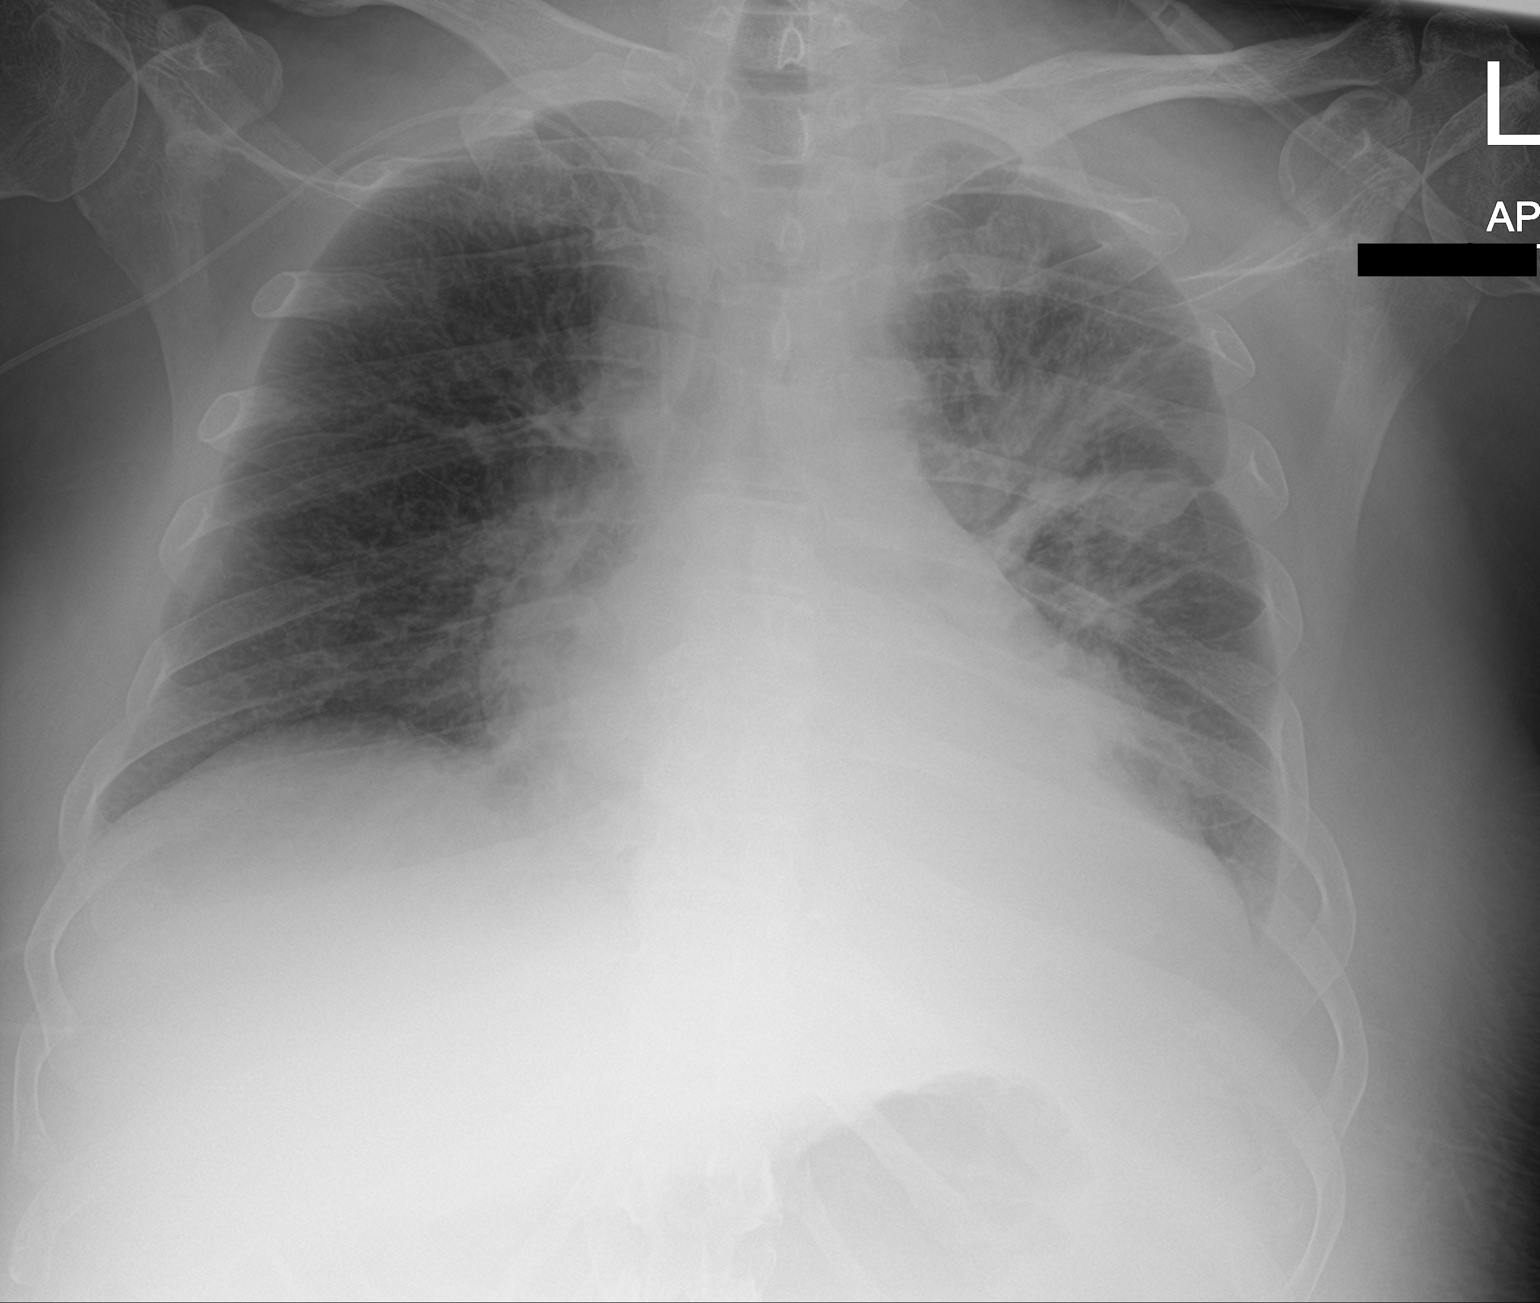

[2 of 2 positions shown; findings below may reference images not displayed]

FINDINGS: Right PICC with tip overlying distal superior vena cava. The heart
and mediastinal contours are within normal limits. Prominence of the
hilar vasculature.

Airspace opacity of the left upper left lower lobes. Increased
interstitial markings. No pleural effusion. No pneumothorax.

The bowel gas pattern is normal. No radio-opaque calculi or other
significant radiographic abnormality are seen. Vascular
calcifications within the pelvis.

No acute osseous abnormality.
IMPRESSION: 1. Pulmonary edema with likely persistent left lung
infection/inflammation.
2. Nonobstructive bowel gas pattern.

## 2021-08-31 IMAGING — DX DG ABDOMEN 1V
2 series · 2 of 2 positions shown · non-contrast
Comparison: CHEST X-RAY:
COMPARISON: CHEST X-RAY
[DATE], CT chest [DATE]

CLINICAL DATA: Constipation, follow-up exam Hx diabetes Patient
reports not having a bowel movement in the past 2 days

EXAM:
ABDOMEN - 1 VIEW; CHEST - 2 VIEW

[abdomen kub (1 of 2)]
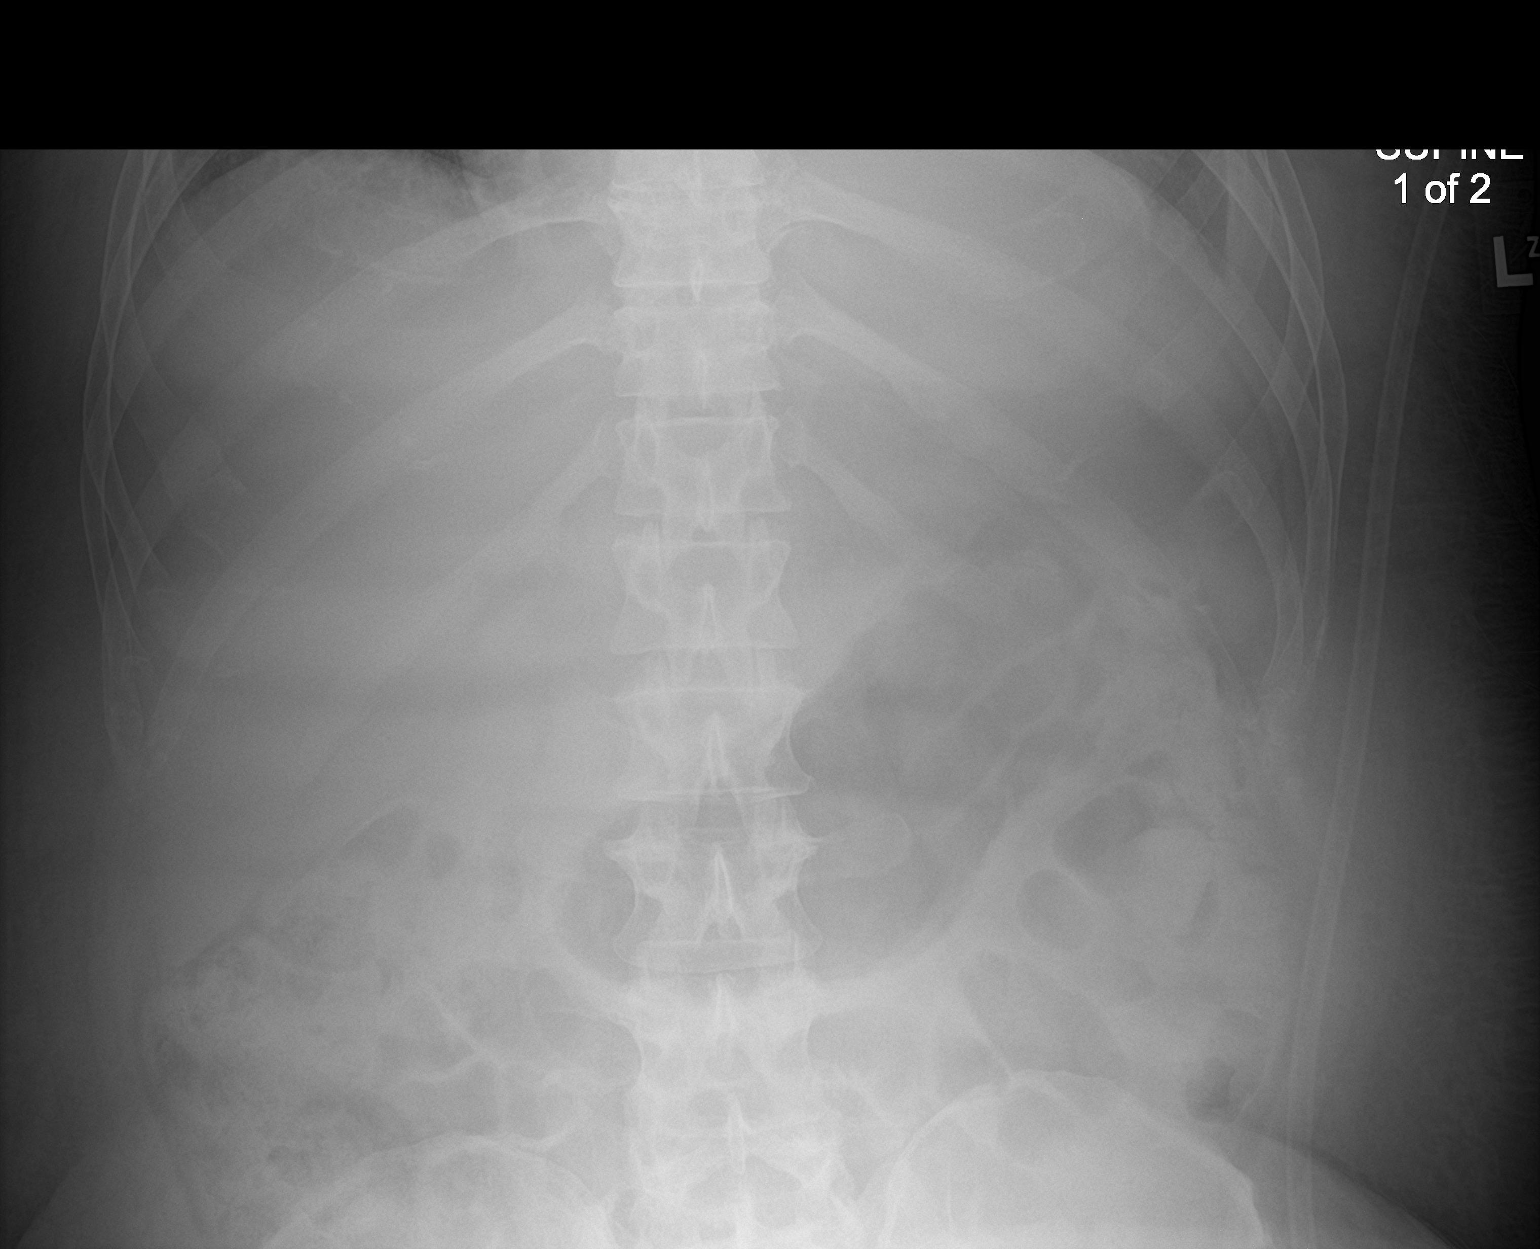

[abdomen kub (2 of 2)]
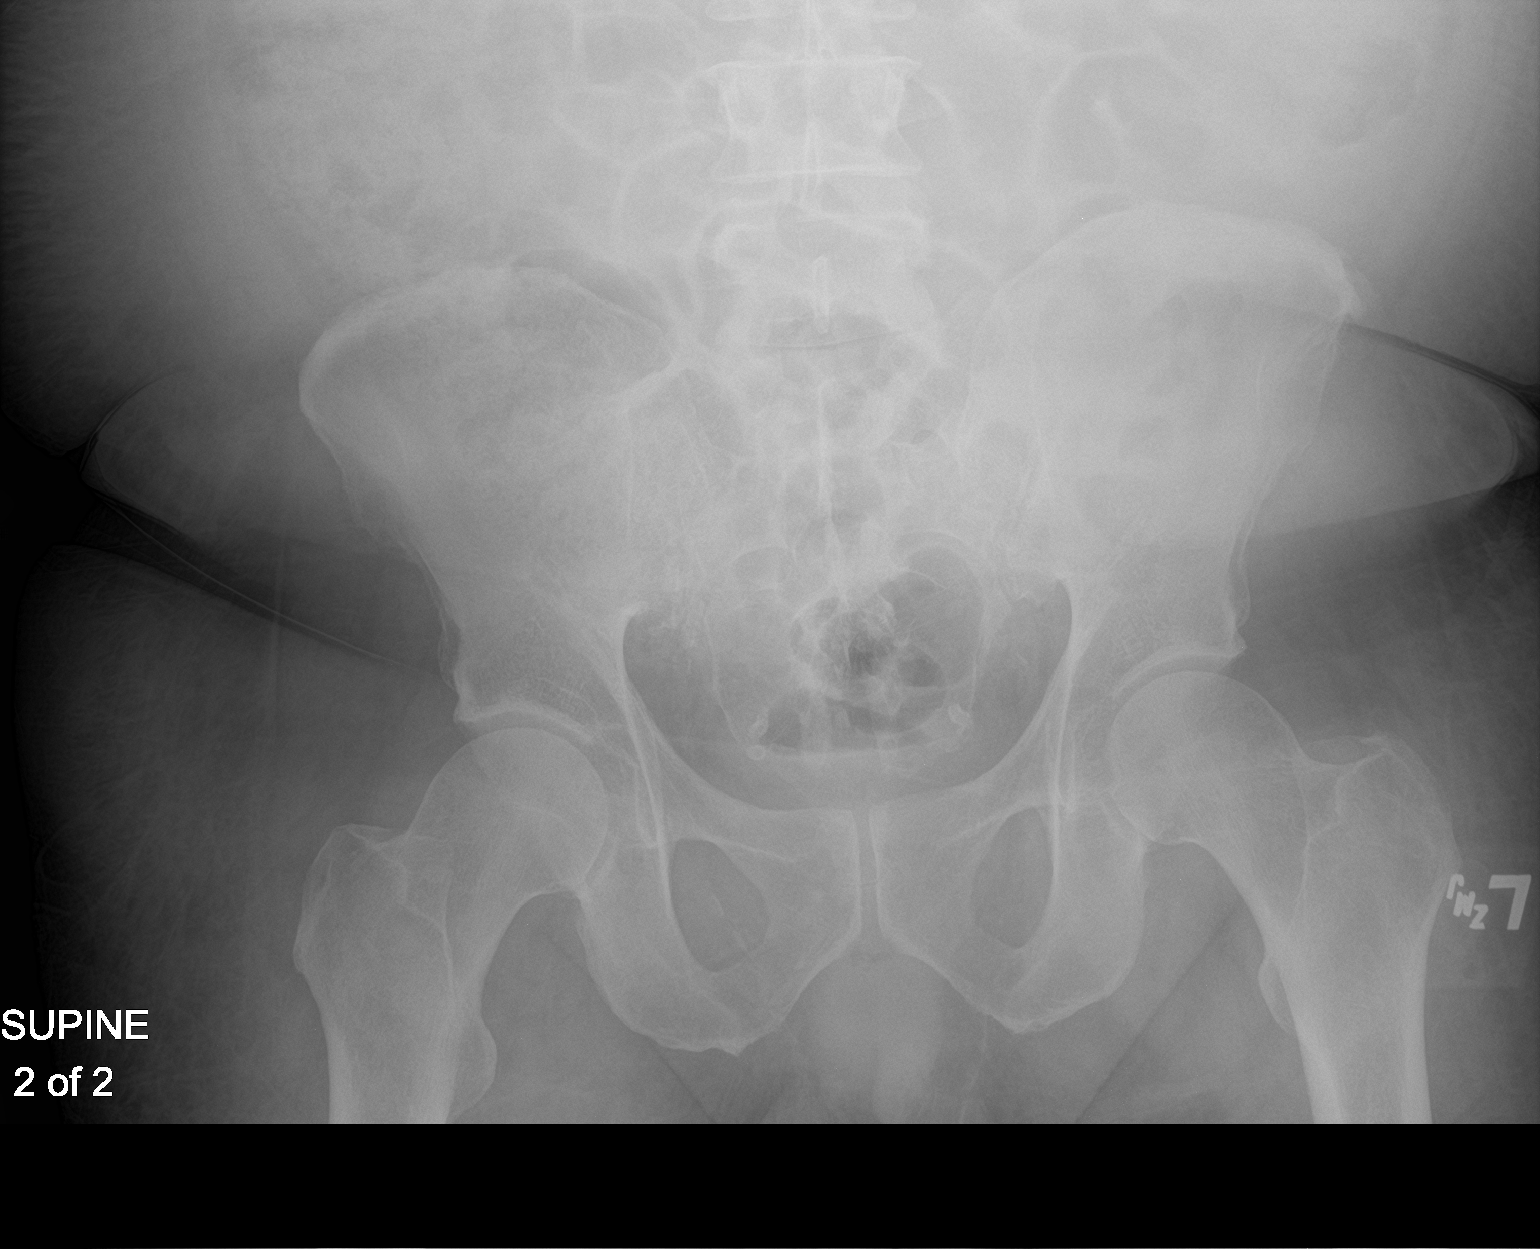

[2 of 2 positions shown; findings below may reference images not displayed]

FINDINGS: Right PICC with tip overlying distal superior vena cava. The heart
and mediastinal contours are within normal limits. Prominence of the
hilar vasculature.

Airspace opacity of the left upper left lower lobes. Increased
interstitial markings. No pleural effusion. No pneumothorax.

The bowel gas pattern is normal. No radio-opaque calculi or other
significant radiographic abnormality are seen. Vascular
calcifications within the pelvis.

No acute osseous abnormality.
IMPRESSION: 1. Pulmonary edema with likely persistent left lung
infection/inflammation.
2. Nonobstructive bowel gas pattern.

## 2021-08-31 MED ORDER — ACETAMINOPHEN 325 MG PO TABS
325.0000 mg | ORAL_TABLET | ORAL | Status: DC | PRN
Start: 2021-08-31 — End: 2022-01-01

## 2021-08-31 MED ORDER — SENNOSIDES-DOCUSATE SODIUM 8.6-50 MG PO TABS
2.0000 | ORAL_TABLET | Freq: Every day | ORAL | Status: DC
  Administered 2021-08-31 – 2021-09-15 (×14): 2 via ORAL
  Filled 2021-08-31 (×16): qty 2

## 2021-08-31 MED ORDER — BENEPROTEIN PO POWD
1.0000 | Freq: Three times a day (TID) | ORAL | Status: DC
  Administered 2021-08-31 – 2021-09-18 (×15): 6 g via ORAL
  Filled 2021-08-31: qty 227

## 2021-08-31 MED ORDER — KATE FARMS STANDARD 1.4 PO LIQD
325.0000 mL | Freq: Two times a day (BID) | ORAL | Status: DC
  Filled 2021-08-31 (×8): qty 325

## 2021-08-31 MED ORDER — INSULIN ASPART 100 UNIT/ML IJ SOLN
0.0000 [IU] | Freq: Three times a day (TID) | INTRAMUSCULAR | Status: DC
  Administered 2021-08-31: 4 [IU] via SUBCUTANEOUS
  Administered 2021-09-01: 7 [IU] via SUBCUTANEOUS
  Administered 2021-09-01 – 2021-09-02 (×3): 4 [IU] via SUBCUTANEOUS
  Administered 2021-09-02 – 2021-09-04 (×4): 3 [IU] via SUBCUTANEOUS
  Administered 2021-09-04 – 2021-09-05 (×2): 7 [IU] via SUBCUTANEOUS
  Administered 2021-09-05: 4 [IU] via SUBCUTANEOUS
  Administered 2021-09-06: 3 [IU] via SUBCUTANEOUS
  Administered 2021-09-06: 4 [IU] via SUBCUTANEOUS
  Administered 2021-09-07: 7 [IU] via SUBCUTANEOUS
  Administered 2021-09-08 – 2021-09-11 (×5): 3 [IU] via SUBCUTANEOUS
  Administered 2021-09-11: 4 [IU] via SUBCUTANEOUS
  Administered 2021-09-12: 3 [IU] via SUBCUTANEOUS
  Administered 2021-09-13: 4 [IU] via SUBCUTANEOUS
  Administered 2021-09-13: 3 [IU] via SUBCUTANEOUS
  Administered 2021-09-14: 4 [IU] via SUBCUTANEOUS
  Administered 2021-09-14 – 2021-09-16 (×4): 3 [IU] via SUBCUTANEOUS
  Administered 2021-09-17: 4 [IU] via SUBCUTANEOUS
  Administered 2021-09-17 – 2021-09-18 (×3): 3 [IU] via SUBCUTANEOUS
  Administered 2021-09-18 – 2021-09-19 (×2): 4 [IU] via SUBCUTANEOUS

## 2021-08-31 MED ORDER — BOOST / RESOURCE BREEZE PO LIQD CUSTOM
1.0000 | Freq: Three times a day (TID) | ORAL | Status: DC
  Administered 2021-08-31 – 2021-09-21 (×47): 1 via ORAL

## 2021-08-31 MED ORDER — TAMSULOSIN HCL 0.4 MG PO CAPS
0.4000 mg | ORAL_CAPSULE | Freq: Every day | ORAL | Status: DC
  Administered 2021-08-31 – 2021-09-05 (×6): 0.4 mg via ORAL
  Filled 2021-08-31 (×6): qty 1

## 2021-08-31 NOTE — Progress Notes (Addendum)
Occupational Therapy Session Note  Patient Details  Name: Jose [name] MRN: [id number] Date of Birth: [birth year]  Today's Date: 08/31/2021 OT Individual Time: 3016-0109 OT Individual Time Calculation (min): 60 min    Short Term Goals: Week 1:  OT Short Term Goal 1 (Week 1): Pt will tolerate sitting EOB ~10 minutes during an ADL session to demonstrate improvement in pain mgt OT Short Term Goal 2 (Week 1): Pt will complete 1/3 components of donning pants with AE as needed OT Short Term Goal 3 (Week 1): Pt will complete LB dressing at sit<stand level using LRAD  Skilled Therapeutic Interventions/Progress Updates:  Pt in bed during session with reports of increased pain 8/10 in his upper back and right buttocks.  Oxygen sats at 97% on 2Ls nasal cannula at rest. He was able to roll to the left with mod assist but unable to roll to the left, stating it's too painful.  He was unable to attempt EOB secondary to being in too much pain.  Nursing made aware and pain meds given.  Attempted raising HOB as well but pt only able to tolerate around 30-35 degrees before reporting too much pain.  Did work on [employer] with [place] slightly elevated and with use of 2.5 lb wrist weights.  He was able to complete shoulder flexion, elbow flexion, triceps extension, and incline chest press for 2 sets of 10 reps using the 2.5 weight on the LUE.  He was only able to complete 6 reps of shoulder flexion on the right with the 2.5 lb weight for the first set and then 10 reps on the second set.  He continues to report increased pain in the right posterior rib/scapula area with attempted shoulder movements.  He was only able to complete AROM on the right without weight for incline chest press and triceps extension for 10 reps each.  The weight was re-applied to the wrist for 2 sets of 10 reps elbow flexion.  He also completed 10 reps for heel slides and leg lifts in the bed for each LE.  Oxygen sats remained higher than 96%  with HR at 85 Finished session with pt missing 15 mins at the end secondary to fatigue and pain.  Discussed need to try and communicate with nursing based on his schedule to stay ahead of the pain if possible, as well as discussion with MD if pain meds aren't working as well as he would like.  Call button and phone in reach at end of session.     Therapy Documentation Precautions:  Precautions Precautions: Fall, Back Precaution Booklet Issued:  (verbally reviewed post-up back precautions) Precaution Comments: 2L oxygen, high pain levels, PICC line Restrictions Weight Bearing Restrictions: No General: General OT Amount of Missed Time: 15 Minutes  Pain: Pain Assessment Pain Scale: Faces Pain Score: 0-No pain Pain Type: Acute pain Pain Location: Back Pain Descriptors / Indicators: Aching Pain Frequency: Intermittent Pain Onset: On-going Patients Stated Pain Goal: 2 Pain Intervention(s): Medication (See eMAR)   Therapy/Group: Individual Therapy  Denilson Salminen OTR/L 08/31/2021, 3:43 PM

## 2021-08-31 NOTE — Progress Notes (Signed)
Urinary catheter discontinues as ordered at 12:30pm. 878mls of clear, amber color urine emptied from catheter.  Bladder scanned at 16:25 and noted to have 347mls of urine in bladder.  In/Out straight catheterization performed as ordered if bladder  scan results if over 326mls; obtained 348mls of clear, yellow urine. Patient tolerated procedure well.

## 2021-08-31 NOTE — Progress Notes (Signed)
PROGRESS NOTE   Subjective/Complaints:  Pt reports still has thrush- makes things taste weird.   Bowel OK- LBM 2 days ago- is normal for him to go q2-3 days.   Wearing O2- is new since hospitalized.    ROS:  Pt denies SOB, abd pain, CP, N/V/C/D, and vision changes   Objective:   No results found. Recent Labs    08/29/21 0353 08/31/21 0359  WBC 8.9 7.7  HGB 8.5* 8.4*  HCT 28.8* 27.3*  PLT 318 339   Recent Labs    08/29/21 0353 08/31/21 0359  NA 138 136  K 4.2 3.5  CL 112* 108  CO2 21* 20*  GLUCOSE 277* 108*  BUN 48* 37*  CREATININE 1.56* 1.34*  CALCIUM 8.2* 8.4*    Intake/Output Summary (Last 24 hours) at 08/31/2021 0839 Last data filed at 08/31/2021 0600 Gross per 24 hour  Intake 100 ml  Output 2450 ml  Net -2350 ml     Pressure Injury 08/10/21 Coccyx Medial Stage 2 -  Partial thickness loss of dermis presenting as a shallow open injury with a red, pink wound bed without slough. (Active)  08/10/21 1700  Location: Coccyx (gluteal crease)  Location Orientation: Medial  Staging: Stage 2 -  Partial thickness loss of dermis presenting as a shallow open injury with a red, pink wound bed without slough.  Wound Description (Comments):   Present on Admission: Yes     Pressure Injury 08/14/21 Buttocks Bilateral Deep Tissue Pressure Injury - Purple or maroon localized area of discolored intact skin or blood-filled blister due to damage of underlying soft tissue from pressure and/or shear. DTI (Active)  08/14/21 0800  Location: Buttocks  Location Orientation: Bilateral  Staging: Deep Tissue Pressure Injury - Purple or maroon localized area of discolored intact skin or blood-filled blister due to damage of underlying soft tissue from pressure and/or shear.  Wound Description (Comments): DTI  Present on Admission:     Physical Exam: Vital Signs Blood pressure (!) 166/83, pulse 88, temperature 98.3 F (36.8  C), temperature source Oral, resp. rate 18, height 5\' 11"  (1.803 m), weight 92.7 kg, SpO2 98 %.    General: awake, alert, appropriate, NAD HENT: conjugate gaze; oropharynx moist; wearing O2 by [place]; very erythematous tongue with a few thrush plaques CV: regular rate; no JVD Pulmonary: slightly coarse- decreased at bases B/L-  GI: soft, NT, ND, (+)BS Psychiatric: appropriate, vague Neuro: alert motor strength is 5/5 in bilateral deltoid, bicep, tricep, grip, 4-/5 hip flexor, knee extensors, 3/5 ankle dorsiflexor and plantar flexor Sensory exam reduced LT and Proprioception bilateral feet  Assessment/Plan: 1. Functional deficits which require 3+ hours per day of interdisciplinary therapy in a comprehensive inpatient rehab setting. Physiatrist is providing close team supervision and 24 hour management of active medical problems listed below. Physiatrist and rehab team continue to assess barriers to discharge/monitor patient progress toward functional and medical goals  Care Tool:  Bathing              Bathing assist       Upper Body Dressing/Undressing Upper body dressing        Upper body assist  Lower Body Dressing/Undressing Lower body dressing            Lower body assist       Toileting Toileting    Toileting assist       Transfers Chair/bed transfer  Transfers assist           Locomotion Ambulation   Ambulation assist              Walk 10 feet activity   Assist           Walk 50 feet activity   Assist           Walk 150 feet activity   Assist           Walk 10 feet on uneven surface  activity   Assist           Wheelchair     Assist               Wheelchair 50 feet with 2 turns activity    Assist            Wheelchair 150 feet activity     Assist          Blood pressure (!) 166/83, pulse 88, temperature 98.3 F (36.8 C), temperature source Oral, resp. rate 18,  height 5\' 11"  (1.803 m), weight 92.7 kg, SpO2 98 %.    Medical Problem List and Plan: 1.  Decline in self care and mobility  secondary to thoracic myelopathy/thoracic osteomyelitis.              -patient may  shower             -ELOS/Goals: 10-12d  11/7- first day of evaluations- con't PT and OT- will see if needs SLP? 2.  Antithrombotics: -DVT/anticoagulation:  N/A             -antiplatelet therapy: none 3. Pain Management: Oxycodone prn.  4. Mood: LCSW to follow for evaluation and support.              -antipsychotic agents: N/A 5. Neuropsych: This patient is intermittently capable of making decisions on his own behalf. 6. Skin/Wound Care: Air mattress for pressure relief measures.  7. Fluids/Electrolytes/Nutrition: Monitor I/O. Check lytes in am.  8. MSSA bacteremia: On ancef with end date 12/01 9. UGIB/melena: Has received 11 units PRBC during admission --Continue protonix BID w/ carafate ac/hs.  --Monitor for signs of bleeding. 10. T2DM: Po intake poor --continue TF at nights. Continue insulin gargline 12 units at nights --monitor BS every 4 hours with SSI 11/7- BG's look great- still poor appetite, but I think thrush is making it worse- things "taste weird". Con't to monitor and regimen 11. HTN: Monitor BP TID--continue Norvasc and metoprolol for control.   11/7- BP somewhat elevated in 832P- systolic- will monitor for trend.  12. ABLA: On aranesp weekly for supplement.  11/7- Hb stable at 8.4- con't to monitor 13. Acute renal insufficiency- 11/7- improving- Cr down to 1.37 and BUN down to 37-   14. O2 need/acute resp failure  11/7- will wean O2 as able 15. Dispo  11/7- wife left note asking for "CNS w/u and endo work up as well as some more esoteric dx's"- will need to discuss with her before order any work up.      LOS: 1 days A FACE TO FACE EVALUATION WAS PERFORMED  Dafney Farler 08/31/2021, 8:39 AM

## 2021-08-31 NOTE — Progress Notes (Signed)
Nutrition Follow-up  DOCUMENTATION CODES:   Not applicable  INTERVENTION:  Continue Boost Breeze po TID, each supplement provides 250 kcal and 9 grams of protein  Provide Beneprotein 1 scoop po TID with meal, each scoop provides 25 kcal and 6 grams of protein.   Continue [employer] standard 1.4 cal po BID, each supplement provides 455 kcal and 20 grams of protein.   Encourage adequate PO intake.  NUTRITION DIAGNOSIS:   Increased nutrient needs related to post-op healing as evidenced by estimated needs.  GOAL:   Patient will meet greater than or equal to 90% of their needs  MONITOR:   PO intake, Supplement acceptance, Diet advancement, Labs, Weight trends, Skin, I & O's  REASON FOR ASSESSMENT:   Consult Assessment of nutrition requirement/status  ASSESSMENT:   45 year old male with history of T2DM with neuropathy and retinopathy, CKD, Macular degeneration, GAD. Recent findings of lytic lesions T5,T6 and sternum with pathologic fracture T6 as well as MSSA bacteremia. 10/14 with worsening of pain and found to have increased in paraspinal phelegmon with new epidural phelgmon and interval T5 pathologic fracture with severe left eccentric canal stenosis and overall finding likely secondary to progressive diskitis/osteomyelitis. S/p and T5 laminectomy with left T5 pediculotomy for decompression of spinal cord. 10/17 revealing severe esophagitis with black discoloration of mucosa, large amount of retained coffee ground fluid in stomach which was suctioned as well as few non bleeding superficial duodenal ulcers without stigmata of bleeding. Repeat EGD showing improvement in esophagitis Pt limited by back pain and weakness affecting ADLs and mobility. CIR recommended due to functional decline. Cortrak out 11/5.  Meal completion has been 20-75%. Pt reports appetite fine during time of visit. Family at bedside has been encouraging pt po intake. Pt reports intolerance to Ensure and dislike of  Prosource plus. Pt currently has Boost Breeze and has been consuming them. Pt requesting protein powder to mix into his food at meals. RD to order Beneprotein powder. Pt additionally has [employer] (dairy free supplement) to aid in PO intake. Pt encouraged to eat his food at meals and to drink his supplements.   Unable to complete Nutrition-Focused physical exam at this time.   Labs and medications reviewed.   Diet Order:   Diet Order             DIET DYS 3 Room service appropriate? Yes with Assist; Fluid consistency: Thin  Diet effective now                   EDUCATION NEEDS:   Not appropriate for education at this time  Skin:  Skin Assessment: Skin Integrity Issues: Skin Integrity Issues:: Stage II, DTI, Incisions, Diabetic Ulcer DTI: buttocks Stage II: coccyx Diabetic Ulcer: L big toe Incisions: back  Last BM:  11/2  Height:   Ht Readings from Last 1 Encounters:  08/30/21 5\' 11"  (1.803 m)    Weight:   Wt Readings from Last 1 Encounters:  08/31/21 92.7 kg   BMI:  Body mass index is 28.5 kg/m.  Estimated Nutritional Needs:   Kcal:  2150-2350  Protein:  110-120 grams  Fluid:  >/= 2 L/day  Corrin Parker, MS, RD, LDN RD pager number/after hours weekend pager number on [employer].

## 2021-08-31 NOTE — Discharge Instructions (Addendum)
Inpatient Rehab Discharge Instructions  Jose [name] Discharge date and time:  09/21/21  Activities/Precautions/ Functional Status: Activity: no lifting, driving, or strenuous exercise for till cleared by MD Diet: diabetic diet Wound Care:  Sacral wound: Use dakins solution to cleanse wound bed then apply santyl and cover with damp to dry dressing. Do this twice day tomorrow. Then stop using Dakins. Starting on Wednesday --Cleanse with normal saline, apply santyl then cover with damp to dry dressing. Change dressing twice a day.  2. Back wound: keep wound clean and dry . Contact Dr. Kathyrn Sheriff if you develop any problems with your incision/wound--redness, swelling, increase in pain, drainage or if you develop fever or chills.     Functional status:  ___ No restrictions     ___ Walk up steps independently _X__ 24/7 supervision/assistance   ___ Walk up steps with assistance ___ Intermittent supervision/assistance  ___ Bathe/dress independently ___ Walk with walker     __X_ Bathe/dress with assistance ___ Walk Independently    ___ Shower independently ___ Walk with assistance    ___ Shower with assistance _X__ No alcohol     ___ Return to work/school ________   Special Instructions: Monitor blood sugars before meals and at bedtime and record them. Follow up with PCP for further adjustment in regimen. 2. Need to increase protein intake--at lest snacks twice a day. 3. Pressure relief measures--lie on side when in bed and boost every 20 minutes when in chair.    COMMUNITY REFERRALS UPON DISCHARGE:    Home Health:   RN                 Agency:[facility] Phone: [phone]  Outpatient: PT & OT             Agency:[facility] [place] Phone: [phone]             Appointment Date/Time: WILL CONTACT PATIENT OR WIFE TO SET UP APPOINTMENTS  Medical Equipment/Items Ordered: WHEELCHAIR AND ROHO CUSHION AND LOW AIR LOSS [place] BED ALREADY HAS                                                 Agency/Supplier:[facility]  [phone]  [facility]: [phone]    My questions have been answered and I understand these instructions. I will adhere to these goals and the provided educational materials after my discharge from the hospital.  Patient/Caregiver Signature _______________________________ Date __________  Clinician Signature _______________________________________ Date __________  Please bring this form and your medication list with you to all your follow-up doctor's appointments.

## 2021-08-31 NOTE — Progress Notes (Addendum)
Inpatient Rehabilitation Admission Medication Review by a Pharmacist  A complete drug regimen review was completed for this patient to identify any potential clinically significant medication issues.  High Risk Drug Classes Is patient taking? Indication by Medication  Antipsychotic No   Anticoagulant No   Antibiotic Yes, as an intravenous medication Cefazolin for osteomyelitis  Opioid No   Antiplatelet No   Hypoglycemics/insulin Yes Novolog and Semglee for T2DM  Vasoactive Medication Yes Amlodipine and Lopressor for HTN  Chemotherapy No   Other Yes Gabapentin for neuropathy     Type of Medication Issue Identified Description of Issue Recommendation(s)  Drug Interaction(s) (clinically significant)     Duplicate Therapy     Allergy     No Medication Administration End Date     Incorrect Dose     Additional Drug Therapy Needed     Significant med changes from prior encounter (inform family/care partners about these prior to discharge).    Other      Clinically significant medication issues were identified that warrant physician communication and completion of prescribed/recommended actions by midnight of the next day:  Yes  Pharmacist comments:  Cefazolin End Date: 09/24/2021  Time spent performing this drug regimen review (minutes):  20 Minutes  Thank you, Kathe Mariner, PharmD Candidate 08/31/2021 7:54 AM  I discussed / reviewed the pharmacy note by Mr. Sherrian Divers and I agree with the resident's findings and plans as documented.   Thank you Anette Guarneri, PharmD

## 2021-08-31 NOTE — Evaluation (Signed)
Physical Therapy Assessment and Plan  Patient Details  Name: Jose [name] MRN: [id number] Date of Birth: Jul 13, 1976  PT Diagnosis: Abnormal posture, Abnormality of gait, Difficulty walking, Impaired sensation, Muscle weakness, and Pain in back Rehab Potential: Good ELOS: 3 weeks   Today's Date: 08/31/2021 PT Individual Time: 1100-1159 PT Individual Time Calculation (min): 59 min    Hospital Problem: Active Problems:   Debility   Past Medical History:  Past Medical History:  Diagnosis Date   GAD (generalized anxiety disorder)    Hyperlipidemia    Macular degeneration, bilateral    Retinopathy    Type II diabetes mellitus with complication, uncontrolled    retinopathy, neuropathy, microalbuminuria   Past Surgical History:  Past Surgical History:  Procedure Laterality Date   APPENDECTOMY     BIOPSY  08/10/2021   Procedure: BIOPSY;  Surgeon: Otis Brace, MD;  Location: [place];  Service: Gastroenterology;;   BUBBLE STUDY  07/29/2021   Procedure: BUBBLE STUDY;  Surgeon: Jerline Pain, MD;  Location: [place];  Service: Cardiovascular;;   ESOPHAGOGASTRODUODENOSCOPY (EGD) WITH PROPOFOL N/A 08/10/2021   Procedure: ESOPHAGOGASTRODUODENOSCOPY (EGD) WITH PROPOFOL;  Surgeon: Otis Brace, MD;  Location: [place];  Service: Gastroenterology;  Laterality: N/A;   ESOPHAGOGASTRODUODENOSCOPY (EGD) WITH PROPOFOL N/A 08/24/2021   Procedure: ESOPHAGOGASTRODUODENOSCOPY (EGD) WITH PROPOFOL;  Surgeon: Otis Brace, MD;  Location: [facility];  Service: Gastroenterology;  Laterality: N/A;   HERNIA REPAIR     IR FLUORO GUIDED NEEDLE PLC ASPIRATION/INJECTION LOC  07/28/2021   LUMBAR LAMINECTOMY/DECOMPRESSION MICRODISCECTOMY N/A 08/07/2021   Procedure: THORACIC FOUR - THORACIC FIVE LAMINECTOMY/DECOMPRESSION OF SPINAL CORD, DEBRIDEMENT OF ABSCESS, MICRODISCECTOMY, INTRAOPERATIVE ULTRASOUND;  Surgeon: Consuella Lose, MD;  Location: [place];  Service: Neurosurgery;   Laterality: N/A;   TEE WITHOUT CARDIOVERSION N/A 07/29/2021   Procedure: TRANSESOPHAGEAL ECHOCARDIOGRAM (TEE);  Surgeon: Jerline Pain, MD;  Location: [facility];  Service: Cardiovascular;  Laterality: N/A;   TRIGGER FINGER RELEASE Right 10/25/2019   Procedure: RIGHT INDEX FINGER RELEASE TRIGGER FINGER/A-1 PULLEY;  Surgeon: Daryll Brod, MD;  Location: [place];  Service: Orthopedics;  Laterality: Right;  IV REGIONAL FOREARM BLOCK    Assessment & Plan Clinical Impression: Patient is a 45 year old male with history of T2DM with neuropathy and retinopathy, CKD, Macular degeneration, GAD with 6 week history of chest pain radiating to bilateral shoulders who admitted 9/28- 10/10 for work up. He was found to have lytic lesions T5,T6 and sternum with pathologic fracture T6 as well as MSSA bacteremia and was discharged to home on IV antibiotics. He was readmitted on 10/14 with worsening of pain and found to have increased in paraspinal phelegmon with new epidural phelgmon and interval T5 pathologic fracture with severe left eccentric canal stenosis and overall finding likely secondary to progressive diskitis/osteomyelitis.   He was taken to OR for T4 and T5 laminectomy with left T5 pediculotomy for decompression of spinal cord by Dr. Kathyrn Sheriff. Work up for MM and bone biopsy negative for malignancy.   Hospital course significant for abdominal pain with distension with coffee ground emesis 10/16 followed by drop in Hgb 6.2 with hypoxia and confusion. He was found to have multifocal PNA due to aspiration and PCCM consulted. Patient required BIPAP as well as 2 units PRBC.  He was evaluated by GI and underwent UGI by Dr. Alessandra Bevels on 10/17 revealing severe esophagitis with black discoloration of mucosa, large amount of retained coffee ground fluid in stomach which was suctioned as well as few non bleeding superficial  duodenal ulcers without stigmata of bleeding.  He was started on IV protonix and  transitioned to po BID with recommendations to continue for 2 months followed by daily dose.     CT head done negative for acute changes. He was started on fluconazole for candida esophagitis and CT chest showed possible enlargening effusions and abscesses. He was treated with stress dose steroids for ARDS and underwent thoracentesis of 250 cc of yellow clear fluid from left pleural space on 10/21 and tolerated extubation by 10/24. Nephrology following for acute on chronic renal failure with supportive care. He continued to have issues with confusion as well as recurrent respiratory distress with drop in Hgb to 5.8 on 10/26 and was treated with 2 units PRBC and monitored with serial H/H. He had large melanotic stools on 10/27 with small amount of bloody emesis. He was treated with protonix drip and supportive care.    V/Q scan done 10/27 showing large perfusion defect in LUL not typical of PE but unable to exclude PE and CT chest not done due to renal issues. BLE dopplers negative for DVT.  He developed recurrent tarry stools and underwent repeat EGD showing improvement in esophagitis with bilious fluid in stomach and no gastric or duodenal ulcers-->no further GI work up planned and GI signed off.  Diet resumed but he continues on tube feeds due to poor po intake with episodic hypoglycemia. CBC showed H/H to be stable, bloody stools have resolved and SCr is steadily improving. He has completed course of diflucan and transitioned to ancef with end date of 12/01. He continues to be limited by back pain and weakness affecting ADLs and mobility. CIR recommended due to functional decline. Patient transferred to CIR on 08/30/2021 .   Patient currently requires mod with mobility secondary to muscle weakness, decreased cardiorespiratoy endurance and decreased oxygen support, and decreased sitting balance, decreased standing balance, decreased postural control, and decreased balance strategies.  Prior to  hospitalization, patient was independent  with mobility and lived with Spouse, Family, Other (Comment) (67 y/o child) in a House home.  Home access is 3 steps without railsStairs to enter.  Patient will benefit from skilled PT intervention to maximize safe functional mobility, minimize fall risk, and decrease caregiver burden for planned discharge home with 24 hour assist.  Anticipate patient will benefit from follow up [facility] at discharge.  PT - End of Session Activity Tolerance: Tolerates < 10 min activity, no significant change in vital signs Endurance Deficit: Yes Endurance Deficit Description: pain > endurance deficit but both contribute to ability to participate in therapy PT Assessment Rehab Potential (ACUTE/IP ONLY): Good PT Barriers to Discharge: Home environment access/layout;Wound Care;Insurance for SNF coverage;Other (comments) PT Barriers to Discharge Comments: Pain PT Patient demonstrates impairments in the following area(s): Balance;Edema;Behavior;Endurance;Motor;Pain;Sensory;Safety;Skin Integrity PT Transfers Functional Problem(s): Bed Mobility;Bed to Chair;Car PT Locomotion Functional Problem(s): Ambulation;Stairs PT Plan PT Intensity: Minimum of 1-2 x/day ,45 to 90 minutes PT Frequency: 5 out of 7 days PT Duration Estimated Length of Stay: 3 weeks PT Treatment/Interventions: Ambulation/gait training;Balance/vestibular training;Cognitive remediation/compensation;Community reintegration;Discharge planning;Disease management/prevention;DME/adaptive equipment instruction;Functional electrical stimulation;Functional mobility training;Therapeutic Exercise;Psychosocial support;Patient/family education;Pain management;Neuromuscular re-education;Skin care/wound management;Splinting/orthotics;Stair training;Therapeutic Activities;UE/LE Strength taining/ROM;UE/LE Coordination activities;Visual/perceptual remediation/compensation;Wheelchair propulsion/positioning PT Transfers Anticipated  Outcome(s): supervision with LRAD PT Locomotion Anticipated Outcome(s): supervision with LRAD PT Recommendation Recommendations for Other Services: Neuropsych consult Follow Up Recommendations: 24 hour supervision/assistance;Home health PT (Pending progress - HHPT vs OPPT) Patient destination: Home Equipment Recommended: To be determined Equipment Details: Pt owns a 3-1 commode  PT Evaluation Precautions/Restrictions Precautions Precautions: Fall;Back Precaution Booklet Issued:  (verbally reviewed post-up back precautions) Precaution Comments: 2L oxygen, high pain levels Restrictions Weight Bearing Restrictions: No General Chart Reviewed: Yes Family/Caregiver Present: Yes (wife) Pain Pain Assessment Pain Scale: 0-10 Pain Score: 6  Pain Type: Acute pain;Surgical pain Pain Location: Scapula Pain Orientation: Right Pain Descriptors / Indicators: Aching Pain Intervention(s): RN made aware;Repositioned;Ambulation/increased activity Pain Interference Pain Interference Pain Effect on Sleep: 4. Almost constantly Pain Interference with Therapy Activities: 4. Almost constantly Pain Interference with Day-to-Day Activities: 3. Frequently Home Living/Prior Functioning Home Living Living Arrangements: Spouse/significant other;Children Available Help at Discharge: Family;Available 24 hours/day Type of Home: House Home Access: Stairs to enter [employer] of Steps: 3 steps without rails Entrance Stairs-Rails: None Home Layout: One level Bathroom Shower/Tub: Chiropodist: Standard Bathroom Accessibility: Yes  Lives With: Spouse;Family;Other (Comment) (60 y/o child) Prior Function Level of Independence: Independent with gait;Independent with transfers;Independent with homemaking with ambulation;Independent with basic ADLs  Able to Take Stairs?: Yes Driving: Yes Vocation: Full time employment 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 Vision/Perception  Vision - History Ability to See in Adequate Light: 0 Adequate Perception Perception: Within Functional Limits Praxis Praxis: Intact  Cognition Overall Cognitive Status: Within Functional Limits for tasks assessed Arousal/Alertness: Awake/alert Orientation Level: Oriented X4 Memory: Appears intact Awareness: Appears intact Problem Solving: Appears intact Behaviors: Other (comment) (poor pain tolerance) Safety/Judgment: Impaired Sensation Sensation Light Touch: Impaired by gross assessment Hot/Cold: Appears Intact Proprioception: Impaired by gross assessment Stereognosis: Appears Intact Additional Comments: bilateral neuropathy. Decreaed sensation distally > proximally and worse on the L > R Coordination Gross Motor Movements are Fluid and Coordinated: No Fine Motor Movements are Fluid and Coordinated: No Coordination and Movement Description: Limited mostly by pain and global deconditioning Finger Nose Finger Test: Tremor's but not dysmetria Heel Shin Test: Limited by some hip flexor weakness but overall WFL Motor  Motor Motor: Abnormal postural alignment and control Motor - Skilled Clinical Observations: Global weakness and deconditioning 2/2 prolonged and complicated hospital course. Significant pain limiting functional mobility.   Trunk/Postural Assessment  Cervical Assessment Cervical Assessment: Exceptions to [facility] (forward head) Thoracic Assessment Thoracic Assessment: Exceptions to [facility] (rounded shoulders) Lumbar Assessment Lumbar Assessment: Exceptions to [facility] (flexible posterior pelvic tilt in sitting) Postural Control Postural Control: Deficits on evaluation Trunk Control: pain limiting  Balance Balance Balance Assessed: Yes Static Sitting Balance Static Sitting - Balance Support: Feet supported Static Sitting - Level of Assistance: 5: Stand by assistance Extremity Assessment      RLE Assessment RLE Assessment: Exceptions to  [facility] General Strength Comments: Grossly 4-/5 LLE Assessment LLE Assessment: Exceptions to [facility] General Strength Comments: Grossly 4-/5  Care Tool Care Tool Bed Mobility Roll left and right activity   Roll left and right assist level: Minimal Assistance - Patient > 75%    Sit to lying activity   Sit to lying assist level: Moderate Assistance - Patient 50 - 74%    Lying to sitting on side of bed activity   Lying to sitting on side of bed assist level: the ability to move from lying on the back to sitting on the side of the bed with no back support.: Moderate Assistance - Patient 50 - 74%     Care Tool Transfers Sit to stand transfer Sit to stand activity did not occur: Safety/medical concerns (pain)      Chair/bed transfer Chair/bed transfer activity did not occur: Safety/medical concerns       Toilet  Clinical biochemist transfer activity did not occur: Safety/medical concerns        Care Tool Locomotion Ambulation Ambulation activity did not occur: Safety/medical concerns        Walk 10 feet activity Walk 10 feet activity did not occur: Safety/medical concerns       Walk 50 feet with 2 turns activity Walk 50 feet with 2 turns activity did not occur: Safety/medical concerns      Walk 150 feet activity Walk 150 feet activity did not occur: Safety/medical concerns      Walk 10 feet on uneven surfaces activity Walk 10 feet on uneven surfaces activity did not occur: Safety/medical concerns      Stairs Stair activity did not occur: Safety/medical concerns        Walk up/down 1 step activity Walk up/down 1 step or curb (drop down) activity did not occur: Safety/medical concerns      Walk up/down 4 steps activity Walk up/down 4 steps activity did not occur: Safety/medical concerns      Walk up/down 12 steps activity Walk up/down 12 steps activity did not occur: Safety/medical concerns      Pick up small objects from floor Pick up small object from the  floor (from standing position) activity did not occur: Safety/medical concerns      Wheelchair Is the patient using a wheelchair?: Yes Type of Wheelchair: Manual Wheelchair activity did not occur: Safety/medical concerns      Wheel 50 feet with 2 turns activity Wheelchair 50 feet with 2 turns activity did not occur: Safety/medical concerns    Wheel 150 feet activity Wheelchair 150 feet activity did not occur: Safety/medical concerns      Refer to Care Plan for Long Term Goals  SHORT TERM GOAL WEEK 1 PT Short Term Goal 1 (Week 1): Pt will complete bed mobility with minA and LRAD PT Short Term Goal 2 (Week 1): Pt will complete bed<>chair transfers with modA and LRAD PT Short Term Goal 3 (Week 1): Pt will tolerate sitting in recliner or TIS w/c for > 2 hours outside of therapies PT Short Term Goal 4 (Week 1): Pt will ambulate 24f with modA and LRAD  Recommendations for other services: Neuropsych  Skilled Therapeutic Intervention Mobility Bed Mobility Bed Mobility: Rolling Right;Rolling Left;Supine to Sit;Sit to Supine Rolling Right: Minimal Assistance - Patient > 75% Rolling Left: Minimal Assistance - Patient > 75% Supine to Sit: Moderate Assistance - Patient 50-74% Sit to Supine: Moderate Assistance - Patient 50-74% Transfers Transfers:  (deferred 2/2 high pain levels) Locomotion  Gait Ambulation: No Gait Gait: No Stairs / Additional Locomotion Stairs: No Wheelchair Mobility Wheelchair Mobility: No  Skilled Intervention: Pt greeted supine in bed with his wife at the bedside - pt agreeable to PT evaluation. Pt pleasant and cooperative, oriented x4. Pt appears to be in high pain levels upon entrance where he reports 6/10 upper back/ R scapula pain. He reports he's received pain medications recently for this. Retrieved TIS w/c from DME closet (will need a ROHO cushion but none available) to allow ability to recline for skin protection and reduce back discomfort in sitting. Pt  presents with global weakness and deconditioning, 2L supplemental oxygen. He reports that due to pain levels on his R side, it's difficult to get OOB on R. PT rearranged room to move bed to the L side to allow freedom of mobility to complete bed mobility towards his L. Completed rolling in  bed with minA and required modA for supine<>sit with log rolling technique. He's able to sit unsupported at EOB with SBA but begins c/o of 9/10 R upper back/scapula pain and then begins to feel noxious - able to tolerate sitting for ~1-2 minutes. Required modA for sit>supine for trunk support and BLE management. Wife assisted with supine scooting to [facility]. Bed alarm on, all needs in reach at conclusion of session. RN notified of pt's response to mobility.   Instructed pt in results of PT evaluation as detailed above, PT POC, rehab potential, rehab goals, and discharge recommendations. Additionally discussed CIR's policies regarding fall safety and use of chair alarm and/or quick release belt. Pt verbalized understanding and in agreement. Will update pt's family members as they become available.   Discharge Criteria: Patient will be discharged from PT if patient refuses treatment 3 consecutive times without medical reason, if treatment goals not met, if there is a change in medical status, if patient makes no progress towards goals or if patient is discharged from hospital.  The above assessment, treatment plan, treatment alternatives and goals were discussed and mutually agreed upon: by patient and by family  Alger Simons PT, DPT 08/31/2021, 12:11 PM

## 2021-08-31 NOTE — Progress Notes (Signed)
Inpatient Rehabilitation Care Coordinator Assessment and Plan Patient Details  Name: Jose [name] MRN: [id number] Date of Birth: 11-Jul-1976  Today's Date: 08/31/2021  Hospital Problems: Active Problems:   Debility  Past Medical History:  Past Medical History:  Diagnosis Date   GAD (generalized anxiety disorder)    Hyperlipidemia    Macular degeneration, bilateral    Retinopathy    Type II diabetes mellitus with complication, uncontrolled    retinopathy, neuropathy, microalbuminuria   Past Surgical History:  Past Surgical History:  Procedure Laterality Date   APPENDECTOMY     BIOPSY  08/10/2021   Procedure: BIOPSY;  Surgeon: Otis Brace, MD;  Location: [place];  Service: Gastroenterology;;   BUBBLE STUDY  07/29/2021   Procedure: BUBBLE STUDY;  Surgeon: Jerline Pain, MD;  Location: [place];  Service: Cardiovascular;;   ESOPHAGOGASTRODUODENOSCOPY (EGD) WITH PROPOFOL N/A 08/10/2021   Procedure: ESOPHAGOGASTRODUODENOSCOPY (EGD) WITH PROPOFOL;  Surgeon: Otis Brace, MD;  Location: [place];  Service: Gastroenterology;  Laterality: N/A;   ESOPHAGOGASTRODUODENOSCOPY (EGD) WITH PROPOFOL N/A 08/24/2021   Procedure: ESOPHAGOGASTRODUODENOSCOPY (EGD) WITH PROPOFOL;  Surgeon: Otis Brace, MD;  Location: [place];  Service: Gastroenterology;  Laterality: N/A;   HERNIA REPAIR     IR FLUORO GUIDED NEEDLE PLC ASPIRATION/INJECTION LOC  07/28/2021   LUMBAR LAMINECTOMY/DECOMPRESSION MICRODISCECTOMY N/A 08/07/2021   Procedure: THORACIC FOUR - THORACIC FIVE LAMINECTOMY/DECOMPRESSION OF SPINAL CORD, DEBRIDEMENT OF ABSCESS, MICRODISCECTOMY, INTRAOPERATIVE ULTRASOUND;  Surgeon: Consuella Lose, MD;  Location: [place];  Service: Neurosurgery;  Laterality: N/A;   TEE WITHOUT CARDIOVERSION N/A 07/29/2021   Procedure: TRANSESOPHAGEAL ECHOCARDIOGRAM (TEE);  Surgeon: Jerline Pain, MD;  Location: [facility];  Service: Cardiovascular;  Laterality: N/A;   TRIGGER FINGER  RELEASE Right 10/25/2019   Procedure: RIGHT INDEX FINGER RELEASE TRIGGER FINGER/A-1 PULLEY;  Surgeon: Daryll Brod, MD;  Location: [place];  Service: Orthopedics;  Laterality: Right;  IV REGIONAL FOREARM BLOCK   Social History:  reports that he quit smoking about 13 months ago. His smoking use included cigarettes. He has a 20.00 pack-year smoking history. He has never used smokeless tobacco. He reports that he does not currently use alcohol. He reports that he does not use drugs.  Family / Support Systems Marital Status: Married Patient Roles: Spouse, Production manager, Other (Comment) (employee) Spouse/Significant Other: Erin [phone] Children: children ages 35-12 yo Other Supports: Friends and co-workers Anticipated Caregiver: wife Ability/Limitations of Caregiver: Works from home and has to pick up kids from school Caregiver Availability: 24/7 Family Dynamics: Close knit family all pull together when one is in need. Pt feels he has good supports and is really appreciative of the people who have reached out to ask about him since this happen  Social History Preferred language: English Religion:  Cultural Background: No issues Education: [place] - How often do you need to have someone help you when you read instructions, pamphlets, or other written material from your doctor or pharmacy?: Never Writes: Yes Employment Status: Employed Name of Employer: [employer] Return to Work Plans: Hopes to once recovered Public relations account executive Issues: No issues Guardian/Conservator: None-according to MD pt is questionalbe capable of making his own decisions will include his wife for any decisions while here   Abuse/Neglect Abuse/Neglect Assessment Can Be Completed: Yes Physical Abuse: Denies Verbal Abuse: Denies Sexual Abuse: Denies Exploitation of patient/patient's resources: Denies Self-Neglect: Denies  Patient response to: Social  Isolation - How often do you feel lonely or isolated from those around you?: Never  Emotional Status Pt's affect, behavior and adjustment status: Pt is motivated and hopes to get out of the hospital soon, I have been here a long time and ready to go home. He is hopeful to regain his strength and to recover from this. He has always been independent and taken care of himself and hopes to again. Recent Psychosocial Issues: Recent admit in 06/2021 and came back Psychiatric History: no history do feel he would benefit from seeing neuro-psych while here. Will place on neuro-psych list to be seen for coping with long hospitalization Substance Abuse History: No issues  Patient / Family Perceptions, Expectations & Goals Pt/Family understanding of illness & functional limitations: Pt and wife can explain his health issues up until now and do talk with the MD's involved. Both are hopeful pt will do well and recover. 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, father, employee, home owner, etc Anticipated changes in roles/activities/participation: resume Pt/family expectations/goals: Pt states: "I hope to do well and get stronger to be able to go home soon."  Wife states: " I will do what he needs but am hopeful due to he wants to do on his own."  [employer]: None Premorbid Home Care/DME Agencies: Other (Comment) [facility] involved since on IV antibiotics prior to admission) Transportation available at discharge: Wife, pt drove PTA Is the patient able to respond to transportation needs?: Yes In the past 12 months, has lack of transportation kept you from medical appointments or from getting medications?: No In the past 12 months, has lack of transportation kept you from meetings, work, or from getting things needed for daily living?: No Resource referrals recommended: Neuropsychology  Discharge  Planning Living Arrangements: Spouse/significant other, Children Support Systems: Spouse/significant other, Children, Other relatives, Friends/neighbors Type of Residence: Private residence Insurance Resources: Multimedia programmer (specify) Sports administrator) Financial Resources: Employment, Secondary school teacher Screen Referred: No Living Expenses: Medical laboratory scientific officer Management: Patient, Spouse Does the patient have any problems obtaining your medications?: No Home Management: Wife Patient/Family Preliminary Plans: Return home with wife who does work from home and can assist him. They have children that need to be picked up from school so wife will need to leave for this. Will await team evaluations and work on discharge needs. Care Coordinator Barriers to Discharge: Insurance for SNF coverage, IV antibiotics Care Coordinator Anticipated Follow Up Needs: HH/OP  Clinical Impression Pleasant gentleman who is motivated and tired from his long hospital course. His wife is involved and supportive and will assist him at discharge. Will await therapy evaluations an work on discharge needs. Have placed on neuro-psych list  Elease Hashimoto 08/31/2021, 10:06 AM

## 2021-08-31 NOTE — Progress Notes (Signed)
Patient information reviewed and entered into [facility] by Becky Ramesha Poster, PPS coordinator. Information including medical coding, function ability, and quality indicators will be reviewed and updated through discharge.   

## 2021-08-31 NOTE — Progress Notes (Signed)
[facility] Individual Statement of Services  Patient Name:  Jose [name]  Date:  08/31/2021  Welcome to the [facility].  Our goal is to provide you with an individualized program based on your diagnosis and situation, designed to meet your specific needs.  With this comprehensive rehabilitation program, you will be expected to participate in at least 3 hours of rehabilitation therapies Monday-Friday, with modified therapy programming on the weekends.  Your rehabilitation program will include the following services:  Physical Therapy (PT), Occupational Therapy (OT), 24 hour per day rehabilitation nursing, Therapeutic Recreaction (TR), Neuropsychology, Care Coordinator, Rehabilitation Medicine, Nutrition Services, and Pharmacy Services  Weekly team conferences will be held on Tuesday to discuss your progress.  Your Inpatient Rehabilitation Care Coordinator will talk with you frequently to get your input and to update you on team discussions.  Team conferences with you and your family in attendance may also be held.  Expected length of stay: 21-23 days  Overall anticipated outcome: supervision with cues  Depending on your progress and recovery, your program may change. Your Inpatient Rehabilitation Care Coordinator will coordinate services and will keep you informed of any changes. Your Inpatient Rehabilitation Care Coordinator's name and contact numbers are listed  below.  The following services may also be recommended but are not provided by the [facility] will be made to provide these services after discharge if needed.  Arrangements include referral to agencies that provide these services.  Your insurance has been verified to be:  [facility] Your primary doctor is:  [name]  Pertinent  information will be shared with your doctor and your insurance company.  Inpatient Rehabilitation Care Coordinator:  Ovidio Kin, [place] or ([phone]  Information discussed with and copy given to patient by: Elease Hashimoto, 08/31/2021, 10:10 AM

## 2021-08-31 NOTE — Plan of Care (Signed)
  Problem: RH Balance Goal: LTG Patient will maintain dynamic standing with ADLs (OT) Description: LTG:  Patient will maintain dynamic standing balance with assist during activities of daily living (OT)  Flowsheets (Taken 08/31/2021 1218) LTG: Pt will maintain dynamic standing balance during ADLs with: Supervision/Verbal cueing   Problem: Sit to Stand Goal: LTG:  Patient will perform sit to stand in prep for activites of daily living with assistance level (OT) Description: LTG:  Patient will perform sit to stand in prep for activites of daily living with assistance level (OT) Flowsheets (Taken 08/31/2021 1218) LTG: PT will perform sit to stand in prep for activites of daily living with assistance level: Supervision/Verbal cueing   Problem: RH Eating Goal: LTG Patient will perform eating w/assist, cues/equip (OT) Description: LTG: Patient will perform eating with assist, with/without cues using equipment (OT) Flowsheets (Taken 08/31/2021 1218) LTG: Pt will perform eating with assistance level of: Set up assist    Problem: RH Grooming Goal: LTG Patient will perform grooming w/assist,cues/equip (OT) Description: LTG: Patient will perform grooming with assist, with/without cues using equipment (OT) Flowsheets (Taken 08/31/2021 1218) LTG: Pt will perform grooming with assistance level of: Set up assist    Problem: RH Bathing Goal: LTG Patient will bathe all body parts with assist levels (OT) Description: LTG: Patient will bathe all body parts with assist levels (OT) Flowsheets (Taken 08/31/2021 1218) LTG: Pt will perform bathing with assistance level/cueing: Supervision/Verbal cueing   Problem: RH Dressing Goal: LTG Patient will perform upper body dressing (OT) Description: LTG Patient will perform upper body dressing with assist, with/without cues (OT). Flowsheets (Taken 08/31/2021 1218) LTG: Pt will perform upper body dressing with assistance level of: Set up assist Goal: LTG Patient will  perform lower body dressing w/assist (OT) Description: LTG: Patient will perform lower body dressing with assist, with/without cues in positioning using equipment (OT) Flowsheets (Taken 08/31/2021 1218) LTG: Pt will perform lower body dressing with assistance level of: Supervision/Verbal cueing   Problem: RH Toileting Goal: LTG Patient will perform toileting task (3/3 steps) with assistance level (OT) Description: LTG: Patient will perform toileting task (3/3 steps) with assistance level (OT)  Flowsheets (Taken 08/31/2021 1218) LTG: Pt will perform toileting task (3/3 steps) with assistance level: Supervision/Verbal cueing   Problem: RH Toilet Transfers Goal: LTG Patient will perform toilet transfers w/assist (OT) Description: LTG: Patient will perform toilet transfers with assist, with/without cues using equipment (OT) Flowsheets (Taken 08/31/2021 1218) LTG: Pt will perform toilet transfers with assistance level of: Supervision/Verbal cueing   Problem: RH Tub/Shower Transfers Goal: LTG Patient will perform tub/shower transfers w/assist (OT) Description: LTG: Patient will perform tub/shower transfers with assist, with/without cues using equipment (OT) Flowsheets (Taken 08/31/2021 1218) LTG: Pt will perform tub/shower stall transfers with assistance level of: Supervision/Verbal cueing

## 2021-08-31 NOTE — Progress Notes (Signed)
Bilateral lower extremity venous duplex completed. Refer to "CV Proc" under chart review to view preliminary results.  08/31/2021 4:40 PM Kelby Aline., MHA, RVT, RDCS, RDMS

## 2021-08-31 NOTE — Progress Notes (Addendum)
Patient reported to be constipated--has not received any prn Senna, miralax, suppository or enema since admission yesterday. Will schedule Senna S 2tabs  daily and order KUB to determine stool burden. Note CXR 11/03 with progression of airspace disease --will check follow up CXR today.

## 2021-09-01 DIAGNOSIS — M4624 Osteomyelitis of vertebra, thoracic region: Secondary | ICD-10-CM

## 2021-09-01 DIAGNOSIS — R5381 Other malaise: Secondary | ICD-10-CM | POA: Diagnosis not present

## 2021-09-01 LAB — CBC WITH DIFFERENTIAL/PLATELET
Abs Immature Granulocytes: 0.06 10*3/uL (ref 0.00–0.07)
Basophils Absolute: 0 10*3/uL (ref 0.0–0.1)
Basophils Relative: 1 %
Eosinophils Absolute: 0.5 10*3/uL (ref 0.0–0.5)
Eosinophils Relative: 7 %
HCT: 26.9 % — ABNORMAL LOW (ref 39.0–52.0)
Hemoglobin: 8.5 g/dL — ABNORMAL LOW (ref 13.0–17.0)
Immature Granulocytes: 1 %
Lymphocytes Relative: 20 %
Lymphs Abs: 1.5 10*3/uL (ref 0.7–4.0)
MCH: 29 pg (ref 26.0–34.0)
MCHC: 31.6 g/dL (ref 30.0–36.0)
MCV: 91.8 fL (ref 80.0–100.0)
Monocytes Absolute: 0.9 10*3/uL (ref 0.1–1.0)
Monocytes Relative: 12 %
Neutro Abs: 4.5 10*3/uL (ref 1.7–7.7)
Neutrophils Relative %: 59 %
Platelets: 322 10*3/uL (ref 150–400)
RBC: 2.93 MIL/uL — ABNORMAL LOW (ref 4.22–5.81)
RDW: 18.6 % — ABNORMAL HIGH (ref 11.5–15.5)
WBC: 7.6 10*3/uL (ref 4.0–10.5)
nRBC: 0 % (ref 0.0–0.2)

## 2021-09-01 LAB — GLUCOSE, CAPILLARY
Glucose-Capillary: 153 mg/dL — ABNORMAL HIGH (ref 70–99)
Glucose-Capillary: 132 mg/dL — ABNORMAL HIGH (ref 70–99)
Glucose-Capillary: 182 mg/dL — ABNORMAL HIGH (ref 70–99)
Glucose-Capillary: 164 mg/dL — ABNORMAL HIGH (ref 70–99)
Glucose-Capillary: 205 mg/dL — ABNORMAL HIGH (ref 70–99)
Glucose-Capillary: 107 mg/dL — ABNORMAL HIGH (ref 70–99)

## 2021-09-01 MED ORDER — ALPRAZOLAM 0.25 MG PO TABS
0.2500 mg | ORAL_TABLET | Freq: Two times a day (BID) | ORAL | Status: DC | PRN
  Administered 2021-09-02 – 2021-09-21 (×15): 0.25 mg via ORAL
  Filled 2021-09-01 (×15): qty 1

## 2021-09-01 MED ORDER — OXYCODONE-ACETAMINOPHEN 5-325 MG PO TABS
1.0000 | ORAL_TABLET | ORAL | Status: DC | PRN
  Administered 2021-09-01 – 2021-09-04 (×6): 1 via ORAL
  Filled 2021-09-01 (×7): qty 1

## 2021-09-01 MED ORDER — SORBITOL 70 % SOLN
60.0000 mL | Freq: Once | Status: AC
  Administered 2021-09-01: 60 mL via ORAL
  Filled 2021-09-01: qty 60

## 2021-09-01 MED ORDER — LIDOCAINE HCL URETHRAL/MUCOSAL 2 % EX GEL
1.0000 "application " | Freq: Once | CUTANEOUS | Status: AC
  Administered 2021-09-03: 1 via TOPICAL

## 2021-09-01 MED ORDER — LIDOCAINE HCL URETHRAL/MUCOSAL 2 % EX GEL
1.0000 "application " | Freq: Four times a day (QID) | CUTANEOUS | Status: DC
  Administered 2021-09-01 – 2021-09-05 (×5): 1 via URETHRAL
  Filled 2021-09-01 (×8): qty 6

## 2021-09-01 NOTE — Progress Notes (Signed)
Patient ID: Jose [name], male   DOB: 10-19-1976, 45 y.o.   MRN: [id number] Met with pt to give update regarding team conference goals of supervision level and target discharge date -3 weeks. Pt thought he would be here one week. Discussed he needs to get moving and he will have pain when moving. MD is working on his pain and he reports he will push himself in therapies due to he wants to get home. His oldest daughter is flying in next week and he hopes to be home for this. He will need to work hard to achieve this.

## 2021-09-01 NOTE — Patient Care Conference (Signed)
Inpatient RehabilitationTeam Conference and Plan of Care Update Date: 09/01/2021   Time: 11:35 AM    Patient Name: Jose [name]      Medical Record Number: [id number]  Date of Birth: July 31, 1976 Sex: Male         Room/Bed: 4M06C/4M06C-01 Payor Info: Payor: Theme park manager / Plan: Theme park manager OTHER / Product Type: *No Product type* /    Admit Date/Time:  08/30/2021  3:23 PM  Primary Diagnosis:  <principal problem not specified>  Hospital Problems: Active Problems:   Debility    Expected Discharge Date: Expected Discharge Date:  (3 weeks)  Team Members Present: Physician leading conference: Dr. Courtney Heys Social Worker Present: Ovidio Kin, LCSW Nurse Present: Dorthula Nettles, RN PT Present: Ailene Rud, PT OT Present: Lillia Corporal, OT PPS Coordinator present : Gunnar Fusi, SLP     Current Status/Progress Goal Weekly Team Focus  Bowel/Bladder   Foley removed on 11/7. Has not voided yet. cath q4-6hours  Begin voiding trials  assess toileting needs qshift. Scan q4-6 and cath for volumes >346ml   Swallow/Nutrition/ Hydration             ADL's   Max-total all ADLs (based on caretool), pain significnatly limiting, unable to get OOB at eval  Supervision  OOB tolerance, ADL transfers, pain management, ADL retraining   Mobility   max-+2 assist for all mobility per caretool, pt has been unable to perform OOB mobility d/t pain  supervision overall, CGA stairs  LE strength, pain management   Communication             Safety/Cognition/ Behavioral Observations            Pain   Back pain managed to 5 out of 10 with PRN Percocet  <3 out of 10  Assess pain q shift/PRN and treat PRN   Skin   unstagable to sacrum- santyl ordered, back incision OTA pink  No new skin breakdown. No new signs of infection  assess skin q shift and PRN. Treat wounds per order     Discharge Planning:  Home with wife who works from home, but does pick up kids from school   Team  Discussion: Foley discontinued. Sorbitol ordered for constipation. Multiple spinal fractures, on 2L, O2. Stage 2 coccyx, bilateral DTI's on buttocks, diabetic toe ulcer. IV abx. Home with spouse who works from home. Max/total assist currently, supervision goals. Will downgrade goals if needed. Schedule with neuro-psych.  Patient on target to meet rehab goals: Currently pain/anxiety is largest barrier.  *See Care Plan and progress notes for long and short-term goals.   Revisions to Treatment Plan:  Adjusting medications  Teaching Needs: Family education, medication/pain/anxiety management, bladder training education, constipation management, skin/wound care, transfer training, safety awareness, etc.  Current Barriers to Discharge: Decreased caregiver support, Home enviroment access/layout, IV antibiotics, Incontinence, Neurogenic bowel and bladder, Wound care, Lack of/limited family support, Weight, Weight bearing restrictions, Medication compliance, New oxygen, and pain/anxiety.  Possible Resolutions to Barriers: Family education HH/Outpatient therapy set-up IV abx education Timed toileting schedule Neuro-psych follow-up      Medical Summary Current Status: limitation neurogenic bladde-r urinary retention/pain; 2L O2- which is new; LBM 11/2- Stage II on coccyx- B/L DTIs on buttocks-  Barriers to Discharge: New oxygen;Decreased family/caregiver support;Home enviroment access/layout;Neurogenic Bowel & Bladder;Weight;Wound care;Medical stability;IV antibiotics;Weight bearing restrictions  Barriers to Discharge Comments: pain is biggest limitations- multiple spinal fx's- also on IV ABX til after d/c. Possible Resolutions to [employer] Focus: total Assist at this  time- focus- pain mgmt- increased oxy to q4 hours prn and added Xanax 02.5 mg BID prn- clean him out- d/c - 3 weeks for now   Continued Need for Acute Rehabilitation Level of Care: The patient requires daily medical management  by a physician with specialized training in physical medicine and rehabilitation for the following reasons: Direction of a multidisciplinary physical rehabilitation program to maximize functional independence : Yes Medical management of patient stability for increased activity during participation in an intensive rehabilitation regime.: Yes Analysis of laboratory values and/or radiology reports with any subsequent need for medication adjustment and/or medical intervention. : Yes   I attest that I was present, lead the team conference, and concur with the assessment and plan of the team.   Cristi Loron 09/01/2021, 5:34 PM

## 2021-09-01 NOTE — Progress Notes (Signed)
PROGRESS NOTE   Subjective/Complaints:  Pt reports still has thrush- makes things taste weird.   Bowel OK- LBM 2 days ago- is normal for him to go q2-3 days.   Wearing O2- is new since hospitalized.   Foley got removed- not peeing out. Very painful- thinks allergic to lidocaine-= but was blister with patches. Will do allergy test   LBM 4+ days ago- recorded as 11/3.  Will give sorbitol.   Blood tinged with cathing this AM.   ROS:  Pt denies SOB, abd pain, CP, N/V/C/D, and vision changes    Objective:   DG Chest 2 View  Result Date: 08/31/2021 CLINICAL DATA:  Constipation, follow-up exam Hx diabetes Patient reports not having a bowel movement in the past 2 days EXAM: ABDOMEN - 1 VIEW; CHEST - 2 VIEW COMPARISON: CHEST X-RAY: COMPARISON: CHEST X-RAY 08/27/2021, CT chest 08/06/2021 FINDINGS: Right PICC with tip overlying distal superior vena cava. The heart and mediastinal contours are within normal limits. Prominence of the hilar vasculature. Airspace opacity of the left upper left lower lobes. Increased interstitial markings. No pleural effusion. No pneumothorax. The bowel gas pattern is normal. No radio-opaque calculi or other significant radiographic abnormality are seen. Vascular calcifications within the pelvis. No acute osseous abnormality. IMPRESSION: 1. Pulmonary edema with likely persistent left lung infection/inflammation. 2. Nonobstructive bowel gas pattern. Electronically Signed   By: Iven Finn M.D.   On: 08/31/2021 15:32   DG Abd 1 View  Result Date: 08/31/2021 CLINICAL DATA:  Constipation, follow-up exam Hx diabetes Patient reports not having a bowel movement in the past 2 days EXAM: ABDOMEN - 1 VIEW; CHEST - 2 VIEW COMPARISON: CHEST X-RAY: COMPARISON: CHEST X-RAY 08/27/2021, CT chest 08/06/2021 FINDINGS: Right PICC with tip overlying distal superior vena cava. The heart and mediastinal contours are within  normal limits. Prominence of the hilar vasculature. Airspace opacity of the left upper left lower lobes. Increased interstitial markings. No pleural effusion. No pneumothorax. The bowel gas pattern is normal. No radio-opaque calculi or other significant radiographic abnormality are seen. Vascular calcifications within the pelvis. No acute osseous abnormality. IMPRESSION: 1. Pulmonary edema with likely persistent left lung infection/inflammation. 2. Nonobstructive bowel gas pattern. Electronically Signed   By: Iven Finn M.D.   On: 08/31/2021 15:32   VAS [place] LOWER EXTREMITY VENOUS (DVT)  Result Date: 09/01/2021  Lower Venous DVT Study Patient Name:  LOY MCCARTT  Date of Exam:   08/31/2021 Medical Rec #: [id number]        Accession #:    [id number] Date of Birth: August 12, 1976        Patient Gender: M Patient Age:   45 years Exam Location:  [facility] Procedure:      VAS [place] LOWER EXTREMITY VENOUS (DVT) Referring Phys: PAMELA LOVE --------------------------------------------------------------------------------  Indications: Edema.  Comparison Study: 08/21/2021- Negative lower extremity venous duplex Performing Technologist: Maudry Mayhew MHA, RDMS, RVT, RDCS  Examination Guidelines: A complete evaluation includes B-mode imaging, spectral Doppler, color Doppler, and power Doppler as needed of all accessible portions of each vessel. Bilateral testing is considered an integral part of a complete examination. Limited examinations for reoccurring indications may be performed as  noted. The reflux portion of the exam is performed with the patient in reverse Trendelenburg.  +---------+---------------+---------+-----------+----------+--------------+ RIGHT    CompressibilityPhasicitySpontaneityPropertiesThrombus Aging +---------+---------------+---------+-----------+----------+--------------+ CFV      Full           Yes      Yes                                  +---------+---------------+---------+-----------+----------+--------------+ SFJ      Full                                                        +---------+---------------+---------+-----------+----------+--------------+ FV Prox  Full                                                        +---------+---------------+---------+-----------+----------+--------------+ FV Mid   Full                                                        +---------+---------------+---------+-----------+----------+--------------+ FV DistalFull                                                        +---------+---------------+---------+-----------+----------+--------------+ PFV      Full                                                        +---------+---------------+---------+-----------+----------+--------------+ POP      Full           Yes      Yes                                 +---------+---------------+---------+-----------+----------+--------------+ PTV      Full                                                        +---------+---------------+---------+-----------+----------+--------------+ PERO     Full                                                        +---------+---------------+---------+-----------+----------+--------------+   +---------+---------------+---------+-----------+----------+--------------+ LEFT     CompressibilityPhasicitySpontaneityPropertiesThrombus Aging +---------+---------------+---------+-----------+----------+--------------+ CFV      Full           Yes  Yes                                 +---------+---------------+---------+-----------+----------+--------------+ SFJ      Full                                                        +---------+---------------+---------+-----------+----------+--------------+ FV Prox  Full                                                         +---------+---------------+---------+-----------+----------+--------------+ FV Mid   Full                                                        +---------+---------------+---------+-----------+----------+--------------+ FV DistalFull                                                        +---------+---------------+---------+-----------+----------+--------------+ PFV      Full                                                        +---------+---------------+---------+-----------+----------+--------------+ POP      Full           Yes      Yes                                 +---------+---------------+---------+-----------+----------+--------------+ PTV      Full                                                        +---------+---------------+---------+-----------+----------+--------------+ PERO     Full                                                        +---------+---------------+---------+-----------+----------+--------------+     Summary: BILATERAL: - No evidence of deep vein thrombosis seen in the lower extremities, bilaterally. -No evidence of popliteal cyst, bilaterally.   *See table(s) above for measurements and observations. Electronically signed by Deitra Mayo MD on 09/01/2021 at 7:35:46 AM.    Final    Recent Labs    08/31/21 1128 09/01/21 0412  WBC 9.2 7.6  HGB 8.6* 8.5*  HCT 28.1* 26.9*  PLT 357 322  Recent Labs    08/31/21 0359 08/31/21 1128  NA 136 136  K 3.5 3.6  CL 108 108  CO2 20* 20*  GLUCOSE 108* 214*  BUN 37* 35*  CREATININE 1.34* 1.35*  CALCIUM 8.4* 8.2*    Intake/Output Summary (Last 24 hours) at 09/01/2021 1057 Last data filed at 09/01/2021 3335 Gross per 24 hour  Intake 595 ml  Output 2200 ml  Net -1605 ml     Pressure Injury 08/10/21 Coccyx Medial Stage 2 -  Partial thickness loss of dermis presenting as a shallow open injury with a red, pink wound bed without slough. (Active)  08/10/21 1700  Location:  Coccyx (gluteal crease)  Location Orientation: Medial  Staging: Stage 2 -  Partial thickness loss of dermis presenting as a shallow open injury with a red, pink wound bed without slough.  Wound Description (Comments):   Present on Admission: Yes     Pressure Injury 08/14/21 Buttocks Bilateral Deep Tissue Pressure Injury - Purple or maroon localized area of discolored intact skin or blood-filled blister due to damage of underlying soft tissue from pressure and/or shear. DTI (Active)  08/14/21 0800  Location: Buttocks  Location Orientation: Bilateral  Staging: Deep Tissue Pressure Injury - Purple or maroon localized area of discolored intact skin or blood-filled blister due to damage of underlying soft tissue from pressure and/or shear.  Wound Description (Comments): DTI  Present on Admission:     Physical Exam: Vital Signs Blood pressure (!) 145/71, pulse 74, temperature 98.7 F (37.1 C), resp. rate 14, height 5\' 11"  (1.803 m), weight 92.7 kg, SpO2 96 %.     General: awake, alert, appropriate, supine in bed; asking for more pain meds due to pain limiting him;  NAD HENT: conjugate gaze; oropharynx moist CV: regular rate; no JVD Pulmonary: CTA B/L; no W/R/R- good air movement- O2 by [place]  GI: soft, NT, ND, (+)BS Psychiatric: appropriate- interactive Neurological: Ox3  motor strength is 5/5 in bilateral deltoid, bicep, tricep, grip, 4-/5 hip flexor, knee extensors, 3/5 ankle dorsiflexor and plantar flexor Sensory exam reduced LT and Proprioception bilateral feet  Assessment/Plan: 1. Functional deficits which require 3+ hours per day of interdisciplinary therapy in a comprehensive inpatient rehab setting. Physiatrist is providing close team supervision and 24 hour management of active medical problems listed below. Physiatrist and rehab team continue to assess barriers to discharge/monitor patient progress toward functional and medical goals  Care Tool:  Bathing    Body parts  bathed by patient: Right arm, Left arm, Chest, Abdomen, Front perineal area, Buttocks, Face   Body parts bathed by helper: Right upper leg, Left upper leg, Right lower leg, Left lower leg     Bathing assist Assist Level: Maximal Assistance - Patient 24 - 49%     Upper Body Dressing/Undressing Upper body dressing   What is the patient wearing?: Pull over shirt    Upper body assist Assist Level: Maximal Assistance - Patient 25 - 49%    Lower Body Dressing/Undressing Lower body dressing      What is the patient wearing?: Pants, Underwear/pull up     Lower body assist Assist for lower body dressing: Maximal Assistance - Patient 25 - 49%     Toileting Toileting Toileting Activity did not occur (Clothing management and hygiene only): N/A (no void or bm) (dependent for cath)  Toileting assist Assist for toileting: 2 Helpers     Transfers Chair/bed transfer  Transfers assist  Chair/bed transfer activity did not occur: Safety/medical concerns  Chair/bed transfer assist level: 2 Helpers     Locomotion Ambulation   Ambulation assist   Ambulation activity did not occur: Safety/medical concerns          Walk 10 feet activity   Assist  Walk 10 feet activity did not occur: Safety/medical concerns        Walk 50 feet activity   Assist Walk 50 feet with 2 turns activity did not occur: Safety/medical concerns         Walk 150 feet activity   Assist Walk 150 feet activity did not occur: Safety/medical concerns         Walk 10 feet on uneven surface  activity   Assist Walk 10 feet on uneven surfaces activity did not occur: Safety/medical concerns         Wheelchair     Assist Is the patient using a wheelchair?: Yes Type of Wheelchair: Manual Wheelchair activity did not occur: Safety/medical concerns         Wheelchair 50 feet with 2 turns activity    Assist    Wheelchair 50 feet with 2 turns activity did not occur: Safety/medical  concerns       Wheelchair 150 feet activity     Assist  Wheelchair 150 feet activity did not occur: Safety/medical concerns       Blood pressure (!) 145/71, pulse 74, temperature 98.7 F (37.1 C), resp. rate 14, height 5\' 11"  (1.803 m), weight 92.7 kg, SpO2 96 %.    Medical Problem List and Plan: 1.  Decline in self care and mobility  secondary to thoracic myelopathy/thoracic osteomyelitis.              -patient may  shower             -ELOS/Goals: 10-12d  Con't PT and OT_ dteam conference today- d/c 3 weeks or so since pain so limiting.  2.  Antithrombotics: -DVT/anticoagulation:  N/A             -antiplatelet therapy: none 3. Pain Management: Oxycodone prn. 11/8- will increase oxy to q4 hours prn- might need long acting?  4. Mood: LCSW to follow for evaluation and support.              -antipsychotic agents: N/A 5. Neuropsych: This patient is intermittently capable of making decisions on his own behalf. 6. Skin/Wound Care: Air mattress for pressure relief measures.  7. Fluids/Electrolytes/Nutrition: Monitor I/O. Check lytes in am.  8. MSSA bacteremia: On ancef with end date 12/01 9. UGIB/melena: Has received 11 units PRBC during admission --Continue protonix BID w/ carafate ac/hs.  --Monitor for signs of bleeding. 10. T2DM: Po intake poor --continue TF at nights. Continue insulin gargline 12 units at nights --monitor BS every 4 hours with SSI 11/7- BG's look great- still poor appetite, but I think thrush is making it worse- things "taste weird". Con't to monitor and regimen  11/8- change BG's to qacHS 11. HTN: Monitor BP TID--continue Norvasc and metoprolol for control.   11/7- BP somewhat elevated in 578I- systolic- will monitor for trend.  12. ABLA: On aranesp weekly for supplement.  11/7- Hb stable at 8.4- con't to monitor 13. Acute renal insufficiency- 11/7- improving- Cr down to 1.37 and BUN down to 37-   14. O2 need/acute resp failure  11/7- will wean O2 as  able 15. Dispo  11/7- wife left note asking for "CNS w/u and endo work up as well as some more esoteric dx's"- will need to  discuss with her before order any work up.    16. Urinary retention- 11/8- will start Flomax- foley out- in/out caths- will do allergy test to see if use lidocaine jelly- if so, order.    I spent a total of 42 minutes on total care- >50% coordination of care- team conference; as well as speaking with nurse and pharmacy about lidocaine- to make sure no allergic reaction-   LOS: 2 days A FACE TO FACE EVALUATION WAS PERFORMED  Sneijder Bernards 09/01/2021, 10:57 AM

## 2021-09-01 NOTE — Progress Notes (Signed)
Occupational Therapy Session Note  Patient Details  Name: Jose [name] MRN: [id number] Date of Birth: [birth year]  Today's Date: 09/01/2021 OT Individual Time: 6222-9798 OT Individual Time Calculation (min): 54 min    Short Term Goals: Week 1:  OT Short Term Goal 1 (Week 1): Pt will tolerate sitting EOB ~10 minutes during an ADL session to demonstrate improvement in pain mgt OT Short Term Goal 2 (Week 1): Pt will complete 1/3 components of donning pants with AE as needed OT Short Term Goal 3 (Week 1): Pt will complete LB dressing at sit<stand level using LRAD  Skilled Therapeutic Interventions/Progress Updates:    Pt greeted at time of session supine in bed resting with HOB elevated, pt in constant pain throughout session mostly in R scapula/shoulder and upper back area, ranked 8/10 at worst point with mobility but pt premedicated just prior to session. Focus of session initially on rapport building as first encounter and decreasing anxiety with mobility and therapy in general. Checked orders for binder/corset which has been placed but not in room yet, checked with RN as well but not present. With 2nd helper, supine > sit EOB and shaking in all four limbs uncontrollably with pt reports 2/2 pain, able to perform 1x10 kick outs for each LE seated, attempted several different positions and support but no relief, able to sit EOB approx 5-7 mins. Returned to supine 2 helpers and focused remainder of session on BLE ROM for 2x10 single leg lifts, 2x10 hip/knee flexion with resistance. BUE ROM for 2x20 shoulder presses in semireclined position, 1x10 R shoulder abduction as well with distal facilitation. Note pain limiting throughout entire session but encouraged pt to participate. Pt scooted up in bed 2 helpers, alarm on call bell in reach.    Therapy Documentation Precautions:  Precautions Precautions: Fall, Back Precaution Booklet Issued:  (verbally reviewed post-up back precautions) Precaution  Comments: 2L oxygen, high pain levels, PICC line Restrictions Weight Bearing Restrictions: No    Therapy/Group: Individual Therapy  Viona Gilmore 09/01/2021, 1:59 PM

## 2021-09-01 NOTE — Progress Notes (Signed)
Physical Therapy Session Note  Patient Details  Name: ROBEN SCHLIEP MRN: [id number] Date of Birth: [birth year]  Today's Date: 09/01/2021 PT Individual Time: 1100-1125, 1436-1530 PT Individual Time Calculation (min): 25 min, 54 min  Short Term Goals: Week 1:  PT Short Term Goal 1 (Week 1): Pt will complete bed mobility with minA and LRAD PT Short Term Goal 2 (Week 1): Pt will complete bed<>chair transfers with modA and LRAD PT Short Term Goal 3 (Week 1): Pt will tolerate sitting in recliner or TIS w/c for > 2 hours outside of therapies PT Short Term Goal 4 (Week 1): Pt will ambulate 63ft with modA and LRAD  Skilled Therapeutic Interventions/Progress Updates:    Session 1: pt received in bed and agreeable to therapy, reporting 7/10 at the R shoulder blade and surgical site. Pt declined OOB activity, deferring to PM session. Pt performed the following bed level exercises to promote LE strength and endurance:  -heel slides 4 x 10 BIL -alternating SLR 4 x 10 -BKFO 3 x 10 -supine abduction 4 x 10 -supine triple extension against manual resistance. Pt expressed fatigue throughout session with bed level exercises. Pt remained in bed at end of session and was left with all needs in reach, nsg present for dressing change.  Session 2: pt received in bed and agreeable to therapy. Pt reports 5/10 pain that rose to 7/10 with activity, pt was premedicated, diaphragmatic breathing and pain neuroscience education provided for holistic pain management. Pt performed supine "squats" against manual resistance, 2 x 6 for LE strength. When discussing sitting up on EOB, pt's anxiety level spiked. Addressed with diaphragmatic breathing to increase parasympathetic activity and music for grounding benefit. Pt then performed supine<>sit with +2 assist to maintain back precautions and manage BLE.  Pt able to sit up ~30 sec. Reported dizziness in sitting that did not improve with deep breathing to increase venous  return, so returned to supine. Pt then performed supine rows with resistance band to improve UE endurance. At this time, therapist provided pain neuroscience education and psychosocial support, encouraging pt to utilize deep breathing, grounding techniques, music, gratitude to assist in managing anxiety. Pt demonstrated improved mood after conversation and remained in bed, was left with all needs in reach and alarm active.   Therapy Documentation Precautions:  Precautions Precautions: Fall, Back Precaution Booklet Issued:  (verbally reviewed post-up back precautions) Precaution Comments: 2L oxygen, high pain levels, PICC line Restrictions Weight Bearing Restrictions: No General:   Vital Signs: Therapy Vitals Temp: 98.7 F (37.1 C) Pulse Rate: 74 Resp: 14 BP: (!) 145/71 Patient Position (if appropriate): Lying Oxygen Therapy SpO2: 96 % O2 Device: Room Air Pain:   Mobility:   Locomotion :    Trunk/Postural Assessment :    Balance:   Exercises:   Other Treatments:      Therapy/Group: Individual Therapy  [name] 09/01/2021, 7:57 AM

## 2021-09-01 NOTE — Progress Notes (Signed)
Patient unable to void this shift. Patient cathed for 750ml at approximately 0130 and 488ml at 0640. No bowel movement this shift. Scheduled senna and PRN miralax given this morning. Patient refusing fleets at this time.

## 2021-09-01 NOTE — Progress Notes (Addendum)
Occupational Therapy Session Note  Patient Details  Name: Jose [name] MRN: [id number] Date of Birth: 26-Jan-1976  Today's Date: 09/01/2021 OT Individual Time: 4827-0786 OT Individual Time Calculation (min): 58 min    Short Term Goals: Week 1:  OT Short Term Goal 1 (Week 1): Pt will tolerate sitting EOB ~10 minutes during an ADL session to demonstrate improvement in pain mgt OT Short Term Goal 2 (Week 1): Pt will complete 1/3 components of donning pants with AE as needed OT Short Term Goal 3 (Week 1): Pt will complete LB dressing at sit<stand level using LRAD  Skilled Therapeutic Interventions/Progress Updates:  Pt greeted supine in bed  agreeable to OT intervention. Session focus on BADL reeducation, education related to incentive spirometer and BUE therex to increase strength for higher level functional mobility tasks. Pt with questions about toileting with OTA recommending urinal and bed pan at this point secondary to pain, retrieved bed pan and discussed positioning for using bed pan, pt able to roll to L side only with CGA to simulate placing bed pan. Discussed posterior pericare options with eduction provided on AE for posterior pericare including toileting aid in order to maintain back precautions. Pt able to state 2/3 back precautions. Discussed compensatory methods of all ADLs in order to maintain back precautions such as using reacher for LB dressing ( pt has reacher at home). Issued pt handout on IS and provided pt with check off sheet in order to encourage use of IS every hour 10x. Pt able to pull 600 mL consistently during session. Additionally issued pt BUE therex HEP with level 3 tubing. Pt completed therex as indicted below with compensatory methods provided as pt with increased pain in R scapula.  X10 bicep curls X10 chest pulls X10 shoulder flexion X10 tricep rows ( LUE only) X10 punches ( LUE only ) Issued pt HEP to increase carryover.  pt left  supine in bed with all need  within reach and bed alarm activated.                      Pt on 2L [place] during session with SpO2 >93%, pt able to state appropriate SpO2 levels.   Therapy Documentation Precautions:  Precautions Precautions: Fall, Back Precaution Booklet Issued:  (verbally reviewed post-up back precautions) Precaution Comments: 2L oxygen, high pain levels, PICC line Restrictions Weight Bearing Restrictions: No  Pain: pt reports 7/10 pain, provided rest breaks and compensatory methods as needed during session     Therapy/Group: Individual Therapy  [name] [facility] 09/01/2021, 12:01 PM

## 2021-09-01 NOTE — Progress Notes (Signed)
Physical Therapy Session Note  Patient Details  Name: Jose [name] MRN: [id number] Date of Birth: 07-11-1976  Today's Date: 09/01/2021 PT Individual Time: 9323-5573 PT Individual Time Calculation (min): 27 min   Short Term Goals: Week 1:  PT Short Term Goal 1 (Week 1): Pt will complete bed mobility with minA and LRAD PT Short Term Goal 2 (Week 1): Pt will complete bed<>chair transfers with modA and LRAD PT Short Term Goal 3 (Week 1): Pt will tolerate sitting in recliner or TIS w/c for > 2 hours outside of therapies PT Short Term Goal 4 (Week 1): Pt will ambulate 80ft with modA and LRAD  Skilled Therapeutic Interventions/Progress Updates:    Pt received supine in bed and agreeable to therapy session, but reports only receiving pain medication ~15-11minutes prior to therapist arrival. Reports pain level 7-8/10 at rest in the bed - premedicated and modified therapy session for pain management. Received and maintained on 2L of O2 via nasal cannula - SpO2 95%. Pt sweating on face and head throughout session - was utilizing a towel prior to therapist's arrival. Donned B LE TED hose total assist for time management - educated on wear schedule. Educated pt on importance of performing B LE ankle DF/PF AROM daily - completed x10 reps each LE. Trained pt on performing ankle plantarflexor stretch using gait belt - performed 1x 4minute per LE - educated pt on performing this daily with his wife's assistance outside of therapy sessions. Pt reports he has hx of anxiety and is concerned about his LOS and progress - educated pt on weekly team conferences to discuss his progress towards LTGs to determine ELOS and anticipated D/C date. Rolling L/R in bed relying heavily on bedrails with min assist to only partially roll hips and moving very slow, cautiously due to increased pain while therapist assisted with donning LB clothing total assist. Performed x10reps using incentive spirometer with focus on rising piston to  1000 while sustaining float between the arrows with pt unable to sustain inhalation long enough to rise piston higher than 750. At end of session, pt left supine in bed with needs in reach, lines intact, and bed alarm on.  Therapy Documentation Precautions:  Precautions Precautions: Fall, Back Precaution Booklet Issued:  (verbally reviewed post-up back precautions) Precaution Comments: 2L oxygen, high pain levels, PICC line Restrictions Weight Bearing Restrictions: No   Pain: Reports 7-8/10 in back - details above - premedicated and modified interventions for pain management.    Therapy/Group: Individual Therapy  Tawana Scale , PT, DPT, NCS, CSRS  09/01/2021, 7:56 AM

## 2021-09-02 ENCOUNTER — Encounter (HOSPITAL_COMMUNITY): Payer: Self-pay | Admitting: Physical Medicine and Rehabilitation

## 2021-09-02 DIAGNOSIS — M4714 Other spondylosis with myelopathy, thoracic region: Secondary | ICD-10-CM | POA: Diagnosis present

## 2021-09-02 DIAGNOSIS — M4624 Osteomyelitis of vertebra, thoracic region: Secondary | ICD-10-CM | POA: Diagnosis not present

## 2021-09-02 DIAGNOSIS — R5381 Other malaise: Secondary | ICD-10-CM | POA: Diagnosis not present

## 2021-09-02 LAB — CBC WITH DIFFERENTIAL/PLATELET
Abs Immature Granulocytes: 0.03 10*3/uL (ref 0.00–0.07)
Abs Immature Granulocytes: 0.04 10*3/uL (ref 0.00–0.07)
Basophils Absolute: 0 10*3/uL (ref 0.0–0.1)
Basophils Absolute: 0 10*3/uL (ref 0.0–0.1)
Basophils Relative: 1 %
Basophils Relative: 1 %
Eosinophils Absolute: 0.5 10*3/uL (ref 0.0–0.5)
Eosinophils Absolute: 0.5 10*3/uL (ref 0.0–0.5)
Eosinophils Relative: 7 %
Eosinophils Relative: 8 %
HCT: 24.2 % — ABNORMAL LOW (ref 39.0–52.0)
HCT: 25.7 % — ABNORMAL LOW (ref 39.0–52.0)
Hemoglobin: 7.6 g/dL — ABNORMAL LOW (ref 13.0–17.0)
Hemoglobin: 8.2 g/dL — ABNORMAL LOW (ref 13.0–17.0)
Immature Granulocytes: 1 %
Immature Granulocytes: 1 %
Lymphocytes Relative: 21 %
Lymphocytes Relative: 19 %
Lymphs Abs: 1.3 10*3/uL (ref 0.7–4.0)
Lymphs Abs: 1.2 10*3/uL (ref 0.7–4.0)
MCH: 28.9 pg (ref 26.0–34.0)
MCH: 29.3 pg (ref 26.0–34.0)
MCHC: 31.4 g/dL (ref 30.0–36.0)
MCHC: 31.9 g/dL (ref 30.0–36.0)
MCV: 92 fL (ref 80.0–100.0)
MCV: 91.8 fL (ref 80.0–100.0)
Monocytes Absolute: 0.9 10*3/uL (ref 0.1–1.0)
Monocytes Absolute: 1 10*3/uL (ref 0.1–1.0)
Monocytes Relative: 15 %
Monocytes Relative: 15 %
Neutro Abs: 3.6 10*3/uL (ref 1.7–7.7)
Neutro Abs: 3.5 10*3/uL (ref 1.7–7.7)
Neutrophils Relative %: 55 %
Neutrophils Relative %: 56 %
Platelets: 279 10*3/uL (ref 150–400)
Platelets: 278 10*3/uL (ref 150–400)
RBC: 2.63 MIL/uL — ABNORMAL LOW (ref 4.22–5.81)
RBC: 2.8 MIL/uL — ABNORMAL LOW (ref 4.22–5.81)
RDW: 18.1 % — ABNORMAL HIGH (ref 11.5–15.5)
RDW: 18.2 % — ABNORMAL HIGH (ref 11.5–15.5)
WBC: 6.3 10*3/uL (ref 4.0–10.5)
WBC: 6.2 10*3/uL (ref 4.0–10.5)
nRBC: 0 % (ref 0.0–0.2)
nRBC: 0 % (ref 0.0–0.2)

## 2021-09-02 LAB — BASIC METABOLIC PANEL
Anion gap: 7 (ref 5–15)
BUN: 28 mg/dL — ABNORMAL HIGH (ref 6–20)
CO2: 21 mmol/L — ABNORMAL LOW (ref 22–32)
Calcium: 7.9 mg/dL — ABNORMAL LOW (ref 8.9–10.3)
Chloride: 105 mmol/L (ref 98–111)
Creatinine, Ser: 1.31 mg/dL — ABNORMAL HIGH (ref 0.61–1.24)
GFR, Estimated: >60 mL/min (ref >60)
Glucose, Bld: 217 mg/dL — ABNORMAL HIGH (ref 70–99)
Potassium: 3.6 mmol/L (ref 3.5–5.1)
Sodium: 133 mmol/L — ABNORMAL LOW (ref 135–145)

## 2021-09-02 LAB — GLUCOSE, CAPILLARY
Glucose-Capillary: 80 mg/dL (ref 70–99)
Glucose-Capillary: 145 mg/dL — ABNORMAL HIGH (ref 70–99)
Glucose-Capillary: 187 mg/dL — ABNORMAL HIGH (ref 70–99)

## 2021-09-02 MED ORDER — CHLORHEXIDINE GLUCONATE CLOTH 2 % EX PADS
6.0000 | MEDICATED_PAD | Freq: Every day | CUTANEOUS | Status: DC
  Administered 2021-09-04 – 2021-09-20 (×18): 6 via TOPICAL

## 2021-09-02 MED ORDER — MIRTAZAPINE 15 MG PO TABS
15.0000 mg | ORAL_TABLET | Freq: Every day | ORAL | Status: DC
  Administered 2021-09-02 – 2021-09-20 (×19): 15 mg via ORAL
  Filled 2021-09-02 (×19): qty 1

## 2021-09-02 MED ORDER — FUROSEMIDE 40 MG PO TABS
40.0000 mg | ORAL_TABLET | Freq: Every day | ORAL | Status: AC
  Administered 2021-09-02 – 2021-09-04 (×3): 40 mg via ORAL
  Filled 2021-09-02 (×3): qty 1

## 2021-09-02 MED ORDER — POTASSIUM CHLORIDE CRYS ER 20 MEQ PO TBCR
40.0000 meq | EXTENDED_RELEASE_TABLET | Freq: Every day | ORAL | Status: AC
  Administered 2021-09-02 – 2021-09-04 (×3): 40 meq via ORAL
  Filled 2021-09-02 (×3): qty 2

## 2021-09-02 MED ORDER — MORPHINE SULFATE ER 15 MG PO TBCR
15.0000 mg | EXTENDED_RELEASE_TABLET | Freq: Two times a day (BID) | ORAL | Status: DC
  Administered 2021-09-02 – 2021-09-21 (×39): 15 mg via ORAL
  Filled 2021-09-02 (×39): qty 1

## 2021-09-02 NOTE — Progress Notes (Signed)
Follow up labs show H/H back up to 8.2 and BMET still pending. Will recheck H/H in am.

## 2021-09-02 NOTE — Progress Notes (Signed)
Occupational Therapy Session Note  Patient Details  Name: Jose [name] MRN: [id number] Date of Birth: 1976-07-30  Today's Date: 09/02/2021 OT Individual Time: 1224-8250 OT Individual Time Calculation (min): 28 min    Short Term Goals: Week 1:  OT Short Term Goal 1 (Week 1): Pt will tolerate sitting EOB ~10 minutes during an ADL session to demonstrate improvement in pain mgt OT Short Term Goal 2 (Week 1): Pt will complete 1/3 components of donning pants with AE as needed OT Short Term Goal 3 (Week 1): Pt will complete LB dressing at sit<stand level using LRAD   Skilled Therapeutic Interventions/Progress Updates:    Pt greeted at time of session supine in bed and agreeable to OT session but c/o 6/10 pain, premedicated per the pt. Given short session, pt declining sitting EOB or OOB activity. Focused on bed level BUE ROM with 2# dowel for chest press and overhead raises 2x10. Also discussing nature of R shoulder surgery that the pt underwent and pt indicates this was a biopsy closer to scapula/spine which is where his pain has been focused. Donned TEDS for the pt as well dependently in prep for next session and 2/2 mild edema in B feet. Alarm on call bell in reach.   Therapy Documentation Precautions:  Precautions Precautions: Fall, Back Precaution Booklet Issued:  (verbally reviewed post-up back precautions) Precaution Comments: 2L oxygen, high pain levels, PICC line Restrictions Weight Bearing Restrictions: No     Therapy/Group: Individual Therapy  Viona Gilmore 09/02/2021, 7:25 AM

## 2021-09-02 NOTE — Progress Notes (Signed)
PROGRESS NOTE   Subjective/Complaints:  Had large BM last night after Sorbitol- was a large stool.   Also, not sleeping well- gets in his head- Trazodone hasn't really helped much- just ruminates.   Pain is a little better, but still so bad, didn't get OOB much yesterday with therapy.  Really limiting     ROS:   Pt denies SOB, abd pain, CP, N/V/C/D, and vision changes    Objective:   DG Chest 2 View  Result Date: 08/31/2021 CLINICAL DATA:  Constipation, follow-up exam Hx diabetes Patient reports not having a bowel movement in the past 2 days EXAM: ABDOMEN - 1 VIEW; CHEST - 2 VIEW COMPARISON: CHEST X-RAY: COMPARISON: CHEST X-RAY 08/27/2021, CT chest 08/06/2021 FINDINGS: Right PICC with tip overlying distal superior vena cava. The heart and mediastinal contours are within normal limits. Prominence of the hilar vasculature. Airspace opacity of the left upper left lower lobes. Increased interstitial markings. No pleural effusion. No pneumothorax. The bowel gas pattern is normal. No radio-opaque calculi or other significant radiographic abnormality are seen. Vascular calcifications within the pelvis. No acute osseous abnormality. IMPRESSION: 1. Pulmonary edema with likely persistent left lung infection/inflammation. 2. Nonobstructive bowel gas pattern. Electronically Signed   By: Iven Finn M.D.   On: 08/31/2021 15:32   DG Abd 1 View  Result Date: 08/31/2021 CLINICAL DATA:  Constipation, follow-up exam Hx diabetes Patient reports not having a bowel movement in the past 2 days EXAM: ABDOMEN - 1 VIEW; CHEST - 2 VIEW COMPARISON: CHEST X-RAY: COMPARISON: CHEST X-RAY 08/27/2021, CT chest 08/06/2021 FINDINGS: Right PICC with tip overlying distal superior vena cava. The heart and mediastinal contours are within normal limits. Prominence of the hilar vasculature. Airspace opacity of the left upper left lower lobes. Increased interstitial  markings. No pleural effusion. No pneumothorax. The bowel gas pattern is normal. No radio-opaque calculi or other significant radiographic abnormality are seen. Vascular calcifications within the pelvis. No acute osseous abnormality. IMPRESSION: 1. Pulmonary edema with likely persistent left lung infection/inflammation. 2. Nonobstructive bowel gas pattern. Electronically Signed   By: Iven Finn M.D.   On: 08/31/2021 15:32   VAS [place] LOWER EXTREMITY VENOUS (DVT)  Result Date: 09/01/2021  Lower Venous DVT Study Patient Name:  Jose [name]  Date of Exam:   08/31/2021 Medical Rec #: [id number]        Accession #:    [id number] Date of Birth: 09/17/1976        Patient Gender: M Patient Age:   45 years Exam Location:  [facility] Procedure:      VAS [place] LOWER EXTREMITY VENOUS (DVT) Referring Phys: PAMELA LOVE --------------------------------------------------------------------------------  Indications: Edema.  Comparison Study: 08/21/2021- Negative lower extremity venous duplex Performing Technologist: Maudry Mayhew MHA, RDMS, RVT, RDCS  Examination Guidelines: A complete evaluation includes B-mode imaging, spectral Doppler, color Doppler, and power Doppler as needed of all accessible portions of each vessel. Bilateral testing is considered an integral part of a complete examination. Limited examinations for reoccurring indications may be performed as noted. The reflux portion of the exam is performed with the patient in reverse Trendelenburg.  +---------+---------------+---------+-----------+----------+--------------+ RIGHT    CompressibilityPhasicitySpontaneityPropertiesThrombus Aging +---------+---------------+---------+-----------+----------+--------------+ CFV  Full           Yes      Yes                                 +---------+---------------+---------+-----------+----------+--------------+ SFJ      Full                                                         +---------+---------------+---------+-----------+----------+--------------+ FV Prox  Full                                                        +---------+---------------+---------+-----------+----------+--------------+ FV Mid   Full                                                        +---------+---------------+---------+-----------+----------+--------------+ FV DistalFull                                                        +---------+---------------+---------+-----------+----------+--------------+ PFV      Full                                                        +---------+---------------+---------+-----------+----------+--------------+ POP      Full           Yes      Yes                                 +---------+---------------+---------+-----------+----------+--------------+ PTV      Full                                                        +---------+---------------+---------+-----------+----------+--------------+ PERO     Full                                                        +---------+---------------+---------+-----------+----------+--------------+   +---------+---------------+---------+-----------+----------+--------------+ LEFT     CompressibilityPhasicitySpontaneityPropertiesThrombus Aging +---------+---------------+---------+-----------+----------+--------------+ CFV      Full           Yes      Yes                                 +---------+---------------+---------+-----------+----------+--------------+  SFJ      Full                                                        +---------+---------------+---------+-----------+----------+--------------+ FV Prox  Full                                                        +---------+---------------+---------+-----------+----------+--------------+ FV Mid   Full                                                         +---------+---------------+---------+-----------+----------+--------------+ FV DistalFull                                                        +---------+---------------+---------+-----------+----------+--------------+ PFV      Full                                                        +---------+---------------+---------+-----------+----------+--------------+ POP      Full           Yes      Yes                                 +---------+---------------+---------+-----------+----------+--------------+ PTV      Full                                                        +---------+---------------+---------+-----------+----------+--------------+ PERO     Full                                                        +---------+---------------+---------+-----------+----------+--------------+     Summary: BILATERAL: - No evidence of deep vein thrombosis seen in the lower extremities, bilaterally. -No evidence of popliteal cyst, bilaterally.   *See table(s) above for measurements and observations. Electronically signed by Deitra Mayo MD on 09/01/2021 at 7:35:46 AM.    Final    Recent Labs    09/01/21 0412 09/02/21 0359  WBC 7.6 6.3  HGB 8.5* 7.6*  HCT 26.9* 24.2*  PLT 322 279   Recent Labs    08/31/21 0359 08/31/21 1128  NA 136 136  K 3.5 3.6  CL 108 108  CO2 20* 20*  GLUCOSE 108* 214*  BUN 37* 35*  CREATININE 1.34* 1.35*  CALCIUM 8.4* 8.2*    Intake/Output Summary (Last 24 hours) at 09/02/2021 1102 Last data filed at 09/02/2021 0700 Gross per 24 hour  Intake 600 ml  Output 1250 ml  Net -650 ml     Pressure Injury 08/10/21 Coccyx Medial Stage 2 -  Partial thickness loss of dermis presenting as a shallow open injury with a red, pink wound bed without slough. (Active)  08/10/21 1700  Location: Coccyx (gluteal crease)  Location Orientation: Medial  Staging: Stage 2 -  Partial thickness loss of dermis presenting as a shallow open injury with a red,  pink wound bed without slough.  Wound Description (Comments):   Present on Admission: Yes     Pressure Injury 08/14/21 Buttocks Bilateral Deep Tissue Pressure Injury - Purple or maroon localized area of discolored intact skin or blood-filled blister due to damage of underlying soft tissue from pressure and/or shear. DTI (Active)  08/14/21 0800  Location: Buttocks  Location Orientation: Bilateral  Staging: Deep Tissue Pressure Injury - Purple or maroon localized area of discolored intact skin or blood-filled blister due to damage of underlying soft tissue from pressure and/or shear.  Wound Description (Comments): DTI  Present on Admission:     Physical Exam: Vital Signs Blood pressure 123/71, pulse 69, temperature 98.1 F (36.7 C), resp. rate 19, height 5\' 11"  (1.803 m), weight 92.5 kg, SpO2 97 %.      General: awake, alert, appropriate,  flat affect; NAD HENT: conjugate gaze; oropharynx moist CV: regular rate; no JVD Pulmonary: CTA B/L; except decreased at L base esp. Wearing 2L O2 by [place] [facility]: soft, NT, ND, (+)BS- less distended Psychiatric: appropriate; flat affect, but appropriate Neurological: alert   motor strength is 5/5 in bilateral deltoid, bicep, tricep, grip, 4-/5 hip flexor, knee extensors, 3/5 ankle dorsiflexor and plantar flexor Sensory exam reduced LT and Proprioception bilateral feet  Assessment/Plan: 1. Functional deficits which require 3+ hours per day of interdisciplinary therapy in a comprehensive inpatient rehab setting. Physiatrist is providing close team supervision and 24 hour management of active medical problems listed below. Physiatrist and rehab team continue to assess barriers to discharge/monitor patient progress toward functional and medical goals  Care Tool:  Bathing    Body parts bathed by patient: Right arm, Left arm, Chest, Abdomen, Front perineal area, Buttocks, Face   Body parts bathed by helper: Right upper leg, Left upper leg, Right lower  leg, Left lower leg     Bathing assist Assist Level: Maximal Assistance - Patient 24 - 49%     Upper Body Dressing/Undressing Upper body dressing   What is the patient wearing?: Pull over shirt    Upper body assist Assist Level: Maximal Assistance - Patient 25 - 49%    Lower Body Dressing/Undressing Lower body dressing      What is the patient wearing?: Pants, Underwear/pull up     Lower body assist Assist for lower body dressing: Maximal Assistance - Patient 25 - 49%     Toileting Toileting Toileting Activity did not occur (Clothing management and hygiene only): N/A (no void or bm) (pt had to be cathed)  Toileting assist Assist for toileting: 2 Helpers     Transfers Chair/bed transfer  Transfers assist  Chair/bed transfer activity did not occur: Safety/medical concerns  Chair/bed transfer assist level: 2 Helpers     Locomotion Ambulation   Ambulation assist   Ambulation activity did not occur: Safety/medical concerns  Walk 10 feet activity   Assist  Walk 10 feet activity did not occur: Safety/medical concerns        Walk 50 feet activity   Assist Walk 50 feet with 2 turns activity did not occur: Safety/medical concerns         Walk 150 feet activity   Assist Walk 150 feet activity did not occur: Safety/medical concerns         Walk 10 feet on uneven surface  activity   Assist Walk 10 feet on uneven surfaces activity did not occur: Safety/medical concerns         Wheelchair     Assist Is the patient using a wheelchair?: Yes Type of Wheelchair: Manual Wheelchair activity did not occur: Safety/medical concerns         Wheelchair 50 feet with 2 turns activity    Assist    Wheelchair 50 feet with 2 turns activity did not occur: Safety/medical concerns       Wheelchair 150 feet activity     Assist  Wheelchair 150 feet activity did not occur: Safety/medical concerns       Blood pressure 123/71,  pulse 69, temperature 98.1 F (36.7 C), resp. rate 19, height 5\' 11"  (1.803 m), weight 92.5 kg, SpO2 97 %.    Medical Problem List and Plan: 1.  Decline in self care and mobility  secondary to thoracic myelopathy/thoracic osteomyelitis.              -patient may  shower             -ELOS/Goals: 10-12d  Con't PT and OT_ dteam conference today- d/c 3 weeks or so since pain so limiting.   11/9- con't PT and OT- working on pain 2.  Antithrombotics: -DVT/anticoagulation:  N/A             -antiplatelet therapy: none 3. Pain Management: Oxycodone prn. 11/8- will increase oxy to q4 hours prn- might need long acting?   11/9- will add MS Contin 15 mg BID for pain- and monitor 4. Mood: LCSW to follow for evaluation and support.              -antipsychotic agents: N/A 5. Neuropsych: This patient is intermittently capable of making decisions on his own behalf. 6. Skin/Wound Care: Air mattress for pressure relief measures.  7. Fluids/Electrolytes/Nutrition: Monitor I/O. Check lytes in am.  8. MSSA bacteremia: On ancef with end date 12/01 9. UGIB/melena: Has received 11 units PRBC during admission --Continue protonix BID w/ carafate ac/hs.  --Monitor for signs of bleeding.  11/9- will recheck labs today at 3pm- to make sure not dropping more since is s/p so many units of pRBCs.  10. T2DM: Po intake poor --continue TF at nights. Continue insulin gargline 12 units at nights --monitor BS every 4 hours with SSI 11/7- BG's look great- still poor appetite, but I think thrush is making it worse- things "taste weird". Con't to monitor and regimen  11/8- change BG's to qacHS 11. HTN: Monitor BP TID--continue Norvasc and metoprolol for control.   11/7- BP somewhat elevated in 638L- systolic- will monitor for trend.  12. ABLA: On aranesp weekly for supplement.  11/7- Hb stable at 8.4- con't to monitor  11/9- Hb today is 7.6- down from 8.4- will recheck in AM to verify- hemodilution?  13. Acute renal  insufficiency- 11/7- improving- Cr down to 1.37 and BUN down to 37-   11/9- will recheck in AM- is stable last time  14. O2 need/acute resp failure  11/7- will wean O2 as able 15. Persistent pulmonary edema  11/9- will give Lasix 40 mg daily x 3 days and KCL 40 mEq x3 days as well since K+ is borderline low.  8. Dispo  11/7- wife left note asking for "CNS w/u and endo work up as well as some more esoteric dx's"- will need to discuss with her before order any work up.    16. Urinary retention- 11/8- will start Flomax- foley out- in/out caths- will do allergy test to see if use lidocaine jelly- if so, order.    I spent a total of 43 minutes on total care today- >50% coordination of care- due to Anemia, as well as speaking with PA and nursing about care   LOS: 3 days A FACE TO FACE EVALUATION WAS PERFORMED  Jose [name] 09/02/2021, 11:02 AM

## 2021-09-02 NOTE — Progress Notes (Signed)
Physical Therapy Session Note  Patient Details  Name: Jose [name] MRN: [id number] Date of Birth: Mar 04, 1976  Today's Date: 09/02/2021 PT Individual Time: 0263-7858, 8502-7741 PT Individual Time Calculation (min): 58 min, 54 min   Short Term Goals: Week 1:  PT Short Term Goal 1 (Week 1): Pt will complete bed mobility with minA and LRAD PT Short Term Goal 2 (Week 1): Pt will complete bed<>chair transfers with modA and LRAD PT Short Term Goal 3 (Week 1): Pt will tolerate sitting in recliner or TIS w/c for > 2 hours outside of therapies PT Short Term Goal 4 (Week 1): Pt will ambulate 17ft with modA and LRAD  Skilled Therapeutic Interventions/Progress Updates:   Session 1: pt received in bed and agreeable to therapy. Pt reports 7/10 pain at rest, premedicated right before session. Pt agreeable to bed level exercise as no + 2 is available at this time.  Pt performed the following exercises to promote LE strength and endurance:  -heel slides 4 x 10 for ROM and hamstring strength -supine abduction 4 x 10, discussed pt practicing exercises outside of therapy, being sure to get adequate rest -Attempted supine rows, but pt experienced increased pain, so discontinued exercise -Biceps curl with blue exercise tubing, 2 x 10  -supine leg extension into blue band 3 x 8 for quad strength and mobility  -banded hooklying ER 4x10 in quick succession -Pt guided in self calf stretch with gait belt BIL Pt performed nerve glides with assist to elevate BLE and reported improvements in pain and LE discomfort.  Pt demoes poor DF ROM with decr mobility at the talus during assisted DF stretch, would benefit from incr mobility prior to standing attempt. Pt was left with all needs in reach and alarm active.   Session 2: pt received in bed and agreeable to therapy. Pt reports 4/10 pain at rest, as high as 7/10 with activity, premedicated. Pt directed in nerve glides for BLE to address neural tension and improve  pain. Upon attempting UE median nerve glides, therapist noted possible cogwheel rigidity in the biceps. Supine>sit with in A x 2 for LE and trunk management. Pt was able to sit for ~30-45 sec. Noted some dizziness that did not resolve with UE movement and deep breathing to increase venous return so returned to bed in the same manner. Pt required several minutes to allow pain levels to return to baseline. Pt politely declined second attempt d/t pain. Discussed various transfer methods and importance of sitting upright to improve dizziness. Pt remained in bed at end of session and was left with all needs in reach and alarm active.   Therapy Documentation Precautions:  Precautions Precautions: Fall, Back Precaution Booklet Issued:  (verbally reviewed post-up back precautions) Precaution Comments: 2L oxygen, high pain levels, PICC line Restrictions Weight Bearing Restrictions: No General:   Vital Signs:   Pain:   Mobility:   Locomotion :    Trunk/Postural Assessment :    Balance:   Exercises:   Other Treatments:      Therapy/Group: Individual Therapy  [name] 09/02/2021, 9:08 AM

## 2021-09-02 NOTE — Progress Notes (Addendum)
Consult for PICC flush/cap: Pt due another dose of antibiotic at midnight. Recommend continuing KVO fluids to minimize connections into line as long as pt is not fluid restricted. Will disconnect in AM for therapies. Discussed with Romeo Apple, RN.

## 2021-09-02 NOTE — Plan of Care (Signed)
  Problem: Consults Goal: RH SPINAL CORD INJURY PATIENT EDUCATION Description:  See Patient Education module for education specifics.  Outcome: Progressing Goal: Skin Care Protocol Initiated - if Braden Score 18 or less Description: If consults are not indicated, leave blank or document N/A Outcome: Progressing Goal: Diabetes Guidelines if Diabetic/Glucose > 140 Description: If diabetic or lab glucose is > 140 mg/dl - Initiate Diabetes/Hyperglycemia Guidelines & Document Interventions  Outcome: Progressing   Problem: SCI BOWEL ELIMINATION Goal: RH STG MANAGE BOWEL WITH ASSISTANCE Description: STG Manage Bowel with min Assistance. Outcome: Progressing Goal: RH STG SCI MANAGE BOWEL WITH MEDICATION WITH ASSISTANCE Description: STG SCI Manage bowel with medication with min assistance. Outcome: Progressing Goal: RH STG SCI MANAGE BOWEL PROGRAM W/ASSIST OR AS APPROPRIATE Description: STG SCI Manage bowel program with min assist or as appropriate. Outcome: Progressing   Problem: SCI BLADDER ELIMINATION Goal: RH STG MANAGE BLADDER WITH ASSISTANCE Description: STG Manage Bladder With min Assistance Outcome: Progressing Goal: RH STG MANAGE BLADDER WITH MEDICATION WITH ASSISTANCE Description: STG Manage Bladder With Medication With min Assistance. Outcome: Progressing Goal: RH STG MANAGE BLADDER WITH EQUIPMENT WITH ASSISTANCE Description: STG Manage Bladder With Equipment With min Assistance Outcome: Progressing   Problem: RH SKIN INTEGRITY Goal: RH STG SKIN FREE OF INFECTION/BREAKDOWN Description: Skin to remain free from additional breakdown with min assist. Outcome: Progressing Goal: RH STG MAINTAIN SKIN INTEGRITY WITH ASSISTANCE Description: STG Maintain Skin Integrity With min Assistance. Outcome: Progressing   Problem: RH SAFETY Goal: RH STG ADHERE TO SAFETY PRECAUTIONS W/ASSISTANCE/DEVICE Description: STG Adhere to Safety Precautions With cues and reminders. Outcome:  Progressing   Problem: RH PAIN MANAGEMENT Goal: RH STG PAIN MANAGED AT OR BELOW PT'S PAIN GOAL Description: < 2 on a 0-10 pain scale. Outcome: Progressing   Problem: RH KNOWLEDGE DEFICIT SCI Goal: RH STG INCREASE KNOWLEDGE OF SELF CARE AFTER SCI Description: Patient will demonstrate knowledge of medication/pain management, bowel/bladder program, and weight  bearing precautions with educational materials and handouts provided by staff independently at discharge. Outcome: Progressing   Problem: RH BOWEL ELIMINATION Goal: RH STG MANAGE BOWEL W/MEDICATION W/ASSISTANCE Description: STG Manage Bowel with Medication with Assistance. Outcome: Progressing   Problem: RH BLADDER ELIMINATION Goal: RH STG MANAGE BLADDER WITH MEDICATION WITH ASSISTANCE Description: STG Manage Bladder With Medication With Assistance. Outcome: Progressing   Problem: RH SKIN INTEGRITY Goal: RH STG SKIN FREE OF INFECTION/BREAKDOWN Outcome: Progressing Goal: RH STG MAINTAIN SKIN INTEGRITY WITH ASSISTANCE Description: STG Maintain Skin Integrity With Assistance. Outcome: Progressing Goal: RH STG ABLE TO PERFORM INCISION/WOUND CARE W/ASSISTANCE Description: STG Able To Perform Incision/Wound Care With Assistance. Outcome: Progressing   Problem: RH SAFETY Goal: RH STG DECREASED RISK OF FALL WITH ASSISTANCE Description: STG Decreased Risk of Fall With Assistance. Outcome: Progressing   Problem: RH PAIN MANAGEMENT Goal: RH STG PAIN MANAGED AT OR BELOW PT'S PAIN GOAL Outcome: Progressing   Problem: RH KNOWLEDGE DEFICIT GENERAL Goal: RH STG INCREASE KNOWLEDGE OF SELF CARE AFTER HOSPITALIZATION Outcome: Progressing

## 2021-09-02 NOTE — Progress Notes (Signed)
Occupational Therapy Session Note  Patient Details  Name: Jose [name] MRN: [id number] Date of Birth: 07/27/1976  Today's Date: 09/02/2021 OT Individual Time: 3546-5681 OT Individual Time Calculation (min): 40 min    Short Term Goals: Week 1:  OT Short Term Goal 1 (Week 1): Pt will tolerate sitting EOB ~10 minutes during an ADL session to demonstrate improvement in pain mgt OT Short Term Goal 2 (Week 1): Pt will complete 1/3 components of donning pants with AE as needed OT Short Term Goal 3 (Week 1): Pt will complete LB dressing at sit<stand level using LRAD  Skilled Therapeutic Interventions/Progress Updates:    Pt resting in bed upon arrival. Pt states he had not received any pain meds yet this morning. Pt also communicated that currently his anxiety was 5/10. OT intervention with focus on emotional support, role of rehab/OT, discharge planning, and education. Pt communicated that he was surprised that he would be in hospital another 3 weeks and stated he was hoping he would be able to leave in another week. Reeducated pt on purpose of rehab and reminded pt that he has been unable to get OOB 2/2 pain/anxiety since admission in rehab. Pt communicated that he does not want to be a burden on his wife. OTA again reminded pt that his time in rehab will focus on increased mobility and independence with BADLs to reduce burden on wife and family. Pt appreciative of education and support provided. Pt remained in bed with all needs within reach.   Therapy Documentation Precautions:  Precautions Precautions: Fall, Back Precaution Booklet Issued:  (verbally reviewed post-up back precautions) Precaution Comments: 2L oxygen, high pain levels, PICC line Restrictions Weight Bearing Restrictions: No Pain:  Pt c/o 6/10 pain and 5/10 anxiety this morning; pt states he cannot have any pain meds for another 30 mins; emotional support   Therapy/Group: Individual Therapy  Leroy Libman 09/02/2021, 8:14 AM

## 2021-09-02 NOTE — IPOC Note (Signed)
Overall Plan of Care [facility]) Patient Details Name: Jose [name] MRN: [id number] DOB: 01-07-1976  Admitting Diagnosis: <principal problem not specified>  Hospital Problems: Active Problems:   Debility     Functional Problem List: Nursing Behavior, Bladder, Medication Management, Safety, Pain, Endurance, Edema, Skin Integrity  PT Balance, Edema, Behavior, Endurance, Motor, Pain, Sensory, Safety, Skin Integrity  OT Balance, Skin Integrity, Edema, Endurance, Motor, Pain, Safety, Sensory  SLP    TR         Basic ADL's: OT Grooming, Bathing, Dressing, Toileting, Eating     Advanced  ADL's: OT Simple Meal Preparation     Transfers: PT Bed Mobility, Bed to Chair, Car  OT Toilet, Tub/Shower     Locomotion: PT Ambulation, Stairs     Additional Impairments: OT Other (comment) (not thoroughly assessed)  SLP        TR      Anticipated Outcomes Item Anticipated Outcome  Self Feeding Setup  Swallowing      Basic self-care  Setup-supervision  Toileting  Supervision   Bathroom Transfers Supervision  Bowel/Bladder  mod I  Transfers  supervision with LRAD  Locomotion  supervision with LRAD  Communication     Cognition     Pain  pain less than2  Safety/Judgment  mod I   Therapy Plan: PT Intensity: Minimum of 1-2 x/day ,45 to 90 minutes PT Frequency: 5 out of 7 days PT Duration Estimated Length of Stay: 3 weeks OT Intensity: Minimum of 1-2 x/day, 45 to 90 minutes OT Frequency: 5 out of 7 days OT Duration/Estimated Length of Stay: 21-23 days     Due to the current state of emergency, patients may not be receiving their 3-hours of Medicare-mandated therapy.   Team Interventions: Nursing Interventions Bladder Management, Pain Management, Disease Management/Prevention, Medication Management, Skin Care/Wound Management, Bowel Management, Patient/Family Education, Discharge Planning  PT interventions Ambulation/gait training, Training and development officer,  Cognitive remediation/compensation, Community reintegration, Discharge planning, Disease management/prevention, DME/adaptive equipment instruction, Functional electrical stimulation, Functional mobility training, Therapeutic Exercise, Psychosocial support, Patient/family education, Pain management, Neuromuscular re-education, Skin care/wound management, Splinting/orthotics, Stair training, Therapeutic Activities, UE/LE Strength taining/ROM, UE/LE Coordination activities, Visual/perceptual remediation/compensation, Wheelchair propulsion/positioning  OT Interventions Balance/vestibular training, Disease mangement/prevention, Self Care/advanced ADL retraining, Therapeutic Exercise, Wheelchair propulsion/positioning, UE/LE Strength taining/ROM, Skin care/wound managment, Pain management, DME/adaptive equipment instruction, Community reintegration, Barrister's clerk education, UE/LE Coordination activities, Therapeutic Activities, Psychosocial support, Functional mobility training, Discharge planning  SLP Interventions    TR Interventions    SW/CM Interventions Discharge Planning, Psychosocial Support, Patient/Family Education   Barriers to Discharge MD  Medical stability, Home enviroment access/loayout, Incontinence, Neurogenic bowel and bladder, Weight, Nutritional means, and New oxygen  Nursing Decreased caregiver support, Incontinence, Wound Care, Lack of/limited family support, Weight, Weight bearing restrictions, Medication compliance Lives in 1 level home with 2 steps to enter and no rails. Lives with spouse who can provide 24/7 care at discharge.  PT Home environment access/layout, Wound Care, Insurance for SNF coverage, Other (comments) Pain  OT Weight, New oxygen pain  SLP      SW Insurance for SNF coverage, IV antibiotics     Team Discharge Planning: Destination: PT-Home ,OT- Home , SLP-  Projected Follow-up: PT-24 hour supervision/assistance, Home health PT (Pending progress - HHPT vs OPPT),  OT-  24 hour supervision/assistance, Home health OT, SLP-  Projected Equipment Needs: PT-To be determined, OT- To be determined, SLP-  Equipment Details: PT-Pt owns a 3-1 commode, OT-  Patient/family involved in discharge planning: PT- Patient,  OT-Patient,  SLP-   MD ELOS: 3 weeks Medical Rehab Prognosis:  Good Assessment: Pt is a 45 yr old male with osteomyelitis Of thoracic spine/myelopathy- with severe pain/uncontrolled- MSSA baceteremia; UGIB- s/p 11 units pRBCs, drop in Hb; and persistent pulmonary edema-  Goals-hopefully S/U/Supervision due to age   See Team Conference Notes for weekly updates to the plan of care

## 2021-09-03 DIAGNOSIS — M4624 Osteomyelitis of vertebra, thoracic region: Secondary | ICD-10-CM | POA: Diagnosis not present

## 2021-09-03 DIAGNOSIS — R5381 Other malaise: Secondary | ICD-10-CM | POA: Diagnosis not present

## 2021-09-03 LAB — BASIC METABOLIC PANEL
Anion gap: 6 (ref 5–15)
BUN: 24 mg/dL — ABNORMAL HIGH (ref 6–20)
CO2: 23 mmol/L (ref 22–32)
Calcium: 8.1 mg/dL — ABNORMAL LOW (ref 8.9–10.3)
Chloride: 105 mmol/L (ref 98–111)
Creatinine, Ser: 1.33 mg/dL — ABNORMAL HIGH (ref 0.61–1.24)
GFR, Estimated: >60 mL/min (ref >60)
Glucose, Bld: 103 mg/dL — ABNORMAL HIGH (ref 70–99)
Potassium: 3.6 mmol/L (ref 3.5–5.1)
Sodium: 134 mmol/L — ABNORMAL LOW (ref 135–145)

## 2021-09-03 LAB — GLUCOSE, CAPILLARY
Glucose-Capillary: 164 mg/dL — ABNORMAL HIGH (ref 70–99)
Glucose-Capillary: 88 mg/dL (ref 70–99)
Glucose-Capillary: 123 mg/dL — ABNORMAL HIGH (ref 70–99)

## 2021-09-03 LAB — CBC WITH DIFFERENTIAL/PLATELET
Abs Immature Granulocytes: 0.03 10*3/uL (ref 0.00–0.07)
Basophils Absolute: 0 10*3/uL (ref 0.0–0.1)
Basophils Relative: 1 %
Eosinophils Absolute: 0.5 10*3/uL (ref 0.0–0.5)
Eosinophils Relative: 8 %
HCT: 25.8 % — ABNORMAL LOW (ref 39.0–52.0)
Hemoglobin: 8.1 g/dL — ABNORMAL LOW (ref 13.0–17.0)
Immature Granulocytes: 1 %
Lymphocytes Relative: 21 %
Lymphs Abs: 1.3 10*3/uL (ref 0.7–4.0)
MCH: 29 pg (ref 26.0–34.0)
MCHC: 31.4 g/dL (ref 30.0–36.0)
MCV: 92.5 fL (ref 80.0–100.0)
Monocytes Absolute: 1.1 10*3/uL — ABNORMAL HIGH (ref 0.1–1.0)
Monocytes Relative: 17 %
Neutro Abs: 3.4 10*3/uL (ref 1.7–7.7)
Neutrophils Relative %: 52 %
Platelets: 300 10*3/uL (ref 150–400)
RBC: 2.79 MIL/uL — ABNORMAL LOW (ref 4.22–5.81)
RDW: 18.1 % — ABNORMAL HIGH (ref 11.5–15.5)
WBC: 6.4 10*3/uL (ref 4.0–10.5)
nRBC: 0 % (ref 0.0–0.2)

## 2021-09-03 MED ORDER — JUVEN PO PACK
1.0000 | PACK | Freq: Two times a day (BID) | ORAL | Status: DC
  Administered 2021-09-03 – 2021-09-19 (×6): 1 via ORAL
  Filled 2021-09-03 (×13): qty 1

## 2021-09-03 NOTE — Progress Notes (Signed)
Occupational Therapy Session Note  Patient Details  Name: Jose [name] MRN: [id number] Date of Birth: 01-01-1976  Today's Date: 09/04/2021 OT Individual Time: 1779-3903 and 1505-1600 OT Individual Time Calculation (min): 45 min and 55 min  Short Term Goals: Week 1:  OT Short Term Goal 1 (Week 1): Pt will tolerate sitting EOB ~10 minutes during an ADL session to demonstrate improvement in pain mgt OT Short Term Goal 2 (Week 1): Pt will complete 1/3 components of donning pants with AE as needed OT Short Term Goal 3 (Week 1): Pt will complete LB dressing at sit<stand level using LRAD  Skilled Therapeutic Interventions/Progress Updates:    Pt greeted in bed, premedicated for back pain. In depth education provided regarding anxiety/pain mgt in order to maximize his participation in therapy/functional activity. We discussed evidence-base of mindfulness strategies such as pain awareness, present moment awareness, diaphragmatic breathing, progressive muscle relaxation, and body scanning. Guided him through gentle UB stretches with focus on incorporation of diaphragmatic breathing and present moment awareness. Provided him a handout to review with some writing/reflective exercises to complete independently. Pt very collaborative in regards to education and motivated to use stated strategies during his future therapies. He remained in bed at close of session, all needs within reach.   2nd Session 1:1 tx (55 min) Pt greeted in bed and premedicated for pain. Stated that he had recently had hydrotherapy and felt that his back was "wrecked." Declined to attempt EOB or OOB tx due to pain. Therefore tx was completed bedlevel. Worked extensively on his fine motor skills and hand coordination, providing pt with a notepad and built up writing implement. Pt participated in 3 structured [place] activities (1 involving writing positive affirmations for his wall to increase self efficacy when participating in therapy). He  required cues to slow pace and concentrate on task. He reports that he takes a lot of notes at work, often writes fast but will have to learn to write slow until his handwriting is legible again. OT wrote down a few fine motor home exercise programs in his notepad and also supplied him with 2 additional fine motor/coordination programs to complete independently in the room. We reviewed programs together and teach back utilized to assess his understanding. Pt remained in bed at close of session, all needs within reach and 4 bedrails up of air mattress.   Therapy Documentation Precautions:  Precautions Precautions: Fall, Back Precaution Booklet Issued:  (verbally reviewed post-up back precautions) Precaution Comments: 2L oxygen, high pain levels, PICC line Restrictions Weight Bearing Restrictions: No  Pain: Pain Assessment Pain Scale: 0-10 Pain Score: 0-No pain ADL: ADL Eating: Dependent (per most recent staff documentation) Grooming: Maximal assistance Where Assessed-Grooming: Edge of bed Upper Body Bathing: Supervision/safety Where Assessed-Upper Body Bathing: Edge of bed Lower Body Bathing: Maximal assistance Where Assessed-Lower Body Bathing: Edge of bed, Bed level Upper Body Dressing: Maximal assistance Where Assessed-Upper Body Dressing: Edge of bed Lower Body Dressing: Maximal assistance, Dependent Where Assessed-Lower Body Dressing: Bed level Toileting: Not assessed Toilet Transfer: Not assessed Tub/Shower Transfer: Not assessed ADL Comments: unable to assess transfer abilities due to heightened pain, very limited tolerance sitting EOB due to pain  Therapy/Group: Individual Therapy  Jose [name] A Jose [name] 09/04/2021, 12:21 PM

## 2021-09-03 NOTE — Progress Notes (Signed)
Physical Therapy Session Note  Patient Details  Name: Jose [name] MRN: [id number] Date of Birth: 09/14/1976  Today's Date: 09/03/2021 PT Individual Time: 1100-1200, 1400-1504 PT Individual Time Calculation (min): 60 min, 64 min  Short Term Goals: Week 1:  PT Short Term Goal 1 (Week 1): Pt will complete bed mobility with minA and LRAD PT Short Term Goal 2 (Week 1): Pt will complete bed<>chair transfers with modA and LRAD PT Short Term Goal 3 (Week 1): Pt will tolerate sitting in recliner or TIS w/c for > 2 hours outside of therapies PT Short Term Goal 4 (Week 1): Pt will ambulate 59ft with modA and LRAD  Skilled Therapeutic Interventions/Progress Updates:    Session 1: pt received in bed and agreeable to therapy. Pt premedicated, 7/10 pain throughout session. Began session with bed level exercises for "warm up:" assisted femoral nerve glides, heel slides, supine hip/knee extension with band. Supine<>sit with mod A for LE management. Pt stood with mod x 2 and performed Stand pivot transfer with RW to [place] chair. Pt sat up in chair for several minutes, but returned to bed d/t burning pain in shoulder and back. Pt required extended rest breaks throughout d/t pain and anxiety. Pt returned to bed in the same manner and was left with all needs in reach and alarm active.   Session 2:  pt received in bed and agreeable to therapy. Nursing in/out for meds pass, including pain medication. 8/10 before medication. Agreeable to sitting EOB. CGA to roll to L side and mod A to come to sitting for LE management and to maintain precautions. Pt reported burning pain from surgical site out to R shoulder. Returned to bed in same manner and took several minutes for pain and anxiety levels to come back down. Pt politely declined further sitting d/t pain.  Pt performed the following exercises to promote LE/UE strength and endurance: SLR 3 x 10 Bicep curls 3 x 10 Rows 3 x 10 Eccentric shoulder flexion (OH  tension) 4 x 8 Shoulder IR 3 x 10 Alternating punches with band anchored OH 4 x 10 Banded hooklying hip ER Supine SL and DL "squats" with band, 3 x 12  Therapist performed passive DF stretch with AP talocrural glide for improved ankle ROM. Pt remained in bed after session and was left with all needs in reach and alarm active.   Therapy Documentation Precautions:  Precautions Precautions: Fall, Back Precaution Booklet Issued:  (verbally reviewed post-up back precautions) Precaution Comments: 2L oxygen, high pain levels, PICC line Restrictions Weight Bearing Restrictions: No    Therapy/Group: Individual Therapy  [name] 09/03/2021, 1:25 PM

## 2021-09-03 NOTE — Consult Note (Signed)
[place] Nurse Consult Note: Patient receiving care in [phone] Last evaluated by the [place] team on 10/28 while in the ICU Reason for Consult: sacral wound Wound type: Unstageable wound in the intergluteal cleft that was evaluated on 08/21/21 and was found to have a black base, painful and had brown sanguinous drainage. Santyl was ordered at that time. Today the wound is 100% yellow not as painful.  Pressure Injury POA: No (originally evaluated in ICU on 60M) Measurement: 4.5 X 3 X 0.3 Wound bed: Yellow  Drainage (amount, consistency, odor) sanguinous on the dressing.  Periwound: Intact Dressing procedure/placement/frequency: Hydrotherapy M/W/F then Apply a nickel thick layer of Santyl over the sacral wound, cover with a moistened saline gauze and secure with foam dressing. DRESSINGS TO BE COMPLETED BY BEDSIDE NURSE ALL OTHER DAYS  Monitor the wound area(s) for worsening of condition such as: Signs/symptoms of infection, increase in size, development of or worsening of odor, development of pain, or increased pain at the affected locations.   Notify the medical team if any of these develop.  Thank you for the consult. [place] nurse will follow weekly  Please re-consult the [place] team if needed.  Cathlean Marseilles Tamala Julian, MSN, RN, [place], [place]le [place], [facility] Wound Treatment Associate Pager [phone]

## 2021-09-03 NOTE — Progress Notes (Signed)
Hypoglycemic Event  CBG: 63  Treatment: 8 oz juice   Symptoms: Asymptomatic  Follow-up CBG: Time:2255 CBG Result:77  Possible Reasons for Event: unknown  Comments/MD notified:Charge notified    Jose [name]

## 2021-09-03 NOTE — Progress Notes (Signed)
Occupational Therapy Session Note  Patient Details  Name: Jose [name] MRN: [id number] Date of Birth: 08/28/1976  Today's Date: 09/03/2021 OT Individual Time: 7741-2878 OT Individual Time Calculation (min): 54 min    Short Term Goals: Week 1:  OT Short Term Goal 1 (Week 1): Pt will tolerate sitting EOB ~10 minutes during an ADL session to demonstrate improvement in pain mgt OT Short Term Goal 2 (Week 1): Pt will complete 1/3 components of donning pants with AE as needed OT Short Term Goal 3 (Week 1): Pt will complete LB dressing at sit<stand level using LRAD   Skilled Therapeutic Interventions/Progress Updates:    Pt greeted at time of session semireclined in bed resting, agreeable to OT session. Adamantly declining all OOB and seated EOB activity, stating pain significant and just got pain meds from nursing. Pt did verbalize that did stand pivot with PT this am, but stating he cannot do any more seated or standing activity today. Bed level UB bathing (declined LB) with Min/Mod A as pt needed assist to roll for therapist to assist with washing back. Doffed shirt with technique to doff LUE first > head > RUE 2/2 pain and donned new shirt with hemitechnique, RUE first. Note pain significantly limiting and needed rest breaks throughout. Remainder of session focused on BUE ROM with lightweight walking stick from home for chest press, overhead raise, bicep curl, and external/internal rotation for 2x10-15 each and cues for form. Pt in bed resting alarm on call bell in reach.    Therapy Documentation Precautions:  Precautions Precautions: Fall, Back Precaution Booklet Issued:  (verbally reviewed post-up back precautions) Precaution Comments: 2L oxygen, high pain levels, PICC line Restrictions Weight Bearing Restrictions: No    Therapy/Group: Individual Therapy  Viona Gilmore 09/03/2021, 12:29 PM

## 2021-09-03 NOTE — Consult Note (Signed)
Consult Note  Jose [name] 30-Jan-1976  [id number].    Requesting MD: Courtney Heys, MD Chief Complaint/Reason for Consult: pilonidal cyst HPI:  Patient is a 45 year old male who is currently admitted to inpatient rehab s/p thoracic laminectomy and pediculotomy for discitis and osteomyelitis 08/07/21. Patient was admitted to inpatient rehab 08/30/21. Patient was originally hospitalized 9/28-10/10 with MSSA bacteremia and T4-T5 osteomyelitis and was discharged home on IV abx. He returned to ED 10/14 with worsened back pain and underwent surgery as listed above. Hospital course complicated by ABL anemia secondary to GI bleeding s/p EGD, acute hypoxic respiratory failure requiring intubation, esophagitis, urinary retention and AKI. PMH otherwise significant for T2DM and HTN. No current anticoagulation. Patient has reported increased pain over sacrum and trouble being able to sit upright secondary to this. He was noted to have a stage II pressure injury over sacrum on admission to rehab. Patient does report hx of pilonidal cyst that was drained in the past. General surgery asked to evaluate.   ROS: Review of Systems  Constitutional:  Negative for chills and fever.  Respiratory:  Negative for shortness of breath and wheezing.   Cardiovascular:  Negative for chest pain and palpitations.  Gastrointestinal:  Negative for abdominal pain, nausea and vomiting.  Musculoskeletal:  Positive for back pain.  All other systems reviewed and are negative.  Family History  Problem Relation Age of Onset   Diabetes Mother    Hyperlipidemia Mother    Stroke Mother    Diabetes Father    Hyperlipidemia Brother    Stroke Brother    ADD / ADHD Brother    ADD / ADHD Son     Past Medical History:  Diagnosis Date   GAD (generalized anxiety disorder)    Hyperlipidemia    Macular degeneration, bilateral    Retinopathy    Type II diabetes mellitus with complication, uncontrolled    retinopathy,  neuropathy, microalbuminuria    Past Surgical History:  Procedure Laterality Date   APPENDECTOMY     BIOPSY  08/10/2021   Procedure: BIOPSY;  Surgeon: Otis Brace, MD;  Location: [facility];  Service: Gastroenterology;;   BUBBLE STUDY  07/29/2021   Procedure: BUBBLE STUDY;  Surgeon: Jerline Pain, MD;  Location: [facility];  Service: Cardiovascular;;   ESOPHAGOGASTRODUODENOSCOPY (EGD) WITH PROPOFOL N/A 08/10/2021   Procedure: ESOPHAGOGASTRODUODENOSCOPY (EGD) WITH PROPOFOL;  Surgeon: Otis Brace, MD;  Location: [facility];  Service: Gastroenterology;  Laterality: N/A;   ESOPHAGOGASTRODUODENOSCOPY (EGD) WITH PROPOFOL N/A 08/24/2021   Procedure: ESOPHAGOGASTRODUODENOSCOPY (EGD) WITH PROPOFOL;  Surgeon: Otis Brace, MD;  Location: [facility];  Service: Gastroenterology;  Laterality: N/A;   HERNIA REPAIR     IR FLUORO GUIDED NEEDLE PLC ASPIRATION/INJECTION LOC  07/28/2021   LUMBAR LAMINECTOMY/DECOMPRESSION MICRODISCECTOMY N/A 08/07/2021   Procedure: THORACIC FOUR - THORACIC FIVE LAMINECTOMY/DECOMPRESSION OF SPINAL CORD, DEBRIDEMENT OF ABSCESS, MICRODISCECTOMY, INTRAOPERATIVE ULTRASOUND;  Surgeon: Consuella Lose, MD;  Location: [facility];  Service: Neurosurgery;  Laterality: N/A;   TEE WITHOUT CARDIOVERSION N/A 07/29/2021   Procedure: TRANSESOPHAGEAL ECHOCARDIOGRAM (TEE);  Surgeon: Jerline Pain, MD;  Location: [facility];  Service: Cardiovascular;  Laterality: N/A;   TRIGGER FINGER RELEASE Right 10/25/2019   Procedure: RIGHT INDEX FINGER RELEASE TRIGGER FINGER/A-1 PULLEY;  Surgeon: Daryll Brod, MD;  Location: [facility];  Service: Orthopedics;  Laterality: Right;  IV REGIONAL FOREARM BLOCK    Social History:  reports that he quit smoking about 13 months ago. His smoking use included cigarettes.  He has a 20.00 pack-year smoking history. He has never used smokeless tobacco. He reports that he does not currently use alcohol. He reports that he does not use  drugs.  Allergies:  Allergies  Allergen Reactions   Cranberry Itching   Hm Lidocaine Patch [Lidocaine] Dermatitis    Blisters skin    Melatonin Other (See Comments)    nightmares    Medications Prior to Admission  Medication Sig Dispense Refill   acetaminophen (TYLENOL) 500 MG tablet Take 500 mg by mouth every 6 (six) hours as needed for mild pain, fever or headache.     albuterol (VENTOLIN HFA) 108 (90 Base) MCG/ACT inhaler Inhale 1 puff into the lungs every 4 (four) hours as needed for wheezing or shortness of breath.     ALPRAZolam (XANAX) 0.25 MG tablet Take 0.25 mg by mouth 2 (two) times daily as needed for anxiety.     amLODipine (NORVASC) 10 MG tablet Take 1 tablet (10 mg total) by mouth daily. 30 tablet 0   atorvastatin (LIPITOR) 40 MG tablet Take 1 tablet (40 mg total) by mouth daily. 90 tablet 3   bisacodyl (DULCOLAX) 10 MG suppository Place 1 suppository (10 mg total) rectally daily as needed for moderate constipation. 12 suppository 0   ceFAZolin (ANCEF) IVPB Inject 2 g into the vein every 8 (eight) hours. Indication:  Osteomyelitis  First Dose: No Last Day of Therapy:  09/04/2022  Labs - Once weekly:  CBC/D and BMP, Labs - Every other week:  ESR and CRP Method of administration: IV Push Method of administration may be changed at the discretion of home infusion pharmacist based upon assessment of the patient and/or caregiver's ability to self-administer the medication ordered. 105 Units 0   ceFAZolin (ANCEF) IVPB Inject 2 g into the vein every 8 (eight) hours. Indication:  vertebral osteomyelitis First Dose: No Last Day of Therapy:  09/24/21 Labs - Once weekly:  CBC/D and BMP, Labs - Every other week:  ESR and CRP Method of administration: IV Push Method of administration may be changed at the discretion of home infusion pharmacist based upon assessment of the patient and/or caregiver's ability to self-administer the medication ordered. 100 Units 0   collagenase (SANTYL)  ointment Apply topically daily. 15 g 0   cyclobenzaprine (FLEXERIL) 10 MG tablet Take 1 tablet (10 mg total) by mouth 3 (three) times daily as needed for muscle spasms. 30 tablet 0   Darbepoetin Alfa (ARANESP) 150 MCG/0.3ML SOSY injection Inject 0.3 mLs (150 mcg total) into the skin every Monday at 6 PM. 1.68 mL    gabapentin (NEURONTIN) 100 MG capsule Take 2 capsules (200 mg total) by mouth 2 (two) times daily.     insulin aspart (NOVOLOG) 100 UNIT/ML injection Inject 0-20 Units into the skin every 4 (four) hours. 10 mL 11   insulin glargine-yfgn (SEMGLEE) 100 UNIT/ML injection Inject 0.12 mLs (12 Units total) into the skin at bedtime. 10 mL 11   metoprolol tartrate (LOPRESSOR) 50 MG tablet Take 1 tablet (50 mg total) by mouth 2 (two) times daily.     Multiple Vitamin (MULTIVITAMIN ADULT PO) Take 1 tablet by mouth daily.     mupirocin ointment (BACTROBAN) 2 % Apply 1 application topically 2 (two) times daily. 22 g 0   Nutritional Supplements (FEEDING SUPPLEMENT, PROSOURCE TF,) liquid Place 45 mLs into feeding tube 3 (three) times daily.     Nutritional Supplements (FEEDING SUPPLEMENT, VITAL 1.5 CAL,) LIQD Place 1,000 mLs into feeding tube daily.  oxyCODONE-acetaminophen (PERCOCET/ROXICET) 5-325 MG tablet Take 1 tablet by mouth every 6 (six) hours as needed for moderate pain. 30 tablet 0   pantoprazole (PROTONIX) 40 MG tablet Take 1 tablet (40 mg total) by mouth 2 (two) times daily.     polyethylene glycol (MIRALAX / GLYCOLAX) 17 g packet Take 17 g by mouth daily as needed for moderate constipation, mild constipation or severe constipation. 14 each 0   Ranibizumab (LUCENTIS) 0.3 MG/0.05ML SOLN 1 Dose by Intravitreal route every 3 (three) months.     senna-docusate (SENOKOT-S) 8.6-50 MG tablet Take 1 tablet by mouth 2 (two) times daily. 30 tablet 0   sucralfate (CARAFATE) 1 GM/10ML suspension Take 10 mLs (1 g total) by mouth 4 (four) times daily -  with meals and at bedtime. 420 mL 0   tamsulosin  (FLOMAX) 0.4 MG CAPS capsule Take 1 capsule (0.4 mg total) by mouth daily after breakfast. 30 capsule    tobramycin (TOBREX) 0.3 % ophthalmic solution Place 1 drop into both eyes See admin instructions. Begin 1 day prior to treatment and continue the day of treatment and for one full day after treatment.  5   traZODone (DESYREL) 100 MG tablet TAKE 1 TABLET(100 MG) BY MOUTH AT BEDTIME AS NEEDED FOR SLEEP (Patient taking differently: Take 100 mg by mouth at bedtime.) 90 tablet 0   VITAMIN D PO Take 1 tablet by mouth daily.      Blood pressure 140/74, pulse 73, temperature 98.6 F (37 C), temperature source Oral, resp. rate 18, height 5' 11"  (1.803 m), weight 92.5 kg, SpO2 96 %. Physical Exam:  General: pleasant, WD, WN male who is laying in bed and appears uncomfortable HEENT: head is normocephalic, atraumatic.  Sclera are noninjected. Ears and nose without any masses or lesions.  Mouth is pink and moist Heart: regular, rate, and rhythm.   Lungs: no audible wheezing. Respiratory effort nonlabored Abd: soft, NT, ND GU: sacral wound as pictured below with fibrinous eschar overlying, surrounding erythema appears to be from pressure rather than infection, some maceration of surrounding tissue as well  MS: all 4 extremities are symmetrical with no cyanosis, clubbing, or edema. Skin: warm and dry with no masses, lesions, or rashes Neuro: Cranial nerves 2-12 grossly intact, speech clear  Psych: A&Ox3 with an appropriate affect.   Results for orders placed or performed during the hospital encounter of 08/30/21 (from the past 48 hour(s))  Glucose, capillary     Status: Abnormal   Collection Time: 09/01/21 11:44 AM  Result Value Ref Range   Glucose-Capillary 164 (H) 70 - 99 mg/dL    Comment: Glucose reference range applies only to samples taken after fasting for at least 8 hours.  Glucose, capillary     Status: Abnormal   Collection Time: 09/01/21  4:24 PM  Result Value Ref Range    Glucose-Capillary 205 (H) 70 - 99 mg/dL    Comment: Glucose reference range applies only to samples taken after fasting for at least 8 hours.  Glucose, capillary     Status: Abnormal   Collection Time: 09/01/21 10:13 PM  Result Value Ref Range   Glucose-Capillary 107 (H) 70 - 99 mg/dL    Comment: Glucose reference range applies only to samples taken after fasting for at least 8 hours.  CBC with Differential/Platelet     Status: Abnormal   Collection Time: 09/02/21  3:59 AM  Result Value Ref Range   WBC 6.3 4.0 - 10.5 K/uL   RBC 2.63 (L)  4.22 - 5.81 MIL/uL   Hemoglobin 7.6 (L) 13.0 - 17.0 g/dL   HCT 24.2 (L) 39.0 - 52.0 %   MCV 92.0 80.0 - 100.0 fL   MCH 28.9 26.0 - 34.0 pg   MCHC 31.4 30.0 - 36.0 g/dL   RDW 18.1 (H) 11.5 - 15.5 %   Platelets 279 150 - 400 K/uL   nRBC 0.0 0.0 - 0.2 %   Neutrophils Relative % 55 %   Neutro Abs 3.6 1.7 - 7.7 K/uL   Lymphocytes Relative 21 %   Lymphs Abs 1.3 0.7 - 4.0 K/uL   Monocytes Relative 15 %   Monocytes Absolute 0.9 0.1 - 1.0 K/uL   Eosinophils Relative 7 %   Eosinophils Absolute 0.5 0.0 - 0.5 K/uL   Basophils Relative 1 %   Basophils Absolute 0.0 0.0 - 0.1 K/uL   Immature Granulocytes 1 %   Abs Immature Granulocytes 0.03 0.00 - 0.07 K/uL    Comment: Performed at [facility] [address], [place], [place] [address]  Glucose, capillary     Status: None   Collection Time: 09/02/21  6:29 AM  Result Value Ref Range   Glucose-Capillary 80 70 - 99 mg/dL    Comment: Glucose reference range applies only to samples taken after fasting for at least 8 hours.  Glucose, capillary     Status: Abnormal   Collection Time: 09/02/21 11:21 AM  Result Value Ref Range   Glucose-Capillary 145 (H) 70 - 99 mg/dL    Comment: Glucose reference range applies only to samples taken after fasting for at least 8 hours.  CBC with Differential/Platelet     Status: Abnormal   Collection Time: 09/02/21  4:20 PM  Result Value Ref Range   WBC 6.2 4.0 - 10.5  K/uL   RBC 2.80 (L) 4.22 - 5.81 MIL/uL   Hemoglobin 8.2 (L) 13.0 - 17.0 g/dL   HCT 25.7 (L) 39.0 - 52.0 %   MCV 91.8 80.0 - 100.0 fL   MCH 29.3 26.0 - 34.0 pg   MCHC 31.9 30.0 - 36.0 g/dL   RDW 18.2 (H) 11.5 - 15.5 %   Platelets 278 150 - 400 K/uL   nRBC 0.0 0.0 - 0.2 %   Neutrophils Relative % 56 %   Neutro Abs 3.5 1.7 - 7.7 K/uL   Lymphocytes Relative 19 %   Lymphs Abs 1.2 0.7 - 4.0 K/uL   Monocytes Relative 15 %   Monocytes Absolute 1.0 0.1 - 1.0 K/uL   Eosinophils Relative 8 %   Eosinophils Absolute 0.5 0.0 - 0.5 K/uL   Basophils Relative 1 %   Basophils Absolute 0.0 0.0 - 0.1 K/uL   Immature Granulocytes 1 %   Abs Immature Granulocytes 0.04 0.00 - 0.07 K/uL    Comment: Performed at [facility], [address] [place], [place] [address]  Basic metabolic panel     Status: Abnormal   Collection Time: 09/02/21  4:20 PM  Result Value Ref Range   Sodium 133 (L) 135 - 145 mmol/L   Potassium 3.6 3.5 - 5.1 mmol/L   Chloride 105 98 - 111 mmol/L   CO2 21 (L) 22 - 32 mmol/L   Glucose, Bld 217 (H) 70 - 99 mg/dL    Comment: Glucose reference range applies only to samples taken after fasting for at least 8 hours.   BUN 28 (H) 6 - 20 mg/dL   Creatinine, Ser 1.31 (H) 0.61 - 1.24 mg/dL  Calcium 7.9 (L) 8.9 - 10.3 mg/dL   GFR, Estimated >60 >60 mL/min    Comment: (NOTE) Calculated using the CKD-EPI Creatinine Equation (2021)    Anion gap 7 5 - 15    Comment: Performed at [facility], [place] [address] [place], [place] [address]  Glucose, capillary     Status: Abnormal   Collection Time: 09/02/21  4:33 PM  Result Value Ref Range   Glucose-Capillary 187 (H) 70 - 99 mg/dL    Comment: Glucose reference range applies only to samples taken after fasting for at least 8 hours.  Glucose, capillary     Status: Abnormal   Collection Time: 09/02/21  9:00 PM  Result Value Ref Range   Glucose-Capillary 164 (H) 70 - 99 mg/dL    Comment: Glucose reference range applies only to  samples taken after fasting for at least 8 hours.  CBC with Differential/Platelet     Status: Abnormal   Collection Time: 09/03/21  4:04 AM  Result Value Ref Range   WBC 6.4 4.0 - 10.5 K/uL   RBC 2.79 (L) 4.22 - 5.81 MIL/uL   Hemoglobin 8.1 (L) 13.0 - 17.0 g/dL   HCT 25.8 (L) 39.0 - 52.0 %   MCV 92.5 80.0 - 100.0 fL   MCH 29.0 26.0 - 34.0 pg   MCHC 31.4 30.0 - 36.0 g/dL   RDW 18.1 (H) 11.5 - 15.5 %   Platelets 300 150 - 400 K/uL   nRBC 0.0 0.0 - 0.2 %   Neutrophils Relative % 52 %   Neutro Abs 3.4 1.7 - 7.7 K/uL   Lymphocytes Relative 21 %   Lymphs Abs 1.3 0.7 - 4.0 K/uL   Monocytes Relative 17 %   Monocytes Absolute 1.1 (H) 0.1 - 1.0 K/uL   Eosinophils Relative 8 %   Eosinophils Absolute 0.5 0.0 - 0.5 K/uL   Basophils Relative 1 %   Basophils Absolute 0.0 0.0 - 0.1 K/uL   Immature Granulocytes 1 %   Abs Immature Granulocytes 0.03 0.00 - 0.07 K/uL    Comment: Performed at [facility], [address], [place], [place] [address]  Basic metabolic panel     Status: Abnormal   Collection Time: 09/03/21  4:04 AM  Result Value Ref Range   Sodium 134 (L) 135 - 145 mmol/L   Potassium 3.6 3.5 - 5.1 mmol/L   Chloride 105 98 - 111 mmol/L   CO2 23 22 - 32 mmol/L   Glucose, Bld 103 (H) 70 - 99 mg/dL    Comment: Glucose reference range applies only to samples taken after fasting for at least 8 hours.   BUN 24 (H) 6 - 20 mg/dL   Creatinine, Ser 1.33 (H) 0.61 - 1.24 mg/dL   Calcium 8.1 (L) 8.9 - 10.3 mg/dL   GFR, Estimated >60 >60 mL/min    Comment: (NOTE) Calculated using the CKD-EPI Creatinine Equation (2021)    Anion gap 6 5 - 15    Comment: Performed at [facility] [address], [place], [place] [address]  Glucose, capillary     Status: None   Collection Time: 09/03/21  6:01 AM  Result Value Ref Range   Glucose-Capillary 88 70 - 99 mg/dL    Comment: Glucose reference range applies only to samples taken after fasting for at least 8 hours.   No results  found.    Assessment/Plan Sacral pressure wound, unstageable - not concerned for infected pilonidal cyst at this  time - surrounding erythema secondary to pressure not infection  - no leukocytosis and no fevers - recommend PT hydrotherapy and WOC consult, would not recommend surgical debridement at this time - general surgery will sign off  FEN: D3 diet  VTE: SCDs ID: [email or web address] for osteo  - below per primary attending -  T4-5 discitis and osteomyelitis s/p thoracic laminectomy and pediculotomy 08/07/21 MSSA bacteremia  Urinary retention, improving ABL anemia secondary to GI bleeding s/p EGD acute hypoxic respiratory failure requiring intubation, resolved esophagitis AKI T2DM HTN  Norm Parcel, [facility] 09/03/2021, 10:29 AM Please see Amion for pager number during day hours 7:00am-4:30pm

## 2021-09-03 NOTE — Progress Notes (Signed)
Nutrition Follow-up  DOCUMENTATION CODES:   Not applicable  INTERVENTION:  Continue Boost Breeze po TID, each supplement provides 250 kcal and 9 grams of protein   Provide Beneprotein 1 scoop po TID with meal, each scoop provides 25 kcal and 6 grams of protein.   Provide Juven BID, each packet provides 95 calories, 2.5 grams of protein.   Encourage adequate PO intake.   NUTRITION DIAGNOSIS:   Increased nutrient needs related to post-op healing as evidenced by estimated needs; ongoing  GOAL:   Patient will meet greater than or equal to 90% of their needs; progressing  MONITOR:   PO intake, Supplement acceptance, Diet advancement, Labs, Weight trends, Skin, I & O's  REASON FOR ASSESSMENT:   Consult Assessment of nutrition requirement/status  ASSESSMENT:   45 year old male with history of T2DM with neuropathy and retinopathy, CKD, Macular degeneration, GAD. Recent findings of lytic lesions T5,T6 and sternum with pathologic fracture T6 as well as MSSA bacteremia. 10/14 with worsening of pain and found to have increased in paraspinal phelegmon with new epidural phelgmon and interval T5 pathologic fracture with severe left eccentric canal stenosis and overall finding likely secondary to progressive diskitis/osteomyelitis. S/p and T5 laminectomy with left T5 pediculotomy for decompression of spinal cord. 10/17 revealing severe esophagitis with black discoloration of mucosa, large amount of retained coffee ground fluid in stomach which was suctioned as well as few non bleeding superficial duodenal ulcers without stigmata of bleeding. Repeat EGD showing improvement in esophagitis Pt limited by back pain and weakness affecting ADLs and mobility. CIR recommended due to functional decline. Cortrak out 11/5.  Meal completion has been varied from 35-100%. Pt currently has Boost Breeze and Beneprotein ordered and has been consuming them. Noted pt has been refusing [employer] formula. RD to  discontinue [employer]. Will order Juven to aid in wound healing. Pt encouraged to eat his food at meals and to take his supplements.   Labs and medications reviewed.   Diet Order:   Diet Order             DIET DYS 3 Room service appropriate? Yes with Assist; Fluid consistency: Thin  Diet effective now                   EDUCATION NEEDS:   Not appropriate for education at this time  Skin:  Skin Assessment: Reviewed RN Assessment Skin Integrity Issues:: Stage II, DTI, Incisions, Diabetic Ulcer DTI: buttocks Stage II: coccyx Diabetic Ulcer: L big toe Incisions: back  Last BM:  11/9  Height:   Ht Readings from Last 1 Encounters:  08/30/21 5\' 11"  (1.803 m)    Weight:   Wt Readings from Last 1 Encounters:  09/02/21 92.5 kg   BMI:  Body mass index is 28.44 kg/m.  Estimated Nutritional Needs:   Kcal:  2150-2350  Protein:  110-120 grams  Fluid:  >/= 2 L/day  Corrin Parker, MS, RD, LDN RD pager number/after hours weekend pager number on Amion.

## 2021-09-03 NOTE — Progress Notes (Signed)
Occupational Therapy Session Note  Patient Details  Name: Jose [name] MRN: [id number] Date of Birth: 1976/01/17  Today's Date: 09/03/2021 OT Individual Time: 6168-3729 OT Individual Time Calculation (min): 21 min  Missed 9 mins as surgery team enter to assess cyst on pts back side   Short Term Goals: Week 1:  OT Short Term Goal 1 (Week 1): Pt will tolerate sitting EOB ~10 minutes during an ADL session to demonstrate improvement in pain mgt OT Short Term Goal 2 (Week 1): Pt will complete 1/3 components of donning pants with AE as needed OT Short Term Goal 3 (Week 1): Pt will complete LB dressing at sit<stand level using LRAD  Skilled Therapeutic Interventions/Progress Updates:  Pt greeted supine in bed agreeable to OT intervention. Pt continues to report increased pain in low back. Pt declined OOB mobility as this time. Session focused on therapeutic use of self, active listening and self care reeducation. Pt reports being encouraged that he no longer has to be cathed and was excited that he was able to face time with his wife. Pt reports feeling optimistic that if pt can concur some mental stressors perhaps pt will have more physical/ mobility successes. Offered active listening and emotional support. Continued to encourage pt to work attempting OOB mobility, offered suggestions for OOB activity, even mentioned getting pt tilt bed if needed.  Pt was agreeable to bed level grooming tasks with pt completing tasks with set- up assist. Surgery team enter during session, missed 9 mins of session but did assist pt with rolling to L side with MODA so PA could assess back side. Pt left supine in bed with bed alarm activated and all needs within reach.    Therapy Documentation Precautions:  Precautions Precautions: Fall, Back Precaution Booklet Issued:  (verbally reviewed post-up back precautions) Precaution Comments: 2L oxygen, high pain levels, PICC line Restrictions Weight Bearing  Restrictions: No  Pain: pt reports 7-8/10 pain in low back, offered rest breaks as needed.     Therapy/Group: Individual Therapy  [name] 09/03/2021, 12:46 PM

## 2021-09-03 NOTE — Progress Notes (Addendum)
PROGRESS NOTE   Subjective/Complaints:  Therapy went better with MS Contin- better mix of meds.  Urinated x2- 1st time still needed cathing; 2nd time, didn't need cathing afterwards.  Slept a little better with Remeron.   Pilonidal cyst still very painful/swollen- and very difficult to sit with it- therapy trying to come up with "donut" to make it easier to sit.    Per PT, thinks Ue's have cogwheeling/tone.      ROS:  Pt denies SOB, abd pain, CP, N/V/C/D, and vision changes    Objective:   No results found. Recent Labs    09/02/21 1620 09/03/21 0404  WBC 6.2 6.4  HGB 8.2* 8.1*  HCT 25.7* 25.8*  PLT 278 300   Recent Labs    09/02/21 1620 09/03/21 0404  NA 133* 134*  K 3.6 3.6  CL 105 105  CO2 21* 23  GLUCOSE 217* 103*  BUN 28* 24*  CREATININE 1.31* 1.33*  CALCIUM 7.9* 8.1*    Intake/Output Summary (Last 24 hours) at 09/03/2021 1013 Last data filed at 09/03/2021 0400 Gross per 24 hour  Intake 742.53 ml  Output 4787 ml  Net -4044.47 ml     Pressure Injury 08/10/21 Coccyx Medial Stage 2 -  Partial thickness loss of dermis presenting as a shallow open injury with a red, pink wound bed without slough. (Active)  08/10/21 1700  Location: Coccyx (gluteal crease)  Location Orientation: Medial  Staging: Stage 2 -  Partial thickness loss of dermis presenting as a shallow open injury with a red, pink wound bed without slough.  Wound Description (Comments):   Present on Admission: Yes     Pressure Injury 08/14/21 Buttocks Bilateral Deep Tissue Pressure Injury - Purple or maroon localized area of discolored intact skin or blood-filled blister due to damage of underlying soft tissue from pressure and/or shear. DTI (Active)  08/14/21 0800  Location: Buttocks  Location Orientation: Bilateral  Staging: Deep Tissue Pressure Injury - Purple or maroon localized area of discolored intact skin or blood-filled  blister due to damage of underlying soft tissue from pressure and/or shear.  Wound Description (Comments): DTI  Present on Admission:     Physical Exam: Vital Signs Blood pressure 140/74, pulse 73, temperature 98.6 F (37 C), temperature source Oral, resp. rate 18, height 5\' 11"  (1.803 m), weight 92.5 kg, SpO2 96 %.       General: awake, alert, appropriate,  laying in bed; appeared pain skyrocketed to 8-10/10 when trying to turn over on side in bed- couldn't turn completely on side; NAD HENT: conjugate gaze; oropharynx moist CV: regular rate; no JVD Pulmonary: CTA B/L; no W/R/R- good air movement- decreased at bases; O2 by [place] GI: soft, NT, ND, (+)BS- hypoactive Psychiatric: appropriate- sad/flat affect Neurological: Ox3 Skin- skin open/raw around pilonidal cyst on backside motor strength is 5/5 in bilateral deltoid, bicep, tricep, grip, 4-/5 hip flexor, knee extensors, 3/5 ankle dorsiflexor and plantar flexor Sensory exam reduced LT and Proprioception bilateral feet MS: no cogwheeling when back to examine- just pain with ROM   Assessment/Plan: 1. Functional deficits which require 3+ hours per day of interdisciplinary therapy in a comprehensive inpatient rehab setting. Physiatrist is  providing close team supervision and 24 hour management of active medical problems listed below. Physiatrist and rehab team continue to assess barriers to discharge/monitor patient progress toward functional and medical goals  Care Tool:  Bathing    Body parts bathed by patient: Right arm, Left arm, Chest, Abdomen, Front perineal area, Buttocks, Face   Body parts bathed by helper: Right upper leg, Left upper leg, Right lower leg, Left lower leg     Bathing assist Assist Level: Maximal Assistance - Patient 24 - 49%     Upper Body Dressing/Undressing Upper body dressing   What is the patient wearing?: Pull over shirt    Upper body assist Assist Level: Maximal Assistance - Patient 25 - 49%     Lower Body Dressing/Undressing Lower body dressing      What is the patient wearing?: Pants, Underwear/pull up     Lower body assist Assist for lower body dressing: Maximal Assistance - Patient 25 - 49%     Toileting Toileting Toileting Activity did not occur (Clothing management and hygiene only): N/A (no void or bm) (pt had to be cathed)  Toileting assist Assist for toileting: 2 Helpers     Transfers Chair/bed transfer  Transfers assist  Chair/bed transfer activity did not occur: Safety/medical concerns  Chair/bed transfer assist level: 2 Helpers     Locomotion Ambulation   Ambulation assist   Ambulation activity did not occur: Safety/medical concerns          Walk 10 feet activity   Assist  Walk 10 feet activity did not occur: Safety/medical concerns        Walk 50 feet activity   Assist Walk 50 feet with 2 turns activity did not occur: Safety/medical concerns         Walk 150 feet activity   Assist Walk 150 feet activity did not occur: Safety/medical concerns         Walk 10 feet on uneven surface  activity   Assist Walk 10 feet on uneven surfaces activity did not occur: Safety/medical concerns         Wheelchair     Assist Is the patient using a wheelchair?: Yes Type of Wheelchair: Manual Wheelchair activity did not occur: Safety/medical concerns         Wheelchair 50 feet with 2 turns activity    Assist    Wheelchair 50 feet with 2 turns activity did not occur: Safety/medical concerns       Wheelchair 150 feet activity     Assist  Wheelchair 150 feet activity did not occur: Safety/medical concerns       Blood pressure 140/74, pulse 73, temperature 98.6 F (37 C), temperature source Oral, resp. rate 18, height 5\' 11"  (1.803 m), weight 92.5 kg, SpO2 96 %.    Medical Problem List and Plan: 1.  Decline in self care and mobility  secondary to thoracic myelopathy/thoracic osteomyelitis.               -patient may  shower             -ELOS/Goals: 10-12d  Con't PT and OT_ dteam conference today- d/c 3 weeks or so since pain so limiting.    11/10- pain is biggest limiter- working on it- con't PT and OT 2.  Antithrombotics: -DVT/anticoagulation:  N/A             -antiplatelet therapy: none 3. Pain Management: Oxycodone prn. 11/8- will increase oxy to q4 hours prn- might need long acting?  11/9- will add MS Contin 15 mg BID for pain- and monitor  11/10- pain a little better- but pilonidal cyst is also adding to it/pain wise and hard to tolerate sitting for any length of time- calling gen surgery 4. Mood: LCSW to follow for evaluation and support.              -antipsychotic agents: N/A 5. Neuropsych: This patient is intermittently capable of making decisions on his own behalf. 6. Skin/Wound Care: Air mattress for pressure relief measures.  7. Fluids/Electrolytes/Nutrition: Monitor I/O. Check lytes in am.  8. MSSA bacteremia: On ancef with end date 12/01 9. UGIB/melena: Has received 11 units PRBC during admission --Continue protonix BID w/ carafate ac/hs.  --Monitor for signs of bleeding.  11/9- will recheck labs today at 3pm- to make sure not dropping more since is s/p so many units of pRBCs.   11/10- Hb back up to 8.1 this AM- will con'tto monitor 10. T2DM: Po intake poor --continue TF at nights. Continue insulin gargline 12 units at nights --monitor BS every 4 hours with SSI 11/7- BG's look great- still poor appetite, but I think thrush is making it worse- things "taste weird". Con't to monitor and regimen 11/10- BG's 88-184- labile, but usually better cotnrolled- con't regimen 11. HTN: Monitor BP TID--continue Norvasc and metoprolol for control.   11/7- BP somewhat elevated in 270W- systolic- will monitor for trend.  12. ABLA: On aranesp weekly for supplement.  11/7- Hb stable at 8.4- con't to monitor  11/9- Hb today is 7.6- down from 8.4- will recheck in AM to verify- hemodilution?    11/10- back up to 8.1- will monitor 13. Acute renal insufficiency- 11/7- improving- Cr down to 1.37 and BUN down to 37-   11/9- will recheck in AM- is stable last time   11/10- Cr 1.33- BUN 24 14. O2 need/acute resp failure  11/7- will wean O2 as able 15. Persistent pulmonary edema  11/9- will give Lasix 40 mg daily x 3 days and KCL 40 mEq x3 days as well since K+ is borderline low.  2. Dispo  11/7- wife left note asking for "CNS w/u and endo work up as well as some more esoteric dx's"- will need to discuss with her before order any work up.    16. Urinary retention- 11/8- will start Flomax- foley out- in/out caths- will do allergy test to see if use lidocaine jelly- if so, order.   11/10- peeing some- con't regimen 17. Pilonidal cyst  11/10- will call gen surgery since so painful -see if can be opened up and drained?  I spent a total of 40 minutes on total care- >50% coordination of care- - d/w PA and second visit to pt to discuss concern for tone in Ue's. Also spoke with PT  LOS: 4 days A FACE TO FACE EVALUATION WAS PERFORMED  Wiktoria Hemrick 09/03/2021, 10:13 AM

## 2021-09-04 DIAGNOSIS — M4714 Other spondylosis with myelopathy, thoracic region: Secondary | ICD-10-CM

## 2021-09-04 LAB — GLUCOSE, CAPILLARY
Glucose-Capillary: 135 mg/dL — ABNORMAL HIGH (ref 70–99)
Glucose-Capillary: 63 mg/dL — ABNORMAL LOW (ref 70–99)
Glucose-Capillary: 77 mg/dL (ref 70–99)
Glucose-Capillary: 109 mg/dL — ABNORMAL HIGH (ref 70–99)
Glucose-Capillary: 220 mg/dL — ABNORMAL HIGH (ref 70–99)
Glucose-Capillary: 139 mg/dL — ABNORMAL HIGH (ref 70–99)
Glucose-Capillary: 108 mg/dL — ABNORMAL HIGH (ref 70–99)

## 2021-09-04 NOTE — Progress Notes (Signed)
Occupational Therapy Session Note  Patient Details  Name: Jose [name] MRN: [id number] Date of Birth: [birth year]  Today's Date: 09/04/2021 OT Individual Time: 1505-1600 OT Individual Time Calculation (min): 55 min    Short Term Goals: Week 1:  OT Short Term Goal 1 (Week 1): Pt will tolerate sitting EOB ~10 minutes during an ADL session to demonstrate improvement in pain mgt OT Short Term Goal 2 (Week 1): Pt will complete 1/3 components of donning pants with AE as needed OT Short Term Goal 3 (Week 1): Pt will complete LB dressing at sit<stand level using LRAD  Skilled Therapeutic Interventions/Progress Updates:    Pt in bed to start session and throughout secondary to not wanting to try and get up to the EOB.  He did request UB strengthening exercises this session which were completed from supine position.  He was able to complete 2 sets of 10 reps for elbow flexion, elbow extension, internal and external rotation, and shoulder horizontal abduction.  Blued cord resistance band with handles was utilized for each exercise.  Less resistance was applied to the RUE throughout all movements secondary to pt having greater pain in the posterior aspects of the arm and shoulder.  He completed 2 sets of 10 reps shoulder flexion with the band on the LUE and holding a water bottle on the left.  He also completed 2 sets of 20 reps for shoulder row in BUEs using the band.  Oxygen sats at 95% on 2Ls nasal cannula with HR at 85 post exercise.  Issued pt foam block as well for work on gross digit flexion as well as tip to tip pinch and in hand manipulation.  Pt left in bed with the call button and phone in reach at end of session.     Therapy Documentation Precautions:  Precautions Precautions: Fall, Back Precaution Booklet Issued:  (verbally reviewed post-up back precautions) Precaution Comments: 2L oxygen, high pain levels, PICC line Restrictions Weight Bearing Restrictions: No  Pain: Pain  Assessment Pain Scale: 0-10 Pain Score: 7  Pain Type: Acute pain;Surgical pain Pain Location: Back Pain Orientation: Lower Pain Descriptors / Indicators: Discomfort Pain Onset: On-going Pain Intervention(s): Repositioned   Therapy/Group: Individual Therapy  Mckay Tegtmeyer OTR/L 09/04/2021, 4:28 PM

## 2021-09-04 NOTE — Progress Notes (Signed)
Pt refused I/O cath at 0525. Bladder scan volume 438 ml. Pt stated he will try to void after eating breakfast. Pt educated and passed on to next shift to reinforced education.

## 2021-09-04 NOTE — Progress Notes (Signed)
Physical Therapy Wound Treatment Patient Details  Name: Jose [name] MRN: [id number] Date of Birth: 04-Jul-1976  Today's Date: 09/04/2021 PT Individual Time: 1029-1122 PT Individual Time Calculation (min): 53 min   Subjective  Subjective: It's all in my back (pain) not really the wound so much. Patient and Family Stated Goals: get the wound healed and do well with therapies. Date of Onset:  (unknown) Prior Treatments:  (unknown)  Pain Score: 4-6/10 after premedication  Wound Assessment  Pressure Injury 08/10/21 Coccyx Medial Stage 2 -  Partial thickness loss of dermis presenting as a shallow open injury with a red, pink wound bed without slough. (Active)  Wound Image    09/04/21 1318  Dressing Type Barrier Film (skin prep);Foam - Lift dressing to assess site every shift;Gauze (Comment);Moist to dry;Normal saline moist dressing;Santyl 09/04/21 1318  Dressing Clean;Dry;Intact 09/04/21 1318  Dressing Change Frequency Daily 09/04/21 1318  State of Healing Eschar 09/04/21 1318  Site / Wound Assessment Yellow 09/04/21 1318  % Wound base Red or Granulating 0% 09/04/21 1318  % Wound base Yellow/Fibrinous Exudate 100% 09/04/21 1318  % Wound base Black/Eschar 0% 09/04/21 1318  % Wound base Other/Granulation Tissue (Comment) 0% 09/04/21 1318  Peri-wound Assessment Excoriated;Erythema (blanchable);Intact 09/04/21 1318  Wound Length (cm) 6 cm 09/04/21 1318  Wound Width (cm) 3.5 cm 09/04/21 1318  Wound Depth (cm) 1 cm 09/04/21 1318  Wound Surface Area (cm^2) 21 cm^2 09/04/21 1318  Wound Volume (cm^3) 21 cm^3 09/04/21 1318  Margins Unattached edges (unapproximated) 09/04/21 1318  Drainage Amount Minimal 09/04/21 1318  Drainage Description Sanguineous 09/04/21 1318  Treatment Cleansed;Hydrotherapy (Pulse lavage);Debridement (Selective);Packing (Saline gauze) 09/04/21 1318      Hydrotherapy Pulsed lavage therapy - wound location: sacral/coccyx Pulsed Lavage with Suction (psi): 12  psi Pulsed Lavage with Suction - Normal Saline Used: 1000 mL Pulsed Lavage Tip: Tip with splash shield Selective Debridement Selective Debridement - Location: sacral/coccyx Selective Debridement - Tools Used: Scalpel;Forceps Selective Debridement - Tissue Removed: Thick yellow eschar covering wound base fully   Wound Assessment and Plan  Wound Therapy - Assess/Plan/Recommendations Wound Therapy - Clinical Statement: Pt can benefit from hydrotherapy for pulsed lavage to soften up the thick eschar to allow for selective debridement, Then PLS for it's benefit of decrease bacterial burdent. Wound Therapy - Functional Problem List: pain limiting bed mobility and OOB activity. Factors Delaying/Impairing Wound Healing: Diabetes Mellitus;Multiple medical problems Hydrotherapy Plan: Debridement;Dressing change;Pulsatile lavage with suction;Patient/family education Wound Therapy - Frequency: 3X / week Wound Therapy - Current Recommendations: PT Wound Therapy - Follow Up Recommendations: dressing changes by RN;dressing changes by family/patient  Wound Therapy Goals- Improve the function of patient's integumentary system by progressing the wound(s) through the phases of wound healing (inflammation - proliferation - remodeling) by: Decrease Necrotic Tissue to: 20% Decrease Necrotic Tissue - Progress: Goal set today Increase Granulation Tissue to: 80% Increase Granulation Tissue - Progress: Goal set today Improve Drainage Characteristics: Min;Serous Improve Drainage Characteristics - Progress: Goal set today Goals/treatment plan/discharge plan were made with and agreed upon by patient/family: Yes Time For Goal Achievement: 7 days Wound Therapy - Potential for Goals: Good  Goals will be updated until maximal potential achieved or discharge criteria met.  Discharge criteria: when goals achieved, discharge from hospital, MD decision/surgical intervention, no progress towards goals, refusal/missing three  consecutive treatments without notification or medical reason.  GP     Tessie Fass  09/04/2021, 1:33 PM 09/04/2021  Ginger Carne., PT Acute Rehabilitation Services [phone]  (pager) [phone]  (office)

## 2021-09-04 NOTE — Plan of Care (Signed)
  Problem: Consults Goal: RH SPINAL CORD INJURY PATIENT EDUCATION Description:  See Patient Education module for education specifics.  Outcome: Progressing Goal: Skin Care Protocol Initiated - if Braden Score 18 or less Description: If consults are not indicated, leave blank or document N/A Outcome: Progressing Goal: Diabetes Guidelines if Diabetic/Glucose > 140 Description: If diabetic or lab glucose is > 140 mg/dl - Initiate Diabetes/Hyperglycemia Guidelines & Document Interventions  Outcome: Progressing   Problem: SCI BOWEL ELIMINATION Goal: RH STG MANAGE BOWEL WITH ASSISTANCE Description: STG Manage Bowel with min Assistance. Outcome: Progressing Goal: RH STG SCI MANAGE BOWEL WITH MEDICATION WITH ASSISTANCE Description: STG SCI Manage bowel with medication with min assistance. Outcome: Progressing Goal: RH STG SCI MANAGE BOWEL PROGRAM W/ASSIST OR AS APPROPRIATE Description: STG SCI Manage bowel program with min assist or as appropriate. Outcome: Progressing   Problem: SCI BLADDER ELIMINATION Goal: RH STG MANAGE BLADDER WITH ASSISTANCE Description: STG Manage Bladder With min Assistance Outcome: Progressing Goal: RH STG MANAGE BLADDER WITH MEDICATION WITH ASSISTANCE Description: STG Manage Bladder With Medication With min Assistance. Outcome: Progressing Goal: RH STG MANAGE BLADDER WITH EQUIPMENT WITH ASSISTANCE Description: STG Manage Bladder With Equipment With min Assistance Outcome: Progressing   Problem: RH SKIN INTEGRITY Goal: RH STG SKIN FREE OF INFECTION/BREAKDOWN Description: Skin to remain free from additional breakdown with min assist. Outcome: Progressing Goal: RH STG MAINTAIN SKIN INTEGRITY WITH ASSISTANCE Description: STG Maintain Skin Integrity With min Assistance. Outcome: Progressing   Problem: RH SAFETY Goal: RH STG ADHERE TO SAFETY PRECAUTIONS W/ASSISTANCE/DEVICE Description: STG Adhere to Safety Precautions With cues and reminders. Outcome:  Progressing   Problem: RH PAIN MANAGEMENT Goal: RH STG PAIN MANAGED AT OR BELOW PT'S PAIN GOAL Description: < 2 on a 0-10 pain scale. Outcome: Progressing   Problem: RH KNOWLEDGE DEFICIT SCI Goal: RH STG INCREASE KNOWLEDGE OF SELF CARE AFTER SCI Description: Patient will demonstrate knowledge of medication/pain management, bowel/bladder program, and weight  bearing precautions with educational materials and handouts provided by staff independently at discharge. Outcome: Progressing   Problem: RH BOWEL ELIMINATION Goal: RH STG MANAGE BOWEL W/MEDICATION W/ASSISTANCE Description: STG Manage Bowel with Medication with Assistance. Outcome: Progressing   Problem: RH BLADDER ELIMINATION Goal: RH STG MANAGE BLADDER WITH MEDICATION WITH ASSISTANCE Description: STG Manage Bladder With Medication With Assistance. Outcome: Progressing   Problem: RH SKIN INTEGRITY Goal: RH STG SKIN FREE OF INFECTION/BREAKDOWN Outcome: Progressing Goal: RH STG MAINTAIN SKIN INTEGRITY WITH ASSISTANCE Description: STG Maintain Skin Integrity With Assistance. Outcome: Progressing Goal: RH STG ABLE TO PERFORM INCISION/WOUND CARE W/ASSISTANCE Description: STG Able To Perform Incision/Wound Care With Assistance. Outcome: Progressing   Problem: RH SAFETY Goal: RH STG DECREASED RISK OF FALL WITH ASSISTANCE Description: STG Decreased Risk of Fall With Assistance. Outcome: Progressing   Problem: RH PAIN MANAGEMENT Goal: RH STG PAIN MANAGED AT OR BELOW PT'S PAIN GOAL Outcome: Progressing   Problem: RH KNOWLEDGE DEFICIT GENERAL Goal: RH STG INCREASE KNOWLEDGE OF SELF CARE AFTER HOSPITALIZATION Outcome: Progressing

## 2021-09-04 NOTE — Progress Notes (Signed)
Pt. Refused I/o cath after bladder scanned for 825 ml  at 2230. Educated pt. On the risk involved and encouraged to void. Pt. Stated he needs time to void. Reinforced teaching.  1230 am bladder scanned for 999. I/O  cath pt for 950 ml. Pt tolerated well.

## 2021-09-04 NOTE — Progress Notes (Signed)
PROGRESS NOTE   Subjective/Complaints:  Pt reports low loss air mattress has been MUCH better for backside pain.  Huge difference.  Lidocaine test was (-) so using lidocaine for caths.  Cathing still painful- but slightly better with lidocaine.  Peed 600cc, but cathed for 900c- depressing.   LBM 3 days ago- per chart documentation- thinks was 2 days ago.   ROS:  Pt denies SOB, abd pain, CP, N/V/C/D, and vision changes     Objective:   No results found. Recent Labs    09/02/21 1620 09/03/21 0404  WBC 6.2 6.4  HGB 8.2* 8.1*  HCT 25.7* 25.8*  PLT 278 300   Recent Labs    09/02/21 1620 09/03/21 0404  NA 133* 134*  K 3.6 3.6  CL 105 105  CO2 21* 23  GLUCOSE 217* 103*  BUN 28* 24*  CREATININE 1.31* 1.33*  CALCIUM 7.9* 8.1*    Intake/Output Summary (Last 24 hours) at 09/04/2021 0816 Last data filed at 09/04/2021 0030 Gross per 24 hour  Intake 10 ml  Output 2945 ml  Net -2935 ml     Pressure Injury 08/10/21 Coccyx Medial Stage 2 -  Partial thickness loss of dermis presenting as a shallow open injury with a red, pink wound bed without slough. (Active)  08/10/21 1700  Location: Coccyx (gluteal crease)  Location Orientation: Medial  Staging: Stage 2 -  Partial thickness loss of dermis presenting as a shallow open injury with a red, pink wound bed without slough.  Wound Description (Comments):   Present on Admission: Yes     Pressure Injury 08/14/21 Buttocks Bilateral Deep Tissue Pressure Injury - Purple or maroon localized area of discolored intact skin or blood-filled blister due to damage of underlying soft tissue from pressure and/or shear. DTI (Active)  08/14/21 0800  Location: Buttocks  Location Orientation: Bilateral  Staging: Deep Tissue Pressure Injury - Purple or maroon localized area of discolored intact skin or blood-filled blister due to damage of underlying soft tissue from pressure and/or  shear.  Wound Description (Comments): DTI  Present on Admission:     Physical Exam: Vital Signs Blood pressure (!) 158/87, pulse 75, temperature 98.5 F (36.9 C), temperature source Oral, resp. rate 18, height 5\' 11"  (1.803 m), weight 93 kg, SpO2 99 %.       General: awake, alert, appropriate, laying in bed; supine; on O2 2L by [place]; NAD HENT: conjugate gaze; oropharynx moist CV: regular rate; no JVD Pulmonary: good air movement- however decreased at bases; CTA B/L GI: soft, NT, ND, (+)BS- hypoactive Psychiatric: appropriate- sad/anxious, flat; but interactive Neurological: Ox3 Skin- skin open/raw around unstageable pressure ulcer motor strength is 5/5 in bilateral deltoid, bicep, tricep, grip, 4-/5 hip flexor, knee extensors, 3/5 ankle dorsiflexor and plantar flexor Sensory exam reduced LT and Proprioception bilateral feet MS: no cogwheeling when back to examine- just pain with ROM   Assessment/Plan: 1. Functional deficits which require 3+ hours per day of interdisciplinary therapy in a comprehensive inpatient rehab setting. Physiatrist is providing close team supervision and 24 hour management of active medical problems listed below. Physiatrist and rehab team continue to assess barriers to discharge/monitor patient progress toward functional  and medical goals  Care Tool:  Bathing    Body parts bathed by patient: Right arm, Left arm, Chest, Abdomen, Front perineal area, Buttocks, Face   Body parts bathed by helper: Right upper leg, Left upper leg, Right lower leg, Left lower leg     Bathing assist Assist Level: Maximal Assistance - Patient 24 - 49%     Upper Body Dressing/Undressing Upper body dressing   What is the patient wearing?: Pull over shirt    Upper body assist Assist Level: Total Assistance - Patient < 25%    Lower Body Dressing/Undressing Lower body dressing      What is the patient wearing?: Pants, Underwear/pull up     Lower body assist Assist  for lower body dressing: Maximal Assistance - Patient 25 - 49%     Toileting Toileting Toileting Activity did not occur (Clothing management and hygiene only): N/A (no void or bm) (pt had to be cathed)  Toileting assist Assist for toileting: 2 Helpers     Transfers Chair/bed transfer  Transfers assist  Chair/bed transfer activity did not occur: Safety/medical concerns  Chair/bed transfer assist level: 2 Helpers     Locomotion Ambulation   Ambulation assist   Ambulation activity did not occur: Safety/medical concerns          Walk 10 feet activity   Assist  Walk 10 feet activity did not occur: Safety/medical concerns        Walk 50 feet activity   Assist Walk 50 feet with 2 turns activity did not occur: Safety/medical concerns         Walk 150 feet activity   Assist Walk 150 feet activity did not occur: Safety/medical concerns         Walk 10 feet on uneven surface  activity   Assist Walk 10 feet on uneven surfaces activity did not occur: Safety/medical concerns         Wheelchair     Assist Is the patient using a wheelchair?: Yes Type of Wheelchair: Manual Wheelchair activity did not occur: Safety/medical concerns         Wheelchair 50 feet with 2 turns activity    Assist    Wheelchair 50 feet with 2 turns activity did not occur: Safety/medical concerns       Wheelchair 150 feet activity     Assist  Wheelchair 150 feet activity did not occur: Safety/medical concerns       Blood pressure (!) 158/87, pulse 75, temperature 98.5 F (36.9 C), temperature source Oral, resp. rate 18, height 5\' 11"  (1.803 m), weight 93 kg, SpO2 99 %.    Medical Problem List and Plan: 1.  Decline in self care and mobility  secondary to thoracic myelopathy/thoracic osteomyelitis.              -patient may  shower             -ELOS/Goals: 10-12d  Con't PT and OT_ dteam conference today- d/c 3 weeks or so since pain so limiting.     11/11- therapy working on seating due to unstageable pressure ulcer- con't PT and OT 2.  Antithrombotics: -DVT/anticoagulation:  N/A             -antiplatelet therapy: none 3. Pain Management: Oxycodone prn. 11/8- will increase oxy to q4 hours prn- might need long acting?   11/9- will add MS Contin 15 mg BID for pain- and monitor  11/11- pain much better with seating/new low air loss mattress- con't  regimen 4. Mood: LCSW to follow for evaluation and support.              -antipsychotic agents: N/A 5. Neuropsych: This patient is intermittently capable of making decisions on his own behalf. 6. Skin/Wound Care: Air mattress for pressure relief measures.  7. Fluids/Electrolytes/Nutrition: Monitor I/O. Check lytes in am.  8. MSSA bacteremia: On ancef with end date 12/01 9. UGIB/melena: Has received 11 units PRBC during admission --Continue protonix BID w/ carafate ac/hs.  --Monitor for signs of bleeding.  11/9- will recheck labs today at 3pm- to make sure not dropping more since is s/p so many units of pRBCs.   11/10- Hb back up to 8.1 this AM- will con'tto monitor 10. T2DM: Po intake poor --continue TF at nights. Continue insulin gargline 12 units at nights --monitor BS every 4 hours with SSI 11/7- BG's look great- still poor appetite, but I think thrush is making it worse- things "taste weird". Con't to monitor and regimen 11/10- BG's 88-184- labile, but usually better cotnrolled- con't regimen 11. HTN: Monitor BP TID--continue Norvasc and metoprolol for control.   11/7- BP somewhat elevated in 119E-  systolic- will monitor for trend.   11/11- BP was doing better, but back up to 150s SBP today- con't to monitor 12. ABLA: On aranesp weekly for supplement.  11/7- Hb stable at 8.4- con't to monitor  11/9- Hb today is 7.6- down from 8.4- will recheck in AM to verify- hemodilution?   11/10- back up to 8.1- will monitor 13. Acute renal insufficiency- 11/7- improving- Cr down to 1.37 and  BUN down to 37-   11/9- will recheck in AM- is stable last time   11/10- Cr 1.33- BUN 24 14. O2 need/acute resp failure  11/7- will wean O2 as able 15. Persistent pulmonary edema  11/9- will give Lasix 40 mg daily x 3 days and KCL 40 mEq x3 days as well since K+ is borderline low.   11/11- will xray again Monday and wean O2 as tolerated 16. Dispo  11/7- wife left note asking for "CNS w/u and endo work up as well as some more esoteric dx's"- will need to discuss with her before order any work up.    16. Urinary retention- 11/8- will start Flomax- foley out- in/out caths- will do allergy test to see if use lidocaine jelly- if so, order.   11/10- peeing some- con't regimen 17. Pressure ulcer on sacrum/coccyx- unstageable  11/10- will call gen surgery since so painful -see if can be opened up and drained?  11/11- ordered [facility]_ they are starting hydrotherapy M/W/F and wound care by nursing with santyl and saline gauze and foam dressing.  18. Urinary retention  11/11- on Flomax 0.8 mg nightly- peeing some, but still needing caths- will change to q4 hours due to volumes    LOS: 5 days A FACE TO FACE EVALUATION WAS PERFORMED  Dorrien Grunder 09/04/2021, 8:16 AM

## 2021-09-04 NOTE — Progress Notes (Signed)
Pt was bladder scanned for 923. Refused I/O cath. Voided 300 ml. Pt educated on the significance/risk involved. 0245: Bladder scanned 613 ml. Refused I/O cath. Pt stated he will void in the morning and do not want to be cath. Educated patient and the risk involved.  8453: Bladder scanned 999 ml. I/O Cath 1475 ml. Pt tolerated with some verbalization of pain. No adverse effect noted.

## 2021-09-04 NOTE — Progress Notes (Signed)
Physical Therapy Session Note  Patient Details  Name: Jose [name] MRN: [id number] Date of Birth: Mar 09, 1976  Today's Date: 09/04/2021 PT Individual Time: 1029-1122 PT Individual Time Calculation (min): 53 min   Short Term Goals: Week 1:  PT Short Term Goal 1 (Week 1): Pt will complete bed mobility with minA and LRAD PT Short Term Goal 2 (Week 1): Pt will complete bed<>chair transfers with modA and LRAD PT Short Term Goal 3 (Week 1): Pt will tolerate sitting in recliner or TIS w/c for > 2 hours outside of therapies PT Short Term Goal 4 (Week 1): Pt will ambulate 56ft with modA and LRAD  Skilled Therapeutic Interventions/Progress Updates:    pt received in bed and agreeable to therapy. Pt premedicated for pain, reports 7/10 pain with activity, no further intervention required. Therapist provided passive ankle DF stretch with AP talocrural glide and hamstring stretch with rhythmic rotation to decrease tone, 2 x 30 sec BIL. Therapist and pt felt small pop at talus. Pt reported no pain but incr ROM and incr stretch. Therapist noted improved glide at talus BIL from previous day.  Pt performed the following exercises to promote LE strength and endurance:  -SLR x 10 - Supine SL "squats" with resistance band 4 x 8 BIL -supine BIL hip abduction with band 3 x 10  Pt requested further hip stretches to reduce tension prior to hydrotherapy, therapist provided hip IR/ER stretching. Noted limitation in IR, only able to get to neutral with gentle overpressure. Cogwheel rigidity noted in IR but not ER on L side, general tightness on R side. Biceps stretch with forearm supination, tone calmed with deep pressure over muscle belly, L>R. Pt remained in bed at end of session and was left with all needs in reach and alarm active.    Therapy Documentation Precautions:  Precautions Precautions: Fall, Back Precaution Booklet Issued:  (verbally reviewed post-up back precautions) Precaution Comments: 2L oxygen,  high pain levels, PICC line Restrictions Weight Bearing Restrictions: No   Therapy/Group: Individual Therapy  [name] 09/04/2021, 11:00 AM

## 2021-09-05 LAB — GLUCOSE, CAPILLARY
Glucose-Capillary: 67 mg/dL — ABNORMAL LOW (ref 70–99)
Glucose-Capillary: 77 mg/dL (ref 70–99)
Glucose-Capillary: 181 mg/dL — ABNORMAL HIGH (ref 70–99)
Glucose-Capillary: 208 mg/dL — ABNORMAL HIGH (ref 70–99)
Glucose-Capillary: 163 mg/dL — ABNORMAL HIGH (ref 70–99)

## 2021-09-05 MED ORDER — INSULIN GLARGINE-YFGN 100 UNIT/ML ~~LOC~~ SOLN
10.0000 [IU] | Freq: Every day | SUBCUTANEOUS | Status: DC
  Administered 2021-09-05 – 2021-09-08 (×4): 10 [IU] via SUBCUTANEOUS
  Filled 2021-09-05 (×5): qty 0.1

## 2021-09-05 NOTE — Progress Notes (Signed)
2043: Bladder scanned for 433 ml. Pt refused I/O cath. Stated he will try to void. Educated pt. And reinforced education.  0017: Pt voided 280 ml. PVR was 384. Refused I/O Cath and educated pt.  0440: Pt voided 380 ml. PVR was 224

## 2021-09-05 NOTE — Progress Notes (Signed)
Hypoglycemic Event  CBG: 67  Treatment: 4 oz juice  Symptoms: asymptomatic  Follow-up CBG: [id number] CBG Result:77  Possible Reasons for Event: unknown  Comments/MD notified:/charge nurse notified.    Jose [name]

## 2021-09-05 NOTE — Progress Notes (Signed)
Occupational Therapy Session Note  Patient Details  Name: Jose [name] MRN: [id number] Date of Birth: Apr 03, 1976  Today's Date: 09/06/2021 OT Individual Time: 1401-1500 OT Individual Time Calculation (min): 59 min   Short Term Goals: Week 1:  OT Short Term Goal 1 (Week 1): Pt will tolerate sitting EOB ~10 minutes during an ADL session to demonstrate improvement in pain mgt OT Short Term Goal 2 (Week 1): Pt will complete 1/3 components of donning pants with AE as needed OT Short Term Goal 3 (Week 1): Pt will complete LB dressing at sit<stand level using LRAD  Skilled Therapeutic Interventions/Progress Updates:    Pt greeted in bed, premedicated for back pain. Pt declining EOB or OOB activity due to pain, wanting to "save" his back for therapies tomorrow. He was unable to tolerate HOB raised for participation, therefore OT set up 2 fine motor activities while pt was semi reclined. Pt used both hands to meet task demands of assembling 3 peg board puzzles and then disassembling. Note that pt had a hard time distinguishing between reds/oranges and blues/greens. He attributes this to visual problems from DM. Graded clothespin activity also completed with vcs for 3 jaw chuck and pincer grasping patterns. He had a difficult time manipulating the black clothespins but required no physical assistance during activity. At end of session pt remained in bed, all needs within reach. Tx focus placed on functional hand skills needed to increase functional independence with ADL/IADL.   Therapy Documentation Precautions:  Precautions Precautions: Fall, Back Precaution Booklet Issued:  (verbally reviewed post-up back precautions) Precaution Comments: 2L oxygen, high pain levels, PICC line Restrictions Weight Bearing Restrictions: No ADL: ADL Eating: Dependent (per most recent staff documentation) Grooming: Maximal assistance Where Assessed-Grooming: Edge of bed Upper Body Bathing:  Supervision/safety Where Assessed-Upper Body Bathing: Edge of bed Lower Body Bathing: Maximal assistance Where Assessed-Lower Body Bathing: Edge of bed, Bed level Upper Body Dressing: Maximal assistance Where Assessed-Upper Body Dressing: Edge of bed Lower Body Dressing: Maximal assistance, Dependent Where Assessed-Lower Body Dressing: Bed level Toileting: Not assessed Toilet Transfer: Not assessed Tub/Shower Transfer: Not assessed ADL Comments: unable to assess transfer abilities due to heightened pain, very limited tolerance sitting EOB due to pain  Therapy/Group: Individual Therapy  Omayra Tulloch A Landers Prajapati 09/06/2021, 3:56 PM

## 2021-09-05 NOTE — Plan of Care (Signed)
  Problem: Consults Goal: RH SPINAL CORD INJURY PATIENT EDUCATION Description:  See Patient Education module for education specifics.  Outcome: Progressing Goal: Skin Care Protocol Initiated - if Braden Score 18 or less Description: If consults are not indicated, leave blank or document N/A Outcome: Progressing Goal: Diabetes Guidelines if Diabetic/Glucose > 140 Description: If diabetic or lab glucose is > 140 mg/dl - Initiate Diabetes/Hyperglycemia Guidelines & Document Interventions  Outcome: Progressing   Problem: SCI BOWEL ELIMINATION Goal: RH STG MANAGE BOWEL WITH ASSISTANCE Description: STG Manage Bowel with min Assistance. Outcome: Progressing Goal: RH STG SCI MANAGE BOWEL WITH MEDICATION WITH ASSISTANCE Description: STG SCI Manage bowel with medication with min assistance. Outcome: Progressing Goal: RH STG SCI MANAGE BOWEL PROGRAM W/ASSIST OR AS APPROPRIATE Description: STG SCI Manage bowel program with min assist or as appropriate. Outcome: Progressing   Problem: SCI BLADDER ELIMINATION Goal: RH STG MANAGE BLADDER WITH ASSISTANCE Description: STG Manage Bladder With min Assistance Outcome: Progressing Goal: RH STG MANAGE BLADDER WITH MEDICATION WITH ASSISTANCE Description: STG Manage Bladder With Medication With min Assistance. Outcome: Progressing Goal: RH STG MANAGE BLADDER WITH EQUIPMENT WITH ASSISTANCE Description: STG Manage Bladder With Equipment With min Assistance Outcome: Progressing   Problem: RH SKIN INTEGRITY Goal: RH STG SKIN FREE OF INFECTION/BREAKDOWN Description: Skin to remain free from additional breakdown with min assist. Outcome: Progressing Goal: RH STG MAINTAIN SKIN INTEGRITY WITH ASSISTANCE Description: STG Maintain Skin Integrity With min Assistance. Outcome: Progressing   Problem: RH SAFETY Goal: RH STG ADHERE TO SAFETY PRECAUTIONS W/ASSISTANCE/DEVICE Description: STG Adhere to Safety Precautions With cues and reminders. Outcome:  Progressing   Problem: RH PAIN MANAGEMENT Goal: RH STG PAIN MANAGED AT OR BELOW PT'S PAIN GOAL Description: < 2 on a 0-10 pain scale. Outcome: Progressing   Problem: RH KNOWLEDGE DEFICIT SCI Goal: RH STG INCREASE KNOWLEDGE OF SELF CARE AFTER SCI Description: Patient will demonstrate knowledge of medication/pain management, bowel/bladder program, and weight  bearing precautions with educational materials and handouts provided by staff independently at discharge. Outcome: Progressing   Problem: RH BOWEL ELIMINATION Goal: RH STG MANAGE BOWEL W/MEDICATION W/ASSISTANCE Description: STG Manage Bowel with Medication with Assistance. Outcome: Progressing   Problem: RH BLADDER ELIMINATION Goal: RH STG MANAGE BLADDER WITH MEDICATION WITH ASSISTANCE Description: STG Manage Bladder With Medication With Assistance. Outcome: Progressing   Problem: RH SKIN INTEGRITY Goal: RH STG SKIN FREE OF INFECTION/BREAKDOWN Outcome: Progressing Goal: RH STG MAINTAIN SKIN INTEGRITY WITH ASSISTANCE Description: STG Maintain Skin Integrity With Assistance. Outcome: Progressing Goal: RH STG ABLE TO PERFORM INCISION/WOUND CARE W/ASSISTANCE Description: STG Able To Perform Incision/Wound Care With Assistance. Outcome: Progressing   Problem: RH SAFETY Goal: RH STG DECREASED RISK OF FALL WITH ASSISTANCE Description: STG Decreased Risk of Fall With Assistance. Outcome: Progressing   Problem: RH PAIN MANAGEMENT Goal: RH STG PAIN MANAGED AT OR BELOW PT'S PAIN GOAL Outcome: Progressing   Problem: RH KNOWLEDGE DEFICIT GENERAL Goal: RH STG INCREASE KNOWLEDGE OF SELF CARE AFTER HOSPITALIZATION Outcome: Progressing

## 2021-09-05 NOTE — Progress Notes (Deleted)
Patient bowel program started at 1815. Patient had no present bowel in chamber. Suppository introduced at 0630. Sanda Linger, LPN

## 2021-09-05 NOTE — Progress Notes (Signed)
PROGRESS NOTE   Subjective/Complaints:  Has been refusing a lot of caths- cathed for 1475 cc- this AM.    Sweats after eating- is sweating.  Shivering/cold-  Hydrotherapy went well- tolerable.   Hypoglycemic this AM- 67.  Doesn't feel great.     ROS:   Pt denies SOB, abd pain, CP, N/V/C/D, and vision changes    Objective:   No results found. Recent Labs    09/02/21 1620 09/03/21 0404  WBC 6.2 6.4  HGB 8.2* 8.1*  HCT 25.7* 25.8*  PLT 278 300   Recent Labs    09/02/21 1620 09/03/21 0404  NA 133* 134*  K 3.6 3.6  CL 105 105  CO2 21* 23  GLUCOSE 217* 103*  BUN 28* 24*  CREATININE 1.31* 1.33*  CALCIUM 7.9* 8.1*    Intake/Output Summary (Last 24 hours) at 09/05/2021 1000 Last data filed at 09/05/2021 0645 Gross per 24 hour  Intake 453 ml  Output 3325 ml  Net -2872 ml     Pressure Injury 08/10/21 Coccyx Medial Stage 2 -  Partial thickness loss of dermis presenting as a shallow open injury with a red, pink wound bed without slough. (Active)  08/10/21 1700  Location: Coccyx (gluteal crease)  Location Orientation: Medial  Staging: Stage 2 -  Partial thickness loss of dermis presenting as a shallow open injury with a red, pink wound bed without slough.  Wound Description (Comments):   Present on Admission: Yes     Pressure Injury 08/14/21 Buttocks Bilateral Deep Tissue Pressure Injury - Purple or maroon localized area of discolored intact skin or blood-filled blister due to damage of underlying soft tissue from pressure and/or shear. DTI (Active)  08/14/21 0800  Location: Buttocks  Location Orientation: Bilateral  Staging: Deep Tissue Pressure Injury - Purple or maroon localized area of discolored intact skin or blood-filled blister due to damage of underlying soft tissue from pressure and/or shear.  Wound Description (Comments): DTI  Present on Admission:     Physical Exam: Vital Signs Blood  pressure (!) 156/78, pulse 73, temperature 97.7 F (36.5 C), temperature source Oral, resp. rate 18, height 5\' 11"  (1.803 m), weight 95 kg, SpO2 96 %.       General: awake, alert, appropriate, sweating on face this AM; wearing O2; NAD HENT: conjugate gaze; oropharynx moist CV: regular rate; no JVD Pulmonary: decreased at bases; but good air movement B/L  GI: soft, NT, ND, (+)BS Psychiatric: appropriate Neurological: Ox3  Skin- skin open/raw around unstageable pressure ulcer motor strength is 5/5 in bilateral deltoid, bicep, tricep, grip, 4-/5 hip flexor, knee extensors, 3/5 ankle dorsiflexor and plantar flexor Sensory exam reduced LT and Proprioception bilateral feet MS: no cogwheeling when back to examine- just pain with ROM   Assessment/Plan: 1. Functional deficits which require 3+ hours per day of interdisciplinary therapy in a comprehensive inpatient rehab setting. Physiatrist is providing close team supervision and 24 hour management of active medical problems listed below. Physiatrist and rehab team continue to assess barriers to discharge/monitor patient progress toward functional and medical goals  Care Tool:  Bathing    Body parts bathed by patient: Right arm, Left arm, Chest, Abdomen, Front  perineal area, Buttocks, Face   Body parts bathed by helper: Right upper leg, Left upper leg, Right lower leg, Left lower leg     Bathing assist Assist Level: Maximal Assistance - Patient 24 - 49%     Upper Body Dressing/Undressing Upper body dressing   What is the patient wearing?: Pull over shirt    Upper body assist Assist Level: Total Assistance - Patient < 25%    Lower Body Dressing/Undressing Lower body dressing      What is the patient wearing?: Pants, Underwear/pull up     Lower body assist Assist for lower body dressing: Maximal Assistance - Patient 25 - 49%     Toileting Toileting Toileting Activity did not occur (Clothing management and hygiene only):  N/A (no void or bm) (pt had to be cathed)  Toileting assist Assist for toileting: 2 Helpers     Transfers Chair/bed transfer  Transfers assist  Chair/bed transfer activity did not occur: Safety/medical concerns  Chair/bed transfer assist level: 2 Helpers     Locomotion Ambulation   Ambulation assist   Ambulation activity did not occur: Safety/medical concerns          Walk 10 feet activity   Assist  Walk 10 feet activity did not occur: Safety/medical concerns        Walk 50 feet activity   Assist Walk 50 feet with 2 turns activity did not occur: Safety/medical concerns         Walk 150 feet activity   Assist Walk 150 feet activity did not occur: Safety/medical concerns         Walk 10 feet on uneven surface  activity   Assist Walk 10 feet on uneven surfaces activity did not occur: Safety/medical concerns         Wheelchair     Assist Is the patient using a wheelchair?: Yes Type of Wheelchair: Manual Wheelchair activity did not occur: Safety/medical concerns         Wheelchair 50 feet with 2 turns activity    Assist    Wheelchair 50 feet with 2 turns activity did not occur: Safety/medical concerns       Wheelchair 150 feet activity     Assist  Wheelchair 150 feet activity did not occur: Safety/medical concerns       Blood pressure (!) 156/78, pulse 73, temperature 97.7 F (36.5 C), temperature source Oral, resp. rate 18, height 5\' 11"  (1.803 m), weight 95 kg, SpO2 96 %.    Medical Problem List and Plan: 1.  Decline in self care and mobility  secondary to thoracic myelopathy/thoracic osteomyelitis.              -patient may  shower             -ELOS/Goals: 10-12d  Con't PT and OT_ dteam conference today- d/c 3 weeks or so since pain so limiting.    11/12-con't PT and OT 2.  Antithrombotics: -DVT/anticoagulation:  N/A             -antiplatelet therapy: none 3. Pain Management: Oxycodone prn. 11/8- will  increase oxy to q4 hours prn- might need long acting?   11/9- will add MS Contin 15 mg BID for pain- and monitor  11/11- pain much better with seating/new low air loss mattress- con't regimen 4. Mood: LCSW to follow for evaluation and support.              -antipsychotic agents: N/A 5. Neuropsych: This patient is intermittently capable  of making decisions on his own behalf. 6. Skin/Wound Care: Air mattress for pressure relief measures.  7. Fluids/Electrolytes/Nutrition: Monitor I/O. Check lytes in am.  8. MSSA bacteremia: On ancef with end date 12/01 9. UGIB/melena: Has received 11 units PRBC during admission --Continue protonix BID w/ carafate ac/hs.  --Monitor for signs of bleeding.  11/9- will recheck labs today at 3pm- to make sure not dropping more since is s/p so many units of pRBCs.   11/10- Hb back up to 8.1 this AM- will con'tto monitor 10. T2DM: Po intake poor --continue TF at nights. Continue insulin gargline 12 units at nights --monitor BS every 4 hours with SSI 11/7- BG's look great- still poor appetite, but I think thrush is making it worse- things "taste weird". Con't to monitor and regimen 11/10- BG's 88-184- labile, but usually better cotnrolled- con't regimen 11/12- BG 67 this AM; will reduce Semglee to 10 units- will monitor 11. HTN: Monitor BP TID--continue Norvasc and metoprolol for control.   11/7- BP somewhat elevated in 106Y-  systolic- will monitor for trend.   11/11- BP was doing better, but back up to 150s SBP today- con't to monitor 12. ABLA: On aranesp weekly for supplement.  11/7- Hb stable at 8.4- con't to monitor  11/9- Hb today is 7.6- down from 8.4- will recheck in AM to verify- hemodilution?   11/10- back up to 8.1- will monitor 13. Acute renal insufficiency- 11/7- improving- Cr down to 1.37 and BUN down to 37-   11/9- will recheck in AM- is stable last time   11/10- Cr 1.33- BUN 24 14. O2 need/acute resp failure  11/7- will wean O2 as able 15.  Persistent pulmonary edema  11/9- will give Lasix 40 mg daily x 3 days and KCL 40 mEq x3 days as well since K+ is borderline low.   11/11- will xray again Monday and wean O2 as tolerated 16. Dispo  11/7- wife left note asking for "CNS w/u and endo work up as well as some more esoteric dx's"- will need to discuss with her before order any work up.    16. Urinary retention- 11/8- will start Flomax- foley out- in/out caths- will do allergy test to see if use lidocaine jelly- if so, order.   #18 17. Pressure ulcer on sacrum/coccyx- unstageable  11/10- will call gen surgery since so painful -see if can be opened up and drained?  11/11- ordered [facility]_ they are starting hydrotherapy M/W/F and wound care by nursing with santyl and saline gauze and foam dressing.   11/12- hydrotherapy went well- con't regimen 18. Urinary retention  11/11- on Flomax 0.8 mg nightly- peeing some, but still needing caths- will change to q4 hours due to volumes  11/12- Tried to not cath- but was cathed for 1500cc last night- educated that he needs to be cathed for volumes >300cc.     LOS: 6 days A FACE TO FACE EVALUATION WAS PERFORMED  Jade Burright 09/05/2021, 10:00 AM

## 2021-09-06 DIAGNOSIS — N319 Neuromuscular dysfunction of bladder, unspecified: Secondary | ICD-10-CM

## 2021-09-06 LAB — GLUCOSE, CAPILLARY
Glucose-Capillary: 95 mg/dL (ref 70–99)
Glucose-Capillary: 172 mg/dL — ABNORMAL HIGH (ref 70–99)
Glucose-Capillary: 148 mg/dL — ABNORMAL HIGH (ref 70–99)
Glucose-Capillary: 139 mg/dL — ABNORMAL HIGH (ref 70–99)

## 2021-09-06 MED ORDER — BETHANECHOL CHLORIDE 10 MG PO TABS
10.0000 mg | ORAL_TABLET | Freq: Three times a day (TID) | ORAL | Status: DC
  Administered 2021-09-06 – 2021-09-21 (×45): 10 mg via ORAL
  Filled 2021-09-06 (×46): qty 1

## 2021-09-06 MED ORDER — TAMSULOSIN HCL 0.4 MG PO CAPS
0.8000 mg | ORAL_CAPSULE | Freq: Every day | ORAL | Status: DC
  Administered 2021-09-06 – 2021-09-20 (×15): 0.8 mg via ORAL
  Filled 2021-09-06 (×15): qty 2

## 2021-09-06 NOTE — Progress Notes (Signed)
PROGRESS NOTE   Subjective/Complaints: Pt happy that he voided this morning. Was discouraged that he couldn't 2 attempts prior. Wants to continue voiding trial. After I saw patient nurse came to me and said that patient had been requesting to replace foley d/t discomfort with I/O caths. (That is not what patient indicated to me although he reported discomfort with I/o caths despite lidocaine)   Pt denies SOB, abd pain, CP, N/V/C/D, and vision changes    Objective:   No results found. No results for input(s): WBC, HGB, HCT, PLT in the last 72 hours.  No results for input(s): NA, K, CL, CO2, GLUCOSE, BUN, CREATININE, CALCIUM in the last 72 hours.   Intake/Output Summary (Last 24 hours) at 09/06/2021 0752 Last data filed at 09/06/2021 0445 Gross per 24 hour  Intake 1330 ml  Output 1968 ml  Net -638 ml     Pressure Injury 08/10/21 Coccyx Medial Stage 2 -  Partial thickness loss of dermis presenting as a shallow open injury with a red, pink wound bed without slough. (Active)  08/10/21 1700  Location: Coccyx (gluteal crease)  Location Orientation: Medial  Staging: Stage 2 -  Partial thickness loss of dermis presenting as a shallow open injury with a red, pink wound bed without slough.  Wound Description (Comments):   Present on Admission: Yes     Pressure Injury 08/14/21 Buttocks Bilateral Deep Tissue Pressure Injury - Purple or maroon localized area of discolored intact skin or blood-filled blister due to damage of underlying soft tissue from pressure and/or shear. DTI (Active)  08/14/21 0800  Location: Buttocks  Location Orientation: Bilateral  Staging: Deep Tissue Pressure Injury - Purple or maroon localized area of discolored intact skin or blood-filled blister due to damage of underlying soft tissue from pressure and/or shear.  Wound Description (Comments): DTI  Present on Admission:     Physical Exam: Vital  Signs Blood pressure (!) 154/76, pulse 79, temperature 98.8 F (37.1 C), resp. rate 18, height 5\' 11"  (1.803 m), weight 95 kg, SpO2 94 %.       Constitutional: No distress . Vital signs reviewed. HEENT: NCAT, EOMI, oral membranes moist Neck: supple Cardiovascular: RRR without murmur. No JVD    Respiratory/Chest: CTA Bilaterally without wheezes or rales. Normal effort    GI/Abdomen: BS +, non-tender, non-distended Ext: no clubbing, cyanosis, or edema Psych: pleasant and cooperative  Skin- skin open/raw around unstageable pressure ulcer Neuro. Alert and oriented x 3. Normal insight and awareness. Intact Memory. Normal language and speech. Cranial nerve exam unremarkable motor strength is 5/5 in bilateral deltoid, bicep, tricep, grip, 4-/5 hip flexor, knee extensors, 3/5 ankle dorsiflexor and plantar flexor Sensory exam reduced LT and Proprioception bilateral feet MS: no cogwheeling when back to examine- just pain with ROM   Assessment/Plan: 1. Functional deficits which require 3+ hours per day of interdisciplinary therapy in a comprehensive inpatient rehab setting. Physiatrist is providing close team supervision and 24 hour management of active medical problems listed below. Physiatrist and rehab team continue to assess barriers to discharge/monitor patient progress toward functional and medical goals  Care Tool:  Bathing    Body parts bathed by patient: Right arm,  Left arm, Chest, Abdomen, Front perineal area, Buttocks, Face   Body parts bathed by helper: Right upper leg, Left upper leg, Right lower leg, Left lower leg     Bathing assist Assist Level: Maximal Assistance - Patient 24 - 49%     Upper Body Dressing/Undressing Upper body dressing   What is the patient wearing?: Pull over shirt    Upper body assist Assist Level: Total Assistance - Patient < 25%    Lower Body Dressing/Undressing Lower body dressing      What is the patient wearing?: Pants, Underwear/pull  up     Lower body assist Assist for lower body dressing: Maximal Assistance - Patient 25 - 49%     Toileting Toileting Toileting Activity did not occur (Clothing management and hygiene only): N/A (no void or bm) (pt had to be cathed)  Toileting assist Assist for toileting: 2 Helpers     Transfers Chair/bed transfer  Transfers assist  Chair/bed transfer activity did not occur: Safety/medical concerns  Chair/bed transfer assist level: 2 Helpers     Locomotion Ambulation   Ambulation assist   Ambulation activity did not occur: Safety/medical concerns          Walk 10 feet activity   Assist  Walk 10 feet activity did not occur: Safety/medical concerns        Walk 50 feet activity   Assist Walk 50 feet with 2 turns activity did not occur: Safety/medical concerns         Walk 150 feet activity   Assist Walk 150 feet activity did not occur: Safety/medical concerns         Walk 10 feet on uneven surface  activity   Assist Walk 10 feet on uneven surfaces activity did not occur: Safety/medical concerns         Wheelchair     Assist Is the patient using a wheelchair?: Yes Type of Wheelchair: Manual Wheelchair activity did not occur: Safety/medical concerns         Wheelchair 50 feet with 2 turns activity    Assist    Wheelchair 50 feet with 2 turns activity did not occur: Safety/medical concerns       Wheelchair 150 feet activity     Assist  Wheelchair 150 feet activity did not occur: Safety/medical concerns       Blood pressure (!) 154/76, pulse 79, temperature 98.8 F (37.1 C), resp. rate 18, height 5\' 11"  (1.803 m), weight 95 kg, SpO2 94 %.    Medical Problem List and Plan: 1.  Decline in self care and mobility  secondary to thoracic myelopathy/thoracic osteomyelitis.              -patient may  shower             -ELOS/Goals: 10-12d  Con't PT and OT_ dteam conference today- d/c 3 weeks or so since pain so  limiting.   -Continue CIR therapies including PT, OT  2.  Antithrombotics: -DVT/anticoagulation:  N/A             -antiplatelet therapy: none 3. Pain Management: Oxycodone prn. 11/8- will increase oxy to q4 hours prn- might need long acting?   11/9- will add MS Contin 15 mg BID for pain- and monitor  11/13- pain much better with seating/new low air loss mattress-  stable 4. Mood: LCSW to follow for evaluation and support.              -antipsychotic agents: N/A 5. Neuropsych: This  patient is intermittently capable of making decisions on his own behalf. 6. Skin/Wound Care: Air mattress for pressure relief measures.  7. Fluids/Electrolytes/Nutrition: Monitor I/O. Check lytes in am.  8. MSSA bacteremia: On ancef with end date 12/01 9. UGIB/melena: Has received 11 units PRBC during admission --Continue protonix BID w/ carafate ac/hs.  --Monitor for signs of bleeding.  11/9- will recheck labs today at 3pm- to make sure not dropping more since is s/p so many units of pRBCs.   11/10- Hb back up to 8.1 this AM- will con'tto monitor 10. T2DM: Po intake poor --continue TF at nights. Continue insulin gargline 12 units at nights --monitor BS every 4 hours with SSI 11/7- BG's look great- still poor appetite, but I think thrush is making it worse- things "taste weird". Con't to monitor and regimen 11/10- BG's 88-184- labile, but usually better cotnrolled- con't regimen 11/13 semglee reduced to 10u yesterday--cbg 95 this morning. Observe today 11. HTN: Monitor BP TID--continue Norvasc and metoprolol for control.   11/13 borderline control  systolic- no changes    12. ABLA: On aranesp weekly for supplement.  11/7- Hb stable at 8.4- con't to monitor  11/9- Hb today is 7.6- down from 8.4- will recheck in AM to verify- hemodilution?   11/10- back up to 8.1- will monitor 13. Acute renal insufficiency- 11/7- improving- Cr down to 1.37 and BUN down to 37-   11/9- will recheck in AM- is stable last time    11/10- Cr 1.33- BUN 24 14. O2 need/acute resp failure  11/7- will wean O2 as able 15. Persistent pulmonary edema  11/9- will give Lasix 40 mg daily x 3 days and KCL 40 mEq x3 days as well since K+ is borderline low.   11/11- will xray again Monday and wean O2 as tolerated 16. Dispo  11/7- wife left note asking for "CNS w/u and endo work up as well as some more esoteric dx's"- will need to discuss with her before order any work up.    16. Urinary retention- 11/8- will start Flomax- foley out- in/out caths- will do allergy test to see if use lidocaine jelly- if so, order.   #18 17. Pressure ulcer on sacrum/coccyx- unstageable  11/10- will call gen surgery since so painful -see if can be opened up and drained?  11/11- ordered [facility]_ they are starting hydrotherapy M/W/F and wound care by nursing with santyl and saline gauze and foam dressing.   11/12- hydrotherapy went well- con't regimen 18. Urinary retention  11/11- on Flomax 0.8 mg nightly- peeing some, but still needing caths- will change to q4 hours due to volumes  11/12- Tried to not cath- but was cathed for 1500cc last night- educated that he needs to be cathed for volumes >300cc.   11/13- pt told he he did NOT want foley   -increase flomax to 0.8mg  qpm (only on 0.4)   -begin urecholine trial 10mg  tid   -OOB to empty when possible   -lidocaine gel with caths   LOS: 7 days A FACE TO FACE EVALUATION WAS PERFORMED  Meredith Staggers 09/06/2021, 7:52 AM

## 2021-09-06 NOTE — Plan of Care (Signed)
  Problem: Consults Goal: RH SPINAL CORD INJURY PATIENT EDUCATION Description:  See Patient Education module for education specifics.  Outcome: Progressing Goal: Skin Care Protocol Initiated - if Braden Score 18 or less Description: If consults are not indicated, leave blank or document N/A Outcome: Progressing Goal: Diabetes Guidelines if Diabetic/Glucose > 140 Description: If diabetic or lab glucose is > 140 mg/dl - Initiate Diabetes/Hyperglycemia Guidelines & Document Interventions  Outcome: Progressing   Problem: SCI BOWEL ELIMINATION Goal: RH STG MANAGE BOWEL WITH ASSISTANCE Description: STG Manage Bowel with min Assistance. Outcome: Progressing Goal: RH STG SCI MANAGE BOWEL WITH MEDICATION WITH ASSISTANCE Description: STG SCI Manage bowel with medication with min assistance. Outcome: Progressing Goal: RH STG SCI MANAGE BOWEL PROGRAM W/ASSIST OR AS APPROPRIATE Description: STG SCI Manage bowel program with min assist or as appropriate. Outcome: Progressing   Problem: SCI BLADDER ELIMINATION Goal: RH STG MANAGE BLADDER WITH ASSISTANCE Description: STG Manage Bladder With min Assistance Outcome: Progressing Goal: RH STG MANAGE BLADDER WITH MEDICATION WITH ASSISTANCE Description: STG Manage Bladder With Medication With min Assistance. Outcome: Progressing Goal: RH STG MANAGE BLADDER WITH EQUIPMENT WITH ASSISTANCE Description: STG Manage Bladder With Equipment With min Assistance Outcome: Progressing   Problem: RH SKIN INTEGRITY Goal: RH STG SKIN FREE OF INFECTION/BREAKDOWN Description: Skin to remain free from additional breakdown with min assist. Outcome: Progressing Goal: RH STG MAINTAIN SKIN INTEGRITY WITH ASSISTANCE Description: STG Maintain Skin Integrity With min Assistance. Outcome: Progressing   Problem: RH SAFETY Goal: RH STG ADHERE TO SAFETY PRECAUTIONS W/ASSISTANCE/DEVICE Description: STG Adhere to Safety Precautions With cues and reminders. Outcome:  Progressing   Problem: RH PAIN MANAGEMENT Goal: RH STG PAIN MANAGED AT OR BELOW PT'S PAIN GOAL Description: < 2 on a 0-10 pain scale. Outcome: Progressing   Problem: RH KNOWLEDGE DEFICIT SCI Goal: RH STG INCREASE KNOWLEDGE OF SELF CARE AFTER SCI Description: Patient will demonstrate knowledge of medication/pain management, bowel/bladder program, and weight  bearing precautions with educational materials and handouts provided by staff independently at discharge. Outcome: Progressing   Problem: RH BOWEL ELIMINATION Goal: RH STG MANAGE BOWEL W/MEDICATION W/ASSISTANCE Description: STG Manage Bowel with Medication with Assistance. Outcome: Progressing   Problem: RH BLADDER ELIMINATION Goal: RH STG MANAGE BLADDER WITH MEDICATION WITH ASSISTANCE Description: STG Manage Bladder With Medication With Assistance. Outcome: Progressing   Problem: RH SKIN INTEGRITY Goal: RH STG SKIN FREE OF INFECTION/BREAKDOWN Outcome: Progressing Goal: RH STG MAINTAIN SKIN INTEGRITY WITH ASSISTANCE Description: STG Maintain Skin Integrity With Assistance. Outcome: Progressing Goal: RH STG ABLE TO PERFORM INCISION/WOUND CARE W/ASSISTANCE Description: STG Able To Perform Incision/Wound Care With Assistance. Outcome: Progressing   Problem: RH SAFETY Goal: RH STG DECREASED RISK OF FALL WITH ASSISTANCE Description: STG Decreased Risk of Fall With Assistance. Outcome: Progressing   Problem: RH PAIN MANAGEMENT Goal: RH STG PAIN MANAGED AT OR BELOW PT'S PAIN GOAL Outcome: Progressing   Problem: RH KNOWLEDGE DEFICIT GENERAL Goal: RH STG INCREASE KNOWLEDGE OF SELF CARE AFTER HOSPITALIZATION Outcome: Progressing

## 2021-09-07 ENCOUNTER — Inpatient Hospital Stay (HOSPITAL_COMMUNITY): Payer: 59

## 2021-09-07 LAB — BASIC METABOLIC PANEL
Anion gap: 6 (ref 5–15)
BUN: 16 mg/dL (ref 6–20)
CO2: 26 mmol/L (ref 22–32)
Calcium: 8.5 mg/dL — ABNORMAL LOW (ref 8.9–10.3)
Chloride: 102 mmol/L (ref 98–111)
Creatinine, Ser: 1.23 mg/dL (ref 0.61–1.24)
GFR, Estimated: >60 mL/min (ref >60)
Glucose, Bld: 117 mg/dL — ABNORMAL HIGH (ref 70–99)
Potassium: 4.1 mmol/L (ref 3.5–5.1)
Sodium: 134 mmol/L — ABNORMAL LOW (ref 135–145)

## 2021-09-07 LAB — GLUCOSE, CAPILLARY
Glucose-Capillary: 105 mg/dL — ABNORMAL HIGH (ref 70–99)
Glucose-Capillary: 234 mg/dL — ABNORMAL HIGH (ref 70–99)
Glucose-Capillary: 114 mg/dL — ABNORMAL HIGH (ref 70–99)
Glucose-Capillary: 140 mg/dL — ABNORMAL HIGH (ref 70–99)

## 2021-09-07 IMAGING — DX DG CHEST 1V PORT
1 series · 1 of 1 positions shown · non-contrast
Comparison: Previous studies including the examination of
[DATE]

CLINICAL DATA: Shortness of breath

EXAM:
PORTABLE CHEST 1 VIEW

[chest ap]
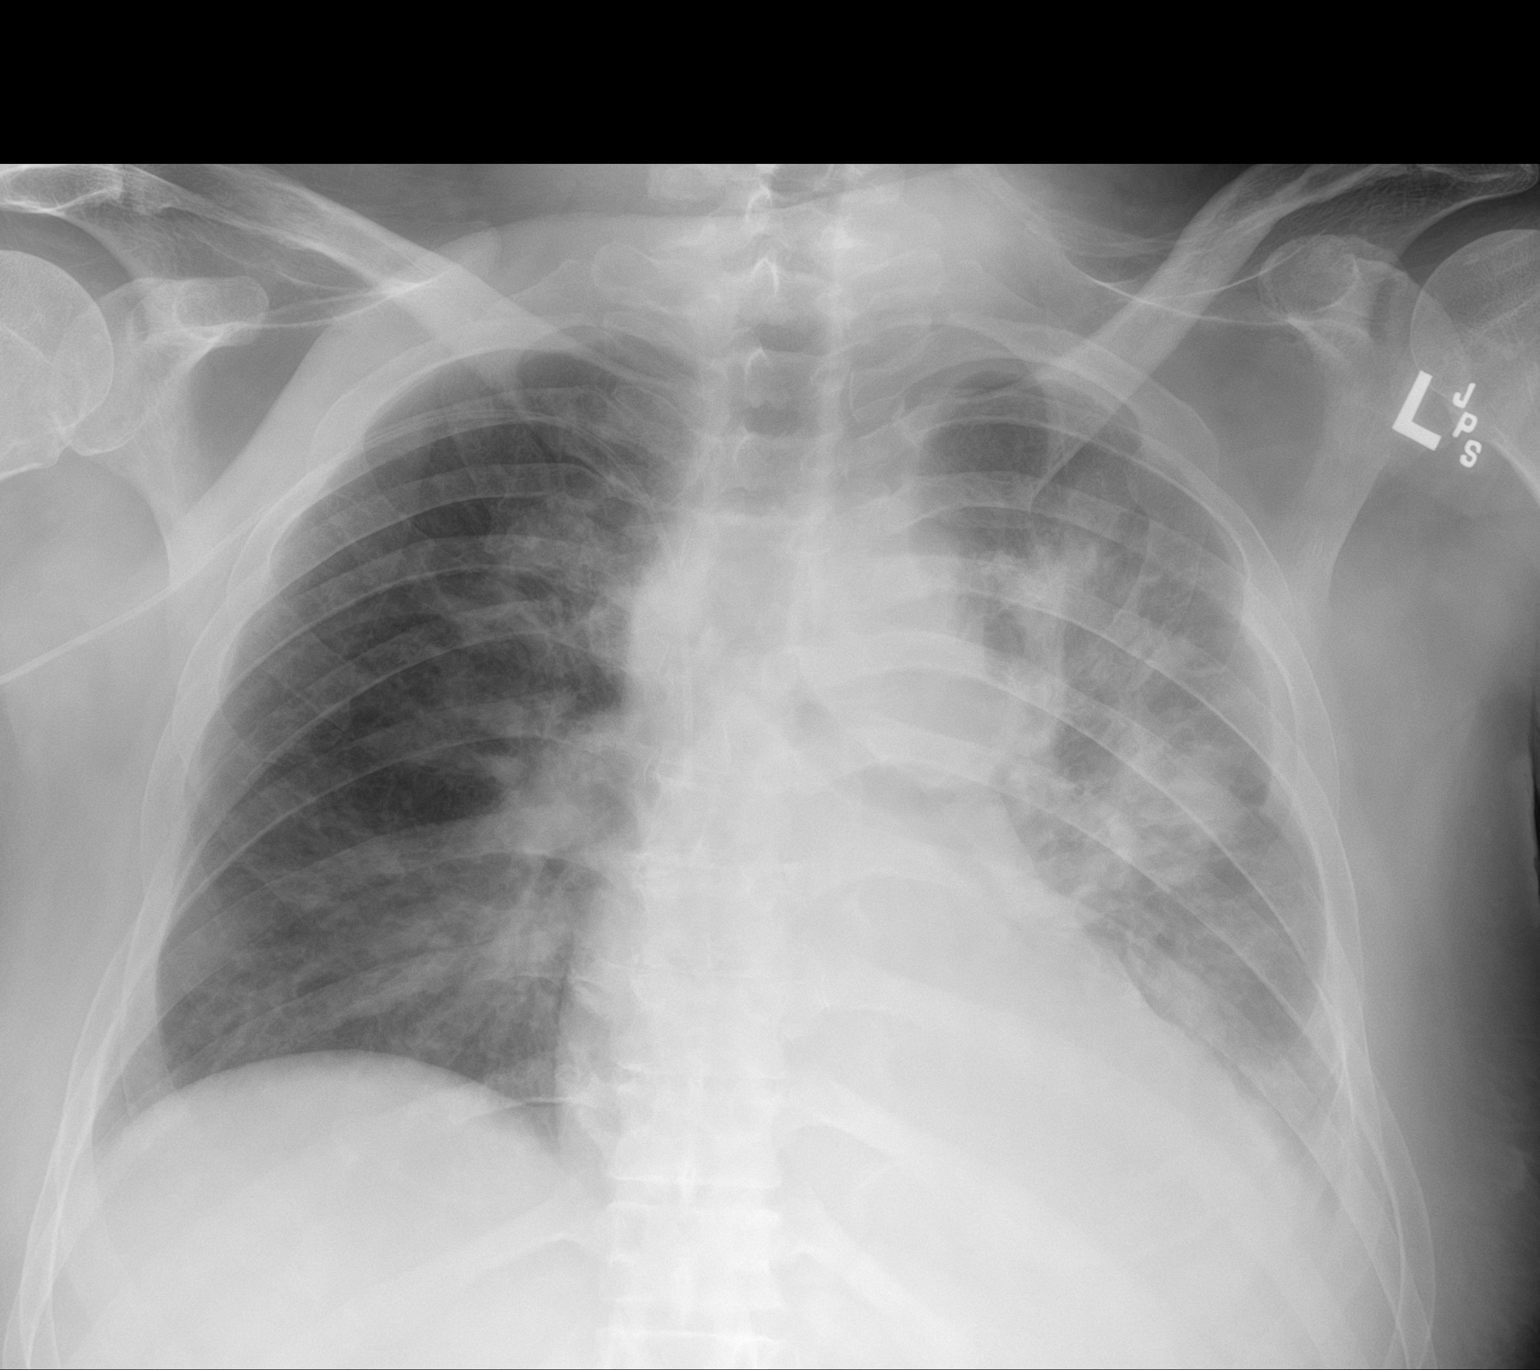

[1 of 1 positions shown; findings below may reference images not displayed]

FINDINGS: Transverse diameter of heart is increased. Tip of PICC line is seen
in the superior vena cava. There is interval increase in infiltrates
in the left parahilar region. Central pulmonary vessels are
prominent. Increased interstitial and alveolar densities seen in the
right parahilar region and right lower lung fields. There is
homogeneous opacity in the medial left lower lung field obscuring
the left hemidiaphragm. There is blunting of left lateral CP angle.
There is no pneumothorax.
IMPRESSION: There is interval worsening of infiltrates in the left parahilar
region. There is interval increase in interstitial and alveolar
markings in the right parahilar region. Findings suggest worsening
of pulmonary edema or worsening of bilateral pneumonia. Homogeneous
opacity in the medial left lower lung fields may suggest
atelectasis/pneumonia in the left lower lobe. Small left pleural
effusion is seen.

## 2021-09-07 MED ORDER — COLLAGENASE 250 UNIT/GM EX OINT
TOPICAL_OINTMENT | Freq: Every day | CUTANEOUS | Status: DC
  Filled 2021-09-07: qty 30

## 2021-09-07 MED ORDER — DAKINS (1/4 STRENGTH) 0.125 % EX SOLN
Freq: Two times a day (BID) | CUTANEOUS | Status: AC
  Filled 2021-09-07: qty 473

## 2021-09-07 MED ORDER — SORBITOL 70 % SOLN
30.0000 mL | Freq: Once | Status: AC
  Administered 2021-09-07: 30 mL via ORAL
  Filled 2021-09-07: qty 30

## 2021-09-07 MED ORDER — POLYETHYLENE GLYCOL 3350 17 G PO PACK
17.0000 g | PACK | Freq: Every day | ORAL | Status: DC
  Administered 2021-09-08 – 2021-09-16 (×6): 17 g via ORAL
  Filled 2021-09-07 (×11): qty 1

## 2021-09-07 MED ORDER — OXYCODONE-ACETAMINOPHEN 5-325 MG PO TABS
1.0000 | ORAL_TABLET | ORAL | Status: DC | PRN
  Administered 2021-09-07 – 2021-09-21 (×27): 2 via ORAL
  Filled 2021-09-07 (×29): qty 2

## 2021-09-07 NOTE — Progress Notes (Signed)
Physical Therapy Wound Treatment Patient Details  Name: Jose [name] MRN: [id number] Date of Birth: 22-Feb-1976  Today's Date: 09/07/2021 PT Individual Time: 1130-1202 PT Individual Time Calculation (min): 32 min   Subjective  Subjective: It's all in my back (pain) not really the wound so much. Patient and Family Stated Goals: get the wound healed and do well with therapies. Date of Onset:  (unknown) Prior Treatments:  (unknown)  Pain Score: 4-6/10 at back and lesser to the wound   Wound Assessment  Pressure Injury 08/10/21 Coccyx Medial Stage 2 -  Partial thickness loss of dermis presenting as a shallow open injury with a red, pink wound bed without slough. (Active)  Wound Image    09/04/21 1318  Dressing Type Barrier Film (skin prep) 09/07/21 1208  Dressing Clean;Dry;Intact 09/07/21 1208  Dressing Change Frequency Twice a day 09/07/21 1208  State of Healing Eschar 09/07/21 1208  Site / Wound Assessment Yellow;Bleeding 09/07/21 1055  % Wound base Red or Granulating 5% 09/07/21 1208  % Wound base Yellow/Fibrinous Exudate 95% 09/07/21 1208  % Wound base Black/Eschar 0% 09/07/21 1208  % Wound base Other/Granulation Tissue (Comment) 0% 09/07/21 1208  Peri-wound Assessment Erythema (blanchable) 09/07/21 1208  Wound Length (cm) 6 cm 09/04/21 1318  Wound Width (cm) 3.5 cm 09/04/21 1318  Wound Depth (cm) 1 cm 09/04/21 1318  Wound Surface Area (cm^2) 21 cm^2 09/04/21 1318  Wound Volume (cm^3) 21 cm^3 09/04/21 1318  Margins Unattached edges (unapproximated) 09/07/21 1208  Drainage Amount Moderate 09/07/21 1208  Drainage Description Green;No odor;Serous 09/07/21 1208  Treatment Cleansed;Debridement (Selective);Hydrotherapy (Pulse lavage);Packing (Saline gauze) 09/07/21 1208      Hydrotherapy Pulsed lavage therapy - wound location: sacral/coccyx Pulsed Lavage with Suction (psi): 12 psi Pulsed Lavage with Suction - Normal Saline Used: 1000 mL Pulsed Lavage Tip: Tip with splash  shield Selective Debridement Selective Debridement - Location: sacral/coccyx Selective Debridement - Tools Used: Scalpel;Forceps Selective Debridement - Tissue Removed: Thick yellow eschar covering wound base fully   Wound Assessment and Plan  Wound Therapy - Assess/Plan/Recommendations Wound Therapy - Clinical Statement: Pt can benefit from hydrotherapy for pulsed lavage to soften up the thick eschar to allow for selective debridement, Then PLS for it's benefit of decrease bacterial burdent. Wound Therapy - Functional Problem List: pain limiting bed mobility and OOB activity. Factors Delaying/Impairing Wound Healing: Diabetes Mellitus;Multiple medical problems Hydrotherapy Plan: Debridement;Dressing change;Pulsatile lavage with suction;Patient/family education Wound Therapy - Frequency: 3X / week Wound Therapy - Current Recommendations: PT Wound Therapy - Follow Up Recommendations: dressing changes by RN;dressing changes by family/patient  Wound Therapy Goals- Improve the function of patient's integumentary system by progressing the wound(s) through the phases of wound healing (inflammation - proliferation - remodeling) by: Decrease Necrotic Tissue to: 20% Decrease Necrotic Tissue - Progress: Progressing toward goal Increase Granulation Tissue to: 80% Increase Granulation Tissue - Progress: Progressing toward goal Improve Drainage Characteristics: Min;Serous Improve Drainage Characteristics - Progress: Progressing toward goal Goals/treatment plan/discharge plan were made with and agreed upon by patient/family: Yes Time For Goal Achievement: 7 days Wound Therapy - Potential for Goals: Good  Goals will be updated until maximal potential achieved or discharge criteria met.  Discharge criteria: when goals achieved, discharge from hospital, MD decision/surgical intervention, no progress towards goals, refusal/missing three consecutive treatments without notification or medical reason.  GP      Tessie Fass Colston Pyle 09/07/2021, 12:17 PM 09/07/2021  Ginger Carne., PT [facility] [phone]  (pager) [phone]  (office)

## 2021-09-07 NOTE — Progress Notes (Signed)
PROGRESS NOTE    Pt reports no actual caths x 48 hours-  Is peeing and not always emptying, but doing better.  Has a spot on L forearm from previous IV was asking about.   LBM Friday? Has been at least 3 days- needs to go.   Hydrotherapy M/W/F;  said super painful to sit in w/c- but is trying to limit pain med usage.    ROS:   Pt denies SOB, abd pain, CP, N/V/C/D, and vision changes    Objective:   No results found. No results for input(s): WBC, HGB, HCT, PLT in the last 72 hours.  Recent Labs    09/07/21 0335  NA 134*  K 4.1  CL 102  CO2 26  GLUCOSE 117*  BUN 16  CREATININE 1.23  CALCIUM 8.5*     Intake/Output Summary (Last 24 hours) at 09/07/2021 0830 Last data filed at 09/07/2021 0745 Gross per 24 hour  Intake 848 ml  Output 3197 ml  Net -2349 ml     Pressure Injury 08/10/21 Coccyx Medial Stage 2 -  Partial thickness loss of dermis presenting as a shallow open injury with a red, pink wound bed without slough. (Active)  08/10/21 1700  Location: Coccyx (gluteal crease)  Location Orientation: Medial  Staging: Stage 2 -  Partial thickness loss of dermis presenting as a shallow open injury with a red, pink wound bed without slough.  Wound Description (Comments):   Present on Admission: Yes     Pressure Injury 08/14/21 Buttocks Bilateral Deep Tissue Pressure Injury - Purple or maroon localized area of discolored intact skin or blood-filled blister due to damage of underlying soft tissue from pressure and/or shear. DTI (Active)  08/14/21 0800  Location: Buttocks  Location Orientation: Bilateral  Staging: Deep Tissue Pressure Injury - Purple or maroon localized area of discolored intact skin or blood-filled blister due to damage of underlying soft tissue from pressure and/or shear.  Wound Description (Comments): DTI  Present on Admission:     Physical Exam: Vital Signs Blood pressure (!) 156/76,  pulse 88, temperature 98.7 F (37.1 C), resp. rate 16, height 5\' 11"  (1.803 m), weight 95 kg, SpO2 100 %.       General: awake, alert, appropriate, laying supine in bed; NAD HENT: conjugate gaze; oropharynx moist CV: regular rate; no JVD Pulmonary: adequate air movement- decreased at bases B/L  GI: soft, NT, ND, (+)BS- hypoactive- protuberant Psychiatric: appropriate- slightly anxious Neurological: Ox3  Skin- skin open/raw around unstageable pressure ulcer Neuro. Alert and oriented x 3. Normal insight and awareness. Intact Memory. Normal language and speech. Cranial nerve exam unremarkable motor strength is 5/5 in bilateral deltoid, bicep, tricep, grip, 4-/5 hip flexor, knee extensors, 3/5 ankle dorsiflexor and plantar flexor Sensory exam reduced LT and Proprioception bilateral feet MS: no cogwheeling when back to examine- just pain with ROM   Assessment/Plan: 1. Functional deficits which require 3+ hours per day of interdisciplinary therapy in a comprehensive inpatient rehab setting. Physiatrist is providing close team supervision and 24 hour management of active medical problems listed below. Physiatrist and rehab team continue to assess barriers to discharge/monitor patient progress toward functional and medical goals  Care Tool:  Bathing    Body parts bathed by patient: Right arm, Left arm, Chest, Abdomen, Front perineal area, Buttocks, Face   Body parts bathed by helper: Right upper leg, Left upper leg, Right lower leg, Left lower leg     Bathing assist Assist Level: Maximal Assistance - Patient 24 - 49%     Upper Body Dressing/Undressing Upper body dressing   What is the patient wearing?: Pull over shirt    Upper body assist Assist Level: Total Assistance - Patient < 25%    Lower Body Dressing/Undressing Lower body dressing      What is the patient wearing?: Pants, Underwear/pull up     Lower body assist Assist for lower body dressing: Maximal Assistance -  Patient 25 - 49%     Toileting Toileting Toileting Activity did not occur (Clothing management and hygiene only): N/A (no void or bm) (pt had to be cathed)  Toileting assist Assist for toileting: 2 Helpers     Transfers Chair/bed transfer  Transfers assist  Chair/bed transfer activity did not occur: Safety/medical concerns  Chair/bed transfer assist level: 2 Helpers     Locomotion Ambulation   Ambulation assist   Ambulation activity did not occur: Safety/medical concerns          Walk 10 feet activity   Assist  Walk 10 feet activity did not occur: Safety/medical concerns        Walk 50 feet activity   Assist Walk 50 feet with 2 turns activity did not occur: Safety/medical concerns         Walk 150 feet activity   Assist Walk 150 feet activity did not occur: Safety/medical concerns         Walk 10 feet on uneven surface  activity   Assist Walk 10 feet on uneven surfaces activity did not occur: Safety/medical concerns         Wheelchair     Assist Is the patient using a wheelchair?: Yes Type of Wheelchair: Manual Wheelchair activity did not occur: Safety/medical concerns         Wheelchair 50 feet with 2 turns activity    Assist    Wheelchair 50 feet with 2 turns activity did not occur: Safety/medical concerns       Wheelchair 150 feet activity     Assist  Wheelchair 150 feet activity did not occur: Safety/medical concerns       Blood pressure (!) 156/76, pulse 88, temperature 98.7 F (37.1 C), resp. rate 16, height 5\' 11"  (1.803 m), weight 95 kg, SpO2 100 %.    Medical Problem List and Plan: 1.  Decline in self care and mobility  secondary to thoracic myelopathy/thoracic osteomyelitis.              -patient may  shower             -ELOS/Goals: 10-12d  Con't PT and OT_ dteam conference today- d/c 3 weeks or so since pain so limiting.   Con't PT and OT- pain biggest limiter 2.   Antithrombotics: -DVT/anticoagulation:  N/A             -antiplatelet therapy: none 3. Pain Management: Oxycodone prn. 11/8- will increase oxy to q4 hours prn- might need long acting?   11/9- will add MS Contin 15 mg BID for pain- and monitor  11/13- pain much better with seating/new low air loss mattress-  stable  11/14- pt reports not taking prns much- advised him to take at least  3-4x/day so can participate in therapy-he hasn't been- trying to avoid pain meds as much as possible- will make percocet 1-2 tabs q4 hours prn and con't MS Contin 4. Mood: LCSW to follow for evaluation and support.              -antipsychotic agents: N/A 5. Neuropsych: This patient is intermittently capable of making decisions on his own behalf. 6. Skin/Wound Care: Air mattress for pressure relief measures.  7. Fluids/Electrolytes/Nutrition: Monitor I/O. Check lytes in am.  8. MSSA bacteremia: On ancef with end date 12/01 9. UGIB/melena: Has received 11 units PRBC during admission --Continue protonix BID w/ carafate ac/hs.  --Monitor for signs of bleeding.  11/9- will recheck labs today at 3pm- to make sure not dropping more since is s/p so many units of pRBCs.   11/10- Hb back up to 8.1 this AM- will con'tto monitor  11/14- labs in AM 10. T2DM: Po intake poor --continue TF at nights. Continue insulin gargline 12 units at nights --monitor BS every 4 hours with SSI 11/7- BG's look great- still poor appetite, but I think thrush is making it worse- things "taste weird". Con't to monitor and regimen 11/10- BG's 88-184- labile, but usually better cotnrolled- con't regimen 11/13 semglee reduced to 10u yesterday--cbg 95 this morning. Observe today 11. HTN: Monitor BP TID--continue Norvasc and metoprolol for control.   11/13 borderline control  systolic- no changes   37/90- BP a little elevated - think it's pain- will titrate up on metoprolol 12. ABLA: On aranesp weekly for supplement.  11/7- Hb stable at 8.4- con't  to monitor  11/9- Hb today is 7.6- down from 8.4- will recheck in AM to verify- hemodilution?   11/10- back up to 8.1- will monitor 13. Acute renal insufficiency- 11/7- improving- Cr down to 1.37 and BUN down to 37-   11/9- will recheck in AM- is stable last time   11/10- Cr 1.33- BUN 24 14. O2 need/acute resp failure  11/7- will wean O2 as able 15. Persistent pulmonary edema  11/9- will give Lasix 40 mg daily x 3 days and KCL 40 mEq x3 days as well since K+ is borderline low.   11/11- will xray again Monday and wean O2 as tolerated  11/14- ordered CXR and see if can wean O2 better 16. Dispo  11/7- wife left note asking for "CNS w/u and endo work up as well as some more esoteric dx's"- will need to discuss with her before order any work up.    16. Urinary retention- 11/8- will start Flomax- foley out- in/out caths- will do allergy test to see if use lidocaine jelly- if so, order.   #18 17. Pressure ulcer on sacrum/coccyx- unstageable  11/10- will call gen surgery since so painful -see if can be opened up and drained?  11/11- ordered [facility]_ they are starting hydrotherapy M/W/F and wound care by nursing with santyl and saline gauze and foam dressing.   11/12- hydrotherapy went well- con't regimen  11/14- Hydrotherapy M/W/F- advised pt to STAY off butt unless eating or in therapy 18. Urinary retention  11/11- on Flomax 0.8 mg nightly- peeing some, but still needing caths- will change to q4 hours due to volumes  11/12- Tried to not cath- but was cathed for 1500cc last night- educated that he needs to be cathed for volumes >300cc.   11/13- pt told he he did NOT want foley   -increase flomax to 0.8mg  qpm (only on 0.4)   -begin urecholine  trial 10mg  tid   -OOB to empty when possible   -lidocaine gel with caths  11/14- pt hasn't required cath for 24- 48 hours  LOS: 8 days A FACE TO FACE EVALUATION WAS PERFORMED  Dejanee Thibeaux 09/07/2021, 8:30 AM

## 2021-09-07 NOTE — Consult Note (Signed)
[facility] Nurse wound follow up Patient receiving care in [facility] 4M06. PT at bedside for hydrotherapy treatment. Wound type: unstageable to coccyx area. See PT note for all specifics of the wound. In collaboration with PT, I have changed topical therapy to bid 1/4% Dakin's solution to begin today at 2000 for 5 days. Then to resume use of Santyl on Saturday 11/19. Bedside nurse to perform all times PT not performing hydrotherapy. Val Riles, RN, MSN, CWOCN, CNS-BC, pager [phone]

## 2021-09-07 NOTE — Progress Notes (Signed)
Pt bladder scanned for 425 at 0517. Refused I/O Cath. Pt stated he will try to void and needs a little time.  0615: Pt voided 475 and PVR  was 172

## 2021-09-07 NOTE — Progress Notes (Signed)
Occupational Therapy Session Note  Patient Details  Name: Jose [name] MRN: [id number] Date of Birth: Apr 09, 1976  Today's Date: 09/07/2021 OT Individual Time: 5621-3086 OT Individual Time Calculation (min): 54 min    Short Term Goals: Week 1:  OT Short Term Goal 1 (Week 1): Pt will tolerate sitting EOB ~10 minutes during an ADL session to demonstrate improvement in pain mgt OT Short Term Goal 2 (Week 1): Pt will complete 1/3 components of donning pants with AE as needed OT Short Term Goal 3 (Week 1): Pt will complete LB dressing at sit<stand level using LRAD  Skilled Therapeutic Interventions/Progress Updates:    Pt greeted at time of session semireclined in bed resting, just finished eating breakfast. Initial part of session focused on discussing CLOF, progress thus far, DC planning, etc. Pt adamant that he does feel some increase in strength and mobility, feels like he is progressing. Educated on importance of pushing self to maximize strength and mobility for increasing indep and decrease caregiver burden. Brushing teeth bed level with HOB slightly elevated to tolerance, set up assist. Declined further ADL at this time. AAROM for BLEs for the following: lower back stretch with hip flexion to almost 90* 1x5, single leg lifts 1x10 each, hip/knee flexion with resistance during extension 1x10 each, all for preparatory activity prior to mobility. Supine > sit with log roll mod/Max of 1 for mostly trunk elevation, able to sit EOB approx 5-7 minutes while therapist performing STM to rhomboids, upper trapezius, and sensory input for back 2/2 being on supine position for extended periods of time. Scoot up in bed 2 helpers, pt in significant pain after mobility but did not rate. Nursing aware and requested pain meds. Call bell in reach all needs met.     Therapy Documentation Precautions:  Precautions Precautions: Fall, Back Precaution Booklet Issued:  (verbally reviewed post-up back  precautions) Precaution Comments: 2L oxygen, high pain levels, PICC line Restrictions Weight Bearing Restrictions: No    Therapy/Group: Individual Therapy  Viona Gilmore 09/07/2021, 7:16 AM

## 2021-09-07 NOTE — Plan of Care (Signed)
  Problem: Consults Goal: RH SPINAL CORD INJURY PATIENT EDUCATION Description:  See Patient Education module for education specifics.  Outcome: Progressing Goal: Skin Care Protocol Initiated - if Braden Score 18 or less Description: If consults are not indicated, leave blank or document N/A Outcome: Progressing Goal: Diabetes Guidelines if Diabetic/Glucose > 140 Description: If diabetic or lab glucose is > 140 mg/dl - Initiate Diabetes/Hyperglycemia Guidelines & Document Interventions  Outcome: Progressing   Problem: SCI BOWEL ELIMINATION Goal: RH STG MANAGE BOWEL WITH ASSISTANCE Description: STG Manage Bowel with min Assistance. Outcome: Progressing Goal: RH STG SCI MANAGE BOWEL WITH MEDICATION WITH ASSISTANCE Description: STG SCI Manage bowel with medication with min assistance. Outcome: Progressing Goal: RH STG SCI MANAGE BOWEL PROGRAM W/ASSIST OR AS APPROPRIATE Description: STG SCI Manage bowel program with min assist or as appropriate. Outcome: Progressing   Problem: SCI BLADDER ELIMINATION Goal: RH STG MANAGE BLADDER WITH ASSISTANCE Description: STG Manage Bladder With min Assistance Outcome: Progressing Goal: RH STG MANAGE BLADDER WITH MEDICATION WITH ASSISTANCE Description: STG Manage Bladder With Medication With min Assistance. Outcome: Progressing Goal: RH STG MANAGE BLADDER WITH EQUIPMENT WITH ASSISTANCE Description: STG Manage Bladder With Equipment With min Assistance Outcome: Progressing   Problem: RH SKIN INTEGRITY Goal: RH STG SKIN FREE OF INFECTION/BREAKDOWN Description: Skin to remain free from additional breakdown with min assist. Outcome: Progressing Goal: RH STG MAINTAIN SKIN INTEGRITY WITH ASSISTANCE Description: STG Maintain Skin Integrity With min Assistance. Outcome: Progressing   Problem: RH SAFETY Goal: RH STG ADHERE TO SAFETY PRECAUTIONS W/ASSISTANCE/DEVICE Description: STG Adhere to Safety Precautions With cues and reminders. Outcome:  Progressing   Problem: RH PAIN MANAGEMENT Goal: RH STG PAIN MANAGED AT OR BELOW PT'S PAIN GOAL Description: < 2 on a 0-10 pain scale. Outcome: Progressing   Problem: RH KNOWLEDGE DEFICIT SCI Goal: RH STG INCREASE KNOWLEDGE OF SELF CARE AFTER SCI Description: Patient will demonstrate knowledge of medication/pain management, bowel/bladder program, and weight  bearing precautions with educational materials and handouts provided by staff independently at discharge. Outcome: Progressing   Problem: RH BOWEL ELIMINATION Goal: RH STG MANAGE BOWEL W/MEDICATION W/ASSISTANCE Description: STG Manage Bowel with Medication with Assistance. Outcome: Progressing   Problem: RH BLADDER ELIMINATION Goal: RH STG MANAGE BLADDER WITH MEDICATION WITH ASSISTANCE Description: STG Manage Bladder With Medication With Assistance. Outcome: Progressing   Problem: RH SKIN INTEGRITY Goal: RH STG SKIN FREE OF INFECTION/BREAKDOWN Outcome: Progressing Goal: RH STG MAINTAIN SKIN INTEGRITY WITH ASSISTANCE Description: STG Maintain Skin Integrity With Assistance. Outcome: Progressing Goal: RH STG ABLE TO PERFORM INCISION/WOUND CARE W/ASSISTANCE Description: STG Able To Perform Incision/Wound Care With Assistance. Outcome: Progressing   Problem: RH SAFETY Goal: RH STG DECREASED RISK OF FALL WITH ASSISTANCE Description: STG Decreased Risk of Fall With Assistance. Outcome: Progressing   Problem: RH PAIN MANAGEMENT Goal: RH STG PAIN MANAGED AT OR BELOW PT'S PAIN GOAL Outcome: Progressing   Problem: RH KNOWLEDGE DEFICIT GENERAL Goal: RH STG INCREASE KNOWLEDGE OF SELF CARE AFTER HOSPITALIZATION Outcome: Progressing

## 2021-09-07 NOTE — Consult Note (Signed)
Neuropsychological Consultation   Patient:   Jose [name]   DOB:   [birth year]  MR Number:  [id number]  Location:  [facility] [address] [place] [id number] [place] [address] Dept: [place]: [phone]           Date of Service:   09/07/2021  Start Time:   9 AM End Time:   10 AM  Provider/Observer:  Ilean Skill, Psy.D.       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  The patient had had 6 weeks history of chest pain radiating to bilateral shoulders.  Patient was admitted on 9/28-2010/10 for work-up.  He was found to have lytic lesions T5, T6 and sternum with pathologic fracture T6 as well as MSSA bacteremia and was discharged to home on IV antibiotics.  Patient was readmitted on 10/14 with worsening of pain and found to have increasing paraspinal phlegmon with new epidural phelegmon and interval T5 pathologic fracture with severe left eccentric canal stenosis and overall finding likely secondary to progressive discitis/osteomyelitis.  Patient was taken to the OR for T4 and T5 laminectomy with left T5 pediculotomy for decompression of spinal cord.  Bone biopsy was negative for malignancy.  Patient had extensive abdominal pain and GI issues as well as aspirated pneumonia and infection.  Concerns over PE and other pulmonary issues noted.  Patient with extensive medical issues including ulcers etc.  Patient has been steadily improving and was transferred to the comprehensive inpatient rehabilitation unit due to functional decline.  Reason for Service:  The patient was referred for neuropsychological consultation due to encephalopathy from extensive medical issues and coping and adjustment  with significant pain and extensive hospitalization.  Below is the HPI for the current admission.  HPI: Jose [name] is a 45 year old male with history of T2DM with neuropathy and retinopathy, CKD, Macular degeneration, GAD with 6 week history of chest pain radiating to bilateral shoulders who admitted 9/28- 10/10 for work up. He was found to have lytic lesions T5,T6 and sternum with pathologic fracture T6 as well as MSSA bacteremia and was discharged to home on IV antibiotics. He was readmitted on 10/14 with worsening of pain and found to have increased in paraspinal phelegmon with new epidural phelgmon and interval T5 pathologic fracture with severe left eccentric canal stenosis and overall finding likely secondary to progressive diskitis/osteomyelitis.   He was taken to OR for T4 and T5 laminectomy with left T5 pediculotomy for decompression of spinal cord by Dr. Kathyrn Sheriff. Work up for MM and bone biopsy negative for malignancy.   Hospital course significant for abdominal pain with distension with coffee ground emesis 10/16 followed by drop in Hgb 6.2 with hypoxia and confusion. He was found to have multifocal PNA due to aspiration and PCCM consulted. Patient required BIPAP as well as 2 units PRBC.  He was evaluated by GI and underwent UGI by Dr. Alessandra Bevels on 10/17 revealing severe esophagitis with black discoloration of mucosa, large amount of retained coffee ground fluid in stomach which was suctioned as well as few non bleeding superficial duodenal ulcers without stigmata of bleeding.  He was started on IV protonix and transitioned to po BID with recommendations to continue for 2 months followed  by daily dose.     CT head done negative for acute changes. He was started on fluconazole for candida esophagitis and CT chest showed possible enlargening effusions and abscesses. He was treated with stress dose steroids for ARDS and underwent thoracentesis of 250 cc of yellow clear fluid from left pleural  space on 10/21 and tolerated extubation by 10/24. Nephrology following for acute on chronic renal failure with supportive care. He continued to have issues with confusion as well as recurrent respiratory distress with drop in Hgb to 5.8 on 10/26 and was treated with 2 units PRBC and monitored with serial H/H. He had large melanotic stools on 10/27 with small amount of bloody emesis. He was treated with protonix drip and supportive care.    V/Q scan done 10/27 showing large perfusion defect in LUL not typical of PE but unable to exclude PE and CT chest not done due to renal issues. BLE dopplers negative for DVT.  He developed recurrent tarry stools and underwent repeat EGD showing improvement in esophagitis with bilious fluid in stomach and no gastric or duodenal ulcers-->no further GI work up planned and GI signed off.  Diet resumed but he continues on tube feeds due to poor po intake with episodic hypoglycemia. CBC showed H/H to be stable, bloody stools have resolved and SCr is steadily improving. He has completed course of diflucan and transitioned to ancef with end date of 12/01. He continues to be limited by back pain and weakness affecting ADLs and mobility. CIR recommended due to functional decline.  Current Status:  Patient was awake and alert and oriented with good mental status.  Patient knowledge that there was an extended period of time that he does not remember during his hospitalization and acknowledges that there was an extended period of time with significant cognitive difficulties.  The patient reports that he has been feeling significantly improved as far as cognition and he was oriented with good mental status today.  Patient with significant difficulties around memory for recent hospital events but memory over the past couple of days is much improved.  Patient reports that he has had more anxiety and worry but that also has been improving.  Patient denies any significant depression but does  acknowledge significant anxiety that is improving.   Behavioral Observation: Jose [name]  presents as a 45 y.o.-year-old Right handed Caucasian Male who appeared his stated age. his dress was Appropriate and he was Well Groomed and his manners were Appropriate to the situation.  his participation was indicative of Appropriate and Redirectable behaviors.  There were physical disabilities noted.  he displayed an appropriate level of cooperation and motivation.     Interactions:    Active Appropriate  Attention:   abnormal and attention span appeared shorter than expected for age  Memory:   abnormal; remote memory intact, recent memory impaired  Visuo-spatial:  not examined  Speech (Volume):  normal  Speech:   normal; normal  Thought Process:  Coherent and Relevant  Though Content:  WNL; not suicidal and not homicidal  Orientation:   person, place, time/date, and situation  Judgment:   Fair  Planning:   Fair  Affect:    Anxious  Mood:    Anxious  Insight:   Good  Intelligence:   normal   Medical History:   Past Medical History:  Diagnosis Date   GAD (generalized anxiety disorder)    Hyperlipidemia    Macular degeneration, bilateral    Retinopathy  Type II diabetes mellitus with complication, uncontrolled    retinopathy, neuropathy, microalbuminuria         Patient Active Problem List   Diagnosis Date Noted   Thoracic spondylosis with myelopathy 09/02/2021   Debility 08/30/2021   Osteomyelitis of thoracic region ([place]) 08/06/2021   Essential hypertension 08/06/2021   CKD (chronic kidney disease) stage 3, GFR 30-59 ml/min (HCC) 08/06/2021   Pleural effusion, bilateral 08/06/2021   Diabetic ulcer of left great toe ([place])    MSSA bacteremia 07/23/2021   Constipation 07/23/2021   Lytic bone lesions on xray 07/22/2021   Anemia 05/19/2021   Nausea and vomiting 05/19/2021   Elevated blood pressure reading 05/19/2021   Bilateral carpal tunnel syndrome  01/23/2020   Numbness 12/12/2019   Microalbuminuria due to type 2 diabetes mellitus ([place]) 07/20/2019   GAD (generalized anxiety disorder) 06/18/2019   Adult ADHD 06/18/2019   Trigger middle finger of left hand 06/11/2019   Diabetic retinopathy ([place]) 07/13/2018   Type 2 diabetes mellitus with diabetic polyneuropathy, without long-term current use of insulin ([place]) 05/17/2018   Tobacco use disorder 05/17/2018   Hyperlipidemia 05/17/2018         Psychiatric History:  Patient has a past psychiatric history that includes anxiety, some history of panic attacks, excessive worrying and difficulties with attention and concentration and completing tasks.  Patient also has had times with depression.  Psychiatric care has included diagnosis of generalized anxiety disorder and adult ADHD.  Whether or not his attentional issues are directly related to anxiety versus anxiety plus adult residual attention deficit disorder is difficult to ferret out at this time.  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  The patient had had 6 weeks history of chest pain radiating to bilateral shoulders.  Patient was admitted on 9/28-2010/10 for work-up.  He was found to have lytic lesions T5, T6 and sternum with pathologic fracture T6 as well as MSSA bacteremia and was discharged to home on IV antibiotics.  Patient was readmitted on 10/14 with worsening of pain and found to have increasing paraspinal phlegmon with new epidural phelegmon and interval T5 pathologic fracture with severe left eccentric canal stenosis and overall finding  likely secondary to progressive discitis/osteomyelitis.  Patient was taken to the OR for T4 and T5 laminectomy with left T5 pediculotomy for decompression of spinal cord.  Bone biopsy was negative for malignancy.  Patient had extensive abdominal pain and GI issues as well as aspirated pneumonia and infection.  Concerns over PE and other pulmonary issues noted.  Patient with extensive medical issues including ulcers etc.  Patient has been steadily improving and was transferred to the comprehensive inpatient rehabilitation unit due to functional decline.  Patient was awake and alert and oriented with good mental status.  Patient knowledge that there was an extended period of time that he does not remember during his hospitalization and acknowledges that there was an extended period of time with significant cognitive difficulties.  The patient reports that he has been feeling significantly improved as far as  cognition and he was oriented with good mental status today.  Patient with significant difficulties around memory for recent hospital events but memory over the past couple of days is much improved.  Patient reports that he has had more anxiety and worry but that also has been improving.  Patient denies any significant depression but does acknowledge significant anxiety that is improving.  Patient has a past psychiatric history that includes anxiety, some history of panic attacks, excessive worrying and difficulties with attention and concentration and completing tasks.  Patient also has had times with depression.  Psychiatric care has included diagnosis of generalized anxiety disorder and adult ADHD.  Whether or not his attentional issues are directly related to anxiety versus anxiety plus adult residual attention deficit disorder is difficult to ferret out at this time.  Disposition/Plan:  Today we worked on coping and adjustment issues and dealing with some of the increased anxiety he has experienced with  significant medical issues and extended hospital stay.  Diagnosis:    Follow-up exam - Plan: DG Abd 1 View, DG Chest 2 View, DG Abd 1 View, DG Chest 2 View  Constipation - Plan: DG Abd 1 View, DG Chest 2 View, DG Abd 1 View, [place] Chest 2 View  Pneumonia - Plan: DG CHEST PORT 1 VIEW, DG CHEST PORT 1 VIEW         Electronically Signed   _______________________ Ilean Skill, Psy.D. Clinical Neuropsychologist

## 2021-09-07 NOTE — Progress Notes (Signed)
Physical Therapy Session Note  Patient Details  Name: Jose [name] MRN: [id number] Date of Birth: 1976-06-24  Today's Date: 09/07/2021 PT Individual Time: 1030-1130, 1300-1353 PT Individual Time Calculation (min): 60 min, 53 min   Short Term Goals: Week 1:  PT Short Term Goal 1 (Week 1): Pt will complete bed mobility with minA and LRAD PT Short Term Goal 2 (Week 1): Pt will complete bed<>chair transfers with modA and LRAD PT Short Term Goal 3 (Week 1): Pt will tolerate sitting in recliner or TIS w/c for > 2 hours outside of therapies PT Short Term Goal 4 (Week 1): Pt will ambulate 77ft with modA and LRAD  Skilled Therapeutic Interventions/Progress Updates:    Session 1: pt received in bed and agreeable to therapy. Pt reports 5/10  pain, nsg administered medication during session. Pt requested to delay getting out of bed until PM session. DF stretch with AP talocrural glide for improvements in ROM for OOB activity. Active DF x 20 BIL. Assisted HS stretch, with noted increase in muscle tone from previous session. Pt then performed active assisted hip flexion to capitalize on ROM gain.  Pt requested to learn hip stretches he can performed on his own. Fig 4 piriformis stretch, pt then used position to put belt on foot for active stretching and exercise. Pt performed the following exercises to promote UE/LE strength and endurance:  Supine SL squats with band Banded supine abduction Superset with band secured OH, alternating punches and elbow extension.   Pt remained in bed at end of session and was left with all needs in reach and alarm active.   Session 2: pt received in bed and agreeable to therapy. Pt reported incr pain with activity, premedicated, rest breaks and holistic pain management techniques as needed. Pt agreeable to OOB activity. Supine>sit with min A for LE management and to bring trunk upright. CGA sitting EOB for comfort. Pt performed Stand pivot transfer EOB<>w/c with RW  and min A for power up and balance, +2 to manage equipment. Pt transported to therapy gym for time management and energy conservation. Gait in // bars 3 x 8 ft. CGA to stand in bars. Pt ambulated with VC for increased step length and height, R>L. Pt required extended rest break between bouts. After last bout, HR=72 and O2=100, maintained on 2L O2 throughout. Pt returned to room and returned to bed in the same manner as above. Sit>supine with min A for LE management. Pt then declined further activity d/t fatigue. Pt was left with all needs in reach and alarm active. Pt missed 17 min of scheduled PT d/t fatigue.   Therapy Documentation Precautions:  Precautions Precautions: Fall, Back Precaution Booklet Issued:  (verbally reviewed post-up back precautions) Precaution Comments: 2L oxygen, high pain levels, PICC line Restrictions Weight Bearing Restrictions: No General: PT Amount of Missed Time (min): 17 Minutes PT Missed Treatment Reason: Patient fatigue     Therapy/Group: Individual Therapy  [name] 09/07/2021, 11:11 AM

## 2021-09-08 ENCOUNTER — Inpatient Hospital Stay (HOSPITAL_COMMUNITY): Payer: 59

## 2021-09-08 LAB — CBC WITH DIFFERENTIAL/PLATELET
Abs Immature Granulocytes: 0.01 10*3/uL (ref 0.00–0.07)
Basophils Absolute: 0 10*3/uL (ref 0.0–0.1)
Basophils Relative: 0 %
Eosinophils Absolute: 0.5 10*3/uL (ref 0.0–0.5)
Eosinophils Relative: 8 %
HCT: 25.9 % — ABNORMAL LOW (ref 39.0–52.0)
Hemoglobin: 7.9 g/dL — ABNORMAL LOW (ref 13.0–17.0)
Immature Granulocytes: 0 %
Lymphocytes Relative: 24 %
Lymphs Abs: 1.6 10*3/uL (ref 0.7–4.0)
MCH: 28.3 pg (ref 26.0–34.0)
MCHC: 30.5 g/dL (ref 30.0–36.0)
MCV: 92.8 fL (ref 80.0–100.0)
Monocytes Absolute: 1.2 10*3/uL — ABNORMAL HIGH (ref 0.1–1.0)
Monocytes Relative: 18 %
Neutro Abs: 3.3 10*3/uL (ref 1.7–7.7)
Neutrophils Relative %: 50 %
Platelets: 259 10*3/uL (ref 150–400)
RBC: 2.79 MIL/uL — ABNORMAL LOW (ref 4.22–5.81)
RDW: 15.9 % — ABNORMAL HIGH (ref 11.5–15.5)
WBC: 6.7 10*3/uL (ref 4.0–10.5)
nRBC: 0 % (ref 0.0–0.2)

## 2021-09-08 LAB — GLUCOSE, CAPILLARY
Glucose-Capillary: 74 mg/dL (ref 70–99)
Glucose-Capillary: 128 mg/dL — ABNORMAL HIGH (ref 70–99)
Glucose-Capillary: 141 mg/dL — ABNORMAL HIGH (ref 70–99)
Glucose-Capillary: 139 mg/dL — ABNORMAL HIGH (ref 70–99)

## 2021-09-08 LAB — PROCALCITONIN: Procalcitonin: <0.1 ng/mL

## 2021-09-08 IMAGING — CT CT CHEST W/ CM
2 of 4 series · 15 of 36 positions shown, 18 images · IV contrast (omnipaque)
Comparison: CT from [DATE], chest x-ray from the previous day.

CLINICAL DATA: Chronic dyspnea

EXAM:
CT CHEST WITH CONTRAST
TECHNIQUE: Multidetector CT imaging of the chest was performed during
intravenous contrast administration.
CONTRAST:  75mL OMNIPAQUE IOHEXOL 300 MG/ML  SOLN

[Series 3: chest w · axial · 0.73mm/px · z∈[-328,-74]mm · 12 of 151 slices shown, 15 images]
[im 12/151  mediastinal]
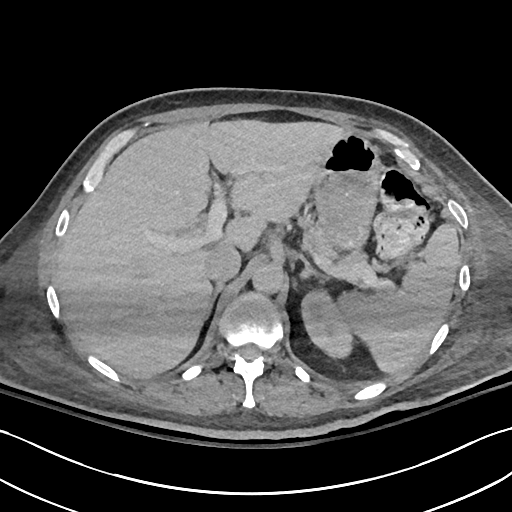
[im 12/151  lung]
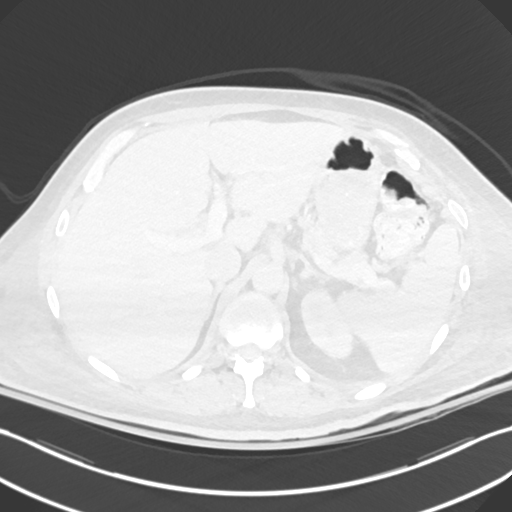
[im 24/151  lung]
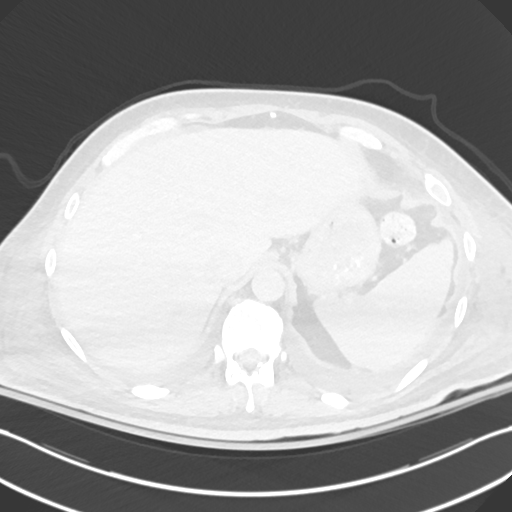
[im 35/151  lung]
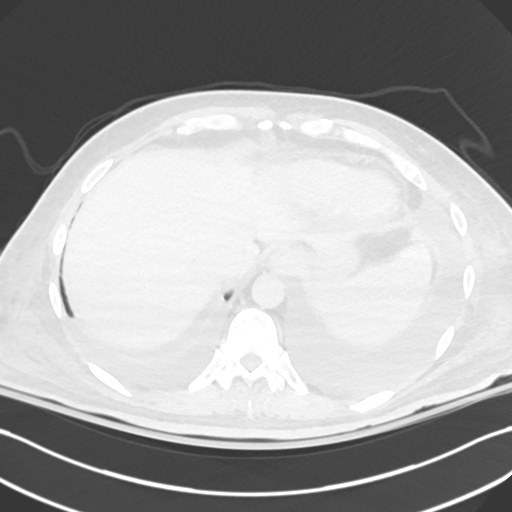
[im 47/151  lung]
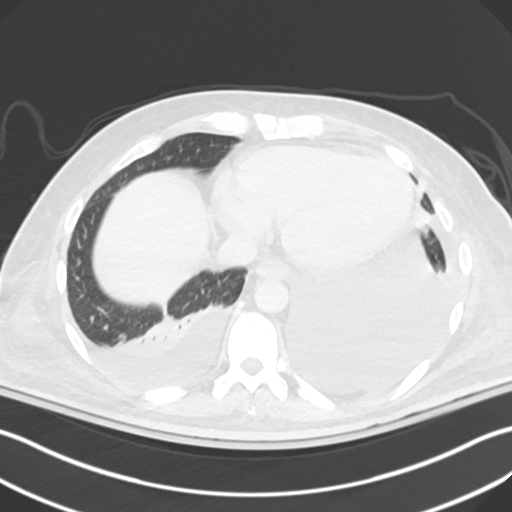
[im 58/151  mediastinal]
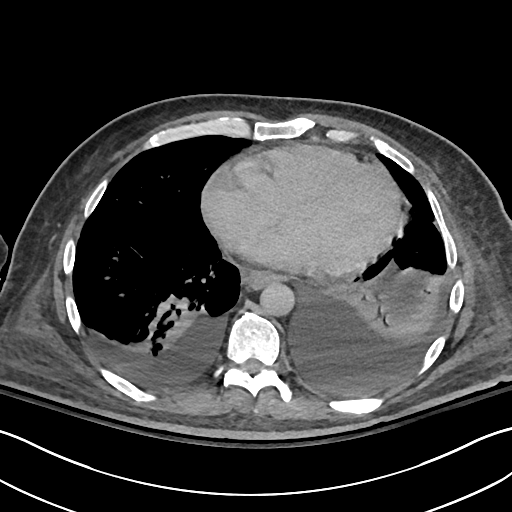
[im 58/151  lung]
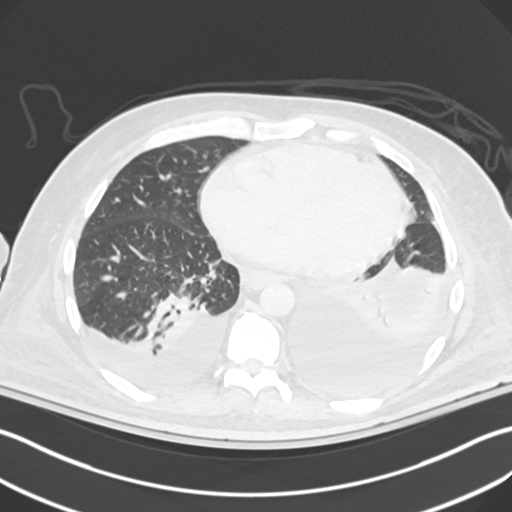
[im 70/151  lung]
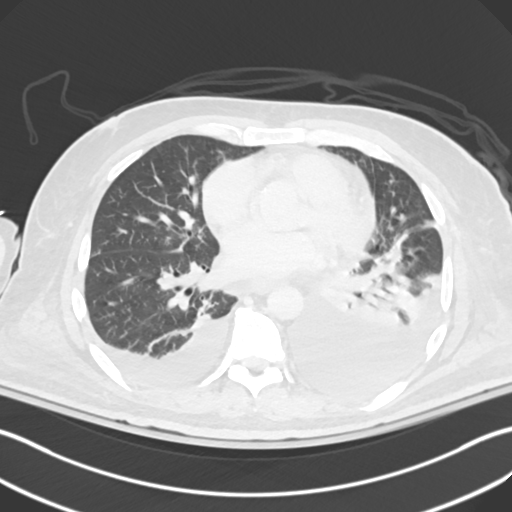
[im 81/151  lung]
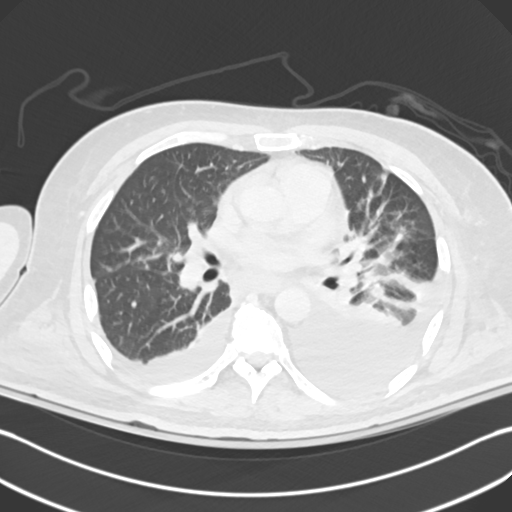
[im 93/151  lung]
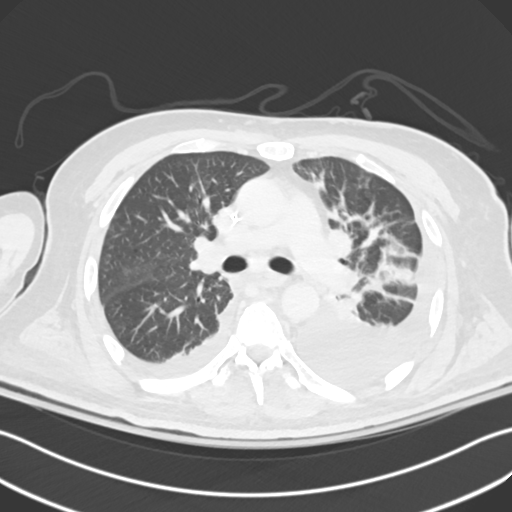
[im 104/151  mediastinal]
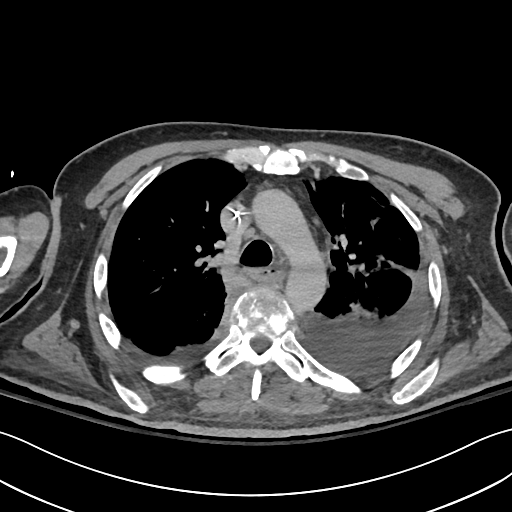
[im 104/151  lung]
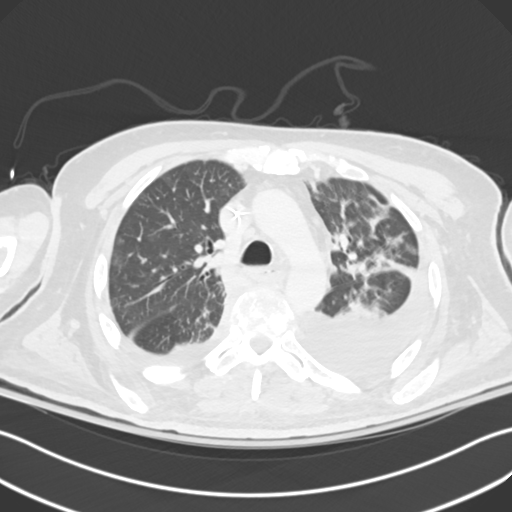
[im 116/151  lung]
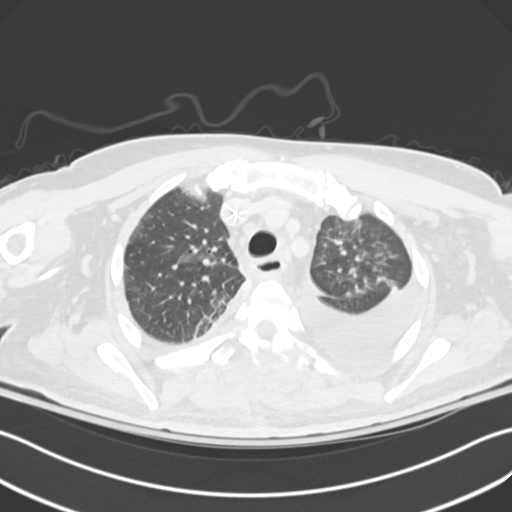
[im 127/151  lung]
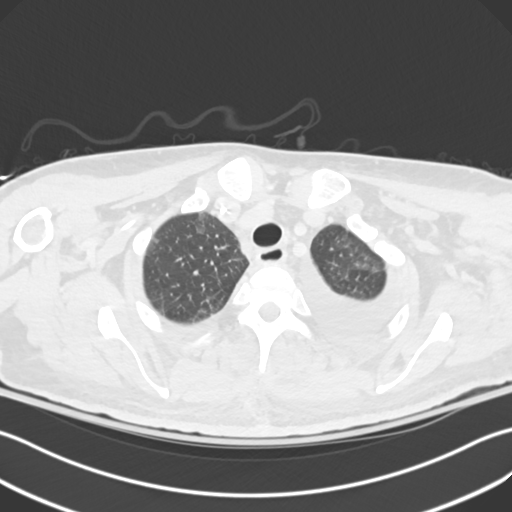
[im 139/151  lung]
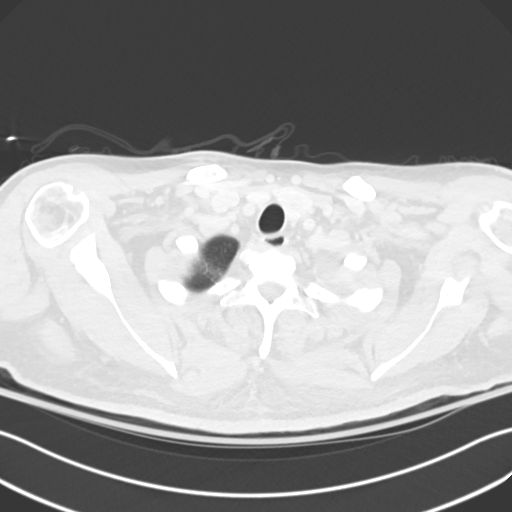

[Series 6: cor · coronal · 0.63mm/px · 3 of 135 slices shown]
[im 27/135  lung]
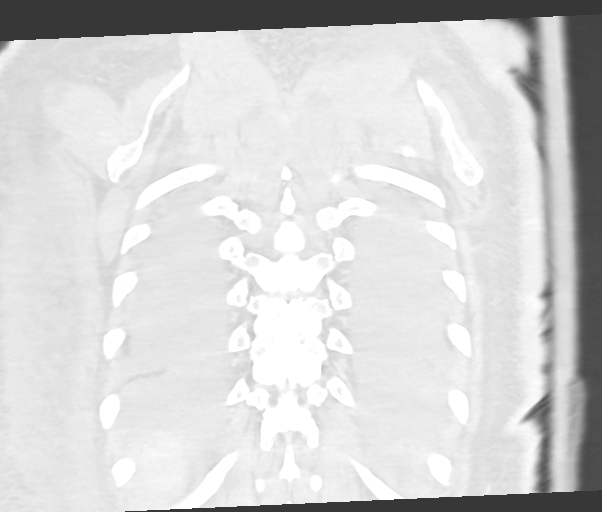
[im 54/135  lung]
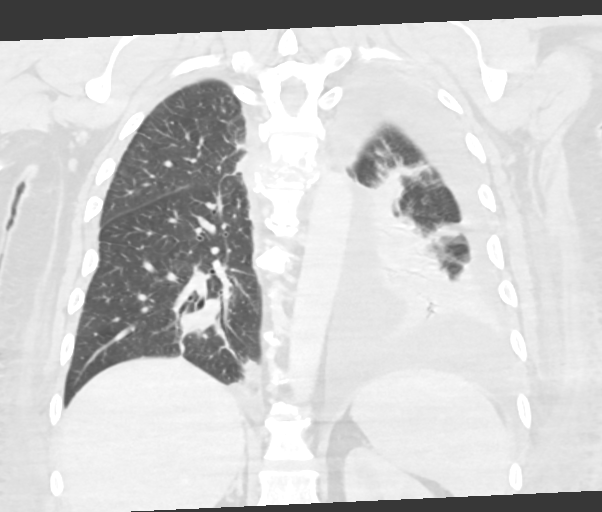
[im 81/135  lung]
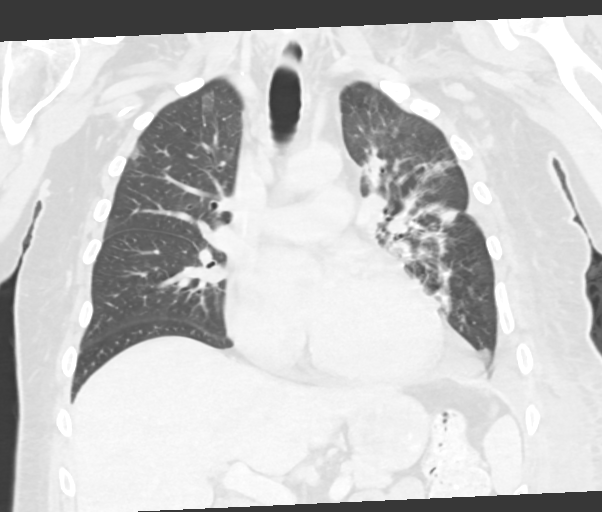

[15 of 36 positions shown; findings below may reference images not displayed]

FINDINGS: Cardiovascular: Thoracic aorta and its branches show no aneurysmal
dilatation or dissection. Heart is at the upper limits of normal in
size. Mild coronary calcifications are seen. The pulmonary artery as
visualized is within normal limits although limited by timing of the
contrast bolus. Right-sided PICC line is noted in the distal
superior vena cava.

Mediastinum/Nodes: Thoracic inlet demonstrates heterogeneity of the
thyroid stable in appearance from the prior exam. Scattered stable
subcarinal and right hilar lymph nodes are seen. Small mediastinal
lymph nodes are seen. These are stable in appearance from the prior
exam and likely reactive in nature. The esophagus as visualized
appears within normal limits.

Lungs/Pleura: Bilateral pleural effusions are noted left
considerably greater than right. Associated lower lobe infiltrate is
seen also left greater than right. These changes are relatively
stable from the prior CT examination. Some increase in right upper
lobe infiltrate is noted when compared with the prior exam. This
would correspond with that seen on recent plain film examination.

Upper Abdomen: Visualized upper abdomen shows no acute abnormality.

Musculoskeletal: Persistent destructive changes are noted at T4-5
with continued vertebral body height loss at T5 when compared with
the prior exam. Postsurgical changes are seen posteriorly consistent
with the given clinical history. No acute rib abnormality is seen.
Degenerative changes of the thoracic spine are noted. Postsurgical
changes are noted in the soft tissues posteriorly consistent with
the prior laminectomy.
IMPRESSION: Persistent destructive lytic changes at T4-5. The degree of bony
loss at T5 has increased in the interval from the prior exam.
Postsurgical changes are now seen.

Bilateral pleural effusions left considerably greater than right
which have increased in the interval from the prior exam. Bilateral
lower lobe consolidation worse on the left than the right is seen
with some patchy changes identified within the upper lobe on the
left when compared with the prior study. This corresponds to that
seen on recent plain film examination consistent with progressive
pneumonia.

Persistent reactive adenopathy is seen.

## 2021-09-08 MED ORDER — IOHEXOL 300 MG/ML  SOLN
75.0000 mL | Freq: Once | INTRAMUSCULAR | Status: AC | PRN
  Administered 2021-09-08: 75 mL via INTRAVENOUS

## 2021-09-08 NOTE — Progress Notes (Signed)
Occupational Therapy Session Note  Patient Details  Name: Jose [name] MRN: [id number] Date of Birth: 1976-04-10  Today's Date: 09/08/2021 OT Individual Time: 8453-6468 OT Individual Time Calculation (min): 28 min    Short Term Goals: Week 1:  OT Short Term Goal 1 (Week 1): Pt will tolerate sitting EOB ~10 minutes during an ADL session to demonstrate improvement in pain mgt OT Short Term Goal 2 (Week 1): Pt will complete 1/3 components of donning pants with AE as needed OT Short Term Goal 3 (Week 1): Pt will complete LB dressing at sit<stand level using LRAD  Skilled Therapeutic Interventions/Progress Updates:  Pt greeted supine in bed  agreeable to OT intervention. Pt reports progress made with other therapies and feeling encouraged, pt declined OOB activity as pt had just been OOB with previous session. Session focus on BUE [place] from bed level, pain mgmt strategies, and light shoulder stretching. Pt completed grasp and release of compliant cubes with BUEs from bed level. pt instructed to reach into cup to retrieve cubes, squeeze cube with pincer grasp and then place cube on chest to simulate reaching during ADL tasks. Pt completed task with supervision. Pt additionally completed lateral reaching from bed level  with pt instructed to pass larger compliant cube from RUE<>LUE and squeeze cube once  and pass to other hand, pt completed first trial for 1 min and then required 3 min rest break. Second trial completed for 1 min and 30 secs before fatiguing. Pt reports increased pain when reaching laterally vs completing hand to mouth pattern. Education provided on holistic pain mgmt strategies, such as deep breathing, body scans, imagery and coping strategies such as listening to music, pt verbalized understanding and appreciative of strategies. Additionally discussed working on scapular depression as pt reports most pain in shoulders causing increased tension, education provided on working on making  space between shoulders and ears to decreased tension/ pain in shoulders, pt agreeable. Pt left supine in bed with all needs within reach.              Therapy Documentation Precautions:  Precautions Precautions: Fall, Back Precaution Booklet Issued:  (verbally reviewed post-up back precautions) Precaution Comments: 2L oxygen, high pain levels, PICC line Restrictions Weight Bearing Restrictions: No  Pain: pt reports 8/10 pain, provided rest breaks and worked on pain mgmt strategies as indicated above.     Therapy/Group: Individual Therapy  [name] [facility] 09/08/2021, 10:15 AM

## 2021-09-08 NOTE — Progress Notes (Signed)
Occupational Therapy Session Note  Patient Details  Name: Jose [name] MRN: [id number] Date of Birth: Apr 21, 1976  Today's Date: 09/08/2021 OT Individual Time: 8588-5027 OT Individual Time Calculation (min): 57 min    Short Term Goals: Week 1:  OT Short Term Goal 1 (Week 1): Pt will tolerate sitting EOB ~10 minutes during an ADL session to demonstrate improvement in pain mgt OT Short Term Goal 2 (Week 1): Pt will complete 1/3 components of donning pants with AE as needed OT Short Term Goal 3 (Week 1): Pt will complete LB dressing at sit<stand level using LRAD   Skilled Therapeutic Interventions/Progress Updates:    Pt greeted at time of session semireclined in bed resting agreeable to OT session, pain at 4/10 at rest bed level. Reviewed with pt importance of participation and pushing self, agreeable to attempt [facility] transfer today as he had not done this thus far. Initially performing AAROM for BLEs and B shoulders for a warm up per pt request, focused on stretching at end range as he is sore from yesterday's session. Supine > sit Min/Mod of 1 and stand pivot bed > BSC with Min A of 1 and second helper present for pt confidence and safety 2/2 weakness. Pt able to sit on BSC for approx 10-15 minutes for face washing, oral hygiene, and OOB tolerance. Sit > stand Min/Mod and switched out BSC for TIS w/c. Attempting to position patient for comofrt with tilt feature, pillows, etc but did not improve comfort. Pt unable to tolerate sitting up in w/c and stand pivot back to bed Min A of 1 with RW. Note BP up in chair 145/75 and O2 checked throughout at 94% or higher on 2-2.5L. Pt in bed resting call bell in reach on O2 and all needs met. Note pain at 8/10 up in chair, RN aware and provided pain meds during session.  Therapy Documentation Precautions:  Precautions Precautions: Fall, Back Precaution Booklet Issued:  (verbally reviewed post-up back precautions) Precaution Comments: 2L oxygen, high pain  levels, PICC line Restrictions Weight Bearing Restrictions: No   Therapy/Group: Individual Therapy  Viona Gilmore 09/08/2021, 7:18 AM

## 2021-09-08 NOTE — Progress Notes (Signed)
PROGRESS NOTE    Pt reports pooped all night long- after was disimpacted.   Breathing "about the same".  Still on O2.  Had great day yesterday- got OOB x2; and actually went to gym; 72 hours without cath;     ROS:  Pt denies SOB, abd pain, CP, N/V/C/D, and vision changes   Objective:   DG CHEST PORT 1 VIEW  Result Date: 09/07/2021 CLINICAL DATA:  Shortness of breath EXAM: PORTABLE CHEST 1 VIEW COMPARISON:  Previous studies including the examination of 08/31/2021 FINDINGS: Transverse diameter of heart is increased. Tip of PICC line is seen in the superior vena cava. There is interval increase in infiltrates in the left parahilar region. Central pulmonary vessels are prominent. Increased interstitial and alveolar densities seen in the right parahilar region and right lower lung fields. There is homogeneous opacity in the medial left lower lung field obscuring the left hemidiaphragm. There is blunting of left lateral CP angle. There is no pneumothorax. IMPRESSION: There is interval worsening of infiltrates in the left parahilar region. There is interval increase in interstitial and alveolar markings in the right parahilar region. Findings suggest worsening of pulmonary edema or worsening of bilateral pneumonia. Homogeneous opacity in the medial left lower lung fields may suggest atelectasis/pneumonia in the left lower lobe. Small left pleural effusion is seen. Electronically Signed   By: Elmer Picker M.D.   On: 09/07/2021 09:51   Recent Labs    09/08/21 0248  WBC 6.7  HGB 7.9*  HCT 25.9*  PLT 259    Recent Labs    09/07/21 0335  NA 134*  K 4.1  CL 102  CO2 26  GLUCOSE 117*  BUN 16  CREATININE 1.23  CALCIUM 8.5*     Intake/Output Summary (Last 24 hours) at 09/08/2021 0839 Last data filed at 09/08/2021 0757 Gross per 24 hour  Intake 1440 ml  Output 2793 ml  Net -1353 ml     Pressure Injury 08/10/21 Coccyx  Medial Stage 2 -  Partial thickness loss of dermis presenting as a shallow open injury with a red, pink wound bed without slough. (Active)  08/10/21 1700  Location: Coccyx (gluteal crease)  Location Orientation: Medial  Staging: Stage 2 -  Partial thickness loss of dermis presenting as a shallow open injury with a red, pink wound bed without slough.  Wound Description (Comments):   Present on Admission: Yes     Pressure Injury 08/14/21 Buttocks Bilateral Deep Tissue Pressure Injury - Purple or maroon localized area of discolored intact skin or blood-filled blister due to damage of underlying soft tissue from pressure and/or shear. DTI (Active)  08/14/21 0800  Location: Buttocks  Location Orientation: Bilateral  Staging: Deep Tissue Pressure Injury - Purple or maroon localized area of discolored intact skin or blood-filled blister due to damage of underlying soft tissue from pressure and/or shear.  Wound Description (Comments): DTI  Present on Admission:     Physical Exam: Vital Signs Blood pressure (!) 146/82, pulse 76, temperature 98.3 F (36.8 C), resp. rate 18, height 5\' 11"  (1.803 m), weight 95 kg, SpO2 96 %.        General: awake, alert, appropriate, laying  in bed; wearing O2; NAD HENT: conjugate gaze; oropharynx moist CV: regular rate; no JVD Pulmonary: decreased at bases, B/L- sounds a little tight; but no W/R/R GI: soft, NT, ND, (+)BS- much less firm/normoactive Psychiatric: appropriate- brighter since did well yesterday Neurological: Ox3  Skin- skin open/raw around unstageable pressure ulcer Neuro. Alert and oriented x 3. Normal insight and awareness. Intact Memory. Normal language and speech. Cranial nerve exam unremarkable motor strength is 5/5 in bilateral deltoid, bicep, tricep, grip, 4-/5 hip flexor, knee extensors, 3/5 ankle dorsiflexor and plantar flexor Sensory exam reduced LT and Proprioception bilateral feet MS: no cogwheeling when back to examine- just pain  with ROM   Assessment/Plan: 1. Functional deficits which require 3+ hours per day of interdisciplinary therapy in a comprehensive inpatient rehab setting. Physiatrist is providing close team supervision and 24 hour management of active medical problems listed below. Physiatrist and rehab team continue to assess barriers to discharge/monitor patient progress toward functional and medical goals  Care Tool:  Bathing    Body parts bathed by patient: Right arm, Left arm, Chest, Abdomen, Front perineal area, Buttocks, Face   Body parts bathed by helper: Right upper leg, Left upper leg, Right lower leg, Left lower leg     Bathing assist Assist Level: Maximal Assistance - Patient 24 - 49%     Upper Body Dressing/Undressing Upper body dressing   What is the patient wearing?: Pull over shirt    Upper body assist Assist Level: Total Assistance - Patient < 25%    Lower Body Dressing/Undressing Lower body dressing      What is the patient wearing?: Pants, Underwear/pull up     Lower body assist Assist for lower body dressing: Maximal Assistance - Patient 25 - 49%     Toileting Toileting Toileting Activity did not occur (Clothing management and hygiene only): N/A (no void or bm) (pt had to be cathed)  Toileting assist Assist for toileting: 2 Helpers     Transfers Chair/bed transfer  Transfers assist  Chair/bed transfer activity did not occur: Safety/medical concerns  Chair/bed transfer assist level: 2 Helpers     Locomotion Ambulation   Ambulation assist   Ambulation activity did not occur: Safety/medical concerns  Assist level: 2 helpers Assistive device: Parallel bars Max distance: 8 ft   Walk 10 feet activity   Assist  Walk 10 feet activity did not occur: Safety/medical concerns        Walk 50 feet activity   Assist Walk 50 feet with 2 turns activity did not occur: Safety/medical concerns         Walk 150 feet activity   Assist Walk 150 feet  activity did not occur: Safety/medical concerns         Walk 10 feet on uneven surface  activity   Assist Walk 10 feet on uneven surfaces activity did not occur: Safety/medical concerns         Wheelchair     Assist Is the patient using a wheelchair?: Yes Type of Wheelchair: Manual Wheelchair activity did not occur: Safety/medical concerns         Wheelchair 50 feet with 2 turns activity    Assist    Wheelchair 50 feet with 2 turns activity did not occur: Safety/medical concerns       Wheelchair 150 feet activity     Assist  Wheelchair 150 feet activity did not occur: Safety/medical concerns       Blood pressure (!) 146/82, pulse 76, temperature 98.3 F (36.8  C), resp. rate 18, height 5\' 11"  (1.803 m), weight 95 kg, SpO2 96 %.    Medical Problem List and Plan: 1.  Decline in self care and mobility  secondary to thoracic myelopathy/thoracic osteomyelitis.              -patient may  shower             -ELOS/Goals: 10-12d  Con't PT and OT_ dteam conference today- d/c 3 weeks or so since pain so limiting.   Con't PT and OT- team conference today to determine actual d/c date/pain less limiting yesterday 2.  Antithrombotics: -DVT/anticoagulation:  N/A             -antiplatelet therapy: none 3. Pain Management: Oxycodone prn. 11/8- will increase oxy to q4 hours prn- might need long acting?   11/9- will add MS Contin 15 mg BID for pain- and monitor  11/13- pain much better with seating/new low air loss mattress-  stable  11/14- pt reports not taking prns much- advised him to take at least 3-4x/day so can participate in therapy-he hasn't been- trying to avoid pain meds as much as possible- will make percocet 1-2 tabs q4 hours prn and con't MS Contin  11/15- pain better yesterday- con't to encourage pt to take meds prior to therapy.  4. Mood: LCSW to follow for evaluation and support.              -antipsychotic agents: N/A 5. Neuropsych: This patient is  intermittently capable of making decisions on his own behalf. 6. Skin/Wound Care: Air mattress for pressure relief measures.  7. Fluids/Electrolytes/Nutrition: Monitor I/O. Check lytes in am.  8. MSSA bacteremia: On ancef with end date 12/01 9. UGIB/melena: Has received 11 units PRBC during admission --Continue protonix BID w/ carafate ac/hs.  --Monitor for signs of bleeding.  11/9- will recheck labs today at 3pm- to make sure not dropping more since is s/p so many units of pRBCs.   11/10- Hb back up to 8.1 this AM- will con'tto monitor  11/14- labs in AM 10. T2DM: Po intake poor --continue TF at nights. Continue insulin gargline 12 units at nights --monitor BS every 4 hours with SSI 11/7- BG's look great- still poor appetite, but I think thrush is making it worse- things "taste weird". Con't to monitor and regimen 11/10- BG's 88-184- labile, but usually better cotnrolled- con't regimen 11/13 semglee reduced to 10u yesterday--cbg 95 this morning. Observe today 11/15- BG's 74- 140- 1 episode of 234, but otherwise, well controlled 11. HTN: Monitor BP TID--continue Norvasc and metoprolol for control.   11/13 borderline control  systolic- no changes   26/83- BP a little elevated - think it's pain- will titrate up on metoprolol 12. ABLA: On aranesp weekly for supplement.  11/7- Hb stable at 8.4- con't to monitor  11/9- Hb today is 7.6- down from 8.4- will recheck in AM to verify- hemodilution?   11/10- back up to 8.1- will monitor  11/15- Hb 7.9- con't regimen 13. Acute renal insufficiency- 11/7- improving- Cr down to 1.37 and BUN down to 37-   11/9- will recheck in AM- is stable last time   11/10- Cr 1.33- BUN 24 14. O2 need/acute resp failure  11/7- will wean O2 as able 15. Persistent pulmonary edema vs B/L pneumonia  11/9- will give Lasix 40 mg daily x 3 days and KCL 40 mEq x3 days as well since K+ is borderline low.   11/11- will xray again Monday and  wean O2 as tolerated  11/14-  ordered CXR and see if can wean O2 better  11/15- CXR looks worse- B/L pulmonary edema vs pneumonia- WBC is 6.7 no left shift- will ask IM to help with managing  16. Dispo  11/7- wife left note asking for "CNS w/u and endo work up as well as some more esoteric dx's"- will need to discuss with her before order any work up.    16. Urinary retention- 11/8- will start Flomax- foley out- in/out caths- will do allergy test to see if use lidocaine jelly- if so, order.   #18 17. Pressure ulcer on sacrum/coccyx- unstageable  11/10- will call gen surgery since so painful -see if can be opened up and drained?  11/11- ordered WOC_ they are starting hydrotherapy M/W/F and wound care by nursing with santyl and saline gauze and foam dressing.   11/12- hydrotherapy went well- con't regimen  11/14- Hydrotherapy M/W/F- advised pt to STAY off butt unless eating or in therapy 18. Urinary retention  11/11- on Flomax 0.8 mg nightly- peeing some, but still needing caths- will change to q4 hours due to volumes  11/12- Tried to not cath- but was cathed for 1500cc last night- educated that he needs to be cathed for volumes >300cc.   11/13- pt told he he did NOT want foley   -increase flomax to 0.8mg  qpm (only on 0.4)   -begin urecholine trial 10mg  tid   -OOB to empty when possible   -lidocaine gel with caths  11/14- pt hasn't required cath for 24- 48 hours  11/15- hasn't had cath in 72 hours- but has refused some that were offered.   I spent a total of 37 minutes on total care- >50% coordination of care- calling and speaking with IM as well as team conference. Also d/w pt 2x about plan.   LOS: 9 days A FACE TO FACE EVALUATION WAS PERFORMED  Saniya Tranchina 09/08/2021, 8:39 AM

## 2021-09-08 NOTE — Progress Notes (Signed)
Patient ID: Jose [name], male   DOB: 1976/04/16, 45 y.o.   MRN: [id number]  Met with pt and Dad who is in his room to update on team conference goals of supervision level and discharge date of 11/28. Pt has made good progress this week and is feeling less pain now. He really wants to shoot to be home by Thanksgiving and he has a tall order. He is aware he needs to stay off his bottom and will try to stay up in his wheelchair more. He needs to push himself and plans on doing this now. Closer to discharge date will have wife come in and attend therapies with pt.

## 2021-09-08 NOTE — Consult Note (Signed)
Medical Consultation   Jose [name]  VJK:[id number]  DOB: [birth year]  DOA: 08/30/2021  PCP: [name], NP   Requesting physician: Dr. Dagoberto Ligas  Reason for consultation: Persistent hypoxemia with worsening infiltrates on CXR   History of Present Illness: Jose [name] is an 45 y.o. male with history of type 2 diabetes with neuropathy, CKD, macular degeneration, and generalized anxiety disorder who was noted to have over 6 weeks history of chest pain radiating to his bilateral shoulders and was admitted in September for work-up.  He was noted to have lytic lesions of T5, T6 and his sternum with pathological fracture of T6.  He was also noted to have MSSA bacteremia and was discharged home on IV antibiotics.  He was readmitted on 10/14 with worsening pain and found to have increasing paraspinal phlegmon and interval T5 pathologic fracture with severe left eccentric canal stenosis and other findings concerning for progressive discitis/osteomyelitis.  He was taken to the OR for T4 and T5 laminectomy with left T5 ventriculotomy for decompression of the spinal cord.  He has had severe abdominal pain and GI issues as well as aspiration pneumonia.  He was noted to have severe anemia with concern for GI bleed as well during his hospitalization and required PRBC transfusion.  He was also treated for Candida esophagitis in the interim.  He was noted to have significant medical issues involving sacral wound as well.  He appears to be steadily improving and has been transferred to the rehabilitation unit due to his functional decline.    He now continues to have some persistent hypoxemia noted, but no severe symptoms related to this.  His chest x-ray was performed on 11/14 demonstrating some new infiltrates bilaterally and changes concerning for pulmonary edema versus pneumonia.  His fluid balance appears negative, but it is unclear whether or not this is being carefully monitored.  He  denies any cough or sputum production, fevers or chills.  No lower extremity edema or orthopnea noted.  He has not been using his incentive spirometer and states that he is just now starting to get out of bed for more mobilization.  Review of Systems:  ROS As per HPI otherwise 10 point review of systems negative.    Past Medical History: Past Medical History:  Diagnosis Date   GAD (generalized anxiety disorder)    Hyperlipidemia    Macular degeneration, bilateral    Retinopathy    Type II diabetes mellitus with complication, uncontrolled    retinopathy, neuropathy, microalbuminuria    Past Surgical History: Past Surgical History:  Procedure Laterality Date   APPENDECTOMY     BIOPSY  08/10/2021   Procedure: BIOPSY;  Surgeon: Otis Brace, MD;  Location: [place];  Service: Gastroenterology;;   BUBBLE STUDY  07/29/2021   Procedure: BUBBLE STUDY;  Surgeon: Jerline Pain, MD;  Location: [place] [facility];  Service: Cardiovascular;;   ESOPHAGOGASTRODUODENOSCOPY (EGD) WITH PROPOFOL N/A 08/10/2021   Procedure: ESOPHAGOGASTRODUODENOSCOPY (EGD) WITH PROPOFOL;  Surgeon: Otis Brace, MD;  Location: [place];  Service: Gastroenterology;  Laterality: N/A;   ESOPHAGOGASTRODUODENOSCOPY (EGD) WITH PROPOFOL N/A 08/24/2021   Procedure: ESOPHAGOGASTRODUODENOSCOPY (EGD) WITH PROPOFOL;  Surgeon: Otis Brace, MD;  Location: [place];  Service: Gastroenterology;  Laterality: N/A;   HERNIA REPAIR     IR FLUORO GUIDED NEEDLE PLC ASPIRATION/INJECTION LOC  07/28/2021   LUMBAR LAMINECTOMY/DECOMPRESSION MICRODISCECTOMY N/A 08/07/2021   Procedure: THORACIC FOUR - THORACIC FIVE LAMINECTOMY/DECOMPRESSION  OF SPINAL CORD, DEBRIDEMENT OF ABSCESS, MICRODISCECTOMY, INTRAOPERATIVE ULTRASOUND;  Surgeon: Consuella Lose, MD;  Location: [place];  Service: Neurosurgery;  Laterality: N/A;   TEE WITHOUT CARDIOVERSION N/A 07/29/2021   Procedure: TRANSESOPHAGEAL ECHOCARDIOGRAM (TEE);  Surgeon: Jerline Pain, MD;  Location: [facility] ENDOSCOPY;  Service: Cardiovascular;  Laterality: N/A;   TRIGGER FINGER RELEASE Right 10/25/2019   Procedure: RIGHT INDEX FINGER RELEASE TRIGGER FINGER/A-1 PULLEY;  Surgeon: Daryll Brod, MD;  Location: [place];  Service: Orthopedics;  Laterality: Right;  IV REGIONAL FOREARM BLOCK     Allergies:   Allergies  Allergen Reactions   Cranberry Itching   Hm Lidocaine Patch [Lidocaine] Dermatitis    Blisters skin    Melatonin Other (See Comments)    nightmares     Social History:  reports that he quit smoking about 13 months ago. His smoking use included cigarettes. He has a 20.00 pack-year smoking history. He has never used smokeless tobacco. He reports that he does not currently use alcohol. He reports that he does not use drugs.   Family History: Family History  Problem Relation Age of Onset   Diabetes Mother    Hyperlipidemia Mother    Stroke Mother    Diabetes Father    Hyperlipidemia Brother    Stroke Brother    ADD / ADHD Brother    ADD / ADHD Son     Physical Exam: Vitals:   09/07/21 0506 09/07/21 1406 09/07/21 1933 09/08/21 0559  BP: (!) 156/76 (!) 146/78 (!) 155/80 (!) 146/82  Pulse: 88 71 79 76  Resp: 16 20 18 18   Temp: 98.7 F (37.1 C) 98.3 F (36.8 C) 98.5 F (36.9 C) 98.3 F (36.8 C)  TempSrc:   Oral   SpO2: 100% 98% 97% 96%  Weight:      Height:        Constitutional: Alert and awake, oriented x3, not in any acute distress. Eyes: PERLA, EOMI, irises appear normal, anicteric sclera,  ENMT: external ears and nose appear normal            Lips appears normal, oropharynx mucosa, tongue, posterior pharynx appear normal  Neck: neck appears normal, no masses, normal ROM, no thyromegaly, no JVD  CVS: S1-S2 clear, no murmur rubs or gallops, no LE edema, normal pedal pulses  Respiratory:  clear to auscultation bilaterally, no wheezing, rales or rhonchi. Respiratory effort normal. No accessory muscle use. Currently on  2.5L [place]. Abdomen: soft nontender, nondistended, normal bowel sounds, no hepatosplenomegaly, no hernias  Musculoskeletal: : no cyanosis, clubbing or edema noted bilaterally                    Data reviewed:  I have personally reviewed following labs and imaging studies Labs:  CBC: Recent Labs  Lab 09/02/21 0359 09/02/21 1620 09/03/21 0404 09/08/21 0248  WBC 6.3 6.2 6.4 6.7  NEUTROABS 3.6 3.5 3.4 3.3  HGB 7.6* 8.2* 8.1* 7.9*  HCT 24.2* 25.7* 25.8* 25.9*  MCV 92.0 91.8 92.5 92.8  PLT 279 278 300 423    Basic Metabolic Panel: Recent Labs  Lab 09/02/21 1620 09/03/21 0404 09/07/21 0335  NA 133* 134* 134*  K 3.6 3.6 4.1  CL 105 105 102  CO2 21* 23 26  GLUCOSE 217* 103* 117*  BUN 28* 24* 16  CREATININE 1.31* 1.33* 1.23  CALCIUM 7.9* 8.1* 8.5*   GFR Estimated Creatinine Clearance: 87.4 mL/min (by C-G formula based on SCr of 1.23 mg/dL). Liver Function Tests:  No results for input(s): AST, ALT, ALKPHOS, BILITOT, PROT, ALBUMIN in the last 168 hours. No results for input(s): LIPASE, AMYLASE in the last 168 hours. No results for input(s): AMMONIA in the last 168 hours. Coagulation profile No results for input(s): INR, PROTIME in the last 168 hours.  Cardiac Enzymes: No results for input(s): CKTOTAL, CKMB, CKMBINDEX, TROPONINI in the last 168 hours. BNP: Invalid input(s): POCBNP CBG: Recent Labs  Lab 09/07/21 1133 09/07/21 1638 09/07/21 2111 09/08/21 0601 09/08/21 1141  GLUCAP 234* 114* 140* 74 128*   D-Dimer No results for input(s): DDIMER in the last 72 hours. Hgb A1c No results for input(s): HGBA1C in the last 72 hours. Lipid Profile No results for input(s): CHOL, HDL, LDLCALC, TRIG, CHOLHDL, LDLDIRECT in the last 72 hours. Thyroid function studies No results for input(s): TSH, T4TOTAL, T3FREE, THYROIDAB in the last 72 hours.  Invalid input(s): FREET3 Anemia work up No results for input(s): VITAMINB12, FOLATE, FERRITIN, TIBC, IRON, RETICCTPCT in the last 72  hours. Urinalysis    Component Value Date/Time   COLORURINE AMBER (A) 08/14/2021 1359   APPEARANCEUR CLOUDY (A) 08/14/2021 1359   APPEARANCEUR Clear 05/05/2018 1211   LABSPEC 1.027 08/14/2021 1359   PHURINE 5.0 08/14/2021 1359   GLUCOSEU 50 (A) 08/14/2021 1359   HGBUR NEGATIVE 08/14/2021 1359   BILIRUBINUR NEGATIVE 08/14/2021 1359   BILIRUBINUR negative 09/12/2018 0919   BILIRUBINUR Negative 05/05/2018 1211   KETONESUR 5 (A) 08/14/2021 1359   PROTEINUR 100 (A) 08/14/2021 1359   UROBILINOGEN 1.0 09/12/2018 0919   NITRITE NEGATIVE 08/14/2021 1359   LEUKOCYTESUR NEGATIVE 08/14/2021 1359     Sepsis Labs Invalid input(s): PROCALCITONIN,  WBC,  LACTICIDVEN Microbiology No results found for this or any previous visit (from the past 240 hour(s)).   Inpatient Medications:   Scheduled Meds:  amLODipine  10 mg Oral Daily   bethanechol  10 mg Oral TID   Chlorhexidine Gluconate Cloth  6 each Topical Daily   [START ON 09/12/2021] collagenase   Topical Daily   darbepoetin (ARANESP) injection - NON-DIALYSIS  150 mcg Subcutaneous Q Mon-1800   feeding supplement  1 Container Oral TID WC   gabapentin  200 mg Oral BID   insulin aspart  0-20 Units Subcutaneous TID AC   insulin glargine-yfgn  10 Units Subcutaneous QHS   lidocaine  1 application Urethral A1P   mouth rinse  15 mL Mouth Rinse BID   metoprolol tartrate  50 mg Oral BID   mirtazapine  15 mg Oral QHS   morphine  15 mg Oral Q12H   mupirocin ointment  1 application Topical BID   nutrition supplement (JUVEN)  1 packet Oral BID BM   pantoprazole  40 mg Oral BID   polyethylene glycol  17 g Oral Daily   protein supplement  1 Scoop Oral TID WC   senna-docusate  2 tablet Oral Q0600   sodium chloride flush  10-40 mL Intracatheter Q12H   sodium hypochlorite   Topical BID   sucralfate  1 g Oral TID WC & HS   tamsulosin  0.8 mg Oral QPC supper   Continuous Infusions:   ceFAZolin (ANCEF) IV 2 g (09/08/21 0813)     Radiological  Exams on Admission: DG CHEST PORT 1 VIEW  Result Date: 09/07/2021 CLINICAL DATA:  Shortness of breath EXAM: PORTABLE CHEST 1 VIEW COMPARISON:  Previous studies including the examination of 08/31/2021 FINDINGS: Transverse diameter of heart is increased. Tip of PICC line is seen in the superior vena cava.  There is interval increase in infiltrates in the left parahilar region. Central pulmonary vessels are prominent. Increased interstitial and alveolar densities seen in the right parahilar region and right lower lung fields. There is homogeneous opacity in the medial left lower lung field obscuring the left hemidiaphragm. There is blunting of left lateral CP angle. There is no pneumothorax. IMPRESSION: There is interval worsening of infiltrates in the left parahilar region. There is interval increase in interstitial and alveolar markings in the right parahilar region. Findings suggest worsening of pulmonary edema or worsening of bilateral pneumonia. Homogeneous opacity in the medial left lower lung fields may suggest atelectasis/pneumonia in the left lower lobe. Small left pleural effusion is seen. Electronically Signed   By: Elmer Picker M.D.   On: 09/07/2021 09:51    Impression/Recommendations Principal Problem:   Osteomyelitis of thoracic region [facility]) Active Problems:   Debility   Thoracic spondylosis with myelopathy  Acute hypoxemic respiratory failure -Difficulty weaning oxygen requirement, currently at 2.5 L nasal cannula -Incentive spirometry and mobilization to further assist with weaning -Chest x-ray findings concerning for worsening edema/infiltrate for which chest CT will be ordered with contrast to evaluate further -Further recommendations pending the results of this finding -Patient recently has received some Lasix and appears to be with negative fluid balance, continue to monitor strict I's and O's as well as daily weights -Echo TEE on 07/29/2021 with LVEF 60-65% -Procalcitonin   -BNP   Thoracic myelopathy/osteomyelitis -Continue management as prior  Debility/deconditioning -Continue therapy per rehab protocol  Type 2 diabetes-stable -Continue current management  Hypertension-stable -Continue current management  Unstageable pressure ulcer -Hydrotherapy  Urinary retention -Continue Flomax and Urecholine -Refusing Foley  Thank you for this consultation.  Our [facility] hospitalist team will follow the patient with you.   Time Spent: 45 minutes  Rodena Goldmann M.D. [facility] 09/08/2021, 1:04 PM

## 2021-09-08 NOTE — Progress Notes (Signed)
Physical Therapy Weekly Progress Note  Patient Details  Name: Jose [name] MRN: [id number] Date of Birth: 12-12-1975  Beginning of progress report period: August 31, 2021 End of progress report period: September 08, 2021  Today's Date: 09/08/2021 PT Individual Time: 3235-5732, 2025-4270 PT Individual Time Calculation (min): 53 min, 53 min  Patient has met 3 of 4 short term goals.  Pt is progressing toward gait and OOB goals. Pt has utilized TIS w/c during therapy sessions but has been limited by pain and unable to remain in chair after sessions.   Patient continues to demonstrate the following deficits muscle weakness and muscle joint tightness, abnormal tone, unbalanced muscle activation, and decreased coordination, and decreased standing balance, decreased postural control, decreased balance strategies, and difficulty maintaining precautions and therefore will continue to benefit from skilled PT intervention to increase functional independence with mobility. Stand pivot transfers with RW and min A. Gait up to 100 ft with RW and w/c follow.   Patient progressing toward long term goals..  Continue plan of care.  PT Short Term Goals Week 1:  PT Short Term Goal 1 (Week 1): Pt will complete bed mobility with minA and LRAD PT Short Term Goal 1 - Progress (Week 1): Met PT Short Term Goal 2 (Week 1): Pt will complete bed<>chair transfers with modA and LRAD PT Short Term Goal 2 - Progress (Week 1): Met PT Short Term Goal 3 (Week 1): Pt will tolerate sitting in recliner or TIS w/c for > 2 hours outside of therapies PT Short Term Goal 3 - Progress (Week 1): Progressing toward goal PT Short Term Goal 4 (Week 1): Pt will ambulate 48f with modA and LRAD PT Short Term Goal 4 - Progress (Week 1): Met Week 2:  PT Short Term Goal 1 (Week 2): Pt will tolerate sitting OOB between sessions PT Short Term Goal 2 (Week 2): Pt will ambulate x 200 ft with RW PT Short Term Goal 3 (Week 2): Pt will navigate  6" stair with LRAD  Skilled Therapeutic Interventions/Progress Updates:  Session 1: pt received in bed and agreeable to therapy. Pt reports 7/10 pain, better managed than previous days. Pt requests to stretch before moving. Passive stretching to BIL ankles, hamstrings, hip IR/ER 2 x 30 sec. Supine<>sit with CGA. Pt then stood with as little as CGA x 2 and has much as min x 2 to RW. Pt performed mini squats 3 x 8 and side stepping 2 x 2 laps of length of bed for improved LE strength and transfers. Pt returned to bed, c/o discomfort in hip flexors. Discussed possible ways to stretch hip flexors and posterior hip. Pt remained in bed at close of session with all needs in reach.    Session 2: pt received in bed and agreeable to therapy. Pt premedicated for pain, reporting incr pain in shoulder blade, addressed with MT. Supine<>sit with CGA. Sit to stand to RW min A throughout session, CGA at times. Pt transported to hall and ambulated with min A and +2 for w/c follow and equipment management 2 x 100 ft with extended rest break. Demoes trunk flexion which improved with cues for walker proximity and upright posture. Pt then performed step ups on 3 " stair, 2 x 5 BIL with 1 knee buckle, mod A to recover. Pt demoes poor eccentric quad control while descending steps. Returned to room and performed Stand pivot transfer in same manner to return to EOB, x 5 squats to tap bed with CGA and  RW for improved eccentric control. Pt returned to bed with CGA and was left with all needs in reach and alarm active.       Therapy Documentation Precautions:  Precautions Precautions: Fall, Back Precaution Booklet Issued:  (verbally reviewed post-up back precautions) Precaution Comments: 2L oxygen, high pain levels, PICC line Restrictions Weight Bearing Restrictions: No General:     Therapy/Group: Individual Therapy  [name] 09/08/2021, 3:08 PM

## 2021-09-08 NOTE — Patient Care Conference (Signed)
Inpatient RehabilitationTeam Conference and Plan of Care Update Date: 09/08/2021   Time: 11:38 AM    Patient Name: Jose [name]      Medical Record Number: [id number]  Date of Birth: [birth year] Sex: Male         Room/Bed: 4M06C/4M06C-01 Payor Info: Payor: Theme park manager / Plan: Theme park manager OTHER / Product Type: *No Product type* /    Admit Date/Time:  08/30/2021  3:23 PM  Primary Diagnosis:  Osteomyelitis of thoracic region [facility])  Hospital Problems: Principal Problem:   Osteomyelitis of thoracic region [facility]) Active Problems:   Debility   Thoracic spondylosis with myelopathy    Expected Discharge Date: Expected Discharge Date: 09/21/21  Team Members Present: Physician leading conference: Dr. Courtney Heys Social Worker Present: Ovidio Kin, LCSW Nurse Present: Dorthula Nettles, RN PT Present: Ailene Rud, PT OT Present: Lillia Corporal, OT PPS Coordinator present : Ileana Ladd, PT     Current Status/Progress Goal Weekly Team Focus  Bowel/Bladder   continent bladder, incontinent bowel, occasional I&O cath.  regain continence of bowel  assess toileting needs as needed.   Swallow/Nutrition/ Hydration             ADL's   Min/Mod sup > sit, BSC transfers Min A, wants 2nd helper for comfort, very inconsistent, pain limiting  Supervision  OOB tolerance, bed mobility, core/sitting balance, BUE ROM, ADL retraining   Mobility   CGA-min supine<>sit, STS min-mod with RW, stand pivot with RW + 2 assist for equipment and min for balance, gait in //bars with +2 assist, limited by pain  supervision overall, CGA stairs  OOB mobility, pain management   Communication             Safety/Cognition/ Behavioral Observations            Pain   7/10 pain  <3 out of 10  assess pain q 4 hr and prn   Skin   unstageable to sacru, hydro-therapy in place  No new skin breakdown. No new signs of infection  assess skin q shift and prn     Discharge Planning:  Pt had wanted to be a  short length of stay but is realizing how far he has to go to make supervision levle goals-pain is still an issues and limits him   Team Discussion: Chest x-ray looks worse but patient doesn't. Increased pain medication. Decreased insulin. He does refuse some I&O caths and bladder scans. Receiving Hydro-therapy for unstageable coccyx wound. Incision to back is clean. Still treating diabetic toe ulcer. Discharging home with spouse who works from home. Got up to [facility] for the first time. Needs to stay off of backside. Begin weaning oxygen. Patient has self-limiting behaviors.  Patient on target to meet rehab goals: yes, currently min/mod assist. Occasionally needs +2 for assist. Supervision goals.  *See Care Plan and progress notes for long and short-term goals.  Revisions to Treatment Plan:  Order low air loss mattress.  Teaching Needs: Family education, medication/pain management, skin/wound care, safety awareness, transfer training, etc.  Current Barriers to Discharge: Decreased caregiver support, Incontinence, Neurogenic bowel and bladder, Wound care, Weight, Weight bearing restrictions, Medication compliance, and New oxygen  Possible Resolutions to Barriers: Family education Order DME Follow up HH/Outpatient therapy, and nursing Oxygen set-up at house     Medical Summary Current Status: urinary retention better; no caths (but refused some caths)- incontnent of bowel- pain 7/10- doing better; stage II coccyx; and unstageable ulcer- getting hydrotherapy- on air mattress; diabetic toe  ulcer- chronic;  Barriers to Discharge: Wound care;Weight bearing restrictions;Decreased family/caregiver support;Home enviroment access/layout;Incontinence;Medical stability;Neurogenic Bowel & Bladder;Weight;New oxygen;Medication compliance  Barriers to Discharge Comments: min A right now of 2 for safety;  wiufe works from home- 24/7; Possible Resolutions to [employer]: made it to [facility] today first  time; did walk in parallel bars; wean O2; refuses caths and bladder scans; he is limiting himself and pain also does; d/c 11/28- Monday-   Continued Need for Acute Rehabilitation Level of Care: The patient requires daily medical management by a physician with specialized training in physical medicine and rehabilitation for the following reasons: Direction of a multidisciplinary physical rehabilitation program to maximize functional independence : Yes Medical management of patient stability for increased activity during participation in an intensive rehabilitation regime.: Yes Analysis of laboratory values and/or radiology reports with any subsequent need for medication adjustment and/or medical intervention. : Yes   I attest that I was present, lead the team conference, and concur with the assessment and plan of the team.   Cristi Loron 09/08/2021, 4:19 PM

## 2021-09-09 DIAGNOSIS — J9621 Acute and chronic respiratory failure with hypoxia: Secondary | ICD-10-CM

## 2021-09-09 LAB — CBC
HCT: 27 % — ABNORMAL LOW (ref 39.0–52.0)
Hemoglobin: 8.4 g/dL — ABNORMAL LOW (ref 13.0–17.0)
MCH: 28.2 pg (ref 26.0–34.0)
MCHC: 31.1 g/dL (ref 30.0–36.0)
MCV: 90.6 fL (ref 80.0–100.0)
Platelets: 269 10*3/uL (ref 150–400)
RBC: 2.98 MIL/uL — ABNORMAL LOW (ref 4.22–5.81)
RDW: 15.8 % — ABNORMAL HIGH (ref 11.5–15.5)
WBC: 6.6 10*3/uL (ref 4.0–10.5)
nRBC: 0 % (ref 0.0–0.2)

## 2021-09-09 LAB — BASIC METABOLIC PANEL
Anion gap: 9 (ref 5–15)
BUN: 15 mg/dL (ref 6–20)
CO2: 28 mmol/L (ref 22–32)
Calcium: 8.7 mg/dL — ABNORMAL LOW (ref 8.9–10.3)
Chloride: 97 mmol/L — ABNORMAL LOW (ref 98–111)
Creatinine, Ser: 1.4 mg/dL — ABNORMAL HIGH (ref 0.61–1.24)
GFR, Estimated: >60 mL/min (ref >60)
Glucose, Bld: 156 mg/dL — ABNORMAL HIGH (ref 70–99)
Potassium: 4.6 mmol/L (ref 3.5–5.1)
Sodium: 134 mmol/L — ABNORMAL LOW (ref 135–145)

## 2021-09-09 LAB — GLUCOSE, CAPILLARY
Glucose-Capillary: 70 mg/dL (ref 70–99)
Glucose-Capillary: 120 mg/dL — ABNORMAL HIGH (ref 70–99)
Glucose-Capillary: 154 mg/dL — ABNORMAL HIGH (ref 70–99)
Glucose-Capillary: 126 mg/dL — ABNORMAL HIGH (ref 70–99)

## 2021-09-09 MED ORDER — INSULIN GLARGINE-YFGN 100 UNIT/ML ~~LOC~~ SOLN
8.0000 [IU] | Freq: Every day | SUBCUTANEOUS | Status: DC
  Administered 2021-09-09: 8 [IU] via SUBCUTANEOUS
  Filled 2021-09-09 (×2): qty 0.08

## 2021-09-09 NOTE — Progress Notes (Signed)
Physical Therapy Session Note  Patient Details  Name: Jose [name] MRN: [id number] Date of Birth: 02/22/1976  Today's Date: 09/09/2021 PT Individual Time: 7542-3702 PT Individual Time Calculation (min): 25 min   Short Term Goals: Week 2:  PT Short Term Goal 1 (Week 2): Pt will tolerate sitting OOB between sessions PT Short Term Goal 2 (Week 2): Pt will ambulate x 200 ft with RW PT Short Term Goal 3 (Week 2): Pt will navigate 6" stair with LRAD  Skilled Therapeutic Interventions/Progress Updates:    Pt received supine in bed, agreeable to PT session. Pt reports pain is currently 6/10 in BLE and they feel tight, declines any medication. Pt agreeable to bed level LE stretching. Supine BLE hamstring and gastroc stretches 3 x 30 sec each. Pt exhibits more tightness in LLE as compared to RLE. Pt declines to scoot up towards [facility] despite feet being at footboard at end of session due to being in comfortable position in bed. Pt left seated in bed with needs in reach at end of session.  Therapy Documentation Precautions:  Precautions Precautions: Fall, Back Precaution Booklet Issued:  (verbally reviewed post-up back precautions) Precaution Comments: 2L oxygen, high pain levels, PICC line Restrictions Weight Bearing Restrictions: No    Therapy/Group: Individual Therapy   Excell Seltzer, PT, DPT, CSRS  09/09/2021, 4:59 PM

## 2021-09-09 NOTE — Progress Notes (Signed)
Occupational Therapy Weekly Progress Note  Patient Details  Name: Jose [name] MRN: [id number] Date of Birth: [birth year]  Beginning of progress report period: August 31, 2021 End of progress report period: September 09, 2021  Today's Date: 09/09/2021 OT Individual Time: 1100-1200 OT Individual Time Calculation (min): 60 min    Patient has met 1 of 3 short term goals.  The patient has been significantly limited by pain during this reporting period but during the past several days the pt has shown significant progress by performing BSC and wheelchair transfers with Min A with RW and attempting to increase time OOB. Pt is performing ADL tasks bed level or sitting EOB for bathing and dressing, Max/total for lower body tasks. Pt is receptive to ROM techniques and continues to carry these over outside of therapy sessions. Pt remains on supplemental O2 currently on 1.5 L down from 2L. Plan to continue to wean, increase time OOB, and improve ADL skill performance.   Patient continues to demonstrate the following deficits: muscle weakness, decreased cardiorespiratoy endurance, impaired timing and sequencing, unbalanced muscle activation, decreased coordination, and decreased motor planning, decreased motor planning, and decreased sitting balance, decreased standing balance, decreased postural control, and decreased balance strategies and therefore will continue to benefit from skilled OT intervention to enhance overall performance with BADL, iADL, and Reduce care partner burden.  Patient progressing toward long term goals..  Continue plan of care.  OT Short Term Goals Week 1:  OT Short Term Goal 1 (Week 1): Pt will tolerate sitting EOB ~10 minutes during an ADL session to demonstrate improvement in pain mgt OT Short Term Goal 1 - Progress (Week 1): Progressing toward goal OT Short Term Goal 2 (Week 1): Pt will complete 1/3 components of donning pants with AE as needed OT Short Term Goal 2 - Progress  (Week 1): Met OT Short Term Goal 3 (Week 1): Pt will complete LB dressing at sit<stand level using LRAD OT Short Term Goal 3 - Progress (Week 1): Progressing toward goal Week 2:  OT Short Term Goal 1 (Week 2): Pt will perform LB dressing bed level with Mod A and using AE OT Short Term Goal 2 (Week 2): Pt will perform BSC/toilet transfer with LRAD and CGA OT Short Term Goal 3 (Week 2): Pt will sit EOB or in wheelchair during ADL for 10 minutes while performing a task  Skilled Therapeutic Interventions/Progress Updates:    Pt greeted at time of session supine in bed resting stating he got hydro this morning and is in pain. Pt states premedicated and willing to attempt tasks. Pt rolling into side lying CGA and placed moist heat pack to upper back/trap/rhomboids for 10 minutes, skin in tact pre and post. Pt again positioned in side lying and performed STM for upper back/scapular muscles involved to decrease tightness and improve ROM for functional use. BLE warm up prior to mobility for hip/knee flexion and push outs for 1x10 each LE. Supine > sit CGA with extended time and bed features, stand pivot bed > wheelchair Min A with RW. Note O2 95% after transfer. Pt on 1.5L of O2 throughout session. Up in chair, attempted to position for comfort with various pillows. Pt agreeable to sit up in chair for 1 hour until next PT sessoin, nursing aware. Call bell in reach all needs met.   Therapy Documentation Precautions:  Precautions Precautions: Fall, Back Precaution Booklet Issued:  (verbally reviewed post-up back precautions) Precaution Comments: 2L oxygen, high pain levels, PICC line Restrictions  Weight Bearing Restrictions: No    Therapy/Group: Individual Therapy  Viona Gilmore 09/09/2021, 7:20 AM

## 2021-09-09 NOTE — Progress Notes (Signed)
Physical Therapy Session Note  Patient Details  Name: Jose [name] MRN: [id number] Date of Birth: 1976-06-22  Today's Date: 09/09/2021 PT Individual Time: 4827-0786 PT Individual Time Calculation (min): 59 min   Short Term Goals: Week 1:  PT Short Term Goal 1 (Week 1): Pt will complete bed mobility with minA and LRAD PT Short Term Goal 1 - Progress (Week 1): Met PT Short Term Goal 2 (Week 1): Pt will complete bed<>chair transfers with modA and LRAD PT Short Term Goal 2 - Progress (Week 1): Met PT Short Term Goal 3 (Week 1): Pt will tolerate sitting in recliner or TIS w/c for > 2 hours outside of therapies PT Short Term Goal 3 - Progress (Week 1): Progressing toward goal PT Short Term Goal 4 (Week 1): Pt will ambulate 82f with modA and LRAD PT Short Term Goal 4 - Progress (Week 1): Met Week 2:  PT Short Term Goal 1 (Week 2): Pt will tolerate sitting OOB between sessions PT Short Term Goal 2 (Week 2): Pt will ambulate x 200 ft with RW PT Short Term Goal 3 (Week 2): Pt will navigate 6" stair with LRAD  Skilled Therapeutic Interventions/Progress Updates:    Pt seated in TIS on arrival, hunched forward and leaning on L arm with R scapula protracted. Pt reported 7/10 pain in his shoulder blade such that he could not use RUE to eat lunch. Therapist provided manual trigger point release x 30 min with palpable, active TrPs found in Lower trap and rhomboids. Further latent TrPs found in upper trap, teres minor/major (with referral into hand) and supraspinatus. After manual therapy, pt reported pain at 5/10 and reported that it had centralized to surgical site. During treatment, therapist provided pain neuroscience education for holistic pain management. Attempted scapular ROM x 5 to improve muscle function, but pt reported increase in pain with movement. Pt then stood to RW with CGA x4. Reported stretching/pain in legs in standing but agreeable to continue. Pt performed marches x 10 to improve  discomfort in BLE. Pt directed in static split stance for hip flexor and calf stretch, as well as improving balance in functional position. Seated rest breaks between exercises. Pt then returned to bed with CGA Stand pivot transfer and supervision bed mobility. Pt initially agreeable to attempting partial sidelying to take pressure off sacral wound but declined once supine in bed. Pt remained in bed after session for rest break before sitting up again during next therapy break and was left with all needs in reach and alarm active.   Therapy Documentation Precautions:  Precautions Precautions: Fall, Back Precaution Booklet Issued:  (verbally reviewed post-up back precautions) Precaution Comments: 2L oxygen, high pain levels, PICC line Restrictions Weight Bearing Restrictions: No     Therapy/Group: Individual Therapy  OMickel Fuchs11/16/2022, 2:14 PM

## 2021-09-09 NOTE — Progress Notes (Addendum)
[facility] PROGRESS NOTE    Progress Note  Jose [name]  WCB:[id number] DOB: 28-Apr-1976 DOA: 08/30/2021 PCP: [name], NP     Brief Narrative:   Jose [name] is an 45 y.o. male past medical history of diabetes mellitus with peripheral neuropathy, chronic kidney disease stage III General anxiety disorder who was noted to have 6 weeks of chest pain radiating to the shoulders bilaterally was found to have a lytic lesion T5 T9 as well as a pathologic fracture in T6 was also found to have MSSA bacteremia discharged on 6 weeks of IV antibiotics readmitted on 08/07/2021 for worsening pain and found to have an increased paraspinal phlegmon on T5 with severe left eccentric canal stenosis, with concerns for progressive discitis and osteomyelitis was taken to the OR for T4 and T5 laminectomy with T5 ventriculostomy and decompression, was also found to have aspiration pneumonia severe anemia and GI bleed which required transfusion of packed red blood cells as well as Candida esophagitis.  He was transferred to inpatient rehab we were consulted for persistent hypoxemia but no severe related symptoms chest x-ray performed on 09/07/2021 showed new patchy bilateral infiltrates concerning for pulmonary edema versus pneumonia he was started on IV Lasix and appears fluid negative denies any sputum production cough, he has remained afebrile with no leukocytosis procalcitonin was checked and is low yield less than 0.1, CT of the chest was done that showed a persistent destructive lytic changes in T4-T5 and bilateral pleural effusion left greater than right with bilateral residual infiltrates.    Assessment/Plan:   Acute respiratory failure with hypoxia: - He has been difficult to wean, he was given IV Lasix - Mild increase in his cr,will not give any further lasix  IV Lasix. Cr will eventually come down with oral volume repletion, recheck b-met in am. No dizziness upon standing. - His hypoxia is  likely due to his bilateral pleural effusion.  This will eventually will be reabsorbed as his primary inflammatory/insults resolves. - He is not in any respiratory distress actually satting 97% on 2 L, will recommend incentive spirometry, flutter valve and try to wean to room air. - Continue to work with physical therapy and keep him out of bed to chair if possible, as much as possible.   - He had a 2D echo on 07/29/2021 that showed a preserved EF no diastolic dysfunction. - He denies any respiratory distress, no cough, no fever, procalcitonin is low yield and no leukocytosis which makes infectious etiology unlikely. - I told the nurse to try to wean to room air get him out of bed to chair. - We will go ahead and sign off.  All other medical problems per primary service. RN Pressure Injury Documentation: Pressure Injury 08/10/21 Coccyx Medial Stage 2 -  Partial thickness loss of dermis presenting as a shallow open injury with a red, pink wound bed without slough. (Active)  08/10/21 1700  Location: Coccyx  Location Orientation: Medial  Staging: Stage 2 -  Partial thickness loss of dermis presenting as a shallow open injury with a red, pink wound bed without slough.  Wound Description (Comments):   Present on Admission: Yes     Pressure Injury 08/14/21 Buttocks Bilateral Deep Tissue Pressure Injury - Purple or maroon localized area of discolored intact skin or blood-filled blister due to damage of underlying soft tissue from pressure and/or shear. DTI (Active)  08/14/21 0800  Location: Buttocks  Location Orientation: Bilateral  Staging: Deep Tissue Pressure Injury - Purple  or maroon localized area of discolored intact skin or blood-filled blister due to damage of underlying soft tissue from pressure and/or shear.  Wound Description (Comments): DTI  Present on Admission:     Estimated body mass index is 29.21 kg/m as calculated from the following:   Height as of this encounter: _0  (1.803  m).   Weight as of this encounter: 95 kg.    Code Status:     Code Status Orders  (From admission, onward)           Start     Ordered   08/30/21 1625  Full code  Continuous        08/30/21 1624           Code Status History     Date Active Date Inactive Code Status Order ID Comments User Context   08/06/2021 1537 08/30/2021 1527 Full Code [id number]  Orma Flaming, MD ED   07/22/2021 1844 08/03/2021 1712 Full Code [id number]  Terrilee Croak, MD Inpatient   05/19/2021 0418 05/21/2021 1847 Full Code [id number]  Tacey Ruiz, MD Inpatient         IV Access:   Peripheral IV   Procedures and diagnostic studies:   CT CHEST W CONTRAST  Result Date: 09/08/2021 CLINICAL DATA:  Chronic dyspnea EXAM: CT CHEST WITH CONTRAST TECHNIQUE: Multidetector CT imaging of the chest was performed during intravenous contrast administration. CONTRAST:  18m OMNIPAQUE IOHEXOL 300 MG/ML  SOLN COMPARISON:  CT from 08/06/2021, chest x-ray from the previous day. FINDINGS: Cardiovascular: Thoracic aorta and its branches show no aneurysmal dilatation or dissection. Heart is at the upper limits of normal in size. Mild coronary calcifications are seen. The pulmonary artery as visualized is within normal limits although limited by timing of the contrast bolus. Right-sided PICC line is noted in the distal superior vena cava. Mediastinum/Nodes: Thoracic inlet demonstrates heterogeneity of the thyroid stable in appearance from the prior exam. Scattered stable subcarinal and right hilar lymph nodes are seen. Small mediastinal lymph nodes are seen. These are stable in appearance from the prior exam and likely reactive in nature. The esophagus as visualized appears within normal limits. Lungs/Pleura: Bilateral pleural effusions are noted left considerably greater than right. Associated lower lobe infiltrate is seen also left greater than right. These changes are relatively stable from the prior CT examination.  Some increase in right upper lobe infiltrate is noted when compared with the prior exam. This would correspond with that seen on recent plain film examination. Upper Abdomen: Visualized upper abdomen shows no acute abnormality. Musculoskeletal: Persistent destructive changes are noted at T4-5 with continued vertebral body height loss at T5 when compared with the prior exam. Postsurgical changes are seen posteriorly consistent with the given clinical history. No acute rib abnormality is seen. Degenerative changes of the thoracic spine are noted. Postsurgical changes are noted in the soft tissues posteriorly consistent with the prior laminectomy. 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 The degree of bony loss at T5 has increased in the interval from the prior exam. Postsurgical changes are now seen. Bilateral pleural effusions left considerably greater than right which have increased in the interval from the prior exam. Bilateral lower lobe consolidation worse on the left than the right is seen with some patchy changes identified within the upper lobe on the left when compared with the prior study. This corresponds to that seen on recent plain film examination consistent with progressive pneumonia. Persistent reactive adenopathy is seen. Electronically Signed   By:  Inez Catalina M.D.   On: 09/08/2021 20:44     Medical Consultants:   None.   Subjective:    Jose [name] relates his breathing is better, no cough fever or SOB  Objective:    Vitals:   09/08/21 0559 09/08/21 1328 09/08/21 1942 09/09/21 0500  BP: (!) 146/82 (!) 141/77 (!) 160/86 135/70  Pulse: 76 73 81 66  Resp: _0 Temp: 98.3 F (36.8 C) 97.7 F (36.5 C) 97.9 F (36.6 C) 98.1 F (36.7 C)  TempSrc:    Oral  SpO2: 96% 98% 96% 97%  Weight:    95 kg  Height:       SpO2: 97 % O2 Flow Rate (L/min): 2 L/min   Intake/Output Summary (Last 24 hours) at 09/09/2021 1232 Last data filed at 09/09/2021  0806 Gross per 24 hour  Intake 1440 ml  Output 1900 ml  Net -460 ml   Filed Weights   09/07/21 0500 09/08/21 0500 09/09/21 0500  Weight: 95 kg 95 kg 95 kg    Exam: General exam: In no acute distress. Respiratory system: Good air movement and clear to auscultation. Cardiovascular system: S1 & S2 heard, RRR. No JVD,. Gastrointestinal system: Abdomen is nondistended, soft and nontender.  Extremities: No pedal edema. Skin: No rashes, lesions or ulcers Psychiatry: Judgement and insight appear normal. Mood & affect appropriate.    Data Reviewed:    Labs: Basic Metabolic Panel: Recent Labs  Lab 09/02/21 1620 09/03/21 0404 09/07/21 0335  NA 133* 134* 134*  K 3.6 3.6 4.1  CL 105 105 102  CO2 21* 23 26  GLUCOSE 217* 103* 117*  BUN 28* 24* 16  CREATININE 1.31* 1.33* 1.23  CALCIUM 7.9* 8.1* 8.5*   GFR Estimated Creatinine Clearance: 87.4 mL/min (by C-G formula based on SCr of 1.23 mg/dL). Liver Function Tests: No results for input(s): AST, ALT, ALKPHOS, BILITOT, PROT, ALBUMIN in the last 168 hours. No results for input(s): LIPASE, AMYLASE in the last 168 hours. No results for input(s): AMMONIA in the last 168 hours. Coagulation profile No results for input(s): INR, PROTIME in the last 168 hours. COVID-19 Labs  No results for input(s): DDIMER, FERRITIN, LDH, CRP in the last 72 hours.  Lab Results  Component Value Date   SARSCOV2NAA POSITIVE (A) 08/06/2021   SARSCOV2NAA NEGATIVE 07/22/2021   SARSCOV2NAA POSITIVE (A) 05/18/2021   SARSCOV2NAA NOT DETECTED 10/22/2019    CBC: Recent Labs  Lab 09/02/21 1620 09/03/21 0404 09/08/21 0248 09/09/21 0347  WBC 6.2 6.4 6.7 6.6  NEUTROABS 3.5 3.4 3.3  --   HGB 8.2* 8.1* 7.9* 8.4*  HCT 25.7* 25.8* 25.9* 27.0*  MCV 91.8 92.5 92.8 90.6  PLT 278 300 259 269   Cardiac Enzymes: No results for input(s): CKTOTAL, CKMB, CKMBINDEX, TROPONINI in the last 168 hours. BNP (last 3 results) No results for input(s): PROBNP in the last  8760 hours. CBG: Recent Labs  Lab 09/08/21 1141 09/08/21 1640 09/08/21 2109 09/09/21 0544 09/09/21 1111  GLUCAP 128* 141* 139* 70 120*   D-Dimer: No results for input(s): DDIMER in the last 72 hours. Hgb A1c: No results for input(s): HGBA1C in the last 72 hours. Lipid Profile: No results for input(s): CHOL, HDL, LDLCALC, TRIG, CHOLHDL, LDLDIRECT in the last 72 hours. Thyroid function studies: No results for input(s): TSH, T4TOTAL, T3FREE, THYROIDAB in the last 72 hours.  Invalid input(s): FREET3 Anemia work up: No results for input(s): VITAMINB12, FOLATE, FERRITIN, TIBC, IRON, RETICCTPCT in the  last 72 hours. Sepsis Labs: Recent Labs  Lab 09/02/21 1620 09/03/21 0404 09/08/21 0248 09/08/21 1255 09/09/21 0347  PROCALCITON  --   --   --  <0.10  --   WBC 6.2 6.4 6.7  --  6.6   Microbiology No results found for this or any previous visit (from the past 240 hour(s)).   Medications:    amLODipine  10 mg Oral Daily   bethanechol  10 mg Oral TID   Chlorhexidine Gluconate Cloth  6 each Topical Daily   [START ON 09/12/2021] collagenase   Topical Daily   darbepoetin (ARANESP) injection - NON-DIALYSIS  150 mcg Subcutaneous Q Mon-1800   feeding supplement  1 Container Oral TID WC   gabapentin  200 mg Oral BID   insulin aspart  0-20 Units Subcutaneous TID AC   insulin glargine-yfgn  10 Units Subcutaneous QHS   lidocaine  1 application Urethral O0B   mouth rinse  15 mL Mouth Rinse BID   metoprolol tartrate  50 mg Oral BID   mirtazapine  15 mg Oral QHS   morphine  15 mg Oral Q12H   mupirocin ointment  1 application Topical BID   nutrition supplement (JUVEN)  1 packet Oral BID BM   pantoprazole  40 mg Oral BID   polyethylene glycol  17 g Oral Daily   protein supplement  1 Scoop Oral TID WC   senna-docusate  2 tablet Oral Q0600   sodium chloride flush  10-40 mL Intracatheter Q12H   sodium hypochlorite   Topical BID   sucralfate  1 g Oral TID WC & HS   tamsulosin  0.8 mg  Oral QPC supper   Continuous Infusions:   ceFAZolin (ANCEF) IV 2 g (09/09/21 0848)      LOS: 10 days   Charlynne Cousins  [facility]  09/09/2021, 12:32 PM

## 2021-09-09 NOTE — Progress Notes (Signed)
PROGRESS NOTE   Pt reports legs sore from walking 100 ft with RW yesterday-  LBM yesterday due to sorbitol.  No breathing issues except still needs O2-   ROS:  Pt denies SOB, abd pain, CP, N/V/C/D, and vision changes   Objective:   CT CHEST W CONTRAST  Result Date: 09/08/2021 CLINICAL DATA:  Chronic dyspnea EXAM: CT CHEST WITH CONTRAST TECHNIQUE: Multidetector CT imaging of the chest was performed during intravenous contrast administration. CONTRAST:  35mL OMNIPAQUE IOHEXOL 300 MG/ML  SOLN COMPARISON:  CT from 08/06/2021, chest x-ray from the previous day. FINDINGS: Cardiovascular: Thoracic aorta and its branches show no aneurysmal dilatation or dissection. Heart is at the upper limits of normal in size. Mild coronary calcifications are seen. The pulmonary artery as visualized is within normal limits although limited by timing of the contrast bolus. Right-sided PICC line is noted in the distal superior vena cava. Mediastinum/Nodes: Thoracic inlet demonstrates heterogeneity of the thyroid stable in appearance from the prior exam. Scattered stable subcarinal and right hilar lymph nodes are seen. Small mediastinal lymph nodes are seen. These are stable in appearance from the prior exam and likely reactive in nature. The esophagus as visualized appears within normal limits. Lungs/Pleura: Bilateral pleural effusions are noted left considerably greater than right. Associated lower lobe infiltrate is seen also left greater than right. These changes are relatively stable from the prior CT examination. Some increase in right upper lobe infiltrate is noted when compared with the prior exam. This would correspond with that seen on recent plain film examination. Upper Abdomen: Visualized upper abdomen shows no acute abnormality. Musculoskeletal: Persistent destructive changes are noted at T4-5 with continued vertebral body height loss at T5 when compared  with the prior exam. Postsurgical changes are seen posteriorly consistent with the given clinical history. No acute rib abnormality is seen. Degenerative changes of the thoracic spine are noted. Postsurgical changes are noted in the soft tissues posteriorly consistent with the prior laminectomy. 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 The degree of bony loss at T5 has increased in the interval from the prior exam. Postsurgical changes are now seen. Bilateral pleural effusions left considerably greater than right which have increased in the interval from the prior exam. Bilateral lower lobe consolidation worse on the left than the right is seen with some patchy changes identified within the upper lobe on the left when compared with the prior study. This corresponds to that seen on recent plain film examination consistent with progressive pneumonia. Persistent reactive adenopathy is seen. Electronically Signed   By: Inez Catalina M.D.   On: 09/08/2021 20:44   Recent Labs    09/08/21 0248 09/09/21 0347  WBC 6.7 6.6  HGB 7.9* 8.4*  HCT 25.9* 27.0*  PLT 259 269    Recent Labs    09/07/21 0335 09/09/21 1224  NA 134* 134*  K 4.1 4.6  CL 102 97*  CO2 26 28  GLUCOSE 117* 156*  BUN 16 15  CREATININE 1.23 1.40*  CALCIUM 8.5* 8.7*     Intake/Output Summary (Last 24 hours) at 09/09/2021 1854 Last data filed at 09/09/2021 1758 Gross per 24 hour  Intake 1320 ml  Output 2600 ml  Net -1280 ml     Pressure Injury 08/10/21 Coccyx Medial Stage 2 -  Partial thickness loss of dermis presenting as a shallow open injury with a red, pink wound bed without slough. (Active)  08/10/21 1700  Location: Coccyx (gluteal crease)  Location Orientation: Medial  Staging: Stage 2 -  Partial thickness loss of dermis presenting as a shallow open injury with a red, pink wound bed without slough.  Wound Description (Comments):   Present on Admission: Yes     Pressure Injury 08/14/21 Buttocks  Bilateral Deep Tissue Pressure Injury - Purple or maroon localized area of discolored intact skin or blood-filled blister due to damage of underlying soft tissue from pressure and/or shear. DTI (Active)  08/14/21 0800  Location: Buttocks  Location Orientation: Bilateral  Staging: Deep Tissue Pressure Injury - Purple or maroon localized area of discolored intact skin or blood-filled blister due to damage of underlying soft tissue from pressure and/or shear.  Wound Description (Comments): DTI  Present on Admission:     Physical Exam: Vital Signs Blood pressure (!) 151/74, pulse 73, temperature 97.8 F (36.6 C), resp. rate 18, height 5\' 11"  (1.803 m), weight 95 kg, SpO2 97 %.         General: awake, alert, appropriate, laying supine in air bed; wearing O2 2L by [place]; NAD HENT: conjugate gaze; oropharynx moist CV: regular rate; no JVD Pulmonary: CTA B/Lexcept decreased at bases B/L  GI: soft, NT, ND, (+)BS- hypoactive Psychiatric: appropriate Neurological: Ox3 Skin- skin open/raw around unstageable pressure ulcer Neuro. Alert and oriented x 3. Normal insight and awareness. Intact Memory. Normal language and speech. Cranial nerve exam unremarkable motor strength is 5/5 in bilateral deltoid, bicep, tricep, grip, 4-/5 hip flexor, knee extensors, 3/5 ankle dorsiflexor and plantar flexor Sensory exam reduced LT and Proprioception bilateral feet MS: no cogwheeling when back to examine- just pain with ROM   Assessment/Plan: 1. Functional deficits which require 3+ hours per day of interdisciplinary therapy in a comprehensive inpatient rehab setting. Physiatrist is providing close team supervision and 24 hour management of active medical problems listed below. Physiatrist and rehab team continue to assess barriers to discharge/monitor patient progress toward functional and medical goals  Care Tool:  Bathing    Body parts bathed by patient: Right arm, Left arm, Chest, Abdomen, Front  perineal area, Buttocks, Face   Body parts bathed by helper: Right upper leg, Left upper leg, Right lower leg, Left lower leg     Bathing assist Assist Level: Maximal Assistance - Patient 24 - 49%     Upper Body Dressing/Undressing Upper body dressing   What is the patient wearing?: Pull over shirt    Upper body assist Assist Level: Total Assistance - Patient < 25%    Lower Body Dressing/Undressing Lower body dressing      What is the patient wearing?: Pants, Underwear/pull up     Lower body assist Assist for lower body dressing: Maximal Assistance - Patient 25 - 49%     Toileting Toileting Toileting Activity did not occur (Clothing management and hygiene only): N/A (no void or bm) (pt had to be cathed)  Toileting assist Assist for toileting: 2 Helpers     Transfers Chair/bed transfer  Transfers assist  Chair/bed transfer activity did not occur: Safety/medical concerns  Chair/bed transfer assist level: Minimal Assistance - Patient > 75%     Locomotion Ambulation   Ambulation assist   Ambulation activity did not occur: Safety/medical concerns  Assist  level: 2 helpers (w/c follow) Assistive device: Walker-rolling Max distance: 100 ft   Walk 10 feet activity   Assist  Walk 10 feet activity did not occur: Safety/medical concerns  Assist level: 2 helpers Assistive device: Walker-rolling   Walk 50 feet activity   Assist Walk 50 feet with 2 turns activity did not occur: Safety/medical concerns  Assist level: 2 helpers Assistive device: Walker-rolling    Walk 150 feet activity   Assist Walk 150 feet activity did not occur: Safety/medical concerns         Walk 10 feet on uneven surface  activity   Assist Walk 10 feet on uneven surfaces activity did not occur: Safety/medical concerns         Wheelchair     Assist Is the patient using a wheelchair?: Yes Type of Wheelchair: Manual Wheelchair activity did not occur: Safety/medical  concerns         Wheelchair 50 feet with 2 turns activity    Assist    Wheelchair 50 feet with 2 turns activity did not occur: Safety/medical concerns       Wheelchair 150 feet activity     Assist  Wheelchair 150 feet activity did not occur: Safety/medical concerns       Blood pressure (!) 151/74, pulse 73, temperature 97.8 F (36.6 C), resp. rate 18, height 5\' 11"  (1.803 m), weight 95 kg, SpO2 97 %.    Medical Problem List and Plan: 1.  Decline in self care and mobility  secondary to thoracic myelopathy/thoracic osteomyelitis.              -patient may  shower             -ELOS/Goals: 10-12d  Con't PT and OT_ dteam conference today- d/c 3 weeks or so since pain so limiting.   Con't PT and OT- team conference today to determine actual d/c date/pain less limiting yesterday  11/16- d/c date 11/28- con't PT and OT/CIR_ walked 100 ft RW yesterday 2.  Antithrombotics: -DVT/anticoagulation:  N/A- due to GI bleed             -antiplatelet therapy: none 3. Pain Management: Oxycodone prn. 11/8- will increase oxy to q4 hours prn- might need long acting?   11/9- will add MS Contin 15 mg BID for pain- and monitor  11/13- pain much better with seating/new low air loss mattress-  stable  11/16- pain doing better even with hydrotherapy- con't regimen 4. Mood: LCSW to follow for evaluation and support.              -antipsychotic agents: N/A 5. Neuropsych: This patient is intermittently capable of making decisions on his own behalf. 6. Skin/Wound Care: Air mattress for pressure relief measures.  7. Fluids/Electrolytes/Nutrition: Monitor I/O. Check lytes in am.  8. MSSA bacteremia: On ancef with end date 12/01 9. UGIB/melena: Has received 11 units PRBC during admission --Continue protonix BID w/ carafate ac/hs.  --Monitor for signs of bleeding.  11/9- will recheck labs today at 3pm- to make sure not dropping more since is s/p so many units of pRBCs.   11/10- Hb back up to 8.1  this AM- will con'tto monitor  11/16- Hb up to 8.4 con't to monitor 10. T2DM: Po intake poor --continue TF at nights. Continue insulin gargline 12 units at nights --monitor BS every 4 hours with SSI 11/7- BG's look great- still poor appetite, but I think thrush is making it worse- things "taste weird". Con't to monitor and regimen  11/10- BG's 88-184- labile, but usually better cotnrolled- con't regimen 11/13 semglee reduced to 10u yesterday--cbg 95 this morning. Observe today 11/15- BG's 74- 140- 1 episode of 234, but otherwise, well controlled 11/16- Decrease insulin to 8 units due to BG of 70 this AM 11. HTN: Monitor BP TID--continue Norvasc and metoprolol for control.   11/13 borderline control  systolic- no changes   48/54- BP a little elevated - think it's pain- will titrate up on metoprolol 12. ABLA: On aranesp weekly for supplement.  11/7- Hb stable at 8.4- con't to monitor  11/9- Hb today is 7.6- down from 8.4- will recheck in AM to verify- hemodilution?   11/10- back up to 8.1- will monitor  11/15- Hb 7.9- con't regimen  11/16- Hb 8.4- con't to monitor 13. Acute renal insufficiency- 11/7- improving- Cr down to 1.37 and BUN down to 37-   11/9- will recheck in AM- is stable last time   11/10- Cr 1.33- BUN 24  11/16- Cr up slightly to 1.4- push fluids 14. O2 need/acute resp failure  11/7- will wean O2 as able 15. Persistent pulmonary edema vs B/L pneumonia  11/9- will give Lasix 40 mg daily x 3 days and KCL 40 mEq x3 days as well since K+ is borderline low.   11/11- will xray again Monday and wean O2 as tolerated  11/14- ordered CXR and see if can wean O2 better  11/15- CXR looks worse- B/L pulmonary edema vs pneumonia- WBC is 6.7 no left shift- will ask IM to help with managing   11/16- pulmonary effusions- due to bedrest- asked for flutter valve and pt to sit up as much as possible. Considering unstageable wound on backside 16. Dispo  11/7- wife left note asking for "CNS w/u  and endo work up as well as some more esoteric dx's"- will need to discuss with her before order any work up.    16. Urinary retention- 11/8- will start Flomax- foley out- in/out caths- will do allergy test to see if use lidocaine jelly- if so, order.   #18 17. Pressure ulcer on sacrum/coccyx- unstageable  11/10- will call gen surgery since so painful -see if can be opened up and drained?  11/11- ordered WOC_ they are starting hydrotherapy M/W/F and wound care by nursing with santyl and saline gauze and foam dressing.   11/12- hydrotherapy went well- con't regimen  11/14- Hydrotherapy M/W/F- advised pt to STAY off butt unless eating or in therapy 18. Urinary retention  11/11- on Flomax 0.8 mg nightly- peeing some, but still needing caths- will change to q4 hours due to volumes  11/12- Tried to not cath- but was cathed for 1500cc last night- educated that he needs to be cathed for volumes >300cc.   11/13- pt told he he did NOT want foley   -increase flomax to 0.8mg  qpm (only on 0.4)   -begin urecholine trial 10mg  tid   -OOB to empty when possible   -lidocaine gel with caths  11/14- pt hasn't required cath for 24- 48 hours  11/15- hasn't had cath in 72 hours- but has refused some that were offered.   11/16- no cathing required- hasn't refused any lately.   LOS: 10 days A FACE TO FACE EVALUATION WAS PERFORMED  Jose [name] 09/09/2021, 6:54 PM

## 2021-09-09 NOTE — Progress Notes (Signed)
Physical Therapy Wound Treatment Patient Details  Name: Jose [name] MRN: [id number] Date of Birth: 02-12-1976  Today's Date: 09/09/2021 PT Individual Time: 1010-1050 PT Individual Time Calculation (min): 40 min   Subjective  Patient and Family Stated Goals: get the wound healed and do well with therapies. Date of Onset:  (unknown) Prior Treatments:  (unknown)  Pain Score: 6/10 back and wound  Wound Assessment  Pressure Injury 08/10/21 Coccyx Medial Stage 2 -  Partial thickness loss of dermis presenting as a shallow open injury with a red, pink wound bed without slough. (Active)  Wound Image    09/04/21 1318  Dressing Type Barrier Film (skin prep);Foam - Lift dressing to assess site every shift;Gauze (Comment);Moist to dry;Dakin's-soaked gauze 09/09/21 1349  Dressing Clean;Dry;Intact 09/09/21 1349  Dressing Change Frequency Twice a day 09/09/21 1349  State of Healing Eschar 09/09/21 1349  Site / Wound Assessment Bleeding;Yellow 09/09/21 1349  % Wound base Red or Granulating 0% 09/09/21 1349  % Wound base Yellow/Fibrinous Exudate 100% 09/09/21 1349  % Wound base Black/Eschar 0% 09/09/21 1349  % Wound base Other/Granulation Tissue (Comment) 0% 09/09/21 1349  Peri-wound Assessment Intact;Excoriated 09/09/21 1349  Wound Length (cm) 7 cm 09/09/21 1349  Wound Width (cm) 3.5 cm 09/09/21 1349  Wound Depth (cm) 3 cm 09/09/21 1349  Wound Surface Area (cm^2) 24.5 cm^2 09/09/21 1349  Wound Volume (cm^3) 73.5 cm^3 09/09/21 1349  Margins Unattached edges (unapproximated) 09/09/21 1349  Drainage Amount Minimal 09/09/21 1349  Drainage Description Serous;Sanguineous;Green 09/09/21 1349  Treatment Cleansed;Debridement (Selective);Hydrotherapy (Pulse lavage);Packing (Saline gauze);Other (Comment) 09/09/21 1349      Hydrotherapy Pulsed lavage therapy - wound location: sacral/coccyx Pulsed Lavage with Suction (psi): 12 psi Pulsed Lavage with Suction - Normal Saline Used: 1000 mL Pulsed Lavage  Tip: Tip with splash shield Selective Debridement Selective Debridement - Location: sacral/coccyx Selective Debridement - Tools Used: Scalpel;Forceps Selective Debridement - Tissue Removed: Thick yellow eschar covering wound base fully   Wound Assessment and Plan  Wound Therapy - Assess/Plan/Recommendations Wound Therapy - Clinical Statement: Pt can benefit from hydrotherapy for pulsed lavage to soften up the thick eschar to allow for selective debridement, Then PLS for it's benefit of decrease bacterial burden.  Started 5 days DAKIN for potential bacterial infection.. Wound Therapy - Functional Problem List: pain limiting bed mobility and OOB activity. Factors Delaying/Impairing Wound Healing: Diabetes Mellitus;Multiple medical problems Hydrotherapy Plan: Debridement;Dressing change;Pulsatile lavage with suction;Patient/family education Wound Therapy - Frequency: 3X / week Wound Therapy - Current Recommendations: PT Wound Therapy - Follow Up Recommendations: dressing changes by RN;dressing changes by family/patient  Wound Therapy Goals- Improve the function of patient's integumentary system by progressing the wound(s) through the phases of wound healing (inflammation - proliferation - remodeling) by: Decrease Necrotic Tissue to: 20% Decrease Necrotic Tissue - Progress: Progressing toward goal Increase Granulation Tissue to: 80% Increase Granulation Tissue - Progress: Progressing toward goal Improve Drainage Characteristics: Min;Serous Improve Drainage Characteristics - Progress: Progressing toward goal Goals/treatment plan/discharge plan were made with and agreed upon by patient/family: Yes Time For Goal Achievement: 7 days Wound Therapy - Potential for Goals: Good  Goals will be updated until maximal potential achieved or discharge criteria met.  Discharge criteria: when goals achieved, discharge from hospital, MD decision/surgical intervention, no progress towards goals,  refusal/missing three consecutive treatments without notification or medical reason.  GP     Jose [name] Jose [name] 09/09/2021, 1:58 PM 09/09/2021  Ginger Carne., PT Acute Rehabilitation Services [phone]  (pager) [phone]  (office)

## 2021-09-09 NOTE — Progress Notes (Signed)
Occupational Therapy Session Note  Patient Details  Name: Jose [name] MRN: [id number] Date of Birth: [birth year]  Today's Date: 09/09/2021 OT Individual Time: 6553-7482 OT Individual Time Calculation (min): 54 min    Short Term Goals: Week 1:  OT Short Term Goal 1 (Week 1): Pt will tolerate sitting EOB ~10 minutes during an ADL session to demonstrate improvement in pain mgt OT Short Term Goal 2 (Week 1): Pt will complete 1/3 components of donning pants with AE as needed OT Short Term Goal 3 (Week 1): Pt will complete LB dressing at sit<stand level using LRAD   Skilled Therapeutic Interventions/Progress Updates:    Pt greeted at time of session bed level resting, agreeable to OT session. No pain reported at rest but significant pain with mobility, pt stating pain meds not due. Rest breaks provided PRN and exercises modified. Focus of session on bed level BUE strengthening with 3# dowel for the following 2x10-12 exercises: bicep curl, chest press, modified overhead raise, FWD and backward circles all to tolerance and to promote R  shoulder ROM and decrease stiffness. Discussion throughout session regarding pt request for a shower soon, discussed method and plan for a shower transfer, shower set up, and order of showering in prep for potential shower to increase comfort and ease of process. Pt adamantly declining EOB or OOB activity, so remainder of session on BLE stretches and ROM focused on glutes, hamstrings, and calves with passive hold at end range to tolerance. Pt in bed resting call bell in reach all needs met.   Therapy Documentation Precautions:  Precautions Precautions: Fall, Back Precaution Booklet Issued:  (verbally reviewed post-up back precautions) Precaution Comments: 2L oxygen, high pain levels, PICC line Restrictions Weight Bearing Restrictions: No    Therapy/Group: Individual Therapy  Viona Gilmore 09/09/2021, 7:19 AM

## 2021-09-10 LAB — BASIC METABOLIC PANEL
Anion gap: 7 (ref 5–15)
BUN: 14 mg/dL (ref 6–20)
CO2: 28 mmol/L (ref 22–32)
Calcium: 8.4 mg/dL — ABNORMAL LOW (ref 8.9–10.3)
Chloride: 96 mmol/L — ABNORMAL LOW (ref 98–111)
Creatinine, Ser: 1.41 mg/dL — ABNORMAL HIGH (ref 0.61–1.24)
GFR, Estimated: >60 mL/min (ref >60)
Glucose, Bld: 110 mg/dL — ABNORMAL HIGH (ref 70–99)
Potassium: 4.2 mmol/L (ref 3.5–5.1)
Sodium: 131 mmol/L — ABNORMAL LOW (ref 135–145)

## 2021-09-10 LAB — GLUCOSE, CAPILLARY
Glucose-Capillary: 144 mg/dL — ABNORMAL HIGH (ref 70–99)
Glucose-Capillary: 156 mg/dL — ABNORMAL HIGH (ref 70–99)
Glucose-Capillary: 57 mg/dL — ABNORMAL LOW (ref 70–99)
Glucose-Capillary: 79 mg/dL (ref 70–99)
Glucose-Capillary: 82 mg/dL (ref 70–99)

## 2021-09-10 MED ORDER — INSULIN GLARGINE-YFGN 100 UNIT/ML ~~LOC~~ SOLN
7.0000 [IU] | Freq: Every day | SUBCUTANEOUS | Status: DC
  Administered 2021-09-10 – 2021-09-20 (×11): 7 [IU] via SUBCUTANEOUS
  Filled 2021-09-10 (×12): qty 0.07

## 2021-09-10 MED ORDER — INFLUENZA VAC SPLIT QUAD 0.5 ML IM SUSY: 0.5000 mL | PREFILLED_SYRINGE | INTRAMUSCULAR | Status: DC

## 2021-09-10 NOTE — Progress Notes (Signed)
Dressing change performed with some pink color around insertion site. No edema, drainage, tenderness, or warmth noted. Some scattered red bumps at periphery of dressing. Denies itching, burning. Site allowed to dry completely prior to placing the dressing. No there issues noted. No complaints voiced.

## 2021-09-10 NOTE — Progress Notes (Signed)
Nutrition Follow-up  DOCUMENTATION CODES:   Not applicable  INTERVENTION:  Continue Boost Breeze po TID, each supplement provides 250 kcal and 9 grams of protein   Continue Beneprotein 1 scoop po TID with meal, each scoop provides 25 kcal and 6 grams of protein.    Continue Juven BID, each packet provides 95 calories, 2.5 grams of protein.   Encourage adequate PO intake.  NUTRITION DIAGNOSIS:   Increased nutrient needs related to post-op healing as evidenced by estimated needs; ongoing  GOAL:   Patient will meet greater than or equal to 90% of their needs; progressing  MONITOR:   PO intake, Supplement acceptance, Diet advancement, Labs, Weight trends, Skin, I & O's  REASON FOR ASSESSMENT:   Consult Assessment of nutrition requirement/status  ASSESSMENT:   45 year old male with history of T2DM with neuropathy and retinopathy, CKD, Macular degeneration, GAD. Recent findings of lytic lesions T5,T6 and sternum with pathologic fracture T6 as well as MSSA bacteremia. 10/14 with worsening of pain and found to have increased in paraspinal phelegmon with new epidural phelgmon and interval T5 pathologic fracture with severe left eccentric canal stenosis and overall finding likely secondary to progressive diskitis/osteomyelitis. S/p and T5 laminectomy with left T5 pediculotomy for decompression of spinal cord. 10/17 revealing severe esophagitis with black discoloration of mucosa, large amount of retained coffee ground fluid in stomach which was suctioned as well as few non bleeding superficial duodenal ulcers without stigmata of bleeding. Repeat EGD showing improvement in esophagitis Pt limited by back pain and weakness affecting ADLs and mobility. CIR recommended due to functional decline. Cortrak out 11/5.  Meal completion has been 95-100%. Pt currently has Boost Breeze and Beneprotein ordered and has been taking them. Will additionally continue Juven order to aid in wound healing. Pt  encouraged to eat his food at meals and to consume his supplements.    Labs and medications reviewed.   Diet Order:   Diet Order             DIET DYS 3 Room service appropriate? Yes with Assist; Fluid consistency: Thin  Diet effective now                   EDUCATION NEEDS:   Not appropriate for education at this time  Skin:  Skin Assessment: Reviewed RN Assessment Skin Integrity Issues:: Unstageable DTI: buttocks Stage II: N/A Unstageable: coccyx Diabetic Ulcer: L big toe Incisions: back  Last BM:  11/15  Height:   Ht Readings from Last 1 Encounters:  08/30/21 5\' 11"  (1.803 m)    Weight:   Wt Readings from Last 1 Encounters:  09/09/21 95 kg   BMI:  Body mass index is 29.21 kg/m.  Estimated Nutritional Needs:   Kcal:  2150-2350  Protein:  110-120 grams  Fluid:  >/= 2 L/day  Corrin Parker, MS, RD, LDN RD pager number/after hours weekend pager number on Amion.

## 2021-09-10 NOTE — Progress Notes (Signed)
Occupational Therapy Session Note  Patient Details  Name: Jose [name] MRN: [id number] Date of Birth: 04-20-1976  Today's Date: 09/10/2021 OT Individual Time: 9458-5929 and 1415-1510 OT Individual Time Calculation (min): 70 min and 55 min   Short Term Goals: Week 1:  OT Short Term Goal 1 (Week 1): Pt will tolerate sitting EOB ~10 minutes during an ADL session to demonstrate improvement in pain mgt OT Short Term Goal 1 - Progress (Week 1): Progressing toward goal OT Short Term Goal 2 (Week 1): Pt will complete 1/3 components of donning pants with AE as needed OT Short Term Goal 2 - Progress (Week 1): Met OT Short Term Goal 3 (Week 1): Pt will complete LB dressing at sit<stand level using LRAD OT Short Term Goal 3 - Progress (Week 1): Progressing toward goal Week 2:  OT Short Term Goal 1 (Week 2): Pt will perform LB dressing bed level with Mod A and using AE OT Short Term Goal 2 (Week 2): Pt will perform BSC/toilet transfer with LRAD and CGA OT Short Term Goal 3 (Week 2): Pt will sit EOB or in wheelchair during ADL for 10 minutes while performing a task   Skilled Therapeutic Interventions/Progress Updates:    Session 1: Pt greeted at time of session supine in bed resting, finishing up PICC line dressing change with IV nursing team. Hand off to OT when finished, discussion regarding planning this session as pt changed mind and did not want to shower. Agreeable to OOB activity for sink level bathing. Initial part of session on BLE warm up per pt request for hamstring, quad, and calf stretches. Supine > sit Min A for trunk elevation, stand pivot bed > wheelchair Min/CGA with RW. Set up at sink and performed UB/LB bathing with assist to wash back and buttocks in standing. Pt able to wash periarea in standing as well with Min/Mod overall for bathing. UB dress with Mod A to fully get over head 2/2 shoulder limitations and Max for LB dressing brief and pants. Pt remained on O2 at 1L throughout  session with O2 sats 93% or higher with activity. Pt agreeable to sit up in chair for next PT session. Call bell in reach all needs met.    Session 2: Pt greeted at time of session in bed resting, agreeable to OT session despite fatigue. Reported pain with mobility which has been ongoing, rest breaks PRN and STM to R shoulder at point of discomfort. Bed mobility supine > sit CGA and stand pivot bed <> wheelchair same manner. Remained on 1L of O2 throughout session and switched to portable tank to leave the room. Transported to orthogym and SCIFIT on level 1 for 3 intervals of: 2:00, 1:30, and 1:00 in forward motion to improve overall shoulder ROM, decrease stiffness, and improve functional use. Therapist assist at scapula to assist with mobilization throughout. O2 checked and 94% or higher throughout session. Transported back to room and stand pivot w/c > bed CGA. Sit > supine Min A for BLE management only. Focused remainder of session on using gait belt as an assist for [facility] for BLEs at bed level when therapy not present. Pt demonstrating ability to perform single leg lifts in supine, hip abduction/adduction, and figure four stretch. Left bed level with call bell in reach all needs met.     Therapy Documentation Precautions:  Precautions Precautions: Fall, Back Precaution Booklet Issued:  (verbally reviewed post-up back precautions) Precaution Comments: 2L oxygen, high pain levels, PICC line Restrictions Weight  Bearing Restrictions: No    Therapy/Group: Individual Therapy  Viona Gilmore 09/10/2021, 7:18 AM

## 2021-09-10 NOTE — Progress Notes (Signed)
Occupational Therapy Session Note  Patient Details  Name: Jose [name] MRN: [id number] Date of Birth: [birth year]  Today's Date: 09/11/2021 OT Individual Time: 0814-4818 OT Individual Time Calculation (min): 60 min    Skilled Therapeutic Interventions/Progress Updates:    Pt greeted in bed, premedicated for anxiety and back pain. Pt visibly shaking. Guided him through deep breathing exercises with hands on belly. After ~10 reps, his shaking visibly lessened. We discussed his anxiety trigger this AM, pt reporting that he had an episode of incontinence and he felt embarrassed that staff had to assist him with hygiene. Education provided on the concept of self compassion, that his situation is temporary and that he is not alone in regards to feeling these feelings in the hospital setting. Pt verbalized understanding, states that he has used body scan to help with pain and anxiety mgt this week. He did not want to get EOB or OOB during session, requesting to complete bedlevel stretches to prep him for ambulation later. OT guided him through UB/LB stretches with education emphasis placed on diaphragmatic breathing and coordinating breath with movement. Used both the gait belt and the leg lifter for LEs, pt performing ankle pumps/rotations, straight leg raises, hip abduction/adduction, and figure 4. We played calming rain sounds during session to promote relaxation/decrease muscular tension. He remained in bed at close of session, all needs within reach.   Therapy Documentation Precautions:  Precautions Precautions: Fall, Back Precaution Booklet Issued:  (verbally reviewed post-up back precautions) Precaution Comments: 2L oxygen, high pain levels, PICC line Restrictions Weight Bearing Restrictions: No ADL: ADL Eating: Dependent (per most recent staff documentation) Grooming: Maximal assistance Where Assessed-Grooming: Edge of bed Upper Body Bathing: Supervision/safety Where Assessed-Upper Body  Bathing: Edge of bed Lower Body Bathing: Maximal assistance Where Assessed-Lower Body Bathing: Edge of bed, Bed level Upper Body Dressing: Maximal assistance Where Assessed-Upper Body Dressing: Edge of bed Lower Body Dressing: Maximal assistance, Dependent Where Assessed-Lower Body Dressing: Bed level Toileting: Not assessed Toilet Transfer: Not assessed Tub/Shower Transfer: Not assessed ADL Comments: unable to assess transfer abilities due to heightened pain, very limited tolerance sitting EOB due to pain  Therapy/Group: Individual Therapy  Marialy Urbanczyk A Crislyn Willbanks 09/11/2021, 12:10 PM

## 2021-09-10 NOTE — Progress Notes (Signed)
Physical Therapy Session Note  Patient Details  Name: Jose [name] MRN: [id number] Date of Birth: 19-Oct-1976  Today's Date: 09/10/2021 PT Individual Time: 1000-1115 PT Individual Time Calculation (min): 75 min   Short Term Goals: Week 1:  PT Short Term Goal 1 (Week 1): Pt will complete bed mobility with minA and LRAD PT Short Term Goal 1 - Progress (Week 1): Met PT Short Term Goal 2 (Week 1): Pt will complete bed<>chair transfers with modA and LRAD PT Short Term Goal 2 - Progress (Week 1): Met PT Short Term Goal 3 (Week 1): Pt will tolerate sitting in recliner or TIS w/c for > 2 hours outside of therapies PT Short Term Goal 3 - Progress (Week 1): Progressing toward goal PT Short Term Goal 4 (Week 1): Pt will ambulate 69f with modA and LRAD PT Short Term Goal 4 - Progress (Week 1): Met Week 2:  PT Short Term Goal 1 (Week 2): Pt will tolerate sitting OOB between sessions PT Short Term Goal 2 (Week 2): Pt will ambulate x 200 ft with RW PT Short Term Goal 3 (Week 2): Pt will navigate 6" stair with LRAD  Skilled Therapeutic Interventions/Progress Updates:    Pt seated in TIS on arrival, reporting extreme pain in R shoulder blade. Therapist provided triggerpoint release in rhomboids, lower trap, teres major/minor, and upper trap. Pt then agreeable to ambulation.  Maintained on 1L O2 and asymptomatic throughout. Pt ambulated x 129 ft and 225 ft with CGA, RW, and +2 for w/c follow and equipment management. Seated rest break required before second attempt. Pt demoes hunched posture and short step length, min improvement with VC. After ambulation, pt performed Stand pivot transfer to EOB with min A for equipment management. Sit>supine with CGA. Therapist the provided passive stretching to ankles, knees, hips. Discussed rhythmic rotation as tool helping muscles relax when alone in room. Pt remained supine in bed and was left with all needs in reach.   Therapy Documentation Precautions:   Precautions Precautions: Fall, Back Precaution Booklet Issued:  (verbally reviewed post-up back precautions) Precaution Comments: 2L oxygen, high pain levels, PICC line Restrictions Weight Bearing Restrictions: No    Therapy/Group: Individual Therapy  OMickel Fuchs11/17/2022, 3:37 PM

## 2021-09-10 NOTE — Progress Notes (Signed)
Hypoglycemic Event  CBG: 57   Treatment: 8 oz juice/soda  Symptoms: Pale, Sweaty, and Shaky  Follow-up CBG: Time:6:58am  CBG Result:79  Possible Reasons for Event: Unknown  Comments/MD notified:Notified ongoing nurse during shift change.     Riley Lam

## 2021-09-10 NOTE — Progress Notes (Signed)
PROGRESS NOTE   Pt asking for CT scan results- went over with him.  Did have BG of 57 this AM- didn't have snack last night, nor his Ensure that he usually drinks- LBM 2 days ago- Very sore this AM from walking with therapy.   ROS:  Pt denies SOB, abd pain, CP, N/V/C/D, and vision changes   Objective:   CT CHEST W CONTRAST  Result Date: 09/08/2021 CLINICAL DATA:  Chronic dyspnea EXAM: CT CHEST WITH CONTRAST TECHNIQUE: Multidetector CT imaging of the chest was performed during intravenous contrast administration. CONTRAST:  37mL OMNIPAQUE IOHEXOL 300 MG/ML  SOLN COMPARISON:  CT from 08/06/2021, chest x-ray from the previous day. FINDINGS: Cardiovascular: Thoracic aorta and its branches show no aneurysmal dilatation or dissection. Heart is at the upper limits of normal in size. Mild coronary calcifications are seen. The pulmonary artery as visualized is within normal limits although limited by timing of the contrast bolus. Right-sided PICC line is noted in the distal superior vena cava. Mediastinum/Nodes: Thoracic inlet demonstrates heterogeneity of the thyroid stable in appearance from the prior exam. Scattered stable subcarinal and right hilar lymph nodes are seen. Small mediastinal lymph nodes are seen. These are stable in appearance from the prior exam and likely reactive in nature. The esophagus as visualized appears within normal limits. Lungs/Pleura: Bilateral pleural effusions are noted left considerably greater than right. Associated lower lobe infiltrate is seen also left greater than right. These changes are relatively stable from the prior CT examination. Some increase in right upper lobe infiltrate is noted when compared with the prior exam. This would correspond with that seen on recent plain film examination. Upper Abdomen: Visualized upper abdomen shows no acute abnormality. Musculoskeletal: Persistent destructive changes are  noted at T4-5 with continued vertebral body height loss at T5 when compared with the prior exam. Postsurgical changes are seen posteriorly consistent with the given clinical history. No acute rib abnormality is seen. Degenerative changes of the thoracic spine are noted. Postsurgical changes are noted in the soft tissues posteriorly consistent with the prior laminectomy. 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 The degree of bony loss at T5 has increased in the interval from the prior exam. Postsurgical changes are now seen. Bilateral pleural effusions left considerably greater than right which have increased in the interval from the prior exam. Bilateral lower lobe consolidation worse on the left than the right is seen with some patchy changes identified within the upper lobe on the left when compared with the prior study. This corresponds to that seen on recent plain film examination consistent with progressive pneumonia. Persistent reactive adenopathy is seen. Electronically Signed   By: Inez Catalina M.D.   On: 09/08/2021 20:44   Recent Labs    09/08/21 0248 09/09/21 0347  WBC 6.7 6.6  HGB 7.9* 8.4*  HCT 25.9* 27.0*  PLT 259 269    Recent Labs    09/09/21 1224 09/10/21 0039  NA 134* 131*  K 4.6 4.2  CL 97* 96*  CO2 28 28  GLUCOSE 156* 110*  BUN 15 14  CREATININE 1.40* 1.41*  CALCIUM 8.7* 8.4*     Intake/Output Summary (Last 24 hours) at  09/10/2021 0828 Last data filed at 09/10/2021 0700 Gross per 24 hour  Intake 1440 ml  Output 700 ml  Net 740 ml     Pressure Injury 08/10/21 Coccyx Medial Stage 2 -  Partial thickness loss of dermis presenting as a shallow open injury with a red, pink wound bed without slough. (Active)  08/10/21 1700  Location: Coccyx (gluteal crease)  Location Orientation: Medial  Staging: Stage 2 -  Partial thickness loss of dermis presenting as a shallow open injury with a red, pink wound bed without slough.  Wound Description (Comments):    Present on Admission: Yes     Pressure Injury 08/14/21 Buttocks Bilateral Deep Tissue Pressure Injury - Purple or maroon localized area of discolored intact skin or blood-filled blister due to damage of underlying soft tissue from pressure and/or shear. DTI (Active)  08/14/21 0800  Location: Buttocks  Location Orientation: Bilateral  Staging: Deep Tissue Pressure Injury - Purple or maroon localized area of discolored intact skin or blood-filled blister due to damage of underlying soft tissue from pressure and/or shear.  Wound Description (Comments): DTI  Present on Admission:     Physical Exam: Vital Signs Blood pressure 138/78, pulse 71, temperature 98.5 F (36.9 C), temperature source Oral, resp. rate 16, height 5\' 11"  (1.803 m), weight 95 kg, SpO2 96 %.          General: awake, alert, appropriate,laying supine in bed; O2 1.5 L in place;  NAD HENT: conjugate gaze; oropharynx moist CV: regular rate; no JVD Pulmonary: CTA B/L; no W/R/R- good air movement- decreased at bases B/L  GI: soft, NT, ND, (+)BS Psychiatric: appropriate Neurological: alert Skin- skin open/raw around unstageable pressure ulcer- dressing C/D/I Neuro. Alert and oriented x 3. Normal insight and awareness. Intact Memory. Normal language and speech. Cranial nerve exam unremarkable motor strength is 5/5 in bilateral deltoid, bicep, tricep, grip, 4-/5 hip flexor, knee extensors, 3/5 ankle dorsiflexor and plantar flexor Sensory exam reduced LT and Proprioception bilateral feet MS: no cogwheeling when back to examine- just pain with ROM   Assessment/Plan: 1. Functional deficits which require 3+ hours per day of interdisciplinary therapy in a comprehensive inpatient rehab setting. Physiatrist is providing close team supervision and 24 hour management of active medical problems listed below. Physiatrist and rehab team continue to assess barriers to discharge/monitor patient progress toward functional and medical  goals  Care Tool:  Bathing    Body parts bathed by patient: Right arm, Left arm, Chest, Abdomen, Front perineal area, Buttocks, Face   Body parts bathed by helper: Right upper leg, Left upper leg, Right lower leg, Left lower leg     Bathing assist Assist Level: Maximal Assistance - Patient 24 - 49%     Upper Body Dressing/Undressing Upper body dressing   What is the patient wearing?: Pull over shirt    Upper body assist Assist Level: Total Assistance - Patient < 25%    Lower Body Dressing/Undressing Lower body dressing      What is the patient wearing?: Pants, Underwear/pull up     Lower body assist Assist for lower body dressing: Maximal Assistance - Patient 25 - 49%     Toileting Toileting Toileting Activity did not occur (Clothing management and hygiene only): N/A (no void or bm) (pt had to be cathed)  Toileting assist Assist for toileting: 2 Helpers     Transfers Chair/bed transfer  Transfers assist  Chair/bed transfer activity did not occur: Safety/medical concerns  Chair/bed transfer assist level: Minimal Assistance -  Patient > 75%     Locomotion Ambulation   Ambulation assist   Ambulation activity did not occur: Safety/medical concerns  Assist level: 2 helpers (w/c follow) Assistive device: Walker-rolling Max distance: 100 ft   Walk 10 feet activity   Assist  Walk 10 feet activity did not occur: Safety/medical concerns  Assist level: 2 helpers Assistive device: Walker-rolling   Walk 50 feet activity   Assist Walk 50 feet with 2 turns activity did not occur: Safety/medical concerns  Assist level: 2 helpers Assistive device: Walker-rolling    Walk 150 feet activity   Assist Walk 150 feet activity did not occur: Safety/medical concerns         Walk 10 feet on uneven surface  activity   Assist Walk 10 feet on uneven surfaces activity did not occur: Safety/medical concerns         Wheelchair     Assist Is the patient  using a wheelchair?: Yes Type of Wheelchair: Manual Wheelchair activity did not occur: Safety/medical concerns         Wheelchair 50 feet with 2 turns activity    Assist    Wheelchair 50 feet with 2 turns activity did not occur: Safety/medical concerns       Wheelchair 150 feet activity     Assist  Wheelchair 150 feet activity did not occur: Safety/medical concerns       Blood pressure 138/78, pulse 71, temperature 98.5 F (36.9 C), temperature source Oral, resp. rate 16, height 5\' 11"  (1.803 m), weight 95 kg, SpO2 96 %.    Medical Problem List and Plan: 1.  Decline in self care and mobility  secondary to thoracic myelopathy/thoracic osteomyelitis.              -patient may  shower             -ELOS/Goals: 10-12d  Con't PT and OT_ dteam conference today- d/c 3 weeks or so since pain so limiting.   Con't PT and OT- team conference today to determine actual d/c date/pain less limiting yesterday  D/c 11/28 -con't PT and OT- CIR- making faster gains 2.  Antithrombotics: -DVT/anticoagulation:  N/A- due to GI bleed             -antiplatelet therapy: none 3. Pain Management: Oxycodone prn. 11/8- will increase oxy to q4 hours prn- might need long acting?   11/9- will add MS Contin 15 mg BID for pain- and monitor  11/13- pain much better with seating/new low air loss mattress-  stable  11/16- pain doing better even with hydrotherapy- con't regimen 4. Mood: LCSW to follow for evaluation and support.              -antipsychotic agents: N/A 5. Neuropsych: This patient is intermittently capable of making decisions on his own behalf. 6. Skin/Wound Care: Air mattress for pressure relief measures.  7. Fluids/Electrolytes/Nutrition: Monitor I/O. Check lytes in am.  8. MSSA bacteremia: On ancef with end date 12/01 9. UGIB/melena: Has received 11 units PRBC during admission --Continue protonix BID w/ carafate ac/hs.  --Monitor for signs of bleeding.  11/9- will recheck labs  today at 3pm- to make sure not dropping more since is s/p so many units of pRBCs.   11/10- Hb back up to 8.1 this AM- will con'tto monitor  11/16- Hb up to 8.4 con't to monitor 10. T2DM: Po intake poor --continue TF at nights. Continue insulin gargline 12 units at nights --monitor BS every 4 hours with SSI  11/7- BG's look great- still poor appetite, but I think thrush is making it worse- things "taste weird". Con't to monitor and regimen 11/10- BG's 88-184- labile, but usually better cotnrolled- con't regimen 11/13 semglee reduced to 10u yesterday--cbg 95 this morning. Observe today 11/15- BG's 74- 140- 1 episode of 234, but otherwise, well controlled 11/16- Decrease insulin to 8 units due to BG of 70 this AM 11/17- didn't get snack last night- will reduce Insulin to 7 units, but make sure pt gets snack.  11. HTN: Monitor BP TID--continue Norvasc and metoprolol for control.   11/13 borderline control  systolic- no changes   98/33- BP a little elevated - think it's pain- will titrate up on metoprolol 12. ABLA: On aranesp weekly for supplement.  11/7- Hb stable at 8.4- con't to monitor  11/9- Hb today is 7.6- down from 8.4- will recheck in AM to verify- hemodilution?   11/10- back up to 8.1- will monitor  11/15- Hb 7.9- con't regimen  11/16- Hb 8.4- con't to monitor 13. Acute renal insufficiency- 11/7- improving- Cr down to 1.37 and BUN down to 37-   11/9- will recheck in AM- is stable last time   11/10- Cr 1.33- BUN 24  11/16- Cr up slightly to 1.4- push fluids 14. O2 need/acute resp failure  11/7- will wean O2 as able 15. Persistent pulmonary edema vs B/L pneumonia  11/9- will give Lasix 40 mg daily x 3 days and KCL 40 mEq x3 days as well since K+ is borderline low.   11/11- will xray again Monday and wean O2 as tolerated  11/14- ordered CXR and see if can wean O2 better  11/15- CXR looks worse- B/L pulmonary edema vs pneumonia- WBC is 6.7 no left shift- will ask IM to help with  managing   11/16- pulmonary effusions- due to bedrest- asked for flutter valve and pt to sit up as much as possible. Considering unstageable wound on backside  11/17- pt using flutter valve and ICS0 encouraged pt to use q1 hour while awake.  101. Dispo  11/7- wife left note asking for "CNS w/u and endo work up as well as some more esoteric dx's"- will need to discuss with her before order any work up.    16. Urinary retention- 11/8- will start Flomax- foley out- in/out caths- will do allergy test to see if use lidocaine jelly- if so, order.   #18 17. Pressure ulcer on sacrum/coccyx- unstageable  11/10- will call gen surgery since so painful -see if can be opened up and drained?  11/11- ordered WOC_ they are starting hydrotherapy M/W/F and wound care by nursing with santyl and saline gauze and foam dressing.   11/12- hydrotherapy went well- con't regimen  11/14- Hydrotherapy M/W/F- advised pt to STAY off butt unless eating or in therapy 18. Urinary retention  11/11- on Flomax 0.8 mg nightly- peeing some, but still needing caths- will change to q4 hours due to volumes  11/12- Tried to not cath- but was cathed for 1500cc last night- educated that he needs to be cathed for volumes >300cc.   11/13- pt told he he did NOT want foley   -increase flomax to 0.8mg  qpm (only on 0.4)   -begin urecholine trial 10mg  tid   -OOB to empty when possible   -lidocaine gel with caths  11/14- pt hasn't required cath for 24- 48 hours  11/15- hasn't had cath in 72 hours- but has refused some that were offered.   11/16- no  cathing required- hasn't refused any lately.   11/17- d/c bladder scans/PVRs except for q8 hours prn if no void  LOS: 11 days A FACE TO FACE EVALUATION WAS PERFORMED  Judianne Seiple 09/10/2021, 8:28 AM

## 2021-09-11 LAB — GLUCOSE, CAPILLARY
Glucose-Capillary: 154 mg/dL — ABNORMAL HIGH (ref 70–99)
Glucose-Capillary: 145 mg/dL — ABNORMAL HIGH (ref 70–99)
Glucose-Capillary: 119 mg/dL — ABNORMAL HIGH (ref 70–99)
Glucose-Capillary: 165 mg/dL — ABNORMAL HIGH (ref 70–99)

## 2021-09-11 MED ORDER — SORBITOL 70 % SOLN
15.0000 mL | Freq: Once | Status: DC
  Filled 2021-09-11: qty 30

## 2021-09-11 NOTE — Progress Notes (Signed)
PROGRESS NOTE   Still sore from therapy- but it feels good/accomplished from therapy.  BG's were good overnight.  Had snack.   LBM 3 days ago- we discussed options- will order 1/2 dose of Sorbitol if no BM by 5pm.   ROS:  Pt denies SOB, abd pain, CP, N/V/C/D, and vision changes    Objective:   No results found. Recent Labs    09/09/21 0347  WBC 6.6  HGB 8.4*  HCT 27.0*  PLT 269    Recent Labs    09/09/21 1224 09/10/21 0039  NA 134* 131*  K 4.6 4.2  CL 97* 96*  CO2 28 28  GLUCOSE 156* 110*  BUN 15 14  CREATININE 1.40* 1.41*  CALCIUM 8.7* 8.4*     Intake/Output Summary (Last 24 hours) at 09/11/2021 0813 Last data filed at 09/10/2021 1741 Gross per 24 hour  Intake 480 ml  Output 550 ml  Net -70 ml     Pressure Injury 08/10/21 Coccyx Medial Unstageable - Full thickness tissue loss in which the base of the injury is covered by slough (yellow, tan, gray, green or brown) and/or eschar (tan, brown or black) in the wound bed. (Active)  08/10/21 1700  Location: Coccyx (gluteal crease)  Location Orientation: Medial  Staging: Unstageable - Full thickness tissue loss in which the base of the injury is covered by slough (yellow, tan, gray, green or brown) and/or eschar (tan, brown or black) in the wound bed.  Wound Description (Comments):   Present on Admission: Yes    Physical Exam: Vital Signs Blood pressure 104/90, pulse 72, temperature 98.7 F (37.1 C), resp. rate 14, height 5\' 11"  (1.803 m), weight 95 kg, SpO2 96 %.           General: awake, alert, appropriate, NAD HENT: conjugate gaze; oropharynx moist CV: regular rate; no JVD Pulmonary: CTA B/L; no W/R/R- good air movement- decreased at bases; on O2 by [place] GI: soft, NT, ND, (+)BS- hypoactive; protuberant Psychiatric: appropriate- interactive Neurological: Ox3 Skin- skin open/raw around unstageable pressure ulcer- dressing C/D/I Neuro.  Alert and oriented x 3. Normal insight and awareness. Intact Memory. Normal language and speech. Cranial nerve exam unremarkable motor strength is 5/5 in bilateral deltoid, bicep, tricep, grip, 4-/5 hip flexor, knee extensors, 3/5 ankle dorsiflexor and plantar flexor Sensory exam reduced LT and Proprioception bilateral feet MS: no cogwheeling when back to examine- just pain with ROM   Assessment/Plan: 1. Functional deficits which require 3+ hours per day of interdisciplinary therapy in a comprehensive inpatient rehab setting. Physiatrist is providing close team supervision and 24 hour management of active medical problems listed below. Physiatrist and rehab team continue to assess barriers to discharge/monitor patient progress toward functional and medical goals  Care Tool:  Bathing    Body parts bathed by patient: Right arm, Left arm, Chest, Abdomen, Front perineal area, Face, Right upper leg, Left upper leg   Body parts bathed by helper: Buttocks, Right lower leg, Left lower leg     Bathing assist Assist Level: Moderate Assistance - Patient 50 - 74%     Upper Body Dressing/Undressing Upper body dressing   What is the patient wearing?: Pull  over shirt    Upper body assist Assist Level: Moderate Assistance - Patient 50 - 74%    Lower Body Dressing/Undressing Lower body dressing      What is the patient wearing?: Pants, Underwear/pull up     Lower body assist Assist for lower body dressing: Maximal Assistance - Patient 25 - 49%     Toileting Toileting Toileting Activity did not occur (Clothing management and hygiene only): N/A (no void or bm) (pt had to be cathed)  Toileting assist Assist for toileting: 2 Helpers     Transfers Chair/bed transfer  Transfers assist  Chair/bed transfer activity did not occur: Safety/medical concerns  Chair/bed transfer assist level: Minimal Assistance - Patient > 75%     Locomotion Ambulation   Ambulation assist   Ambulation  activity did not occur: Safety/medical concerns  Assist level: 2 helpers (w/c follow) Assistive device: Walker-rolling Max distance: 100 ft   Walk 10 feet activity   Assist  Walk 10 feet activity did not occur: Safety/medical concerns  Assist level: 2 helpers Assistive device: Walker-rolling   Walk 50 feet activity   Assist Walk 50 feet with 2 turns activity did not occur: Safety/medical concerns  Assist level: 2 helpers Assistive device: Walker-rolling    Walk 150 feet activity   Assist Walk 150 feet activity did not occur: Safety/medical concerns         Walk 10 feet on uneven surface  activity   Assist Walk 10 feet on uneven surfaces activity did not occur: Safety/medical concerns         Wheelchair     Assist Is the patient using a wheelchair?: Yes Type of Wheelchair: Manual Wheelchair activity did not occur: Safety/medical concerns         Wheelchair 50 feet with 2 turns activity    Assist    Wheelchair 50 feet with 2 turns activity did not occur: Safety/medical concerns       Wheelchair 150 feet activity     Assist  Wheelchair 150 feet activity did not occur: Safety/medical concerns       Blood pressure 104/90, pulse 72, temperature 98.7 F (37.1 C), resp. rate 14, height 5\' 11"  (1.803 m), weight 95 kg, SpO2 96 %.    Medical Problem List and Plan: 1.  Decline in self care and mobility  secondary to thoracic myelopathy/thoracic osteomyelitis.              -patient may  shower             -ELOS/Goals: 10-12d  D/c 11/28  11/18- con't PT and OT- CIR_ making good gains now that pain better controlled  2.  Antithrombotics: -DVT/anticoagulation:  N/A- due to GI bleed             -antiplatelet therapy: none 3. Pain Management: Oxycodone prn. 11/8- will increase oxy to q4 hours prn- might need long acting?   11/9- will add MS Contin 15 mg BID for pain- and monitor  11/13- pain much better with seating/new low air loss  mattress-  stable  11/16- pain doing better even with hydrotherapy- con't regimen  11/18- pain tolerable per pt- con't regimen 4. Mood: LCSW to follow for evaluation and support.              -antipsychotic agents: N/A 5. Neuropsych: This patient is intermittently capable of making decisions on his own behalf. 6. Skin/Wound Care: Air mattress for pressure relief measures.  7. Fluids/Electrolytes/Nutrition: Monitor I/O. Check lytes in am.  8. MSSA bacteremia: On ancef with end date 12/01 9. UGIB/melena: Has received 11 units PRBC during admission --Continue protonix BID w/ carafate ac/hs.  --Monitor for signs of bleeding.  11/9- will recheck labs today at 3pm- to make sure not dropping more since is s/p so many units of pRBCs.   11/10- Hb back up to 8.1 this AM- will con'tto monitor  11/16- Hb up to 8.4 con't to monitor 10. T2DM: Po intake poor --continue TF at nights. Continue insulin gargline 12 units at nights 11/18- BG's 82-156- better- con't current dose of insulin.  11. HTN: Monitor BP TID--continue Norvasc and metoprolol for control.   11/18- BP soft, controlled- con't regimen 12. ABLA: On aranesp weekly for supplement.  11/7- Hb stable at 8.4- con't to monitor  11/9- Hb today is 7.6- down from 8.4- will recheck in AM to verify- hemodilution?   11/10- back up to 8.1- will monitor  11/15- Hb 7.9- con't regimen  11/16- Hb 8.4- con't to monitor 13. Acute renal insufficiency- 11/7- improving- Cr down to 1.37 and BUN down to 37-   11/9- will recheck in AM- is stable last time   11/10- Cr 1.33- BUN 24  11/16- Cr up slightly to 1.4- push fluids  11/17- same- on 11/17- will recheck Monday.  14. O2 need/acute resp failure  11/7- will wean O2 as able 15. Persistent pulmonary edema vs B/L pneumonia  11/9- will give Lasix 40 mg daily x 3 days and KCL 40 mEq x3 days as well since K+ is borderline low.   11/11- will xray again Monday and wean O2 as tolerated  11/14- ordered CXR and see if  can wean O2 better  11/15- CXR looks worse- B/L pulmonary edema vs pneumonia- WBC is 6.7 no left shift- will ask IM to help with managing   11/16- pulmonary effusions- due to bedrest- asked for flutter valve and pt to sit up as much as possible. Considering unstageable wound on backside  11/17- pt using flutter valve and ICS0 encouraged pt to use q1 hour while awake.  34. Dispo  11/7- wife left note asking for "CNS w/u and endo work up as well as some more esoteric dx's"- will need to discuss with her before order any work up.    16. Urinary retention- 11/8- will start Flomax- foley out- in/out caths- will do allergy test to see if use lidocaine jelly- if so, order.   #18 17. Pressure ulcer on sacrum/coccyx- unstageable  11/10- will call gen surgery since so painful -see if can be opened up and drained?  11/11- ordered WOC_ they are starting hydrotherapy M/W/F and wound care by nursing with santyl and saline gauze and foam dressing.   11/12- hydrotherapy went well- con't regimen  11/14- Hydrotherapy M/W/F- advised pt to STAY off butt unless eating or in therapy 18. Urinary retention  11/17- d/c bladder scans/PVRs except for q8 hours prn if no void   LOS: 12 days A FACE TO FACE EVALUATION WAS PERFORMED  Vassie Kugel 09/11/2021, 8:13 AM

## 2021-09-11 NOTE — Progress Notes (Signed)
Physical Therapy Session Note  Patient Details  Name: Jose [name] MRN: [id number] Date of Birth: 08-Sep-1976  Today's Date: 09/11/2021 PT Individual Time: 6948-5462 PT Individual Time Calculation (min): 34 min   Short Term Goals: Week 1:  PT Short Term Goal 1 (Week 1): Pt will complete bed mobility with minA and LRAD PT Short Term Goal 1 - Progress (Week 1): Met PT Short Term Goal 2 (Week 1): Pt will complete bed<>chair transfers with modA and LRAD PT Short Term Goal 2 - Progress (Week 1): Met PT Short Term Goal 3 (Week 1): Pt will tolerate sitting in recliner or TIS w/c for > 2 hours outside of therapies PT Short Term Goal 3 - Progress (Week 1): Progressing toward goal PT Short Term Goal 4 (Week 1): Pt will ambulate 80f with modA and LRAD PT Short Term Goal 4 - Progress (Week 1): Met Week 2:  PT Short Term Goal 1 (Week 2): Pt will tolerate sitting OOB between sessions PT Short Term Goal 2 (Week 2): Pt will ambulate x 200 ft with RW PT Short Term Goal 3 (Week 2): Pt will navigate 6" stair with LRAD  Skilled Therapeutic Interventions/Progress Updates:    pt received in bed and agreeable to therapy. Reported 8/10 pain during activity but no further intervention required as pt was premedicated. Supine<>sit with CGA. Sit to stand and Stand pivot transfer with CGA to min A at times. Pt ambulated with RW and w/c follow x 400 ft and x 330 ft. [place] hunched posture, steppage gait pattern on L side, lateral hip instability BIL. VC focused on walker proximity and encouraging participation. Pt maintained on 1 L O2 throughout. Pt then returned to room and to bed in manner written above and was left with all needs in reach.    Therapy Documentation Precautions:  Precautions Precautions: Fall, Back Precaution Booklet Issued:  (verbally reviewed post-up back precautions) Precaution Comments: 2L oxygen, high pain levels, PICC line Restrictions Weight Bearing Restrictions:  No    Therapy/Group: Individual Therapy  OMickel Fuchs11/18/2022, 3:50 PM

## 2021-09-11 NOTE — Progress Notes (Signed)
Physical Therapy Session Note  Patient Details  Name: Jose [name] MRN: [id number] Date of Birth: [birth year]  Today's Date: 09/11/2021 PT Individual Time: 8295-6213 PT Individual Time Calculation (min): 70 min   Short Term Goals: Week 1:  PT Short Term Goal 1 (Week 1): Pt will complete bed mobility with minA and LRAD PT Short Term Goal 1 - Progress (Week 1): Met PT Short Term Goal 2 (Week 1): Pt will complete bed<>chair transfers with modA and LRAD PT Short Term Goal 2 - Progress (Week 1): Met PT Short Term Goal 3 (Week 1): Pt will tolerate sitting in recliner or TIS w/c for > 2 hours outside of therapies PT Short Term Goal 3 - Progress (Week 1): Progressing toward goal PT Short Term Goal 4 (Week 1): Pt will ambulate 34f with modA and LRAD PT Short Term Goal 4 - Progress (Week 1): Met Week 2:  PT Short Term Goal 1 (Week 2): Pt will tolerate sitting OOB between sessions PT Short Term Goal 2 (Week 2): Pt will ambulate x 200 ft with RW PT Short Term Goal 3 (Week 2): Pt will navigate 6" stair with LRAD  Skilled Therapeutic Interventions/Progress Updates:    Pt received in bed after hydrotherapy reporting severe pain (premedicated) but agreeable to participating. Requested stretching to prepare for movement. Therapist provided passive stretch to plantar plexors, hamstrings, and hip rotators. Rhythmic rotation provided for neuromuscular inhibition. Pt performed active DF to capitalize on ROM gains while therapist stretched CL LE. Pt was then agreeable to standing activity. Session focused on transfers and improving shoulder pain/mobility as scheduled +2 was not present. Supine<>sit with supervision and bed features. Sit to stand to RW from various seats CGA, very occ min A. Stand pivot transfer with as little as CGA and has much as min A during session. Performed stanidng marches to improve LE mobility. Trialled sitting in room chair for Sit to stand from lower seat, with CGA. Therapist  retrieved manual w/c and back support. Attempted to propel chair x 6 ft, but unable to propel further d/t R shoulder blade pain. Pt transported to therapy gym for time management and energy conservation. Attempted UE mobility with manual assist for upright posture, pt reports burning, 9/10 pain. Attempted repositioning in standing and then discontinued activity and returned to room. Pt returned to bed and was agreeable to positioning in L side lying. Pt was educated on importance of changing positions to prevent further pain. Pt remained in bed at end of session and was left with all needs in reach.   Therapy Documentation Precautions:  Precautions Precautions: Fall, Back Precaution Booklet Issued:  (verbally reviewed post-up back precautions) Precaution Comments: 2L oxygen, high pain levels, PICC line Restrictions Weight Bearing Restrictions: No General:      Therapy/Group: Individual Therapy  OMickel Fuchs11/18/2022, 10:41 AM

## 2021-09-11 NOTE — Progress Notes (Signed)
Occupational Therapy Session Note  Patient Details  Name: Jose [name] MRN: [id number] Date of Birth: 1976-10-17  Today's Date: 09/11/2021 OT Individual Time: 1430-1458 OT Individual Time Calculation (min): 28 min    Short Term Goals:  Week 2:  OT Short Term Goal 1 (Week 2): Pt will perform LB dressing bed level with Mod A and using AE OT Short Term Goal 2 (Week 2): Pt will perform BSC/toilet transfer with LRAD and CGA OT Short Term Goal 3 (Week 2): Pt will sit EOB or in wheelchair during ADL for 10 minutes while performing a task  Skilled Therapeutic Interventions/Progress Updates:    Pt received supine with lights off, calming music playing. Pt reporting he is extremely sore from previous sessions, requesting to not leave bed. Focus of session on anxiety management and R scapula pain management. Recommendations provided including calm app, journaling, and general meditation for anxiety management- pt appreciative. Moist hot pack applied to pt's R scapula. No adverse skin reaction and skin intact following 10 min of application. Gentle scapular mobilizations provided. Pt was left supine with all needs met.   Therapy Documentation Precautions:  Precautions Precautions: Fall, Back Precaution Booklet Issued:  (verbally reviewed post-up back precautions) Precaution Comments: 2L oxygen, high pain levels, PICC line Restrictions Weight Bearing Restrictions: No    Therapy/Group: Individual Therapy  Curtis Sites 09/11/2021, 6:22 AM

## 2021-09-11 NOTE — Progress Notes (Signed)
Physical Therapy Wound Treatment Patient Details  Name: Jose [name] MRN: [id number] Date of Birth: 03/20/1976  Today's Date: 09/11/2021 PT Individual Time: 1005-1043 PT Individual Time Calculation (min): 38 min   Subjective  Subjective: I'm just sore everywhere from therapy Patient and Family Stated Goals: get the wound healed and do well with therapies. Date of Onset:  (unknown) Prior Treatments:  (unknown)  Pain Score: Pain Score: 6   Wound Assessment  Pressure Injury 08/10/21 Coccyx Medial Unstageable - Full thickness tissue loss in which the base of the injury is covered by slough (yellow, tan, gray, green or brown) and/or eschar (tan, brown or black) in the wound bed. (Active)  Dressing Type Foam - Lift dressing to assess site every shift;Barrier Film (skin prep);Gauze (Comment);Moist to dry;Dakin's-soaked gauze;Silicone dressing 47/82/95 1126  Dressing Changed;Clean;Intact 09/11/21 1126  Dressing Change Frequency Daily 09/11/21 1126  State of Healing Eschar 09/11/21 1126  Site / Wound Assessment Clean;Pink;Yellow 09/11/21 1126  % Wound base Red or Granulating 10% 09/11/21 1126  % Wound base Yellow/Fibrinous Exudate 90% 09/11/21 1126  % Wound base Black/Eschar 0% 09/11/21 1126  % Wound base Other/Granulation Tissue (Comment) 0% 09/11/21 1126  Peri-wound Assessment Intact 09/11/21 1126  Wound Length (cm) 7 cm 09/09/21 1349  Wound Width (cm) 3.5 cm 09/09/21 1349  Wound Depth (cm) 3 cm 09/09/21 1349  Wound Surface Area (cm^2) 24.5 cm^2 09/09/21 1349  Wound Volume (cm^3) 73.5 cm^3 09/09/21 1349  Margins Unattached edges (unapproximated) 09/11/21 1126  Drainage Amount Minimal 09/11/21 1126  Drainage Description Serous;Other (Comment) 09/11/21 1126  Treatment Cleansed;Debridement (Selective);Hydrotherapy (Pulse lavage) 09/11/21 1126      Hydrotherapy Pulsed lavage therapy - wound location: sacral/coccyx Pulsed Lavage with Suction (psi): 12 psi Pulsed Lavage with Suction -  Normal Saline Used: 1000 mL Pulsed Lavage Tip: Tip with splash shield Selective Debridement Selective Debridement - Location: sacral/coccyx Selective Debridement - Tools Used: Scalpel;Forceps Selective Debridement - Tissue Removed: Thick yellow eschar covering wound base fully   Wound Assessment and Plan  Wound Therapy - Assess/Plan/Recommendations Wound Therapy - Clinical Statement: Pt can benefit from hydrotherapy for pulsed lavage to soften up the thick eschar to allow for selective debridement, Then PLS for it's benefit of decrease bacterial burden.  Dakin discontinued today. Wound Therapy - Functional Problem List: pain limiting bed mobility and OOB activity. Factors Delaying/Impairing Wound Healing: Diabetes Mellitus;Multiple medical problems Hydrotherapy Plan: Debridement;Dressing change;Pulsatile lavage with suction;Patient/family education Wound Therapy - Frequency: 3X / week Wound Therapy - Current Recommendations: PT Wound Therapy - Follow Up Recommendations: dressing changes by RN;dressing changes by family/patient  Wound Therapy Goals- Improve the function of patient's integumentary system by progressing the wound(s) through the phases of wound healing (inflammation - proliferation - remodeling) by: Decrease Necrotic Tissue to: 20% Decrease Necrotic Tissue - Progress: Progressing toward goal Increase Granulation Tissue to: 80% Increase Granulation Tissue - Progress: Progressing toward goal Improve Drainage Characteristics: Min;Serous Improve Drainage Characteristics - Progress: Progressing toward goal Goals/treatment plan/discharge plan were made with and agreed upon by patient/family: Yes Time For Goal Achievement: 7 days Wound Therapy - Potential for Goals: Good  Goals will be updated until maximal potential achieved or discharge criteria met.  Discharge criteria: when goals achieved, discharge from hospital, MD decision/surgical intervention, no progress towards goals,  refusal/missing three consecutive treatments without notification or medical reason.  GP     Tessie Fass Miachel Nardelli 09/11/2021, 11:31 AM 09/11/2021  Ginger Carne., PT Acute Rehabilitation Services [phone]  (pager) [phone]  (office)

## 2021-09-12 ENCOUNTER — Encounter (HOSPITAL_COMMUNITY): Payer: Self-pay | Admitting: Physical Medicine and Rehabilitation

## 2021-09-12 DIAGNOSIS — N179 Acute kidney failure, unspecified: Secondary | ICD-10-CM

## 2021-09-12 DIAGNOSIS — N189 Chronic kidney disease, unspecified: Secondary | ICD-10-CM

## 2021-09-12 DIAGNOSIS — E871 Hypo-osmolality and hyponatremia: Secondary | ICD-10-CM

## 2021-09-12 DIAGNOSIS — K5900 Constipation, unspecified: Secondary | ICD-10-CM

## 2021-09-12 DIAGNOSIS — I10 Essential (primary) hypertension: Secondary | ICD-10-CM

## 2021-09-12 DIAGNOSIS — D62 Acute posthemorrhagic anemia: Secondary | ICD-10-CM

## 2021-09-12 DIAGNOSIS — R7309 Other abnormal glucose: Secondary | ICD-10-CM

## 2021-09-12 LAB — GLUCOSE, CAPILLARY
Glucose-Capillary: 80 mg/dL (ref 70–99)
Glucose-Capillary: 152 mg/dL — ABNORMAL HIGH (ref 70–99)
Glucose-Capillary: 103 mg/dL — ABNORMAL HIGH (ref 70–99)
Glucose-Capillary: 257 mg/dL — ABNORMAL HIGH (ref 70–99)

## 2021-09-12 NOTE — Progress Notes (Incomplete)
Occupational Therapy Session Note  Patient Details  Name: Jose [name] MRN: [id number] Date of Birth: [birth year]  Today's Date: 09/12/2021 OT Individual Time:  -       Skilled Therapeutic Interventions/Progress Updates:      Therapy Documentation Precautions:  Precautions Precautions: Fall, Back Precaution Booklet Issued:  (verbally reviewed post-up back precautions) Precaution Comments: 2L oxygen, high pain levels, PICC line Restrictions Weight Bearing Restrictions: No Vital Signs: Therapy Vitals Temp: 98.7 F (37.1 C) Temp Source: Oral Pulse Rate: 84 Resp: 20 BP: (!) 156/81 Patient Position (if appropriate): Lying Oxygen Therapy SpO2: 96 % O2 Device: Nasal Cannula O2 Flow Rate (L/min): 1 L/min Pain: Pain Assessment Pain Scale: 0-10 Pain Score: 0-No pain ADL: ADL Eating: Dependent (per most recent staff documentation) Grooming: Maximal assistance Where Assessed-Grooming: Edge of bed Upper Body Bathing: Supervision/safety Where Assessed-Upper Body Bathing: Edge of bed Lower Body Bathing: Maximal assistance Where Assessed-Lower Body Bathing: Edge of bed, Bed level Upper Body Dressing: Maximal assistance Where Assessed-Upper Body Dressing: Edge of bed Lower Body Dressing: Maximal assistance, Dependent Where Assessed-Lower Body Dressing: Bed level Toileting: Not assessed Toilet Transfer: Not assessed Tub/Shower Transfer: Not assessed ADL Comments: unable to assess transfer abilities due to heightened pain, very limited tolerance sitting EOB due to pain   Therapy/Group: Individual Therapy  Davine Coba A Cassanda Walmer 09/12/2021, 7:49 PM

## 2021-09-12 NOTE — Progress Notes (Signed)
Physical Therapy Session Note  Patient Details  Name: Jose [name] MRN: [id number] Date of Birth: December 07, 1975  Today's Date: 09/12/2021 PT Individual Time: 0905-1005 PT Individual Time Calculation (min): 60 min   Short Term Goals: Week 2:  PT Short Term Goal 1 (Week 2): Pt will tolerate sitting OOB between sessions PT Short Term Goal 2 (Week 2): Pt will ambulate x 200 ft with RW PT Short Term Goal 3 (Week 2): Pt will navigate 6" stair with LRAD  Skilled Therapeutic Interventions/Progress Updates:    Pt received L sidelying in bed with NT present providing dependent assist peri-care - pt noted to be in extreme discomfort, hands shaking (pt reports this is associated with his anxiety) and face sweating. Pt agreeable to bed level therapy despite him reporting increased levels of anxiety and high pain levels due to the peri-care being near his wound. Pt states "I'm not in a good head space right now" Therapist provided therapeutic use of self and encouragement to patient on his progress thus far with therapy. Pt's mood improving and appears to be experiencing less anxiety throughout session. Received and maintained on 1L of O2 via nasal cannula throughout session. Supine in bed, pt able to don pants min assist using leg lifter to aid in LB management.   Performed the following supine exercises with B LEs:  - straight leg hamstring stretch using leg lifter to also target B UE strengthening 2x54min each - active hip/knee flexion (like heel slides but without sliding heels) targeting hip flexor and quad strengthening x5 reps each LE with pt having increased muscle soreness and difficulty with this exercise - short arc quads with bolster under knees wearing 3lb ankle weights 2x10reps each - ankle PF against level 3 theraband resistance (green) x25 reps each - ankle DF against level 3 theraband resistance (green) x10 reps each - pt noted to lack full ankle DF ROM with pt reporting this has improved  compared to initially during his hospital stay  Therapist providing cuing on proper form/technique as well as education on how these strengthening exercises with improve functional mobility.  At end of session, pt left supine in bed with bolster under knees as pt reporting this provides significant improvement in his comfort and needs in reach. Reinforced education on importance of repositioning every 27minutes for pressure relief.  Therapy Documentation Precautions:  Precautions Precautions: Fall, Back Precaution Booklet Issued:  (verbally reviewed post-up back precautions) Precaution Comments: 2L oxygen, high pain levels, PICC line Restrictions Weight Bearing Restrictions: No   Pain:  Pt in obvious pain during peri-care and vocalizes this; however, does not rate - nurse notified and present for medication administration - therapist modified session for pain management.    Therapy/Group: Individual Therapy  Tawana Scale , PT, DPT, NCS, CSRS  09/12/2021, 7:58 AM

## 2021-09-12 NOTE — Progress Notes (Signed)
PROGRESS NOTE   Subjective/complaints:  Patient seen lying in bed this morning.  He states he slept fairly overnight due to discomfort around the wound after hydrotherapy yesterday.  He notes his muscles are sore.  ROS: Denies CP, SOB, N/V/D  Objective:   No results found. No results for input(s): WBC, HGB, HCT, PLT in the last 72 hours.   Recent Labs    09/09/21 1224 09/10/21 0039  NA 134* 131*  K 4.6 4.2  CL 97* 96*  CO2 28 28  GLUCOSE 156* 110*  BUN 15 14  CREATININE 1.40* 1.41*  CALCIUM 8.7* 8.4*      Intake/Output Summary (Last 24 hours) at 09/12/2021 1016 Last data filed at 09/12/2021 0725 Gross per 24 hour  Intake 940 ml  Output 1650 ml  Net -710 ml      Pressure Injury 08/10/21 Coccyx Medial Unstageable - Full thickness tissue loss in which the base of the injury is covered by slough (yellow, tan, gray, green or brown) and/or eschar (tan, brown or black) in the wound bed. (Active)  08/10/21 1700  Location: Coccyx (gluteal crease)  Location Orientation: Medial  Staging: Unstageable - Full thickness tissue loss in which the base of the injury is covered by slough (yellow, tan, gray, green or brown) and/or eschar (tan, brown or black) in the wound bed.  Wound Description (Comments):   Present on Admission: Yes    Physical Exam: Vital Signs Blood pressure (!) 149/78, pulse 77, temperature 99.5 F (37.5 C), temperature source Oral, resp. rate 14, height 5\' 11"  (1.803 m), weight 95 kg, SpO2 92 %. Constitutional: No distress . Vital signs reviewed. HENT: Normocephalic.  Atraumatic. Eyes: EOMI. No discharge. Cardiovascular: No JVD.  RRR. Respiratory: Normal effort.  No stridor.  Bilateral clear to auscultation.  + Hector. GI: Non-distended.  BS hypoactive. Skin: Warm and dry.  Coccyx wound not examined today. Psych: Normal mood.  Normal behavior. Musc: No edema in extremities.  No tenderness in  extremities. Neuro: Alert Motor: Strength is 5/5 in bilateral deltoid, bicep, tricep, grip, 4-/5 hip flexor, knee extensors, 3/5 ankle dorsiflexor and plantar flexor, improving  Assessment/Plan: 1. Functional deficits which require 3+ hours per day of interdisciplinary therapy in a comprehensive inpatient rehab setting. Physiatrist is providing close team supervision and 24 hour management of active medical problems listed below. Physiatrist and rehab team continue to assess barriers to discharge/monitor patient progress toward functional and medical goals  Care Tool:  Bathing    Body parts bathed by patient: Right arm, Left arm, Chest, Abdomen, Front perineal area, Face, Right upper leg, Left upper leg   Body parts bathed by helper: Buttocks, Right lower leg, Left lower leg     Bathing assist Assist Level: Moderate Assistance - Patient 50 - 74%     Upper Body Dressing/Undressing Upper body dressing   What is the patient wearing?: Pull over shirt    Upper body assist Assist Level: Moderate Assistance - Patient 50 - 74%    Lower Body Dressing/Undressing Lower body dressing      What is the patient wearing?: Pants, Underwear/pull up     Lower body assist Assist for lower body  dressing: Maximal Assistance - Patient 25 - 49%     Toileting Toileting Toileting Activity did not occur Landscape architect and hygiene only): N/A (no void or bm) (pt had to be cathed)  Toileting assist Assist for toileting: 2 Helpers     Transfers Chair/bed transfer  Transfers assist  Chair/bed transfer activity did not occur: Safety/medical concerns  Chair/bed transfer assist level: Contact Guard/Touching assist     Locomotion Ambulation   Ambulation assist   Ambulation activity did not occur: Safety/medical concerns  Assist level: 2 helpers Assistive device: Walker-rolling Max distance: 400 ft   Walk 10 feet activity   Assist  Walk 10 feet activity did not occur:  Safety/medical concerns  Assist level: 2 helpers Assistive device: Walker-rolling   Walk 50 feet activity   Assist Walk 50 feet with 2 turns activity did not occur: Safety/medical concerns  Assist level: 2 helpers Assistive device: Walker-rolling    Walk 150 feet activity   Assist Walk 150 feet activity did not occur: Safety/medical concerns  Assist level: 2 helpers Assistive device: Walker-rolling    Walk 10 feet on uneven surface  activity   Assist Walk 10 feet on uneven surfaces activity did not occur: Safety/medical concerns         Wheelchair     Assist Is the patient using a wheelchair?: Yes Type of Wheelchair: Manual Wheelchair activity did not occur: Safety/medical concerns         Wheelchair 50 feet with 2 turns activity    Assist    Wheelchair 50 feet with 2 turns activity did not occur: Safety/medical concerns       Wheelchair 150 feet activity     Assist  Wheelchair 150 feet activity did not occur: Safety/medical concerns       Blood pressure (!) 149/78, pulse 77, temperature 99.5 F (37.5 C), temperature source Oral, resp. rate 14, height 5\' 11"  (1.803 m), weight 95 kg, SpO2 92 %.    Medical Problem List and Plan: 1.  Decline in self care and mobility  secondary to thoracic myelopathy/thoracic osteomyelitis.              Continue CIR 2.  Antithrombotics: -DVT/anticoagulation:  N/A- due to GI bleed             -antiplatelet therapy: none 3. Pain Management: Oxycodone prn. Increased oxy to q4 hours prn- might need long acting?   Added MS Contin 15 mg BID for pain- and monitor Controlled with meds on 11/19  4. Mood: LCSW to follow for evaluation and support.              -antipsychotic agents: N/A 5. Neuropsych: This patient is intermittently capable of making decisions on his own behalf. 6. Skin/Wound Care: Air mattress for pressure relief measures.   Hydrotherapy to wound 7. Fluids/Electrolytes/Nutrition: Monitor  I/Os 8. MSSA bacteremia: On ancef with end date 12/01 9. UGIB/melena: Has received 11 units PRBC during admission --Continue protonix BID w/ carafate ac/hs.  --Monitor for signs of bleeding. Hemoglobin 8.4 on 11/16, labs ordered for Monday 10. T2DM: Po intake poor --continue TF at nights. Continue insulin gargline 12 units at nights Slightly labile on 11/19, monitor for trend.  11. HTN: Monitor BP TID--continue Norvasc and metoprolol for control.   Mildly elevated on 1/19, monitor for trend 12. ABLA: On aranesp weekly for supplement.  See #9 13. Acute renal insufficiency- Creatinine 1.41 on 11/17, labs ordered for Monday 14.  Supplemental oxygen dependent/acute resp failure  11/7-  will wean O2 as able 15. Pulmonary edema vs B/L pneumonia  11/9- will give Lasix 40 mg daily x 3 days and KCL 40 mEq x3 days as well since K+ is borderline low.   11/15- CXR looks worse- B/L pulmonary edema vs pneumonia, hospitalist consulted  11/16- pulmonary effusions- due to bedrest- asked for flutter valve and pt to sit up as much as possible. Considering unstageable wound on backside  11/17- pt using flutter valve and ICS0 encouraged pt to use q1 hour while awake.  16. Urinary retention- 11/8-started Flomax- foley out- in/out caths  #18 17. Pressure ulcer on sacrum/coccyx- unstageable  11/11- ordered WOC_ they are starting hydrotherapy M/W/F and wound care by nursing with santyl and saline gauze and foam dressing.   11/12- hydrotherapy went well 18.  Hyponatremia  Sodium 131 on 11/17, labs ordered for Monday  LOS: 13 days A FACE TO FACE EVALUATION WAS PERFORMED  Claudia Greenley Lorie Phenix 09/12/2021, 10:16 AM

## 2021-09-13 LAB — GLUCOSE, CAPILLARY
Glucose-Capillary: 98 mg/dL (ref 70–99)
Glucose-Capillary: 153 mg/dL — ABNORMAL HIGH (ref 70–99)
Glucose-Capillary: 193 mg/dL — ABNORMAL HIGH (ref 70–99)
Glucose-Capillary: 146 mg/dL — ABNORMAL HIGH (ref 70–99)

## 2021-09-13 NOTE — Progress Notes (Signed)
Physical Therapy Session Note  Patient Details  Name: Jose [name] MRN: [id number] Date of Birth: June 21, 1976  Today's Date: 09/13/2021 PT Individual Time: 1300-1400 PT Individual Time Calculation (min): 60 min   Short Term Goals: Week 1:  PT Short Term Goal 1 (Week 1): Pt will complete bed mobility with minA and LRAD PT Short Term Goal 1 - Progress (Week 1): Met PT Short Term Goal 2 (Week 1): Pt will complete bed<>chair transfers with modA and LRAD PT Short Term Goal 2 - Progress (Week 1): Met PT Short Term Goal 3 (Week 1): Pt will tolerate sitting in recliner or TIS w/c for > 2 hours outside of therapies PT Short Term Goal 3 - Progress (Week 1): Progressing toward goal PT Short Term Goal 4 (Week 1): Pt will ambulate 52f with modA and LRAD PT Short Term Goal 4 - Progress (Week 1): Met Week 2:  PT Short Term Goal 1 (Week 2): Pt will tolerate sitting OOB between sessions PT Short Term Goal 2 (Week 2): Pt will ambulate x 200 ft with RW PT Short Term Goal 3 (Week 2): Pt will navigate 6" stair with LRAD  Skilled Therapeutic Interventions/Progress Updates:    pt received in bed and agreeable to therapy. Pt reports stable pain levels in BIL shoulder blades, premedicated. Supine<>sit with supervision and bed features. Sit to stand with CGA throughout session to RW. Stand pivot transfer with CGA and RW EOB<>w/c<>nustep.   Gait 3 x 200 ft with RW and +2 w/c follow and equipment. Pt reported tightness/cramping in BLE which improved slightly with activity. O2 sats>93 after first bout on 1L. On 2nd bout, dropped O2 from 1L to .5L, O2 dropped to 88 briefly before rising above 90 within 60 sec. Returned to 1L for rest of session.   Attempted nu step, but pt was unable to tolerate seat d/t sacral wound. Instead, pt performed  Sci fit x  4 min at  level 1. Pt then reported 7/10 in his shoulder blades. Pt returned to room and to bed in manner written above and was left with all needs in reach.    Therapy Documentation Precautions:  Precautions Precautions: Fall, Back Precaution Booklet Issued:  (verbally reviewed post-up back precautions) Precaution Comments: 2L oxygen, high pain levels, PICC line Restrictions Weight Bearing Restrictions: No General:   Vital Signs:   Pain: Pain Assessment Pain Scale: 0-10 Pain Score: 9  Mobility:   Locomotion :    Trunk/Postural Assessment :    Balance:   Exercises:   Other Treatments:      Therapy/Group: Individual Therapy  OMickel Fuchs11/20/2022, 1:54 PM

## 2021-09-14 LAB — CBC WITH DIFFERENTIAL/PLATELET
Abs Immature Granulocytes: 0.03 10*3/uL (ref 0.00–0.07)
Basophils Absolute: 0 10*3/uL (ref 0.0–0.1)
Basophils Relative: 1 %
Eosinophils Absolute: 0.5 10*3/uL (ref 0.0–0.5)
Eosinophils Relative: 5 %
HCT: 29.1 % — ABNORMAL LOW (ref 39.0–52.0)
Hemoglobin: 9 g/dL — ABNORMAL LOW (ref 13.0–17.0)
Immature Granulocytes: 0 %
Lymphocytes Relative: 19 %
Lymphs Abs: 1.7 10*3/uL (ref 0.7–4.0)
MCH: 27.9 pg (ref 26.0–34.0)
MCHC: 30.9 g/dL (ref 30.0–36.0)
MCV: 90.1 fL (ref 80.0–100.0)
Monocytes Absolute: 1.1 10*3/uL — ABNORMAL HIGH (ref 0.1–1.0)
Monocytes Relative: 13 %
Neutro Abs: 5.4 10*3/uL (ref 1.7–7.7)
Neutrophils Relative %: 62 %
Platelets: 268 10*3/uL (ref 150–400)
RBC: 3.23 MIL/uL — ABNORMAL LOW (ref 4.22–5.81)
RDW: 15.2 % (ref 11.5–15.5)
WBC: 8.7 10*3/uL (ref 4.0–10.5)
nRBC: 0 % (ref 0.0–0.2)

## 2021-09-14 LAB — BASIC METABOLIC PANEL
Anion gap: 4 — ABNORMAL LOW (ref 5–15)
BUN: 14 mg/dL (ref 6–20)
CO2: 32 mmol/L (ref 22–32)
Calcium: 8.4 mg/dL — ABNORMAL LOW (ref 8.9–10.3)
Chloride: 98 mmol/L (ref 98–111)
Creatinine, Ser: 1.23 mg/dL (ref 0.61–1.24)
GFR, Estimated: >60 mL/min (ref >60)
Glucose, Bld: 167 mg/dL — ABNORMAL HIGH (ref 70–99)
Potassium: 4.6 mmol/L (ref 3.5–5.1)
Sodium: 134 mmol/L — ABNORMAL LOW (ref 135–145)

## 2021-09-14 LAB — GLUCOSE, CAPILLARY
Glucose-Capillary: 146 mg/dL — ABNORMAL HIGH (ref 70–99)
Glucose-Capillary: 79 mg/dL (ref 70–99)
Glucose-Capillary: 173 mg/dL — ABNORMAL HIGH (ref 70–99)
Glucose-Capillary: 175 mg/dL — ABNORMAL HIGH (ref 70–99)

## 2021-09-14 MED ORDER — METHOCARBAMOL 500 MG PO TABS
500.0000 mg | ORAL_TABLET | Freq: Four times a day (QID) | ORAL | Status: DC | PRN
  Administered 2021-09-14 – 2021-09-19 (×7): 500 mg via ORAL
  Filled 2021-09-14 (×7): qty 1

## 2021-09-14 NOTE — Progress Notes (Signed)
Occupational Therapy Session Note  Patient Details  Name: Jose [name] MRN: [id number] Date of Birth: 11/06/1975  Today's Date: 09/14/2021 OT Individual Time: 1120-1200 OT Individual Time Calculation (min): 40 min    Short Term Goals: Week 2:  OT Short Term Goal 1 (Week 2): Pt will perform LB dressing bed level with Mod A and using AE OT Short Term Goal 2 (Week 2): Pt will perform BSC/toilet transfer with LRAD and CGA OT Short Term Goal 3 (Week 2): Pt will sit EOB or in wheelchair during ADL for 10 minutes while performing a task  Skilled Therapeutic Interventions/Progress Updates:    Pt supine with 7/10 c/o pain in his buttocks and R scapula. Pt agreeable to OOB/EOB gentle AROM for pain management. Discussed diabetic foot care and answered pt questions he had re orthotics. Min A to come to EOB with extra time for rest breaks d/t pain. From EOB pt stood with close (S). He was able to remain standing with the RW for UE support for about 3 min while completing gentle shoulder elevation and scapular retraction/protraction. Pt required cueing for breathing technique for reducing anxiety. Pt completed 2 sets of bicep curls seated to challenge unsupported sitting balance and BUE strengthening. Pt returned to supine in bed with min A. Pt was left supine with all needs met. Pt on 1L O2 throughout session, all VSS.   Therapy Documentation Precautions:  Precautions Precautions: Fall, Back Precaution Booklet Issued:  (verbally reviewed post-up back precautions) Precaution Comments: 2L oxygen, high pain levels, PICC line Restrictions Weight Bearing Restrictions: No   Therapy/Group: Individual Therapy   H  09/14/2021, 6:12 AM 

## 2021-09-14 NOTE — Progress Notes (Signed)
Physical Therapy Session Note  Patient Details  Name: Jose [name] MRN: [id number] Date of Birth: 02-Mar-1976  Today's Date: 09/13/2021 PT Individual Time: 1020-1100 PT Individual Time Calculation (min): 40 min  Short Term Goals: Week 2:  PT Short Term Goal 1 (Week 2): Pt will tolerate sitting OOB between sessions PT Short Term Goal 2 (Week 2): Pt will ambulate x 200 ft with RW PT Short Term Goal 3 (Week 2): Pt will navigate 6" stair with LRAD  Skilled Therapeutic Interventions/Progress Updates:     Session initiated to make up missed OT time due to staff call-out today. Patient in bed upon PT arrival. Patient alert and agreeable to PT session. Patient reported 7/10 back and B lower extremity pain with muscle spasms during session, RN made aware and provided pain medicine at end of session. PT provided repositioning, rest breaks, and distraction as pain interventions throughout session. Patient reports that when the bed alternates positioning it sets of "spasms." Discussed with RN and in agreement to trial bed on static setting to reduce patient discomfort. Educated patient on alternating between lying on his back and side-lying for pressure relief to empower patient to attain agency of managing his own pressure relief. Patient appreciative and in agreement with alternating positioning.   Patient requested to work on "spasm" management of B lower extremities and calf and hamstring stretching in preparation for walking during PT session this afternoon. Provided cues for diaphragmatic breathing and guided imagery throughout to increase patient relaxation and reduce pain, guarding, and muscle spasms during manual soft tissue mobilization of B calves, hamstrings, and quads followed by gastroc stretch 2x1 min and hamstring stretch with tension on gastroc 2x1 min. Patient tolerated interventions well and responded well to guided imagery (utilized patient selected [place]). Reported pain reduced  to 4/10 and declined scooting up in the bed due to present state of relaxation, positioning adequate at this time.   Patient in bed at end of session with breaks locked, bed alarm set, and all needs within reach.   Therapy Documentation Precautions:  Precautions Precautions: Fall, Back Precaution Booklet Issued:  (verbally reviewed post-up back precautions) Precaution Comments: 2L oxygen, high pain levels, PICC line Restrictions Weight Bearing Restrictions: No    Therapy/Group: Individual Therapy  Merissa Renwick L Leonard Hendler PT, DPT  09/14/2021, 12:03 AM

## 2021-09-14 NOTE — Progress Notes (Addendum)
Physical Therapy Discharge Summary  Patient Details  Name: Jose [name] MRN: [id number] Date of Birth: 03-14-1976  Today's Date: 09/20/2021 PT Individual Time: 8682-5749 PT Individual Time Calculation (min): 40 min    Patient has met 8 of 9 long term goals due to improved activity tolerance, improved balance, improved postural control, increased strength, decreased pain, and ability to compensate for deficits.  Patient to discharge at an ambulatory level for household mobility and wheelchair level for community distances for energy conservation with Supervision.   Patient's care partner is independent to provide the necessary physical assistance at discharge.  Pt to d/c home with his wife and 19 yo daughter to a 1 level home with 3 STE.   Reasons goals not met: Requires CGA-min A with car transfers due to seat height and for safety. Per patient, his wife is willing and able to provide this level of assist at d/c.   Recommendation:  Patient will benefit from ongoing skilled PT services in home health setting to continue to advance safe functional mobility, address ongoing impairments in LE strength, balance, endurance, and minimize fall risk.  Equipment: RW, 18"x18" light weight wheelchair with Roho cushion  Reasons for discharge: treatment goals met and discharge from hospital  Patient/family agrees with progress made and goals achieved: Yes  Skilled Therapeutic Intervention: Patient in bed in L side-lying upon PT arrival. Patient alert and agreeable to PT session. Patient reported 6/10 sacral pain during session, RN made aware. PT provided repositioning, rest breaks, and distraction as pain interventions throughout session.   Patient reporting comfort with present positioning due to elevated pain levels earlier today. Focused session on d/c planning and education. Assisted patient with making a list of supplies that he would need at home and educated on locations to obtain medical  supplies and how to obtain discounts and to ask about filing through insurance for coverage. Encouraged use of adult briefs and urinal to prevent falls going to the bathroom with urgency or at night. Patient in agreement. Discussed d/c process and education provided by nursing and PA prior to d/c.   Discussed car transfers, opting to use mid-size truck with stand pivot using RW with use of running board to scoot back after sitting in the seat for safety. PT provided simulated demonstration of technique for patient. Reviewed stair technique, ascending backwards with RW using teach-back method, and discussed w/c mobility and minimizing overuse injuries due to hx of shoulder pain with propulsion.   Reviewed equipment recommendations for RW and wheelchair with Roho cushion for energy conservation and improved sitting tolerance and skin integrity. Patient able to teach back pressure relief strategy, standing, and schedule, every 30 min when seated.   Patient without further questions or concerns. Reports that he feels functionally prepared to return home, however, has some apprehension about medical stability at home due to new found osteomyelitis. Encourage him to express these concerns to the medical team tomorrow.  Patient in bed at end of session with breaks locked and all needs within reach.   PT Discharge Precautions/Restrictions Precautions Precautions: Fall;Back Precaution Comments: PICC line, sacral wound Restrictions Weight Bearing Restrictions: No Pain Interference Pain Interference Pain Effect on Sleep: 2. Occasionally Pain Interference with Therapy Activities: 2. Occasionally Pain Interference with Day-to-Day Activities: 3. Frequently Vision/Perception  Vision - History Ability to See in Adequate Light: 0 Adequate Perception Perception: Within Functional Limits Praxis Praxis: Intact  Cognition Overall Cognitive Status: Within Functional Limits for tasks  assessed Arousal/Alertness: Awake/alert Memory: Appears intact  Awareness: Appears intact Problem Solving: Appears intact Safety/Judgment: Appears intact Sensation Sensation Light Touch: Impaired Detail Peripheral sensation comments: Stocking glove distribution of deminished sensation bilaterally, LEs>UEs Light Touch Impaired Details: Impaired RUE;Impaired RLE;Impaired LLE;Impaired LUE Proprioception: Appears Intact Additional Comments: bilateral neuropathy. Decreaed sensation distally > proximally and worse on the L > R Coordination Gross Motor Movements are Fluid and Coordinated: No Fine Motor Movements are Fluid and Coordinated: No Coordination and Movement Description: Guarded functional movements due to pain but greatly improved, bilateral hand tremors with activity Motor  Motor Motor: Abnormal postural alignment and control Motor - Discharge Observations: Global weakness and deconditioning 2/2 prolonged and complicated hospital course. Pain limiting but significantly improved  Mobility Bed Mobility Bed Mobility: Rolling Right;Rolling Left;Supine to Sit;Sit to Supine Rolling Right: Independent with assistive device Rolling Left: Independent with assistive device Supine to Sit: Supervision/Verbal cueing;Independent with assistive device Sit to Supine: Supervision/Verbal cueing;Independent with assistive device Transfers Transfers: Sit to Stand;Stand to Sit;Stand Pivot Transfers Sit to Stand: Supervision/Verbal cueing Stand to Sit: Supervision/Verbal cueing Stand Pivot Transfers: Supervision/Verbal cueing Stand Pivot Transfer Details: Verbal cues for technique;Verbal cues for safe use of DME/AE;Verbal cues for sequencing;Verbal cues for precautions/safety Transfer (Assistive device): Rolling walker Locomotion  Gait Ambulation: Yes Gait Assistance: Supervision/Verbal cueing;Contact Guard/Touching assist Gait Distance (Feet): 400 Feet (per previous PT note) Assistive device:  Rolling walker Gait Assistance Details: Verbal cues for precautions/safety;Verbal cues for safe use of DME/AE;Verbal cues for gait pattern Gait Gait: Yes Gait Pattern: Step-through pattern;Decreased stride length;Decreased hip/knee flexion - right;Decreased hip/knee flexion - left;Lateral hip instability;Decreased trunk rotation;Trunk flexed Gait velocity: decreased Stairs / Additional Locomotion Stairs: Yes Stairs Assistance: Contact Guard/Touching assist Stair Management Technique: With walker Number of Stairs: 3 Height of Stairs: 6 Wheelchair Mobility Wheelchair Mobility: Yes (limited by R shoulder pain, able to perform short household distances and dependent community level use for energy conservation)  Trunk/Postural Assessment  Cervical Assessment Cervical Assessment: Exceptions to [facility] (forward head) Thoracic Assessment Thoracic Assessment: Exceptions to [facility] (rounded shoulders, swelling Rt>Lt trunk) Lumbar Assessment Lumbar Assessment: Exceptions to [facility] (posterior pelvic tilt) Postural Control Postural Control: Deficits on evaluation (decreased/delayed)  Balance Balance Balance Assessed: Yes Static Sitting Balance Static Sitting - Balance Support: Feet supported Static Sitting - Level of Assistance: 6: Modified independent (Device/Increase time) Dynamic Sitting Balance Dynamic Sitting - Balance Support: Feet unsupported Dynamic Sitting - Level of Assistance: 6: Modified independent (Device/Increase time) Static Standing Balance Static Standing - Balance Support: During functional activity Static Standing - Level of Assistance: 5: Stand by assistance Dynamic Standing Balance Dynamic Standing - Balance Support: During functional activity;Bilateral upper extremity supported Dynamic Standing - Level of Assistance: 5: Stand by assistance Dynamic Standing - Balance Activities: Forward lean/weight shifting;Lateral lean/weight shifting;Reaching for objects Extremity Assessment   RLE Assessment RLE Assessment: Exceptions to [facility] General Strength Comments: Grossly 4/5 throughout with functional mobility LLE Assessment LLE Assessment: Exceptions to [facility] General Strength Comments: Grossly 4/5 throughout with functional mobility    Tamre Cass L Lynnann Knudsen PT, DPT  09/20/2021, 4:16 PM

## 2021-09-14 NOTE — Progress Notes (Signed)
PROGRESS NOTE   Subjective/complaints:   Pt reports is really sore, but that's expected- having formed BM's daily, but didn't think was supposed to- went over that it's the norm to have BM's daily.   Walked 225 ft with RW on Friday and thinks more over weekend.  On 0.75 L of O2 and tolerating- they continue to wean it.     ROS:  Pt denies SOB, abd pain, CP, N/V/C/D, and vision changes   Objective:   No results found. Recent Labs    09/14/21 0334  WBC 8.7  HGB 9.0*  HCT 29.1*  PLT 268    Recent Labs    09/14/21 0334  NA 134*  K 4.6  CL 98  CO2 32  GLUCOSE 167*  BUN 14  CREATININE 1.23  CALCIUM 8.4*     Intake/Output Summary (Last 24 hours) at 09/14/2021 1408 Last data filed at 09/14/2021 0700 Gross per 24 hour  Intake 600 ml  Output 1300 ml  Net -700 ml     Pressure Injury 08/10/21 Coccyx Medial Unstageable - Full thickness tissue loss in which the base of the injury is covered by slough (yellow, tan, gray, green or brown) and/or eschar (tan, brown or black) in the wound bed. (Active)  08/10/21 1700  Location: Coccyx (gluteal crease)  Location Orientation: Medial  Staging: Unstageable - Full thickness tissue loss in which the base of the injury is covered by slough (yellow, tan, gray, green or brown) and/or eschar (tan, brown or black) in the wound bed.  Wound Description (Comments):   Present on Admission: Yes    Physical Exam: Vital Signs Blood pressure (!) 155/80, pulse 81, temperature 98.1 F (36.7 C), resp. rate 17, height 5\' 11"  (1.803 m), weight 86 kg, SpO2 96 %.    General: awake, alert, appropriate, laying on L side in bed; O2 at 0.75L O2 by [employer]; NAD HENT: conjugate gaze; oropharynx moist CV: regular rate; no JVD Pulmonary: CTA B/L; no W/R/R- good air movement- on O2- sounds great GI: soft, NT, ND, (+)BS Psychiatric: appropriate; interactive Neurological: Ox3  Musc: No edema in  extremities.  No tenderness in extremities. Neuro: Alert Motor: Strength is 5/5 in bilateral deltoid, bicep, tricep, grip, 4-/5 hip flexor, knee extensors, 3/5 ankle dorsiflexor and plantar flexor, improving  Assessment/Plan: 1. Functional deficits which require 3+ hours per day of interdisciplinary therapy in a comprehensive inpatient rehab setting. Physiatrist is providing close team supervision and 24 hour management of active medical problems listed below. Physiatrist and rehab team continue to assess barriers to discharge/monitor patient progress toward functional and medical goals  Care Tool:  Bathing    Body parts bathed by patient: Right arm, Left arm, Chest, Abdomen, Front perineal area, Face, Right upper leg, Left upper leg   Body parts bathed by helper: Buttocks, Right lower leg, Left lower leg     Bathing assist Assist Level: Moderate Assistance - Patient 50 - 74%     Upper Body Dressing/Undressing Upper body dressing   What is the patient wearing?: Pull over shirt    Upper body assist Assist Level: Moderate Assistance - Patient 50 - 74%    Lower Body Dressing/Undressing  Lower body dressing      What is the patient wearing?: Pants, Underwear/pull up     Lower body assist Assist for lower body dressing: Maximal Assistance - Patient 25 - 49%     Toileting Toileting Toileting Activity did not occur (Clothing management and hygiene only): N/A (no void or bm) (pt had to be cathed)  Toileting assist Assist for toileting: Maximal Assistance - Patient 25 - 49%     Transfers Chair/bed transfer  Transfers assist  Chair/bed transfer activity did not occur: Safety/medical concerns  Chair/bed transfer assist level: Contact Guard/Touching assist     Locomotion Ambulation   Ambulation assist   Ambulation activity did not occur: Safety/medical concerns  Assist level: 2 helpers Assistive device: Walker-rolling Max distance: 400 ft   Walk 10 feet  activity   Assist  Walk 10 feet activity did not occur: Safety/medical concerns  Assist level: 2 helpers Assistive device: Walker-rolling   Walk 50 feet activity   Assist Walk 50 feet with 2 turns activity did not occur: Safety/medical concerns  Assist level: 2 helpers Assistive device: Walker-rolling    Walk 150 feet activity   Assist Walk 150 feet activity did not occur: Safety/medical concerns  Assist level: 2 helpers Assistive device: Walker-rolling    Walk 10 feet on uneven surface  activity   Assist Walk 10 feet on uneven surfaces activity did not occur: Safety/medical concerns         Wheelchair     Assist Is the patient using a wheelchair?: Yes Type of Wheelchair: Manual Wheelchair activity did not occur: Safety/medical concerns         Wheelchair 50 feet with 2 turns activity    Assist    Wheelchair 50 feet with 2 turns activity did not occur: Safety/medical concerns       Wheelchair 150 feet activity     Assist  Wheelchair 150 feet activity did not occur: Safety/medical concerns       Blood pressure (!) 155/80, pulse 81, temperature 98.1 F (36.7 C), resp. rate 17, height 5\' 11"  (1.803 m), weight 86 kg, SpO2 96 %.    Medical Problem List and Plan: 1.  Decline in self care and mobility  secondary to thoracic myelopathy/thoracic osteomyelitis.              con't PT/OT/CIR- walking much better- will d/w team conference tomorrow about d/c date.  2.  Antithrombotics: -DVT/anticoagulation:  N/A- due to GI bleed             -antiplatelet therapy: none 3. Pain Management: Oxycodone prn. Increased oxy to q4 hours prn- might need long acting?   Added MS Contin 15 mg BID for pain- and monitor  11/21- pain overall controlled- con't regimen Controlled with meds on 11/19  4. Mood: LCSW to follow for evaluation and support.              -antipsychotic agents: N/A 5. Neuropsych: This patient is intermittently capable of making  decisions on his own behalf. 6. Skin/Wound Care: Air mattress for pressure relief measures.   Hydrotherapy to wound  11/21- has 95% of slough per WOC- wound opened up- hydrotherapy with Annitta Needs is the order- WOC to folow weekly.  7. Fluids/Electrolytes/Nutrition: Monitor I/Os 8. MSSA bacteremia: On ancef with end date 12/01 9. UGIB/melena: Has received 11 units PRBC during admission --Continue protonix BID w/ carafate ac/hs.  --Monitor for signs of bleeding. Hemoglobin 8.4 on 11/16, labs ordered for Monday  11/21- Hb 9.0-  doingbetter- con't to monitor 10. T2DM: Po intake poor --continue TF at nights. Continue insulin gargline 12 units at nights Slightly labile on 11/19, monitor for trend.   11/21- BG's variable- will monitor for trend 11. HTN: Monitor BP TID--continue Norvasc and metoprolol for control.   Mildly elevated on 1/19, monitor for trend 12. ABLA: On aranesp weekly for supplement.  See #9 13. Acute renal insufficiency- Creatinine 1.41 on 11/17, labs ordered for Monday  11/21- Cr 1.23- and BUN 14- con't to monitor 14.  Supplemental oxygen dependent/acute resp failure  11/7- will wean O2 as able 15. Pulmonary edema vs B/L pneumonia  11/9- will give Lasix 40 mg daily x 3 days and KCL 40 mEq x3 days as well since K+ is borderline low.   11/15- CXR looks worse- B/L pulmonary edema vs pneumonia, hospitalist consulted  11/16- pulmonary effusions- due to bedrest- asked for flutter valve and pt to sit up as much as possible. Considering unstageable wound on backside  11/17- pt using flutter valve and ICS0 encouraged pt to use q1 hour while awake.  16. Urinary retention- 11/8-started Flomax- foley out- in/out caths  #18 17. Pressure ulcer on sacrum/coccyx- unstageable  11/11- ordered WOC_ they are starting hydrotherapy M/W/F and wound care by nursing with santyl and saline gauze and foam dressing.   11/12- hydrotherapy went well  11/21- going well- still 95% slough- con't regimen 18.   Hyponatremia  Sodium 131 on 11/17, labs ordered for Monday  11/21- Na 134- con't to monitor  LOS: 15 days A FACE TO FACE EVALUATION WAS PERFORMED  Ura Hausen 09/14/2021, 2:08 PM

## 2021-09-14 NOTE — Consult Note (Signed)
[place] Nurse wound follow up Patient receiving care in [facility] 4M06 Seen by PT today for hydrotherapy. (See new photo in media tab). The wound has opened up but still has 95% slough in the wound bed. Would recommend continuing hydrotherapy with Santyl as ordered. [place] team will continue to follow weekly.  Please re-consult if needed. Cathlean Marseilles Tamala Julian, MSN, RN, [place], Lysle Pearl, [facility] Wound Treatment Associate Pager [phone]

## 2021-09-14 NOTE — Progress Notes (Signed)
Occupational Therapy Session Note  Patient Details  Name: Jose [name] MRN: [id number] Date of Birth: Nov 01, 1975  Today's Date: 09/14/2021 OT Individual Time: 1975-8832 OT Individual Time Calculation (min): 56 min    Short Term Goals: Week 1:  OT Short Term Goal 1 (Week 1): Pt will tolerate sitting EOB ~10 minutes during an ADL session to demonstrate improvement in pain mgt OT Short Term Goal 1 - Progress (Week 1): Progressing toward goal OT Short Term Goal 2 (Week 1): Pt will complete 1/3 components of donning pants with AE as needed OT Short Term Goal 2 - Progress (Week 1): Met OT Short Term Goal 3 (Week 1): Pt will complete LB dressing at sit<stand level using LRAD OT Short Term Goal 3 - Progress (Week 1): Progressing toward goal Week 2:  OT Short Term Goal 1 (Week 2): Pt will perform LB dressing bed level with Mod A and using AE OT Short Term Goal 2 (Week 2): Pt will perform BSC/toilet transfer with LRAD and CGA OT Short Term Goal 3 (Week 2): Pt will sit EOB or in wheelchair during ADL for 10 minutes while performing a task   Skilled Therapeutic Interventions/Progress Updates:    Pt greeted at time of session in bed resting c/o 6/10 pain and premedicated. Agreeable to OOB activity prior to having hydro this am. Supine > sit Min A and ambulated bed <> bathroom with therapist assist managing O2 at .75L and IV pole with CGA for functional mobility. O2 sats dropped to 88% after mobility but quickly rebounding to 92%+ with seated rest break and PLB. CGA for toilet transfer and Mod/Max for clothing management and hygiene in standing for brief and pants management. Pt performing UB/LB bathing seated on commode briefly with therapist providing set up of wash cloths. UB dressing with gown and new pants, Mod/Max for pants 2/2 no reacher. Ambulated back to bed same manner as above. Sit > supine Mod A. Connected to wall O2 at .75L, call bell in reach all needs met.    Therapy  Documentation Precautions:  Precautions Precautions: Fall, Back Precaution Booklet Issued:  (verbally reviewed post-up back precautions) Precaution Comments: 2L oxygen, high pain levels, PICC line Restrictions Weight Bearing Restrictions: No    Therapy/Group: Individual Therapy  Viona Gilmore 09/14/2021, 7:15 AM

## 2021-09-14 NOTE — Progress Notes (Signed)
Physical Therapy Session Note  Patient Details  Name: Jose [name] MRN: [id number] Date of Birth: [birth year]  Today's Date: 09/14/2021 PT Individual Time: 4259-5638 PT Individual Time Calculation (min): 40 min   Short Term Goals: Week 2:  PT Short Term Goal 1 (Week 2): Pt will tolerate sitting OOB between sessions PT Short Term Goal 2 (Week 2): Pt will ambulate x 200 ft with RW PT Short Term Goal 3 (Week 2): Pt will navigate 6" stair with LRAD  Skilled Therapeutic Interventions/Progress Updates:    Pt received in L sidelying in bed, agreeable to PT session. Pt reports pain in his R shoulder as well as at site of buttock wound, not rated. Pt reports he has received pain medication prior to this therapy session. Pt declines any OOB mobility this PM due to pain and fatigue, agreeable to bed level exercises. Sidelying RLE clamshells x 10 reps. Sidelying to supine at Supervision level. Supine BLE strengthening therex: SKFO, bridges, heel slides, SAQ, hip abd 2 x 10 reps each. Pt reports significant fatigue following exercises, declines further intervention. Supine to L sidelying with min A. Pt left in L sidelying in bed with needs in reach at end of session. Pt missed 20 min of scheduled therapy session due to pain and fatigue.  Therapy Documentation Precautions:  Precautions Precautions: Fall, Back Precaution Booklet Issued:  (verbally reviewed post-up back precautions) Precaution Comments: 2L oxygen, high pain levels, PICC line Restrictions Weight Bearing Restrictions: No General: PT Amount of Missed Time (min): 20 Minutes PT Missed Treatment Reason: Patient fatigue;Pain     Therapy/Group: Individual Therapy   Excell Seltzer, PT, DPT, CSRS  09/14/2021, 4:46 PM

## 2021-09-14 NOTE — Progress Notes (Signed)
Physical Therapy Wound Treatment Patient Details  Name: Jose [name] MRN: [id number] Date of Birth: May 28, 1976  Today's Date: 09/14/2021 PT Individual Time: 1014-1040 PT Individual Time Calculation (min): 26 min   Subjective  Subjective: Pt reporting soreness from therapy Patient and Family Stated Goals: get the wound healed and do well with therapies. Date of Onset:  (unknown) Prior Treatments:  (unknown)  Pain Score: Pain Score: 7   Wound Assessment  Pressure Injury 08/10/21 Coccyx Medial Unstageable - Full thickness tissue loss in which the base of the injury is covered by slough (yellow, tan, gray, green or brown) and/or eschar (tan, brown or black) in the wound bed. (Active)  Wound Image   09/14/21 1657  Dressing Type Barrier Film (skin prep);Foam - Lift dressing to assess site every shift;Gauze (Comment);Moist to dry;Santyl 09/14/21 1657  Dressing Changed;Clean;Intact;Dry 09/14/21 1657  Dressing Change Frequency Twice a day 09/14/21 1657  State of Healing Eschar 09/14/21 1657  Site / Wound Assessment Yellow;Red 09/14/21 1657  % Wound base Red or Granulating 10% 09/11/21 1126  % Wound base Yellow/Fibrinous Exudate 90% 09/11/21 1126  % Wound base Black/Eschar 0% 09/11/21 1126  % Wound base Other/Granulation Tissue (Comment) 0% 09/11/21 1126  Peri-wound Assessment Intact 09/14/21 1657  Wound Length (cm) 7 cm 09/09/21 1349  Wound Width (cm) 3.5 cm 09/09/21 1349  Wound Depth (cm) 3 cm 09/09/21 1349  Wound Surface Area (cm^2) 24.5 cm^2 09/09/21 1349  Wound Volume (cm^3) 73.5 cm^3 09/09/21 1349  Margins Unattached edges (unapproximated) 09/14/21 1657  Drainage Amount Minimal 09/14/21 1657  Drainage Description Other (Comment) 09/14/21 1657  Treatment Debridement (Selective);Hydrotherapy (Pulse lavage);Packing (Saline gauze) 09/14/21 1657      Hydrotherapy Pulsed lavage therapy - wound location: sacral/coccyx Pulsed Lavage with Suction (psi): 12 psi Pulsed Lavage with  Suction - Normal Saline Used: 1000 mL Pulsed Lavage Tip: Tip with splash shield Selective Debridement Selective Debridement - Location: sacral/coccyx Selective Debridement - Tools Used: Scalpel;Forceps Selective Debridement - Tissue Removed: Thick yellow eschar covering wound base   Wound Assessment and Plan  Wound Therapy - Assess/Plan/Recommendations Wound Therapy - Clinical Statement: Wound improving slowly. Yellow slough remains adherent with minimal debridement achieved today. Pt will benefit from further selective debridement and Santyl to remove necrotic tissue. Wound Therapy - Functional Problem List: pain limiting bed mobility and OOB activity. Factors Delaying/Impairing Wound Healing: Diabetes Mellitus;Multiple medical problems Hydrotherapy Plan: Debridement;Dressing change;Pulsatile lavage with suction;Patient/family education Wound Therapy - Frequency: 3X / week Wound Therapy - Current Recommendations: PT Wound Therapy - Follow Up Recommendations: dressing changes by RN;dressing changes by family/patient  Wound Therapy Goals- Improve the function of patient's integumentary system by progressing the wound(s) through the phases of wound healing (inflammation - proliferation - remodeling) by: Decrease Necrotic Tissue to: 20% Decrease Necrotic Tissue - Progress: Progressing toward goal Increase Granulation Tissue to: 80% Increase Granulation Tissue - Progress: Progressing toward goal Improve Drainage Characteristics: Min;Serous Improve Drainage Characteristics - Progress: Progressing toward goal Goals/treatment plan/discharge plan were made with and agreed upon by patient/family: Yes Time For Goal Achievement: 7 days Wound Therapy - Potential for Goals: Good  Goals will be updated until maximal potential achieved or discharge criteria met.  Discharge criteria: when goals achieved, discharge from hospital, MD decision/surgical intervention, no progress towards goals,  refusal/missing three consecutive treatments without notification or medical reason.  GP    Wyona Almas, PT, DPT Acute Rehabilitation Services Pager [phone] Office [phone]   Deno Etienne 09/14/2021, 5:02 PM

## 2021-09-14 NOTE — Progress Notes (Signed)
Physical Therapy Session Note  Patient Details  Name: Jose [name] MRN: [id number] Date of Birth: 1975/12/31  Today's Date: 09/14/2021 PT Individual Time: 3601-6580 PT Individual Time Calculation (min): 72 min   Short Term Goals: Week 1:  PT Short Term Goal 1 (Week 1): Pt will complete bed mobility with minA and LRAD PT Short Term Goal 1 - Progress (Week 1): Met PT Short Term Goal 2 (Week 1): Pt will complete bed<>chair transfers with modA and LRAD PT Short Term Goal 2 - Progress (Week 1): Met PT Short Term Goal 3 (Week 1): Pt will tolerate sitting in recliner or TIS w/c for > 2 hours outside of therapies PT Short Term Goal 3 - Progress (Week 1): Progressing toward goal PT Short Term Goal 4 (Week 1): Pt will ambulate 34f with modA and LRAD PT Short Term Goal 4 - Progress (Week 1): Met  Skilled Therapeutic Interventions/Progress Updates:    pt received in bed and agreeable to therapy. Pt reports soreness in his shoulder blade which worsened with activity, premedicated. Supine>sit with supervision, Stand pivot transfer to w/c with CGA and RW. Therapist provided trigger point release in rhomboids, lower trap, subscap, infraspinatus to improve muscular function and reduce pain. Pt then directed in scap retraction with manual facilitation and tactile cueing to relax upper trap. By end of trial, pt was able to perform retraction without accompanying elevation. Pt transported to therapy gym for time management and energy conservation. UBE x 4 min at level 1 to further improve shoulder mobility. Pt then ambulated x 130 and x 230 ft with CGA and RW, +1 w/c follow in case of fatigue. Pt maintained on 1L of O2 during mobility. Sit>supine with supervision. Pt remained in bed with all needs in reach.  Therapy Documentation Precautions:  Precautions Precautions: Fall, Back Precaution Booklet Issued:  (verbally reviewed post-up back precautions) Precaution Comments: 2L oxygen, high pain levels, PICC  line Restrictions Weight Bearing Restrictions: No   Therapy/Group: Individual Therapy  OMickel Fuchs11/21/2022, 3:43 PM

## 2021-09-15 LAB — GLUCOSE, CAPILLARY
Glucose-Capillary: 116 mg/dL — ABNORMAL HIGH (ref 70–99)
Glucose-Capillary: 150 mg/dL — ABNORMAL HIGH (ref 70–99)
Glucose-Capillary: 140 mg/dL — ABNORMAL HIGH (ref 70–99)
Glucose-Capillary: 160 mg/dL — ABNORMAL HIGH (ref 70–99)

## 2021-09-15 NOTE — Patient Care Conference (Signed)
Inpatient RehabilitationTeam Conference and Plan of Care Update Date: 09/15/2021   Time: 11:44 AM    Patient Name: Jose [name]      Medical Record Number: [id number]  Date of Birth: [birth year] Sex: Male         Room/Bed: 4M06C/4M06C-01 Payor Info: Payor: Theme park manager / Plan: Theme park manager OTHER / Product Type: *No Product type* /    Admit Date/Time:  08/30/2021  3:23 PM  Primary Diagnosis:  Osteomyelitis of thoracic region [facility])  Hospital Problems: Principal Problem:   Osteomyelitis of thoracic region [facility]) Active Problems:   Debility   Thoracic spondylosis with myelopathy   Hyponatremia   AKI (acute kidney injury) ([place])   Acute blood loss anemia   Labile blood glucose    Expected Discharge Date: Expected Discharge Date: 09/21/21  Team Members Present: Physician leading conference: Dr. Courtney Heys Social Worker Present: Ovidio Kin, LCSW Nurse Present: Dorthula Nettles, RN PT Present: Ailene Rud, PT OT Present: Lillia Corporal, OT PPS Coordinator present : Gunnar Fusi, SLP     Current Status/Progress Goal Weekly Team Focus  Bowel/Bladder   Pt is cont of B/B. LBM 11/20  Pt remain cont of B/B  Assist pt with toileting as needed   Swallow/Nutrition/ Hydration             ADL's   Min bed mobility, walked to bathroom CGA, assist with O2 and IV, LB bathe/dress Mod/Max, UB ADL Mod A 2/2 pain. Pain in R shoulder limiting  Supervision  continue OOB tolerance, ADL retraining, sink level tasks, BUE/BLE ROM, endurance, ADL transfers   Mobility   supervision bed mobiltiy, CGA and stand pivot sts with RW, Gait up to 400 ft with RW CGA. Still limited by pain  supervision overall, CGA stairs  Stairs, pain management;   Communication             Safety/Cognition/ Behavioral Observations            Pain   Pt has pain ranges between 7 out of 10  Pt pain to be 3 or less on 10 point pain scale  Assess for pain qshift and provide PRN   Skin   Pt has a sacral Wound   No new skin breakdown  Skin care/ wound care qshift     Discharge Planning:  Pt doing better with mobility,wound on sacrum receiving hydro-therapy. Trying to find [place] to take him but insurance poor payer and has wound issues, along with IV antibiotics   Team Discussion: O2 currently at 0.5 L, discontinue. Bottom sore from hydro-therapy. Unstageable to coccyx. Pain 7/10. Wife works from home. Stair training this afternoon. Pain in shoulder. Ambulating to/from bathroom. Contact guard for ambulation. IV antibiotics until 09/24/21. Doesn't like hospital food. Patient on target to meet rehab goals: yes, supervision goals  *See Care Plan and progress notes for long and short-term goals.   Revisions to Treatment Plan:  Began hydro-therapy for unstageable coccyx wound.  Teaching Needs: Family education, medication/pain management, skin/wound care, safety awareness, transfer training, etc.  Current Barriers to Discharge: Decreased caregiver support, Home enviroment access/layout, IV antibiotics, Wound care, Lack of/limited family support, Weight, Weight bearing restrictions, Medication compliance, and Nutritional means  Possible Resolutions to Barriers: Family education Wound care education/supplies Set up outpatient wound care Follow up PT/OT Recommended DME     Medical Summary Current Status: hydrotherapy M/W/F- so many gains functionally- but hard to find insurance/H/H to to do wound care; still limited by pain in shoulder blade-  O2 0.5L- needs ot take off O2  Barriers to Discharge: Home enviroment access/layout;Decreased family/caregiver support;Medical stability;IV antibiotics;Weight bearing restrictions;Wound care;Nutrition means;Weight  Barriers to Discharge Comments: pain is biggest limiter- per nursing and therapy-going home with wife who hasn't come for training Possible Resolutions to Barriers/Weekly Focus: work on pain mgmt-  d/c 11/28- needs wound care appointment- asap- dressing  changes- biggest barriers- no H/H so far. family education needed before d/c.   Continued Need for Acute Rehabilitation Level of Care: The patient requires daily medical management by a physician with specialized training in physical medicine and rehabilitation for the following reasons: Direction of a multidisciplinary physical rehabilitation program to maximize functional independence : Yes Medical management of patient stability for increased activity during participation in an intensive rehabilitation regime.: Yes Analysis of laboratory values and/or radiology reports with any subsequent need for medication adjustment and/or medical intervention. : Yes   I attest that I was present, lead the team conference, and concur with the assessment and plan of the team.   Cristi Loron 09/15/2021, 4:16 PM

## 2021-09-15 NOTE — Progress Notes (Signed)
Physical Therapy Weekly Progress Note  Patient Details  Name: Jose [name] MRN: [id number] Date of Birth: 11/27/1975  Beginning of progress report period: September 08, 2021 End of progress report period: September 15, 2021  Today's Date: 09/15/2021 PT Individual Time: 1030-1130 PT Individual Time Calculation (min): 60 min   Patient has met 3 of 3 short term goals.    Patient continues to demonstrate the following deficits muscle weakness, decreased oxygen support, impaired timing and sequencing, abnormal tone, and decreased coordination, and decreased standing balance and decreased balance strategies and therefore will continue to benefit from skilled PT intervention to increase functional independence with mobility. Bed mobility with supervision. CGA for simple transfers. Gait up to 400 ft with w/c follow in case of fatigue. Have not been able to attempt stairs d/t high pain levels and trigger points in periscapular muscles.  Patient progressing toward long term goals..  Continue plan of care.  PT Short Term Goals Week 2:  PT Short Term Goal 1 (Week 2): Pt will tolerate sitting OOB between sessions PT Short Term Goal 1 - Progress (Week 2): Met PT Short Term Goal 2 (Week 2): Pt will ambulate x 200 ft with RW PT Short Term Goal 2 - Progress (Week 2): Met PT Short Term Goal 3 (Week 2): Pt will navigate 6" stair with LRAD PT Short Term Goal 3 - Progress (Week 2): Met Week 3:  PT Short Term Goal 1 (Week 3): =LTGs d/t d/c date  Skilled Therapeutic Interventions/Progress Updates:  Session 1: pt received in bed and agreeable to therapy. Pt reported 7/10 pain in shoulder blade and sacral wound, which improved to 5/10 with activity, premedicated. Supine<>sit with supervision. Pt remained on .5L O2 throughout session. Sit to stand with as little as supervision throughout session, Stand pivot transfer with CGA throughout. Pt initially requested bedside exercise, so started with marching for  dynamic balance and LE strength with RW and CGA. Pt then self initiated a standing calf stretch. Progressed to pre gait weight shifting forward and back, then progressing to lifting from foot to integrate heel strike pattern. Pt then ambulate in room distances to practice gait pattern. After rest break agreeable to longer walk and ambulated x 400 ft with RW, CGA, and no w/c follow. Pt returned to bed after session and remained with all needs in reach.  Session 2: pt received in bed and agreeable to therapy. Pt reports 4/10 pain at rest, up to 7/10 with activity, pre medicated. Supine>sit with supervision. Doffed O2, maintained on room air throughout. O2 dropped as low as 95% but rose quickly with cues for deep breathing. Sit to stand with CGA to RW throughout session. Gait x 150 ft with CGA to therapy gym. Pt navigated 3" stairs x8 with CGA. After rest break, performed step ups on 6" steps with B handrails and min A fading to CGA. Gait to ortho gym in the same manner. UBE 3 x 2 at level 1 for improved ROM and function in periscapular muscles. Attempted car transfer, pt was unable to lift feet into car d/t pressure on wound and having to twist thoracic spine at this time. Pt then ambulated back to room in the same manner and returned to bed with supervision. Therapist assisted pt in arranging pillows for partial sidelying and was left with all needs in reach.     Ambulation/gait training;Balance/vestibular training;Cognitive remediation/compensation;Community reintegration;Discharge planning;Disease management/prevention;DME/adaptive equipment instruction;Functional electrical stimulation;Functional mobility training;Therapeutic Exercise;Psychosocial support;Patient/family education;Pain management;Neuromuscular re-education;Skin care/wound management;Splinting/orthotics;Stair training;Therapeutic Activities;UE/LE  Strength taining/ROM;UE/LE Coordination activities;Visual/perceptual  remediation/compensation;Wheelchair propulsion/positioning   Therapy Documentation Precautions:  Precautions Precautions: Fall, Back Precaution Booklet Issued:  (verbally reviewed post-up back precautions) Precaution Comments: 2L oxygen, high pain levels, PICC line Restrictions Weight Bearing Restrictions: No General:     Therapy/Group: Individual Therapy  [name] 09/15/2021, 7:52 AM

## 2021-09-15 NOTE — Progress Notes (Signed)
Occupational Therapy Session Note  Patient Details  Name: Jose [name] MRN: [id number] Date of Birth: 1976/03/16  Today's Date: 09/15/2021 OT Individual Time: 3968-8648 OT Individual Time Calculation (min): 59 min    Short Term Goals: Week 1:  OT Short Term Goal 1 (Week 1): Pt will tolerate sitting EOB ~10 minutes during an ADL session to demonstrate improvement in pain mgt OT Short Term Goal 1 - Progress (Week 1): Progressing toward goal OT Short Term Goal 2 (Week 1): Pt will complete 1/3 components of donning pants with AE as needed OT Short Term Goal 2 - Progress (Week 1): Met OT Short Term Goal 3 (Week 1): Pt will complete LB dressing at sit<stand level using LRAD OT Short Term Goal 3 - Progress (Week 1): Progressing toward goal Week 2:  OT Short Term Goal 1 (Week 2): Pt will perform LB dressing bed level with Mod A and using AE OT Short Term Goal 2 (Week 2): Pt will perform BSC/toilet transfer with LRAD and CGA OT Short Term Goal 3 (Week 2): Pt will sit EOB or in wheelchair during ADL for 10 minutes while performing a task   Skilled Therapeutic Interventions/Progress Updates:    Pt greeted at time of session supine in bed resting, agreeable to OT session and reported pain with mobility, did not provide #. At beginning of session, retrieved items needed for session including TEDS, scrub pants, and new gown. Bed mobility performed with CGA and extended time to sit EOB. Ambulated bed > bathroom > chair with CGA and therapist assist with managing IV pole and O2 tank. O2 sats 93% or higher throughout session. Toilet transfer with CGA/Min for controlling descent and standing from surface. Able to assist with hygiene in standing but eventually needing assist posteriorly to thoroughly clean. Changed pants sitting on commode with assist to thread, pt pulling over hips in standing with mod A overall for pants. Ambulated > wheelchair in room and set up with alarm on call bell in reach.    Therapy Documentation Precautions:  Precautions Precautions: Fall, Back Precaution Booklet Issued:  (verbally reviewed post-up back precautions) Precaution Comments: 2L oxygen, high pain levels, PICC line Restrictions Weight Bearing Restrictions: No     Therapy/Group: Individual Therapy  Viona Gilmore 09/15/2021, 7:16 AM

## 2021-09-15 NOTE — Progress Notes (Signed)
PROGRESS NOTE   Subjective/complaints:   Pt reports in good shape- pain going well; but just sore.  Pillows help after tenderness from hydrotherapy.  Laying on L side a lot-  BM daily and peeing OK.  Loves leg lifter.   ROS:   Pt denies SOB, abd pain, CP, N/V/C/D, and vision changes  Objective:   No results found. Recent Labs    09/14/21 0334  WBC 8.7  HGB 9.0*  HCT 29.1*  PLT 268    Recent Labs    09/14/21 0334  NA 134*  K 4.6  CL 98  CO2 32  GLUCOSE 167*  BUN 14  CREATININE 1.23  CALCIUM 8.4*     Intake/Output Summary (Last 24 hours) at 09/15/2021 5997 Last data filed at 09/15/2021 7414 Gross per 24 hour  Intake 1080 ml  Output 3700 ml  Net -2620 ml     Pressure Injury 08/10/21 Coccyx Medial Unstageable - Full thickness tissue loss in which the base of the injury is covered by slough (yellow, tan, gray, green or brown) and/or eschar (tan, brown or black) in the wound bed. (Active)  08/10/21 1700  Location: Coccyx (gluteal crease)  Location Orientation: Medial  Staging: Unstageable - Full thickness tissue loss in which the base of the injury is covered by slough (yellow, tan, gray, green or brown) and/or eschar (tan, brown or black) in the wound bed.  Wound Description (Comments):   Present on Admission: Yes    Physical Exam: Vital Signs Blood pressure (!) 167/83, pulse 85, temperature 98.2 F (36.8 C), temperature source Oral, resp. rate 20, height 5\' 11"  (1.803 m), weight 86 kg, SpO2 98 %.    General: awake, alert, appropriate, on 0.5L O2 by [place]; NAD HENT: conjugate gaze; oropharynx moist CV: regular rate; no JVD Pulmonary: CTA B/L; no W/R/R- good air movement GI: soft, NT, ND, (+)BS Psychiatric: appropriate Neurological: Ox3  Musc: No edema in extremities.  No tenderness in extremities. Neuro: Alert Motor: Strength is 5/5 in bilateral deltoid, bicep, tricep, grip, 4-/5 hip flexor,  knee extensors, 3/5 ankle dorsiflexor and plantar flexor, improving  Assessment/Plan: 1. Functional deficits which require 3+ hours per day of interdisciplinary therapy in a comprehensive inpatient rehab setting. Physiatrist is providing close team supervision and 24 hour management of active medical problems listed below. Physiatrist and rehab team continue to assess barriers to discharge/monitor patient progress toward functional and medical goals  Care Tool:  Bathing    Body parts bathed by patient: Right arm, Left arm, Chest, Abdomen, Front perineal area, Face, Right upper leg, Left upper leg   Body parts bathed by helper: Buttocks, Right lower leg, Left lower leg     Bathing assist Assist Level: Moderate Assistance - Patient 50 - 74%     Upper Body Dressing/Undressing Upper body dressing   What is the patient wearing?: Pull over shirt    Upper body assist Assist Level: Moderate Assistance - Patient 50 - 74%    Lower Body Dressing/Undressing Lower body dressing      What is the patient wearing?: Pants, Underwear/pull up     Lower body assist Assist for lower body dressing: Maximal Assistance -  Patient 25 - 49%     Toileting Toileting Toileting Activity did not occur Landscape architect and hygiene only): N/A (no void or bm) (pt had to be cathed)  Toileting assist Assist for toileting: Maximal Assistance - Patient 25 - 49%     Transfers Chair/bed transfer  Transfers assist  Chair/bed transfer activity did not occur: Safety/medical concerns  Chair/bed transfer assist level: Contact Guard/Touching assist     Locomotion Ambulation   Ambulation assist   Ambulation activity did not occur: Safety/medical concerns  Assist level: 2 helpers Assistive device: Walker-rolling Max distance: 400 ft   Walk 10 feet activity   Assist  Walk 10 feet activity did not occur: Safety/medical concerns  Assist level: 2 helpers Assistive device: Walker-rolling   Walk  50 feet activity   Assist Walk 50 feet with 2 turns activity did not occur: Safety/medical concerns  Assist level: 2 helpers Assistive device: Walker-rolling    Walk 150 feet activity   Assist Walk 150 feet activity did not occur: Safety/medical concerns  Assist level: 2 helpers Assistive device: Walker-rolling    Walk 10 feet on uneven surface  activity   Assist Walk 10 feet on uneven surfaces activity did not occur: Safety/medical concerns         Wheelchair     Assist Is the patient using a wheelchair?: Yes Type of Wheelchair: Manual Wheelchair activity did not occur: Safety/medical concerns         Wheelchair 50 feet with 2 turns activity    Assist    Wheelchair 50 feet with 2 turns activity did not occur: Safety/medical concerns       Wheelchair 150 feet activity     Assist  Wheelchair 150 feet activity did not occur: Safety/medical concerns       Blood pressure (!) 167/83, pulse 85, temperature 98.2 F (36.8 C), temperature source Oral, resp. rate 20, height 5\' 11"  (1.803 m), weight 86 kg, SpO2 98 %.    Medical Problem List and Plan: 1.  Decline in self care and mobility  secondary to thoracic myelopathy/thoracic osteomyelitis.   11/22- con't PT/OT/CIR- team conference today- will con't d/c date due to hydrotherapy.  2.  Antithrombotics: -DVT/anticoagulation:  N/A- due to GI bleed             -antiplatelet therapy: none 3. Pain Management: Oxycodone prn. Increased oxy to q4 hours prn- might need long acting?   Added MS Contin 15 mg BID for pain- and monitor  11/22- pain controlled con't regimen Controlled with meds on 11/19  4. Mood: LCSW to follow for evaluation and support.              -antipsychotic agents: N/A 5. Neuropsych: This patient is intermittently capable of making decisions on his own behalf. 6. Skin/Wound Care: Air mattress for pressure relief measures.   Hydrotherapy to wound  11/21- has 95% of slough per WOC-  wound opened up- hydrotherapy with [facility] Needs is the order- [facility]OC to follow weekly.  7. Fluids/Electrolytes/Nutrition: Monitor I/Os 8. MSSA bacteremia: On ancef with end date 12/01 9. UGIB/melena: Has received 11 units PRBC during admission --Continue protonix BID w/ carafate ac/hs.  --Monitor for signs of bleeding. Hemoglobin 8.4 on 11/16, labs ordered for Monday  11/21- Hb 9.0- doing better- con't to monitor 10. T2DM: Po intake poor --continue TF at nights. Continue insulin gargline 12 units at nights Slightly labile on 11/19, monitor for trend.   11/21- BG's variable- will monitor for trend  11/22- Bgs  doing a little better- drinking a lot of apple juice- likely the cause 11. HTN: Monitor BP TID--continue Norvasc and metoprolol for control.   Mildly elevated on 1/19, monitor for trend 12. ABLA: On aranesp weekly for supplement.  See #9 13. Acute renal insufficiency- Creatinine 1.41 on 11/17, labs ordered for Monday  11/21- Cr 1.23- and BUN 14- con't to monitor 14.  Supplemental oxygen dependent/acute resp failure  11/7- will wean O2 as able 15. Pulmonary edema vs B/L pneumonia  11/9- will give Lasix 40 mg daily x 3 days and KCL 40 mEq x3 days as well since K+ is borderline low.   11/15- CXR looks worse- B/L pulmonary edema vs pneumonia, hospitalist consulted  11/16- pulmonary effusions- due to bedrest- asked for flutter valve and pt to sit up as much as possible. Considering unstageable wound on backside  11/17- pt using flutter valve and ICS0 encouraged pt to use q1 hour while awake.   11/22- down to 0.5 L of O2- will con't to wean 16. Urinary retention- 11/8-started Flomax- foley out- in/out caths  #18 17. Pressure ulcer on sacrum/coccyx- unstageable  11/11- ordered WOC_ they are starting hydrotherapy M/W/F and wound care by nursing with santyl and saline gauze and foam dressing.   11/12- hydrotherapy went well  11/21- going well- still 95% slough- con't regimen  11/22- con't  hydrotherapy 3 days/week 18.  Hyponatremia  Sodium 131 on 11/17, labs ordered for Monday  11/21- Na 134- con't to monitor  LOS: 16 days A FACE TO FACE EVALUATION WAS PERFORMED  Danaija Eskridge 09/15/2021, 9:02 AM

## 2021-09-15 NOTE — Progress Notes (Addendum)
Patient ID: Jose [name], male   DOB: 24-May-1976, 45 y.o.   MRN: [id number]  met with pt to discuss team conference progress this week toward goals and discharge still 11/28. Called wife to try to schedule education and since kids are out of school she can not come in, can on Sat at 9:00. When discussed wound care she feels this is not her problem but he got this wound here and needs to have someone come out to the house to take care of it. She has done IV antibiotics already and is comfortable with this. Discussed the difficulty with finding a home health agency to take his insurance but will try, she will ask RN when here to show dressing changes but still receiving hydrotherapy. Pt asked about a wound clinic follow up will see when can see him. Work on discharge needs.  2:22 PM have contacted [facility] have scheduled appointment 12/29 @ 7:30 Am will place on cancellation list if one comes open sooner will call pt.

## 2021-09-16 LAB — GLUCOSE, CAPILLARY
Glucose-Capillary: 101 mg/dL — ABNORMAL HIGH (ref 70–99)
Glucose-Capillary: 144 mg/dL — ABNORMAL HIGH (ref 70–99)
Glucose-Capillary: 98 mg/dL (ref 70–99)
Glucose-Capillary: 241 mg/dL — ABNORMAL HIGH (ref 70–99)

## 2021-09-16 MED ORDER — PAROXETINE HCL 20 MG PO TABS
20.0000 mg | ORAL_TABLET | Freq: Every day | ORAL | Status: DC
  Administered 2021-09-16 – 2021-09-20 (×5): 20 mg via ORAL
  Filled 2021-09-16 (×6): qty 1

## 2021-09-16 MED ORDER — CEFAZOLIN IV (FOR PTA / DISCHARGE USE ONLY): 2.0000 g | Freq: Three times a day (TID) | INTRAVENOUS | 0 refills | Status: DC

## 2021-09-16 MED ORDER — COLLAGENASE 250 UNIT/GM EX OINT
TOPICAL_OINTMENT | Freq: Two times a day (BID) | CUTANEOUS | Status: DC
  Filled 2021-09-16 (×2): qty 30

## 2021-09-16 NOTE — Progress Notes (Signed)
Physical Therapy Session Note  Patient Details  Name: Jose [name] MRN: [id number] Date of Birth: 1976-07-29  Today's Date: 09/16/2021 PT Individual Time: [phone] and 1420-1505 PT Individual Time Calculation (min): 84 min and 45 min  Short Term Goals: Week 3:  PT Short Term Goal 1 (Week 3): =LTGs d/t d/c date  Skilled Therapeutic Interventions/Progress Updates: Tx1: Pt presented in bed agreeable to therapy. Pt c/o pain at sacrum and stated had not received pain meds as of yet. PTA notified nsg and meds provided during session. Rest breaks also provided as needed throughout session. Pt performed supine to sit with CGA and use of bed features with increased time. Pt then requesting to use bathroom. Performed Sit to stand with CGA and ambulated to toilet CGA. Pt able to doff brief and sit at elevated toilet with CGA (+BM/void). Pt performed stand with CGA and increased effort to perform peri-care, PTA followed up with washcloth to ensure cleanliness. Pt then ambulated to sit CGA and performed oral hygiene and washed face with set up assist at w/c level. Pt then noted to have increased pain in R peri-scapular area. PTA performed scap mobilizations and cross friction massage for pain management. Pt then ambulated >326f with RW and CGA with verbal cues for improved posture and decreased pushing through BUE on RW for pain management. During seated rest break PTA discussed progress and current LOF as compared to 1-2 weeks ago. Also discussed what pt feels are current limitations (car/stairs). Agreeable to continue focusing on stairs and agreeable to perform daily. Pt ambulated same distance back to room and performed sit to supine on flat bed with supervision and use of bed rail. Pt left in sidelying with call bell within reach and needs met.   Tx2: Pt presented in bed agreeable to therapy. Pt states pain at sacral wound and received pain meds at end of session. Pt agreeable to focus on stairs this  session. Performed bed mobility with CGA and increased time. Performed ambulatory transfer to w/c and pt transported to rehab gym for energy conservation. Pt then participated in step ups at 3 in step x 10 bilaterally for "warm up". Pt then ascended/descended x 8 steps with CGA. After seated rest pt was able to repeat with same assistance level. Pt then transported over to 6 in steps and pt was able to ascend/descend x 4 steps with B rails with CGA. Pt pleased with performance and agreeable to continue working on stairs until d/c. Pt transported back to room at end of session and performed ambulatory transfer back to bed. Performed sit to supine with supervision and pt left in sidelying with pillows supporting back, call bell within reach, and nsg present to administer meds.       Therapy Documentation Precautions:  Precautions Precautions: Fall, Back Precaution Booklet Issued:  (verbally reviewed post-up back precautions) Precaution Comments: 2L oxygen, high pain levels, PICC line Restrictions Weight Bearing Restrictions: No General:   Vital Signs:  Pain:   Mobility:   Locomotion :    Trunk/Postural Assessment :    Balance:   Exercises:   Other Treatments:      Therapy/Group: Individual Therapy  Jose [name] 09/16/2021, 12:22 PM

## 2021-09-16 NOTE — Progress Notes (Addendum)
PROGRESS NOTE   Subjective/complaints:   Pt admits anxiety is a big issue intermittently in last 2 weeks- bowel accidents and hydrotherapy really upset him.  Sleep is fair, but when woken up- clock watches.  Apple juice x 4 on tray- so likely cause of spikes in BG.  Having Bms 1-3x/day with accidents.    ROS:   Pt denies SOB, abd pain, CP, N/V/C/D, and vision changes   Objective:   No results found. Recent Labs    09/14/21 0334  WBC 8.7  HGB 9.0*  HCT 29.1*  PLT 268    Recent Labs    09/14/21 0334  NA 134*  K 4.6  CL 98  CO2 32  GLUCOSE 167*  BUN 14  CREATININE 1.23  CALCIUM 8.4*     Intake/Output Summary (Last 24 hours) at 09/16/2021 0855 Last data filed at 09/16/2021 5027 Gross per 24 hour  Intake 1560 ml  Output 1275 ml  Net 285 ml     Pressure Injury 08/10/21 Coccyx Medial Unstageable - Full thickness tissue loss in which the base of the injury is covered by slough (yellow, tan, gray, green or brown) and/or eschar (tan, brown or black) in the wound bed. (Active)  08/10/21 1700  Location: Coccyx (gluteal crease)  Location Orientation: Medial  Staging: Unstageable - Full thickness tissue loss in which the base of the injury is covered by slough (yellow, tan, gray, green or brown) and/or eschar (tan, brown or black) in the wound bed.  Wound Description (Comments):   Present on Admission: Yes    Physical Exam: Vital Signs Blood pressure (!) 148/70, pulse 74, temperature 99.2 F (37.3 C), temperature source Oral, resp. rate 16, height 5\' 11"  (1.803 m), weight 87 kg, SpO2 92 %.     General: awake, alert, appropriate, off O2 completely- all through night; laying supine in bed; NAD HENT: conjugate gaze; oropharynx moist CV: regular rate; no JVD Pulmonary: CTA B/L; no W/R/R- good air movement- sounds great GI: soft, NT, ND, (+)BS Psychiatric: appropriate- but got anxious when discussing  hydrotherapy and bowels Neurological: Ox3  Musc: No edema in extremities.  No tenderness in extremities. Neuro: Alert Motor: Strength is 5/5 in bilateral deltoid, bicep, tricep, grip, 4-/5 hip flexor, knee extensors, 3/5 ankle dorsiflexor and plantar flexor, improving  Assessment/Plan: 1. Functional deficits which require 3+ hours per day of interdisciplinary therapy in a comprehensive inpatient rehab setting. Physiatrist is providing close team supervision and 24 hour management of active medical problems listed below. Physiatrist and rehab team continue to assess barriers to discharge/monitor patient progress toward functional and medical goals  Care Tool:  Bathing    Body parts bathed by patient: Right arm, Left arm, Chest, Abdomen, Front perineal area, Face, Right upper leg, Left upper leg   Body parts bathed by helper: Buttocks, Right lower leg, Left lower leg     Bathing assist Assist Level: Moderate Assistance - Patient 50 - 74%     Upper Body Dressing/Undressing Upper body dressing   What is the patient wearing?: Pull over shirt    Upper body assist Assist Level: Moderate Assistance - Patient 50 - 74%    Lower Body  Dressing/Undressing Lower body dressing      What is the patient wearing?: Pants, Underwear/pull up     Lower body assist Assist for lower body dressing: Maximal Assistance - Patient 25 - 49%     Toileting Toileting Toileting Activity did not occur (Clothing management and hygiene only): N/A (no void or bm) (pt had to be cathed)  Toileting assist Assist for toileting: Maximal Assistance - Patient 25 - 49%     Transfers Chair/bed transfer  Transfers assist  Chair/bed transfer activity did not occur: Safety/medical concerns  Chair/bed transfer assist level: Contact Guard/Touching assist     Locomotion Ambulation   Ambulation assist   Ambulation activity did not occur: Safety/medical concerns  Assist level: Contact Guard/Touching  assist Assistive device: Walker-rolling Max distance: 400 ft   Walk 10 feet activity   Assist  Walk 10 feet activity did not occur: Safety/medical concerns  Assist level: Contact Guard/Touching assist Assistive device: Walker-rolling   Walk 50 feet activity   Assist Walk 50 feet with 2 turns activity did not occur: Safety/medical concerns  Assist level: Contact Guard/Touching assist Assistive device: Walker-rolling    Walk 150 feet activity   Assist Walk 150 feet activity did not occur: Safety/medical concerns  Assist level: 2 helpers Assistive device: Walker-rolling    Walk 10 feet on uneven surface  activity   Assist Walk 10 feet on uneven surfaces activity did not occur: Safety/medical concerns         Wheelchair     Assist Is the patient using a wheelchair?: No Type of Wheelchair: Manual Wheelchair activity did not occur: Safety/medical concerns         Wheelchair 50 feet with 2 turns activity    Assist    Wheelchair 50 feet with 2 turns activity did not occur: Safety/medical concerns       Wheelchair 150 feet activity     Assist  Wheelchair 150 feet activity did not occur: Safety/medical concerns       Blood pressure (!) 148/70, pulse 74, temperature 99.2 F (37.3 C), temperature source Oral, resp. rate 16, height 5\' 11"  (1.803 m), weight 87 kg, SpO2 92 %.    Medical Problem List and Plan: 1.  Decline in self care and mobility  secondary to thoracic myelopathy/thoracic osteomyelitis.   11/22- con't PT/OT/CIR- team conference today- will con't d/c date due to hydrotherapy. 11/23- dC date 11/28 due to function, but still has wound issues, so cannot move up easily.   2.  Antithrombotics: -DVT/anticoagulation:  N/A- due to GI bleed             -antiplatelet therapy: none 3. Pain Management: Oxycodone prn. Increased oxy to q4 hours prn- might need long acting?   Added MS Contin 15 mg BID for pain- and monitor  11/22- pain  controlled con't regimen Controlled with meds on 11/19  4. Mood: LCSW to follow for evaluation and support.   11/23- anxiety- will add Paxil 20 mg QHS and maintain Xanax- don't want to increase/ change Xanax due to being on pain meds- went over with pt about this issue.              -antipsychotic agents: N/A 5. Neuropsych: This patient is intermittently capable of making decisions on his own behalf. 6. Skin/Wound Care: Air mattress for pressure relief measures.   Hydrotherapy to wound  11/21- has 95% of slough per WOC- wound opened up- hydrotherapy with Annitta Needs is the order- WOC to follow weekly.  7.  Fluids/Electrolytes/Nutrition: Monitor I/Os 8. MSSA bacteremia: On ancef with end date 12/01 9. UGIB/melena: Has received 11 units PRBC during admission --Continue protonix BID w/ carafate ac/hs.  --Monitor for signs of bleeding. Hemoglobin 8.4 on 11/16, labs ordered for Monday  11/21- Hb 9.0- doing better- con't to monitor 10. T2DM: Po intake poor --continue TF at nights. Continue insulin gargline 12 units at nights Slightly labile on 11/19, monitor for trend.   11/21- BG's variable- will monitor for trend  11/22- Bgs doing a little better- drinking a lot of apple juice- likely the cause 11/23- saw 4 apple juice cups on tray- likely the cause- advised ot decrease juice intake.  11. HTN: Monitor BP TID--continue Norvasc and metoprolol for control.   11/23- occ mild elevation- will con't regimen for now and follow trneds 12. ABLA: On aranesp weekly for supplement.  See #9 13. Acute renal insufficiency- Creatinine 1.41 on 11/17, labs ordered for Monday  11/21- Cr 1.23- and BUN 14- con't to monitor 14.  Supplemental oxygen dependent/acute resp failure  11/7- will wean O2 as able 15. Pulmonary edema vs B/L pneumonia  11/9- will give Lasix 40 mg daily x 3 days and KCL 40 mEq x3 days as well since K+ is borderline low.   11/15- CXR looks worse- B/L pulmonary edema vs pneumonia, hospitalist  consulted  11/16- pulmonary effusions- due to bedrest- asked for flutter valve and pt to sit up as much as possible. Considering unstageable wound on backside  11/17- pt using flutter valve and ICS0 encouraged pt to use q1 hour while awake.   11/22- down to 0.5 L of O2- will con't to wean  11/23- off O2! 16. Urinary retention- 11/8-started Flomax- foley out- in/out caths  #18 17. Pressure ulcer on sacrum/coccyx- unstageable  11/11- ordered WOC_ they are starting hydrotherapy M/W/F and wound care by nursing with santyl and saline gauze and foam dressing.   11/12- hydrotherapy went well  11/21- going well- still 95% slough- con't regimen  11/22- con't hydrotherapy 3 days/week 18.  Hyponatremia  Sodium 131 on 11/17, labs ordered for Monday  11/21- Na 134- con't to monitor 19. Bowel incontinence/frequency- will sopt Senokot- con't miralax.   LOS: 17 days A FACE TO FACE EVALUATION WAS PERFORMED  Torrey Ballinas 09/16/2021, 8:55 AM

## 2021-09-16 NOTE — Progress Notes (Signed)
Reached out to [facility] and general surgery regarding update from hydrotherapy. Patient continues to have drainage with "psychedelic" appearing slough since dakin's was discontinued. WOC recommended reaching out to general surgery. PA has evaluated pics and chart and felt that wound was progressing along without signs of infection (no fever/WBC elevation). To continue santyl, increase frequency of dressing changes and educate family on care and make sure to follow up with wound clinic. They  do not fell that surgical intervention needed at this time.

## 2021-09-16 NOTE — Progress Notes (Signed)
Occupational Therapy Session Note  Patient Details  Name: VIRGINIA FRANCISCO MRN: [id number] Date of Birth: Jan 11, 1976  Today's Date: 09/16/2021 OT Individual Time: 1100-1130 OT Individual Time Calculation (min): 30 min    Short Term Goals: Week 1:  OT Short Term Goal 1 (Week 1): Pt will tolerate sitting EOB ~10 minutes during an ADL session to demonstrate improvement in pain mgt OT Short Term Goal 1 - Progress (Week 1): Progressing toward goal OT Short Term Goal 2 (Week 1): Pt will complete 1/3 components of donning pants with AE as needed OT Short Term Goal 2 - Progress (Week 1): Met OT Short Term Goal 3 (Week 1): Pt will complete LB dressing at sit<stand level using LRAD OT Short Term Goal 3 - Progress (Week 1): Progressing toward goal Week 2:  OT Short Term Goal 1 (Week 2): Pt will perform LB dressing bed level with Mod A and using AE OT Short Term Goal 2 (Week 2): Pt will perform BSC/toilet transfer with LRAD and CGA OT Short Term Goal 3 (Week 2): Pt will sit EOB or in wheelchair during ADL for 10 minutes while performing a task   Skilled Therapeutic Interventions/Progress Updates:    Pt greeted at time of session side lying on L side bed level, reviewing encounter this AM regarding pain medications and hydrotherapy. Pt politely declining any OOB activity and stating he performed ADL this am. Did not rate pain during session as he remained bed level and did not c/o. Focus of session on patient education and DC planning, planning for family ed with wife Junie Panning on Saturday, discussing home set up, CLOF and OT goals. Also provided UE level green therabands for pt to use at home per request. Positioned RUE up with pillows to decrease shoulder discomfort and educated on shoulder positioning. Pt left bed level call bell in reach all needs met.   Therapy Documentation Precautions:  Precautions Precautions: Fall, Back Precaution Booklet Issued:  (verbally reviewed post-up back  precautions) Precaution Comments: 2L oxygen, high pain levels, PICC line Restrictions Weight Bearing Restrictions: No     Therapy/Group: Individual Therapy  Viona Gilmore 09/16/2021, 7:26 AM

## 2021-09-16 NOTE — Progress Notes (Signed)
Physical Therapy Wound Treatment Patient Details  Name: Jose [name] MRN: [id number] Date of Birth: 1976/01/17  Today's Date: 09/16/2021 PT Individual Time: 1005-1040 PT Individual Time Calculation (min): 35 min   Subjective  Subjective: Pt reporting intense soreness from either side of the wound where indurated. Patient and Family Stated Goals: get the wound healed and do well with therapies. Date of Onset:  (unknown) Prior Treatments:  (unknown)  Pain Score:  6/10 with premedication   Wound Assessment  Pressure Injury 08/10/21 Coccyx Medial Unstageable - Full thickness tissue loss in which the base of the injury is covered by slough (yellow, tan, gray, green or brown) and/or eschar (tan, brown or black) in the wound bed. (Active)  Wound Image   09/14/21 1657  Dressing Type Barrier Film (skin prep);Gauze (Comment);Dakin's-soaked gauze;Moist to dry;Silicone dressing 29/52/84 1119  Dressing Changed;Intact 09/16/21 1119  Dressing Change Frequency Monday, Wednesday, Friday 09/16/21 1119  State of Healing Early/partial granulation 09/16/21 1119  Site / Wound Assessment Clean;Yellow;Red;Pink 09/16/21 1119  % Wound base Red or Granulating 25% 09/16/21 1119  % Wound base Yellow/Fibrinous Exudate 75% 09/16/21 1119  % Wound base Black/Eschar 0% 09/16/21 1119  % Wound base Other/Granulation Tissue (Comment) 0% 09/16/21 1119  Peri-wound Assessment Intact 09/14/21 1657  Wound Length (cm) 7 cm 09/09/21 1349  Wound Width (cm) 3.5 cm 09/09/21 1349  Wound Depth (cm) 3 cm 09/09/21 1349  Wound Surface Area (cm^2) 24.5 cm^2 09/09/21 1349  Wound Volume (cm^3) 73.5 cm^3 09/09/21 1349  Margins Unattached edges (unapproximated) 09/14/21 1657  Drainage Amount None 09/15/21 1033  Drainage Description Other (Comment) 09/14/21 1657  Treatment Cleansed;Debridement (Selective);Hydrotherapy (Pulse lavage);Other (Comment) 09/16/21 1119      Hydrotherapy Pulsed lavage therapy - wound location:  sacral/coccyx Pulsed Lavage with Suction (psi): 12 psi Pulsed Lavage with Suction - Normal Saline Used: 1000 mL Pulsed Lavage Tip: Tip with splash shield Selective Debridement Selective Debridement - Location: sacral/coccyx Selective Debridement - Tools Used: Scalpel;Forceps Selective Debridement - Tissue Removed: Thick yellow eschar covering wound base   Wound Assessment and Plan  Wound Therapy - Assess/Plan/Recommendations Wound Therapy - Clinical Statement: Wound improving slowly. Yellow slough and scar tissue remains adherent with difficult debridement.. Pt will benefit from further selective debridement and Santyl to remove necrotic tissue.  Asked for another few days of DAKIN for bright yellow exudate. Wound Therapy - Functional Problem List: pain limiting bed mobility and OOB activity. Factors Delaying/Impairing Wound Healing: Diabetes Mellitus;Multiple medical problems Hydrotherapy Plan: Debridement;Dressing change;Pulsatile lavage with suction;Patient/family education Wound Therapy - Frequency: 3X / week Wound Therapy - Current Recommendations: PT Wound Therapy - Follow Up Recommendations: dressing changes by RN;dressing changes by family/patient  Wound Therapy Goals- Improve the function of patient's integumentary system by progressing the wound(s) through the phases of wound healing (inflammation - proliferation - remodeling) by: Decrease Necrotic Tissue to: 20% Decrease Necrotic Tissue - Progress: Progressing toward goal Increase Granulation Tissue to: 80% Increase Granulation Tissue - Progress: Progressing toward goal Improve Drainage Characteristics: Min;Serous Improve Drainage Characteristics - Progress: Not progressing Goals/treatment plan/discharge plan were made with and agreed upon by patient/family: Yes Time For Goal Achievement: 7 days Wound Therapy - Potential for Goals: Good  Goals will be updated until maximal potential achieved or discharge criteria met.   Discharge criteria: when goals achieved, discharge from hospital, MD decision/surgical intervention, no progress towards goals, refusal/missing three consecutive treatments without notification or medical reason.  GP     Tessie Fass Jose [name] 09/16/2021, 11:25 AM 09/16/2021  Jose [name]., PT [facility] [phone]  (pager) [phone]  (office)

## 2021-09-16 NOTE — Progress Notes (Signed)
Patient ID: Jose [name], male   DOB: [birth year], 45 y.o.   MRN: [id number] Have contacted numerous home health agencies that can not accept case due to staffing issues. [facility] to see regarding IV antibiotics. Wife will need to be educated on dressing changes. Wife to come in Sat for PT session and RN aware of this.

## 2021-09-16 NOTE — Progress Notes (Signed)
Nutrition Follow-up  DOCUMENTATION CODES:   Not applicable  INTERVENTION:  Continue Boost Breeze po TID, each supplement provides 250 kcal and 9 grams of protein   Continue Beneprotein 1 scoop po TID with meal, each scoop provides 25 kcal and 6 grams of protein.    Continue Juven BID, each packet provides 95 calories, 2.5 grams of protein.   Encourage adequate PO intake.  NUTRITION DIAGNOSIS:   Increased nutrient needs related to post-op healing as evidenced by estimated needs; ongoing  GOAL:   Patient will meet greater than or equal to 90% of their needs; progressing  MONITOR:   PO intake, Supplement acceptance, Diet advancement, Labs, Weight trends, Skin, I & O's  REASON FOR ASSESSMENT:   Consult Assessment of nutrition requirement/status  ASSESSMENT:   45 year old male with history of T2DM with neuropathy and retinopathy, CKD, Macular degeneration, GAD. Recent findings of lytic lesions T5,T6 and sternum with pathologic fracture T6 as well as MSSA bacteremia. 10/14 with worsening of pain and found to have increased in paraspinal phelegmon with new epidural phelgmon and interval T5 pathologic fracture with severe left eccentric canal stenosis and overall finding likely secondary to progressive diskitis/osteomyelitis. S/p and T5 laminectomy with left T5 pediculotomy for decompression of spinal cord. 10/17 revealing severe esophagitis with black discoloration of mucosa, large amount of retained coffee ground fluid in stomach which was suctioned as well as few non bleeding superficial duodenal ulcers without stigmata of bleeding. Repeat EGD showing improvement in esophagitis Pt limited by back pain and weakness affecting ADLs and mobility. CIR recommended due to functional decline. Cortrak out 11/5.  Meal completion has been 75-100%. Pt currently has Boost Breeze, Juven, and Beneprotein powder ordered with varied consumption. RD to continue with current orders to aid in increased  caloric and protein needs as well as in wound healing. Labs and medications reviewed.   Diet Order:   Diet Order             DIET DYS 3 Room service appropriate? Yes with Assist; Fluid consistency: Thin  Diet effective now                   EDUCATION NEEDS:   Not appropriate for education at this time  Skin:  Skin Assessment: Reviewed RN Assessment Skin Integrity Issues:: Unstageable DTI: buttocks Stage II: N/A Unstageable: coccyx Diabetic Ulcer: L big toe Incisions: back  Last BM:  11/22  Height:   Ht Readings from Last 1 Encounters:  08/30/21 5\' 11"  (1.803 m)    Weight:   Wt Readings from Last 1 Encounters:  09/16/21 87 kg   BMI:  Body mass index is 26.75 kg/m.  Estimated Nutritional Needs:   Kcal:  2150-2350  Protein:  110-120 grams  Fluid:  >/= 2 L/day  Corrin Parker, MS, RD, LDN RD pager number/after hours weekend pager number on Amion.

## 2021-09-17 LAB — GLUCOSE, CAPILLARY
Glucose-Capillary: 89 mg/dL (ref 70–99)
Glucose-Capillary: 130 mg/dL — ABNORMAL HIGH (ref 70–99)
Glucose-Capillary: 155 mg/dL — ABNORMAL HIGH (ref 70–99)
Glucose-Capillary: 137 mg/dL — ABNORMAL HIGH (ref 70–99)

## 2021-09-17 NOTE — Progress Notes (Signed)
Pt. Voided 825 mL. PVR was 321mL. Pt. Refused to have in and out catheter performed.

## 2021-09-17 NOTE — Progress Notes (Signed)
PROGRESS NOTE   Subjective/complaints:   Pt admits stools still 1-3x/day, but did take senokot yesterday.  No side effects with Paxil- did stairs with therapy Family training is Saturday.    ROS:   Pt denies SOB, abd pain, CP, N/V/C/D, and vision changes    Objective:   No results found. No results for input(s): WBC, HGB, HCT, PLT in the last 72 hours.   No results for input(s): NA, K, CL, CO2, GLUCOSE, BUN, CREATININE, CALCIUM in the last 72 hours.    Intake/Output Summary (Last 24 hours) at 09/17/2021 0852 Last data filed at 09/17/2021 9163 Gross per 24 hour  Intake 480 ml  Output 1775 ml  Net -1295 ml     Pressure Injury 08/10/21 Coccyx Medial Unstageable - Full thickness tissue loss in which the base of the injury is covered by slough (yellow, tan, gray, green or brown) and/or eschar (tan, brown or black) in the wound bed. (Active)  08/10/21 1700  Location: Coccyx (gluteal crease)  Location Orientation: Medial  Staging: Unstageable - Full thickness tissue loss in which the base of the injury is covered by slough (yellow, tan, gray, green or brown) and/or eschar (tan, brown or black) in the wound bed.  Wound Description (Comments):   Present on Admission: Yes    Physical Exam: Vital Signs Blood pressure 126/78, pulse 70, temperature 98.2 F (36.8 C), temperature source Oral, resp. rate 18, height _0  (1.803 m), weight 79 kg, SpO2 96 %.      General: awake, alert, appropriate, laying supine- just woke up; NAD HENT: conjugate gaze; oropharynx moist CV: regular rate; no JVD Pulmonary: CTA B/L; no W/R/R- good air movement- Off O2 GI: soft, NT, ND, (+)BS Psychiatric: appropriate-less anxious this AM Neurological: Ox3  Musc: No edema in extremities.  No tenderness in extremities. Neuro: Alert Motor: Strength is 5/5 in bilateral deltoid, bicep, tricep, grip, 4-/5 hip flexor, knee extensors, 3/5 ankle  dorsiflexor and plantar flexor, improving  Assessment/Plan: 1. Functional deficits which require 3+ hours per day of interdisciplinary therapy in a comprehensive inpatient rehab setting. Physiatrist is providing close team supervision and 24 hour management of active medical problems listed below. Physiatrist and rehab team continue to assess barriers to discharge/monitor patient progress toward functional and medical goals  Care Tool:  Bathing    Body parts bathed by patient: Right arm, Left arm, Chest, Abdomen, Front perineal area, Face, Right upper leg, Left upper leg   Body parts bathed by helper: Buttocks, Right lower leg, Left lower leg     Bathing assist Assist Level: Moderate Assistance - Patient 50 - 74%     Upper Body Dressing/Undressing Upper body dressing   What is the patient wearing?: Pull over shirt    Upper body assist Assist Level: Moderate Assistance - Patient 50 - 74%    Lower Body Dressing/Undressing Lower body dressing      What is the patient wearing?: Pants, Underwear/pull up     Lower body assist Assist for lower body dressing: Maximal Assistance - Patient 25 - 49%     Toileting Toileting Toileting Activity did not occur (Clothing management and hygiene only): N/A (no void or  bm) (pt had to be cathed)  Toileting assist Assist for toileting: Maximal Assistance - Patient 25 - 49%     Transfers Chair/bed transfer  Transfers assist  Chair/bed transfer activity did not occur: Safety/medical concerns  Chair/bed transfer assist level: Contact Guard/Touching assist     Locomotion Ambulation   Ambulation assist   Ambulation activity did not occur: Safety/medical concerns  Assist level: Contact Guard/Touching assist Assistive device: Walker-rolling Max distance: 400 ft   Walk 10 feet activity   Assist  Walk 10 feet activity did not occur: Safety/medical concerns  Assist level: Contact Guard/Touching assist Assistive device:  Walker-rolling   Walk 50 feet activity   Assist Walk 50 feet with 2 turns activity did not occur: Safety/medical concerns  Assist level: Contact Guard/Touching assist Assistive device: Walker-rolling    Walk 150 feet activity   Assist Walk 150 feet activity did not occur: Safety/medical concerns  Assist level: 2 helpers Assistive device: Walker-rolling    Walk 10 feet on uneven surface  activity   Assist Walk 10 feet on uneven surfaces activity did not occur: Safety/medical concerns         Wheelchair     Assist Is the patient using a wheelchair?: No Type of Wheelchair: Manual Wheelchair activity did not occur: Safety/medical concerns         Wheelchair 50 feet with 2 turns activity    Assist    Wheelchair 50 feet with 2 turns activity did not occur: Safety/medical concerns       Wheelchair 150 feet activity     Assist  Wheelchair 150 feet activity did not occur: Safety/medical concerns       Blood pressure 126/78, pulse 70, temperature 98.2 F (36.8 C), temperature source Oral, resp. rate 18, height $RemoveBe'5\' 11"'[id number]$  (1.803 m), weight 79 kg, SpO2 96 %.    Medical Problem List and Plan: 1.  Decline in self care and mobility  secondary to thoracic myelopathy/thoracic osteomyelitis.   11/22- con't PT/OT/CIR- team conference today- will con't d/c date due to hydrotherapy. 11/23- dC date 11/28 due to function, but still has wound issues, so cannot move up easily.    11/24- con't CIR_ PT and OT and wound care 2.  Antithrombotics: -DVT/anticoagulation:  N/A- due to GI bleed             -antiplatelet therapy: none 3. Pain Management: Oxycodone prn. Increased oxy to q4 hours prn- might need long acting?   Added MS Contin 15 mg BID for pain- and monitor  11/24- pain controlled- con't regimen Controlled with meds on 11/19  4. Mood: LCSW to follow for evaluation and support.   11/23- anxiety- will add Paxil 20 mg QHS and maintain Xanax- don't want to  increase/ change Xanax due to being on pain meds- went over with pt about this issue.   11/24- no side effects from Paxil- con't regimen             -antipsychotic agents: N/A 5. Neuropsych: This patient is intermittently capable of making decisions on his own behalf. 6. Skin/Wound Care: Air mattress for pressure relief measures.   Hydrotherapy to wound  11/21- has 95% of slough per WOC- wound opened up- hydrotherapy with Annitta Needs is the order- WOC to follow weekly.  7. Fluids/Electrolytes/Nutrition: Monitor I/Os 8. MSSA bacteremia: On ancef with end date 12/01  11/24- weekly labs looking OK. Will double check ESR/CRP since has wound.  9. UGIB/melena: Has received 11 units PRBC during admission --Continue protonix BID  w/ carafate ac/hs.  --Monitor for signs of bleeding. Hemoglobin 8.4 on 11/16, labs ordered for Monday  11/21- Hb 9.0- doing better- con't to monitor 10. T2DM: Po intake poor --continue TF at nights. Continue insulin gargline 12 units at nights Slightly labile on 11/19, monitor for trend.   11/21- BG's variable- will monitor for trend  11/22- Bgs doing a little better- drinking a lot of apple juice- likely the cause 11/23- saw 4 apple juice cups on tray- likely the cause- advised ot decrease juice intake.   11/24- Bgs labile- asked pt again to avoid juices 11. HTN: Monitor BP TID--continue Norvasc and metoprolol for control.   11/23- occ mild elevation- will con't regimen for now and follow trneds 12. ABLA: On aranesp weekly for supplement.  See #9 13. Acute renal insufficiency- Creatinine 1.41 on 11/17, labs ordered for Monday  11/21- Cr 1.23- and BUN 14- con't to monitor 14.  Supplemental oxygen dependent/acute resp failure  11/7- will wean O2 as able 15. Pulmonary edema vs B/L pneumonia  11/9- will give Lasix 40 mg daily x 3 days and KCL 40 mEq x3 days as well since K+ is borderline low.   11/15- CXR looks worse- B/L pulmonary edema vs pneumonia, hospitalist  consulted  11/16- pulmonary effusions- due to bedrest- asked for flutter valve and pt to sit up as much as possible. Considering unstageable wound on backside  11/17- pt using flutter valve and ICS0 encouraged pt to use q1 hour while awake.   11/22- down to 0.5 L of O2- will con't to wean  11/23- off O2! 16. Urinary retention- 11/8-started Flomax- foley out- in/out caths  11/24- is refusing caths when PVRs 350cc- will d/w pt.  17. Pressure ulcer on sacrum/coccyx- unstageable  11/11- ordered [facility]OC_ they are starting hydrotherapy M/W/F and wound care by nursing with santyl and saline gauze and foam dressing.   11/12- hydrotherapy went well  11/21- going well- still 95% slough- con't regimen  11/22- con't hydrotherapy 3 days/week  11/24- no progressing per wound care- surgery said no intervention- will d/w pt about calling ID 18.  Hyponatremia  Sodium 131 on 11/17, labs ordered for Monday  11/21- Na 134- con't to monitor 19. Bowel incontinence/frequency- will sopt Senokot- con't miralax.   11/24- no better yet- will con't new regimen  LOS: 18 days A FACE TO FACE EVALUATION WAS PERFORMED  Eryn Krejci 09/17/2021, 8:52 AM

## 2021-09-18 ENCOUNTER — Inpatient Hospital Stay (HOSPITAL_COMMUNITY): Payer: 59

## 2021-09-18 ENCOUNTER — Inpatient Hospital Stay: Payer: Self-pay

## 2021-09-18 LAB — GLUCOSE, CAPILLARY
Glucose-Capillary: 143 mg/dL — ABNORMAL HIGH (ref 70–99)
Glucose-Capillary: 184 mg/dL — ABNORMAL HIGH (ref 70–99)
Glucose-Capillary: 125 mg/dL — ABNORMAL HIGH (ref 70–99)
Glucose-Capillary: 161 mg/dL — ABNORMAL HIGH (ref 70–99)

## 2021-09-18 IMAGING — MR MR SACRUM / SI JOINTS WO/W CM
4 of 9 series · 19 of 48 positions shown · IV contrast (YES GAD)
Comparison: Pelvic CT [DATE].

CLINICAL DATA: Coccygeal pressure ulcer.  Thoracic osteomyelitis.

EXAM:
MRI SACRUM WITHOUT CONTRAST
TECHNIQUE: Multiplanar multi-sequence MR imaging of the sacrum was performed.
No intravenous contrast was administered.

[Series 4: T1 · axial · 4.0mm · 0.59mm/px · z∈[+9,+233]mm · 6 of 46 slices shown (1 of 2)]
[im 1/46]
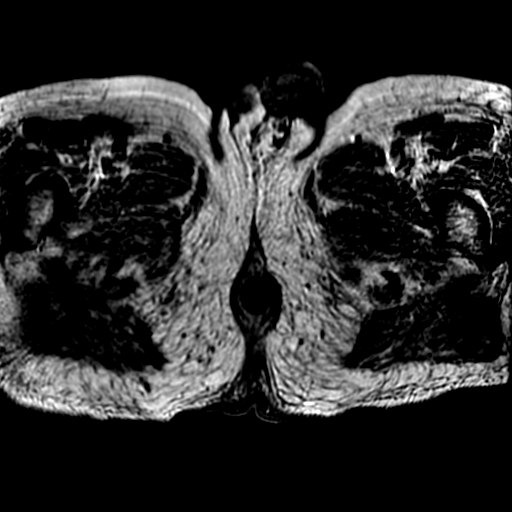
[im 10/46]
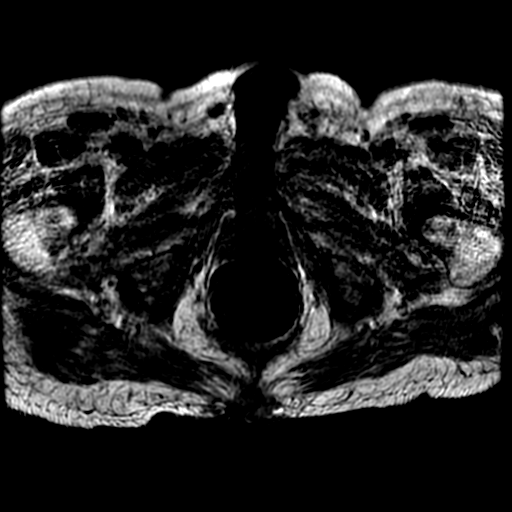
[im 19/46]
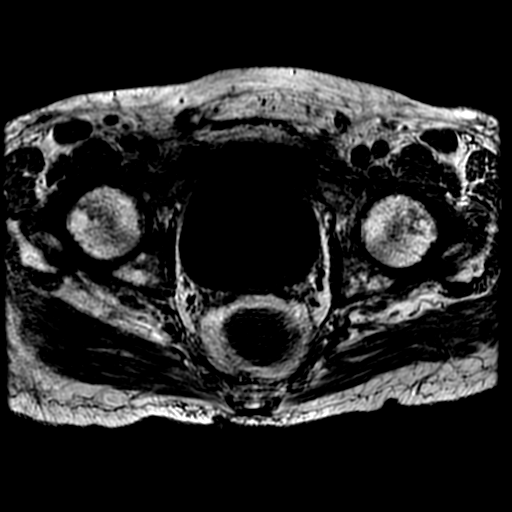
[im 28/46]
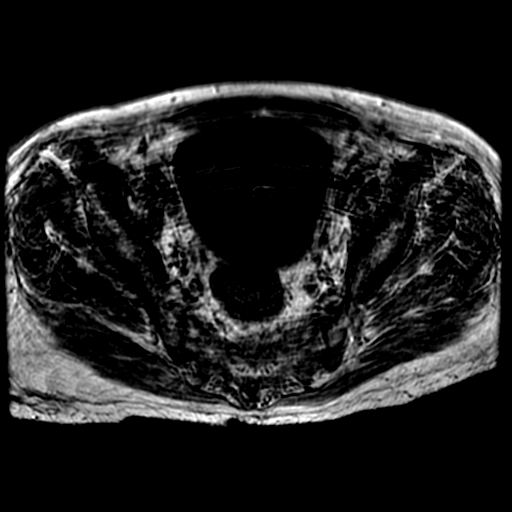
[im 37/46]
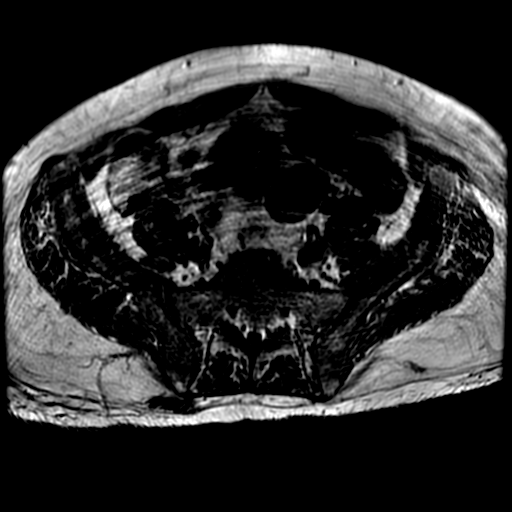
[im 46/46]
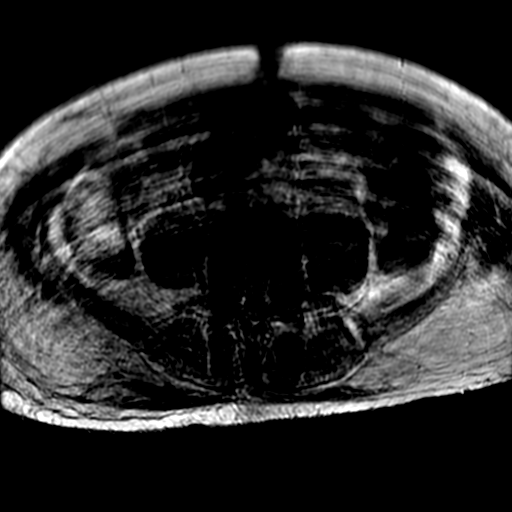

[Series 7: T1 · coronal · 3.0mm · 0.78mm/px · 4 of 24 slices shown (2 of 2)]
[im 1/24]
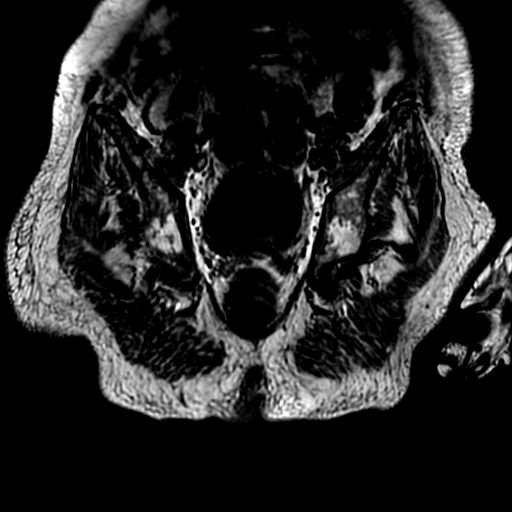
[im 8/24]
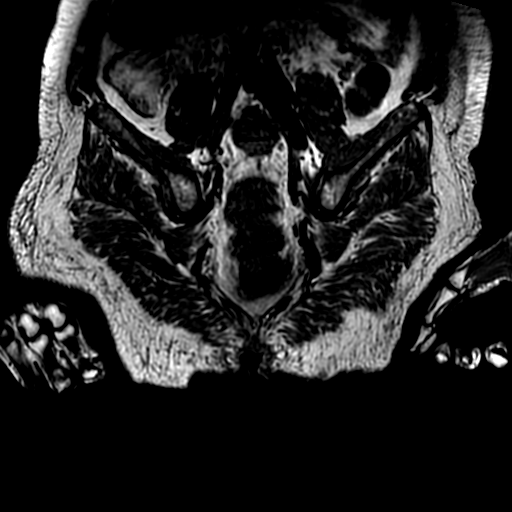
[im 16/24]
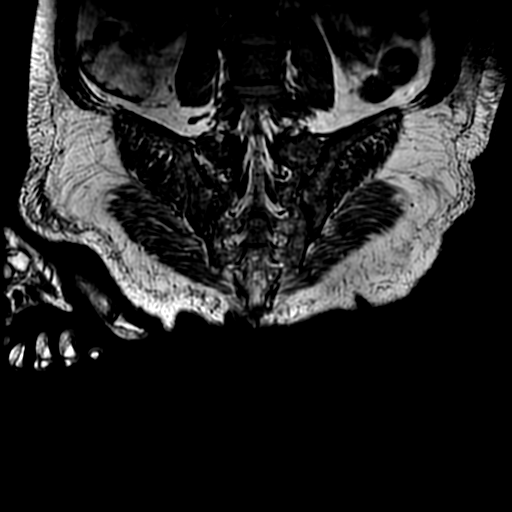
[im 24/24]
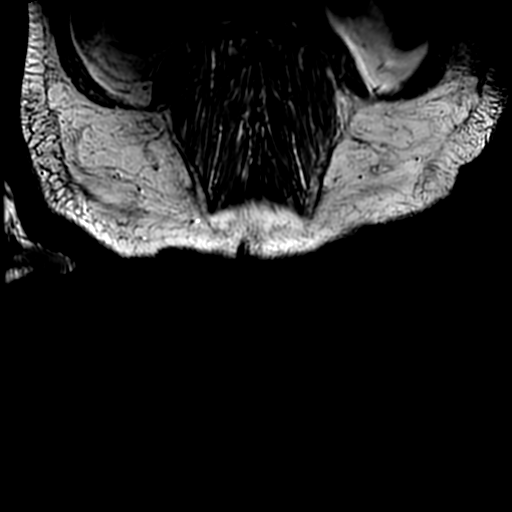

[Series 9: T1 fat-sat · axial · non-contrast · 4.0mm · 0.59mm/px · z∈[+9,+193]mm · 6 of 46 slices shown]
[im 1/46]
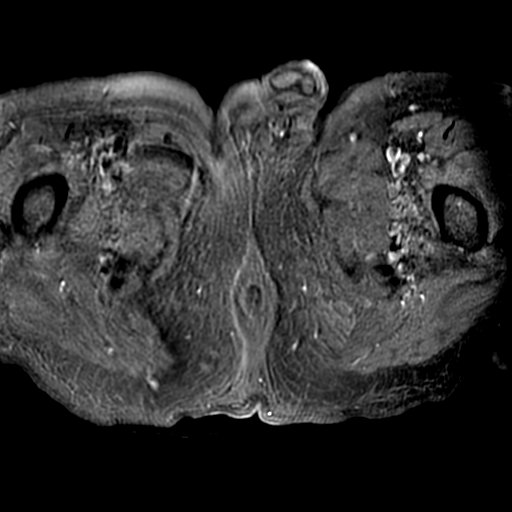
[im 8/46]
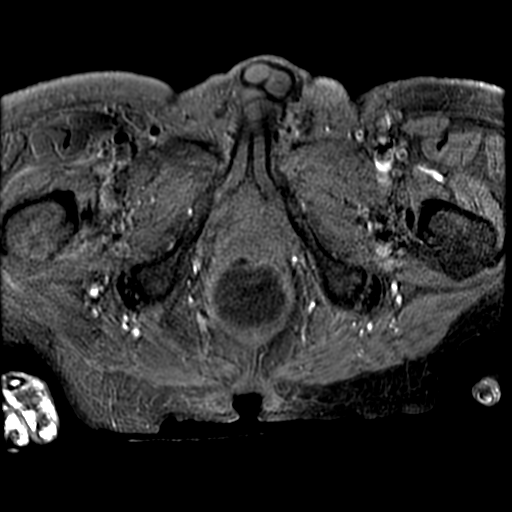
[im 16/46]
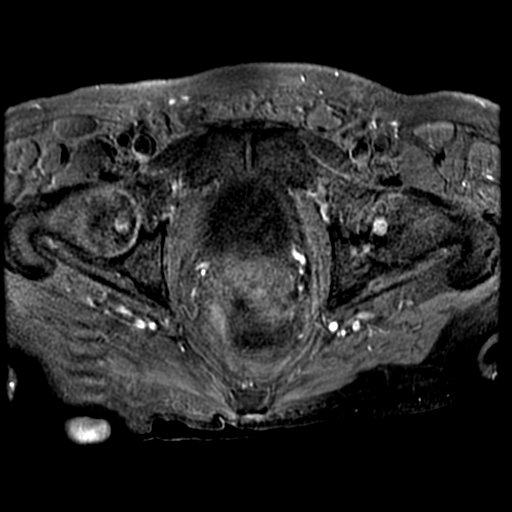
[im 23/46]
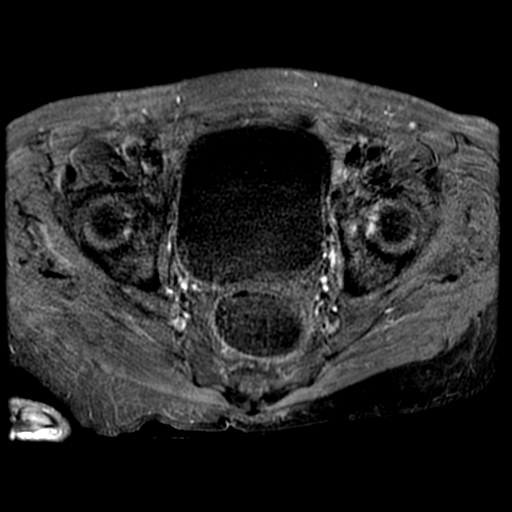
[im 31/46]
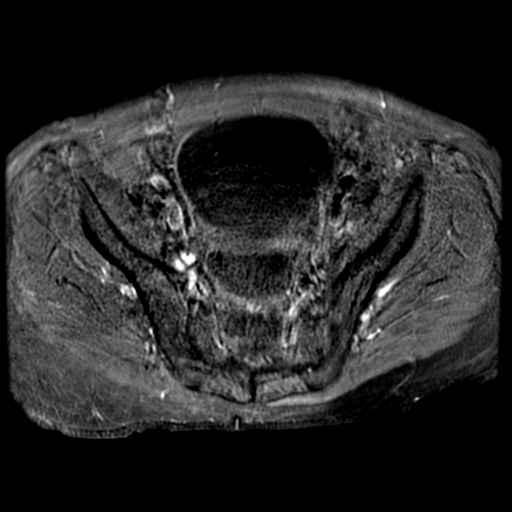
[im 38/46]
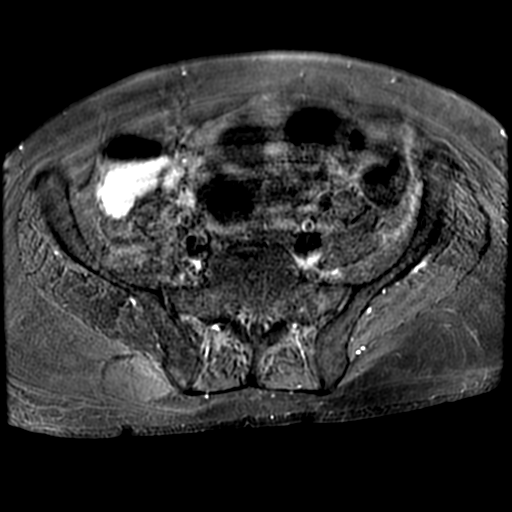

[Series 10: T1 fat-sat post-contrast · axial · 4.0mm · 0.59mm/px · z∈[+44,+193]mm · 3 of 46 slices shown]
[im 8/46]
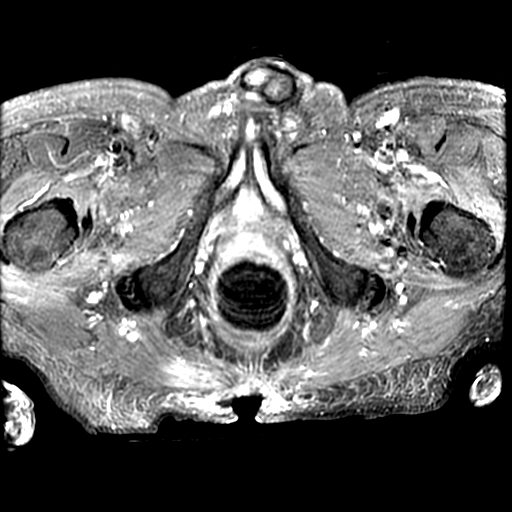
[im 23/46]
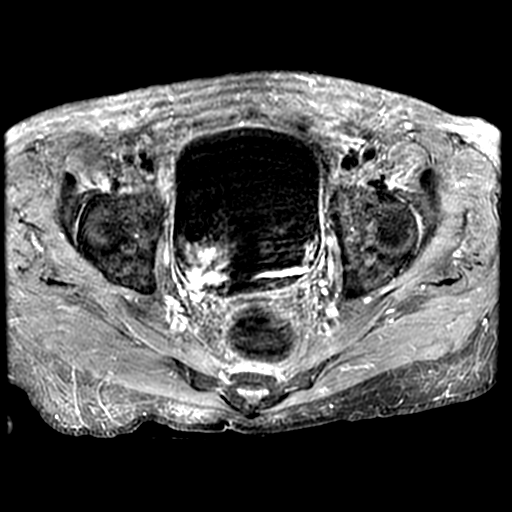
[im 38/46]
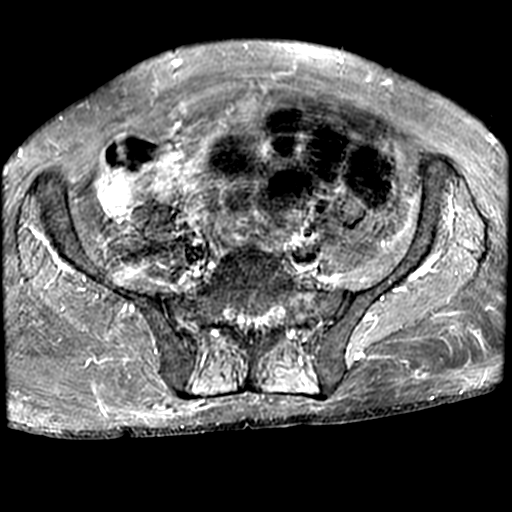

[19 of 48 positions shown; findings below may reference images not displayed]

FINDINGS: Despite efforts by the technologist and patient, mild to moderate
motion artifact is present on today's exam and could not be
eliminated. This reduces exam sensitivity and specificity.

Bones/Joint/Cartilage

There is a deep soft tissue ulcer over the distal coccyx which
extends to bone. There is associated abnormal decreased T1 and
increased T2 marrow signal within the distal 2 coccygeal segments
with diffuse marrow enhancement following contrast, consistent with
osteomyelitis. The sacrum, sacroiliac joints and remainder of the
visualized bony pelvis appear unremarkable. No significant femoral
head or hip joint abnormalities.

Ligaments

Not relevant for exam/indication.

Muscles and Tendons
Mild generalized muscular T2 hyperintensity without focal fluid
collection or abnormal enhancement.

Soft tissue
As above, deep soft tissue decubitus ulcer over the distal coccyx
with surrounding subcutaneous enhancement. No drainable fluid
collection. Enhancement extends into the presacral space without
associated focal fluid collection. Mild generalized subcutaneous
edema, similar to previous CT. Previously demonstrated bladder
distension appears mildly improved.
IMPRESSION: 1. Deep decubitus ulcer over the distal coccyx with associated
marrow changes and enhancement in the distal 2 coccygeal segments
consistent with osteomyelitis.
2. The sacrum, sacroiliac joints and remainder of the visualized
bony pelvis appear unremarkable.
3. No drainable abscess identified.

## 2021-09-18 MED ORDER — STERILE WATER FOR INJECTION IJ SOLN
INTRAMUSCULAR | Status: AC
  Administered 2021-09-18: 0.5 mL via INTRAVENOUS
  Filled 2021-09-18: qty 10

## 2021-09-18 MED ORDER — OXYCODONE HCL 5 MG PO TABS: 15.0000 mg | ORAL_TABLET | Freq: Once | ORAL | Status: DC

## 2021-09-18 MED ORDER — DAKINS (1/4 STRENGTH) 0.125 % EX SOLN
Freq: Two times a day (BID) | CUTANEOUS | Status: DC
  Administered 2021-09-18 – 2021-09-19 (×2): 1
  Filled 2021-09-18 (×2): qty 473

## 2021-09-18 MED ORDER — ALTEPLASE 2 MG IJ SOLR
2.0000 mg | Freq: Once | INTRAMUSCULAR | Status: AC
  Administered 2021-09-18: 2 mg
  Filled 2021-09-18: qty 2

## 2021-09-18 MED ORDER — GADOBUTROL 1 MMOL/ML IV SOLN
8.0000 mL | Freq: Once | INTRAVENOUS | Status: AC | PRN
  Administered 2021-09-18: 8 mL via INTRAVENOUS

## 2021-09-18 MED ORDER — SODIUM CHLORIDE 0.9% FLUSH: 10.0000 mL | INTRAVENOUS | Status: DC | PRN

## 2021-09-18 MED ORDER — LORAZEPAM 2 MG/ML IJ SOLN
1.0000 mg | Freq: Once | INTRAMUSCULAR | Status: AC
  Administered 2021-09-18: 1 mg via INTRAVENOUS
  Filled 2021-09-18: qty 1

## 2021-09-18 MED ORDER — OXYCODONE HCL 5 MG PO TABS
10.0000 mg | ORAL_TABLET | Freq: Once | ORAL | Status: AC
  Administered 2021-09-18: 10 mg via ORAL
  Filled 2021-09-18: qty 2

## 2021-09-18 MED ORDER — POLYETHYLENE GLYCOL 3350 17 G PO PACK: 17.0000 g | PACK | Freq: Every day | ORAL | Status: DC | PRN

## 2021-09-18 NOTE — Progress Notes (Signed)
Per Merrily Pew RN, therapy will be completed after 1530 today. Goal is to exchange PICC sometime this afternoon.

## 2021-09-18 NOTE — Progress Notes (Signed)
Occupational Therapy Weekly Progress Note  Patient Details  Name: Jose [name] MRN: [id number] Date of Birth: 1976/01/14  Beginning of progress report period: September 09, 2021 End of progress report period: September 18, 2021    Patient has met 2 of 3 short term goals.  The pt has shown good progress toward OT goals by improving bed mobility to CGA/Supervision to participate in ADL tasks, increasing OOB tolerance to sitting up between therapy sessions for short periods of time, ambulating to/from bathroom with CGA and RW. Pt still requires Mod/Max for LB dressing 2/2 back precautions and pain, Min/Mod for LB bathing at sink level. Pt is now performing oral hygiene, and bathing/dressing tasks at sink level when pain allows. Planning for family ed with wife Jose [name] in upcoming sessions.   Patient continues to demonstrate the following deficits: muscle weakness, decreased cardiorespiratoy endurance, impaired timing and sequencing, unbalanced muscle activation, decreased coordination, and decreased motor planning, decreased motor planning, and decreased sitting balance, decreased standing balance, decreased postural control, and decreased balance strategies and therefore will continue to benefit from skilled OT intervention to enhance overall performance with BADL and Reduce care partner burden.  Patient progressing toward long term goals..  Continue plan of care.  OT Short Term Goals Week 2:  OT Short Term Goal 1 (Week 2): Pt will perform LB dressing bed level with Mod A and using AE OT Short Term Goal 1 - Progress (Week 2): Progressing toward goal OT Short Term Goal 2 (Week 2): Pt will perform BSC/toilet transfer with LRAD and CGA OT Short Term Goal 2 - Progress (Week 2): Met OT Short Term Goal 3 (Week 2): Pt will sit EOB or in wheelchair during ADL for 10 minutes while performing a task OT Short Term Goal 3 - Progress (Week 2): Met Week 3:  OT Short Term Goal 1 (Week 3): STGs = LTGs 2/2  LOS    Therapy Documentation Precautions:  Precautions Precautions: Fall, Back Precaution Booklet Issued:  (verbally reviewed post-up back precautions) Precaution Comments: 2L oxygen, high pain levels, PICC line Restrictions Weight Bearing Restrictions: No     Therapy/Group: Individual Therapy  Viona Gilmore 09/18/2021, 7:17 AM

## 2021-09-18 NOTE — Progress Notes (Signed)
Peripherally Inserted Central Catheter Placement  The IV Nurse has discussed with the patient and/or persons authorized to consent for the patient, the purpose of this procedure and the potential benefits and risks involved with this procedure.  The benefits include less needle sticks, lab draws from the catheter, and the patient may be discharged home with the catheter. Risks include, but not limited to, infection, bleeding, blood clot (thrombus formation), and puncture of an artery; nerve damage and irregular heartbeat and possibility to perform a PICC exchange if needed/ordered by physician.  Alternatives to this procedure were also discussed.  Bard Power PICC patient education guide, fact sheet on infection prevention and patient information card has been provided to patient /or left at bedside.    PICC Placement Documentation  PICC Triple Lumen 08/21/21 PICC Right Brachial 40 cm 0 cm (Active)  Indication for Insertion or Continuance of Line Prolonged intravenous therapies 09/18/21 0458  Exposed Catheter (cm) 0 cm 09/17/21 0748  Site Assessment Dry;Intact;Clean 09/18/21 0546  Lumen #1 Status Blood return noted;Flushed;Saline locked 09/18/21 1113  Lumen #2 Status Saline locked;Flushed;Blood return noted 09/18/21 0546  Lumen #3 Status Flushed;Saline locked;Blood return noted 09/18/21 0837  Dressing Type Transparent;Securing device 09/17/21 0748  Dressing Status Clean;Dry;Intact 09/18/21 0458  Antimicrobial disc in place? Yes 09/18/21 0458  Safety Lock Not Applicable 65/03/54 6568  Line Care Lumen 1 cap changed 09/18/21 1113  Line Adjustment (NICU/IV Team Only) No 09/16/21 0045  Dressing Intervention Dressing changed 09/17/21 0748  Dressing Change Due 09/24/21 09/18/21 0458       Keeli Roberg, Nicolette Bang 09/18/2021, 4:57 PM

## 2021-09-18 NOTE — Progress Notes (Signed)
Physical Therapy Wound Treatment Patient Details  Name: Jose [name] MRN: [id number] Date of Birth: June 23, 1976  Today's Date: 09/18/2021 PT Individual Time: 1001-1045 PT Individual Time Calculation (min): 44 min   Subjective  Subjective: Still pretty sore Patient and Family Stated Goals: get the wound healed and do well with therapies. Date of Onset:  (unknown) Prior Treatments:  (unknown)  Pain Score: Pain Score: 6   Wound Assessment  Pressure Injury 08/10/21 Coccyx Medial Unstageable - Full thickness tissue loss in which the base of the injury is covered by slough (yellow, tan, gray, green or brown) and/or eschar (tan, brown or black) in the wound bed. (Active)  Wound Image   09/18/21 1240  Dressing Type Barrier Film (skin prep);Foam - Lift dressing to assess site every shift;Gauze (Comment);Moist to dry;Normal saline moist dressing;Santyl 09/18/21 1240  Dressing Changed 09/18/21 1240  Dressing Change Frequency Twice a day 09/18/21 1240  State of Healing Early/partial granulation 09/18/21 1240  Site / Wound Assessment Yellow;Red 09/18/21 1240  % Wound base Red or Granulating 50% 09/18/21 1240  % Wound base Yellow/Fibrinous Exudate 50% 09/18/21 1240  % Wound base Black/Eschar 0% 09/18/21 1240  % Wound base Other/Granulation Tissue (Comment) 0% 09/18/21 1240  Peri-wound Assessment Intact 09/16/21 2052  Wound Length (cm) 7 cm 09/16/21 2052  Wound Width (cm) 3.5 cm 09/16/21 2052  Wound Depth (cm) 3 cm 09/16/21 2052  Wound Surface Area (cm^2) 24.5 cm^2 09/16/21 2052  Wound Volume (cm^3) 73.5 cm^3 09/16/21 2052  Margins Unattached edges (unapproximated) 09/18/21 1240  Drainage Amount Minimal 09/18/21 1240  Drainage Description Serous 09/18/21 1240  Treatment Cleansed;Debridement (Selective);Hydrotherapy (Pulse lavage);Packing (Saline gauze) 09/18/21 1240      Hydrotherapy Pulsed lavage therapy - wound location: sacral/coccyx Pulsed Lavage with Suction (psi): 8 psi  (4-12) Pulsed Lavage with Suction - Normal Saline Used: 1000 mL Pulsed Lavage Tip: Tip with splash shield Selective Debridement Selective Debridement - Location: sacral/coccyx Selective Debridement - Tools Used: Scalpel;Forceps Selective Debridement - Tissue Removed: Thick yellow eschar covering wound base   Wound Assessment and Plan  Wound Therapy - Assess/Plan/Recommendations Wound Therapy - Clinical Statement: Wound improving slowly. Yellow slough and scar tissue remains adherent with difficult debridement.. Pt will benefit from further selective debridement and Santyl to remove necrotic tissue.  DAKIN doing it's job with normalizing exudate. Wound Therapy - Functional Problem List: pain limiting bed mobility and OOB activity. Factors Delaying/Impairing Wound Healing: Diabetes Mellitus;Multiple medical problems Hydrotherapy Plan: Debridement;Dressing change;Pulsatile lavage with suction;Patient/family education Wound Therapy - Frequency: 3X / week Wound Therapy - Current Recommendations: PT Wound Therapy - Follow Up Recommendations: dressing changes by RN;dressing changes by family/patient  Wound Therapy Goals- Improve the function of patient's integumentary system by progressing the wound(s) through the phases of wound healing (inflammation - proliferation - remodeling) by: Decrease Necrotic Tissue to: 20% Decrease Necrotic Tissue - Progress: Progressing toward goal Increase Granulation Tissue to: 80% Increase Granulation Tissue - Progress: Progressing toward goal Improve Drainage Characteristics: Min;Serous Improve Drainage Characteristics - Progress: Progressing toward goal Goals/treatment plan/discharge plan were made with and agreed upon by patient/family: Yes Time For Goal Achievement: 7 days Wound Therapy - Potential for Goals: Good  Goals will be updated until maximal potential achieved or discharge criteria met.  Discharge criteria: when goals achieved, discharge from  hospital, MD decision/surgical intervention, no progress towards goals, refusal/missing three consecutive treatments without notification or medical reason.  GP     Tessie Fass Dann Ventress 09/18/2021, 12:44 PM 09/18/2021  Ginger Carne., PT  Ac[facility] Services [phone]  (pager) [phone]  (office)

## 2021-09-18 NOTE — Progress Notes (Signed)
Patient ID: MIRZA FESSEL, male   DOB: 1976/01/04, 45 y.o.   MRN: [id number] Pt has all equipment from previous admits and family education set up for Sat at 9:00 with PT. RN aware will need to educate on dressing changes for bottom. [facility] to see for IV antibiotics two times then discharge will not address wound. Has had [facility] before and wife felt no education needed regarding IV antibiotics due to has done this when home before. Wife continues to express concerns regarding wound care and if can do this. Have scheduled him for wound clinic but does not have an appointment until 12/29. No home health agency will accept referral for wound care and therapies. Will need to be referred to OP therapies. Have faxed order to OP and will contact pt regarding setting up appointments. Work toward discharge on Monday

## 2021-09-18 NOTE — Progress Notes (Signed)
PROGRESS NOTE   Subjective/complaints:   Pt  reports 3 stools yesterday in spite of stopping senokot-  Slept well-  ROS:   Pt denies SOB, abd pain, CP, N/V/C/D, and vision changes    Objective:   No results found. No results for input(s): WBC, HGB, HCT, PLT in the last 72 hours.   No results for input(s): NA, K, CL, CO2, GLUCOSE, BUN, CREATININE, CALCIUM in the last 72 hours.    Intake/Output Summary (Last 24 hours) at 09/18/2021 1021 Last data filed at 09/18/2021 0700 Gross per 24 hour  Intake 364 ml  Output 2231 ml  Net -1867 ml     Pressure Injury 08/10/21 Coccyx Medial Unstageable - Full thickness tissue loss in which the base of the injury is covered by slough (yellow, tan, gray, green or brown) and/or eschar (tan, brown or black) in the wound bed. (Active)  08/10/21 1700  Location: Coccyx (gluteal crease)  Location Orientation: Medial  Staging: Unstageable - Full thickness tissue loss in which the base of the injury is covered by slough (yellow, tan, gray, green or brown) and/or eschar (tan, brown or black) in the wound bed.  Wound Description (Comments):   Present on Admission: Yes    Physical Exam: Vital Signs Blood pressure 129/77, pulse 66, temperature 98.7 F (37.1 C), temperature source Oral, resp. rate 18, height _0  (1.803 m), weight 80 kg, SpO2 92 %.       General: awake, alert, appropriate, laying supine in bed- like always- advised to turn on side as much as possible; NAD HENT: conjugate gaze; oropharynx moist CV: regular rate; no JVD Pulmonary: CTA B/L; no W/R/R- good air movement GI: soft, NT, ND, (+)BS Psychiatric: appropriate; but tremors notable due to anxiety Neurological: Ox3 Skin; coccyx ulcer- ~ 50% slough noted, but see some mild granulation- can see lump of coccyx covered with tissue, but cannot tell how much slough vs tissue Musc: No edema in extremities.  No tenderness  in extremities. Neuro: Alert Motor: Strength is 5/5 in bilateral deltoid, bicep, tricep, grip, 4-/5 hip flexor, knee extensors, 3/5 ankle dorsiflexor and plantar flexor, improving  Assessment/Plan: 1. Functional deficits which require 3+ hours per day of interdisciplinary therapy in a comprehensive inpatient rehab setting. Physiatrist is providing close team supervision and 24 hour management of active medical problems listed below. Physiatrist and rehab team continue to assess barriers to discharge/monitor patient progress toward functional and medical goals  Care Tool:  Bathing    Body parts bathed by patient: Right arm, Left arm, Chest, Abdomen, Front perineal area, Face, Right upper leg, Left upper leg   Body parts bathed by helper: Buttocks, Right lower leg, Left lower leg     Bathing assist Assist Level: Moderate Assistance - Patient 50 - 74%     Upper Body Dressing/Undressing Upper body dressing   What is the patient wearing?: Pull over shirt    Upper body assist Assist Level: Moderate Assistance - Patient 50 - 74%    Lower Body Dressing/Undressing Lower body dressing      What is the patient wearing?: Pants, Underwear/pull up     Lower body assist Assist for lower body dressing:  Maximal Assistance - Patient 25 - 49%     Toileting Toileting Toileting Activity did not occur Landscape architect and hygiene only): N/A (no void or bm) (pt had to be cathed)  Toileting assist Assist for toileting: Maximal Assistance - Patient 25 - 49%     Transfers Chair/bed transfer  Transfers assist  Chair/bed transfer activity did not occur: Safety/medical concerns  Chair/bed transfer assist level: Contact Guard/Touching assist     Locomotion Ambulation   Ambulation assist   Ambulation activity did not occur: Safety/medical concerns  Assist level: Contact Guard/Touching assist Assistive device: Walker-rolling Max distance: 400 ft   Walk 10 feet  activity   Assist  Walk 10 feet activity did not occur: Safety/medical concerns  Assist level: Contact Guard/Touching assist Assistive device: Walker-rolling   Walk 50 feet activity   Assist Walk 50 feet with 2 turns activity did not occur: Safety/medical concerns  Assist level: Contact Guard/Touching assist Assistive device: Walker-rolling    Walk 150 feet activity   Assist Walk 150 feet activity did not occur: Safety/medical concerns  Assist level: 2 helpers Assistive device: Walker-rolling    Walk 10 feet on uneven surface  activity   Assist Walk 10 feet on uneven surfaces activity did not occur: Safety/medical concerns         Wheelchair     Assist Is the patient using a wheelchair?: No Type of Wheelchair: Manual Wheelchair activity did not occur: Safety/medical concerns         Wheelchair 50 feet with 2 turns activity    Assist    Wheelchair 50 feet with 2 turns activity did not occur: Safety/medical concerns       Wheelchair 150 feet activity     Assist  Wheelchair 150 feet activity did not occur: Safety/medical concerns       Blood pressure 129/77, pulse 66, temperature 98.7 F (37.1 C), temperature source Oral, resp. rate 18, height _0  (1.803 m), weight 80 kg, SpO2 92 %.    Medical Problem List and Plan: 1.  Decline in self care and mobility  secondary to thoracic myelopathy/thoracic osteomyelitis.   11/22- con't PT/OT/CIR- team conference today- will con't d/c date due to hydrotherapy. 11/23- dC date 11/28 due to function, but still has wound issues, so cannot move up easily.    11/24- con't CIR_ PT and OT and wound care 11/25- Con't PT and OT_ family training tomorrow-  2.  Antithrombotics: -DVT/anticoagulation:  N/A- due to GI bleed             -antiplatelet therapy: none 3. Pain Management: Oxycodone prn. Increased oxy to q4 hours prn- might need long acting?   Added MS Contin 15 mg BID for pain- and  monitor  11/24- pain controlled- con't regimen Controlled with meds on 11/19  4. Mood: LCSW to follow for evaluation and support.   11/23- anxiety- will add Paxil 20 mg QHS and maintain Xanax- don't want to increase/ change Xanax due to being on pain meds- went over with pt about this issue.   11/24- no side effects from Paxil- con't regimen  11/25- still having tremors/shaking from anxiety, so hasn't kicked in yet.              -antipsychotic agents: N/A 5. Neuropsych: This patient is intermittently capable of making decisions on his own behalf. 6. Skin/Wound Care: Air mattress for pressure relief measures.   Hydrotherapy to wound  11/21- has 95% of slough per [place]- wound opened up- hydrotherapy  with Annitta Needs is the order- WOC to follow weekly.   11/25- will order MRI of coccyx/sacrum to look for abscess- called ID and we agreed- will call back after MRI results done.  7. Fluids/Electrolytes/Nutrition: Monitor I/Os 8. MSSA bacteremia: On ancef with end date 12/01  11/24- weekly labs looking OK. Will double check ESR/CRP since has wound.  9. UGIB/melena: Has received 11 units PRBC during admission --Continue protonix BID w/ carafate ac/hs.  --Monitor for signs of bleeding. Hemoglobin 8.4 on 11/16, labs ordered for Monday  11/21- Hb 9.0- doing better- con't to monitor 10. T2DM: Po intake poor --continue TF at nights. Continue insulin gargline 12 units at nights Slightly labile on 11/19, monitor for trend.   11/21- BG's variable- will monitor for trend  11/22- Bgs doing a little better- drinking a lot of apple juice- likely the cause 11/23- saw 4 apple juice cups on tray- likely the cause- advised ot decrease juice intake.   11/24- Bgs labile- asked pt again to avoid juices 11. HTN: Monitor BP TID--continue Norvasc and metoprolol for control.   11/23- occ mild elevation- will con't regimen for now and follow trneds 12. ABLA: On aranesp weekly for supplement.  See #9 13. Acute renal  insufficiency- Creatinine 1.41 on 11/17, labs ordered for Monday  11/21- Cr 1.23- and BUN 14- con't to monitor 14.  Supplemental oxygen dependent/acute resp failure  11/7- will wean O2 as able 15. Pulmonary edema vs B/L pneumonia  11/9- will give Lasix 40 mg daily x 3 days and KCL 40 mEq x3 days as well since K+ is borderline low.   11/15- CXR looks worse- B/L pulmonary edema vs pneumonia, hospitalist consulted  11/16- pulmonary effusions- due to bedrest- asked for flutter valve and pt to sit up as much as possible. Considering unstageable wound on backside  11/17- pt using flutter valve and ICS0 encouraged pt to use q1 hour while awake.   11/22- down to 0.5 L of O2- will con't to wean  11/23- off O2! 16. Urinary retention- 11/8-started Flomax- foley out- in/out caths  11/24- is refusing caths when PVRs 350cc- will d/w pt.  17. Pressure ulcer on sacrum/coccyx- unstageable  11/11- ordered WOC_ they are starting hydrotherapy M/W/F and wound care by nursing with santyl and saline gauze and foam dressing.   11/12- hydrotherapy went well  11/21- going well- still 95% slough- con't regimen  11/22- con't hydrotherapy 3 days/week  11/24- no progressing per wound care- surgery said no intervention- will d/w pt about calling ID  11/25- called ID- getting sacral MRI with and without contrast- need to call ID back with results.  18.  Hyponatremia  Sodium 131 on 11/17, labs ordered for Monday  11/21- Na 134- con't to monitor 19. Bowel incontinence/frequency- will sopt Senokot- con't miralax.   11/24- no better yet- will con't new regimen  11/25- will make miralax prn   I spent a total of 44 minutes on total care- >50% coordination of care- d/w wound care PT as well as ID and radiology.    LOS: 19 days A FACE TO FACE EVALUATION WAS PERFORMED  Rykin Route 09/18/2021, 10:21 AM

## 2021-09-18 NOTE — Progress Notes (Signed)
Physical Therapy Session Note  Patient Details  Name: TOBIN WITUCKI MRN: [id number] Date of Birth: Apr 12, 1976  Today's Date: 09/18/2021  Short Term Goals: Week 3:  PT Short Term Goal 1 (Week 3): =LTGs d/t d/c date  Skilled Therapeutic Interventions/Progress Updates: Pt missed 75 min skilled PT due to MRI. Will continue efforts as schedule permits.      Therapy Documentation Precautions:  Precautions Precautions: Fall, Back Precaution Booklet Issued:  (verbally reviewed post-up back precautions) Precaution Comments: 2L oxygen, high pain levels, PICC line Restrictions Weight Bearing Restrictions: No General: PT Amount of Missed Time (min): 75 Minutes Vital Signs:   Pain:   Mobility:   Locomotion :    Trunk/Postural Assessment :    Balance:   Exercises:   Other Treatments:      Therapy/Group: Individual Therapy  Talena Neira 09/18/2021, 2:46 PM

## 2021-09-18 NOTE — Consult Note (Signed)
[place] Nurse wound follow up [place] re-ordered today x 5 days bid. Hydrotherapy continued M/W/F. Bedside nurse to change dressings all other times.   Please re-consult if needed.  Cathlean Marseilles Tamala Julian, MSN, RN, [place], Lysle Pearl, [facility] Wound Treatment Associate Pager [phone]

## 2021-09-18 NOTE — Progress Notes (Signed)
Occupational Therapy Session Note  Patient Details  Name: Oma K Schmuhl MRN: [id number] Date of Birth: 05/24/1976  Today's Date: 09/18/2021 OT Individual Time: 0835-0850 (AM) and 1300-1353 (PM) OT Individual Time Calculation (min): 15 min  and 53 min Today's Date: 09/18/2021 OT Missed Time: 15 Minutes (in AM session) Missed Time Reason: Nursing care   Short Term Goals: Week 1:  OT Short Term Goal 1 (Week 1): Pt will tolerate sitting EOB ~10 minutes during an ADL session to demonstrate improvement in pain mgt OT Short Term Goal 1 - Progress (Week 1): Progressing toward goal OT Short Term Goal 2 (Week 1): Pt will complete 1/3 components of donning pants with AE as needed OT Short Term Goal 2 - Progress (Week 1): Met OT Short Term Goal 3 (Week 1): Pt will complete LB dressing at sit<stand level using LRAD OT Short Term Goal 3 - Progress (Week 1): Progressing toward goal Week 2:  OT Short Term Goal 1 (Week 2): Pt will perform LB dressing bed level with Mod A and using AE OT Short Term Goal 2 (Week 2): Pt will perform BSC/toilet transfer with LRAD and CGA OT Short Term Goal 3 (Week 2): Pt will sit EOB or in wheelchair during ADL for 10 minutes while performing a task   Skilled Therapeutic Interventions/Progress Updates:    Session 1: Pt greeted at time of session with IV nursing team finishing up PICC line change. Missed a few minutes at beginning of session for IV dressing change. Resumed session and Focused on BLE AAROM as a warm up 2/2 limited time and pt needing to be in bed at end of session for hydro. Pt performed assisted 1x10 of the following with passive stretch at end range: single leg lift, hip/knee flexion, calf stretch. MD and nursing staff entered at this time for wound inspection and dressing change. Missed 15 minutes total 2/2 nursing care.   Session 2: Pt greeted at time of session supine in bed resting, pt and nursing staff stating that the pt is expected to have MRI at any  time. Offered pt OOB activity, functional mobility, toileting, ADL but politely declined all. Pt stating he wanted to remain bed level in prep for MRI and to save energy for later PT session. Discussion with pt regarding DC planning, provided with walker bag to problem solve transportation of items. Remainder of session focused on BLE ROM for the following AAROM with static hold at end range for 2x10: single leg lifts, hip abduction/adduction, and hip/knee flexion with leg lifter when applicable to strengthen and maximize carryover for home use. BUE ROM for  2x10-15 reps with varying levels of resistance accordingly: chest press, overhead raise, bicep curl, ER/IR, and shoulder rotations. At end of session, transportation service present to take patient to MRI. Hand off to nursing.   Therapy Documentation Precautions:  Precautions Precautions: Fall, Back Precaution Booklet Issued:  (verbally reviewed post-up back precautions) Precaution Comments: 2L oxygen, high pain levels, PICC line Restrictions Weight Bearing Restrictions: No    Therapy/Group: Individual Therapy   C  09/18/2021, 7:17 AM 

## 2021-09-18 NOTE — Progress Notes (Signed)
Occupational Therapy Session Note  Patient Details  Name: Jose [name] MRN: [id number] Date of Birth: 20-Apr-1976  Today's Date: 09/19/2021 OT Individual Time: 1400-1510 OT Individual Time Calculation (min): 70 min   Skilled Therapeutic Interventions/Progress Updates:    Pt greeted in bed, premedicated for pain. He reports that he is still implementing mindfulness/relaxation techniques into his routine to help manage his anxiety and increase overall participation. Pt reported feeling down due to osteomyelitis spreading to sacral wound. Discussed psychosocial implications, focusing on using his medical journey as a source of empowerment and inspiration to others, pt appearing to receptive to this as he is a motivational speaker by trade. Set pt up with leg lifter to complete LE stretches in bed to prep him for ambulation. Supine<Sit completed with setup given increased time. CGA for sit<stand and then pt ambulated ~360 ft using RW with CGA-supervision assist to the dayroom. We celebrated! After a short rest, pt then ambulated back to the room. Vcs for improving upright posture during mobility. He returned to bed and was assisted in sidelying position for pressure relief. Left pt in bed with all needs within reach. Tx focus placed on dynamic balance, activity tolerance, and OOB tolerance.   Therapy Documentation Precautions:  Precautions Precautions: Fall, Back Precaution Booklet Issued:  (verbally reviewed post-up back precautions) Precaution Comments: 2L oxygen, high pain levels, PICC line Restrictions Weight Bearing Restrictions: No Vital Signs: Therapy Vitals Temp: 97.6 F (36.4 C) Pulse Rate: 64 Resp: 18 BP: (!) 144/71 Patient Position (if appropriate): Lying Oxygen Therapy SpO2: 100 % O2 Device: Room Air ADL: ADL Eating: Dependent (per most recent staff documentation) Grooming: Maximal assistance Where Assessed-Grooming: Edge of bed Upper Body Bathing:  Supervision/safety Where Assessed-Upper Body Bathing: Edge of bed Lower Body Bathing: Maximal assistance Where Assessed-Lower Body Bathing: Edge of bed, Bed level Upper Body Dressing: Maximal assistance Where Assessed-Upper Body Dressing: Edge of bed Lower Body Dressing: Maximal assistance, Dependent Where Assessed-Lower Body Dressing: Bed level Toileting: Not assessed Toilet Transfer: Not assessed Tub/Shower Transfer: Not assessed ADL Comments: unable to assess transfer abilities due to heightened pain, very limited tolerance sitting EOB due to pain   Therapy/Group: Individual Therapy  Emelio Schneller A Cortlyn Cannell 09/19/2021, 4:22 PM

## 2021-09-19 LAB — GLUCOSE, CAPILLARY
Glucose-Capillary: 97 mg/dL (ref 70–99)
Glucose-Capillary: 165 mg/dL — ABNORMAL HIGH (ref 70–99)
Glucose-Capillary: 57 mg/dL — ABNORMAL LOW (ref 70–99)
Glucose-Capillary: 68 mg/dL — ABNORMAL LOW (ref 70–99)
Glucose-Capillary: 186 mg/dL — ABNORMAL HIGH (ref 70–99)

## 2021-09-19 NOTE — Progress Notes (Signed)
Hypoglycemic Event  CBG: 57  Treatment: 4 oz juice/soda  Symptoms: Sweaty  Follow-up CBG: Time:1630 CBG Result:68  Possible Reasons for Event: Inadequate meal intake  Comments/MD notified:On call Md notified     Merwyn Katos

## 2021-09-19 NOTE — Progress Notes (Shared)
Occupational Therapy Discharge Summary  Patient Details  Name: Jose [name] MRN: [id number] Date of Birth: 04/19/1976  ***Note I filled out caretool and ADL portion at [place] [facility] for LB ADL, if you feel like he is Supervision please feel free to update ***   Patient has met {NUMBERS 0-12:18577} of {NUMBERS 0-12:18577} long term goals due to improved activity tolerance, improved balance, postural control, ability to compensate for deficits, functional use of  RIGHT upper extremity, improved awareness, and improved coordination.  Patient to discharge at overall Supervision - Min A level.  Patient's care partner is independent to provide the necessary physical assistance at discharge.  Wife Junie Panning completed family ed on 11/26. Note pt has been significantly limited by pain during hospitalization but has made progress toward OT goals completing functional mobility with Supervision with RW, on room air for ADL tasks, performing UB/LB bathing and dressing with Supervision - Min A (fluctuates pending pain).   Reasons goals not met: ***  Recommendation:  Patient will benefit from ongoing skilled OT services in outpatient setting to continue to advance functional skills in the area of BADL, iADL, and Reduce care partner burden.  Equipment: BSC, pt to purchase TTB later on when able to shower  Reasons for discharge: treatment goals met and discharge from hospital  Patient/family agrees with progress made and goals achieved: Yes  OT Discharge Precautions/Restrictions  Precautions Precautions: Fall;Back Restrictions Weight Bearing Restrictions: No Vital Signs Therapy Vitals Temp: 97.6 F (36.4 C) Pulse Rate: 64 Resp: 18 BP: (!) 144/71 Patient Position (if appropriate): Lying Oxygen Therapy SpO2: 100 % O2 Device: Room Air Pain Pain Assessment Pain Scale: 0-10 Pain Score: 4  ADL ADL Eating: Not assessed Grooming: Supervision/safety Where Assessed-Grooming: Edge of bed Upper Body  Bathing: Supervision/safety Where Assessed-Upper Body Bathing: Edge of bed Lower Body Bathing: Minimal assistance (with LHS at sink level) Where Assessed-Lower Body Bathing: Edge of bed, Bed level Upper Body Dressing: Setup Where Assessed-Upper Body Dressing: Edge of bed Lower Body Dressing: Minimal assistance Where Assessed-Lower Body Dressing: Bed level Toileting: Minimal assistance Toilet Transfer: Close supervision Toilet Transfer Method: Ambulating Tub/Shower Transfer: Not assessed ADL Comments: unable to assess transfer abilities due to heightened pain, very limited tolerance sitting EOB due to pain Vision Baseline Vision/History: 1 Wears glasses;6 Macular Degeneration;5 Retinopathy Patient Visual Report: No change from baseline Vision Assessment?: No apparent visual deficits Perception  Perception: Within Functional Limits Praxis Praxis: Intact Cognition Overall Cognitive Status: Within Functional Limits for tasks assessed Arousal/Alertness: Awake/alert Orientation Level: Oriented X4 Year: 2022 Month: November Day of Week: Correct Memory: Appears intact Awareness: Appears intact Problem Solving: Appears intact Safety/Judgment: Impaired Sensation Sensation Light Touch: Impaired by gross assessment Additional Comments: bilateral neuropathy. Decreaed sensation distally > proximally and worse on the L > R Coordination Gross Motor Movements are Fluid and Coordinated: No Fine Motor Movements are Fluid and Coordinated: No Coordination and Movement Description: Guarded functional movements due to pain but greatly improved, bilateral hand tremors with activity Finger Nose Finger Test: Shakiness which pt attributes to feelings of anxiety Motor  Motor Motor: Abnormal postural alignment and control Motor - Skilled Clinical Observations: Global weakness and deconditioning 2/2 prolonged and complicated hospital course. Pain limiting but significantly improved Mobility  Bed  Mobility Sit to Supine: Supervision/Verbal cueing Transfers Sit to Stand: Supervision/Verbal cueing Stand to Sit: Supervision/Verbal cueing  Trunk/Postural Assessment  Cervical Assessment Cervical Assessment: Exceptions to [facility] Thoracic Assessment Thoracic Assessment: Exceptions to [facility] Lumbar Assessment Lumbar Assessment: Exceptions to [facility] Postural  Control Postural Control: Deficits on evaluation  Balance Balance Balance Assessed: Yes Static Sitting Balance Static Sitting - Balance Support: Feet supported Static Sitting - Level of Assistance: 6: Modified independent (Device/Increase time) Static Standing Balance Static Standing - Balance Support: During functional activity Static Standing - Level of Assistance: 5: Stand by assistance Dynamic Standing Balance Dynamic Standing - Balance Support: During functional activity;Bilateral upper extremity supported Dynamic Standing - Level of Assistance: 5: Stand by assistance Dynamic Standing - Balance Activities: Forward lean/weight shifting;Lateral lean/weight shifting;Reaching for objects Extremity/Trunk Assessment RUE Assessment RUE Assessment: Exceptions to [facility] General Strength Comments: significnatly improved pain in R shoulder, able to use functionally but pain >90* LUE Assessment LUE Assessment: Exceptions to [facility] Active Range of Motion (AROM) Comments: WFL General Strength Comments: generalized weakness present but greatly improved functional use from eval   Viona Gilmore 09/19/2021, 4:49 PM

## 2021-09-19 NOTE — Progress Notes (Signed)
Physical Therapy Session Note  Patient Details  Name: Jose [name] MRN: [id number] Date of Birth: 1976-07-15  Today's Date: 09/19/2021 PT Individual Time: [phone] and [phone] PT Individual Time Calculation (min): 20 min and 36 min   Short Term Goals: Week 3:  PT Short Term Goal 1 (Week 3): =LTGs d/t d/c date  Skilled Therapeutic Interventions/Progress Updates:     Patient in bed with his wife present for family education upon PT arrival. Patient alert and agreeable to PT session. Patient reported 4/10 back and sacral pain during session, RN made aware. PT provided repositioning, rest breaks, and distraction as pain interventions throughout session.   Reviewed home set-up with patient and his wife. Patient reports improved mobility with lower extremity stretching prior to mobility. Reviewed calf and hamstring stretching and reviewed benefits of soft tissue mobilization/massage.   MD rounded during stretching and informed the patient and his wife of MRI results. Patient and his wife requested time to discuss MRI results and MD made aware that they had continued questions about expectations for treatment plan.   Upon PT return, patient and his wife agreeable to continue family education.   Therapeutic Activity: Bed Mobility: Patient performed supine to sit with mod I for increased time and use of hospital bed features. Patient 's wife confirms that they already have a hospital bed at home. Discussed adding gel overlay for improved pressure relief, will pass on recommendation to CSW, patient and his wife appreciative.  Transfers: Patient performed sit to/from stand x2 with supervision using RW. Provided verbal cues for forward weight shift x1. Discussed elevated truck height car transfer with use of running board using handle grip. Patient reports simulation has been done and both feel confident that the patient can perform this as he has previously, declined simulating car transfer at  this time.   Gait Training:  Patient ambulated 174 feet using RW with supervision, educated standing within arms reach for safety. Ambulated with decreased gait speed, decreased step length and height, forward trunk lean, and downward head gaze.Marland Kitchen Provided verbal cues for erect posture, proximity to RW x1, and looking ahead. PT demonstrated 2 techniques for stair navigation to enter home. Have 3 STE with R wall, demonstrated going up backwards with RW and HHA on L. Patient ascended/descended 3x6" steps using RW with CGA. Performed step-to gait pattern leading with L backwards while ascending and R forwards while descending. Provided demonstration and cues for technique and sequencing. Patient and his wife stated they feel confident in his ability to perform this at home and that they will have 2 people present at d/c to assist. Declined performing with his wife at this time.   Educated patient on fall risk/prevention, home modifications to prevent falls, and activation of emergency services in the event of a fall during session.   Recommending 18"x18" light weight wheelchair for energy conservation with community mobility and Roho cushion for pressure relief and improved sitting tolerance due to sacral wound. Reviewed air pressure and maintenance of Roho cushion and need for pressure relief every 30 min in sitting, recommending using standing x1 min for full reduction of sacral pressure. Patient and his wife appreciative and receptive to all education.   Patient in w/c handed off to [place], [place], at end of session.   Therapy Documentation Precautions:  Precautions Precautions: Fall, Back Precaution Booklet Issued:  (verbally reviewed post-up back precautions) Precaution Comments: 2L oxygen, high pain levels, PICC line Restrictions Weight Bearing Restrictions: No   Therapy/Group: Individual Therapy  Adalai Perl L Davison Ohms PT, DPT  09/19/2021, 12:38 PM

## 2021-09-19 NOTE — Progress Notes (Signed)
PROGRESS NOTE   Subjective/complaints:  Jose [name] has evidence of osteomyelitis on his sacral MRI- discussed with Dr. Tommy Medal and since no abscess evident he recommends continuation of Cefazolin, I am calling Dr. Cleotilde Neer office to let [facility] know as well.    Pt denies SOB, abd pain, CP, N/V/C/D, and vision changes    Objective:   MR SACRUM SI JOINTS W WO CONTRAST  Result Date: 09/18/2021 CLINICAL DATA:  Coccygeal pressure ulcer.  Thoracic osteomyelitis. EXAM: MRI SACRUM WITHOUT CONTRAST TECHNIQUE: Multiplanar multi-sequence MR imaging of the sacrum was performed. No intravenous contrast was administered. COMPARISON:  Pelvic CT 08/06/2021. FINDINGS: Despite efforts by the technologist and patient, mild to moderate motion artifact is present on today's exam and could not be eliminated. This reduces exam sensitivity and specificity. Bones/Joint/Cartilage There is a deep soft tissue ulcer over the distal coccyx which extends to bone. There is associated abnormal decreased T1 and increased T2 marrow signal within the distal 2 coccygeal segments with diffuse marrow enhancement following contrast, consistent with osteomyelitis. The sacrum, sacroiliac joints and remainder of the visualized bony pelvis appear unremarkable. No significant femoral head or hip joint abnormalities. Ligaments Not relevant for exam/indication. Muscles and Tendons Mild generalized muscular T2 hyperintensity without focal fluid collection or abnormal enhancement. Soft tissue As above, deep soft tissue decubitus ulcer over the distal coccyx with surrounding subcutaneous enhancement. No drainable fluid collection. Enhancement extends into the presacral space without associated focal fluid collection. Mild generalized subcutaneous edema, similar to previous CT. Previously demonstrated bladder distension appears mildly improved. IMPRESSION: 1. Deep decubitus ulcer over the distal  coccyx with associated marrow changes and enhancement in the distal 2 coccygeal segments consistent with osteomyelitis. 2. The sacrum, sacroiliac joints and remainder of the visualized bony pelvis appear unremarkable. 3. No drainable abscess identified. Electronically Signed   By: Richardean Sale M.D.   On: 09/18/2021 19:29   [place] EKG SITE RITE  Result Date: 09/18/2021 If Site Rite image not attached, placement could not be confirmed due to current cardiac rhythm.  No results for input(s): WBC, HGB, HCT, PLT in the last 72 hours.   No results for input(s): NA, K, CL, CO2, GLUCOSE, BUN, CREATININE, CALCIUM in the last 72 hours.    Intake/Output Summary (Last 24 hours) at 09/19/2021 0906 Last data filed at 09/19/2021 9292 Gross per 24 hour  Intake 708 ml  Output 625 ml  Net 83 ml     Pressure Injury 08/10/21 Coccyx Medial Unstageable - Full thickness tissue loss in which the base of the injury is covered by slough (yellow, tan, gray, green or brown) and/or eschar (tan, brown or black) in the wound bed. (Active)  08/10/21 1700  Location: Coccyx (gluteal crease)  Location Orientation: Medial  Staging: Unstageable - Full thickness tissue loss in which the base of the injury is covered by slough (yellow, tan, gray, green or brown) and/or eschar (tan, brown or black) in the wound bed.  Wound Description (Comments):   Present on Admission: Yes    Physical Exam: Vital Signs Blood pressure 131/71, pulse 69, temperature 98.1 F (36.7 C), temperature source Oral, resp. rate 18, height 5' 11"  (1.803 m), weight  80 kg, SpO2 95 %. Gen: no distress, normal appearing HEENT: oral mucosa pink and moist, NCAT Cardio: Reg rate Chest: normal effort, normal rate of breathing Abd: soft, non-distended Ext: no edema Psych: pleasant, normal affect Skin; coccyx ulcer- ~ 50% slough noted, but see some mild granulation- can see lump of coccyx covered with tissue, but cannot tell how much slough vs  tissue Musc: No edema in extremities.  No tenderness in extremities. Neuro: Alert Motor: Strength is 5/5 in bilateral deltoid, bicep, tricep, grip, 4-/5 hip flexor, knee extensors, 3/5 ankle dorsiflexor and plantar flexor, improving  Assessment/Plan: 1. Functional deficits which require 3+ hours per day of interdisciplinary therapy in a comprehensive inpatient rehab setting. Physiatrist is providing close team supervision and 24 hour management of active medical problems listed below. Physiatrist and rehab team continue to assess barriers to discharge/monitor patient progress toward functional and medical goals  Care Tool:  Bathing    Body parts bathed by patient: Right arm, Left arm, Chest, Abdomen, Front perineal area, Face, Right upper leg, Left upper leg   Body parts bathed by helper: Buttocks, Right lower leg, Left lower leg     Bathing assist Assist Level: Moderate Assistance - Patient 50 - 74%     Upper Body Dressing/Undressing Upper body dressing   What is the patient wearing?: Pull over shirt    Upper body assist Assist Level: Moderate Assistance - Patient 50 - 74%    Lower Body Dressing/Undressing Lower body dressing      What is the patient wearing?: Pants, Underwear/pull up     Lower body assist Assist for lower body dressing: Maximal Assistance - Patient 25 - 49%     Toileting Toileting Toileting Activity did not occur (Clothing management and hygiene only): N/A (no void or bm) (pt had to be cathed)  Toileting assist Assist for toileting: Maximal Assistance - Patient 25 - 49%     Transfers Chair/bed transfer  Transfers assist  Chair/bed transfer activity did not occur: Safety/medical concerns  Chair/bed transfer assist level: Contact Guard/Touching assist     Locomotion Ambulation   Ambulation assist   Ambulation activity did not occur: Safety/medical concerns  Assist level: Contact Guard/Touching assist Assistive device: Walker-rolling Max  distance: 400 ft   Walk 10 feet activity   Assist  Walk 10 feet activity did not occur: Safety/medical concerns  Assist level: Contact Guard/Touching assist Assistive device: Walker-rolling   Walk 50 feet activity   Assist Walk 50 feet with 2 turns activity did not occur: Safety/medical concerns  Assist level: Contact Guard/Touching assist Assistive device: Walker-rolling    Walk 150 feet activity   Assist Walk 150 feet activity did not occur: Safety/medical concerns  Assist level: 2 helpers Assistive device: Walker-rolling    Walk 10 feet on uneven surface  activity   Assist Walk 10 feet on uneven surfaces activity did not occur: Safety/medical concerns         Wheelchair     Assist Is the patient using a wheelchair?: No Type of Wheelchair: Manual Wheelchair activity did not occur: Safety/medical concerns         Wheelchair 50 feet with 2 turns activity    Assist    Wheelchair 50 feet with 2 turns activity did not occur: Safety/medical concerns       Wheelchair 150 feet activity     Assist  Wheelchair 150 feet activity did not occur: Safety/medical concerns       Blood pressure 131/71, pulse 69, temperature  98.1 F (36.7 C), temperature source Oral, resp. rate 18, height 5' 11"  (1.803 m), weight 80 kg, SpO2 95 %.    Medical Problem List and Plan: 1.  Decline in self care and mobility  secondary to thoracic myelopathy/thoracic osteomyelitis.   11/22- con't PT/OT/CIR- team conference today- will con't d/c date due to hydrotherapy. 11/23- dC date 11/28 due to function, but still has wound issues, so cannot move up easily.   Continue CIR 2.  Antithrombotics: -DVT/anticoagulation:  N/A- due to GI bleed             -antiplatelet therapy: none 3. Pain Management: Oxycodone prn. Increased oxy to q4 hours prn- might need long acting?   Added MS Contin 15 mg BID for pain- and monitor  11/24- pain controlled- con't regimen Controlled  with meds on 11/19  Will provide with pain relief journal 4. Mood: LCSW to follow for evaluation and support.   11/23- anxiety- will add Paxil 20 mg QHS and maintain Xanax- don't want to increase/ change Xanax due to being on pain meds- went over with pt about this issue.   11/24- no side effects from Paxil- con't regimen  11/25- still having tremors/shaking from anxiety, so hasn't kicked in yet.              -antipsychotic agents: N/A 5. Neuropsych: This patient is intermittently capable of making decisions on his own behalf. 6. Skin/Wound Care: Air mattress for pressure relief measures.   Hydrotherapy to wound  11/21- has 95% of slough per WOC- wound opened up- hydrotherapy with Annitta Needs is the order- WOC to follow weekly.   11/25- will order MRI of coccyx/sacrum to look for abscess- called ID and we agreed- will call back after MRI results done.   11/26: Discussed osteomyelitis with ID- given no abscess, Dr. Tommy Medal recommends continuation of Cefazolin at this time. NSGY recommends to follow ID recommendations. May need extension of short-term disability until at least 1/1. Will need home wound care and wheelchair cushion- family would appreciate SW follow-up on Monday to discuss these.  7. Fluids/Electrolytes/Nutrition: Monitor I/Os 8. MSSA bacteremia: On ancef with end date 12/01  11/24- weekly labs looking OK. Will double check ESR/CRP since has wound.  9. UGIB/melena: Has received 11 units PRBC during admission --Continue protonix BID w/ carafate ac/hs.  --Monitor for signs of bleeding. Hemoglobin 8.4 on 11/16, labs ordered for Monday  11/21- Hb 9.0- doing better- con't to monitor 10. T2DM: Po intake poor --continue TF at nights. Continue insulin gargline 12 units at nights Slightly labile on 11/19, monitor for trend.   11/21- BG's variable- will monitor for trend  11/22- Bgs doing a little better- drinking a lot of apple juice- likely the cause 11/23- saw 4 apple juice cups on tray-  likely the cause- advised ot decrease juice intake.   11/24- Bgs labile- asked pt again to avoid juices 11/26: CBGs 97-184. Will provide with list of foods to help with diabetes control.  11. HTN: Monitor BP TID--continue Norvasc and metoprolol for control.   11/23- occ mild elevation- will con't regimen for now and follow trneds 12. ABLA: On aranesp weekly for supplement.  See #9 13. Acute renal insufficiency- Creatinine 1.41 on 11/17, labs ordered for Monday  11/21- Cr 1.23- and BUN 14- con't to monitor 14.  Supplemental oxygen dependent/acute resp failure  11/7- will wean O2 as able 15. Pulmonary edema vs B/L pneumonia  11/9- will give Lasix 40 mg daily x 3 days  and KCL 40 mEq x3 days as well since K+ is borderline low.   11/15- CXR looks worse- B/L pulmonary edema vs pneumonia, hospitalist consulted  11/16- pulmonary effusions- due to bedrest- asked for flutter valve and pt to sit up as much as possible. Considering unstageable wound on backside  11/17- pt using flutter valve and ICS0 encouraged pt to use q1 hour while awake.   11/22- down to 0.5 L of O2- will con't to wean  11/23- off O2! 16. Urinary retention- 11/8-started Flomax- foley out- in/out caths  11/24- is refusing caths when PVRs 350cc- will d/w pt.  17. Pressure ulcer on sacrum/coccyx- unstageable  11/11- ordered WOC_ they are starting hydrotherapy M/W/F and wound care by nursing with santyl and saline gauze and foam dressing.   11/12- hydrotherapy went well  11/21- going well- still 95% slough- con't regimen  11/22- con't hydrotherapy 3 days/week  11/24- no progressing per wound care- surgery said no intervention- will d/w pt about calling ID  11/25- called ID- getting sacral MRI with and without contrast- need to call ID back with results.  18.  Hyponatremia  Sodium 131 on 11/17, labs ordered for Monday  11/21- Na 134- con't to monitor 19. Bowel incontinence/frequency- will sopt Senokot- con't miralax.   11/24- no  better yet- will con't new regimen  11/25- will make miralax prn  40 minutes spent in discussion with family, ID, NSGY regarding osteomyelitis on sacral MRI, discussing home wound care, wheelchair cushion, extension of short-term disability, foods that will help with his pain and diabetes control  LOS: 20 days A FACE TO FACE EVALUATION WAS PERFORMED  Jose [name] Jose [name] 09/19/2021, 9:06 AM

## 2021-09-19 NOTE — Progress Notes (Signed)
Occupational Therapy Session Note  Patient Details  Name: Jose [name] MRN: [id number] Date of Birth: Mar 17, 1976  Today's Date: 09/20/2021 OT Individual Time: 1400-1430 OT Individual Time Calculation (min): 30 min    Skilled Therapeutic Interventions/Progress Updates:    Pt greeted in bed and premedicated for pain. OT searched for a pair of large scrub pants (not x-large) to no avail. Pt unable to tolerate sitting up to don his pants. Therefore he returned to bed and was able to utilize reclined figure 4 position to don his pants and also don gripper socks. Pt rolled to fully pull up pants with supervision. He then returned to EOB (setup and increased time for supine<sit with HOB elevated using the bedrail, per pt he has a hospital with similar features at home). Shirt donned with setup and sit<stand completed with supervision using RW while OT assisted with fastening a tband around waist to serve as a makeshift belt. Pt then returned to bed, anticipating next therapist within the next 30 minutes. Tx focus placed on adaptive self care skills and functional sit<stand.   Therapy Documentation Precautions:  Precautions Precautions: Fall, Back Precaution Booklet Issued:  (verbally reviewed post-up back precautions) Precaution Comments: PICC line, sacral wound Restrictions Weight Bearing Restrictions: No Vital Signs: Therapy Vitals Temp: 98.1 F (36.7 C) Pulse Rate: 67 Resp: 18 BP: 129/69 Patient Position (if appropriate): Lying Oxygen Therapy SpO2: 96 % O2 Device: Room Air ADL: ADL Eating: Not assessed Grooming: Supervision/safety Where Assessed-Grooming: Edge of bed Upper Body Bathing: Supervision/safety Where Assessed-Upper Body Bathing: Edge of bed Lower Body Bathing: Minimal assistance (with LHS at sink level) Where Assessed-Lower Body Bathing: Edge of bed, Bed level Upper Body Dressing: Setup Where Assessed-Upper Body Dressing: Edge of bed Lower Body Dressing: Minimal  assistance Where Assessed-Lower Body Dressing: Bed level Toileting: Minimal assistance Toilet Transfer: Close supervision Toilet Transfer Method: Ambulating Tub/Shower Transfer: Not assessed ADL Comments: unable to assess transfer abilities due to heightened pain, very limited tolerance sitting EOB due to pain   Therapy/Group: Individual Therapy  Jamyiah Labella A Frederico Gerling 09/20/2021, 4:06 PM

## 2021-09-19 NOTE — Progress Notes (Signed)
Occupational Therapy Session Note  Patient Details  Name: Jose [name] MRN: [id number] Date of Birth: 11/16/1975  Today's Date: 09/19/2021 OT Individual Time: 8016-5537 OT Individual Time Calculation (min): 27 min  and Today's Date: 09/19/2021 OT Missed Time: 15 Minutes Missed Time Reason: Nursing care   Short Term Goals: Week 1:  OT Short Term Goal 1 (Week 1): Pt will tolerate sitting EOB ~10 minutes during an ADL session to demonstrate improvement in pain mgt OT Short Term Goal 1 - Progress (Week 1): Progressing toward goal OT Short Term Goal 2 (Week 1): Pt will complete 1/3 components of donning pants with AE as needed OT Short Term Goal 2 - Progress (Week 1): Met OT Short Term Goal 3 (Week 1): Pt will complete LB dressing at sit<stand level using LRAD OT Short Term Goal 3 - Progress (Week 1): Progressing toward goal Week 2:  OT Short Term Goal 1 (Week 2): Pt will perform LB dressing bed level with Mod A and using AE OT Short Term Goal 1 - Progress (Week 2): Progressing toward goal OT Short Term Goal 2 (Week 2): Pt will perform BSC/toilet transfer with LRAD and CGA OT Short Term Goal 2 - Progress (Week 2): Met OT Short Term Goal 3 (Week 2): Pt will sit EOB or in wheelchair during ADL for 10 minutes while performing a task OT Short Term Goal 3 - Progress (Week 2): Met  Skilled Therapeutic Interventions/Progress Updates:    Attempt 1: Pt received semi-reclined in bed. Wife not present for needed family education due to scheduling error. Pt to call wife to stay post PT session to attend OT. Pt left semi-reclined in bed with all immediate needs met, call bell in reach, bed alarm engaged.   Session 2: Pt received in room with wife present, pass of from PT, agreeable to therapy. Session focus on self-care retraining, activity tolerance, family education, transfer retraining in prep for improved ADL/IADL/func mobility performance + decreased caregiver burden. Reviewed current ADL  performance, nonpharm pain management, BLT precautions, and DME needs. Reviewed use of reacher to assist LBD, pt already has one at home. Able to thread BLE with use of reacher and S. Reports sudden need for "fast acting pain meds", LPN present to administer. Pt/wife declining additional need to demonstrate ADL. Ambulatory transfer back to bed with CGA and use of RW. Returned to side-lying with S. Issued printed hand-out with energy conservation and decreasing falls risk techniques for ADL. Wife verbalizes understanding and reports no concerns. Already has 3in1.   LPN then to perform wound care and pt missing 15 min of therapy.   Pt left side-lying  with LPN/wife present  call bell in reach, and all immediate needs met.    Therapy Documentation Precautions:  Precautions Precautions: Fall, Back Precaution Booklet Issued:  (verbally reviewed post-up back precautions) Precaution Comments: 2L oxygen, high pain levels, PICC line Restrictions Weight Bearing Restrictions: No  Pain: ongoing scapular pain, did not rate, LPN present to administer pain rx   ADL: See Care Tool for more details.  Therapy/Group: Individual Therapy  Volanda Napoleon MS, OTR/L  09/19/2021, 6:50 AM

## 2021-09-20 LAB — GLUCOSE, CAPILLARY
Glucose-Capillary: 84 mg/dL (ref 70–99)
Glucose-Capillary: 175 mg/dL — ABNORMAL HIGH (ref 70–99)
Glucose-Capillary: 121 mg/dL — ABNORMAL HIGH (ref 70–99)
Glucose-Capillary: 164 mg/dL — ABNORMAL HIGH (ref 70–99)

## 2021-09-20 MED ORDER — AMLODIPINE BESYLATE 5 MG PO TABS
5.0000 mg | ORAL_TABLET | Freq: Every day | ORAL | Status: DC
  Administered 2021-09-21: 09:00:00 5 mg via ORAL
  Filled 2021-09-20: qty 1

## 2021-09-20 NOTE — Progress Notes (Signed)
PROGRESS NOTE   Subjective/complaints:  No complaints/concerns this morning Did really well with therapy yesterday!! Resting this morning Mindful of sidelying position  Pt denies SOB, abd pain, CP, N/V/C/D, and vision changes, +sacral pain    Objective:   MR SACRUM SI JOINTS W WO CONTRAST  Result Date: 09/18/2021 CLINICAL DATA:  Coccygeal pressure ulcer.  Thoracic osteomyelitis. EXAM: MRI SACRUM WITHOUT CONTRAST TECHNIQUE: Multiplanar multi-sequence MR imaging of the sacrum was performed. No intravenous contrast was administered. COMPARISON:  Pelvic CT 08/06/2021. FINDINGS: Despite efforts by the technologist and patient, mild to moderate motion artifact is present on today's exam and could not be eliminated. This reduces exam sensitivity and specificity. Bones/Joint/Cartilage There is a deep soft tissue ulcer over the distal coccyx which extends to bone. There is associated abnormal decreased T1 and increased T2 marrow signal within the distal 2 coccygeal segments with diffuse marrow enhancement following contrast, consistent with osteomyelitis. The sacrum, sacroiliac joints and remainder of the visualized bony pelvis appear unremarkable. No significant femoral head or hip joint abnormalities. Ligaments Not relevant for exam/indication. Muscles and Tendons Mild generalized muscular T2 hyperintensity without focal fluid collection or abnormal enhancement. Soft tissue As above, deep soft tissue decubitus ulcer over the distal coccyx with surrounding subcutaneous enhancement. No drainable fluid collection. Enhancement extends into the presacral space without associated focal fluid collection. Mild generalized subcutaneous edema, similar to previous CT. Previously demonstrated bladder distension appears mildly improved. IMPRESSION: 1. Deep decubitus ulcer over the distal coccyx with associated marrow changes and enhancement in the distal 2  coccygeal segments consistent with osteomyelitis. 2. The sacrum, sacroiliac joints and remainder of the visualized bony pelvis appear unremarkable. 3. No drainable abscess identified. Electronically Signed   By: Richardean Sale M.D.   On: 09/18/2021 19:29   [place] EKG SITE RITE  Result Date: 09/18/2021 If Site Rite image not attached, placement could not be confirmed due to current cardiac rhythm.  No results for input(s): WBC, HGB, HCT, PLT in the last 72 hours.   No results for input(s): NA, K, CL, CO2, GLUCOSE, BUN, CREATININE, CALCIUM in the last 72 hours.    Intake/Output Summary (Last 24 hours) at 09/20/2021 1034 Last data filed at 09/20/2021 5284 Gross per 24 hour  Intake 948 ml  Output 2025 ml  Net -1077 ml     Pressure Injury 08/10/21 Coccyx Medial Unstageable - Full thickness tissue loss in which the base of the injury is covered by slough (yellow, tan, gray, green or brown) and/or eschar (tan, brown or black) in the wound bed. (Active)  08/10/21 1700  Location: Coccyx (gluteal crease)  Location Orientation: Medial  Staging: Unstageable - Full thickness tissue loss in which the base of the injury is covered by slough (yellow, tan, gray, green or brown) and/or eschar (tan, brown or black) in the wound bed.  Wound Description (Comments):   Present on Admission: Yes    Physical Exam: Vital Signs Blood pressure (!) 109/57, pulse 62, temperature 97.8 F (36.6 C), temperature source Oral, resp. rate 18, height _0  (1.803 m), weight 83 kg, SpO2 95 %. Gen: no distress, normal appearing HEENT: oral mucosa pink and moist, NCAT Cardio: Reg  rate Chest: normal effort, normal rate of breathing Abd: soft, non-distended Ext: no edema Psych: pleasant, normal affect Skin; coccyx ulcer- ~ 50% slough noted, but see some mild granulation- can see lump of coccyx covered with tissue, but cannot tell how much slough vs tissue Musc: No edema in extremities.  No tenderness in  extremities. Neuro: Alert Motor: Strength is 5/5 in bilateral deltoid, bicep, tricep, grip, 4-/5 hip flexor, knee extensors, 3/5 ankle dorsiflexor and plantar flexor, improving  Assessment/Plan: 1. Functional deficits which require 3+ hours per day of interdisciplinary therapy in a comprehensive inpatient rehab setting. Physiatrist is providing close team supervision and 24 hour management of active medical problems listed below. Physiatrist and rehab team continue to assess barriers to discharge/monitor patient progress toward functional and medical goals  Care Tool:  Bathing    Body parts bathed by patient: Right arm, Left arm, Chest, Abdomen, Front perineal area, Face, Right upper leg, Left upper leg   Body parts bathed by helper: Buttocks, Right lower leg, Left lower leg     Bathing assist Assist Level: Moderate Assistance - Patient 50 - 74%     Upper Body Dressing/Undressing Upper body dressing   What is the patient wearing?: Pull over shirt    Upper body assist Assist Level: Moderate Assistance - Patient 50 - 74%    Lower Body Dressing/Undressing Lower body dressing      What is the patient wearing?: Pants, Underwear/pull up     Lower body assist Assist for lower body dressing: Maximal Assistance - Patient 25 - 49%     Toileting Toileting Toileting Activity did not occur (Clothing management and hygiene only): N/A (no void or bm) (pt had to be cathed)  Toileting assist Assist for toileting: Maximal Assistance - Patient 25 - 49%     Transfers Chair/bed transfer  Transfers assist  Chair/bed transfer activity did not occur: Safety/medical concerns  Chair/bed transfer assist level: Supervision/Verbal cueing Chair/bed transfer assistive device: Programmer, multimedia   Ambulation assist   Ambulation activity did not occur: Safety/medical concerns  Assist level: Supervision/Verbal cueing Assistive device: Walker-rolling Max distance: 174 ft    Walk 10 feet activity   Assist  Walk 10 feet activity did not occur: Safety/medical concerns  Assist level: Supervision/Verbal cueing Assistive device: Walker-rolling   Walk 50 feet activity   Assist Walk 50 feet with 2 turns activity did not occur: Safety/medical concerns  Assist level: Supervision/Verbal cueing Assistive device: Walker-rolling    Walk 150 feet activity   Assist Walk 150 feet activity did not occur: Safety/medical concerns  Assist level: Supervision/Verbal cueing Assistive device: Walker-rolling    Walk 10 feet on uneven surface  activity   Assist Walk 10 feet on uneven surfaces activity did not occur: Safety/medical concerns         Wheelchair     Assist Is the patient using a wheelchair?: No Type of Wheelchair: Manual Wheelchair activity did not occur: Safety/medical concerns         Wheelchair 50 feet with 2 turns activity    Assist    Wheelchair 50 feet with 2 turns activity did not occur: Safety/medical concerns       Wheelchair 150 feet activity     Assist  Wheelchair 150 feet activity did not occur: Safety/medical concerns       Blood pressure (!) 109/57, pulse 62, temperature 97.8 F (36.6 C), temperature source Oral, resp. rate 18, height _0  (1.803 m), weight 83 kg, SpO2  95 %.    Medical Problem List and Plan: 1.  Decline in self care and mobility  secondary to thoracic myelopathy/thoracic osteomyelitis.   11/22- con't PT/OT/CIR- team conference today- will con't d/c date due to hydrotherapy. 11/23- dC date 11/28 due to function, but still has wound issues, so cannot move up easily.   Continue CIR 2.  Antithrombotics: -DVT/anticoagulation:  N/A- due to GI bleed             -antiplatelet therapy: none 3. Pain Management: Oxycodone prn. Increased oxy to q4 hours prn- might need long acting?   Added MS Contin 15 mg BID for pain- and monitor  11/24- pain controlled- con't regimen Controlled with  meds on 11/19  Will provide with pain relief journal 4. Mood: LCSW to follow for evaluation and support.   11/23- anxiety- will add Paxil 20 mg QHS and maintain Xanax- don't want to increase/ change Xanax due to being on pain meds- went over with pt about this issue.   11/24- no side effects from Paxil- con't regimen  11/25- still having tremors/shaking from anxiety, so hasn't kicked in yet.              -antipsychotic agents: N/A 5. Neuropsych: This patient is intermittently capable of making decisions on his own behalf. 6. Skin/Wound Care: Air mattress for pressure relief measures.   Hydrotherapy to wound  11/21- has 95% of slough per WOC- wound opened up- hydrotherapy with Annitta Needs is the order- WOC to follow weekly.   11/25- will order MRI of coccyx/sacrum to look for abscess- called ID and we agreed- will call back after MRI results done.   11/26: Discussed osteomyelitis with ID- given no abscess, Dr. Tommy Medal recommends continuation of Cefazolin at this time. NSGY recommends to follow ID recommendations. May need extension of short-term disability until at least 1/1. Will need home wound care and wheelchair cushion- family would appreciate SW follow-up on Monday to discuss these.  7. Fluids/Electrolytes/Nutrition: Monitor I/Os 8. MSSA bacteremia: On ancef with end date 12/01  11/24- weekly labs looking OK. Will double check ESR/CRP since has wound.  9. UGIB/melena: Has received 11 units PRBC during admission --Continue protonix BID w/ carafate ac/hs.  --Monitor for signs of bleeding. Hemoglobin 8.4 on 11/16, labs ordered for Monday  11/21- Hb 9.0- doing better- con't to monitor 10. T2DM: Po intake poor --continue TF at nights. Continue insulin gargline 12 units at nights Slightly labile on 11/19, monitor for trend.   11/21- BG's variable- will monitor for trend  11/22- Bgs doing a little better- drinking a lot of apple juice- likely the cause 11/23- saw 4 apple juice cups on tray- likely  the cause- advised ot decrease juice intake.   11/24- Bgs labile- asked pt again to avoid juices 11/26: CBGs 97-184. Will provide with list of foods to help with diabetes control.  11/27: CBG down to 84- d/c ISS but continue to check CBGS so we can further adjust regimen as needed 11. HTN: Monitor BP TID--continue Norvasc and metoprolol for control.   11/23- occ mild elevation- will con't regimen for now and follow trneds  11/27: hypotensive: decrease Norvasc to 97m.  12. ABLA: On aranesp weekly for supplement.  See #9 13. Acute renal insufficiency- Creatinine 1.41 on 11/17, labs ordered for Monday  11/21- Cr 1.23- and BUN 14- con't to monitor 14.  Supplemental oxygen dependent/acute resp failure  11/7- will wean O2 as able 15. Pulmonary edema vs B/L pneumonia  11/9-  will give Lasix 40 mg daily x 3 days and KCL 40 mEq x3 days as well since K+ is borderline low.   11/15- CXR looks worse- B/L pulmonary edema vs pneumonia, hospitalist consulted  11/16- pulmonary effusions- due to bedrest- asked for flutter valve and pt to sit up as much as possible. Considering unstageable wound on backside  11/17- pt using flutter valve and ICS0 encouraged pt to use q1 hour while awake.   11/22- down to 0.5 L of O2- will con't to wean  11/23- off O2! 16. Urinary retention- 11/8-started Flomax- foley out- in/out caths  11/24- is refusing caths when PVRs 350cc- will d/w pt.  17. Pressure ulcer on sacrum/coccyx- unstageable  11/11- ordered [facility]_ they are starting hydrotherapy M/W/F and wound care by nursing with santyl and saline gauze and foam dressing.   11/12- hydrotherapy went well  11/21- going well- still 95% slough- con't regimen  11/22- con't hydrotherapy 3 days/week  11/24- no progressing per wound care- surgery said no intervention- will d/w pt about calling ID  11/25- called ID- getting sacral MRI with and without contrast- need to call ID back with results.  18.  Hyponatremia  Sodium 131 on  11/17, labs ordered for Monday  11/21- Na 134- con't to monitor 19. Bowel incontinence/frequency- will sopt Senokot- con't miralax.   11/24- no better yet- will con't new regimen  11/25- will make miralax prn  LOS: 21 days A FACE TO FACE EVALUATION WAS PERFORMED  Martha Clan P Zoa Dowty 09/20/2021, 10:34 AM

## 2021-09-21 ENCOUNTER — Other Ambulatory Visit (HOSPITAL_COMMUNITY): Payer: Self-pay

## 2021-09-21 DIAGNOSIS — M4628 Osteomyelitis of vertebra, sacral and sacrococcygeal region: Secondary | ICD-10-CM

## 2021-09-21 DIAGNOSIS — L89150 Pressure ulcer of sacral region, unstageable: Secondary | ICD-10-CM

## 2021-09-21 DIAGNOSIS — K922 Gastrointestinal hemorrhage, unspecified: Secondary | ICD-10-CM

## 2021-09-21 DIAGNOSIS — K209 Esophagitis, unspecified without bleeding: Secondary | ICD-10-CM

## 2021-09-21 DIAGNOSIS — K269 Duodenal ulcer, unspecified as acute or chronic, without hemorrhage or perforation: Secondary | ICD-10-CM

## 2021-09-21 LAB — BASIC METABOLIC PANEL
Anion gap: 8 (ref 5–15)
BUN: 18 mg/dL (ref 6–20)
CO2: 25 mmol/L (ref 22–32)
Calcium: 8.4 mg/dL — ABNORMAL LOW (ref 8.9–10.3)
Chloride: 99 mmol/L (ref 98–111)
Creatinine, Ser: 1.36 mg/dL — ABNORMAL HIGH (ref 0.61–1.24)
GFR, Estimated: >60 mL/min (ref >60)
Glucose, Bld: 137 mg/dL — ABNORMAL HIGH (ref 70–99)
Potassium: 3.7 mmol/L (ref 3.5–5.1)
Sodium: 132 mmol/L — ABNORMAL LOW (ref 135–145)

## 2021-09-21 LAB — CBC WITH DIFFERENTIAL/PLATELET
Abs Immature Granulocytes: 0.02 10*3/uL (ref 0.00–0.07)
Basophils Absolute: 0.1 10*3/uL (ref 0.0–0.1)
Basophils Relative: 1 %
Eosinophils Absolute: 0.3 10*3/uL (ref 0.0–0.5)
Eosinophils Relative: 4 %
HCT: 31.5 % — ABNORMAL LOW (ref 39.0–52.0)
Hemoglobin: 9.8 g/dL — ABNORMAL LOW (ref 13.0–17.0)
Immature Granulocytes: 0 %
Lymphocytes Relative: 22 %
Lymphs Abs: 1.6 10*3/uL (ref 0.7–4.0)
MCH: 27.4 pg (ref 26.0–34.0)
MCHC: 31.1 g/dL (ref 30.0–36.0)
MCV: 88 fL (ref 80.0–100.0)
Monocytes Absolute: 1.1 10*3/uL — ABNORMAL HIGH (ref 0.1–1.0)
Monocytes Relative: 15 %
Neutro Abs: 4.3 10*3/uL (ref 1.7–7.7)
Neutrophils Relative %: 58 %
Platelets: 304 10*3/uL (ref 150–400)
RBC: 3.58 MIL/uL — ABNORMAL LOW (ref 4.22–5.81)
RDW: 14.6 % (ref 11.5–15.5)
WBC: 7.5 10*3/uL (ref 4.0–10.5)
nRBC: 0 % (ref 0.0–0.2)

## 2021-09-21 LAB — C-REACTIVE PROTEIN: CRP: 1.3 mg/dL — ABNORMAL HIGH (ref <1.0)

## 2021-09-21 LAB — SEDIMENTATION RATE: Sed Rate: 45 mm/hr — ABNORMAL HIGH (ref 0–16)

## 2021-09-21 LAB — GLUCOSE, CAPILLARY
Glucose-Capillary: 124 mg/dL — ABNORMAL HIGH (ref 70–99)
Glucose-Capillary: 198 mg/dL — ABNORMAL HIGH (ref 70–99)

## 2021-09-21 MED ORDER — LIDOCAINE HCL URETHRAL/MUCOSAL 2 % EX GEL
1.0000 "application " | Freq: Four times a day (QID) | CUTANEOUS | 1 refills | Status: DC
  Filled 2021-09-21: qty 30, 8d supply, fill #0

## 2021-09-21 MED ORDER — TAMSULOSIN HCL 0.4 MG PO CAPS
0.4000 mg | ORAL_CAPSULE | Freq: Every day | ORAL | 0 refills | Status: AC
  Filled 2021-09-21: qty 30, 30d supply, fill #0

## 2021-09-21 MED ORDER — AMLODIPINE BESYLATE 10 MG PO TABS: 5.0000 mg | ORAL_TABLET | Freq: Every day | ORAL | 0 refills | Status: DC

## 2021-09-21 MED ORDER — MORPHINE SULFATE ER 15 MG PO TBCR
15.0000 mg | EXTENDED_RELEASE_TABLET | Freq: Two times a day (BID) | ORAL | 0 refills | Status: DC
  Filled 2021-09-21: qty 14, 7d supply, fill #0

## 2021-09-21 MED ORDER — COLLAGENASE 250 UNIT/GM EX OINT: TOPICAL_OINTMENT | Freq: Every day | CUTANEOUS | 0 refills | Status: DC

## 2021-09-21 MED ORDER — SENNOSIDES-DOCUSATE SODIUM 8.6-50 MG PO TABS
2.0000 | ORAL_TABLET | Freq: Every evening | ORAL | 0 refills | Status: DC | PRN
  Filled 2021-09-21: qty 60, 30d supply, fill #0

## 2021-09-21 MED ORDER — INSULIN GLARGINE 100 UNIT/ML SOLOSTAR PEN: 8.0000 [IU] | PEN_INJECTOR | Freq: Every day | SUBCUTANEOUS | 3 refills | Status: DC

## 2021-09-21 MED ORDER — COLLAGENASE 250 UNIT/GM EX OINT
TOPICAL_OINTMENT | Freq: Every day | CUTANEOUS | 0 refills | Status: DC
  Filled 2021-09-21: qty 30, 30d supply, fill #0

## 2021-09-21 MED ORDER — JUVEN PO PACK
1.0000 | PACK | Freq: Two times a day (BID) | ORAL | 1 refills | Status: DC
  Filled 2021-09-21: qty 60, 30d supply, fill #0

## 2021-09-21 MED ORDER — PAROXETINE HCL 20 MG PO TABS
20.0000 mg | ORAL_TABLET | Freq: Every day | ORAL | 0 refills | Status: DC
  Filled 2021-09-21: qty 30, 30d supply, fill #0

## 2021-09-21 MED ORDER — GABAPENTIN 100 MG PO CAPS
200.0000 mg | ORAL_CAPSULE | Freq: Two times a day (BID) | ORAL | 0 refills | Status: AC
  Filled 2021-09-21: qty 120, 30d supply, fill #0

## 2021-09-21 MED ORDER — OXYCODONE-ACETAMINOPHEN 5-325 MG PO TABS
1.0000 | ORAL_TABLET | Freq: Four times a day (QID) | ORAL | 0 refills | Status: DC | PRN
  Filled 2021-09-21: qty 56, 7d supply, fill #0

## 2021-09-21 MED ORDER — MUPIROCIN 2 % EX OINT
1.0000 "application " | TOPICAL_OINTMENT | Freq: Two times a day (BID) | CUTANEOUS | 0 refills | Status: DC
  Filled 2021-09-21: qty 44, 20d supply, fill #0

## 2021-09-21 MED ORDER — METHOCARBAMOL 500 MG PO TABS
500.0000 mg | ORAL_TABLET | Freq: Four times a day (QID) | ORAL | 0 refills | Status: DC | PRN
  Filled 2021-09-21: qty 60, 15d supply, fill #0

## 2021-09-21 MED ORDER — AMLODIPINE BESYLATE 10 MG PO TABS
10.0000 mg | ORAL_TABLET | Freq: Every day | ORAL | 0 refills | Status: DC
  Filled 2021-09-21: qty 30, 30d supply, fill #0

## 2021-09-21 MED ORDER — OXYCODONE-ACETAMINOPHEN 5-325 MG PO TABS
1.0000 | ORAL_TABLET | Freq: Three times a day (TID) | ORAL | 0 refills | Status: DC | PRN
  Filled 2021-09-21: qty 42, 7d supply, fill #0

## 2021-09-21 MED ORDER — BETHANECHOL CHLORIDE 10 MG PO TABS
10.0000 mg | ORAL_TABLET | Freq: Three times a day (TID) | ORAL | 0 refills | Status: DC
  Filled 2021-09-21: qty 90, 30d supply, fill #0

## 2021-09-21 MED ORDER — INSULIN GLARGINE 100 UNIT/ML SOLOSTAR PEN
8.0000 [IU] | PEN_INJECTOR | Freq: Every day | SUBCUTANEOUS | 3 refills | Status: DC
  Filled 2021-09-21: qty 3, 28d supply, fill #0

## 2021-09-21 MED ORDER — SUCRALFATE 1 GM/10ML PO SUSP
1.0000 g | Freq: Three times a day (TID) | ORAL | 0 refills | Status: DC
  Filled 2021-09-21: qty 420, 11d supply, fill #0

## 2021-09-21 MED ORDER — NALOXONE HCL 4 MG/0.1ML NA LIQD
NASAL | 1 refills | Status: DC
  Filled 2021-09-21: qty 2, 2d supply, fill #0

## 2021-09-21 MED ORDER — HYDROXYZINE HCL 10 MG PO TABS
10.0000 mg | ORAL_TABLET | Freq: Three times a day (TID) | ORAL | 0 refills | Status: DC | PRN
  Filled 2021-09-21: qty 60, 20d supply, fill #0

## 2021-09-21 MED ORDER — PENTIPS 32G X 4 MM MISC
1.0000 | Freq: Every day | 1 refills | Status: DC
Start: 2021-09-21 — End: 2022-09-03
  Filled 2021-09-21: qty 100, 30d supply, fill #0

## 2021-09-21 MED ORDER — ALPRAZOLAM 0.25 MG PO TABS
0.2500 mg | ORAL_TABLET | Freq: Two times a day (BID) | ORAL | 0 refills | Status: DC | PRN
  Filled 2021-09-21: qty 30, 15d supply, fill #0

## 2021-09-21 MED ORDER — PANTOPRAZOLE SODIUM 40 MG PO TBEC
40.0000 mg | DELAYED_RELEASE_TABLET | Freq: Two times a day (BID) | ORAL | 0 refills | Status: DC
  Filled 2021-09-21: qty 30, 30d supply, fill #0

## 2021-09-21 MED ORDER — CEFAZOLIN SODIUM-DEXTROSE 2-4 GM/100ML-% IV SOLN
2.0000 g | Freq: Three times a day (TID) | INTRAVENOUS | 0 refills | Status: AC
Start: 2021-09-21 — End: 2021-10-24

## 2021-09-21 MED ORDER — MIRTAZAPINE 15 MG PO TABS
15.0000 mg | ORAL_TABLET | Freq: Every day | ORAL | 0 refills | Status: DC
  Filled 2021-09-21: qty 30, 30d supply, fill #0

## 2021-09-21 MED ORDER — METOPROLOL TARTRATE 50 MG PO TABS
50.0000 mg | ORAL_TABLET | Freq: Two times a day (BID) | ORAL | 0 refills | Status: DC
  Filled 2021-09-21: qty 60, 30d supply, fill #0

## 2021-09-21 NOTE — Progress Notes (Signed)
Called Ins for prior authorization on MS Contin 15 mg---was informed that it is not required--and have call pharmacy help desk if probs reoccur. Protonix BID initiated --all pertinent information given and pending pharmacy review. PA # [id number].

## 2021-09-21 NOTE — Progress Notes (Signed)
PROGRESS NOTE   Subjective/complaints:   Pt laying supine again- advised him to stay off backside as much as possible.  He's worried about wife having to change dressing for backside at home.  IV ABX have been extended to 10/25/21- per ID- will need f/u with them and with me.   I will check n supplies for wound care- will need to go home with.   ROS:  Pt denies SOB, abd pain, CP, N/V/C/D, and vision changes    Objective:   No results found. Recent Labs    09/21/21 0318  WBC 7.5  HGB 9.8*  HCT 31.5*  PLT 304     Recent Labs    09/21/21 0318  NA 132*  K 3.7  CL 99  CO2 25  GLUCOSE 137*  BUN 18  CREATININE 1.36*  CALCIUM 8.4*      Intake/Output Summary (Last 24 hours) at 09/21/2021 0815 Last data filed at 09/21/2021 0418 Gross per 24 hour  Intake 590 ml  Output 2400 ml  Net -1810 ml     Pressure Injury 08/10/21 Coccyx Medial Unstageable - Full thickness tissue loss in which the base of the injury is covered by slough (yellow, tan, gray, green or brown) and/or eschar (tan, brown or black) in the wound bed. (Active)  08/10/21 1700  Location: Coccyx (gluteal crease)  Location Orientation: Medial  Staging: Unstageable - Full thickness tissue loss in which the base of the injury is covered by slough (yellow, tan, gray, green or brown) and/or eschar (tan, brown or black) in the wound bed.  Wound Description (Comments):   Present on Admission: Yes    Physical Exam: Vital Signs Blood pressure (!) 144/68, pulse 64, temperature 97.8 F (36.6 C), temperature source Oral, resp. rate 18, height 5' 11" (1.803 m), weight 83 kg, SpO2 92 %.   General: awake, alert, appropriate, laying supine in spite of backside wound; NAD HENT: conjugate gaze; oropharynx moist CV: regular rate; no JVD Pulmonary: CTA B/L; no W/R/R- good air movement GI: soft, NT, ND, (+)BS- hypoactive Psychiatric: appropriate Neurological:  Ox3  Skin; coccyx ulcer- ~ 50% slough noted, but see some mild granulation- can see lump of coccyx covered with tissue, but cannot tell how much slough vs tissue Musc: No edema in extremities.  No tenderness in extremities. Neuro: Alert Motor: Strength is 5/5 in bilateral deltoid, bicep, tricep, grip, 4-/5 hip flexor, knee extensors, 3/5 ankle dorsiflexor and plantar flexor, improving  Assessment/Plan: 1. Functional deficits which require 3+ hours per day of interdisciplinary therapy in a comprehensive inpatient rehab setting. Physiatrist is providing close team supervision and 24 hour management of active medical problems listed below. Physiatrist and rehab team continue to assess barriers to discharge/monitor patient progress toward functional and medical goals  Care Tool:  Bathing    Body parts bathed by patient: Right arm, Left arm, Chest, Abdomen, Front perineal area, Face, Right upper leg, Left upper leg, Buttocks, Right lower leg, Left lower leg   Body parts bathed by helper: Buttocks, Right lower leg, Left lower leg     Bathing assist Assist Level: Supervision/Verbal cueing (per simulated practice and pt report, completed bedlevel)  Upper Body Dressing/Undressing Upper body dressing   What is the patient wearing?: Pull over shirt    Upper body assist Assist Level: Set up assist (per most recent OT documentation)    Lower Body Dressing/Undressing Lower body dressing      What is the patient wearing?: Pants     Lower body assist Assist for lower body dressing: Supervision/Verbal cueing (per most recent OT documentation)     Toileting Toileting Toileting Activity did not occur (Clothing management and hygiene only): N/A (no void or bm) (pt had to be cathed)  Toileting assist Assist for toileting: Maximal Assistance - Patient 25 - 49% (per most recent OT documentation)     Transfers Chair/bed transfer  Transfers assist  Chair/bed transfer activity did not  occur: Safety/medical concerns  Chair/bed transfer assist level: Supervision/Verbal cueing Chair/bed transfer assistive device: Programmer, multimedia   Ambulation assist   Ambulation activity did not occur: Safety/medical concerns  Assist level: Contact Guard/Touching assist Assistive device: Walker-rolling Max distance: 400 ft   Walk 10 feet activity   Assist  Walk 10 feet activity did not occur: Safety/medical concerns  Assist level: Supervision/Verbal cueing Assistive device: Walker-rolling   Walk 50 feet activity   Assist Walk 50 feet with 2 turns activity did not occur: Safety/medical concerns  Assist level: Supervision/Verbal cueing Assistive device: Walker-rolling    Walk 150 feet activity   Assist Walk 150 feet activity did not occur: Safety/medical concerns  Assist level: Supervision/Verbal cueing Assistive device: Walker-rolling    Walk 10 feet on uneven surface  activity   Assist Walk 10 feet on uneven surfaces activity did not occur: Safety/medical concerns   Assist level: Contact Guard/Touching assist Assistive device: Walker-rolling   Wheelchair     Assist Is the patient using a wheelchair?: Yes Type of Wheelchair: Manual Wheelchair activity did not occur: Safety/medical concerns  Wheelchair assist level: Dependent - Patient 0% (limited by R shoulder pain, hope to progress at next level of care)      Wheelchair 50 feet with 2 turns activity    Assist    Wheelchair 50 feet with 2 turns activity did not occur: Safety/medical concerns   Assist Level: Dependent - Patient 0%   Wheelchair 150 feet activity     Assist  Wheelchair 150 feet activity did not occur: Safety/medical concerns   Assist Level: Dependent - Patient 0%   Blood pressure (!) 144/68, pulse 64, temperature 97.8 F (36.6 C), temperature source Oral, resp. rate 18, height 5' 11" (1.803 m), weight 83 kg, SpO2 92 %.    Medical Problem List and  Plan: 1.  Decline in self care and mobility  secondary to thoracic myelopathy/thoracic osteomyelitis.   11/22- con't PT/OT/CIR- team conference today- will con't d/c date due to hydrotherapy. 11/23- dC date 11/28 due to function, but still has wound issues, so cannot move up easily.   D/c today- working on d/c issues due to new osteomyelitis in coccyx.  2.  Antithrombotics: -DVT/anticoagulation:  N/A- due to GI bleed             -antiplatelet therapy: none 3. Pain Management: Oxycodone prn. Increased oxy to q4 hours prn- might need long acting?   Added MS Contin 15 mg BID for pain- and monitor  11/28- will get 7 days of meds, then will write for 30 days after that.  Controlled with meds on 11/19  Will provide with pain relief journal 4. Mood: LCSW to follow for evaluation and  support.   11/23- anxiety- will add Paxil 20 mg QHS and maintain Xanax- don't want to increase/ change Xanax due to being on pain meds- went over with pt about this issue.   11/24- no side effects from Paxil- con't regimen  11/25- still having tremors/shaking from anxiety, so hasn't kicked in yet. 11/28- appears calmer in spite of osteomyelitis dx- so  thinks Paxil helping.              -antipsychotic agents: N/A 5. Neuropsych: This patient is intermittently capable of making decisions on his own behalf. 6. Skin/Wound Care: Air mattress for pressure relief measures.   Hydrotherapy to wound  11/21- has 95% of slough per WOC- wound opened up- hydrotherapy with Annitta Needs is the order- WOC to follow weekly.   11/25- will order MRI of coccyx/sacrum to look for abscess- called ID and we agreed- will call back after MRI results done.   11/26: Discussed osteomyelitis with ID- given no abscess, Dr. Tommy Medal recommends continuation of Cefazolin at this time. NSGY recommends to follow ID recommendations. May need extension of short-term disability until at least 1/1. Will need home wound care and wheelchair cushion- family would  appreciate SW follow-up on Monday to discuss these.   11/28- will verify with SW about wound care supplies as well as new dx of osteomyelitis of coccyx.  7. Fluids/Electrolytes/Nutrition: Monitor I/Os 8. MSSA bacteremia: On ancef with end date 12/01  11/24- weekly labs looking OK. Will double check ESR/CRP since has wound.  9. UGIB/melena: Has received 11 units PRBC during admission --Continue protonix BID w/ carafate ac/hs.  --Monitor for signs of bleeding. Hemoglobin 8.4 on 11/16, labs ordered for Monday  11/21- Hb 9.0- doing better- con't to monitor 10. T2DM: Po intake poor --continue TF at nights. Continue insulin gargline 12 units at nights Slightly labile on 11/19, monitor for trend.   11/21- BG's variable- will monitor for trend  11/22- Bgs doing a little better- drinking a lot of apple juice- likely the cause 11/23- saw 4 apple juice cups on tray- likely the cause- advised ot decrease juice intake.   11/24- Bgs labile- asked pt again to avoid juices 11/26: CBGs 97-184. Will provide with list of foods to help with diabetes control.  11/27: CBG down to 84- d/c ISS but continue to check CBGS so we can further adjust regimen as needed 11. HTN: Monitor BP TID--continue Norvasc and metoprolol for control.   11/23- occ mild elevation- will con't regimen for now and follow trneds  11/27: hypotensive: decrease Norvasc to 35m.   11/28- BP slightly elevated, but Norvasc decreased due to low BP, so will leave alone.  12. ABLA: On aranesp weekly for supplement.  See #9 13. Acute renal insufficiency- Creatinine 1.41 on 11/17, labs ordered for Monday  11/21- Cr 1.23- and BUN 14- con't to monitor 14.  Supplemental oxygen dependent/acute resp failure  11/7- will wean O2 as able 15. Pulmonary edema vs B/L pneumonia  11/9- will give Lasix 40 mg daily x 3 days and KCL 40 mEq x3 days as well since K+ is borderline low.   11/15- CXR looks worse- B/L pulmonary edema vs pneumonia, hospitalist  consulted  11/16- pulmonary effusions- due to bedrest- asked for flutter valve and pt to sit up as much as possible. Considering unstageable wound on backside  11/17- pt using flutter valve and ICS0 encouraged pt to use q1 hour while awake.   11/22- down to 0.5 L of O2- will con't to  wean  11/23- off O2! 16. Urinary retention- 11/8-started Flomax- foley out- in/out caths  11/24- is refusing caths when PVRs 350cc- will d/w pt.  17. Pressure ulcer on sacrum/coccyx- unstageable  11/11- ordered [facility]_ they are starting hydrotherapy M/W/F and wound care by nursing with santyl and saline gauze and foam dressing.   11/12- hydrotherapy went well  11/21- going well- still 95% slough- con't regimen  11/22- con't hydrotherapy 3 days/week  11/24- no progressing per wound care- surgery said no intervention- will d/w pt about calling ID  11/25- called ID- getting sacral MRI with and without contrast- need to call ID back with results.   11/28- has osteomyelitis of coccyx, but no abscess- extended IV ABX til 10/25/21 18.  Hyponatremia  Sodium 131 on 11/17, labs ordered for Monday  11/21- Na 134- con't to monitor 19. Bowel incontinence/frequency- will sopt Senokot- con't miralax.   11/24- no better yet- will con't new regimen  11/25- will make miralax prn   I spent a total of 33 minutes on total care >50% coordination of car-e going over d/c issues, regarding wound care and checking with SW about equipment, etc.    LOS: 22 days A FACE TO FACE EVALUATION WAS PERFORMED  Merranda Bolls 09/21/2021, 8:15 AM

## 2021-09-21 NOTE — Progress Notes (Signed)
Inpatient Rehabilitation Discharge Medication Review by a Pharmacist  A complete drug regimen review was completed for this patient to identify any potential clinically significant medication issues.  High Risk Drug Classes Is patient taking? Indication by Medication  Antipsychotic No   Anticoagulant No   Antibiotic Yes Cefazolin- osteo  Opioid Yes MS contin- pain  Antiplatelet No   Hypoglycemics/insulin Yes Lantus- T2DM  Vasoactive Medication Yes Lopressor, norvasc- HTN  Chemotherapy No   Other Yes Gabapentin- neuropathy Hydroxyzine, paxil- anxiety/MDD Mirtazapine- sleep/MDD     Type of Medication Issue Identified Description of Issue Recommendation(s)  Drug Interaction(s) (clinically significant)     Duplicate Therapy     Allergy     No Medication Administration End Date     Incorrect Dose     Additional Drug Therapy Needed     Significant med changes from prior encounter (inform family/care partners about these prior to discharge).    Other  Lipitor not continued at discharge Lucentis not continued at discharge Lipitor- Follow-up with PCP to assess continued need for medication Lucentis- administered by Ophthamologist    Clinically significant medication issues were identified that warrant physician communication and completion of prescribed/recommended actions by midnight of the next day:  No   Time spent performing this drug regimen review (minutes):  30   Mike Berntsen BS, PharmD, BCPS Clinical Pharmacist 09/21/2021 9:59 AM

## 2021-09-21 NOTE — Progress Notes (Signed)
Physical Therapy Wound Treatment Patient Details  Name: Jose [name] MRN: [id number] Date of Birth: [birth year]  Today's Date: 09/21/2021 PT Individual Time: 1007-1100 PT Individual Time Calculation (min): 53 min   Subjective  Subjective: Still pretty sore Patient and Family Stated Goals: get the wound healed and do well with therapies. Date of Onset:  (unknown) Prior Treatments:  (unknown)  Pain Score: Pain Score: 4   Wound Assessment  Pressure Injury 08/10/21 Coccyx Medial Unstageable - Full thickness tissue loss in which the base of the injury is covered by slough (yellow, tan, gray, green or brown) and/or eschar (tan, brown or black) in the wound bed. (Active)  Wound Image   09/21/21 1134  Dressing Type Barrier Film (skin prep);Foam - Lift dressing to assess site every shift;Gauze (Comment);Moist to dry;Dakin's-soaked gauze 09/21/21 1134  Dressing Changed;Intact 09/21/21 1134  Dressing Change Frequency Daily 09/21/21 1134  State of Healing Early/partial granulation 09/21/21 1134  Site / Wound Assessment Clean;Yellow 09/21/21 1134  % Wound base Red or Granulating 50% 09/21/21 1134  % Wound base Yellow/Fibrinous Exudate 50% 09/21/21 1134  % Wound base Black/Eschar 0% 09/21/21 1134  % Wound base Other/Granulation Tissue (Comment) 0% 09/21/21 1134  Peri-wound Assessment Excoriated;Induration 09/21/21 1134  Wound Length (cm) 7 cm 09/16/21 2052  Wound Width (cm) 3.5 cm 09/16/21 2052  Wound Depth (cm) 3 cm 09/16/21 2052  Wound Surface Area (cm^2) 24.5 cm^2 09/16/21 2052  Wound Volume (cm^3) 73.5 cm^3 09/16/21 2052  Margins Unattached edges (unapproximated) 09/20/21 1100  Drainage Amount Moderate 09/21/21 1134  Drainage Description Serous;Other (Comment) 09/21/21 1134  Treatment Cleansed;Debridement (Selective);Hydrotherapy (Pulse lavage) 09/21/21 1134      Hydrotherapy Pulsed lavage therapy - wound location: sacral/coccyx Pulsed Lavage with Suction (psi): 8 psi (4-12) Pulsed  Lavage with Suction - Normal Saline Used: 1000 mL Pulsed Lavage Tip: Tip with splash shield Selective Debridement Selective Debridement - Location: sacral/coccyx Selective Debridement - Tools Used: Scalpel;Forceps Selective Debridement - Tissue Removed: Thick yellow eschar covering wound base   Wound Assessment and Plan  Wound Therapy - Assess/Plan/Recommendations Wound Therapy - Clinical Statement: Wound improving slowly. Yellow slough and scar tissue remains adherent with difficult debridement.. Pt will benefit from further selective debridement and Santyl to remove necrotic tissue  when not using Dakin.  DAKIN doing it's job with normalizing exudate.  Pt participated in extensive dressing change instruction and should be able to help his wife problem-solve. Wound Therapy - Functional Problem List: pain limiting bed mobility and OOB activity. Factors Delaying/Impairing Wound Healing: Diabetes Mellitus;Multiple medical problems Hydrotherapy Plan: Debridement;Dressing change;Pulsatile lavage with suction;Patient/family education Wound Therapy - Frequency: 3X / week Wound Therapy - Current Recommendations: PT Wound Therapy - Follow Up Recommendations: dressing changes by RN;dressing changes by family/patient  Wound Therapy Goals- Improve the function of patient's integumentary system by progressing the wound(s) through the phases of wound healing (inflammation - proliferation - remodeling) by: Decrease Necrotic Tissue to: 20% Decrease Necrotic Tissue - Progress: Progressing toward goal Increase Granulation Tissue to: 80% Increase Granulation Tissue - Progress: Progressing toward goal Improve Drainage Characteristics: Min;Serous Improve Drainage Characteristics - Progress: Progressing toward goal Goals/treatment plan/discharge plan were made with and agreed upon by patient/family: Yes Time For Goal Achievement: 7 days Wound Therapy - Potential for Goals: Good  Goals will be updated until  maximal potential achieved or discharge criteria met.  Discharge criteria: when goals achieved, discharge from hospital, MD decision/surgical intervention, no progress towards goals, refusal/missing three consecutive treatments without notification or medical reason.  GP     Tessie Fass Robt Okuda 09/21/2021, 11:39 AM 09/21/2021  Ginger Carne., PT Acute Rehabilitation Services [phone]  (pager) [phone]  (office)

## 2021-09-21 NOTE — Progress Notes (Signed)
Patient ID: Jose [name], male   DOB: [birth year], 45 y.o.   MRN: [id number]  Met with pt and wife who is here for discharge instructions. Informed [employer] does not have the 18x18 wheelchair in stock but once does will switch out the 20x18 that was delivered to his room to take home today. Discussed again there is no home health agency that will take their insurance and provide [facility] for wound. Wife became upset and raised her voice and stated: " You all can pay for when he comes back in I do not want to do the wound care." Asked her if she wanted bedside RN to show her again and she could demonstrate for her. She responded no. She has called her insurance and will talk to the top person to get the services she needs. Discussed they have the benefit but the issue is no agency beng able to take it for staffing reasons or inability to provide the care he requires. Wife is not happy with this answer and she will continue to pursue on her own. Pam-PA aware wife is here and will go over the DC instructions with her. Will have 4 pm IV antibiotic dose at home today.

## 2021-09-21 NOTE — Progress Notes (Signed)
INPATIENT REHABILITATION DISCHARGE NOTE   Discharge instructions by: Jeannene Patella, PA  Verbalized understanding:  Skin care/Wound care healing? Hydrotherapy done this morning. Dressing on left toe changed by RN.   Pain: PRN medicine given this morning   IV's: PICC RUE  Tubes/Drains:  O2: room air  Safety instructions:  Patient belongings: belongings gathered by patient's wife  Discharged to: home    Discharged via: private vehicle   Notes: medications brought up by pharmacy. RN offered to give wound education to patient's wife. She refused education. Stated she had already been educated by different staff member a few days ago. PT gave patient extensive education on wound care this morning after hydrotherapy.

## 2021-09-21 NOTE — Plan of Care (Signed)
  Problem: RH Toileting Goal: LTG Patient will perform toileting task (3/3 steps) with assistance level (OT) Description: LTG: Patient will perform toileting task (3/3 steps) with assistance level (OT)  Outcome: Not Met (add Reason) Note: Pt still with incontinent episodes and needing A for hygiene, therefore goal was unable to be met. Pt reports that his spouse can assist as needed at home   Problem: RH Toilet Transfers Goal: LTG Patient will perform toilet transfers w/assist (OT) Description: LTG: Patient will perform toilet transfers with assist, with/without cues using equipment (OT) Outcome: Not Met (add Reason) Note: Pt still requires CGA for toilet transfers at this time and therefore goal unable to be met   Problem: RH Tub/Shower Transfers Goal: LTG Patient will perform tub/shower transfers w/assist (OT) Description: LTG: Patient will perform tub/shower transfers with assist, with/without cues using equipment (OT) Outcome: Not Met (add Reason) Note: Pt not medically cleared to shower still due to tunneling sacral wound, pt aware that he must complete BADL routine bedlevel/EOB for the time being

## 2021-09-21 NOTE — Discharge Summary (Signed)
Physician Discharge Summary  Patient ID: Jose [name] MRN: [id number] DOB/AGE: Sep 05, 1976 45 y.o.  Admit date: 08/30/2021 Discharge date: 09/21/2021  Discharge Diagnoses:  Principal Problem:   Thoracic spondylosis with myelopathy Active Problems:   MSSA bacteremia   Osteomyelitis of thoracic region (HCC)   Debility   Hyponatremia   AKI (acute kidney injury) ([place])   Acute blood loss anemia   GIB (gastrointestinal bleeding)   Duodenal ulcer   Esophagitis   Decubitus ulcer of sacral region, unstageable ([place])   Osteomyelitis of sacrum ([place])   Discharged Condition: good  Significant Diagnostic Studies: DG Chest 2 View  Result Date: 08/31/2021 CLINICAL DATA:  Constipation, follow-up exam Hx diabetes Patient reports not having a bowel movement in the past 2 days EXAM: ABDOMEN - 1 VIEW; CHEST - 2 VIEW COMPARISON: CHEST X-RAY: COMPARISON: CHEST X-RAY 08/27/2021, CT chest 08/06/2021 FINDINGS: Right PICC with tip overlying distal superior vena cava. The heart and mediastinal contours are within normal limits. Prominence of the hilar vasculature. Airspace opacity of the left upper left lower lobes. Increased interstitial markings. No pleural effusion. No pneumothorax. The bowel gas pattern is normal. No radio-opaque calculi or other significant radiographic abnormality are seen. Vascular calcifications within the pelvis. No acute osseous abnormality. IMPRESSION: 1. Pulmonary edema with likely persistent left lung infection/inflammation. 2. Nonobstructive bowel gas pattern. Electronically Signed   By: Iven Finn M.D.   On: 08/31/2021 15:32   DG Chest 2 View  Result Date: 08/27/2021 CLINICAL DATA:  Short of breath EXAM: CHEST - 2 VIEW COMPARISON:  08/23/2021 FINDINGS: Right arm PICC tip in the SVC unchanged. Feeding tube in the gastric antrum. Hypoventilation with decreased lung volumes. Small left pleural effusion. No pneumothorax Mild right lower lobe atelectasis unchanged. IMPRESSION:  Hypoventilation. Progression of airspace disease on the left likely due to atelectasis and/or infiltrate. Electronically Signed   By: Franchot Gallo M.D.   On: 08/27/2021 09:41   DG Abd 1 View  Result Date: 08/31/2021 CLINICAL DATA:  Constipation, follow-up exam Hx diabetes Patient reports not having a bowel movement in the past 2 days EXAM: ABDOMEN - 1 VIEW; CHEST - 2 VIEW COMPARISON: CHEST X-RAY: COMPARISON: CHEST X-RAY 08/27/2021, CT chest 08/06/2021 FINDINGS: Right PICC with tip overlying distal superior vena cava. The heart and mediastinal contours are within normal limits. Prominence of the hilar vasculature. Airspace opacity of the left upper left lower lobes. Increased interstitial markings. No pleural effusion. No pneumothorax. The bowel gas pattern is normal. No radio-opaque calculi or other significant radiographic abnormality are seen. Vascular calcifications within the pelvis. No acute osseous abnormality. IMPRESSION: 1. Pulmonary edema with likely persistent left lung infection/inflammation. 2. Nonobstructive bowel gas pattern. Electronically Signed   By: Iven Finn M.D.   On: 08/31/2021 15:32   CT CHEST W CONTRAST  Result Date: 09/08/2021 CLINICAL DATA:  Chronic dyspnea EXAM: CT CHEST WITH CONTRAST TECHNIQUE: Multidetector CT imaging of the chest was performed during intravenous contrast administration. CONTRAST:  30m OMNIPAQUE IOHEXOL 300 MG/ML  SOLN COMPARISON:  CT from 08/06/2021, chest x-ray from the previous day. FINDINGS: Cardiovascular: Thoracic aorta and its branches show no aneurysmal dilatation or dissection. Heart is at the upper limits of normal in size. Mild coronary calcifications are seen. The pulmonary artery as visualized is within normal limits although limited by timing of the contrast bolus. Right-sided PICC line is noted in the distal superior vena cava. Mediastinum/Nodes: Thoracic inlet demonstrates heterogeneity of the thyroid stable in appearance from the prior  exam. Scattered  stable subcarinal and right hilar lymph nodes are seen. Small mediastinal lymph nodes are seen. These are stable in appearance from the prior exam and likely reactive in nature. The esophagus as visualized appears within normal limits. Lungs/Pleura: Bilateral pleural effusions are noted left considerably greater than right. Associated lower lobe infiltrate is seen also left greater than right. These changes are relatively stable from the prior CT examination. Some increase in right upper lobe infiltrate is noted when compared with the prior exam. This would correspond with that seen on recent plain film examination. Upper Abdomen: Visualized upper abdomen shows no acute abnormality. Musculoskeletal: Persistent destructive changes are noted at T4-5 with continued vertebral body height loss at T5 when compared with the prior exam. Postsurgical changes are seen posteriorly consistent with the given clinical history. No acute rib abnormality is seen. Degenerative changes of the thoracic spine are noted. Postsurgical changes are noted in the soft tissues posteriorly consistent with the prior laminectomy. 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 The degree of bony loss at T5 has increased in the interval from the prior exam. Postsurgical changes are now seen. Bilateral pleural effusions left considerably greater than right which have increased in the interval from the prior exam. Bilateral lower lobe consolidation worse on the left than the right is seen with some patchy changes identified within the upper lobe on the left when compared with the prior study. This corresponds to that seen on recent plain film examination consistent with progressive pneumonia. Persistent reactive adenopathy is seen. Electronically Signed   By: Inez Catalina M.D.   On: 09/08/2021 20:44   MR SACRUM SI JOINTS W WO CONTRAST  Result Date: 09/18/2021 CLINICAL DATA:  Coccygeal pressure ulcer.  Thoracic  osteomyelitis. EXAM: MRI SACRUM WITHOUT CONTRAST TECHNIQUE: Multiplanar multi-sequence MR imaging of the sacrum was performed. No intravenous contrast was administered. COMPARISON:  Pelvic CT 08/06/2021. FINDINGS: Despite efforts by the technologist and patient, mild to moderate motion artifact is present on today's exam and could not be eliminated. This reduces exam sensitivity and specificity. Bones/Joint/Cartilage There is a deep soft tissue ulcer over the distal coccyx which extends to bone. There is associated abnormal decreased T1 and increased T2 marrow signal within the distal 2 coccygeal segments with diffuse marrow enhancement following contrast, consistent with osteomyelitis. The sacrum, sacroiliac joints and remainder of the visualized bony pelvis appear unremarkable. No significant femoral head or hip joint abnormalities. Ligaments Not relevant for exam/indication. Muscles and Tendons Mild generalized muscular T2 hyperintensity without focal fluid collection or abnormal enhancement. Soft tissue As above, deep soft tissue decubitus ulcer over the distal coccyx with surrounding subcutaneous enhancement. No drainable fluid collection. Enhancement extends into the presacral space without associated focal fluid collection. Mild generalized subcutaneous edema, similar to previous CT. Previously demonstrated bladder distension appears mildly improved. IMPRESSION: 1. Deep decubitus ulcer over the distal coccyx with associated marrow changes and enhancement in the distal 2 coccygeal segments consistent with osteomyelitis. 2. The sacrum, sacroiliac joints and remainder of the visualized bony pelvis appear unremarkable. 3. No drainable abscess identified. Electronically Signed   By: Richardean Sale M.D.   On: 09/18/2021 19:29   DG CHEST PORT 1 VIEW  Result Date: 09/07/2021 CLINICAL DATA:  Shortness of breath EXAM: PORTABLE CHEST 1 VIEW COMPARISON:  Previous studies including the examination of 08/31/2021  FINDINGS: Transverse diameter of heart is increased. Tip of PICC line is seen in the superior vena cava. There is interval increase in infiltrates in the left parahilar region.  Central pulmonary vessels are prominent. Increased interstitial and alveolar densities seen in the right parahilar region and right lower lung fields. There is homogeneous opacity in the medial left lower lung field obscuring the left hemidiaphragm. There is blunting of left lateral CP angle. There is no pneumothorax. IMPRESSION: There is interval worsening of infiltrates in the left parahilar region. There is interval increase in interstitial and alveolar markings in the right parahilar region. Findings suggest worsening of pulmonary edema or worsening of bilateral pneumonia. Homogeneous opacity in the medial left lower lung fields may suggest atelectasis/pneumonia in the left lower lobe. Small left pleural effusion is seen. Electronically Signed   By: Elmer Picker M.D.   On: 09/07/2021 09:51   DG CHEST PORT 1 VIEW  Result Date: 08/23/2021 CLINICAL DATA:  Chest pain. History of discitis and osteomyelitis in the thoracic spine. EXAM: PORTABLE CHEST 1 VIEW COMPARISON:  Chest x-ray 08/21/2021 FINDINGS: The right-sided PICC line is stable.  The feeding tube is stable. Persistent diffuse but asymmetric interstitial and airspace process in the lungs. No pleural effusions or pneumothorax. IMPRESSION: Persistent diffuse but asymmetric interstitial and airspace process. Electronically Signed   By: Marijo Sanes M.D.   On: 08/23/2021 15:09   VAS [place] LOWER EXTREMITY VENOUS (DVT)  Result Date: 09/01/2021  Lower Venous DVT Study Patient Name:  GADGE HERMIZ  Date of Exam:   08/31/2021 Medical Rec #: [id number]        Accession #:    [id number] Date of Birth: 05-22-1976        Patient Gender: M Patient Age:   68 years Exam Location:  [facility] Procedure:      VAS [place] LOWER EXTREMITY VENOUS (DVT) Referring Phys: Quenton Recendez  --------------------------------------------------------------------------------  Indications: Edema.  Comparison Study: 08/21/2021- Negative lower extremity venous duplex Performing Technologist: Maudry Mayhew MHA, RDMS, RVT, RDCS  Examination Guidelines: A complete evaluation includes B-mode imaging, spectral Doppler, color Doppler, and power Doppler as needed of all accessible portions of each vessel. Bilateral testing is considered an integral part of a complete examination. Limited examinations for reoccurring indications may be performed as noted. The reflux portion of the exam is performed with the patient in reverse Trendelenburg.  +---------+---------------+---------+-----------+----------+--------------+ RIGHT    CompressibilityPhasicitySpontaneityPropertiesThrombus Aging +---------+---------------+---------+-----------+----------+--------------+ CFV      Full           Yes      Yes                                 +---------+---------------+---------+-----------+----------+--------------+ SFJ      Full                                                        +---------+---------------+---------+-----------+----------+--------------+ FV Prox  Full                                                        +---------+---------------+---------+-----------+----------+--------------+ FV Mid   Full                                                        +---------+---------------+---------+-----------+----------+--------------+  FV DistalFull                                                        +---------+---------------+---------+-----------+----------+--------------+ PFV      Full                                                        +---------+---------------+---------+-----------+----------+--------------+ POP      Full           Yes      Yes                                 +---------+---------------+---------+-----------+----------+--------------+ PTV       Full                                                        +---------+---------------+---------+-----------+----------+--------------+ PERO     Full                                                        +---------+---------------+---------+-----------+----------+--------------+   +---------+---------------+---------+-----------+----------+--------------+ LEFT     CompressibilityPhasicitySpontaneityPropertiesThrombus Aging +---------+---------------+---------+-----------+----------+--------------+ CFV      Full           Yes      Yes                                 +---------+---------------+---------+-----------+----------+--------------+ SFJ      Full                                                        +---------+---------------+---------+-----------+----------+--------------+ FV Prox  Full                                                        +---------+---------------+---------+-----------+----------+--------------+ FV Mid   Full                                                        +---------+---------------+---------+-----------+----------+--------------+ FV DistalFull                                                        +---------+---------------+---------+-----------+----------+--------------+  PFV      Full                                                        +---------+---------------+---------+-----------+----------+--------------+ POP      Full           Yes      Yes                                 +---------+---------------+---------+-----------+----------+--------------+ PTV      Full                                                        +---------+---------------+---------+-----------+----------+--------------+ PERO     Full                                                        +---------+---------------+---------+-----------+----------+--------------+     Summary: BILATERAL: - No evidence of deep vein  thrombosis seen in the lower extremities, bilaterally. -No evidence of popliteal cyst, bilaterally.   *See table(s) above for measurements and observations. Electronically signed by Deitra Mayo MD on 09/01/2021 at 7:35:46 AM.    Final    [place] EKG SITE RITE  Result Date: 09/18/2021 If Site Rite image not attached, placement could not be confirmed due to current cardiac rhythm.   Labs:  Basic Metabolic Panel: BMP Latest Ref Rng & Units 09/21/2021 09/14/2021 09/10/2021  Glucose 70 - 99 mg/dL 137(H) 167(H) 110(H)  BUN 6 - 20 mg/dL _0 Creatinine 0.61 - 1.24 mg/dL 1.36(H) 1.23 1.41(H)  BUN/Creat Ratio 9 - 20 - - -  Sodium 135 - 145 mmol/L 132(L) 134(L) 131(L)  Potassium 3.5 - 5.1 mmol/L 3.7 4.6 4.2  Chloride 98 - 111 mmol/L 99 98 96(L)  CO2 22 - 32 mmol/L 25 32 28  Calcium 8.9 - 10.3 mg/dL 8.4(L) 8.4(L) 8.4(L)     CBC: CBC Latest Ref Rng & Units 09/21/2021 09/14/2021 09/09/2021  WBC 4.0 - 10.5 K/uL 7.5 8.7 6.6  Hemoglobin 13.0 - 17.0 g/dL 9.8(L) 9.0(L) 8.4(L)  Hematocrit 39.0 - 52.0 % 31.5(L) 29.1(L) 27.0(L)  Platelets 150 - 400 K/uL 304 268 269     CBG: Recent Labs  Lab 09/20/21 1111 09/20/21 1613 09/20/21 2120 09/21/21 0622 09/21/21 1143  GLUCAP 175* 121* 164* 124* 198*    Brief HPI:   JACE FERMIN is a 45 y.o. male with history of T2DM with neuropathy and retinopathy, CKD, 6 weeks history of chest pain radiating to bilateral shoulders who was recently admitted to [facility] long hospital and found to have lytic lesions T5-6, T6 and sternum with pathological fracture of T6 as well as MSSA bacteremia and was discharged to home on IV antibiotics.  He was readmitted 4 days later on 08/08/2019 with worsening of pain and found to have increase in paraspinal phlegmon with new epidural phlegmon and interval T5 pathological fracture with severe left eccentric's canal  stenosis and overall findings likely secondary to progressive discitis/osteomyelitis.  He was taken to the OR  for T4 and T5 laminectomy with left T5 pediculotomy for decompression of spinal cord by Dr. Kathyrn Sheriff.  Work-up was negative for multiple myeloma and bone biopsy negative for malignancy.  Hospital course was significant for acute blood loss anemia drop in hemoglobin to 6.2 with hypoxia and confusion.  He was found to have a multifocal PNA due to aspiration and required BiPAP as well as 2 units PRBC.  He underwent UGI by Dr. Alessandra Bevels on 10/17 revealing severe esophagitis with black discoloration of mucosa, large amount of retained coffee-ground fluid in stomach which was suctioned as well as few nonbleeding superficial duodenal ulcers without stigmata of bleeding.  He was started on IV Protonix and transition to p.o. twice daily with recommendations to continue this for 2 months followed by daily dose.  He was started on fluconazole for Candida esophagitis.   VQ scan done showing large left perfusion defect in LUL not typical of PE and chest CT not done due due to acute on chronic renal failure.  BLE Dopplers were negative for DVT and respiratory status was improving.  He did develop recurrent tarry stools and underwent EGD showing improvement in esophagitis with bilious fluid in stomach no gastric or duodenal ulcer.  No further GI work-up planned and patient not anticoagulated due to concerns of rebleeding.   Bloody stools have resolved and H&H is stable.  He was transitioned to Ancef with end date of 12/01 and continued on tube feeds due to poor p.o. intake with episodic left hypoglycemia.  He continued to be limited by back pain with weakness affecting ADLs and mobility.  CIR was recommended due to functional decline.   Hospital Course: MIZAEL SAGAR was admitted to rehab 08/30/2021 for inpatient therapies to consist of PT and OT at least three hours five days a week. Past admission physiatrist, therapy team and rehab RN have worked together to provide customized collaborative inpatient rehab. BLE  dopplers were negative for DVT and SCDs used during his stay. His blood pressures were monitored on TID basis and he was noted to have hypotension with mild occasional elevation therefore amlodipine was decreased to 5 mg daily. Serial check of BMET showed that acute on chronic renal failure to be at baseline and he is to reschedule his nephrology follow up after discharge. Hyponatremia is stable and abnormal LFTs have resolved except for elevated Alk phos. GIB has resolved and serial CBC showed  H/H to be slowly improving with resolution of leucocytosis. His back incision is C/D/I but he was found to have non-stagable sacral decub with pain at admission. Air mattress overlay and santyl with wet to dry dressing was ongoing with change in eschar from black to adherent yellow and decrease in pain.   [facility] has been following for input and recommended hydrotherapy which was started on MWF basis with improvement in wound. Dakins solution has been used on and off to clear slough and followed by santyl with damp to dry dressing. Surgery was reconsulted for input and felt that wound was progressing as anticipated based on pictures. They recommended increasing frequency of dressing changes as well as follow up with Wound clinic at discharge. Dressing changes were increased to BID. Today's hydrotherapy notes indicate that wound is 7 cm X 3.5 cm X 3 cm with serous drainage. Wound base is 50% red and granulating as well as 50% yellow fibrinous tissue. Wife has been instructed  on dressing changes as well as importance of nutrition to help promote healing. Follow up CXR showed BLE pulmonary edema with was treated with lasix X 3 days without improvement. [facility] hospitalist were consulted for input and recommended pulmonary hygiene as effusions felt to be due to bedrest. He has been compliant with flutter valve use and has been weaned off oxygen as endurance improved.    Constipation has resolved and foley was removed with  initiation of bladder program. He had difficulty voiding with urinary retention as well as dyuria. Voiding function was monitored with PVR checks. Flomax and low dose urecholine added with resolution of retention.  His diabetes has been monitored with ac/hs CBG checks and SSI was use prn for tighter BS control. Po intake has been variable and wife has been supplementing with food from home. Nutritional supplements were offered to help promote wound healing and supplement low protein stores. MS Contin was added to help with pain relief and he had decreased frequency of percocet use with this addition.  Anxiety has been managed with addition of Paxil as well as use of xanax prn. Dr. Sima Matas has worked with patient on coping and adjustment issues. Patient has made steady gains and currently able to ambulate household distances with supervision. HHRN arranged to help with antibiotic management only as has declined to offer wound care due to insurance constraints.      Rehab course: During patient's stay in rehab weekly team conferences were held to monitor patient's progress, set goals and discuss barriers to discharge. At admission, patient required max assist with ADL tasks and mod assist with mobility. He  has had improvement in activity tolerance, balance, postural control as well as ability to compensate for deficits. He is able to complete ADL tasks with supervision. He requires supervision for transfers and to ambulate 400' with RW and cues for safety. He has been limited by right shoulder pain and WC recommended for energy conservation. Family education has been completed with wife.    Disposition: Home  Diet: Carb modified.   Special Instructions:  Discharge Instructions     [facility] pharmacist to adjust dose for Vancomycin, Aminoglycosides and other anti-infective therapies as requested by physician.   Complete by: As directed    [facility] to provide Cath Flo 80m    Complete by: As directed    Administer for PICC line occlusion and as ordered by physician for other access device issues.   Ambulatory referral to Physical Medicine Rehab   Complete by: As directed    Hospital follow up   Anaphylaxis Kit: Provided to treat any anaphylactic reaction to the medication being provided to the patient if First Dose or when requested by physician   Complete by: As directed    Epinephrine 157mml vial / amp: Administer 0.69m2m0.69ml34mubcutaneously once for moderate to severe anaphylaxis, nurse to call physician and pharmacy when reaction occurs and call 911 if needed for immediate care   Diphenhydramine 50mg67mIV vial: Administer 25-50mg 6mM PRN for first dose reaction, rash, itching, mild reaction, nurse to call physician and pharmacy when reaction occurs   Sodium Chloride 0.9% NS 500ml I39mdminister if needed for hypovolemic blood pressure drop or as ordered by physician after call to physician with anaphylactic reaction   Change dressing on IV access line weekly and PRN   Complete by: As directed    Flush IV access with Sodium Chloride 0.9% and Heparin 10 units/ml or 100 units/ml  Complete by: As directed    Home infusion instructions - [facility]   Complete by: As directed    Instructions: Flush IV access with Sodium Chloride 0.9% and Heparin 10units/ml or 100units/ml   Change dressing on IV access line: Weekly and PRN   Instructions Cath Flo 50m: Administer for PICC Line occlusion and as ordered by physician for other access device   [facility] pharmacist to adjust dose for: Vancomycin, Aminoglycosides and other anti-infective therapies as requested by physician   Method of administration may be changed at the discretion of home infusion pharmacist based upon assessment of the patient and/or caregiver's ability to self-administer the medication ordered   Complete by: As directed    Outpatient Parenteral Antibiotic Therapy Information  Antibiotic: Cefazolin (Ancef) IVPB; Indications for use: vertebral diskitis; End Date: 09/24/2021   Complete by: As directed    Antibiotic: Cefazolin (Ancef) IVPB   Indications for use: vertebral diskitis   End Date: 09/24/2021      Allergies as of 09/21/2021       Reactions   Cranberry Itching   Hm Lidocaine Patch [lidocaine] Dermatitis   Blisters skin    Melatonin Other (See Comments)   nightmares        Medication List     STOP taking these medications    atorvastatin 40 MG tablet Commonly known as: LIPITOR   ceFAZolin  IVPB Commonly known as: ANCEF   cyclobenzaprine 10 MG tablet Commonly known as: FLEXERIL   Darbepoetin Alfa 150 MCG/0.3ML Sosy injection Commonly known as: ARANESP   feeding supplement (PROSource TF) liquid   feeding supplement (VITAL 1.5 CAL) Liqd Replaced by: nutrition supplement (JUVEN) Pack   insulin aspart 100 UNIT/ML injection Commonly known as: novoLOG   insulin glargine-yfgn 100 UNIT/ML injection Commonly known as: SEMGLEE   Lucentis 0.3 MG/0.05ML Soln Generic drug: Ranibizumab   tobramycin 0.3 % ophthalmic solution Commonly known as: TOBREX   traZODone 100 MG tablet Commonly known as: DESYREL   VITAMIN D PO       TAKE these medications    acetaminophen 325 MG tablet Commonly known as: TYLENOL Take 1-2 tablets (325-650 mg total) by mouth every 4 (four) hours as needed for mild pain. What changed:  medication strength how much to take when to take this reasons to take this   albuterol 108 (90 Base) MCG/ACT inhaler Commonly known as: VENTOLIN HFA Inhale 1 puff into the lungs every 4 (four) hours as needed for wheezing or shortness of breath.   ALPRAZolam 0.25 MG tablet--Rx# 30 pills Commonly known as: XANAX Take 1 tablet (0.25 mg total) by mouth 2 (two) times daily as needed for anxiety.   amLODipine 10 MG tablet Commonly known as: NORVASC Take 0.5 tablets (5 mg total) by mouth daily. What changed: how much to  take   bethanechol 10 MG tablet Commonly known as: URECHOLINE Take 1 tablet (10 mg total) by mouth 3 (three) times daily.   bisacodyl 10 MG suppository Commonly known as: DULCOLAX Place 1 suppository (10 mg total) rectally daily as needed for moderate constipation.   ceFAZolin 2-4 GM/100ML-% IVPB Commonly known as: ANCEF Inject 100 mLs (2 g total) into the vein every 8 (eight) hours.   collagenase ointment Commonly known as: SANTYL Apply topically daily. Start taking on: September 23, 2021 What changed: These instructions start on September 23, 2021. If you are unsure what to do until then, ask your doctor or other care provider.   gabapentin 100 MG  capsule Commonly known as: NEURONTIN Take 2 capsules (200 mg total) by mouth 2 (two) times daily.   Glydo 2 % jelly Generic drug: lidocaine Place 1 application into the urethra every 6 (six) hours.   hydrOXYzine 10 MG tablet Commonly known as: ATARAX/VISTARIL Take 1 tablet (10 mg total) by mouth 3 (three) times daily as needed for anxiety.   Lantus SoloStar 100 UNIT/ML Solostar Pen Generic drug: insulin glargine Inject 8 Units into the skin at bedtime.   insulin glargine 100 UNIT/ML Solostar Pen Commonly known as: LANTUS Inject 8 Units into the skin at bedtime.   methocarbamol 500 MG tablet Commonly known as: ROBAXIN Take 1 tablet (500 mg total) by mouth every 6 (six) hours as needed for muscle spasms.   metoprolol tartrate 50 MG tablet Commonly known as: LOPRESSOR Take 1 tablet (50 mg total) by mouth 2 (two) times daily.   mirtazapine 15 MG tablet Commonly known as: REMERON Take 1 tablet (15 mg total) by mouth at bedtime.   morphine 15 MG 12 hr tablet--Rx# 14 pills Commonly known as: MS CONTIN Take 1 tablet (15 mg total) by mouth every 12 (twelve) hours.   MULTIVITAMIN ADULT PO Take 1 tablet by mouth daily.   mupirocin ointment 2 % Commonly known as: BACTROBAN Apply 1 application topically 2 (two) times daily.    Narcan 4 MG/0.1ML Liqd nasal spray kit Generic drug: naloxone Use as needed for signs of overdose.   nutrition supplement (JUVEN) Pack Take 1 packet by mouth 2 (two) times daily between meals. Replaces: feeding supplement (VITAL 1.5 CAL) Liqd   oxyCODONE-acetaminophen 5-325 MG tablet--Rx #56 pills Commonly known as: PERCOCET/ROXICET Take 1-2 tablets by mouth every 8 (eight) hours as needed for moderate pain. What changed:  how much to take when to take this Notes to patient: Limit to 4 pills per day (as at the hospital). Can use tylenol or oxycodone APAP   pantoprazole 40 MG tablet Commonly known as: PROTONIX Take 1 tablet (40 mg total) by mouth 2 (two) times daily.   PARoxetine 20 MG tablet Commonly known as: PAXIL Take 1 tablet (20 mg total) by mouth at bedtime.   Pentips 32G X 4 MM Misc Generic drug: Insulin Pen Needle use as directed   polyethylene glycol 17 g packet Commonly known as: MIRALAX / GLYCOLAX Take 17 g by mouth daily as needed for moderate constipation, mild constipation or severe constipation.   Senexon-S 8.6-50 MG tablet Generic drug: senna-docusate Take 2 tablets by mouth at bedtime as needed for mild constipation. What changed:  how much to take when to take this reasons to take this   sucralfate 1 GM/10ML suspension Commonly known as: CARAFATE Take 10 mLs (1 g total) by mouth 4 (four) times daily -  with meals and at bedtime.   tamsulosin 0.4 MG Caps capsule Commonly known as: FLOMAX Take 1 capsule (0.4 mg total) by mouth daily after supper. What changed: when to take this               Discharge Care Instructions  (From admission, onward)           Start     Ordered   09/16/21 0000  Change dressing on IV access line weekly and PRN  (Home infusion instructions - [facility] )        09/16/21 1148            Follow-up Information     Lovorn, Jinny Blossom, MD Follow up.  Specialty: Physical Medicine and  Rehabilitation Why: office will call you with follow up appointment Contact information: [address] [place] [address] [phone]         Consuella Lose, MD. Call.   Specialty: Neurosurgery Why: for post op check Contact information: [address] [place] [address] [phone]         [place], [place]. Call.   Why: for follow up appointment Contact information: [place] [address] [phone]         [name], NP. Call.   Specialty: Family Medicine Why: for post hospital follow up Contact information: [place] [address] [place]              Follow up on 10/22/2021.   Why: 7:30 Am Will place on cancellation list if one opens earlier will call Contact information: [address] [place] [address] [phone]        Rosiland Oz, MD Follow up on 09/29/2021.   Specialty: Infectious Diseases Why: Keep this appointment as ID be following/managing antibiotic regimen. Contact information: [address] [place] [address] [phone]                 Signed: Bary Leriche 09/21/2021, 4:33 PM

## 2021-09-21 NOTE — Progress Notes (Signed)
Inpatient Rehabilitation Care Coordinator Discharge Note   Patient Details  Name: Jose [name] MRN: [id number] Date of Birth: [birth year]   Discharge location: HOME WITH WIFE WHO CAN PROVIDE 24/7 SUPERVISION  Length of Stay: 22 DAYS  Discharge activity level: SUPERVISION LEVEL  Home/community participation: ACTIVE  Patient response MO:QHUTML Literacy - How often do you need to have someone help you when you read instructions, pamphlets, or other written material from your doctor or pharmacy?: Never  Patient response YY:TKPTWS Isolation - How often do you feel lonely or isolated from those around you?: Never  Services provided included: MD, RD, PT, OT, RN, CM, TR, Pharmacy, SW  Financial Services:  Charity fundraiser Utilized: [employer]  Choices offered to/list presented to: PT AND WIFE  Follow-up services arranged:  Outpatient, DME, Patient/Family has no preference for HH/DME agencies    Outpatient Servicies: COULD NOT FIND A HH AGENCY TO ACCEPT PT'S CARE SO REFERRAL MADE TO [facility] & PT WILL CALL PT OR WIFE TO SET UP APPOINTMENTS DME : [facility] [place]. [facility] WILL ONLY MANAGE THE IV ANTIBIOTICS NOT THE WOUND    Patient response to transportation need: Is the patient able to respond to transportation needs?: Yes In the past 12 months, has lack of transportation kept you from medical appointments or from getting medications?: No In the past 12 months, has lack of transportation kept you from meetings, work, or from getting things needed for daily living?: No    Comments (or additional information):WIFE CONTINUES TO BE UPSET REGARDING HIS WOUND CARE AND HAVING TO BE THE PERSON TO PROVIDE THIS. ALL HOME HEALTH AGENCIES HAVE BEEN EXHAUSTED AND NOT ONE WOULD ACCEPT THE REFERRAL. EXTRA SUPPLIES GIVEN TO WIFE AND PRESCRIPTION GIVEN TO COVER THE COST OF  SUPPLIES  Patient/Family verbalized understanding of follow-up arrangements:  Yes  Individual responsible for coordination of the follow-up plan: ERIN-WIFE [phone]  Confirmed correct DME delivered: Elease Hashimoto 09/21/2021    Florette Thai, Gard[place] Rhyme

## 2021-09-21 NOTE — Progress Notes (Signed)
Patient ID: Jose [name], male   DOB: August 25, 1976, 45 y.o.   MRN: [id number]  Spoke with MD who asks for RN to have wife do dressing change again on pt's bottom to make sure feels comfortable with this. PT requesting wheelchair but not sure if will be covered due to ambulation. Have ordered via [employer] and asked for roho cushion and also switch out of hospital bed mattress with low air loss mattress due to sacral wound now. Have asked [employer] to contact wife to arrange delivery. Aware discharge is today. Have updated pam-IV antibiotics extended until 10/25/2021

## 2021-09-21 NOTE — Progress Notes (Signed)
Occupational Therapy Discharge Summary  Patient Details  Name: Jose [name] MRN: [id number] Date of Birth: 1976-04-11  Patient has met 7 of 10 long term goals due to improved activity tolerance, improved balance, postural control, ability to compensate for deficits, and improved coordination.  Patient to discharge at overall Supervision level.  Patient's care partner is independent to provide the necessary physical assistance at discharge.    Pt continues to require increased assistance for toilet transfers and toileting, he is not medically cleared to shower and therefore this was not attempted. These goal areas were unable to be met  Recommendation:  Patient will benefit from ongoing skilled OT services in outpatient or home health setting to continue to advance functional skills in the area of BADL.  Equipment: BSC  Reasons for discharge: treatment goals met and discharge from hospital  Patient/family agrees with progress made and goals achieved: Yes  OT Discharge ADL ADL Eating: Not assessed Grooming: Setup Where Assessed-Grooming: Edge of bed Upper Body Bathing: Setup Where Assessed-Upper Body Bathing: Bed level Lower Body Bathing: Supervision/safety Where Assessed-Lower Body Bathing: Bed level Upper Body Dressing: Setup Where Assessed-Upper Body Dressing: Edge of bed Lower Body Dressing: Supervision/safety Where Assessed-Lower Body Dressing: Edge of bed Toileting: Maximal assistance Toilet Transfer: Contact guard Toilet Transfer Method: Ambulating (RW) Tub/Shower Transfer: Not assessed ADL Comments: unable to assess transfer abilities due to heightened pain, very limited tolerance sitting EOB due to pain Vision Baseline Vision/History: 1 Wears glasses;6 Macular Degeneration;5 Retinopathy Patient Visual Report: No change from baseline Vision Assessment?: No apparent visual deficits Perception  Perception: Within Functional Limits Praxis Praxis:  Intact Cognition Orientation Level: Oriented X4 Immediate Memory Recall: Sock;Blue;Bed Memory Recall Sock: Without Cue Memory Recall Blue: Without Cue Memory Recall Bed: Without Cue Safety/Judgment: Appears intact Sensation Coordination Gross Motor Movements are Fluid and Coordinated: No Fine Motor Movements are Fluid and Coordinated: No Coordination and Movement Description: Guarded functional movements due to pain but greatly improved, bilateral hand tremors with activity/onset of anxiety Finger Nose Finger Test: Shakiness which pt attributes to feelings of anxiety Motor  Motor Motor: Abnormal postural alignment and control Motor - Discharge Observations: Global weakness and deconditioning 2/2 prolonged and complicated hospital course. Pain limiting but significantly improved Balance Balance Balance Assessed: Yes Dynamic Sitting Balance Dynamic Sitting - Balance Support: Feet unsupported Dynamic Sitting - Level of Assistance: 6: Modified independent (Device/Increase time) (donning overhead shirt) Dynamic Standing Balance Dynamic Standing - Balance Support: During functional activity;No upper extremity supported Dynamic Standing - Level of Assistance: 5: Stand by assistance Dynamic Standing - Balance Activities:  (LB dressing) Extremity/Trunk Assessment RUE Assessment RUE Assessment: Exceptions to [facility] General Strength Comments: significnatly improved pain in R shoulder, able to use functionally but pain >90* LUE Assessment LUE Assessment: Exceptions to [facility] General Strength Comments: generalized weakness present but greatly improved functional use from eval   Shaley Leavens A Shelda Truby 09/21/2021, 8:00 AM

## 2021-09-22 ENCOUNTER — Other Ambulatory Visit: Payer: Self-pay | Admitting: Physical Medicine and Rehabilitation

## 2021-09-22 MED ORDER — OXYCODONE-ACETAMINOPHEN 10-325 MG PO TABS: 1.0000 | ORAL_TABLET | Freq: Four times a day (QID) | ORAL | 0 refills | Status: DC | PRN

## 2021-09-22 MED ORDER — MORPHINE SULFATE ER 15 MG PO TBCR: 15.0000 mg | EXTENDED_RELEASE_TABLET | Freq: Two times a day (BID) | ORAL | 0 refills | Status: DC

## 2021-09-23 ENCOUNTER — Other Ambulatory Visit: Payer: Self-pay

## 2021-09-23 ENCOUNTER — Encounter (HOSPITAL_BASED_OUTPATIENT_CLINIC_OR_DEPARTMENT_OTHER): Payer: 59 | Attending: Physician Assistant | Admitting: Physician Assistant

## 2021-09-23 DIAGNOSIS — I129 Hypertensive chronic kidney disease with stage 1 through stage 4 chronic kidney disease, or unspecified chronic kidney disease: Secondary | ICD-10-CM | POA: Insufficient documentation

## 2021-09-23 DIAGNOSIS — E1122 Type 2 diabetes mellitus with diabetic chronic kidney disease: Secondary | ICD-10-CM | POA: Insufficient documentation

## 2021-09-23 DIAGNOSIS — M4628 Osteomyelitis of vertebra, sacral and sacrococcygeal region: Secondary | ICD-10-CM | POA: Insufficient documentation

## 2021-09-23 DIAGNOSIS — M6281 Muscle weakness (generalized): Secondary | ICD-10-CM | POA: Diagnosis not present

## 2021-09-23 DIAGNOSIS — N183 Chronic kidney disease, stage 3 unspecified: Secondary | ICD-10-CM | POA: Insufficient documentation

## 2021-09-23 DIAGNOSIS — E1169 Type 2 diabetes mellitus with other specified complication: Secondary | ICD-10-CM | POA: Diagnosis not present

## 2021-09-23 DIAGNOSIS — L0501 Pilonidal cyst with abscess: Secondary | ICD-10-CM | POA: Diagnosis not present

## 2021-09-23 DIAGNOSIS — L89153 Pressure ulcer of sacral region, stage 3: Secondary | ICD-10-CM | POA: Insufficient documentation

## 2021-09-23 DIAGNOSIS — E11622 Type 2 diabetes mellitus with other skin ulcer: Secondary | ICD-10-CM | POA: Diagnosis present

## 2021-09-23 DIAGNOSIS — Z87891 Personal history of nicotine dependence: Secondary | ICD-10-CM | POA: Insufficient documentation

## 2021-09-23 NOTE — Progress Notes (Signed)
ANUEL, SITTER ([id number]) Visit Report for 09/23/2021 Allergy List Details Patient Name: Date of Service: REA AAIDYN, SAN 09/23/2021 7:30 A M Medical Record Number: [id number] Patient Account Number: [id number] Date of Birth/Sex: Treating RN: 15-Jun-1976 (45 y.o. Marcheta Grammes Primary Care Marialuiza Car: Silvestre Gunner, JESSICA Other Clinician: Referring Lynzy Rawles: Treating Ram Haugan/Extender: Glenford Peers, JESSICA Weeks in Treatment: 0 Allergies Active Allergies cranberry Reaction: itching Severity: Mild melatonin Reaction: spasms, nightmares Allergy Notes Electronic Signature(s) Signed: 09/23/2021 5:26:09 PM By: Lorrin Jackson Entered By: Lorrin Jackson on 09/23/2021 08:03:54 -------------------------------------------------------------------------------- Arrival Information Details Patient Name: Date of Service: REA Cristal Deer. 09/23/2021 7:30 A M Medical Record Number: [id number] Patient Account Number: [id number] Date of Birth/Sex: Treating RN: 03-Jan-1976 (45 y.o. Marcheta Grammes Primary Care Juliannah Ohmann: Retia Passe Other Clinician: Referring Kalie Cabral: Treating Daylen Hack/Extender: Glenford Peers, JESSICA Weeks in Treatment: 0 Visit Information Patient Arrived: Wheel Chair Arrival Time: 08:02 Accompanied By: Wife Transfer Assistance: Manual Patient Identification Verified: Yes Secondary Verification Process Completed: Yes Patient Requires Transmission-Based Precautions: No Patient Has Alerts: No Electronic Signature(s) Signed: 09/23/2021 5:26:09 PM By: Lorrin Jackson Entered By: Lorrin Jackson on 09/23/2021 08:02:49 -------------------------------------------------------------------------------- Clinic Level of Care Assessment Details Patient Name: Date of Service: REA RHONDA, VANGIESON 09/23/2021 7:30 A M Medical Record Number: [id number] Patient Account Number: [id number] Date of Birth/Sex: Treating RN: 04/24/1976 (45 y.o. Marcheta Grammes Primary Care Christinia Lambeth: Retia Passe Other Clinician: Referring Evart Mcdonnell: Treating Kaison Mcparland/Extender: Glenford Peers, JESSICA Weeks in Treatment: 0 Clinic Level of Care Assessment Items TOOL 1 Quantity Score X- 1 0 Use when EandM and Procedure is performed on INITIAL visit ASSESSMENTS - Nursing Assessment / Reassessment X- 1 20 General Physical Exam (combine w/ comprehensive assessment (listed just below) when performed on new pt. evals) X- 1 25 Comprehensive Assessment (HX, ROS, Risk Assessments, Wounds Hx, etc.) ASSESSMENTS - Wound and Skin Assessment / Reassessment []  - 0 Dermatologic / Skin Assessment (not related to wound area) ASSESSMENTS - Ostomy and/or Continence Assessment and Care []  - 0 Incontinence Assessment and Management []  - 0 Ostomy Care Assessment and Management (repouching, etc.) PROCESS - Coordination of Care []  - 0 Simple Patient / Family Education for ongoing care X- 1 20 Complex (extensive) Patient / Family Education for ongoing care X- 1 10 Staff obtains Programmer, systems, Records, T Results / Process Orders est X- 1 10 Staff telephones HHA, Nursing Homes / Clarify orders / etc []  - 0 Routine Transfer to another Facility (non-emergent condition) []  - 0 Routine Hospital Admission (non-emergent condition) []  - 0 New Admissions / Biomedical engineer / Ordering NPWT Apligraf, etc. , []  - 0 Emergency Hospital Admission (emergent condition) PROCESS - Special Needs []  - 0 Pediatric / Minor Patient Management []  - 0 Isolation Patient Management []  - 0 Hearing / Language / Visual special needs []  - 0 Assessment of Community assistance (transportation, D/C planning, etc.) []  - 0 Additional assistance / Altered mentation []  - 0 Support Surface(s) Assessment (bed, cushion, seat, etc.) INTERVENTIONS - Miscellaneous []  - 0 External ear exam []  - 0 Patient Transfer (multiple staff / Civil Service fast streamer / Similar devices) []  - 0 Simple Staple /  Suture removal (25 or less) []  - 0 Complex Staple / Suture removal (26 or more) []  - 0 Hypo/Hyperglycemic Management (do not check if billed separately) []  - 0 Ankle / Brachial Index (ABI) - do not check if billed separately Has the patient been seen at  the hospital within the last three years: Yes Total Score: 85 Level Of Care: New/Established - Level 3 Electronic Signature(s) Signed: 09/23/2021 5:26:09 PM By: Lorrin Jackson Signed: 09/23/2021 5:26:09 PM By: Lorrin Jackson Entered By: Lorrin Jackson on 09/23/2021 09:27:37 -------------------------------------------------------------------------------- Encounter Discharge Information Details Patient Name: Date of Service: REA Cristal Deer. 09/23/2021 7:30 A M Medical Record Number: [id number] Patient Account Number: [id number] Date of Birth/Sex: Treating RN: 02/02/1976 (45 y.o. Marcheta Grammes Primary Care Destyne Goodreau: Retia Passe Other Clinician: Referring Amando Ishikawa: Treating Lucy Woolever/Extender: Glenford Peers, JESSICA Weeks in Treatment: 0 Encounter Discharge Information Items Post Procedure Vitals Discharge Condition: Stable Temperature (F): 98.5 Ambulatory Status: Wheelchair Pulse (bpm): 66 Discharge Destination: Home Respiratory Rate (breaths/min): 18 Transportation: Private Auto Blood Pressure (mmHg): 134/66 Accompanied By: Wife Schedule Follow-up Appointment: Yes Clinical Summary of Care: Provided on 09/23/2021 Form Type Recipient Paper Patient Patient Electronic Signature(s) Signed: 09/23/2021 5:26:09 PM By: Lorrin Jackson Entered By: Lorrin Jackson on 09/23/2021 09:29:34 -------------------------------------------------------------------------------- Lower Extremity Assessment Details Patient Name: Date of Service: REA Cristal Deer. 09/23/2021 7:30 A M Medical Record Number: [id number] Patient Account Number: [id number] Date of Birth/Sex: Treating RN: [birth year] (45 y.o. Marcheta Grammes Primary Care Milton Sagona: Retia Passe Other Clinician: Referring Christinia Lambeth: Treating Aloura Matsuoka/Extender: Glenford Peers, JESSICA Weeks in Treatment: 0 Electronic Signature(s) Signed: 09/23/2021 5:26:09 PM By: Lorrin Jackson Entered By: Lorrin Jackson on 09/23/2021 08:20:48 -------------------------------------------------------------------------------- Multi Wound Chart Details Patient Name: Date of Service: REA Cristal Deer. 09/23/2021 7:30 A M Medical Record Number: [id number] Patient Account Number: [id number] Date of Birth/Sex: Treating RN: May 03, 1976 (45 y.o. Marcheta Grammes Primary Care Abbegale Stehle: Silvestre Gunner, JESSICA Other Clinician: Referring Rossy Virag: Treating Mariangela Heldt/Extender: Glenford Peers, JESSICA Weeks in Treatment: 0 Vital Signs Height(in): 71 Pulse(bpm): 24 Weight(lbs): 180 Blood Pressure(mmHg): 134/66 Body Mass Index(BMI): 25 Temperature(F): 98.5 Respiratory Rate(breaths/min): 18 Photos: [N/A:N/A] Sacrum N/A N/A Wound Location: Pressure Injury N/A N/A Wounding Event: Pressure Ulcer N/A N/A Primary Etiology: Hypertension, Type II Diabetes, N/A N/A Comorbid History: Osteomyelitis, Neuropathy 07/22/2021 N/A N/A Date Acquired: 0 N/A N/A Weeks of Treatment: Open N/A N/A Wound Status: 6x3x2.5 N/A N/A Measurements L x W x D (cm) 14.137 N/A N/A A (cm) : rea 35.343 N/A N/A Volume (cm) : 0.00% N/A N/A % Reduction in A rea: 0.00% N/A N/A % Reduction in Volume: 1 Starting Position 1 (o'clock): 6 Ending Position 1 (o'clock): 2.2 Maximum Distance 1 (cm): Yes N/A N/A Undermining: Category/Stage III N/A N/A Classification: Medium N/A N/A Exudate A mount: Serosanguineous N/A N/A Exudate Type: red, brown N/A N/A Exudate Color: Well defined, not attached N/A N/A Wound Margin: Large (67-100%) N/A N/A Granulation A mount: Red, Pink N/A N/A Granulation Quality: Small (1-33%) N/A N/A Necrotic A mount: Fat Layer  (Subcutaneous Tissue): Yes N/A N/A Exposed Structures: Fascia: No Tendon: No Muscle: No Joint: No Bone: No Debridement - Excisional N/A N/A Debridement: Pre-procedure Verification/Time Out 09:14 N/A N/A Taken: Other N/A N/A Pain Control: Subcutaneous, Slough N/A N/A Tissue Debrided: Skin/Subcutaneous Tissue N/A N/A Level: 18 N/A N/A Debridement A (sq cm): rea Curette N/A N/A Instrument: Minimum N/A N/A Bleeding: Pressure N/A N/A Hemostasis A chieved: Procedure was tolerated well N/A N/A Debridement Treatment Response: 6x3x2.5 N/A N/A Post Debridement Measurements L x W x D (cm) 35.343 N/A N/A Post Debridement Volume: (cm) Category/Stage III N/A N/A Post Debridement Stage: Debridement N/A N/A Procedures Performed: Treatment Notes Wound #1 (Sacrum) Cleanser Soap and Water Discharge  Instruction: May shower and wash wound with dial antibacterial soap and water prior to dressing change. Wound Cleanser Discharge Instruction: Cleanse the wound with wound cleanser prior to applying a clean dressing using gauze sponges, not tissue or cotton balls. Peri-Wound Care Skin Prep Discharge Instruction: Use skin prep as directed Topical Dakins Solution Primary Dressing Secondary Dressing Woven Gauze Sponge, Non-Sterile 4x4 in Discharge Instruction: Apply Dakins moistened to wound bed, cover with dry gauze Zetuvit Plus Silicone Border Dressing 5x5 (in/in) Discharge Instruction: Apply silicone border over primary dressing as directed. Secured With Compression Wrap Compression Stockings Environmental education officer) Signed: 09/23/2021 4:46:43 PM By: Lorrin Jackson Entered By: Lorrin Jackson on 09/23/2021 16:46:42 -------------------------------------------------------------------------------- Multi-Disciplinary Care Plan Details Patient Name: Date of Service: REA Cristal Deer. 09/23/2021 7:30 A M Medical Record Number: [id number] Patient Account Number:  [id number] Date of Birth/Sex: Treating RN: Nov 12, 1975 (45 y.o. Marcheta Grammes Primary Care Djimon Lundstrom: Retia Passe Other Clinician: Referring Caitland Porchia: Treating Sapphire Tygart/Extender: Glenford Peers, JESSICA Weeks in Treatment: 0 Active Inactive Osteomyelitis Nursing Diagnoses: Infection: osteomyelitis Goals: Patient/caregiver will verbalize understanding of disease process and disease management Date Initiated: 09/23/2021 Target Resolution Date: 10/21/2021 Goal Status: Active Interventions: Assess for signs and symptoms of osteomyelitis resolution every visit Provide education on osteomyelitis Notes: Wound/Skin Impairment Nursing Diagnoses: Impaired tissue integrity Goals: Patient/caregiver will verbalize understanding of skin care regimen Date Initiated: 09/23/2021 Target Resolution Date: 10/21/2021 Goal Status: Active Ulcer/skin breakdown will have a volume reduction of 30% by week 4 Date Initiated: 09/23/2021 Target Resolution Date: 10/21/2021 Goal Status: Active Interventions: Assess patient/caregiver ability to obtain necessary supplies Assess patient/caregiver ability to perform ulcer/skin care regimen upon admission and as needed Assess ulceration(s) every visit Provide education on ulcer and skin care Treatment Activities: Topical wound management initiated : 09/23/2021 Notes: Electronic Signature(s) Signed: 09/23/2021 5:26:09 PM By: Lorrin Jackson Entered By: Lorrin Jackson on 09/23/2021 08:26:47 -------------------------------------------------------------------------------- Pain Assessment Details Patient Name: Date of Service: REA Cristal Deer. 09/23/2021 7:30 A M Medical Record Number: [id number] Patient Account Number: [id number] Date of Birth/Sex: Treating RN: Jan 29, 1976 (45 y.o. Marcheta Grammes Primary Care Gaylan Fauver: Retia Passe Other Clinician: Referring Elmer Merwin: Treating Kearston Putman/Extender: Glenford Peers,  JESSICA Weeks in Treatment: 0 Active Problems Location of Pain Severity and Description of Pain Patient Has Paino Yes Site Locations Pain Location: Pain in Ulcers With Dressing Change: Yes Duration of the Pain. Constant / Intermittento Intermittent Rate the pain. Current Pain Level: 6 Character of Pain Describe the Pain: Shooting, Throbbing Pain Management and Medication Current Pain Management: Medication: Yes Cold Application: No Rest: Yes Massage: No Activity: No T.E.N.S.: No Heat Application: No Leg drop or elevation: No Is the Current Pain Management Adequate: Adequate How does your wound impact your activities of daily livingo Sleep: Yes Bathing: No Appetite: No Relationship With Others: No Bladder Continence: No Emotions: No Bowel Continence: No Work: No Toileting: No Drive: No Dressing: No Hobbies: No Electronic Signature(s) Signed: 09/23/2021 5:26:09 PM By: Lorrin Jackson Entered By: Lorrin Jackson on 09/23/2021 08:20:05 -------------------------------------------------------------------------------- Patient/Caregiver Education Details Patient Name: Date of Service: REA Cristal Deer 11/30/2022andnbsp7:30 A M Medical Record Number: [id number] Patient Account Number: [id number] Date of Birth/Gender: Treating RN: 1976/05/27 (45 y.o. Marcheta Grammes Primary Care Physician: Silvestre Gunner, JESSICA Other Clinician: Referring Physician: Treating Physician/Extender: Glenford Peers, JESSICA Weeks in Treatment: 0 Education Assessment Education Provided To: Patient and Caregiver Education Topics Provided Infection: Methods: Explain/Verbal, Printed Responses: State content correctly  Pressure: Methods: Explain/Verbal, Printed Responses: State content correctly Wound/Skin Impairment: Methods: Explain/Verbal, Printed Responses: State content correctly Electronic Signature(s) Signed: 09/23/2021 5:26:09 PM By: Lorrin Jackson Entered By: Lorrin Jackson on 09/23/2021 08:27:26 -------------------------------------------------------------------------------- Wound Assessment Details Patient Name: Date of Service: REA Cristal Deer. 09/23/2021 7:30 A M Medical Record Number: [id number] Patient Account Number: [id number] Date of Birth/Sex: Treating RN: 1976-06-15 (45 y.o. M) Primary Care Wah Sabic: Retia Passe Other Clinician: Referring Raimundo Corbit: Treating Corney Knighton/Extender: Worthy Keeler EA SLEY, JESSICA Weeks in Treatment: 0 Wound Status Wound Number: 1 Primary Etiology: Pressure Ulcer Wound Location: Sacrum Wound Status: Open Wounding Event: Pressure Injury Comorbid Hypertension, Type II Diabetes, Osteomyelitis, Neuropathy History: Date Acquired: 07/22/2021 Weeks Of Treatment: 0 Clustered Wound: No Photos Wound Measurements Length: (cm) 6 Width: (cm) 3 Depth: (cm) 2.5 Area: (cm) 14.137 Volume: (cm) 35.343 % Reduction in Area: 0% % Reduction in Volume: 0% Tunneling: No Undermining: Yes Starting Position (o'clock): 1 Ending Position (o'clock): 6 Maximum Distance: (cm) 2.2 Wound Description Classification: Category/Stage III Wound Margin: Well defined, not attached Exudate Amount: Medium Exudate Type: Serosanguineous Exudate Color: red, brown Foul Odor After Cleansing: No Slough/Fibrino Yes Wound Bed Granulation Amount: Large (67-100%) Exposed Structure Granulation Quality: Red, Pink Fascia Exposed: No Necrotic Amount: Small (1-33%) Fat Layer (Subcutaneous Tissue) Exposed: Yes Necrotic Quality: Adherent Slough Tendon Exposed: No Muscle Exposed: No Joint Exposed: No Bone Exposed: No Treatment Notes Wound #1 (Sacrum) Cleanser Soap and Water Discharge Instruction: May shower and wash wound with dial antibacterial soap and water prior to dressing change. Wound Cleanser Discharge Instruction: Cleanse the wound with wound cleanser prior to applying a clean dressing using gauze sponges, not tissue or  cotton balls. Peri-Wound Care Skin Prep Discharge Instruction: Use skin prep as directed Topical Dakins Solution Primary Dressing Secondary Dressing Woven Gauze Sponge, Non-Sterile 4x4 in Discharge Instruction: Apply Dakins moistened to wound bed, cover with dry gauze Zetuvit Plus Silicone Border Dressing 5x5 (in/in) Discharge Instruction: Apply silicone border over primary dressing as directed. Secured With Compression Wrap Compression Stockings Add-Ons Electronic Signature(s) Signed: 09/23/2021 9:02:24 AM By: Worthy Keeler PA-C Entered By: Worthy Keeler on 09/23/2021 09:02:24 -------------------------------------------------------------------------------- Vitals Details Patient Name: Date of Service: [name], Hoyt Koch. 09/23/2021 7:30 A M Medical Record Number: [id number] Patient Account Number: [id number] Date of Birth/Sex: Treating RN: Jun 08, 1976 (45 y.o. Marcheta Grammes Primary Care Cari Burgo: Silvestre Gunner, JESSICA Other Clinician: Referring Rekha Hobbins: Treating Braedyn Riggle/Extender: Glenford Peers, JESSICA Weeks in Treatment: 0 Vital Signs Time Taken: 08:04 Temperature (F): 98.5 Height (in): 71 Pulse (bpm): 66 Source: Stated Respiratory Rate (breaths/min): 18 Weight (lbs): 180 Blood Pressure (mmHg): 134/66 Source: Stated Reference Range: 80 - 120 mg / dl Body Mass Index (BMI): 25.1 Electronic Signature(s) Signed: 09/23/2021 5:26:09 PM By: Lorrin Jackson Entered By: Lorrin Jackson on 09/23/2021 08:05:16

## 2021-09-23 NOTE — Progress Notes (Signed)
LEVENT, KORNEGAY ([id number]) Visit Report for 09/23/2021 Abuse/Suicide Risk Screen Details Patient Name: Date of Service: Jose [name], Jose [name] 09/23/2021 7:30 A M Medical Record Number: [id number] Patient Account Number: [id number] Date of Birth/Sex: Treating RN: Mar 19, 1976 (45 y.o. Marcheta Grammes Primary Care Azai Gaffin: Retia Passe Other Clinician: Referring Tedd Cottrill: Treating Degan Hanser/Extender: Glenford Peers, JESSICA Weeks in Treatment: 0 Abuse/Suicide Risk Screen Items Answer ABUSE RISK SCREEN: Has anyone close to you tried to hurt or harm you recentlyo No Do you feel uncomfortable with anyone in your familyo No Has anyone forced you do things that you didnt want to doo No Electronic Signature(s) Signed: 09/23/2021 5:26:09 PM By: Lorrin Jackson Entered By: Lorrin Jackson on 09/23/2021 08:16:25 -------------------------------------------------------------------------------- Activities of Daily Living Details Patient Name: Date of Service: Jose SAFIR, MICHALEC 09/23/2021 7:30 A M Medical Record Number: [id number] Patient Account Number: [id number] Date of Birth/Sex: Treating RN: 08-12-1976 (45 y.o. Marcheta Grammes Primary Care Joceline Hinchcliff: Retia Passe Other Clinician: Referring Ellar Hakala: Treating Rhilyn Battle/Extender: Glenford Peers, JESSICA Weeks in Treatment: 0 Activities of Daily Living Items Answer Activities of Daily Living (Please select one for each item) Drive Automobile Need Assistance T Medications ake Completely Able Use T elephone Completely Able Care for Appearance Completely Able Use T oilet Completely Able Bath / Shower Completely Able Dress Self Completely Able Feed Self Completely Able Walk Need Assistance Get In / Out Bed Need Assistance Housework Need Assistance Prepare Meals Need Assistance Handle Money Completely Able Shop for Self Need Assistance Electronic Signature(s) Signed: 09/23/2021 5:26:09 PM By: Lorrin Jackson Entered By: Lorrin Jackson on 09/23/2021 08:17:41 -------------------------------------------------------------------------------- Education Screening Details Patient Name: Date of Service: Jose Cristal Deer. 09/23/2021 7:30 A M Medical Record Number: [id number] Patient Account Number: [id number] Date of Birth/Sex: Treating RN: 1976-05-21 (45 y.o. Marcheta Grammes Primary Care Jaana Brodt: Retia Passe Other Clinician: Referring Airyanna Dipalma: Treating Tandra Rosado/Extender: Glenford Peers, JESSICA Weeks in Treatment: 0 Primary Learner Assessed: Patient Learning Preferences/Education Level/Primary Language Learning Preference: Explanation, Demonstration, Printed Material Highest Education Level: College or Above Preferred Language: English Cognitive Barrier Language Barrier: No Translator Needed: No Memory Deficit: No Emotional Barrier: No Cultural/Religious Beliefs Affecting Medical Care: No Physical Barrier Impaired Vision: Yes Glasses Impaired Hearing: No Decreased Hand dexterity: No Knowledge/Comprehension Knowledge Level: High Comprehension Level: High Ability to understand written instructions: High Ability to understand verbal instructions: High Motivation Anxiety Level: Calm Cooperation: Cooperative Education Importance: Acknowledges Need Interest in Health Problems: Asks Questions Perception: Coherent Willingness to Engage in Self-Management High Activities: Readiness to Engage in Self-Management High Activities: Electronic Signature(s) Signed: 09/23/2021 5:26:09 PM By: Lorrin Jackson Entered By: Lorrin Jackson on 09/23/2021 08:18:10 -------------------------------------------------------------------------------- Fall Risk Assessment Details Patient Name: Date of Service: Jose V IS, Hoyt Koch. 09/23/2021 7:30 A M Medical Record Number: [id number] Patient Account Number: [id number] Date of Birth/Sex: Treating RN: Jun 05, 1976 (45 y.o. Marcheta Grammes Primary Care Velna Hedgecock: Silvestre Gunner, JESSICA Other Clinician: Referring Maybelline Kolarik: Treating Rorie Delmore/Extender: Glenford Peers, JESSICA Weeks in Treatment: 0 Fall Risk Assessment Items Have you had 2 or more falls in the last 12 monthso 0 No Have you had any fall that resulted in injury in the last 12 monthso 0 No FALLS RISK SCREEN History of falling - immediate or within 3 months 0 No Secondary diagnosis (Do you have 2 or more medical diagnoseso) 15 Yes Ambulatory aid None/bed rest/wheelchair/nurse 0 No Crutches/cane/walker 15 Yes Furniture 0 No Intravenous  therapy Access/Saline/Heparin Lock 0 No Gait/Transferring Normal/ bed rest/ wheelchair 0 No Weak (short steps with or without shuffle, stooped but able to lift head while walking, may seek 10 Yes support from furniture) Impaired (short steps with shuffle, may have difficulty arising from chair, head down, impaired 0 No balance) Mental Status Oriented to own ability 0 Yes Electronic Signature(s) Signed: 09/23/2021 5:26:09 PM By: Lorrin Jackson Entered By: Lorrin Jackson on 09/23/2021 08:18:31 -------------------------------------------------------------------------------- Foot Assessment Details Patient Name: Date of Service: Jose Cristal Deer. 09/23/2021 7:30 A M Medical Record Number: [id number] Patient Account Number: [id number] Date of Birth/Sex: Treating RN: [birth year] (45 y.o. Marcheta Grammes Primary Care Lielle Vandervort: Silvestre Gunner, JESSICA Other Clinician: Referring Hubert Derstine: Treating Salvatore Shear/Extender: Glenford Peers, JESSICA Weeks in Treatment: 0 Foot Assessment Items Site Locations + = Sensation present, - = Sensation absent, C = Callus, U = Ulcer R = Redness, W = Warmth, M = Maceration, PU = Pre-ulcerative lesion F = Fissure, S = Swelling, D = Dryness Assessment Right: Left: Other Deformity: No No Prior Foot Ulcer: No No Prior Amputation: No No Charcot Joint: No No Ambulatory Status:  Ambulatory With Help Assistance Device: Walker Gait: Steady Notes N/A Sacral Wound Electronic Signature(s) Signed: 09/23/2021 5:26:09 PM By: Lorrin Jackson Entered By: Lorrin Jackson on 09/23/2021 08:20:40 -------------------------------------------------------------------------------- Nutrition Risk Screening Details Patient Name: Date of Service: Jose DOMINGO, FUSON 09/23/2021 7:30 A M Medical Record Number: [id number] Patient Account Number: [id number] Date of Birth/Sex: Treating RN: 12-02-1975 (45 y.o. Marcheta Grammes Primary Care Tashiana Lamarca: Silvestre Gunner, JESSICA Other Clinician: Referring Denice Cardon: Treating Sira Adsit/Extender: Glenford Peers, JESSICA Weeks in Treatment: 0 Height (in): 71 Weight (lbs): 180 Body Mass Index (BMI): 25.1 Nutrition Risk Screening Items Score Screening NUTRITION RISK SCREEN: I have an illness or condition that made me change the kind and/or amount of food I eat 0 No I eat fewer than two meals per day 0 No I eat few fruits and vegetables, or milk products 0 No I have three or more drinks of beer, liquor or wine almost every day 0 No I have tooth or mouth problems that make it hard for me to eat 0 No I don't always have enough money to buy the food I need 0 No I eat alone most of the time 0 No I take three or more different prescribed or over-the-counter drugs a day 1 Yes Without wanting to, I have lost or gained 10 pounds in the last six months 2 Yes I am not always physically able to shop, cook and/or feed myself 0 No Nutrition Protocols Good Risk Protocol Moderate Risk Protocol 0 Provide education on nutrition High Risk Proctocol Risk Level: Moderate Risk Score: 3 Electronic Signature(s) Signed: 09/23/2021 5:26:09 PM By: Lorrin Jackson Entered By: Lorrin Jackson on 09/23/2021 08:19:12

## 2021-09-23 NOTE — Progress Notes (Signed)
TOU, HAYNER ([id number]) Visit Report for 09/23/2021 Chief Complaint Document Details Patient Name: Date of Service: Jose [name], Jose [name] 09/23/2021 7:30 A M Medical Record Number: [id number] Patient Account Number: [id number] Date of Birth/Sex: Treating RN: 05-04-1976 (45 y.o. M) Primary Care Provider: Silvestre Gunner, JESSICA Other Clinician: Referring Provider: Treating Provider/Extender: Glenford Peers, JESSICA Weeks in Treatment: 0 Information Obtained from: Patient Chief Complaint Sacral abscess with osteomyelitis Electronic Signature(s) Signed: 09/23/2021 9:06:17 AM By: Worthy Keeler PA-C Entered By: Worthy Keeler on 09/23/2021 09:06:17 -------------------------------------------------------------------------------- Debridement Details Patient Name: Date of Service: Jose [name], Jose Souter Koch. 09/23/2021 7:30 A M Medical Record Number: [id number] Patient Account Number: [id number] Date of Birth/Sex: Treating RN: 08/05/1976 (45 y.o. Marcheta Grammes Primary Care Provider: Retia Passe Other Clinician: Referring Provider: Treating Provider/Extender: Glenford Peers, JESSICA Weeks in Treatment: 0 Debridement Performed for Assessment: Wound #1 Sacrum Performed By: Physician Worthy Keeler, PA Debridement Type: Debridement Level of Consciousness (Pre-procedure): Awake and Alert Pre-procedure Verification/Time Out Yes - 09:14 Taken: Start Time: 09:15 Pain Control: Other : Benzocaine T Area Debrided (L x W): otal 6 (cm) x 3 (cm) = 18 (cm) Tissue and other material debrided: Non-Viable, Slough, Subcutaneous, Slough Level: Skin/Subcutaneous Tissue Debridement Description: Excisional Instrument: Curette Bleeding: Minimum Hemostasis Achieved: Pressure End Time: 09:19 Response to Treatment: Procedure was tolerated well Level of Consciousness (Post- Awake and Alert procedure): Post Debridement Measurements of Total Wound Length: (cm) 6 Stage: Category/Stage  III Width: (cm) 3 Depth: (cm) 2.5 Volume: (cm) 35.343 Character of Wound/Ulcer Post Debridement: Stable Post Procedure Diagnosis Same as Pre-procedure Electronic Signature(s) Signed: 09/23/2021 5:26:09 PM By: Lorrin Jackson Signed: 09/23/2021 6:11:30 PM By: Worthy Keeler PA-C Entered By: Lorrin Jackson on 09/23/2021 09:19:09 -------------------------------------------------------------------------------- HPI Details Patient Name: Date of Service: Jose Jearld Shines K. 09/23/2021 7:30 A M Medical Record Number: [id number] Patient Account Number: [id number] Date of Birth/Sex: Treating RN: 1976/05/14 (45 y.o. M) Primary Care Provider: Retia Passe Other Clinician: Referring Provider: Treating Provider/Extender: Glenford Peers, JESSICA Weeks in Treatment: 0 History of Present Illness HPI Description: 09/23/2021 upon evaluation today patient presents for initial inspection here in the clinic concerning a wound that he has over the sacral region. He unfortunately has been experiencing issues with a pilonidal cyst that opened in September 2022. It was also noted in the end that he also had osteomyelitis in the sacral region. That [name] quite unfortunate he still on IV antibiotics for that his wife helps to manage this at home. This [name] a stage III pressure ulcer. Subsequently the patient also has diabetes mellitus type 2, generalized weakness, and again the history of pilonidal cyst in this area with abscess. He does have chronic kidney disease stage III. His most recent hemoglobin A1c was 7.1 at last check. He does not have home health his wife [name] the one performing all the dressing changes currently they have been doing Santyl with Dakin's moistened gauze behind it 2 times a day. Electronic Signature(s) Signed: 09/23/2021 6:05:51 PM By: Worthy Keeler PA-C Entered By: Worthy Keeler on 09/23/2021  18:05:51 -------------------------------------------------------------------------------- Physical Exam Details Patient Name: Date of Service: Jose [name], Jose [name]. 09/23/2021 7:30 A M Medical Record Number: [id number] Patient Account Number: [id number] Date of Birth/Sex: Treating RN: 1975/11/27 (45 y.o. M) Primary Care Provider: Silvestre Gunner, JESSICA Other Clinician: Referring Provider: Treating Provider/Extender: Worthy Keeler EA SLEY, JESSICA Weeks in Treatment: 0 Constitutional sitting or  standing blood pressure [name] within target range for patient.. pulse regular and within target range for patient.Marland Kitchen respirations regular, non-labored and within target range for patient.Marland Kitchen temperature within target range for patient.. Eyes conjunctiva clear no eyelid edema noted. pupils equal round and reactive to light and accommodation. Ears, Nose, Mouth, and Throat no gross abnormality of ear auricles or external auditory canals. normal hearing noted during conversation. mucus membranes moist. Respiratory normal breathing without difficulty. Musculoskeletal Patient unable to walk without assistance. no significant deformity or arthritic changes, no loss or range of motion, no clubbing. Psychiatric this patient [name] able to make decisions and demonstrates good insight into disease process. Alert and Oriented x 3. pleasant and cooperative. Notes Upon inspection patient's wound bed actually showed signs of fairly good granulation and epithelization at this point in some areas he did have some slough buildup and others. Subsequently I did actually perform sharp debridement to help clear away some of the necrotic debris after discussing this with the patient today. We were able to remove quite a bit of this he does have a small area of tunneling really at the 6:00 location which [name] good and need to be addressed. We need to make sure to talk some of the Dakin's moistened gauze down into this area. Subsequently  also think continuation of the Santyl [name] okay as well at this point. Especially since they have quite a bit of this at home. Electronic Signature(s) Signed: 09/23/2021 6:06:39 PM By: Worthy Keeler PA-C Entered By: Worthy Keeler on 09/23/2021 18:06:39 -------------------------------------------------------------------------------- Physician Orders Details Patient Name: Date of Service: Jose [name], Jose K. 09/23/2021 7:30 A M Medical Record Number: [id number] Patient Account Number: [id number] Date of Birth/Sex: Treating RN: 12/23/1975 (45 y.o. Marcheta Grammes Primary Care Provider: Silvestre Gunner, JESSICA Other Clinician: Referring Provider: Treating Provider/Extender: Glenford Peers, JESSICA Weeks in Treatment: 0 Verbal / Phone Orders: No Diagnosis Coding ICD-10 Coding Code Description L89.153 Pressure ulcer of sacral region, stage 3 M46.28 Osteomyelitis of vertebra, sacral and sacrococcygeal region E11.622 Type 2 diabetes mellitus with other skin ulcer M62.81 Muscle weakness (generalized) L05.01 Pilonidal cyst with abscess N18.30 Chronic kidney disease, stage 3 unspecified Follow-up Appointments ppointment in 1 week. - with Margarita Grizzle Return A Other: - Halo = Supplies Bathing/ Shower/ Hygiene May shower and wash wound with soap and water. - when changing dressing Off-Loading Roho cushion for wheelchair Turn and reposition every 2 hours Additional Orders / Instructions Follow Nutritious Diet - High Protein Diet Non Wound Condition Protect area with: - Left Great T Apply gauze and foam donut to protect area. oe: Wound Treatment Wound #1 - Sacrum Cleanser: Soap and Water 2 x Per Day/30 Days Discharge Instructions: May shower and wash wound with dial antibacterial soap and water prior to dressing change. Cleanser: Wound Cleanser 2 x Per Day/30 Days Discharge Instructions: Cleanse the wound with wound cleanser prior to applying a clean dressing using gauze sponges, not  tissue or cotton balls. Peri-Wound Care: Skin Prep 2 x Per Day/30 Days Discharge Instructions: Use skin prep as directed Topical: Dakins Solution 2 x Per Day/30 Days Secondary Dressing: Woven Gauze Sponge, Non-Sterile 4x4 in 2 x Per Day/30 Days Discharge Instructions: Apply Dakins moistened to wound bed, cover with dry gauze Secondary Dressing: Zetuvit Plus Silicone Border Dressing 5x5 (in/in) 2 x Per Day/30 Days Discharge Instructions: Apply silicone border over primary dressing as directed. Electronic Signature(s) Signed: 09/23/2021 5:26:09 PM By: Lorrin Jackson Signed: 09/23/2021 6:11:30 PM By: Joaquim Lai  III, Cambri Plourde PA-C Entered By: Lorrin Jackson on 09/23/2021 09:25:55 -------------------------------------------------------------------------------- Problem List Details Patient Name: Date of Service: Jose [name], Jose [name] 09/23/2021 7:30 A M Medical Record Number: [id number] Patient Account Number: [id number] Date of Birth/Sex: Treating RN: Mar 24, 1976 (45 y.o. M) Primary Care Provider: Silvestre Gunner, JESSICA Other Clinician: Referring Provider: Treating Provider/Extender: Glenford Peers, JESSICA Weeks in Treatment: 0 Active Problems ICD-10 Encounter Code Description Active Date MDM Diagnosis L89.153 Pressure ulcer of sacral region, stage 3 09/23/2021 No Yes M46.28 Osteomyelitis of vertebra, sacral and sacrococcygeal region 09/23/2021 No Yes E11.622 Type 2 diabetes mellitus with other skin ulcer 09/23/2021 No Yes M62.81 Muscle weakness (generalized) 09/23/2021 No Yes L05.01 Pilonidal cyst with abscess 09/23/2021 No Yes N18.30 Chronic kidney disease, stage 3 unspecified 09/23/2021 No Yes Inactive Problems Resolved Problems Electronic Signature(s) Signed: 09/23/2021 9:06:42 AM By: Worthy Keeler PA-C Previous Signature: 09/23/2021 9:05:47 AM Version By: Worthy Keeler PA-C Entered By: Worthy Keeler on 09/23/2021  09:06:42 -------------------------------------------------------------------------------- Progress Note Details Patient Name: Date of Service: Jose [name], Jose Schellenberg Koch. 09/23/2021 7:30 A M Medical Record Number: [id number] Patient Account Number: [id number] Date of Birth/Sex: Treating RN: 1975-10-30 (45 y.o. M) Primary Care Provider: Other Clinician: Retia Passe Referring Provider: Treating Provider/Extender: Glenford Peers, JESSICA Weeks in Treatment: 0 Subjective Chief Complaint Information obtained from Patient Sacral abscess with osteomyelitis History of Present Illness (HPI) 09/23/2021 upon evaluation today patient presents for initial inspection here in the clinic concerning a wound that he has over the sacral region. He unfortunately has been experiencing issues with a pilonidal cyst that opened in September 2022. It was also noted in the end that he also had osteomyelitis in the sacral region. That [name] quite unfortunate he still on IV antibiotics for that his wife helps to manage this at home. This [name] a stage III pressure ulcer. Subsequently the patient also has diabetes mellitus type 2, generalized weakness, and again the history of pilonidal cyst in this area with abscess. He does have chronic kidney disease stage III. His most recent hemoglobin A1c was 7.1 at last check. He does not have home health his wife [name] the one performing all the dressing changes currently they have been doing Santyl with Dakin's moistened gauze behind it 2 times a day. Patient History Information obtained from Patient. Allergies cranberry (Severity: Mild, Reaction: itching), melatonin (Reaction: spasms, nightmares) Family History Diabetes - Father,Mother,Paternal Grandparents,Maternal Grandparents, Heart Disease - Siblings, Kidney Disease - Mother, No family history of Cancer, Hereditary Spherocytosis, Hypertension, Lung Disease, Seizures, Stroke, Thyroid Problems, Tuberculosis. Social  History Former smoker - Quit one year, Marital Status - Married, Alcohol Use - Never, Drug Use - No History, Caffeine Use - Daily. Medical History Cardiovascular Patient has history of Hypertension Endocrine Patient has history of Type II Diabetes Musculoskeletal Patient has history of Osteomyelitis - Thoracic, Sacrum Neurologic Patient has history of Neuropathy Patient [name] treated with Insulin. Blood sugar [name] tested. Medical A Surgical History Notes nd Eyes Retinopathy, Macular Degeneration Genitourinary Stage 3 Kidney Disease Review of Systems (ROS) Eyes Complains or has symptoms of Glasses / Contacts. Ear/Nose/Mouth/Throat Denies complaints or symptoms of Chronic sinus problems or rhinitis. Respiratory Denies complaints or symptoms of Chronic or frequent coughs, Shortness of Breath. Gastrointestinal Denies complaints or symptoms of Frequent diarrhea, Nausea, Vomiting. Integumentary (Skin) Complains or has symptoms of Wounds - sacral. Psychiatric Denies complaints or symptoms of Claustrophobia, Suicidal. Objective Constitutional sitting or standing blood pressure [name] within target range  for patient.. pulse regular and within target range for patient.Marland Kitchen respirations regular, non-labored and within target range for patient.Marland Kitchen temperature within target range for patient.. Vitals Time Taken: 8:04 AM, Height: 71 in, Source: Stated, Weight: 180 lbs, Source: Stated, BMI: 25.1, Temperature: 98.5 F, Pulse: 66 bpm, Respiratory Rate: 18 breaths/min, Blood Pressure: 134/66 mmHg. Eyes conjunctiva clear no eyelid edema noted. pupils equal round and reactive to light and accommodation. Ears, Nose, Mouth, and Throat no gross abnormality of ear auricles or external auditory canals. normal hearing noted during conversation. mucus membranes moist. Respiratory normal breathing without difficulty. Musculoskeletal Patient unable to walk without assistance. no significant deformity or arthritic  changes, no loss or range of motion, no clubbing. Psychiatric this patient [name] able to make decisions and demonstrates good insight into disease process. Alert and Oriented x 3. pleasant and cooperative. General Notes: Upon inspection patient's wound bed actually showed signs of fairly good granulation and epithelization at this point in some areas he did have some slough buildup and others. Subsequently I did actually perform sharp debridement to help clear away some of the necrotic debris after discussing this with the patient today. We were able to remove quite a bit of this he does have a small area of tunneling really at the 6:00 location which [name] good and need to be addressed. We need to make sure to talk some of the Dakin's moistened gauze down into this area. Subsequently also think continuation of the Santyl [name] okay as well at this point. Especially since they have quite a bit of this at home. Integumentary (Hair, Skin) Wound #1 status [name] Open. Original cause of wound was Pressure Injury. The date acquired was: 07/22/2021. The wound [name] located on the Sacrum. The wound measures 6cm length x 3cm width x 2.5cm depth; 14.137cm^2 area and 35.343cm^3 volume. There [name] Fat Layer (Subcutaneous Tissue) exposed. There [name] no tunneling noted, however, there [name] undermining starting at 1:00 and ending at 6:00 with a maximum distance of 2.2cm. There [name] a medium amount of serosanguineous drainage noted. The wound margin [name] well defined and not attached to the wound base. There [name] large (67-100%) red, pink granulation within the wound bed. There [name] a small (1-33%) amount of necrotic tissue within the wound bed including Adherent Slough. Assessment Active Problems ICD-10 Pressure ulcer of sacral region, stage 3 Osteomyelitis of vertebra, sacral and sacrococcygeal region Type 2 diabetes mellitus with other skin ulcer Muscle weakness (generalized) Pilonidal cyst with abscess Chronic kidney disease, stage 3  unspecified Procedures Wound #1 Pre-procedure diagnosis of Wound #1 [name] a Pressure Ulcer located on the Sacrum . There was a Excisional Skin/Subcutaneous Tissue Debridement with a total area of 18 sq cm performed by Worthy Keeler, PA. With the following instrument(s): Curette to remove Non-Viable tissue/material. Material removed includes Subcutaneous Tissue and Slough and after achieving pain control using Other (Benzocaine). No specimens were taken. A time out was conducted at 09:14, prior to the start of the procedure. A Minimum amount of bleeding was controlled with Pressure. The procedure was tolerated well. Post Debridement Measurements: 6cm length x 3cm width x 2.5cm depth; 35.343cm^3 volume. Post debridement Stage noted as Category/Stage III. Character of Wound/Ulcer Post Debridement [name] stable. Post procedure Diagnosis Wound #1: Same as Pre-Procedure Plan Follow-up Appointments: Return Appointment in 1 week. - with Margarita Grizzle Other: - Halo = Supplies Bathing/ Shower/ Hygiene: May shower and wash wound with soap and water. - when changing dressing Off-Loading: Roho cushion for wheelchair Turn and  reposition every 2 hours Additional Orders / Instructions: Follow Nutritious Diet - High Protein Diet Non Wound Condition: Protect area with: - Left Great T Apply gauze and foam donut to protect area. oe: WOUND #1: - Sacrum Wound Laterality: Cleanser: Soap and Water 2 x Per Day/30 Days Discharge Instructions: May shower and wash wound with dial antibacterial soap and water prior to dressing change. Cleanser: Wound Cleanser 2 x Per Day/30 Days Discharge Instructions: Cleanse the wound with wound cleanser prior to applying a clean dressing using gauze sponges, not tissue or cotton balls. Peri-Wound Care: Skin Prep 2 x Per Day/30 Days Discharge Instructions: Use skin prep as directed Topical: Dakins Solution 2 x Per Day/30 Days Secondary Dressing: Woven Gauze Sponge, Non-Sterile 4x4 in 2 x Per  Day/30 Days Discharge Instructions: Apply Dakins moistened to wound bed, cover with dry gauze Secondary Dressing: Zetuvit Plus Silicone Border Dressing 5x5 (in/in) 2 x Per Day/30 Days Discharge Instructions: Apply silicone border over primary dressing as directed. 1. Would recommend currently that we continue with Santyl followed by Dakin's moistened gauze dressings 2 times a day. Do I will use the Santyl however at night leaving it in while he sleeps and then during the day only use the Dakin's not the Santyl. 2. I am also can recommend that the patient be considered for a wound VAC. I do believe that we could do this and again I think it could be beneficial for her and I does want to try to get the wound a little bit cleaner before we initiate therapy in that regard I think this [name] best chance of getting this to heal effectively and quickly. 3. Also can recommend that we have the patient continue with the border foam dressing to cover I think that [name] an okay way to go here. ABD pads taped around the edges to secure would also be appropriate. We will see patient back for reevaluation in 1 week here in the clinic. If anything worsens or changes patient will contact our office for additional recommendations. Electronic Signature(s) Signed: 09/23/2021 6:07:35 PM By: Worthy Keeler PA-C Entered By: Worthy Keeler on 09/23/2021 18:07:35 -------------------------------------------------------------------------------- HxROS Details Patient Name: Date of Service: Jose [name], [place] K. 09/23/2021 7:30 A M Medical Record Number: [id number] Patient Account Number: [id number] Date of Birth/Sex: Treating RN: 25-Apr-1976 (45 y.o. Marcheta Grammes Primary Care Provider: Retia Passe Other Clinician: Referring Provider: Treating Provider/Extender: Glenford Peers, JESSICA Weeks in Treatment: 0 Information Obtained From Patient Eyes Complaints and Symptoms: Positive for: Glasses /  Contacts Medical History: Past Medical History Notes: Retinopathy, Macular Degeneration Ear/Nose/Mouth/Throat Complaints and Symptoms: Negative for: Chronic sinus problems or rhinitis Respiratory Complaints and Symptoms: Negative for: Chronic or frequent coughs; Shortness of Breath Gastrointestinal Complaints and Symptoms: Negative for: Frequent diarrhea; Nausea; Vomiting Integumentary (Skin) Complaints and Symptoms: Positive for: Wounds - sacral Psychiatric Complaints and Symptoms: Negative for: Claustrophobia; Suicidal Hematologic/Lymphatic Cardiovascular Medical History: Positive for: Hypertension Endocrine Medical History: Positive for: Type II Diabetes Time with diabetes: 30 years Treated with: Insulin Blood sugar tested every day: Yes Tested : Genitourinary Medical History: Past Medical History Notes: Stage 3 Kidney Disease Immunological Musculoskeletal Medical History: Positive for: Osteomyelitis - Thoracic, Sacrum Neurologic Medical History: Positive for: Neuropathy Oncologic Immunizations Pneumococcal Vaccine: Received Pneumococcal Vaccination: No Implantable Devices Yes Family and Social History Cancer: No; Diabetes: Yes - Father,Mother,Paternal Grandparents,Maternal Grandparents; Heart Disease: Yes - Siblings; Hereditary Spherocytosis: No; Hypertension: No; Kidney Disease: Yes - Mother; Lung Disease:  No; Seizures: No; Stroke: No; Thyroid Problems: No; Tuberculosis: No; Former smoker - Quit one year; Marital Status - Married; Alcohol Use: Never; Drug Use: No History; Caffeine Use: Daily; Financial Concerns: No; Food, Clothing or Shelter Needs: No; Support System Lacking: No; Transportation Concerns: No Electronic Signature(s) Signed: 09/23/2021 5:26:09 PM By: Lorrin Jackson Signed: 09/23/2021 6:11:30 PM By: Worthy Keeler PA-C Entered By: Lorrin Jackson on 09/23/2021  08:16:15 -------------------------------------------------------------------------------- SuperBill Details Patient Name: Date of Service: Jose [name] 09/23/2021 Medical Record Number: [id number] Patient Account Number: [id number] Date of Birth/Sex: Treating RN: [birth year] (45 y.o. Marcheta Grammes Primary Care Provider: Silvestre Gunner, JESSICA Other Clinician: Referring Provider: Treating Provider/Extender: Glenford Peers, JESSICA Weeks in Treatment: 0 Diagnosis Coding ICD-10 Codes Code Description [phone] Pressure ulcer of sacral region, stage 3 M46.28 Osteomyelitis of vertebra, sacral and sacrococcygeal region E11.622 Type 2 diabetes mellitus with other skin ulcer M62.81 Muscle weakness (generalized) L05.01 Pilonidal cyst with abscess N18.30 Chronic kidney disease, stage 3 unspecified Facility Procedures CPT4 Code: [id number] Description: [place] VISIT-LEV 3 EST PT Modifier: 25 Quantity: 1 CPT4 Code: [id number] Description: [place] - DEB SUBQ TISSUE 20 SQ CM/< ICD-10 Diagnosis Description L89.153 Pressure ulcer of sacral region, stage 3 Modifier: Quantity: 1 Physician Procedures : CPT4 Code Description Modifier [id number] - WC PHYS LEVEL 4 - NEW PT 25 ICD-10 Diagnosis Description L89.153 Pressure ulcer of sacral region, stage 3 M46.28 Osteomyelitis of vertebra, sacral and sacrococcygeal region E11.622 Type 2 diabetes mellitus  with other skin ulcer M62.81 Muscle weakness (generalized) Quantity: 1 : [id number] - WC PHYS SUBQ TISS 20 SQ CM ICD-10 Diagnosis Description L89.153 Pressure ulcer of sacral region, stage 3 Quantity: 1 Electronic Signature(s) Signed: 09/23/2021 6:08:07 PM By: Worthy Keeler PA-C Previous Signature: 09/23/2021 5:26:09 PM Version By: Lorrin Jackson Entered By: Worthy Keeler on 09/23/2021 18:08:07

## 2021-09-28 ENCOUNTER — Other Ambulatory Visit: Payer: Self-pay | Admitting: Physical Medicine and Rehabilitation

## 2021-09-29 ENCOUNTER — Ambulatory Visit (INDEPENDENT_AMBULATORY_CARE_PROVIDER_SITE_OTHER): Payer: 59 | Admitting: Infectious Diseases

## 2021-09-29 ENCOUNTER — Telehealth: Payer: Self-pay

## 2021-09-29 ENCOUNTER — Other Ambulatory Visit: Payer: Self-pay

## 2021-09-29 DIAGNOSIS — M4628 Osteomyelitis of vertebra, sacral and sacrococcygeal region: Secondary | ICD-10-CM

## 2021-09-29 DIAGNOSIS — Z5181 Encounter for therapeutic drug level monitoring: Secondary | ICD-10-CM | POA: Diagnosis not present

## 2021-09-29 DIAGNOSIS — M4624 Osteomyelitis of vertebra, thoracic region: Secondary | ICD-10-CM | POA: Diagnosis not present

## 2021-09-29 NOTE — Progress Notes (Addendum)
Virtual Visit via Telephone Note  I connected withNAME@ on 09/29/21 at 10:00 AM EST by a telephone enabled telemedicine application and verified that I am speaking with the correct person using two identifiers.  Location: Patient:Home  Provider: [facility]   I discussed the limitations of evaluation and management by telemedicine and the availability of in person appointments. The patient expressed understanding and agreed to proceed.  [facility]  Patient Active Problem List   Diagnosis Date Noted   GIB (gastrointestinal bleeding) 09/21/2021   Duodenal ulcer 09/21/2021   Esophagitis 09/21/2021   Decubitus ulcer of sacral region, unstageable ([place]) 09/21/2021   Osteomyelitis of sacrum ([place]) 09/21/2021   Hyponatremia    AKI (acute kidney injury) ([place])    Acute blood loss anemia    Labile blood glucose    Thoracic spondylosis with myelopathy 09/02/2021   Debility 08/30/2021   Osteomyelitis of thoracic region ([place]) 08/06/2021   Essential hypertension 08/06/2021   CKD (chronic kidney disease) stage 3, GFR 30-59 ml/min (HCC) 08/06/2021   Pleural effusion, bilateral 08/06/2021   Diabetic ulcer of left great toe ([place])    MSSA bacteremia 07/23/2021   Constipation 07/23/2021   Lytic bone lesions on xray 07/22/2021   Anemia 05/19/2021   Nausea and vomiting 05/19/2021   Elevated blood pressure reading 05/19/2021   Bilateral carpal tunnel syndrome 01/23/2020   Numbness 12/12/2019   Microalbuminuria due to type 2 diabetes mellitus ([place]) 07/20/2019   GAD (generalized anxiety disorder) 06/18/2019   Adult ADHD 06/18/2019   Trigger middle finger of left hand 06/11/2019   Diabetic retinopathy ([place]) 07/13/2018   Type 2 diabetes mellitus with diabetic polyneuropathy, without long-term current use of insulin ([place]) 05/17/2018   Tobacco use disorder 05/17/2018   Hyperlipidemia 05/17/2018    Patient's Medications  New Prescriptions   No medications on file  Previous  Medications   ACETAMINOPHEN (TYLENOL) 325 MG TABLET    Take 1-2 tablets (325-650 mg total) by mouth every 4 (four) hours as needed for mild pain.   ALBUTEROL (VENTOLIN HFA) 108 (90 BASE) MCG/ACT INHALER    Inhale 1 puff into the lungs every 4 (four) hours as needed for wheezing or shortness of breath.   ALPRAZOLAM (XANAX) 0.25 MG TABLET    Take 1 tablet (0.25 mg total) by mouth 2 (two) times daily as needed for anxiety.   AMLODIPINE (NORVASC) 10 MG TABLET    Take 0.5 tablets (5 mg total) by mouth daily.   ATORVASTATIN (LIPITOR) 40 MG TABLET    Take 1 tablet by mouth daily.   BETHANECHOL (URECHOLINE) 10 MG TABLET    Take 1 tablet (10 mg total) by mouth 3 (three) times daily.   BISACODYL (DULCOLAX) 10 MG SUPPOSITORY    Place 1 suppository (10 mg total) rectally daily as needed for moderate constipation.   CEFAZOLIN (ANCEF) 2-4 GM/100ML-% IVPB    Inject 100 mLs (2 g total) into the vein every 8 (eight) hours.   CHOLECALCIFEROL (VITAMIN D3) 25 MCG (1000 UNIT) TABLET    1 tablet   COLLAGENASE (SANTYL) OINTMENT    Apply topically daily.   DULAGLUTIDE (TRULICITY) 3 XV/4.0GQ SOPN    INJECT 1 PEN ONCE WEEKLY   GABAPENTIN (NEURONTIN) 100 MG CAPSULE    Take 2 capsules (200 mg total) by mouth 2 (two) times daily.   HYDROXYZINE (ATARAX/VISTARIL) 10 MG TABLET    Take 1 tablet (10 mg total) by mouth 3 (three) times daily as needed for anxiety.   INSULIN  GLARGINE (LANTUS) 100 UNIT/ML SOLOSTAR PEN    Inject 8 Units into the skin at bedtime.   INSULIN GLARGINE (LANTUS) 100 UNIT/ML SOLOSTAR PEN    Inject 8 Units into the skin at bedtime.   INSULIN PEN NEEDLE (PENTIPS) 32G X 4 MM MISC    use as directed   LIDOCAINE (XYLOCAINE) 2 % JELLY    Place 1 application into the urethra every 6 (six) hours.   METHOCARBAMOL (ROBAXIN) 500 MG TABLET    Take 1 tablet (500 mg total) by mouth every 6 (six) hours as needed for muscle spasms.   METOPROLOL TARTRATE (LOPRESSOR) 50 MG TABLET    Take 1 tablet (50 mg total) by mouth 2 (two)  times daily.   MIRTAZAPINE (REMERON) 15 MG TABLET    Take 1 tablet (15 mg total) by mouth at bedtime.   MORPHINE (MS CONTIN) 15 MG 12 HR TABLET    Take 1 tablet (15 mg total) by mouth every 12 (twelve) hours. Can fill 12/5   MULTIPLE VITAMIN (MULTIVITAMIN ADULT PO)    Take 1 tablet by mouth daily.   MUPIROCIN OINTMENT (BACTROBAN) 2 %    Apply 1 application topically 2 (two) times daily.   NALOXONE (NARCAN) NASAL SPRAY 4 MG/0.1 ML    Use as needed for signs of overdose.   NUTRITION SUPPLEMENT, JUVEN, (JUVEN) PACK    Take 1 packet by mouth 2 (two) times daily between meals.   OXYCODONE-ACETAMINOPHEN (PERCOCET) 10-325 MG TABLET    Take 1 tablet by mouth every 6 (six) hours as needed for pain. Can fill 09/28/21   OXYCODONE-ACETAMINOPHEN (PERCOCET/ROXICET) 5-325 MG TABLET    Take 1-2 tablets by mouth every 8 (eight) hours as needed for moderate pain.   PANTOPRAZOLE (PROTONIX) 40 MG TABLET    Take 1 tablet (40 mg total) by mouth 2 (two) times daily.   PAROXETINE (PAXIL) 20 MG TABLET    Take 1 tablet (20 mg total) by mouth at bedtime.   POLYETHYLENE GLYCOL (MIRALAX / GLYCOLAX) 17 G PACKET    Take 17 g by mouth daily as needed for moderate constipation, mild constipation or severe constipation.   SENNA-DOCUSATE (SENOKOT-S) 8.6-50 MG TABLET    Take 2 tablets by mouth at bedtime as needed for mild constipation.   SUCRALFATE (CARAFATE) 1 GM/10ML SUSPENSION    Take 10 mLs (1 g total) by mouth 4 (four) times daily -  with meals and at bedtime.   TAMSULOSIN (FLOMAX) 0.4 MG CAPS CAPSULE    Take 1 capsule (0.4 mg total) by mouth daily after supper.  Modified Medications   No medications on file  Discontinued Medications   No medications on file    History of Present Illness: Here for HFU for thoracic discitis and osteomyelitis/epidural phlegmon and coccygeal osteomyelitis. Patient was initially planned for 6 weeks of IV abtx ending in 12/1. However, patient had MRI done for a coccygeal pressure ulcer which was  concerning for osteomyelitis and IV cefazolin was extended until beginning of Jan 2023. This was supposed to be an in person visit but patient changed to phone visit due to cold weather.  Spoke with patient and his wife over the phone. Back pain is 4-5/10 and is 7/10 when wound dressing. Getting morphine tablet BID and percocet as needed for pain control.  Getting IV cefazolin through PICC without any issues. Denies any issues with the PICC line. Denies fevers, chills and sweats. Denies nausea, vomiting and diarrhea. He feels he is getting stronger every  day. He is also going to follow up with wound care. Home health came in for PICC dressing and labs yesterday. Discussed with him to get repeat MRI T spine around end date of abtx for follow up on previously seen phlegmon and epidural abscesses and fu in early January- agrees to the plan.   ROS: 10 point ROS done with pertinent positives and negative listed above   Past Medical History:  Diagnosis Date   AKI (acute kidney injury) ([place])    GAD (generalized anxiety disorder)    Hyperlipidemia    Macular degeneration, bilateral    Retinopathy    Type II diabetes mellitus with complication, uncontrolled    retinopathy, neuropathy, microalbuminuria    Social History   Tobacco Use   Smoking status: Former    Packs/day: 1.00    Years: 20.00    Pack years: 20.00    Types: Cigarettes    Quit date: 07/2020    Years since quitting: 1.1   Smokeless tobacco: Never  Vaping Use   Vaping Use: Never used  Substance Use Topics   Alcohol use: Not Currently   Drug use: Never    Family History  Problem Relation Age of Onset   Diabetes Mother    Hyperlipidemia Mother    Stroke Mother    Diabetes Father    Hyperlipidemia Brother    Stroke Brother    ADD / ADHD Brother    ADD / ADHD Son     Allergies  Allergen Reactions   Cranberry Itching   Hm Lidocaine Patch [Lidocaine] Dermatitis    Blisters skin    Melatonin Other (See Comments)     nightmares    Health Maintenance  Topic Date Due   COLONOSCOPY (Pts 45-72yr Insurance coverage will need to be confirmed)  Never done   Pneumococcal Vaccine [id number] old (2 - PCV) 07/12/2019   URINE MICROALBUMIN  04/11/2020   COVID-19 Vaccine (3 - Booster for Pfizer series) 04/17/2020   TETANUS/TDAP  10/25/2020   OPHTHALMOLOGY EXAM  11/20/2020   INFLUENZA VACCINE  05/25/2021   HEMOGLOBIN A1C  12/12/2021   FOOT EXAM  06/11/2022   Hepatitis C Screening  Completed   HIV Screening  Completed   HPV VACCINES  Aged Out   Pertinent Imagings MRI Sacrum 09/18/21 IMPRESSION: 1. Deep decubitus ulcer over the distal coccyx with associated marrow changes and enhancement in the distal 2 coccygeal segments consistent with osteomyelitis. 2. The sacrum, sacroiliac joints and remainder of the visualized bony pelvis appear unremarkable. 3. No drainable abscess identified   Observations/Objective:   Assessment and Plan: Decubitus Ulcer with associated coccygeal Osteomyelitis  Thoracic discitis and osteomyelitis  with surrounding paraspinal phlegmon and epidural phlegmon Medication Monitoring  12/5 Cr 1.24, ESR 33, CRP 7  Follow Up Instructions: Continue IV cefazolin until 10/30/21. Repeat MRI T spine to follow up on previously seen abnormality before end date of IV abtx Weekly CBC, BMP, ESR and CRP Follow up with wound care for Decub ulcer Fu in or around 10/30/21, before stop date of abtx   I discussed the assessment and treatment plan with the patient. The patient was provided an opportunity to ask questions and all were answered. The patient agreed with the plan and demonstrated an understanding of the instructions.   The patient was advised to call back or seek an in-person evaluation if the symptoms worsen or if the condition fails to improve as anticipated.  I provided 20 minutes of non-face-to-face time during this  encounter.  Wilber Oliphant, [facility]  [facility] Phone [phone] Fax no. [phone]  09/29/2021, 9:00 AM

## 2021-09-29 NOTE — Telephone Encounter (Signed)
Per verbal order from Dr. West Bali, patient is to extend IV antibiotics until 10/30/21. Relayed orders to [facility] at [facility]. Orders repeated and verified.   Patient scheduled for MRI 10/27/21 and follow up with office 10/29/21.   Beryle Flock, RN

## 2021-09-30 ENCOUNTER — Encounter (HOSPITAL_BASED_OUTPATIENT_CLINIC_OR_DEPARTMENT_OTHER): Payer: 59 | Admitting: Physician Assistant

## 2021-09-30 DIAGNOSIS — Z5181 Encounter for therapeutic drug level monitoring: Secondary | ICD-10-CM | POA: Insufficient documentation

## 2021-10-05 ENCOUNTER — Telehealth: Payer: Self-pay | Admitting: *Deleted

## 2021-10-05 NOTE — Telephone Encounter (Signed)
Prior Josem Kaufmann was submitted to insurance  for 40 mg bid and approved through 09/21/22.

## 2021-10-06 ENCOUNTER — Other Ambulatory Visit: Payer: Self-pay

## 2021-10-06 ENCOUNTER — Telehealth (INDEPENDENT_AMBULATORY_CARE_PROVIDER_SITE_OTHER): Payer: 59 | Admitting: Endocrinology

## 2021-10-06 VITALS — Ht 71.0 in | Wt 180.0 lb

## 2021-10-06 DIAGNOSIS — E1142 Type 2 diabetes mellitus with diabetic polyneuropathy: Secondary | ICD-10-CM

## 2021-10-06 MED ORDER — TRULICITY 0.75 MG/0.5ML ~~LOC~~ SOAJ: 0.7500 mg | SUBCUTANEOUS | 3 refills | Status: DC

## 2021-10-06 NOTE — Progress Notes (Signed)
Subjective:    Patient ID: Jose [name], male    DOB: Dec 18, 1975, 45 y.o.   MRN: [id number]  HPI telehealth visit today via video visit.  Alternatives to telehealth are presented to this patient, and the patient agrees to the telehealth visit. Pt is advised of the cost of the visit, and agrees to this, also.   Patient is at home, and I am at the office.   Persons attending the telehealth visit: the patient, wife, and I.   Pt returns for f/u of diabetes mellitus: DM type: 2 Dx'ed: Trulicity and 2 oral meds Complications: stage 3 CRI and PN Therapy: Trulicity.  DKA: never Severe hypoglycemia: never Pancreatitis: never Pancreatic imaging: never SDOH: none Other: he has never been on insulin Interval history: he recently got out of the hospital.  no cbg record, but states cbg's vary from 63-170.  It is in general higher as the day goes on.  Trulicity was changed to Lantus 8 units QHS.   Past Medical History:  Diagnosis Date   AKI (acute kidney injury) ([place])    GAD (generalized anxiety disorder)    Hyperlipidemia    Macular degeneration, bilateral    Retinopathy    Type II diabetes mellitus with complication, uncontrolled    retinopathy, neuropathy, microalbuminuria    Past Surgical History:  Procedure Laterality Date   APPENDECTOMY     BIOPSY  08/10/2021   Procedure: BIOPSY;  Surgeon: Otis Brace, MD;  Location: [facility];  Service: Gastroenterology;;   BUBBLE STUDY  07/29/2021   Procedure: BUBBLE STUDY;  Surgeon: Jerline Pain, MD;  Location: [facility];  Service: Cardiovascular;;   ESOPHAGOGASTRODUODENOSCOPY (EGD) WITH PROPOFOL N/A 08/10/2021   Procedure: ESOPHAGOGASTRODUODENOSCOPY (EGD) WITH PROPOFOL;  Surgeon: Otis Brace, MD;  Location: [facility];  Service: Gastroenterology;  Laterality: N/A;   ESOPHAGOGASTRODUODENOSCOPY (EGD) WITH PROPOFOL N/A 08/24/2021   Procedure: ESOPHAGOGASTRODUODENOSCOPY (EGD) WITH PROPOFOL;  Surgeon: Otis Brace, MD;   Location: [facility];  Service: Gastroenterology;  Laterality: N/A;   HERNIA REPAIR     IR FLUORO GUIDED NEEDLE PLC ASPIRATION/INJECTION LOC  07/28/2021   LUMBAR LAMINECTOMY/DECOMPRESSION MICRODISCECTOMY N/A 08/07/2021   Procedure: THORACIC FOUR - THORACIC FIVE LAMINECTOMY/DECOMPRESSION OF SPINAL CORD, DEBRIDEMENT OF ABSCESS, MICRODISCECTOMY, INTRAOPERATIVE ULTRASOUND;  Surgeon: Consuella Lose, MD;  Location: [facility];  Service: Neurosurgery;  Laterality: N/A;   TEE WITHOUT CARDIOVERSION N/A 07/29/2021   Procedure: TRANSESOPHAGEAL ECHOCARDIOGRAM (TEE);  Surgeon: Jerline Pain, MD;  Location: [facility];  Service: Cardiovascular;  Laterality: N/A;   TRIGGER FINGER RELEASE Right 10/25/2019   Procedure: RIGHT INDEX FINGER RELEASE TRIGGER FINGER/A-1 PULLEY;  Surgeon: Daryll Brod, MD;  Location: [facility];  Service: Orthopedics;  Laterality: Right;  IV REGIONAL FOREARM BLOCK    Social History   Socioeconomic History   Marital status: Significant Other    Spouse name: Not on file   Number of children: 2   Years of education: Not on file   Highest education level: Bachelor's degree (e.g., BA, AB, BS)  Occupational History   Occupation: Multimedia programmer: [employer]  Tobacco Use   Smoking status: Former    Packs/day: 1.00    Years: 20.00    Pack years: 20.00    Types: Cigarettes    Quit date: 07/2020    Years since quitting: 1.2   Smokeless tobacco: Never  Vaping Use   Vaping Use: Never used  Substance and Sexual Activity   Alcohol use: Not Currently  Drug use: Never   Sexual activity: Yes    Partners: Female    Birth control/protection: None    Comment: with monogamous partner  Other Topics Concern   Not on file  Social History Narrative   Pt has lived 61 of life in [place]. Lives at home with partner, 2 kids, 8 cats, and 1 dog.    Social Determinants of Health   Financial Resource Strain: Not on file  Food Insecurity:  Not on file  Transportation Needs: Not on file  Physical Activity: Not on file  Stress: Not on file  Social Connections: Not on file  Intimate Partner Violence: Not on file    Current Outpatient Medications on File Prior to Visit  Medication Sig Dispense Refill   acetaminophen (TYLENOL) 325 MG tablet Take 1-2 tablets (325-650 mg total) by mouth every 4 (four) hours as needed for mild pain.     albuterol (VENTOLIN HFA) 108 (90 Base) MCG/ACT inhaler Inhale 1 puff into the lungs every 4 (four) hours as needed for wheezing or shortness of breath.     ALPRAZolam (XANAX) 0.25 MG tablet Take 1 tablet (0.25 mg total) by mouth 2 (two) times daily as needed for anxiety. 30 tablet 0   amLODipine (NORVASC) 10 MG tablet Take 0.5 tablets (5 mg total) by mouth daily. 30 tablet 0   atorvastatin (LIPITOR) 40 MG tablet Take 1 tablet by mouth daily.     bethanechol (URECHOLINE) 10 MG tablet Take 1 tablet (10 mg total) by mouth 3 (three) times daily. 90 tablet 0   bisacodyl (DULCOLAX) 10 MG suppository Place 1 suppository (10 mg total) rectally daily as needed for moderate constipation. 12 suppository 0   ceFAZolin (ANCEF) 2-4 GM/100ML-% IVPB Inject 100 mLs (2 g total) into the vein every 8 (eight) hours. 99 each 0   cholecalciferol (VITAMIN D3) 25 MCG (1000 UNIT) tablet 1 tablet     collagenase (SANTYL) ointment Apply topically daily. 30 g 0   gabapentin (NEURONTIN) 100 MG capsule Take 2 capsules (200 mg total) by mouth 2 (two) times daily. 120 capsule 0   hydrOXYzine (ATARAX/VISTARIL) 10 MG tablet Take 1 tablet (10 mg total) by mouth 3 (three) times daily as needed for anxiety. 60 tablet 0   Insulin Pen Needle (PENTIPS) 32G X 4 MM MISC use as directed 100 each 1   lidocaine (XYLOCAINE) 2 % jelly Place 1 application into the urethra every 6 (six) hours. 30 mL 1   methocarbamol (ROBAXIN) 500 MG tablet Take 1 tablet (500 mg total) by mouth every 6 (six) hours as needed for muscle spasms. 60 tablet 0   metoprolol  tartrate (LOPRESSOR) 50 MG tablet Take 1 tablet (50 mg total) by mouth 2 (two) times daily. 60 tablet 0   mirtazapine (REMERON) 15 MG tablet Take 1 tablet (15 mg total) by mouth at bedtime. 30 tablet 0   morphine (MS CONTIN) 15 MG 12 hr tablet Take 1 tablet (15 mg total) by mouth every 12 (twelve) hours. Can fill 12/5 60 tablet 0   Multiple Vitamin (MULTIVITAMIN ADULT PO) Take 1 tablet by mouth daily.     mupirocin ointment (BACTROBAN) 2 % Apply 1 application topically 2 (two) times daily. 66 g 0   naloxone (NARCAN) nasal spray 4 mg/0.1 mL Use as needed for signs of overdose. 2 each 1   nutrition supplement, JUVEN, (JUVEN) PACK Take 1 packet by mouth 2 (two) times daily between meals. 60 packet 1   oxyCODONE-acetaminophen (PERCOCET) 10-325  MG tablet Take 1 tablet by mouth every 6 (six) hours as needed for pain. Can fill 09/28/21 120 tablet 0   oxyCODONE-acetaminophen (PERCOCET/ROXICET) 5-325 MG tablet Take 1-2 tablets by mouth every 8 (eight) hours as needed for moderate pain. 42 tablet 0   pantoprazole (PROTONIX) 40 MG tablet Take 1 tablet (40 mg total) by mouth 2 (two) times daily. 60 tablet 0   PARoxetine (PAXIL) 20 MG tablet Take 1 tablet (20 mg total) by mouth at bedtime. 30 tablet 0   polyethylene glycol (MIRALAX / GLYCOLAX) 17 g packet Take 17 g by mouth daily as needed for moderate constipation, mild constipation or severe constipation. 14 each 0   senna-docusate (SENOKOT-S) 8.6-50 MG tablet Take 2 tablets by mouth at bedtime as needed for mild constipation. 60 tablet 0   sucralfate (CARAFATE) 1 GM/10ML suspension Take 10 mLs (1 g total) by mouth 4 (four) times daily -  with meals and at bedtime. 420 mL 0   tamsulosin (FLOMAX) 0.4 MG CAPS capsule Take 1 capsule (0.4 mg total) by mouth daily after supper. 30 capsule 0   No current facility-administered medications on file prior to visit.    Allergies  Allergen Reactions   Cranberry Itching   Hm Lidocaine Patch [Lidocaine] Dermatitis     Blisters skin    Melatonin Other (See Comments)    nightmares    Family History  Problem Relation Age of Onset   Diabetes Mother    Hyperlipidemia Mother    Stroke Mother    Diabetes Father    Hyperlipidemia Brother    Stroke Brother    ADD / ADHD Brother    ADD / ADHD Son     Ht 5\' 11"  (1.803 m)    Wt 180 lb (81.6 kg)    BMI 25.10 kg/m   Review of Systems He has lost 12 lbs since last ov here.      Objective:   Physical Exam   Lab Results  Component Value Date   CREATININE 1.36 (H) 09/21/2021   BUN 18 09/21/2021   NA 132 (L) 09/21/2021   K 3.7 09/21/2021   CL 99 09/21/2021   CO2 25 09/21/2021       Assessment & Plan:  Type 2 DM: overcontrolled Hypoglycemia, due to insulin: we'll favor GLP.   Patient Instructions  I have sent a prescription to your pharmacy, to change Lantus to Trulicity.   check your blood sugar once a day.  vary the time of day when you check, between before the 3 meals, and at bedtime.  also check if you have symptoms of your blood sugar being too high or too low.  please keep a record of the readings and bring it to your next appointment here (or you can bring the meter itself).  You can write it on any piece of paper.  please call us sooner if your blood sugar goes below 70, or if most of your readings are over 200.   Please come back for a follow-up appointment in 2 months.

## 2021-10-06 NOTE — Patient Instructions (Addendum)
I have sent a prescription to your pharmacy, to change Lantus to Trulicity.   check your blood sugar once a day.  vary the time of day when you check, between before the 3 meals, and at bedtime.  also check if you have symptoms of your blood sugar being too high or too low.  please keep a record of the readings and bring it to your next appointment here (or you can bring the meter itself).  You can write it on any piece of paper.  please call us sooner if your blood sugar goes below 70, or if most of your readings are over 200.   Please come back for a follow-up appointment in 2 months.

## 2021-10-07 ENCOUNTER — Encounter (HOSPITAL_BASED_OUTPATIENT_CLINIC_OR_DEPARTMENT_OTHER): Payer: 59 | Attending: Physician Assistant | Admitting: Physician Assistant

## 2021-10-07 ENCOUNTER — Other Ambulatory Visit: Payer: Self-pay

## 2021-10-07 DIAGNOSIS — M4628 Osteomyelitis of vertebra, sacral and sacrococcygeal region: Secondary | ICD-10-CM | POA: Diagnosis not present

## 2021-10-07 DIAGNOSIS — E11622 Type 2 diabetes mellitus with other skin ulcer: Secondary | ICD-10-CM | POA: Diagnosis not present

## 2021-10-07 DIAGNOSIS — E1122 Type 2 diabetes mellitus with diabetic chronic kidney disease: Secondary | ICD-10-CM | POA: Insufficient documentation

## 2021-10-07 DIAGNOSIS — M6281 Muscle weakness (generalized): Secondary | ICD-10-CM | POA: Diagnosis not present

## 2021-10-07 DIAGNOSIS — N183 Chronic kidney disease, stage 3 unspecified: Secondary | ICD-10-CM | POA: Insufficient documentation

## 2021-10-07 DIAGNOSIS — L0501 Pilonidal cyst with abscess: Secondary | ICD-10-CM | POA: Insufficient documentation

## 2021-10-07 DIAGNOSIS — L89153 Pressure ulcer of sacral region, stage 3: Secondary | ICD-10-CM | POA: Insufficient documentation

## 2021-10-07 NOTE — Progress Notes (Addendum)
Jose [name], Jose [name] ([id number]) Visit Report for 10/07/2021 Chief Complaint Document Details Patient Name: Date of Service: Jose [name], Jose [name] 10/07/2021 10:00 A M Medical Record Number: [id number] Patient Account Number: [id number] Date of Birth/Sex: Treating RN: 1975/12/01 (45 y.o. Jose [name] Primary Care Provider: Eilene [name] Other Clinician: Referring Provider: Treating Provider/Extender: Jose [name] in Treatment: 2 Information Obtained from: Patient Chief Complaint Sacral abscess with osteomyelitis Electronic Signature(s) Signed: 10/07/2021 10:25:11 AM By: Jose Keeler PA-C Entered By: Jose [name] on 10/07/2021 10:25:11 -------------------------------------------------------------------------------- Debridement Details Patient Name: Date of Service: Jose [name], Jose [name]. 10/07/2021 10:00 A M Medical Record Number: [id number] Patient Account Number: [id number] Date of Birth/Sex: Treating RN: 1975/11/07 (45 y.o. Jose [name] Primary Care Provider: Eilene [name] Other Clinician: Referring Provider: Treating Provider/Extender: Jose [name] in Treatment: 2 Debridement Performed for Assessment: Wound #1 Sacrum Performed By: Physician Jose Keeler, PA Debridement Type: Debridement Level of Consciousness (Pre-procedure): Awake and Alert Pre-procedure Verification/Time Out Yes - 10:34 Taken: Start Time: 10:35 Pain Control: Lidocaine 5% topical ointment T Area Debrided (L x W): otal 4 (cm) x 2.5 (cm) = 10 (cm) Tissue and other material debrided: Non-Viable, Slough, Subcutaneous, Biofilm, Slough Level: Skin/Subcutaneous Tissue Debridement Description: Excisional Instrument: Curette Bleeding: Minimum Hemostasis Achieved: Pressure End Time: 10:39 Response to Treatment: Procedure was tolerated well Level of Consciousness (Post- Awake and Alert procedure): Post Debridement Measurements of Total  Wound Length: (cm) 4 Stage: Category/Stage III Width: (cm) 2.5 Depth: (cm) 2.5 Volume: (cm) 19.635 Character of Wound/Ulcer Post Debridement: Stable Post Procedure Diagnosis Same as Pre-procedure Electronic Signature(s) Signed: 10/07/2021 3:33:06 PM By: Jose Keeler PA-C Signed: 10/07/2021 5:19:40 PM By: Jose [name] Entered By: Jose [name] on 10/07/2021 10:39:34 -------------------------------------------------------------------------------- HPI Details Patient Name: Date of Service: Jose [name]. 10/07/2021 10:00 A M Medical Record Number: [id number] Patient Account Number: [id number] Date of Birth/Sex: Treating RN: Feb 07, 1976 (45 y.o. Jose [name] Primary Care Provider: Eilene [name] Other Clinician: Referring Provider: Treating Provider/Extender: Jose [name] in Treatment: 2 History of Present Illness HPI Description: 09/23/2021 upon evaluation today patient presents for initial inspection here in the clinic concerning a wound that he has over the sacral region. He unfortunately has been experiencing issues with a pilonidal cyst that opened in September 2022. It was also noted in the end that he also had osteomyelitis in the sacral region. That [name] quite unfortunate he still on IV antibiotics for that his wife helps to manage this at home. This [name] a stage III pressure ulcer. Subsequently the patient also has diabetes mellitus type 2, generalized weakness, and again the history of pilonidal cyst in this area with abscess. He does have chronic kidney disease stage III. His most recent hemoglobin A1c was 7.1 at last check. He does not have home health his wife [name] the one performing all the dressing changes currently they have been doing Santyl with Dakin's moistened gauze behind it 2 times a day. 10/07/2021 upon evaluation today patient appears to be doing well with regard to his wound in general. He has been tolerating the dressing  changes without complication. In fact the Dakin's moistened gauze has been doing excellent I think based on what I am seeing and overall I am extremely pleased with where we stand I think he [name] in a good spot to begin with the wound VAC to be honest. Electronic Signature(s) Signed: 10/07/2021 12:17:42 PM By: Jose Lai  III, Cove Haydon PA-C Entered By: Jose [name] on 10/07/2021 12:17:42 -------------------------------------------------------------------------------- Physical Exam Details Patient Name: Date of Service: Jose [name], [name] 10/07/2021 10:00 A M Medical Record Number: [id number] Patient Account Number: [id number] Date of Birth/Sex: Treating RN: 1976/06/19 (45 y.o. Jose [name] Primary Care Provider: Eilene [name] Other Clinician: Referring Provider: Treating Provider/Extender: Jose [name] in Treatment: 2 Constitutional Well-nourished and well-hydrated in no acute distress. Respiratory normal breathing without difficulty. Psychiatric this patient [name] able to make decisions and demonstrates good insight into disease process. Alert and Oriented x 3. pleasant and cooperative. Notes Upon inspection patient's wound bed showed signs of good granulation and epithelization at this point. I did perform some sharp debridement to clear away some of the necrotic debris he tolerated that today without complication postdebridement wound bed appears to be doing significantly better which [name] great news. Electronic Signature(s) Signed: 10/07/2021 12:18:01 PM By: Jose Keeler PA-C Entered By: Jose [name] on 10/07/2021 12:18:01 -------------------------------------------------------------------------------- Physician Orders Details Patient Name: Date of Service: Jose [name], Jose K. 10/07/2021 10:00 A M Medical Record Number: [id number] Patient Account Number: [id number] Date of Birth/Sex: Treating RN: 1976/07/14 (45 y.o. Jose [name] Primary Care  Provider: Eilene [name] Other Clinician: Referring Provider: Treating Provider/Extender: Jose [name] in Treatment: 2 Verbal / Phone Orders: No Diagnosis Coding ICD-10 Coding Code Description [phone] Pressure ulcer of sacral region, stage 3 M46.28 Osteomyelitis of vertebra, sacral and sacrococcygeal region E11.622 Type 2 diabetes mellitus with other skin ulcer M62.81 Muscle weakness (generalized) L05.01 Pilonidal cyst with abscess N18.30 Chronic kidney disease, stage 3 unspecified Follow-up Appointments ppointment in 1 week. - with Margarita Grizzle Return A Other: - [place] = Supplies Bathing/ Shower/ Hygiene May shower and wash wound with soap and water. - when changing dressing Negative Presssure Wound Therapy Wound Vac to wound continuously at 141mm/hg pressure - 10/07/21: Will order wound vac from [employer], once obtained patient will come Mon/Thurs one week and Mon/Wed the next week so can stay on Tonyetta Berko's schedule. Black Foam - May use Duoderm to help keep seal Off-Loading Roho cushion for wheelchair Turn and reposition every 2 hours Additional Orders / Instructions Follow Nutritious Diet - High Protein Diet Wound Treatment Wound #1 - Sacrum Cleanser: Soap and Water 2 x Per Day/30 Days Discharge Instructions: May shower and wash wound with dial antibacterial soap and water prior to dressing change. Cleanser: Wound Cleanser 2 x Per Day/30 Days Discharge Instructions: Cleanse the wound with wound cleanser prior to applying a clean dressing using gauze sponges, not tissue or cotton balls. Peri-Wound Care: Skin Prep (Generic) 2 x Per Day/30 Days Discharge Instructions: Use skin prep as directed Topical: Dakins Solution 2 x Per Day/30 Days Secondary Dressing: Woven Gauze Sponge, Non-Sterile 4x4 in (Generic) 2 x Per Day/30 Days Discharge Instructions: Apply Dakins moistened to wound bed, cover with dry gauze Secondary Dressing: Zetuvit Plus Silicone Border Dressing 5x5  (in/in) (Generic) 2 x Per Day/30 Days Discharge Instructions: Apply silicone border over primary dressing as directed. Electronic Signature(s) Signed: 10/07/2021 3:33:06 PM By: Jose Keeler PA-C Signed: 10/07/2021 5:19:40 PM By: Jose [name] Entered By: Jose [name] on 10/07/2021 10:52:42 -------------------------------------------------------------------------------- Problem List Details Patient Name: Date of Service: Jose [name], Jose Ausburn [name]. 10/07/2021 10:00 A M Medical Record Number: [id number] Patient Account Number: [id number] Date of Birth/Sex: Treating RN: 05-22-1976 (45 y.o. Jose [name] Primary Care Provider: Eilene [name] Other Clinician: Referring Provider: Treating Provider/Extender: Jose [name]  Jose [name] Weeks in Treatment: 2 Active Problems ICD-10 Encounter Code Description Active Date MDM Diagnosis L89.153 Pressure ulcer of sacral region, stage 3 09/23/2021 No Yes M46.28 Osteomyelitis of vertebra, sacral and sacrococcygeal region 09/23/2021 No Yes E11.622 Type 2 diabetes mellitus with other skin ulcer 09/23/2021 No Yes M62.81 Muscle weakness (generalized) 09/23/2021 No Yes L05.01 Pilonidal cyst with abscess 09/23/2021 No Yes N18.30 Chronic kidney disease, stage 3 unspecified 09/23/2021 No Yes Inactive Problems Resolved Problems Electronic Signature(s) Signed: 10/07/2021 10:24:56 AM By: Jose Keeler PA-C Previous Signature: 10/07/2021 10:14:08 AM Version By: Jose [name] Entered By: Jose [name] on 10/07/2021 10:24:56 -------------------------------------------------------------------------------- Progress Note Details Patient Name: Date of Service: Jose [name]. 10/07/2021 10:00 A M Medical Record Number: [id number] Patient Account Number: [id number] Date of Birth/Sex: Treating RN: 03-Jun-1976 (45 y.o. Jose [name] Primary Care Provider: Eilene [name] Other Clinician: Referring Provider: Treating Provider/Extender:  Jose [name] in Treatment: 2 Subjective Chief Complaint Information obtained from Patient Sacral abscess with osteomyelitis History of Present Illness (HPI) 09/23/2021 upon evaluation today patient presents for initial inspection here in the clinic concerning a wound that he has over the sacral region. He unfortunately has been experiencing issues with a pilonidal cyst that opened in September 2022. It was also noted in the end that he also had osteomyelitis in the sacral region. That [name] quite unfortunate he still on IV antibiotics for that his wife helps to manage this at home. This [name] a stage III pressure ulcer. Subsequently the patient also has diabetes mellitus type 2, generalized weakness, and again the history of pilonidal cyst in this area with abscess. He does have chronic kidney disease stage III. His most recent hemoglobin A1c was 7.1 at last check. He does not have home health his wife [name] the one performing all the dressing changes currently they have been doing Santyl with Dakin's moistened gauze behind it 2 times a day. 10/07/2021 upon evaluation today patient appears to be doing well with regard to his wound in general. He has been tolerating the dressing changes without complication. In fact the Dakin's moistened gauze has been doing excellent I think based on what I am seeing and overall I am extremely pleased with where we stand I think he [name] in a good spot to begin with the wound VAC to be honest. Objective Constitutional Well-nourished and well-hydrated in no acute distress. Vitals Time Taken: 10:02 AM, Height: 71 in, Weight: 180 lbs, BMI: 25.1, Temperature: 98.3 F, Pulse: 73 bpm, Respiratory Rate: 18 breaths/min, Blood Pressure: 148/84 mmHg. Respiratory normal breathing without difficulty. Psychiatric this patient [name] able to make decisions and demonstrates good insight into disease process. Alert and Oriented x 3. pleasant and  cooperative. General Notes: Upon inspection patient's wound bed showed signs of good granulation and epithelization at this point. I did perform some sharp debridement to clear away some of the necrotic debris he tolerated that today without complication postdebridement wound bed appears to be doing significantly better which [name] great news. Integumentary (Hair, Skin) Wound #1 status [name] Open. Original cause of wound was Pressure Injury. The date acquired was: 07/22/2021. The wound has been in treatment 2 weeks. The wound [name] located on the Sacrum. The wound measures 4cm length x 2.5cm width x 2.5cm depth; 7.854cm^2 area and 19.635cm^3 volume. There [name] Fat Layer (Subcutaneous Tissue) exposed. There [name] no tunneling noted, however, there [name] undermining starting at 1:00 and ending at 6:00 with a maximum distance of 2.5cm.  There [name] a medium amount of serosanguineous drainage noted. The wound margin [name] well defined and not attached to the wound base. There [name] large (67- 100%) red, pink granulation within the wound bed. There [name] a small (1-33%) amount of necrotic tissue within the wound bed including Adherent Slough. Assessment Active Problems ICD-10 Pressure ulcer of sacral region, stage 3 Osteomyelitis of vertebra, sacral and sacrococcygeal region Type 2 diabetes mellitus with other skin ulcer Muscle weakness (generalized) Pilonidal cyst with abscess Chronic kidney disease, stage 3 unspecified Procedures Wound #1 Pre-procedure diagnosis of Wound #1 [name] a Pressure Ulcer located on the Sacrum . There was a Excisional Skin/Subcutaneous Tissue Debridement with a total area of 10 sq cm performed by Jose Keeler, PA. With the following instrument(s): Curette to remove Non-Viable tissue/material. Material removed includes Subcutaneous Tissue, Slough, and Biofilm after achieving pain control using Lidocaine 5% topical ointment. No specimens were taken. A time out was conducted at 10:34, prior to the start  of the procedure. A Minimum amount of bleeding was controlled with Pressure. The procedure was tolerated well. Post Debridement Measurements: 4cm length x 2.5cm width x 2.5cm depth; 19.635cm^3 volume. Post debridement Stage noted as Category/Stage III. Character of Wound/Ulcer Post Debridement [name] stable. Post procedure Diagnosis Wound #1: Same as Pre-Procedure Plan Follow-up Appointments: Return Appointment in 1 week. - with Margarita Grizzle Other: [employer] = Supplies Bathing/ Shower/ Hygiene: May shower and wash wound with soap and water. - when changing dressing Negative Presssure Wound Therapy: Wound Vac to wound continuously at 168mm/hg pressure - 10/07/21: Will order wound vac from [facility], once obtained patient will come Mon/Thurs one week and Mon/Wed the next week so can stay on Dilana Mcphie's schedule. Black Foam - May use Duoderm to help keep seal Off-Loading: Roho cushion for wheelchair Turn and reposition every 2 hours Additional Orders / Instructions: Follow Nutritious Diet - High Protein Diet WOUND #1: - Sacrum Wound Laterality: Cleanser: Soap and Water 2 x Per Day/30 Days Discharge Instructions: May shower and wash wound with dial antibacterial soap and water prior to dressing change. Cleanser: Wound Cleanser 2 x Per Day/30 Days Discharge Instructions: Cleanse the wound with wound cleanser prior to applying a clean dressing using gauze sponges, not tissue or cotton balls. Peri-Wound Care: Skin Prep (Generic) 2 x Per Day/30 Days Discharge Instructions: Use skin prep as directed Topical: Dakins Solution 2 x Per Day/30 Days Secondary Dressing: Woven Gauze Sponge, Non-Sterile 4x4 in (Generic) 2 x Per Day/30 Days Discharge Instructions: Apply Dakins moistened to wound bed, cover with dry gauze Secondary Dressing: Zetuvit Plus Silicone Border Dressing 5x5 (in/in) (Generic) 2 x Per Day/30 Days Discharge Instructions: Apply silicone border over primary dressing as directed. 1. Would recommend currently  that we go ahead and initiate treatment with the continuation right now of the Dakin's moistened gauze dressing. That being said I do think that it would be beneficial for the patient to have a wound VAC and we discussed that today we will get a go ahead and see about getting that ordered. 2. I am also can recommend that we have the patient continue to offload [name] much as possible to help keep edema under good control. Obviously I think the more this he does the better. Overall I think he [name] doing pretty good job and his wife [name] doing all some taking care of the wound. We will see patient back for reevaluation in 1 week here in the clinic. If anything worsens or changes patient will contact our  office for additional recommendations. Electronic Signature(s) Signed: 10/07/2021 12:18:42 PM By: Jose Keeler PA-C Entered By: Jose [name] on 10/07/2021 12:18:40 -------------------------------------------------------------------------------- SuperBill Details Patient Name: Date of Service: Jose [name], Jose Mcquaig [name]. 10/07/2021 Medical Record Number: [id number] Patient Account Number: [id number] Date of Birth/Sex: Treating RN: 1976/01/08 (45 y.o. Jose [name] Primary Care Provider: Eilene [name] Other Clinician: Referring Provider: Treating Provider/Extender: Jose [name] in Treatment: 2 Diagnosis Coding ICD-10 Codes Code Description [phone] Pressure ulcer of sacral region, stage 3 M46.28 Osteomyelitis of vertebra, sacral and sacrococcygeal region E11.622 Type 2 diabetes mellitus with other skin ulcer M62.81 Muscle weakness (generalized) L05.01 Pilonidal cyst with abscess N18.30 Chronic kidney disease, stage 3 unspecified Facility Procedures CPT4 Code: [id number] IC L Description: 66060 - DEB SUBQ TISSUE 20 SQ CM/< ICD-10 Diagnosis Description D-10 Diagnosis Description 89.153 Pressure ulcer of sacral region, stage 3 Modifier: Quantity: 1 Physician Procedures :  CPT4 Code Description Modifier [id number] - WC PHYS LEVEL 4 - EST PT 25 ICD-10 Diagnosis Description L89.153 Pressure ulcer of sacral region, stage 3 M46.28 Osteomyelitis of vertebra, sacral and sacrococcygeal region E11.622 Type 2 diabetes mellitus  with other skin ulcer M62.81 Muscle weakness (generalized) Quantity: 1 : [id number] 11042 - WC PHYS SUBQ TISS 20 SQ CM ICD-10 Diagnosis Description L89.153 Pressure ulcer of sacral region, stage 3 Quantity: 1 Electronic Signature(s) Signed: 10/07/2021 12:19:00 PM By: Jose Keeler PA-C Entered By: Jose [name] on 10/07/2021 12:19:00

## 2021-10-07 NOTE — Progress Notes (Signed)
JERAMINE, DELIS ([id number]) Visit Report for 10/07/2021 Arrival Information Details Patient Name: Date of Service: REA EDMAN, LIPSEY 10/07/2021 10:00 A M Medical Record Number: [id number] Patient Account Number: [id number] Date of Birth/Sex: Treating RN: 1976/05/23 (45 y.o. [name] Primary Care Aayana Reinertsen: [name] Other Clinician: Referring Quintavious Rinck: Treating Frandy Basnett/Extender: Cristina Gong in Treatment: 2 Visit Information History Since Last Visit Added or deleted any medications: No Patient Arrived: Wheel Chair Any new allergies or adverse reactions: No Arrival Time: 10:01 Had a fall or experienced change in No Accompanied By: wife activities of daily living that may affect Transfer Assistance: Manual risk of falls: Patient Identification Verified: Yes Signs or symptoms of abuse/neglect since last visito No Secondary Verification Process Completed: Yes Hospitalized since last visit: No Patient Requires Transmission-Based Precautions: No Implantable device outside of the clinic excluding No Patient Has Alerts: No cellular tissue based products placed in the center since last visit: Pain Present Now: Yes Electronic Signature(s) Signed: 10/07/2021 3:58:17 PM By: Sandre Kitty Entered By: Sandre Kitty on 10/07/2021 10:02:22 -------------------------------------------------------------------------------- Encounter Discharge Information Details Patient Name: Date of Service: REA Cristal Deer. 10/07/2021 10:00 A M Medical Record Number: [id number] Patient Account Number: [id number] Date of Birth/Sex: Treating RN: April 16, 1976 (45 y.o. Marcheta Grammes Primary Care Madisin Hasan: [name] Other Clinician: Referring Arias Weinert: Treating Efraim Vanallen/Extender: Cristina Gong in Treatment: 2 Encounter Discharge Information Items Post Procedure Vitals Discharge Condition: Stable Temperature (F):  98.3 Ambulatory Status: Wheelchair Pulse (bpm): 73 Discharge Destination: Home Respiratory Rate (breaths/min): 18 Transportation: Private Auto Blood Pressure (mmHg): 148/84 Accompanied By: wife Schedule Follow-up Appointment: Yes Clinical Summary of Care: Provided on 10/07/2021 Form Type Recipient Paper Patient Patient Electronic Signature(s) Signed: 10/07/2021 4:16:59 PM By: Lorrin Jackson Entered By: Lorrin Jackson on 10/07/2021 16:16:59 -------------------------------------------------------------------------------- Lower Extremity Assessment Details Patient Name: Date of Service: REA Jearld Shines K. 10/07/2021 10:00 A M Medical Record Number: [id number] Patient Account Number: [id number] Date of Birth/Sex: Treating RN: [birth year] (45 y.o. Marcheta Grammes Primary Care Whitfield Dulay: [name] Other Clinician: Referring Kristl Morioka: Treating Kinleigh Nault/Extender: Gillie Manners, Loni Muse in Treatment: 2 Electronic Signature(s) Signed: 10/07/2021 10:13:31 AM By: Fara Chute By: Lorrin Jackson on 10/07/2021 10:13:30 -------------------------------------------------------------------------------- Multi-Disciplinary Care Plan Details Patient Name: Date of Service: REA Judeen Hammans, Hoyt Koch. 10/07/2021 10:00 A M Medical Record Number: [id number] Patient Account Number: [id number] Date of Birth/Sex: Treating RN: 1976/09/27 (45 y.o. Marcheta Grammes Primary Care Jabarie Pop: [name] Other Clinician: Referring Jewelz Ricklefs: Treating Loveta Dellis/Extender: Cristina Gong in Treatment: 2 Active Inactive Osteomyelitis Nursing Diagnoses: Infection: osteomyelitis Goals: Patient/caregiver will verbalize understanding of disease process and disease management Date Initiated: 09/23/2021 Target Resolution Date: 10/21/2021 Goal Status: Active Interventions: Assess for signs and symptoms of osteomyelitis resolution every visit Provide education  on osteomyelitis Notes: Wound/Skin Impairment Nursing Diagnoses: Impaired tissue integrity Goals: Patient/caregiver will verbalize understanding of skin care regimen Date Initiated: 09/23/2021 Target Resolution Date: 10/21/2021 Goal Status: Active Ulcer/skin breakdown will have a volume reduction of 30% by week 4 Date Initiated: 09/23/2021 Target Resolution Date: 10/21/2021 Goal Status: Active Interventions: Assess patient/caregiver ability to obtain necessary supplies Assess patient/caregiver ability to perform ulcer/skin care regimen upon admission and as needed Assess ulceration(s) every visit Provide education on ulcer and skin care Treatment Activities: Topical wound management initiated : 09/23/2021 Notes: Electronic Signature(s) Signed: 10/07/2021 5:19:40 PM By: Lorrin Jackson Entered By: Lorrin Jackson on 10/07/2021 10:18:07 -------------------------------------------------------------------------------- Pain Assessment Details Patient  Name: Date of Service: REA CAYNE, YOM 10/07/2021 10:00 A M Medical Record Number: [id number] Patient Account Number: [id number] Date of Birth/Sex: Treating RN: 03-Mar-1976 (45 y.o. [name] Primary Care Nahara Dona: [name] Other Clinician: Referring Terrence Pizana: Treating Cypress Hinkson/Extender: Cristina Gong in Treatment: 2 Active Problems Location of Pain Severity and Description of Pain Patient Has Paino Yes Site Locations Rate the pain. Current Pain Level: 5 Pain Management and Medication Current Pain Management: Electronic Signature(s) Signed: 10/07/2021 3:58:17 PM By: Sandre Kitty Signed: 10/07/2021 4:24:09 PM By: Baruch Gouty RN, BSN Entered By: Sandre Kitty on 10/07/2021 10:03:11 -------------------------------------------------------------------------------- Patient/Caregiver Education Details Patient Name: Date of Service: REA Cristal Deer 12/14/2022andnbsp10:00 A  M Medical Record Number: [id number] Patient Account Number: [id number] Date of Birth/Gender: Treating RN: May 05, 1976 (45 y.o. Marcheta Grammes Primary Care Physician: [name] Other Clinician: Referring Physician: Treating Physician/Extender: Cristina Gong in Treatment: 2 Education Assessment Education Provided To: Patient Education Topics Provided Infection: Methods: Explain/Verbal, Printed Responses: State content correctly Wound/Skin Impairment: Methods: Explain/Verbal, Printed Responses: State content correctly Electronic Signature(s) Signed: 10/07/2021 5:19:40 PM By: Lorrin Jackson Entered By: Lorrin Jackson on 10/07/2021 10:18:37 -------------------------------------------------------------------------------- Wound Assessment Details Patient Name: Date of Service: REA Cristal Deer. 10/07/2021 10:00 A M Medical Record Number: [id number] Patient Account Number: [id number] Date of Birth/Sex: Treating RN: May 07, 1976 (45 y.o. [name] Primary Care Labella Zahradnik: [name] Other Clinician: Referring Nuriyah Hanline: Treating Clarance Bollard/Extender: Cristina Gong in Treatment: 2 Wound Status Wound Number: 1 Primary Etiology: Pressure Ulcer Wound Location: Sacrum Wound Status: Open Wounding Event: Pressure Injury Comorbid Hypertension, Type II Diabetes, Osteomyelitis, History: Neuropathy Date Acquired: 07/22/2021 Weeks Of Treatment: 2 Clustered Wound: No Wound Measurements Length: (cm) 4 Width: (cm) 2.5 Depth: (cm) 2.5 Area: (cm) 7.854 Volume: (cm) 19.635 % Reduction in Area: 44.4% % Reduction in Volume: 44.4% Tunneling: No Undermining: Yes Starting Position (o'clock): 1 Ending Position (o'clock): 6 Maximum Distance: (cm) 2.5 Wound Description Classification: Category/Stage III Wound Margin: Well defined, not attached Exudate Amount: Medium Exudate Type: Serosanguineous Exudate Color: red,  brown Foul Odor After Cleansing: No Slough/Fibrino Yes Wound Bed Granulation Amount: Large (67-100%) Exposed Structure Granulation Quality: Red, Pink Fascia Exposed: No Necrotic Amount: Small (1-33%) Fat Layer (Subcutaneous Tissue) Exposed: Yes Necrotic Quality: Adherent Slough Tendon Exposed: No Muscle Exposed: No Joint Exposed: No Bone Exposed: No Treatment Notes Wound #1 (Sacrum) Cleanser Soap and Water Discharge Instruction: May shower and wash wound with dial antibacterial soap and water prior to dressing change. Wound Cleanser Discharge Instruction: Cleanse the wound with wound cleanser prior to applying a clean dressing using gauze sponges, not tissue or cotton balls. Peri-Wound Care Skin Prep Discharge Instruction: Use skin prep as directed Topical Dakins Solution Primary Dressing Secondary Dressing Woven Gauze Sponge, Non-Sterile 4x4 in Discharge Instruction: Apply Dakins moistened to wound bed, cover with dry gauze Zetuvit Plus Silicone Border Dressing 5x5 (in/in) Discharge Instruction: Apply silicone border over primary dressing as directed. Secured With Compression Wrap Compression Stockings Environmental education officer) Signed: 10/07/2021 4:24:09 PM By: Baruch Gouty RN, BSN Signed: 10/07/2021 5:19:40 PM By: Lorrin Jackson Entered By: Lorrin Jackson on 10/07/2021 10:17:35 -------------------------------------------------------------------------------- Vitals Details Patient Name: Date of Service: [name], Hoyt Koch. 10/07/2021 10:00 A M Medical Record Number: [id number] Patient Account Number: [id number] Date of Birth/Sex: Treating RN: 1976-04-18 (45 y.o. [name] Primary Care Terrie Grajales: [name] Other Clinician: Referring Marcellas Marchant: Treating Binyomin Brann/Extender: Gillie Manners, Janett Billow  Weeks in Treatment: 2 Vital Signs Time Taken: 10:02 Temperature (F): 98.3 Height (in): 71 Pulse (bpm): 73 Weight (lbs):  180 Respiratory Rate (breaths/min): 18 Body Mass Index (BMI): 25.1 Blood Pressure (mmHg): 148/84 Reference Range: 80 - 120 mg / dl Electronic Signature(s) Signed: 10/07/2021 3:58:17 PM By: Sandre Kitty Entered By: Sandre Kitty on 10/07/2021 10:02:57

## 2021-10-08 ENCOUNTER — Other Ambulatory Visit (HOSPITAL_COMMUNITY): Payer: Self-pay

## 2021-10-08 ENCOUNTER — Telehealth: Payer: Self-pay

## 2021-10-08 NOTE — Telephone Encounter (Signed)
Patient's wife called wanting to know why the patient only received 30 tablets of the Pantoprazole at discharge. Informed her that she would have to call the pharmacy to see if they did not have enough tablets to do the entire fill. Informed her that in his chart for the Pantoprazole it does say partial.

## 2021-10-14 ENCOUNTER — Other Ambulatory Visit: Payer: Self-pay

## 2021-10-14 ENCOUNTER — Encounter (HOSPITAL_BASED_OUTPATIENT_CLINIC_OR_DEPARTMENT_OTHER): Payer: 59 | Admitting: Physician Assistant

## 2021-10-14 DIAGNOSIS — L89153 Pressure ulcer of sacral region, stage 3: Secondary | ICD-10-CM | POA: Diagnosis not present

## 2021-10-14 NOTE — Progress Notes (Addendum)
Jose [name] ([id number]) Visit Report for 10/14/2021 Chief Complaint Document Details Patient Name: Date of Service: Jose [name] 10/14/2021 9:45 A M Medical Record Number: [id number] Patient Account Number: [id number] Date of Birth/Sex: Treating RN: 02-19-1976 (45 y.o. [name] Primary Care Provider: [name] Other Clinician: Referring Provider: Treating Provider/Extender: Cristina Gong in Treatment: 3 Information Obtained from: Patient Chief Complaint Sacral abscess with osteomyelitis Electronic Signature(s) Signed: 10/14/2021 10:05:35 AM By: Worthy Keeler PA-C Entered By: Worthy Keeler on 10/14/2021 10:05:35 -------------------------------------------------------------------------------- HPI Details Patient Name: Date of Service: Jose [name]. 10/14/2021 9:45 A M Medical Record Number: [id number] Patient Account Number: [id number] Date of Birth/Sex: Treating RN: 1976/03/09 (45 y.o. [name] Primary Care Provider: [name] Other Clinician: Referring Provider: Treating Provider/Extender: Cristina Gong in Treatment: 3 History of Present Illness HPI Description: 09/23/2021 upon evaluation today patient presents for initial inspection here in the clinic concerning a wound that he has over the sacral region. He unfortunately has been experiencing issues with a pilonidal cyst that opened in September 2022. It was also noted in the end that he also had osteomyelitis in the sacral region. That is quite unfortunate he still on IV antibiotics for that his wife helps to manage this at home. This is a stage III pressure ulcer. Subsequently the patient also has diabetes mellitus type 2, generalized weakness, and again the history of pilonidal cyst in this area with abscess. He does have chronic kidney disease stage III. His most recent hemoglobin A1c was 7.1 at last check. He does not have  home health his wife is the one performing all the dressing changes currently they have been doing Santyl with Dakin's moistened gauze behind it 2 times a day. 10/07/2021 upon evaluation today patient appears to be doing well with regard to his wound in general. He has been tolerating the dressing changes without complication. In fact the Dakin's moistened gauze has been doing excellent I think based on what I am seeing and overall I am extremely pleased with where we stand I think he is in a good spot to begin with the wound VAC to be honest. 10/14/2021 upon evaluation today patient appears to be doing better in regard to his wound I am actually seeing signs of good improvement which is great news. Fortunately I do not see any evidence of active infection locally nor systemically at this time which is also great news. No fevers, chills, nausea, vomiting, or diarrhea. Electronic Signature(s) Signed: 10/14/2021 11:32:14 AM By: Worthy Keeler PA-C Entered By: Worthy Keeler on 10/14/2021 11:32:13 -------------------------------------------------------------------------------- Physical Exam Details Patient Name: Date of Service: Jose V IS, [name] 10/14/2021 9:45 A M Medical Record Number: [id number] Patient Account Number: [id number] Date of Birth/Sex: Treating RN: 11-27-1975 (45 y.o. [name] Primary Care Provider: [name] Other Clinician: Referring Provider: Treating Provider/Extender: Cristina Gong in Treatment: 3 Constitutional Well-nourished and well-hydrated in no acute distress. Respiratory normal breathing without difficulty. Psychiatric this patient is able to make decisions and demonstrates good insight into disease process. Alert and Oriented x 3. pleasant and cooperative. Notes Upon inspection patient's wound bed showed again good granulation no sharp debridement was necessary. He does have a wound VAC which I think we can start  putting on beginning next Tuesday. I do not want to put it on today as he have to have it on for the very  first time for a total of 6 days I feel like that is a little bit long to be honest. Nonetheless I will go ahead and see about getting things started for next Tuesday who have several nurse visits and then I will see him on January 11 that is the plan. Electronic Signature(s) Signed: 10/14/2021 11:32:43 AM By: Worthy Keeler PA-C Entered By: Worthy Keeler on 10/14/2021 11:32:42 -------------------------------------------------------------------------------- Physician Orders Details Patient Name: Date of Service: Jose V IS, [name] 10/14/2021 9:45 A M Medical Record Number: [id number] Patient Account Number: [id number] Date of Birth/Sex: Treating RN: 31-Dec-1975 (45 y.o. [name] Primary Care Provider: [name] Other Clinician: Referring Provider: Treating Provider/Extender: Cristina Gong in Treatment: 3 Verbal / Phone Orders: No Diagnosis Coding ICD-10 Coding Code Description L89.153 Pressure ulcer of sacral region, stage 3 M46.28 Osteomyelitis of vertebra, sacral and sacrococcygeal region E11.622 Type 2 diabetes mellitus with other skin ulcer M62.81 Muscle weakness (generalized) L05.01 Pilonidal cyst with abscess N18.30 Chronic kidney disease, stage 3 unspecified Follow-up Appointments Return appointment in 3 weeks. Margarita Grizzle 11/04/2021 **Continue to use the current wound care dressing until Tuesday with wound vac application. **** Nurse Visit: - Tuesday 10/20/2021, Friday 10/23/2021 Tuesday 10/27/2021, Friday 10/30/2021 Other: Kyung Rudd = Supplies Bathing/ Shower/ Hygiene May shower and wash wound with soap and water. - when changing dressing Negative Presssure Wound Therapy Wound Vac to wound continuously at 133mm/hg pressure - *****Tuesday nurse visit 10/20/2021 to initiate the wound vac.**** Black Foam - May use Duoderm to help keep  seal Off-Loading Roho cushion for wheelchair Turn and reposition every 2 hours Additional Orders / Instructions Follow Nutritious Diet - High Protein Diet Wound Treatment Wound #1 - Sacrum Cleanser: Soap and Water 2 x Per Day/30 Days Discharge Instructions: May shower and wash wound with dial antibacterial soap and water prior to dressing change. Cleanser: Wound Cleanser 2 x Per Day/30 Days Discharge Instructions: Cleanse the wound with wound cleanser prior to applying a clean dressing using gauze sponges, not tissue or cotton balls. Peri-Wound Care: Skin Prep (Generic) 2 x Per Day/30 Days Discharge Instructions: Use skin prep as directed Topical: Dakins Solution 2 x Per Day/30 Days Secondary Dressing: Woven Gauze Sponge, Non-Sterile 4x4 in (Generic) 2 x Per Day/30 Days Discharge Instructions: Apply Dakins moistened to wound bed, cover with dry gauze Secondary Dressing: Zetuvit Plus Silicone Border Dressing 5x5 (in/in) (Generic) 2 x Per Day/30 Days Discharge Instructions: Apply silicone border over primary dressing as directed. Electronic Signature(s) Signed: 10/14/2021 5:18:31 PM By: Worthy Keeler PA-C Signed: 10/14/2021 6:10:28 PM By: Deon Pilling RN, BSN Entered By: Deon Pilling on 10/14/2021 10:51:59 -------------------------------------------------------------------------------- Problem List Details Patient Name: Date of Service: Jose V IS, Lofton Leon [name]. 10/14/2021 9:45 A M Medical Record Number: [id number] Patient Account Number: [id number] Date of Birth/Sex: Treating RN: June 30, 1976 (45 y.o. [name] Primary Care Provider: [name] Other Clinician: Referring Provider: Treating Provider/Extender: Cristina Gong in Treatment: 3 Active Problems ICD-10 Encounter Code Description Active Date MDM Diagnosis L89.153 Pressure ulcer of sacral region, stage 3 09/23/2021 No Yes M46.28 Osteomyelitis of vertebra, sacral and sacrococcygeal  region 09/23/2021 No Yes E11.622 Type 2 diabetes mellitus with other skin ulcer 09/23/2021 No Yes M62.81 Muscle weakness (generalized) 09/23/2021 No Yes L05.01 Pilonidal cyst with abscess 09/23/2021 No Yes N18.30 Chronic kidney disease, stage 3 unspecified 09/23/2021 No Yes Inactive Problems Resolved Problems Electronic Signature(s) Signed: 10/14/2021 10:05:22 AM By: Worthy Keeler PA-C Entered  By: Worthy Keeler on 10/14/2021 10:05:22 -------------------------------------------------------------------------------- Progress Note Details Patient Name: Date of Service: Jose ALIE, [place] 10/14/2021 9:45 A M Medical Record Number: [id number] Patient Account Number: [id number] Date of Birth/Sex: Treating RN: Jul 29, 1976 (45 y.o. [name] Primary Care Provider: [name] Other Clinician: Referring Provider: Treating Provider/Extender: Cristina Gong in Treatment: 3 Subjective Chief Complaint Information obtained from Patient Sacral abscess with osteomyelitis History of Present Illness (HPI) 09/23/2021 upon evaluation today patient presents for initial inspection here in the clinic concerning a wound that he has over the sacral region. He unfortunately has been experiencing issues with a pilonidal cyst that opened in September 2022. It was also noted in the end that he also had osteomyelitis in the sacral region. That is quite unfortunate he still on IV antibiotics for that his wife helps to manage this at home. This is a stage III pressure ulcer. Subsequently the patient also has diabetes mellitus type 2, generalized weakness, and again the history of pilonidal cyst in this area with abscess. He does have chronic kidney disease stage III. His most recent hemoglobin A1c was 7.1 at last check. He does not have home health his wife is the one performing all the dressing changes currently they have been doing Santyl with Dakin's moistened gauze behind it  2 times a day. 10/07/2021 upon evaluation today patient appears to be doing well with regard to his wound in general. He has been tolerating the dressing changes without complication. In fact the Dakin's moistened gauze has been doing excellent I think based on what I am seeing and overall I am extremely pleased with where we stand I think he is in a good spot to begin with the wound VAC to be honest. 10/14/2021 upon evaluation today patient appears to be doing better in regard to his wound I am actually seeing signs of good improvement which is great news. Fortunately I do not see any evidence of active infection locally nor systemically at this time which is also great news. No fevers, chills, nausea, vomiting, or diarrhea. Objective Constitutional Well-nourished and well-hydrated in no acute distress. Vitals Time Taken: 10:15 AM, Height: 71 in, Weight: 180 lbs, BMI: 25.1, Temperature: 98.3 F, Pulse: 80 bpm, Respiratory Rate: 18 breaths/min, Blood Pressure: 175/84 mmHg. Respiratory normal breathing without difficulty. Psychiatric this patient is able to make decisions and demonstrates good insight into disease process. Alert and Oriented x 3. pleasant and cooperative. General Notes: Upon inspection patient's wound bed showed again good granulation no sharp debridement was necessary. He does have a wound VAC which I think we can start putting on beginning next Tuesday. I do not want to put it on today as he have to have it on for the very first time for a total of 6 days I feel like that is a little bit long to be honest. Nonetheless I will go ahead and see about getting things started for next Tuesday who have several nurse visits and then I will see him on January 11 that is the plan. Integumentary (Hair, Skin) Wound #1 status is Open. Original cause of wound was Pressure Injury. The date acquired was: 07/22/2021. The wound has been in treatment 3 weeks. The wound is located on the Sacrum.  The wound measures 3.7cm length x 2.3cm width x 2.5cm depth; 6.684cm^2 area and 16.709cm^3 volume. There is Fat Layer (Subcutaneous Tissue) exposed. There is no tunneling or undermining noted. There is a medium amount of  serosanguineous drainage noted. The wound margin is well defined and not attached to the wound base. There is large (67-100%) red, pink granulation within the wound bed. There is a small (1-33%) amount of necrotic tissue within the wound bed including Adherent Slough. Assessment Active Problems ICD-10 Pressure ulcer of sacral region, stage 3 Osteomyelitis of vertebra, sacral and sacrococcygeal region Type 2 diabetes mellitus with other skin ulcer Muscle weakness (generalized) Pilonidal cyst with abscess Chronic kidney disease, stage 3 unspecified Plan Follow-up Appointments: Return appointment in 3 weeks. Margarita Grizzle 11/04/2021 **Continue to use the current wound care dressing until Tuesday with wound vac application. **** Nurse Visit: - Tuesday 10/20/2021, Friday 10/23/2021 Tuesday 10/27/2021, Friday 10/30/2021 Other: Kyung Rudd = Supplies Bathing/ Shower/ Hygiene: May shower and wash wound with soap and water. - when changing dressing Negative Presssure Wound Therapy: Wound Vac to wound continuously at 187mm/hg pressure - *****Tuesday nurse visit 10/20/2021 to initiate the wound vac.**** Black Foam - May use Duoderm to help keep seal Off-Loading: Roho cushion for wheelchair Turn and reposition every 2 hours Additional Orders / Instructions: Follow Nutritious Diet - High Protein Diet WOUND #1: - Sacrum Wound Laterality: Cleanser: Soap and Water 2 x Per Day/30 Days Discharge Instructions: May shower and wash wound with dial antibacterial soap and water prior to dressing change. Cleanser: Wound Cleanser 2 x Per Day/30 Days Discharge Instructions: Cleanse the wound with wound cleanser prior to applying a clean dressing using gauze sponges, not tissue or cotton  balls. Peri-Wound Care: Skin Prep (Generic) 2 x Per Day/30 Days Discharge Instructions: Use skin prep as directed Topical: Dakins Solution 2 x Per Day/30 Days Secondary Dressing: Woven Gauze Sponge, Non-Sterile 4x4 in (Generic) 2 x Per Day/30 Days Discharge Instructions: Apply Dakins moistened to wound bed, cover with dry gauze Secondary Dressing: Zetuvit Plus Silicone Border Dressing 5x5 (in/in) (Generic) 2 x Per Day/30 Days Discharge Instructions: Apply silicone border over primary dressing as directed. 1. I would recommend currently that we going continue with the wound care measures as before with regard to the Santyl and saline moistened gauze packing for the time being. 2. I am also can recommend that we get the wound VAC started next week obviously I think that this will do a good job and I will see him in about 3 weeks to see where things stand but nonetheless I feel like that the wound VAC is good to be the appropriate way to go. We will see patient back for reevaluation in 1 week here in the clinic. If anything worsens or changes patient will contact our office for additional recommendations. Electronic Signature(s) Signed: 10/14/2021 11:33:10 AM By: Worthy Keeler PA-C Entered By: Worthy Keeler on 10/14/2021 11:33:09 -------------------------------------------------------------------------------- SuperBill Details Patient Name: Date of Service: Jose V IS, Kyannah Climer [name]. 10/14/2021 Medical Record Number: [id number] Patient Account Number: [id number] Date of Birth/Sex: Treating RN: [birth year] (45 y.o. [name] Primary Care Provider: [name] Other Clinician: Referring Provider: Treating Provider/Extender: Cristina Gong in Treatment: 3 Diagnosis Coding ICD-10 Codes Code Description [phone] Pressure ulcer of sacral region, stage 3 M46.28 Osteomyelitis of vertebra, sacral and sacrococcygeal region E11.622 Type 2 diabetes mellitus with  other skin ulcer M62.81 Muscle weakness (generalized) L05.01 Pilonidal cyst with abscess N18.30 Chronic kidney disease, stage 3 unspecified Facility Procedures CPT4 Code: [id number] Description: 99213 - WOUND CARE VISIT-LEV 3 EST PT Modifier: Quantity: 1 Physician Procedures : CPT4 Code Description Modifier [id number] - WC PHYS LEVEL 4 - EST  PT ICD-10 Diagnosis Description L89.153 Pressure ulcer of sacral region, stage 3 M46.28 Osteomyelitis of vertebra, sacral and sacrococcygeal region E11.622 Type 2 diabetes mellitus  with other skin ulcer M62.81 Muscle weakness (generalized) Quantity: 1 Electronic Signature(s) Signed: 10/14/2021 5:18:31 PM By: Worthy Keeler PA-C Signed: 10/14/2021 6:10:28 PM By: Deon Pilling RN, BSN Previous Signature: 10/14/2021 11:33:27 AM Version By: Worthy Keeler PA-C Entered By: Deon Pilling on 10/14/2021 15:20:08

## 2021-10-15 NOTE — Progress Notes (Signed)
Jose [name] ([id number]) Visit Report for 10/14/2021 Arrival Information Details Patient Name: Date of Service: Jose [name], Jose [name] 10/14/2021 9:45 A M Medical Record Number: [id number] Patient Account Number: [id number] Date of Birth/Sex: Treating RN: Jose [name] (45 y.o. Jose [name] Primary Care Jose [name]: Jose [name] Other Clinician: Referring Jose [name]: Treating Jose [name]/Extender: Jose [name] in Treatment: 3 Visit Information History Since Last Visit Added or deleted any medications: No Patient Arrived: Wheel Chair Any new allergies or adverse reactions: No Arrival Time: 10:14 Had a fall or experienced change in No Accompanied By: Girlfriend activities of daily living that may affect Transfer Assistance: Manual risk of falls: Patient Identification Verified: Yes Signs or symptoms of abuse/neglect since last visito No Secondary Verification Process Completed: Yes Hospitalized since last visit: No Patient Requires Transmission-Based Precautions: No Implantable device outside of the clinic excluding No Patient Has Alerts: No cellular tissue based products placed in the center since last visit: Has Dressing in Place as Prescribed: Yes Pain Present Now: Yes Electronic Signature(s) Signed: 10/15/2021 9:32:36 AM By: Jose [name] Entered By: Jose [name] on 10/14/2021 10:15:05 -------------------------------------------------------------------------------- Clinic Level of Care Assessment Details Patient Name: Date of Service: Jose [name], Jose [name] 10/14/2021 9:45 A M Medical Record Number: [id number] Patient Account Number: [id number] Date of Birth/Sex: Treating RN: [birth year] (45 y.o. Jose [name] Primary Care Dynisha Due: Jose [name] Other Clinician: Referring Crystalle Popwell: Treating Jailin Manocchio/Extender: Jose [name] in Treatment: 3 Clinic Level of Care Assessment Items TOOL 4 Quantity Score X- 1 0 Use  when only an EandM [name] performed on FOLLOW-UP visit ASSESSMENTS - Nursing Assessment / Reassessment X- 1 10 Reassessment of Co-morbidities (includes updates in patient status) X- 1 5 Reassessment of Adherence to Treatment Plan ASSESSMENTS - Wound and Skin A ssessment / Reassessment X - Simple Wound Assessment / Reassessment - one wound 1 5 []  - 0 Complex Wound Assessment / Reassessment - multiple wounds X- 1 10 Dermatologic / Skin Assessment (not related to wound area) ASSESSMENTS - Focused Assessment []  - 0 Circumferential Edema Measurements - multi extremities X- 1 10 Nutritional Assessment / Counseling / Intervention []  - 0 Lower Extremity Assessment (monofilament, tuning fork, pulses) []  - 0 Peripheral Arterial Disease Assessment (using hand held doppler) ASSESSMENTS - Ostomy and/or Continence Assessment and Care []  - 0 Incontinence Assessment and Management []  - 0 Ostomy Care Assessment and Management (repouching, etc.) PROCESS - Coordination of Care X - Simple Patient / Family Education for ongoing care 1 15 []  - 0 Complex (extensive) Patient / Family Education for ongoing care X- 1 10 Staff obtains Consents, Records, T Results / Process Orders est []  - 0 Staff telephones HHA, Nursing Homes / Clarify orders / etc []  - 0 Routine Transfer to another Facility (non-emergent condition) []  - 0 Routine Hospital Admission (non-emergent condition) []  - 0 New Admissions / Biomedical engineer / Ordering NPWT Apligraf, etc. , []  - 0 Emergency Hospital Admission (emergent condition) X- 1 10 Simple Discharge Coordination []  - 0 Complex (extensive) Discharge Coordination PROCESS - Special Needs []  - 0 Pediatric / Minor Patient Management []  - 0 Isolation Patient Management []  - 0 Hearing / Language / Visual special needs []  - 0 Assessment of Community assistance (transportation, D/C planning, etc.) []  - 0 Additional assistance / Altered mentation []  - 0 Support  Surface(s) Assessment (bed, cushion, seat, etc.) INTERVENTIONS - Wound Cleansing / Measurement X - Simple Wound Cleansing - one wound 1 5 []  - 0 Complex  Wound Cleansing - multiple wounds X- 1 5 Wound Imaging (photographs - any number of wounds) []  - 0 Wound Tracing (instead of photographs) X- 1 5 Simple Wound Measurement - one wound []  - 0 Complex Wound Measurement - multiple wounds INTERVENTIONS - Wound Dressings X - Small Wound Dressing one or multiple wounds 1 10 []  - 0 Medium Wound Dressing one or multiple wounds []  - 0 Large Wound Dressing one or multiple wounds []  - 0 Application of Medications - topical []  - 0 Application of Medications - injection INTERVENTIONS - Miscellaneous []  - 0 External ear exam []  - 0 Specimen Collection (cultures, biopsies, blood, body fluids, etc.) []  - 0 Specimen(s) / Culture(s) sent or taken to Lab for analysis []  - 0 Patient Transfer (multiple staff / Civil Service fast streamer / Similar devices) []  - 0 Simple Staple / Suture removal (25 or less) []  - 0 Complex Staple / Suture removal (26 or more) []  - 0 Hypo / Hyperglycemic Management (close monitor of Blood Glucose) []  - 0 Ankle / Brachial Index (ABI) - do not check if billed separately X- 1 5 Vital Signs Has the patient been seen at the hospital within the last three years: Yes Total Score: 105 Level Of Care: New/Established - Level 3 Electronic Signature(s) Signed: 10/14/2021 6:10:28 PM By: Deon Pilling RN, BSN Entered By: Deon Pilling on 10/14/2021 15:19:52 -------------------------------------------------------------------------------- Encounter Discharge Information Details Patient Name: Date of Service: Jose Cristal Deer. 10/14/2021 9:45 A M Medical Record Number: [id number] Patient Account Number: [id number] Date of Birth/Sex: Treating RN: [birth year] (45 y.o. Jose [name] Primary Care Laila Myhre: Jose [name] Other Clinician: Referring Kendall Arnell: Treating Layal Javid/Extender:  Jose [name] in Treatment: 3 Encounter Discharge Information Items Discharge Condition: Stable Ambulatory Status: Wheelchair Discharge Destination: Home Transportation: Private Auto Accompanied By: family member Schedule Follow-up Appointment: Yes Clinical Summary of Care: Electronic Signature(s) Signed: 10/14/2021 6:10:28 PM By: Deon Pilling RN, BSN Entered By: Deon Pilling on 10/14/2021 15:20:46 -------------------------------------------------------------------------------- Lower Extremity Assessment Details Patient Name: Date of Service: Jose [name], Jose [name] 10/14/2021 9:45 A M Medical Record Number: [id number] Patient Account Number: [id number] Date of Birth/Sex: Treating RN: [birth year] (45 y.o. Jose [name] Primary Care Benjamyn Hestand: Jose [name] Other Clinician: Referring Sherill Wegener: Treating Mikel Pyon/Extender: Jose [name] in Treatment: 3 Electronic Signature(s) Signed: 10/14/2021 6:10:28 PM By: Deon Pilling RN, BSN Entered By: Deon Pilling on 10/14/2021 10:16:16 -------------------------------------------------------------------------------- Multi-Disciplinary Care Plan Details Patient Name: Date of Service: Jose [name], Jose Koch. 10/14/2021 9:45 A M Medical Record Number: [id number] Patient Account Number: [id number] Date of Birth/Sex: Treating RN: [name] (45 y.o. Jose [name] Primary Care Kalim Kissel: Jose [name] Other Clinician: Referring Koi Yarbro: Treating Calab Sachse/Extender: Jose [name] in Treatment: 3 Active Inactive Osteomyelitis Nursing Diagnoses: Infection: osteomyelitis Goals: Patient/caregiver will verbalize understanding of disease process and disease management Date Initiated: 09/23/2021 Target Resolution Date: 11/12/2021 Goal Status: Active Interventions: Assess for signs and symptoms of osteomyelitis resolution every visit Provide education on  osteomyelitis Notes: Wound/Skin Impairment Nursing Diagnoses: Impaired tissue integrity Goals: Patient/caregiver will verbalize understanding of skin care regimen Date Initiated: 09/23/2021 Target Resolution Date: 11/11/2021 Goal Status: Active Ulcer/skin breakdown will have a volume reduction of 30% by week 4 Date Initiated: 09/23/2021 Target Resolution Date: 10/21/2021 Goal Status: Active Interventions: Assess patient/caregiver ability to obtain necessary supplies Assess patient/caregiver ability to perform ulcer/skin care regimen upon admission and as needed Assess ulceration(s) every visit Provide education on ulcer and skin care Treatment  Activities: Topical wound management initiated : 09/23/2021 Notes: Electronic Signature(s) Signed: 10/14/2021 6:10:28 PM By: Deon Pilling RN, BSN Entered By: Deon Pilling on 10/14/2021 15:18:54 -------------------------------------------------------------------------------- Pain Assessment Details Patient Name: Date of Service: Jose Cristal Deer. 10/14/2021 9:45 A M Medical Record Number: [id number] Patient Account Number: [id number] Date of Birth/Sex: Treating RN: [name] (45 y.o. Jose [name] Primary Care Bette Brienza: Jose [name] Other Clinician: Referring Jaceyon Strole: Treating Essence Merle/Extender: Jose [name] in Treatment: 3 Active Problems Location of Pain Severity and Description of Pain Patient Has Paino Yes Site Locations Pain Location: Pain in Ulcers Rate the pain. Current Pain Level: 2 Pain Management and Medication Current Pain Management: Medication: No Cold Application: No Rest: No Massage: No Activity: No T.E.N.S.: No Heat Application: No Leg drop or elevation: No [name] the Current Pain Management Adequate: Adequate How does your wound impact your activities of daily livingo Sleep: No Bathing: No Appetite: No Relationship With Others: No Bladder Continence: No Emotions:  No Bowel Continence: No Work: No Toileting: No Drive: No Dressing: No Hobbies: No Engineer, maintenance) Signed: 10/14/2021 6:10:28 PM By: Deon Pilling RN, BSN Entered By: Deon Pilling on 10/14/2021 10:16:11 -------------------------------------------------------------------------------- Patient/Caregiver Education Details Patient Name: Date of Service: Jose Cristal Deer 12/21/2022andnbsp9:45 A M Medical Record Number: [id number] Patient Account Number: [id number] Date of Birth/Gender: Treating RN: [name] (45 y.o. Jose [name] Primary Care Physician: Jose [name] Other Clinician: Referring Physician: Treating Physician/Extender: Jose [name] in Treatment: 3 Education Assessment Education Provided To: Patient and Caregiver Education Topics Provided Wound/Skin Impairment: Handouts: Skin Care Do's and Dont's Methods: Explain/Verbal Responses: Reinforcements needed Electronic Signature(s) Signed: 10/14/2021 6:10:28 PM By: Deon Pilling RN, BSN Entered By: Deon Pilling on 10/14/2021 15:19:14 -------------------------------------------------------------------------------- Wound Assessment Details Patient Name: Date of Service: Jose Cristal Deer. 10/14/2021 9:45 A M Medical Record Number: [id number] Patient Account Number: [id number] Date of Birth/Sex: Treating RN: Jose [birth year], [name] (45 y.o. Jose [name] Primary Care Micheal Sheen: Jose [name] Other Clinician: Referring Vedansh Kerstetter: Treating Solange Emry/Extender: Jose [name] in Treatment: 3 Wound Status Wound Number: 1 Primary Etiology: Pressure Ulcer Wound Location: Sacrum Wound Status: Open Wounding Event: Pressure Injury Comorbid Hypertension, Type II Diabetes, Osteomyelitis, History: Neuropathy Date Acquired: 07/22/2021 Weeks Of Treatment: 3 Clustered Wound: No Photos Wound Measurements Length: (cm) 3.7 Width: (cm) 2.3 Depth: (cm) 2.5 Area: (cm)  6.684 Volume: (cm) 16.709 % Reduction in Area: 52.7% % Reduction in Volume: 52.7% Epithelialization: Small (1-33%) Tunneling: No Undermining: No Wound Description Classification: Category/Stage III Wound Margin: Well defined, not attached Exudate Amount: Medium Exudate Type: Serosanguineous Exudate Color: red, brown Foul Odor After Cleansing: No Slough/Fibrino Yes Wound Bed Granulation Amount: Large (67-100%) Exposed Structure Granulation Quality: Red, Pink Fascia Exposed: No Necrotic Amount: Small (1-33%) Fat Layer (Subcutaneous Tissue) Exposed: Yes Necrotic Quality: Adherent Slough Tendon Exposed: No Muscle Exposed: No Joint Exposed: No Bone Exposed: No Treatment Notes Wound #1 (Sacrum) Cleanser Soap and Water Discharge Instruction: May shower and wash wound with dial antibacterial soap and water prior to dressing change. Wound Cleanser Discharge Instruction: Cleanse the wound with wound cleanser prior to applying a clean dressing using gauze sponges, not tissue or cotton balls. Peri-Wound Care Skin Prep Discharge Instruction: Use skin prep as directed Topical Dakins Solution Primary Dressing Secondary Dressing Woven Gauze Sponge, Non-Sterile 4x4 in Discharge Instruction: Apply Dakins moistened to wound bed, cover with dry gauze Zetuvit Plus Silicone Border Dressing 5x5 (in/in) Discharge Instruction: Apply silicone border over primary  dressing as directed. Secured With Compression Wrap Compression Stockings Environmental education officer) Signed: 10/14/2021 6:08:54 PM By: Baruch Gouty RN, BSN Signed: 10/14/2021 6:10:28 PM By: Deon Pilling RN, BSN Entered By: Deon Pilling on 10/14/2021 10:16:44 -------------------------------------------------------------------------------- Vitals Details Patient Name: Date of Service: Jose [name], Jose Koch. 10/14/2021 9:45 A M Medical Record Number: [id number] Patient Account Number: [id number] Date of  Birth/Sex: Treating RN: [birth year] [name] (46 y.o. Jose [name] Primary Care Koda Routon: Jose [name] Other Clinician: Referring Bracen Schum: Treating Raahi Korber/Extender: Jose [name] in Treatment: 3 Vital Signs Time Taken: 10:15 Temperature (F): 98.3 Height (in): 71 Pulse (bpm): 80 Weight (lbs): 180 Respiratory Rate (breaths/min): 18 Body Mass Index (BMI): 25.1 Blood Pressure (mmHg): 175/84 Reference Range: 80 - 120 mg / dl Electronic Signature(s) Signed: 10/15/2021 9:32:36 AM By: Jose [name] Entered By: Jose [name] on 10/14/2021 10:15:28

## 2021-10-20 ENCOUNTER — Encounter: Payer: Self-pay | Admitting: Registered Nurse

## 2021-10-20 ENCOUNTER — Encounter (HOSPITAL_BASED_OUTPATIENT_CLINIC_OR_DEPARTMENT_OTHER): Payer: 59 | Admitting: Internal Medicine

## 2021-10-20 ENCOUNTER — Other Ambulatory Visit: Payer: Self-pay

## 2021-10-20 ENCOUNTER — Encounter: Payer: 59 | Attending: Registered Nurse | Admitting: Registered Nurse

## 2021-10-20 VITALS — BP 174/89 | HR 73 | Temp 98.5°F | Ht 71.0 in

## 2021-10-20 DIAGNOSIS — I10 Essential (primary) hypertension: Secondary | ICD-10-CM | POA: Diagnosis not present

## 2021-10-20 DIAGNOSIS — M4714 Other spondylosis with myelopathy, thoracic region: Secondary | ICD-10-CM | POA: Diagnosis not present

## 2021-10-20 DIAGNOSIS — M4624 Osteomyelitis of vertebra, thoracic region: Secondary | ICD-10-CM

## 2021-10-20 DIAGNOSIS — B9561 Methicillin susceptible Staphylococcus aureus infection as the cause of diseases classified elsewhere: Secondary | ICD-10-CM | POA: Diagnosis present

## 2021-10-20 DIAGNOSIS — R7881 Bacteremia: Secondary | ICD-10-CM | POA: Insufficient documentation

## 2021-10-20 DIAGNOSIS — L89153 Pressure ulcer of sacral region, stage 3: Secondary | ICD-10-CM | POA: Diagnosis not present

## 2021-10-20 MED ORDER — MORPHINE SULFATE ER 15 MG PO TBCR: 15.0000 mg | EXTENDED_RELEASE_TABLET | Freq: Two times a day (BID) | ORAL | 0 refills | Status: DC

## 2021-10-20 NOTE — Patient Instructions (Signed)
Dr. Kathyrn Sheriff : [facility] [phone]  [phone]

## 2021-10-20 NOTE — Progress Notes (Signed)
Subjective:    Patient ID: Jose [name], male    DOB: 01-29-1976, 45 y.o.   MRN: [id number]  HPI: Jose [name] is a 45 y.o. male who returns for HFU appointment of his Thoracic Spondylosis with Myelopathy, MSSA Bacteremia, Osteomyelitis of Thoracic Region  and Essential Hypertension. On 08/07/2021  to [facility] for worsening lower back pain.  Chief Complaint: worsening back pain.   Dr. Rogers Blocker H&P: On 08/07/2021 HPI: Jose [name] is a 45 y.o. male with medical history significant of Q0HK with complications of retinopathy, neuropathy, HTN, CKD stage 3, HLD, GAD, anemia and recent hospitalization for MSSA bacteremia and osteomyelitis of his thoracic spine in T5 and T6. He was discharged home with antibiotics until September 04, 2021. He states since his discharge home on Monday he has continued to get worse. He states the pain in his back has just gotten progressively worse and he was unable to walk or sit up this AM. He states the pain is mainly between his shoulder blades, worse on the right. Movement makes it worse. Pain is rated as an 8/10 and described as sharp and piercing pain. He was given oral pain pills, but these have not touched the pain recently.    He has had no fever, but has had chills at home. He has not had a BM since Saturday and is taking stool softners and lactulose. He has had no problems urinating. He has no loss of sensation around his buttocks and no urinary incontinence.    He has a headache. Denies any chest pain, palpitations, stomach pain, N/V/D, leg swelling. He has no weakness in his legs or arms. He has shortness of breath when pain is severe, but no cough.   Neurosurgery, ID  and Gastroenterology was consulted.   MR: Thoracic Spine:  IMPRESSION: 1. Redemonstrated abnormal bone marrow signal and enhancement involving the T4 and T5 levels with increased surrounding paraspinal phlegmon and new epidural phlegmon (6 mm thick ventrally). Interval T5 pathologic  fracture (50-60% height loss) with new 4 mm of bony retropulsion. Constellation of findings results in new moderate to severe left eccentric canal stenosis at T4 with cord deformity. 2. Overall, findings are most likely secondary to progressive discitis/osteomyelitis. Superimposed osseous metastatic disease or multiple myeloma is not excluded by imaging. Recommend correlation with the results of reported recent biopsy. 3. Multiple small (subcentimeter) fluid collections in the left posterior paraspinal phlegmon could represent tiny abscesses.  CT Abdomen and Pelvis:  IMPRESSION: 1. Generally large burden of stool and stool balls throughout the colon. 2. Distended urinary bladder, measuring up to 19.0 cm. Correlate for urinary retention. 3. Anasarca.  Jose [name] underwent: THORACIC FOUR - THORACIC FIVE LAMINECTOMY/DECOMPRESSION OF SPINAL CORD, DEBRIDEMENT OF ABSCESS, MICRODISCECTOMY, INTRAOPERATIVE ULTRASOUNDby Dr Kathyrn Sheriff on 08/07/2021   He was admitted to inpatient rehabilitation on 08/30/2021 and discharged home on 09/21/2021. He is awaiting on [facility]. He states he has pain in his Right Shoulder and Lower Back Pain. He rates his pain 5. He is walking with walker.   Wound Care Placed Wound Vac today, he reports.        Pain Inventory Average Pain 3 Pain Right Now 5 My pain is constant, dull, and aching  LOCATION OF PAIN  base of spine, right shoulder blade  BOWEL Number of stools per week: 7 Oral laxative use Yes  Type of laxative Senokot Enema or suppository use No  History of colostomy No  Incontinent No  BLADDER Normal  Mobility walk without assistance use a walker ability to climb steps?  yes do you drive?  no use a wheelchair transfers alone Do you have any goals in this area?  yes  Function employed # of hrs/week on medical leave  I need assistance with the following:  meal prep, household duties, and shopping Do you have any  goals in this area?  yes  Neuro/Psych weakness trouble walking spasms depression anxiety  Prior Studies Any changes since last visit?  no  Physicians involved in your care    Family History  Problem Relation Age of Onset   Diabetes Mother    Hyperlipidemia Mother    Stroke Mother    Diabetes Father    Hyperlipidemia Brother    Stroke Brother    ADD / ADHD Brother    ADD / ADHD Son    Social History   Socioeconomic History   Marital status: Significant Other    Spouse name: Not on file   Number of children: 2   Years of education: Not on file   Highest education level: Bachelor's degree (e.g., BA, AB, BS)  Occupational History   Occupation: Multimedia programmer: [employer]  Tobacco Use   Smoking status: Former    Packs/day: 1.00    Years: 20.00    Pack years: 20.00    Types: Cigarettes    Quit date: 07/2020    Years since quitting: 1.2   Smokeless tobacco: Never  Vaping Use   Vaping Use: Never used  Substance and Sexual Activity   Alcohol use: Not Currently   Drug use: Never   Sexual activity: Yes    Partners: Female    Birth control/protection: None    Comment: with monogamous partner  Other Topics Concern   Not on file  Social History Narrative   Pt has lived 54 of life in [place]. Lives at home with partner, 2 kids, 8 cats, and 1 dog.    Social Determinants of Health   Financial Resource Strain: Not on file  Food Insecurity: Not on file  Transportation Needs: Not on file  Physical Activity: Not on file  Stress: Not on file  Social Connections: Not on file   Past Surgical History:  Procedure Laterality Date   APPENDECTOMY     BIOPSY  08/10/2021   Procedure: BIOPSY;  Surgeon: Otis Brace, MD;  Location: [place];  Service: Gastroenterology;;   BUBBLE STUDY  07/29/2021   Procedure: BUBBLE STUDY;  Surgeon: Jerline Pain, MD;  Location: [facility] ENDOSCOPY;  Service: Cardiovascular;;    ESOPHAGOGASTRODUODENOSCOPY (EGD) WITH PROPOFOL N/A 08/10/2021   Procedure: ESOPHAGOGASTRODUODENOSCOPY (EGD) WITH PROPOFOL;  Surgeon: Otis Brace, MD;  Location: [place];  Service: Gastroenterology;  Laterality: N/A;   ESOPHAGOGASTRODUODENOSCOPY (EGD) WITH PROPOFOL N/A 08/24/2021   Procedure: ESOPHAGOGASTRODUODENOSCOPY (EGD) WITH PROPOFOL;  Surgeon: Otis Brace, MD;  Location: [place];  Service: Gastroenterology;  Laterality: N/A;   HERNIA REPAIR     IR FLUORO GUIDED NEEDLE PLC ASPIRATION/INJECTION LOC  07/28/2021   LUMBAR LAMINECTOMY/DECOMPRESSION MICRODISCECTOMY N/A 08/07/2021   Procedure: THORACIC FOUR - THORACIC FIVE LAMINECTOMY/DECOMPRESSION OF SPINAL CORD, DEBRIDEMENT OF ABSCESS, MICRODISCECTOMY, INTRAOPERATIVE ULTRASOUND;  Surgeon: Consuella Lose, MD;  Location: [place];  Service: Neurosurgery;  Laterality: N/A;   TEE WITHOUT CARDIOVERSION N/A 07/29/2021   Procedure: TRANSESOPHAGEAL ECHOCARDIOGRAM (TEE);  Surgeon: Jerline Pain, MD;  Location: [facility] ENDOSCOPY;  Service: Cardiovascular;  Laterality: N/A;   TRIGGER FINGER RELEASE Right 10/25/2019  Procedure: RIGHT INDEX FINGER RELEASE TRIGGER FINGER/A-1 PULLEY;  Surgeon: Daryll Brod, MD;  Location: [place];  Service: Orthopedics;  Laterality: Right;  IV REGIONAL FOREARM BLOCK   Past Medical History:  Diagnosis Date   AKI (acute kidney injury) ([place])    GAD (generalized anxiety disorder)    Hyperlipidemia    Macular degeneration, bilateral    Retinopathy    Type II diabetes mellitus with complication, uncontrolled    retinopathy, neuropathy, microalbuminuria   There were no vitals taken for this visit.  Opioid Risk Score:   Fall Risk Score:  `1  Depression screen PHQ 2/9  Depression screen [facility] 2/9 11/22/2019 07/20/2019 05/14/2019 05/10/2019 04/12/2019 09/12/2018 07/11/2018  Decreased Interest 0 0 0 0 1 0 0  Down, Depressed, Hopeless 0 0 0 0 1 0 0  PHQ - 2 Score 0 0 0 0 2 0 0  Altered sleeping - -  - - 2 - -  Tired, decreased energy - - - - 2 - -  Change in appetite - - - - 1 - -  Feeling bad or failure about yourself  - - - - 1 - -  Trouble concentrating - - - - 2 - -  Moving slowly or fidgety/restless - - - - 1 - -  Suicidal thoughts - - - - 0 - -  PHQ-9 Score - - - - 11 - -  Difficult doing work/chores - - - - Somewhat difficult - -    Review of Systems  Cardiovascular:  Positive for leg swelling.  Musculoskeletal:  Positive for gait problem.       Spasms  Skin:  Positive for wound.  Neurological:  Positive for tremors, weakness and numbness.       Tingling  Psychiatric/Behavioral:         Anxiety, depression  All other systems reviewed and are negative.     Objective:   Physical Exam Vitals and nursing note reviewed.  Constitutional:      Appearance: Normal appearance.  Cardiovascular:     Rate and Rhythm: Normal rate and regular rhythm.     Pulses: Normal pulses.     Heart sounds: Normal heart sounds.  Pulmonary:     Effort: Pulmonary effort is normal.     Breath sounds: Normal breath sounds.  Musculoskeletal:     Cervical back: Normal range of motion and neck supple.     Comments: Normal Muscle Bulk and Muscle Testing Reveals:  Upper Extremities: Full ROM and Muscle Strength 5/5  Lower Extremities: Right Lower Extremity: Decreased ROM Left Lower Extemity: Full ROM and Muscle Strength 5/5 Arises from Tableslowly using walker for support  Gait     Skin:    General: Skin is warm and dry.  Neurological:     Mental Status: He is alert and oriented to person, place, and time.  Psychiatric:        Mood and Affect: Mood normal.        Behavior: Behavior normal.         Assessment & Plan:  Thoracic Spondylosis with Myelopathy: He underwent: on 08/07/2021 by Dr Kathyrn Sheriff      Procedure Laterality Anesthesia  THORACIC FOUR - THORACIC FIVE LAMINECTOMY/DECOMPRESSION OF SPINAL CORD, DEBRIDEMENT OF ABSCESS, MICRODISCECTOMY, INTRAOPERATIVE ULTRASOUND N      ,2.MSSA Bacteremia, Osteomyelitis of Thoracic Region" ID Following and Wound Care.Continue Current Medication Regimen. Continue to Monitor 3, Essential Hypertension.Continue current medication regimen. PCP Following  4. Post- Operative Pain: Refill  MS Contin 15 mg one tablet every 12 hours.   F/U with Dr Dagoberto Ligas in 4- 6 weeks

## 2021-10-20 NOTE — Progress Notes (Signed)
JHAN, CONERY ([id number]) Visit Report for 10/20/2021 Arrival Information Details Patient Name: Date of Service: Jose [name], Jose [name] 10/20/2021 10:45 A M Medical Record Number: [id number] Patient Account Number: [id number] Date of Birth/Sex: Treating RN: 1976/04/03 (45 y.o. [name] Primary Care Kerrin Markman: [name] Other Clinician: Referring Kalin Kyler: Treating Othniel Maret/Extender: Rhae Hammock in Treatment: 3 Visit Information History Since Last Visit Added or deleted any medications: Yes Patient Arrived: Wheel Chair Any new allergies or adverse reactions: No Arrival Time: 10:56 Had a fall or experienced change in No Accompanied By: girlfirend activities of daily living that may affect Transfer Assistance: Manual risk of falls: Patient Requires Transmission-Based Precautions: No Signs or symptoms of abuse/neglect since last visito No Patient Has Alerts: No Hospitalized since last visit: No Implantable device outside of the clinic excluding No cellular tissue based products placed in the center since last visit: Has Dressing in Place as Prescribed: Yes Pain Present Now: Yes Electronic Signature(s) Signed: 10/20/2021 12:59:02 PM By: Baruch Gouty RN, BSN Entered By: Baruch Gouty on 10/20/2021 11:02:45 -------------------------------------------------------------------------------- Encounter Discharge Information Details Patient Name: Date of Service: Jose Cristal Deer. 10/20/2021 10:45 A M Medical Record Number: [id number] Patient Account Number: [id number] Date of Birth/Sex: Treating RN: 06-26-1976 (45 y.o. [name] Primary Care Demarqus Jocson: [name] Other Clinician: Referring Deneka Greenwalt: Treating Elesia Pemberton/Extender: Rhae Hammock in Treatment: 3 Encounter Discharge Information Items Discharge Condition: Stable Ambulatory Status: Wheelchair Discharge Destination: Home Transportation:  Private Auto Accompanied By: girlfriend Schedule Follow-up Appointment: Yes Clinical Summary of Care: Patient Declined Electronic Signature(s) Signed: 10/20/2021 12:59:02 PM By: Baruch Gouty RN, BSN Entered By: Baruch Gouty on 10/20/2021 11:41:58 -------------------------------------------------------------------------------- Negative Pressure Wound Therapy Application (NPWT) Details Patient Name: Date of Service: Jose WYNTER, GRAVE 10/20/2021 10:45 A M Medical Record Number: [id number] Patient Account Number: [id number] Date of Birth/Sex: Treating RN: 01-11-1976 (45 y.o. [name] Primary Care Fusae Florio: [name] Other Clinician: Referring Honey Zakarian: Treating Jazaria Jarecki/Extender: Rhae Hammock in Treatment: 3 NPWT Application Performed for: Wound #1 Sacrum Performed By: Baruch Gouty, RN Type: VAC System Coverage Size (sq cm): 8.51 Pressure Type: Constant Pressure Setting: 125 mmHG Drain Type: None Quantity of Sponges/Gauze Inserted: 1 Sponge/Dressing Type: Foam, Black Date Initiated: 10/20/2021 Response to Treatment: good Electronic Signature(s) Signed: 10/20/2021 12:59:02 PM By: Baruch Gouty RN, BSN Entered By: Baruch Gouty on 10/20/2021 11:40:29 -------------------------------------------------------------------------------- Pain Assessment Details Patient Name: Date of Service: Jose Cristal Deer. 10/20/2021 10:45 A M Medical Record Number: [id number] Patient Account Number: [id number] Date of Birth/Sex: Treating RN: 21-Mar-1976 (45 y.o. [name] Primary Care Teddrick Mallari: [name] Other Clinician: Referring Brockton Mckesson: Treating Cailean Heacock/Extender: Rhae Hammock in Treatment: 3 Active Problems Location of Pain Severity and Description of Pain Patient Has Paino Yes Site Locations Pain Location: Pain in Ulcers With Dressing Change: Yes Rate the pain. Current Pain Level:  3 Worst Pain Level: 7 Least Pain Level: 2 Character of Pain Describe the Pain: Aching, Tender Pain Management and Medication Current Pain Management: Medication: Yes Other: reposition Is the Current Pain Management Adequate: Adequate How does your wound impact your activities of daily livingo Sleep: Yes Bathing: No Appetite: No Relationship With Others: No Bladder Continence: No Emotions: No Bowel Continence: No Hobbies: No Toileting: No Dressing: No Electronic Signature(s) Signed: 10/20/2021 12:59:02 PM By: Baruch Gouty RN, BSN Entered By: Baruch Gouty on 10/20/2021 11:03:53 -------------------------------------------------------------------------------- Patient/Caregiver Education Details Patient Name: Date of Service: Jose V IS,  KIMMIE DOREN 12/27/2022andnbsp10:45 A M Medical Record Number: [id number] Patient Account Number: [id number] Date of Birth/Gender: Treating RN: 1976/06/11 (45 y.o. [name] Primary Care Physician: [name] Other Clinician: Referring Physician: Treating Physician/Extender: Rhae Hammock in Treatment: 3 Education Assessment Education Provided To: Patient Education Topics Provided Wound/Skin Impairment: Methods: Explain/Verbal Responses: Reinforcements needed, State content correctly Electronic Signature(s) Signed: 10/20/2021 12:59:02 PM By: Baruch Gouty RN, BSN Entered By: Baruch Gouty on 10/20/2021 11:41:37 -------------------------------------------------------------------------------- Wound Assessment Details Patient Name: Date of Service: Jose V IS, Hoyt Koch. 10/20/2021 10:45 A M Medical Record Number: [id number] Patient Account Number: [id number] Date of Birth/Sex: Treating RN: 11/01/1975 (45 y.o. [name] Primary Care Jobin Montelongo: [name] Other Clinician: Referring Satori Krabill: Treating Katee Wentland/Extender: Rhae Hammock in Treatment: 3 Wound  Status Wound Number: 1 Primary Etiology: Pressure Ulcer Wound Location: Sacrum Wound Status: Open Wounding Event: Pressure Injury Comorbid Hypertension, Type II Diabetes, Osteomyelitis, History: Neuropathy Date Acquired: 07/22/2021 Weeks Of Treatment: 3 Clustered Wound: No Wound Measurements Length: (cm) 3.7 Width: (cm) 2.3 Depth: (cm) 1 Area: (cm) 6.684 Volume: (cm) 6.684 % Reduction in Area: 52.7% % Reduction in Volume: 81.1% Epithelialization: Small (1-33%) Tunneling: No Undermining: No Wound Description Classification: Category/Stage III Wound Margin: Well defined, not attached Exudate Amount: Medium Exudate Type: Serosanguineous Exudate Color: red, brown Foul Odor After Cleansing: No Slough/Fibrino Yes Wound Bed Granulation Amount: Large (67-100%) Exposed Structure Granulation Quality: Red, Pink Fascia Exposed: No Necrotic Amount: Small (1-33%) Fat Layer (Subcutaneous Tissue) Exposed: Yes Necrotic Quality: Adherent Slough Tendon Exposed: No Muscle Exposed: No Joint Exposed: No Bone Exposed: No Treatment Notes Wound #1 (Sacrum) Cleanser Peri-Wound Care Topical Primary Dressing [facility] Secondary Dressing Secured With Compression Wrap Compression Stockings Add-Ons Electronic Signature(s) Signed: 10/20/2021 12:59:02 PM By: Baruch Gouty RN, BSN Entered By: Baruch Gouty on 10/20/2021 11:39:48

## 2021-10-22 ENCOUNTER — Ambulatory Visit (HOSPITAL_BASED_OUTPATIENT_CLINIC_OR_DEPARTMENT_OTHER): Payer: 59 | Admitting: Internal Medicine

## 2021-10-22 NOTE — Progress Notes (Signed)
OSHUA, MCCONAHA ([id number]) Visit Report for 10/20/2021 SuperBill Details Patient Name: Date of Service: Jose [name], Jose [name] 10/20/2021 Medical Record Number: [id number] Patient Account Number: [id number] Date of Birth/Sex: Treating RN: 04-12-1976 (45 y.o. Ulyses Amor, Vaughan Basta Primary Care Provider: [name] Other Clinician: Referring Provider: Treating Provider/Extender: Rhae Hammock in Treatment: 3 Diagnosis Coding ICD-10 Codes Code Description [id number] Pressure ulcer of sacral region, stage 3 M46.28 Osteomyelitis of vertebra, sacral and sacrococcygeal region E11.622 Type 2 diabetes mellitus with other skin ulcer M62.81 Muscle weakness (generalized) L05.01 Pilonidal cyst with abscess N18.30 Chronic kidney disease, stage 3 unspecified Facility Procedures CPT4 Code Description Modifier Quantity [id number] 97605 - WOUND VAC-50 SQ CM OR LESS 1 Electronic Signature(s) Signed: 10/20/2021 12:59:02 PM By: Baruch Gouty RN, BSN Signed: 10/22/2021 11:32:23 AM By: Kalman Shan DO Entered By: Baruch Gouty on 10/20/2021 11:42:04

## 2021-10-23 ENCOUNTER — Other Ambulatory Visit: Payer: Self-pay

## 2021-10-23 ENCOUNTER — Encounter (HOSPITAL_BASED_OUTPATIENT_CLINIC_OR_DEPARTMENT_OTHER): Payer: 59 | Admitting: Internal Medicine

## 2021-10-23 DIAGNOSIS — L89153 Pressure ulcer of sacral region, stage 3: Secondary | ICD-10-CM | POA: Diagnosis not present

## 2021-10-23 NOTE — Progress Notes (Signed)
[place]OT[place], KORVER ([id number]) Visit Report for 10/23/2021 SuperBill Details Patient Name: Date of Service: Jose [name], Jose [name] 10/23/2021 Medical Record Number: [id number] Patient Account Number: [id number] Date of Birth/Sex: Treating RN: 20-Aug-1976 (45 y.o. Ulyses Amor, Vaughan Basta Primary Care Provider: [name] Other Clinician: Referring Provider: Treating Provider/Extender: Rhae Hammock in Treatment: 4 Diagnosis Coding ICD-10 Codes Code Description [phone] Pressure ulcer of sacral region, stage 3 M46.28 Osteomyelitis of vertebra, sacral and sacrococcygeal region E11.622 Type 2 diabetes mellitus with other skin ulcer M62.81 Muscle weakness (generalized) L05.01 Pilonidal cyst with abscess N18.30 Chronic kidney disease, stage 3 unspecified Facility Procedures CPT4 Code Description Modifier Quantity [id number] - WOUND VAC-50 SQ CM OR LESS 1 Electronic Signature(s) Signed: 10/23/2021 1:10:53 PM By: Kalman Shan DO Signed: 10/23/2021 1:50:30 PM By: Baruch Gouty RN, BSN Entered By: Baruch Gouty on 10/23/2021 [place]

## 2021-10-23 NOTE — Progress Notes (Signed)
Jose [name], Jose [name] ([id number]) Visit Report for 10/23/2021 Arrival Information Details Patient Name: Date of Service: Jose [name], Jose [name] 10/23/2021 9:15 A M Medical Record Number: [id number] Patient Account Number: [id number] Date of Birth/Sex: Treating RN: 1976/08/13 (45 y.o. [name] Primary Care Yoltzin Ransom: [name] Other Clinician: Referring Jared Cahn: Treating Trejan Buda/Extender: Rhae Hammock in Treatment: 4 Visit Information History Since Last Visit Added or deleted any medications: No Patient Arrived: Wheel Chair Any new allergies or adverse reactions: No Arrival Time: 09:10 Had a fall or experienced change in No Accompanied By: girlfriend activities of daily living that may affect Transfer Assistance: Manual risk of falls: Patient Identification Verified: Yes Signs or symptoms of abuse/neglect since last visito No Secondary Verification Process Completed: Yes Hospitalized since last visit: No Patient Requires Transmission-Based Precautions: No Implantable device outside of the clinic excluding No Patient Has Alerts: No cellular tissue based products placed in the center since last visit: Has Dressing in Place as Prescribed: Yes Pain Present Now: Yes Electronic Signature(s) Signed: 10/23/2021 1:50:30 PM By: Baruch Gouty RN, BSN Entered By: Baruch Gouty on 10/23/2021 09:11:03 -------------------------------------------------------------------------------- Encounter Discharge Information Details Patient Name: Date of Service: Jose [name]. 10/23/2021 9:15 A M Medical Record Number: [id number] Patient Account Number: [id number] Date of Birth/Sex: Treating RN: 29-Apr-1976 (45 y.o. [name] Primary Care Kacper Cartlidge: [name] Other Clinician: Referring Jennel Mara: Treating Balinda Heacock/Extender: Rhae Hammock in Treatment: 4 Encounter Discharge Information Items Discharge Condition:  Stable Ambulatory Status: Wheelchair Discharge Destination: Home Transportation: Private Auto Accompanied By: girlfriend Schedule Follow-up Appointment: Yes Clinical Summary of Care: Patient Declined Electronic Signature(s) Signed: 10/23/2021 1:50:30 PM By: Baruch Gouty RN, BSN Entered By: Baruch Gouty on 10/23/2021 09:40:31 -------------------------------------------------------------------------------- Negative Pressure Wound Therapy Maintenance (NPWT) Details Patient Name: Date of Service: Jose [name], Jose [name] 10/23/2021 9:15 A M Medical Record Number: [id number] Patient Account Number: [id number] Date of Birth/Sex: Treating RN: 25-Aug-1976 (45 y.o. [name] Primary Care Ewald Beg: [name] Other Clinician: Referring Jennae Hakeem: Treating Treasa Bradshaw/Extender: Rhae Hammock in Treatment: 4 NPWT Maintenance Performed for: Wound #1 Sacrum Performed By: Baruch Gouty, RN Type: VAC System Coverage Size (sq cm): 8.51 Pressure Type: Constant Pressure Setting: 125 mmHG Drain Type: None Sponge/Dressing Type: Foam, Black Date Initiated: 10/20/2021 Dressing Removed: No Quantity of Sponges/Gauze Removed: 1 Canister Changed: No Dressing Reapplied: No Quantity of Sponges/Gauze Inserted: 1 Respones T Treatment: o good Days On NPWT : 4 Electronic Signature(s) Signed: 10/23/2021 1:50:30 PM By: Baruch Gouty RN, BSN Entered By: Baruch Gouty on 10/23/2021 09:38:22 -------------------------------------------------------------------------------- Pain Assessment Details Patient Name: Date of Service: Jose [name]. 10/23/2021 9:15 A M Medical Record Number: [id number] Patient Account Number: [id number] Date of Birth/Sex: Treating RN: [birth year] (45 y.o. [name] Primary Care Key Cen: [name] Other Clinician: Referring Itzia Cunliffe: Treating Minor Iden/Extender: Rhae Hammock in Treatment:  4 Active Problems Location of Pain Severity and Description of Pain Patient Has Paino Yes Site Locations Pain Location: Pain in Ulcers With Dressing Change: Yes Duration of the Pain. Constant / Intermittento Constant Rate the pain. Current Pain Level: 4 Character of Pain Describe the Pain: Aching, Tender Pain Management and Medication Current Pain Management: Medication: Yes Other: reposition [name] the Current Pain Management Adequate: Adequate How does your wound impact your activities of daily livingo Sleep: No Bathing: No Appetite: No Relationship With Others: No Bladder Continence: No Emotions: No Bowel Continence: No Work: No Toileting: No Drive: No Dressing:  No Hobbies: No Electronic Signature(s) Signed: 10/23/2021 1:50:30 PM By: Baruch Gouty RN, BSN Entered By: Baruch Gouty on 10/23/2021 09:41:24 -------------------------------------------------------------------------------- Patient/Caregiver Education Details Patient Name: Date of Service: Jose [name] 12/30/2022andnbsp9:15 A M Medical Record Number: [id number] Patient Account Number: [id number] Date of Birth/Gender: Treating RN: [birth year] (45 y.o. [name] Primary Care Physician: [name] Other Clinician: Referring Physician: Treating Physician/Extender: Rhae Hammock in Treatment: 4 Education Assessment Education Provided To: Patient Education Topics Provided Wound/Skin Impairment: Methods: Explain/Verbal Responses: Reinforcements needed, State content correctly Electronic Signature(s) Signed: 10/23/2021 1:50:30 PM By: Baruch Gouty RN, BSN Entered By: Baruch Gouty on 10/23/2021 09:40:06 -------------------------------------------------------------------------------- Wound Assessment Details Patient Name: Date of Service: Jose [name], Jose Koch. 10/23/2021 9:15 A M Medical Record Number: [id number] Patient Account Number: [id number] Date of  Birth/Sex: Treating RN: 1976-07-28 (45 y.o. [name] Primary Care Spirit Wernli: [name] Other Clinician: Referring Fatmata Legere: Treating Alp Goldwater/Extender: Rhae Hammock in Treatment: 4 Wound Status Wound Number: 1 Primary Etiology: Pressure Ulcer Wound Location: Sacrum Wound Status: Open Wounding Event: Pressure Injury Comorbid Hypertension, Type II Diabetes, Osteomyelitis, History: Neuropathy Date Acquired: 07/22/2021 Weeks Of Treatment: 4 Clustered Wound: No Wound Measurements Length: (cm) 3.7 Width: (cm) 2.3 Depth: (cm) 1 Area: (cm) 6.684 Volume: (cm) 6.684 % Reduction in Area: 52.7% % Reduction in Volume: 81.1% Epithelialization: Small (1-33%) Tunneling: No Undermining: No Wound Description Classification: Category/Stage III Wound Margin: Well defined, not attached Exudate Amount: Medium Exudate Type: Serosanguineous Exudate Color: red, brown Foul Odor After Cleansing: No Slough/Fibrino Yes Wound Bed Granulation Amount: Large (67-100%) Exposed Structure Granulation Quality: Red, Pink Fascia Exposed: No Necrotic Amount: None Present (0%) Fat Layer (Subcutaneous Tissue) Exposed: Yes Tendon Exposed: No Muscle Exposed: No Joint Exposed: No Bone Exposed: No Treatment Notes Wound #1 (Sacrum) Cleanser Soap and Water Discharge Instruction: May shower and wash wound with dial antibacterial soap and water prior to dressing change. Wound Cleanser Discharge Instruction: Cleanse the wound with wound cleanser prior to applying a clean dressing using gauze sponges, not tissue or cotton balls. Peri-Wound Care Skin Prep Discharge Instruction: Use skin prep as directed Topical Primary Dressing Secondary Dressing Secured With Compression Wrap Compression Stockings Add-Ons Notes VAC Electronic Signature(s) Signed: 10/23/2021 1:50:30 PM By: Baruch Gouty RN, BSN Entered By: Baruch Gouty on 10/23/2021  09:36:30 -------------------------------------------------------------------------------- [place] Details Patient Name: Date of Service: Jose [name]. 10/23/2021 9:15 A M Medical Record Number: [id number] Patient Account Number: [id number] Date of Birth/Sex: Treating RN: 01-Jul-1976 (45 y.o. [name] Primary Care Seleni Meller: [name] Other Clinician: Referring Denetta Fei: Treating Afnan Emberton/Extender: Rhae Hammock in Treatment: 4 Vital Signs Time Taken: 09:11 Temperature (F): 98.4 Height (in): 71 Pulse (bpm): 82 Source: Stated Respiratory Rate (breaths/min): 18 Weight (lbs): 180 Blood Pressure (mmHg): 169/85 Source: Stated Reference Range: 80 - 120 mg / dl Body Mass Index (BMI): 25.1 Electronic Signature(s) Signed: 10/23/2021 1:50:30 PM By: Baruch Gouty RN, BSN Entered By: Baruch Gouty on 10/23/2021 09:11:39

## 2021-10-27 ENCOUNTER — Encounter (HOSPITAL_BASED_OUTPATIENT_CLINIC_OR_DEPARTMENT_OTHER): Payer: 59 | Attending: Internal Medicine | Admitting: Internal Medicine

## 2021-10-27 ENCOUNTER — Telehealth: Payer: Self-pay

## 2021-10-27 ENCOUNTER — Ambulatory Visit (HOSPITAL_COMMUNITY)
Admission: RE | Admit: 2021-10-27 | Discharge: 2021-10-27 | Disposition: A | Discharge status: Home / Self Care | Payer: 59 | Source: Ambulatory Visit | Attending: Infectious Diseases | Admitting: Infectious Diseases

## 2021-10-27 ENCOUNTER — Other Ambulatory Visit: Payer: Self-pay

## 2021-10-27 DIAGNOSIS — E1140 Type 2 diabetes mellitus with diabetic neuropathy, unspecified: Secondary | ICD-10-CM | POA: Diagnosis not present

## 2021-10-27 DIAGNOSIS — M4628 Osteomyelitis of vertebra, sacral and sacrococcygeal region: Secondary | ICD-10-CM | POA: Insufficient documentation

## 2021-10-27 DIAGNOSIS — I129 Hypertensive chronic kidney disease with stage 1 through stage 4 chronic kidney disease, or unspecified chronic kidney disease: Secondary | ICD-10-CM | POA: Insufficient documentation

## 2021-10-27 DIAGNOSIS — N183 Chronic kidney disease, stage 3 unspecified: Secondary | ICD-10-CM | POA: Diagnosis not present

## 2021-10-27 DIAGNOSIS — L89153 Pressure ulcer of sacral region, stage 3: Secondary | ICD-10-CM | POA: Diagnosis not present

## 2021-10-27 DIAGNOSIS — E1122 Type 2 diabetes mellitus with diabetic chronic kidney disease: Secondary | ICD-10-CM | POA: Insufficient documentation

## 2021-10-27 DIAGNOSIS — E11622 Type 2 diabetes mellitus with other skin ulcer: Secondary | ICD-10-CM | POA: Insufficient documentation

## 2021-10-27 IMAGING — MR MR THORACIC SPINE WO/W CM
8 of 9 series · 30 of 48 positions shown · IV contrast (gadavist)
Comparison: Preoperative thoracic spine MRI [DATE] and earlier.
Chest CT [DATE].

CLINICAL DATA: 45-year-old male with a history of bacteremia and
T4-T5 discitis osteomyelitis in [REDACTED] status post laminectomy,
decompression.

EXAM:
MRI THORACIC WITHOUT AND WITH CONTRAST
TECHNIQUE: Multiplanar and multiecho pulse sequences of the thoracic spine were
obtained without and with intravenous contrast.
CONTRAST:  9mL GADAVIST GADOBUTROL 1 MMOL/ML IV SOLN

[Series 16: T1 · sagittal · 4.0mm · 1.72mm/px · 1 of 5 slices shown (1 of 3)]
[im 1/5]
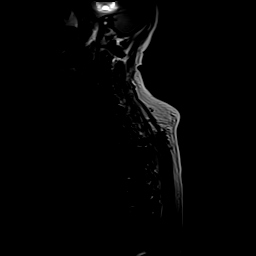

[Series 17: STIR · sagittal · 3.0mm · 1.06mm/px · 2 of 15 slices shown]
[im 1/15]
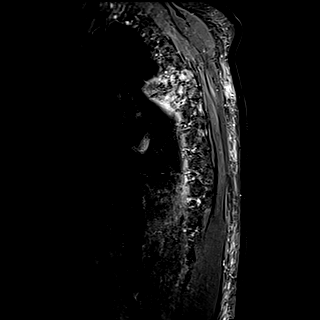
[im 15/15]
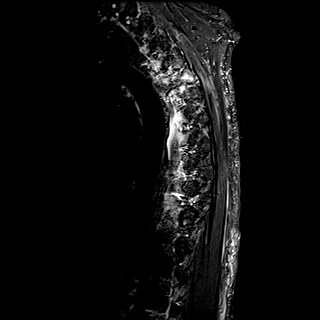

[Series 18: T1 · sagittal · 3.0mm · 1.06mm/px · 3 of 15 slices shown (2 of 3)]
[im 1/15]
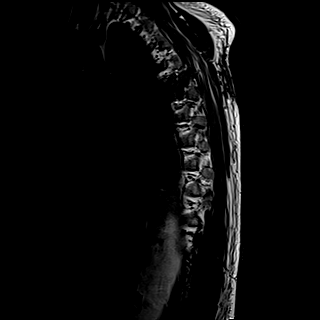
[im 8/15]
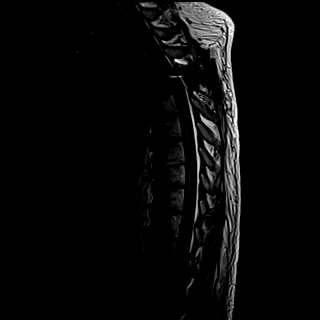
[im 15/15]
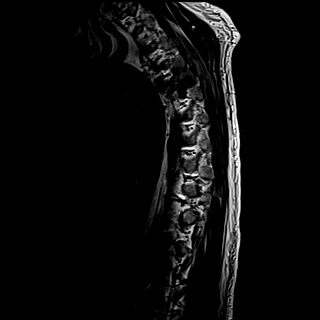

[Series 19: T2 · axial · 4.0mm · 0.78mm/px · z∈[-291,-45]mm · 9 of 45 slices shown]
[im 1/45]
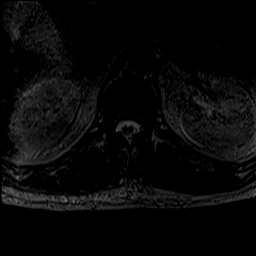
[im 6/45]
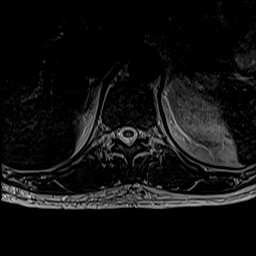
[im 12/45]
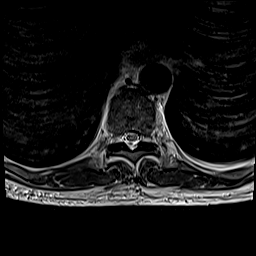
[im 17/45]
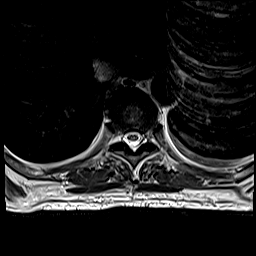
[im 23/45]
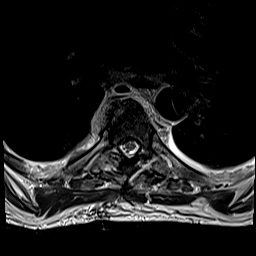
[im 28/45]
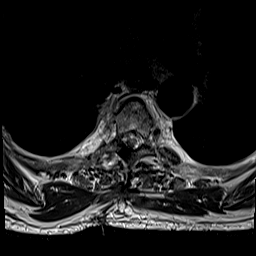
[im 34/45]
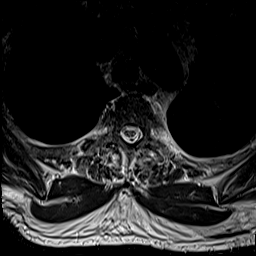
[im 39/45]
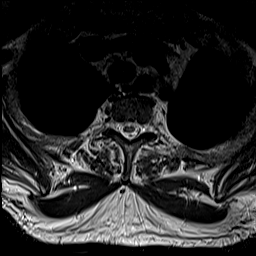
[im 45/45]
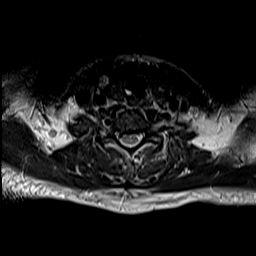

[Series 21: T1 · axial · 4.0mm · 0.39mm/px · z∈[-291,-45]mm · 8 of 45 slices shown (3 of 3)]
[im 1/45]
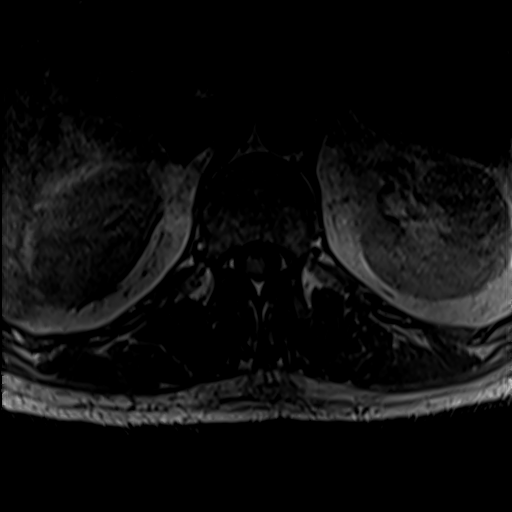
[im 6/45]
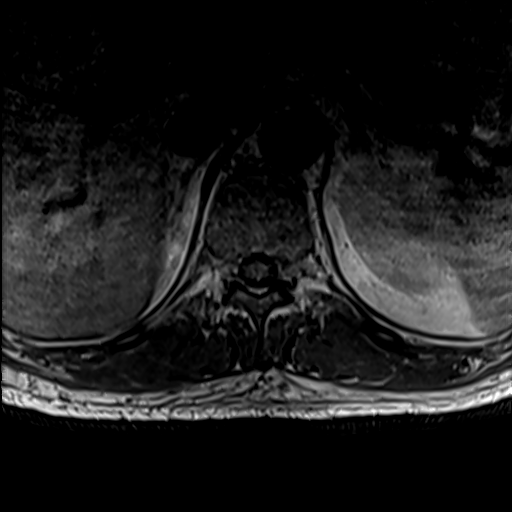
[im 12/45]
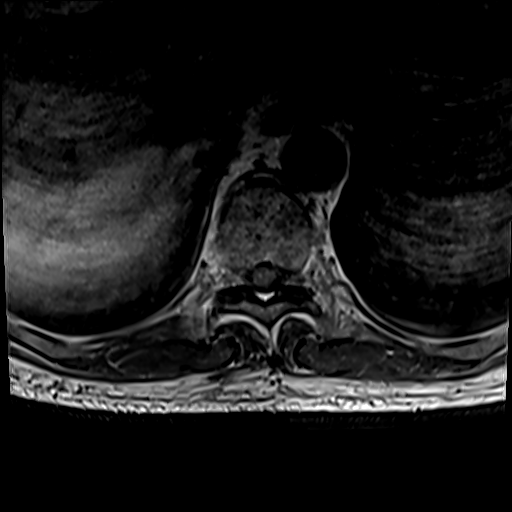
[im 17/45]
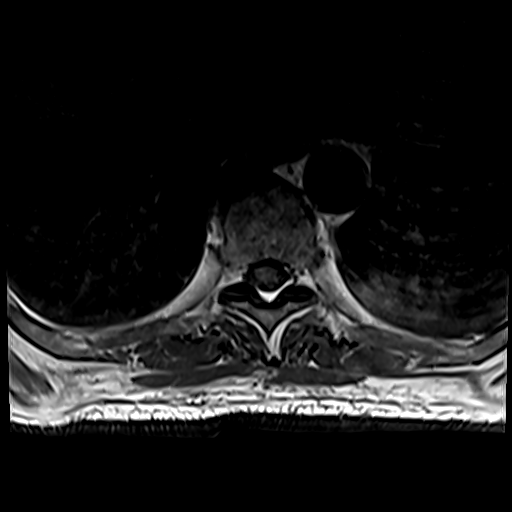
[im 28/45]
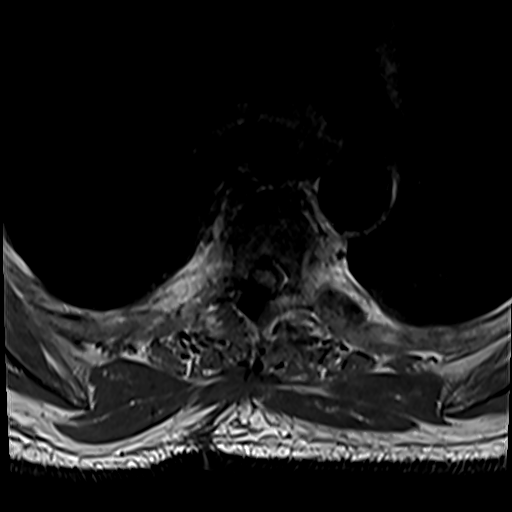
[im 34/45]
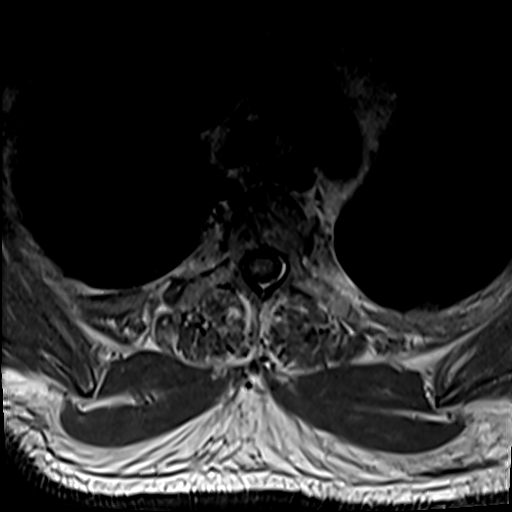
[im 39/45]
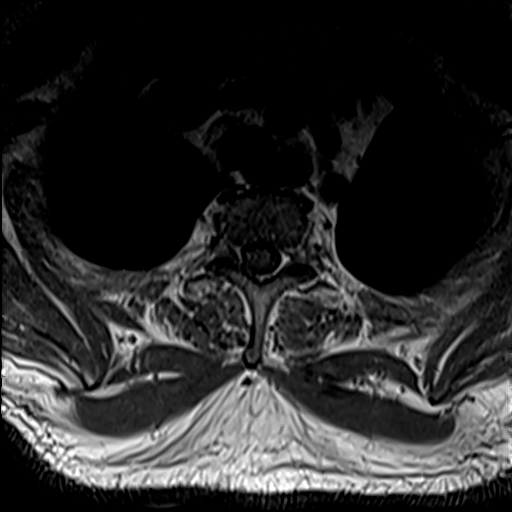
[im 45/45]
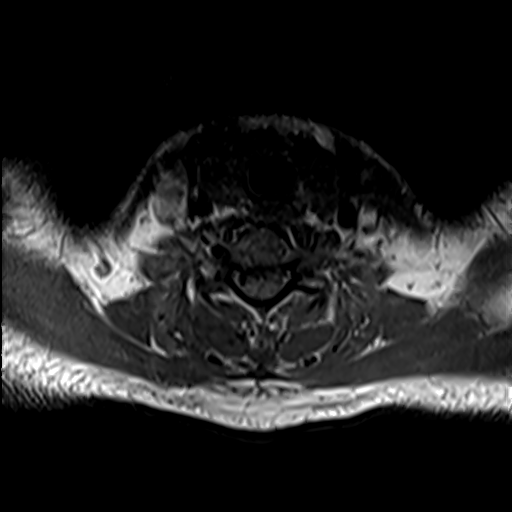

[Series 22: T2 post-contrast · sagittal · 3.0mm · 0.89mm/px · 3 of 15 slices shown]
[im 1/15]
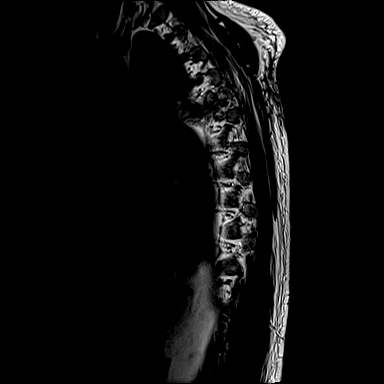
[im 8/15]
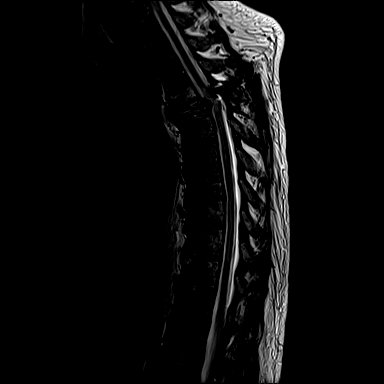
[im 15/15]
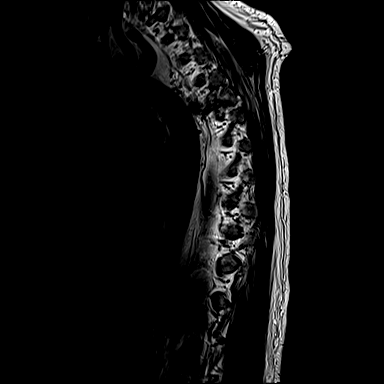

[Series 23: T1 fat-sat post-contrast · sagittal · 3.0mm · 1.06mm/px · 3 of 15 slices shown]
[im 1/15]
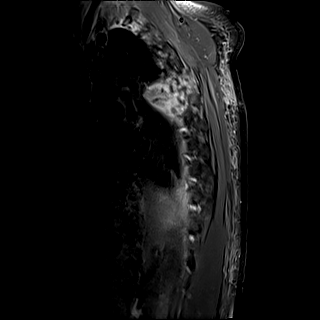
[im 8/15]
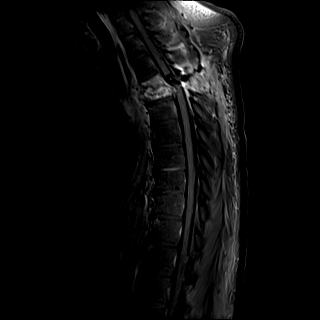
[im 15/15]
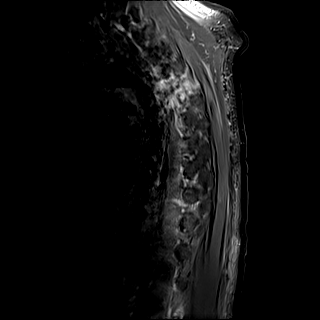

[Series 24: T1 post-contrast · axial · 4.0mm · 0.39mm/px · 1 of 45 slices shown]
[im 1/45]
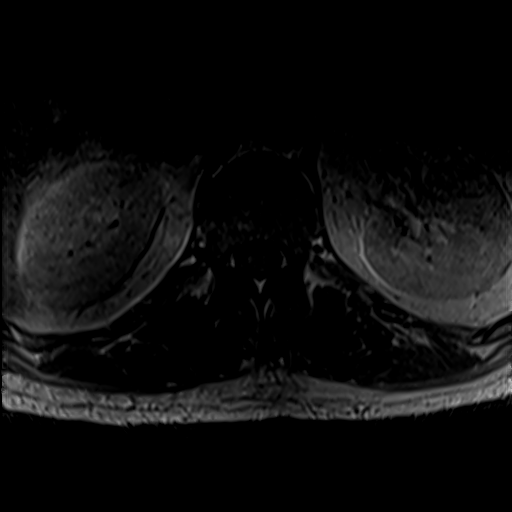

[30 of 48 positions shown; findings below may reference images not displayed]

FINDINGS: Limited cervical spine imaging: Stable, no definite acute process in
the cervical spine.

Thoracic spine segmentation: Normal on the [facility] CT, the same
numbering system used on the [facility] MRI.

Alignment: Overall thoracic kyphosis has not significantly changed
since [REDACTED]. No significant spondylolisthesis, with some metal
susceptibility artifact at T4-T5.

Vertebrae: Subtotal collapse of abnormally eroded and enhancing T5
vertebral body. Moderate similar erosion of the T4 body. Both levels
demonstrate confluent marrow edema and enhancement, which also
involves the bilateral posterior 5th ribs and costovertebral
junctions. Mild superimposed hardware susceptibility artifact in the
region. The T3 vertebra remains spared. There is new patchy T6
anterior superior endplate marrow edema and enhancement on the right
(series 17, image 6), although that disc space remains within normal
limits. Postoperative changes to the T4 and T5 posterior elements
also with marrow edema in the right T5-T6 facets.

Other thoracic levels remain intact without marrow edema.

Cord: Susceptibility artifact at T4-T5 limits spinal cord detail
there. But there does appear to be spinal stenosis and at least mild
spinal cord mass effect related to retropulsion of the T5 body as
seen on series 22, image 8. Difficult to exclude abnormal cord
signal there, although there is no abnormal intradural enhancement.

Above and below T4-T5 cord signal and morphology remains within
normal limits. No significant dural thickening. Conus medullaris at
T12-L1.

Paraspinal and other soft tissues: Postoperative changes to the
paraspinal soft tissues at T4 and T5, see details below.

Elsewhere visible chest, upper abdominal viscera, and paraspinal
soft tissues remain within normal limits.

Disc levels:

T1-T2 through T3-T4: Negative.

T4-T5: Obliterated disc space and partial collapse of the vertebral
bodies, especially T5, as above. Residual circumferential paraspinal
phlegmon (series 21, image 21) with postoperative changes to the
lamina here. Moderate to severe bilateral T4 neural foraminal
stenosis. Retropulsion of T5 resulting in spinal stenosis and at
least mild spinal cord mass effect. No paraspinal fluid collection.

T5-T6: Relatively maintained disc space but moderate to severe
bilateral T5 neural foraminal stenosis related to the T5 vertebral
body collapse. No spinal stenosis at this level.

T6-T7 through T11-T12: Negative aside from subtle disc bulge or
protrusion at T8-T9.
IMPRESSION: 1. Discitis Osteomyelitis sequelae with subtotal collapse of T5 and
further erosion of the inferior T4 body since the preoperative MRI
in [REDACTED].
Posterior decompression, but retropulsion of T5 results in spinal
stenosis with at least mild cord mass effect. Postoperative
susceptibility artifact affects spinal cord detail, but the
appearance now is suspicious for new cord edema or myelomalacia at
that level.

2. Ongoing circumferential paraspinal phlegmon at T4-T5, but no
drainable fluid collection.

3. Mild new edema in the T6 anterior superior endplate and right
side T5-T6 posterior elements, but favor stress reaction due to
abnormal biomechanics rather than extension of infection.

## 2021-10-27 MED ORDER — GADOBUTROL 1 MMOL/ML IV SOLN
9.0000 mL | Freq: Once | INTRAVENOUS | Status: AC | PRN
  Administered 2021-10-27: 9 mL via INTRAVENOUS

## 2021-10-27 NOTE — Progress Notes (Signed)
JASYN, MEY ([id number]) Visit Report for 10/27/2021 SuperBill Details Patient Name: Date of Service: REA AKSHAY, SPANG 10/27/2021 Medical Record Number: [id number] Patient Account Number: [id number] Date of Birth/Sex: Treating RN: 1976-09-16 (46 y.o. Lorette Ang, Meta.Reding Primary Care Provider: [name] Other Clinician: Referring Provider: Treating Provider/Extender: Rhae Hammock in Treatment: 4 Diagnosis Coding ICD-10 Codes Code Description [id number] Pressure ulcer of sacral region, stage 3 M46.28 Osteomyelitis of vertebra, sacral and sacrococcygeal region E11.622 Type 2 diabetes mellitus with other skin ulcer M62.81 Muscle weakness (generalized) L05.01 Pilonidal cyst with abscess N18.30 Chronic kidney disease, stage 3 unspecified Facility Procedures CPT4 Code Description Modifier Quantity [id number] 97605 - WOUND VAC-50 SQ CM OR LESS 1 Electronic Signature(s) Signed: 10/27/2021 9:40:07 AM By: Kalman Shan DO Signed: 10/27/2021 5:28:06 PM By: Deon Pilling RN, BSN Entered By: Deon Pilling on 10/27/2021 08:55:23

## 2021-10-27 NOTE — Telephone Encounter (Signed)
Patient returned call and spoke with Dr. West Bali.  Jose [name]

## 2021-10-27 NOTE — Progress Notes (Addendum)
JEFTE, CARITHERS ([id number]) Visit Report for 10/27/2021 Arrival Information Details Patient Name: Date of Service: Jose [name], Jose [name] 10/27/2021 8:00 A M Medical Record Number: [id number] Patient Account Number: [id number] Date of Birth/Sex: Treating RN: August 20, 1976 (46 y.o. M) Primary Care Farhiya Rosten: [name] Other Clinician: Referring Kelsei Defino: Treating Denitra Donaghey/Extender: Jacinto Halim in Treatment: 4 Visit Information History Since Last Visit Added or deleted any medications: No Patient Arrived: Wheel Chair Any new allergies or adverse reactions: No Arrival Time: 08:20 Had a fall or experienced change in No Accompanied By: wife activities of daily living that may affect Transfer Assistance: Manual risk of falls: Patient Identification Verified: Yes Signs or symptoms of abuse/neglect since last visito No Secondary Verification Process Completed: Yes Hospitalized since last visit: No Patient Requires Transmission-Based Precautions: No Implantable device outside of the clinic excluding No Patient Has Alerts: No cellular tissue based products placed in the center since last visit: Has Dressing in Place as Prescribed: Yes Pain Present Now: Yes Electronic Signature(s) Signed: 10/27/2021 8:21:58 AM By: Sandre Kitty Entered By: Sandre Kitty on 10/27/2021 08:20:59 -------------------------------------------------------------------------------- Encounter Discharge Information Details Patient Name: Date of Service: Jose [name]. 10/27/2021 8:00 A M Medical Record Number: [id number] Patient Account Number: [id number] Date of Birth/Sex: Treating RN: 1976/06/10 (46 y.o. [name] Primary Care Kecia Swoboda: [name] Other Clinician: Referring Lyan Moyano: Treating Jaspal Pultz/Extender: Rhae Hammock in Treatment: 4 Encounter Discharge Information Items Discharge Condition: Stable Ambulatory Status: Wheelchair Discharge Destination:  Home Transportation: Private Auto Accompanied By: girlfriend Schedule Follow-up Appointment: Yes Clinical Summary of Care: Electronic Signature(s) Signed: 10/27/2021 5:28:06 PM By: Deon Pilling RN, BSN Entered By: Deon Pilling on 10/27/2021 08:55:19 -------------------------------------------------------------------------------- Negative Pressure Wound Therapy Maintenance (NPWT) Details Patient Name: Date of Service: Jose [name], Jose [name] 10/27/2021 8:00 A M Medical Record Number: [id number] Patient Account Number: [id number] Date of Birth/Sex: Treating RN: [birth year] (46 y.o. [name] Primary Care Kinzlie Harney: [name] Other Clinician: Referring Antwain Caliendo: Treating Jaydyn Menon/Extender: Rhae Hammock in Treatment: 4 NPWT Maintenance Performed for: Wound #1 Sacrum Performed By: Deon Pilling, RN Type: VAC System Coverage Size (sq cm): 8.51 Pressure Type: Constant Pressure Setting: 125 mmHG Drain Type: None Primary Contact: Non-Adherent Sponge/Dressing Type: Foam, Black Date Initiated: 10/20/2021 Dressing Removed: Yes Quantity of Sponges/Gauze Removed: 1 black foam bridged to right side of hip. Canister Changed: Yes Canister Exudate Volume: 50 Dressing Reapplied: Yes Quantity of Sponges/Gauze Inserted: 1 black foam bridged to right side of hip. Respones T Treatment: o tolerated well Days On NPWT : 8 Electronic Signature(s) Signed: 10/27/2021 5:28:06 PM By: Deon Pilling RN, BSN Entered By: Deon Pilling on 10/27/2021 08:53:54 -------------------------------------------------------------------------------- Patient/Caregiver Education Details Patient Name: Date of Service: Jose [name] 1/3/2023andnbsp8:00 A M Medical Record Number: [id number] Patient Account Number: [id number] Date of Birth/Gender: Treating RN: Jan 11, 1976 (46 y.o. [name] Primary Care Physician: [name] Other Clinician: Referring Physician: Treating  Physician/Extender: Rhae Hammock in Treatment: 4 Education Assessment Education Provided To: Patient Education Topics Provided Wound/Skin Impairment: Handouts: Skin Care Do's and Dont's Methods: Explain/Verbal Responses: Reinforcements needed Electronic Signature(s) Signed: 10/27/2021 5:28:06 PM By: Deon Pilling RN, BSN Entered By: Deon Pilling on 10/27/2021 08:55:07 -------------------------------------------------------------------------------- Wound Assessment Details Patient Name: Date of Service: Jose [name]. 10/27/2021 8:00 A M Medical Record Number: [id number] Patient Account Number: [id number] Date of Birth/Sex: Treating RN: [birth year] (46 y.o. [name] Primary Care Crisanto Nied: [name] Other Clinician: Referring Erminia Mcnew:  Treating Prentice Sackrider/Extender: Rhae Hammock in Treatment: 4 Wound Status Wound Number: 1 Primary Etiology: Pressure Ulcer Wound Location: Sacrum Wound Status: Open Wounding Event: Pressure Injury Comorbid Hypertension, Type II Diabetes, Osteomyelitis, History: Neuropathy Date Acquired: 07/22/2021 Weeks Of Treatment: 4 Clustered Wound: No Wound Measurements Length: (cm) 2.5 Width: (cm) 2.5 Depth: (cm) 0.1 Area: (cm) 4.909 Volume: (cm) 0.491 % Reduction in Area: 65.3% % Reduction in Volume: 98.6% Epithelialization: Medium (34-66%) Tunneling: No Undermining: No Wound Description Classification: Category/Stage III Wound Margin: Well defined, not attached Exudate Amount: Medium Exudate Type: Serosanguineous Exudate Color: red, brown Foul Odor After Cleansing: No Slough/Fibrino No Wound Bed Granulation Amount: Large (67-100%) Exposed Structure Granulation Quality: Red, Pink Fascia Exposed: No Necrotic Amount: None Present (0%) Fat Layer (Subcutaneous Tissue) Exposed: Yes Tendon Exposed: No Muscle Exposed: No Joint Exposed: No Bone Exposed: No Treatment  Notes Wound #1 (Sacrum) Cleanser Soap and Water Discharge Instruction: May shower and wash wound with dial antibacterial soap and water prior to dressing change. Wound Cleanser Discharge Instruction: Cleanse the wound with wound cleanser prior to applying a clean dressing using gauze sponges, not tissue or cotton balls. Peri-Wound Care Skin Prep Discharge Instruction: Use skin prep as directed Topical Primary Dressing Secondary Dressing Secured With Compression Wrap Compression Stockings Add-Ons Notes wound vac applied. Electronic Signature(s) Signed: 10/27/2021 5:28:06 PM By: Deon Pilling RN, BSN Previous Signature: 10/27/2021 8:21:58 AM Version By: Sandre Kitty Entered By: Deon Pilling on 10/27/2021 08:54:16 -------------------------------------------------------------------------------- Vitals Details Patient Name: Date of Service: Jose [name], Jose [name]. 10/27/2021 8:00 A M Medical Record Number: [id number] Patient Account Number: [id number] Date of Birth/Sex: Treating RN: [birth year] (46 y.o. M) Primary Care Lorrena Goranson: [name] Other Clinician: Referring Winthrop Shannahan: Treating Kamber Vignola/Extender: Jacinto Halim in Treatment: 4 Vital Signs Time Taken: 08:20 Temperature (F): 98.5 Height (in): 71 Pulse (bpm): 82 Weight (lbs): 180 Respiratory Rate (breaths/min): 18 Body Mass Index (BMI): 25.1 Blood Pressure (mmHg): 182/83 Reference Range: 80 - 120 mg / dl Electronic Signature(s) Signed: 10/27/2021 8:21:58 AM By: Sandre Kitty Entered By: Sandre Kitty on 10/27/2021 08:21:18

## 2021-10-27 NOTE — Telephone Encounter (Signed)
-----   Message from Rosiland Oz, MD sent at 10/27/2021 11:36 AM EST ----- I tried calling this patient both in his listed home phone as well as mobile number to discuss  his MRI results - did not pick up.   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 There is also mention of spinal stenosis and mild cord mass effect. If he has any concerning symptoms like new weakness, numbness, etc. he should go the hospital. If not, he should follow up with his Neurosurgeon. I will see if I can get in touch with his Neurosurgeon Dr Kathyrn Sheriff.

## 2021-10-28 ENCOUNTER — Encounter (HOSPITAL_BASED_OUTPATIENT_CLINIC_OR_DEPARTMENT_OTHER): Payer: 59 | Admitting: Physician Assistant

## 2021-10-28 DIAGNOSIS — L89153 Pressure ulcer of sacral region, stage 3: Secondary | ICD-10-CM | POA: Diagnosis not present

## 2021-10-28 DIAGNOSIS — E1122 Type 2 diabetes mellitus with diabetic chronic kidney disease: Secondary | ICD-10-CM | POA: Diagnosis not present

## 2021-10-28 DIAGNOSIS — N183 Chronic kidney disease, stage 3 unspecified: Secondary | ICD-10-CM | POA: Diagnosis not present

## 2021-10-28 DIAGNOSIS — E11622 Type 2 diabetes mellitus with other skin ulcer: Secondary | ICD-10-CM | POA: Diagnosis not present

## 2021-10-28 DIAGNOSIS — I129 Hypertensive chronic kidney disease with stage 1 through stage 4 chronic kidney disease, or unspecified chronic kidney disease: Secondary | ICD-10-CM | POA: Diagnosis not present

## 2021-10-28 DIAGNOSIS — E1140 Type 2 diabetes mellitus with diabetic neuropathy, unspecified: Secondary | ICD-10-CM | POA: Diagnosis not present

## 2021-10-28 NOTE — Progress Notes (Addendum)
HARLEY, FITZWATER ([id number]) Visit Report for 10/28/2021 Chief Complaint Document Details Patient Name: Date of Service: Jose [name], Jose [name] 10/28/2021 10:30 A M Medical Record Number: [id number] Patient Account Number: [id number] Date of Birth/Sex: Treating RN: 12/20/1975 (46 y.o. [name] Primary Care Provider: [name] Other Clinician: Referring Provider: Treating Provider/Extender: Cristina Gong in Treatment: 5 Information Obtained from: Patient Chief Complaint Sacral abscess with osteomyelitis Electronic Signature(s) Signed: 10/28/2021 10:53:36 AM By: Worthy Keeler PA-C Entered By: Worthy Keeler on 10/28/2021 10:53:36 -------------------------------------------------------------------------------- HPI Details Patient Name: Date of Service: Jose [name], Jose [name]. 10/28/2021 10:30 A M Medical Record Number: [id number] Patient Account Number: [id number] Date of Birth/Sex: Treating RN: [birth year] (46 y.o. [name] Primary Care Provider: [name] Other Clinician: Referring Provider: Treating Provider/Extender: Cristina Gong in Treatment: 5 History of Present Illness HPI Description: 09/23/2021 upon evaluation today patient presents for initial inspection here in the clinic concerning a wound that he has over the sacral region. He unfortunately has been experiencing issues with a pilonidal cyst that opened in September 2022. It was also noted in the end that he also had osteomyelitis in the sacral region. That [name] quite unfortunate he still on IV antibiotics for that his wife helps to manage this at home. This [name] a stage III pressure ulcer. Subsequently the patient also has diabetes mellitus type 2, generalized weakness, and again the history of pilonidal cyst in this area with abscess. He does have chronic kidney disease stage III. His most recent hemoglobin A1c was 7.1 at last check. He does not have home  health his wife [name] the one performing all the dressing changes currently they have been doing Santyl with Dakin's moistened gauze behind it 2 times a day. 10/07/2021 upon evaluation today patient appears to be doing well with regard to his wound in general. He has been tolerating the dressing changes without complication. In fact the Dakin's moistened gauze has been doing excellent I think based on what I am seeing and overall I am extremely pleased with where we stand I think he [name] in a good spot to begin with the wound VAC to be honest. 10/14/2021 upon evaluation today patient appears to be doing better in regard to his wound I am actually seeing signs of good improvement which [name] great news. Fortunately I do not see any evidence of active infection locally nor systemically at this time which [name] also great news. No fevers, chills, nausea, vomiting, or diarrhea. 10/28/2021 upon evaluation today patient appears to be doing well with regard to his wound. This [name] actually filled in significantly since I last saw him. Fortunately I do not see any signs of active infection locally nor systemically at this time. No fevers, chills, nausea, vomiting, or diarrhea. Electronic Signature(s) Signed: 10/28/2021 1:01:59 PM By: Worthy Keeler PA-C Entered By: Worthy Keeler on 10/28/2021 13:01:59 -------------------------------------------------------------------------------- Physical Exam Details Patient Name: Date of Service: Jose [name], Jose K. 10/28/2021 10:30 A M Medical Record Number: [id number] Patient Account Number: [id number] Date of Birth/Sex: Treating RN: 1976-10-14 (46 y.o. [name] Primary Care Provider: [name] Other Clinician: Referring Provider: Treating Provider/Extender: Cristina Gong in Treatment: 5 Constitutional Well-nourished and well-hydrated in no acute distress. Respiratory normal breathing without difficulty. Psychiatric this patient [name]  able to make decisions and demonstrates good insight into disease process. Alert and Oriented x 3. pleasant and cooperative. Notes Patient's  wound actually showed signs of some hypergranulation otherwise appears to be doing quite well and overall I am extremely pleased with where we stand currently. Electronic Signature(s) Signed: 10/28/2021 1:02:18 PM By: Worthy Keeler PA-C Entered By: Worthy Keeler on 10/28/2021 13:02:18 -------------------------------------------------------------------------------- Physician Orders Details Patient Name: Date of Service: Jose [name], Jose K. 10/28/2021 10:30 A M Medical Record Number: [id number] Patient Account Number: [id number] Date of Birth/Sex: Treating RN: October 30, 1975 (46 y.o. [name] Primary Care Provider: [name] Other Clinician: Referring Provider: Treating Provider/Extender: Cristina Gong in Treatment: 5 Verbal / Phone Orders: No Diagnosis Coding ICD-10 Coding Code Description L89.153 Pressure ulcer of sacral region, stage 3 M46.28 Osteomyelitis of vertebra, sacral and sacrococcygeal region E11.622 Type 2 diabetes mellitus with other skin ulcer M62.81 Muscle weakness (generalized) L05.01 Pilonidal cyst with abscess N18.30 Chronic kidney disease, stage 3 unspecified Follow-up Appointments ppointment in 2 weeks. Margarita Grizzle Return A Patient to call [facility] to pick up the wound vac system. Negative Presssure Wound Therapy Discontinue wound vac - Patient to call [facility] to pick up the wound vac system. Off-Loading Turn and reposition every 2 hours Other: - continue to use cushion when sitting. Wound Treatment Wound #1 - Sacrum Cleanser: Wound Cleanser 3 x Per Week/30 Days Discharge Instructions: Cleanse the wound with wound cleanser prior to applying a clean dressing using gauze sponges, not tissue or cotton balls. Peri-Wound Care: Skin Prep 3 x Per Week/30 Days Discharge Instructions: Use skin prep as  directed Prim Dressing: Hydrofera Blue Ready Foam, 2.5 x2.5 in 3 x Per Week/30 Days ary Discharge Instructions: Apply to wound bed as instructed Secondary Dressing: Woven Gauze Sponges 2x2 in 3 x Per Week/30 Days Discharge Instructions: Apply over hydrofera blue. Secondary Dressing: Zetuvit Plus Silicone Border Dressing 4x4 (in/in) 3 x Per Week/30 Days Discharge Instructions: Apply silicone border over primary dressing as directed. Electronic Signature(s) Signed: 10/28/2021 4:51:59 PM By: Worthy Keeler PA-C Signed: 10/28/2021 5:44:25 PM By: Deon Pilling RN, BSN Entered By: Deon Pilling on 10/28/2021 11:19:26 -------------------------------------------------------------------------------- Problem List Details Patient Name: Date of Service: Jose [name], Jose [name]. 10/28/2021 10:30 A M Medical Record Number: [id number] Patient Account Number: [id number] Date of Birth/Sex: Treating RN: [birth year] (46 y.o. [name] Primary Care Provider: [name] Other Clinician: Referring Provider: Treating Provider/Extender: Cristina Gong in Treatment: 5 Active Problems ICD-10 Encounter Code Description Active Date MDM Diagnosis L89.153 Pressure ulcer of sacral region, stage 3 09/23/2021 No Yes M46.28 Osteomyelitis of vertebra, sacral and sacrococcygeal region 09/23/2021 No Yes E11.622 Type 2 diabetes mellitus with other skin ulcer 09/23/2021 No Yes M62.81 Muscle weakness (generalized) 09/23/2021 No Yes L05.01 Pilonidal cyst with abscess 09/23/2021 No Yes N18.30 Chronic kidney disease, stage 3 unspecified 09/23/2021 No Yes Inactive Problems Resolved Problems Electronic Signature(s) Signed: 10/28/2021 10:53:20 AM By: Worthy Keeler PA-C Entered By: Worthy Keeler on 10/28/2021 10:53:20 -------------------------------------------------------------------------------- Progress Note Details Patient Name: Date of Service: Jose [name], Jose [name]. 10/28/2021 10:30 A  M Medical Record Number: [id number] Patient Account Number: [id number] Date of Birth/Sex: Treating RN: [birth year] (46 y.o. [name] Primary Care Provider: [name] Other Clinician: Referring Provider: Treating Provider/Extender: Cristina Gong in Treatment: 5 Subjective Chief Complaint Information obtained from Patient Sacral abscess with osteomyelitis History of Present Illness (HPI) 09/23/2021 upon evaluation today patient presents for initial inspection here in the clinic concerning a wound that he has over the sacral region. He  unfortunately has been experiencing issues with a pilonidal cyst that opened in September 2022. It was also noted in the end that he also had osteomyelitis in the sacral region. That [name] quite unfortunate he still on IV antibiotics for that his wife helps to manage this at home. This [name] a stage III pressure ulcer. Subsequently the patient also has diabetes mellitus type 2, generalized weakness, and again the history of pilonidal cyst in this area with abscess. He does have chronic kidney disease stage III. His most recent hemoglobin A1c was 7.1 at last check. He does not have home health his wife [name] the one performing all the dressing changes currently they have been doing Santyl with Dakin's moistened gauze behind it 2 times a day. 10/07/2021 upon evaluation today patient appears to be doing well with regard to his wound in general. He has been tolerating the dressing changes without complication. In fact the Dakin's moistened gauze has been doing excellent I think based on what I am seeing and overall I am extremely pleased with where we stand I think he [name] in a good spot to begin with the wound VAC to be honest. 10/14/2021 upon evaluation today patient appears to be doing better in regard to his wound I am actually seeing signs of good improvement which [name] great news. Fortunately I do not see any evidence of active infection  locally nor systemically at this time which [name] also great news. No fevers, chills, nausea, vomiting, or diarrhea. 10/28/2021 upon evaluation today patient appears to be doing well with regard to his wound. This [name] actually filled in significantly since I last saw him. Fortunately I do not see any signs of active infection locally nor systemically at this time. No fevers, chills, nausea, vomiting, or diarrhea. Objective Constitutional Well-nourished and well-hydrated in no acute distress. Vitals Time Taken: 10:31 AM, Height: 71 in, Weight: 180 lbs, BMI: 25.1, Temperature: 98.4 F, Pulse: 80 bpm, Respiratory Rate: 18 breaths/min, Blood Pressure: 159/64 mmHg. Respiratory normal breathing without difficulty. Psychiatric this patient [name] able to make decisions and demonstrates good insight into disease process. Alert and Oriented x 3. pleasant and cooperative. General Notes: Patient's wound actually showed signs of some hypergranulation otherwise appears to be doing quite well and overall I am extremely pleased with where we stand currently. Integumentary (Hair, Skin) Wound #1 status [name] Open. Original cause of wound was Pressure Injury. The date acquired was: 07/22/2021. The wound has been in treatment 5 weeks. The wound [name] located on the Sacrum. The wound measures 2.5cm length x 2.5cm width x 0.1cm depth; 4.909cm^2 area and 0.491cm^3 volume. There [name] Fat Layer (Subcutaneous Tissue) exposed. There [name] no tunneling or undermining noted. There [name] a medium amount of serosanguineous drainage noted. The wound margin [name] well defined and not attached to the wound base. There [name] large (67-100%) red, pink granulation within the wound bed. There [name] no necrotic tissue within the wound bed. Assessment Active Problems ICD-10 Pressure ulcer of sacral region, stage 3 Osteomyelitis of vertebra, sacral and sacrococcygeal region Type 2 diabetes mellitus with other skin ulcer Muscle weakness (generalized) Pilonidal  cyst with abscess Chronic kidney disease, stage 3 unspecified Plan Follow-up Appointments: Return Appointment in 2 weeks. Margarita Grizzle Patient to call [facility] to pick up the wound vac system. Negative Presssure Wound Therapy: Discontinue wound vac - Patient to call [facility] to pick up the wound vac system. Off-Loading: Turn and reposition every 2 hours Other: - continue to use cushion when sitting. WOUND #  1: - Sacrum Wound Laterality: Cleanser: Wound Cleanser 3 x Per Week/30 Days Discharge Instructions: Cleanse the wound with wound cleanser prior to applying a clean dressing using gauze sponges, not tissue or cotton balls. Peri-Wound Care: Skin Prep 3 x Per Week/30 Days Discharge Instructions: Use skin prep as directed Prim Dressing: Hydrofera Blue Ready Foam, 2.5 x2.5 in 3 x Per Week/30 Days ary Discharge Instructions: Apply to wound bed as instructed Secondary Dressing: Woven Gauze Sponges 2x2 in 3 x Per Week/30 Days Discharge Instructions: Apply over hydrofera blue. Secondary Dressing: Zetuvit Plus Silicone Border Dressing 4x4 (in/in) 3 x Per Week/30 Days Discharge Instructions: Apply silicone border over primary dressing as directed. 1. I would recommend currently that we discontinue wound VAC wraps to get a switch over to using the [facility] ready which I think will do a good job here. 2. I am also can recommend at this time that we have the patient utilize the small 2 x 2 gauze to put in behind in order to keep the [facility] in touch with the wound bed. 3. We will continue to cover with a Zetuvit border foam dressing or even some the left over draping from the wound VAC would be fine as well. We will see patient back for reevaluation in 2 weeks here in the clinic. If anything worsens or changes patient will contact our office for additional recommendations. Electronic Signature(s) Signed: 10/28/2021 1:03:02 PM By: Worthy Keeler PA-C Entered By: Worthy Keeler on 10/28/2021  13:03:02 -------------------------------------------------------------------------------- SuperBill Details Patient Name: Date of Service: Jose [name], Jose [name]. 10/28/2021 Medical Record Number: [id number] Patient Account Number: [id number] Date of Birth/Sex: Treating RN: 1976-06-06 (46 y.o. [name] Primary Care Provider: [name] Other Clinician: Referring Provider: Treating Provider/Extender: Cristina Gong in Treatment: 5 Diagnosis Coding ICD-10 Codes Code Description [phone] Pressure ulcer of sacral region, stage 3 M46.28 Osteomyelitis of vertebra, sacral and sacrococcygeal region E11.622 Type 2 diabetes mellitus with other skin ulcer M62.81 Muscle weakness (generalized) L05.01 Pilonidal cyst with abscess N18.30 Chronic kidney disease, stage 3 unspecified Facility Procedures CPT4 Code: [id number] Description: 99213 - WOUND CARE VISIT-LEV 3 EST PT Modifier: Quantity: 1 Physician Procedures : CPT4 Code Description Modifier [id number] 99214 - WC PHYS LEVEL 4 - EST PT ICD-10 Diagnosis Description M46.28 Osteomyelitis of vertebra, sacral and sacrococcygeal region L89.153 Pressure ulcer of sacral region, stage 3 E11.622 Type 2 diabetes mellitus  with other skin ulcer M62.81 Muscle weakness (generalized) Quantity: 1 Electronic Signature(s) Signed: 10/28/2021 4:51:59 PM By: Worthy Keeler PA-C Signed: 10/28/2021 5:44:25 PM By: Deon Pilling RN, BSN Previous Signature: 10/28/2021 1:03:22 PM Version By: Worthy Keeler PA-C Entered By: Deon Pilling on 10/28/2021 13:03:44

## 2021-10-28 NOTE — Progress Notes (Signed)
[name], PASSAGE ([id number]) Visit Report for 10/28/2021 Arrival Information Details Patient Name: Date of Service: Jose [name], Jose [name] 10/28/2021 10:30 A M Medical Record Number: [id number] Patient Account Number: [id number] Date of Birth/Sex: Treating RN: 04-14-1976 (46 y.o. [name] Primary Care : [name] Other Clinician: Referring : Treating /Extender: Cristina Gong in Treatment: 5 Visit Information History Since Last Visit Added or deleted any medications: No Patient Arrived: Wheel Chair Any new allergies or adverse reactions: No Arrival Time: 10:31 Had a fall or experienced change in No Accompanied By: wife activities of daily living that may affect Transfer Assistance: Manual risk of falls: Patient Identification Verified: Yes Signs or symptoms of abuse/neglect since last visito No Secondary Verification Process Completed: Yes Hospitalized since last visit: No Patient Requires Transmission-Based Precautions: No Implantable device outside of the clinic excluding No Patient Has Alerts: No cellular tissue based products placed in the center since last visit: Has Dressing in Place as Prescribed: Yes Pain Present Now: Yes Electronic Signature(s) Signed: 10/28/2021 10:36:58 AM By: Sandre Kitty Entered By: Sandre Kitty on 10/28/2021 10:31:36 -------------------------------------------------------------------------------- Clinic Level of Care Assessment Details Patient Name: Date of Service: Jose [name] 10/28/2021 10:30 A M Medical Record Number: [id number] Patient Account Number: [id number] Date of Birth/Sex: Treating RN: [birth year] (46 y.o. [name] Primary Care : [name] Other Clinician: Referring : Treating /Extender: Cristina Gong in Treatment: 5 Clinic Level of Care Assessment Items TOOL 4 Quantity Score X- 1 0 Use when only an  EandM is performed on FOLLOW-UP visit ASSESSMENTS - Nursing Assessment / Reassessment X- 1 10 Reassessment of Co-morbidities (includes updates in patient status) X- 1 5 Reassessment of Adherence to Treatment Plan ASSESSMENTS - Wound and Skin A ssessment / Reassessment X - Simple Wound Assessment / Reassessment - one wound 1 5 [] - 0 Complex Wound Assessment / Reassessment - multiple wounds X- 1 10 Dermatologic / Skin Assessment (not related to wound area) ASSESSMENTS - Focused Assessment [] - 0 Circumferential Edema Measurements - multi extremities X- 1 10 Nutritional Assessment / Counseling / Intervention [] - 0 Lower Extremity Assessment (monofilament, tuning fork, pulses) [] - 0 Peripheral Arterial Disease Assessment (using hand held doppler) ASSESSMENTS - Ostomy and/or Continence Assessment and Care [] - 0 Incontinence Assessment and Management [] - 0 Ostomy Care Assessment and Management (repouching, etc.) PROCESS - Coordination of Care X - Simple Patient / Family Education for ongoing care 1 15 [] - 0 Complex (extensive) Patient / Family Education for ongoing care X- 1 10 Staff obtains Programmer, systems, Records, T Results / Process Orders est [] - 0 Staff telephones HHA, Nursing Homes / Clarify orders / etc [] - 0 Routine Transfer to another Facility (non-emergent condition) [] - 0 Routine Hospital Admission (non-emergent condition) [] - 0 New Admissions / Biomedical engineer / Ordering NPWT Apligraf, etc. , [] - 0 Emergency Hospital Admission (emergent condition) X- 1 10 Simple Discharge Coordination [] - 0 Complex (extensive) Discharge Coordination PROCESS - Special Needs [] - 0 Pediatric / Minor Patient Management [] - 0 Isolation Patient Management [] - 0 Hearing / Language / Visual special needs [] - 0 Assessment of Community assistance (transportation, D/C planning, etc.) [] - 0 Additional assistance / Altered mentation [] - 0 Support Surface(s)  Assessment (bed, cushion, seat, etc.) INTERVENTIONS - Wound Cleansing / Measurement X - Simple Wound Cleansing - one wound 1 5 [] - 0 Complex  Wound Cleansing - multiple wounds X- 1 5 Wound Imaging (photographs - any number of wounds) [] - 0 Wound Tracing (instead of photographs) X- 1 5 Simple Wound Measurement - one wound [] - 0 Complex Wound Measurement - multiple wounds INTERVENTIONS - Wound Dressings X - Small Wound Dressing one or multiple wounds 1 10 [] - 0 Medium Wound Dressing one or multiple wounds [] - 0 Large Wound Dressing one or multiple wounds [] - 0 Application of Medications - topical [] - 0 Application of Medications - injection INTERVENTIONS - Miscellaneous [] - 0 External ear exam [] - 0 Specimen Collection (cultures, biopsies, blood, body fluids, etc.) [] - 0 Specimen(s) / Culture(s) sent or taken to Lab for analysis [] - 0 Patient Transfer (multiple staff / Civil Service fast streamer / Similar devices) [] - 0 Simple Staple / Suture removal (25 or less) [] - 0 Complex Staple / Suture removal (26 or more) [] - 0 Hypo / Hyperglycemic Management (close monitor of Blood Glucose) [] - 0 Ankle / Brachial Index (ABI) - do not check if billed separately X- 1 5 Vital Signs Has the patient been seen at the hospital within the last three years: Yes Total Score: 105 Level Of Care: New/Established - Level 3 Electronic Signature(s) Signed: 10/28/2021 5:44:25 PM By: Deon Pilling RN, BSN Entered By: Deon Pilling on 10/28/2021 13:03:33 -------------------------------------------------------------------------------- Encounter Discharge Information Details Patient Name: Date of Service: Jose Jearld Shines K. 10/28/2021 10:30 A M Medical Record Number: [id number] Patient Account Number: [id number] Date of Birth/Sex: Treating RN: [birth year] (46 y.o. [name] Primary Care : [name] Other Clinician: Referring : Treating /Extender: Cristina Gong in Treatment: 5 Encounter Discharge Information Items Discharge Condition: Stable Ambulatory Status: Wheelchair Discharge Destination: Home Transportation: Private Auto Accompanied By: girlfriend Schedule Follow-up Appointment: Yes Clinical Summary of Care: Electronic Signature(s) Signed: 10/28/2021 5:44:25 PM By: Deon Pilling RN, BSN Entered By: Deon Pilling on 10/28/2021 13:05:07 -------------------------------------------------------------------------------- Lower Extremity Assessment Details Patient Name: Date of Service: Jose V IS, Ysidro Evert K. 10/28/2021 10:30 A M Medical Record Number: [id number] Patient Account Number: [id number] Date of Birth/Sex: Treating RN: 09/04/1976 (46 y.o. [name] Primary Care : [name] Other Clinician: Referring : Treating /Extender: Cristina Gong in Treatment: 5 Electronic Signature(s) Signed: 10/28/2021 5:44:25 PM By: Deon Pilling RN, BSN Entered By: Deon Pilling on 10/28/2021 10:41:57 -------------------------------------------------------------------------------- [place] Details Patient Name: Date of Service: Jose V IS, Hoyt Koch. 10/28/2021 10:30 A M Medical Record Number: [id number] Patient Account Number: [id number] Date of Birth/Sex: Treating RN: February 04, 1976 (46 y.o. [name] Primary Care : [name] Other Clinician: Referring : Treating /Extender: Cristina Gong in Treatment: 5 Active Inactive Osteomyelitis Nursing Diagnoses: Infection: osteomyelitis Goals: Patient/caregiver will verbalize understanding of disease process and disease management Date Initiated: 09/23/2021 Target Resolution Date: 11/12/2021 Goal Status: Active Interventions: Assess for signs and symptoms of osteomyelitis resolution every visit Provide education on osteomyelitis Notes: Wound/Skin  Impairment Nursing Diagnoses: Impaired tissue integrity Goals: Patient/caregiver will verbalize understanding of skin care regimen Date Initiated: 09/23/2021 Target Resolution Date: 11/11/2021 Goal Status: Active Ulcer/skin breakdown will have a volume reduction of 30% by week 4 Date Initiated: 09/23/2021 Date Inactivated: 10/28/2021 Target Resolution Date: 10/21/2021 Goal Status: Met Interventions: Assess patient/caregiver ability to obtain necessary supplies Assess patient/caregiver ability to perform ulcer/skin care regimen upon admission and as needed Assess ulceration(s) every visit Provide education on ulcer and skin  care Treatment Activities: Topical wound management initiated : 09/23/2021 Notes: Electronic Signature(s) Signed: 10/28/2021 5:44:25 PM By: Deon Pilling RN, BSN Entered By: Deon Pilling on 10/28/2021 10:42:40 -------------------------------------------------------------------------------- Pain Assessment Details Patient Name: Date of Service: Jose Cristal Deer. 10/28/2021 10:30 A M Medical Record Number: [id number] Patient Account Number: [id number] Date of Birth/Sex: Treating RN: [birth year] (46 y.o. [name] Primary Care Akiva Brassfield: [name] Other Clinician: Referring Lashayla Armes: Treating Ainslie Mazurek/Extender: Cristina Gong in Treatment: 5 Active Problems Location of Pain Severity and Description of Pain Patient Has Paino Yes Site Locations Rate the pain. Current Pain Level: 4 Pain Management and Medication Current Pain Management: Electronic Signature(s) Signed: 10/28/2021 10:36:58 AM By: Sandre Kitty Signed: 10/28/2021 5:43:10 PM By: Baruch Gouty RN, BSN Entered By: Sandre Kitty on 10/28/2021 10:32:08 -------------------------------------------------------------------------------- Patient/Caregiver Education Details Patient Name: Date of Service: Jose Cristal Deer 1/4/2023andnbsp10:30 A M Medical Record  Number: [id number] Patient Account Number: [id number] Date of Birth/Gender: Treating RN: 1976/02/06 (46 y.o. [name] Primary Care Physician: [name] Other Clinician: Referring Physician: Treating Physician/Extender: Cristina Gong in Treatment: 5 Education Assessment Education Provided To: Patient Education Topics Provided Wound/Skin Impairment: Handouts: Skin Care Do's and Dont's Methods: Explain/Verbal Responses: Reinforcements needed Electronic Signature(s) Signed: 10/28/2021 5:44:25 PM By: Deon Pilling RN, BSN Entered By: Deon Pilling on 10/28/2021 10:42:59 -------------------------------------------------------------------------------- Wound Assessment Details Patient Name: Date of Service: Jose Cristal Deer. 10/28/2021 10:30 A M Medical Record Number: [id number] Patient Account Number: [id number] Date of Birth/Sex: Treating RN: [birth year] (46 y.o. [name] Primary Care Audra Bellard: [name] Other Clinician: Referring Yukari Flax: Treating Gaje Tennyson/Extender: Cristina Gong in Treatment: 5 Wound Status Wound Number: 1 Primary Etiology: Pressure Ulcer Wound Location: Sacrum Wound Status: Open Wounding Event: Pressure Injury Comorbid Hypertension, Type II Diabetes, Osteomyelitis, History: Neuropathy Date Acquired: 07/22/2021 Weeks Of Treatment: 5 Clustered Wound: No Wound Measurements Length: (cm) 2.5 Width: (cm) 2.5 Depth: (cm) 0.1 Area: (cm) 4.909 Volume: (cm) 0.491 % Reduction in Area: 65.3% % Reduction in Volume: 98.6% Epithelialization: Large (67-100%) Tunneling: No Undermining: No Wound Description Classification: Category/Stage III Wound Margin: Well defined, not attached Exudate Amount: Medium Exudate Type: Serosanguineous Exudate Color: red, brown Foul Odor After Cleansing: No Slough/Fibrino No Wound Bed Granulation Amount: Large (67-100%) Exposed Structure Granulation  Quality: Red, Pink Fascia Exposed: No Necrotic Amount: None Present (0%) Fat Layer (Subcutaneous Tissue) Exposed: Yes Tendon Exposed: No Muscle Exposed: No Joint Exposed: No Bone Exposed: No Treatment Notes Wound #1 (Sacrum) Cleanser Wound Cleanser Discharge Instruction: Cleanse the wound with wound cleanser prior to applying a clean dressing using gauze sponges, not tissue or cotton balls. Peri-Wound Care Skin Prep Discharge Instruction: Use skin prep as directed Topical Primary Dressing Hydrofera Blue Ready Foam, 2.5 x2.5 in Discharge Instruction: Apply to wound bed as instructed Secondary Dressing Woven Gauze Sponges 2x2 in Discharge Instruction: Apply over hydrofera blue. Zetuvit Plus Silicone Border Dressing 4x4 (in/in) Discharge Instruction: Apply silicone border over primary dressing as directed. Secured With Compression Wrap Compression Stockings Environmental education officer) Signed: 10/28/2021 5:43:10 PM By: Baruch Gouty RN, BSN Signed: 10/28/2021 5:44:25 PM By: Deon Pilling RN, BSN Previous Signature: 10/28/2021 10:36:58 AM Version By: Sandre Kitty Entered By: Deon Pilling on 10/28/2021 10:42:12 -------------------------------------------------------------------------------- Vitals Details Patient Name: Date of Service: Jose V IS, Ysidro Evert K. 10/28/2021 10:30 A M Medical Record Number: [id number] Patient Account Number: [id number] Date of Birth/Sex: Treating RN: [birth year] (46 y.o. [name]  Primary Care Vergia Chea: [name] Other Clinician: Referring Fermina Mishkin: Treating Avyana Puffenbarger/Extender: Cristina Gong in Treatment: 5 Vital Signs Time Taken: 10:31 Temperature (F): 98.4 Height (in): 71 Pulse (bpm): 80 Weight (lbs): 180 Respiratory Rate (breaths/min): 18 Body Mass Index (BMI): 25.1 Blood Pressure (mmHg): 159/64 Reference Range: 80 - 120 mg / dl Electronic Signature(s) Signed: 10/28/2021 10:36:58 AM By: Sandre Kitty Entered By: Sandre Kitty on 10/28/2021 10:31:59

## 2021-10-29 ENCOUNTER — Other Ambulatory Visit: Payer: Self-pay

## 2021-10-29 ENCOUNTER — Ambulatory Visit (INDEPENDENT_AMBULATORY_CARE_PROVIDER_SITE_OTHER): Payer: 59 | Admitting: Infectious Diseases

## 2021-10-29 ENCOUNTER — Encounter: Payer: Self-pay | Admitting: Infectious Diseases

## 2021-10-29 VITALS — BP 164/90 | HR 82 | Temp 98.4°F

## 2021-10-29 DIAGNOSIS — M4624 Osteomyelitis of vertebra, thoracic region: Secondary | ICD-10-CM | POA: Diagnosis not present

## 2021-10-29 DIAGNOSIS — S31000D Unspecified open wound of lower back and pelvis without penetration into retroperitoneum, subsequent encounter: Secondary | ICD-10-CM | POA: Diagnosis not present

## 2021-10-29 DIAGNOSIS — Z5181 Encounter for therapeutic drug level monitoring: Secondary | ICD-10-CM

## 2021-10-29 DIAGNOSIS — Z452 Encounter for adjustment and management of vascular access device: Secondary | ICD-10-CM | POA: Insufficient documentation

## 2021-10-29 NOTE — Progress Notes (Addendum)
Patient Active Problem List   Diagnosis Date Noted   Medication monitoring encounter 09/30/2021   GIB (gastrointestinal bleeding) 09/21/2021   Duodenal ulcer 09/21/2021   Esophagitis 09/21/2021   Decubitus ulcer of sacral region, unstageable ([place]) 09/21/2021   Osteomyelitis of sacrum ([place]) 09/21/2021   Hyponatremia    AKI (acute kidney injury) ([place])    Acute blood loss anemia    Labile blood glucose    Thoracic spondylosis with myelopathy 09/02/2021   Debility 08/30/2021   Osteomyelitis of thoracic region ([place]) 08/06/2021   Essential hypertension 08/06/2021   CKD (chronic kidney disease) stage 3, GFR 30-59 ml/min (HCC) 08/06/2021   Pleural effusion, bilateral 08/06/2021   Diabetic ulcer of left great toe ([place])    MSSA bacteremia 07/23/2021   Constipation 07/23/2021   Lytic bone lesions on xray 07/22/2021   Anemia 05/19/2021   Nausea and vomiting 05/19/2021   Elevated blood pressure reading 05/19/2021   Bilateral carpal tunnel syndrome 01/23/2020   Numbness 12/12/2019   Microalbuminuria due to type 2 diabetes mellitus ([place]) 07/20/2019   GAD (generalized anxiety disorder) 06/18/2019   Adult ADHD 06/18/2019   Trigger middle finger of left hand 06/11/2019   Diabetic retinopathy ([place]) 07/13/2018   Type 2 diabetes mellitus with diabetic polyneuropathy, without long-term current use of insulin ([place]) 05/17/2018   Tobacco use disorder 05/17/2018   Hyperlipidemia 05/17/2018   Current Outpatient Medications on File Prior to Visit  Medication Sig Dispense Refill   acetaminophen (TYLENOL) 325 MG tablet Take 1-2 tablets (325-650 mg total) by mouth every 4 (four) hours as needed for mild pain.     albuterol (VENTOLIN HFA) 108 (90 Base) MCG/ACT inhaler Inhale 1 puff into the lungs every 4 (four) hours as needed for wheezing or shortness of breath.     ALPRAZolam (XANAX) 0.25 MG tablet Take 1 tablet (0.25 mg total) by mouth 2 (two) times daily as needed for anxiety. 30 tablet 0    amLODipine (NORVASC) 5 MG tablet Take 5 mg by mouth daily.     atorvastatin (LIPITOR) 40 MG tablet Take 1 tablet by mouth daily.     bethanechol (URECHOLINE) 10 MG tablet Take 1 tablet (10 mg total) by mouth 3 (three) times daily. 90 tablet 0   bisacodyl (DULCOLAX) 10 MG suppository Place 1 suppository (10 mg total) rectally daily as needed for moderate constipation. 12 suppository 0   cholecalciferol (VITAMIN D3) 25 MCG (1000 UNIT) tablet 1 tablet     collagenase (SANTYL) ointment Apply topically daily. 30 g 0   Dulaglutide (TRULICITY) 2.84 XL/2.4MW SOPN Inject 0.75 mg into the skin once a week. 6 mL 3   gabapentin (NEURONTIN) 100 MG capsule Take 2 capsules (200 mg total) by mouth 2 (two) times daily. 120 capsule 0   hydrOXYzine (ATARAX/VISTARIL) 10 MG tablet Take 1 tablet (10 mg total) by mouth 3 (three) times daily as needed for anxiety. 60 tablet 0   Insulin Pen Needle (PENTIPS) 32G X 4 MM MISC use as directed 100 each 1   lidocaine (XYLOCAINE) 2 % jelly Place 1 application into the urethra every 6 (six) hours. 30 mL 1   methocarbamol (ROBAXIN) 500 MG tablet Take 1 tablet (500 mg total) by mouth every 6 (six) hours as needed for muscle spasms. 60 tablet 0   metoprolol tartrate (LOPRESSOR) 50 MG tablet Take 1 tablet (50 mg total) by mouth 2 (two) times daily. 60 tablet 0   mirtazapine (REMERON) 15 MG tablet  Take 1 tablet (15 mg total) by mouth at bedtime. 30 tablet 0   morphine (MS CONTIN) 15 MG 12 hr tablet Take 1 tablet (15 mg total) by mouth every 12 (twelve) hours. Do Not Fill Before 10/25/2021 60 tablet 0   Multiple Vitamin (MULTIVITAMIN ADULT PO) Take 1 tablet by mouth daily.     mupirocin ointment (BACTROBAN) 2 % Apply 1 application topically 2 (two) times daily. 66 g 0   naloxone (NARCAN) nasal spray 4 mg/0.1 mL Use as needed for signs of overdose. 2 each 1   nutrition supplement, JUVEN, (JUVEN) PACK Take 1 packet by mouth 2 (two) times daily between meals. 60 packet 1    oxyCODONE-acetaminophen (PERCOCET) 10-325 MG tablet Take 1 tablet by mouth every 6 (six) hours as needed for pain. Can fill 09/28/21 120 tablet 0   pantoprazole (PROTONIX) 40 MG tablet Take 1 tablet (40 mg total) by mouth 2 (two) times daily. 60 tablet 0   PARoxetine (PAXIL) 20 MG tablet Take 1 tablet (20 mg total) by mouth at bedtime. 30 tablet 0   polyethylene glycol (MIRALAX / GLYCOLAX) 17 g packet Take 17 g by mouth daily as needed for moderate constipation, mild constipation or severe constipation. 14 each 0   senna-docusate (SENOKOT-S) 8.6-50 MG tablet Take 2 tablets by mouth at bedtime as needed for mild constipation. 60 tablet 0   sucralfate (CARAFATE) 1 GM/10ML suspension Take 10 mLs (1 g total) by mouth 4 (four) times daily -  with meals and at bedtime. 420 mL 0   tamsulosin (FLOMAX) 0.4 MG CAPS capsule Take 1 capsule (0.4 mg total) by mouth daily after supper. 30 capsule 0   TRULICITY 1.5 FB/9.0XY SOPN Inject 1.5 mg into the skin once a week.     No current facility-administered medications on file prior to visit.    Subjective: Here for follow up in setting of thoracic discitis and osteomyelitis and Decubitus sacral ulcer. Accompanied by his wife. Getting IV cefazolin through RT arm PICC without issues. No concerns with the IV antibiotics. Discussed MRI T spine findings with patient and wife. They have contacted DR Nundkumar's office but have not heard from them. I called Dr Cleotilde Neer office and spoke with the staff who said that MRI T spine 10/27/21 has already been reviewed by Dr Kathyrn Sheriff and has advised to keep the appt on 11/02/21.   Back pain is very minimal, feels like an ache. He is able to ambulate with the help of walker. He does not have new weakness, numbness and tingling of his upper and lower extremities. He actually feels getting better and stronger. Discussed plan to continue IV cefazolin for 3 weeks from 10/30/21 and fu prior to end date of abtx after being seen by  Neurosurgery.  Also following with wound care for sacral wound which is healing well. Sacral wound has pink granulation tissue when picture taken from yesterday from wife's phone was seen  Review of Systems: ROS negative for fevers, chills and diarrhea  All other systems are reviewed and negative except as above  Past Medical History:  Diagnosis Date   AKI (acute kidney injury) ([place])    GAD (generalized anxiety disorder)    Hyperlipidemia    Macular degeneration, bilateral    Retinopathy    Type II diabetes mellitus with complication, uncontrolled    retinopathy, neuropathy, microalbuminuria   Past Surgical History:  Procedure Laterality Date   APPENDECTOMY     BIOPSY  08/10/2021   Procedure: BIOPSY;  Surgeon: Otis Brace, MD;  Location: [place];  Service: Gastroenterology;;   BUBBLE STUDY  07/29/2021   Procedure: BUBBLE STUDY;  Surgeon: Jerline Pain, MD;  Location: [facility] ENDOSCOPY;  Service: Cardiovascular;;   ESOPHAGOGASTRODUODENOSCOPY (EGD) WITH PROPOFOL N/A 08/10/2021   Procedure: ESOPHAGOGASTRODUODENOSCOPY (EGD) WITH PROPOFOL;  Surgeon: Otis Brace, MD;  Location: [place];  Service: Gastroenterology;  Laterality: N/A;   ESOPHAGOGASTRODUODENOSCOPY (EGD) WITH PROPOFOL N/A 08/24/2021   Procedure: ESOPHAGOGASTRODUODENOSCOPY (EGD) WITH PROPOFOL;  Surgeon: Otis Brace, MD;  Location: [place];  Service: Gastroenterology;  Laterality: N/A;   HERNIA REPAIR     IR FLUORO GUIDED NEEDLE PLC ASPIRATION/INJECTION LOC  07/28/2021   LUMBAR LAMINECTOMY/DECOMPRESSION MICRODISCECTOMY N/A 08/07/2021   Procedure: THORACIC FOUR - THORACIC FIVE LAMINECTOMY/DECOMPRESSION OF SPINAL CORD, DEBRIDEMENT OF ABSCESS, MICRODISCECTOMY, INTRAOPERATIVE ULTRASOUND;  Surgeon: Consuella Lose, MD;  Location: [place];  Service: Neurosurgery;  Laterality: N/A;   TEE WITHOUT CARDIOVERSION N/A 07/29/2021   Procedure: TRANSESOPHAGEAL ECHOCARDIOGRAM (TEE);  Surgeon: Jerline Pain, MD;   Location: [facility] ENDOSCOPY;  Service: Cardiovascular;  Laterality: N/A;   TRIGGER FINGER RELEASE Right 10/25/2019   Procedure: RIGHT INDEX FINGER RELEASE TRIGGER FINGER/A-1 PULLEY;  Surgeon: Daryll Brod, MD;  Location: [place];  Service: Orthopedics;  Laterality: Right;  IV REGIONAL FOREARM BLOCK    Social History   Tobacco Use   Smoking status: Former    Packs/day: 1.00    Years: 20.00    Pack years: 20.00    Types: Cigarettes    Quit date: 07/2020    Years since quitting: 1.2   Smokeless tobacco: Never  Vaping Use   Vaping Use: Never used  Substance Use Topics   Alcohol use: Not Currently   Drug use: Never    Family History  Problem Relation Age of Onset   Diabetes Mother    Hyperlipidemia Mother    Stroke Mother    Diabetes Father    Hyperlipidemia Brother    Stroke Brother    ADD / ADHD Brother    ADD / ADHD Son     Allergies  Allergen Reactions   Cranberry Itching   Hm Lidocaine Patch [Lidocaine] Dermatitis    Blisters skin    Melatonin Other (See Comments)    nightmares    Health Maintenance  Topic Date Due   COLONOSCOPY (Pts 45-34yr Insurance coverage will need to be confirmed)  Never done   Pneumococcal Vaccine [id number] old (2 - PCV) 07/12/2019   URINE MICROALBUMIN  04/11/2020   COVID-19 Vaccine (3 - Booster for Pfizer series) 04/17/2020   TETANUS/TDAP  10/25/2020   OPHTHALMOLOGY EXAM  11/20/2020   INFLUENZA VACCINE  05/25/2021   HEMOGLOBIN A1C  12/12/2021   FOOT EXAM  06/11/2022   Hepatitis C Screening  Completed   HIV Screening  Completed   HPV VACCINES  Aged Out    Objective: BP (!) 164/90    Pulse 82    Temp 98.4 F (36.9 C) (Oral)    Physical Exam Constitutional:      Appearance: Normal appearance. Wheelchair  HENT:     Head: Normocephalic and atraumatic.      Mouth: Mucous membranes are moist.  Eyes:    Conjunctiva/sclera: Conjunctivae normal.     Pupils: Pupils are equal, round, and reactive to light.    Cardiovascular:     Rate and Rhythm: Normal rate and regular rhythm.     Heart sounds: No murmur heard.  Pulmonary:     Effort: Pulmonary effort is  normal.     Breath sounds: Normal breath sounds.   Abdominal:     General: Abdomen is flat.     Palpations: Abdomen is soft.   Musculoskeletal:        General: Normal range of motion.   Skin:    General: Skin is warm and dry.     Comments:  Sacral Ulcer  osterior thoracic surgical site   Posterior thoracic surgical site    Neurological:     General: Grossly non focal , Power 4/5 in bilateral upper and lower extremities     Mental Status: awake, alert and oriented to person, place, and time.   Psychiatric:        Mood and Affect: Mood normal. Calm and cooperative   Lab Results Lab Results  Component Value Date   WBC 7.5 09/21/2021   HGB 9.8 (L) 09/21/2021   HCT 31.5 (L) 09/21/2021   MCV 88.0 09/21/2021   PLT 304 09/21/2021    Lab Results  Component Value Date   CREATININE 1.36 (H) 09/21/2021   BUN 18 09/21/2021   NA 132 (L) 09/21/2021   K 3.7 09/21/2021   CL 99 09/21/2021   CO2 25 09/21/2021    Lab Results  Component Value Date   ALT 13 08/31/2021   AST 30 08/31/2021   ALKPHOS 958 (H) 08/31/2021   BILITOT 2.0 (H) 08/31/2021    Lab Results  Component Value Date   CHOL 116 11/22/2019   HDL 42 11/22/2019   LDLCALC 57 11/22/2019   TRIG 174 (H) 08/17/2021   CHOLHDL 2.8 11/22/2019   Lab Results  Component Value Date   LABRPR Non Reactive 05/05/2018   No results found for: HIV1RNAQUANT, HIV1RNAVL, CD4TABS  Imaging MR THORACIC SPINE W WO CONTRAST  Result Date: 10/27/2021 CLINICAL DATA:  46 year old male with a history of bacteremia and T4-T5 discitis osteomyelitis in October status post laminectomy, decompression. EXAM: MRI THORACIC WITHOUT AND WITH CONTRAST TECHNIQUE: Multiplanar and multiecho pulse sequences of the thoracic spine were obtained without and with intravenous contrast. CONTRAST:  16m  GADAVIST GADOBUTROL 1 MMOL/ML IV SOLN COMPARISON:  Preoperative thoracic spine MRI 08/06/2021 and earlier. Chest CT 09/08/2021. FINDINGS: Limited cervical spine imaging: Stable, no definite acute process in the cervical spine. Thoracic spine segmentation: Normal on the November CT, the same numbering system used on the October MRI. Alignment: Overall thoracic kyphosis has not significantly changed since October. No significant spondylolisthesis, with some metal susceptibility artifact at T4-T5. Vertebrae: Subtotal collapse of abnormally eroded and enhancing T5 vertebral body. Moderate similar erosion of the T4 body. Both levels demonstrate confluent marrow edema and enhancement, which also involves the bilateral posterior 5th ribs and costovertebral junctions. Mild superimposed hardware susceptibility artifact in the region. The T3 vertebra remains spared. There is new patchy T6 anterior superior endplate marrow edema and enhancement on the right (series 17, image 6), although that disc space remains within normal limits. Postoperative changes to the T4 and T5 posterior elements also with marrow edema in the right T5-T6 facets. Other thoracic levels remain intact without marrow edema. Cord: Susceptibility artifact at T4-T5 limits spinal cord detail there. But there does appear to be spinal stenosis and at least mild spinal cord mass effect related to retropulsion of the T5 body as seen on series 22, image 8. Difficult to exclude abnormal cord signal there, although there is no abnormal intradural enhancement. Above and below T4-T5 cord signal and morphology remains within normal limits. No significant dural thickening. Conus medullaris  at T12-L1. Paraspinal and other soft tissues: Postoperative changes to the paraspinal soft tissues at T4 and T5, see details below. Elsewhere visible chest, upper abdominal viscera, and paraspinal soft tissues remain within normal limits. Disc levels: T1-T2 through T3-T4: Negative.  T4-T5: Obliterated disc space and partial collapse of the vertebral bodies, especially T5, as above. Residual circumferential paraspinal phlegmon (series 21, image 21) with postoperative changes to the lamina here. Moderate to severe bilateral T4 neural foraminal stenosis. Retropulsion of T5 resulting in spinal stenosis and at least mild spinal cord mass effect. No paraspinal fluid collection. T5-T6: Relatively maintained disc space but moderate to severe bilateral T5 neural foraminal stenosis related to the T5 vertebral body collapse. No spinal stenosis at this level. T6-T7 through T11-T12: Negative aside from subtle disc bulge or protrusion at T8-T9. IMPRESSION: 1. Discitis Osteomyelitis sequelae with subtotal collapse of T5 and further erosion of the inferior T4 body since the preoperative MRI in October. Posterior decompression, but retropulsion of T5 results in spinal stenosis with at least mild cord mass effect. Postoperative susceptibility artifact affects spinal cord detail, but the appearance now is suspicious for new cord edema or myelomalacia at that level. 2. Ongoing circumferential paraspinal phlegmon at T4-T5, but no drainable fluid collection. 3. Mild new edema in the T6 anterior superior endplate and right side T5-T6 posterior elements, but favor stress reaction due to abnormal biomechanics rather than extension of infection. Electronically Signed   By: Genevie Ann M.D.   On: 10/27/2021 11:07    Problem List Items Addressed This Visit       Musculoskeletal and Integument   Osteomyelitis of thoracic region [facility]) - Primary   Relevant Orders   C-reactive protein   Sedimentation rate     Other   Medication monitoring encounter   Sacral wound, subsequent encounter   PICC (peripherally inserted central catheter) in place   Assessment/Plan Thoracic discitis and Osteomyelitis  Decubitus Sacral Ulcer   Medication Monitoring 10/12/21 cr 1.54  PICC - no issues   MRI T spine 1/3 findings  with subtotal collapse of T5 and further erosion of  the inferior T4 body/mild cord mass effect with new cord edema or myelomalacia at that level. Ongoing circumferential paraspinal phlegmon at T4-T5 with no drainable fluid collection/Mild new edema in the T6 anterior superior end plate and rt E3-M6 posterior elements which favors stress reaction due to abnormal biomechanics.   MRI T spine findings are concerning for possible ongoing infection however, patient continues to improve clinically. He has been on IV cefazolin since early October, approxmately 3 months now which makes me consider possible resistance with cefazolin if infection is truly progressing or another process going on.   I will continue cefazolin for now given he is clinically improving, plan for 3 weeks pending Neurosurgery evaluation on 11/02/21 I will check ESR and CRP today Fu in 2-3 weeks  Fu with wound care  Advised patient to keep Neurosurgery appt on 11/02/21  I have personally spent 40 minutes involved in face-to-face and non-face-to-face activities for this patient on the day of the visit. Professional time spent includes the following activities: Preparing to see the patient (review of tests), Obtaining and/or reviewing separately obtained history (admission/discharge record), Performing a medically appropriate examination and/or evaluation , Ordering medications/tests/procedures, referring and communicating with other health care professionals, Documenting clinical information in the EMR, Independently interpreting results (not separately reported), Communicating results to the patient/family/caregiver, Counseling and educating the patient/family/caregiver and Care coordination (not separately reported).  Wilber Oliphant, [facility] 10/29/2021, 10:32 AM

## 2021-10-30 ENCOUNTER — Encounter (HOSPITAL_BASED_OUTPATIENT_CLINIC_OR_DEPARTMENT_OTHER): Payer: 59 | Admitting: Internal Medicine

## 2021-10-30 LAB — C-REACTIVE PROTEIN: CRP: 7.2 mg/L (ref <8.0)

## 2021-10-30 LAB — SEDIMENTATION RATE: Sed Rate: 63 mm/h — ABNORMAL HIGH (ref 0–15)

## 2021-11-04 ENCOUNTER — Encounter (HOSPITAL_BASED_OUTPATIENT_CLINIC_OR_DEPARTMENT_OTHER): Payer: 59 | Admitting: Physician Assistant

## 2021-11-09 ENCOUNTER — Encounter: Payer: Self-pay | Admitting: Infectious Diseases

## 2021-11-10 ENCOUNTER — Other Ambulatory Visit: Payer: Self-pay | Admitting: Infectious Diseases

## 2021-11-10 ENCOUNTER — Other Ambulatory Visit: Payer: 59

## 2021-11-10 ENCOUNTER — Telehealth: Payer: Self-pay

## 2021-11-10 ENCOUNTER — Other Ambulatory Visit: Payer: Self-pay

## 2021-11-10 ENCOUNTER — Telehealth (INDEPENDENT_AMBULATORY_CARE_PROVIDER_SITE_OTHER): Payer: 59 | Admitting: Infectious Diseases

## 2021-11-10 ENCOUNTER — Encounter: Payer: Self-pay | Admitting: Infectious Diseases

## 2021-11-10 DIAGNOSIS — Z5181 Encounter for therapeutic drug level monitoring: Secondary | ICD-10-CM

## 2021-11-10 DIAGNOSIS — M8448XS Pathological fracture, other site, sequela: Secondary | ICD-10-CM

## 2021-11-10 DIAGNOSIS — Z452 Encounter for adjustment and management of vascular access device: Secondary | ICD-10-CM

## 2021-11-10 DIAGNOSIS — N189 Chronic kidney disease, unspecified: Secondary | ICD-10-CM | POA: Insufficient documentation

## 2021-11-10 DIAGNOSIS — E875 Hyperkalemia: Secondary | ICD-10-CM

## 2021-11-10 DIAGNOSIS — M4624 Osteomyelitis of vertebra, thoracic region: Secondary | ICD-10-CM

## 2021-11-10 LAB — BASIC METABOLIC PANEL
Anion gap: 7 (ref 5–15)
BUN: 40 mg/dL — ABNORMAL HIGH (ref 6–20)
CO2: 25 mmol/L (ref 22–32)
Calcium: 9.2 mg/dL (ref 8.9–10.3)
Chloride: 101 mmol/L (ref 98–111)
Creatinine, Ser: 2.21 mg/dL — ABNORMAL HIGH (ref 0.61–1.24)
GFR, Estimated: 36 mL/min — ABNORMAL LOW (ref >60)
Glucose, Bld: 230 mg/dL — ABNORMAL HIGH (ref 70–99)
Potassium: 5.8 mmol/L — ABNORMAL HIGH (ref 3.5–5.1)
Sodium: 133 mmol/L — ABNORMAL LOW (ref 135–145)

## 2021-11-10 MED ORDER — CEPHALEXIN 500 MG PO CAPS: 500.0000 mg | ORAL_CAPSULE | Freq: Three times a day (TID) | ORAL | 2 refills | Status: AC

## 2021-11-10 NOTE — Telephone Encounter (Signed)
Left voicemail asking patient to return my call. Will also send a message to patient my chart.    [place], CMA

## 2021-11-10 NOTE — Progress Notes (Addendum)
Virtual Visit via Video Note  I connected withNAME@ on 11/10/21 at  8:45 AM EST by a video enabled telemedicine application and verified that I am speaking with the correct person using two identifiers.  Location: Patient: Home  Provider: [facility]   I discussed the limitations of evaluation and management by telemedicine and the availability of in person appointments. The patient expressed understanding and agreed to proceed.  [facility]  Patient Active Problem List   Diagnosis Date Noted   Sacral wound, subsequent encounter 10/29/2021   PICC (peripherally inserted central catheter) in place 10/29/2021   Medication monitoring encounter 09/30/2021   GIB (gastrointestinal bleeding) 09/21/2021   Duodenal ulcer 09/21/2021   Esophagitis 09/21/2021   Decubitus ulcer of sacral region, unstageable ([place]) 09/21/2021   Osteomyelitis of sacrum ([place]) 09/21/2021   Hyponatremia    AKI (acute kidney injury) ([place])    Acute blood loss anemia    Labile blood glucose    Thoracic spondylosis with myelopathy 09/02/2021   Debility 08/30/2021   Osteomyelitis of thoracic region ([place]) 08/06/2021   Essential hypertension 08/06/2021   CKD (chronic kidney disease) stage 3, GFR 30-59 ml/min (HCC) 08/06/2021   Pleural effusion, bilateral 08/06/2021   Diabetic ulcer of left great toe ([place])    MSSA bacteremia 07/23/2021   Constipation 07/23/2021   Lytic bone lesions on xray 07/22/2021   Anemia 05/19/2021   Nausea and vomiting 05/19/2021   Elevated blood pressure reading 05/19/2021   Bilateral carpal tunnel syndrome 01/23/2020   Numbness 12/12/2019   Microalbuminuria due to type 2 diabetes mellitus ([place]) 07/20/2019   GAD (generalized anxiety disorder) 06/18/2019   Adult ADHD 06/18/2019   Trigger middle finger of left hand 06/11/2019   Diabetic retinopathy ([place]) 07/13/2018   Type 2 diabetes mellitus with diabetic polyneuropathy, without long-term current use of insulin ([place])  05/17/2018   Tobacco use disorder 05/17/2018   Hyperlipidemia 05/17/2018    Patient's Medications  New Prescriptions   No medications on file  Previous Medications   ACETAMINOPHEN (TYLENOL) 325 MG TABLET    Take 1-2 tablets (325-650 mg total) by mouth every 4 (four) hours as needed for mild pain.   ALBUTEROL (VENTOLIN HFA) 108 (90 BASE) MCG/ACT INHALER    Inhale 1 puff into the lungs every 4 (four) hours as needed for wheezing or shortness of breath.   ALPRAZOLAM (XANAX) 0.25 MG TABLET    Take 1 tablet (0.25 mg total) by mouth 2 (two) times daily as needed for anxiety.   AMLODIPINE (NORVASC) 5 MG TABLET    Take 5 mg by mouth daily.   ATORVASTATIN (LIPITOR) 40 MG TABLET    Take 1 tablet by mouth daily.   BETHANECHOL (URECHOLINE) 10 MG TABLET    Take 1 tablet (10 mg total) by mouth 3 (three) times daily.   BISACODYL (DULCOLAX) 10 MG SUPPOSITORY    Place 1 suppository (10 mg total) rectally daily as needed for moderate constipation.   CHOLECALCIFEROL (VITAMIN D3) 25 MCG (1000 UNIT) TABLET    1 tablet   COLLAGENASE (SANTYL) OINTMENT    Apply topically daily.   DULAGLUTIDE (TRULICITY) 1.77 LT/9.0ZE SOPN    Inject 0.75 mg into the skin once a week.   GABAPENTIN (NEURONTIN) 100 MG CAPSULE    Take 2 capsules (200 mg total) by mouth 2 (two) times daily.   HYDROXYZINE (ATARAX/VISTARIL) 10 MG TABLET    Take 1 tablet (10 mg total) by mouth 3 (three) times daily as needed for anxiety.  INSULIN PEN NEEDLE (PENTIPS) 32G X 4 MM MISC    use as directed   LIDOCAINE (XYLOCAINE) 2 % JELLY    Place 1 application into the urethra every 6 (six) hours.   METHOCARBAMOL (ROBAXIN) 500 MG TABLET    Take 1 tablet (500 mg total) by mouth every 6 (six) hours as needed for muscle spasms.   METOPROLOL TARTRATE (LOPRESSOR) 50 MG TABLET    Take 1 tablet (50 mg total) by mouth 2 (two) times daily.   MIRTAZAPINE (REMERON) 15 MG TABLET    Take 1 tablet (15 mg total) by mouth at bedtime.   MORPHINE (MS CONTIN) 15 MG 12 HR  TABLET    Take 1 tablet (15 mg total) by mouth every 12 (twelve) hours. Do Not Fill Before 10/25/2021   MULTIPLE VITAMIN (MULTIVITAMIN ADULT PO)    Take 1 tablet by mouth daily.   MUPIROCIN OINTMENT (BACTROBAN) 2 %    Apply 1 application topically 2 (two) times daily.   NALOXONE (NARCAN) NASAL SPRAY 4 MG/0.1 ML    Use as needed for signs of overdose.   NUTRITION SUPPLEMENT, JUVEN, (JUVEN) PACK    Take 1 packet by mouth 2 (two) times daily between meals.   OXYCODONE-ACETAMINOPHEN (PERCOCET) 10-325 MG TABLET    Take 1 tablet by mouth every 6 (six) hours as needed for pain. Can fill 09/28/21   PANTOPRAZOLE (PROTONIX) 40 MG TABLET    Take 1 tablet (40 mg total) by mouth 2 (two) times daily.   PAROXETINE (PAXIL) 20 MG TABLET    Take 1 tablet (20 mg total) by mouth at bedtime.   POLYETHYLENE GLYCOL (MIRALAX / GLYCOLAX) 17 G PACKET    Take 17 g by mouth daily as needed for moderate constipation, mild constipation or severe constipation.   SENNA-DOCUSATE (SENOKOT-S) 8.6-50 MG TABLET    Take 2 tablets by mouth at bedtime as needed for mild constipation.   SUCRALFATE (CARAFATE) 1 GM/10ML SUSPENSION    Take 10 mLs (1 g total) by mouth 4 (four) times daily -  with meals and at bedtime.   TAMSULOSIN (FLOMAX) 0.4 MG CAPS CAPSULE    Take 1 capsule (0.4 mg total) by mouth daily after supper.   TRULICITY 1.5 VQ/0.0QQ SOPN    Inject 1.5 mg into the skin once a week.  Modified Medications   No medications on file  Discontinued Medications   No medications on file    History of Present Illness: Here for follow-up in the setting of thoracic discitis and osteomyelitis and decubitus sacral ulcer.  Patient was recently seen by Dr. Kathyrn Sheriff on 1/9 regarding his MRI findings on 1/3.  Per discussion with the patient, it seems there is a plan for surgical intervention from neurosurgery.  Patient is overall doing well and feels better.  He is able to walk around in his house with the help of cane.  Back pain is 2 out of 10  which is the lowest it has been.  He was able to take a shower yesterday by himself.  Denies nausea, vomiting, diarrhea.  Denies fever, chills and swelling.  Appetite is good and he has gained approximately 3 pounds recently.  No issues with PICC line.  Last seen by wound care on 1/4 and sacral wound was thought to be healing well.  Discussed with patient and wife about switching from IV ceftriaxone to p.o. cephalexin given he has already received a prolonged course of IV antibiotics and clinical improvement.  His ESR and  CRP are still elevated however these are nonspecific.   ROS: Negative for fever chills Negative for nausea vomiting All other systems reviewed and negative except as above  Past Medical History:  Diagnosis Date   AKI (acute kidney injury) ([place])    GAD (generalized anxiety disorder)    Hyperlipidemia    Macular degeneration, bilateral    Retinopathy    Type II diabetes mellitus with complication, uncontrolled    retinopathy, neuropathy, microalbuminuria   Past Surgical History:  Procedure Laterality Date   APPENDECTOMY     BIOPSY  08/10/2021   Procedure: BIOPSY;  Surgeon: Otis Brace, MD;  Location: [facility];  Service: Gastroenterology;;   BUBBLE STUDY  07/29/2021   Procedure: BUBBLE STUDY;  Surgeon: Jerline Pain, MD;  Location: [facility];  Service: Cardiovascular;;   ESOPHAGOGASTRODUODENOSCOPY (EGD) WITH PROPOFOL N/A 08/10/2021   Procedure: ESOPHAGOGASTRODUODENOSCOPY (EGD) WITH PROPOFOL;  Surgeon: Otis Brace, MD;  Location: [facility];  Service: Gastroenterology;  Laterality: N/A;   ESOPHAGOGASTRODUODENOSCOPY (EGD) WITH PROPOFOL N/A 08/24/2021   Procedure: ESOPHAGOGASTRODUODENOSCOPY (EGD) WITH PROPOFOL;  Surgeon: Otis Brace, MD;  Location: [facility];  Service: Gastroenterology;  Laterality: N/A;   HERNIA REPAIR     IR FLUORO GUIDED NEEDLE PLC ASPIRATION/INJECTION LOC  07/28/2021   LUMBAR LAMINECTOMY/DECOMPRESSION MICRODISCECTOMY N/A  08/07/2021   Procedure: THORACIC FOUR - THORACIC FIVE LAMINECTOMY/DECOMPRESSION OF SPINAL CORD, DEBRIDEMENT OF ABSCESS, MICRODISCECTOMY, INTRAOPERATIVE ULTRASOUND;  Surgeon: Consuella Lose, MD;  Location: [facility];  Service: Neurosurgery;  Laterality: N/A;   TEE WITHOUT CARDIOVERSION N/A 07/29/2021   Procedure: TRANSESOPHAGEAL ECHOCARDIOGRAM (TEE);  Surgeon: Jerline Pain, MD;  Location[facility];  Service: Cardiovascular;  Laterality: N/A;   TRIGGER FINGER RELEASE Right 10/25/2019   Procedure: RIGHT INDEX FINGER RELEASE TRIGGER FINGER/A-1 PULLEY;  Surgeon: Daryll Brod, MD;  Location: [facility];  Service: Orthopedics;  Laterality: Right;  IV REGIONAL FOREARM BLOCK     Social History   Tobacco Use   Smoking status: Former    Packs/day: 1.00    Years: 20.00    Pack years: 20.00    Types: Cigarettes    Quit date: 07/2020    Years since quitting: 1.2   Smokeless tobacco: Never  Vaping Use   Vaping Use: Never used  Substance Use Topics   Alcohol use: Not Currently   Drug use: Never    Family History  Problem Relation Age of Onset   Diabetes Mother    Hyperlipidemia Mother    Stroke Mother    Diabetes Father    Hyperlipidemia Brother    Stroke Brother    ADD / ADHD Brother    ADD / ADHD Son     Allergies  Allergen Reactions   Cranberry Itching   Hm Lidocaine Patch [Lidocaine] Dermatitis    Blisters skin    Melatonin Other (See Comments)    nightmares    Health Maintenance  Topic Date Due   COLONOSCOPY (Pts 45-55yr Insurance coverage will need to be confirmed)  Never done   Pneumococcal Vaccine [id number] old (2 - PCV) 07/12/2019   URINE MICROALBUMIN  04/11/2020   COVID-19 Vaccine (3 - Booster for Pfizer series) 04/17/2020   TETANUS/TDAP  10/25/2020   OPHTHALMOLOGY EXAM  11/20/2020   INFLUENZA VACCINE  05/25/2021   HEMOGLOBIN A1C  12/12/2021   FOOT EXAM  06/11/2022   Hepatitis C Screening  Completed   HIV Screening  Completed   HPV  VACCINES  Aged Out    Observations/Objective: Problem List Items  Addressed This Visit       Musculoskeletal and Integument   Osteomyelitis of thoracic region [facility]) - Primary   Relevant Medications   cephALEXin (KEFLEX) 500 MG capsule   Pathologic fracture of thoracic vertebrae, sequela     Other   Medication monitoring encounter   PICC (peripherally inserted central catheter) in place   Hyperkalemia   Relevant Orders   Basic Metabolic Panel (BMET) (Completed)    Assessment and Plan: Thoracic discitis and osteomyelitis Pathologic T4-T5 Fracture  reviewed office notes from [facility] - no mention of possible surgical plans. However, patient says he will need another surgery once he completes the IV abtx.  Decubitus sacral ulcer  -wound healing well per wound care notes  Medication monitoring Home health labs reviewed.  Potassium 6.1.  Creatinine elevated to 2.14.  Patient tells me he has a history of CKD stage III that was diagnosed last year.   PICC-no issues per patient  Stat BMP given hyperkalemia. Stop taking Multivitamins for now Will switch IV Cefazolin to PO cephalexin 568m PO TID, scripts sent. PICC line to be removed  Fu with Neurosurgery as instructed  Fu with wound care for sacral ulcer He may need referral to Nephrology given h/o CKD I will follow him in 6 weeks   Follow Up Instructions: 6 weeks    I discussed the assessment and treatment plan with the patient. The patient was provided an opportunity to ask questions and all were answered. The patient agreed with the plan and demonstrated an understanding of the instructions.   The patient was advised to call back or seek an in-person evaluation if the symptoms worsen or if the condition fails to improve as anticipated.  I provided 40 minutes of non-face-to-face time during this encounter.  SWilber Oliphant MA[facility]ious [facility] [phone]   [phone] 11/10/2021, 8:27 AM

## 2021-11-10 NOTE — Telephone Encounter (Signed)
-----   Message from Rosiland Oz, MD sent at 11/10/2021  1:50 PM EST ----- Regarding: Fu on Hyperkalemia Please let him know that his K is 5.8 which is down from 6.1. However, his creatinine continues to rise. I would like him to see a Nephrologist as well to keep an eye on his renal function. Let me know if he needs a referral.   I would like to get another BMP on Friday to make sure K levels are within normal range. I advise him to stop taking any multi vitamins or potassium supplements he might be taking in the meantime.   I will order a standing order for BMP for this Friday.

## 2021-11-10 NOTE — Telephone Encounter (Signed)
Thank you :)

## 2021-11-10 NOTE — Telephone Encounter (Signed)
Patient aware of results potassium level. Patient has stage 3 kidney disease and is a patient at [facility]. Patient is to call set up appointment/notify kidney doctor of elevated creatinine levels. Patient scheduled for repeat BMP on Friday 11/13/2021 at [place] Tracie Dore, CMA

## 2021-11-10 NOTE — Telephone Encounter (Signed)
Verbal orders given to [facility] at [facility] to pull PICC line today 11/10/21 and stop IV antibiotics Cefazolin.   Adelfa Koh, CMA

## 2021-11-11 ENCOUNTER — Encounter (HOSPITAL_BASED_OUTPATIENT_CLINIC_OR_DEPARTMENT_OTHER): Payer: 59 | Admitting: Physician Assistant

## 2021-11-12 ENCOUNTER — Other Ambulatory Visit: Payer: Self-pay | Admitting: Neurosurgery

## 2021-11-12 DIAGNOSIS — S22009S Unspecified fracture of unspecified thoracic vertebra, sequela: Secondary | ICD-10-CM

## 2021-11-13 ENCOUNTER — Other Ambulatory Visit: Payer: 59

## 2021-11-13 ENCOUNTER — Other Ambulatory Visit: Payer: Self-pay

## 2021-11-13 DIAGNOSIS — E875 Hyperkalemia: Secondary | ICD-10-CM

## 2021-11-14 ENCOUNTER — Encounter: Payer: Self-pay | Admitting: Infectious Diseases

## 2021-11-14 LAB — BASIC METABOLIC PANEL
BUN/Creatinine Ratio: 20 (calc) (ref 6–22)
BUN: 43 mg/dL — ABNORMAL HIGH (ref 7–25)
CO2: 28 mmol/L (ref 20–32)
Calcium: 8.9 mg/dL (ref 8.6–10.3)
Chloride: 104 mmol/L (ref 98–110)
Creat: 2.13 mg/dL — ABNORMAL HIGH (ref 0.60–1.29)
Glucose, Bld: 291 mg/dL — ABNORMAL HIGH (ref 65–99)
Potassium: 5.5 mmol/L — ABNORMAL HIGH (ref 3.5–5.3)
Sodium: 137 mmol/L (ref 135–146)

## 2021-11-16 ENCOUNTER — Inpatient Hospital Stay: Payer: 59 | Admitting: Physical Medicine and Rehabilitation

## 2021-11-16 ENCOUNTER — Telehealth: Payer: Self-pay

## 2021-11-16 NOTE — Telephone Encounter (Signed)
Left voicemail asking patient to return my call. Also advised I would send him a message via my chart.   [place], CMA

## 2021-11-16 NOTE — Telephone Encounter (Signed)
-----   Message from Rosiland Oz, MD sent at 11/16/2021  8:11 AM EST ----- Potassium and CR are trending down Advise to follow up with Nephrology given history of CKD

## 2021-11-18 ENCOUNTER — Other Ambulatory Visit: Payer: Self-pay

## 2021-11-18 ENCOUNTER — Encounter (HOSPITAL_BASED_OUTPATIENT_CLINIC_OR_DEPARTMENT_OTHER): Payer: 59 | Admitting: Physician Assistant

## 2021-11-18 DIAGNOSIS — E11622 Type 2 diabetes mellitus with other skin ulcer: Secondary | ICD-10-CM | POA: Diagnosis not present

## 2021-11-18 NOTE — Progress Notes (Addendum)
NGUYEN, TODOROV ([id number]) Visit Report for 11/18/2021 Chief Complaint Document Details Patient Name: Date of Service: Jose [name], SCHWANDT 11/18/2021 9:45 A M Medical Record Number: [id number] Patient Account Number: [id number] Date of Birth/Sex: Treating RN: 03-Nov-1975 (46 y.o. [name] Primary Care Provider: [name] Other Clinician: Referring Provider: Treating Provider/Extender: Cristina Gong in Treatment: 8 Information Obtained from: Patient Chief Complaint Sacral abscess with osteomyelitis Electronic Signature(s) Signed: 11/18/2021 10:25:37 AM By: Worthy Keeler PA-C Entered By: Worthy Keeler on 11/18/2021 10:25:36 -------------------------------------------------------------------------------- Debridement Details Patient Name: Date of Service: Jose V IS, Shamal Stracener [name]. 11/18/2021 9:45 A M Medical Record Number: [id number] Patient Account Number: [id number] Date of Birth/Sex: Treating RN: June 03, 1976 (46 y.o. [name] Primary Care Provider: [name] Other Clinician: Referring Provider: Treating Provider/Extender: Cristina Gong in Treatment: 8 Debridement Performed for Assessment: Wound #1 Sacrum Performed By: Physician Worthy Keeler, PA Debridement Type: Debridement Level of Consciousness (Pre-procedure): Awake and Alert Pre-procedure Verification/Time Out Yes - 10:34 Taken: Start Time: 10:35 T Area Debrided (L x W): otal 1.7 (cm) x 0.4 (cm) = 0.68 (cm) Tissue and other material debrided: Viable, Non-Viable, Subcutaneous, Skin: Dermis , Hyper-granulation Level: Skin/Subcutaneous Tissue Debridement Description: Excisional Instrument: Curette, Other : Silver Nitrate Bleeding: Minimum Hemostasis Achieved: Pressure End Time: 10:41 Response to Treatment: Procedure was tolerated well Level of Consciousness (Post- Awake and Alert procedure): Post Debridement Measurements of Total  Wound Length: (cm) 1.7 Stage: Category/Stage III Width: (cm) 0.4 Depth: (cm) 0.1 Volume: (cm) 0.053 Character of Wound/Ulcer Post Debridement: Stable Post Procedure Diagnosis Same as Pre-procedure Electronic Signature(s) Signed: 11/18/2021 10:52:08 AM By: Worthy Keeler PA-C Signed: 11/18/2021 4:46:14 PM By: Baruch Gouty RN, BSN Entered By: Worthy Keeler on 11/18/2021 10:52:08 -------------------------------------------------------------------------------- HPI Details Patient Name: Date of Service: Jose V IS, Jose Evert K. 11/18/2021 9:45 A M Medical Record Number: [id number] Patient Account Number: [id number] Date of Birth/Sex: Treating RN: Apr 06, 1976 (46 y.o. [name] Primary Care Provider: [name] Other Clinician: Referring Provider: Treating Provider/Extender: Cristina Gong in Treatment: 8 History of Present Illness HPI Description: 09/23/2021 upon evaluation today patient presents for initial inspection here in the clinic concerning a wound that he has over the sacral region. He unfortunately has been experiencing issues with a pilonidal cyst that opened in September 2022. It was also noted in the end that he also had osteomyelitis in the sacral region. That is quite unfortunate he still on IV antibiotics for that his wife helps to manage this at home. This is a stage III pressure ulcer. Subsequently the patient also has diabetes mellitus type 2, generalized weakness, and again the history of pilonidal cyst in this area with abscess. He does have chronic kidney disease stage III. His most recent hemoglobin A1c was 7.1 at last check. He does not have home health his wife is the one performing all the dressing changes currently they have been doing Santyl with Dakin's moistened gauze behind it 2 times a day. 10/07/2021 upon evaluation today patient appears to be doing well with regard to his wound in general. He has been tolerating the  dressing changes without complication. In fact the Dakin's moistened gauze has been doing excellent I think based on what I am seeing and overall I am extremely pleased with where we stand I think he is in a good spot to begin with the wound VAC to be honest. 10/14/2021 upon evaluation today patient  appears to be doing better in regard to his wound I am actually seeing signs of good improvement which is great news. Fortunately I do not see any evidence of active infection locally nor systemically at this time which is also great news. No fevers, chills, nausea, vomiting, or diarrhea. 10/28/2021 upon evaluation today patient appears to be doing well with regard to his wound. This is actually filled in significantly since I last saw him. Fortunately I do not see any signs of active infection locally nor systemically at this time. No fevers, chills, nausea, vomiting, or diarrhea. 11/18/2021 upon evaluation today patient appears to be doing well with regard to his wound. The biggest issue I see is there is a hypergranular area almost mushroomed up that I think is going to need to be trimmed down in order to allow this to actually heal. That is something that I Georgina Peer go ahead and do today. Other than that I think he is very close to complete resolution. Electronic Signature(s) Signed: 11/18/2021 10:50:35 AM By: Worthy Keeler PA-C Entered By: Worthy Keeler on 11/18/2021 10:50:35 -------------------------------------------------------------------------------- Physical Exam Details Patient Name: Date of Service: Jose V IS, Jose K. 11/18/2021 9:45 A M Medical Record Number: [id number] Patient Account Number: [id number] Date of Birth/Sex: Treating RN: Aug 28, 1976 (46 y.o. [name] Primary Care Provider: [name] Other Clinician: Referring Provider: Treating Provider/Extender: Cristina Gong in Treatment: 8 Constitutional Well-nourished and well-hydrated in no  acute distress. Respiratory normal breathing without difficulty. Psychiatric this patient is able to make decisions and demonstrates good insight into disease process. Alert and Oriented x 3. pleasant and cooperative. Notes Upon inspection patient's wound did require debridement to clear away some of the hypergranular tissue that was bubbled out and mushroomed external from where the wound was trying to heal. He tolerated that with minimal discomfort postdebridement I did use silver nitrate to chemically cauterize the area to prevent it from hopefully overgrowing again and then we will get a continue with the Hydrofera Blue. Electronic Signature(s) Signed: 11/18/2021 10:51:01 AM By: Worthy Keeler PA-C Entered By: Worthy Keeler on 11/18/2021 10:51:00 -------------------------------------------------------------------------------- Physician Orders Details Patient Name: Date of Service: Jose V IS, Jose K. 11/18/2021 9:45 A M Medical Record Number: [id number] Patient Account Number: [id number] Date of Birth/Sex: Treating RN: June 26, 1976 (46 y.o. Marcheta Grammes Primary Care Provider: [name] Other Clinician: Referring Provider: Treating Provider/Extender: Cristina Gong in Treatment: 8 Verbal / Phone Orders: No Diagnosis Coding ICD-10 Coding Code Description L89.153 Pressure ulcer of sacral region, stage 3 M46.28 Osteomyelitis of vertebra, sacral and sacrococcygeal region E11.622 Type 2 diabetes mellitus with other skin ulcer M62.81 Muscle weakness (generalized) L05.01 Pilonidal cyst with abscess N18.30 Chronic kidney disease, stage 3 unspecified Follow-up Appointments ppointment in 1 week. - with Margarita Grizzle Return A Negative Presssure Wound Therapy Discontinue wound vac - Patient to call [employer] to pick up the wound vac system. Off-Loading Turn and reposition every 2 hours Other: - continue to use cushion when sitting. Wound Treatment Wound #1 -  Sacrum Cleanser: Wound Cleanser 3 x Per Week/30 Days Discharge Instructions: Cleanse the wound with wound cleanser prior to applying a clean dressing using gauze sponges, not tissue or cotton balls. Peri-Wound Care: Skin Prep 3 x Per Week/30 Days Discharge Instructions: Use skin prep as directed Prim Dressing: Hydrofera Blue Ready Foam, 2.5 x2.5 in 3 x Per Week/30 Days ary Discharge Instructions: Apply to wound bed as instructed Secondary Dressing:  Woven Gauze Sponges 2x2 in 3 x Per Week/30 Days Discharge Instructions: Apply over hydrofera blue. Secondary Dressing: Zetuvit Plus Silicone Border Dressing 4x4 (in/in) 3 x Per Week/30 Days Discharge Instructions: Apply silicone border over primary dressing as directed. Electronic Signature(s) Signed: 11/18/2021 3:45:35 PM By: Worthy Keeler PA-C Signed: 11/18/2021 5:06:58 PM By: Lorrin Jackson Entered By: Lorrin Jackson on 11/18/2021 10:43:29 -------------------------------------------------------------------------------- Problem List Details Patient Name: Date of Service: Jose V IS, Jose [name]. 11/18/2021 9:45 A M Medical Record Number: [id number] Patient Account Number: [id number] Date of Birth/Sex: Treating RN: May 10, 1976 (46 y.o. [name] Primary Care Provider: [name] Other Clinician: Referring Provider: Treating Provider/Extender: Cristina Gong in Treatment: 8 Active Problems ICD-10 Encounter Code Description Active Date MDM Diagnosis L89.153 Pressure ulcer of sacral region, stage 3 09/23/2021 No Yes M46.28 Osteomyelitis of vertebra, sacral and sacrococcygeal region 09/23/2021 No Yes E11.622 Type 2 diabetes mellitus with other skin ulcer 09/23/2021 No Yes M62.81 Muscle weakness (generalized) 09/23/2021 No Yes L05.01 Pilonidal cyst with abscess 09/23/2021 No Yes N18.30 Chronic kidney disease, stage 3 unspecified 09/23/2021 No Yes Inactive Problems Resolved Problems Electronic  Signature(s) Signed: 11/18/2021 10:24:50 AM By: Worthy Keeler PA-C Entered By: Worthy Keeler on 11/18/2021 10:24:50 -------------------------------------------------------------------------------- Progress Note Details Patient Name: Date of Service: Jose V IS, Jose [name]. 11/18/2021 9:45 A M Medical Record Number: [id number] Patient Account Number: [id number] Date of Birth/Sex: Treating RN: February 10, 1976 (46 y.o. [name] Primary Care Provider: [name] Other Clinician: Referring Provider: Treating Provider/Extender: Cristina Gong in Treatment: 8 Subjective Chief Complaint Information obtained from Patient Sacral abscess with osteomyelitis History of Present Illness (HPI) 09/23/2021 upon evaluation today patient presents for initial inspection here in the clinic concerning a wound that he has over the sacral region. He unfortunately has been experiencing issues with a pilonidal cyst that opened in September 2022. It was also noted in the end that he also had osteomyelitis in the sacral region. That is quite unfortunate he still on IV antibiotics for that his wife helps to manage this at home. This is a stage III pressure ulcer. Subsequently the patient also has diabetes mellitus type 2, generalized weakness, and again the history of pilonidal cyst in this area with abscess. He does have chronic kidney disease stage III. His most recent hemoglobin A1c was 7.1 at last check. He does not have home health his wife is the one performing all the dressing changes currently they have been doing Santyl with Dakin's moistened gauze behind it 2 times a day. 10/07/2021 upon evaluation today patient appears to be doing well with regard to his wound in general. He has been tolerating the dressing changes without complication. In fact the Dakin's moistened gauze has been doing excellent I think based on what I am seeing and overall I am extremely pleased with  where we stand I think he is in a good spot to begin with the wound VAC to be honest. 10/14/2021 upon evaluation today patient appears to be doing better in regard to his wound I am actually seeing signs of good improvement which is great news. Fortunately I do not see any evidence of active infection locally nor systemically at this time which is also great news. No fevers, chills, nausea, vomiting, or diarrhea. 10/28/2021 upon evaluation today patient appears to be doing well with regard to his wound. This is actually filled in significantly since I last saw him. Fortunately I do not see any  signs of active infection locally nor systemically at this time. No fevers, chills, nausea, vomiting, or diarrhea. 11/18/2021 upon evaluation today patient appears to be doing well with regard to his wound. The biggest issue I see is there is a hypergranular area almost mushroomed up that I think is going to need to be trimmed down in order to allow this to actually heal. That is something that I Georgina Peer go ahead and do today. Other than that I think he is very close to complete resolution. Objective Constitutional Well-nourished and well-hydrated in no acute distress. Vitals Time Taken: 9:53 AM, Height: 71 in, Weight: 180 lbs, BMI: 25.1, Temperature: 97.9 F, Pulse: 81 bpm, Respiratory Rate: 18 breaths/min, Blood Pressure: 164/83 mmHg. Respiratory normal breathing without difficulty. Psychiatric this patient is able to make decisions and demonstrates good insight into disease process. Alert and Oriented x 3. pleasant and cooperative. General Notes: Upon inspection patient's wound did require debridement to clear away some of the hypergranular tissue that was bubbled out and mushroomed external from where the wound was trying to heal. He tolerated that with minimal discomfort postdebridement I did use silver nitrate to chemically cauterize the area to prevent it from hopefully overgrowing again and then we  will get a continue with the Hydrofera Blue. Integumentary (Hair, Skin) Wound #1 status is Open. Original cause of wound was Pressure Injury. The date acquired was: 07/22/2021. The wound has been in treatment 8 weeks. The wound is located on the Sacrum. The wound measures 1.7cm length x 0.4cm width x 0.1cm depth; 0.534cm^2 area and 0.053cm^3 volume. There is Fat Layer (Subcutaneous Tissue) exposed. There is no tunneling or undermining noted. There is a medium amount of serosanguineous drainage noted. The wound margin is well defined and not attached to the wound base. There is large (67-100%) red, pink, hyper - granulation within the wound bed. There is no necrotic tissue within the wound bed. Assessment Active Problems ICD-10 Pressure ulcer of sacral region, stage 3 Osteomyelitis of vertebra, sacral and sacrococcygeal region Type 2 diabetes mellitus with other skin ulcer Muscle weakness (generalized) Pilonidal cyst with abscess Chronic kidney disease, stage 3 unspecified Procedures Wound #1 Pre-procedure diagnosis of Wound #1 is a Pressure Ulcer located on the Sacrum . There was a Excisional Skin/Subcutaneous Tissue Debridement with a total area of 0.68 sq cm performed by Worthy Keeler, PA. With the following instrument(s): Curette, Silver Nitrate to remove Viable and Non-Viable tissue/material. Material removed includes Subcutaneous Tissue, Skin: Dermis, and Hyper-granulation. No specimens were taken. A time out was conducted at 10:34, prior to the start of the procedure. A Minimum amount of bleeding was controlled with Pressure. The procedure was tolerated well. Post Debridement Measurements: 1.7cm length x 0.4cm width x 0.1cm depth; 0.053cm^3 volume. Post debridement Stage noted as Category/Stage III. Character of Wound/Ulcer Post Debridement is stable. Post procedure Diagnosis Wound #1: Same as Pre-Procedure Plan Follow-up Appointments: Return Appointment in 1 week. - with  Margarita Grizzle Negative Presssure Wound Therapy: Discontinue wound vac - Patient to call [employer] to pick up the wound vac system. Off-Loading: Turn and reposition every 2 hours Other: - continue to use cushion when sitting. WOUND #1: - Sacrum Wound Laterality: Cleanser: Wound Cleanser 3 x Per Week/30 Days Discharge Instructions: Cleanse the wound with wound cleanser prior to applying a clean dressing using gauze sponges, not tissue or cotton balls. Peri-Wound Care: Skin Prep 3 x Per Week/30 Days Discharge Instructions: Use skin prep as directed Prim Dressing: Hydrofera Blue Ready Foam, 2.5  x2.5 in 3 x Per Week/30 Days ary Discharge Instructions: Apply to wound bed as instructed Secondary Dressing: Woven Gauze Sponges 2x2 in 3 x Per Week/30 Days Discharge Instructions: Apply over hydrofera blue. Secondary Dressing: Zetuvit Plus Silicone Border Dressing 4x4 (in/in) 3 x Per Week/30 Days Discharge Instructions: Apply silicone border over primary dressing as directed. 1. Would recommend currently that we going continue with the wound care measures as before and the patient is in agreement with the plan. This includes the use of the [facility] dressing which I think is still doing a good job. 2. Also can recommend that we continue with appropriate offloading I think he still doing well in that regard. We will see patient back for reevaluation in 1 week here in the clinic. If anything worsens or changes patient will contact our office for additional recommendations. Electronic Signature(s) Signed: 11/18/2021 10:52:26 AM By: Worthy Keeler PA-C Previous Signature: 11/18/2021 10:51:38 AM Version By: Worthy Keeler PA-C Entered By: Worthy Keeler on 11/18/2021 10:52:26 -------------------------------------------------------------------------------- SuperBill Details Patient Name: Date of Service: Jose V IS, Jose [name] 11/18/2021 Medical Record Number: [id number] Patient Account Number: [id number] Date of  Birth/Sex: Treating RN: 02-12-1976 (46 y.o. Marcheta Grammes Primary Care Provider: [name] Other Clinician: Referring Provider: Treating Provider/Extender: Cristina Gong in Treatment: 8 Diagnosis Coding ICD-10 Codes Code Description [phone] Pressure ulcer of sacral region, stage 3 M46.28 Osteomyelitis of vertebra, sacral and sacrococcygeal region E11.622 Type 2 diabetes mellitus with other skin ulcer M62.81 Muscle weakness (generalized) L05.01 Pilonidal cyst with abscess N18.30 Chronic kidney disease, stage 3 unspecified Facility Procedures CPT4 Code: [id number] I Description: 52481 - DEB SUBQ TISSUE 20 SQ CM/< ICD-10 Diagnosis Description CD-10 Diagnosis Description L89.153 Pressure ulcer of sacral region, stage 3 Modifier: Quantity: 1 Physician Procedures : CPT4 Code Description Modifier [id number] - WC PHYS SUBQ TISS 20 SQ CM ICD-10 Diagnosis Description L89.153 Pressure ulcer of sacral region, stage 3 Quantity: 1 Electronic Signature(s) Signed: 11/18/2021 10:52:44 AM By: Worthy Keeler PA-C Previous Signature: 11/18/2021 10:51:17 AM Version By: Lorrin Jackson Entered By: Worthy Keeler on 11/18/2021 10:52:43

## 2021-11-19 ENCOUNTER — Encounter: Payer: Self-pay | Admitting: Endocrinology

## 2021-11-19 ENCOUNTER — Ambulatory Visit: Payer: 59 | Admitting: Infectious Diseases

## 2021-11-19 NOTE — Progress Notes (Signed)
HALDON, CARLEY ([id number]) Visit Report for 11/18/2021 Arrival Information Details Patient Name: Date of Service: REA EDGERRIN, CORREIA 11/18/2021 9:45 A M Medical Record Number: [id number] Patient Account Number: [id number] Date of Birth/Sex: Treating RN: 1975-12-25 (46 y.o. [name] Primary Care Darrel Gloss: [name] Other Clinician: Referring Daurice Ovando: Treating Jaizon Deroos/Extender: Cristina Gong in Treatment: 8 Visit Information History Since Last Visit Added or deleted any medications: No Patient Arrived: Kasandra Knudsen Any new allergies or adverse reactions: No Arrival Time: 09:52 Had a fall or experienced change in No Accompanied By: wife activities of daily living that may affect Transfer Assistance: Manual risk of falls: Patient Identification Verified: Yes Signs or symptoms of abuse/neglect since last visito No Secondary Verification Process Completed: Yes Hospitalized since last visit: No Patient Requires Transmission-Based Precautions: No Implantable device outside of the clinic excluding No Patient Has Alerts: No cellular tissue based products placed in the center since last visit: Has Dressing in Place as Prescribed: Yes Pain Present Now: Yes Electronic Signature(s) Signed: 11/19/2021 3:23:49 PM By: Sandre Kitty Entered By: Sandre Kitty on 11/18/2021 09:53:39 -------------------------------------------------------------------------------- Encounter Discharge Information Details Patient Name: Date of Service: REA Cristal Deer. 11/18/2021 9:45 A M Medical Record Number: [id number] Patient Account Number: [id number] Date of Birth/Sex: Treating RN: November 12, 1975 (46 y.o. Marcheta Grammes Primary Care Odie Edmonds: [name] Other Clinician: Referring Jerrye Seebeck: Treating Mekel Haverstock/Extender: Cristina Gong in Treatment: 8 Encounter Discharge Information Items Post Procedure Vitals Discharge Condition:  Stable Temperature (F): 97.9 Ambulatory Status: Ambulatory Pulse (bpm): 81 Discharge Destination: Home Respiratory Rate (breaths/min): 18 Transportation: Private Auto Blood Pressure (mmHg): 164/83 Accompanied By: wife Schedule Follow-up Appointment: Yes Clinical Summary of Care: Provided on 11/18/2021 Form Type Recipient Paper Patient Patient Electronic Signature(s) Signed: 11/18/2021 10:52:31 AM By: Lorrin Jackson Entered By: Lorrin Jackson on 11/18/2021 10:52:31 -------------------------------------------------------------------------------- Lower Extremity Assessment Details Patient Name: Date of Service: REA GERELL, FORTSON 11/18/2021 9:45 A M Medical Record Number: [id number] Patient Account Number: [id number] Date of Birth/Sex: Treating RN: 10/30/1975 (46 y.o. Marcheta Grammes Primary Care Aydian Dimmick: [name] Other Clinician: Referring Desare Duddy: Treating Seraphim Trow/Extender: Gillie Manners, Loni Muse in Treatment: 8 Electronic Signature(s) Signed: 11/18/2021 10:00:43 AM By: Fara Chute By: Lorrin Jackson on 11/18/2021 10:00:43 -------------------------------------------------------------------------------- Multi-Disciplinary Care Plan Details Patient Name: Date of Service: [name], Hoyt Koch. 11/18/2021 9:45 A M Medical Record Number: [id number] Patient Account Number: [id number] Date of Birth/Sex: Treating RN: 1976/08/06 (46 y.o. Marcheta Grammes Primary Care Gertrude Bucks: [name] Other Clinician: Referring Zinia Innocent: Treating Murl Zogg/Extender: Cristina Gong in Treatment: 8 Active Inactive Osteomyelitis Nursing Diagnoses: Infection: osteomyelitis Goals: Patient/caregiver will verbalize understanding of disease process and disease management Date Initiated: 09/23/2021 Target Resolution Date: 12/16/2021 Goal Status: Active Interventions: Assess for signs and symptoms of osteomyelitis resolution every  visit Provide education on osteomyelitis Notes: Wound/Skin Impairment Nursing Diagnoses: Impaired tissue integrity Goals: Patient/caregiver will verbalize understanding of skin care regimen Date Initiated: 09/23/2021 Target Resolution Date: 12/16/2021 Goal Status: Active Ulcer/skin breakdown will have a volume reduction of 30% by week 4 Date Initiated: 09/23/2021 Date Inactivated: 10/28/2021 Target Resolution Date: 10/21/2021 Goal Status: Met Interventions: Assess patient/caregiver ability to obtain necessary supplies Assess patient/caregiver ability to perform ulcer/skin care regimen upon admission and as needed Assess ulceration(s) every visit Provide education on ulcer and skin care Treatment Activities: Topical wound management initiated : 09/23/2021 Notes: Electronic Signature(s) Signed: 11/18/2021 5:06:58 PM By: Lorrin Jackson Entered By: Onnie Boer  Jodi on 11/18/2021 10:04:13 -------------------------------------------------------------------------------- Pain Assessment Details Patient Name: Date of Service: REA CHANG, TIGGS 11/18/2021 9:45 A M Medical Record Number: [id number] Patient Account Number: [id number] Date of Birth/Sex: Treating RN: 1976/04/07 (46 y.o. [name] Primary Care Daimian Sudberry: [name] Other Clinician: Referring Cregg Jutte: Treating Lisamarie Coke/Extender: Cristina Gong in Treatment: 8 Active Problems Location of Pain Severity and Description of Pain Patient Has Paino Yes Site Locations Rate the pain. Current Pain Level: 2 Pain Management and Medication Current Pain Management: Electronic Signature(s) Signed: 11/18/2021 4:46:14 PM By: Baruch Gouty RN, BSN Signed: 11/19/2021 3:23:49 PM By: Sandre Kitty Entered By: Sandre Kitty on 11/18/2021 09:54:38 -------------------------------------------------------------------------------- Patient/Caregiver Education Details Patient Name: Date of  Service: REA Cristal Deer 1/25/2023andnbsp9:45 A M Medical Record Number: [id number] Patient Account Number: [id number] Date of Birth/Gender: Treating RN: 04-11-1976 (46 y.o. Marcheta Grammes Primary Care Physician: [name] Other Clinician: Referring Physician: Treating Physician/Extender: Cristina Gong in Treatment: 8 Education Assessment Education Provided To: Patient Education Topics Provided Wound/Skin Impairment: Methods: Explain/Verbal, Printed Responses: State content correctly Electronic Signature(s) Signed: 11/18/2021 5:06:58 PM By: Lorrin Jackson Entered By: Lorrin Jackson on 11/18/2021 10:04:29 -------------------------------------------------------------------------------- Wound Assessment Details Patient Name: Date of Service: REA Cristal Deer. 11/18/2021 9:45 A M Medical Record Number: [id number] Patient Account Number: [id number] Date of Birth/Sex: Treating RN: 02/02/1976 (46 y.o. [name] Primary Care Javione Gunawan: [name] Other Clinician: Referring Marilynne Dupuis: Treating Murdis Flitton/Extender: Cristina Gong in Treatment: 8 Wound Status Wound Number: 1 Primary Etiology: Pressure Ulcer Wound Location: Sacrum Wound Status: Open Wounding Event: Pressure Injury Comorbid Hypertension, Type II Diabetes, Osteomyelitis, History: Neuropathy Date Acquired: 07/22/2021 Weeks Of Treatment: 8 Clustered Wound: No Photos Wound Measurements Length: (cm) 1.7 Width: (cm) 0.4 Depth: (cm) 0.1 Area: (cm) 0.534 Volume: (cm) 0.053 % Reduction in Area: 96.2% % Reduction in Volume: 99.9% Epithelialization: Large (67-100%) Tunneling: No Undermining: No Wound Description Classification: Category/Stage III Wound Margin: Well defined, not attached Exudate Amount: Medium Exudate Type: Serosanguineous Exudate Color: red, brown Foul Odor After Cleansing: No Slough/Fibrino No Wound Bed Granulation Amount:  Large (67-100%) Exposed Structure Granulation Quality: Red, Pink, Hyper-granulation Fascia Exposed: No Necrotic Amount: None Present (0%) Fat Layer (Subcutaneous Tissue) Exposed: Yes Tendon Exposed: No Muscle Exposed: No Joint Exposed: No Bone Exposed: No Treatment Notes Wound #1 (Sacrum) Cleanser Wound Cleanser Discharge Instruction: Cleanse the wound with wound cleanser prior to applying a clean dressing using gauze sponges, not tissue or cotton balls. Peri-Wound Care Skin Prep Discharge Instruction: Use skin prep as directed Topical Primary Dressing Hydrofera Blue Ready Foam, 2.5 x2.5 in Discharge Instruction: Apply to wound bed as instructed Secondary Dressing Woven Gauze Sponges 2x2 in Discharge Instruction: Apply over hydrofera blue. Zetuvit Plus Silicone Border Dressing 4x4 (in/in) Discharge Instruction: Apply silicone border over primary dressing as directed. Secured With Compression Wrap Compression Stockings Environmental education officer) Signed: 11/18/2021 4:46:14 PM By: Baruch Gouty RN, BSN Signed: 11/18/2021 5:06:58 PM By: Lorrin Jackson Entered By: Lorrin Jackson on 11/18/2021 10:03:42 -------------------------------------------------------------------------------- Vitals Details Patient Name: Date of Service: [name], Hoyt Koch. 11/18/2021 9:45 A M Medical Record Number: [id number] Patient Account Number: [id number] Date of Birth/Sex: Treating RN: [birth year] (46 y.o. [name] Primary Care Gracielynn Birkel: [name] Other Clinician: Referring Anaiz Qazi: Treating Dereka Lueras/Extender: Cristina Gong in Treatment: 8 Vital Signs Time Taken: 09:53 Temperature (F): 97.9 Height (in): 71 Pulse (bpm): 81 Weight (lbs): 180 Respiratory Rate (  breaths/min): 18 Body Mass Index (BMI): 25.1 Blood Pressure (mmHg): 164/83 Reference Range: 80 - 120 mg / dl Electronic Signature(s) Signed: 11/19/2021 3:23:49 PM By: Sandre Kitty Entered By: Sandre Kitty on 11/18/2021 09:54:15

## 2021-11-25 ENCOUNTER — Encounter (HOSPITAL_BASED_OUTPATIENT_CLINIC_OR_DEPARTMENT_OTHER): Payer: 59 | Admitting: Physician Assistant

## 2021-11-25 ENCOUNTER — Other Ambulatory Visit: Payer: 59

## 2021-12-02 ENCOUNTER — Encounter (HOSPITAL_BASED_OUTPATIENT_CLINIC_OR_DEPARTMENT_OTHER): Payer: 59 | Attending: Physician Assistant | Admitting: Physician Assistant

## 2021-12-02 ENCOUNTER — Telehealth: Payer: Self-pay | Admitting: *Deleted

## 2021-12-02 MED ORDER — MORPHINE SULFATE ER 15 MG PO TBCR: 15.0000 mg | EXTENDED_RELEASE_TABLET | Freq: Two times a day (BID) | ORAL | 0 refills | Status: DC

## 2021-12-02 NOTE — Telephone Encounter (Signed)
Prior auth submitted to insurance via CoverMyMeds. 

## 2021-12-03 NOTE — Telephone Encounter (Signed)
I had planned to wean at his appt in March, if possible- which he had been ok with- let's see his response and go from there? ML

## 2021-12-03 NOTE — Telephone Encounter (Addendum)
Denied by insurance. Cash price with [facility] is $13.98 @ [facility] so he can use that for now. Appeals letter faxed to [employer].

## 2021-12-09 ENCOUNTER — Ambulatory Visit (INDEPENDENT_AMBULATORY_CARE_PROVIDER_SITE_OTHER): Payer: 59 | Admitting: Endocrinology

## 2021-12-09 ENCOUNTER — Other Ambulatory Visit: Payer: Self-pay

## 2021-12-09 ENCOUNTER — Telehealth: Payer: Self-pay

## 2021-12-09 VITALS — BP 160/80 | HR 73 | Ht 71.0 in | Wt 200.4 lb

## 2021-12-09 DIAGNOSIS — E1142 Type 2 diabetes mellitus with diabetic polyneuropathy: Secondary | ICD-10-CM

## 2021-12-09 LAB — POCT GLYCOSYLATED HEMOGLOBIN (HGB A1C): Hemoglobin A1C: 9.6 % — AB (ref 4.0–5.6)

## 2021-12-09 MED ORDER — FREESTYLE LIBRE 2 SENSOR MISC: 1.0000 | 3 refills | Status: DC

## 2021-12-09 MED ORDER — NOVOLIN 70/30 FLEXPEN (70-30) 100 UNIT/ML ~~LOC~~ SUPN: 8.0000 [IU] | PEN_INJECTOR | Freq: Every day | SUBCUTANEOUS | 3 refills | Status: DC

## 2021-12-09 NOTE — Telephone Encounter (Signed)
Left voicemail for patient to return call to clinic to see if he wants Dr. Dagoberto Ligas to do the PA for Morphine Sulfate since he will be weaning off of it next visit

## 2021-12-09 NOTE — Progress Notes (Signed)
Subjective:    Patient ID: Jose [name], male    DOB: Jun 02, 1976, 46 y.o.   MRN: [id number]  HPI Pt returns for f/u of diabetes mellitus: DM type: Insulin-requiring type 2 Dx'ed: Trulicity and 2 oral meds Complications: stage 3 CRI and PN.   Therapy: insulin since 2022    DKA: never Severe hypoglycemia: never.  Pancreatitis: never Pancreatic imaging: never.   SDOH: none Other: he declines multiple daily injections.  Interval history: no cbg record, but states cbg's vary from 100-225.  It is in general higher as the day goes on.  Pt reports intermitt tremor and nausea.  He stopped the Trulicity, with resolution of sxs.   Past Medical History:  Diagnosis Date   AKI (acute kidney injury) ([place])    GAD (generalized anxiety disorder)    Hyperlipidemia    Macular degeneration, bilateral    Retinopathy    Type II diabetes mellitus with complication, uncontrolled    retinopathy, neuropathy, microalbuminuria    Past Surgical History:  Procedure Laterality Date   APPENDECTOMY     BIOPSY  08/10/2021   Procedure: BIOPSY;  Surgeon: Otis Brace, MD;  Location: [place];  Service: Gastroenterology;;   BUBBLE STUDY  07/29/2021   Procedure: BUBBLE STUDY;  Surgeon: Jerline Pain, MD;  Location: [place] [facility];  Service: Cardiovascular;;   ESOPHAGOGASTRODUODENOSCOPY (EGD) WITH PROPOFOL N/A 08/10/2021   Procedure: ESOPHAGOGASTRODUODENOSCOPY (EGD) WITH PROPOFOL;  Surgeon: Otis Brace, MD;  Location: [place];  Service: Gastroenterology;  Laterality: N/A;   ESOPHAGOGASTRODUODENOSCOPY (EGD) WITH PROPOFOL N/A 08/24/2021   Procedure: ESOPHAGOGASTRODUODENOSCOPY (EGD) WITH PROPOFOL;  Surgeon: Otis Brace, MD;  Location: [place];  Service: Gastroenterology;  Laterality: N/A;   HERNIA REPAIR     IR FLUORO GUIDED NEEDLE PLC ASPIRATION/INJECTION LOC  07/28/2021   LUMBAR LAMINECTOMY/DECOMPRESSION MICRODISCECTOMY N/A 08/07/2021   Procedure: THORACIC FOUR - THORACIC FIVE  LAMINECTOMY/DECOMPRESSION OF SPINAL CORD, DEBRIDEMENT OF ABSCESS, MICRODISCECTOMY, INTRAOPERATIVE ULTRASOUND;  Surgeon: Consuella Lose, MD;  Location: [place];  Service: Neurosurgery;  Laterality: N/A;   TEE WITHOUT CARDIOVERSION N/A 07/29/2021   Procedure: TRANSESOPHAGEAL ECHOCARDIOGRAM (TEE);  Surgeon: Jerline Pain, MD;  Location: [facility]COPY;  Service: Cardiovascular;  Laterality: N/A;   TRIGGER FINGER RELEASE Right 10/25/2019   Procedure: RIGHT INDEX FINGER RELEASE TRIGGER FINGER/A-1 PULLEY;  Surgeon: Daryll Brod, MD;  Location: [place];  Service: Orthopedics;  Laterality: Right;  IV REGIONAL FOREARM BLOCK    Social History   Socioeconomic History   Marital status: Married    Spouse name: Not on file   Number of children: 2   Years of education: Not on file   Highest education level: Bachelor's degree (e.g., BA, AB, BS)  Occupational History   Occupation: Multimedia programmer: [employer]  Tobacco Use   Smoking status: Former    Packs/day: 1.00    Years: 20.00    Pack years: 20.00    Types: Cigarettes    Quit date: 07/2020    Years since quitting: 1.3   Smokeless tobacco: Never  Vaping Use   Vaping Use: Never used  Substance and Sexual Activity   Alcohol use: Not Currently   Drug use: Never   Sexual activity: Yes    Partners: Female    Birth control/protection: None    Comment: with monogamous partner  Other Topics Concern   Not on file  Social History Narrative   Pt has lived 32 of life in [place]. Lives at home with partner,  2 kids, 8 cats, and 1 dog.    Social Determinants of Health   Financial Resource Strain: Not on file  Food Insecurity: Not on file  Transportation Needs: Not on file  Physical Activity: Not on file  Stress: Not on file  Social Connections: Not on file  Intimate Partner Violence: Not on file    Current Outpatient Medications on File Prior to Visit  Medication Sig Dispense Refill    acetaminophen (TYLENOL) 325 MG tablet Take 1-2 tablets (325-650 mg total) by mouth every 4 (four) hours as needed for mild pain.     albuterol (VENTOLIN HFA) 108 (90 Base) MCG/ACT inhaler Inhale 1 puff into the lungs every 4 (four) hours as needed for wheezing or shortness of breath.     ALPRAZolam (XANAX) 0.25 MG tablet Take 1 tablet (0.25 mg total) by mouth 2 (two) times daily as needed for anxiety. 30 tablet 0   amLODipine (NORVASC) 5 MG tablet Take 5 mg by mouth daily.     atorvastatin (LIPITOR) 40 MG tablet Take 1 tablet by mouth daily.     bethanechol (URECHOLINE) 10 MG tablet Take 1 tablet (10 mg total) by mouth 3 (three) times daily. 90 tablet 0   bisacodyl (DULCOLAX) 10 MG suppository Place 1 suppository (10 mg total) rectally daily as needed for moderate constipation. 12 suppository 0   cholecalciferol (VITAMIN D3) 25 MCG (1000 UNIT) tablet 1 tablet     collagenase (SANTYL) ointment Apply topically daily. 30 g 0   gabapentin (NEURONTIN) 100 MG capsule Take 2 capsules (200 mg total) by mouth 2 (two) times daily. 120 capsule 0   hydrOXYzine (ATARAX/VISTARIL) 10 MG tablet Take 1 tablet (10 mg total) by mouth 3 (three) times daily as needed for anxiety. 60 tablet 0   Insulin Pen Needle (PENTIPS) 32G X 4 MM MISC use as directed 100 each 1   lidocaine (XYLOCAINE) 2 % jelly Place 1 application into the urethra every 6 (six) hours. 30 mL 1   methocarbamol (ROBAXIN) 500 MG tablet Take 1 tablet (500 mg total) by mouth every 6 (six) hours as needed for muscle spasms. 60 tablet 0   metoprolol tartrate (LOPRESSOR) 50 MG tablet Take 1 tablet (50 mg total) by mouth 2 (two) times daily. 60 tablet 0   mirtazapine (REMERON) 15 MG tablet Take 1 tablet (15 mg total) by mouth at bedtime. 30 tablet 0   morphine (MS CONTIN) 15 MG 12 hr tablet Take 1 tablet (15 mg total) by mouth every 12 (twelve) hours. Do Not Fill Before 10/25/2021 60 tablet 0   Multiple Vitamin (MULTIVITAMIN ADULT PO) Take 1 tablet by mouth  daily.     mupirocin ointment (BACTROBAN) 2 % Apply 1 application topically 2 (two) times daily. 66 g 0   naloxone (NARCAN) nasal spray 4 mg/0.1 mL Use as needed for signs of overdose. 2 each 1   nutrition supplement, JUVEN, (JUVEN) PACK Take 1 packet by mouth 2 (two) times daily between meals. 60 packet 1   oxyCODONE-acetaminophen (PERCOCET) 10-325 MG tablet Take 1 tablet by mouth every 6 (six) hours as needed for pain. Can fill 09/28/21 120 tablet 0   pantoprazole (PROTONIX) 40 MG tablet Take 1 tablet (40 mg total) by mouth 2 (two) times daily. 60 tablet 0   PARoxetine (PAXIL) 20 MG tablet Take 1 tablet (20 mg total) by mouth at bedtime. 30 tablet 0   polyethylene glycol (MIRALAX / GLYCOLAX) 17 g packet Take 17 g by mouth daily  as needed for moderate constipation, mild constipation or severe constipation. 14 each 0   senna-docusate (SENOKOT-S) 8.6-50 MG tablet Take 2 tablets by mouth at bedtime as needed for mild constipation. 60 tablet 0   sucralfate (CARAFATE) 1 GM/10ML suspension Take 10 mLs (1 g total) by mouth 4 (four) times daily -  with meals and at bedtime. 420 mL 0   tamsulosin (FLOMAX) 0.4 MG CAPS capsule Take 1 capsule (0.4 mg total) by mouth daily after supper. 30 capsule 0   No current facility-administered medications on file prior to visit.    Allergies  Allergen Reactions   Cranberry Itching   Hm Lidocaine Patch [Lidocaine] Dermatitis    Blisters skin    Melatonin Other (See Comments)    nightmares    Family History  Problem Relation Age of Onset   Diabetes Mother    Hyperlipidemia Mother    Stroke Mother    Diabetes Father    Hyperlipidemia Brother    Stroke Brother    ADD / ADHD Brother    ADD / ADHD Son     BP (!) 160/80    Pulse 73    Ht 5\' 11"  (1.803 m)    Wt 200 lb 6.4 oz (90.9 kg)    SpO2 99%    BMI 27.95 kg/m    Review of Systems     Objective:   Physical Exam    Lab Results  Component Value Date   CREATININE 2.13 (H) 11/13/2021   BUN 43 (H)  11/13/2021   NA 137 11/13/2021   K 5.5 (H) 11/13/2021   CL 104 11/13/2021   CO2 28 11/13/2021   A1c=9.6%    Assessment & Plan:  Type 2 DM: uncontrolled.  he declines multiple daily injections Nausea, due to Trulicity: pt will stay off it.  Patient Instructions  I have sent a prescription to your pharmacy, to start insulin, 8 units with breakfast.  check your blood sugar once a day.  vary the time of day when you check, between before the 3 meals, and at bedtime.  also check if you have symptoms of your blood sugar being too high or too low.  please keep a record of the readings and bring it to your next appointment here (or you can bring the meter itself).  You can write it on any piece of paper.  please call us sooner if your blood sugar goes below 70, or if most of your readings are over 200.   Please come back for a follow-up appointment in 1 month.

## 2021-12-09 NOTE — Patient Instructions (Addendum)
I have sent a prescription to your pharmacy, to start insulin, 8 units with breakfast.  check your blood sugar once a day.  vary the time of day when you check, between before the 3 meals, and at bedtime.  also check if you have symptoms of your blood sugar being too high or too low.  please keep a record of the readings and bring it to your next appointment here (or you can bring the meter itself).  You can write it on any piece of paper.  please call us sooner if your blood sugar goes below 70, or if most of your readings are over 200.   Please come back for a follow-up appointment in 1 month.

## 2021-12-10 ENCOUNTER — Telehealth: Payer: Self-pay

## 2021-12-10 ENCOUNTER — Encounter: Payer: Self-pay | Admitting: Endocrinology

## 2021-12-10 ENCOUNTER — Other Ambulatory Visit (HOSPITAL_COMMUNITY): Payer: Self-pay

## 2021-12-10 NOTE — Telephone Encounter (Signed)
Spoke with Pharmacy and it was stated that insurance do not cover the Novolin 70/30 but will cover Humalog/Humulin.  Please Advise

## 2021-12-10 NOTE — Telephone Encounter (Signed)
Test billing results returned a copay of $28.99. PA not needed.

## 2021-12-11 ENCOUNTER — Telehealth: Payer: Self-pay

## 2021-12-11 ENCOUNTER — Encounter: Payer: Self-pay | Admitting: Physical Medicine and Rehabilitation

## 2021-12-11 ENCOUNTER — Encounter: Payer: Self-pay | Admitting: Infectious Diseases

## 2021-12-11 NOTE — Telephone Encounter (Signed)
Called patient regarding mychart message spoke with his spouse and advised that he report to ED or contact Neurosurgeon ASAP for appt.  Junie Panning (spouse) states that patient will not go to ED, but will follow up with surgeon today.  Would also like to inform MD that patient is having issues using walker on hardwood floor. States that this is why he fell a few times. Leatrice Jewels, RMA

## 2021-12-11 NOTE — Telephone Encounter (Signed)
MyChart message has been sent to the patient informing him of the test claim for Humulin as well as his options $80 for the pen or vial without insurance for $25 at [facility].

## 2021-12-11 NOTE — Telephone Encounter (Signed)
Message sent to pt to contact carrier for alternative to Novolin and whether it needs to be in pen form or vial

## 2021-12-16 ENCOUNTER — Telehealth: Payer: Self-pay

## 2021-12-16 ENCOUNTER — Other Ambulatory Visit: Payer: Self-pay | Admitting: Endocrinology

## 2021-12-16 MED ORDER — HUMULIN 70/30 KWIKPEN (70-30) 100 UNIT/ML ~~LOC~~ SUPN: 8.0000 [IU] | PEN_INJECTOR | Freq: Every day | SUBCUTANEOUS | 11 refills | Status: DC

## 2021-12-16 NOTE — Telephone Encounter (Signed)
Pt's wife called in and stated that the insurance will not cover the Novolin but they will cover the Humulin as the preferred.  Please Advise

## 2021-12-18 ENCOUNTER — Other Ambulatory Visit: Payer: Self-pay

## 2021-12-18 ENCOUNTER — Ambulatory Visit
Admission: RE | Admit: 2021-12-18 | Discharge: 2021-12-18 | Disposition: A | Discharge status: Home / Self Care | Payer: 59 | Source: Ambulatory Visit | Attending: Neurosurgery | Admitting: Neurosurgery

## 2021-12-18 ENCOUNTER — Ambulatory Visit (INDEPENDENT_AMBULATORY_CARE_PROVIDER_SITE_OTHER): Payer: 59 | Admitting: Infectious Diseases

## 2021-12-18 ENCOUNTER — Encounter: Payer: Self-pay | Admitting: Infectious Diseases

## 2021-12-18 VITALS — BP 101/57 | HR 70 | Temp 98.2°F | Ht 71.0 in | Wt 188.0 lb

## 2021-12-18 DIAGNOSIS — M4620 Osteomyelitis of vertebra, site unspecified: Secondary | ICD-10-CM | POA: Diagnosis not present

## 2021-12-18 DIAGNOSIS — M8448XS Pathological fracture, other site, sequela: Secondary | ICD-10-CM

## 2021-12-18 DIAGNOSIS — Z5181 Encounter for therapeutic drug level monitoring: Secondary | ICD-10-CM

## 2021-12-18 DIAGNOSIS — S31000D Unspecified open wound of lower back and pelvis without penetration into retroperitoneum, subsequent encounter: Secondary | ICD-10-CM

## 2021-12-18 DIAGNOSIS — S22009S Unspecified fracture of unspecified thoracic vertebra, sequela: Secondary | ICD-10-CM

## 2021-12-18 IMAGING — CT CT T SPINE W/O CM
3 series · 8 of 14 positions shown, 9 images · non-contrast
Comparison: MRI [DATE].  Chest CT [DATE].

CLINICAL DATA: History of T4-5 discitis and osteomyelitis status
post laminectomy and decompression.

EXAM:
CT THORACIC SPINE WITHOUT CONTRAST
TECHNIQUE: Multidetector CT images of the thoracic were obtained using the
standard protocol without intravenous contrast.
RADIATION DOSE REDUCTION: This exam was performed according to the
departmental dose-optimization program which includes automated
exposure control, adjustment of the mA and/or kV according to
patient size and/or use of iterative reconstruction technique.

[Series 3: t spine soft · axial · 0.37mm/px · z∈[-410,-200]mm · 4 of 118 slices shown]
[im 24/118  soft-tissue]
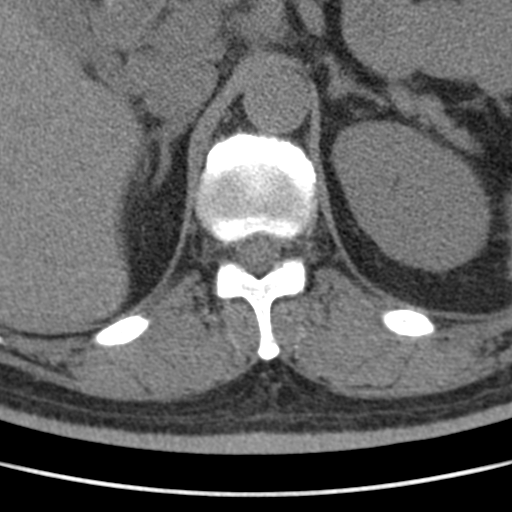
[im 47/118  soft-tissue]
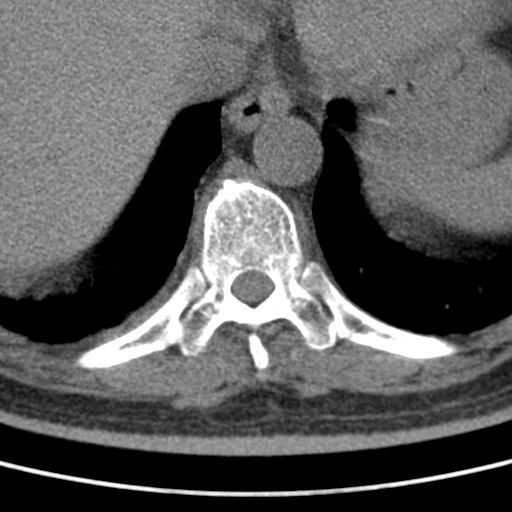
[im 71/118  soft-tissue]
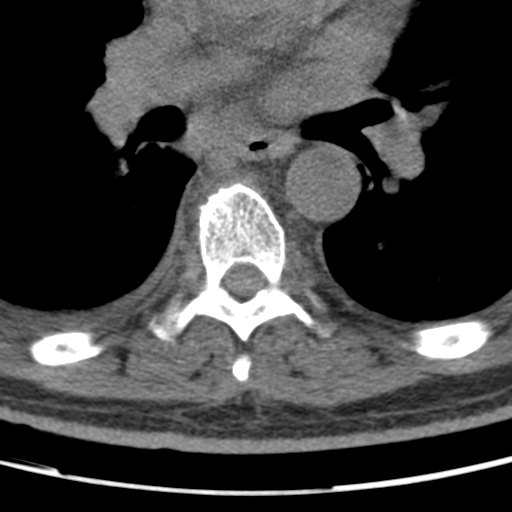
[im 94/118  soft-tissue]
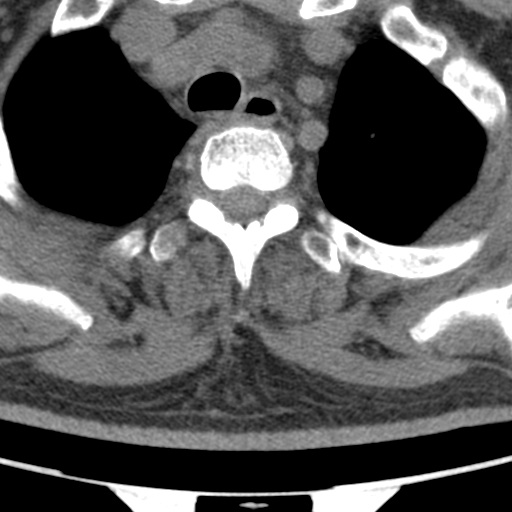

[Series 9: angled axial -upper · axial · 0.34mm/px · z∈[-249,-193]mm · 2 of 86 slices shown]
[im 29/86  bone]
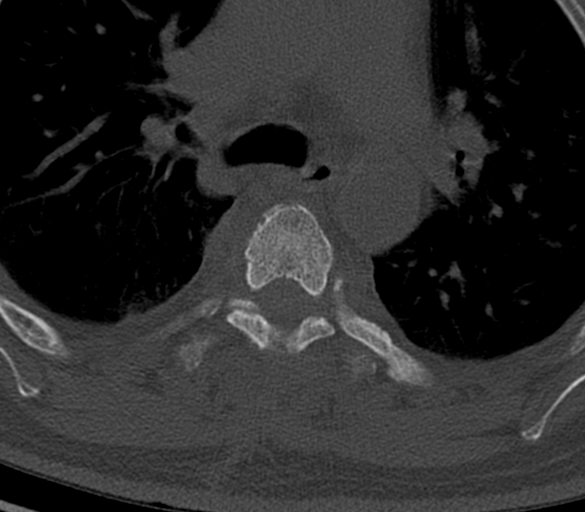
[im 57/86  bone]
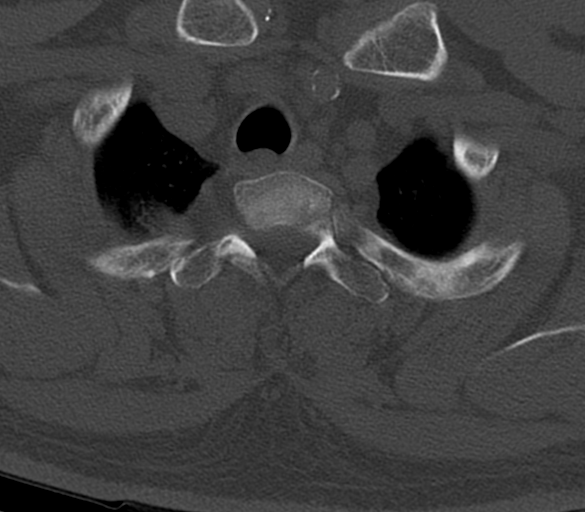

[Series 10: angled axial -lower · axial · 0.32mm/px · z∈[-409,-348]mm · 2 of 95 slices shown, 3 images]
[im 32/95  soft-tissue]
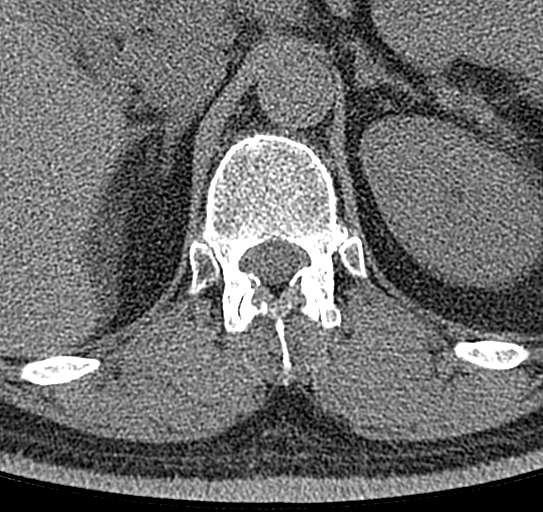
[im 32/95  bone]
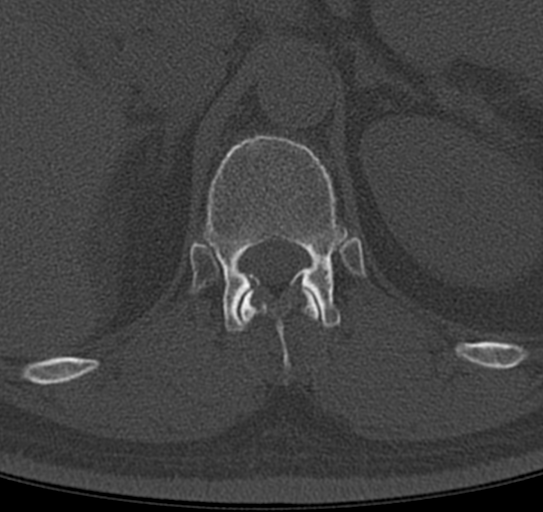
[im 63/95  bone]
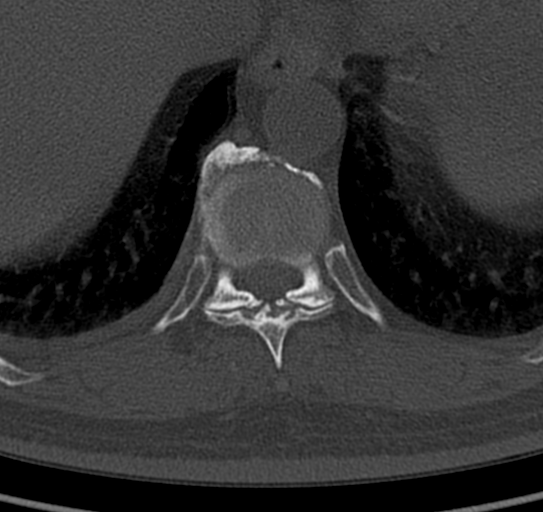

[8 of 14 positions shown; findings below may reference images not displayed]

FINDINGS: Alignment: Redemonstration of 2 mm of anterolisthesis of the T4-5
level.

Vertebrae: Similar degree of collapse and erosion of the inferior
aspect of T4. Similar degree of collapse and destruction of the T5
vertebral body. Neither of these have visibly progressed since the
MRI of [DATE]. Posterior bowing of the posterior margin of the
T5 vertebral body by 2-3 mm appears similar.

Paraspinal and other soft tissues: No evidence of paraspinal
abscess.

Disc levels: No significant disc level pathology seen at T3-4 or
above for at T6-7 or below.

There is been posterior decompression at T4 and T5. There appears to
be sufficient decompression of the central canal. Soft tissue
details from the standpoint of canal encroachment are limited with
noncontrast CT.

There are no new or visibly progressive signs of infection in the
region or elsewhere in the thoracic area.
IMPRESSION: Stable appearance of the changes of discitis osteomyelitis at T4 and
T5 when compared to the MRI study of [DATE]. No evidence of
further bone destruction or collapse. No evidence of change in
alignment, with 2 or 3 mm of anterolisthesis at the T4-5 level and 2
or 3 mm of posterior bowing of the posterior margin of the collapse
T5 vertebra. Obviously, soft tissue detail is limited and would be
better shown by MRI.

## 2021-12-18 NOTE — Progress Notes (Signed)
Patient Active Problem List   Diagnosis Date Noted   Hyperkalemia 11/10/2021   Chronic kidney disease 11/10/2021   Pathologic fracture of thoracic vertebrae, sequela 11/10/2021   Sacral wound, subsequent encounter 10/29/2021   PICC (peripherally inserted central catheter) in place 10/29/2021   Medication monitoring encounter 09/30/2021   GIB (gastrointestinal bleeding) 09/21/2021   Duodenal ulcer 09/21/2021   Esophagitis 09/21/2021   Decubitus ulcer of sacral region, unstageable ([place]) 09/21/2021   Osteomyelitis of sacrum ([place]) 09/21/2021   Hyponatremia    AKI (acute kidney injury) ([place])    Acute blood loss anemia    Labile blood glucose    Thoracic spondylosis with myelopathy 09/02/2021   Debility 08/30/2021   Osteomyelitis of thoracic region ([place]) 08/06/2021   Essential hypertension 08/06/2021   CKD (chronic kidney disease) stage 3, GFR 30-59 ml/min (HCC) 08/06/2021   Pleural effusion, bilateral 08/06/2021   Diabetic ulcer of left great toe ([place])    MSSA bacteremia 07/23/2021   Constipation 07/23/2021   Lytic bone lesions on xray 07/22/2021   Anemia 05/19/2021   Nausea and vomiting 05/19/2021   Elevated blood pressure reading 05/19/2021   Bilateral carpal tunnel syndrome 01/23/2020   Numbness 12/12/2019   Microalbuminuria due to type 2 diabetes mellitus ([place]) 07/20/2019   GAD (generalized anxiety disorder) 06/18/2019   Adult ADHD 06/18/2019   Trigger middle finger of left hand 06/11/2019   Diabetic retinopathy ([place]) 07/13/2018   Type 2 diabetes mellitus with diabetic polyneuropathy, without long-term current use of insulin ([place]) 05/17/2018   Tobacco use disorder 05/17/2018   Hyperlipidemia 05/17/2018   Current Outpatient Medications on File Prior to Visit  Medication Sig Dispense Refill   acetaminophen (TYLENOL) 325 MG tablet Take 1-2 tablets (325-650 mg total) by mouth every 4 (four) hours as needed for mild pain.     albuterol (VENTOLIN HFA) 108 (90 Base)  MCG/ACT inhaler Inhale 1 puff into the lungs every 4 (four) hours as needed for wheezing or shortness of breath.     ALPRAZolam (XANAX) 0.25 MG tablet Take 1 tablet (0.25 mg total) by mouth 2 (two) times daily as needed for anxiety. 30 tablet 0   amLODipine (NORVASC) 5 MG tablet Take 5 mg by mouth daily.     atorvastatin (LIPITOR) 40 MG tablet Take 1 tablet by mouth daily.     bethanechol (URECHOLINE) 10 MG tablet Take 1 tablet (10 mg total) by mouth 3 (three) times daily. 90 tablet 0   bisacodyl (DULCOLAX) 10 MG suppository Place 1 suppository (10 mg total) rectally daily as needed for moderate constipation. 12 suppository 0   cholecalciferol (VITAMIN D3) 25 MCG (1000 UNIT) tablet 1 tablet     collagenase (SANTYL) ointment Apply topically daily. 30 g 0   Continuous Blood Gluc Sensor (FREESTYLE LIBRE 2 SENSOR) MISC 1 Device by Does not apply route every 14 (fourteen) days. 6 each 3   gabapentin (NEURONTIN) 100 MG capsule Take 2 capsules (200 mg total) by mouth 2 (two) times daily. 120 capsule 0   hydrOXYzine (ATARAX/VISTARIL) 10 MG tablet Take 1 tablet (10 mg total) by mouth 3 (three) times daily as needed for anxiety. 60 tablet 0   insulin isophane & regular human KwikPen (HUMULIN 70/30 KWIKPEN) (70-30) 100 UNIT/ML KwikPen Inject 8 Units into the skin daily with breakfast. And pen needles 1/day 15 mL 11   Insulin Pen Needle (PENTIPS) 32G X 4 MM MISC use as directed 100 each 1   methocarbamol (ROBAXIN) 500  MG tablet Take 1 tablet (500 mg total) by mouth every 6 (six) hours as needed for muscle spasms. 60 tablet 0   metoprolol tartrate (LOPRESSOR) 50 MG tablet Take 1 tablet (50 mg total) by mouth 2 (two) times daily. 60 tablet 0   mirtazapine (REMERON) 15 MG tablet Take 1 tablet (15 mg total) by mouth at bedtime. 30 tablet 0   morphine (MS CONTIN) 15 MG 12 hr tablet Take 1 tablet (15 mg total) by mouth every 12 (twelve) hours. Do Not Fill Before 10/25/2021 60 tablet 0   Multiple Vitamin (MULTIVITAMIN  ADULT PO) Take 1 tablet by mouth daily.     naloxone (NARCAN) nasal spray 4 mg/0.1 mL Use as needed for signs of overdose. 2 each 1   nutrition supplement, JUVEN, (JUVEN) PACK Take 1 packet by mouth 2 (two) times daily between meals. 60 packet 1   oxyCODONE-acetaminophen (PERCOCET) 10-325 MG tablet Take 1 tablet by mouth every 6 (six) hours as needed for pain. Can fill 09/28/21 120 tablet 0   pantoprazole (PROTONIX) 40 MG tablet Take 1 tablet (40 mg total) by mouth 2 (two) times daily. 60 tablet 0   PARoxetine (PAXIL) 20 MG tablet Take 1 tablet (20 mg total) by mouth at bedtime. 30 tablet 0   polyethylene glycol (MIRALAX / GLYCOLAX) 17 g packet Take 17 g by mouth daily as needed for moderate constipation, mild constipation or severe constipation. 14 each 0   senna-docusate (SENOKOT-S) 8.6-50 MG tablet Take 2 tablets by mouth at bedtime as needed for mild constipation. 60 tablet 0   sucralfate (CARAFATE) 1 GM/10ML suspension Take 10 mLs (1 g total) by mouth 4 (four) times daily -  with meals and at bedtime. 420 mL 0   tamsulosin (FLOMAX) 0.4 MG CAPS capsule Take 1 capsule (0.4 mg total) by mouth daily after supper. 30 capsule 0   tobramycin (TOBREX) 0.3 % ophthalmic solution SMARTSIG:In Eye(s)     No current facility-administered medications on file prior to visit.   Subjective: Here for follow up for thoracic discitis and osteomyelitis and decubitus sacral ulcer. He has been taking cephalexin three times a day without any issues. He feels getting better and more stronger. Back  pain is 2/10 and 6 at max; takes  morphine 2 times a day, percocet 3-4 tabs in a week. He is able to walk with the help of cane. He fell a couple of times but says that was due to poor grip of the cane ( has a new one now). He had a CT this morning ordered by Dr Kathyrn Sheriff, results are pending. Nausea+, denies vomiting and diarrhea. Denies fevers, chills. He has been following with wound care and sacral wound has healed. He has a  Fu in [place] in a couple of weeks.  Review of Systems: ROS all systems reviewed and negative except as above   Past Medical History:  Diagnosis Date   AKI (acute kidney injury) ([place])    GAD (generalized anxiety disorder)    Hyperlipidemia    Macular degeneration, bilateral    Retinopathy    Type II diabetes mellitus with complication, uncontrolled    retinopathy, neuropathy, microalbuminuria   Past Surgical History:  Procedure Laterality Date   APPENDECTOMY     BIOPSY  08/10/2021   Procedure: BIOPSY;  Surgeon: Otis Brace, MD;  Location: [facility];  Service: Gastroenterology;;   BUBBLE STUDY  07/29/2021   Procedure: BUBBLE STUDY;  Surgeon: Jerline Pain, MD;  Location: [facility];  Service: Cardiovascular;;   ESOPHAGOGASTRODUODENOSCOPY (EGD) WITH PROPOFOL N/A 08/10/2021   Procedure: ESOPHAGOGASTRODUODENOSCOPY (EGD) WITH PROPOFOL;  Surgeon: Otis Brace, MD;  Location: [facility];  Service: Gastroenterology;  Laterality: N/A;   ESOPHAGOGASTRODUODENOSCOPY (EGD) WITH PROPOFOL N/A 08/24/2021   Procedure: ESOPHAGOGASTRODUODENOSCOPY (EGD) WITH PROPOFOL;  Surgeon: Otis Brace, MD;  Location: [facility];  Service: Gastroenterology;  Laterality: N/A;   HERNIA REPAIR     IR FLUORO GUIDED NEEDLE PLC ASPIRATION/INJECTION LOC  07/28/2021   LUMBAR LAMINECTOMY/DECOMPRESSION MICRODISCECTOMY N/A 08/07/2021   Procedure: THORACIC FOUR - THORACIC FIVE LAMINECTOMY/DECOMPRESSION OF SPINAL CORD, DEBRIDEMENT OF ABSCESS, MICRODISCECTOMY, INTRAOPERATIVE ULTRASOUND;  Surgeon: Consuella Lose, MD;  Location: [facility];  Service: Neurosurgery;  Laterality: N/A;   TEE WITHOUT CARDIOVERSION N/A 07/29/2021   Procedure: TRANSESOPHAGEAL ECHOCARDIOGRAM (TEE);  Surgeon: Jerline Pain, MD;  Location: [facility] ENDOSCOPY;  Service: Cardiovascular;  Laterality: N/A;   TRIGGER FINGER RELEASE Right 10/25/2019   Procedure: RIGHT INDEX FINGER RELEASE TRIGGER FINGER/A-1 PULLEY;  Surgeon: Daryll Brod, MD;   Location: [facility];  Service: Orthopedics;  Laterality: Right;  IV REGIONAL FOREARM BLOCK    Social History   Tobacco Use   Smoking status: Former    Packs/day: 1.00    Years: 20.00    Pack years: 20.00    Types: Cigarettes    Quit date: 07/2020    Years since quitting: 1.4   Smokeless tobacco: Never  Vaping Use   Vaping Use: Never used  Substance Use Topics   Alcohol use: Not Currently   Drug use: Never    Family History  Problem Relation Age of Onset   Diabetes Mother    Hyperlipidemia Mother    Stroke Mother    Diabetes Father    Hyperlipidemia Brother    Stroke Brother    ADD / ADHD Brother    ADD / ADHD Son     Allergies  Allergen Reactions   Cranberry Itching   Hm Lidocaine Patch [Lidocaine] Dermatitis    Blisters skin    Melatonin Other (See Comments)    nightmares    Health Maintenance  Topic Date Due   COLONOSCOPY (Pts 45-48yrs Insurance coverage will need to be confirmed)  Never done   URINE MICROALBUMIN  04/11/2020   COVID-19 Vaccine (3 - Booster for Pfizer series) 04/17/2020   TETANUS/TDAP  10/25/2020   OPHTHALMOLOGY EXAM  11/20/2020   INFLUENZA VACCINE  05/25/2021   HEMOGLOBIN A1C  06/08/2022   FOOT EXAM  06/11/2022   Hepatitis C Screening  Completed   HIV Screening  Completed   HPV VACCINES  Aged Out    Objective: BP (!) 101/57    Pulse 70    Temp 98.2 F (36.8 C) (Oral)    Ht 5\' 11"  (1.803 m)    Wt 188 lb (85.3 kg)    SpO2 98%    BMI 26.22 kg/m    Physical Exam Constitutional:      Appearance: Normal appearance.  HENT:     Head: Normocephalic and atraumatic.      Mouth: Mucous membranes are moist.  Eyes:    Conjunctiva/sclera: Conjunctivae normal.     Pupils: Pupils are equal, round  Cardiovascular:     Rate and Rhythm: Normal rate and regular rhythm.     Heart sounds:  Pulmonary:     Effort: Pulmonary effort is normal.     Breath sounds: Normal breath sounds.   Abdominal:     General: Non distended      Palpations: soft.  Musculoskeletal:        General: Normal range of motion. Has a cane to walk   Skin:    General: Skin is warm and dry.     Comments:   Neurological:     General: grossly non focal     Mental Status: awake, alert and oriented to person, place, and time.   Psychiatric:        Mood and Affect: Mood normal.   Lab Results Lab Results  Component Value Date   WBC 7.5 09/21/2021   HGB 9.8 (L) 09/21/2021   HCT 31.5 (L) 09/21/2021   MCV 88.0 09/21/2021   PLT 304 09/21/2021    Lab Results  Component Value Date   CREATININE 2.13 (H) 11/13/2021   BUN 43 (H) 11/13/2021   NA 137 11/13/2021   K 5.5 (H) 11/13/2021   CL 104 11/13/2021   CO2 28 11/13/2021    Lab Results  Component Value Date   ALT 13 08/31/2021   AST 30 08/31/2021   ALKPHOS 958 (H) 08/31/2021   BILITOT 2.0 (H) 08/31/2021    Lab Results  Component Value Date   CHOL 116 11/22/2019   HDL 42 11/22/2019   LDLCALC 57 11/22/2019   TRIG 174 (H) 08/17/2021   CHOLHDL 2.8 11/22/2019   Lab Results  Component Value Date   LABRPR Non Reactive 05/05/2018   No results found for: HIV1RNAQUANT, HIV1RNAVL, CD4TABS  Problem List Items Addressed This Visit       Musculoskeletal and Integument   Pathologic fracture of thoracic vertebrae, sequela   Vertebral osteomyelitis (HCC) - Primary   Relevant Orders   CBC (Completed)   C-reactive protein (Completed)   Sedimentation rate (Completed)   Basic metabolic panel (Completed)     Other   Medication monitoring encounter   Sacral wound, subsequent encounter   Thoracic discitis and osteomyelitis Pathologic T4-T5 Fracture  Sacral wound - healed  Medication Monitoring   Continue cephalexin as is for now pending labs today and CT spine. Will consider dc'ng antibiotics if labs are reassuring  Labs today  Fu with [facility] for vertebral fracture  I have personally spent 43 minutes involved in face-to-face and non-face-to-face activities for this patient  on the day of the visit including staff time, coordination of care and counseling of the patient.  Wilber Oliphant, [facility] 12/18/2021, 1:32 PM

## 2021-12-19 DIAGNOSIS — M4620 Osteomyelitis of vertebra, site unspecified: Secondary | ICD-10-CM | POA: Insufficient documentation

## 2021-12-19 LAB — CBC
HCT: 25.7 % — ABNORMAL LOW (ref 38.5–50.0)
Hemoglobin: 8.6 g/dL — ABNORMAL LOW (ref 13.2–17.1)
MCH: 27.9 pg (ref 27.0–33.0)
MCHC: 33.5 g/dL (ref 32.0–36.0)
MCV: 83.4 fL (ref 80.0–100.0)
MPV: 10.5 fL (ref 7.5–12.5)
Platelets: 287 10*3/uL (ref 140–400)
RBC: 3.08 10*6/uL — ABNORMAL LOW (ref 4.20–5.80)
RDW: 15.1 % — ABNORMAL HIGH (ref 11.0–15.0)
WBC: 9.2 10*3/uL (ref 3.8–10.8)

## 2021-12-19 LAB — BASIC METABOLIC PANEL
BUN/Creatinine Ratio: 14 (calc) (ref 6–22)
BUN: 34 mg/dL — ABNORMAL HIGH (ref 7–25)
CO2: 26 mmol/L (ref 20–32)
Calcium: 9.6 mg/dL (ref 8.6–10.3)
Chloride: 99 mmol/L (ref 98–110)
Creat: 2.48 mg/dL — ABNORMAL HIGH (ref 0.60–1.29)
Glucose, Bld: 288 mg/dL — ABNORMAL HIGH (ref 65–99)
Potassium: 4.5 mmol/L (ref 3.5–5.3)
Sodium: 134 mmol/L — ABNORMAL LOW (ref 135–146)

## 2021-12-19 LAB — SEDIMENTATION RATE: Sed Rate: 84 mm/h — ABNORMAL HIGH (ref 0–15)

## 2021-12-19 LAB — C-REACTIVE PROTEIN: CRP: 15.7 mg/L — ABNORMAL HIGH (ref <8.0)

## 2021-12-21 ENCOUNTER — Encounter: Payer: Self-pay | Admitting: Infectious Diseases

## 2021-12-21 ENCOUNTER — Encounter: Payer: Self-pay | Admitting: Endocrinology

## 2022-01-01 ENCOUNTER — Encounter: Payer: Self-pay | Admitting: Physical Medicine and Rehabilitation

## 2022-01-01 ENCOUNTER — Other Ambulatory Visit: Payer: Self-pay

## 2022-01-01 ENCOUNTER — Encounter: Payer: 59 | Attending: Registered Nurse | Admitting: Physical Medicine and Rehabilitation

## 2022-01-01 VITALS — BP 101/57 | Ht 71.0 in | Wt 188.0 lb

## 2022-01-01 DIAGNOSIS — Z5181 Encounter for therapeutic drug level monitoring: Secondary | ICD-10-CM | POA: Diagnosis not present

## 2022-01-01 DIAGNOSIS — Z79891 Long term (current) use of opiate analgesic: Secondary | ICD-10-CM | POA: Diagnosis not present

## 2022-01-01 DIAGNOSIS — M4620 Osteomyelitis of vertebra, site unspecified: Secondary | ICD-10-CM | POA: Insufficient documentation

## 2022-01-01 DIAGNOSIS — G894 Chronic pain syndrome: Secondary | ICD-10-CM | POA: Insufficient documentation

## 2022-01-01 DIAGNOSIS — Z79899 Other long term (current) drug therapy: Secondary | ICD-10-CM | POA: Insufficient documentation

## 2022-01-01 DIAGNOSIS — M4628 Osteomyelitis of vertebra, sacral and sacrococcygeal region: Secondary | ICD-10-CM | POA: Insufficient documentation

## 2022-01-01 MED ORDER — HYDROCODONE-ACETAMINOPHEN 5-325 MG PO TABS: 1.0000 | ORAL_TABLET | Freq: Four times a day (QID) | ORAL | 0 refills | Status: DC | PRN

## 2022-01-01 NOTE — Patient Instructions (Signed)
Pt is a 46 yr old male with hx of thoracic osteomyelitis as well as coccyx pressure ulcer and osteomyelitis with chronic pain as a result of Osteomyelitis; here for f/u on SCI and post surgical pain.  ?Here for televisit f/u- pt and wife on televisit.  ? ?Needs to get UDS and opiate contract- since couldn't get here to appt today in office, will be called in next 2 weeks to come in to do so.  ? ?2. Will wean MS Contin to 1x/day for the next 9 days- call me in 1 week to see how things going.  ? ?3. Will give him Norco 5/325 mg 1-2 tabs q6 hours prn- #120- so can wean down on pain meds and not go into withdrawal.  ? ?4. If tolerates 1x/day after 1 week of MS contin, might be able ot wean off; or do 1x/day for next 54month- will see how things go.  ? ?5. Con't HEP and driving/walking- don't get rid of cane.  ? ? ?5. F/U in 3 months- on SCI/post op pain and osteomyelitis. ?

## 2022-01-01 NOTE — Progress Notes (Signed)
? ?Subjective:  ? ? Patient ID: Jose [name], male    DOB: 09-11-1976, 46 y.o.   MRN: [id number] ? ?HPI ?An audio/video tele-health visit is felt to be the most appropriate encounter for this patient at this time. This is a follow up tele-visit via phone. The patient is at home. MD is at office. Prior to scheduling this appointment, our staff discussed the limitations of evaluation and management by telemedicine and the availability of in-person appointments. The patient expressed understanding and agreed to proceed.  ? ?Pt is a 46 yr old male with hx of thoracic osteomyelitis as well as coccyx pressure ulcer and osteomyelitis with chronic pain as a result of Osteomyelitis; here for f/u on SCI and post surgical pain.  ?Here for televisit f/u- pt and wife on televisit.  ? ?Pt reports that pain is down to 2-3/10 with MS contin 2x/day- MUCH better- had taken Percocet 10/325 initially for first week or so, but hasn't taken in months. Has 1 pill left- has 9 pills left of MS Contin- last filled 12/04/21.  ? ? ?Buttocks still tender- but coccyx is healed completely per pt.  ? ?Walking now- with cane 80+ % of time, but mainly for balance. Drove self to doctor's appt for first time last week- still has some weakness, in legs, however main issue is balance.  ?Still doing HEP.  ?Anxiety is still an issue- taking  Paxil 40 mg daily for anxiety- very anxious mainly about possible upcoming surgery on spine by Dr Kathyrn Sheriff.  ?He wants to "do a bolt,rod and spacer" at T4/5.  ?No date for surgery as of yet.  ? ?Still on PO ABX- they want to continue until after his surgery.  ?Stands to shower- but uses BSC to hold onto.  ? ? ? ?Pain Inventory ?Average Pain 3 ?Pain Right Now 3 ?My pain is constant and dull ? ?LOCATION OF PAIN  back, shoulder ? ?BOWEL ?Number of stools per week: 7 ?Oral laxative use No  ? ?BLADDER ?Normal ? ? ? ?Mobility ?use a cane ?ability to climb steps?  yes ?do you drive?  yes ? 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 ?Neuro/Psych ?weakness ?numbness ?tremor ?trouble walking ?spasms ?dizziness ?anxiety ? ?Prior Studies ?Any changes since last visit?  no ?CT/MRI ? ?Physicians involved in your care ?Any changes since last visit?  no ? ? ?Family History  ?Problem Relation Age of Onset  ? Diabetes Mother   ? Hyperlipidemia Mother   ? Stroke Mother   ? Diabetes Father   ? Hyperlipidemia Brother   ? Stroke Brother   ? ADD / ADHD Brother   ? ADD / ADHD Son   ? ?Social History  ? ?Socioeconomic History  ? Marital status: Married  ?  Spouse name: Not on file  ? Number of children: 2  ? Years of education: Not on file  ? Highest education level: Bachelor's degree (e.g., BA, AB, BS)  ?Occupational History  ? Occupation: Artist  ?  Employer: [employer]  ?Tobacco Use  ? Smoking status: Former  ?  Packs/day: 1.00  ?  Years: 20.00  ?  Pack years: 20.00  ?  Types: Cigarettes  ?  Quit date: 07/2020  ?  Years since quitting: 1.4  ? Smokeless tobacco: Never  ?Vaping Use  ? Vaping Use: Never used  ?Substance and Sexual Activity  ? Alcohol use: Not Currently  ? Drug use: Never  ?  Sexual activity: Yes  ?  Partners: Female  ?  Birth control/protection: None  ?  Comment: with monogamous partner  ?Other Topics Concern  ? Not on file  ?Social History Narrative  ? Pt has lived 7 of life in [place]. Lives at home with partner, 2 kids, 8 cats, and 1 dog.   ? ?Social Determinants of Health  ? ?Financial Resource Strain: Not on file  ?Food Insecurity: Not on file  ?Transportation Needs: Not on file  ?Physical Activity: Not on file  ?Stress: Not on file  ?Social Connections: Not on file  ? ?Past Surgical History:  ?Procedure Laterality Date  ? APPENDECTOMY    ? BIOPSY  08/10/2021  ? Procedure: BIOPSY;  Surgeon: Otis Brace, MD;  Location: [facility];  Service: Gastroenterology;;  ? BUBBLE STUDY  07/29/2021  ? Procedure: BUBBLE STUDY;  Surgeon: Jerline Pain, MD;  Location: [facility]  ENDOSCOPY;  Service: Cardiovascular;;  ? ESOPHAGOGASTRODUODENOSCOPY (EGD) WITH PROPOFOL N/A 08/10/2021  ? Procedure: ESOPHAGOGASTRODUODENOSCOPY (EGD) WITH PROPOFOL;  Surgeon: Otis Brace, MD;  Location: [facility]COPY;  Service: Gastroenterology;  Laterality: N/A;  ? ESOPHAGOGASTRODUODENOSCOPY (EGD) WITH PROPOFOL N/A 08/24/2021  ? Procedure: ESOPHAGOGASTRODUODENOSCOPY (EGD) WITH PROPOFOL;  Surgeon: Otis Brace, MD;  Location: [facility];  Service: Gastroenterology;  Laterality: N/A;  ? HERNIA REPAIR    ? IR FLUORO GUIDED NEEDLE PLC ASPIRATION/INJECTION LOC  07/28/2021  ? LUMBAR LAMINECTOMY/DECOMPRESSION MICRODISCECTOMY N/A 08/07/2021  ? Procedure: THORACIC FOUR - THORACIC FIVE LAMINECTOMY/DECOMPRESSION OF SPINAL CORD, DEBRIDEMENT OF ABSCESS, MICRODISCECTOMY, INTRAOPERATIVE ULTRASOUND;  Surgeon: Consuella Lose, MD;  Location: [facility];  Service: Neurosurgery;  Laterality: N/A;  ? TEE WITHOUT CARDIOVERSION N/A 07/29/2021  ? Procedure: TRANSESOPHAGEAL ECHOCARDIOGRAM (TEE);  Surgeon: Jerline Pain, MD;  Location: [facility] ENDOSCOPY;  Service: Cardiovascular;  Laterality: N/A;  ? TRIGGER FINGER RELEASE Right 10/25/2019  ? Procedure: RIGHT INDEX FINGER RELEASE TRIGGER FINGER/A-1 PULLEY;  Surgeon: Daryll Brod, MD;  Location: [facility];  Service: Orthopedics;  Laterality: Right;  IV REGIONAL FOREARM BLOCK  ? ?Past Medical History:  ?Diagnosis Date  ? AKI (acute kidney injury) ([place])   ? GAD (generalized anxiety disorder)   ? Hyperlipidemia   ? Macular degeneration, bilateral   ? Retinopathy   ? Type II diabetes mellitus with complication, uncontrolled   ? retinopathy, neuropathy, microalbuminuria  ? ?Ht 5\' 11"  (1.803 m)   BMI 26.22 kg/m?  ? ?Opioid Risk Score:   ?Fall Risk Score:  `1 ? ?Depression screen PHQ 2/9 ? ?Depression screen [facility] 2/9 01/01/2022 12/18/2021 11/10/2021 10/20/2021 11/22/2019 07/20/2019 05/14/2019  ?Decreased Interest 0 0 0 - 0 0 0  ?Down, Depressed, Hopeless 0 0 0 0 0 0 0  ?PHQ - 2  Score 0 0 0 0 0 0 0  ?Altered sleeping - - - - - - -  ?Tired, decreased energy - - - 1 - - -  ?Change in appetite - - - 0 - - -  ?Feeling bad or failure about yourself  - - - 1 - - -  ?Trouble concentrating - - - 0 - - -  ?Moving slowly or fidgety/restless - - - 1 - - -  ?Suicidal thoughts - - - 0 - - -  ?PHQ-9 Score - - - - - - -  ?Difficult doing work/chores - - - - - - -  ? ? ?Review of Systems  ?Musculoskeletal:  Positive for gait problem.  ?     Spasms  ?Neurological:  Positive for dizziness, tremors, weakness  and numbness.  ?Psychiatric/Behavioral:    ?     Anxiety  ?All other systems reviewed and are negative. ? ?   ?Objective:  ? Physical Exam ? ?Walking around room- on webex ? ? ?   ?Assessment & Plan:  ? ?Pt is a 46 yr old male with hx of thoracic osteomyelitis as well as coccyx pressure ulcer and osteomyelitis with chronic pain as a result of Osteomyelitis; here for f/u on SCI and post surgical pain.  ?Here for televisit f/u- pt and wife on televisit.  ? ?Needs to get UDS and opiate contract- since couldn't get here to appt today in office, will be called in next 2 weeks to come in to do so.  ? ?2. Will wean MS Contin to 1x/day for the next 9 days- call me in 1 week to see how things going.  ? ?3. Will give him Norco 5/325 mg 1-2 tabs q6 hours prn- #120- so can wean down on pain meds and not go into withdrawal.  ? ?4. If tolerates 1x/day after 1 week of MS contin, might be able ot wean off; or do 1x/day for next 59month- will see how things go.  ? ?5. Con't HEP and driving/walking- don't get rid of cane.  ? ? ?5. F/U in 3 months- on SCI/post op pain and osteomyelitis.  ? ? ?I spent a total of 31   minutes on total care today- >50% coordination of care- due to  prolonged discussion about pain meds options- and surgery- and how ot wean- call me in 1 week to discuss further results.  ? ?

## 2022-01-04 ENCOUNTER — Telehealth: Payer: Self-pay

## 2022-01-04 NOTE — Telephone Encounter (Signed)
Hydrocodone/APAP approved 01/01/2022-07/04/2022 ?

## 2022-01-06 ENCOUNTER — Ambulatory Visit (INDEPENDENT_AMBULATORY_CARE_PROVIDER_SITE_OTHER): Payer: 59 | Admitting: Endocrinology

## 2022-01-06 ENCOUNTER — Other Ambulatory Visit: Payer: Self-pay

## 2022-01-06 VITALS — BP 116/70 | HR 70 | Ht 71.0 in | Wt 183.2 lb

## 2022-01-06 DIAGNOSIS — E1142 Type 2 diabetes mellitus with diabetic polyneuropathy: Secondary | ICD-10-CM | POA: Diagnosis not present

## 2022-01-06 MED ORDER — HUMULIN 70/30 (70-30) 100 UNIT/ML ~~LOC~~ SUSP: 15.0000 [IU] | Freq: Every day | SUBCUTANEOUS | 3 refills | Status: DC

## 2022-01-06 NOTE — Patient Instructions (Addendum)
I have sent a prescription to your pharmacy, to increase the insulin to 15 units with breakfast.  ?check your blood sugar once a day.  vary the time of day when you check, between before the 3 meals, and at bedtime.  also check if you have symptoms of your blood sugar being too high or too low.  please keep a record of the readings and bring it to your next appointment here (or you can bring the meter itself).  You can write it on any piece of paper.  please call us sooner if your blood sugar goes below 70, or if most of your readings are over 200.   ?Please come back for a follow-up appointment in 1 month.   ?

## 2022-01-06 NOTE — Telephone Encounter (Signed)
APPROVED 01/01/22-07/04/22. ?

## 2022-01-06 NOTE — Progress Notes (Signed)
? ?Subjective:  ? ? Patient ID: Jose [name], male    DOB: 07-Dec-1975, 46 y.o.   MRN: [id number] ? ?HPI ?Pt returns for f/u of diabetes mellitus: ?DM type: Insulin-requiring type 2 ?Dx'ed: insulin since 2023.     ?Complications: stage 3 CRI and PN.   ?Therapy: insulin since 2022.   ?DKA: never ?Severe hypoglycemia: never.  ?Pancreatitis: never ?Pancreatic imaging: never.   ?SDOH: none ?Other: he declines multiple daily injections; he did not tolerate Trulicity (nausea and tremor).   ?Interval history: insulin has been increased to 12 units qam. He does not know which insulin he takes.   I reviewed continuous glucose monitor data.  Glucose varies from 110-400.  It is in general highest at 10PM, and lowest at 6AM-9AM.  It decreases overnight.  It steadily increases 5Pm-10PM ?Past Medical History:  ?Diagnosis Date  ? AKI (acute kidney injury) ([place])   ? GAD (generalized anxiety disorder)   ? Hyperlipidemia   ? Macular degeneration, bilateral   ? Retinopathy   ? Type II diabetes mellitus with complication, uncontrolled   ? retinopathy, neuropathy, microalbuminuria  ? ? ?Past Surgical History:  ?Procedure Laterality Date  ? APPENDECTOMY    ? BIOPSY  08/10/2021  ? Procedure: BIOPSY;  Surgeon: Otis Brace, MD;  Location: [place] [facility];  Service: Gastroenterology;;  ? BUBBLE STUDY  07/29/2021  ? Procedure: BUBBLE STUDY;  Surgeon: Jerline Pain, MD;  Location: [facility] ENDOSCOPY;  Service: Cardiovascular;;  ? ESOPHAGOGASTRODUODENOSCOPY (EGD) WITH PROPOFOL N/A 08/10/2021  ? Procedure: ESOPHAGOGASTRODUODENOSCOPY (EGD) WITH PROPOFOL;  Surgeon: Otis Brace, MD;  Location: [facility];  Service: Gastroenterology;  Laterality: N/A;  ? ESOPHAGOGASTRODUODENOSCOPY (EGD) WITH PROPOFOL N/A 08/24/2021  ? Procedure: ESOPHAGOGASTRODUODENOSCOPY (EGD) WITH PROPOFOL;  Surgeon: Otis Brace, MD;  Location: [facility] ENDOSCOPY;  Service: Gastroenterology;  Laterality: N/A;  ? HERNIA REPAIR    ? IR FLUORO GUIDED NEEDLE PLC  ASPIRATION/INJECTION LOC  07/28/2021  ? LUMBAR LAMINECTOMY/DECOMPRESSION MICRODISCECTOMY N/A 08/07/2021  ? Procedure: THORACIC FOUR - THORACIC FIVE LAMINECTOMY/DECOMPRESSION OF SPINAL CORD, DEBRIDEMENT OF ABSCESS, MICRODISCECTOMY, INTRAOPERATIVE ULTRASOUND;  Surgeon: Consuella Lose, MD;  Location: [place];  Service: Neurosurgery;  Laterality: N/A;  ? TEE WITHOUT CARDIOVERSION N/A 07/29/2021  ? Procedure: TRANSESOPHAGEAL ECHOCARDIOGRAM (TEE);  Surgeon: Jerline Pain, MD;  Location: [facility] ENDOSCOPY;  Service: Cardiovascular;  Laterality: N/A;  ? TRIGGER FINGER RELEASE Right 10/25/2019  ? Procedure: RIGHT INDEX FINGER RELEASE TRIGGER FINGER/A-1 PULLEY;  Surgeon: Daryll Brod, MD;  Location: [place];  Service: Orthopedics;  Laterality: Right;  IV REGIONAL FOREARM BLOCK  ? ? ?Social History  ? ?Socioeconomic History  ? Marital status: Married  ?  Spouse name: Not on file  ? Number of children: 2  ? Years of education: Not on file  ? Highest education level: Bachelor's degree (e.g., BA, AB, BS)  ?Occupational History  ? Occupation: Artist  ?  Employer: [employer]  ?Tobacco Use  ? Smoking status: Former  ?  Packs/day: 1.00  ?  Years: 20.00  ?  Pack years: 20.00  ?  Types: Cigarettes  ?  Quit date: 07/2020  ?  Years since quitting: 1.4  ? Smokeless tobacco: Never  ?Vaping Use  ? Vaping Use: Never used  ?Substance and Sexual Activity  ? Alcohol use: Not Currently  ? Drug use: Never  ? Sexual activity: Yes  ?  Partners: Female  ?  Birth control/protection: None  ?  Comment: with monogamous partner  ?Other Topics Concern  ?  Not on file  ?Social History Narrative  ? Pt has lived 7 of life in [place]. Lives at home with partner, 2 kids, 8 cats, and 1 dog.   ? ?Social Determinants of Health  ? ?Financial Resource Strain: Not on file  ?Food Insecurity: Not on file  ?Transportation Needs: Not on file  ?Physical Activity: Not on file  ?Stress: Not on file  ?Social Connections: Not on  file  ?Intimate Partner Violence: Not on file  ? ? ?Current Outpatient Medications on File Prior to Visit  ?Medication Sig Dispense Refill  ? acetaminophen (TYLENOL) 325 MG tablet 1 capsule as needed    ? albuterol (VENTOLIN HFA) 108 (90 Base) MCG/ACT inhaler Inhale 1 puff into the lungs every 4 (four) hours as needed for wheezing or shortness of breath.    ? ALPRAZolam (XANAX) 0.25 MG tablet Take 1 tablet (0.25 mg total) by mouth 2 (two) times daily as needed for anxiety. 30 tablet 0  ? amLODipine (NORVASC) 5 MG tablet Take 5 mg by mouth daily.    ? atorvastatin (LIPITOR) 40 MG tablet Take 1 tablet by mouth daily.    ? bethanechol (URECHOLINE) 10 MG tablet Take 1 tablet (10 mg total) by mouth 3 (three) times daily. 90 tablet 0  ? bisacodyl (DULCOLAX) 10 MG suppository Place 1 suppository (10 mg total) rectally daily as needed for moderate constipation. 12 suppository 0  ? cephALEXin (KEFLEX) 500 MG capsule Take 500 mg by mouth 3 (three) times daily.    ? cholecalciferol (VITAMIN D3) 25 MCG (1000 UNIT) tablet 1 tablet    ? collagenase (SANTYL) ointment Apply topically daily. 30 g 0  ? Continuous Blood Gluc Sensor (FREESTYLE LIBRE 2 SENSOR) MISC 1 Device by Does not apply route every 14 (fourteen) days. 6 each 3  ? furosemide (LASIX) 20 MG tablet Take 20 mg by mouth daily.    ? gabapentin (NEURONTIN) 100 MG capsule Take 2 capsules (200 mg total) by mouth 2 (two) times daily. 120 capsule 0  ? HYDROcodone-acetaminophen (NORCO) 5-325 MG tablet Take 1-2 tablets by mouth every 6 (six) hours as needed for severe pain. 120 tablet 0  ? hydrOXYzine (ATARAX/VISTARIL) 10 MG tablet Take 1 tablet (10 mg total) by mouth 3 (three) times daily as needed for anxiety. 60 tablet 0  ? Insulin Pen Needle (PENTIPS) 32G X 4 MM MISC use as directed 100 each 1  ? methocarbamol (ROBAXIN) 500 MG tablet Take 1 tablet (500 mg total) by mouth every 6 (six) hours as needed for muscle spasms. 60 tablet 0  ? metoprolol tartrate (LOPRESSOR) 50 MG  tablet Take 1 tablet (50 mg total) by mouth 2 (two) times daily. 60 tablet 0  ? mirtazapine (REMERON) 15 MG tablet Take 1 tablet (15 mg total) by mouth at bedtime. 30 tablet 0  ? morphine (MS CONTIN) 15 MG 12 hr tablet Take 1 tablet (15 mg total) by mouth every 12 (twelve) hours. Do Not Fill Before 10/25/2021 60 tablet 0  ? Multiple Vitamin (MULTIVITAMIN ADULT PO) Take 1 tablet by mouth daily.    ? naloxone (NARCAN) nasal spray 4 mg/0.1 mL Use as needed for signs of overdose. 2 each 1  ? nutrition supplement, JUVEN, (JUVEN) PACK Take 1 packet by mouth 2 (two) times daily between meals. 60 packet 1  ? ondansetron (ZOFRAN-ODT) 4 MG disintegrating tablet Take 4 mg by mouth daily.    ? oxyCODONE-acetaminophen (PERCOCET) 10-325 MG tablet Take 1 tablet by mouth every 6 (six)  hours as needed for pain. Can fill 09/28/21 120 tablet 0  ? pantoprazole (PROTONIX) 40 MG tablet Take 1 tablet (40 mg total) by mouth 2 (two) times daily. 60 tablet 0  ? PARoxetine (PAXIL) 20 MG tablet Take 1 tablet (20 mg total) by mouth at bedtime. 30 tablet 0  ? polyethylene glycol (MIRALAX / GLYCOLAX) 17 g packet Take 17 g by mouth daily as needed for moderate constipation, mild constipation or severe constipation. 14 each 0  ? senna-docusate (SENOKOT-S) 8.6-50 MG tablet Take 2 tablets by mouth at bedtime as needed for mild constipation. 60 tablet 0  ? sucralfate (CARAFATE) 1 GM/10ML suspension Take 10 mLs (1 g total) by mouth 4 (four) times daily -  with meals and at bedtime. 420 mL 0  ? tamsulosin (FLOMAX) 0.4 MG CAPS capsule Take 1 capsule (0.4 mg total) by mouth daily after supper. 30 capsule 0  ? tobramycin (TOBREX) 0.3 % ophthalmic solution SMARTSIG:In Eye(s)    ? ?No current facility-administered medications on file prior to visit.  ? ? ?Allergies  ?Allergen Reactions  ? Cranberry Itching  ? Hm Lidocaine Patch [Lidocaine] Dermatitis  ?  Blisters skin   ? Melatonin Other (See Comments)  ?  nightmares  ? ? ?Family History  ?Problem Relation  Age of Onset  ? Diabetes Mother   ? Hyperlipidemia Mother   ? Stroke Mother   ? Diabetes Father   ? Hyperlipidemia Brother   ? Stroke Brother   ? ADD / ADHD Brother   ? ADD / ADHD Son   ? ? ?BP 116/70 (BP Location: Left

## 2022-01-13 ENCOUNTER — Telehealth: Payer: Self-pay | Admitting: Physical Medicine and Rehabilitation

## 2022-01-13 ENCOUNTER — Other Ambulatory Visit: Payer: Self-pay | Admitting: Physical Medicine and Rehabilitation

## 2022-01-13 MED ORDER — MORPHINE SULFATE ER 15 MG PO TBCR: 15.0000 mg | EXTENDED_RELEASE_TABLET | Freq: Every day | ORAL | 0 refills | Status: DC

## 2022-01-13 MED ORDER — HYDROXYZINE HCL 25 MG PO TABS: 25.0000 mg | ORAL_TABLET | Freq: Three times a day (TID) | ORAL | 1 refills | Status: DC | PRN

## 2022-01-13 NOTE — Telephone Encounter (Signed)
Sent message- sent in hydroxyzine 25 mg TID prn for anxiety related to withdrawal.  ? ?Also, sent in another 1 month for MS Contin 30 tabs- daily-  ?Will stop after that.  ? ?Sent a my chart message response to wife, since couldn't get either wife or pt on phone- called twice.  ?

## 2022-01-26 ENCOUNTER — Other Ambulatory Visit (HOSPITAL_COMMUNITY): Payer: Self-pay

## 2022-01-27 ENCOUNTER — Encounter: Payer: Self-pay | Admitting: Endocrinology

## 2022-02-09 ENCOUNTER — Ambulatory Visit (INDEPENDENT_AMBULATORY_CARE_PROVIDER_SITE_OTHER): Payer: 59 | Admitting: Infectious Diseases

## 2022-02-09 ENCOUNTER — Encounter: Payer: Self-pay | Admitting: Infectious Diseases

## 2022-02-09 ENCOUNTER — Other Ambulatory Visit: Payer: Self-pay

## 2022-02-09 VITALS — BP 180/83 | HR 88 | Temp 97.8°F | Wt 185.0 lb

## 2022-02-09 DIAGNOSIS — Z5181 Encounter for therapeutic drug level monitoring: Secondary | ICD-10-CM

## 2022-02-09 DIAGNOSIS — M4620 Osteomyelitis of vertebra, site unspecified: Secondary | ICD-10-CM

## 2022-02-09 DIAGNOSIS — R030 Elevated blood-pressure reading, without diagnosis of hypertension: Secondary | ICD-10-CM

## 2022-02-09 NOTE — Progress Notes (Addendum)
? ?  ? ?Patient Active Problem List  ? Diagnosis Date Noted  ? Vertebral osteomyelitis ([place]) 12/19/2021  ? Chronic kidney disease 11/10/2021  ? Pathologic fracture of thoracic vertebrae, sequela 11/10/2021  ? Sacral wound, subsequent encounter 10/29/2021  ? Medication monitoring encounter 09/30/2021  ? GIB (gastrointestinal bleeding) 09/21/2021  ? Duodenal ulcer 09/21/2021  ? Esophagitis 09/21/2021  ? Decubitus ulcer of sacral region, unstageable ([place]) 09/21/2021  ? Osteomyelitis of sacrum ([place]) 09/21/2021  ? Hyponatremia   ? AKI (acute kidney injury) ([place])   ? Acute blood loss anemia   ? Labile blood glucose   ? Thoracic spondylosis with myelopathy 09/02/2021  ? Debility 08/30/2021  ? Osteomyelitis of thoracic region [facility]) 08/06/2021  ? Essential hypertension 08/06/2021  ? CKD (chronic kidney disease) stage 3, GFR 30-59 ml/min ([place]) 08/06/2021  ? Pleural effusion, bilateral 08/06/2021  ? Diabetic ulcer of left great toe ([place])   ? MSSA bacteremia 07/23/2021  ? Constipation 07/23/2021  ? Lytic bone lesions on xray 07/22/2021  ? Anemia 05/19/2021  ? Nausea and vomiting 05/19/2021  ? Elevated blood pressure reading 05/19/2021  ? Bilateral carpal tunnel syndrome 01/23/2020  ? Numbness 12/12/2019  ? Microalbuminuria due to type 2 diabetes mellitus ([place]) 07/20/2019  ? GAD (generalized anxiety disorder) 06/18/2019  ? Adult ADHD 06/18/2019  ? Trigger middle finger of left hand 06/11/2019  ? Diabetic retinopathy ([place]) 07/13/2018  ? Type 2 diabetes mellitus with diabetic polyneuropathy, without long-term current use of insulin ([place]) 05/17/2018  ? Tobacco use disorder 05/17/2018  ? Hyperlipidemia 05/17/2018  ? ? ?Patient's Medications  ?New Prescriptions  ? No medications on file  ?Previous Medications  ? ACETAMINOPHEN (TYLENOL) 325 MG TABLET    1 capsule as needed  ? ALBUTEROL (VENTOLIN HFA) 108 (90 BASE) MCG/ACT INHALER    Inhale 1 puff into the lungs every 4 (four) hours as needed for wheezing or shortness of breath.  ?  ALPRAZOLAM (XANAX) 0.25 MG TABLET    Take 1 tablet (0.25 mg total) by mouth 2 (two) times daily as needed for anxiety.  ? AMLODIPINE (NORVASC) 5 MG TABLET    Take 5 mg by mouth daily.  ? ATORVASTATIN (LIPITOR) 40 MG TABLET    Take 1 tablet by mouth daily.  ? BETHANECHOL (URECHOLINE) 10 MG TABLET    Take 1 tablet (10 mg total) by mouth 3 (three) times daily.  ? BISACODYL (DULCOLAX) 10 MG SUPPOSITORY    Place 1 suppository (10 mg total) rectally daily as needed for moderate constipation.  ? CEPHALEXIN (KEFLEX) 500 MG CAPSULE    Take 500 mg by mouth 3 (three) times daily.  ? CHOLECALCIFEROL (VITAMIN D3) 25 MCG (1000 UNIT) TABLET    1 tablet  ? COLLAGENASE (SANTYL) OINTMENT    Apply topically daily.  ? CONTINUOUS BLOOD GLUC SENSOR (FREESTYLE LIBRE 2 SENSOR) MISC    1 Device by Does not apply route every 14 (fourteen) days.  ? FUROSEMIDE (LASIX) 20 MG TABLET    Take 20 mg by mouth daily.  ? GABAPENTIN (NEURONTIN) 100 MG CAPSULE    Take 2 capsules (200 mg total) by mouth 2 (two) times daily.  ? HYDROCODONE-ACETAMINOPHEN (NORCO) 5-325 MG TABLET    Take 1-2 tablets by mouth every 6 (six) hours as needed for severe pain.  ? HYDROXYZINE (ATARAX) 25 MG TABLET    Take 1 tablet (25 mg total) by mouth 3 (three) times daily as needed for anxiety or nausea.  ? INSULIN NPH-REGULAR HUMAN (HUMULIN 70/30) (  70-30) 100 UNIT/ML INJECTION    Inject 15 Units into the skin daily with breakfast.  ? INSULIN PEN NEEDLE (PENTIPS) 32G X 4 MM MISC    use as directed  ? METHOCARBAMOL (ROBAXIN) 500 MG TABLET    Take 1 tablet (500 mg total) by mouth every 6 (six) hours as needed for muscle spasms.  ? METOPROLOL TARTRATE (LOPRESSOR) 50 MG TABLET    Take 1 tablet (50 mg total) by mouth 2 (two) times daily.  ? MIRTAZAPINE (REMERON) 15 MG TABLET    Take 1 tablet (15 mg total) by mouth at bedtime.  ? MORPHINE (MS CONTIN) 15 MG 12 HR TABLET    Take 1 tablet (15 mg total) by mouth daily. Do Not Fill Before 10/25/2021  ? MULTIPLE VITAMIN (MULTIVITAMIN ADULT  PO)    Take 1 tablet by mouth daily.  ? NALOXONE (NARCAN) NASAL SPRAY 4 MG/0.1 ML    Use as needed for signs of overdose.  ? NUTRITION SUPPLEMENT, JUVEN, (JUVEN) PACK    Take 1 packet by mouth 2 (two) times daily between meals.  ? ONDANSETRON (ZOFRAN-ODT) 4 MG DISINTEGRATING TABLET    Take 4 mg by mouth daily.  ? OXYCODONE-ACETAMINOPHEN (PERCOCET) 10-325 MG TABLET    Take 1 tablet by mouth every 6 (six) hours as needed for pain. Can fill 09/28/21  ? PANTOPRAZOLE (PROTONIX) 40 MG TABLET    Take 1 tablet (40 mg total) by mouth 2 (two) times daily.  ? PAROXETINE (PAXIL) 20 MG TABLET    Take 1 tablet (20 mg total) by mouth at bedtime.  ? POLYETHYLENE GLYCOL (MIRALAX / GLYCOLAX) 17 G PACKET    Take 17 g by mouth daily as needed for moderate constipation, mild constipation or severe constipation.  ? SENNA-DOCUSATE (SENOKOT-S) 8.6-50 MG TABLET    Take 2 tablets by mouth at bedtime as needed for mild constipation.  ? SUCRALFATE (CARAFATE) 1 GM/10ML SUSPENSION    Take 10 mLs (1 g total) by mouth 4 (four) times daily -  with meals and at bedtime.  ? TAMSULOSIN (FLOMAX) 0.4 MG CAPS CAPSULE    Take 1 capsule (0.4 mg total) by mouth daily after supper.  ? TOBRAMYCIN (TOBREX) 0.3 % OPHTHALMIC SOLUTION    SMARTSIG:In Eye(s)  ?Modified Medications  ? No medications on file  ?Discontinued Medications  ? No medications on file  ? ? ?Subjective: ?Here for follow up for thoracic discitis and osteomyelitis. He has been taking cephalexin twice a day. Denies missing doses. Denies nausea, vomiting and diarrhea. Back pain has significantly improved He occasionally feels back pain when coming out of the car. Takes percocet as needed but takes one in the morning and one in between lunch and dinner. He is able to walk independently with the help of cane. He has difficulty going up and down the stairs. He feels getting better and stronger each day. BP was high 867E systolic but says he has not taken his HTN pills. In the morning. He will be  following up with Neurosurgery in couple of weeks. Denies any fevers, chills and sweats. Denies nausea, vomiting and diarrhea. He follows PMR for pain management and PCP for DM.  ? ?Review of Systems: ?ROS all systems reviewed with pertinent positives and negatives as listed above ? ?Past Medical History:  ?Diagnosis Date  ? AKI (acute kidney injury) ([place])   ? GAD (generalized anxiety disorder)   ? Hyperlipidemia   ? Macular degeneration, bilateral   ? Retinopathy   ?  Type II diabetes mellitus with complication, uncontrolled   ? retinopathy, neuropathy, microalbuminuria  ? ? ?Social History  ? ?Tobacco Use  ? Smoking status: Former  ?  Packs/day: 1.00  ?  Years: 20.00  ?  Pack years: 20.00  ?  Types: Cigarettes  ?  Quit date: 07/2020  ?  Years since quitting: 1.5  ? Smokeless tobacco: Never  ?Vaping Use  ? Vaping Use: Never used  ?Substance Use Topics  ? Alcohol use: Not Currently  ? Drug use: Never  ? ? ?Family History  ?Problem Relation Age of Onset  ? Diabetes Mother   ? Hyperlipidemia Mother   ? Stroke Mother   ? Diabetes Father   ? Hyperlipidemia Brother   ? Stroke Brother   ? ADD / ADHD Brother   ? ADD / ADHD Son   ? ? ?Allergies  ?Allergen Reactions  ? Cranberry Itching  ? Hm Lidocaine Patch [Lidocaine] Dermatitis  ?  Blisters skin   ? Melatonin Other (See Comments)  ?  nightmares  ? ? ?Health Maintenance  ?Topic Date Due  ? COLONOSCOPY (Pts 45-76yrs Insurance coverage will need to be confirmed)  Never done  ? COVID-19 Vaccine (3 - Pfizer risk series) 03/20/2020  ? URINE MICROALBUMIN  04/11/2020  ? TETANUS/TDAP  10/25/2020  ? OPHTHALMOLOGY EXAM  11/20/2020  ? INFLUENZA VACCINE  05/25/2022  ? HEMOGLOBIN A1C  06/08/2022  ? FOOT EXAM  06/11/2022  ? Hepatitis C Screening  Completed  ? HIV Screening  Completed  ? HPV VACCINES  Aged Out  ? ? ?Objective: ? ?Vitals:  ? 02/09/22 0945  ?BP: (!) 180/83  ?Pulse: 88  ?Temp: 97.8 ?F (36.6 ?C)  ?TempSrc: Oral  ?SpO2: 90%  ?Weight: 185 lb (83.9 kg)  ? ?Body mass index is  25.8 kg/m?. ? ?Physical Exam ?Constitutional:   ?   Appearance: Normal appearance.  ?HENT:  ?   Head: Normocephalic and atraumatic.   ?   Mouth: Mucous membranes are moist.  ?Eyes: ?   Conjunctiva/sclera: Conj

## 2022-02-18 ENCOUNTER — Ambulatory Visit (INDEPENDENT_AMBULATORY_CARE_PROVIDER_SITE_OTHER): Payer: 59 | Admitting: Endocrinology

## 2022-02-18 ENCOUNTER — Encounter: Payer: Self-pay | Admitting: Endocrinology

## 2022-02-18 VITALS — BP 144/98 | HR 70 | Ht 71.0 in | Wt 186.2 lb

## 2022-02-18 DIAGNOSIS — E1142 Type 2 diabetes mellitus with diabetic polyneuropathy: Secondary | ICD-10-CM

## 2022-02-18 LAB — POCT GLYCOSYLATED HEMOGLOBIN (HGB A1C): Hemoglobin A1C: 11.4 % — AB (ref 4.0–5.6)

## 2022-02-18 MED ORDER — HUMULIN 70/30 KWIKPEN (70-30) 100 UNIT/ML ~~LOC~~ SUPN: 20.0000 [IU] | PEN_INJECTOR | Freq: Every day | SUBCUTANEOUS | 1 refills | Status: DC

## 2022-02-18 NOTE — Progress Notes (Signed)
? ?Subjective:  ? ? Patient ID: Jose [name], male    DOB: 03/28/1976, 46 y.o.   MRN: [id number] ? ?HPI ?Pt returns for f/u of diabetes mellitus: ?DM type: Insulin-requiring type 2 ?Dx'ed: insulin since 2023.     ?Complications: stage 3 CRI and PN.   ?Therapy: insulin since 2022.   ?DKA: never ?Severe hypoglycemia: never.  ?Pancreatitis: never ?Pancreatic imaging: never.   ?SDOH: wife helps him with insulin injections.   ?Other: he declines multiple daily injections; he did not tolerate Trulicity (nausea and tremor).   ?Interval history: I reviewed continuous glucose monitor download, but data are minimal.  Glucose is approx 350.  Pt says he misses 3-4 days of insulin last week, due to not taking the cap off the insulin pen.  His wife has since advised him how to use pen.  Pt says he takes 70/30, not NPH ?Past Medical History:  ?Diagnosis Date  ? AKI (acute kidney injury) ([place])   ? GAD (generalized anxiety disorder)   ? Hyperlipidemia   ? Macular degeneration, bilateral   ? Retinopathy   ? Type II diabetes mellitus with complication, uncontrolled   ? retinopathy, neuropathy, microalbuminuria  ? ? ?Past Surgical History:  ?Procedure Laterality Date  ? APPENDECTOMY    ? BIOPSY  08/10/2021  ? Procedure: BIOPSY;  Surgeon: Otis Brace, MD;  Location: [facility];  Service: Gastroenterology;;  ? BUBBLE STUDY  07/29/2021  ? Procedure: BUBBLE STUDY;  Surgeon: Jerline Pain, MD;  Location: [facility] ENDOSCOPY;  Service: Cardiovascular;;  ? ESOPHAGOGASTRODUODENOSCOPY (EGD) WITH PROPOFOL N/A 08/10/2021  ? Procedure: ESOPHAGOGASTRODUODENOSCOPY (EGD) WITH PROPOFOL;  Surgeon: Otis Brace, MD;  Location: [facility];  Service: Gastroenterology;  Laterality: N/A;  ? ESOPHAGOGASTRODUODENOSCOPY (EGD) WITH PROPOFOL N/A 08/24/2021  ? Procedure: ESOPHAGOGASTRODUODENOSCOPY (EGD) WITH PROPOFOL;  Surgeon: Otis Brace, MD;  Location: [facility]OSCOPY;  Service: Gastroenterology;  Laterality: N/A;  ? HERNIA REPAIR    ? IR FLUORO  GUIDED NEEDLE PLC ASPIRATION/INJECTION LOC  07/28/2021  ? LUMBAR LAMINECTOMY/DECOMPRESSION MICRODISCECTOMY N/A 08/07/2021  ? Procedure: THORACIC FOUR - THORACIC FIVE LAMINECTOMY/DECOMPRESSION OF SPINAL CORD, DEBRIDEMENT OF ABSCESS, MICRODISCECTOMY, INTRAOPERATIVE ULTRASOUND;  Surgeon: Consuella Lose, MD;  Location: [place];  Service: Neurosurgery;  Laterality: N/A;  ? TEE WITHOUT CARDIOVERSION N/A 07/29/2021  ? Procedure: TRANSESOPHAGEAL ECHOCARDIOGRAM (TEE);  Surgeon: Jerline Pain, MD;  Location: [facility] ENDOSCOPY;  Service: Cardiovascular;  Laterality: N/A;  ? TRIGGER FINGER RELEASE Right 10/25/2019  ? Procedure: RIGHT INDEX FINGER RELEASE TRIGGER FINGER/A-1 PULLEY;  Surgeon: Daryll Brod, MD;  Location: [place];  Service: Orthopedics;  Laterality: Right;  IV REGIONAL FOREARM BLOCK  ? ? ?Social History  ? ?Socioeconomic History  ? Marital status: Married  ?  Spouse name: Not on file  ? Number of children: 2  ? Years of education: Not on file  ? Highest education level: Bachelor's degree (e.g., BA, AB, BS)  ?Occupational History  ? Occupation: Artist  ?  Employer: [employer]  ?Tobacco Use  ? Smoking status: Former  ?  Packs/day: 1.00  ?  Years: 20.00  ?  Pack years: 20.00  ?  Types: Cigarettes  ?  Quit date: 07/2020  ?  Years since quitting: 1.5  ? Smokeless tobacco: Never  ?Vaping Use  ? Vaping Use: Never used  ?Substance and Sexual Activity  ? Alcohol use: Not Currently  ? Drug use: Never  ? Sexual activity: Yes  ?  Partners: Female  ?  Birth control/protection: None  ?  Comment: with monogamous partner  ?Other Topics Concern  ? Not on file  ?Social History Narrative  ? Pt has lived 37 of life in [place]. Lives at home with partner, 2 kids, 8 cats, and 1 dog.   ? ?Social Determinants of Health  ? ?Financial Resource Strain: Not on file  ?Food Insecurity: Not on file  ?Transportation Needs: Not on file  ?Physical Activity: Not on file  ?Stress: Not on file  ?Social  Connections: Not on file  ?Intimate Partner Violence: Not on file  ? ? ?Current Outpatient Medications on File Prior to Visit  ?Medication Sig Dispense Refill  ? acetaminophen (TYLENOL) 325 MG tablet 1 capsule as needed    ? albuterol (VENTOLIN HFA) 108 (90 Base) MCG/ACT inhaler Inhale 1 puff into the lungs every 4 (four) hours as needed for wheezing or shortness of breath.    ? ALPRAZolam (XANAX) 0.25 MG tablet Take 1 tablet (0.25 mg total) by mouth 2 (two) times daily as needed for anxiety. 30 tablet 0  ? amLODipine (NORVASC) 5 MG tablet Take 5 mg by mouth daily.    ? atorvastatin (LIPITOR) 40 MG tablet Take 1 tablet by mouth daily.    ? bethanechol (URECHOLINE) 10 MG tablet Take 1 tablet (10 mg total) by mouth 3 (three) times daily. 90 tablet 0  ? bisacodyl (DULCOLAX) 10 MG suppository Place 1 suppository (10 mg total) rectally daily as needed for moderate constipation. 12 suppository 0  ? cephALEXin (KEFLEX) 500 MG capsule Take 500 mg by mouth 3 (three) times daily.    ? cholecalciferol (VITAMIN D3) 25 MCG (1000 UNIT) tablet 1 tablet    ? collagenase (SANTYL) ointment Apply topically daily. 30 g 0  ? Continuous Blood Gluc Sensor (FREESTYLE LIBRE 2 SENSOR) MISC 1 Device by Does not apply route every 14 (fourteen) days. 6 each 3  ? furosemide (LASIX) 20 MG tablet Take 20 mg by mouth daily.    ? gabapentin (NEURONTIN) 100 MG capsule Take 2 capsules (200 mg total) by mouth 2 (two) times daily. 120 capsule 0  ? HYDROcodone-acetaminophen (NORCO) 5-325 MG tablet Take 1-2 tablets by mouth every 6 (six) hours as needed for severe pain. 120 tablet 0  ? hydrOXYzine (ATARAX) 25 MG tablet Take 1 tablet (25 mg total) by mouth 3 (three) times daily as needed for anxiety or nausea. 90 tablet 1  ? Insulin Pen Needle (PENTIPS) 32G X 4 MM MISC use as directed 100 each 1  ? methocarbamol (ROBAXIN) 500 MG tablet Take 1 tablet (500 mg total) by mouth every 6 (six) hours as needed for muscle spasms. 60 tablet 0  ? metoprolol tartrate  (LOPRESSOR) 50 MG tablet Take 1 tablet (50 mg total) by mouth 2 (two) times daily. 60 tablet 0  ? mirtazapine (REMERON) 15 MG tablet Take 1 tablet (15 mg total) by mouth at bedtime. 30 tablet 0  ? morphine (MS CONTIN) 15 MG 12 hr tablet Take 1 tablet (15 mg total) by mouth daily. Do Not Fill Before 10/25/2021 30 tablet 0  ? Multiple Vitamin (MULTIVITAMIN ADULT PO) Take 1 tablet by mouth daily.    ? naloxone (NARCAN) nasal spray 4 mg/0.1 mL Use as needed for signs of overdose. 2 each 1  ? nutrition supplement, JUVEN, (JUVEN) PACK Take 1 packet by mouth 2 (two) times daily between meals. 60 packet 1  ? ondansetron (ZOFRAN-ODT) 4 MG disintegrating tablet Take 4 mg by mouth daily.    ? oxyCODONE-acetaminophen (PERCOCET) 10-325 MG  tablet Take 1 tablet by mouth every 6 (six) hours as needed for pain. Can fill 09/28/21 120 tablet 0  ? pantoprazole (PROTONIX) 40 MG tablet Take 1 tablet (40 mg total) by mouth 2 (two) times daily. 60 tablet 0  ? PARoxetine (PAXIL) 20 MG tablet Take 1 tablet (20 mg total) by mouth at bedtime. 30 tablet 0  ? polyethylene glycol (MIRALAX / GLYCOLAX) 17 g packet Take 17 g by mouth daily as needed for moderate constipation, mild constipation or severe constipation. 14 each 0  ? senna-docusate (SENOKOT-S) 8.6-50 MG tablet Take 2 tablets by mouth at bedtime as needed for mild constipation. 60 tablet 0  ? sucralfate (CARAFATE) 1 GM/10ML suspension Take 10 mLs (1 g total) by mouth 4 (four) times daily -  with meals and at bedtime. 420 mL 0  ? tamsulosin (FLOMAX) 0.4 MG CAPS capsule Take 1 capsule (0.4 mg total) by mouth daily after supper. 30 capsule 0  ? tobramycin (TOBREX) 0.3 % ophthalmic solution SMARTSIG:In Eye(s)    ? ?No current facility-administered medications on file prior to visit.  ? ? ?Allergies  ?Allergen Reactions  ? Cranberry Itching  ? Hm Lidocaine Patch [Lidocaine] Dermatitis  ?  Blisters skin   ? Melatonin Other (See Comments)  ?  nightmares  ? ? ?Family History  ?Problem Relation  Age of Onset  ? Diabetes Mother   ? Hyperlipidemia Mother   ? Stroke Mother   ? Diabetes Father   ? Hyperlipidemia Brother   ? Stroke Brother   ? ADD / ADHD Brother   ? ADD / ADHD Son   ? ? ?BP (!) 144/9

## 2022-02-18 NOTE — Patient Instructions (Addendum)
I have sent a prescription to your pharmacy, to increase the insulin to 20 units with breakfast.  ?check your blood sugar once a day.  vary the time of day when you check, between before the 3 meals, and at bedtime.  also check if you have symptoms of your blood sugar being too high or too low.  please keep a record of the readings and bring it to your next appointment here (or you can bring the meter itself).  You can write it on any piece of paper.  please call us sooner if your blood sugar goes below 70, or if most of your readings are over 200.   ?You should have an endocrinology follow-up appointment in 2 months.   ?

## 2022-03-30 ENCOUNTER — Other Ambulatory Visit (HOSPITAL_COMMUNITY): Payer: Self-pay

## 2022-03-31 ENCOUNTER — Encounter: Payer: Self-pay | Admitting: Endocrinology

## 2022-04-23 ENCOUNTER — Other Ambulatory Visit (INDEPENDENT_AMBULATORY_CARE_PROVIDER_SITE_OTHER): Payer: 59

## 2022-04-23 ENCOUNTER — Other Ambulatory Visit: Payer: Self-pay | Admitting: Endocrinology

## 2022-04-23 DIAGNOSIS — E782 Mixed hyperlipidemia: Secondary | ICD-10-CM

## 2022-04-23 DIAGNOSIS — E1142 Type 2 diabetes mellitus with diabetic polyneuropathy: Secondary | ICD-10-CM

## 2022-04-23 LAB — GLUCOSE, RANDOM: Glucose, Bld: 72 mg/dL (ref 70–99)

## 2022-04-23 LAB — LDL CHOLESTEROL, DIRECT: Direct LDL: 115 mg/dL

## 2022-04-24 LAB — FRUCTOSAMINE: Fructosamine: 387 umol/L — ABNORMAL HIGH (ref 0–285)

## 2022-04-29 ENCOUNTER — Ambulatory Visit (INDEPENDENT_AMBULATORY_CARE_PROVIDER_SITE_OTHER): Payer: 59 | Admitting: Endocrinology

## 2022-04-29 ENCOUNTER — Encounter: Payer: Self-pay | Admitting: Endocrinology

## 2022-04-29 VITALS — BP 170/86 | HR 87 | Ht 71.0 in | Wt 211.2 lb

## 2022-04-29 DIAGNOSIS — Z794 Long term (current) use of insulin: Secondary | ICD-10-CM | POA: Diagnosis not present

## 2022-04-29 DIAGNOSIS — E1165 Type 2 diabetes mellitus with hyperglycemia: Secondary | ICD-10-CM

## 2022-04-29 DIAGNOSIS — I10 Essential (primary) hypertension: Secondary | ICD-10-CM | POA: Diagnosis not present

## 2022-04-29 MED ORDER — TOUJEO SOLOSTAR 300 UNIT/ML ~~LOC~~ SOPN: 20.0000 [IU] | PEN_INJECTOR | Freq: Every day | SUBCUTANEOUS | 2 refills | Status: DC

## 2022-04-29 MED ORDER — INSULIN LISPRO (1 UNIT DIAL) 100 UNIT/ML (KWIKPEN): PEN_INJECTOR | SUBCUTANEOUS | 1 refills | Status: DC

## 2022-04-29 MED ORDER — FREESTYLE LIBRE 3 SENSOR MISC: 1.0000 | 2 refills | Status: DC

## 2022-04-29 NOTE — Patient Instructions (Signed)
Check blood sugars on waking up   Also check blood sugars about 2 hours after meals and do this after different meals by rotation  Recommended blood sugar levels on waking up are 90-130 and about 2 hours after meal is 130-160  Please bring your blood sugar monitor to each visit, thank you  

## 2022-04-29 NOTE — Progress Notes (Signed)
Patient ID: Jose [name], male   DOB: [birth year], 46 y.o.   MRN: [id number]           Reason for Appointment: Type II Diabetes follow-up   History of Present Illness   Diagnosis date: age 20  Previous history:  He was initially put on medications at the time of diagnosis but only in his early 30s  Non-insulin hypoglycemic drugs previously used: Trulicity, Byetta, Amaryl, metformin, Tradjenta, Farxiga and Invokana Insulin was started in 2022 He has previously been taking Levemir and Lantus insulin as well as NovoLog  A1c range in the last few years is:7-14.2  Recent history:     Non-insulin hypoglycemic drugs: None     Insulin regimen: 20 units of 70/30 insulin since 2/23    Side effects from medications: Trulicity caused nausea and tremor  Current self management, blood sugar patterns and problems identified:  A1c is last 11.4 Fructosamine is now 387 His A1c had gone up significantly in April but he thinks he was not doing his insulin injection properly and not clear if it was getting any insulin level Also at that time he was using the freestyle Tea although monitoring with this sporadically, review of his previous data from late April shows average blood sugar of 358 He thinks his blood sugars are relatively lower in the mornings around 120-140 but frequently over 200 after meals later in the day With his not eating lunch on the day of his labs his blood sugar was only 72 Does not report hypoglycemia Because of his intercurrent medical problems last year he had lost a significant amount of weight Also he stopped taking Trulicity in February because of perceived side effects, likely was taking 3 mg weekly He has gained a significant amount of weight the last 2 months or so  Exercise: a little walking now  Diet management: 3 meals     Hypoglycemia:  none    Glucometer: Libre/unknown monitor        Blood Glucose readings from recall:  PRE-MEAL Fasting Lunch Dinner  Bedtime Overall  Glucose range: 140      Mean/median:        POST-MEAL PC Breakfast PC Lunch PC Dinner  Glucose range:  200 170-220  Mean/median:       Dietician visit: Most recent:      Weight control:  Wt Readings from Last 3 Encounters:  04/29/22 211 lb 3.2 oz (95.8 kg)  02/18/22 186 lb 3.2 oz (84.5 kg)  02/09/22 185 lb (83.9 kg)            Diabetes labs:  Lab Results  Component Value Date   HGBA1C 11.4 (A) 02/18/2022   HGBA1C 9.6 (A) 12/09/2021   HGBA1C 7.5 (A) 06/11/2021   Lab Results  Component Value Date   LDLCALC 57 11/22/2019   CREATININE 2.48 (H) 12/18/2021     Allergies as of 04/29/2022       Reactions   Cranberry Itching   Hm Lidocaine Patch [lidocaine] Dermatitis   Blisters skin    Melatonin Other (See Comments)   nightmares        Medication List        Accurate as of April 29, 2022 11:35 AM. If you have any questions, ask your nurse or doctor.          STOP taking these medications    HumuLIN 70/30 KwikPen (70-30) 100 UNIT/ML KwikPen Generic drug: insulin isophane & regular human KwikPen Stopped by: Elayne Snare,  MD       TAKE these medications    acetaminophen 325 MG tablet Commonly known as: TYLENOL 1 capsule as needed   albuterol 108 (90 Base) MCG/ACT inhaler Commonly known as: VENTOLIN HFA Inhale 1 puff into the lungs every 4 (four) hours as needed for wheezing or shortness of breath.   ALPRAZolam 0.25 MG tablet Commonly known as: XANAX Take 1 tablet (0.25 mg total) by mouth 2 (two) times daily as needed for anxiety.   amLODipine 5 MG tablet Commonly known as: NORVASC Take 5 mg by mouth daily.   atorvastatin 40 MG tablet Commonly known as: LIPITOR Take 1 tablet by mouth daily.   bethanechol 10 MG tablet Commonly known as: URECHOLINE Take 1 tablet (10 mg total) by mouth 3 (three) times daily.   bisacodyl 10 MG suppository Commonly known as: DULCOLAX Place 1 suppository (10 mg total) rectally daily as needed for  moderate constipation.   cephALEXin 500 MG capsule Commonly known as: KEFLEX Take 500 mg by mouth 3 (three) times daily.   cholecalciferol 25 MCG (1000 UNIT) tablet Commonly known as: VITAMIN D3 1 tablet   collagenase 250 UNIT/GM ointment Commonly known as: SANTYL Apply topically daily.   FreeStyle Libre 3 Sensor Misc 1 Device by Does not apply route every 14 (fourteen) days. Apply 1 sensor on upper arm every 14 days for continuous glucose monitoring What changed: additional instructions Changed by: Elayne Snare, MD   furosemide 20 MG tablet Commonly known as: LASIX Take 20 mg by mouth daily.   gabapentin 100 MG capsule Commonly known as: NEURONTIN Take 2 capsules (200 mg total) by mouth 2 (two) times daily.   HYDROcodone-acetaminophen 5-325 MG tablet Commonly known as: Norco Take 1-2 tablets by mouth every 6 (six) hours as needed for severe pain.   hydrOXYzine 25 MG tablet Commonly known as: ATARAX Take 1 tablet (25 mg total) by mouth 3 (three) times daily as needed for anxiety or nausea.   insulin lispro 100 UNIT/ML KwikPen Commonly known as: HumaLOG KwikPen 8-14 U ac tid Started by: Elayne Snare, MD   methocarbamol 500 MG tablet Commonly known as: ROBAXIN Take 1 tablet (500 mg total) by mouth every 6 (six) hours as needed for muscle spasms.   metoprolol tartrate 50 MG tablet Commonly known as: LOPRESSOR Take 1 tablet (50 mg total) by mouth 2 (two) times daily.   mirtazapine 15 MG tablet Commonly known as: REMERON Take 1 tablet (15 mg total) by mouth at bedtime.   morphine 15 MG 12 hr tablet Commonly known as: MS CONTIN Take 1 tablet (15 mg total) by mouth daily. Do Not Fill Before 10/25/2021   MULTIVITAMIN ADULT PO Take 1 tablet by mouth daily.   Narcan 4 MG/0.1ML Liqd nasal spray kit Generic drug: naloxone Use as needed for signs of overdose.   nutrition supplement (JUVEN) Pack Take 1 packet by mouth 2 (two) times daily between meals.   ondansetron 4  MG disintegrating tablet Commonly known as: ZOFRAN-ODT Take 4 mg by mouth daily.   oxyCODONE-acetaminophen 10-325 MG tablet Commonly known as: Percocet Take 1 tablet by mouth every 6 (six) hours as needed for pain. Can fill 09/28/21   pantoprazole 40 MG tablet Commonly known as: PROTONIX Take 1 tablet (40 mg total) by mouth 2 (two) times daily.   PARoxetine 20 MG tablet Commonly known as: PAXIL Take 1 tablet (20 mg total) by mouth at bedtime.   Pentips 32G X 4 MM Misc Generic drug: Insulin Pen  Needle use as directed   polyethylene glycol 17 g packet Commonly known as: MIRALAX / GLYCOLAX Take 17 g by mouth daily as needed for moderate constipation, mild constipation or severe constipation.   Senexon-S 8.6-50 MG tablet Generic drug: senna-docusate Take 2 tablets by mouth at bedtime as needed for mild constipation.   sucralfate 1 GM/10ML suspension Commonly known as: CARAFATE Take 10 mLs (1 g total) by mouth 4 (four) times daily -  with meals and at bedtime.   tamsulosin 0.4 MG Caps capsule Commonly known as: FLOMAX Take 1 capsule (0.4 mg total) by mouth daily after supper.   tobramycin 0.3 % ophthalmic solution Commonly known as: TOBREX SMARTSIG:In Eye(s)   Toujeo SoloStar 300 UNIT/ML Solostar Pen Generic drug: insulin glargine (1 Unit Dial) Inject 20 Units into the skin daily. Started by: Elayne Snare, MD        Allergies:  Allergies  Allergen Reactions   Cranberry Itching   Hm Lidocaine Patch [Lidocaine] Dermatitis    Blisters skin    Melatonin Other (See Comments)    nightmares    Past Medical History:  Diagnosis Date   AKI (acute kidney injury) ([place])    GAD (generalized anxiety disorder)    Hyperlipidemia    Macular degeneration, bilateral    Retinopathy    Type II diabetes mellitus with complication, uncontrolled    retinopathy, neuropathy, microalbuminuria    Past Surgical History:  Procedure Laterality Date   APPENDECTOMY     BIOPSY   08/10/2021   Procedure: BIOPSY;  Surgeon: Otis Brace, MD;  Location: [place];  Service: Gastroenterology;;   BUBBLE STUDY  07/29/2021   Procedure: BUBBLE STUDY;  Surgeon: Jerline Pain, MD;  Location: [place] [facility];  Service: Cardiovascular;;   ESOPHAGOGASTRODUODENOSCOPY (EGD) WITH PROPOFOL N/A 08/10/2021   Procedure: ESOPHAGOGASTRODUODENOSCOPY (EGD) WITH PROPOFOL;  Surgeon: Otis Brace, MD;  Location: [place];  Service: Gastroenterology;  Laterality: N/A;   ESOPHAGOGASTRODUODENOSCOPY (EGD) WITH PROPOFOL N/A 08/24/2021   Procedure: ESOPHAGOGASTRODUODENOSCOPY (EGD) WITH PROPOFOL;  Surgeon: Otis Brace, MD;  Location: [place];  Service: Gastroenterology;  Laterality: N/A;   HERNIA REPAIR     IR FLUORO GUIDED NEEDLE PLC ASPIRATION/INJECTION LOC  07/28/2021   LUMBAR LAMINECTOMY/DECOMPRESSION MICRODISCECTOMY N/A 08/07/2021   Procedure: THORACIC FOUR - THORACIC FIVE LAMINECTOMY/DECOMPRESSION OF SPINAL CORD, DEBRIDEMENT OF ABSCESS, MICRODISCECTOMY, INTRAOPERATIVE ULTRASOUND;  Surgeon: Consuella Lose, MD;  Location: [place];  Service: Neurosurgery;  Laterality: N/A;   TEE WITHOUT CARDIOVERSION N/A 07/29/2021   Procedure: TRANSESOPHAGEAL ECHOCARDIOGRAM (TEE);  Surgeon: Jerline Pain, MD;  Location: [facility] ENDOSCOPY;  Service: Cardiovascular;  Laterality: N/A;   TRIGGER FINGER RELEASE Right 10/25/2019   Procedure: RIGHT INDEX FINGER RELEASE TRIGGER FINGER/A-1 PULLEY;  Surgeon: Daryll Brod, MD;  Location: [place];  Service: Orthopedics;  Laterality: Right;  IV REGIONAL FOREARM BLOCK    Family History  Problem Relation Age of Onset   Diabetes Mother    Hyperlipidemia Mother    Stroke Mother    Diabetes Father    Hyperlipidemia Brother    Stroke Brother    ADD / ADHD Brother    ADD / ADHD Son     Social History:  reports that he quit smoking about 21 months ago. His smoking use included cigarettes. He has a 20.00 pack-year smoking history. He has  never used smokeless tobacco. He reports that he does not currently use alcohol. He reports that he does not use drugs.  Review of Systems:  Last diabetic eye exam date  Last foot exam date:  Symptoms of neuropathy:  Hypertension:   Treatment does not include ACE inhibitor or ARB currently  BP Readings from Last 3 Encounters:  04/29/22 (!) 170/86  02/18/22 (!) 144/98  02/09/22 (!) 180/83     Lipid management: Managed with atorvastatin 40 mg daily    Lab Results  Component Value Date   CHOL 116 11/22/2019   CHOL 110 07/20/2019   CHOL 101 12/12/2018   Lab Results  Component Value Date   HDL 42 11/22/2019   HDL 38 (L) 07/20/2019   HDL 47 12/12/2018   Lab Results  Component Value Date   LDLCALC 57 11/22/2019   LDLCALC 46 07/20/2019   LDLCALC 40 12/12/2018   Lab Results  Component Value Date   TRIG 174 (H) 08/17/2021   TRIG 181 (H) 08/14/2021   TRIG 140 05/18/2021   Lab Results  Component Value Date   CHOLHDL 2.8 11/22/2019   CHOLHDL 2.9 07/20/2019   CHOLHDL 2.1 12/12/2018   Lab Results  Component Value Date   LDLDIRECT 115.0 04/23/2022     Examination:   BP (!) 170/86   Pulse 87   Ht 5' 11"  (1.803 m)   Wt 211 lb 3.2 oz (95.8 kg)   SpO2 97%   BMI 29.46 kg/m   Body mass index is 29.46 kg/m.    ASSESSMENT/ PLAN:    Diabetes type 2:   Current regimen: 70/30 insulin once a day  See history of present illness for detailed discussion of current diabetes management, blood sugar patterns and problems identified  A1c is last over 11% Now fructosamine of 347 is still indicating overall higher blood sugars  Blood glucose control is suboptimal because of using only 1 shot of 70/30 insulin daily Considering that he has had marked hyperglycemia in the last few months he needs to be on a separate basal and bolus insulin regimen and likely a GLP-1 drug that he can tolerate Also gaining weight now for various reasons  Recommendations:  Start checking  blood sugars consistently before and after meals If he is able to get coverage for the freestyle libre version 3 he will start using this,  discussed using the information on his phone with the [place] 3 app for continuous display; booklet on using the [employer] sensor and information from this was given to him  Switch to basal bolus insulin regimen with Toujeo and Humalog  Discussed how basal insulin works, timing of injection, dosage, injection sites. Also discussed titration based on fasting blood sugar every 3 days by 2 units and at target of 90-130 for fasting reading. Given a flowsheet with instructions on how to keep a record and adjust the doses He will start with 20 units of TOUJEO once a day at the same time Also discussed in detail the need for mealtime insulin to cover postprandial spikes, action of mealtime insulin, use of the Humalog Kwik pen, timing and action of the rapid acting insulin as well as starting dose and dosage titration to target the two-hour reading of under 180 Most likely will need 4 to 6 units to cover breakfast and lunch and 6 to 8 units at dinnertime based on his meal size and total carbohydrates He will further adjust the doses by 2 units to keep the blood sugars after meals between 130 and 180 He will carry insulin with him if he is eating out Also to prevent further weight gain he will be given a trial of MOUNJARO Discussed  with the patient the action of GIP/GLP-1 drugs, the effects on pancreatic and liver function, effects on brain and stomach with improved satiety, slowing gastric emptying, improving satiety and reducing liver glucose output.  Discussed the effects on promoting weight loss. Explained possible side effects of MOUNJARO, most commonly nausea that usually improves over time; discussed safety information in package insert.  Demonstrated the medication injection device and injection technique to the patient.  Showed patient the injection sites for his  medication To start with 2.5 mg dosage weekly for the first 4 injections and then increase the dose to 5 mg weekly Sample of 2.5 mg was given Patient brochure on Mounjaro and explained use of the available co-pay card    HYPERTENSION: Not well controlled and needs to follow-up with his PCP  Patient Instructions  Check blood sugars on waking up   Also check blood sugars about 2 hours after meals and do this after different meals by rotation  Recommended blood sugar levels on waking up are 90-130 and about 2 hours after meal is 130-160  Please bring your blood sugar monitor to each visit, thank you    Elayne Snare 04/29/2022, 11:35 AM

## 2022-04-30 MED ORDER — TIRZEPATIDE 5 MG/0.5ML ~~LOC~~ SOAJ
5.0000 mg | SUBCUTANEOUS | 1 refills | Status: DC
Start: 2022-04-30 — End: 2022-07-05

## 2022-06-02 ENCOUNTER — Emergency Department (HOSPITAL_BASED_OUTPATIENT_CLINIC_OR_DEPARTMENT_OTHER): Payer: 59 | Admitting: Radiology

## 2022-06-02 ENCOUNTER — Encounter (HOSPITAL_COMMUNITY): Payer: Self-pay

## 2022-06-02 ENCOUNTER — Encounter (HOSPITAL_BASED_OUTPATIENT_CLINIC_OR_DEPARTMENT_OTHER): Payer: Self-pay | Admitting: Emergency Medicine

## 2022-06-02 ENCOUNTER — Other Ambulatory Visit: Payer: Self-pay

## 2022-06-02 ENCOUNTER — Emergency Department (HOSPITAL_BASED_OUTPATIENT_CLINIC_OR_DEPARTMENT_OTHER): Payer: 59

## 2022-06-02 ENCOUNTER — Inpatient Hospital Stay (HOSPITAL_BASED_OUTPATIENT_CLINIC_OR_DEPARTMENT_OTHER)
Admission: EM | Admit: 2022-06-02 | Discharge: 2022-06-08 | DRG: 305 | Disposition: A | Discharge status: Home Health | Payer: 59 | Attending: Internal Medicine | Admitting: Internal Medicine

## 2022-06-02 DIAGNOSIS — E785 Hyperlipidemia, unspecified: Secondary | ICD-10-CM | POA: Diagnosis present

## 2022-06-02 DIAGNOSIS — Z818 Family history of other mental and behavioral disorders: Secondary | ICD-10-CM

## 2022-06-02 DIAGNOSIS — Z87891 Personal history of nicotine dependence: Secondary | ICD-10-CM

## 2022-06-02 DIAGNOSIS — R739 Hyperglycemia, unspecified: Secondary | ICD-10-CM

## 2022-06-02 DIAGNOSIS — E1122 Type 2 diabetes mellitus with diabetic chronic kidney disease: Secondary | ICD-10-CM | POA: Diagnosis present

## 2022-06-02 DIAGNOSIS — Z7985 Long-term (current) use of injectable non-insulin antidiabetic drugs: Secondary | ICD-10-CM

## 2022-06-02 DIAGNOSIS — I160 Hypertensive urgency: Secondary | ICD-10-CM | POA: Diagnosis not present

## 2022-06-02 DIAGNOSIS — Z91018 Allergy to other foods: Secondary | ICD-10-CM

## 2022-06-02 DIAGNOSIS — N184 Chronic kidney disease, stage 4 (severe): Secondary | ICD-10-CM | POA: Diagnosis present

## 2022-06-02 DIAGNOSIS — I129 Hypertensive chronic kidney disease with stage 1 through stage 4 chronic kidney disease, or unspecified chronic kidney disease: Secondary | ICD-10-CM | POA: Diagnosis present

## 2022-06-02 DIAGNOSIS — H538 Other visual disturbances: Secondary | ICD-10-CM | POA: Diagnosis present

## 2022-06-02 DIAGNOSIS — R110 Nausea: Secondary | ICD-10-CM | POA: Diagnosis present

## 2022-06-02 DIAGNOSIS — R000 Tachycardia, unspecified: Secondary | ICD-10-CM | POA: Diagnosis present

## 2022-06-02 DIAGNOSIS — Z794 Long term (current) use of insulin: Secondary | ICD-10-CM

## 2022-06-02 DIAGNOSIS — Z888 Allergy status to other drugs, medicaments and biological substances status: Secondary | ICD-10-CM

## 2022-06-02 DIAGNOSIS — Z83438 Family history of other disorder of lipoprotein metabolism and other lipidemia: Secondary | ICD-10-CM

## 2022-06-02 DIAGNOSIS — D631 Anemia in chronic kidney disease: Secondary | ICD-10-CM | POA: Diagnosis present

## 2022-06-02 DIAGNOSIS — K529 Noninfective gastroenteritis and colitis, unspecified: Secondary | ICD-10-CM | POA: Diagnosis present

## 2022-06-02 DIAGNOSIS — E11319 Type 2 diabetes mellitus with unspecified diabetic retinopathy without macular edema: Secondary | ICD-10-CM | POA: Diagnosis present

## 2022-06-02 DIAGNOSIS — R6510 Systemic inflammatory response syndrome (SIRS) of non-infectious origin without acute organ dysfunction: Secondary | ICD-10-CM | POA: Diagnosis present

## 2022-06-02 DIAGNOSIS — R112 Nausea with vomiting, unspecified: Secondary | ICD-10-CM | POA: Diagnosis present

## 2022-06-02 DIAGNOSIS — Z823 Family history of stroke: Secondary | ICD-10-CM

## 2022-06-02 DIAGNOSIS — H3530 Unspecified macular degeneration: Secondary | ICD-10-CM | POA: Diagnosis present

## 2022-06-02 DIAGNOSIS — E1143 Type 2 diabetes mellitus with diabetic autonomic (poly)neuropathy: Secondary | ICD-10-CM | POA: Diagnosis present

## 2022-06-02 DIAGNOSIS — W19XXXA Unspecified fall, initial encounter: Secondary | ICD-10-CM | POA: Diagnosis present

## 2022-06-02 DIAGNOSIS — Z20822 Contact with and (suspected) exposure to covid-19: Secondary | ICD-10-CM | POA: Diagnosis present

## 2022-06-02 DIAGNOSIS — D649 Anemia, unspecified: Secondary | ICD-10-CM | POA: Diagnosis present

## 2022-06-02 DIAGNOSIS — Z79899 Other long term (current) drug therapy: Secondary | ICD-10-CM

## 2022-06-02 DIAGNOSIS — N189 Chronic kidney disease, unspecified: Secondary | ICD-10-CM | POA: Diagnosis present

## 2022-06-02 DIAGNOSIS — E1165 Type 2 diabetes mellitus with hyperglycemia: Secondary | ICD-10-CM | POA: Diagnosis present

## 2022-06-02 DIAGNOSIS — D638 Anemia in other chronic diseases classified elsewhere: Secondary | ICD-10-CM | POA: Diagnosis present

## 2022-06-02 DIAGNOSIS — N179 Acute kidney failure, unspecified: Secondary | ICD-10-CM | POA: Diagnosis present

## 2022-06-02 DIAGNOSIS — M4620 Osteomyelitis of vertebra, site unspecified: Secondary | ICD-10-CM | POA: Diagnosis present

## 2022-06-02 DIAGNOSIS — F411 Generalized anxiety disorder: Secondary | ICD-10-CM | POA: Diagnosis present

## 2022-06-02 DIAGNOSIS — Z833 Family history of diabetes mellitus: Secondary | ICD-10-CM

## 2022-06-02 DIAGNOSIS — A419 Sepsis, unspecified organism: Principal | ICD-10-CM

## 2022-06-02 LAB — COMPREHENSIVE METABOLIC PANEL
ALT: 10 U/L (ref 0–44)
AST: 16 U/L (ref 15–41)
Albumin: 4.2 g/dL (ref 3.5–5.0)
Alkaline Phosphatase: 138 U/L — ABNORMAL HIGH (ref 38–126)
Anion gap: 15 (ref 5–15)
BUN: 51 mg/dL — ABNORMAL HIGH (ref 6–20)
CO2: 21 mmol/L — ABNORMAL LOW (ref 22–32)
Calcium: 8.9 mg/dL (ref 8.9–10.3)
Chloride: 101 mmol/L (ref 98–111)
Creatinine, Ser: 3.78 mg/dL — ABNORMAL HIGH (ref 0.61–1.24)
GFR, Estimated: 19 mL/min — ABNORMAL LOW (ref >60)
Glucose, Bld: 258 mg/dL — ABNORMAL HIGH (ref 70–99)
Potassium: 4.9 mmol/L (ref 3.5–5.1)
Sodium: 137 mmol/L (ref 135–145)
Total Bilirubin: 0.7 mg/dL (ref 0.3–1.2)
Total Protein: 7.5 g/dL (ref 6.5–8.1)

## 2022-06-02 LAB — URINALYSIS, ROUTINE W REFLEX MICROSCOPIC
Bilirubin Urine: NEGATIVE
Glucose, UA: 500 mg/dL — AB
Leukocytes,Ua: NEGATIVE
Nitrite: NEGATIVE
Protein, ur: >300 mg/dL — AB
Specific Gravity, Urine: 1.016 (ref 1.005–1.030)
pH: 6.5 (ref 5.0–8.0)

## 2022-06-02 LAB — SEDIMENTATION RATE: Sed Rate: 72 mm/hr — ABNORMAL HIGH (ref 0–16)

## 2022-06-02 LAB — CBC
HCT: 29.9 % — ABNORMAL LOW (ref 39.0–52.0)
Hemoglobin: 10.5 g/dL — ABNORMAL LOW (ref 13.0–17.0)
MCH: 30.1 pg (ref 26.0–34.0)
MCHC: 35.1 g/dL (ref 30.0–36.0)
MCV: 85.7 fL (ref 80.0–100.0)
Platelets: 298 10*3/uL (ref 150–400)
RBC: 3.49 MIL/uL — ABNORMAL LOW (ref 4.22–5.81)
RDW: 12.3 % (ref 11.5–15.5)
WBC: 17.2 10*3/uL — ABNORMAL HIGH (ref 4.0–10.5)
nRBC: 0 % (ref 0.0–0.2)

## 2022-06-02 LAB — LIPASE, BLOOD: Lipase: 11 U/L (ref 11–51)

## 2022-06-02 LAB — RESP PANEL BY RT-PCR (FLU A&B, COVID) ARPGX2
Influenza A by PCR: NEGATIVE
Influenza B by PCR: NEGATIVE
SARS Coronavirus 2 by RT PCR: NEGATIVE

## 2022-06-02 LAB — LACTIC ACID, PLASMA
Lactic Acid, Venous: 0.8 mmol/L (ref 0.5–1.9)
Lactic Acid, Venous: 0.8 mmol/L (ref 0.5–1.9)

## 2022-06-02 LAB — APTT: aPTT: 26 seconds (ref 24–36)

## 2022-06-02 LAB — C-REACTIVE PROTEIN: CRP: 0.8 mg/dL (ref <1.0)

## 2022-06-02 LAB — PROTIME-INR
INR: 1.1 (ref 0.8–1.2)
Prothrombin Time: 13.8 seconds (ref 11.4–15.2)

## 2022-06-02 LAB — CBG MONITORING, ED: Glucose-Capillary: 240 mg/dL — ABNORMAL HIGH (ref 70–99)

## 2022-06-02 LAB — SARS CORONAVIRUS 2 BY RT PCR: SARS Coronavirus 2 by RT PCR: NEGATIVE

## 2022-06-02 MED ORDER — VANCOMYCIN HCL IN DEXTROSE 1-5 GM/200ML-% IV SOLN
1000.0000 mg | Freq: Once | INTRAVENOUS | Status: DC
  Filled 2022-06-02: qty 200

## 2022-06-02 MED ORDER — LACTATED RINGERS IV BOLUS (SEPSIS)
1000.0000 mL | Freq: Once | INTRAVENOUS | Status: AC
  Administered 2022-06-02: 1000 mL via INTRAVENOUS

## 2022-06-02 MED ORDER — VANCOMYCIN HCL IN DEXTROSE 1-5 GM/200ML-% IV SOLN
1000.0000 mg | Freq: Once | INTRAVENOUS | Status: AC
  Administered 2022-06-02: 1000 mg via INTRAVENOUS

## 2022-06-02 MED ORDER — MORPHINE SULFATE (PF) 4 MG/ML IV SOLN
6.0000 mg | Freq: Once | INTRAVENOUS | Status: AC
  Administered 2022-06-02: 6 mg via INTRAVENOUS
  Filled 2022-06-02: qty 2

## 2022-06-02 MED ORDER — ONDANSETRON 4 MG PO TBDP
4.0000 mg | ORAL_TABLET | Freq: Once | ORAL | Status: AC
  Administered 2022-06-02: 4 mg via ORAL
  Filled 2022-06-02: qty 1

## 2022-06-02 MED ORDER — VANCOMYCIN VARIABLE DOSE PER UNSTABLE RENAL FUNCTION (PHARMACIST DOSING)
Status: DC
  Filled 2022-06-02: qty 1

## 2022-06-02 MED ORDER — ONDANSETRON HCL 4 MG/2ML IJ SOLN
4.0000 mg | Freq: Once | INTRAMUSCULAR | Status: AC
Start: 2022-06-02 — End: 2022-06-02
  Administered 2022-06-02: 4 mg via INTRAVENOUS
  Filled 2022-06-02: qty 2

## 2022-06-02 MED ORDER — HYDROMORPHONE HCL 1 MG/ML IJ SOLN
1.0000 mg | Freq: Once | INTRAMUSCULAR | Status: AC
  Administered 2022-06-02: 1 mg via INTRAVENOUS
  Filled 2022-06-02: qty 1

## 2022-06-02 MED ORDER — METRONIDAZOLE 500 MG/100ML IV SOLN
500.0000 mg | Freq: Once | INTRAVENOUS | Status: AC
  Administered 2022-06-02: 500 mg via INTRAVENOUS
  Filled 2022-06-02: qty 100

## 2022-06-02 MED ORDER — LACTATED RINGERS IV SOLN: INTRAVENOUS | Status: AC

## 2022-06-02 MED ORDER — LORAZEPAM 2 MG/ML IJ SOLN
1.0000 mg | INTRAMUSCULAR | Status: DC | PRN
  Administered 2022-06-02 – 2022-06-03 (×3): 1 mg via INTRAVENOUS
  Filled 2022-06-02 (×3): qty 1

## 2022-06-02 MED ORDER — SODIUM CHLORIDE 0.9 % IV SOLN
2.0000 g | Freq: Once | INTRAVENOUS | Status: AC
  Administered 2022-06-02: 2 g via INTRAVENOUS
  Filled 2022-06-02: qty 12.5

## 2022-06-02 MED ORDER — LABETALOL HCL 5 MG/ML IV SOLN
20.0000 mg | Freq: Once | INTRAVENOUS | Status: AC
Start: 2022-06-02 — End: 2022-06-02
  Administered 2022-06-02: 20 mg via INTRAVENOUS
  Filled 2022-06-02: qty 4

## 2022-06-02 MED ORDER — CEFEPIME HCL 2 G IV SOLR
2.0000 g | INTRAVENOUS | Status: DC
  Administered 2022-06-03: 2 g via INTRAVENOUS
  Filled 2022-06-02: qty 12.5

## 2022-06-02 MED ORDER — ACETAMINOPHEN 500 MG PO TABS
1000.0000 mg | ORAL_TABLET | Freq: Once | ORAL | Status: AC
  Administered 2022-06-02: 1000 mg via ORAL
  Filled 2022-06-02: qty 2

## 2022-06-02 NOTE — ED Triage Notes (Signed)
Pt here with c/o n/v and along with maybe a low grade temp , all started Monday , cbg 240

## 2022-06-02 NOTE — Sepsis Progress Note (Signed)
[facility]-ED called for follow up action, to get Code Sepsis bundle running.

## 2022-06-02 NOTE — Progress Notes (Signed)
Pharmacy Antibiotic Note  Jose [name] is a 46 y.o. male admitted on 06/02/2022 with sepsis.  Pharmacy has been consulted for vancomycin and cefepime dosing. Pt with Tmax 99.9 and WBC is elevated at 17.2. Scr is elevated above baseline at 3.78.   Plan: Vancomycin 2gm IV x 1 then trend Scr for further doses Cefepime 2gm IV Q24H F/u renal fxn, C&S, clinical status and peak/trough at SS  Height: 5\' 11"  (180.3 cm) Weight: 95.3 kg (210 lb) IBW/kg (Calculated) : 75.3  Temp (24hrs), Avg:99.3 F (37.4 C), Min:98.6 F (37 C), Max:99.9 F (37.7 C)  Recent Labs  Lab 06/02/22 1212  WBC 17.2*  CREATININE 3.78*    Estimated Creatinine Clearance: 28.2 mL/min (A) (by C-G formula based on SCr of 3.78 mg/dL (H)).    Allergies  Allergen Reactions   Cranberry Itching   Hm Lidocaine Patch [Lidocaine] Dermatitis    Blisters skin    Melatonin Other (See Comments)    nightmares    Antimicrobials this admission: Vanc 8/9>> Cefepime 8/9>> Flagyl x 1 8/9  Dose adjustments this admission: N/A  Microbiology results: Pending  Thank you for allowing pharmacy to be a part of this patient's care.  Oddis Westling, Rande Lawman 06/02/2022 2:44 PM

## 2022-06-02 NOTE — ED Notes (Addendum)
2x other IV attempts by this RN; unsuccessful. Unable to obtain 2nd blood cultures and IV at this time. Provider aware

## 2022-06-02 NOTE — Sepsis Progress Note (Signed)
Code Sepsis protocol being monitored by eLink. 

## 2022-06-02 NOTE — ED Notes (Signed)
Pt SpO2 87-90% following dilaudid. Placed on 2L [place] with improvement.

## 2022-06-02 NOTE — ED Notes (Signed)
Provider at bedside inserting [place] IV at this time.

## 2022-06-02 NOTE — ED Notes (Signed)
Consent to transfer form signed.  

## 2022-06-02 NOTE — ED Notes (Signed)
Patient transported to CT 

## 2022-06-02 NOTE — ED Notes (Signed)
Wife at bedside, took pt belongings home: [place]oes, [place] and [place].

## 2022-06-02 NOTE — Sepsis Progress Note (Signed)
Notified bedside nurse of need to draw and administer lactic acid, blood cultures, and fluid bolus.  

## 2022-06-02 NOTE — ED Notes (Signed)
Pt states unable to obtain urine sample at this time.

## 2022-06-02 NOTE — ED Provider Notes (Addendum)
[facility] EMERGENCY DEPT Provider Note   CSN: [id number] Arrival date & time: 06/02/22  1129     History  Chief Complaint  Patient presents with   Abdominal Pain    EDWORD CU is a 46 y.o. male.  The history is provided by the patient and medical records. No language interpreter was used.  Abdominal Pain    [place]ita is a 46yo male w/ hx of uncontrolled T2DM, diabetic retinopathy, osteomyelitis of thoracic region, CKD, and HTN presents to ED for upper back pain, nausea, vomiting x 3 days. Patient reports having dull achy pain in his back where he previously had surgery for osteomyelitis. This is new, as he has not had pain in that region since his surgery 1 year ago. He endorses having chills and he has not been able to keep any food down since Monday. He has been unable to take his meds d/t vomiting. He says the vomiting has been causing his abdominal pain. He also says he feels dizzy and more unsteady than usual, reporting that he had a fall on Monday. At baseline, he has a mild tremor of upper extremities, unsteady gait d/t neuropathy, and blurred/vision changes d/t diabetic retinopathy. Today he reports feeling like his tremor is worse than usual. Denies chest pain, SOB, tearing back pain.  Home Medications Prior to Admission medications   Medication Sig Start Date End Date Taking? Authorizing Provider  acetaminophen (TYLENOL) 325 MG tablet 1 capsule as needed    [provider]  albuterol (VENTOLIN HFA) 108 (90 Base) MCG/ACT inhaler Inhale 1 puff into the lungs every 4 (four) hours as needed for wheezing or shortness of breath. 05/27/21   [provider]  ALPRAZolam Duanne Moron) 0.25 MG tablet Take 1 tablet (0.25 mg total) by mouth 2 (two) times daily as needed for anxiety. 09/21/21   Love, Ivan Anchors, PA-C  amLODipine (NORVASC) 5 MG tablet Take 5 mg by mouth daily. 10/29/21   [provider]  atorvastatin (LIPITOR) 40 MG tablet Take 1 tablet by  mouth daily.    [provider]  bethanechol (URECHOLINE) 10 MG tablet Take 1 tablet (10 mg total) by mouth 3 (three) times daily. 09/21/21   Love, Ivan Anchors, PA-C  bisacodyl (DULCOLAX) 10 MG suppository Place 1 suppository (10 mg total) rectally daily as needed for moderate constipation. 08/29/21   Terrilee Croak, MD  cephALEXin (KEFLEX) 500 MG capsule Take 500 mg by mouth 3 (three) times daily. Patient not taking: Reported on 04/29/2022 12/10/21   [provider]  cholecalciferol (VITAMIN D3) 25 MCG (1000 UNIT) tablet 1 tablet    [provider]  collagenase (SANTYL) ointment Apply topically daily. 09/23/21   Love, Ivan Anchors, PA-C  Continuous Blood Gluc Sensor (FREESTYLE LIBRE 3 SENSOR) MISC 1 Device by Does not apply route every 14 (fourteen) days. Apply 1 sensor on upper arm every 14 days for continuous glucose monitoring 04/29/22   Elayne Snare, MD  furosemide (LASIX) 20 MG tablet Take 20 mg by mouth daily. 12/10/21   [provider]  gabapentin (NEURONTIN) 100 MG capsule Take 2 capsules (200 mg total) by mouth 2 (two) times daily. 09/21/21   Love, Ivan Anchors, PA-C  HYDROcodone-acetaminophen (NORCO) 5-325 MG tablet Take 1-2 tablets by mouth every 6 (six) hours as needed for severe pain. 01/01/22   Lovorn, Jinny Blossom, MD  hydrOXYzine (ATARAX) 25 MG tablet Take 1 tablet (25 mg total) by mouth 3 (three) times daily as needed for anxiety or nausea. 01/13/22  Lovorn, Megan, MD  insulin glargine, 1 Unit Dial, (TOUJEO SOLOSTAR) 300 UNIT/ML Solostar Pen Inject 20 Units into the skin daily. 04/29/22   Elayne Snare, MD  insulin lispro (HUMALOG KWIKPEN) 100 UNIT/ML KwikPen 8-14 U ac tid 04/29/22   Elayne Snare, MD  Insulin Pen Needle (PENTIPS) 32G X 4 MM MISC use as directed 09/21/21   Love, Ivan Anchors, PA-C  methocarbamol (ROBAXIN) 500 MG tablet Take 1 tablet (500 mg total) by mouth every 6 (six) hours as needed for muscle spasms. 09/21/21   Love, Ivan Anchors, PA-C  metoprolol tartrate (LOPRESSOR)  50 MG tablet Take 1 tablet (50 mg total) by mouth 2 (two) times daily. 09/21/21   Love, Ivan Anchors, PA-C  mirtazapine (REMERON) 15 MG tablet Take 1 tablet (15 mg total) by mouth at bedtime. 09/21/21   Love, Ivan Anchors, PA-C  morphine (MS CONTIN) 15 MG 12 hr tablet Take 1 tablet (15 mg total) by mouth daily. Do Not Fill Before 10/25/2021 01/13/22   Courtney Heys, MD  Multiple Vitamin (MULTIVITAMIN ADULT PO) Take 1 tablet by mouth daily.    [provider]  naloxone [facility] nasal spray 4 mg/0.1 mL Use as needed for signs of overdose. 09/21/21   Love, Ivan Anchors, PA-C  nutrition supplement, JUVEN, (JUVEN) PACK Take 1 packet by mouth 2 (two) times daily between meals. 09/21/21   Love, Ivan Anchors, PA-C  ondansetron (ZOFRAN-ODT) 4 MG disintegrating tablet Take 4 mg by mouth daily. 12/28/21   [provider]  oxyCODONE-acetaminophen (PERCOCET) 10-325 MG tablet Take 1 tablet by mouth every 6 (six) hours as needed for pain. Can fill 09/28/21 09/22/21   Lovorn, Jinny Blossom, MD  pantoprazole (PROTONIX) 40 MG tablet Take 1 tablet (40 mg total) by mouth 2 (two) times daily. 09/21/21   Love, Ivan Anchors, PA-C  PARoxetine (PAXIL) 20 MG tablet Take 1 tablet (20 mg total) by mouth at bedtime. 09/21/21   Love, Ivan Anchors, PA-C  polyethylene glycol (MIRALAX / GLYCOLAX) 17 g packet Take 17 g by mouth daily as needed for moderate constipation, mild constipation or severe constipation. 08/29/21   Dahal, Marlowe Aschoff, MD  senna-docusate (SENOKOT-S) 8.6-50 MG tablet Take 2 tablets by mouth at bedtime as needed for mild constipation. 09/21/21   Love, Ivan Anchors, PA-C  sucralfate (CARAFATE) 1 GM/10ML suspension Take 10 mLs (1 g total) by mouth 4 (four) times daily -  with meals and at bedtime. 09/21/21   Love, Ivan Anchors, PA-C  tamsulosin (FLOMAX) 0.4 MG CAPS capsule Take 1 capsule (0.4 mg total) by mouth daily after supper. 09/21/21   Love, Ivan Anchors, PA-C  tirzepatide [facility]) 5 MG/0.5ML Pen Inject 5 mg into the skin once a week. 04/30/22    Elayne Snare, MD  tobramycin (TOBREX) 0.3 % ophthalmic solution SMARTSIG:In Eye(s) 11/03/21   [provider]      Allergies    Cranberry, Hm lidocaine patch [lidocaine], and Melatonin    Review of Systems   Review of Systems  Gastrointestinal:  Positive for abdominal pain.  All other systems reviewed and are negative.   Physical Exam Updated Vital Signs BP (!) 178/95 (BP Location: Right Arm)   Pulse (!) 118   Temp 98.6 F (37 C) (Oral)   Resp 17   Ht 5\' 11"  (1.803 m)   Wt 95.3 kg   SpO2 96%   BMI 29.29 kg/m  Physical Exam Vitals and nursing note reviewed.  Constitutional:      Appearance: He is well-developed. He is ill-appearing.  HENT:     Head: Atraumatic.  Eyes:     Conjunctiva/sclera: Conjunctivae normal.  Cardiovascular:     Rate and Rhythm: Tachycardia present.     Heart sounds: No murmur heard.    No friction rub. No gallop.  Pulmonary:     Effort: Pulmonary effort is normal.     Breath sounds: Normal breath sounds.  Abdominal:     General: Abdomen is flat.  Genitourinary:    Testes:        Left: Tenderness (Tenderness noted to mid thoracic spine at the level of T4-T5 overlying surgical scar without any significant erythema edema or warmth to the affected region.) present.  Musculoskeletal:     Cervical back: Neck supple.  Skin:    Findings: No rash.  Neurological:     Mental Status: He is alert and oriented to person, place, and time.     ED Results / Procedures / Treatments   Labs (all labs ordered are listed, but only abnormal results are displayed) Labs Reviewed  COMPREHENSIVE METABOLIC PANEL - Abnormal; Notable for the following components:      Result Value   CO2 21 (*)    Glucose, Bld 258 (*)    BUN 51 (*)    Creatinine, Ser 3.78 (*)    Alkaline Phosphatase 138 (*)    GFR, Estimated 19 (*)    All other components within normal limits  CBC - Abnormal; Notable for the following components:   WBC 17.2 (*)    RBC 3.49 (*)     Hemoglobin 10.5 (*)    HCT 29.9 (*)    All other components within normal limits  CBG MONITORING, ED - Abnormal; Notable for the following components:   Glucose-Capillary 240 (*)    All other components within normal limits  SARS CORONAVIRUS 2 BY RT PCR  RESP PANEL BY RT-PCR (FLU A&B, COVID) ARPGX2  CULTURE, BLOOD (ROUTINE X 2)  CULTURE, BLOOD (ROUTINE X 2)  URINE CULTURE  LIPASE, BLOOD  URINALYSIS, ROUTINE W REFLEX MICROSCOPIC  LACTIC ACID, PLASMA  LACTIC ACID, PLASMA  PROTIME-INR  APTT  C-REACTIVE PROTEIN  SEDIMENTATION RATE    EKG None  Radiology DG Chest 1 View  Result Date: 06/02/2022 CLINICAL DATA:  Cough, vomiting EXAM: CHEST  1 VIEW COMPARISON:  09/07/2021 FINDINGS: Transverse diameter of the heart is slightly increased. There are no signs of pulmonary edema or focal pulmonary consolidation. There is no pleural effusion or pneumothorax. IMPRESSION: No active disease. Electronically Signed   By: Elmer Picker M.D.   On: 06/02/2022 12:39    Procedures .Critical Care  Performed by: Domenic Moras, PA-C Authorized by: Domenic Moras, PA-C   Critical care provider statement:    Critical care time (minutes):  35   Critical care was time spent personally by me on the following activities:  Development of treatment plan with patient or surrogate, discussions with consultants, evaluation of patient's response to treatment, examination of patient, ordering and review of laboratory studies, ordering and review of radiographic studies, ordering and performing treatments and interventions, pulse oximetry, re-evaluation of patient's condition and review of old charts   EMERGENCY DEPARTMENT  US GUIDANCE EXAM Emergency Ultrasound:  US Guidance for Needle Guidance  INDICATIONS: Difficult vascular access Linear probe used in real-time to visualize location of needle entry through skin.   PERFORMED BY: Myself IMAGES ARCHIVED?: No LIMITATIONS: Pain VIEWS USED:  Transverse INTERPRETATION: Left arm and Needle gauge 20g     Medications Ordered in ED  Medications  lactated ringers infusion (has no administration in time range)  lactated ringers bolus 1,000 mL (has no administration in time range)    And  lactated ringers bolus 1,000 mL (has no administration in time range)    And  lactated ringers bolus 1,000 mL (has no administration in time range)  ceFEPIme (MAXIPIME) 2 g in sodium chloride 0.9 % 100 mL IVPB (has no administration in time range)  metroNIDAZOLE (FLAGYL) IVPB 500 mg (has no administration in time range)  vancomycin (VANCOCIN) IVPB 1000 mg/200 mL premix (has no administration in time range)  morphine (PF) 4 MG/ML injection 6 mg (has no administration in time range)  ondansetron (ZOFRAN) injection 4 mg (has no administration in time range)  vancomycin (VANCOCIN) IVPB 1000 mg/200 mL premix (has no administration in time range)  LORazepam (ATIVAN) injection 1 mg (has no administration in time range)  ceFEPIme (MAXIPIME) 2 g in sodium chloride 0.9 % 100 mL IVPB (has no administration in time range)  vancomycin variable dose per unstable renal function (pharmacist dosing) (has no administration in time range)  ondansetron (ZOFRAN-ODT) disintegrating tablet 4 mg (4 mg Oral Given 06/02/22 1235)    ED Course/ Medical Decision Making/ A&P Clinical Course as of 06/02/22 2209  Wed Jun 02, 2022  2208 Received signout from [place] PA. Pt awaiting transport at this time.  [RP]    Clinical Course User Index [RP] Fransico Meadow, MD                           Medical Decision Making Amount and/or Complexity of Data Reviewed Labs: ordered. Radiology: ordered. ECG/medicine tests: ordered.  Risk OTC drugs. Prescription drug management. Decision regarding hospitalization.   BP (!) 178/95 (BP Location: Right Arm)   Pulse (!) 118   Temp 98.6 F (37 C) (Oral)   Resp 17   Ht 5\' 11"  (1.803 m)   Wt 95.3 kg   SpO2 96%   BMI 29.29  kg/m   2:44 PM This is a 46 year old male with significant history of diabetes, history of osteomyelitis involving his mid thoracic spine that was diagnosed earlier in the year requiring surgical intervention presenting today with complaints of pain to his upper back as well as having fever and chills ongoing for the past 3 days.  Pain is sharp and aching moderate in severity and persistent.  Endorses significant fever and chills with associated nausea and vomiting.  States he has been dry heaving causing some abdominal discomfort but most of his complaint is to his back.  Patient states he worries he may have osteomyelitis again.  He denies any history of IV drug use or having active cancer.  Denies any chest pain shortness of breath cough or dysuria.  On exam this is an ill-appearing male having rigors and appears uncomfortable.  He does not have any nuchal rigidity on my exam but he does have tenderness along his thoracic mid spine at the level of T4-5 overlying well-appearing surgical scar.  He has some mild abdominal discomfort without any guarding or rebound tenderness.  He is dry heaving.  Does have weakness to his right lower extremity but this is chronic.  He has trace edema to right lower extremity as well.  Vital signs remarkable for an initial oral temperature of 99.9, pt is tachycardic with HR 118, BP is elevated at 178/95, no hypoxia.  Pt meets sepsis criteria, code sepsis initiated, will start broad spectrum  abx.  Pt will need to be transfer to [facility] for thoracic spine MRI and admission with concerns for recurrent osteomyelitis. Neurosurgeon is Dr. Kathyrn Sheriff.    3:20 PM Care discussed with Dr. Reather Converse.  I have reached out to attending at [facility], Dr. Sherry Ruffing who agrees to accept pt to [facility] for thoracis spine MRI to r/o osteomyelitis.  Pt is stable to be transfer.  He voice understanding and agrees with plan.  Additional US guided IV access done by me with access to L upper arm.    6:28 PM Patient is still here at [facility].  Since patient meets sepsis criteria and will need to be admitted I have consulted the admitting team spoke with hospitalist Dr. Velia Meyer who agrees to admit patient.  Patient remains tachycardic and hypertensive likely secondary to pain.  Additional opiate pain medication given.  7:32 PM Patient remains tachycardic and hypertensive.  Now endorse headache.  Will obtain head CT scan to ensure no hemorrhagic bleed and if negative, will provide blood pressure control.  Patient may benefit from additional pain management.  8:46 PM His CT scan is negative.  Patient still remains tachycardic and hypertensive.  I will provide additional pain management as well as blood pressure management.  I have low suspicion for aortic dissection causing his symptoms given that his pain is directly overlying the thoracic midline spine at the surgical site.  Patient also denies having pain in his chest or abdomen.  On reassessment pedal pulses intact and no abdominal bruit noted.  9:31 PM Improvement of symptoms after receiving additional pain medication and BP medication.  HR improves and BP improves.  Pt appears more comfortable, less rigors.   Pt sign out to oncoming provider.  Pt may need MRI of cervical/thoracic/lumbar for full assessment.  Radiology may need to weigh in as pt has impaired renal function.   This patient presents to the ED for concern of back pain, this involves an extensive number of treatment options, and is a complaint that carries with it a high risk of complications and morbidity.  The differential diagnosis includes osteomyelitis, discitis, sepsis, PNA, kidney stone, UTI  Co morbidities that complicate the patient evaluation hx of osteomyelitis  DM Additional history obtained:  Additional history obtained from wife External records from outside source obtained and reviewed including EMR including prior labs and imaging  Lab  Tests:  I Ordered, and personally interpreted labs.  The pertinent results include:  as above  Imaging Studies ordered:  I ordered imaging studies including CXR I independently visualized and interpreted imaging which showed no acute finding I agree with the radiologist interpretation  Cardiac Monitoring:  The patient was maintained on a cardiac monitor.  I personally viewed and interpreted the cardiac monitored which showed an underlying rhythm of: sinus tachycardia  Medicines ordered and prescription drug management:  I ordered medication including morphine  for pain Reevaluation of the patient after these medicines showed that the patient improved I have reviewed the patients home medicines and have made adjustments as needed  Test Considered: as above   Critical Interventions: broad spectrum abx  Sepsis protocol  Consultations Obtained:  I requested consultation with the attending Dr. Sherry Ruffing,  and discussed lab and imaging findings as well as pertinent plan - they recommend: transfer to [facility] for MRI of thoracic spine  Problem List / ED Course: upper back pain  Fever  sepsis  Reevaluation:  After the interventions noted above, I reevaluated the patient  and found that they have :improved  Social Determinants of Health: isolation, physically inactive  Dispostion:  After consideration of the diagnostic results and the patients response to treatment, I feel that the patent would benefit from transfer to [facility] for further work up.         Final Clinical Impression(s) / ED Diagnoses Final diagnoses:  Sepsis with acute renal failure without septic shock, due to unspecified organism, unspecified acute renal failure type [facility])  Hyperglycemia    Rx / DC Orders ED Discharge Orders     None         Domenic Moras, PA-C 06/02/22 1557    Domenic Moras, PA-C 06/02/22 1829    Domenic Moras, PA-C 06/02/22 2132    Domenic Moras, PA-C 06/02/22  2210    Elnora Morrison, MD 06/04/22 [phone]

## 2022-06-03 ENCOUNTER — Emergency Department (HOSPITAL_BASED_OUTPATIENT_CLINIC_OR_DEPARTMENT_OTHER): Payer: 59

## 2022-06-03 DIAGNOSIS — E1165 Type 2 diabetes mellitus with hyperglycemia: Secondary | ICD-10-CM | POA: Diagnosis present

## 2022-06-03 DIAGNOSIS — N184 Chronic kidney disease, stage 4 (severe): Secondary | ICD-10-CM | POA: Diagnosis present

## 2022-06-03 DIAGNOSIS — R739 Hyperglycemia, unspecified: Secondary | ICD-10-CM | POA: Diagnosis present

## 2022-06-03 DIAGNOSIS — Z794 Long term (current) use of insulin: Secondary | ICD-10-CM | POA: Diagnosis not present

## 2022-06-03 DIAGNOSIS — N189 Chronic kidney disease, unspecified: Secondary | ICD-10-CM | POA: Diagnosis not present

## 2022-06-03 DIAGNOSIS — W19XXXA Unspecified fall, initial encounter: Secondary | ICD-10-CM | POA: Diagnosis present

## 2022-06-03 DIAGNOSIS — R000 Tachycardia, unspecified: Secondary | ICD-10-CM | POA: Diagnosis present

## 2022-06-03 DIAGNOSIS — I160 Hypertensive urgency: Secondary | ICD-10-CM

## 2022-06-03 DIAGNOSIS — Z83438 Family history of other disorder of lipoprotein metabolism and other lipidemia: Secondary | ICD-10-CM | POA: Diagnosis not present

## 2022-06-03 DIAGNOSIS — D649 Anemia, unspecified: Secondary | ICD-10-CM

## 2022-06-03 DIAGNOSIS — K529 Noninfective gastroenteritis and colitis, unspecified: Secondary | ICD-10-CM | POA: Diagnosis present

## 2022-06-03 DIAGNOSIS — E1122 Type 2 diabetes mellitus with diabetic chronic kidney disease: Secondary | ICD-10-CM | POA: Diagnosis present

## 2022-06-03 DIAGNOSIS — Z818 Family history of other mental and behavioral disorders: Secondary | ICD-10-CM | POA: Diagnosis not present

## 2022-06-03 DIAGNOSIS — I129 Hypertensive chronic kidney disease with stage 1 through stage 4 chronic kidney disease, or unspecified chronic kidney disease: Secondary | ICD-10-CM | POA: Diagnosis present

## 2022-06-03 DIAGNOSIS — D631 Anemia in chronic kidney disease: Secondary | ICD-10-CM | POA: Diagnosis present

## 2022-06-03 DIAGNOSIS — F411 Generalized anxiety disorder: Secondary | ICD-10-CM | POA: Diagnosis present

## 2022-06-03 DIAGNOSIS — R6510 Systemic inflammatory response syndrome (SIRS) of non-infectious origin without acute organ dysfunction: Secondary | ICD-10-CM

## 2022-06-03 DIAGNOSIS — Z833 Family history of diabetes mellitus: Secondary | ICD-10-CM | POA: Diagnosis not present

## 2022-06-03 DIAGNOSIS — H538 Other visual disturbances: Secondary | ICD-10-CM | POA: Diagnosis present

## 2022-06-03 DIAGNOSIS — Z87891 Personal history of nicotine dependence: Secondary | ICD-10-CM | POA: Diagnosis not present

## 2022-06-03 DIAGNOSIS — R112 Nausea with vomiting, unspecified: Secondary | ICD-10-CM

## 2022-06-03 DIAGNOSIS — N179 Acute kidney failure, unspecified: Secondary | ICD-10-CM

## 2022-06-03 DIAGNOSIS — Z79899 Other long term (current) drug therapy: Secondary | ICD-10-CM | POA: Diagnosis not present

## 2022-06-03 DIAGNOSIS — H3530 Unspecified macular degeneration: Secondary | ICD-10-CM | POA: Diagnosis present

## 2022-06-03 DIAGNOSIS — R110 Nausea: Secondary | ICD-10-CM | POA: Diagnosis present

## 2022-06-03 DIAGNOSIS — Z823 Family history of stroke: Secondary | ICD-10-CM | POA: Diagnosis not present

## 2022-06-03 DIAGNOSIS — E11319 Type 2 diabetes mellitus with unspecified diabetic retinopathy without macular edema: Secondary | ICD-10-CM | POA: Diagnosis present

## 2022-06-03 DIAGNOSIS — M4620 Osteomyelitis of vertebra, site unspecified: Secondary | ICD-10-CM

## 2022-06-03 DIAGNOSIS — E785 Hyperlipidemia, unspecified: Secondary | ICD-10-CM | POA: Diagnosis present

## 2022-06-03 DIAGNOSIS — Z20822 Contact with and (suspected) exposure to covid-19: Secondary | ICD-10-CM | POA: Diagnosis present

## 2022-06-03 DIAGNOSIS — E1143 Type 2 diabetes mellitus with diabetic autonomic (poly)neuropathy: Secondary | ICD-10-CM | POA: Diagnosis present

## 2022-06-03 LAB — HIV ANTIBODY (ROUTINE TESTING W REFLEX): HIV Screen 4th Generation wRfx: NONREACTIVE

## 2022-06-03 LAB — GLUCOSE, CAPILLARY
Glucose-Capillary: 159 mg/dL — ABNORMAL HIGH (ref 70–99)
Glucose-Capillary: 108 mg/dL — ABNORMAL HIGH (ref 70–99)
Glucose-Capillary: 53 mg/dL — ABNORMAL LOW (ref 70–99)
Glucose-Capillary: 57 mg/dL — ABNORMAL LOW (ref 70–99)
Glucose-Capillary: 113 mg/dL — ABNORMAL HIGH (ref 70–99)

## 2022-06-03 LAB — BASIC METABOLIC PANEL
Anion gap: 8 (ref 5–15)
BUN: 41 mg/dL — ABNORMAL HIGH (ref 6–20)
CO2: 24 mmol/L (ref 22–32)
Calcium: 8.7 mg/dL — ABNORMAL LOW (ref 8.9–10.3)
Chloride: 107 mmol/L (ref 98–111)
Creatinine, Ser: 3.33 mg/dL — ABNORMAL HIGH (ref 0.61–1.24)
GFR, Estimated: 22 mL/min — ABNORMAL LOW (ref >60)
Glucose, Bld: 191 mg/dL — ABNORMAL HIGH (ref 70–99)
Potassium: 4.5 mmol/L (ref 3.5–5.1)
Sodium: 139 mmol/L (ref 135–145)

## 2022-06-03 LAB — CBG MONITORING, ED: Glucose-Capillary: 182 mg/dL — ABNORMAL HIGH (ref 70–99)

## 2022-06-03 MED ORDER — INSULIN ASPART 100 UNIT/ML IJ SOLN: 0.0000 [IU] | Freq: Every day | INTRAMUSCULAR | Status: DC

## 2022-06-03 MED ORDER — HYDROXYZINE HCL 25 MG PO TABS
25.0000 mg | ORAL_TABLET | Freq: Three times a day (TID) | ORAL | Status: DC | PRN
Start: 2022-06-03 — End: 2022-06-08
  Administered 2022-06-05: 25 mg via ORAL
  Filled 2022-06-03: qty 1

## 2022-06-03 MED ORDER — SODIUM CHLORIDE 0.9 % IV SOLN: INTRAVENOUS | Status: DC | PRN

## 2022-06-03 MED ORDER — ONDANSETRON HCL 4 MG/2ML IJ SOLN
4.0000 mg | Freq: Once | INTRAMUSCULAR | Status: AC
  Administered 2022-06-03: 4 mg via INTRAVENOUS
  Filled 2022-06-03: qty 2

## 2022-06-03 MED ORDER — HYDRALAZINE HCL 20 MG/ML IJ SOLN
10.0000 mg | INTRAMUSCULAR | Status: DC | PRN
  Administered 2022-06-04 – 2022-06-05 (×2): 10 mg via INTRAVENOUS
  Filled 2022-06-03 (×2): qty 1

## 2022-06-03 MED ORDER — HEPARIN SODIUM (PORCINE) 5000 UNIT/ML IJ SOLN
5000.0000 [IU] | Freq: Three times a day (TID) | INTRAMUSCULAR | Status: DC
  Administered 2022-06-03 – 2022-06-08 (×16): 5000 [IU] via SUBCUTANEOUS
  Filled 2022-06-03 (×16): qty 1

## 2022-06-03 MED ORDER — GADOBUTROL 1 MMOL/ML IV SOLN
9.5000 mL | Freq: Once | INTRAVENOUS | Status: AC | PRN
  Administered 2022-06-03: 9.5 mL via INTRAVENOUS

## 2022-06-03 MED ORDER — ACETAMINOPHEN 325 MG PO TABS
650.0000 mg | ORAL_TABLET | Freq: Four times a day (QID) | ORAL | Status: DC | PRN
  Administered 2022-06-05: 650 mg via ORAL
  Filled 2022-06-03: qty 2

## 2022-06-03 MED ORDER — PROCHLORPERAZINE EDISYLATE 10 MG/2ML IJ SOLN
10.0000 mg | Freq: Four times a day (QID) | INTRAMUSCULAR | Status: DC | PRN
  Administered 2022-06-03 – 2022-06-07 (×2): 10 mg via INTRAVENOUS
  Filled 2022-06-03 (×2): qty 2

## 2022-06-03 MED ORDER — DEXTROSE 50 % IV SOLN
INTRAVENOUS | Status: AC
  Administered 2022-06-03: 12.5 g via INTRAVENOUS
  Filled 2022-06-03: qty 50

## 2022-06-03 MED ORDER — ONDANSETRON HCL 4 MG/2ML IJ SOLN
4.0000 mg | Freq: Four times a day (QID) | INTRAMUSCULAR | Status: DC
  Administered 2022-06-03 – 2022-06-08 (×21): 4 mg via INTRAVENOUS
  Filled 2022-06-03 (×22): qty 2

## 2022-06-03 MED ORDER — HYDROMORPHONE HCL 1 MG/ML IJ SOLN
1.0000 mg | INTRAMUSCULAR | Status: DC | PRN
  Administered 2022-06-03: 1 mg via INTRAVENOUS
  Filled 2022-06-03: qty 1

## 2022-06-03 MED ORDER — BETHANECHOL CHLORIDE 10 MG PO TABS
10.0000 mg | ORAL_TABLET | Freq: Three times a day (TID) | ORAL | Status: DC
  Administered 2022-06-03 – 2022-06-08 (×13): 10 mg via ORAL
  Filled 2022-06-03 (×17): qty 1

## 2022-06-03 MED ORDER — METOPROLOL TARTRATE 50 MG PO TABS
50.0000 mg | ORAL_TABLET | Freq: Two times a day (BID) | ORAL | Status: DC
  Administered 2022-06-03 – 2022-06-05 (×5): 50 mg via ORAL
  Filled 2022-06-03 (×5): qty 1

## 2022-06-03 MED ORDER — AMLODIPINE BESYLATE 5 MG PO TABS
5.0000 mg | ORAL_TABLET | Freq: Every day | ORAL | Status: DC
  Administered 2022-06-04: 5 mg via ORAL
  Filled 2022-06-03: qty 1

## 2022-06-03 MED ORDER — OXYCODONE-ACETAMINOPHEN 5-325 MG PO TABS
1.0000 | ORAL_TABLET | ORAL | Status: DC | PRN
  Administered 2022-06-04 – 2022-06-05 (×2): 1 via ORAL
  Filled 2022-06-03 (×2): qty 1

## 2022-06-03 MED ORDER — INSULIN ASPART 100 UNIT/ML IJ SOLN
0.0000 [IU] | Freq: Three times a day (TID) | INTRAMUSCULAR | Status: DC
  Administered 2022-06-03: 3 [IU] via SUBCUTANEOUS
  Administered 2022-06-06 – 2022-06-07 (×3): 2 [IU] via SUBCUTANEOUS

## 2022-06-03 MED ORDER — INSULIN GLARGINE-YFGN 100 UNIT/ML ~~LOC~~ SOLN
15.0000 [IU] | Freq: Every day | SUBCUTANEOUS | Status: DC
  Administered 2022-06-03 – 2022-06-08 (×6): 15 [IU] via SUBCUTANEOUS
  Filled 2022-06-03 (×6): qty 0.15

## 2022-06-03 MED ORDER — MIRTAZAPINE 15 MG PO TABS
15.0000 mg | ORAL_TABLET | Freq: Every day | ORAL | Status: DC
  Administered 2022-06-03 – 2022-06-07 (×5): 15 mg via ORAL
  Filled 2022-06-03 (×5): qty 1

## 2022-06-03 MED ORDER — HYDROMORPHONE HCL 1 MG/ML IJ SOLN
0.5000 mg | INTRAMUSCULAR | Status: DC | PRN
  Administered 2022-06-03 – 2022-06-05 (×4): 0.5 mg via INTRAVENOUS
  Filled 2022-06-03 (×4): qty 0.5

## 2022-06-03 MED ORDER — HYDROMORPHONE HCL 1 MG/ML IJ SOLN
1.0000 mg | Freq: Once | INTRAMUSCULAR | Status: AC
  Administered 2022-06-03: 1 mg via INTRAVENOUS
  Filled 2022-06-03: qty 1

## 2022-06-03 MED ORDER — ALBUTEROL SULFATE (2.5 MG/3ML) 0.083% IN NEBU: 2.5000 mg | INHALATION_SOLUTION | Freq: Four times a day (QID) | RESPIRATORY_TRACT | Status: DC | PRN

## 2022-06-03 MED ORDER — PAROXETINE HCL 20 MG PO TABS
20.0000 mg | ORAL_TABLET | Freq: Every day | ORAL | Status: DC
Start: 2022-06-03 — End: 2022-06-08
  Administered 2022-06-03 – 2022-06-07 (×5): 20 mg via ORAL
  Filled 2022-06-03 (×5): qty 1

## 2022-06-03 MED ORDER — ONDANSETRON HCL 4 MG PO TABS: 4.0000 mg | ORAL_TABLET | Freq: Four times a day (QID) | ORAL | Status: DC | PRN

## 2022-06-03 MED ORDER — AMLODIPINE BESYLATE 5 MG PO TABS
5.0000 mg | ORAL_TABLET | Freq: Once | ORAL | Status: AC
  Administered 2022-06-03: 5 mg via ORAL
  Filled 2022-06-03: qty 1

## 2022-06-03 MED ORDER — SODIUM CHLORIDE 0.9% FLUSH
3.0000 mL | Freq: Two times a day (BID) | INTRAVENOUS | Status: DC
  Administered 2022-06-03 – 2022-06-08 (×11): 3 mL via INTRAVENOUS

## 2022-06-03 MED ORDER — ORAL CARE MOUTH RINSE: 15.0000 mL | OROMUCOSAL | Status: DC | PRN

## 2022-06-03 MED ORDER — ONDANSETRON HCL 4 MG/2ML IJ SOLN
4.0000 mg | Freq: Four times a day (QID) | INTRAMUSCULAR | Status: DC | PRN
  Administered 2022-06-06: 4 mg via INTRAVENOUS
  Filled 2022-06-03 (×2): qty 2

## 2022-06-03 MED ORDER — INSULIN GLARGINE-YFGN 100 UNIT/ML ~~LOC~~ SOLN: 10.0000 [IU] | Freq: Every day | SUBCUTANEOUS | Status: DC

## 2022-06-03 MED ORDER — ATORVASTATIN CALCIUM 40 MG PO TABS
40.0000 mg | ORAL_TABLET | Freq: Every day | ORAL | Status: DC
  Filled 2022-06-03: qty 1

## 2022-06-03 MED ORDER — ACETAMINOPHEN 650 MG RE SUPP: 650.0000 mg | Freq: Four times a day (QID) | RECTAL | Status: DC | PRN

## 2022-06-03 MED ORDER — VANCOMYCIN HCL IN DEXTROSE 1-5 GM/200ML-% IV SOLN
1000.0000 mg | Freq: Once | INTRAVENOUS | Status: AC
  Administered 2022-06-03: 1000 mg via INTRAVENOUS
  Filled 2022-06-03: qty 200

## 2022-06-03 MED ORDER — DEXTROSE 50 % IV SOLN: 12.5000 g | INTRAVENOUS | Status: AC

## 2022-06-03 MED ORDER — TAMSULOSIN HCL 0.4 MG PO CAPS
0.4000 mg | ORAL_CAPSULE | Freq: Every day | ORAL | Status: DC
  Administered 2022-06-03 – 2022-06-07 (×5): 0.4 mg via ORAL
  Filled 2022-06-03 (×5): qty 1

## 2022-06-03 NOTE — H&P (Signed)
History and Physical    Patient: Jose [name]:[id number] DOB: [birth year] DOA: 06/02/2022 DOS: the patient was seen and examined on 06/03/2022 PCP: [name], NP  Patient coming from: Transfer from [place] Complaint:  Chief Complaint  Patient presents with   Upper back pain   HPI: Jose [name] is a 46 y.o. male with medical history significant of hypertension, hyperlipidemia, diabetes mellitus type 2, CKD stage IV, and history of thoracic osteomyelitis/discitis who presented with complaints o of feeling unwell for the last 4 days.  Patient reports that he has been not able to keep any food or liquids down.  He reports having pain in his back where he previously had osteomyelitis and required surgery last year.  He had been on antibiotics up until April of this year when it was discontinued after following with ID.  He states that the pain is dull, but becomes sharp and throbbing with any kind of movement.  Due to the symptoms he has been unable to sleep at night and has had subjective fever and chills.  He did report having mild tremor, unsteady gait due to neuropathy, blurry vision related to diabetic retinopathy.  He denies having any chest pain, shortness of breath, or dysuria symptoms.  On admission into the emergency department patient was seen to be afebrile with pulse 10 3-124, respirations elevated up to 25 and blood pressure as high as 220/124, and O2 saturations as low as 90% with improvement on 2 L of nasal cannula oxygen. Labs from yesterday significant for WBC 17.2, hemoglobin 10.5, BUN 51, creatinine 3.78, glucose 258, CRP 0.8, CRP 72, and lactic acid 0.8.  Chest x-ray noted no acute abnormality.  Urinalysis showed no signs of infection, but was positive for glucose and ketones. Influenza and COVID-19 screening were negative.  Blood cultures had been obtained.  Patient received bolus of 3 L of IV fluids, acetaminophen, pain medication, antiemetics, vancomycin,  cefepime, and metronidazole.  MRI of the thoracic spine showed sequela of discitis/osteomyelitis at T4-T5 with persistent but substantially decreased marrow edema and enhancement.   Review of Systems: As mentioned in the history of present illness. All other systems reviewed and are negative. Past Medical History:  Diagnosis Date   AKI (acute kidney injury) ([place])    GAD (generalized anxiety disorder)    Hyperlipidemia    Macular degeneration, bilateral    Retinopathy    Type II diabetes mellitus with complication, uncontrolled    retinopathy, neuropathy, microalbuminuria   Past Surgical History:  Procedure Laterality Date   APPENDECTOMY     BIOPSY  08/10/2021   Procedure: BIOPSY;  Surgeon: Otis Brace, MD;  Location: [place];  Service: Gastroenterology;;   BUBBLE STUDY  07/29/2021   Procedure: BUBBLE STUDY;  Surgeon: Jerline Pain, MD;  Location: [place] [facility];  Service: Cardiovascular;;   ESOPHAGOGASTRODUODENOSCOPY (EGD) WITH PROPOFOL N/A 08/10/2021   Procedure: ESOPHAGOGASTRODUODENOSCOPY (EGD) WITH PROPOFOL;  Surgeon: Otis Brace, MD;  Location: [place];  Service: Gastroenterology;  Laterality: N/A;   ESOPHAGOGASTRODUODENOSCOPY (EGD) WITH PROPOFOL N/A 08/24/2021   Procedure: ESOPHAGOGASTRODUODENOSCOPY (EGD) WITH PROPOFOL;  Surgeon: Otis Brace, MD;  Location: [place];  Service: Gastroenterology;  Laterality: N/A;   HERNIA REPAIR     IR FLUORO GUIDED NEEDLE PLC ASPIRATION/INJECTION LOC  07/28/2021   LUMBAR LAMINECTOMY/DECOMPRESSION MICRODISCECTOMY N/A 08/07/2021   Procedure: THORACIC FOUR - THORACIC FIVE LAMINECTOMY/DECOMPRESSION OF SPINAL CORD, DEBRIDEMENT OF ABSCESS, MICRODISCECTOMY, INTRAOPERATIVE ULTRASOUND;  Surgeon: Consuella Lose, MD;  Location: [place];  Service: Neurosurgery;  Laterality: N/A;   TEE WITHOUT CARDIOVERSION N/A 07/29/2021   Procedure: TRANSESOPHAGEAL ECHOCARDIOGRAM (TEE);  Surgeon: Jerline Pain, MD;  Location[facility] ENDOSCOPY;   Service: Cardiovascular;  Laterality: N/A;   TRIGGER FINGER RELEASE Right 10/25/2019   Procedure: RIGHT INDEX FINGER RELEASE TRIGGER FINGER/A-1 PULLEY;  Surgeon: Daryll Brod, MD;  Location: [place];  Service: Orthopedics;  Laterality: Right;  IV REGIONAL FOREARM BLOCK   Social History:  reports that he quit smoking about 22 months ago. His smoking use included cigarettes. He has a 20.00 pack-year smoking history. He has never used smokeless tobacco. He reports that he does not currently use alcohol. He reports that he does not use drugs.  Allergies  Allergen Reactions   Cranberry Itching   Hm Lidocaine Patch [Lidocaine] Dermatitis    Blisters skin    Melatonin Other (See Comments)    nightmares    Family History  Problem Relation Age of Onset   Diabetes Mother    Hyperlipidemia Mother    Stroke Mother    Diabetes Father    Hyperlipidemia Brother    Stroke Brother    ADD / ADHD Brother    ADD / ADHD Son     Prior to Admission medications   Medication Sig Start Date End Date Taking? Authorizing Provider  acetaminophen (TYLENOL) 325 MG tablet 1 capsule as needed    [provider]  albuterol (VENTOLIN HFA) 108 (90 Base) MCG/ACT inhaler Inhale 1 puff into the lungs every 4 (four) hours as needed for wheezing or shortness of breath. 05/27/21   [provider]  ALPRAZolam Duanne Moron) 0.25 MG tablet Take 1 tablet (0.25 mg total) by mouth 2 (two) times daily as needed for anxiety. 09/21/21   Love, Ivan Anchors, PA-C  amLODipine (NORVASC) 5 MG tablet Take 5 mg by mouth daily. 10/29/21   [provider]  atorvastatin (LIPITOR) 40 MG tablet Take 1 tablet by mouth daily.    [provider]  bethanechol (URECHOLINE) 10 MG tablet Take 1 tablet (10 mg total) by mouth 3 (three) times daily. 09/21/21   Love, Ivan Anchors, PA-C  bisacodyl (DULCOLAX) 10 MG suppository Place 1 suppository (10 mg total) rectally daily as needed for moderate constipation. 08/29/21    Terrilee Croak, MD  cephALEXin (KEFLEX) 500 MG capsule Take 500 mg by mouth 3 (three) times daily. Patient not taking: Reported on 04/29/2022 12/10/21   [provider]  cholecalciferol (VITAMIN D3) 25 MCG (1000 UNIT) tablet 1 tablet    [provider]  collagenase (SANTYL) ointment Apply topically daily. 09/23/21   Love, Ivan Anchors, PA-C  Continuous Blood Gluc Sensor (FREESTYLE LIBRE 3 SENSOR) MISC 1 Device by Does not apply route every 14 (fourteen) days. Apply 1 sensor on upper arm every 14 days for continuous glucose monitoring 04/29/22   Elayne Snare, MD  furosemide (LASIX) 20 MG tablet Take 20 mg by mouth daily. 12/10/21   [provider]  gabapentin (NEURONTIN) 100 MG capsule Take 2 capsules (200 mg total) by mouth 2 (two) times daily. 09/21/21   Love, Ivan Anchors, PA-C  HYDROcodone-acetaminophen (NORCO) 5-325 MG tablet Take 1-2 tablets by mouth every 6 (six) hours as needed for severe pain. 01/01/22   Lovorn, Jinny Blossom, MD  hydrOXYzine (ATARAX) 25 MG tablet Take 1 tablet (25 mg total) by mouth 3 (three) times daily as needed for anxiety or nausea. 01/13/22   Lovorn, Jinny Blossom, MD  insulin glargine, 1 Unit Dial, (TOUJEO SOLOSTAR) 300 UNIT/ML Solostar Pen Inject  20 Units into the skin daily. 04/29/22   Elayne Snare, MD  insulin lispro (HUMALOG KWIKPEN) 100 UNIT/ML KwikPen 8-14 U ac tid 04/29/22   Elayne Snare, MD  Insulin Pen Needle (PENTIPS) 32G X 4 MM MISC use as directed 09/21/21   Love, Ivan Anchors, PA-C  methocarbamol (ROBAXIN) 500 MG tablet Take 1 tablet (500 mg total) by mouth every 6 (six) hours as needed for muscle spasms. 09/21/21   Love, Ivan Anchors, PA-C  metoprolol tartrate (LOPRESSOR) 50 MG tablet Take 1 tablet (50 mg total) by mouth 2 (two) times daily. 09/21/21   Love, Ivan Anchors, PA-C  mirtazapine (REMERON) 15 MG tablet Take 1 tablet (15 mg total) by mouth at bedtime. 09/21/21   Love, Ivan Anchors, PA-C  morphine (MS CONTIN) 15 MG 12 hr tablet Take 1 tablet (15 mg total) by mouth daily. Do  Not Fill Before 10/25/2021 01/13/22   Courtney Heys, MD  Multiple Vitamin (MULTIVITAMIN ADULT PO) Take 1 tablet by mouth daily.    [provider]  naloxone [facility]) nasal spray 4 mg/0.1 mL Use as needed for signs of overdose. 09/21/21   Love, Ivan Anchors, PA-C  nutrition supplement, JUVEN, (JUVEN) PACK Take 1 packet by mouth 2 (two) times daily between meals. 09/21/21   Love, Ivan Anchors, PA-C  ondansetron (ZOFRAN-ODT) 4 MG disintegrating tablet Take 4 mg by mouth daily. 12/28/21   [provider]  oxyCODONE-acetaminophen (PERCOCET) 10-325 MG tablet Take 1 tablet by mouth every 6 (six) hours as needed for pain. Can fill 09/28/21 09/22/21   Lovorn, Jinny Blossom, MD  pantoprazole (PROTONIX) 40 MG tablet Take 1 tablet (40 mg total) by mouth 2 (two) times daily. 09/21/21   Love, Ivan Anchors, PA-C  PARoxetine (PAXIL) 20 MG tablet Take 1 tablet (20 mg total) by mouth at bedtime. 09/21/21   Love, Ivan Anchors, PA-C  polyethylene glycol (MIRALAX / GLYCOLAX) 17 g packet Take 17 g by mouth daily as needed for moderate constipation, mild constipation or severe constipation. 08/29/21   Dahal, Marlowe Aschoff, MD  senna-docusate (SENOKOT-S) 8.6-50 MG tablet Take 2 tablets by mouth at bedtime as needed for mild constipation. 09/21/21   Love, Ivan Anchors, PA-C  sucralfate (CARAFATE) 1 GM/10ML suspension Take 10 mLs (1 g total) by mouth 4 (four) times daily -  with meals and at bedtime. 09/21/21   Love, Ivan Anchors, PA-C  tamsulosin (FLOMAX) 0.4 MG CAPS capsule Take 1 capsule (0.4 mg total) by mouth daily after supper. 09/21/21   Love, Ivan Anchors, PA-C  tirzepatide [facility]) 5 MG/0.5ML Pen Inject 5 mg into the skin once a week. 04/30/22   Elayne Snare, MD  tobramycin (TOBREX) 0.3 % ophthalmic solution SMARTSIG:In Eye(s) 11/03/21   [provider]    Physical Exam: Vitals:   06/03/22 0518 06/03/22 0530 06/03/22 0600 06/03/22 0630  BP: 124/89 (!) 149/94 (!) 183/100 (!) 192/108  Pulse: (!) 107 (!) 103 (!) 109 (!) 112  Resp: 18 18  15 15   Temp: 98.6 F (37 C)     TempSrc: Oral     SpO2: 95% 94% 91% 97%  Weight:      Height:        Constitutional: Middle-age male who appears to be in some discomfort Eyes: PERRL, lids and conjunctivae normal ENMT: Mucous membranes are moist.   Neck: normal, supple,   Respiratory: clear to auscultation bilaterally, no wheezing, no crackles. Normal respiratory effort.  Patient on 2 L of nasal cannula oxygen. Cardiovascular: Tachycardic.  No murmur appreciated.  No lower extremity edema. Abdomen: no tenderness, no masses palpated.  . Bowel sounds positive.  Musculoskeletal: no clubbing / cyanosis.  Healed prior surgical scar of the thoracic spine. Skin: no rashes, lesions, ulcers. No induration Neurologic: CN 2-12 grossly intact.  Tremor present.  Able to move all extremities Psychiatric: Alert and oriented x 3.  Data Reviewed:  Reviewed labs imaging and pertinent records as noted above.  Assessment and Plan: SIRS  possible recurrent discitis/osteomyelitis of thoracic spine Acute.  Patient presented with tachycardia, tachypnea, and white blood cell count elevated up to 17.2 meeting SIRS criteria.  MRI concerning for sequela of prior discitis/osteomyelitis of T4-5 with marrow edema that appear to be more so chronic diarrhea current infection.  Lactic acid was reassuring, but patient was noted to have acute kidney injury to suggest endorgan damage.  CRP 0.8 and sed rate 72. Blood cultures have been obtained and patient was started empirically on empiric antibiotics of vancomycin, metronidazole, and cefepime.  -Admit to a progressive bed -Follow-up blood cultures -Continue empiric antibiotics of vancomycin and cefepime -Oxycodone/Dilaudid IV as needed for moderate to severe pain respectively -Tylenol as needed for fever -ID consulted, will follow-up for any further recommendation  Hypertensive urgency Acute.On admission blood pressures had initially been seen elevated up to  220/124.  Home blood pressure regimen includes metoprolol 50 mg twice daily, furosemide 20 mg daily, and amlodipine 5 mg daily. -Continue metoprolol and amlodipine -Held furosemide due to AKI -Hydralazine IV as needed for elevated blood pressures  Acute kidney injury superimposed on chronic kidney disease stage IIIb Patient presented with creatinine elevated up to 3.78 with BUN 51.  Baseline creatinine previously had been around 2.  Likely secondary to dehydration in setting of poor p.o. intake.  Patient had been given 3 L bolus of IV fluids in the ED. -Continue IV fluids -Continue to monitor kidney function daily  Nausea and vomiting Patient reported having nausea and vomiting for which she was unable to tolerate any food or liquids over the last 4 days. -Antiemetics as needed  Uncontrolled diabetes mellitus type 2 with hyperglycemia, on long-term use of insulin On admission glucose elevated to 258, but without elevated anion gap.  Last hemoglobin A1c 11.4 on 02/18/2022.  Home regimen appears to include 70/30 insulin 20 units daily with breakfast and he has not started tirzepatide 5 mg inject weekly. -Hypoglycemic protocols -Pharmacy substitution of Semglee 15 units daily. -CBGs before every meal with moderate SSI -Adjust regimen as needed  Anemia of chronic kidney disease Hemoglobin 10.5 g/dL.  No reports of bleeding. -Continue to monitor  Anxiety  -Continue home regimen of Paxil and hydroxyzine   DVT prophylaxis: Lovenox Advance Care Planning:   Code Status: Full Code    Consults: ID  Family Communication:   Severity of Illness: The appropriate patient status for this patient is INPATIENT. Inpatient status is judged to be reasonable and necessary in order to provide the required intensity of service to ensure the patient's safety. The patient's presenting symptoms, physical exam findings, and initial radiographic and laboratory data in the context of their chronic comorbidities  is felt to place them at high risk for further clinical deterioration. Furthermore, it is not anticipated that the patient will be medically stable for discharge from the hospital within 2 midnights of admission.   * I certify that at the point of admission it is my clinical judgment that the patient will require inpatient hospital care spanning beyond 2 midnights from the point of admission  due to high intensity of service, high risk for further deterioration and high frequency of surveillance required.*  Author: Norval Morton, MD 06/03/2022 10:06 AM  For on call review [email or web address].

## 2022-06-03 NOTE — Progress Notes (Signed)
  Transition of Care [facility]) Screening Note   Patient Details  Name: Jose [name] Date of Birth: 04/24/1976   [facility]) CM/SW Contact:    Cyndi Bender, RN Phone Number: 06/03/2022, 9:56 AM    Transition of Care Department [facility]) has reviewed patient and no TOC needs have been identified at this time. We will continue to monitor patient advancement through interdisciplinary progression rounds. If new patient transition needs arise, please place a TOC consult.

## 2022-06-03 NOTE — ED Notes (Signed)
Pt returned from MRI, [facility] at bedside for transport

## 2022-06-03 NOTE — ED Notes (Signed)
Pt transferred to MRI

## 2022-06-03 NOTE — Consult Note (Signed)
[facility]                                                                                        Patient Identification: Patient Name: Jose [name] MRN: [id number] [place] Date: 06/02/2022  1:10 PM Today's Date: 06/03/2022 Reason for consult: concern for vertebral infection  Requesting provider: Fuller Plan   Principal Problem:   SIRS (systemic inflammatory response syndrome) (HCC)   Antibiotics:  Vancomycin 8/9  Cefepime 8/9 Metronidazole 8/9  Lines/Hardware:  Assessment 46 Y O male with PMH of HTN, HLD, DM2, CKD, Thoracic discitis and osteomyelitis s/p completion of prolonged IV and PO abtx and off abtx since 02/09/22 who presented to ED on 8/10 with feeling unwell for last 4 days. Hypertensive on presentation. MRI pan sine with sequelae of prior discitis and osteomyelitis with no concerns for new infection. ESR downtrending and CRP normal. Leukocytosis likely reactive in the setting of nausea/vomiting   Recommendations  Ok to stop current antibiotics if blood cx 8/9 are negative in 48 hrs Monitor off antibiotics   Rest of the management as per the primary team. Please call with questions or concerns.  Thank you for the consult  Rosiland Oz, MD Infectious Disease Physician [facility] [address] [place], [place] [address] Phone: [phone]  Fax: [phone]  __________________________________________________________________________________________________________ HPI and Hospital Course: 46 Y O male with PMH of HTN, HLD, DM2, CKD, GAD, MSSA Thoracic, coccygeal discitis and osteomyelitis s/p completion of prolonged IV and PO abtx and off abtx since 4/18/23who presented to ED on 8/10 with feeling unwell for last 4 days. He has had nausea, NBNB vomiting since Monday and has not been able to keep anything down his mouth.  Also had subjective fevers and chills. Back pain  has been stable around 3-4/10. He also reported mild tremor and unsteady gait due to neuropathy, blurry vision due to diabetic retinopathy. Denies abdominal pain and diarrhea. Mild cough with clear phlegm but denies SOB and chest pain. Denies GU symptoms and rashes/joint pain   At ED, afebrile. WBC up to 17.2 Labs are remarkable Cr 3.78 Blood cx and urine cx 8/9 are pending   ROS: all systems reviewed with pertinent positives and negatives as listed above  Past Medical History:  Diagnosis Date   AKI (acute kidney injury) ([place])    GAD (generalized anxiety disorder)    Hyperlipidemia    Macular degeneration, bilateral    Retinopathy    Type II diabetes mellitus with complication, uncontrolled    retinopathy, neuropathy, microalbuminuria   Past Surgical History:  Procedure Laterality Date   APPENDECTOMY     BIOPSY  08/10/2021   Procedure: BIOPSY;  Surgeon: Otis Brace, MD;  Location: [place];  Service: Gastroenterology;;   BUBBLE STUDY  07/29/2021   Procedure: BUBBLE STUDY;  Surgeon: Jerline Pain, MD;  Location: [place];  Service: Cardiovascular;;   ESOPHAGOGASTRODUODENOSCOPY (EGD) WITH PROPOFOL N/A 08/10/2021   Procedure: ESOPHAGOGASTRODUODENOSCOPY (EGD) WITH PROPOFOL;  Surgeon: Otis Brace, MD;  Location: [place];  Service: Gastroenterology;  Laterality: N/A;   ESOPHAGOGASTRODUODENOSCOPY (EGD) WITH  PROPOFOL N/A 08/24/2021   Procedure: ESOPHAGOGASTRODUODENOSCOPY (EGD) WITH PROPOFOL;  Surgeon: Otis Brace, MD;  Location: [facility];  Service: Gastroenterology;  Laterality: N/A;   HERNIA REPAIR     IR FLUORO GUIDED NEEDLE PLC ASPIRATION/INJECTION LOC  07/28/2021   LUMBAR LAMINECTOMY/DECOMPRESSION MICRODISCECTOMY N/A 08/07/2021   Procedure: THORACIC FOUR - THORACIC FIVE LAMINECTOMY/DECOMPRESSION OF SPINAL CORD, DEBRIDEMENT OF ABSCESS, MICRODISCECTOMY, INTRAOPERATIVE ULTRASOUND;  Surgeon: Consuella Lose, MD;  Location: [place];  Service: Neurosurgery;  Laterality: N/A;   TEE WITHOUT CARDIOVERSION N/A 07/29/2021   Procedure: TRANSESOPHAGEAL ECHOCARDIOGRAM (TEE);  Surgeon: Jerline Pain, MD;  Location: [facility] ENDOSCOPY;  Service: Cardiovascular;  Laterality: N/A;   TRIGGER FINGER RELEASE Right 10/25/2019   Procedure: RIGHT INDEX FINGER RELEASE TRIGGER FINGER/A-1 PULLEY;  Surgeon: Daryll Brod, MD;  Location: [place];  Service: Orthopedics;  Laterality: Right;  IV REGIONAL FOREARM BLOCK     Scheduled Meds:  [START ON 06/04/2022] amLODipine  5 mg Oral Daily   heparin  5,000 Units Subcutaneous Q8H   insulin aspart  0-15 Units Subcutaneous TID WC   insulin aspart  0-5 Units Subcutaneous QHS   insulin glargine-yfgn  15 Units Subcutaneous Daily   metoprolol tartrate  50 mg Oral BID   ondansetron (ZOFRAN) IV  4 mg Intravenous Q6H   sodium chloride flush  3 mL Intravenous Q12H   vancomycin variable dose per unstable renal function (pharmacist dosing)   Does not apply See admin instructions   Continuous Infusions:  sodium chloride 10 mL/hr at 06/03/22 1420   ceFEPime (MAXIPIME) IV     PRN Meds:.sodium chloride, acetaminophen **OR** acetaminophen, albuterol, hydrALAZINE, HYDROmorphone (DILAUDID) injection, LORazepam, ondansetron **OR** ondansetron (ZOFRAN) IV, mouth rinse, oxyCODONE-acetaminophen  Allergies  Allergen Reactions   Cranberry Itching   Hm Lidocaine Patch [Lidocaine] Dermatitis    Blisters skin    Melatonin Other (See Comments)    nightmares   Nsaids Other (See Comments)    Stage 3 kidney disease   Social History   Socioeconomic History   Marital status: Married    Spouse name: Not on file   Number of children: 2   Years of education: Not on file   Highest education level: Bachelor's degree (e.g., BA, AB, BS)  Occupational History   Occupation: Multimedia programmer: [employer]  Tobacco Use   Smoking status: Former    Packs/day:  1.00    Years: 20.00    Total pack years: 20.00    Types: Cigarettes    Quit date: 07/2020    Years since quitting: 1.8   Smokeless tobacco: Never  Vaping Use   Vaping Use: Never used  Substance and Sexual Activity   Alcohol use: Not Currently   Drug use: Never   Sexual activity: Yes    Partners: Female    Birth control/protection: None    Comment: with monogamous partner  Other Topics Concern   Not on file  Social History Narrative   Pt has lived 13 of life in [place]. Lives at home with partner, 2 kids, 8 cats, and 1 dog.    Social Determinants of Health   Financial Resource Strain: Low Risk  (05/05/2018)   Overall Financial Resource Strain (CARDIA)    Difficulty of Paying Living Expenses: Not hard at all  Food Insecurity: No Food Insecurity (05/05/2018)   Hunger Vital Sign    Worried About Running Out of Food in the Last Year: Never true    Ran Out of Food in  the Last Year: Never true  Transportation Needs: No Transportation Needs (05/05/2018)   PRAPARE - Hydrologist (Medical): No    Lack of Transportation (Non-Medical): No  Physical Activity: Insufficiently Active (05/05/2018)   Exercise Vital Sign    Days of Exercise per Week: 4 days    Minutes of Exercise per Session: 20 min  Stress: Not on file  Social Connections: Moderately Isolated (05/05/2018)   Social Connection and Isolation Panel [NHANES]    Frequency of Communication with Friends and Family: Once a week    Frequency of Social Gatherings with Friends and Family: Once a week    Attends Religious Services: Never    Marine scientist or Organizations: No    Attends Archivist Meetings: Never    Marital Status: Living with partner  Intimate Partner Violence: Not At Risk (05/05/2018)   Humiliation, Afraid, Rape, and Kick questionnaire    Fear of Current or Ex-Partner: No    Emotionally Abused: No    Physically Abused: No    Sexually Abused: No   Family  History  Problem Relation Age of Onset   Diabetes Mother    Hyperlipidemia Mother    Stroke Mother    Diabetes Father    Hyperlipidemia Brother    Stroke Brother    ADD / ADHD Brother    ADD / ADHD Son   ' Vitals BP (!) 178/91 (BP Location: Right Arm)   Pulse 94   Temp 98.3 F (36.8 C) (Oral)   Resp 20   Ht _0  (1.803 m)   Wt 95.3 kg   SpO2 95%   BMI 29.29 kg/m    Physical Exam Constitutional:  sitting up in the bed and appears restless    Comments: BP is high   Cardiovascular:     Rate and Rhythm: Normal rate and regular rhythm.     Heart sounds:   Pulmonary:     Effort: Pulmonary effort is normal on room air    Comments: Normal breath sounds   Abdominal:     Palpations: Abdomen is soft.     Tenderness: non distended and non tender  Musculoskeletal:        General: No swelling or tenderness in peripheral joints, Posterior thoracic surgical wound has healed, No spinal tenderness  Skin:    Comments:   Neurological:     General: grossly non focal, awake, alert and oriented   Psychiatric:        Mood and Affect: Mood normal.    Pertinent Microbiology Results for orders placed or performed during the hospital encounter of 06/02/22  SARS Coronavirus 2 by RT PCR (hospital order, performed in [facility] hospital lab) *cepheid single result test* Anterior Nasal Swab     Status: None   Collection Time: 06/02/22 12:13 PM   Specimen: Anterior Nasal Swab  Result Value Ref Range Status   SARS Coronavirus 2 by RT PCR NEGATIVE NEGATIVE Final    Comment: (NOTE) SARS-CoV-2 target nucleic acids are NOT DETECTED.  The SARS-CoV-2 RNA is generally detectable in upper and lower respiratory specimens during the acute phase of infection. The lowest concentration of SARS-CoV-2 viral copies this assay can detect is 250 copies / mL. A negative result does not preclude SARS-CoV-2 infection and should not be used as the sole basis for treatment or other patient management  decisions.  A negative result may occur with improper specimen collection / handling, submission of specimen other  than nasopharyngeal swab, presence of viral mutation(s) within the areas targeted by this assay, and inadequate number of viral copies (<250 copies / mL). A negative result must be combined with clinical observations, patient history, and epidemiological information.  Fact Sheet for Patients:   https://www.patel.info/  Fact Sheet for Healthcare Providers: https://hall.com/  This test is not yet approved or  cleared by the [place] FDA and has been authorized for detection and/or diagnosis of SARS-CoV-2 by FDA under an Emergency Use Authorization (EUA).  This EUA will remain in effect (meaning this test can be used) for the duration of the COVID-19 declaration under Section 564(b)(1) of the Act, 21 U.S.C. section 360bbb-3(b)(1), unless the authorization is terminated or revoked sooner.  Performed at [facility], [address] [place], [place] [address]   Resp Panel by RT-PCR (Flu A&B, Covid) Anterior Nasal Swab     Status: None   Collection Time: 06/02/22  3:18 PM   Specimen: Anterior Nasal Swab  Result Value Ref Range Status   SARS Coronavirus 2 by RT PCR NEGATIVE NEGATIVE Final    Comment: (NOTE) SARS-CoV-2 target nucleic acids are NOT DETECTED.  The SARS-CoV-2 RNA is generally detectable in upper respiratory specimens during the acute phase of infection. The lowest concentration of SARS-CoV-2 viral copies this assay can detect is 138 copies/mL. A negative result does not preclude SARS-Cov-2 infection and should not be used as the sole basis for treatment or other patient management decisions. A negative result may occur with  improper specimen collection/handling, submission of specimen other than nasopharyngeal swab, presence of viral mutation(s) within the areas targeted by this assay, and  inadequate number of viral copies(<138 copies/mL). A negative result must be combined with clinical observations, patient history, and epidemiological information. The expected result is Negative.  Fact Sheet for Patients:  EntrepreneurPulse.com.au  Fact Sheet for Healthcare Providers:  IncredibleEmployment.be  This test is no t yet approved or cleared by the [place] FDA and  has been authorized for detection and/or diagnosis of SARS-CoV-2 by FDA under an Emergency Use Authorization (EUA). This EUA will remain  in effect (meaning this test can be used) for the duration of the COVID-19 declaration under Section 564(b)(1) of the Act, 21 U.S.C.section 360bbb-3(b)(1), unless the authorization is terminated  or revoked sooner.       Influenza A by PCR NEGATIVE NEGATIVE Final   Influenza B by PCR NEGATIVE NEGATIVE Final    Comment: (NOTE) The Xpert Xpress SARS-CoV-2/FLU/RSV plus assay is intended as an aid in the diagnosis of influenza from Nasopharyngeal swab specimens and should not be used as a sole basis for treatment. Nasal washings and aspirates are unacceptable for Xpert Xpress SARS-CoV-2/FLU/RSV testing.  Fact Sheet for Patients: EntrepreneurPulse.com.au  Fact Sheet for Healthcare Providers: IncredibleEmployment.be  This test is not yet approved or cleared by the [place] FDA and has been authorized for detection and/or diagnosis of SARS-CoV-2 by FDA under an Emergency Use Authorization (EUA). This EUA will remain in effect (meaning this test can be used) for the duration of the COVID-19 declaration under Section 564(b)(1) of the Act, 21 U.S.C. section 360bbb-3(b)(1), unless the authorization is terminated or revoked.  Performed at [facility], [address], [place], [place] [address]   Blood Culture (routine x 2)     Status: None (Preliminary result)   Collection  Time: 06/02/22  3:18 PM   Specimen: BLOOD RIGHT HAND  Result Value Ref Range Status   Specimen Description  Final    BLOOD RIGHT HAND Performed at [facility], [address] [place], [place] [address]    Special Requests   Final    BOTTLES DRAWN AEROBIC AND ANAEROBIC Blood Culture adequate volume Performed at [facility], [address] [place], [place] [address]    Culture   Final    NO GROWTH < 12 HOURS Performed at [facility], [place] [address], [place], [place] [address]    Report Status PENDING  Incomplete  Blood Culture (routine x 2)     Status: None (Preliminary result)   Collection Time: 06/02/22  3:56 PM   Specimen: BLOOD  Result Value Ref Range Status   Specimen Description   Final    BLOOD BLOOD LEFT ARM Performed at [facility], [address] [place], [place] [address]    Special Requests   Final    Blood Culture adequate volume BOTTLES DRAWN AEROBIC AND ANAEROBIC Performed at [facility], [address] [place], [place] [address]    Culture  Setup Time NO ORGANISMS SEEN  Final   Culture   Final    NO GROWTH < 12 HOURS Performed at [facility], [place] [address] [place], [place] [address]    Report Status PENDING  Incomplete     Pertinent Lab seen by me:    Latest Ref Rng & Units 06/02/2022   12:12 PM 12/18/2021    2:07 PM 09/21/2021    3:18 AM  CBC  WBC 4.0 - 10.5 K/uL 17.2  9.2  7.5   Hemoglobin 13.0 - 17.0 g/dL 10.5  8.6  9.8   Hematocrit 39.0 - 52.0 % 29.9  25.7  31.5   Platelets 150 - 400 K/uL 298  287  304       Latest Ref Rng & Units 06/03/2022   11:20 AM 06/02/2022   12:12 PM 04/23/2022    3:02 PM  CMP  Glucose 70 - 99 mg/dL 191  258  72   BUN 6 - 20 mg/dL 41  51    Creatinine 0.61 - 1.24 mg/dL 3.33  3.78    Sodium 135 - 145 mmol/L 139  137    Potassium 3.5 - 5.1 mmol/L 4.5  4.9    Chloride 98 - 111 mmol/L 107  101    CO2 22 - 32 mmol/L 24  21     Calcium 8.9 - 10.3 mg/dL 8.7  8.9    Total Protein 6.5 - 8.1 g/dL  7.5    Total Bilirubin 0.3 - 1.2 mg/dL  0.7    Alkaline Phos 38 - 126 U/L  138    AST 15 - 41 U/L  16    ALT 0 - 44 U/L  10    '  Pertinent Imagings/Other Imagings Plain films and CT images have been personally visualized and interpreted; radiology reports have been reviewed. Decision making incorporated into the Impression / Recommendations.  MR THORACIC SPINE W WO CONTRAST  Result Date: 06/03/2022 CLINICAL DATA:  Upper back pain, history of osteomyelitis EXAM: MRI CERVICAL AND THORACIC SPINE WITHOUT AND WITH CONTRAST TECHNIQUE: Multiplanar and multiecho pulse sequences of the cervical spine, to include the craniocervical junction and cervicothoracic junction, and the thoracic spine, were obtained without and with intravenous contrast. CONTRAST:  9.64m GADAVIST GADOBUTROL 1 MMOL/ML IV SOLN COMPARISON:  Thoracic spine MRI 10/27/2021 FINDINGS: MRI CERVICAL SPINE Alignment: Mild retrolisthesis at C4-C5. Vertebrae: Minor degenerative endplate irregularity. No marrow edema.  No suspicious osseous lesion. In Cord: No abnormal signal.  No abnormal intrathecal enhancement. Posterior Fossa, vertebral arteries, paraspinal tissues: Unremarkable. Disc levels: C2-C3:  No canal or foraminal stenosis. C3-C4: Disc bulge with endplate osteophytes. Uncovertebral hypertrophy. Mild canal stenosis. Mild foraminal stenosis. C4-C5: Disc bulge with superimposed central protrusion endplate osteophytes. Moderate canal stenosis. No foraminal stenosis. C5-C6: Disc bulge with superimposed left foraminal protrusion and endplate osteophytes. Uncovertebral hypertrophy. Moderate canal stenosis. No right foraminal stenosis. Moderate to marked left foraminal stenosis. C6-C7:  Disc bulge.  No canal or foraminal stenosis. C7-T1:  No canal or foraminal stenosis. MRI THORACIC SPINE Alignment:  Stable. Vertebrae: Sequelae of discitis/osteomyelitis at T4-T5 with severe T5  collapse and partial fusion across the disc space. Persistent but substantially decreased marrow edema and enhancement. Postoperative changes are present at this level with susceptibility artifact and posterior decompression. There remains canal stenosis due to osseous retropulsion with possible cord deformity. Cord:  As above.  Otherwise unremarkable. Paraspinal and other soft tissues: Chronic postoperative changes. Disc levels: No new degenerative canal or foraminal stenosis. IMPRESSION: Sequelae of discitis/osteomyelitis at T4-T5 with persistent but substantially decreased marrow edema and enhancement. No paraspinal edema or enhancement. Burtis Junes this represents chronic changes rather than recurrent disease. There is likely similar canal stenosis at this level due to osseous retropulsion. No evidence of discitis/osteomyelitis elsewhere. Cervical spine degenerative changes as detailed above. Electronically Signed   By: Macy Mis M.D.   On: 06/03/2022 09:39   MR Cervical Spine W or Wo Contrast  Result Date: 06/03/2022 CLINICAL DATA:  Upper back pain, history of osteomyelitis EXAM: MRI CERVICAL AND THORACIC SPINE WITHOUT AND WITH CONTRAST TECHNIQUE: Multiplanar and multiecho pulse sequences of the cervical spine, to include the craniocervical junction and cervicothoracic junction, and the thoracic spine, were obtained without and with intravenous contrast. CONTRAST:  9.33m GADAVIST GADOBUTROL 1 MMOL/ML IV SOLN COMPARISON:  Thoracic spine MRI 10/27/2021 FINDINGS: MRI CERVICAL SPINE Alignment: Mild retrolisthesis at C4-C5. Vertebrae: Minor degenerative endplate irregularity. No marrow edema. No suspicious osseous lesion. In Cord: No abnormal signal.  No abnormal intrathecal enhancement. Posterior Fossa, vertebral arteries, paraspinal tissues: Unremarkable. Disc levels: C2-C3:  No canal or foraminal stenosis. C3-C4: Disc bulge with endplate osteophytes. Uncovertebral hypertrophy. Mild canal stenosis. Mild  foraminal stenosis. C4-C5: Disc bulge with superimposed central protrusion endplate osteophytes. Moderate canal stenosis. No foraminal stenosis. C5-C6: Disc bulge with superimposed left foraminal protrusion and endplate osteophytes. Uncovertebral hypertrophy. Moderate canal stenosis. No right foraminal stenosis. Moderate to marked left foraminal stenosis. C6-C7:  Disc bulge.  No canal or foraminal stenosis. C7-T1:  No canal or foraminal stenosis. MRI THORACIC SPINE Alignment:  Stable. Vertebrae: Sequelae of discitis/osteomyelitis at T4-T5 with severe T5 collapse and partial fusion across the disc space. Persistent but substantially decreased marrow edema and enhancement. Postoperative changes are present at this level with susceptibility artifact and posterior decompression. There remains canal stenosis due to osseous retropulsion with possible cord deformity. Cord:  As above.  Otherwise unremarkable. Paraspinal and other soft tissues: Chronic postoperative changes. Disc levels: No new degenerative canal or foraminal stenosis. IMPRESSION: Sequelae of discitis/osteomyelitis at T4-T5 with persistent but substantially decreased marrow edema and enhancement. No paraspinal edema or enhancement. FBurtis Junesthis represents chronic changes rather than recurrent disease. There is likely similar canal stenosis at this level due to osseous retropulsion. No evidence of discitis/osteomyelitis elsewhere. Cervical spine degenerative changes as detailed above. Electronically Signed   By: PMacy MisM.D.   On: 06/03/2022 09:39   MR Lumbar Spine  W Wo Contrast  Result Date: 06/03/2022 CLINICAL DATA:  Back pain, nausea and vomiting. History of treated osteomyelitis. EXAM: MRI LUMBAR SPINE WITHOUT AND WITH CONTRAST TECHNIQUE: Multiplanar and multiecho pulse sequences of the lumbar spine were obtained without and with intravenous contrast. CONTRAST:  9.66m GADAVIST GADOBUTROL 1 MMOL/ML IV SOLN COMPARISON:  07/23/2021 FINDINGS:  Segmentation: There are five lumbar type vertebral bodies. The last full intervertebral disc space is labeled L5-S1. This correlates with the prior MRI examination. Alignment:  Normal Vertebrae: Normal marrow signal. No bone lesions or fractures. No findings to suggest discitis or osteomyelitis in the lumbar spine. The facets are normally aligned. No pars defects or evidence of septic arthritis involving the facet joints. Conus medullaris and cauda equina: Conus extends to the L1-2 level. Conus and cauda equina appear normal. Paraspinal and other soft tissues: No significant paraspinal or retroperitoneal findings. No inflammation/edema or abscess in the psoas muscles. Disc levels: No lumbar disc protrusions, spinal foraminal stenosis. IMPRESSION: Unremarkable lumbar spine MRI examination. No findings to suggest discitis or osteomyelitis. Electronically Signed   By: PMarijo SanesM.D.   On: 06/03/2022 09:34   CT Head Wo Contrast  Result Date: 06/02/2022 CLINICAL DATA:  Headache, new or worsening (Age >= 50y) EXAM: CT HEAD WITHOUT CONTRAST TECHNIQUE: Contiguous axial images were obtained from the base of the skull through the vertex without intravenous contrast. RADIATION DOSE REDUCTION: This exam was performed according to the departmental dose-optimization program which includes automated exposure control, adjustment of the mA and/or kV according to patient size and/or use of iterative reconstruction technique. COMPARISON:  CT head 08/11/2021 FINDINGS: Brain: No evidence of large-territorial acute infarction. No parenchymal hemorrhage. No mass lesion. No extra-axial collection. No mass effect or midline shift. No hydrocephalus. Basilar cisterns are patent. Vascular: No hyperdense vessel. Skull: No acute fracture or focal lesion. Sinuses/Orbits: Paranasal sinuses and mastoid air cells are clear. The orbits are unremarkable. Other: None. IMPRESSION: No acute intracranial abnormality. Electronically Signed   By:  MIven FinnM.D.   On: 06/02/2022 20:04   DG Chest 1 View  Result Date: 06/02/2022 CLINICAL DATA:  Cough, vomiting EXAM: CHEST  1 VIEW COMPARISON:  09/07/2021 FINDINGS: Transverse diameter of the heart is slightly increased. There are no signs of pulmonary edema or focal pulmonary consolidation. There is no pleural effusion or pneumothorax. IMPRESSION: No active disease. Electronically Signed   By: PElmer PickerM.D.   On: 06/02/2022 12:39    I spent 110 minutes for this patient encounter including review of prior medical records/discussing diagnostics and treatment plan with the patient/family/coordinate care with primary/other specialits with greater than 50% of time in face to face encounter.   Electronically signed by:   SRosiland Oz MD Infectious Disease Physician [facility] Pager: [phone]

## 2022-06-03 NOTE — Inpatient Diabetes Management (Signed)
Inpatient Diabetes Program Recommendations  AACE/ADA: New Consensus Statement on Inpatient Glycemic Control (2015)  Target Ranges:  Prepandial:   less than 140 mg/dL      Peak postprandial:   less than 180 mg/dL (1-2 hours)      Critically ill patients:  140 - 180 mg/dL   Lab Results  Component Value Date   GLUCAP 182 (H) 06/03/2022   HGBA1C 11.4 (A) 02/18/2022    Review of Glycemic Control  Diabetes history: DM2 Outpatient Diabetes medications: Toujeo 20 QD, Humalog 8-14 units TID, Mounjaro 5 mg weekly (recently started a couple of weeks ago) Current orders for Inpatient glycemic control: None  HgbA1C - 11.4% AG - 15, CO2 - 21 Trace ketones in urine  Inpatient Diabetes Program Recommendations:    Novolog 0-15 Q4H  Will likely need part of basal insulin - Semglee 10 units QD  Consider GLP-1 side effects since pt just started [facility] a few weeks ago.  Follow closely.  Thank you. Lorenda Peck, RD, LDN, [place] Inpatient Diabetes Coordinator [phone]

## 2022-06-04 ENCOUNTER — Encounter: Payer: Self-pay | Admitting: Physical Medicine and Rehabilitation

## 2022-06-04 LAB — GLUCOSE, CAPILLARY
Glucose-Capillary: 105 mg/dL — ABNORMAL HIGH (ref 70–99)
Glucose-Capillary: 97 mg/dL (ref 70–99)
Glucose-Capillary: 107 mg/dL — ABNORMAL HIGH (ref 70–99)
Glucose-Capillary: 127 mg/dL — ABNORMAL HIGH (ref 70–99)
Glucose-Capillary: 122 mg/dL — ABNORMAL HIGH (ref 70–99)

## 2022-06-04 LAB — CBC
HCT: 24.5 % — ABNORMAL LOW (ref 39.0–52.0)
Hemoglobin: 8.2 g/dL — ABNORMAL LOW (ref 13.0–17.0)
MCH: 30 pg (ref 26.0–34.0)
MCHC: 33.5 g/dL (ref 30.0–36.0)
MCV: 89.7 fL (ref 80.0–100.0)
Platelets: 208 10*3/uL (ref 150–400)
RBC: 2.73 MIL/uL — ABNORMAL LOW (ref 4.22–5.81)
RDW: 12.1 % (ref 11.5–15.5)
WBC: 10.6 10*3/uL — ABNORMAL HIGH (ref 4.0–10.5)
nRBC: 0 % (ref 0.0–0.2)

## 2022-06-04 LAB — BASIC METABOLIC PANEL
Anion gap: 7 (ref 5–15)
BUN: 40 mg/dL — ABNORMAL HIGH (ref 6–20)
CO2: 22 mmol/L (ref 22–32)
Calcium: 8.2 mg/dL — ABNORMAL LOW (ref 8.9–10.3)
Chloride: 108 mmol/L (ref 98–111)
Creatinine, Ser: 3.56 mg/dL — ABNORMAL HIGH (ref 0.61–1.24)
GFR, Estimated: 20 mL/min — ABNORMAL LOW (ref >60)
Glucose, Bld: 107 mg/dL — ABNORMAL HIGH (ref 70–99)
Potassium: 4.1 mmol/L (ref 3.5–5.1)
Sodium: 137 mmol/L (ref 135–145)

## 2022-06-04 LAB — URINE CULTURE: Culture: NO GROWTH

## 2022-06-04 MED ORDER — SODIUM CHLORIDE 0.9 % IV SOLN: INTRAVENOUS | Status: AC

## 2022-06-04 NOTE — Progress Notes (Signed)
Spoke with patient's wife Courtney Paris [phone] and discussed MRI spine with no evidence of recurrence of infection

## 2022-06-04 NOTE — Inpatient Diabetes Management (Signed)
Inpatient Diabetes Program Recommendations  AACE/ADA: New Consensus Statement on Inpatient Glycemic Control (2015)  Target Ranges:  Prepandial:   less than 140 mg/dL      Peak postprandial:   less than 180 mg/dL (1-2 hours)      Critically ill patients:  140 - 180 mg/dL   Lab Results  Component Value Date   GLUCAP 105 (H) 06/04/2022   HGBA1C 11.4 (A) 02/18/2022    Latest Reference Range & Units 06/03/22 16:33 06/03/22 16:52 06/03/22 17:16 06/03/22 20:49 06/04/22 08:33  Glucose-Capillary 70 - 99 mg/dL 53 (L) 57 (L) 108 (H) 113 (H) 105 (H)  (L): Data is abnormally low (H): Data is abnormally high Review of Glycemic Control  Diabetes history: type 2 Outpatient Diabetes medications: Toujeo 20 units daily, Humalog sliding scale, Mounjaro 5 mg weekly Current orders for Inpatient glycemic control: Semglee 15 units daily, Novolog 0-15 units TID, 0-5 units at [facility]  Inpatient Diabetes Program Recommendations:   Noted that patient had low blood sugars last evening at 1600.  Recommend decreasing Semglee to 12 units daily if blood sugars continue to be low.   Will continue to monitor blood sugars while in the hospital.  Harvel Ricks RN BSN CDE Diabetes Coordinator Pager: [phone]  8am-5pm

## 2022-06-04 NOTE — Progress Notes (Signed)
PROGRESS NOTE    JEET SHOUGH  WOE:[id number] DOB: 28-Oct-1975 DOA: 06/02/2022 PCP: [name], NP    Chief Complaint  Patient presents with   Abdominal Pain    Brief Narrative:   Jose [name] is a 46 y.o. male with medical history significant of hypertension, hyperlipidemia, diabetes mellitus type 2, CKD stage IV, and history of thoracic osteomyelitis/discitis who presented with complaints o of feeling unwell for the last 4 days.  Patient reports that he has been not able to keep any food or liquids down.  He reports having pain in his back where he previously had osteomyelitis and required surgery last year.  He had been on antibiotics up until April of this year when it was discontinued after following with ID.  He states that the pain is dull, but becomes sharp and throbbing with any kind of movement.  Due to the symptoms he has been unable to sleep at night and has had subjective fever and chills.  He did report having mild tremor, unsteady gait due to neuropathy, blurry vision related to diabetic retinopathy.  He denies having any chest pain, shortness of breath, or dysuria symptoms.   On admission into the emergency department patient was seen to be afebrile with pulse 10 3-124, respirations elevated up to 25 and blood pressure as high as 220/124, and O2 saturations as low as 90% with improvement on 2 L of nasal cannula oxygen. Labs from yesterday significant for WBC 17.2, hemoglobin 10.5, BUN 51, creatinine 3.78, glucose 258, CRP 0.8, CRP 72, and lactic acid 0.8.  Chest x-ray noted no acute abnormality.  Urinalysis showed no signs of infection, but was positive for glucose and ketones. Influenza and COVID-19 screening were negative.  Blood cultures had been obtained.  Patient received bolus of 3 L of IV fluids, acetaminophen, pain medication, antiemetics, vancomycin, cefepime, and metronidazole.  MRI of the thoracic spine showed sequela of discitis/osteomyelitis at T4-T5 with  persistent but substantially decreased marrow edema and enhancement.   Assessment & Plan:   Principal Problem:   SIRS (systemic inflammatory response syndrome) (HCC) Active Problems:   Vertebral osteomyelitis (HCC)   Hypertensive urgency   Acute kidney injury superimposed on chronic kidney disease (HCC)   Nausea and vomiting   Uncontrolled type 2 diabetes mellitus with hyperglycemia, with long-term current use of insulin (HCC)   Anemia   GAD (generalized anxiety disorder)  SIRS -Concern for recurrent osteomyelitis, ID input greatly appreciated, imaging significant for chronic osteomyelitis, no acute infection or findings, as well I have discussed imaging findings surgery on-call, no acute concerns. -Blood culture sent on 8/9 remains negative, will monitor off antibiotics. -ESR downtrending which is reassuring. -We will consult PT/OT   Hypertensive urgency Acute.On admission blood pressures had initially been seen elevated up to 220/124.  Home blood pressure regimen includes metoprolol 50 mg twice daily, furosemide 20 mg daily, and amlodipine 5 mg daily. -Continue metoprolol and amlodipine -Held furosemide due to AKI -Hydralazine IV as needed for elevated blood pressures   Acute kidney injury superimposed on chronic kidney disease stage IIIb Patient presented with creatinine elevated up to 3.78 with BUN 51.   -Continue with IV fluids  -avoid nephrotoxic medications  -  check bladder scan    Nausea and vomiting Patient reported having nausea and vomiting for which she was unable to tolerate any food or liquids over the last 4 days. -Antiemetics as needed   Uncontrolled diabetes mellitus type 2 with hyperglycemia, on long-term use of  insulin On admission glucose elevated to 258, but without elevated anion gap.  Last hemoglobin A1c 11.4 on 02/18/2022.  Home regimen appears to include 70/30 insulin 20 units daily with breakfast and he has not started tirzepatide 5 mg inject  weekly. -Hypoglycemic protocols -Pharmacy substitution of Semglee 15 units daily. -CBGs before every meal with moderate SSI -CBG appears to be controlled on current regimen.   Anemia of chronic kidney disease Hemoglobin 10.5 g/dL.  No reports of bleeding. -Continue to monitor   Anxiety  -Continue home regimen of Paxil and hydroxyzine     DVT prophylaxis: [place] heparin Code Status: Full Family Communication: Cussed with patient at length, answered his questions Disposition:   Status is: Inpatient    Consultants:  ID Asked with neurosurgery by phone   Subjective:  Reports nausea, poor appetite, complaining of upper back pain  Objective: Vitals:   06/04/22 0800 06/04/22 0924 06/04/22 1200 06/04/22 1239  BP: (!) 163/95  (!) 158/75   Pulse: (!) 105 93 81   Resp: 17 19 14    Temp:      TempSrc:      SpO2: 95%   96%  Weight:      Height:        Intake/Output Summary (Last 24 hours) at 06/04/2022 1500 Last data filed at 06/03/2022 1628 Gross per 24 hour  Intake 211.13 ml  Output --  Net 211.13 ml   Filed Weights   06/02/22 1209 06/04/22 0345  Weight: 95.3 kg 97.9 kg    Examination:  Awake Alert, Oriented X 3, No new F.N deficits, Normal affect Symmetrical Chest wall movement, Good air movement bilaterally, CTAB RRR,No Gallops,Rubs or new Murmurs, No Parasternal Heave +ve B.Sounds, Abd Soft, No tenderness, No rebound - guarding or rigidity. No Cyanosis, Clubbing or edema, No new Rash or bruise   Tenderness in the upper back to palpation    Data Reviewed: I have personally reviewed following labs and imaging studies  CBC: Recent Labs  Lab 06/02/22 1212 06/04/22 0332  WBC 17.2* 10.6*  HGB 10.5* 8.2*  HCT 29.9* 24.5*  MCV 85.7 89.7  PLT 298 034    Basic Metabolic Panel: Recent Labs  Lab 06/02/22 1212 06/03/22 1120 06/04/22 0332  NA 137 139 137  K 4.9 4.5 4.1  CL 101 107 108  CO2 21* 24 22  GLUCOSE 258* 191* 107*  BUN 51* 41* 40*  CREATININE  3.78* 3.33* 3.56*  CALCIUM 8.9 8.7* 8.2*    GFR: Estimated Creatinine Clearance: 30.3 mL/min (A) (by C-G formula based on SCr of 3.56 mg/dL (H)).  Liver Function Tests: Recent Labs  Lab 06/02/22 1212  AST 16  ALT 10  ALKPHOS 138*  BILITOT 0.7  PROT 7.5  ALBUMIN 4.2    CBG: Recent Labs  Lab 06/03/22 1652 06/03/22 1716 06/03/22 2049 06/04/22 0833 06/04/22 1221  GLUCAP 57* 108* 113* 105* 97     Recent Results (from the past 240 hour(s))  SARS Coronavirus 2 by RT PCR (hospital order, performed in [facility] hospital lab) *cepheid single result test* Anterior Nasal Swab     Status: None   Collection Time: 06/02/22 12:13 PM   Specimen: Anterior Nasal Swab  Result Value Ref Range Status   SARS Coronavirus 2 by RT PCR NEGATIVE NEGATIVE Final    Comment: (NOTE) SARS-CoV-2 target nucleic acids are NOT DETECTED.  The SARS-CoV-2 RNA is generally detectable in upper and lower respiratory specimens during the acute phase of infection. The lowest concentration  of SARS-CoV-2 viral copies this assay can detect is 250 copies / mL. A negative result does not preclude SARS-CoV-2 infection and should not be used as the sole basis for treatment or other patient management decisions.  A negative result may occur with improper specimen collection / handling, submission of specimen other than nasopharyngeal swab, presence of viral mutation(s) within the areas targeted by this assay, and inadequate number of viral copies (<250 copies / mL). A negative result must be combined with clinical observations, patient history, and epidemiological information.  Fact Sheet for Patients:   https://www.patel.info/  Fact Sheet for Healthcare Providers: https://hall.com/  This test is not yet approved or  cleared by the [place] FDA and has been authorized for detection and/or diagnosis of SARS-CoV-2 by FDA under an Emergency Use Authorization  (EUA).  This EUA will remain in effect (meaning this test can be used) for the duration of the COVID-19 declaration under Section 564(b)(1) of the Act, 21 U.S.C. section 360bbb-3(b)(1), unless the authorization is terminated or revoked sooner.  Performed at [facility], [address] [place], [place] [address]   Resp Panel by RT-PCR (Flu A&B, Covid) Anterior Nasal Swab     Status: None   Collection Time: 06/02/22  3:18 PM   Specimen: Anterior Nasal Swab  Result Value Ref Range Status   SARS Coronavirus 2 by RT PCR NEGATIVE NEGATIVE Final    Comment: (NOTE) SARS-CoV-2 target nucleic acids are NOT DETECTED.  The SARS-CoV-2 RNA is generally detectable in upper respiratory specimens during the acute phase of infection. The lowest concentration of SARS-CoV-2 viral copies this assay can detect is 138 copies/mL. A negative result does not preclude SARS-Cov-2 infection and should not be used as the sole basis for treatment or other patient management decisions. A negative result may occur with  improper specimen collection/handling, submission of specimen other than nasopharyngeal swab, presence of viral mutation(s) within the areas targeted by this assay, and inadequate number of viral copies(<138 copies/mL). A negative result must be combined with clinical observations, patient history, and epidemiological information. The expected result is Negative.  Fact Sheet for Patients:  EntrepreneurPulse.com.au  Fact Sheet for Healthcare Providers:  IncredibleEmployment.be  This test is no t yet approved or cleared by the [place] FDA and  has been authorized for detection and/or diagnosis of SARS-CoV-2 by FDA under an Emergency Use Authorization (EUA). This EUA will remain  in effect (meaning this test can be used) for the duration of the COVID-19 declaration under Section 564(b)(1) of the Act, 21 U.S.C.section 360bbb-3(b)(1),  unless the authorization is terminated  or revoked sooner.       Influenza A by PCR NEGATIVE NEGATIVE Final   Influenza B by PCR NEGATIVE NEGATIVE Final    Comment: (NOTE) The Xpert Xpress SARS-CoV-2/FLU/RSV plus assay is intended as an aid in the diagnosis of influenza from Nasopharyngeal swab specimens and should not be used as a sole basis for treatment. Nasal washings and aspirates are unacceptable for Xpert Xpress SARS-CoV-2/FLU/RSV testing.  Fact Sheet for Patients: EntrepreneurPulse.com.au  Fact Sheet for Healthcare Providers: IncredibleEmployment.be  This test is not yet approved or cleared by the [place] FDA and has been authorized for detection and/or diagnosis of SARS-CoV-2 by FDA under an Emergency Use Authorization (EUA). This EUA will remain in effect (meaning this test can be used) for the duration of the COVID-19 declaration under Section 564(b)(1) of the Act, 21 U.S.C. section 360bbb-3(b)(1), unless the authorization is terminated or revoked.  Performed at [facility], [address] [place], [place] [address]   Blood Culture (routine x 2)     Status: None (Preliminary result)   Collection Time: 06/02/22  3:18 PM   Specimen: BLOOD RIGHT HAND  Result Value Ref Range Status   Specimen Description   Final    BLOOD RIGHT HAND Performed at [facility], [address] [place], [place] [address]    Special Requests   Final    BOTTLES DRAWN AEROBIC AND ANAEROBIC Blood Culture adequate volume Performed at [facility], [address], [place], [place] [address]    Culture   Final    NO GROWTH 2 DAYS Performed at [facility], [place] [address], [place], [place] [address]    Report Status PENDING  Incomplete  Blood Culture (routine x 2)     Status: None (Preliminary result)   Collection Time: 06/02/22  3:56 PM   Specimen: BLOOD  Result Value Ref Range  Status   Specimen Description   Final    BLOOD BLOOD LEFT ARM Performed at [facility], [address] [place], [place] [address]    Special Requests   Final    Blood Culture adequate volume BOTTLES DRAWN AEROBIC AND ANAEROBIC Performed at [facility], [address], [place], [place] [address]    Culture  Setup Time NO ORGANISMS SEEN  Final   Culture   Final    NO GROWTH 2 DAYS Performed at [facility], [place] [address], [place], [place] [address]    Report Status PENDING  Incomplete  Urine Culture     Status: None   Collection Time: 06/02/22  6:32 PM   Specimen: In/Out Cath Urine  Result Value Ref Range Status   Specimen Description   Final    IN/OUT CATH URINE Performed at [facility], [address], [place], [place] [address]    Special Requests   Final    NONE Performed at [facility], [address], [place], [place] [address]    Culture   Final    NO GROWTH Performed at [facility], [place] [address], [place], [place] [address]    Report Status 06/04/2022 FINAL  Final         Radiology Studies: MR THORACIC SPINE W WO CONTRAST  Result Date: 06/03/2022 CLINICAL DATA:  Upper back pain, history of osteomyelitis EXAM: MRI CERVICAL AND THORACIC SPINE WITHOUT AND WITH CONTRAST TECHNIQUE: Multiplanar and multiecho pulse sequences of the cervical spine, to include the craniocervical junction and cervicothoracic junction, and the thoracic spine, were obtained without and with intravenous contrast. CONTRAST:  9.68m GADAVIST GADOBUTROL 1 MMOL/ML IV SOLN COMPARISON:  Thoracic spine MRI 10/27/2021 FINDINGS: MRI CERVICAL SPINE Alignment: Mild retrolisthesis at C4-C5. Vertebrae: Minor degenerative endplate irregularity. No marrow edema. No suspicious osseous lesion. In Cord: No abnormal signal.  No abnormal intrathecal enhancement. Posterior Fossa, vertebral arteries, paraspinal tissues:  Unremarkable. Disc levels: C2-C3:  No canal or foraminal stenosis. C3-C4: Disc bulge with endplate osteophytes. Uncovertebral hypertrophy. Mild canal stenosis. Mild foraminal stenosis. C4-C5: Disc bulge with superimposed central protrusion endplate osteophytes. Moderate canal stenosis. No foraminal stenosis. C5-C6: Disc bulge with superimposed left foraminal protrusion and endplate osteophytes. Uncovertebral hypertrophy. Moderate canal stenosis. No right foraminal stenosis. Moderate to marked left foraminal stenosis. C6-C7:  Disc bulge.  No canal or foraminal stenosis. C7-T1:  No canal or foraminal stenosis. MRI THORACIC SPINE Alignment:  Stable. Vertebrae: Sequelae of  discitis/osteomyelitis at T4-T5 with severe T5 collapse and partial fusion across the disc space. Persistent but substantially decreased marrow edema and enhancement. Postoperative changes are present at this level with susceptibility artifact and posterior decompression. There remains canal stenosis due to osseous retropulsion with possible cord deformity. Cord:  As above.  Otherwise unremarkable. Paraspinal and other soft tissues: Chronic postoperative changes. Disc levels: No new degenerative canal or foraminal stenosis. IMPRESSION: Sequelae of discitis/osteomyelitis at T4-T5 with persistent but substantially decreased marrow edema and enhancement. No paraspinal edema or enhancement. Burtis Junes this represents chronic changes rather than recurrent disease. There is likely similar canal stenosis at this level due to osseous retropulsion. No evidence of discitis/osteomyelitis elsewhere. Cervical spine degenerative changes as detailed above. Electronically Signed   By: Macy Mis M.D.   On: 06/03/2022 09:39   MR Cervical Spine W or Wo Contrast  Result Date: 06/03/2022 CLINICAL DATA:  Upper back pain, history of osteomyelitis EXAM: MRI CERVICAL AND THORACIC SPINE WITHOUT AND WITH CONTRAST TECHNIQUE: Multiplanar and multiecho pulse sequences of the  cervical spine, to include the craniocervical junction and cervicothoracic junction, and the thoracic spine, were obtained without and with intravenous contrast. CONTRAST:  9.28m GADAVIST GADOBUTROL 1 MMOL/ML IV SOLN COMPARISON:  Thoracic spine MRI 10/27/2021 FINDINGS: MRI CERVICAL SPINE Alignment: Mild retrolisthesis at C4-C5. Vertebrae: Minor degenerative endplate irregularity. No marrow edema. No suspicious osseous lesion. In Cord: No abnormal signal.  No abnormal intrathecal enhancement. Posterior Fossa, vertebral arteries, paraspinal tissues: Unremarkable. Disc levels: C2-C3:  No canal or foraminal stenosis. C3-C4: Disc bulge with endplate osteophytes. Uncovertebral hypertrophy. Mild canal stenosis. Mild foraminal stenosis. C4-C5: Disc bulge with superimposed central protrusion endplate osteophytes. Moderate canal stenosis. No foraminal stenosis. C5-C6: Disc bulge with superimposed left foraminal protrusion and endplate osteophytes. Uncovertebral hypertrophy. Moderate canal stenosis. No right foraminal stenosis. Moderate to marked left foraminal stenosis. C6-C7:  Disc bulge.  No canal or foraminal stenosis. C7-T1:  No canal or foraminal stenosis. MRI THORACIC SPINE Alignment:  Stable. Vertebrae: Sequelae of discitis/osteomyelitis at T4-T5 with severe T5 collapse and partial fusion across the disc space. Persistent but substantially decreased marrow edema and enhancement. Postoperative changes are present at this level with susceptibility artifact and posterior decompression. There remains canal stenosis due to osseous retropulsion with possible cord deformity. Cord:  As above.  Otherwise unremarkable. Paraspinal and other soft tissues: Chronic postoperative changes. Disc levels: No new degenerative canal or foraminal stenosis. IMPRESSION: Sequelae of discitis/osteomyelitis at T4-T5 with persistent but substantially decreased marrow edema and enhancement. No paraspinal edema or enhancement. FBurtis Junesthis represents  chronic changes rather than recurrent disease. There is likely similar canal stenosis at this level due to osseous retropulsion. No evidence of discitis/osteomyelitis elsewhere. Cervical spine degenerative changes as detailed above. Electronically Signed   By: PMacy MisM.D.   On: 06/03/2022 09:39   MR Lumbar Spine W Wo Contrast  Result Date: 06/03/2022 CLINICAL DATA:  Back pain, nausea and vomiting. History of treated osteomyelitis. EXAM: MRI LUMBAR SPINE WITHOUT AND WITH CONTRAST TECHNIQUE: Multiplanar and multiecho pulse sequences of the lumbar spine were obtained without and with intravenous contrast. CONTRAST:  9.527mGADAVIST GADOBUTROL 1 MMOL/ML IV SOLN COMPARISON:  07/23/2021 FINDINGS: Segmentation: There are five lumbar type vertebral bodies. The last full intervertebral disc space is labeled L5-S1. This correlates with the prior MRI examination. Alignment:  Normal Vertebrae: Normal marrow signal. No bone lesions or fractures. No findings to suggest discitis or osteomyelitis in the lumbar spine. The facets are normally aligned. No pars  defects or evidence of septic arthritis involving the facet joints. Conus medullaris and cauda equina: Conus extends to the L1-2 level. Conus and cauda equina appear normal. Paraspinal and other soft tissues: No significant paraspinal or retroperitoneal findings. No inflammation/edema or abscess in the psoas muscles. Disc levels: No lumbar disc protrusions, spinal foraminal stenosis. IMPRESSION: Unremarkable lumbar spine MRI examination. No findings to suggest discitis or osteomyelitis. Electronically Signed   By: Marijo Sanes M.D.   On: 06/03/2022 09:34   CT Head Wo Contrast  Result Date: 06/02/2022 CLINICAL DATA:  Headache, new or worsening (Age >= 50y) EXAM: CT HEAD WITHOUT CONTRAST TECHNIQUE: Contiguous axial images were obtained from the base of the skull through the vertex without intravenous contrast. RADIATION DOSE REDUCTION: This exam was performed  according to the departmental dose-optimization program which includes automated exposure control, adjustment of the mA and/or kV according to patient size and/or use of iterative reconstruction technique. COMPARISON:  CT head 08/11/2021 FINDINGS: Brain: No evidence of large-territorial acute infarction. No parenchymal hemorrhage. No mass lesion. No extra-axial collection. No mass effect or midline shift. No hydrocephalus. Basilar cisterns are patent. Vascular: No hyperdense vessel. Skull: No acute fracture or focal lesion. Sinuses/Orbits: Paranasal sinuses and mastoid air cells are clear. The orbits are unremarkable. Other: None. IMPRESSION: No acute intracranial abnormality. Electronically Signed   By: Iven Finn M.D.   On: 06/02/2022 20:04        Scheduled Meds:  amLODipine  5 mg Oral Daily   bethanechol  10 mg Oral TID   heparin  5,000 Units Subcutaneous Q8H   insulin aspart  0-15 Units Subcutaneous TID WC   insulin aspart  0-5 Units Subcutaneous QHS   insulin glargine-yfgn  15 Units Subcutaneous Daily   metoprolol tartrate  50 mg Oral BID   mirtazapine  15 mg Oral QHS   ondansetron (ZOFRAN) IV  4 mg Intravenous Q6H   PARoxetine  20 mg Oral QHS   sodium chloride flush  3 mL Intravenous Q12H   tamsulosin  0.4 mg Oral QPC supper   Continuous Infusions:  sodium chloride 10 mL/hr at 06/03/22 1628   sodium chloride       LOS: 1 day       Phillips Climes, MD [facility]   To contact the attending provider between 7A-7P or the covering provider during after hours 7P-7A, please log into the web site [email or web address] and access using universal [place] password for that web site. If you do not have the password, please call the hospital operator.  06/04/2022, 3:00 PM

## 2022-06-05 ENCOUNTER — Inpatient Hospital Stay (HOSPITAL_COMMUNITY): Payer: 59

## 2022-06-05 ENCOUNTER — Encounter (HOSPITAL_COMMUNITY): Payer: Self-pay | Admitting: Internal Medicine

## 2022-06-05 LAB — HEMOGLOBIN A1C
Hgb A1c MFr Bld: 7.8 % — ABNORMAL HIGH (ref 4.8–5.6)
Mean Plasma Glucose: 177.16 mg/dL

## 2022-06-05 LAB — CBC
HCT: 22.9 % — ABNORMAL LOW (ref 39.0–52.0)
Hemoglobin: 7.8 g/dL — ABNORMAL LOW (ref 13.0–17.0)
MCH: 30 pg (ref 26.0–34.0)
MCHC: 34.1 g/dL (ref 30.0–36.0)
MCV: 88.1 fL (ref 80.0–100.0)
Platelets: 187 10*3/uL (ref 150–400)
RBC: 2.6 MIL/uL — ABNORMAL LOW (ref 4.22–5.81)
RDW: 11.9 % (ref 11.5–15.5)
WBC: 8.5 10*3/uL (ref 4.0–10.5)
nRBC: 0 % (ref 0.0–0.2)

## 2022-06-05 LAB — BASIC METABOLIC PANEL
Anion gap: 7 (ref 5–15)
BUN: 42 mg/dL — ABNORMAL HIGH (ref 6–20)
CO2: 22 mmol/L (ref 22–32)
Calcium: 8.1 mg/dL — ABNORMAL LOW (ref 8.9–10.3)
Chloride: 110 mmol/L (ref 98–111)
Creatinine, Ser: 3.95 mg/dL — ABNORMAL HIGH (ref 0.61–1.24)
GFR, Estimated: 18 mL/min — ABNORMAL LOW (ref >60)
Glucose, Bld: 93 mg/dL (ref 70–99)
Potassium: 3.8 mmol/L (ref 3.5–5.1)
Sodium: 139 mmol/L (ref 135–145)

## 2022-06-05 LAB — CK: Total CK: 97 U/L (ref 49–397)

## 2022-06-05 LAB — GLUCOSE, CAPILLARY
Glucose-Capillary: 116 mg/dL — ABNORMAL HIGH (ref 70–99)
Glucose-Capillary: 73 mg/dL (ref 70–99)
Glucose-Capillary: 106 mg/dL — ABNORMAL HIGH (ref 70–99)
Glucose-Capillary: 125 mg/dL — ABNORMAL HIGH (ref 70–99)

## 2022-06-05 LAB — SODIUM, URINE, RANDOM: Sodium, Ur: 88 mmol/L

## 2022-06-05 MED ORDER — POLYETHYLENE GLYCOL 3350 17 G PO PACK
17.0000 g | PACK | Freq: Every day | ORAL | Status: DC
  Administered 2022-06-06 – 2022-06-08 (×2): 17 g via ORAL
  Filled 2022-06-05 (×2): qty 1

## 2022-06-05 MED ORDER — OXYCODONE HCL 5 MG PO TABS
10.0000 mg | ORAL_TABLET | ORAL | Status: DC | PRN
  Administered 2022-06-05 – 2022-06-07 (×5): 10 mg via ORAL
  Filled 2022-06-05 (×6): qty 2

## 2022-06-05 MED ORDER — SODIUM CHLORIDE 0.9 % IV BOLUS: 500.0000 mL | Freq: Once | INTRAVENOUS | Status: DC

## 2022-06-05 MED ORDER — SODIUM CHLORIDE 0.9 % IV SOLN: INTRAVENOUS | Status: DC

## 2022-06-05 MED ORDER — METOPROLOL TARTRATE 25 MG PO TABS
25.0000 mg | ORAL_TABLET | Freq: Once | ORAL | Status: AC
  Administered 2022-06-05: 25 mg via ORAL
  Filled 2022-06-05: qty 1

## 2022-06-05 MED ORDER — AMLODIPINE BESYLATE 10 MG PO TABS
10.0000 mg | ORAL_TABLET | Freq: Every day | ORAL | Status: DC
  Administered 2022-06-05 – 2022-06-08 (×4): 10 mg via ORAL
  Filled 2022-06-05 (×4): qty 1

## 2022-06-05 MED ORDER — HYDRALAZINE HCL 50 MG PO TABS
100.0000 mg | ORAL_TABLET | Freq: Three times a day (TID) | ORAL | Status: DC
  Administered 2022-06-05 – 2022-06-08 (×9): 100 mg via ORAL
  Filled 2022-06-05 (×9): qty 2

## 2022-06-05 MED ORDER — METOPROLOL TARTRATE 50 MG PO TABS
75.0000 mg | ORAL_TABLET | Freq: Two times a day (BID) | ORAL | Status: DC
  Administered 2022-06-05 – 2022-06-08 (×6): 75 mg via ORAL
  Filled 2022-06-05 (×6): qty 1

## 2022-06-05 MED ORDER — SENNOSIDES-DOCUSATE SODIUM 8.6-50 MG PO TABS
2.0000 | ORAL_TABLET | Freq: Two times a day (BID) | ORAL | Status: DC
  Administered 2022-06-05 – 2022-06-08 (×5): 2 via ORAL
  Filled 2022-06-05 (×5): qty 2

## 2022-06-05 MED ORDER — HYDRALAZINE HCL 50 MG PO TABS
50.0000 mg | ORAL_TABLET | Freq: Three times a day (TID) | ORAL | Status: DC
  Administered 2022-06-05: 50 mg via ORAL
  Filled 2022-06-05: qty 1

## 2022-06-05 MED ORDER — SODIUM CHLORIDE 0.9 % IV BOLUS
750.0000 mL | Freq: Once | INTRAVENOUS | Status: AC
  Administered 2022-06-06: 750 mL via INTRAVENOUS

## 2022-06-05 MED ORDER — HYDRALAZINE HCL 10 MG PO TABS
10.0000 mg | ORAL_TABLET | Freq: Four times a day (QID) | ORAL | Status: DC
  Administered 2022-06-05: 10 mg via ORAL
  Filled 2022-06-05: qty 1

## 2022-06-05 NOTE — Evaluation (Signed)
Occupational Therapy Evaluation Patient Details Name: Jose [name] MRN: [id number] DOB: Jun 14, 1976 Today's Date: 06/05/2022   History of Present Illness Pt is a 46 y/o M presenting to ED on 8/9 with n/v and low grade temperature, MRI of thoracic spine revealing sequela of discitis/osteomyelitis at T4-5. PMH includes DM2, diabetic retinopathy, osteomyelitis of thoracic region, CKD, and HTN.   Clinical Impression   Pt independent at baseline with ADLs, uses cane for functional mobility. Lives with family who can provide assist at d/c. Pt currently needing min guard-min A for ADLs, supervision for bed mobility, and min guard for transfers with use of SPC. Pt reporting back pain and headache, VSS throughout session. Pt demo's good use of log roll technique and back precautions for comfort during session. Pt presenting with impairments listed below, will follow acutely. Recommend HHOT at d/c, may progress to no OT follow up.     Recommendations for follow up therapy are one component of a multi-disciplinary discharge planning process, led by the attending physician.  Recommendations may be updated based on patient status, additional functional criteria and insurance authorization.   Follow Up Recommendations  Home health OT (may progress to no OT follow up)    Assistance Recommended at Discharge Intermittent Supervision/Assistance  Patient can return home with the following A little help with walking and/or transfers;A little help with bathing/dressing/bathroom;Assistance with cooking/housework;Assist for transportation;Help with stairs or ramp for entrance    Functional Status Assessment  Patient has had a recent decline in their functional status and demonstrates the ability to make significant improvements in function in a reasonable and predictable amount of time.  Equipment Recommendations  None recommended by OT (pt has all needed DME)    Recommendations for Other Services PT  consult     Precautions / Restrictions Precautions Precautions: Back Precaution Comments: for comfort Restrictions Weight Bearing Restrictions: No      Mobility Bed Mobility Overal bed mobility: Needs Assistance Bed Mobility: Sidelying to Sit   Sidelying to sit: Supervision       General bed mobility comments: increased time, + use of bedrails    Transfers Overall transfer level: Needs assistance Equipment used:  (cane) Transfers: Sit to/from Stand Sit to Stand: Min guard                  Balance Overall balance assessment: Needs assistance Sitting-balance support: Feet supported Sitting balance-Leahy Scale: Good Sitting balance - Comments: can reach outside BOS without LOB   Standing balance support: During functional activity, Reliant on assistive device for balance Standing balance-Leahy Scale: Fair Standing balance comment: reliant on unilateral (RUE) support on SPC                           ADL either performed or assessed with clinical judgement   ADL Overall ADL's : Needs assistance/impaired Eating/Feeding: Modified independent   Grooming: Min guard   Upper Body Bathing: Min guard   Lower Body Bathing: Minimal assistance   Upper Body Dressing : Min guard Upper Body Dressing Details (indicate cue type and reason): donning new gown Lower Body Dressing: Minimal assistance   Toilet Transfer: Nature conservation officer;Ambulation (cane) Toilet Transfer Details (indicate cue type and reason): simulated Toileting- Clothing Manipulation and Hygiene: Min guard       Functional mobility during ADLs: Min guard;Cane       Vision   Additional Comments: per chart review, noted hx of blurred vision due to diabetic retinopathy;  WFL for basic ADL tasks     Perception     Praxis      Pertinent Vitals/Pain Pain Assessment Pain Assessment: Faces Pain Score: 6  Faces Pain Scale: Hurts even more Pain Location: pain in back and  headache Pain Descriptors / Indicators: Discomfort Pain Intervention(s): Limited activity within patient's tolerance, Monitored during session, Repositioned     Hand Dominance Right   Extremity/Trunk Assessment Upper Extremity Assessment Upper Extremity Assessment: Overall WFL for tasks assessed   Lower Extremity Assessment Lower Extremity Assessment: Defer to PT evaluation   Cervical / Trunk Assessment Cervical / Trunk Assessment: Normal   Communication Communication Communication: No difficulties   Cognition Arousal/Alertness: Awake/alert Behavior During Therapy: WFL for tasks assessed/performed Overall Cognitive Status: Within Functional Limits for tasks assessed                                       General Comments  VSS on RA    Exercises     Shoulder Instructions      Home Living Family/patient expects to be discharged to:: Private residence Living Arrangements: Spouse/significant other Available Help at Discharge: Family;Available 24 hours/day Type of Home: House Home Access: Stairs to enter [employer] of Steps: 3 steps without rails Entrance Stairs-Rails: None Home Layout: One level     Bathroom Shower/Tub: Teacher, early years/pre: Standard Bathroom Accessibility: Yes How Accessible: Accessible via walker Home Equipment: Cane - single point;Shower seat          Prior Functioning/Environment Prior Level of Function : Independent/Modified Independent;Working/employed;Driving             Mobility Comments: cane for mobility ADLs Comments: ind with ADLs, Artist for [employer]        OT Problem List: Decreased strength;Decreased range of motion;Decreased activity tolerance;Impaired balance (sitting and/or standing)      OT Treatment/Interventions: Self-care/ADL training;Therapeutic exercise;DME and/or AE instruction;Therapeutic activities;Energy conservation;Patient/family  education;Balance training    OT Goals(Current goals can be found in the care plan section) Acute Rehab OT Goals Patient Stated Goal: none stated OT Goal Formulation: With patient Time For Goal Achievement: 06/19/22 Potential to Achieve Goals: Good ADL Goals Pt Will Perform Upper Body Dressing: Independently;sitting;standing Pt Will Perform Lower Body Dressing: Independently;sitting/lateral leans;sit to/from stand Pt Will Perform Tub/Shower Transfer: Shower transfer;Tub transfer;Independently;shower seat;ambulating Additional ADL Goal #1: pt will perform bed mobility independently in prep for ADLs  OT Frequency: Min 2X/week    Co-evaluation              AM-PAC OT "6 Clicks" Daily Activity     Outcome Measure Help from another person eating meals?: None Help from another person taking care of personal grooming?: A Little Help from another person toileting, which includes using toliet, bedpan, or urinal?: A Little Help from another person bathing (including washing, rinsing, drying)?: A Little Help from another person to put on and taking off regular upper body clothing?: A Little Help from another person to put on and taking off regular lower body clothing?: A Little 6 Click Score: 19   End of Session Equipment Utilized During Treatment: Other (comment) (cane) Nurse Communication: Mobility status  Activity Tolerance: Patient tolerated treatment well Patient left: in chair;with call bell/phone within reach  OT Visit Diagnosis: Unsteadiness on feet (R26.81);Other abnormalities of gait and mobility (R26.89);Muscle weakness (generalized) (M62.81)  Time: 7639-4320 OT Time Calculation (min): 28 min Charges:  OT General Charges $OT Visit: 1 Visit OT Evaluation $OT Eval Low Complexity: 1 Low OT Treatments $Self Care/Home Management : 8-22 mins  Lynnda Child, OTD, OTR/L Acute Rehab [phone]) 832 - [place] 06/05/2022, 4:27 PM

## 2022-06-05 NOTE — Progress Notes (Signed)
PROGRESS NOTE    Jose [name]  CWU:[id number] DOB: Nov 17, 1975 DOA: 06/02/2022 PCP: [name], NP    Chief Complaint  Patient presents with   Abdominal Pain    Brief Narrative:   Jose [name] is a 46 y.o. male with medical history significant of hypertension, hyperlipidemia, diabetes mellitus type 2, CKD stage IV, and history of thoracic osteomyelitis/discitis who presented with complaints o of feeling unwell for the last 4 days.  Patient reports that he has been not able to keep any food or liquids down.  He reports having pain in his back where he previously had osteomyelitis and required surgery last year.  He had been on antibiotics up until April of this year when it was discontinued after following with ID.  He states that the pain is dull, but becomes sharp and throbbing with any kind of movement.  Due to the symptoms he has been unable to sleep at night and has had subjective fever and chills.  He did report having mild tremor, unsteady gait due to neuropathy, blurry vision related to diabetic retinopathy.  He denies having any chest pain, shortness of breath, or dysuria symptoms.   On admission into the emergency department patient was seen to be afebrile with pulse 10 3-124, respirations elevated up to 25 and blood pressure as high as 220/124, and O2 saturations as low as 90% with improvement on 2 L of nasal cannula oxygen. Labs from yesterday significant for WBC 17.2, hemoglobin 10.5, BUN 51, creatinine 3.78, glucose 258, CRP 0.8, CRP 72, and lactic acid 0.8.  Chest x-ray noted no acute abnormality.  Urinalysis showed no signs of infection, but was positive for glucose and ketones. Influenza and COVID-19 screening were negative.  Blood cultures had been obtained.  Patient received bolus of 3 L of IV fluids, acetaminophen, pain medication, antiemetics, vancomycin, cefepime, and metronidazole.  MRI of the thoracic spine showed sequela of discitis/osteomyelitis at T4-T5 with  persistent but substantially decreased marrow edema and enhancement.   Assessment & Plan:   Principal Problem:   SIRS (systemic inflammatory response syndrome) (HCC) Active Problems:   Vertebral osteomyelitis (HCC)   Hypertensive urgency   Acute kidney injury superimposed on chronic kidney disease (HCC)   Nausea and vomiting   Uncontrolled type 2 diabetes mellitus with hyperglycemia, with long-term current use of insulin (HCC)   Anemia   GAD (generalized anxiety disorder)  SIRS -Concern for recurrent osteomyelitis, ID input greatly appreciated, imaging significant for chronic osteomyelitis, no acute infection or findings, as well I have discussed imaging findings surgery on-call, no acute concerns. -Blood culture sent on 8/9 remains negative, continue to monitor off antibiotics -CRP within normal limit which is reassuring -PT/OT consulted   Hypertensive urgency Acute.On admission blood pressures had initially been seen elevated up to 220/124.  Home blood pressure regimen includes metoprolol 50 mg twice daily, furosemide 20 mg daily, and amlodipine 5 mg daily. -Patient reports blood pressure usually elevated at home with systolic being in the 388E to 170s -His medications has been adjusted, increase Norvasc, started on hydralazine, continue with as needed hydralazine as well   Acute kidney injury superimposed on chronic kidney disease stage IIIb Patient presented with elevated creatinine, baseline 2-2.5, it is 3.89 today -Pressure significantly elevated, will discontinue his IV fluid  -No evidence of retention as postvoid scan is 0  -UA on admission significant for proteinuria -Urine sodium is pending -Renal ultrasound is pending -Likely related to chronic kidney disease from uncontrolled blood pressure  and diabetes mellitus -Nephrology is consulted   Nausea and vomiting Patient reported having nausea and vomiting for which she was unable to tolerate any food or liquids over the  last 4 days. - improving   Uncontrolled diabetes mellitus type 2 with hyperglycemia, on long-term use of insulin On admission glucose elevated to 258, but without elevated anion gap.  Last hemoglobin A1c 11.4 on 02/18/2022.  Home regimen appears to include 70/30 insulin 20 units daily with breakfast and he has not started tirzepatide 5 mg inject weekly. -Hypoglycemic protocols -Pharmacy substitution of Semglee 15 units daily. -CBGs before every meal with moderate SSI -CBG appears to be controlled on current regimen.   Anemia of chronic kidney disease Hemoglobin 10.5 g/dL.  No reports of bleeding. -Continue to monitor   Anxiety  -Continue home regimen of Paxil and hydroxyzine     DVT prophylaxis: [place] heparin Code Status: Full Family Communication: Cussed with patient at length, answered his questions Disposition:   Status is: Inpatient    Consultants:  ID Discussed  with neurosurgery by phone Renal   Subjective:  No further vomiting, nausea has improved, still complaining of his upper back pain  Objective: Vitals:   06/05/22 0400 06/05/22 0815 06/05/22 1000 06/05/22 1310  BP: (!) 169/80 (!) 195/90 (!) 167/72 (!) 194/88  Pulse: 77 80 75 79  Resp: 14 17 16 20   Temp: 98.2 F (36.8 C) 98.7 F (37.1 C)  98.3 F (36.8 C)  TempSrc: Oral Oral  Oral  SpO2: 92% 93% 94% 95%  Weight:      Height:        Intake/Output Summary (Last 24 hours) at 06/05/2022 1313 Last data filed at 06/05/2022 1005 Gross per 24 hour  Intake 472.11 ml  Output 300 ml  Net 172.11 ml   Filed Weights   06/02/22 1209 06/04/22 0345  Weight: 95.3 kg 97.9 kg    Examination:  Awake Alert, Oriented X 3, No new F.N deficits, Normal affect Symmetrical Chest wall movement, Good air movement bilaterally, CTAB RRR,No Gallops,Rubs or new Murmurs, No Parasternal Heave +ve B.Sounds, Abd Soft, No tenderness, No rebound - guarding or rigidity. No Cyanosis, Clubbing or edema, No new Rash or bruise       Data Reviewed: I have personally reviewed following labs and imaging studies  CBC: Recent Labs  Lab 06/02/22 1212 06/04/22 0332 06/05/22 0310  WBC 17.2* 10.6* 8.5  HGB 10.5* 8.2* 7.8*  HCT 29.9* 24.5* 22.9*  MCV 85.7 89.7 88.1  PLT 298 208 539    Basic Metabolic Panel: Recent Labs  Lab 06/02/22 1212 06/03/22 1120 06/04/22 0332 06/05/22 0310  NA 137 139 137 139  K 4.9 4.5 4.1 3.8  CL 101 107 108 110  CO2 21* 24 22 22   GLUCOSE 258* 191* 107* 93  BUN 51* 41* 40* 42*  CREATININE 3.78* 3.33* 3.56* 3.95*  CALCIUM 8.9 8.7* 8.2* 8.1*    GFR: Estimated Creatinine Clearance: 27.3 mL/min (A) (by C-G formula based on SCr of 3.95 mg/dL (H)).  Liver Function Tests: Recent Labs  Lab 06/02/22 1212  AST 16  ALT 10  ALKPHOS 138*  BILITOT 0.7  PROT 7.5  ALBUMIN 4.2    CBG: Recent Labs  Lab 06/04/22 1651 06/04/22 2118 06/04/22 2143 06/05/22 0819 06/05/22 1309  GLUCAP 107* 127* 122* 116* 73     Recent Results (from the past 240 hour(s))  SARS Coronavirus 2 by RT PCR (hospital order, performed in [facility] hospital lab) *cepheid single  result test* Anterior Nasal Swab     Status: None   Collection Time: 06/02/22 12:13 PM   Specimen: Anterior Nasal Swab  Result Value Ref Range Status   SARS Coronavirus 2 by RT PCR NEGATIVE NEGATIVE Final    Comment: (NOTE) SARS-CoV-2 target nucleic acids are NOT DETECTED.  The SARS-CoV-2 RNA is generally detectable in upper and lower respiratory specimens during the acute phase of infection. The lowest concentration of SARS-CoV-2 viral copies this assay can detect is 250 copies / mL. A negative result does not preclude SARS-CoV-2 infection and should not be used as the sole basis for treatment or other patient management decisions.  A negative result may occur with improper specimen collection / handling, submission of specimen other than nasopharyngeal swab, presence of viral mutation(s) within the areas targeted by  this assay, and inadequate number of viral copies (<250 copies / mL). A negative result must be combined with clinical observations, patient history, and epidemiological information.  Fact Sheet for Patients:   https://www.patel.info/  Fact Sheet for Healthcare Providers: https://hall.com/  This test is not yet approved or  cleared by the [place] FDA and has been authorized for detection and/or diagnosis of SARS-CoV-2 by FDA under an Emergency Use Authorization (EUA).  This EUA will remain in effect (meaning this test can be used) for the duration of the COVID-19 declaration under Section 564(b)(1) of the Act, 21 U.S.C. section 360bbb-3(b)(1), unless the authorization is terminated or revoked sooner.  Performed at [facility], [address], [place], [place] [address]   Resp Panel by RT-PCR (Flu A&B, Covid) Anterior Nasal Swab     Status: None   Collection Time: 06/02/22  3:18 PM   Specimen: Anterior Nasal Swab  Result Value Ref Range Status   SARS Coronavirus 2 by RT PCR NEGATIVE NEGATIVE Final    Comment: (NOTE) SARS-CoV-2 target nucleic acids are NOT DETECTED.  The SARS-CoV-2 RNA is generally detectable in upper respiratory specimens during the acute phase of infection. The lowest concentration of SARS-CoV-2 viral copies this assay can detect is 138 copies/mL. A negative result does not preclude SARS-Cov-2 infection and should not be used as the sole basis for treatment or other patient management decisions. A negative result may occur with  improper specimen collection/handling, submission of specimen other than nasopharyngeal swab, presence of viral mutation(s) within the areas targeted by this assay, and inadequate number of viral copies(<138 copies/mL). A negative result must be combined with clinical observations, patient history, and epidemiological information. The expected result is  Negative.  Fact Sheet for Patients:  EntrepreneurPulse.com.au  Fact Sheet for Healthcare Providers:  IncredibleEmployment.be  This test is no t yet approved or cleared by the [place] FDA and  has been authorized for detection and/or diagnosis of SARS-CoV-2 by FDA under an Emergency Use Authorization (EUA). This EUA will remain  in effect (meaning this test can be used) for the duration of the COVID-19 declaration under Section 564(b)(1) of the Act, 21 U.S.C.section 360bbb-3(b)(1), unless the authorization is terminated  or revoked sooner.       Influenza A by PCR NEGATIVE NEGATIVE Final   Influenza B by PCR NEGATIVE NEGATIVE Final    Comment: (NOTE) The Xpert Xpress SARS-CoV-2/FLU/RSV plus assay is intended as an aid in the diagnosis of influenza from Nasopharyngeal swab specimens and should not be used as a sole basis for treatment. Nasal washings and aspirates are unacceptable for Xpert Xpress SARS-CoV-2/FLU/RSV testing.  Fact Sheet for Patients: EntrepreneurPulse.com.au  Fact Sheet for Healthcare Providers: IncredibleEmployment.be  This test is not yet approved or cleared by the [place] FDA and has been authorized for detection and/or diagnosis of SARS-CoV-2 by FDA under an Emergency Use Authorization (EUA). This EUA will remain in effect (meaning this test can be used) for the duration of the COVID-19 declaration under Section 564(b)(1) of the Act, 21 U.S.C. section 360bbb-3(b)(1), unless the authorization is terminated or revoked.  Performed at [facility], [address], [place], [place] [address]   Blood Culture (routine x 2)     Status: None (Preliminary result)   Collection Time: 06/02/22  3:18 PM   Specimen: BLOOD RIGHT HAND  Result Value Ref Range Status   Specimen Description   Final    BLOOD RIGHT HAND Performed at [facility], [address] [place], [place] [address]    Special Requests   Final    BOTTLES DRAWN AEROBIC AND ANAEROBIC Blood Culture adequate volume Performed at [facility], [address], [place], [place] [address]    Culture   Final    NO GROWTH 3 DAYS Performed at [facility], [place] [address] [place], [place] [address]    Report Status PENDING  Incomplete  Blood Culture (routine x 2)     Status: None (Preliminary result)   Collection Time: 06/02/22  3:56 PM   Specimen: BLOOD  Result Value Ref Range Status   Specimen Description   Final    BLOOD BLOOD LEFT ARM Performed at [facility], [address], [place], [place] [address]    Special Requests   Final    Blood Culture adequate volume BOTTLES DRAWN AEROBIC AND ANAEROBIC Performed at [facility], [address], [place], [place] [address]    Culture   Final    NO GROWTH 3 DAYS Performed at [facility], [place] [address], [place], [place] [address]    Report Status PENDING  Incomplete  Urine Culture     Status: None   Collection Time: 06/02/22  6:32 PM   Specimen: In/Out Cath Urine  Result Value Ref Range Status   Specimen Description   Final    IN/OUT CATH URINE Performed at [facility], [address] [place], [place] [address]    Special Requests   Final    NONE Performed at [facility], [address], [place], [place] [address]    Culture   Final    NO GROWTH Performed at [facility], [place] [address], [place], [place] [address]    Report Status 06/04/2022 FINAL  Final         Radiology Studies: US RENAL  Result Date: 06/05/2022 CLINICAL DATA:  Renal failure. EXAM: RENAL / URINARY TRACT ULTRASOUND COMPLETE COMPARISON:  None Available. FINDINGS: Right Kidney: Renal measurements: 12.3 x 5.9 x 5.1 cm = volume: 209 mL. Mild increased cortical echogenicity. Left Kidney: Renal measurements: 12.8 x  6.4 x 5.6 cm = volume: 239 mL. Mild increased cortical echogenicity. Bladder: The bladder is unremarkable. Other: None. IMPRESSION: Suggested medical renal disease with mild increased cortical echogenicity in the kidneys. No other abnormalities. Electronically Signed   By: Dorise Bullion III M.D.   On: 06/05/2022 09:50        Scheduled Meds:  amLODipine  10 mg Oral Daily   bethanechol  10 mg Oral TID   heparin  5,000 Units Subcutaneous Q8H   hydrALAZINE  50 mg Oral Q8H  insulin aspart  0-15 Units Subcutaneous TID WC   insulin aspart  0-5 Units Subcutaneous QHS   insulin glargine-yfgn  15 Units Subcutaneous Daily   metoprolol tartrate  50 mg Oral BID   mirtazapine  15 mg Oral QHS   ondansetron (ZOFRAN) IV  4 mg Intravenous Q6H   PARoxetine  20 mg Oral QHS   sodium chloride flush  3 mL Intravenous Q12H   tamsulosin  0.4 mg Oral QPC supper   Continuous Infusions:  sodium chloride 10 mL/hr at 06/03/22 1628   sodium chloride 125 mL/hr at 06/05/22 0747     LOS: 2 days       Phillips Climes, MD [facility]   To contact the attending provider between 7A-7P or the covering provider during after hours 7P-7A, please log into the web site [email or web address] and access using universal [place] password for that web site. If you do not have the password, please call the hospital operator.  06/05/2022, 1:13 PM

## 2022-06-05 NOTE — Consult Note (Signed)
Renal Service Consult Note [facility]  JIOVANNI [name] 06/05/2022 Sol Blazing, MD Requesting Physician: Dr. Waldron Labs  Reason for Consult: Renal failure HPI: The patient is a 46 y.o. year-old w/ hx of AKI, GAD, HL, macular degeneration, DM2 w/ retinopathy, HTN, HL, CKD stage 3 and hx of thoracic osteomyelitis/ discitis who presented for feeling unwell x 4-5 days. Was not able to keep any foods down at home. Also had pain in his back in the area he had previously had osteomyelitis. He had been on long-term IV abx which finished in April of this year. +fevers and chills and night seats, mild tremor. No SOB or dysuria. In ED HR 100-124, RR 25, BP 220/ 124, O2 sats 90% which improved on 2L O2.  WBC was 17k Hb 10, BUN 51, creat 3.78, LA 0.8.  CXR was clear, [place] did not show infection. Blood cx's taken and pt rec'd 3 L bolus IVF's, pain meds, antiemetics, IV vanc, cefepim and flagyl. MRI of the thoracic spine showed some spinal changes at T4-5 level. Pt was admitted.  UOP 300- 750 cc per day. BP's high the 1st 24-48hrs here , but now SBPs are 150 - 180 or less.  HR still up at 100- 120, RR 15-21, temp afebrile. WBC improving and fevers improving. Creat was 3.7 on admission and his not improved, up to 3.9 today. We are asked to see for renal failure.   IVF's were stopped due to peaking uncontrolled BP's which are now improving. Pt seen in room. He states she was sick w/ N/V for 3 days at the beginning of this week, and has been having bouts of nausea in the mornings for weeks off and on. No contrast. Did get IV vanc here 1 gm x 2, now dc'd. IV flagyl and cefepime also for 1-2 days then dc'd. No nsaids, no acei/ ARB. Did get MR contrast on 8/10.  Today nausea is mostly resolved.   Pt is f/b Dr Hollie Salk for his CKD.   ROS - denies CP, no joint pain, no HA, no blurry vision, no rash, no dysuria, no difficulty voiding   Past Medical History  Past Medical History:  Diagnosis Date   AKI  (acute kidney injury) ([place])    GAD (generalized anxiety disorder)    Hyperlipidemia    Macular degeneration, bilateral    Retinopathy    Type II diabetes mellitus with complication, uncontrolled    retinopathy, neuropathy, microalbuminuria   Past Surgical History  Past Surgical History:  Procedure Laterality Date   APPENDECTOMY     BIOPSY  08/10/2021   Procedure: BIOPSY;  Surgeon: Otis Brace, MD;  Location: [place];  Service: Gastroenterology;;   BUBBLE STUDY  07/29/2021   Procedure: BUBBLE STUDY;  Surgeon: Jerline Pain, MD;  Location: [facility];  Service: Cardiovascular;;   ESOPHAGOGASTRODUODENOSCOPY (EGD) WITH PROPOFOL N/A 08/10/2021   Procedure: ESOPHAGOGASTRODUODENOSCOPY (EGD) WITH PROPOFOL;  Surgeon: Otis Brace, MD;  Location: [place];  Service: Gastroenterology;  Laterality: N/A;   ESOPHAGOGASTRODUODENOSCOPY (EGD) WITH PROPOFOL N/A 08/24/2021   Procedure: ESOPHAGOGASTRODUODENOSCOPY (EGD) WITH PROPOFOL;  Surgeon: Otis Brace, MD;  Location: [place];  Service: Gastroenterology;  Laterality: N/A;   HERNIA REPAIR     IR FLUORO GUIDED NEEDLE PLC ASPIRATION/INJECTION LOC  07/28/2021   LUMBAR LAMINECTOMY/DECOMPRESSION MICRODISCECTOMY N/A 08/07/2021   Procedure: THORACIC FOUR - THORACIC FIVE LAMINECTOMY/DECOMPRESSION OF SPINAL CORD, DEBRIDEMENT OF ABSCESS, MICRODISCECTOMY, INTRAOPERATIVE ULTRASOUND;  Surgeon: Consuella Lose, MD;  Location: [place];  Service: Neurosurgery;  Laterality: N/A;   TEE WITHOUT CARDIOVERSION N/A 07/29/2021   Procedure: TRANSESOPHAGEAL ECHOCARDIOGRAM (TEE);  Surgeon: Jerline Pain, MD;  Location: [facility];  Service: Cardiovascular;  Laterality: N/A;   TRIGGER FINGER RELEASE Right 10/25/2019   Procedure: RIGHT INDEX FINGER RELEASE TRIGGER FINGER/A-1 PULLEY;  Surgeon: Daryll Brod, MD;  Location: [place];  Service: Orthopedics;  Laterality: Right;  IV REGIONAL FOREARM BLOCK   Family History  Family  History  Problem Relation Age of Onset   Diabetes Mother    Hyperlipidemia Mother    Stroke Mother    Diabetes Father    Hyperlipidemia Brother    Stroke Brother    ADD / ADHD Brother    ADD / ADHD Son    Social History  reports that he quit smoking about 22 months ago. His smoking use included cigarettes. He has a 20.00 pack-year smoking history. He has never used smokeless tobacco. He reports that he does not currently use alcohol. He reports that he does not use drugs. Allergies  Allergies  Allergen Reactions   Cranberry Itching   Hm Lidocaine Patch [Lidocaine] Dermatitis    Blisters skin    Melatonin Other (See Comments)    nightmares   Nsaids Other (See Comments)    Stage 3 kidney disease   Home medications Prior to Admission medications   Medication Sig Start Date End Date Taking? Authorizing Provider  Acetaminophen 500 MG capsule Take 500-1,000 mg by mouth every 6 (six) hours as needed for mild pain.   Yes [provider]  albuterol (VENTOLIN HFA) 108 (90 Base) MCG/ACT inhaler Inhale 1 puff into the lungs every 4 (four) hours as needed for wheezing or shortness of breath. 05/27/21  Yes [provider]  amLODipine (NORVASC) 5 MG tablet Take 5 mg by mouth daily. 10/29/21  Yes [provider]  bethanechol (URECHOLINE) 10 MG tablet Take 1 tablet (10 mg total) by mouth 3 (three) times daily. 09/21/21  Yes Love, Ivan Anchors, PA-C  cholecalciferol (VITAMIN D3) 25 MCG (1000 UNIT) tablet Take 1,000 Units by mouth daily.   Yes [provider]  furosemide (LASIX) 20 MG tablet Take 20 mg by mouth daily. 12/10/21  Yes [provider]  gabapentin (NEURONTIN) 100 MG capsule Take 2 capsules (200 mg total) by mouth 2 (two) times daily. Patient taking differently: Take 300 mg by mouth 2 (two) times daily. 09/21/21  Yes Love, Ivan Anchors, PA-C  hydrOXYzine (ATARAX) 25 MG tablet Take 1 tablet (25 mg total) by mouth 3 (three) times daily as needed for anxiety or  nausea. 01/13/22  Yes Lovorn, Jinny Blossom, MD  insulin NPH-regular Human (70-30) 100 UNIT/ML injection Inject 20 Units into the skin daily with breakfast.   Yes [provider]  metoprolol tartrate (LOPRESSOR) 50 MG tablet Take 1 tablet (50 mg total) by mouth 2 (two) times daily. 09/21/21  Yes Love, Ivan Anchors, PA-C  mirtazapine (REMERON) 15 MG tablet Take 1 tablet (15 mg total) by mouth at bedtime. 09/21/21  Yes Love, Ivan Anchors, PA-C  ondansetron (ZOFRAN-ODT) 4 MG disintegrating tablet Take 4 mg by mouth daily. 12/28/21  Yes [provider]  PARoxetine (PAXIL) 20 MG tablet Take 1 tablet (20 mg total) by mouth at bedtime. 09/21/21  Yes Love, Ivan Anchors, PA-C  polyethylene glycol (MIRALAX / GLYCOLAX) 17 g packet Take 17 g by mouth daily as needed for moderate constipation, mild constipation or severe constipation. 08/29/21  Yes Dahal, Marlowe Aschoff, MD  tamsulosin (FLOMAX) 0.4 MG  CAPS capsule Take 1 capsule (0.4 mg total) by mouth daily after supper. 09/21/21  Yes Love, Ivan Anchors, PA-C  tobramycin (TOBREX) 0.3 % ophthalmic solution Place 1 drop into both eyes as directed. Retina injections 11/03/21  Yes [provider]  atorvastatin (LIPITOR) 40 MG tablet Take 40 mg by mouth daily. Patient not taking: Reported on 06/03/2022    [provider]  Continuous Blood Gluc Sensor (FREESTYLE LIBRE 3 SENSOR) MISC 1 Device by Does not apply route every 14 (fourteen) days. Apply 1 sensor on upper arm every 14 days for continuous glucose monitoring 04/29/22   Elayne Snare, MD  insulin glargine, 1 Unit Dial, (TOUJEO SOLOSTAR) 300 UNIT/ML Solostar Pen Inject 20 Units into the skin daily. Patient not taking: Reported on 06/03/2022 04/29/22   Elayne Snare, MD  Insulin Pen Needle (PENTIPS) 32G X 4 MM MISC use as directed 09/21/21   Love, Ivan Anchors, PA-C  naloxone [facility]) nasal spray 4 mg/0.1 mL Use as needed for signs of overdose. Patient not taking: Reported on 06/03/2022 09/21/21   Love, Ivan Anchors, PA-C   tirzepatide [facility]) 5 MG/0.5ML Pen Inject 5 mg into the skin once a week. 04/30/22   Elayne Snare, MD     Vitals:   06/05/22 1656 06/05/22 1700 06/05/22 1854 06/05/22 2037  BP: (!) 149/81 (!) 160/82 (!) 178/89 (!) 172/87  Pulse: 79 76 82 85  Resp: 20 20 14 17   Temp:  98.2 F (36.8 C)  98.2 F (36.8 C)  TempSrc:  Oral  Oral  SpO2: 93% 94% 95% 95%  Weight:      Height:       Exam Gen alert, no distress No rash, cyanosis or gangrene Sclera anicteric, throat clear and slightly dry No jvd or bruits, flat neck veins Chest clear bilat to bases, no rales/ wheezing RRR no MRG Abd soft ntnd no mass or ascites +bs GU normal male MS no joint effusions or deformity Ext no LE or UE edema, no wounds or ulcers Neuro is alert, Ox 3 , nf   Home meds include - albuterol, amlodipine 5, bethanechol, furosemide 20 qd, metoprolol 50 bid, mirtazapine, paroxetine, tamsulosin, atrovastatin, naloxone prn, prns/ vits/ supps       Date  Creat    eGFR      2019- 21  0.86- 1.06 May 2021  2.35 >> 1.34    AKI episode      Sept - Nov '22 1.40 >> 4.53 >> 1.36  AKI episode       Jan 2023  2.13- 2.21   36- 38 ml/min, IIIb      Dec 18, 2021 2.48    33 ml/min          06/02/22  3.78    19      8/10  3.33    22      8/11  3.56    20      06/05/22  3.95    18       UA 8/9 - 0-5 epi/ wbc, 6-10 rbc, protein > 300, clear , trace ketone   UNa 88    CXR 8/9 - IMPRESSION: No active disease.     Renal US - 12.3/ 12.8 cm kidneys w/ ^'d echo and no hydronephrosis.          Assessment/ Plan: AKI on CKD 3b - b/l creatinine is 2.1- 2.4 from early 2023, eGFR 33- 38 ml/min.F/b Dr Hollie Salk at [facility].  Renal US w/o obstruction, UA +proteinuria o/w unremarkable. Pt got 3L LR on 8/9, and then IVFs which were dc'd on 8/10, since then creat is going back up. On exam he has no edema and likely is still dry. There are no obvious uremic signs/ symptoms. Would suspect that BP spike was not volume related and pt still needs more  vol to recover from his initial dehydration. Will rebolus and ^IVF"s to 100 cc/hr. Will get UP/C ratio. Will follow.  SIRS - all cx's were negative, and ID signed off and IV abx were dc'd.  Hypertensive urgency - improving on home metoprolol+ norvasc with added hydralazine po/ IV. Continue.  H/o thoracic osteomyelitis - ended abx in April.     Kelly Splinter  MD 06/05/2022, 8:39 PM Recent Labs  Lab 06/02/22 1212 06/03/22 1120 06/04/22 0332 06/05/22 0310  HGB 10.5*  --  8.2* 7.8*  ALBUMIN 4.2  --   --   --   CALCIUM 8.9   < > 8.2* 8.1*  CREATININE 3.78*   < > 3.56* 3.95*  K 4.9   < > 4.1 3.8   < > = values in this interval not displayed.   Inpatient medications:  amLODipine  10 mg Oral Daily   bethanechol  10 mg Oral TID   heparin  5,000 Units Subcutaneous Q8H   hydrALAZINE  100 mg Oral Q8H   insulin aspart  0-15 Units Subcutaneous TID WC   insulin aspart  0-5 Units Subcutaneous QHS   insulin glargine-yfgn  15 Units Subcutaneous Daily   metoprolol tartrate  75 mg Oral BID   mirtazapine  15 mg Oral QHS   ondansetron (ZOFRAN) IV  4 mg Intravenous Q6H   PARoxetine  20 mg Oral QHS   polyethylene glycol  17 g Oral Daily   senna-docusate  2 tablet Oral BID   sodium chloride flush  3 mL Intravenous Q12H   tamsulosin  0.4 mg Oral QPC supper    sodium chloride 10 mL/hr at 06/03/22 1628   sodium chloride 50 mL/hr at 06/05/22 1739   sodium chloride, acetaminophen **OR** acetaminophen, albuterol, hydrALAZINE, hydrOXYzine, ondansetron **OR** ondansetron (ZOFRAN) IV, mouth rinse, oxyCODONE, prochlorperazine

## 2022-06-05 NOTE — Progress Notes (Addendum)
ID Brief Note    Remains afebrile,  Leukocytosis has resolved  Of abtx since 8/11  8/9 blood cx 8/9 no growth in 3 days  8/9 Urine cx NG   Clinical presentation appears to be related to hypertensive urgency with AKI on CKD. Primary managing ID will sign off. Please recall if needed  Rosiland Oz, MD Infectious Disease Physician [facility] [address] [place], [place] [address] Phone: [phone]  Fax: [phone]

## 2022-06-05 NOTE — Evaluation (Signed)
Physical Therapy Evaluation Patient Details Name: Jose [name] MRN: [id number] DOB: 06-19-1976 Today's Date: 06/05/2022  History of Present Illness  Pt is a 46 y/o M presenting to ED on 8/9 with n/v and low grade temperature, MRI of thoracic spine revealing sequela of discitis/osteomyelitis at T4-5. PMH includes DM2, diabetic retinopathy, osteomyelitis of thoracic region, CKD, and HTN.  Clinical Impression  Pt was min assist overall for gait into the hallway until after ~150' he started to report that he felt lightheaded.  We sat, checked his vitals, HR and O2 were normal BP was 140/80.  Pt sat for ~5 mins and decided he could go again.  We went ~15 more feet and he started feeling worse.  Sat him down in the chair, RN was there at that time and re-checked his vitals as he was becoming less responsive,eyes were closing and he was looking pre-syncopal.  Pt wheeled back to the room in rolling chair and positione back supine in bed.  Bps remained 140s/80s-90s, Blood sugar was checked and was 106 and RN was calling MD who ordered a CT of his head.  Pt continued to endorse not feeling well with HA, however, better in bed than while walking.  PT left pt in the care of his RN and an Clinical research associate.     Recommendations for follow up therapy are one component of a multi-disciplinary discharge planning process, led by the attending physician.  Recommendations may be updated based on patient status, additional functional criteria and insurance authorization.  Follow Up Recommendations Home health PT      Assistance Recommended at Discharge Frequent or constant Supervision/Assistance  Patient can return home with the following  A little help with walking and/or transfers    Equipment Recommendations None recommended by PT  Recommendations for Other Services       Functional Status Assessment Patient has had a recent decline in their functional status and demonstrates the ability to make  significant improvements in function in a reasonable and predictable amount of time.     Precautions / Restrictions Precautions Precautions: Back Precaution Comments: for comfort Restrictions Weight Bearing Restrictions: No      Mobility  Bed Mobility Overal bed mobility: Needs Assistance Bed Mobility: Supine to Sit, Sit to Supine   Sidelying to sit: Supervision Supine to sit: Mod assist     General bed mobility comments: Supervision to come up to sitting EOB.  Mod assist to help to return to bed due to not feeling well at all, HA, sudden blurry vision, sweaty and feeling faint.    Transfers Overall transfer level: Needs assistance Equipment used: Straight cane (cane) Transfers: Sit to/from Stand, Bed to chair/wheelchair/BSC Sit to Stand: Min assist Stand pivot transfers: Mod assist, +2 physical assistance         General transfer comment: Min assist to stand from the bed to walk, mod two person assist to stand from rolling chair to get back into bed after episode in hallway    Ambulation/Gait Ambulation/Gait assistance: Min assist, Mod assist Gait Distance (Feet): 150 Feet Assistive device: Straight cane (holding railing in hallway with free hand) Gait Pattern/deviations: Step-through pattern, Decreased stance time - right, Decreased dorsiflexion - right, Decreased step length - right, Steppage Gait velocity: decreased Gait velocity interpretation: <1.8 ft/sec, indicate of risk for recurrent falls   General Gait Details: Pt with steppage gait pattern with R foot drag, instablility at right knee in stance, and difficulty progressing right foot forward.  Pt  reports h/o frequent falls while using the cane, he does own a RW and a rollator, but does not use them.  Stairs            Wheelchair Mobility    Modified Rankin (Stroke Patients Only)       Balance Overall balance assessment: Needs assistance Sitting-balance support: Feet supported, No upper extremity  supported Sitting balance-Leahy Scale: Good     Standing balance support: Single extremity supported Standing balance-Leahy Scale: Fair Standing balance comment: reliant on unilateral (RUE) support on [facility]                             Pertinent Vitals/Pain Pain Assessment Pain Assessment: Faces Faces Pain Scale: Hurts even more Pain Location: head Pain Descriptors / Indicators: Aching Pain Intervention(s): Limited activity within patient's tolerance, Monitored during session, Repositioned    Home Living Family/patient expects to be discharged to:: Private residence Living Arrangements: Spouse/significant other Available Help at Discharge: Family;Available 24 hours/day Type of Home: House Home Access: Stairs to enter Entrance Stairs-Rails: None Entrance Stairs-Number of Steps: 3 steps without rails   Home Layout: One level Home Equipment: Cane - single point;Shower seat Additional Comments: pt reports frequent falls, but does not use his RW, uses cane    Prior Function Prior Level of Function : Independent/Modified Independent;Working/employed;Driving             Mobility Comments: cane for mobility ADLs Comments: ind with ADLs, 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  Pt with instability at the knee during stance, reports he was fitted for an AFO at rehab and then never got it.  Question if [employer] was the one to fit him for it? RLE Sensation: decreased light touch LLE Deficits / Details: left leg is also weak, not as weak as right leg and does not show instability with gait.  Pt reports significant numbness and  tingling in bil legs more than his baseline. LLE Sensation: decreased light touch    Cervical / Trunk Assessment Cervical / Trunk Assessment: Other exceptions Cervical / Trunk Exceptions: h/o spinal surgery  Communication   Communication: No difficulties  Cognition Arousal/Alertness: Awake/alert Behavior During Therapy: WFL for tasks assessed/performed Overall Cognitive Status: Within Functional Limits for tasks assessed                                          General Comments General comments (skin integrity, edema, etc.): BPs taken several times 140s/80s-90s during episode of feeling dizzy and lightheaded, HA increased, pt said he felt like he was in a tunnel.  Returned to room with RN sitting in rolling chair from hallway as he nearly passed out, positioned back in bed RN checking blood sugar.    Exercises     Assessment/Plan    PT Assessment Patient needs continued PT services  PT Problem List Decreased strength;Decreased activity tolerance;Decreased balance;Decreased mobility;Decreased knowledge of use of DME;Impaired sensation;Pain       PT Treatment Interventions DME instruction;Stair training;Gait training;Functional mobility training;Therapeutic exercise;Therapeutic activities;Balance  training;Neuromuscular re-education;Patient/family education;Modalities    PT Goals (Current goals can be found in the Care Plan section)  Acute Rehab PT Goals Patient Stated Goal: to figure out what is going on with him PT Goal Formulation: With patient Time For Goal Achievement: 06/19/22 Potential to Achieve Goals: Good    Frequency Min 3X/week     Co-evaluation               AM-PAC PT "6 Clicks" Mobility  Outcome Measure Help needed turning from your back to your side while in a flat bed without using bedrails?: A Little Help needed moving from lying on your back to sitting on the side of a flat bed without using bedrails?: A Little Help needed moving to  and from a bed to a chair (including a wheelchair)?: A Little Help needed standing up from a chair using your arms (e.g., wheelchair or bedside chair)?: A Little Help needed to walk in hospital room?: A Lot Help needed climbing 3-5 steps with a railing? : Total 6 Click Score: 15    End of Session Equipment Utilized During Treatment: Gait belt Activity Tolerance: Other (comment) (pt limited by feeling of lightheadedness, HA, dizziness) Patient left: in bed;with call bell/phone within reach Nurse Communication: Mobility status PT Visit Diagnosis: Unsteadiness on feet (R26.81);Repeated falls (R29.6);Muscle weakness (generalized) (M62.81);Difficulty in walking, not elsewhere classified (R26.2);Pain Pain - Right/Left:  (head) Pain - part of body:  (head)    Time: 0177-9390 PT Time Calculation (min) (ACUTE ONLY): 30 min   Charges:   PT Evaluation $PT Eval Moderate Complexity: 1 Mod PT Treatments $Gait Training: 8-22 mins      Verdene Lennert, PT, DPT  Acute Rehabilitation Secure chat is best for contact #[phone] office

## 2022-06-06 DIAGNOSIS — E1122 Type 2 diabetes mellitus with diabetic chronic kidney disease: Secondary | ICD-10-CM

## 2022-06-06 LAB — CBC
HCT: 23.1 % — ABNORMAL LOW (ref 39.0–52.0)
Hemoglobin: 8.1 g/dL — ABNORMAL LOW (ref 13.0–17.0)
MCH: 30.5 pg (ref 26.0–34.0)
MCHC: 35.1 g/dL (ref 30.0–36.0)
MCV: 86.8 fL (ref 80.0–100.0)
Platelets: 186 10*3/uL (ref 150–400)
RBC: 2.66 MIL/uL — ABNORMAL LOW (ref 4.22–5.81)
RDW: 11.9 % (ref 11.5–15.5)
WBC: 6.7 10*3/uL (ref 4.0–10.5)
nRBC: 0 % (ref 0.0–0.2)

## 2022-06-06 LAB — BASIC METABOLIC PANEL
Anion gap: 7 (ref 5–15)
BUN: 40 mg/dL — ABNORMAL HIGH (ref 6–20)
CO2: 21 mmol/L — ABNORMAL LOW (ref 22–32)
Calcium: 7.9 mg/dL — ABNORMAL LOW (ref 8.9–10.3)
Chloride: 111 mmol/L (ref 98–111)
Creatinine, Ser: 3.86 mg/dL — ABNORMAL HIGH (ref 0.61–1.24)
GFR, Estimated: 18 mL/min — ABNORMAL LOW (ref >60)
Glucose, Bld: 94 mg/dL (ref 70–99)
Potassium: 3.8 mmol/L (ref 3.5–5.1)
Sodium: 139 mmol/L (ref 135–145)

## 2022-06-06 LAB — GLUCOSE, CAPILLARY
Glucose-Capillary: 75 mg/dL (ref 70–99)
Glucose-Capillary: 118 mg/dL — ABNORMAL HIGH (ref 70–99)
Glucose-Capillary: 149 mg/dL — ABNORMAL HIGH (ref 70–99)
Glucose-Capillary: 99 mg/dL (ref 70–99)

## 2022-06-06 LAB — PROTEIN / CREATININE RATIO, URINE
Creatinine, Urine: 104 mg/dL
Protein Creatinine Ratio: 3.25 mg/mg{Cre} — ABNORMAL HIGH (ref 0.00–0.15)
Total Protein, Urine: 338 mg/dL

## 2022-06-06 MED ORDER — TRAZODONE HCL 50 MG PO TABS
100.0000 mg | ORAL_TABLET | Freq: Every evening | ORAL | Status: DC | PRN
Start: 2022-06-06 — End: 2022-06-08
  Administered 2022-06-06 – 2022-06-07 (×2): 100 mg via ORAL
  Filled 2022-06-06 (×2): qty 2

## 2022-06-06 NOTE — Progress Notes (Signed)
[facility] Progress Note  Subjective: creat down slihgtly 3.8 today. No new c/o's. Feels a little better, nausea persists but slihglty better.   Vitals:   06/06/22 0445 06/06/22 0547 06/06/22 0829 06/06/22 1222  BP:  (!) 145/73 (!) 152/73 138/74  Pulse:   82 70  Resp:   18 16  Temp:   98.4 F (36.9 C) 98.3 F (36.8 C)  TempSrc:   Oral Oral  SpO2:   93% 94%  Weight: 103.9 kg     Height:        Exam: Gen alert, no distress No jvd or bruits, flat neck veins Chest clear bilat to bases RRR no MRG Abd soft ntnd no mass or ascites +bs Ext no LE or UE edema Neuro is alert, Ox 3 , nf    Home meds include - albuterol, amlodipine 5, bethanechol, furosemide 20 qd, metoprolol 50 bid, mirtazapine, paroxetine, tamsulosin, atrovastatin, naloxone prn, prns/ vits/ supps        Date                       Creat                                       eGFR      2019- 21                 0.86- 1.06 May 2021                2.35 >> 1.34                            AKI episode      Sept - Nov '22        1.40 >> 4.53 >> 1.36              AKI episode       Jan 2023                2.13- 2.21                                36- 38 ml/min, IIIb      Dec 18, 2021          2.48                                         33 ml/min                                                  06/02/22                     3.78                                         19      8/10  3.33                                         22      8/11                        3.56                                         20      06/05/22                   3.95                                         18              UA 8/9 - 0-5 epi/ wbc, 6-10 rbc, protein > 300, clear , trace ketone   UNa 88    CXR 8/9 - IMPRESSION: No active disease.     Renal US - 12.3/ 12.8 cm kidneys w/ ^'d echo and no hydronephrosis.    Assessment/ Plan: AKI on CKD 3b - b/l creatinine is 2.1- 2.4 from early 2023, eGFR 33- 38  ml/min. F/b Dr Hollie Salk at [facility]. Renal US w/o obstruction, UA +proteinuria o/w unremarkable. Suspect admitted w/ AKI due to sig vol depletion along w/ CKD and needs more fluids, still dry. CKD may be progressing as well. Will cont IVF's at 100 cc/hr. Creat down today slightly. Not sure why so much nausea.  Will follow.  SIRS - all cx's were negative, and ID signed off and IV abx were dc'd.  Hypertensive urgency - improving on home metoprolol+ norvasc with added hydralazine po/ IV. Continue. BP's are well controlled now.  DM2 - long-standing Chronic nausea - doubt this is uremia w/ eGFR > 15 ml/min. May be gastroparesis H/o thoracic osteomyelitis - ended abx in April.          Rob Zhavia Cunanan 06/06/2022, 3:19 PM   Recent Labs  Lab 06/02/22 1212 06/03/22 1120 06/05/22 0310 06/06/22 0325  HGB 10.5*   < > 7.8* 8.1*  ALBUMIN 4.2  --   --   --   CALCIUM 8.9   < > 8.1* 7.9*  CREATININE 3.78*   < > 3.95* 3.86*  K 4.9   < > 3.8 3.8   < > = values in this interval not displayed.   No results for input(s): "IRON", "TIBC", "FERRITIN" in the last 168 hours. Inpatient medications:  amLODipine  10 mg Oral Daily   bethanechol  10 mg Oral TID   heparin  5,000 Units Subcutaneous Q8H   hydrALAZINE  100 mg Oral Q8H   insulin aspart  0-15 Units Subcutaneous TID WC   insulin aspart  0-5 Units Subcutaneous QHS   insulin glargine-yfgn  15 Units Subcutaneous Daily   metoprolol tartrate  75 mg Oral BID   mirtazapine  15 mg Oral QHS   ondansetron (ZOFRAN) IV  4 mg Intravenous Q6H   PARoxetine  20 mg Oral QHS   polyethylene glycol  17 g Oral Daily   senna-docusate  2 tablet Oral BID   sodium chloride flush  3 mL Intravenous Q12H   tamsulosin  0.4 mg Oral QPC supper    sodium chloride 10 mL/hr at 06/03/22 1628   sodium chloride 100 mL/hr at 06/06/22 1214   sodium chloride, acetaminophen **OR** acetaminophen, albuterol, hydrALAZINE, hydrOXYzine, ondansetron **OR** ondansetron (ZOFRAN) IV, mouth rinse,  oxyCODONE, prochlorperazine, traZODone

## 2022-06-06 NOTE — Progress Notes (Signed)
Patient began insulin 04/2021 after admitted for COVID.  Patient managed outpatient by endocrinologist.  Wife concerned with mychart notes stating patient has uncontrolled diabetes on "long-term" use of insulin.  Will notify providers so they can discuss with wife during rounds today.

## 2022-06-06 NOTE — Progress Notes (Addendum)
PROGRESS NOTE    Jose [name]  TDD:[id number] DOB: 05-29-1976 DOA: 06/02/2022 PCP: [name], NP    Chief Complaint  Patient presents with   Abdominal Pain    Brief Narrative:   Jose [name] is a 46 y.o. male with medical history significant of hypertension, hyperlipidemia, diabetes mellitus type 2, CKD stage IV, and history of thoracic osteomyelitis/discitis who presented with complaints o of feeling unwell for the last 4 days.  Patient reports that he has been not able to keep any food or liquids down.  He reports having pain in his back where he previously had osteomyelitis and required surgery last year.  He had been on antibiotics up until April of this year when it was discontinued after following with ID.  He states that the pain is dull, but becomes sharp and throbbing with any kind of movement.  Due to the symptoms he has been unable to sleep at night and has had subjective fever and chills.  He did report having mild tremor, unsteady gait due to neuropathy, blurry vision related to diabetic retinopathy.  He denies having any chest pain, shortness of breath, or dysuria symptoms.   On admission into the emergency department patient was seen to be afebrile with pulse 10 3-124, respirations elevated up to 25 and blood pressure as high as 220/124, and O2 saturations as low as 90% with improvement on 2 L of nasal cannula oxygen. Labs from yesterday significant for WBC 17.2, hemoglobin 10.5, BUN 51, creatinine 3.78, glucose 258, CRP 0.8, CRP 72, and lactic acid 0.8.  Chest x-ray noted no acute abnormality.  Urinalysis showed no signs of infection, but was positive for glucose and ketones. Influenza and COVID-19 screening were negative.  Blood cultures had been obtained.  Patient received bolus of 3 L of IV fluids, acetaminophen, pain medication, antiemetics, vancomycin, cefepime, and metronidazole.  MRI of the thoracic spine showed sequela of discitis/osteomyelitis at T4-T5 with  persistent but substantially decreased marrow edema and enhancement.   Assessment & Plan:   Principal Problem:   SIRS (systemic inflammatory response syndrome) (HCC) Active Problems:   Vertebral osteomyelitis (HCC)   Hypertensive urgency   Acute kidney injury superimposed on chronic kidney disease (HCC)   Nausea and vomiting   Anemia   GAD (generalized anxiety disorder)   Type 2 diabetes mellitus with chronic kidney disease, with long-term current use of insulin (HCC)  SIRS -Concern for recurrent osteomyelitis, ID input greatly appreciated, imaging significant for chronic osteomyelitis, no acute infection or findings, as well I have discussed imaging findings surgery on-call, no acute concerns. -Blood culture sent on 8/9 remains negative, continue to monitor off antibiotics -CRP within normal limit which is reassuring -PT/OT consulted   Hypertensive urgency Acute.On admission blood pressures had initially been seen elevated up to 220/124.  Home blood pressure regimen includes metoprolol 50 mg twice daily, furosemide 20 mg daily, and amlodipine 5 mg daily. -Patient reports blood pressure usually elevated at home with systolic being in the 623J to 170s -His medications has been adjusted, his blood pressure remains significantly elevated, so hydralazine has increased to 100 mg oral 3 times daily, as well metoprolol has been increased to 75 mg oral twice daily, and Norvasc dose is maximized currently.     Acute kidney injury superimposed on chronic kidney disease stage IIIb Patient presented with elevated creatinine, baseline 2-2.5, it is 3.89 today -Pressure significantly elevated, will discontinue his IV fluid  -No evidence of retention as postvoid scan is  0  -UA on admission significant for proteinuria -Likely related to chronic kidney disease from uncontrolled blood pressure and diabetes mellitus -Renal input greatly appreciated, felt to be secondary to volume depletion, continue  with IV fluids -Renal ultrasound significant for suggested medical renal disease with mild increased cortical echogenicity of the kidneys  Nausea and vomiting Patient reported having nausea and vomiting for which she was unable to tolerate any food or liquids over the last 4 days. - improving   Diabetes mellitus, type II, type II, with insulin use, with renal complication On admission glucose elevated to 258, but without elevated anion gap.  Last hemoglobin A1c 11.4 on 02/18/2022.  Home regimen appears to include 70/30 insulin 20 units daily with breakfast and he has not started tirzepatide 5 mg inject weekly. -Hypoglycemic protocols -Pharmacy substitution of Semglee 15 units daily. -CBGs before every meal with moderate SSI -CBG appears to be controlled on current regimen. -A1c has improved, there is 7.8 this admission   Anemia of chronic kidney disease -Continue to monitor   Anxiety  -Continue home regimen of Paxil and hydroxyzine     DVT prophylaxis: [place] heparin Code Status: Full Family Communication: disussed with wife at bedside Disposition:   Status is: Inpatient    Consultants:  ID Discussed  with neurosurgery by phone Renal   Subjective:  He does report some headache this morning, he had some dizziness/lightheadedness at the end of PT yesterday, requesting something to help him sleep Objective: Vitals:   06/06/22 0400 06/06/22 0445 06/06/22 0547 06/06/22 0829  BP: (!) 166/86  (!) 145/73 (!) 152/73  Pulse: 74   82  Resp: 16   18  Temp: 98.1 F (36.7 C)   98.4 F (36.9 C)  TempSrc: Oral   Oral  SpO2: 94%   93%  Weight:  103.9 kg    Height:        Intake/Output Summary (Last 24 hours) at 06/06/2022 1150 Last data filed at 06/06/2022 1030 Gross per 24 hour  Intake --  Output 1350 ml  Net -1350 ml   Filed Weights   06/02/22 1209 06/04/22 0345 06/06/22 0445  Weight: 95.3 kg 97.9 kg 103.9 kg    Examination:  Awake Alert, Oriented X 3, No new F.N  deficits, Normal affect Symmetrical Chest wall movement, Good air movement bilaterally, CTAB RRR,No Gallops,Rubs or new Murmurs, No Parasternal Heave +ve B.Sounds, Abd Soft, No tenderness, No rebound - guarding or rigidity. No Cyanosis, Clubbing or edema, No new Rash or bruise       Data Reviewed: I have personally reviewed following labs and imaging studies  CBC: Recent Labs  Lab 06/02/22 1212 06/04/22 0332 06/05/22 0310 06/06/22 0325  WBC 17.2* 10.6* 8.5 6.7  HGB 10.5* 8.2* 7.8* 8.1*  HCT 29.9* 24.5* 22.9* 23.1*  MCV 85.7 89.7 88.1 86.8  PLT 298 208 187 382    Basic Metabolic Panel: Recent Labs  Lab 06/02/22 1212 06/03/22 1120 06/04/22 0332 06/05/22 0310 06/06/22 0325  NA 137 139 137 139 139  K 4.9 4.5 4.1 3.8 3.8  CL 101 107 108 110 111  CO2 21* 24 22 22  21*  GLUCOSE 258* 191* 107* 93 94  BUN 51* 41* 40* 42* 40*  CREATININE 3.78* 3.33* 3.56* 3.95* 3.86*  CALCIUM 8.9 8.7* 8.2* 8.1* 7.9*    GFR: Estimated Creatinine Clearance: 28.7 mL/min (A) (by C-G formula based on SCr of 3.86 mg/dL (H)).  Liver Function Tests: Recent Labs  Lab 06/02/22 1212  AST  16  ALT 10  ALKPHOS 138*  BILITOT 0.7  PROT 7.5  ALBUMIN 4.2    CBG: Recent Labs  Lab 06/05/22 1309 06/05/22 1658 06/05/22 2127 06/06/22 0742 06/06/22 1118  GLUCAP 73 106* 125* 75 118*     Recent Results (from the past 240 hour(s))  SARS Coronavirus 2 by RT PCR (hospital order, performed in [facility] hospital lab) *cepheid single result test* Anterior Nasal Swab     Status: None   Collection Time: 06/02/22 12:13 PM   Specimen: Anterior Nasal Swab  Result Value Ref Range Status   SARS Coronavirus 2 by RT PCR NEGATIVE NEGATIVE Final    Comment: (NOTE) SARS-CoV-2 target nucleic acids are NOT DETECTED.  The SARS-CoV-2 RNA is generally detectable in upper and lower respiratory specimens during the acute phase of infection. The lowest concentration of SARS-CoV-2 viral copies this assay can  detect is 250 copies / mL. A negative result does not preclude SARS-CoV-2 infection and should not be used as the sole basis for treatment or other patient management decisions.  A negative result may occur with improper specimen collection / handling, submission of specimen other than nasopharyngeal swab, presence of viral mutation(s) within the areas targeted by this assay, and inadequate number of viral copies (<250 copies / mL). A negative result must be combined with clinical observations, patient history, and epidemiological information.  Fact Sheet for Patients:   https://www.patel.info/  Fact Sheet for Healthcare Providers: https://hall.com/  This test is not yet approved or  cleared by the [place] FDA and has been authorized for detection and/or diagnosis of SARS-CoV-2 by FDA under an Emergency Use Authorization (EUA).  This EUA will remain in effect (meaning this test can be used) for the duration of the COVID-19 declaration under Section 564(b)(1) of the Act, 21 U.S.C. section 360bbb-3(b)(1), unless the authorization is terminated or revoked sooner.  Performed at [facility], [address] [place], [place] [address]   Resp Panel by RT-PCR (Flu A&B, Covid) Anterior Nasal Swab     Status: None   Collection Time: 06/02/22  3:18 PM   Specimen: Anterior Nasal Swab  Result Value Ref Range Status   SARS Coronavirus 2 by RT PCR NEGATIVE NEGATIVE Final    Comment: (NOTE) SARS-CoV-2 target nucleic acids are NOT DETECTED.  The SARS-CoV-2 RNA is generally detectable in upper respiratory specimens during the acute phase of infection. The lowest concentration of SARS-CoV-2 viral copies this assay can detect is 138 copies/mL. A negative result does not preclude SARS-Cov-2 infection and should not be used as the sole basis for treatment or other patient management decisions. A negative result may occur with   improper specimen collection/handling, submission of specimen other than nasopharyngeal swab, presence of viral mutation(s) within the areas targeted by this assay, and inadequate number of viral copies(<138 copies/mL). A negative result must be combined with clinical observations, patient history, and epidemiological information. The expected result is Negative.  Fact Sheet for Patients:  EntrepreneurPulse.com.au  Fact Sheet for Healthcare Providers:  IncredibleEmployment.be  This test is no t yet approved or cleared by the [place] FDA and  has been authorized for detection and/or diagnosis of SARS-CoV-2 by FDA under an Emergency Use Authorization (EUA). This EUA will remain  in effect (meaning this test can be used) for the duration of the COVID-19 declaration under Section 564(b)(1) of the Act, 21 U.S.C.section 360bbb-3(b)(1), unless the authorization is terminated  or revoked sooner.       Influenza  A by PCR NEGATIVE NEGATIVE Final   Influenza B by PCR NEGATIVE NEGATIVE Final    Comment: (NOTE) The Xpert Xpress SARS-CoV-2/FLU/RSV plus assay is intended as an aid in the diagnosis of influenza from Nasopharyngeal swab specimens and should not be used as a sole basis for treatment. Nasal washings and aspirates are unacceptable for Xpert Xpress SARS-CoV-2/FLU/RSV testing.  Fact Sheet for Patients: EntrepreneurPulse.com.au  Fact Sheet for Healthcare Providers: IncredibleEmployment.be  This test is not yet approved or cleared by the [place] FDA and has been authorized for detection and/or diagnosis of SARS-CoV-2 by FDA under an Emergency Use Authorization (EUA). This EUA will remain in effect (meaning this test can be used) for the duration of the COVID-19 declaration under Section 564(b)(1) of the Act, 21 U.S.C. section 360bbb-3(b)(1), unless the authorization is terminated  or revoked.  Performed at [facility], [address] [place], [place] [address]   Blood Culture (routine x 2)     Status: None (Preliminary result)   Collection Time: 06/02/22  3:18 PM   Specimen: BLOOD RIGHT HAND  Result Value Ref Range Status   Specimen Description   Final    BLOOD RIGHT HAND Performed at [facility], [address], [place], [place] [address]    Special Requests   Final    BOTTLES DRAWN AEROBIC AND ANAEROBIC Blood Culture adequate volume Performed at [facility], [address] [place], [place] [address]    Culture   Final    NO GROWTH 4 DAYS Performed at [facility], [place] [address] [place], [place] [address]    Report Status PENDING  Incomplete  Blood Culture (routine x 2)     Status: None (Preliminary result)   Collection Time: 06/02/22  3:56 PM   Specimen: BLOOD  Result Value Ref Range Status   Specimen Description   Final    BLOOD BLOOD LEFT ARM Performed at [facility], [address] [place], [place] [address]    Special Requests   Final    Blood Culture adequate volume BOTTLES DRAWN AEROBIC AND ANAEROBIC Performed at [facility], [address], [place], [place] [address]    Culture   Final    NO GROWTH 4 DAYS Performed at [facility], [place] [address], [place], [place] [address]    Report Status PENDING  Incomplete  Urine Culture     Status: None   Collection Time: 06/02/22  6:32 PM   Specimen: In/Out Cath Urine  Result Value Ref Range Status   Specimen Description   Final    IN/OUT CATH URINE Performed at [facility], [address], [place], [place] [address]    Special Requests   Final    NONE Performed at [facility], [address] [place], [place] [address]    Culture   Final    NO GROWTH Performed at [facility], [place] [address] [place],  [address]     Report Status 06/04/2022 FINAL  Final         Radiology Studies: CT HEAD WO CONTRAST (5MM)  Result Date: 06/05/2022 CLINICAL DATA:  Weakness and dizziness. EXAM: CT HEAD WITHOUT CONTRAST TECHNIQUE: Contiguous axial images were obtained from the base of the skull through the vertex without intravenous contrast. RADIATION DOSE REDUCTION: This exam was performed according to the departmental dose-optimization program which includes automated exposure control, adjustment of the mA and/or kV according to patient size and/or use  of iterative reconstruction technique. COMPARISON:  06/02/2022 FINDINGS: Brain: No evidence of intracranial hemorrhage, acute infarction, hydrocephalus, extra-axial collection, or mass lesion/mass effect. Vascular:  No hyperdense vessel or other acute findings. Skull: No evidence of fracture or other significant bone abnormality. Sinuses/Orbits:  No acute findings. Other: None. IMPRESSION: Negative noncontrast head CT. Electronically Signed   By: Marlaine Hind M.D.   On: 06/05/2022 18:57   US RENAL  Result Date: 06/05/2022 CLINICAL DATA:  Renal failure. EXAM: RENAL / URINARY TRACT ULTRASOUND COMPLETE COMPARISON:  None Available. FINDINGS: Right Kidney: Renal measurements: 12.3 x 5.9 x 5.1 cm = volume: 209 mL. Mild increased cortical echogenicity. Left Kidney: Renal measurements: 12.8 x 6.4 x 5.6 cm = volume: 239 mL. Mild increased cortical echogenicity. Bladder: The bladder is unremarkable. Other: None. IMPRESSION: Suggested medical renal disease with mild increased cortical echogenicity in the kidneys. No other abnormalities. Electronically Signed   By: Dorise Bullion III M.D.   On: 06/05/2022 09:50        Scheduled Meds:  amLODipine  10 mg Oral Daily   bethanechol  10 mg Oral TID   heparin  5,000 Units Subcutaneous Q8H   hydrALAZINE  100 mg Oral Q8H   insulin aspart  0-15 Units Subcutaneous TID WC   insulin aspart  0-5 Units Subcutaneous QHS   insulin glargine-yfgn  15  Units Subcutaneous Daily   metoprolol tartrate  75 mg Oral BID   mirtazapine  15 mg Oral QHS   ondansetron (ZOFRAN) IV  4 mg Intravenous Q6H   PARoxetine  20 mg Oral QHS   polyethylene glycol  17 g Oral Daily   senna-docusate  2 tablet Oral BID   sodium chloride flush  3 mL Intravenous Q12H   tamsulosin  0.4 mg Oral QPC supper   Continuous Infusions:  sodium chloride 10 mL/hr at 06/03/22 1628   sodium chloride 100 mL/hr at 06/06/22 0324     LOS: 3 days       Phillips Climes, MD [facility]   To contact the attending provider between 7A-7P or the covering provider during after hours 7P-7A, please log into the web site [email or web address] and access using universal [place] password for that web site. If you do not have the password, please call the hospital operator.  06/06/2022, 11:50 AM

## 2022-06-07 LAB — CULTURE, BLOOD (ROUTINE X 2)
Culture: NO GROWTH
Culture: NO GROWTH
Special Requests: ADEQUATE
Special Requests: ADEQUATE

## 2022-06-07 LAB — BASIC METABOLIC PANEL
Anion gap: 7 (ref 5–15)
BUN: 36 mg/dL — ABNORMAL HIGH (ref 6–20)
CO2: 19 mmol/L — ABNORMAL LOW (ref 22–32)
Calcium: 7.9 mg/dL — ABNORMAL LOW (ref 8.9–10.3)
Chloride: 113 mmol/L — ABNORMAL HIGH (ref 98–111)
Creatinine, Ser: 3.77 mg/dL — ABNORMAL HIGH (ref 0.61–1.24)
GFR, Estimated: 19 mL/min — ABNORMAL LOW (ref >60)
Glucose, Bld: 106 mg/dL — ABNORMAL HIGH (ref 70–99)
Potassium: 4 mmol/L (ref 3.5–5.1)
Sodium: 139 mmol/L (ref 135–145)

## 2022-06-07 LAB — GLUCOSE, CAPILLARY
Glucose-Capillary: 102 mg/dL — ABNORMAL HIGH (ref 70–99)
Glucose-Capillary: 134 mg/dL — ABNORMAL HIGH (ref 70–99)
Glucose-Capillary: 137 mg/dL — ABNORMAL HIGH (ref 70–99)
Glucose-Capillary: 76 mg/dL (ref 70–99)

## 2022-06-07 MED ORDER — MORPHINE SULFATE (PF) 4 MG/ML IV SOLN
4.0000 mg | INTRAVENOUS | Status: DC | PRN
  Administered 2022-06-07 – 2022-06-08 (×2): 4 mg via INTRAVENOUS
  Filled 2022-06-07 (×2): qty 1

## 2022-06-07 MED ORDER — NALOXONE HCL 0.4 MG/ML IJ SOLN: 0.4000 mg | INTRAMUSCULAR | Status: DC | PRN

## 2022-06-07 NOTE — Progress Notes (Signed)
PROGRESS NOTE    [name]  PZW:[id number] DOB: [birth year] DOA: 06/02/2022 PCP: [name], NP    Chief Complaint  Patient presents with   Abdominal Pain    Brief Narrative:   Jose [name] is a 46 y.o. male with medical history significant of hypertension, hyperlipidemia, diabetes mellitus type 2, CKD stage IV, and history of thoracic osteomyelitis/discitis who presented with complaints o of feeling unwell for the last 4 days.  Patient reports that he has been not able to keep any food or liquids down.  He reports having pain in his back where he previously had osteomyelitis and required surgery last year.  He had been on antibiotics up until April of this year when it was discontinued after following with ID.  He states that the pain is dull, but becomes sharp and throbbing with any kind of movement.  Due to the symptoms he has been unable to sleep at night and has had subjective fever and chills.  He did report having mild tremor, unsteady gait due to neuropathy, blurry vision related to diabetic retinopathy.  He denies having any chest pain, shortness of breath, or dysuria symptoms.   On admission into the emergency department patient was seen to be afebrile with pulse 10 3-124, respirations elevated up to 25 and blood pressure as high as 220/124, and O2 saturations as low as 90% with improvement on 2 L of nasal cannula oxygen. Labs from yesterday significant for WBC 17.2, hemoglobin 10.5, BUN 51, creatinine 3.78, glucose 258, CRP 0.8, CRP 72, and lactic acid 0.8.  Chest x-ray noted no acute abnormality.  Urinalysis showed no signs of infection, but was positive for glucose and ketones. Influenza and COVID-19 screening were negative.  Blood cultures had been obtained.  Patient received bolus of 3 L of IV fluids, acetaminophen, pain medication, antiemetics, vancomycin, cefepime, and metronidazole.  MRI of the thoracic spine showed sequela of discitis/osteomyelitis at T4-T5 with  persistent but substantially decreased marrow edema and enhancement.   Assessment & Plan:   Principal Problem:   SIRS (systemic inflammatory response syndrome) (HCC) Active Problems:   Vertebral osteomyelitis (HCC)   Hypertensive urgency   Acute kidney injury superimposed on chronic kidney disease (HCC)   Nausea and vomiting   Anemia   GAD (generalized anxiety disorder)   Type 2 diabetes mellitus with chronic kidney disease, with long-term current use of insulin (HCC)  SIRS -Concern for recurrent osteomyelitis, ID input greatly appreciated, imaging significant for chronic osteomyelitis, no acute infection or findings, as well I have discussed imaging findings surgery on-call, no acute concerns. -Blood culture sent on 8/9 remains negative, continue to monitor off antibiotics -CRP within normal limit which is reassuring -PT/OT consulted   Hypertensive urgency Acute.On admission blood pressures had initially been seen elevated up to 220/124.  Home blood pressure regimen includes metoprolol 50 mg twice daily, furosemide 20 mg daily, and amlodipine 5 mg daily. -Patient reports blood pressure usually elevated at home with systolic being in the 423N to 170s -His medications has been adjusted, as his blood pressure remains significantly elevated, so blood pressure much improved currently on 100 mg of hydralazine 3 times daily, metoprolol 75 mg twice daily and amlodipine 10 mg oral daily   Acute kidney injury superimposed on chronic kidney disease stage IIIb Patient presented with elevated creatinine, baseline 2-2.5, 3.9  -Renal input greatly appreciated, this is related to volume depletion, and some element of progression of his renal disease.   -No evidence of  retention as postvoid scan is 0  -UA on admission significant for proteinuria -Renal ultrasound significant for suggested medical renal disease with mild increased cortical echogenicity of the kidneys  Nausea and vomiting Patient  reported having nausea and vomiting for which she was unable to tolerate any food or liquids over the last 4 days. - improving   Diabetes mellitus, type II, type II, with insulin use, with renal complication On admission glucose elevated to 258, but without elevated anion gap.  Last hemoglobin A1c 11.4 on 02/18/2022.  Home regimen appears to include 70/30 insulin 20 units daily with breakfast and he has not started tirzepatide 5 mg inject weekly. -Hypoglycemic protocols -Pharmacy substitution of Semglee 15 units daily. -CBGs before every meal with moderate SSI -CBG appears to be controlled on current regimen. -A1c has improved, there is 7.8 this admission   Anemia of chronic kidney disease -Continue to monitor   Anxiety  -Continue home regimen of Paxil and hydroxyzine     DVT prophylaxis: Rosebud heparin Code Status: Full Family Communication: none at bedside today Disposition:   Status is: Inpatient    Consultants:  ID Discussed  with neurosurgery by phone Renal   Subjective:  Patient reports he is feeling much better today, was able to sit on at bedside chair for few hours yesterday.    Objective: Vitals:   06/07/22 0831 06/07/22 0912 06/07/22 1143 06/07/22 1144  BP:  (!) 151/77  127/73  Pulse:  77    Resp:    13  Temp: 98.5 F (36.9 C)  97.9 F (36.6 C)   TempSrc: Oral  Oral   SpO2:    93%  Weight:      Height:        Intake/Output Summary (Last 24 hours) at 06/07/2022 1404 Last data filed at 06/06/2022 1855 Gross per 24 hour  Intake 3872.5 ml  Output --  Net 3872.5 ml   Filed Weights   06/04/22 0345 06/06/22 0445 06/07/22 0417  Weight: 97.9 kg 103.9 kg 104.1 kg    Examination:  Awake Alert, Oriented X 3, No new F.N deficits, Normal affect Symmetrical Chest wall movement, Good air movement bilaterally, CTAB RRR,No Gallops,Rubs or new Murmurs, No Parasternal Heave +ve B.Sounds, Abd Soft, No tenderness, No rebound - guarding or rigidity. No Cyanosis,  Clubbing or edema, No new Rash or bruise        Data Reviewed: I have personally reviewed following labs and imaging studies  CBC: Recent Labs  Lab 06/02/22 1212 06/04/22 0332 06/05/22 0310 06/06/22 0325  WBC 17.2* 10.6* 8.5 6.7  HGB 10.5* 8.2* 7.8* 8.1*  HCT 29.9* 24.5* 22.9* 23.1*  MCV 85.7 89.7 88.1 86.8  PLT 298 208 187 355    Basic Metabolic Panel: Recent Labs  Lab 06/03/22 1120 06/04/22 0332 06/05/22 0310 06/06/22 0325 06/07/22 0654  NA 139 137 139 139 139  K 4.5 4.1 3.8 3.8 4.0  CL 107 108 110 111 113*  CO2 24 22 22  21* 19*  GLUCOSE 191* 107* 93 94 106*  BUN 41* 40* 42* 40* 36*  CREATININE 3.33* 3.56* 3.95* 3.86* 3.77*  CALCIUM 8.7* 8.2* 8.1* 7.9* 7.9*    GFR: Estimated Creatinine Clearance: 29.4 mL/min (A) (by C-G formula based on SCr of 3.77 mg/dL (H)).  Liver Function Tests: Recent Labs  Lab 06/02/22 1212  AST 16  ALT 10  ALKPHOS 138*  BILITOT 0.7  PROT 7.5  ALBUMIN 4.2    CBG: Recent Labs  Lab 06/06/22 1118  06/06/22 1526 06/06/22 2120 06/07/22 0828 06/07/22 1142  GLUCAP 118* 149* 99 102* 134*     Recent Results (from the past 240 hour(s))  SARS Coronavirus 2 by RT PCR (hospital order, performed in [facility] hospital lab) *cepheid single result test* Anterior Nasal Swab     Status: None   Collection Time: 06/02/22 12:13 PM   Specimen: Anterior Nasal Swab  Result Value Ref Range Status   SARS Coronavirus 2 by RT PCR NEGATIVE NEGATIVE Final    Comment: (NOTE) SARS-CoV-2 target nucleic acids are NOT DETECTED.  The SARS-CoV-2 RNA is generally detectable in upper and lower respiratory specimens during the acute phase of infection. The lowest concentration of SARS-CoV-2 viral copies this assay can detect is 250 copies / mL. A negative result does not preclude SARS-CoV-2 infection and should not be used as the sole basis for treatment or other patient management decisions.  A negative result may occur with improper specimen  collection / handling, submission of specimen other than nasopharyngeal swab, presence of viral mutation(s) within the areas targeted by this assay, and inadequate number of viral copies (<250 copies / mL). A negative result must be combined with clinical observations, patient history, and epidemiological information.  Fact Sheet for Patients:   https://www.patel.info/  Fact Sheet for Healthcare Providers: https://hall.com/  This test is not yet approved or  cleared by the [place] FDA and has been authorized for detection and/or diagnosis of SARS-CoV-2 by FDA under an Emergency Use Authorization (EUA).  This EUA will remain in effect (meaning this test can be used) for the duration of the COVID-19 declaration under Section 564(b)(1) of the Act, 21 U.S.C. section 360bbb-3(b)(1), unless the authorization is terminated or revoked sooner.  Performed at [facility], [address] [place], [place] [address]   Resp Panel by RT-PCR (Flu A&B, Covid) Anterior Nasal Swab     Status: None   Collection Time: 06/02/22  3:18 PM   Specimen: Anterior Nasal Swab  Result Value Ref Range Status   SARS Coronavirus 2 by RT PCR NEGATIVE NEGATIVE Final    Comment: (NOTE) SARS-CoV-2 target nucleic acids are NOT DETECTED.  The SARS-CoV-2 RNA is generally detectable in upper respiratory specimens during the acute phase of infection. The lowest concentration of SARS-CoV-2 viral copies this assay can detect is 138 copies/mL. A negative result does not preclude SARS-Cov-2 infection and should not be used as the sole basis for treatment or other patient management decisions. A negative result may occur with  improper specimen collection/handling, submission of specimen other than nasopharyngeal swab, presence of viral mutation(s) within the areas targeted by this assay, and inadequate number of viral copies(<138 copies/mL). A negative  result must be combined with clinical observations, patient history, and epidemiological information. The expected result is Negative.  Fact Sheet for Patients:  EntrepreneurPulse.com.au  Fact Sheet for Healthcare Providers:  IncredibleEmployment.be  This test is no t yet approved or cleared by the [place] FDA and  has been authorized for detection and/or diagnosis of SARS-CoV-2 by FDA under an Emergency Use Authorization (EUA). This EUA will remain  in effect (meaning this test can be used) for the duration of the COVID-19 declaration under Section 564(b)(1) of the Act, 21 U.S.C.section 360bbb-3(b)(1), unless the authorization is terminated  or revoked sooner.       Influenza A by PCR NEGATIVE NEGATIVE Final   Influenza B by PCR NEGATIVE NEGATIVE Final    Comment: (NOTE) The Xpert Xpress SARS-CoV-2/FLU/RSV plus assay  is intended as an aid in the diagnosis of influenza from Nasopharyngeal swab specimens and should not be used as a sole basis for treatment. Nasal washings and aspirates are unacceptable for Xpert Xpress SARS-CoV-2/FLU/RSV testing.  Fact Sheet for Patients: EntrepreneurPulse.com.au  Fact Sheet for Healthcare Providers: IncredibleEmployment.be  This test is not yet approved or cleared by the [place] FDA and has been authorized for detection and/or diagnosis of SARS-CoV-2 by FDA under an Emergency Use Authorization (EUA). This EUA will remain in effect (meaning this test can be used) for the duration of the COVID-19 declaration under Section 564(b)(1) of the Act, 21 U.S.C. section 360bbb-3(b)(1), unless the authorization is terminated or revoked.  Performed at [facility], [address], [place], [place] [address]   Blood Culture (routine x 2)     Status: None   Collection Time: 06/02/22  3:18 PM   Specimen: BLOOD RIGHT HAND  Result Value Ref Range  Status   Specimen Description   Final    BLOOD RIGHT HAND Performed at [facility], [address] [place], [place] [address]    Special Requests   Final    BOTTLES DRAWN AEROBIC AND ANAEROBIC Blood Culture adequate volume Performed at [facility], [address], [place], [place] [address]    Culture   Final    NO GROWTH 5 DAYS Performed at [facility], [place] [address] [place], [place] [address]    Report Status 06/07/2022 FINAL  Final  Blood Culture (routine x 2)     Status: None   Collection Time: 06/02/22  3:56 PM   Specimen: BLOOD  Result Value Ref Range Status   Specimen Description   Final    BLOOD BLOOD LEFT ARM Performed at [facility], [address], [place], [place] [address]    Special Requests   Final    Blood Culture adequate volume BOTTLES DRAWN AEROBIC AND ANAEROBIC Performed at [facility], [address], [place], [place] [address]    Culture   Final    NO GROWTH 5 DAYS Performed at [facility], [place] [address] [place], [place] [address]    Report Status 06/07/2022 FINAL  Final  Urine Culture     Status: None   Collection Time: 06/02/22  6:32 PM   Specimen: In/Out Cath Urine  Result Value Ref Range Status   Specimen Description   Final    IN/OUT CATH URINE Performed at [facility], [address], [place], [place] [address]    Special Requests   Final    NONE Performed at [facility], [address], [place], [place] [address]    Culture   Final    NO GROWTH Performed at [facility], [place] [address], [place],  [address]    Report Status 06/04/2022 FINAL  Final         Radiology Studies: CT HEAD WO CONTRAST (5MM)  Result Date: 06/05/2022 CLINICAL DATA:  Weakness and dizziness. EXAM: CT HEAD WITHOUT CONTRAST TECHNIQUE: Contiguous axial images were obtained from the base of the skull through  the vertex without intravenous contrast. RADIATION DOSE REDUCTION: This exam was performed according to the departmental dose-optimization program which includes automated exposure control, adjustment of the mA and/or kV according to patient size and/or use of iterative reconstruction technique. COMPARISON:  06/02/2022 FINDINGS: Brain: No evidence of intracranial hemorrhage, acute infarction, hydrocephalus, extra-axial collection, or mass lesion/mass effect. Vascular:  No hyperdense vessel  or other acute findings. Skull: No evidence of fracture or other significant bone abnormality. Sinuses/Orbits:  No acute findings. Other: None. IMPRESSION: Negative noncontrast head CT. Electronically Signed   By: Marlaine Hind M.D.   On: 06/05/2022 18:57        Scheduled Meds:  amLODipine  10 mg Oral Daily   bethanechol  10 mg Oral TID   heparin  5,000 Units Subcutaneous Q8H   hydrALAZINE  100 mg Oral Q8H   insulin aspart  0-15 Units Subcutaneous TID WC   insulin aspart  0-5 Units Subcutaneous QHS   insulin glargine-yfgn  15 Units Subcutaneous Daily   metoprolol tartrate  75 mg Oral BID   mirtazapine  15 mg Oral QHS   ondansetron (ZOFRAN) IV  4 mg Intravenous Q6H   PARoxetine  20 mg Oral QHS   polyethylene glycol  17 g Oral Daily   senna-docusate  2 tablet Oral BID   sodium chloride flush  3 mL Intravenous Q12H   tamsulosin  0.4 mg Oral QPC supper   Continuous Infusions:  sodium chloride 10 mL/hr at 06/03/22 1628   sodium chloride 100 mL/hr at 06/06/22 1855     LOS: 4 days       Phillips Climes, MD [facility]   To contact the attending provider between 7A-7P or the covering provider during after hours 7P-7A, please log into the web site [email or web address] and access using universal [place] password for that web site. If you do not have the password, please call the hospital operator.  06/07/2022, 2:04 PM

## 2022-06-07 NOTE — Progress Notes (Signed)
Occupational Therapy Treatment Patient Details Name: Jose [name] MRN: [id number] DOB: [birth year] Today's Date: 06/07/2022   History of present illness Pt is a 46 y/o M presenting to ED on 8/9 with n/v and low grade temperature, MRI of thoracic spine revealing sequela of discitis/osteomyelitis at T4-5. PMH includes DM2, diabetic retinopathy, osteomyelitis of thoracic region, CKD, and HTN.   OT comments  Pt making slow progression toward goals, soft BP this session (see below), RN aware and pt reporting increased fatigue. Pt able to complete toilet transfer and LB dressing with supervision -minA and use of cane. Pt presenting with impairments listed below, will follow acutely. Continue to recommend HHOT at d/c pending progression.  BP seated: 107/56 (73) BP standing after walking to bathroom: 96/62 (72) BP supine in bed: 109/66 (79)   Recommendations for follow up therapy are one component of a multi-disciplinary discharge planning process, led by the attending physician.  Recommendations may be updated based on patient status, additional functional criteria and insurance authorization.    Follow Up Recommendations  Home health OT (may progress to no OT follow up)    Assistance Recommended at Discharge Intermittent Supervision/Assistance  Patient can return home with the following  A little help with walking and/or transfers;A little help with bathing/dressing/bathroom;Assistance with cooking/housework;Assist for transportation;Help with stairs or ramp for entrance   Equipment Recommendations  None recommended by OT    Recommendations for Other Services PT consult    Precautions / Restrictions Precautions Precautions: Back Precaution Comments: for comfort Restrictions Weight Bearing Restrictions: No       Mobility Bed Mobility                    Transfers Overall transfer level: Needs assistance Equipment used: Straight cane Transfers: Sit to/from Stand, Bed to  chair/wheelchair/BSC Sit to Stand: Min assist                 Balance Overall balance assessment: Needs assistance Sitting-balance support: Feet supported, No upper extremity supported Sitting balance-Leahy Scale: Good Sitting balance - Comments: can reach outside BOS without LOB   Standing balance support: Single extremity supported Standing balance-Leahy Scale: Fair Standing balance comment: reliant on unilateral (RUE) support on [facility]                           ADL either performed or assessed with clinical judgement   ADL Overall ADL's : Needs assistance/impaired                     Lower Body Dressing: Supervision/safety;Sitting/lateral leans Lower Body Dressing Details (indicate cue type and reason): to don briefs Toilet Transfer: Supervision/safety;Ambulation;Regular Glass blower/designer Details (indicate cue type and reason): simulated         Functional mobility during ADLs: Supervision/safety;Cane      Extremity/Trunk Assessment Upper Extremity Assessment Upper Extremity Assessment: Overall WFL for tasks assessed   Lower Extremity Assessment Lower Extremity Assessment: Defer to PT evaluation        Vision   Additional Comments: blurry vision due to diabetic retinopathy at baseline   Perception Perception Perception: Not tested   Praxis Praxis Praxis: Not tested    Cognition Arousal/Alertness: Awake/alert Behavior During Therapy: WFL for tasks assessed/performed Overall Cognitive Status: Within Functional Limits for tasks assessed  Exercises      Shoulder Instructions       General Comments BP soft throughout session    Pertinent Vitals/ Pain       Pain Assessment Pain Assessment: Faces Pain Score: 6  Faces Pain Scale: Hurts even more Pain Location: back with movement Pain Descriptors / Indicators: Aching Pain Intervention(s): Limited activity within  patient's tolerance  Home Living                                          Prior Functioning/Environment              Frequency  Min 2X/week        Progress Toward Goals  OT Goals(current goals can now be found in the care plan section)  Progress towards OT goals: Progressing toward goals  Acute Rehab OT Goals Patient Stated Goal: none stated OT Goal Formulation: With patient Time For Goal Achievement: 06/19/22 Potential to Achieve Goals: Good ADL Goals Pt Will Perform Upper Body Dressing: Independently;sitting;standing Pt Will Perform Lower Body Dressing: Independently;sitting/lateral leans;sit to/from stand Pt Will Perform Tub/Shower Transfer: Shower transfer;Tub transfer;Independently;shower seat;ambulating Additional ADL Goal #1: pt will perform bed mobility independently in prep for ADLs  Plan Discharge plan remains appropriate;Frequency remains appropriate    Co-evaluation                 AM-PAC OT "6 Clicks" Daily Activity     Outcome Measure   Help from another person eating meals?: None Help from another person taking care of personal grooming?: A Little Help from another person toileting, which includes using toliet, bedpan, or urinal?: A Little Help from another person bathing (including washing, rinsing, drying)?: A Little Help from another person to put on and taking off regular upper body clothing?: A Little Help from another person to put on and taking off regular lower body clothing?: A Little 6 Click Score: 19    End of Session Equipment Utilized During Treatment: Other (comment) (cane)  OT Visit Diagnosis: Unsteadiness on feet (R26.81);Other abnormalities of gait and mobility (R26.89);Muscle weakness (generalized) (M62.81)   Activity Tolerance Patient tolerated treatment well   Patient Left in bed;with call bell/phone within reach;with bed alarm set   Nurse Communication Mobility status (soft BP, pt's IV beeping)         Time: 9983-3825 OT Time Calculation (min): 17 min  Charges: OT General Charges $OT Visit: 1 Visit OT Treatments $Self Care/Home Management : 8-22 mins  Lynnda Child, OTD, OTR/L Acute Rehab [phone] [place] 06/07/2022, 4:06 PM

## 2022-06-07 NOTE — Progress Notes (Signed)
Physical Therapy Treatment Patient Details Name: Jose [name] MRN: [id number] DOB: Jul 18, 1976 Today's Date: 06/07/2022   History of Present Illness Pt is a 46 y/o M presenting to ED on 8/9 with n/v and low grade temperature, MRI of thoracic spine revealing sequela of discitis/osteomyelitis at T4-5. PMH includes DM2, diabetic retinopathy, osteomyelitis of thoracic region, CKD, and HTN.    PT Comments    Pt progressing well with mobility, tolerated hallway level gait with cane with x1 standing rest break and min cuing for form. Pt with no episodes of dizziness or near-syncope today, with BP stable throughout mobility. Pt reports feeling closer to baseline, will continue to follow.      Recommendations for follow up therapy are one component of a multi-disciplinary discharge planning process, led by the attending physician.  Recommendations may be updated based on patient status, additional functional criteria and insurance authorization.  Follow Up Recommendations  Home health PT     Assistance Recommended at Discharge Frequent or constant Supervision/Assistance  Patient can return home with the following A little help with walking and/or transfers   Equipment Recommendations  None recommended by PT    Recommendations for Other Services       Precautions / Restrictions Precautions Precautions: Back Precaution Comments: for comfort Restrictions Weight Bearing Restrictions: No     Mobility  Bed Mobility Overal bed mobility: Needs Assistance Bed Mobility: Supine to Sit, Sit to Supine     Supine to sit: Supervision Sit to supine: Supervision        Transfers Overall transfer level: Needs assistance Equipment used: Straight cane Transfers: Sit to/from Stand Sit to Stand: Supervision           General transfer comment: for safety, slow to rise and uses momentum-building rocking to stand    Ambulation/Gait Ambulation/Gait assistance: Min guard Gait Distance  (Feet): 150 Feet Assistive device: Straight cane Gait Pattern/deviations: Step-through pattern, Decreased stance time - right, Decreased dorsiflexion - right, Decreased step length - right, Steppage Gait velocity: decr     General Gait Details: pt struggling with sequencing steps with cane, demonstrates decreased R foot clearance during gait   Stairs             Wheelchair Mobility    Modified Rankin (Stroke Patients Only)       Balance Overall balance assessment: Needs assistance Sitting-balance support: Feet supported, No upper extremity supported Sitting balance-Leahy Scale: Good     Standing balance support: Single extremity supported Standing balance-Leahy Scale: Fair Standing balance comment: reliant on unilateral (RUE) support on SPC                            Cognition Arousal/Alertness: Awake/alert Behavior During Therapy: WFL for tasks assessed/performed Overall Cognitive Status: Within Functional Limits for tasks assessed                                          Exercises      General Comments General comments (skin integrity, edema, etc.): BP 100s/60s-70s during session in all positions, no orthostasis      Pertinent Vitals/Pain Pain Assessment Pain Assessment: Faces Faces Pain Scale: Hurts little more Pain Location: back with movement Pain Descriptors / Indicators: Aching Pain Intervention(s): Limited activity within patient's tolerance, Monitored during session, Repositioned    Home Living  Prior Function            PT Goals (current goals can now be found in the care plan section) Acute Rehab PT Goals Patient Stated Goal: to figure out what is going on with him PT Goal Formulation: With patient Time For Goal Achievement: 06/19/22 Potential to Achieve Goals: Good Progress towards PT goals: Progressing toward goals    Frequency    Min 3X/week      PT Plan Current  plan remains appropriate    Co-evaluation              AM-PAC PT "6 Clicks" Mobility   Outcome Measure  Help needed turning from your back to your side while in a flat bed without using bedrails?: A Little Help needed moving from lying on your back to sitting on the side of a flat bed without using bedrails?: A Little Help needed moving to and from a bed to a chair (including a wheelchair)?: A Little Help needed standing up from a chair using your arms (e.g., wheelchair or bedside chair)?: A Little Help needed to walk in hospital room?: A Little Help needed climbing 3-5 steps with a railing? : A Little 6 Click Score: 18    End of Session   Activity Tolerance: Patient tolerated treatment well Patient left: in bed;with call bell/phone within reach Nurse Communication: Mobility status PT Visit Diagnosis: Unsteadiness on feet (R26.81);Repeated falls (R29.6);Muscle weakness (generalized) (M62.81);Difficulty in walking, not elsewhere classified (R26.2);Pain     Time: 3825-0539 PT Time Calculation (min) (ACUTE ONLY): 16 min  Charges:  $Gait Training: 8-22 mins                     Stacie Glaze, PT DPT Acute Rehabilitation Services Pager [phone]  Office [phone]    Louis Matte 06/07/2022, 4:24 PM

## 2022-06-07 NOTE — TOC Initial Note (Signed)
Transition of Care [facility]) - Initial/Assessment Note    Patient Details  Name: Jose [name] MRN: [id number] Date of Birth: 11-28-1975  [facility] CM/SW Contact:    Cyndi Bender, RN Phone Number: 06/07/2022, 1:51 PM  Clinical Narrative:                 Spoke to patient regarding transition needs. Patient lives with wife and can drive himself. Patient agreeable to home health PT. Patient defers to [facility] to find highly rated agency. Spoke to [place] with [facility] and referral accepted.  Patient has all needed DME at this time. Address, Phone number and PCP verified.  TOC will continue to follow for needs.  Expected Discharge Plan: [place] Barriers to Discharge: Continued Medical Work up   Patient Goals and CMS Choice Patient states their goals for this hospitalization and ongoing recovery are:: return home CMS Medicare.gov Compare Post Acute Care list provided to:: Patient Choice offered to / list presented to : Patient  Expected Discharge Plan and Services Expected Discharge Plan: [place]   Discharge Planning Services: CM Consult Post Acute Care Choice: [place] arrangements for the past 2 months: Single Family Home                           HH Arranged: PT [place]: [place] Date [place]: 06/07/22 Time HH Agency Contacted: [phone] Representative spoke with at [place]: Tommi Rumps  Prior Living Arrangements/Services Living arrangements for the past 2 months: [place] Lives with:: Spouse Patient language and need for interpreter reviewed:: Yes Do you feel safe going back to the place where you live?: Yes      Need for Family Participation in Patient Care: Yes (Comment) Care giver support system in place?: Yes (comment)   Criminal Activity/Legal Involvement Pertinent to Current Situation/Hospitalization: No - Comment as needed  Activities of Daily Living Home Assistive  Devices/Equipment: Cane (specify quad or straight), Blood pressure cuff, CBG Meter, Eyeglasses ADL Screening (condition at time of admission) Patient's cognitive ability adequate to safely complete daily activities?: Yes Is the patient deaf or have difficulty hearing?: No Does the patient have difficulty seeing, even when wearing glasses/contacts?: No Does the patient have difficulty concentrating, remembering, or making decisions?: No Patient able to express need for assistance with ADLs?: Yes Does the patient have difficulty dressing or bathing?: No Independently performs ADLs?: Yes (appropriate for developmental age) Does the patient have difficulty walking or climbing stairs?: Yes Weakness of Legs: Both Weakness of Arms/Hands: Both  Permission Sought/Granted Permission sought to share information with : Case Manager       Permission granted to share info w AGENCY: HH        Emotional Assessment Appearance:: Appears stated age Attitude/Demeanor/Rapport: Engaged Affect (typically observed): Accepting Orientation: : Oriented to Self, Oriented to Place, Oriented to  Time, Oriented to Situation Alcohol / Substance Use: Not Applicable Psych Involvement: No (comment)  Admission diagnosis:  Hyperglycemia [R73.9] SIRS (systemic inflammatory response syndrome) (HCC) [R65.10] Sepsis with acute renal failure without septic shock, due to unspecified organism, unspecified acute renal failure type ([place]) [A41.9, R65.20, N17.9] Patient Active Problem List   Diagnosis Date Noted   Type 2 diabetes mellitus with chronic kidney disease, with long-term current use of insulin ([place]) 06/06/2022   Hypertensive urgency 06/03/2022   SIRS (systemic inflammatory response syndrome) ([place]) 06/02/2022   Vertebral  osteomyelitis ([place]) 12/19/2021   Chronic kidney disease 11/10/2021   Pathologic fracture of thoracic vertebrae, sequela 11/10/2021   Medication monitoring encounter 09/30/2021   GIB  (gastrointestinal bleeding) 09/21/2021   Duodenal ulcer 09/21/2021   Esophagitis 09/21/2021   Osteomyelitis of sacrum ([place]) 09/21/2021   Hyponatremia    Acute kidney injury superimposed on chronic kidney disease ([place])    Acute blood loss anemia    Labile blood glucose    Thoracic spondylosis with myelopathy 09/02/2021   Debility 08/30/2021   Osteomyelitis of thoracic region ([place]) 08/06/2021   Essential hypertension 08/06/2021   CKD (chronic kidney disease) stage 3, GFR 30-59 ml/min (HCC) 08/06/2021   Pleural effusion, bilateral 08/06/2021   Diabetic ulcer of left great toe ([place])    MSSA bacteremia 07/23/2021   Constipation 07/23/2021   Lytic bone lesions on xray 07/22/2021   Anemia 05/19/2021   Nausea and vomiting 05/19/2021   Elevated blood pressure reading 05/19/2021   Bilateral carpal tunnel syndrome 01/23/2020   Numbness 12/12/2019   Microalbuminuria due to type 2 diabetes mellitus ([place]) 07/20/2019   GAD (generalized anxiety disorder) 06/18/2019   Adult ADHD 06/18/2019   Trigger middle finger of left hand 06/11/2019   Diabetic retinopathy ([place]) 07/13/2018   Tobacco use disorder 05/17/2018   Hyperlipidemia 05/17/2018   PCP:  [name], NP Pharmacy:   [facility] [place], [place] - [address] AT [facility] [place] [address] Phone: [phone] Fax: [phone]  [facility] [address] [place] [address] Phone: [phone] Fax: [phone]     Social Determinants of Health (SDOH) Interventions    Readmission Risk Interventions    07/30/2021    2:39 PM  Readmission Risk Prevention Plan  Transportation Screening Complete  PCP or Specialist Appt within 3-5 Days Complete  HRI or [place] Complete  Social Work Consult for [place] Planning/Counseling Complete  Palliative Care Screening Complete  Medication Review Press photographer) Complete

## 2022-06-07 NOTE — Progress Notes (Signed)
[facility] Progress Note   Assessment/ Plan:   AKI on CKD 3b - b/l creatinine is 2.1- 2.4 from early 2023, eGFR 33- 38 ml/min. F/b me at [place]. Renal US w/o obstruction, UA +proteinuria o/w unremarkable. Suspect admitted w/ AKI due to sig vol depletion along w/ CKD and needs more fluids, still dry. CKD may be progressing as well. Will cont IVF's at 100 cc/hr until this evening and then stop.   SIRS - all cx's were negative, and ID signed off and IV abx were dc'd.  Hypertensive urgency - improving on home metoprolol+ norvasc with added hydralazine po/ IV. Continue. BP's are well controlled now.  DM2 - long-standing Chronic nausea - h/o gastroparesis H/o thoracic osteomyelitis - ended abx in April.   Subjective:    Seen in room.  Feeling much Better.  Cr inching down   Objective:   BP 127/73 (BP Location: Right Arm)   Pulse 77   Temp 97.9 F (36.6 C) (Oral)   Resp 13   Ht 5' 11"  (1.803 m)   Wt 104.1 kg   SpO2 93%   BMI 32.01 kg/m   Physical Exam: Gen:NAD, lying in bed CVS: RRR Resp: clear Abd: soft Ext: no LE edema  Labs: BMET Recent Labs  Lab 06/02/22 1212 06/03/22 1120 06/04/22 0332 06/05/22 0310 06/06/22 0325 06/07/22 0654  NA 137 139 137 139 139 139  K 4.9 4.5 4.1 3.8 3.8 4.0  CL 101 107 108 110 111 113*  CO2 21* 24 22 22  21* 19*  GLUCOSE 258* 191* 107* 93 94 106*  BUN 51* 41* 40* 42* 40* 36*  CREATININE 3.78* 3.33* 3.56* 3.95* 3.86* 3.77*  CALCIUM 8.9 8.7* 8.2* 8.1* 7.9* 7.9*   CBC Recent Labs  Lab 06/02/22 1212 06/04/22 0332 06/05/22 0310 06/06/22 0325  WBC 17.2* 10.6* 8.5 6.7  HGB 10.5* 8.2* 7.8* 8.1*  HCT 29.9* 24.5* 22.9* 23.1*  MCV 85.7 89.7 88.1 86.8  PLT 298 208 187 186      Medications:     amLODipine  10 mg Oral Daily   bethanechol  10 mg Oral TID   heparin  5,000 Units Subcutaneous Q8H   hydrALAZINE  100 mg Oral Q8H   insulin aspart  0-15 Units Subcutaneous TID WC   insulin aspart  0-5 Units Subcutaneous QHS    insulin glargine-yfgn  15 Units Subcutaneous Daily   metoprolol tartrate  75 mg Oral BID   mirtazapine  15 mg Oral QHS   ondansetron (ZOFRAN) IV  4 mg Intravenous Q6H   PARoxetine  20 mg Oral QHS   polyethylene glycol  17 g Oral Daily   senna-docusate  2 tablet Oral BID   sodium chloride flush  3 mL Intravenous Q12H   tamsulosin  0.4 mg Oral QPC supper    Madelon Lips MD 06/07/2022, 1:39 PM

## 2022-06-08 ENCOUNTER — Other Ambulatory Visit (HOSPITAL_COMMUNITY): Payer: Self-pay

## 2022-06-08 DIAGNOSIS — E1122 Type 2 diabetes mellitus with diabetic chronic kidney disease: Secondary | ICD-10-CM

## 2022-06-08 DIAGNOSIS — N1832 Chronic kidney disease, stage 3b: Secondary | ICD-10-CM

## 2022-06-08 LAB — BASIC METABOLIC PANEL
Anion gap: 7 (ref 5–15)
BUN: 35 mg/dL — ABNORMAL HIGH (ref 6–20)
CO2: 19 mmol/L — ABNORMAL LOW (ref 22–32)
Calcium: 8 mg/dL — ABNORMAL LOW (ref 8.9–10.3)
Chloride: 112 mmol/L — ABNORMAL HIGH (ref 98–111)
Creatinine, Ser: 3.97 mg/dL — ABNORMAL HIGH (ref 0.61–1.24)
GFR, Estimated: 18 mL/min — ABNORMAL LOW (ref >60)
Glucose, Bld: 77 mg/dL (ref 70–99)
Potassium: 4 mmol/L (ref 3.5–5.1)
Sodium: 138 mmol/L (ref 135–145)

## 2022-06-08 LAB — GLUCOSE, CAPILLARY
Glucose-Capillary: 73 mg/dL (ref 70–99)
Glucose-Capillary: 110 mg/dL — ABNORMAL HIGH (ref 70–99)

## 2022-06-08 MED ORDER — HYDRALAZINE HCL 50 MG PO TABS
50.0000 mg | ORAL_TABLET | Freq: Three times a day (TID) | ORAL | 0 refills | Status: DC
  Filled 2022-06-08: qty 150, 50d supply, fill #0

## 2022-06-08 MED ORDER — OXYCODONE HCL 5 MG PO TABS
5.0000 mg | ORAL_TABLET | Freq: Four times a day (QID) | ORAL | 0 refills | Status: DC | PRN
  Filled 2022-06-08: qty 15, 4d supply, fill #0

## 2022-06-08 MED ORDER — OXYCODONE HCL 5 MG PO TABS: 5.0000 mg | ORAL_TABLET | ORAL | Status: DC | PRN

## 2022-06-08 MED ORDER — AMLODIPINE BESYLATE 10 MG PO TABS
10.0000 mg | ORAL_TABLET | Freq: Every day | ORAL | 0 refills | Status: DC
  Filled 2022-06-08: qty 30, 30d supply, fill #0

## 2022-06-08 MED ORDER — AMLODIPINE BESYLATE 10 MG PO TABS: 10.0000 mg | ORAL_TABLET | Freq: Every day | ORAL | 0 refills | Status: DC

## 2022-06-08 MED ORDER — OXYCODONE HCL 5 MG PO TABS
5.0000 mg | ORAL_TABLET | Freq: Four times a day (QID) | ORAL | 0 refills | Status: DC | PRN
Start: 2022-06-08 — End: 2022-08-25

## 2022-06-08 MED ORDER — OXYCODONE HCL 5 MG PO TABS: 10.0000 mg | ORAL_TABLET | ORAL | Status: DC | PRN

## 2022-06-08 MED ORDER — METOPROLOL TARTRATE 75 MG PO TABS
75.0000 mg | ORAL_TABLET | Freq: Two times a day (BID) | ORAL | 0 refills | Status: DC
  Filled 2022-06-08: qty 60, fill #0

## 2022-06-08 MED ORDER — HYDRALAZINE HCL 50 MG PO TABS
50.0000 mg | ORAL_TABLET | Freq: Three times a day (TID) | ORAL | Status: DC
  Administered 2022-06-08: 50 mg via ORAL
  Filled 2022-06-08: qty 1

## 2022-06-08 MED ORDER — METOPROLOL TARTRATE 75 MG PO TABS: 75.0000 mg | ORAL_TABLET | Freq: Two times a day (BID) | ORAL | 0 refills | Status: DC

## 2022-06-08 MED ORDER — HYDRALAZINE HCL 50 MG PO TABS: 50.0000 mg | ORAL_TABLET | Freq: Three times a day (TID) | ORAL | 0 refills | Status: DC

## 2022-06-08 NOTE — TOC Transition Note (Signed)
Transition of Care [facility]) - CM/SW Discharge Note   Patient Details  Name: Jose [name] MRN: [id number] Date of Birth: February 09, 1976  Transition of Care [facility]) CM/SW Contact:  Carles Collet, RN Phone Number: 06/08/2022, 3:03 PM   Clinical Narrative:    [facility] that patient will DC to home today.  No DME needs, no other TOC needs identified for DC.    Final next level of care: [place] Barriers to Discharge: No Barriers Identified   Patient Goals and CMS Choice Patient states their goals for this hospitalization and ongoing recovery are:: return home CMS Medicare.gov Compare Post Acute Care list provided to:: Patient Choice offered to / list presented to : Patient  Discharge Placement                       Discharge Plan and Services   Discharge Planning Services: CM Consult Post Acute Care Choice: Home Health          DME Arranged: N/A         HH Arranged: PT HH Agency: [place] Date [facility] Agency Contacted: 06/08/22 Time [place] Agency Contacted: 1916 Representative spoke with at [place]: [place] ([place]) Interventions     Readmission Risk Interventions    07/30/2021    2:39 PM  Readmission Risk Prevention Plan  Transportation Screening Complete  PCP or Specialist Appt within 3-5 Days Complete  HRI or [place] Complete  Social Work Consult for [place] Planning/Counseling Complete  Palliative Care Screening Complete  Medication Review Press photographer) Complete

## 2022-06-08 NOTE — Progress Notes (Signed)
[facility] Progress Note   Assessment/ Plan:   AKI on CKD 3b - b/l creatinine is 2.1- 2.4 from early 2023, eGFR 33- 38 ml/min. F/b me at [place]. Renal US w/o obstruction, UA +proteinuria o/w unremarkable. Suspect admitted w/ AKI due to sig vol depletion along w/ CKD   - Cr basically stable off fluids  - feeling much better- OK for d/c from renal perspective  - will arrange labs next week and then has a followup with me SIRS - all cx's were negative, and ID signed off and IV abx were dc'd.  Hypertensive urgency - improving on home metoprolol+ norvasc with added hydralazine po/ IV. Continue. BP's are well controlled now.  DM2 - long-standing Chronic nausea - h/o gastroparesis H/o thoracic osteomyelitis - ended abx in April.   Subjective:    Off fluids- Cr 3.97 but feeling much better.  No n/v, able to keep everything down   Objective:   BP (!) 147/82 (BP Location: Right Arm)   Pulse 68   Temp 98.6 F (37 C) (Oral)   Resp 18   Ht 5' 11"  (1.803 m)   Wt 106 kg   SpO2 91%   BMI 32.59 kg/m   Physical Exam: Gen:NAD, lying in bed CVS: RRR Resp: clear Abd: soft Ext: no LE edema  Labs: BMET Recent Labs  Lab 06/02/22 1212 06/03/22 1120 06/04/22 0332 06/05/22 0310 06/06/22 0325 06/07/22 0654 06/08/22 0451  NA 137 139 137 139 139 139 138  K 4.9 4.5 4.1 3.8 3.8 4.0 4.0  CL 101 107 108 110 111 113* 112*  CO2 21* 24 22 22  21* 19* 19*  GLUCOSE 258* 191* 107* 93 94 106* 77  BUN 51* 41* 40* 42* 40* 36* 35*  CREATININE 3.78* 3.33* 3.56* 3.95* 3.86* 3.77* 3.97*  CALCIUM 8.9 8.7* 8.2* 8.1* 7.9* 7.9* 8.0*   CBC Recent Labs  Lab 06/02/22 1212 06/04/22 0332 06/05/22 0310 06/06/22 0325  WBC 17.2* 10.6* 8.5 6.7  HGB 10.5* 8.2* 7.8* 8.1*  HCT 29.9* 24.5* 22.9* 23.1*  MCV 85.7 89.7 88.1 86.8  PLT 298 208 187 186      Medications:     amLODipine  10 mg Oral Daily   bethanechol  10 mg Oral TID   heparin  5,000 Units Subcutaneous Q8H   hydrALAZINE  50 mg Oral  Q8H   insulin aspart  0-15 Units Subcutaneous TID WC   insulin aspart  0-5 Units Subcutaneous QHS   insulin glargine-yfgn  15 Units Subcutaneous Daily   metoprolol tartrate  75 mg Oral BID   mirtazapine  15 mg Oral QHS   ondansetron (ZOFRAN) IV  4 mg Intravenous Q6H   PARoxetine  20 mg Oral QHS   polyethylene glycol  17 g Oral Daily   senna-docusate  2 tablet Oral BID   sodium chloride flush  3 mL Intravenous Q12H   tamsulosin  0.4 mg Oral QPC supper    Madelon Lips MD 06/08/2022, 2:27 PM

## 2022-06-08 NOTE — Discharge Instructions (Signed)
Follow with Primary MD [name], NP in 7 days   Get CBC, CMP, checked  by Primary MD next visit.    Activity: As tolerated with Full fall precautions use walker/cane & assistance as needed   Disposition Home    Diet: Heart Healthy /carb modified    On your next visit with your primary care physician please Get Medicines reviewed and adjusted.   Please request your Prim.MD to go over all Hospital Tests and Procedure/Radiological results at the follow up, please get all Hospital records sent to your Prim MD by signing hospital release before you go home.   If you experience worsening of your admission symptoms, develop shortness of breath, life threatening emergency, suicidal or homicidal thoughts you must seek medical attention immediately by calling 911 or calling your MD immediately  if symptoms less severe.  You Must read complete instructions/literature along with all the possible adverse reactions/side effects for all the Medicines you take and that have been prescribed to you. Take any new Medicines after you have completely understood and accpet all the possible adverse reactions/side effects.   Do not drive, operating heavy machinery, perform activities at heights, swimming or participation in water activities or provide baby sitting services if your were admitted for syncope or siezures until you have seen by Primary MD or a Neurologist and advised to do so again.  Do not drive when taking Pain medications.    Do not take more than prescribed Pain, Sleep and Anxiety Medications  Special Instructions: If you have smoked or chewed Tobacco  in the last 2 yrs please stop smoking, stop any regular Alcohol  and or any Recreational drug use.  Wear Seat belts while driving.   Please note  You were cared for by a hospitalist during your hospital stay. If you have any questions about your discharge medications or the care you received while you were in the hospital after  you are discharged, you can call the unit and asked to speak with the hospitalist on call if the hospitalist that took care of you is not available. Once you are discharged, your primary care physician will handle any further medical issues. Please note that NO REFILLS for any discharge medications will be authorized once you are discharged, as it is imperative that you return to your primary care physician (or establish a relationship with a primary care physician if you do not have one) for your aftercare needs so that they can reassess your need for medications and monitor your lab values.

## 2022-06-08 NOTE — Progress Notes (Signed)
TRH night cross cover note:  Patient conveys that prn oxycodone 10 mg PO q4h prn is ineffective as relates to pain control from his back discomfort, with patient specifically requesting the prn oxycodone be changed prn IV morphine.  I discontinued prn oxycodone, and ordered morphine 4 mg IV every 3 hours as needed per patient request.     Babs Bertin, DO Hospitalist

## 2022-06-08 NOTE — Discharge Summary (Signed)
Physician Discharge Summary  Jose [name] WUJ:[id number] DOB: April 19, 1976 DOA: 06/02/2022  PCP: [name], NP  Admit date: 06/02/2022 Discharge date: 06/08/2022  Admitted From: Home Disposition:  Home  Recommendations for Outpatient Follow-up:  Follow up with PCP in 1-2 weeks Please obtain BMP/CBC in one week   Home Health:HH PT   Discharge Condition:Stable CODE STATUS:FULL Diet recommendation: Heart Healthy / Carb Modified   Brief/Interim Summary:  SIRS -Sent was admitted to due to initial concern for recurrent osteomyelitis, ID input greatly appreciated, imaging significant for chronic osteomyelitis, no acute infection or findings, as well I have discussed imaging findings neurosurgery on-call, no acute concerns. -Blood culture sent on 8/9 remains negative, so antibiotics has been discontinued and patient was monitored off antibiotics  -CRP within normal limit which is reassuring -There is no concern of recurrent thoracic osteomyelitis or evidence of infection this admission   Hypertensive urgency Acute.On admission blood pressures had initially been seen elevated up to 220/124.  Home blood pressure regimen includes metoprolol 50 mg twice daily, furosemide 20 mg daily, and amlodipine 5 mg daily. -Patient reports blood pressure usually elevated at home with systolic being in the 956O to 170s -His medications has been adjusted, as his blood pressure remains significantly elevated, will be discharged with metoprolol 75 mg oral twice daily, hydralazine 50 mg oral 3 times daily, and amlodipine 10 mg oral daily.    Acute kidney injury superimposed on chronic kidney disease stage IIIb Patient presented with elevated creatinine, baseline 2-2.5, creatinine peaked at 3.9, it remains 3.9 on discharge -Renal input greatly appreciated, this is related to volume depletion, and some element of progression of his renal disease.   -No evidence of retention as postvoid scan is 0  -UA on  admission significant for proteinuria -Renal ultrasound significant for suggested medical renal disease with mild increased cortical echogenicity of the kidneys -Creatinine remains 3.9 on discharge this is likely will be close to his new baseline -Lasix has been continued on discharge  Nausea and vomiting Patient reported having nausea and vomiting for which she was unable to tolerate any food or liquids over the last 4 days. -Resolved   Diabetes mellitus, type II, type II, with insulin use, with renal complication -Z3Y has improved, there is 7.8 this admission, no change in his home regimen   Anemia of chronic kidney disease -Continue to monitor   Anxiety  -Continue home regimen of Paxil and hydroxyzine   Discharge Diagnoses:  Principal Problem:   SIRS (systemic inflammatory response syndrome) ([place]) Active Problems:   Vertebral osteomyelitis (HCC)   Hypertensive urgency   Acute kidney injury superimposed on chronic kidney disease (HCC)   Nausea and vomiting   Anemia   GAD (generalized anxiety disorder)   Type 2 diabetes mellitus with chronic kidney disease, with long-term current use of insulin ([place])    Discharge Instructions  Discharge Instructions     Diet - low sodium heart healthy   Complete by: As directed    Discharge instructions   Complete by: As directed    Follow with Primary MD [name], NP in 7 days   Get CBC, CMP, checked  by Primary MD next visit.    Activity: As tolerated with Full fall precautions use walker/cane & assistance as needed   Disposition Home    Diet: Heart Healthy /carb modified    On your next visit with your primary care physician please Get Medicines reviewed and adjusted.   Please request your Prim.MD to go  over all Hospital Tests and Procedure/Radiological results at the follow up, please get all Hospital records sent to your Prim MD by signing hospital release before you go home.   If you experience worsening of  your admission symptoms, develop shortness of breath, life threatening emergency, suicidal or homicidal thoughts you must seek medical attention immediately by calling 911 or calling your MD immediately  if symptoms less severe.  You Must read complete instructions/literature along with all the possible adverse reactions/side effects for all the Medicines you take and that have been prescribed to you. Take any new Medicines after you have completely understood and accpet all the possible adverse reactions/side effects.   Do not drive, operating heavy machinery, perform activities at heights, swimming or participation in water activities or provide baby sitting services if your were admitted for syncope or siezures until you have seen by Primary MD or a Neurologist and advised to do so again.  Do not drive when taking Pain medications.    Do not take more than prescribed Pain, Sleep and Anxiety Medications  Special Instructions: If you have smoked or chewed Tobacco  in the last 2 yrs please stop smoking, stop any regular Alcohol  and or any Recreational drug use.  Wear Seat belts while driving.   Please note  You were cared for by a hospitalist during your hospital stay. If you have any questions about your discharge medications or the care you received while you were in the hospital after you are discharged, you can call the unit and asked to speak with the hospitalist on call if the hospitalist that took care of you is not available. Once you are discharged, your primary care physician will handle any further medical issues. Please note that NO REFILLS for any discharge medications will be authorized once you are discharged, as it is imperative that you return to your primary care physician (or establish a relationship with a primary care physician if you do not have one) for your aftercare needs so that they can reassess your need for medications and monitor your lab values.   Increase activity  slowly   Complete by: As directed       Allergies as of 06/08/2022       Reactions   Cranberry Itching   Hm Lidocaine Patch [lidocaine] Dermatitis   Blisters skin    Melatonin Other (See Comments)   nightmares   Nsaids Other (See Comments)   Stage 3 kidney disease        Medication List     STOP taking these medications    furosemide 20 MG tablet Commonly known as: LASIX   Toujeo SoloStar 300 UNIT/ML Solostar Pen Generic drug: insulin glargine (1 Unit Dial)       TAKE these medications    Acetaminophen 500 MG capsule Take 500-1,000 mg by mouth every 6 (six) hours as needed for mild pain.   albuterol 108 (90 Base) MCG/ACT inhaler Commonly known as: VENTOLIN HFA Inhale 1 puff into the lungs every 4 (four) hours as needed for wheezing or shortness of breath.   amLODipine 10 MG tablet Commonly known as: NORVASC Take 1 tablet (10 mg total) by mouth daily. Start taking on: June 09, 2022 What changed:  medication strength how much to take   atorvastatin 40 MG tablet Commonly known as: LIPITOR Take 40 mg by mouth daily.   bethanechol 10 MG tablet Commonly known as: URECHOLINE Take 1 tablet (10 mg total) by mouth 3 (three) times  daily.   cholecalciferol 25 MCG (1000 UNIT) tablet Commonly known as: VITAMIN D3 Take 1,000 Units by mouth daily.   FreeStyle Libre 3 Sensor Misc 1 Device by Does not apply route every 14 (fourteen) days. Apply 1 sensor on upper arm every 14 days for continuous glucose monitoring   gabapentin 100 MG capsule Commonly known as: NEURONTIN Take 2 capsules (200 mg total) by mouth 2 (two) times daily. What changed: how much to take   hydrALAZINE 50 MG tablet Commonly known as: APRESOLINE Take 1 tablet (50 mg total) by mouth every 8 (eight) hours.   hydrOXYzine 25 MG tablet Commonly known as: ATARAX Take 1 tablet (25 mg total) by mouth 3 (three) times daily as needed for anxiety or nausea.   insulin NPH-regular Human (70-30) 100  UNIT/ML injection Inject 20 Units into the skin daily with breakfast.   Metoprolol Tartrate 75 MG Tabs Take 75 mg by mouth 2 (two) times daily. What changed:  medication strength how much to take   mirtazapine 15 MG tablet Commonly known as: REMERON Take 1 tablet (15 mg total) by mouth at bedtime.   Narcan 4 MG/0.1ML Liqd nasal spray kit Generic drug: naloxone Use as needed for signs of overdose.   ondansetron 4 MG disintegrating tablet Commonly known as: ZOFRAN-ODT Take 4 mg by mouth daily.   oxyCODONE 5 MG immediate release tablet Commonly known as: Oxy IR/ROXICODONE Take 1 tablet (5 mg total) by mouth every 6 (six) hours as needed for severe pain or breakthrough pain.   PARoxetine 20 MG tablet Commonly known as: PAXIL Take 1 tablet (20 mg total) by mouth at bedtime.   Pentips 32G X 4 MM Misc Generic drug: Insulin Pen Needle use as directed   polyethylene glycol 17 g packet Commonly known as: MIRALAX / GLYCOLAX Take 17 g by mouth daily as needed for moderate constipation, mild constipation or severe constipation.   tamsulosin 0.4 MG Caps capsule Commonly known as: FLOMAX Take 1 capsule (0.4 mg total) by mouth daily after supper.   tirzepatide 5 MG/0.5ML Pen Commonly known as: MOUNJARO Inject 5 mg into the skin once a week.   tobramycin 0.3 % ophthalmic solution Commonly known as: TOBREX Place 1 drop into both eyes as directed. Retina injections        Follow-up Information     [facility], [facility] Follow up.   Specialty: Home Health Services Why: Home Health arranged. They will contact you within 1 to 2 days after discharge. Contact information: [address] [place] [address] [phone]                Allergies  Allergen Reactions   Cranberry Itching   Hm Lidocaine Patch [Lidocaine] Dermatitis    Blisters skin    Melatonin Other (See Comments)    nightmares   Nsaids Other (See Comments)    Stage 3 kidney disease     Consultations: Renal ID D/W neurosurgery by phone   Procedures/Studies: CT HEAD WO CONTRAST (5MM)  Result Date: 06/05/2022 CLINICAL DATA:  Weakness and dizziness. EXAM: CT HEAD WITHOUT CONTRAST TECHNIQUE: Contiguous axial images were obtained from the base of the skull through the vertex without intravenous contrast. RADIATION DOSE REDUCTION: This exam was performed according to the departmental dose-optimization program which includes automated exposure control, adjustment of the mA and/or kV according to patient size and/or use of iterative reconstruction technique. COMPARISON:  06/02/2022 FINDINGS: Brain: No evidence of intracranial hemorrhage, acute infarction, hydrocephalus, extra-axial collection,  or mass lesion/mass effect. Vascular:  No hyperdense vessel or other acute findings. Skull: No evidence of fracture or other significant bone abnormality. Sinuses/Orbits:  No acute findings. Other: None. IMPRESSION: Negative noncontrast head CT. Electronically Signed   By: Marlaine Hind M.D.   On: 06/05/2022 18:57   US RENAL  Result Date: 06/05/2022 CLINICAL DATA:  Renal failure. EXAM: RENAL / URINARY TRACT ULTRASOUND COMPLETE COMPARISON:  None Available. FINDINGS: Right Kidney: Renal measurements: 12.3 x 5.9 x 5.1 cm = volume: 209 mL. Mild increased cortical echogenicity. Left Kidney: Renal measurements: 12.8 x 6.4 x 5.6 cm = volume: 239 mL. Mild increased cortical echogenicity. Bladder: The bladder is unremarkable. Other: None. IMPRESSION: Suggested medical renal disease with mild increased cortical echogenicity in the kidneys. No other abnormalities. Electronically Signed   By: Dorise Bullion III M.D.   On: 06/05/2022 09:50   MR THORACIC SPINE W WO CONTRAST  Result Date: 06/03/2022 CLINICAL DATA:  Upper back pain, history of osteomyelitis EXAM: MRI CERVICAL AND THORACIC SPINE WITHOUT AND WITH CONTRAST TECHNIQUE: Multiplanar and multiecho pulse sequences of the cervical spine, to include the  craniocervical junction and cervicothoracic junction, and the thoracic spine, were obtained without and with intravenous contrast. CONTRAST:  9.55m GADAVIST GADOBUTROL 1 MMOL/ML IV SOLN COMPARISON:  Thoracic spine MRI 10/27/2021 FINDINGS: MRI CERVICAL SPINE Alignment: Mild retrolisthesis at C4-C5. Vertebrae: Minor degenerative endplate irregularity. No marrow edema. No suspicious osseous lesion. In Cord: No abnormal signal.  No abnormal intrathecal enhancement. Posterior Fossa, vertebral arteries, paraspinal tissues: Unremarkable. Disc levels: C2-C3:  No canal or foraminal stenosis. C3-C4: Disc bulge with endplate osteophytes. Uncovertebral hypertrophy. Mild canal stenosis. Mild foraminal stenosis. C4-C5: Disc bulge with superimposed central protrusion endplate osteophytes. Moderate canal stenosis. No foraminal stenosis. C5-C6: Disc bulge with superimposed left foraminal protrusion and endplate osteophytes. Uncovertebral hypertrophy. Moderate canal stenosis. No right foraminal stenosis. Moderate to marked left foraminal stenosis. C6-C7:  Disc bulge.  No canal or foraminal stenosis. C7-T1:  No canal or foraminal stenosis. MRI THORACIC SPINE Alignment:  Stable. Vertebrae: Sequelae of discitis/osteomyelitis at T4-T5 with severe T5 collapse and partial fusion across the disc space. Persistent but substantially decreased marrow edema and enhancement. Postoperative changes are present at this level with susceptibility artifact and posterior decompression. There remains canal stenosis due to osseous retropulsion with possible cord deformity. Cord:  As above.  Otherwise unremarkable. Paraspinal and other soft tissues: Chronic postoperative changes. Disc levels: No new degenerative canal or foraminal stenosis. IMPRESSION: Sequelae of discitis/osteomyelitis at T4-T5 with persistent but substantially decreased marrow edema and enhancement. No paraspinal edema or enhancement. FBurtis Junesthis represents chronic changes rather than  recurrent disease. There is likely similar canal stenosis at this level due to osseous retropulsion. No evidence of discitis/osteomyelitis elsewhere. Cervical spine degenerative changes as detailed above. Electronically Signed   By: PMacy MisM.D.   On: 06/03/2022 09:39   MR Cervical Spine W or Wo Contrast  Result Date: 06/03/2022 CLINICAL DATA:  Upper back pain, history of osteomyelitis EXAM: MRI CERVICAL AND THORACIC SPINE WITHOUT AND WITH CONTRAST TECHNIQUE: Multiplanar and multiecho pulse sequences of the cervical spine, to include the craniocervical junction and cervicothoracic junction, and the thoracic spine, were obtained without and with intravenous contrast. CONTRAST:  9.554mGADAVIST GADOBUTROL 1 MMOL/ML IV SOLN COMPARISON:  Thoracic spine MRI 10/27/2021 FINDINGS: MRI CERVICAL SPINE Alignment: Mild retrolisthesis at C4-C5. Vertebrae: Minor degenerative endplate irregularity. No marrow edema. No suspicious osseous lesion. In Cord: No abnormal signal.  No abnormal intrathecal enhancement. Posterior  Fossa, vertebral arteries, paraspinal tissues: Unremarkable. Disc levels: C2-C3:  No canal or foraminal stenosis. C3-C4: Disc bulge with endplate osteophytes. Uncovertebral hypertrophy. Mild canal stenosis. Mild foraminal stenosis. C4-C5: Disc bulge with superimposed central protrusion endplate osteophytes. Moderate canal stenosis. No foraminal stenosis. C5-C6: Disc bulge with superimposed left foraminal protrusion and endplate osteophytes. Uncovertebral hypertrophy. Moderate canal stenosis. No right foraminal stenosis. Moderate to marked left foraminal stenosis. C6-C7:  Disc bulge.  No canal or foraminal stenosis. C7-T1:  No canal or foraminal stenosis. MRI THORACIC SPINE Alignment:  Stable. Vertebrae: Sequelae of discitis/osteomyelitis at T4-T5 with severe T5 collapse and partial fusion across the disc space. Persistent but substantially decreased marrow edema and enhancement. Postoperative changes are  present at this level with susceptibility artifact and posterior decompression. There remains canal stenosis due to osseous retropulsion with possible cord deformity. Cord:  As above.  Otherwise unremarkable. Paraspinal and other soft tissues: Chronic postoperative changes. Disc levels: No new degenerative canal or foraminal stenosis. IMPRESSION: Sequelae of discitis/osteomyelitis at T4-T5 with persistent but substantially decreased marrow edema and enhancement. No paraspinal edema or enhancement. Burtis Junes this represents chronic changes rather than recurrent disease. There is likely similar canal stenosis at this level due to osseous retropulsion. No evidence of discitis/osteomyelitis elsewhere. Cervical spine degenerative changes as detailed above. Electronically Signed   By: Macy Mis M.D.   On: 06/03/2022 09:39   MR Lumbar Spine W Wo Contrast  Result Date: 06/03/2022 CLINICAL DATA:  Back pain, nausea and vomiting. History of treated osteomyelitis. EXAM: MRI LUMBAR SPINE WITHOUT AND WITH CONTRAST TECHNIQUE: Multiplanar and multiecho pulse sequences of the lumbar spine were obtained without and with intravenous contrast. CONTRAST:  9.48m GADAVIST GADOBUTROL 1 MMOL/ML IV SOLN COMPARISON:  07/23/2021 FINDINGS: Segmentation: There are five lumbar type vertebral bodies. The last full intervertebral disc space is labeled L5-S1. This correlates with the prior MRI examination. Alignment:  Normal Vertebrae: Normal marrow signal. No bone lesions or fractures. No findings to suggest discitis or osteomyelitis in the lumbar spine. The facets are normally aligned. No pars defects or evidence of septic arthritis involving the facet joints. Conus medullaris and cauda equina: Conus extends to the L1-2 level. Conus and cauda equina appear normal. Paraspinal and other soft tissues: No significant paraspinal or retroperitoneal findings. No inflammation/edema or abscess in the psoas muscles. Disc levels: No lumbar disc  protrusions, spinal foraminal stenosis. IMPRESSION: Unremarkable lumbar spine MRI examination. No findings to suggest discitis or osteomyelitis. Electronically Signed   By: PMarijo SanesM.D.   On: 06/03/2022 09:34   CT Head Wo Contrast  Result Date: 06/02/2022 CLINICAL DATA:  Headache, new or worsening (Age >= 50y) EXAM: CT HEAD WITHOUT CONTRAST TECHNIQUE: Contiguous axial images were obtained from the base of the skull through the vertex without intravenous contrast. RADIATION DOSE REDUCTION: This exam was performed according to the departmental dose-optimization program which includes automated exposure control, adjustment of the mA and/or kV according to patient size and/or use of iterative reconstruction technique. COMPARISON:  CT head 08/11/2021 FINDINGS: Brain: No evidence of large-territorial acute infarction. No parenchymal hemorrhage. No mass lesion. No extra-axial collection. No mass effect or midline shift. No hydrocephalus. Basilar cisterns are patent. Vascular: No hyperdense vessel. Skull: No acute fracture or focal lesion. Sinuses/Orbits: Paranasal sinuses and mastoid air cells are clear. The orbits are unremarkable. Other: None. IMPRESSION: No acute intracranial abnormality. Electronically Signed   By: MIven FinnM.D.   On: 06/02/2022 20:04   DG Chest 1 View  Result Date:  06/02/2022 CLINICAL DATA:  Cough, vomiting EXAM: CHEST  1 VIEW COMPARISON:  09/07/2021 FINDINGS: Transverse diameter of the heart is slightly increased. There are no signs of pulmonary edema or focal pulmonary consolidation. There is no pleural effusion or pneumothorax. IMPRESSION: No active disease. Electronically Signed   By: Elmer Picker M.D.   On: 06/02/2022 12:39      Subjective:  No significant events overnight, he denies any complaints Discharge Exam: Vitals:   06/08/22 0806 06/08/22 1215  BP: 133/69 (!) 147/82  Pulse: 71 68  Resp: 20 18  Temp: 98.3 F (36.8 C) 98.6 F (37 C)  SpO2: 91%     Vitals:   06/08/22 0500 06/08/22 0509 06/08/22 0806 06/08/22 1215  BP:  128/72 133/69 (!) 147/82  Pulse:   71 68  Resp:   20 18  Temp:   98.3 F (36.8 C) 98.6 F (37 C)  TempSrc:   Oral Oral  SpO2:   91%   Weight: 106 kg     Height:        General: Pt is alert, awake, not in acute distress Cardiovascular: RRR, S1/S2 +, no rubs, no gallops Respiratory: CTA bilaterally, no wheezing, no rhonchi Abdominal: Soft, NT, ND, bowel sounds + Extremities: no edema, no cyanosis    The results of significant diagnostics from this hospitalization (including imaging, microbiology, ancillary and laboratory) are listed below for reference.     Microbiology: Recent Results (from the past 240 hour(s))  SARS Coronavirus 2 by RT PCR (hospital order, performed in [facility] hospital lab) *cepheid single result test* Anterior Nasal Swab     Status: None   Collection Time: 06/02/22 12:13 PM   Specimen: Anterior Nasal Swab  Result Value Ref Range Status   SARS Coronavirus 2 by RT PCR NEGATIVE NEGATIVE Final    Comment: (NOTE) SARS-CoV-2 target nucleic acids are NOT DETECTED.  The SARS-CoV-2 RNA is generally detectable in upper and lower respiratory specimens during the acute phase of infection. The lowest concentration of SARS-CoV-2 viral copies this assay can detect is 250 copies / mL. A negative result does not preclude SARS-CoV-2 infection and should not be used as the sole basis for treatment or other patient management decisions.  A negative result may occur with improper specimen collection / handling, submission of specimen other than nasopharyngeal swab, presence of viral mutation(s) within the areas targeted by this assay, and inadequate number of viral copies (<250 copies / mL). A negative result must be combined with clinical observations, patient history, and epidemiological information.  Fact Sheet for Patients:   https://www.patel.info/  Fact Sheet for  Healthcare Providers: https://hall.com/  This test is not yet approved or  cleared by the [place] FDA and has been authorized for detection and/or diagnosis of SARS-CoV-2 by FDA under an Emergency Use Authorization (EUA).  This EUA will remain in effect (meaning this test can be used) for the duration of the COVID-19 declaration under Section 564(b)(1) of the Act, 21 U.S.C. section 360bbb-3(b)(1), unless the authorization is terminated or revoked sooner.  Performed at [facility], [address] [place], [place] [address]   Resp Panel by RT-PCR (Flu A&B, Covid) Anterior Nasal Swab     Status: None   Collection Time: 06/02/22  3:18 PM   Specimen: Anterior Nasal Swab  Result Value Ref Range Status   SARS Coronavirus 2 by RT PCR NEGATIVE NEGATIVE Final    Comment: (NOTE) SARS-CoV-2 target nucleic acids are NOT DETECTED.  The SARS-CoV-2  RNA is generally detectable in upper respiratory specimens during the acute phase of infection. The lowest concentration of SARS-CoV-2 viral copies this assay can detect is 138 copies/mL. A negative result does not preclude SARS-Cov-2 infection and should not be used as the sole basis for treatment or other patient management decisions. A negative result may occur with  improper specimen collection/handling, submission of specimen other than nasopharyngeal swab, presence of viral mutation(s) within the areas targeted by this assay, and inadequate number of viral copies(<138 copies/mL). A negative result must be combined with clinical observations, patient history, and epidemiological information. The expected result is Negative.  Fact Sheet for Patients:  EntrepreneurPulse.com.au  Fact Sheet for Healthcare Providers:  IncredibleEmployment.be  This test is no t yet approved or cleared by the [place] FDA and  has been authorized for detection and/or  diagnosis of SARS-CoV-2 by FDA under an Emergency Use Authorization (EUA). This EUA will remain  in effect (meaning this test can be used) for the duration of the COVID-19 declaration under Section 564(b)(1) of the Act, 21 U.S.C.section 360bbb-3(b)(1), unless the authorization is terminated  or revoked sooner.       Influenza A by PCR NEGATIVE NEGATIVE Final   Influenza B by PCR NEGATIVE NEGATIVE Final    Comment: (NOTE) The Xpert Xpress SARS-CoV-2/FLU/RSV plus assay is intended as an aid in the diagnosis of influenza from Nasopharyngeal swab specimens and should not be used as a sole basis for treatment. Nasal washings and aspirates are unacceptable for Xpert Xpress SARS-CoV-2/FLU/RSV testing.  Fact Sheet for Patients: EntrepreneurPulse.com.au  Fact Sheet for Healthcare Providers: IncredibleEmployment.be  This test is not yet approved or cleared by the [place] FDA and has been authorized for detection and/or diagnosis of SARS-CoV-2 by FDA under an Emergency Use Authorization (EUA). This EUA will remain in effect (meaning this test can be used) for the duration of the COVID-19 declaration under Section 564(b)(1) of the Act, 21 U.S.C. section 360bbb-3(b)(1), unless the authorization is terminated or revoked.  Performed at [facility], [address] [place], [place] [address]   Blood Culture (routine x 2)     Status: None   Collection Time: 06/02/22  3:18 PM   Specimen: BLOOD RIGHT HAND  Result Value Ref Range Status   Specimen Description   Final    BLOOD RIGHT HAND Performed at [facility], [address] [place], [place] [address]    Special Requests   Final    BOTTLES DRAWN AEROBIC AND ANAEROBIC Blood Culture adequate volume Performed at [facility], [address], [place], [place] [address]    Culture   Final    NO GROWTH 5 DAYS Performed at [facility], [place] [address] [place], [place] [address]    Report Status 06/07/2022 FINAL  Final  Blood Culture (routine x 2)     Status: None   Collection Time: 06/02/22  3:56 PM   Specimen: BLOOD  Result Value Ref Range Status   Specimen Description   Final    BLOOD BLOOD LEFT ARM Performed at [facility], [address] [place], [place] [address]    Special Requests   Final    Blood Culture adequate volume BOTTLES DRAWN AEROBIC AND ANAEROBIC Performed at [facility], [address], [place], [place] [address]    Culture   Final    NO GROWTH 5 DAYS Performed at [facility], [place] [address] [place], [place] [address]  Report Status 06/07/2022 FINAL  Final  Urine Culture     Status: None   Collection Time: 06/02/22  6:32 PM   Specimen: In/Out Cath Urine  Result Value Ref Range Status   Specimen Description   Final    IN/OUT CATH URINE Performed at [facility], [address] [place], [place] [address]    Special Requests   Final    NONE Performed at [facility], [address], [place], [place] [address]    Culture   Final    NO GROWTH Performed at [facility], [place] [address] [place], [place] [address]    Report Status 06/04/2022 FINAL  Final     Labs: BNP (last 3 results) No results for input(s): "BNP" in the last 8760 hours. Basic Metabolic Panel: Recent Labs  Lab 06/04/22 0332 06/05/22 0310 06/06/22 0325 06/07/22 0654 06/08/22 0451  NA 137 139 139 139 138  K 4.1 3.8 3.8 4.0 4.0  CL 108 110 111 113* 112*  CO2 22 22 21* 19* 19*  GLUCOSE 107* 93 94 106* 77  BUN 40* 42* 40* 36* 35*  CREATININE 3.56* 3.95* 3.86* 3.77* 3.97*  CALCIUM 8.2* 8.1* 7.9* 7.9* 8.0*   Liver Function Tests: Recent Labs  Lab 06/02/22 1212  AST 16  ALT 10  ALKPHOS 138*  BILITOT 0.7  PROT 7.5  ALBUMIN 4.2   Recent Labs  Lab 06/02/22 1212  LIPASE 11   No results for input(s):  "AMMONIA" in the last 168 hours. CBC: Recent Labs  Lab 06/02/22 1212 06/04/22 0332 06/05/22 0310 06/06/22 0325  WBC 17.2* 10.6* 8.5 6.7  HGB 10.5* 8.2* 7.8* 8.1*  HCT 29.9* 24.5* 22.9* 23.1*  MCV 85.7 89.7 88.1 86.8  PLT 298 208 187 186   Cardiac Enzymes: Recent Labs  Lab 06/05/22 0310  CKTOTAL 97   BNP: Invalid input(s): "POCBNP" CBG: Recent Labs  Lab 06/07/22 1142 06/07/22 1611 06/07/22 2116 06/08/22 0805 06/08/22 1215  GLUCAP 134* 137* 76 73 110*   D-Dimer No results for input(s): "DDIMER" in the last 72 hours. Hgb A1c No results for input(s): "HGBA1C" in the last 72 hours. Lipid Profile No results for input(s): "CHOL", "HDL", "LDLCALC", "TRIG", "CHOLHDL", "LDLDIRECT" in the last 72 hours. Thyroid function studies No results for input(s): "TSH", "T4TOTAL", "T3FREE", "THYROIDAB" in the last 72 hours.  Invalid input(s): "FREET3" Anemia work up No results for input(s): "VITAMINB12", "FOLATE", "FERRITIN", "TIBC", "IRON", "RETICCTPCT" in the last 72 hours. Urinalysis    Component Value Date/Time   COLORURINE YELLOW 06/02/2022 1832   APPEARANCEUR CLEAR 06/02/2022 1832   APPEARANCEUR Clear 05/05/2018 1211   LABSPEC 1.016 06/02/2022 1832   PHURINE 6.5 06/02/2022 1832   GLUCOSEU 500 (A) 06/02/2022 1832   HGBUR MODERATE (A) 06/02/2022 1832   BILIRUBINUR NEGATIVE 06/02/2022 1832   BILIRUBINUR negative 09/12/2018 0919   BILIRUBINUR Negative 05/05/2018 1211   KETONESUR TRACE (A) 06/02/2022 1832   PROTEINUR >300 (A) 06/02/2022 1832   UROBILINOGEN 1.0 09/12/2018 0919   NITRITE NEGATIVE 06/02/2022 1832   LEUKOCYTESUR NEGATIVE 06/02/2022 1832   Sepsis Labs Recent Labs  Lab 06/02/22 1212 06/04/22 0332 06/05/22 0310 06/06/22 0325  WBC 17.2* 10.6* 8.5 6.7   Microbiology Recent Results (from the past 240 hour(s))  SARS Coronavirus 2 by RT PCR (hospital order, performed in [facility] hospital lab) *cepheid single result test* Anterior Nasal Swab     Status:  None   Collection Time: 06/02/22 12:13 PM   Specimen: Anterior  Nasal Swab  Result Value Ref Range Status   SARS Coronavirus 2 by RT PCR NEGATIVE NEGATIVE Final    Comment: (NOTE) SARS-CoV-2 target nucleic acids are NOT DETECTED.  The SARS-CoV-2 RNA is generally detectable in upper and lower respiratory specimens during the acute phase of infection. The lowest concentration of SARS-CoV-2 viral copies this assay can detect is 250 copies / mL. A negative result does not preclude SARS-CoV-2 infection and should not be used as the sole basis for treatment or other patient management decisions.  A negative result may occur with improper specimen collection / handling, submission of specimen other than nasopharyngeal swab, presence of viral mutation(s) within the areas targeted by this assay, and inadequate number of viral copies (<250 copies / mL). A negative result must be combined with clinical observations, patient history, and epidemiological information.  Fact Sheet for Patients:   https://www.patel.info/  Fact Sheet for Healthcare Providers: https://hall.com/  This test is not yet approved or  cleared by the [place] FDA and has been authorized for detection and/or diagnosis of SARS-CoV-2 by FDA under an Emergency Use Authorization (EUA).  This EUA will remain in effect (meaning this test can be used) for the duration of the COVID-19 declaration under Section 564(b)(1) of the Act, 21 U.S.C. section 360bbb-3(b)(1), unless the authorization is terminated or revoked sooner.  Performed at [facility], [address] [place], [place] [address]   Resp Panel by RT-PCR (Flu A&B, Covid) Anterior Nasal Swab     Status: None   Collection Time: 06/02/22  3:18 PM   Specimen: Anterior Nasal Swab  Result Value Ref Range Status   SARS Coronavirus 2 by RT PCR NEGATIVE NEGATIVE Final    Comment: (NOTE) SARS-CoV-2 target  nucleic acids are NOT DETECTED.  The SARS-CoV-2 RNA is generally detectable in upper respiratory specimens during the acute phase of infection. The lowest concentration of SARS-CoV-2 viral copies this assay can detect is 138 copies/mL. A negative result does not preclude SARS-Cov-2 infection and should not be used as the sole basis for treatment or other patient management decisions. A negative result may occur with  improper specimen collection/handling, submission of specimen other than nasopharyngeal swab, presence of viral mutation(s) within the areas targeted by this assay, and inadequate number of viral copies(<138 copies/mL). A negative result must be combined with clinical observations, patient history, and epidemiological information. The expected result is Negative.  Fact Sheet for Patients:  EntrepreneurPulse.com.au  Fact Sheet for Healthcare Providers:  IncredibleEmployment.be  This test is no t yet approved or cleared by the [place] FDA and  has been authorized for detection and/or diagnosis of SARS-CoV-2 by FDA under an Emergency Use Authorization (EUA). This EUA will remain  in effect (meaning this test can be used) for the duration of the COVID-19 declaration under Section 564(b)(1) of the Act, 21 U.S.C.section 360bbb-3(b)(1), unless the authorization is terminated  or revoked sooner.       Influenza A by PCR NEGATIVE NEGATIVE Final   Influenza B by PCR NEGATIVE NEGATIVE Final    Comment: (NOTE) The Xpert Xpress SARS-CoV-2/FLU/RSV plus assay is intended as an aid in the diagnosis of influenza from Nasopharyngeal swab specimens and should not be used as a sole basis for treatment. Nasal washings and aspirates are unacceptable for Xpert Xpress SARS-CoV-2/FLU/RSV testing.  Fact Sheet for Patients: EntrepreneurPulse.com.au  Fact Sheet for Healthcare  Providers: IncredibleEmployment.be  This test is not yet approved or cleared by the [place] FDA and has  been authorized for detection and/or diagnosis of SARS-CoV-2 by FDA under an Emergency Use Authorization (EUA). This EUA will remain in effect (meaning this test can be used) for the duration of the COVID-19 declaration under Section 564(b)(1) of the Act, 21 U.S.C. section 360bbb-3(b)(1), unless the authorization is terminated or revoked.  Performed at [facility], [address] [place], [place] [address]   Blood Culture (routine x 2)     Status: None   Collection Time: 06/02/22  3:18 PM   Specimen: BLOOD RIGHT HAND  Result Value Ref Range Status   Specimen Description   Final    BLOOD RIGHT HAND Performed at [facility], [address], [place], [place] [address]    Special Requests   Final    BOTTLES DRAWN AEROBIC AND ANAEROBIC Blood Culture adequate volume Performed at [facility], [address], [place], [place] [address]    Culture   Final    NO GROWTH 5 DAYS Performed at [facility], [place] [address] [place], [place] [address]    Report Status 06/07/2022 FINAL  Final  Blood Culture (routine x 2)     Status: None   Collection Time: 06/02/22  3:56 PM   Specimen: BLOOD  Result Value Ref Range Status   Specimen Description   Final    BLOOD BLOOD LEFT ARM Performed at [facility], [address], [place], [place] [address]    Special Requests   Final    Blood Culture adequate volume BOTTLES DRAWN AEROBIC AND ANAEROBIC Performed at [facility], [address], [place], [place] [address]    Culture   Final    NO GROWTH 5 DAYS Performed at [facility], [place] [address] [place], [place] [address]    Report Status 06/07/2022 FINAL  Final  Urine Culture     Status: None   Collection Time: 06/02/22  6:32 PM   Specimen: In/Out  Cath Urine  Result Value Ref Range Status   Specimen Description   Final    IN/OUT CATH URINE Performed at [facility], [address], [place], [place] [address]    Special Requests   Final    NONE Performed at [facility], [address] [place], [place] [address]    Culture   Final    NO GROWTH Performed at [facility], [place] [address] [place],  [address]    Report Status 06/04/2022 FINAL  Final     Time coordinating discharge: Over 30 minutes  SIGNED:   Phillips Climes, MD  [facility] 06/08/2022, 2:39 PM Pager   If 7PM-7AM, please contact night-coverage [email or web address]

## 2022-06-17 ENCOUNTER — Other Ambulatory Visit (INDEPENDENT_AMBULATORY_CARE_PROVIDER_SITE_OTHER): Payer: 59

## 2022-06-17 DIAGNOSIS — E1165 Type 2 diabetes mellitus with hyperglycemia: Secondary | ICD-10-CM | POA: Diagnosis not present

## 2022-06-17 DIAGNOSIS — Z794 Long term (current) use of insulin: Secondary | ICD-10-CM

## 2022-06-17 LAB — GLUCOSE, RANDOM: Glucose, Bld: 158 mg/dL — ABNORMAL HIGH (ref 70–99)

## 2022-06-17 LAB — HEMOGLOBIN A1C: Hgb A1c MFr Bld: 7.7 % — ABNORMAL HIGH (ref 4.6–6.5)

## 2022-06-21 NOTE — Progress Notes (Unsigned)
Patient ID: Jose [name], male   DOB: 02/14/1976, 46 y.o.   MRN: [id number]           Reason for Appointment: Type II Diabetes follow-up   History of Present Illness   Diagnosis date: age 78  Previous history:  He was initially put on medications at the time of diagnosis but only in his early 13s  Non-insulin hypoglycemic drugs previously used: Trulicity, Byetta, Amaryl, metformin, Tradjenta, Farxiga and Invokana Insulin was started in 2022 He has previously been taking Levemir and Lantus insulin as well as NovoLog  A1c range in the last few years is:7-14.2  Recent history:     Non-insulin hypoglycemic drugs: None     Insulin regimen: 20 units of 70/30 insulin since 2/23    Side effects from medications: Trulicity caused nausea and tremor  Current self management, blood sugar patterns and problems identified:  A1c is last 7.7 Fructosamine is last 387 His insulin was supposed to be changed to Toujeo and NovoLog but he did not start this for unknown reasons Previously was having reportedly high readings after meals but currently not monitoring after meals Also was supposed to start [facility] with a sample and he did not do this as he was going out of town Subsequently was admitted for sepsis He has been using the [employer] and did not start using the freestyle libre as directed He was supposed to pick up the [place] 3 sensors but he has not done so and not clear why the pharmacy did not give it to him  He has done mostly fasting blood sugars which are looking recently higher  Right after his hospital discharge he had a few low normal sugars around dinnertime but not recently He appears to have gained weight but may be also having swelling in his legs He is scheduled to see the diabetes educator today  Exercise: Less recently Diet management: Bleeding 3 meals     Hypoglycemia: As above  Glucometer: Ongoing  Blood Glucose readings from meter review:  PRE-MEAL  Fasting Lunch Dinner Bedtime Overall  Glucose range: 104-174 121     Mean/median:   116     POST-MEAL PC Breakfast PC Lunch PC Dinner  Glucose range:  ? ?  Mean/median:           Weight control:  Wt Readings from Last 3 Encounters:  06/08/22 233 lb 11 oz (106 kg)  04/29/22 211 lb 3.2 oz (95.8 kg)  02/18/22 186 lb 3.2 oz (84.5 kg)            Diabetes labs:  Lab Results  Component Value Date   HGBA1C 7.7 (H) 06/17/2022   HGBA1C 7.8 (H) 06/05/2022   HGBA1C 11.4 (A) 02/18/2022   Lab Results  Component Value Date   LDLCALC 57 11/22/2019   CREATININE 3.97 (H) 06/08/2022     Allergies as of 06/22/2022       Reactions   Cranberry Itching   Hm Lidocaine Patch [lidocaine] Dermatitis   Blisters skin    Melatonin Other (See Comments)   nightmares   Nsaids Other (See Comments)   Stage 3 kidney disease        Medication List        Accurate as of June 22, 2022  1:18 PM. If you have any questions, ask your nurse or doctor.          Acetaminophen 500 MG capsule Take 500-1,000 mg by mouth every 6 (six) hours as needed  for mild pain.   albuterol 108 (90 Base) MCG/ACT inhaler Commonly known as: VENTOLIN HFA Inhale 1 puff into the lungs every 4 (four) hours as needed for wheezing or shortness of breath.   amLODipine 10 MG tablet Commonly known as: NORVASC Take 1 tablet (10 mg total) by mouth daily.   atorvastatin 40 MG tablet Commonly known as: LIPITOR Take 40 mg by mouth daily.   bethanechol 10 MG tablet Commonly known as: URECHOLINE Take 1 tablet (10 mg total) by mouth 3 (three) times daily.   cholecalciferol 25 MCG (1000 UNIT) tablet Commonly known as: VITAMIN D3 Take 1,000 Units by mouth daily.   FreeStyle Libre 3 Sensor Misc 1 Device by Does not apply route every 14 (fourteen) days. Apply 1 sensor on upper arm every 14 days for continuous glucose monitoring   gabapentin 100 MG capsule Commonly known as: NEURONTIN Take 2 capsules (200 mg total)  by mouth 2 (two) times daily. What changed: how much to take   hydrALAZINE 50 MG tablet Commonly known as: APRESOLINE Take 1 tablet (50 mg total) by mouth every 8 (eight) hours.   hydrOXYzine 25 MG tablet Commonly known as: ATARAX Take 1 tablet (25 mg total) by mouth 3 (three) times daily as needed for anxiety or nausea.   insulin NPH-regular Human (70-30) 100 UNIT/ML injection Inject 20 Units into the skin daily with breakfast.   Metoprolol Tartrate 75 MG Tabs Take 75 mg by mouth 2 (two) times daily.   mirtazapine 15 MG tablet Commonly known as: REMERON Take 1 tablet (15 mg total) by mouth at bedtime.   Narcan 4 MG/0.1ML Liqd nasal spray kit Generic drug: naloxone Use as needed for signs of overdose.   ondansetron 4 MG disintegrating tablet Commonly known as: ZOFRAN-ODT Take 4 mg by mouth daily.   oxyCODONE 5 MG immediate release tablet Commonly known as: Oxy IR/ROXICODONE Take 1 tablet (5 mg total) by mouth every 6 (six) hours as needed for severe pain or breakthrough pain.   PARoxetine 20 MG tablet Commonly known as: PAXIL Take 1 tablet (20 mg total) by mouth at bedtime.   Pentips 32G X 4 MM Misc Generic drug: Insulin Pen Needle use as directed   polyethylene glycol 17 g packet Commonly known as: MIRALAX / GLYCOLAX Take 17 g by mouth daily as needed for moderate constipation, mild constipation or severe constipation.   tamsulosin 0.4 MG Caps capsule Commonly known as: FLOMAX Take 1 capsule (0.4 mg total) by mouth daily after supper.   tirzepatide 5 MG/0.5ML Pen Commonly known as: MOUNJARO Inject 5 mg into the skin once a week.   tobramycin 0.3 % ophthalmic solution Commonly known as: TOBREX Place 1 drop into both eyes as directed. Retina injections        Allergies:  Allergies  Allergen Reactions   Cranberry Itching   Hm Lidocaine Patch [Lidocaine] Dermatitis    Blisters skin    Melatonin Other (See Comments)    nightmares   Nsaids Other (See  Comments)    Stage 3 kidney disease    Past Medical History:  Diagnosis Date   AKI (acute kidney injury) ([facility])    GAD (generalized anxiety disorder)    Hyperlipidemia    Macular degeneration, bilateral    Retinopathy    Type II diabetes mellitus with complication, uncontrolled    retinopathy, neuropathy, microalbuminuria    Past Surgical History:  Procedure Laterality Date   APPENDECTOMY     BIOPSY  08/10/2021   Procedure:  BIOPSY;  Surgeon: Otis Brace, MD;  Location: [facility];  Service: Gastroenterology;;   BUBBLE STUDY  07/29/2021   Procedure: BUBBLE STUDY;  Surgeon: Jerline Pain, MD;  Location: [facility];  Service: Cardiovascular;;   ESOPHAGOGASTRODUODENOSCOPY (EGD) WITH PROPOFOL N/A 08/10/2021   Procedure: ESOPHAGOGASTRODUODENOSCOPY (EGD) WITH PROPOFOL;  Surgeon: Otis Brace, MD;  Location: [facility];  Service: Gastroenterology;  Laterality: N/A;   ESOPHAGOGASTRODUODENOSCOPY (EGD) WITH PROPOFOL N/A 08/24/2021   Procedure: ESOPHAGOGASTRODUODENOSCOPY (EGD) WITH PROPOFOL;  Surgeon: Otis Brace, MD;  Location: [facility];  Service: Gastroenterology;  Laterality: N/A;   HERNIA REPAIR     IR FLUORO GUIDED NEEDLE PLC ASPIRATION/INJECTION LOC  07/28/2021   LUMBAR LAMINECTOMY/DECOMPRESSION MICRODISCECTOMY N/A 08/07/2021   Procedure: THORACIC FOUR - THORACIC FIVE LAMINECTOMY/DECOMPRESSION OF SPINAL CORD, DEBRIDEMENT OF ABSCESS, MICRODISCECTOMY, INTRAOPERATIVE ULTRASOUND;  Surgeon: Consuella Lose, MD;  Location: [facility];  Service: Neurosurgery;  Laterality: N/A;   TEE WITHOUT CARDIOVERSION N/A 07/29/2021   Procedure: TRANSESOPHAGEAL ECHOCARDIOGRAM (TEE);  Surgeon: Jerline Pain, MD;  Location: [facility];  Service: Cardiovascular;  Laterality: N/A;   TRIGGER FINGER RELEASE Right 10/25/2019   Procedure: RIGHT INDEX FINGER RELEASE TRIGGER FINGER/A-1 PULLEY;  Surgeon: Daryll Brod, MD;  Location: [facility];  Service: Orthopedics;  Laterality:  Right;  IV REGIONAL FOREARM BLOCK    Family History  Problem Relation Age of Onset   Diabetes Mother    Hyperlipidemia Mother    Stroke Mother    Diabetes Father    Hyperlipidemia Brother    Stroke Brother    ADD / ADHD Brother    ADD / ADHD Son     Social History:  reports that he quit smoking about 22 months ago. His smoking use included cigarettes. He has a 20.00 pack-year smoking history. He has never used smokeless tobacco. He reports that he does not currently use alcohol. He reports that he does not use drugs.  Review of Systems:  Last diabetic eye exam date 1/21  Last foot exam date: 8/22  Hypertension:   Treatment does not include ACE inhibitor or ARB, followed by nephrologist  BP Readings from Last 3 Encounters:  06/22/22 (!) 148/68  06/08/22 (!) 147/82  04/29/22 (!) 170/86   Lab Results  Component Value Date   CREATININE 3.97 (H) 06/08/2022   CREATININE 3.77 (H) 06/07/2022   CREATININE 3.86 (H) 06/06/2022     Lipid management: Managed with atorvastatin 40 mg daily    Lab Results  Component Value Date   CHOL 116 11/22/2019   CHOL 110 07/20/2019   CHOL 101 12/12/2018   Lab Results  Component Value Date   HDL 42 11/22/2019   HDL 38 (L) 07/20/2019   HDL 47 12/12/2018   Lab Results  Component Value Date   LDLCALC 57 11/22/2019   LDLCALC 46 07/20/2019   LDLCALC 40 12/12/2018   Lab Results  Component Value Date   TRIG 174 (H) 08/17/2021   TRIG 181 (H) 08/14/2021   TRIG 140 05/18/2021   Lab Results  Component Value Date   CHOLHDL 2.8 11/22/2019   CHOLHDL 2.9 07/20/2019   CHOLHDL 2.1 12/12/2018   Lab Results  Component Value Date   LDLDIRECT 115.0 04/23/2022     Examination:   BP (!) 148/68   Pulse 74   SpO2 98%   There is no height or weight on file to calculate BMI.    ASSESSMENT/ PLAN:    Diabetes type 2 on insulin:   Current regimen: 70/30 insulin once  a day  See history of present illness for detailed discussion of  current diabetes management, blood sugar patterns and problems identified  A1c is last 7.7  Unclear if his blood sugars are improving because of worsening renal failure Previously was having high readings after meals but this is difficult to assess He is however trending to have higher fasting readings but likely much better readings in the afternoons and evenings including low sugars about 2 weeks ago in the evening  Blood glucose control is suboptimal because of using only 1 shot of 70/30 insulin daily Considering that he has had marked hyperglycemia in the last few months he needs to be on a separate basal and bolus insulin regimen and likely a GLP-1 drug that he can tolerate Also gaining weight now for various reasons  Recommendations:  Start checking blood sugars consistently at different times and especially 2 hours after meals If he is able to get coverage for the freestyle libre version 3 he will start using this, asked him to check with the pharmacy again.  Also may consider Dexcom sensor or prior authorization He will need to split his insulin to twice a day for more even control and try 12 units in the morning and 8 in the evening at dinnertime If his blood sugars are inconsistently controlled especially with CGM analysis will switch to basal bolus as previously discussed Start Mounjaro 2.5 mg weekly as discussed and again discussed benefits of this and how to use it and titrate the dose  Recent edema: He will need to call his nephrologist to get a diuretic  There are no Patient Instructions on file for this visit.  Total visit including counseling = 30 minutes  Elayne Snare 06/22/2022, 1:18 PM

## 2022-06-22 ENCOUNTER — Encounter: Payer: Self-pay | Admitting: Endocrinology

## 2022-06-22 ENCOUNTER — Encounter: Payer: 59 | Admitting: Nutrition

## 2022-06-22 ENCOUNTER — Ambulatory Visit (INDEPENDENT_AMBULATORY_CARE_PROVIDER_SITE_OTHER): Payer: 59 | Admitting: Endocrinology

## 2022-06-22 VITALS — BP 148/68 | HR 74 | Ht 71.0 in | Wt 232.8 lb

## 2022-06-22 DIAGNOSIS — E1165 Type 2 diabetes mellitus with hyperglycemia: Secondary | ICD-10-CM | POA: Diagnosis not present

## 2022-06-22 DIAGNOSIS — I10 Essential (primary) hypertension: Secondary | ICD-10-CM

## 2022-06-22 DIAGNOSIS — Z794 Long term (current) use of insulin: Secondary | ICD-10-CM

## 2022-06-22 NOTE — Patient Instructions (Addendum)
Take 12 units in am and 8 before dinner  Check blood sugars on waking up 5 days a week  Also check blood sugars about 2 hours after meals and do this after different meals by rotation  Recommended blood sugar levels on waking up are 90-130 and about 2 hours after meal is 130-180  Please bring your blood sugar monitor to each visit, thank you  Check with [facility] about coverage for the freestyle libre 2 or 3 version or Dexcom  Start [place] weekly with the 2.5 mg sample Make sure you keep portion small when starting this and read the brochure given  After 4 doses pick up the prescription for the 5 mg dose

## 2022-06-27 ENCOUNTER — Encounter (HOSPITAL_COMMUNITY): Payer: Self-pay | Admitting: Emergency Medicine

## 2022-06-27 ENCOUNTER — Inpatient Hospital Stay (HOSPITAL_COMMUNITY)
Admission: EM | Admit: 2022-06-27 | Discharge: 2022-07-05 | DRG: 392 | Disposition: A | Discharge status: Home / Self Care | Payer: 59 | Attending: Internal Medicine | Admitting: Internal Medicine

## 2022-06-27 ENCOUNTER — Emergency Department (HOSPITAL_COMMUNITY): Payer: 59

## 2022-06-27 ENCOUNTER — Other Ambulatory Visit: Payer: Self-pay

## 2022-06-27 DIAGNOSIS — N179 Acute kidney failure, unspecified: Secondary | ICD-10-CM | POA: Diagnosis present

## 2022-06-27 DIAGNOSIS — D638 Anemia in other chronic diseases classified elsewhere: Secondary | ICD-10-CM | POA: Diagnosis present

## 2022-06-27 DIAGNOSIS — Z794 Long term (current) use of insulin: Secondary | ICD-10-CM

## 2022-06-27 DIAGNOSIS — E785 Hyperlipidemia, unspecified: Secondary | ICD-10-CM | POA: Diagnosis present

## 2022-06-27 DIAGNOSIS — B3781 Candidal esophagitis: Secondary | ICD-10-CM | POA: Diagnosis not present

## 2022-06-27 DIAGNOSIS — E872 Acidosis, unspecified: Secondary | ICD-10-CM | POA: Diagnosis present

## 2022-06-27 DIAGNOSIS — I129 Hypertensive chronic kidney disease with stage 1 through stage 4 chronic kidney disease, or unspecified chronic kidney disease: Secondary | ICD-10-CM | POA: Diagnosis present

## 2022-06-27 DIAGNOSIS — E8809 Other disorders of plasma-protein metabolism, not elsewhere classified: Secondary | ICD-10-CM | POA: Diagnosis not present

## 2022-06-27 DIAGNOSIS — E8770 Fluid overload, unspecified: Secondary | ICD-10-CM | POA: Diagnosis not present

## 2022-06-27 DIAGNOSIS — Z7985 Long-term (current) use of injectable non-insulin antidiabetic drugs: Secondary | ICD-10-CM

## 2022-06-27 DIAGNOSIS — Z818 Family history of other mental and behavioral disorders: Secondary | ICD-10-CM

## 2022-06-27 DIAGNOSIS — N184 Chronic kidney disease, stage 4 (severe): Secondary | ICD-10-CM | POA: Diagnosis not present

## 2022-06-27 DIAGNOSIS — Z79899 Other long term (current) drug therapy: Secondary | ICD-10-CM

## 2022-06-27 DIAGNOSIS — D631 Anemia in chronic kidney disease: Secondary | ICD-10-CM | POA: Diagnosis present

## 2022-06-27 DIAGNOSIS — Z886 Allergy status to analgesic agent status: Secondary | ICD-10-CM

## 2022-06-27 DIAGNOSIS — Z823 Family history of stroke: Secondary | ICD-10-CM

## 2022-06-27 DIAGNOSIS — Z20822 Contact with and (suspected) exposure to covid-19: Secondary | ICD-10-CM | POA: Diagnosis present

## 2022-06-27 DIAGNOSIS — Z91018 Allergy to other foods: Secondary | ICD-10-CM

## 2022-06-27 DIAGNOSIS — E11319 Type 2 diabetes mellitus with unspecified diabetic retinopathy without macular edema: Secondary | ICD-10-CM | POA: Diagnosis present

## 2022-06-27 DIAGNOSIS — Z833 Family history of diabetes mellitus: Secondary | ICD-10-CM

## 2022-06-27 DIAGNOSIS — N189 Chronic kidney disease, unspecified: Secondary | ICD-10-CM

## 2022-06-27 DIAGNOSIS — E611 Iron deficiency: Secondary | ICD-10-CM | POA: Diagnosis present

## 2022-06-27 DIAGNOSIS — Z87891 Personal history of nicotine dependence: Secondary | ICD-10-CM

## 2022-06-27 DIAGNOSIS — E1122 Type 2 diabetes mellitus with diabetic chronic kidney disease: Secondary | ICD-10-CM | POA: Diagnosis present

## 2022-06-27 DIAGNOSIS — R109 Unspecified abdominal pain: Secondary | ICD-10-CM

## 2022-06-27 DIAGNOSIS — E1169 Type 2 diabetes mellitus with other specified complication: Secondary | ICD-10-CM | POA: Diagnosis present

## 2022-06-27 DIAGNOSIS — A084 Viral intestinal infection, unspecified: Secondary | ICD-10-CM | POA: Diagnosis not present

## 2022-06-27 DIAGNOSIS — E669 Obesity, unspecified: Secondary | ICD-10-CM | POA: Diagnosis present

## 2022-06-27 DIAGNOSIS — R197 Diarrhea, unspecified: Secondary | ICD-10-CM

## 2022-06-27 DIAGNOSIS — M4624 Osteomyelitis of vertebra, thoracic region: Secondary | ICD-10-CM | POA: Diagnosis present

## 2022-06-27 DIAGNOSIS — R112 Nausea with vomiting, unspecified: Principal | ICD-10-CM | POA: Diagnosis present

## 2022-06-27 DIAGNOSIS — I10 Essential (primary) hypertension: Secondary | ICD-10-CM | POA: Diagnosis present

## 2022-06-27 DIAGNOSIS — Z83438 Family history of other disorder of lipoprotein metabolism and other lipidemia: Secondary | ICD-10-CM

## 2022-06-27 DIAGNOSIS — M4644 Discitis, unspecified, thoracic region: Secondary | ICD-10-CM | POA: Diagnosis present

## 2022-06-27 DIAGNOSIS — I160 Hypertensive urgency: Secondary | ICD-10-CM | POA: Diagnosis present

## 2022-06-27 DIAGNOSIS — F411 Generalized anxiety disorder: Secondary | ICD-10-CM | POA: Diagnosis present

## 2022-06-27 DIAGNOSIS — M4620 Osteomyelitis of vertebra, site unspecified: Secondary | ICD-10-CM | POA: Diagnosis present

## 2022-06-27 DIAGNOSIS — Z6832 Body mass index (BMI) 32.0-32.9, adult: Secondary | ICD-10-CM

## 2022-06-27 DIAGNOSIS — Z888 Allergy status to other drugs, medicaments and biological substances status: Secondary | ICD-10-CM

## 2022-06-27 DIAGNOSIS — G4730 Sleep apnea, unspecified: Secondary | ICD-10-CM

## 2022-06-27 DIAGNOSIS — D649 Anemia, unspecified: Secondary | ICD-10-CM | POA: Diagnosis present

## 2022-06-27 LAB — COMPREHENSIVE METABOLIC PANEL
ALT: 28 U/L (ref 0–44)
AST: 17 U/L (ref 15–41)
Albumin: 3.4 g/dL — ABNORMAL LOW (ref 3.5–5.0)
Alkaline Phosphatase: 167 U/L — ABNORMAL HIGH (ref 38–126)
Anion gap: 14 (ref 5–15)
BUN: 47 mg/dL — ABNORMAL HIGH (ref 6–20)
CO2: 17 mmol/L — ABNORMAL LOW (ref 22–32)
Calcium: 9 mg/dL (ref 8.9–10.3)
Chloride: 111 mmol/L (ref 98–111)
Creatinine, Ser: 4.01 mg/dL — ABNORMAL HIGH (ref 0.61–1.24)
GFR, Estimated: 18 mL/min — ABNORMAL LOW (ref >60)
Glucose, Bld: 136 mg/dL — ABNORMAL HIGH (ref 70–99)
Potassium: 4.7 mmol/L (ref 3.5–5.1)
Sodium: 142 mmol/L (ref 135–145)
Total Bilirubin: 0.9 mg/dL (ref 0.3–1.2)
Total Protein: 6.4 g/dL — ABNORMAL LOW (ref 6.5–8.1)

## 2022-06-27 LAB — CBC WITH DIFFERENTIAL/PLATELET
Abs Immature Granulocytes: 0.03 10*3/uL (ref 0.00–0.07)
Basophils Absolute: 0.1 10*3/uL (ref 0.0–0.1)
Basophils Relative: 1 %
Eosinophils Absolute: 0.1 10*3/uL (ref 0.0–0.5)
Eosinophils Relative: 1 %
HCT: 26.7 % — ABNORMAL LOW (ref 39.0–52.0)
Hemoglobin: 8.8 g/dL — ABNORMAL LOW (ref 13.0–17.0)
Immature Granulocytes: 0 %
Lymphocytes Relative: 11 %
Lymphs Abs: 1 10*3/uL (ref 0.7–4.0)
MCH: 29.9 pg (ref 26.0–34.0)
MCHC: 33 g/dL (ref 30.0–36.0)
MCV: 90.8 fL (ref 80.0–100.0)
Monocytes Absolute: 0.6 10*3/uL (ref 0.1–1.0)
Monocytes Relative: 6 %
Neutro Abs: 7.9 10*3/uL — ABNORMAL HIGH (ref 1.7–7.7)
Neutrophils Relative %: 81 %
Platelets: 222 10*3/uL (ref 150–400)
RBC: 2.94 MIL/uL — ABNORMAL LOW (ref 4.22–5.81)
RDW: 11.9 % (ref 11.5–15.5)
WBC: 9.6 10*3/uL (ref 4.0–10.5)
nRBC: 0 % (ref 0.0–0.2)

## 2022-06-27 LAB — URINALYSIS, ROUTINE W REFLEX MICROSCOPIC
Bacteria, UA: NONE SEEN
Bilirubin Urine: NEGATIVE
Glucose, UA: 50 mg/dL — AB
Ketones, ur: 20 mg/dL — AB
Leukocytes,Ua: NEGATIVE
Nitrite: NEGATIVE
Protein, ur: 100 mg/dL — AB
Specific Gravity, Urine: 1.01 (ref 1.005–1.030)
pH: 5 (ref 5.0–8.0)

## 2022-06-27 LAB — I-STAT VENOUS BLOOD GAS, ED
Acid-base deficit: 6 mmol/L — ABNORMAL HIGH (ref 0.0–2.0)
Bicarbonate: 18.4 mmol/L — ABNORMAL LOW (ref 20.0–28.0)
Calcium, Ion: 1.16 mmol/L (ref 1.15–1.40)
HCT: 24 % — ABNORMAL LOW (ref 39.0–52.0)
Hemoglobin: 8.2 g/dL — ABNORMAL LOW (ref 13.0–17.0)
O2 Saturation: 91 %
Potassium: 4.7 mmol/L (ref 3.5–5.1)
Sodium: 142 mmol/L (ref 135–145)
TCO2: 19 mmol/L — ABNORMAL LOW (ref 22–32)
pCO2, Ven: 32 mmHg — ABNORMAL LOW (ref 44–60)
pH, Ven: 7.366 (ref 7.25–7.43)
pO2, Ven: 63 mmHg — ABNORMAL HIGH (ref 32–45)

## 2022-06-27 LAB — PROTIME-INR
INR: 1.1 (ref 0.8–1.2)
Prothrombin Time: 14.2 seconds (ref 11.4–15.2)

## 2022-06-27 LAB — GLUCOSE, CAPILLARY: Glucose-Capillary: 133 mg/dL — ABNORMAL HIGH (ref 70–99)

## 2022-06-27 LAB — CBG MONITORING, ED
Glucose-Capillary: 134 mg/dL — ABNORMAL HIGH (ref 70–99)
Glucose-Capillary: 157 mg/dL — ABNORMAL HIGH (ref 70–99)

## 2022-06-27 LAB — LIPASE, BLOOD: Lipase: 32 U/L (ref 11–51)

## 2022-06-27 LAB — BETA-HYDROXYBUTYRIC ACID: Beta-Hydroxybutyric Acid: 3.44 mmol/L — ABNORMAL HIGH (ref 0.05–0.27)

## 2022-06-27 LAB — LACTIC ACID, PLASMA
Lactic Acid, Venous: 0.7 mmol/L (ref 0.5–1.9)
Lactic Acid, Venous: 0.9 mmol/L (ref 0.5–1.9)

## 2022-06-27 LAB — SARS CORONAVIRUS 2 BY RT PCR: SARS Coronavirus 2 by RT PCR: NEGATIVE

## 2022-06-27 LAB — APTT: aPTT: 31 seconds (ref 24–36)

## 2022-06-27 MED ORDER — PAROXETINE HCL 20 MG PO TABS
20.0000 mg | ORAL_TABLET | Freq: Every day | ORAL | Status: DC
  Administered 2022-06-27 – 2022-07-04 (×8): 20 mg via ORAL
  Filled 2022-06-27 (×9): qty 1

## 2022-06-27 MED ORDER — LACTATED RINGERS IV BOLUS (SEPSIS)
500.0000 mL | Freq: Once | INTRAVENOUS | Status: AC
  Administered 2022-06-27: 500 mL via INTRAVENOUS

## 2022-06-27 MED ORDER — ACETAMINOPHEN 650 MG RE SUPP: 650.0000 mg | Freq: Four times a day (QID) | RECTAL | Status: DC | PRN

## 2022-06-27 MED ORDER — SODIUM CHLORIDE 0.9 % IV SOLN: INTRAVENOUS | Status: AC

## 2022-06-27 MED ORDER — METOPROLOL TARTRATE 25 MG PO TABS
75.0000 mg | ORAL_TABLET | Freq: Two times a day (BID) | ORAL | Status: DC
  Administered 2022-06-28 – 2022-07-05 (×15): 75 mg via ORAL
  Filled 2022-06-27 (×16): qty 3

## 2022-06-27 MED ORDER — INSULIN GLARGINE-YFGN 100 UNIT/ML ~~LOC~~ SOLN
8.0000 [IU] | Freq: Every day | SUBCUTANEOUS | Status: DC
  Administered 2022-06-28 – 2022-07-04 (×8): 8 [IU] via SUBCUTANEOUS
  Filled 2022-06-27 (×10): qty 0.08

## 2022-06-27 MED ORDER — OXYCODONE HCL 5 MG PO TABS
5.0000 mg | ORAL_TABLET | ORAL | Status: DC | PRN
  Administered 2022-06-27 – 2022-07-05 (×10): 5 mg via ORAL
  Filled 2022-06-27 (×12): qty 1

## 2022-06-27 MED ORDER — ALBUTEROL SULFATE (2.5 MG/3ML) 0.083% IN NEBU
2.5000 mg | INHALATION_SOLUTION | RESPIRATORY_TRACT | Status: DC | PRN
Start: 2022-06-27 — End: 2022-07-05

## 2022-06-27 MED ORDER — GABAPENTIN 100 MG PO CAPS
200.0000 mg | ORAL_CAPSULE | Freq: Two times a day (BID) | ORAL | Status: DC
  Administered 2022-06-28 – 2022-07-05 (×16): 200 mg via ORAL
  Filled 2022-06-27 (×16): qty 2

## 2022-06-27 MED ORDER — ATORVASTATIN CALCIUM 40 MG PO TABS
40.0000 mg | ORAL_TABLET | Freq: Every day | ORAL | Status: DC
  Administered 2022-06-27 – 2022-07-05 (×9): 40 mg via ORAL
  Filled 2022-06-27 (×9): qty 1

## 2022-06-27 MED ORDER — MORPHINE SULFATE (PF) 4 MG/ML IV SOLN
4.0000 mg | Freq: Once | INTRAVENOUS | Status: AC
  Administered 2022-06-27: 4 mg via INTRAVENOUS
  Filled 2022-06-27: qty 1

## 2022-06-27 MED ORDER — HYDROXYZINE HCL 25 MG PO TABS
25.0000 mg | ORAL_TABLET | Freq: Three times a day (TID) | ORAL | Status: DC | PRN
Start: 2022-06-27 — End: 2022-07-05
  Administered 2022-06-27 – 2022-06-30 (×3): 25 mg via ORAL
  Filled 2022-06-27 (×3): qty 1

## 2022-06-27 MED ORDER — INSULIN ASPART 100 UNIT/ML IJ SOLN
0.0000 [IU] | Freq: Three times a day (TID) | INTRAMUSCULAR | Status: DC
  Administered 2022-06-27: 2 [IU] via SUBCUTANEOUS
  Administered 2022-06-28: 1 [IU] via SUBCUTANEOUS
  Administered 2022-06-28 – 2022-06-30 (×4): 2 [IU] via SUBCUTANEOUS
  Administered 2022-06-30 – 2022-07-01 (×2): 1 [IU] via SUBCUTANEOUS
  Administered 2022-07-02 – 2022-07-03 (×2): 2 [IU] via SUBCUTANEOUS
  Administered 2022-07-03: 1 [IU] via SUBCUTANEOUS
  Administered 2022-07-03: 2 [IU] via SUBCUTANEOUS
  Administered 2022-07-04: 1 [IU] via SUBCUTANEOUS
  Administered 2022-07-04: 2 [IU] via SUBCUTANEOUS
  Administered 2022-07-04: 1 [IU] via SUBCUTANEOUS
  Administered 2022-07-05: 2 [IU] via SUBCUTANEOUS

## 2022-06-27 MED ORDER — MIRTAZAPINE 15 MG PO TABS
15.0000 mg | ORAL_TABLET | Freq: Every day | ORAL | Status: DC
  Administered 2022-06-27 – 2022-07-04 (×8): 15 mg via ORAL
  Filled 2022-06-27 (×8): qty 1

## 2022-06-27 MED ORDER — HYDROMORPHONE HCL 1 MG/ML IJ SOLN
0.5000 mg | INTRAMUSCULAR | Status: DC | PRN
  Administered 2022-06-28 – 2022-07-05 (×29): 0.5 mg via INTRAVENOUS
  Filled 2022-06-27 (×29): qty 0.5

## 2022-06-27 MED ORDER — AMLODIPINE BESYLATE 10 MG PO TABS
10.0000 mg | ORAL_TABLET | Freq: Every day | ORAL | Status: DC
  Administered 2022-06-27 – 2022-07-05 (×9): 10 mg via ORAL
  Filled 2022-06-27: qty 1
  Filled 2022-06-27: qty 2
  Filled 2022-06-27 (×7): qty 1

## 2022-06-27 MED ORDER — ONDANSETRON HCL 4 MG/2ML IJ SOLN
4.0000 mg | Freq: Four times a day (QID) | INTRAMUSCULAR | Status: DC | PRN
  Administered 2022-06-27 – 2022-07-03 (×13): 4 mg via INTRAVENOUS
  Filled 2022-06-27 (×14): qty 2

## 2022-06-27 MED ORDER — ONDANSETRON HCL 4 MG PO TABS
4.0000 mg | ORAL_TABLET | Freq: Four times a day (QID) | ORAL | Status: DC | PRN
  Administered 2022-06-30: 4 mg via ORAL
  Filled 2022-06-27: qty 1

## 2022-06-27 MED ORDER — TAMSULOSIN HCL 0.4 MG PO CAPS
0.4000 mg | ORAL_CAPSULE | Freq: Every day | ORAL | Status: DC
  Administered 2022-06-27 – 2022-07-04 (×8): 0.4 mg via ORAL
  Filled 2022-06-27 (×8): qty 1

## 2022-06-27 MED ORDER — HEPARIN SODIUM (PORCINE) 5000 UNIT/ML IJ SOLN
5000.0000 [IU] | Freq: Three times a day (TID) | INTRAMUSCULAR | Status: DC
Start: 2022-06-27 — End: 2022-07-05
  Administered 2022-06-27 – 2022-07-05 (×23): 5000 [IU] via SUBCUTANEOUS
  Filled 2022-06-27 (×23): qty 1

## 2022-06-27 MED ORDER — ONDANSETRON HCL 4 MG/2ML IJ SOLN
4.0000 mg | Freq: Once | INTRAMUSCULAR | Status: AC
  Administered 2022-06-27: 4 mg via INTRAVENOUS
  Filled 2022-06-27: qty 2

## 2022-06-27 MED ORDER — ACETAMINOPHEN 325 MG PO TABS: 650.0000 mg | ORAL_TABLET | Freq: Four times a day (QID) | ORAL | Status: DC | PRN

## 2022-06-27 MED ORDER — LORAZEPAM 2 MG/ML IJ SOLN
0.5000 mg | Freq: Once | INTRAMUSCULAR | Status: AC
Start: 2022-06-27 — End: 2022-06-27
  Administered 2022-06-27: 0.5 mg via INTRAVENOUS
  Filled 2022-06-27: qty 1

## 2022-06-27 NOTE — ED Notes (Signed)
Patient incontinent of stool. Patient was assisted with getting cleaned up. Patient given new brief. Patient adjusted back in the bed. Patient resting at this time.

## 2022-06-27 NOTE — Plan of Care (Signed)

## 2022-06-27 NOTE — ED Provider Notes (Signed)
[facility] EMERGENCY DEPARTMENT Provider Note   CSN: [id number] Arrival date & time: 06/27/22  4888     History {Add pertinent medical, surgical, social history, OB history to HPI:1} Chief Complaint  Patient presents with   Emesis    PETROS AHART is a 46 y.o. male.   Emesis    Patient has history of multiple medical problems including diabetes, hyperlipidemia, anxiety, AKI.  He also has had trouble with osteomyelitis.  Patient recently in the hospital last month.  He was admitted with concerns of sepsis and osteomyelitis but ultimately patient felt to have chronic osteomyelitis and no acute infection.  There was no concern for recurrent osteomyelitis or sepsis ultimately after his evaluation.  Patient did have hypertensive urgency and also acute on chronic kidney injury.  Patient states yesterday he has started having symptoms that felt like when he was in the hospital.  He has been having some nausea and vomiting.  He felt feverish although his max temperature measured was 99.1.  He is having some abdominal discomfort.  No rashes.  No leg swelling.  Some cough.  No chest pain  Home Medications Prior to Admission medications   Medication Sig Start Date End Date Taking? Authorizing Provider  Acetaminophen 500 MG capsule Take 500-1,000 mg by mouth every 6 (six) hours as needed for mild pain.    [provider]  albuterol (VENTOLIN HFA) 108 (90 Base) MCG/ACT inhaler Inhale 1 puff into the lungs every 4 (four) hours as needed for wheezing or shortness of breath. 05/27/21   [provider]  amLODipine (NORVASC) 10 MG tablet Take 1 tablet (10 mg total) by mouth daily. 06/09/22   Elgergawy, Silver Huguenin, MD  atorvastatin (LIPITOR) 40 MG tablet Take 40 mg by mouth daily. Patient not taking: Reported on 06/03/2022    [provider]  bethanechol (URECHOLINE) 10 MG tablet Take 1 tablet (10 mg total) by mouth 3 (three) times daily. 09/21/21   Love, Ivan Anchors, PA-C  cholecalciferol (VITAMIN D3) 25 MCG (1000 UNIT) tablet Take 1,000 Units by mouth daily.    [provider]  Continuous Blood Gluc Sensor (FREESTYLE LIBRE 3 SENSOR) MISC 1 Device by Does not apply route every 14 (fourteen) days. Apply 1 sensor on upper arm every 14 days for continuous glucose monitoring 04/29/22   Elayne Snare, MD  gabapentin (NEURONTIN) 100 MG capsule Take 2 capsules (200 mg total) by mouth 2 (two) times daily. Patient taking differently: Take 300 mg by mouth 2 (two) times daily. 09/21/21   Love, Ivan Anchors, PA-C  hydrALAZINE (APRESOLINE) 50 MG tablet Take 1 tablet (50 mg total) by mouth every 8 (eight) hours. 06/08/22   Elgergawy, Silver Huguenin, MD  hydrOXYzine (ATARAX) 25 MG tablet Take 1 tablet (25 mg total) by mouth 3 (three) times daily as needed for anxiety or nausea. 01/13/22   Lovorn, Jinny Blossom, MD  insulin NPH-regular Human (70-30) 100 UNIT/ML injection Inject 20 Units into the skin daily with breakfast.    [provider]  Insulin Pen Needle (PENTIPS) 32G X 4 MM MISC use as directed 09/21/21   Love, Ivan Anchors, PA-C  Metoprolol Tartrate 75 MG TABS Take 75 mg by mouth 2 (two) times daily. 06/08/22   Elgergawy, Silver Huguenin, MD  mirtazapine (REMERON) 15 MG tablet Take 1 tablet (15 mg total) by mouth at bedtime. 09/21/21   Love, Ivan Anchors, PA-C  ondansetron (ZOFRAN-ODT) 4 MG disintegrating tablet Take 4 mg by mouth daily. 12/28/21  [provider]  oxyCODONE (OXY IR/ROXICODONE) 5 MG immediate release tablet Take 1 tablet (5 mg total) by mouth every 6 (six) hours as needed for severe pain or breakthrough pain. 06/08/22   Elgergawy, Silver Huguenin, MD  PARoxetine (PAXIL) 20 MG tablet Take 1 tablet (20 mg total) by mouth at bedtime. 09/21/21   Love, Ivan Anchors, PA-C  polyethylene glycol (MIRALAX / GLYCOLAX) 17 g packet Take 17 g by mouth daily as needed for moderate constipation, mild constipation or severe constipation. 08/29/21   Terrilee Croak, MD  tamsulosin (FLOMAX) 0.4 MG  CAPS capsule Take 1 capsule (0.4 mg total) by mouth daily after supper. 09/21/21   Love, Ivan Anchors, PA-C  tirzepatide [facility]) 5 MG/0.5ML Pen Inject 5 mg into the skin once a week. Patient not taking: Reported on 06/22/2022 04/30/22   Elayne Snare, MD  tobramycin (TOBREX) 0.3 % ophthalmic solution Place 1 drop into both eyes as directed. Retina injections 11/03/21   [provider]      Allergies    Cranberry, Hm lidocaine patch [lidocaine], Melatonin, and Nsaids    Review of Systems   Review of Systems  Gastrointestinal:  Positive for vomiting.    Physical Exam Updated Vital Signs BP 131/68   Pulse 95   Temp 98.1 F (36.7 C)   Resp 16   Ht 1.803 m (5\' 11" )   Wt 105.2 kg   SpO2 94%   BMI 32.36 kg/m  Physical Exam Vitals and nursing note reviewed.  Constitutional:      Appearance: He is well-developed. He is not toxic-appearing or diaphoretic.  HENT:     Head: Normocephalic and atraumatic.     Right Ear: External ear normal.     Left Ear: External ear normal.  Eyes:     General: No scleral icterus.       Right eye: No discharge.        Left eye: No discharge.     Conjunctiva/sclera: Conjunctivae normal.  Neck:     Trachea: No tracheal deviation.  Cardiovascular:     Rate and Rhythm: Normal rate and regular rhythm.  Pulmonary:     Effort: Pulmonary effort is normal. No respiratory distress.     Breath sounds: Normal breath sounds. No stridor. No wheezing or rales.  Abdominal:     General: Bowel sounds are normal. There is no distension.     Palpations: Abdomen is soft.     Tenderness: There is abdominal tenderness. There is no guarding or rebound.     Comments: Mild tenderness diffusely  Musculoskeletal:        General: No tenderness or deformity.     Cervical back: Neck supple.     Comments: No erythema or spinal tenderness  Skin:    General: Skin is warm and dry.     Findings: No rash.  Neurological:     General: No focal deficit present.     Mental  Status: He is alert.     Cranial Nerves: No cranial nerve deficit (no facial droop, extraocular movements intact, no slurred speech).     Sensory: No sensory deficit.     Motor: No abnormal muscle tone or seizure activity.     Coordination: Coordination normal.  Psychiatric:        Mood and Affect: Mood normal.     ED Results / Procedures / Treatments   Labs (all labs ordered are listed, but only abnormal results are displayed) Labs Reviewed  COMPREHENSIVE METABOLIC PANEL -  Abnormal; Notable for the following components:      Result Value   CO2 17 (*)    Glucose, Bld 136 (*)    BUN 47 (*)    Creatinine, Ser 4.01 (*)    Total Protein 6.4 (*)    Albumin 3.4 (*)    Alkaline Phosphatase 167 (*)    GFR, Estimated 18 (*)    All other components within normal limits  CBC WITH DIFFERENTIAL/PLATELET - Abnormal; Notable for the following components:   RBC 2.94 (*)    Hemoglobin 8.8 (*)    HCT 26.7 (*)    Neutro Abs 7.9 (*)    All other components within normal limits  URINALYSIS, ROUTINE W REFLEX MICROSCOPIC - Abnormal; Notable for the following components:   Color, Urine STRAW (*)    Glucose, UA 50 (*)    Hgb urine dipstick SMALL (*)    Ketones, ur 20 (*)    Protein, ur 100 (*)    All other components within normal limits  BETA-HYDROXYBUTYRIC ACID - Abnormal; Notable for the following components:   Beta-Hydroxybutyric Acid 3.44 (*)    All other components within normal limits  I-STAT VENOUS BLOOD GAS, ED - Abnormal; Notable for the following components:   pCO2, Ven 32.0 (*)    pO2, Ven 63 (*)    Bicarbonate 18.4 (*)    TCO2 19 (*)    Acid-base deficit 6.0 (*)    HCT 24.0 (*)    Hemoglobin 8.2 (*)    All other components within normal limits  CBG MONITORING, ED - Abnormal; Notable for the following components:   Glucose-Capillary 134 (*)    All other components within normal limits  CULTURE, BLOOD (ROUTINE X 2)  CULTURE, BLOOD (ROUTINE X 2)  SARS CORONAVIRUS 2 BY RT PCR   LACTIC ACID, PLASMA  PROTIME-INR  APTT  LIPASE, BLOOD  LACTIC ACID, PLASMA    EKG EKG Interpretation  Date/Time:  Sunday June 27 2022 08:37:47 EDT Ventricular Rate:  91 PR Interval:  190 QRS Duration: 96 QT Interval:  374 QTC Calculation: 461 R Axis:   70 Text Interpretation: Sinus rhythm Left atrial enlargement Borderline T abnormalities, inferior leads Since last tracing rate faster Confirmed by Dorie Rank [id number]) on 06/27/2022 9:08:07 AM  Radiology CT ABDOMEN PELVIS WO CONTRAST  Result Date: 06/27/2022 CLINICAL DATA:  Abdominal pain, nausea, vomiting EXAM: CT ABDOMEN AND PELVIS WITHOUT CONTRAST TECHNIQUE: Multidetector CT imaging of the abdomen and pelvis was performed following the standard protocol without IV contrast. RADIATION DOSE REDUCTION: This exam was performed according to the departmental dose-optimization program which includes automated exposure control, adjustment of the mA and/or kV according to patient size and/or use of iterative reconstruction technique. COMPARISON:  CT done on 08/06/2021, renal sonogram done on 06/05/2022 FINDINGS: Lower chest: Heart is enlarged in size. Coronary artery calcifications are seen. Small bilateral pleural effusions are seen. Small patchy infiltrates are seen in both lower lung fields. Hepatobiliary: No focal abnormalities are seen in liver. Gallbladder stones are seen. There is no wall thickening in gallbladder. Pancreas: No focal abnormalities are seen. Spleen: Unremarkable. Adrenals/Urinary Tract: Adrenals are unremarkable. There is no hydronephrosis. There are no renal or ureteral stones. Urinary bladder is not distended. Stomach/Bowel: Small hiatal hernia is seen. There is moderate distention of stomach. Small bowel loops are unremarkable. Appendix is not seen. There is no significant wall thickening in colon. There is fluid in the lumen of the colon and rectum. Vascular/Lymphatic: Scattered arterial calcifications are  seen.  Reproductive: Prostate is unremarkable. Calcifications are seen in seminal vesicles and vas deferens. Other: There is no ascites or pneumoperitoneum. Umbilical and paraumbilical hernias containing fat are noted. Bilateral inguinal hernias containing fat are seen, larger on the left side. There is mild edema in subcutaneous plane in the abdominal wall, possibly suggesting anasarca. Musculoskeletal: Unremarkable. IMPRESSION: There is no evidence of intestinal obstruction or pneumoperitoneum. There is no hydronephrosis. There is liquid stool in the lumen of colon and rectum. This may be a normal variation or suggest nonspecific enterocolitis. There is no significant wall thickening or pericolic stranding. Gallbladder stones. Small hiatal hernia. Small bilateral pleural effusions. There are small patchy infiltrates in both lower lung fields suggesting atelectasis/pneumonia. Cardiomegaly. Coronary artery calcifications are seen. Other findings as described in the body of the report. Electronically Signed   By: Elmer Picker M.D.   On: 06/27/2022 13:36   DG Chest Port 1 View  Result Date: 06/27/2022 CLINICAL DATA:  Sepsis. EXAM: PORTABLE CHEST 1 VIEW COMPARISON:  June 02, 2022. FINDINGS: Mild cardiomegaly is noted. Lungs are clear. Bony thorax is unremarkable. IMPRESSION: No active disease. Electronically Signed   By: Marijo Conception M.D.   On: 06/27/2022 09:36    Procedures Procedures  {Document cardiac monitor, telemetry assessment procedure when appropriate:1}  Medications Ordered in ED Medications  morphine (PF) 4 MG/ML injection 4 mg (has no administration in time range)  lactated ringers bolus 500 mL (0 mLs Intravenous Stopped 06/27/22 1100)  lactated ringers bolus 500 mL (0 mLs Intravenous Stopped 06/27/22 1337)  ondansetron (ZOFRAN) injection 4 mg (4 mg Intravenous Given 06/27/22 1304)    ED Course/ Medical Decision Making/ A&P Clinical Course as of 06/27/22 1346  Sun Jun 27, 2022  1029 I-Stat  venous blood gas, [facility] ED only)(!) No acidosis noted on VBG [JK]  1029 Beta-hydroxybutyric acid(!) Beta hydroxybutyric acid elevated [JK]  1029 Comprehensive metabolic panel(!) Bicarb decreased, BUN and creatinine elevated [JK]  1030 CBC with Differential(!) Hemoglobin stable compared to previous [JK]  1031 Chest x-ray without pneumonia [JK]  1045 Bicarb is decreased but similar compared to previous values.  Suspect this is more related to use chronic kidney disease rather than DKA [JK]  1253 Patient still having vomiting.  Not tolerating p.o.  With his complaints of abdominal pain will CT to rule out any signs of obstruction [JK]  1344 Chest x-ray and CT abdomen pelvis findings reviewed.  Small patchy infiltrates in the lung bases suggesting atelectasis or pneumonia.  Otherwise no significant findings on CT.  Liquid stool noted in the lumen of the colon and rectum [JK]    Clinical Course User Index [JK] Dorie Rank, MD                           Medical Decision Making Problems Addressed: Abdominal pain, unspecified abdominal location: acute illness or injury Chronic kidney disease (CKD), stage IV (severe) ([place]): chronic illness or injury with exacerbation, progression, or side effects of treatment Nausea and vomiting, unspecified vomiting type: acute illness or injury  Amount and/or Complexity of Data Reviewed Labs: ordered. Decision-making details documented in ED Course. Radiology: ordered.  Risk Prescription drug management.   Patient presented to the ED for evaluation of nausea vomiting fevers chills.  Patient at risk for multiple complications considering his history of prior sepsis chronic kidney disease and diabetes.  Patient noted to have persistent chronic kidney disease.  He does have a metabolic acidosis.  Venous pH without significant acidosis but he does have decreased bicarb.  Laboratory values are similar to previous.  No definitive signs of DKA, less likely with his only  mildly elevated blood sugar but we will continue to monitor.  CT scan was performed and there is no signs of obstruction or other acute abnormalities.  Patient however has had persistent nausea and vomiting.  I have ordered IV fluids and antiemetics.  I think he would benefit from admission to the hospital for continued IV hydration and close monitoring  {Document critical care time when appropriate:1} {Document review of labs and clinical decision tools ie heart score, Chads2Vasc2 etc:1}  {Document your independent review of radiology images, and any outside records:1} {Document your discussion with family members, caretakers, and with consultants:1} {Document social determinants of health affecting pt's care:1} {Document your decision making why or why not admission, treatments were needed:1} Final Clinical Impression(s) / ED Diagnoses Final diagnoses:  Nausea and vomiting, unspecified vomiting type  Abdominal pain, unspecified abdominal location  Chronic kidney disease (CKD), stage IV (severe) ([place])    Rx / DC Orders ED Discharge Orders     None

## 2022-06-27 NOTE — Assessment & Plan Note (Addendum)
No anion gap, ketones in urine.  Recent A1C in 8/23 was 7.7 On 70/30, substitute with semglee decreased dose of 8 units with decreased PO intake  Has not started his mounjaro SSI and accuchecks QAC/HS

## 2022-06-27 NOTE — Assessment & Plan Note (Signed)
At baseline, continue to monitor °

## 2022-06-27 NOTE — Assessment & Plan Note (Addendum)
46 year old with 2 day history intractable N/V/D in setting of progressing CKD -obs to telemetry -anti-emetics,slowly advance diet -check stool studies/quick c.diff. no hx of sick contacts. Had brief IV abx during last hospitalization a few weeks ago -strict I/O -gentle IVF  -hold bethanechol

## 2022-06-27 NOTE — Assessment & Plan Note (Signed)
Continue paxil, prn hydroxyzine

## 2022-06-27 NOTE — Assessment & Plan Note (Addendum)
Normotensive-mildly elevated, but improved from HTN urgency on prior admit and he has not taken any of his medication  Continue home medication of norvasc 10mg  daily, hydralazine 50mg  TID and metoprolol 75mg  BID  Titrate as needed

## 2022-06-27 NOTE — Assessment & Plan Note (Addendum)
No change in back pain, no leukocytosis, normal lactic acid MRI during 8/9 admit with no evidence of recurrent OM Continue pain control  Off antibiotics since April

## 2022-06-27 NOTE — Assessment & Plan Note (Signed)
baselin creatinine was around 2-2.5; however, during recent hospitalization creatinine remained around 3.9. nephrology was consulted and felt this would be closer to his new baseline. He is at 4.0 today, so may  Not have true AKI -avoid nephrotoxic drugs -renal ultrasound 8/12 significant for suggested medical renal disease with mild increased cortical echogenicity of the kidneys -strict I/O -gentle IVF  -UA with ketones and protein, similar to last UA in setting of intractable N/V  -trend

## 2022-06-27 NOTE — ED Triage Notes (Signed)
Patient brought in by wife in wheelchair c/o nausea and vomiting onset of yesterday with subjective fever. Patient states he was here recently with same symptoms. Started on turosemide on Thursday. C/o back pain and soreness from vomiting.

## 2022-06-27 NOTE — H&P (Signed)
History and Physical    Patient: Jose [name] OAC:[id number] DOB: January 31, 1976 DOA: 06/27/2022 DOS: the patient was seen and examined on 06/27/2022 PCP: Madelon Lips, MD  Patient coming from: Home - lives with his wife and kids. He uses a cane to ambulate.    Chief Complaint: intractable N/V   HPI: Jose [name] is a 46 y.o. male with medical history significant of   of hypertension, hyperlipidemia, diabetes mellitus type 2, CKD stage IV, and history of thoracic osteomyelitis/discitis who presented to ED with complaints on intractable N/V that started yesterday. He also noticed that he seemed to be retaining fluid since he got discharged from the hospital. He denies any sick contacts. He some mild abdominal pain that he thinks is from heaving. He also has pain in his back from shaking. His back pain has not changed from previous visit. No fevers at home, recorded a 99.5 this AM, but he was in bed with covers. He has been nauseated since he left the hospital. He threw up nearly 12-13 times. He has zofran at home which has helped, but he hasn't been able to eat anything.   Recently admitted for N/V and aki on ckd 8/9-8/15. Presented with SIRS criteria nad concern for recurrent osteomyellitis; however, imaging showed no acute changes and ID/neurosurgery consulted. BC remained negative. No concerns for recurrent OM. He also had HTN urgency and blood pressure medication adjusted.    Denies any fever, +chills, vision changes, +headaches, chest pain or palpitations, shortness of breath or cough, dysuria and +leg swelling.    Saw his kidney doctor on Thursday and he was put on a diuretic.   He does not smoke or drink.   ER Course:  vitals: afebrile, bp: 164/77, HR 92, RR: 20, oxygen: 97%RA Pertinent labs: hgb: 8.8, co2:17, BUN: 47, creatinine: 4.01,  CXR: no acute finding CT abdo/pelvis: liquid in stool, normal variant vs. Nonspecific enterocolitis.  In ED: given 500cc IVF, TRH asked to  admit.     Review of Systems: As mentioned in the history of present illness. All other systems reviewed and are negative. Past Medical History:  Diagnosis Date   AKI (acute kidney injury) ([place])    GAD (generalized anxiety disorder)    Hyperlipidemia    Macular degeneration, bilateral    Retinopathy    Type II diabetes mellitus with complication, uncontrolled    retinopathy, neuropathy, microalbuminuria   Past Surgical History:  Procedure Laterality Date   APPENDECTOMY     BIOPSY  08/10/2021   Procedure: BIOPSY;  Surgeon: Otis Brace, MD;  Location: [place];  Service: Gastroenterology;;   BUBBLE STUDY  07/29/2021   Procedure: BUBBLE STUDY;  Surgeon: Jerline Pain, MD;  Location: [facility];  Service: Cardiovascular;;   ESOPHAGOGASTRODUODENOSCOPY (EGD) WITH PROPOFOL N/A 08/10/2021   Procedure: ESOPHAGOGASTRODUODENOSCOPY (EGD) WITH PROPOFOL;  Surgeon: Otis Brace, MD;  Location: [facility];  Service: Gastroenterology;  Laterality: N/A;   ESOPHAGOGASTRODUODENOSCOPY (EGD) WITH PROPOFOL N/A 08/24/2021   Procedure: ESOPHAGOGASTRODUODENOSCOPY (EGD) WITH PROPOFOL;  Surgeon: Otis Brace, MD;  Location: [place];  Service: Gastroenterology;  Laterality: N/A;   HERNIA REPAIR     IR FLUORO GUIDED NEEDLE PLC ASPIRATION/INJECTION LOC  07/28/2021   LUMBAR LAMINECTOMY/DECOMPRESSION MICRODISCECTOMY N/A 08/07/2021   Procedure: THORACIC FOUR - THORACIC FIVE LAMINECTOMY/DECOMPRESSION OF SPINAL CORD, DEBRIDEMENT OF ABSCESS, MICRODISCECTOMY, INTRAOPERATIVE ULTRASOUND;  Surgeon: Consuella Lose, MD;  Location: [facility];  Service: Neurosurgery;  Laterality: N/A;   TEE WITHOUT CARDIOVERSION N/A 07/29/2021   Procedure: TRANSESOPHAGEAL ECHOCARDIOGRAM (  TEE);  Surgeon: Jerline Pain, MD;  Location[facility];  Service: Cardiovascular;  Laterality: N/A;   TRIGGER FINGER RELEASE Right 10/25/2019   Procedure: RIGHT INDEX FINGER RELEASE TRIGGER FINGER/A-1 PULLEY;  Surgeon: Daryll Brod, MD;  Location: [place];  Service: Orthopedics;  Laterality: Right;  IV REGIONAL FOREARM BLOCK   Social History:  reports that he quit smoking about 23 months ago. His smoking use included cigarettes. He has a 20.00 pack-year smoking history. He has never used smokeless tobacco. He reports that he does not currently use alcohol. He reports that he does not use drugs.  Allergies  Allergen Reactions   Cranberry Itching   Hm Lidocaine Patch [Lidocaine] Dermatitis    Blisters skin    Melatonin Other (See Comments)    nightmares   Nsaids Other (See Comments)    Stage 3 kidney disease   Trulicity [Dulaglutide] Other (See Comments)    Night sweats  Uncontrolled tempeture     Family History  Problem Relation Age of Onset   Diabetes Mother    Hyperlipidemia Mother    Stroke Mother    Diabetes Father    Hyperlipidemia Brother    Stroke Brother    ADD / ADHD Brother    ADD / ADHD Son     Prior to Admission medications   Medication Sig Start Date End Date Taking? Authorizing Provider  Acetaminophen 500 MG capsule Take 500-1,000 mg by mouth every 6 (six) hours as needed for mild pain.    [provider]  albuterol (VENTOLIN HFA) 108 (90 Base) MCG/ACT inhaler Inhale 1 puff into the lungs every 4 (four) hours as needed for wheezing or shortness of breath. 05/27/21   [provider]  amLODipine (NORVASC) 10 MG tablet Take 1 tablet (10 mg total) by mouth daily. 06/09/22   Elgergawy, Silver Huguenin, MD  atorvastatin (LIPITOR) 40 MG tablet Take 40 mg by mouth daily. Patient not taking: Reported on 06/03/2022    [provider]  bethanechol (URECHOLINE) 10 MG tablet Take 1 tablet (10 mg total) by mouth 3 (three) times daily. 09/21/21   Love, Ivan Anchors, PA-C  cholecalciferol (VITAMIN D3) 25 MCG (1000 UNIT) tablet Take 1,000 Units by mouth daily.    [provider]  Continuous Blood Gluc Sensor (FREESTYLE LIBRE 3 SENSOR) MISC 1 Device by Does not apply  route every 14 (fourteen) days. Apply 1 sensor on upper arm every 14 days for continuous glucose monitoring 04/29/22   Elayne Snare, MD  gabapentin (NEURONTIN) 100 MG capsule Take 2 capsules (200 mg total) by mouth 2 (two) times daily. Patient taking differently: Take 300 mg by mouth 2 (two) times daily. 09/21/21   Love, Ivan Anchors, PA-C  hydrALAZINE (APRESOLINE) 50 MG tablet Take 1 tablet (50 mg total) by mouth every 8 (eight) hours. 06/08/22   Elgergawy, Silver Huguenin, MD  hydrOXYzine (ATARAX) 25 MG tablet Take 1 tablet (25 mg total) by mouth 3 (three) times daily as needed for anxiety or nausea. 01/13/22   Lovorn, Jinny Blossom, MD  insulin NPH-regular Human (70-30) 100 UNIT/ML injection Inject 20 Units into the skin daily with breakfast.    [provider]  Insulin Pen Needle (PENTIPS) 32G X 4 MM MISC use as directed 09/21/21   Love, Ivan Anchors, PA-C  Metoprolol Tartrate 75 MG TABS Take 75 mg by mouth 2 (two) times daily. 06/08/22   Elgergawy, Silver Huguenin, MD  mirtazapine (REMERON) 15 MG tablet Take 1 tablet (15 mg total)  by mouth at bedtime. 09/21/21   Love, Ivan Anchors, PA-C  ondansetron (ZOFRAN-ODT) 4 MG disintegrating tablet Take 4 mg by mouth daily. 12/28/21   [provider]  oxyCODONE (OXY IR/ROXICODONE) 5 MG immediate release tablet Take 1 tablet (5 mg total) by mouth every 6 (six) hours as needed for severe pain or breakthrough pain. 06/08/22   Elgergawy, Silver Huguenin, MD  PARoxetine (PAXIL) 20 MG tablet Take 1 tablet (20 mg total) by mouth at bedtime. 09/21/21   Love, Ivan Anchors, PA-C  polyethylene glycol (MIRALAX / GLYCOLAX) 17 g packet Take 17 g by mouth daily as needed for moderate constipation, mild constipation or severe constipation. 08/29/21   Terrilee Croak, MD  tamsulosin (FLOMAX) 0.4 MG CAPS capsule Take 1 capsule (0.4 mg total) by mouth daily after supper. 09/21/21   Love, Ivan Anchors, PA-C  tirzepatide [facility]) 5 MG/0.5ML Pen Inject 5 mg into the skin once a week. Patient not taking: Reported on  06/22/2022 04/30/22   Elayne Snare, MD  tobramycin (TOBREX) 0.3 % ophthalmic solution Place 1 drop into both eyes as directed. Retina injections 11/03/21   [provider]    Physical Exam: Vitals:   06/27/22 1145 06/27/22 1230 06/27/22 1245 06/27/22 1400  BP: (!) 152/83 131/68 (!) 142/78 (!) 156/84  Pulse: 95 95 94 93  Resp: (!) 27 16 18 12   Temp:  98.1 F (36.7 C)    TempSrc:      SpO2: 94% 94% 93% 92%  Weight:      Height:       General:  Appears calm and comfortable and is in NAD anxious and shaking Eyes:  PERRL, EOMI, normal lids, iris ENT:  grossly normal hearing, lips & tongue, mmm; appropriate dentition Neck:  no LAD, masses or thyromegaly; no carotid bruits Cardiovascular:  RRR, no m/r/g. +2-3+pitting edema BLE Respiratory:   CTA bilaterally with no wheezes/rales/rhonchi.  Normal respiratory effort. Abdomen:  soft, NT, ND, NABS Back:   normal alignment, no CVAT Skin:  no rash or induration seen on limited exam Musculoskeletal:  grossly normal tone BUE/BLE, good ROM, no bony abnormality Lower extremity:  Limited foot exam with no ulcerations.  2+ distal pulses. Psychiatric:  grossly normal mood and affect, speech fluent and appropriate, AOx3 Neurologic:  CN 2-12 grossly intact, moves all extremities in coordinated fashion, sensation intact   Radiological Exams on Admission: Independently reviewed - see discussion in A/P where applicable  CT ABDOMEN PELVIS WO CONTRAST  Result Date: 06/27/2022 CLINICAL DATA:  Abdominal pain, nausea, vomiting EXAM: CT ABDOMEN AND PELVIS WITHOUT CONTRAST TECHNIQUE: Multidetector CT imaging of the abdomen and pelvis was performed following the standard protocol without IV contrast. RADIATION DOSE REDUCTION: This exam was performed according to the departmental dose-optimization program which includes automated exposure control, adjustment of the mA and/or kV according to patient size and/or use of iterative reconstruction technique.  COMPARISON:  CT done on 08/06/2021, renal sonogram done on 06/05/2022 FINDINGS: Lower chest: Heart is enlarged in size. Coronary artery calcifications are seen. Small bilateral pleural effusions are seen. Small patchy infiltrates are seen in both lower lung fields. Hepatobiliary: No focal abnormalities are seen in liver. Gallbladder stones are seen. There is no wall thickening in gallbladder. Pancreas: No focal abnormalities are seen. Spleen: Unremarkable. Adrenals/Urinary Tract: Adrenals are unremarkable. There is no hydronephrosis. There are no renal or ureteral stones. Urinary bladder is not distended. Stomach/Bowel: Small hiatal hernia is seen. There is moderate distention of stomach. Small bowel loops are unremarkable.  Appendix is not seen. There is no significant wall thickening in colon. There is fluid in the lumen of the colon and rectum. Vascular/Lymphatic: Scattered arterial calcifications are seen. Reproductive: Prostate is unremarkable. Calcifications are seen in seminal vesicles and vas deferens. Other: There is no ascites or pneumoperitoneum. Umbilical and paraumbilical hernias containing fat are noted. Bilateral inguinal hernias containing fat are seen, larger on the left side. There is mild edema in subcutaneous plane in the abdominal wall, possibly suggesting anasarca. Musculoskeletal: Unremarkable. IMPRESSION: There is no evidence of intestinal obstruction or pneumoperitoneum. There is no hydronephrosis. There is liquid stool in the lumen of colon and rectum. This may be a normal variation or suggest nonspecific enterocolitis. There is no significant wall thickening or pericolic stranding. Gallbladder stones. Small hiatal hernia. Small bilateral pleural effusions. There are small patchy infiltrates in both lower lung fields suggesting atelectasis/pneumonia. Cardiomegaly. Coronary artery calcifications are seen. Other findings as described in the body of the report. Electronically Signed   By:  Elmer Picker M.D.   On: 06/27/2022 13:36   DG Chest Port 1 View  Result Date: 06/27/2022 CLINICAL DATA:  Sepsis. EXAM: PORTABLE CHEST 1 VIEW COMPARISON:  June 02, 2022. FINDINGS: Mild cardiomegaly is noted. Lungs are clear. Bony thorax is unremarkable. IMPRESSION: No active disease. Electronically Signed   By: Marijo Conception M.D.   On: 06/27/2022 09:36    EKG: Independently reviewed.  NSR with rate 91; nonspecific ST changes with no evidence of acute ischemia   Labs on Admission: I have personally reviewed the available labs and imaging studies at the time of the admission.  Pertinent labs:   hgb: 8.8,  co2:17,  BUN: 47,  creatinine: 4.01,   Assessment and Plan: Principal Problem:   Intractable nausea and vomiting Active Problems:   Acute kidney injury superimposed on chronic kidney disease (HCC)   Vertebral osteomyelitis (HCC)   Type 2 diabetes mellitus with chronic kidney disease, with long-term current use of insulin (HCC)   Anemia   GAD (generalized anxiety disorder)   Hyperlipidemia   Essential hypertension    Assessment and Plan: * Intractable nausea and vomiting 46 year old with 2 day history intractable N/V/D in setting of progressing CKD -obs to telemetry -anti-emetics,slowly advance diet -check stool studies/quick c.diff. no hx of sick contacts. Had brief IV abx during last hospitalization a few weeks ago -strict I/O -gentle IVF   Acute kidney injury superimposed on chronic kidney disease ([place]) baselin creatinine was around 2-2.5; however, during recent hospitalization creatinine remained around 3.9. nephrology was consulted and felt this would be closer to his new baseline. He is at 4.0 today, so may  Not have true AKI -avoid nephrotoxic drugs -renal ultrasound 8/12 significant for suggested medical renal disease with mild increased cortical echogenicity of the kidneys -strict I/O -gentle IVF  -UA with ketones and protein, similar to last UA in setting  of intractable N/V  -trend   Type 2 diabetes mellitus with chronic kidney disease, with long-term current use of insulin (HCC) No anion gap, ketones in urine.  Recent A1C in 8/23 was 7.7 On 70/30, substitute with semglee decreased dose of 8 units with decreased PO intake  Has not started his mounjaro SSI and accuchecks QAC/HS   Vertebral osteomyelitis (HCC) No change in back pain, no leukocytosis, normal lactic acid Continue pain control  Off antibiotics since April  Anemia At baseline, continue to monitor   GAD (generalized anxiety disorder) Continue paxil, prn hydroxyzine   Essential  hypertension Normotensive-mildly elevated, but improved from HTN urgency on prior admit and he has not taken any of his medication  Continue home medication of norvasc 10mg  daily, hydralazine 50mg  TID and metoprolol 75mg  BID  Titrate as needed   Hyperlipidemia Continue lipitor     Advance Care Planning:   Code Status: Full Code   Consults: none   DVT Prophylaxis: heparin   Family Communication: updated wife by phone Courtney Paris: [phone]  Severity of Illness: The appropriate patient status for this patient is OBSERVATION. Observation status is judged to be reasonable and necessary in order to provide the required intensity of service to ensure the patient's safety. The patient's presenting symptoms, physical exam findings, and initial radiographic and laboratory data in the context of their medical condition is felt to place them at decreased risk for further clinical deterioration. Furthermore, it is anticipated that the patient will be medically stable for discharge from the hospital within 2 midnights of admission.   Author: Orma Flaming, MD 06/27/2022 3:21 PM  For on call review [email or web address].

## 2022-06-27 NOTE — Assessment & Plan Note (Signed)
Continue lipitor  ?

## 2022-06-28 DIAGNOSIS — N179 Acute kidney failure, unspecified: Secondary | ICD-10-CM | POA: Diagnosis not present

## 2022-06-28 DIAGNOSIS — M4620 Osteomyelitis of vertebra, site unspecified: Secondary | ICD-10-CM

## 2022-06-28 DIAGNOSIS — I10 Essential (primary) hypertension: Secondary | ICD-10-CM | POA: Diagnosis not present

## 2022-06-28 DIAGNOSIS — D649 Anemia, unspecified: Secondary | ICD-10-CM | POA: Diagnosis not present

## 2022-06-28 DIAGNOSIS — E1122 Type 2 diabetes mellitus with diabetic chronic kidney disease: Secondary | ICD-10-CM

## 2022-06-28 DIAGNOSIS — N1832 Chronic kidney disease, stage 3b: Secondary | ICD-10-CM

## 2022-06-28 DIAGNOSIS — N189 Chronic kidney disease, unspecified: Secondary | ICD-10-CM

## 2022-06-28 DIAGNOSIS — R112 Nausea with vomiting, unspecified: Secondary | ICD-10-CM | POA: Diagnosis not present

## 2022-06-28 DIAGNOSIS — Z794 Long term (current) use of insulin: Secondary | ICD-10-CM

## 2022-06-28 DIAGNOSIS — F411 Generalized anxiety disorder: Secondary | ICD-10-CM

## 2022-06-28 DIAGNOSIS — E785 Hyperlipidemia, unspecified: Secondary | ICD-10-CM

## 2022-06-28 LAB — CBC
HCT: 21.5 % — ABNORMAL LOW (ref 39.0–52.0)
Hemoglobin: 7.1 g/dL — ABNORMAL LOW (ref 13.0–17.0)
MCH: 30 pg (ref 26.0–34.0)
MCHC: 33 g/dL (ref 30.0–36.0)
MCV: 90.7 fL (ref 80.0–100.0)
Platelets: 205 10*3/uL (ref 150–400)
RBC: 2.37 MIL/uL — ABNORMAL LOW (ref 4.22–5.81)
RDW: 12.3 % (ref 11.5–15.5)
WBC: 8.1 10*3/uL (ref 4.0–10.5)
nRBC: 0 % (ref 0.0–0.2)

## 2022-06-28 LAB — BASIC METABOLIC PANEL
Anion gap: 9 (ref 5–15)
BUN: 47 mg/dL — ABNORMAL HIGH (ref 6–20)
CO2: 18 mmol/L — ABNORMAL LOW (ref 22–32)
Calcium: 8 mg/dL — ABNORMAL LOW (ref 8.9–10.3)
Chloride: 114 mmol/L — ABNORMAL HIGH (ref 98–111)
Creatinine, Ser: 3.89 mg/dL — ABNORMAL HIGH (ref 0.61–1.24)
GFR, Estimated: 18 mL/min — ABNORMAL LOW (ref >60)
Glucose, Bld: 131 mg/dL — ABNORMAL HIGH (ref 70–99)
Potassium: 4.6 mmol/L (ref 3.5–5.1)
Sodium: 141 mmol/L (ref 135–145)

## 2022-06-28 LAB — GLUCOSE, CAPILLARY
Glucose-Capillary: 109 mg/dL — ABNORMAL HIGH (ref 70–99)
Glucose-Capillary: 122 mg/dL — ABNORMAL HIGH (ref 70–99)
Glucose-Capillary: 128 mg/dL — ABNORMAL HIGH (ref 70–99)
Glucose-Capillary: 156 mg/dL — ABNORMAL HIGH (ref 70–99)
Glucose-Capillary: 156 mg/dL — ABNORMAL HIGH (ref 70–99)

## 2022-06-28 MED ORDER — SODIUM CHLORIDE 0.9 % IV SOLN: INTRAVENOUS | Status: AC

## 2022-06-28 NOTE — Progress Notes (Signed)
Ted hose applied.

## 2022-06-28 NOTE — Progress Notes (Signed)
PROGRESS NOTE    Jose [name]  GDJ:[id number] DOB: 24-Jan-1976 DOA: 06/27/2022 PCP: Madelon Lips, MD   Brief Narrative:  The patient is a obese Caucasian male with a past medical history significant for but not limited to hypertension, hyperlipidemia, diabetes mellitus type 2, history of chronic kidney disease stage IV, history of thoracic osteomyelitis/discitis as well as other comorbidities who presented to the ED with complaints of intractable nausea and vomiting with associated loose bowel movements and diarrhea as well.  He states that he has noticed that he may continue to retain some fluid since he got discharged in the hospital last.  Denies any sick contacts and had some mild abdominal pain but is exercising.  Also has pain and back pain and shaking and has spasms in his back pain is not changed from his prior visit.  He denies fevers at home but had more core temperature of 99.5 yesterday He was in bed with covers.  He has been nauseous.  A lot last time and has not really thrown up to 4-13 times and has used Zofran at home which has helped but he has not been able to eat anything.  Is resting admitted for nausea vomiting AKI on CKD stage IV from 8/9 to 06/08/2022.  He was presented with SIRS criteria and concern for recurrent osteomyelitis infection.  Imaging at that time showed no acute changes and ID neurosurgery consulted and blood cultures remain negative.  There were no concerns for recurrent osteomyelitis at that time and he does have hypertensive urgency and blood pressure medications were adjusted.  Currently he denies any fevers but has had chills and headaches as well as leg swelling.  He was seen by his nephrologist on Thursday and placed on a diuretic of torsemide.  In the ED he was given 500 mill bolus as well as placed on maintenance IV fluids at 75 mils per hour for 10 hours.  We will remove this continue to monitor his renal function carefully given that he continues have  intractable nausea vomiting.  CT scan of the abdomen pelvis was done and showed "There is no evidence of intestinal obstruction or pneumoperitoneum. There is no hydronephrosis.   There is liquid stool in the lumen of colon and rectum. This may be a normal variation or suggest  nonspecific enterocolitis. There is no significant wall thickening or pericolic stranding. Gallbladder stones. Small hiatal hernia. Small bilateral pleural effusions. There are small patchy infiltrates in both lower lung fields suggesting atelectasis/pneumonia.  cardiomegaly. Coronary artery calcifications are seen."  Patient is diarrhea is improving however he continues to be significantly nauseous and have some vomiting.  States he still does not feel well.  Renal function has improved slightly.  We will need to continue monitor and may discuss with nephrology in the morning.  Assessment and Plan: * Intractable nausea and vomiting with associated diarrhea -46 year old with 2 day history intractable N/V/D in setting of progressing CKD -Place in observation telemetry -anti-emetics,slowly advance diet -check stool studies/quick c.diff. no hx of sick contacts. Had brief IV abx during last hospitalization a few weeks ago -Was recently started on a diuretic on Thursday however had intractable nausea vomiting and this has been held given concern for dehydration -If not improving will need to consult GI for further evaluation -Strict I/O's and he is -170 mL since admission -gentle IVF as below -Hold bethanechol -Continue with antiemetics with p.o./IV Ondansetron 4 mg may need Compazine  Acute kidney injury superimposed on  chronic kidney disease Stage IV (HCC) Metabolic Acidosis -Baseline creatinine was around 2-2.5;  -however, during recent hospitalization creatinine remained around 3.9.  -Nephrology was consulted and felt this would be closer to his new baseline. He is at 4.01 yesterday and today's BUN/creatinine is 47/2.89,  so may Not have true AKI -Avoid nephrotoxic medications, contrast dyes, hypotension and dehydration to ensure adequate renal perfusion and will need to renally adjust medications -Renal ultrasound 8/12 significant for suggested medical renal disease with mild increased cortical echogenicity of the kidneys -Strict I/O's -Patient is a slight metabolic acidosis with a CO2 of 18, anion gap of 9, chloride level of 114 -gentle IVF resumed at 75 MLS per hour for 10 hours -UA with ketones and protein, similar to last UA in setting of intractable N/V  -Continue to monitor and trend and repeat CMP in a.m.  Type 2 diabetes mellitus with chronic kidney disease, with long-term current use of insulin (HCC) -No anion gap, but he did have ketones in urine and has a slight metabolic acidosis.  -Recent A1C in 8/23 was 7.7 -On 70/30, substitute with semglee decreased dose of 8 units with decreased PO intake  -Has not started his mounjaro -Continue with Sensitive NovoLog SSI and accuchecks AC  Vertebral Osteomyelitis (HCC) -No change in back pain, no leukocytosis, normal lactic acid -MRI during 8/9 admit with no evidence of recurrent OM -Continue pain control  -Off antibiotics since April -Need close ID follow-up  Anemia of Chronic Kidney Disease -Likely had a dilutional drop as patient's hemoglobin/hematocrit went from 8.8/24.0 and trended down to 7.1/21.5 -Check anemia panel in the a.m. -Continue to monitor for signs and symptoms of bleeding; no overt bleeding noted -Repeat CBC in a.m.  GAD (generalized anxiety disorder) -Continue Paroxetine 20 mg p.o. nightly, Mirtazapine 50 mg p.o. nightly and Hydroxyzine 25 mg p.o. 3 times daily as needed anxiety nausea  Essential Hypertension -Normotensive-mildly elevated, but improved from HTN urgency on prior admit and he has not taken any of his medication  -Continue home medication of Amlodipine 10mg  daily, Hydralazine 50mg  TID and Metoprolol 75mg  BID and  will Titrate as needed  -Continue monitor blood pressures per protocol and last blood pressure reading was 157/82 but he did become hypotensive 79/65 earlier  Hyperlipidemia -Continue Atorvastatin 40 mg po Daily    Obesity -Complicates overall prognosis and care -Estimated body mass index is 30.99 kg/m as calculated from the following:   Height as of this encounter: 5\' 11"  (1.803 m).   Weight as of this encounter: 100.8 kg.  -Weight Loss and Dietary Counseling given  DVT prophylaxis: heparin injection 5,000 Units Start: 06/27/22 1445 Place TED hose Start: 06/27/22 1433    Code Status: Full Code Family Communication: No family currently at bedside  Disposition Plan:  Level of care: Telemetry Medical Status is: Observation The patient will require care spanning > 2 midnights and should be moved to inpatient because: Continues to have significant nausea vomiting and did not feel as well   Consultants:  None  Procedures:  None  Antimicrobials:  Anti-infectives (From admission, onward)    None       Subjective: Seen and examined at bedside and he states he was not feeling as well today.  Continues to have some nausea and feel like he is about to vomit.  Diarrhea is slowing down.  No chest pain or shortness of breath.  Just does not feel very well and continues to have some shaking due to his back spasms  and pain.  Objective: Vitals:   06/27/22 2250 06/27/22 2320 06/28/22 0650 06/28/22 0750  BP: (!) 118/59 132/68 (!) 164/84 (!) 157/82  Pulse: 86 85 91 87  Resp:   16   Temp:   98.2 F (36.8 C) 98.3 F (36.8 C)  TempSrc:   Oral Oral  SpO2: (!) 89% 96% 91% (!) 89%  Weight:      Height:        Intake/Output Summary (Last 24 hours) at 06/28/2022 1624 Last data filed at 06/28/2022 1256 Gross per 24 hour  Intake 480 ml  Output 650 ml  Net -170 ml   Filed Weights   06/27/22 0834 06/27/22 1921  Weight: 105.2 kg 100.8 kg   Examination: Physical Exam:  Constitutional:  WN/WD obese Caucasian male currently no acute distress Respiratory: Diminished to auscultation bilaterally, no wheezing, rales, rhonchi or crackles. Normal respiratory effort and patient is not tachypenic. No accessory muscle use.  Unlabored breathing Cardiovascular: RRR, no murmurs / rubs / gallops. S1 and S2 auscultated.  1+ lower extremity edema Abdomen: Soft, slightly tender, Distended secondary body habitus. Bowel sounds positive.  GU: Deferred. Musculoskeletal: No clubbing / cyanosis of digits/nails. No joint deformity upper and lower extremities. Skin: No rashes, lesions, ulcers on limited skin evaluation. No induration; Warm and dry.  Neurologic: CN 2-12 grossly intact with no focal deficits. Romberg sign and cerebellar reflexes not assessed.  Psychiatric: Normal judgment and insight. Alert and oriented x 3. Normal mood and appropriate affect.   Data Reviewed: I have personally reviewed following labs and imaging studies  CBC: Recent Labs  Lab 06/27/22 0824 06/27/22 0933 06/28/22 0051  WBC 9.6  --  8.1  NEUTROABS 7.9*  --   --   HGB 8.8* 8.2* 7.1*  HCT 26.7* 24.0* 21.5*  MCV 90.8  --  90.7  PLT 222  --  774   Basic Metabolic Panel: Recent Labs  Lab 06/27/22 0824 06/27/22 0933 06/28/22 0051  NA 142 142 141  K 4.7 4.7 4.6  CL 111  --  114*  CO2 17*  --  18*  GLUCOSE 136*  --  131*  BUN 47*  --  47*  CREATININE 4.01*  --  3.89*  CALCIUM 9.0  --  8.0*   GFR: Estimated Creatinine Clearance: 28.1 mL/min (A) (by C-G formula based on SCr of 3.89 mg/dL (H)). Liver Function Tests: Recent Labs  Lab 06/27/22 0824  AST 17  ALT 28  ALKPHOS 167*  BILITOT 0.9  PROT 6.4*  ALBUMIN 3.4*   Recent Labs  Lab 06/27/22 0824  LIPASE 32   No results for input(s): "AMMONIA" in the last 168 hours. Coagulation Profile: Recent Labs  Lab 06/27/22 0824  INR 1.1   Cardiac Enzymes: No results for input(s): "CKTOTAL", "CKMB", "CKMBINDEX", "TROPONINI" in the last 168 hours. BNP  (last 3 results) No results for input(s): "PROBNP" in the last 8760 hours. HbA1C: No results for input(s): "HGBA1C" in the last 72 hours. CBG: Recent Labs  Lab 06/27/22 0922 06/27/22 1612 06/27/22 2315 06/28/22 0741 06/28/22 1142  GLUCAP 134* 157* 133* 122* 156*   Lipid Profile: No results for input(s): "CHOL", "HDL", "LDLCALC", "TRIG", "CHOLHDL", "LDLDIRECT" in the last 72 hours. Thyroid Function Tests: No results for input(s): "TSH", "T4TOTAL", "FREET4", "T3FREE", "THYROIDAB" in the last 72 hours. Anemia Panel: No results for input(s): "VITAMINB12", "FOLATE", "FERRITIN", "TIBC", "IRON", "RETICCTPCT" in the last 72 hours. Sepsis Labs: Recent Labs  Lab 06/27/22 0924 06/27/22 1921  LATICACIDVEN 0.7  0.9    Recent Results (from the past 240 hour(s))  SARS Coronavirus 2 by RT PCR (hospital order, performed in [facility] hospital lab) *cepheid single result test* Peripheral     Status: None   Collection Time: 06/27/22  9:03 AM   Specimen: Peripheral; Nasal Swab  Result Value Ref Range Status   SARS Coronavirus 2 by RT PCR NEGATIVE NEGATIVE Final    Comment: (NOTE) SARS-CoV-2 target nucleic acids are NOT DETECTED.  The SARS-CoV-2 RNA is generally detectable in upper and lower respiratory specimens during the acute phase of infection. The lowest concentration of SARS-CoV-2 viral copies this assay can detect is 250 copies / mL. A negative result does not preclude SARS-CoV-2 infection and should not be used as the sole basis for treatment or other patient management decisions.  A negative result may occur with improper specimen collection / handling, submission of specimen other than nasopharyngeal swab, presence of viral mutation(s) within the areas targeted by this assay, and inadequate number of viral copies (<250 copies / mL). A negative result must be combined with clinical observations, patient history, and epidemiological information.  Fact Sheet for Patients:    https://www.patel.info/  Fact Sheet for Healthcare Providers: https://hall.com/  This test is not yet approved or  cleared by the [place] FDA and has been authorized for detection and/or diagnosis of SARS-CoV-2 by FDA under an Emergency Use Authorization (EUA).  This EUA will remain in effect (meaning this test can be used) for the duration of the COVID-19 declaration under Section 564(b)(1) of the Act, 21 U.S.C. section 360bbb-3(b)(1), unless the authorization is terminated or revoked sooner.  Performed at [facility], [place] [address], [place], [place] [address]   Blood Culture (routine x 2)     Status: None (Preliminary result)   Collection Time: 06/27/22  9:08 AM   Specimen: BLOOD RIGHT WRIST  Result Value Ref Range Status   Specimen Description BLOOD RIGHT WRIST  Final   Special Requests   Final    BOTTLES DRAWN AEROBIC AND ANAEROBIC Blood Culture results may not be optimal due to an inadequate volume of blood received in culture bottles   Culture   Final    NO GROWTH < 24 HOURS Performed at [facility], [place] [address] [place], [place] [address]    Report Status PENDING  Incomplete  Blood Culture (routine x 2)     Status: None (Preliminary result)   Collection Time: 06/27/22  9:18 AM   Specimen: BLOOD LEFT FOREARM  Result Value Ref Range Status   Specimen Description BLOOD LEFT FOREARM  Final   Special Requests   Final    BOTTLES DRAWN AEROBIC AND ANAEROBIC Blood Culture results may not be optimal due to an inadequate volume of blood received in culture bottles   Culture   Final    NO GROWTH < 24 HOURS Performed at [facility], [place] [address], [place], [place] [address]    Report Status PENDING  Incomplete   Radiology Studies: CT ABDOMEN PELVIS WO CONTRAST  Result Date: 06/27/2022 CLINICAL DATA:  Abdominal pain, nausea, vomiting EXAM: CT ABDOMEN AND PELVIS WITHOUT CONTRAST TECHNIQUE: Multidetector CT  imaging of the abdomen and pelvis was performed following the standard protocol without IV contrast. RADIATION DOSE REDUCTION: This exam was performed according to the departmental dose-optimization program which includes automated exposure control, adjustment of the mA and/or kV according to patient size and/or use of iterative reconstruction technique. COMPARISON:  CT  done on 08/06/2021, renal sonogram done on 06/05/2022 FINDINGS: Lower chest: Heart is enlarged in size. Coronary artery calcifications are seen. Small bilateral pleural effusions are seen. Small patchy infiltrates are seen in both lower lung fields. Hepatobiliary: No focal abnormalities are seen in liver. Gallbladder stones are seen. There is no wall thickening in gallbladder. Pancreas: No focal abnormalities are seen. Spleen: Unremarkable. Adrenals/Urinary Tract: Adrenals are unremarkable. There is no hydronephrosis. There are no renal or ureteral stones. Urinary bladder is not distended. Stomach/Bowel: Small hiatal hernia is seen. There is moderate distention of stomach. Small bowel loops are unremarkable. Appendix is not seen. There is no significant wall thickening in colon. There is fluid in the lumen of the colon and rectum. Vascular/Lymphatic: Scattered arterial calcifications are seen. Reproductive: Prostate is unremarkable. Calcifications are seen in seminal vesicles and vas deferens. Other: There is no ascites or pneumoperitoneum. Umbilical and paraumbilical hernias containing fat are noted. Bilateral inguinal hernias containing fat are seen, larger on the left side. There is mild edema in subcutaneous plane in the abdominal wall, possibly suggesting anasarca. Musculoskeletal: Unremarkable. IMPRESSION: There is no evidence of intestinal obstruction or pneumoperitoneum. There is no hydronephrosis. There is liquid stool in the lumen of colon and rectum. This may be a normal variation or suggest nonspecific enterocolitis. There is no  significant wall thickening or pericolic stranding. Gallbladder stones. Small hiatal hernia. Small bilateral pleural effusions. There are small patchy infiltrates in both lower lung fields suggesting atelectasis/pneumonia. Cardiomegaly. Coronary artery calcifications are seen. Other findings as described in the body of the report. Electronically Signed   By: Elmer Picker M.D.   On: 06/27/2022 13:36   DG Chest Port 1 View  Result Date: 06/27/2022 CLINICAL DATA:  Sepsis. EXAM: PORTABLE CHEST 1 VIEW COMPARISON:  June 02, 2022. FINDINGS: Mild cardiomegaly is noted. Lungs are clear. Bony thorax is unremarkable. IMPRESSION: No active disease. Electronically Signed   By: Marijo Conception M.D.   On: 06/27/2022 09:36    Scheduled Meds:  amLODipine  10 mg Oral Daily   atorvastatin  40 mg Oral Daily   gabapentin  200 mg Oral BID   heparin  5,000 Units Subcutaneous Q8H   insulin aspart  0-9 Units Subcutaneous TID WC   insulin glargine-yfgn  8 Units Subcutaneous QHS   metoprolol tartrate  75 mg Oral BID   mirtazapine  15 mg Oral QHS   PARoxetine  20 mg Oral QHS   tamsulosin  0.4 mg Oral QPC supper   Continuous Infusions:  sodium chloride      LOS: 0 days   Raiford Noble, DO [facility] Available via Epic secure chat 7am-7pm After these hours, please refer to coverage provider listed on [email or web address] 06/28/2022, 4:24 PM

## 2022-06-29 DIAGNOSIS — E11319 Type 2 diabetes mellitus with unspecified diabetic retinopathy without macular edema: Secondary | ICD-10-CM | POA: Diagnosis present

## 2022-06-29 DIAGNOSIS — D631 Anemia in chronic kidney disease: Secondary | ICD-10-CM | POA: Diagnosis present

## 2022-06-29 DIAGNOSIS — I160 Hypertensive urgency: Secondary | ICD-10-CM | POA: Diagnosis present

## 2022-06-29 DIAGNOSIS — E1169 Type 2 diabetes mellitus with other specified complication: Secondary | ICD-10-CM | POA: Diagnosis present

## 2022-06-29 DIAGNOSIS — R112 Nausea with vomiting, unspecified: Secondary | ICD-10-CM | POA: Diagnosis not present

## 2022-06-29 DIAGNOSIS — I129 Hypertensive chronic kidney disease with stage 1 through stage 4 chronic kidney disease, or unspecified chronic kidney disease: Secondary | ICD-10-CM | POA: Diagnosis present

## 2022-06-29 DIAGNOSIS — Z7985 Long-term (current) use of injectable non-insulin antidiabetic drugs: Secondary | ICD-10-CM | POA: Diagnosis not present

## 2022-06-29 DIAGNOSIS — E1122 Type 2 diabetes mellitus with diabetic chronic kidney disease: Secondary | ICD-10-CM | POA: Diagnosis present

## 2022-06-29 DIAGNOSIS — Z886 Allergy status to analgesic agent status: Secondary | ICD-10-CM | POA: Diagnosis not present

## 2022-06-29 DIAGNOSIS — E872 Acidosis, unspecified: Secondary | ICD-10-CM | POA: Diagnosis present

## 2022-06-29 DIAGNOSIS — E8809 Other disorders of plasma-protein metabolism, not elsewhere classified: Secondary | ICD-10-CM | POA: Diagnosis not present

## 2022-06-29 DIAGNOSIS — D649 Anemia, unspecified: Secondary | ICD-10-CM | POA: Diagnosis not present

## 2022-06-29 DIAGNOSIS — E8770 Fluid overload, unspecified: Secondary | ICD-10-CM | POA: Diagnosis not present

## 2022-06-29 DIAGNOSIS — F411 Generalized anxiety disorder: Secondary | ICD-10-CM | POA: Diagnosis present

## 2022-06-29 DIAGNOSIS — Z6832 Body mass index (BMI) 32.0-32.9, adult: Secondary | ICD-10-CM | POA: Diagnosis not present

## 2022-06-29 DIAGNOSIS — E785 Hyperlipidemia, unspecified: Secondary | ICD-10-CM | POA: Diagnosis present

## 2022-06-29 DIAGNOSIS — E669 Obesity, unspecified: Secondary | ICD-10-CM | POA: Diagnosis present

## 2022-06-29 DIAGNOSIS — Z79899 Other long term (current) drug therapy: Secondary | ICD-10-CM | POA: Diagnosis not present

## 2022-06-29 DIAGNOSIS — N179 Acute kidney failure, unspecified: Secondary | ICD-10-CM | POA: Diagnosis present

## 2022-06-29 DIAGNOSIS — Z794 Long term (current) use of insulin: Secondary | ICD-10-CM | POA: Diagnosis not present

## 2022-06-29 DIAGNOSIS — B3781 Candidal esophagitis: Secondary | ICD-10-CM | POA: Diagnosis not present

## 2022-06-29 DIAGNOSIS — M4624 Osteomyelitis of vertebra, thoracic region: Secondary | ICD-10-CM | POA: Diagnosis present

## 2022-06-29 DIAGNOSIS — N184 Chronic kidney disease, stage 4 (severe): Secondary | ICD-10-CM | POA: Diagnosis present

## 2022-06-29 DIAGNOSIS — A084 Viral intestinal infection, unspecified: Secondary | ICD-10-CM | POA: Diagnosis present

## 2022-06-29 DIAGNOSIS — Z87891 Personal history of nicotine dependence: Secondary | ICD-10-CM | POA: Diagnosis not present

## 2022-06-29 DIAGNOSIS — I10 Essential (primary) hypertension: Secondary | ICD-10-CM | POA: Diagnosis not present

## 2022-06-29 DIAGNOSIS — Z20822 Contact with and (suspected) exposure to covid-19: Secondary | ICD-10-CM | POA: Diagnosis present

## 2022-06-29 LAB — CBC WITH DIFFERENTIAL/PLATELET
Abs Immature Granulocytes: 0.01 10*3/uL (ref 0.00–0.07)
Basophils Absolute: 0 10*3/uL (ref 0.0–0.1)
Basophils Relative: 1 %
Eosinophils Absolute: 0.3 10*3/uL (ref 0.0–0.5)
Eosinophils Relative: 5 %
HCT: 21.5 % — ABNORMAL LOW (ref 39.0–52.0)
Hemoglobin: 7.2 g/dL — ABNORMAL LOW (ref 13.0–17.0)
Immature Granulocytes: 0 %
Lymphocytes Relative: 33 %
Lymphs Abs: 2.1 10*3/uL (ref 0.7–4.0)
MCH: 30.3 pg (ref 26.0–34.0)
MCHC: 33.5 g/dL (ref 30.0–36.0)
MCV: 90.3 fL (ref 80.0–100.0)
Monocytes Absolute: 0.8 10*3/uL (ref 0.1–1.0)
Monocytes Relative: 12 %
Neutro Abs: 3.2 10*3/uL (ref 1.7–7.7)
Neutrophils Relative %: 49 %
Platelets: 176 10*3/uL (ref 150–400)
RBC: 2.38 MIL/uL — ABNORMAL LOW (ref 4.22–5.81)
RDW: 12.3 % (ref 11.5–15.5)
WBC: 6.5 10*3/uL (ref 4.0–10.5)
nRBC: 0 % (ref 0.0–0.2)

## 2022-06-29 LAB — COMPREHENSIVE METABOLIC PANEL
ALT: 17 U/L (ref 0–44)
AST: 10 U/L — ABNORMAL LOW (ref 15–41)
Albumin: 2.8 g/dL — ABNORMAL LOW (ref 3.5–5.0)
Alkaline Phosphatase: 126 U/L (ref 38–126)
Anion gap: 7 (ref 5–15)
BUN: 42 mg/dL — ABNORMAL HIGH (ref 6–20)
CO2: 21 mmol/L — ABNORMAL LOW (ref 22–32)
Calcium: 8.3 mg/dL — ABNORMAL LOW (ref 8.9–10.3)
Chloride: 112 mmol/L — ABNORMAL HIGH (ref 98–111)
Creatinine, Ser: 3.74 mg/dL — ABNORMAL HIGH (ref 0.61–1.24)
GFR, Estimated: 19 mL/min — ABNORMAL LOW (ref >60)
Glucose, Bld: 118 mg/dL — ABNORMAL HIGH (ref 70–99)
Potassium: 4.4 mmol/L (ref 3.5–5.1)
Sodium: 140 mmol/L (ref 135–145)
Total Bilirubin: 0.4 mg/dL (ref 0.3–1.2)
Total Protein: 5.2 g/dL — ABNORMAL LOW (ref 6.5–8.1)

## 2022-06-29 LAB — PHOSPHORUS: Phosphorus: 4.1 mg/dL (ref 2.5–4.6)

## 2022-06-29 LAB — IRON AND TIBC
Iron: 56 ug/dL (ref 45–182)
Saturation Ratios: 23 % (ref 17.9–39.5)
TIBC: 246 ug/dL — ABNORMAL LOW (ref 250–450)
UIBC: 190 ug/dL

## 2022-06-29 LAB — FERRITIN: Ferritin: 110 ng/mL (ref 24–336)

## 2022-06-29 LAB — GLUCOSE, CAPILLARY
Glucose-Capillary: 93 mg/dL (ref 70–99)
Glucose-Capillary: 111 mg/dL — ABNORMAL HIGH (ref 70–99)
Glucose-Capillary: 128 mg/dL — ABNORMAL HIGH (ref 70–99)
Glucose-Capillary: 161 mg/dL — ABNORMAL HIGH (ref 70–99)
Glucose-Capillary: 127 mg/dL — ABNORMAL HIGH (ref 70–99)

## 2022-06-29 LAB — MAGNESIUM: Magnesium: 2 mg/dL (ref 1.7–2.4)

## 2022-06-29 MED ORDER — ALBUMIN HUMAN 25 % IV SOLN
25.0000 g | Freq: Once | INTRAVENOUS | Status: AC
  Administered 2022-06-29: 25 g via INTRAVENOUS
  Filled 2022-06-29: qty 100

## 2022-06-29 MED ORDER — FUROSEMIDE 10 MG/ML IJ SOLN
40.0000 mg | Freq: Once | INTRAMUSCULAR | Status: AC
  Administered 2022-06-29: 40 mg via INTRAVENOUS
  Filled 2022-06-29: qty 4

## 2022-06-29 MED ORDER — SODIUM BICARBONATE 650 MG PO TABS
650.0000 mg | ORAL_TABLET | Freq: Two times a day (BID) | ORAL | Status: DC
  Administered 2022-06-29 – 2022-07-02 (×6): 650 mg via ORAL
  Filled 2022-06-29 (×6): qty 1

## 2022-06-29 NOTE — Consult Note (Signed)
Referring Provider:  [facility] Primary Care Physician:  Madelon Lips, MD Primary Gastroenterologist: Sadie Haber PCP  Reason for Consultation: Nausea and vomiting  HPI: Jose [name] is a 46 y.o. male with past medical history of stage IV chronic kidney disease, diabetes, hypertension, history of thoracic osteomyelitis presented to the hospital with diarrhea, nausea and vomiting.  Blood work on initial evaluation September 3 showed stable anemia with hemoglobin of 8.8 otherwise normal CBC, elevated alkaline phosphatase 167 otherwise normal LFTs, creatinine of 4.01 which is at his baseline, normal INR, normal lipase.  GI abdomen pelvis without contrast showed liquid stool in the lumen of the colon and rectum concerning for enterocolitis as well as small pleural effusion, bilateral patchy infiltrate in both lungs and cardiomegaly.  Also showed gallstones.  GI is consulted for further evaluation.  Patient seen and examined at bedside.  According to patient, he has been having intermittent diarrhea for last 2 weeks.  Also having nausea and vomiting.  Decided to come to the hospital because of worsening symptoms for last few days.  Symptoms are improving now.  Only 2 bowel movements yesterday.  No bowel movement today.  No episodes of vomiting today.  Patient with history of GI bleed requiring admission in October 2022.  EGD at that time showed LA grade D esophagitis.  Gastric biopsies were negative for H. pylori.  Esophageal biopsies were positive for Candida esophagitis.   Repeat EGD in August 24, 2021 showed improvement in esophagitis.    Past Medical History:  Diagnosis Date   AKI (acute kidney injury) ([place])    GAD (generalized anxiety disorder)    Hyperlipidemia    Macular degeneration, bilateral    Retinopathy    Type II diabetes mellitus with complication, uncontrolled    retinopathy, neuropathy, microalbuminuria    Past Surgical History:  Procedure Laterality Date   APPENDECTOMY      BIOPSY  08/10/2021   Procedure: BIOPSY;  Surgeon: Otis Brace, MD;  Location: [place];  Service: Gastroenterology;;   BUBBLE STUDY  07/29/2021   Procedure: BUBBLE STUDY;  Surgeon: Jerline Pain, MD;  Location: [place] [facility];  Service: Cardiovascular;;   ESOPHAGOGASTRODUODENOSCOPY (EGD) WITH PROPOFOL N/A 08/10/2021   Procedure: ESOPHAGOGASTRODUODENOSCOPY (EGD) WITH PROPOFOL;  Surgeon: Otis Brace, MD;  Location: [place];  Service: Gastroenterology;  Laterality: N/A;   ESOPHAGOGASTRODUODENOSCOPY (EGD) WITH PROPOFOL N/A 08/24/2021   Procedure: ESOPHAGOGASTRODUODENOSCOPY (EGD) WITH PROPOFOL;  Surgeon: Otis Brace, MD;  Location: [place];  Service: Gastroenterology;  Laterality: N/A;   HERNIA REPAIR     IR FLUORO GUIDED NEEDLE PLC ASPIRATION/INJECTION LOC  07/28/2021   LUMBAR LAMINECTOMY/DECOMPRESSION MICRODISCECTOMY N/A 08/07/2021   Procedure: THORACIC FOUR - THORACIC FIVE LAMINECTOMY/DECOMPRESSION OF SPINAL CORD, DEBRIDEMENT OF ABSCESS, MICRODISCECTOMY, INTRAOPERATIVE ULTRASOUND;  Surgeon: Consuella Lose, MD;  Location: [place];  Service: Neurosurgery;  Laterality: N/A;   TEE WITHOUT CARDIOVERSION N/A 07/29/2021   Procedure: TRANSESOPHAGEAL ECHOCARDIOGRAM (TEE);  Surgeon: Jerline Pain, MD;  Location: [facility];  Service: Cardiovascular;  Laterality: N/A;   TRIGGER FINGER RELEASE Right 10/25/2019   Procedure: RIGHT INDEX FINGER RELEASE TRIGGER FINGER/A-1 PULLEY;  Surgeon: Daryll Brod, MD;  Location: [place];  Service: Orthopedics;  Laterality: Right;  IV REGIONAL FOREARM BLOCK    Prior to Admission medications   Medication Sig Start Date End Date Taking? Authorizing Provider  Acetaminophen 500 MG capsule Take 500-1,000 mg by mouth every 6 (six) hours as needed for mild pain.   Yes [provider]  albuterol (VENTOLIN HFA) 108 (  90 Base) MCG/ACT inhaler Inhale 1 puff into the lungs every 4 (four) hours as needed for wheezing or  shortness of breath. 05/27/21  Yes [provider]  amLODipine (NORVASC) 10 MG tablet Take 1 tablet (10 mg total) by mouth daily. 06/09/22  Yes Elgergawy, Silver Huguenin, MD  atorvastatin (LIPITOR) 40 MG tablet Take 40 mg by mouth daily.   Yes [provider]  bethanechol (URECHOLINE) 10 MG tablet Take 1 tablet (10 mg total) by mouth 3 (three) times daily. 09/21/21  Yes Love, Ivan Anchors, PA-C  cholecalciferol (VITAMIN D3) 25 MCG (1000 UNIT) tablet Take 1,000 Units by mouth daily.   Yes [provider]  gabapentin (NEURONTIN) 100 MG capsule Take 2 capsules (200 mg total) by mouth 2 (two) times daily. 09/21/21  Yes Love, Ivan Anchors, PA-C  hydrALAZINE (APRESOLINE) 50 MG tablet Take 1 tablet (50 mg total) by mouth every 8 (eight) hours. 06/08/22  Yes Elgergawy, Silver Huguenin, MD  hydrOXYzine (ATARAX) 25 MG tablet Take 1 tablet (25 mg total) by mouth 3 (three) times daily as needed for anxiety or nausea. 01/13/22  Yes Lovorn, Jinny Blossom, MD  insulin NPH-regular Human (70-30) 100 UNIT/ML injection Inject 8-12 Units into the skin See admin instructions. Taking 12  units in the Am and 8 units at night   Yes [provider]  Metoprolol Tartrate 75 MG TABS Take 75 mg by mouth 2 (two) times daily. 06/08/22  Yes Elgergawy, Silver Huguenin, MD  mirtazapine (REMERON) 15 MG tablet Take 1 tablet (15 mg total) by mouth at bedtime. 09/21/21  Yes Love, Ivan Anchors, PA-C  ondansetron (ZOFRAN-ODT) 4 MG disintegrating tablet Take 4 mg by mouth daily. 12/28/21  Yes [provider]  oxyCODONE (OXY IR/ROXICODONE) 5 MG immediate release tablet Take 1 tablet (5 mg total) by mouth every 6 (six) hours as needed for severe pain or breakthrough pain. 06/08/22  Yes Elgergawy, Silver Huguenin, MD  PARoxetine (PAXIL) 20 MG tablet Take 1 tablet (20 mg total) by mouth at bedtime. 09/21/21  Yes Love, Ivan Anchors, PA-C  polyethylene glycol (MIRALAX / GLYCOLAX) 17 g packet Take 17 g by mouth daily as needed for moderate constipation, mild  constipation or severe constipation. 08/29/21  Yes Dahal, Marlowe Aschoff, MD  tamsulosin (FLOMAX) 0.4 MG CAPS capsule Take 1 capsule (0.4 mg total) by mouth daily after supper. 09/21/21  Yes Love, Ivan Anchors, PA-C  tobramycin (TOBREX) 0.3 % ophthalmic solution Place 1 drop into both eyes as directed. Retina injections 11/03/21  Yes [provider]  torsemide (DEMADEX) 20 MG tablet Take 20 mg by mouth 2 (two) times daily. 06/24/22  Yes [provider]  Continuous Blood Gluc Sensor (FREESTYLE LIBRE 3 SENSOR) MISC 1 Device by Does not apply route every 14 (fourteen) days. Apply 1 sensor on upper arm every 14 days for continuous glucose monitoring 04/29/22   Elayne Snare, MD  Insulin Pen Needle (PENTIPS) 32G X 4 MM MISC use as directed 09/21/21   Love, Ivan Anchors, PA-C  tirzepatide [facility]) 5 MG/0.5ML Pen Inject 5 mg into the skin once a week. Patient not taking: Reported on 06/22/2022 04/30/22   Elayne Snare, MD    Scheduled Meds:  amLODipine  10 mg Oral Daily   atorvastatin  40 mg Oral Daily   gabapentin  200 mg Oral BID   heparin  5,000 Units Subcutaneous Q8H   insulin aspart  0-9 Units Subcutaneous TID WC   insulin glargine-yfgn  8 Units Subcutaneous QHS   metoprolol tartrate  75 mg Oral BID   mirtazapine  15 mg Oral QHS   PARoxetine  20 mg Oral QHS   tamsulosin  0.4 mg Oral QPC supper   Continuous Infusions: PRN Meds:.acetaminophen **OR** acetaminophen, albuterol, HYDROmorphone (DILAUDID) injection, hydrOXYzine, ondansetron **OR** ondansetron (ZOFRAN) IV, oxyCODONE  Allergies as of 06/27/2022 - Review Complete 06/27/2022  Allergen Reaction Noted   Cranberry Itching 08/06/2021   Hm lidocaine patch [lidocaine] Dermatitis 08/24/2021   Melatonin Other (See Comments) 08/26/2021   Nsaids Other (See Comments) 41/96/2229   Trulicity [dulaglutide] Other (See Comments) 06/27/2022    Family History  Problem Relation Age of Onset   Diabetes Mother    Hyperlipidemia Mother    Stroke Mother     Diabetes Father    Hyperlipidemia Brother    Stroke Brother    ADD / ADHD Brother    ADD / ADHD Son     Social History   Socioeconomic History   Marital status: Married    Spouse name: Not on file   Number of children: 2   Years of education: Not on file   Highest education level: Bachelor's degree (e.g., BA, AB, BS)  Occupational History   Occupation: Multimedia programmer: [employer]  Tobacco Use   Smoking status: Former    Packs/day: 1.00    Years: 20.00    Total pack years: 20.00    Types: Cigarettes    Quit date: 07/2020    Years since quitting: 1.9   Smokeless tobacco: Never  Vaping Use   Vaping Use: Never used  Substance and Sexual Activity   Alcohol use: Not Currently   Drug use: Never   Sexual activity: Yes    Partners: Female    Birth control/protection: None    Comment: with monogamous partner  Other Topics Concern   Not on file  Social History Narrative   Pt has lived 59 of life in [place]. Lives at home with partner, 2 kids, 8 cats, and 1 dog.    Social Determinants of Health   Financial Resource Strain: Low Risk  (05/05/2018)   Overall Financial Resource Strain (CARDIA)    Difficulty of Paying Living Expenses: Not hard at all  Food Insecurity: No Food Insecurity (05/05/2018)   Hunger Vital Sign    Worried About Running Out of Food in the Last Year: Never true    Ran Out of Food in the Last Year: Never true  Transportation Needs: No Transportation Needs (05/05/2018)   PRAPARE - Hydrologist (Medical): No    Lack of Transportation (Non-Medical): No  Physical Activity: Insufficiently Active (05/05/2018)   Exercise Vital Sign    Days of Exercise per Week: 4 days    Minutes of Exercise per Session: 20 min  Stress: Not on file  Social Connections: Moderately Isolated (05/05/2018)   Social Connection and Isolation Panel [NHANES]    Frequency of Communication with Friends and Family: Once a week     Frequency of Social Gatherings with Friends and Family: Once a week    Attends Religious Services: Never    Marine scientist or Organizations: No    Attends Archivist Meetings: Never    Marital Status: Living with partner  Intimate Partner Violence: Not At Risk (05/05/2018)   Humiliation, Afraid, Rape, and Kick questionnaire    Fear of Current or Ex-Partner: No    Emotionally Abused: No    Physically Abused: No  Sexually Abused: No    Review of Systems: All negative except as stated above in HPI.  Physical Exam: Vital signs: Vitals:   06/29/22 0548 06/29/22 0834  BP: (!) 150/81 (!) 148/81  Pulse: 70 73  Resp: 17 18  Temp:  97.8 F (36.6 C)  SpO2: 91% 91%   Last BM Date : 06/28/22 General:   Alert,  Well-developed, well-nourished, pleasant and cooperative in NAD Lungs:  Clear throughout to auscultation.   No wheezes, crackles, or rhonchi. No acute distress. Heart:  Regular rate and rhythm; no murmurs, clicks, rubs,  or gallops. Abdomen: Soft, nontender, nondistended, bowel sounds present, no peritoneal signs Rectal:  Deferred  GI:  Lab Results: Recent Labs    06/27/22 0824 06/27/22 0933 06/28/22 0051 06/29/22 0115  WBC 9.6  --  8.1 6.5  HGB 8.8* 8.2* 7.1* 7.2*  HCT 26.7* 24.0* 21.5* 21.5*  PLT 222  --  205 176   BMET Recent Labs    06/27/22 0824 06/27/22 0933 06/28/22 0051 06/29/22 0115  NA 142 142 141 140  K 4.7 4.7 4.6 4.4  CL 111  --  114* 112*  CO2 17*  --  18* 21*  GLUCOSE 136*  --  131* 118*  BUN 47*  --  47* 42*  CREATININE 4.01*  --  3.89* 3.74*  CALCIUM 9.0  --  8.0* 8.3*   LFT Recent Labs    06/29/22 0115  PROT 5.2*  ALBUMIN 2.8*  AST 10*  ALT 17  ALKPHOS 126  BILITOT 0.4   PT/INR Recent Labs    06/27/22 0824  LABPROT 14.2  INR 1.1     Studies/Results: No results found.  Impression/Plan: -Nausea vomiting and diarrhea.  Most likely gastroenteritis.  Symptoms improving.  No bowel movement today.  No  vomiting today. -Stage IV chronic kidney disease -Chronic anemia -History of esophagitis.  Recommendations ------------------------- -Patient's symptoms are improving.  Recommend supportive care for now. -GI pathogen panel and C. difficile if recurrence of diarrhea -Advance diet as tolerated -GI will follow  Otis Brace MD, FACP 06/29/2022, 4:05 PM  Contact #  [phone]     LOS: 0 days   Otis Brace  MD, [place] 06/29/2022, 1:55 PM  Contact #  [phone]

## 2022-06-29 NOTE — Consult Note (Signed)
[facility] Renal Consultation Note  Requesting MD: Raiford Noble, MD Indication for Consultation:  AKI   Chief complaint: n/v and diarrhea  HPI:  Jose [name] is a 46 y.o. male with a history of CKD, diabetes, hypertension, prior thoracic osteomyelitis who presented to the hospital with nausea with vomiting and diarrhea.  His creatinine was noted to be elevated at 4.01.  Creatinine history is below.  From mid August to present his creatinine has been 4.  Earlier this year creatinine between 2.1 and 2.48.  He has followed with Dr. Hollie Salk at our office.  He received fluids initially and these were stopped.  Suspect ins/outs not fully captured but had 650 ml UOP over 9/4 documented.  Nephrology is consulted for assistance with management.  He recently saw Dr. Hollie Salk on 8/31 for hospital follow-up.  On 06/24/22 his Cr was 3.61, BUN 42, eGFR 20, K 5.6, bicarb 17.  She wasn't sure how much recovery to really expect.  She had started a taper off minoxidil and had started torsemide 40 mg daily.  He states he really only took the torsemide for a day (because he later had n/v and wasn't able to keep it down).  He's not completely off the minodixil; he had been on it for a few weeks.  Diarrhea is a little better.  N/v is better but he states has gotten lots of PRN's. He does feel like he's having trouble getting a deep breath and tightness in his legs.  He was ordered fluids for 10 hours; these expired 13 hours or so ago and are still running.  New RN has just picked up his care and is at bedside.  She turned the fluids off.  His mother and brother have had kidney disease; his brother's was after a heart attack.      Creat  Date/Time Value Ref Range Status  12/18/2021 02:07 PM 2.48 (H) 0.60 - 1.29 mg/dL Final  11/13/2021 02:05 PM 2.13 (H) 0.60 - 1.29 mg/dL Final   Creatinine, Ser  Date/Time Value Ref Range Status  06/29/2022 01:15 AM 3.74 (H) 0.61 - 1.24 mg/dL Final  06/28/2022 12:51 AM 3.89  (H) 0.61 - 1.24 mg/dL Final  06/27/2022 08:24 AM 4.01 (H) 0.61 - 1.24 mg/dL Final  06/08/2022 04:51 AM 3.97 (H) 0.61 - 1.24 mg/dL Final  06/07/2022 06:54 AM 3.77 (H) 0.61 - 1.24 mg/dL Final  06/06/2022 03:25 AM 3.86 (H) 0.61 - 1.24 mg/dL Final  06/05/2022 03:10 AM 3.95 (H) 0.61 - 1.24 mg/dL Final  06/04/2022 03:32 AM 3.56 (H) 0.61 - 1.24 mg/dL Final  06/03/2022 11:20 AM 3.33 (H) 0.61 - 1.24 mg/dL Final  06/02/2022 12:12 PM 3.78 (H) 0.61 - 1.24 mg/dL Final  11/10/2021 10:14 AM 2.21 (H) 0.61 - 1.24 mg/dL Final  09/21/2021 03:18 AM 1.36 (H) 0.61 - 1.24 mg/dL Final  09/14/2021 03:34 AM 1.23 0.61 - 1.24 mg/dL Final  09/10/2021 12:39 AM 1.41 (H) 0.61 - 1.24 mg/dL Final  09/09/2021 12:24 PM 1.40 (H) 0.61 - 1.24 mg/dL Final  09/07/2021 03:35 AM 1.23 0.61 - 1.24 mg/dL Final  09/03/2021 04:04 AM 1.33 (H) 0.61 - 1.24 mg/dL Final  09/02/2021 04:20 PM 1.31 (H) 0.61 - 1.24 mg/dL Final  08/31/2021 11:28 AM 1.35 (H) 0.61 - 1.24 mg/dL Final  08/31/2021 03:59 AM 1.34 (H) 0.61 - 1.24 mg/dL Final  08/29/2021 03:53 AM 1.56 (H) 0.61 - 1.24 mg/dL Final  08/28/2021 03:46 AM 1.52 (H) 0.61 - 1.24 mg/dL Final  08/27/2021 03:20 AM  1.64 (H) 0.61 - 1.24 mg/dL Final  08/26/2021 03:45 AM 1.83 (H) 0.61 - 1.24 mg/dL Final  08/25/2021 05:25 AM 2.02 (H) 0.61 - 1.24 mg/dL Final  08/24/2021 04:35 AM 2.18 (H) 0.61 - 1.24 mg/dL Final  08/23/2021 04:50 AM 2.21 (H) 0.61 - 1.24 mg/dL Final  08/22/2021 05:30 AM 1.99 (H) 0.61 - 1.24 mg/dL Final  08/21/2021 05:15 AM 2.26 (H) 0.61 - 1.24 mg/dL Final  08/20/2021 03:27 AM 2.94 (H) 0.61 - 1.24 mg/dL Final  08/19/2021 09:55 PM 3.00 (H) 0.61 - 1.24 mg/dL Final  08/19/2021 03:20 PM 3.27 (H) 0.61 - 1.24 mg/dL Final  08/19/2021 04:30 AM 3.53 (H) 0.61 - 1.24 mg/dL Final  08/18/2021 03:34 AM 3.73 (H) 0.61 - 1.24 mg/dL Final  08/17/2021 04:00 AM 4.53 (H) 0.61 - 1.24 mg/dL Final  08/16/2021 04:54 AM 4.43 (H) 0.61 - 1.24 mg/dL Final  08/15/2021 05:45 AM 4.23 (H) 0.61 - 1.24 mg/dL Final   08/14/2021 05:00 PM 3.78 (H) 0.61 - 1.24 mg/dL Final  08/14/2021 08:01 AM 3.52 (H) 0.61 - 1.24 mg/dL Final    Comment:    REPEATED TO VERIFY  08/13/2021 04:15 AM 1.89 (H) 0.61 - 1.24 mg/dL Final  08/12/2021 02:33 AM 1.85 (H) 0.61 - 1.24 mg/dL Final  08/11/2021 03:08 AM 1.92 (H) 0.61 - 1.24 mg/dL Final  08/10/2021 03:41 AM 2.34 (H) 0.61 - 1.24 mg/dL Final  08/09/2021 03:11 AM 2.48 (H) 0.61 - 1.24 mg/dL Final    Comment:    DELTA CHECK NOTED  08/07/2021 04:47 AM 1.47 (H) 0.61 - 1.24 mg/dL Final  08/06/2021 09:37 AM 1.50 (H) 0.61 - 1.24 mg/dL Final  08/03/2021 05:41 AM 2.20 (H) 0.61 - 1.24 mg/dL Final  08/02/2021 02:21 PM 2.32 (H) 0.61 - 1.24 mg/dL Final  08/01/2021 10:49 AM 2.08 (H) 0.61 - 1.24 mg/dL Final  07/31/2021 05:31 AM 1.73 (H) 0.61 - 1.24 mg/dL Final  07/29/2021 05:17 AM 1.92 (H) 0.61 - 1.24 mg/dL Final  07/28/2021 05:20 AM 1.93 (H) 0.61 - 1.24 mg/dL Final     PMHx:   Past Medical History:  Diagnosis Date   AKI (acute kidney injury) ([place])    GAD (generalized anxiety disorder)    Hyperlipidemia    Macular degeneration, bilateral    Retinopathy    Type II diabetes mellitus with complication, uncontrolled    retinopathy, neuropathy, microalbuminuria    Past Surgical History:  Procedure Laterality Date   APPENDECTOMY     BIOPSY  08/10/2021   Procedure: BIOPSY;  Surgeon: Otis Brace, MD;  Location: [place];  Service: Gastroenterology;;   BUBBLE STUDY  07/29/2021   Procedure: BUBBLE STUDY;  Surgeon: Jerline Pain, MD;  Location: [facility];  Service: Cardiovascular;;   ESOPHAGOGASTRODUODENOSCOPY (EGD) WITH PROPOFOL N/A 08/10/2021   Procedure: ESOPHAGOGASTRODUODENOSCOPY (EGD) WITH PROPOFOL;  Surgeon: Otis Brace, MD;  Location: [facility];  Service: Gastroenterology;  Laterality: N/A;   ESOPHAGOGASTRODUODENOSCOPY (EGD) WITH PROPOFOL N/A 08/24/2021   Procedure: ESOPHAGOGASTRODUODENOSCOPY (EGD) WITH PROPOFOL;  Surgeon: Otis Brace, MD;   Location: [place];  Service: Gastroenterology;  Laterality: N/A;   HERNIA REPAIR     IR FLUORO GUIDED NEEDLE PLC ASPIRATION/INJECTION LOC  07/28/2021   LUMBAR LAMINECTOMY/DECOMPRESSION MICRODISCECTOMY N/A 08/07/2021   Procedure: THORACIC FOUR - THORACIC FIVE LAMINECTOMY/DECOMPRESSION OF SPINAL CORD, DEBRIDEMENT OF ABSCESS, MICRODISCECTOMY, INTRAOPERATIVE ULTRASOUND;  Surgeon: Consuella Lose, MD;  Location: [place];  Service: Neurosurgery;  Laterality: N/A;   TEE WITHOUT CARDIOVERSION N/A 07/29/2021   Procedure: TRANSESOPHAGEAL ECHOCARDIOGRAM (TEE);  Surgeon: Jerline Pain,  MD;  Location: [place];  Service: Cardiovascular;  Laterality: N/A;   TRIGGER FINGER RELEASE Right 10/25/2019   Procedure: RIGHT INDEX FINGER RELEASE TRIGGER FINGER/A-1 PULLEY;  Surgeon: Daryll Brod, MD;  Location: [place];  Service: Orthopedics;  Laterality: Right;  IV REGIONAL FOREARM BLOCK    Family Hx:  Family History  Problem Relation Age of Onset   Diabetes Mother    Hyperlipidemia Mother    Stroke Mother    Diabetes Father    Hyperlipidemia Brother    Stroke Brother    ADD / ADHD Brother    ADD / ADHD Son   CKD - mother and brother  Social History:  reports that he quit smoking about 23 months ago. His smoking use included cigarettes. He has a 20.00 pack-year smoking history. He has never used smokeless tobacco. He reports that he does not currently use alcohol. He reports that he does not use drugs.  Allergies:  Allergies  Allergen Reactions   Cranberry Itching   Hm Lidocaine Patch [Lidocaine] Dermatitis    Blisters skin    Melatonin Other (See Comments)    nightmares   Nsaids Other (See Comments)    Stage 3 kidney disease   Trulicity [Dulaglutide] Other (See Comments)    Night sweats  Uncontrolled tempeture     Medications: Prior to Admission medications   Medication Sig Start Date End Date Taking? Authorizing Provider  Acetaminophen 500 MG capsule Take 500-1,000  mg by mouth every 6 (six) hours as needed for mild pain.   Yes [provider]  albuterol (VENTOLIN HFA) 108 (90 Base) MCG/ACT inhaler Inhale 1 puff into the lungs every 4 (four) hours as needed for wheezing or shortness of breath. 05/27/21  Yes [provider]  amLODipine (NORVASC) 10 MG tablet Take 1 tablet (10 mg total) by mouth daily. 06/09/22  Yes Elgergawy, Silver Huguenin, MD  atorvastatin (LIPITOR) 40 MG tablet Take 40 mg by mouth daily.   Yes [provider]  bethanechol (URECHOLINE) 10 MG tablet Take 1 tablet (10 mg total) by mouth 3 (three) times daily. 09/21/21  Yes Love, Ivan Anchors, PA-C  cholecalciferol (VITAMIN D3) 25 MCG (1000 UNIT) tablet Take 1,000 Units by mouth daily.   Yes [provider]  gabapentin (NEURONTIN) 100 MG capsule Take 2 capsules (200 mg total) by mouth 2 (two) times daily. 09/21/21  Yes Love, Ivan Anchors, PA-C  hydrALAZINE (APRESOLINE) 50 MG tablet Take 1 tablet (50 mg total) by mouth every 8 (eight) hours. 06/08/22  Yes Elgergawy, Silver Huguenin, MD  hydrOXYzine (ATARAX) 25 MG tablet Take 1 tablet (25 mg total) by mouth 3 (three) times daily as needed for anxiety or nausea. 01/13/22  Yes Lovorn, Jinny Blossom, MD  insulin NPH-regular Human (70-30) 100 UNIT/ML injection Inject 8-12 Units into the skin See admin instructions. Taking 12  units in the Am and 8 units at night   Yes [provider]  Metoprolol Tartrate 75 MG TABS Take 75 mg by mouth 2 (two) times daily. 06/08/22  Yes Elgergawy, Silver Huguenin, MD  mirtazapine (REMERON) 15 MG tablet Take 1 tablet (15 mg total) by mouth at bedtime. 09/21/21  Yes Love, Ivan Anchors, PA-C  ondansetron (ZOFRAN-ODT) 4 MG disintegrating tablet Take 4 mg by mouth daily. 12/28/21  Yes [provider]  oxyCODONE (OXY IR/ROXICODONE) 5 MG immediate release tablet Take 1 tablet (5 mg total) by mouth every 6 (six) hours as needed for severe pain or breakthrough pain. 06/08/22  Yes  Elgergawy, Silver Huguenin, MD  PARoxetine (PAXIL) 20  MG tablet Take 1 tablet (20 mg total) by mouth at bedtime. 09/21/21  Yes Love, Ivan Anchors, PA-C  polyethylene glycol (MIRALAX / GLYCOLAX) 17 g packet Take 17 g by mouth daily as needed for moderate constipation, mild constipation or severe constipation. 08/29/21  Yes Dahal, Marlowe Aschoff, MD  tamsulosin (FLOMAX) 0.4 MG CAPS capsule Take 1 capsule (0.4 mg total) by mouth daily after supper. 09/21/21  Yes Love, Ivan Anchors, PA-C  tobramycin (TOBREX) 0.3 % ophthalmic solution Place 1 drop into both eyes as directed. Retina injections 11/03/21  Yes [provider]  torsemide (DEMADEX) 20 MG tablet Take 20 mg by mouth 2 (two) times daily. 06/24/22  Yes [provider]  Continuous Blood Gluc Sensor (FREESTYLE LIBRE 3 SENSOR) MISC 1 Device by Does not apply route every 14 (fourteen) days. Apply 1 sensor on upper arm every 14 days for continuous glucose monitoring 04/29/22   Elayne Snare, MD  Insulin Pen Needle (PENTIPS) 32G X 4 MM MISC use as directed 09/21/21   Love, Ivan Anchors, PA-C  tirzepatide [facility]) 5 MG/0.5ML Pen Inject 5 mg into the skin once a week. Patient not taking: Reported on 06/22/2022 04/30/22   Elayne Snare, MD   I have reviewed the patient's current and reported prior to admission medications.  Labs:     Latest Ref Rng & Units 06/29/2022    1:15 AM 06/28/2022   12:51 AM 06/27/2022    9:33 AM  BMP  Glucose 70 - 99 mg/dL 118  131    BUN 6 - 20 mg/dL 42  47    Creatinine 0.61 - 1.24 mg/dL 3.74  3.89    Sodium 135 - 145 mmol/L 140  141  142   Potassium 3.5 - 5.1 mmol/L 4.4  4.6  4.7   Chloride 98 - 111 mmol/L 112  114    CO2 22 - 32 mmol/L 21  18    Calcium 8.9 - 10.3 mg/dL 8.3  8.0      Urinalysis    Component Value Date/Time   COLORURINE STRAW (A) 06/27/2022 1306   APPEARANCEUR CLEAR 06/27/2022 1306   APPEARANCEUR Clear 05/05/2018 1211   LABSPEC 1.010 06/27/2022 1306   PHURINE 5.0 06/27/2022 1306   GLUCOSEU 50 (A) 06/27/2022 1306   HGBUR SMALL (A) 06/27/2022 1306   BILIRUBINUR  NEGATIVE 06/27/2022 1306   BILIRUBINUR negative 09/12/2018 0919   BILIRUBINUR Negative 05/05/2018 1211   KETONESUR 20 (A) 06/27/2022 1306   PROTEINUR 100 (A) 06/27/2022 1306   UROBILINOGEN 1.0 09/12/2018 0919   NITRITE NEGATIVE 06/27/2022 1306   LEUKOCYTESUR NEGATIVE 06/27/2022 1306     ROS:  Pertinent items noted in HPI and remainder of comprehensive ROS otherwise negative.  Physical Exam: Vitals:   06/29/22 0834 06/29/22 1401  BP: (!) 148/81 130/76  Pulse: 73 72  Resp: 18   Temp: 97.8 F (36.6 C) 98.4 F (36.9 C)  SpO2: 91% 96%     General: adult male in bed in NAD; he is shaking and appears uncomfortable; rn at bedside giving a pain med HEENT: NCAT Eyes: EOMI sclera anicteric Neck: supple trachea midline  Heart: S1S2 no rub Lungs: clear to auscultation; unlabored at rest Abdomen: soft/nt/nd/obese habitus Extremities: 2+ edema; compression stockings on  Skin: no rash on extremities exposed Psych normal mood and affect Neuro:  alert and oriented x 3 provides hx and follows commands  Assessment/Plan:  # CKD stage IV   - Likely  2/2 DM and HTN   - with overload, will give lasix with albumin;  he's been getting fluids all day - may be able to transition to PO tomorrow - depends on PO tolerance - would limit gabapentin to no more than 300 mg over the course of a day given his CKD   # HTN  - controlled on current regimen    # Metabolic acidosis - From Diarrhea and CKD - start sodium bicarbonate 650 mg BID for now  # Diarrhea  - covid was negative - work-up per primary team   # Normocytic anemia - Suspect contribution from his CKD - check iron stores - anticipate ESA   # DM type 2  - per primary team    # Chills  - shaking on my exam  - recent osteo - work-up per primary team   Disposition - would continue inpatient monitoring; intolerant of PO and relying on frequent PRN's  Claudia Desanctis 06/29/2022, 5:20 PM

## 2022-06-29 NOTE — Progress Notes (Signed)
PROGRESS NOTE    Jose [name]  ZOX:[id number] DOB: 19-Sep-1976 DOA: 06/27/2022 PCP: Madelon Lips, MD   Brief Narrative:  The patient is a obese Caucasian male with a past medical history significant for but not limited to hypertension, hyperlipidemia, diabetes mellitus type 2, history of chronic kidney disease stage IV, history of thoracic osteomyelitis/discitis as well as other comorbidities who presented to the ED with complaints of intractable nausea and vomiting with associated loose bowel movements and diarrhea as well.  He states that he has noticed that he may continue to retain some fluid since he got discharged in the hospital last.  Denies any sick contacts and had some mild abdominal pain but is exercising.  Also has pain and back pain and shaking and has spasms in his back pain is not changed from his prior visit.  He denies fevers at home but had more core temperature of 99.5 yesterday He was in bed with covers.  He has been nauseous.  A lot last time and has not really thrown up to 4-13 times and has used Zofran at home which has helped but he has not been able to eat anything.  Is resting admitted for nausea vomiting AKI on CKD stage IV from 8/9 to 06/08/2022.  He was presented with SIRS criteria and concern for recurrent osteomyelitis infection.  Imaging at that time showed no acute changes and ID neurosurgery consulted and blood cultures remain negative.  There were no concerns for recurrent osteomyelitis at that time and he does have hypertensive urgency and blood pressure medications were adjusted.  Currently he denies any fevers but has had chills and headaches as well as leg swelling.  He was seen by his nephrologist on Thursday and placed on a diuretic of torsemide.  In the ED he was given 500 mill bolus as well as placed on maintenance IV fluids at 75 mils per hour for 10 hours.  We will remove this continue to monitor his renal function carefully given that he continues have  intractable nausea vomiting.  CT scan of the abdomen pelvis was done and showed "There is no evidence of intestinal obstruction or pneumoperitoneum. There is no hydronephrosis.   There is liquid stool in the lumen of colon and rectum. This may be a normal variation or suggest  nonspecific enterocolitis. There is no significant wall thickening or pericolic stranding. Gallbladder stones. Small hiatal hernia. Small bilateral pleural effusions. There are small patchy infiltrates in both lower lung fields suggesting atelectasis/pneumonia.  cardiomegaly. Coronary artery calcifications are seen."   Patient is diarrhea is improving however he continues to be significantly nauseous and have some vomiting but this is improving slightly.  Renal function continues to improve a little bit but he appears extremely volume overloaded so nephrology been consulted for assistance.  Given that he continues to be intermittently nauseous and vomited all day yesterday we will consult GI for further evaluation.  His nausea vomiting and diarrhea improving slowly and GI recommends continuing supportive care for now.  They are recommending advancing diet as tolerated and obtain a GI pathogen panel and C. difficile if recurrence of diarrhea occurs.  Apparently the patient was still getting IV fluids even though they had been time-limited.  Nephrology is now given Lasix with albumin today and may be able to transition to p.o. diuretics tomorrow and they have started the patient on sodium bicarbonate   Assessment and Plan: * Intractable nausea and vomiting with associated diarrhea -46 year old with 2  day history intractable N/V/D in setting of progressing CKD -Place in observation telemetry -anti-emetics,slowly advance diet -check stool studies/quick c.diff. no hx of sick contacts. Had brief IV abx during last hospitalization a few weeks ago -Was recently started on a diuretic on Thursday however had intractable nausea vomiting and  this has been held given concern for dehydration and now being resumed by nephrology -Strict I/O's and he is +655 mL since admission -gentle IVF as below -Hold bethanechol -Continue with antiemetics with p.o./IV Ondansetron 4 mg may need Compazine -GI has been consulted and feel that he has gastroenteritis and recommends continuing supportive care given that his symptoms are improving and recommending obtaining GI pathogen panel and C. difficile if he has recurrence of diarrhea   Acute kidney injury superimposed on chronic kidney disease Stage IV (HCC) Metabolic Acidosis -Baseline creatinine was around 2-2.5;  -however, during recent hospitalization creatinine remained around 3.9.  -Nephrology was consulted and felt this would be closer to his new baseline.  Patient's BUNs/creatinine has now trended down to 42/2.74-Avoid nephrotoxic medications, contrast dyes, hypotension and dehydration to ensure adequate renal perfusion and will need to renally adjust medications -Renal ultrasound 8/12 significant for suggested medical renal disease with mild increased cortical echogenicity of the kidneys -Strict I/O's; he is +655 mL -Patient is a slight metabolic acidosis with a CO2 of 21, anion gap of 7, chloride level of 112 -Nephrology started the patient on sodium bicarbonate 650 mg p.o. 3 times daily -IV fluid hydration was ordered for 10 hours however it was continuously running for some reason and this is stopped by the nephrologist and now he will be getting IV Lasix with albumin -UA with ketones and protein, similar to last UA in setting of intractable N/V  -Continue to monitor and trend and repeat CMP in a.m.   Type 2 diabetes mellitus with chronic kidney disease, with long-term current use of insulin (HCC) -No anion gap, but he did have ketones in urine and has a slight metabolic acidosis.  -Recent A1C in 8/23 was 7.7 -On 70/30, substitute with semglee decreased dose of 8 units with decreased PO  intake  -Has not started his mounjaro -Continue with Sensitive NovoLog SSI and accuchecks AC -CBGs ranging from 93-156   Vertebral Osteomyelitis (HCC) -No change in back pain, no leukocytosis, normal lactic acid -MRI during 8/9 admit with no evidence of recurrent OM -Continue pain control  -Off antibiotics since April -Need close ID follow-up and may discuss with them but he appears to have no leukocytosis   Anemia of Chronic Kidney Disease -Likely had a dilutional drop as patient's hemoglobin/hematocrit went from 8.8/24.0 and trended down to 7.1/21.5 yesterday and today 7.2/21.5 -Check anemia panel in the a.m. -Continue to monitor for signs and symptoms of bleeding; no overt bleeding noted -Repeat CBC in a.m.   GAD (generalized anxiety disorder) -Continue Paroxetine 20 mg p.o. nightly, Mirtazapine 50 mg p.o. nightly and Hydroxyzine 25 mg p.o. 3 times daily as needed anxiety nausea   Essential Hypertension -Normotensive-mildly elevated, but improved from HTN urgency on prior admit and he has not taken any of his medication  -Continue home medication of Amlodipine 10mg  daily, Hydralazine 50mg  TID and Metoprolol 75mg  BID and will Titrate as needed  -Continue monitor blood pressures per protocol and last blood pressure reading was 130/76   Hyperlipidemia -Continue Atorvastatin 40 mg po Daily     Obesity -Complicates overall prognosis and care -Estimated body mass index is 30.99 kg/m as calculated from the following:  Height as of this encounter: 5\' 11"  (1.803 m).   Weight as of this encounter: 100.8 kg.  -Weight Loss and Dietary Counseling given  DVT prophylaxis: heparin injection 5,000 Units Start: 06/27/22 1445 Place TED hose Start: 06/27/22 1433    Code Status: Full Code Family Communication: No family currently at bedside  Disposition Plan:  Level of care: Med-Surg Status is: Inpatient Remains inpatient appropriate because: Continues to have some nausea vomiting and  will need further clearance by nephrology and GI prior to discharging   Consultants:  Gastroenterology Nephrology  Procedures:  As above  Antimicrobials:  Anti-infectives (From admission, onward)    None       Subjective: Seen and examined at bedside and he thinks he is doing a little bit better today.  States that he has had no more diarrhea and states he was nauseous and vomited all day yesterday but thinks his nausea little bit better today and able to keep down his food this morning.  Denies any chest pain but feels that her legs are tight and feels that is moved up to his abdomen and that his sides are "rocks".  He denies any other concerns or complaints at this time.  Objective: Vitals:   06/28/22 1947 06/29/22 0548 06/29/22 0834 06/29/22 1401  BP: 126/77 (!) 150/81 (!) 148/81 130/76  Pulse: 71 70 73 72  Resp: 16 17 18    Temp: 98.3 F (36.8 C)  97.8 F (36.6 C) 98.4 F (36.9 C)  TempSrc: Oral  Oral Oral  SpO2: 93% 91% 91% 96%  Weight:      Height:        Intake/Output Summary (Last 24 hours) at 06/29/2022 1853 Last data filed at 06/29/2022 0600 Gross per 24 hour  Intake 825 ml  Output --  Net 825 ml   Filed Weights   06/27/22 0834 06/27/22 1921  Weight: 105.2 kg 100.8 kg   Examination: Physical Exam:  Constitutional: WN/WD obese Caucasian male currently no acute distress appears a little lethargic and fatigued Respiratory: Diminished to auscultation bilaterally, no wheezing, rales, rhonchi or crackles. Normal respiratory effort and patient is not tachypenic. No accessory muscle use. Unlabored breathing  Cardiovascular: RRR, no murmurs / rubs / gallops. S1 and S2 auscultated. 2+ LE Edema Abdomen: Soft, non-tender, Distended 2/2 body habitus. Bowel sounds positive.  GU: Deferred. Musculoskeletal: No clubbing / cyanosis of digits/nails. No joint deformity upper and lower extremities.  Skin: No rashes, lesions, ulcers and has some tattoos. No induration; Warm and  dry.  Neurologic: CN 2-12 grossly intact with no focal deficits. Romberg sign and cerebellar reflexes not assessed.  Psychiatric: Normal judgment and insight. Alert and oriented x 3. Normal mood and appropriate affect.   Data Reviewed: I have personally reviewed following labs and imaging studies  CBC: Recent Labs  Lab 06/27/22 0824 06/27/22 0933 06/28/22 0051 06/29/22 0115  WBC 9.6  --  8.1 6.5  NEUTROABS 7.9*  --   --  3.2  HGB 8.8* 8.2* 7.1* 7.2*  HCT 26.7* 24.0* 21.5* 21.5*  MCV 90.8  --  90.7 90.3  PLT 222  --  205 811   Basic Metabolic Panel: Recent Labs  Lab 06/27/22 0824 06/27/22 0933 06/28/22 0051 06/29/22 0115  NA 142 142 141 140  K 4.7 4.7 4.6 4.4  CL 111  --  114* 112*  CO2 17*  --  18* 21*  GLUCOSE 136*  --  131* 118*  BUN 47*  --  47* 42*  CREATININE 4.01*  --  3.89* 3.74*  CALCIUM 9.0  --  8.0* 8.3*  MG  --   --   --  2.0  PHOS  --   --   --  4.1   GFR: Estimated Creatinine Clearance: 29.2 mL/min (A) (by C-G formula based on SCr of 3.74 mg/dL (H)). Liver Function Tests: Recent Labs  Lab 06/27/22 0824 06/29/22 0115  AST 17 10*  ALT 28 17  ALKPHOS 167* 126  BILITOT 0.9 0.4  PROT 6.4* 5.2*  ALBUMIN 3.4* 2.8*   Recent Labs  Lab 06/27/22 0824  LIPASE 32   No results for input(s): "AMMONIA" in the last 168 hours. Coagulation Profile: Recent Labs  Lab 06/27/22 0824  INR 1.1   Cardiac Enzymes: No results for input(s): "CKTOTAL", "CKMB", "CKMBINDEX", "TROPONINI" in the last 168 hours. BNP (last 3 results) No results for input(s): "PROBNP" in the last 8760 hours. HbA1C: No results for input(s): "HGBA1C" in the last 72 hours. CBG: Recent Labs  Lab 06/28/22 2348 06/29/22 0547 06/29/22 0836 06/29/22 1212 06/29/22 1614  GLUCAP 109* 93 111* 128* 161*   Lipid Profile: No results for input(s): "CHOL", "HDL", "LDLCALC", "TRIG", "CHOLHDL", "LDLDIRECT" in the last 72 hours. Thyroid Function Tests: No results for input(s): "TSH", "T4TOTAL",  "FREET4", "T3FREE", "THYROIDAB" in the last 72 hours. Anemia Panel: No results for input(s): "VITAMINB12", "FOLATE", "FERRITIN", "TIBC", "IRON", "RETICCTPCT" in the last 72 hours. Sepsis Labs: Recent Labs  Lab 06/27/22 0924 06/27/22 1921  LATICACIDVEN 0.7 0.9    Recent Results (from the past 240 hour(s))  SARS Coronavirus 2 by RT PCR (hospital order, performed in [facility] hospital lab) *cepheid single result test* Peripheral     Status: None   Collection Time: 06/27/22  9:03 AM   Specimen: Peripheral; Nasal Swab  Result Value Ref Range Status   SARS Coronavirus 2 by RT PCR NEGATIVE NEGATIVE Final    Comment: (NOTE) SARS-CoV-2 target nucleic acids are NOT DETECTED.  The SARS-CoV-2 RNA is generally detectable in upper and lower respiratory specimens during the acute phase of infection. The lowest concentration of SARS-CoV-2 viral copies this assay can detect is 250 copies / mL. A negative result does not preclude SARS-CoV-2 infection and should not be used as the sole basis for treatment or other patient management decisions.  A negative result may occur with improper specimen collection / handling, submission of specimen other than nasopharyngeal swab, presence of viral mutation(s) within the areas targeted by this assay, and inadequate number of viral copies (<250 copies / mL). A negative result must be combined with clinical observations, patient history, and epidemiological information.  Fact Sheet for Patients:   https://www.patel.info/  Fact Sheet for Healthcare Providers: https://hall.com/  This test is not yet approved or  cleared by the [place] FDA and has been authorized for detection and/or diagnosis of SARS-CoV-2 by FDA under an Emergency Use Authorization (EUA).  This EUA will remain in effect (meaning this test can be used) for the duration of the COVID-19 declaration under Section 564(b)(1) of the Act, 21  U.S.C. section 360bbb-3(b)(1), unless the authorization is terminated or revoked sooner.  Performed at [facility], [place] [address] [place], [place] [address]   Blood Culture (routine x 2)     Status: None (Preliminary result)   Collection Time: 06/27/22  9:08 AM   Specimen: BLOOD RIGHT WRIST  Result Value Ref Range Status   Specimen Description BLOOD RIGHT WRIST  Final   Special Requests  Final    BOTTLES DRAWN AEROBIC AND ANAEROBIC Blood Culture results may not be optimal due to an inadequate volume of blood received in culture bottles   Culture   Final    NO GROWTH 2 DAYS Performed at [facility], [place] [address] [place], [place] [address]    Report Status PENDING  Incomplete  Blood Culture (routine x 2)     Status: None (Preliminary result)   Collection Time: 06/27/22  9:18 AM   Specimen: BLOOD LEFT FOREARM  Result Value Ref Range Status   Specimen Description BLOOD LEFT FOREARM  Final   Special Requests   Final    BOTTLES DRAWN AEROBIC AND ANAEROBIC Blood Culture results may not be optimal due to an inadequate volume of blood received in culture bottles   Culture   Final    NO GROWTH 2 DAYS Performed at [facility], [place] [address] [place], [place] [address]    Report Status PENDING  Incomplete    Radiology Studies: No results found.  Scheduled Meds:  amLODipine  10 mg Oral Daily   atorvastatin  40 mg Oral Daily   gabapentin  200 mg Oral BID   heparin  5,000 Units Subcutaneous Q8H   insulin aspart  0-9 Units Subcutaneous TID WC   insulin glargine-yfgn  8 Units Subcutaneous QHS   metoprolol tartrate  75 mg Oral BID   mirtazapine  15 mg Oral QHS   PARoxetine  20 mg Oral QHS   sodium bicarbonate  650 mg Oral BID   tamsulosin  0.4 mg Oral QPC supper   Continuous Infusions:   LOS: 0 days   Raiford Noble, DO [facility] Available via Epic secure chat 7am-7pm After these hours, please refer to coverage provider listed on  [email or web address] 06/29/2022, 6:53 PM

## 2022-06-30 DIAGNOSIS — N179 Acute kidney failure, unspecified: Secondary | ICD-10-CM | POA: Diagnosis not present

## 2022-06-30 DIAGNOSIS — R112 Nausea with vomiting, unspecified: Secondary | ICD-10-CM | POA: Diagnosis not present

## 2022-06-30 DIAGNOSIS — D649 Anemia, unspecified: Secondary | ICD-10-CM | POA: Diagnosis not present

## 2022-06-30 DIAGNOSIS — I10 Essential (primary) hypertension: Secondary | ICD-10-CM | POA: Diagnosis not present

## 2022-06-30 LAB — CBC WITH DIFFERENTIAL/PLATELET
Abs Immature Granulocytes: 0.01 10*3/uL (ref 0.00–0.07)
Basophils Absolute: 0 10*3/uL (ref 0.0–0.1)
Basophils Relative: 1 %
Eosinophils Absolute: 0.3 10*3/uL (ref 0.0–0.5)
Eosinophils Relative: 5 %
HCT: 22 % — ABNORMAL LOW (ref 39.0–52.0)
Hemoglobin: 7.5 g/dL — ABNORMAL LOW (ref 13.0–17.0)
Immature Granulocytes: 0 %
Lymphocytes Relative: 35 %
Lymphs Abs: 1.9 10*3/uL (ref 0.7–4.0)
MCH: 30.9 pg (ref 26.0–34.0)
MCHC: 34.1 g/dL (ref 30.0–36.0)
MCV: 90.5 fL (ref 80.0–100.0)
Monocytes Absolute: 0.8 10*3/uL (ref 0.1–1.0)
Monocytes Relative: 13 %
Neutro Abs: 2.6 10*3/uL (ref 1.7–7.7)
Neutrophils Relative %: 46 %
Platelets: 178 10*3/uL (ref 150–400)
RBC: 2.43 MIL/uL — ABNORMAL LOW (ref 4.22–5.81)
RDW: 11.9 % (ref 11.5–15.5)
WBC: 5.6 10*3/uL (ref 4.0–10.5)
nRBC: 0 % (ref 0.0–0.2)

## 2022-06-30 LAB — COMPREHENSIVE METABOLIC PANEL
ALT: 17 U/L (ref 0–44)
AST: 12 U/L — ABNORMAL LOW (ref 15–41)
Albumin: 3 g/dL — ABNORMAL LOW (ref 3.5–5.0)
Alkaline Phosphatase: 127 U/L — ABNORMAL HIGH (ref 38–126)
Anion gap: 5 (ref 5–15)
BUN: 37 mg/dL — ABNORMAL HIGH (ref 6–20)
CO2: 23 mmol/L (ref 22–32)
Calcium: 8.6 mg/dL — ABNORMAL LOW (ref 8.9–10.3)
Chloride: 113 mmol/L — ABNORMAL HIGH (ref 98–111)
Creatinine, Ser: 3.66 mg/dL — ABNORMAL HIGH (ref 0.61–1.24)
GFR, Estimated: 20 mL/min — ABNORMAL LOW (ref >60)
Glucose, Bld: 140 mg/dL — ABNORMAL HIGH (ref 70–99)
Potassium: 4.8 mmol/L (ref 3.5–5.1)
Sodium: 141 mmol/L (ref 135–145)
Total Bilirubin: 0.5 mg/dL (ref 0.3–1.2)
Total Protein: 5.6 g/dL — ABNORMAL LOW (ref 6.5–8.1)

## 2022-06-30 LAB — GLUCOSE, CAPILLARY
Glucose-Capillary: 129 mg/dL — ABNORMAL HIGH (ref 70–99)
Glucose-Capillary: 110 mg/dL — ABNORMAL HIGH (ref 70–99)
Glucose-Capillary: 163 mg/dL — ABNORMAL HIGH (ref 70–99)
Glucose-Capillary: 89 mg/dL (ref 70–99)

## 2022-06-30 LAB — PHOSPHORUS: Phosphorus: 4.5 mg/dL (ref 2.5–4.6)

## 2022-06-30 LAB — MAGNESIUM: Magnesium: 1.9 mg/dL (ref 1.7–2.4)

## 2022-06-30 MED ORDER — FUROSEMIDE 10 MG/ML IJ SOLN
80.0000 mg | Freq: Every day | INTRAMUSCULAR | Status: DC
  Administered 2022-06-30 – 2022-07-03 (×4): 80 mg via INTRAVENOUS
  Filled 2022-06-30 (×4): qty 8

## 2022-06-30 MED ORDER — POLYSACCHARIDE IRON COMPLEX 150 MG PO CAPS
150.0000 mg | ORAL_CAPSULE | Freq: Every day | ORAL | Status: DC
  Administered 2022-06-30 – 2022-07-05 (×6): 150 mg via ORAL
  Filled 2022-06-30 (×6): qty 1

## 2022-06-30 MED ORDER — DARBEPOETIN ALFA 40 MCG/0.4ML IJ SOSY
40.0000 ug | PREFILLED_SYRINGE | INTRAMUSCULAR | Status: DC
  Administered 2022-06-30: 40 ug via SUBCUTANEOUS
  Filled 2022-06-30: qty 0.4

## 2022-06-30 NOTE — Progress Notes (Signed)
PROGRESS NOTE    Jose [name]  VQQ:[id number] DOB: 18-Aug-1976 DOA: 06/27/2022 PCP: Madelon Lips, MD   Brief Narrative:  The patient is a obese Caucasian male with a past medical history significant for but not limited to hypertension, hyperlipidemia, diabetes mellitus type 2, history of chronic kidney disease stage IV, history of thoracic osteomyelitis/discitis as well as other comorbidities who presented to the ED with complaints of intractable nausea and vomiting with associated loose bowel movements and diarrhea as well.  He states that he has noticed that he may continue to retain some fluid since he got discharged in the hospital last.  Denies any sick contacts and had some mild abdominal pain but is exercising.  Also has pain and back pain and shaking and has spasms in his back pain is not changed from his prior visit.  He denies fevers at home but had more core temperature of 99.5 yesterday He was in bed with covers.  He has been nauseous.  A lot last time and has not really thrown up to 4-13 times and has used Zofran at home which has helped but he has not been able to eat anything.  Is resting admitted for nausea vomiting AKI on CKD stage IV from 8/9 to 06/08/2022.  He was presented with SIRS criteria and concern for recurrent osteomyelitis infection.  Imaging at that time showed no acute changes and ID neurosurgery consulted and blood cultures remain negative.  There were no concerns for recurrent osteomyelitis at that time and he does have hypertensive urgency and blood pressure medications were adjusted.  Currently he denies any fevers but has had chills and headaches as well as leg swelling.  He was seen by his nephrologist on Thursday and placed on a diuretic of torsemide.  In the ED he was given 500 mill bolus as well as placed on maintenance IV fluids at 75 mils per hour for 10 hours.  We will remove this continue to monitor his renal function carefully given that he continues have  intractable nausea vomiting.  CT scan of the abdomen pelvis was done and showed "There is no evidence of intestinal obstruction or pneumoperitoneum. There is no hydronephrosis.   There is liquid stool in the lumen of colon and rectum. This may be a normal variation or suggest  nonspecific enterocolitis. There is no significant wall thickening or pericolic stranding. Gallbladder stones. Small hiatal hernia. Small bilateral pleural effusions. There are small patchy infiltrates in both lower lung fields suggesting atelectasis/pneumonia.  cardiomegaly. Coronary artery calcifications are seen."   Patient is diarrhea is improving however he continues to be significantly nauseous and have some vomiting but this is improving slightly.  Renal function continues to improve a little bit but he appears extremely volume overloaded so nephrology been consulted for assistance.  Given that he continues to be intermittently nauseous and vomited all day yesterday we will consult GI for further evaluation.  His nausea vomiting and diarrhea improving slowly and GI recommends continuing supportive care for now.  They are recommending advancing diet as tolerated and obtain a GI pathogen panel and C. difficile if recurrence of diarrhea occurs.  Apparently the patient was still getting IV fluids even though they had been time-limited.  Nephrology is now given Lasix with albumin today and may be able to transition to p.o. diuretics tomorrow and they have started the patient on sodium bicarbonate  Assessment and Plan: * Intractable nausea and vomiting with associated diarrhea, improving -46 year old with 2  day history intractable N/V/D in setting of progressing CKD -Place in observation telemetry -anti-emetics,slowly advance diet -check stool studies/quick c.diff. no hx of sick contacts. Had brief IV abx during last hospitalization a few weeks ago -Was recently started on a diuretic on Thursday however had intractable nausea  vomiting and this has been held given concern for dehydration and now being resumed by nephrology -Strict I/O's and see below -Hold bethanechol -Continue with antiemetics with p.o./IV Ondansetron 4 mg may need Compazine -GI has been consulted and feel that he has gastroenteritis and recommends continuing supportive care given that his symptoms are improving and recommending obtaining GI pathogen panel and C. difficile if he has recurrence of diarrhea; he had 1 episode of loose stool yesterday but his vomiting stopped and he continues to be slightly nauseous but improving slowly   Acute kidney injury superimposed on chronic kidney disease Stage IV (HCC) Metabolic Acidosis -Baseline creatinine was around 2-2.5;  -however, during recent hospitalization creatinine remained around 3.9.  -Nephrology was consulted and felt this would be closer to his new baseline.  Patient's BUNs/creatinine has now trended down to 42/3.74 and is now 37/3.66 -Avoid nephrotoxic medications, contrast dyes, hypotension and dehydration to ensure adequate renal perfusion and will need to renally adjust medications -Renal ultrasound 8/12 significant for suggested medical renal disease with mild increased cortical echogenicity of the kidneys -Strict I/O's; he is +1,735 mL -Patient had a slight metabolic acidosis with a CO2 of 21, anion gap of 7, chloride level of 112; now CO2 is 23, anion gap is 5, chloride level 113 -Nephrology started the patient on sodium bicarbonate 650 mg p.o. 3 times daily -IV fluid hydration was ordered for 10 hours however it was continuously running for some reason and this is stopped by the nephrologist and now he will be getting IV Lasix with albumin: Now he is on IV Lasix 80 mg daily -UA with ketones and protein, similar to last UA in setting of intractable N/V  -Continue to monitor and trend and repeat CMP in a.m.   Type 2 diabetes mellitus with chronic kidney disease, with long-term current use of  insulin (HCC) -No anion gap, but he did have ketones in urine and has a slight metabolic acidosis.  -Recent A1C in 8/23 was 7.7 -On 70/30, substitute with semglee decreased dose of 8 units with decreased PO intake  -Has not started his mounjaro -Continue with Sensitive NovoLog SSI and accuchecks AC -CBGs ranging from 110-163   Vertebral Osteomyelitis (HCC) -No change in back pain, no leukocytosis, normal lactic acid -MRI during 8/9 admit with no evidence of recurrent OM -Continue pain control  -Off antibiotics since April -Need close ID follow-up and may discuss with them but he appears to have no Leukocytosis   Anemia of Chronic Kidney Disease -Likely had a dilutional drop as patient's hemoglobin/hematocrit went from 8.8/24.0 -> 7.1/21.5 -> 7.2/21.5 -> 7.5/22.0 -Check Anemia Panel and it showed an iron level of 56, U IBC 190, TIBC 246, saturation greater than 23%, ferritin level 110 -Continue to monitor for signs and symptoms of bleeding; no overt bleeding noted -Repeat CBC in a.m.   GAD (generalized anxiety disorder) -Continue Paroxetine 20 mg p.o. nightly, Mirtazapine 50 mg p.o. nightly and Hydroxyzine 25 mg p.o. 3 times daily as needed anxiety nausea   Essential Hypertension -Normotensive-mildly elevated, but improved from HTN urgency on prior admit and he has not taken any of his medication  -Continue home medication of Amlodipine 10mg  daily, Hydralazine 50mg  TID and  Metoprolol 75mg  BID and will Titrate as needed  -Continue monitor blood pressures per protocol and last blood pressure reading was 144/76   Hyperlipidemia -Continue Atorvastatin 40 mg po Daily     Obesity -Complicates overall prognosis and care -Estimated body mass index is 31.9 kg/m as calculated from the following:   Height as of this encounter: 5\' 11"  (1.803 m).   Weight as of this encounter: 103.7 kg.  -Weight Loss and Dietary Counseling given  DVT prophylaxis: heparin injection 5,000 Units Start:  06/27/22 1445 Place TED hose Start: 06/27/22 1433    Code Status: Full Code Family Communication: No family currently at bedside  Disposition Plan:  Level of care: Med-Surg Status is: Inpatient Remains inpatient appropriate because: Slowly and his nausea vomiting is resolving.  Renal function is still elevated and he still remains volume overloaded so nephrology is diuresing daily.   Consultants:  Gastroenterology Nephrology  Procedures:  As above  Antimicrobials:  Anti-infectives (From admission, onward)    None       Subjective: Seen and examined at bedside and he is doing little bit better but still felt a little nauseous earlier.  States his diarrhea is improved significantly but he states that he had 1 loose bowel movement earlier.  Denies any chest pain or shortness of breath.  No other concerns or points at this time.  Objective: Vitals:   06/30/22 0547 06/30/22 0805 06/30/22 1413 06/30/22 1414  BP: (!) 151/80 (!) 157/84 (!) 144/76   Pulse: 77 76 76 76  Resp: 20 18 18    Temp: 98.1 F (36.7 C) 98.2 F (36.8 C) 97.9 F (36.6 C)   TempSrc: Oral Oral Oral   SpO2: 96% 94% (!) 89% 97%  Weight: 103.7 kg     Height:        Intake/Output Summary (Last 24 hours) at 06/30/2022 1635 Last data filed at 06/30/2022 1036 Gross per 24 hour  Intake 1080 ml  Output --  Net 1080 ml   Filed Weights   06/27/22 0834 06/27/22 1921 06/30/22 0547  Weight: 105.2 kg 100.8 kg 103.7 kg   Examination: Physical Exam:  Constitutional: WN/WD obese Caucasian male currently no acute distress and is a little sleepy but appears comfortable Respiratory: Diminished to auscultation bilaterally, no wheezing, rales, rhonchi or crackles. Normal respiratory effort and patient is not tachypenic. No accessory muscle use.  Unlabored breathing Cardiovascular: RRR, no murmurs / rubs / gallops. S1 and S2 auscultated.  Has 1 to 2+ lower extremity edema Abdomen: Soft, non-tender, distended secondary body  habitus. Bowel sounds positive.  GU: Deferred. Musculoskeletal: No clubbing / cyanosis of digits/nails.  Normal strength and muscle tone.  Skin: No rashes, lesions, ulcers and has some tattoos noted.  Skin is warm and dry Neurologic: CN 2-12 grossly intact with no focal deficits. Romberg sign and cerebellar reflexes not assessed.  Psychiatric: Normal judgment and insight. Alert and oriented x 3. Normal mood and appropriate affect.   Data Reviewed: I have personally reviewed following labs and imaging studies  CBC: Recent Labs  Lab 06/27/22 0824 06/27/22 0933 06/28/22 0051 06/29/22 0115 06/30/22 0148  WBC 9.6  --  8.1 6.5 5.6  NEUTROABS 7.9*  --   --  3.2 2.6  HGB 8.8* 8.2* 7.1* 7.2* 7.5*  HCT 26.7* 24.0* 21.5* 21.5* 22.0*  MCV 90.8  --  90.7 90.3 90.5  PLT 222  --  205 176 751   Basic Metabolic Panel: Recent Labs  Lab 06/27/22 0824 06/27/22 0933  06/28/22 0051 06/29/22 0115 06/30/22 0148  NA 142 142 141 140 141  K 4.7 4.7 4.6 4.4 4.8  CL 111  --  114* 112* 113*  CO2 17*  --  18* 21* 23  GLUCOSE 136*  --  131* 118* 140*  BUN 47*  --  47* 42* 37*  CREATININE 4.01*  --  3.89* 3.74* 3.66*  CALCIUM 9.0  --  8.0* 8.3* 8.6*  MG  --   --   --  2.0 1.9  PHOS  --   --   --  4.1 4.5   GFR: Estimated Creatinine Clearance: 30.3 mL/min (A) (by C-G formula based on SCr of 3.66 mg/dL (H)). Liver Function Tests: Recent Labs  Lab 06/27/22 0824 06/29/22 0115 06/30/22 0148  AST 17 10* 12*  ALT 28 17 17   ALKPHOS 167* 126 127*  BILITOT 0.9 0.4 0.5  PROT 6.4* 5.2* 5.6*  ALBUMIN 3.4* 2.8* 3.0*   Recent Labs  Lab 06/27/22 0824  LIPASE 32   No results for input(s): "AMMONIA" in the last 168 hours. Coagulation Profile: Recent Labs  Lab 06/27/22 0824  INR 1.1   Cardiac Enzymes: No results for input(s): "CKTOTAL", "CKMB", "CKMBINDEX", "TROPONINI" in the last 168 hours. BNP (last 3 results) No results for input(s): "PROBNP" in the last 8760 hours. HbA1C: No results for  input(s): "HGBA1C" in the last 72 hours. CBG: Recent Labs  Lab 06/29/22 1614 06/29/22 2004 06/30/22 0803 06/30/22 1118 06/30/22 1542  GLUCAP 161* 127* 129* 110* 163*   Lipid Profile: No results for input(s): "CHOL", "HDL", "LDLCALC", "TRIG", "CHOLHDL", "LDLDIRECT" in the last 72 hours. Thyroid Function Tests: No results for input(s): "TSH", "T4TOTAL", "FREET4", "T3FREE", "THYROIDAB" in the last 72 hours. Anemia Panel: Recent Labs    06/29/22 1828  FERRITIN 110  TIBC 246*  IRON 56   Sepsis Labs: Recent Labs  Lab 06/27/22 0924 06/27/22 1921  LATICACIDVEN 0.7 0.9    Recent Results (from the past 240 hour(s))  SARS Coronavirus 2 by RT PCR (hospital order, performed in [facility] hospital lab) *cepheid single result test* Peripheral     Status: None   Collection Time: 06/27/22  9:03 AM   Specimen: Peripheral; Nasal Swab  Result Value Ref Range Status   SARS Coronavirus 2 by RT PCR NEGATIVE NEGATIVE Final    Comment: (NOTE) SARS-CoV-2 target nucleic acids are NOT DETECTED.  The SARS-CoV-2 RNA is generally detectable in upper and lower respiratory specimens during the acute phase of infection. The lowest concentration of SARS-CoV-2 viral copies this assay can detect is 250 copies / mL. A negative result does not preclude SARS-CoV-2 infection and should not be used as the sole basis for treatment or other patient management decisions.  A negative result may occur with improper specimen collection / handling, submission of specimen other than nasopharyngeal swab, presence of viral mutation(s) within the areas targeted by this assay, and inadequate number of viral copies (<250 copies / mL). A negative result must be combined with clinical observations, patient history, and epidemiological information.  Fact Sheet for Patients:   https://www.patel.info/  Fact Sheet for Healthcare Providers: https://hall.com/  This test is not  yet approved or  cleared by the [place] FDA and has been authorized for detection and/or diagnosis of SARS-CoV-2 by FDA under an Emergency Use Authorization (EUA).  This EUA will remain in effect (meaning this test can be used) for the duration of the COVID-19 declaration under Section 564(b)(1) of the Act, 21 U.S.C.  section 360bbb-3(b)(1), unless the authorization is terminated or revoked sooner.  Performed at [facility], [place] [address] [place], [place] [address]   Blood Culture (routine x 2)     Status: None (Preliminary result)   Collection Time: 06/27/22  9:08 AM   Specimen: BLOOD RIGHT WRIST  Result Value Ref Range Status   Specimen Description BLOOD RIGHT WRIST  Final   Special Requests   Final    BOTTLES DRAWN AEROBIC AND ANAEROBIC Blood Culture results may not be optimal due to an inadequate volume of blood received in culture bottles   Culture   Final    NO GROWTH 3 DAYS Performed at [facility], [place] [address], [place], [place] [address]    Report Status PENDING  Incomplete  Blood Culture (routine x 2)     Status: None (Preliminary result)   Collection Time: 06/27/22  9:18 AM   Specimen: BLOOD LEFT FOREARM  Result Value Ref Range Status   Specimen Description BLOOD LEFT FOREARM  Final   Special Requests   Final    BOTTLES DRAWN AEROBIC AND ANAEROBIC Blood Culture results may not be optimal due to an inadequate volume of blood received in culture bottles   Culture   Final    NO GROWTH 3 DAYS Performed at [facility], [place] [address], [place], [place] [address]    Report Status PENDING  Incomplete    Radiology Studies: No results found.  Scheduled Meds:  amLODipine  10 mg Oral Daily   atorvastatin  40 mg Oral Daily   darbepoetin (ARANESP) injection - NON-DIALYSIS  40 mcg Subcutaneous Q Wed-1800   furosemide  80 mg Intravenous Daily   gabapentin  200 mg Oral BID   heparin  5,000 Units Subcutaneous Q8H   insulin aspart  0-9 Units  Subcutaneous TID WC   insulin glargine-yfgn  8 Units Subcutaneous QHS   iron polysaccharides  150 mg Oral Daily   metoprolol tartrate  75 mg Oral BID   mirtazapine  15 mg Oral QHS   PARoxetine  20 mg Oral QHS   sodium bicarbonate  650 mg Oral BID   tamsulosin  0.4 mg Oral QPC supper   Continuous Infusions:   LOS: 1 day   Raiford Noble, DO [facility] Available via Epic secure chat 7am-7pm After these hours, please refer to coverage provider listed on [email or web address] 06/30/2022, 4:35 PM

## 2022-06-30 NOTE — Progress Notes (Signed)
[facility] Progress Note  Name: GAD AYMOND MRN: [id number] DOB: [birth year]  Chief Complaint:  N/v and diarrhea  Subjective:  Strict ins/outs are not available despite order.  He had at least 4 unmeasured urine voids over 9/5.  He hasn't noticed much difference in swelling reduction but some.  We discussed risks/benefits/indications for ESA administration and he does consent to receive ESA  Review of systems:  Reports shortness of breath and states needed oxygen this am Denies chest pain  Nausea and has been on PRN's  Diarrhea better  ---------------- Background on referral:  ROWE WARMAN is a 46 y.o. male with a history of CKD, diabetes, hypertension, prior thoracic osteomyelitis who presented to the hospital with nausea with vomiting and diarrhea.  His creatinine was noted to be elevated at 4.01.  Creatinine history is below.  From mid August to present his creatinine has been 4.  Earlier this year creatinine between 2.1 and 2.48.  He has followed with Dr. Hollie Salk at our office.  He received fluids initially and these were stopped.  Suspect ins/outs not fully captured but had 650 ml UOP over 9/4 documented.  Nephrology is consulted for assistance with management.  He recently saw Dr. Hollie Salk on 8/31 for hospital follow-up.  On 06/24/22 his Cr was 3.61, BUN 42, eGFR 20, K 5.6, bicarb 17.  She wasn't sure how much recovery to really expect.  She had started a taper off minoxidil and had started torsemide 40 mg daily.  He states he really only took the torsemide for a day (because he later had n/v and wasn't able to keep it down).  He's not completely off the minodixil; he had been on it for a few weeks.  Diarrhea is a little better.  N/v is better but he states has gotten lots of PRN's. He does feel like he's having trouble getting a deep breath and tightness in his legs.  He was ordered fluids for 10 hours; these expired 13 hours or so ago and are still running.  New RN has just  picked up his care and is at bedside.  She turned the fluids off.  His mother and brother have had kidney disease; his brother's was after a heart attack.       Intake/Output Summary (Last 24 hours) at 06/30/2022 0923 Last data filed at 06/30/2022 0600 Gross per 24 hour  Intake 600 ml  Output --  Net 600 ml    Vitals:  Vitals:   06/29/22 1401 06/29/22 2007 06/30/22 0547 06/30/22 0805  BP: 130/76 138/77 (!) 151/80 (!) 157/84  Pulse: 72 73 77 76  Resp:  18 20 18   Temp: 98.4 F (36.9 C) 98.1 F (36.7 C) 98.1 F (36.7 C) 98.2 F (36.8 C)  TempSrc: Oral Oral Oral Oral  SpO2: 96% 92% 96% 94%  Weight:   103.7 kg   Height:         Physical Exam:  General: adult male in bed in NAD HEENT: NCAT Eyes: EOMI sclera anicteric Neck: supple trachea midline  Heart: S1S2 no rub Lungs: clear to auscultation; unlabored at rest on room air Abdomen: soft/nt/nd/obese habitus Extremities: 2+ edema; compression stockings on  Skin: no rash on extremities exposed Psych normal mood and affect Neuro:  alert and oriented x 3 provides hx and follows commands  Medications reviewed   Labs:     Latest Ref Rng & Units 06/30/2022    1:48 AM 06/29/2022    1:15 AM 06/28/2022  12:51 AM  BMP  Glucose 70 - 99 mg/dL 140  118  131   BUN 6 - 20 mg/dL 37  42  47   Creatinine 0.61 - 1.24 mg/dL 3.66  3.74  3.89   Sodium 135 - 145 mmol/L 141  140  141   Potassium 3.5 - 5.1 mmol/L 4.8  4.4  4.6   Chloride 98 - 111 mmol/L 113  112  114   CO2 22 - 32 mmol/L 23  21  18    Calcium 8.9 - 10.3 mg/dL 8.6  8.3  8.0      Assessment/Plan:   # CKD stage IV   - Likely 2/2 DM and HTN.  Setting of overload  - he got fluids earlier this admission    - Start Lasix 80 mg IV daily  - would limit gabapentin to no more than 300 mg over the course of a day given his CKD    # HTN  - controlled on current regimen   - lasix as above    # Metabolic acidosis - From Diarrhea and CKD - on sodium bicarbonate 650 mg BID for now    # Diarrhea  - covid was negative - work-up per primary team    # Normocytic anemia - Suspect contribution from his CKD.  Mild iron deficiency - defer IV iron in favor of PO - Start aranesp 40 mcg weekly    # DM type 2  - per primary team     # Chills  - shaking on my exam on 9/5   - recent osteo - work-up per primary team    Disposition - would continue inpatient monitoring    Claudia Desanctis, MD 06/30/2022 9:46 AM

## 2022-06-30 NOTE — Plan of Care (Signed)
A&Ox4, standby assist w/ walker ambulated to bathroom - o2 sats adequate on room air while awake, desat during sleep - 2L [place] o2 needed to maintain adequate o2 sats.     Problem: Education: Goal: Knowledge of General Education information will improve Description: Including pain rating scale, medication(s)/side effects and non-pharmacologic comfort measures Outcome: Progressing   Problem: Health Behavior/Discharge Planning: Goal: Ability to manage health-related needs will improve Outcome: Progressing   Problem: Clinical Measurements: Goal: Ability to maintain clinical measurements within normal limits will improve Outcome: Progressing Goal: Will remain free from infection Outcome: Progressing Goal: Diagnostic test results will improve Outcome: Progressing Goal: Respiratory complications will improve Outcome: Progressing Goal: Cardiovascular complication will be avoided Outcome: Progressing   Problem: Activity: Goal: Risk for activity intolerance will decrease Outcome: Progressing   Problem: Nutrition: Goal: Adequate nutrition will be maintained Outcome: Progressing   Problem: Coping: Goal: Level of anxiety will decrease Outcome: Progressing   Problem: Elimination: Goal: Will not experience complications related to bowel motility Outcome: Progressing Goal: Will not experience complications related to urinary retention Outcome: Progressing   Problem: Pain Managment: Goal: General experience of comfort will improve Outcome: Progressing   Problem: Safety: Goal: Ability to remain free from injury will improve Outcome: Progressing   Problem: Skin Integrity: Goal: Risk for impaired skin integrity will decrease Outcome: Progressing   Problem: Education: Goal: Ability to describe self-care measures that may prevent or decrease complications (Diabetes Survival Skills Education) will improve Outcome: Progressing Goal: Individualized Educational Video(s) Outcome:  Progressing   Problem: Coping: Goal: Ability to adjust to condition or change in health will improve Outcome: Progressing   Problem: Fluid Volume: Goal: Ability to maintain a balanced intake and output will improve Outcome: Progressing   Problem: Health Behavior/Discharge Planning: Goal: Ability to identify and utilize available resources and services will improve Outcome: Progressing Goal: Ability to manage health-related needs will improve Outcome: Progressing   Problem: Metabolic: Goal: Ability to maintain appropriate glucose levels will improve Outcome: Progressing   Problem: Nutritional: Goal: Maintenance of adequate nutrition will improve Outcome: Progressing Goal: Progress toward achieving an optimal weight will improve Outcome: Progressing   Problem: Skin Integrity: Goal: Risk for impaired skin integrity will decrease Outcome: Progressing   Problem: Tissue Perfusion: Goal: Adequacy of tissue perfusion will improve Outcome: Progressing

## 2022-06-30 NOTE — Progress Notes (Signed)
[facility] Progress Note  Jose [name] 46 y.o. 03-Jun-1976  CC: Nausea vomiting and diarrhea   Subjective: Patient seen and examined at bedside.  Feeling better.  Denies any further nausea or vomiting.  1 episode of loose stools yesterday.  No bowel movement today.  Overall feeling better.  ROS : Afebrile, negative for chest pain   Objective: Vital signs in last 24 hours: Vitals:   06/30/22 0547 06/30/22 0805  BP: (!) 151/80 (!) 157/84  Pulse: 77 76  Resp: 20 18  Temp: 98.1 F (36.7 C) 98.2 F (36.8 C)  SpO2: 96% 94%    Physical Exam:  General:  Alert, cooperative, no distress, appears stated age  Head:  Normocephalic, without obvious abnormality, atraumatic  Eyes:  , EOM's intact,   Lungs:   Clear to auscultation bilaterally, respirations unlabored  Heart:  Regular rate and rhythm, S1, S2 normal  Abdomen:   Soft, non-tender, bowel sounds active all four quadrants,  no masses,   Extremities: Extremities normal, atraumatic, no  edema  Pulses: 2+ and symmetric    Lab Results: Recent Labs    06/29/22 0115 06/30/22 0148  NA 140 141  K 4.4 4.8  CL 112* 113*  CO2 21* 23  GLUCOSE 118* 140*  BUN 42* 37*  CREATININE 3.74* 3.66*  CALCIUM 8.3* 8.6*  MG 2.0 1.9  PHOS 4.1 4.5   Recent Labs    06/29/22 0115 06/30/22 0148  AST 10* 12*  ALT 17 17  ALKPHOS 126 127*  BILITOT 0.4 0.5  PROT 5.2* 5.6*  ALBUMIN 2.8* 3.0*   Recent Labs    06/29/22 0115 06/30/22 0148  WBC 6.5 5.6  NEUTROABS 3.2 2.6  HGB 7.2* 7.5*  HCT 21.5* 22.0*  MCV 90.3 90.5  PLT 176 178   No results for input(s): "LABPROT", "INR" in the last 72 hours.    Assessment/Plan: -Nausea vomiting and diarrhea.  Most likely gastroenteritis.  Symptoms improving.   -Stage IV chronic kidney disease -Chronic anemia -History of esophagitis.   Recommendations ------------------------- -Patient's symptoms are improving.  Recommend supportive care for now. -Advance diet as tolerated.   No further inpatient GI work-up planned.  Follow-up with GI clinic in 2 to 3 months.  Patient is due for screening colonoscopy. -Discussed with hospitalist.   Otis Brace MD, [place] 06/30/2022, 11:18 AM  Contact #  [phone]

## 2022-06-30 NOTE — Progress Notes (Addendum)
Mobility Specialist - Progress Note   06/30/22 1646  Mobility  Activity Refused mobility      Pt in bed w/ RN in room administering pain medication. Explained that he begins to feel nauseous shortly after taking this medication, declined mobility. Stated he has been getting out of bed on his own to use the restroom and wash himself. Said he will do some bed exercises on his own this evening. Asked me to return tomorrow. Will follow up as time allows. Left in bed w/ call bell within reach and all needs met.   Paulla Dolly Mobility Specialist

## 2022-06-30 NOTE — Plan of Care (Signed)

## 2022-07-01 DIAGNOSIS — D649 Anemia, unspecified: Secondary | ICD-10-CM | POA: Diagnosis not present

## 2022-07-01 DIAGNOSIS — R112 Nausea with vomiting, unspecified: Secondary | ICD-10-CM | POA: Diagnosis not present

## 2022-07-01 DIAGNOSIS — N179 Acute kidney failure, unspecified: Secondary | ICD-10-CM | POA: Diagnosis not present

## 2022-07-01 DIAGNOSIS — I10 Essential (primary) hypertension: Secondary | ICD-10-CM | POA: Diagnosis not present

## 2022-07-01 LAB — CBC WITH DIFFERENTIAL/PLATELET
Abs Immature Granulocytes: 0.02 10*3/uL (ref 0.00–0.07)
Basophils Absolute: 0 10*3/uL (ref 0.0–0.1)
Basophils Relative: 1 %
Eosinophils Absolute: 0.3 10*3/uL (ref 0.0–0.5)
Eosinophils Relative: 5 %
HCT: 23.7 % — ABNORMAL LOW (ref 39.0–52.0)
Hemoglobin: 8.1 g/dL — ABNORMAL LOW (ref 13.0–17.0)
Immature Granulocytes: 0 %
Lymphocytes Relative: 25 %
Lymphs Abs: 1.5 10*3/uL (ref 0.7–4.0)
MCH: 30.6 pg (ref 26.0–34.0)
MCHC: 34.2 g/dL (ref 30.0–36.0)
MCV: 89.4 fL (ref 80.0–100.0)
Monocytes Absolute: 0.8 10*3/uL (ref 0.1–1.0)
Monocytes Relative: 13 %
Neutro Abs: 3.3 10*3/uL (ref 1.7–7.7)
Neutrophils Relative %: 56 %
Platelets: 177 10*3/uL (ref 150–400)
RBC: 2.65 MIL/uL — ABNORMAL LOW (ref 4.22–5.81)
RDW: 11.9 % (ref 11.5–15.5)
WBC: 5.9 10*3/uL (ref 4.0–10.5)
nRBC: 0 % (ref 0.0–0.2)

## 2022-07-01 LAB — COMPREHENSIVE METABOLIC PANEL
ALT: 14 U/L (ref 0–44)
AST: 14 U/L — ABNORMAL LOW (ref 15–41)
Albumin: 3.2 g/dL — ABNORMAL LOW (ref 3.5–5.0)
Alkaline Phosphatase: 124 U/L (ref 38–126)
Anion gap: 9 (ref 5–15)
BUN: 36 mg/dL — ABNORMAL HIGH (ref 6–20)
CO2: 21 mmol/L — ABNORMAL LOW (ref 22–32)
Calcium: 8.7 mg/dL — ABNORMAL LOW (ref 8.9–10.3)
Chloride: 110 mmol/L (ref 98–111)
Creatinine, Ser: 3.72 mg/dL — ABNORMAL HIGH (ref 0.61–1.24)
GFR, Estimated: 19 mL/min — ABNORMAL LOW (ref >60)
Glucose, Bld: 126 mg/dL — ABNORMAL HIGH (ref 70–99)
Potassium: 4.9 mmol/L (ref 3.5–5.1)
Sodium: 140 mmol/L (ref 135–145)
Total Bilirubin: 0.5 mg/dL (ref 0.3–1.2)
Total Protein: 6 g/dL — ABNORMAL LOW (ref 6.5–8.1)

## 2022-07-01 LAB — PHOSPHORUS: Phosphorus: 4.7 mg/dL — ABNORMAL HIGH (ref 2.5–4.6)

## 2022-07-01 LAB — RAPID URINE DRUG SCREEN, HOSP PERFORMED
Amphetamines: NOT DETECTED
Barbiturates: NOT DETECTED
Benzodiazepines: NOT DETECTED
Cocaine: NOT DETECTED
Opiates: NOT DETECTED
Tetrahydrocannabinol: NOT DETECTED

## 2022-07-01 LAB — MAGNESIUM: Magnesium: 1.9 mg/dL (ref 1.7–2.4)

## 2022-07-01 LAB — GLUCOSE, CAPILLARY
Glucose-Capillary: 124 mg/dL — ABNORMAL HIGH (ref 70–99)
Glucose-Capillary: 80 mg/dL (ref 70–99)
Glucose-Capillary: 112 mg/dL — ABNORMAL HIGH (ref 70–99)
Glucose-Capillary: 166 mg/dL — ABNORMAL HIGH (ref 70–99)

## 2022-07-01 MED ORDER — FUROSEMIDE 10 MG/ML IJ SOLN
80.0000 mg | Freq: Once | INTRAMUSCULAR | Status: AC
  Administered 2022-07-01: 80 mg via INTRAVENOUS
  Filled 2022-07-01: qty 8

## 2022-07-01 MED ORDER — SUCRALFATE 1 G PO TABS
1.0000 g | ORAL_TABLET | Freq: Two times a day (BID) | ORAL | Status: DC
  Administered 2022-07-01 – 2022-07-02 (×3): 1 g via ORAL
  Filled 2022-07-01 (×3): qty 1

## 2022-07-01 MED ORDER — PANTOPRAZOLE SODIUM 40 MG PO TBEC
40.0000 mg | DELAYED_RELEASE_TABLET | Freq: Every day | ORAL | Status: DC
  Administered 2022-07-01 – 2022-07-05 (×5): 40 mg via ORAL
  Filled 2022-07-01 (×5): qty 1

## 2022-07-01 NOTE — Progress Notes (Signed)
Mobility Specialist Progress Note   07/01/22 1416  Mobility  Activity Ambulated with assistance in hallway  Level of Assistance Contact guard assist, steadying assist  Assistive Device Front wheel walker  Distance Ambulated (ft) 180 ft  Activity Response Tolerated well  $Mobility charge 1 Mobility   Received pt in bed c/o of R sided pain but agreeable to mobility. Pt requiring min G for safety and CG for stiff gait during ambulation. x1 seated rest break d/t fatigue, returned back to room w/o fault but requiring cues for safe RW mechanics. Left on EOB w/ call bell in reach and needs met.   Holland Falling Mobility Specialist MS [facility] #:  [phone] Acute Rehab Office:  [phone]

## 2022-07-01 NOTE — Progress Notes (Signed)
PROGRESS NOTE    Jose [name]  KGY:[id number] DOB: 1976/03/30 DOA: 06/27/2022 PCP: Madelon Lips, MD   Brief Narrative:  The patient is a obese Caucasian male with a past medical history significant for but not limited to hypertension, hyperlipidemia, diabetes mellitus type 2, history of chronic kidney disease stage IV, history of thoracic osteomyelitis/discitis as well as other comorbidities who presented to the ED with complaints of intractable nausea and vomiting with associated loose bowel movements and diarrhea as well.  He states that he has noticed that he may continue to retain some fluid since he got discharged in the hospital last.  Denies any sick contacts and had some mild abdominal pain but is exercising.  Also has pain and back pain and shaking and has spasms in his back pain is not changed from his prior visit.  He denies fevers at home but had more core temperature of 99.5 yesterday He was in bed with covers.  He has been nauseous.  A lot last time and has not really thrown up to 4-13 times and has used Zofran at home which has helped but he has not been able to eat anything.  Is resting admitted for nausea vomiting AKI on CKD stage IV from 8/9 to 06/08/2022.  He was presented with SIRS criteria and concern for recurrent osteomyelitis infection.  Imaging at that time showed no acute changes and ID neurosurgery consulted and blood cultures remain negative.  There were no concerns for recurrent osteomyelitis at that time and he does have hypertensive urgency and blood pressure medications were adjusted.  Currently he denies any fevers but has had chills and headaches as well as leg swelling.  He was seen by his nephrologist on Thursday and placed on a diuretic of torsemide.  In the ED he was given 500 mill bolus as well as placed on maintenance IV fluids at 75 mils per hour for 10 hours.  We will remove this continue to monitor his renal function carefully given that he continues have  intractable nausea vomiting.  CT scan of the abdomen pelvis was done and showed "There is no evidence of intestinal obstruction or pneumoperitoneum. There is no hydronephrosis.   There is liquid stool in the lumen of colon and rectum. This may be a normal variation or suggest  nonspecific enterocolitis. There is no significant wall thickening or pericolic stranding. Gallbladder stones. Small hiatal hernia. Small bilateral pleural effusions. There are small patchy infiltrates in both lower lung fields suggesting atelectasis/pneumonia.  cardiomegaly. Coronary artery calcifications are seen."   Patient is diarrhea is improving however he continues to be significantly nauseous and have some vomiting but this is improving slightly.  Renal function continues to improve a little bit but he appears extremely volume overloaded so nephrology been consulted for assistance.  Given that he continues to be intermittently nauseous and vomited all day yesterday we will consult GI for further evaluation.  His nausea vomiting and diarrhea improving slowly and GI recommends continuing supportive care for now.  They are recommending advancing diet as tolerated and obtain a GI pathogen panel and C. difficile if recurrence of diarrhea occurs.  Apparently the patient was still getting IV fluids even though they had been time-limited.  For which is now evaluating and diuresing the patient daily and have given him a dose of extra Lasix this afternoon.  He continues to improve slowly.  Assessment and Plan: * Intractable nausea and vomiting with associated diarrhea, improving -46 year old with 2  day history intractable N/V/D in setting of progressing CKD -Place in observation telemetry -anti-emetics,slowly advance diet -check stool studies/quick c.diff. no hx of sick contacts. Had brief IV abx during last hospitalization a few weeks ago -Was recently started on a diuretic on Thursday however had intractable nausea vomiting and this  has been held given concern for dehydration and now being resumed by nephrology -Strict I/O's and see below -Hold bethanechol -Continue with antiemetics with p.o./IV Ondansetron 4 mg may need Compazine; his nausea vomiting is improving and continues to have some mild nausea but this is improved -GI has been consulted and feel that he has gastroenteritis and recommends continuing supportive care given that his symptoms are improving and recommending obtaining GI pathogen panel and C. difficile if he has recurrence of diarrhea; he had 1 episode of loose stool the day before yesterday but his vomiting stopped and he continues to be slightly nauseous but improving slowly.  GI is now recommending starting the patient on a PPI and starting pantoprazole 40 mg daily for history of esophagitis and adding Carafate 1 g twice daily for 7 days and avoiding long-term use of Carafate given his chronic kidney disease   Acute kidney injury superimposed on chronic kidney disease Stage IV (HCC) Metabolic Acidosis -Baseline creatinine was around 2-2.5;  -however, during recent hospitalization creatinine remained around 3.9.  -Nephrology was consulted and felt this would be closer to his new baseline.  Patient's BUNs/creatinine has now trended down to 42/3.74 -> 37/3.66 -> 36/3.72 -Avoid nephrotoxic medications, contrast dyes, hypotension and dehydration to ensure adequate renal perfusion and will need to renally adjust medications -Renal ultrasound 8/12 significant for suggested medical renal disease with mild increased cortical echogenicity of the kidneys -Strict I/O's; he is + 205 mL -He continues to have a slight metabolic acidosis with a CO2 of 21, anion gap of 9, chloride level of 110 -Nephrology started the patient on sodium bicarbonate 650 mg p.o. 3 times daily -IV fluid hydration was ordered for 10 hours however it was continuously running for some reason and this is stopped by the nephrologist and now he will  be getting IV Lasix with albumin: Now he is on IV Lasix 80 mg daily and this is being continued but nephrology will be given him another IV 80 mg Lasix dose this afternoon -UA with ketones and protein, similar to last UA in setting of intractable N/V  -Continue to monitor and trend and repeat CMP in a.m he remains volume overloaded   Type 2 diabetes mellitus with chronic kidney disease, with long-term current use of insulin (HCC) -No anion gap, but he did have ketones in urine and has a slight metabolic acidosis.  -Recent A1C in 8/23 was 7.7 -On 70/30, substitute with semglee decreased dose of 8 units with decreased PO intake  -Has not started his mounjaro -Continue with Sensitive NovoLog SSI and accuchecks AC -CBGs ranging from 80-163   Vertebral Osteomyelitis ([place]) -No change in back pain, no leukocytosis, normal lactic acid -MRI during 8/9 admit with no evidence of recurrent OM -Continue pain control as he continues to have some spasms -Off antibiotics since April -Need close ID follow-up and may discuss with them but he appears to have no Leukocytosis but he continues to have the spasms and may need reimaging   Anemia of Chronic Kidney Disease -Likely had a dilutional drop as patient's hemoglobin/hematocrit went from 8.8/24.0 -> 7.1/21.5 -> 7.2/21.5 -> 7.5/22.0 and is now 8.1/23.7 -Check Anemia Panel and it showed an  iron level of 56, U IBC 190, TIBC 246, saturation greater than 23%, ferritin level 110 -Received a dose of Aranesp -Continue to monitor for signs and symptoms of bleeding; no overt bleeding noted -Repeat CBC in a.m.   GAD (generalized anxiety disorder) -Continue Paroxetine 20 mg p.o. nightly, Mirtazapine 50 mg p.o. nightly and Hydroxyzine 25 mg p.o. 3 times daily as needed anxiety nausea   Essential Hypertension -Normotensive-mildly elevated, but improved from HTN urgency on prior admit and he has not taken any of his medication  -Continue home medication of Amlodipine  10mg  daily, Hydralazine 50mg  TID and Metoprolol 75mg  BID and will Titrate as needed  -Continue monitor blood pressures per protocol and last blood pressure reading was 144/76   Hyperlipidemia -Continue Atorvastatin 40 mg po Daily     Obesity -Complicates overall prognosis and care -Estimated body mass index is 31.9 kg/m as calculated from the following:   Height as of this encounter: 5\' 11"  (1.803 m).   Weight as of this encounter: 103.7 kg.  -Weight Loss and Dietary Counseling given  DVT prophylaxis: heparin injection 5,000 Units Start: 06/27/22 1445 Place TED hose Start: 06/27/22 1433    Code Status: Full Code Family Communication: No family currently at bedside  Disposition Plan:  Level of care: Med-Surg Status is: Inpatient Remains inpatient appropriate because: Continues to be volume overloaded and needs further diuresis back to dry weight and clearance by nephrology and GI   Consultants:  Gastroenterology Nephrology  Procedures:  As delineated as above  Antimicrobials:  Anti-infectives (From admission, onward)    None       Subjective: Seen and examined at bedside and he thinks he is doing little bit better.  He is not as nauseous and has not vomited but still remains a little bit nauseous.  Still feels volume overloaded.  Denies any chest pain or shortness of breath.  Continues to have some back spasms but has had none today.  Denies any other concerns or complaints at this time.  Objective: Vitals:   07/01/22 0627 07/01/22 0628 07/01/22 0849 07/01/22 1438  BP: (!) 158/83 (!) 158/83 (!) 160/84 139/74  Pulse: 70 70 74 73  Resp: 16 16 18 18   Temp: 98.5 F (36.9 C) 98.5 F (36.9 C) 98.2 F (36.8 C) 98.6 F (37 C)  TempSrc: Oral Oral Oral Oral  SpO2: 95% 95% 98% 94%  Weight:      Height:        Intake/Output Summary (Last 24 hours) at 07/01/2022 1458 Last data filed at 07/01/2022 1300 Gross per 24 hour  Intake 1220 ml  Output 2500 ml  Net -1280 ml    Filed Weights   06/27/22 0834 06/27/22 1921 06/30/22 0547  Weight: 105.2 kg 100.8 kg 103.7 kg   Examination: Physical Exam:  Constitutional: WN/WD, obese Caucasian male currently no acute distress appears calm and alert but more improved and a little bit more comfortable Respiratory: Diminished to auscultation bilaterally with coarse breath sounds, no wheezing, rales, rhonchi or crackles. Normal respiratory effort and patient is not tachypenic. No accessory muscle use.  Unlabored breathing  Cardiovascular: RRR, no murmurs / rubs / gallops. S1 and S2 auscultated.  1-2+ lower extremity edema Abdomen: Soft, mildly-tender, distended secondary body habitus. Bowel sounds positive.  GU: Deferred. Musculoskeletal: No clubbing / cyanosis of digits/nails. No joint deformity upper and lower extremities.   Skin: No rashes, lesions, ulcers on limited skin evaluation. No induration; Warm and dry.  Neurologic: CN 2-12 grossly intact  with no focal deficits. Romberg sign and cerebellar reflexes not assessed.  Psychiatric: Normal judgment and insight. Alert and oriented x 3. Normal mood and appropriate affect.   Data Reviewed: I have personally reviewed following labs and imaging studies  CBC: Recent Labs  Lab 06/27/22 0824 06/27/22 0933 06/28/22 0051 06/29/22 0115 06/30/22 0148 07/01/22 0902  WBC 9.6  --  8.1 6.5 5.6 5.9  NEUTROABS 7.9*  --   --  3.2 2.6 3.3  HGB 8.8* 8.2* 7.1* 7.2* 7.5* 8.1*  HCT 26.7* 24.0* 21.5* 21.5* 22.0* 23.7*  MCV 90.8  --  90.7 90.3 90.5 89.4  PLT 222  --  205 176 178 856   Basic Metabolic Panel: Recent Labs  Lab 06/27/22 0824 06/27/22 0933 06/28/22 0051 06/29/22 0115 06/30/22 0148 07/01/22 0902  NA 142 142 141 140 141 140  K 4.7 4.7 4.6 4.4 4.8 4.9  CL 111  --  114* 112* 113* 110  CO2 17*  --  18* 21* 23 21*  GLUCOSE 136*  --  131* 118* 140* 126*  BUN 47*  --  47* 42* 37* 36*  CREATININE 4.01*  --  3.89* 3.74* 3.66* 3.72*  CALCIUM 9.0  --  8.0* 8.3*  8.6* 8.7*  MG  --   --   --  2.0 1.9 1.9  PHOS  --   --   --  4.1 4.5 4.7*   GFR: Estimated Creatinine Clearance: 29.8 mL/min (A) (by C-G formula based on SCr of 3.72 mg/dL (H)). Liver Function Tests: Recent Labs  Lab 06/27/22 0824 06/29/22 0115 06/30/22 0148 07/01/22 0902  AST 17 10* 12* 14*  ALT 28 17 17 14   ALKPHOS 167* 126 127* 124  BILITOT 0.9 0.4 0.5 0.5  PROT 6.4* 5.2* 5.6* 6.0*  ALBUMIN 3.4* 2.8* 3.0* 3.2*   Recent Labs  Lab 06/27/22 0824  LIPASE 32   No results for input(s): "AMMONIA" in the last 168 hours. Coagulation Profile: Recent Labs  Lab 06/27/22 0824  INR 1.1   Cardiac Enzymes: No results for input(s): "CKTOTAL", "CKMB", "CKMBINDEX", "TROPONINI" in the last 168 hours. BNP (last 3 results) No results for input(s): "PROBNP" in the last 8760 hours. HbA1C: No results for input(s): "HGBA1C" in the last 72 hours. CBG: Recent Labs  Lab 06/30/22 1118 06/30/22 1542 06/30/22 1958 07/01/22 0847 07/01/22 1146  GLUCAP 110* 163* 89 124* 80   Lipid Profile: No results for input(s): "CHOL", "HDL", "LDLCALC", "TRIG", "CHOLHDL", "LDLDIRECT" in the last 72 hours. Thyroid Function Tests: No results for input(s): "TSH", "T4TOTAL", "FREET4", "T3FREE", "THYROIDAB" in the last 72 hours. Anemia Panel: Recent Labs    06/29/22 1828  FERRITIN 110  TIBC 246*  IRON 56   Sepsis Labs: Recent Labs  Lab 06/27/22 0924 06/27/22 1921  LATICACIDVEN 0.7 0.9   Recent Results (from the past 240 hour(s))  SARS Coronavirus 2 by RT PCR (hospital order, performed in [facility] hospital lab) *cepheid single result test* Peripheral     Status: None   Collection Time: 06/27/22  9:03 AM   Specimen: Peripheral; Nasal Swab  Result Value Ref Range Status   SARS Coronavirus 2 by RT PCR NEGATIVE NEGATIVE Final    Comment: (NOTE) SARS-CoV-2 target nucleic acids are NOT DETECTED.  The SARS-CoV-2 RNA is generally detectable in upper and lower respiratory specimens during the  acute phase of infection. The lowest concentration of SARS-CoV-2 viral copies this assay can detect is 250 copies / mL. A negative result does not preclude  SARS-CoV-2 infection and should not be used as the sole basis for treatment or other patient management decisions.  A negative result may occur with improper specimen collection / handling, submission of specimen other than nasopharyngeal swab, presence of viral mutation(s) within the areas targeted by this assay, and inadequate number of viral copies (<250 copies / mL). A negative result must be combined with clinical observations, patient history, and epidemiological information.  Fact Sheet for Patients:   https://www.patel.info/  Fact Sheet for Healthcare Providers: https://hall.com/  This test is not yet approved or  cleared by the [place] FDA and has been authorized for detection and/or diagnosis of SARS-CoV-2 by FDA under an Emergency Use Authorization (EUA).  This EUA will remain in effect (meaning this test can be used) for the duration of the COVID-19 declaration under Section 564(b)(1) of the Act, 21 U.S.C. section 360bbb-3(b)(1), unless the authorization is terminated or revoked sooner.  Performed at [facility], [place] [address] [place], [place] [address]   Blood Culture (routine x 2)     Status: None (Preliminary result)   Collection Time: 06/27/22  9:08 AM   Specimen: BLOOD RIGHT WRIST  Result Value Ref Range Status   Specimen Description BLOOD RIGHT WRIST  Final   Special Requests   Final    BOTTLES DRAWN AEROBIC AND ANAEROBIC Blood Culture results may not be optimal due to an inadequate volume of blood received in culture bottles   Culture   Final    NO GROWTH 4 DAYS Performed at [facility], [place] [address], [place], [place] [address]    Report Status PENDING  Incomplete  Blood Culture (routine x 2)     Status: None (Preliminary result)    Collection Time: 06/27/22  9:18 AM   Specimen: BLOOD LEFT FOREARM  Result Value Ref Range Status   Specimen Description BLOOD LEFT FOREARM  Final   Special Requests   Final    BOTTLES DRAWN AEROBIC AND ANAEROBIC Blood Culture results may not be optimal due to an inadequate volume of blood received in culture bottles   Culture   Final    NO GROWTH 4 DAYS Performed at [facility], [place] [address], [place], [place] [address]    Report Status PENDING  Incomplete    Radiology Studies: No results found.  Scheduled Meds:  amLODipine  10 mg Oral Daily   atorvastatin  40 mg Oral Daily   darbepoetin (ARANESP) injection - NON-DIALYSIS  40 mcg Subcutaneous Q Wed-1800   furosemide  80 mg Intravenous Daily   furosemide  80 mg Intravenous Once   gabapentin  200 mg Oral BID   heparin  5,000 Units Subcutaneous Q8H   insulin aspart  0-9 Units Subcutaneous TID WC   insulin glargine-yfgn  8 Units Subcutaneous QHS   iron polysaccharides  150 mg Oral Daily   metoprolol tartrate  75 mg Oral BID   mirtazapine  15 mg Oral QHS   pantoprazole  40 mg Oral Daily   PARoxetine  20 mg Oral QHS   sodium bicarbonate  650 mg Oral BID   sucralfate  1 g Oral BID   tamsulosin  0.4 mg Oral QPC supper   Continuous Infusions:   LOS: 2 days   Raiford Noble, DO [facility] Available via Epic secure chat 7am-7pm After these hours, please refer to coverage provider listed on [email or web address] 07/01/2022, 2:58 PM

## 2022-07-01 NOTE — Progress Notes (Signed)
[facility] Progress Note  Name: Jose [name] MRN: [id number] DOB: 12/11/1975  Chief Complaint:  N/v and diarrhea  Subjective:  He had 2.5 liters UOP over 9/6 as well as two unmeasured urine voids.  Still feels bloated "like I have a spare tire around me".  Swelling in hips and thighs. Feels better, though. Arm swelling down.   Review of systems:   Reports shortness of breath and states needed oxygen at one point overnight Denies chest pain  Appetite coming back; still nausea sometimes and still using PRN's  Diarrhea resolved  ---------------- Background on referral:  Jose [name] is a 46 y.o. male with a history of CKD, diabetes, hypertension, prior thoracic osteomyelitis who presented to the hospital with nausea with vomiting and diarrhea.  His creatinine was noted to be elevated at 4.01.  Creatinine history is below.  From mid August to present his creatinine has been 4.  Earlier this year creatinine between 2.1 and 2.48.  He has followed with Dr. Hollie Salk at our office.  He received fluids initially and these were stopped.  Suspect ins/outs not fully captured but had 650 ml UOP over 9/4 documented.  Nephrology is consulted for assistance with management.  He recently saw Dr. Hollie Salk on 8/31 for hospital follow-up.  On 06/24/22 his Cr was 3.61, BUN 42, eGFR 20, K 5.6, bicarb 17.  She wasn't sure how much recovery to really expect.  She had started a taper off minoxidil and had started torsemide 40 mg daily.  He states he really only took the torsemide for a day (because he later had n/v and wasn't able to keep it down).  He's not completely off the minodixil; he had been on it for a few weeks.  Diarrhea is a little better.  N/v is better but he states has gotten lots of PRN's. He does feel like he's having trouble getting a deep breath and tightness in his legs.  He was ordered fluids for 10 hours; these expired 13 hours or so ago and are still running.  New RN has just picked up  his care and is at bedside.  She turned the fluids off.  His mother and brother have had kidney disease; his brother's was after a heart attack.       Intake/Output Summary (Last 24 hours) at 07/01/2022 1014 Last data filed at 07/01/2022 0853 Gross per 24 hour  Intake 1810 ml  Output 2450 ml  Net -640 ml    Vitals:  Vitals:   06/30/22 1414 07/01/22 0627 07/01/22 0628 07/01/22 0849  BP:  (!) 158/83 (!) 158/83 (!) 160/84  Pulse: 76 70 70 74  Resp:  16 16 18   Temp:  98.5 F (36.9 C) 98.5 F (36.9 C) 98.2 F (36.8 C)  TempSrc:  Oral Oral Oral  SpO2: 97% 95% 95% 98%  Weight:      Height:         Physical Exam:   General: adult male in bed in NAD HEENT: NCAT Eyes: EOMI sclera anicteric Neck: supple trachea midline  Heart: S1S2 no rub Lungs: clear to auscultation; unlabored at rest on room air Abdomen: soft/nt/nd/obese habitus; abd wall edema Extremities: 2+ edema (thighs); compression stockings on  Skin: no rash on extremities exposed Psych normal mood and affect Neuro:  alert and oriented x 3 provides hx and follows commands  Medications reviewed   Labs:     Latest Ref Rng & Units 07/01/2022    9:02 AM 06/30/2022  1:48 AM 06/29/2022    1:15 AM  BMP  Glucose 70 - 99 mg/dL 126  140  118   BUN 6 - 20 mg/dL 36  37  42   Creatinine 0.61 - 1.24 mg/dL 3.72  3.66  3.74   Sodium 135 - 145 mmol/L 140  141  140   Potassium 3.5 - 5.1 mmol/L 4.9  4.8  4.4   Chloride 98 - 111 mmol/L 110  113  112   CO2 22 - 32 mmol/L 21  23  21    Calcium 8.9 - 10.3 mg/dL 8.7  8.6  8.3      Assessment/Plan:   # CKD stage IV   - Likely 2/2 DM and HTN.  Setting of overload  - Continue Lasix 80 mg IV daily  - additional lasix 80 mg IV once this afternoon - would limit gabapentin to no more than 300 mg over the course of a day given his CKD    # HTN  - lasix as above  - continue other current regimen   # Metabolic acidosis - From Diarrhea and CKD - on sodium bicarbonate 650 mg BID for now    # Diarrhea  - covid was negative - work-up per primary team    # Normocytic anemia - Suspect contribution from his CKD.  Mild iron deficiency - defer IV iron in favor of PO - Started aranesp 40 mcg weekly on Wednesdays and may need to increase    # DM type 2  - per primary team     # Chills  - recent osteo - work-up per primary team    Disposition - would continue inpatient monitoring and IV diuresis. Hopeful for the transition to PO diuretics in a couple of days   Claudia Desanctis, MD 07/01/2022 10:29 AM

## 2022-07-01 NOTE — Progress Notes (Signed)
[facility] Gastroenterology Progress Note  Jose [name] 46 y.o. 29-Sep-1976  CC: Nausea vomiting and diarrhea   Subjective: Patient seen and examined at bedside.  Diarrhea resolved.  No further vomiting.  Continues to have intermittent nausea.  Denies abdominal pain.  ROS : Afebrile, negative for chest pain   Objective: Vital signs in last 24 hours: Vitals:   07/01/22 0628 07/01/22 0849  BP: (!) 158/83 (!) 160/84  Pulse: 70 74  Resp: 16 18  Temp: 98.5 F (36.9 C) 98.2 F (36.8 C)  SpO2: 95% 98%    Physical Exam:  General:  Alert, cooperative, no distress, appears stated age  Head:  Normocephalic, without obvious abnormality, atraumatic  Eyes:  , EOM's intact,   Lungs:   Clear to auscultation bilaterally, respirations unlabored  Heart:  Regular rate and rhythm, S1, S2 normal  Abdomen:   Soft, non-tender, bowel sounds active all four quadrants,  no masses,   Extremities: Extremities normal, atraumatic, no  edema  Pulses: 2+ and symmetric    Lab Results: Recent Labs    06/30/22 0148 07/01/22 0902  NA 141 140  K 4.8 4.9  CL 113* 110  CO2 23 21*  GLUCOSE 140* 126*  BUN 37* 36*  CREATININE 3.66* 3.72*  CALCIUM 8.6* 8.7*  MG 1.9 1.9  PHOS 4.5 4.7*   Recent Labs    06/30/22 0148 07/01/22 0902  AST 12* 14*  ALT 17 14  ALKPHOS 127* 124  BILITOT 0.5 0.5  PROT 5.6* 6.0*  ALBUMIN 3.0* 3.2*   Recent Labs    06/30/22 0148 07/01/22 0902  WBC 5.6 5.9  NEUTROABS 2.6 3.3  HGB 7.5* 8.1*  HCT 22.0* 23.7*  MCV 90.5 89.4  PLT 178 177   No results for input(s): "LABPROT", "INR" in the last 72 hours.    Assessment/Plan: -Nausea vomiting and diarrhea.  Most likely gastroenteritis.  Diarrhea and vomiting resolved.  Continues to have nausea.   -Stage IV chronic kidney disease -Chronic anemia -History of esophagitis.   Recommendations ------------------------- -Patient currently not on PPI.  Start Protonix 40 mg daily for history of esophagitis. -Add Carafate 1  g twice daily for 7 days.  Avoid long-term use of Carafate because of chronic kidney disease. -GI will follow.   Otis Brace MD, [place] 07/01/2022, 10:19 AM  Contact #  [phone]

## 2022-07-02 DIAGNOSIS — E8809 Other disorders of plasma-protein metabolism, not elsewhere classified: Secondary | ICD-10-CM

## 2022-07-02 DIAGNOSIS — I10 Essential (primary) hypertension: Secondary | ICD-10-CM | POA: Diagnosis not present

## 2022-07-02 DIAGNOSIS — N179 Acute kidney failure, unspecified: Secondary | ICD-10-CM | POA: Diagnosis not present

## 2022-07-02 DIAGNOSIS — D649 Anemia, unspecified: Secondary | ICD-10-CM | POA: Diagnosis not present

## 2022-07-02 DIAGNOSIS — R112 Nausea with vomiting, unspecified: Secondary | ICD-10-CM | POA: Diagnosis not present

## 2022-07-02 LAB — HEMOGLOBIN AND HEMATOCRIT, BLOOD
HCT: 24.5 % — ABNORMAL LOW (ref 39.0–52.0)
Hemoglobin: 8.7 g/dL — ABNORMAL LOW (ref 13.0–17.0)

## 2022-07-02 LAB — CBC WITH DIFFERENTIAL/PLATELET
Abs Immature Granulocytes: 0.01 10*3/uL (ref 0.00–0.07)
Basophils Absolute: 0.1 10*3/uL (ref 0.0–0.1)
Basophils Relative: 1 %
Eosinophils Absolute: 0.2 10*3/uL (ref 0.0–0.5)
Eosinophils Relative: 5 %
HCT: 20.3 % — ABNORMAL LOW (ref 39.0–52.0)
Hemoglobin: 7 g/dL — ABNORMAL LOW (ref 13.0–17.0)
Immature Granulocytes: 0 %
Lymphocytes Relative: 34 %
Lymphs Abs: 1.8 10*3/uL (ref 0.7–4.0)
MCH: 30.6 pg (ref 26.0–34.0)
MCHC: 34.5 g/dL (ref 30.0–36.0)
MCV: 88.6 fL (ref 80.0–100.0)
Monocytes Absolute: 0.6 10*3/uL (ref 0.1–1.0)
Monocytes Relative: 12 %
Neutro Abs: 2.5 10*3/uL (ref 1.7–7.7)
Neutrophils Relative %: 48 %
Platelets: 172 10*3/uL (ref 150–400)
RBC: 2.29 MIL/uL — ABNORMAL LOW (ref 4.22–5.81)
RDW: 11.9 % (ref 11.5–15.5)
WBC: 5.2 10*3/uL (ref 4.0–10.5)
nRBC: 0 % (ref 0.0–0.2)

## 2022-07-02 LAB — CULTURE, BLOOD (ROUTINE X 2)
Culture: NO GROWTH
Culture: NO GROWTH

## 2022-07-02 LAB — COMPREHENSIVE METABOLIC PANEL
ALT: 13 U/L (ref 0–44)
AST: 11 U/L — ABNORMAL LOW (ref 15–41)
Albumin: 2.7 g/dL — ABNORMAL LOW (ref 3.5–5.0)
Alkaline Phosphatase: 106 U/L (ref 38–126)
Anion gap: 8 (ref 5–15)
BUN: 39 mg/dL — ABNORMAL HIGH (ref 6–20)
CO2: 25 mmol/L (ref 22–32)
Calcium: 8.4 mg/dL — ABNORMAL LOW (ref 8.9–10.3)
Chloride: 107 mmol/L (ref 98–111)
Creatinine, Ser: 3.72 mg/dL — ABNORMAL HIGH (ref 0.61–1.24)
GFR, Estimated: 19 mL/min — ABNORMAL LOW (ref >60)
Glucose, Bld: 153 mg/dL — ABNORMAL HIGH (ref 70–99)
Potassium: 4.7 mmol/L (ref 3.5–5.1)
Sodium: 140 mmol/L (ref 135–145)
Total Bilirubin: 0.9 mg/dL (ref 0.3–1.2)
Total Protein: 5.1 g/dL — ABNORMAL LOW (ref 6.5–8.1)

## 2022-07-02 LAB — PREPARE RBC (CROSSMATCH)

## 2022-07-02 LAB — MAGNESIUM: Magnesium: 1.8 mg/dL (ref 1.7–2.4)

## 2022-07-02 LAB — GLUCOSE, CAPILLARY
Glucose-Capillary: 112 mg/dL — ABNORMAL HIGH (ref 70–99)
Glucose-Capillary: 163 mg/dL — ABNORMAL HIGH (ref 70–99)
Glucose-Capillary: 106 mg/dL — ABNORMAL HIGH (ref 70–99)
Glucose-Capillary: 156 mg/dL — ABNORMAL HIGH (ref 70–99)

## 2022-07-02 LAB — PHOSPHORUS: Phosphorus: 4.4 mg/dL (ref 2.5–4.6)

## 2022-07-02 MED ORDER — SODIUM CHLORIDE 0.9% IV SOLUTION: Freq: Once | INTRAVENOUS | Status: AC

## 2022-07-02 MED ORDER — FUROSEMIDE 10 MG/ML IJ SOLN
80.0000 mg | Freq: Once | INTRAMUSCULAR | Status: AC
Start: 2022-07-02 — End: 2022-07-02
  Administered 2022-07-02: 80 mg via INTRAVENOUS
  Filled 2022-07-02: qty 8

## 2022-07-02 NOTE — Progress Notes (Signed)
[facility] Progress Note  Name: Jose [name] MRN: [id number] DOB: [birth year]  Chief Complaint:  N/v and diarrhea  Subjective:  He had 3.8 liters UOP over 9/7.  He and I discussed risks/benefits/indications for transfusion of blood and he does consent to blood transfusion.  He has had some lightheadedness with getting up and hopes this will help    Review of systems:   Reports shortness of breath with exertion  Denies chest pain  Nausea better  Diarrhea resolved  ---------------- Background on referral:  Jose [name] is a 46 y.o. male with a history of CKD, diabetes, hypertension, prior thoracic osteomyelitis who presented to the hospital with nausea with vomiting and diarrhea.  His creatinine was noted to be elevated at 4.01.  Creatinine history is below.  From mid August to present his creatinine has been 4.  Earlier this year creatinine between 2.1 and 2.48.  He has followed with Dr. Hollie Salk at our office.  He received fluids initially and these were stopped.  Suspect ins/outs not fully captured but had 650 ml UOP over 9/4 documented.  Nephrology is consulted for assistance with management.  He recently saw Dr. Hollie Salk on 8/31 for hospital follow-up.  On 06/24/22 his Cr was 3.61, BUN 42, eGFR 20, K 5.6, bicarb 17.  She wasn't sure how much recovery to really expect.  She had started a taper off minoxidil and had started torsemide 40 mg daily.  He states he really only took the torsemide for a day (because he later had n/v and wasn't able to keep it down).  He's not completely off the minodixil; he had been on it for a few weeks.  Diarrhea is a little better.  N/v is better but he states has gotten lots of PRN's. He does feel like he's having trouble getting a deep breath and tightness in his legs.  He was ordered fluids for 10 hours; these expired 13 hours or so ago and are still running.  New RN has just picked up his care and is at bedside.  She turned the fluids off.  His  mother and brother have had kidney disease; his brother's was after a heart attack.       Intake/Output Summary (Last 24 hours) at 07/02/2022 1021 Last data filed at 07/01/2022 2220 Gross per 24 hour  Intake 477 ml  Output 3750 ml  Net -3273 ml    Vitals:  Vitals:   07/01/22 0849 07/01/22 1438 07/01/22 2205 07/02/22 0748  BP: (!) 160/84 139/74  (!) 167/81  Pulse: 74 73 74 71  Resp: 18 18 18    Temp: 98.2 F (36.8 C) 98.6 F (37 C) 98.4 F (36.9 C) 98.1 F (36.7 C)  TempSrc: Oral Oral Oral Oral  SpO2: 98% 94% 96% 96%  Weight:      Height:         Physical Exam:    General: adult male in bed in NAD HEENT: NCAT Eyes: EOMI sclera anicteric Neck: supple trachea midline  Heart: S1S2 no rub Lungs: clear to auscultation; unlabored at rest on room air Abdomen: soft/nt/nd/obese habitus; abd wall edema Extremities: 1-2+ edema (thighs); compression stockings on  Skin: no rash on extremities exposed Psych normal mood and affect Neuro:  alert and oriented x 3 provides hx and follows commands  Medications reviewed   Labs:     Latest Ref Rng & Units 07/02/2022   12:32 AM 07/01/2022    9:02 AM 06/30/2022    1:48  AM  BMP  Glucose 70 - 99 mg/dL 153  126  140   BUN 6 - 20 mg/dL 39  36  37   Creatinine 0.61 - 1.24 mg/dL 3.72  3.72  3.66   Sodium 135 - 145 mmol/L 140  140  141   Potassium 3.5 - 5.1 mmol/L 4.7  4.9  4.8   Chloride 98 - 111 mmol/L 107  110  113   CO2 22 - 32 mmol/L 25  21  23    Calcium 8.9 - 10.3 mg/dL 8.4  8.7  8.6      Assessment/Plan:   # CKD stage IV   - Likely 2/2 DM and HTN.  Setting of overload  - Continue Lasix 80 mg IV daily.  Repeat dose this afternoon as well    - would limit gabapentin to no more than 300 mg over the course of a day given his CKD  - would please choose an alternative to carafate given his renal failure  - daily weights    # HTN  - lasix as above  - continue other current regimen   # Metabolic acidosis - From Diarrhea and CKD -  will stop sodium bicarb   # Diarrhea  - covid was negative - work-up per primary team  - improved    # Normocytic anemia - Suspect contribution from his CKD.  Mild iron deficiency - defer IV iron in favor of PO - Started aranesp 40 mcg weekly on Wednesdays and may need to increase if remains  - will transfuse 1 unit of PRBC's   # DM type 2  - per primary team     # Chills  - recent osteo - work-up per primary team    Disposition - would continue inpatient monitoring and IV diuresis. Hopeful for the transition to PO diuretics as early as tomorrow pending his course   Claudia Desanctis, MD 07/02/2022 10:52 AM

## 2022-07-02 NOTE — Progress Notes (Signed)
BLOOD STOPPED W/O any complaints or issues.  Vitals WNLs. H&H ordered.

## 2022-07-02 NOTE — Progress Notes (Signed)
PROGRESS NOTE    Jose [name]  BMW:[id number] DOB: 1976-07-07 DOA: 06/27/2022 PCP: Madelon Lips, MD   Brief Narrative:  The patient is a obese Caucasian male with a past medical history significant for but not limited to hypertension, hyperlipidemia, diabetes mellitus type 2, history of chronic kidney disease stage IV, history of thoracic osteomyelitis/discitis as well as other comorbidities who presented to the ED with complaints of intractable nausea and vomiting with associated loose bowel movements and diarrhea as well.  He states that he has noticed that he may continue to retain some fluid since he got discharged in the hospital last.  Denies any sick contacts and had some mild abdominal pain but is exercising.  Also has pain and back pain and shaking and has spasms in his back pain is not changed from his prior visit.  He denies fevers at home but had more core temperature of 99.5 yesterday He was in bed with covers.  He has been nauseous.  A lot last time and has not really thrown up to 4-13 times and has used Zofran at home which has helped but he has not been able to eat anything.  Is resting admitted for nausea vomiting AKI on CKD stage IV from 8/9 to 06/08/2022.  He was presented with SIRS criteria and concern for recurrent osteomyelitis infection.  Imaging at that time showed no acute changes and ID neurosurgery consulted and blood cultures remain negative.  There were no concerns for recurrent osteomyelitis at that time and he does have hypertensive urgency and blood pressure medications were adjusted.  Currently he denies any fevers but has had chills and headaches as well as leg swelling.  He was seen by his nephrologist on Thursday and placed on a diuretic of torsemide.  In the ED he was given 500 mill bolus as well as placed on maintenance IV fluids at 75 mils per hour for 10 hours.  We will remove this continue to monitor his renal function carefully given that he continues have  intractable nausea vomiting.  CT scan of the abdomen pelvis was done and showed "There is no evidence of intestinal obstruction or pneumoperitoneum. There is no hydronephrosis.   There is liquid stool in the lumen of colon and rectum. This may be a normal variation or suggest  nonspecific enterocolitis. There is no significant wall thickening or pericolic stranding. Gallbladder stones. Small hiatal hernia. Small bilateral pleural effusions. There are small patchy infiltrates in both lower lung fields suggesting atelectasis/pneumonia.  cardiomegaly. Coronary artery calcifications are seen."   Patient is diarrhea is improving however he continues to be significantly nauseous and have some vomiting but this is improving slightly.  Renal function continues to improve a little bit but he appears extremely volume overloaded so nephrology been consulted for assistance.  Given that he continues to be intermittently nauseous and vomited all day yesterday we will consult GI for further evaluation.  His nausea vomiting and diarrhea improving slowly and GI recommends continuing supportive care for now.  They are recommending advancing diet as tolerated and obtain a GI pathogen panel and C. difficile if recurrence of diarrhea occurs.  IV fluids have stopped and nephrology has been evaluating and diuresing the patient daily and have given him a dose of extra Lasix this afternoon again as well as 1 unit of blood given his hemoglobin dropping to 7.  He continues to improve slowly.  Assessment and Plan: * Intractable nausea and vomiting with associated diarrhea, improving -46 year  old with 2 day history intractable N/V/D in setting of progressing CKD -Place in observation telemetry -anti-emetics,slowly advance diet -check stool studies/quick c.diff. no hx of sick contacts. Had brief IV abx during last hospitalization a few weeks ago -Was recently started on a diuretic on Thursday however had intractable nausea vomiting  and this has been held given concern for dehydration and now being resumed by nephrology -Strict I/O's and see below -Hold bethanechol -Continue with antiemetics with p.o./IV Ondansetron 4 mg may need Compazine; his nausea vomiting is improving and continues to have some mild nausea but this is improved -GI has been consulted and feel that he has gastroenteritis and recommends continuing supportive care given that his symptoms are improving and recommending obtaining GI pathogen panel and C. difficile if he has recurrence of diarrhea; he had 1 episode of loose stool the day before yesterday but his vomiting stopped and he continues to be slightly nauseous but improving slowly.  GI is now recommending starting the patient on a PPI and starting pantoprazole 40 mg daily for history of esophagitis and adding Carafate 1 g twice daily for 7 days and avoiding long-term use of Carafate given his chronic kidney disease but will stop it today at Nephrology recommendations  -We will continue Pantoprazole 40 g p.o. daily   Acute kidney injury superimposed on chronic kidney disease Stage IV (HCC) Metabolic Acidosis -Baseline creatinine was around 2-2.5;  -however, during recent hospitalization creatinine remained around 3.9.  -Nephrology was consulted and felt this would be closer to his new baseline.  Patient's BUNs/creatinine has now trended down to 42/3.74 -> 37/3.66 -> 36/3.72 -Avoid nephrotoxic medications, contrast dyes, hypotension and dehydration to ensure adequate renal perfusion and will need to renally adjust medications -Renal ultrasound 8/12 significant for suggested medical renal disease with mild increased cortical echogenicity of the kidneys -Strict I/O's and Daily Weights; he is -3,998 mL since admission -Patient's metabolic acidosis is improved and he has a CO2 of 25, anion gap of 8, chloride level 107 -Nephrology had started the patient on sodium bicarbonate 650 mg p.o. 3 times daily but are  now stopping  -IV fluid hydration was ordered for 10 hours however it was continuously running for some reason and this is stopped by the nephrologist and now he will be getting IV Lasix with albumin: Now he is on IV Lasix 80 mg daily and this is being continued but nephrology will be given again another IV 80 mg Lasix dose this afternoon -UA with ketones and protein, similar to last UA in setting of intractable N/V  -Continue to monitor and trend and repeat CMP in a.m he remains volume overloaded   Type 2 diabetes mellitus with chronic kidney disease, with long-term current use of insulin (HCC) -No anion gap, but he did have ketones in urine and has a slight metabolic acidosis.  -Recent A1C in 8/23 was 7.7 -On 70/30, substitute with semglee decreased dose of 8 units with decreased PO intake  -Has not started his mounjaro -Continue with Sensitive NovoLog SSI and accuchecks AC -CBGs ranging from 112-163   Vertebral Osteomyelitis (HCC) -No change in back pain, no leukocytosis, normal lactic acid -MRI during 8/9 admit with no evidence of recurrent OM -Continue pain control as he continues to have some spasms -Off antibiotics since April -Need close ID follow-up and may discuss with them but he appears to have no Leukocytosis but he continues to have the spasms and may need reimaging as an outpatient   Anemia of  Chronic Kidney Disease -Likely had a dilutional drop as patient's hemoglobin/hematocrit went from 8.8/24.0 -> 7.1/21.5 -> 7.2/21.5 -> 7.5/22.0 -> 8.1/23.7 and is now 7.0/20.3 -Check Anemia Panel and it showed an iron level of 56, U IBC 190, TIBC 246, saturation greater than 23%, ferritin level 110 -Received a dose of Aranesp -Allergies type and screen anxious using 1 unit PRBCs given his low blood count and his slight dizziness this morning -Continue to monitor for signs and symptoms of bleeding; no overt bleeding noted -Repeat CBC in a.m.   GAD (generalized anxiety  disorder) -Continue Paroxetine 20 mg p.o. nightly, Mirtazapine 50 mg p.o. nightly and Hydroxyzine 25 mg p.o. 3 times daily as needed anxiety nausea   Essential Hypertension -Normotensive-mildly elevated, but improved from HTN urgency on prior admit and he has not taken any of his medication  -Continue home medication of Amlodipine 10mg  daily, Hydralazine 50mg  TID and Metoprolol 75mg  BID and will Titrate as needed  -Continue monitor blood pressures per protocol and last blood pressure reading was 161/82   Hyperlipidemia -Continue Atorvastatin 40 mg po Daily    Hypoalbuminemia -Patient's albumin level went from 3.2 is now 2.7 -Continue To monitor and trend and repeat CMP in the a.m.  Obesity -Complicates overall prognosis and care -Estimated body mass index is 31.9 kg/m as calculated from the following:   Height as of this encounter: 5\' 11"  (1.803 m).   Weight as of this encounter: 103.7 kg.  -Weight Loss and Dietary Counseling given  DVT prophylaxis: heparin injection 5,000 Units Start: 06/27/22 1445 Place TED hose Start: 06/27/22 1433    Code Status: Full Code Family Communication: No family currently at bedside  Disposition Plan:  Level of care: Med-Surg Status is: Inpatient Remains inpatient appropriate because: Still getting diuresed by nephrology given his volume overload and will be getting a unit of blood today.  Will need nephrology to clear the patient prior to safe discharge disposition and he is improved from a GI standpoint.   Consultants:  Gastroenterology Nephrology  Procedures:  As delineated as above  Antimicrobials:  Anti-infectives (From admission, onward)    None       Subjective: Seen and examined at bedside and he is doing a little bit better and is urinating more. Thinks Nausea is improved and stable. Had some lightheadedness. No CP and diarrhea is improved. Denies any other concerns or complaints at this time.   Objective: Vitals:   07/01/22  1438 07/01/22 2205 07/02/22 0748 07/02/22 1200  BP: 139/74  (!) 167/81 (!) 161/82  Pulse: 73 74 71 71  Resp: 18 18  18   Temp: 98.6 F (37 C) 98.4 F (36.9 C) 98.1 F (36.7 C) 98.4 F (36.9 C)  TempSrc: Oral Oral Oral Oral  SpO2: 94% 96% 96% 95%  Weight:      Height:        Intake/Output Summary (Last 24 hours) at 07/02/2022 1519 Last data filed at 07/02/2022 1257 Gross per 24 hour  Intake 477 ml  Output 4680 ml  Net -4203 ml   Filed Weights   06/27/22 0834 06/27/22 1921 06/30/22 0547  Weight: 105.2 kg 100.8 kg 103.7 kg   Examination: Physical Exam:  Constitutional: WN/WD obese Caucasian male in NAD appears calm and a little more comfortable  Respiratory: Diminished to auscultation bilaterally, no wheezing, rales, rhonchi or crackles. Normal respiratory effort and patient is not tachypenic. No accessory muscle use. Unlabored breathing  Cardiovascular: RRR, no murmurs / rubs / gallops. S1 and  S2 auscultated. 1+ LE Edema Abdomen: Soft, non-tender, Distended 2/2 body habitus. Bowel sounds positive.  GU: Deferred. Musculoskeletal: No clubbing / cyanosis of digits/nails. No joint deformity upper and lower extremities Skin: No rashes, lesions, ulcers on a limited skin evaluation. No induration; Warm and dry.  Neurologic: CN 2-12 grossly intact with no focal deficits.  Romberg sign and cerebellar reflexes not assessed.  Psychiatric: Normal judgment and insight. Alert and oriented x 3. Normal mood and appropriate affect.   Data Reviewed: I have personally reviewed following labs and imaging studies  CBC: Recent Labs  Lab 06/27/22 0824 06/27/22 0933 06/28/22 0051 06/29/22 0115 06/30/22 0148 07/01/22 0902 07/02/22 0032  WBC 9.6  --  8.1 6.5 5.6 5.9 5.2  NEUTROABS 7.9*  --   --  3.2 2.6 3.3 2.5  HGB 8.8*   < > 7.1* 7.2* 7.5* 8.1* 7.0*  HCT 26.7*   < > 21.5* 21.5* 22.0* 23.7* 20.3*  MCV 90.8  --  90.7 90.3 90.5 89.4 88.6  PLT 222  --  205 176 178 177 172   < > = values in this  interval not displayed.   Basic Metabolic Panel: Recent Labs  Lab 06/28/22 0051 06/29/22 0115 06/30/22 0148 07/01/22 0902 07/02/22 0032  NA 141 140 141 140 140  K 4.6 4.4 4.8 4.9 4.7  CL 114* 112* 113* 110 107  CO2 18* 21* 23 21* 25  GLUCOSE 131* 118* 140* 126* 153*  BUN 47* 42* 37* 36* 39*  CREATININE 3.89* 3.74* 3.66* 3.72* 3.72*  CALCIUM 8.0* 8.3* 8.6* 8.7* 8.4*  MG  --  2.0 1.9 1.9 1.8  PHOS  --  4.1 4.5 4.7* 4.4   GFR: Estimated Creatinine Clearance: 29.8 mL/min (A) (by C-G formula based on SCr of 3.72 mg/dL (H)). Liver Function Tests: Recent Labs  Lab 06/27/22 0824 06/29/22 0115 06/30/22 0148 07/01/22 0902 07/02/22 0032  AST 17 10* 12* 14* 11*  ALT 28 17 17 14 13   ALKPHOS 167* 126 127* 124 106  BILITOT 0.9 0.4 0.5 0.5 0.9  PROT 6.4* 5.2* 5.6* 6.0* 5.1*  ALBUMIN 3.4* 2.8* 3.0* 3.2* 2.7*   Recent Labs  Lab 06/27/22 0824  LIPASE 32   No results for input(s): "AMMONIA" in the last 168 hours. Coagulation Profile: Recent Labs  Lab 06/27/22 0824  INR 1.1   Cardiac Enzymes: No results for input(s): "CKTOTAL", "CKMB", "CKMBINDEX", "TROPONINI" in the last 168 hours. BNP (last 3 results) No results for input(s): "PROBNP" in the last 8760 hours. HbA1C: No results for input(s): "HGBA1C" in the last 72 hours. CBG: Recent Labs  Lab 07/01/22 1146 07/01/22 1655 07/01/22 2210 07/02/22 0748 07/02/22 1159  GLUCAP 80 112* 166* 112* 163*   Lipid Profile: No results for input(s): "CHOL", "HDL", "LDLCALC", "TRIG", "CHOLHDL", "LDLDIRECT" in the last 72 hours. Thyroid Function Tests: No results for input(s): "TSH", "T4TOTAL", "FREET4", "T3FREE", "THYROIDAB" in the last 72 hours. Anemia Panel: Recent Labs    06/29/22 1828  FERRITIN 110  TIBC 246*  IRON 56   Sepsis Labs: Recent Labs  Lab 06/27/22 0924 06/27/22 1921  LATICACIDVEN 0.7 0.9    Recent Results (from the past 240 hour(s))  SARS Coronavirus 2 by RT PCR (hospital order, performed in [facility]  hospital lab) *cepheid single result test* Peripheral     Status: None   Collection Time: 06/27/22  9:03 AM   Specimen: Peripheral; Nasal Swab  Result Value Ref Range Status   SARS Coronavirus 2 by RT PCR  NEGATIVE NEGATIVE Final    Comment: (NOTE) SARS-CoV-2 target nucleic acids are NOT DETECTED.  The SARS-CoV-2 RNA is generally detectable in upper and lower respiratory specimens during the acute phase of infection. The lowest concentration of SARS-CoV-2 viral copies this assay can detect is 250 copies / mL. A negative result does not preclude SARS-CoV-2 infection and should not be used as the sole basis for treatment or other patient management decisions.  A negative result may occur with improper specimen collection / handling, submission of specimen other than nasopharyngeal swab, presence of viral mutation(s) within the areas targeted by this assay, and inadequate number of viral copies (<250 copies / mL). A negative result must be combined with clinical observations, patient history, and epidemiological information.  Fact Sheet for Patients:   https://www.patel.info/  Fact Sheet for Healthcare Providers: https://hall.com/  This test is not yet approved or  cleared by the [place] FDA and has been authorized for detection and/or diagnosis of SARS-CoV-2 by FDA under an Emergency Use Authorization (EUA).  This EUA will remain in effect (meaning this test can be used) for the duration of the COVID-19 declaration under Section 564(b)(1) of the Act, 21 U.S.C. section 360bbb-3(b)(1), unless the authorization is terminated or revoked sooner.  Performed at [facility], [place] [address] [place], [place] [address]   Blood Culture (routine x 2)     Status: None   Collection Time: 06/27/22  9:08 AM   Specimen: BLOOD RIGHT WRIST  Result Value Ref Range Status   Specimen Description BLOOD RIGHT WRIST  Final   Special Requests    Final    BOTTLES DRAWN AEROBIC AND ANAEROBIC Blood Culture results may not be optimal due to an inadequate volume of blood received in culture bottles   Culture   Final    NO GROWTH 5 DAYS Performed at [facility], [place] [address], [place], [place] [address]    Report Status 07/02/2022 FINAL  Final  Blood Culture (routine x 2)     Status: None   Collection Time: 06/27/22  9:18 AM   Specimen: BLOOD LEFT FOREARM  Result Value Ref Range Status   Specimen Description BLOOD LEFT FOREARM  Final   Special Requests   Final    BOTTLES DRAWN AEROBIC AND ANAEROBIC Blood Culture results may not be optimal due to an inadequate volume of blood received in culture bottles   Culture   Final    NO GROWTH 5 DAYS Performed at [facility], [place] [address] [place], [place] [address]    Report Status 07/02/2022 FINAL  Final    Radiology Studies: No results found.  Scheduled Meds:  amLODipine  10 mg Oral Daily   atorvastatin  40 mg Oral Daily   darbepoetin (ARANESP) injection - NON-DIALYSIS  40 mcg Subcutaneous Q Wed-1800   furosemide  80 mg Intravenous Daily   furosemide  80 mg Intravenous Once   gabapentin  200 mg Oral BID   heparin  5,000 Units Subcutaneous Q8H   insulin aspart  0-9 Units Subcutaneous TID WC   insulin glargine-yfgn  8 Units Subcutaneous QHS   iron polysaccharides  150 mg Oral Daily   metoprolol tartrate  75 mg Oral BID   mirtazapine  15 mg Oral QHS   pantoprazole  40 mg Oral Daily   PARoxetine  20 mg Oral QHS   tamsulosin  0.4 mg Oral QPC supper   Continuous Infusions:   LOS: 3 days  Raiford Noble, DO [facility] Available via Epic secure chat 7am-7pm After these hours, please refer to coverage provider listed on [email or web address] 07/02/2022, 3:19 PM

## 2022-07-02 NOTE — Plan of Care (Signed)

## 2022-07-02 NOTE — Plan of Care (Signed)
  Problem: Health Behavior/Discharge Planning: Goal: Ability to manage health-related needs will improve Outcome: Progressing   Problem: Clinical Measurements: Goal: Ability to maintain clinical measurements within normal limits will improve Outcome: Progressing   Problem: Activity: Goal: Risk for activity intolerance will decrease Outcome: Progressing   Problem: Elimination: Goal: Will not experience complications related to bowel motility Outcome: Progressing   Problem: Pain Managment: Goal: General experience of comfort will improve Outcome: Progressing   Problem: Safety: Goal: Ability to remain free from injury will improve Outcome: Progressing

## 2022-07-02 NOTE — Progress Notes (Signed)
[facility] Gastroenterology Progress Note  Jose [name] 46 y.o. 22-May-1976  CC: Nausea vomiting and diarrhea   Subjective: Patient seen and examined at bedside.  Nausea improved.  Denies vomiting.  No diarrhea.  Overall feeling better.  ROS : Afebrile, negative for chest pain   Objective: Vital signs in last 24 hours: Vitals:   07/02/22 0748 07/02/22 1200  BP: (!) 167/81 (!) 161/82  Pulse: 71 71  Resp:  18  Temp: 98.1 F (36.7 C) 98.4 F (36.9 C)  SpO2: 96% 95%    Physical Exam:  General:  Alert, cooperative, no distress, appears stated age  Head:  Normocephalic, without obvious abnormality, atraumatic  Eyes:  , EOM's intact,   Lungs:   Clear to auscultation bilaterally, respirations unlabored  Heart:  Regular rate and rhythm, S1, S2 normal  Abdomen:   Soft, non-tender, bowel sounds active all four quadrants,  no masses,   Extremities: Extremities normal, atraumatic, no  edema  Pulses: 2+ and symmetric    Lab Results: Recent Labs    07/01/22 0902 07/02/22 0032  NA 140 140  K 4.9 4.7  CL 110 107  CO2 21* 25  GLUCOSE 126* 153*  BUN 36* 39*  CREATININE 3.72* 3.72*  CALCIUM 8.7* 8.4*  MG 1.9 1.8  PHOS 4.7* 4.4   Recent Labs    07/01/22 0902 07/02/22 0032  AST 14* 11*  ALT 14 13  ALKPHOS 124 106  BILITOT 0.5 0.9  PROT 6.0* 5.1*  ALBUMIN 3.2* 2.7*   Recent Labs    07/01/22 0902 07/02/22 0032  WBC 5.9 5.2  NEUTROABS 3.3 2.5  HGB 8.1* 7.0*  HCT 23.7* 20.3*  MCV 89.4 88.6  PLT 177 172   No results for input(s): "LABPROT", "INR" in the last 72 hours.    Assessment/Plan: -Nausea vomiting and diarrhea.  Most likely gastroenteritis.  Diarrhea and vomiting resolved.  Continues to have nausea.   -Stage IV chronic kidney disease -Chronic anemia -History of esophagitis.   Recommendations ------------------------- -Continue Protonix 40 mg daily. -Continue Carafate 1 g twice daily for 7 days.  Avoid long-term use of Carafate because of chronic kidney  disease. -No further inpatient GI work-up planned.  GI will sign off.  Follow-up in GI clinic in 3 months after discharge.   Otis Brace MD, [place] 07/02/2022, 4:08 PM  Contact #  [phone]

## 2022-07-02 NOTE — Progress Notes (Addendum)
Mobility Specialist Progress Note   07/02/22 1015  Mobility  Activity Ambulated with assistance in hallway  Level of Assistance Contact guard assist, steadying assist  Assistive Device Front wheel walker  Distance Ambulated (ft) 272 ft  Activity Response Tolerated well  $Mobility charge 1 Mobility   Pre Mobility: 71 HR, 160/88 BP, 96% SpO2 on RA Post Mobility: 75 HR, 154/81 BP, 89% SpO2 on RA  Received pt in bed c/o slight nausea w/ general soreness around body but agreeable. Required x1 standing and seated rest break d/t heavy UE reliance, cued on proper RW mechanics. Returned back to EOB safely and call bell left in reach.    Holland Falling Mobility Specialist MS [facility] #:  [phone] Acute Rehab Office:  [phone]

## 2022-07-02 NOTE — TOC Initial Note (Signed)
Transition of Care [facility]) - Initial/Assessment Note    Patient Details  Name: Jose [name] MRN: [id number] Date of Birth: 04/02/1976  Transition of Care [facility]) CM/SW Contact:    Sharin Mons, RN Phone Number: 07/02/2022, 3:51 PM  Clinical Narrative:     Readmitted with intractable N/V. From home with wife. States independent with ADL's PTA. Uses cane with ambulating. Previous admit pt thought home health services were setup with [facility], however, per [facility] liaison no record shown.  Cory with [facility] will accept pt if home health services are needed @ d/c .   PT evaluation pending ....  Pt without transportation issues. Pt states has no problem affording Rx meds.   TOC team  following and will assist with needs.   Expected Discharge Plan: Home/Self Care Barriers to Discharge: Continued Medical Work up   Patient Goals and CMS Choice     Choice offered to / list presented to : Patient  Expected Discharge Plan and Services Expected Discharge Plan: Home/Self Care   Discharge Planning Services: CM Consult Post Acute Care Choice: [place] arrangements for the past 2 months: Single Family Home        Prior Living Arrangements/Services Living arrangements for the past 2 months: Single Family Home Lives with:: Spouse Patient language and need for interpreter reviewed:: Yes Do you feel safe going back to the place where you live?: Yes      Need for Family Participation in Patient Care: Yes (Comment) Care giver support system in place?: Yes (comment) Current home services: DME, Home PT (states has access to a cane , RW and BSC) Criminal Activity/Legal Involvement Pertinent to Current Situation/Hospitalization: No - Comment as needed  Activities of Daily Living      Permission Sought/Granted   Permission granted to share information with : Yes, Verbal Permission Granted  Share Information with NAME: Everlean Patterson  [phone]            Emotional Assessment Appearance:: Appears stated age Attitude/Demeanor/Rapport: Gracious Affect (typically observed): Accepting Orientation: : Oriented to Self, Oriented to Place, Oriented to  Time, Oriented to Situation Alcohol / Substance Use: Not Applicable Psych Involvement: No (comment)  Admission diagnosis:  Chronic kidney disease (CKD), stage IV (severe) (HCC) [N18.4] Intractable nausea and vomiting [R11.2] Abdominal pain, unspecified abdominal location [R10.9] Nausea and vomiting, unspecified vomiting type [R11.2] Patient Active Problem List   Diagnosis Date Noted   Hypoalbuminemia 07/02/2022   Intractable nausea and vomiting 06/27/2022   Type 2 diabetes mellitus with chronic kidney disease, with long-term current use of insulin ([place]) 06/06/2022   Hypertensive urgency 06/03/2022   SIRS (systemic inflammatory response syndrome) ([place]) 06/02/2022   Vertebral osteomyelitis ([place]) 12/19/2021   Chronic kidney disease 11/10/2021   Pathologic fracture of thoracic vertebrae, sequela 11/10/2021   Medication monitoring encounter 09/30/2021   GIB (gastrointestinal bleeding) 09/21/2021   Duodenal ulcer 09/21/2021   Esophagitis 09/21/2021   Osteomyelitis of sacrum ([place]) 09/21/2021   Hyponatremia    Acute kidney injury superimposed on chronic kidney disease ([place])    Acute blood loss anemia    Labile blood glucose    Thoracic spondylosis with myelopathy 09/02/2021   Debility 08/30/2021   Osteomyelitis of thoracic region ([place]) 08/06/2021   Essential hypertension 08/06/2021   CKD (chronic kidney disease) stage 3, GFR 30-59 ml/min (HCC) 08/06/2021   Pleural effusion, bilateral 08/06/2021   Diabetic ulcer of left great toe ([place])    MSSA bacteremia 07/23/2021   Constipation  07/23/2021   Lytic bone lesions on xray 07/22/2021   Anemia 05/19/2021   Nausea and vomiting 05/19/2021   Elevated blood pressure reading 05/19/2021   Bilateral carpal tunnel syndrome 01/23/2020   Numbness  12/12/2019   Microalbuminuria due to type 2 diabetes mellitus ([place]) 07/20/2019   GAD (generalized anxiety disorder) 06/18/2019   Adult ADHD 06/18/2019   Trigger middle finger of left hand 06/11/2019   Diabetic retinopathy ([place]) 07/13/2018   Tobacco use disorder 05/17/2018   Hyperlipidemia 05/17/2018   PCP:  Madelon Lips, MD Pharmacy:   [facility], [place] [address] AT [address] [address] Phone: [phone] Fax: [phone]  [facility] [address] [address] Phone: [phone] Fax: [phone]     Social Determinants of Health (SDOH) Interventions    Readmission Risk Interventions    07/30/2021    2:39 PM  Readmission Risk Prevention Plan  Transportation Screening Complete  PCP or Specialist Appt within 3-5 Days Complete  HRI or [place] Complete  Social Work Consult for [place] Planning/Counseling Complete  Palliative Care Screening Complete  Medication Review Press photographer) Complete

## 2022-07-03 DIAGNOSIS — I10 Essential (primary) hypertension: Secondary | ICD-10-CM | POA: Diagnosis not present

## 2022-07-03 DIAGNOSIS — D649 Anemia, unspecified: Secondary | ICD-10-CM | POA: Diagnosis not present

## 2022-07-03 DIAGNOSIS — R112 Nausea with vomiting, unspecified: Secondary | ICD-10-CM | POA: Diagnosis not present

## 2022-07-03 DIAGNOSIS — N179 Acute kidney failure, unspecified: Secondary | ICD-10-CM | POA: Diagnosis not present

## 2022-07-03 LAB — BPAM RBC
Blood Product Expiration Date: 202310052359
ISSUE DATE / TIME: 202309081703
Unit Type and Rh: 6200

## 2022-07-03 LAB — COMPREHENSIVE METABOLIC PANEL
ALT: 13 U/L (ref 0–44)
AST: 16 U/L (ref 15–41)
Albumin: 3.3 g/dL — ABNORMAL LOW (ref 3.5–5.0)
Alkaline Phosphatase: 116 U/L (ref 38–126)
Anion gap: 14 (ref 5–15)
BUN: 42 mg/dL — ABNORMAL HIGH (ref 6–20)
CO2: 25 mmol/L (ref 22–32)
Calcium: 8.9 mg/dL (ref 8.9–10.3)
Chloride: 101 mmol/L (ref 98–111)
Creatinine, Ser: 3.73 mg/dL — ABNORMAL HIGH (ref 0.61–1.24)
GFR, Estimated: 19 mL/min — ABNORMAL LOW (ref >60)
Glucose, Bld: 248 mg/dL — ABNORMAL HIGH (ref 70–99)
Potassium: 4.6 mmol/L (ref 3.5–5.1)
Sodium: 140 mmol/L (ref 135–145)
Total Bilirubin: 0.6 mg/dL (ref 0.3–1.2)
Total Protein: 6.1 g/dL — ABNORMAL LOW (ref 6.5–8.1)

## 2022-07-03 LAB — CBC WITH DIFFERENTIAL/PLATELET
Abs Immature Granulocytes: 0.03 10*3/uL (ref 0.00–0.07)
Basophils Absolute: 0 10*3/uL (ref 0.0–0.1)
Basophils Relative: 0 %
Eosinophils Absolute: 0.3 10*3/uL (ref 0.0–0.5)
Eosinophils Relative: 4 %
HCT: 27.9 % — ABNORMAL LOW (ref 39.0–52.0)
Hemoglobin: 9.7 g/dL — ABNORMAL LOW (ref 13.0–17.0)
Immature Granulocytes: 0 %
Lymphocytes Relative: 18 %
Lymphs Abs: 1.5 10*3/uL (ref 0.7–4.0)
MCH: 30.5 pg (ref 26.0–34.0)
MCHC: 34.8 g/dL (ref 30.0–36.0)
MCV: 87.7 fL (ref 80.0–100.0)
Monocytes Absolute: 0.9 10*3/uL (ref 0.1–1.0)
Monocytes Relative: 11 %
Neutro Abs: 5.3 10*3/uL (ref 1.7–7.7)
Neutrophils Relative %: 67 %
Platelets: 219 10*3/uL (ref 150–400)
RBC: 3.18 MIL/uL — ABNORMAL LOW (ref 4.22–5.81)
RDW: 12.2 % (ref 11.5–15.5)
WBC: 8 10*3/uL (ref 4.0–10.5)
nRBC: 0 % (ref 0.0–0.2)

## 2022-07-03 LAB — TYPE AND SCREEN
ABO/RH(D): A POS
Antibody Screen: NEGATIVE
Unit division: 0

## 2022-07-03 LAB — GLUCOSE, CAPILLARY
Glucose-Capillary: 160 mg/dL — ABNORMAL HIGH (ref 70–99)
Glucose-Capillary: 215 mg/dL — ABNORMAL HIGH (ref 70–99)
Glucose-Capillary: 145 mg/dL — ABNORMAL HIGH (ref 70–99)
Glucose-Capillary: 152 mg/dL — ABNORMAL HIGH (ref 70–99)
Glucose-Capillary: 189 mg/dL — ABNORMAL HIGH (ref 70–99)

## 2022-07-03 LAB — PHOSPHORUS: Phosphorus: 4.1 mg/dL (ref 2.5–4.6)

## 2022-07-03 LAB — MAGNESIUM: Magnesium: 1.7 mg/dL (ref 1.7–2.4)

## 2022-07-03 MED ORDER — TORSEMIDE 20 MG PO TABS
40.0000 mg | ORAL_TABLET | Freq: Every day | ORAL | Status: DC
Start: 2022-07-04 — End: 2022-07-05
  Administered 2022-07-04 – 2022-07-05 (×2): 40 mg via ORAL
  Filled 2022-07-03 (×2): qty 2

## 2022-07-03 MED ORDER — FUROSEMIDE 10 MG/ML IJ SOLN
80.0000 mg | Freq: Once | INTRAMUSCULAR | Status: AC
Start: 2022-07-03 — End: 2022-07-03
  Administered 2022-07-03: 80 mg via INTRAVENOUS
  Filled 2022-07-03: qty 8

## 2022-07-03 NOTE — Progress Notes (Signed)
PROGRESS NOTE    Jose [name]  SEG:[id number] DOB: 1976/04/20 DOA: 06/27/2022 PCP: Madelon Lips, MD   Brief Narrative:  The patient is a obese Caucasian male with a past medical history significant for but not limited to hypertension, hyperlipidemia, diabetes mellitus type 2, history of chronic kidney disease stage IV, history of thoracic osteomyelitis/discitis as well as other comorbidities who presented to the ED with complaints of intractable nausea and vomiting with associated loose bowel movements and diarrhea as well.  He states that he has noticed that he may continue to retain some fluid since he got discharged in the hospital last.  Denies any sick contacts and had some mild abdominal pain but is exercising.  Also has pain and back pain and shaking and has spasms in his back pain is not changed from his prior visit.  He denies fevers at home but had more core temperature of 99.5 yesterday He was in bed with covers.  He has been nauseous.  A lot last time and has not really thrown up to 4-13 times and has used Zofran at home which has helped but he has not been able to eat anything.  Is resting admitted for nausea vomiting AKI on CKD stage IV from 8/9 to 06/08/2022.  He was presented with SIRS criteria and concern for recurrent osteomyelitis infection.  Imaging at that time showed no acute changes and ID neurosurgery consulted and blood cultures remain negative.  There were no concerns for recurrent osteomyelitis at that time and he does have hypertensive urgency and blood pressure medications were adjusted.  Currently he denies any fevers but has had chills and headaches as well as leg swelling.  He was seen by his nephrologist on Thursday and placed on a diuretic of torsemide.  In the ED he was given 500 mill bolus as well as placed on maintenance IV fluids at 75 mils per hour for 10 hours.  We will remove this continue to monitor his renal function carefully given that he continues have  intractable nausea vomiting.  CT scan of the abdomen pelvis was done and showed "There is no evidence of intestinal obstruction or pneumoperitoneum. There is no hydronephrosis.   There is liquid stool in the lumen of colon and rectum. This may be a normal variation or suggest  nonspecific enterocolitis. There is no significant wall thickening or pericolic stranding. Gallbladder stones. Small hiatal hernia. Small bilateral pleural effusions. There are small patchy infiltrates in both lower lung fields suggesting atelectasis/pneumonia.  cardiomegaly. Coronary artery calcifications are seen."   Patient is diarrhea is improving however he continues to be significantly nauseous and have some vomiting but this is improving slightly.  Renal function continues to improve a little bit but he appears extremely volume overloaded so nephrology been consulted for assistance.  Given that he continues to be intermittently nauseous and vomited all day yesterday we will consult GI for further evaluation.  His nausea vomiting and diarrhea improving slowly and GI recommends continuing supportive care for now.  They are recommending advancing diet as tolerated and obtain a GI pathogen panel and C. difficile if recurrence of diarrhea occurs.   IV fluids have stopped and nephrology has been evaluating and diuresing the patient daily and have given him a dose of extra Lasix this afternoon again as well as 1 unit of blood given his hemoglobin dropping to 7.  He continues to improve slowly.   Assessment and Plan: * Intractable nausea and vomiting with associated diarrhea, improving  significantly -46 year old with 2 day history intractable N/V/D in setting of progressing CKD -Place in observation telemetry -anti-emetics,slowly advance diet -check stool studies/quick c.diff. no hx of sick contacts. Had brief IV abx during last hospitalization a few weeks ago -Was recently started on a diuretic on Thursday however had intractable  nausea vomiting and this has been held given concern for dehydration and now being resumed by nephrology -Strict I/O's and see below -Hold bethanechol -Continue with antiemetics with p.o./IV Ondansetron 4 mg may need Compazine; his nausea vomiting is improving and continues to have some mild nausea but this is improved -GI has been consulted and feel that he has gastroenteritis and recommends continuing supportive care given that his symptoms are improving and recommending obtaining GI pathogen panel and C. difficile if he has recurrence of diarrhea; he had 1 episode of loose stool the day before yesterday but his vomiting stopped and he continues to be slightly nauseous but improving slowly.  GI is now recommending starting the patient on a PPI and starting pantoprazole 40 mg daily for history of esophagitis and adding Carafate 1 g twice daily for 7 days and avoiding long-term use of Carafate given his chronic kidney disease but will per 07/02/2022 at the recommendations of nephrology recommendations -We will stop the stool studies given that his stools are improved -We will continue Pantoprazole 40 g p.o. daily   Acute kidney injury superimposed on chronic kidney disease Stage IV (HCC) Metabolic Acidosis -Baseline creatinine was around 2-2.5;  -however, during recent hospitalization creatinine remained around 3.9.  -Nephrology was consulted and felt this would be closer to his new baseline.  Patient's BUNs/creatinine has now trended down to 42/3.74 -> 37/3.66 -> 36/3.72 -> 42/4.73 -Avoid nephrotoxic medications, contrast dyes, hypotension and dehydration to ensure adequate renal perfusion and will need to renally adjust medications -Renal ultrasound 8/12 significant for suggested medical renal disease with mild increased cortical echogenicity of the kidneys -Strict I/O's and Daily Weights; he is - 8.614 mL since admission and his weight is down 20 pounds since admission -Patient's metabolic acidosis  is improved and he has a CO2 of 25, anion gap of 14, chloride level 101 -Nephrology had started the patient on sodium bicarbonate 650 mg p.o. 3 times daily but are now stopping  -IV fluid hydration was ordered for 10 hours however it was continuously running for some reason and this is stopped by the nephrologist and now he will be getting IV Lasix with albumin: Now he is on IV Lasix 80 milligrams twice daily per nephrology and they may transition to 40 mg of torsemide tomorrow if he is improved -UA with ketones and protein, similar to last UA in setting of intractable N/V  -Continue to monitor and trend and repeat CMP in a.m he remains volume overloaded   Type 2 diabetes mellitus with chronic kidney disease, with long-term current use of insulin (HCC) -No anion gap, but he did have ketones in urine and has a slight metabolic acidosis.  -Recent A1C in 8/23 was 7.7 -On 70/30, substitute with semglee decreased dose of 8 units with decreased PO intake  -Has not started his mounjaro -Continue with Sensitive NovoLog SSI and accuchecks AC -CBGs ranging from 106-215   Vertebral Osteomyelitis (HCC) -No change in back pain, no leukocytosis, normal lactic acid -MRI during 8/9 admit with no evidence of recurrent OM -Continue pain control as he continues to have some spasms -Off antibiotics since April -Need close ID follow-up and may discuss with  them but he appears to have no Leukocytosis but he continues to have the spasms and may need reimaging as an outpatient   Anemia of Chronic Kidney Disease -Likely had a dilutional drop as patient's hemoglobin/hematocrit went from 8.8/24.0 -> 7.1/21.5 -> 7.2/21.5 -> 7.5/22.0 -> 8.1/23.7 and is now 7.0/20.3 yesterday and he was given 1 unit of PRBCs.  Hemoglobin/hematocrit is improved after that and repeat after was 8.7/24.5 and this morning was 9.7/27.9 -Check Anemia Panel and it showed an iron level of 56, U IBC 190, TIBC 246, saturation greater than 23%,  ferritin level 110 -Received a dose of Aranesp -He had slight dizziness yesterday and could have been in the setting of low hemoglobin -Continue to monitor for signs and symptoms of bleeding; no overt bleeding noted -Repeat CBC in a.m.   GAD (generalized anxiety disorder) -Continue Paroxetine 20 mg p.o. nightly, Mirtazapine 50 mg p.o. nightly and Hydroxyzine 25 mg p.o. 3 times daily as needed anxiety nausea   Essential Hypertension -Normotensive-mildly elevated, but improved from HTN urgency on prior admit and he has not taken any of his medication  -Continue home medication of Amlodipine 10mg  daily, Hydralazine 50mg  TID and Metoprolol 75mg  BID and will Titrate as needed  -Continue monitor blood pressures per protocol and last blood pressure reading was 146/77   Hyperlipidemia -Continue Atorvastatin 40 mg po Daily     Hypoalbuminemia -Patient's albumin level went from 3.2  -> 2.7 -> 3.3 -Continue To monitor and trend and repeat CMP in the a.m.   Obesity -Complicates overall prognosis and care -Estimated body mass index is 29.58 kg/m as calculated from the following:   Height as of this encounter: 5\' 11"  (1.803 m).   Weight as of this encounter: 96.2 kg.  -Weight Loss and Dietary Counseling given  DVT prophylaxis: heparin injection 5,000 Units Start: 06/27/22 1445 Place TED hose Start: 06/27/22 1433    Code Status: Full Code Family Communication: No family currently at bedside  Disposition Plan:  Level of care: Med-Surg Status is: Inpatient Remains inpatient appropriate because: Still being diuresed per nephrology given his volume overload and received a unit of blood yesterday.  We will continue PT OT to further evaluate and treat: Need nephrology clearance prior to safe discharge disposition.  GI has signed off the case.   Consultants:  Gastroenterology Nephrology  Procedures:  As delineated as above   Antimicrobials:  Anti-infectives (From admission, onward)     None       Subjective: Seen and examined at bedside and thinks he is doing much better and states that his nausea is fairly well controlled.  He states that he is urinating quite frequently and feels much better today.  States that he is lost about 20 pounds since admission and still has some soreness on his sides but thinks this does not feel as bad and he definitely feels a difference with the Lasix.  No other concerns or complaints at this time.  Objective: Vitals:   07/03/22 0445 07/03/22 0814 07/03/22 1051 07/03/22 1520  BP: (!) 164/81 (!) 167/80  (!) 146/77  Pulse: 67 74  69  Resp:  18  16  Temp: 97.9 F (36.6 C) 98.3 F (36.8 C)  97.9 F (36.6 C)  TempSrc:  Oral    SpO2: 97% 97%  93%  Weight:   96.2 kg   Height:        Intake/Output Summary (Last 24 hours) at 07/03/2022 1527 Last data filed at 07/03/2022 1000 Gross per  24 hour  Intake 684 ml  Output 5300 ml  Net -4616 ml   Filed Weights   06/27/22 1921 06/30/22 0547 07/03/22 1051  Weight: 100.8 kg 103.7 kg 96.2 kg   Examination: Physical Exam:  Constitutional: WN/WD obese Caucasian male currently no acute distress appears calm Respiratory: Diminished to auscultation bilaterally, no wheezing, rales, rhonchi or crackles. Normal respiratory effort and patient is not tachypenic. No accessory muscle use.  Unlabored breathing Cardiovascular: RRR, no murmurs / rubs / gallops. S1 and S2 auscultated.  Has 1+ lower extremity edema Abdomen: Soft, non-tender, distended secondary body habitus. Bowel sounds positive.  GU: Deferred. Musculoskeletal: No clubbing / cyanosis of digits/nails. No joint deformity upper and lower extremities.  Skin: No rashes, lesions, ulcers. No induration; Warm and dry.  Neurologic: CN 2-12 grossly intact with no focal deficits. Romberg sign and  cerebellar reflexes not assessed.  Psychiatric: Normal judgment and insight. Alert and oriented x 3. Normal mood and appropriate affect.   Data Reviewed: I  have personally reviewed following labs and imaging studies  CBC: Recent Labs  Lab 06/29/22 0115 06/30/22 0148 07/01/22 0902 07/02/22 0032 07/02/22 2205 07/03/22 1007  WBC 6.5 5.6 5.9 5.2  --  8.0  NEUTROABS 3.2 2.6 3.3 2.5  --  5.3  HGB 7.2* 7.5* 8.1* 7.0* 8.7* 9.7*  HCT 21.5* 22.0* 23.7* 20.3* 24.5* 27.9*  MCV 90.3 90.5 89.4 88.6  --  87.7  PLT 176 178 177 172  --  884   Basic Metabolic Panel: Recent Labs  Lab 06/29/22 0115 06/30/22 0148 07/01/22 0902 07/02/22 0032 07/03/22 1007  NA 140 141 140 140 140  K 4.4 4.8 4.9 4.7 4.6  CL 112* 113* 110 107 101  CO2 21* 23 21* 25 25  GLUCOSE 118* 140* 126* 153* 248*  BUN 42* 37* 36* 39* 42*  CREATININE 3.74* 3.66* 3.72* 3.72* 3.73*  CALCIUM 8.3* 8.6* 8.7* 8.4* 8.9  MG 2.0 1.9 1.9 1.8 1.7  PHOS 4.1 4.5 4.7* 4.4 4.1   GFR: Estimated Creatinine Clearance: 28.7 mL/min (A) (by C-G formula based on SCr of 3.73 mg/dL (H)). Liver Function Tests: Recent Labs  Lab 06/29/22 0115 06/30/22 0148 07/01/22 0902 07/02/22 0032 07/03/22 1007  AST 10* 12* 14* 11* 16  ALT 17 17 14 13 13   ALKPHOS 126 127* 124 106 116  BILITOT 0.4 0.5 0.5 0.9 0.6  PROT 5.2* 5.6* 6.0* 5.1* 6.1*  ALBUMIN 2.8* 3.0* 3.2* 2.7* 3.3*   Recent Labs  Lab 06/27/22 0824  LIPASE 32   No results for input(s): "AMMONIA" in the last 168 hours. Coagulation Profile: Recent Labs  Lab 06/27/22 0824  INR 1.1   Cardiac Enzymes: No results for input(s): "CKTOTAL", "CKMB", "CKMBINDEX", "TROPONINI" in the last 168 hours. BNP (last 3 results) No results for input(s): "PROBNP" in the last 8760 hours. HbA1C: No results for input(s): "HGBA1C" in the last 72 hours. CBG: Recent Labs  Lab 07/02/22 1614 07/02/22 2007 07/03/22 0816 07/03/22 1037 07/03/22 1144  GLUCAP 106* 156* 160* 215* 145*   Lipid Profile: No results for input(s): "CHOL", "HDL", "LDLCALC", "TRIG", "CHOLHDL", "LDLDIRECT" in the last 72 hours. Thyroid Function Tests: No results for input(s):  "TSH", "T4TOTAL", "FREET4", "T3FREE", "THYROIDAB" in the last 72 hours. Anemia Panel: No results for input(s): "VITAMINB12", "FOLATE", "FERRITIN", "TIBC", "IRON", "RETICCTPCT" in the last 72 hours. Sepsis Labs: Recent Labs  Lab 06/27/22 0924 06/27/22 1921  LATICACIDVEN 0.7 0.9    Recent Results (from the past 240 hour(s))  SARS  Coronavirus 2 by RT PCR (hospital order, performed in [facility] hospital lab) *cepheid single result test* Peripheral     Status: None   Collection Time: 06/27/22  9:03 AM   Specimen: Peripheral; Nasal Swab  Result Value Ref Range Status   SARS Coronavirus 2 by RT PCR NEGATIVE NEGATIVE Final    Comment: (NOTE) SARS-CoV-2 target nucleic acids are NOT DETECTED.  The SARS-CoV-2 RNA is generally detectable in upper and lower respiratory specimens during the acute phase of infection. The lowest concentration of SARS-CoV-2 viral copies this assay can detect is 250 copies / mL. A negative result does not preclude SARS-CoV-2 infection and should not be used as the sole basis for treatment or other patient management decisions.  A negative result may occur with improper specimen collection / handling, submission of specimen other than nasopharyngeal swab, presence of viral mutation(s) within the areas targeted by this assay, and inadequate number of viral copies (<250 copies / mL). A negative result must be combined with clinical observations, patient history, and epidemiological information.  Fact Sheet for Patients:   https://www.patel.info/  Fact Sheet for Healthcare Providers: https://hall.com/  This test is not yet approved or  cleared by the [place] FDA and has been authorized for detection and/or diagnosis of SARS-CoV-2 by FDA under an Emergency Use Authorization (EUA).  This EUA will remain in effect (meaning this test can be used) for the duration of the COVID-19 declaration under Section 564(b)(1) of  the Act, 21 U.S.C. section 360bbb-3(b)(1), unless the authorization is terminated or revoked sooner.  Performed at [facility], [place] [address] [place], [place] [address]   Blood Culture (routine x 2)     Status: None   Collection Time: 06/27/22  9:08 AM   Specimen: BLOOD RIGHT WRIST  Result Value Ref Range Status   Specimen Description BLOOD RIGHT WRIST  Final   Special Requests   Final    BOTTLES DRAWN AEROBIC AND ANAEROBIC Blood Culture results may not be optimal due to an inadequate volume of blood received in culture bottles   Culture   Final    NO GROWTH 5 DAYS Performed at [facility], [place] [address], [place], [place] [address]    Report Status 07/02/2022 FINAL  Final  Blood Culture (routine x 2)     Status: None   Collection Time: 06/27/22  9:18 AM   Specimen: BLOOD LEFT FOREARM  Result Value Ref Range Status   Specimen Description BLOOD LEFT FOREARM  Final   Special Requests   Final    BOTTLES DRAWN AEROBIC AND ANAEROBIC Blood Culture results may not be optimal due to an inadequate volume of blood received in culture bottles   Culture   Final    NO GROWTH 5 DAYS Performed at [facility], [place] [address] [place], [place] [address]    Report Status 07/02/2022 FINAL  Final    Radiology Studies: No results found.  Scheduled Meds:  amLODipine  10 mg Oral Daily   atorvastatin  40 mg Oral Daily   darbepoetin (ARANESP) injection - NON-DIALYSIS  40 mcg Subcutaneous Q Wed-1800   gabapentin  200 mg Oral BID   heparin  5,000 Units Subcutaneous Q8H   insulin aspart  0-9 Units Subcutaneous TID WC   insulin glargine-yfgn  8 Units Subcutaneous QHS   iron polysaccharides  150 mg Oral Daily   metoprolol tartrate  75 mg Oral BID   mirtazapine  15 mg Oral QHS  pantoprazole  40 mg Oral Daily   PARoxetine  20 mg Oral QHS   tamsulosin  0.4 mg Oral QPC supper   [START ON 07/04/2022] torsemide  40 mg Oral Daily   Continuous Infusions:   LOS: 4 days   Raiford Noble, DO [facility] Available via Epic secure chat 7am-7pm After these hours, please refer to coverage provider listed on [email or web address] 07/03/2022, 3:27 PM

## 2022-07-03 NOTE — Evaluation (Signed)
Occupational Therapy Treatment Patient Details Name: Jose [name] MRN: [id number] DOB: 1976/07/13 Today's Date: 07/03/2022   History of present illness Pt is a 46 y.o. male admitted 9/3 with nausea, vomiting and diarrhea. 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 PMH: CKD, DM2, HTN, prior thoracic osteomyelitis   OT comments  Jose [name] was evaluated s/p the above admission list, he is typically mod I at baseline but reports recent falls. Upon evaluation pt was limited by RLE weakness, back pain and decreased activity tolerance. Overall he required supervision A and RW for all OOB functional tasks for safety only. No LOB noted, and pt has great insight into safety and his deficits. He will benefit from OT acutely, however no need identified for HHOT. Recommend d/c to home with assist from family as needed.    Recommendations for follow up therapy are one component of a multi-disciplinary discharge planning process, led by the attending physician.  Recommendations may be updated based on patient status, additional functional criteria and insurance authorization.    Follow Up Recommendations  No OT follow up    Assistance Recommended at Discharge Intermittent Supervision/Assistance  Patient can return home with the following  A little help with walking and/or transfers;A little help with bathing/dressing/bathroom;Assistance with cooking/housework;Assist for transportation;Help with stairs or ramp for entrance   Equipment Recommendations  None recommended by OT       Precautions / Restrictions Precautions Precautions: Fall Restrictions Weight Bearing Restrictions: No       Mobility Bed Mobility Overal bed mobility: Modified Independent                  Transfers Overall transfer level: Needs assistance Equipment used: Rolling walker (2 wheels) Transfers: Sit to/from Stand Sit to Stand: Supervision                 Balance Overall balance  assessment: Needs assistance Sitting-balance support: Feet supported, No upper extremity supported Sitting balance-Leahy Scale: Good     Standing balance support: Single extremity supported, During functional activity Standing balance-Leahy Scale: Fair                             ADL either performed or assessed with clinical judgement   ADL Overall ADL's : Needs assistance/impaired Eating/Feeding: Independent   Grooming: Supervision/safety;Standing   Upper Body Bathing: Set up;Sitting   Lower Body Bathing: Supervison/ safety;Sit to/from stand   Upper Body Dressing : Set up;Sitting   Lower Body Dressing: Supervision/safety;Sit to/from stand   Toilet Transfer: Supervision/safety;Rolling walker (2 wheels) Toilet Transfer Details (indicate cue type and reason): ambulated to bathroom, voided in standing Toileting- Clothing Manipulation and Hygiene: Supervision/safety       Functional mobility during ADLs: Supervision/safety;Rolling walker (2 wheels) General ADL Comments: supervision for safety, no LOB    Extremity/Trunk Assessment Upper Extremity Assessment Upper Extremity Assessment: Overall WFL for tasks assessed   Lower Extremity Assessment Lower Extremity Assessment: Defer to PT evaluation RLE Deficits / Details: LE weakness R > L. Pt reports being fitted for flexible AFO at rehab but never received. RLE Sensation: decreased light touch LLE Deficits / Details: LE weakness R > L LLE Sensation: decreased light touch   Cervical / Trunk Assessment Cervical / Trunk Assessment: Other exceptions Cervical / Trunk Exceptions: h/o spinal surgery    Vision Baseline Vision/History: 5 Retinopathy Additional Comments: diabetic ret at baseline   Perception     Praxis      Cognition Arousal/Alertness:  Awake/alert Behavior During Therapy: WFL for tasks assessed/performed Overall Cognitive Status: Within Functional Limits for tasks assessed                                           Exercises      Shoulder Instructions       General Comments VSS on RA    Pertinent Vitals/ Pain       Pain Assessment Pain Assessment: Faces Faces Pain Scale: Hurts a little bit Pain Location: back Pain Descriptors / Indicators: Discomfort Pain Intervention(s): Monitored during session  Home Living Family/patient expects to be discharged to:: Private residence Living Arrangements: Spouse/significant other Available Help at Discharge: Family;Available 24 hours/day Type of Home: House Home Access: Stairs to enter [employer] of Steps: 3 Entrance Stairs-Rails: None Home Layout: One level     Bathroom Shower/Tub: Teacher, early years/pre: Standard Bathroom Accessibility: Yes How Accessible: Accessible via walker Home Equipment: Cane - single point;Shower [place] (2 wheels)   Additional Comments: pt reports frequent falls, but does not use his RW, uses cane      Prior Functioning/Environment              Frequency  Min 2X/week        Progress Toward Goals  OT Goals(current goals can now be found in the care plan section)     Acute Rehab OT Goals Patient Stated Goal: home soon OT Goal Formulation: With patient Time For Goal Achievement: 06/19/22 Potential to Achieve Goals: Good ADL Goals Pt Will Perform Grooming: Independently;standing Pt Will Perform Lower Body Bathing: Independently;sit to/from stand Pt Will Perform Lower Body Dressing: Independently;sit to/from stand Pt Will Transfer to Toilet: with modified independence;ambulating;regular height toilet  Plan      Co-evaluation                 AM-PAC OT "6 Clicks" Daily Activity     Outcome Measure   Help from another person eating meals?: None Help from another person taking care of personal grooming?: A Little Help from another person toileting, which includes using toliet, bedpan, or urinal?: A Little Help from  another person bathing (including washing, rinsing, drying)?: A Little Help from another person to put on and taking off regular upper body clothing?: None Help from another person to put on and taking off regular lower body clothing?: A Little 6 Click Score: 20    End of Session Equipment Utilized During Treatment: Rolling walker (2 wheels)  OT Visit Diagnosis: Unsteadiness on feet (R26.81);Muscle weakness (generalized) (M62.81);Repeated falls (R29.6);History of falling (Z91.81)   Activity Tolerance Patient tolerated treatment well   Patient Left in bed;with call bell/phone within reach;with bed alarm set   Nurse Communication Mobility status        Time: 2419-9144 OT Time Calculation (min): 13 min  Charges: OT General Charges $OT Visit: 1 Visit OT Evaluation $OT Eval Low Complexity: 1 Low    Bina Veenstra A Keyaira Clapham 07/03/2022, 2:23 PM

## 2022-07-03 NOTE — Evaluation (Signed)
Physical Therapy Evaluation Patient Details Name: Jose [name] MRN: [id number] DOB: 02/09/1976 Today's Date: 07/03/2022  History of Present Illness  Pt is a 46 y.o. male admitted 9/3 with nausea, vomiting and diarrhea. 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 PMH: CKD, DM2, HTN, prior thoracic osteomyelitis   Clinical Impression  Pt admitted with above diagnosis. PTA pt lived at home with family, mod I mobility/ADLs with cane. Pt currently with functional limitations due to the deficits listed below (see PT Problem List). On eval, pt required supervision transfers, and min guard assist ambulation 175' with RW. Steppage gait noted with decreased foot clearance on R. Pt instructed in distal LE strengthening exercises. Pt will benefit from skilled PT to increase their independence and safety with mobility to allow discharge to the venue listed below.          Recommendations for follow up therapy are one component of a multi-disciplinary discharge planning process, led by the attending physician.  Recommendations may be updated based on patient status, additional functional criteria and insurance authorization.  Follow Up Recommendations Home health PT      Assistance Recommended at Discharge PRN  Patient can return home with the following       Equipment Recommendations None recommended by PT  Recommendations for Other Services       Functional Status Assessment Patient has had a recent decline in their functional status and demonstrates the ability to make significant improvements in function in a reasonable and predictable amount of time.     Precautions / Restrictions Precautions Precautions: Fall      Mobility  Bed Mobility Overal bed mobility: Modified Independent                  Transfers Overall transfer level: Needs assistance Equipment used: Rolling walker (2 wheels) Transfers: Sit to/from Stand Sit to Stand: Supervision Stand  pivot transfers: Supervision         General transfer comment: increased time to power up    Ambulation/Gait Ambulation/Gait assistance: Min guard Gait Distance (Feet): 175 Feet Assistive device: Rolling walker (2 wheels) Gait Pattern/deviations: Step-through pattern, Decreased stance time - right, Decreased dorsiflexion - right, Decreased step length - right, Steppage Gait velocity: decreased Gait velocity interpretation: <1.8 ft/sec, indicate of risk for recurrent falls   General Gait Details: cues for posture, heel strike, and toe off  Stairs            Wheelchair Mobility    Modified Rankin (Stroke Patients Only)       Balance Overall balance assessment: Needs assistance Sitting-balance support: Feet supported, No upper extremity supported Sitting balance-Leahy Scale: Good     Standing balance support: Bilateral upper extremity supported, During functional activity, Reliant on assistive device for balance Standing balance-Leahy Scale: Poor                               Pertinent Vitals/Pain Pain Assessment Pain Assessment: 0-10 Pain Score: 5  Pain Location: back Pain Descriptors / Indicators: Discomfort Pain Intervention(s): Monitored during session, Premedicated before session    Home Living Family/patient expects to be discharged to:: Private residence Living Arrangements: Spouse/significant other Available Help at Discharge: Family;Available 24 hours/day Type of Home: House Home Access: Stairs to enter Entrance Stairs-Rails: None Entrance Stairs-Number of Steps: 3   Home Layout: One level Home Equipment: Cane - single point;Shower seat;Rolling Environmental consultant (2 wheels)      Prior Function Prior Level  of Function : Independent/Modified Independent;Working/employed;Driving             Mobility Comments: cane for mobility ADLs Comments: ind with ADLs, 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 Pt reports being fitted for flexible AFO at rehab but never received. RLE Sensation: decreased light touch LLE Deficits / Details: LE weakness R > L LLE Sensation: decreased light touch    Cervical / Trunk Assessment Cervical / Trunk Assessment: Other exceptions Cervical / Trunk Exceptions: h/o spinal surgery  Communication   Communication: No difficulties  Cognition Arousal/Alertness: Awake/alert Behavior During Therapy: WFL for tasks assessed/performed Overall Cognitive Status: Within Functional Limits for tasks assessed                                          General Comments General comments (skin integrity, edema, etc.): VSS on RA    Exercises Other Exercises Other Exercises: distal LE strengthening exercises sitting EOB: moving towels around with forefoot while heel stabilized   Assessment/Plan    PT Assessment Patient needs continued PT services  PT Problem List Decreased strength;Decreased activity tolerance;Decreased balance;Decreased mobility;Decreased knowledge of use of DME;Impaired sensation;Pain       PT Treatment Interventions DME instruction;Stair training;Gait training;Functional mobility training;Therapeutic exercise;Therapeutic activities;Balance training;Neuromuscular re-education;Patient/family education    PT Goals (Current goals can be found in the Care Plan section)  Acute Rehab PT Goals Patient Stated Goal: regain mobility PT Goal Formulation: With patient Time For Goal Achievement: 07/17/22 Potential to Achieve Goals: Good    Frequency Min 3X/week     Co-evaluation               AM-PAC PT "6 Clicks" Mobility  Outcome Measure Help  needed turning from your back to your side while in a flat bed without using bedrails?: None Help needed moving from lying on your back to sitting on the side of a flat bed without using bedrails?: None Help needed moving to and from a bed to a chair (including a wheelchair)?: A Little Help needed standing up from a chair using your arms (e.g., wheelchair or bedside chair)?: A Little Help needed to walk in hospital room?: A Little Help needed climbing 3-5 steps with a railing? : A Little 6 Click Score: 20    End of Session Equipment Utilized During Treatment: Gait belt Activity Tolerance: Patient tolerated treatment well Patient left: in bed;with call bell/phone within reach Nurse Communication: Mobility status PT Visit Diagnosis: Unsteadiness on feet (R26.81);Repeated falls (R29.6);Muscle weakness (generalized) (M62.81);Difficulty in walking, not elsewhere classified (R26.2);Pain    Time: 8638-1771 PT Time Calculation (min) (ACUTE ONLY): 25 min   Charges:   PT Evaluation $PT Eval Moderate Complexity: 1 Mod PT Treatments $Gait Training: 8-22 mins        Lorrin Goodell, PT  Office # [phone] Pager [phone]   Lorriane Shire 07/03/2022, 11:10 AM

## 2022-07-03 NOTE — Plan of Care (Signed)

## 2022-07-03 NOTE — Progress Notes (Signed)
[facility] Progress Note  Name: Jose [name] MRN: [id number] DOB: [birth year]  Chief Complaint:  N/v and diarrhea  Subjective:  He had 3.4 liters UOP over 9/8.  Daily weights are not available.  He got PRBC on 9/8.  He feels better today.  He has ambulated in the hall and still had some dizziness.  He thinks his appt was this past week so I let him know we'll reschedule.  Still feels like a lot of fluid is around his abdomen  Review of systems:   Reports shortness of breath with exertion but this is much better than it was Denies chest pain  Still has some nausea   Vision is better  ---------------- Background on referral:  TAL KEMPKER is a 46 y.o. male with a history of CKD, diabetes, hypertension, prior thoracic osteomyelitis who presented to the hospital with nausea with vomiting and diarrhea.  His creatinine was noted to be elevated at 4.01.  Creatinine history is below.  From mid August to present his creatinine has been 4.  Earlier this year creatinine between 2.1 and 2.48.  He has followed with Dr. Hollie Salk at our office.  He received fluids initially and these were stopped.  Suspect ins/outs not fully captured but had 650 ml UOP over 9/4 documented.  Nephrology is consulted for assistance with management.  He recently saw Dr. Hollie Salk on 8/31 for hospital follow-up.  On 06/24/22 his Cr was 3.61, BUN 42, eGFR 20, K 5.6, bicarb 17.  She wasn't sure how much recovery to really expect.  She had started a taper off minoxidil and had started torsemide 40 mg daily.  He states he really only took the torsemide for a day (because he later had n/v and wasn't able to keep it down).  He's not completely off the minodixil; he had been on it for a few weeks.  Diarrhea is a little better.  N/v is better but he states has gotten lots of PRN's. He does feel like he's having trouble getting a deep breath and tightness in his legs.  He was ordered fluids for 10 hours; these expired 13 hours or  so ago and are still running.  New RN has just picked up his care and is at bedside.  She turned the fluids off.  His mother and brother have had kidney disease; his brother's was after a heart attack.       Intake/Output Summary (Last 24 hours) at 07/03/2022 1006 Last data filed at 07/03/2022 0851 Gross per 24 hour  Intake 684 ml  Output 3400 ml  Net -2716 ml    Vitals:  Vitals:   07/02/22 2004 07/02/22 2046 07/03/22 0445 07/03/22 0814  BP: (!) 158/77 (!) 162/85 (!) 164/81 (!) 167/80  Pulse: 75 77 67 74  Resp: 16 18  18   Temp: 98.3 F (36.8 C) 98.7 F (37.1 C) 97.9 F (36.6 C) 98.3 F (36.8 C)  TempSrc: Oral Oral  Oral  SpO2: 100% 97% 97% 97%  Weight:      Height:         Physical Exam:    General: adult male in bed in NAD HEENT: NCAT Eyes: EOMI sclera anicteric Neck: supple trachea midline  Heart: S1S2 no rub Lungs: clear to auscultation; unlabored at rest on room air Abdomen: soft/nt/nd/obese habitus; abd wall edema Extremities: 1-2+ edema; compression stockings on  Skin: no rash on extremities exposed Psych normal mood and affect Neuro:  alert and oriented x 3  provides hx and follows commands  Medications reviewed   Labs:     Latest Ref Rng & Units 07/02/2022   12:32 AM 07/01/2022    9:02 AM 06/30/2022    1:48 AM  BMP  Glucose 70 - 99 mg/dL 153  126  140   BUN 6 - 20 mg/dL 39  36  37   Creatinine 0.61 - 1.24 mg/dL 3.72  3.72  3.66   Sodium 135 - 145 mmol/L 140  140  141   Potassium 3.5 - 5.1 mmol/L 4.7  4.9  4.8   Chloride 98 - 111 mmol/L 107  110  113   CO2 22 - 32 mmol/L 25  21  23    Calcium 8.9 - 10.3 mg/dL 8.4  8.7  8.6      Assessment/Plan:   # CKD stage IV   - Likely 2/2 DM and HTN.  Setting of overload  - Continue Lasix 80 mg IV BID today - Will plan to transition to PO tomorrow (try torsemide 40 mg daily) - await today's labs - please limit gabapentin to no more than 300 mg over the course of a day given his CKD  - daily weights are ordered and  still not available   # HTN  - optimize volume status as above  - continue other current regimen   # Metabolic acidosis - From Diarrhea and CKD - now off of sodium bicarb   # Diarrhea  - covid was negative - work-up per primary team  - improved    # Normocytic anemia - Suspect contribution from his CKD.  Mild iron deficiency - defer IV iron in favor of PO - Started aranesp 40 mcg weekly on Wednesdays and may need to increase if remains inpatient - s/p 1 unit of PRBC's on 9/8   # DM type 2  - per primary team     # Chills  - recent osteo - work-up per primary team    Disposition - would continue inpatient monitoring and IV diuresis. Hopeful for the transition to PO diuretics as early as tomorrow.  For planning purposes, I will ensure that he has follow-up with Dr. Hollie Salk or extender clinic at our office 1-2 weeks after discharge   Claudia Desanctis, MD 07/03/2022 10:26 AM

## 2022-07-04 DIAGNOSIS — D649 Anemia, unspecified: Secondary | ICD-10-CM | POA: Diagnosis not present

## 2022-07-04 DIAGNOSIS — N179 Acute kidney failure, unspecified: Secondary | ICD-10-CM | POA: Diagnosis not present

## 2022-07-04 DIAGNOSIS — I10 Essential (primary) hypertension: Secondary | ICD-10-CM | POA: Diagnosis not present

## 2022-07-04 DIAGNOSIS — R112 Nausea with vomiting, unspecified: Secondary | ICD-10-CM | POA: Diagnosis not present

## 2022-07-04 LAB — CBC WITH DIFFERENTIAL/PLATELET
Abs Immature Granulocytes: 0.04 10*3/uL (ref 0.00–0.07)
Basophils Absolute: 0 10*3/uL (ref 0.0–0.1)
Basophils Relative: 1 %
Eosinophils Absolute: 0.3 10*3/uL (ref 0.0–0.5)
Eosinophils Relative: 5 %
HCT: 25.9 % — ABNORMAL LOW (ref 39.0–52.0)
Hemoglobin: 8.9 g/dL — ABNORMAL LOW (ref 13.0–17.0)
Immature Granulocytes: 1 %
Lymphocytes Relative: 31 %
Lymphs Abs: 2.2 10*3/uL (ref 0.7–4.0)
MCH: 29.9 pg (ref 26.0–34.0)
MCHC: 34.4 g/dL (ref 30.0–36.0)
MCV: 86.9 fL (ref 80.0–100.0)
Monocytes Absolute: 0.9 10*3/uL (ref 0.1–1.0)
Monocytes Relative: 13 %
Neutro Abs: 3.5 10*3/uL (ref 1.7–7.7)
Neutrophils Relative %: 49 %
Platelets: 221 10*3/uL (ref 150–400)
RBC: 2.98 MIL/uL — ABNORMAL LOW (ref 4.22–5.81)
RDW: 12.3 % (ref 11.5–15.5)
WBC: 7 10*3/uL (ref 4.0–10.5)
nRBC: 0 % (ref 0.0–0.2)

## 2022-07-04 LAB — COMPREHENSIVE METABOLIC PANEL
ALT: 11 U/L (ref 0–44)
AST: 14 U/L — ABNORMAL LOW (ref 15–41)
Albumin: 2.9 g/dL — ABNORMAL LOW (ref 3.5–5.0)
Alkaline Phosphatase: 115 U/L (ref 38–126)
Anion gap: 10 (ref 5–15)
BUN: 47 mg/dL — ABNORMAL HIGH (ref 6–20)
CO2: 29 mmol/L (ref 22–32)
Calcium: 8.6 mg/dL — ABNORMAL LOW (ref 8.9–10.3)
Chloride: 101 mmol/L (ref 98–111)
Creatinine, Ser: 3.9 mg/dL — ABNORMAL HIGH (ref 0.61–1.24)
GFR, Estimated: 18 mL/min — ABNORMAL LOW (ref >60)
Glucose, Bld: 153 mg/dL — ABNORMAL HIGH (ref 70–99)
Potassium: 4.2 mmol/L (ref 3.5–5.1)
Sodium: 140 mmol/L (ref 135–145)
Total Bilirubin: 0.3 mg/dL (ref 0.3–1.2)
Total Protein: 5.5 g/dL — ABNORMAL LOW (ref 6.5–8.1)

## 2022-07-04 LAB — GLUCOSE, CAPILLARY
Glucose-Capillary: 179 mg/dL — ABNORMAL HIGH (ref 70–99)
Glucose-Capillary: 149 mg/dL — ABNORMAL HIGH (ref 70–99)
Glucose-Capillary: 125 mg/dL — ABNORMAL HIGH (ref 70–99)
Glucose-Capillary: 182 mg/dL — ABNORMAL HIGH (ref 70–99)

## 2022-07-04 LAB — MAGNESIUM: Magnesium: 1.7 mg/dL (ref 1.7–2.4)

## 2022-07-04 LAB — PHOSPHORUS: Phosphorus: 3.9 mg/dL (ref 2.5–4.6)

## 2022-07-04 NOTE — Plan of Care (Signed)

## 2022-07-04 NOTE — Progress Notes (Signed)
PROGRESS NOTE    Jose [name]  CZY:[id number] DOB: 05-21-1976 DOA: 06/27/2022 PCP: Madelon Lips, MD   Brief Narrative:  The patient is a obese Caucasian male with a past medical history significant for but not limited to hypertension, hyperlipidemia, diabetes mellitus type 2, history of chronic kidney disease stage IV, history of thoracic osteomyelitis/discitis as well as other comorbidities who presented to the ED with complaints of intractable nausea and vomiting with associated loose bowel movements and diarrhea as well.  He states that he has noticed that he may continue to retain some fluid since he got discharged in the hospital last.  Denies any sick contacts and had some mild abdominal pain but is exercising.  Also has pain and back pain and shaking and has spasms in his back pain is not changed from his prior visit.  He denies fevers at home but had more core temperature of 99.5 yesterday He was in bed with covers.  He has been nauseous.  A lot last time and has not really thrown up to 4-13 times and has used Zofran at home which has helped but he has not been able to eat anything.  Is resting admitted for nausea vomiting AKI on CKD stage IV from 8/9 to 06/08/2022.  He was presented with SIRS criteria and concern for recurrent osteomyelitis infection.  Imaging at that time showed no acute changes and ID neurosurgery consulted and blood cultures remain negative.  There were no concerns for recurrent osteomyelitis at that time and he does have hypertensive urgency and blood pressure medications were adjusted.  Currently he denies any fevers but has had chills and headaches as well as leg swelling.  He was seen by his nephrologist on Thursday and placed on a diuretic of torsemide.  In the ED he was given 500 mill bolus as well as placed on maintenance IV fluids at 75 mils per hour for 10 hours.  We will remove this continue to monitor his renal function carefully given that he continues have  intractable nausea vomiting.  CT scan of the abdomen pelvis was done and showed "There is no evidence of intestinal obstruction or pneumoperitoneum. There is no hydronephrosis.   There is liquid stool in the lumen of colon and rectum. This may be a normal variation or suggest  nonspecific enterocolitis. There is no significant wall thickening or pericolic stranding. Gallbladder stones. Small hiatal hernia. Small bilateral pleural effusions. There are small patchy infiltrates in both lower lung fields suggesting atelectasis/pneumonia.  cardiomegaly. Coronary artery calcifications are seen."   Patient is diarrhea is improving however he continues to be significantly nauseous and have some vomiting but this is improving slightly.  Renal function continues to improve a little bit but he appears extremely volume overloaded so nephrology been consulted for assistance.  Given that he continues to be intermittently nauseous and vomited all day yesterday we will consult GI for further evaluation.  His nausea vomiting and diarrhea improving slowly and GI recommends continuing supportive care for now.  They are recommending advancing diet as tolerated and obtain a GI pathogen panel and C. difficile if recurrence of diarrhea occurs.   IV fluids have stopped and nephrology has been evaluating and diuresing the patient daily and have given him a dose of extra Lasix this afternoon again as well as 1 unit of blood given his hemoglobin dropping to 7.  He continues to improve slowly.  Assessment and Plan: * Intractable nausea and vomiting with associated diarrhea, improving significantly -  46 year old with 2 day history intractable N/V/D in setting of progressing CKD -Place in observation telemetry -anti-emetics,slowly advance diet -check stool studies/quick c.diff. no hx of sick contacts. Had brief IV abx during last hospitalization a few weeks ago -Was recently started on a diuretic on Thursday however had intractable  nausea vomiting and this has been held given concern for dehydration and now being resumed by nephrology -Strict I/O's and see below -Hold bethanechol -Continue with antiemetics with p.o./IV Ondansetron 4 mg may need Compazine; his nausea vomiting is improving and continues to have some mild nausea but this is improved -GI has been consulted and feel that he has gastroenteritis and recommends continuing supportive care given that his symptoms are improving and recommending obtaining GI pathogen panel and C. difficile if he has recurrence of diarrhea; he had 1 episode of loose stool the day before yesterday but his vomiting stopped and he continues to be slightly nauseous but improving slowly.  GI is now recommending starting the patient on a PPI and starting pantoprazole 40 mg daily for history of esophagitis and adding Carafate 1 g twice daily for 7 days and avoiding long-term use of Carafate given his chronic kidney disease but will per 07/02/2022 at the recommendations of nephrology recommendations -We will stop the stool studies given that his stools are improved -We will continue Pantoprazole 40 g p.o. daily   Acute kidney injury superimposed on chronic kidney disease Stage IV (HCC) Metabolic Acidosis -Baseline creatinine was around 2-2.5;  -however, during recent hospitalization creatinine remained around 3.9.  -Nephrology was consulted and felt this would be closer to his new baseline.  Patient's BUNs/creatinine has now trended down to 42/3.74 -> 37/3.66 -> 36/3.72 -> 42/3.73 -> 47/3.90 -Avoid nephrotoxic medications, contrast dyes, hypotension and dehydration to ensure adequate renal perfusion and will need to renally adjust medications -Renal ultrasound 8/12 significant for suggested medical renal disease with mild increased cortical echogenicity of the kidneys -Strict I/O's and Daily Weights; he is -12.034 mL since admission and his weight is down 20 pounds since admission and not repeated  today -Patient's metabolic acidosis is improved and he has a CO2 of 29, anion gap of 10, chloride level 101 -Nephrology had started the patient on sodium bicarbonate 650 mg p.o. 3 times daily but are now stopping  -IV fluid hydration was ordered for 10 hours however it was continuously running for some reason and this is stopped by the nephrologist and now he will be getting IV Lasix with albumin: He is off of the IV Lasix 80 mg twice daily and now on p.o. torsemide 40 mg daily and nephrology wanted to observe another day; if he is improved can likely be discharged home tomorrow -UA with ketones and protein, similar to last UA in setting of intractable N/V  -Continue to monitor and trend and repeat CMP in a.m he remains volume overloaded   Type 2 diabetes mellitus with chronic kidney disease, with long-term current use of insulin (HCC) -No anion gap, but he did have ketones in urine and has a slight metabolic acidosis.  -Recent A1C in 8/23 was 7.7 -On 70/30, substitute with semglee decreased dose of 8 units with decreased PO intake  -Has not started his mounjaro -Continue with Sensitive NovoLog SSI and accuchecks AC -CBGs ranging from 145-189   Vertebral Osteomyelitis (HCC) -No change in back pain, no leukocytosis, normal lactic acid -MRI during 8/9 admit with no evidence of recurrent OM -Continue pain control as he continues to have  some spasms -Off antibiotics since April -Need close ID follow-up and may discuss with them but he appears to have no Leukocytosis but he continues to have the spasms and may need reimaging as an outpatient   Anemia of Chronic Kidney Disease -Likely had a dilutional drop as patient's hemoglobin/hematocrit went from 8.8/24.0 -> 7.1/21.5 -> 7.2/21.5 -> 7.5/22.0 -> 8.1/23.7 -> 7.0/20.3 the day before yesterday and he was given 1 unit of PRBCs.  Hemoglobin/hematocrit is improved after that and repeat after was 8.7/24.5 and yesterday morning was 9.7/27.9 -> 8.9/25.9   -Check Anemia Panel and it showed an iron level of 56, U IBC 190, TIBC 246, saturation greater than 23%, ferritin level 110 -Received a dose of Aranesp -He had slight dizziness yesterday and could have been in the setting of low hemoglobin -Continue to monitor for signs and symptoms of bleeding; no overt bleeding noted -Repeat CBC in a.m.   GAD (generalized anxiety disorder) -Continue Paroxetine 20 mg p.o. nightly, Mirtazapine 50 mg p.o. nightly and Hydroxyzine 25 mg p.o. 3 times daily as needed anxiety nausea   Essential Hypertension -Normotensive-mildly elevated, but improved from HTN urgency on prior admit and he has not taken any of his medication  -Continue home medication of Amlodipine 10mg  daily, Hydralazine 50mg  TID and Metoprolol 75mg  BID and will Titrate as needed  -Continue monitor blood pressures per protocol and last blood pressure reading was 160/78   Hyperlipidemia -Continue Atorvastatin 40 mg po Daily     Hypoalbuminemia -Patient's albumin level went from 3.2  -> 2.7 -> 3.3 -> 2.9 -Continue To monitor and trend and repeat CMP in the a.m.   Obesity -Complicates overall prognosis and care -Estimated body mass index is 29.58 kg/m as calculated from the following:   Height as of this encounter: 5\' 11"  (1.803 m).   Weight as of this encounter: 96.2 kg.  -Weight Loss and Dietary Counseling given  DVT prophylaxis: heparin injection 5,000 Units Start: 06/27/22 1445 Place TED hose Start: 06/27/22 1433    Code Status: Full Code Family Communication: No family currently at bedside  Disposition Plan:  Level of care: Med-Surg Status is: Inpatient Remains inpatient appropriate because: Needs further nephrology clearance and anticipating discharging home in the next 24 to 48 hours   Consultants:  Gastroenterology Nephrology  Procedures:  As delineated as above  Antimicrobials:  Anti-infectives (From admission, onward)    None       Subjective: Seen and  examined at bedside and he is sitting at the edge of the bed eating and he feels much better.  Thinks his legs are less swollen.  States his dizziness improved.  No nausea or vomiting.  Thinks he is lost quite a bit of weight and fluid.  No other concerns or complaints this time.  Objective: Vitals:   07/03/22 1520 07/03/22 2100 07/04/22 0426 07/04/22 0924  BP: (!) 146/77 (!) 150/73 134/76 (!) 160/78  Pulse: 69  69 73  Resp: 16  16   Temp: 97.9 F (36.6 C) 98.1 F (36.7 C) 97.8 F (36.6 C)   TempSrc:  Oral Oral Oral  SpO2: 93% 95% 92% 92%  Weight:      Height:        Intake/Output Summary (Last 24 hours) at 07/04/2022 1534 Last data filed at 07/04/2022 1761 Gross per 24 hour  Intake 240 ml  Output 2200 ml  Net -1960 ml   Filed Weights   06/27/22 1921 06/30/22 0547 07/03/22 1051  Weight: 100.8 kg 103.7  kg 96.2 kg   Examination: Physical Exam:  Constitutional: WN/WD obese Caucasian male currently no acute distress appears calm Respiratory: Diminished to auscultation bilaterally with coarse breath sounds, no wheezing, rales, rhonchi or crackles. Normal respiratory effort and patient is not tachypenic. No accessory muscle use.  Unlabored breathing Cardiovascular: RRR, no murmurs / rubs / gallops. S1 and S2 auscultated.  Has 1+ extremity edema Abdomen: Soft, non-tender, distended secondary body habitus. Bowel sounds positive.  GU: Deferred. Musculoskeletal: No clubbing / cyanosis of digits/nails. No joint deformity upper and lower extremities.  Skin: No rashes, lesions, ulcers on limited skin evaluation. No induration; Warm and dry.  Neurologic: CN 2-12 grossly intact with no focal deficits. Romberg sign and cerebellar reflexes not assessed.  Psychiatric: Normal judgment and insight. Alert and oriented x 3. Normal mood and appropriate affect.   Data Reviewed: I have personally reviewed following labs and imaging studies  CBC: Recent Labs  Lab 06/30/22 0148 07/01/22 0902  07/02/22 0032 07/02/22 2205 07/03/22 1007 07/04/22 0036  WBC 5.6 5.9 5.2  --  8.0 7.0  NEUTROABS 2.6 3.3 2.5  --  5.3 3.5  HGB 7.5* 8.1* 7.0* 8.7* 9.7* 8.9*  HCT 22.0* 23.7* 20.3* 24.5* 27.9* 25.9*  MCV 90.5 89.4 88.6  --  87.7 86.9  PLT 178 177 172  --  219 563   Basic Metabolic Panel: Recent Labs  Lab 06/30/22 0148 07/01/22 0902 07/02/22 0032 07/03/22 1007 07/04/22 0036  NA 141 140 140 140 140  K 4.8 4.9 4.7 4.6 4.2  CL 113* 110 107 101 101  CO2 23 21* 25 25 29   GLUCOSE 140* 126* 153* 248* 153*  BUN 37* 36* 39* 42* 47*  CREATININE 3.66* 3.72* 3.72* 3.73* 3.90*  CALCIUM 8.6* 8.7* 8.4* 8.9 8.6*  MG 1.9 1.9 1.8 1.7 1.7  PHOS 4.5 4.7* 4.4 4.1 3.9   GFR: Estimated Creatinine Clearance: 27.4 mL/min (A) (by C-G formula based on SCr of 3.9 mg/dL (H)). Liver Function Tests: Recent Labs  Lab 06/30/22 0148 07/01/22 0902 07/02/22 0032 07/03/22 1007 07/04/22 0036  AST 12* 14* 11* 16 14*  ALT 17 14 13 13 11   ALKPHOS 127* 124 106 116 115  BILITOT 0.5 0.5 0.9 0.6 0.3  PROT 5.6* 6.0* 5.1* 6.1* 5.5*  ALBUMIN 3.0* 3.2* 2.7* 3.3* 2.9*   No results for input(s): "LIPASE", "AMYLASE" in the last 168 hours. No results for input(s): "AMMONIA" in the last 168 hours. Coagulation Profile: No results for input(s): "INR", "PROTIME" in the last 168 hours. Cardiac Enzymes: No results for input(s): "CKTOTAL", "CKMB", "CKMBINDEX", "TROPONINI" in the last 168 hours. BNP (last 3 results) No results for input(s): "PROBNP" in the last 8760 hours. HbA1C: No results for input(s): "HGBA1C" in the last 72 hours. CBG: Recent Labs  Lab 07/03/22 1144 07/03/22 1714 07/03/22 2049 07/04/22 0837 07/04/22 1204  GLUCAP 145* 152* 189* 179* 149*   Lipid Profile: No results for input(s): "CHOL", "HDL", "LDLCALC", "TRIG", "CHOLHDL", "LDLDIRECT" in the last 72 hours. Thyroid Function Tests: No results for input(s): "TSH", "T4TOTAL", "FREET4", "T3FREE", "THYROIDAB" in the last 72 hours. Anemia  Panel: No results for input(s): "VITAMINB12", "FOLATE", "FERRITIN", "TIBC", "IRON", "RETICCTPCT" in the last 72 hours. Sepsis Labs: Recent Labs  Lab 06/27/22 1921  LATICACIDVEN 0.9    Recent Results (from the past 240 hour(s))  SARS Coronavirus 2 by RT PCR (hospital order, performed in [facility] hospital lab) *cepheid single result test* Peripheral     Status: None   Collection Time:  06/27/22  9:03 AM   Specimen: Peripheral; Nasal Swab  Result Value Ref Range Status   SARS Coronavirus 2 by RT PCR NEGATIVE NEGATIVE Final    Comment: (NOTE) SARS-CoV-2 target nucleic acids are NOT DETECTED.  The SARS-CoV-2 RNA is generally detectable in upper and lower respiratory specimens during the acute phase of infection. The lowest concentration of SARS-CoV-2 viral copies this assay can detect is 250 copies / mL. A negative result does not preclude SARS-CoV-2 infection and should not be used as the sole basis for treatment or other patient management decisions.  A negative result may occur with improper specimen collection / handling, submission of specimen other than nasopharyngeal swab, presence of viral mutation(s) within the areas targeted by this assay, and inadequate number of viral copies (<250 copies / mL). A negative result must be combined with clinical observations, patient history, and epidemiological information.  Fact Sheet for Patients:   https://www.patel.info/  Fact Sheet for Healthcare Providers: https://hall.com/  This test is not yet approved or  cleared by the [place] FDA and has been authorized for detection and/or diagnosis of SARS-CoV-2 by FDA under an Emergency Use Authorization (EUA).  This EUA will remain in effect (meaning this test can be used) for the duration of the COVID-19 declaration under Section 564(b)(1) of the Act, 21 U.S.C. section 360bbb-3(b)(1), unless the authorization is terminated or revoked  sooner.  Performed at [facility], [place] [address], [place], [place] [address]   Blood Culture (routine x 2)     Status: None   Collection Time: 06/27/22  9:08 AM   Specimen: BLOOD RIGHT WRIST  Result Value Ref Range Status   Specimen Description BLOOD RIGHT WRIST  Final   Special Requests   Final    BOTTLES DRAWN AEROBIC AND ANAEROBIC Blood Culture results may not be optimal due to an inadequate volume of blood received in culture bottles   Culture   Final    NO GROWTH 5 DAYS Performed at [facility], [place] [address], [place], [place] [address]    Report Status 07/02/2022 FINAL  Final  Blood Culture (routine x 2)     Status: None   Collection Time: 06/27/22  9:18 AM   Specimen: BLOOD LEFT FOREARM  Result Value Ref Range Status   Specimen Description BLOOD LEFT FOREARM  Final   Special Requests   Final    BOTTLES DRAWN AEROBIC AND ANAEROBIC Blood Culture results may not be optimal due to an inadequate volume of blood received in culture bottles   Culture   Final    NO GROWTH 5 DAYS Performed at [facility], [place] [address] [place], [place] [address]    Report Status 07/02/2022 FINAL  Final    Radiology Studies: No results found.  Scheduled Meds:  amLODipine  10 mg Oral Daily   atorvastatin  40 mg Oral Daily   darbepoetin (ARANESP) injection - NON-DIALYSIS  40 mcg Subcutaneous Q Wed-1800   gabapentin  200 mg Oral BID   heparin  5,000 Units Subcutaneous Q8H   insulin aspart  0-9 Units Subcutaneous TID WC   insulin glargine-yfgn  8 Units Subcutaneous QHS   iron polysaccharides  150 mg Oral Daily   metoprolol tartrate  75 mg Oral BID   mirtazapine  15 mg Oral QHS   pantoprazole  40 mg Oral Daily   PARoxetine  20 mg Oral QHS   tamsulosin  0.4 mg Oral QPC supper   torsemide  40 mg Oral Daily   Continuous Infusions:   LOS: 5 days   Raiford Noble, DO [facility] Available via Epic secure chat 7am-7pm After these hours, please refer to coverage  provider listed on [email or web address] 07/04/2022, 3:34 PM

## 2022-07-04 NOTE — Progress Notes (Signed)
[facility] Progress Note  Name: Jose [name] MRN: [id number] DOB: [birth year]  Chief Complaint:  N/v and diarrhea  Subjective:  He had 2.9 liters UOP over 9/9 (and 5.7 liters UOP the day prior).  He feels like he's lost a bunch of fluid.  Dizziness is better.    Review of systems:   Shortness of breath with exertion is much better Denies chest pain  Still has some nausea and this happens in the AM's  ---------------- Background on referral:  Jose [name] is a 46 y.o. male with a history of CKD, diabetes, hypertension, prior thoracic osteomyelitis who presented to the hospital with nausea with vomiting and diarrhea.  His creatinine was noted to be elevated at 4.01.  Creatinine history is below.  From mid August to present his creatinine has been 4.  Earlier this year creatinine between 2.1 and 2.48.  He has followed with Dr. Hollie Salk at our office.  He received fluids initially and these were stopped.  Suspect ins/outs not fully captured but had 650 ml UOP over 9/4 documented.  Nephrology is consulted for assistance with management.  He recently saw Dr. Hollie Salk on 8/31 for hospital follow-up.  On 06/24/22 his Cr was 3.61, BUN 42, eGFR 20, K 5.6, bicarb 17.  She wasn't sure how much recovery to really expect.  She had started a taper off minoxidil and had started torsemide 40 mg daily.  He states he really only took the torsemide for a day (because he later had n/v and wasn't able to keep it down).  He's not completely off the minodixil; he had been on it for a few weeks.  Diarrhea is a little better.  N/v is better but he states has gotten lots of PRN's. He does feel like he's having trouble getting a deep breath and tightness in his legs.  He was ordered fluids for 10 hours; these expired 13 hours or so ago and are still running.  New RN has just picked up his care and is at bedside.  She turned the fluids off.  His mother and brother have had kidney disease; his brother's was after  a heart attack.       Intake/Output Summary (Last 24 hours) at 07/04/2022 1111 Last data filed at 07/04/2022 6314 Gross per 24 hour  Intake 480 ml  Output 3200 ml  Net -2720 ml    Vitals:  Vitals:   07/03/22 1520 07/03/22 2100 07/04/22 0426 07/04/22 0924  BP: (!) 146/77 (!) 150/73 134/76 (!) 160/78  Pulse: 69  69 73  Resp: 16  16   Temp: 97.9 F (36.6 C) 98.1 F (36.7 C) 97.8 F (36.6 C)   TempSrc:  Oral Oral Oral  SpO2: 93% 95% 92% 92%  Weight:      Height:         Physical Exam:     General: adult male in bed in NAD HEENT: NCAT Eyes: EOMI sclera anicteric Neck: supple trachea midline  Heart: S1S2 no rub Lungs: clear to auscultation; unlabored at rest on room air Abdomen: soft/nt/nd/obese habitus; abd wall edema improved Extremities: 1+ edema; compression stockings on  Skin: no rash on extremities exposed Psych normal mood and affect Neuro:  alert and oriented x 3 provides hx and follows commands  Medications reviewed   Labs:     Latest Ref Rng & Units 07/04/2022   12:36 AM 07/03/2022   10:07 AM 07/02/2022   12:32 AM  BMP  Glucose 70 -  99 mg/dL 153  248  153   BUN 6 - 20 mg/dL 47  42  39   Creatinine 0.61 - 1.24 mg/dL 3.90  3.73  3.72   Sodium 135 - 145 mmol/L 140  140  140   Potassium 3.5 - 5.1 mmol/L 4.2  4.6  4.7   Chloride 98 - 111 mmol/L 101  101  107   CO2 22 - 32 mmol/L 29  25  25    Calcium 8.9 - 10.3 mg/dL 8.6  8.9  8.4      Assessment/Plan:   # CKD stage IV   - Likely 2/2 DM and HTN.  Setting of overload.  Peak weight 105.2 kg and now at 96.2 kg (though not sure if accurate) - We have started a trial of torsemide 40 mg daily - please limit gabapentin to no more than 300 mg over the course of a day given his CKD  - daily weights and strict ins/outs  # HTN  - optimize volume status as above  - continue other current regimen   # Metabolic acidosis - From Diarrhea and CKD - off of sodium bicarb   # Diarrhea  - covid was negative - work-up  per primary team  - improved    # Normocytic anemia - Suspect contribution from his CKD.  Mild iron deficiency - defer IV iron in favor of PO - Started aranesp 40 mcg weekly on Wednesdays and may need to increase if remains inpatient - s/p 1 unit of PRBC's on 9/8 - anticipate a likely need for outpatient ESA   # DM type 2  - per primary team     # Chills  - improved - recent osteo - work-up per primary team    Disposition - would continue inpatient monitoring.  From a strictly renal standpoint would be hopeful for potential discharge on 9/11 if continues to improve.    For planning purposes, I will ensure that he has follow-up with Dr. Hollie Salk or extender clinic at our office 1-2 weeks after discharge   Claudia Desanctis, MD 07/04/2022 11:28 AM

## 2022-07-04 NOTE — Progress Notes (Signed)
Mobility Specialist Progress Note:   07/04/22 1637  Mobility  Activity Ambulated with assistance in hallway  Level of Assistance Contact guard assist, steadying assist  Assistive Device Front wheel walker  Distance Ambulated (ft) 550 ft  Activity Response Tolerated well  $Mobility charge 1 Mobility   Pt received in bed and agreeable. C/o RLE tightness but no pain. Pt left sitting EOB with NT in room.   Kynzli Rease Mobility Specialist-Acute Rehab Secure Chat only

## 2022-07-05 DIAGNOSIS — E8809 Other disorders of plasma-protein metabolism, not elsewhere classified: Secondary | ICD-10-CM

## 2022-07-05 DIAGNOSIS — R197 Diarrhea, unspecified: Secondary | ICD-10-CM

## 2022-07-05 DIAGNOSIS — E872 Acidosis, unspecified: Secondary | ICD-10-CM

## 2022-07-05 DIAGNOSIS — Z862 Personal history of diseases of the blood and blood-forming organs and certain disorders involving the immune mechanism: Secondary | ICD-10-CM

## 2022-07-05 DIAGNOSIS — I10 Essential (primary) hypertension: Secondary | ICD-10-CM | POA: Diagnosis not present

## 2022-07-05 DIAGNOSIS — R112 Nausea with vomiting, unspecified: Secondary | ICD-10-CM | POA: Diagnosis not present

## 2022-07-05 DIAGNOSIS — N189 Chronic kidney disease, unspecified: Secondary | ICD-10-CM

## 2022-07-05 DIAGNOSIS — A084 Viral intestinal infection, unspecified: Secondary | ICD-10-CM

## 2022-07-05 DIAGNOSIS — D649 Anemia, unspecified: Secondary | ICD-10-CM | POA: Diagnosis not present

## 2022-07-05 DIAGNOSIS — N179 Acute kidney failure, unspecified: Secondary | ICD-10-CM | POA: Diagnosis not present

## 2022-07-05 DIAGNOSIS — E669 Obesity, unspecified: Secondary | ICD-10-CM

## 2022-07-05 LAB — COMPREHENSIVE METABOLIC PANEL
ALT: 10 U/L (ref 0–44)
AST: 13 U/L — ABNORMAL LOW (ref 15–41)
Albumin: 2.9 g/dL — ABNORMAL LOW (ref 3.5–5.0)
Alkaline Phosphatase: 107 U/L (ref 38–126)
Anion gap: 7 (ref 5–15)
BUN: 49 mg/dL — ABNORMAL HIGH (ref 6–20)
CO2: 29 mmol/L (ref 22–32)
Calcium: 8.6 mg/dL — ABNORMAL LOW (ref 8.9–10.3)
Chloride: 101 mmol/L (ref 98–111)
Creatinine, Ser: 3.75 mg/dL — ABNORMAL HIGH (ref 0.61–1.24)
GFR, Estimated: 19 mL/min — ABNORMAL LOW (ref >60)
Glucose, Bld: 143 mg/dL — ABNORMAL HIGH (ref 70–99)
Potassium: 4 mmol/L (ref 3.5–5.1)
Sodium: 137 mmol/L (ref 135–145)
Total Bilirubin: 0.8 mg/dL (ref 0.3–1.2)
Total Protein: 5.5 g/dL — ABNORMAL LOW (ref 6.5–8.1)

## 2022-07-05 LAB — CBC WITH DIFFERENTIAL/PLATELET
Abs Immature Granulocytes: 0.03 10*3/uL (ref 0.00–0.07)
Basophils Absolute: 0 10*3/uL (ref 0.0–0.1)
Basophils Relative: 0 %
Eosinophils Absolute: 0.4 10*3/uL (ref 0.0–0.5)
Eosinophils Relative: 5 %
HCT: 25.6 % — ABNORMAL LOW (ref 39.0–52.0)
Hemoglobin: 8.9 g/dL — ABNORMAL LOW (ref 13.0–17.0)
Immature Granulocytes: 0 %
Lymphocytes Relative: 32 %
Lymphs Abs: 2.3 10*3/uL (ref 0.7–4.0)
MCH: 30.1 pg (ref 26.0–34.0)
MCHC: 34.8 g/dL (ref 30.0–36.0)
MCV: 86.5 fL (ref 80.0–100.0)
Monocytes Absolute: 0.9 10*3/uL (ref 0.1–1.0)
Monocytes Relative: 13 %
Neutro Abs: 3.6 10*3/uL (ref 1.7–7.7)
Neutrophils Relative %: 50 %
Platelets: 223 10*3/uL (ref 150–400)
RBC: 2.96 MIL/uL — ABNORMAL LOW (ref 4.22–5.81)
RDW: 12.4 % (ref 11.5–15.5)
WBC: 7.3 10*3/uL (ref 4.0–10.5)
nRBC: 0 % (ref 0.0–0.2)

## 2022-07-05 LAB — MAGNESIUM: Magnesium: 1.7 mg/dL (ref 1.7–2.4)

## 2022-07-05 LAB — PHOSPHORUS: Phosphorus: 4.1 mg/dL (ref 2.5–4.6)

## 2022-07-05 LAB — GLUCOSE, CAPILLARY
Glucose-Capillary: 174 mg/dL — ABNORMAL HIGH (ref 70–99)
Glucose-Capillary: 201 mg/dL — ABNORMAL HIGH (ref 70–99)

## 2022-07-05 MED ORDER — PANTOPRAZOLE SODIUM 40 MG PO TBEC: 40.0000 mg | DELAYED_RELEASE_TABLET | Freq: Every day | ORAL | 0 refills | Status: DC

## 2022-07-05 MED ORDER — POLYSACCHARIDE IRON COMPLEX 150 MG PO CAPS: 150.0000 mg | ORAL_CAPSULE | Freq: Every day | ORAL | 0 refills | Status: DC

## 2022-07-05 MED ORDER — TORSEMIDE 40 MG PO TABS: 40.0000 mg | ORAL_TABLET | Freq: Every day | ORAL | 0 refills | Status: DC

## 2022-07-05 NOTE — Progress Notes (Signed)
Mobility Specialist Progress Note   07/05/22 1045  Mobility  Activity Refused mobility   Cancelled: Pt prepping for d/c home and requesting/requiring no further assistance. Mobility Specialist no longer required.  Holland Falling Mobility Specialist MS [facility] #:  [phone] Acute Rehab Office:  [phone]

## 2022-07-05 NOTE — Discharge Summary (Signed)
Physician Discharge Summary   Patient: Jose [name] MRN: [id number] DOB: [birth year]  Admit date:     06/27/2022  Discharge date: 07/05/22  Discharge Physician: Raiford Noble, DO   PCP: Madelon Lips, MD   Recommendations at discharge:   Follow-up with PCP within 1 to 2 weeks and repeat CBC, CMP, mag, Phos within 1 week Follow-up with gastroenterology within 1 to 2 weeks and continue PPI  Follow-up with nephrology within 1 to 2 weeks and repeat CMP within 1 week and continue torsemide 40 mg p.o. daily Follow-up with infectious disease in the outpatient setting for history of vertebral osteomyelitis for further evaluation Follow-up with pulmonary and sleep clinic for outpatient evaluation for OSA with a sleep study  Discharge Diagnoses: Principal Problem:   Intractable nausea and vomiting Active Problems:   Acute kidney injury superimposed on chronic kidney disease ([place])   Vertebral osteomyelitis ([place])   Type 2 diabetes mellitus with chronic kidney disease, with long-term current use of insulin (HCC)   Anemia   GAD (generalized anxiety disorder)   Hyperlipidemia   Essential hypertension   Hypoalbuminemia   Diarrhea   Viral gastroenteritis   Metabolic acidosis   History of anemia due to chronic kidney disease   Obesity (BMI 30-39.9)  Resolved Problems:   * No resolved hospital problems. [facility] Course: The patient is a obese Caucasian male with a past medical history significant for but not limited to hypertension, hyperlipidemia, diabetes mellitus type 2, history of chronic kidney disease stage IV, history of thoracic osteomyelitis/discitis as well as other comorbidities who presented to the ED with complaints of intractable nausea and vomiting with associated loose bowel movements and diarrhea as well.  He states that he has noticed that he may continue to retain some fluid since he got discharged in the hospital last.  Denies any sick contacts and had some mild abdominal  pain but is exercising.  Also has pain and back pain and shaking and has spasms in his back pain is not changed from his prior visit.  He denies fevers at home but had more core temperature of 99.5 yesterday He was in bed with covers.  He has been nauseous.  A lot last time and has not really thrown up to 4-13 times and has used Zofran at home which has helped but he has not been able to eat anything.  Is resting admitted for nausea vomiting AKI on CKD stage IV from 8/9 to 06/08/2022.  He was presented with SIRS criteria and concern for recurrent osteomyelitis infection.  Imaging at that time showed no acute changes and ID neurosurgery consulted and blood cultures remain negative.  There were no concerns for recurrent osteomyelitis at that time and he does have hypertensive urgency and blood pressure medications were adjusted.  Currently he denies any fevers but has had chills and headaches as well as leg swelling.  He was seen by his nephrologist on Thursday and placed on a diuretic of torsemide.  In the ED he was given 500 mill bolus as well as placed on maintenance IV fluids at 75 mils per hour for 10 hours.  We will remove this continue to monitor his renal function carefully given that he continues have intractable nausea vomiting.  CT scan of the abdomen pelvis was done and showed "There is no evidence of intestinal obstruction or pneumoperitoneum. There is no hydronephrosis.   There is liquid stool in the lumen of colon and rectum. This may be a normal  variation or suggest  nonspecific enterocolitis. There is no significant wall thickening or pericolic stranding. Gallbladder stones. Small hiatal hernia. Small bilateral pleural effusions. There are small patchy infiltrates in both lower lung fields suggesting atelectasis/pneumonia.  cardiomegaly. Coronary artery calcifications are seen."   Patient is diarrhea is improving however he continues to be significantly nauseous and have some vomiting but this is  improving slightly.  Renal function continues to improve a little bit but he appears extremely volume overloaded so nephrology been consulted for assistance.  Given that he continues to be intermittently nauseous and vomited all day yesterday we will consult GI for further evaluation.  His nausea vomiting and diarrhea improving slowly and GI recommends continuing supportive care for now.  They are recommending advancing diet as tolerated and obtain a GI pathogen panel and C. difficile if recurrence of diarrhea occurs.   IV fluids have stopped and nephrology has been evaluating and diuresing the patient daily and have given him a dose of extra Lasix this afternoon again as well as 1 unit of blood given his hemoglobin dropping to 7.  He continues to improve slowly.    He was transitioned off of diuretics and improved significantly and tolerating his diet without issues.  He is much improved and he will need to follow-up with his PCP as well as nephrologist and GI doctor in outpatient setting given that he is now stable for discharge  Assessment and Plan: * Intractable nausea and vomiting with associated diarrhea in the setting of viral gastroenteritis, improving significantly -46 year old with 2 day history intractable N/V/D in setting of progressing CKD -Place in observation telemetry -anti-emetics,slowly advance diet -check stool studies/quick c.diff. no hx of sick contacts. Had brief IV abx during last hospitalization a few weeks ago -Was recently started on a diuretic on Thursday however had intractable nausea vomiting and this has been held given concern for dehydration and now being resumed by nephrology -Strict I/O's and see below -Hold bethanechol -Continue with antiemetics with p.o./IV Ondansetron 4 mg may need Compazine; his nausea vomiting is improving and continues to have some mild nausea but this is improved -GI has been consulted and feel that he has gastroenteritis and recommends  continuing supportive care given that his symptoms are improving and recommending obtaining GI pathogen panel and C. difficile if he has recurrence of diarrhea; he had 1 episode of loose stool the day before yesterday but his vomiting stopped and he continues to be slightly nauseous but improving slowly.  GI is now recommending starting the patient on a PPI and starting pantoprazole 40 mg daily for history of esophagitis and adding Carafate 1 g twice daily for 7 days and avoiding long-term use of Carafate given his chronic kidney disease but will per 07/02/2022 at the recommendations of nephrology recommendations -We will stop the stool studies given that his stools are improved -We will continue Pantoprazole 40 g p.o. daily at the time of discharge   Acute kidney injury superimposed on chronic kidney disease Stage IV (HCC) Metabolic Acidosis -Baseline creatinine was around 2-2.5;  -however, during recent hospitalization creatinine remained around 3.9.  -Nephrology was consulted and felt this would be closer to his new baseline.  Patient's BUNs/creatinine has now trended down to 42/3.74 -> 37/3.66 -> 36/3.72 -> 42/3.73 -> 47/3.90 and is further trending down to 49/2.75 at the time of discharge -Avoid nephrotoxic medications, contrast dyes, hypotension and dehydration to ensure adequate renal perfusion and will need to renally adjust medications -Renal ultrasound  8/12 significant for suggested medical renal disease with mild increased cortical echogenicity of the kidneys -Strict I/O's and Daily Weights; he is -12.034 mL since admission and his weight is down 20 pounds since admission and not repeated today -Patient's metabolic acidosis is improved and he has a CO2 of 29, anion gap of 10, chloride level 101 -Nephrology had started the patient on sodium bicarbonate 650 mg p.o. 3 times daily but are now stopping  -IV fluid hydration was ordered for 10 hours however it was continuously running for some reason  and this is stopped by the nephrologist and now he will be getting IV Lasix with albumin: He is off of the IV Lasix 80 mg twice daily and now on p.o. torsemide 40 mg daily and nephrology wanted to observe another day; since he is improved and tolerated his p.o. torsemide well he is stable for discharge -UA with ketones and protein, similar to last UA in setting of intractable N/V  -Continue to monitor and trend and repeat CMP within 1 week   Type 2 diabetes mellitus with chronic kidney disease, with long-term current use of insulin (HCC) -No anion gap, but he did have ketones in urine and has a slight metabolic acidosis.  -Recent A1C in 8/23 was 7.7 -On 70/30, substitute with semglee decreased dose of 8 units with decreased PO intake  -Has not started his mounjaro -Continue with Sensitive NovoLog SSI and accuchecks AC -CBGs ranging from 125-201   Vertebral Osteomyelitis (HCC) -No change in back pain, no leukocytosis, normal lactic acid -MRI during 8/9 admit with no evidence of recurrent OM -Continue pain control as he continues to have some spasms -Off antibiotics since April -Need close ID follow-up and may discuss with them but he appears to have no Leukocytosis but he continues to have the spasms and may need reimaging as an outpatient -Follow-up with ID in outpatient setting within 1 to 2 weeks   Anemia of Chronic Kidney Disease -Likely had a dilutional drop as patient's hemoglobin/hematocrit went from 8.8/24.0 -> 7.1/21.5 -> 7.2/21.5 -> 7.5/22.0 -> 8.1/23.7 -> 7.0/20.3 the day before yesterday and he was given 1 unit of PRBCs.  Hemoglobin/hematocrit is improved after that and repeat after was 8.7/24.5 and yesterday morning was 9.7/27.9 -> 8.9/25.9 and today was 8.9/25.6 again -Check Anemia Panel and it showed an iron level of 56, U IBC 190, TIBC 246, saturation greater than 23%, ferritin level 110 -Received a dose of Aranesp and will continue in the outpatient setting by nephrology -He  had slight dizziness yesterday and could have been in the setting of low hemoglobin -Continue to monitor for signs and symptoms of bleeding; no overt bleeding noted -Repeat CBC within 1 week   GAD (generalized anxiety disorder) -Continue Paroxetine 20 mg p.o. nightly, Mirtazapine 50 mg p.o. nightly and Hydroxyzine 25 mg p.o. 3 times daily as needed anxiety nausea   Essential Hypertension -Normotensive-mildly elevated, but improved from HTN urgency on prior admit and he has not taken any of his medication  -Continue home medication of Amlodipine 10mg  daily, Hydralazine 50mg  TID and Metoprolol 75mg  BID and will Titrate as needed  -Will be discharged on torsemide 40 mg p.o. daily -Continue monitor blood pressures per protocol and last blood pressure reading was 147/78   Hyperlipidemia -Continue Atorvastatin 40 mg po Daily     Hypoalbuminemia -Patient's albumin level went from 3.2  -> 2.7 -> 3.3 -> 2.9 x2 -Continue To monitor and trend and repeat CMP in 1 week.  Obesity -Complicates overall prognosis and care -Estimated body mass index is 29.58 kg/m as calculated from the following:   Height as of this encounter: 5\' 11"  (1.803 m).   Weight as of this encounter: 96.2 kg.  -Weight Loss and Dietary Counseling given  Active Pressure Injury/Wound(s)     Pressure Ulcer  Duration          Pressure Injury 08/10/21 Coccyx Medial Unstageable - Full thickness tissue loss in which the base of the injury is covered by slough (yellow, tan, gray, green or brown) and/or eschar (tan, brown or black) in the wound bed. 329 days           Consultants: Gastroenterology and nephrology Procedures performed: As delineated above Disposition: Home Diet recommendation:  Discharge Diet Orders (From admission, onward)     Start     Ordered   07/05/22 0000  Diet - low sodium heart healthy        07/05/22 1118           Cardiac and Carb modified diet DISCHARGE MEDICATION: Allergies as of  07/05/2022       Reactions   Cranberry Itching   Hm Lidocaine Patch [lidocaine] Dermatitis   Blisters skin    Melatonin Other (See Comments)   nightmares   Nsaids Other (See Comments)   Stage 3 kidney disease   Trulicity [dulaglutide] Other (See Comments)   Night sweats  Uncontrolled tempeture         Medication List     STOP taking these medications    bethanechol 10 MG tablet Commonly known as: URECHOLINE   tirzepatide 5 MG/0.5ML Pen Commonly known as: MOUNJARO       TAKE these medications    Acetaminophen 500 MG capsule Take 500-1,000 mg by mouth every 6 (six) hours as needed for mild pain.   albuterol 108 (90 Base) MCG/ACT inhaler Commonly known as: VENTOLIN HFA Inhale 1 puff into the lungs every 4 (four) hours as needed for wheezing or shortness of breath.   amLODipine 10 MG tablet Commonly known as: NORVASC Take 1 tablet (10 mg total) by mouth daily.   atorvastatin 40 MG tablet Commonly known as: LIPITOR Take 40 mg by mouth daily.   cholecalciferol 25 MCG (1000 UNIT) tablet Commonly known as: VITAMIN D3 Take 1,000 Units by mouth daily.   FreeStyle Libre 3 Sensor Misc 1 Device by Does not apply route every 14 (fourteen) days. Apply 1 sensor on upper arm every 14 days for continuous glucose monitoring   gabapentin 100 MG capsule Commonly known as: NEURONTIN Take 2 capsules (200 mg total) by mouth 2 (two) times daily.   hydrALAZINE 50 MG tablet Commonly known as: APRESOLINE Take 1 tablet (50 mg total) by mouth every 8 (eight) hours.   hydrOXYzine 25 MG tablet Commonly known as: ATARAX Take 1 tablet (25 mg total) by mouth 3 (three) times daily as needed for anxiety or nausea.   insulin NPH-regular Human (70-30) 100 UNIT/ML injection Inject 8-12 Units into the skin See admin instructions. Taking 12  units in the Am and 8 units at night   iron polysaccharides 150 MG capsule Commonly known as: NIFEREX Take 1 capsule (150 mg total) by mouth  daily. Start taking on: July 06, 2022   Metoprolol Tartrate 75 MG Tabs Take 75 mg by mouth 2 (two) times daily.   mirtazapine 15 MG tablet Commonly known as: REMERON Take 1 tablet (15 mg total) by mouth at bedtime.   ondansetron 4  MG disintegrating tablet Commonly known as: ZOFRAN-ODT Take 4 mg by mouth daily.   oxyCODONE 5 MG immediate release tablet Commonly known as: Oxy IR/ROXICODONE Take 1 tablet (5 mg total) by mouth every 6 (six) hours as needed for severe pain or breakthrough pain.   pantoprazole 40 MG tablet Commonly known as: PROTONIX Take 1 tablet (40 mg total) by mouth daily. Start taking on: July 06, 2022   PARoxetine 20 MG tablet Commonly known as: PAXIL Take 1 tablet (20 mg total) by mouth at bedtime.   Pentips 32G X 4 MM Misc Generic drug: Insulin Pen Needle use as directed   polyethylene glycol 17 g packet Commonly known as: MIRALAX / GLYCOLAX Take 17 g by mouth daily as needed for moderate constipation, mild constipation or severe constipation.   tamsulosin 0.4 MG Caps capsule Commonly known as: FLOMAX Take 1 capsule (0.4 mg total) by mouth daily after supper.   tobramycin 0.3 % ophthalmic solution Commonly known as: TOBREX Place 1 drop into both eyes as directed. Retina injections   Torsemide 40 MG Tabs Take 40 mg by mouth daily. Start taking on: July 06, 2022 What changed:  medication strength how much to take when to take this        Follow-up Information     Madelon Lips, MD Follow up.   Specialty: Nephrology Contact information: [place] [address] [phone]         Gastroenterology, [facility] Follow up in 3 month(s).   Why: Posthospitalization follow-up, due for  screening colonoscopy Contact information: [address]  [place] [address] [phone]                Discharge Exam: Filed Weights   06/27/22 1921 06/30/22 0547 07/03/22 1051  Weight: 100.8 kg 103.7 kg  96.2 kg   Vitals:   07/04/22 2054 07/05/22 0830  BP: (!) 148/73 (!) 147/78  Pulse: 71 70  Resp: 14 18  Temp: 98.5 F (36.9 C) 98.6 F (37 C)  SpO2: 94% 100%   Examination: Physical Exam:  Constitutional: WN/WD obese Caucasian male currently no acute distress appears calm Respiratory: Diminished to auscultation bilaterally with coarse breath sounds, no wheezing, rales, rhonchi or crackles. Normal respiratory effort and patient is not tachypenic. No accessory muscle use.  Unlabored breathing Cardiovascular: RRR, no murmurs / rubs / gallops. S1 and S2 auscultated.  1+ lower extremity edema Abdomen: Soft, non-tender, distended secondary to body habitus. Bowel sounds positive.  GU: Deferred. Musculoskeletal: No clubbing / cyanosis of digits/nails. No joint deformity upper and lower extremities. Skin: No rashes, lesions, ulcers on limited skin evaluation. No induration; Warm and dry.  Neurologic: CN 2-12 grossly intact with no focal deficits. Romberg sign and cerebellar reflexes not assessed.  Psychiatric: Normal judgment and insight. Alert and oriented x 3. Normal mood and appropriate affect.   Condition at discharge: stable  The results of significant diagnostics from this hospitalization (including imaging, microbiology, ancillary and laboratory) are listed below for reference.   Imaging Studies: CT ABDOMEN PELVIS WO CONTRAST  Result Date: 06/27/2022 CLINICAL DATA:  Abdominal pain, nausea, vomiting EXAM: CT ABDOMEN AND PELVIS WITHOUT CONTRAST TECHNIQUE: Multidetector CT imaging of the abdomen and pelvis was performed following the standard protocol without IV contrast. RADIATION DOSE REDUCTION: This exam was performed according to the departmental dose-optimization program which includes automated exposure control, adjustment of the mA and/or kV according to patient size and/or use of iterative reconstruction technique. COMPARISON:  CT done on  08/06/2021, renal sonogram done on 06/05/2022  FINDINGS: Lower chest: Heart is enlarged in size. Coronary artery calcifications are seen. Small bilateral pleural effusions are seen. Small patchy infiltrates are seen in both lower lung fields. Hepatobiliary: No focal abnormalities are seen in liver. Gallbladder stones are seen. There is no wall thickening in gallbladder. Pancreas: No focal abnormalities are seen. Spleen: Unremarkable. Adrenals/Urinary Tract: Adrenals are unremarkable. There is no hydronephrosis. There are no renal or ureteral stones. Urinary bladder is not distended. Stomach/Bowel: Small hiatal hernia is seen. There is moderate distention of stomach. Small bowel loops are unremarkable. Appendix is not seen. There is no significant wall thickening in colon. There is fluid in the lumen of the colon and rectum. Vascular/Lymphatic: Scattered arterial calcifications are seen. Reproductive: Prostate is unremarkable. Calcifications are seen in seminal vesicles and vas deferens. Other: There is no ascites or pneumoperitoneum. Umbilical and paraumbilical hernias containing fat are noted. Bilateral inguinal hernias containing fat are seen, larger on the left side. There is mild edema in subcutaneous plane in the abdominal wall, possibly suggesting anasarca. Musculoskeletal: Unremarkable. IMPRESSION: There is no evidence of intestinal obstruction or pneumoperitoneum. There is no hydronephrosis. There is liquid stool in the lumen of colon and rectum. This may be a normal variation or suggest nonspecific enterocolitis. There is no significant wall thickening or pericolic stranding. Gallbladder stones. Small hiatal hernia. Small bilateral pleural effusions. There are small patchy infiltrates in both lower lung fields suggesting atelectasis/pneumonia. Cardiomegaly. Coronary artery calcifications are seen. Other findings as described in the body of the report. Electronically Signed   By: Elmer Picker M.D.   On: 06/27/2022 13:36   DG Chest Port 1  View  Result Date: 06/27/2022 CLINICAL DATA:  Sepsis. EXAM: PORTABLE CHEST 1 VIEW COMPARISON:  June 02, 2022. FINDINGS: Mild cardiomegaly is noted. Lungs are clear. Bony thorax is unremarkable. IMPRESSION: No active disease. Electronically Signed   By: Marijo Conception M.D.   On: 06/27/2022 09:36   CT HEAD WO CONTRAST (5MM)  Result Date: 06/05/2022 CLINICAL DATA:  Weakness and dizziness. EXAM: CT HEAD WITHOUT CONTRAST TECHNIQUE: Contiguous axial images were obtained from the base of the skull through the vertex without intravenous contrast. RADIATION DOSE REDUCTION: This exam was performed according to the departmental dose-optimization program which includes automated exposure control, adjustment of the mA and/or kV according to patient size and/or use of iterative reconstruction technique. COMPARISON:  06/02/2022 FINDINGS: Brain: No evidence of intracranial hemorrhage, acute infarction, hydrocephalus, extra-axial collection, or mass lesion/mass effect. Vascular:  No hyperdense vessel or other acute findings. Skull: No evidence of fracture or other significant bone abnormality. Sinuses/Orbits:  No acute findings. Other: None. IMPRESSION: Negative noncontrast head CT. Electronically Signed   By: Marlaine Hind M.D.   On: 06/05/2022 18:57    Microbiology: Results for orders placed or performed during the hospital encounter of 06/27/22  SARS Coronavirus 2 by RT PCR (hospital order, performed in [facility] hospital lab) *cepheid single result test* Peripheral     Status: None   Collection Time: 06/27/22  9:03 AM   Specimen: Peripheral; Nasal Swab  Result Value Ref Range Status   SARS Coronavirus 2 by RT PCR NEGATIVE NEGATIVE Final    Comment: (NOTE) SARS-CoV-2 target nucleic acids are NOT DETECTED.  The SARS-CoV-2 RNA is generally detectable in upper and lower respiratory specimens during the acute phase of infection. The lowest concentration of SARS-CoV-2 viral copies this assay can detect is  250 copies / mL. A negative result  does not preclude SARS-CoV-2 infection and should not be used as the sole basis for treatment or other patient management decisions.  A negative result may occur with improper specimen collection / handling, submission of specimen other than nasopharyngeal swab, presence of viral mutation(s) within the areas targeted by this assay, and inadequate number of viral copies (<250 copies / mL). A negative result must be combined with clinical observations, patient history, and epidemiological information.  Fact Sheet for Patients:   https://www.patel.info/  Fact Sheet for Healthcare Providers: https://hall.com/  This test is not yet approved or  cleared by the [place] FDA and has been authorized for detection and/or diagnosis of SARS-CoV-2 by FDA under an Emergency Use Authorization (EUA).  This EUA will remain in effect (meaning this test can be used) for the duration of the COVID-19 declaration under Section 564(b)(1) of the Act, 21 U.S.C. section 360bbb-3(b)(1), unless the authorization is terminated or revoked sooner.  Performed at [facility], [place] [address], [place], [place] [address]   Blood Culture (routine x 2)     Status: None   Collection Time: 06/27/22  9:08 AM   Specimen: BLOOD RIGHT WRIST  Result Value Ref Range Status   Specimen Description BLOOD RIGHT WRIST  Final   Special Requests   Final    BOTTLES DRAWN AEROBIC AND ANAEROBIC Blood Culture results may not be optimal due to an inadequate volume of blood received in culture bottles   Culture   Final    NO GROWTH 5 DAYS Performed at [facility], [place] [address] [place], [place] [address]    Report Status 07/02/2022 FINAL  Final  Blood Culture (routine x 2)     Status: None   Collection Time: 06/27/22  9:18 AM   Specimen: BLOOD LEFT FOREARM  Result Value Ref Range Status   Specimen Description BLOOD LEFT FOREARM  Final    Special Requests   Final    BOTTLES DRAWN AEROBIC AND ANAEROBIC Blood Culture results may not be optimal due to an inadequate volume of blood received in culture bottles   Culture   Final    NO GROWTH 5 DAYS Performed at [facility], [place] [address], [place], [place] [address]    Report Status 07/02/2022 FINAL  Final   Labs: CBC: Recent Labs  Lab 07/01/22 0902 07/02/22 0032 07/02/22 2205 07/03/22 1007 07/04/22 0036 07/05/22 0157  WBC 5.9 5.2  --  8.0 7.0 7.3  NEUTROABS 3.3 2.5  --  5.3 3.5 3.6  HGB 8.1* 7.0* 8.7* 9.7* 8.9* 8.9*  HCT 23.7* 20.3* 24.5* 27.9* 25.9* 25.6*  MCV 89.4 88.6  --  87.7 86.9 86.5  PLT 177 172  --  219 221 272   Basic Metabolic Panel: Recent Labs  Lab 07/01/22 0902 07/02/22 0032 07/03/22 1007 07/04/22 0036 07/05/22 0157  NA 140 140 140 140 137  K 4.9 4.7 4.6 4.2 4.0  CL 110 107 101 101 101  CO2 21* 25 25 29 29   GLUCOSE 126* 153* 248* 153* 143*  BUN 36* 39* 42* 47* 49*  CREATININE 3.72* 3.72* 3.73* 3.90* 3.75*  CALCIUM 8.7* 8.4* 8.9 8.6* 8.6*  MG 1.9 1.8 1.7 1.7 1.7  PHOS 4.7* 4.4 4.1 3.9 4.1   Liver Function Tests: Recent Labs  Lab 07/01/22 0902 07/02/22 0032 07/03/22 1007 07/04/22 0036 07/05/22 0157  AST 14* 11* 16 14* 13*  ALT 14 13 13 11 10   ALKPHOS 124 106 116 115 107  BILITOT 0.5 0.9 0.6 0.3 0.8  PROT 6.0* 5.1* 6.1* 5.5* 5.5*  ALBUMIN 3.2* 2.7* 3.3* 2.9* 2.9*   CBG: Recent Labs  Lab 07/04/22 1204 07/04/22 1634 07/04/22 2053 07/05/22 0830 07/05/22 1112  GLUCAP 149* 125* 182* 174* 201*   Discharge time spent: greater than 30 minutes.  Signed: Raiford Noble, DO [facility] 07/05/2022

## 2022-07-05 NOTE — Progress Notes (Signed)
[facility] Progress Note  Name: Jose [name] MRN: [id number] DOB: 28-Dec-1975  Chief Complaint:  N/v and diarrhea  Subjective:  He had 1.7 liters UOP over 9/9 (and 2.9 liters UOP the day prior).  Overall feeling much better.  Ate good breakfast.  Feels ready for d/c.  Family bedside.   Review of systems:   Shortness of breath with exertion is much better Denies chest pain  Still has some nausea and this happens in the AM's   ---------------- Background on referral:  JERMARI TAMARGO is a 46 y.o. male with a history of CKD, diabetes, hypertension, prior thoracic osteomyelitis who presented to the hospital with nausea with vomiting and diarrhea.  His creatinine was noted to be elevated at 4.01.  Creatinine history is below.  From mid August to present his creatinine has been 4.  Earlier this year creatinine between 2.1 and 2.48.  He has followed with Dr. Hollie Salk at our office.  He received fluids initially and these were stopped.  Suspect ins/outs not fully captured but had 650 ml UOP over 9/4 documented.  Nephrology is consulted for assistance with management.  He recently saw Dr. Hollie Salk on 8/31 for hospital follow-up.  On 06/24/22 his Cr was 3.61, BUN 42, eGFR 20, K 5.6, bicarb 17.  She wasn't sure how much recovery to really expect.  She had started a taper off minoxidil and had started torsemide 40 mg daily.  He states he really only took the torsemide for a day (because he later had n/v and wasn't able to keep it down).  He's not completely off the minodixil; he had been on it for a few weeks.  Diarrhea is a little better.  N/v is better but he states has gotten lots of PRN's. He does feel like he's having trouble getting a deep breath and tightness in his legs.  He was ordered fluids for 10 hours; these expired 13 hours or so ago and are still running.  New RN has just picked up his care and is at bedside.  She turned the fluids off.  His mother and brother have had kidney disease;  his brother's was after a heart attack.       Intake/Output Summary (Last 24 hours) at 07/05/2022 0759 Last data filed at 07/04/2022 2025 Gross per 24 hour  Intake 840 ml  Output 250 ml  Net 590 ml     Vitals:  Vitals:   07/04/22 0924 07/04/22 1720 07/04/22 2031 07/04/22 2054  BP: (!) 160/78 135/72 135/73 (!) 148/73  Pulse: 73 70  71  Resp:  18  14  Temp: 98.4 F (36.9 C) 98.4 F (36.9 C)  98.5 F (36.9 C)  TempSrc: Oral Oral  Oral  SpO2: 92% 96%  94%  Weight:      Height:         Physical Exam:     General: adult male sitting at EOB in NAD HEENT: NCAT Eyes: EOMI sclera anicteric Neck: supple trachea midline  Heart: S1S2 no rub Lungs: clear to auscultation; unlabored at rest on room air Abdomen: soft/nt/nd/obese habitus; abd wall edema improved Extremities: trace edema; compression stockings on   Skin: no rash on extremities exposed Psych normal mood and affect Neuro:  alert and oriented x 3 provides hx and follows commands  Medications reviewed   Labs:     Latest Ref Rng & Units 07/05/2022    1:57 AM 07/04/2022   12:36 AM 07/03/2022   10:07 AM  BMP  Glucose 70 - 99 mg/dL 143  153  248   BUN 6 - 20 mg/dL 49  47  42   Creatinine 0.61 - 1.24 mg/dL 3.75  3.90  3.73   Sodium 135 - 145 mmol/L 137  140  140   Potassium 3.5 - 5.1 mmol/L 4.0  4.2  4.6   Chloride 98 - 111 mmol/L 101  101  101   CO2 22 - 32 mmol/L 29  29  25    Calcium 8.9 - 10.3 mg/dL 8.6  8.6  8.9      Assessment/Plan:   # CKD stage IV:  f/b Dr. Hollie Salk.   11/2021 Cr in the mid 2s, now in high 3s - mid 4s with GFR high teens since 05/2022.   - Likely 2/2 DM and HTN.  Seen in the setting of volume overload.   - Currently on torsemide 40 mg daily, volume status improving - would d/c on this dose and I gave him instructions to hold if dry, call office if ^ edema - if no improvement in GFR will need dialysis planning, transplant referral if appropriate - will be addressed at clinic f/u - please limit  gabapentin to no more than 300 mg over the course of a day given his CKD  - daily weights and strict ins/outs  # HTN  - optimizing volume status as above  -BPs labile over the past 12h - some in the 130s, some in the 160s - avoid overtreatment/hypotension - continue other current regimen   # Metabolic acidosis - From Diarrhea and CKD - off of sodium bicarb with normal serum bicarb   # Diarrhea  - covid was negative - work-up per primary team  - improved    # Normocytic anemia - Suspect contribution from his CKD.  Mild iron deficiency - deferred IV iron in favor of PO - Started aranesp 40 mcg weekly on Wednesdays - s/p 1 unit of PRBC's on 9/8, stable in high 8s currently - anticipate a likely need for outpatient ESA - defer referral to the time of clinic f/u if it's necessary   # DM type 2  - per primary team     # Chills  - improved - recent osteo - work-up per primary team    Disposition - [facility] for discharge today.   Dr. Royce Macadamia will ensure that he has follow-up with Dr. Hollie Salk or extender clinic at our office 1-2 weeks after discharge.  Pt aware.    Justin Mend, MD 07/05/2022 7:59 AM

## 2022-07-12 ENCOUNTER — Other Ambulatory Visit: Payer: Self-pay | Admitting: Physical Medicine and Rehabilitation

## 2022-07-13 ENCOUNTER — Ambulatory Visit: Payer: 59 | Admitting: Nutrition

## 2022-07-19 ENCOUNTER — Encounter: Payer: 59 | Attending: Home Modifications | Admitting: Nutrition

## 2022-07-19 DIAGNOSIS — Z794 Long term (current) use of insulin: Secondary | ICD-10-CM | POA: Diagnosis present

## 2022-07-19 DIAGNOSIS — E1122 Type 2 diabetes mellitus with diabetic chronic kidney disease: Secondary | ICD-10-CM | POA: Insufficient documentation

## 2022-07-19 DIAGNOSIS — N184 Chronic kidney disease, stage 4 (severe): Secondary | ICD-10-CM | POA: Insufficient documentation

## 2022-07-20 NOTE — Patient Instructions (Signed)
REad over [facility] and call help line if questions or if sensor falls off. Replace sensor every 14 days. Remove phone case when starting new sensor.

## 2022-07-20 NOTE — Progress Notes (Signed)
Libre 3 app was downloaded to his phone and this was linked to [place].  He was shown how to insert and start the sensor by scanning this with his phone.  His phone would not do this and help line was contacted.  He is needing to remove his phone case to do this.He reported good understanding of this.  He had no final questions.

## 2022-08-10 ENCOUNTER — Encounter: Payer: Self-pay | Admitting: Endocrinology

## 2022-08-10 ENCOUNTER — Ambulatory Visit (INDEPENDENT_AMBULATORY_CARE_PROVIDER_SITE_OTHER): Payer: 59 | Admitting: Endocrinology

## 2022-08-10 VITALS — BP 130/74 | HR 74 | Ht 71.0 in | Wt 203.6 lb

## 2022-08-10 DIAGNOSIS — E1065 Type 1 diabetes mellitus with hyperglycemia: Secondary | ICD-10-CM | POA: Diagnosis not present

## 2022-08-10 NOTE — Patient Instructions (Addendum)
10 units am and 14 in pm before supper  Take 2 U more in pm if am sugar stays >150

## 2022-08-10 NOTE — Progress Notes (Signed)
Patient ID: Jose [name], male   DOB: 30-Jun-1976, 46 y.o.   MRN: [id number]           Reason for Appointment: Type II Diabetes follow-up   History of Present Illness   Diagnosis date: age 23  Previous history:  He was initially put on medications at the time of diagnosis but only in his early 34s  Non-insulin hypoglycemic drugs previously used: Trulicity, Byetta, Amaryl, metformin, Tradjenta, Farxiga and Invokana Insulin was started in 2022 He has previously been taking Levemir and Lantus insulin as well as NovoLog  A1c range in the last few years is:7-14.2  Recent history:     Non-insulin hypoglycemic drugs: None     Insulin regimen: 12 units of 70/30 a.m. and 8 units of insulin before dinner    Side effects from medications: Trulicity caused nausea and tremor  Current self management, blood sugar patterns and problems identified:  A1c is last 7.7 but he thinks it was 8.2 this week at dialysis Fructosamine is last 387 His insulin was supposed to be changed to Toujeo and NovoLog but he has continued premixed insulin Also has not taken [facility] which he took only for 1 injection prior to his hospitalization for sepsis and was told by the nephrologist not to start this yet He is taking insulin twice a day as directed but blood sugars are overall much higher than before His blood sugars are primarily higher after dinner and overnight He is not eating excessive portions and not clear if he is losing weight because of fluid changes However has been able to use a freestyle libre meter and was instructed by nurse educator on 9/25 He has just started hemodialysis He does try to take his insulin before starting to eat  Exercise: Minimal Diet management: Mostly eating 3 meals     Hypoglycemia: As above  Freestyle libre 3 analysis for the last 2 weeks as follows  Blood sugars are consistently above the target range except before noon on an average HIGHEST blood sugars are late  in the evening after 10 PM through 2 AM but lowest before 12 noon High variability is present over the last 2 weeks with CV 32%; however most of the variability is midday and afternoons Overnight blood sugars are very consistently high and averaging in the upper 200s and gradually decreasing till about 8 AM POSTPRANDIAL readings are difficult to identify but blood sugars may rise variably after lunch and dinner Generally blood sugars are trending higher between 6 PM-midnight Except for low normal reading once at noon and occasional low normal readings midday no hypoglycemia present  CGM use % of time 89  2-week average/GV 221/32  Time in range      27%  % Time Above 180 38  % Time above 250 35  % Time Below 70 0     PRE-MEAL Fasting Lunch Dinner Bedtime Overall  Glucose range:       Averages: 200  189 271    POST-MEAL PC Breakfast PC Lunch PC Dinner  Glucose range:     Averages: 176  253   Previously   PRE-MEAL Fasting Lunch Dinner Bedtime Overall  Glucose range: 104-174 121     Mean/median:   116     POST-MEAL PC Breakfast PC Lunch PC Dinner  Glucose range:  ? ?  Mean/median:           Weight control:  Wt Readings from Last 3 Encounters:  08/10/22 203 lb 9.6  oz (92.4 kg)  07/03/22 212 lb 1.3 oz (96.2 kg)  06/22/22 232 lb 12.8 oz (105.6 kg)            Diabetes labs: A1c recently was 8.2 at dialysis center  Lab Results  Component Value Date   HGBA1C 7.7 (H) 06/17/2022   HGBA1C 7.8 (H) 06/05/2022   HGBA1C 11.4 (A) 02/18/2022   Lab Results  Component Value Date   LDLCALC 57 11/22/2019   CREATININE 3.75 (H) 07/05/2022   Lab Results  Component Value Date   FRUCTOSAMINE 387 (H) 04/23/2022     Allergies as of 08/10/2022       Reactions   Cranberry Itching   Hm Lidocaine Patch [lidocaine] Dermatitis   Blisters skin    Melatonin Other (See Comments)   nightmares   Nsaids Other (See Comments)   Stage 3 kidney disease   Trulicity [dulaglutide] Other (See  Comments)   Night sweats  Uncontrolled tempeture         Medication List        Accurate as of August 10, 2022  9:18 PM. If you have any questions, ask your nurse or doctor.          Acetaminophen 500 MG capsule Take 500-1,000 mg by mouth every 6 (six) hours as needed for mild pain.   albuterol 108 (90 Base) MCG/ACT inhaler Commonly known as: VENTOLIN HFA Inhale 1 puff into the lungs every 4 (four) hours as needed for wheezing or shortness of breath.   ALPRAZolam 0.25 MG tablet Commonly known as: XANAX Take 0.25 mg by mouth 2 (two) times daily as needed.   amLODipine 10 MG tablet Commonly known as: NORVASC Take 1 tablet (10 mg total) by mouth daily.   atorvastatin 40 MG tablet Commonly known as: LIPITOR Take 40 mg by mouth daily.   bethanechol 10 MG tablet Commonly known as: URECHOLINE Take 10 mg by mouth 3 (three) times daily.   cholecalciferol 25 MCG (1000 UNIT) tablet Commonly known as: VITAMIN D3 Take 1,000 Units by mouth daily.   FreeStyle Libre 3 Sensor Misc 1 Device by Does not apply route every 14 (fourteen) days. Apply 1 sensor on upper arm every 14 days for continuous glucose monitoring   gabapentin 100 MG capsule Commonly known as: NEURONTIN Take 2 capsules (200 mg total) by mouth 2 (two) times daily.   hydrALAZINE 50 MG tablet Commonly known as: APRESOLINE Take 1 tablet (50 mg total) by mouth every 8 (eight) hours.   hydrOXYzine 25 MG tablet Commonly known as: ATARAX TAKE 1 TABLET(25 MG) BY MOUTH THREE TIMES DAILY AS NEEDED FOR ANXIETY OR NAUSEA   insulin NPH-regular Human (70-30) 100 UNIT/ML injection Inject 8-12 Units into the skin See admin instructions. Taking 12  units in the Am and 8 units at night   iron polysaccharides 150 MG capsule Commonly known as: NIFEREX Take 1 capsule (150 mg total) by mouth daily.   Metoprolol Tartrate 75 MG Tabs Take 75 mg by mouth 2 (two) times daily.   mirtazapine 15 MG tablet Commonly known as:  REMERON Take 1 tablet (15 mg total) by mouth at bedtime.   ondansetron 4 MG disintegrating tablet Commonly known as: ZOFRAN-ODT Take 4 mg by mouth daily.   oxyCODONE 5 MG immediate release tablet Commonly known as: Oxy IR/ROXICODONE Take 1 tablet (5 mg total) by mouth every 6 (six) hours as needed for severe pain or breakthrough pain.   pantoprazole 40 MG tablet Commonly known as: PROTONIX Take  1 tablet (40 mg total) by mouth daily.   PARoxetine 20 MG tablet Commonly known as: PAXIL Take 1 tablet (20 mg total) by mouth at bedtime.   Pentips 32G X 4 MM Misc Generic drug: Insulin Pen Needle use as directed   polyethylene glycol 17 g packet Commonly known as: MIRALAX / GLYCOLAX Take 17 g by mouth daily as needed for moderate constipation, mild constipation or severe constipation.   tamsulosin 0.4 MG Caps capsule Commonly known as: FLOMAX Take 1 capsule (0.4 mg total) by mouth daily after supper.   tobramycin 0.3 % ophthalmic solution Commonly known as: TOBREX Place 1 drop into both eyes as directed. Retina injections   Torsemide 40 MG Tabs Take 40 mg by mouth daily.   traZODone 50 MG tablet Commonly known as: DESYREL Take 50 mg by mouth at bedtime.        Allergies:  Allergies  Allergen Reactions   Cranberry Itching   Hm Lidocaine Patch [Lidocaine] Dermatitis    Blisters skin    Melatonin Other (See Comments)    nightmares   Nsaids Other (See Comments)    Stage 3 kidney disease   Trulicity [Dulaglutide] Other (See Comments)    Night sweats  Uncontrolled tempeture     Past Medical History:  Diagnosis Date   AKI (acute kidney injury) ([place])    GAD (generalized anxiety disorder)    Hyperlipidemia    Macular degeneration, bilateral    Retinopathy    Type II diabetes mellitus with complication, uncontrolled    retinopathy, neuropathy, microalbuminuria    Past Surgical History:  Procedure Laterality Date   APPENDECTOMY     BIOPSY  08/10/2021    Procedure: BIOPSY;  Surgeon: Otis Brace, MD;  Location: [place];  Service: Gastroenterology;;   BUBBLE STUDY  07/29/2021   Procedure: BUBBLE STUDY;  Surgeon: Jerline Pain, MD;  Location: [place] [facility];  Service: Cardiovascular;;   ESOPHAGOGASTRODUODENOSCOPY (EGD) WITH PROPOFOL N/A 08/10/2021   Procedure: ESOPHAGOGASTRODUODENOSCOPY (EGD) WITH PROPOFOL;  Surgeon: Otis Brace, MD;  Location: [place];  Service: Gastroenterology;  Laterality: N/A;   ESOPHAGOGASTRODUODENOSCOPY (EGD) WITH PROPOFOL N/A 08/24/2021   Procedure: ESOPHAGOGASTRODUODENOSCOPY (EGD) WITH PROPOFOL;  Surgeon: Otis Brace, MD;  Location: [place];  Service: Gastroenterology;  Laterality: N/A;   HERNIA REPAIR     IR FLUORO GUIDED NEEDLE PLC ASPIRATION/INJECTION LOC  07/28/2021   LUMBAR LAMINECTOMY/DECOMPRESSION MICRODISCECTOMY N/A 08/07/2021   Procedure: THORACIC FOUR - THORACIC FIVE LAMINECTOMY/DECOMPRESSION OF SPINAL CORD, DEBRIDEMENT OF ABSCESS, MICRODISCECTOMY, INTRAOPERATIVE ULTRASOUND;  Surgeon: Consuella Lose, MD;  Location: [place];  Service: Neurosurgery;  Laterality: N/A;   TEE WITHOUT CARDIOVERSION N/A 07/29/2021   Procedure: TRANSESOPHAGEAL ECHOCARDIOGRAM (TEE);  Surgeon: Jerline Pain, MD;  Location: [facility];  Service: Cardiovascular;  Laterality: N/A;   TRIGGER FINGER RELEASE Right 10/25/2019   Procedure: RIGHT INDEX FINGER RELEASE TRIGGER FINGER/A-1 PULLEY;  Surgeon: Daryll Brod, MD;  Location: [place];  Service: Orthopedics;  Laterality: Right;  IV REGIONAL FOREARM BLOCK    Family History  Problem Relation Age of Onset   Diabetes Mother    Hyperlipidemia Mother    Stroke Mother    Diabetes Father    Hyperlipidemia Brother    Stroke Brother    ADD / ADHD Brother    ADD / ADHD Son     Social History:  reports that he quit smoking about 2 years ago. His smoking use included cigarettes. He has a 20.00 pack-year smoking history. He has never used  smokeless tobacco. He reports that he does not currently use alcohol. He reports that he does not use drugs.  Review of Systems:  He is having intermittent nausea and is due to see gastroenterologist  Last diabetic eye exam date 1/21  Last foot exam date: 8/22  Hypertension:   Treatment does not include ACE inhibitor or ARB, followed by nephrologist  BP Readings from Last 3 Encounters:  08/10/22 130/74  07/05/22 (!) 147/78  06/22/22 (!) 148/68   Lab Results  Component Value Date   CREATININE 3.75 (H) 07/05/2022   CREATININE 3.90 (H) 07/04/2022   CREATININE 3.73 (H) 07/03/2022     Lipid management: Managed with atorvastatin 40 mg daily    Lab Results  Component Value Date   CHOL 116 11/22/2019   CHOL 110 07/20/2019   CHOL 101 12/12/2018   Lab Results  Component Value Date   HDL 42 11/22/2019   HDL 38 (L) 07/20/2019   HDL 47 12/12/2018   Lab Results  Component Value Date   LDLCALC 57 11/22/2019   LDLCALC 46 07/20/2019   LDLCALC 40 12/12/2018   Lab Results  Component Value Date   TRIG 174 (H) 08/17/2021   TRIG 181 (H) 08/14/2021   TRIG 140 05/18/2021   Lab Results  Component Value Date   CHOLHDL 2.8 11/22/2019   CHOLHDL 2.9 07/20/2019   CHOLHDL 2.1 12/12/2018   Lab Results  Component Value Date   LDLDIRECT 115.0 04/23/2022     Examination:   BP 130/74   Pulse 74   Ht 5\' 11"  (1.803 m)   Wt 203 lb 9.6 oz (92.4 kg)   SpO2 98%   BMI 28.40 kg/m   Body mass index is 28.4 kg/m.    ASSESSMENT/ PLAN:    Diabetes type 2 on insulin:   Current regimen: 70/30 insulin twice a day  See history of present illness for detailed discussion of current diabetes management, blood sugar patterns and problems identified  A1c is last 7.7  His blood sugars are overall much higher and may be partly after starting dialysis Blood sugars are markedly increased in the evenings and after dinner with taking only 8 units at dinnertime Also is doing more complete  monitoring with the [place] 3 system now   Recommendations:  Increase evening insulin to at least 14 units and continue to increase by 2 units at a time every 5 days until morning sugars are below at least 150 If he has significant variability in his blood sugars or hypoglycemia may switch to basal bolus insulin regimen separately Hold off on Mounjaro till he has GI evaluation in case he has gastroparesis Consider follow-up with diabetes educator for meal planning review Discussed blood sugar targets at various times and adjustment of insulin as well as the actions of premixed insulin  Patient Instructions  10 units am and 14 in pm before supper  Take 2 U more in pm if am sugar stays >150    Total visit including counseling = 30 minutes  Elayne Snare 08/10/2022, 9:18 PM

## 2022-08-11 ENCOUNTER — Encounter: Payer: Self-pay | Admitting: Endocrinology

## 2022-08-11 ENCOUNTER — Encounter: Payer: Self-pay | Admitting: Vascular Surgery

## 2022-08-11 ENCOUNTER — Other Ambulatory Visit: Payer: Self-pay

## 2022-08-11 ENCOUNTER — Ambulatory Visit (INDEPENDENT_AMBULATORY_CARE_PROVIDER_SITE_OTHER): Payer: 59 | Admitting: Vascular Surgery

## 2022-08-11 VITALS — BP 89/55 | HR 67 | Temp 97.7°F | Resp 20 | Ht 71.0 in | Wt 202.0 lb

## 2022-08-11 DIAGNOSIS — N186 End stage renal disease: Secondary | ICD-10-CM

## 2022-08-11 NOTE — Progress Notes (Signed)
Patient ID: Jose [name], male   DOB: [birth year], 46 y.o.   MRN: [id number]  Reason for Consult: New Patient (Initial Visit)   Referred by Rexene Agent, MD  Subjective:     HPI:  Jose [name] is a 46 y.o. male now with end-stage renal disease on dialysis via right IJ tunneled dialysis catheter.  He does have diabetes also has a history of spinal osteomyelitis.  States that in the past month they removed about 30 pounds with dialysis.  Currently dialyzing Monday, Tuesday, Thursday, Friday off only on Wednesdays.  Does not take any blood thinners.  Previous abdominal surgery includes appendectomy and hernia repair years ago.  No exploratory laparotomies or other large surgeries.  Patient works for [employer].  Past Medical History:  Diagnosis Date   AKI (acute kidney injury) ([place])    GAD (generalized anxiety disorder)    Hyperlipidemia    Macular degeneration, bilateral    Retinopathy    Type II diabetes mellitus with complication, uncontrolled    retinopathy, neuropathy, microalbuminuria   Family History  Problem Relation Age of Onset   Diabetes Mother    Hyperlipidemia Mother    Stroke Mother    Diabetes Father    Hyperlipidemia Brother    Stroke Brother    ADD / ADHD Brother    ADD / ADHD Son    Past Surgical History:  Procedure Laterality Date   APPENDECTOMY     BIOPSY  08/10/2021   Procedure: BIOPSY;  Surgeon: Otis Brace, MD;  Location: [facility];  Service: Gastroenterology;;   BUBBLE STUDY  07/29/2021   Procedure: BUBBLE STUDY;  Surgeon: Jerline Pain, MD;  Location: [place] [facility];  Service: Cardiovascular;;   ESOPHAGOGASTRODUODENOSCOPY (EGD) WITH PROPOFOL N/A 08/10/2021   Procedure: ESOPHAGOGASTRODUODENOSCOPY (EGD) WITH PROPOFOL;  Surgeon: Otis Brace, MD;  Location: [facility];  Service: Gastroenterology;  Laterality: N/A;   ESOPHAGOGASTRODUODENOSCOPY (EGD) WITH PROPOFOL N/A 08/24/2021   Procedure: ESOPHAGOGASTRODUODENOSCOPY (EGD) WITH  PROPOFOL;  Surgeon: Otis Brace, MD;  Location: [place];  Service: Gastroenterology;  Laterality: N/A;   HERNIA REPAIR     IR FLUORO GUIDED NEEDLE PLC ASPIRATION/INJECTION LOC  07/28/2021   LUMBAR LAMINECTOMY/DECOMPRESSION MICRODISCECTOMY N/A 08/07/2021   Procedure: THORACIC FOUR - THORACIC FIVE LAMINECTOMY/DECOMPRESSION OF SPINAL CORD, DEBRIDEMENT OF ABSCESS, MICRODISCECTOMY, INTRAOPERATIVE ULTRASOUND;  Surgeon: Consuella Lose, MD;  Location: [place];  Service: Neurosurgery;  Laterality: N/A;   TEE WITHOUT CARDIOVERSION N/A 07/29/2021   Procedure: TRANSESOPHAGEAL ECHOCARDIOGRAM (TEE);  Surgeon: Jerline Pain, MD;  Location: [facility];  Service: Cardiovascular;  Laterality: N/A;   TRIGGER FINGER RELEASE Right 10/25/2019   Procedure: RIGHT INDEX FINGER RELEASE TRIGGER FINGER/A-1 PULLEY;  Surgeon: Daryll Brod, MD;  Location: [place];  Service: Orthopedics;  Laterality: Right;  IV REGIONAL FOREARM BLOCK    Short Social History:  Social History   Tobacco Use   Smoking status: Former    Packs/day: 1.00    Years: 20.00    Total pack years: 20.00    Types: Cigarettes    Quit date: 07/2020    Years since quitting: 2.0   Smokeless tobacco: Never  Substance Use Topics   Alcohol use: Not Currently    Allergies  Allergen Reactions   Cranberry Itching   Hm Lidocaine Patch [Lidocaine] Dermatitis    Blisters skin    Melatonin Other (See Comments)    nightmares   Nsaids Other (See Comments)    Stage 3 kidney disease   Trulicity [Dulaglutide]  Other (See Comments)    Night sweats  Uncontrolled tempeture     Current Outpatient Medications  Medication Sig Dispense Refill   Acetaminophen 500 MG capsule Take 500-1,000 mg by mouth every 6 (six) hours as needed for mild pain.     albuterol (VENTOLIN HFA) 108 (90 Base) MCG/ACT inhaler Inhale 1 puff into the lungs every 4 (four) hours as needed for wheezing or shortness of breath.     ALPRAZolam (XANAX) 0.25 MG  tablet Take 0.25 mg by mouth 2 (two) times daily as needed.     atorvastatin (LIPITOR) 40 MG tablet Take 40 mg by mouth daily.     bethanechol (URECHOLINE) 10 MG tablet Take 10 mg by mouth 3 (three) times daily.     cholecalciferol (VITAMIN D3) 25 MCG (1000 UNIT) tablet Take 1,000 Units by mouth daily.     Continuous Blood Gluc Sensor (FREESTYLE LIBRE 3 SENSOR) MISC 1 Device by Does not apply route every 14 (fourteen) days. Apply 1 sensor on upper arm every 14 days for continuous glucose monitoring 2 each 2   gabapentin (NEURONTIN) 100 MG capsule Take 2 capsules (200 mg total) by mouth 2 (two) times daily. 120 capsule 0   hydrALAZINE (APRESOLINE) 50 MG tablet Take 1 tablet (50 mg total) by mouth every 8 (eight) hours. 150 tablet 0   hydrOXYzine (ATARAX) 25 MG tablet TAKE 1 TABLET(25 MG) BY MOUTH THREE TIMES DAILY AS NEEDED FOR ANXIETY OR NAUSEA 90 tablet 1   insulin NPH-regular Human (70-30) 100 UNIT/ML injection Inject 8-12 Units into the skin See admin instructions. Taking 12  units in the Am and 8 units at night     Insulin Pen Needle (PENTIPS) 32G X 4 MM MISC use as directed 100 each 1   iron polysaccharides (NIFEREX) 150 MG capsule Take 1 capsule (150 mg total) by mouth daily. 30 capsule 0   Metoprolol Tartrate 75 MG TABS Take 75 mg by mouth 2 (two) times daily. 60 tablet 0   mirtazapine (REMERON) 15 MG tablet Take 1 tablet (15 mg total) by mouth at bedtime. 30 tablet 0   ondansetron (ZOFRAN-ODT) 4 MG disintegrating tablet Take 4 mg by mouth daily.     oxyCODONE (OXY IR/ROXICODONE) 5 MG immediate release tablet Take 1 tablet (5 mg total) by mouth every 6 (six) hours as needed for severe pain or breakthrough pain. 15 tablet 0   pantoprazole (PROTONIX) 40 MG tablet Take 1 tablet (40 mg total) by mouth daily. 30 tablet 0   PARoxetine (PAXIL) 20 MG tablet Take 1 tablet (20 mg total) by mouth at bedtime. 30 tablet 0   polyethylene glycol (MIRALAX / GLYCOLAX) 17 g packet Take 17 g by mouth daily as  needed for moderate constipation, mild constipation or severe constipation. 14 each 0   tamsulosin (FLOMAX) 0.4 MG CAPS capsule Take 1 capsule (0.4 mg total) by mouth daily after supper. 30 capsule 0   tobramycin (TOBREX) 0.3 % ophthalmic solution Place 1 drop into both eyes as directed. Retina injections     torsemide 40 MG TABS Take 40 mg by mouth daily. 30 tablet 0   traZODone (DESYREL) 50 MG tablet Take 50 mg by mouth at bedtime.     amLODipine (NORVASC) 10 MG tablet Take 1 tablet (10 mg total) by mouth daily. (Patient not taking: Reported on 08/11/2022) 30 tablet 0   No current facility-administered medications for this visit.    Review of Systems  Constitutional:  Intentional weight loss  HENT: HENT negative.  Eyes: Eyes negative.  Respiratory: Respiratory negative.  Cardiovascular: Cardiovascular negative.  GI: Gastrointestinal negative.  Musculoskeletal: Musculoskeletal negative.  Skin: Skin negative.  Neurological: Neurological negative. Hematologic: Hematologic/lymphatic negative.  Psychiatric: Psychiatric negative.        Objective:  Objective   Vitals:   08/11/22 0956  BP: (!) 89/55  Pulse: 67  Resp: 20  Temp: 97.7 F (36.5 C)  SpO2: 93%  Weight: 202 lb (91.6 kg)  Height: 5\' 11"  (1.803 m)   Body mass index is 28.17 kg/m.  Physical Exam HENT:     Head: Normocephalic.     Nose: Nose normal.  Eyes:     Pupils: Pupils are equal, round, and reactive to light.  Neck:     Comments: Right IJ tunneled dialysis catheter in place Cardiovascular:     Rate and Rhythm: Normal rate.  Pulmonary:     Effort: Pulmonary effort is normal.  Abdominal:     General: Abdomen is flat.     Palpations: Abdomen is soft.  Musculoskeletal:        General: Normal range of motion.     Right lower leg: No edema.     Left lower leg: No edema.  Skin:    General: Skin is warm.     Capillary Refill: Capillary refill takes less than 2 seconds.  Neurological:     General:  No focal deficit present.     Mental Status: He is alert.  Psychiatric:        Mood and Affect: Mood normal.     Data: No studies     Assessment/Plan:    46 year old male now with end-stage renal disease on dialysis via tunneled dialysis catheter.  We discussed the risk benefits of tunneled dialysis catheter including the risk of infection and primary nonfunction and he demonstrates good understanding we will get him scheduled in the near future.     Waynetta Sandy MD [facility]

## 2022-08-11 NOTE — H&P (View-Only) (Signed)
Patient ID: Jose [name], male   DOB: [birth year], 46 y.o.   MRN: [id number]  Reason for Consult: New Patient (Initial Visit)   Referred by Rexene Agent, MD  Subjective:     HPI:  Jose [name] is a 46 y.o. male now with end-stage renal disease on dialysis via right IJ tunneled dialysis catheter.  He does have diabetes also has a history of spinal osteomyelitis.  States that in the past month they removed about 30 pounds with dialysis.  Currently dialyzing Monday, Tuesday, Thursday, Friday off only on Wednesdays.  Does not take any blood thinners.  Previous abdominal surgery includes appendectomy and hernia repair years ago.  No exploratory laparotomies or other large surgeries.  Patient works for [employer].  Past Medical History:  Diagnosis Date   AKI (acute kidney injury) ([place])    GAD (generalized anxiety disorder)    Hyperlipidemia    Macular degeneration, bilateral    Retinopathy    Type II diabetes mellitus with complication, uncontrolled    retinopathy, neuropathy, microalbuminuria   Family History  Problem Relation Age of Onset   Diabetes Mother    Hyperlipidemia Mother    Stroke Mother    Diabetes Father    Hyperlipidemia Brother    Stroke Brother    ADD / ADHD Brother    ADD / ADHD Son    Past Surgical History:  Procedure Laterality Date   APPENDECTOMY     BIOPSY  08/10/2021   Procedure: BIOPSY;  Surgeon: Otis Brace, MD;  Location: [facility];  Service: Gastroenterology;;   BUBBLE STUDY  07/29/2021   Procedure: BUBBLE STUDY;  Surgeon: Jerline Pain, MD;  Location: [place] [facility];  Service: Cardiovascular;;   ESOPHAGOGASTRODUODENOSCOPY (EGD) WITH PROPOFOL N/A 08/10/2021   Procedure: ESOPHAGOGASTRODUODENOSCOPY (EGD) WITH PROPOFOL;  Surgeon: Otis Brace, MD;  Location: [facility];  Service: Gastroenterology;  Laterality: N/A;   ESOPHAGOGASTRODUODENOSCOPY (EGD) WITH PROPOFOL N/A 08/24/2021   Procedure: ESOPHAGOGASTRODUODENOSCOPY (EGD) WITH  PROPOFOL;  Surgeon: Otis Brace, MD;  Location: [place];  Service: Gastroenterology;  Laterality: N/A;   HERNIA REPAIR     IR FLUORO GUIDED NEEDLE PLC ASPIRATION/INJECTION LOC  07/28/2021   LUMBAR LAMINECTOMY/DECOMPRESSION MICRODISCECTOMY N/A 08/07/2021   Procedure: THORACIC FOUR - THORACIC FIVE LAMINECTOMY/DECOMPRESSION OF SPINAL CORD, DEBRIDEMENT OF ABSCESS, MICRODISCECTOMY, INTRAOPERATIVE ULTRASOUND;  Surgeon: Consuella Lose, MD;  Location: [place];  Service: Neurosurgery;  Laterality: N/A;   TEE WITHOUT CARDIOVERSION N/A 07/29/2021   Procedure: TRANSESOPHAGEAL ECHOCARDIOGRAM (TEE);  Surgeon: Jerline Pain, MD;  Location: [facility];  Service: Cardiovascular;  Laterality: N/A;   TRIGGER FINGER RELEASE Right 10/25/2019   Procedure: RIGHT INDEX FINGER RELEASE TRIGGER FINGER/A-1 PULLEY;  Surgeon: Daryll Brod, MD;  Location: [place];  Service: Orthopedics;  Laterality: Right;  IV REGIONAL FOREARM BLOCK    Short Social History:  Social History   Tobacco Use   Smoking status: Former    Packs/day: 1.00    Years: 20.00    Total pack years: 20.00    Types: Cigarettes    Quit date: 07/2020    Years since quitting: 2.0   Smokeless tobacco: Never  Substance Use Topics   Alcohol use: Not Currently    Allergies  Allergen Reactions   Cranberry Itching   Hm Lidocaine Patch [Lidocaine] Dermatitis    Blisters skin    Melatonin Other (See Comments)    nightmares   Nsaids Other (See Comments)    Stage 3 kidney disease   Trulicity [Dulaglutide]  Other (See Comments)    Night sweats  Uncontrolled tempeture     Current Outpatient Medications  Medication Sig Dispense Refill   Acetaminophen 500 MG capsule Take 500-1,000 mg by mouth every 6 (six) hours as needed for mild pain.     albuterol (VENTOLIN HFA) 108 (90 Base) MCG/ACT inhaler Inhale 1 puff into the lungs every 4 (four) hours as needed for wheezing or shortness of breath.     ALPRAZolam (XANAX) 0.25 MG  tablet Take 0.25 mg by mouth 2 (two) times daily as needed.     atorvastatin (LIPITOR) 40 MG tablet Take 40 mg by mouth daily.     bethanechol (URECHOLINE) 10 MG tablet Take 10 mg by mouth 3 (three) times daily.     cholecalciferol (VITAMIN D3) 25 MCG (1000 UNIT) tablet Take 1,000 Units by mouth daily.     Continuous Blood Gluc Sensor (FREESTYLE LIBRE 3 SENSOR) MISC 1 Device by Does not apply route every 14 (fourteen) days. Apply 1 sensor on upper arm every 14 days for continuous glucose monitoring 2 each 2   gabapentin (NEURONTIN) 100 MG capsule Take 2 capsules (200 mg total) by mouth 2 (two) times daily. 120 capsule 0   hydrALAZINE (APRESOLINE) 50 MG tablet Take 1 tablet (50 mg total) by mouth every 8 (eight) hours. 150 tablet 0   hydrOXYzine (ATARAX) 25 MG tablet TAKE 1 TABLET(25 MG) BY MOUTH THREE TIMES DAILY AS NEEDED FOR ANXIETY OR NAUSEA 90 tablet 1   insulin NPH-regular Human (70-30) 100 UNIT/ML injection Inject 8-12 Units into the skin See admin instructions. Taking 12  units in the Am and 8 units at night     Insulin Pen Needle (PENTIPS) 32G X 4 MM MISC use as directed 100 each 1   iron polysaccharides (NIFEREX) 150 MG capsule Take 1 capsule (150 mg total) by mouth daily. 30 capsule 0   Metoprolol Tartrate 75 MG TABS Take 75 mg by mouth 2 (two) times daily. 60 tablet 0   mirtazapine (REMERON) 15 MG tablet Take 1 tablet (15 mg total) by mouth at bedtime. 30 tablet 0   ondansetron (ZOFRAN-ODT) 4 MG disintegrating tablet Take 4 mg by mouth daily.     oxyCODONE (OXY IR/ROXICODONE) 5 MG immediate release tablet Take 1 tablet (5 mg total) by mouth every 6 (six) hours as needed for severe pain or breakthrough pain. 15 tablet 0   pantoprazole (PROTONIX) 40 MG tablet Take 1 tablet (40 mg total) by mouth daily. 30 tablet 0   PARoxetine (PAXIL) 20 MG tablet Take 1 tablet (20 mg total) by mouth at bedtime. 30 tablet 0   polyethylene glycol (MIRALAX / GLYCOLAX) 17 g packet Take 17 g by mouth daily as  needed for moderate constipation, mild constipation or severe constipation. 14 each 0   tamsulosin (FLOMAX) 0.4 MG CAPS capsule Take 1 capsule (0.4 mg total) by mouth daily after supper. 30 capsule 0   tobramycin (TOBREX) 0.3 % ophthalmic solution Place 1 drop into both eyes as directed. Retina injections     torsemide 40 MG TABS Take 40 mg by mouth daily. 30 tablet 0   traZODone (DESYREL) 50 MG tablet Take 50 mg by mouth at bedtime.     amLODipine (NORVASC) 10 MG tablet Take 1 tablet (10 mg total) by mouth daily. (Patient not taking: Reported on 08/11/2022) 30 tablet 0   No current facility-administered medications for this visit.    Review of Systems  Constitutional:  Intentional weight loss  HENT: HENT negative.  Eyes: Eyes negative.  Respiratory: Respiratory negative.  Cardiovascular: Cardiovascular negative.  GI: Gastrointestinal negative.  Musculoskeletal: Musculoskeletal negative.  Skin: Skin negative.  Neurological: Neurological negative. Hematologic: Hematologic/lymphatic negative.  Psychiatric: Psychiatric negative.        Objective:  Objective   Vitals:   08/11/22 0956  BP: (!) 89/55  Pulse: 67  Resp: 20  Temp: 97.7 F (36.5 C)  SpO2: 93%  Weight: 202 lb (91.6 kg)  Height: 5\' 11"  (1.803 m)   Body mass index is 28.17 kg/m.  Physical Exam HENT:     Head: Normocephalic.     Nose: Nose normal.  Eyes:     Pupils: Pupils are equal, round, and reactive to light.  Neck:     Comments: Right IJ tunneled dialysis catheter in place Cardiovascular:     Rate and Rhythm: Normal rate.  Pulmonary:     Effort: Pulmonary effort is normal.  Abdominal:     General: Abdomen is flat.     Palpations: Abdomen is soft.  Musculoskeletal:        General: Normal range of motion.     Right lower leg: No edema.     Left lower leg: No edema.  Skin:    General: Skin is warm.     Capillary Refill: Capillary refill takes less than 2 seconds.  Neurological:     General:  No focal deficit present.     Mental Status: He is alert.  Psychiatric:        Mood and Affect: Mood normal.     Data: No studies     Assessment/Plan:    46 year old male now with end-stage renal disease on dialysis via tunneled dialysis catheter.  We discussed the risk benefits of tunneled dialysis catheter including the risk of infection and primary nonfunction and he demonstrates good understanding we will get him scheduled in the near future.     Waynetta Sandy MD [facility]

## 2022-08-19 ENCOUNTER — Emergency Department (HOSPITAL_COMMUNITY): Payer: 59

## 2022-08-19 ENCOUNTER — Other Ambulatory Visit: Payer: Self-pay

## 2022-08-19 ENCOUNTER — Observation Stay (HOSPITAL_COMMUNITY)
Admission: EM | Admit: 2022-08-19 | Discharge: 2022-08-21 | Disposition: A | Discharge status: Home / Self Care | Payer: 59 | Attending: Family Medicine | Admitting: Family Medicine

## 2022-08-19 ENCOUNTER — Encounter (HOSPITAL_COMMUNITY): Payer: Self-pay

## 2022-08-19 DIAGNOSIS — Z79899 Other long term (current) drug therapy: Secondary | ICD-10-CM | POA: Diagnosis not present

## 2022-08-19 DIAGNOSIS — R112 Nausea with vomiting, unspecified: Principal | ICD-10-CM | POA: Insufficient documentation

## 2022-08-19 DIAGNOSIS — R2689 Other abnormalities of gait and mobility: Secondary | ICD-10-CM

## 2022-08-19 DIAGNOSIS — Z992 Dependence on renal dialysis: Secondary | ICD-10-CM | POA: Insufficient documentation

## 2022-08-19 DIAGNOSIS — D638 Anemia in other chronic diseases classified elsewhere: Secondary | ICD-10-CM | POA: Diagnosis present

## 2022-08-19 DIAGNOSIS — R748 Abnormal levels of other serum enzymes: Secondary | ICD-10-CM | POA: Insufficient documentation

## 2022-08-19 DIAGNOSIS — D631 Anemia in chronic kidney disease: Secondary | ICD-10-CM | POA: Insufficient documentation

## 2022-08-19 DIAGNOSIS — Z794 Long term (current) use of insulin: Secondary | ICD-10-CM | POA: Diagnosis not present

## 2022-08-19 DIAGNOSIS — I120 Hypertensive chronic kidney disease with stage 5 chronic kidney disease or end stage renal disease: Secondary | ICD-10-CM | POA: Insufficient documentation

## 2022-08-19 DIAGNOSIS — R197 Diarrhea, unspecified: Secondary | ICD-10-CM | POA: Diagnosis not present

## 2022-08-19 DIAGNOSIS — A419 Sepsis, unspecified organism: Secondary | ICD-10-CM | POA: Insufficient documentation

## 2022-08-19 DIAGNOSIS — N186 End stage renal disease: Secondary | ICD-10-CM | POA: Insufficient documentation

## 2022-08-19 DIAGNOSIS — R0602 Shortness of breath: Secondary | ICD-10-CM | POA: Diagnosis present

## 2022-08-19 DIAGNOSIS — K8020 Calculus of gallbladder without cholecystitis without obstruction: Secondary | ICD-10-CM | POA: Diagnosis not present

## 2022-08-19 DIAGNOSIS — D649 Anemia, unspecified: Secondary | ICD-10-CM | POA: Diagnosis present

## 2022-08-19 DIAGNOSIS — Z1152 Encounter for screening for COVID-19: Secondary | ICD-10-CM | POA: Insufficient documentation

## 2022-08-19 DIAGNOSIS — E1122 Type 2 diabetes mellitus with diabetic chronic kidney disease: Secondary | ICD-10-CM | POA: Insufficient documentation

## 2022-08-19 LAB — URINALYSIS, ROUTINE W REFLEX MICROSCOPIC
Bacteria, UA: NONE SEEN
Bilirubin Urine: NEGATIVE
Glucose, UA: 150 mg/dL — AB
Hgb urine dipstick: NEGATIVE
Ketones, ur: NEGATIVE mg/dL
Leukocytes,Ua: NEGATIVE
Nitrite: NEGATIVE
Protein, ur: >=300 mg/dL — AB
Specific Gravity, Urine: 1.017 (ref 1.005–1.030)
pH: 5 (ref 5.0–8.0)

## 2022-08-19 LAB — CBC WITH DIFFERENTIAL/PLATELET
Abs Immature Granulocytes: 0.05 10*3/uL (ref 0.00–0.07)
Basophils Absolute: 0.1 10*3/uL (ref 0.0–0.1)
Basophils Relative: 1 %
Eosinophils Absolute: 0.1 10*3/uL (ref 0.0–0.5)
Eosinophils Relative: 1 %
HCT: 29.1 % — ABNORMAL LOW (ref 39.0–52.0)
Hemoglobin: 9.9 g/dL — ABNORMAL LOW (ref 13.0–17.0)
Immature Granulocytes: 1 %
Lymphocytes Relative: 11 %
Lymphs Abs: 1.3 10*3/uL (ref 0.7–4.0)
MCH: 29.7 pg (ref 26.0–34.0)
MCHC: 34 g/dL (ref 30.0–36.0)
MCV: 87.4 fL (ref 80.0–100.0)
Monocytes Absolute: 1 10*3/uL (ref 0.1–1.0)
Monocytes Relative: 9 %
Neutro Abs: 8.6 10*3/uL — ABNORMAL HIGH (ref 1.7–7.7)
Neutrophils Relative %: 77 %
Platelets: 308 10*3/uL (ref 150–400)
RBC: 3.33 MIL/uL — ABNORMAL LOW (ref 4.22–5.81)
RDW: 13.6 % (ref 11.5–15.5)
WBC: 11 10*3/uL — ABNORMAL HIGH (ref 4.0–10.5)
nRBC: 0 % (ref 0.0–0.2)

## 2022-08-19 LAB — GLUCOSE, CAPILLARY
Glucose-Capillary: 124 mg/dL — ABNORMAL HIGH (ref 70–99)
Glucose-Capillary: 141 mg/dL — ABNORMAL HIGH (ref 70–99)

## 2022-08-19 LAB — PROTIME-INR
INR: 1.2 (ref 0.8–1.2)
Prothrombin Time: 14.6 seconds (ref 11.4–15.2)

## 2022-08-19 LAB — APTT: aPTT: 30 seconds (ref 24–36)

## 2022-08-19 LAB — COMPREHENSIVE METABOLIC PANEL
ALT: 26 U/L (ref 0–44)
AST: 27 U/L (ref 15–41)
Albumin: 4 g/dL (ref 3.5–5.0)
Alkaline Phosphatase: 270 U/L — ABNORMAL HIGH (ref 38–126)
Anion gap: 16 — ABNORMAL HIGH (ref 5–15)
BUN: 22 mg/dL — ABNORMAL HIGH (ref 6–20)
CO2: 26 mmol/L (ref 22–32)
Calcium: 9.2 mg/dL (ref 8.9–10.3)
Chloride: 94 mmol/L — ABNORMAL LOW (ref 98–111)
Creatinine, Ser: 4.46 mg/dL — ABNORMAL HIGH (ref 0.61–1.24)
GFR, Estimated: 15 mL/min — ABNORMAL LOW (ref >60)
Glucose, Bld: 248 mg/dL — ABNORMAL HIGH (ref 70–99)
Potassium: 3.7 mmol/L (ref 3.5–5.1)
Sodium: 136 mmol/L (ref 135–145)
Total Bilirubin: 0.8 mg/dL (ref 0.3–1.2)
Total Protein: 7.4 g/dL (ref 6.5–8.1)

## 2022-08-19 LAB — LACTIC ACID, PLASMA: Lactic Acid, Venous: 1.4 mmol/L (ref 0.5–1.9)

## 2022-08-19 LAB — MAGNESIUM: Magnesium: 1.9 mg/dL (ref 1.7–2.4)

## 2022-08-19 LAB — I-STAT CHEM 8, ED
BUN: 23 mg/dL — ABNORMAL HIGH (ref 6–20)
Calcium, Ion: 1.1 mmol/L — ABNORMAL LOW (ref 1.15–1.40)
Chloride: 94 mmol/L — ABNORMAL LOW (ref 98–111)
Creatinine, Ser: 4.6 mg/dL — ABNORMAL HIGH (ref 0.61–1.24)
Glucose, Bld: 252 mg/dL — ABNORMAL HIGH (ref 70–99)
HCT: 30 % — ABNORMAL LOW (ref 39.0–52.0)
Hemoglobin: 10.2 g/dL — ABNORMAL LOW (ref 13.0–17.0)
Potassium: 3.7 mmol/L (ref 3.5–5.1)
Sodium: 137 mmol/L (ref 135–145)
TCO2: 30 mmol/L (ref 22–32)

## 2022-08-19 LAB — LIPASE, BLOOD: Lipase: 91 U/L — ABNORMAL HIGH (ref 11–51)

## 2022-08-19 LAB — RESP PANEL BY RT-PCR (FLU A&B, COVID) ARPGX2
Influenza A by PCR: NEGATIVE
Influenza B by PCR: NEGATIVE
SARS Coronavirus 2 by RT PCR: NEGATIVE

## 2022-08-19 LAB — BRAIN NATRIURETIC PEPTIDE: B Natriuretic Peptide: 91.2 pg/mL (ref 0.0–100.0)

## 2022-08-19 LAB — PHOSPHORUS: Phosphorus: 4 mg/dL (ref 2.5–4.6)

## 2022-08-19 LAB — MRSA NEXT GEN BY PCR, NASAL: MRSA by PCR Next Gen: NOT DETECTED

## 2022-08-19 MED ORDER — TAMSULOSIN HCL 0.4 MG PO CAPS
0.4000 mg | ORAL_CAPSULE | Freq: Every day | ORAL | Status: DC
  Administered 2022-08-20: 0.4 mg via ORAL
  Filled 2022-08-19: qty 1

## 2022-08-19 MED ORDER — SODIUM CHLORIDE 0.9 % IV SOLN
1.0000 g | INTRAVENOUS | Status: DC
  Filled 2022-08-19: qty 10

## 2022-08-19 MED ORDER — TRAZODONE HCL 50 MG PO TABS
100.0000 mg | ORAL_TABLET | Freq: Every evening | ORAL | Status: DC | PRN
  Administered 2022-08-19: 100 mg via ORAL
  Filled 2022-08-19: qty 2

## 2022-08-19 MED ORDER — SODIUM CHLORIDE 0.9 % IV SOLN
12.5000 mg | Freq: Once | INTRAVENOUS | Status: AC
  Administered 2022-08-19: 12.5 mg via INTRAVENOUS
  Filled 2022-08-19 (×2): qty 0.5

## 2022-08-19 MED ORDER — METOCLOPRAMIDE HCL 5 MG/ML IJ SOLN
5.0000 mg | Freq: Once | INTRAMUSCULAR | Status: AC
  Administered 2022-08-19: 5 mg via INTRAVENOUS
  Filled 2022-08-19: qty 2

## 2022-08-19 MED ORDER — INSULIN ASPART PROT & ASPART (70-30 MIX) 100 UNIT/ML ~~LOC~~ SUSP
5.0000 [IU] | Freq: Every day | SUBCUTANEOUS | Status: DC
  Administered 2022-08-20 – 2022-08-21 (×2): 5 [IU] via SUBCUTANEOUS
  Filled 2022-08-19: qty 10

## 2022-08-19 MED ORDER — PANTOPRAZOLE SODIUM 40 MG PO TBEC
40.0000 mg | DELAYED_RELEASE_TABLET | Freq: Every day | ORAL | Status: DC
  Administered 2022-08-20 – 2022-08-21 (×2): 40 mg via ORAL
  Filled 2022-08-19 (×2): qty 1

## 2022-08-19 MED ORDER — HEPARIN SODIUM (PORCINE) 5000 UNIT/ML IJ SOLN
5000.0000 [IU] | Freq: Three times a day (TID) | INTRAMUSCULAR | Status: DC
  Administered 2022-08-19 – 2022-08-21 (×5): 5000 [IU] via SUBCUTANEOUS
  Filled 2022-08-19 (×5): qty 1

## 2022-08-19 MED ORDER — VANCOMYCIN HCL 2000 MG/400ML IV SOLN
2000.0000 mg | Freq: Once | INTRAVENOUS | Status: AC
  Administered 2022-08-19: 2000 mg via INTRAVENOUS
  Filled 2022-08-19: qty 400

## 2022-08-19 MED ORDER — ACETAMINOPHEN 500 MG PO TABS
1000.0000 mg | ORAL_TABLET | Freq: Four times a day (QID) | ORAL | Status: DC | PRN
  Administered 2022-08-19 – 2022-08-20 (×3): 1000 mg via ORAL
  Filled 2022-08-19 (×3): qty 2

## 2022-08-19 MED ORDER — MORPHINE SULFATE (PF) 4 MG/ML IV SOLN
4.0000 mg | Freq: Once | INTRAVENOUS | Status: AC
  Administered 2022-08-19: 4 mg via INTRAVENOUS
  Filled 2022-08-19: qty 1

## 2022-08-19 MED ORDER — HYDROXYZINE HCL 25 MG PO TABS
25.0000 mg | ORAL_TABLET | Freq: Three times a day (TID) | ORAL | Status: DC | PRN
  Administered 2022-08-19: 25 mg via ORAL
  Filled 2022-08-19: qty 1

## 2022-08-19 MED ORDER — VANCOMYCIN VARIABLE DOSE PER UNSTABLE RENAL FUNCTION (PHARMACIST DOSING): Status: DC

## 2022-08-19 MED ORDER — SODIUM CHLORIDE 0.9 % IV BOLUS (SEPSIS)
500.0000 mL | Freq: Once | INTRAVENOUS | Status: AC
  Administered 2022-08-19: 500 mL via INTRAVENOUS

## 2022-08-19 MED ORDER — POLYSACCHARIDE IRON COMPLEX 150 MG PO CAPS
150.0000 mg | ORAL_CAPSULE | Freq: Every day | ORAL | Status: DC
  Administered 2022-08-20 – 2022-08-21 (×2): 150 mg via ORAL
  Filled 2022-08-19 (×3): qty 1

## 2022-08-19 MED ORDER — INSULIN ASPART PROT & ASPART (70-30 MIX) 100 UNIT/ML ~~LOC~~ SUSP
7.0000 [IU] | Freq: Every day | SUBCUTANEOUS | Status: DC
  Administered 2022-08-20: 7 [IU] via SUBCUTANEOUS
  Filled 2022-08-19: qty 10

## 2022-08-19 MED ORDER — VANCOMYCIN HCL IN DEXTROSE 1-5 GM/200ML-% IV SOLN
1000.0000 mg | Freq: Once | INTRAVENOUS | Status: DC
  Filled 2022-08-19: qty 200

## 2022-08-19 MED ORDER — SODIUM CHLORIDE 0.9 % IV SOLN
2.0000 g | Freq: Once | INTRAVENOUS | Status: AC
  Administered 2022-08-19: 2 g via INTRAVENOUS
  Filled 2022-08-19: qty 12.5

## 2022-08-19 MED ORDER — PAROXETINE HCL 20 MG PO TABS
20.0000 mg | ORAL_TABLET | Freq: Every day | ORAL | Status: DC
  Administered 2022-08-20 (×2): 20 mg via ORAL
  Filled 2022-08-19 (×4): qty 1

## 2022-08-19 MED ORDER — MIRTAZAPINE 15 MG PO TABS
15.0000 mg | ORAL_TABLET | Freq: Every day | ORAL | Status: DC
  Administered 2022-08-19 – 2022-08-20 (×2): 15 mg via ORAL
  Filled 2022-08-19 (×2): qty 1

## 2022-08-19 MED ORDER — ONDANSETRON HCL 4 MG/2ML IJ SOLN
4.0000 mg | Freq: Once | INTRAMUSCULAR | Status: AC
  Administered 2022-08-19: 4 mg via INTRAVENOUS
  Filled 2022-08-19: qty 2

## 2022-08-19 MED ORDER — METOPROLOL TARTRATE 25 MG PO TABS
75.0000 mg | ORAL_TABLET | Freq: Two times a day (BID) | ORAL | Status: DC
  Administered 2022-08-19 – 2022-08-21 (×3): 75 mg via ORAL
  Filled 2022-08-19 (×4): qty 3

## 2022-08-19 NOTE — Progress Notes (Signed)
FMTS Brief Progress Note  S: In to see patient for evening rounds. He complains of his chronic back pain- would like medication for this. States he takes oxycodone at home. Also requests nausea medication. He is not able to tolerate PO currently.  O: BP (!) 149/84 (BP Location: Right Arm)   Pulse (!) 110   Temp 99.5 F (37.5 C) (Oral)   Resp 18   Ht 5\' 11"  (1.803 m)   Wt 88.4 kg   SpO2 96%   BMI 27.18 kg/m   Gen: alert, well-appearing, NAD CV: RRR, normal S1/S2 without m/r/g Resp: breathing comfortably on room air, lungs CTAB Ext: no peripheral edema  A/P: Nausea/Vomiting Wonder whether there is a component of gastroparesis. -Reglan 5mg  IV -Continue to monitor  Back Pain Chronic. PDMP reviewed-- while patient has had oxy in the past, this is not a chronic medication -Tylenol 1000mg  q6h prn   Orders reviewed. Labs for AM ordered, which was adjusted as needed.   Remainder per H&P   Alcus Dad, MD 08/19/2022, 9:45 PM PGY-3, [facility] Night Resident  Please page [phone] with questions.

## 2022-08-19 NOTE — Assessment & Plan Note (Addendum)
Stable.  Hemoglobin 9.2 today. - Continue Niferex - Repeat CBC

## 2022-08-19 NOTE — ED Triage Notes (Signed)
Pt chills since Tuesday  denies fevers c/o of n/v since last week mainly yesterday and last night.  HD M T Thur F pt was at HD today they had not started because his BP was per pt all over the place.  HD stated his dry weight was 2.2 kg and couldn't complete todays

## 2022-08-19 NOTE — ED Provider Notes (Signed)
[facility] EMERGENCY DEPARTMENT Provider Note   CSN: [id number] Arrival date & time: 08/19/22  0856     History  Chief Complaint  Patient presents with   Shortness of Breath    2.2kg   Chills   Nausea   Emesis    Jose [name] is a 46 y.o. male.with pmh CKD on HD T/Th/Sat, HTN, HLD, diabetes mellitus type 2, history of chronic kidney disease stage IV, history of thoracic osteomyelitis/discitis who presents today with persistent chills, nausea, nonbloody nonbilious vomiting and nonbloody diarrhea for the past week.  He said he started feeling unwell about a week ago with generalized nausea and fatigue and then progressed to have persistent nonbloody nonbilious vomiting and persistent loose stools.  He has had some abdominal discomfort but no localizing area.  He is on dialysis and last full dialysis session was this past Tuesday.  He was supposed to have dialysis today but never went through because of his " blood pressures being all over the place."  He has had no pain with urination.  He has had some dry cough but no chest pain, no shortness of breath, no congestion or rhinorrhea.  He does note some increasing back pain in his upper and lower back rates had previous infections before but does not feel like this is similar to previous infections.  He has had no new focal weakness or loss of sensation.  Shortness of Breath Associated symptoms: vomiting   Emesis      Home Medications Prior to Admission medications   Medication Sig Start Date End Date Taking? Authorizing Provider  minoxidil (LONITEN) 2.5 MG tablet Take 2.5 mg by mouth 2 (two) times daily. 07/15/22  Yes [provider]  sevelamer carbonate (RENVELA) 800 MG tablet Take 1,600 mg by mouth 3 (three) times daily. 08/12/22  Yes [provider]  Acetaminophen 500 MG capsule Take 500-1,000 mg by mouth every 6 (six) hours as needed for mild pain.    [provider]  albuterol  (VENTOLIN HFA) 108 (90 Base) MCG/ACT inhaler Inhale 1 puff into the lungs every 4 (four) hours as needed for wheezing or shortness of breath. 05/27/21   [provider]  ALPRAZolam Duanne Moron) 0.25 MG tablet Take 0.25 mg by mouth 2 (two) times daily as needed. 07/22/22   [provider]  amLODipine (NORVASC) 10 MG tablet Take 1 tablet (10 mg total) by mouth daily. Patient not taking: Reported on 08/11/2022 06/09/22   Elgergawy, Silver Huguenin, MD  atorvastatin (LIPITOR) 40 MG tablet Take 40 mg by mouth daily.    [provider]  bethanechol (URECHOLINE) 10 MG tablet Take 10 mg by mouth 3 (three) times daily. 07/22/22   [provider]  cholecalciferol (VITAMIN D3) 25 MCG (1000 UNIT) tablet Take 1,000 Units by mouth daily.    [provider]  Continuous Blood Gluc Sensor (FREESTYLE LIBRE 3 SENSOR) MISC 1 Device by Does not apply route every 14 (fourteen) days. Apply 1 sensor on upper arm every 14 days for continuous glucose monitoring 04/29/22   Elayne Snare, MD  gabapentin (NEURONTIN) 100 MG capsule Take 2 capsules (200 mg total) by mouth 2 (two) times daily. 09/21/21   Love, Ivan Anchors, PA-C  hydrALAZINE (APRESOLINE) 50 MG tablet Take 1 tablet (50 mg total) by mouth every 8 (eight) hours. 06/08/22   Elgergawy, Silver Huguenin, MD  hydrOXYzine (ATARAX) 25 MG tablet TAKE 1 TABLET(25 MG) BY MOUTH THREE TIMES DAILY AS NEEDED FOR ANXIETY OR  NAUSEA 07/12/22   Kirsteins, Luanna Salk, MD  insulin NPH-regular Human (70-30) 100 UNIT/ML injection Inject 8-12 Units into the skin See admin instructions. Taking 12  units in the Am and 8 units at night    [provider]  Insulin Pen Needle (PENTIPS) 32G X 4 MM MISC use as directed 09/21/21   Love, Ivan Anchors, PA-C  iron polysaccharides (NIFEREX) 150 MG capsule Take 1 capsule (150 mg total) by mouth daily. 07/06/22   Raiford Noble Latif, DO  Metoprolol Tartrate 75 MG TABS Take 75 mg by mouth 2 (two) times daily. 06/08/22   Elgergawy, Silver Huguenin, MD   mirtazapine (REMERON) 15 MG tablet Take 1 tablet (15 mg total) by mouth at bedtime. 09/21/21   Love, Ivan Anchors, PA-C  ondansetron (ZOFRAN-ODT) 4 MG disintegrating tablet Take 4 mg by mouth daily. 12/28/21   [provider]  oxyCODONE (OXY IR/ROXICODONE) 5 MG immediate release tablet Take 1 tablet (5 mg total) by mouth every 6 (six) hours as needed for severe pain or breakthrough pain. 06/08/22   Elgergawy, Silver Huguenin, MD  pantoprazole (PROTONIX) 40 MG tablet Take 1 tablet (40 mg total) by mouth daily. 07/06/22   Raiford Noble Latif, DO  PARoxetine (PAXIL) 20 MG tablet Take 1 tablet (20 mg total) by mouth at bedtime. 09/21/21   Love, Ivan Anchors, PA-C  polyethylene glycol (MIRALAX / GLYCOLAX) 17 g packet Take 17 g by mouth daily as needed for moderate constipation, mild constipation or severe constipation. 08/29/21   Terrilee Croak, MD  tamsulosin (FLOMAX) 0.4 MG CAPS capsule Take 1 capsule (0.4 mg total) by mouth daily after supper. 09/21/21   Love, Ivan Anchors, PA-C  tobramycin (TOBREX) 0.3 % ophthalmic solution Place 1 drop into both eyes as directed. Retina injections 11/03/21   [provider]  torsemide 40 MG TABS Take 40 mg by mouth daily. 07/06/22   Raiford Noble Latif, DO  traZODone (DESYREL) 50 MG tablet Take 50 mg by mouth at bedtime. 07/22/22   [provider]      Allergies    Cranberry, Hm lidocaine patch [lidocaine], Melatonin, Nsaids, and Trulicity [dulaglutide]    Review of Systems   Review of Systems  Respiratory:  Positive for shortness of breath.   Gastrointestinal:  Positive for vomiting.    Physical Exam Updated Vital Signs BP (!) 175/94   Pulse (!) 101   Temp 98.4 F (36.9 C) (Oral)   Resp 17   Ht 5\' 11"  (1.803 m)   Wt 91.6 kg   SpO2 94%   BMI 28.17 kg/m  Physical Exam Constitutional: Alert and oriented.  Fatigued and uncomfortable but nontoxic Eyes: Conjunctivae are normal. ENT      Head: Normocephalic and atraumatic.      Nose: No congestion.       Mouth/Throat: Mucous membranes are moist.      Neck: No stridor. Cardiovascular: S1, S2,  Tachycardic, regular rhythm, Normal and symmetric distal pulses are present in all extremities.Warm and well perfused. Chest wall: Indwelling right HD line, no surrounding erythema or warmth or discharge Respiratory: Normal respiratory effort. Breath sounds are normal. O2 sat 99 on RA Gastrointestinal: Soft and nontender. There is no CVA tenderness. Musculoskeletal: No pitting edema of b/l LE Neurologic: Normal speech and language, sensation grossly intact, no facial droop Skin: Skin is warm, dry  Psychiatric: Mood and affect are normal. Speech and behavior are normal.  ED Results / Procedures / Treatments   Labs (all labs ordered are  listed, but only abnormal results are displayed) Labs Reviewed  COMPREHENSIVE METABOLIC PANEL - Abnormal; Notable for the following components:      Result Value   Chloride 94 (*)    Glucose, Bld 248 (*)    BUN 22 (*)    Creatinine, Ser 4.46 (*)    Alkaline Phosphatase 270 (*)    GFR, Estimated 15 (*)    Anion gap 16 (*)    All other components within normal limits  CBC WITH DIFFERENTIAL/PLATELET - Abnormal; Notable for the following components:   WBC 11.0 (*)    RBC 3.33 (*)    Hemoglobin 9.9 (*)    HCT 29.1 (*)    Neutro Abs 8.6 (*)    All other components within normal limits  LIPASE, BLOOD - Abnormal; Notable for the following components:   Lipase 91 (*)    All other components within normal limits  I-STAT CHEM 8, ED - Abnormal; Notable for the following components:   Chloride 94 (*)    BUN 23 (*)    Creatinine, Ser 4.60 (*)    Glucose, Bld 252 (*)    Calcium, Ion 1.10 (*)    Hemoglobin 10.2 (*)    HCT 30.0 (*)    All other components within normal limits  RESP PANEL BY RT-PCR (FLU A&B, COVID) ARPGX2  CULTURE, BLOOD (ROUTINE X 2)  CULTURE, BLOOD (ROUTINE X 2)  URINE CULTURE  LACTIC ACID, PLASMA  PROTIME-INR  APTT  BRAIN NATRIURETIC PEPTIDE   MAGNESIUM  PHOSPHORUS  URINALYSIS, ROUTINE W REFLEX MICROSCOPIC  I-STAT VENOUS BLOOD GAS, ED    EKG EKG Interpretation  Date/Time:  Thursday August 19 2022 09:25:23 EDT Ventricular Rate:  115 PR Interval:  170 QRS Duration: 90 QT Interval:  343 QTC Calculation: 475 R Axis:   79 Text Interpretation: Sinus tachycardia Borderline T wave abnormalities Nonspecific T flattening I avL Confirmed by Georgina Snell [phone]) on 08/19/2022 9:52:12 AM  Radiology US Abdomen Limited RUQ (LIVER/GB)  Result Date: 08/19/2022 CLINICAL DATA:  Abdominal pain, RIGHT upper quadrant pain with nausea and vomiting EXAM: ULTRASOUND ABDOMEN LIMITED RIGHT UPPER QUADRANT COMPARISON:  06/05/2022 FINDINGS: Gallbladder: Normally distended without stones or wall thickening. No pericholecystic fluid or sonographic Murphy sign. Common bile duct: Diameter: 4 mm, normal Liver: Normal echogenicity without mass or nodularity. No intrahepatic biliary dilatation. Portal vein is patent on color Doppler imaging with normal direction of blood flow towards the liver. Other: No RIGHT upper quadrant free fluid. IMPRESSION: Normal exam. Electronically Signed   By: Lavonia Dana M.D.   On: 08/19/2022 13:35   CT L-SPINE NO CHARGE  Result Date: 08/19/2022 CLINICAL DATA:  46 year old male with sepsis. History of T4-T5 discitis osteomyelitis last year. Vomiting and diarrhea. EXAM: CT LUMBAR SPINE WITHOUT CONTRAST TECHNIQUE: Technique: Multiplanar CT images of the lumbar spine were reconstructed from contemporary CT of the Abdomen and Pelvis. RADIATION DOSE REDUCTION: This exam was performed according to the departmental dose-optimization program which includes automated exposure control, adjustment of the mA and/or kV according to patient size and/or use of iterative reconstruction technique. CONTRAST:  None COMPARISON:  Thoracic spine CT today.  Lumbar MRI 06/03/2022. FINDINGS: Segmentation: Normal, concordant with thoracic spine  numbering today and previous lumbar MRI. Alignment: Stable lumbar lordosis. No significant scoliosis or spondylolisthesis. Vertebrae: Ununited L1 transverse process ossification centers, normal variant. Intact lumbar vertebrae. No acute osseous abnormality identified. Intact visible sacrum and SI joints. Paraspinal and other soft tissues: Abdomen and pelvic viscera detailed separately. Lumbar  paraspinal soft tissues are within normal limits. Disc levels: Mild for age lumbar spine degeneration appears stable from the August MRI. IMPRESSION: 1. No acute osseous abnormality and mild for age lumbar spine degeneration stable from an August MRI. 2. CT Abdomen and Pelvis today reported separately. Electronically Signed   By: Genevie Ann M.D.   On: 08/19/2022 10:44   CT T-SPINE NO CHARGE  Result Date: 08/19/2022 CLINICAL DATA:  33-YEAR-OLD MALE WITH SEPSIS. HISTORY OF T4-T5 DISCITIS OSTEOMYELITIS LAST YEAR. VOMITING AND DIARRHEA. EXAM: CT THORACIC SPINE WITHOUT CONTRAST TECHNIQUE: Multiplanar CT images of the thoracic spine were reconstructed from contemporary CT of the Chest. RADIATION DOSE REDUCTION: This exam was performed according to the departmental dose-optimization program which includes automated exposure control, adjustment of the mA and/or kV according to patient size and/or use of iterative reconstruction technique. CONTRAST:  None COMPARISON:  Thoracic spine CT 12/18/2021. Thoracic spine MRI 06/03/2022 and earlier. FINDINGS: Limited cervical spine imaging: Cervicothoracic junction alignment is within normal limits. Thoracic spine segmentation: Normal, the same numbering system used previously. Alignment: Stable thoracic kyphosis since February. Grade 1 anterolisthesis of the remnant T4 body on the rim and T5 body, see additional details below. Vertebrae: Subtotally eroded and subsequently ankylosed T4 and T5 vertebrae, also status post surgical laminectomy there. Solid appearing ankylosis now has progressed  since February. Other thoracic vertebrae appear stable and intact. Flowing anterior endplate osteophytes also result in ankylosis at T5-T6, T9-T10. Visible posterior ribs appear intact, although with postinflammatory sclerotic changes of the bilateral 4th and 5th posterior ribs. No acute osseous abnormality identified. Paraspinal and other soft tissues: Chest and abdomen are detailed separately. Thoracic paraspinal soft tissues now are within normal limits. Disc levels: Stable from the August MRI. 30% spinal stenosis at the T5 vertebral level despite previous laminectomy there. Mild bilateral T4 and T5 neural foraminal stenosis. No other CT evidence of thoracic spinal stenosis. IMPRESSION: 1. No acute osseous abnormality identified in the thoracic spine. 2. 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 Prior laminectomy with residual 30% spinal stenosis there appears stable from an August MRI. 3.  CT Chest, Abdomen, and Pelvis today are reported separately. Electronically Signed   By: Genevie Ann M.D.   On: 08/19/2022 10:42   CT CHEST ABDOMEN PELVIS WO CONTRAST  Result Date: 08/19/2022 CLINICAL DATA:  46 year old male with sepsis. History of T4-T5 discitis osteomyelitis last year. Vomiting and diarrhea. EXAM: CT CHEST, ABDOMEN AND PELVIS WITHOUT CONTRAST TECHNIQUE: Multidetector CT imaging of the chest, abdomen and pelvis was performed following the standard protocol without IV contrast. RADIATION DOSE REDUCTION: This exam was performed according to the departmental dose-optimization program which includes automated exposure control, adjustment of the mA and/or kV according to patient size and/or use of iterative reconstruction technique. COMPARISON:  CT Abdomen and Pelvis 06/27/2022.  Chest CT 09/08/2021. FINDINGS: CT CHEST FINDINGS Cardiovascular: Dual lumen right IJ approach dialysis type catheter. No cardiomegaly or pericardial effusion. Vascular patency is not  evaluated in the absence of IV contrast. Calcified coronary artery atherosclerosis on series 3, image 28. Negative noncontrast thoracic aorta. Mediastinum/Nodes: Subcentimeter thyroid nodules Not clinically significant; no follow-up imaging recommended (ref: J Am Coll Radiol. 2015 Feb;12(2): 143-50).No mediastinal mass or lymphadenopathy on this noncontrast exam. Borderline to mild circumferential wall thickening of the distal thoracic esophagus. Some gas present throughout the esophagus which is nondilated. Lungs/Pleura: Major airways are patent and both lungs are clear today. Small bilateral pleural effusions have resolved since last  month. Musculoskeletal: Thoracic spine is detailed separately. No acute osseous abnormality identified. CT ABDOMEN PELVIS FINDINGS Hepatobiliary: Small dependent gallstones (series 3, image 61). No pericholecystic inflammation. Negative noncontrast liver. Pancreas: Negative. Spleen: Negative. Adrenals/Urinary Tract: Normal adrenal glands. Nonobstructed kidneys. No nephrolithiasis or pararenal inflammation. Normal ureters and unremarkable bladder. Stomach/Bowel: Decompressed, negative large bowel throughout the abdomen and pelvis. Decompressed terminal ileum. Diminutive or absent appendix. Fluid containing but nondilated small bowel loops. Similar fluid in the stomach. No free air, free fluid or mesenteric inflammation identified. Vascular/Lymphatic: Aortoiliac calcified atherosclerosis. Normal caliber abdominal aorta. No lymphadenopathy identified. Reproductive: Stable and negative. Other: No pelvic free fluid. Musculoskeletal: Lumbar spine is detailed separately. Sacrum, SI joints, pelvis, and proximal femurs appear stable and intact. IMPRESSION: 1. Resolved pleural effusions since last month and no acute or inflammatory process identified in the non-contrast chest, abdomen, or pelvis. 2. Thoracic and Lumbar Spine CT are reported separately. 3. Cholelithiasis. Calcified coronary  artery and Aortic Atherosclerosis (ICD10-I70.0). Electronically Signed   By: Genevie Ann M.D.   On: 08/19/2022 10:35   DG Chest Port 1 View  Result Date: 08/19/2022 CLINICAL DATA:  Questionable sepsis - evaluate for abnormality EXAM: PORTABLE CHEST 1 VIEW COMPARISON:  Radiograph 06/27/2022 FINDINGS: Right neck catheter tip overlies the right atrium. Unchanged cardiomediastinal silhouette. There is no focal airspace consolidation. There is no pleural effusion. No evidence of pneumothorax. There is no acute osseous abnormality. IMPRESSION: No evidence of acute cardiopulmonary disease. Electronically Signed   By: Maurine Simmering M.D.   On: 08/19/2022 09:56    Procedures Procedures    Medications Ordered in ED Medications  ceFEPIme (MAXIPIME) 1 g in sodium chloride 0.9 % 100 mL IVPB (has no administration in time range)  vancomycin variable dose per unstable renal function (pharmacist dosing) (has no administration in time range)  promethazine (PHENERGAN) 12.5 mg in sodium chloride 0.9 % 50 mL IVPB (has no administration in time range)  sodium chloride 0.9 % bolus 500 mL (0 mLs Intravenous Stopped 08/19/22 1205)  ceFEPIme (MAXIPIME) 2 g in sodium chloride 0.9 % 100 mL IVPB (0 g Intravenous Stopped 08/19/22 1110)  morphine (PF) 4 MG/ML injection 4 mg (4 mg Intravenous Given 08/19/22 1020)  ondansetron (ZOFRAN) injection 4 mg (4 mg Intravenous Given 08/19/22 1021)  vancomycin (VANCOREADY) IVPB 2000 mg/400 mL (0 mg Intravenous Stopped 08/19/22 1348)    ED Course/ Medical Decision Making/ A&P Clinical Course as of 08/19/22 1501  Thu Aug 19, 2022  1348 Reassessed patient, he is still feeling nauseous and unwell.  He is still persistently tachycardic in the low 100s.  His right upper quadrant ultrasound showed no evidence of cholecystitis.  Unclear underlying cause of symptoms possible viral gastroenteritis but also possible sepsis or bacterial infection consider secondary to indwelling hemodialysis  catheter.  Ordered more IV nausea medications and paged for admission to medicine. [VB]  2951 Discussed case with hospitalist who will be down to evaluate patient and put in orders for admission. [VB]    Clinical Course User Index [VB] Elgie Congo, MD                           Medical Decision Making  LYNK MARTI is a 46 y.o. male.with pmh CKD on HD T/Th/Sat, HTN, HLD, diabetes mellitus type 2, history of chronic kidney disease stage IV, history of thoracic osteomyelitis/discitis who presents today with persistent chills, nausea, nonbloody nonbilious vomiting and nonbloody diarrhea for the past week.  Patient presents with symptoms that seem suggestive of likely gastroenteritis or possible nonspecific colitis however with his symptoms of ESRD on HD with indwelling catheter and previous episodes of bacteremia with persistent shaking and possible rigor episodes, concern for sepsis.  Other sources, consider indwelling line infection although externally has no evidence of infection or cellulitis or possible recurrent discitis or osteomyelitis with increasing pain however no external changes or no new focal deficits.  Would also consider intra-abdominal pathology such as appendicitis or diverticulitis although less likely with no localizing pain on abdominal exam.  Obtaining sepsis work-up including blood cultures, urine, chest x-ray and CT chest abdomen pelvis with T and L-spine reformatting without contrast since patient still makes urine.  Will start patient on small volume fluid bolus due to history of dialysis and broad-spectrum antibiotics with vancomycin and cefepime.  Will treat patient's symptoms with morphine and Zofran.  Anticipate admission for further work-up and management.  Labs reviewed with leukocytosis 11 with left shift concerning for infection. Lactate 1.4 and reassuring.  Hemoglobin 9.9 consistent with anemia of chronic disease.  Glucose 248 with anion gap 16 but normal  bicarbonate 26 unlikely DKA.  Creatinine 4.46 in the setting of known kidney disease with potassium 3.7.  Magnesium and phosphorus within normal limits.  CT chest abdomen pelvis only showed evidence of cholelithiasis but no secondary findings suggestive of cholecystitis.  He has no transaminitis and normal total bilirubin, suspect less likely biliary pathology and RUQ US showed no cholecystitis.  His lipase is also slightly elevated 91 but not consistent with acute pancreatitis.  T and L-spine showed no new changes suggestive of infection.  He was admitted to medicine team for further management of symptoms suggested likely secondary to gastroenteritis although possible sepsis secondary to indwelling hemodialysis catheter.  Amount and/or Complexity of Data Reviewed Labs: ordered. Radiology: ordered.  Risk Prescription drug management. Decision regarding hospitalization.    Final Clinical Impression(s) / ED Diagnoses Final diagnoses:  Nausea vomiting and diarrhea  Sepsis, due to unspecified organism, unspecified whether acute organ dysfunction present ([place])  Elevated lipase  Calculus of gallbladder without cholecystitis without obstruction    Rx / DC Orders ED Discharge Orders     None         Elgie Congo, MD 08/19/22 1501

## 2022-08-19 NOTE — Assessment & Plan Note (Addendum)
CBGs acceptable range.  No ketones in UA. - 5U Humulin a.m., 7U Humulin p.m. - CBG 4 times daily

## 2022-08-19 NOTE — Sepsis Progress Note (Signed)
eLink monitoring code sepsis.  

## 2022-08-19 NOTE — H&P (Addendum)
Hospital Admission History and Physical Service Pager: [phone]  Patient name: Jose [name] Medical record number: [id number] Date of Birth: [birth year] Age: 46 y.o. Gender: male  Primary Care Provider: Roddie Mc, NP Consultants: nephrology Code Status: FULL  Preferred Emergency Contact: spouse Erin, [place]     Name Relation Home Work Schriever [phone]  [phone]      Chief Complaint: nausea/vomiting  Assessment and Plan: Jose [name] is a 46 y.o. male presenting with intractable nausea/vomiting, shaking, poor oral intake for 48 hours.  Leading differential is viral gastroenteritis.  Possibly GERD and PUD with epigastric pain and reflux.  Could also consider diabetic gastroparesis, adrenal insufficiency (less likely), intracranial pathology (unremarkable neuro exam).  Low suspicion of bacteremia, however patient was placed on antibiotics in ED due to history of MSSA bacteremia and osteomyelitis.  Possible but low suspicion of DKA (anion gap 16 with ESRD), glucose less than 250, not acidotic. Low suspicion of pancreatitis (lipase 91), gallbladder/liver pathology (ALP elevated-however imaging unremarkable see below).  Patient has history of osteomyelitis in MSSA sepsis, however not meeting SIRS criteria and osteomyelitis appears stable on CT.  Lastly, patient has ESRD but still makes urine-denies dysuria-pending UA.  * Intractable nausea and vomiting Short-lived improvement with Zofran p.o./IV.  Patient reports no p.o. intake for 48 hours.  Difficulty ambulating due to fatigue. Unremarkable lab work-up thus far- minimally elevated lipase, ALP x2 upper limit of normal.  CT abdomen, RUQ [place], CT spine  unremarkable (see below).  Benign physical exam, stable vitals.  Low suspicion for bacteremia/sepsis as not meeting SIRS criteria, however we will treat pending work-up. Treat NV symptomatically, observe overnight and await additional lab  work.  - Admit MedSurg, attending Dr. Gwendlyn Deutscher - IV promethazine ordered in the ED, consider added prn IV promethazine or metoclopramide  - Continue Vanco/cefepime, consider d/c pending cultures and clinical improvement - Encourage p.o. intake, as tolerated - Obtain UA with culture, if possible - Pending blood cultures - Monitor vitals - OOB with assistance, pending improvement - PT/OT  ESRD (end stage renal disease) ([place]) Patient currently getting HD MTTF, with plans to transition to home PD.  Patient missed HD today. Patient is not volume overloaded on exam today, electrolytes are stable. - Nephrology consulted for HD continuation, appreciate recs - Fluid restriction - I's/O's, daily weights  Type 2 diabetes mellitus with chronic kidney disease, with long-term current use of insulin (HCC) A1c 7.7 in 05/2022.  Well-controlled with 70/30 Humulin, 10U a.m. 14U p.m. Anion gap of 16, in setting of ESRD- patient still makes urine.  Not acidotic.  Consider DKA work-up, if patient worsens or evidence of acidemia/significantly elevated CBGs.  Start half insulin, titrate as needed. - 5U Humulin a.m., 7U Humulin p.m. - Consider starting sliding scale - CBG 4 times daily  Anemia Hemoglobin 9.9.  Chronic anemia likely secondary to ESRD.  Not currently on epo. Takes Niferex. - Continue Niferex - AM CBC   FEN/GI: Renal diet, carb modified VTE Prophylaxis: Heparin  Disposition: MedSurg  History of Present Illness:  Jose [name] is a 46 y.o. male presenting with nausea and vomiting.  Patient reports he has been feeling nauseous with episodes of vomiting almost every morning for the past 2 weeks. Nausea and vomiting worsened in the past 2 days, has not been able to hold anything down. He had been taking Zofran every 6 hours as needed which did help initially but not recently. He has  also had diarrhea starting two days ago. He reports chronic cough as he was a former smoker. He reports he has  recently started dialysis but has had some low blood pressures at dialysis, as low as 88/31. He is normally on Monday, Tuesday, Thursday, Friday HD, his nephrologist is Dr. Hollie Salk and Dr. Joelyn Oms. He still makes urine. He missed dialysis today. He was told he passed out during dialysis the other day. Denies fever, abdominal pain. No sick contacts.  In the ED, sepsis work-up was initiated.  Blood cultures obtained and vanc/cefepime started.  Imaging has remained unremarkable, no clear source.  Patient does make urine, but has been unable to obtain UA at this time.  Zofran IV was not enough to control nausea, promethazine was ordered.  Medicine was consulted for admission.  Review Of Systems: Per HPI with the following additions:  Review of Systems  Constitutional:  Positive for chills. Negative for fever.  Respiratory:  Positive for cough (chronic).   Cardiovascular:  Negative for leg swelling.  Gastrointestinal:  Positive for diarrhea, nausea and vomiting. Negative for abdominal pain.  Genitourinary:  Negative for dysuria.  Musculoskeletal:  Positive for back pain (chronic).  Neurological:  Positive for dizziness, weakness and headaches (worse in the past 48 hours).     Pertinent Past Medical History: Osteomyelitis thoracic spine 2022 Remainder reviewed in history tab.   Pertinent Past Surgical History: Hernia repair Appendectomy No history of cholecystectomy Remainder reviewed in history tab.  Pertinent Social History: Tobacco use: Yes/No/Former Alcohol use: Denies Other Substance use: denies Lives with wife, daughter. Ambulates with a cane.  Pertinent Family History: Reviewed in history tab.   Important Outpatient Medications: Humalin 10 units in the morning, 14 units at night Alprazolam 0.25 mg BID prn, normally takes 1-2 pills per day Torsemide 40 mg dailyparoxetine 20 mg qhs Mirtazapine 15 mg Hydroxyzine 25 mg TID prn Hydralazine 50 mg TID Tamsulosin 0.4 mg  Trazodone 100  mg qhs Metoprolol 75 mg BID Not taking amlodipine, discontinued  Last time he took his medications was yesterday evening but he did take his insulin this morning. Remainder reviewed in medication history.   Objective: BP (!) 175/94   Pulse (!) 101   Temp 98.4 F (36.9 C) (Oral)   Resp 17   Ht 5\' 11"  (1.803 m)   Wt 91.6 kg   SpO2 94%   BMI 28.17 kg/m  Exam: General: Not in acute distress, chronically ill-appearing, pleasant HEENT: Normocephalic atraumatic head.  EOM intact bilaterally, no scleral icterus.  Normal external nose and ear.  Throat is nonerythematous and without exudate. Cardiovascular: Tachycardic, no MRG Respiratory: CTAB, normal work of breathing on room air Gastrointestinal: Not tender, not distended, soft.  Bowel sounds present. Derm: Warm and dry Neuro: Alert and oriented x4, CN II through XII intact, 5/5 strength in upper and lower extremities Psych: Anxious mood and affect  Labs:  CBC BMET  Recent Labs  Lab 08/19/22 0935 08/19/22 1054  WBC 11.0*  --   HGB 9.9* 10.2*  HCT 29.1* 30.0*  PLT 308  --    Recent Labs  Lab 08/19/22 0935 08/19/22 1054  NA 136 137  K 3.7 3.7  CL 94* 94*  CO2 26  --   BUN 22* 23*  CREATININE 4.46* 4.60*  GLUCOSE 248* 252*  CALCIUM 9.2  --     Lipase: 91 ALP: 270 Lactic acid: 1.4  EKG: Sinus tachycardia, no ischemia  Imaging Studies Performed: US Abdomen Limited RUQ (LIVER/GB) Result  Date: 08/19/2022 IMPRESSION: Normal exam.   CT L-SPINE NO CHARGE Result Date: 08/19/2022 IMPRESSION: 1. No acute osseous abnormality and mild for age lumbar spine degeneration stable from an August MRI. 2. CT Abdomen and Pelvis today reported separately.  CT T-SPINE NO CHARGE Result Date: 08/19/2022 IMPRESSION: 1. No acute osseous abnormality identified in the thoracic spine. 2. 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 Prior laminectomy with residual 30% spinal stenosis  there appears stable from an August MRI. 3.  CT Chest, Abdomen, and Pelvis today are reported separately.   CT CHEST ABDOMEN PELVIS WO CONTRAST Result Date: 08/19/2022 IMPRESSION: 1. Resolved pleural effusions since last month and no acute or inflammatory process identified in the non-contrast chest, abdomen, or pelvis. 2. Thoracic and Lumbar Spine CT are reported separately. 3. Cholelithiasis. Calcified coronary artery and Aortic Atherosclerosis (ICD10-I70.0)  DG Chest Port 1 View Result Date: 08/19/2022 IMPRESSION: No evidence of acute cardiopulmonary disease.   Leslie Dales, DO 08/19/2022, 4:59 PM PGY-1, [place] Intern pager: [phone], text pages welcome Secure chat group [place]   I was personally present and performed or re-performed the history, physical exam and medical decision making activities of this service and have verified that the service and findings are accurately documented in the resident's note.  Zola Button, MD                  08/19/2022, 5:26 PM

## 2022-08-19 NOTE — Assessment & Plan Note (Addendum)
Has not received HD yet.  Does not appear volume overloaded on exam, electrolytes stable. - Nephrology consulted for HD continuation, appreciate recs - Fluid restriction - I's/O's, daily weights

## 2022-08-19 NOTE — Progress Notes (Signed)
Pharmacy Antibiotic Note  Jose [name] is a 46 y.o. male admitted on 08/19/2022 with sepsis.  Pharmacy has been consulted for vancomycin and cefepime dosing. Pt is afebrile. He recently started on hemodialysis.   Plan: Vancomycin 2g IV x 1 then f/u HD plans for ongoing doses Cefepime 2g then 1g IV Q24H F/u renal plans, C&S, clinical status and levels PRN  Height: 5\' 11"  (180.3 cm) Weight: 91.6 kg (201 lb 15.1 oz) IBW/kg (Calculated) : 75.3  Temp (24hrs), Avg:98.4 F (36.9 C), Min:98.4 F (36.9 C), Max:98.4 F (36.9 C)  No results for input(s): "WBC", "CREATININE", "LATICACIDVEN", "VANCOTROUGH", "VANCOPEAK", "VANCORANDOM", "GENTTROUGH", "GENTPEAK", "GENTRANDOM", "TOBRATROUGH", "TOBRAPEAK", "TOBRARND", "AMIKACINPEAK", "AMIKACINTROU", "AMIKACIN" in the last 168 hours.  CrCl cannot be calculated (Patient's most recent lab result is older than the maximum 21 days allowed.).    Allergies  Allergen Reactions   Cranberry Itching   Hm Lidocaine Patch [Lidocaine] Dermatitis    Blisters skin    Melatonin Other (See Comments)    nightmares   Nsaids Other (See Comments)    Stage 3 kidney disease   Trulicity [Dulaglutide] Other (See Comments)    Night sweats  Uncontrolled tempeture     Antimicrobials this admission: Vanc 10/26>> Cefepime 10/26>>  Dose adjustments this admission: N/A  Microbiology results: Pending  Thank you for allowing pharmacy to be a part of this patient's care.  Daisuke Bailey, Rande Lawman 08/19/2022 10:20 AM

## 2022-08-19 NOTE — Hospital Course (Addendum)
Jose [name] is a 46 year old male with ESRD MTTF HD, T2DM on insulin, history of osteo who presented with intractable nausea and vomiting, shaking, poor oral intake for 48 hours.  His hospital course is detailed below.  Intractable nausea and vomiting There was concern for rigors and he was started on IV antibiotics due to history of osteo and bacteremia-no source identified during this admission.  He never met SIRS criteria.  Patient remained afebrile and asymptomatic aside from nausea-  antibiotics were discontinued on day 2.  Failed treatment with p.o. and IV Zofran and IV Compazine. Work-up including labs, blood/urine cultures and imaging were unremarkable. Patient improved clinically with Reglan, was able to eat and drink.  Suspect nausea is related to gastroparesis, possibly gastroenteritis.  Recommend outpatient follow-up.  Stable at time of discharge.  ESRD Patient receiving MTTF HD, with plans to start home PD.  Still makes urine. He missed dialysis on day of admission.  Nephrology was consulted and hemodialysis was continued.  He will follow-up with dialysis clinic.  Stable at time of discharge.  Type 2 diabetes on insulin Slight anion gap in setting of ESRD on admission.  Ketones were negative with acceptable blood glucose levels-unlikely to be DKA.  He was managed with half dose of home insulin regimen.  Patient remained stable during admission.   Other chronic medical conditions medically managed and stable listed below Anemia HTN  Follow-up recommendations Would benefit from gastric emptying studies, needs to be without Reglan 48-72 hours before study. Ensure patient has appropriate follow-up with dialysis Follow-up on HTN versus hypotension.

## 2022-08-19 NOTE — Assessment & Plan Note (Addendum)
N/V improved with Reglan.  Patient has been able to hold down some snacks and fluids.  Afebrile with stable vitals.  UA not concerning for infection.  No new symptoms. Leading differential includes viral gastro enteritis v.  Gastroparesis.  - D/C abx - No growth blood cultures <24 hours - Pending urine culture - Encourage p.o. intake, as tolerated - Monitor vitals - OOB with assistance, pending improvement - PT recommends outpatient -Recommend OP gastric emptying study

## 2022-08-20 ENCOUNTER — Encounter (HOSPITAL_COMMUNITY): Payer: Self-pay | Admitting: Student

## 2022-08-20 ENCOUNTER — Other Ambulatory Visit (HOSPITAL_COMMUNITY): Payer: Self-pay

## 2022-08-20 DIAGNOSIS — R112 Nausea with vomiting, unspecified: Secondary | ICD-10-CM | POA: Diagnosis not present

## 2022-08-20 LAB — COMPREHENSIVE METABOLIC PANEL
ALT: 20 U/L (ref 0–44)
AST: 23 U/L (ref 15–41)
Albumin: 3.4 g/dL — ABNORMAL LOW (ref 3.5–5.0)
Alkaline Phosphatase: 242 U/L — ABNORMAL HIGH (ref 38–126)
Anion gap: 14 (ref 5–15)
BUN: 25 mg/dL — ABNORMAL HIGH (ref 6–20)
CO2: 24 mmol/L (ref 22–32)
Calcium: 8.9 mg/dL (ref 8.9–10.3)
Chloride: 100 mmol/L (ref 98–111)
Creatinine, Ser: 4.51 mg/dL — ABNORMAL HIGH (ref 0.61–1.24)
GFR, Estimated: 15 mL/min — ABNORMAL LOW (ref >60)
Glucose, Bld: 150 mg/dL — ABNORMAL HIGH (ref 70–99)
Potassium: 3.8 mmol/L (ref 3.5–5.1)
Sodium: 138 mmol/L (ref 135–145)
Total Bilirubin: 0.9 mg/dL (ref 0.3–1.2)
Total Protein: 6.6 g/dL (ref 6.5–8.1)

## 2022-08-20 LAB — URINE CULTURE: Culture: NO GROWTH

## 2022-08-20 LAB — CBC
HCT: 26.5 % — ABNORMAL LOW (ref 39.0–52.0)
Hemoglobin: 9.2 g/dL — ABNORMAL LOW (ref 13.0–17.0)
MCH: 30.2 pg (ref 26.0–34.0)
MCHC: 34.7 g/dL (ref 30.0–36.0)
MCV: 86.9 fL (ref 80.0–100.0)
Platelets: 230 10*3/uL (ref 150–400)
RBC: 3.05 MIL/uL — ABNORMAL LOW (ref 4.22–5.81)
RDW: 13.8 % (ref 11.5–15.5)
WBC: 6.3 10*3/uL (ref 4.0–10.5)
nRBC: 0 % (ref 0.0–0.2)

## 2022-08-20 LAB — HEPATITIS B SURFACE ANTIGEN: Hepatitis B Surface Ag: NONREACTIVE

## 2022-08-20 LAB — GLUCOSE, CAPILLARY
Glucose-Capillary: 148 mg/dL — ABNORMAL HIGH (ref 70–99)
Glucose-Capillary: 140 mg/dL — ABNORMAL HIGH (ref 70–99)
Glucose-Capillary: 101 mg/dL — ABNORMAL HIGH (ref 70–99)
Glucose-Capillary: 96 mg/dL (ref 70–99)

## 2022-08-20 LAB — HEPATITIS B CORE ANTIBODY, TOTAL: Hep B Core Total Ab: NONREACTIVE

## 2022-08-20 MED ORDER — ALTEPLASE 2 MG IJ SOLR: 2.0000 mg | Freq: Once | INTRAMUSCULAR | Status: DC | PRN

## 2022-08-20 MED ORDER — ORAL CARE MOUTH RINSE: 15.0000 mL | OROMUCOSAL | Status: DC | PRN

## 2022-08-20 MED ORDER — HEPARIN SODIUM (PORCINE) 1000 UNIT/ML IJ SOLN
INTRAMUSCULAR | Status: AC
  Administered 2022-08-20: 3.2 [IU]
  Filled 2022-08-20: qty 4

## 2022-08-20 MED ORDER — CHLORHEXIDINE GLUCONATE 4 % EX LIQD: 60.0000 mL | Freq: Once | CUTANEOUS | Status: DC

## 2022-08-20 MED ORDER — METOCLOPRAMIDE HCL 5 MG/ML IJ SOLN: 5.0000 mg | Freq: Three times a day (TID) | INTRAMUSCULAR | Status: DC

## 2022-08-20 MED ORDER — CEFAZOLIN SODIUM-DEXTROSE 2-4 GM/100ML-% IV SOLN: 2.0000 g | INTRAVENOUS | Status: DC

## 2022-08-20 MED ORDER — SODIUM CHLORIDE 0.9 % IV SOLN: INTRAVENOUS | Status: DC

## 2022-08-20 MED ORDER — CLONIDINE HCL 0.1 MG PO TABS
0.1000 mg | ORAL_TABLET | Freq: Once | ORAL | Status: AC
  Administered 2022-08-20: 0.1 mg via ORAL
  Filled 2022-08-20: qty 1

## 2022-08-20 MED ORDER — METOCLOPRAMIDE HCL 5 MG/ML IJ SOLN
5.0000 mg | Freq: Three times a day (TID) | INTRAMUSCULAR | Status: DC | PRN
  Administered 2022-08-20: 5 mg via INTRAVENOUS
  Filled 2022-08-20: qty 2

## 2022-08-20 MED ORDER — CHLORHEXIDINE GLUCONATE 4 % EX LIQD
60.0000 mL | Freq: Once | CUTANEOUS | Status: DC
  Filled 2022-08-20: qty 60

## 2022-08-20 MED ORDER — ANTICOAGULANT SODIUM CITRATE 4% (200MG/5ML) IV SOLN
5.0000 mL | Status: DC | PRN
  Filled 2022-08-20: qty 5

## 2022-08-20 MED ORDER — PENTAFLUOROPROP-TETRAFLUOROETH EX AERO: 1.0000 | INHALATION_SPRAY | CUTANEOUS | Status: DC | PRN

## 2022-08-20 MED ORDER — CHLORHEXIDINE GLUCONATE CLOTH 2 % EX PADS
6.0000 | MEDICATED_PAD | Freq: Every day | CUTANEOUS | Status: DC
  Administered 2022-08-20: 6 via TOPICAL

## 2022-08-20 MED ORDER — METOCLOPRAMIDE HCL 5 MG PO TABS
5.0000 mg | ORAL_TABLET | Freq: Three times a day (TID) | ORAL | 1 refills | Status: DC | PRN
  Filled 2022-08-20: qty 90, 30d supply, fill #0

## 2022-08-20 MED ORDER — HEPARIN SODIUM (PORCINE) 1000 UNIT/ML DIALYSIS: 1000.0000 [IU] | INTRAMUSCULAR | Status: DC | PRN

## 2022-08-20 NOTE — Progress Notes (Signed)
New Admission Note:   Arrival Method: Via stretcher from [facility] Mental Orientation:  A & O x4 Telemetry: Box 5M10 - ST 111 Assessment: Completed Skin:  Dry but intact IV:  Left AC NSL - Right Chest HD Catheter Pain: Back Pain 6/10 Tubes:  None Safety Measures: Discussed and agrees to call for assistance when he gets out of bed. Admission: Completed 5 MW Orientation: Patient has been orientated to the room, unit and staff.  Family:  From home with wife - none at bedside  Educated on [employer].  He wants all personal belonging to stay at bedside.  He has his clothing, eye magnifier, cell phone, cane, and wallet.  Orders have been reviewed and implemented. Will continue to monitor the patient. Call light has been placed within reach and bed alarm has been activated.   Earleen Reaper RN Phone number: [phone]

## 2022-08-20 NOTE — Plan of Care (Signed)
  Problem: Health Behavior/Discharge Planning: Goal: Ability to manage health-related needs will improve Outcome: Progressing   Problem: Clinical Measurements: Goal: Ability to maintain clinical measurements within normal limits will improve Outcome: Progressing Goal: Will remain free from infection Outcome: Progressing Goal: Diagnostic test results will improve Outcome: Progressing Goal: Respiratory complications will improve Outcome: Progressing Goal: Cardiovascular complication will be avoided Outcome: Progressing   Problem: Activity: Goal: Risk for activity intolerance will decrease Outcome: Progressing   Problem: Nutrition: Goal: Adequate nutrition will be maintained Outcome: Progressing   Problem: Coping: Goal: Level of anxiety will decrease Outcome: Progressing   Problem: Elimination: Goal: Will not experience complications related to bowel motility Outcome: Progressing Goal: Will not experience complications related to urinary retention Outcome: Progressing   Problem: Pain Managment: Goal: General experience of comfort will improve Outcome: Progressing   Problem: Safety: Goal: Ability to remain free from injury will improve Outcome: Progressing   Problem: Skin Integrity: Goal: Risk for impaired skin integrity will decrease Outcome: Progressing   Problem: Education: Goal: Knowledge of disease and its progression will improve Outcome: Progressing Goal: Individualized Educational Video(s) Outcome: Progressing   Problem: Fluid Volume: Goal: Compliance with measures to maintain balanced fluid volume will improve Outcome: Progressing   Problem: Health Behavior/Discharge Planning: Goal: Ability to manage health-related needs will improve Outcome: Progressing   Problem: Nutritional: Goal: Ability to make healthy dietary choices will improve Outcome: Progressing   Problem: Clinical Measurements: Goal: Complications related to the disease process,  condition or treatment will be avoided or minimized Outcome: Progressing   

## 2022-08-20 NOTE — Progress Notes (Addendum)
     Daily Progress Note Intern Pager: [phone]  Patient name: Jose [name] Medical record number: [id number] Date of birth: 1976/05/20 Age: 46 y.o. Gender: male  Primary Care Provider: Roddie Mc, NP Consultants: Nephrology Code Status: Full code  Pt Overview and Major Events to Date:  10/26: Admitted  Assessment and Plan: [name] who presents with intractable nausea vomiting, shaking, poor oral intake for 48 hours.  Pertinent past medical history of ESRD on HD, T2 DM on insulin, anemia of chronic disease.  Differential and management as below.  * Intractable nausea and vomiting N/V improved with Reglan.  Patient has been able to hold down some snacks and fluids.  Afebrile with stable vitals.  UA not concerning for infection.  No new symptoms. Leading differential includes viral gastro enteritis v.  Gastroparesis.  - Anticipate discontinue antibiotics today - No growth blood cultures <24 hours - Encourage p.o. intake, as tolerated - Monitor vitals - OOB with assistance, pending improvement - PT recommends outpatient  ESRD (end stage renal disease) ([place]) Has not received HD yet.  Does not appear volume overloaded on exam, electrolytes stable. - Nephrology consulted for HD continuation, appreciate recs - Fluid restriction - I's/O's, daily weights  Type 2 diabetes mellitus with chronic kidney disease, with long-term current use of insulin (HCC) CBGs acceptable range.  No ketones in UA. - 5U Humulin a.m., 7U Humulin p.m. - CBG 4 times daily  Anemia Stable.  Hemoglobin 9.2 today. - Continue Niferex - Repeat CBC   FEN/GI: Renal diet, carb modified PPx: Heparin Dispo:Home tomorrow pending HD and clinical improvement.  Subjective:  Patient has no acute concerns today.  Reports some improvement of nausea with Reglan.  Objective: Temp:  [97.9 F (36.6 C)-99.5 F (37.5 C)] 98.2 F (36.8 C) (10/27 0912) Pulse Rate:  [73-110] 75 (10/27 0912) Resp:  [1-24]  18 (10/27 0912) BP: (130-193)/(82-113) 139/82 (10/27 0912) SpO2:  [94 %-99 %] 96 % (10/27 0912) Weight:  [88.4 kg] 88.4 kg (10/26 1845) Physical Exam: General: Not in acute distress, resting comfortably in bed Cardiovascular: RRR, no MRG Respiratory: CTAB, normal work of breathing on room air Abdomen: Soft, nondistended, not tender.  Bowel sounds present Extremities: No pitting edema  Laboratory: Most recent CBC Lab Results  Component Value Date   WBC 6.3 08/20/2022   HGB 9.2 (L) 08/20/2022   HCT 26.5 (L) 08/20/2022   MCV 86.9 08/20/2022   PLT 230 08/20/2022   Most recent BMP    Latest Ref Rng & Units 08/20/2022    6:11 AM  BMP  Glucose 70 - 99 mg/dL 150   BUN 6 - 20 mg/dL 25   Creatinine 0.61 - 1.24 mg/dL 4.51   Sodium 135 - 145 mmol/L 138   Potassium 3.5 - 5.1 mmol/L 3.8   Chloride 98 - 111 mmol/L 100   CO2 22 - 32 mmol/L 24   Calcium 8.9 - 10.3 mg/dL 8.9     Leslie Dales, DO 08/20/2022, 9:42 AM  PGY-1, [place] Intern pager: [phone], text pages welcome Secure chat group [place]

## 2022-08-20 NOTE — Progress Notes (Signed)
   08/19/22 2016  Provider Notification  Provider Name/Title Family Medicine Resident  Date Provider Notified 08/19/22  Time Provider Notified 2016  Method of Notification Page  Notification Reason Change in status  Provider response See new orders  Date of Provider Response 08/19/22   Patient is complaining of chronic back pain and persistent nausea.  MD made aware.  New orders received and implemented.  Earleen Reaper RN

## 2022-08-20 NOTE — Progress Notes (Signed)
OT Cancellation Note  Patient Details Name: Jose [name] MRN: [id number] DOB: 1976-05-24   Cancelled Treatment:    Reason Eval/Treat Not Completed: OT screened, no needs identified, will sign off.  Discussed with patient.    Tekelia Kareem D Idalia Allbritton 08/20/2022, 2:12 PM 08/20/2022  RP, OTR/L  Acute Rehabilitation Services  Office:  [phone]

## 2022-08-20 NOTE — TOC Transition Note (Signed)
Discharge medications (1 total) are being stored in the main pharmacy on the ground floor until patient is ready for discharge.

## 2022-08-20 NOTE — Evaluation (Signed)
Physical Therapy Evaluation & Discharge  Patient Details Name: Jose [name] MRN: [id number] DOB: 1976-05-13 Today's Date: 08/20/2022  History of Present Illness  46 y/o male presented to ED on 08/19/22 for chills, N/V, and diarrhea. PMH: T2DM, retinopathy, neuropathy, HTN, HLD, CKD, hx of spinal osteomyelitis  Clinical Impression  Patient admitted with the above. PTA, patient lives with wife and child and reports modI for mobility with use of cane. Patient with hx of balance deficits. Patient functioning at baseline for mobility with use of cane but unsteady. Able to negotiate stairs with cane to safely access home environment. No further skilled PT needs identified acutely. Recommend OPPT at discharge to address balance deficits.        Recommendations for follow up therapy are one component of a multi-disciplinary discharge planning process, led by the attending physician.  Recommendations may be updated based on patient status, additional functional criteria and insurance authorization.  Follow Up Recommendations Outpatient PT (for balance)      Assistance Recommended at Discharge PRN  Patient can return home with the following       Equipment Recommendations None recommended by PT  Recommendations for Other Services       Functional Status Assessment Patient has not had a recent decline in their functional status     Precautions / Restrictions Precautions Precautions: Fall Restrictions Weight Bearing Restrictions: No      Mobility  Bed Mobility               General bed mobility comments: sitting EOB on arrival    Transfers Overall transfer level: Modified independent Equipment used: Straight cane               General transfer comment: mild unsteadiness but seems baseline    Ambulation/Gait Ambulation/Gait assistance: Modified independent (Device/Increase time) Gait Distance (Feet): 200 Feet Assistive device: Straight cane Gait  Pattern/deviations: Step-through pattern, Decreased stride length, Wide base of support, Drifts right/left Gait velocity: decreased     General Gait Details: unsteady with mobility but no assistance required  Stairs Stairs: Yes Stairs assistance: Supervision Stair Management: Step to pattern, Forwards, With cane Number of Stairs: 3 General stair comments: supervision for safety. Use of cane for ascent/descent  Wheelchair Mobility    Modified Rankin (Stroke Patients Only)       Balance Overall balance assessment: Mild deficits observed, not formally tested                                           Pertinent Vitals/Pain Pain Assessment Pain Assessment: Faces Faces Pain Scale: Hurts little more Pain Location: back - chronic Pain Descriptors / Indicators: Grimacing Pain Intervention(s): Monitored during session    Home Living Family/patient expects to be discharged to:: Private residence Living Arrangements: Spouse/significant other Available Help at Discharge: Family;Available 24 hours/day Type of Home: House Home Access: Stairs to enter Entrance Stairs-Rails: None Entrance Stairs-Number of Steps: 3   Home Layout: One level Home Equipment: Cane - single point;Shower seat;Rolling Environmental consultant (2 wheels)      Prior Function Prior Level of Function : Independent/Modified Independent             Mobility Comments: cane for mobility. 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     Hand Dominance  Extremity/Trunk Assessment   Upper Extremity Assessment Upper Extremity Assessment: Defer to OT evaluation    Lower Extremity Assessment Lower Extremity Assessment: Generalized weakness (hx of peripheral neuropathy bilaterally)    Cervical / Trunk Assessment Cervical / Trunk Assessment: Kyphotic (mild)  Communication   Communication: No  difficulties  Cognition Arousal/Alertness: Awake/alert Behavior During Therapy: WFL for tasks assessed/performed Overall Cognitive Status: Within Functional Limits for tasks assessed                                          General Comments      Exercises     Assessment/Plan    PT Assessment Patient does not need any further PT services  PT Problem List         PT Treatment Interventions      PT Goals (Current goals can be found in the Care Plan section)  Acute Rehab PT Goals Patient Stated Goal: to go home PT Goal Formulation: All assessment and education complete, DC therapy    Frequency       Co-evaluation               AM-PAC PT "6 Clicks" Mobility  Outcome Measure Help needed turning from your back to your side while in a flat bed without using bedrails?: None Help needed moving from lying on your back to sitting on the side of a flat bed without using bedrails?: None Help needed moving to and from a bed to a chair (including a wheelchair)?: None Help needed standing up from a chair using your arms (e.g., wheelchair or bedside chair)?: None Help needed to walk in hospital room?: None Help needed climbing 3-5 steps with a railing? : A Little 6 Click Score: 23    End of Session   Activity Tolerance: Patient tolerated treatment well Patient left: in chair;with call bell/phone within reach Nurse Communication: Mobility status PT Visit Diagnosis: Muscle weakness (generalized) (M62.81);Unsteadiness on feet (R26.81)    Time: 0051-1021 PT Time Calculation (min) (ACUTE ONLY): 19 min   Charges:   PT Evaluation $PT Eval Low Complexity: 1 Low          Beatric Fulop A. Gilford Rile PT, DPT Acute Rehabilitation Services Office [phone]   Linna Hoff 08/20/2022, 9:19 AM

## 2022-08-20 NOTE — TOC Initial Note (Signed)
Transition of Care [facility]) - Initial/Assessment Note    Patient Details  Name: Jose [name] MRN: [id number] Date of Birth: [birth year]  Transition of Care [facility]) CM/SW Contact:    Carles Collet, RN Phone Number: 08/20/2022, 11:23 AM  Clinical Narrative:                  Damaris Schooner w patient over the phone. He states that he is from home, plans to return home at DC. Utilizes cane at home, no need for additional DME at DC.  He is interested in OP PT as recommended by eval. Discussed locations and [facility] chosen. Referral made electronically and added to AVS, patient understands to call after DC expedite appointment scheduling.  Expected Discharge Plan: Home/Self Care Barriers to Discharge: Continued Medical Work up   Patient Goals and CMS Choice Patient states their goals for this hospitalization and ongoing recovery are:: to return home CMS Medicare.gov Compare Post Acute Care list provided to:: Patient Choice offered to / list presented to : Patient  Expected Discharge Plan and Services Expected Discharge Plan: Home/Self Care   Discharge Planning Services: CM Consult   Living arrangements for the past 2 months: Single Family Home                                      Prior Living Arrangements/Services Living arrangements for the past 2 months: Single Family Home Lives with:: Spouse              Current home services: DME    Activities of Daily Living Home Assistive Devices/Equipment: Cane (specify quad or straight), Blood pressure cuff, CBG Meter, Eyeglasses, Hand-held shower hose, Scales, Shower chair with back, Walker (specify type) ADL Screening (condition at time of admission) Patient's cognitive ability adequate to safely complete daily activities?: Yes Is the patient deaf or have difficulty hearing?: No Does the patient have difficulty seeing, even when wearing glasses/contacts?: No Does the patient have difficulty concentrating, remembering, or making  decisions?: No Patient able to express need for assistance with ADLs?: Yes Does the patient have difficulty dressing or bathing?: Yes Independently performs ADLs?: Yes (appropriate for developmental age) Does the patient have difficulty walking or climbing stairs?: Yes Weakness of Legs: Both Weakness of Arms/Hands: None  Permission Sought/Granted                  Emotional Assessment              Admission diagnosis:  Calculus of gallbladder without cholecystitis without obstruction [K80.20] Nausea vomiting and diarrhea [R11.2, R19.7] Elevated lipase [R74.8] Intractable nausea and vomiting [R11.2] Sepsis, due to unspecified organism, unspecified whether acute organ dysfunction present [facility]) [A41.9] Patient Active Problem List   Diagnosis Date Noted   ESRD (end stage renal disease) ([place]) 08/19/2022   Diarrhea 07/05/2022   Viral gastroenteritis 26/71/2458   Metabolic acidosis 09/98/3382   History of anemia due to chronic kidney disease 07/05/2022   Obesity (BMI 30-39.9) 07/05/2022   Hypoalbuminemia 07/02/2022   Intractable nausea and vomiting 06/27/2022   Type 2 diabetes mellitus with chronic kidney disease, with long-term current use of insulin ([place]) 06/06/2022   Hypertensive urgency 06/03/2022   SIRS (systemic inflammatory response syndrome) ([place]) 06/02/2022   Vertebral osteomyelitis ([place]) 12/19/2021   Chronic kidney disease 11/10/2021   Pathologic fracture of thoracic vertebrae, sequela 11/10/2021   Medication monitoring encounter 09/30/2021   GIB (gastrointestinal bleeding) 09/21/2021  Duodenal ulcer 09/21/2021   Esophagitis 09/21/2021   Osteomyelitis of sacrum ([place]) 09/21/2021   Hyponatremia    Acute kidney injury superimposed on chronic kidney disease ([place])    Acute blood loss anemia    Labile blood glucose    Thoracic spondylosis with myelopathy 09/02/2021   Debility 08/30/2021   Osteomyelitis of thoracic region ([place]) 08/06/2021   Essential hypertension  08/06/2021   CKD (chronic kidney disease) stage 3, GFR 30-59 ml/min (HCC) 08/06/2021   Pleural effusion, bilateral 08/06/2021   Diabetic ulcer of left great toe ([place])    MSSA bacteremia 07/23/2021   Constipation 07/23/2021   Lytic bone lesions on xray 07/22/2021   Anemia 05/19/2021   Nausea and vomiting 05/19/2021   Elevated blood pressure reading 05/19/2021   Bilateral carpal tunnel syndrome 01/23/2020   Numbness 12/12/2019   Microalbuminuria due to type 2 diabetes mellitus ([place]) 07/20/2019   GAD (generalized anxiety disorder) 06/18/2019   Adult ADHD 06/18/2019   Trigger middle finger of left hand 06/11/2019   Diabetic retinopathy ([place]) 07/13/2018   Tobacco use disorder 05/17/2018   Hyperlipidemia 05/17/2018   PCP:  Roddie Mc, NP Pharmacy:   [facility] [place], [place] - [place] AT [facility] [address] [place] [address] Phone: [phone] Fax: [phone]  [facility] [address] [place] [address] Phone: [phone] Fax: [phone]     Social Determinants of Health (SDOH) Interventions    Readmission Risk Interventions    07/30/2021    2:39 PM  Readmission Risk Prevention Plan  Transportation Screening Complete  PCP or Specialist Appt within 3-5 Days Complete  HRI or [place] Complete  Social Work Consult for [place] Planning/Counseling Complete  Palliative Care Screening Complete  Medication Review Press photographer) Complete

## 2022-08-20 NOTE — Progress Notes (Signed)
Pt receives out-pt HD at [place] [facility]. Spoke to [place], Therapist, sports at [employer] to make her aware pt is hospitalized but may d/c this weekend. Melissa aware pt may resume on Monday and will resume at regular chair time from prior to admission.   Melven Sartorius Renal Navigator [phone]

## 2022-08-20 NOTE — Progress Notes (Signed)
Received patient in bed to unit.  Alert and oriented.  Informed consent signed and in chart.   Treatment initiated: 1614 Treatment completed: 1920  Patient tolerated well. Provider ordered 1 dose of Clonidine 0.1mg  po x1 for elevated b/p. Informed floor nurse during handoff.  Transported back to the room  Alert, without acute distress.  Hand-off given to patient's nurse.   Access used: Catheter Access issues: none  Total UF removed: 1L Medication(s) given: Clonidine 0.1mg  x1 dose Post HD VS: 162/84,83,21,98.1,97% Post HD weight: 84.5kg   [facility]

## 2022-08-20 NOTE — Progress Notes (Addendum)
[facility] Brief Note  Jose [name] is a 46 y/o male with ESRD on HD, T2DM, HTN, h/o thoracic osteomyelitis who is admitted under observation status with 2 day history of nausea/vomiting. Work up so far has been unremarkable. Gastroenteritis vs. Gastroparesis.   Last dialysis on 10/24. New to dialysis and currently dialyzing in TCU. Plans to transition to PD. Says he's had about "30 lbs" of fluid removed since starting dialysis.   Seen in room. Feeling much better this. Tolerating PO. No fevers, chills, dyspnea, chest pain. Says he will likely be going home tomorrow.   Siting in recliner, nad Lungs: CTA CV: RRR EXT: No LE edema Access: TDC   Dialysis orders: [facility]/TCU MTTF 3h 400/A1.5x EDW 90kg 2K/2Ca.   Plan: Will order dialysis today. Full consult if changes to inpatient status.    Jose Lemons Ejigiri PA-C 08/20/2022 10:34 AM    Seen and examined independently.  Agree with note and exam as documented above by physician extender and as noted here.  Adult male ESRD newly initiated on HD, long-standing DM who presents with n/v for a couple of days.  Feels better.  Did tolerate some PO today.  He is going to do PD.  He is scheduled for PD catheter placement next wed 11/1  General adult male in bed in no acute distress HEENT normocephalic atraumatic extraocular movements intact sclera anicteric Neck supple trachea midline Lungs clear to auscultation bilaterally normal work of breathing at rest  Heart S1S2 no rub Abdomen soft nontender nondistended Extremities no edema  Psych normal mood and affect Neuro - alert and oriented x 3 provides hx and follows commands Access RIJ tunn catheter   ESRD  - HD today; usually a TCU patient   HTN  - acceptable   Nausea/vomiting  - may be gastroparesis  - symptoms better but still easing in to PO today   Anemia CKD  - assess dosing of ESA outpatient   Claudia Desanctis, MD 08/20/2022 3:47 PM

## 2022-08-20 NOTE — TOC Transition Note (Signed)
Discharge medications (1) are being stored in the Transitions of Care (TOC) Pharmacy on the second floor until patient is ready for discharge.    

## 2022-08-20 NOTE — Progress Notes (Signed)
Mobility Specialist Progress Note:   08/20/22 1626  Mobility  Activity Off unit   Pt off unit at time of visit. Will f/u as able.   Erdem Naas Mobility Specialist-Acute Rehab Secure Chat only

## 2022-08-21 DIAGNOSIS — R112 Nausea with vomiting, unspecified: Secondary | ICD-10-CM | POA: Diagnosis not present

## 2022-08-21 LAB — GLUCOSE, CAPILLARY
Glucose-Capillary: 146 mg/dL — ABNORMAL HIGH (ref 70–99)
Glucose-Capillary: 194 mg/dL — ABNORMAL HIGH (ref 70–99)

## 2022-08-21 LAB — HEPATITIS B SURFACE ANTIBODY, QUANTITATIVE: Hep B S AB Quant (Post): <3.1 m[IU]/mL — ABNORMAL LOW (ref >9.9)

## 2022-08-21 NOTE — Discharge Summary (Signed)
[facility] Discharge Summary  Patient name: Jose [name] Medical record number: [id number] Date of birth: 11-20-1975 Age: 46 y.o. Gender: male Date of Admission: 08/19/2022  Date of Discharge: 08/21/2022 Admitting Physician: Leslie Dales, DO  Primary Care Provider: Roddie Mc, NP Consultants: Nephrology  Indication for Hospitalization: Intractable nausea/vomiting  Brief Hospital Course:  OVID WITMAN is a 46 year old male with ESRD on HD MTTF, T2DM on insulin, HLD, and history of vertebral osteomyelitis who presented with intractable nausea and vomiting, shaking, and poor oral intake for 48 hours.  His hospital course is detailed below.  Intractable nausea and vomiting Patient was started on broad spectrum IV antibiotics due to rigors on admission and prior history of osteomyelitis and bacteremia. However, he never met SIRS criteria and there was no source of infection identified. Patient remained afebrile and asymptomatic aside from nausea therefore antibiotics were discontinued on day 2.  Fairly extensive work-up including labs, blood/urine cultures and imaging (CT chest, abdomen, pelvis, T-spine, and L-spine) were all unremarkable. Patient improved clinically with Reglan, was able to tolerate PO. Suspect nausea is related to gastroparesis, less likely gastroenteritis. Discharged on Reglan 21m q8h prn.  ESRD Patient receiving MTTF HD, with plans to start home PD.  Still makes urine. He missed dialysis on day of admission.  Nephrology was consulted and hemodialysis was continued.  He will follow-up with dialysis clinic.  Type 2 diabetes on insulin Slight anion gap in setting of ESRD on admission.  Ketones were negative with acceptable blood glucose levels-unlikely to be DKA.  He was managed with half dose of home insulin regimen.  Patient remained stable during admission.   Follow-up recommendations Would benefit from gastric emptying study,  needs to be without Reglan 48-72 hours before study. Follow up on labile blood pressures- BP stable during admission but apparently has been alternating between HTN and hypotension outpatient. Follow up final blood culture results  Discharge Diagnoses/Problem List:  Principal Problem:   Intractable nausea and vomiting Active Problems:   Type 2 diabetes mellitus with chronic kidney disease, with long-term current use of insulin (HCC)   ESRD (end stage renal disease) ([place]   Anemia  Disposition: Home  Discharge Condition: Stable, improved  Discharge Exam:  Gen: alert, resting comfortably in bed, NAD CV: RRR, normal S1/S2 Resp: normal effort, lungs CTA anteriorly GI: abd soft, NTND Ext: no peripheral edema Neuro: A&Ox4, no focal deficits appreciated  Significant Procedures: none  Significant Labs and Imaging:  Recent Labs  Lab 08/19/22 0935 08/19/22 1054 08/20/22 0611  WBC 11.0*  --  6.3  HGB 9.9* 10.2* 9.2*  HCT 29.1* 30.0* 26.5*  PLT 308  --  230   Recent Labs  Lab 08/19/22 0935 08/19/22 1002 08/19/22 1054 08/20/22 0611  NA 136  --  137 138  K 3.7  --  3.7 3.8  CL 94*  --  94* 100  CO2 26  --   --  24  GLUCOSE 248*  --  252* 150*  BUN 22*  --  23* 25*  CREATININE 4.46*  --  4.60* 4.51*  CALCIUM 9.2  --   --  8.9  MG  --  1.9  --   --   PHOS  --  4.0  --   --   ALKPHOS 270*  --   --  242*  AST 27  --   --  23  ALT 26  --   --  20  ALBUMIN 4.0  --   --  3.4*   Results/Tests Pending at Time of Discharge: Blood cultures  Discharge Medications:  Allergies as of 08/21/2022       Reactions   Cranberry Itching   Hm Lidocaine Patch [lidocaine] Dermatitis   Blisters skin    Melatonin Other (See Comments)   nightmares   Nsaids Other (See Comments)   Stage 3 kidney disease   Trulicity [dulaglutide] Other (See Comments)   Night sweats  Uncontrolled tempeture         Medication List     STOP taking these medications    amLODipine 10 MG  tablet Commonly known as: NORVASC   bethanechol 10 MG tablet Commonly known as: URECHOLINE   ondansetron 4 MG disintegrating tablet Commonly known as: ZOFRAN-ODT       TAKE these medications    Acetaminophen 500 MG capsule Take 500-1,000 mg by mouth every 6 (six) hours as needed for mild pain.   albuterol 108 (90 Base) MCG/ACT inhaler Commonly known as: VENTOLIN HFA Inhale 1 puff into the lungs every 4 (four) hours as needed for wheezing or shortness of breath.   ALPRAZolam 0.25 MG tablet Commonly known as: XANAX Take 0.25 mg by mouth 2 (two) times daily as needed for anxiety.   atorvastatin 40 MG tablet Commonly known as: LIPITOR Take 40 mg by mouth daily.   cholecalciferol 25 MCG (1000 UNIT) tablet Commonly known as: VITAMIN D3 Take 1,000 Units by mouth daily.   FreeStyle Libre 3 Sensor Misc 1 Device by Does not apply route every 14 (fourteen) days. Apply 1 sensor on upper arm every 14 days for continuous glucose monitoring   gabapentin 100 MG capsule Commonly known as: NEURONTIN Take 2 capsules (200 mg total) by mouth 2 (two) times daily.   hydrALAZINE 50 MG tablet Commonly known as: APRESOLINE Take 1 tablet (50 mg total) by mouth every 8 (eight) hours. What changed: when to take this   hydrOXYzine 25 MG tablet Commonly known as: ATARAX TAKE 1 TABLET(25 MG) BY MOUTH THREE TIMES DAILY AS NEEDED FOR ANXIETY OR NAUSEA What changed: See the new instructions.   insulin NPH-regular Human (70-30) 100 UNIT/ML injection Inject 8-12 Units into the skin See admin instructions. Taking 10 units in the Am and 14 units at night   iron polysaccharides 150 MG capsule Commonly known as: NIFEREX Take 1 capsule (150 mg total) by mouth daily.   metoCLOPramide 5 MG tablet Commonly known as: Reglan Take 1 tablet (5 mg total) by mouth every 8 (eight) hours as needed for nausea.   Metoprolol Tartrate 75 MG Tabs Take 75 mg by mouth 2 (two) times daily.   mirtazapine 15 MG  tablet Commonly known as: REMERON Take 1 tablet (15 mg total) by mouth at bedtime.   oxyCODONE 5 MG immediate release tablet Commonly known as: Oxy IR/ROXICODONE Take 1 tablet (5 mg total) by mouth every 6 (six) hours as needed for severe pain or breakthrough pain.   pantoprazole 40 MG tablet Commonly known as: PROTONIX Take 1 tablet (40 mg total) by mouth daily.   PARoxetine 20 MG tablet Commonly known as: PAXIL Take 1 tablet (20 mg total) by mouth at bedtime.   Pentips 32G X 4 MM Misc Generic drug: Insulin Pen Needle use as directed   polyethylene glycol 17 g packet Commonly known as: MIRALAX / GLYCOLAX Take 17 g by mouth daily as needed for moderate constipation, mild constipation or severe constipation.   tamsulosin 0.4 MG Caps capsule Commonly known as: FLOMAX Take  1 capsule (0.4 mg total) by mouth daily after supper.   Torsemide 40 MG Tabs Take 40 mg by mouth daily.   traZODone 100 MG tablet Commonly known as: DESYREL Take 100 mg by mouth at bedtime as needed for sleep.        Discharge Instructions: Please refer to Patient Instructions section of EMR for full details.  Patient was counseled important signs and symptoms that should prompt return to medical care, changes in medications, dietary instructions, activity restrictions, and follow up appointments.   Follow-Up Appointments:  Follow-up Information     [facility]. Call.   Specialty: Rehabilitation Why: referral has been placed for you electronically, please call to schedule appointment Contact information: [place] [address] [phone]               Future Appointments  Date Time Provider [place]  09/22/2022  2:30 PM [facility] None  09/29/2022 11:45 AM Elayne Snare, MD [facility] None    Alcus Dad, MD 08/21/2022, 6:53 AM PGY-3, [place]

## 2022-08-21 NOTE — Discharge Instructions (Signed)
Dear Jose [name],   Thank you for letting us participate in your care! In this section, you will find a brief summary of why you were admitted to the hospital, what happened during your admission, your diagnosis/diagnoses, and recommended follow up.  You were admitted because you were experiencing nausea and vomiting.  Your had extensive testing which did not reveal any infection or other specific cause. You were treated with anti-nausea medication for possible gastroparesis.  You were also seen by the nephrologists and received dialysis while here. Your symptoms improved and you were discharged from the hospital for meeting this goal.    POST-HOSPITAL & CARE INSTRUCTIONS Follow up with your PCP. They can arrange further testing for gastroparesis. Please see medications section of this packet for any medication changes.   DOCTOR'S APPOINTMENTS & FOLLOW UP Future Appointments  Date Time Provider [place]  09/22/2022  2:30 PM [facility] None  09/29/2022 11:45 AM Elayne Snare, MD [facility] None     Thank you for choosing [facility]! Take care and be well!  [facility]  [place], [place] [address] [phone]

## 2022-08-21 NOTE — Progress Notes (Signed)
Jose [name] to be discharged Home per MD order. Discussed prescriptions and follow up appointments with the patient. Medication list explained in detail. Patient verbalized understanding. TOC pharmacy medications given to patient before discharge.  Skin clean, dry and intact without evidence of skin break down, no evidence of skin tears noted. IV catheter discontinued intact. Site without signs and symptoms of complications. Dressing and pressure applied. Pt denies pain at the site currently. No complaints noted.  Patient free of lines, drains, and wounds.   An After Visit Summary (AVS) was printed and given to the patient. Patient escorted via wheelchair, and discharged home via private auto.  Amaryllis Dyke, RN

## 2022-08-21 NOTE — Progress Notes (Signed)
PHARMACY - PHYSICIAN COMMUNICATION CRITICAL VALUE ALERT - BLOOD CULTURE IDENTIFICATION (BCID)  Jose [name] is an 46 y.o. male who presented to [facility] on 08/19/2022 with a chief complaint of nausea/vomiting.  Assessment:  1 of 4 bottles with GPR, likely contaminant. Patient to be discharged today with outpatient gastric emptying study.  Name of physician (or Provider) Contacted: Dr. Rock Nephew  Current antibiotics: None  Changes to prescribed antibiotics recommended:  No changes necessary to current treatment plan   Merrilee Jansky, PharmD Clinical Pharmacist 08/21/2022  10:59 AM

## 2022-08-22 ENCOUNTER — Telehealth: Payer: Self-pay | Admitting: Family Medicine

## 2022-08-22 NOTE — Telephone Encounter (Signed)
Called and confirmed that I was speaking with Mr Aversa and his wife.  He states he has continued to feel better since going home, taking the Reglan, eating and drinking. Denies vomiting, fevers, or chills.  Discussed with them that blood culture showing gram positive rods in 1/4 bottles, and that I suspect this is a contaminant. Especially as we did not continue antibiotics after his first night of admission in hospital and that he continues to do well after discharge from hospital, I suspect this is Bacillus or  coryneform  or another skin species . Discussed strict return precautions including fevers, chills, recurrence of nausea and vomiting. Answered all questions and concerns.

## 2022-08-22 NOTE — Telephone Encounter (Signed)
Attempted to call home and mobile number x2, left HIPAA compliant VM.  Will try again later.   Blood cultures showing gram positive rods in one of 4 bottles after discharge, likely contaminant, wanting to discuss with patient how he is feeling, will continue to follow cultures and give strict return precautions. I do not suspect true bloodstream infection at this time as patient improved rapidly in hospital off of antibiotics for > 24 hrs with treatment for gastroparesis.  Yehuda Savannah MD

## 2022-08-23 LAB — CULTURE, BLOOD (ROUTINE X 2)

## 2022-08-24 ENCOUNTER — Other Ambulatory Visit: Payer: Self-pay

## 2022-08-24 ENCOUNTER — Encounter (HOSPITAL_COMMUNITY): Payer: Self-pay | Admitting: Vascular Surgery

## 2022-08-24 LAB — CULTURE, BLOOD (ROUTINE X 2): Culture: NO GROWTH

## 2022-08-24 NOTE — Progress Notes (Addendum)
Jose [name] denies chest pain or shortness of breath. Patient denies having any s/s of Covid in her household, also denies any known exposure to Covid.   Jose [name]  has type II diabetes.I instructed patient to take 70 % of evening dose of 70/30 and to not take 70/30 in am.  Jose [name] states that CBG's 181-250. I instructed patient to check CBG after awaking and every 2 hours until arrival  to the hospital. I Instructed patient if CBG is less than 70 to take 4 Glucose Tablets or 1 tube of Glucose Gel or 1/2 cup of a clear juice. Recheck CBG in 15 minutes if CBG is not over 70 call, pre- op desk at [phone] for further instructions. Endocrinologist is Dr. Dwyane Dee.  PCP is Roddie Mc, NP.

## 2022-08-25 ENCOUNTER — Other Ambulatory Visit: Payer: Self-pay

## 2022-08-25 ENCOUNTER — Encounter (HOSPITAL_COMMUNITY): Payer: Self-pay | Admitting: Vascular Surgery

## 2022-08-25 ENCOUNTER — Ambulatory Visit (HOSPITAL_COMMUNITY): Payer: 59 | Admitting: Anesthesiology

## 2022-08-25 ENCOUNTER — Ambulatory Visit (HOSPITAL_BASED_OUTPATIENT_CLINIC_OR_DEPARTMENT_OTHER): Payer: 59 | Admitting: Anesthesiology

## 2022-08-25 ENCOUNTER — Ambulatory Visit (HOSPITAL_COMMUNITY)
Admission: RE | Admit: 2022-08-25 | Discharge: 2022-08-25 | Disposition: A | Discharge status: Home / Self Care | Payer: 59 | Attending: Vascular Surgery | Admitting: Vascular Surgery

## 2022-08-25 ENCOUNTER — Encounter (HOSPITAL_COMMUNITY)
Admission: RE | Disposition: A | Discharge status: Home / Self Care | Payer: Self-pay | Source: Home / Self Care | Attending: Vascular Surgery

## 2022-08-25 DIAGNOSIS — D649 Anemia, unspecified: Secondary | ICD-10-CM | POA: Diagnosis not present

## 2022-08-25 DIAGNOSIS — Z87891 Personal history of nicotine dependence: Secondary | ICD-10-CM

## 2022-08-25 DIAGNOSIS — N186 End stage renal disease: Secondary | ICD-10-CM | POA: Insufficient documentation

## 2022-08-25 DIAGNOSIS — D759 Disease of blood and blood-forming organs, unspecified: Secondary | ICD-10-CM | POA: Insufficient documentation

## 2022-08-25 DIAGNOSIS — E1122 Type 2 diabetes mellitus with diabetic chronic kidney disease: Secondary | ICD-10-CM | POA: Insufficient documentation

## 2022-08-25 DIAGNOSIS — I120 Hypertensive chronic kidney disease with stage 5 chronic kidney disease or end stage renal disease: Secondary | ICD-10-CM | POA: Insufficient documentation

## 2022-08-25 DIAGNOSIS — K219 Gastro-esophageal reflux disease without esophagitis: Secondary | ICD-10-CM | POA: Diagnosis not present

## 2022-08-25 DIAGNOSIS — Z992 Dependence on renal dialysis: Secondary | ICD-10-CM | POA: Insufficient documentation

## 2022-08-25 DIAGNOSIS — Z8711 Personal history of peptic ulcer disease: Secondary | ICD-10-CM | POA: Insufficient documentation

## 2022-08-25 DIAGNOSIS — K660 Peritoneal adhesions (postprocedural) (postinfection): Secondary | ICD-10-CM | POA: Diagnosis not present

## 2022-08-25 DIAGNOSIS — Z794 Long term (current) use of insulin: Secondary | ICD-10-CM

## 2022-08-25 DIAGNOSIS — D631 Anemia in chronic kidney disease: Secondary | ICD-10-CM

## 2022-08-25 DIAGNOSIS — F419 Anxiety disorder, unspecified: Secondary | ICD-10-CM | POA: Diagnosis not present

## 2022-08-25 HISTORY — DX: Pneumonia, unspecified organism: J18.9

## 2022-08-25 HISTORY — DX: Essential (primary) hypertension: I10

## 2022-08-25 HISTORY — PX: CAPD INSERTION: SHX5233

## 2022-08-25 HISTORY — DX: Family history of other specified conditions: Z84.89

## 2022-08-25 HISTORY — DX: Headache, unspecified: R51.9

## 2022-08-25 HISTORY — DX: Anemia, unspecified: D64.9

## 2022-08-25 LAB — POCT I-STAT, CHEM 8
BUN: 23 mg/dL — ABNORMAL HIGH (ref 6–20)
Calcium, Ion: 1.07 mmol/L — ABNORMAL LOW (ref 1.15–1.40)
Chloride: 100 mmol/L (ref 98–111)
Creatinine, Ser: 2.7 mg/dL — ABNORMAL HIGH (ref 0.61–1.24)
Glucose, Bld: 210 mg/dL — ABNORMAL HIGH (ref 70–99)
HCT: 28 % — ABNORMAL LOW (ref 39.0–52.0)
Hemoglobin: 9.5 g/dL — ABNORMAL LOW (ref 13.0–17.0)
Potassium: 4 mmol/L (ref 3.5–5.1)
Sodium: 140 mmol/L (ref 135–145)
TCO2: 29 mmol/L (ref 22–32)

## 2022-08-25 LAB — GLUCOSE, CAPILLARY
Glucose-Capillary: 186 mg/dL — ABNORMAL HIGH (ref 70–99)
Glucose-Capillary: 162 mg/dL — ABNORMAL HIGH (ref 70–99)
Glucose-Capillary: 159 mg/dL — ABNORMAL HIGH (ref 70–99)

## 2022-08-25 SURGERY — LAPAROSCOPIC INSERTION CONTINUOUS AMBULATORY PERITONEAL DIALYSIS  (CAPD) CATHETER
Anesthesia: General

## 2022-08-25 MED ORDER — FENTANYL CITRATE (PF) 250 MCG/5ML IJ SOLN
INTRAMUSCULAR | Status: DC | PRN
  Administered 2022-08-25: 50 ug via INTRAVENOUS
  Administered 2022-08-25: 100 ug via INTRAVENOUS

## 2022-08-25 MED ORDER — LIDOCAINE 2% (20 MG/ML) 5 ML SYRINGE
INTRAMUSCULAR | Status: DC | PRN
  Administered 2022-08-25: 60 mg via INTRAVENOUS

## 2022-08-25 MED ORDER — ACETAMINOPHEN 10 MG/ML IV SOLN
1000.0000 mg | Freq: Once | INTRAVENOUS | Status: DC | PRN
  Administered 2022-08-25: 1000 mg via INTRAVENOUS

## 2022-08-25 MED ORDER — CHLORHEXIDINE GLUCONATE 0.12 % MT SOLN
OROMUCOSAL | Status: AC
  Administered 2022-08-25: 15 mL via OROMUCOSAL
  Filled 2022-08-25: qty 15

## 2022-08-25 MED ORDER — OXYCODONE HCL 5 MG PO TABS
5.0000 mg | ORAL_TABLET | Freq: Once | ORAL | Status: AC
  Administered 2022-08-25: 5 mg via ORAL

## 2022-08-25 MED ORDER — SODIUM CHLORIDE 0.9 % IR SOLN
Status: DC | PRN
  Administered 2022-08-25: 1000 mL

## 2022-08-25 MED ORDER — SUGAMMADEX SODIUM 200 MG/2ML IV SOLN
INTRAVENOUS | Status: DC | PRN
  Administered 2022-08-25: 200 mg via INTRAVENOUS

## 2022-08-25 MED ORDER — PHENYLEPHRINE 80 MCG/ML (10ML) SYRINGE FOR IV PUSH (FOR BLOOD PRESSURE SUPPORT)
PREFILLED_SYRINGE | INTRAVENOUS | Status: DC | PRN
  Administered 2022-08-25: 80 ug via INTRAVENOUS

## 2022-08-25 MED ORDER — CHLORHEXIDINE GLUCONATE 0.12 % MT SOLN
15.0000 mL | OROMUCOSAL | Status: AC
  Filled 2022-08-25: qty 15

## 2022-08-25 MED ORDER — CEFAZOLIN SODIUM-DEXTROSE 2-4 GM/100ML-% IV SOLN
INTRAVENOUS | Status: AC
  Filled 2022-08-25: qty 100

## 2022-08-25 MED ORDER — FENTANYL CITRATE (PF) 250 MCG/5ML IJ SOLN
INTRAMUSCULAR | Status: AC
  Filled 2022-08-25: qty 5

## 2022-08-25 MED ORDER — MIDAZOLAM HCL 2 MG/2ML IJ SOLN
INTRAMUSCULAR | Status: AC
  Filled 2022-08-25: qty 2

## 2022-08-25 MED ORDER — ACETAMINOPHEN 10 MG/ML IV SOLN
INTRAVENOUS | Status: AC
  Filled 2022-08-25: qty 100

## 2022-08-25 MED ORDER — ONDANSETRON HCL 4 MG/2ML IJ SOLN
INTRAMUSCULAR | Status: DC | PRN
  Administered 2022-08-25: 4 mg via INTRAVENOUS

## 2022-08-25 MED ORDER — MIDAZOLAM HCL 2 MG/2ML IJ SOLN
INTRAMUSCULAR | Status: DC | PRN
  Administered 2022-08-25 (×2): 1 mg via INTRAVENOUS

## 2022-08-25 MED ORDER — ROCURONIUM BROMIDE 10 MG/ML (PF) SYRINGE
PREFILLED_SYRINGE | INTRAVENOUS | Status: DC | PRN
  Administered 2022-08-25: 60 mg via INTRAVENOUS

## 2022-08-25 MED ORDER — OXYCODONE HCL 5 MG PO TABS
ORAL_TABLET | ORAL | Status: AC
  Filled 2022-08-25: qty 1

## 2022-08-25 MED ORDER — SODIUM CHLORIDE 0.9 % IV SOLN: INTRAVENOUS | Status: DC

## 2022-08-25 MED ORDER — FENTANYL CITRATE (PF) 100 MCG/2ML IJ SOLN
INTRAMUSCULAR | Status: AC
  Filled 2022-08-25: qty 2

## 2022-08-25 MED ORDER — PROPOFOL 10 MG/ML IV BOLUS
INTRAVENOUS | Status: AC
  Filled 2022-08-25: qty 20

## 2022-08-25 MED ORDER — CEFAZOLIN SODIUM-DEXTROSE 2-3 GM-%(50ML) IV SOLR
INTRAVENOUS | Status: DC | PRN
  Administered 2022-08-25: 2 g via INTRAVENOUS

## 2022-08-25 MED ORDER — EPHEDRINE SULFATE-NACL 50-0.9 MG/10ML-% IV SOSY
PREFILLED_SYRINGE | INTRAVENOUS | Status: DC | PRN
  Administered 2022-08-25: 10 mg via INTRAVENOUS
  Administered 2022-08-25: 5 mg via INTRAVENOUS

## 2022-08-25 MED ORDER — OXYCODONE HCL 5 MG PO TABS: 5.0000 mg | ORAL_TABLET | Freq: Four times a day (QID) | ORAL | 0 refills | Status: DC | PRN

## 2022-08-25 MED ORDER — DEXAMETHASONE SODIUM PHOSPHATE 10 MG/ML IJ SOLN
INTRAMUSCULAR | Status: DC | PRN
  Administered 2022-08-25: 10 mg via INTRAVENOUS

## 2022-08-25 MED ORDER — INSULIN ASPART 100 UNIT/ML IJ SOLN
0.0000 [IU] | INTRAMUSCULAR | Status: DC | PRN
  Administered 2022-08-25: 2 [IU] via SUBCUTANEOUS
  Filled 2022-08-25: qty 1

## 2022-08-25 MED ORDER — FENTANYL CITRATE (PF) 100 MCG/2ML IJ SOLN
25.0000 ug | INTRAMUSCULAR | Status: DC | PRN
  Administered 2022-08-25 (×2): 25 ug via INTRAVENOUS

## 2022-08-25 SURGICAL SUPPLY — 48 items
ADAPTER TITANIUM MEDIONICS (MISCELLANEOUS) ×1 IMPLANT
ADH SKN CLS APL DERMABOND .7 (GAUZE/BANDAGES/DRESSINGS) ×1
ADPR DLYS CATH STRL LF DISP (MISCELLANEOUS) ×1
APL PRP STRL LF DISP 70% ISPRP (MISCELLANEOUS) ×1
APL SKNCLS STERI-STRIP NONHPOA (GAUZE/BANDAGES/DRESSINGS) ×1
BENZOIN TINCTURE PRP APPL 2/3 (GAUZE/BANDAGES/DRESSINGS) ×1 IMPLANT
BIOPATCH RED 1 DISK 7.0 (GAUZE/BANDAGES/DRESSINGS) ×1 IMPLANT
BLADE CLIPPER SURG (BLADE) IMPLANT
BLADE SURG 11 STRL SS (BLADE) ×1 IMPLANT
CATH EXTENDED DIALYSIS (CATHETERS) ×1 IMPLANT
CHLORAPREP W/TINT 26 (MISCELLANEOUS) ×1 IMPLANT
COVER SURGICAL LIGHT HANDLE (MISCELLANEOUS) ×1 IMPLANT
DERMABOND ADVANCED .7 DNX12 (GAUZE/BANDAGES/DRESSINGS) IMPLANT
ELECT REM PT RETURN 9FT ADLT (ELECTROSURGICAL) ×1
ELECTRODE REM PT RTRN 9FT ADLT (ELECTROSURGICAL) ×1 IMPLANT
GAUZE SPONGE 4X4 12PLY STRL (GAUZE/BANDAGES/DRESSINGS) ×1 IMPLANT
GLOVE BIO SURGEON STRL SZ8 (GLOVE) ×1 IMPLANT
GOWN STRL REUS W/ TWL LRG LVL3 (GOWN DISPOSABLE) ×1 IMPLANT
GOWN STRL REUS W/ TWL XL LVL3 (GOWN DISPOSABLE) ×1 IMPLANT
GOWN STRL REUS W/TWL LRG LVL3 (GOWN DISPOSABLE) ×1
GOWN STRL REUS W/TWL XL LVL3 (GOWN DISPOSABLE) ×1
GRASPER SUT TROCAR 14GX15 (MISCELLANEOUS) ×1 IMPLANT
IV NS 1000ML (IV SOLUTION) ×1
IV NS 1000ML BAXH (IV SOLUTION) ×1 IMPLANT
KIT BASIN OR (CUSTOM PROCEDURE TRAY) ×1 IMPLANT
KIT TURNOVER KIT B (KITS) ×1 IMPLANT
NDL INSUFFLATION 14GA 120MM (NEEDLE) ×1 IMPLANT
NEEDLE INSUFFLATION 14GA 120MM (NEEDLE) ×1 IMPLANT
NS IRRIG 1000ML POUR BTL (IV SOLUTION) ×1 IMPLANT
PAD ARMBOARD 7.5X6 YLW CONV (MISCELLANEOUS) ×2 IMPLANT
PENCIL SMOKE EVACUATOR (MISCELLANEOUS) ×1 IMPLANT
SCISSORS LAP 5X35 DISP (ENDOMECHANICALS) IMPLANT
SET CYSTO W/LG BORE CLAMP LF (SET/KITS/TRAYS/PACK) ×1 IMPLANT
SET EXT 12IN DIALYSIS STAY-SAF (MISCELLANEOUS) IMPLANT
SPIKE FLUID TRANSFER (MISCELLANEOUS) ×1 IMPLANT
STRIP CLOSURE SKIN 1/2X4 (GAUZE/BANDAGES/DRESSINGS) ×2 IMPLANT
STYLET FALLER (MISCELLANEOUS) IMPLANT
SUT MNCRL AB 4-0 PS2 18 (SUTURE) ×2 IMPLANT
SUT PROLENE 0 SH 30 (SUTURE) ×2 IMPLANT
SUT VIC AB 3-0 SH 27 (SUTURE)
SUT VIC AB 3-0 SH 27X BRD (SUTURE) IMPLANT
SYR 20CC LL (SYRINGE) ×1 IMPLANT
TOWEL GREEN STERILE (TOWEL DISPOSABLE) ×1 IMPLANT
TOWEL GREEN STERILE FF (TOWEL DISPOSABLE) ×1 IMPLANT
TRAY LAPAROSCOPIC MC (CUSTOM PROCEDURE TRAY) ×1 IMPLANT
TROCAR 5MMX150MM (TROCAR) ×1 IMPLANT
TROCAR XCEL BLADELESS 5X75MML (TROCAR) ×1 IMPLANT
WATER STERILE IRR 1000ML POUR (IV SOLUTION) ×1 IMPLANT

## 2022-08-25 NOTE — Anesthesia Procedure Notes (Addendum)
Procedure Name: Intubation Date/Time: 08/25/2022 11:03 AM  Performed by: Lind Guest, CRNAPre-anesthesia Checklist: Patient identified, Emergency Drugs available, Suction available, Patient being monitored and Timeout performed Patient Re-evaluated:Patient Re-evaluated prior to induction Oxygen Delivery Method: Circle system utilized Preoxygenation: Pre-oxygenation with 100% oxygen Induction Type: IV induction Ventilation: Mask ventilation without difficulty Laryngoscope Size: Mac and 4 Grade View: Grade I Tube type: Oral Tube size: 7.5 mm Number of attempts: 1 Placement Confirmation: ETT inserted through vocal cords under direct vision, positive ETCO2 and breath sounds checked- equal and bilateral Tube secured with: Tape Dental Injury: Teeth and Oropharynx as per pre-operative assessment

## 2022-08-25 NOTE — Transfer of Care (Signed)
Immediate Anesthesia Transfer of Care Note  Patient: Jose [name]  Procedure(s) Performed: LAPAROSCOPIC INSERTION CONTINUOUS AMBULATORY PERITONEAL DIALYSIS  (CAPD) CATHETER WITH OMENTOPEXY  Patient Location: PACU  Anesthesia Type:General  Level of Consciousness: drowsy and responds to stimulation  Airway & Oxygen Therapy: Patient Spontanous Breathing and Patient connected to nasal cannula oxygen  Post-op Assessment: Report given to RN and Post -op Vital signs reviewed and stable  Post vital signs: Reviewed and stable  Last Vitals:  Vitals Value Taken Time  BP 128/94 08/25/22 1206  Temp    Pulse 70 08/25/22 1212  Resp 18 08/25/22 1212  SpO2 97 % 08/25/22 1212  Vitals shown include unvalidated device data.  Last Pain:  Vitals:   08/25/22 0901  TempSrc:   PainSc: 0-No pain      Patients Stated Pain Goal: 0 (74/12/87 8676)  Complications: No notable events documented.

## 2022-08-25 NOTE — Anesthesia Preprocedure Evaluation (Signed)
Anesthesia Evaluation  Patient identified by MRN, date of birth, ID band Patient awake    Reviewed: Allergy & Precautions, NPO status , Patient's Chart, lab work & pertinent test results  Airway Mallampati: II  TM Distance: >3 FB Neck ROM: Full    Dental no notable dental hx.    Pulmonary neg pulmonary ROS, former smoker,    Pulmonary exam normal        Cardiovascular hypertension, Pt. on medications and Pt. on home beta blockers  Rhythm:Regular Rate:Normal     Neuro/Psych  Headaches, Anxiety    GI/Hepatic PUD, GERD  Medicated,  Endo/Other  diabetes, Type 2, Insulin Dependent  Renal/GU ESRF and DialysisRenal disease  negative genitourinary   Musculoskeletal  (+) Arthritis ,   Abdominal Normal abdominal exam  (+)   Peds  Hematology  (+) Blood dyscrasia, anemia ,   Anesthesia Other Findings   Reproductive/Obstetrics                             Anesthesia Physical Anesthesia Plan  ASA: 3  Anesthesia Plan: General   Post-op Pain Management:    Induction: Intravenous  PONV Risk Score and Plan: 2 and Ondansetron, Dexamethasone, Midazolam and Treatment may vary due to age or medical condition  Airway Management Planned: Mask and Oral ETT  Additional Equipment: None  Intra-op Plan:   Post-operative Plan: Extubation in OR  Informed Consent: I have reviewed the patients History and Physical, chart, labs and discussed the procedure including the risks, benefits and alternatives for the proposed anesthesia with the patient or authorized representative who has indicated his/her understanding and acceptance.     Dental advisory given  Plan Discussed with:   Anesthesia Plan Comments: (Lab Results      Component                Value               Date                      WBC                      6.3                 08/20/2022                HGB                      9.5 (L)              08/25/2022                HCT                      28.0 (L)            08/25/2022                MCV                      86.9                08/20/2022                PLT                      230  08/20/2022           Lab Results      Component                Value               Date                      NA                       140                 08/25/2022                K                        4.0                 08/25/2022                CO2                      24                  08/20/2022                GLUCOSE                  210 (H)             08/25/2022                BUN                      23 (H)              08/25/2022                CREATININE               2.70 (H)            08/25/2022                CALCIUM                  8.9                 08/20/2022                GFRNONAA                 15 (L)              08/20/2022          )        Anesthesia Quick Evaluation

## 2022-08-25 NOTE — Anesthesia Postprocedure Evaluation (Signed)
Anesthesia Post Note  Patient: Jose [name]  Procedure(s) Performed: LAPAROSCOPIC INSERTION CONTINUOUS AMBULATORY PERITONEAL DIALYSIS  (CAPD) CATHETER WITH OMENTOPEXY     Patient location during evaluation: PACU Anesthesia Type: General Level of consciousness: awake and alert Pain management: pain level controlled Vital Signs Assessment: post-procedure vital signs reviewed and stable Respiratory status: spontaneous breathing, nonlabored ventilation, respiratory function stable and patient connected to nasal cannula oxygen Cardiovascular status: blood pressure returned to baseline and stable Postop Assessment: no apparent nausea or vomiting Anesthetic complications: no   No notable events documented.  Last Vitals:  Vitals:   08/25/22 1315 08/25/22 1330  BP: (!) 155/78 (!) 169/81  Pulse: 69 70  Resp: 14 15  Temp:  36.4 C  SpO2: 93% 94%    Last Pain:  Vitals:   08/25/22 1330  TempSrc:   PainSc: 4                  Eyva Califano P Ethin Drummond

## 2022-08-25 NOTE — Op Note (Signed)
DATE OF SERVICE: 08/25/2022  PATIENT:  Jose [name]  46 y.o. male  PRE-OPERATIVE DIAGNOSIS:  ESRD  POST-OPERATIVE DIAGNOSIS:  Same  PROCEDURE:   1) laparoscopic lysis of adhesions 2) laparoscopic omentopexy 3) laparoscopic placement of peritoneal dialysis catheter  SURGEON:  Surgeon(s) and Role:    * [name], MD - Primary  ASSISTANT: Vicente Serene, PA-C  An experienced assistant was required given the complexity of this procedure and the standard of surgical care. My assistant helped with exposure through counter tension, suctioning, ligation and retraction to better visualize the surgical field.  My assistant expedited sewing during the case by following my sutures. Wherever I use the term "we" in the report, my assistant actively helped me with that portion of the procedure.  ANESTHESIA:   general  EBL: <40mL  BLOOD ADMINISTERED:none  DRAINS: none   LOCAL MEDICATIONS USED:  NONE  SPECIMEN:  none  COUNTS: confirmed correct.  TOURNIQUET:  none  PATIENT DISPOSITION:  PACU - hemodynamically stable.   Delay start of Pharmacological VTE agent (>24hrs) due to surgical blood loss or risk of bleeding: no  INDICATION FOR PROCEDURE: Jose [name] is a 46 y.o. male with ESRD. He desires peritoneal dialysis. After careful discussion of risks, benefits, and alternatives the patient was offered PD catheter placement. The patient understood and wished to proceed.  OPERATIVE FINDINGS: mild adhesions to RLQ taken down bluntly. Unremarkable omentopexy. Unremarkable placement of peritoneal dialysis catheter.  DESCRIPTION OF PROCEDURE: After identification of the patient in the pre-operative holding area, the patient was transferred to the operating room. The patient was positioned supine on the operating room table. Anesthesia was induced. The abdomen was prepped and draped in standard fashion. A surgical pause was performed confirming correct patient, procedure, and operative  location.  A Veress needle was introduced into the abdomen at Palmer's point, immediately below the left costal margin.  A saline drop test was used to confirm intra-abdominal position.  The Veress needle was connected to insufflation tubing and insufflation initiated.  A low opening pressure and good flow rate were noted.  A 5 mm trocar was introduced into the right upper quadrant using Visi-View technique.  The obturator was removed and the abdomen inspected with a 30 degree angled laparoscope.  No evidence of Veress needle injury was noted.  An additional 5 mm trocar was inserted a handsbreadth away to facilitate the case.  Omental adhesions to the right lower quadrant were visualized.  These were taken down bluntly through about 5 to 10 minutes of effort.  This mobilized the omentum to an adequate extent to allow omentopexy.  The omentum was grasped with an atraumatic grasper and elevated to the upper abdomen.  A laparoscopic suture passer was used to deliver a 0 Prolene suture through the omentum.  The suture was secured down to the anterior abdominal wall with a knot.  The omentum was pexied in place.  The abdomen was desufflated.  The peritoneal catheter was measured across the abdominal wall surface.  A mark was made by the cuff in the periumbilical abdomen.  The abdomen was reinsufflated.  An advantage 5 mm trocar was then used to create a skiving path through the anterior rectus fascia, rectus muscle, and peritoneum.  The laparoscopic trocar entered in the suprapubic abdomen directing towards the pelvis.  The working end of the catheter was delivered through the trocar.  The cuff was brought into the abdomen and then placed back into the rectus muscle.  The  abdomen was desufflated again.  A "swan-neck" extension tubing for the dialysis catheter was brought onto the field.  The catheter was laid across the desufflated abdomen to lay across the upper quadrant and exit the left abdomen.  The  catheter was cut.  The 2 pieces of catheter were then connected using a Christmas tree adapter.  This was secured in place with 2 interrupted sutures of 0 Prolene.  The connected catheter was then tunneled to a counterincision in the upper quadrant and then out the lateral abdomen in a downward deflection.  Great care was taken to avoid twisting or kinking the catheter.  The abdomen was reinsufflated.  The peritoneum was inspected to ensure the catheter did not enter the cavity.  Satisfied we desufflated the abdomen.  The catheter was connected to cystoscopy tubing.  The catheter flushed and drained without any difficulty.  The trochars were removed.  All incisions were closed with interrupted 4-0 Monocryl sutures.  Dermabond was applied.  A sterile bandage was applied to the peritoneal dialysis catheter.   Upon completion of the case instrument and sharps counts were confirmed correct. The patient was transferred to the PACU in good condition. I was present for all portions of the procedure.  [name]. Stanford Breed, MD [facility] Phone Number: [phone] 08/25/2022 11:48 AM

## 2022-08-25 NOTE — Interval H&P Note (Signed)
History and Physical Interval Note:  08/25/2022 10:30 AM  Jose [name]  has presented today for surgery, with the diagnosis of ESRD.  The various methods of treatment have been discussed with the patient and family. After consideration of risks, benefits and other options for treatment, the patient has consented to  Procedure(s): [place]  (CAPD) CATHETER (N/A) POSSIBLE LAPAROSCOPIC OMENTOPEXY/ (N/A) as a surgical intervention.  The patient's history has been reviewed, patient examined, no change in status, stable for surgery.  I have reviewed the patient's chart and labs.  Questions were answered to the patient's satisfaction.     [name]

## 2022-08-25 NOTE — Discharge Instructions (Signed)
   [facility] [place]  Discharge Instructions  Activity  You may drive the day following your surgery, if you are comfortable and no longer taking prescription pain medication. Resume full activity as the soreness in your incision resolves.  Bathing/Showering  You may shower after you go home. Keep your incision dry for 48 hours. You may not shower if you have a hemodialysis catheter.  Incision Care  Clean your incision with mild soap and water after 48 hours. Pat the area dry with a clean towel. You do not need a bandage unless otherwise instructed. Do not apply any ointments or creams to your incision. You may have skin glue on your incision. Do not peel it off. It will come off on its own in about one week.  Diet  Resume your normal diet. There are not special food restrictions following this procedure. In order to heal from your surgery, it is CRITICAL to get adequate nutrition. Your body requires vitamins, minerals, and protein. Vegetables are the best source of vitamins and minerals. Vegetables also provide the perfect balance of protein. Processed food has little nutritional value, so try to avoid this.  Medications  Resume taking all of your medications. If your incision is causing pain, you may take over-the counter pain relievers such as acetaminophen (Tylenol). If you were prescribed a stronger pain medication, please be aware these medications can cause nausea and constipation. Prevent nausea by taking the medication with a snack or meal. Avoid constipation by drinking plenty of fluids and eating foods with high amount of fiber, such as fruits, vegetables, and grains.  Do not take Tylenol if you are taking prescription pain medications.  Please call us immediately for any of the following conditions:  Increased pain, redness, drainage (pus) from your incision site Fever of 101 degrees or higher Severe or worsening pain at your incision site  Reduce  your risk of vascular disease:  Stop smoking. If you would like help, call [facility] at 1-800-QUIT-NOW 450-383-1056) or [place] at [place] your cholesterol Maintain a desired weight Control your diabetes Keep your blood pressure down  Dialysis  The peritoneal dialysis catheter can be flushed as soon as 08/26/2022   08/25/2022 CORBITT CLOKE [id number] 09/16/1976  Surgeon(s): [name], MD  Procedure(s): LAPAROSCOPIC INSERTION CONTINUOUS AMBULATORY PERITONEAL DIALYSIS  (CAPD) CATHETER WITH OMENTOPEXY  Follow up as needed

## 2022-08-26 ENCOUNTER — Encounter (HOSPITAL_COMMUNITY): Payer: Self-pay | Admitting: Vascular Surgery

## 2022-09-01 ENCOUNTER — Other Ambulatory Visit: Payer: Self-pay

## 2022-09-01 ENCOUNTER — Emergency Department (HOSPITAL_COMMUNITY): Payer: 59

## 2022-09-01 ENCOUNTER — Encounter (HOSPITAL_COMMUNITY): Payer: Self-pay

## 2022-09-01 ENCOUNTER — Emergency Department (HOSPITAL_COMMUNITY)
Admission: EM | Admit: 2022-09-01 | Discharge: 2022-09-01 | Disposition: A | Discharge status: Home / Self Care | Payer: 59 | Attending: Emergency Medicine | Admitting: Emergency Medicine

## 2022-09-01 ENCOUNTER — Telehealth: Payer: Self-pay

## 2022-09-01 DIAGNOSIS — N186 End stage renal disease: Secondary | ICD-10-CM | POA: Insufficient documentation

## 2022-09-01 DIAGNOSIS — T8249XA Other complication of vascular dialysis catheter, initial encounter: Secondary | ICD-10-CM | POA: Insufficient documentation

## 2022-09-01 DIAGNOSIS — Z794 Long term (current) use of insulin: Secondary | ICD-10-CM | POA: Insufficient documentation

## 2022-09-01 DIAGNOSIS — T85611A Breakdown (mechanical) of intraperitoneal dialysis catheter, initial encounter: Secondary | ICD-10-CM

## 2022-09-01 DIAGNOSIS — Z992 Dependence on renal dialysis: Secondary | ICD-10-CM | POA: Insufficient documentation

## 2022-09-01 MED ORDER — IOHEXOL 350 MG/ML SOLN
50.0000 mL | Freq: Once | INTRAVENOUS | Status: AC | PRN
  Administered 2022-09-01: 50 mL via INTRAVENOUS

## 2022-09-01 NOTE — ED Provider Triage Note (Signed)
Emergency Medicine Provider Triage Evaluation Note  Jose [name] , a 46 y.o. male  was evaluated in triage.  Pt complains of clogged peritoneal dialysis port.  Patient reports that he is Monday Tuesday Thursday Friday dialysis.  He had a full session yesterday.  They wanted to bring him in today to put tPA through the port however they could not draw back off of the port and he was referred here.  Review of Systems  Positive: Clogged peritoneal dialysis port Negative: Fever, chills, Donnell pain, nausea, vomiting, diarrhea or any additional concerns.  Physical Exam  BP (!) 165/97 (BP Location: Right Arm)   Pulse 70   Temp 97.8 F (36.6 C) (Oral)   Resp 16   SpO2 98%  Gen:   Awake, no distress   Resp:  Normal effort  MSK:   Moves extremities without difficulty \ Other:  Abdomen soft nontender.  Medical Decision Making  Medically screening exam initiated at 11:07 AM.  Appropriate orders placed.  SHREY BOIKE was informed that the remainder of the evaluation will be completed by another provider, this initial triage assessment does not replace that evaluation, and the importance of remaining in the ED until their evaluation is complete.   Note: Portions of this report may have been transcribed using voice recognition software. Every effort was made to ensure accuracy; however, inadvertent computerized transcription errors may still be present.    Deliah Boston, PA-C 09/01/22 1110

## 2022-09-01 NOTE — ED Provider Notes (Signed)
[facility] EMERGENCY DEPARTMENT Provider Note   CSN: [id number] Arrival date & time: 09/01/22  0912     History  Chief Complaint  Patient presents with   Vascular Access Problem    MARVON SHILLINGBURG is a 46 y.o. male.  Patient is a 46 year old male with end-stage renal disease with peritoneal dialysis Monday through Friday who recently had a new dialysis port placed on 08/25/2022 who went to his dialysis unit today to have the port evaluated.  They were concerned for some redness around his port, drew cultures but were having a difficult time flushing the port.  The vascular surgery office was called and they instructed him to come to the emergency room.  Patient otherwise has no further complaints.  The history is provided by the patient and medical records.       Home Medications Prior to Admission medications   Medication Sig Start Date End Date Taking? Authorizing Provider  Acetaminophen 500 MG capsule Take 500-1,000 mg by mouth every 6 (six) hours as needed for mild pain.    [provider]  albuterol (VENTOLIN HFA) 108 (90 Base) MCG/ACT inhaler Inhale 1 puff into the lungs every 4 (four) hours as needed for wheezing or shortness of breath. 05/27/21   [provider]  ALPRAZolam Duanne Moron) 0.25 MG tablet Take 0.25 mg by mouth 2 (two) times daily as needed for anxiety. 07/22/22   [provider]  atorvastatin (LIPITOR) 40 MG tablet Take 40 mg by mouth daily.    [provider]  cholecalciferol (VITAMIN D3) 25 MCG (1000 UNIT) tablet Take 1,000 Units by mouth daily.    [provider]  Continuous Blood Gluc Sensor (FREESTYLE LIBRE 3 SENSOR) MISC 1 Device by Does not apply route every 14 (fourteen) days. Apply 1 sensor on upper arm every 14 days for continuous glucose monitoring 04/29/22   Elayne Snare, MD  gabapentin (NEURONTIN) 100 MG capsule Take 2 capsules (200 mg total) by mouth 2 (two) times daily. 09/21/21   Love, Ivan Anchors,  PA-C  hydrALAZINE (APRESOLINE) 50 MG tablet Take 1 tablet (50 mg total) by mouth every 8 (eight) hours. Patient taking differently: Take 50 mg by mouth 3 (three) times daily. 06/08/22   Elgergawy, Silver Huguenin, MD  hydrOXYzine (ATARAX) 25 MG tablet TAKE 1 TABLET(25 MG) BY MOUTH THREE TIMES DAILY AS NEEDED FOR ANXIETY OR NAUSEA Patient taking differently: Take 25 mg by mouth 3 (three) times daily as needed for anxiety or nausea. 07/12/22   Kirsteins, Luanna Salk, MD  insulin NPH-regular Human (70-30) 100 UNIT/ML injection Inject 8-12 Units into the skin See admin instructions. Taking 10 units in the Am and 14 units at night    [provider]  Insulin Pen Needle (PENTIPS) 32G X 4 MM MISC use as directed 09/21/21   Love, Ivan Anchors, PA-C  iron polysaccharides (NIFEREX) 150 MG capsule Take 1 capsule (150 mg total) by mouth daily. 07/06/22   Raiford Noble Latif, DO  metoCLOPramide (REGLAN) 5 MG tablet Take 1 tablet (5 mg total) by mouth every 8 (eight) hours as needed for nausea. 08/20/22 09/19/22  Gerrit Heck, MD  Metoprolol Tartrate 75 MG TABS Take 75 mg by mouth 2 (two) times daily. 06/08/22   Elgergawy, Silver Huguenin, MD  mirtazapine (REMERON) 15 MG tablet Take 1 tablet (15 mg total) by mouth at bedtime. 09/21/21   Love, Ivan Anchors, PA-C  oxyCODONE (ROXICODONE) 5 MG immediate release tablet Take 1 tablet (5 mg total) by  mouth every 6 (six) hours as needed. 08/25/22 08/25/23  Schuh, McKenzi P, PA-C  pantoprazole (PROTONIX) 40 MG tablet Take 1 tablet (40 mg total) by mouth daily. 07/06/22   Raiford Noble Latif, DO  PARoxetine (PAXIL) 20 MG tablet Take 1 tablet (20 mg total) by mouth at bedtime. 09/21/21   Love, Ivan Anchors, PA-C  polyethylene glycol (MIRALAX / GLYCOLAX) 17 g packet Take 17 g by mouth daily as needed for moderate constipation, mild constipation or severe constipation. 08/29/21   Terrilee Croak, MD  tamsulosin (FLOMAX) 0.4 MG CAPS capsule Take 1 capsule (0.4 mg total) by mouth daily after supper.  09/21/21   Love, Ivan Anchors, PA-C  torsemide 40 MG TABS Take 40 mg by mouth daily. 07/06/22   Raiford Noble Latif, DO  traZODone (DESYREL) 100 MG tablet Take 100 mg by mouth at bedtime as needed for sleep.    [provider]      Allergies    Cranberry, Hm lidocaine patch [lidocaine], Melatonin, Nsaids, and Trulicity [dulaglutide]    Review of Systems   Review of Systems  Physical Exam Updated Vital Signs BP (!) 159/94 (BP Location: Left Arm)   Pulse 68   Temp 98.2 F (36.8 C)   Resp 16   Ht 5\' 11"  (1.803 m)   Wt 90.7 kg   SpO2 99%   BMI 27.89 kg/m  Physical Exam Vitals and nursing note reviewed.  Constitutional:      Appearance: He is normal weight.  HENT:     Head: Normocephalic.  Cardiovascular:     Rate and Rhythm: Normal rate.  Pulmonary:     Effort: Pulmonary effort is normal. No respiratory distress.     Breath sounds: Normal breath sounds.  Abdominal:     General: Abdomen is flat.     Palpations: Abdomen is soft.     Tenderness: There is no abdominal tenderness.     Comments: Dialysis catheter present in the left lower quadrant.  Minimal erythema around the site  Musculoskeletal:     Right lower leg: No edema.     Left lower leg: No edema.  Neurological:     Mental Status: He is alert.     ED Results / Procedures / Treatments   Labs (all labs ordered are listed, but only abnormal results are displayed) Labs Reviewed - No data to display  EKG None  Radiology CT ABDOMEN PELVIS W CONTRAST  Result Date: 09/01/2022 CLINICAL DATA:  Malfunctioning peritoneal dialysis catheter. EXAM: CT ABDOMEN AND PELVIS WITH CONTRAST TECHNIQUE: Multidetector CT imaging of the abdomen and pelvis was performed using the standard protocol following bolus administration of intravenous contrast. RADIATION DOSE REDUCTION: This exam was performed according to the departmental dose-optimization program which includes automated exposure control, adjustment of the mA and/or kV  according to patient size and/or use of iterative reconstruction technique. CONTRAST:  93mL OMNIPAQUE IOHEXOL 350 MG/ML SOLN COMPARISON:  August 19, 2022. FINDINGS: Lower chest: No acute abnormality. Hepatobiliary: Minimal cholelithiasis. No biliary dilatation is noted. Liver is unremarkable. Pancreas: Unremarkable. No pancreatic ductal dilatation or surrounding inflammatory changes. Spleen: Normal in size without focal abnormality. Adrenals/Urinary Tract: Adrenal glands are unremarkable. Kidneys are normal, without renal calculi, focal lesion, or hydronephrosis. Bladder is unremarkable. Stomach/Bowel: Status post appendectomy. The stomach is unremarkable. There is no evidence of bowel obstruction or inflammation. Vascular/Lymphatic: Aortic atherosclerosis. No enlarged abdominal or pelvic lymph nodes. Reproductive: Prostate is unremarkable. Other: Peritoneal dialysis catheter is noted which enters into the lower abdominal  wall with distal tip in the right lower quadrant. Catheter does not appear to be definitively kinked. Small amount of free fluid is noted in the pelvis. No definite hernia is noted. Musculoskeletal: No acute or significant osseous findings. IMPRESSION: Peritoneal dialysis catheter is noted with distal tip in right lower quadrant. It does not appear to be kinked. There is no surrounding fluid collection around the tip. There is noted an adjacent nondilated small bowel loop. Small amount of free fluid is noted in the pelvis. Minimal cholelithiasis. Aortic Atherosclerosis (ICD10-I70.0). Electronically Signed   By: Marijo Conception M.D.   On: 09/01/2022 14:44    Procedures Procedures    Medications Ordered in ED Medications  iohexol (OMNIPAQUE) 350 MG/ML injection 50 mL (50 mLs Intravenous Contrast Given 09/01/22 1428)    ED Course/ Medical Decision Making/ A&P                           Medical Decision Making Amount and/or Complexity of Data Reviewed Radiology: ordered and independent  interpretation performed. Decision-making details documented in ED Course.  Risk Prescription drug management.   Pt with multiple medical problems and comorbidities and presenting today with a complaint that caries a high risk for morbidity and mortality.  Here today due to concern for malfunction of his peritoneal dialysis catheter.  Fluid will go when but they cannot draw back on the catheter.  Patient has no complaints at this time and does have a port for hemodialysis to use while the catheter is being evaluated.  Patient has no complaints at this time.  Low suspicion for infection and they have cultured his peritoneal fluid at dialysis.  Dr. Stanford Breed the vascular surgeon who has been caring for the patient came down to see him.  He is requesting a CT of the abdomen pelvis with contrast but otherwise patient is cleared to go home and they will schedule a procedure next Wednesday for exploratory laparoscopy to see if they can discern why the catheter will not work.  No indication for admission today.  Patient did have labs within the last week that appeared at his baseline.  He is end-stage renal disease so has no contraindication for contrast.  This discussed with the patient and vascular surgery and he reports he needs a contrasted study.  Patient denies any nausea vomiting and has been having bowel movements.  No findings to suggest obstruction at this time.  2:59 PM I have independently visualized and interpreted pt's images today. CT without acute complications.  Dr. Posey Pronto with nephrology did clear patient to get a minimal contrast load.  Patient was discharged home and plan is for him to continue his current dialysis situation and the vascular office will follow-up with him for surgery next week.  He is comfortable with this plan and was discharged home in good condition.        Final Clinical Impression(s) / ED Diagnoses Final diagnoses:  ESRD on dialysis [facility])  Peritoneal dialysis  catheter dysfunction, initial encounter [facility])    Rx / DC Orders ED Discharge Orders     None         Blanchie Dessert, MD 09/01/22 1459

## 2022-09-01 NOTE — Discharge Instructions (Signed)
Continue dialysis as planned.  Dr. Mora Appl office will call you for follow-up and possible surgery on next Wednesday.  The CAT scan does not appear to show any signs of the line being kinked or complication.

## 2022-09-01 NOTE — Telephone Encounter (Signed)
Danny from [facility] called with concerns about "exit site infection" of pt's recently placed PD catheter. Kasandra Knudsen stated it "feels fine but is not draining". Kasandra Knudsen also said pt has been on bowel regiment and moving (soft) bowels. They have sent off cultures. MD is aware and advised pt to report to ED. [facility] is calling pt who just left them, to let him know.

## 2022-09-01 NOTE — ED Notes (Signed)
Pt verbalizes understanding of discharge instructions. Opportunity for questions and answers were provided. Pt discharged from the ED.   ?

## 2022-09-01 NOTE — ED Triage Notes (Signed)
Pt. Stated, I was sent by Dr. For a peritoneal  flush. It seems to not flush good.

## 2022-09-01 NOTE — H&P (View-Only) (Signed)
[facility] [place]  ASSESSMENT / PLAN: 46 y.o. male with malfunctioning peritoneal dialysis catheter.  We will plan to do diagnostic laparoscopy for him next week on a nondialysis day.  I see no signs of infection on clinical exam today.  CHIEF COMPLAINT: Malfunctioning peritoneal dialysis catheter  HISTORY OF PRESENT ILLNESS: Jose [name] is a 46 y.o. male referred to the ER for evaluation of possible peritoneal exit site infection and malfunctioning peritoneal dialysis catheter.  Over the past week, the catheter has been difficult to use.  Fluid will flush into the catheter but, but not draining easily.  His dialysis center was also concerned that he may have a catheter exit site infection.  Because of this he was referred to the ER.  On my evaluation, the patient reports no systemic signs of infection.  We reviewed a plan for evaluating the catheter going forward.   Past Medical History:  Diagnosis Date   AKI (acute kidney injury) ([place])    Anemia    Family history of adverse reaction to anesthesia    daughter age 85 months , coded after recieving 1 injection  for surgery - an inhalation med and morphine   GAD (generalized anxiety disorder)    Headache    Hyperlipidemia    Hypertension    Macular degeneration, bilateral    Pneumonia    Retinopathy    Type II diabetes mellitus with complication, uncontrolled    retinopathy, neuropathy, microalbuminuria    Past Surgical History:  Procedure Laterality Date   APPENDECTOMY     BIOPSY  08/10/2021   Procedure: BIOPSY;  Surgeon: Otis Brace, MD;  Location: [place];  Service: Gastroenterology;;   BUBBLE STUDY  07/29/2021   Procedure: BUBBLE STUDY;  Surgeon: Jerline Pain, MD;  Location: [place] [facility];  Service: Cardiovascular;;   CAPD INSERTION N/A 08/25/2022   Procedure: LAPAROSCOPIC INSERTION CONTINUOUS AMBULATORY PERITONEAL DIALYSIS  (CAPD) CATHETER WITH OMENTOPEXY;  Surgeon: [name], MD;   Location: [facility];  Service: Vascular;  Laterality: N/A;   ESOPHAGOGASTRODUODENOSCOPY (EGD) WITH PROPOFOL N/A 08/10/2021   Procedure: ESOPHAGOGASTRODUODENOSCOPY (EGD) WITH PROPOFOL;  Surgeon: Otis Brace, MD;  Location: [facility];  Service: Gastroenterology;  Laterality: N/A;   ESOPHAGOGASTRODUODENOSCOPY (EGD) WITH PROPOFOL N/A 08/24/2021   Procedure: ESOPHAGOGASTRODUODENOSCOPY (EGD) WITH PROPOFOL;  Surgeon: Otis Brace, MD;  Location: [place];  Service: Gastroenterology;  Laterality: N/A;   HERNIA REPAIR     IR FLUORO GUIDED NEEDLE PLC ASPIRATION/INJECTION LOC  07/28/2021   LUMBAR LAMINECTOMY/DECOMPRESSION MICRODISCECTOMY N/A 08/07/2021   Procedure: THORACIC FOUR - THORACIC FIVE LAMINECTOMY/DECOMPRESSION OF SPINAL CORD, DEBRIDEMENT OF ABSCESS, MICRODISCECTOMY, INTRAOPERATIVE ULTRASOUND;  Surgeon: Consuella Lose, MD;  Location: [place];  Service: Neurosurgery;  Laterality: N/A;   TEE WITHOUT CARDIOVERSION N/A 07/29/2021   Procedure: TRANSESOPHAGEAL ECHOCARDIOGRAM (TEE);  Surgeon: Jerline Pain, MD;  Location: [facility]COPY;  Service: Cardiovascular;  Laterality: N/A;   TRIGGER FINGER RELEASE Right 10/25/2019   Procedure: RIGHT INDEX FINGER RELEASE TRIGGER FINGER/A-1 PULLEY;  Surgeon: Daryll Brod, MD;  Location: [place];  Service: Orthopedics;  Laterality: Right;  IV REGIONAL FOREARM BLOCK    Family History  Problem Relation Age of Onset   Diabetes Mother    Hyperlipidemia Mother    Stroke Mother    Diabetes Father    Hyperlipidemia Brother    Stroke Brother    ADD / ADHD Brother    ADD / ADHD Son     Social History   Socioeconomic History  Marital status: Married    Spouse name: Not on file   Number of children: 2   Years of education: Not on file   Highest education level: Bachelor's degree (e.g., BA, AB, BS)  Occupational History   Occupation: Multimedia programmer: [employer]  Tobacco Use   Smoking status: Former     Packs/day: 1.00    Years: 20.00    Total pack years: 20.00    Types: Cigarettes    Quit date: 07/2020    Years since quitting: 2.1   Smokeless tobacco: Never  Vaping Use   Vaping Use: Never used  Substance and Sexual Activity   Alcohol use: Not Currently   Drug use: Never   Sexual activity: Yes    Partners: Female    Birth control/protection: None    Comment: with monogamous partner  Other Topics Concern   Not on file  Social History Narrative   Pt has lived 73 of life in [place]. Lives at home with partner, 2 kids, 8 cats, and 1 dog.    Social Determinants of Health   Financial Resource Strain: Low Risk  (05/05/2018)   Overall Financial Resource Strain (CARDIA)    Difficulty of Paying Living Expenses: Not hard at all  Food Insecurity: No Food Insecurity (08/20/2022)   Hunger Vital Sign    Worried About Running Out of Food in the Last Year: Never true    Ran Out of Food in the Last Year: Never true  Transportation Needs: No Transportation Needs (08/20/2022)   PRAPARE - Hydrologist (Medical): No    Lack of Transportation (Non-Medical): No  Physical Activity: Insufficiently Active (05/05/2018)   Exercise Vital Sign    Days of Exercise per Week: 4 days    Minutes of Exercise per Session: 20 min  Stress: Not on file  Social Connections: Moderately Isolated (05/05/2018)   Social Connection and Isolation Panel [NHANES]    Frequency of Communication with Friends and Family: Once a week    Frequency of Social Gatherings with Friends and Family: Once a week    Attends Religious Services: Never    Marine scientist or Organizations: No    Attends Archivist Meetings: Never    Marital Status: Living with partner  Intimate Partner Violence: Not At Risk (08/20/2022)   Humiliation, Afraid, Rape, and Kick questionnaire    Fear of Current or Ex-Partner: No    Emotionally Abused: No    Physically Abused: No    Sexually Abused: No     Allergies  Allergen Reactions   Cranberry Itching   Hm Lidocaine Patch [Lidocaine] Dermatitis    Blisters skin    Melatonin Other (See Comments)    nightmares   Nsaids Other (See Comments)    Stage 3 kidney disease   Trulicity [Dulaglutide] Other (See Comments)    Night sweats  Uncontrolled tempeture     No current facility-administered medications for this encounter.   Current Outpatient Medications  Medication Sig Dispense Refill   Acetaminophen 500 MG capsule Take 500-1,000 mg by mouth every 6 (six) hours as needed for mild pain.     albuterol (VENTOLIN HFA) 108 (90 Base) MCG/ACT inhaler Inhale 1 puff into the lungs every 4 (four) hours as needed for wheezing or shortness of breath.     ALPRAZolam (XANAX) 0.25 MG tablet Take 0.25 mg by mouth 2 (two) times daily as needed for anxiety.  atorvastatin (LIPITOR) 40 MG tablet Take 40 mg by mouth daily.     cholecalciferol (VITAMIN D3) 25 MCG (1000 UNIT) tablet Take 1,000 Units by mouth daily.     Continuous Blood Gluc Sensor (FREESTYLE LIBRE 3 SENSOR) MISC 1 Device by Does not apply route every 14 (fourteen) days. Apply 1 sensor on upper arm every 14 days for continuous glucose monitoring 2 each 2   gabapentin (NEURONTIN) 100 MG capsule Take 2 capsules (200 mg total) by mouth 2 (two) times daily. 120 capsule 0   hydrALAZINE (APRESOLINE) 50 MG tablet Take 1 tablet (50 mg total) by mouth every 8 (eight) hours. (Patient taking differently: Take 50 mg by mouth 3 (three) times daily.) 150 tablet 0   hydrOXYzine (ATARAX) 25 MG tablet TAKE 1 TABLET(25 MG) BY MOUTH THREE TIMES DAILY AS NEEDED FOR ANXIETY OR NAUSEA (Patient taking differently: Take 25 mg by mouth 3 (three) times daily as needed for anxiety or nausea.) 90 tablet 1   insulin NPH-regular Human (70-30) 100 UNIT/ML injection Inject 8-12 Units into the skin See admin instructions. Taking 10 units in the Am and 14 units at night     Insulin Pen Needle (PENTIPS) 32G X 4 MM MISC use  as directed 100 each 1   iron polysaccharides (NIFEREX) 150 MG capsule Take 1 capsule (150 mg total) by mouth daily. 30 capsule 0   metoCLOPramide (REGLAN) 5 MG tablet Take 1 tablet (5 mg total) by mouth every 8 (eight) hours as needed for nausea. 90 tablet 1   Metoprolol Tartrate 75 MG TABS Take 75 mg by mouth 2 (two) times daily. 60 tablet 0   mirtazapine (REMERON) 15 MG tablet Take 1 tablet (15 mg total) by mouth at bedtime. 30 tablet 0   oxyCODONE (ROXICODONE) 5 MG immediate release tablet Take 1 tablet (5 mg total) by mouth every 6 (six) hours as needed. 20 tablet 0   pantoprazole (PROTONIX) 40 MG tablet Take 1 tablet (40 mg total) by mouth daily. 30 tablet 0   PARoxetine (PAXIL) 20 MG tablet Take 1 tablet (20 mg total) by mouth at bedtime. 30 tablet 0   polyethylene glycol (MIRALAX / GLYCOLAX) 17 g packet Take 17 g by mouth daily as needed for moderate constipation, mild constipation or severe constipation. 14 each 0   tamsulosin (FLOMAX) 0.4 MG CAPS capsule Take 1 capsule (0.4 mg total) by mouth daily after supper. 30 capsule 0   torsemide 40 MG TABS Take 40 mg by mouth daily. 30 tablet 0   traZODone (DESYREL) 100 MG tablet Take 100 mg by mouth at bedtime as needed for sleep.      PHYSICAL EXAM Vitals:   09/01/22 1103 09/01/22 1110 09/01/22 1140  BP: (!) 165/97  (!) 159/94  Pulse: 70  68  Resp: 16  16  Temp: 97.8 F (36.6 C)  98.2 F (36.8 C)  TempSrc: Oral    SpO2: 98%  99%  Weight:  90.7 kg   Height:  5\' 11"  (1.803 m)    Chronically ill middle-aged man in no acute distress Regular rate and rhythm Unlabored breathing Soft nontender abdomen.  Catheter exit site in the left abdomen is healthy without any evidence of infection.  PERTINENT LABORATORY AND RADIOLOGIC DATA  Most recent CBC    Latest Ref Rng & Units 08/25/2022    9:20 AM 08/20/2022    6:11 AM 08/19/2022   10:54 AM  CBC  WBC 4.0 - 10.5 K/uL  6.3  Hemoglobin 13.0 - 17.0 g/dL 9.5  9.2  10.2   Hematocrit 39.0  - 52.0 % 28.0  26.5  30.0   Platelets 150 - 400 K/uL  230       Most recent CMP    Latest Ref Rng & Units 08/25/2022    9:20 AM 08/20/2022    6:11 AM 08/19/2022   10:54 AM  CMP  Glucose 70 - 99 mg/dL 210  150  252   BUN 6 - 20 mg/dL 23  25  23    Creatinine 0.61 - 1.24 mg/dL 2.70  4.51  4.60   Sodium 135 - 145 mmol/L 140  138  137   Potassium 3.5 - 5.1 mmol/L 4.0  3.8  3.7   Chloride 98 - 111 mmol/L 100  100  94   CO2 22 - 32 mmol/L  24    Calcium 8.9 - 10.3 mg/dL  8.9    Total Protein 6.5 - 8.1 g/dL  6.6    Total Bilirubin 0.3 - 1.2 mg/dL  0.9    Alkaline Phos 38 - 126 U/L  242    AST 15 - 41 U/L  23    ALT 0 - 44 U/L  20      Renal function Estimated Creatinine Clearance: 38.6 mL/min (A) (by C-G formula based on SCr of 2.7 mg/dL (H)).  Hgb A1c MFr Bld (%)  Date Value  06/17/2022 7.7 (H)    LDL Chol Calc (NIH)  Date Value Ref Range Status  11/22/2019 57 0 - 99 mg/dL Final   Direct LDL  Date Value Ref Range Status  04/23/2022 115.0 mg/dL Final    Comment:    Optimal:  <100 mg/dLNear or Above Optimal:  100-129 mg/dLBorderline High:  130-159 mg/dLHigh:  160-189 mg/dLVery High:  >190 mg/dL    CT scan of the abdomen and pelvis personally reviewed The catheter does not appear kinked in the abdominal wall. The catheter does flip into the mid abdomen and appears nestled among loops of small intestine. I suspect the catheter will need to be pexied to the pelvis.  [name]. Stanford Breed, MD [facility] Phone Number: [phone] 09/01/2022 2:39 PM   Total time spent on preparing this encounter including chart review, data review, collecting history, examining the patient, coordinating care for this established patient, 20 minutes.  Portions of this report may have been transcribed using voice recognition software.  Every effort has been made to ensure accuracy; however, inadvertent computerized transcription errors may still be  present.

## 2022-09-01 NOTE — Consult Note (Signed)
VASC[facility]IN SPECIALISTS OF [place]  ASSESSMENT / PLAN: 46 y.o. male with malfunctioning peritoneal dialysis catheter.  We will plan to do diagnostic laparoscopy for him next week on a nondialysis day.  I see no signs of infection on clinical exam today.  CHIEF COMPLAINT: Malfunctioning peritoneal dialysis catheter  HISTORY OF PRESENT ILLNESS: Jose [name] is a 46 y.o. male referred to the ER for evaluation of possible peritoneal exit site infection and malfunctioning peritoneal dialysis catheter.  Over the past week, the catheter has been difficult to use.  Fluid will flush into the catheter but, but not draining easily.  His dialysis center was also concerned that he may have a catheter exit site infection.  Because of this he was referred to the ER.  On my evaluation, the patient reports no systemic signs of infection.  We reviewed a plan for evaluating the catheter going forward.   Past Medical History:  Diagnosis Date   AKI (acute kidney injury) ([place])    Anemia    Family history of adverse reaction to anesthesia    daughter age 30 months , coded after recieving 1 injection  for surgery - an inhalation med and morphine   GAD (generalized anxiety disorder)    Headache    Hyperlipidemia    Hypertension    Macular degeneration, bilateral    Pneumonia    Retinopathy    Type II diabetes mellitus with complication, uncontrolled    retinopathy, neuropathy, microalbuminuria    Past Surgical History:  Procedure Laterality Date   APPENDECTOMY     BIOPSY  08/10/2021   Procedure: BIOPSY;  Surgeon: Otis Brace, MD;  Location: [facility];  Service: Gastroenterology;;   BUBBLE STUDY  07/29/2021   Procedure: BUBBLE STUDY;  Surgeon: Jerline Pain, MD;  Location: [facility];  Service: Cardiovascular;;   CAPD INSERTION N/A 08/25/2022   Procedure: LAPAROSCOPIC INSERTION CONTINUOUS AMBULATORY PERITONEAL DIALYSIS  (CAPD) CATHETER WITH OMENTOPEXY;  Surgeon: [name], MD;   Location: [facility];  Service: Vascular;  Laterality: N/A;   ESOPHAGOGASTRODUODENOSCOPY (EGD) WITH PROPOFOL N/A 08/10/2021   Procedure: ESOPHAGOGASTRODUODENOSCOPY (EGD) WITH PROPOFOL;  Surgeon: Otis Brace, MD;  Location: [facility];  Service: Gastroenterology;  Laterality: N/A;   ESOPHAGOGASTRODUODENOSCOPY (EGD) WITH PROPOFOL N/A 08/24/2021   Procedure: ESOPHAGOGASTRODUODENOSCOPY (EGD) WITH PROPOFOL;  Surgeon: Otis Brace, MD;  Location: [facility];  Service: Gastroenterology;  Laterality: N/A;   HERNIA REPAIR     IR FLUORO GUIDED NEEDLE PLC ASPIRATION/INJECTION LOC  07/28/2021   LUMBAR LAMINECTOMY/DECOMPRESSION MICRODISCECTOMY N/A 08/07/2021   Procedure: THORACIC FOUR - THORACIC FIVE LAMINECTOMY/DECOMPRESSION OF SPINAL CORD, DEBRIDEMENT OF ABSCESS, MICRODISCECTOMY, INTRAOPERATIVE ULTRASOUND;  Surgeon: Consuella Lose, MD;  Location: [facility];  Service: Neurosurgery;  Laterality: N/A;   TEE WITHOUT CARDIOVERSION N/A 07/29/2021   Procedure: TRANSESOPHAGEAL ECHOCARDIOGRAM (TEE);  Surgeon: Jerline Pain, MD;  Location: [facility];  Service: Cardiovascular;  Laterality: N/A;   TRIGGER FINGER RELEASE Right 10/25/2019   Procedure: RIGHT INDEX FINGER RELEASE TRIGGER FINGER/A-1 PULLEY;  Surgeon: Daryll Brod, MD;  Location: [facility];  Service: Orthopedics;  Laterality: Right;  IV REGIONAL FOREARM BLOCK    Family History  Problem Relation Age of Onset   Diabetes Mother    Hyperlipidemia Mother    Stroke Mother    Diabetes Father    Hyperlipidemia Brother    Stroke Brother    ADD / ADHD Brother    ADD / ADHD Son     Social History   Socioeconomic History  Marital status: Married    Spouse name: Not on file   Number of children: 2   Years of education: Not on file   Highest education level: Bachelor's degree (e.g., BA, AB, BS)  Occupational History   Occupation: Multimedia programmer: [employer]  Tobacco Use   Smoking status: Former     Packs/day: 1.00    Years: 20.00    Total pack years: 20.00    Types: Cigarettes    Quit date: 07/2020    Years since quitting: 2.1   Smokeless tobacco: Never  Vaping Use   Vaping Use: Never used  Substance and Sexual Activity   Alcohol use: Not Currently   Drug use: Never   Sexual activity: Yes    Partners: Female    Birth control/protection: None    Comment: with monogamous partner  Other Topics Concern   Not on file  Social History Narrative   Pt has lived 58 of life in [place]. Lives at home with partner, 2 kids, 8 cats, and 1 dog.    Social Determinants of Health   Financial Resource Strain: Low Risk  (05/05/2018)   Overall Financial Resource Strain (CARDIA)    Difficulty of Paying Living Expenses: Not hard at all  Food Insecurity: No Food Insecurity (08/20/2022)   Hunger Vital Sign    Worried About Running Out of Food in the Last Year: Never true    Ran Out of Food in the Last Year: Never true  Transportation Needs: No Transportation Needs (08/20/2022)   PRAPARE - Hydrologist (Medical): No    Lack of Transportation (Non-Medical): No  Physical Activity: Insufficiently Active (05/05/2018)   Exercise Vital Sign    Days of Exercise per Week: 4 days    Minutes of Exercise per Session: 20 min  Stress: Not on file  Social Connections: Moderately Isolated (05/05/2018)   Social Connection and Isolation Panel [NHANES]    Frequency of Communication with Friends and Family: Once a week    Frequency of Social Gatherings with Friends and Family: Once a week    Attends Religious Services: Never    Marine scientist or Organizations: No    Attends Archivist Meetings: Never    Marital Status: Living with partner  Intimate Partner Violence: Not At Risk (08/20/2022)   Humiliation, Afraid, Rape, and Kick questionnaire    Fear of Current or Ex-Partner: No    Emotionally Abused: No    Physically Abused: No    Sexually Abused: No     Allergies  Allergen Reactions   Cranberry Itching   Hm Lidocaine Patch [Lidocaine] Dermatitis    Blisters skin    Melatonin Other (See Comments)    nightmares   Nsaids Other (See Comments)    Stage 3 kidney disease   Trulicity [Dulaglutide] Other (See Comments)    Night sweats  Uncontrolled tempeture     No current facility-administered medications for this encounter.   Current Outpatient Medications  Medication Sig Dispense Refill   Acetaminophen 500 MG capsule Take 500-1,000 mg by mouth every 6 (six) hours as needed for mild pain.     albuterol (VENTOLIN HFA) 108 (90 Base) MCG/ACT inhaler Inhale 1 puff into the lungs every 4 (four) hours as needed for wheezing or shortness of breath.     ALPRAZolam (XANAX) 0.25 MG tablet Take 0.25 mg by mouth 2 (two) times daily as needed for anxiety.  atorvastatin (LIPITOR) 40 MG tablet Take 40 mg by mouth daily.     cholecalciferol (VITAMIN D3) 25 MCG (1000 UNIT) tablet Take 1,000 Units by mouth daily.     Continuous Blood Gluc Sensor (FREESTYLE LIBRE 3 SENSOR) MISC 1 Device by Does not apply route every 14 (fourteen) days. Apply 1 sensor on upper arm every 14 days for continuous glucose monitoring 2 each 2   gabapentin (NEURONTIN) 100 MG capsule Take 2 capsules (200 mg total) by mouth 2 (two) times daily. 120 capsule 0   hydrALAZINE (APRESOLINE) 50 MG tablet Take 1 tablet (50 mg total) by mouth every 8 (eight) hours. (Patient taking differently: Take 50 mg by mouth 3 (three) times daily.) 150 tablet 0   hydrOXYzine (ATARAX) 25 MG tablet TAKE 1 TABLET(25 MG) BY MOUTH THREE TIMES DAILY AS NEEDED FOR ANXIETY OR NAUSEA (Patient taking differently: Take 25 mg by mouth 3 (three) times daily as needed for anxiety or nausea.) 90 tablet 1   insulin NPH-regular Human (70-30) 100 UNIT/ML injection Inject 8-12 Units into the skin See admin instructions. Taking 10 units in the Am and 14 units at night     Insulin Pen Needle (PENTIPS) 32G X 4 MM MISC use  as directed 100 each 1   iron polysaccharides (NIFEREX) 150 MG capsule Take 1 capsule (150 mg total) by mouth daily. 30 capsule 0   metoCLOPramide (REGLAN) 5 MG tablet Take 1 tablet (5 mg total) by mouth every 8 (eight) hours as needed for nausea. 90 tablet 1   Metoprolol Tartrate 75 MG TABS Take 75 mg by mouth 2 (two) times daily. 60 tablet 0   mirtazapine (REMERON) 15 MG tablet Take 1 tablet (15 mg total) by mouth at bedtime. 30 tablet 0   oxyCODONE (ROXICODONE) 5 MG immediate release tablet Take 1 tablet (5 mg total) by mouth every 6 (six) hours as needed. 20 tablet 0   pantoprazole (PROTONIX) 40 MG tablet Take 1 tablet (40 mg total) by mouth daily. 30 tablet 0   PARoxetine (PAXIL) 20 MG tablet Take 1 tablet (20 mg total) by mouth at bedtime. 30 tablet 0   polyethylene glycol (MIRALAX / GLYCOLAX) 17 g packet Take 17 g by mouth daily as needed for moderate constipation, mild constipation or severe constipation. 14 each 0   tamsulosin (FLOMAX) 0.4 MG CAPS capsule Take 1 capsule (0.4 mg total) by mouth daily after supper. 30 capsule 0   torsemide 40 MG TABS Take 40 mg by mouth daily. 30 tablet 0   traZODone (DESYREL) 100 MG tablet Take 100 mg by mouth at bedtime as needed for sleep.      PHYSICAL EXAM Vitals:   09/01/22 1103 09/01/22 1110 09/01/22 1140  BP: (!) 165/97  (!) 159/94  Pulse: 70  68  Resp: 16  16  Temp: 97.8 F (36.6 C)  98.2 F (36.8 C)  TempSrc: Oral    SpO2: 98%  99%  Weight:  90.7 kg   Height:  5\' 11"  (1.803 m)    Chronically ill middle-aged man in no acute distress Regular rate and rhythm Unlabored breathing Soft nontender abdomen.  Catheter exit site in the left abdomen is healthy without any evidence of infection.  PERTINENT LABORATORY AND RADIOLOGIC DATA  Most recent CBC    Latest Ref Rng & Units 08/25/2022    9:20 AM 08/20/2022    6:11 AM 08/19/2022   10:54 AM  CBC  WBC 4.0 - 10.5 K/uL  6.3  Hemoglobin 13.0 - 17.0 g/dL 9.5  9.2  10.2   Hematocrit 39.0  - 52.0 % 28.0  26.5  30.0   Platelets 150 - 400 K/uL  230       Most recent CMP    Latest Ref Rng & Units 08/25/2022    9:20 AM 08/20/2022    6:11 AM 08/19/2022   10:54 AM  CMP  Glucose 70 - 99 mg/dL 210  150  252   BUN 6 - 20 mg/dL 23  25  23    Creatinine 0.61 - 1.24 mg/dL 2.70  4.51  4.60   Sodium 135 - 145 mmol/L 140  138  137   Potassium 3.5 - 5.1 mmol/L 4.0  3.8  3.7   Chloride 98 - 111 mmol/L 100  100  94   CO2 22 - 32 mmol/L  24    Calcium 8.9 - 10.3 mg/dL  8.9    Total Protein 6.5 - 8.1 g/dL  6.6    Total Bilirubin 0.3 - 1.2 mg/dL  0.9    Alkaline Phos 38 - 126 U/L  242    AST 15 - 41 U/L  23    ALT 0 - 44 U/L  20      Renal function Estimated Creatinine Clearance: 38.6 mL/min (A) (by C-G formula based on SCr of 2.7 mg/dL (H)).  Hgb A1c MFr Bld (%)  Date Value  06/17/2022 7.7 (H)    LDL Chol Calc (NIH)  Date Value Ref Range Status  11/22/2019 57 0 - 99 mg/dL Final   Direct LDL  Date Value Ref Range Status  04/23/2022 115.0 mg/dL Final    Comment:    Optimal:  <100 mg/dLNear or Above Optimal:  100-129 mg/dLBorderline High:  130-159 mg/dLHigh:  160-189 mg/dLVery High:  >190 mg/dL    CT scan of the abdomen and pelvis personally reviewed The catheter does not appear kinked in the abdominal wall. The catheter does flip into the mid abdomen and appears nestled among loops of small intestine. I suspect the catheter will need to be pexied to the pelvis.  [name]. Stanford Breed, MD [facility] Vascular and Vein Specialists of [facility] Phone Number: [phone] 09/01/2022 2:39 PM   Total time spent on preparing this encounter including chart review, data review, collecting history, examining the patient, coordinating care for this established patient, 20 minutes.  Portions of this report may have been transcribed using voice recognition software.  Every effort has been made to ensure accuracy; however, inadvertent computerized transcription errors may still be  present.

## 2022-09-02 ENCOUNTER — Other Ambulatory Visit: Payer: Self-pay

## 2022-09-02 DIAGNOSIS — T85611A Breakdown (mechanical) of intraperitoneal dialysis catheter, initial encounter: Secondary | ICD-10-CM

## 2022-09-03 ENCOUNTER — Other Ambulatory Visit: Payer: Self-pay

## 2022-09-03 DIAGNOSIS — E1065 Type 1 diabetes mellitus with hyperglycemia: Secondary | ICD-10-CM

## 2022-09-03 MED ORDER — PENTIPS 32G X 4 MM MISC: 1.0000 "application " | Freq: Every day | 1 refills | Status: DC

## 2022-09-07 ENCOUNTER — Other Ambulatory Visit: Payer: Self-pay

## 2022-09-07 ENCOUNTER — Encounter (HOSPITAL_COMMUNITY): Payer: Self-pay | Admitting: Vascular Surgery

## 2022-09-07 NOTE — Progress Notes (Signed)
PCP - Dr. Avon Gully- [place]  Cardiologist - Denies  EP- Denies  Endocrine- Denies  Pulm- Denies  Chest x-ray - 08/19/22 (E)  EKG - 08/19/22 (E)  Stress Test - Denies  ECHO - 07/29/21 (E)  Cardiac Cath - Denies  AICD-na PM-na LOOP-na  Nerve Stimulator- Denies  Dialysis- M, T, TH, F, pt states he had a full session today 11/14  Sleep Study - Denies CPAP - Denies  LABS- 09/08/22: I-Stat 8  ASA- Denies  ERAS- No  HA1C- 06/17/22(E): 7.7 Fasting Blood Sugar - 62-270 Checks Blood Sugar __6___ times a day- Pt has a Freestyle to the LUA  Anesthesia- No  Pt denies having chest pain, sob, or fever during the pre-op phone call. All instructions explained to the pt, with a verbal understanding of the material. Pt also instructed to wear a mask and social distance if he goes out. The opportunity to ask questions was provided.

## 2022-09-07 NOTE — Progress Notes (Signed)
[name]- Instructions   Your procedure is scheduled on Wed., Nov. 15, 2023 from 8:30AM-10:00AM.  Report to [facility] Main Entrance "A" at 6:30 A.M., then check in with the Admitting office.  Call this number if you have problems the morning of surgery:  [phone]             If you experience any cold or flu symptoms such as cough, fever, chills, shortness of breath, etc. between now and your scheduled surgery, please notify us at the above         number.  Remember:  Do not eat  or drink after midnight on Nov. 14th    Take these medicines the morning of surgery with A SIP OF WATER: Atorvastatin (LIPITOR)  Gabapentin (NEURONTIN)  HydrALAZINE (APRESOLINE)  Metoprolol Tartrate  Pantoprazole (PROTONIX)   If Needed: Acetaminophen (TYLENOL)  Albuterol (VENTOLIN HFA)  ALPRAZolam (XANAX)  MetoCLOPramide (REGLAN)   As of today, STOP taking any Aspirin (unless otherwise instructed by your surgeon) Aleve, Naproxen, Ibuprofen, Motrin, Advil, Goody's, BC's, all herbal medications, fish oil, and all vitamins.  How to Manage Your Diabetes Before and After Surgery  How do I manage my blood sugar before surgery? Check your blood sugar the morning of your surgery when you wake up and every 2 hours until you get to the Short Stay unit. If your blood sugar is less than 70 mg/dL, you will need to treat for low blood sugar: Do not take insulin. Treat a low blood sugar (less than 70 mg/dL) with  cup of clear juice (cranberry or apple), 4 glucose tablets, OR glucose gel. Recheck blood sugar in 15 minutes after treatment (to make sure it is greater than 70 mg/dL). If your blood sugar is not greater than 70 mg/dL on recheck, call [phone]  for further instructions. Report your blood sugar to the short stay nurse when you get to Short Stay.  WHAT DO I DO ABOUT MY DIABETES MEDICATION?  THE NIGHT BEFORE SURGERY, take _____7______ units of _____NPH______insulin.      THE MORNING OF SURGERY, take  ________5_____ units of _____NPH_____insulin.  If your CBG is greater than 220 mg/dL, inform the staff upon arrival to Pre-Op.  Reviewed and Endorsed by [facility] Patient Education Committee, August 2015          Do not wear jewelry. Do not wear lotions, powders, cologne or deodorant. Do not shave 48 hours prior to surgery.  Men may shave face and neck. Do not bring valuables to the hospital.  [facility] is not responsible for any belongings or valuables.    Do NOT Smoke (Tobacco/Vaping)  24 hours prior to your procedure  If you use a CPAP at night, you may bring your mask for your overnight stay.   Contacts, glasses, hearing aids, dentures or partials may not be worn into surgery, please bring cases for these belongings   For patients admitted to the hospital, discharge time will be determined by your treatment team.   Patients discharged the day of surgery will not be allowed to drive home, and someone needs to stay with them for 24 hours.  Special instructions:    Oral Hygiene is also important to reduce your risk of infection.  Remember - BRUSH YOUR TEETH THE MORNING OF SURGERY WITH YOUR REGULAR TOOTHPASTE  [place]- Preparing For Surgery  Before surgery, you can play an important role. Because skin is not sterile, your skin needs to be as free of germs as possible. You  can reduce the number of germs on your skin by washing with Antibacterial Soap before surgery.     Please follow these instructions carefully.     Shower the NIGHT BEFORE SURGERY and the MORNING OF SURGERY with Antibacterial Soap.   Pat yourself dry with a CLEAN TOWEL.  Wear CLEAN PAJAMAS to bed the night before surgery  Place CLEAN SHEETS on your bed the night before your surgery  DO NOT SLEEP WITH PETS.  Day of Surgery:  Take a shower with Antibacterial soap. Wear Clean/Comfortable clothing the morning of surgery Do not apply any deodorants/lotions.   Remember to brush your teeth WITH YOUR  REGULAR TOOTHPASTE.   If you test positive for Covid, or been in contact with anyone that has tested positive in the last 10 days, please notify your surgeon.  SURGICAL WAITING ROOM VISITATION Patients having surgery or a procedure may have no more than 2 support people in the waiting area - these visitors may rotate.   Children under the age of 22 must have an adult with them who is not the patient. If the patient needs to stay at the hospital during part of their recovery, the visitor guidelines for inpatient rooms apply. Pre-op nurse will coordinate an appropriate time for 1 support person to accompany patient in pre-op.  This support person may not rotate.   Please refer to the [facility] website for the visitor guidelines for Inpatients (after your surgery is over and you are in a regular room).

## 2022-09-08 ENCOUNTER — Ambulatory Visit (HOSPITAL_BASED_OUTPATIENT_CLINIC_OR_DEPARTMENT_OTHER): Payer: 59 | Admitting: Anesthesiology

## 2022-09-08 ENCOUNTER — Encounter (HOSPITAL_COMMUNITY): Payer: Self-pay | Admitting: Vascular Surgery

## 2022-09-08 ENCOUNTER — Encounter (HOSPITAL_COMMUNITY)
Admission: RE | Disposition: A | Discharge status: Home / Self Care | Payer: Self-pay | Source: Home / Self Care | Attending: Vascular Surgery

## 2022-09-08 ENCOUNTER — Ambulatory Visit (HOSPITAL_COMMUNITY): Payer: 59 | Admitting: Anesthesiology

## 2022-09-08 ENCOUNTER — Other Ambulatory Visit: Payer: Self-pay

## 2022-09-08 ENCOUNTER — Ambulatory Visit (HOSPITAL_COMMUNITY)
Admission: RE | Admit: 2022-09-08 | Discharge: 2022-09-08 | Disposition: A | Discharge status: Home / Self Care | Payer: 59 | Attending: Vascular Surgery | Admitting: Vascular Surgery

## 2022-09-08 ENCOUNTER — Other Ambulatory Visit: Payer: Self-pay | Admitting: Physical Medicine & Rehabilitation

## 2022-09-08 ENCOUNTER — Other Ambulatory Visit (HOSPITAL_COMMUNITY): Payer: Self-pay

## 2022-09-08 DIAGNOSIS — Z794 Long term (current) use of insulin: Secondary | ICD-10-CM | POA: Insufficient documentation

## 2022-09-08 DIAGNOSIS — I120 Hypertensive chronic kidney disease with stage 5 chronic kidney disease or end stage renal disease: Secondary | ICD-10-CM | POA: Diagnosis not present

## 2022-09-08 DIAGNOSIS — Z87891 Personal history of nicotine dependence: Secondary | ICD-10-CM | POA: Insufficient documentation

## 2022-09-08 DIAGNOSIS — M1990 Unspecified osteoarthritis, unspecified site: Secondary | ICD-10-CM | POA: Insufficient documentation

## 2022-09-08 DIAGNOSIS — E1122 Type 2 diabetes mellitus with diabetic chronic kidney disease: Secondary | ICD-10-CM | POA: Insufficient documentation

## 2022-09-08 DIAGNOSIS — Z79899 Other long term (current) drug therapy: Secondary | ICD-10-CM | POA: Diagnosis not present

## 2022-09-08 DIAGNOSIS — N186 End stage renal disease: Secondary | ICD-10-CM | POA: Diagnosis not present

## 2022-09-08 DIAGNOSIS — F419 Anxiety disorder, unspecified: Secondary | ICD-10-CM | POA: Diagnosis not present

## 2022-09-08 DIAGNOSIS — Z992 Dependence on renal dialysis: Secondary | ICD-10-CM

## 2022-09-08 DIAGNOSIS — T85691A Other mechanical complication of intraperitoneal dialysis catheter, initial encounter: Secondary | ICD-10-CM | POA: Diagnosis not present

## 2022-09-08 DIAGNOSIS — T85611A Breakdown (mechanical) of intraperitoneal dialysis catheter, initial encounter: Secondary | ICD-10-CM | POA: Diagnosis present

## 2022-09-08 DIAGNOSIS — X58XXXA Exposure to other specified factors, initial encounter: Secondary | ICD-10-CM | POA: Insufficient documentation

## 2022-09-08 LAB — POCT I-STAT, CHEM 8
BUN: 25 mg/dL — ABNORMAL HIGH (ref 6–20)
Calcium, Ion: 1.04 mmol/L — ABNORMAL LOW (ref 1.15–1.40)
Chloride: 94 mmol/L — ABNORMAL LOW (ref 98–111)
Creatinine, Ser: 3.3 mg/dL — ABNORMAL HIGH (ref 0.61–1.24)
Glucose, Bld: 298 mg/dL — ABNORMAL HIGH (ref 70–99)
HCT: 29 % — ABNORMAL LOW (ref 39.0–52.0)
Hemoglobin: 9.9 g/dL — ABNORMAL LOW (ref 13.0–17.0)
Potassium: 4.5 mmol/L (ref 3.5–5.1)
Sodium: 136 mmol/L (ref 135–145)
TCO2: 31 mmol/L (ref 22–32)

## 2022-09-08 LAB — GLUCOSE, CAPILLARY
Glucose-Capillary: 319 mg/dL — ABNORMAL HIGH (ref 70–99)
Glucose-Capillary: 107 mg/dL — ABNORMAL HIGH (ref 70–99)
Glucose-Capillary: 83 mg/dL (ref 70–99)
Glucose-Capillary: 130 mg/dL — ABNORMAL HIGH (ref 70–99)
Glucose-Capillary: 69 mg/dL — ABNORMAL LOW (ref 70–99)

## 2022-09-08 SURGERY — REVISION, CATHETER, CAPD, LAPAROSCOPIC
Anesthesia: General

## 2022-09-08 MED ORDER — SODIUM CHLORIDE 0.9 % IR SOLN
Status: DC | PRN
  Administered 2022-09-08: 1000 mL

## 2022-09-08 MED ORDER — SUGAMMADEX SODIUM 200 MG/2ML IV SOLN
INTRAVENOUS | Status: DC | PRN
  Administered 2022-09-08: 200 mg via INTRAVENOUS

## 2022-09-08 MED ORDER — ORAL CARE MOUTH RINSE: 15.0000 mL | Freq: Once | OROMUCOSAL | Status: AC

## 2022-09-08 MED ORDER — EPHEDRINE SULFATE-NACL 50-0.9 MG/10ML-% IV SOSY
PREFILLED_SYRINGE | INTRAVENOUS | Status: DC | PRN
  Administered 2022-09-08 (×5): 5 mg via INTRAVENOUS

## 2022-09-08 MED ORDER — PROPOFOL 10 MG/ML IV BOLUS
INTRAVENOUS | Status: AC
  Filled 2022-09-08: qty 20

## 2022-09-08 MED ORDER — CHLORHEXIDINE GLUCONATE 4 % EX LIQD: 60.0000 mL | Freq: Once | CUTANEOUS | Status: DC

## 2022-09-08 MED ORDER — INSULIN ASPART 100 UNIT/ML IJ SOLN
0.0000 [IU] | INTRAMUSCULAR | Status: DC | PRN
  Administered 2022-09-08: 5 [IU] via SUBCUTANEOUS
  Filled 2022-09-08: qty 1

## 2022-09-08 MED ORDER — MIDAZOLAM HCL 2 MG/2ML IJ SOLN
INTRAMUSCULAR | Status: AC
  Filled 2022-09-08: qty 2

## 2022-09-08 MED ORDER — FENTANYL CITRATE (PF) 250 MCG/5ML IJ SOLN
INTRAMUSCULAR | Status: DC | PRN
  Administered 2022-09-08 (×2): 50 ug via INTRAVENOUS
  Administered 2022-09-08: 100 ug via INTRAVENOUS
  Administered 2022-09-08: 50 ug via INTRAVENOUS

## 2022-09-08 MED ORDER — DIPHENHYDRAMINE HCL 50 MG/ML IJ SOLN
INTRAMUSCULAR | Status: DC | PRN
  Administered 2022-09-08: 12.5 mg via INTRAVENOUS

## 2022-09-08 MED ORDER — MIDAZOLAM HCL 5 MG/5ML IJ SOLN
INTRAMUSCULAR | Status: DC | PRN
  Administered 2022-09-08: 2 mg via INTRAVENOUS

## 2022-09-08 MED ORDER — DEXTROSE 50 % IV SOLN: 25.0000 mL | Freq: Once | INTRAVENOUS | Status: AC

## 2022-09-08 MED ORDER — LIDOCAINE HCL (CARDIAC) PF 100 MG/5ML IV SOSY
PREFILLED_SYRINGE | INTRAVENOUS | Status: DC | PRN
  Administered 2022-09-08: 60 mg via INTRATRACHEAL

## 2022-09-08 MED ORDER — OXYCODONE HCL 5 MG PO TABS
5.0000 mg | ORAL_TABLET | Freq: Four times a day (QID) | ORAL | 0 refills | Status: DC | PRN
  Filled 2022-09-08: qty 20, 5d supply, fill #0

## 2022-09-08 MED ORDER — CEFAZOLIN SODIUM-DEXTROSE 2-4 GM/100ML-% IV SOLN
2.0000 g | INTRAVENOUS | Status: AC
  Administered 2022-09-08: 2 g via INTRAVENOUS
  Filled 2022-09-08: qty 100

## 2022-09-08 MED ORDER — CHLORHEXIDINE GLUCONATE 0.12 % MT SOLN
15.0000 mL | Freq: Once | OROMUCOSAL | Status: AC
  Administered 2022-09-08: 15 mL via OROMUCOSAL
  Filled 2022-09-08: qty 15

## 2022-09-08 MED ORDER — DEXTROSE 50 % IV SOLN
INTRAVENOUS | Status: AC
  Administered 2022-09-08: 25 mL via INTRAVENOUS
  Filled 2022-09-08: qty 50

## 2022-09-08 MED ORDER — PHENYLEPHRINE 80 MCG/ML (10ML) SYRINGE FOR IV PUSH (FOR BLOOD PRESSURE SUPPORT)
PREFILLED_SYRINGE | INTRAVENOUS | Status: AC
  Filled 2022-09-08: qty 10

## 2022-09-08 MED ORDER — 0.9 % SODIUM CHLORIDE (POUR BTL) OPTIME
TOPICAL | Status: DC | PRN
  Administered 2022-09-08: 1000 mL

## 2022-09-08 MED ORDER — ONDANSETRON HCL 4 MG/2ML IJ SOLN
INTRAMUSCULAR | Status: DC | PRN
  Administered 2022-09-08: 4 mg via INTRAVENOUS

## 2022-09-08 MED ORDER — SODIUM CHLORIDE 0.9 % IV SOLN: INTRAVENOUS | Status: DC

## 2022-09-08 MED ORDER — PROPOFOL 10 MG/ML IV BOLUS
INTRAVENOUS | Status: DC | PRN
  Administered 2022-09-08: 140 mg via INTRAVENOUS

## 2022-09-08 MED ORDER — PHENYLEPHRINE 80 MCG/ML (10ML) SYRINGE FOR IV PUSH (FOR BLOOD PRESSURE SUPPORT)
PREFILLED_SYRINGE | INTRAVENOUS | Status: DC | PRN
  Administered 2022-09-08: 80 ug via INTRAVENOUS

## 2022-09-08 MED ORDER — ROCURONIUM BROMIDE 10 MG/ML (PF) SYRINGE
PREFILLED_SYRINGE | INTRAVENOUS | Status: DC | PRN
  Administered 2022-09-08: 60 mg via INTRAVENOUS

## 2022-09-08 MED ORDER — FENTANYL CITRATE (PF) 250 MCG/5ML IJ SOLN
INTRAMUSCULAR | Status: AC
  Filled 2022-09-08: qty 5

## 2022-09-08 SURGICAL SUPPLY — 50 items
ADAPTER TITANIUM MEDIONICS (MISCELLANEOUS) IMPLANT
ADH SKN CLS APL DERMABOND .7 (GAUZE/BANDAGES/DRESSINGS) ×1
ADPR DLYS CATH STRL LF DISP (MISCELLANEOUS)
APL PRP STRL LF DISP 70% ISPRP (MISCELLANEOUS) ×1
BAG DECANTER FOR FLEXI CONT (MISCELLANEOUS) ×1 IMPLANT
BIOPATCH RED 1 DISK 7.0 (GAUZE/BANDAGES/DRESSINGS) ×1 IMPLANT
BLADE CLIPPER SURG (BLADE) IMPLANT
BLADE SURG 11 STRL SS (BLADE) ×1 IMPLANT
CATH EXTENDED DIALYSIS (CATHETERS) IMPLANT
CHLORAPREP W/TINT 26 (MISCELLANEOUS) ×1 IMPLANT
COVER PROBE W GEL 5X96 (DRAPES) IMPLANT
COVER SURGICAL LIGHT HANDLE (MISCELLANEOUS) ×1 IMPLANT
DERMABOND ADVANCED .7 DNX12 (GAUZE/BANDAGES/DRESSINGS) IMPLANT
ELECT CAUTERY BLADE 6.4 (BLADE) IMPLANT
ELECT REM PT RETURN 9FT ADLT (ELECTROSURGICAL) ×1
ELECTRODE REM PT RTRN 9FT ADLT (ELECTROSURGICAL) ×1 IMPLANT
GAUZE SPONGE 4X4 12PLY STRL (GAUZE/BANDAGES/DRESSINGS) ×1 IMPLANT
GLOVE BIO SURGEON STRL SZ8 (GLOVE) ×1 IMPLANT
GLOVE INDICATOR 6.5 STRL GRN (GLOVE) ×1 IMPLANT
GOWN STRL REUS W/ TWL LRG LVL3 (GOWN DISPOSABLE) ×3 IMPLANT
GOWN STRL REUS W/ TWL XL LVL3 (GOWN DISPOSABLE) ×1 IMPLANT
GOWN STRL REUS W/TWL LRG LVL3 (GOWN DISPOSABLE) ×3
GOWN STRL REUS W/TWL XL LVL3 (GOWN DISPOSABLE) ×1
GRASPER SUT TROCAR 14GX15 (MISCELLANEOUS) IMPLANT
IV NS 1000ML (IV SOLUTION)
IV NS 1000ML BAXH (IV SOLUTION) IMPLANT
KIT BASIN OR (CUSTOM PROCEDURE TRAY) ×1 IMPLANT
KIT TURNOVER KIT B (KITS) ×1 IMPLANT
NDL INSUFFLATION 14GA 120MM (NEEDLE) ×1 IMPLANT
NEEDLE INSUFFLATION 14GA 120MM (NEEDLE) ×1 IMPLANT
NS IRRIG 1000ML POUR BTL (IV SOLUTION) ×1 IMPLANT
PAD ARMBOARD 7.5X6 YLW CONV (MISCELLANEOUS) ×2 IMPLANT
PENCIL BUTTON HOLSTER BLD 10FT (ELECTRODE) IMPLANT
SET CYSTO W/LG BORE CLAMP LF (SET/KITS/TRAYS/PACK) ×1 IMPLANT
SET TUBE SMOKE EVAC HIGH FLOW (TUBING) ×1 IMPLANT
SLEEVE ENDOPATH XCEL 5M (ENDOMECHANICALS) ×1 IMPLANT
SPIKE FLUID TRANSFER (MISCELLANEOUS) ×1 IMPLANT
STYLET FALLER (MISCELLANEOUS) IMPLANT
SUT MNCRL AB 4-0 PS2 18 (SUTURE) ×2 IMPLANT
SUT PROLENE 0 SH 30 (SUTURE) ×1 IMPLANT
SUT VIC AB 3-0 SH 27 (SUTURE)
SUT VIC AB 3-0 SH 27X BRD (SUTURE) IMPLANT
SYR 20CC LL (SYRINGE) ×1 IMPLANT
TAPE CLOTH SURG 4X10 WHT LF (GAUZE/BANDAGES/DRESSINGS) ×1 IMPLANT
TOWEL GREEN STERILE (TOWEL DISPOSABLE) ×1 IMPLANT
TOWEL GREEN STERILE FF (TOWEL DISPOSABLE) ×1 IMPLANT
TRAY LAPAROSCOPIC MC (CUSTOM PROCEDURE TRAY) ×1 IMPLANT
TROCAR 5MMX150MM (TROCAR) ×1 IMPLANT
TROCAR XCEL NON-BLD 5MMX100MML (ENDOMECHANICALS) ×1 IMPLANT
WATER STERILE IRR 1000ML POUR (IV SOLUTION) ×1 IMPLANT

## 2022-09-08 NOTE — Anesthesia Procedure Notes (Signed)
Procedure Name: Intubation Date/Time: 09/08/2022 8:39 AM  Performed by: Mariea Clonts, CRNAPre-anesthesia Checklist: Patient identified, Emergency Drugs available, Suction available and Patient being monitored Patient Re-evaluated:Patient Re-evaluated prior to induction Oxygen Delivery Method: Circle System Utilized Preoxygenation: Pre-oxygenation with 100% oxygen Induction Type: IV induction and Cricoid Pressure applied Ventilation: Mask ventilation without difficulty Laryngoscope Size: Miller and 2 Grade View: Grade II Tube type: Oral Tube size: 7.5 mm Number of attempts: 1 Airway Equipment and Method: Stylet and Oral airway Placement Confirmation: ETT inserted through vocal cords under direct vision, positive ETCO2 and breath sounds checked- equal and bilateral Tube secured with: Tape Dental Injury: Teeth and Oropharynx as per pre-operative assessment

## 2022-09-08 NOTE — Interval H&P Note (Signed)
History and Physical Interval Note:  09/08/2022 8:12 AM  Jose [name]  has presented today for surgery, with the diagnosis of Malfunctioning peritoneal dialysis catheter.  The various methods of treatment have been discussed with the patient and family. After consideration of risks, benefits and other options for treatment, the patient has consented to  Procedure(s): DIAGNOSTIC LAPAROSCOPY   AND REVISION OF PERITONEAL DIALYSIS CATHETER (N/A) as a surgical intervention.  The patient's history has been reviewed, patient examined, no change in status, stable for surgery.  I have reviewed the patient's chart and labs.  Questions were answered to the patient's satisfaction.     [name]

## 2022-09-08 NOTE — Op Note (Signed)
DATE OF SERVICE: 09/08/2022  PATIENT:  Jose [name]  46 y.o. male  PRE-OPERATIVE DIAGNOSIS:  ESRD; malfunctioning PD catheter  POST-OPERATIVE DIAGNOSIS:  Same  PROCEDURE:   1) diagnostic laparoscopy 2) revision of peritoneal dialysis catheter  SURGEON:  Surgeon(s) and Role:    * [name], MD - Primary  ASSISTANT: Gerri Lins, PA-C  An experienced assistant was required given the complexity of this procedure and the standard of surgical care. My assistant helped with exposure through counter tension, suctioning, ligation and retraction to better visualize the surgical field.  My assistant expedited sewing during the case by following my sutures. Wherever I use the term "we" in the report, my assistant actively helped me with that portion of the procedure.  ANESTHESIA:   general  EBL: minimal  BLOOD ADMINISTERED:none  DRAINS: none   LOCAL MEDICATIONS USED:  NONE  SPECIMEN:  none  COUNTS: confirmed correct.  TOURNIQUET:  none  PATIENT DISPOSITION:  PACU - hemodynamically stable.   Delay start of Pharmacological VTE agent (>24hrs) due to surgical blood loss or risk of bleeding: no  INDICATION FOR PROCEDURE: Jose [name] is a 46 y.o. male with ESRD. I recently placed a peritoneal dialysis catheter which is now flushing, but not draining. A CT scan showed the catheter flipped into the right lower quadrant, where I had done lysis of adhesions. After careful discussion of risks, benefits, and alternatives the patient was offered diagnostic laparoscopy and revision of the catheter. The patient understood and wished to proceed.  OPERATIVE FINDINGS: PD catheter flipped into right lower quadrant.  Intra-abdominal portion of catheter excised.  I put in a new intra-abdominal portion that was shorter to avoid it from flipping into the abdomen.  The catheter worked well at completion.  DESCRIPTION OF PROCEDURE: After identification of the patient in the pre-operative  holding area, the patient was transferred to the operating room. The patient was positioned supine on the operating room table. Anesthesia was induced. The abdomen was prepped and draped in standard fashion. A surgical pause was performed confirming correct patient, procedure, and operative location.  A Veress needle was introduced into the abdomen at Palmer's point, immediately below the left costal margin.  A saline drop test was used to confirm intra-abdominal position.  The Veress needle was connected to insufflation tubing and insufflation initiated.  A low opening pressure and good flow rate were noted.  A 5 mm trocar was introduced into the left upper quadrant using Visi-View technique.  The obturator was removed and the abdomen inspected with a 30 degree angled laparoscope.  No evidence of Veress needle injury was noted.  An additional 5 mm trocar was inserted a handsbreadth away to facilitate the case.  I inspected the abdomen.  The catheter.  Adherent to the previous lysis of adhesions.  I removed the catheter from the adhesions.  The catheter appeared too long in the intra-abdominal portion.  I elected to excise this portion and put in a new intra-abdominal piece.  A transverse incision was made over the cuff and the suprapubic abdomen.  The intra-abdominal catheter was removed completely.  A was introduced to create a new trial track in the suprapubic abdomen.  I used the existing hole in the peritoneal cavity for the new catheter.  The new catheter was significantly shortened.  I reattached the new catheter to the Christmas tree adapter and secured it with a 0 Prolene suture.  The abdomen was reinsufflated.  The peritoneum was inspected to  ensure the catheter did not enter the cavity.  Satisfied we desufflated the abdomen.  The catheter was connected to cystoscopy tubing.  The catheter flushed and drained without any difficulty.  The trochars were removed.  All incisions were closed with  interrupted 4-0 Monocryl sutures.  Dermabond was applied.  A sterile bandage was applied to the peritoneal dialysis catheter.   Upon completion of the case instrument and sharps counts were confirmed correct. The patient was transferred to the PACU in good condition. I was present for all portions of the procedure.  [name]. Stanford Breed, MD [facility] and [facility] Special[facility] Phone Number: [phone] 09/08/2022 9:41 AM

## 2022-09-08 NOTE — Transfer of Care (Signed)
Immediate Anesthesia Transfer of Care Note  Patient: ANDRIUS ANDREPONT  Procedure(s) Performed: DIAGNOSTIC LAPAROSCOPY   AND REVISION OF PERITONEAL DIALYSIS CATHETER  Patient Location: PACU  Anesthesia Type:General  Level of Consciousness: awake, alert , and oriented  Airway & Oxygen Therapy: Patient Spontanous Breathing and Patient connected to face mask oxygen  Post-op Assessment: Report given to RN and Post -op Vital signs reviewed and stable  Post vital signs: Reviewed and stable  Last Vitals:  Vitals Value Taken Time  BP    Temp    Pulse 69 09/08/22 0954  Resp 12 09/08/22 0954  SpO2 94 % 09/08/22 0954  Vitals shown include unvalidated device data.  Last Pain:  Vitals:   09/08/22 0713  TempSrc:   PainSc: 0-No pain      Patients Stated Pain Goal: 0 (29/09/03 0149)  Complications: No notable events documented.

## 2022-09-08 NOTE — Progress Notes (Signed)
CBG 319 upon arrival to Short Stay. Dr. Smith Robert made aware per Peri-op Glycemic Control Protocol. Dr. Smith Robert made aware that patient did not take insulin last night because he fell asleep but did take 10 units of 70/30 NPH-regular at 0530 this morning. Proceed with protocol per Dr. Smith Robert.

## 2022-09-08 NOTE — Anesthesia Preprocedure Evaluation (Addendum)
Anesthesia Evaluation  Patient identified by MRN, date of birth, ID band Patient awake    Reviewed: Allergy & Precautions, NPO status , Patient's Chart, lab work & pertinent test results  Airway Mallampati: II  TM Distance: >3 FB Neck ROM: Full    Dental  (+) Dental Advisory Given, Teeth Intact   Pulmonary former smoker   breath sounds clear to auscultation       Cardiovascular hypertension,  Rhythm:Regular Rate:Normal     Neuro/Psych  Headaches PSYCHIATRIC DISORDERS Anxiety        GI/Hepatic Neg liver ROS, PUD,,,  Endo/Other  diabetes    Renal/GU ESRF and DialysisRenal disease     Musculoskeletal  (+) Arthritis ,    Abdominal   Peds  Hematology   Anesthesia Other Findings   Reproductive/Obstetrics                             Anesthesia Physical Anesthesia Plan  ASA: 3  Anesthesia Plan: General   Post-op Pain Management:    Induction: Intravenous  PONV Risk Score and Plan: 3 and Ondansetron, Dexamethasone and Midazolam  Airway Management Planned: Oral ETT  Additional Equipment: None  Intra-op Plan:   Post-operative Plan: Extubation in OR  Informed Consent: I have reviewed the patients History and Physical, chart, labs and discussed the procedure including the risks, benefits and alternatives for the proposed anesthesia with the patient or authorized representative who has indicated his/her understanding and acceptance.     Dental advisory given  Plan Discussed with: CRNA  Anesthesia Plan Comments:        Anesthesia Quick Evaluation

## 2022-09-08 NOTE — Progress Notes (Signed)
Hypoglycemic Event  CBG: 69  Treatment: D50 25 mL (12.5 gm)  Symptoms: Shaky and Vision changes  Follow-up CBG: [id number] CBG Result:130  Possible Reasons for Event: Inadequate meal intake and Medication regimen:    Comments/MD notified:Dr Smith Robert see orders    Signe Colt

## 2022-09-09 NOTE — Anesthesia Postprocedure Evaluation (Signed)
Anesthesia Post Note  Patient: Jose [name]  Procedure(s) Performed: DIAGNOSTIC LAPAROSCOPY   AND REVISION OF PERITONEAL DIALYSIS CATHETER     Patient location during evaluation: PACU Anesthesia Type: General Level of consciousness: awake and alert Pain management: pain level controlled Vital Signs Assessment: post-procedure vital signs reviewed and stable Respiratory status: spontaneous breathing, nonlabored ventilation, respiratory function stable and patient connected to nasal cannula oxygen Cardiovascular status: blood pressure returned to baseline and stable Postop Assessment: no apparent nausea or vomiting Anesthetic complications: no   No notable events documented.  Last Vitals:  Vitals:   09/08/22 1015 09/08/22 1100  BP: (!) 162/85 (!) 141/80  Pulse: 68 69  Resp: (!) 9 19  Temp:  36.5 C  SpO2: 99% 95%    Last Pain:  Vitals:   09/08/22 1100  TempSrc:   PainSc: 0-No pain                 Effie Berkshire

## 2022-09-13 ENCOUNTER — Encounter (HOSPITAL_COMMUNITY): Payer: Self-pay | Admitting: Psychiatry

## 2022-09-13 ENCOUNTER — Inpatient Hospital Stay (HOSPITAL_COMMUNITY)
Admission: EM | Admit: 2022-09-13 | Discharge: 2022-09-17 | DRG: 073 | Disposition: A | Discharge status: Home / Self Care | Payer: 59 | Attending: Family Medicine | Admitting: Family Medicine

## 2022-09-13 ENCOUNTER — Emergency Department (HOSPITAL_COMMUNITY): Payer: 59

## 2022-09-13 DIAGNOSIS — Z888 Allergy status to other drugs, medicaments and biological substances status: Secondary | ICD-10-CM

## 2022-09-13 DIAGNOSIS — E1165 Type 2 diabetes mellitus with hyperglycemia: Secondary | ICD-10-CM | POA: Diagnosis present

## 2022-09-13 DIAGNOSIS — J9811 Atelectasis: Secondary | ICD-10-CM | POA: Diagnosis present

## 2022-09-13 DIAGNOSIS — N2581 Secondary hyperparathyroidism of renal origin: Secondary | ICD-10-CM | POA: Diagnosis present

## 2022-09-13 DIAGNOSIS — E876 Hypokalemia: Secondary | ICD-10-CM | POA: Diagnosis present

## 2022-09-13 DIAGNOSIS — I161 Hypertensive emergency: Secondary | ICD-10-CM | POA: Diagnosis present

## 2022-09-13 DIAGNOSIS — Z818 Family history of other mental and behavioral disorders: Secondary | ICD-10-CM

## 2022-09-13 DIAGNOSIS — G44209 Tension-type headache, unspecified, not intractable: Secondary | ICD-10-CM | POA: Diagnosis present

## 2022-09-13 DIAGNOSIS — E8770 Fluid overload, unspecified: Secondary | ICD-10-CM | POA: Diagnosis present

## 2022-09-13 DIAGNOSIS — T85631A Leakage of intraperitoneal dialysis catheter, initial encounter: Secondary | ICD-10-CM | POA: Diagnosis present

## 2022-09-13 DIAGNOSIS — E1143 Type 2 diabetes mellitus with diabetic autonomic (poly)neuropathy: Secondary | ICD-10-CM | POA: Diagnosis not present

## 2022-09-13 DIAGNOSIS — T85611A Breakdown (mechanical) of intraperitoneal dialysis catheter, initial encounter: Secondary | ICD-10-CM | POA: Diagnosis present

## 2022-09-13 DIAGNOSIS — K3184 Gastroparesis: Secondary | ICD-10-CM | POA: Diagnosis present

## 2022-09-13 DIAGNOSIS — N400 Enlarged prostate without lower urinary tract symptoms: Secondary | ICD-10-CM | POA: Diagnosis present

## 2022-09-13 DIAGNOSIS — M25511 Pain in right shoulder: Secondary | ICD-10-CM | POA: Diagnosis present

## 2022-09-13 DIAGNOSIS — Y636 Underdosing and nonadministration of necessary drug, medicament or biological substance: Secondary | ICD-10-CM | POA: Diagnosis present

## 2022-09-13 DIAGNOSIS — I700 Atherosclerosis of aorta: Secondary | ICD-10-CM | POA: Diagnosis present

## 2022-09-13 DIAGNOSIS — E11319 Type 2 diabetes mellitus with unspecified diabetic retinopathy without macular edema: Secondary | ICD-10-CM | POA: Diagnosis present

## 2022-09-13 DIAGNOSIS — Z886 Allergy status to analgesic agent status: Secondary | ICD-10-CM

## 2022-09-13 DIAGNOSIS — Z823 Family history of stroke: Secondary | ICD-10-CM

## 2022-09-13 DIAGNOSIS — K8020 Calculus of gallbladder without cholecystitis without obstruction: Secondary | ICD-10-CM | POA: Diagnosis present

## 2022-09-13 DIAGNOSIS — F411 Generalized anxiety disorder: Secondary | ICD-10-CM | POA: Diagnosis present

## 2022-09-13 DIAGNOSIS — I10 Essential (primary) hypertension: Secondary | ICD-10-CM | POA: Diagnosis present

## 2022-09-13 DIAGNOSIS — Z83438 Family history of other disorder of lipoprotein metabolism and other lipidemia: Secondary | ICD-10-CM

## 2022-09-13 DIAGNOSIS — Z992 Dependence on renal dialysis: Secondary | ICD-10-CM

## 2022-09-13 DIAGNOSIS — Z794 Long term (current) use of insulin: Secondary | ICD-10-CM

## 2022-09-13 DIAGNOSIS — Z91148 Patient's other noncompliance with medication regimen for other reason: Secondary | ICD-10-CM

## 2022-09-13 DIAGNOSIS — R112 Nausea with vomiting, unspecified: Secondary | ICD-10-CM | POA: Diagnosis present

## 2022-09-13 DIAGNOSIS — D631 Anemia in chronic kidney disease: Secondary | ICD-10-CM | POA: Diagnosis present

## 2022-09-13 DIAGNOSIS — E1122 Type 2 diabetes mellitus with diabetic chronic kidney disease: Secondary | ICD-10-CM

## 2022-09-13 DIAGNOSIS — E785 Hyperlipidemia, unspecified: Secondary | ICD-10-CM | POA: Diagnosis present

## 2022-09-13 DIAGNOSIS — R9431 Abnormal electrocardiogram [ECG] [EKG]: Secondary | ICD-10-CM | POA: Diagnosis present

## 2022-09-13 DIAGNOSIS — K5900 Constipation, unspecified: Secondary | ICD-10-CM | POA: Diagnosis present

## 2022-09-13 DIAGNOSIS — Z9109 Other allergy status, other than to drugs and biological substances: Secondary | ICD-10-CM

## 2022-09-13 DIAGNOSIS — D72829 Elevated white blood cell count, unspecified: Secondary | ICD-10-CM | POA: Diagnosis not present

## 2022-09-13 DIAGNOSIS — Z833 Family history of diabetes mellitus: Secondary | ICD-10-CM

## 2022-09-13 DIAGNOSIS — Z87891 Personal history of nicotine dependence: Secondary | ICD-10-CM

## 2022-09-13 DIAGNOSIS — F32A Depression, unspecified: Secondary | ICD-10-CM | POA: Diagnosis present

## 2022-09-13 DIAGNOSIS — Z91158 Patient's noncompliance with renal dialysis for other reason: Secondary | ICD-10-CM

## 2022-09-13 DIAGNOSIS — Y812 Prosthetic and other implants, materials and accessory general- and plastic-surgery devices associated with adverse incidents: Secondary | ICD-10-CM | POA: Diagnosis present

## 2022-09-13 DIAGNOSIS — I120 Hypertensive chronic kidney disease with stage 5 chronic kidney disease or end stage renal disease: Secondary | ICD-10-CM | POA: Diagnosis present

## 2022-09-13 DIAGNOSIS — N186 End stage renal disease: Secondary | ICD-10-CM

## 2022-09-13 DIAGNOSIS — Z79899 Other long term (current) drug therapy: Secondary | ICD-10-CM

## 2022-09-13 DIAGNOSIS — I160 Hypertensive urgency: Secondary | ICD-10-CM

## 2022-09-13 DIAGNOSIS — K219 Gastro-esophageal reflux disease without esophagitis: Secondary | ICD-10-CM | POA: Diagnosis present

## 2022-09-13 LAB — CBC WITH DIFFERENTIAL/PLATELET
Abs Immature Granulocytes: 0.02 10*3/uL (ref 0.00–0.07)
Basophils Absolute: 0 10*3/uL (ref 0.0–0.1)
Basophils Relative: 1 %
Eosinophils Absolute: 0.4 10*3/uL (ref 0.0–0.5)
Eosinophils Relative: 5 %
HCT: 26.5 % — ABNORMAL LOW (ref 39.0–52.0)
Hemoglobin: 9.2 g/dL — ABNORMAL LOW (ref 13.0–17.0)
Immature Granulocytes: 0 %
Lymphocytes Relative: 23 %
Lymphs Abs: 1.8 10*3/uL (ref 0.7–4.0)
MCH: 30.1 pg (ref 26.0–34.0)
MCHC: 34.7 g/dL (ref 30.0–36.0)
MCV: 86.6 fL (ref 80.0–100.0)
Monocytes Absolute: 0.8 10*3/uL (ref 0.1–1.0)
Monocytes Relative: 11 %
Neutro Abs: 4.9 10*3/uL (ref 1.7–7.7)
Neutrophils Relative %: 60 %
Platelets: 208 10*3/uL (ref 150–400)
RBC: 3.06 MIL/uL — ABNORMAL LOW (ref 4.22–5.81)
RDW: 13.3 % (ref 11.5–15.5)
WBC: 8 10*3/uL (ref 4.0–10.5)
nRBC: 0 % (ref 0.0–0.2)

## 2022-09-13 LAB — COMPREHENSIVE METABOLIC PANEL
ALT: 16 U/L (ref 0–44)
AST: 25 U/L (ref 15–41)
Albumin: 3.3 g/dL — ABNORMAL LOW (ref 3.5–5.0)
Alkaline Phosphatase: 155 U/L — ABNORMAL HIGH (ref 38–126)
Anion gap: 11 (ref 5–15)
BUN: 15 mg/dL (ref 6–20)
CO2: 28 mmol/L (ref 22–32)
Calcium: 7.9 mg/dL — ABNORMAL LOW (ref 8.9–10.3)
Chloride: 95 mmol/L — ABNORMAL LOW (ref 98–111)
Creatinine, Ser: 2.3 mg/dL — ABNORMAL HIGH (ref 0.61–1.24)
GFR, Estimated: 34 mL/min — ABNORMAL LOW (ref >60)
Glucose, Bld: 226 mg/dL — ABNORMAL HIGH (ref 70–99)
Potassium: 4.2 mmol/L (ref 3.5–5.1)
Sodium: 134 mmol/L — ABNORMAL LOW (ref 135–145)
Total Bilirubin: 0.5 mg/dL (ref 0.3–1.2)
Total Protein: 6.3 g/dL — ABNORMAL LOW (ref 6.5–8.1)

## 2022-09-13 MED ORDER — LABETALOL HCL 5 MG/ML IV SOLN
10.0000 mg | Freq: Once | INTRAVENOUS | Status: AC
  Administered 2022-09-13: 10 mg via INTRAVENOUS
  Filled 2022-09-13: qty 4

## 2022-09-13 MED ORDER — FENTANYL CITRATE PF 50 MCG/ML IJ SOSY
50.0000 ug | PREFILLED_SYRINGE | Freq: Once | INTRAMUSCULAR | Status: AC
  Administered 2022-09-13: 50 ug via INTRAVENOUS
  Filled 2022-09-13: qty 1

## 2022-09-13 MED ORDER — PANTOPRAZOLE SODIUM 40 MG PO TBEC
40.0000 mg | DELAYED_RELEASE_TABLET | Freq: Every day | ORAL | Status: DC
  Administered 2022-09-15 – 2022-09-17 (×3): 40 mg via ORAL
  Filled 2022-09-13 (×4): qty 1

## 2022-09-13 MED ORDER — ATORVASTATIN CALCIUM 40 MG PO TABS
40.0000 mg | ORAL_TABLET | Freq: Every day | ORAL | Status: DC
  Administered 2022-09-14 – 2022-09-17 (×4): 40 mg via ORAL
  Filled 2022-09-13 (×4): qty 1

## 2022-09-13 MED ORDER — PROCHLORPERAZINE EDISYLATE 10 MG/2ML IJ SOLN
10.0000 mg | Freq: Four times a day (QID) | INTRAMUSCULAR | Status: DC | PRN
  Administered 2022-09-13 – 2022-09-14 (×2): 10 mg via INTRAVENOUS
  Filled 2022-09-13 (×2): qty 2

## 2022-09-13 MED ORDER — PROCHLORPERAZINE EDISYLATE 10 MG/2ML IJ SOLN
10.0000 mg | Freq: Once | INTRAMUSCULAR | Status: AC
  Administered 2022-09-13: 10 mg via INTRAVENOUS
  Filled 2022-09-13: qty 2

## 2022-09-13 MED ORDER — GABAPENTIN 100 MG PO CAPS
200.0000 mg | ORAL_CAPSULE | Freq: Two times a day (BID) | ORAL | Status: DC
  Administered 2022-09-13 – 2022-09-17 (×8): 200 mg via ORAL
  Filled 2022-09-13 (×8): qty 2

## 2022-09-13 MED ORDER — DIPHENHYDRAMINE HCL 50 MG/ML IJ SOLN
25.0000 mg | Freq: Once | INTRAMUSCULAR | Status: AC
  Administered 2022-09-13: 25 mg via INTRAVENOUS
  Filled 2022-09-13: qty 1

## 2022-09-13 MED ORDER — POLYETHYLENE GLYCOL 3350 17 G PO PACK: 17.0000 g | PACK | Freq: Every day | ORAL | Status: DC | PRN

## 2022-09-13 MED ORDER — HEPARIN SODIUM (PORCINE) 5000 UNIT/ML IJ SOLN
5000.0000 [IU] | Freq: Three times a day (TID) | INTRAMUSCULAR | Status: DC
  Administered 2022-09-13 – 2022-09-17 (×9): 5000 [IU] via SUBCUTANEOUS
  Filled 2022-09-13 (×9): qty 1

## 2022-09-13 MED ORDER — NICARDIPINE HCL IN NACL 20-0.86 MG/200ML-% IV SOLN
3.0000 mg/h | INTRAVENOUS | Status: DC
  Administered 2022-09-13: 5 mg/h via INTRAVENOUS
  Filled 2022-09-13: qty 200

## 2022-09-13 MED ORDER — INSULIN ASPART PROT & ASPART (70-30 MIX) 100 UNIT/ML ~~LOC~~ SUSP
12.0000 [IU] | Freq: Two times a day (BID) | SUBCUTANEOUS | Status: DC
  Filled 2022-09-13: qty 10

## 2022-09-13 MED ORDER — DICLOFENAC SODIUM 1 % EX GEL
2.0000 g | Freq: Three times a day (TID) | CUTANEOUS | Status: DC
  Administered 2022-09-14 – 2022-09-17 (×4): 2 g via TOPICAL
  Filled 2022-09-13 (×2): qty 100

## 2022-09-13 MED ORDER — PAROXETINE HCL 20 MG PO TABS
20.0000 mg | ORAL_TABLET | Freq: Every day | ORAL | Status: DC
  Administered 2022-09-13: 20 mg via ORAL
  Filled 2022-09-13: qty 1

## 2022-09-13 MED ORDER — HYDRALAZINE HCL 50 MG PO TABS
50.0000 mg | ORAL_TABLET | Freq: Three times a day (TID) | ORAL | Status: DC
  Administered 2022-09-13 – 2022-09-17 (×9): 50 mg via ORAL
  Filled 2022-09-13 (×5): qty 1
  Filled 2022-09-13: qty 2
  Filled 2022-09-13 (×3): qty 1

## 2022-09-13 MED ORDER — METOPROLOL TARTRATE 25 MG PO TABS
75.0000 mg | ORAL_TABLET | Freq: Two times a day (BID) | ORAL | Status: DC
  Administered 2022-09-13 – 2022-09-17 (×8): 75 mg via ORAL
  Filled 2022-09-13 (×9): qty 3

## 2022-09-13 MED ORDER — ALBUTEROL SULFATE (2.5 MG/3ML) 0.083% IN NEBU: 3.0000 mL | INHALATION_SOLUTION | RESPIRATORY_TRACT | Status: DC | PRN

## 2022-09-13 MED ORDER — LABETALOL HCL 5 MG/ML IV SOLN
5.0000 mg | Freq: Once | INTRAVENOUS | Status: AC
  Administered 2022-09-13: 5 mg via INTRAVENOUS
  Filled 2022-09-13: qty 4

## 2022-09-13 MED ORDER — TORSEMIDE 20 MG PO TABS: 40.0000 mg | ORAL_TABLET | Freq: Every day | ORAL | Status: DC

## 2022-09-13 MED ORDER — HYDRALAZINE HCL 20 MG/ML IJ SOLN
20.0000 mg | Freq: Once | INTRAMUSCULAR | Status: AC
  Administered 2022-09-13: 20 mg via INTRAVENOUS
  Filled 2022-09-13: qty 1

## 2022-09-13 MED ORDER — INSULIN ASPART 100 UNIT/ML IJ SOLN
0.0000 [IU] | Freq: Three times a day (TID) | INTRAMUSCULAR | Status: DC
  Administered 2022-09-14 – 2022-09-15 (×3): 2 [IU] via SUBCUTANEOUS
  Administered 2022-09-15: 1 [IU] via SUBCUTANEOUS
  Administered 2022-09-16: 2 [IU] via SUBCUTANEOUS
  Administered 2022-09-16 – 2022-09-17 (×2): 1 [IU] via SUBCUTANEOUS

## 2022-09-13 MED ORDER — ACETAMINOPHEN 325 MG PO TABS
650.0000 mg | ORAL_TABLET | Freq: Four times a day (QID) | ORAL | Status: DC | PRN
  Administered 2022-09-14: 650 mg via ORAL
  Filled 2022-09-13: qty 2

## 2022-09-13 NOTE — Assessment & Plan Note (Addendum)
Hemodialysis schedule M/T/TH/F.  Oliguric. PD catheter with drainage. PD catheter not concerning for infection according to Vascular surgery. Will try to flush PD catheter for dialysis today; otherwise will be removed outpatient.   - Nephrology following, appreciate recs - Plan for hemodialysis today  -  monitor RFP  - avoid nephrotoxic agents

## 2022-09-13 NOTE — ED Triage Notes (Signed)
BIB EMS for drainage from abdominal incision, recent PD cath placement for transition to PD, pt reports headache and back pain

## 2022-09-13 NOTE — H&P (Signed)
Hospital Admission History and Physical Service Pager: [phone]  Patient name: Jose [name] Medical record number: [id number] Date of Birth: 1976-07-02 Age: 46 y.o. Gender: male  Primary Care Provider: Roddie Mc, NP Consultants: Nephrology Code Status: Full Preferred Emergency Contact: Healthcare power of attorney and decision maker  Courtney Paris (Spouse) [phone] (Mobile)   Chief Complaint: Hypertensive emergency  Assessment and Plan: Jose [name] is a 46 y.o. male presenting with drainage at hemodialysis site and was found to have hypertensive emergency. PMH includes hypertension, ESRD on HD M/T/TH/F (transitioning to PD), type 2 diabetes, osteomyelitis, BPH, constipation, peripheral neuropathy, macular degeneration, hyperlipidemia, GAD.   * Hypertensive emergency On admission, his systolic blood pressure ranging from the 200s-220s.  Patient reported a severe tension-like headache when he was coming to the hospital, on exam headache has resolved.  No focal deficits on physical exam.  Reports taking metoprolol and hydralazine prior to admission.  In the ED, Cardene drip was started and discontinued due to resolving BP.  Most recent blood pressure recordings 107/56. - Admit to FMTS, progressive, attending Dr. Thompson Grayer - Restart home hydralazine, metoprolol, and torsemide - Continue cardene drip - Continue to monitor closely - Cardiac monitoring - Up with assistance - Morning CBC, CMP, magnesium  QT prolongation Qtc 517 on admission. We did not restart his home trazodone or mirtazapine out of concern for worsening QT.  - avoid QT prolonging medications - daily EKG until resolved  ESRD (end stage renal disease) ([place]) Hemodialysis schedule M/T/TH/F.  Started him on the beginning of October, still able to produce urine. He recently had PD catheter placed and is in the process of transitioning to PD. Went to hemodialysis today; after unsuccessful attempt for access  with PD port, instilled TPA with plan to try again Tuesday. Patient noticed drainage coming from the PD catheter which prompted wife to call EMS.  Patient also reports shoulder pain which is an indication when he is needing dialysis. -Nephrology consulted, appreciate recs -Plan for hemodialysis during hospital stay -VTE prophylaxis: Subcutaneous heparin    Type 2 diabetes mellitus with chronic kidney disease, with long-term current use of insulin (HCC) A1c 05/2022 was 7.7.  Takes 12 units of insulin BID, uses 70/30 mix. - CBGs 4 times daily - very sensitive SSI due to ESRD  -- Restart home insulin  Shoulder pain, right Present on admission. No known recent trauma or injury. Patient reports his right shoulder throbs when he is volume overloaded and needs dialysis; says this pain today is exactly the same.  -Pain regimen: Tylenol, apply heat/ice, Voltaren gel -- Cannot use lidocaine cream as reported intolerance -- Nephro consulted for HD, see ESRD section for more   Chronic medical conditions: Constipation-continue MiraLAX Diabetic neuropathy-continue gabapentin Unknown lung dx-continue albuterol Hyperlipidemia-continue Lipitor Depression-continue Paxil GERD-continue Protonix BPH Osteomyelitis  FEN/GI: renal limited fluid VTE Prophylaxis: Cr dosed lovenox  Disposition: Progressive  History of Present Illness:  Jose [name] is a 46 y.o. male presenting with severe headache and drainage from hemodialysis site.  Went to HD today, they tried to use PD cath but it was blocked. Instilled TPA, was told they would let it sit overnight. Went home and ate dinner. Noticed drainage from the PD catheter. Patient also reports a headache that started as he was leaving home from ED. he reports never having a severe headache like this in the past.  He reports more frequent headaches past.  Patient describes headache as tension headache, feeling the pain "all  over his head".  When asked reports  neck soreness during the headache.  Denies tinnitus, flashing lights during the headache, he feels like he hears his heartbeat.  During the headache, he noticed dizziness and lightheadedness and feeling heavy describing as "something was pressing down on me".  He feels that the headache almost resolved, now that BP under control.  He reports global weakness, noticed when standing or doing things. He feels dizzy when he stands up which he notices has been happening more recently. Patient reports right shoulder aching which he feels is usually and indication that he needs dialysis.  In the ED, patient was found to have SBP ranging from 200s to 220s.  Was started on Cardene drip and was discontinued as blood pressure improved.   Review Of Systems: Per HPI with the following additions: Denies urinary incontinence or obstruction.  Pertinent Past Medical History: Hypertension, ESRD on HD M/T/TH/F, type 2 diabetes, osteomyelitis, BPH, constipation, peripheral neuropathy  Pertinent Past Surgical History: PD catheter x2 IJ cath for dialysis Trigger finger release Lumbar laminectomy/decompression microdiscectomy  Appendectomy Hernia repair  Remainder reviewed in history tab.   Pertinent Social History: Tobacco use: Former - stopped 2020 Smoked for 25-30 years previously Smoked about 1 PPD  Alcohol use: None currently, was previously social drinker  Other Substance use: None  Lives with wife and youngest daughter in [place]  Pertinent Family History: DM HTN Kidney disease Older brother has LVAD Stroke - brother, mother  Remainder reviewed in history tab.   Important Outpatient Medications: Tylenol, Xanax, Lipitor, vitamin D , Neurontin, hydralazine, Atarax, insulin 12 units, iron, Reglan, metoprolol, Remeron, oxycodone, Protonix, Paxil, MiraLAX, Flomax, torsemide, trazodone  Remainder reviewed in medication history.   Objective: BP (!) 175/84   Pulse 83   Temp 98 F (36.7  C) (Oral)   Resp 15   Ht 5\' 11"  (1.803 m)   Wt 90.7 kg   SpO2 96%   BMI 27.89 kg/m  Exam: General: Well-appearing male in bed.  No acute distress Cardiovascular: S1 murmur appreciated at tricuspid valve.  RRR Respiratory: Normal effort Gastrointestinal: Normal bowel sounds. Laparoscopic scars not draining on palpation. PD catheter on left lower quadrant of abdomen. Bandage on central lower abdomen and hospital gown with some serous drainage.  Neuro: Mental Status -  Level of arousal and orientation to time, place, and person were intact. Fund of Knowledge was assessed and was intact.   Cranial Nerves II - XII - II - Visual field intact OU. III, IV, VI - Extraocular movements intact. V - Facial sensation intact bilaterally. VII - Facial movement intact bilaterally. VIII - Hearing & vestibular intact bilaterally. X - Palate elevates symmetrically. XI - Chin turning & shoulder shrug intact bilaterally. XII - Tongue protrusion intact.   Motor Strength - The patient's strength was 5/5 RLE, LUE, and LLE.  4/5 strength in RUE due to shoulder pain.  Bulk was normal and fasciculations were absent.  Grip strength intact Motor Tone - Muscle tone was assessed at the neck and appendages and was normal.   Sensory - Light touch assessed and symmetrical.    Labs:  CBC BMET  Recent Labs  Lab 09/13/22 1639  WBC 8.0  HGB 9.2*  HCT 26.5*  PLT 208   Recent Labs  Lab 09/13/22 1639  NA 134*  K 4.2  CL 95*  CO2 28  BUN 15  CREATININE 2.30*  GLUCOSE 226*  CALCIUM 7.9*     EKG: Qtc 517, no ST  elevation, NSR  Imaging Studies Performed:  CT ABDOMEN AND PELVIS WITHOUT CONTRAST 09/13/2022 IMPRESSION: 1. Small amount of soft tissue stranding and emphysema within the subcutaneous fat surrounding the peritoneal dialysis catheter within the anterior abdominal wall. No drainable fluid collection. 2. Cholelithiasis without evidence of cholecystitis or biliary dilatation. 3. Mild  dependent atelectasis at both lung bases. 4. Aortic Atherosclerosis (ICD10-I70.0).  CT HEAD WITHOUT CONTRAST  09/13/2022 FINDINGS: Brain: No evidence of acute infarction, hemorrhage, hydrocephalus, extra-axial collection or mass lesion/mass effect. Vascular: No hyperdense vessel or unexpected calcification. Skull: Normal. Negative for fracture or focal lesion. Sinuses/Orbits: No acute finding. Other: None. IMPRESSION: No acute intracranial pathology.    Alesia Morin, MD 09/13/2022, 11:12 PM PGY-1, [place] Intern pager: [phone], text pages welcome Secure chat group [facility] Teaching Service   Upper Level Addendum: I have seen and evaluated this patient along with Dr. Vergia Alberts and reviewed the above note, making necessary revisions as appropriate. I agree with the medical decision making and physical exam as noted above. Ezequiel Essex, MD PGY-3 [facility]

## 2022-09-13 NOTE — Assessment & Plan Note (Addendum)
BP improved with intermittent episodes of hypertension, this morning BP up to high 150s systolic. Initially on nitro drip which is now off.  -continue home hydralazine tid -continue torsemide 40 mg daily -continue to monitor BP -continue cardiac telemetry -consider IV antihypertensives if systolic above 160s

## 2022-09-13 NOTE — ED Provider Notes (Signed)
[facility] EMERGENCY DEPARTMENT Provider Note   CSN: [id number] Arrival date & time: 09/13/22  1507     History  Chief Complaint  Patient presents with   Wound Check    Jose [name] is a 46 y.o. male.  HPI 52 male with history of DM type II, macular degeneration, hypertension, hyperlipidemia, GAD, ESRD, on HD but currently getting transition to PD with recent PD catheter placement on 11/15 presenting to the ER with drainage from incision site.  He states that this morning he noted that his pants were wet and thought he just got it wet from cleaning dishes.  However he then noticed he had continuous clearish fluid draining from one of the wounds.  He also complains of some abdominal pain to the area.  Denies any fevers, chills, no nausea or vomiting.  He was concerned about infection and sought evaluation in the ER.   Home Medications Prior to Admission medications   Medication Sig Start Date End Date Taking? Authorizing Provider  acetaminophen (TYLENOL) 500 MG tablet Take 500-1,000 mg by mouth every 6 (six) hours as needed for mild pain.    [provider]  albuterol (VENTOLIN HFA) 108 (90 Base) MCG/ACT inhaler Inhale 1 puff into the lungs every 4 (four) hours as needed for wheezing or shortness of breath. 05/27/21   [provider]  ALPRAZolam Duanne Moron) 0.25 MG tablet Take 0.25 mg by mouth 2 (two) times daily as needed for anxiety. 07/22/22   [provider]  atorvastatin (LIPITOR) 40 MG tablet Take 40 mg by mouth daily.    [provider]  cholecalciferol (VITAMIN D3) 25 MCG (1000 UNIT) tablet Take 1,000 Units by mouth daily.    [provider]  Continuous Blood Gluc Sensor (FREESTYLE LIBRE 3 SENSOR) MISC 1 Device by Does not apply route every 14 (fourteen) days. Apply 1 sensor on upper arm every 14 days for continuous glucose monitoring 04/29/22   Elayne Snare, MD  gabapentin (NEURONTIN) 100 MG capsule Take 2 capsules (200 mg  total) by mouth 2 (two) times daily. 09/21/21   Love, Ivan Anchors, PA-C  hydrALAZINE (APRESOLINE) 50 MG tablet Take 1 tablet (50 mg total) by mouth every 8 (eight) hours. Patient taking differently: Take 50 mg by mouth 3 (three) times daily. 06/08/22   Elgergawy, Silver Huguenin, MD  hydrOXYzine (ATARAX) 25 MG tablet TAKE 1 TABLET(25 MG) BY MOUTH THREE TIMES DAILY AS NEEDED FOR ANXIETY OR NAUSEA 09/09/22   Lovorn, Jinny Blossom, MD  insulin NPH-regular Human (70-30) 100 UNIT/ML injection Inject 8-12 Units into the skin See admin instructions. Taking 10 units in the Am and 14 units at night    [provider]  Insulin Pen Needle (PENTIPS) 32G X 4 MM MISC use as directed 09/03/22   Elayne Snare, MD  iron polysaccharides (NIFEREX) 150 MG capsule Take 1 capsule (150 mg total) by mouth daily. 07/06/22   Raiford Noble Latif, DO  metoCLOPramide (REGLAN) 5 MG tablet Take 1 tablet (5 mg total) by mouth every 8 (eight) hours as needed for nausea. 08/20/22 09/19/22  Gerrit Heck, MD  Metoprolol Tartrate 75 MG TABS Take 75 mg by mouth 2 (two) times daily. 06/08/22   Elgergawy, Silver Huguenin, MD  mirtazapine (REMERON) 15 MG tablet Take 1 tablet (15 mg total) by mouth at bedtime. 09/21/21   Love, Ivan Anchors, PA-C  oxyCODONE (ROXICODONE) 5 MG immediate release tablet Take 1 tablet (5 mg total) by mouth every 6 (six) hours as needed.  09/08/22 09/08/23  Ulyses Amor, PA-C  pantoprazole (PROTONIX) 40 MG tablet Take 1 tablet (40 mg total) by mouth daily. 07/06/22   Raiford Noble Latif, DO  PARoxetine (PAXIL) 20 MG tablet Take 1 tablet (20 mg total) by mouth at bedtime. 09/21/21   Love, Ivan Anchors, PA-C  polyethylene glycol (MIRALAX / GLYCOLAX) 17 g packet Take 17 g by mouth daily as needed for moderate constipation, mild constipation or severe constipation. 08/29/21   Terrilee Croak, MD  tamsulosin (FLOMAX) 0.4 MG CAPS capsule Take 1 capsule (0.4 mg total) by mouth daily after supper. 09/21/21   Love, Ivan Anchors, PA-C  torsemide 40 MG TABS  Take 40 mg by mouth daily. 07/06/22   Raiford Noble Latif, DO  traZODone (DESYREL) 100 MG tablet Take 100 mg by mouth at bedtime as needed for sleep.    [provider]      Allergies    Cranberry, Hm lidocaine patch [lidocaine], Melatonin, Nsaids, and Trulicity [dulaglutide]    Review of Systems   Review of Systems Ten systems reviewed and are negative for acute change, except as noted in the HPI.   Physical Exam Updated Vital Signs BP 119/61   Pulse 79   Temp 97.7 F (36.5 C) (Oral)   Resp 16   Ht 5\' 11"  (1.803 m)   Wt 90.7 kg   SpO2 96%   BMI 27.89 kg/m  Physical Exam Vitals and nursing note reviewed.  Constitutional:      General: He is not in acute distress.    Appearance: He is well-developed.  HENT:     Head: Normocephalic and atraumatic.  Eyes:     Conjunctiva/sclera: Conjunctivae normal.  Cardiovascular:     Rate and Rhythm: Normal rate and regular rhythm.     Heart sounds: No murmur heard. Pulmonary:     Effort: Pulmonary effort is normal. No respiratory distress.     Breath sounds: Normal breath sounds.  Abdominal:     Palpations: Abdomen is soft.     Tenderness: There is abdominal tenderness.  Musculoskeletal:        General: No swelling.     Cervical back: Neck supple.  Skin:    General: Skin is warm and dry.     Capillary Refill: Capillary refill takes less than 2 seconds.     Findings: Erythema present.  Neurological:     Mental Status: He is alert.  Psychiatric:        Mood and Affect: Mood normal.     ED Results / Procedures / Treatments   Labs (all labs ordered are listed, but only abnormal results are displayed) Labs Reviewed  CBC WITH DIFFERENTIAL/PLATELET - Abnormal; Notable for the following components:      Result Value   RBC 3.06 (*)    Hemoglobin 9.2 (*)    HCT 26.5 (*)    All other components within normal limits  COMPREHENSIVE METABOLIC PANEL - Abnormal; Notable for the following components:   Sodium 134 (*)     Chloride 95 (*)    Glucose, Bld 226 (*)    Creatinine, Ser 2.30 (*)    Calcium 7.9 (*)    Total Protein 6.3 (*)    Albumin 3.3 (*)    Alkaline Phosphatase 155 (*)    GFR, Estimated 34 (*)    All other components within normal limits    EKG None  Radiology CT Head Wo Contrast  Result Date: 09/13/2022 CLINICAL DATA:  Headache with hypertension EXAM:  CT HEAD WITHOUT CONTRAST TECHNIQUE: Contiguous axial images were obtained from the base of the skull through the vertex without intravenous contrast. RADIATION DOSE REDUCTION: This exam was performed according to the departmental dose-optimization program which includes automated exposure control, adjustment of the mA and/or kV according to patient size and/or use of iterative reconstruction technique. COMPARISON:  CT head 06/05/2022 FINDINGS: Brain: No evidence of acute infarction, hemorrhage, hydrocephalus, extra-axial collection or mass lesion/mass effect. Vascular: No hyperdense vessel or unexpected calcification. Skull: Normal. Negative for fracture or focal lesion. Sinuses/Orbits: No acute finding. Other: None. IMPRESSION: No acute intracranial pathology. Electronically Signed   By: Ronney Asters M.D.   On: 09/13/2022 20:30   CT ABDOMEN PELVIS WO CONTRAST  Result Date: 09/13/2022 CLINICAL DATA:  Postop diagnostic laparoscopy and revision of peritoneal dialysis catheter 09/08/2022. Drainage from abdominal incision. Headache and back pain. EXAM: CT ABDOMEN AND PELVIS WITHOUT CONTRAST TECHNIQUE: Multidetector CT imaging of the abdomen and pelvis was performed following the standard protocol without IV contrast. RADIATION DOSE REDUCTION: This exam was performed according to the departmental dose-optimization program which includes automated exposure control, adjustment of the mA and/or kV according to patient size and/or use of iterative reconstruction technique. COMPARISON:  Abdominopelvic CT 09/01/2022. FINDINGS: Lower chest: Hemodialysis  catheter extends into the inferior aspect of the right atrium, similar to previous CT. Mild dependent atelectasis at both lung bases. No significant pleural or pericardial effusion. Coronary artery calcifications are noted. Hepatobiliary: No focal hepatic abnormalities are identified on noncontrast imaging. There are small dependent calcified gallstones. No evidence of gallbladder wall thickening, surrounding inflammation or biliary dilatation. Pancreas: Unremarkable. No pancreatic ductal dilatation or surrounding inflammatory changes. Spleen: Normal in size without focal abnormality. Adrenals/Urinary Tract: Both adrenal glands appear normal. No evidence of urinary tract calculus, suspicious renal lesion or hydronephrosis. The bladder appears unremarkable for its degree of distention. Stomach/Bowel: No enteric contrast administered. The stomach appears unremarkable for its degree of distension. No evidence of bowel wall thickening, distention or surrounding inflammatory change. Previous appendectomy. Vascular/Lymphatic: There are no enlarged abdominal or pelvic lymph nodes. Mild aortoiliac atherosclerosis without evidence of aneurysm. Reproductive: The prostate gland and seminal vesicles appear unremarkable. Other: Peritoneal dialysis catheter is coiled between the bladder and sigmoid colon. There is a small amount of soft tissue stranding and emphysema within the subcutaneous fat surrounding the catheter within the anterior abdominal wall. No drainable fluid collection. Trace peritoneal fluid without focal loculation. Minimal pneumoperitoneum attributed to the recent surgery and/or peritoneal catheter. Musculoskeletal: No acute or significant osseous findings. IMPRESSION: 1. Small amount of soft tissue stranding and emphysema within the subcutaneous fat surrounding the peritoneal dialysis catheter within the anterior abdominal wall. No drainable fluid collection. 2. Cholelithiasis without evidence of cholecystitis  or biliary dilatation. 3. Mild dependent atelectasis at both lung bases. 4. Aortic Atherosclerosis (ICD10-I70.0). Electronically Signed   By: Richardean Sale M.D.   On: 09/13/2022 18:37    Procedures Procedures    Medications Ordered in ED Medications  nicardipine (CARDENE) 20mg  in 0.86% saline 263ml IV infusion (0.1 mg/ml) (3 mg/hr Intravenous Rate/Dose Change 09/13/22 2104)  fentaNYL (SUBLIMAZE) injection 50 mcg (50 mcg Intravenous Given 09/13/22 1704)  labetalol (NORMODYNE) injection 5 mg (5 mg Intravenous Given 09/13/22 1735)  labetalol (NORMODYNE) injection 10 mg (10 mg Intravenous Given 09/13/22 1844)  prochlorperazine (COMPAZINE) injection 10 mg (10 mg Intravenous Given 09/13/22 1842)  diphenhydrAMINE (BENADRYL) injection 25 mg (25 mg Intravenous Given 09/13/22 1841)  hydrALAZINE (APRESOLINE) injection 20 mg (20 mg Intravenous  Given 09/13/22 1947)    ED Course/ Medical Decision Making/ A&P                           Medical Decision Making Amount and/or Complexity of Data Reviewed Labs: ordered. Radiology: ordered.  Risk Prescription drug management. Decision regarding hospitalization.  46 year old male presenting with concerns for drainage from incision site from recent PD catheter.  Actual PD catheter site does not appear infected, no erythema, fluctuation or warmth.  Noted some serosanguineous drainage to the abdominal incision inferior to the umbilicus.  Some tenderness on palpation.  Labs ordered, reviewed, CBC without leukocytosis, no fevers.  His CMP shows a improved creatinine from prior.  Ordered CT of the abdomen pelvis, reviewed, agree with radiology read, showed a little bit of stranding and subcutaneous gas near the site of the PD catheter, however I suspect this is secondary to recent operative course.  Patient arrived with a blood pressure in the 180s, however progressively became more hypertensive throughout his ED course with a systolic as high as 194.  He also  complained of a severe headache.  No chest pain.  I ordered a repeat CT of his head to rule out intracranial bleed, reviewed, agree with radiology read, negative for acute findings.  He was given a migraine cocktail, 15 mg of IV labetalol, 20 mg of hydralazine with no improvement.  Cardene drip initiated in the setting of hypertensive urgency/emergency. Consulted family medicine service who will admit the patient for further evaluation and treatment  Final Clinical Impression(s) / ED Diagnoses Final diagnoses:  Hypertensive urgency    Rx / DC Orders ED Discharge Orders     None         Lyndel Safe 09/13/22 2113    Jeanell Sparrow, DO 09/14/22 2349

## 2022-09-13 NOTE — Assessment & Plan Note (Addendum)
A1c 05/2022 was 7.7.  Takes 12 units of insulin, uses 70/30 mix. - CBGs - SSI -- Restart home insulin

## 2022-09-14 ENCOUNTER — Encounter (HOSPITAL_COMMUNITY): Payer: Self-pay | Admitting: Psychiatry

## 2022-09-14 DIAGNOSIS — Y636 Underdosing and nonadministration of necessary drug, medicament or biological substance: Secondary | ICD-10-CM | POA: Diagnosis present

## 2022-09-14 DIAGNOSIS — I120 Hypertensive chronic kidney disease with stage 5 chronic kidney disease or end stage renal disease: Secondary | ICD-10-CM | POA: Diagnosis present

## 2022-09-14 DIAGNOSIS — J9811 Atelectasis: Secondary | ICD-10-CM | POA: Diagnosis present

## 2022-09-14 DIAGNOSIS — T85631A Leakage of intraperitoneal dialysis catheter, initial encounter: Secondary | ICD-10-CM | POA: Diagnosis present

## 2022-09-14 DIAGNOSIS — M25511 Pain in right shoulder: Secondary | ICD-10-CM | POA: Diagnosis present

## 2022-09-14 DIAGNOSIS — E785 Hyperlipidemia, unspecified: Secondary | ICD-10-CM | POA: Diagnosis present

## 2022-09-14 DIAGNOSIS — I700 Atherosclerosis of aorta: Secondary | ICD-10-CM | POA: Diagnosis present

## 2022-09-14 DIAGNOSIS — N2581 Secondary hyperparathyroidism of renal origin: Secondary | ICD-10-CM | POA: Diagnosis present

## 2022-09-14 DIAGNOSIS — N186 End stage renal disease: Secondary | ICD-10-CM | POA: Diagnosis present

## 2022-09-14 DIAGNOSIS — E11319 Type 2 diabetes mellitus with unspecified diabetic retinopathy without macular edema: Secondary | ICD-10-CM | POA: Diagnosis present

## 2022-09-14 DIAGNOSIS — Z79899 Other long term (current) drug therapy: Secondary | ICD-10-CM | POA: Diagnosis not present

## 2022-09-14 DIAGNOSIS — Z794 Long term (current) use of insulin: Secondary | ICD-10-CM | POA: Diagnosis not present

## 2022-09-14 DIAGNOSIS — Y812 Prosthetic and other implants, materials and accessory general- and plastic-surgery devices associated with adverse incidents: Secondary | ICD-10-CM | POA: Diagnosis present

## 2022-09-14 DIAGNOSIS — E1122 Type 2 diabetes mellitus with diabetic chronic kidney disease: Secondary | ICD-10-CM | POA: Diagnosis present

## 2022-09-14 DIAGNOSIS — R9431 Abnormal electrocardiogram [ECG] [EKG]: Secondary | ICD-10-CM | POA: Diagnosis present

## 2022-09-14 DIAGNOSIS — E876 Hypokalemia: Secondary | ICD-10-CM | POA: Diagnosis present

## 2022-09-14 DIAGNOSIS — R112 Nausea with vomiting, unspecified: Secondary | ICD-10-CM | POA: Diagnosis present

## 2022-09-14 DIAGNOSIS — I161 Hypertensive emergency: Secondary | ICD-10-CM | POA: Diagnosis present

## 2022-09-14 DIAGNOSIS — F32A Depression, unspecified: Secondary | ICD-10-CM | POA: Diagnosis present

## 2022-09-14 DIAGNOSIS — I160 Hypertensive urgency: Secondary | ICD-10-CM | POA: Diagnosis not present

## 2022-09-14 DIAGNOSIS — E8770 Fluid overload, unspecified: Secondary | ICD-10-CM | POA: Diagnosis present

## 2022-09-14 DIAGNOSIS — T85611A Breakdown (mechanical) of intraperitoneal dialysis catheter, initial encounter: Secondary | ICD-10-CM | POA: Diagnosis present

## 2022-09-14 DIAGNOSIS — D72829 Elevated white blood cell count, unspecified: Secondary | ICD-10-CM | POA: Diagnosis present

## 2022-09-14 DIAGNOSIS — Z992 Dependence on renal dialysis: Secondary | ICD-10-CM | POA: Diagnosis not present

## 2022-09-14 DIAGNOSIS — E1165 Type 2 diabetes mellitus with hyperglycemia: Secondary | ICD-10-CM | POA: Diagnosis present

## 2022-09-14 DIAGNOSIS — D631 Anemia in chronic kidney disease: Secondary | ICD-10-CM | POA: Diagnosis present

## 2022-09-14 DIAGNOSIS — Z87891 Personal history of nicotine dependence: Secondary | ICD-10-CM | POA: Diagnosis not present

## 2022-09-14 DIAGNOSIS — E1143 Type 2 diabetes mellitus with diabetic autonomic (poly)neuropathy: Secondary | ICD-10-CM | POA: Diagnosis present

## 2022-09-14 DIAGNOSIS — I10 Essential (primary) hypertension: Secondary | ICD-10-CM | POA: Diagnosis not present

## 2022-09-14 LAB — COMPREHENSIVE METABOLIC PANEL
ALT: 13 U/L (ref 0–44)
AST: 20 U/L (ref 15–41)
Albumin: 3.1 g/dL — ABNORMAL LOW (ref 3.5–5.0)
Alkaline Phosphatase: 157 U/L — ABNORMAL HIGH (ref 38–126)
Anion gap: 11 (ref 5–15)
BUN: 17 mg/dL (ref 6–20)
CO2: 28 mmol/L (ref 22–32)
Calcium: 8.1 mg/dL — ABNORMAL LOW (ref 8.9–10.3)
Chloride: 96 mmol/L — ABNORMAL LOW (ref 98–111)
Creatinine, Ser: 2.67 mg/dL — ABNORMAL HIGH (ref 0.61–1.24)
GFR, Estimated: 29 mL/min — ABNORMAL LOW (ref >60)
Glucose, Bld: 196 mg/dL — ABNORMAL HIGH (ref 70–99)
Potassium: 3.8 mmol/L (ref 3.5–5.1)
Sodium: 135 mmol/L (ref 135–145)
Total Bilirubin: 0.7 mg/dL (ref 0.3–1.2)
Total Protein: 6.1 g/dL — ABNORMAL LOW (ref 6.5–8.1)

## 2022-09-14 LAB — CBC
HCT: 26 % — ABNORMAL LOW (ref 39.0–52.0)
Hemoglobin: 9.2 g/dL — ABNORMAL LOW (ref 13.0–17.0)
MCH: 30.5 pg (ref 26.0–34.0)
MCHC: 35.4 g/dL (ref 30.0–36.0)
MCV: 86.1 fL (ref 80.0–100.0)
Platelets: 216 10*3/uL (ref 150–400)
RBC: 3.02 MIL/uL — ABNORMAL LOW (ref 4.22–5.81)
RDW: 13.2 % (ref 11.5–15.5)
WBC: 8.3 10*3/uL (ref 4.0–10.5)
nRBC: 0 % (ref 0.0–0.2)

## 2022-09-14 LAB — CBG MONITORING, ED
Glucose-Capillary: 245 mg/dL — ABNORMAL HIGH (ref 70–99)
Glucose-Capillary: 244 mg/dL — ABNORMAL HIGH (ref 70–99)
Glucose-Capillary: 194 mg/dL — ABNORMAL HIGH (ref 70–99)

## 2022-09-14 LAB — MAGNESIUM: Magnesium: 1.7 mg/dL (ref 1.7–2.4)

## 2022-09-14 LAB — HEPATITIS B SURFACE ANTIGEN: Hepatitis B Surface Ag: NONREACTIVE

## 2022-09-14 MED ORDER — CHLORHEXIDINE GLUCONATE CLOTH 2 % EX PADS
6.0000 | MEDICATED_PAD | Freq: Every day | CUTANEOUS | Status: DC
  Administered 2022-09-15 – 2022-09-17 (×3): 6 via TOPICAL

## 2022-09-14 MED ORDER — METOCLOPRAMIDE HCL 5 MG/5ML PO SOLN: 5.0000 mg | Freq: Three times a day (TID) | ORAL | Status: DC | PRN

## 2022-09-14 MED ORDER — METOCLOPRAMIDE HCL 10 MG PO TABS: 5.0000 mg | ORAL_TABLET | Freq: Three times a day (TID) | ORAL | Status: DC | PRN

## 2022-09-14 MED ORDER — PROCHLORPERAZINE EDISYLATE 10 MG/2ML IJ SOLN
5.0000 mg | Freq: Four times a day (QID) | INTRAMUSCULAR | Status: DC | PRN
  Administered 2022-09-14 – 2022-09-16 (×5): 5 mg via INTRAVENOUS
  Filled 2022-09-14 (×2): qty 2
  Filled 2022-09-14: qty 1
  Filled 2022-09-14 (×2): qty 2

## 2022-09-14 MED ORDER — INSULIN NPH (HUMAN) (ISOPHANE) 100 UNIT/ML ~~LOC~~ SUSP: 12.0000 [IU] | Freq: Two times a day (BID) | SUBCUTANEOUS | Status: DC

## 2022-09-14 MED ORDER — INSULIN NPH (HUMAN) (ISOPHANE) 100 UNIT/ML ~~LOC~~ SUSP
8.0000 [IU] | Freq: Two times a day (BID) | SUBCUTANEOUS | Status: DC
  Administered 2022-09-14 – 2022-09-17 (×7): 8 [IU] via SUBCUTANEOUS
  Filled 2022-09-14 (×2): qty 10

## 2022-09-14 MED ORDER — METOCLOPRAMIDE HCL 5 MG/5ML PO SOLN: 5.0000 mg | Freq: Two times a day (BID) | ORAL | Status: DC | PRN

## 2022-09-14 MED ORDER — TORSEMIDE 20 MG PO TABS
40.0000 mg | ORAL_TABLET | Freq: Every day | ORAL | Status: DC
  Administered 2022-09-15 – 2022-09-17 (×3): 40 mg via ORAL
  Filled 2022-09-14 (×4): qty 2

## 2022-09-14 MED ORDER — NITROGLYCERIN IN D5W 200-5 MCG/ML-% IV SOLN
0.0000 ug/min | INTRAVENOUS | Status: DC
  Administered 2022-09-14 (×2): 5 ug/min via INTRAVENOUS
  Filled 2022-09-14 (×2): qty 250

## 2022-09-14 MED ORDER — HEPARIN SODIUM (PORCINE) 1000 UNIT/ML IJ SOLN
INTRAMUSCULAR | Status: AC
  Filled 2022-09-14: qty 3

## 2022-09-14 MED ORDER — METOCLOPRAMIDE HCL 5 MG PO TABS
5.0000 mg | ORAL_TABLET | Freq: Three times a day (TID) | ORAL | Status: DC
  Administered 2022-09-14: 5 mg via ORAL
  Filled 2022-09-14: qty 0.5

## 2022-09-14 MED ORDER — METOCLOPRAMIDE HCL 5 MG/ML IJ SOLN
5.0000 mg | Freq: Three times a day (TID) | INTRAMUSCULAR | Status: DC | PRN
  Administered 2022-09-14 – 2022-09-15 (×2): 5 mg via INTRAVENOUS
  Filled 2022-09-14 (×2): qty 2

## 2022-09-14 NOTE — ED Notes (Signed)
Dr. Boyina at bedside 

## 2022-09-14 NOTE — Plan of Care (Signed)

## 2022-09-14 NOTE — Evaluation (Addendum)
Physical Therapy Evaluation Patient Details Name: Jose [name] MRN: [id number] DOB: 02/20/1976 Today's Date: 09/14/2022  History of Present Illness  Pt is a 46 y.o. M who presents 09/13/2022 with drainage at hemodialysis site and found to have HTN emergency. PMH includes hypertension, ESRD on HD M/T/TH/F (transitioning to PD), type 2 diabetes, osteomyelitis, BPH, constipation, peripheral neuropathy, macular degeneration, hyperlipidemia, GAD.  Clinical Impression  PTA, pt lives with his family and is independent with mobility using a cane. Pt reports mild lightheadedness and dizziness, but improved headache. Pt ambulating 150 ft with a cane at a supervision level. Demonstrates mild dynamic balance deficits and decreased gait speed. Pt does appear to be orthostatic; BP sitting 155/84 (104) pre mobility; 117/81 (92) post mobility, HR 77, SpO2 94% on RA (RN notified). Encouraged pt to seek referral from PCP for OPPT for further balance training; pt would like to hold off on this until he develops more of a routine with his HD schedule. No further acute PT needs.      Recommendations for follow up therapy are one component of a multi-disciplinary discharge planning process, led by the attending physician.  Recommendations may be updated based on patient status, additional functional criteria and insurance authorization.  Follow Up Recommendations No PT follow up      Assistance Recommended at Discharge PRN  Patient can return home with the following  Assist for transportation;Help with stairs or ramp for entrance    Equipment Recommendations None recommended by PT  Recommendations for Other Services       Functional Status Assessment Patient has had a recent decline in their functional status and demonstrates the ability to make significant improvements in function in a reasonable and predictable amount of time.     Precautions / Restrictions Precautions Precautions: Fall;Other  (comment) Precaution Comments: orthostatic hypotension Restrictions Weight Bearing Restrictions: No      Mobility  Bed Mobility Overal bed mobility: Modified Independent                  Transfers Overall transfer level: Modified independent                      Ambulation/Gait Ambulation/Gait assistance: Supervision Gait Distance (Feet): 150 Feet Assistive device: Straight cane Gait Pattern/deviations: Step-through pattern, Decreased stride length Gait velocity: decreased     General Gait Details: Overall supervision for line management, mild dynamic instability, but no overt LOB.  Stairs            Wheelchair Mobility    Modified Rankin (Stroke Patients Only)       Balance Overall balance assessment: Mild deficits observed, not formally tested                                           Pertinent Vitals/Pain Pain Assessment Pain Assessment: Faces Faces Pain Scale: Hurts a little bit Pain Location: R shoulder with passive and active motion Pain Descriptors / Indicators: Aching Pain Intervention(s): Monitored during session    Home Living Family/patient expects to be discharged to:: Private residence Living Arrangements: Spouse/significant other;Children Available Help at Discharge: Family Type of Home: House Home Access: Stairs to enter Entrance Stairs-Rails: None Entrance Stairs-Number of Steps: 3   Home Layout: One level Home Equipment: Cane - single point;Shower Land (2 wheels) Additional Comments: pt reports frequent falls, but does not use his RW,  uses cane    Prior Function Prior Level of Function : Independent/Modified Independent;Driving             Mobility Comments: cane for mobility ADLs Comments: on short term disability     Hand Dominance   Dominant Hand: Right    Extremity/Trunk Assessment   Upper Extremity Assessment Upper Extremity Assessment: RUE deficits/detail RUE  Deficits / Details: Pain with right shoulder AROM and PROM at ~90 degrees    Lower Extremity Assessment Lower Extremity Assessment: RLE deficits/detail;LLE deficits/detail RLE Deficits / Details: Strength 5/5 LLE Deficits / Details: Strength 5/5    Cervical / Trunk Assessment Cervical / Trunk Assessment: Normal  Communication   Communication: No difficulties  Cognition Arousal/Alertness: Awake/alert Behavior During Therapy: WFL for tasks assessed/performed Overall Cognitive Status: Within Functional Limits for tasks assessed                                          General Comments      Exercises     Assessment/Plan    PT Assessment Patient does not need any further PT services  PT Problem List         PT Treatment Interventions      PT Goals (Current goals can be found in the Care Plan section)  Acute Rehab PT Goals Patient Stated Goal: improve balance PT Goal Formulation: All assessment and education complete, DC therapy    Frequency       Co-evaluation               AM-PAC PT "6 Clicks" Mobility  Outcome Measure Help needed turning from your back to your side while in a flat bed without using bedrails?: None Help needed moving from lying on your back to sitting on the side of a flat bed without using bedrails?: None Help needed moving to and from a bed to a chair (including a wheelchair)?: None Help needed standing up from a chair using your arms (e.g., wheelchair or bedside chair)?: None Help needed to walk in hospital room?: A Little Help needed climbing 3-5 steps with a railing? : A Little 6 Click Score: 22    End of Session   Activity Tolerance: Patient tolerated treatment well Patient left: in bed;with call bell/phone within reach Nurse Communication: Mobility status PT Visit Diagnosis: Unsteadiness on feet (R26.81)    Time: 1610-9604 PT Time Calculation (min) (ACUTE ONLY): 19 min   Charges:   PT Evaluation $PT Eval  Low Complexity: [place], PT, DPT Acute Rehabilitation Services Office [phone]   Deno Etienne 09/14/2022, 2:50 PM

## 2022-09-14 NOTE — Consult Note (Signed)
ASSESSMENT & PLAN   NONFUNCTIONING PERITONEAL DIALYSIS CATHETER: This patient underwent catheter placement on 08/25/2022.  He required extensive lysis of adhesions.  The catheter required revision in which Dr. Stanford Breed replaced the intra-abdominal component with a shorter segment to try to prevent this problem.  Currently he does have a functioning hemodialysis catheter and can continue his normal hemodialysis schedule.  I will discuss with my partners to manage the peritoneal dialysis catheters.  It could potentially be repositioned again.  For now however there is no signs of infection and nothing needs to be done acutely.  REASON FOR CONSULT:    PD catheter not flushing.  The consult is requested by the emergency department.  HPI:   Jose [name] is a 46 y.o. male who was seen in consultation on 08/11/2022 for placement of a peritoneal dialysis catheter.  He is currently on hemodialysis and has a right IJ tunneled dialysis catheter.  He dialyzes Mondays Tuesdays Thursdays and Fridays.  He previously had an appendectomy and hernia surgery many years ago.  On 08/25/2022 he underwent laparoscopic lysis of adhesions and laparoscopic placement of a peritoneal dialysis catheter by Dr. Stanford Breed.  The catheter subsequently was not functioning and he was taken back to the operating room on 09/08/2022 where he underwent diagnostic laparoscopy and revision of the peritoneal dialysis catheter.  It was noted on the preoperative CT scan that the catheter had flipped into the right lower quadrant where he had had lysis of adhesions.  The previous intra-abdominal portion of the catheter was excised and a new intra-abdominal portion was placed that was shorter to keep it from flipping into the area of concern.  At the completion the catheter was working well.  Of note his past medical history significant for a history of osteomyelitis of the spine.  He was in the hospital for several months for this.  He  reportedly has some leakage around the catheter.  On my questioning he states it was not actually the catheter exit site but one of the incisions that was used for the port.  He admits to some nausea and vomiting which started yesterday.  He states that they flush the catheter last Friday and it worked fine.  When they flushed it yesterday however it was not flushing.  They placed tPA and tried again today and it was not flushing.  Past Medical History:  Diagnosis Date   AKI (acute kidney injury) ([place])    Anemia    Family history of adverse reaction to anesthesia    daughter age 49 months , coded after recieving 1 injection  for surgery - an inhalation med and morphine   GAD (generalized anxiety disorder)    Headache    Hyperlipidemia    Hypertension    Macular degeneration, bilateral    Pneumonia    Retinopathy    Type II diabetes mellitus with complication, uncontrolled    retinopathy, neuropathy, microalbuminuria    Family History  Problem Relation Age of Onset   Diabetes Mother    Hyperlipidemia Mother    Stroke Mother    Diabetes Father    Hyperlipidemia Brother    Stroke Brother    ADD / ADHD Brother    ADD / ADHD Son     SOCIAL HISTORY: Social History   Tobacco Use   Smoking status: Former    Packs/day: 1.00    Years: 20.00    Total pack years: 20.00    Types: Cigarettes  Quit date: 07/2020    Years since quitting: 2.1   Smokeless tobacco: Never  Substance Use Topics   Alcohol use: Not Currently    Allergies  Allergen Reactions   Cranberry Itching   Hm Lidocaine Patch [Lidocaine] Dermatitis    Blisters skin    Melatonin Other (See Comments)    nightmares   Nsaids Other (See Comments)    Stage 3 kidney disease   Trulicity [Dulaglutide] Other (See Comments)    Night sweats  Uncontrolled tempeture     Current Facility-Administered Medications  Medication Dose Route Frequency Provider Last Rate Last Admin   acetaminophen (TYLENOL) tablet 650 mg  650  mg Oral Q6H PRN Coralyn Pear B, MD       albuterol (PROVENTIL) (2.5 MG/3ML) 0.083% nebulizer solution 3 mL  3 mL Inhalation Q4H PRN Alesia Morin, MD       atorvastatin (LIPITOR) tablet 40 mg  40 mg Oral Daily Coralyn Pear B, MD   40 mg at 09/14/22 1019   Chlorhexidine Gluconate Cloth 2 % PADS 6 each  6 each Topical Q0600 Loren Racer, PA-C       diclofenac Sodium (VOLTAREN) 1 % topical gel 2 g  2 g Topical TID AC & HS Coralyn Pear B, MD   2 g at 09/14/22 1021   gabapentin (NEURONTIN) capsule 200 mg  200 mg Oral BID Coralyn Pear B, MD   200 mg at 09/14/22 1019   heparin injection 5,000 Units  5,000 Units Subcutaneous Q8H Coralyn Pear B, MD   5,000 Units at 09/13/22 2307   hydrALAZINE (APRESOLINE) tablet 50 mg  50 mg Oral Q8H Lowry Ram, MD   50 mg at 09/13/22 2307   insulin aspart (novoLOG) injection 0-6 Units  0-6 Units Subcutaneous TID WC Coralyn Pear B, MD   2 Units at 09/14/22 1032   insulin NPH Human (NOVOLIN N) injection 8 Units  8 Units Subcutaneous BID WC Ganta, Anupa, DO   8 Units at 09/14/22 1337   metoCLOPramide (REGLAN) tablet 5 mg  5 mg Oral TID AC Martyn Malay, MD       metoprolol tartrate (LOPRESSOR) tablet 75 mg  75 mg Oral BID Lowry Ram, MD   75 mg at 09/14/22 1019   nitroGLYCERIN 50 mg in dextrose 5 % 250 mL (0.2 mg/mL) infusion  0-200 mcg/min Intravenous Titrated Ezequiel Essex, MD   Stopped at 09/14/22 1350   pantoprazole (PROTONIX) EC tablet 40 mg  40 mg Oral Daily Coralyn Pear B, MD       polyethylene glycol (MIRALAX / GLYCOLAX) packet 17 g  17 g Oral Daily PRN Alesia Morin, MD       torsemide (DEMADEX) tablet 40 mg  40 mg Oral Daily Arlyce Dice, MD       Current Outpatient Medications  Medication Sig Dispense Refill   acetaminophen (TYLENOL) 500 MG tablet Take 500-1,000 mg by mouth every 6 (six) hours as needed for mild pain.     albuterol (VENTOLIN HFA) 108 (90 Base) MCG/ACT inhaler Inhale 1 puff into the lungs every 4 (four) hours  as needed for wheezing or shortness of breath.     ALPRAZolam (XANAX) 0.25 MG tablet Take 0.25 mg by mouth 2 (two) times daily as needed for anxiety.     atorvastatin (LIPITOR) 40 MG tablet Take 40 mg by mouth daily.     cholecalciferol (VITAMIN D3) 25 MCG (1000 UNIT) tablet Take 1,000 Units by mouth daily.  gabapentin (NEURONTIN) 100 MG capsule Take 2 capsules (200 mg total) by mouth 2 (two) times daily. 120 capsule 0   hydrALAZINE (APRESOLINE) 50 MG tablet Take 1 tablet (50 mg total) by mouth every 8 (eight) hours. (Patient taking differently: Take 50 mg by mouth 3 (three) times daily.) 150 tablet 0   hydrOXYzine (ATARAX) 25 MG tablet TAKE 1 TABLET(25 MG) BY MOUTH THREE TIMES DAILY AS NEEDED FOR ANXIETY OR NAUSEA (Patient taking differently: Take 25 mg by mouth 3 (three) times daily as needed for anxiety or nausea.) 90 tablet 1   insulin NPH-regular Human (70-30) 100 UNIT/ML injection Inject 8-12 Units into the skin See admin instructions. Taking 10 units in the Am and 14 units at night     iron polysaccharides (NIFEREX) 150 MG capsule Take 1 capsule (150 mg total) by mouth daily. 30 capsule 0   metoCLOPramide (REGLAN) 5 MG tablet Take 1 tablet (5 mg total) by mouth every 8 (eight) hours as needed for nausea. 90 tablet 1   Metoprolol Tartrate 75 MG TABS Take 75 mg by mouth 2 (two) times daily. 60 tablet 0   mirtazapine (REMERON) 15 MG tablet Take 1 tablet (15 mg total) by mouth at bedtime. 30 tablet 0   oxyCODONE (ROXICODONE) 5 MG immediate release tablet Take 1 tablet (5 mg total) by mouth every 6 (six) hours as needed. 20 tablet 0   pantoprazole (PROTONIX) 40 MG tablet Take 1 tablet (40 mg total) by mouth daily. 30 tablet 0   PARoxetine (PAXIL) 20 MG tablet Take 1 tablet (20 mg total) by mouth at bedtime. 30 tablet 0   polyethylene glycol (MIRALAX / GLYCOLAX) 17 g packet Take 17 g by mouth daily as needed for moderate constipation, mild constipation or severe constipation. 14 each 0    sevelamer carbonate (RENVELA) 800 MG tablet Take 800 mg by mouth 3 (three) times daily with meals.     tamsulosin (FLOMAX) 0.4 MG CAPS capsule Take 1 capsule (0.4 mg total) by mouth daily after supper. 30 capsule 0   torsemide 40 MG TABS Take 40 mg by mouth daily. 30 tablet 0   traZODone (DESYREL) 100 MG tablet Take 100 mg by mouth at bedtime as needed for sleep.     Continuous Blood Gluc Sensor (FREESTYLE LIBRE 3 SENSOR) MISC 1 Device by Does not apply route every 14 (fourteen) days. Apply 1 sensor on upper arm every 14 days for continuous glucose monitoring 2 each 2   Insulin Pen Needle (PENTIPS) 32G X 4 MM MISC use as directed 100 each 1    REVIEW OF SYSTEMS:  [X]  denotes positive finding, [ ]  denotes negative finding Cardiac  Comments:  Chest pain or chest pressure:    Shortness of breath upon exertion:    Short of breath when lying flat:    Irregular heart rhythm:        Vascular    Pain in calf, thigh, or hip brought on by ambulation:    Pain in feet at night that wakes you up from your sleep:     Blood clot in your veins:    Leg swelling:         Pulmonary    Oxygen at home:    Productive cough:     Wheezing:         Neurologic    Sudden weakness in arms or legs:     Sudden numbness in arms or legs:     Sudden onset of difficulty  speaking or slurred speech:    Temporary loss of vision in one eye:     Problems with dizziness:         Gastrointestinal    Blood in stool:     Vomited blood:         Genitourinary    Burning when urinating:     Blood in urine:        Psychiatric    Major depression:         Hematologic    Bleeding problems:    Problems with blood clotting too easily:        Skin    Rashes or ulcers:        Constitutional    Fever or chills:    -  PHYSICAL EXAM:   Vitals:   09/14/22 1227 09/14/22 1230 09/14/22 1245 09/14/22 1350  BP:  (!) 186/124 97/62 (!) 155/89  Pulse:  84 73 72  Resp:   19 18  Temp: (!) 97.2 F (36.2 C)     TempSrc:  Oral     SpO2:  95% 96% 96%  Weight:      Height:       Body mass index is 27.89 kg/m. GENERAL: The patient is a well-nourished male, in no acute distress. The vital signs are documented above. CARDIAC: There is a regular rate and rhythm.  VASCULAR: He has palpable femoral pulses. Both feet are warm and well-perfused. PULMONARY: There is good air exchange bilaterally without wheezing or rales. ABDOMEN: There is no leakage around the peritoneal dialysis catheter.  The small incision to the left of the umbilicus is slightly swollen but not erythematous to suggest infection.  Currently there is no leakage from these incisions. MUSCULOSKELETAL: There are no major deformities. NEUROLOGIC: No focal weakness or paresthesias are detected. SKIN: There are no ulcers or rashes noted. PSYCHIATRIC: The patient has a normal affect.  DATA:    CT ABDOMEN PELVIS: I reviewed the CT abdomen pelvis that was done yesterday.  The catheter appears to be sitting in the pelvis behind the bladder.  Deitra Mayo [facility]

## 2022-09-14 NOTE — ED Notes (Signed)
Messaged MD to stop nitro drip order to stop drip complete.  HD RN notified and reprot called

## 2022-09-14 NOTE — Progress Notes (Signed)
   09/14/22 1800  Vitals  Temp 98 F (36.7 C)  Pulse Rate 75  Resp 20  BP (!) 174/93  SpO2 96 %  O2 Device Room Air  Oxygen Therapy  Patient Activity (if Appropriate) In bed  Pulse Oximetry Type Continuous  During Treatment Monitoring  Blood Flow Rate (mL/min) 400 mL/min  Arterial Pressure (mmHg) -180 mmHg  Venous Pressure (mmHg) 200 mmHg  TMP (mmHg) 6 mmHg  Ultrafiltration Rate (mL/min) 686 mL/min  Dialysate Flow Rate (mL/min) 300 ml/min  HD Safety Checks Performed Yes  Intra-Hemodialysis Comments Progressing as prescribed;Tolerated well;Tx completed  Dialysis Fluid Bolus Normal Saline  Bolus Amount (mL) 300 mL   Received patient in bed to unit.  Alert and oriented.  Informed consent signed and in chart.     Patient tolerated well.  Transported back to the room  Alert, without acute distress.  Hand-off given to patient's nurse.   Access used: [facility] Access issues: none  Total UF removed: 640mL Medication(s) given: reglan PO, Compazine IV    [facility]

## 2022-09-14 NOTE — ED Notes (Signed)
Will hold oral medication until stomach calms down so I can administer oral medications.

## 2022-09-14 NOTE — Assessment & Plan Note (Addendum)
Improved  - continue scheduled reglan - continue prn compazine  - continue to monitor

## 2022-09-14 NOTE — Consult Note (Signed)
[facility] Renal Consultation Note    Indication for Consultation:  Management of ESRD/hemodialysis, anemia, hypertension/volume, and secondary hyperparathyroidism. PCP:  HPI: Jose [name] is a 46 y.o. male with ESRD, HTN, T2DM with gastroparesis, HL who was admitted with HTN urgency and PD catheter dysfunction.  Had a full HD yesterday - afterwards, he went to have his fairly new PD catheter flushed, but they were unable to instill much fluid. Therefore, tPA was put in his line with the plan to go home and have the tPA dwell for 2 hours and then return to the unit to see if cath could be flushed. However, when he got home, he noticed that there was "fluid" leaking around his PD cath exit site, therefore the presented to the ED instead.  The PD cath is fairly new - s/p revision/lysis of adhesions on 11/15. He had the cath flushed once 11/17 which went fine. Yesterday's flush was only the 2nd time. He had some burning abd pain yesterday.  In the ED, was noted to have very high BP - up to 219/98. He was afebrile. He was having headache and episode vomiting which was felt to be due to HTN. Labs with Na 134, K 4.2, CO2 28, BUN 15, Cr 2.3, Alb 3.3, WBC 8, Hgb 9.2. Head CT and abd CT ok. Largely due to the HTN urgency, he was admitted and started on NTG drip.  Today, he looks well except BP remains high. No fever, chills, no dyspnea/CP. + nausea and episode vomiting, which are common for him with significant gastroparesis. Has been having daily Bms.  Past Medical History:  Diagnosis Date   AKI (acute kidney injury) ([place])    Anemia    Family history of adverse reaction to anesthesia    daughter age 1 months , coded after recieving 1 injection  for surgery - an inhalation med and morphine   GAD (generalized anxiety disorder)    Headache    Hyperlipidemia    Hypertension    Macular degeneration, bilateral    Pneumonia    Retinopathy    Type II diabetes mellitus with  complication, uncontrolled    retinopathy, neuropathy, microalbuminuria   Past Surgical History:  Procedure Laterality Date   APPENDECTOMY     BIOPSY  08/10/2021   Procedure: BIOPSY;  Surgeon: Otis Brace, MD;  Location: [place];  Service: Gastroenterology;;   BUBBLE STUDY  07/29/2021   Procedure: BUBBLE STUDY;  Surgeon: Jerline Pain, MD;  Location: [facility];  Service: Cardiovascular;;   CAPD INSERTION N/A 08/25/2022   Procedure: LAPAROSCOPIC INSERTION CONTINUOUS AMBULATORY PERITONEAL DIALYSIS  (CAPD) CATHETER WITH OMENTOPEXY;  Surgeon: [name], MD;  Location: [facility] OR;  Service: Vascular;  Laterality: N/A;   ESOPHAGOGASTRODUODENOSCOPY (EGD) WITH PROPOFOL N/A 08/10/2021   Procedure: ESOPHAGOGASTRODUODENOSCOPY (EGD) WITH PROPOFOL;  Surgeon: Otis Brace, MD;  Location: [facility]COPY;  Service: Gastroenterology;  Laterality: N/A;   ESOPHAGOGASTRODUODENOSCOPY (EGD) WITH PROPOFOL N/A 08/24/2021   Procedure: ESOPHAGOGASTRODUODENOSCOPY (EGD) WITH PROPOFOL;  Surgeon: Otis Brace, MD;  Location: [place];  Service: Gastroenterology;  Laterality: N/A;   HERNIA REPAIR     IR FLUORO GUIDED NEEDLE PLC ASPIRATION/INJECTION LOC  07/28/2021   LUMBAR LAMINECTOMY/DECOMPRESSION MICRODISCECTOMY N/A 08/07/2021   Procedure: THORACIC FOUR - THORACIC FIVE LAMINECTOMY/DECOMPRESSION OF SPINAL CORD, DEBRIDEMENT OF ABSCESS, MICRODISCECTOMY, INTRAOPERATIVE ULTRASOUND;  Surgeon: Consuella Lose, MD;  Location: [place];  Service: Neurosurgery;  Laterality: N/A;   TEE WITHOUT CARDIOVERSION N/A 07/29/2021   Procedure: TRANSESOPHAGEAL ECHOCARDIOGRAM (TEE);  Surgeon: Jerline Pain, MD;  Location: [facility] ENDOSCOPY;  Service: Cardiovascular;  Laterality: N/A;   TRIGGER FINGER RELEASE Right 10/25/2019   Procedure: RIGHT INDEX FINGER RELEASE TRIGGER FINGER/A-1 PULLEY;  Surgeon: Daryll Brod, MD;  Location: [place];  Service: Orthopedics;  Laterality: Right;  IV REGIONAL FOREARM  BLOCK   Family History  Problem Relation Age of Onset   Diabetes Mother    Hyperlipidemia Mother    Stroke Mother    Diabetes Father    Hyperlipidemia Brother    Stroke Brother    ADD / ADHD Brother    ADD / ADHD Son    Social History:  reports that he quit smoking about 2 years ago. His smoking use included cigarettes. He has a 20.00 pack-year smoking history. He has never used smokeless tobacco. He reports that he does not currently use alcohol. He reports that he does not use drugs.  ROS: As per HPI otherwise negative.  Physical Exam: Vitals:   09/14/22 0900 09/14/22 1000 09/14/22 1015 09/14/22 1100  BP: (!) 163/88 (!) 166/90 (!) 157/94 131/78  Pulse: 80 84 81 81  Resp: 19 17 17 17   Temp:      TempSrc:      SpO2: 92% 95% 94% 96%  Weight:      Height:         General: Well developed, well nourished, in no acute distress. Room air. Head: Normocephalic, atraumatic, sclera non-icteric, mucus membranes are moist. Neck: Supple without lymphadenopathy/masses. JVD not elevated. Lungs: Clear bilaterally to auscultation without wheezes, rales, or rhonchi. Breathing is unlabored. Heart: RRR with normal S1, S2. No murmurs, rubs, or gallops appreciated. Abdomen: Soft, non-tender, non-distended with normoactive bowel sounds. PD cath in L abdomen, no drainage or irritation. Multiple glued incisions from recent revision. Musculoskeletal:  Strength and tone appear normal for age. Lower extremities: No edema or ischemic changes, no open wounds. Neuro: Alert and oriented X 3. Moves all extremities spontaneously. Psych:  Responds to questions appropriately with a normal affect. Dialysis Access: TDC in R chest, PD cath in L abdomen.  Allergies  Allergen Reactions   Cranberry Itching   Hm Lidocaine Patch [Lidocaine] Dermatitis    Blisters skin    Melatonin Other (See Comments)    nightmares   Nsaids Other (See Comments)    Stage 3 kidney disease   Trulicity [Dulaglutide] Other (See  Comments)    Night sweats  Uncontrolled tempeture    Prior to Admission medications   Medication Sig Start Date End Date Taking? Authorizing Provider  acetaminophen (TYLENOL) 500 MG tablet Take 500-1,000 mg by mouth every 6 (six) hours as needed for mild pain.   Yes [provider]  albuterol (VENTOLIN HFA) 108 (90 Base) MCG/ACT inhaler Inhale 1 puff into the lungs every 4 (four) hours as needed for wheezing or shortness of breath. 05/27/21  Yes [provider]  ALPRAZolam (XANAX) 0.25 MG tablet Take 0.25 mg by mouth 2 (two) times daily as needed for anxiety. 07/22/22  Yes [provider]  atorvastatin (LIPITOR) 40 MG tablet Take 40 mg by mouth daily.   Yes [provider]  cholecalciferol (VITAMIN D3) 25 MCG (1000 UNIT) tablet Take 1,000 Units by mouth daily.   Yes [provider]  Continuous Blood Gluc Sensor (FREESTYLE LIBRE 3 SENSOR) MISC 1 Device by Does not apply route every 14 (fourteen) days. Apply 1 sensor on upper arm every 14 days for continuous glucose monitoring 04/29/22  Yes Elayne Snare,  MD  gabapentin (NEURONTIN) 100 MG capsule Take 2 capsules (200 mg total) by mouth 2 (two) times daily. 09/21/21  Yes Love, Ivan Anchors, PA-C  hydrALAZINE (APRESOLINE) 50 MG tablet Take 1 tablet (50 mg total) by mouth every 8 (eight) hours. Patient taking differently: Take 50 mg by mouth 3 (three) times daily. 06/08/22  Yes Elgergawy, Silver Huguenin, MD  hydrOXYzine (ATARAX) 25 MG tablet TAKE 1 TABLET(25 MG) BY MOUTH THREE TIMES DAILY AS NEEDED FOR ANXIETY OR NAUSEA 09/09/22  Yes Lovorn, Megan, MD  insulin NPH-regular Human (70-30) 100 UNIT/ML injection Inject 8-12 Units into the skin See admin instructions. Taking 10 units in the Am and 14 units at night   Yes [provider]  iron polysaccharides (NIFEREX) 150 MG capsule Take 1 capsule (150 mg total) by mouth daily. 07/06/22  Yes Sheikh, Omair Latif, DO  metoCLOPramide (REGLAN) 5 MG tablet Take 1 tablet (5 mg  total) by mouth every 8 (eight) hours as needed for nausea. 08/20/22 09/19/22 Yes Gerrit Heck, MD  Metoprolol Tartrate 75 MG TABS Take 75 mg by mouth 2 (two) times daily. 06/08/22  Yes Elgergawy, Silver Huguenin, MD  mirtazapine (REMERON) 15 MG tablet Take 1 tablet (15 mg total) by mouth at bedtime. 09/21/21  Yes Love, Ivan Anchors, PA-C  oxyCODONE (ROXICODONE) 5 MG immediate release tablet Take 1 tablet (5 mg total) by mouth every 6 (six) hours as needed. 09/08/22 09/08/23 Yes Ulyses Amor, PA-C  pantoprazole (PROTONIX) 40 MG tablet Take 1 tablet (40 mg total) by mouth daily. 07/06/22  Yes Sheikh, Omair Latif, DO  PARoxetine (PAXIL) 20 MG tablet Take 1 tablet (20 mg total) by mouth at bedtime. 09/21/21  Yes Love, Ivan Anchors, PA-C  polyethylene glycol (MIRALAX / GLYCOLAX) 17 g packet Take 17 g by mouth daily as needed for moderate constipation, mild constipation or severe constipation. 08/29/21  Yes Dahal, Marlowe Aschoff, MD  tamsulosin (FLOMAX) 0.4 MG CAPS capsule Take 1 capsule (0.4 mg total) by mouth daily after supper. 09/21/21  Yes Love, Ivan Anchors, PA-C  torsemide 40 MG TABS Take 40 mg by mouth daily. 07/06/22  Yes Sheikh, Omair Latif, DO  traZODone (DESYREL) 100 MG tablet Take 100 mg by mouth at bedtime as needed for sleep.   Yes [provider]  Insulin Pen Needle (PENTIPS) 32G X 4 MM MISC use as directed 09/03/22   Elayne Snare, MD   Current Facility-Administered Medications  Medication Dose Route Frequency Provider Last Rate Last Admin   acetaminophen (TYLENOL) tablet 650 mg  650 mg Oral Q6H PRN Coralyn Pear B, MD       albuterol (PROVENTIL) (2.5 MG/3ML) 0.083% nebulizer solution 3 mL  3 mL Inhalation Q4H PRN Coralyn Pear B, MD       atorvastatin (LIPITOR) tablet 40 mg  40 mg Oral Daily Coralyn Pear B, MD   40 mg at 09/14/22 1019   Chlorhexidine Gluconate Cloth 2 % PADS 6 each  6 each Topical Q0600 Loren Racer, PA-C       diclofenac Sodium (VOLTAREN) 1 % topical gel 2 g  2 g Topical TID  AC & HS Coralyn Pear B, MD   2 g at 09/14/22 1021   gabapentin (NEURONTIN) capsule 200 mg  200 mg Oral BID Coralyn Pear B, MD   200 mg at 09/14/22 1019   heparin injection 5,000 Units  5,000 Units Subcutaneous Q8H Coralyn Pear B, MD   5,000 Units at 09/13/22 2307   hydrALAZINE (APRESOLINE) tablet 50  mg  50 mg Oral Q8H Lowry Ram, MD   50 mg at 09/13/22 2307   insulin aspart (novoLOG) injection 0-6 Units  0-6 Units Subcutaneous TID WC Coralyn Pear B, MD   2 Units at 09/14/22 1032   insulin NPH Human (NOVOLIN N) injection 8 Units  8 Units Subcutaneous BID WC Ganta, Anupa, DO       metoCLOPramide (REGLAN) tablet 5 mg  5 mg Oral Q8H PRN Arlyce Dice, MD       metoprolol tartrate (LOPRESSOR) tablet 75 mg  75 mg Oral BID Lowry Ram, MD   75 mg at 09/14/22 1019   nitroGLYCERIN 50 mg in dextrose 5 % 250 mL (0.2 mg/mL) infusion  0-200 mcg/min Intravenous Titrated Ezequiel Essex, MD 6 mL/hr at 09/14/22 0547 20 mcg/min at 09/14/22 0547   pantoprazole (PROTONIX) EC tablet 40 mg  40 mg Oral Daily Coralyn Pear B, MD       polyethylene glycol (MIRALAX / GLYCOLAX) packet 17 g  17 g Oral Daily PRN Alesia Morin, MD       torsemide (DEMADEX) tablet 40 mg  40 mg Oral Daily Arlyce Dice, MD       Current Outpatient Medications  Medication Sig Dispense Refill   acetaminophen (TYLENOL) 500 MG tablet Take 500-1,000 mg by mouth every 6 (six) hours as needed for mild pain.     albuterol (VENTOLIN HFA) 108 (90 Base) MCG/ACT inhaler Inhale 1 puff into the lungs every 4 (four) hours as needed for wheezing or shortness of breath.     ALPRAZolam (XANAX) 0.25 MG tablet Take 0.25 mg by mouth 2 (two) times daily as needed for anxiety.     atorvastatin (LIPITOR) 40 MG tablet Take 40 mg by mouth daily.     cholecalciferol (VITAMIN D3) 25 MCG (1000 UNIT) tablet Take 1,000 Units by mouth daily.     Continuous Blood Gluc Sensor (FREESTYLE LIBRE 3 SENSOR) MISC 1 Device by Does not apply route every 14 (fourteen)  days. Apply 1 sensor on upper arm every 14 days for continuous glucose monitoring 2 each 2   gabapentin (NEURONTIN) 100 MG capsule Take 2 capsules (200 mg total) by mouth 2 (two) times daily. 120 capsule 0   hydrALAZINE (APRESOLINE) 50 MG tablet Take 1 tablet (50 mg total) by mouth every 8 (eight) hours. (Patient taking differently: Take 50 mg by mouth 3 (three) times daily.) 150 tablet 0   hydrOXYzine (ATARAX) 25 MG tablet TAKE 1 TABLET(25 MG) BY MOUTH THREE TIMES DAILY AS NEEDED FOR ANXIETY OR NAUSEA 90 tablet 1   insulin NPH-regular Human (70-30) 100 UNIT/ML injection Inject 8-12 Units into the skin See admin instructions. Taking 10 units in the Am and 14 units at night     iron polysaccharides (NIFEREX) 150 MG capsule Take 1 capsule (150 mg total) by mouth daily. 30 capsule 0   metoCLOPramide (REGLAN) 5 MG tablet Take 1 tablet (5 mg total) by mouth every 8 (eight) hours as needed for nausea. 90 tablet 1   Metoprolol Tartrate 75 MG TABS Take 75 mg by mouth 2 (two) times daily. 60 tablet 0   mirtazapine (REMERON) 15 MG tablet Take 1 tablet (15 mg total) by mouth at bedtime. 30 tablet 0   oxyCODONE (ROXICODONE) 5 MG immediate release tablet Take 1 tablet (5 mg total) by mouth every 6 (six) hours as needed. 20 tablet 0   pantoprazole (PROTONIX) 40 MG tablet Take 1 tablet (40 mg total) by mouth daily. [place]  tablet 0   PARoxetine (PAXIL) 20 MG tablet Take 1 tablet (20 mg total) by mouth at bedtime. 30 tablet 0   polyethylene glycol (MIRALAX / GLYCOLAX) 17 g packet Take 17 g by mouth daily as needed for moderate constipation, mild constipation or severe constipation. 14 each 0   tamsulosin (FLOMAX) 0.4 MG CAPS capsule Take 1 capsule (0.4 mg total) by mouth daily after supper. 30 capsule 0   torsemide 40 MG TABS Take 40 mg by mouth daily. 30 tablet 0   traZODone (DESYREL) 100 MG tablet Take 100 mg by mouth at bedtime as needed for sleep.     Insulin Pen Needle (PENTIPS) 32G X 4 MM MISC use as directed 100  each 1   Labs: Basic Metabolic Panel: Recent Labs  Lab 09/08/22 0703 09/13/22 1639 09/14/22 0100  NA 136 134* 135  K 4.5 4.2 3.8  CL 94* 95* 96*  CO2  --  28 28  GLUCOSE 298* 226* 196*  BUN 25* 15 17  CREATININE 3.30* 2.30* 2.67*  CALCIUM  --  7.9* 8.1*   Liver Function Tests: Recent Labs  Lab 09/13/22 1639 09/14/22 0100  AST 25 20  ALT 16 13  ALKPHOS 155* 157*  BILITOT 0.5 0.7  PROT 6.3* 6.1*  ALBUMIN 3.3* 3.1*   CBC: Recent Labs  Lab 09/08/22 0703 09/13/22 1639 09/14/22 0100  WBC  --  8.0 8.3  NEUTROABS  --  4.9  --   HGB 9.9* 9.2* 9.2*  HCT 29.0* 26.5* 26.0*  MCV  --  86.6 86.1  PLT  --  208 216   CBG: Recent Labs  Lab 09/08/22 1025 09/08/22 1050 09/08/22 1115 09/14/22 0919 09/14/22 1031  GLUCAP 83 69* 130* 245* 244*   Studies/Results: CT Head Wo Contrast  Result Date: 09/13/2022 CLINICAL DATA:  Headache with hypertension EXAM: CT HEAD WITHOUT CONTRAST TECHNIQUE: Contiguous axial images were obtained from the base of the skull through the vertex without intravenous contrast. RADIATION DOSE REDUCTION: This exam was performed according to the departmental dose-optimization program which includes automated exposure control, adjustment of the mA and/or kV according to patient size and/or use of iterative reconstruction technique. COMPARISON:  CT head 06/05/2022 FINDINGS: Brain: No evidence of acute infarction, hemorrhage, hydrocephalus, extra-axial collection or mass lesion/mass effect. Vascular: No hyperdense vessel or unexpected calcification. Skull: Normal. Negative for fracture or focal lesion. Sinuses/Orbits: No acute finding. Other: None. IMPRESSION: No acute intracranial pathology. Electronically Signed   By: Ronney Asters M.D.   On: 09/13/2022 20:30   CT ABDOMEN PELVIS WO CONTRAST  Result Date: 09/13/2022 CLINICAL DATA:  Postop diagnostic laparoscopy and revision of peritoneal dialysis catheter 09/08/2022. Drainage from abdominal incision. Headache  and back pain. EXAM: CT ABDOMEN AND PELVIS WITHOUT CONTRAST TECHNIQUE: Multidetector CT imaging of the abdomen and pelvis was performed following the standard protocol without IV contrast. RADIATION DOSE REDUCTION: This exam was performed according to the departmental dose-optimization program which includes automated exposure control, adjustment of the mA and/or kV according to patient size and/or use of iterative reconstruction technique. COMPARISON:  Abdominopelvic CT 09/01/2022. FINDINGS: Lower chest: Hemodialysis catheter extends into the inferior aspect of the right atrium, similar to previous CT. Mild dependent atelectasis at both lung bases. No significant pleural or pericardial effusion. Coronary artery calcifications are noted. Hepatobiliary: No focal hepatic abnormalities are identified on noncontrast imaging. There are small dependent calcified gallstones. No evidence of gallbladder wall thickening, surrounding inflammation or biliary dilatation. Pancreas: Unremarkable. No pancreatic ductal dilatation  or surrounding inflammatory changes. Spleen: Normal in size without focal abnormality. Adrenals/Urinary Tract: Both adrenal glands appear normal. No evidence of urinary tract calculus, suspicious renal lesion or hydronephrosis. The bladder appears unremarkable for its degree of distention. Stomach/Bowel: No enteric contrast administered. The stomach appears unremarkable for its degree of distension. No evidence of bowel wall thickening, distention or surrounding inflammatory change. Previous appendectomy. Vascular/Lymphatic: There are no enlarged abdominal or pelvic lymph nodes. Mild aortoiliac atherosclerosis without evidence of aneurysm. Reproductive: The prostate gland and seminal vesicles appear unremarkable. Other: Peritoneal dialysis catheter is coiled between the bladder and sigmoid colon. There is a small amount of soft tissue stranding and emphysema within the subcutaneous fat surrounding the  catheter within the anterior abdominal wall. No drainable fluid collection. Trace peritoneal fluid without focal loculation. Minimal pneumoperitoneum attributed to the recent surgery and/or peritoneal catheter. Musculoskeletal: No acute or significant osseous findings. IMPRESSION: 1. Small amount of soft tissue stranding and emphysema within the subcutaneous fat surrounding the peritoneal dialysis catheter within the anterior abdominal wall. No drainable fluid collection. 2. Cholelithiasis without evidence of cholecystitis or biliary dilatation. 3. Mild dependent atelectasis at both lung bases. 4. Aortic Atherosclerosis (ICD10-I70.0). Electronically Signed   By: Richardean Sale M.D.   On: 09/13/2022 18:37    Dialysis Orders:  TCU [facility] - MTuThF 3hr, 400/A1.5, EDW 90kg, 2K/2Ca, TDC R chest, no heparin - Calcitriol 0.72mcg PO q HD - Mircera 123mcg IV q 2 weeks (last given 173mcg on 11/2)  Assessment/Plan:  PD catheter dysfunction: Recent revision/lysis of adhesions on 11/15 by Dr. Stanford Breed. tPA instilled at HD unit yesterday after issues flushing cath - plan was to dwell x 2hr at home then return, but he noticed "leaking" of fluid and so presented to ED. Looks dry on exam today - I have placed a call to [facility] for consult to see if they are concerned about it, unclear. I suppose we should get a cell count/Cx and cover him with abx - will be bring him up for HEMOdialysis today and can take care of that at the same time.  ESRD:  Usual MTuThF schedule as outpatient - s/p HD yesterday. Will dialyze today to get back on schedule and likely can have his next treatment on Friday (as outpatient).  Hypertension/volume: BP sky high initially, better now with NTG drip. Doesn't really look overloaded, would resume home meds.  Anemia: Hgb 9.2 - due for ESA, will give tonight.  Metabolic bone disease: Ca ok, Phos pending.  T2DM: Uncontrolled, per primary.  Veneta Penton, PA-C 09/14/2022, 11:19 AM  [employer]

## 2022-09-14 NOTE — ED Notes (Signed)
Checked patient cbg it was 245 patient is resting with call bell in reach

## 2022-09-14 NOTE — ED Notes (Signed)
PT working with patient.

## 2022-09-14 NOTE — Assessment & Plan Note (Deleted)
No known recent trauma or injury. Patient reports his right shoulder throbs when he is volume overloaded and needs dialysis -Pain regimen: Tylenol, apply heat/ice, Voltaren gel -- Cannot use lidocaine cream as reported intolerance -- Nephro consulted for HD, see ESRD section for more

## 2022-09-14 NOTE — ED Notes (Signed)
Bed placement stated the nurse on 6E rejected first attempt for pt being d/c after HD neither myself nor her could see a d/c order bed placed back in line for pt.  All belongings sent with pt

## 2022-09-14 NOTE — Progress Notes (Signed)
Contacted TCU at [facility] and spoke to staff. Pt can resume at [place] on Friday at 8:00 am. Will update clinic on d/c date once known. Clinic closed on Thursday for the holiday.   Melven Sartorius Renal Navigator [phone]

## 2022-09-14 NOTE — Progress Notes (Addendum)
Daily Progress Note Intern Pager: [phone]  Patient name: Jose [name] Medical record number: [id number] Date of birth: 1975/12/14 Age: 46 y.o. Gender: male  Primary Care Provider: Roddie Mc, NP Consultants: Nephrology Code Status: Full   Pt Overview and Major Events to Date:  11/20 - Admitted   Assessment and Plan: Jose [name] is a 46 y.o. male presenting with drainage at hemodialysis site and was found to have hypertensive emergency (systolics in 017P and headache).  PMH includes hypertension, ESRD on HD M/T/TH/F (transitioning to PD), type 2 diabetes, osteomyelitis, BPH, constipation, peripheral neuropathy, macular degeneration, hyperlipidemia, GAD.   * Poorly-controlled hypertension in the setting of missed dialysis BP only mildly hypertensive this AM. Nausea is limiting factor for restarting home medications.  - Restart home metoprolol, torsemide, and hydralazine, and titrate down nitro drip  - Continue to monitor closely - Cardiac monitoring  QT prolongation QT on EKG this AM 490 - Avoid QT prolonging medications, holding home trazodone and mirtazapine, paroxetine  - Can restart reglan given improving QT and risk benefit regarding patient taking his hypertensive medications and eating with his history of gastroparesis - Replete K and Mg as needed - Daily EKG until resolved  ESRD (end stage renal disease) ([place]) Hemodialysis schedule M/T/TH/F.  Oliguric. PD catheter with drainage.  - Nephrology consulted, appreciate recs - Plan for hemodialysis during hospital stay - Consult IR for re-catheterizing PD catheter  - RFP on days of dialysis  Type 2 diabetes mellitus with chronic kidney disease, with long-term current use of insulin ([place]) A1c 05/2022 was 7.7.  Takes 12 units of insulin BID, uses 70/30 mix. - CBGs AC, qHS  -- Continuing home insulin with sensitive SSI for ESRD  Nausea & vomiting Patient has nausea and vomiting limiting him from taking his  home medications to treat his hypertension. - Restart reglan given improving QT , and need for po intake and po medications  - Continue to monitor   Shoulder pain, right No known recent trauma or injury. Patient reports his right shoulder throbs when he is volume overloaded and needs dialysis -Pain regimen: Tylenol, apply heat/ice, Voltaren gel -- Cannot use lidocaine cream as reported intolerance -- Nephro consulted for HD, see ESRD section for more    FEN/GI: renal diet with fluid restriction 1200 mL  PPx: heparin for DVT  Dispo:Admitted pending clinical improvement of hypertension   Subjective:  Patient said he is still nauseous, but that headache is resolving. No vision loss, chest pain, or shortness of breath.   Objective: Temp:  [97.5 F (36.4 C)-99.4 F (37.4 C)] 99.4 F (37.4 C) (11/21 0725) Pulse Rate:  [69-88] 81 (11/21 1015) Resp:  [13-20] 17 (11/21 1015) BP: (107-226)/(52-104) 157/94 (11/21 1015) SpO2:  [92 %-100 %] 94 % (11/21 1015) Weight:  [90.7 kg] 90.7 kg (11/20 1529) Physical Exam: General: Relatively well appearing, sweating, conversant  Cardiovascular: RRR, well perfused Respiratory: CTAB, normal work of breathing on room air, coughing Abdomen: No pain to palpation, non distended, soft, dressing over PD catheter site, some serosanguinous drainage Extremities: No lower extremity edema   Laboratory: Most recent CBC Lab Results  Component Value Date   WBC 8.3 09/14/2022   HGB 9.2 (L) 09/14/2022   HCT 26.0 (L) 09/14/2022   MCV 86.1 09/14/2022   PLT 216 09/14/2022   Most recent BMP    Latest Ref Rng & Units 09/14/2022    1:00 AM  BMP  Glucose 70 - 99 mg/dL 196  BUN 6 - 20 mg/dL 17   Creatinine 0.61 - 1.24 mg/dL 2.67   Sodium 135 - 145 mmol/L 135   Potassium 3.5 - 5.1 mmol/L 3.8   Chloride 98 - 111 mmol/L 96   CO2 22 - 32 mmol/L 28   Calcium 8.9 - 10.3 mg/dL 8.1     EKG Normal sinus rhythm, QTC of 490  Lowry Ram, MD 09/14/2022,  10:35 AM  PGY-1, [place] Intern pager: [phone], text pages welcome Secure chat group [place]

## 2022-09-14 NOTE — Progress Notes (Signed)
Pt feeling hypotensive uf paused given 121ml NS pt remains alert

## 2022-09-14 NOTE — Progress Notes (Signed)
Pt stable no c/os uf remains off for bp support

## 2022-09-14 NOTE — Progress Notes (Addendum)
OT Cancellation Note  Patient Details Name: Jose [name] MRN: [id number] DOB: [birth year]   Cancelled Treatment:    Reason Eval/Treat Not Completed: OT screened, no needs identified, will sign off Per PT, no formal OT eval needed. Pt moving well, supervision provided for safety during mobility due to orthostasis.   Layla Maw 09/14/2022, 1:06 PM

## 2022-09-14 NOTE — Progress Notes (Signed)
Patient unable to tolerate PO HTN meds due to nausea and vomiting. We must be careful given his prolonged Qtc, last 517.   - Added on compazine - Spoke w/ pharmacy, nitrodrip better choice than cardene, ordered nitro titration - tylenol for headache  We will continue to monitor closely.   Ezequiel Essex, MD

## 2022-09-14 NOTE — ED Notes (Signed)
Bed placement notified that pt is in route to HD

## 2022-09-14 NOTE — Progress Notes (Signed)
Attempted aspiration from PD catheter per protocol due to TPA dwell unable to aspirated attempted repositioning remains unsuccessful. Dr Joelyn Oms notified pt updated on plan for MD to evaluate.

## 2022-09-14 NOTE — Assessment & Plan Note (Addendum)
Resolved. Qtc 425.  - Restarted trazadone and remeron on discharge.

## 2022-09-15 ENCOUNTER — Inpatient Hospital Stay (HOSPITAL_COMMUNITY): Payer: 59

## 2022-09-15 DIAGNOSIS — I160 Hypertensive urgency: Secondary | ICD-10-CM | POA: Diagnosis not present

## 2022-09-15 DIAGNOSIS — N186 End stage renal disease: Secondary | ICD-10-CM

## 2022-09-15 DIAGNOSIS — I10 Essential (primary) hypertension: Secondary | ICD-10-CM | POA: Diagnosis not present

## 2022-09-15 DIAGNOSIS — Z992 Dependence on renal dialysis: Secondary | ICD-10-CM

## 2022-09-15 LAB — COMPREHENSIVE METABOLIC PANEL
ALT: 13 U/L (ref 0–44)
AST: 15 U/L (ref 15–41)
Albumin: 3.5 g/dL (ref 3.5–5.0)
Alkaline Phosphatase: 165 U/L — ABNORMAL HIGH (ref 38–126)
Anion gap: 20 — ABNORMAL HIGH (ref 5–15)
BUN: 20 mg/dL (ref 6–20)
CO2: 25 mmol/L (ref 22–32)
Calcium: 8.7 mg/dL — ABNORMAL LOW (ref 8.9–10.3)
Chloride: 91 mmol/L — ABNORMAL LOW (ref 98–111)
Creatinine, Ser: 2.91 mg/dL — ABNORMAL HIGH (ref 0.61–1.24)
GFR, Estimated: 26 mL/min — ABNORMAL LOW (ref >60)
Glucose, Bld: 223 mg/dL — ABNORMAL HIGH (ref 70–99)
Potassium: 4 mmol/L (ref 3.5–5.1)
Sodium: 136 mmol/L (ref 135–145)
Total Bilirubin: 1 mg/dL (ref 0.3–1.2)
Total Protein: 6.6 g/dL (ref 6.5–8.1)

## 2022-09-15 LAB — CBC WITH DIFFERENTIAL/PLATELET
Abs Immature Granulocytes: 0.07 10*3/uL (ref 0.00–0.07)
Basophils Absolute: 0 10*3/uL (ref 0.0–0.1)
Basophils Relative: 0 %
Eosinophils Absolute: 0 10*3/uL (ref 0.0–0.5)
Eosinophils Relative: 0 %
HCT: 29.7 % — ABNORMAL LOW (ref 39.0–52.0)
Hemoglobin: 10.2 g/dL — ABNORMAL LOW (ref 13.0–17.0)
Immature Granulocytes: 0 %
Lymphocytes Relative: 8 %
Lymphs Abs: 1.6 10*3/uL (ref 0.7–4.0)
MCH: 29.8 pg (ref 26.0–34.0)
MCHC: 34.3 g/dL (ref 30.0–36.0)
MCV: 86.8 fL (ref 80.0–100.0)
Monocytes Absolute: 2.2 10*3/uL — ABNORMAL HIGH (ref 0.1–1.0)
Monocytes Relative: 12 %
Neutro Abs: 15.3 10*3/uL — ABNORMAL HIGH (ref 1.7–7.7)
Neutrophils Relative %: 80 %
Platelets: 262 10*3/uL (ref 150–400)
RBC: 3.42 MIL/uL — ABNORMAL LOW (ref 4.22–5.81)
RDW: 13.2 % (ref 11.5–15.5)
WBC: 19.2 10*3/uL — ABNORMAL HIGH (ref 4.0–10.5)
nRBC: 0 % (ref 0.0–0.2)

## 2022-09-15 LAB — GLUCOSE, CAPILLARY
Glucose-Capillary: 228 mg/dL — ABNORMAL HIGH (ref 70–99)
Glucose-Capillary: 219 mg/dL — ABNORMAL HIGH (ref 70–99)
Glucose-Capillary: 179 mg/dL — ABNORMAL HIGH (ref 70–99)
Glucose-Capillary: 110 mg/dL — ABNORMAL HIGH (ref 70–99)
Glucose-Capillary: 149 mg/dL — ABNORMAL HIGH (ref 70–99)

## 2022-09-15 LAB — CBC
HCT: 28.9 % — ABNORMAL LOW (ref 39.0–52.0)
Hemoglobin: 10.2 g/dL — ABNORMAL LOW (ref 13.0–17.0)
MCH: 30.4 pg (ref 26.0–34.0)
MCHC: 35.3 g/dL (ref 30.0–36.0)
MCV: 86.3 fL (ref 80.0–100.0)
Platelets: 244 10*3/uL (ref 150–400)
RBC: 3.35 MIL/uL — ABNORMAL LOW (ref 4.22–5.81)
RDW: 13.2 % (ref 11.5–15.5)
WBC: 17.3 10*3/uL — ABNORMAL HIGH (ref 4.0–10.5)
nRBC: 0 % (ref 0.0–0.2)

## 2022-09-15 LAB — C-REACTIVE PROTEIN: CRP: 2.4 mg/dL — ABNORMAL HIGH (ref <1.0)

## 2022-09-15 MED ORDER — METOCLOPRAMIDE HCL 5 MG PO TABS: 5.0000 mg | ORAL_TABLET | Freq: Three times a day (TID) | ORAL | Status: DC

## 2022-09-15 MED ORDER — DARBEPOETIN ALFA 60 MCG/0.3ML IJ SOSY
60.0000 ug | PREFILLED_SYRINGE | INTRAMUSCULAR | Status: DC
  Filled 2022-09-15: qty 0.3

## 2022-09-15 MED ORDER — PROCHLORPERAZINE EDISYLATE 10 MG/2ML IJ SOLN
10.0000 mg | Freq: Once | INTRAMUSCULAR | Status: AC
  Administered 2022-09-15: 10 mg via INTRAVENOUS
  Filled 2022-09-15: qty 2

## 2022-09-15 MED ORDER — METOCLOPRAMIDE HCL 5 MG/ML IJ SOLN: 5.0000 mg | Freq: Three times a day (TID) | INTRAMUSCULAR | Status: DC

## 2022-09-15 MED ORDER — METOCLOPRAMIDE HCL 5 MG/ML IJ SOLN
5.0000 mg | Freq: Three times a day (TID) | INTRAMUSCULAR | Status: DC
  Administered 2022-09-15 – 2022-09-16 (×3): 5 mg via INTRAVENOUS
  Filled 2022-09-15 (×3): qty 2

## 2022-09-15 MED ORDER — METOCLOPRAMIDE HCL 5 MG PO TABS
5.0000 mg | ORAL_TABLET | Freq: Three times a day (TID) | ORAL | Status: DC
  Administered 2022-09-16 – 2022-09-17 (×4): 5 mg via ORAL
  Filled 2022-09-15 (×5): qty 1

## 2022-09-15 MED ORDER — PAROXETINE HCL 20 MG PO TABS
20.0000 mg | ORAL_TABLET | Freq: Every day | ORAL | Status: DC
  Administered 2022-09-15 – 2022-09-16 (×2): 20 mg via ORAL
  Filled 2022-09-15 (×2): qty 1

## 2022-09-15 NOTE — Progress Notes (Addendum)
   VASCULAR SURGERY ASSESSMENT & PLAN:   NONFUNCTIONING PD CATHETER: Catheter is not flushing.  It has already been replaced once.  I have discussed with the surgeons on our team who do the PD catheters and they agree that currently there is no indication to try to replace the catheter.  Currently this does not appear to be infected so there is no urgent need to remove it.  He has a functioning hemodialysis catheter.   SUBJECTIVE:   Still was some nausea.  PHYSICAL EXAM:   Vitals:   09/14/22 2305 09/15/22 0020 09/15/22 0348 09/15/22 0544  BP: (!) 149/87 (!) 146/88 134/73 119/74  Pulse: 86 83 83 81  Resp: 16 16 16 16   Temp: 97.7 F (36.5 C) 97.6 F (36.4 C) 97.7 F (36.5 C)   TempSrc: Oral Oral Oral   SpO2: 95% 95% 96%   Weight:      Height:       No drainage around the catheter or from the port sites. No erythema.  LABS:   Lab Results  Component Value Date   WBC 17.3 (H) 09/15/2022   HGB 10.2 (L) 09/15/2022   HCT 28.9 (L) 09/15/2022   MCV 86.3 09/15/2022   PLT 244 09/15/2022   Lab Results  Component Value Date   CREATININE 2.91 (H) 09/15/2022   Lab Results  Component Value Date   INR 1.2 08/19/2022   CBG (last 3)  Recent Labs    09/14/22 1031 09/14/22 1311 09/15/22 0816  GLUCAP 244* 194* 228*    PROBLEM LIST:    Principal Problem:   Poorly-controlled hypertension in the setting of missed dialysis Active Problems:   Nausea & vomiting   Type 2 diabetes mellitus with chronic kidney disease, with long-term current use of insulin ([facility])   ESRD (end stage renal disease) on dialysis ([facility])   Shoulder pain, right   QT prolongation   Hypertensive emergency   CURRENT MEDS:    atorvastatin  40 mg Oral Daily   Chlorhexidine Gluconate Cloth  6 each Topical Q0600   diclofenac Sodium  2 g Topical TID AC & HS   gabapentin  200 mg Oral BID   heparin  5,000 Units Subcutaneous Q8H   hydrALAZINE  50 mg Oral Q8H   insulin aspart  0-6 Units Subcutaneous TID WC    insulin NPH Human  8 Units Subcutaneous BID WC   metoprolol tartrate  75 mg Oral BID   pantoprazole  40 mg Oral Daily   torsemide  40 mg Oral Daily    Deitra Mayo Office: [phone] 09/15/2022

## 2022-09-15 NOTE — Progress Notes (Addendum)
Daily Progress Note Intern Pager: [phone]  Patient name: Jose [name] Medical record number: [id number] Date of birth: [birth year] Age: 46 y.o. Gender: male  Primary Care Provider: Roddie Mc, NP Consultants: Vascular Surgery, Nephrology  Code Status: Full  Pt Overview and Major Events to Date:  11/20 - Admitted    Assessment and Plan: Jose [name] is a 46 y.o. male presenting with drainage at PD site and uncontrolled hypertension due to missed medications.  PMH includes hypertension, ESRD on HD M/T/TH/F (transitioning to PD), type 2 diabetes, osteomyelitis, BPH, constipation, peripheral neuropathy, macular degeneration, hyperlipidemia, GAD.   * Poorly-controlled hypertension in the setting of missed dialysis BP only mildly hypertensive this AM. Nausea is limiting factor for restarting home medications.  - Discontinue nitro drip, continue home medications  - If patient cannot take home medications due to nausea/vomiting and hypertension is 200s treat with standing IV labetalol or hydralazine  - Continue to monitor closely - Cardiac monitoring  QT prolongation QT on EKG this AM 471 - Restart reglan scheduled and paroxetine, holding remeron and trazodone  - Replete K and Mg as needed - Daily EKG until resolved  ESRD (end stage renal disease) on dialysis ([place]) Hemodialysis schedule M/T/TH/F.  Oliguric. PD catheter with drainage. Vascular surgery will continue to evaluate PD catheter. Not concerning for infection to them. Continue to use HD catheter.  - Nephrology consulted, appreciate recs - Plan for hemodialysis during hospital stay - RFP on days of dialysis  Type 2 diabetes mellitus with chronic kidney disease, with long-term current use of insulin ([place]) A1c 05/2022 was 7.7.  Takes 12 units of insulin BID, uses 70/30 mix. - CBGs AC, qHS  -- Continuing NPH 8 units BID and SSI   Gastroparesis  Patient has nausea and vomiting limiting him from taking his home  medications to treat his hypertension. - Restart reglan given improving QT, and need for po intake and po medications, compazine prn   - Continue to monitor   FEN/GI: regular diet  PPx: heparin Dispo:Remain admitted for workup of high WBC   Subjective:  Patient says that he is nauseous despite reglan this morning. He denies vomiting, palpitations, pain, SOB, CP.   Objective: Temp:  [97 F (36.1 C)-99.1 F (37.3 C)] 99.1 F (37.3 C) (11/22 1159) Pulse Rate:  [75-88] 82 (11/22 1159) Resp:  [16-20] 18 (11/22 1159) BP: (119-199)/(73-110) 146/76 (11/22 1159) SpO2:  [95 %-99 %] 96 % (11/22 1159) Physical Exam: General: Well appearing, laying in bed, uncomfortable appearing Cardiovascular: RRR, Well perfused, cap refill < 2 seconds Respiratory: No increased work of breathing on room air  Abdomen: declined exam, as patient is nauseous, non tender  Extremities: No edema   Laboratory: Most recent CBC Lab Results  Component Value Date   WBC 19.2 (H) 09/15/2022   HGB 10.2 (L) 09/15/2022   HCT 29.7 (L) 09/15/2022   MCV 86.8 09/15/2022   PLT 262 09/15/2022   Most recent BMP    Latest Ref Rng & Units 09/15/2022    3:38 AM  BMP  Glucose 70 - 99 mg/dL 223   BUN 6 - 20 mg/dL 20   Creatinine 0.61 - 1.24 mg/dL 2.91   Sodium 135 - 145 mmol/L 136   Potassium 3.5 - 5.1 mmol/L 4.0   Chloride 98 - 111 mmol/L 91   CO2 22 - 32 mmol/L 25   Calcium 8.9 - 10.3 mg/dL 8.7     Chest Xray  IMPRESSION: No evidence  of acute cardiopulmonary disease.  Lowry Ram, MD 09/15/2022, 5:51 PM  PGY-1, [place] Intern pager: [phone], text pages welcome Secure chat group [place]

## 2022-09-15 NOTE — Progress Notes (Signed)
Mobility Specialist - Progress Note   09/15/22 1533  Mobility  Activity Ambulated independently in room  Level of Assistance Standby assist, set-up cues, supervision of patient - no hands on  Assistive Device Cane  Distance Ambulated (ft) 20 ft  Activity Response Tolerated well  Mobility Referral No  $Mobility charge 1 Mobility   Pt received in bed and agreeable. No complaints throughout. Pt left in BR with all needs met.  Franki Monte  Mobility Specialist Please contact via S[facility] or Rehab office at [phone]

## 2022-09-15 NOTE — Progress Notes (Signed)
FMTS Interim Progress Note  Spoke with Dr. Carlis Abbott with vascular regarding patient's PD catheter for HD. He says that the patient is already on the list to see. Appreciate involvement of vascular surgery team.   Donney Dice, DO 09/15/2022, 7:35 AM PGY-3, [place] [facility] Service pager [phone]

## 2022-09-15 NOTE — Progress Notes (Signed)
FMTS Brief Progress Note  S: Patient was in bed asleep.  No acute distress.   O: BP 137/72 (BP Location: Right Arm)   Pulse 80   Temp 99.3 F (37.4 C) (Oral)   Resp 18   Ht 5\' 11"  (1.803 m)   Wt 90.7 kg   SpO2 93%   BMI 27.89 kg/m     A/P: * Poorly-controlled hypertension in the setting of missed dialysis Currently normotensive. - If SBP greater than 200, start IV labetalol/IV hydralazine as needed - Cardiac monitoring  Leukocytosis WBC 19.2. - Follow-up viral panel - If fevers, start Vanco, cefepime, Flagyl   Alesia Morin, MD 09/15/2022, 9:51 PM PGY-1, [facility] Night Resident  Please page [phone] with questions.

## 2022-09-15 NOTE — Plan of Care (Signed)

## 2022-09-15 NOTE — Progress Notes (Signed)
FMTS Brief Note Reviewed patient's vitals, recent notes.  Vitals:   09/14/22 1937 09/14/22 2305  BP: (!) 161/88 (!) 149/87  Pulse: 84 86  Resp: 16 16  Temp: 97.7 F (36.5 C) 97.7 F (36.5 C)  SpO2: 97% 95%   Hypertension  Admitted for hypertensive emergency Patient remains on nitro drip. Was given PO meds inconsistently today.  For HTN, he is prescribed hydralazine 50 mg every 8 hours, metoprolol 75 mg twice daily, and torsemide 40 mg daily.  Chart review indicates he did not receive his torsemide - RN, indicates "patient is n.p.o., patient going for HD".  He did receive both metoprolol doses.  Of the 3 hydralazine doses scheduled for today, he only received his nighttime dose: AM dose documented as not given but without reason, and 1400 dose not documented in any way.  It is essential we manage his nausea in order for him to get his PO BP meds and transition off nitro IV. If there are barriers to PO administration of his antihypertensive medications, please alert the physician team.   At this time, no change in plan from day progress note.  Ezequiel Essex, MD Page [phone] with questions about this patient.

## 2022-09-15 NOTE — Progress Notes (Signed)
[facility] Progress Note   Subjective:  Seen in room - still having issues with gastroparesis/vomiting. BP great when on nitro drip - has been stopped this AM and attempting PO meds again. Dialyzed yesterday - vomiting throughout his HD. RN attempted to flush PD cath - wouldn't work -  therefore, unable to collect PD fluid studies. Appreciate VVS assistance, looks like plan is to deal with PD cath as outpatient. We talked a bit this morning that we may not be able to make PD happen for him. He has already had 1 revision and now looking at another. With his DM and gastroparesis - I fear that PD would worsen both of these issues. He is young and wants to continue working if possible, home HD may be a better option to consider - he will discuss it with his wife. WBC way up this AM. No CP, dyspnea, abd pain. He did have a bit of rigors on HD yesterday, d/w hospitalist today - would rec Blood Cx.  Objective Vitals:   09/15/22 0020 09/15/22 0348 09/15/22 0544 09/15/22 0834  BP: (!) 146/88 134/73 119/74 (!) 141/80  Pulse: 83 83 81 84  Resp: 16 16 16 17   Temp: 97.6 F (36.4 C) 97.7 F (36.5 C)  98.9 F (37.2 C)  TempSrc: Oral Oral  Oral  SpO2: 95% 96%  95%  Weight:      Height:       Physical Exam General: Ill appearing man, NAD. Room air. Has been vomiting off/on overnight Heart: RRR; no murmur Lungs: CTAB; no rales Abdomen: soft, non-tender. PD cath in L abdomen Extremities: No LE edema Dialysis Access: [facility] in R chest, PD cath in L abdomen  Additional Objective Labs: Basic Metabolic Panel: Recent Labs  Lab 09/13/22 1639 09/14/22 0100 09/15/22 0338  NA 134* 135 136  K 4.2 3.8 4.0  CL 95* 96* 91*  CO2 28 28 25   GLUCOSE 226* 196* 223*  BUN 15 17 20   CREATININE 2.30* 2.67* 2.91*  CALCIUM 7.9* 8.1* 8.7*   Liver Function Tests: Recent Labs  Lab 09/13/22 1639 09/14/22 0100 09/15/22 0338  AST 25 20 15   ALT 16 13 13   ALKPHOS 155* 157* 165*  BILITOT 0.5 0.7 1.0   PROT 6.3* 6.1* 6.6  ALBUMIN 3.3* 3.1* 3.5   CBC: Recent Labs  Lab 09/13/22 1639 09/14/22 0100 09/15/22 0338  WBC 8.0 8.3 17.3*  NEUTROABS 4.9  --   --   HGB 9.2* 9.2* 10.2*  HCT 26.5* 26.0* 28.9*  MCV 86.6 86.1 86.3  PLT 208 216 244   CBG: Recent Labs  Lab 09/08/22 1115 09/14/22 0919 09/14/22 1031 09/14/22 1311 09/15/22 0816  GLUCAP 130* 245* 244* 194* 228*   Studies/Results: CT Head Wo Contrast  Result Date: 09/13/2022 CLINICAL DATA:  Headache with hypertension EXAM: CT HEAD WITHOUT CONTRAST TECHNIQUE: Contiguous axial images were obtained from the base of the skull through the vertex without intravenous contrast. RADIATION DOSE REDUCTION: This exam was performed according to the departmental dose-optimization program which includes automated exposure control, adjustment of the mA and/or kV according to patient size and/or use of iterative reconstruction technique. COMPARISON:  CT head 06/05/2022 FINDINGS: Brain: No evidence of acute infarction, hemorrhage, hydrocephalus, extra-axial collection or mass lesion/mass effect. Vascular: No hyperdense vessel or unexpected calcification. Skull: Normal. Negative for fracture or focal lesion. Sinuses/Orbits: No acute finding. Other: None. IMPRESSION: No acute intracranial pathology. Electronically Signed   By: Ronney Asters M.D.   On: 09/13/2022  20:30   CT ABDOMEN PELVIS WO CONTRAST  Result Date: 09/13/2022 CLINICAL DATA:  Postop diagnostic laparoscopy and revision of peritoneal dialysis catheter 09/08/2022. Drainage from abdominal incision. Headache and back pain. EXAM: CT ABDOMEN AND PELVIS WITHOUT CONTRAST TECHNIQUE: Multidetector CT imaging of the abdomen and pelvis was performed following the standard protocol without IV contrast. RADIATION DOSE REDUCTION: This exam was performed according to the departmental dose-optimization program which includes automated exposure control, adjustment of the mA and/or kV according to patient size  and/or use of iterative reconstruction technique. COMPARISON:  Abdominopelvic CT 09/01/2022. FINDINGS: Lower chest: Hemodialysis catheter extends into the inferior aspect of the right atrium, similar to previous CT. Mild dependent atelectasis at both lung bases. No significant pleural or pericardial effusion. Coronary artery calcifications are noted. Hepatobiliary: No focal hepatic abnormalities are identified on noncontrast imaging. There are small dependent calcified gallstones. No evidence of gallbladder wall thickening, surrounding inflammation or biliary dilatation. Pancreas: Unremarkable. No pancreatic ductal dilatation or surrounding inflammatory changes. Spleen: Normal in size without focal abnormality. Adrenals/Urinary Tract: Both adrenal glands appear normal. No evidence of urinary tract calculus, suspicious renal lesion or hydronephrosis. The bladder appears unremarkable for its degree of distention. Stomach/Bowel: No enteric contrast administered. The stomach appears unremarkable for its degree of distension. No evidence of bowel wall thickening, distention or surrounding inflammatory change. Previous appendectomy. Vascular/Lymphatic: There are no enlarged abdominal or pelvic lymph nodes. Mild aortoiliac atherosclerosis without evidence of aneurysm. Reproductive: The prostate gland and seminal vesicles appear unremarkable. Other: Peritoneal dialysis catheter is coiled between the bladder and sigmoid colon. There is a small amount of soft tissue stranding and emphysema within the subcutaneous fat surrounding the catheter within the anterior abdominal wall. No drainable fluid collection. Trace peritoneal fluid without focal loculation. Minimal pneumoperitoneum attributed to the recent surgery and/or peritoneal catheter. Musculoskeletal: No acute or significant osseous findings. IMPRESSION: 1. Small amount of soft tissue stranding and emphysema within the subcutaneous fat surrounding the peritoneal dialysis  catheter within the anterior abdominal wall. No drainable fluid collection. 2. Cholelithiasis without evidence of cholecystitis or biliary dilatation. 3. Mild dependent atelectasis at both lung bases. 4. Aortic Atherosclerosis (ICD10-I70.0). Electronically Signed   By: Richardean Sale M.D.   On: 09/13/2022 18:37    Medications:   atorvastatin  40 mg Oral Daily   Chlorhexidine Gluconate Cloth  6 each Topical Q0600   diclofenac Sodium  2 g Topical TID AC & HS   gabapentin  200 mg Oral BID   heparin  5,000 Units Subcutaneous Q8H   hydrALAZINE  50 mg Oral Q8H   insulin aspart  0-6 Units Subcutaneous TID WC   insulin NPH Human  8 Units Subcutaneous BID WC   metoCLOPramide (REGLAN) injection  5 mg Intravenous Q8H   Or   metoCLOPramide  5 mg Oral Q8H   metoprolol tartrate  75 mg Oral BID   pantoprazole  40 mg Oral Daily   PARoxetine  20 mg Oral QHS   prochlorperazine  10 mg Intravenous Once   torsemide  40 mg Oral Daily    Dialysis Orders: TCU [facility] - MTuThF 3hr, 400/A1.5, EDW 90kg, 2K/2Ca, TDC R chest, no heparin - Calcitriol 0.37mcg PO q HD - Mircera 160mcg IV q 2 weeks (last given 140mcg on 11/2)   Assessment/Plan:  PD catheter dysfunction: Recent revision/lysis of adhesions on 11/15 by Dr. Stanford Breed. tPA instilled at HD unit 11/20 after issues flushing cath - plan was to dwell x 2hr at home then return, but  he noticed "leaking" of fluid and so presented to ED. PD cath unable to be flushed here as well - unable to remove tPA from line or collect PD fluid studies. However, he has no abdominal tenderness with exam, so unlikely to have peritonitis anyways. VVS consulted, plan is for outpatient f/u to address the PD cath. We talked about status - PD prob not the best bet for him anyway given gastroparesis + DM. Will talk to his wife about consideration of home HD and continue to address as outpatient. Leukocytosis: New this AM - may need Blood Cx considering tunneled HD line. Abd CT without source  of infection. Vomiting/gastroparesis: S/p multiple IV meds here + home metoclopramide.  ESRD:  Usual MTuThF schedule as outpatient - HD 11/20, 11/21 - next HD on Friday 11/24.  Hypertension/volume: BP sky high initially, came down with nitro drip. Stopped then had to be restarted b/c he was vomiting his PO meds and BP up again - see how goes today.   Anemia: Hgb 10.2 - Aranesp 46mcg ordered for tonight.  Metabolic bone disease: Ca ok, Phos pending. Will add VDRA/binders once tolerating PO.  T2DM: Uncontrolled, per primary.  Veneta Penton, PA-C 09/15/2022, 9:35 AM  [employer]

## 2022-09-16 DIAGNOSIS — I10 Essential (primary) hypertension: Secondary | ICD-10-CM | POA: Diagnosis not present

## 2022-09-16 DIAGNOSIS — D72829 Elevated white blood cell count, unspecified: Secondary | ICD-10-CM | POA: Diagnosis not present

## 2022-09-16 LAB — RENAL FUNCTION PANEL
Albumin: 3.2 g/dL — ABNORMAL LOW (ref 3.5–5.0)
Anion gap: 15 (ref 5–15)
BUN: 40 mg/dL — ABNORMAL HIGH (ref 6–20)
CO2: 25 mmol/L (ref 22–32)
Calcium: 8.6 mg/dL — ABNORMAL LOW (ref 8.9–10.3)
Chloride: 93 mmol/L — ABNORMAL LOW (ref 98–111)
Creatinine, Ser: 4.62 mg/dL — ABNORMAL HIGH (ref 0.61–1.24)
GFR, Estimated: 15 mL/min — ABNORMAL LOW (ref >60)
Glucose, Bld: 150 mg/dL — ABNORMAL HIGH (ref 70–99)
Phosphorus: 4.2 mg/dL (ref 2.5–4.6)
Potassium: 3.7 mmol/L (ref 3.5–5.1)
Sodium: 133 mmol/L — ABNORMAL LOW (ref 135–145)

## 2022-09-16 LAB — GLUCOSE, CAPILLARY
Glucose-Capillary: 140 mg/dL — ABNORMAL HIGH (ref 70–99)
Glucose-Capillary: 232 mg/dL — ABNORMAL HIGH (ref 70–99)
Glucose-Capillary: 189 mg/dL — ABNORMAL HIGH (ref 70–99)
Glucose-Capillary: 167 mg/dL — ABNORMAL HIGH (ref 70–99)

## 2022-09-16 LAB — CBC
HCT: 26.9 % — ABNORMAL LOW (ref 39.0–52.0)
Hemoglobin: 9.4 g/dL — ABNORMAL LOW (ref 13.0–17.0)
MCH: 30.3 pg (ref 26.0–34.0)
MCHC: 34.9 g/dL (ref 30.0–36.0)
MCV: 86.8 fL (ref 80.0–100.0)
Platelets: 227 10*3/uL (ref 150–400)
RBC: 3.1 MIL/uL — ABNORMAL LOW (ref 4.22–5.81)
RDW: 13.2 % (ref 11.5–15.5)
WBC: 19.7 10*3/uL — ABNORMAL HIGH (ref 4.0–10.5)
nRBC: 0 % (ref 0.0–0.2)

## 2022-09-16 LAB — HEPATITIS B SURFACE ANTIBODY, QUANTITATIVE: Hep B S AB Quant (Post): <3.1 m[IU]/mL — ABNORMAL LOW (ref >9.9)

## 2022-09-16 MED ORDER — CHLORHEXIDINE GLUCONATE CLOTH 2 % EX PADS
6.0000 | MEDICATED_PAD | Freq: Every day | CUTANEOUS | Status: DC
  Administered 2022-09-16 – 2022-09-17 (×2): 6 via TOPICAL

## 2022-09-16 MED ORDER — TRAZODONE HCL 100 MG PO TABS
100.0000 mg | ORAL_TABLET | Freq: Every evening | ORAL | Status: DC | PRN
  Administered 2022-09-16: 100 mg via ORAL
  Filled 2022-09-16: qty 1

## 2022-09-16 NOTE — Plan of Care (Signed)

## 2022-09-16 NOTE — Progress Notes (Signed)
Daily Progress Note Intern Pager: [phone]  Patient name: Jose [name] Medical record number: [id number] Date of birth: 29-Apr-1976 Age: 46 y.o. Gender: male  Primary Care Provider: Roddie Mc, NP Consultants: nephrology, vascular surgery  Code Status: Full  Pt Overview and Major Events to Date:  11/20: Admitted   Assessment and Plan: Jose [name] is a 46 y.o. male presenting with drainage at PD site and uncontrolled hypertension due to missed medications.  PMH includes hypertension, ESRD on HD M/T/TH/F (transitioning to PD), type 2 diabetes, osteomyelitis, BPH, constipation, peripheral neuropathy, macular degeneration, hyperlipidemia, GAD.    * Poorly-controlled hypertension in the setting of missed dialysis BP improved with intermittent episodes of hypertension, this morning BP up to high 509T systolic. Initially on nitro drip which is now off.  -continue home hydralazine tid -continue torsemide 40 mg daily -continue to monitor BP -continue cardiac telemetry -consider IV antihypertensives if systolic above 267T  QT prolongation Recent QT 471. Awaiting am EKG.  - continue to hold remeron and trazodone  - replete K and Mg as needed - daily EKG until resolved - limit QT prolonging agents as able  - continue cardiac telemetry   Leukocytosis WBC within normal limits upon admission and then yesterday increased to 17, today slightly further elevated at 19. Reassuringly patient remains afebrile without signs of systemic infection. Not on steroids or medications to worsen. Will continue to monitor. Possible source includes dialysis catheter as drainage was noted upon admission. -continue to monitor closely clinically -if febrile, consider CXR, blood cultures and antibiotic initiation -am CBC  ESRD (end stage renal disease) on dialysis [facility]) Hemodialysis schedule M/T/TH/F.  Oliguric. PD catheter with drainage. Vascular surgery will continue to evaluate PD catheter. Not  concerning for infection to them. Continue to use HD catheter.  - Nephrology following, appreciate recs - Plan for hemodialysis during hospital stay -  monitor RFP  - avoid nephrotoxic agents   Type 2 diabetes mellitus with chronic kidney disease, with long-term current use of insulin (HCC) A1c 05/2022 was 7.7.  Takes 12 units of insulin BID, uses 70/30 mix. CBGs appropriate, no hypoglycemic episodes.  - continue NPH 8 units BID and SSI  - monitor CBGs  Nausea & vomiting Improved since yesterday.  - continue scheduled reglan - continue prn compazine  - continue to monitor     FEN/GI: renal diet  PPx: heparin  Dispo:Pending PT recommendations  pending clinical improvement .   Subjective:  No acute overnight events reported. Patient denies chest pain, dyspnea or leg swelling.   Objective: Temp:  [98.9 F (37.2 C)-99.3 F (37.4 C)] 98.9 F (37.2 C) (11/23 0525) Pulse Rate:  [78-82] 78 (11/23 0525) Resp:  [16-18] 16 (11/23 0525) BP: (117-146)/(67-76) 117/67 (11/23 0525) SpO2:  [93 %-96 %] 95 % (11/23 0525) Physical Exam: General: Patient resting comfortably, in no acute distress. Cardiovascular: RRR, no murmurs or gallops auscultated  Respiratory: CTAB, no wheezing, rales or rhonchi noted Abdomen: soft, nontender, nondistended, presence of bowel sounds Extremities: no LE edema noted bilaterally, distal pulses strong and equal bilaterally Psych: mood appropriate, pleasant    Laboratory: Most recent CBC Lab Results  Component Value Date   WBC 19.7 (H) 09/16/2022   HGB 9.4 (L) 09/16/2022   HCT 26.9 (L) 09/16/2022   MCV 86.8 09/16/2022   PLT 227 09/16/2022   Most recent BMP    Latest Ref Rng & Units 09/16/2022    1:24 AM  BMP  Glucose 70 - 99  mg/dL 150   BUN 6 - 20 mg/dL 40   Creatinine 0.61 - 1.24 mg/dL 4.62   Sodium 135 - 145 mmol/L 133   Potassium 3.5 - 5.1 mmol/L 3.7   Chloride 98 - 111 mmol/L 93   CO2 22 - 32 mmol/L 25   Calcium 8.9 - 10.3 mg/dL 8.6        Imaging/Diagnostic Tests: DG CHEST PORT 1 VIEW  Result Date: 09/15/2022 CLINICAL DATA:  Leukocytosis EXAM: PORTABLE CHEST 1 VIEW COMPARISON:  Radiograph 08/19/2022 FINDINGS: Right neck catheter tip overlies the right atrium. Unchanged cardiomediastinal silhouette. There is no focal airspace consolidation. There is no large effusion or pneumothorax. Right costophrenic angle is excluded by collimation. IMPRESSION: No evidence of acute cardiopulmonary disease. Electronically Signed   By: Maurine Simmering M.D.   On: 09/15/2022 12:36     Donney Dice, DO 09/16/2022, 10:07 AM  PGY-3, [place] Intern pager: [phone], text pages welcome Secure chat group [place]

## 2022-09-16 NOTE — Assessment & Plan Note (Addendum)
Resolved. WBC back down to 12.7. Unsure cause of leukocytosis. PD catheter site not likely source of infection. Patient remained afebrile.

## 2022-09-16 NOTE — Progress Notes (Signed)
    Subjective  -   No acute overnight events   Physical Exam:  Abdomen is soft and nontender.  Slight fullness around the periumbilical incision without erythema       Assessment/Plan:    ESRD: The patient has had a good run of his peritoneal dialysis catheter initially, however currently it is nonfunctioning.  Hopefully this is just a positional thing and will improve with time.  I would continue to try to flush the catheter and see if we can get it to work.  I did discuss with the patient that if this does not resolve itself, he will need to have the catheter removed.  Wells Deklyn Trachtenberg 09/16/2022 8:18 AM --  Vitals:   09/15/22 1955 09/16/22 0525  BP: 137/72 117/67  Pulse: 80 78  Resp: 18 16  Temp: 99.3 F (37.4 C) 98.9 F (37.2 C)  SpO2: 93% 95%    Intake/Output Summary (Last 24 hours) at 09/16/2022 0818 Last data filed at 09/15/2022 1600 Gross per 24 hour  Intake 151.61 ml  Output --  Net 151.61 ml     Laboratory CBC    Component Value Date/Time   WBC 19.7 (H) 09/16/2022 0124   HGB 9.4 (L) 09/16/2022 0124   HGB 13.3 05/05/2018 1211   HCT 26.9 (L) 09/16/2022 0124   HCT 40.2 05/05/2018 1211   PLT 227 09/16/2022 0124   PLT 272 05/05/2018 1211    BMET    Component Value Date/Time   NA 133 (L) 09/16/2022 0124   NA 141 11/22/2019 1012   K 3.7 09/16/2022 0124   CL 93 (L) 09/16/2022 0124   CO2 25 09/16/2022 0124   GLUCOSE 150 (H) 09/16/2022 0124   BUN 40 (H) 09/16/2022 0124   BUN 20 11/22/2019 1012   CREATININE 4.62 (H) 09/16/2022 0124   CREATININE 2.48 (H) 12/18/2021 1407   CALCIUM 8.6 (L) 09/16/2022 0124   GFRNONAA 15 (L) 09/16/2022 0124   GFRAA 82 11/22/2019 1012    COAG Lab Results  Component Value Date   INR 1.2 08/19/2022   INR 1.1 06/27/2022   INR 1.1 06/02/2022   No results found for: "PTT"  Antibiotics Anti-infectives (From admission, onward)    None        V. Leia Alf, M.D., [facility]  [place] Office: [phone] Pager:  [phone]

## 2022-09-16 NOTE — Hospital Course (Addendum)
Jose [name] is a 46 y.o. male presenting with drainage at hemodialysis site and was found to have hypertensive emergency (systolics in 323F and headache).  PMH includes hypertension, ESRD on HD M/T/TH/F (transitioning to PD), type 2 diabetes, osteomyelitis, BPH, constipation, peripheral neuropathy, macular degeneration, hyperlipidemia, GAD.  His hospital course is outlined below:   Hypertension in the setting of vomiting:  Patient presented with severe tension-like headache and on admission his systolic blood pressure was 200-220s.  Patient went to dialysis and received dialysis through HD instead of PD due to missed functioning PD.  He had lots of nausea and vomiting which is normal for patient as he has severe gastroparesis and is on scheduled Reglan at home.  However he had nausea and vomiting despite this and was not able to tolerate his home p.o. hypertension medications.  Neurological exam was normal on admission.  Patient was started on a Cardene drip in the ED and transition to a nitro drip. CT head was negative for acute abnormalities. His symptoms resolved and there were no signs of endorgan damage.  Nitro drip was discontinued and he was started on his home medications of hydralazine, metoprolol, and torsemide.  His blood pressure remained stable on his home medications.  At discharge his home medications were continued and his blood pressure was stable.  ESRD on dialysis: Patient recently got PD cath placed and adjusted on 11/15.  Patient presented due to draining around his PD catheter.  CT abdomen pelvis showed small amount of soft tissue stranding and emphysema around the PD cath consistent with recent placement, without drainable fluid collection or abscess. Hemodialysis was started inpatient due to a nonfunctioning peritoneal dialysis catheter.  Vascular surgery was consulted to assess his peritoneal catheter. They did not see signs of infection at his PD catheter site; however, PD  catheter would not flush. It will be removed outpatient.  Patient received hemodialysis on 11/21 and 11/24 while admitted.  Prolonged QTC  Patient had prolonged QTc during admission.  Most likely secondary to hypokalemia and Reglan use in the setting of vomiting. Managed using daily EKGs and holding QTc prolonging medications.  Repleted electrolytes as needed.  Reglan was held for the first day however was continued on the second day of admission due to history of gastroparesis and at that exacerbating his hypertension as he was not able to tolerate his hypertensive medications.  As QTc prolongation resolved we restarted his other home medications including Paxil, trazodone.  QTc was 425 on day of discharge.

## 2022-09-16 NOTE — Progress Notes (Signed)
[facility] Progress Note   Subjective:   Reports vascular MD told him to keep trying to flush PD to see if it will start working, he is hopeful. Otherwise is considering home hemo. Feels well today, no nausea, abdominal pain, SOB, CP, or dizziness.   Objective Vitals:   09/15/22 0834 09/15/22 1159 09/15/22 1955 09/16/22 0525  BP: (!) 141/80 (!) 146/76 137/72 117/67  Pulse: 84 82 80 78  Resp: 17 18 18 16   Temp: 98.9 F (37.2 C) 99.1 F (37.3 C) 99.3 F (37.4 C) 98.9 F (37.2 C)  TempSrc: Oral Oral Oral Oral  SpO2: 95% 96% 93% 95%  Weight:      Height:       Physical Exam General: Alert male in NAD Heart: RRR, no murmurs, rubs or gallops  Lungs: CTA bilaterally, no wheezing, rhonchi or rales Abdomen: Soft, non-distended, non-tender, no pain surrounding PD cath Extremities: No edema b/l lower extremities Dialysis Access:  [facility] in R chest  Additional Objective Labs: Basic Metabolic Panel: Recent Labs  Lab 09/14/22 0100 09/15/22 0338 09/16/22 0124  NA 135 136 133*  K 3.8 4.0 3.7  CL 96* 91* 93*  CO2 28 25 25   GLUCOSE 196* 223* 150*  BUN 17 20 40*  CREATININE 2.67* 2.91* 4.62*  CALCIUM 8.1* 8.7* 8.6*  PHOS  --   --  4.2   Liver Function Tests: Recent Labs  Lab 09/13/22 1639 09/14/22 0100 09/15/22 0338 09/16/22 0124  AST 25 20 15   --   ALT 16 13 13   --   ALKPHOS 155* 157* 165*  --   BILITOT 0.5 0.7 1.0  --   PROT 6.3* 6.1* 6.6  --   ALBUMIN 3.3* 3.1* 3.5 3.2*   No results for input(s): "LIPASE", "AMYLASE" in the last 168 hours. CBC: Recent Labs  Lab 09/13/22 1639 09/14/22 0100 09/15/22 0338 09/15/22 1501 09/16/22 0124  WBC 8.0 8.3 17.3* 19.2* 19.7*  NEUTROABS 4.9  --   --  15.3*  --   HGB 9.2* 9.2* 10.2* 10.2* 9.4*  HCT 26.5* 26.0* 28.9* 29.7* 26.9*  MCV 86.6 86.1 86.3 86.8 86.8  PLT 208 216 244 262 227   Blood Culture    Component Value Date/Time   SDES IN/OUT CATH URINE 08/19/2022 2010   [place] NONE 08/19/2022 2010    CULT  08/19/2022 2010    NO GROWTH Performed at [facility] [address] [place], [place] [address]    REPTSTATUS 08/20/2022 FINAL 08/19/2022 2010    Cardiac Enzymes: No results for input(s): "CKTOTAL", "CKMB", "CKMBINDEX", "TROPONINI" in the last 168 hours. CBG: Recent Labs  Lab 09/15/22 1147 09/15/22 1629 09/15/22 2052 09/15/22 2129 09/16/22 0800  GLUCAP 219* 179* 110* 149* 140*   Iron Studies: No results for input(s): "IRON", "TIBC", "TRANSFERRIN", "FERRITIN" in the last 72 hours. @lablastinr3 @ Studies/Results: DG CHEST PORT 1 VIEW  Result Date: 09/15/2022 CLINICAL DATA:  Leukocytosis EXAM: PORTABLE CHEST 1 VIEW COMPARISON:  Radiograph 08/19/2022 FINDINGS: Right neck catheter tip overlies the right atrium. Unchanged cardiomediastinal silhouette. There is no focal airspace consolidation. There is no large effusion or pneumothorax. Right costophrenic angle is excluded by collimation. IMPRESSION: No evidence of acute cardiopulmonary disease. Electronically Signed   By: Maurine Simmering M.D.   On: 09/15/2022 12:36   Medications:   atorvastatin  40 mg Oral Daily   Chlorhexidine Gluconate Cloth  6 each Topical Q0600   darbepoetin (ARANESP) injection - NON-DIALYSIS  60 mcg Subcutaneous Q Wed-1800  diclofenac Sodium  2 g Topical TID AC & HS   gabapentin  200 mg Oral BID   heparin  5,000 Units Subcutaneous Q8H   hydrALAZINE  50 mg Oral Q8H   insulin aspart  0-6 Units Subcutaneous TID WC   insulin NPH Human  8 Units Subcutaneous BID WC   metoCLOPramide (REGLAN) injection  5 mg Intravenous TID with meals   Or   metoCLOPramide  5 mg Oral TID with meals   metoprolol tartrate  75 mg Oral BID   pantoprazole  40 mg Oral Daily   PARoxetine  20 mg Oral QHS   torsemide  40 mg Oral Daily    Dialysis Orders: [facility]CU [facility] - MTuThF 3hr, 400/A1.5, EDW 90kg, 2K/2Ca, TDC R chest, no heparin - Calcitriol 0.42mcg PO q HD - Mircera 17mcg IV q 2 weeks (last given 167mcg on  11/2)  Assessment/Plan:  PD catheter dysfunction: Recent revision/lysis of adhesions on 11/15 by Dr. Stanford Breed. tPA instilled at HD unit 11/20 after issues flushing cath - plan was to dwell x 2hr at home then return, but he noticed "leaking" of fluid and so presented to ED. PD cath unable to be flushed here as well - unable to remove tPA from line or collect PD fluid studies. However, he has no abdominal tenderness with exam, so unlikely to have peritonitis anyways. VVS consulted, if PD cath fails to flush will need to have it removed outpatient. He is considering home HD if PD does not work out Leukocytosis: New yesterday- afebrile, feels well, blood cultures drawn yesterday and still pending. Abd CT without source of infection. Vomiting/gastroparesis: S/p multiple IV meds here + home metoclopramide. Much better today  ESRD:  Usual MTuThF schedule as outpatient - HD 11/20, 11/21 - next HD on Friday 11/24.  Hypertension/volume: BP high initially, came down with nitro drip. Stopped then had to be restarted b/c he was vomiting his PO meds. Much better past 24 hours  Anemia: Hgb 9.4 - Aranesp 70YFV weekly  Metabolic bone disease: Ca ok, Phos at goal. Will add VDRA/binders once tolerating PO.  T2DM: Uncontrolled, per primary.    Anice Paganini, PA-C 09/16/2022, 8:29 AM  [facility] Pager: [phone]

## 2022-09-17 DIAGNOSIS — I10 Essential (primary) hypertension: Secondary | ICD-10-CM | POA: Diagnosis not present

## 2022-09-17 LAB — RENAL FUNCTION PANEL
Albumin: 2.8 g/dL — ABNORMAL LOW (ref 3.5–5.0)
Anion gap: 20 — ABNORMAL HIGH (ref 5–15)
BUN: 55 mg/dL — ABNORMAL HIGH (ref 6–20)
CO2: 22 mmol/L (ref 22–32)
Calcium: 8.3 mg/dL — ABNORMAL LOW (ref 8.9–10.3)
Chloride: 95 mmol/L — ABNORMAL LOW (ref 98–111)
Creatinine, Ser: 5.66 mg/dL — ABNORMAL HIGH (ref 0.61–1.24)
GFR, Estimated: 12 mL/min — ABNORMAL LOW (ref >60)
Glucose, Bld: 138 mg/dL — ABNORMAL HIGH (ref 70–99)
Phosphorus: 4.8 mg/dL — ABNORMAL HIGH (ref 2.5–4.6)
Potassium: 3.8 mmol/L (ref 3.5–5.1)
Sodium: 137 mmol/L (ref 135–145)

## 2022-09-17 LAB — CBC
HCT: 26.3 % — ABNORMAL LOW (ref 39.0–52.0)
Hemoglobin: 8.8 g/dL — ABNORMAL LOW (ref 13.0–17.0)
MCH: 30.7 pg (ref 26.0–34.0)
MCHC: 33.5 g/dL (ref 30.0–36.0)
MCV: 91.6 fL (ref 80.0–100.0)
Platelets: 195 10*3/uL (ref 150–400)
RBC: 2.87 MIL/uL — ABNORMAL LOW (ref 4.22–5.81)
RDW: 13.1 % (ref 11.5–15.5)
WBC: 12.7 10*3/uL — ABNORMAL HIGH (ref 4.0–10.5)
nRBC: 0 % (ref 0.0–0.2)

## 2022-09-17 LAB — GLUCOSE, CAPILLARY
Glucose-Capillary: 139 mg/dL — ABNORMAL HIGH (ref 70–99)
Glucose-Capillary: 169 mg/dL — ABNORMAL HIGH (ref 70–99)

## 2022-09-17 MED ORDER — HEPARIN SODIUM (PORCINE) 1000 UNIT/ML DIALYSIS: 1000.0000 [IU] | INTRAMUSCULAR | Status: DC | PRN

## 2022-09-17 MED ORDER — PROCHLORPERAZINE MALEATE 5 MG PO TABS: 5.0000 mg | ORAL_TABLET | Freq: Three times a day (TID) | ORAL | 0 refills | Status: DC | PRN

## 2022-09-17 MED ORDER — ANTICOAGULANT SODIUM CITRATE 4% (200MG/5ML) IV SOLN: 5.0000 mL | Status: DC | PRN

## 2022-09-17 MED ORDER — CALCITRIOL 0.25 MCG PO CAPS
0.2500 ug | ORAL_CAPSULE | ORAL | Status: DC
  Administered 2022-09-17: 0.25 ug via ORAL
  Filled 2022-09-17: qty 1

## 2022-09-17 MED ORDER — HEPARIN SODIUM (PORCINE) 1000 UNIT/ML IJ SOLN
INTRAMUSCULAR | Status: AC
  Administered 2022-09-17: 1000 [IU]
  Filled 2022-09-17: qty 4

## 2022-09-17 MED ORDER — ALTEPLASE 2 MG IJ SOLR: 2.0000 mg | Freq: Once | INTRAMUSCULAR | Status: DC | PRN

## 2022-09-17 NOTE — Progress Notes (Signed)
Received patient in bed to unit.  Alert and oriented.  Informed consent signed and in chart.    Patient tolerated well.  Transported back to the room  Alert, without acute distress.  Hand-off given to patient's nurse.   Access used: [facility] Access issues: none  Total UF removed: 1.5L Medication(s) given: none    [name]

## 2022-09-17 NOTE — Discharge Summary (Addendum)
Jose [name] Medical record number: [id number] Date of birth: 06-Feb-1976 Age: 46 y.o. Gender: male Date of Admission: 09/13/2022  Date of Discharge: 09/17/2022 Admitting Physician: Alesia Morin, MD  Primary Care Provider: Roddie Mc, NP Consultants: Nephrology, vascular surgery  Indication for Hospitalization: Uncontrolled hypertension  Discharge Diagnoses/Problem List:  Principal Problem for Admission: Poorly controlled hypertension Other Problems addressed during stay:  Active Problems:   Poorly-controlled hypertension in the setting N/V inability to take HTN meds   Type 2 diabetes mellitus with chronic kidney disease, with long-term current use of insulin (HCC)   ESRD (end stage renal disease) on dialysis (HCC)   Nausea & vomiting  Brief Hospital Course:  Jose [name] is a 46 y.o. male presenting with drainage at hemodialysis site and was found to have hypertensive emergency (systolics in 106Y and headache).  PMH includes hypertension, ESRD on HD M/T/TH/F (transitioning to PD), type 2 diabetes, osteomyelitis, BPH, constipation, peripheral neuropathy, macular degeneration, hyperlipidemia, GAD.  His hospital course is outlined below:   Hypertension in the setting of vomiting:  Patient presented with severe tension-like headache and on admission his systolic blood pressure was 200-220s.  Patient went to dialysis and received dialysis through HD instead of PD due to missed functioning PD.  He had lots of nausea and vomiting which is normal for patient as he has severe gastroparesis and is on scheduled Reglan at home.  However he had nausea and vomiting despite this and was not able to tolerate his home p.o. hypertension medications.  Neurological exam was normal on admission.  Patient was started on a Cardene drip in the ED and transition to a nitro drip. CT head was negative for acute abnormalities.  His symptoms resolved and there were no signs of endorgan damage.  Nitro drip was discontinued and he was started on his home medications of hydralazine, metoprolol, and torsemide.  His blood pressure remained stable on his home medications.  At discharge his home medications were continued and his blood pressure was stable.  ESRD on dialysis: Patient recently got PD cath placed and adjusted on 11/15.  Patient presented due to draining around his PD catheter.  CT abdomen pelvis showed small amount of soft tissue stranding and emphysema around the PD cath consistent with recent placement, without drainable fluid collection or abscess. Hemodialysis was started inpatient due to a nonfunctioning peritoneal dialysis catheter.  Vascular surgery was consulted to assess his peritoneal catheter. They did not see signs of infection at his PD catheter site; however, PD catheter would not flush. It will be removed outpatient.  Patient received hemodialysis on 11/21 and 11/24 while admitted.  Prolonged QTC  Patient had prolonged QTc during admission.  Most likely secondary to hypokalemia and Reglan use in the setting of vomiting. Managed using daily EKGs and holding QTc prolonging medications.  Repleted electrolytes as needed.  Reglan was held for the first day however was continued on the second day of admission due to history of gastroparesis and at that exacerbating his hypertension as he was not able to tolerate his hypertensive medications.  As QTc prolongation resolved we restarted his other home medications including Paxil, trazodone.  QTc was 425 on day of discharge.  Disposition: Home  Discharge Condition: Stable  PCP Follow Up:  Peritoneal dialysis catheter to be removed outpatient as it could not be flushed during hospitalization despite attempts by vascular surgery and nephrology.  Nausea regimen, Reglan is scheduled  but patient requires compazine occasionally as well. Prescribed few tablets of 5 mg prn  compazine as well on discharge. Please consider continuing if needed.    Discharge Exam:  Vitals:   09/17/22 0520 09/17/22 0724  BP: (!) 163/82 (!) 147/76  Pulse: 72 72  Resp: 16 17  Temp: 98.4 F (36.9 C) 98.8 F (37.1 C)  SpO2:  94%   Physical Exam: General: Well-appearing sitting in bed, not ill-appearing Cardiovascular: Regular rate and rhythm, pulses equal and palpable, cap refill within normal limits Respiratory: No increased work of breathing on room air Abdomen: Soft, nontender, nondistended Extremities: No peripheral edema  Significant Procedures: Hemodialysis on 11/21, 11/24  Significant Labs and Imaging:  Recent Labs  Lab 09/15/22 1501 09/16/22 0124 09/17/22 0145  WBC 19.2* 19.7* 12.7*  HGB 10.2* 9.4* 8.8*  HCT 29.7* 26.9* 26.3*  PLT 262 227 195   Recent Labs  Lab 09/16/22 0124 09/17/22 0145  NA 133* 137  K 3.7 3.8  CL 93* 95*  CO2 25 22  GLUCOSE 150* 138*  BUN 40* 55*  CREATININE 4.62* 5.66*  CALCIUM 8.6* 8.3*  PHOS 4.2 4.8*  ALBUMIN 3.2* 2.8*   CT Abdomen Pelvis 11/20 IMPRESSION: 1. Small amount of soft tissue stranding and emphysema within the subcutaneous fat surrounding the peritoneal dialysis catheter within the anterior abdominal wall. No drainable fluid collection. 2. Cholelithiasis without evidence of cholecystitis or biliary dilatation. 3. Mild dependent atelectasis at both lung bases. 4. Aortic Atherosclerosis (ICD10-I70.0).   CT Head 11/20 IMPRESSION: No acute intracranial pathology.  Chest Xray 09/15/2022 IMPRESSION: No evidence of acute cardiopulmonary disease.  Results/Tests Pending at Time of Discharge: None  Discharge Medications:  Allergies as of 09/17/2022       Reactions   Cranberry Itching   Hm Lidocaine Patch [lidocaine] Dermatitis   Blisters skin    Melatonin Other (See Comments)   nightmares   Nsaids Other (See Comments)   Stage 3 kidney disease   Trulicity [dulaglutide] Other (See Comments)   Night  sweats  Uncontrolled tempeture         Medication List     TAKE these medications    acetaminophen 500 MG tablet Commonly known as: TYLENOL Take 500-1,000 mg by mouth every 6 (six) hours as needed for mild pain.   albuterol 108 (90 Base) MCG/ACT inhaler Commonly known as: VENTOLIN HFA Inhale 1 puff into the lungs every 4 (four) hours as needed for wheezing or shortness of breath.   ALPRAZolam 0.25 MG tablet Commonly known as: XANAX Take 0.25 mg by mouth 2 (two) times daily as needed for anxiety.   atorvastatin 40 MG tablet Commonly known as: LIPITOR Take 40 mg by mouth daily.   cholecalciferol 25 MCG (1000 UNIT) tablet Commonly known as: VITAMIN D3 Take 1,000 Units by mouth daily.   FreeStyle Libre 3 Sensor Misc 1 Device by Does not apply route every 14 (fourteen) days. Apply 1 sensor on upper arm every 14 days for continuous glucose monitoring   gabapentin 100 MG capsule Commonly known as: NEURONTIN Take 2 capsules (200 mg total) by mouth 2 (two) times daily.   hydrALAZINE 50 MG tablet Commonly known as: APRESOLINE Take 1 tablet (50 mg total) by mouth every 8 (eight) hours. What changed: when to take this   hydrOXYzine 25 MG tablet Commonly known as: ATARAX TAKE 1 TABLET(25 MG) BY MOUTH THREE TIMES DAILY AS NEEDED FOR ANXIETY OR NAUSEA What changed: See the new instructions.   insulin NPH-regular Human (  70-30) 100 UNIT/ML injection Inject 8-12 Units into the skin See admin instructions. Taking 10 units in the Am and 14 units at night   iron polysaccharides 150 MG capsule Commonly known as: NIFEREX Take 1 capsule (150 mg total) by mouth daily.   metoCLOPramide 5 MG tablet Commonly known as: Reglan Take 1 tablet (5 mg total) by mouth every 8 (eight) hours as needed for nausea.   Metoprolol Tartrate 75 MG Tabs Take 75 mg by mouth 2 (two) times daily.   mirtazapine 15 MG tablet Commonly known as: REMERON Take 1 tablet (15 mg total) by mouth at bedtime.    oxyCODONE 5 MG immediate release tablet Commonly known as: Roxicodone Take 1 tablet (5 mg total) by mouth every 6 (six) hours as needed.   pantoprazole 40 MG tablet Commonly known as: PROTONIX Take 1 tablet (40 mg total) by mouth daily.   PARoxetine 20 MG tablet Commonly known as: PAXIL Take 1 tablet (20 mg total) by mouth at bedtime.   Pentips 32G X 4 MM Misc Generic drug: Insulin Pen Needle use as directed   polyethylene glycol 17 g packet Commonly known as: MIRALAX / GLYCOLAX Take 17 g by mouth daily as needed for moderate constipation, mild constipation or severe constipation.   prochlorperazine 5 MG tablet Commonly known as: COMPAZINE Take 1 tablet (5 mg total) by mouth every 8 (eight) hours as needed for nausea or vomiting. If having nausea despite Reglan   sevelamer carbonate 800 MG tablet Commonly known as: RENVELA Take 800 mg by mouth 3 (three) times daily with meals.   tamsulosin 0.4 MG Caps capsule Commonly known as: FLOMAX Take 1 capsule (0.4 mg total) by mouth daily after supper.   Torsemide 40 MG Tabs Take 40 mg by mouth daily.   traZODone 100 MG tablet Commonly known as: DESYREL Take 100 mg by mouth at bedtime as needed for sleep.        Discharge Instructions: Please refer to Patient Instructions section of EMR for full details.  Patient was counseled important signs and symptoms that should prompt return to medical care, changes in medications, dietary instructions, activity restrictions, and follow up appointments.   Follow-Up Appointments:  Follow-up Information     Pridgen, Garlon Hatchet, NP. Schedule an appointment as soon as possible for a visit.   Specialty: Family Medicine Why: Please make an appointment at your earliest convenience for a hospital follow up. Contact information: [place] [address] [facility].   Contact information: [place]  [address] [phone]                 Lowry Ram, MD 09/17/2022, 1:05 PM PGY-1, [facility]  Upper Level Addendum  I was personally present and performed or re-performed the history, physical exam and medical decision making activities of this service and have verified that the service and findings are accurately documented in Dr. Lois Huxley note.My edits are noted within the note above. Please also see attending's attestation.   Donney Dice, DO                  09/17/2022, 1:12 PM  PGY-3, [place]

## 2022-09-17 NOTE — Discharge Instructions (Signed)
Thank you for letting us take care of you!  You were admitted due to very high blood pressures.  Your blood pressures were in the 200s along with you having a headache.  This is most likely due to nausea and not being able to tolerate your by mouth blood pressure medications.  We treated you with IV nausea medications and a continuous IV drip for your blood pressure.  Thereafter we used IV blood pressure medications.  In addition we had vascular surgery and nephrology evaluate and treat your PD catheter that had drainage.  Evaluation showed that it was most likely not infected.  However not flush.  Thus, it will most likely have to be removed outpatient when you leave the hospital.  He also had abnormalities in your EKG called a prolonged QTc.  This is due to abnormalities in your electrolytes that we faxed while you are admitted.  These abnormalities can sometimes happen due to vomiting and/or your kidney disease.  We have been getting daily EKGs and your last EKG shows that this problem has resolved.  On discharge we represcribed your twice daily Reglan, but also prescribed an as needed medication called Compazine that you can take when Reglan is not enough.  We only gave you 2 to 3 days worth of pills for this, and your primary care physician will continue this if they feel like it is still necessary.  If you have any concerns please return to the emergency department.

## 2022-09-17 NOTE — Progress Notes (Signed)
[facility] Progress Note   Subjective:   Pt seen in room. Feeling much better today. Wife would like to speak to family medicine service, sent them a message. Denies fever, chills, SOB and nausea.  Unfortunately outpatient HD center cannot dialyze him this afternoon and first shift HD has already started here. He will be dialyzed ASAP on second shift (early afternoon).   Objective Vitals:   09/16/22 2117 09/16/22 2122 09/17/22 0520 09/17/22 0724  BP: (!) 148/76 (!) 148/76 (!) 163/82 (!) 147/76  Pulse: 79 79 72 72  Resp:  16 16 17   Temp:  98.8 F (37.1 C) 98.4 F (36.9 C) 98.8 F (37.1 C)  TempSrc:  Oral Oral Oral  SpO2:  95%  94%  Weight:      Height:       Physical Exam General: Alert, well appearing male in NAD Heart: RRR, no murmurs, rubs or gallops Lungs: CTA bilaterally without wheezing, rhonchi or rales Abdomen: Soft, non-tender, non-distended, +BS Extremities: No edema b/l lower extremities Dialysis Access:  PD cath in abdomen, [facility] R chest  Additional Objective Labs: Basic Metabolic Panel: Recent Labs  Lab 09/15/22 0338 09/16/22 0124 09/17/22 0145  NA 136 133* 137  K 4.0 3.7 3.8  CL 91* 93* 95*  CO2 25 25 22   GLUCOSE 223* 150* 138*  BUN 20 40* 55*  CREATININE 2.91* 4.62* 5.66*  CALCIUM 8.7* 8.6* 8.3*  PHOS  --  4.2 4.8*   Liver Function Tests: Recent Labs  Lab 09/13/22 1639 09/14/22 0100 09/15/22 0338 09/16/22 0124 09/17/22 0145  AST 25 20 15   --   --   ALT 16 13 13   --   --   ALKPHOS 155* 157* 165*  --   --   BILITOT 0.5 0.7 1.0  --   --   PROT 6.3* 6.1* 6.6  --   --   ALBUMIN 3.3* 3.1* 3.5 3.2* 2.8*   No results for input(s): "LIPASE", "AMYLASE" in the last 168 hours. CBC: Recent Labs  Lab 09/13/22 1639 09/14/22 0100 09/15/22 0338 09/15/22 1501 09/16/22 0124 09/17/22 0145  WBC 8.0 8.3 17.3* 19.2* 19.7* 12.7*  NEUTROABS 4.9  --   --  15.3*  --   --   HGB 9.2* 9.2* 10.2* 10.2* 9.4* 8.8*  HCT 26.5* 26.0* 28.9* 29.7*  26.9* 26.3*  MCV 86.6 86.1 86.3 86.8 86.8 91.6  PLT 208 216 244 262 227 195   Blood Culture    Component Value Date/Time   SDES BLOOD LEFT HAND 09/15/2022 1703   SPECREQUEST  09/15/2022 1703    BOTTLES DRAWN AEROBIC ONLY Blood Culture adequate volume   CULT  09/15/2022 1703    NO GROWTH 2 DAYS Performed at [facility], [place] [address] [place], [place] [address]    REPTSTATUS PENDING 09/15/2022 1703    Cardiac Enzymes: No results for input(s): "CKTOTAL", "CKMB", "CKMBINDEX", "TROPONINI" in the last 168 hours. CBG: Recent Labs  Lab 09/16/22 0800 09/16/22 1218 09/16/22 1620 09/16/22 2123 09/17/22 0729  GLUCAP 140* 232* 189* 167* 139*   Iron Studies: No results for input(s): "IRON", "TIBC", "TRANSFERRIN", "FERRITIN" in the last 72 hours. @lablastinr3 @ Studies/Results: DG CHEST PORT 1 VIEW  Result Date: 09/15/2022 CLINICAL DATA:  Leukocytosis EXAM: PORTABLE CHEST 1 VIEW COMPARISON:  Radiograph 08/19/2022 FINDINGS: Right neck catheter tip overlies the right atrium. Unchanged cardiomediastinal silhouette. There is no focal airspace consolidation. There is no large effusion or pneumothorax. Right costophrenic angle is excluded by collimation.  IMPRESSION: No evidence of acute cardiopulmonary disease. Electronically Signed   By: Maurine Simmering M.D.   On: 09/15/2022 12:36   Medications:   atorvastatin  40 mg Oral Daily   Chlorhexidine Gluconate Cloth  6 each Topical Q0600   Chlorhexidine Gluconate Cloth  6 each Topical Q0600   darbepoetin (ARANESP) injection - NON-DIALYSIS  60 mcg Subcutaneous Q Wed-1800   diclofenac Sodium  2 g Topical TID AC & HS   gabapentin  200 mg Oral BID   heparin  5,000 Units Subcutaneous Q8H   hydrALAZINE  50 mg Oral Q8H   insulin aspart  0-6 Units Subcutaneous TID WC   insulin NPH Human  8 Units Subcutaneous BID WC   metoCLOPramide (REGLAN) injection  5 mg Intravenous TID with meals   Or   metoCLOPramide  5 mg Oral TID with meals   metoprolol  tartrate  75 mg Oral BID   pantoprazole  40 mg Oral Daily   PARoxetine  20 mg Oral QHS   torsemide  40 mg Oral Daily    Dialysis Orders: TCU [facility] - MTuThF 3hr, 400/A1.5, EDW 90kg, 2K/2Ca, TDC R chest, no heparin - Calcitriol 0.74mcg PO q HD - Mircera 191mcg IV q 2 weeks (last given 162mcg on 11/2)  Assessment/Plan:  PD catheter dysfunction: Recent revision/lysis of adhesions on 11/15 by Dr. Stanford Breed. tPA instilled at HD unit 11/20 after issues flushing cath - plan was to dwell x 2hr at home then return, but he noticed "leaking" of fluid and so presented to ED. PD cath unable to be flushed here as well - unable to remove tPA from line or collect PD fluid studies. However, he has no abdominal tenderness with exam, so unlikely to have peritonitis. VVS consulted, if PD cath fails to flush will need to have it removed outpatient. He is considering home HD if PD does not work out Leukocytosis: Improved. afebrile, feels well, blood cultures no growth to date. Abd CT without source of infection. Vomiting/gastroparesis: S/p multiple IV meds here + home metoclopramide. Much better today  ESRD:  Usual MTuThF schedule as outpatient. HD here today (on schedule for early afternoon)  Hypertension/volume: BP high initially, came down with nitro drip. Stopped then had to be restarted b/c he was vomiting his PO meds. Much better past 24 hours  Anemia: Hgb 8.8 - Aranesp 62IRS weekly  Metabolic bone disease: Ca ok, Phos at goal. Ordered VDRA. Continue home binders at d/c  T2DM: Uncontrolled, per primary.    Anice Paganini, PA-C 09/17/2022, 8:21 AM  [facility] Pager: [phone]

## 2022-09-17 NOTE — Progress Notes (Signed)
Daily Progress Note Intern Pager: [phone]  Patient name: Jose [name] Medical record number: [id number] Date of birth: [birth year] Age: 46 y.o. Gender: male  Primary Care Provider: Roddie Mc, NP Consultants: Vascular, Nephrology Code Status: Full   Pt Overview and Major Events to Date:  11/20: Admitted    Assessment and Plan: Jose [name] is a 46 y.o. male presenting with drainage at PD site and uncontrolled hypertension due to missed medications.  PMH includes hypertension, ESRD on HD M/T/TH/F (transitioning to PD), type 2 diabetes, osteomyelitis, BPH, constipation, peripheral neuropathy, macular degeneration, hyperlipidemia, GAD.    * Leukocytosis-resolved as of 09/17/2022 Resolved. WBC back down to 12.7. Unsure cause of leukocytosis. PD catheter site not likely source of infection. Patient remained afebrile.    Poorly-controlled hypertension in the setting N/V inability to take HTN meds BP improved with intermittent episodes of hypertension, this morning BP up to high 656C systolic. Initially on nitro drip which is now off.  -continue home hydralazine tid -continue torsemide 40 mg daily -continue to monitor BP -continue cardiac telemetry -consider IV antihypertensives if systolic above 127N  QT prolongation-resolved as of 09/17/2022 Resolved. Qtc 425.  - Restarted trazadone and remeron on discharge.    ESRD (end stage renal disease) on dialysis [facility]) Hemodialysis schedule M/T/TH/F.  Oliguric. PD catheter with drainage. PD catheter not concerning for infection according to Vascular surgery. Will try to flush PD catheter for dialysis today; otherwise will be removed outpatient.   - Nephrology following, appreciate recs - Plan for hemodialysis today  -  monitor RFP  - avoid nephrotoxic agents   Type 2 diabetes mellitus with chronic kidney disease, with long-term current use of insulin (HCC) A1c 05/2022 was 7.7.  Takes 12 units of insulin BID, uses 70/30 mix.  CBGs appropriate, no hypoglycemic episodes.  - continue NPH 8 units BID and SSI  - monitor CBGs  Nausea & vomiting Improved  - continue scheduled reglan - continue prn compazine  - continue to monitor   FEN/GI: Renal diet  PPx: heparin  Dispo:Discharge today.   Subjective:  Patient says he is feeling much better.  He does not have any concerns today.  He denies feeling chills, heart racing, sweats.  Objective: Temp:  [98.4 F (36.9 C)-99 F (37.2 C)] 98.8 F (37.1 C) (11/24 0724) Pulse Rate:  [72-79] 72 (11/24 0724) Resp:  [16-18] 17 (11/24 0724) BP: (139-163)/(76-82) 147/76 (11/24 0724) SpO2:  [94 %-95 %] 94 % (11/24 0724)  Physical Exam: General: Well-appearing sitting in bed, not ill-appearing Cardiovascular: Regular rate and rhythm, pulses equal and palpable, cap refill within normal limits Respiratory: No increased work of breathing on room air Abdomen: Soft, nontender, nondistended Extremities: No peripheral edema  Laboratory: Most recent CBC Lab Results  Component Value Date   WBC 12.7 (H) 09/17/2022   HGB 8.8 (L) 09/17/2022   HCT 26.3 (L) 09/17/2022   MCV 91.6 09/17/2022   PLT 195 09/17/2022   Most recent BMP    Latest Ref Rng & Units 09/17/2022    1:45 AM  BMP  Glucose 70 - 99 mg/dL 138   BUN 6 - 20 mg/dL 55   Creatinine 0.61 - 1.24 mg/dL 5.66   Sodium 135 - 145 mmol/L 137   Potassium 3.5 - 5.1 mmol/L 3.8   Chloride 98 - 111 mmol/L 95   CO2 22 - 32 mmol/L 22   Calcium 8.9 - 10.3 mg/dL 8.3     Lowry Ram, MD 09/17/2022, 8:37  AM  PGY-1, [place] Intern pager: [phone], text pages welcome Secure chat group [place]

## 2022-09-17 NOTE — Progress Notes (Signed)
FMTS Interim Note   Received a secure chat from nurse that patient's wife would like a call from primary team. Promptly responded and called. Patient's wife was upset as she says that primary team was unreachable by secure chat according to multiple sources. I explained to the patient's wife that we had not received any secure Chat messages and that we should have been paged in this case; however, we did not receive any pages.  We will let her know that we were concerned regarding his leukocytosis as his white blood cell count was very elevated.  We had also ordered the blood cultures which would have needed 24 hours to result.  Patient's wife was also upset as she says that nephrology and vascular teams had okayed patient for discharge despite the leukocytosis and pending blood cultures.  She says that the nephrologist called her and told her that patient was okay for discharge and that his leukocytosis was fine.  I let her know that this was not in their note or there messages relayed to [place] through phone calls.  Discussed with patient's wife that nephrology and vascular teams were mainly in charge of dialysis scheduling, dialysis medications, kidney function and treatment/management of PD and HD catheters.  Discussed with patient's wife that primary team was evaluating the leukocytosis and that leukocytosis did not resolve until this a.m. with white blood cell count down to 12.7 this a.m. and 24 hours of blood cultures being negative as of this a.m. patient's wife was also under the impression that primary team did not "okay" patient for dialysis yesterday due to the leukocytosis.  I will let patient's wife know that patient was supposed to get dialysis today and that his usual clinic did not have availability, nephrology team was going to attempt flushing PD catheter today at dialysis.  Also let patient know that nephrology team attempted to get earlier timing for dialysis however earliest time for dialysis was  second chair today.  I let patient's wife know that patient would be okay for discharge as soon as dialysis was finished.  Patient's wife let me know that she would most likely be putting in a complaint as on her and she feels like there are have been multiple miscommunications as she feels like she has not been able to get in touch with primary team.  I let patient's wife know that if we heard that she was trying to get in contact with [place] we would have called her and would be in touch with her promptly from now on.  Lowry Ram, MD  PGY-1 [facility]

## 2022-09-17 NOTE — Progress Notes (Signed)
Case discussed with Dr. Joelyn Oms.  No evidence of infection.  Will plan to attempt to use PD cath as outpatient.  If that is unsuccessful, he will need to have the catheter removed.  Annamarie Major

## 2022-09-20 LAB — CULTURE, BLOOD (ROUTINE X 2)
Culture: NO GROWTH
Culture: NO GROWTH
Special Requests: ADEQUATE
Special Requests: ADEQUATE

## 2022-09-22 ENCOUNTER — Other Ambulatory Visit: Payer: 59

## 2022-09-22 ENCOUNTER — Other Ambulatory Visit: Payer: Self-pay | Admitting: *Deleted

## 2022-09-22 DIAGNOSIS — N186 End stage renal disease: Secondary | ICD-10-CM

## 2022-09-23 ENCOUNTER — Ambulatory Visit (HOSPITAL_COMMUNITY)
Admission: RE | Admit: 2022-09-23 | Discharge: 2022-09-23 | Disposition: A | Discharge status: Home / Self Care | Payer: 59 | Source: Ambulatory Visit | Attending: Vascular Surgery | Admitting: Vascular Surgery

## 2022-09-23 ENCOUNTER — Ambulatory Visit (INDEPENDENT_AMBULATORY_CARE_PROVIDER_SITE_OTHER)
Admission: RE | Admit: 2022-09-23 | Discharge: 2022-09-23 | Disposition: A | Discharge status: Home / Self Care | Payer: 59 | Source: Ambulatory Visit | Attending: Vascular Surgery | Admitting: Vascular Surgery

## 2022-09-23 DIAGNOSIS — N186 End stage renal disease: Secondary | ICD-10-CM | POA: Diagnosis not present

## 2022-09-27 ENCOUNTER — Other Ambulatory Visit (INDEPENDENT_AMBULATORY_CARE_PROVIDER_SITE_OTHER): Payer: 59

## 2022-09-27 DIAGNOSIS — E1065 Type 1 diabetes mellitus with hyperglycemia: Secondary | ICD-10-CM

## 2022-09-27 LAB — HEMOGLOBIN A1C: Hgb A1c MFr Bld: 7.8 % — ABNORMAL HIGH (ref 4.6–6.5)

## 2022-09-27 LAB — GLUCOSE, RANDOM: Glucose, Bld: 114 mg/dL — ABNORMAL HIGH (ref 70–99)

## 2022-09-28 ENCOUNTER — Ambulatory Visit (INDEPENDENT_AMBULATORY_CARE_PROVIDER_SITE_OTHER): Payer: 59 | Admitting: Vascular Surgery

## 2022-09-28 ENCOUNTER — Encounter: Payer: Self-pay | Admitting: Vascular Surgery

## 2022-09-28 VITALS — BP 107/63 | HR 68 | Temp 98.4°F | Resp 20 | Ht 71.0 in | Wt 200.0 lb

## 2022-09-28 DIAGNOSIS — Z992 Dependence on renal dialysis: Secondary | ICD-10-CM | POA: Diagnosis not present

## 2022-09-28 DIAGNOSIS — N186 End stage renal disease: Secondary | ICD-10-CM

## 2022-09-28 NOTE — Progress Notes (Signed)
VASCULAR AND VEIN SPECIALISTS OF [place] PROGRESS NOTE  ASSESSMENT / PLAN: Jose [name] is a 46 y.o. male with end stage renal disease dialyzing Monday, Tuesday, Thursday and Friday through tunneled line in right chest.  I have placed and revised a peritoneal dialysis catheter for him.  Recent imaging suggest the catheter is well-positioned without technical problem.  His dialysis center has not used the catheter despite instructions from Dr. Trula Slade a week ago.  Recommend trying to use the peritoneal dialysis catheter.  If this is not successful, we will transition him to hemodialysis.  He prefers access in his left arm, and so we will arrange for a left arm graft in the case that his PD catheter is nonfunctional.  SUBJECTIVE: Returns to clinic for follow-up.  Doing well overall.  PD catheter is not irritating him.  His dialysis center has not used the peritoneal dialysis catheter since his discharge from the hospital.  OBJECTIVE: BP 107/63 (BP Location: Left Arm, Patient Position: Sitting, Cuff Size: Normal)   Pulse 68   Temp 98.4 F (36.9 C)   Resp 20   Ht 5\' 11"  (1.803 m)   Wt 200 lb (90.7 kg)   SpO2 98%   BMI 27.89 kg/m   Appearing Regular rate and rhythm Unlabored breathing Nondistended abdomen      Latest Ref Rng & Units 09/17/2022    1:45 AM 09/16/2022    1:24 AM 09/15/2022    3:01 PM  CBC  WBC 4.0 - 10.5 K/uL 12.7  19.7  19.2   Hemoglobin 13.0 - 17.0 g/dL 8.8  9.4  10.2   Hematocrit 39.0 - 52.0 % 26.3  26.9  29.7   Platelets 150 - 400 K/uL 195  227  262         Latest Ref Rng & Units 09/27/2022    1:50 PM 09/17/2022    1:45 AM 09/16/2022    1:24 AM  CMP  Glucose 70 - 99 mg/dL 114  138  150   BUN 6 - 20 mg/dL  55  40   Creatinine 0.61 - 1.24 mg/dL  5.66  4.62   Sodium 135 - 145 mmol/L  137  133   Potassium 3.5 - 5.1 mmol/L  3.8  3.7   Chloride 98 - 111 mmol/L  95  93   CO2 22 - 32 mmol/L  22  25   Calcium 8.9 - 10.3 mg/dL  8.3  8.6     Estimated  Creatinine Clearance: 18.4 mL/min (A) (by C-G formula based on SCr of 5.66 mg/dL (H)).  Vein mapping shows no usable arm vein in the left arm.  His right basilic vein is of adequate caliber for basilic vein transposition.   [name]. Stanford Breed, MD [facility] V[facility] and Vein Specialists of [facility] Phone Number: [phone] 09/28/2022 2:27 PM

## 2022-09-28 NOTE — H&P (View-Only) (Signed)
VASCULAR AND VEIN SPECIALISTS OF [place] PROGRESS NOTE  ASSESSMENT / PLAN: Jose [name] is a 46 y.o. male with end stage renal disease dialyzing Monday, Tuesday, Thursday and Friday through tunneled line in right chest.  I have placed and revised a peritoneal dialysis catheter for him.  Recent imaging suggest the catheter is well-positioned without technical problem.  His dialysis center has not used the catheter despite instructions from Dr. Trula Slade a week ago.  Recommend trying to use the peritoneal dialysis catheter.  If this is not successful, we will transition him to hemodialysis.  He prefers access in his left arm, and so we will arrange for a left arm graft in the case that his PD catheter is nonfunctional.  SUBJECTIVE: Returns to clinic for follow-up.  Doing well overall.  PD catheter is not irritating him.  His dialysis center has not used the peritoneal dialysis catheter since his discharge from the hospital.  OBJECTIVE: BP 107/63 (BP Location: Left Arm, Patient Position: Sitting, Cuff Size: Normal)   Pulse 68   Temp 98.4 F (36.9 C)   Resp 20   Ht 5\' 11"  (1.803 m)   Wt 200 lb (90.7 kg)   SpO2 98%   BMI 27.89 kg/m   Appearing Regular rate and rhythm Unlabored breathing Nondistended abdomen      Latest Ref Rng & Units 09/17/2022    1:45 AM 09/16/2022    1:24 AM 09/15/2022    3:01 PM  CBC  WBC 4.0 - 10.5 K/uL 12.7  19.7  19.2   Hemoglobin 13.0 - 17.0 g/dL 8.8  9.4  10.2   Hematocrit 39.0 - 52.0 % 26.3  26.9  29.7   Platelets 150 - 400 K/uL 195  227  262         Latest Ref Rng & Units 09/27/2022    1:50 PM 09/17/2022    1:45 AM 09/16/2022    1:24 AM  CMP  Glucose 70 - 99 mg/dL 114  138  150   BUN 6 - 20 mg/dL  55  40   Creatinine 0.61 - 1.24 mg/dL  5.66  4.62   Sodium 135 - 145 mmol/L  137  133   Potassium 3.5 - 5.1 mmol/L  3.8  3.7   Chloride 98 - 111 mmol/L  95  93   CO2 22 - 32 mmol/L  22  25   Calcium 8.9 - 10.3 mg/dL  8.3  8.6     Estimated  Creatinine Clearance: 18.4 mL/min (A) (by C-G formula based on SCr of 5.66 mg/dL (H)).  Vein mapping shows no usable arm vein in the left arm.  His right basilic vein is of adequate caliber for basilic vein transposition.   Jose [name]. Stanford Breed, MD [facility] Vascular and Vein Specialists of [facility] Phone Number: [phone] 09/28/2022 2:27 PM

## 2022-09-29 ENCOUNTER — Ambulatory Visit: Payer: 59 | Admitting: Endocrinology

## 2022-10-04 ENCOUNTER — Encounter: Payer: Self-pay | Admitting: Endocrinology

## 2022-10-04 ENCOUNTER — Ambulatory Visit (INDEPENDENT_AMBULATORY_CARE_PROVIDER_SITE_OTHER): Payer: 59 | Admitting: Endocrinology

## 2022-10-04 VITALS — BP 142/80 | HR 73 | Ht 71.0 in | Wt 205.0 lb

## 2022-10-04 DIAGNOSIS — N186 End stage renal disease: Secondary | ICD-10-CM | POA: Diagnosis not present

## 2022-10-04 DIAGNOSIS — E1165 Type 2 diabetes mellitus with hyperglycemia: Secondary | ICD-10-CM | POA: Diagnosis not present

## 2022-10-04 DIAGNOSIS — Z794 Long term (current) use of insulin: Secondary | ICD-10-CM | POA: Diagnosis not present

## 2022-10-04 DIAGNOSIS — K3184 Gastroparesis: Secondary | ICD-10-CM

## 2022-10-04 DIAGNOSIS — Z992 Dependence on renal dialysis: Secondary | ICD-10-CM

## 2022-10-04 DIAGNOSIS — E1143 Type 2 diabetes mellitus with diabetic autonomic (poly)neuropathy: Secondary | ICD-10-CM

## 2022-10-04 MED ORDER — TOUJEO SOLOSTAR 300 UNIT/ML ~~LOC~~ SOPN: 20.0000 [IU] | PEN_INJECTOR | Freq: Every day | SUBCUTANEOUS | 3 refills | Status: DC

## 2022-10-04 MED ORDER — INSULIN LISPRO (1 UNIT DIAL) 100 UNIT/ML (KWIKPEN): 10.0000 [IU] | PEN_INJECTOR | Freq: Three times a day (TID) | SUBCUTANEOUS | 2 refills | Status: AC

## 2022-10-04 NOTE — Patient Instructions (Signed)
TOUJEO  insulin: This insulin provides blood sugar control for up to 24 hours.   Start with 14 units at pm time ONCE daily and increase by 2 units every 3 days until the waking up sugars are under 130. Then continue the same dose.  If blood sugar is under 90 for 2 days in a row, reduce the dose by 2 units.  Note that this insulin does not control the rise of blood sugar with meals

## 2022-10-04 NOTE — Progress Notes (Signed)
Patient ID: Jose [name], male   DOB: 1975/11/27, 46 y.o.   MRN: [id number]           Reason for Appointment: Type II Diabetes follow-up   History of Present Illness   Diagnosis date: age 23  Previous history:  He was initially put on medications at the time of diagnosis but only in his early 10s  Non-insulin hypoglycemic drugs previously used: Trulicity, Byetta, Amaryl, metformin, Tradjenta, Farxiga and Invokana Insulin was started in 2022 He has previously been taking Levemir and Lantus insulin as well as NovoLog  A1c range in the last few years is:7-14.2  Recent history:     Non-insulin hypoglycemic drugs: None     Insulin regimen: 12 units of 70/30 a.m. and 14 units of insulin before dinner    Side effects from medications: Trulicity caused nausea and tremor  Current self management, blood sugar patterns and problems identified:  A1c is 7.8 Previously had not switched to basal bolus insulin regimen as recommended and was wanting to continue premixed insulin His evening dose was increased by 6 units but he has not increase it further despite significantly high readings after dinner Also blood sugars are highly variable With generally not eating breakfast on the days of dialysis his blood sugars come down by midday but are generally higher after lunch and highest around midnight as discussed in the CGM description below His weight has gone up a little bit even though he has had issues with gastroparesis  Exercise: Minimal  Diet management: Mostly eating 2 meals     Hypoglycemia: As above  Freestyle libre 3 analysis for the last 2 weeks as follows  Blood sugar data is only available between 12/6 and today Blood sugars are showing significant hyperglycemia at most times except late morning when they are the lowest HIGHEST blood sugars on an average are around midnight and blood sugars are averaging over 200 between 6 PM-4 AM  OVERNIGHT blood sugars are mostly  variable but generally higher than target and somewhat better by 4-6 AM  Blood sugars show decline between about 8 AM-11 AM but still over 140 average and no hypoglycemia Blood sugars are showing variable increase midday but usually higher than target After dinner blood sugars are appearing to rise progressively until about 11 PM with some variability  There is no hypoglycemia present  CGM use % of time 36  2-week average/GV 200/29  Time in range       43%  % Time Above 180 38  % Time above 250 19  % Time Below 70      PRE-MEAL Fasting Lunch Dinner Bedtime Overall  Glucose range:       Averages: 193    200   POST-MEAL PC Breakfast PC Lunch PC Dinner  Glucose range:     Averages:  199 230    Previously  CGM use % of time 89  2-week average/GV 221/32  Time in range      27%  % Time Above 180 38  % Time above 250 35  % Time Below 70 0     PRE-MEAL Fasting Lunch Dinner Bedtime Overall  Glucose range:       Averages: 200  189 271    POST-MEAL PC Breakfast PC Lunch PC Dinner  Glucose range:     Averages: 176  253    Weight control:  Wt Readings from Last 3 Encounters:  10/04/22 205 lb (93 kg)  09/28/22 200 lb (  90.7 kg)  09/17/22 188 lb 4.4 oz (85.4 kg)            Diabetes labs: A1c recently was 8.2 at dialysis center  Lab Results  Component Value Date   HGBA1C 7.8 (H) 09/27/2022   HGBA1C 7.7 (H) 06/17/2022   HGBA1C 7.8 (H) 06/05/2022   Lab Results  Component Value Date   LDLCALC 57 11/22/2019   CREATININE 5.66 (H) 09/17/2022   Lab Results  Component Value Date   FRUCTOSAMINE 387 (H) 04/23/2022     Allergies as of 10/04/2022       Reactions   Cranberry Itching   Hm Lidocaine Patch [lidocaine] Dermatitis   Blisters skin    Melatonin Other (See Comments)   nightmares   Nsaids Other (See Comments)   Stage 3 kidney disease   Trulicity [dulaglutide] Other (See Comments)   Night sweats  Uncontrolled tempeture         Medication List         Accurate as of October 04, 2022  1:57 PM. If you have any questions, ask your nurse or doctor.          acetaminophen 500 MG tablet Commonly known as: TYLENOL Take 500-1,000 mg by mouth every 6 (six) hours as needed for mild pain.   albuterol 108 (90 Base) MCG/ACT inhaler Commonly known as: VENTOLIN HFA Inhale 1 puff into the lungs every 4 (four) hours as needed for wheezing or shortness of breath.   ALPRAZolam 0.25 MG tablet Commonly known as: XANAX Take 0.25 mg by mouth 2 (two) times daily as needed for anxiety.   atorvastatin 40 MG tablet Commonly known as: LIPITOR Take 40 mg by mouth daily.   cholecalciferol 25 MCG (1000 UNIT) tablet Commonly known as: VITAMIN D3 Take 1,000 Units by mouth daily.   FreeStyle Libre 3 Sensor Misc 1 Device by Does not apply route every 14 (fourteen) days. Apply 1 sensor on upper arm every 14 days for continuous glucose monitoring   gabapentin 100 MG capsule Commonly known as: NEURONTIN Take 2 capsules (200 mg total) by mouth 2 (two) times daily.   hydrALAZINE 50 MG tablet Commonly known as: APRESOLINE Take 1 tablet (50 mg total) by mouth every 8 (eight) hours. What changed: when to take this   hydrOXYzine 25 MG tablet Commonly known as: ATARAX TAKE 1 TABLET(25 MG) BY MOUTH THREE TIMES DAILY AS NEEDED FOR ANXIETY OR NAUSEA What changed: See the new instructions.   insulin NPH-regular Human (70-30) 100 UNIT/ML injection Inject 8-12 Units into the skin See admin instructions. Taking 10 units in the Am and 14 units at night   iron polysaccharides 150 MG capsule Commonly known as: NIFEREX Take 1 capsule (150 mg total) by mouth daily.   metoCLOPramide 5 MG tablet Commonly known as: Reglan Take 1 tablet (5 mg total) by mouth every 8 (eight) hours as needed for nausea.   Metoprolol Tartrate 75 MG Tabs Take 75 mg by mouth 2 (two) times daily.   mirtazapine 15 MG tablet Commonly known as: REMERON Take 1 tablet (15 mg total) by mouth  at bedtime.   oxyCODONE 5 MG immediate release tablet Commonly known as: Roxicodone Take 1 tablet (5 mg total) by mouth every 6 (six) hours as needed.   pantoprazole 40 MG tablet Commonly known as: PROTONIX Take 1 tablet (40 mg total) by mouth daily.   PARoxetine 20 MG tablet Commonly known as: PAXIL Take 1 tablet (20 mg total) by mouth at bedtime.  Pentips 32G X 4 MM Misc Generic drug: Insulin Pen Needle use as directed   polyethylene glycol 17 g packet Commonly known as: MIRALAX / GLYCOLAX Take 17 g by mouth daily as needed for moderate constipation, mild constipation or severe constipation.   prochlorperazine 5 MG tablet Commonly known as: COMPAZINE Take 1 tablet (5 mg total) by mouth every 8 (eight) hours as needed for nausea or vomiting. If having nausea despite Reglan   sevelamer carbonate 800 MG tablet Commonly known as: RENVELA Take 800 mg by mouth 3 (three) times daily with meals.   tamsulosin 0.4 MG Caps capsule Commonly known as: FLOMAX Take 1 capsule (0.4 mg total) by mouth daily after supper.   Torsemide 40 MG Tabs Take 40 mg by mouth daily.   traZODone 100 MG tablet Commonly known as: DESYREL Take 100 mg by mouth at bedtime as needed for sleep.        Allergies:  Allergies  Allergen Reactions   Cranberry Itching   Hm Lidocaine Patch [Lidocaine] Dermatitis    Blisters skin    Melatonin Other (See Comments)    nightmares   Nsaids Other (See Comments)    Stage 3 kidney disease   Trulicity [Dulaglutide] Other (See Comments)    Night sweats  Uncontrolled tempeture     Past Medical History:  Diagnosis Date   AKI (acute kidney injury) ([place])    Anemia    Family history of adverse reaction to anesthesia    daughter age 72 months , coded after recieving 1 injection  for surgery - an inhalation med and morphine   GAD (generalized anxiety disorder)    Headache    Hyperlipidemia    Hypertension    Macular degeneration, bilateral    Pneumonia     Retinopathy    Type II diabetes mellitus with complication, uncontrolled    retinopathy, neuropathy, microalbuminuria    Past Surgical History:  Procedure Laterality Date   APPENDECTOMY     BIOPSY  08/10/2021   Procedure: BIOPSY;  Surgeon: Otis Brace, MD;  Location: [place];  Service: Gastroenterology;;   BUBBLE STUDY  07/29/2021   Procedure: BUBBLE STUDY;  Surgeon: Jerline Pain, MD;  Location: [facility];  Service: Cardiovascular;;   CAPD INSERTION N/A 08/25/2022   Procedure: LAPAROSCOPIC INSERTION CONTINUOUS AMBULATORY PERITONEAL DIALYSIS  (CAPD) CATHETER WITH OMENTOPEXY;  Surgeon: [name], MD;  Location: [facility];  Service: Vascular;  Laterality: N/A;   ESOPHAGOGASTRODUODENOSCOPY (EGD) WITH PROPOFOL N/A 08/10/2021   Procedure: ESOPHAGOGASTRODUODENOSCOPY (EGD) WITH PROPOFOL;  Surgeon: Otis Brace, MD;  Location: [facility];  Service: Gastroenterology;  Laterality: N/A;   ESOPHAGOGASTRODUODENOSCOPY (EGD) WITH PROPOFOL N/A 08/24/2021   Procedure: ESOPHAGOGASTRODUODENOSCOPY (EGD) WITH PROPOFOL;  Surgeon: Otis Brace, MD;  Location: [facility];  Service: Gastroenterology;  Laterality: N/A;   HERNIA REPAIR     IR FLUORO GUIDED NEEDLE PLC ASPIRATION/INJECTION LOC  07/28/2021   LUMBAR LAMINECTOMY/DECOMPRESSION MICRODISCECTOMY N/A 08/07/2021   Procedure: THORACIC FOUR - THORACIC FIVE LAMINECTOMY/DECOMPRESSION OF SPINAL CORD, DEBRIDEMENT OF ABSCESS, MICRODISCECTOMY, INTRAOPERATIVE ULTRASOUND;  Surgeon: Consuella Lose, MD;  Location: [place];  Service: Neurosurgery;  Laterality: N/A;   TEE WITHOUT CARDIOVERSION N/A 07/29/2021   Procedure: TRANSESOPHAGEAL ECHOCARDIOGRAM (TEE);  Surgeon: Jerline Pain, MD;  Location[facility];  Service: Cardiovascular;  Laterality: N/A;   TRIGGER FINGER RELEASE Right 10/25/2019   Procedure: RIGHT INDEX FINGER RELEASE TRIGGER FINGER/A-1 PULLEY;  Surgeon: Daryll Brod, MD;  Location: [place];  Service:  Orthopedics;  Laterality: Right;  IV REGIONAL FOREARM BLOCK    Family History  Problem Relation Age of Onset   Diabetes Mother    Hyperlipidemia Mother    Stroke Mother    Diabetes Father    Hyperlipidemia Brother    Stroke Brother    ADD / ADHD Brother    ADD / ADHD Son     Social History:  reports that he quit smoking about 2 years ago. His smoking use included cigarettes. He has a 20.00 pack-year smoking history. He has never used smokeless tobacco. He reports that he does not currently use alcohol. He reports that he does not use drugs.  Review of Systems:  He is having intermittent nausea and has gastroparesis reportedly  Last diabetic eye exam date 1/21  Last foot exam date: 8/22  Hypertension:   Treatment does not include ACE inhibitor or ARB, followed by nephrologist  BP Readings from Last 3 Encounters:  10/04/22 (!) 142/80  09/28/22 107/63  09/17/22 (!) 140/82   Lab Results  Component Value Date   CREATININE 5.66 (H) 09/17/2022   CREATININE 4.62 (H) 09/16/2022   CREATININE 2.91 (H) 09/15/2022     Lipid management: Managed with atorvastatin 40 mg daily    Lab Results  Component Value Date   CHOL 116 11/22/2019   CHOL 110 07/20/2019   CHOL 101 12/12/2018   Lab Results  Component Value Date   HDL 42 11/22/2019   HDL 38 (L) 07/20/2019   HDL 47 12/12/2018   Lab Results  Component Value Date   LDLCALC 57 11/22/2019   LDLCALC 46 07/20/2019   LDLCALC 40 12/12/2018   Lab Results  Component Value Date   TRIG 174 (H) 08/17/2021   TRIG 181 (H) 08/14/2021   TRIG 140 05/18/2021   Lab Results  Component Value Date   CHOLHDL 2.8 11/22/2019   CHOLHDL 2.9 07/20/2019   CHOLHDL 2.1 12/12/2018   Lab Results  Component Value Date   LDLDIRECT 115.0 04/23/2022     Examination:   BP (!) 142/80   Pulse 73   Ht 5\' 11"  (1.803 m)   Wt 205 lb (93 kg)   SpO2 98%   BMI 28.59 kg/m   Body mass index is 28.59 kg/m.    ASSESSMENT/ PLAN:    Diabetes  type 2 on insulin:   Current regimen: 70/30 insulin twice a day  See history of present illness for detailed discussion of current diabetes management, blood sugar patterns and problems identified  A1c is last 7.7  His blood sugars are still averaging about 200 at home  His blood sugar patterns indicate that he is not getting enough basal insulin, relatively more insulin in the mornings especially when he is not eating breakfast and not enough insulin to cover his evening meal   Discussed that he needs to switch to a basal bolus insulin regimen rather than premixed insulin with potential for much better coverage and adjustment of doses to cover both preprandial and postprandial readings with some flexibility  Also not a candidate for GLP-1 drug because of gastroparesis   Recommendations:   Discussed how basal insulin works, timing of injection, dosage, injection sites.  Also discussed titration based on fasting blood sugar every 3 days by 2 units and at target of 90-130 for fasting reading.  Given a flowsheet with instructions on how to keep a record and adjust the doses Start 14 units of Toujeo once a day in the evening Today discussed in detail the need for  mealtime insulin to cover postprandial spikes, action of mealtime insulin, use of the insulin pen, timing and action of the rapid acting insulin as well as starting dose and dosage titration to target the two-hour reading of under 180 He will take 4 units for small meals, 6 units for average meals and 8 units for larger meals to start with.  Discussed need to take the insulin a few minutes before eating or at the most a few minutes right after finishing eating  There are no Patient Instructions on file for this visit.  Total visit including counseling = 30 minutes  Elayne Snare 10/04/2022, 1:57 PM

## 2022-10-05 ENCOUNTER — Encounter: Payer: Self-pay | Admitting: Endocrinology

## 2022-10-05 ENCOUNTER — Telehealth: Payer: Self-pay

## 2022-10-05 NOTE — Telephone Encounter (Signed)
Received a call from patient requesting a PD catheter removal and fistula placement.   [place]- spoke with Jolayne [name] to inquire about the functional status of patient's PD catheter. She advised nursing staff has been unsuccessful in using it and plans to start home training for home HD.   Will forward to Dr. Stanford Breed to advise.

## 2022-10-06 NOTE — Telephone Encounter (Signed)
Per Dr. Stanford Breed, scheduled patient for PD catheter removal and left arteriovenous graft.   Attempted to reach patient to schedule, no answer. Left message for patient to return call.

## 2022-10-07 ENCOUNTER — Other Ambulatory Visit: Payer: Self-pay

## 2022-10-07 DIAGNOSIS — N186 End stage renal disease: Secondary | ICD-10-CM

## 2022-10-07 DIAGNOSIS — T85611A Breakdown (mechanical) of intraperitoneal dialysis catheter, initial encounter: Secondary | ICD-10-CM

## 2022-10-07 NOTE — Telephone Encounter (Signed)
Spoke with patient- scheduled for surgery as indicated per Dr. Stanford Breed on 10/13/22. Instructions reviewed- patient verbalized understanding.

## 2022-10-11 ENCOUNTER — Encounter (HOSPITAL_COMMUNITY): Payer: Self-pay | Admitting: Vascular Surgery

## 2022-10-11 NOTE — Progress Notes (Signed)
PCP - Roddie Mc, NP  Chest x-ray - 09/15/22 EKG - 09/17/22 ECHO - 07/29/21  Fasting Blood Sugar - 129 Checks Blood Sugar Through out the day. Wears CBM  ERAS Protcol - NPO  Anesthesia review: N  Patient verbally denies any shortness of breath, fever, cough and chest pain during phone call   -------------  SDW INSTRUCTIONS given:  Your procedure is scheduled on 10/13/22.  Report to [facility] Main Entrance "A" at [place].M[address] and check in at the Admitting office.  Call this number if you have problems the morning of surgery:  [phone]   Remember:  Do not eat or drink after midnight the night before your surgery    Take these medicines the morning of surgery with A SIP OF WATER  atorvastatin (LIPITOR)  gabapentin (NEURONTIN)  hydrALAZINE (APRESOLINE)  metoCLOPramide (REGLAN)  Metoprolol Tartrate  pantoprazole (PROTONIX)  acetaminophen (TYLENOL)-if needed albuterol (VENTOLIN HFA)-if needed (please bring on the day of surgery) ALPRAZolam (XANAX)-if needed  **insulin glargine** Tues 10/12/22 at bedtime take 7 units. Morning of surgery take 6 units.  **insulin lispro** If insulin is greater than 220 on the morning of surgery, please take 1/2 of normal correction dose.   .** PLEASE check your blood sugar the morning of your surgery when you wake up and every 2 hours until you get to the Short Stay unit.  If your blood sugar is less than 70 mg/dL, you will need to treat for low blood sugar: Do not take insulin. Treat a low blood sugar (less than 70 mg/dL) with  cup of clear juice (cranberry or apple), 4 glucose tablets, OR glucose gel. Recheck blood sugar in 15 minutes after treatment (to make sure it is greater than 70 mg/dL). If your blood sugar is not greater than 70 mg/dL on recheck, call [phone] for further instructions.   As of today, STOP taking any Aspirin (unless otherwise instructed by your surgeon) Aleve, Naproxen, Ibuprofen, Motrin, Advil, Goody's,  BC's, all herbal medications, fish oil, and all vitamins.                      Do not wear jewelry, make up, or nail polish            Do not wear lotions, powders, perfumes/colognes, or deodorant.            Do not shave 48 hours prior to surgery.  Men may shave face and neck.            Do not bring valuables to the hospital.            [facility] is not responsible for any belongings or valuables.  Do NOT Smoke (Tobacco/Vaping) 24 hours prior to your procedure If you use a CPAP at night, you may bring all equipment for your overnight stay.   Contacts, glasses, dentures or bridgework may not be worn into surgery.      For patients admitted to the hospital, discharge time will be determined by your treatment team.   Patients discharged the day of surgery will not be allowed to drive home, and someone needs to stay with them for 24 hours.    Special instructions:   [place]- Preparing For Surgery  Before surgery, you can play an important role. Because skin is not sterile, your skin needs to be as free of germs as possible. You can reduce the number of germs on your skin by washing with CHG (chlorahexidine gluconate) Soap  before surgery.  CHG is an antiseptic cleaner which kills germs and bonds with the skin to continue killing germs even after washing.    Oral Hygiene is also important to reduce your risk of infection.  Remember - BRUSH YOUR TEETH THE MORNING OF SURGERY WITH YOUR REGULAR TOOTHPASTE  Please do not use if you have an allergy to CHG or antibacterial soaps. If your skin becomes reddened/irritated stop using the CHG.  Do not shave (including legs and underarms) for at least 48 hours prior to first CHG shower. It is OK to shave your face.  Please follow these instructions carefully.   Shower the NIGHT BEFORE SURGERY and the MORNING OF SURGERY with DIAL Soap.   Pat yourself dry with a CLEAN TOWEL.  Wear CLEAN PAJAMAS to bed the night before surgery  Place CLEAN  SHEETS on your bed the night of your first shower and DO NOT SLEEP WITH PETS.   Day of Surgery: Please shower morning of surgery  Wear Clean/Comfortable clothing the morning of surgery Do not apply any deodorants/lotions.   Remember to brush your teeth WITH YOUR REGULAR TOOTHPASTE.   Questions were answered. Patient verbalized understanding of instructions.

## 2022-10-13 ENCOUNTER — Ambulatory Visit (HOSPITAL_BASED_OUTPATIENT_CLINIC_OR_DEPARTMENT_OTHER): Payer: 59 | Admitting: Anesthesiology

## 2022-10-13 ENCOUNTER — Encounter (HOSPITAL_COMMUNITY): Payer: Self-pay | Admitting: Vascular Surgery

## 2022-10-13 ENCOUNTER — Ambulatory Visit (HOSPITAL_COMMUNITY)
Admission: RE | Admit: 2022-10-13 | Discharge: 2022-10-13 | Disposition: A | Discharge status: Home / Self Care | Payer: 59 | Attending: Vascular Surgery | Admitting: Vascular Surgery

## 2022-10-13 ENCOUNTER — Other Ambulatory Visit: Payer: Self-pay

## 2022-10-13 ENCOUNTER — Ambulatory Visit (HOSPITAL_COMMUNITY): Payer: 59 | Admitting: Anesthesiology

## 2022-10-13 ENCOUNTER — Encounter (HOSPITAL_COMMUNITY)
Admission: RE | Disposition: A | Discharge status: Home / Self Care | Payer: Self-pay | Source: Home / Self Care | Attending: Vascular Surgery

## 2022-10-13 DIAGNOSIS — D631 Anemia in chronic kidney disease: Secondary | ICD-10-CM | POA: Diagnosis not present

## 2022-10-13 DIAGNOSIS — Z992 Dependence on renal dialysis: Secondary | ICD-10-CM | POA: Insufficient documentation

## 2022-10-13 DIAGNOSIS — X58XXXA Exposure to other specified factors, initial encounter: Secondary | ICD-10-CM | POA: Diagnosis not present

## 2022-10-13 DIAGNOSIS — Z794 Long term (current) use of insulin: Secondary | ICD-10-CM | POA: Insufficient documentation

## 2022-10-13 DIAGNOSIS — T82898A Other specified complication of vascular prosthetic devices, implants and grafts, initial encounter: Secondary | ICD-10-CM | POA: Diagnosis not present

## 2022-10-13 DIAGNOSIS — Q2112 Patent foramen ovale: Secondary | ICD-10-CM | POA: Insufficient documentation

## 2022-10-13 DIAGNOSIS — Z87891 Personal history of nicotine dependence: Secondary | ICD-10-CM | POA: Diagnosis not present

## 2022-10-13 DIAGNOSIS — T8249XA Other complication of vascular dialysis catheter, initial encounter: Secondary | ICD-10-CM | POA: Diagnosis not present

## 2022-10-13 DIAGNOSIS — F419 Anxiety disorder, unspecified: Secondary | ICD-10-CM | POA: Diagnosis not present

## 2022-10-13 DIAGNOSIS — E1122 Type 2 diabetes mellitus with diabetic chronic kidney disease: Secondary | ICD-10-CM

## 2022-10-13 DIAGNOSIS — I120 Hypertensive chronic kidney disease with stage 5 chronic kidney disease or end stage renal disease: Secondary | ICD-10-CM | POA: Insufficient documentation

## 2022-10-13 DIAGNOSIS — K279 Peptic ulcer, site unspecified, unspecified as acute or chronic, without hemorrhage or perforation: Secondary | ICD-10-CM | POA: Diagnosis not present

## 2022-10-13 DIAGNOSIS — N185 Chronic kidney disease, stage 5: Secondary | ICD-10-CM | POA: Diagnosis not present

## 2022-10-13 DIAGNOSIS — R519 Headache, unspecified: Secondary | ICD-10-CM | POA: Insufficient documentation

## 2022-10-13 DIAGNOSIS — T85611A Breakdown (mechanical) of intraperitoneal dialysis catheter, initial encounter: Secondary | ICD-10-CM

## 2022-10-13 DIAGNOSIS — N186 End stage renal disease: Secondary | ICD-10-CM | POA: Insufficient documentation

## 2022-10-13 DIAGNOSIS — M1990 Unspecified osteoarthritis, unspecified site: Secondary | ICD-10-CM | POA: Insufficient documentation

## 2022-10-13 HISTORY — PX: CAPD REMOVAL: SHX5234

## 2022-10-13 HISTORY — PX: AV FISTULA PLACEMENT: SHX1204

## 2022-10-13 LAB — POCT I-STAT, CHEM 8
BUN: 18 mg/dL (ref 6–20)
Calcium, Ion: 1.11 mmol/L — ABNORMAL LOW (ref 1.15–1.40)
Chloride: 99 mmol/L (ref 98–111)
Creatinine, Ser: 3.4 mg/dL — ABNORMAL HIGH (ref 0.61–1.24)
Glucose, Bld: 163 mg/dL — ABNORMAL HIGH (ref 70–99)
HCT: 30 % — ABNORMAL LOW (ref 39.0–52.0)
Hemoglobin: 10.2 g/dL — ABNORMAL LOW (ref 13.0–17.0)
Potassium: 4.3 mmol/L (ref 3.5–5.1)
Sodium: 140 mmol/L (ref 135–145)
TCO2: 28 mmol/L (ref 22–32)

## 2022-10-13 LAB — GLUCOSE, CAPILLARY
Glucose-Capillary: 162 mg/dL — ABNORMAL HIGH (ref 70–99)
Glucose-Capillary: 181 mg/dL — ABNORMAL HIGH (ref 70–99)
Glucose-Capillary: 163 mg/dL — ABNORMAL HIGH (ref 70–99)

## 2022-10-13 SURGERY — LAPAROSCOPIC REMOVAL CONTINUOUS AMBULATORY PERITONEAL DIALYSIS  (CAPD) CATHETER
Anesthesia: General | Site: Arm Upper

## 2022-10-13 MED ORDER — ROCURONIUM BROMIDE 10 MG/ML (PF) SYRINGE
PREFILLED_SYRINGE | INTRAVENOUS | Status: AC
  Filled 2022-10-13: qty 10

## 2022-10-13 MED ORDER — ROCURONIUM BROMIDE 100 MG/10ML IV SOLN
INTRAVENOUS | Status: DC | PRN
  Administered 2022-10-13: 70 mg via INTRAVENOUS

## 2022-10-13 MED ORDER — ACETAMINOPHEN 10 MG/ML IV SOLN: 1000.0000 mg | Freq: Once | INTRAVENOUS | Status: DC | PRN

## 2022-10-13 MED ORDER — DEXAMETHASONE SODIUM PHOSPHATE 10 MG/ML IJ SOLN
INTRAMUSCULAR | Status: DC | PRN
  Administered 2022-10-13: 4 mg via INTRAVENOUS

## 2022-10-13 MED ORDER — OXYCODONE HCL 5 MG/5ML PO SOLN: 5.0000 mg | Freq: Once | ORAL | Status: AC | PRN

## 2022-10-13 MED ORDER — LIDOCAINE 2% (20 MG/ML) 5 ML SYRINGE
INTRAMUSCULAR | Status: AC
  Filled 2022-10-13: qty 5

## 2022-10-13 MED ORDER — MIDAZOLAM HCL 2 MG/2ML IJ SOLN
INTRAMUSCULAR | Status: AC
  Filled 2022-10-13: qty 2

## 2022-10-13 MED ORDER — PHENYLEPHRINE 80 MCG/ML (10ML) SYRINGE FOR IV PUSH (FOR BLOOD PRESSURE SUPPORT)
PREFILLED_SYRINGE | INTRAVENOUS | Status: DC | PRN
  Administered 2022-10-13 (×3): 80 ug via INTRAVENOUS

## 2022-10-13 MED ORDER — FENTANYL CITRATE (PF) 250 MCG/5ML IJ SOLN
INTRAMUSCULAR | Status: AC
  Filled 2022-10-13: qty 5

## 2022-10-13 MED ORDER — PROPOFOL 10 MG/ML IV BOLUS
INTRAVENOUS | Status: DC | PRN
  Administered 2022-10-13: 30 mg via INTRAVENOUS
  Administered 2022-10-13: 100 mg via INTRAVENOUS

## 2022-10-13 MED ORDER — HYDRALAZINE HCL 20 MG/ML IJ SOLN
10.0000 mg | Freq: Once | INTRAMUSCULAR | Status: AC
  Administered 2022-10-13: 10 mg via INTRAVENOUS

## 2022-10-13 MED ORDER — HEPARIN SODIUM (PORCINE) 1000 UNIT/ML IJ SOLN
INTRAMUSCULAR | Status: AC
  Filled 2022-10-13: qty 10

## 2022-10-13 MED ORDER — HEPARIN 6000 UNIT IRRIGATION SOLUTION
Status: AC
  Filled 2022-10-13: qty 500

## 2022-10-13 MED ORDER — CHLORHEXIDINE GLUCONATE 4 % EX LIQD: 60.0000 mL | Freq: Once | CUTANEOUS | Status: DC

## 2022-10-13 MED ORDER — CHLORHEXIDINE GLUCONATE 0.12 % MT SOLN
15.0000 mL | Freq: Once | OROMUCOSAL | Status: AC
  Administered 2022-10-13: 15 mL via OROMUCOSAL
  Filled 2022-10-13: qty 15

## 2022-10-13 MED ORDER — ONDANSETRON HCL 4 MG/2ML IJ SOLN
INTRAMUSCULAR | Status: DC | PRN
  Administered 2022-10-13: 4 mg via INTRAVENOUS

## 2022-10-13 MED ORDER — ACETAMINOPHEN 160 MG/5ML PO SOLN: 1000.0000 mg | Freq: Once | ORAL | Status: DC | PRN

## 2022-10-13 MED ORDER — ONDANSETRON HCL 4 MG/2ML IJ SOLN
INTRAMUSCULAR | Status: AC
  Filled 2022-10-13: qty 2

## 2022-10-13 MED ORDER — MIDAZOLAM HCL 5 MG/5ML IJ SOLN
INTRAMUSCULAR | Status: DC | PRN
  Administered 2022-10-13: 1 mg via INTRAVENOUS

## 2022-10-13 MED ORDER — FENTANYL CITRATE (PF) 100 MCG/2ML IJ SOLN
INTRAMUSCULAR | Status: AC
  Filled 2022-10-13: qty 2

## 2022-10-13 MED ORDER — PROPOFOL 10 MG/ML IV BOLUS
INTRAVENOUS | Status: AC
  Filled 2022-10-13: qty 20

## 2022-10-13 MED ORDER — EPHEDRINE SULFATE (PRESSORS) 50 MG/ML IJ SOLN
INTRAMUSCULAR | Status: DC | PRN
  Administered 2022-10-13 (×3): 5 mg via INTRAVENOUS

## 2022-10-13 MED ORDER — CEFAZOLIN SODIUM-DEXTROSE 2-4 GM/100ML-% IV SOLN
2.0000 g | INTRAVENOUS | Status: AC
  Administered 2022-10-13: 2 g via INTRAVENOUS
  Filled 2022-10-13: qty 100

## 2022-10-13 MED ORDER — HYDRALAZINE HCL 20 MG/ML IJ SOLN
INTRAMUSCULAR | Status: AC
  Filled 2022-10-13: qty 1

## 2022-10-13 MED ORDER — LABETALOL HCL 5 MG/ML IV SOLN
10.0000 mg | Freq: Once | INTRAVENOUS | Status: AC
  Administered 2022-10-13: 10 mg via INTRAVENOUS

## 2022-10-13 MED ORDER — FENTANYL CITRATE (PF) 250 MCG/5ML IJ SOLN
INTRAMUSCULAR | Status: DC | PRN
  Administered 2022-10-13: 50 ug via INTRAVENOUS
  Administered 2022-10-13: 100 ug via INTRAVENOUS

## 2022-10-13 MED ORDER — LABETALOL HCL 5 MG/ML IV SOLN
INTRAVENOUS | Status: AC
  Filled 2022-10-13: qty 4

## 2022-10-13 MED ORDER — SUGAMMADEX SODIUM 200 MG/2ML IV SOLN
INTRAVENOUS | Status: DC | PRN
  Administered 2022-10-13: 200 mg via INTRAVENOUS

## 2022-10-13 MED ORDER — OXYCODONE-ACETAMINOPHEN 5-325 MG PO TABS: 1.0000 | ORAL_TABLET | ORAL | 0 refills | Status: DC | PRN

## 2022-10-13 MED ORDER — SODIUM CHLORIDE 0.9 % IV SOLN: INTRAVENOUS | Status: DC

## 2022-10-13 MED ORDER — OXYCODONE HCL 5 MG PO TABS
ORAL_TABLET | ORAL | Status: AC
  Filled 2022-10-13: qty 1

## 2022-10-13 MED ORDER — FENTANYL CITRATE (PF) 100 MCG/2ML IJ SOLN
25.0000 ug | INTRAMUSCULAR | Status: DC | PRN
  Administered 2022-10-13: 25 ug via INTRAVENOUS

## 2022-10-13 MED ORDER — HEPARIN 6000 UNIT IRRIGATION SOLUTION
Status: DC | PRN
  Administered 2022-10-13: 1

## 2022-10-13 MED ORDER — ORAL CARE MOUTH RINSE: 15.0000 mL | Freq: Once | OROMUCOSAL | Status: AC

## 2022-10-13 MED ORDER — PHENYLEPHRINE 80 MCG/ML (10ML) SYRINGE FOR IV PUSH (FOR BLOOD PRESSURE SUPPORT)
PREFILLED_SYRINGE | INTRAVENOUS | Status: AC
  Filled 2022-10-13: qty 10

## 2022-10-13 MED ORDER — EPHEDRINE 5 MG/ML INJ
INTRAVENOUS | Status: AC
  Filled 2022-10-13: qty 5

## 2022-10-13 MED ORDER — ACETAMINOPHEN 500 MG PO TABS: 1000.0000 mg | ORAL_TABLET | Freq: Once | ORAL | Status: DC | PRN

## 2022-10-13 MED ORDER — DEXAMETHASONE SODIUM PHOSPHATE 10 MG/ML IJ SOLN
INTRAMUSCULAR | Status: AC
  Filled 2022-10-13: qty 1

## 2022-10-13 MED ORDER — 0.9 % SODIUM CHLORIDE (POUR BTL) OPTIME
TOPICAL | Status: DC | PRN
  Administered 2022-10-13: 1000 mL

## 2022-10-13 MED ORDER — OXYCODONE HCL 5 MG PO TABS
5.0000 mg | ORAL_TABLET | Freq: Once | ORAL | Status: AC | PRN
  Administered 2022-10-13: 5 mg via ORAL

## 2022-10-13 MED ORDER — INSULIN ASPART 100 UNIT/ML IJ SOLN: 0.0000 [IU] | INTRAMUSCULAR | Status: DC | PRN

## 2022-10-13 SURGICAL SUPPLY — 54 items
ADH SKN CLS APL DERMABOND .7 (GAUZE/BANDAGES/DRESSINGS) ×2
APL PRP STRL LF DISP 70% ISPRP (MISCELLANEOUS) ×4
APL SKNCLS STERI-STRIP NONHPOA (GAUZE/BANDAGES/DRESSINGS) ×4
ARMBAND PINK RESTRICT EXTREMIT (MISCELLANEOUS) ×2 IMPLANT
BAG COUNTER SPONGE SURGICOUNT (BAG) ×2 IMPLANT
BAG SPNG CNTER NS LX DISP (BAG) ×2
BENZOIN TINCTURE PRP APPL 2/3 (GAUZE/BANDAGES/DRESSINGS) ×2 IMPLANT
BLADE CLIPPER SURG (BLADE) IMPLANT
CANISTER SUCT 3000ML PPV (MISCELLANEOUS) ×2 IMPLANT
CANNULA VESSEL 3MM 2 BLNT TIP (CANNULA) ×2 IMPLANT
CHLORAPREP W/TINT 26 (MISCELLANEOUS) ×2 IMPLANT
CLIP LIGATING EXTRA MED SLVR (CLIP) IMPLANT
CLIP LIGATING EXTRA SM BLUE (MISCELLANEOUS) IMPLANT
COVER PROBE CYLINDRICAL 5X96 (MISCELLANEOUS) IMPLANT
COVER SURGICAL LIGHT HANDLE (MISCELLANEOUS) ×2 IMPLANT
DERMABOND ADVANCED .7 DNX12 (GAUZE/BANDAGES/DRESSINGS) ×2 IMPLANT
DISSECTOR BLUNT TIP ENDO 5MM (MISCELLANEOUS) IMPLANT
DRAPE LAPAROSCOPIC ABDOMINAL (DRAPES) IMPLANT
DRAPE ORTHO SPLIT 77X108 STRL (DRAPES) ×2
DRAPE SURG ORHT 6 SPLT 77X108 (DRAPES) IMPLANT
DRSG TEGADERM 4X4.75 (GAUZE/BANDAGES/DRESSINGS) IMPLANT
ELECT REM PT RETURN 9FT ADLT (ELECTROSURGICAL) ×2
ELECTRODE REM PT RTRN 9FT ADLT (ELECTROSURGICAL) ×2 IMPLANT
GAUZE SPONGE 2X2 8PLY STRL LF (GAUZE/BANDAGES/DRESSINGS) IMPLANT
GAUZE SPONGE 4X4 12PLY STRL (GAUZE/BANDAGES/DRESSINGS) IMPLANT
GLOVE BIO SURGEON STRL SZ8 (GLOVE) ×2 IMPLANT
GLOVE BIOGEL PI IND STRL 7.5 (GLOVE) ×2 IMPLANT
GOWN STRL REUS W/ TWL LRG LVL3 (GOWN DISPOSABLE) ×4 IMPLANT
GOWN STRL REUS W/ TWL XL LVL3 (GOWN DISPOSABLE) ×2 IMPLANT
GOWN STRL REUS W/TWL LRG LVL3 (GOWN DISPOSABLE) ×4
GOWN STRL REUS W/TWL XL LVL3 (GOWN DISPOSABLE) ×2
HEMOSTAT SNOW SURGICEL 2X4 (HEMOSTASIS) IMPLANT
INSERT FOGARTY SM (MISCELLANEOUS) IMPLANT
KIT BASIN OR (CUSTOM PROCEDURE TRAY) ×2 IMPLANT
KIT TURNOVER KIT B (KITS) ×2 IMPLANT
LOOP VESSEL MINI RED (MISCELLANEOUS) IMPLANT
NDL 18GX1X1/2 (RX/OR ONLY) (NEEDLE) IMPLANT
NEEDLE 18GX1X1/2 (RX/OR ONLY) (NEEDLE) IMPLANT
NS IRRIG 1000ML POUR BTL (IV SOLUTION) ×2 IMPLANT
PACK CV ACCESS (CUSTOM PROCEDURE TRAY) ×2 IMPLANT
PAD ARMBOARD 7.5X6 YLW CONV (MISCELLANEOUS) ×4 IMPLANT
STRIP CLOSURE SKIN 1/2X4 (GAUZE/BANDAGES/DRESSINGS) ×2 IMPLANT
SUT MNCRL AB 4-0 PS2 18 (SUTURE) IMPLANT
SUT PROLENE 6 0 BV (SUTURE) IMPLANT
SUT SILK 2 0 SH (SUTURE) IMPLANT
SUT VIC AB 3-0 SH 27 (SUTURE) ×4
SUT VIC AB 3-0 SH 27X BRD (SUTURE) ×4 IMPLANT
SUT VICRYL 4-0 PS2 18IN ABS (SUTURE) ×4 IMPLANT
TOWEL GREEN STERILE (TOWEL DISPOSABLE) ×2 IMPLANT
TOWEL GREEN STERILE FF (TOWEL DISPOSABLE) ×2 IMPLANT
TRAY LAPAROSCOPIC MC (CUSTOM PROCEDURE TRAY) ×2 IMPLANT
TROCAR XCEL 12X100 BLDLESS (ENDOMECHANICALS) ×2 IMPLANT
UNDERPAD 30X36 HEAVY ABSORB (UNDERPADS AND DIAPERS) ×2 IMPLANT
WATER STERILE IRR 1000ML POUR (IV SOLUTION) ×2 IMPLANT

## 2022-10-13 NOTE — Anesthesia Procedure Notes (Signed)
Procedure Name: Intubation Date/Time: 10/13/2022 8:43 AM  Performed by: Mariea Clonts, CRNAPre-anesthesia Checklist: Patient identified, Emergency Drugs available, Suction available and Patient being monitored Patient Re-evaluated:Patient Re-evaluated prior to induction Oxygen Delivery Method: Circle System Utilized Preoxygenation: Pre-oxygenation with 100% oxygen Induction Type: IV induction Ventilation: Mask ventilation without difficulty and Oral airway inserted - appropriate to patient size Laryngoscope Size: Mac and 4 Grade View: Grade II Tube type: Oral Tube size: 7.5 mm Number of attempts: 1 Airway Equipment and Method: Stylet and Oral airway Placement Confirmation: ETT inserted through vocal cords under direct vision, positive ETCO2 and breath sounds checked- equal and bilateral Tube secured with: Tape Dental Injury: Teeth and Oropharynx as per pre-operative assessment

## 2022-10-13 NOTE — Discharge Instructions (Addendum)
   [facility]  Discharge Instructions  AV Fistula or Graft Surgery for Dialysis Access  Please refer to the following instructions for your post-procedure care. Your surgeon or physician assistant will discuss any changes with you.  Activity  You may drive the day following your surgery, if you are comfortable and no longer taking prescription pain medication. Resume full activity as the soreness in your incision resolves.  Bathing/Showering  You may not shower if you have a hemodialysis catheter.  Incision Care  Clean your incision with mild soap and water after 48 hours. Pat the area dry with a clean towel. You do not need a bandage unless otherwise instructed. Do not apply any ointments or creams to your incision. You may have skin glue on your incision. Do not peel it off. It will come off on its own in about one week. Your arm may swell a bit after surgery. To reduce swelling use pillows to elevate your arm so it is above your heart. Your doctor will tell you if you need to lightly wrap your arm with an ACE bandage.  Diet  Resume your normal diet. There are not special food restrictions following this procedure. In order to heal from your surgery, it is CRITICAL to get adequate nutrition. Your body requires vitamins, minerals, and protein. Vegetables are the best source of vitamins and minerals. Vegetables also provide the perfect balance of protein. Processed food has little nutritional value, so try to avoid this.  Medications  Resume taking all of your medications. If your incision is causing pain, you may take over-the counter pain relievers such as acetaminophen (Tylenol). If you were prescribed a stronger pain medication, please be aware these medications can cause nausea and constipation. Prevent nausea by taking the medication with a snack or meal. Avoid constipation by drinking plenty of fluids and eating foods with high amount of fiber, such  as fruits, vegetables, and grains.  Do not take Tylenol if you are taking prescription pain medications.  Follow up Your surgeon may want to see you in the office following your access surgery. If so, this will be arranged at the time of your surgery.  Please call us immediately for any of the following conditions:  Increased pain, redness, drainage (pus) from your incision site Fever of 101 degrees or higher Severe or worsening pain at your incision site Hand pain or numbness.  Reduce your risk of vascular disease:  Stop smoking. If you would like help, call [facility] at 1-800-QUIT-NOW 7738594131) or [place] [facility] [place] your cholesterol Maintain a desired weight Control your diabetes Keep your blood pressure down  Dialysis  It will take several weeks to several months for your new dialysis access to be ready for use. Your surgeon will determine when it is okay to use it. Your nephrologist will continue to direct your dialysis. You can continue to use your Permcath until your new access is ready for use.   10/13/2022 Jose [name] [id number] 08/18/1976  Surgeon(s): [name], MD  Procedure(s): LAPAROSCOPIC REMOVAL OF CONTINUOUS AMBULATORY PERITONEAL DIALYSIS  (CAPD) CATHETER LEFT ARM ARTERIOVENOUS (AV) GRAFT CREATION   May stick graft immediately   May stick graft on designated area only:   x Do not stick fistula for 12 weeks    If you have any questions, please call the office at [phone].

## 2022-10-13 NOTE — Transfer of Care (Signed)
Immediate Anesthesia Transfer of Care Note  Patient: Jose [name]  Procedure(s) Performed: LAPAROSCOPIC REMOVAL OF CONTINUOUS AMBULATORY PERITONEAL DIALYSIS  (CAPD) CATHETER LEFT ARM ARTERIOVENOUS (AV) GRAFT CREATION (Left: Arm Upper)  Patient Location: PACU  Anesthesia Type:General  Level of Consciousness: awake, alert , and oriented  Airway & Oxygen Therapy: Patient Spontanous Breathing and Patient connected to nasal cannula oxygen  Post-op Assessment: Report given to RN, Post -op Vital signs reviewed and stable, and Patient moving all extremities X 4  Post vital signs: Reviewed and stable  Last Vitals:  Vitals Value Taken Time  BP 132/74 10/13/22 1108  Temp    Pulse 71 10/13/22 1113  Resp 0 10/13/22 1113  SpO2 98 % 10/13/22 1113  Vitals shown include unvalidated device data.  Last Pain:  Vitals:   10/13/22 0702  TempSrc: Oral  PainSc: 0-No pain         Complications: No notable events documented.

## 2022-10-13 NOTE — Interval H&P Note (Signed)
History and Physical Interval Note:  10/13/2022 8:22 AM  Jose [name]  has presented today for surgery, with the diagnosis of Peritoneal dialysis catheter dysfunction; ESRD.  The various methods of treatment have been discussed with the patient and family. After consideration of risks, benefits and other options for treatment, the patient has consented to  Procedure(s): LAPAROSCOPIC REMOVAL OF CONTINUOUS AMBULATORY PERITONEAL DIALYSIS  (CAPD) CATHETER (N/A) INSERTION OF LEFT ARM ARTERIOVENOUS (AV) GORE-TEX GRAFT (Left) as a surgical intervention.  The patient's history has been reviewed, patient examined, no change in status, stable for surgery.  I have reviewed the patient's chart and labs.  Questions were answered to the patient's satisfaction.     [name]

## 2022-10-13 NOTE — Anesthesia Postprocedure Evaluation (Signed)
Anesthesia Post Note  Patient: Jose [name]  Procedure(s) Performed: LAPAROSCOPIC REMOVAL OF CONTINUOUS AMBULATORY PERITONEAL DIALYSIS  (CAPD) CATHETER LEFT ARM ARTERIOVENOUS (AV) GRAFT CREATION (Left: Arm Upper)     Patient location during evaluation: PACU Anesthesia Type: General Level of consciousness: awake and alert Pain management: pain level controlled Vital Signs Assessment: post-procedure vital signs reviewed and stable Respiratory status: spontaneous breathing, nonlabored ventilation, respiratory function stable and patient connected to nasal cannula oxygen Cardiovascular status: blood pressure returned to baseline and stable Postop Assessment: no apparent nausea or vomiting Anesthetic complications: no  No notable events documented.  Last Vitals:  Vitals:   10/13/22 1215 10/13/22 1230  BP: (!) 174/92 (!) 170/86  Pulse: 79 83  Resp: 14 18  Temp:  36.9 C  SpO2: 98% 95%    Last Pain:  Vitals:   10/13/22 1130  TempSrc:   PainSc: 8                  Carmella Kees

## 2022-10-13 NOTE — Progress Notes (Signed)
Dr.Moser notified of patient CBG 162, and that patient took 70/30 6 units this morning. Dr.Moser states to check BS in 2 hours and to hold off any thing further.

## 2022-10-13 NOTE — Op Note (Signed)
DATE OF SERVICE: 10/13/2022  PATIENT:  Jose [name]  46 y.o. male  PRE-OPERATIVE DIAGNOSIS:  ESRD  POST-OPERATIVE DIAGNOSIS:  Same  PROCEDURE:   1) left arm first stage basilic vein arteriovenous fistula 2) removal of peritoneal dialysis catheter  SURGEON:  Surgeon(s) and Role:    * [name], MD - Primary  ASSISTANT: Vicente Serene, PA-C  An experienced assistant was required given the complexity of this procedure and the standard of surgical care. My assistant helped with exposure through counter tension, suctioning, ligation and retraction to better visualize the surgical field.  My assistant expedited sewing during the case by following my sutures. Wherever I use the term "we" in the report, my assistant actively helped me with that portion of the procedure.  ANESTHESIA:   general  EBL: minimal  BLOOD ADMINISTERED:none  DRAINS: none   LOCAL MEDICATIONS USED:  NONE  SPECIMEN:  none  COUNTS: confirmed correct.  TOURNIQUET:  none  PATIENT DISPOSITION:  PACU - hemodynamically stable.   Delay start of Pharmacological VTE agent (>24hrs) due to surgical blood loss or risk of bleeding: yes  INDICATION FOR PROCEDURE: Jose [name] is a 46 y.o. male with ESRD and malfunctioning PD catheter. After careful discussion of risks, benefits, and alternatives the patient was offered removal of PD catheter and creation of hemodialysis access.  The patient understood and wished to proceed.  OPERATIVE FINDINGS: better than expected left basilic vein. Proceeded with basilic vein fistula. PD catheter removed without difficulty.  DESCRIPTION OF PROCEDURE: After identification of the patient in the pre-operative holding area, the patient was transferred to the operating room. The patient was positioned supine on the operating room table. Anesthesia was induced. The left arm and abdomen were prepped and draped in standard fashion. A surgical pause was performed confirming correct  patient, procedure, and operative location.  Using intraoperative ultrasound the left brachial artery and basilic vein were mapped.  A curvilinear incision was planned over the course of the two vessels to allow fistula creation.  Incision was created.  Incision was carried down through subcutaneous tissue.  The aponeurosis of the biceps tendon was divided.  The brachial sheath was identified.  The brachial artery was skeletonized.  The artery was encircled with 2 Silastic Vesseloops.  Next attention was turned to the basilic vein.  This was identified in the medial arm in its typical position.  The vein was mobilized throughout the length of the incision to allow tension-free arteriovenous fistula creation.  The distal end of the vein was clamped with a right angle.  The proximal end of the vein was clamped with a bulldog.  The vein was transected distally.  The stump was oversewn with a 2-0 silk.  The cut end of the vein was spatulated and distended with a mosquito clamp.  Patient was systemically heparinized with 3000 units of IV heparin.  After a three minute pause, the brachial artery was clamped proximally distally.  The basilic vein was anastomosed to the brachial artery into side using continuous running suture of 6-0 Prolene.  Immediately prior to completion the anastomosis was flushed and de-aired.  The anastomosis was completed.  Clamps were released.  Hemostasis was achieved.  An audible bruit was heard in the fistula.  Doppler signal was heard over the left radial artery.  Stasis was achieved in the surgical bed.  The wound was closed with 3-0 Vicryl and 4-0 Monocryl.  Abdominal incisions previously made for peritoneal dialysis catheter placement were reopened  sharply.  The catheter was completely excised.  All 3 cuffs were removed.  The Christmas tree adapter was removed.  The curled into the peritoneal abdominal portion was removed.  Incisions were closed in layers using 3-0 Vicryl and 4-0  Monocryl.   Upon completion of the case instrument and sharps counts were confirmed correct. The patient was transferred to the PACU in good condition. I was present for all portions of the procedure.  [name]. Stanford Breed, MD [facility] Phone Number: [phone] 10/13/2022 10:41 AM

## 2022-10-13 NOTE — Anesthesia Preprocedure Evaluation (Signed)
Anesthesia Evaluation  Patient identified by MRN, date of birth, ID band Patient awake    Reviewed: Allergy & Precautions, NPO status , Patient's Chart, lab work & pertinent test results  History of Anesthesia Complications Negative for: history of anesthetic complications  Airway Mallampati: III  TM Distance: >3 FB Neck ROM: Full    Dental  (+) Teeth Intact, Dental Advisory Given   Pulmonary neg shortness of breath, neg sleep apnea, neg COPD, neg recent URI, former smoker   breath sounds clear to auscultation       Cardiovascular hypertension, Pt. on medications and Pt. on home beta blockers (-) angina (-) Past MI  Rhythm:Regular  1. Left ventricular ejection fraction, by estimation, is 60 to 65%. The  left ventricle has normal function. The left ventricle has no regional  wall motion abnormalities.   2. Right ventricular systolic function is normal. The right ventricular  size is normal.   3. No left atrial/left atrial appendage thrombus was detected.   4. The mitral valve is normal in structure. Trivial mitral valve  regurgitation. No evidence of mitral stenosis.   5. The aortic valve is normal in structure. Aortic valve regurgitation is  not visualized. No aortic stenosis is present.   6. The inferior vena cava is normal in size with greater than 50%  respiratory variability, suggesting right atrial pressure of 3 mmHg.   7. Evidence of atrial level shunting detected by color flow Doppler.  Agitated saline contrast bubble study was positive with shunting observed  within 3-6 cardiac cycles suggestive of interatrial shunt. There is a  small patent foramen ovale with  bidirectional shunting across atrial septum.     Neuro/Psych  Headaches PSYCHIATRIC DISORDERS Anxiety      Neuromuscular disease    GI/Hepatic Neg liver ROS, PUD,,,  Endo/Other  diabetes, Type 2, Insulin Dependent  Lab Results      Component                 Value               Date                      HGBA1C                   7.8 (H)             09/27/2022             Renal/GU ESRF and DialysisRenal diseaseLab Results      Component                Value               Date                      CREATININE               5.66 (H)            09/17/2022            Last HD yesterday      Musculoskeletal  (+) Arthritis ,    Abdominal   Peds  Hematology  (+) Blood dyscrasia, anemia Lab Results      Component                Value               Date  WBC                      12.7 (H)            09/17/2022                HGB                      8.8 (L)             09/17/2022                HCT                      26.3 (L)            09/17/2022                MCV                      91.6                09/17/2022                PLT                      195                 09/17/2022              Anesthesia Other Findings   Reproductive/Obstetrics                             Anesthesia Physical Anesthesia Plan  ASA: 3  Anesthesia Plan: General   Post-op Pain Management: Ofirmev IV (intra-op)*   Induction: Intravenous  PONV Risk Score and Plan: 2 and Ondansetron and Dexamethasone  Airway Management Planned: Oral ETT  Additional Equipment: None  Intra-op Plan:   Post-operative Plan: Extubation in OR  Informed Consent: I have reviewed the patients History and Physical, chart, labs and discussed the procedure including the risks, benefits and alternatives for the proposed anesthesia with the patient or authorized representative who has indicated his/her understanding and acceptance.     Dental advisory given  Plan Discussed with: CRNA  Anesthesia Plan Comments:        Anesthesia Quick Evaluation

## 2022-10-14 ENCOUNTER — Other Ambulatory Visit: Payer: Self-pay

## 2022-10-14 ENCOUNTER — Encounter (HOSPITAL_COMMUNITY): Payer: Self-pay | Admitting: Vascular Surgery

## 2022-10-14 ENCOUNTER — Telehealth: Payer: Self-pay

## 2022-10-14 DIAGNOSIS — N186 End stage renal disease: Secondary | ICD-10-CM

## 2022-10-14 NOTE — Telephone Encounter (Signed)
Jolayne [name], RN with [facility]) called requesting that Mr. [name] newly placed fistula be evaluate. She stated that Jose [name] is in their office and they are unable to feel or hear the fistula. Patient denies pains, loss of motion to hand or arm and/or excessive coldness. I reviewed patient's info with our in house PA Corrina Baglia, she advised to reach out to Dr. Stanford Breed to determine if the patient needs to be seen in office because of the previous discussion that took place about the type of Access Jose [name] should have. I reached out to Dr. Stanford Breed and he advised Jose [name] should follow up in office with U/S and OV to evaluate to discuss next steps. I spoke with Mr. Kiser, he is agreeable with this plan. Appt scheduled.

## 2022-10-18 NOTE — Progress Notes (Deleted)
[facility]  PROGRESS NOTE  ASSESSMENT / PLAN: Jose [name] is a 46 y.o. male with end stage renal disease dialyzing Monday, Tuesday, Thursday and Friday through tunneled line in right chest.  I have placed and revised a peritoneal dialysis catheter for him.  Recent imaging suggest the catheter is well-positioned without technical problem.  His dialysis center has not used the catheter despite instructions from Dr. Trula Slade a week ago.  Recommend trying to use the peritoneal dialysis catheter.  If this is not successful, we will transition him to hemodialysis.  He prefers access in his left arm, and so we will arrange for a left arm graft in the case that his PD catheter is nonfunctional.  SUBJECTIVE: Returns to clinic for follow-up.  Doing well overall.  PD catheter is not irritating him.  His dialysis center has not used the peritoneal dialysis catheter since his discharge from the hospital.  OBJECTIVE: There were no vitals taken for this visit.  Appearing Regular rate and rhythm Unlabored breathing Nondistended abdomen      Latest Ref Rng & Units 10/13/2022    6:50 AM 09/17/2022    1:45 AM 09/16/2022    1:24 AM  CBC  WBC 4.0 - 10.5 K/uL  12.7  19.7   Hemoglobin 13.0 - 17.0 g/dL 10.2  8.8  9.4   Hematocrit 39.0 - 52.0 % 30.0  26.3  26.9   Platelets 150 - 400 K/uL  195  227         Latest Ref Rng & Units 10/13/2022    6:50 AM 09/27/2022    1:50 PM 09/17/2022    1:45 AM  CMP  Glucose 70 - 99 mg/dL 163  114  138   BUN 6 - 20 mg/dL 18   55   Creatinine 0.61 - 1.24 mg/dL 3.40   5.66   Sodium 135 - 145 mmol/L 140   137   Potassium 3.5 - 5.1 mmol/L 4.3   3.8   Chloride 98 - 111 mmol/L 99   95   CO2 22 - 32 mmol/L   22   Calcium 8.9 - 10.3 mg/dL   8.3     Estimated Creatinine Clearance: 30.1 mL/min (A) (by C-G formula based on SCr of 3.4 mg/dL (H)).  Vein mapping shows no usable arm vein in the left arm.  His right basilic vein is of adequate  caliber for basilic vein transposition.   Jose [name]. Jose Breed, MD [facility] Phone Number: [phone] 10/18/2022 6:36 PM

## 2022-10-19 ENCOUNTER — Ambulatory Visit (HOSPITAL_COMMUNITY)
Admission: RE | Admit: 2022-10-19 | Discharge: 2022-10-19 | Disposition: A | Discharge status: Home / Self Care | Payer: 59 | Source: Ambulatory Visit | Attending: Vascular Surgery | Admitting: Vascular Surgery

## 2022-10-19 ENCOUNTER — Encounter: Payer: 59 | Admitting: Vascular Surgery

## 2022-10-19 ENCOUNTER — Ambulatory Visit (INDEPENDENT_AMBULATORY_CARE_PROVIDER_SITE_OTHER): Payer: 59 | Admitting: Vascular Surgery

## 2022-10-19 ENCOUNTER — Ambulatory Visit (HOSPITAL_COMMUNITY): Payer: 59

## 2022-10-19 ENCOUNTER — Encounter: Payer: Self-pay | Admitting: Vascular Surgery

## 2022-10-19 VITALS — BP 198/123 | HR 91 | Temp 98.0°F | Resp 20 | Ht 70.0 in | Wt 205.0 lb

## 2022-10-19 DIAGNOSIS — N186 End stage renal disease: Secondary | ICD-10-CM | POA: Diagnosis present

## 2022-10-19 NOTE — Progress Notes (Signed)
[facility] [place] PROGRESS NOTE  ASSESSMENT / PLAN: Jose [name] is a 46 y.o. male who returns to care after left brachiobasilic AVF creation 65/68/12. Unfortunately, this has thrombosed.  The patient reports large swings in his blood pressure between dialysis sessions. I am concerned that his access thrombosed because of relative hypotension during dialysis. We will refer him to nephrology to help manage his blood pressure. I will see him again in 2 months to discuss creating a left arm arteriovenous graft.   SUBJECTIVE: Left arm AVF created 10/13/22. Good technical result in good sized basilic vein. AVF thrombosed shortly after creation. Patient reports his SBP is typically 190 before dialysis. During dialysis his SBP drops to 80-100.   OBJECTIVE: BP (!) 198/123 (BP Location: Right Arm, Patient Position: Sitting, Cuff Size: Normal)   Pulse 91   Temp 98 F (36.7 C)   Resp 20   Ht 5\' 10"  (1.778 m)   Wt 205 lb (93 kg)   SpO2 98%   BMI 29.41 kg/m   No distress.  Regular rate and rhythm Unlabored breathing No thrill in left arm AVF     Latest Ref Rng & Units 10/13/2022    6:50 AM 09/17/2022    1:45 AM 09/16/2022    1:24 AM  CBC  WBC 4.0 - 10.5 K/uL  12.7  19.7   Hemoglobin 13.0 - 17.0 g/dL 10.2  8.8  9.4   Hematocrit 39.0 - 52.0 % 30.0  26.3  26.9   Platelets 150 - 400 K/uL  195  227         Latest Ref Rng & Units 10/13/2022    6:50 AM 09/27/2022    1:50 PM 09/17/2022    1:45 AM  CMP  Glucose 70 - 99 mg/dL 163  114  138   BUN 6 - 20 mg/dL 18   55   Creatinine 0.61 - 1.24 mg/dL 3.40   5.66   Sodium 135 - 145 mmol/L 140   137   Potassium 3.5 - 5.1 mmol/L 4.3   3.8   Chloride 98 - 111 mmol/L 99   95   CO2 22 - 32 mmol/L   22   Calcium 8.9 - 10.3 mg/dL   8.3     Estimated Creatinine Clearance: 30.4 mL/min (A) (by C-G formula based on SCr of 3.4 mg/dL (H)).  [name]. Stanford Breed, MD [facility] Phone  Number: [phone] 10/19/2022 5:35 PM

## 2022-10-21 ENCOUNTER — Telehealth: Payer: Self-pay | Admitting: Vascular Surgery

## 2022-10-21 NOTE — Telephone Encounter (Signed)
-----   Message from [name], MD sent at 10/13/2022 10:59 AM EST ----- Jose [name] 10/13/2022 Procedure: 1) left arm first stage basilic vein arteriovenous fistula 2) removal of peritoneal dialysis catheter  Assistant: Horton Chin Follow up: 6 weeks with PA Studies for follow up: AVF duplex  Thank you! Gershon Mussel

## 2022-10-21 NOTE — Telephone Encounter (Signed)
Appt has been scheduled.

## 2022-10-28 ENCOUNTER — Other Ambulatory Visit: Payer: Self-pay | Admitting: Endocrinology

## 2022-10-28 DIAGNOSIS — E1065 Type 1 diabetes mellitus with hyperglycemia: Secondary | ICD-10-CM

## 2022-11-01 ENCOUNTER — Other Ambulatory Visit: Payer: Self-pay

## 2022-11-01 DIAGNOSIS — E1065 Type 1 diabetes mellitus with hyperglycemia: Secondary | ICD-10-CM

## 2022-11-01 MED ORDER — BD PEN NEEDLE NANO 2ND GEN 32G X 4 MM MISC: 1 refills | Status: AC

## 2022-11-01 MED ORDER — FREESTYLE LIBRE 3 SENSOR MISC: 1.0000 | 2 refills | Status: DC

## 2022-11-18 ENCOUNTER — Other Ambulatory Visit: Payer: Self-pay | Admitting: *Deleted

## 2022-11-18 DIAGNOSIS — N186 End stage renal disease: Secondary | ICD-10-CM

## 2022-11-21 ENCOUNTER — Other Ambulatory Visit: Payer: Self-pay | Admitting: Physical Medicine and Rehabilitation

## 2022-11-30 ENCOUNTER — Ambulatory Visit (HOSPITAL_COMMUNITY): Payer: 59

## 2022-12-02 ENCOUNTER — Observation Stay (HOSPITAL_COMMUNITY)
Admission: EM | Admit: 2022-12-02 | Discharge: 2022-12-03 | Disposition: A | Discharge status: Home / Self Care | Payer: 59 | Attending: Internal Medicine | Admitting: Internal Medicine

## 2022-12-02 ENCOUNTER — Encounter (HOSPITAL_COMMUNITY): Payer: Self-pay

## 2022-12-02 ENCOUNTER — Emergency Department (HOSPITAL_COMMUNITY): Payer: 59

## 2022-12-02 DIAGNOSIS — D72829 Elevated white blood cell count, unspecified: Secondary | ICD-10-CM | POA: Insufficient documentation

## 2022-12-02 DIAGNOSIS — K920 Hematemesis: Secondary | ICD-10-CM | POA: Insufficient documentation

## 2022-12-02 DIAGNOSIS — Z79899 Other long term (current) drug therapy: Secondary | ICD-10-CM | POA: Insufficient documentation

## 2022-12-02 DIAGNOSIS — R1084 Generalized abdominal pain: Secondary | ICD-10-CM | POA: Insufficient documentation

## 2022-12-02 DIAGNOSIS — R112 Nausea with vomiting, unspecified: Principal | ICD-10-CM | POA: Insufficient documentation

## 2022-12-02 DIAGNOSIS — R197 Diarrhea, unspecified: Secondary | ICD-10-CM | POA: Insufficient documentation

## 2022-12-02 DIAGNOSIS — I120 Hypertensive chronic kidney disease with stage 5 chronic kidney disease or end stage renal disease: Secondary | ICD-10-CM | POA: Diagnosis not present

## 2022-12-02 DIAGNOSIS — E1165 Type 2 diabetes mellitus with hyperglycemia: Secondary | ICD-10-CM | POA: Diagnosis not present

## 2022-12-02 DIAGNOSIS — Z794 Long term (current) use of insulin: Secondary | ICD-10-CM | POA: Insufficient documentation

## 2022-12-02 DIAGNOSIS — N186 End stage renal disease: Secondary | ICD-10-CM | POA: Insufficient documentation

## 2022-12-02 DIAGNOSIS — E1122 Type 2 diabetes mellitus with diabetic chronic kidney disease: Secondary | ICD-10-CM | POA: Insufficient documentation

## 2022-12-02 DIAGNOSIS — K529 Noninfective gastroenteritis and colitis, unspecified: Secondary | ICD-10-CM | POA: Diagnosis not present

## 2022-12-02 DIAGNOSIS — E1140 Type 2 diabetes mellitus with diabetic neuropathy, unspecified: Secondary | ICD-10-CM | POA: Insufficient documentation

## 2022-12-02 DIAGNOSIS — Z992 Dependence on renal dialysis: Secondary | ICD-10-CM | POA: Insufficient documentation

## 2022-12-02 DIAGNOSIS — Z87891 Personal history of nicotine dependence: Secondary | ICD-10-CM | POA: Diagnosis not present

## 2022-12-02 DIAGNOSIS — I10 Essential (primary) hypertension: Secondary | ICD-10-CM | POA: Diagnosis present

## 2022-12-02 HISTORY — DX: Dependence on renal dialysis: N18.6

## 2022-12-02 HISTORY — DX: Gastroparesis: K31.84

## 2022-12-02 HISTORY — DX: Dependence on renal dialysis: Z99.2

## 2022-12-02 LAB — CBC WITH DIFFERENTIAL/PLATELET
Abs Immature Granulocytes: 0.08 10*3/uL — ABNORMAL HIGH (ref 0.00–0.07)
Basophils Absolute: 0 10*3/uL (ref 0.0–0.1)
Basophils Relative: 0 %
Eosinophils Absolute: 0 10*3/uL (ref 0.0–0.5)
Eosinophils Relative: 0 %
HCT: 36.5 % — ABNORMAL LOW (ref 39.0–52.0)
Hemoglobin: 13.2 g/dL (ref 13.0–17.0)
Immature Granulocytes: 0 %
Lymphocytes Relative: 8 %
Lymphs Abs: 1.4 10*3/uL (ref 0.7–4.0)
MCH: 31.1 pg (ref 26.0–34.0)
MCHC: 36.2 g/dL — ABNORMAL HIGH (ref 30.0–36.0)
MCV: 86.1 fL (ref 80.0–100.0)
Monocytes Absolute: 1.7 10*3/uL — ABNORMAL HIGH (ref 0.1–1.0)
Monocytes Relative: 9 %
Neutro Abs: 15.8 10*3/uL — ABNORMAL HIGH (ref 1.7–7.7)
Neutrophils Relative %: 83 %
Platelets: 228 10*3/uL (ref 150–400)
RBC: 4.24 MIL/uL (ref 4.22–5.81)
RDW: 12 % (ref 11.5–15.5)
WBC: 19 10*3/uL — ABNORMAL HIGH (ref 4.0–10.5)
nRBC: 0 % (ref 0.0–0.2)

## 2022-12-02 LAB — I-STAT VENOUS BLOOD GAS, ED
Acid-Base Excess: 3 mmol/L — ABNORMAL HIGH (ref 0.0–2.0)
Bicarbonate: 28 mmol/L (ref 20.0–28.0)
Calcium, Ion: 1.09 mmol/L — ABNORMAL LOW (ref 1.15–1.40)
HCT: 32 % — ABNORMAL LOW (ref 39.0–52.0)
Hemoglobin: 10.9 g/dL — ABNORMAL LOW (ref 13.0–17.0)
O2 Saturation: 87 %
Potassium: 3.6 mmol/L (ref 3.5–5.1)
Sodium: 133 mmol/L — ABNORMAL LOW (ref 135–145)
TCO2: 29 mmol/L (ref 22–32)
pCO2, Ven: 43.9 mmHg — ABNORMAL LOW (ref 44–60)
pH, Ven: 7.412 (ref 7.25–7.43)
pO2, Ven: 53 mmHg — ABNORMAL HIGH (ref 32–45)

## 2022-12-02 LAB — COMPREHENSIVE METABOLIC PANEL
ALT: 19 U/L (ref 0–44)
AST: 23 U/L (ref 15–41)
Albumin: 3.8 g/dL (ref 3.5–5.0)
Alkaline Phosphatase: 108 U/L (ref 38–126)
Anion gap: 16 — ABNORMAL HIGH (ref 5–15)
BUN: 62 mg/dL — ABNORMAL HIGH (ref 6–20)
CO2: 28 mmol/L (ref 22–32)
Calcium: 8.9 mg/dL (ref 8.9–10.3)
Chloride: 89 mmol/L — ABNORMAL LOW (ref 98–111)
Creatinine, Ser: 4.44 mg/dL — ABNORMAL HIGH (ref 0.61–1.24)
GFR, Estimated: 16 mL/min — ABNORMAL LOW (ref >60)
Glucose, Bld: 306 mg/dL — ABNORMAL HIGH (ref 70–99)
Potassium: 3.6 mmol/L (ref 3.5–5.1)
Sodium: 133 mmol/L — ABNORMAL LOW (ref 135–145)
Total Bilirubin: 1.2 mg/dL (ref 0.3–1.2)
Total Protein: 7.1 g/dL (ref 6.5–8.1)

## 2022-12-02 LAB — HEMOGLOBIN AND HEMATOCRIT, BLOOD
HCT: 31.5 % — ABNORMAL LOW (ref 39.0–52.0)
HCT: 30.4 % — ABNORMAL LOW (ref 39.0–52.0)
Hemoglobin: 11.4 g/dL — ABNORMAL LOW (ref 13.0–17.0)
Hemoglobin: 11.1 g/dL — ABNORMAL LOW (ref 13.0–17.0)

## 2022-12-02 LAB — BETA-HYDROXYBUTYRIC ACID: Beta-Hydroxybutyric Acid: 1.13 mmol/L — ABNORMAL HIGH (ref 0.05–0.27)

## 2022-12-02 LAB — GLUCOSE, CAPILLARY
Glucose-Capillary: 213 mg/dL — ABNORMAL HIGH (ref 70–99)
Glucose-Capillary: 165 mg/dL — ABNORMAL HIGH (ref 70–99)

## 2022-12-02 LAB — LIPASE, BLOOD: Lipase: 60 U/L — ABNORMAL HIGH (ref 11–51)

## 2022-12-02 LAB — TYPE AND SCREEN
ABO/RH(D): A POS
Antibody Screen: NEGATIVE

## 2022-12-02 LAB — CBG MONITORING, ED: Glucose-Capillary: 269 mg/dL — ABNORMAL HIGH (ref 70–99)

## 2022-12-02 LAB — LACTIC ACID, PLASMA
Lactic Acid, Venous: 1.3 mmol/L (ref 0.5–1.9)
Lactic Acid, Venous: 1.1 mmol/L (ref 0.5–1.9)

## 2022-12-02 MED ORDER — VITAMIN D 25 MCG (1000 UNIT) PO TABS
1000.0000 [IU] | ORAL_TABLET | Freq: Every day | ORAL | Status: DC
  Administered 2022-12-03: 1000 [IU] via ORAL
  Filled 2022-12-02 (×2): qty 1

## 2022-12-02 MED ORDER — HYDROXYZINE HCL 25 MG PO TABS: 25.0000 mg | ORAL_TABLET | Freq: Three times a day (TID) | ORAL | Status: DC | PRN

## 2022-12-02 MED ORDER — SODIUM CHLORIDE 0.9 % IV SOLN
25.0000 mg | Freq: Once | INTRAVENOUS | Status: AC
  Administered 2022-12-02: 25 mg via INTRAVENOUS
  Filled 2022-12-02: qty 1

## 2022-12-02 MED ORDER — SODIUM CHLORIDE 0.9 % IV SOLN: INTRAVENOUS | Status: DC

## 2022-12-02 MED ORDER — INSULIN GLARGINE-YFGN 100 UNIT/ML ~~LOC~~ SOLN
20.0000 [IU] | Freq: Every day | SUBCUTANEOUS | Status: DC
  Administered 2022-12-02 – 2022-12-03 (×2): 20 [IU] via SUBCUTANEOUS
  Filled 2022-12-02 (×2): qty 0.2

## 2022-12-02 MED ORDER — GABAPENTIN 100 MG PO CAPS
200.0000 mg | ORAL_CAPSULE | Freq: Two times a day (BID) | ORAL | Status: DC
  Administered 2022-12-02 – 2022-12-03 (×2): 200 mg via ORAL
  Filled 2022-12-02 (×2): qty 2

## 2022-12-02 MED ORDER — METOCLOPRAMIDE HCL 5 MG PO TABS
5.0000 mg | ORAL_TABLET | Freq: Four times a day (QID) | ORAL | Status: DC | PRN
  Administered 2022-12-02: 5 mg via ORAL
  Filled 2022-12-02: qty 1

## 2022-12-02 MED ORDER — MIRTAZAPINE 15 MG PO TABS
15.0000 mg | ORAL_TABLET | Freq: Every day | ORAL | Status: DC
  Administered 2022-12-02: 15 mg via ORAL
  Filled 2022-12-02: qty 1

## 2022-12-02 MED ORDER — SODIUM CHLORIDE 0.9 % IV BOLUS
500.0000 mL | Freq: Once | INTRAVENOUS | Status: AC
  Administered 2022-12-02: 500 mL via INTRAVENOUS

## 2022-12-02 MED ORDER — ALPRAZOLAM 0.25 MG PO TABS
0.2500 mg | ORAL_TABLET | Freq: Two times a day (BID) | ORAL | Status: DC | PRN
  Administered 2022-12-02: 0.25 mg via ORAL
  Filled 2022-12-02: qty 1

## 2022-12-02 MED ORDER — PROCHLORPERAZINE MALEATE 5 MG PO TABS: 5.0000 mg | ORAL_TABLET | Freq: Three times a day (TID) | ORAL | Status: DC | PRN

## 2022-12-02 MED ORDER — METOPROLOL TARTRATE 25 MG PO TABS
75.0000 mg | ORAL_TABLET | Freq: Two times a day (BID) | ORAL | Status: DC
  Administered 2022-12-02 – 2022-12-03 (×2): 75 mg via ORAL
  Filled 2022-12-02 (×2): qty 3

## 2022-12-02 MED ORDER — SEVELAMER CARBONATE 800 MG PO TABS
800.0000 mg | ORAL_TABLET | Freq: Three times a day (TID) | ORAL | Status: DC
  Administered 2022-12-03 (×2): 800 mg via ORAL
  Filled 2022-12-02 (×3): qty 1

## 2022-12-02 MED ORDER — ONDANSETRON HCL 4 MG/2ML IJ SOLN: 4.0000 mg | Freq: Four times a day (QID) | INTRAMUSCULAR | Status: DC | PRN

## 2022-12-02 MED ORDER — TRAZODONE HCL 50 MG PO TABS
100.0000 mg | ORAL_TABLET | Freq: Every day | ORAL | Status: DC
  Administered 2022-12-02: 100 mg via ORAL
  Filled 2022-12-02: qty 2

## 2022-12-02 MED ORDER — ALBUTEROL SULFATE (2.5 MG/3ML) 0.083% IN NEBU: 3.0000 mL | INHALATION_SOLUTION | RESPIRATORY_TRACT | Status: DC | PRN

## 2022-12-02 MED ORDER — TAMSULOSIN HCL 0.4 MG PO CAPS
0.4000 mg | ORAL_CAPSULE | Freq: Every day | ORAL | Status: DC
  Administered 2022-12-03: 0.4 mg via ORAL
  Filled 2022-12-02 (×2): qty 1

## 2022-12-02 MED ORDER — INSULIN ASPART 100 UNIT/ML IJ SOLN
0.0000 [IU] | Freq: Three times a day (TID) | INTRAMUSCULAR | Status: DC
  Administered 2022-12-02: 2 [IU] via SUBCUTANEOUS

## 2022-12-02 MED ORDER — OXYCODONE-ACETAMINOPHEN 5-325 MG PO TABS: 1.0000 | ORAL_TABLET | ORAL | Status: DC | PRN

## 2022-12-02 MED ORDER — INSULIN ASPART 100 UNIT/ML IJ SOLN: 10.0000 [IU] | Freq: Once | INTRAMUSCULAR | Status: DC

## 2022-12-02 MED ORDER — PAROXETINE HCL 20 MG PO TABS
20.0000 mg | ORAL_TABLET | Freq: Every day | ORAL | Status: DC
  Administered 2022-12-02: 20 mg via ORAL
  Filled 2022-12-02 (×2): qty 1

## 2022-12-02 MED ORDER — PANTOPRAZOLE SODIUM 40 MG PO TBEC
40.0000 mg | DELAYED_RELEASE_TABLET | Freq: Every day | ORAL | Status: DC
  Administered 2022-12-03: 40 mg via ORAL
  Filled 2022-12-02 (×2): qty 1

## 2022-12-02 MED ORDER — PANTOPRAZOLE SODIUM 40 MG IV SOLR
40.0000 mg | Freq: Once | INTRAVENOUS | Status: AC
  Administered 2022-12-02: 40 mg via INTRAVENOUS
  Filled 2022-12-02: qty 10

## 2022-12-02 MED ORDER — ACETAMINOPHEN 500 MG PO TABS: 500.0000 mg | ORAL_TABLET | Freq: Four times a day (QID) | ORAL | Status: DC | PRN

## 2022-12-02 MED ORDER — POLYSACCHARIDE IRON COMPLEX 150 MG PO CAPS
150.0000 mg | ORAL_CAPSULE | Freq: Every day | ORAL | Status: DC
  Administered 2022-12-03: 150 mg via ORAL
  Filled 2022-12-02 (×2): qty 1

## 2022-12-02 MED ORDER — HYDRALAZINE HCL 50 MG PO TABS
50.0000 mg | ORAL_TABLET | Freq: Three times a day (TID) | ORAL | Status: DC
  Administered 2022-12-02 – 2022-12-03 (×4): 50 mg via ORAL
  Filled 2022-12-02 (×4): qty 1

## 2022-12-02 MED ORDER — CHLORHEXIDINE GLUCONATE CLOTH 2 % EX PADS
6.0000 | MEDICATED_PAD | Freq: Every day | CUTANEOUS | Status: DC
  Administered 2022-12-03: 6 via TOPICAL

## 2022-12-02 MED ORDER — ATORVASTATIN CALCIUM 40 MG PO TABS
40.0000 mg | ORAL_TABLET | Freq: Every day | ORAL | Status: DC
  Administered 2022-12-03: 40 mg via ORAL
  Filled 2022-12-02 (×2): qty 1

## 2022-12-02 MED ORDER — ONDANSETRON HCL 4 MG/2ML IJ SOLN
4.0000 mg | Freq: Once | INTRAMUSCULAR | Status: AC
  Administered 2022-12-02: 4 mg via INTRAVENOUS
  Filled 2022-12-02: qty 2

## 2022-12-02 NOTE — ED Triage Notes (Signed)
Pt reports N/V/D , chills, weakness, decreased PO intake, dizziness since Monday/Tuesday. Today he noticed dark red blood in emesis and says last time this happened (in December) he needed a blood transfusion. Had dialysis on Tuesday and due for dialysis today.

## 2022-12-02 NOTE — ED Notes (Signed)
Report given to floor, pt sleeping confirmed chest rise and fall. Will put in for transport

## 2022-12-02 NOTE — ED Notes (Signed)
Pt transported to imaging.

## 2022-12-02 NOTE — H&P (Signed)
History and Physical    [name] BPZ:[id number] DOB: [birth year] DOA: 12/02/2022  PCP: Roddie Mc, NP (Confirm with patient/family/NH records and if not entered, this has to be entered at [facility] point of entry) Patient coming from: Home  I have personally briefly reviewed patient's old medical records in [name]  Chief Complaint: Nauseous vomiting diarrhea  HPI: Jose [name] is a 47 y.o. 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  Symptoms started Tuesday, became sick after normal dialysis session.  Symptoms involved nauseous vomiting epigastric cramping-like pain and diarrhea.  Vomiting has been constant about 10 times a day, diarrhea 4-5 times a day with loose bowel movement denies any tenesmus denies any fever or chills.  He tried some OTC medications with no significant improvement.  Unable to eat or drink whole day yesterday, patient became very dehydrated and then started to feel lightheadedness since last night.  He also reported that the process might related to  "stomach virus "which he might contracted on Monday statuses as she said lady next to him on Monday's HD went home with similar symptoms next day as he had.  This morning, patient vomited 2 times of clear stomach fluid with streaks of blood no blood, no coffee-ground stuff.  Denies any blood in the diarrhea or melena.  However it seems that both his vomiting and diarrhea stopped after he arrived in the ED.  Although he still feels severe nausea at this point. ED Course: Borderline tachycardia no hypotension afebrile.  CT abdomen pelvis showed no significant acute findings with minimum cholelithiasis.  Blood work showed WBC 19, hemoglobin 13.2, creatinine 4.4, BUN  creatinine 62, K3.8.  Review of Systems: As per HPI otherwise 14 point review of systems negative.    Past  Medical History:  Diagnosis Date   Anemia    ESRD on hemodialysis ([place])    started in Oct 2023, HD at [facility]   Family history of adverse reaction to anesthesia    daughter age 51 months , coded after recieving 1 injection  for surgery - an inhalation med and morphine   GAD (generalized anxiety disorder)    Gastroparesis    Headache    Hyperlipidemia    Hypertension    Macular degeneration, bilateral    Pneumonia    Retinopathy    Type II diabetes mellitus with complication, uncontrolled    retinopathy, neuropathy, microalbuminuria    Past Surgical History:  Procedure Laterality Date   APPENDECTOMY     AV FISTULA PLACEMENT Left 10/13/2022   Procedure: LEFT ARM ARTERIOVENOUS (AV) GRAFT CREATION;  Surgeon: [name], MD;  Location: [place];  Service: Vascular;  Laterality: Left;   BIOPSY  08/10/2021   Procedure: BIOPSY;  Surgeon: Otis Brace, MD;  Location: [place];  Service: Gastroenterology;;   BUBBLE STUDY  07/29/2021   Procedure: BUBBLE STUDY;  Surgeon: Jerline Pain, MD;  Location: [place];  Service: Cardiovascular;;   CAPD INSERTION N/A 08/25/2022   Procedure: LAPAROSCOPIC INSERTION CONTINUOUS AMBULATORY PERITONEAL DIALYSIS  (CAPD) CATHETER WITH OMENTOPEXY;  Surgeon: [name], MD;  Location: [place];  Service: Vascular;  Laterality: N/A;   CAPD REMOVAL N/A 10/13/2022   Procedure: LAPAROSCOPIC REMOVAL OF CONTINUOUS AMBULATORY PERITONEAL DIALYSIS  (CAPD) CATHETER;  Surgeon: [name], MD;  Location: [facility];  Service: Vascular;  Laterality: N/A;   ESOPHAGOGASTRODUODENOSCOPY (EGD) WITH PROPOFOL  N/A 08/10/2021   Procedure: ESOPHAGOGASTRODUODENOSCOPY (EGD) WITH PROPOFOL;  Surgeon: Otis Brace, MD;  Location: [facility];  Service: Gastroenterology;  Laterality: N/A;   ESOPHAGOGASTRODUODENOSCOPY (EGD) WITH PROPOFOL N/A 08/24/2021   Procedure: ESOPHAGOGASTRODUODENOSCOPY (EGD) WITH PROPOFOL;  Surgeon: Otis Brace, MD;  Location: [place];  Service: Gastroenterology;  Laterality: N/A;   HERNIA REPAIR     IR FLUORO GUIDED NEEDLE PLC ASPIRATION/INJECTION LOC  07/28/2021   LUMBAR LAMINECTOMY/DECOMPRESSION MICRODISCECTOMY N/A 08/07/2021   Procedure: THORACIC FOUR - THORACIC FIVE LAMINECTOMY/DECOMPRESSION OF SPINAL CORD, DEBRIDEMENT OF ABSCESS, MICRODISCECTOMY, INTRAOPERATIVE ULTRASOUND;  Surgeon: Consuella Lose, MD;  Location: [place];  Service: Neurosurgery;  Laterality: N/A;   TEE WITHOUT CARDIOVERSION N/A 07/29/2021   Procedure: TRANSESOPHAGEAL ECHOCARDIOGRAM (TEE);  Surgeon: Jerline Pain, MD;  Location: [facility] END[facility]COPY;  Service: Cardiovascular;  Laterality: N/A;   TRIGGER FINGER RELEASE Right 10/25/2019   Procedure: RIGHT INDEX FINGER RELEASE TRIGGER FINGER/A-1 PULLEY;  Surgeon: Daryll Brod, MD;  Location: [place];  Service: Orthopedics;  Laterality: Right;  IV REGIONAL FOREARM BLOCK     reports that he quit smoking about 2 years ago. His smoking use included cigarettes. He has a 20.00 pack-year smoking history. He has never used smokeless tobacco. He reports that he does not currently use alcohol. He reports that he does not use drugs.  Allergies  Allergen Reactions   Cranberry Itching   Hm Lidocaine Patch [Lidocaine] Dermatitis    Blisters skin    Melatonin Other (See Comments)    nightmares   Nsaids Other (See Comments)    Stage 3 kidney disease   Trulicity [Dulaglutide] Other (See Comments)    Night sweats  Uncontrolled tempeture     Family History  Problem Relation Age of Onset   Diabetes Mother    Hyperlipidemia Mother    Stroke Mother    Diabetes Father    Hyperlipidemia Brother    Stroke Brother    ADD / ADHD Brother    ADD / ADHD Son      Prior to Admission medications   Medication Sig Start Date End Date Taking? Authorizing Provider  acetaminophen (TYLENOL) 500 MG tablet Take 500-1,000 mg by mouth every 6 (six) hours as needed for mild pain.    [provider]   albuterol (VENTOLIN HFA) 108 (90 Base) MCG/ACT inhaler Inhale 1 puff into the lungs every 4 (four) hours as needed for wheezing or shortness of breath. 05/27/21   [provider]  ALPRAZolam Duanne Moron) 0.25 MG tablet Take 0.25 mg by mouth 2 (two) times daily as needed for anxiety. 07/22/22   [provider]  atorvastatin (LIPITOR) 40 MG tablet Take 40 mg by mouth daily.    [provider]  cholecalciferol (VITAMIN D3) 25 MCG (1000 UNIT) tablet Take 1,000 Units by mouth daily.    [provider]  Continuous Blood Gluc Sensor (FREESTYLE LIBRE 3 SENSOR) MISC 1 Device by Does not apply route every 14 (fourteen) days. Apply 1 sensor on upper arm every 14 days for continuous glucose monitoring 11/01/22   Elayne Snare, MD  diclofenac Sodium (VOLTAREN) 1 % GEL Apply 1 Application topically 2 (two) times daily as needed (shoulder pain).    [provider]  gabapentin (NEURONTIN) 100 MG capsule Take 2 capsules (200 mg total) by mouth 2 (two) times daily. 09/21/21   Love, Ivan Anchors, PA-C  hydrALAZINE (APRESOLINE) 50 MG tablet Take 1 tablet (50 mg total) by mouth every 8 (eight) hours. Patient taking differently: Take 50  mg by mouth See admin instructions. On dialysis days Mon, Tues, Thurs and Fri 50 mg once daily, all other days will take 50 mg 3 times daily 06/08/22   Elgergawy, Silver Huguenin, MD  hydrOXYzine (ATARAX) 25 MG tablet TAKE 1 TABLET(25 MG) BY MOUTH THREE TIMES DAILY AS NEEDED FOR ANXIETY OR NAUSEA 11/25/22   Lovorn, Jinny Blossom, MD  insulin glargine, 1 Unit Dial, (TOUJEO SOLOSTAR) 300 UNIT/ML Solostar Pen Inject 20 Units into the skin daily. Adjust as directed Patient taking differently: Inject 12-14 Units into the skin See admin instructions. 12 units in the morning, 14 units at bedtime 10/04/22   Elayne Snare, MD  insulin lispro (HUMALOG KWIKPEN) 100 UNIT/ML KwikPen Inject 10 Units into the skin 3 (three) times daily. Patient taking differently: Inject 2 Units into the skin 3  (three) times daily before meals. 10/04/22   Elayne Snare, MD  Insulin Pen Needle (BD PEN NEEDLE NANO 2ND GEN) 32G X 4 MM MISC USE AS DIRECTED FOUR TIMES A DAY. 11/01/22   Elayne Snare, MD  iron polysaccharides (NIFEREX) 150 MG capsule Take 1 capsule (150 mg total) by mouth daily. Patient taking differently: Take 150 mg by mouth 4 (four) times a week. On Dialysis Days Mon, Tues, Thurs, Fri 07/06/22   Raiford Noble Latif, DO  metoCLOPramide (REGLAN) 5 MG tablet Take 5 mg by mouth in the morning and at bedtime.    [provider]  Metoprolol Tartrate 75 MG TABS Take 75 mg by mouth 2 (two) times daily. 06/08/22   Elgergawy, Silver Huguenin, MD  mirtazapine (REMERON) 15 MG tablet Take 1 tablet (15 mg total) by mouth at bedtime. 09/21/21   Love, Ivan Anchors, PA-C  oxyCODONE-acetaminophen (PERCOCET) 5-325 MG tablet Take 1 tablet by mouth every 4 (four) hours as needed for severe pain. 10/13/22 10/13/23  Schuh, McKenzi P, PA-C  pantoprazole (PROTONIX) 40 MG tablet Take 1 tablet (40 mg total) by mouth daily. 07/06/22   Raiford Noble Latif, DO  PARoxetine (PAXIL) 20 MG tablet Take 1 tablet (20 mg total) by mouth at bedtime. 09/21/21   Love, Ivan Anchors, PA-C  polyethylene glycol (MIRALAX / GLYCOLAX) 17 g packet Take 17 g by mouth daily as needed for moderate constipation, mild constipation or severe constipation. 08/29/21   Terrilee Croak, MD  prochlorperazine (COMPAZINE) 5 MG tablet Take 1 tablet (5 mg total) by mouth every 8 (eight) hours as needed for nausea or vomiting. If having nausea despite Reglan 09/17/22   Lowry Ram, MD  sevelamer carbonate (RENVELA) 800 MG tablet Take 800 mg by mouth 3 (three) times daily with meals. 08/12/22   [provider]  tamsulosin (FLOMAX) 0.4 MG CAPS capsule Take 1 capsule (0.4 mg total) by mouth daily after supper. 09/21/21   Love, Ivan Anchors, PA-C  torsemide 40 MG TABS Take 40 mg by mouth daily. 07/06/22   Raiford Noble Latif, DO  traZODone (DESYREL) 100 MG tablet Take 100  mg by mouth at bedtime.    [provider]    Physical Exam: Vitals:   12/02/22 1230 12/02/22 1341 12/02/22 1400 12/02/22 1438  BP: 128/73 (!) 144/81 (!) 154/78   Pulse: 93 95 93   Resp: 16 (!) 21 17   Temp:    98.3 F (36.8 C)  TempSrc:    Oral  SpO2: 95% 96% 95%     Constitutional: NAD, calm, comfortable Vitals:   12/02/22 1230 12/02/22 1341 12/02/22 1400 12/02/22 1438  BP: 128/73 (!) 144/81 (!) 154/78  Pulse: 93 95 93   Resp: 16 (!) 21 17   Temp:    98.3 F (36.8 C)  TempSrc:    Oral  SpO2: 95% 96% 95%    Eyes: PERRL, lids and conjunctivae normal ENMT: Mucous membranes are dry. Posterior pharynx clear of any exudate or lesions.Normal dentition.  Neck: normal, supple, no masses, no thyromegaly Respiratory: clear to auscultation bilaterally, no wheezing, no crackles. Normal respiratory effort. No accessory muscle use.  Cardiovascular: Regular rate and rhythm, no murmurs / rubs / gallops. No extremity edema. 2+ pedal pulses. No carotid bruits.  Abdomen: mild tenderness on periumbilical area no rebound no guarding, no masses palpated. No hepatosplenomegaly. Bowel sounds positive.  Musculoskeletal: no clubbing / cyanosis. No joint deformity upper and lower extremities. Good ROM, no contractures. Normal muscle tone.  Skin: no rashes, lesions, ulcers. No induration Neurologic: CN 2-12 grossly intact. Sensation intact, DTR normal. Strength 5/5 in all 4.  Psychiatric: Normal judgment and insight. Alert and oriented x 3. Normal mood.   ( Labs on Admission: I have personally reviewed following labs and imaging studies  CBC: Recent Labs  Lab 12/02/22 0910 12/02/22 1248  WBC 19.0*  --   NEUTROABS 15.8*  --   HGB 13.2 10.9*  HCT 36.5* 32.0*  MCV 86.1  --   PLT 228  --    Basic Metabolic Panel: Recent Labs  Lab 12/02/22 0910 12/02/22 1248  NA 133* 133*  K 3.6 3.6  CL 89*  --   CO2 28  --   GLUCOSE 306*  --   BUN 62*  --   CREATININE 4.44*  --   CALCIUM  8.9  --    GFR: CrCl cannot be calculated (Unknown ideal weight.). Liver Function Tests: Recent Labs  Lab 12/02/22 0910  AST 23  ALT 19  ALKPHOS 108  BILITOT 1.2  PROT 7.1  ALBUMIN 3.8   Recent Labs  Lab 12/02/22 0910  LIPASE 60*   No results for input(s): "AMMONIA" in the last 168 hours. Coagulation Profile: No results for input(s): "INR", "PROTIME" in the last 168 hours. Cardiac Enzymes: No results for input(s): "CKTOTAL", "CKMB", "CKMBINDEX", "TROPONINI" in the last 168 hours. BNP (last 3 results) No results for input(s): "PROBNP" in the last 8760 hours. HbA1C: No results for input(s): "HGBA1C" in the last 72 hours. CBG: Recent Labs  Lab 12/02/22 1442  GLUCAP 269*   Lipid Profile: No results for input(s): "CHOL", "HDL", "LDLCALC", "TRIG", "CHOLHDL", "LDLDIRECT" in the last 72 hours. Thyroid Function Tests: No results for input(s): "TSH", "T4TOTAL", "FREET4", "T3FREE", "THYROIDAB" in the last 72 hours. Anemia Panel: No results for input(s): "VITAMINB12", "FOLATE", "FERRITIN", "TIBC", "IRON", "RETICCTPCT" in the last 72 hours. Urine analysis:    Component Value Date/Time   COLORURINE YELLOW 08/19/2022 2010   APPEARANCEUR CLEAR 08/19/2022 2010   APPEARANCEUR Clear 05/05/2018 1211   LABSPEC 1.017 08/19/2022 2010   PHURINE 5.0 08/19/2022 2010   GLUCOSEU 150 (A) 08/19/2022 2010   HGBUR NEGATIVE 08/19/2022 2010   BILIRUBINUR NEGATIVE 08/19/2022 2010   BILIRUBINUR negative 09/12/2018 0919   BILIRUBINUR Negative 05/05/2018 1211   [place] 08/19/2022 2010   PROTEINUR >=300 (A) 08/19/2022 2010   UROBILINOGEN 1.0 09/12/2018 0919   NITRITE NEGATIVE 08/19/2022 2010   LEUKOCYTESUR NEGATIVE 08/19/2022 2010    Radiological Exams on Admission: DG Chest Port 1 View  Result Date: 12/02/2022 CLINICAL DATA:  Dialysis patient with nausea, vomiting and diarrhea. EXAM: PORTABLE CHEST 1 VIEW COMPARISON:  Radiographs 09/15/2022  and 08/19/2022.  CT 08/19/2022.  FINDINGS: 1147 hours. Slightly lower lung volumes. Right IJ hemodialysis catheter is unchanged at the level of the mid right atrium. The heart size and mediastinal contours are stable. The lungs are clear. There is no pleural effusion or pneumothorax. No acute osseous findings are demonstrated. There are grossly stable chronic deformities in the upper thoracic spine. Telemetry leads overlie the chest. IMPRESSION: No evidence of acute cardiopulmonary process. Right IJ hemodialysis catheter unchanged in position. Electronically Signed   By: Richardean Sale M.D.   On: 12/02/2022 12:47   CT ABDOMEN PELVIS WO CONTRAST  Result Date: 12/02/2022 CLINICAL DATA:  Acute abdominal pain. EXAM: CT ABDOMEN AND PELVIS WITHOUT CONTRAST TECHNIQUE: Multidetector CT imaging of the abdomen and pelvis was performed following the standard protocol without IV contrast. RADIATION DOSE REDUCTION: This exam was performed according to the departmental dose-optimization program which includes automated exposure control, adjustment of the mA and/or kV according to patient size and/or use of iterative reconstruction technique. COMPARISON:  09/13/2022 FINDINGS: Lower chest: Lung bases are clear. Patient is dialysis catheter tip over the right atrium unchanged. Hepatobiliary: Minimal cholelithiasis. Liver and biliary tree are normal. Pancreas: Normal. Spleen: Normal. Adrenals/Urinary Tract: Adrenal glands are normal. Kidneys are normal in size without hydronephrosis or nephrolithiasis. Ureters and bladder normal. Stomach/Bowel: Stomach and small bowel are normal. Appendix not visualized. Colon is normal. Vascular/Lymphatic: Noncalcified plaque of the abdominal aorta. Aorta is normal in caliber. No adenopathy. Reproductive: Normal. Other: Interval removal of peritoneal dialysis catheter. No free fluid or focal inflammatory change. Musculoskeletal: No focal abnormality. IMPRESSION: 1. No acute findings in the abdomen/pelvis. Interval removal  peritoneal dialysis catheter. 2. Minimal cholelithiasis. 3. Aortic atherosclerosis. Aortic Atherosclerosis (ICD10-I70.0). Electronically Signed   By: Marin Olp M.D.   On: 12/02/2022 11:28    EKG: Independently reviewed.  Sinus rhythm, borderline prolonged QTc, no acute ST changes.  Assessment/Plan Principal Problem:   Nausea & vomiting Active Problems:   ESRD (end stage renal disease) on dialysis [facility])   Essential hypertension   Gastroenteritis   Hematemesis  (please populate well all problems here in Problem List. (For example, if patient is on BP meds at home and you resume or decide to hold them, it is a problem that needs to be her. Same for CAD, COPD, HLD and so on)  Hematemesis -Medically suspect mucous membrane damage from repeated retching and vomiting, small amount history of bleeding are blood streaks mixed with mucus and stomach content.  Recheck H&H this afternoon tonight transfuse if significant hemoglobin drop -Hemoglobin count appears to be hemoconcentrated, recheck level.  Clinical process appears to be self-limiting as there is no more vomiting since this morning.  Intractable nauseous vomiting, and severe dehydration -Probably viral gastroenteritis, appears to be self-limiting -Hold off further GI workup for now -Symptomatic management Zofran mild IV fluid, agreed with nephrology  IDDM with hyperglycemia -With probably mild starvation ketoacidosis -1 dose of IV insulin received in ED -Resume home dosage of Lantus and sliding scale  Leukocytosis -Probably reactive, no cough no fever or chills, no infiltrates on x-ray and GI symptoms appear to be gastroenteritis and self-limiting.  Hold off antibiotics -Blood culture sent, will follow.   Diabetic neuropathy -Continue gabapentin and as needed narcotics  Anxiety/depression -Continue Paxil and as needed Xanax    DVT prophylaxis: SSCD Code Status: Full code Family Communication: None at bedside Disposition  Plan: Expect less than 2 midnight hospital stay Consults called: Nephrology Admission status: Tele observation  Lequita Halt MD [facility] Pager [phone] 12/02/2022, 3:01 PM

## 2022-12-02 NOTE — ED Notes (Addendum)
ED TO INPATIENT HANDOFF REPORT  ED Nurse Name and Phone #: Vincente Liberty [phone]  S Name/Age/Gender Jose [name] 47 y.o. male Room/Bed: 038C/038C  Code Status   Code Status: Full Code  Home/SNF/Other Home Patient oriented to: self, place, time, and situation Is this baseline? Yes   Triage Complete: Triage complete  Chief Complaint Nausea & vomiting [R11.2]  Triage Note Pt reports N/V/D , chills, weakness, decreased PO intake, dizziness since Monday/Tuesday. Today he noticed dark red blood in emesis and says last time this happened (in December) he needed a blood transfusion. Had dialysis on Tuesday and due for dialysis today.    Allergies Allergies  Allergen Reactions   Cranberry Itching   Hm Lidocaine Patch [Lidocaine] Dermatitis    Blisters skin    Melatonin Other (See Comments)    nightmares   Nsaids Other (See Comments)    Stage 3 kidney disease   Trulicity [Dulaglutide] Other (See Comments)    Night sweats  Uncontrolled tempeture     Level of Care/Admitting Diagnosis ED Disposition     ED Disposition  Admit   Condition  --   Comment  Hospital Area: [place] [[id number]]  Level of Care: Telemetry Medical [104]  May place patient in observation at [facility] or [place] if equivalent level of care is available:: No  Covid Evaluation: Asymptomatic - no recent exposure (last 10 days) testing not required  Diagnosis: Nausea & vomiting [[id number]]  Admitting Physician: Lequita Halt [[id number]]  Attending Physician: Lequita Halt [[id number]]          B Medical/Surgery History Past Medical History:  Diagnosis Date   Anemia    ESRD on hemodialysis ([place])    started in Oct 2023, [facility]   Family history of adverse reaction to anesthesia    daughter age 37 months , coded after recieving 1 injection  for surgery - an inhalation med and morphine   GAD (generalized anxiety disorder)    Gastroparesis    Headache    Hyperlipidemia     Hypertension    Macular degeneration, bilateral    Pneumonia    Retinopathy    Type II diabetes mellitus with complication, uncontrolled    retinopathy, neuropathy, microalbuminuria   Past Surgical History:  Procedure Laterality Date   APPENDECTOMY     AV FISTULA PLACEMENT Left 10/13/2022   Procedure: LEFT ARM ARTERIOVENOUS (AV) GRAFT CREATION;  Surgeon: [name], MD;  Location: [place];  Service: Vascular;  Laterality: Left;   BIOPSY  08/10/2021   Procedure: BIOPSY;  Surgeon: Otis Brace, MD;  Location: [place];  Service: Gastroenterology;;   BUBBLE STUDY  07/29/2021   Procedure: BUBBLE STUDY;  Surgeon: Jerline Pain, MD;  Location: [place];  Service: Cardiovascular;;   CAPD INSERTION N/A 08/25/2022   Procedure: LAPAROSCOPIC INSERTION CONTINUOUS AMBULATORY PERITONEAL DIALYSIS  (CAPD) CATHETER WITH OMENTOPEXY;  Surgeon: [name], MD;  Location: [place];  Service: Vascular;  Laterality: N/A;   CAPD REMOVAL N/A 10/13/2022   Procedure: LAPAROSCOPIC REMOVAL OF CONTINUOUS AMBULATORY PERITONEAL DIALYSIS  (CAPD) CATHETER;  Surgeon: [name], MD;  [facility];  Service: Vascular;  Laterality: N/A;   ESOPHAGOGASTRODUODENOSCOPY (EGD) WITH PROPOFOL N/A 08/10/2021   Procedure: ESOPHAGOGASTRODUODENOSCOPY (EGD) WITH PROPOFOL;  Surgeon: Otis Brace, MD;  [facility];  Service: Gastroenterology;  Laterality: N/A;   ESOPHAGOGASTRODUODENOSCOPY (EGD) WITH PROPOFOL N/A 08/24/2021   Procedure: ESOPHAGOGASTRODUODENOSCOPY (EGD) WITH PROPOFOL;  Surgeon: Otis Brace, MD;  [facility];  Service: Gastroenterology;  Laterality: N/A;   HERNIA REPAIR     IR FLUORO GUIDED NEEDLE PLC ASPIRATION/INJECTION LOC  07/28/2021   LUMBAR LAMINECTOMY/DECOMPRESSION MICRODISCECTOMY N/A 08/07/2021   Procedure: THORACIC FOUR - THORACIC FIVE LAMINECTOMY/DECOMPRESSION OF SPINAL CORD, DEBRIDEMENT OF ABSCESS, MICRODISCECTOMY, INTRAOPERATIVE ULTRASOUND;  Surgeon:  Consuella Lose, MD;  Location: [place];  Service: Neurosurgery;  Laterality: N/A;   TEE WITHOUT CARDIOVERSION N/A 07/29/2021   Procedure: TRANSESOPHAGEAL ECHOCARDIOGRAM (TEE);  Surgeon: Jerline Pain, MD;  Location: [facility] ENDOSCOPY;  Service: Cardiovascular;  Laterality: N/A;   TRIGGER FINGER RELEASE Right 10/25/2019   Procedure: RIGHT INDEX FINGER RELEASE TRIGGER FINGER/A-1 PULLEY;  Surgeon: Daryll Brod, MD;  Location: [place];  Service: Orthopedics;  Laterality: Right;  IV REGIONAL FOREARM BLOCK     A IV Location/Drains/Wounds Patient Lines/Drains/Airways Status     Active Line/Drains/Airways     Name Placement date Placement time Site Days   Peripheral IV 12/02/22 20 G Right Antecubital 12/02/22  0910  Antecubital  less than 1   Hemodialysis Catheter Right Subclavian --  --  Subclavian  --            Intake/Output Last 24 hours No intake or output data in the 24 hours ending 12/02/22 1505  Labs/Imaging Results for orders placed or performed during the hospital encounter of 12/02/22 (from the past 48 hour(s))  CBC with Differential/Platelet     Status: Abnormal   Collection Time: 12/02/22  9:10 AM  Result Value Ref Range   WBC 19.0 (H) 4.0 - 10.5 K/uL   RBC 4.24 4.22 - 5.81 MIL/uL   Hemoglobin 13.2 13.0 - 17.0 g/dL   HCT 36.5 (L) 39.0 - 52.0 %   MCV 86.1 80.0 - 100.0 fL   MCH 31.1 26.0 - 34.0 pg   MCHC 36.2 (H) 30.0 - 36.0 g/dL   RDW 12.0 11.5 - 15.5 %   Platelets 228 150 - 400 K/uL   nRBC 0.0 0.0 - 0.2 %   Neutrophils Relative % 83 %   Neutro Abs 15.8 (H) 1.7 - 7.7 K/uL   Lymphocytes Relative 8 %   Lymphs Abs 1.4 0.7 - 4.0 K/uL   Monocytes Relative 9 %   Monocytes Absolute 1.7 (H) 0.1 - 1.0 K/uL   Eosinophils Relative 0 %   Eosinophils Absolute 0.0 0.0 - 0.5 K/uL   Basophils Relative 0 %   Basophils Absolute 0.0 0.0 - 0.1 K/uL   Immature Granulocytes 0 %   Abs Immature Granulocytes 0.08 (H) 0.00 - 0.07 K/uL    Comment: Performed at [facility], [address], [place], [place] [address]  Comprehensive metabolic panel     Status: Abnormal   Collection Time: 12/02/22  9:10 AM  Result Value Ref Range   Sodium 133 (L) 135 - 145 mmol/L   Potassium 3.6 3.5 - 5.1 mmol/L   Chloride 89 (L) 98 - 111 mmol/L   CO2 28 22 - 32 mmol/L   Glucose, Bld 306 (H) 70 - 99 mg/dL    Comment: Glucose reference range applies only to samples taken after fasting for at least 8 hours.   BUN 62 (H) 6 - 20 mg/dL   Creatinine, Ser 4.44 (H) 0.61 - 1.24 mg/dL   Calcium 8.9 8.9 - 10.3 mg/dL   Total Protein 7.1 6.5 - 8.1 g/dL   Albumin 3.8 3.5 - 5.0 g/dL   AST 23 15 - 41 U/L   ALT 19  0 - 44 U/L   Alkaline Phosphatase 108 38 - 126 U/L   Total Bilirubin 1.2 0.3 - 1.2 mg/dL   GFR, Estimated 16 (L) >60 mL/min    Comment: (NOTE) Calculated using the CKD-EPI Creatinine Equation (2021)    Anion gap 16 (H) 5 - 15    Comment: Performed at [facility] [address] [place], [place] [address]  Lipase, blood     Status: Abnormal   Collection Time: 12/02/22  9:10 AM  Result Value Ref Range   Lipase 60 (H) 11 - 51 U/L    Comment: Performed at [facility] [address], [place], [place] [address]  Type and screen [place]     Status: None   Collection Time: 12/02/22  9:25 AM  Result Value Ref Range   ABO/RH(D) A POS    Antibody Screen NEG    Sample Expiration      12/05/2022,2359 Performed at [facility], [place] [address], [place], [place] [address]   Lactic acid, plasma     Status: None   Collection Time: 12/02/22  9:45 AM  Result Value Ref Range   Lactic Acid, Venous 1.3 0.5 - 1.9 mmol/L    Comment: Performed at [facility] [address] [place], [place] [address]  Lactic acid, plasma     Status: None   Collection Time: 12/02/22 11:34 AM  Result Value Ref Range   Lactic Acid, Venous 1.1 0.5 - 1.9 mmol/L    Comment: Performed at [facility] [address], [place], [place] [address]   Beta-hydroxybutyric acid     Status: Abnormal   Collection Time: 12/02/22 12:44 PM  Result Value Ref Range   Beta-Hydroxybutyric Acid 1.13 (H) 0.05 - 0.27 mmol/L    Comment: Performed at [facility] [address], [place], [place] [address]  I-Stat venous blood gas, [facility], [facility], [facility])     Status: Abnormal   Collection Time: 12/02/22 12:48 PM  Result Value Ref Range   pH, Ven 7.412 7.25 - 7.43   pCO2, Ven 43.9 (L) 44 - 60 mmHg   pO2, Ven 53 (H) 32 - 45 mmHg   Bicarbonate 28.0 20.0 - 28.0 mmol/L   TCO2 29 22 - 32 mmol/L   O2 Saturation 87 %   Acid-Base Excess 3.0 (H) 0.0 - 2.0 mmol/L   Sodium 133 (L) 135 - 145 mmol/L   Potassium 3.6 3.5 - 5.1 mmol/L   Calcium, Ion 1.09 (L) 1.15 - 1.40 mmol/L   HCT 32.0 (L) 39.0 - 52.0 %   Hemoglobin 10.9 (L) 13.0 - 17.0 g/dL   Sample type VENOUS   CBG monitoring, ED     Status: Abnormal   Collection Time: 12/02/22  2:42 PM  Result Value Ref Range   Glucose-Capillary 269 (H) 70 - 99 mg/dL    Comment: Glucose reference range applies only to samples taken after fasting for at least 8 hours.   DG Chest Port 1 View  Result Date: 12/02/2022 CLINICAL DATA:  Dialysis patient with nausea, vomiting and diarrhea. EXAM: PORTABLE CHEST 1 VIEW COMPARISON:  Radiographs 09/15/2022 and 08/19/2022.  CT 08/19/2022. FINDINGS: 1147 hours. Slightly lower lung volumes. Right IJ hemodialysis catheter is unchanged at the level of the mid right atrium. The heart size and mediastinal contours are stable. The lungs are clear. There is no pleural effusion or pneumothorax. No acute osseous findings are demonstrated. There are  grossly stable chronic deformities in the upper thoracic spine. Telemetry leads overlie the chest. IMPRESSION: No evidence of acute cardiopulmonary process. Right IJ hemodialysis catheter unchanged in position. Electronically Signed   By: Richardean Sale M.D.   On: 12/02/2022 12:47   CT ABDOMEN PELVIS WO CONTRAST  Result Date: 12/02/2022 CLINICAL DATA:   Acute abdominal pain. EXAM: CT ABDOMEN AND PELVIS WITHOUT CONTRAST TECHNIQUE: Multidetector CT imaging of the abdomen and pelvis was performed following the standard protocol without IV contrast. RADIATION DOSE REDUCTION: This exam was performed according to the departmental dose-optimization program which includes automated exposure control, adjustment of the mA and/or kV according to patient size and/or use of iterative reconstruction technique. COMPARISON:  09/13/2022 FINDINGS: Lower chest: Lung bases are clear. Patient is dialysis catheter tip over the right atrium unchanged. Hepatobiliary: Minimal cholelithiasis. Liver and biliary tree are normal. Pancreas: Normal. Spleen: Normal. Adrenals/Urinary Tract: Adrenal glands are normal. Kidneys are normal in size without hydronephrosis or nephrolithiasis. Ureters and bladder normal. Stomach/Bowel: Stomach and small bowel are normal. Appendix not visualized. Colon is normal. Vascular/Lymphatic: Noncalcified plaque of the abdominal aorta. Aorta is normal in caliber. No adenopathy. Reproductive: Normal. Other: Interval removal of peritoneal dialysis catheter. No free fluid or focal inflammatory change. Musculoskeletal: No focal abnormality. IMPRESSION: 1. No acute findings in the abdomen/pelvis. Interval removal peritoneal dialysis catheter. 2. Minimal cholelithiasis. 3. Aortic atherosclerosis. Aortic Atherosclerosis (ICD10-I70.0). Electronically Signed   By: Marin Olp M.D.   On: 12/02/2022 11:28    Pending Labs Unresulted Labs (From admission, onward)     Start     Ordered   12/03/22 0500  CBC  Tomorrow morning,   R        12/02/22 1406   12/03/22 2353  Basic metabolic panel  Tomorrow morning,   R        12/02/22 1406   12/02/22 2200  Hemoglobin and hematocrit, blood  Once-Timed,   STAT        12/02/22 1406   12/02/22 1600  Hemoglobin and hematocrit, blood  Once-Timed,   STAT        12/02/22 1406   12/02/22 1451  Hepatitis B surface antigen  (New  Admission Hemo Labs (Hepatitis B))  Once,   URGENT        12/02/22 1452   12/02/22 1451  Hepatitis B surface antibody,quantitative  (New Admission Hemo Labs (Hepatitis B))  Once,   URGENT        12/02/22 1452   12/02/22 0926  Culture, blood (Routine X 2) w Reflex to ID Panel  BLOOD CULTURE X 2,   R (with STAT occurrences)      12/02/22 0925            Vitals/Pain Today's Vitals   12/02/22 1230 12/02/22 1341 12/02/22 1400 12/02/22 1438  BP: 128/73 (!) 144/81 (!) 154/78   Pulse: 93 95 93   Resp: 16 (!) 21 17   Temp:    98.3 F (36.8 C)  TempSrc:    Oral  SpO2: 95% 96% 95%   PainSc:        Isolation Precautions No active isolations  Medications Medications  0.9 %  sodium chloride infusion ( Intravenous Restarted 12/02/22 1146)  insulin aspart (novoLOG) injection 10 Units (10 Units Intravenous Not Given 12/02/22 1447)  Chlorhexidine Gluconate Cloth 2 % PADS 6 each (has no administration in time range)  ondansetron (ZOFRAN) injection 4 mg (4 mg Intravenous Given 12/02/22 0954)  sodium chloride 0.9 % bolus  500 mL (0 mLs Intravenous Stopped 12/02/22 1029)  pantoprazole (PROTONIX) injection 40 mg (40 mg Intravenous Given 12/02/22 0954)  chlorproMAZINE (THORAZINE) 25 mg in sodium chloride 0.9 % 25 mL IVPB (0 mg Intravenous Stopped 12/02/22 1153)    Mobility walks     Focused Assessments    R Recommendations: See Admitting Provider Note  Report given to:   Additional Notes:   Pt is a dialysis pt, pt came in with N/V/D and an episode of bloody emesis last time this happened he needed to be transfused. Doing serial H&H next lab due at 1600

## 2022-12-02 NOTE — Consult Note (Signed)
Renal Service Consult Note [name]  Jose [name] 12/02/2022 Sol Blazing, MD Requesting Physician: Dr. Rogene Houston  Reason for Consult: ESRD pt w/ chills, fevers, N/V HPI: The patient is a 47 y.o. year-old w/ PMH as below who presented to ED w/ n/v/d, chills, weakness, dizziness for last 2-3 days. Missed outpt HD yesterday. In ED afeb and LA wnl but WBC 19k and pt having rigors. Blood cx's sent and pt will be admitted. We are asked to see for ESRD.    Pt states he started HD in October 2023.  He had PD cath placed in November and it required a revision and was removed in December (not sure why removed just yet). Has had RIJ TDC in since starting HD in October. No drainage at exit site per the pateint. Denies any SOB, cough , orthopnea.    Procedure list 08/25/22- LOA, lap omentopexy and PD cath placement, Dr Stanford Breed 09/08/22 - diagnostic lap, revision of PD catheter 10/13/22 - LUE 1st stage basilic vein AVF, removal of PD cath     ROS - denies CP, no joint pain, no HA, no blurry vision, no rash, no diarrhea, no nausea/ vomiting, no dysuria, no difficulty voiding   Past Medical History  Past Medical History:  Diagnosis Date   AKI (acute kidney injury) ([place])    Anemia    Family history of adverse reaction to anesthesia    daughter age 70 months , coded after recieving 1 injection  for surgery - an inhalation med and morphine   GAD (generalized anxiety disorder)    Gastroparesis    Headache    Hyperlipidemia    Hypertension    Macular degeneration, bilateral    Pneumonia    Retinopathy    Type II diabetes mellitus with complication, uncontrolled    retinopathy, neuropathy, microalbuminuria   Past Surgical History  Past Surgical History:  Procedure Laterality Date   APPENDECTOMY     AV FISTULA PLACEMENT Left 10/13/2022   Procedure: LEFT ARM ARTERIOVENOUS (AV) GRAFT CREATION;  Surgeon: [name], MD;  Location: [place];  Service: Vascular;   Laterality: Left;   BIOPSY  08/10/2021   Procedure: BIOPSY;  Surgeon: Otis Brace, MD;  Location: [place];  Service: Gastroenterology;;   BUBBLE STUDY  07/29/2021   Procedure: BUBBLE STUDY;  Surgeon: Jerline Pain, MD;  Location: [place];  Service: Cardiovascular;;   CAPD INSERTION N/A 08/25/2022   Procedure: LAPAROSCOPIC INSERTION CONTINUOUS AMBULATORY PERITONEAL DIALYSIS  (CAPD) CATHETER WITH OMENTOPEXY;  Surgeon: [name], MD;  Location: [place];  Service: Vascular;  Laterality: N/A;   CAPD REMOVAL N/A 10/13/2022   Procedure: LAPAROSCOPIC REMOVAL OF CONTINUOUS AMBULATORY PERITONEAL DIALYSIS  (CAPD) CATHETER;  Surgeon: [name], MD;  [facility];  Service: Vascular;  Laterality: N/A;   ESOPHAGOGASTRODUODENOSCOPY (EGD) WITH PROPOFOL N/A 08/10/2021   Procedure: ESOPHAGOGASTRODUODENOSCOPY (EGD) WITH PROPOFOL;  Surgeon: Otis Brace, MD;  [facility];  Service: Gastroenterology;  Laterality: N/A;   ESOPHAGOGASTRODUODENOSCOPY (EGD) WITH PROPOFOL N/A 08/24/2021   Procedure: ESOPHAGOGASTRODUODENOSCOPY (EGD) WITH PROPOFOL;  Surgeon: Otis Brace, MD;  Location: [place];  Service: Gastroenterology;  Laterality: N/A;   HERNIA REPAIR     IR FLUORO GUIDED NEEDLE PLC ASPIRATION/INJECTION LOC  07/28/2021   LUMBAR LAMINECTOMY/DECOMPRESSION MICRODISCECTOMY N/A 08/07/2021   Procedure: THORACIC FOUR - THORACIC FIVE LAMINECTOMY/DECOMPRESSION OF SPINAL CORD, DEBRIDEMENT OF ABSCESS, MICRODISCECTOMY, INTRAOPERATIVE ULTRASOUND;  Surgeon: Consuella Lose, MD;  Location: [place];  Service: Neurosurgery;  Laterality: N/A;  TEE WITHOUT CARDIOVERSION N/A 07/29/2021   Procedure: TRANSESOPHAGEAL ECHOCARDIOGRAM (TEE);  Surgeon: Jerline Pain, MD;  Location: [facility] ENDOSCOPY;  Service: Cardiovascular;  Laterality: N/A;   TRIGGER FINGER RELEASE Right 10/25/2019   Procedure: RIGHT INDEX FINGER RELEASE TRIGGER FINGER/A-1 PULLEY;  Surgeon: Daryll Brod, MD;  Location:  [place];  Service: Orthopedics;  Laterality: Right;  IV REGIONAL FOREARM BLOCK   Family History  Family History  Problem Relation Age of Onset   Diabetes Mother    Hyperlipidemia Mother    Stroke Mother    Diabetes Father    Hyperlipidemia Brother    Stroke Brother    ADD / ADHD Brother    ADD / ADHD Son    Social History  reports that he quit smoking about 2 years ago. His smoking use included cigarettes. He has a 20.00 pack-year smoking history. He has never used smokeless tobacco. He reports that he does not currently use alcohol. He reports that he does not use drugs. Allergies  Allergies  Allergen Reactions   Cranberry Itching   Hm Lidocaine Patch [Lidocaine] Dermatitis    Blisters skin    Melatonin Other (See Comments)    nightmares   Nsaids Other (See Comments)    Stage 3 kidney disease   Trulicity [Dulaglutide] Other (See Comments)    Night sweats  Uncontrolled tempeture    Home medications Prior to Admission medications   Medication Sig Start Date End Date Taking? Authorizing Provider  acetaminophen (TYLENOL) 500 MG tablet Take 500-1,000 mg by mouth every 6 (six) hours as needed for mild pain.    [provider]  albuterol (VENTOLIN HFA) 108 (90 Base) MCG/ACT inhaler Inhale 1 puff into the lungs every 4 (four) hours as needed for wheezing or shortness of breath. 05/27/21   [provider]  ALPRAZolam Duanne Moron) 0.25 MG tablet Take 0.25 mg by mouth 2 (two) times daily as needed for anxiety. 07/22/22   [provider]  atorvastatin (LIPITOR) 40 MG tablet Take 40 mg by mouth daily.    [provider]  cholecalciferol (VITAMIN D3) 25 MCG (1000 UNIT) tablet Take 1,000 Units by mouth daily.    [provider]  Continuous Blood Gluc Sensor (FREESTYLE LIBRE 3 SENSOR) MISC 1 Device by Does not apply route every 14 (fourteen) days. Apply 1 sensor on upper arm every 14 days for continuous glucose monitoring 11/01/22   Elayne Snare, MD  diclofenac Sodium (VOLTAREN) 1 % GEL Apply 1 Application topically 2 (two) times daily as needed (shoulder pain).    [provider]  gabapentin (NEURONTIN) 100 MG capsule Take 2 capsules (200 mg total) by mouth 2 (two) times daily. 09/21/21   Love, Ivan Anchors, PA-C  hydrALAZINE (APRESOLINE) 50 MG tablet Take 1 tablet (50 mg total) by mouth every 8 (eight) hours. Patient taking differently: Take 50 mg by mouth See admin instructions. On dialysis days Mon, Tues, Thurs and Fri 50 mg once daily, all other days will take 50 mg 3 times daily 06/08/22   Elgergawy, Silver Huguenin, MD  hydrOXYzine (ATARAX) 25 MG tablet TAKE 1 TABLET(25 MG) BY MOUTH THREE TIMES DAILY AS NEEDED FOR ANXIETY OR NAUSEA 11/25/22   Lovorn, Jinny Blossom, MD  insulin glargine, 1 Unit Dial, (TOUJEO SOLOSTAR) 300 UNIT/ML Solostar Pen Inject 20 Units into the skin daily. Adjust as directed Patient taking differently: Inject 12-14 Units into the skin See admin instructions. 12 units in the morning, 14 units at bedtime 10/04/22   Dwyane Dee,  Vicenta Aly, MD  insulin lispro (HUMALOG KWIKPEN) 100 UNIT/ML KwikPen Inject 10 Units into the skin 3 (three) times daily. Patient taking differently: Inject 2 Units into the skin 3 (three) times daily before meals. 10/04/22   Elayne Snare, MD  Insulin Pen Needle (BD PEN NEEDLE NANO 2ND GEN) 32G X 4 MM MISC USE AS DIRECTED FOUR TIMES A DAY. 11/01/22   Elayne Snare, MD  iron polysaccharides (NIFEREX) 150 MG capsule Take 1 capsule (150 mg total) by mouth daily. Patient taking differently: Take 150 mg by mouth 4 (four) times a week. On Dialysis Days Mon, Tues, Thurs, Fri 07/06/22   Raiford Noble Latif, DO  metoCLOPramide (REGLAN) 5 MG tablet Take 5 mg by mouth in the morning and at bedtime.    [provider]  Metoprolol Tartrate 75 MG TABS Take 75 mg by mouth 2 (two) times daily. 06/08/22   Elgergawy, Silver Huguenin, MD  mirtazapine (REMERON) 15 MG tablet Take 1 tablet (15 mg total) by mouth at bedtime. 09/21/21    Love, Ivan Anchors, PA-C  oxyCODONE-acetaminophen (PERCOCET) 5-325 MG tablet Take 1 tablet by mouth every 4 (four) hours as needed for severe pain. 10/13/22 10/13/23  Schuh, McKenzi P, PA-C  pantoprazole (PROTONIX) 40 MG tablet Take 1 tablet (40 mg total) by mouth daily. 07/06/22   Raiford Noble Latif, DO  PARoxetine (PAXIL) 20 MG tablet Take 1 tablet (20 mg total) by mouth at bedtime. 09/21/21   Love, Ivan Anchors, PA-C  polyethylene glycol (MIRALAX / GLYCOLAX) 17 g packet Take 17 g by mouth daily as needed for moderate constipation, mild constipation or severe constipation. 08/29/21   Terrilee Croak, MD  prochlorperazine (COMPAZINE) 5 MG tablet Take 1 tablet (5 mg total) by mouth every 8 (eight) hours as needed for nausea or vomiting. If having nausea despite Reglan 09/17/22   Lowry Ram, MD  sevelamer carbonate (RENVELA) 800 MG tablet Take 800 mg by mouth 3 (three) times daily with meals. 08/12/22   [provider]  tamsulosin (FLOMAX) 0.4 MG CAPS capsule Take 1 capsule (0.4 mg total) by mouth daily after supper. 09/21/21   Love, Ivan Anchors, PA-C  torsemide 40 MG TABS Take 40 mg by mouth daily. 07/06/22   Raiford Noble Latif, DO  traZODone (DESYREL) 100 MG tablet Take 100 mg by mouth at bedtime.    [provider]     Vitals:   12/02/22 1130 12/02/22 1200 12/02/22 1230 12/02/22 1341  BP: (!) 149/85 137/79 128/73 (!) 144/81  Pulse: 99 99 93 95  Resp: 15 16 16  (!) 21  Temp:      TempSrc:      SpO2: 95% 94% 95% 96%   Exam Gen alert, no distress, hiccups and nasal congestion No rash, cyanosis or gangrene Sclera anicteric, throat clear  No jvd or bruits Chest clear bilat to bases, no rales/ wheezing RRR no MRG Abd soft ntnd no mass or ascites +bs GU normal male MS no joint effusions or deformity Ext no LE or UE edema, no wounds or ulcers Neuro is alert, Ox 3 , nf    RIJ TDC clean exit site, nontender      Home meds include - albuterol, lipitor, neurontin, hydralazine 50  tid, insulin glargine/ lispro, reglan 5mg  bid, metoprolol 75 bid, remeron, percocet prn, protonix, paxil, prns/ vits/ supps     OP HD: [facility] 3h  400/800   90kg  2/2 bath  RIJ TDC   Hep 6000 - [facility] HD at [facility]  2/06, post wt 89.2kg - rocaltrol 0.50 mcg po every hd - last Hb 14 on 2/6   Assessment/ Plan: Chills/ N/V/ rigors/ [facility] - exam negative and CXR negative. Could have HD catheter related sepsis. Agree w/ blood cx's and IV abx while cx's pending.  ESRD - on HD 4x per week. Missed HD today. Labs and vol are stable so will just resume schedule w/ next HD tomorrow.  HTN/ volume - get wts when in a room. BP's wnl, euvolemic on exam.  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 Cont po vdra w/ every hd.  DM2 - w/ complications      Rob Kalum Minner  MD 12/02/2022, 2:32 PM  Recent Labs  Lab 12/02/22 0910 12/02/22 1248  HGB 13.2 10.9*  ALBUMIN 3.8  --   CALCIUM 8.9  --   CREATININE 4.44*  --   K 3.6 3.6   Inpatient medications:   sodium chloride 75 mL/hr at 12/02/22 1146

## 2022-12-02 NOTE — ED Notes (Signed)
Transport bringing pt up to room

## 2022-12-02 NOTE — ED Provider Notes (Addendum)
[facility] Provider Note   CSN: [id number] Arrival date & time: 12/02/22  4132     History  Chief Complaint  Patient presents with   Emesis    Jose [name] is a 47 y.o. male.  Patient is currently a hemodialysis patient.  Used to be peritoneal dialysis.  Also followed by [facility] kidney transplant team.  Patient is being evaluated for possible kidney transplant.  Patient currently receiving dialysis through his left arm AV fistula Monday Tuesday Thursdays and Fridays.  Patient last dialyzed Tuesday.  It is interesting the patient has more than 3 times a week.  Patient would have normally gone to dialysis today.  Patient is followed by [facility].  Patient with onset of nausea vomiting and diarrhea chills and weakness and decreased p.o. intake starting on Tuesday.  He said he vomited 1 time dark red blood.  Last time this happened in December he needed blood transfusion.  Denies any blood per rectum.  Patient has a history of type 2 diabetes hyperlipidemia hypertension past surgical history significant for appendectomy AV fistula placement December 23.  Patient had temporary dialysis catheter removed from right anterior chest in December.  Patient with frequent vomiting.  Chills no documented fever no upper respiratory symptoms.  No one sick at home.       Home Medications Prior to Admission medications   Medication Sig Start Date End Date Taking? Authorizing Provider  acetaminophen (TYLENOL) 500 MG tablet Take 500-1,000 mg by mouth every 6 (six) hours as needed for mild pain.    [provider]  albuterol (VENTOLIN HFA) 108 (90 Base) MCG/ACT inhaler Inhale 1 puff into the lungs every 4 (four) hours as needed for wheezing or shortness of breath. 05/27/21   [provider]  ALPRAZolam Duanne Moron) 0.25 MG tablet Take 0.25 mg by mouth 2 (two) times daily as needed for anxiety. 07/22/22   [provider]   atorvastatin (LIPITOR) 40 MG tablet Take 40 mg by mouth daily.    [provider]  cholecalciferol (VITAMIN D3) 25 MCG (1000 UNIT) tablet Take 1,000 Units by mouth daily.    [provider]  Continuous Blood Gluc Sensor (FREESTYLE LIBRE 3 SENSOR) MISC 1 Device by Does not apply route every 14 (fourteen) days. Apply 1 sensor on upper arm every 14 days for continuous glucose monitoring 11/01/22   Elayne Snare, MD  diclofenac Sodium (VOLTAREN) 1 % GEL Apply 1 Application topically 2 (two) times daily as needed (shoulder pain).    [provider]  gabapentin (NEURONTIN) 100 MG capsule Take 2 capsules (200 mg total) by mouth 2 (two) times daily. 09/21/21   Love, Ivan Anchors, PA-C  hydrALAZINE (APRESOLINE) 50 MG tablet Take 1 tablet (50 mg total) by mouth every 8 (eight) hours. Patient taking differently: Take 50 mg by mouth See admin instructions. On dialysis days Mon, Tues, Thurs and Fri 50 mg once daily, all other days will take 50 mg 3 times daily 06/08/22   Elgergawy, Silver Huguenin, MD  hydrOXYzine (ATARAX) 25 MG tablet TAKE 1 TABLET(25 MG) BY MOUTH THREE TIMES DAILY AS NEEDED FOR ANXIETY OR NAUSEA 11/25/22   Lovorn, Jinny Blossom, MD  insulin glargine, 1 Unit Dial, (TOUJEO SOLOSTAR) 300 UNIT/ML Solostar Pen Inject 20 Units into the skin daily. Adjust as directed Patient taking differently: Inject 12-14 Units into the skin See admin instructions. 12 units in the morning, 14 units at bedtime 10/04/22   Elayne Snare, MD  insulin lispro (HUMALOG KWIKPEN) 100 UNIT/ML KwikPen Inject 10 Units into the skin 3 (three) times daily. Patient taking differently: Inject 2 Units into the skin 3 (three) times daily before meals. 10/04/22   Elayne Snare, MD  Insulin Pen Needle (BD PEN NEEDLE NANO 2ND GEN) 32G X 4 MM MISC USE AS DIRECTED FOUR TIMES A DAY. 11/01/22   Elayne Snare, MD  iron polysaccharides (NIFEREX) 150 MG capsule Take 1 capsule (150 mg total) by mouth daily. Patient taking differently: Take 150 mg by  mouth 4 (four) times a week. On Dialysis Days Mon, Tues, Thurs, Fri 07/06/22   Raiford Noble Latif, DO  metoCLOPramide (REGLAN) 5 MG tablet Take 5 mg by mouth in the morning and at bedtime.    [provider]  Metoprolol Tartrate 75 MG TABS Take 75 mg by mouth 2 (two) times daily. 06/08/22   Elgergawy, Silver Huguenin, MD  mirtazapine (REMERON) 15 MG tablet Take 1 tablet (15 mg total) by mouth at bedtime. 09/21/21   Love, Ivan Anchors, PA-C  oxyCODONE-acetaminophen (PERCOCET) 5-325 MG tablet Take 1 tablet by mouth every 4 (four) hours as needed for severe pain. 10/13/22 10/13/23  Schuh, McKenzi P, PA-C  pantoprazole (PROTONIX) 40 MG tablet Take 1 tablet (40 mg total) by mouth daily. 07/06/22   Raiford Noble Latif, DO  PARoxetine (PAXIL) 20 MG tablet Take 1 tablet (20 mg total) by mouth at bedtime. 09/21/21   Love, Ivan Anchors, PA-C  polyethylene glycol (MIRALAX / GLYCOLAX) 17 g packet Take 17 g by mouth daily as needed for moderate constipation, mild constipation or severe constipation. 08/29/21   Terrilee Croak, MD  prochlorperazine (COMPAZINE) 5 MG tablet Take 1 tablet (5 mg total) by mouth every 8 (eight) hours as needed for nausea or vomiting. If having nausea despite Reglan 09/17/22   Lowry Ram, MD  sevelamer carbonate (RENVELA) 800 MG tablet Take 800 mg by mouth 3 (three) times daily with meals. 08/12/22   [provider]  tamsulosin (FLOMAX) 0.4 MG CAPS capsule Take 1 capsule (0.4 mg total) by mouth daily after supper. 09/21/21   Love, Ivan Anchors, PA-C  torsemide 40 MG TABS Take 40 mg by mouth daily. 07/06/22   Raiford Noble Latif, DO  traZODone (DESYREL) 100 MG tablet Take 100 mg by mouth at bedtime.    [provider]      Allergies    Cranberry, Hm lidocaine patch [lidocaine], Melatonin, Nsaids, and Trulicity [dulaglutide]    Review of Systems   Review of Systems  Constitutional:  Positive for chills. Negative for fever.  HENT:  Negative for ear pain and sore throat.   Eyes:   Negative for pain and visual disturbance.  Respiratory:  Negative for cough and shortness of breath.   Cardiovascular:  Negative for chest pain and palpitations.  Gastrointestinal:  Positive for diarrhea, nausea and vomiting. Negative for abdominal pain.  Genitourinary:  Negative for dysuria and hematuria.  Musculoskeletal:  Negative for arthralgias and back pain.  Skin:  Negative for color change and rash.  Neurological:  Negative for seizures and syncope.  All other systems reviewed and are negative.   Physical Exam Updated Vital Signs BP (!) 171/89 (BP Location: Right Arm)   Pulse 85   Temp 98.7 F (37.1 C) (Oral)   Resp (!) 27   SpO2 97%  Physical Exam Vitals and nursing note reviewed.  Constitutional:      General: He is not in acute distress.    Appearance: He is  well-developed.  HENT:     Head: Normocephalic and atraumatic.     Mouth/Throat:     Mouth: Mucous membranes are dry.  Eyes:     Extraocular Movements: Extraocular movements intact.     Conjunctiva/sclera: Conjunctivae normal.     Pupils: Pupils are equal, round, and reactive to light.  Cardiovascular:     Rate and Rhythm: Normal rate and regular rhythm.     Heart sounds: No murmur heard. Pulmonary:     Effort: Pulmonary effort is normal. No respiratory distress.     Breath sounds: Normal breath sounds.     Comments: Well-healed scars from right anterior upper chest temporary catheter. Abdominal:     Palpations: Abdomen is soft.     Tenderness: There is no abdominal tenderness.  Musculoskeletal:        General: No swelling.     Cervical back: Normal range of motion and neck supple.     Comments: AV fistula with thrill left upper extremity  Skin:    General: Skin is warm and dry.     Capillary Refill: Capillary refill takes less than 2 seconds.  Neurological:     General: No focal deficit present.     Mental Status: He is alert and oriented to person, place, and time.  Psychiatric:        Mood and  Affect: Mood normal.     ED Results / Procedures / Treatments   Labs (all labs ordered are listed, but only abnormal results are displayed) Labs Reviewed  CULTURE, BLOOD (ROUTINE X 2)  CULTURE, BLOOD (ROUTINE X 2)  CBC WITH DIFFERENTIAL/PLATELET  COMPREHENSIVE METABOLIC PANEL  LIPASE, BLOOD  LACTIC ACID, PLASMA  LACTIC ACID, PLASMA  TYPE AND SCREEN    EKG None  Radiology No results found.  Procedures Procedures    Medications Ordered in ED Medications  ondansetron (ZOFRAN) injection 4 mg (has no administration in time range)  0.9 %  sodium chloride infusion (has no administration in time range)  sodium chloride 0.9 % bolus 500 mL (has no administration in time range)  pantoprazole (PROTONIX) injection 40 mg (has no administration in time range)    ED Course/ Medical Decision Making/ A&P                             Medical Decision Making Amount and/or Complexity of Data Reviewed Labs: ordered. Radiology: ordered.  Risk Prescription drug management. Decision regarding hospitalization.  Without fever here.  Lactic acid not elevated so did not activate sepsis protocol but did do blood cultures.  Patient's white blood cell count 19,000.  Blood sugar also markedly elevated venous blood gas he is not acidotic.  Beta hydroxybutyrate was 1.13.  CBC is mention white count was 19,000 hemoglobin good despite the history of some of the vomiting at 13.2.  Complete metabolic panel sodium 371 glucose 306 BUN 62 creatinine 4.4 and potassium 3.6 lites normal.  Had an anion gap of 16.  Venous blood gas not acidotic pH 7.41.  Will give 10 units of IV insulin.  Chest x-ray without any acute findings.  CT scan of the abdomen without any acute findings.  No evidence of any significant GI bleed at this point in time.  Given 500 cc normal saline bolus and then 75 cc an hour.  Discussed with nephrology and they are planning on dialysis for him tomorrow.  Discussed with hospitalist he  is followed by [facility] family  medicine.  Did not initiate sepsis protocol because vital signs have been good.  Lactic acid not elevated.  But blood cultures are pending.  In addition patient not necessarily in DKA.  Hospitalist will admit.  CRITICAL CARE Performed by: Fredia Sorrow Total critical care time: 35 minutes Critical care time was exclusive of separately billable procedures and treating other patients. Critical care was necessary to treat or prevent imminent or life-threatening deterioration. Critical care was time spent personally by me on the following activities: development of treatment plan with patient and/or surrogate as well as nursing, discussions with consultants, evaluation of patient's response to treatment, examination of patient, obtaining history from patient or surrogate, ordering and performing treatments and interventions, ordering and review of laboratory studies, ordering and review of radiographic studies, pulse oximetry and re-evaluation of patient's condition.   Final Clinical Impression(s) / ED Diagnoses Final diagnoses:  End stage renal disease on dialysis [facility])  Generalized abdominal pain  Nausea vomiting and diarrhea    Rx / DC Orders ED Discharge Orders     None         Fredia Sorrow, MD 12/02/22 1437    Fredia Sorrow, MD 12/02/22 1439

## 2022-12-03 DIAGNOSIS — R197 Diarrhea, unspecified: Secondary | ICD-10-CM

## 2022-12-03 DIAGNOSIS — R1084 Generalized abdominal pain: Secondary | ICD-10-CM | POA: Diagnosis not present

## 2022-12-03 DIAGNOSIS — R112 Nausea with vomiting, unspecified: Secondary | ICD-10-CM | POA: Diagnosis not present

## 2022-12-03 DIAGNOSIS — Z992 Dependence on renal dialysis: Secondary | ICD-10-CM

## 2022-12-03 DIAGNOSIS — N186 End stage renal disease: Secondary | ICD-10-CM | POA: Diagnosis not present

## 2022-12-03 LAB — CBC
HCT: 28.4 % — ABNORMAL LOW (ref 39.0–52.0)
Hemoglobin: 10 g/dL — ABNORMAL LOW (ref 13.0–17.0)
MCH: 30.6 pg (ref 26.0–34.0)
MCHC: 35.2 g/dL (ref 30.0–36.0)
MCV: 86.9 fL (ref 80.0–100.0)
Platelets: 193 10*3/uL (ref 150–400)
RBC: 3.27 MIL/uL — ABNORMAL LOW (ref 4.22–5.81)
RDW: 11.9 % (ref 11.5–15.5)
WBC: 14.9 10*3/uL — ABNORMAL HIGH (ref 4.0–10.5)
nRBC: 0 % (ref 0.0–0.2)

## 2022-12-03 LAB — GLUCOSE, CAPILLARY
Glucose-Capillary: 108 mg/dL — ABNORMAL HIGH (ref 70–99)
Glucose-Capillary: 81 mg/dL (ref 70–99)
Glucose-Capillary: 94 mg/dL (ref 70–99)
Glucose-Capillary: 147 mg/dL — ABNORMAL HIGH (ref 70–99)

## 2022-12-03 LAB — HEPATITIS B SURFACE ANTIGEN: Hepatitis B Surface Ag: NONREACTIVE

## 2022-12-03 LAB — BASIC METABOLIC PANEL
Anion gap: 12 (ref 5–15)
BUN: 71 mg/dL — ABNORMAL HIGH (ref 6–20)
CO2: 25 mmol/L (ref 22–32)
Calcium: 8.1 mg/dL — ABNORMAL LOW (ref 8.9–10.3)
Chloride: 97 mmol/L — ABNORMAL LOW (ref 98–111)
Creatinine, Ser: 4.52 mg/dL — ABNORMAL HIGH (ref 0.61–1.24)
GFR, Estimated: 15 mL/min — ABNORMAL LOW (ref >60)
Glucose, Bld: 131 mg/dL — ABNORMAL HIGH (ref 70–99)
Potassium: 3 mmol/L — ABNORMAL LOW (ref 3.5–5.1)
Sodium: 134 mmol/L — ABNORMAL LOW (ref 135–145)

## 2022-12-03 LAB — HEPATITIS B SURFACE ANTIBODY, QUANTITATIVE: Hep B S AB Quant (Post): <3.1 m[IU]/mL — ABNORMAL LOW (ref >9.9)

## 2022-12-03 MED ORDER — HEPARIN SODIUM (PORCINE) 1000 UNIT/ML DIALYSIS
2000.0000 [IU] | INTRAMUSCULAR | Status: AC | PRN
  Administered 2022-12-03: 2000 [IU] via INTRAVENOUS_CENTRAL

## 2022-12-03 MED ORDER — POTASSIUM CHLORIDE CRYS ER 20 MEQ PO TBCR
40.0000 meq | EXTENDED_RELEASE_TABLET | ORAL | Status: AC
  Administered 2022-12-03 (×2): 40 meq via ORAL
  Filled 2022-12-03 (×2): qty 2

## 2022-12-03 MED ORDER — ALTEPLASE 2 MG IJ SOLR: 2.0000 mg | Freq: Once | INTRAMUSCULAR | Status: DC | PRN

## 2022-12-03 MED ORDER — HEPARIN SODIUM (PORCINE) 1000 UNIT/ML DIALYSIS
4000.0000 [IU] | Freq: Once | INTRAMUSCULAR | Status: AC
  Administered 2022-12-03: 4000 [IU] via INTRAVENOUS_CENTRAL
  Filled 2022-12-03 (×2): qty 4

## 2022-12-03 MED ORDER — HEPARIN SODIUM (PORCINE) 1000 UNIT/ML DIALYSIS: 4000.0000 [IU] | Freq: Once | INTRAMUSCULAR | Status: DC

## 2022-12-03 MED ORDER — HEPARIN SODIUM (PORCINE) 1000 UNIT/ML DIALYSIS: 2000.0000 [IU] | INTRAMUSCULAR | Status: DC | PRN

## 2022-12-03 MED ORDER — ANTICOAGULANT SODIUM CITRATE 4% (200MG/5ML) IV SOLN: 5.0000 mL | Status: DC | PRN

## 2022-12-03 MED ORDER — HEPARIN SODIUM (PORCINE) 1000 UNIT/ML DIALYSIS
1000.0000 [IU] | INTRAMUSCULAR | Status: DC | PRN
  Administered 2022-12-03: 1000 [IU]

## 2022-12-03 NOTE — Progress Notes (Signed)
[facility] Progress Note   Subjective:   Seen on HD, has some chills which he reports have been present for several days but are improving. Feels better today. Denies SOB, CP, dizziness, abdominal pain and nausea. BP high this AM, says that is typical for him.   Objective Vitals:   12/03/22 0541 12/03/22 0800 12/03/22 0810 12/03/22 0830  BP: (!) 163/88 (!) 164/83 (!) 157/84 (!) 172/93  Pulse: 74 77 74 73  Resp: 18 (!) 24 19 18  $ Temp: 98.2 F (36.8 C) 98 F (36.7 C)    TempSrc: Oral Oral    SpO2: 95% 96% 95% 94%  Weight:  87.1 kg    Height:       Physical Exam General: Alert male, + chills Heart: RRR, no murmurs, rubs or gallops Lungs: CTA bilaterally, respirations unlabored Abdomen: Soft, non-distended, +BS Extremities: No edema b/l lower extremities Dialysis Access: [facility] accessed  Additional Objective Labs: Basic Metabolic Panel: Recent Labs  Lab 12/02/22 0910 12/02/22 1248 12/03/22 0113  NA 133* 133* 134*  K 3.6 3.6 3.0*  CL 89*  --  97*  CO2 28  --  25  GLUCOSE 306*  --  131*  BUN 62*  --  71*  CREATININE 4.44*  --  4.52*  CALCIUM 8.9  --  8.1*   Liver Function Tests: Recent Labs  Lab 12/02/22 0910  AST 23  ALT 19  ALKPHOS 108  BILITOT 1.2  PROT 7.1  ALBUMIN 3.8   Recent Labs  Lab 12/02/22 0910  LIPASE 60*   CBC: Recent Labs  Lab 12/02/22 0910 12/02/22 1248 12/02/22 1553 12/02/22 2145 12/03/22 0113  WBC 19.0*  --   --   --  14.9*  NEUTROABS 15.8*  --   --   --   --   HGB 13.2   < > 11.4* 11.1* 10.0*  HCT 36.5*   < > 31.5* 30.4* 28.4*  MCV 86.1  --   --   --  86.9  PLT 228  --   --   --  193   < > = values in this interval not displayed.   Blood Culture    Component Value Date/Time   SDES BLOOD LEFT HAND 09/15/2022 1703   SPECREQUEST  09/15/2022 1703    BOTTLES DRAWN AEROBIC ONLY Blood Culture adequate volume   CULT  09/15/2022 1703    NO GROWTH 5 DAYS Performed at [facility], [place] [address]  [place], [place] [address]    REPTSTATUS 09/20/2022 FINAL 09/15/2022 1703    Cardiac Enzymes: No results for input(s): "CKTOTAL", "CKMB", "CKMBINDEX", "TROPONINI" in the last 168 hours. CBG: Recent Labs  Lab 12/02/22 1442 12/02/22 1704 12/02/22 2017 12/03/22 0713  GLUCAP 269* 213* 165* 108*   Iron Studies: No results for input(s): "IRON", "TIBC", "TRANSFERRIN", "FERRITIN" in the last 72 hours. @lablastinr3$ @ Studies/Results: DG Chest Port 1 View  Result Date: 12/02/2022 CLINICAL DATA:  Dialysis patient with nausea, vomiting and diarrhea. EXAM: PORTABLE CHEST 1 VIEW COMPARISON:  Radiographs 09/15/2022 and 08/19/2022.  CT 08/19/2022. FINDINGS: 1147 hours. Slightly lower lung volumes. Right IJ hemodialysis catheter is unchanged at the level of the mid right atrium. The heart size and mediastinal contours are stable. The lungs are clear. There is no pleural effusion or pneumothorax. No acute osseous findings are demonstrated. There are grossly stable chronic deformities in the upper thoracic spine. Telemetry leads overlie the chest. IMPRESSION: No evidence of acute cardiopulmonary process. Right IJ hemodialysis  catheter unchanged in position. Electronically Signed   By: Richardean Sale M.D.   On: 12/02/2022 12:47   CT ABDOMEN PELVIS WO CONTRAST  Result Date: 12/02/2022 CLINICAL DATA:  Acute abdominal pain. EXAM: CT ABDOMEN AND PELVIS WITHOUT CONTRAST TECHNIQUE: Multidetector CT imaging of the abdomen and pelvis was performed following the standard protocol without IV contrast. RADIATION DOSE REDUCTION: This exam was performed according to the departmental dose-optimization program which includes automated exposure control, adjustment of the mA and/or kV according to patient size and/or use of iterative reconstruction technique. COMPARISON:  09/13/2022 FINDINGS: Lower chest: Lung bases are clear. Patient is dialysis catheter tip over the right atrium unchanged. Hepatobiliary: Minimal cholelithiasis. Liver  and biliary tree are normal. Pancreas: Normal. Spleen: Normal. Adrenals/Urinary Tract: Adrenal glands are normal. Kidneys are normal in size without hydronephrosis or nephrolithiasis. Ureters and bladder normal. Stomach/Bowel: Stomach and small bowel are normal. Appendix not visualized. Colon is normal. Vascular/Lymphatic: Noncalcified plaque of the abdominal aorta. Aorta is normal in caliber. No adenopathy. Reproductive: Normal. Other: Interval removal of peritoneal dialysis catheter. No free fluid or focal inflammatory change. Musculoskeletal: No focal abnormality. IMPRESSION: 1. No acute findings in the abdomen/pelvis. Interval removal peritoneal dialysis catheter. 2. Minimal cholelithiasis. 3. Aortic atherosclerosis. Aortic Atherosclerosis (ICD10-I70.0). Electronically Signed   By: Marin Olp M.D.   On: 12/02/2022 11:28   Medications:  sodium chloride 75 mL/hr at 12/03/22 0548   anticoagulant sodium citrate      atorvastatin  40 mg Oral Daily   Chlorhexidine Gluconate Cloth  6 each Topical Q0600   cholecalciferol  1,000 Units Oral Daily   gabapentin  200 mg Oral BID   heparin  4,000 Units Dialysis Once in dialysis   hydrALAZINE  50 mg Oral Q8H   insulin aspart  0-6 Units Subcutaneous TID WC   insulin aspart  10 Units Intravenous Once   insulin glargine-yfgn  20 Units Subcutaneous Daily   iron polysaccharides  150 mg Oral Daily   metoprolol tartrate  75 mg Oral BID   mirtazapine  15 mg Oral QHS   pantoprazole  40 mg Oral Daily   PARoxetine  20 mg Oral QHS   sevelamer carbonate  800 mg Oral TID WC   tamsulosin  0.4 mg Oral QPC supper   traZODone  100 mg Oral QHS    Dialysis Orders: [facility] MTTF 3h  400/800   90kg  2/2 bath  RIJ TDC   Hep 6000 - last HD at [facility] 2/06, post wt 89.2kg - rocaltrol 0.50 mcg po every hd - last Hb 14 on 2/6  Assessment/Plan: Chills/ N/V/ rigors/ [facility] - exam negative and CXR negative. Blood cultures pending, will need line holiday if positive. With GI symptoms  that are improving, possible gastroenteritis.  ESRD - on HD 4x per week. Missed HD 12/02/22. Stable on HD today.  HTN/ volume - BP elevated, he reports that is normal for him at start of HD. Does not appear volume overloaded. 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 Cont po vdra w/ every hd.  DM2 - insulin per primary team Hypokalemia- K+3.0, switching to higher potassium bath.   Anice Paganini, PA-C 12/03/2022, 8:44 AM  [facility] Pager: [phone]

## 2022-12-03 NOTE — Plan of Care (Signed)

## 2022-12-03 NOTE — Plan of Care (Signed)
Patient planning on discharge this evening after dose of Potassium.

## 2022-12-03 NOTE — Progress Notes (Signed)
POST HD TX VS  12/03/22 1210  Vitals  Temp 98.7 F (37.1 C)  Temp Source Oral  BP (!) 170/95  MAP (mmHg) 117  BP Location Right Arm  BP Method Automatic  Patient Position (if appropriate) Lying  Pulse Rate 80  Pulse Rate Source Monitor  ECG Heart Rate 80  Resp 16  Oxygen Therapy  SpO2 98 %  O2 Device Room Air  Patient Activity (if Appropriate) In bed  Pulse Oximetry Type Continuous  During Treatment Monitoring  Intra-Hemodialysis Comments (S)   (post HD tx VS check)  Post Treatment  Dialyzer Clearance Lightly streaked  Duration of HD Treatment -hour(s) 3.25 hour(s)  Hemodialysis Intake (mL) 0 mL  Liters Processed 72  Fluid Removed (mL) (S)  900 mL (per MD, UF goal was changed to Keep even d/t pt pre tx wt being under  the wt he had written for to achieve the UF goal of 2.5L)  Tolerated HD Treatment Yes  Post-Hemodialysis Comments (S)  tx completed w/o problem, UF goal of keep even met, blood rinsed back. keep even order was received after tx was started so he had already removed 941m. was kept even after that. Med admin: Heparin 4000 unit bolus at beginning of tx, Heparin 2000 units bolus mid tx, Heparin Dwells 3800 units  Hemodialysis Catheter Right Subclavian  No placement date or time found.   Placed prior to admission: Yes  Orientation: Right  Access Location: Subclavian  Site Condition No complications  Blue Lumen Status Heparin locked;Dead end cap in place  Red Lumen Status Heparin locked;Dead end cap in place  Purple Lumen Status N/A  Catheter fill solution Heparin 1000 units/ml  Catheter fill volume (Arterial) 1.9 cc  Catheter fill volume (Venous) 1.9  Dressing Type Transparent  Dressing Status Antimicrobial disc in place;Clean, Dry, Intact  Drainage Description None  Dressing Change Due 12/09/22  Post treatment catheter status Capped and Clamped

## 2022-12-03 NOTE — Discharge Summary (Signed)
Physician Discharge Summary   Patient: Jose [name] MRN: [id number] DOB: 1976/02/23  Admit date:     12/02/2022  Discharge date: 12/03/22  Discharge Physician: Marylu Lund   PCP: Roddie Mc, NP   Recommendations at discharge:    Follow up with PCP in 1-2 weeks Follow up with routine HD  Discharge Diagnoses: Principal Problem:   Nausea & vomiting Active Problems:   ESRD (end stage renal disease) on dialysis [facility])   Essential hypertension   Gastroenteritis   Hematemesis  Resolved Problems:   * No resolved hospital problems. *  Hospital Course: 47 y.o. 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   Assessment and Plan: Hematemesis -Medically suspect mucous membrane damage from repeated retching and vomiting, small amount history of bleeding are blood streaks mixed with mucus and stomach content.   -Hemodynamically stable   Intractable nauseous vomiting, and severe dehydration -Suspect infectious gastroenteritis -symptoms resolved by the following day -Pt successfully advanced to soft diet without issues   IDDM with hyperglycemia -With probably mild starvation ketoacidosis -continue home regimen as tolerated   Leukocytosis -Probably reactive, no cough no fever or chills, no infiltrates on x-ray and GI symptoms appear to be gastroenteritis and self-limiting.   -trended down with out needing abx    Diabetic neuropathy -Continue gabapentin and analgesia   Anxiety/depression -Continue Paxil and as needed Xanax       Consultants: Nephrology Procedures performed:   Disposition: Home Diet recommendation:  Carb modified diet DISCHARGE MEDICATION: Allergies as of 12/03/2022       Reactions   Cranberry Itching   Hm Lidocaine Patch [lidocaine] Dermatitis   Blisters skin    Melatonin Other (See Comments)   nightmares    Nsaids Other (See Comments)   Stage 3 kidney disease   Trulicity [dulaglutide] Other (See Comments)   Night sweats  Uncontrolled tempeture         Medication List     TAKE these medications    acetaminophen 500 MG tablet Commonly known as: TYLENOL Take 500-1,000 mg by mouth every 6 (six) hours as needed for mild pain.   albuterol 108 (90 Base) MCG/ACT inhaler Commonly known as: VENTOLIN HFA Inhale 1 puff into the lungs every 4 (four) hours as needed for wheezing or shortness of breath.   ALPRAZolam 0.25 MG tablet Commonly known as: XANAX Take 0.25 mg by mouth 2 (two) times daily as needed for anxiety.   atorvastatin 40 MG tablet Commonly known as: LIPITOR Take 40 mg by mouth daily.   BD Pen Needle Nano 2nd Gen 32G X 4 MM Misc Generic drug: Insulin Pen Needle USE AS DIRECTED FOUR TIMES A DAY.   cholecalciferol 25 MCG (1000 UNIT) tablet Commonly known as: VITAMIN D3 Take 1,000 Units by mouth daily.   diclofenac Sodium 1 % Gel Commonly known as: VOLTAREN Apply 1 Application topically 2 (two) times daily as needed (shoulder pain).   FreeStyle Libre 3 Sensor Misc 1 Device by Does not apply route every 14 (fourteen) days. Apply 1 sensor on upper arm every 14 days for continuous glucose monitoring   gabapentin 100 MG capsule Commonly known as: NEURONTIN Take 2 capsules (200 mg total) by mouth 2 (two) times daily.   hydrALAZINE 50 MG tablet Commonly known as: APRESOLINE Take 1 tablet (50 mg total) by mouth every 8 (eight) hours. What changed:  when to take this additional instructions  hydrOXYzine 25 MG tablet Commonly known as: ATARAX TAKE 1 TABLET(25 MG) BY MOUTH THREE TIMES DAILY AS NEEDED FOR ANXIETY OR NAUSEA   insulin lispro 100 UNIT/ML KwikPen Commonly known as: HumaLOG KwikPen Inject 10 Units into the skin 3 (three) times daily. What changed:  how much to take when to take this   iron polysaccharides 150 MG capsule Commonly known as: NIFEREX Take  1 capsule (150 mg total) by mouth daily. What changed:  when to take this additional instructions   metoCLOPramide 5 MG tablet Commonly known as: REGLAN Take 5 mg by mouth in the morning and at bedtime.   Metoprolol Tartrate 75 MG Tabs Take 75 mg by mouth 2 (two) times daily.   mirtazapine 15 MG tablet Commonly known as: REMERON Take 1 tablet (15 mg total) by mouth at bedtime.   oxyCODONE-acetaminophen 5-325 MG tablet Commonly known as: Percocet Take 1 tablet by mouth every 4 (four) hours as needed for severe pain.   pantoprazole 40 MG tablet Commonly known as: PROTONIX Take 1 tablet (40 mg total) by mouth daily.   PARoxetine 20 MG tablet Commonly known as: PAXIL Take 1 tablet (20 mg total) by mouth at bedtime.   polyethylene glycol 17 g packet Commonly known as: MIRALAX / GLYCOLAX Take 17 g by mouth daily as needed for moderate constipation, mild constipation or severe constipation.   prochlorperazine 5 MG tablet Commonly known as: COMPAZINE Take 1 tablet (5 mg total) by mouth every 8 (eight) hours as needed for nausea or vomiting. If having nausea despite Reglan   sevelamer carbonate 800 MG tablet Commonly known as: RENVELA Take 800 mg by mouth 3 (three) times daily with meals.   tamsulosin 0.4 MG Caps capsule Commonly known as: FLOMAX Take 1 capsule (0.4 mg total) by mouth daily after supper.   Torsemide 40 MG Tabs Take 40 mg by mouth daily.   Toujeo SoloStar 300 UNIT/ML Solostar Pen Generic drug: insulin glargine (1 Unit Dial) Inject 20 Units into the skin daily. Adjust as directed What changed:  how much to take when to take this additional instructions   traZODone 100 MG tablet Commonly known as: DESYREL Take 100 mg by mouth at bedtime.        Follow-up Information     Pridgen, Taylar, NP Follow up in 2 week(s).   Specialty: Family Medicine Why: Hospital follow up Contact information: [place]  [id number] [phone]                Discharge Exam: Danley Danker Weights   12/02/22 1540 12/03/22 0800 12/03/22 1210  Weight: 87.1 kg 87.1 kg 86.3 kg   General exam: Awake, laying in bed, in nad Respiratory system: Normal respiratory effort, no wheezing Cardiovascular system: regular rate, s1, s2 Gastrointestinal system: Soft, nondistended, positive BS Central nervous system: CN2-12 grossly intact, strength intact Extremities: Perfused, no clubbing Skin: Normal skin turgor, no notable skin lesions seen Psychiatry: Mood normal // no visual hallucinations   Condition at discharge: fair  The results of significant diagnostics from this hospitalization (including imaging, microbiology, ancillary and laboratory) are listed below for reference.   Imaging Studies: DG Chest Port 1 View  Result Date: 12/02/2022 CLINICAL DATA:  Dialysis patient with nausea, vomiting and diarrhea. EXAM: PORTABLE CHEST 1 VIEW COMPARISON:  Radiographs 09/15/2022 and 08/19/2022.  CT 08/19/2022. FINDINGS: 1147 hours. Slightly lower lung volumes. Right IJ hemodialysis catheter is unchanged at the level of the mid right atrium. The heart size and  mediastinal contours are stable. The lungs are clear. There is no pleural effusion or pneumothorax. No acute osseous findings are demonstrated. There are grossly stable chronic deformities in the upper thoracic spine. Telemetry leads overlie the chest. IMPRESSION: No evidence of acute cardiopulmonary process. Right IJ hemodialysis catheter unchanged in position. Electronically Signed   By: Richardean Sale M.D.   On: 12/02/2022 12:47   CT ABDOMEN PELVIS WO CONTRAST  Result Date: 12/02/2022 CLINICAL DATA:  Acute abdominal pain. EXAM: CT ABDOMEN AND PELVIS WITHOUT CONTRAST TECHNIQUE: Multidetector CT imaging of the abdomen and pelvis was performed following the standard protocol without IV contrast. RADIATION DOSE REDUCTION: This exam was performed according to the departmental  dose-optimization program which includes automated exposure control, adjustment of the mA and/or kV according to patient size and/or use of iterative reconstruction technique. COMPARISON:  09/13/2022 FINDINGS: Lower chest: Lung bases are clear. Patient is dialysis catheter tip over the right atrium unchanged. Hepatobiliary: Minimal cholelithiasis. Liver and biliary tree are normal. Pancreas: Normal. Spleen: Normal. Adrenals/Urinary Tract: Adrenal glands are normal. Kidneys are normal in size without hydronephrosis or nephrolithiasis. Ureters and bladder normal. Stomach/Bowel: Stomach and small bowel are normal. Appendix not visualized. Colon is normal. Vascular/Lymphatic: Noncalcified plaque of the abdominal aorta. Aorta is normal in caliber. No adenopathy. Reproductive: Normal. Other: Interval removal of peritoneal dialysis catheter. No free fluid or focal inflammatory change. Musculoskeletal: No focal abnormality. IMPRESSION: 1. No acute findings in the abdomen/pelvis. Interval removal peritoneal dialysis catheter. 2. Minimal cholelithiasis. 3. Aortic atherosclerosis. Aortic Atherosclerosis (ICD10-I70.0). Electronically Signed   By: Marin Olp M.D.   On: 12/02/2022 11:28    Microbiology: Results for orders placed or performed during the hospital encounter of 12/02/22  Culture, blood (Routine X 2) w Reflex to ID Panel     Status: None (Preliminary result)   Collection Time: 12/02/22  9:49 AM   Specimen: BLOOD RIGHT HAND  Result Value Ref Range Status   Specimen Description BLOOD RIGHT HAND  Final   Special Requests   Final    BOTTLES DRAWN AEROBIC AND ANAEROBIC Blood Culture adequate volume   Culture   Final    NO GROWTH < 24 HOURS Performed at [facility], [place] [address] [place], [place] [address]    Report Status PENDING  Incomplete  Culture, blood (Routine X 2) w Reflex to ID Panel     Status: None (Preliminary result)   Collection Time: 12/02/22  9:55 AM   Specimen: BLOOD  Result  Value Ref Range Status   Specimen Description BLOOD LEFT ANTECUBITAL  Final   Special Requests   Final    BOTTLES DRAWN AEROBIC AND ANAEROBIC Blood Culture adequate volume   Culture   Final    NO GROWTH < 24 HOURS Performed at [facility], [place] [address] [place], [place] [address]    Report Status PENDING  Incomplete    Labs: CBC: Recent Labs  Lab 12/02/22 0910 12/02/22 1248 12/02/22 1553 12/02/22 2145 12/03/22 0113  WBC 19.0*  --   --   --  14.9*  NEUTROABS 15.8*  --   --   --   --   HGB 13.2 10.9* 11.4* 11.1* 10.0*  HCT 36.5* 32.0* 31.5* 30.4* 28.4*  MCV 86.1  --   --   --  86.9  PLT 228  --   --   --  0000000   Basic Metabolic Panel: Recent Labs  Lab 12/02/22 0910 12/02/22 1248 12/03/22 0113  NA  133* 133* 134*  K 3.6 3.6 3.0*  CL 89*  --  97*  CO2 28  --  25  GLUCOSE 306*  --  131*  BUN 62*  --  71*  CREATININE 4.44*  --  4.52*  CALCIUM 8.9  --  8.1*   Liver Function Tests: Recent Labs  Lab 12/02/22 0910  AST 23  ALT 19  ALKPHOS 108  BILITOT 1.2  PROT 7.1  ALBUMIN 3.8   CBG: Recent Labs  Lab 12/02/22 1704 12/02/22 2017 12/03/22 0713 12/03/22 1303 12/03/22 1343  GLUCAP 213* 165* 108* 81 94    Discharge time spent: less than 30 minutes.  Signed: Marylu Lund, MD [facility] 12/03/2022

## 2022-12-03 NOTE — Progress Notes (Signed)
DISCHARGE NOTE HOME AJAX STASH to be discharged Home per MD order. Discussed prescriptions and follow up appointments with the patient. Prescriptions given to patient; medication list explained in detail. Patient verbalized understanding.  Skin clean, dry and intact without evidence of skin break down, no evidence of skin tears noted. IV catheter discontinued intact. Site without signs and symptoms of complications. Dressing and pressure applied. Pt denies pain at the site currently. No complaints noted.  Patient free of lines, drains, and wounds.   An After Visit Summary (AVS) was printed and given to the patient. Patient escorted via wheelchair, and discharged home via private auto.  Anastasio Auerbach, RN

## 2022-12-03 NOTE — Hospital Course (Signed)
47 y.o. 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

## 2022-12-03 NOTE — Progress Notes (Signed)
Pt receives out-pt HD at [place] [facility] on Monday,Tuesday,Thursday, and Friday. Pt arrives at 9:00 am. Will assist as needed.   Melven Sartorius Renal Navigator [phone]

## 2022-12-06 NOTE — Progress Notes (Signed)
Late Note Entry  Pt was d/c late Friday. Contacted TCU at [facility] this morning to advise staff of pt's d/c and that pt should have resumed care this morning.   Melven Sartorius Renal Navigator [phone]

## 2022-12-07 LAB — CULTURE, BLOOD (ROUTINE X 2)
Culture: NO GROWTH
Culture: NO GROWTH
Special Requests: ADEQUATE
Special Requests: ADEQUATE

## 2022-12-21 ENCOUNTER — Ambulatory Visit: Payer: 59 | Admitting: Vascular Surgery

## 2023-01-07 ENCOUNTER — Other Ambulatory Visit (INDEPENDENT_AMBULATORY_CARE_PROVIDER_SITE_OTHER): Payer: 59

## 2023-01-07 DIAGNOSIS — E1165 Type 2 diabetes mellitus with hyperglycemia: Secondary | ICD-10-CM

## 2023-01-07 DIAGNOSIS — Z794 Long term (current) use of insulin: Secondary | ICD-10-CM | POA: Diagnosis not present

## 2023-01-07 LAB — GLUCOSE, RANDOM: Glucose, Bld: 161 mg/dL — ABNORMAL HIGH (ref 70–99)

## 2023-01-07 LAB — HEMOGLOBIN A1C: Hgb A1c MFr Bld: 7.6 % — ABNORMAL HIGH (ref 4.6–6.5)

## 2023-01-08 LAB — FRUCTOSAMINE: Fructosamine: 286 umol/L — ABNORMAL HIGH (ref 0–285)

## 2023-01-10 ENCOUNTER — Ambulatory Visit (INDEPENDENT_AMBULATORY_CARE_PROVIDER_SITE_OTHER): Payer: 59 | Admitting: Endocrinology

## 2023-01-10 ENCOUNTER — Encounter: Payer: Self-pay | Admitting: Endocrinology

## 2023-01-10 VITALS — BP 148/100 | HR 73 | Ht 71.0 in | Wt 199.6 lb

## 2023-01-10 DIAGNOSIS — E782 Mixed hyperlipidemia: Secondary | ICD-10-CM

## 2023-01-10 DIAGNOSIS — Z794 Long term (current) use of insulin: Secondary | ICD-10-CM

## 2023-01-10 DIAGNOSIS — E1165 Type 2 diabetes mellitus with hyperglycemia: Secondary | ICD-10-CM

## 2023-01-10 DIAGNOSIS — Z992 Dependence on renal dialysis: Secondary | ICD-10-CM

## 2023-01-10 DIAGNOSIS — E1122 Type 2 diabetes mellitus with diabetic chronic kidney disease: Secondary | ICD-10-CM

## 2023-01-10 DIAGNOSIS — N186 End stage renal disease: Secondary | ICD-10-CM

## 2023-01-10 DIAGNOSIS — I120 Hypertensive chronic kidney disease with stage 5 chronic kidney disease or end stage renal disease: Secondary | ICD-10-CM

## 2023-01-10 MED ORDER — FREESTYLE LIBRE 3 SENSOR MISC: 1.0000 | 5 refills | Status: AC

## 2023-01-10 NOTE — Patient Instructions (Signed)
6-7 in am and 7-8 at supper

## 2023-01-10 NOTE — Progress Notes (Signed)
Patient ID: Jose [name], male   DOB: 08-04-1976, 47 y.o.   MRN: [id number]           Reason for Appointment: Type II Diabetes follow-up   History of Present Illness   Diagnosis date: age 1  Previous history:  He was initially put on medications at the time of diagnosis but only in his early 22s  Non-insulin hypoglycemic drugs previously used: Trulicity, Byetta, Amaryl, metformin, Tradjenta, Farxiga and Invokana Insulin was started in 2022 He has previously been taking Levemir and Lantus insulin as well as NovoLog  A1c range in the last few years is:7-14.2  Recent history:     Non-insulin hypoglycemic drugs: None     Insulin regimen: Toujeo 14 units of insulin before dinner, mealtime Humalog insulin usually 5 units    Side effects from medications: Trulicity caused nausea and tremor  Current self management, blood sugar patterns and problems identified:  A1c is 7.6 He has switched to basal bolus insulin regimen as recommended on the last visit instead of premixed insulin With this his blood sugar control is better especially fasting readings which were averaging about 190 on the premixed insulin Currently not taking enough mealtime coverage and is mostly taking Humalog small doses based on Premeal blood sugar and not what he is planning to eat With this his postprandial readings are generally rising excessively except when he is eating salads He is mostly eating 2 meals a day now He is running his dialysis fluid during the day and not at night and does not appear to have much change in his blood sugar with this Currently is needing small doses of both basal and bolus insulins However appears to have fairly significant dawn phenomenon or possibly a rise in the morning from starting dialysis His weight fluctuates based on his fluid status  Exercise: Minimal  Diet management: Mostly eating 2 meals     Hypoglycemia: As above  Freestyle libre 3 analysis for the last 2  weeks as follows  Blood sugars from about midnight decline progressively until about 5 AM with the lowest average about 102 Currently has minimal hypoglycemia with only 1 significant episode on 3/9 and also 3/10 right after midnight  Blood sugars in the mornings are rising progressively until almost 12 noon Postprandial hyperglycemia after breakfast very modest with some increase usually above 180, Premeal readings are also relatively high Blood sugars are then variably higher in the afternoons until dinnertime Postprandial readings after dinner are fairly consistently going up on most days with frequent readings in the mid 200s range   CGM use % of time   2-week average/GV 143/33  Time in range      74  %  % Time Above 180 21+2  % Time above 250   % Time Below 70 3     PRE-MEAL AC breakfast Lunch Dinner 4-6 AM Overall  Glucose range:       Averages: 132  138 102    POST-MEAL PC Breakfast PC Lunch PC Dinner  Glucose range:     Averages: 173  190   Prior   CGM use % of time 36  2-week average/GV 200/29  Time in range       43%  % Time Above 180 38  % Time above 250 19  % Time Below 70      PRE-MEAL Fasting Lunch Dinner Bedtime Overall  Glucose range:       Averages: 193    200  POST-MEAL PC Breakfast PC Lunch PC Dinner  Glucose range:     Averages:  199 230      Weight control:  Wt Readings from Last 3 Encounters:  01/10/23 199 lb 9.6 oz (90.5 kg)  12/03/22 190 lb 4.1 oz (86.3 kg)  10/19/22 205 lb (93 kg)            Diabetes labs: A1c recently was 8.2 at dialysis center  Lab Results  Component Value Date   HGBA1C 7.6 (H) 01/07/2023   HGBA1C 7.8 (H) 09/27/2022   HGBA1C 7.7 (H) 06/17/2022   Lab Results  Component Value Date   LDLCALC 57 11/22/2019   CREATININE 4.52 (H) 12/03/2022   Lab Results  Component Value Date   FRUCTOSAMINE 286 (H) 01/07/2023   FRUCTOSAMINE 387 (H) 04/23/2022     Allergies as of 01/10/2023       Reactions   Cranberry  Itching   Hm Lidocaine Patch [lidocaine] Dermatitis   Blisters skin    Melatonin Other (See Comments)   nightmares   Nsaids Other (See Comments)   Stage 3 kidney disease   Trulicity [dulaglutide] Other (See Comments)   Night sweats  Uncontrolled tempeture         Medication List        Accurate as of January 10, 2023  4:40 PM. If you have any questions, ask your nurse or doctor.          acetaminophen 500 MG tablet Commonly known as: TYLENOL Take 500-1,000 mg by mouth every 6 (six) hours as needed for mild pain.   albuterol 108 (90 Base) MCG/ACT inhaler Commonly known as: VENTOLIN HFA Inhale 1 puff into the lungs every 4 (four) hours as needed for wheezing or shortness of breath.   ALPRAZolam 0.25 MG tablet Commonly known as: XANAX Take 0.25 mg by mouth 2 (two) times daily as needed for anxiety.   atorvastatin 40 MG tablet Commonly known as: LIPITOR Take 40 mg by mouth daily.   BD Pen Needle Nano 2nd Gen 32G X 4 MM Misc Generic drug: Insulin Pen Needle USE AS DIRECTED FOUR TIMES A DAY.   cholecalciferol 25 MCG (1000 UNIT) tablet Commonly known as: VITAMIN D3 Take 1,000 Units by mouth daily.   diclofenac Sodium 1 % Gel Commonly known as: VOLTAREN Apply 1 Application topically 2 (two) times daily as needed (shoulder pain).   FreeStyle Libre 3 Sensor Misc 1 Device by Does not apply route every 14 (fourteen) days. Apply 1 sensor on upper arm every 14 days for continuous glucose monitoring   gabapentin 100 MG capsule Commonly known as: NEURONTIN Take 2 capsules (200 mg total) by mouth 2 (two) times daily.   hydrALAZINE 50 MG tablet Commonly known as: APRESOLINE Take 1 tablet (50 mg total) by mouth every 8 (eight) hours. What changed:  when to take this additional instructions   hydrOXYzine 25 MG tablet Commonly known as: ATARAX TAKE 1 TABLET(25 MG) BY MOUTH THREE TIMES DAILY AS NEEDED FOR ANXIETY OR NAUSEA   insulin lispro 100 UNIT/ML KwikPen Commonly  known as: HumaLOG KwikPen Inject 10 Units into the skin 3 (three) times daily. What changed:  how much to take when to take this   iron polysaccharides 150 MG capsule Commonly known as: NIFEREX Take 1 capsule (150 mg total) by mouth daily. What changed:  when to take this additional instructions   metoCLOPramide 5 MG tablet Commonly known as: REGLAN Take 5 mg by mouth in the morning and  at bedtime.   Metoprolol Tartrate 75 MG Tabs Take 75 mg by mouth 2 (two) times daily.   mirtazapine 15 MG tablet Commonly known as: REMERON Take 1 tablet (15 mg total) by mouth at bedtime.   oxyCODONE-acetaminophen 5-325 MG tablet Commonly known as: Percocet Take 1 tablet by mouth every 4 (four) hours as needed for severe pain.   pantoprazole 40 MG tablet Commonly known as: PROTONIX Take 1 tablet (40 mg total) by mouth daily.   PARoxetine 20 MG tablet Commonly known as: PAXIL Take 1 tablet (20 mg total) by mouth at bedtime.   polyethylene glycol 17 g packet Commonly known as: MIRALAX / GLYCOLAX Take 17 g by mouth daily as needed for moderate constipation, mild constipation or severe constipation.   prochlorperazine 5 MG tablet Commonly known as: COMPAZINE Take 1 tablet (5 mg total) by mouth every 8 (eight) hours as needed for nausea or vomiting. If having nausea despite Reglan   sevelamer carbonate 800 MG tablet Commonly known as: RENVELA Take 800 mg by mouth 3 (three) times daily with meals.   tamsulosin 0.4 MG Caps capsule Commonly known as: FLOMAX Take 1 capsule (0.4 mg total) by mouth daily after supper.   Torsemide 40 MG Tabs Take 40 mg by mouth daily.   Toujeo SoloStar 300 UNIT/ML Solostar Pen Generic drug: insulin glargine (1 Unit Dial) Inject 20 Units into the skin daily. Adjust as directed What changed:  how much to take when to take this additional instructions   traZODone 100 MG tablet Commonly known as: DESYREL Take 100 mg by mouth at bedtime.         Allergies:  Allergies  Allergen Reactions   Cranberry Itching   Hm Lidocaine Patch [Lidocaine] Dermatitis    Blisters skin    Melatonin Other (See Comments)    nightmares   Nsaids Other (See Comments)    Stage 3 kidney disease   Trulicity [Dulaglutide] Other (See Comments)    Night sweats  Uncontrolled tempeture     Past Medical History:  Diagnosis Date   Anemia    ESRD on hemodialysis ([place])    started in Oct 2023, HD at [facility]   Family history of adverse reaction to anesthesia    daughter age 78 months , coded after recieving 1 injection  for surgery - an inhalation med and morphine   GAD (generalized anxiety disorder)    Gastroparesis    Headache    Hyperlipidemia    Hypertension    Macular degeneration, bilateral    Pneumonia    Retinopathy    Type II diabetes mellitus with complication, uncontrolled    retinopathy, neuropathy, microalbuminuria    Past Surgical History:  Procedure Laterality Date   APPENDECTOMY     AV FISTULA PLACEMENT Left 10/13/2022   Procedure: LEFT ARM ARTERIOVENOUS (AV) GRAFT CREATION;  Surgeon: [name], MD;  Location: [place];  Service: Vascular;  Laterality: Left;   BIOPSY  08/10/2021   Procedure: BIOPSY;  Surgeon: Otis Brace, MD;  Location: [place];  Service: Gastroenterology;;   BUBBLE STUDY  07/29/2021   Procedure: BUBBLE STUDY;  Surgeon: Jerline Pain, MD;  Location: [place] [facility];  Service: Cardiovascular;;   CAPD INSERTION N/A 08/25/2022   Procedure: LAPAROSCOPIC INSERTION CONTINUOUS AMBULATORY PERITONEAL DIALYSIS  (CAPD) CATHETER WITH OMENTOPEXY;  Surgeon: [name], MD;  Location: [place];  Service: Vascular;  Laterality: N/A;   CAPD REMOVAL N/A 10/13/2022   Procedure: LAPAROSCOPIC REMOVAL OF CONTINUOUS AMBULATORY PERITONEAL  DIALYSIS  (CAPD) CATHETER;  Surgeon: [name], MD;  Location: [facility] OR;  Service: Vascular;  Laterality: N/A;   ESOPHAGOGASTRODUODENOSCOPY (EGD) WITH PROPOFOL N/A 08/10/2021    Procedure: ESOPHAGOGASTRODUODENOSCOPY (EGD) WITH PROPOFOL;  Surgeon: Otis Brace, MD;  Location: [place];  Service: Gastroenterology;  Laterality: N/A;   ESOPHAGOGASTRODUODENOSCOPY (EGD) WITH PROPOFOL N/A 08/24/2021   Procedure: ESOPHAGOGASTRODUODENOSCOPY (EGD) WITH PROPOFOL;  Surgeon: Otis Brace, MD;  Location: [place];  Service: Gastroenterology;  Laterality: N/A;   HERNIA REPAIR     IR FLUORO GUIDED NEEDLE PLC ASPIRATION/INJECTION LOC  07/28/2021   LUMBAR LAMINECTOMY/DECOMPRESSION MICRODISCECTOMY N/A 08/07/2021   Procedure: THORACIC FOUR - THORACIC FIVE LAMINECTOMY/DECOMPRESSION OF SPINAL CORD, DEBRIDEMENT OF ABSCESS, MICRODISCECTOMY, INTRAOPERATIVE ULTRASOUND;  Surgeon: Consuella Lose, MD;  Location: [place];  Service: Neurosurgery;  Laterality: N/A;   TEE WITHOUT CARDIOVERSION N/A 07/29/2021   Procedure: TRANSESOPHAGEAL ECHOCARDIOGRAM (TEE);  Surgeon: Jerline Pain, MD;  Location: [facility];  Service: Cardiovascular;  Laterality: N/A;   TRIGGER FINGER RELEASE Right 10/25/2019   Procedure: RIGHT INDEX FINGER RELEASE TRIGGER FINGER/A-1 PULLEY;  Surgeon: Daryll Brod, MD;  Location: [place];  Service: Orthopedics;  Laterality: Right;  IV REGIONAL FOREARM BLOCK    Family History  Problem Relation Age of Onset   Diabetes Mother    Hyperlipidemia Mother    Stroke Mother    Diabetes Father    Hyperlipidemia Brother    Stroke Brother    ADD / ADHD Brother    ADD / ADHD Son     Social History:  reports that he quit smoking about 2 years ago. His smoking use included cigarettes. He has a 20.00 pack-year smoking history. He has never used smokeless tobacco. He reports that he does not currently use alcohol. He reports that he does not use drugs.  Review of Systems:  He is having intermittent nausea and has gastroparesis reportedly  Last diabetic eye exam date 1/21  Last foot exam date: 8/22  Hypertension:   Currently only on metoprolol,  blood pressure fluctuates throughout the day   BP Readings from Last 3 Encounters:  01/10/23 (!) 148/100  12/03/22 130/84  10/19/22 (!) 198/123   He is doing peritoneal dialysis for his CKD  Lab Results  Component Value Date   CREATININE 4.52 (H) 12/03/2022   CREATININE 4.44 (H) 12/02/2022   CREATININE 3.40 (H) 10/13/2022     Lipid management: Managed with atorvastatin 40 mg daily    Lab Results  Component Value Date   CHOL 116 11/22/2019   CHOL 110 07/20/2019   CHOL 101 12/12/2018   Lab Results  Component Value Date   HDL 42 11/22/2019   HDL 38 (L) 07/20/2019   HDL 47 12/12/2018   Lab Results  Component Value Date   LDLCALC 57 11/22/2019   LDLCALC 46 07/20/2019   LDLCALC 40 12/12/2018   Lab Results  Component Value Date   TRIG 174 (H) 08/17/2021   TRIG 181 (H) 08/14/2021   TRIG 140 05/18/2021   Lab Results  Component Value Date   CHOLHDL 2.8 11/22/2019   CHOLHDL 2.9 07/20/2019   CHOLHDL 2.1 12/12/2018   Lab Results  Component Value Date   LDLDIRECT 115.0 04/23/2022     Examination:   BP (!) 148/100 (BP Location: Right Arm, Patient Position: Sitting, Cuff Size: Normal)   Pulse 73   Ht 5\' 11"  (1.803 m)   Wt 199 lb 9.6 oz (90.5 kg)   SpO2 99%   BMI 27.84 kg/m  Body mass index is 27.84 kg/m.    ASSESSMENT/ PLAN:    Diabetes type 2 on insulin:   Current regimen: Toujeo 14 units, Humalog 5 units before meals  See history of present illness for detailed discussion of current diabetes management, blood sugar patterns and problems identified  A1c is 7.6 with fructosamine 286   Blood sugars were analyzed from his freestyle [employer] Day-to-day management discussed in detail  Previously blood sugars are averaging about 200 but now about 143 Most of his hyperglycemia is postprandial although tends to have some rise in blood sugar during the day probably from the dialysis fluid However overall control is fairly good with time in range  74% Has only sporadic hypoglycemia possibly from late Humalog or overestimating mealtime coverage   Recommendations:   Discussed adjustment of Toujeo based on fasting readings and may need to reduce if blood sugars during the night tend to be below 70 Try to take a Humalog insulin a few minutes before eating Likely needs at least 6 to 7 units to cover breakfast and take this based on what he is planning to eat Also likely needs 7 or 8 units instead of 5 at suppertime unless eating salads For relatively big meals or eating out can take 10 units Humalog at suppertime To take insulin with him when eating out To call if having tendency to significant hypoglycemia or hyperglycemia Balanced meals with some protein at each meal  Patient Instructions  6-7 in am and 7-8 at supper  Total visit including counseling = 30 minutes  Elayne Snare 01/10/2023, 4:40 PM

## 2023-01-13 ENCOUNTER — Encounter: Payer: Self-pay | Admitting: Endocrinology

## 2023-05-12 ENCOUNTER — Ambulatory Visit: Payer: 59 | Admitting: "Endocrinology

## 2023-05-12 ENCOUNTER — Encounter: Payer: Self-pay | Admitting: "Endocrinology

## 2023-05-12 VITALS — BP 145/90 | HR 66 | Ht 71.0 in | Wt 185.4 lb

## 2023-05-12 DIAGNOSIS — N186 End stage renal disease: Secondary | ICD-10-CM

## 2023-05-12 DIAGNOSIS — I120 Hypertensive chronic kidney disease with stage 5 chronic kidney disease or end stage renal disease: Secondary | ICD-10-CM

## 2023-05-12 DIAGNOSIS — Z992 Dependence on renal dialysis: Secondary | ICD-10-CM

## 2023-05-12 DIAGNOSIS — E1122 Type 2 diabetes mellitus with diabetic chronic kidney disease: Secondary | ICD-10-CM | POA: Diagnosis not present

## 2023-05-12 LAB — POCT GLYCOSYLATED HEMOGLOBIN (HGB A1C): Hemoglobin A1C: 6.8 % — AB (ref 4.0–5.6)

## 2023-05-12 NOTE — Progress Notes (Signed)
Outpatient Endocrinology Note [name], MD  05/12/23   Jose [name] 17-Sep-1976 [id number]  Referring Provider: Inez Pilgrim, NP Primary Care Provider: Inez Pilgrim, NP Reason for consultation: Subjective   Assessment & Plan  Jose [name] was seen today for diabetes.  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) -     POCT glycosylated hemoglobin (Hb A1C)    Diabetes Type II complicated by 6.8, No results found for: "GFR" Hba1c goal less than 7, current Hba1c is  Lab Results  Component Value Date   HGBA1C 6.8 (A) 05/12/2023   Will recommend the following: Toujeo 8 units of insulin qam Humalog insulin 4 units with lunch, 5 units with break fast and 5-6 units with dinner-15 min before meals  Target blood sugar is 120-150 two hours after meals  No known contraindications to any of above medications  -Last LD and Tg are as follows: Lab Results  Component Value Date   LDLCALC 57 11/22/2019    Lab Results  Component Value Date   TRIG 174 (H) 08/17/2021   -not on statin since on peritoneal dialysis  -Follow low fat diet and exercise   -On peritoneal dialysis due to ESRD due to diabetes  Reviewed and counseled on: -A1C target -Blood sugar targets -Complications of uncontrolled diabetes  -Checking blood sugar before meals and bedtime and bring log next visit -All medications with mechanism of action and side effects -Hypoglycemia management: rule of 15's, Glucagon Emergency Kit and medical alert ID -low-carb low-fat plate-method diet -At least 20 minutes of physical activity per day -Annual dilated retinal eye exam and foot exam -compliance and follow up needs -follow up as scheduled or earlier if problem gets worse  Call if blood sugar is less than 70 or consistently above 250    Take a 15 gm snack of carbohydrate at bedtime before you go to sleep if your blood sugar is less than 100.    If you  are going to fast after midnight for a test or procedure, ask your physician for instructions on how to reduce/decrease your insulin dose.    Call if blood sugar is less than 70 or consistently above 250  -Treating a low sugar by rule of 15  (15 gms of sugar every 15 min until sugar is more than 70) If you feel your sugar is low, test your sugar to be sure If your sugar is low (less than 70), then take 15 grams of a fast acting Carbohydrate (3-4 glucose tablets or glucose gel or 4 ounces of juice or regular soda) Recheck your sugar 15 min after treating low to make sure it is more than 70 If sugar is still less than 70, treat again with 15 grams of carbohydrate          Don't drive the hour of hypoglycemia  If unconscious/unable to eat or drink by mouth, use glucagon injection or nasal spray baqsimi and call 911. Can repeat again in 15 min if still unconscious.  Return in about 4 months (around 09/12/2023).   I have reviewed current medications, nurse's notes, allergies, vital signs, past medical and surgical history, family medical history, and social history for this encounter. Counseled patient on symptoms, examination findings, lab findings, imaging results, treatment decisions and monitoring and prognosis. The patient understood the recommendations and agrees with the treatment plan. All questions regarding treatment plan were fully answered.  Altamese [place], MD  05/12/23    History of Present Illness Jose [name] is a 47 y.o. year old male who presents for evaluation of Type II diabetes mellitus.  AGAMJOT KILGALLON was first diagnosed at age 8.   Diabetes education +  Home diabetes regimen: Toujeo 8 units of insulin qam Humalog insulin 4 units with lunch, 5 units with break fast and dinner-15 min before meals  Target blood sugar is 120-150 two hours after meals    Previous history: He was initially put on medications at the time of diagnosis but only in his early  51s Non-insulin hypoglycemic drugs previously used: Trulicity, Byetta, Amaryl, metformin, Tradjenta, Farxiga and Invokana Insulin was started in 2022 He has previously been taking Levemir and Lantus insulin as well as NovoLog Side effects from medications: Trulicity caused nausea and tremor  COMPLICATIONS -  MI/Stroke +  retinopathy +  neuropathy +  nephropathy, on peritoneal dialysis   SYMPTOMS REVIEWED - Weight loss - Blurred vision  BLOOD SUGAR DATA  CGM interpretation: At today's visit, we reviewed her CGM downloads. The full report is scanned in the media. Reviewing the CGM trends, BG are elevated after break fast and dinner.   Physical Exam  BP (!) 145/90   Pulse 66   Ht 5\' 11"  (1.803 m)   Wt 185 lb 6.4 oz (84.1 kg)   SpO2 98%   BMI 25.86 kg/m    Constitutional: well developed, well nourished Head: normocephalic, atraumatic Eyes: sclera anicteric, no redness Neck: supple Lungs: normal respiratory effort Neurology: alert and oriented Skin: dry, no appreciable rashes Musculoskeletal: no appreciable defects Psychiatric: normal mood and affect Diabetic Foot Exam - Simple   No data filed      Current Medications Patient's Medications  New Prescriptions   No medications on file  Previous Medications   ACETAMINOPHEN (TYLENOL) 500 MG TABLET    Take 500-1,000 mg by mouth every 6 (six) hours as needed for mild pain.   ALBUTEROL (VENTOLIN HFA) 108 (90 BASE) MCG/ACT INHALER    Inhale 1 puff into the lungs every 4 (four) hours as needed for wheezing or shortness of breath.   ALPRAZOLAM (XANAX) 0.25 MG TABLET    Take 0.25 mg by mouth 2 (two) times daily as needed for anxiety.   ATORVASTATIN (LIPITOR) 40 MG TABLET    Take 40 mg by mouth daily.   CHOLECALCIFEROL (VITAMIN D3) 25 MCG (1000 UNIT) TABLET    Take 1,000 Units by mouth daily.   CONTINUOUS BLOOD GLUC SENSOR (FREESTYLE LIBRE 3 SENSOR) MISC    1 Device by Does not apply route every 14 (fourteen) days. Apply 1 sensor  on upper arm every 14 days for continuous glucose monitoring   DICLOFENAC SODIUM (VOLTAREN) 1 % GEL    Apply 1 Application topically 2 (two) times daily as needed (shoulder pain).   GABAPENTIN (NEURONTIN) 100 MG CAPSULE    Take 2 capsules (200 mg total) by mouth 2 (two) times daily.   HYDRALAZINE (APRESOLINE) 50 MG TABLET    Take 1 tablet (50 mg total) by mouth every 8 (eight) hours.   HYDROXYZINE (ATARAX) 25 MG TABLET    TAKE 1 TABLET(25 MG) BY MOUTH THREE TIMES DAILY AS NEEDED FOR ANXIETY OR NAUSEA   INSULIN GLARGINE, 1 UNIT DIAL, (TOUJEO SOLOSTAR) 300 UNIT/ML SOLOSTAR PEN    Inject 20 Units into the skin daily. Adjust as directed   INSULIN LISPRO (HUMALOG KWIKPEN) 100 UNIT/ML KWIKPEN    Inject 10 Units into the skin 3 (  three) times daily.   INSULIN PEN NEEDLE (BD PEN NEEDLE NANO 2ND GEN) 32G X 4 MM MISC    USE AS DIRECTED FOUR TIMES A DAY.   IRON POLYSACCHARIDES (NIFEREX) 150 MG CAPSULE    Take 1 capsule (150 mg total) by mouth daily.   METOCLOPRAMIDE (REGLAN) 5 MG TABLET    Take 5 mg by mouth in the morning and at bedtime.   METOPROLOL TARTRATE 75 MG TABS    Take 75 mg by mouth 2 (two) times daily.   ONDANSETRON (ZOFRAN) 24 MG TABLET    Take 24 mg by mouth once.   POLYETHYLENE GLYCOL (MIRALAX / GLYCOLAX) 17 G PACKET    Take 17 g by mouth daily as needed for moderate constipation, mild constipation or severe constipation.   TAMSULOSIN (FLOMAX) 0.4 MG CAPS CAPSULE    Take 1 capsule (0.4 mg total) by mouth daily after supper.   TORSEMIDE 40 MG TABS    Take 40 mg by mouth daily.   TRAZODONE (DESYREL) 100 MG TABLET    Take 100 mg by mouth at bedtime.  Modified Medications   No medications on file  Discontinued Medications   MIRTAZAPINE (REMERON) 15 MG TABLET    Take 1 tablet (15 mg total) by mouth at bedtime.   OXYCODONE-ACETAMINOPHEN (PERCOCET) 5-325 MG TABLET    Take 1 tablet by mouth every 4 (four) hours as needed for severe pain.   PANTOPRAZOLE (PROTONIX) 40 MG TABLET    Take 1 tablet (40 mg  total) by mouth daily.   PAROXETINE (PAXIL) 20 MG TABLET    Take 1 tablet (20 mg total) by mouth at bedtime.   PROCHLORPERAZINE (COMPAZINE) 5 MG TABLET    Take 1 tablet (5 mg total) by mouth every 8 (eight) hours as needed for nausea or vomiting. If having nausea despite Reglan   SEVELAMER CARBONATE (RENVELA) 800 MG TABLET    Take 800 mg by mouth 3 (three) times daily with meals.    Allergies Allergies  Allergen Reactions   Cranberry Itching   Hm Lidocaine Patch [Lidocaine] Dermatitis    Blisters skin    Melatonin Other (See Comments)    nightmares   Nsaids Other (See Comments)    Stage 3 kidney disease   Trulicity [Dulaglutide] Other (See Comments)    Night sweats  Uncontrolled tempeture     Past Medical History Past Medical History:  Diagnosis Date   Anemia    ESRD on hemodialysis (HCC)    started in Oct 2023, HD at [facility]   Family history of adverse reaction to anesthesia    daughter age 40 months , coded after recieving 1 injection  for surgery - an inhalation med and morphine   GAD (generalized anxiety disorder)    Gastroparesis    Headache    Hyperlipidemia    Hypertension    Macular degeneration, bilateral    Pneumonia    Retinopathy    Type II diabetes mellitus with complication, uncontrolled    retinopathy, neuropathy, microalbuminuria    Past Surgical History Past Surgical History:  Procedure Laterality Date   APPENDECTOMY     AV FISTULA PLACEMENT Left 10/13/2022   Procedure: LEFT ARM ARTERIOVENOUS (AV) GRAFT CREATION;  Surgeon: Leonie Douglas, MD;  Location: [facility] OR;  Service: Vascular;  Laterality: Left;   BIOPSY  08/10/2021   Procedure: BIOPSY;  Surgeon: Kathi Der, MD;  Location: [facility] ENDOSCOPY;  Service: Gastroenterology;;   BUBBLE STUDY  07/29/2021   Procedure: BUBBLE  STUDY;  Surgeon: Jake Bathe, MD;  Location: [facility] ENDOSCOPY;  Service: Cardiovascular;;   CAPD INSERTION N/A 08/25/2022   Procedure: LAPAROSCOPIC INSERTION CONTINUOUS AMBULATORY  PERITONEAL DIALYSIS  (CAPD) CATHETER WITH OMENTOPEXY;  Surgeon: Leonie Douglas, MD;  Location: [facility] OR;  Service: Vascular;  Laterality: N/A;   CAPD REMOVAL N/A 10/13/2022   Procedure: LAPAROSCOPIC REMOVAL OF CONTINUOUS AMBULATORY PERITONEAL DIALYSIS  (CAPD) CATHETER;  Surgeon: Leonie Douglas, MD;  Location: [facility] OR;  Service: Vascular;  Laterality: N/A;   ESOPHAGOGASTRODUODENOSCOPY (EGD) WITH PROPOFOL N/A 08/10/2021   Procedure: ESOPHAGOGASTRODUODENOSCOPY (EGD) WITH PROPOFOL;  Surgeon: Kathi Der, MD;  Location: [facility] END[facility];  Service: Gastroenterology;  Laterality: N/A;   ESOPHAGOGASTRODUODENOSCOPY (EGD) WITH PROPOFOL N/A 08/24/2021   Procedure: ESOPHAGOGASTRODUODENOSCOPY (EGD) WITH PROPOFOL;  Surgeon: Kathi Der, MD;  Location: [facility];  Service: Gastroenterology;  Laterality: N/A;   HERNIA REPAIR     IR FLUORO GUIDED NEEDLE PLC ASPIRATION/INJECTION LOC  07/28/2021   LUMBAR LAMINECTOMY/DECOMPRESSION MICRODISCECTOMY N/A 08/07/2021   Procedure: THORACIC FOUR - THORACIC FIVE LAMINECTOMY/DECOMPRESSION OF SPINAL CORD, DEBRIDEMENT OF ABSCESS, MICRODISCECTOMY, INTRAOPERATIVE ULTRASOUND;  Surgeon: Lisbeth Renshaw, MD;  Location: [facility] OR;  Service: Neurosurgery;  Laterality: N/A;   TEE WITHOUT CARDIOVERSION N/A 07/29/2021   Procedure: TRANSESOPHAGEAL ECHOCARDIOGRAM (TEE);  Surgeon: Jake Bathe, MD;  Location: [facility] ENDOSCOPY;  Service: Cardiovascular;  Laterality: N/A;   TRIGGER FINGER RELEASE Right 10/25/2019   Procedure: RIGHT INDEX FINGER RELEASE TRIGGER FINGER/A-1 PULLEY;  Surgeon: Cindee Salt, MD;  Location: [facility];  Service: Orthopedics;  Laterality: Right;  IV REGIONAL FOREARM BLOCK    Family History family history includes ADD / ADHD in his brother and son; Diabetes in his father and mother; Hyperlipidemia in his brother and mother; Stroke in his brother and mother.  Social History Social History   Socioeconomic History   Marital status: Married     Spouse name: Not on file   Number of children: 2   Years of education: Not on file   Highest education level: Bachelor's degree (e.g., BA, AB, BS)  Occupational History   Occupation: Public house manager: [employer]  Tobacco Use   Smoking status: Former    Current packs/day: 0.00    Average packs/day: 1 pack/day for 20.0 years (20.0 ttl pk-yrs)    Types: Cigarettes    Start date: 07/2000    Quit date: 07/2020    Years since quitting: 2.7   Smokeless tobacco: Never  Vaping Use   Vaping status: Never Used  Substance and Sexual Activity   Alcohol use: Not Currently   Drug use: Never   Sexual activity: Yes    Partners: Female    Birth control/protection: None    Comment: with monogamous partner  Other Topics Concern   Not on file  Social History Narrative   Pt has lived majority of life in [place]. Lives at home with partner, 2 kids, 8 cats, and 1 dog.    Social Determinants of Health   Financial Resource Strain: Low Risk  (11/25/2022)   Received from [facility], [facility]   Overall Financial Resource Strain (CARDIA)    Difficulty of Paying Living Expenses: Not hard at all  Food Insecurity: No Food Insecurity (11/25/2022)   Received from [facility], [facility]   Hunger Vital Sign    Worried About Running Out of Food in the Last Year: Never true    Ran Out of Food in the Last Year: Never true  Transportation  Needs: No Transportation Needs (11/25/2022)   Received from [facility], [facility]   [facility] - Transportation    Lack of Transportation (Medical): No    Lack of Transportation (Non-Medical): No  Physical Activity: Insufficiently Active (05/05/2018)   Exercise Vital Sign    Days of Exercise per Week: 4 days    Minutes of Exercise per Session: 20 min  Stress: No Stress Concern Present (12/15/2022)   Received from [facility], [facility] - Occupational Stress Questionnaire    Feeling of  Stress : Only a little  Social Connections: Unknown (11/10/2022)   Received from [facility], [facility]   Social Network    Social Network: Not on file  Intimate Partner Violence: Unknown (11/10/2022)   Received from [facility], [facility]   HITS    Physically Hurt: Not on file    Insult or Talk Down To: Not on file    Threaten Physical Harm: Not on file    Scream or Curse: Not on file    Lab Results  Component Value Date   HGBA1C 6.8 (A) 05/12/2023   HGBA1C 7.6 (H) 01/07/2023   HGBA1C 7.8 (H) 09/27/2022   Lab Results  Component Value Date   CHOL 116 11/22/2019   Lab Results  Component Value Date   HDL 42 11/22/2019   Lab Results  Component Value Date   LDLCALC 57 11/22/2019   Lab Results  Component Value Date   TRIG 174 (H) 08/17/2021   Lab Results  Component Value Date   CHOLHDL 2.8 11/22/2019   Lab Results  Component Value Date   CREATININE 4.52 (H) 12/03/2022   No results found for: "GFR" No results found for: "MICROALBUR", "[id number]"    Component Value Date/Time   NA 134 (L) 12/03/2022 0113   NA 141 11/22/2019 1012   K 3.0 (L) 12/03/2022 0113   CL 97 (L) 12/03/2022 0113   CO2 25 12/03/2022 0113   GLUCOSE 161 (H) 01/07/2023 1355   BUN 71 (H) 12/03/2022 0113   BUN 20 11/22/2019 1012   CREATININE 4.52 (H) 12/03/2022 0113   CREATININE 2.48 (H) 12/18/2021 1407   CALCIUM 8.1 (L) 12/03/2022 0113   PROT 7.1 12/02/2022 0910   PROT 6.5 11/22/2019 1012   ALBUMIN 3.8 12/02/2022 0910   ALBUMIN 4.4 11/22/2019 1012   AST 23 12/02/2022 0910   ALT 19 12/02/2022 0910   ALKPHOS 108 12/02/2022 0910   BILITOT 1.2 12/02/2022 0910   BILITOT 0.3 11/22/2019 1012   GFRNONAA 15 (L) 12/03/2022 0113   GFRAA 82 11/22/2019 1012      Latest Ref Rng & Units 01/07/2023    1:55 PM 12/03/2022    1:13 AM 12/02/2022   12:48 PM  BMP  Glucose 70 - 99 mg/dL 409  811    BUN 6 - 20 mg/dL  71    Creatinine 9.14 - 1.24 mg/dL  7.82    Sodium 956 - 213 mmol/L  134  133    Potassium 3.5 - 5.1 mmol/L  3.0  3.6   Chloride 98 - 111 mmol/L  97    CO2 22 - 32 mmol/L  25    Calcium 8.9 - 10.3 mg/dL  8.1         Component Value Date/Time   WBC 14.9 (H) 12/03/2022 0113   RBC 3.27 (L) 12/03/2022 0113   HGB 10.0 (L) 12/03/2022 0113   HGB 13.3 05/05/2018 1211   HCT  28.4 (L) 12/03/2022 0113   HCT 40.2 05/05/2018 1211   PLT 193 12/03/2022 0113   PLT 272 05/05/2018 1211   MCV 86.9 12/03/2022 0113   MCV 88 05/05/2018 1211   MCH 30.6 12/03/2022 0113   MCHC 35.2 12/03/2022 0113   RDW 11.9 12/03/2022 0113   RDW 13.2 05/05/2018 1211   LYMPHSABS 1.4 12/02/2022 0910   LYMPHSABS 2.4 05/05/2018 1211   MONOABS 1.7 (H) 12/02/2022 0910   EOSABS 0.0 12/02/2022 0910   EOSABS 0.2 05/05/2018 1211   BASOSABS 0.0 12/02/2022 0910   BASOSABS 0.0 05/05/2018 1211     Parts of this note may have been dictated using voice recognition software. There may be variances in spelling and vocabulary which are unintentional. Not all errors are proofread. Please notify the Thereasa Parkin if any discrepancies are noted or if the meaning of any statement is not clear.

## 2023-05-12 NOTE — Patient Instructions (Addendum)
Toujeo 8 units of insulin qam Humalog insulin 4 units with lunch, 5 units with break fast and 5-6 units with dinner-15 min before meals  Target blood sugar is 120-150 two hours after meals

## 2023-06-29 ENCOUNTER — Other Ambulatory Visit (HOSPITAL_COMMUNITY): Payer: Self-pay

## 2023-06-29 MED ORDER — LAGEVRIO 200 MG PO CAPS
4.0000 | ORAL_CAPSULE | Freq: Two times a day (BID) | ORAL | 0 refills | Status: DC
  Filled 2023-06-29: qty 40, 5d supply, fill #0

## 2023-06-30 ENCOUNTER — Inpatient Hospital Stay (HOSPITAL_COMMUNITY)
Admission: EM | Admit: 2023-06-30 | Discharge: 2023-07-04 | DRG: 177 | Disposition: A | Discharge status: Home Health | Payer: 59 | Attending: Internal Medicine | Admitting: Internal Medicine

## 2023-06-30 ENCOUNTER — Other Ambulatory Visit (HOSPITAL_COMMUNITY): Payer: Self-pay

## 2023-06-30 ENCOUNTER — Emergency Department (HOSPITAL_COMMUNITY): Payer: 59

## 2023-06-30 DIAGNOSIS — E785 Hyperlipidemia, unspecified: Secondary | ICD-10-CM | POA: Diagnosis present

## 2023-06-30 DIAGNOSIS — E11319 Type 2 diabetes mellitus with unspecified diabetic retinopathy without macular edema: Secondary | ICD-10-CM | POA: Diagnosis present

## 2023-06-30 DIAGNOSIS — U071 COVID-19: Secondary | ICD-10-CM | POA: Diagnosis not present

## 2023-06-30 DIAGNOSIS — E1143 Type 2 diabetes mellitus with diabetic autonomic (poly)neuropathy: Secondary | ICD-10-CM | POA: Diagnosis present

## 2023-06-30 DIAGNOSIS — Z91018 Allergy to other foods: Secondary | ICD-10-CM

## 2023-06-30 DIAGNOSIS — I160 Hypertensive urgency: Secondary | ICD-10-CM | POA: Diagnosis present

## 2023-06-30 DIAGNOSIS — R531 Weakness: Principal | ICD-10-CM

## 2023-06-30 DIAGNOSIS — Z79899 Other long term (current) drug therapy: Secondary | ICD-10-CM

## 2023-06-30 DIAGNOSIS — D631 Anemia in chronic kidney disease: Secondary | ICD-10-CM | POA: Diagnosis present

## 2023-06-30 DIAGNOSIS — D72829 Elevated white blood cell count, unspecified: Secondary | ICD-10-CM | POA: Diagnosis present

## 2023-06-30 DIAGNOSIS — Z818 Family history of other mental and behavioral disorders: Secondary | ICD-10-CM

## 2023-06-30 DIAGNOSIS — Z91158 Patient's noncompliance with renal dialysis for other reason: Secondary | ICD-10-CM

## 2023-06-30 DIAGNOSIS — Z83438 Family history of other disorder of lipoprotein metabolism and other lipidemia: Secondary | ICD-10-CM

## 2023-06-30 DIAGNOSIS — N400 Enlarged prostate without lower urinary tract symptoms: Secondary | ICD-10-CM | POA: Diagnosis present

## 2023-06-30 DIAGNOSIS — N186 End stage renal disease: Secondary | ICD-10-CM | POA: Diagnosis present

## 2023-06-30 DIAGNOSIS — R110 Nausea: Secondary | ICD-10-CM | POA: Diagnosis present

## 2023-06-30 DIAGNOSIS — R197 Diarrhea, unspecified: Secondary | ICD-10-CM | POA: Diagnosis present

## 2023-06-30 DIAGNOSIS — E1165 Type 2 diabetes mellitus with hyperglycemia: Secondary | ICD-10-CM | POA: Diagnosis present

## 2023-06-30 DIAGNOSIS — F411 Generalized anxiety disorder: Secondary | ICD-10-CM | POA: Diagnosis present

## 2023-06-30 DIAGNOSIS — E1122 Type 2 diabetes mellitus with diabetic chronic kidney disease: Secondary | ICD-10-CM | POA: Diagnosis present

## 2023-06-30 DIAGNOSIS — E860 Dehydration: Secondary | ICD-10-CM | POA: Diagnosis present

## 2023-06-30 DIAGNOSIS — Z823 Family history of stroke: Secondary | ICD-10-CM

## 2023-06-30 DIAGNOSIS — A0839 Other viral enteritis: Secondary | ICD-10-CM | POA: Diagnosis present

## 2023-06-30 DIAGNOSIS — K3184 Gastroparesis: Secondary | ICD-10-CM | POA: Diagnosis present

## 2023-06-30 DIAGNOSIS — Z794 Long term (current) use of insulin: Secondary | ICD-10-CM

## 2023-06-30 DIAGNOSIS — I120 Hypertensive chronic kidney disease with stage 5 chronic kidney disease or end stage renal disease: Secondary | ICD-10-CM | POA: Diagnosis present

## 2023-06-30 DIAGNOSIS — Z87891 Personal history of nicotine dependence: Secondary | ICD-10-CM

## 2023-06-30 DIAGNOSIS — Z833 Family history of diabetes mellitus: Secondary | ICD-10-CM

## 2023-06-30 DIAGNOSIS — Z888 Allergy status to other drugs, medicaments and biological substances status: Secondary | ICD-10-CM

## 2023-06-30 DIAGNOSIS — Z992 Dependence on renal dialysis: Secondary | ICD-10-CM

## 2023-06-30 DIAGNOSIS — D638 Anemia in other chronic diseases classified elsewhere: Secondary | ICD-10-CM | POA: Diagnosis present

## 2023-06-30 LAB — CBC
HCT: 32.3 % — ABNORMAL LOW (ref 39.0–52.0)
Hemoglobin: 11.1 g/dL — ABNORMAL LOW (ref 13.0–17.0)
MCH: 31.4 pg (ref 26.0–34.0)
MCHC: 34.4 g/dL (ref 30.0–36.0)
MCV: 91.5 fL (ref 80.0–100.0)
Platelets: 206 10*3/uL (ref 150–400)
RBC: 3.53 MIL/uL — ABNORMAL LOW (ref 4.22–5.81)
RDW: 12.3 % (ref 11.5–15.5)
WBC: 12.8 10*3/uL — ABNORMAL HIGH (ref 4.0–10.5)
nRBC: 0 % (ref 0.0–0.2)

## 2023-06-30 LAB — COMPREHENSIVE METABOLIC PANEL
ALT: 13 U/L (ref 0–44)
AST: 16 U/L (ref 15–41)
Albumin: 3.4 g/dL — ABNORMAL LOW (ref 3.5–5.0)
Alkaline Phosphatase: 78 U/L (ref 38–126)
Anion gap: 11 (ref 5–15)
BUN: 54 mg/dL — ABNORMAL HIGH (ref 6–20)
CO2: 17 mmol/L — ABNORMAL LOW (ref 22–32)
Calcium: 8.2 mg/dL — ABNORMAL LOW (ref 8.9–10.3)
Chloride: 105 mmol/L (ref 98–111)
Creatinine, Ser: 3.95 mg/dL — ABNORMAL HIGH (ref 0.61–1.24)
GFR, Estimated: 18 mL/min — ABNORMAL LOW (ref >60)
Glucose, Bld: 249 mg/dL — ABNORMAL HIGH (ref 70–99)
Potassium: 4.8 mmol/L (ref 3.5–5.1)
Sodium: 133 mmol/L — ABNORMAL LOW (ref 135–145)
Total Bilirubin: 0.8 mg/dL (ref 0.3–1.2)
Total Protein: 6.5 g/dL (ref 6.5–8.1)

## 2023-06-30 LAB — HIV ANTIBODY (ROUTINE TESTING W REFLEX): HIV Screen 4th Generation wRfx: NONREACTIVE

## 2023-06-30 LAB — GLUCOSE, CAPILLARY
Glucose-Capillary: 155 mg/dL — ABNORMAL HIGH (ref 70–99)
Glucose-Capillary: 174 mg/dL — ABNORMAL HIGH (ref 70–99)

## 2023-06-30 MED ORDER — HEPARIN SODIUM (PORCINE) 5000 UNIT/ML IJ SOLN
5000.0000 [IU] | Freq: Three times a day (TID) | INTRAMUSCULAR | Status: DC
  Administered 2023-06-30 – 2023-07-04 (×11): 5000 [IU] via SUBCUTANEOUS
  Filled 2023-06-30 (×11): qty 1

## 2023-06-30 MED ORDER — DELFLEX-LC/1.5% DEXTROSE 344 MOSM/L IP SOLN
INTRAPERITONEAL | Status: DC
  Administered 2023-07-01: 6000 mL via INTRAPERITONEAL

## 2023-06-30 MED ORDER — MOLNUPIRAVIR 200 MG PO CAPS
4.0000 | ORAL_CAPSULE | Freq: Two times a day (BID) | ORAL | Status: DC
  Administered 2023-06-30: 800 mg via ORAL
  Filled 2023-06-30 (×2): qty 4

## 2023-06-30 MED ORDER — GUAIFENESIN ER 600 MG PO TB12
600.0000 mg | ORAL_TABLET | Freq: Two times a day (BID) | ORAL | Status: DC
  Administered 2023-06-30 – 2023-07-04 (×8): 600 mg via ORAL
  Filled 2023-06-30 (×8): qty 1

## 2023-06-30 MED ORDER — CLONIDINE HCL 0.1 MG/24HR TD PTWK
0.1000 mg | MEDICATED_PATCH | TRANSDERMAL | Status: DC
  Administered 2023-06-30: 0.1 mg via TRANSDERMAL
  Filled 2023-06-30: qty 1

## 2023-06-30 MED ORDER — ALPRAZOLAM 0.25 MG PO TABS
0.2500 mg | ORAL_TABLET | Freq: Two times a day (BID) | ORAL | Status: DC | PRN
  Administered 2023-07-01 – 2023-07-03 (×3): 0.25 mg via ORAL
  Filled 2023-06-30 (×3): qty 1

## 2023-06-30 MED ORDER — ACETAMINOPHEN 650 MG RE SUPP: 650.0000 mg | Freq: Four times a day (QID) | RECTAL | Status: DC | PRN

## 2023-06-30 MED ORDER — PROCHLORPERAZINE EDISYLATE 10 MG/2ML IJ SOLN
10.0000 mg | Freq: Four times a day (QID) | INTRAMUSCULAR | Status: DC | PRN
  Administered 2023-07-01: 10 mg via INTRAVENOUS
  Filled 2023-06-30: qty 2

## 2023-06-30 MED ORDER — INSULIN ASPART 100 UNIT/ML IJ SOLN
0.0000 [IU] | Freq: Three times a day (TID) | INTRAMUSCULAR | Status: DC
  Administered 2023-07-01: 2 [IU] via SUBCUTANEOUS
  Administered 2023-07-01 – 2023-07-04 (×2): 1 [IU] via SUBCUTANEOUS

## 2023-06-30 MED ORDER — DELFLEX-LC/2.5% DEXTROSE 394 MOSM/L IP SOLN: INTRAPERITONEAL | Status: DC

## 2023-06-30 MED ORDER — TORSEMIDE 20 MG PO TABS
20.0000 mg | ORAL_TABLET | Freq: Every day | ORAL | Status: DC
  Administered 2023-06-30 – 2023-07-01 (×2): 20 mg via ORAL
  Filled 2023-06-30 (×2): qty 1

## 2023-06-30 MED ORDER — DELFLEX-LC/1.5% DEXTROSE 344 MOSM/L IP SOLN: INTRAPERITONEAL | Status: DC

## 2023-06-30 MED ORDER — SODIUM CHLORIDE 0.9 % IV SOLN: INTRAVENOUS | Status: DC

## 2023-06-30 MED ORDER — TAMSULOSIN HCL 0.4 MG PO CAPS
0.4000 mg | ORAL_CAPSULE | Freq: Every day | ORAL | Status: DC
  Administered 2023-07-01 – 2023-07-03 (×3): 0.4 mg via ORAL
  Filled 2023-06-30 (×3): qty 1

## 2023-06-30 MED ORDER — INSULIN GLARGINE-YFGN 100 UNIT/ML ~~LOC~~ SOLN
8.0000 [IU] | Freq: Every day | SUBCUTANEOUS | Status: DC
  Administered 2023-06-30 – 2023-07-04 (×5): 8 [IU] via SUBCUTANEOUS
  Filled 2023-06-30 (×5): qty 0.08

## 2023-06-30 MED ORDER — METOPROLOL TARTRATE 25 MG PO TABS
75.0000 mg | ORAL_TABLET | Freq: Every day | ORAL | Status: DC
  Administered 2023-06-30 – 2023-07-03 (×4): 75 mg via ORAL
  Filled 2023-06-30 (×5): qty 3

## 2023-06-30 MED ORDER — ALBUTEROL SULFATE (2.5 MG/3ML) 0.083% IN NEBU: 2.5000 mg | INHALATION_SOLUTION | Freq: Four times a day (QID) | RESPIRATORY_TRACT | Status: DC | PRN

## 2023-06-30 MED ORDER — ONDANSETRON HCL 4 MG/2ML IJ SOLN
4.0000 mg | Freq: Four times a day (QID) | INTRAMUSCULAR | Status: DC | PRN
  Administered 2023-06-30 – 2023-07-01 (×2): 4 mg via INTRAVENOUS
  Filled 2023-06-30 (×2): qty 2

## 2023-06-30 MED ORDER — ONDANSETRON HCL 4 MG/2ML IJ SOLN
4.0000 mg | Freq: Once | INTRAMUSCULAR | Status: AC
  Administered 2023-06-30: 4 mg via INTRAVENOUS
  Filled 2023-06-30: qty 2

## 2023-06-30 MED ORDER — ACETAMINOPHEN 325 MG PO TABS
650.0000 mg | ORAL_TABLET | Freq: Once | ORAL | Status: AC
  Administered 2023-06-30: 650 mg via ORAL
  Filled 2023-06-30: qty 2

## 2023-06-30 MED ORDER — GENTAMICIN SULFATE 0.1 % EX CREA
1.0000 | TOPICAL_CREAM | Freq: Every day | CUTANEOUS | Status: DC
  Filled 2023-06-30: qty 15

## 2023-06-30 MED ORDER — SODIUM CHLORIDE 0.9% FLUSH
3.0000 mL | Freq: Two times a day (BID) | INTRAVENOUS | Status: DC
  Administered 2023-06-30 – 2023-07-04 (×8): 3 mL via INTRAVENOUS

## 2023-06-30 MED ORDER — INSULIN ASPART 100 UNIT/ML IJ SOLN: 10.0000 [IU] | Freq: Three times a day (TID) | INTRAMUSCULAR | Status: DC

## 2023-06-30 MED ORDER — METOPROLOL TARTRATE 25 MG PO TABS
75.0000 mg | ORAL_TABLET | Freq: Once | ORAL | Status: AC
  Administered 2023-06-30: 75 mg via ORAL
  Filled 2023-06-30: qty 3

## 2023-06-30 MED ORDER — HYDRALAZINE HCL 20 MG/ML IJ SOLN: 10.0000 mg | INTRAMUSCULAR | Status: DC | PRN

## 2023-06-30 MED ORDER — SODIUM CHLORIDE 0.9 % IV BOLUS
500.0000 mL | Freq: Once | INTRAVENOUS | Status: AC
  Administered 2023-06-30: 500 mL via INTRAVENOUS

## 2023-06-30 MED ORDER — TRAZODONE HCL 50 MG PO TABS
100.0000 mg | ORAL_TABLET | Freq: Every day | ORAL | Status: DC
  Administered 2023-06-30 – 2023-07-03 (×4): 100 mg via ORAL
  Filled 2023-06-30 (×4): qty 2

## 2023-06-30 MED ORDER — ONDANSETRON HCL 4 MG PO TABS: 4.0000 mg | ORAL_TABLET | Freq: Four times a day (QID) | ORAL | Status: DC | PRN

## 2023-06-30 MED ORDER — ACETAMINOPHEN 325 MG PO TABS
650.0000 mg | ORAL_TABLET | Freq: Four times a day (QID) | ORAL | Status: DC | PRN
  Administered 2023-07-01: 650 mg via ORAL
  Filled 2023-06-30: qty 2

## 2023-06-30 MED ORDER — METOCLOPRAMIDE HCL 10 MG PO TABS
5.0000 mg | ORAL_TABLET | Freq: Two times a day (BID) | ORAL | Status: DC
  Administered 2023-06-30 – 2023-07-01 (×2): 5 mg via ORAL
  Filled 2023-06-30 (×2): qty 1

## 2023-06-30 MED ORDER — GABAPENTIN 100 MG PO CAPS
200.0000 mg | ORAL_CAPSULE | Freq: Two times a day (BID) | ORAL | Status: DC
  Administered 2023-06-30 – 2023-07-04 (×8): 200 mg via ORAL
  Filled 2023-06-30 (×8): qty 2

## 2023-06-30 MED ORDER — GENTAMICIN SULFATE 0.1 % EX CREA
1.0000 | TOPICAL_CREAM | Freq: Every day | CUTANEOUS | Status: DC
  Administered 2023-07-01 – 2023-07-04 (×4): 1 via TOPICAL
  Filled 2023-06-30: qty 15

## 2023-06-30 NOTE — Consult Note (Addendum)
Renal Service Consult Note [facility]  Jose [name] 06/30/2023 Maree Krabbe, MD Requesting Physician: Dr. Arlyss Queen   Reason for Consult: ESRD pt on PD too weak to do his PD at home HPI: The patient is a 47 y.o. year-old w/ PMH as below who presented to ED w/ recent +test for COVID yesterday. Pt has been having fevers, body aches, chills, nausea, vomiting and diarrhea. Went for telehealth visit and was prescribed Paxlovid but hadn't been able to pick it up yet. Felt really bad and too weak to do his CCPD last night. Came to ED. In ED VSS, BP's high, didn't take BB this am. Hb 11, WBC 12K. Na 133 , glucose 249, bun 54, creat 3.95, AG 11.  In ED pt was given po metoprolol 75 mg , tylenol and zofran and 500 cc IV NS.  Pt to be admitted. We are asked to see for dialysis.   Pt seen in ED.  States this is probably his 3rd COVID infection since it started in 2020. Lives w/ his wife who also has COVID at this time. Feels dehydrated, hasn't keep anything down. He does CAPD w/ 3 exchanges every day, 3.5 hrs apart (around 9 am , 1pm, 4-5pm and 9pm come off).    ROS - denies CP, no joint pain, no HA, no blurry vision, no rash, no diarrhea, no nausea/ vomiting, no dysuria, no difficulty voiding   Past Medical History  Past Medical History:  Diagnosis Date   Anemia    ESRD on hemodialysis (HCC)    started in Oct 2023, HD at [facility]   Family history of adverse reaction to anesthesia    daughter age 38 months , coded after recieving 1 injection  for surgery - an inhalation med and morphine   GAD (generalized anxiety disorder)    Gastroparesis    Headache    Hyperlipidemia    Hypertension    Macular degeneration, bilateral    Pneumonia    Retinopathy    Type II diabetes mellitus with complication, uncontrolled    retinopathy, neuropathy, microalbuminuria   Past Surgical History  Past Surgical History:  Procedure Laterality Date   APPENDECTOMY     AV FISTULA PLACEMENT Left  10/13/2022   Procedure: LEFT ARM ARTERIOVENOUS (AV) GRAFT CREATION;  Surgeon: Leonie Douglas, MD;  Location: [facility] OR;  Service: Vascular;  Laterality: Left;   BIOPSY  08/10/2021   Procedure: BIOPSY;  Surgeon: Kathi Der, MD;  Location: [facility] ENDOSCOPY;  Service: Gastroenterology;;   BUBBLE STUDY  07/29/2021   Procedure: BUBBLE STUDY;  Surgeon: Jake Bathe, MD;  [facility]: [facility];  Service: Cardiovascular;;   CAPD INSERTION N/A 08/25/2022   Procedure: LAPAROSCOPIC INSERTION CONTINUOUS AMBULATORY PERITONEAL DIALYSIS  (CAPD) CATHETER WITH OMENTOPEXY;  Surgeon: Leonie Douglas, MD;  Location: [facility];  Service: Vascular;  Laterality: N/A;   CAPD REMOVAL N/A 10/13/2022   Procedure: LAPAROSCOPIC REMOVAL OF CONTINUOUS AMBULATORY PERITONEAL DIALYSIS  (CAPD) CATHETER;  Surgeon: Leonie Douglas, MD;  [facility]: [facility];  Service: Vascular;  Laterality: N/A;   ESOPHAGOGASTRODUODENOSCOPY (EGD) WITH PROPOFOL N/A 08/10/2021   Procedure: ESOPHAGOGASTRODUODENOSCOPY (EGD) WITH PROPOFOL;  Surgeon: Kathi Der, MD;  [facility];  Service: Gastroenterology;  Laterality: N/A;   ESOPHAGOGASTRODUODENOSCOPY (EGD) WITH PROPOFOL N/A 08/24/2021   Procedure: ESOPHAGOGASTRODUODENOSCOPY (EGD) WITH PROPOFOL;  Surgeon: Kathi Der, MD;  [facility];  Service: Gastroenterology;  Laterality: N/A;   HERNIA REPAIR     IR FLUORO GUIDED NEEDLE  PLC ASPIRATION/INJECTION LOC  07/28/2021   LUMBAR LAMINECTOMY/DECOMPRESSION MICRODISCECTOMY N/A 08/07/2021   Procedure: THORACIC FOUR - THORACIC FIVE LAMINECTOMY/DECOMPRESSION OF SPINAL CORD, DEBRIDEMENT OF ABSCESS, MICRODISCECTOMY, INTRAOPERATIVE ULTRASOUND;  Surgeon: Lisbeth Renshaw, MD;  Location: [facility] OR;  Service: Neurosurgery;  Laterality: N/A;   TEE WITHOUT CARDIOVERSION N/A 07/29/2021   Procedure: TRANSESOPHAGEAL ECHOCARDIOGRAM (TEE);  Surgeon: Jake Bathe, MD;  Location: [facility] ENDOSCOPY;  Service: Cardiovascular;  Laterality: N/A;   TRIGGER  FINGER RELEASE Right 10/25/2019   Procedure: RIGHT INDEX FINGER RELEASE TRIGGER FINGER/A-1 PULLEY;  Surgeon: Cindee Salt, MD;  Location: [facility];  Service: Orthopedics;  Laterality: Right;  IV REGIONAL FOREARM BLOCK   Family History  Family History  Problem Relation Age of Onset   Diabetes Mother    Hyperlipidemia Mother    Stroke Mother    Diabetes Father    Hyperlipidemia Brother    Stroke Brother    ADD / ADHD Brother    ADD / ADHD Son    Social History  reports that he quit smoking about 2 years ago. His smoking use included cigarettes. He started smoking about 22 years ago. He has a 20 pack-year smoking history. He has never used smokeless tobacco. He reports that he does not currently use alcohol. He reports that he does not use drugs. Allergies  Allergies  Allergen Reactions   Tirzepatide Rash   Cranberry Itching   Hm Lidocaine Patch [Lidocaine] Dermatitis    Blisters skin    Melatonin Other (See Comments)    nightmares   Nsaids Other (See Comments)    Stage 3 kidney disease   Trulicity [Dulaglutide] Other (See Comments)    Night sweats  Uncontrolled tempeture    Home medications Prior to Admission medications   Medication Sig Start Date End Date Taking? Authorizing Provider  acetaminophen (TYLENOL) 500 MG tablet Take 500-1,000 mg by mouth every 6 (six) hours as needed for mild pain.    [provider]  albuterol (VENTOLIN HFA) 108 (90 Base) MCG/ACT inhaler Inhale 1 puff into the lungs every 4 (four) hours as needed for wheezing or shortness of breath. 05/27/21   [provider]  ALPRAZolam Prudy Feeler) 0.25 MG tablet Take 0.25 mg by mouth 2 (two) times daily as needed for anxiety. 07/22/22   [provider]  atorvastatin (LIPITOR) 40 MG tablet Take 40 mg by mouth daily. Patient not taking: Reported on 05/12/2023    [provider]  cholecalciferol (VITAMIN D3) 25 MCG (1000 UNIT) tablet Take 1,000 Units by mouth daily.     [provider]  Continuous Blood Gluc Sensor (FREESTYLE LIBRE 3 SENSOR) MISC 1 Device by Does not apply route every 14 (fourteen) days. Apply 1 sensor on upper arm every 14 days for continuous glucose monitoring 01/10/23   Reather Littler, MD  diclofenac Sodium (VOLTAREN) 1 % GEL Apply 1 Application topically 2 (two) times daily as needed (shoulder pain).    [provider]  gabapentin (NEURONTIN) 100 MG capsule Take 2 capsules (200 mg total) by mouth 2 (two) times daily. 09/21/21   Love, Evlyn Kanner, PA-C  hydrALAZINE (APRESOLINE) 50 MG tablet Take 1 tablet (50 mg total) by mouth every 8 (eight) hours. Patient not taking: Reported on 05/12/2023 06/08/22   Elgergawy, Leana Roe, MD  hydrOXYzine (ATARAX) 25 MG tablet TAKE 1 TABLET(25 MG) BY MOUTH THREE TIMES DAILY AS NEEDED FOR ANXIETY OR NAUSEA Patient not taking: Reported on 05/12/2023 11/25/22   Genice Rouge, MD  insulin glargine, 1  Unit Dial, (TOUJEO SOLOSTAR) 300 UNIT/ML Solostar Pen Inject 20 Units into the skin daily. Adjust as directed Patient taking differently: Inject 12-14 Units into the skin See admin instructions. 12 units in the morning, 14 units at bedtime 10/04/22   Reather Littler, MD  insulin lispro (HUMALOG KWIKPEN) 100 UNIT/ML KwikPen Inject 10 Units into the skin 3 (three) times daily. Patient taking differently: Inject 2 Units into the skin 3 (three) times daily before meals. 10/04/22   Reather Littler, MD  Insulin Pen Needle (BD PEN NEEDLE NANO 2ND GEN) 32G X 4 MM MISC USE AS DIRECTED FOUR TIMES A DAY. 11/01/22   Reather Littler, MD  iron polysaccharides (NIFEREX) 150 MG capsule Take 1 capsule (150 mg total) by mouth daily. Patient taking differently: Take 150 mg by mouth 4 (four) times a week. On Dialysis Days Mon, Tues, Thurs, Fri 07/06/22   Marguerita Merles Latif, DO  metoCLOPramide (REGLAN) 5 MG tablet Take 5 mg by mouth in the morning and at bedtime.    [provider]  Metoprolol Tartrate 75 MG TABS Take 75 mg by mouth 2 (two)  times daily. 06/08/22   Elgergawy, Leana Roe, MD  molnupiravir EUA (LAGEVRIO) 200 MG CAPS capsule Take 4 capsules (800 mg total) by mouth every 12 (twelve) hours for 5 days. 06/29/23 07/04/23    ondansetron (ZOFRAN) 24 MG tablet Take 24 mg by mouth once.    [provider]  polyethylene glycol (MIRALAX / GLYCOLAX) 17 g packet Take 17 g by mouth daily as needed for moderate constipation, mild constipation or severe constipation. 08/29/21   Lorin Glass, MD  tamsulosin (FLOMAX) 0.4 MG CAPS capsule Take 1 capsule (0.4 mg total) by mouth daily after supper. 09/21/21   Love, Evlyn Kanner, PA-C  torsemide 40 MG TABS Take 40 mg by mouth daily. 07/06/22   Marguerita Merles Latif, DO  traZODone (DESYREL) 100 MG tablet Take 100 mg by mouth at bedtime.    [provider]     Vitals:   06/30/23 1051 06/30/23 1150 06/30/23 1452 06/30/23 1541  BP: (!) 158/81 (!) 175/99 (!) 179/88   Pulse:  92 71   Resp: 19 13 (!) 22   Temp:    98.3 F (36.8 C)  TempSrc:    Oral  SpO2: 100% 100% 100%    Exam Gen alert, no distress No rash, cyanosis or gangrene Sclera anicteric, throat clear and a bit dry No jvd or bruits Chest clear bilat to bases, no rales/ wheezing RRR no MRG Abd soft ntnd no mass or ascites +bs GU normal male MS no joint effusions or deformity Ext no LE or UE edema, no wounds or ulcers Neuro is alert, Ox 3 , nf    PD cath intact mid abd      Home meds include - albuterol, xanax, lipitor, gabapentin 200 bid, hydralazine 50 tid, insulin glargine/ lispro, metoclopramide 5mg  bid, metoprolol 75 bid, prn zofran, flomax, torsmide 40 every day, trazodone, prns      OP PD: [facility] CCPD   3 exchanges per day, goes on 9am and comes off around 9 pm    2800 cc fill volume, dwell time 3.5hrs    dry wt 83.5kg     - last Hb 10.0 on 8/07   - last mircera 30 mcg sq on 05/09/23   - last pth 223 on 8/07, no vdra    CXR - no acute disease  Assessment/ Plan: Gen'd weakness - related to acute COVID  infection  and vol depletion most likely.  Volume - looks a bit dry on exam, will give IVF's w/ NS 0.9% overnight. Reassess in the morning.  ESRD - on CAPD, manual daytime exchanges. Missed PD last night. Might miss tonight if doesn't get in a room. If so will try to do his PD tomorrow w/ the machine imitating his home CAPD schedule.  HTN - unable to keep down home BP meds, BP's high. Use IV prn's. Will add a catapres patch until BP's under control.  Anemia esrd - Hb 11, no esa needs. Follow.  MBD ckd - CCa in range, add on phos.  DM2 - per pmd      Jose Moselle  MD [facility] 06/30/2023, 4:12 PM  Recent Labs  Lab 06/30/23 1127 06/30/23 1244  HGB  --  11.1*  ALBUMIN 3.4*  --   CALCIUM 8.2*  --   CREATININE 3.95*  --   K 4.8  --    Inpatient medications:  heparin  5,000 Units Subcutaneous Q8H   insulin aspart  0-6 Units Subcutaneous TID WC   insulin aspart  10 Units Subcutaneous TID WC   sodium chloride flush  3 mL Intravenous Q12H    acetaminophen **OR** acetaminophen, albuterol, hydrALAZINE, ondansetron **OR** ondansetron (ZOFRAN) IV

## 2023-06-30 NOTE — Procedures (Signed)
I was present at this CCPD session, have reviewed the session and made  appropriate changes Vinson Moselle MD  [facility] 06/30/2023, 5:03 PM

## 2023-06-30 NOTE — H&P (Signed)
History and Physical    Patient: Jose [name] YQM:[id number] DOB: 1976-07-17 DOA: 06/30/2023 DOS: the patient was seen and examined on 06/30/2023 PCP: Inez Pilgrim, NP  Patient coming from: Home  Chief Complaint:  Chief Complaint  Patient presents with   Fever    Covid + tested yesterday symptoms presenting 2 days ago with fever N/V/DFatigue Vomited 4x since this @ 4am. Missed Dialysis since Tuesday does home dialysis 3x daily.  Stable VS by EMS BP 124/76 96 HR 97 on RA  213 BS Diabetes   HPI: Jose [name] is a 47 y.o. male with medical history significant of hypertension, hyperlipidemia, ESRD on PD, osteomyelitis of the thoracic spine, diabetes mellitus type 2, and gastroparesis who presents with complaints of fever and malaise. Patient had tested positive for COVID-19 yesterday and had been prescribed molnupiravir.  Medication was not available at his pharmacy.  Over the last 2 days he reported having chills, shortness of breath, cough, nausea, vomiting, diarrhea, body aches, and weakness.  Normally patient dialysis 3 times a day 7 days a week.  His last session of peritoneal dialysis was 2 days ago.  He was too weak to do any dialysis yesterday.  Patient makes note he has not been able to keep any significant amount of food or liquids down.  Of note patient's father-in-law just recently passed away 2 weeks ago.  In the emergency department patient was noted to be afebrile, respirations 13-25, blood pressures elevated up to 179/88, and all other vital signs relatively maintained.  Labs significant for WBC 12.8, hemoglobin 11.1, sodium 133, CO2 17, BUN 54, creatinine 3.95, glucose 249, and anion gap 17.  Chest x-ray noted no acute abnormality.  Patient had been given 500 mL of normal saline IV fluids, antiemetics, Tylenol, and metoprolol 75 mg p.o.    Review of Systems: As mentioned in the history of present illness. All other systems reviewed and are negative. Past Medical History:   Diagnosis Date   Anemia    ESRD on hemodialysis (HCC)    started in Oct 2023, HD at [facility]   Family history of adverse reaction to anesthesia    daughter age 5 months , coded after recieving 1 injection  for surgery - an inhalation med and morphine   GAD (generalized anxiety disorder)    Gastroparesis    Headache    Hyperlipidemia    Hypertension    Macular degeneration, bilateral    Pneumonia    Retinopathy    Type II diabetes mellitus with complication, uncontrolled    retinopathy, neuropathy, microalbuminuria   Past Surgical History:  Procedure Laterality Date   APPENDECTOMY     AV FISTULA PLACEMENT Left 10/13/2022   Procedure: LEFT ARM ARTERIOVENOUS (AV) GRAFT CREATION;  Surgeon: Leonie Douglas, MD;  [facility];  Service: Vascular;  Laterality: Left;   BIOPSY  08/10/2021   Procedure: BIOPSY;  Surgeon: Kathi Der, MD;  [facility]: [facility] ENDOSCOPY;  Service: Gastroenterology;;   BUBBLE STUDY  07/29/2021   Procedure: BUBBLE STUDY;  Surgeon: Jake Bathe, MD;  [facility]: [facility] ENDOS[facility];  Service: Cardiovascular;;   CAPD INSERTION N/A 08/25/2022   Procedure: LAPAROSCOPIC INSERTION CONTINUOUS AMBULATORY PERITONEAL DIALYSIS  (CAPD) CATHETER WITH OMENTOPEXY;  Surgeon: Leonie Douglas, MD;  [facility];  Service: Vascular;  Laterality: N/A;   CAPD REMOVAL N/A 10/13/2022   Procedure: LAPAROSCOPIC REMOVAL OF CONTINUOUS AMBULATORY PERITONEAL DIALYSIS  (CAPD) CATHETER;  Surgeon: Leonie Douglas, MD;  [facility];  Service: Vascular;  Laterality: N/A;   ESOPHAGOGASTRODUODENOSCOPY (EGD) WITH PROPOFOL N/A 08/10/2021   Procedure: ESOPHAGOGASTRODUODENOSCOPY (EGD) WITH PROPOFOL;  Surgeon: Kathi Der, MD;  [facility];  Service: Gastroenterology;  Laterality: N/A;   ESOPHAGOGASTRODUODENOSCOPY (EGD) WITH PROPOFOL N/A 08/24/2021   Procedure: ESOPHAGOGASTRODUODENOSCOPY (EGD) WITH PROPOFOL;  Surgeon: Kathi Der, MD;  [facility];  Service:  Gastroenterology;  Laterality: N/A;   HERNIA REPAIR     IR FLUORO GUIDED NEEDLE PLC ASPIRATION/INJECTION LOC  07/28/2021   LUMBAR LAMINECTOMY/DECOMPRESSION MICRODISCECTOMY N/A 08/07/2021   Procedure: THORACIC FOUR - THORACIC FIVE LAMINECTOMY/DECOMPRESSION OF SPINAL CORD, DEBRIDEMENT OF ABSCESS, MICRODISCECTOMY, INTRAOPERATIVE ULTRASOUND;  Surgeon: Lisbeth Renshaw, MD;  [facility]: [facility];  Service: Neurosurgery;  Laterality: N/A;   TEE WITHOUT CARDIOVERSION N/A 07/29/2021   Procedure: TRANSESOPHAGEAL ECHOCARDIOGRAM (TEE);  Surgeon: Jake Bathe, MD;  Location: [facility] ENDOSCOPY;  Service: Cardiovascular;  Laterality: N/A;   TRIGGER FINGER RELEASE Right 10/25/2019   Procedure: RIGHT INDEX FINGER RELEASE TRIGGER FINGER/A-1 PULLEY;  Surgeon: Cindee Salt, MD;  Location: [facility];  Service: Orthopedics;  Laterality: Right;  IV REGIONAL FOREARM BLOCK   Social History:  reports that he quit smoking about 2 years ago. His smoking use included cigarettes. He started smoking about 22 years ago. He has a 20 pack-year smoking history. He has never used smokeless tobacco. He reports that he does not currently use alcohol. He reports that he does not use drugs.  Allergies  Allergen Reactions   Tirzepatide Rash   Cranberry Itching   Hm Lidocaine Patch [Lidocaine] Dermatitis    Blisters skin    Melatonin Other (See Comments)    nightmares   Nsaids Other (See Comments)    Stage 3 kidney disease   Trulicity [Dulaglutide] Other (See Comments)    Night sweats  Uncontrolled tempeture     Family History  Problem Relation Age of Onset   Diabetes Mother    Hyperlipidemia Mother    Stroke Mother    Diabetes Father    Hyperlipidemia Brother    Stroke Brother    ADD / ADHD Brother    ADD / ADHD Son     Prior to Admission medications   Medication Sig Start Date End Date Taking? Authorizing Provider  acetaminophen (TYLENOL) 500 MG tablet Take 500-1,000 mg by mouth every 6 (six) hours as  needed for mild pain.    [provider]  albuterol (VENTOLIN HFA) 108 (90 Base) MCG/ACT inhaler Inhale 1 puff into the lungs every 4 (four) hours as needed for wheezing or shortness of breath. 05/27/21   [provider]  ALPRAZolam Prudy Feeler) 0.25 MG tablet Take 0.25 mg by mouth 2 (two) times daily as needed for anxiety. 07/22/22   [provider]  atorvastatin (LIPITOR) 40 MG tablet Take 40 mg by mouth daily. Patient not taking: Reported on 05/12/2023    [provider]  cholecalciferol (VITAMIN D3) 25 MCG (1000 UNIT) tablet Take 1,000 Units by mouth daily.    [provider]  Continuous Blood Gluc Sensor (FREESTYLE LIBRE 3 SENSOR) MISC 1 Device by Does not apply route every 14 (fourteen) days. Apply 1 sensor on upper arm every 14 days for continuous glucose monitoring 01/10/23   Reather Littler, MD  diclofenac Sodium (VOLTAREN) 1 % GEL Apply 1 Application topically 2 (two) times daily as needed (shoulder pain).    [provider]  gabapentin (NEURONTIN) 100 MG capsule Take 2 capsules (200 mg total) by mouth 2 (two) times daily. 09/21/21  Love, Evlyn Kanner, PA-C  hydrALAZINE (APRESOLINE) 50 MG tablet Take 1 tablet (50 mg total) by mouth every 8 (eight) hours. Patient not taking: Reported on 05/12/2023 06/08/22   Elgergawy, Leana Roe, MD  hydrOXYzine (ATARAX) 25 MG tablet TAKE 1 TABLET(25 MG) BY MOUTH THREE TIMES DAILY AS NEEDED FOR ANXIETY OR NAUSEA Patient not taking: Reported on 05/12/2023 11/25/22   Lovorn, Aundra Millet, MD  insulin glargine, 1 Unit Dial, (TOUJEO SOLOSTAR) 300 UNIT/ML Solostar Pen Inject 20 Units into the skin daily. Adjust as directed Patient taking differently: Inject 12-14 Units into the skin See admin instructions. 12 units in the morning, 14 units at bedtime 10/04/22   Reather Littler, MD  insulin lispro (HUMALOG KWIKPEN) 100 UNIT/ML KwikPen Inject 10 Units into the skin 3 (three) times daily. Patient taking differently: Inject 2 Units into the skin 3  (three) times daily before meals. 10/04/22   Reather Littler, MD  Insulin Pen Needle (BD PEN NEEDLE NANO 2ND GEN) 32G X 4 MM MISC USE AS DIRECTED FOUR TIMES A DAY. 11/01/22   Reather Littler, MD  iron polysaccharides (NIFEREX) 150 MG capsule Take 1 capsule (150 mg total) by mouth daily. Patient taking differently: Take 150 mg by mouth 4 (four) times a week. On Dialysis Days Mon, Tues, Thurs, Fri 07/06/22   Marguerita Merles Latif, DO  metoCLOPramide (REGLAN) 5 MG tablet Take 5 mg by mouth in the morning and at bedtime.    [provider]  Metoprolol Tartrate 75 MG TABS Take 75 mg by mouth 2 (two) times daily. 06/08/22   Elgergawy, Leana Roe, MD  molnupiravir EUA (LAGEVRIO) 200 MG CAPS capsule Take 4 capsules (800 mg total) by mouth every 12 (twelve) hours for 5 days. 06/29/23 07/04/23    ondansetron (ZOFRAN) 24 MG tablet Take 24 mg by mouth once.    [provider]  polyethylene glycol (MIRALAX / GLYCOLAX) 17 g packet Take 17 g by mouth daily as needed for moderate constipation, mild constipation or severe constipation. 08/29/21   Lorin Glass, MD  tamsulosin (FLOMAX) 0.4 MG CAPS capsule Take 1 capsule (0.4 mg total) by mouth daily after supper. 09/21/21   Love, Evlyn Kanner, PA-C  torsemide 40 MG TABS Take 40 mg by mouth daily. 07/06/22   Marguerita Merles Latif, DO  traZODone (DESYREL) 100 MG tablet Take 100 mg by mouth at bedtime.    [provider]    Physical Exam: Vitals:   06/30/23 1021 06/30/23 1051 06/30/23 1150  BP: (!) 163/94 (!) 158/81 (!) 175/99  Pulse: 95  92  Resp: (!) 25 19 13   Temp: 98.3 F (36.8 C)    TempSrc: Oral    SpO2: 100% 100% 100%    Constitutional: Middle-age male who appears acute distress Eyes: PERRL, lids and conjunctivae normal ENMT: Mucous membranes are moist. Posterior pharynx clear of any exudate or lesions.Normal dentition.  Neck: normal, supple, no masses, no thyromegaly Respiratory: clear to auscultation bilaterally, no wheezing, no crackles. Normal  respiratory effort. No accessory muscle use.  Cardiovascular: Regular rate and rhythm, no murmurs / rubs / gallops. No extremity edema. 2+ pedal pulses. No carotid bruits.  Abdomen: no tenderness, no masses palpated.  PD catheter in the right lower quadrant of the abdomen. Bowel sounds positive.  Musculoskeletal: no clubbing / cyanosis. No joint deformity upper and lower extremities. Good ROM, no contractures. Normal muscle tone.  Skin: no rashes, lesions, ulcers. No induration Neurologic: CN 2-12 grossly intact. Sensation intact, DTR normal. Strength 5/5 in all  4.  Psychiatric: Normal judgment and insight. Alert and oriented x 3. Normal mood.   Data Reviewed:  EKG revealed sinus rhythm 98 bpm with probable left atrial abnormality.  Assessment and Plan:  COVID-19 infection Acute.  Patient was diagnosed with COVID-19 yesterday.  Reports having  fevers, cough, nausea, vomiting, diarrhea, and bodyaches.  Chest x-ray showed no acute abnormality.  O2 saturations currently maintained on room air.  Patient is not a candidate for Paxlovid due to ESRD.  Molnupiravir had been prescribed, but family had not been able to pick up the prescription. -Admit to a medical telemetry bed -Incentive spirometry and flutter valve -Continue Molnupiravir  -Antiemetics as needed -Mucinex -PT to evaluate and treat  Leukocytosis Acute.  WBC elevated 12.8.  Thought secondary to above. -Recheck CBC tomorrow morning.  ESRD on PD Patient normally does peritoneal dialysis 3 times a day 7 days a week.  Last reported dialysis session was 2 days ago. -Nephrology consulted for need of PD.  Hypertensive urgency On admission blood pressures noted to be elevated up to 179/88. -Continue Metoprolol -Hydralazine IV as needed  Diabetes mellitus type 2,  uncontrolled On admission glucose elevated up to 249.  Last hemoglobin A1c was 6.8 on 7/18.  Patient makes note he had not taken home dose of 70/30 insulin 8  daily. -Hypoglycemic protocols -Pharmacy substitution of Semglee 8 units daily -CBGs before every meal with very sensitive SSI  Anemia of chronic disease Hemoglobin 11.1 which appears around patient's baseline. -Continue to monitor   DVT prophylaxis: Heparin Advance Care Planning:   Code Status: Full Code    Consults: Nephrology  Family Communication: Wife  Severity of Illness: The appropriate patient status for this patient is OBSERVATION. Observation status is judged to be reasonable and necessary in order to provide the required intensity of service to ensure the patient's safety. The patient's presenting symptoms, physical exam findings, and initial radiographic and laboratory data in the context of their medical condition is felt to place them at decreased risk for further clinical deterioration. Furthermore, it is anticipated that the patient will be medically stable for discharge from the hospital within 2 midnights of admission.   Author: Clydie Braun, MD 06/30/2023 2:50 PM  For on call review [email or web address].

## 2023-06-30 NOTE — Progress Notes (Signed)
PD tx initation note:   Pre TX VS: please see Data Insert  Pre TX weight: 84.1 kg  PD treatment initiated via aseptic technique. Consent signed and in chart. Patient is alert and oriented. No complaints of pain. No specimen collected. PD exit site clean, dry and intact. Gentamycin and new dressing applied. Bedside RN educated on PD machine and how to contact tech support when PD machine alarms.    06/30/23 2349  Vitals  Temp 98.6 F (37 C)  Temp Source Oral  BP (!) 149/92  MAP (mmHg) 105  BP Location Right Arm  BP Method Automatic  Patient Position (if appropriate) Lying  Pulse Rate 74  Pulse Rate Source Monitor  ECG Heart Rate 74  Resp (!) 24  Oxygen Therapy  SpO2 96 %  O2 Device Room Air  Patient Activity (if Appropriate) In bed  Pulse Oximetry Type Continuous  Time-Out for Dialysis  What Procedure? Peritoneal Dialysis  Pt Identifiers(min of two) First/Last Name;MRN/Account#  Correct Site? Yes  Correct Side? Yes  Correct Procedure? Yes  Consents Verified? Yes  Rad Studies Available? N/A  Safety Precautions Reviewed? Yes

## 2023-06-30 NOTE — ED Provider Notes (Signed)
[facility] AT [facility] Provider Note   CSN: [id number] Arrival date & time: 06/30/23  1013     History  Chief Complaint  Patient presents with   Fever    Covid + tested yesterday symptoms presenting 2 days ago with fever N/V/DFatigue Vomited 4x since this @ 4am. Missed Dialysis since Tuesday does home dialysis 3x daily.  Stable VS by EMS BP 124/76 96 HR 97 on RA  213 BS Diabetes    Jose [name] is a 47 y.o. male with medical history of type 2 diabetes, bilateral macular degeneration, hypertension, gastroparesis, anemia, end-stage renal disease on hemodialysis, peritoneal dialysis 3 times a day for 7 days a week.  The patient presents to the ED for evaluation of COVID-positive status.  The patient reports that he tested positive for COVID-19 yesterday.  He reports that for the last 2 days he has been having fevers, sore throat, body aches and chills, nausea, vomiting and diarrhea.  He was seen yesterday at a telehealth visit and prescribed Paxlovid but has not been able to pick this up yet.  He denies chest pain, shortness of breath, lightheadedness, dizziness.  He is endorsing weakness.  He states he was unable to complete peritoneal dialysis yesterday because of his symptoms.   Fever Associated symptoms: chills, diarrhea, myalgias, nausea and vomiting   Associated symptoms: no chest pain        Home Medications Prior to Admission medications   Medication Sig Start Date End Date Taking? Authorizing Provider  acetaminophen (TYLENOL) 500 MG tablet Take 500-1,000 mg by mouth every 6 (six) hours as needed for mild pain.    [provider]  albuterol (VENTOLIN HFA) 108 (90 Base) MCG/ACT inhaler Inhale 1 puff into the lungs every 4 (four) hours as needed for wheezing or shortness of breath. 05/27/21   [provider]  ALPRAZolam Prudy Feeler) 0.25 MG tablet Take 0.25 mg by mouth 2 (two) times daily as needed for anxiety. 07/22/22   [provider]  atorvastatin (LIPITOR) 40 MG tablet Take 40 mg by mouth daily. Patient not taking: Reported on 05/12/2023    [provider]  cholecalciferol (VITAMIN D3) 25 MCG (1000 UNIT) tablet Take 1,000 Units by mouth daily.    [provider]  Continuous Blood Gluc Sensor (FREESTYLE LIBRE 3 SENSOR) MISC 1 Device by Does not apply route every 14 (fourteen) days. Apply 1 sensor on upper arm every 14 days for continuous glucose monitoring 01/10/23   Reather Littler, MD  diclofenac Sodium (VOLTAREN) 1 % GEL Apply 1 Application topically 2 (two) times daily as needed (shoulder pain).    [provider]  gabapentin (NEURONTIN) 100 MG capsule Take 2 capsules (200 mg total) by mouth 2 (two) times daily. 09/21/21   Love, Evlyn Kanner, PA-C  hydrALAZINE (APRESOLINE) 50 MG tablet Take 1 tablet (50 mg total) by mouth every 8 (eight) hours. Patient not taking: Reported on 05/12/2023 06/08/22   Elgergawy, Leana Roe, MD  hydrOXYzine (ATARAX) 25 MG tablet TAKE 1 TABLET(25 MG) BY MOUTH THREE TIMES DAILY AS NEEDED FOR ANXIETY OR NAUSEA Patient not taking: Reported on 05/12/2023 11/25/22   Lovorn, Aundra Millet, MD  insulin glargine, 1 Unit Dial, (TOUJEO SOLOSTAR) 300 UNIT/ML Solostar Pen Inject 20 Units into the skin daily. Adjust as directed Patient taking differently: Inject 12-14 Units into the skin See admin instructions. 12 units in the morning, 14 units at bedtime 10/04/22   Reather Littler, MD  insulin lispro (HUMALOG  KWIKPEN) 100 UNIT/ML KwikPen Inject 10 Units into the skin 3 (three) times daily. Patient taking differently: Inject 2 Units into the skin 3 (three) times daily before meals. 10/04/22   Reather Littler, MD  Insulin Pen Needle (BD PEN NEEDLE NANO 2ND GEN) 32G X 4 MM MISC USE AS DIRECTED FOUR TIMES A DAY. 11/01/22   Reather Littler, MD  iron polysaccharides (NIFEREX) 150 MG capsule Take 1 capsule (150 mg total) by mouth daily. Patient taking differently: Take 150 mg by mouth 4 (four) times a week. On  Dialysis Days Mon, Tues, Thurs, Fri 07/06/22   Marguerita Merles Latif, DO  metoCLOPramide (REGLAN) 5 MG tablet Take 5 mg by mouth in the morning and at bedtime.    [provider]  Metoprolol Tartrate 75 MG TABS Take 75 mg by mouth 2 (two) times daily. 06/08/22   Elgergawy, Leana Roe, MD  molnupiravir EUA (LAGEVRIO) 200 MG CAPS capsule Take 4 capsules (800 mg total) by mouth every 12 (twelve) hours for 5 days. 06/29/23 07/04/23    ondansetron (ZOFRAN) 24 MG tablet Take 24 mg by mouth once.    [provider]  polyethylene glycol (MIRALAX / GLYCOLAX) 17 g packet Take 17 g by mouth daily as needed for moderate constipation, mild constipation or severe constipation. 08/29/21   Lorin Glass, MD  tamsulosin (FLOMAX) 0.4 MG CAPS capsule Take 1 capsule (0.4 mg total) by mouth daily after supper. 09/21/21   Love, Evlyn Kanner, PA-C  torsemide 40 MG TABS Take 40 mg by mouth daily. 07/06/22   Marguerita Merles Latif, DO  traZODone (DESYREL) 100 MG tablet Take 100 mg by mouth at bedtime.    [provider]      Allergies    Tirzepatide, Cranberry, Hm lidocaine patch [lidocaine], Melatonin, Nsaids, and Trulicity [dulaglutide]    Review of Systems   Review of Systems  Constitutional:  Positive for chills and fever.  Respiratory:  Negative for shortness of breath.   Cardiovascular:  Negative for chest pain.  Gastrointestinal:  Positive for diarrhea, nausea and vomiting.  Musculoskeletal:  Positive for myalgias.  Neurological:  Positive for weakness.  All other systems reviewed and are negative.   Physical Exam Updated Vital Signs BP (!) 179/88   Pulse 71   Temp 98.3 F (36.8 C) (Oral)   Resp (!) 22   SpO2 100%  Physical Exam Vitals and nursing note reviewed.  Constitutional:      General: He is not in acute distress.    Appearance: Normal appearance. He is not ill-appearing, toxic-appearing or diaphoretic.  HENT:     Head: Normocephalic and atraumatic.     Nose: Nose normal.      Mouth/Throat:     Mouth: Mucous membranes are moist.     Pharynx: Oropharynx is clear.  Eyes:     Extraocular Movements: Extraocular movements intact.     Conjunctiva/sclera: Conjunctivae normal.     Pupils: Pupils are equal, round, and reactive to light.  Cardiovascular:     Rate and Rhythm: Normal rate and regular rhythm.  Pulmonary:     Effort: Pulmonary effort is normal.     Breath sounds: Normal breath sounds. No wheezing.  Abdominal:     General: Abdomen is flat. Bowel sounds are normal.     Palpations: Abdomen is soft.     Tenderness: There is no abdominal tenderness.  Musculoskeletal:     Cervical back: Normal range of motion and neck supple. No tenderness.  Right lower leg: No edema.     Left lower leg: No edema.  Skin:    Capillary Refill: Capillary refill takes less than 2 seconds.  Neurological:     Mental Status: He is alert and oriented to person, place, and time.     ED Results / Procedures / Treatments   Labs (all labs ordered are listed, but only abnormal results are displayed) Labs Reviewed  COMPREHENSIVE METABOLIC PANEL - Abnormal; Notable for the following components:      Result Value   Sodium 133 (*)    CO2 17 (*)    Glucose, Bld 249 (*)    BUN 54 (*)    Creatinine, Ser 3.95 (*)    Calcium 8.2 (*)    Albumin 3.4 (*)    GFR, Estimated 18 (*)    All other components within normal limits  CBC - Abnormal; Notable for the following components:   WBC 12.8 (*)    RBC 3.53 (*)    Hemoglobin 11.1 (*)    HCT 32.3 (*)    All other components within normal limits    EKG EKG Interpretation Date/Time:  Thursday June 30 2023 10:20:25 EDT Ventricular Rate:  98 PR Interval:  159 QRS Duration:  92 QT Interval:  338 QTC Calculation: 432 R Axis:   72  Text Interpretation: Sinus rhythm Probable left atrial enlargement No significant change since last tracing Confirmed by Alvira Monday ([id number]) on 06/30/2023 2:27:22 PM  Radiology DG Chest  Portable 1 View  Result Date: 06/30/2023 CLINICAL DATA:  sob, covid positive EXAM: PORTABLE CHEST 1 VIEW COMPARISON:  February 2024 FINDINGS: Interval removal of tunneled hemodialysis catheter. The cardiomediastinal silhouette is within normal limits. No pleural effusion. No pneumothorax. No mass or consolidation. No acute osseous abnormality. IMPRESSION: No acute findings in the chest. Electronically Signed   By: Olive Bass M.D.   On: 06/30/2023 13:57    Procedures Procedures   Medications Ordered in ED Medications  ondansetron (ZOFRAN) injection 4 mg (4 mg Intravenous Given 06/30/23 1308)  sodium chloride 0.9 % bolus 500 mL (500 mLs Intravenous New Bag/Given 06/30/23 1310)  acetaminophen (TYLENOL) tablet 650 mg (650 mg Oral Given 06/30/23 1308)  metoprolol tartrate (LOPRESSOR) tablet 75 mg (75 mg Oral Given 06/30/23 1308)    ED Course/ Medical Decision Making/ A&P  Medical Decision Making Amount and/or Complexity of Data Reviewed Labs: ordered. Radiology: ordered.  Risk OTC drugs. Prescription drug management.   47 year old male presents to the ED for evaluation.  Please see HPI for further details.  On examination the patient is afebrile, nontachycardic.  His lung sounds are clear bilaterally, he is not hypoxic.  Abdomen is soft and compressible throughout.  No edema to bilateral lower extremities.  Neurological examination at baseline.  Patient is hypertensive but states he has not taken his home metoprolol today.  Patient CBC shows leukocytosis of 12.8, hemoglobin 11.1 per his baseline.  Metabolic panel shows sodium 133, glucose 249, BUN 54, creatinine 3.95, GFR 18 and anion gap 11.  The patient states he has not been able to complete his peritoneal dialysis over the last day.  This is most likely cause of patient lab results.  Discussed options with patient.  He states that he is feeling too weak to go home and do his PD and he feels as if he goes home he will just "get worse".  Will  attempt to admit the patient to the hospital for peritoneal dialysis and overnight observation.  Patient given home blood pressure medication 75 mg metoprolol, Tylenol, following up Zofran, 500 bag of fluid.  Patient discussed with Dr. Arlean Hopping who has agreed to have patient complete PD with nephrology.  Discussed admission with Dr. Katrinka Blazing who has agreed to admit the patient.  Patient amenable to plan.   Final Clinical Impression(s) / ED Diagnoses Final diagnoses:  Weakness  COVID-19    Rx / DC Orders ED Discharge Orders     None         Al Decant, PA-C 06/30/23 1517    Alvira Monday, MD 07/01/23 [phone]

## 2023-06-30 NOTE — ED Notes (Signed)
Patient left the floor in stable condition with his belongings and staff.

## 2023-06-30 NOTE — ED Notes (Signed)
ED TO INPATIENT HANDOFF REPORT  ED Nurse Name and Phone #: Karolee Ohs Name/Age/Gender Jose [name] 47 y.o. male Room/Bed: 025C/025C  Code Status   Code Status: Prior  Home/SNF/Other Home Patient oriented to: self, place, time, and situation Is this baseline? Yes   Triage Complete: Triage complete  Chief Complaint Weakness [R53.1]  Triage Note No notes on file   Allergies Allergies  Allergen Reactions   Tirzepatide Rash   Cranberry Itching   Hm Lidocaine Patch [Lidocaine] Dermatitis    Blisters skin    Melatonin Other (See Comments)    nightmares   Nsaids Other (See Comments)    Stage 3 kidney disease   Trulicity [Dulaglutide] Other (See Comments)    Night sweats  Uncontrolled tempeture     Level of Care/Admitting Diagnosis ED Disposition     ED Disposition  Admit   Condition  --   Comment  Hospital Area: [facility] [[id number]]  Level of Care: Telemetry Medical [104]  May place patient in observation at [facility] or [facility] if equivalent level of care is available:: No  Covid Evaluation: Asymptomatic - no recent exposure (last 10 days) testing not required  Diagnosis: Weakness [241835]  Admitting Physician: Clydie Braun [[id number]]  Attending Physician: Clydie Braun [[id number]]          B Medical/Surgery History Past Medical History:  Diagnosis Date   Anemia    ESRD on hemodialysis (HCC)    started in Oct 2023, HD at [facility]   Family history of adverse reaction to anesthesia    Jose [name] age 50 months , coded after recieving 1 injection  for surgery - an inhalation med and morphine   GAD (generalized anxiety disorder)    Gastroparesis    Headache    Hyperlipidemia    Hypertension    Macular degeneration, bilateral    Pneumonia    Retinopathy    Type II diabetes mellitus with complication, uncontrolled    retinopathy, neuropathy, microalbuminuria   Past Surgical History:  Procedure Laterality Date    APPENDECTOMY     AV FISTULA PLACEMENT Left 10/13/2022   Procedure: LEFT ARM ARTERIOVENOUS (AV) GRAFT CREATION;  Surgeon: Leonie Douglas, MD;  Location: [facility] OR;  Service: Vascular;  Laterality: Left;   BIOPSY  08/10/2021   Procedure: BIOPSY;  Surgeon: Kathi Der, MD;  Location: [facility] ENDOSCOPY;  Service: Gastroenterology;;   BUBBLE STUDY  07/29/2021   Procedure: BUBBLE STUDY;  Surgeon: Jake Bathe, MD;  Location: [facility] ENDOSCOPY;  Service: Cardiovascular;;   CAPD INSERTION N/A 08/25/2022   Procedure: LAPAROSCOPIC INSERTION CONTINUOUS AMBULATORY PERITONEAL DIALYSIS  (CAPD) CATHETER WITH OMENTOPEXY;  Surgeon: Leonie Douglas, MD;  Location: [facility] OR;  Service: Vascular;  Laterality: N/A;   CAPD REMOVAL N/A 10/13/2022   Procedure: LAPAROSCOPIC REMOVAL OF CONTINUOUS AMBULATORY PERITONEAL DIALYSIS  (CAPD) CATHETER;  Surgeon: Leonie Douglas, MD;  Location: [facility] OR;  Service: Vascular;  Laterality: N/A;   ESOPHAGOGASTRODUODENOSCOPY (EGD) WITH PROPOFOL N/A 08/10/2021   Procedure: ESOPHAGOGASTRODUODENOSCOPY (EGD) WITH PROPOFOL;  Surgeon: Kathi Der, MD;  Location: [facility] END[facility];  Service: Gastroenterology;  Laterality: N/A;   ESOPHAGOGASTRODUODENOSCOPY (EGD) WITH PROPOFOL N/A 08/24/2021   Procedure: ESOPHAGOGASTRODUODENOSCOPY (EGD) WITH PROPOFOL;  Surgeon: Kathi Der, MD;  Location: [facility] ENDOS[facility];  Service: Gastroenterology;  Laterality: N/A;   HERNIA REPAIR     IR FLUORO GUIDED NEEDLE PLC ASPIRATION/INJECTION LOC  07/28/2021   LUMBAR LAMINECTOMY/DECOMPRESSION MICRODISCECTOMY N/A 08/07/2021   Procedure: THORACIC FOUR -  THORACIC FIVE LAMINECTOMY/DECOMPRESSION OF SPINAL CORD, DEBRIDEMENT OF ABSCESS, MICRODISCECTOMY, INTRAOPERATIVE ULTRASOUND;  Surgeon: Lisbeth Renshaw, MD;  Location: [facility] OR;  Service: Neurosurgery;  Laterality: N/A;   TEE WITHOUT CARDIOVERSION N/A 07/29/2021   Procedure: TRANSESOPHAGEAL ECHOCARDIOGRAM (TEE);  Surgeon: Jake Bathe, MD;  Location: [facility] ENDOSCOPY;  Service:  Cardiovascular;  Laterality: N/A;   TRIGGER FINGER RELEASE Right 10/25/2019   Procedure: RIGHT INDEX FINGER RELEASE TRIGGER FINGER/A-1 PULLEY;  Surgeon: Cindee Salt, MD;  Location: [facility];  Service: Orthopedics;  Laterality: Right;  IV REGIONAL FOREARM BLOCK     A IV Location/Drains/Wounds Patient Lines/Drains/Airways Status     Active Line/Drains/Airways     Name Placement date Placement time Site Days   Peripheral IV 06/30/23 20 G 1" Anterior;Left Forearm 06/30/23  1300  Forearm  less than 1   Hemodialysis Catheter Right Subclavian --  --  Subclavian  --            Intake/Output Last 24 hours No intake or output data in the 24 hours ending 06/30/23 1524  Labs/Imaging Results for orders placed or performed during the hospital encounter of 06/30/23 (from the past 48 hour(s))  Comprehensive metabolic panel     Status: Abnormal   Collection Time: 06/30/23 11:27 AM  Result Value Ref Range   Sodium 133 (L) 135 - 145 mmol/L   Potassium 4.8 3.5 - 5.1 mmol/L   Chloride 105 98 - 111 mmol/L   CO2 17 (L) 22 - 32 mmol/L   Glucose, Bld 249 (H) 70 - 99 mg/dL    Comment: Glucose reference range applies only to samples taken after fasting for at least 8 hours.   BUN 54 (H) 6 - 20 mg/dL   Creatinine, Ser 4.01 (H) 0.61 - 1.24 mg/dL   Calcium 8.2 (L) 8.9 - 10.3 mg/dL   Total Protein 6.5 6.5 - 8.1 g/dL   Albumin 3.4 (L) 3.5 - 5.0 g/dL   AST 16 15 - 41 U/L   ALT 13 0 - 44 U/L   Alkaline Phosphatase 78 38 - 126 U/L   Total Bilirubin 0.8 0.3 - 1.2 mg/dL   GFR, Estimated 18 (L) >60 mL/min    Comment: (NOTE) Calculated using the CKD-EPI Creatinine Equation (2021)    Anion gap 11 5 - 15    Comment: Performed at [facility], [address], [place], [place] [address]  CBC     Status: Abnormal   Collection Time: 06/30/23 12:44 PM  Result Value Ref Range   WBC 12.8 (H) 4.0 - 10.5 K/uL   RBC 3.53 (L) 4.22 - 5.81 MIL/uL   Hemoglobin 11.1 (L) 13.0 - 17.0 g/dL   HCT  36.6 (L) 44.0 - 52.0 %   MCV 91.5 80.0 - 100.0 fL   MCH 31.4 26.0 - 34.0 pg   MCHC 34.4 30.0 - 36.0 g/dL   RDW 34.7 42.5 - 95.6 %   Platelets 206 150 - 400 K/uL   nRBC 0.0 0.0 - 0.2 %    Comment: Performed at [facility], [address], [place], [place] [address]   DG Chest Portable 1 View  Result Date: 06/30/2023 CLINICAL DATA:  sob, covid positive EXAM: PORTABLE CHEST 1 VIEW COMPARISON:  February 2024 FINDINGS: Interval removal of tunneled hemodialysis catheter. The cardiomediastinal silhouette is within normal limits. No pleural effusion. No pneumothorax. No mass or consolidation. No acute osseous abnormality. IMPRESSION: No acute findings in the chest. Electronically Signed   By: Elijio Miles  El-Abd M.D.   On: 06/30/2023 13:57    Pending Labs Unresulted Labs (From admission, onward)    None       Vitals/Pain Today's Vitals   06/30/23 1021 06/30/23 1051 06/30/23 1150 06/30/23 1452  BP: (!) 163/94 (!) 158/81 (!) 175/99 (!) 179/88  Pulse: 95  92 71  Resp: (!) 25 19 13  (!) 22  Temp: 98.3 F (36.8 C)     TempSrc: Oral     SpO2: 100% 100% 100% 100%    Isolation Precautions No active isolations  Medications Medications  ondansetron (ZOFRAN) injection 4 mg (4 mg Intravenous Given 06/30/23 1308)  sodium chloride 0.9 % bolus 500 mL (500 mLs Intravenous New Bag/Given 06/30/23 1310)  acetaminophen (TYLENOL) tablet 650 mg (650 mg Oral Given 06/30/23 1308)  metoprolol tartrate (LOPRESSOR) tablet 75 mg (75 mg Oral Given 06/30/23 1308)    Mobility walks     Focused Assessments Fever, missed dialysis, Covid +    R Recommendations: See Admitting Provider Note  Report given to:   Additional Notes: Pt is AO, walky/talky, able to answer questions, vomited a couple times down here

## 2023-07-01 DIAGNOSIS — I160 Hypertensive urgency: Secondary | ICD-10-CM | POA: Diagnosis not present

## 2023-07-01 DIAGNOSIS — E1122 Type 2 diabetes mellitus with diabetic chronic kidney disease: Secondary | ICD-10-CM | POA: Diagnosis not present

## 2023-07-01 DIAGNOSIS — U071 COVID-19: Secondary | ICD-10-CM | POA: Diagnosis not present

## 2023-07-01 DIAGNOSIS — N186 End stage renal disease: Secondary | ICD-10-CM | POA: Diagnosis not present

## 2023-07-01 LAB — RENAL FUNCTION PANEL
Albumin: 3.1 g/dL — ABNORMAL LOW (ref 3.5–5.0)
Anion gap: 9 (ref 5–15)
BUN: 49 mg/dL — ABNORMAL HIGH (ref 6–20)
CO2: 22 mmol/L (ref 22–32)
Calcium: 8.5 mg/dL — ABNORMAL LOW (ref 8.9–10.3)
Chloride: 105 mmol/L (ref 98–111)
Creatinine, Ser: 3.93 mg/dL — ABNORMAL HIGH (ref 0.61–1.24)
GFR, Estimated: 18 mL/min — ABNORMAL LOW (ref >60)
Glucose, Bld: 226 mg/dL — ABNORMAL HIGH (ref 70–99)
Phosphorus: 3.5 mg/dL (ref 2.5–4.6)
Potassium: 4.5 mmol/L (ref 3.5–5.1)
Sodium: 136 mmol/L (ref 135–145)

## 2023-07-01 LAB — CBC
HCT: 30.5 % — ABNORMAL LOW (ref 39.0–52.0)
Hemoglobin: 10.3 g/dL — ABNORMAL LOW (ref 13.0–17.0)
MCH: 30.1 pg (ref 26.0–34.0)
MCHC: 33.8 g/dL (ref 30.0–36.0)
MCV: 89.2 fL (ref 80.0–100.0)
Platelets: 194 10*3/uL (ref 150–400)
RBC: 3.42 MIL/uL — ABNORMAL LOW (ref 4.22–5.81)
RDW: 12.4 % (ref 11.5–15.5)
WBC: 13 10*3/uL — ABNORMAL HIGH (ref 4.0–10.5)
nRBC: 0 % (ref 0.0–0.2)

## 2023-07-01 LAB — GLUCOSE, CAPILLARY
Glucose-Capillary: 221 mg/dL — ABNORMAL HIGH (ref 70–99)
Glucose-Capillary: 169 mg/dL — ABNORMAL HIGH (ref 70–99)
Glucose-Capillary: 139 mg/dL — ABNORMAL HIGH (ref 70–99)
Glucose-Capillary: 138 mg/dL — ABNORMAL HIGH (ref 70–99)

## 2023-07-01 MED ORDER — CHLORHEXIDINE GLUCONATE CLOTH 2 % EX PADS
6.0000 | MEDICATED_PAD | Freq: Every day | CUTANEOUS | Status: DC
  Administered 2023-07-01 – 2023-07-03 (×3): 6 via TOPICAL

## 2023-07-01 MED ORDER — METOCLOPRAMIDE HCL 5 MG/ML IJ SOLN
5.0000 mg | Freq: Three times a day (TID) | INTRAMUSCULAR | Status: DC
  Administered 2023-07-01 – 2023-07-04 (×8): 5 mg via INTRAVENOUS
  Filled 2023-07-01 (×8): qty 2

## 2023-07-01 MED ORDER — PROMETHAZINE HCL 25 MG RE SUPP: 25.0000 mg | Freq: Four times a day (QID) | RECTAL | Status: DC | PRN

## 2023-07-01 MED ORDER — LOPERAMIDE HCL 2 MG PO CAPS: 2.0000 mg | ORAL_CAPSULE | ORAL | Status: DC | PRN

## 2023-07-01 MED ORDER — SODIUM CHLORIDE 0.9 % IV SOLN
25.0000 mg | Freq: Four times a day (QID) | INTRAVENOUS | Status: DC | PRN
  Administered 2023-07-01: 25 mg via INTRAVENOUS
  Filled 2023-07-01: qty 1

## 2023-07-01 MED ORDER — SODIUM CHLORIDE 0.9 % IV SOLN
200.0000 mg | Freq: Once | INTRAVENOUS | Status: AC
  Administered 2023-07-01: 200 mg via INTRAVENOUS
  Filled 2023-07-01: qty 40

## 2023-07-01 MED ORDER — SODIUM CHLORIDE 0.9 % IV SOLN: INTRAVENOUS | Status: DC

## 2023-07-01 MED ORDER — PROMETHAZINE HCL 12.5 MG PO TABS: 25.0000 mg | ORAL_TABLET | Freq: Four times a day (QID) | ORAL | Status: DC | PRN

## 2023-07-01 MED ORDER — SODIUM CHLORIDE 0.9 % IV BOLUS
1000.0000 mL | Freq: Once | INTRAVENOUS | Status: AC
  Administered 2023-07-01: 1000 mL via INTRAVENOUS

## 2023-07-01 MED ORDER — SODIUM CHLORIDE 0.9 % IV SOLN
100.0000 mg | Freq: Every day | INTRAVENOUS | Status: AC
  Administered 2023-07-02 – 2023-07-03 (×2): 100 mg via INTRAVENOUS
  Filled 2023-07-01 (×2): qty 20

## 2023-07-01 NOTE — Progress Notes (Signed)
PD tx initation note:   Pre TX VS:   Pre TX weight:  unable to obtain  PD treatment initiated via aseptic technique. Consent signed and in chart. Patient is alert and oriented. No complaints of pain. No specimen collected. PD exit site clean, dry and intact. Gentamycin and new dressing applied. Bedside RN educated on PD machine and how to contact tech support when PD machine alarms.    07/01/23 2320  Peritoneal Catheter Right lower abdomen  No placement date or time found.   Catheter Location: Right lower abdomen  Site Assessment Clean, Dry, Intact  Drainage Description None  Catheter status Deaccessed  Dressing Gauze/Drain sponge  Dressing Status Clean, Dry, Intact  Dressing Intervention Removed  Cycler Setup  Total Number of Night Cycles 3  Night Fill Volume 2500  Dianeal Solution Dextrose 1.5% in 6000 mL Low Cal/Low Mag  Night Dwell Time per Cycle - Hour(s) 3  Night Dwell Time per Cycle - Minute(s) 0  Night Time Therapy - Minute(s) 43  Night Time Therapy - Hour(s) 10  Minimum Initial Drain Volume 0  Maximum Peritoneal Volume 3750  Night/Total Therapy Volume 7500  Day Exchange No  Hand-off documentation  Hand-off Given Given to shift RN/LPN  Report given to (Full Name) Meagon Duskin, RN  Hand-off Received Received from shift RN/LPN  Report received from (Full Name) Michaelene Song, RN

## 2023-07-01 NOTE — Progress Notes (Signed)
[facility] Progress Note  Subjective: UOP 500 yest abnd 350 cc today. 1 L IVF's since admit recorded.   Vitals:   06/30/23 2349 07/01/23 0300 07/01/23 0500 07/01/23 0800  BP: (!) 149/92 (!) 161/92  (!) 145/90  Pulse: 74 76  79  Resp: (!) 24 17  20   Temp: 98.6 F (37 C) 98.7 F (37.1 C)  99.4 F (37.4 C)  TempSrc: Oral Oral  Oral  SpO2: 96% 96%  95%  Weight:   77.4 kg     Exam: Gen alert, no distress No jvd or bruits Chest clear bilat to bases RRR no MRG Abd soft ntnd no mass or ascites +bs Ext no LE edema Neuro is alert, Ox 3 , nf    PD cath intact mid abd          Home meds include - albuterol, xanax, lipitor, gabapentin 200 bid, hydralazine 50 tid, insulin glargine/ lispro, metoclopramide 5mg  bid, metoprolol 75 bid, prn zofran, flomax, torsmide 40 every day, trazodone, prns         OP PD: [facility] CCPD   3 exchanges per day, goes on 9am and comes off around 9 pm    2800 cc fill volume, dwell time 3.5hrs    dry wt 83.5kg     - last Hb 10.0 on 8/07   - last mircera 30 mcg sq on 05/09/23   - last pth 223 on 8/07, no vdra     CXR - no acute disease   Assessment/ Plan: Gen'd weakness - related to acute COVID infection and vol depletion most likely.  Volume - still looks / feels dry. Will bolus 1 L NS and resume IVF's. 6 kg under dry wt today.  DC'd torsemide.  ESRD - on CAPD, manual daytime exchanges. Got PD overnight last night. Will cont nightly PD while here imitating his daytime CAPD schedule.  HTN - unable to keep down BP meds. Catapres patch until BP's under n/v under control. Also could add scheduled IV metoprolol q 6 hrs if needed.  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 Follow.  MBD ckd - CCa and phos are in range.  DM2 - per pmd    Jose Moselle MD  [facility] 07/01/2023, 11:53 AM  Recent Labs  Lab 06/30/23 1127 06/30/23 1244 07/01/23 0426  HGB  --  11.1* 10.3*  ALBUMIN 3.4*  --  3.1*  CALCIUM 8.2*  --  8.5*  PHOS  --   --  3.5  CREATININE  3.95*  --  3.93*  K 4.8  --  4.5   No results for input(s): "IRON", "TIBC", "FERRITIN" in the last 168 hours. Inpatient medications:  Chlorhexidine Gluconate Cloth  6 each Topical Daily   cloNIDine  0.1 mg Transdermal Weekly   gabapentin  200 mg Oral BID   gentamicin cream  1 Application Topical Daily   guaiFENesin  600 mg Oral BID   heparin  5,000 Units Subcutaneous Q8H   insulin aspart  0-6 Units Subcutaneous TID WC   insulin glargine-yfgn  8 Units Subcutaneous Daily   metoCLOPramide (REGLAN) injection  5 mg Intravenous TID AC   metoprolol tartrate  75 mg Oral Daily   sodium chloride flush  3 mL Intravenous Q12H   tamsulosin  0.4 mg Oral QPC supper   torsemide  20 mg Oral Daily   traZODone  100 mg Oral QHS    sodium chloride 75 mL/hr at 07/01/23 0800   dialysis solution 1.5% low-MG/low-CA  promethazine (PHENERGAN) injection (IM or IVPB) 25 mg (07/01/23 0933)   [START ON 07/02/2023] remdesivir 100 mg in sodium chloride 0.9 % 100 mL IVPB     acetaminophen **OR** acetaminophen, albuterol, ALPRAZolam, hydrALAZINE, loperamide, ondansetron **OR** ondansetron (ZOFRAN) IV, promethazine **OR** promethazine (PHENERGAN) injection (IM or IVPB) **OR** promethazine

## 2023-07-01 NOTE — Progress Notes (Signed)
   07/01/23 1300  Completion  Treatment Status Complete  Initial Drain Volume 2  Average Dwell Time-Hour(s) 1.5  Average Dwell Time-Min(s) 180  Average Drain Time 19  Total Therapy Volume 7501  Total Therapy Time-Hour(s) 10  Total Therapy Time-Min(s) 45  Effluent Appearance Amber  Procedure Comments  Tolerated treatment well? Yes   No c/o with PD over night tolerated well

## 2023-07-01 NOTE — Progress Notes (Signed)
   07/01/23 2330  Peritoneal Catheter Right lower abdomen  No placement date or time found.   Catheter Location: Right lower abdomen  Site Assessment Clean, Dry, Intact  Drainage Description None  Catheter status Accessed  Dressing Gauze/Drain sponge  Dressing Status Clean, Dry, Intact  Dressing Intervention New dressing  Completion  Treatment Status Started  Procedure Comments  Peritoneal Dialysis Comments tx started without complication  Hand-off documentation  Hand-off Given Given to shift RN/LPN  Report given to (Full Name) Michaelene Song, RN  Hand-off Received Received from shift RN/LPN  Report received from (Full Name) Fielding Mault

## 2023-07-01 NOTE — Progress Notes (Addendum)
PROGRESS NOTE        PATIENT DETAILS Name: Jose [name] Age: 47 y.o. Sex: male Date of Birth: Feb 22, 1976 Admit Date: 06/30/2023 Admitting Physician Clydie Braun, MD ZOX:[id number], Taylar, NP  Brief Summary: Patient is a 47 y.o.  male with history of ESRD on peritoneal dialysis, DM-2, gastroparesis-who presented with fever, nausea/vomiting-found to have COVID-19 infection and subsequently admitted to the hospitalist service.  Significant events: 9/5>> admit to [facility]  Significant studies: 9/5>> CXR: No PNA  Significant microbiology data: None  Procedures: None  Consults: Nephrology  Subjective: Multiple episodes of vomiting overnight-several episodes of diarrhea as well.  No recent antibiotic use.  No abdominal pain.  Objective: Vitals: Blood pressure (!) 145/90, pulse 79, temperature 99.4 F (37.4 C), temperature source Oral, resp. rate 20, weight 77.4 kg, SpO2 95%.   Exam: Gen Exam:Alert awake-not in any distress HEENT:atraumatic, normocephalic Chest: B/L clear to auscultation anteriorly CVS:S1S2 regular Abdomen:soft non tender, non distended Extremities:no edema Neurology: Non focal Skin: no rash  Pertinent Labs/Radiology:    Latest Ref Rng & Units 07/01/2023    4:26 AM 06/30/2023   12:44 PM 12/03/2022    1:13 AM  CBC  WBC 4.0 - 10.5 K/uL 13.0  12.8  14.9   Hemoglobin 13.0 - 17.0 g/dL 09.8  11.9  14.7   Hematocrit 39.0 - 52.0 % 30.5  32.3  28.4   Platelets 150 - 400 K/uL 194  206  193     Lab Results  Component Value Date   NA 136 07/01/2023   K 4.5 07/01/2023   CL 105 07/01/2023   CO2 22 07/01/2023      Assessment/Plan: COVID-19 infection-with mostly GI symptoms Low-grade fever this morning Continues to have persistent nausea/vomiting-some diarrhea overnight Benign abdominal exam Stopping molnupiravir-switch to Remdesivir x 3 days-given risk for severe disease As needed Imodium On room air-chest x-ray without any  pneumonia-lung exam stable this morning If no improvement-will send out stool studies  Dehydration  Possible gastroparesis flare Exacerbated by COVID-19 infection Benign exam Scheduled IV Reglan On IVF As needed Zofran/Phenergan Small portion meals  ESRD on peritoneal dialysis Nephrology following  Normocytic anemia Secondary to above Iron is deferred to nephrology service  DM-2 CBGs stable Semglee 8 units daily + SSI  Recent Labs    06/30/23 1809 06/30/23 2148 07/01/23 0758  GLUCAP 155* 174* 221*     HTN BP stable Clonidine/metoprolol/Demadex  BPH Flomax  BMI: Estimated body mass index is 23.8 kg/m as calculated from the following:   Height as of 05/12/23: 5\' 11"  (1.803 m).   Weight as of this encounter: 77.4 kg.   Code status:   Code Status: Full Code   DVT Prophylaxis: heparin injection 5,000 Units Start: 06/30/23 1600   Family Communication: None at bedside   Disposition Plan: Status is: Observation The patient will require care spanning > 2 midnights and should be moved to inpatient because: Severity of illness   Planned Discharge Destination:Home   Diet: Diet Order             Diet renal/carb modified with fluid restriction Diet-HS Snack? Nothing; Fluid restriction: 1200 mL Fluid; Room service appropriate? Yes; Fluid consistency: Thin  Diet effective now                     Antimicrobial agents: Anti-infectives (From admission,  onward)    Start     Dose/Rate Route Frequency Ordered Stop   07/02/23 1000  remdesivir 100 mg in sodium chloride 0.9 % 100 mL IVPB       Placed in "Followed by" Linked Group   100 mg 200 mL/hr over 30 Minutes Intravenous Daily 07/01/23 0931 07/04/23 0959   07/01/23 1030  remdesivir 200 mg in sodium chloride 0.9% 250 mL IVPB       Placed in "Followed by" Linked Group   200 mg 580 mL/hr over 30 Minutes Intravenous Once 07/01/23 0931     06/30/23 2200  molnupiravir EUA (LAGEVRIO) capsule 800 mg  Status:   Discontinued       Note to Pharmacy: Stored in Main pharmacy   4 capsule Oral Every 12 hours 06/30/23 1921 07/01/23 0854        MEDICATIONS: Scheduled Meds:  Chlorhexidine Gluconate Cloth  6 each Topical Daily   cloNIDine  0.1 mg Transdermal Weekly   gabapentin  200 mg Oral BID   gentamicin cream  1 Application Topical Daily   guaiFENesin  600 mg Oral BID   heparin  5,000 Units Subcutaneous Q8H   insulin aspart  0-6 Units Subcutaneous TID WC   insulin glargine-yfgn  8 Units Subcutaneous Daily   metoCLOPramide  5 mg Oral BID   metoprolol tartrate  75 mg Oral Daily   sodium chloride flush  3 mL Intravenous Q12H   tamsulosin  0.4 mg Oral QPC supper   torsemide  20 mg Oral Daily   traZODone  100 mg Oral QHS   Continuous Infusions:  sodium chloride 75 mL/hr at 07/01/23 0800   dialysis solution 1.5% low-MG/low-CA     promethazine (PHENERGAN) injection (IM or IVPB) 25 mg (07/01/23 0933)   remdesivir 200 mg in sodium chloride 0.9% 250 mL IVPB 200 mg (07/01/23 1020)   Followed by   Melene Muller ON 07/02/2023] remdesivir 100 mg in sodium chloride 0.9 % 100 mL IVPB     PRN Meds:.acetaminophen **OR** acetaminophen, albuterol, ALPRAZolam, hydrALAZINE, ondansetron **OR** ondansetron (ZOFRAN) IV, promethazine **OR** promethazine (PHENERGAN) injection (IM or IVPB) **OR** promethazine   I have personally reviewed following labs and imaging studies  LABORATORY DATA: CBC: Recent Labs  Lab 06/30/23 1244 07/01/23 0426  WBC 12.8* 13.0*  HGB 11.1* 10.3*  HCT 32.3* 30.5*  MCV 91.5 89.2  PLT 206 194    Basic Metabolic Panel: Recent Labs  Lab 06/30/23 1127 07/01/23 0426  NA 133* 136  K 4.8 4.5  CL 105 105  CO2 17* 22  GLUCOSE 249* 226*  BUN 54* 49*  CREATININE 3.95* 3.93*  CALCIUM 8.2* 8.5*  PHOS  --  3.5    GFR: Estimated Creatinine Clearance: 24.2 mL/min (A) (by C-G formula based on SCr of 3.93 mg/dL (H)).  Liver Function Tests: Recent Labs  Lab 06/30/23 1127 07/01/23 0426   AST 16  --   ALT 13  --   ALKPHOS 78  --   BILITOT 0.8  --   PROT 6.5  --   ALBUMIN 3.4* 3.1*   No results for input(s): "LIPASE", "AMYLASE" in the last 168 hours. No results for input(s): "AMMONIA" in the last 168 hours.  Coagulation Profile: No results for input(s): "INR", "PROTIME" in the last 168 hours.  Cardiac Enzymes: No results for input(s): "CKTOTAL", "CKMB", "CKMBINDEX", "TROPONINI" in the last 168 hours.  BNP (last 3 results) No results for input(s): "PROBNP" in the last 8760 hours.  Lipid Profile: No results  for input(s): "CHOL", "HDL", "LDLCALC", "TRIG", "CHOLHDL", "LDLDIRECT" in the last 72 hours.  Thyroid Function Tests: No results for input(s): "TSH", "T4TOTAL", "FREET4", "T3FREE", "THYROIDAB" in the last 72 hours.  Anemia Panel: No results for input(s): "VITAMINB12", "FOLATE", "FERRITIN", "TIBC", "IRON", "RETICCTPCT" in the last 72 hours.  Urine analysis:    Component Value Date/Time   COLORURINE YELLOW 08/19/2022 2010   APPEARANCEUR CLEAR 08/19/2022 2010   APPEARANCEUR Clear 05/05/2018 1211   LABSPEC 1.017 08/19/2022 2010   PHURINE 5.0 08/19/2022 2010   GLUCOSEU 150 (A) 08/19/2022 2010   HGBUR NEGATIVE 08/19/2022 2010   BILIRUBINUR NEGATIVE 08/19/2022 2010   BILIRUBINUR negative 09/12/2018 0919   BILIRUBINUR Negative 05/05/2018 1211   KETONESUR NEGATIVE 08/19/2022 2010   PROTEINUR >=300 (A) 08/19/2022 2010   UROBILINOGEN 1.0 09/12/2018 0919   NITRITE NEGATIVE 08/19/2022 2010   LEUKOCYTESUR NEGATIVE 08/19/2022 2010    Sepsis Labs: Lactic Acid, Venous    Component Value Date/Time   LATICACIDVEN 1.1 12/02/2022 1134    MICROBIOLOGY: No results found for this or any previous visit (from the past 240 hour(s)).  RADIOLOGY STUDIES/RESULTS: DG Chest Portable 1 View  Result Date: 06/30/2023 CLINICAL DATA:  sob, covid positive EXAM: PORTABLE CHEST 1 VIEW COMPARISON:  February 2024 FINDINGS: Interval removal of tunneled hemodialysis catheter. The  cardiomediastinal silhouette is within normal limits. No pleural effusion. No pneumothorax. No mass or consolidation. No acute osseous abnormality. IMPRESSION: No acute findings in the chest. Electronically Signed   By: Olive Bass M.D.   On: 06/30/2023 13:57     LOS: 0 days   Jeoffrey Massed, MD  [facility]    To contact the attending provider between 7A-7P or the covering provider during after hours 7P-7A, please log into the web site [email or web address] and access using universal [place] password for that web site. If you do not have the password, please call the hospital operator.  07/01/2023, 10:33 AM

## 2023-07-01 NOTE — TOC CM/SW Note (Signed)
Transition of Care [facility]) - Inpatient Brief Assessment   Patient Details  Name: Jose [name] MRN: [id number] Date of Birth: [birth year]  Transition of Care [facility]) CM/SW Contact:    Mearl Latin, LCSW Phone Number: 07/01/2023, 3:45 PM   Clinical Narrative: Patient admitted from home with spouse and peritoneal dialysis admitted with COVID. No current TOC needs identified but please place consult if needs arise.     Transition of Care Asessment: Insurance and Status: Insurance coverage has been reviewed Patient has primary care physician: Yes Home environment has been reviewed: From home Prior level of function:: Independent Prior/Current Home Services: No current home services Social Determinants of Health Reivew: SDOH reviewed no interventions necessary Readmission risk has been reviewed: Yes Transition of care needs: no transition of care needs at this time

## 2023-07-01 NOTE — Plan of Care (Signed)
  Problem: Education: Goal: Ability to describe self-care measures that may prevent or decrease complications (Diabetes Survival Skills Education) will improve Outcome: Progressing Goal: Individualized Educational Video(s) Outcome: Progressing   Problem: Coping: Goal: Ability to adjust to condition or change in health will improve Outcome: Progressing   Problem: Fluid Volume: Goal: Ability to maintain a balanced intake and output will improve Outcome: Progressing   Problem: Health Behavior/Discharge Planning: Goal: Ability to identify and utilize available resources and services will improve Outcome: Progressing Goal: Ability to manage health-related needs will improve Outcome: Progressing   Problem: Metabolic: Goal: Ability to maintain appropriate glucose levels will improve Outcome: Progressing   Problem: Nutritional: Goal: Maintenance of adequate nutrition will improve Outcome: Progressing Goal: Progress toward achieving an optimal weight will improve Outcome: Progressing   Problem: Skin Integrity: Goal: Risk for impaired skin integrity will decrease Outcome: Progressing   Problem: Tissue Perfusion: Goal: Adequacy of tissue perfusion will improve Outcome: Progressing   Problem: Education: Goal: Knowledge of risk factors and measures for prevention of condition will improve Outcome: Progressing   Problem: Coping: Goal: Psychosocial and spiritual needs will be supported Outcome: Progressing   Problem: Respiratory: Goal: Will maintain a patent airway Outcome: Progressing Goal: Complications related to the disease process, condition or treatment will be avoided or minimized Outcome: Progressing   Problem: Education: Goal: Knowledge of General Education information will improve Description: Including pain rating scale, medication(s)/side effects and non-pharmacologic comfort measures Outcome: Progressing   Problem: Health Behavior/Discharge Planning: Goal:  Ability to manage health-related needs will improve Outcome: Progressing   Problem: Clinical Measurements: Goal: Ability to maintain clinical measurements within normal limits will improve Outcome: Progressing Goal: Will remain free from infection Outcome: Progressing Goal: Diagnostic test results will improve Outcome: Progressing Goal: Respiratory complications will improve Outcome: Progressing Goal: Cardiovascular complication will be avoided Outcome: Progressing   Problem: Activity: Goal: Risk for activity intolerance will decrease Outcome: Progressing   Problem: Nutrition: Goal: Adequate nutrition will be maintained Outcome: Progressing   Problem: Coping: Goal: Level of anxiety will decrease Outcome: Progressing   Problem: Elimination: Goal: Will not experience complications related to bowel motility Outcome: Progressing Goal: Will not experience complications related to urinary retention Outcome: Progressing   Problem: Pain Managment: Goal: General experience of comfort will improve Outcome: Progressing   Problem: Safety: Goal: Ability to remain free from injury will improve Outcome: Progressing   Problem: Skin Integrity: Goal: Risk for impaired skin integrity will decrease Outcome: Progressing   

## 2023-07-02 DIAGNOSIS — I160 Hypertensive urgency: Secondary | ICD-10-CM | POA: Diagnosis present

## 2023-07-02 DIAGNOSIS — Z87891 Personal history of nicotine dependence: Secondary | ICD-10-CM | POA: Diagnosis not present

## 2023-07-02 DIAGNOSIS — N186 End stage renal disease: Secondary | ICD-10-CM | POA: Diagnosis present

## 2023-07-02 DIAGNOSIS — N400 Enlarged prostate without lower urinary tract symptoms: Secondary | ICD-10-CM | POA: Diagnosis present

## 2023-07-02 DIAGNOSIS — Z91158 Patient's noncompliance with renal dialysis for other reason: Secondary | ICD-10-CM | POA: Diagnosis not present

## 2023-07-02 DIAGNOSIS — E1143 Type 2 diabetes mellitus with diabetic autonomic (poly)neuropathy: Secondary | ICD-10-CM | POA: Diagnosis present

## 2023-07-02 DIAGNOSIS — D631 Anemia in chronic kidney disease: Secondary | ICD-10-CM | POA: Diagnosis present

## 2023-07-02 DIAGNOSIS — Z91018 Allergy to other foods: Secondary | ICD-10-CM | POA: Diagnosis not present

## 2023-07-02 DIAGNOSIS — Z79899 Other long term (current) drug therapy: Secondary | ICD-10-CM | POA: Diagnosis not present

## 2023-07-02 DIAGNOSIS — E1122 Type 2 diabetes mellitus with diabetic chronic kidney disease: Secondary | ICD-10-CM | POA: Diagnosis present

## 2023-07-02 DIAGNOSIS — F411 Generalized anxiety disorder: Secondary | ICD-10-CM | POA: Diagnosis present

## 2023-07-02 DIAGNOSIS — Z888 Allergy status to other drugs, medicaments and biological substances status: Secondary | ICD-10-CM | POA: Diagnosis not present

## 2023-07-02 DIAGNOSIS — Z794 Long term (current) use of insulin: Secondary | ICD-10-CM | POA: Diagnosis not present

## 2023-07-02 DIAGNOSIS — E11319 Type 2 diabetes mellitus with unspecified diabetic retinopathy without macular edema: Secondary | ICD-10-CM | POA: Diagnosis present

## 2023-07-02 DIAGNOSIS — U071 COVID-19: Secondary | ICD-10-CM | POA: Diagnosis present

## 2023-07-02 DIAGNOSIS — R531 Weakness: Secondary | ICD-10-CM | POA: Diagnosis present

## 2023-07-02 DIAGNOSIS — K3184 Gastroparesis: Secondary | ICD-10-CM | POA: Diagnosis present

## 2023-07-02 DIAGNOSIS — E785 Hyperlipidemia, unspecified: Secondary | ICD-10-CM | POA: Diagnosis present

## 2023-07-02 DIAGNOSIS — E1165 Type 2 diabetes mellitus with hyperglycemia: Secondary | ICD-10-CM | POA: Diagnosis present

## 2023-07-02 DIAGNOSIS — I120 Hypertensive chronic kidney disease with stage 5 chronic kidney disease or end stage renal disease: Secondary | ICD-10-CM | POA: Diagnosis present

## 2023-07-02 DIAGNOSIS — A0839 Other viral enteritis: Secondary | ICD-10-CM | POA: Diagnosis present

## 2023-07-02 DIAGNOSIS — E860 Dehydration: Secondary | ICD-10-CM | POA: Diagnosis present

## 2023-07-02 DIAGNOSIS — Z992 Dependence on renal dialysis: Secondary | ICD-10-CM | POA: Diagnosis not present

## 2023-07-02 LAB — CBC WITH DIFFERENTIAL/PLATELET
Abs Immature Granulocytes: 0.03 10*3/uL (ref 0.00–0.07)
Basophils Absolute: 0 10*3/uL (ref 0.0–0.1)
Basophils Relative: 0 %
Eosinophils Absolute: 0 10*3/uL (ref 0.0–0.5)
Eosinophils Relative: 0 %
HCT: 27.7 % — ABNORMAL LOW (ref 39.0–52.0)
Hemoglobin: 9.5 g/dL — ABNORMAL LOW (ref 13.0–17.0)
Immature Granulocytes: 0 %
Lymphocytes Relative: 14 %
Lymphs Abs: 1.4 10*3/uL (ref 0.7–4.0)
MCH: 31.4 pg (ref 26.0–34.0)
MCHC: 34.3 g/dL (ref 30.0–36.0)
MCV: 91.4 fL (ref 80.0–100.0)
Monocytes Absolute: 1.2 10*3/uL — ABNORMAL HIGH (ref 0.1–1.0)
Monocytes Relative: 12 %
Neutro Abs: 7.2 10*3/uL (ref 1.7–7.7)
Neutrophils Relative %: 74 %
Platelets: 170 10*3/uL (ref 150–400)
RBC: 3.03 MIL/uL — ABNORMAL LOW (ref 4.22–5.81)
RDW: 12.2 % (ref 11.5–15.5)
WBC: 9.9 10*3/uL (ref 4.0–10.5)
nRBC: 0 % (ref 0.0–0.2)

## 2023-07-02 LAB — PROCALCITONIN: Procalcitonin: <0.1 ng/mL

## 2023-07-02 LAB — GLUCOSE, CAPILLARY
Glucose-Capillary: 145 mg/dL — ABNORMAL HIGH (ref 70–99)
Glucose-Capillary: 117 mg/dL — ABNORMAL HIGH (ref 70–99)
Glucose-Capillary: 114 mg/dL — ABNORMAL HIGH (ref 70–99)
Glucose-Capillary: 125 mg/dL — ABNORMAL HIGH (ref 70–99)

## 2023-07-02 LAB — BASIC METABOLIC PANEL
Anion gap: 5 (ref 5–15)
BUN: 41 mg/dL — ABNORMAL HIGH (ref 6–20)
CO2: 22 mmol/L (ref 22–32)
Calcium: 7.4 mg/dL — ABNORMAL LOW (ref 8.9–10.3)
Chloride: 108 mmol/L (ref 98–111)
Creatinine, Ser: 3.12 mg/dL — ABNORMAL HIGH (ref 0.61–1.24)
GFR, Estimated: 24 mL/min — ABNORMAL LOW (ref >60)
Glucose, Bld: 150 mg/dL — ABNORMAL HIGH (ref 70–99)
Potassium: 3.9 mmol/L (ref 3.5–5.1)
Sodium: 135 mmol/L (ref 135–145)

## 2023-07-02 LAB — BRAIN NATRIURETIC PEPTIDE: B Natriuretic Peptide: 80.2 pg/mL (ref 0.0–100.0)

## 2023-07-02 LAB — MAGNESIUM: Magnesium: 1.7 mg/dL (ref 1.7–2.4)

## 2023-07-02 LAB — C-REACTIVE PROTEIN: CRP: 5.9 mg/dL — ABNORMAL HIGH (ref <1.0)

## 2023-07-02 MED ORDER — SODIUM CHLORIDE 0.9 % IV SOLN
1.0000 g | INTRAVENOUS | Status: DC
Start: 2023-07-02 — End: 2023-07-02

## 2023-07-02 MED ORDER — HYDRALAZINE HCL 20 MG/ML IJ SOLN: 10.0000 mg | Freq: Four times a day (QID) | INTRAMUSCULAR | Status: DC | PRN

## 2023-07-02 NOTE — Progress Notes (Signed)
PROGRESS NOTE        PATIENT DETAILS Name: Jose [name] Age: 47 y.o. Sex: male Date of Birth: 1976/10/02 Admit Date: 06/30/2023 Admitting Physician Clydie Braun, MD ONG:EXBMWUX, Taylar, NP  Brief Summary: Patient is a 47 y.o.  male with history of ESRD on peritoneal dialysis, DM-2, gastroparesis-who presented with fever, nausea/vomiting-found to have COVID-19 infection and subsequently admitted to the hospitalist service.  Significant events: 9/5>> admit to [facility]  Significant studies: 9/5>> CXR: No PNA  Significant microbiology data: None  Procedures: None  Consults: Nephrology  Subjective: Patient in bed, appears comfortable, denies any headache, no fever, no chest pain or pressure, no shortness of breath , no abdominal pain. No focal weakness.  Objective: Vitals: Blood pressure (!) 162/87, pulse 84, temperature 99.6 F (37.6 C), temperature source Oral, resp. rate 20, weight 77.2 kg, SpO2 97%.   Exam:  Awake Alert, No new F.N deficits, Normal affect [place].AT,PERRAL Supple Neck, No JVD,   Symmetrical Chest wall movement, Good air movement bilaterally, CTAB RRR,No Gallops, Rubs or new Murmurs,  +ve B.Sounds, Abd Soft, No tenderness,   No Cyanosis, Clubbing or edema    Assessment/Plan:  COVID-19 infection-with mostly GI symptoms - Dehydrated, on as needed Imodium and antinausea medication, GI symptoms improving, no hypoxia, on room air, mild dry cough.  Monitor with supportive care.  Remdesivir started upon admission, finishing course.  Dehydration, Possible gastroparesis flare - Exacerbated by COVID-19 infection, nephrology following, supportive care and IV fluids.  ESRD on peritoneal dialysis Nephrology following  Normocytic anemia Secondary to above Iron is deferred to nephrology service  HTN BP stable Clonidine/metoprolol/Demadex  BPH Flomax  DM-2 CBGs stable Semglee 8 units daily + SSI  Recent Labs    07/01/23 1621  07/01/23 2052 07/02/23 0958  GLUCAP 139* 138* 145*      Code status:   Code Status: Full Code   DVT Prophylaxis: heparin injection 5,000 Units Start: 06/30/23 1600   Family Communication: None at bedside   Disposition Plan: Status is: Observation The patient will require care spanning > 2 midnights and should be moved to inpatient because: Severity of illness   Planned Discharge Destination:Home   Diet: Diet Order             Diet renal/carb modified with fluid restriction Diet-HS Snack? Nothing; Fluid restriction: 1200 mL Fluid; Room service appropriate? Yes; Fluid consistency: Thin  Diet effective now                   MEDICATIONS: Scheduled Meds:  Chlorhexidine Gluconate Cloth  6 each Topical Daily   cloNIDine  0.1 mg Transdermal Weekly   gabapentin  200 mg Oral BID   gentamicin cream  1 Application Topical Daily   guaiFENesin  600 mg Oral BID   heparin  5,000 Units Subcutaneous Q8H   insulin aspart  0-6 Units Subcutaneous TID WC   insulin glargine-yfgn  8 Units Subcutaneous Daily   metoCLOPramide (REGLAN) injection  5 mg Intravenous TID AC   metoprolol tartrate  75 mg Oral Daily   sodium chloride flush  3 mL Intravenous Q12H   tamsulosin  0.4 mg Oral QPC supper   traZODone  100 mg Oral QHS   Continuous Infusions:  sodium chloride 75 mL/hr at 07/02/23 0624   dialysis solution 1.5% low-MG/low-CA     promethazine (PHENERGAN) injection (IM  or IVPB) Stopped (07/01/23 0948)   remdesivir 100 mg in sodium chloride 0.9 % 100 mL IVPB     PRN Meds:.acetaminophen **OR** acetaminophen, albuterol, ALPRAZolam, hydrALAZINE, loperamide, ondansetron **OR** ondansetron (ZOFRAN) IV, promethazine **OR** promethazine (PHENERGAN) injection (IM or IVPB) **OR** promethazine   I have personally reviewed following labs and imaging studies  LABORATORY DATA:  Recent Labs  Lab 06/30/23 1244 07/01/23 0426  WBC 12.8* 13.0*  HGB 11.1* 10.3*  HCT 32.3* 30.5*  PLT 206 194   MCV 91.5 89.2  MCH 31.4 30.1  MCHC 34.4 33.8  RDW 12.3 12.4    Recent Labs  Lab 06/30/23 1127 07/01/23 0426  NA 133* 136  K 4.8 4.5  CL 105 105  CO2 17* 22  ANIONGAP 11 9  GLUCOSE 249* 226*  BUN 54* 49*  CREATININE 3.95* 3.93*  AST 16  --   ALT 13  --   ALKPHOS 78  --   BILITOT 0.8  --   ALBUMIN 3.4* 3.1*  CALCIUM 8.2* 8.5*     RADIOLOGY STUDIES/RESULTS: DG Chest Portable 1 View  Result Date: 06/30/2023 CLINICAL DATA:  sob, covid positive EXAM: PORTABLE CHEST 1 VIEW COMPARISON:  February 2024 FINDINGS: Interval removal of tunneled hemodialysis catheter. The cardiomediastinal silhouette is within normal limits. No pleural effusion. No pneumothorax. No mass or consolidation. No acute osseous abnormality. IMPRESSION: No acute findings in the chest. Electronically Signed   By: Olive Bass M.D.   On: 06/30/2023 13:57     LOS: 0 days   Signature  -    Susa Raring M.D on 07/02/2023 at 10:26 AM   -  To page go to [email or web address]

## 2023-07-02 NOTE — Progress Notes (Addendum)
[facility] Progress Note   Subjective:    Seen and examined patient at bedside. He reports feeling mildly congested but slowly feeling better. Tolerated PD overnight. Appears he's under EDW but slowly gaining his weight back. Will use all 1.5% bags again for Pd tonight.  Objective Vitals:   07/02/23 0500 07/02/23 0745 07/02/23 1015 07/02/23 1535  BP:  139/79 (!) 154/86 (!) 184/87  Pulse:  87  73  Resp:  20 18 18   Temp:   98.9 F (37.2 C)   TempSrc:      SpO2:  94% 96%   Weight: 77.2 kg  (S) 82.6 kg    Physical Exam Gen alert, no distress No jvd or bruits Chest clear bilat to bases RRR no MRG Abd soft ntnd no mass or ascites +bs Ext no LE edema Neuro is alert, Ox 3 , nf PD cath intact mid abd  Filed Weights   07/01/23 0500 07/02/23 0500 07/02/23 1015  Weight: 77.4 kg 77.2 kg (S) 82.6 kg    Intake/Output Summary (Last 24 hours) at 07/02/2023 1721 Last data filed at 07/02/2023 1534 Gross per 24 hour  Intake 715 ml  Output 200 ml  Net 515 ml    Additional Objective Labs: Basic Metabolic Panel: Recent Labs  Lab 06/30/23 1127 07/01/23 0426 07/02/23 1050  NA 133* 136 135  K 4.8 4.5 3.9  CL 105 105 108  CO2 17* 22 22  GLUCOSE 249* 226* 150*  BUN 54* 49* 41*  CREATININE 3.95* 3.93* 3.12*  CALCIUM 8.2* 8.5* 7.4*  PHOS  --  3.5  --    Liver Function Tests: Recent Labs  Lab 06/30/23 1127 07/01/23 0426  AST 16  --   ALT 13  --   ALKPHOS 78  --   BILITOT 0.8  --   PROT 6.5  --   ALBUMIN 3.4* 3.1*   No results for input(s): "LIPASE", "AMYLASE" in the last 168 hours. CBC: Recent Labs  Lab 06/30/23 1244 07/01/23 0426 07/02/23 1050  WBC 12.8* 13.0* 9.9  NEUTROABS  --   --  7.2  HGB 11.1* 10.3* 9.5*  HCT 32.3* 30.5* 27.7*  MCV 91.5 89.2 91.4  PLT 206 194 170   Blood Culture    Component Value Date/Time   SDES BLOOD LEFT ANTECUBITAL 12/02/2022 0955   SPECREQUEST  12/02/2022 0955    BOTTLES DRAWN AEROBIC AND ANAEROBIC Blood Culture  adequate volume   CULT  12/02/2022 0955    NO GROWTH 5 DAYS Performed at [facility], [address], [place], [place] [address]    REPTSTATUS 12/07/2022 FINAL 12/02/2022 0955    Cardiac Enzymes: No results for input(s): "CKTOTAL", "CKMB", "CKMBINDEX", "TROPONINI" in the last 168 hours. CBG: Recent Labs  Lab 07/01/23 1621 07/01/23 2052 07/02/23 0958 07/02/23 1304 07/02/23 1533  GLUCAP 139* 138* 145* 117* 114*   Iron Studies: No results for input(s): "IRON", "TIBC", "TRANSFERRIN", "FERRITIN" in the last 72 hours. Lab Results  Component Value Date   INR 1.2 08/19/2022   INR 1.1 06/27/2022   INR 1.1 06/02/2022   Studies/Results: No results found.  Medications:  dialysis solution 1.5% low-MG/low-CA     promethazine (PHENERGAN) injection (IM or IVPB) Stopped (07/01/23 0948)   remdesivir 100 mg in sodium chloride 0.9 % 100 mL IVPB 100 mg (07/02/23 1112)    Chlorhexidine Gluconate Cloth  6 each Topical Daily   cloNIDine  0.1 mg Transdermal Weekly   gabapentin  200 mg Oral BID  gentamicin cream  1 Application Topical Daily   guaiFENesin  600 mg Oral BID   heparin  5,000 Units Subcutaneous Q8H   insulin aspart  0-6 Units Subcutaneous TID WC   insulin glargine-yfgn  8 Units Subcutaneous Daily   metoCLOPramide (REGLAN) injection  5 mg Intravenous TID AC   metoprolol tartrate  75 mg Oral Daily   sodium chloride flush  3 mL Intravenous Q12H   tamsulosin  0.4 mg Oral QPC supper   traZODone  100 mg Oral QHS    Dialysis Orders: [facility] CCPD   3 exchanges per day, goes on 9am and comes off around 9 pm    2800 cc fill volume, dwell time 3.5hrs    dry wt 83.5kg     - last Hb 10.0 on 8/07   - last mircera 30 mcg sq on 05/09/23   - last pth 223 on 8/07, no vdra  Home meds include - albuterol, xanax, lipitor, gabapentin 200 bid, hydralazine 50 tid, insulin glargine/ lispro, metoclopramide 5mg  bid, metoprolol 75 bid, prn zofran, flomax, torsmide 40 every day, trazodone, prns    CXR - no acute disease   Assessment/Plan: Gen'd weakness - related to acute COVID infection and vol depletion most likely.  Volume - looked dry so we gave bolus 1 L NS and more IVF's and DC'd torsemide. Wt's are up , 1kg under today. IVF"s were dc'd. Cont 1.5% fluids for now w/ PD.   ESRD - on CAPD, manual daytime exchanges. Got PD overnight last night. Will cont nightly PD while here imitating his daytime CAPD schedule.  HTN - due to N/V using Catapres patch. Home metoprolol 75 bid is back on, holding hydralazine still. Seems to be tolerating some pills now.  Anemia esrd - Hbg 9.5, follow trend for now and will start an ESA if indicated.  MBD ckd - CCa and phos are in range.  DM2 - per pmd  Salome Holmes, NP [facility] 07/02/2023,5:21 PM  LOS: 0 days   Pt seen, examined and agree w A/P as above.  Vinson Moselle MD  [facility] 07/02/2023, 5:48 PM

## 2023-07-02 NOTE — Progress Notes (Signed)
   07/02/23 1015  Peritoneal Catheter Right lower abdomen  No placement date or time found.   Catheter Location: Right lower abdomen  Site Assessment Clean, Dry, Intact  Drainage Description None  Catheter status Deaccessed  Dressing Gauze/Drain sponge  Dressing Status Clean, Dry, Intact  Completion  Treatment Status Complete  Initial Drain Volume 117  Average Dwell Time-Hour(s) 1.5  Average Dwell Time-Min(s) 180  Average Drain Time 18  Total Therapy Volume 7500  Total Therapy Time-Hour(s) 10  Total Therapy Time-Min(s) 33  Weight after Drain 182 lb 1.6 oz (82.6 kg)  Effluent Appearance Yellow;Amber  Cell Count on Daytime Exchange N/A  Fluid Balance - CCPD  Total UF (- value on cycler, pt gain) -618 mL  Procedure Comments  Tolerated treatment well? Yes  Peritoneal Dialysis Comments Tx. disconnected without difficulties  Hand-off documentation  Hand-off Given Given to shift RN/LPN  Report given to (Full Name) Horris Latino RN  Hand-off Received Received from shift RN/LPN  Report received from (Full Name) Delrae Sawyers RN   PD post treatment note  PD treatment completed. Patient tolerated treatment well. PD effluent is clear. No specimen collected.  PD exit site clean, dry and intact. Patient is awake, oriented and in no acute distress.  Report given to bedside nurse.   Post treatment VS: 98.9, 18.154/86, 88, O2 96 R/A  Total UF removed: -618  Post treatment weight: 82.6 KG     No data to display

## 2023-07-02 NOTE — Plan of Care (Signed)
  Problem: Education: Goal: Ability to describe self-care measures that may prevent or decrease complications (Diabetes Survival Skills Education) will improve Outcome: Progressing Goal: Individualized Educational Video(s) Outcome: Progressing   Problem: Coping: Goal: Ability to adjust to condition or change in health will improve Outcome: Progressing   Problem: Fluid Volume: Goal: Ability to maintain a balanced intake and output will improve Outcome: Progressing   Problem: Health Behavior/Discharge Planning: Goal: Ability to identify and utilize available resources and services will improve Outcome: Progressing Goal: Ability to manage health-related needs will improve Outcome: Progressing   Problem: Metabolic: Goal: Ability to maintain appropriate glucose levels will improve Outcome: Progressing   Problem: Nutritional: Goal: Maintenance of adequate nutrition will improve Outcome: Progressing Goal: Progress toward achieving an optimal weight will improve Outcome: Progressing   Problem: Skin Integrity: Goal: Risk for impaired skin integrity will decrease Outcome: Progressing   Problem: Tissue Perfusion: Goal: Adequacy of tissue perfusion will improve Outcome: Progressing   Problem: Education: Goal: Knowledge of risk factors and measures for prevention of condition will improve Outcome: Progressing   Problem: Coping: Goal: Psychosocial and spiritual needs will be supported Outcome: Progressing   Problem: Respiratory: Goal: Will maintain a patent airway Outcome: Progressing Goal: Complications related to the disease process, condition or treatment will be avoided or minimized Outcome: Progressing   Problem: Education: Goal: Knowledge of General Education information will improve Description: Including pain rating scale, medication(s)/side effects and non-pharmacologic comfort measures Outcome: Progressing   Problem: Health Behavior/Discharge Planning: Goal:  Ability to manage health-related needs will improve Outcome: Progressing   Problem: Clinical Measurements: Goal: Ability to maintain clinical measurements within normal limits will improve Outcome: Progressing Goal: Will remain free from infection Outcome: Progressing Goal: Diagnostic test results will improve Outcome: Progressing Goal: Respiratory complications will improve Outcome: Progressing Goal: Cardiovascular complication will be avoided Outcome: Progressing   Problem: Activity: Goal: Risk for activity intolerance will decrease Outcome: Progressing   Problem: Nutrition: Goal: Adequate nutrition will be maintained Outcome: Progressing   Problem: Coping: Goal: Level of anxiety will decrease Outcome: Progressing   Problem: Elimination: Goal: Will not experience complications related to bowel motility Outcome: Progressing Goal: Will not experience complications related to urinary retention Outcome: Progressing   Problem: Pain Managment: Goal: General experience of comfort will improve Outcome: Progressing   Problem: Safety: Goal: Ability to remain free from injury will improve Outcome: Progressing   Problem: Skin Integrity: Goal: Risk for impaired skin integrity will decrease Outcome: Progressing   

## 2023-07-02 NOTE — Plan of Care (Signed)
  Problem: Education: Goal: Ability to describe self-care measures that may prevent or decrease complications (Diabetes Survival Skills Education) will improve Outcome: Progressing   Problem: Coping: Goal: Ability to adjust to condition or change in health will improve Outcome: Progressing   Problem: Fluid Volume: Goal: Ability to maintain a balanced intake and output will improve Outcome: Progressing   Problem: Metabolic: Goal: Ability to maintain appropriate glucose levels will improve Outcome: Progressing   Problem: Nutritional: Goal: Maintenance of adequate nutrition will improve Outcome: Progressing Goal: Progress toward achieving an optimal weight will improve Outcome: Progressing   Problem: Tissue Perfusion: Goal: Adequacy of tissue perfusion will improve Outcome: Progressing   Problem: Education: Goal: Knowledge of risk factors and measures for prevention of condition will improve Outcome: Progressing   Problem: Coping: Goal: Psychosocial and spiritual needs will be supported Outcome: Progressing   Problem: Education: Goal: Knowledge of General Education information will improve Description: Including pain rating scale, medication(s)/side effects and non-pharmacologic comfort measures Outcome: Progressing

## 2023-07-02 NOTE — Progress Notes (Signed)
Physical Therapy Evaluation Patient Details Name: Jose [name] MRN: [id number] DOB: May 02, 1976 Today's Date: 07/02/2023  History of Present Illness  47 yo male with onset of fever and malaise has been admitted 9/5 for management of chills, SOB and cough, nausea, vomiting, body aches and weakness.  Has Covid dx.  PMHx: peritoneal manual dialysis, anemia, ESRD, DM, HTN, HLD, spinal osteomyelitis, PNA, macular degeneration, retinopathy, anxiety  Clinical Impression  Pt was seen for mobility on side of bed and noted BP was uncontrolled at 161/83 supine and sitting 173/158.  Deferred further mobility but pt is coughing and feeling bad.  Follow up to try to progress to standing and walking as tolerated, as pt was just independently moving.  Has family support for home and recommend HHPT for follow up to get him stronger and make his fall risk less.  Follow for plan of care and goals as are outlined below.        If plan is discharge home, recommend the following: A little help with walking and/or transfers;A little help with bathing/dressing/bathroom;Assistance with cooking/housework;Help with stairs or ramp for entrance;Assist for transportation   Can travel by private vehicle        Equipment Recommendations None recommended by PT  Recommendations for Other Services       Functional Status Assessment Patient has had a recent decline in their functional status and demonstrates the ability to make significant improvements in function in a reasonable and predictable amount of time.     Precautions / Restrictions Precautions Precautions: Fall Precaution Comments: monitor BP Restrictions Weight Bearing Restrictions: No      Mobility  Bed Mobility Overal bed mobility: Needs Assistance Bed Mobility: Supine to Sit, Sit to Supine     Supine to sit: Min assist Sit to supine: Min assist        Transfers                   General transfer comment: deferred over BP     Ambulation/Gait               General Gait Details: deferred  Stairs            Wheelchair Mobility     Tilt Bed    Modified Rankin (Stroke Patients Only)       Balance Overall balance assessment: Needs assistance Sitting-balance support: Feet supported Sitting balance-Leahy Scale: Good                                       Pertinent Vitals/Pain Pain Assessment Pain Assessment: No/denies pain    Home Living Family/patient expects to be discharged to:: Private residence Living Arrangements: Spouse/significant other Available Help at Discharge: Family Type of Home: House Home Access: Stairs to enter Entrance Stairs-Rails: None Entrance Stairs-Number of Steps: 3   Home Layout: One level Home Equipment: Cane - single point;Shower seat;Rollator (4 wheels);Wheelchair - manual Additional Comments: has not fallen in months, using no AD lately    Prior Function Prior Level of Function : Independent/Modified Independent;Driving             Mobility Comments: no AD recently       Extremity/Trunk Assessment   Upper Extremity Assessment Upper Extremity Assessment: Overall WFL for tasks assessed    Lower Extremity Assessment Lower Extremity Assessment: Generalized weakness    Cervical / Trunk Assessment Cervical / Trunk Assessment: Other  exceptions (hx osteomyelitis)  Communication   Communication Communication: No apparent difficulties  Cognition Arousal: Alert Behavior During Therapy: WFL for tasks assessed/performed Overall Cognitive Status: Within Functional Limits for tasks assessed                                          General Comments General comments (skin integrity, edema, etc.): pt was assisted to return to bed after getting high readings on BP with pt feeling a bit light headed, defer to next session    Exercises General Exercises - Lower Extremity Ankle Circles/Pumps: AROM, 10 reps Gluteal  Sets: AROM, 10 reps   Assessment/Plan    PT Assessment Patient needs continued PT services  PT Problem List Decreased strength;Decreased activity tolerance;Decreased balance;Decreased mobility;Cardiopulmonary status limiting activity       PT Treatment Interventions DME instruction;Gait training;Stair training;Functional mobility training;Therapeutic activities;Therapeutic exercise;Balance training;Neuromuscular re-education;Patient/family education    PT Goals (Current goals can be found in the Care Plan section)  Acute Rehab PT Goals Patient Stated Goal: to get home and feel better PT Goal Formulation: With patient Time For Goal Achievement: 07/16/23 Potential to Achieve Goals: Good    Frequency Min 1X/week     Co-evaluation               AM-PAC PT "6 Clicks" Mobility  Outcome Measure Help needed turning from your back to your side while in a flat bed without using bedrails?: None Help needed moving from lying on your back to sitting on the side of a flat bed without using bedrails?: A Little Help needed moving to and from a bed to a chair (including a wheelchair)?: A Little Help needed standing up from a chair using your arms (e.g., wheelchair or bedside chair)?: Total Help needed to walk in hospital room?: Total Help needed climbing 3-5 steps with a railing? : Total 6 Click Score: 13    End of Session   Activity Tolerance: Treatment limited secondary to medical complications (Comment) Patient left: in bed;with call bell/phone within reach;with bed alarm set Nurse Communication: Mobility status PT Visit Diagnosis: Other abnormalities of gait and mobility (R26.89);Difficulty in walking, not elsewhere classified (R26.2)    Time: 4098-1191 PT Time Calculation (min) (ACUTE ONLY): 18 min   Charges:   PT Evaluation $PT Eval Moderate Complexity: 1 Mod   PT General Charges $$ ACUTE PT VISIT: 1 Visit        Ivar Drape 07/02/2023, 3:25 PM  Samul Dada, PT  PhD Acute Rehab Dept. Number: [facility] [id number] and [facility] [phone]

## 2023-07-02 NOTE — Plan of Care (Signed)
  Problem: Education: Goal: Ability to describe self-care measures that may prevent or decrease complications (Diabetes Survival Skills Education) will improve Outcome: Progressing Goal: Individualized Educational Video(s) Outcome: Progressing   Problem: Health Behavior/Discharge Planning: Goal: Ability to identify and utilize available resources and services will improve Outcome: Progressing Goal: Ability to manage health-related needs will improve Outcome: Progressing   Problem: Coping: Goal: Ability to adjust to condition or change in health will improve Outcome: Progressing   Problem: Fluid Volume: Goal: Ability to maintain a balanced intake and output will improve Outcome: Progressing

## 2023-07-03 DIAGNOSIS — U071 COVID-19: Secondary | ICD-10-CM | POA: Diagnosis not present

## 2023-07-03 LAB — BASIC METABOLIC PANEL
Anion gap: 10 (ref 5–15)
BUN: 40 mg/dL — ABNORMAL HIGH (ref 6–20)
CO2: 22 mmol/L (ref 22–32)
Calcium: 7.8 mg/dL — ABNORMAL LOW (ref 8.9–10.3)
Chloride: 103 mmol/L (ref 98–111)
Creatinine, Ser: 3.31 mg/dL — ABNORMAL HIGH (ref 0.61–1.24)
GFR, Estimated: 22 mL/min — ABNORMAL LOW (ref >60)
Glucose, Bld: 129 mg/dL — ABNORMAL HIGH (ref 70–99)
Potassium: 3.7 mmol/L (ref 3.5–5.1)
Sodium: 135 mmol/L (ref 135–145)

## 2023-07-03 LAB — MAGNESIUM: Magnesium: 1.7 mg/dL (ref 1.7–2.4)

## 2023-07-03 LAB — CBC WITH DIFFERENTIAL/PLATELET
Abs Immature Granulocytes: 0.02 10*3/uL (ref 0.00–0.07)
Basophils Absolute: 0 10*3/uL (ref 0.0–0.1)
Basophils Relative: 0 %
Eosinophils Absolute: 0.1 10*3/uL (ref 0.0–0.5)
Eosinophils Relative: 1 %
HCT: 27 % — ABNORMAL LOW (ref 39.0–52.0)
Hemoglobin: 9.5 g/dL — ABNORMAL LOW (ref 13.0–17.0)
Immature Granulocytes: 0 %
Lymphocytes Relative: 25 %
Lymphs Abs: 1.9 10*3/uL (ref 0.7–4.0)
MCH: 31.7 pg (ref 26.0–34.0)
MCHC: 35.2 g/dL (ref 30.0–36.0)
MCV: 90 fL (ref 80.0–100.0)
Monocytes Absolute: 0.9 10*3/uL (ref 0.1–1.0)
Monocytes Relative: 12 %
Neutro Abs: 4.5 10*3/uL (ref 1.7–7.7)
Neutrophils Relative %: 62 %
Platelets: 174 10*3/uL (ref 150–400)
RBC: 3 MIL/uL — ABNORMAL LOW (ref 4.22–5.81)
RDW: 11.9 % (ref 11.5–15.5)
WBC: 7.5 10*3/uL (ref 4.0–10.5)
nRBC: 0 % (ref 0.0–0.2)

## 2023-07-03 LAB — BRAIN NATRIURETIC PEPTIDE: B Natriuretic Peptide: 61.1 pg/mL (ref 0.0–100.0)

## 2023-07-03 LAB — GLUCOSE, CAPILLARY
Glucose-Capillary: 110 mg/dL — ABNORMAL HIGH (ref 70–99)
Glucose-Capillary: 187 mg/dL — ABNORMAL HIGH (ref 70–99)
Glucose-Capillary: 181 mg/dL — ABNORMAL HIGH (ref 70–99)

## 2023-07-03 MED ORDER — HYDRALAZINE HCL 50 MG PO TABS
50.0000 mg | ORAL_TABLET | Freq: Three times a day (TID) | ORAL | Status: DC
  Administered 2023-07-03 – 2023-07-04 (×3): 50 mg via ORAL
  Filled 2023-07-03 (×3): qty 1

## 2023-07-03 MED ORDER — TORSEMIDE 20 MG PO TABS
20.0000 mg | ORAL_TABLET | Freq: Every day | ORAL | Status: DC
  Administered 2023-07-03: 20 mg via ORAL
  Filled 2023-07-03 (×2): qty 1

## 2023-07-03 MED ORDER — DELFLEX-LC/1.5% DEXTROSE 344 MOSM/L IP SOLN: INTRAPERITONEAL | Status: DC

## 2023-07-03 NOTE — Progress Notes (Signed)
PROGRESS NOTE        PATIENT DETAILS Name: Jose [name] Age: 47 y.o. Sex: male Date of Birth: 09/07/1976 Admit Date: 06/30/2023 Admitting Physician Clydie Braun, MD NWG:NFAOZHY, Taylar, NP  Brief Summary: Patient is a 47 y.o.  male with history of ESRD on peritoneal dialysis, DM-2, gastroparesis-who presented with fever, nausea/vomiting-found to have COVID-19 infection and subsequently admitted to the hospitalist service.  Significant events: 9/5>> admit to [facility]  Significant studies: 9/5>> CXR: No PNA  Significant microbiology data: None  Procedures: None  Consults: Nephrology  Subjective: Patient in bed, appears comfortable, denies any headache, no fever, no chest pain or pressure, no shortness of breath , no abdominal pain. No new focal weakness.   Objective: Vitals: Blood pressure (!) 164/89, pulse 73, temperature 98.2 F (36.8 C), temperature source Oral, resp. rate 19, weight (S) 82.6 kg, SpO2 93%.   Exam:  Awake Alert, No new F.N deficits, Normal affect [place].AT,PERRAL Supple Neck, No JVD,   Symmetrical Chest wall movement, Good air movement bilaterally, CTAB RRR,No Gallops, Rubs or new Murmurs,  +ve B.Sounds, Abd Soft, No tenderness,   No Cyanosis, Clubbing or edema    Assessment/Plan:  COVID-19 infection-with mostly GI symptoms - Dehydrated, on as needed Imodium and antinausea medication, GI symptoms improving, no hypoxia, on room air, mild dry cough.  Monitor with supportive care.  Remdesivir started upon admission, finishing course.  Dehydration, Possible gastroparesis flare - Exacerbated by COVID-19 infection, nephrology following, supportive care and IV fluids.  Diuretic being held by nephrology.  ESRD on peritoneal dialysis Nephrology following  Normocytic anemia Secondary to above Iron is deferred to nephrology service  HTN BP stable Clonidine/metoprolol add as needed hydralazine  BPH Flomax  DM-2 CBGs  stable Semglee 8 units daily + SSI  Recent Labs    07/02/23 1533 07/02/23 2132 07/03/23 0748  GLUCAP 114* 125* 110*      Code status:   Code Status: Full Code   DVT Prophylaxis: heparin injection 5,000 Units Start: 06/30/23 1600   Family Communication: None at bedside   Disposition Plan: Status is: Observation The patient will require care spanning > 2 midnights and should be moved to inpatient because: Severity of illness   Planned Discharge Destination:Home   Diet: Diet Order             Diet renal/carb modified with fluid restriction Diet-HS Snack? Nothing; Fluid restriction: 1200 mL Fluid; Room service appropriate? Yes; Fluid consistency: Thin  Diet effective now                   MEDICATIONS: Scheduled Meds:  Chlorhexidine Gluconate Cloth  6 each Topical Daily   cloNIDine  0.1 mg Transdermal Weekly   gabapentin  200 mg Oral BID   gentamicin cream  1 Application Topical Daily   guaiFENesin  600 mg Oral BID   heparin  5,000 Units Subcutaneous Q8H   insulin aspart  0-6 Units Subcutaneous TID WC   insulin glargine-yfgn  8 Units Subcutaneous Daily   metoCLOPramide (REGLAN) injection  5 mg Intravenous TID AC   metoprolol tartrate  75 mg Oral Daily   sodium chloride flush  3 mL Intravenous Q12H   tamsulosin  0.4 mg Oral QPC supper   traZODone  100 mg Oral QHS   Continuous Infusions:  dialysis solution 1.5% low-MG/low-CA  promethazine (PHENERGAN) injection (IM or IVPB) Stopped (07/01/23 0948)   remdesivir 100 mg in sodium chloride 0.9 % 100 mL IVPB 100 mg (07/02/23 1112)   PRN Meds:.acetaminophen **OR** acetaminophen, albuterol, ALPRAZolam, hydrALAZINE, loperamide, ondansetron **OR** ondansetron (ZOFRAN) IV, promethazine **OR** promethazine (PHENERGAN) injection (IM or IVPB) **OR** promethazine   I have personally reviewed following labs and imaging studies  LABORATORY DATA:  Recent Labs  Lab 06/30/23 1244 07/01/23 0426 07/02/23 1050  07/03/23 0735  WBC 12.8* 13.0* 9.9 7.5  HGB 11.1* 10.3* 9.5* 9.5*  HCT 32.3* 30.5* 27.7* 27.0*  PLT 206 194 170 174  MCV 91.5 89.2 91.4 90.0  MCH 31.4 30.1 31.4 31.7  MCHC 34.4 33.8 34.3 35.2  RDW 12.3 12.4 12.2 11.9  LYMPHSABS  --   --  1.4 1.9  MONOABS  --   --  1.2* 0.9  EOSABS  --   --  0.0 0.1  BASOSABS  --   --  0.0 0.0    Recent Labs  Lab 06/30/23 1127 07/01/23 0426 07/02/23 1050 07/03/23 0735  NA 133* 136 135 135  K 4.8 4.5 3.9 3.7  CL 105 105 108 103  CO2 17* 22 22 22   ANIONGAP 11 9 5 10   GLUCOSE 249* 226* 150* 129*  BUN 54* 49* 41* 40*  CREATININE 3.95* 3.93* 3.12* 3.31*  AST 16  --   --   --   ALT 13  --   --   --   ALKPHOS 78  --   --   --   BILITOT 0.8  --   --   --   ALBUMIN 3.4* 3.1*  --   --   CRP  --   --  5.9*  --   PROCALCITON  --   --  <0.10  --   BNP  --   --  80.2 61.1  MG  --   --  1.7 1.7  CALCIUM 8.2* 8.5* 7.4* 7.8*     RADIOLOGY STUDIES/RESULTS: No results found.   LOS: 1 day   Signature  -    Susa Raring M.D on 07/03/2023 at 10:18 AM   -  To page go to [email or web address]

## 2023-07-03 NOTE — Progress Notes (Signed)
PD tx initation note:   Pre TX VS: 124/67  Pre TX weight: 83.1 kg  PD treatment initiated via aseptic technique. Consent signed and in chart. Patient is alert and oriented. No complaints of pain. No specimen collected. PD exit site clean, dry and intact. Gentamycin and new dressing applied. Bedside RN educated on PD machine and how to contact tech support when PD machine alarms.    07/03/23 1931  Peritoneal Catheter Right lower abdomen  No placement date or time found.   Catheter Location: Right lower abdomen  Site Assessment Clean, Dry, Intact  Drainage Description None  Catheter status Deaccessed  Dressing Gauze/Drain sponge  Dressing Status Clean, Dry, Intact  Dressing Intervention Removed  Cycler Setup  Total Number of Night Cycles 3  Night Fill Volume 2500  Dianeal Solution Dextrose 1.5% in 6000 mL Low Cal/Low Mag  Night Dwell Time per Cycle - Hour(s) 3  Night Dwell Time per Cycle - Minute(s) 0  Night Time Therapy - Minute(s) 44  Night Time Therapy - Hour(s) 10  Minimum Initial Drain Volume 0  Maximum Peritoneal Volume 3750  Night/Total Therapy Volume 7500  Day Exchange No  Hand-off documentation  Hand-off Given Given to shift RN/LPN  Report given to (Full Name) Fronnie Urton  Hand-off Received Received from shift RN/LPN  Report received from (Full Name) Kathrin Greathouse, RN

## 2023-07-03 NOTE — Progress Notes (Signed)
   07/03/23 1940  Peritoneal Catheter Right lower abdomen  No placement date or time found.   Catheter Location: Right lower abdomen  Site Assessment Clean, Dry, Intact  Drainage Description None  Catheter status Accessed  Dressing Gauze/Drain sponge  Dressing Status Clean, Dry, Intact  Dressing Intervention New dressing  Completion  Treatment Status Started  Hand-off documentation  Hand-off Given Given to shift RN/LPN  Report given to (Full Name) Kathrin Greathouse, RN  Hand-off Received Received from shift RN/LPN  Report received from (Full Name) Zaveon Gillen, RN

## 2023-07-03 NOTE — Plan of Care (Signed)
  Problem: Education: Goal: Ability to describe self-care measures that may prevent or decrease complications (Diabetes Survival Skills Education) will improve Outcome: Progressing Goal: Individualized Educational Video(s) Outcome: Progressing   Problem: Coping: Goal: Ability to adjust to condition or change in health will improve Outcome: Progressing   Problem: Fluid Volume: Goal: Ability to maintain a balanced intake and output will improve Outcome: Progressing   Problem: Health Behavior/Discharge Planning: Goal: Ability to identify and utilize available resources and services will improve Outcome: Progressing Goal: Ability to manage health-related needs will improve Outcome: Progressing   Problem: Metabolic: Goal: Ability to maintain appropriate glucose levels will improve Outcome: Progressing   Problem: Nutritional: Goal: Maintenance of adequate nutrition will improve Outcome: Progressing Goal: Progress toward achieving an optimal weight will improve Outcome: Progressing   Problem: Skin Integrity: Goal: Risk for impaired skin integrity will decrease Outcome: Progressing   Problem: Tissue Perfusion: Goal: Adequacy of tissue perfusion will improve Outcome: Progressing   Problem: Education: Goal: Knowledge of risk factors and measures for prevention of condition will improve Outcome: Progressing   Problem: Coping: Goal: Psychosocial and spiritual needs will be supported Outcome: Progressing   Problem: Respiratory: Goal: Will maintain a patent airway Outcome: Progressing Goal: Complications related to the disease process, condition or treatment will be avoided or minimized Outcome: Progressing   Problem: Education: Goal: Knowledge of General Education information will improve Description: Including pain rating scale, medication(s)/side effects and non-pharmacologic comfort measures Outcome: Progressing   Problem: Health Behavior/Discharge Planning: Goal:  Ability to manage health-related needs will improve Outcome: Progressing   Problem: Clinical Measurements: Goal: Ability to maintain clinical measurements within normal limits will improve Outcome: Progressing Goal: Will remain free from infection Outcome: Progressing Goal: Diagnostic test results will improve Outcome: Progressing Goal: Respiratory complications will improve Outcome: Progressing Goal: Cardiovascular complication will be avoided Outcome: Progressing   Problem: Activity: Goal: Risk for activity intolerance will decrease Outcome: Progressing   Problem: Nutrition: Goal: Adequate nutrition will be maintained Outcome: Progressing   Problem: Coping: Goal: Level of anxiety will decrease Outcome: Progressing   Problem: Elimination: Goal: Will not experience complications related to bowel motility Outcome: Progressing Goal: Will not experience complications related to urinary retention Outcome: Progressing   Problem: Pain Managment: Goal: General experience of comfort will improve Outcome: Progressing   Problem: Safety: Goal: Ability to remain free from injury will improve Outcome: Progressing   Problem: Skin Integrity: Goal: Risk for impaired skin integrity will decrease Outcome: Progressing   

## 2023-07-03 NOTE — Progress Notes (Signed)
PD post treatment note  PD treatment completed. Patient tolerated treatment well. PD effluent is clear. No specimen collected.  PD exit site clean, dry and intact. Patient is awake, oriented and in no acute distress.  Report given to bedside nurse.   Post treatment VS:   Total UF removed:  604 ml  Post treatment weight: 82.1 kg    07/03/23 0740  Peritoneal Catheter Right lower abdomen  No placement date or time found.   Catheter Location: Right lower abdomen  Site Assessment Clean, Dry, Intact  Drainage Description None  Catheter status Deaccessed  Dressing Gauze/Drain sponge  Dressing Status Clean, Dry, Intact  Dressing Intervention Dressing reinforced  Completion  Treatment Status Complete  Initial Drain Volume 979  Average Dwell Time-Hour(s) 3  Average Dwell Time-Min(s) 0  Average Drain Time 22  Total Therapy Volume 7501  Total Therapy Time-Hour(s) 11  Total Therapy Time-Min(s) 7  Weight after Drain 181 lb (82.1 kg)  Effluent Appearance Yellow;Amber  Cell Count on Daytime Exchange N/A  Fluid Balance - CCPD  Total UF (+ value on cycler, pt loss) 604 mL  Procedure Comments  Tolerated treatment well? Yes  Education / Care Plan  Dialysis Education Provided Yes  Hand-off documentation  Hand-off Given Given to shift RN/LPN  Hand-off Received Received from Transfer Unit/facility  Report received from (Full Name) Princess Bruins, RN

## 2023-07-03 NOTE — Plan of Care (Signed)
  Problem: Education: Goal: Ability to describe self-care measures that may prevent or decrease complications (Diabetes Survival Skills Education) will improve Outcome: Progressing   Problem: Fluid Volume: Goal: Ability to maintain a balanced intake and output will improve Outcome: Progressing   Problem: Health Behavior/Discharge Planning: Goal: Ability to identify and utilize available resources and services will improve Outcome: Progressing Goal: Ability to manage health-related needs will improve Outcome: Progressing   Problem: Nutritional: Goal: Maintenance of adequate nutrition will improve Outcome: Progressing Goal: Progress toward achieving an optimal weight will improve Outcome: Progressing   Problem: Skin Integrity: Goal: Risk for impaired skin integrity will decrease Outcome: Progressing   Problem: Coping: Goal: Psychosocial and spiritual needs will be supported Outcome: Progressing

## 2023-07-03 NOTE — Progress Notes (Addendum)
[name] Progress Note   Subjective:    Seen and examined patient at bedside. Eating solid foods today. Feeling better overall but still not back to normal.   Objective Vitals:   07/03/23 0336 07/03/23 0750 07/03/23 0800 07/03/23 1200  BP: (!) 153/83 (!) 150/85 (!) 164/89 (!) 147/80  Pulse: 74 72 73 68  Resp: 18 18 19 14   Temp: 98.8 F (37.1 C)  98.2 F (36.8 C) 97.8 F (36.6 C)  TempSrc: Oral  Oral Oral  SpO2: 95%  93% 95%  Weight:       Physical Exam Gen alert, no distress No jvd or bruits Chest clear bilat to bases RRR no MRG Abd soft ntnd no mass or ascites +bs Ext no LE edema Neuro is alert, Ox 3 , nf PD cath intact mid abd  Filed Weights   07/01/23 0500 07/02/23 0500 07/02/23 1015  Weight: 77.4 kg 77.2 kg (S) 82.6 kg    Additional Objective Labs: Basic Metabolic Panel: Recent Labs  Lab 07/01/23 0426 07/02/23 1050 07/03/23 0735  NA 136 135 135  K 4.5 3.9 3.7  CL 105 108 103  CO2 22 22 22   GLUCOSE 226* 150* 129*  BUN 49* 41* 40*  CREATININE 3.93* 3.12* 3.31*  CALCIUM 8.5* 7.4* 7.8*  PHOS 3.5  --   --     No results for input(s): "LIPASE", "AMYLASE" in the last 168 hours. CBC: Recent Labs  Lab 06/30/23 1244 07/01/23 0426 07/02/23 1050 07/03/23 0735  WBC 12.8* 13.0* 9.9 7.5  NEUTROABS  --   --  7.2 4.5  HGB 11.1* 10.3* 9.5* 9.5*  HCT 32.3* 30.5* 27.7* 27.0*  MCV 91.5 89.2 91.4 90.0  PLT 206 194 170 174     Medications:  dialysis solution 1.5% low-MG/low-CA     promethazine (PHENERGAN) injection (IM or IVPB) Stopped (07/01/23 0948)    Chlorhexidine Gluconate Cloth  6 each Topical Daily   cloNIDine  0.1 mg Transdermal Weekly   gabapentin  200 mg Oral BID   gentamicin cream  1 Application Topical Daily   guaiFENesin  600 mg Oral BID   heparin  5,000 Units Subcutaneous Q8H   insulin aspart  0-6 Units Subcutaneous TID WC   insulin glargine-yfgn  8 Units Subcutaneous Daily   metoCLOPramide (REGLAN) injection  5 mg  Intravenous TID AC   metoprolol tartrate  75 mg Oral Daily   sodium chloride flush  3 mL Intravenous Q12H   tamsulosin  0.4 mg Oral QPC supper   torsemide  20 mg Oral Daily   traZODone  100 mg Oral QHS    Dialysis Orders: [facility] CCPD   3 exchanges per day, goes on 9am and comes off around 9 pm    2800 cc fill volume, dwell time 3.5hrs    dry wt 83.5kg     - last Hb 10.0 on 8/07   - last mircera 30 mcg sq on 05/09/23   - last pth 223 on 8/07, no vdra  Home meds include - albuterol, xanax, lipitor, gabapentin 200 bid, hydralazine 50 tid, insulin glargine/ lispro, metoclopramide 5mg  bid, metoprolol 75 bid, prn zofran, flomax, torsmide 40 every day, trazodone, prns   CXR - no acute disease   Assessment/Plan: Gen'd weakness - related to acute COVID infection and vol depletion most likely. Improving slowly Volume - was dry looking better, under dry wt but closer now. Eating/ drinking some now. Will resume home torsemide 20 every day. Cont 1.5% fluids for  now w/ PD.   ESRD - on CAPD, manual daytime exchanges. Got PD overnight last night. Will cont nightly PD while here imitating his daytime CAPD schedule.  HTN - keeping pills down now. home metoprolol 75 bid resumed. On catapres patch still, added here when he was too nauseous to take pills. Will add home hydralazine and dc catapres patch today.  Anemia esrd - Hbg 9- 11 range. Follow trend for now and will start an ESA if indicated.  MBD ckd - CCa and phos are in range.  DM2 - per pmd  Jose Moselle  MD  [facility] 07/03/2023, 3:09 PM  Recent Labs  Lab 06/30/23 1127 06/30/23 1244 07/01/23 0426 07/02/23 1050 07/03/23 0735  HGB  --    < > 10.3* 9.5* 9.5*  ALBUMIN 3.4*  --  3.1*  --   --   CALCIUM 8.2*  --  8.5* 7.4* 7.8*  PHOS  --   --  3.5  --   --   CREATININE 3.95*  --  3.93* 3.12* 3.31*  K 4.8  --  4.5 3.9 3.7   < > = values in this interval not displayed.

## 2023-07-04 DIAGNOSIS — U071 COVID-19: Secondary | ICD-10-CM | POA: Diagnosis not present

## 2023-07-04 LAB — BASIC METABOLIC PANEL
Anion gap: 9 (ref 5–15)
BUN: 44 mg/dL — ABNORMAL HIGH (ref 6–20)
CO2: 22 mmol/L (ref 22–32)
Calcium: 7.7 mg/dL — ABNORMAL LOW (ref 8.9–10.3)
Chloride: 104 mmol/L (ref 98–111)
Creatinine, Ser: 3.49 mg/dL — ABNORMAL HIGH (ref 0.61–1.24)
GFR, Estimated: 21 mL/min — ABNORMAL LOW (ref >60)
Glucose, Bld: 155 mg/dL — ABNORMAL HIGH (ref 70–99)
Potassium: 3.5 mmol/L (ref 3.5–5.1)
Sodium: 135 mmol/L (ref 135–145)

## 2023-07-04 LAB — CBC WITH DIFFERENTIAL/PLATELET
Abs Immature Granulocytes: 0.03 10*3/uL (ref 0.00–0.07)
Basophils Absolute: 0 10*3/uL (ref 0.0–0.1)
Basophils Relative: 0 %
Eosinophils Absolute: 0.1 10*3/uL (ref 0.0–0.5)
Eosinophils Relative: 2 %
HCT: 27.8 % — ABNORMAL LOW (ref 39.0–52.0)
Hemoglobin: 9.9 g/dL — ABNORMAL LOW (ref 13.0–17.0)
Immature Granulocytes: 0 %
Lymphocytes Relative: 25 %
Lymphs Abs: 2 10*3/uL (ref 0.7–4.0)
MCH: 31.4 pg (ref 26.0–34.0)
MCHC: 35.6 g/dL (ref 30.0–36.0)
MCV: 88.3 fL (ref 80.0–100.0)
Monocytes Absolute: 1 10*3/uL (ref 0.1–1.0)
Monocytes Relative: 13 %
Neutro Abs: 4.7 10*3/uL (ref 1.7–7.7)
Neutrophils Relative %: 60 %
Platelets: 177 10*3/uL (ref 150–400)
RBC: 3.15 MIL/uL — ABNORMAL LOW (ref 4.22–5.81)
RDW: 12 % (ref 11.5–15.5)
WBC: 7.9 10*3/uL (ref 4.0–10.5)
nRBC: 0 % (ref 0.0–0.2)

## 2023-07-04 LAB — MAGNESIUM: Magnesium: 1.6 mg/dL — ABNORMAL LOW (ref 1.7–2.4)

## 2023-07-04 LAB — BRAIN NATRIURETIC PEPTIDE: B Natriuretic Peptide: 50 pg/mL (ref 0.0–100.0)

## 2023-07-04 LAB — GLUCOSE, CAPILLARY: Glucose-Capillary: 157 mg/dL — ABNORMAL HIGH (ref 70–99)

## 2023-07-04 MED ORDER — METOPROLOL TARTRATE 75 MG PO TABS: 75.0000 mg | ORAL_TABLET | Freq: Every day | ORAL | Status: AC

## 2023-07-04 MED ORDER — MAGNESIUM SULFATE 2 GM/50ML IV SOLN
2.0000 g | Freq: Once | INTRAVENOUS | Status: AC
  Administered 2023-07-04: 2 g via INTRAVENOUS
  Filled 2023-07-04: qty 50

## 2023-07-04 MED ORDER — POTASSIUM CHLORIDE CRYS ER 20 MEQ PO TBCR
20.0000 meq | EXTENDED_RELEASE_TABLET | Freq: Once | ORAL | Status: AC
  Administered 2023-07-04: 20 meq via ORAL
  Filled 2023-07-04: qty 1

## 2023-07-04 NOTE — TOC Transition Note (Signed)
Transition of Care [facility]) - CM/SW Discharge Note   Patient Details  Name: Jose [name] MRN: [id number] Date of Birth: 11-17-1975  Transition of Care [facility]) CM/SW Contact:  Tom-Johnson, Hershal Coria, RN Phone Number: 07/04/2023, 10:48 AM   Clinical Narrative:     Patient is scheduled for discharge today.  Readmission Risk Assessment done. Hospital f/u and discharge instructions on AVS. Home Health recommended, patient and wife declined.  No other TOC needs or recommendations noted. Wife, Denny Peon to transport at discharge.  No further TOC needs noted.         Final next level of care: Home/Self Care [facility]) Barriers to Discharge: Barriers Resolved   Patient Goals and CMS Choice CMS Medicare.gov Compare Post Acute Care list provided to:: Patient Choice offered to / list presented to : Patient, Spouse  Discharge Placement                  Patient to be transferred to facility by: Wife Name of family member notified: Erin    Discharge Plan and Services Additional resources added to the After Visit Summary for                  DME Arranged: N/A DME Agency: NA       HH Arranged: Refused HH HH Agency: NA        Social Determinants of Health (SDOH) Interventions SDOH Screenings   Food Insecurity: No Food Insecurity (07/01/2023)  Housing: Low Risk  (07/01/2023)  Transportation Needs: No Transportation Needs (07/01/2023)  Utilities: Not At Risk (07/01/2023)  Depression (PHQ2-9): Low Risk  (01/01/2022)  Financial Resource Strain: Low Risk  (11/25/2022)   Received [facility], [facility]  Physical Activity: Insufficiently Active (05/05/2018)  Social Connections: Unknown (11/10/2022)   Received from [facility], [facility]  Stress: No Stress Concern Present (12/15/2022)   Received [facility], [facility]  Tobacco Use: Medium Risk (05/12/2023)     Readmission Risk Interventions    07/04/2023   10:44 AM 07/30/2021    2:39 PM   Readmission Risk Prevention Plan  Transportation Screening Complete Complete  PCP or Specialist Appt within 3-5 Days Complete Complete  HRI or Home Care Consult Complete Complete  Social Work Consult for Recovery Care Planning/Counseling Complete Complete  Palliative Care Screening Not Applicable Complete  Medication Review Oceanographer) Referral to Pharmacy Complete

## 2023-07-04 NOTE — Discharge Summary (Signed)
Jose [name] RUE:[id number] DOB: 1976-09-23 DOA: 06/30/2023  PCP: Inez Pilgrim, NP  Admit date: 06/30/2023  Discharge date: 07/04/2023  Admitted From: Home   Disposition:  Home   Recommendations for Outpatient Follow-up:   Follow up with PCP in 1-2 weeks  PCP Please obtain BMP/CBC, 2 view CXR in 1week,  (see Discharge instructions)   PCP Please follow up on the following pending results: Monitor blood pressure, magnesium, BMP and CBGs closely.   Home Health: PT Equipment/Devices: None Consultations: Nephrology Discharge Condition: Stable    CODE STATUS: Full    Diet Recommendation: Renal-low carbohydrate diet, 1.2 L fluid restriction per day.    Chief Complaint  Patient presents with   Fever    Covid + tested yesterday symptoms presenting 2 days ago with fever N/V/DFatigue Vomited 4x since this @ 4am. Missed Dialysis since Tuesday does home dialysis 3x daily.  Stable VS by EMS BP 124/76 96 HR 97 on RA  213 BS Diabetes     Brief history of present illness from the day of admission and additional interim summary    47 y.o.  male with history of ESRD on peritoneal dialysis, DM-2, gastroparesis-who presented with fever, nausea/vomiting-found to have COVID-19 infection and subsequently admitted to the hospitalist service.   Significant events: 9/5>> admit to [facility]   Significant studies: 9/5>> CXR: No PNA   Significant microbiology data: None   Procedures: None   Consults: Nephrology                                                                   Hospital Course   COVID-19 infection-with mostly GI symptoms - Dehydrated, on as needed Imodium and antinausea medication, GI symptoms resolved he is back to baseline, no shortness of breath, minimal dry cough.   Dehydration, Possible gastroparesis  flare - Exacerbated by COVID-19 infection, and by nephrology Demadex was held he was gently hydrated with IV fluids, back to baseline now blood pressure is much improved, symptom-free and eager to go home.  Will be discharged home.  PCP and nephrology follow-up recommended postdischarge within the next 3 to 4 days.   ESRD on peritoneal dialysis Nephrology following and stable now.   Normocytic anemia Secondary to above Iron is deferred to nephrology service, patient is not taking it currently according to him.  PCP to monitor.   HTN BP stable Resume home medications in a graduated fashion, PCP to monitor   BPH Flomax   Hypomagnesemia.  Replaced.    DM-2 Continue home regimen requested to check CBGs q. [facility]   Discharge diagnosis     Principal Problem:   COVID-19 virus infection Active Problems:   Leukocytosis   ESRD (end stage renal disease) on dialysis [facility])   Hypertensive urgency   Type 2 diabetes  mellitus with chronic kidney disease, with long-term current use of insulin (HCC)   Anemia of chronic disease   Weakness    Discharge instructions    Discharge Instructions     Discharge instructions   Complete by: As directed    Continue home PD treatments as before unchanged.    Follow with Primary MD Inez Pilgrim, NP in 3 days   Get CBC, CMP, Magnesium -  checked next visit with your primary MD    Activity: As tolerated with Full fall precautions use walker/cane & assistance as needed  Disposition Home   Diet: Renal-low carbohydrate diet with 1.2 L fluid restriction per day, check CBGs q. [facility].  Special Instructions: If you have smoked or chewed Tobacco  in the last 2 yrs please stop smoking, stop any regular Alcohol  and or any Recreational drug use.  On your next visit with your primary care physician please Get Medicines reviewed and adjusted.  Please request your Prim.MD to go over all Hospital Tests and Procedure/Radiological results at the follow up,  please get all Hospital records sent to your Prim MD by signing hospital release before you go home.  If you experience worsening of your admission symptoms, develop shortness of breath, life threatening emergency, suicidal or homicidal thoughts you must seek medical attention immediately by calling 911 or calling your MD immediately  if symptoms less severe.  You Must read complete instructions/literature along with all the possible adverse reactions/side effects for all the Medicines you take and that have been prescribed to you. Take any new Medicines after you have completely understood and accpet all the possible adverse reactions/side effects.   Increase activity slowly   Complete by: As directed    MyChart COVID-19 home monitoring program   Complete by: Jul 04, 2023    Is the patient willing to use the MyChart Mobile App for home monitoring?: Yes   No wound care   Complete by: As directed    Temperature monitoring   Complete by: Jul 04, 2023    After how many days would you like to receive a notification of this patient's flowsheet entries?: 1       Discharge Medications   Allergies as of 07/04/2023       Reactions   Tirzepatide Rash   Cranberry Itching   Hm Lidocaine Patch [lidocaine] Dermatitis   Blisters skin    Melatonin Other (See Comments)   nightmares   Nsaids Other (See Comments)   Stage 3 kidney disease   Trulicity [dulaglutide] Other (See Comments)   Night sweats  Uncontrolled tempeture         Medication List     STOP taking these medications    iron polysaccharides 150 MG capsule Commonly known as: NIFEREX   Lagevrio 200 MG Caps capsule Generic drug: molnupiravir EUA       TAKE these medications    acetaminophen 500 MG tablet Commonly known as: TYLENOL Take 500-1,000 mg by mouth every 6 (six) hours as needed for mild pain.   albuterol 108 (90 Base) MCG/ACT inhaler Commonly known as: VENTOLIN HFA Inhale 1 puff into the lungs every 4 (four)  hours as needed for wheezing or shortness of breath.   ALPRAZolam 0.25 MG tablet Commonly known as: XANAX Take 0.25 mg by mouth 2 (two) times daily as needed for anxiety.   BD Pen Needle Nano 2nd Gen 32G X 4 MM Misc Generic drug: Insulin Pen Needle USE AS DIRECTED FOUR TIMES A  DAY.   cholecalciferol 25 MCG (1000 UNIT) tablet Commonly known as: VITAMIN D3 Take 1,000 Units by mouth daily.   diclofenac Sodium 1 % Gel Commonly known as: VOLTAREN Apply 1 Application topically 2 (two) times daily as needed (shoulder pain).   FreeStyle Libre 3 Sensor Misc 1 Device by Does not apply route every 14 (fourteen) days. Apply 1 sensor on upper arm every 14 days for continuous glucose monitoring   gabapentin 100 MG capsule Commonly known as: NEURONTIN Take 2 capsules (200 mg total) by mouth 2 (two) times daily.   HumuLIN 70/30 (70-30) 100 UNIT/ML injection Generic drug: insulin NPH-regular Human Inject 8 Units into the skin daily.   HumuLIN 70/30 KwikPen (70-30) 100 UNIT/ML KwikPen Generic drug: insulin isophane & regular human KwikPen Inject 8 Units into the skin daily.   insulin lispro 100 UNIT/ML KwikPen Commonly known as: HumaLOG KwikPen Inject 10 Units into the skin 3 (three) times daily. What changed:  how much to take when to take this reasons to take this   metoCLOPramide 5 MG tablet Commonly known as: REGLAN Take 5 mg by mouth in the morning and at bedtime.   Metoprolol Tartrate 75 MG Tabs Take 1 tablet (75 mg total) by mouth daily. Start taking on: July 05, 2023   ondansetron 24 MG tablet Commonly known as: ZOFRAN Take 24 mg by mouth daily as needed for nausea.   polyethylene glycol 17 g packet Commonly known as: MIRALAX / GLYCOLAX Take 17 g by mouth daily as needed for moderate constipation, mild constipation or severe constipation.   tamsulosin 0.4 MG Caps capsule Commonly known as: FLOMAX Take 1 capsule (0.4 mg total) by mouth daily after supper.    torsemide 20 MG tablet Commonly known as: DEMADEX Take 20 mg by mouth daily.   Toujeo SoloStar 300 UNIT/ML Solostar Pen Generic drug: insulin glargine (1 Unit Dial) Inject 20 Units into the skin daily. Adjust as directed What changed:  how much to take when to take this additional instructions   traZODone 100 MG tablet Commonly known as: DESYREL Take 100 mg by mouth at bedtime.         Follow-up Information     Pridgen, Stasia Cavalier, NP. Schedule an appointment as soon as possible for a visit in 3 day(s).   Specialty: Family Medicine Why: And with your nephrologist within 3 to 4 days. Contact information: [address] [place] [address] [phone]                 Major procedures and Radiology Reports - PLEASE review detailed and final reports thoroughly  -      DG Chest Portable 1 View  Result Date: 06/30/2023 CLINICAL DATA:  sob, covid positive EXAM: PORTABLE CHEST 1 VIEW COMPARISON:  February 2024 FINDINGS: Interval removal of tunneled hemodialysis catheter. The cardiomediastinal silhouette is within normal limits. No pleural effusion. No pneumothorax. No mass or consolidation. No acute osseous abnormality. IMPRESSION: No acute findings in the chest. Electronically Signed   By: Olive Bass M.D.   On: 06/30/2023 13:57    Micro Results    No results found for this or any previous visit (from the past 240 hour(s)).  Today   Subjective    Isao Granado today has no headache,no chest abdominal pain,no new weakness tingling or numbness, feels much better wants to go home today.    Objective   Blood pressure 111/64, pulse 80, temperature 98.3 F (36.8 C), temperature source Oral, resp. rate Marland Kitchen)  21, weight (S) 82.6 kg, SpO2 96%.   Intake/Output Summary (Last 24 hours) at 07/04/2023 0840 Last data filed at 07/03/2023 1700 Gross per 24 hour  Intake 360 ml  Output 750 ml  Net -390 ml    Exam  Awake Alert, No new F.N deficits,     [place].AT,PERRAL Supple Neck,   Symmetrical Chest wall movement, Good air movement bilaterally, CTAB RRR,No Gallops,   +ve B.Sounds, Abd Soft, Non tender,  No Cyanosis, Clubbing or edema    Data Review   Recent Labs  Lab 06/30/23 1244 07/01/23 0426 07/02/23 1050 07/03/23 0735 07/04/23 0528  WBC 12.8* 13.0* 9.9 7.5 7.9  HGB 11.1* 10.3* 9.5* 9.5* 9.9*  HCT 32.3* 30.5* 27.7* 27.0* 27.8*  PLT 206 194 170 174 177  MCV 91.5 89.2 91.4 90.0 88.3  MCH 31.4 30.1 31.4 31.7 31.4  MCHC 34.4 33.8 34.3 35.2 35.6  RDW 12.3 12.4 12.2 11.9 12.0  LYMPHSABS  --   --  1.4 1.9 2.0  MONOABS  --   --  1.2* 0.9 1.0  EOSABS  --   --  0.0 0.1 0.1  BASOSABS  --   --  0.0 0.0 0.0    Recent Labs  Lab 06/30/23 1127 07/01/23 0426 07/02/23 1050 07/03/23 0735 07/04/23 0528  NA 133* 136 135 135 135  K 4.8 4.5 3.9 3.7 3.5  CL 105 105 108 103 104  CO2 17* 22 22 22 22   ANIONGAP 11 9 5 10 9   GLUCOSE 249* 226* 150* 129* 155*  BUN 54* 49* 41* 40* 44*  CREATININE 3.95* 3.93* 3.12* 3.31* 3.49*  AST 16  --   --   --   --   ALT 13  --   --   --   --   ALKPHOS 78  --   --   --   --   BILITOT 0.8  --   --   --   --   ALBUMIN 3.4* 3.1*  --   --   --   CRP  --   --  5.9*  --   --   PROCALCITON  --   --  <0.10  --   --   BNP  --   --  80.2 61.1 50.0  MG  --   --  1.7 1.7 1.6*  CALCIUM 8.2* 8.5* 7.4* 7.8* 7.7*    Total Time in preparing paper work, data evaluation and todays exam - 35 minutes  Signature  -    Susa Raring M.D on 07/04/2023 at 8:40 AM   -  To page go to [email or web address]

## 2023-07-04 NOTE — Progress Notes (Signed)
Mobility Specialist Progress Note:   07/04/23 0920  Mobility  Activity Ambulated with assistance in room  Level of Assistance Contact guard assist, steadying assist  Assistive Device Other (Comment);None (IV Pole)  Distance Ambulated (ft) 30 ft  Activity Response Tolerated well  Mobility Referral Yes  $Mobility charge 1 Mobility  Mobility Specialist Start Time (ACUTE ONLY) 0920  Mobility Specialist Stop Time (ACUTE ONLY) 0930  Mobility Specialist Time Calculation (min) (ACUTE ONLY) 10 min   Pt agreeable to mobility session. Required minG during ambulation d/t minor unsteadiness. Pt back in bed with all needs met.   Addison Lank Mobility Specialist Please contact via SecureChat or  Rehab office at [phone]

## 2023-07-04 NOTE — Progress Notes (Signed)
Physical Therapy Treatment Patient Details Name: Jose [name] MRN: [id number] DOB: 1976/04/19 Today's Date: 07/04/2023   History of Present Illness 47 yo male with onset of fever and malaise has been admitted 9/5 for management of chills, SOB and cough, nausea, vomiting, body aches and weakness.  Has Covid dx.  PMHx: peritoneal manual dialysis, anemia, ESRD, DM, HTN, HLD, spinal osteomyelitis, PNA, macular degeneration, retinopathy, anxiety    PT Comments  Pt admitted with above diagnosis. Pt able to ambulate on unit and is at baseline per pt. Declines HHPT f/u and can verbalize to this PT his exercise program.  Pt doesn't need assistive device at this time. Pt to d/c home today.   Pt currently with functional limitations due to the deficits listed below (see PT Problem List). Pt will benefit from acute skilled PT to increase their independence and safety with mobility to allow discharge.       If plan is discharge home, recommend the following: A little help with walking and/or transfers;A little help with bathing/dressing/bathroom;Assistance with cooking/housework;Help with stairs or ramp for entrance;Assist for transportation   Can travel by private vehicle        Equipment Recommendations  None recommended by PT    Recommendations for Other Services       Precautions / Restrictions Precautions Precautions: Fall Restrictions Weight Bearing Restrictions: No     Mobility  Bed Mobility Overal bed mobility: Needs Assistance Bed Mobility: Supine to Sit, Sit to Supine     Supine to sit: Independent Sit to supine: Independent        Transfers Overall transfer level: Needs assistance Equipment used: None Transfers: Sit to/from Stand Sit to Stand: Independent           General transfer comment: No issues with sit to stand wtih good safety.    Ambulation/Gait Ambulation/Gait assistance: Supervision Gait Distance (Feet): 200 Feet Assistive device: None Gait  Pattern/deviations: Decreased dorsiflexion - left, Decreased weight shift to left, Knee hyperextension - left, Decreased stride length   Gait velocity interpretation: 1.31 - 2.62 ft/sec, indicative of limited community ambulator   General Gait Details: Pt ambulated incr distance today and did not need any physical assist.  Noted that pt does have decr dorsiflexion on left LE however pt does compensate for this and did not have LOB with gait today. Pt states that he was referred for an AFO however he didnt end up going to get it due to his job demands.  Pt is safe without device and shows caution.   Stairs             Wheelchair Mobility     Tilt Bed    Modified Rankin (Stroke Patients Only)       Balance Overall balance assessment: Needs assistance Sitting-balance support: Feet supported, No upper extremity supported Sitting balance-Leahy Scale: Good     Standing balance support: No upper extremity supported, During functional activity Standing balance-Leahy Scale: Good Standing balance comment: No LOB witih challenges                            Cognition Arousal: Alert Behavior During Therapy: WFL for tasks assessed/performed Overall Cognitive Status: Within Functional Limits for tasks assessed  Exercises General Exercises - Lower Extremity Ankle Circles/Pumps: AROM, 10 reps Long Arc Quad: AROM, Both, 10 reps, Seated Hip Flexion/Marching: AROM, Both, 10 reps, Seated    General Comments General comments (skin integrity, edema, etc.): VSS, discussed pts HEP and pt reports doing squats and has theraband for resistance exercises. Pt also does stretches.      Pertinent Vitals/Pain Pain Assessment Pain Assessment: No/denies pain    Home Living                          Prior Function            PT Goals (current goals can now be found in the care plan section) Acute Rehab PT  Goals Patient Stated Goal: to get home and feel better PT Goal Formulation: All assessment and education complete, DC therapy Progress towards PT goals: Goals met/education completed, patient discharged from PT    Frequency    Min 1X/week      PT Plan      Co-evaluation              AM-PAC PT "6 Clicks" Mobility   Outcome Measure  Help needed turning from your back to your side while in a flat bed without using bedrails?: None Help needed moving from lying on your back to sitting on the side of a flat bed without using bedrails?: None Help needed moving to and from a bed to a chair (including a wheelchair)?: None Help needed standing up from a chair using your arms (e.g., wheelchair or bedside chair)?: None Help needed to walk in hospital room?: A Little Help needed climbing 3-5 steps with a railing? : A Little 6 Click Score: 22    End of Session Equipment Utilized During Treatment: Gait belt Activity Tolerance: Patient tolerated treatment well Patient left: with call bell/phone within reach (sitting EOB) Nurse Communication: Mobility status PT Visit Diagnosis: Other abnormalities of gait and mobility (R26.89);Difficulty in walking, not elsewhere classified (R26.2)     Time: 8119-1478 PT Time Calculation (min) (ACUTE ONLY): 19 min  Charges:    $Gait Training: 8-22 mins PT General Charges $$ ACUTE PT VISIT: 1 Visit                     Mikenzie Mccannon M,PT Acute Rehab Services [phone]    Bevelyn Buckles 07/04/2023, 1:14 PM

## 2023-07-04 NOTE — Discharge Instructions (Addendum)
Continue home PD treatments as before unchanged.    Follow with Primary MD Inez Pilgrim, NP in 3 days   Get CBC, CMP, Magnesium -  checked next visit with your primary MD    Activity: As tolerated with Full fall precautions use walker/cane & assistance as needed  Disposition Home   Diet: Renal-low carbohydrate diet with 1.2 L fluid restriction per day, check CBGs q. [facility] S.  Special Instructions: If you have smoked or chewed Tobacco  in the last 2 yrs please stop smoking, stop any regular Alcohol  and or any Recreational drug use.  On your next visit with your primary care physician please Get Medicines reviewed and adjusted.  Please request your Prim.MD to go over all Hospital Tests and Procedure/Radiological results at the follow up, please get all Hospital records sent to your Prim MD by signing hospital release before you go home.  If you experience worsening of your admission symptoms, develop shortness of breath, life threatening emergency, suicidal or homicidal thoughts you must seek medical attention immediately by calling 911 or calling your MD immediately  if symptoms less severe.  You Must read complete instructions/literature along with all the possible adverse reactions/side effects for all the Medicines you take and that have been prescribed to you. Take any new Medicines after you have completely understood and accpet all the possible adverse reactions/side effects.

## 2023-07-04 NOTE — Progress Notes (Signed)
[facility] Progress Note   Subjective:   Pt reports he is going home today. Feels better, thinks he is strong enough to do PD at home. Denies SOB, CP, dizziness, abdominal pain, nausea. No issues with PD overnight.   Objective Vitals:   07/04/23 0052 07/04/23 0340 07/04/23 0558 07/04/23 0755  BP: 99/62 121/74 114/81 111/64  Pulse: 69 72  80  Resp: 19 (!) 21  (!) 21  Temp:  98.2 F (36.8 C)  98.3 F (36.8 C)  TempSrc:  Oral  Oral  SpO2: 92% 90%  96%  Weight:       Physical Exam General: Alert male in NAD Heart: RRR, no murmurs, rubs or gallops Lungs: CTA bilaterally, respirations unlabored Abdomen:  Soft, non-tender, non-distended, +BS Extremities: No edema b/l lower extremities Dialysis Access:  PD cath in abdomen, no surrounding TTP  Additional Objective Labs: Basic Metabolic Panel: Recent Labs  Lab 07/01/23 0426 07/02/23 1050 07/03/23 0735 07/04/23 0528  NA 136 135 135 135  K 4.5 3.9 3.7 3.5  CL 105 108 103 104  CO2 22 22 22 22   GLUCOSE 226* 150* 129* 155*  BUN 49* 41* 40* 44*  CREATININE 3.93* 3.12* 3.31* 3.49*  CALCIUM 8.5* 7.4* 7.8* 7.7*  PHOS 3.5  --   --   --    Liver Function Tests: Recent Labs  Lab 06/30/23 1127 07/01/23 0426  AST 16  --   ALT 13  --   ALKPHOS 78  --   BILITOT 0.8  --   PROT 6.5  --   ALBUMIN 3.4* 3.1*   No results for input(s): "LIPASE", "AMYLASE" in the last 168 hours. CBC: Recent Labs  Lab 06/30/23 1244 07/01/23 0426 07/02/23 1050 07/03/23 0735 07/04/23 0528  WBC 12.8* 13.0* 9.9 7.5 7.9  NEUTROABS  --   --  7.2 4.5 4.7  HGB 11.1* 10.3* 9.5* 9.5* 9.9*  HCT 32.3* 30.5* 27.7* 27.0* 27.8*  MCV 91.5 89.2 91.4 90.0 88.3  PLT 206 194 170 174 177   Blood Culture    Component Value Date/Time   SDES BLOOD LEFT ANTECUBITAL 12/02/2022 0955   SPECREQUEST  12/02/2022 0955    BOTTLES DRAWN AEROBIC AND ANAEROBIC Blood Culture adequate volume   CULT  12/02/2022 0955    NO GROWTH 5 DAYS Performed at [facility], [address] [place], [place] [address]    REPTSTATUS 12/07/2022 FINAL 12/02/2022 0955    Cardiac Enzymes: No results for input(s): "CKTOTAL", "CKMB", "CKMBINDEX", "TROPONINI" in the last 168 hours. CBG: Recent Labs  Lab 07/02/23 2132 07/03/23 0748 07/03/23 1337 07/03/23 2208 07/04/23 0752  GLUCAP 125* 110* 187* 181* 157*   Iron Studies: No results for input(s): "IRON", "TIBC", "TRANSFERRIN", "FERRITIN" in the last 72 hours. @lablastinr3 @ Studies/Results: No results found. Medications:  dialysis solution 1.5% low-MG/low-CA      Chlorhexidine Gluconate Cloth  6 each Topical Daily   gabapentin  200 mg Oral BID   gentamicin cream  1 Application Topical Daily   guaiFENesin  600 mg Oral BID   heparin  5,000 Units Subcutaneous Q8H   insulin aspart  0-6 Units Subcutaneous TID WC   insulin glargine-yfgn  8 Units Subcutaneous Daily   metoCLOPramide (REGLAN) injection  5 mg Intravenous TID AC   metoprolol tartrate  75 mg Oral Daily   sodium chloride flush  3 mL Intravenous Q12H   tamsulosin  0.4 mg Oral QPC supper   torsemide  20 mg Oral Daily  traZODone  100 mg Oral QHS    Outpatient Dialysis Orders: [facility]   3 exchanges per day, goes on 9am and comes off around 9 pm    2800 cc fill volume, dwell time 3.5hrs    dry wt 83.5kg     - last Hb 10.0 on 8/07   - last mircera 30 mcg sq on 05/09/23   - last pth 223 on 8/07, no vdra   Home meds include - albuterol, xanax, lipitor, gabapentin 200 bid, hydralazine 50 tid, insulin glargine/ lispro, metoclopramide 5mg  bid, metoprolol 75 bid, prn zofran, flomax, torsmide 40 every day, trazodone, prns    CXR - no acute disease     Assessment/Plan: Gen'd weakness - related to acute COVID infection and vol depletion most likely. Improving  Volume - appeared dehydrated on admission, under dry wt but closer now. Eating/ drinking some now. Will resume home torsemide 20 every day. Cont 1.5% fluids for now w/ PD.   ESRD -  on CAPD, manual daytime exchanges. Got PD overnight last night. Will cont nightly PD while here imitating his daytime CAPD schedule.  HTN - catapres patch was added here when he was too nauseous to take pills. Appears to be back on his home meds, BP is controlled. Anemia esrd - Hgb 9- 11 range. Follow trend for now and will start an ESA if indicated.  MBD ckd - CCa controlled, phos at goal DM2 - per pmd    Rogers Blocker, PA-C 07/04/2023, 10:14 AM  [facility] Pager: [phone]

## 2023-07-04 NOTE — Progress Notes (Signed)
   07/04/23 0730  Peritoneal Catheter Right lower abdomen  No placement date or time found.   Catheter Location: Right lower abdomen  Site Assessment Clean, Dry, Intact  Drainage Description None  Catheter status Deaccessed  Dressing Gauze/Drain sponge  Dressing Status Clean, Dry, Intact  Completion  Treatment Status Complete  Initial Drain Volume 437  Average Dwell Time-Hour(s) 3  Average Dwell Time-Min(s) 0  Average Drain Time 21  Total Therapy Volume 7500  Total Therapy Time-Hour(s) 10  Total Therapy Time-Min(s) 45  Weight after Drain  (floor to weigh per primary RN)  Effluent Appearance Clear;Yellow  Cell Count on Daytime Exchange N/A  Fluid Balance - CCPD  Total UF (+ value on cycler, pt loss) 711 mL  Total UF (- value on cycler, pt gain) 0 mL  Procedure Comments  Tolerated treatment well? Yes  Peritoneal Dialysis Comments no complications---pts transfer set put back to [facility] so pt can discharge today  Education / Care Plan  Dialysis Education Provided Yes  Hand-off documentation  Hand-off Given Given to shift RN/LPN  Report given to (Full Name) olayinka williams  Hand-off Received Received from shift RN/LPN  Report received from (Full Name) Kathrin Greathouse   PD post treatment note  PD treatment completed. Patient tolerated treatment well. PD effluent is clear. No specimen collected.  PD exit site clean, dry and intact. Patient is awake, oriented and in no acute distress.  Report given to bedside nurse.   Post treatment VS:---see flowsheet---WNL   Total UF removed:  see flowsheet  Post treatment weight: see flowsheet

## 2023-09-12 ENCOUNTER — Encounter: Payer: Self-pay | Admitting: "Endocrinology

## 2023-09-12 ENCOUNTER — Ambulatory Visit (INDEPENDENT_AMBULATORY_CARE_PROVIDER_SITE_OTHER): Payer: 59 | Admitting: "Endocrinology

## 2023-09-12 VITALS — BP 160/100 | HR 71 | Ht 71.0 in | Wt 174.0 lb

## 2023-09-12 DIAGNOSIS — Z992 Dependence on renal dialysis: Secondary | ICD-10-CM

## 2023-09-12 DIAGNOSIS — E1122 Type 2 diabetes mellitus with diabetic chronic kidney disease: Secondary | ICD-10-CM

## 2023-09-12 DIAGNOSIS — Z794 Long term (current) use of insulin: Secondary | ICD-10-CM | POA: Diagnosis not present

## 2023-09-12 DIAGNOSIS — E1165 Type 2 diabetes mellitus with hyperglycemia: Secondary | ICD-10-CM

## 2023-09-12 DIAGNOSIS — N186 End stage renal disease: Secondary | ICD-10-CM

## 2023-09-12 LAB — POCT GLYCOSYLATED HEMOGLOBIN (HGB A1C): Hemoglobin A1C: 6.1 % — AB (ref 4.0–5.6)

## 2023-09-12 MED ORDER — FREESTYLE LIBRE 3 PLUS SENSOR MISC
1 refills | Status: AC
Start: 2023-09-12 — End: ?

## 2023-09-12 MED ORDER — HUMULIN 70/30 KWIKPEN (70-30) 100 UNIT/ML ~~LOC~~ SUPN
8.0000 [IU] | PEN_INJECTOR | Freq: Every day | SUBCUTANEOUS | 1 refills | Status: AC
Start: 2023-09-12 — End: ?

## 2023-09-12 NOTE — Patient Instructions (Signed)

## 2023-09-12 NOTE — Progress Notes (Signed)
Outpatient Endocrinology Note Jose Liebenthal, MD  09/12/23   Jose [name] [birth year] [id number]  Referring Provider: Inez Pilgrim, NP Primary Care Provider: Inez Pilgrim, NP Reason for consultation: Subjective   Assessment & Plan  Diagnoses and all orders for this visit:  Controlled type 2 diabetes mellitus with chronic kidney disease on chronic dialysis, with long-term current use of insulin (HCC) -     POCT glycosylated hemoglobin (Hb A1C) -     HUMULIN 70/30 KWIKPEN (70-30) 100 UNIT/ML KwikPen; Inject 8 Units into the skin daily. -     Continuous Glucose Sensor (FREESTYLE LIBRE 3 PLUS SENSOR) MISC; Change sensor every 15 days.   Diabetes Type II complicated by 6.8, No results found for: "GFR" Hba1c goal less than 7, current Hba1c is  Lab Results  Component Value Date   HGBA1C 6.1 (A) 09/12/2023   Will recommend the following: Humulin 70/30 insulin: 8 units of insulin qam Humalog insulin 4-6 units with meals-15 min before meals  Target blood sugar is 120-150 two hours after meals  No known contraindications to any of above medications  -Last LD and Tg are as follows: Lab Results  Component Value Date   LDLCALC 57 11/22/2019    Lab Results  Component Value Date   TRIG 174 (H) 08/17/2021   -not on statin since on peritoneal dialysis  -Follow low fat diet and exercise   -On peritoneal dialysis due to ESRD due to diabetes  Reviewed and counseled on: -A1C target -Blood sugar targets -Complications of uncontrolled diabetes  -Checking blood sugar before meals and bedtime and bring log next visit -All medications with mechanism of action and side effects -Hypoglycemia management: rule of 15's, Glucagon Emergency Kit and medical alert ID -low-carb low-fat plate-method diet -At least 20 minutes of physical activity per day -Annual dilated retinal eye exam and foot exam -compliance and follow up needs -follow up as scheduled or earlier if problem gets  worse  Call if blood sugar is less than 70 or consistently above 250    Take a 15 gm snack of carbohydrate at bedtime before you go to sleep if your blood sugar is less than 100.    If you are going to fast after midnight for a test or procedure, ask your physician for instructions on how to reduce/decrease your insulin dose.    Call if blood sugar is less than 70 or consistently above 250  -Treating a low sugar by rule of 15  (15 gms of sugar every 15 min until sugar is more than 70) If you feel your sugar is low, test your sugar to be sure If your sugar is low (less than 70), then take 15 grams of a fast acting Carbohydrate (3-4 glucose tablets or glucose gel or 4 ounces of juice or regular soda) Recheck your sugar 15 min after treating low to make sure it is more than 70 If sugar is still less than 70, treat again with 15 grams of carbohydrate          Don't drive the hour of hypoglycemia  If unconscious/unable to eat or drink by mouth, use glucagon injection or nasal spray baqsimi and call 911. Can repeat again in 15 min if still unconscious.  Return in about 3 months (around 12/13/2023).   I have reviewed current medications, nurse's notes, allergies, vital signs, past medical and surgical history, family medical history, and social history for this encounter. Counseled patient on symptoms, examination findings, lab findings, imaging  results, treatment decisions and monitoring and prognosis. The patient understood the recommendations and agrees with the treatment plan. All questions regarding treatment plan were fully answered.  Jose [place], MD  09/12/23    History of Present Illness Jose [name] is a 47 y.o. year old male who presents for follow up of Type II diabetes mellitus.  Jose [name] was first diagnosed at age 36.   Diabetes education +  Home diabetes regimen: Humulin 70/30 8 units of insulin qam Humalog insulin 4-6 units with meals-15 min before meals   Target blood sugar is 120-150 two hours after meals    Previous history: He was initially put on medications at the time of diagnosis but only in his early 61s Non-insulin hypoglycemic drugs previously used: Trulicity, Byetta, Amaryl, metformin, Tradjenta, Farxiga and Invokana Insulin was started in 2022 He has previously been taking Levemir and Lantus insulin as well as NovoLog Side effects from medications: Trulicity caused nausea and tremor  COMPLICATIONS -  MI/Stroke +  retinopathy +  neuropathy +  nephropathy, on peritoneal dialysis   BLOOD SUGAR DATA  CGM interpretation: At today's visit, we reviewed her CGM downloads. The full report is scanned in the media. Reviewing the CGM trends, BG are well controlled across the day.  Physical Exam  BP (!) 160/100   Pulse 71   Ht 5\' 11"  (1.803 m)   Wt 174 lb (78.9 kg)   SpO2 98%   BMI 24.27 kg/m    Constitutional: well developed, well nourished Head: normocephalic, atraumatic Eyes: sclera anicteric, no redness Neck: supple Lungs: normal respiratory effort Neurology: alert and oriented Skin: dry, no appreciable rashes Musculoskeletal: no appreciable defects Psychiatric: normal mood and affect Diabetic Foot Exam - Simple   No data filed      Current Medications Patient's Medications  New Prescriptions   CONTINUOUS GLUCOSE SENSOR (FREESTYLE LIBRE 3 PLUS SENSOR) MISC    Change sensor every 15 days.  Previous Medications   ACETAMINOPHEN (TYLENOL) 500 MG TABLET    Take 500-1,000 mg by mouth every 6 (six) hours as needed for mild pain.   ALBUTEROL (VENTOLIN HFA) 108 (90 BASE) MCG/ACT INHALER    Inhale 1 puff into the lungs every 4 (four) hours as needed for wheezing or shortness of breath.   ALPRAZOLAM (XANAX) 0.25 MG TABLET    Take 0.25 mg by mouth 2 (two) times daily as needed for anxiety.   CHOLECALCIFEROL (VITAMIN D3) 25 MCG (1000 UNIT) TABLET    Take 1,000 Units by mouth daily.   CONTINUOUS BLOOD GLUC SENSOR (FREESTYLE  LIBRE 3 SENSOR) MISC    1 Device by Does not apply route every 14 (fourteen) days. Apply 1 sensor on upper arm every 14 days for continuous glucose monitoring   DICLOFENAC SODIUM (VOLTAREN) 1 % GEL    Apply 1 Application topically 2 (two) times daily as needed (shoulder pain).   GABAPENTIN (NEURONTIN) 100 MG CAPSULE    Take 2 capsules (200 mg total) by mouth 2 (two) times daily.   INSULIN LISPRO (HUMALOG KWIKPEN) 100 UNIT/ML KWIKPEN    Inject 10 Units into the skin 3 (three) times daily.   INSULIN NPH-REGULAR HUMAN (HUMULIN 70/30) (70-30) 100 UNIT/ML INJECTION    Inject 8 Units into the skin daily.   INSULIN PEN NEEDLE (BD PEN NEEDLE NANO 2ND GEN) 32G X 4 MM MISC    USE AS DIRECTED FOUR TIMES A DAY.   METOCLOPRAMIDE (REGLAN) 5 MG TABLET    Take 5 mg  by mouth in the morning and at bedtime.   METOPROLOL TARTRATE 75 MG TABS    Take 1 tablet (75 mg total) by mouth daily.   ONDANSETRON (ZOFRAN) 24 MG TABLET    Take 24 mg by mouth daily as needed for nausea.   POLYETHYLENE GLYCOL (MIRALAX / GLYCOLAX) 17 G PACKET    Take 17 g by mouth daily as needed for moderate constipation, mild constipation or severe constipation.   TAMSULOSIN (FLOMAX) 0.4 MG CAPS CAPSULE    Take 1 capsule (0.4 mg total) by mouth daily after supper.   TORSEMIDE (DEMADEX) 20 MG TABLET    Take 20 mg by mouth daily.   TRAZODONE (DESYREL) 100 MG TABLET    Take 100 mg by mouth at bedtime.  Modified Medications   Modified Medication Previous Medication   HUMULIN 70/30 KWIKPEN (70-30) 100 UNIT/ML KWIKPEN HUMULIN 70/30 KWIKPEN (70-30) 100 UNIT/ML KwikPen      Inject 8 Units into the skin daily.    Inject 8 Units into the skin daily.  Discontinued Medications   INSULIN GLARGINE, 1 UNIT DIAL, (TOUJEO SOLOSTAR) 300 UNIT/ML SOLOSTAR PEN    Inject 20 Units into the skin daily. Adjust as directed    Allergies Allergies  Allergen Reactions   Tirzepatide Rash   Cranberry Itching   Hm Lidocaine Patch [Lidocaine] Dermatitis    Blisters skin     Melatonin Other (See Comments)    nightmares   Nsaids Other (See Comments)    Stage 3 kidney disease   Trulicity [Dulaglutide] Other (See Comments)    Night sweats  Uncontrolled tempeture     Past Medical History Past Medical History:  Diagnosis Date   Anemia    ESRD on hemodialysis (HCC)    started in Oct 2023, HD at [facility]   Family history of adverse reaction to anesthesia    daughter age 43 months , coded after recieving 1 injection  for surgery - an inhalation med and morphine   GAD (generalized anxiety disorder)    Gastroparesis    Headache    Hyperlipidemia    Hypertension    Macular degeneration, bilateral    Pneumonia    Retinopathy    Type II diabetes mellitus with complication, uncontrolled    retinopathy, neuropathy, microalbuminuria    Past Surgical History Past Surgical History:  Procedure Laterality Date   APPENDECTOMY     AV FISTULA PLACEMENT Left 10/13/2022   Procedure: LEFT ARM ARTERIOVENOUS (AV) GRAFT CREATION;  Surgeon: Leonie Douglas, MD;  Location: [facility] OR;  Service: Vascular;  Laterality: Left;   BIOPSY  08/10/2021   Procedure: BIOPSY;  Surgeon: Kathi Der, MD;  Location: [facility];  Service: Gastroenterology;;   BUBBLE STUDY  07/29/2021   Procedure: BUBBLE STUDY;  Surgeon: Jake Bathe, MD;  Location: [facility];  Service: Cardiovascular;;   CAPD INSERTION N/A 08/25/2022   Procedure: LAPAROSCOPIC INSERTION CONTINUOUS AMBULATORY PERITONEAL DIALYSIS  (CAPD) CATHETER WITH OMENTOPEXY;  Surgeon: Leonie Douglas, MD;  Location: [facility] OR;  Service: Vascular;  Laterality: N/A;   CAPD REMOVAL N/A 10/13/2022   Procedure: LAPAROSCOPIC REMOVAL OF CONTINUOUS AMBULATORY PERITONEAL DIALYSIS  (CAPD) CATHETER;  Surgeon: Leonie Douglas, MD;  Location: [facility];  Service: Vascular;  Laterality: N/A;   ESOPHAGOGASTRODUODENOSCOPY (EGD) WITH PROPOFOL N/A 08/10/2021   Procedure: ESOPHAGOGASTRODUODENOSCOPY (EGD) WITH PROPOFOL;  Surgeon: Kathi Der, MD;   Location: [facility];  Service: Gastroenterology;  Laterality: N/A;   ESOPHAGOGASTRODUODENOSCOPY (EGD) WITH PROPOFOL N/A 08/24/2021   Procedure:  ESOPHAGOGASTRODUODENOSCOPY (EGD) WITH PROPOFOL;  Surgeon: Kathi Der, MD;  Location: [facility];  Service: Gastroenterology;  Laterality: N/A;   HERNIA REPAIR     IR FLUORO GUIDED NEEDLE PLC ASPIRATION/INJECTION LOC  07/28/2021   LUMBAR LAMINECTOMY/DECOMPRESSION MICRODISCECTOMY N/A 08/07/2021   Procedure: THORACIC FOUR - THORACIC FIVE LAMINECTOMY/DECOMPRESSION OF SPINAL CORD, DEBRIDEMENT OF ABSCESS, MICRODISCECTOMY, INTRAOPERATIVE ULTRASOUND;  Surgeon: Lisbeth Renshaw, MD;  Location: [facility];  Service: Neurosurgery;  Laterality: N/A;   TEE WITHOUT CARDIOVERSION N/A 07/29/2021   Procedure: TRANSESOPHAGEAL ECHOCARDIOGRAM (TEE);  Surgeon: Jake Bathe, MD;  Location[facility] END[facility];  Service: Cardiovascular;  Laterality: N/A;   TRIGGER FINGER RELEASE Right 10/25/2019   Procedure: RIGHT INDEX FINGER RELEASE TRIGGER FINGER/A-1 PULLEY;  Surgeon: Cindee Salt, MD;  Location: [facility];  Service: Orthopedics;  Laterality: Right;  IV REGIONAL FOREARM BLOCK    Family History family history includes ADD / ADHD in his brother and son; Diabetes in his father and mother; Hyperlipidemia in his brother and mother; Stroke in his brother and mother.  Social History Social History   Socioeconomic History   Marital status: Married    Spouse name: Not on file   Number of children: 2   Years of education: Not on file   Highest education level: Bachelor's degree (e.g., BA, AB, BS)  Occupational History   Occupation: Public house manager: [employer]  Tobacco Use   Smoking status: Former    Current packs/day: 0.00    Average packs/day: 1 pack/day for 20.0 years (20.0 ttl pk-yrs)    Types: Cigarettes    Start date: 07/2000    Quit date: 07/2020    Years since quitting: 3.1   Smokeless tobacco: Never  Vaping Use    Vaping status: Never Used  Substance and Sexual Activity   Alcohol use: Not Currently   Drug use: Never   Sexual activity: Yes    Partners: Female    Birth control/protection: None    Comment: with monogamous partner  Other Topics Concern   Not on file  Social History Narrative   Pt has lived majority of life in [place]. Lives at home with partner, 2 kids, 8 cats, and 1 dog.    Social Determinants of Health   Financial Resource Strain: Low Risk  (11/25/2022)   Received from [facility], [facility]   Overall Financial Resource Strain (CARDIA)    Difficulty of Paying Living Expenses: Not hard at all  Food Insecurity: No Food Insecurity (07/01/2023)   Hunger Vital Sign    Worried About Running Out of Food in the Last Year: Never true    Ran Out of Food in the Last Year: Never true  Transportation Needs: No Transportation Needs (07/01/2023)   PRAPARE - Administrator, Civil Service (Medical): No    Lack of Transportation (Non-Medical): No  Physical Activity: Insufficiently Active (05/05/2018)   Exercise Vital Sign    Days of Exercise per Week: 4 days    Minutes of Exercise per Session: 20 min  Stress: No Stress Concern Present (12/15/2022)   Received from [facility], [facility] - Occupational Stress Questionnaire    Feeling of Stress : Only a little  Social Connections: Unknown (11/10/2022)   Received from [facility], [facility]   Social Network    Social Network: Not on file  Intimate Partner Violence: Not At Risk (07/01/2023)   Humiliation, Afraid, Rape, and Kick questionnaire  Fear of Current or Ex-Partner: No    Emotionally Abused: No    Physically Abused: No    Sexually Abused: No    Lab Results  Component Value Date   HGBA1C 6.1 (A) 09/12/2023   HGBA1C 6.8 (A) 05/12/2023   HGBA1C 7.6 (H) 01/07/2023   Lab Results  Component Value Date   CHOL 116 11/22/2019   Lab Results  Component Value Date    HDL 42 11/22/2019   Lab Results  Component Value Date   LDLCALC 57 11/22/2019   Lab Results  Component Value Date   TRIG 174 (H) 08/17/2021   Lab Results  Component Value Date   CHOLHDL 2.8 11/22/2019   Lab Results  Component Value Date   CREATININE 3.49 (H) 07/04/2023   No results found for: "GFR" No results found for: "MICROALBUR", "MALB24HUR"    Component Value Date/Time   NA 135 07/04/2023 0528   NA 141 11/22/2019 1012   K 3.5 07/04/2023 0528   CL 104 07/04/2023 0528   CO2 22 07/04/2023 0528   GLUCOSE 155 (H) 07/04/2023 0528   BUN 44 (H) 07/04/2023 0528   BUN 20 11/22/2019 1012   CREATININE 3.49 (H) 07/04/2023 0528   CREATININE 2.48 (H) 12/18/2021 1407   CALCIUM 7.7 (L) 07/04/2023 0528   PROT 6.5 06/30/2023 1127   PROT 6.5 11/22/2019 1012   ALBUMIN 3.1 (L) 07/01/2023 0426   ALBUMIN 4.4 11/22/2019 1012   AST 16 06/30/2023 1127   ALT 13 06/30/2023 1127   ALKPHOS 78 06/30/2023 1127   BILITOT 0.8 06/30/2023 1127   BILITOT 0.3 11/22/2019 1012   GFRNONAA 21 (L) 07/04/2023 0528   GFRAA 82 11/22/2019 1012      Latest Ref Rng & Units 07/04/2023    5:28 AM 07/03/2023    7:35 AM 07/02/2023   10:50 AM  BMP  Glucose 70 - 99 mg/dL 454  098  119   BUN 6 - 20 mg/dL 44  40  41   Creatinine 0.61 - 1.24 mg/dL 1.47  8.29  5.62   Sodium 135 - 145 mmol/L 135  135  135   Potassium 3.5 - 5.1 mmol/L 3.5  3.7  3.9   Chloride 98 - 111 mmol/L 104  103  108   CO2 22 - 32 mmol/L 22  22  22    Calcium 8.9 - 10.3 mg/dL 7.7  7.8  7.4        Component Value Date/Time   WBC 7.9 07/04/2023 0528   RBC 3.15 (L) 07/04/2023 0528   HGB 9.9 (L) 07/04/2023 0528   HGB 13.3 05/05/2018 1211   HCT 27.8 (L) 07/04/2023 0528   HCT 40.2 05/05/2018 1211   PLT 177 07/04/2023 0528   PLT 272 05/05/2018 1211   MCV 88.3 07/04/2023 0528   MCV 88 05/05/2018 1211   MCH 31.4 07/04/2023 0528   MCHC 35.6 07/04/2023 0528   RDW 12.0 07/04/2023 0528   RDW 13.2 05/05/2018 1211   LYMPHSABS 2.0 07/04/2023 0528    LYMPHSABS 2.4 05/05/2018 1211   MONOABS 1.0 07/04/2023 0528   EOSABS 0.1 07/04/2023 0528   EOSABS 0.2 05/05/2018 1211   BASOSABS 0.0 07/04/2023 0528   BASOSABS 0.0 05/05/2018 1211     Parts of this note may have been dictated using voice recognition software. There may be variances in spelling and vocabulary which are unintentional. Not all errors are proofread. Please notify the Thereasa Parkin if any discrepancies are noted or if the meaning of  any statement is not clear.

## 2023-11-06 ENCOUNTER — Encounter (HOSPITAL_COMMUNITY): Payer: Self-pay

## 2023-11-06 ENCOUNTER — Other Ambulatory Visit: Payer: Self-pay

## 2023-11-06 ENCOUNTER — Observation Stay (HOSPITAL_COMMUNITY)
Admission: EM | Admit: 2023-11-06 | Discharge: 2023-11-08 | Disposition: A | Discharge status: Home / Self Care | Payer: 59 | Attending: Internal Medicine | Admitting: Internal Medicine

## 2023-11-06 DIAGNOSIS — D631 Anemia in chronic kidney disease: Secondary | ICD-10-CM | POA: Diagnosis not present

## 2023-11-06 DIAGNOSIS — N400 Enlarged prostate without lower urinary tract symptoms: Secondary | ICD-10-CM | POA: Diagnosis not present

## 2023-11-06 DIAGNOSIS — N186 End stage renal disease: Secondary | ICD-10-CM | POA: Diagnosis not present

## 2023-11-06 DIAGNOSIS — E1122 Type 2 diabetes mellitus with diabetic chronic kidney disease: Secondary | ICD-10-CM | POA: Diagnosis not present

## 2023-11-06 DIAGNOSIS — I120 Hypertensive chronic kidney disease with stage 5 chronic kidney disease or end stage renal disease: Secondary | ICD-10-CM | POA: Insufficient documentation

## 2023-11-06 DIAGNOSIS — R112 Nausea with vomiting, unspecified: Principal | ICD-10-CM | POA: Insufficient documentation

## 2023-11-06 DIAGNOSIS — Z992 Dependence on renal dialysis: Secondary | ICD-10-CM | POA: Diagnosis not present

## 2023-11-06 DIAGNOSIS — I10 Essential (primary) hypertension: Secondary | ICD-10-CM | POA: Diagnosis present

## 2023-11-06 DIAGNOSIS — E1143 Type 2 diabetes mellitus with diabetic autonomic (poly)neuropathy: Secondary | ICD-10-CM | POA: Diagnosis not present

## 2023-11-06 DIAGNOSIS — F411 Generalized anxiety disorder: Secondary | ICD-10-CM | POA: Diagnosis not present

## 2023-11-06 DIAGNOSIS — Z87891 Personal history of nicotine dependence: Secondary | ICD-10-CM | POA: Insufficient documentation

## 2023-11-06 DIAGNOSIS — D72829 Elevated white blood cell count, unspecified: Secondary | ICD-10-CM | POA: Insufficient documentation

## 2023-11-06 DIAGNOSIS — Z794 Long term (current) use of insulin: Secondary | ICD-10-CM | POA: Diagnosis not present

## 2023-11-06 DIAGNOSIS — E113591 Type 2 diabetes mellitus with proliferative diabetic retinopathy without macular edema, right eye: Secondary | ICD-10-CM | POA: Insufficient documentation

## 2023-11-06 DIAGNOSIS — R000 Tachycardia, unspecified: Secondary | ICD-10-CM | POA: Diagnosis not present

## 2023-11-06 DIAGNOSIS — D638 Anemia in other chronic diseases classified elsewhere: Secondary | ICD-10-CM | POA: Diagnosis present

## 2023-11-06 LAB — CBC WITH DIFFERENTIAL/PLATELET
Abs Immature Granulocytes: 0.1 10*3/uL — ABNORMAL HIGH (ref 0.00–0.07)
Basophils Absolute: 0 10*3/uL (ref 0.0–0.1)
Basophils Relative: 0 %
Eosinophils Absolute: 0 10*3/uL (ref 0.0–0.5)
Eosinophils Relative: 0 %
HCT: 30 % — ABNORMAL LOW (ref 39.0–52.0)
Hemoglobin: 10.7 g/dL — ABNORMAL LOW (ref 13.0–17.0)
Immature Granulocytes: 1 %
Lymphocytes Relative: 6 %
Lymphs Abs: 1.3 10*3/uL (ref 0.7–4.0)
MCH: 31 pg (ref 26.0–34.0)
MCHC: 35.7 g/dL (ref 30.0–36.0)
MCV: 87 fL (ref 80.0–100.0)
Monocytes Absolute: 2.2 10*3/uL — ABNORMAL HIGH (ref 0.1–1.0)
Monocytes Relative: 10 %
Neutro Abs: 17.9 10*3/uL — ABNORMAL HIGH (ref 1.7–7.7)
Neutrophils Relative %: 83 %
Platelets: 249 10*3/uL (ref 150–400)
RBC: 3.45 MIL/uL — ABNORMAL LOW (ref 4.22–5.81)
RDW: 11.8 % (ref 11.5–15.5)
WBC: 21.5 10*3/uL — ABNORMAL HIGH (ref 4.0–10.5)
nRBC: 0 % (ref 0.0–0.2)

## 2023-11-06 LAB — I-STAT CHEM 8, ED
BUN: 82 mg/dL — ABNORMAL HIGH (ref 6–20)
Calcium, Ion: 0.98 mmol/L — ABNORMAL LOW (ref 1.15–1.40)
Chloride: 94 mmol/L — ABNORMAL LOW (ref 98–111)
Creatinine, Ser: 6.4 mg/dL — ABNORMAL HIGH (ref 0.61–1.24)
Glucose, Bld: 214 mg/dL — ABNORMAL HIGH (ref 70–99)
HCT: 29 % — ABNORMAL LOW (ref 39.0–52.0)
Hemoglobin: 9.9 g/dL — ABNORMAL LOW (ref 13.0–17.0)
Potassium: 3.9 mmol/L (ref 3.5–5.1)
Sodium: 130 mmol/L — ABNORMAL LOW (ref 135–145)
TCO2: 25 mmol/L (ref 22–32)

## 2023-11-06 LAB — LIPASE, BLOOD: Lipase: 84 U/L — ABNORMAL HIGH (ref 11–51)

## 2023-11-06 LAB — COMPREHENSIVE METABOLIC PANEL
ALT: 12 U/L (ref 0–44)
AST: 14 U/L — ABNORMAL LOW (ref 15–41)
Albumin: 3.7 g/dL (ref 3.5–5.0)
Alkaline Phosphatase: 73 U/L (ref 38–126)
Anion gap: 15 (ref 5–15)
BUN: 79 mg/dL — ABNORMAL HIGH (ref 6–20)
CO2: 21 mmol/L — ABNORMAL LOW (ref 22–32)
Calcium: 8.6 mg/dL — ABNORMAL LOW (ref 8.9–10.3)
Chloride: 93 mmol/L — ABNORMAL LOW (ref 98–111)
Creatinine, Ser: 5.94 mg/dL — ABNORMAL HIGH (ref 0.61–1.24)
GFR, Estimated: 11 mL/min — ABNORMAL LOW (ref >60)
Glucose, Bld: 213 mg/dL — ABNORMAL HIGH (ref 70–99)
Potassium: 3.9 mmol/L (ref 3.5–5.1)
Sodium: 129 mmol/L — ABNORMAL LOW (ref 135–145)
Total Bilirubin: 1.2 mg/dL (ref 0.0–1.2)
Total Protein: 6.6 g/dL (ref 6.5–8.1)

## 2023-11-06 LAB — I-STAT CG4 LACTIC ACID, ED: Lactic Acid, Venous: 1.1 mmol/L (ref 0.5–1.9)

## 2023-11-06 LAB — GLUCOSE, CAPILLARY
Glucose-Capillary: 151 mg/dL — ABNORMAL HIGH (ref 70–99)
Glucose-Capillary: 156 mg/dL — ABNORMAL HIGH (ref 70–99)

## 2023-11-06 MED ORDER — ACETAMINOPHEN 325 MG PO TABS: 650.0000 mg | ORAL_TABLET | Freq: Four times a day (QID) | ORAL | Status: DC | PRN

## 2023-11-06 MED ORDER — HEPARIN SODIUM (PORCINE) 5000 UNIT/ML IJ SOLN
5000.0000 [IU] | Freq: Three times a day (TID) | INTRAMUSCULAR | Status: DC
  Administered 2023-11-06 – 2023-11-08 (×6): 5000 [IU] via SUBCUTANEOUS
  Filled 2023-11-06 (×6): qty 1

## 2023-11-06 MED ORDER — SODIUM CHLORIDE 0.9 % IV BOLUS
250.0000 mL | Freq: Once | INTRAVENOUS | Status: AC
  Administered 2023-11-06: 250 mL via INTRAVENOUS

## 2023-11-06 MED ORDER — DELFLEX-LC/2.5% DEXTROSE 394 MOSM/L IP SOLN: INTRAPERITONEAL | Status: DC

## 2023-11-06 MED ORDER — METOPROLOL TARTRATE 50 MG PO TABS
50.0000 mg | ORAL_TABLET | Freq: Two times a day (BID) | ORAL | Status: DC
  Administered 2023-11-06 – 2023-11-08 (×5): 50 mg via ORAL
  Filled 2023-11-06: qty 2
  Filled 2023-11-06 (×4): qty 1

## 2023-11-06 MED ORDER — GABAPENTIN 100 MG PO CAPS
200.0000 mg | ORAL_CAPSULE | Freq: Every day | ORAL | Status: DC
  Administered 2023-11-06 – 2023-11-08 (×3): 200 mg via ORAL
  Filled 2023-11-06 (×3): qty 2

## 2023-11-06 MED ORDER — INSULIN ASPART 100 UNIT/ML IJ SOLN
0.0000 [IU] | Freq: Three times a day (TID) | INTRAMUSCULAR | Status: DC
  Administered 2023-11-07 (×3): 1 [IU] via SUBCUTANEOUS
  Administered 2023-11-08: 2 [IU] via SUBCUTANEOUS
  Administered 2023-11-08: 1 [IU] via SUBCUTANEOUS

## 2023-11-06 MED ORDER — TAMSULOSIN HCL 0.4 MG PO CAPS
0.4000 mg | ORAL_CAPSULE | Freq: Every day | ORAL | Status: DC
  Administered 2023-11-06 – 2023-11-07 (×2): 0.4 mg via ORAL
  Filled 2023-11-06 (×2): qty 1

## 2023-11-06 MED ORDER — HYDROMORPHONE HCL 1 MG/ML IJ SOLN
0.5000 mg | Freq: Once | INTRAMUSCULAR | Status: AC
  Administered 2023-11-06: 0.5 mg via INTRAVENOUS
  Filled 2023-11-06: qty 1

## 2023-11-06 MED ORDER — DIPHENHYDRAMINE HCL 50 MG/ML IJ SOLN
25.0000 mg | Freq: Once | INTRAMUSCULAR | Status: AC
  Administered 2023-11-06: 25 mg via INTRAVENOUS
  Filled 2023-11-06: qty 1

## 2023-11-06 MED ORDER — INSULIN GLARGINE-YFGN 100 UNIT/ML ~~LOC~~ SOLN
5.0000 [IU] | Freq: Every day | SUBCUTANEOUS | Status: DC
  Administered 2023-11-06 – 2023-11-07 (×2): 5 [IU] via SUBCUTANEOUS
  Filled 2023-11-06 (×3): qty 0.05

## 2023-11-06 MED ORDER — PANTOPRAZOLE SODIUM 40 MG PO TBEC
40.0000 mg | DELAYED_RELEASE_TABLET | Freq: Every day | ORAL | Status: DC
  Administered 2023-11-06 – 2023-11-08 (×3): 40 mg via ORAL
  Filled 2023-11-06 (×3): qty 1

## 2023-11-06 MED ORDER — ALUM & MAG HYDROXIDE-SIMETH 200-200-20 MG/5ML PO SUSP
30.0000 mL | Freq: Once | ORAL | Status: AC
  Administered 2023-11-06: 30 mL via ORAL
  Filled 2023-11-06: qty 30

## 2023-11-06 MED ORDER — SODIUM CHLORIDE 0.9 % IV SOLN
8.0000 mg | Freq: Four times a day (QID) | INTRAVENOUS | Status: DC | PRN
  Administered 2023-11-06 – 2023-11-07 (×3): 8 mg via INTRAVENOUS
  Filled 2023-11-06 (×4): qty 4

## 2023-11-06 MED ORDER — ACETAMINOPHEN 650 MG RE SUPP: 650.0000 mg | Freq: Four times a day (QID) | RECTAL | Status: DC | PRN

## 2023-11-06 MED ORDER — METOCLOPRAMIDE HCL 5 MG/ML IJ SOLN
10.0000 mg | Freq: Once | INTRAMUSCULAR | Status: AC
  Administered 2023-11-06: 10 mg via INTRAVENOUS
  Filled 2023-11-06: qty 2

## 2023-11-06 MED ORDER — GENTAMICIN SULFATE 0.1 % EX CREA
1.0000 | TOPICAL_CREAM | Freq: Every day | CUTANEOUS | Status: DC
  Filled 2023-11-06: qty 15

## 2023-11-06 MED ORDER — ALPRAZOLAM 0.25 MG PO TABS
0.2500 mg | ORAL_TABLET | Freq: Two times a day (BID) | ORAL | Status: DC | PRN
  Administered 2023-11-06 – 2023-11-07 (×2): 0.25 mg via ORAL
  Filled 2023-11-06 (×2): qty 1

## 2023-11-06 MED ORDER — SEVELAMER CARBONATE 800 MG PO TABS
1600.0000 mg | ORAL_TABLET | Freq: Three times a day (TID) | ORAL | Status: DC
  Administered 2023-11-06 – 2023-11-08 (×5): 1600 mg via ORAL
  Filled 2023-11-06 (×6): qty 2

## 2023-11-06 MED ORDER — METOCLOPRAMIDE HCL 5 MG/ML IJ SOLN
10.0000 mg | Freq: Three times a day (TID) | INTRAMUSCULAR | Status: DC
  Administered 2023-11-06 – 2023-11-07 (×2): 10 mg via INTRAVENOUS
  Filled 2023-11-06 (×2): qty 2

## 2023-11-06 MED ORDER — DELFLEX-LC/1.5% DEXTROSE 344 MOSM/L IP SOLN: INTRAPERITONEAL | Status: DC

## 2023-11-06 MED ORDER — SODIUM CHLORIDE 0.9 % IV BOLUS
500.0000 mL | Freq: Once | INTRAVENOUS | Status: AC
Start: 2023-11-06 — End: 2023-11-06
  Administered 2023-11-06: 500 mL via INTRAVENOUS

## 2023-11-06 NOTE — H&P (Signed)
 History and Physical    Patient: Jose [name] FMW:[id number] DOB: 1976/03/24 DOA: 11/06/2023 DOS: the patient was seen and examined on 11/06/2023 PCP: Pridgen, Taylar, NP  Patient coming from: Home  Chief Complaint:  Chief Complaint  Patient presents with   Abdominal Pain   Nausea   HPI: Jose [name] is a 48 y.o. male with medical history significant of ESRD on peritoneal dialysis, anemia of ESRD, type 2 diabetes with retinopathy and history of diabetic ulcer, diabetic gastroparesis presenting with intractable nausea and vomiting for the past 4 days.  Patient states that he suddenly began having nausea and vomiting about 4 days ago.  He has had prior history of nausea and vomiting several times in the past, thought to be due to diabetic gastroparesis.  His last admission was in September 2024 where he was hospitalized for likely gastroparesis flare with superimposed COVID infection.  He states that he was in his usual state of health until about 4 days ago.  He began experiencing nausea and vomiting after having lunch and it progressively worsened over the past 4 days.  He has tried as needed Zofran  at home every 4 hours for the past 4 days without any improvement in his symptoms.  He does report running out of Reglan  at some point in the past and thus has not been adherent with this medication.  He denies any fevers, chills, chest pain, shortness of breath, abdominal pain, diarrhea.  He states that his vomiting has been nonbilious and nonbloody, though does report 1 episode of emesis last night that was slightly blood-tinged.  Vomiting today has been without any blood.  He does also report decreased p.o. intake over the past 4 days, has been able to drink small sips of water  or ginger ale but has not been able to eat anything over this time.  Presented to the ED as his symptoms were not abating despite oral medications.  He does report taking humulin  70/30 insulin  at 8u BID along with  sliding scale insulin  at home. Has not taken insulin  since yesterday given poor PO intake. He also reports compliance with home PD, last performed yesterday. States he has not experienced any difficulties with this and denies any abdominal pain.  ED course: Vital signs stable, blood pressure elevated to the 180s/90s. CBC with WBC 21.5, stable hemoglobin. CMP with mild hyponatremia, glucose 213, mild metabolic acidosis. Lipase 84 (elevated in past). Lactate normal. CXR unremarkable. TRH asked to evaluate patient for admission given N/V unimproved with oral antiemetics.    Review of Systems: As mentioned in the history of present illness. All other systems reviewed and are negative. Past Medical History:  Diagnosis Date   Anemia    ESRD on hemodialysis (HCC)    started in Oct 2023, HD at [facility]   Family history of adverse reaction to anesthesia    daughter age 88 months , coded after recieving 1 injection  for surgery - an inhalation med and morphine    GAD (generalized anxiety disorder)    Gastroparesis    Headache    Hyperlipidemia    Hypertension    Macular degeneration, bilateral    Pneumonia    Retinopathy    Type II diabetes mellitus with complication, uncontrolled    retinopathy, neuropathy, microalbuminuria   Past Surgical History:  Procedure Laterality Date   APPENDECTOMY     AV FISTULA PLACEMENT Left 10/13/2022   Procedure: LEFT ARM ARTERIOVENOUS (AV) GRAFT CREATION;  Surgeon: Magda Debby SAILOR, MD;  Location: [facility] OR;  Service: Vascular;  Laterality: Left;   BIOPSY  08/10/2021   Procedure: BIOPSY;  Surgeon: Elicia Claw, MD;  Location: [facility] ENDOSCOPY;  Service: Gastroenterology;;   BUBBLE STUDY  07/29/2021   Procedure: BUBBLE STUDY;  Surgeon: Jeffrie Oneil BROCKS, MD;  Location: [facility] ENDOSCOPY;  Service: Cardiovascular;;   CAPD INSERTION N/A 08/25/2022   Procedure: LAPAROSCOPIC INSERTION CONTINUOUS AMBULATORY PERITONEAL DIALYSIS  (CAPD) CATHETER WITH OMENTOPEXY;  Surgeon: Magda Debby SAILOR, MD;  Location: [facility] OR;  Service: Vascular;  Laterality: N/A;   CAPD REMOVAL N/A 10/13/2022   Procedure: LAPAROSCOPIC REMOVAL OF CONTINUOUS AMBULATORY PERITONEAL DIALYSIS  (CAPD) CATHETER;  Surgeon: Magda Debby SAILOR, MD;  Location: [facility] OR;  Service: Vascular;  Laterality: N/A;   ESOPHAGOGASTRODUODENOSCOPY (EGD) WITH PROPOFOL  N/A 08/10/2021   Procedure: ESOPHAGOGASTRODUODENOSCOPY (EGD) WITH PROPOFOL ;  Surgeon: Elicia Claw, MD;  Location: [facility];  Service: Gastroenterology;  Laterality: N/A;   ESOPHAGOGASTRODUODENOSCOPY (EGD) WITH PROPOFOL  N/A 08/24/2021   Procedure: ESOPHAGOGASTRODUODENOSCOPY (EGD) WITH PROPOFOL ;  Surgeon: Elicia Claw, MD;  Location: [facility];  Service: Gastroenterology;  Laterality: N/A;   HERNIA REPAIR     IR FLUORO GUIDED NEEDLE PLC ASPIRATION/INJECTION LOC  07/28/2021   LUMBAR LAMINECTOMY/DECOMPRESSION MICRODISCECTOMY N/A 08/07/2021   Procedure: THORACIC FOUR - THORACIC FIVE LAMINECTOMY/DECOMPRESSION OF SPINAL CORD, DEBRIDEMENT OF ABSCESS, MICRODISCECTOMY, INTRAOPERATIVE ULTRASOUND;  Surgeon: Lanis Pupa, MD;  Location: [facility] OR;  Service: Neurosurgery;  Laterality: N/A;   TEE WITHOUT CARDIOVERSION N/A 07/29/2021   Procedure: TRANSESOPHAGEAL ECHOCARDIOGRAM (TEE);  Surgeon: Jeffrie Oneil BROCKS, MD;  Location: [facility] ENDOSCOPY;  Service: Cardiovascular;  Laterality: N/A;   TRIGGER FINGER RELEASE Right 10/25/2019   Procedure: RIGHT INDEX FINGER RELEASE TRIGGER FINGER/A-1 PULLEY;  Surgeon: Murrell Kuba, MD;  Location: [facility];  Service: Orthopedics;  Laterality: Right;  IV REGIONAL FOREARM BLOCK   Social History:  reports that he quit smoking about 3 years ago. His smoking use included cigarettes. He started smoking about 23 years ago. He has a 20 pack-year smoking history. He has never used smokeless tobacco. He reports that he does not currently use alcohol. He reports that he does not use drugs.  Allergies  Allergen Reactions    Tirzepatide  Rash   Cranberry Itching   Hm Lidocaine  Patch [Lidocaine ] Dermatitis    Blisters skin    Melatonin Other (See Comments)    nightmares   Nsaids Other (See Comments)    Stage 3 kidney disease   Trulicity  [Dulaglutide ] Other (See Comments)    Night sweats  Uncontrolled tempeture     Family History  Problem Relation Age of Onset   Diabetes Mother    Hyperlipidemia Mother    Stroke Mother    Diabetes Father    Hyperlipidemia Brother    Stroke Brother    ADD / ADHD Brother    ADD / ADHD Son     Prior to Admission medications   Medication Sig Start Date End Date Taking? Authorizing Provider  pantoprazole  (PROTONIX ) 40 MG tablet Take 40 mg by mouth daily. 07/13/23  Yes [provider]  sevelamer  carbonate (RENVELA ) 800 MG tablet Take 1,600 mg by mouth 3 (three) times daily. 10/18/23  Yes [provider]  acetaminophen  (TYLENOL ) 500 MG tablet Take 500-1,000 mg by mouth every 6 (six) hours as needed for mild pain.   Yes [provider]  albuterol  (VENTOLIN  HFA) 108 (90 Base) MCG/ACT inhaler Inhale 1 puff into the lungs every 4 (four) hours as needed for wheezing or shortness of breath. 05/27/21  Yes [provider]  ALPRAZolam  (XANAX ) 0.25 MG tablet Take 0.25 mg by mouth 2 (two) times daily as needed for anxiety. 07/22/22  Yes [provider]  cholecalciferol  (VITAMIN D3) 25 MCG (1000 UNIT) tablet Take 1,000 Units by mouth daily.   Yes [provider]  Continuous Blood Gluc Sensor (FREESTYLE LIBRE 3 SENSOR) MISC 1 Device by Does not apply route every 14 (fourteen) days. Apply 1 sensor on upper arm every 14 days for continuous glucose monitoring 01/10/23   Von Pacific, MD  Continuous Glucose Sensor (FREESTYLE LIBRE 3 PLUS SENSOR) MISC Change sensor every 15 days. 09/12/23   Motwani, Komal, MD  gabapentin  (NEURONTIN ) 100 MG capsule Take 2 capsules (200 mg total) by mouth 2 (two) times daily. 09/21/21   Love, Sharlet RAMAN, PA-C  HUMULIN   70/30 KWIKPEN (70-30) 100 UNIT/ML KwikPen Inject 8 Units into the skin daily. 09/12/23   Motwani, Komal, MD  insulin  lispro (HUMALOG  KWIKPEN) 100 UNIT/ML KwikPen Inject 10 Units into the skin 3 (three) times daily. Patient taking differently: Inject 2 Units into the skin 3 (three) times daily between meals as needed (For high blood sugar). 10/04/22   Von [place], MD  insulin  NPH-regular Human (HUMULIN  70/30) (70-30) 100 UNIT/ML injection Inject 8 Units into the skin daily.    [provider]  Insulin  Pen Needle (BD PEN NEEDLE NANO 2ND GEN) 32G X 4 MM MISC USE AS DIRECTED FOUR TIMES A DAY. 11/01/22   Von [place], MD  metoCLOPramide  (REGLAN ) 5 MG tablet Take 5 mg by mouth in the morning and at bedtime.    [provider]  Metoprolol  Tartrate 75 MG TABS Take 1 tablet (75 mg total) by mouth daily. 07/05/23   Singh, Prashant K, MD  ondansetron  (ZOFRAN ) 24 MG tablet Take 24 mg by mouth daily as needed for nausea.    [provider]  polyethylene glycol (MIRALAX  / GLYCOLAX ) 17 g packet Take 17 g by mouth daily as needed for moderate constipation, mild constipation or severe constipation. 08/29/21   Arlice Reichert, MD  tamsulosin  (FLOMAX ) 0.4 MG CAPS capsule Take 1 capsule (0.4 mg total) by mouth daily after supper. 09/21/21   Love, Sharlet RAMAN, PA-C  torsemide  (DEMADEX ) 20 MG tablet Take 20 mg by mouth daily.    [provider]  traZODone  (DESYREL ) 100 MG tablet Take 100 mg by mouth at bedtime.    [provider]    Physical Exam: Vitals:   11/06/23 1000 11/06/23 1100 11/06/23 1200 11/06/23 1209  BP: (!) 191/94 (!) 185/95 (!) 169/88   Pulse: (!) 111 (!) 103 97   Resp: 20 15 15    Temp:    98.2 F (36.8 C)  TempSrc:    Oral  SpO2: 98% 97% 96%   Weight:      Height:       Physical Exam Constitutional:      Appearance: He is well-developed and normal weight. He is not ill-appearing.  HENT:     Head: Normocephalic and atraumatic.     Mouth/Throat:      Pharynx: No oropharyngeal exudate.     Comments: Dry mucus membranes Eyes:     General: No scleral icterus.    Extraocular Movements: Extraocular movements intact.     Pupils: Pupils are equal, round, and reactive to light.  Cardiovascular:     Rate and Rhythm: Regular rhythm. Tachycardia present.     Heart sounds: Normal heart sounds. No murmur heard.    No friction rub.  No gallop.  Pulmonary:     Effort: Pulmonary effort is normal.     Breath sounds: Normal breath sounds. No wheezing, rhonchi or rales.  Abdominal:     General: Bowel sounds are decreased. There is no distension.     Palpations: Abdomen is soft.     Tenderness: There is no abdominal tenderness. There is no guarding or rebound.     Hernia: No hernia is present.     Comments: Peritoneal dialysis site in RLQ, clean/dry/intact. No surrounding redness or erythema. No tenderness to palpation around PD site.  Skin:    General: Skin is warm and dry.  Neurological:     General: No focal deficit present.     Mental Status: He is alert and oriented to person, place, and time.  Psychiatric:        Mood and Affect: Mood normal.        Behavior: Behavior normal.     Data Reviewed:  There are no new results to review at this time.  Assessment and Plan: No notes have been filed under this hospital service. Service: Hospitalist  Intractable N/V Poor PO intake History of diabetic gastroparesis Patient presenting with 4-day history of progressively worsening nausea and vomiting despite use of as needed Zofran  at home.  He does have a history of diabetic gastroparesis but reports nonadherence with home Reglan  as he ran out of it at some point in the past.  He does endorse associated decreased oral intake but otherwise denies any other signs or symptoms.  His abdominal exam is unrevealing.  Given absence of abdominal pain or other localizing symptoms on physical exam, will hold off on CT abdomen at this time.  Will provide IV  Reglan  to assess improvement in symptoms along with small IVF bolus for rehydration.  Will maintain n.p.o. status at this time given he is vomiting with oral intake, but advance as tolerated to renal diet. -IV Reglan  10 mg q8h -250cc NS bolus -Check QTc interval on EKG -NPO, advance as tolerated to renal diet -consider CT abdomen if no improvement in symptoms or worsening -Subcutaneous heparin  for DVT prophylaxis -f/u peritoneal fluid cell count and gram stain to rule out infection -place in observation  Leukocytosis Noted on CBC. Likely secondary to intractable nausea and vomiting given lack of infectious signs/symptoms. Remains afebrile since arrival and lactate normal. Will check peritoneal fluid studies to ensure no infection. -f/u peritoneal fluid cell count and gram stain -trend fever and WBC curve -no antibiotics at this time -can consider CT abd if develops any localizing symptoms  Tachycardia Mildly tachycardic with regular rhythm. Likely from intractable N/V. Will check EKG to ensure sinus rhythm and also to check Qtc. -f/u EKG -NS bolus 250cc once  Chronic uncontrolled HTN Appears to have chronically uncontrolled HTN. He takes metoprolol  tartrate 75mg  daily, which is unusual given typical BID dosing. BP is 180s-190s/90s, asymptomatic. Will start metoprolol  tartrate 50mg  BID and assess improvement in BP. Can add prn IV hydralazine  if needed for better BP control. 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 A1c was 6.1% on 09/12/2023. Will start semglee  5u daily (given poor PO intake) and very sensitive SSI given ESRD status. His CBGs are above goal at this time. He has a history of diabetic gastroparesis, which is likely contributing to presenting signs/symptoms. -  A1c 6.1% on  09/12/2023 -start semglee  5u daily  -very sensitive SSI -trend CBGs, goal 140-180 -Gabapentin  200 mg daily  ESRD on peritoneal dialysis -nephrology consulted and following, appreciate assistance -continue renvela  -f/u body fluid cell count and gram stain -Trend renal function panel  Anemia of ESRD -hemoglobin stable, follow  Generalized anxiety disorder Patient takes Xanax  0.25 mg twice daily as needed for anxiety.  PDMP reviewed and appropriate. -Continue Xanax  0.25 mg twice daily as needed  BPH -continue home flomax    Advance Care Planning:   Code Status: Full Code   Consults: nephrology  Family Communication: no family at bedside  Severity of Illness: The appropriate patient status for this patient is OBSERVATION. Observation status is judged to be reasonable and necessary in order to provide the required intensity of service to ensure the patient's safety. The patient's presenting symptoms, physical exam findings, and initial radiographic and laboratory data in the context of their medical condition is felt to place them at decreased risk for further clinical deterioration. Furthermore, it is anticipated that the patient will be medically stable for discharge from the hospital within 2 midnights of admission.   Portions of this note were generated with Scientist, clinical (histocompatibility and immunogenetics). Dictation errors may occur despite best attempts at proofreading.  Author: Nachum Derossett H Damen Windsor, MD 11/06/2023 1:36 PM  For on call review [email or web address].

## 2023-11-06 NOTE — ED Triage Notes (Signed)
 Pt BIB EMS from home for lower right abd pain and nausea that started Thursday. Pt does peritoneal dialysis everyday but was unable to last night due to vomiting. EMS gave 4mg  of zofran and 250 cc of NS. Positive orthostatics. Axox4.

## 2023-11-06 NOTE — ED Notes (Signed)
 Pt threw up after oral meds, MD notified

## 2023-11-06 NOTE — ED Provider Notes (Signed)
 [facility] Provider Note   CSN: [id number] Arrival date & time: 11/06/23  9073     History  Chief Complaint  Patient presents with   Abdominal Pain   Nausea    Jose [name] is a 48 y.o. male.  48 yo M with a chief complaints of nausea vomiting going on for a few days now.  Not able to keep anything down.  Gets peritoneal dialysis but was unable to do it yesterday due to his persistent vomiting.  He has had trouble sleeping the past couple days.  No fevers.  Having some diarrhea.   Abdominal Pain      Home Medications Prior to Admission medications   Medication Sig Start Date End Date Taking? Authorizing Provider  acetaminophen  (TYLENOL ) 500 MG tablet Take 500-1,000 mg by mouth every 6 (six) hours as needed for mild pain.   Yes [provider]  albuterol  (VENTOLIN  HFA) 108 (90 Base) MCG/ACT inhaler Inhale 1 puff into the lungs every 4 (four) hours as needed for wheezing or shortness of breath. 05/27/21  Yes [provider]  ALPRAZolam  (XANAX ) 0.25 MG tablet Take 0.25 mg by mouth 2 (two) times daily as needed for anxiety. 07/22/22  Yes [provider]  cholecalciferol  (VITAMIN D3) 25 MCG (1000 UNIT) tablet Take 1,000 Units by mouth daily.   Yes [provider]  gabapentin  (NEURONTIN ) 100 MG capsule Take 2 capsules (200 mg total) by mouth 2 (two) times daily. 09/21/21  Yes Love, Sharlet RAMAN, PA-C  HUMULIN  70/30 KWIKPEN (70-30) 100 UNIT/ML KwikPen Inject 8 Units into the skin daily. 09/12/23  Yes Motwani, Komal, MD  insulin  lispro (HUMALOG  KWIKPEN) 100 UNIT/ML KwikPen Inject 10 Units into the skin 3 (three) times daily. Patient taking differently: Inject 2 Units into the skin 3 (three) times daily between meals as needed (For high blood sugar). 10/04/22  Yes Von Pacific, MD  metoCLOPramide  (REGLAN ) 5 MG tablet Take 5 mg by mouth in the morning and at bedtime.   Yes [provider]  Metoprolol   Tartrate 75 MG TABS Take 1 tablet (75 mg total) by mouth daily. 07/05/23  Yes Dennise Lavada MARLA, MD  ondansetron  (ZOFRAN ) 24 MG tablet Take 24 mg by mouth daily as needed for nausea.   Yes [provider]  pantoprazole  (PROTONIX ) 40 MG tablet Take 40 mg by mouth daily. 07/13/23  Yes [provider]  polyethylene glycol (MIRALAX  / GLYCOLAX ) 17 g packet Take 17 g by mouth daily as needed for moderate constipation, mild constipation or severe constipation. 08/29/21  Yes Dahal, Chapman, MD  sevelamer  carbonate (RENVELA ) 800 MG tablet Take 1,600 mg by mouth 3 (three) times daily. 10/18/23  Yes [provider]  tamsulosin  (FLOMAX ) 0.4 MG CAPS capsule Take 1 capsule (0.4 mg total) by mouth daily after supper. 09/21/21  Yes Love, Sharlet RAMAN, PA-C  torsemide  (DEMADEX ) 20 MG tablet Take 20 mg by mouth daily.   Yes [provider]  traZODone  (DESYREL ) 100 MG tablet Take 100 mg by mouth at bedtime.   Yes [provider]  Continuous Blood Gluc Sensor (FREESTYLE LIBRE 3 SENSOR) MISC 1 Device by Does not apply route every 14 (fourteen) days. Apply 1 sensor on upper arm every 14 days for continuous glucose monitoring 01/10/23   Von Pacific, MD  Continuous Glucose Sensor (FREESTYLE LIBRE 3 PLUS SENSOR) MISC Change sensor every 15 days. 09/12/23   Motwani, Komal, MD  insulin  NPH-regular Human (HUMULIN  70/30) (70-30)  100 UNIT/ML injection Inject 8 Units into the skin daily.    [provider]  Insulin  Pen Needle (BD PEN NEEDLE NANO 2ND GEN) 32G X 4 MM MISC USE AS DIRECTED FOUR TIMES A DAY. 11/01/22   Von Pacific, MD      Allergies    Tirzepatide , Cranberry, Hm lidocaine  patch [lidocaine ], Melatonin, Nsaids, and Trulicity  [dulaglutide ]    Review of Systems   Review of Systems  Gastrointestinal:  Positive for abdominal pain.    Physical Exam Updated Vital Signs BP (!) 152/88   Pulse 78   Temp 98.2 F (36.8 C) (Oral)   Resp 18   Ht 5' 11 (1.803 m)   Wt 79.4 kg    SpO2 98%   BMI 24.41 kg/m  Physical Exam Vitals and nursing note reviewed.  Constitutional:      Appearance: He is well-developed.  HENT:     Head: Normocephalic and atraumatic.  Eyes:     Pupils: Pupils are equal, round, and reactive to light.  Neck:     Vascular: No JVD.  Cardiovascular:     Rate and Rhythm: Normal rate and regular rhythm.     Heart sounds: No murmur heard.    No friction rub. No gallop.  Pulmonary:     Effort: No respiratory distress.     Breath sounds: No wheezing.  Abdominal:     General: There is no distension.     Tenderness: There is no abdominal tenderness. There is no guarding or rebound.     Comments: Peritoneal dialysis catheter in place.  No obvious erythema or drainage at the site.  Mild diffuse abdominal discomfort.  Musculoskeletal:        General: Normal range of motion.     Cervical back: Normal range of motion and neck supple.  Skin:    Coloration: Skin is not pale.     Findings: No rash.  Neurological:     Mental Status: He is alert and oriented to person, place, and time.  Psychiatric:        Behavior: Behavior normal.     ED Results / Procedures / Treatments   Labs (all labs ordered are listed, but only abnormal results are displayed) Labs Reviewed  CBC WITH DIFFERENTIAL/PLATELET - Abnormal; Notable for the following components:      Result Value   WBC 21.5 (*)    RBC 3.45 (*)    Hemoglobin 10.7 (*)    HCT 30.0 (*)    Neutro Abs 17.9 (*)    Monocytes Absolute 2.2 (*)    Abs Immature Granulocytes 0.10 (*)    All other components within normal limits  COMPREHENSIVE METABOLIC PANEL - Abnormal; Notable for the following components:   Sodium 129 (*)    Chloride 93 (*)    CO2 21 (*)    Glucose, Bld 213 (*)    BUN 79 (*)    Creatinine, Ser 5.94 (*)    Calcium  8.6 (*)    AST 14 (*)    GFR, Estimated 11 (*)    All other components within normal limits  LIPASE, BLOOD - Abnormal; Notable for the following components:   Lipase  84 (*)    All other components within normal limits  I-STAT CHEM 8, ED - Abnormal; Notable for the following components:   Sodium 130 (*)    Chloride 94 (*)    BUN 82 (*)    Creatinine, Ser 6.40 (*)    Glucose, Bld 214 (*)  Calcium , Ion 0.98 (*)    Hemoglobin 9.9 (*)    HCT 29.0 (*)    All other components within normal limits  BODY FLUID CULTURE W GRAM STAIN  BODY FLUID CELL COUNT WITH DIFFERENTIAL  I-STAT CG4 LACTIC ACID, ED    EKG None  Radiology No results found.  Procedures Procedures    Medications Ordered in ED Medications  insulin  glargine-yfgn (SEMGLEE ) injection 5 Units (has no administration in time range)  insulin  aspart (novoLOG ) injection 0-6 Units (has no administration in time range)  heparin  injection 5,000 Units (5,000 Units Subcutaneous Given 11/06/23 1316)  acetaminophen  (TYLENOL ) tablet 650 mg (has no administration in time range)    Or  acetaminophen  (TYLENOL ) suppository 650 mg (has no administration in time range)  ALPRAZolam  (XANAX ) tablet 0.25 mg (has no administration in time range)  gabapentin  (NEURONTIN ) capsule 200 mg (200 mg Oral Given 11/06/23 1321)  pantoprazole  (PROTONIX ) EC tablet 40 mg (40 mg Oral Given 11/06/23 1321)  sevelamer  carbonate (RENVELA ) tablet 1,600 mg (1,600 mg Oral Given 11/06/23 1321)  tamsulosin  (FLOMAX ) capsule 0.4 mg (has no administration in time range)  metoprolol  tartrate (LOPRESSOR ) tablet 50 mg (50 mg Oral Given 11/06/23 1321)  metoCLOPramide  (REGLAN ) injection 10 mg (has no administration in time range)  sodium chloride  0.9 % bolus 250 mL (has no administration in time range)  gentamicin  cream (GARAMYCIN ) 0.1 % 1 Application (has no administration in time range)  dialysis solution 1.5% low-MG/low-CA dianeal  solution (has no administration in time range)  dialysis solution 2.5% low-MG/low-CA dianeal  solution (has no administration in time range)  HYDROmorphone  (DILAUDID ) injection 0.5 mg (0.5 mg Intravenous Given  11/06/23 0957)  metoCLOPramide  (REGLAN ) injection 10 mg (10 mg Intravenous Given 11/06/23 0956)  diphenhydrAMINE  (BENADRYL ) injection 25 mg (25 mg Intravenous Given 11/06/23 0954)  sodium chloride  0.9 % bolus 500 mL (0 mLs Intravenous Stopped 11/06/23 1209)  alum & mag hydroxide-simeth (MAALOX/MYLANTA) 200-200-20 MG/5ML suspension 30 mL (30 mLs Oral Given 11/06/23 1046)  metoCLOPramide  (REGLAN ) injection 10 mg (10 mg Intravenous Given 11/06/23 1230)  HYDROmorphone  (DILAUDID ) injection 0.5 mg (0.5 mg Intravenous Given 11/06/23 1232)    ED Course/ Medical Decision Making/ A&P                                 Medical Decision Making Amount and/or Complexity of Data Reviewed Labs: ordered.  Risk OTC drugs. Prescription drug management. Decision regarding hospitalization.   48 yo M with a chief complaints of nausea vomiting and abdominal discomfort.  Going on for about 4 days now.  Patient gets peritoneal dialysis every evening.  He was unable to do it last night due to persistent discomfort and vomiting.  On record review the patient is on the transplant list is actually scheduled to get 1 any day now 1 comes available.  Will obtain a laboratory evaluation.  Treat pain and nausea.  Reassess.  Patient with signs of dehydration on his blood work, hyponatremia hypochloremia bump in his renal function as well as BUN elevation.  Lactate was normal.   He has a leukocytosis with neutrophilic predominance.  No acute anemia.   Patient feeling much better on repeat assessment.  Will try an oral trial here.  Unable to tolerate by mouth.  Will redose antiemetics.  Will discuss with medicine for admission.  Discussed case with Dr. Susannah, nephrology.  Will schedule to arrange for peritoneal dialysis tonight in the hospital.  The  patients results and plan were reviewed and discussed.   Any x-rays performed were independently reviewed by myself.   Differential diagnosis were considered with the  presenting HPI.  Medications  insulin  glargine-yfgn (SEMGLEE ) injection 5 Units (has no administration in time range)  insulin  aspart (novoLOG ) injection 0-6 Units (has no administration in time range)  heparin  injection 5,000 Units (5,000 Units Subcutaneous Given 11/06/23 1316)  acetaminophen  (TYLENOL ) tablet 650 mg (has no administration in time range)    Or  acetaminophen  (TYLENOL ) suppository 650 mg (has no administration in time range)  ALPRAZolam  (XANAX ) tablet 0.25 mg (has no administration in time range)  gabapentin  (NEURONTIN ) capsule 200 mg (200 mg Oral Given 11/06/23 1321)  pantoprazole  (PROTONIX ) EC tablet 40 mg (40 mg Oral Given 11/06/23 1321)  sevelamer  carbonate (RENVELA ) tablet 1,600 mg (1,600 mg Oral Given 11/06/23 1321)  tamsulosin  (FLOMAX ) capsule 0.4 mg (has no administration in time range)  metoprolol  tartrate (LOPRESSOR ) tablet 50 mg (50 mg Oral Given 11/06/23 1321)  metoCLOPramide  (REGLAN ) injection 10 mg (has no administration in time range)  sodium chloride  0.9 % bolus 250 mL (has no administration in time range)  gentamicin  cream (GARAMYCIN ) 0.1 % 1 Application (has no administration in time range)  dialysis solution 1.5% low-MG/low-CA dianeal  solution (has no administration in time range)  dialysis solution 2.5% low-MG/low-CA dianeal  solution (has no administration in time range)  HYDROmorphone  (DILAUDID ) injection 0.5 mg (0.5 mg Intravenous Given 11/06/23 0957)  metoCLOPramide  (REGLAN ) injection 10 mg (10 mg Intravenous Given 11/06/23 0956)  diphenhydrAMINE  (BENADRYL ) injection 25 mg (25 mg Intravenous Given 11/06/23 0954)  sodium chloride  0.9 % bolus 500 mL (0 mLs Intravenous Stopped 11/06/23 1209)  alum & mag hydroxide-simeth (MAALOX/MYLANTA) 200-200-20 MG/5ML suspension 30 mL (30 mLs Oral Given 11/06/23 1046)  metoCLOPramide  (REGLAN ) injection 10 mg (10 mg Intravenous Given 11/06/23 1230)  HYDROmorphone  (DILAUDID ) injection 0.5 mg (0.5 mg Intravenous Given 11/06/23 1232)     Vitals:   11/06/23 1100 11/06/23 1200 11/06/23 1209 11/06/23 1400  BP: (!) 185/95 (!) 169/88  (!) 152/88  Pulse: (!) 103 97  78  Resp: 15 15  18   Temp:   98.2 F (36.8 C)   TempSrc:   Oral   SpO2: 97% 96%  98%  Weight:      Height:        Final diagnoses:  Intractable nausea and vomiting    Admission/ observation were discussed with the admitting physician, patient and/or family and they are comfortable with the plan.          Final Clinical Impression(s) / ED Diagnoses Final diagnoses:  Intractable nausea and vomiting    Rx / DC Orders ED Discharge Orders     None         Emil Share, DO 11/06/23 1526

## 2023-11-06 NOTE — Consult Note (Signed)
 Renal Service Consult Note [facility]  Jose [name] 11/06/2023 Jose JONETTA Fret, MD Requesting Physician: Dr. Emmy  Reason for Consult: ESRD pt on PD w/ refractory N/V HPI: The patient is a 48 y.o. year-old w/ PMH as below who presented to ED c/o lower right abd pain and nausea that started on 3 days ago. Hx of ESRD on PD, anemia, DM2, gastroparesis. Last hosp stay was in Sept for gastroparesis w/ superimposed COVID 19 infection. He did report running out of reglan , using zofran  at home for relief mostly. No SOB, CP, no fevers, chills. In ED BP 180/90, wbc 21K, Hb okay. Lipase 84, LA wnl. CXR negative. Pt admitted to medical given persistent N/V despite antiemetics. We are asked to see for dialysis.   Pt seen in ED room. Pt does his PD mostly by himself, sometime his wife helps. Main c/o is N/V and some cramping. His usual wt is 175 lbs and does 3 exchanges of 2800 cc and 3.5 hrs duration each night. Pt feels dehydrated. Got 500 cc bolus in ED.   ROS - denies CP, no joint pain, no HA, no blurry vision, no rash, no diarrhea, no nausea/ vomiting, no dysuria, no difficulty voiding   Past Medical History  Past Medical History:  Diagnosis Date   Anemia    ESRD on hemodialysis (HCC)    started in Oct 2023, HD at [facility]   Family history of adverse reaction to anesthesia    daughter age 58 months , coded after recieving 1 injection  for surgery - an inhalation med and morphine    GAD (generalized anxiety disorder)    Gastroparesis    Headache    Hyperlipidemia    Hypertension    Macular degeneration, bilateral    Pneumonia    Retinopathy    Type II diabetes mellitus with complication, uncontrolled    retinopathy, neuropathy, microalbuminuria   Past Surgical History  Past Surgical History:  Procedure Laterality Date   APPENDECTOMY     AV FISTULA PLACEMENT Left 10/13/2022   Procedure: LEFT ARM ARTERIOVENOUS (AV) GRAFT CREATION;  Surgeon: Magda Debby SAILOR, MD;   Location: [facility] OR;  Service: Vascular;  Laterality: Left;   BIOPSY  08/10/2021   Procedure: BIOPSY;  Surgeon: Elicia Claw, MD;  Location: [facility] ENDOSCOPY;  Service: Gastroenterology;;   BUBBLE STUDY  07/29/2021   Procedure: BUBBLE STUDY;  Surgeon: Jeffrie Oneil BROCKS, MD;  Location: [facility] ENDOSCOPY;  Service: Cardiovascular;;   CAPD INSERTION N/A 08/25/2022   Procedure: LAPAROSCOPIC INSERTION CONTINUOUS AMBULATORY PERITONEAL DIALYSIS  (CAPD) CATHETER WITH OMENTOPEXY;  Surgeon: Magda Debby SAILOR, MD;  Location: [facility] OR;  Service: Vascular;  Laterality: N/A;   CAPD REMOVAL N/A 10/13/2022   Procedure: LAPAROSCOPIC REMOVAL OF CONTINUOUS AMBULATORY PERITONEAL DIALYSIS  (CAPD) CATHETER;  Surgeon: Magda Debby SAILOR, MD;  Location: [facility] OR;  Service: Vascular;  Laterality: N/A;   ESOPHAGOGASTRODUODENOSCOPY (EGD) WITH PROPOFOL  N/A 08/10/2021   Procedure: ESOPHAGOGASTRODUODENOSCOPY (EGD) WITH PROPOFOL ;  Surgeon: Elicia Claw, MD;  Location: [facility];  Service: Gastroenterology;  Laterality: N/A;   ESOPHAGOGASTRODUODENOSCOPY (EGD) WITH PROPOFOL  N/A 08/24/2021   Procedure: ESOPHAGOGASTRODUODENOSCOPY (EGD) WITH PROPOFOL ;  Surgeon: Elicia Claw, MD;  Location: [facility];  Service: Gastroenterology;  Laterality: N/A;   HERNIA REPAIR     IR FLUORO GUIDED NEEDLE PLC ASPIRATION/INJECTION LOC  07/28/2021   LUMBAR LAMINECTOMY/DECOMPRESSION MICRODISCECTOMY N/A 08/07/2021   Procedure: THORACIC FOUR - THORACIC FIVE LAMINECTOMY/DECOMPRESSION OF SPINAL CORD, DEBRIDEMENT OF ABSCESS, MICRODISCECTOMY, INTRAOPERATIVE ULTRASOUND;  Surgeon: Lanis Pupa, MD;  Location: [facility] OR;  Service: Neurosurgery;  Laterality: N/A;   TEE WITHOUT CARDIOVERSION N/A 07/29/2021   Procedure: TRANSESOPHAGEAL ECHOCARDIOGRAM (TEE);  Surgeon: Jeffrie Oneil BROCKS, MD;  Location: [facility] ENDOSCOPY;  Service: Cardiovascular;  Laterality: N/A;   TRIGGER FINGER RELEASE Right 10/25/2019   Procedure: RIGHT INDEX FINGER RELEASE TRIGGER FINGER/A-1 PULLEY;   Surgeon: Murrell Kuba, MD;  Location: [facility];  Service: Orthopedics;  Laterality: Right;  IV REGIONAL FOREARM BLOCK   Family History  Family History  Problem Relation Age of Onset   Diabetes Mother    Hyperlipidemia Mother    Stroke Mother    Diabetes Father    Hyperlipidemia Brother    Stroke Brother    ADD / ADHD Brother    ADD / ADHD Son    Social History  reports that he quit smoking about 3 years ago. His smoking use included cigarettes. He started smoking about 23 years ago. He has a 20 pack-year smoking history. He has never used smokeless tobacco. He reports that he does not currently use alcohol. He reports that he does not use drugs. Allergies  Allergies  Allergen Reactions   Tirzepatide  Rash   Cranberry Itching   Hm Lidocaine  Patch [Lidocaine ] Dermatitis    Blisters skin    Melatonin Other (See Comments)    nightmares   Nsaids Other (See Comments)    Stage 3 kidney disease   Trulicity  [Dulaglutide ] Other (See Comments)    Night sweats  Uncontrolled tempeture    Home medications Prior to Admission medications   Medication Sig Start Date End Date Taking? Authorizing Provider  acetaminophen  (TYLENOL ) 500 MG tablet Take 500-1,000 mg by mouth every 6 (six) hours as needed for mild pain.   Yes [provider]  albuterol  (VENTOLIN  HFA) 108 (90 Base) MCG/ACT inhaler Inhale 1 puff into the lungs every 4 (four) hours as needed for wheezing or shortness of breath. 05/27/21  Yes [provider]  ALPRAZolam  (XANAX ) 0.25 MG tablet Take 0.25 mg by mouth 2 (two) times daily as needed for anxiety. 07/22/22  Yes [provider]  cholecalciferol  (VITAMIN D3) 25 MCG (1000 UNIT) tablet Take 1,000 Units by mouth daily.   Yes [provider]  gabapentin  (NEURONTIN ) 100 MG capsule Take 2 capsules (200 mg total) by mouth 2 (two) times daily. 09/21/21  Yes Love, Sharlet RAMAN, PA-C  HUMULIN  70/30 KWIKPEN (70-30) 100 UNIT/ML KwikPen Inject 8 Units  into the skin daily. 09/12/23  Yes Motwani, Komal, MD  insulin  lispro (HUMALOG  KWIKPEN) 100 UNIT/ML KwikPen Inject 10 Units into the skin 3 (three) times daily. Patient taking differently: Inject 2 Units into the skin 3 (three) times daily between meals as needed (For high blood sugar). 10/04/22  Yes Von Pacific, MD  metoCLOPramide  (REGLAN ) 5 MG tablet Take 5 mg by mouth in the morning and at bedtime.   Yes [provider]  Metoprolol  Tartrate 75 MG TABS Take 1 tablet (75 mg total) by mouth daily. 07/05/23  Yes Dennise Lavada POUR, MD  ondansetron  (ZOFRAN ) 24 MG tablet Take 24 mg by mouth daily as needed for nausea.   Yes [provider]  pantoprazole  (PROTONIX ) 40 MG tablet Take 40 mg by mouth daily. 07/13/23  Yes [provider]  polyethylene glycol (MIRALAX  / GLYCOLAX ) 17 g packet Take 17 g by mouth daily as needed for moderate constipation, mild constipation or severe constipation. 08/29/21  Yes Dahal, Chapman, MD  sevelamer  carbonate (RENVELA ) 800 MG tablet Take 1,600 mg by  mouth 3 (three) times daily. 10/18/23  Yes [provider]  tamsulosin  (FLOMAX ) 0.4 MG CAPS capsule Take 1 capsule (0.4 mg total) by mouth daily after supper. 09/21/21  Yes Love, Sharlet RAMAN, PA-C  torsemide  (DEMADEX ) 20 MG tablet Take 20 mg by mouth daily.   Yes [provider]  traZODone  (DESYREL ) 100 MG tablet Take 100 mg by mouth at bedtime.   Yes [provider]  Continuous Blood Gluc Sensor (FREESTYLE LIBRE 3 SENSOR) MISC 1 Device by Does not apply route every 14 (fourteen) days. Apply 1 sensor on upper arm every 14 days for continuous glucose monitoring 01/10/23   Von Pacific, MD  Continuous Glucose Sensor (FREESTYLE LIBRE 3 PLUS SENSOR) MISC Change sensor every 15 days. 09/12/23   Motwani, Komal, MD  insulin  NPH-regular Human (HUMULIN  70/30) (70-30) 100 UNIT/ML injection Inject 8 Units into the skin daily.    [provider]  Insulin  Pen Needle (BD PEN NEEDLE NANO  2ND GEN) 32G X 4 MM MISC USE AS DIRECTED FOUR TIMES A DAY. 11/01/22   Von Pacific, MD     Vitals:   11/06/23 1000 11/06/23 1100 11/06/23 1200 11/06/23 1209  BP: (!) 191/94 (!) 185/95 (!) 169/88   Pulse: (!) 111 (!) 103 97   Resp: 20 15 15    Temp:    98.2 F (36.8 C)  TempSrc:    Oral  SpO2: 98% 97% 96%   Weight:      Height:       Exam Gen alert, no distress, looks uncomfortable No rash, cyanosis or gangrene Sclera anicteric, throat clear  No jvd or bruits Chest clear bilat to bases, no rales/ wheezing RRR no MRG Abd soft ntnd no mass or ascites +bs GU nl male MS no joint effusions or deformity Ext no LE or UE edema, no other edema Neuro is alert, Ox 3 , nf    PD cath mid-abdomen intact   Home meds of interest: - metoprolol  75 every day/ torsemide  20 every day - renvela  1600 ac tid - others: PPI, gaba, xanax  prn, insulin , reglan  5 qid prn    OP PD: [facility] Dr [place]  3 exchanges overnight, 3.5 hr dwells and 2800 cc volume Dry wt = 79kg    Assessment/ Plan: Intractable N/V - w/ hx of diab gastroparesis. Getting IV reglan  q 8, watch Qtc. Consider CT abd if not improving. Get cell ct and culture of PD fluid. ESRD - on CCPD. Plan PD nightly while here.  HTN - bp's on the high side. No edema on exam. Will use mix of 1.5% and 2.5% fluids tonight. Cont home BP meds.  Volume - euvolemic on exam. As above.  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 Follow.  Secondary hyperparathyroidism - cont binders w/ meals. CCa in range.      Myer Fret  MD [facility] 11/06/2023, 1:44 PM  Recent Labs  Lab 11/06/23 0945 11/06/23 1008  HGB 10.7* 9.9*  ALBUMIN  3.7  --   CALCIUM  8.6*  --   CREATININE 5.94* 6.40*  K 3.9 3.9   Inpatient medications:  gabapentin   200 mg Oral Daily   heparin   5,000 Units Subcutaneous Q8H   insulin  aspart  0-6 Units Subcutaneous TID WC   insulin  glargine-yfgn  5 Units Subcutaneous QHS   metoCLOPramide  (REGLAN ) injection  10 mg Intravenous Q8H    metoprolol  tartrate  50 mg Oral BID   pantoprazole   40 mg Oral Daily  sevelamer  carbonate  1,600 mg Oral TID with meals   tamsulosin   0.4 mg Oral QPC supper    sodium chloride      acetaminophen  **OR** acetaminophen , ALPRAZolam 

## 2023-11-06 NOTE — Procedures (Signed)
 I have reviewed the PD regimen and made appropriate changes.  Vinson Moselle MD  [facility] 11/06/2023, 3:51 PM

## 2023-11-06 NOTE — Plan of Care (Signed)
  Problem: Pain Management: Goal: General experience of comfort will improve Outcome: Progressing   Problem: Safety: Goal: Ability to remain free from injury will improve Outcome: Progressing

## 2023-11-07 DIAGNOSIS — R112 Nausea with vomiting, unspecified: Secondary | ICD-10-CM | POA: Diagnosis not present

## 2023-11-07 LAB — CBC
HCT: 27 % — ABNORMAL LOW (ref 39.0–52.0)
Hemoglobin: 9.7 g/dL — ABNORMAL LOW (ref 13.0–17.0)
MCH: 30.8 pg (ref 26.0–34.0)
MCHC: 35.9 g/dL (ref 30.0–36.0)
MCV: 85.7 fL (ref 80.0–100.0)
Platelets: 237 10*3/uL (ref 150–400)
RBC: 3.15 MIL/uL — ABNORMAL LOW (ref 4.22–5.81)
RDW: 11.7 % (ref 11.5–15.5)
WBC: 14.6 10*3/uL — ABNORMAL HIGH (ref 4.0–10.5)
nRBC: 0 % (ref 0.0–0.2)

## 2023-11-07 LAB — GLUCOSE, CAPILLARY
Glucose-Capillary: 163 mg/dL — ABNORMAL HIGH (ref 70–99)
Glucose-Capillary: 160 mg/dL — ABNORMAL HIGH (ref 70–99)
Glucose-Capillary: 170 mg/dL — ABNORMAL HIGH (ref 70–99)

## 2023-11-07 LAB — RENAL FUNCTION PANEL
Albumin: 3.2 g/dL — ABNORMAL LOW (ref 3.5–5.0)
Anion gap: 11 (ref 5–15)
BUN: 78 mg/dL — ABNORMAL HIGH (ref 6–20)
CO2: 26 mmol/L (ref 22–32)
Calcium: 8.3 mg/dL — ABNORMAL LOW (ref 8.9–10.3)
Chloride: 95 mmol/L — ABNORMAL LOW (ref 98–111)
Creatinine, Ser: 5.74 mg/dL — ABNORMAL HIGH (ref 0.61–1.24)
GFR, Estimated: 11 mL/min — ABNORMAL LOW (ref >60)
Glucose, Bld: 217 mg/dL — ABNORMAL HIGH (ref 70–99)
Phosphorus: 4.5 mg/dL (ref 2.5–4.6)
Potassium: 3.7 mmol/L (ref 3.5–5.1)
Sodium: 132 mmol/L — ABNORMAL LOW (ref 135–145)

## 2023-11-07 LAB — BODY FLUID CELL COUNT WITH DIFFERENTIAL
Eos, Fluid: 0 %
Lymphs, Fluid: 90 %
Monocyte-Macrophage-Serous Fluid: 10 % — ABNORMAL LOW (ref 50–90)
Neutrophil Count, Fluid: 0 % (ref 0–25)
Total Nucleated Cell Count, Fluid: 90 uL (ref 0–1000)

## 2023-11-07 MED ORDER — DELFLEX-LC/1.5% DEXTROSE 344 MOSM/L IP SOLN: INTRAPERITONEAL | Status: DC

## 2023-11-07 MED ORDER — GENTAMICIN SULFATE 0.1 % EX CREA: 1.0000 | TOPICAL_CREAM | Freq: Every day | CUTANEOUS | Status: DC

## 2023-11-07 MED ORDER — METOCLOPRAMIDE HCL 5 MG/ML IJ SOLN
10.0000 mg | Freq: Four times a day (QID) | INTRAMUSCULAR | Status: DC
  Administered 2023-11-07 – 2023-11-08 (×4): 10 mg via INTRAVENOUS
  Filled 2023-11-07 (×4): qty 2

## 2023-11-07 MED ORDER — CHLORPROMAZINE HCL 25 MG PO TABS
25.0000 mg | ORAL_TABLET | Freq: Three times a day (TID) | ORAL | Status: DC | PRN
  Filled 2023-11-07: qty 1

## 2023-11-07 NOTE — Progress Notes (Signed)
 Admit: 11/06/2023 LOS: 0  79M ESRD on PD with intractable N/V  Subjective:  Feels much improved today, tol some PO PD cycled overnight uneventful Cell Count not yet collected but d/w RN   01/12 0701 - 01/13 0700 In: 174 [P.O.:120; IV Piggyback:54] Out: -   Filed Weights   11/06/23 0933 11/07/23 0500 11/07/23 0600  Weight: 79.4 kg 85.1 kg 85 kg    Scheduled Meds:  gabapentin   200 mg Oral Daily   gentamicin  cream  1 Application Topical Daily   heparin   5,000 Units Subcutaneous Q8H   insulin  aspart  0-6 Units Subcutaneous TID WC   insulin  glargine-yfgn  5 Units Subcutaneous QHS   metoCLOPramide  (REGLAN ) injection  10 mg Intravenous Q6H   metoprolol  tartrate  50 mg Oral BID   pantoprazole   40 mg Oral Daily   sevelamer  carbonate  1,600 mg Oral TID with meals   tamsulosin   0.4 mg Oral QPC supper   Continuous Infusions:  dialysis solution 1.5% low-MG/low-CA     dialysis solution 2.5% low-MG/low-CA     ondansetron  (ZOFRAN ) IV Stopped (11/07/23 1120)   PRN Meds:.acetaminophen  **OR** acetaminophen , ALPRAZolam , chlorproMAZINE , ondansetron  (ZOFRAN ) IV  Current Labs: reviewed    Physical Exam:  Blood pressure (!) 143/87, pulse 82, temperature 98.4 F (36.9 C), temperature source Oral, resp. rate 17, height 5' 11 (1.803 m), weight 85 kg, SpO2 96%. NAD, wake alert Hoarse / quiet voice Quiet BS, s/nt/nd No rashes/lesions No LEE Nonfocal  Outpt RX: CAPD 2.8L x3  A ESRD on CAPD N/V, ? Gastroparesis, improved with reglan  HTN: BPs stable, seems euvolemic Anemia: Hb 9.7, usually higher no recenet ESA CTM for right now CKD-BMD: outpt PTH, Ca and P well controlled  P Cont PD on schedule; does CAPD has sig RRF, do CCPD 3L x3 for 3h each here. Cell count ordered for today Medication Issues; Preferred narcotic agents for pain control are hydromorphone , fentanyl , and methadone. Morphine  should not be used.  Baclofen  should be avoided Avoid oral sodium phosphate  and magnesium   citrate based laxatives / bowel preps    Bernardino Gasman MD 11/07/2023, 1:57 PM  Recent Labs  Lab 11/06/23 0945 11/06/23 1008 11/07/23 0502  NA 129* 130* 132*  K 3.9 3.9 3.7  CL 93* 94* 95*  CO2 21*  --  26  GLUCOSE 213* 214* 217*  BUN 79* 82* 78*  CREATININE 5.94* 6.40* 5.74*  CALCIUM  8.6*  --  8.3*  PHOS  --   --  4.5   Recent Labs  Lab 11/06/23 0945 11/06/23 1008 11/07/23 0502  WBC 21.5*  --  14.6*  NEUTROABS 17.9*  --   --   HGB 10.7* 9.9* 9.7*  HCT 30.0* 29.0* 27.0*  MCV 87.0  --  85.7  PLT 249  --  237

## 2023-11-07 NOTE — Progress Notes (Signed)
 PROGRESS NOTE    Jose [name]  FMW:[id number] DOB: 10-21-1976 DOA: 11/06/2023 PCP: Pridgen, Taylar, NP    Brief Narrative:  Jose [name] is a 48 y.o. male with medical history significant of ESRD on peritoneal dialysis, anemia of ESRD, type 2 diabetes with retinopathy and history of diabetic ulcer, diabetic gastroparesis presenting with intractable nausea and vomiting for the past 4 days.   Patient states that he suddenly began having nausea and vomiting about 4 days ago.  He has had prior history of nausea and vomiting several times in the past, thought to be due to diabetic gastroparesis.  His last admission was in September 2024 where he was hospitalized for likely gastroparesis flare with superimposed COVID infection.   Assessment and Plan: Intractable N/V History of diabetic gastroparesis Patient presenting with 4-day history of progressively worsening nausea and vomiting despite use of as needed Zofran  at home.  He does have a history of diabetic gastroparesis but reports nonadherence with home Reglan  as he ran out of it at some point in the past.  He does endorse associated decreased oral intake but otherwise denies any other signs or symptoms.  His abdominal exam is unrevealing.  Given absence of abdominal pain or other localizing symptoms on physical exam, will hold off on CT abdomen at this time.   -IV Reglan  10 mg q8h -follow Qtc -advance diet -f/u peritoneal fluid cell count and gram stain to rule out infection   Leukocytosis -trending down -await fluid from PD   Tachycardia -resolved   Chronic uncontrolled HTN Appears to have chronically uncontrolled HTN. He takes metoprolol  tartrate 75mg  daily, which is unusual given typical BID dosing. 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 A1c was  6.1% on 09/12/2023. Will start semglee  5u daily (given poor PO intake) and very sensitive SSI given ESRD status. His CBGs are above goal at this time. He has a history of diabetic gastroparesis, which is likely contributing to presenting signs/symptoms. -A1c 6.1% on 09/12/2023 -start semglee  5u daily  -very sensitive SSI -trend CBGs, goal 140-180 -Gabapentin  200 mg daily   ESRD on peritoneal dialysis -nephrology consulted and following, appreciate assistance -continue renvela  -f/u body fluid cell count and gram stain -Trend renal function panel   Anemia of ESRD -hemoglobin stable, follow   Generalized anxiety disorder Patient takes Xanax  0.25 mg twice daily as needed for anxiety.  PDMP reviewed and appropriate. -Continue Xanax  0.25 mg twice daily as needed   BPH -continue home flomax       DVT prophylaxis: heparin  injection 5,000 Units Start: 11/06/23 1400    Code Status: Full Code   Disposition Plan:  Level of care: Med-Surg     Consultants:  renal   Subjective: C/o hiccups  Objective: Vitals:   11/07/23 0411 11/07/23 0500 11/07/23 0600 11/07/23 0905  BP: 137/84   (!) 143/87  Pulse: 79   82  Resp: 17   17  Temp: 98.4 F (36.9 C)   98.4 F (36.9 C)  TempSrc: Oral   Oral  SpO2: 94%   96%  Weight:  85.1 kg 85 kg   Height:        Intake/Output Summary (Last 24 hours) at 11/07/2023 1133 Last data filed at 11/07/2023 1057 Gross per 24 hour  Intake 174 ml  Output --  Net 174 ml   [employer]  11/06/23 0933 11/07/23 0500 11/07/23 0600  Weight: 79.4 kg 85.1 kg 85 kg    Examination:   General: Appearance:     Overweight male in no acute distress     Lungs:     respirations unlabored  Heart:    Normal heart rate.    MS:   All extremities are intact.    Neurologic:   Awake, alert       Data Reviewed: I have personally reviewed following labs and imaging studies  CBC: Recent Labs  Lab 11/06/23 0945 11/06/23 1008 11/07/23 0502  WBC  21.5*  --  14.6*  NEUTROABS 17.9*  --   --   HGB 10.7* 9.9* 9.7*  HCT 30.0* 29.0* 27.0*  MCV 87.0  --  85.7  PLT 249  --  237   Basic Metabolic Panel: Recent Labs  Lab 11/06/23 0945 11/06/23 1008 11/07/23 0502  NA 129* 130* 132*  K 3.9 3.9 3.7  CL 93* 94* 95*  CO2 21*  --  26  GLUCOSE 213* 214* 217*  BUN 79* 82* 78*  CREATININE 5.94* 6.40* 5.74*  CALCIUM  8.6*  --  8.3*  PHOS  --   --  4.5   GFR: Estimated Creatinine Clearance: 16.6 mL/min (A) (by C-G formula based on SCr of 5.74 mg/dL (H)). Liver Function Tests: Recent Labs  Lab 11/06/23 0945 11/07/23 0502  AST 14*  --   ALT 12  --   ALKPHOS 73  --   BILITOT 1.2  --   PROT 6.6  --   ALBUMIN  3.7 3.2*   Recent Labs  Lab 11/06/23 0945  LIPASE 84*   No results for input(s): AMMONIA in the last 168 hours. Coagulation Profile: No results for input(s): INR, PROTIME in the last 168 hours. Cardiac Enzymes: No results for input(s): CKTOTAL, CKMB, CKMBINDEX, TROPONINI in the last 168 hours. BNP (last 3 results) No results for input(s): PROBNP in the last 8760 hours. HbA1C: No results for input(s): HGBA1C in the last 72 hours. CBG: Recent Labs  Lab 11/06/23 1725 11/06/23 2010  GLUCAP 151* 156*   Lipid Profile: No results for input(s): CHOL, HDL, LDLCALC, TRIG, CHOLHDL, LDLDIRECT in the last 72 hours. Thyroid  Function Tests: No results for input(s): TSH, T4TOTAL, FREET4, T3FREE, THYROIDAB in the last 72 hours. Anemia Panel: No results for input(s): VITAMINB12, FOLATE, FERRITIN, TIBC, IRON , RETICCTPCT in the last 72 hours. Sepsis Labs: Recent Labs  Lab 11/06/23 1009  LATICACIDVEN 1.1    No results found for this or any previous visit (from the past 240 hours).       Radiology Studies: No results found.      Scheduled Meds:  gabapentin   200 mg Oral Daily   gentamicin  cream  1 Application Topical Daily   heparin   5,000 Units Subcutaneous Q8H    insulin  aspart  0-6 Units Subcutaneous TID WC   insulin  glargine-yfgn  5 Units Subcutaneous QHS   metoCLOPramide  (REGLAN ) injection  10 mg Intravenous Q6H   metoprolol  tartrate  50 mg Oral BID   pantoprazole   40 mg Oral Daily   sevelamer  carbonate  1,600 mg Oral TID with meals   tamsulosin   0.4 mg Oral QPC supper   Continuous Infusions:  dialysis solution 1.5% low-MG/low-CA     dialysis solution 2.5% low-MG/low-CA     ondansetron  (ZOFRAN ) IV 8 mg (11/07/23 1105)     LOS: 0 days    Time spent: 45 minutes spent on chart review, discussion with nursing staff,  consultants, updating family and interview/physical exam; more than 50% of that time was spent in counseling and/or coordination of care.    Harlene RAYMOND Bowl, DO [facility] Available via Epic secure chat 7am-7pm After these hours, please refer to coverage provider listed on [email or web address] 11/07/2023, 11:33 AM

## 2023-11-07 NOTE — Progress Notes (Signed)
 Pts PD tx was complete and lines was deaccessed at approx 1550---peritonal fluid sample drawn and sent for cell count and gram stain sent to lab---sterile technique maintained-see flowsheet for any further information

## 2023-11-07 NOTE — Progress Notes (Signed)
   11/07/23 1429  TOC Brief Assessment  Insurance and Status Reviewed  Patient has primary care physician Yes  Home environment has been reviewed spouse  Prior level of function: independent  Prior/Current Home Services No current home services  Social Drivers of Health Review SDOH reviewed no interventions necessary  Readmission risk has been reviewed Yes  Transition of care needs no transition of care needs at this time     Transition of Care Department [facility]) has reviewed patient and no TOC needs have been identified at this time. We will continue to monitor patient advancement through interdisciplinary progression rounds. If new patient transition needs arise, please place a TOC consult.

## 2023-11-07 NOTE — Plan of Care (Signed)
  Problem: Pain Management: Goal: General experience of comfort will improve Outcome: Progressing   Problem: Safety: Goal: Ability to remain free from injury will improve Outcome: Progressing

## 2023-11-08 ENCOUNTER — Other Ambulatory Visit (HOSPITAL_COMMUNITY): Payer: Self-pay

## 2023-11-08 DIAGNOSIS — D631 Anemia in chronic kidney disease: Secondary | ICD-10-CM | POA: Diagnosis not present

## 2023-11-08 DIAGNOSIS — N186 End stage renal disease: Secondary | ICD-10-CM | POA: Diagnosis not present

## 2023-11-08 DIAGNOSIS — Z992 Dependence on renal dialysis: Secondary | ICD-10-CM | POA: Diagnosis not present

## 2023-11-08 DIAGNOSIS — R112 Nausea with vomiting, unspecified: Secondary | ICD-10-CM | POA: Diagnosis not present

## 2023-11-08 LAB — CBC
HCT: 26.1 % — ABNORMAL LOW (ref 39.0–52.0)
Hemoglobin: 9.7 g/dL — ABNORMAL LOW (ref 13.0–17.0)
MCH: 31 pg (ref 26.0–34.0)
MCHC: 37.2 g/dL — ABNORMAL HIGH (ref 30.0–36.0)
MCV: 83.4 fL (ref 80.0–100.0)
Platelets: 222 10*3/uL (ref 150–400)
RBC: 3.13 MIL/uL — ABNORMAL LOW (ref 4.22–5.81)
RDW: 11.5 % (ref 11.5–15.5)
WBC: 10.3 10*3/uL (ref 4.0–10.5)
nRBC: 0 % (ref 0.0–0.2)

## 2023-11-08 LAB — BASIC METABOLIC PANEL
Anion gap: 13 (ref 5–15)
BUN: 70 mg/dL — ABNORMAL HIGH (ref 6–20)
CO2: 26 mmol/L (ref 22–32)
Calcium: 8 mg/dL — ABNORMAL LOW (ref 8.9–10.3)
Chloride: 94 mmol/L — ABNORMAL LOW (ref 98–111)
Creatinine, Ser: 5.38 mg/dL — ABNORMAL HIGH (ref 0.61–1.24)
GFR, Estimated: 12 mL/min — ABNORMAL LOW (ref >60)
Glucose, Bld: 179 mg/dL — ABNORMAL HIGH (ref 70–99)
Potassium: 3.4 mmol/L — ABNORMAL LOW (ref 3.5–5.1)
Sodium: 133 mmol/L — ABNORMAL LOW (ref 135–145)

## 2023-11-08 LAB — GLUCOSE, CAPILLARY
Glucose-Capillary: 216 mg/dL — ABNORMAL HIGH (ref 70–99)
Glucose-Capillary: 157 mg/dL — ABNORMAL HIGH (ref 70–99)

## 2023-11-08 MED ORDER — METOCLOPRAMIDE HCL 10 MG PO TABS
10.0000 mg | ORAL_TABLET | Freq: Three times a day (TID) | ORAL | 1 refills | Status: AC
  Filled 2023-11-08: qty 90, 30d supply, fill #0

## 2023-11-08 MED ORDER — METOCLOPRAMIDE HCL 5 MG PO TABS
10.0000 mg | ORAL_TABLET | Freq: Three times a day (TID) | ORAL | Status: DC
  Administered 2023-11-08: 10 mg via ORAL
  Filled 2023-11-08: qty 2

## 2023-11-08 NOTE — Progress Notes (Signed)
 TOC Pharmacy medication delivered to pt. Dialysis RN in pt's room to change PD cath to home cath use. Pt's wife is here at the main entrance and pt will be taken down momentarily for discharge.  Justice Milliron,RN SWOT

## 2023-11-08 NOTE — Progress Notes (Signed)
 Late entry At approx 0835--I deaccessed this pt PD catheter---sterile technique itilized--no additional information at this time

## 2023-11-08 NOTE — Discharge Summary (Signed)
 Physician Discharge Summary  Jose [name] FMW:[id number] DOB: 18-Feb-1976 DOA: 11/06/2023  PCP: Pridgen, Taylar, NP  Admit date: 11/06/2023 Discharge date: 11/08/2023  Admitted From: home Discharge disposition: home   Recommendations for Outpatient Follow-Up:   Gastroparesis diet-- increased reglan -- follow EKG   Discharge Diagnosis:   Principal Problem:   Intractable nausea and vomiting Active Problems:   ESRD (end stage renal disease) on dialysis (HCC)   Anemia of chronic disease   GAD (generalized anxiety disorder)   Essential hypertension   Diabetic gastroparesis associated with type 2 diabetes mellitus (HCC)    Discharge Condition: Improved.  Diet recommendation: renal  Wound care: None.  Code status: Full.   History of Present Illness:   Jose [name] is a 48 y.o. male with medical history significant of ESRD on peritoneal dialysis, anemia of ESRD, type 2 diabetes with retinopathy and history of diabetic ulcer, diabetic gastroparesis presenting with intractable nausea and vomiting for the past 4 days.   Patient states that he suddenly began having nausea and vomiting about 4 days ago.  He has had prior history of nausea and vomiting several times in the past, thought to be due to diabetic gastroparesis.  His last admission was in September 2024 where he was hospitalized for likely gastroparesis flare with superimposed COVID infection.   He states that he was in his usual state of health until about 4 days ago.  He began experiencing nausea and vomiting after having lunch and it progressively worsened over the past 4 days.  He has tried as needed Zofran  at home every 4 hours for the past 4 days without any improvement in his symptoms.  He does report running out of Reglan  at some point in the past and thus has not been adherent with this medication.  He denies any fevers, chills, chest pain, shortness of breath, abdominal pain, diarrhea.  He states that his  vomiting has been nonbilious and nonbloody, though does report 1 episode of emesis last night that was slightly blood-tinged.  Vomiting today has been without any blood.  He does also report decreased p.o. intake over the past 4 days, has been able to drink small sips of water  or ginger ale but has not been able to eat anything over this time.  Presented to the ED as his symptoms were not abating despite oral medications.   He does report taking humulin  70/30 insulin  at 8u BID along with sliding scale insulin  at home. Has not taken insulin  since yesterday given poor PO intake. He also reports compliance with home PD, last performed yesterday. States he has not experienced any difficulties with this and denies any abdominal pain.   ED course: Vital signs stable, blood pressure elevated to the 180s/90s. CBC with WBC 21.5, stable hemoglobin. CMP with mild hyponatremia, glucose 213, mild metabolic acidosis. Lipase 84 (elevated in past). Lactate normal. CXR unremarkable. TRH asked to evaluate patient for admission given N/V unimproved with oral antiemetics.      Hospital Course by Problem:   Intractable N/V History of diabetic gastroparesis Patient presenting with 4-day history of progressively worsening nausea and vomiting despite use of as needed Zofran  at home.  He does have a history of diabetic gastroparesis but reports nonadherence with home Reglan  as he ran out of it at some point in the past.  He does endorse associated decreased oral intake but otherwise denies any other signs or symptoms.  His abdominal exam is unrevealing.  Given absence  of abdominal pain or other localizing symptoms on physical exam, will hold off on CT abdomen at this time.   -IV Reglan  10 mg q8h-- change to PO - Qtc normal -advance diet    Leukocytosis -trending down== so suspect reactive   Tachycardia -resolved    uncontrolled HTN -suspect related to gastroparesis     Type 2 diabetes with retinopathy and  neuropathy Diabetic gastroparesis Takes humulin  70/30 insulin  8u BID at home along with sliding scale insulin . A1c was 6.1% on 09/12/2023. Will start semglee  5u daily (given poor PO intake) and very sensitive SSI given ESRD status. His CBGs are above goal at this time. He has a history of diabetic gastroparesis, which is likely contributing to presenting signs/symptoms. -A1c 6.1% on 09/12/2023 -resume home meds-- adjust outpatient -Gabapentin  200 mg daily   ESRD on peritoneal dialysis -nephrology consulted and following, appreciate assistance -continue renvela    Anemia of ESRD -hemoglobin stable, follow outpatient   Generalized anxiety disorder Patient takes Xanax  0.25 mg twice daily as needed for anxiety.  PDMP reviewed and appropriate. -Continue Xanax  0.25 mg twice daily as needed   BPH -continue home flomax     Medical Consultants:   renal   Discharge Exam:   Vitals:   11/08/23 0458 11/08/23 0916  BP: 135/85 136/75  Pulse: 72 76  Resp: 18 17  Temp: 98.2 F (36.8 C) 98.3 F (36.8 C)  SpO2: 95% 97%   Vitals:   11/07/23 1958 11/08/23 0458 11/08/23 0500 11/08/23 0916  BP: 127/78 135/85  136/75  Pulse: 85 72  76  Resp: 17 18  17   Temp: 98.8 F (37.1 C) 98.2 F (36.8 C)  98.3 F (36.8 C)  TempSrc: Oral   Oral  SpO2: 94% 95%  97%  Weight:   84.3 kg   Height:        General exam: Appears calm and comfortable.    The results of significant diagnostics from this hospitalization (including imaging, microbiology, ancillary and laboratory) are listed below for reference.     Procedures and Diagnostic Studies:   No results found.   Labs:   Basic Metabolic Panel: Recent Labs  Lab 11/06/23 0945 11/06/23 1008 11/07/23 0502 11/08/23 0610  NA 129* 130* 132* 133*  K 3.9 3.9 3.7 3.4*  CL 93* 94* 95* 94*  CO2 21*  --  26 26  GLUCOSE 213* 214* 217* 179*  BUN 79* 82* 78* 70*  CREATININE 5.94* 6.40* 5.74* 5.38*  CALCIUM  8.6*  --  8.3* 8.0*  PHOS  --   --   4.5  --    GFR Estimated Creatinine Clearance: 17.7 mL/min (A) (by C-G formula based on SCr of 5.38 mg/dL (H)). Liver Function Tests: Recent Labs  Lab 11/06/23 0945 11/07/23 0502  AST 14*  --   ALT 12  --   ALKPHOS 73  --   BILITOT 1.2  --   PROT 6.6  --   ALBUMIN  3.7 3.2*   Recent Labs  Lab 11/06/23 0945  LIPASE 84*   No results for input(s): AMMONIA in the last 168 hours. Coagulation profile No results for input(s): INR, PROTIME in the last 168 hours.  CBC: Recent Labs  Lab 11/06/23 0945 11/06/23 1008 11/07/23 0502 11/08/23 0610  WBC 21.5*  --  14.6* 10.3  NEUTROABS 17.9*  --   --   --   HGB 10.7* 9.9* 9.7* 9.7*  HCT 30.0* 29.0* 27.0* 26.1*  MCV 87.0  --  85.7 83.4  PLT  249  --  237 222   Cardiac Enzymes: No results for input(s): CKTOTAL, CKMB, CKMBINDEX, TROPONINI in the last 168 hours. BNP: Invalid input(s): POCBNP CBG: Recent Labs  Lab 11/06/23 2010 11/07/23 1151 11/07/23 1648 11/07/23 2000 11/08/23 0855  GLUCAP 156* 163* 160* 170* 216*   D-Dimer No results for input(s): DDIMER in the last 72 hours. Hgb A1c No results for input(s): HGBA1C in the last 72 hours. Lipid Profile No results for input(s): CHOL, HDL, LDLCALC, TRIG, CHOLHDL, LDLDIRECT in the last 72 hours. Thyroid  function studies No results for input(s): TSH, T4TOTAL, T3FREE, THYROIDAB in the last 72 hours.  Invalid input(s): FREET3 Anemia work up No results for input(s): VITAMINB12, FOLATE, FERRITIN, TIBC, IRON , RETICCTPCT in the last 72 hours. Microbiology Recent Results (from the past 240 hours)  Body fluid culture w Gram Stain     Status: None (Preliminary result)   Collection Time: 11/06/23 12:36 PM   Specimen: Peritoneal Washings; Body Fluid  Result Value Ref Range Status   Specimen Description PERITONEAL  Final   Special Requests NONE  Final   Gram Stain NO WBC SEEN NO ORGANISMS SEEN CYTOSPIN SMEAR   Final   Culture    Final    NO GROWTH < 24 HOURS Performed at [facility], [address], [place], [place] [address]    Report Status PENDING  Incomplete     Discharge Instructions:   Discharge Instructions     Discharge instructions   Complete by: As directed    Small frequent meals   Increase activity slowly   Complete by: As directed    No wound care   Complete by: As directed       Allergies as of 11/08/2023       Reactions   Tirzepatide  Rash   Cranberry Itching   Hm Lidocaine  Patch [lidocaine ] Dermatitis   Blisters skin    Melatonin Other (See Comments)   nightmares   Nsaids Other (See Comments)   Stage 3 kidney disease   Trulicity  [dulaglutide ] Other (See Comments)   Night sweats  Uncontrolled tempeture         Medication List     TAKE these medications    acetaminophen  500 MG tablet Commonly known as: TYLENOL  Take 500-1,000 mg by mouth every 6 (six) hours as needed for mild pain.   albuterol  108 (90 Base) MCG/ACT inhaler Commonly known as: VENTOLIN  HFA Inhale 1 puff into the lungs every 4 (four) hours as needed for wheezing or shortness of breath.   ALPRAZolam  0.25 MG tablet Commonly known as: XANAX  Take 0.25 mg by mouth 2 (two) times daily as needed for anxiety.   BD Pen Needle Nano 2nd Gen 32G X 4 MM Misc Generic drug: Insulin  Pen Needle USE AS DIRECTED FOUR TIMES A DAY.   cholecalciferol  25 MCG (1000 UNIT) tablet Commonly known as: VITAMIN D3 Take 1,000 Units by mouth daily.   FreeStyle Libre 3 Sensor Misc 1 Device by Does not apply route every 14 (fourteen) days. Apply 1 sensor on upper arm every 14 days for continuous glucose monitoring   FreeStyle Libre 3 Plus Sensor Misc Change sensor every 15 days.   gabapentin  100 MG capsule Commonly known as: NEURONTIN  Take 2 capsules (200 mg total) by mouth 2 (two) times daily.   HumuLIN  70/30 KwikPen (70-30) 100 UNIT/ML KwikPen Generic drug: insulin  isophane & regular human KwikPen Inject 8 Units  into the skin daily. What changed: Another medication with the same name was  removed. Continue taking this medication, and follow the directions you see here.   insulin  lispro 100 UNIT/ML KwikPen Commonly known as: HumaLOG  KwikPen Inject 10 Units into the skin 3 (three) times daily. What changed:  how much to take when to take this reasons to take this   metoCLOPramide  10 MG tablet Commonly known as: REGLAN  Take 1 tablet (10 mg total) by mouth 3 (three) times daily before meals. What changed:  medication strength how much to take when to take this   Metoprolol  Tartrate 75 MG Tabs Take 1 tablet (75 mg total) by mouth daily.   ondansetron  24 MG tablet Commonly known as: ZOFRAN  Take 24 mg by mouth daily as needed for nausea.   pantoprazole  40 MG tablet Commonly known as: PROTONIX  Take 40 mg by mouth daily.   polyethylene glycol 17 g packet Commonly known as: MIRALAX  / GLYCOLAX  Take 17 g by mouth daily as needed for moderate constipation, mild constipation or severe constipation.   sevelamer  carbonate 800 MG tablet Commonly known as: RENVELA  Take 1,600 mg by mouth 3 (three) times daily.   tamsulosin  0.4 MG Caps capsule Commonly known as: FLOMAX  Take 1 capsule (0.4 mg total) by mouth daily after supper.   torsemide  20 MG tablet Commonly known as: DEMADEX  Take 20 mg by mouth daily.   traZODone  100 MG tablet Commonly known as: DESYREL  Take 100 mg by mouth at bedtime.        Follow-up Information     Pridgen, Taylar, NP Follow up.   Specialty: Family Medicine Contact information: [address] [place] [address] [phone]                  Time coordinating discharge: 45 min  Signed:  Harlene RAYMOND Bowl DO  [facility] 11/08/2023, 12:00 PM

## 2023-11-08 NOTE — Progress Notes (Addendum)
 At 1256   Pt discharging home, pt states he has to have his PD catheter changed to a Freesinus catheter before he can go home.  Dialysis unit notified, spoke with Verneita and she will be up shortly to take care of this.    At 1250, pt's IV d/c'd. Discharge instructions reviewed with pt. Hand out on gastroparesis included in d/c instructions. Copy of instructions given to pt. [facility] Pharmacy is filling one script (refill if eligible for refill, pt states he does have some of the medication at home) and will be picked up. Pt has contacted his wife for his ride home. Pt waiting for his PD catheter to be changed for home (see above note).   Davette Nugent,RN SWOT

## 2023-11-08 NOTE — Progress Notes (Signed)
  [facility] Progress Note   Subjective: Completed dialysis overnight. No issues. Feels much better today. Tolerating PO. No abd pain,  nausea/vomiting. PD fluid cell count <100.   Objective Vitals:   11/07/23 1958 11/08/23 0458 11/08/23 0500 11/08/23 0916  BP: 127/78 135/85  136/75  Pulse: 85 72  76  Resp: 17 18  17   Temp: 98.8 F (37.1 C) 98.2 F (36.8 C)  98.3 F (36.8 C)  TempSrc: Oral   Oral  SpO2: 94% 95%  97%  Weight:   84.3 kg   Height:         Additional Objective Labs: Basic Metabolic Panel: Recent Labs  Lab 11/06/23 0945 11/06/23 1008 11/07/23 0502 11/08/23 0610  NA 129* 130* 132* 133*  K 3.9 3.9 3.7 3.4*  CL 93* 94* 95* 94*  CO2 21*  --  26 26  GLUCOSE 213* 214* 217* 179*  BUN 79* 82* 78* 70*  CREATININE 5.94* 6.40* 5.74* 5.38*  CALCIUM  8.6*  --  8.3* 8.0*  PHOS  --   --  4.5  --    CBC: Recent Labs  Lab 11/06/23 0945 11/06/23 1008 11/07/23 0502 11/08/23 0610  WBC 21.5*  --  14.6* 10.3  NEUTROABS 17.9*  --   --   --   HGB 10.7* 9.9* 9.7* 9.7*  HCT 30.0* 29.0* 27.0* 26.1*  MCV 87.0  --  85.7 83.4  PLT 249  --  237 222   Blood Culture    Component Value Date/Time   SDES PERITONEAL 11/06/2023 1236   SPECREQUEST NONE 11/06/2023 1236   CULT  11/06/2023 1236    NO GROWTH < 24 HOURS Performed at [facility], [address] [place], [place] [address]    REPTSTATUS PENDING 11/06/2023 1236     Physical Exam General: Lying in bed, nad Heart: RRR Lungs: Clear bilaterally  Abdomen: soft non-tender  Extremities: No LE edema  Dialysis Access: PD cath   Medications:  dialysis solution 1.5% low-MG/low-CA     dialysis solution 1.5% low-MG/low-CA     dialysis solution 2.5% low-MG/low-CA     ondansetron  (ZOFRAN ) IV 8 mg (11/07/23 2358)    gabapentin   200 mg Oral Daily   gentamicin  cream  1 Application Topical Daily   heparin   5,000 Units Subcutaneous Q8H   insulin  aspart  0-6 Units Subcutaneous TID WC   insulin   glargine-yfgn  5 Units Subcutaneous QHS   metoCLOPramide   10 mg Oral TID AC   metoprolol  tartrate  50 mg Oral BID   pantoprazole   40 mg Oral Daily   sevelamer  carbonate  1,600 mg Oral TID with meals   tamsulosin   0.4 mg Oral QPC supper    Outpt RX: CAPD 2.8L x3   Assessment/Plan  ESRD on CAPD. Cell count <100.  N/V, ? Gastroparesis, improved with reglan . Unlikely peritonitis.  HTN: BPs stable, seems euvolemic Anemia: Hb 9.7, usually higher no recenet ESA CTM for right now CKD-BMD: outpt PTH, Ca and P well controlled Dispo: Ok for discharge from renal standpoint      Placido Hangartner Ronnald Acosta PA-C [facility] 11/08/2023,10:35 AM

## 2023-11-11 LAB — BODY FLUID CULTURE W GRAM STAIN
Culture: NO GROWTH
Gram Stain: NONE SEEN

## 2023-12-15 ENCOUNTER — Ambulatory Visit: Payer: 59 | Admitting: "Endocrinology

## 2023-12-26 ENCOUNTER — Encounter: Payer: Self-pay | Admitting: "Endocrinology

## 2023-12-26 ENCOUNTER — Ambulatory Visit (INDEPENDENT_AMBULATORY_CARE_PROVIDER_SITE_OTHER): Payer: 59 | Admitting: "Endocrinology

## 2023-12-26 VITALS — BP 160/90 | HR 75 | Ht 71.0 in | Wt 166.0 lb

## 2023-12-26 DIAGNOSIS — E1122 Type 2 diabetes mellitus with diabetic chronic kidney disease: Secondary | ICD-10-CM | POA: Diagnosis not present

## 2023-12-26 DIAGNOSIS — Z794 Long term (current) use of insulin: Secondary | ICD-10-CM

## 2023-12-26 DIAGNOSIS — N186 End stage renal disease: Secondary | ICD-10-CM

## 2023-12-26 DIAGNOSIS — Z992 Dependence on renal dialysis: Secondary | ICD-10-CM | POA: Diagnosis not present

## 2023-12-26 LAB — POCT GLYCOSYLATED HEMOGLOBIN (HGB A1C): Hemoglobin A1C: 6.2 % — AB (ref 4.0–5.6)

## 2023-12-26 MED ORDER — PEN NEEDLES 32G X 4 MM MISC: 1.0000 | Freq: Four times a day (QID) | 2 refills | Status: AC

## 2023-12-26 NOTE — Progress Notes (Signed)
 Outpatient Endocrinology Note Jose Bancroft, MD  12/26/23   Jose [name] Nov 27, 1975 [id number]  Referring Provider: Inez Pilgrim, NP Primary Care Provider: Inez Pilgrim, NP Reason for consultation: Subjective   Assessment & Plan  Diagnoses and all orders for this visit:  Controlled type 2 diabetes mellitus with chronic kidney disease on chronic dialysis, with long-term current use of insulin (HCC) -     POCT glycosylated hemoglobin (Hb A1C)  Other orders -     Insulin Pen Needle (PEN NEEDLES) 32G X 4 MM MISC; 1 Device by Does not apply route in the morning, at noon, in the evening, and at bedtime.    Diabetes Type II complicated by 6.8, No results found for: "GFR" Hba1c goal less than 7, current Hba1c is  Lab Results  Component Value Date   HGBA1C 6.2 (A) 12/26/2023   Will recommend the following: Humulin 70/30 insulin: 8 units of insulin qam Humalog insulin 4-6 units with meals-15 min before meals  Target blood sugar is 120-150 two hours after meals  No known contraindications to any of above medications  -Last LD and Tg are as follows: Lab Results  Component Value Date   LDLCALC 57 11/22/2019    Lab Results  Component Value Date   TRIG 174 (H) 08/17/2021   -not on statin since on peritoneal dialysis  -Follow low fat diet and exercise   -On peritoneal dialysis due to ESRD due to diabetes  Reviewed and counseled on: -A1C target -Blood sugar targets -Complications of uncontrolled diabetes  -Checking blood sugar before meals and bedtime and bring log next visit -All medications with mechanism of action and side effects -Hypoglycemia management: rule of 15's, Glucagon Emergency Kit and medical alert ID -low-carb low-fat plate-method diet -At least 20 minutes of physical activity per day -Annual dilated retinal eye exam and foot exam -compliance and follow up needs -follow up as scheduled or earlier if problem gets worse  Call if blood sugar  is less than 70 or consistently above 250    Take a 15 gm snack of carbohydrate at bedtime before you go to sleep if your blood sugar is less than 100.    If you are going to fast after midnight for a test or procedure, ask your physician for instructions on how to reduce/decrease your insulin dose.    Call if blood sugar is less than 70 or consistently above 250  -Treating a low sugar by rule of 15  (15 gms of sugar every 15 min until sugar is more than 70) If you feel your sugar is low, test your sugar to be sure If your sugar is low (less than 70), then take 15 grams of a fast acting Carbohydrate (3-4 glucose tablets or glucose gel or 4 ounces of juice or regular soda) Recheck your sugar 15 min after treating low to make sure it is more than 70 If sugar is still less than 70, treat again with 15 grams of carbohydrate          Don't drive the hour of hypoglycemia  If unconscious/unable to eat or drink by mouth, use glucagon injection or nasal spray baqsimi and call 911. Can repeat again in 15 min if still unconscious.  Return in about 3 months (around 03/27/2024).   I have reviewed current medications, nurse's notes, allergies, vital signs, past medical and surgical history, family medical history, and social history for this encounter. Counseled patient on symptoms, examination findings, lab findings, imaging results,  treatment decisions and monitoring and prognosis. The patient understood the recommendations and agrees with the treatment plan. All questions regarding treatment plan were fully answered.  Jose Belvedere, MD  12/26/23    History of Present Illness Jose [name] is a 48 y.o. year old male who presents for follow up of Type II diabetes mellitus.  Jose [name] was first diagnosed at age 48.   Diabetes education +  Home diabetes regimen: Humulin 70/30 8 units of insulin qam Humalog insulin 2-3 units with meals (upto 6 units for dinner)-15 min before meals  Target  blood sugar is 120-150 two hours after meals    Previous history: He was initially put on medications at the time of diagnosis but only in his early 50s Non-insulin hypoglycemic drugs previously used: Trulicity, Byetta, Amaryl, metformin, Tradjenta, Farxiga and Invokana Insulin was started in 2022 He has previously been taking Levemir and Lantus insulin as well as NovoLog Side effects from medications: Trulicity caused nausea and tremor  COMPLICATIONS -  MI/Stroke +  retinopathy +  neuropathy +  nephropathy, on peritoneal dialysis   BLOOD SUGAR DATA     CGM interpretation: At today's visit, we reviewed her CGM downloads. The full report is scanned in the media. Reviewing the CGM trends, BG are well controlled across the day except lows overnight rarely.  Physical Exam  BP (!) 160/90 (BP Location: Right Arm, Patient Position: Sitting)   Pulse 75   Ht 5\' 11"  (1.803 m)   Wt 166 lb (75.3 kg)   SpO2 95%   BMI 23.15 kg/m    Constitutional: well developed, well nourished Head: normocephalic, atraumatic Eyes: sclera anicteric, no redness Neck: supple Lungs: normal respiratory effort Neurology: alert and oriented Skin: dry, no appreciable rashes Musculoskeletal: no appreciable defects Psychiatric: normal mood and affect Diabetic Foot Exam - Simple   No data filed      Current Medications Patient's Medications  New Prescriptions   INSULIN PEN NEEDLE (PEN NEEDLES) 32G X 4 MM MISC    1 Device by Does not apply route in the morning, at noon, in the evening, and at bedtime.  Previous Medications   ACETAMINOPHEN (TYLENOL) 500 MG TABLET    Take 500-1,000 mg by mouth every 6 (six) hours as needed for mild pain.   ALBUTEROL (VENTOLIN HFA) 108 (90 BASE) MCG/ACT INHALER    Inhale 1 puff into the lungs every 4 (four) hours as needed for wheezing or shortness of breath.   ALPRAZOLAM (XANAX) 0.25 MG TABLET    Take 0.25 mg by mouth 2 (two) times daily as needed for anxiety.    CHOLECALCIFEROL (VITAMIN D3) 25 MCG (1000 UNIT) TABLET    Take 1,000 Units by mouth daily.   CONTINUOUS BLOOD GLUC SENSOR (FREESTYLE LIBRE 3 SENSOR) MISC    1 Device by Does not apply route every 14 (fourteen) days. Apply 1 sensor on upper arm every 14 days for continuous glucose monitoring   CONTINUOUS GLUCOSE SENSOR (FREESTYLE LIBRE 3 PLUS SENSOR) MISC    Change sensor every 15 days.   GABAPENTIN (NEURONTIN) 100 MG CAPSULE    Take 2 capsules (200 mg total) by mouth 2 (two) times daily.   HUMULIN 70/30 KWIKPEN (70-30) 100 UNIT/ML KWIKPEN    Inject 8 Units into the skin daily.   INSULIN LISPRO (HUMALOG KWIKPEN) 100 UNIT/ML KWIKPEN    Inject 10 Units into the skin 3 (three) times daily.   INSULIN PEN NEEDLE (BD PEN NEEDLE NANO 2ND GEN) 32G X  4 MM MISC    USE AS DIRECTED FOUR TIMES A DAY.   METOCLOPRAMIDE (REGLAN) 10 MG TABLET    Take 1 tablet (10 mg total) by mouth 3 (three) times daily before meals.   METOPROLOL TARTRATE 75 MG TABS    Take 1 tablet (75 mg total) by mouth daily.   ONDANSETRON (ZOFRAN) 24 MG TABLET    Take 24 mg by mouth daily as needed for nausea.   PANTOPRAZOLE (PROTONIX) 40 MG TABLET    Take 40 mg by mouth daily.   POLYETHYLENE GLYCOL (MIRALAX / GLYCOLAX) 17 G PACKET    Take 17 g by mouth daily as needed for moderate constipation, mild constipation or severe constipation.   SEVELAMER CARBONATE (RENVELA) 800 MG TABLET    Take 1,600 mg by mouth 3 (three) times daily.   TAMSULOSIN (FLOMAX) 0.4 MG CAPS CAPSULE    Take 1 capsule (0.4 mg total) by mouth daily after supper.   TORSEMIDE (DEMADEX) 20 MG TABLET    Take 40 mg by mouth daily.   TRAZODONE (DESYREL) 100 MG TABLET    Take 100 mg by mouth at bedtime.  Modified Medications   No medications on file  Discontinued Medications   No medications on file    Allergies Allergies  Allergen Reactions   Tirzepatide Rash   Cranberry Itching   Hm Lidocaine Patch [Lidocaine] Dermatitis    Blisters skin    Melatonin Other (See  Comments)    nightmares   Nsaids Other (See Comments)    Stage 3 kidney disease   Trulicity [Dulaglutide] Other (See Comments)    Night sweats  Uncontrolled tempeture     Past Medical History Past Medical History:  Diagnosis Date   Anemia    ESRD on hemodialysis (HCC)    started in Oct 2023, HD at [facility]   Family history of adverse reaction to anesthesia    daughter age 43 months , coded after recieving 1 injection  for surgery - an inhalation med and morphine   GAD (generalized anxiety disorder)    Gastroparesis    Headache    Hyperlipidemia    Hypertension    Macular degeneration, bilateral    Pneumonia    Retinopathy    Type II diabetes mellitus with complication, uncontrolled    retinopathy, neuropathy, microalbuminuria    Past Surgical History Past Surgical History:  Procedure Laterality Date   APPENDECTOMY     AV FISTULA PLACEMENT Left 10/13/2022   Procedure: LEFT ARM ARTERIOVENOUS (AV) GRAFT CREATION;  Surgeon: Leonie Douglas, MD;  Location: [facility] OR;  Service: Vascular;  Laterality: Left;   BIOPSY  08/10/2021   Procedure: BIOPSY;  Surgeon: Kathi Der, MD;  Location: [facility] ENDOSCOPY;  Service: Gastroenterology;;   BUBBLE STUDY  07/29/2021   Procedure: BUBBLE STUDY;  Surgeon: Jake Bathe, MD;  Location: [facility] END[facility];  Service: Cardiovascular;;   CAPD INSERTION N/A 08/25/2022   Procedure: LAPAROSCOPIC INSERTION CONTINUOUS AMBULATORY PERITONEAL DIALYSIS  (CAPD) CATHETER WITH OMENTOPEXY;  Surgeon: Leonie Douglas, MD;  Location: [facility] OR;  Service: Vascular;  Laterality: N/A;   CAPD REMOVAL N/A 10/13/2022   Procedure: LAPAROSCOPIC REMOVAL OF CONTINUOUS AMBULATORY PERITONEAL DIALYSIS  (CAPD) CATHETER;  Surgeon: Leonie Douglas, MD;  Location: [facility] OR;  Service: Vascular;  Laterality: N/A;   ESOPHAGOGASTRODUODENOSCOPY (EGD) WITH PROPOFOL N/A 08/10/2021   Procedure: ESOPHAGOGASTRODUODENOSCOPY (EGD) WITH PROPOFOL;  Surgeon: Kathi Der, MD;  Location: [facility];  Service: Gastroenterology;  Laterality: N/A;   ESOPHAGOGASTRODUODENOSCOPY (EGD)  WITH PROPOFOL N/A 08/24/2021   Procedure: ESOPHAGOGASTRODUODENOSCOPY (EGD) WITH PROPOFOL;  Surgeon: Kathi Der, MD;  Location: [facility];  Service: Gastroenterology;  Laterality: N/A;   HERNIA REPAIR     IR FLUORO GUIDED NEEDLE PLC ASPIRATION/INJECTION LOC  07/28/2021   LUMBAR LAMINECTOMY/DECOMPRESSION MICRODISCECTOMY N/A 08/07/2021   Procedure: THORACIC FOUR - THORACIC FIVE LAMINECTOMY/DECOMPRESSION OF SPINAL CORD, DEBRIDEMENT OF ABSCESS, MICRODISCECTOMY, INTRAOPERATIVE ULTRASOUND;  Surgeon: Lisbeth Renshaw, MD;  Location: [facility] OR;  Service: Neurosurgery;  Laterality: N/A;   TEE WITHOUT CARDIOVERSION N/A 07/29/2021   Procedure: TRANSESOPHAGEAL ECHOCARDIOGRAM (TEE);  Surgeon: Jake Bathe, MD;  Location: [facility] ENDOSCOPY;  Service: Cardiovascular;  Laterality: N/A;   TRIGGER FINGER RELEASE Right 10/25/2019   Procedure: RIGHT INDEX FINGER RELEASE TRIGGER FINGER/A-1 PULLEY;  Surgeon: Cindee Salt, MD;  Location: [facility];  Service: Orthopedics;  Laterality: Right;  IV REGIONAL FOREARM BLOCK    Family History family history includes ADD / ADHD in his brother and son; Diabetes in his father and mother; Hyperlipidemia in his brother and mother; Stroke in his brother and mother.  Social History Social History   Socioeconomic History   Marital status: Married    Spouse name: Not on file   Number of children: 2   Years of education: Not on file   Highest education level: Bachelor's degree (e.g., BA, AB, BS)  Occupational History   Occupation: Public house manager: [employer]  Tobacco Use   Smoking status: Former    Current packs/day: 0.00    Average packs/day: 1 pack/day for 20.0 years (20.0 ttl pk-yrs)    Types: Cigarettes    Start date: 07/2000    Quit date: 07/2020    Years since quitting: 3.4   Smokeless tobacco: Never  Vaping Use   Vaping status:  Never Used  Substance and Sexual Activity   Alcohol use: Not Currently   Drug use: Never   Sexual activity: Yes    Partners: Female    Birth control/protection: None    Comment: with monogamous partner  Other Topics Concern   Not on file  Social History Narrative   Pt has lived majority of life in [place]. Lives at home with partner, 2 kids, 8 cats, and 1 dog.    Social Drivers of Corporate investment banker Strain: Low Risk  (11/25/2022)   Received from [facility]   Overall Financial Resource Strain (CARDIA)    Difficulty of Paying Living Expenses: Not hard at all  Food Insecurity: No Food Insecurity (11/06/2023)   Hunger Vital Sign    Worried About Running Out of Food in the Last Year: Never true    Ran Out of Food in the Last Year: Never true  Transportation Needs: No Transportation Needs (11/06/2023)   PRAPARE - Administrator, Civil Service (Medical): No    Lack of Transportation (Non-Medical): No  Physical Activity: Insufficiently Active (05/05/2018)   Exercise Vital Sign    Days of Exercise per Week: 4 days    Minutes of Exercise per Session: 20 min  Stress: No Stress Concern Present (12/15/2022)   Received from [facility] [place] - Occupational Stress Questionnaire    Feeling of Stress : Only a little  Social Connections: Unknown (11/10/2022)   Received from [facility]   Social Network    Social Network: Not on file  Intimate Partner Violence: Not At Risk (11/06/2023)  Humiliation, Afraid, Rape, and Kick questionnaire    Fear of Current or Ex-Partner: No    Emotionally Abused: No    Physically Abused: No    Sexually Abused: No    Lab Results  Component Value Date   HGBA1C 6.2 (A) 12/26/2023   HGBA1C 6.1 (A) 09/12/2023   HGBA1C 6.8 (A) 05/12/2023   Lab Results  Component Value Date   CHOL 116 11/22/2019   Lab Results  Component Value Date   HDL 42  11/22/2019   Lab Results  Component Value Date   LDLCALC 57 11/22/2019   Lab Results  Component Value Date   TRIG 174 (H) 08/17/2021   Lab Results  Component Value Date   CHOLHDL 2.8 11/22/2019   Lab Results  Component Value Date   CREATININE 5.38 (H) 11/08/2023   No results found for: "GFR" No results found for: "MICROALBUR", "[id number]"    Component Value Date/Time   NA 133 (L) 11/08/2023 0610   NA 141 11/22/2019 1012   K 3.4 (L) 11/08/2023 0610   CL 94 (L) 11/08/2023 0610   CO2 26 11/08/2023 0610   GLUCOSE 179 (H) 11/08/2023 0610   BUN 70 (H) 11/08/2023 0610   BUN 20 11/22/2019 1012   CREATININE 5.38 (H) 11/08/2023 0610   CREATININE 2.48 (H) 12/18/2021 1407   CALCIUM 8.0 (L) 11/08/2023 0610   PROT 6.6 11/06/2023 0945   PROT 6.5 11/22/2019 1012   ALBUMIN 3.2 (L) 11/07/2023 0502   ALBUMIN 4.4 11/22/2019 1012   AST 14 (L) 11/06/2023 0945   ALT 12 11/06/2023 0945   ALKPHOS 73 11/06/2023 0945   BILITOT 1.2 11/06/2023 0945   BILITOT 0.3 11/22/2019 1012   GFRNONAA 12 (L) 11/08/2023 0610   GFRAA 82 11/22/2019 1012      Latest Ref Rng & Units 11/08/2023    6:10 AM 11/07/2023    5:02 AM 11/06/2023   10:08 AM  BMP  Glucose 70 - 99 mg/dL 161  096  045   BUN 6 - 20 mg/dL 70  78  82   Creatinine 0.61 - 1.24 mg/dL 4.09  8.11  9.14   Sodium 135 - 145 mmol/L 133  132  130   Potassium 3.5 - 5.1 mmol/L 3.4  3.7  3.9   Chloride 98 - 111 mmol/L 94  95  94   CO2 22 - 32 mmol/L 26  26    Calcium 8.9 - 10.3 mg/dL 8.0  8.3         Component Value Date/Time   WBC 10.3 11/08/2023 0610   RBC 3.13 (L) 11/08/2023 0610   HGB 9.7 (L) 11/08/2023 0610   HGB 13.3 05/05/2018 1211   HCT 26.1 (L) 11/08/2023 0610   HCT 40.2 05/05/2018 1211   PLT 222 11/08/2023 0610   PLT 272 05/05/2018 1211   MCV 83.4 11/08/2023 0610   MCV 88 05/05/2018 1211   MCH 31.0 11/08/2023 0610   MCHC 37.2 (H) 11/08/2023 0610   RDW 11.5 11/08/2023 0610   RDW 13.2 05/05/2018 1211   LYMPHSABS 1.3 11/06/2023  0945   LYMPHSABS 2.4 05/05/2018 1211   MONOABS 2.2 (H) 11/06/2023 0945   EOSABS 0.0 11/06/2023 0945   EOSABS 0.2 05/05/2018 1211   BASOSABS 0.0 11/06/2023 0945   BASOSABS 0.0 05/05/2018 1211     Parts of this note may have been dictated using voice recognition software. There may be variances in spelling and vocabulary which are unintentional. Not all errors are proofread. Please  notify the Thereasa Parkin if any discrepancies are noted or if the meaning of any statement is not clear.

## 2023-12-26 NOTE — Patient Instructions (Signed)

## 2023-12-29 ENCOUNTER — Encounter: Payer: Self-pay | Admitting: "Endocrinology

## 2024-03-30 ENCOUNTER — Ambulatory Visit: Admitting: "Endocrinology
